# Patient Record
Sex: Female | Born: 1965 | Race: White | Hispanic: No | Marital: Married | Smoking: Former smoker
Health system: Southern US, Community
[De-identification: ages and names within clinical notes are randomized; demographics above are authoritative.]

## PROBLEM LIST (undated history)

## (undated) ENCOUNTER — Ambulatory Visit: Admission: EM | Payer: BC Managed Care – PPO | Source: Home / Self Care

## (undated) DIAGNOSIS — J309 Allergic rhinitis, unspecified: Secondary | ICD-10-CM

## (undated) DIAGNOSIS — G43909 Migraine, unspecified, not intractable, without status migrainosus: Secondary | ICD-10-CM

## (undated) DIAGNOSIS — C801 Malignant (primary) neoplasm, unspecified: Secondary | ICD-10-CM

## (undated) DIAGNOSIS — Z87442 Personal history of urinary calculi: Secondary | ICD-10-CM

## (undated) DIAGNOSIS — I10 Essential (primary) hypertension: Secondary | ICD-10-CM

## (undated) DIAGNOSIS — M109 Gout, unspecified: Secondary | ICD-10-CM

## (undated) DIAGNOSIS — K219 Gastro-esophageal reflux disease without esophagitis: Secondary | ICD-10-CM

## (undated) HISTORY — DX: Gout, unspecified: M10.9

## (undated) HISTORY — PX: PARTIAL HYSTERECTOMY: SHX80

## (undated) HISTORY — PX: TONSILECTOMY, ADENOIDECTOMY, BILATERAL MYRINGOTOMY AND TUBES: SHX2538

## (undated) HISTORY — PX: VESICOVAGINAL FISTULA CLOSURE W/ TAH: SUR271

## (undated) HISTORY — PX: EYE SURGERY: SHX253

## (undated) HISTORY — DX: Allergic rhinitis, unspecified: J30.9

## (undated) HISTORY — DX: Essential (primary) hypertension: I10

## (undated) HISTORY — PX: BREAST CYST EXCISION: SHX579

## (undated) HISTORY — DX: Gastro-esophageal reflux disease without esophagitis: K21.9

## (undated) HISTORY — DX: Migraine, unspecified, not intractable, without status migrainosus: G43.909

---

## 1996-07-09 HISTORY — PX: OTHER SURGICAL HISTORY: SHX169

## 2001-05-10 ENCOUNTER — Emergency Department (HOSPITAL_COMMUNITY): Admission: EM | Admit: 2001-05-10 | Discharge: 2001-05-10 | Payer: Self-pay | Admitting: *Deleted

## 2001-05-10 ENCOUNTER — Encounter: Payer: Self-pay | Admitting: *Deleted

## 2005-04-13 ENCOUNTER — Emergency Department (HOSPITAL_COMMUNITY): Admission: EM | Admit: 2005-04-13 | Discharge: 2005-04-13 | Payer: Self-pay | Admitting: Emergency Medicine

## 2005-11-08 ENCOUNTER — Ambulatory Visit (HOSPITAL_COMMUNITY): Admission: RE | Admit: 2005-11-08 | Discharge: 2005-11-08 | Payer: Self-pay | Admitting: Family Medicine

## 2006-01-25 ENCOUNTER — Ambulatory Visit (HOSPITAL_COMMUNITY): Admission: RE | Admit: 2006-01-25 | Discharge: 2006-01-25 | Payer: Self-pay | Admitting: General Surgery

## 2006-01-25 ENCOUNTER — Encounter (INDEPENDENT_AMBULATORY_CARE_PROVIDER_SITE_OTHER): Payer: Self-pay | Admitting: Specialist

## 2007-11-10 ENCOUNTER — Ambulatory Visit (HOSPITAL_COMMUNITY): Admission: RE | Admit: 2007-11-10 | Discharge: 2007-11-10 | Payer: Self-pay | Admitting: Nurse Practitioner

## 2007-11-17 ENCOUNTER — Ambulatory Visit (HOSPITAL_COMMUNITY): Admission: RE | Admit: 2007-11-17 | Discharge: 2007-11-17 | Payer: Self-pay | Admitting: Nurse Practitioner

## 2009-10-15 ENCOUNTER — Emergency Department (HOSPITAL_COMMUNITY): Admission: EM | Admit: 2009-10-15 | Discharge: 2009-10-15 | Payer: Self-pay | Admitting: Emergency Medicine

## 2010-03-08 ENCOUNTER — Ambulatory Visit (HOSPITAL_COMMUNITY): Admission: RE | Admit: 2010-03-08 | Discharge: 2010-03-08 | Payer: Self-pay | Admitting: Urology

## 2010-09-27 LAB — URINE CULTURE: Colony Count: NO GROWTH

## 2010-09-27 LAB — URINALYSIS, ROUTINE W REFLEX MICROSCOPIC
Glucose, UA: NEGATIVE mg/dL
Ketones, ur: NEGATIVE mg/dL
Nitrite: NEGATIVE
Protein, ur: NEGATIVE mg/dL

## 2010-09-27 LAB — WET PREP, GENITAL: Yeast Wet Prep HPF POC: NONE SEEN

## 2010-11-24 NOTE — Op Note (Signed)
NAMEJOCHEBED, BILLS               ACCOUNT NO.:  [id number]   MEDICAL RECORD NO.:  [id number]          PATIENT TYPE:  AMB   LOCATION:  DAY                           FACILITY:  [facility]   PHYSICIAN:  Dalia Heading, M.D.  DATE OF BIRTH:  [birth year]   DATE OF PROCEDURE:  01/25/2006  DATE OF DISCHARGE:                                 OPERATIVE REPORT   PREOPERATIVE DIAGNOSIS:  Thrombosed hemorrhoid.   POSTOPERATIVE DIAGNOSIS:  Thrombosed hemorrhoid.   PROCEDURE:  Internal and external hemorrhoidectomy.   SURGEON:  Dr. Franky Macho   ANESTHESIA:  Spinal.   INDICATIONS:  The patient is a 45 year old morbidly obese white female who  presents with a thrombosed internal and external hemorrhoid at the 7 o'clock  position.  The risks and benefits of procedure including bleeding,  infection, recurrence of hemorrhoidal disease were fully explained to the  patient, who gave informed consent.   PROCEDURE NOTE:  The patient was placed in the lithotomy position after  spinal anesthesia was administered.  The perineum was prepped and draped in  the usual sterile technique with Betadine.  Surgical site confirmation was  performed.   The patient was noted to have mild circumferential hemorrhoidal disease,  though a large thrombosed internal and external hemorrhoid at 7 o'clock  position was noted with skin excoriation.  The internal and external  hemorrhoid in this region was excised without difficulty.  Mucosal edges  were reapproximated using a 2-0 Vicryl running suture.  0.5 cm Sensorcaine  was instilled in the surrounding wound.  The excision site was inspected.  No abnormal bleeding was noted at the end of the procedure.  Surgicel and  viscous Xylocaine packing was then placed into the rectum.   All tape and needle counts correct at end of the procedure.  The patient was  transferred to PACU in stable condition.  Complications none.   SPECIMEN:  Hemorrhoids.   BLOOD LOSS:   Minimal.      Dalia Heading, M.D.  Electronically Signed     MAJ/MEDQ  D:  01/25/2006  T:  01/25/2006  Job:  [id number]   cc:   Ninfa Linden, M.D., Lewayne Bunting

## 2010-11-24 NOTE — H&P (Signed)
Joanna Reid, CROWELL               ACCOUNT NO.:  [id number]   MEDICAL RECORD NO.:  [id number]          PATIENT TYPE:  AMB   LOCATION:  DAY                           FACILITY:  [facility]   PHYSICIAN:  Dalia Heading, M.D.  DATE OF BIRTH:  05-23-1966   DATE OF ADMISSION:  DATE OF DISCHARGE:  LH                                HISTORY & PHYSICAL   CHIEF COMPLAINT:  Thrombosed hemorrhoids.   HISTORY OF PRESENT ILLNESS:  The patient is a 45 year old white female who  is referred for evaluation and treatment hemorrhoidal disease.  This has  been present for many months and medications have not been helpful.  She had  them during her pregnancy.  They are painful to touch.  No constipation or  diarrhea is noted.  Some blood is noted on the toilet paper when she wipes  herself.   PAST MEDICAL HISTORY:  Includes GERD, hypertension, non-insulin dependent  diabetes mellitus.   PAST SURGICAL HISTORY:  Hysterectomy, C-section.   CURRENT MEDICATIONS:  Omeprazole, Benicar, metformin, bumetanide, Byetta,  Simvastatin.   ALLERGIES:  NO KNOWN DRUG ALLERGIES.   REVIEW OF SYSTEMS:  Noncontributory.   PHYSICAL EXAMINATION:  GENERAL:  The patient is an obese white female in no acute distress.  LUNGS:  Clear to auscultation with equal breath sounds bilaterally.  HEART: Examination reveals regular rate and rhythm without S3, S4, or  murmurs.  RECTAL:  Examination reveals thrombosed hemorrhoids at the 7 o'clock  position.  Examination is limited secondary to pain.   IMPRESSION:  Thrombosed hemorrhoid.   PLAN:  The patient is scheduled for hemorrhoidectomy on January 25, 2006.  The  risks and benefits of the procedure including bleeding, infection,  recurrence of the hemorrhoids were fully explained to the patient who gave  informed consent.      Dalia Heading, M.D.  Electronically Signed     MAJ/MEDQ  D:  01/24/2006  T:  01/24/2006  Job:  [id number]   cc:   Ninfa Linden, M.D.

## 2010-12-04 ENCOUNTER — Emergency Department (HOSPITAL_COMMUNITY)
Admission: EM | Admit: 2010-12-04 | Discharge: 2010-12-04 | Disposition: A | Discharge status: Home / Self Care | Payer: BC Managed Care – PPO | Attending: Emergency Medicine | Admitting: Emergency Medicine

## 2010-12-04 ENCOUNTER — Emergency Department (HOSPITAL_COMMUNITY): Payer: BC Managed Care – PPO

## 2010-12-04 DIAGNOSIS — M542 Cervicalgia: Secondary | ICD-10-CM | POA: Insufficient documentation

## 2010-12-04 DIAGNOSIS — M1990 Unspecified osteoarthritis, unspecified site: Secondary | ICD-10-CM | POA: Insufficient documentation

## 2010-12-04 DIAGNOSIS — M436 Torticollis: Secondary | ICD-10-CM | POA: Insufficient documentation

## 2010-12-04 DIAGNOSIS — E119 Type 2 diabetes mellitus without complications: Secondary | ICD-10-CM | POA: Insufficient documentation

## 2010-12-04 DIAGNOSIS — M25519 Pain in unspecified shoulder: Secondary | ICD-10-CM | POA: Insufficient documentation

## 2010-12-04 DIAGNOSIS — I10 Essential (primary) hypertension: Secondary | ICD-10-CM | POA: Insufficient documentation

## 2011-09-19 ENCOUNTER — Encounter: Payer: Self-pay | Admitting: Orthopedic Surgery

## 2011-09-19 ENCOUNTER — Ambulatory Visit (INDEPENDENT_AMBULATORY_CARE_PROVIDER_SITE_OTHER): Payer: BC Managed Care – PPO | Admitting: Orthopedic Surgery

## 2011-09-19 VITALS — BP 120/80 | Ht 64.5 in | Wt 272.0 lb

## 2011-09-19 DIAGNOSIS — M722 Plantar fascial fibromatosis: Secondary | ICD-10-CM | POA: Insufficient documentation

## 2011-09-19 NOTE — Patient Instructions (Signed)
You have received a steroid shot. 15% of patients experience increased pain at the injection site with in the next 24 hours. This is best treated with ice and tylenol extra strength 2 tabs every 8 hours. If you are still having pain please call the office.    

## 2011-09-19 NOTE — Progress Notes (Signed)
X-ray report  3 views LEFT foot  Diagnosis plantar fasciitis, heel pain  Spurs are noted on the posterior calcaneus and the plantar portion of the calcaneus.  Degenerative changes in the midfoot.  Impression osteoarthritis midfoot and plantar spur and Achilles spur. Subjective:    Joanna Reid is a 46 y.o. female who presents with LEFT heel pain.  The patient reports "I cannot walk I might heal without experience significant pain  Pain started January of this year suddenly no trauma.  She's been taken ibuprofen, she has a night splint, she uses ice packs of brace and he'll stretching to no avail.  Pain is described as sharp stabbing and burning Pain score 8/10 Timing constant She has restart pain, pain after sitting There's some tingling and some swelling in the heel as well , primarily the pain is on the plantar aspect of the foot  The following portions of the patient's history were reviewed and updated as appropriate: allergies, current medications, past family history, past medical history, past social history, past surgical history and problem list.  Review of Systems A comprehensive review of systems was negative except for: cough    Objective:    BP 120/80  Ht 5' 4.5" (1.638 m)  Wt 272 lb (123.378 kg)  BMI 45.97 kg/m2  Vital signs are stable as recorded  General appearance is normal  The patient is alert and oriented x3  The patient's mood and affect are normal  Gait assessment: limp, left  The cardiovascular exam reveals normal pulses and temperature without edema swelling.  The lymphatic system is negative for palpable lymph nodes  The sensory exam is normal.  There are no pathologic reflexes.  Balance is normal.   Exam of the LEFT foot tenderness on the plantar aspect of the foot and slightly medially but no tenderness over the medial malleolus or posterior tibial tendon.  Range of motion is normal.  Plantar flexion dorsiflexion strength is normal.   Ankle joint is stable.  Achilles Tendon nontender.          Imaging: X-ray of the left foot: she has a plantar spur and an Achilles spur.  There is degenerative arthritis in the foot.    Assessment:    Plantar fasciitis    Plan:    Steroid injection to heel/ plantar fascia insertion performed. PT referral. continue night splint, continue plantar fascia strap.

## 2011-09-19 NOTE — Progress Notes (Signed)
Patient ID: Joanna Reid, female   DOB: Oct 31, 1965, 46 y.o.   MRN: [id number]   LEFT heel injection.  Diagnosis Plantar fasciitis LEFT foot  Verbal consent was obtained.  Timeout was taken  Lateral approach.  The retrocalcaneal bursa was injected.  Medications Depo-Medrol 40 mg per mL-one mL.  Lidocaine 1%-3 cc  Alcohol preparation, ethyl chloride, anesthesia.  The medication was injected without complication

## 2011-09-27 ENCOUNTER — Ambulatory Visit (HOSPITAL_COMMUNITY)
Admission: RE | Admit: 2011-09-27 | Discharge: 2011-09-27 | Disposition: A | Discharge status: Home / Self Care | Payer: BC Managed Care – PPO | Source: Ambulatory Visit | Attending: Orthopedic Surgery | Admitting: Orthopedic Surgery

## 2011-09-27 DIAGNOSIS — IMO0001 Reserved for inherently not codable concepts without codable children: Secondary | ICD-10-CM | POA: Insufficient documentation

## 2011-09-27 DIAGNOSIS — M25673 Stiffness of unspecified ankle, not elsewhere classified: Secondary | ICD-10-CM | POA: Insufficient documentation

## 2011-09-27 DIAGNOSIS — M25579 Pain in unspecified ankle and joints of unspecified foot: Secondary | ICD-10-CM | POA: Insufficient documentation

## 2011-09-27 DIAGNOSIS — M25676 Stiffness of unspecified foot, not elsewhere classified: Secondary | ICD-10-CM | POA: Insufficient documentation

## 2011-09-27 NOTE — Patient Instructions (Addendum)
HEP

## 2011-09-27 NOTE — Evaluation (Signed)
Physical Therapy Evaluation  Patient Details  Name: Joanna Reid MRN: [id number] Date of Birth: 07-07-1966  Today's Date: 09/27/2011 Time: 3244-0102 Time Calculation (min): 44 min  Visit#: 1  of 8   Re-eval: 10/27/11 Assessment Diagnosis: Plantar fascitis Prior Therapy: none  Past Medical History:  Past Medical History  Diagnosis Date  . HBP (high blood pressure)   . Diabetes mellitus    Past Surgical History:  Past Surgical History  Procedure Date  . Tonsilectomy, adenoidectomy, bilateral myringotomy and tubes   . Vesicovaginal fistula closure w/ tah   . Eye surgery     Subjective Symptoms/Limitations Symptoms: [name] states that she began having pain in her left heel since January.  The patient states that she recieved a shot and she recieved 20% relief of pain.  The pain is worse in the evening, it is difficult for her to come sit to stand and just gettng out of the bed is a task sometimes.  She is being referred for therapy  to try to relieve her pain.  She has a night sploing that she has been using since January.   How long can you sit comfortably?: no problem How long can you stand comfortably?: The patient is able to stand for as long as needs because stands on her right leg only.  She is only able to stand on her left leg for 15 minutes. How long can you walk comfortably?: The patient states that she is ambulating on the treadmill.  She walked for 1/4 mile and she was in significant pain.  She has pain almost immediately. Special Tests: She is able to sleep.   Pain Assessment Currently in Pain?: Yes Pain Score:   2 (Highest pain in the past week 25/10; least 2/10) Pain Location: Ankle Pain Orientation: Left Pain Type: Chronic pain Pain Onset: More than a month ago Pain Frequency: Constant Pain Relieving Factors: Pt uses ice and does stair stretch; rolls a frozen waterbottle on her heel.   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 Leisure: Hobbies-yes (Comment) Comments: working in gym now unable to do anything with her feet.  Cognition/Observation Cognition Overall Cognitive Status: Appears within functional limits for tasks assessed Observation/Other Assessments Observations:  (significant antalgic pain.)  Sensation/Coordination/Flexibility/Functional Tests Functional Tests Functional Tests: LEFS= 23/80 Functional Tests: Figure 8 R 57.5 ; L 58.3  Assessment LLE AROM (degrees) Left Ankle Dorsiflexion 0-20: 3  Left Ankle Plantar Flexion 0-45: 52  Left Ankle Inversion: 25  Left Ankle Eversion: 10  LLE Strength Left Ankle Dorsiflexion: 3-/5 Left Ankle Plantar Flexion: 4/5 Left Ankle Inversion: 5/5 Left Ankle Eversion: 5/5  Exercise/Treatments Mobility/Balance      Ankle Stretches Gastroc Stretch: 3 reps;30 seconds Aerobic Exercises   Machines for Strengthening   Ankle Plyometrics   Ankle Exercises - Standing   Ankle Exercises - Seated   Ankle Exercises - Supine   Ankle Exercises - Sidelying   Manual Therapy Manual Therapy: Massage Massage: prone to L gastrroc while on stretch  Physical Therapy Assessment and Plan PT Assessment and Plan Clinical Impression Statement: Pt with plantar fascitis who will benefit from skilled PT to return pt to prior functional level. Rehab Potential: Good Clinical Impairments Affecting Rehab Potential: stiffness, pain, decreased strength, difficulty walking PT Frequency: Min 2X/week PT Duration: 4 weeks PT Treatment/Interventions: Gait training;Therapeutic exercise;Other (comment) (  modalities as needed including iontophoresis w/ dexamethason) PT Plan: See for ex, massage and ionto if md okay's    Goals Home Exercise Program Pt will Perform Home Exercise Program: Independently PT Short Term Goals Time  to Complete Short Term Goals: 2 weeks PT Short Term Goal 1: Pt ROM to 5 degrees to allow normalized walking PT Short Term Goal 2: Pain level to be decreased by 4 levels. PT Long Term Goals Time to Complete Long Term Goals: 4 weeks PT Long Term Goal 1: ROM to be wnl to allow normal walking for 30 min without increase pain. PT Long Term Goal 2: Pain level to be no greater than a 2/10 Long Term Goal 3: I in advance HEP Long Term Goal 4: MM strength wnl to allow normal ADL without increase pain. PT Long Term Goal 5: increase LEFS by 15  Problem List Patient Active Problem List  Diagnoses  . Plantar fasciitis of left foot    PT - End of Session Activity Tolerance: Patient tolerated treatment well General Behavior During Session: [facility] for tasks performed Cognition: [facility] for tasks performed PT Plan of Care Consulted and Agree with Plan of Care: Patient    Dellis Voght,CINDY 09/27/2011, 1:35 PM  Physician Documentation Your signature is required to indicate approval of the treatment plan as stated above.  Please sign and either send electronically or make a copy of this report for your files and return this physician signed original.   Please mark one 1.__approve of plan  2. ___approve of plan with the following conditions.   ______________________________                                                          _____________________ Physician Signature                                                                                                             Date

## 2011-10-03 ENCOUNTER — Ambulatory Visit (HOSPITAL_COMMUNITY)
Admission: RE | Admit: 2011-10-03 | Discharge: 2011-10-03 | Disposition: A | Discharge status: Home / Self Care | Payer: BC Managed Care – PPO | Source: Ambulatory Visit | Attending: Orthopedic Surgery | Admitting: Orthopedic Surgery

## 2011-10-03 NOTE — Progress Notes (Signed)
Physical Therapy Treatment Patient Details  Name: ANISAH KUCK MRN: [id number] Date of Birth: [birth year]  Today's Date: 10/03/2011 Time: 2841-3244 Time Calculation (min): 38 min Visit#: 2  of 8   Re-eval: 10/26/11 Charges:  Therex 8' , ultrasound 8', massage 12'    Subjective: Symptoms/Limitations Symptoms: Pt. states it is feeling better already from the new exercises/stretches instructed last visit. Currently with 2/10 with weight bearing.  Signed order not received from MD yet, refaxed request for ionto. Pain Assessment Currently in Pain?: Yes Pain Score:   2 Pain Location: Foot Pain Orientation: Left   Exercise/Treatments Ankle Stretches Plantar Fascia Stretch: 3 reps;30 seconds Gastroc Stretch: 3 reps;30 seconds   Modalities Modalities: Ultrasound Manual Therapy Manual Therapy: Massage Massage: Prone to L plantar aspect/medial foot X 10' with gastroc stretch Ultrasound Ultrasound Location: L plantar aspect/medial foot Ultrasound Parameters: 1.2 w/cm2, pulsed X 8 minutes. Ultrasound Goals: Pain  Physical Therapy Assessment and Plan PT Assessment and Plan Clinical Impression Statement: Added ultrasound to promote circulation/decrease pain prior to massage.  Pt. reported pain reduction to 2.5/10 after session.  Pt. required verbal cues and demonstration to perform stretches correctly. PT Plan: Begin ionto next visit if order received; continue to reduce pain.     Problem List Patient Active Problem List  Diagnoses  . Plantar fasciitis of left foot    PT - End of Session Activity Tolerance: Patient tolerated treatment well General Behavior During Session: [facility] for tasks performed Cognition: [facility] for tasks performed    Ramzey Petrovic B. Bascom Levels, PTA 10/03/2011, 4:09 PM

## 2011-10-04 ENCOUNTER — Ambulatory Visit (HOSPITAL_COMMUNITY)
Admission: RE | Admit: 2011-10-04 | Discharge: 2011-10-04 | Disposition: A | Discharge status: Home / Self Care | Payer: BC Managed Care – PPO | Source: Ambulatory Visit | Attending: Nurse Practitioner | Admitting: Nurse Practitioner

## 2011-10-04 NOTE — Progress Notes (Addendum)
Physical Therapy Treatment Patient Details  Name: Joanna Reid MRN: [id number] Date of Birth: [birth year]  Today's Date: 10/04/2011 Time: 0810-0900 therex and massage  [id number] ionto treatment Time Calculation (min): 50 min am, 25 min pm Visit#: 3  of 8   Re-eval: 10/26/11 Charges:  therex 8', manual 10', ionto X 1 unit    Subjective: Symptoms/Limitations Symptoms: Pt. states the last treatment flared it up a little; Still waiting for ionto order.  Currently 2/10 with weight bearing activities. Pt. Came back in in PM to receive ionto treatment after order received.   Exercises/massage Instructed/performed by Trilby Leaver, SPTA under the direction of Linsay Vogt Bascom Levels, PTA/CI. Exercise/Treatments Ankle Stretches gastroc stretch 3X30" Plantar fascia stretch 3X30"     Modalities Modalities: Iontophoresis Manual Therapy Manual Therapy: Massage Massage: prone to L achilles tendon/heel X 10 minutes with foot held into gastroc stretch Iontophoresis Type of Iontophoresis: Dexamethasone Location: L plantar aspect/medial foot Dose: 2cc dexamethasone Time: 24'  Physical Therapy Assessment and Plan PT Assessment and Plan Clinical Impression Statement: Focused massage into achilles/gastroc area today with better results.  Pt. to return in pm if orders received for iontophoresis for treatment.  Pt. instructed with a "rolling" technique she can do at home to stretch gastroc muscle. PT Plan: Continue current POC.     Problem List Patient Active Problem List  Diagnoses  . Plantar fasciitis of left foot    PT - End of Session Activity Tolerance: Patient tolerated treatment well General Behavior During Session: [facility] for tasks performed Cognition: [facility] for tasks performed   Trilby Leaver, SPTA/ Neftali Abair B. Bascom Levels, PTA 10/04/2011, 5:12 PM

## 2011-10-08 ENCOUNTER — Ambulatory Visit (HOSPITAL_COMMUNITY)
Admission: RE | Admit: 2011-10-08 | Discharge: 2011-10-08 | Disposition: A | Discharge status: Home / Self Care | Payer: BC Managed Care – PPO | Source: Ambulatory Visit | Attending: Nurse Practitioner | Admitting: Nurse Practitioner

## 2011-10-08 DIAGNOSIS — IMO0001 Reserved for inherently not codable concepts without codable children: Secondary | ICD-10-CM | POA: Insufficient documentation

## 2011-10-08 DIAGNOSIS — M25579 Pain in unspecified ankle and joints of unspecified foot: Secondary | ICD-10-CM | POA: Insufficient documentation

## 2011-10-08 DIAGNOSIS — M25673 Stiffness of unspecified ankle, not elsewhere classified: Secondary | ICD-10-CM | POA: Insufficient documentation

## 2011-10-08 DIAGNOSIS — M25676 Stiffness of unspecified foot, not elsewhere classified: Secondary | ICD-10-CM | POA: Insufficient documentation

## 2011-10-08 NOTE — Progress Notes (Signed)
Physical Therapy Treatment Patient Details  Name: Joanna Reid MRN: [id number] Date of Birth: [birth year]  Today's Date: 10/08/2011 Time: 0805-0900 Time Calculation (min): 55 min Visit#: 4  of 8   Re-eval: 10/26/11 Charges: Therex x 8' Manual x 15' Ionto x 20'  Subjective: Symptoms/Limitations Symptoms: Pt states my foot feel a lot better. Pain Assessment Currently in Pain?: Yes Pain Score:   1 Pain Location: Foot Pain Orientation: Left   Exercise/Treatments Ankle Stretches Plantar Fascia Stretch: 3 reps;30 seconds Slant Board Stretch: 3 reps;30 seconds  Modalities Modalities: Iontophoresis Manual Therapy Manual Therapy: Massage Massage: Prone to L achilles tendon/heel X 15 minutes with foot held into gastroc stretch  Iontophoresis Type of Iontophoresis: Dexamethasone Location: L posterior heel Dose: 2cc dexamethasone Time: 38'  Physical Therapy Assessment and Plan PT Assessment and Plan Clinical Impression Statement: Pt 's pain appears to be decreasing. Multiple spasms and adhesions noted with manual. Began slant board stretch to increase gastroc flexibility. Ionto applied to posterior heel this session as this is where pt is having most of her pain. PT Plan: Continue current POC.     Problem List Patient Active Problem List  Diagnoses  . Plantar fasciitis of left foot    PT - End of Session Activity Tolerance: Patient tolerated treatment well General Behavior During Session: [facility] for tasks performed Cognition: [facility] for tasks performed  Seth Bake, PTA 10/08/2011, 10:44 AM

## 2011-10-09 ENCOUNTER — Ambulatory Visit (HOSPITAL_COMMUNITY)
Admission: RE | Admit: 2011-10-09 | Discharge: 2011-10-09 | Disposition: A | Discharge status: Home / Self Care | Payer: BC Managed Care – PPO | Source: Ambulatory Visit | Attending: Nurse Practitioner | Admitting: Nurse Practitioner

## 2011-10-09 NOTE — Progress Notes (Signed)
Physical Therapy Treatment Patient Details  Name: Joanna Reid MRN: [id number] Date of Birth: 1966-07-03  Today's Date: 10/09/2011 Time: 9147-8295 Time Calculation (min): 77 min Visit#: 5  of 8   Re-eval: 10/26/11 Charges: Therex x 8' Manual x 18' Ionto x 23'  Subjective: Symptoms/Limitations Symptoms: It felt much better after last session. Pain Assessment Currently in Pain?: Yes Pain Score:   1 Pain Location: Foot Pain Orientation: Left   Exercise/Treatments Ankle Stretches Plantar Fascia Stretch: 3 reps;30 seconds Slant Board Stretch: 3 reps;30 seconds    Modalities Modalities: Iontophoresis Manual Therapy Manual Therapy: Massage Massage: Prone to L achilles tendon/heel X 18 minutes with foot held into gastroc stretch Iontophoresis Type of Iontophoresis: Dexamethasone Location: L posterior heel Dose: 2cc dexamethasone Time: 20'  Physical Therapy Assessment and Plan PT Assessment and Plan Clinical Impression Statement: Pt completes stretches without difficulty. Adhesions in plantar fascia appear to have decreased compared to last session. Pt continues to have more sensitivity and pain on posterior heel. Pt reports pain decrease to 0/10 at end of session. PT Plan: Continue to progress per PT POC.     Problem List Patient Active Problem List  Diagnoses  . Plantar fasciitis of left foot    PT - End of Session Activity Tolerance: Patient tolerated treatment well General Behavior During Session: [facility] for tasks performed Cognition: [facility] for tasks performed   Seth Bake, PTA 10/09/2011, 4:57 PM

## 2011-10-16 ENCOUNTER — Ambulatory Visit (HOSPITAL_COMMUNITY): Payer: BC Managed Care – PPO | Admitting: *Deleted

## 2011-10-18 ENCOUNTER — Ambulatory Visit (HOSPITAL_COMMUNITY)
Admission: RE | Admit: 2011-10-18 | Discharge: 2011-10-18 | Disposition: A | Discharge status: Home / Self Care | Payer: BC Managed Care – PPO | Source: Ambulatory Visit | Attending: Nurse Practitioner | Admitting: Nurse Practitioner

## 2011-10-18 NOTE — Progress Notes (Signed)
Physical Therapy Treatment Patient Details  Name: Joanna Reid MRN: [id number] Date of Birth: 03-27-1966  Today's Date: 10/18/2011 Time: 4696-2952 Time Calculation (min): 67 min Visit#: 6  of 8   Re-eval: 10/26/11 Charges:  ionto 23', manual 15, therex 8'    Subjective: Symptoms/Limitations Symptoms: Pt. reports she is doing really well. Pain Assessment Currently in Pain?: Yes Pain Score:   1 Pain Location: Foot Pain Orientation: Left  Exercises Instructed by Trilby Leaver, SPTA under the direction of Maysel Mccolm Bascom Levels, PTA/CI. Exercise/Treatments Ankle Stretches Plantar Fascia Stretch: 3 reps;30 seconds Slant Board Stretch: 3 reps;30 seconds    Modalities Modalities: Iontophoresis Manual Therapy Manual Therapy: Massage Massage: Prone to L achilles tendon/heel X 18 minutes with foot held into gastroc stretch  Ultrasound Ultrasound Goals: Edema Iontophoresis Type of Iontophoresis: Dexamethasone Location: L Medial Arch Dose: 2cc dexamethasone Time: 66'  Physical Therapy Assessment and Plan PT Assessment and Plan Clinical Impression Statement: Pt with decreasing sensitivity and adhesions; responding well to treatment. PT Plan: Continue X 2 more visits.     Problem List Patient Active Problem List  Diagnoses  . Plantar fasciitis of left foot    PT - End of Session Activity Tolerance: Patient tolerated treatment well General Behavior During Session: [facility] for tasks performed Cognition: [facility] for tasks performed   Trilby Leaver, SPTA/ Shaughn Thomley B. Bascom Levels, PTA 10/18/2011, 5:58 PM

## 2011-10-23 ENCOUNTER — Ambulatory Visit (HOSPITAL_COMMUNITY): Payer: BC Managed Care – PPO | Admitting: *Deleted

## 2011-10-25 ENCOUNTER — Ambulatory Visit (HOSPITAL_COMMUNITY): Payer: BC Managed Care – PPO | Admitting: *Deleted

## 2011-10-29 ENCOUNTER — Ambulatory Visit (HOSPITAL_COMMUNITY)
Admission: RE | Admit: 2011-10-29 | Discharge: 2011-10-29 | Disposition: A | Discharge status: Home / Self Care | Payer: BC Managed Care – PPO | Source: Ambulatory Visit | Attending: Nurse Practitioner | Admitting: Nurse Practitioner

## 2011-10-29 NOTE — Progress Notes (Signed)
Physical Therapy Treatment Patient Details  Name: Joanna Reid MRN: [id number] Date of Birth: 03-13-1966  Today's Date: 10/29/2011 Time: 1108-1220 Time Calculation (min): 72 min Visit#: 7  of 8   Charges: MMT x 1 ROMM x 1 Manual x 10' Ionto x 23'  Subjective: Symptoms/Limitations Symptoms: Pt states that it was feeling better but she worked all weekend and now it is tender. Pain Assessment Currently in Pain?: Yes Pain Score:   5 Pain Location: Foot Pain Orientation: Left  Objective:  10/29/11 1122  Functional Tests  Functional Tests LEFS= 56/80  LLE AROM (degrees)  Left Ankle Dorsiflexion 0-20 10   Left Ankle Plantar Flexion 0-45 50   Left Ankle Inversion 20   Left Ankle Eversion 9   LLE Strength  Left Ankle Dorsiflexion 5/5  Left Ankle Plantar Flexion 4/5  Left Ankle Inversion 5/5  Left Ankle Eversion 5/5   Exercise/Treatments Ankle Stretches Slant Board Stretch: 3 reps;30 seconds  Physical Therapy Assessment and Plan PT Assessment and Plan Clinical Impression Statement: Pt tolerates tx well. Pt has made great improvements since initial eval. Pt would like to d/c from PT to continue HEP at home.  PT Plan: Recommend to PT to  D/C to HEP.    Goals Home Exercise Program Pt will Perform Home Exercise Program: Independently PT Short Term Goals Time to Complete Short Term Goals: 2 weeks PT Short Term Goal 1: Pt ROM to 5 degrees to allow normalized walking PT Short Term Goal 1 - Progress: Progressing toward goal PT Short Term Goal 2: Pain level to be decreased by 4 levels. PT Short Term Goal 2 - Progress: Met PT Long Term Goals Time to Complete Long Term Goals: 4 weeks PT Long Term Goal 1: ROM to be wnl to allow normal walking for 30 min without increase pain. PT Long Term Goal 2: Pain level to be no greater than a 2/10 PT Long Term Goal 2 - Progress: Progressing toward goal Long Term Goal 3: I in advance HEP Long Term Goal 3 Progress: Met Long Term Goal 4: MM  strength wnl to allow normal ADL without increase pain. Long Term Goal 4 Progress: Met Long Term Goal 5 Progress: Met  Problem List Patient Active Problem List  Diagnoses  . Plantar fasciitis of left foot    PT - End of Session Activity Tolerance: Patient tolerated treatment well General Behavior During Session: [facility] for tasks performed Cognition: [facility] for tasks performed   Seth Bake, PTA 10/29/2011, 12:21 PM

## 2011-10-31 ENCOUNTER — Ambulatory Visit (HOSPITAL_COMMUNITY): Payer: BC Managed Care – PPO | Admitting: Physical Therapy

## 2011-11-05 ENCOUNTER — Ambulatory Visit (HOSPITAL_COMMUNITY): Payer: BC Managed Care – PPO | Admitting: Physical Therapy

## 2012-05-06 ENCOUNTER — Other Ambulatory Visit: Payer: Self-pay | Admitting: Obstetrics & Gynecology

## 2012-05-06 NOTE — Patient Instructions (Addendum)
20 ZAKYRA KUKUK  05/06/2012   Your procedure is scheduled on:   05/14/2012   Report to [facility] at  730  AM.  Call this number if you have problems the morning of surgery: [phone]   Remember:   Do not eat food:After Midnight.  May have clear liquids:until Midnight .   Take these medicines the morning of surgery with A SIP OF WATER:  Allopurinol,norvasc,cozaar,prilosec   Do not wear jewelry, make-up or nail polish.  Do not wear lotions, powders, or perfumes. You may wear deodorant.  Do not shave 48 hours prior to surgery. Men may shave face and neck.  Do not bring valuables to the hospital.  Contacts, dentures or bridgework may not be worn into surgery.  Leave suitcase in the car. After surgery it may be brought to your room.  For patients admitted to the hospital, checkout time is 11:00 AM the day of discharge.   Patients discharged the day of surgery will not be allowed to drive home.  Name and phone number of your driver: family  Special Instructions: Shower using CHG 2 nights before surgery and the night before surgery.  If you shower the day of surgery use CHG.  Use special wash - you have one bottle of CHG for all showers.  You should use approximately 1/3 of the bottle for each shower.   Please read over the following fact sheets that you were given: Pain Booklet, Coughing and Deep Breathing, MRSA Information, Surgical Site Infection Prevention, Anesthesia Post-op Instructions and Care and Recovery After Surgery Unilateral Salpingo-Oophorectomy Unilateral salpingo-oophorectomy is the removal of one fallopian tube and ovary. The fallopian tubes transport the egg from the ovary to the womb (uterus). The fallopian tube is also where the sperm and egg meet and become fertilized and move down into the uterus.  Removing one tube and ovary will not:  Cause problems with your menstrual periods.  Cause problems with your sex drive (libido).  Put you into the  menopause.  Give symptoms of the menopause.  Make you not able to get pregnant (sterile). There are several reasons for doing a salpingo-oophorectomy:  Infection of the tube and ovary.  There may be scar tissue of the tube and ovary (adhesions).  It may be necessary to remove the tube when the ovary has a cyst or tumor.  It may have to be done when removing the uterus.  It may have to be done when there is cancer of the tube or ovary. LET YOUR CAREGIVER KNOW ABOUT:  Allergies to food or medications.  All the medications you are taking including prescription and over-the-counter herbs, eye drops and creams.  If you are using illegal drugs or excessive alcohol.  Your smoking habits.  Previous problems with anesthesia including numbing medication.  The possibility of being pregnant.  History of blood clots or bleeding problems.  Previous surgery.  Any other medical or health problems. RISKS AND COMPLICATIONS  All surgery is associated with risks. Some of these risks are:  Injury to surrounding organs.  Bleeding.  Infection.  Blood clots in the legs or lungs.  Problems with the anesthesia.  The surgery does not help the problem.  Death. BEFORE THE PROCEDURE  Do not take aspirin or blood thinners because it can make you bleed.  Do not eat or drink anything at least 8 hours before the surgery.  Let your caregiver know if you develop a cold or an infection.  If  you are being admitted the day of surgery, arrive at least one hour before the surgery.  Arrange for help when you go home from the hospital.  If you smoke, do not smoke for at least 2 weeks before the surgery. PROCEDURE After being admitted to the hospital, you will change into a hospital gown. Then, you will be given an IV (intravenous) and a medication to relax you. Then, you will be put to sleep with an anesthetic. Any hair on your lower belly (abdomen) will be removed, and a catheter will be  placed in your bladder. The fallopian tube and ovary will be removed either through 2 very small cuts (incisions) or through large incision in the lower abdomen. The blood vessels will be clamped and tied. AFTER THE PROCEDURE  You will be taken to the recovery room for 1 to 3 hours until your blood pressure, pulse and temperature are stable and you are waking up.  If you had a laparoscopy, you may be discharged in several hours.  If you had a large incision, you will be admitted to the hospital for a day or two.  If you had a laparoscopy, you may have shoulder pain. This is not unusual. It is from air that is left in the abdomen and affects the nerve that goes from the diaphragm to the shoulder. It goes away in a day or two.  You will be given pain medication as necessary.  The intravenous and catheter will be removed before you are discharged.  Have someone available to take you home. HOME CARE INSTRUCTIONS   It is normal to be sore for a week or two. Call your caregiver if the pain is getting worse or the pain medication is not helping.  Have help when you go home for a week or so to help with the household chores.  Follow your caregiver's advice regarding diet.  Get rest and sleep.  Only take over-the-counter or prescription medicines for pain or discomfort as directed by your caregiver.  Do not take aspirin. It can cause bleeding.  Do not drive, exercise or lift anything over 5 pounds.  Do not drink alcohol until your caregiver gives you permission.  Do not lift anything over 5 pounds.  Do not have sexual intercourse until your caregiver says it is OK.  Take your temperature twice a day and write it down.  Change the bandage (dressing) as directed.  Make and keep your follow-up appointments for postoperative care.  If you become constipated, ask your caregiver about taking a mild laxative. Drinking more liquids than usual and eating bran foods can help prevent  constipation. SEEK MEDICAL CARE IF:   You have swelling or redness around the cut (incision).  You develop a rash.  You have side effects from the medication.  You feel lightheaded.  You need more or stronger medication.  You have pain, swelling or redness where the IV (intravenous) was placed. SEEK IMMEDIATE MEDICAL CARE IF:   You develop an unexplained temperature above 100 F (37.8 C).  You develop increasing belly (abdominal) pain.  You have pus coming out of the incision.  You notice a bad smell coming from the wound or dressing.  The incision is separating.  There is excessive vaginal bleeding.  You start to feel sick to your stomach (nauseous) and vomit.  You have leg or chest pain.  You have pain when you urinate.  You develop shortness of breath.  You pass out. Document Released: 04/22/2009  Document Revised: 09/17/2011 Document Reviewed: 04/22/2009 [facility] Patient Information 2013 [place], [place]. PATIENT INSTRUCTIONS POST-ANESTHESIA  IMMEDIATELY FOLLOWING SURGERY:  Do not drive or operate machinery for the first twenty four hours after surgery.  Do not make any important decisions for twenty four hours after surgery or while taking narcotic pain medications or sedatives.  If you develop intractable nausea and vomiting or a severe headache please notify your doctor immediately.  FOLLOW-UP:  Please make an appointment with your surgeon as instructed. You do not need to follow up with anesthesia unless specifically instructed to do so.  WOUND CARE INSTRUCTIONS (if applicable):  Keep a dry clean dressing on the anesthesia/puncture wound site if there is drainage.  Once the wound has quit draining you may leave it open to air.  Generally you should leave the bandage intact for twenty four hours unless there is drainage.  If the epidural site drains for more than 36-48 hours please call the anesthesia department.  QUESTIONS?:  Please feel free to call your  physician or the hospital operator if you have any questions, and they will be happy to assist you.

## 2012-05-07 ENCOUNTER — Other Ambulatory Visit (HOSPITAL_COMMUNITY): Payer: Self-pay

## 2012-05-07 ENCOUNTER — Encounter (HOSPITAL_COMMUNITY): Payer: Self-pay | Admitting: Pharmacy Technician

## 2012-05-07 ENCOUNTER — Encounter (HOSPITAL_COMMUNITY)
Admission: RE | Admit: 2012-05-07 | Discharge: 2012-05-07 | Disposition: A | Discharge status: Home / Self Care | Payer: BC Managed Care – PPO | Source: Ambulatory Visit | Attending: Obstetrics & Gynecology | Admitting: Obstetrics & Gynecology

## 2012-05-07 ENCOUNTER — Encounter (HOSPITAL_COMMUNITY): Payer: Self-pay

## 2012-05-07 LAB — SURGICAL PCR SCREEN
MRSA, PCR: NEGATIVE
Staphylococcus aureus: NEGATIVE

## 2012-05-07 LAB — CBC
HCT: 36.1 % (ref 36.0–46.0)
Hemoglobin: 12.2 g/dL (ref 12.0–15.0)
RBC: 4.25 MIL/uL (ref 3.87–5.11)
WBC: 7.5 10*3/uL (ref 4.0–10.5)

## 2012-05-07 LAB — URINALYSIS, ROUTINE W REFLEX MICROSCOPIC
Bilirubin Urine: NEGATIVE
Ketones, ur: NEGATIVE mg/dL
Specific Gravity, Urine: 1.02 (ref 1.005–1.030)
Urobilinogen, UA: 0.2 mg/dL (ref 0.0–1.0)

## 2012-05-07 LAB — HCG, QUANTITATIVE, PREGNANCY: hCG, Beta Chain, Quant, S: <1 m[IU]/mL (ref <5)

## 2012-05-07 LAB — COMPREHENSIVE METABOLIC PANEL
ALT: 17 U/L (ref 0–35)
Alkaline Phosphatase: 70 U/L (ref 39–117)
BUN: 19 mg/dL (ref 6–23)
CO2: 24 mEq/L (ref 19–32)
Chloride: 105 mEq/L (ref 96–112)
GFR calc Af Amer: >90 mL/min (ref >90)
GFR calc non Af Amer: 86 mL/min — ABNORMAL LOW (ref >90)
Glucose, Bld: 134 mg/dL — ABNORMAL HIGH (ref 70–99)
Potassium: 3.5 mEq/L (ref 3.5–5.1)
Sodium: 140 mEq/L (ref 135–145)
Total Bilirubin: 0.4 mg/dL (ref 0.3–1.2)
Total Protein: 6.9 g/dL (ref 6.0–8.3)

## 2012-05-07 LAB — URINE MICROSCOPIC-ADD ON

## 2012-05-10 LAB — URINE CULTURE

## 2012-05-14 ENCOUNTER — Ambulatory Visit (HOSPITAL_COMMUNITY)
Admission: RE | Admit: 2012-05-14 | Discharge: 2012-05-14 | Disposition: A | Discharge status: Home / Self Care | Payer: BC Managed Care – PPO | Source: Ambulatory Visit | Attending: Obstetrics & Gynecology | Admitting: Obstetrics & Gynecology

## 2012-05-14 ENCOUNTER — Encounter (HOSPITAL_COMMUNITY): Payer: Self-pay | Admitting: *Deleted

## 2012-05-14 ENCOUNTER — Ambulatory Visit (HOSPITAL_COMMUNITY): Payer: BC Managed Care – PPO | Admitting: Anesthesiology

## 2012-05-14 ENCOUNTER — Encounter (HOSPITAL_COMMUNITY)
Admission: RE | Disposition: A | Discharge status: Home / Self Care | Payer: Self-pay | Source: Ambulatory Visit | Attending: Obstetrics & Gynecology

## 2012-05-14 ENCOUNTER — Encounter (HOSPITAL_COMMUNITY): Payer: Self-pay | Admitting: Anesthesiology

## 2012-05-14 ENCOUNTER — Ambulatory Visit: Admit: 2012-05-14 | Payer: Self-pay | Admitting: Obstetrics & Gynecology

## 2012-05-14 DIAGNOSIS — Z01812 Encounter for preprocedural laboratory examination: Secondary | ICD-10-CM | POA: Insufficient documentation

## 2012-05-14 DIAGNOSIS — Z90721 Acquired absence of ovaries, unilateral: Secondary | ICD-10-CM

## 2012-05-14 DIAGNOSIS — N839 Noninflammatory disorder of ovary, fallopian tube and broad ligament, unspecified: Secondary | ICD-10-CM | POA: Insufficient documentation

## 2012-05-14 DIAGNOSIS — D279 Benign neoplasm of unspecified ovary: Secondary | ICD-10-CM | POA: Insufficient documentation

## 2012-05-14 DIAGNOSIS — E119 Type 2 diabetes mellitus without complications: Secondary | ICD-10-CM | POA: Insufficient documentation

## 2012-05-14 DIAGNOSIS — I10 Essential (primary) hypertension: Secondary | ICD-10-CM | POA: Insufficient documentation

## 2012-05-14 HISTORY — PX: LAPAROSCOPIC UNILATERAL SALPINGO OOPHERECTOMY: SHX5935

## 2012-05-14 SURGERY — SALPINGO-OOPHORECTOMY, LAPAROSCOPIC
Anesthesia: General | Site: Vagina | Laterality: Right

## 2012-05-14 SURGERY — SALPINGO-OOPHORECTOMY, UNILATERAL, LAPAROSCOPIC
Anesthesia: General | Laterality: Right | Wound class: Clean

## 2012-05-14 MED ORDER — FENTANYL CITRATE 0.05 MG/ML IJ SOLN
INTRAMUSCULAR | Status: AC
  Filled 2012-05-14: qty 2

## 2012-05-14 MED ORDER — LIDOCAINE HCL (PF) 1 % IJ SOLN
INTRAMUSCULAR | Status: AC
  Filled 2012-05-14: qty 5

## 2012-05-14 MED ORDER — MIDAZOLAM HCL 5 MG/5ML IJ SOLN
INTRAMUSCULAR | Status: DC | PRN
  Administered 2012-05-14: 2 mg via INTRAVENOUS

## 2012-05-14 MED ORDER — MIDAZOLAM HCL 2 MG/2ML IJ SOLN
1.0000 mg | INTRAMUSCULAR | Status: DC | PRN
  Administered 2012-05-14: 2 mg via INTRAVENOUS

## 2012-05-14 MED ORDER — ROCURONIUM BROMIDE 50 MG/5ML IV SOLN
INTRAVENOUS | Status: AC
  Filled 2012-05-14: qty 1

## 2012-05-14 MED ORDER — ONDANSETRON HCL 8 MG PO TABS: 8.0000 mg | ORAL_TABLET | Freq: Three times a day (TID) | ORAL | Status: DC | PRN

## 2012-05-14 MED ORDER — HYDROCODONE-ACETAMINOPHEN 5-500 MG PO TABS: 1.0000 | ORAL_TABLET | Freq: Four times a day (QID) | ORAL | Status: DC | PRN

## 2012-05-14 MED ORDER — KETOROLAC TROMETHAMINE 30 MG/ML IJ SOLN
30.0000 mg | Freq: Once | INTRAMUSCULAR | Status: AC
  Administered 2012-05-14: 30 mg via INTRAVENOUS

## 2012-05-14 MED ORDER — FENTANYL CITRATE 0.05 MG/ML IJ SOLN
INTRAMUSCULAR | Status: DC | PRN
  Administered 2012-05-14: 100 ug via INTRAVENOUS

## 2012-05-14 MED ORDER — LIDOCAINE HCL (CARDIAC) 10 MG/ML IV SOLN
INTRAVENOUS | Status: DC | PRN
  Administered 2012-05-14: 20 mg via INTRAVENOUS

## 2012-05-14 MED ORDER — KETOROLAC TROMETHAMINE 30 MG/ML IJ SOLN
INTRAMUSCULAR | Status: AC
  Filled 2012-05-14: qty 1

## 2012-05-14 MED ORDER — ROCURONIUM BROMIDE 100 MG/10ML IV SOLN
INTRAVENOUS | Status: DC | PRN
  Administered 2012-05-14: 50 mg via INTRAVENOUS

## 2012-05-14 MED ORDER — NEOSTIGMINE METHYLSULFATE 1 MG/ML IJ SOLN
INTRAMUSCULAR | Status: DC | PRN
  Administered 2012-05-14: 2 mg via INTRAVENOUS

## 2012-05-14 MED ORDER — CEFAZOLIN SODIUM-DEXTROSE 2-3 GM-% IV SOLR
2.0000 g | INTRAVENOUS | Status: AC
  Administered 2012-05-14: 2 g via INTRAVENOUS

## 2012-05-14 MED ORDER — SODIUM CHLORIDE 0.9 % IR SOLN
Status: DC | PRN
  Administered 2012-05-14: 3000 mL

## 2012-05-14 MED ORDER — FENTANYL CITRATE 0.05 MG/ML IJ SOLN
25.0000 ug | INTRAMUSCULAR | Status: DC | PRN
  Administered 2012-05-14: 50 ug via INTRAVENOUS

## 2012-05-14 MED ORDER — ONDANSETRON HCL 4 MG/2ML IJ SOLN
INTRAMUSCULAR | Status: DC | PRN
  Administered 2012-05-14: 4 mg via INTRAVENOUS

## 2012-05-14 MED ORDER — 0.9 % SODIUM CHLORIDE (POUR BTL) OPTIME
TOPICAL | Status: DC | PRN
  Administered 2012-05-14: 1000 mL

## 2012-05-14 MED ORDER — GLYCOPYRROLATE 0.2 MG/ML IJ SOLN
INTRAMUSCULAR | Status: AC
  Filled 2012-05-14: qty 2

## 2012-05-14 MED ORDER — ONDANSETRON HCL 4 MG/2ML IJ SOLN: 4.0000 mg | Freq: Once | INTRAMUSCULAR | Status: DC | PRN

## 2012-05-14 MED ORDER — PROPOFOL 10 MG/ML IV BOLUS
INTRAVENOUS | Status: DC | PRN
  Administered 2012-05-14: 150 mg via INTRAVENOUS
  Administered 2012-05-14: 30 mg via INTRAVENOUS

## 2012-05-14 MED ORDER — CEFAZOLIN SODIUM-DEXTROSE 2-3 GM-% IV SOLR
INTRAVENOUS | Status: AC
  Filled 2012-05-14: qty 50

## 2012-05-14 MED ORDER — ONDANSETRON HCL 4 MG/2ML IJ SOLN
INTRAMUSCULAR | Status: AC
  Filled 2012-05-14: qty 2

## 2012-05-14 MED ORDER — KETOROLAC TROMETHAMINE 10 MG PO TABS: 10.0000 mg | ORAL_TABLET | Freq: Three times a day (TID) | ORAL | Status: DC | PRN

## 2012-05-14 MED ORDER — LACTATED RINGERS IV SOLN
INTRAVENOUS | Status: DC
  Administered 2012-05-14: 1000 mL via INTRAVENOUS

## 2012-05-14 MED ORDER — PROPOFOL 10 MG/ML IV EMUL
INTRAVENOUS | Status: AC
  Filled 2012-05-14: qty 20

## 2012-05-14 MED ORDER — GLYCOPYRROLATE 0.2 MG/ML IJ SOLN
INTRAMUSCULAR | Status: DC | PRN
  Administered 2012-05-14: 0.4 mg via INTRAVENOUS

## 2012-05-14 MED ORDER — MIDAZOLAM HCL 2 MG/2ML IJ SOLN
INTRAMUSCULAR | Status: AC
  Filled 2012-05-14: qty 2

## 2012-05-14 SURGICAL SUPPLY — 37 items
APPLIER CLIP UNV 5X34 EPIX (ENDOMECHANICALS) ×1 IMPLANT
APR XCLPCLP 20M/L UNV 34X5 (ENDOMECHANICALS) ×1
BAG HAMPER (MISCELLANEOUS) ×2 IMPLANT
BAG SPEC RTRVL LRG 6X4 10 (ENDOMECHANICALS) ×1
BLADE SURG SZ11 CARB STEEL (BLADE) ×2 IMPLANT
CLOTH BEACON ORANGE TIMEOUT ST (SAFETY) ×2 IMPLANT
COVER LIGHT HANDLE STERIS (MISCELLANEOUS) ×4 IMPLANT
ELECT REM PT RETURN 9FT ADLT (ELECTROSURGICAL) ×2
ELECTRODE REM PT RTRN 9FT ADLT (ELECTROSURGICAL) ×1 IMPLANT
FILTER SMOKE EVAC LAPAROSHD (FILTER) ×2 IMPLANT
FORMALIN 10 PREFIL 480ML (MISCELLANEOUS) ×2 IMPLANT
GLOVE BIOGEL PI IND STRL 8 (GLOVE) ×1 IMPLANT
GLOVE BIOGEL PI INDICATOR 8 (GLOVE) ×1
GLOVE ECLIPSE 6.5 STRL STRAW (GLOVE) ×1 IMPLANT
GLOVE ECLIPSE 8.0 STRL XLNG CF (GLOVE) ×2 IMPLANT
GLOVE EXAM NITRILE MD LF STRL (GLOVE) ×3 IMPLANT
GLOVE INDICATOR 7.0 STRL GRN (GLOVE) ×1 IMPLANT
GOWN STRL REIN XL XLG (GOWN DISPOSABLE) ×4 IMPLANT
INST SET LAPROSCOPIC GYN AP (KITS) ×2 IMPLANT
IV NS IRRIG 3000ML ARTHROMATIC (IV SOLUTION) ×1 IMPLANT
KIT ROOM TURNOVER APOR (KITS) ×2 IMPLANT
KIT TROCAR LAP GYN (TROCAR) ×2 IMPLANT
MANIFOLD NEPTUNE II (INSTRUMENTS) ×2 IMPLANT
PACK PERI GYN (CUSTOM PROCEDURE TRAY) ×2 IMPLANT
PAD ARMBOARD 7.5X6 YLW CONV (MISCELLANEOUS) ×2 IMPLANT
POUCH SPECIMEN RETRIEVAL 10MM (ENDOMECHANICALS) ×1 IMPLANT
SCALPEL HARMONIC ACE (MISCELLANEOUS) ×2 IMPLANT
SET BASIN LINEN APH (SET/KITS/TRAYS/PACK) ×2 IMPLANT
SET TUBE IRRIG SUCTION NO TIP (IRRIGATION / IRRIGATOR) ×1 IMPLANT
SOLUTION ANTI FOG 6CC (MISCELLANEOUS) ×2 IMPLANT
SPONGE GAUZE 2X2 8PLY STRL LF (GAUZE/BANDAGES/DRESSINGS) ×3 IMPLANT
STAPLER VISISTAT 35W (STAPLE) ×2 IMPLANT
SUT VICRYL 0 UR6 27IN ABS (SUTURE) ×3 IMPLANT
TAPE CLOTH SURG 4X10 WHT LF (GAUZE/BANDAGES/DRESSINGS) ×1 IMPLANT
TRAY FOLEY CATH 14FR (SET/KITS/TRAYS/PACK) ×2 IMPLANT
TUBING HI FLO HEAT INSUFFLATOR (IRRIGATION / IRRIGATOR) ×2 IMPLANT
WARMER LAPAROSCOPE (MISCELLANEOUS) ×2 IMPLANT

## 2012-05-14 NOTE — Op Note (Signed)
Preoperative diagnosis:  1.  Complex right ovarian mass, heavily calcified and multicystic                                         2.  normal preoperative CA 125                                         3.  RLQ pain  Postoperative diagnosis:  Same as above  Procedure:  Laparoscopic right salpingo-oophorectomy  Surgeon:  Rockne Coons MD  Anesthesia:  Gen. Endotracheal  Findings:   Patient presented compaining of RLQ for a couple of months, worse with certain movements including intercourse.  Sonogram revealed a 6 cm right multicystic complex mass.   Her preoperative CA 125 was normal.  The left ovary was normal on sonogram and at the time of surgery.   Thye mas did not suppress with megestrol.  As a result the patient has opted for definitive surgical management.  Intraoperatively the preoperative findings were confirmed.    Description of operation:  The patient was taken to the operating room and placed in the supine position where she underwent general endotracheal anesthesia.  She was placed in the low lithotomy position.  She was prepped and draped in the usual sterile fashion and a Foley catheter was placed.  An incision was made in the umbilicus and a Veres needle was placed into the peritoneal cavity with one pass without difficulty.  The peritoneal cavity was then insufflated.  An 11 mm non-bladed direct visualization trocar was then used and placed into the peritoneal cavity with direct laparoscopic visualization again without difficulty.  Incisions were then made in the right lower quadrant and suprapubic region in the midline.  A 5 mm trocar was placed in both incisions.  Both were done under direct visualization without difficulty.  The right ovary was grasped.  The ovary and tube were densely adherent to the right pelvic sidewall and also to the epiploici of the small bowel and rectosigmoid.  The harmonic scalpel was used and the infundibulopelvic ligament on the right was coagulated  and then transected.   Great care was taken to avoid ureteral injury as the mass was so densely adherent all the way down.  There was good peristalsis of the ureter after liberation of the right adnexal complex. He remaining peritoneal attachments of the right ovary were also taken down hemostatically.    An Endo Catch was placed and  the right ovary was put in the bag and removed through the umbilical  incision without difficulty.  I did have to extend the fascial and skin incision just a bit. The umbilical fascia was closed using 0 vicryl. The subcutaneous tissue was then reapproximated with 0 Vicryl. The skin was closed using skin staples.    The patient received Ancef  preoperatively.  She also received Toradol IV preoperatively.  She will be discharged from the recovery room time and seen next week for staple and suture removal.  Rodrigo Mcgranahan H 05/14/2012 10:18 AM

## 2012-05-14 NOTE — Anesthesia Postprocedure Evaluation (Signed)
Anesthesia Post Note  Patient: Joanna Reid  Procedure(s) Performed: Procedure(s) (LRB): LAPAROSCOPIC UNILATERAL SALPINGO OOPHORECTOMY (Right)  Anesthesia type: General  Patient location: PACU  Post pain: Pain level controlled  Post assessment: Post-op Vital signs reviewed, Patient's Cardiovascular Status Stable, Respiratory Function Stable, Patent Airway, No signs of Nausea or vomiting and Pain level controlled  Last Vitals:  Filed Vitals:   05/14/12 1024  BP: 133/100  Pulse: 74  Temp: 36.8 C  Resp: 12    Post vital signs: Reviewed and stable  Level of consciousness: awake and alert   Complications: No apparent anesthesia complications

## 2012-05-14 NOTE — H&P (Signed)
Joanna Reid is an 46 y.o. female. 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  Her CA 125 was normal and it did not suppress with megestrol.  Additionally her pain has continued throughout.  She desires definitive therapy because of the pain.    Past Medical History  Diagnosis Date  . HBP (high blood pressure)   . Diabetes mellitus     Past Surgical History  Procedure Date  . Tonsilectomy, adenoidectomy, bilateral myringotomy and tubes   . Vesicovaginal fistula closure w/ tah   . Eye surgery   . Kidney stones 1998    Family History  Problem Relation Age of Onset  . Arthritis    . Cancer    . Diabetes      Social History:  reports that she has never smoked. She does not have any smokeless tobacco history on file. She reports that she does not drink alcohol or use illicit drugs.  Allergies: No Known Allergies  Prescriptions prior to admission  Medication Sig Dispense Refill  . allopurinol (ZYLOPRIM) 300 MG tablet Take 300 mg by mouth daily.       Marland Kitchen amLODipine (NORVASC) 5 MG tablet Take 5 mg by mouth daily.      . bumetanide (BUMEX) 1 MG tablet Take 1 mg by mouth daily.       . fluticasone (FLONASE) 50 MCG/ACT nasal spray Place 2 sprays into the nose 2 (two) times daily.      Marland Kitchen losartan (COZAAR) 50 MG tablet Take 50 mg by mouth daily.       . metFORMIN (GLUCOPHAGE) 500 MG tablet Take 500 mg by mouth daily with breakfast.       . omeprazole (PRILOSEC) 20 MG capsule Take 20 mg by mouth daily.       Marland Kitchen topiramate (TOPAMAX) 50 MG tablet Take 50 mg by mouth 2 (two) times daily.        ROS  Review of Systems  Constitutional: Negative for fever, chills, weight loss, malaise/fatigue and diaphoresis.  HENT: Negative for hearing loss, ear pain, nosebleeds, congestion, sore throat, neck pain, tinnitus and ear discharge.   Eyes: Negative for blurred vision, double vision, photophobia, pain, discharge and redness.  Respiratory: Negative  for cough, hemoptysis, sputum production, shortness of breath, wheezing and stridor.   Cardiovascular: Negative for chest pain, palpitations, orthopnea, claudication, leg swelling and PND.  Gastrointestinal: Negative for abdominal pain. Negative for heartburn, nausea, vomiting, diarrhea, constipation, blood in stool and melena.  Genitourinary: Negative for dysuria, urgency, frequency, hematuria and flank pain.  Musculoskeletal: Negative for myalgias, back pain, joint pain and falls.  Skin: Negative for itching and rash.  Neurological: Negative for dizziness, tingling, tremors, sensory change, speech change, focal weakness, seizures, loss of consciousness, weakness and headaches.  Endo/Heme/Allergies: Negative for environmental allergies and polydipsia. Does not bruise/bleed easily.  Psychiatric/Behavioral: Negative for depression, suicidal ideas, hallucinations, memory loss and substance abuse. The patient is not nervous/anxious and does not have insomnia.     Blood pressure 128/85, pulse 69, temperature 98.2 F (36.8 C), resp. rate 13, SpO2 99.00%. Physical Exam Physical Exam  Vitals reviewed. Constitutional: She is oriented to person, place, and time. She appears well-developed and well-nourished.  HENT:  Head: Normocephalic and atraumatic.  Right Ear: External ear normal.  Left Ear: External ear normal.  Nose: Nose normal.  Mouth/Throat: Oropharynx is clear and moist.  Eyes: Conjunctivae and EOM are normal. Pupils are equal, round, and reactive to light.  Right eye exhibits no discharge. Left eye exhibits no discharge. No scleral icterus.  Neck: Normal range of motion. Neck supple. No tracheal deviation present. No thyromegaly present.  Cardiovascular: Normal rate, regular rhythm, normal heart sounds and intact distal pulses.  Exam reveals no gallop and no friction rub.   No murmur heard. Respiratory: Effort normal and breath sounds normal. No respiratory distress. She has no wheezes.  She has no rales. She exhibits no tenderness.  GI: Soft. Bowel sounds are normal. She exhibits no distension and no mass. There is tenderness. There is no rebound and no guarding.  Genitourinary:       Vulva is normal without lesions Vagina is pink moist without discharge Cervix normal in appearance and pap is normal Uterus is normal Adnexa is significant for a multicystic 6 cm mass on the right ovary, the left ovary is normal Musculoskeletal: Normal range of motion. She exhibits no edema and no tenderness.  Neurological: She is alert and oriented to person, place, and time. She has normal reflexes. She displays normal reflexes. No cranial nerve deficit. She exhibits normal muscle tone. Coordination normal.  Skin: Skin is warm and dry. No rash noted. No erythema. No pallor.  Psychiatric: She has a normal mood and affect. Her behavior is normal. Judgment and thought content normal.     Recent Results (from the past 336 hour(s))  SURGICAL PCR SCREEN   Collection Time   05/07/12  8:29 AM      Component Value Range   MRSA, PCR NEGATIVE  NEGATIVE   Staphylococcus aureus NEGATIVE  NEGATIVE  URINALYSIS, ROUTINE W REFLEX MICROSCOPIC   Collection Time   05/07/12  8:29 AM      Component Value Range   Color, Urine YELLOW  YELLOW   APPearance CLEAR  CLEAR   Specific Gravity, Urine 1.020  1.005 - 1.030   pH 6.0  5.0 - 8.0   Glucose, UA NEGATIVE  NEGATIVE mg/dL   Hgb urine dipstick TRACE (*) NEGATIVE   Bilirubin Urine NEGATIVE  NEGATIVE   Ketones, ur NEGATIVE  NEGATIVE mg/dL   Protein, ur NEGATIVE  NEGATIVE mg/dL   Urobilinogen, UA 0.2  0.0 - 1.0 mg/dL   Nitrite NEGATIVE  NEGATIVE   Leukocytes, UA TRACE (*) NEGATIVE  URINE MICROSCOPIC-ADD ON   Collection Time   05/07/12  8:29 AM      Component Value Range   Squamous Epithelial / LPF FEW (*) RARE   WBC, UA 3-6  <3 WBC/hpf   RBC / HPF 3-6  <3 RBC/hpf   Bacteria, UA MANY (*) RARE  URINE CULTURE   Collection Time   05/07/12  8:29 AM       Component Value Range   Specimen Description URINE, CLEAN CATCH     Special Requests NONE     Culture  Setup Time 05/07/2012 16:09     Colony Count >=100,000 COLONIES/ML     Culture ESCHERICHIA COLI     Report Status 05/10/2012 FINAL     Organism ID, Bacteria ESCHERICHIA COLI    CBC   Collection Time   05/07/12  8:30 AM      Component Value Range   WBC 7.5  4.0 - 10.5 K/uL   RBC 4.25  3.87 - 5.11 MIL/uL   Hemoglobin 12.2  12.0 - 15.0 g/dL   HCT 16.1  09.6 - 04.5 %   MCV 84.9  78.0 - 100.0 fL   MCH 28.7  26.0 - 34.0 pg  MCHC 33.8  30.0 - 36.0 g/dL   RDW 62.9  52.8 - 41.3 %   Platelets 361  150 - 400 K/uL  COMPREHENSIVE METABOLIC PANEL   Collection Time   05/07/12  8:30 AM      Component Value Range   Sodium 140  135 - 145 mEq/L   Potassium 3.5  3.5 - 5.1 mEq/L   Chloride 105  96 - 112 mEq/L   CO2 24  19 - 32 mEq/L   Glucose, Bld 134 (*) 70 - 99 mg/dL   BUN 19  6 - 23 mg/dL   Creatinine, Ser 2.44  0.50 - 1.10 mg/dL   Calcium 01.0  8.4 - 27.2 mg/dL   Total Protein 6.9  6.0 - 8.3 g/dL   Albumin 3.9  3.5 - 5.2 g/dL   AST 14  0 - 37 U/L   ALT 17  0 - 35 U/L   Alkaline Phosphatase 70  39 - 117 U/L   Total Bilirubin 0.4  0.3 - 1.2 mg/dL   GFR calc non Af Amer 86 (*) >90 mL/min   GFR calc Af Amer >90  >90 mL/min  HCG, QUANTITATIVE, PREGNANCY   Collection Time   05/07/12  8:30 AM      Component Value Range   hCG, Beta Chain, Quant, S <1  <5 mIU/mL       Assessment/Plan: 1.  Multicystic right ovarian mass 2.  Right lower quadrant pain  Patient considered conservative versus surgical options and has chosen surgery due to her pain and the multicystic nature of the mass.  Pt understands the risks of surgery including but not limited t  excessive bleeding requiring transfusion or reoperation, post-operative infection requiring prolonged hospitalization or re-hospitalization and antibiotic therapy, and damage to other organs including bladder, bowel, ureters and major  vessels.  The patient also understands the alternative treatment options which were discussed in full.  All questions were answered.   Jehan Ranganathan H 05/14/2012, 8:39 AM

## 2012-05-14 NOTE — Anesthesia Procedure Notes (Signed)
Procedure Name: Intubation Date/Time: 05/14/2012 9:04 AM Performed by: Franco Nones Pre-anesthesia Checklist: Patient identified, Patient being monitored, Timeout performed, Emergency Drugs available and Suction available Patient Re-evaluated:Patient Re-evaluated prior to inductionOxygen Delivery Method: Circle System Utilized Preoxygenation: Pre-oxygenation with 100% oxygen Intubation Type: IV induction, Rapid sequence and Cricoid Pressure applied Laryngoscope Size: Miller and 2 Grade View: Grade I Tube type: Oral Tube size: 7.0 mm Number of attempts: 1 Airway Equipment and Method: stylet Placement Confirmation: ETT inserted through vocal cords under direct vision,  positive ETCO2 and breath sounds checked- equal and bilateral Secured at: 21 cm Tube secured with: Tape Dental Injury: Teeth and Oropharynx as per pre-operative assessment

## 2012-05-14 NOTE — Anesthesia Preprocedure Evaluation (Signed)
Anesthesia Evaluation  Patient identified by MRN, date of birth, ID band Patient awake    Reviewed: Allergy & Precautions, H&P , NPO status , Patient's Chart, lab work & pertinent test results  History of Anesthesia Complications Negative for: history of anesthetic complications  Airway Mallampati: II TM Distance: >3 FB Neck ROM: Full    Dental  (+) Partial Upper and Teeth Intact   Pulmonary neg pulmonary ROS,  breath sounds clear to auscultation        Cardiovascular hypertension, Pt. on medications Rhythm:Regular Rate:Normal     Neuro/Psych    GI/Hepatic GERD-  Controlled and Medicated,  Endo/Other  diabetes, Well Controlled, Oral Hypoglycemic Agents  Renal/GU      Musculoskeletal   Abdominal   Peds  Hematology   Anesthesia Other Findings   Reproductive/Obstetrics                           Anesthesia Physical Anesthesia Plan  ASA: II  Anesthesia Plan: General   Post-op Pain Management:    Induction: Intravenous, Rapid sequence and Cricoid pressure planned  Airway Management Planned: Oral ETT  Additional Equipment:   Intra-op Plan:   Post-operative Plan: Extubation in OR  Informed Consent: I have reviewed the patients History and Physical, chart, labs and discussed the procedure including the risks, benefits and alternatives for the proposed anesthesia with the patient or authorized representative who has indicated his/her understanding and acceptance.     Plan Discussed with:   Anesthesia Plan Comments:         Anesthesia Quick Evaluation

## 2012-05-14 NOTE — Transfer of Care (Signed)
Immediate Anesthesia Transfer of Care Note  Patient: Joanna Reid  Procedure(s) Performed: Procedure(s) (LRB): LAPAROSCOPIC UNILATERAL SALPINGO OOPHORECTOMY (Right)  Patient Location: PACU  Anesthesia Type: General  Level of Consciousness: awake  Airway & Oxygen Therapy: Patient Spontanous Breathing and non-rebreather face mask  Post-op Assessment: Report given to PACU RN, Post -op Vital signs reviewed and stable and Patient moving all extremities  Post vital signs: Reviewed and stable  Complications: No apparent anesthesia complications

## 2012-05-15 ENCOUNTER — Encounter (HOSPITAL_COMMUNITY): Payer: Self-pay | Admitting: Obstetrics & Gynecology

## 2012-11-18 ENCOUNTER — Encounter: Payer: Self-pay | Admitting: *Deleted

## 2012-11-18 NOTE — H&P (Signed)
  NTS SOAP Note  Vital Signs:  Vitals as of: 11/18/2012: Systolic 153: Diastolic 108: Heart Rate 92: Temp 97.36F: Height 58ft 4in: Weight 255Lbs 0 Ounces: Pain Level 8: BMI 43.77  BMI : 43.77 kg/m2  Subjective: This 47 Years 67 Months old Female presents for of    HEMORRHOIDS: ,Has had a swollen hemorrhoid for over a week now.  Painful, though somewhat decreased in size.  Creams have not been helpful.  s/p hemorrhoidectomy for thrombosed hemorrhoid in 2008.  Review of Symptoms:  Constitutional:unremarkable   Head:unremarkable    Eyes:unremarkable   sinus problems Cardiovascular:  unremarkable   Respiratory:unremarkable   Gastrointestinal:  unremarkable   Genitourinary:unremarkable     Musculoskeletal:unremarkable   Skin:unremarkable Hematolgic/Lymphatic:unremarkable       hay fever   Past Medical History:    Reviewed   Past Medical History  Surgical History: hemorrhoidectomy 2008, c-section, hysterectomy, oophorectomy Medical Problems: HTN, NIDDM, gout Allergies: nkda Medications: omeprazole, losartan, metformin, bumex, allopurinol, flonase prn   Social History:Reviewed  Social History  Preferred Language: English Race:  White Ethnicity: Not Hispanic / Latino Age: 47 Years 3 Months Marital Status:  M Alcohol:  No Recreational drug(s):  No   Smoking Status: Never smoker reviewed on 11/18/2012 Functional Status reviewed on mm/dd/yyyy ------------------------------------------------ Bathing: Normal Cooking: Normal Dressing: Normal Driving: Normal Eating: Normal Managing Meds: Normal Oral Care: Normal Shopping: Normal Toileting: Normal Transferring: Normal Walking: Normal Cognitive Status reviewed on mm/dd/yyyy ------------------------------------------------ Attention: Normal Decision Making: Normal Language: Normal Memory: Normal Motor: Normal Perception: Normal Problem Solving: Normal Visual and Spatial:  Normal   Family History:  Reviewed   Family History              Father:  Diabetes Type II             Sibling:  Diabetes Type II    Objective Information: General:  Well appearing, well nourished in no distress. Heart:  RRR, no murmur Lungs:    CTA bilaterally, no wheezes, rhonchi, rales.  Breathing unlabored.   Thrombosed hemorrhoid noted along right lateral aspect of the anus. Lymphatics:  Assessment:thrombosed hemorrhoid  Diagnosis &amp; Procedure Smart Code   Plan:Scheduled for hemorrhoidectomy on 11/19/12.   Patient Education:Alternative treatments to surgery were discussed with patient (and family).  Risks and benefits  of procedure including recurrence of the hemorrhoids were fully explained to the patient (and family) who gave informed consent. Patient/family questions were addressed.  Follow-up:Pending Surgery

## 2012-11-19 ENCOUNTER — Encounter (HOSPITAL_COMMUNITY): Payer: Self-pay | Admitting: *Deleted

## 2012-11-19 ENCOUNTER — Encounter (HOSPITAL_COMMUNITY): Payer: Self-pay | Admitting: Anesthesiology

## 2012-11-19 ENCOUNTER — Encounter (HOSPITAL_COMMUNITY)
Admission: RE | Disposition: A | Discharge status: Home / Self Care | Payer: Self-pay | Source: Ambulatory Visit | Attending: General Surgery

## 2012-11-19 ENCOUNTER — Ambulatory Visit (HOSPITAL_COMMUNITY)
Admission: RE | Admit: 2012-11-19 | Discharge: 2012-11-19 | Disposition: A | Discharge status: Home / Self Care | Payer: BC Managed Care – PPO | Source: Ambulatory Visit | Attending: General Surgery | Admitting: General Surgery

## 2012-11-19 ENCOUNTER — Ambulatory Visit (HOSPITAL_COMMUNITY): Payer: BC Managed Care – PPO | Admitting: Anesthesiology

## 2012-11-19 DIAGNOSIS — K645 Perianal venous thrombosis: Secondary | ICD-10-CM | POA: Insufficient documentation

## 2012-11-19 DIAGNOSIS — M109 Gout, unspecified: Secondary | ICD-10-CM | POA: Insufficient documentation

## 2012-11-19 DIAGNOSIS — Z79899 Other long term (current) drug therapy: Secondary | ICD-10-CM | POA: Insufficient documentation

## 2012-11-19 DIAGNOSIS — E119 Type 2 diabetes mellitus without complications: Secondary | ICD-10-CM | POA: Insufficient documentation

## 2012-11-19 DIAGNOSIS — K648 Other hemorrhoids: Secondary | ICD-10-CM | POA: Insufficient documentation

## 2012-11-19 DIAGNOSIS — I10 Essential (primary) hypertension: Secondary | ICD-10-CM | POA: Insufficient documentation

## 2012-11-19 HISTORY — PX: HEMORRHOID SURGERY: SHX153

## 2012-11-19 LAB — SURGICAL PCR SCREEN
MRSA, PCR: NEGATIVE
Staphylococcus aureus: NEGATIVE

## 2012-11-19 LAB — CBC WITH DIFFERENTIAL/PLATELET
Basophils Absolute: 0 10*3/uL (ref 0.0–0.1)
Eosinophils Relative: 1 % (ref 0–5)
Lymphocytes Relative: 31 % (ref 12–46)
Neutro Abs: 4.6 10*3/uL (ref 1.7–7.7)
Neutrophils Relative %: 59 % (ref 43–77)
Platelets: 380 10*3/uL (ref 150–400)
RDW: 14.3 % (ref 11.5–15.5)
WBC: 7.7 10*3/uL (ref 4.0–10.5)

## 2012-11-19 LAB — BASIC METABOLIC PANEL
CO2: 26 mEq/L (ref 19–32)
Calcium: 10 mg/dL (ref 8.4–10.5)
Sodium: 139 mEq/L (ref 135–145)

## 2012-11-19 LAB — GLUCOSE, CAPILLARY

## 2012-11-19 SURGERY — HEMORRHOIDECTOMY
Anesthesia: General | Site: Rectum | Wound class: Dirty or Infected

## 2012-11-19 MED ORDER — LIDOCAINE VISCOUS 2 % MT SOLN
OROMUCOSAL | Status: AC
  Filled 2012-11-19: qty 15

## 2012-11-19 MED ORDER — MIDAZOLAM HCL 2 MG/2ML IJ SOLN
INTRAMUSCULAR | Status: AC
  Filled 2012-11-19: qty 2

## 2012-11-19 MED ORDER — FENTANYL CITRATE 0.05 MG/ML IJ SOLN
INTRAMUSCULAR | Status: AC
  Filled 2012-11-19: qty 2

## 2012-11-19 MED ORDER — SODIUM CHLORIDE 0.9 % IR SOLN
Status: DC | PRN
  Administered 2012-11-19: 1000 mL

## 2012-11-19 MED ORDER — ONDANSETRON HCL 4 MG/2ML IJ SOLN
4.0000 mg | Freq: Once | INTRAMUSCULAR | Status: AC
  Administered 2012-11-19: 4 mg via INTRAVENOUS

## 2012-11-19 MED ORDER — FENTANYL CITRATE 0.05 MG/ML IJ SOLN
25.0000 ug | INTRAMUSCULAR | Status: DC | PRN
  Administered 2012-11-19 (×4): 50 ug via INTRAVENOUS

## 2012-11-19 MED ORDER — ROCURONIUM BROMIDE 50 MG/5ML IV SOLN
INTRAVENOUS | Status: AC
  Filled 2012-11-19: qty 1

## 2012-11-19 MED ORDER — MUPIROCIN 2 % EX OINT
TOPICAL_OINTMENT | CUTANEOUS | Status: AC
  Filled 2012-11-19: qty 22

## 2012-11-19 MED ORDER — NEOSTIGMINE METHYLSULFATE 1 MG/ML IJ SOLN
INTRAMUSCULAR | Status: AC
  Filled 2012-11-19: qty 1

## 2012-11-19 MED ORDER — MUPIROCIN 2 % EX OINT
TOPICAL_OINTMENT | Freq: Two times a day (BID) | CUTANEOUS | Status: DC
  Administered 2012-11-19: 1 via NASAL

## 2012-11-19 MED ORDER — PROPOFOL 10 MG/ML IV EMUL
INTRAVENOUS | Status: AC
  Filled 2012-11-19: qty 20

## 2012-11-19 MED ORDER — BUPIVACAINE HCL (PF) 0.5 % IJ SOLN
INTRAMUSCULAR | Status: AC
  Filled 2012-11-19: qty 30

## 2012-11-19 MED ORDER — MIDAZOLAM HCL 2 MG/2ML IJ SOLN
1.0000 mg | INTRAMUSCULAR | Status: DC | PRN
  Administered 2012-11-19: 2 mg via INTRAVENOUS

## 2012-11-19 MED ORDER — ONDANSETRON HCL 4 MG/2ML IJ SOLN: 4.0000 mg | Freq: Once | INTRAMUSCULAR | Status: DC | PRN

## 2012-11-19 MED ORDER — LIDOCAINE VISCOUS 2 % MT SOLN
OROMUCOSAL | Status: DC | PRN
  Administered 2012-11-19: 1

## 2012-11-19 MED ORDER — METRONIDAZOLE IN NACL 5-0.79 MG/ML-% IV SOLN
500.0000 mg | INTRAVENOUS | Status: AC
  Administered 2012-11-19: .5 g via INTRAVENOUS

## 2012-11-19 MED ORDER — FENTANYL CITRATE 0.05 MG/ML IJ SOLN
INTRAMUSCULAR | Status: DC | PRN
  Administered 2012-11-19: 100 ug via INTRAVENOUS

## 2012-11-19 MED ORDER — LIDOCAINE HCL (CARDIAC) 10 MG/ML IV SOLN
INTRAVENOUS | Status: DC | PRN
  Administered 2012-11-19: 20 mg via INTRAVENOUS

## 2012-11-19 MED ORDER — ONDANSETRON HCL 4 MG/2ML IJ SOLN
INTRAMUSCULAR | Status: AC
  Filled 2012-11-19: qty 2

## 2012-11-19 MED ORDER — PROPOFOL 10 MG/ML IV BOLUS
INTRAVENOUS | Status: DC | PRN
  Administered 2012-11-19: 150 mg via INTRAVENOUS
  Administered 2012-11-19: 30 mg via INTRAVENOUS

## 2012-11-19 MED ORDER — KETOROLAC TROMETHAMINE 30 MG/ML IJ SOLN
30.0000 mg | Freq: Once | INTRAMUSCULAR | Status: AC
  Administered 2012-11-19: 30 mg via INTRAVENOUS

## 2012-11-19 MED ORDER — KETOROLAC TROMETHAMINE 30 MG/ML IJ SOLN
INTRAMUSCULAR | Status: AC
  Filled 2012-11-19: qty 1

## 2012-11-19 MED ORDER — OXYCODONE-ACETAMINOPHEN 7.5-325 MG PO TABS: 1.0000 | ORAL_TABLET | ORAL | Status: DC | PRN

## 2012-11-19 MED ORDER — GLYCOPYRROLATE 0.2 MG/ML IJ SOLN
INTRAMUSCULAR | Status: DC | PRN
  Administered 2012-11-19: 0.4 mg via INTRAVENOUS

## 2012-11-19 MED ORDER — NEOSTIGMINE METHYLSULFATE 1 MG/ML IJ SOLN
INTRAMUSCULAR | Status: DC | PRN
  Administered 2012-11-19: 2 mg via INTRAVENOUS

## 2012-11-19 MED ORDER — METRONIDAZOLE IN NACL 5-0.79 MG/ML-% IV SOLN
INTRAVENOUS | Status: AC
  Filled 2012-11-19: qty 100

## 2012-11-19 MED ORDER — HEMOSTATIC AGENTS (NO CHARGE) OPTIME
TOPICAL | Status: DC | PRN
  Administered 2012-11-19: 1 via TOPICAL

## 2012-11-19 MED ORDER — BUPIVACAINE HCL (PF) 0.5 % IJ SOLN
INTRAMUSCULAR | Status: DC | PRN
  Administered 2012-11-19: 7 mL

## 2012-11-19 MED ORDER — ROCURONIUM BROMIDE 100 MG/10ML IV SOLN
INTRAVENOUS | Status: DC | PRN
  Administered 2012-11-19: 20 mg via INTRAVENOUS
  Administered 2012-11-19: 5 mg via INTRAVENOUS

## 2012-11-19 MED ORDER — LACTATED RINGERS IV SOLN
INTRAVENOUS | Status: DC
  Administered 2012-11-19: 1000 mL via INTRAVENOUS

## 2012-11-19 MED ORDER — GLYCOPYRROLATE 0.2 MG/ML IJ SOLN
INTRAMUSCULAR | Status: AC
  Filled 2012-11-19: qty 2

## 2012-11-19 MED ORDER — LIDOCAINE HCL (PF) 1 % IJ SOLN
INTRAMUSCULAR | Status: AC
  Filled 2012-11-19: qty 5

## 2012-11-19 SURGICAL SUPPLY — 31 items
BAG HAMPER (MISCELLANEOUS) ×2 IMPLANT
CLOTH BEACON ORANGE TIMEOUT ST (SAFETY) ×2 IMPLANT
COVER LIGHT HANDLE STERIS (MISCELLANEOUS) ×4 IMPLANT
DECANTER SPIKE VIAL GLASS SM (MISCELLANEOUS) ×2 IMPLANT
DRAPE PROXIMA HALF (DRAPES) ×2 IMPLANT
ELECT REM PT RETURN 9FT ADLT (ELECTROSURGICAL) ×2
ELECTRODE REM PT RTRN 9FT ADLT (ELECTROSURGICAL) ×1 IMPLANT
FORMALIN 10 PREFIL 120ML (MISCELLANEOUS) ×2 IMPLANT
GLOVE BIO SURGEON STRL SZ7.5 (GLOVE) ×2 IMPLANT
GLOVE BIOGEL PI IND STRL 7.0 (GLOVE) IMPLANT
GLOVE BIOGEL PI IND STRL 8 (GLOVE) IMPLANT
GLOVE BIOGEL PI INDICATOR 7.0 (GLOVE) ×1
GLOVE BIOGEL PI INDICATOR 8 (GLOVE) ×1
GLOVE SS BIOGEL STRL SZ 6.5 (GLOVE) IMPLANT
GLOVE SUPERSENSE BIOGEL SZ 6.5 (GLOVE) ×1
GOWN STRL REIN XL XLG (GOWN DISPOSABLE) ×6 IMPLANT
HEMOSTAT SURGICEL 4X8 (HEMOSTASIS) ×2 IMPLANT
KIT ROOM TURNOVER AP CYSTO (KITS) ×2 IMPLANT
LIGASURE IMPACT 36 18CM CVD LR (INSTRUMENTS) ×1 IMPLANT
MANIFOLD NEPTUNE II (INSTRUMENTS) ×2 IMPLANT
NDL HYPO 25X1 1.5 SAFETY (NEEDLE) ×1 IMPLANT
NEEDLE HYPO 25X1 1.5 SAFETY (NEEDLE) ×2 IMPLANT
NS IRRIG 1000ML POUR BTL (IV SOLUTION) ×2 IMPLANT
PACK PERI GYN (CUSTOM PROCEDURE TRAY) ×2 IMPLANT
PAD ARMBOARD 7.5X6 YLW CONV (MISCELLANEOUS) ×2 IMPLANT
SET BASIN LINEN APH (SET/KITS/TRAYS/PACK) ×2 IMPLANT
SHEARS HARMONIC 9CM CVD (BLADE) IMPLANT
SPONGE GAUZE 4X4 12PLY (GAUZE/BANDAGES/DRESSINGS) ×2 IMPLANT
SUT SILK 0 FSL (SUTURE) ×2 IMPLANT
SUT VIC AB 2-0 CT2 27 (SUTURE) IMPLANT
SYR CONTROL 10ML LL (SYRINGE) ×2 IMPLANT

## 2012-11-19 NOTE — Transfer of Care (Signed)
Immediate Anesthesia Transfer of Care Note  Patient: Joanna Reid  Procedure(s) Performed: Procedure(s) (LRB): HEMORRHOIDECTOMY (N/A)  Patient Location: PACU  Anesthesia Type: General  Level of Consciousness: awake  Airway & Oxygen Therapy: Patient Spontanous Breathing and non-rebreather face mask  Post-op Assessment: Report given to PACU RN, Post -op Vital signs reviewed and stable and Patient moving all extremities  Post vital signs: Reviewed and stable  Complications: No apparent anesthesia complications

## 2012-11-19 NOTE — Interval H&P Note (Signed)
History and Physical Interval Note:  11/19/2012 11:05 AM  Joanna Reid  has presented today for surgery, with the diagnosis of thrombosed hemorrhoid  The various methods of treatment have been discussed with the patient and family. After consideration of risks, benefits and other options for treatment, the patient has consented to  Procedure(s): HEMORRHOIDECTOMY (N/A) as a surgical intervention .  The patient's history has been reviewed, patient examined, no change in status, stable for surgery.  I have reviewed the patient's chart and labs.  Questions were answered to the patient's satisfaction.     Franky Macho A

## 2012-11-19 NOTE — Anesthesia Postprocedure Evaluation (Signed)
Anesthesia Post Note  Patient: Joanna Reid  Procedure(s) Performed: Procedure(s) (LRB): HEMORRHOIDECTOMY (N/A)  Anesthesia type: General  Patient location: PACU  Post pain: Pain level controlled  Post assessment: Post-op Vital signs reviewed, Patient's Cardiovascular Status Stable, Respiratory Function Stable, Patent Airway, No signs of Nausea or vomiting and Pain level controlled  Last Vitals:  Filed Vitals:   11/19/12 1205  BP: 141/77  Pulse: 52  Temp: 36.5 C  Resp: 12    Post vital signs: Reviewed and stable  Level of consciousness: awake and alert   Complications: No apparent anesthesia complications

## 2012-11-19 NOTE — Anesthesia Procedure Notes (Signed)
Procedure Name: Intubation Date/Time: 11/19/2012 11:25 AM Performed by: Franco Nones Pre-anesthesia Checklist: Patient identified, Patient being monitored, Timeout performed, Emergency Drugs available and Suction available Patient Re-evaluated:Patient Re-evaluated prior to inductionOxygen Delivery Method: Circle System Utilized Preoxygenation: Pre-oxygenation with 100% oxygen Intubation Type: IV induction, Rapid sequence and Cricoid Pressure applied Ventilation: Mask ventilation without difficulty Laryngoscope Size: Miller and 2 Grade View: Grade I Tube type: Oral Tube size: 7.0 mm Number of attempts: 1 Airway Equipment and Method: stylet Placement Confirmation: ETT inserted through vocal cords under direct vision,  positive ETCO2 and breath sounds checked- equal and bilateral Secured at: 21 cm Tube secured with: Tape Dental Injury: Teeth and Oropharynx as per pre-operative assessment

## 2012-11-19 NOTE — Anesthesia Preprocedure Evaluation (Signed)
Anesthesia Evaluation  Patient identified by MRN, date of birth, ID band Patient awake    Reviewed: Allergy & Precautions, H&P , NPO status , Patient's Chart, lab work & pertinent test results  History of Anesthesia Complications Negative for: history of anesthetic complications  Airway Mallampati: II TM Distance: >3 FB Neck ROM: Full    Dental  (+) Partial Upper and Teeth Intact   Pulmonary neg pulmonary ROS,  breath sounds clear to auscultation        Cardiovascular hypertension, Pt. on medications Rhythm:Regular Rate:Normal     Neuro/Psych    GI/Hepatic GERD-  Controlled and Medicated,  Endo/Other  diabetes, Well Controlled, Type 2, Oral Hypoglycemic Agents  Renal/GU      Musculoskeletal   Abdominal   Peds  Hematology   Anesthesia Other Findings   Reproductive/Obstetrics                           Anesthesia Physical Anesthesia Plan  ASA: II  Anesthesia Plan: General   Post-op Pain Management:    Induction: Intravenous, Rapid sequence and Cricoid pressure planned  Airway Management Planned: Oral ETT  Additional Equipment:   Intra-op Plan:   Post-operative Plan: Extubation in OR  Informed Consent: I have reviewed the patients History and Physical, chart, labs and discussed the procedure including the risks, benefits and alternatives for the proposed anesthesia with the patient or authorized representative who has indicated his/her understanding and acceptance.     Plan Discussed with:   Anesthesia Plan Comments:         Anesthesia Quick Evaluation

## 2012-11-19 NOTE — Op Note (Signed)
Patient:  Joanna Reid  DOB:  1966/03/15  MRN:  [id number]   Preop Diagnosis:  Hemorrhoidal disease, thrombosed hemorrhoid  Postop Diagnosis:  Same  Procedure:  Extensive hemorrhoidectomy  Surgeon:  Franky Macho, M.D.  Anes:  General endotracheal  Indications:  Patient is a 47 year old white female with a history of hemorrhoidal disease who presents with a large thrombosed external hemorrhoid at the 9:00 position. She now presents for hemorrhoidectomy. The risks and benefits of the procedure completely, infection, and recurrence of the hemorrhoid were fully explained to the patient, who gave informed consent.  Procedure note:  The patient was placed in the lithotomy position after induction of general endotracheal anesthesia. The perineum was prepped and draped using usual sterile technique with Betadine. Surgical site confirmation was performed.  On rectal examination, a large thrombosed external hemorrhoid with internal hemorrhoidal disease was noted at the 9:00 position. In addition, 2 columns of internal and external hemorrhoidal swollen tissue is noted at the 4 to 5:00 position. Both internal and external hemorrhoids at those locations were excised using the LigaSure. More minor internal hemorrhoidal disease was noted, though none clinically significant. 0.5% Sensorcaine was instilled the surrounding peritoneum. Surgicel and Viscous Xylocaine rectal packing was then placed.  All tape and needle counts were correct the end of the procedure. Patient was extubated in the operating room and transferred to PACU in stable condition.   Complications:  None  EBL:  Minimal  Specimen:  Hemorrhoids

## 2012-11-19 NOTE — Addendum Note (Signed)
Addendum created 11/19/12 1308 by Franco Nones, CRNA   Modules edited: Anesthesia Medication Administration

## 2012-11-20 ENCOUNTER — Ambulatory Visit: Payer: Self-pay | Admitting: Nurse Practitioner

## 2012-11-21 ENCOUNTER — Encounter (HOSPITAL_COMMUNITY): Payer: Self-pay | Admitting: General Surgery

## 2013-01-20 ENCOUNTER — Telehealth: Payer: Self-pay | Admitting: Family Medicine

## 2013-01-20 ENCOUNTER — Encounter: Payer: Self-pay | Admitting: Family Medicine

## 2013-01-20 ENCOUNTER — Ambulatory Visit (INDEPENDENT_AMBULATORY_CARE_PROVIDER_SITE_OTHER): Payer: BC Managed Care – PPO | Admitting: Family Medicine

## 2013-01-20 VITALS — BP 132/84 | Temp 98.5°F | Wt 254.2 lb

## 2013-01-20 DIAGNOSIS — E782 Mixed hyperlipidemia: Secondary | ICD-10-CM

## 2013-01-20 DIAGNOSIS — E119 Type 2 diabetes mellitus without complications: Secondary | ICD-10-CM

## 2013-01-20 DIAGNOSIS — Z79899 Other long term (current) drug therapy: Secondary | ICD-10-CM

## 2013-01-20 DIAGNOSIS — R109 Unspecified abdominal pain: Secondary | ICD-10-CM

## 2013-01-20 DIAGNOSIS — M109 Gout, unspecified: Secondary | ICD-10-CM

## 2013-01-20 DIAGNOSIS — IMO0002 Reserved for concepts with insufficient information to code with codable children: Secondary | ICD-10-CM

## 2013-01-20 DIAGNOSIS — S39011A Strain of muscle, fascia and tendon of abdomen, initial encounter: Secondary | ICD-10-CM

## 2013-01-20 LAB — POCT URINALYSIS DIPSTICK: pH, UA: 7

## 2013-01-20 NOTE — Progress Notes (Signed)
  Subjective:    Patient ID: Joanna Reid, female    DOB: 01-Apr-1966, 47 y.o.   MRN: [id number]  Abdominal Pain This is a new problem. The current episode started in the past 7 days. The onset quality is gradual. The problem has been waxing and waning. The pain is located in the LLQ. The pain is at a severity of 4/10. The pain is moderate. The quality of the pain is aching. Associated symptoms include constipation. Pertinent negatives include no anorexia. Nothing aggravates the pain. She has tried nothing for the symptoms.   Pain is sharp. Definitely occurs when changing positions. No pain at rest. Fair appetite no vomiting no diarrhea no fever Notes urine slightly dark.  Review of Systems  Gastrointestinal: Positive for abdominal pain and constipation. Negative for anorexia.   ROS otherwise negative    Objective:   Physical Exam  Alert no acute distress. Lungs clear. Heart regular rate and rhythm. Abdomen excellent bowel sounds no rebound no discrete tenderness except along left lateral abdominal wall.  Urinalysis within normal limits.      Assessment & Plan:  Impression abdominal strain discussed. Plan symptomatic care discussed. Appropriate anti-inflammatory medicines. WSL

## 2013-01-20 NOTE — Patient Instructions (Signed)
Ibuprofen three otc tab three times per day with food next 5 or 7 d

## 2013-01-20 NOTE — Telephone Encounter (Signed)
Lip liv met7 a1c uric acid

## 2013-01-20 NOTE — Telephone Encounter (Signed)
Patient has an appointment today (January 20, 2013) for stomach pain therefore chart will be at the front desk prior to appointment at 11:00am  Med Check scheduled for February 03, 2013 @ 8:10am and would like to have Blood Work completed prior to this appointment.  Please call patient to advise if she needs to pick up this paperwork or if the lab will already have this paperwork.  Thanks

## 2013-01-20 NOTE — Telephone Encounter (Signed)
Blood work papers printed and left up front for patient pick up. Patient notified. 

## 2013-01-22 LAB — HEPATIC FUNCTION PANEL
ALT: 15 U/L (ref 0–35)
AST: 14 U/L (ref 0–37)
Bilirubin, Direct: 0.1 mg/dL (ref 0.0–0.3)
Indirect Bilirubin: 0.3 mg/dL (ref 0.0–0.9)
Total Bilirubin: 0.4 mg/dL (ref 0.3–1.2)

## 2013-01-22 LAB — BASIC METABOLIC PANEL
BUN: 16 mg/dL (ref 6–23)
CO2: 27 mEq/L (ref 19–32)
Glucose, Bld: 94 mg/dL (ref 70–99)
Potassium: 3.9 mEq/L (ref 3.5–5.3)
Sodium: 136 mEq/L (ref 135–145)

## 2013-01-22 LAB — LIPID PANEL
Cholesterol: 193 mg/dL (ref 0–200)
VLDL: 44 mg/dL — ABNORMAL HIGH (ref 0–40)

## 2013-01-22 LAB — HEMOGLOBIN A1C: Hgb A1c MFr Bld: 5.4 % (ref <5.7)

## 2013-02-03 ENCOUNTER — Ambulatory Visit: Payer: BC Managed Care – PPO | Admitting: Family Medicine

## 2013-02-10 ENCOUNTER — Ambulatory Visit (INDEPENDENT_AMBULATORY_CARE_PROVIDER_SITE_OTHER): Payer: BC Managed Care – PPO | Admitting: Nurse Practitioner

## 2013-02-10 ENCOUNTER — Encounter: Payer: Self-pay | Admitting: Nurse Practitioner

## 2013-02-10 VITALS — BP 138/90 | HR 80 | Ht 64.0 in | Wt 258.8 lb

## 2013-02-10 DIAGNOSIS — N390 Urinary tract infection, site not specified: Secondary | ICD-10-CM

## 2013-02-10 DIAGNOSIS — R300 Dysuria: Secondary | ICD-10-CM

## 2013-02-10 DIAGNOSIS — I10 Essential (primary) hypertension: Secondary | ICD-10-CM

## 2013-02-10 LAB — POCT UA - MICROSCOPIC ONLY
Bacteria, U Microscopic: POSITIVE
Mucus, UA: 0
WBC, Ur, HPF, POC: 5

## 2013-02-10 LAB — POCT URINALYSIS DIPSTICK: Spec Grav, UA: 1.02

## 2013-02-10 MED ORDER — CIPROFLOXACIN HCL 500 MG PO TABS: 500.0000 mg | ORAL_TABLET | Freq: Two times a day (BID) | ORAL | Status: DC

## 2013-02-10 MED ORDER — FLUCONAZOLE 150 MG PO TABS: ORAL_TABLET | ORAL | Status: DC

## 2013-02-10 NOTE — Patient Instructions (Addendum)
Sleeve gastrectomy Phentermine Qsymia Belviq AZO standard as directed for 48 hours then stop

## 2013-02-13 ENCOUNTER — Encounter: Payer: Self-pay | Admitting: Nurse Practitioner

## 2013-02-13 NOTE — Progress Notes (Signed)
Subjective:  Presents with complaints of dysuria that began yesterday. Urinary frequency and urgency, voiding small amounts. Taking fluids well. No history of recent UTI. No pelvic pain. No back pain. No vaginal discharge. Married, same sexual partner. No fever. Blood sugars have been running about 110. Her last hemoglobin A1c was around 5.  Objective:   BP 138/90  Pulse 80  Ht 5\' 4"  (1.626 m)  Wt 258 lb 12.8 oz (117.391 kg)  BMI 44.4 kg/m2 NAD. Alert, oriented. Lungs clear. Heart regular rate rhythm. No CVA area tenderness. Abdomen soft nondistended with mild pelvic area tenderness to palpation. Urine microscopic at least 5 WBCs per hpf with rare RBC, some bacteria and occasional epi cells.   Assessment:Dysuria - Plan: POCT urinalysis dipstick, POCT UA - Microscopic Only  UTI (urinary tract infection)  Essential hypertension, benign  Plan: Meds ordered this encounter  Medications  . ciprofloxacin (CIPRO) 500 MG tablet    Sig: Take 1 tablet (500 mg total) by mouth 2 (two) times daily.    Dispense:  14 tablet    Refill:  0    Order Specific Question:  Supervising Provider    Answer:  Merlyn Albert [[id number]]  . fluconazole (DIFLUCAN) 150 MG tablet    Sig: One po qd prn yeast infection; may repeat in 3-4 days if needed; may use after Cipro is complete    Dispense:  2 tablet    Refill:  0    Order Specific Question:  Supervising Provider    Answer:  Riccardo Dubin   May take AZO for 48 hours then discontinue. Call back in 72 hours if no improvement, sooner if worse. Do not take Diflucan while on Cipro. Recommend routine followup for diabetes and other health issues in the near future. Discussed importance of continued weight loss and reviewed some options.

## 2013-05-14 ENCOUNTER — Other Ambulatory Visit: Payer: Self-pay

## 2013-07-29 ENCOUNTER — Telehealth: Payer: Self-pay | Admitting: Family Medicine

## 2013-07-29 MED ORDER — BENZONATATE 100 MG PO CAPS: 100.0000 mg | ORAL_CAPSULE | Freq: Three times a day (TID) | ORAL | Status: DC | PRN

## 2013-07-29 MED ORDER — OSELTAMIVIR PHOSPHATE 75 MG PO CAPS: 75.0000 mg | ORAL_CAPSULE | Freq: Two times a day (BID) | ORAL | Status: DC

## 2013-07-29 NOTE — Telephone Encounter (Addendum)
tamiflu 75mg  bid 5 days and Tessalon Perles 100mg  one TID PRN #[id number] per Dr Nicki Reaper

## 2013-07-29 NOTE — Telephone Encounter (Signed)
Pts spouse seen yesterday with the flu, now has cough, chills, body aches, fever  Can she gt some tamiflu called into   [facility]

## 2013-07-29 NOTE — Telephone Encounter (Signed)
Rxs sent electronically to [facility]. Patient notified.

## 2013-07-29 NOTE — Telephone Encounter (Signed)
Pt also thinks she may need Hycodan as well

## 2013-08-07 ENCOUNTER — Ambulatory Visit (INDEPENDENT_AMBULATORY_CARE_PROVIDER_SITE_OTHER): Payer: BC Managed Care – PPO | Admitting: Family Medicine

## 2013-08-07 ENCOUNTER — Encounter: Payer: Self-pay | Admitting: Family Medicine

## 2013-08-07 VITALS — BP 128/82 | Ht 64.0 in | Wt 263.8 lb

## 2013-08-07 DIAGNOSIS — L0291 Cutaneous abscess, unspecified: Secondary | ICD-10-CM

## 2013-08-07 DIAGNOSIS — L0390 Cellulitis, unspecified: Principal | ICD-10-CM

## 2013-08-07 MED ORDER — DOXYCYCLINE HYCLATE 100 MG PO CAPS: 100.0000 mg | ORAL_CAPSULE | Freq: Two times a day (BID) | ORAL | Status: DC

## 2013-08-07 MED ORDER — FLUCONAZOLE 150 MG PO TABS: 150.0000 mg | ORAL_TABLET | Freq: Once | ORAL | Status: DC

## 2013-08-07 NOTE — Progress Notes (Signed)
   Subjective:    Patient ID: Joanna Reid, female    DOB: Apr 16, 1966, 48 y.o.   MRN: [id number]  HPI  Patient arrives with a knot in private area for 3 weeks. Patient states started off as a small red bump it became worse swollen somewhat fluctuant she was concerned about this area. She decided to come in to have it checked. Never had this before. PMH benign  Review of Systems No fevers does relate some pain and discomfort    Objective:   Physical Exam  Abdomen soft, small abscess noted on the suprapubic region. Not on the vulvar.      Assessment & Plan:  Abscess-verbal consent was obtained. Area was numbed with 1% lidocaine with epinephrine. Betadine applied. #11 blade use to make approximately 1/4 inch laceration cut into the abscess with adequate drainage small packing was placed with bandage patient to do warm compresses several times a day doxycycline for the next 5-7 days followup if ongoing trouble culture taken warning signs discussed.

## 2013-08-10 LAB — WOUND CULTURE
GRAM STAIN: NONE SEEN
Gram Stain: NONE SEEN

## 2014-01-06 ENCOUNTER — Telehealth: Payer: Self-pay

## 2014-01-06 NOTE — Telephone Encounter (Signed)
Please review lab results.

## 2014-01-15 NOTE — Telephone Encounter (Signed)
Rev eiewed plz scan

## 2014-01-18 ENCOUNTER — Ambulatory Visit (INDEPENDENT_AMBULATORY_CARE_PROVIDER_SITE_OTHER): Payer: BC Managed Care – PPO | Admitting: Nurse Practitioner

## 2014-01-18 ENCOUNTER — Encounter: Payer: Self-pay | Admitting: Nurse Practitioner

## 2014-01-18 VITALS — BP 128/88 | Ht 64.0 in | Wt 267.2 lb

## 2014-01-18 DIAGNOSIS — M1A9XX0 Chronic gout, unspecified, without tophus (tophi): Secondary | ICD-10-CM

## 2014-01-18 DIAGNOSIS — I10 Essential (primary) hypertension: Secondary | ICD-10-CM

## 2014-01-18 DIAGNOSIS — R7302 Impaired glucose tolerance (oral): Secondary | ICD-10-CM

## 2014-01-18 DIAGNOSIS — R7309 Other abnormal glucose: Secondary | ICD-10-CM

## 2014-01-18 DIAGNOSIS — M1A00X0 Idiopathic chronic gout, unspecified site, without tophus (tophi): Secondary | ICD-10-CM

## 2014-01-18 DIAGNOSIS — K219 Gastro-esophageal reflux disease without esophagitis: Secondary | ICD-10-CM

## 2014-01-18 MED ORDER — METFORMIN HCL 500 MG PO TABS: 500.0000 mg | ORAL_TABLET | Freq: Every day | ORAL | Status: DC

## 2014-01-18 MED ORDER — BUMETANIDE 1 MG PO TABS: 1.0000 mg | ORAL_TABLET | Freq: Every day | ORAL | Status: DC

## 2014-01-18 MED ORDER — OMEPRAZOLE 20 MG PO CPDR: 20.0000 mg | DELAYED_RELEASE_CAPSULE | Freq: Every day | ORAL | Status: DC

## 2014-01-18 MED ORDER — AMLODIPINE BESYLATE 5 MG PO TABS: 5.0000 mg | ORAL_TABLET | Freq: Every day | ORAL | Status: DC

## 2014-01-18 MED ORDER — LOSARTAN POTASSIUM 50 MG PO TABS: 50.0000 mg | ORAL_TABLET | Freq: Every day | ORAL | Status: DC

## 2014-01-18 MED ORDER — ALLOPURINOL 300 MG PO TABS: 300.0000 mg | ORAL_TABLET | Freq: Every day | ORAL | Status: DC

## 2014-01-20 ENCOUNTER — Encounter: Payer: Self-pay | Admitting: Nurse Practitioner

## 2014-01-20 DIAGNOSIS — I10 Essential (primary) hypertension: Secondary | ICD-10-CM | POA: Insufficient documentation

## 2014-01-20 DIAGNOSIS — M109 Gout, unspecified: Secondary | ICD-10-CM | POA: Insufficient documentation

## 2014-01-20 DIAGNOSIS — K219 Gastro-esophageal reflux disease without esophagitis: Secondary | ICD-10-CM | POA: Insufficient documentation

## 2014-01-20 NOTE — Progress Notes (Signed)
Subjective:  Presents for recheck of HTN. No CP, SOB or edema. Has done well with weight loss and activity. Regular exercise. Had labs done in June at work; copy sent to our office. Unsure about last HgbA1C results. Gets regular GYN physicals. Would like to stop Metformin; has been on this for years. Takes once per day. Gout under control. GERD stable. Also questions whether she has ADD.  Objective:   BP 128/88  Ht 5\' 4"  (1.626 m)  Wt 267 lb 4 oz (121.224 kg)  BMI 45.85 kg/m2 NAD. Alert, oriented. Lungs clear. Heart RRR. abd soft, nontender.   Assessment:  Problem List Items Addressed This Visit     Cardiovascular and Mediastinum   Essential hypertension, benign - Primary   Relevant Medications      amLODIpine (NORVASC) tablet      bumetanide (BUMEX) tablet      losartan (COZAAR) tablet     Digestive   GERD (gastroesophageal reflux disease)   Relevant Medications      omeprazole (PRILOSEC) capsule     Endocrine   Glucose intolerance (impaired glucose tolerance)     Other   Gout       Plan:  Meds ordered this encounter  Medications  . omeprazole (PRILOSEC) 20 MG capsule    Sig: Take 1 capsule (20 mg total) by mouth daily.    Dispense:  90 capsule    Refill:  1    Order Specific Question:  Supervising Provider    Answer:  Mikey Kirschner [[id number]]  . allopurinol (ZYLOPRIM) 300 MG tablet    Sig: Take 1 tablet (300 mg total) by mouth daily.    Dispense:  90 tablet    Refill:  1    Order Specific Question:  Supervising Provider    Answer:  Mikey Kirschner [[id number]]  . amLODipine (NORVASC) 5 MG tablet    Sig: Take 1 tablet (5 mg total) by mouth daily.    Dispense:  90 tablet    Refill:  1    Order Specific Question:  Supervising Provider    Answer:  Mikey Kirschner [[id number]]  . bumetanide (BUMEX) 1 MG tablet    Sig: Take 1 tablet (1 mg total) by mouth daily.    Dispense:  90 tablet    Refill:  1    Order Specific Question:  Supervising Provider    Answer:  Mikey Kirschner [[id number]]  . losartan (COZAAR) 50 MG tablet    Sig: Take 1 tablet (50 mg total) by mouth daily.    Dispense:  90 tablet    Refill:  1    Order Specific Question:  Supervising Provider    Answer:  Mikey Kirschner [[id number]]  . metFORMIN (GLUCOPHAGE) 500 MG tablet    Sig: Take 1 tablet (500 mg total) by mouth daily with breakfast.    Dispense:  90 tablet    Refill:  1    Order Specific Question:  Supervising Provider    Answer:  Mikey Kirschner [[id number]]   Patient to check: if hgbA1C above 5.7, stop Metformin; if less stop Metformin and we will check again at next visit. Given ADHD questionnaire to complete at home and schedule visit to review. Encouraged continued activity and weight loss. Return in about 6 months (around 07/21/2014).

## 2014-02-18 ENCOUNTER — Encounter: Payer: Self-pay | Admitting: Nurse Practitioner

## 2014-02-18 ENCOUNTER — Ambulatory Visit (INDEPENDENT_AMBULATORY_CARE_PROVIDER_SITE_OTHER): Payer: BC Managed Care – PPO | Admitting: Nurse Practitioner

## 2014-02-18 VITALS — BP 138/90 | Ht 64.0 in | Wt 273.0 lb

## 2014-02-18 DIAGNOSIS — F902 Attention-deficit hyperactivity disorder, combined type: Secondary | ICD-10-CM

## 2014-02-18 DIAGNOSIS — F909 Attention-deficit hyperactivity disorder, unspecified type: Secondary | ICD-10-CM

## 2014-02-18 MED ORDER — AMPHETAMINE-DEXTROAMPHET ER 10 MG PO CP24: 10.0000 mg | ORAL_CAPSULE | Freq: Every day | ORAL | Status: DC

## 2014-02-19 ENCOUNTER — Encounter: Payer: Self-pay | Admitting: Nurse Practitioner

## 2014-02-19 DIAGNOSIS — F902 Attention-deficit hyperactivity disorder, combined type: Secondary | ICD-10-CM | POA: Insufficient documentation

## 2014-02-19 NOTE — Progress Notes (Signed)
Subjective:  Presents to discuss possible symptoms of ADD. Has brought her questionnaire with her today. Had routine lab work note for her employer, see scanned notes. States her hemoglobin A1c was low 5.0 area. No history of heart problems. See ADHD questionnaire. Most of the scores are moderate to severe.  Objective:   BP 138/90  Ht 5\' 4"  (1.626 m)  Wt 273 lb (123.832 kg)  BMI 46.84 kg/m2 NAD. Alert, oriented. Lungs clear. Heart regular rate rhythm. ECG normal.  Assessment: Attention deficit hyperactivity disorder (ADHD), combined type - Plan: PR ELECTROCARDIOGRAM, COMPLETE  new diagnosis  Plan:  Meds ordered this encounter  Medications  . amphetamine-dextroamphetamine (ADDERALL XR) 10 MG 24 hr capsule    Sig: Take 1 capsule (10 mg total) by mouth daily.    Dispense:  30 capsule    Refill:  0    Order Specific Question:  Supervising Provider    Answer:  Mikey Kirschner [[id number]]   cautioned about potential adverse effects. DC med and call if any problems. May increase to 20 mg after 10-14 days if needed. Return in about 1 month (around 03/21/2014).

## 2014-03-11 ENCOUNTER — Encounter: Payer: Self-pay | Admitting: Nurse Practitioner

## 2014-03-18 ENCOUNTER — Ambulatory Visit: Payer: BC Managed Care – PPO | Admitting: Nurse Practitioner

## 2014-03-29 ENCOUNTER — Ambulatory Visit (INDEPENDENT_AMBULATORY_CARE_PROVIDER_SITE_OTHER): Payer: BC Managed Care – PPO | Admitting: Nurse Practitioner

## 2014-03-29 ENCOUNTER — Encounter: Payer: Self-pay | Admitting: Nurse Practitioner

## 2014-03-29 VITALS — BP 134/82 | Ht 64.0 in | Wt 270.0 lb

## 2014-03-29 DIAGNOSIS — F902 Attention-deficit hyperactivity disorder, combined type: Secondary | ICD-10-CM

## 2014-03-29 DIAGNOSIS — F909 Attention-deficit hyperactivity disorder, unspecified type: Secondary | ICD-10-CM

## 2014-03-29 MED ORDER — AMPHETAMINE-DEXTROAMPHET ER 20 MG PO CP24: 20.0000 mg | ORAL_CAPSULE | ORAL | Status: DC

## 2014-03-29 MED ORDER — AMPHETAMINE-DEXTROAMPHETAMINE 10 MG PO TABS: ORAL_TABLET | ORAL | Status: DC

## 2014-03-31 ENCOUNTER — Encounter: Payer: Self-pay | Admitting: Nurse Practitioner

## 2014-03-31 NOTE — Progress Notes (Signed)
Subjective:  Presents for recheck. Has seen slight improvement in ADHD symptoms on 10 mg Adderall. No side effects. Sleeping well. Works from 7-3 then has to help with grandchildren in the afternoons. Takes her med around 8:30. Can tell when it wears off.  Objective:   BP 134/82  Ht 5\' 4"  (1.626 m)  Wt 270 lb (122.471 kg)  BMI 46.32 kg/m2 NAD. Alert, oriented. Lungs clear. Heart RRR.   Assessment:  Problem List Items Addressed This Visit     Other   Attention deficit hyperactivity disorder (ADHD), combined type - Primary     Plan:  Meds ordered this encounter  Medications  . amphetamine-dextroamphetamine (ADDERALL XR) 20 MG 24 hr capsule    Sig: Take 1 capsule (20 mg total) by mouth every morning.    Dispense:  30 capsule    Refill:  0    Order Specific Question:  Supervising Provider    Answer:  Mikey Kirschner [[id number]]  . amphetamine-dextroamphetamine (ADDERALL) 10 MG tablet    Sig: One po in the afternoon    Dispense:  30 tablet    Refill:  0    Order Specific Question:  Supervising Provider    Answer:  Mikey Kirschner [[id number]]   Increase morning dose to XR 20 mg. Take 10 mg dose in the afternoons about 7-8 hours after first dose. Call back in a few weeks to advise [name] of how this dosing is doing and to get refills. Call sooner if any problems. Note she has flu vaccine available at work.

## 2014-04-23 ENCOUNTER — Other Ambulatory Visit: Payer: Self-pay

## 2014-05-04 ENCOUNTER — Telehealth: Payer: Self-pay | Admitting: Nurse Practitioner

## 2014-05-04 NOTE — Telephone Encounter (Signed)
Please review patients lab work in [facility].

## 2014-05-04 NOTE — Telephone Encounter (Signed)
Please send to [facility].

## 2014-05-21 ENCOUNTER — Other Ambulatory Visit: Payer: Self-pay | Admitting: Nurse Practitioner

## 2014-05-21 MED ORDER — AMPHETAMINE-DEXTROAMPHETAMINE 10 MG PO TABS: ORAL_TABLET | ORAL | Status: DC

## 2014-05-21 MED ORDER — AMPHETAMINE-DEXTROAMPHET ER 20 MG PO CP24: 20.0000 mg | ORAL_CAPSULE | ORAL | Status: DC

## 2014-06-25 ENCOUNTER — Other Ambulatory Visit: Payer: Self-pay | Admitting: Nurse Practitioner

## 2014-09-24 ENCOUNTER — Other Ambulatory Visit: Payer: Self-pay | Admitting: Nurse Practitioner

## 2014-09-24 ENCOUNTER — Encounter: Payer: Self-pay | Admitting: Nurse Practitioner

## 2014-09-24 MED ORDER — AMPHETAMINE-DEXTROAMPHETAMINE 10 MG PO TABS: ORAL_TABLET | ORAL | Status: DC

## 2014-09-24 MED ORDER — AMPHETAMINE-DEXTROAMPHET ER 20 MG PO CP24: 20.0000 mg | ORAL_CAPSULE | ORAL | Status: DC

## 2014-11-12 ENCOUNTER — Ambulatory Visit: Payer: Self-pay | Admitting: Nurse Practitioner

## 2014-11-23 ENCOUNTER — Encounter (HOSPITAL_COMMUNITY): Payer: Self-pay

## 2014-11-23 ENCOUNTER — Emergency Department (HOSPITAL_COMMUNITY)
Admission: EM | Admit: 2014-11-23 | Discharge: 2014-11-23 | Disposition: A | Discharge status: Home / Self Care | Payer: BLUE CROSS/BLUE SHIELD | Attending: Emergency Medicine | Admitting: Emergency Medicine

## 2014-11-23 ENCOUNTER — Emergency Department (HOSPITAL_COMMUNITY): Payer: BLUE CROSS/BLUE SHIELD

## 2014-11-23 DIAGNOSIS — G43909 Migraine, unspecified, not intractable, without status migrainosus: Secondary | ICD-10-CM | POA: Diagnosis not present

## 2014-11-23 DIAGNOSIS — R002 Palpitations: Secondary | ICD-10-CM | POA: Diagnosis not present

## 2014-11-23 DIAGNOSIS — I10 Essential (primary) hypertension: Secondary | ICD-10-CM | POA: Insufficient documentation

## 2014-11-23 DIAGNOSIS — M109 Gout, unspecified: Secondary | ICD-10-CM | POA: Insufficient documentation

## 2014-11-23 DIAGNOSIS — K219 Gastro-esophageal reflux disease without esophagitis: Secondary | ICD-10-CM | POA: Diagnosis not present

## 2014-11-23 DIAGNOSIS — R079 Chest pain, unspecified: Secondary | ICD-10-CM | POA: Diagnosis present

## 2014-11-23 DIAGNOSIS — E119 Type 2 diabetes mellitus without complications: Secondary | ICD-10-CM | POA: Diagnosis not present

## 2014-11-23 DIAGNOSIS — Z79899 Other long term (current) drug therapy: Secondary | ICD-10-CM | POA: Diagnosis not present

## 2014-11-23 DIAGNOSIS — R0789 Other chest pain: Secondary | ICD-10-CM | POA: Insufficient documentation

## 2014-11-23 LAB — COMPREHENSIVE METABOLIC PANEL
ALT: 17 U/L (ref 14–54)
ANION GAP: 8 (ref 5–15)
AST: 20 U/L (ref 15–41)
Albumin: 3.9 g/dL (ref 3.5–5.0)
Alkaline Phosphatase: 84 U/L (ref 38–126)
BILIRUBIN TOTAL: 0.6 mg/dL (ref 0.3–1.2)
BUN: 12 mg/dL (ref 6–20)
CALCIUM: 9.1 mg/dL (ref 8.9–10.3)
CO2: 25 mmol/L (ref 22–32)
CREATININE: 0.71 mg/dL (ref 0.44–1.00)
Chloride: 104 mmol/L (ref 101–111)
GFR calc Af Amer: >60 mL/min (ref >60)
Glucose, Bld: 109 mg/dL — ABNORMAL HIGH (ref 65–99)
Potassium: 3.3 mmol/L — ABNORMAL LOW (ref 3.5–5.1)
Sodium: 137 mmol/L (ref 135–145)
Total Protein: 7 g/dL (ref 6.5–8.1)

## 2014-11-23 LAB — CBC WITH DIFFERENTIAL/PLATELET
BASOS ABS: 0 10*3/uL (ref 0.0–0.1)
Basophils Relative: 1 % (ref 0–1)
Eosinophils Absolute: 0.1 10*3/uL (ref 0.0–0.7)
Eosinophils Relative: 1 % (ref 0–5)
HCT: 39.8 % (ref 36.0–46.0)
HEMOGLOBIN: 13.3 g/dL (ref 12.0–15.0)
LYMPHS ABS: 2.3 10*3/uL (ref 0.7–4.0)
LYMPHS PCT: 32 % (ref 12–46)
MCH: 28.9 pg (ref 26.0–34.0)
MCHC: 33.4 g/dL (ref 30.0–36.0)
MCV: 86.3 fL (ref 78.0–100.0)
MONO ABS: 0.7 10*3/uL (ref 0.1–1.0)
Monocytes Relative: 9 % (ref 3–12)
NEUTROS ABS: 4.2 10*3/uL (ref 1.7–7.7)
Neutrophils Relative %: 57 % (ref 43–77)
Platelets: 354 10*3/uL (ref 150–400)
RBC: 4.61 MIL/uL (ref 3.87–5.11)
RDW: 14 % (ref 11.5–15.5)
WBC: 7.2 10*3/uL (ref 4.0–10.5)

## 2014-11-23 LAB — TROPONIN I: Troponin I: <0.03 ng/mL (ref <0.031)

## 2014-11-23 MED ORDER — ASPIRIN 81 MG PO CHEW
324.0000 mg | CHEWABLE_TABLET | Freq: Once | ORAL | Status: AC
  Administered 2014-11-23: 324 mg via ORAL
  Filled 2014-11-23: qty 4

## 2014-11-23 MED ORDER — POTASSIUM CHLORIDE CRYS ER 20 MEQ PO TBCR
40.0000 meq | EXTENDED_RELEASE_TABLET | Freq: Once | ORAL | Status: AC
  Administered 2014-11-23: 40 meq via ORAL
  Filled 2014-11-23: qty 2

## 2014-11-23 MED ORDER — MORPHINE SULFATE 4 MG/ML IJ SOLN
4.0000 mg | Freq: Once | INTRAMUSCULAR | Status: AC
  Administered 2014-11-23: 4 mg via INTRAVENOUS
  Filled 2014-11-23: qty 1

## 2014-11-23 NOTE — ED Provider Notes (Signed)
CSN: [id number]     Arrival date & time 11/23/14  1324 History   First MD Initiated Contact with Patient 11/23/14 [phone]     Chief Complaint  Patient presents with  . Chest Pain     (Consider location/radiation/quality/duration/timing/severity/associated sxs/prior Treatment) HPI Comments: 49 year-old female with history of reflux, high blood pressure, glucose intolerance, nonsmoker presents with intermittent palpitations and chest discomfort since Saturday. Non-radiating. Nothing specifically improves or worsens the symptoms. No exertional symptoms. No blood clot risk factors.Patient denies blood clot history, active cancer, recent major trauma or surgery, unilateral leg swelling/ pain, recent long travel, hemoptysis or oral contraceptives.  Primarily palpitations felt left lower chest. No history of similar. No diaphoresis.  Patient is a 49 y.o. female presenting with chest pain. The history is provided by the patient.  Chest Pain Associated symptoms: headache (mild gradual onset) and palpitations   Associated symptoms: no abdominal pain, no back pain, no fever, no shortness of breath and not vomiting     Past Medical History  Diagnosis Date  . HBP (high blood pressure)   . Acid reflux   . Gout   . Migraines   . Allergic rhinitis   . Diabetes mellitus     type 2  . Gout    Past Surgical History  Procedure Laterality Date  . Tonsilectomy, adenoidectomy, bilateral myringotomy and tubes    . Vesicovaginal fistula closure w/ tah    . Eye surgery    . Kidney stones  1998  . Laparoscopic unilateral salpingo oopherectomy  05/14/2012    Procedure: LAPAROSCOPIC UNILATERAL SALPINGO OOPHORECTOMY;  Surgeon: [name], MD;  Location: [facility];  Service: Gynecology;  Laterality: Right;  laparoscopic right salpingo-oophorectomy  . Cesarean section    . Partial hysterectomy    . Hemorrhoid surgery N/A 11/19/2012    Procedure: HEMORRHOIDECTOMY;  Surgeon: Jamesetta So, MD;  Location: [facility];   Service: General;  Laterality: N/A;   Family History  Problem Relation Age of Onset  . Arthritis    . Cancer    . Diabetes    . Hypertension Mother   . Diabetes Father   . Diabetes Brother   . Hypertension Brother    History  Substance Use Topics  . Smoking status: Never Smoker   . Smokeless tobacco: Not on file  . Alcohol Use: No   OB History    No data available     Review of Systems  Constitutional: Negative for fever and chills.  HENT: Negative for congestion.   Eyes: Negative for visual disturbance.  Respiratory: Negative for shortness of breath.   Cardiovascular: Positive for chest pain and palpitations. Negative for leg swelling.  Gastrointestinal: Negative for vomiting and abdominal pain.  Genitourinary: Negative for dysuria and flank pain.  Musculoskeletal: Negative for back pain, neck pain and neck stiffness.  Skin: Negative for rash.  Neurological: Positive for headaches (mild gradual onset). Negative for light-headedness.      Allergies  Review of patient's allergies indicates no known allergies.  Home Medications   Prior to Admission medications   Medication Sig Start Date End Date Taking? Authorizing Provider  allopurinol (ZYLOPRIM) 300 MG tablet TAKE 1 TABLET DAILY 09/24/14  Yes Nilda Simmer, NP  amLODipine (NORVASC) 5 MG tablet Take 1 tablet (5 mg total) by mouth daily. 01/18/14  Yes Nilda Simmer, NP  amphetamine-dextroamphetamine (ADDERALL XR) 20 MG 24 hr capsule Take 1 capsule (20 mg total) by mouth every morning. 09/24/14  Yes  Nilda Simmer, NP  amphetamine-dextroamphetamine (ADDERALL) 10 MG tablet One po in the afternoon 09/24/14  Yes Nilda Simmer, NP  bumetanide (BUMEX) 1 MG tablet Take 1 tablet (1 mg total) by mouth daily. 01/18/14  Yes Nilda Simmer, NP  colchicine 0.6 MG tablet Take 0.6 mg by mouth as needed.   Yes Historical Provider, MD  docusate sodium (COLACE) 100 MG capsule Take 100 mg by mouth daily as needed for mild  constipation.   Yes Historical Provider, MD  fluticasone (FLONASE) 50 MCG/ACT nasal spray Place 2 sprays into the nose 2 (two) times daily.   Yes Historical Provider, MD  losartan (COZAAR) 100 MG tablet Take 1 tablet by mouth daily. 11/19/14  Yes Historical Provider, MD  metFORMIN (GLUCOPHAGE) 500 MG tablet Take 1 tablet (500 mg total) by mouth daily with breakfast. 01/18/14  Yes Nilda Simmer, NP  omeprazole (PRILOSEC) 20 MG capsule TAKE 1 CAPSULE DAILY 06/25/14  Yes Nilda Simmer, NP  losartan (COZAAR) 50 MG tablet Take 1 tablet (50 mg total) by mouth daily. Patient not taking: Reported on 11/23/2014 01/18/14   Nilda Simmer, NP   BP 157/114 mmHg  Pulse 78  Temp(Src) 98 F (36.7 C) (Oral)  Resp 16  Ht 5\' 3"  (1.6 m)  Wt 256 lb (116.121 kg)  BMI 45.36 kg/m2  SpO2 100% Physical Exam  Constitutional: She is oriented to person, place, and time. She appears well-developed and well-nourished.  HENT:  Head: Normocephalic and atraumatic.  Eyes: Conjunctivae are normal. Right eye exhibits no discharge. Left eye exhibits no discharge.  Neck: Normal range of motion. Neck supple. No tracheal deviation present.  Cardiovascular: Normal rate, regular rhythm and intact distal pulses.   No murmur heard. Pulmonary/Chest: Effort normal and breath sounds normal.  Abdominal: Soft. She exhibits no distension. There is no tenderness. There is no guarding.  Musculoskeletal: She exhibits no edema.  Neurological: She is alert and oriented to person, place, and time. No cranial nerve deficit.  Skin: Skin is warm. No rash noted.  Psychiatric: She has a normal mood and affect.  Nursing note and vitals reviewed.   ED Course  Procedures (including critical care time) Labs Review Labs Reviewed  COMPREHENSIVE METABOLIC PANEL - Abnormal; Notable for the following:    Potassium 3.3 (*)    Glucose, Bld 109 (*)    All other components within normal limits  CBC WITH DIFFERENTIAL/PLATELET  TROPONIN I   TROPONIN I    Imaging Review Dg Chest 2 View  11/23/2014   CLINICAL DATA:  Chest pain, left-sided pain for 3 days  EXAM: CHEST  2 VIEW  COMPARISON:  04/13/2005  FINDINGS: The heart size and mediastinal contours are within normal limits. Both lungs are clear. The visualized skeletal structures are unremarkable.  IMPRESSION: No active cardiopulmonary disease.   Electronically Signed   By: Kathreen Devoid   On: 11/23/2014 10:29     EKG Interpretation   Date/Time:  Tuesday Nov 23 2014 09:32:31 EDT Ventricular Rate:  80 PR Interval:  185 QRS Duration: 81 QT Interval:  399 QTC Calculation: 460 R Axis:   51 Text Interpretation:  Sinus rhythm Multiple ventricular premature  complexes LAE, consider biatrial enlargement V3 changes and poor baseline,  otherwise similar to 2013 Baseline wander in lead(s) II aVF Confirmed by  Jaymz Traywick  MD, Albina Gosney ([id number]) on 11/23/2014 10:00:13 AM      MDM   Final diagnoses:  Heart palpitations  Chest discomfort   Well-appearing  female with a few cardiac risk factors presents with atypical chest discomfort and palpitations since Saturday. Patient sent over from work for further evaluation. No blood clot risk factors.  Patient is not had a recent stress test. Initial troponin negative, EKG no acute ischemic findings. Plan for observation ER, delta troponin and discussion for observation in the hospital for stress test versus close outpatient follow-up. Aspirin ordered.  Patient has a heart score 3-4 range, had a shared decision-making with the patient comparing observation versus close outpatient follow-up for further workup which may include stress test. Patient has no chest pain but does have intermittent palpitations. Patient prefers to follow-up closely outpatient primary Dr. And outpatient cardiology.  Results and differential diagnosis were discussed with the patient/parent/guardian. Close follow up outpatient was discussed, comfortable with the plan.    Medications  morphine 4 MG/ML injection 4 mg (4 mg Intravenous Given 11/23/14 1037)  aspirin chewable tablet 324 mg (324 mg Oral Given 11/23/14 1035)  potassium chloride SA (K-DUR,KLOR-CON) CR tablet 40 mEq (40 mEq Oral Given 11/23/14 1222)    Filed Vitals:   11/23/14 1200 11/23/14 1230 11/23/14 1300 11/23/14 1330  BP: 130/99 149/100 157/114 117/70  Pulse: 76 73 78 73  Temp:      TempSrc:      Resp: 14 14 16 14   Height:      Weight:      SpO2: 93% 97% 100% 100%    Final diagnoses:  Heart palpitations  Chest discomfort       Elnora Morrison, MD 11/23/14 1353

## 2014-11-23 NOTE — ED Notes (Signed)
Complain of chest pain in center of chest that started yesterday

## 2014-11-23 NOTE — Discharge Instructions (Signed)
If you were given medicines take as directed.  If you are on coumadin or contraceptives realize their levels and effectiveness is altered by many different medicines.  If you have any reaction (rash, tongues swelling, other) to the medicines stop taking and see a physician.  Have blood pressure rechecked by primary doctor.  Please follow up as directed and return to the ER or see a physician for new or worsening symptoms.  Thank you. Filed Vitals:   11/23/14 1100 11/23/14 1200 11/23/14 1230 11/23/14 1300  BP: 135/93 130/99 149/100 157/114  Pulse: 70 76 73 78  Temp:      TempSrc:      Resp: 13 14 14 16   Height:      Weight:      SpO2: 95% 93% 97% 100%

## 2014-11-24 ENCOUNTER — Encounter: Payer: Self-pay | Admitting: Family Medicine

## 2014-11-24 ENCOUNTER — Ambulatory Visit (INDEPENDENT_AMBULATORY_CARE_PROVIDER_SITE_OTHER): Payer: BLUE CROSS/BLUE SHIELD | Admitting: Family Medicine

## 2014-11-24 VITALS — BP 140/104 | Ht 64.0 in | Wt 258.0 lb

## 2014-11-24 DIAGNOSIS — R079 Chest pain, unspecified: Secondary | ICD-10-CM | POA: Diagnosis not present

## 2014-11-24 DIAGNOSIS — I10 Essential (primary) hypertension: Secondary | ICD-10-CM

## 2014-11-24 MED ORDER — POTASSIUM CHLORIDE CRYS ER 20 MEQ PO TBCR: 20.0000 meq | EXTENDED_RELEASE_TABLET | Freq: Every day | ORAL | Status: DC

## 2014-11-24 NOTE — Progress Notes (Signed)
   Subjective:    Patient ID: Joanna Reid, female    DOB: 13-Oct-1965, 49 y.o.   MRN: [id number] Patient arrives office with multiple concerns.   HPIFollow up ED for heart palpitations. Also had a pressure-like sensation in the chest. Not no obvious nausea diaphoresis or shortness breath.  Had multiple cardiac enzymes within normal limits EKG and telemetry was within normal limits.  Under tremendous stress at work. She feels she is doing the job 2 or 3 people currently. Often notes a sensation of chest pressure and going into work just at the thought of all her tasks that she is facing throughout the day.  Sob with exertion. No true chest pain with exertion. Patient kicked boxes several times per week.  Emergency room no completely reviewed   [phone]   Review of Systems Mild headache some diminished energy some trouble sleeping no change in bowel habits no rash    Objective:   Physical Exam  Alert vitals stable blood pressure improved on repeat though not ideal 140/88 HEENT normal lungs clear. Heart regular in rhythm. Ankles without edema.  Hospital records reviewed along with blood work      Assessment & Plan:  #1 chest symptoms somewhat atypical however patient does have some risk factors discussed #2 hypertension suboptimal in control #3 hypokalemia plan add potassium supplement. Cardiology referral. Blood pressures generally good up to now we'll hold off on increasing medicine for now. Potassium supplement might help a little in this regard to [facility]

## 2014-11-24 NOTE — Patient Instructions (Signed)
Take one baby aspirin daily until your visit with the heart doctors

## 2014-12-03 ENCOUNTER — Ambulatory Visit (INDEPENDENT_AMBULATORY_CARE_PROVIDER_SITE_OTHER): Payer: BLUE CROSS/BLUE SHIELD | Admitting: Cardiology

## 2014-12-03 ENCOUNTER — Encounter: Payer: Self-pay | Admitting: Cardiology

## 2014-12-03 VITALS — BP 152/99 | HR 84 | Ht 64.5 in | Wt 262.4 lb

## 2014-12-03 DIAGNOSIS — R002 Palpitations: Secondary | ICD-10-CM

## 2014-12-03 DIAGNOSIS — R600 Localized edema: Secondary | ICD-10-CM

## 2014-12-03 DIAGNOSIS — I10 Essential (primary) hypertension: Secondary | ICD-10-CM

## 2014-12-03 DIAGNOSIS — R0602 Shortness of breath: Secondary | ICD-10-CM

## 2014-12-03 NOTE — Progress Notes (Signed)
Clinical Summary Joanna Reid is a 49 y.o.female seen today as a new patient for the following medical problems.  1. Palpitations - seen in ER 11/23/14 with chest pain.  - trop neg x2, EKG NSR, CXR no acute process K 3.3  - first episode 1 week ago.. Feeling of heart pounding. No SOB, no lightheadned, no dizziness. No chest pain. Symptoms lasted approx 1 hour.  - one mountain dew ofrcoffee in AM, no significant tea. No energy drinks. No EtoH - following morning around 7AM, feeling of heart pounding. Felt nauseous. On average 2 episodes per week - has been on adderall since  July   2. Leg swelling - reports long history of LE edema - started on diuretics by one of her providers a few years ago, currently on bumbex    Past Medical History  Diagnosis Date  . HBP (high blood pressure)   . Acid reflux   . Gout   . Migraines   . Allergic rhinitis   . Diabetes mellitus     type 2  . Gout      No Known Allergies   Current Outpatient Prescriptions  Medication Sig Dispense Refill  . allopurinol (ZYLOPRIM) 300 MG tablet TAKE 1 TABLET DAILY 90 tablet 0  . amLODipine (NORVASC) 5 MG tablet Take 1 tablet (5 mg total) by mouth daily. 90 tablet 1  . amphetamine-dextroamphetamine (ADDERALL XR) 20 MG 24 hr capsule Take 1 capsule (20 mg total) by mouth every morning. 30 capsule 0  . amphetamine-dextroamphetamine (ADDERALL) 10 MG tablet One po in the afternoon 30 tablet 0  . aspirin 81 MG tablet Take 81 mg by mouth daily.    . bumetanide (BUMEX) 1 MG tablet Take 1 tablet (1 mg total) by mouth daily. 90 tablet 1  . colchicine 0.6 MG tablet Take 0.6 mg by mouth as needed.    . docusate sodium (COLACE) 100 MG capsule Take 100 mg by mouth daily as needed for mild constipation.    . fluticasone (FLONASE) 50 MCG/ACT nasal spray Place 2 sprays into the nose 2 (two) times daily.    Marland Kitchen losartan (COZAAR) 100 MG tablet Take 1 tablet by mouth daily.    . metFORMIN (GLUCOPHAGE) 500 MG tablet Take  1 tablet (500 mg total) by mouth daily with breakfast. 90 tablet 1  . omeprazole (PRILOSEC) 20 MG capsule TAKE 1 CAPSULE DAILY 90 capsule 0  . potassium chloride SA (K-DUR,KLOR-CON) 20 MEQ tablet Take 1 tablet (20 mEq total) by mouth daily. 30 tablet 5   No current facility-administered medications for this visit.     Past Surgical History  Procedure Laterality Date  . Tonsilectomy, adenoidectomy, bilateral myringotomy and tubes    . Vesicovaginal fistula closure w/ tah    . Eye surgery    . Kidney stones  1998  . Laparoscopic unilateral salpingo oopherectomy  05/14/2012    Procedure: LAPAROSCOPIC UNILATERAL SALPINGO OOPHORECTOMY;  Surgeon: [name], MD;  Location: [facility];  Service: Gynecology;  Laterality: Right;  laparoscopic right salpingo-oophorectomy  . Cesarean section    . Partial hysterectomy    . Hemorrhoid surgery N/A 11/19/2012    Procedure: HEMORRHOIDECTOMY;  Surgeon: Jamesetta So, MD;  Location: [facility];  Service: General;  Laterality: N/A;     No Known Allergies    Family History  Problem Relation Age of Onset  . Arthritis    . Cancer    . Diabetes    . Hypertension Mother   .  Diabetes Father   . Diabetes Brother   . Hypertension Brother      Social History Ms. Shenk reports that she has never smoked. She has never used smokeless tobacco. Ms. Baumbach reports that she does not drink alcohol.   Review of Systems CONSTITUTIONAL: No weight loss, fever, chills, weakness or fatigue.  HEENT: Eyes: No visual loss, blurred vision, double vision or yellow sclerae.No hearing loss, sneezing, congestion, runny nose or sore throat.  SKIN: No rash or itching.  CARDIOVASCULAR: per  hpi RESPIRATORY: No shortness of breath, cough or sputum.  GASTROINTESTINAL: No anorexia, nausea, vomiting or diarrhea. No abdominal pain or blood.  GENITOURINARY: No burning on urination, no polyuria NEUROLOGICAL: No headache, dizziness, syncope, paralysis, ataxia, numbness or  tingling in the extremities. No change in bowel or bladder control.  MUSCULOSKELETAL: No muscle, back pain, joint pain or stiffness.  LYMPHATICS: No enlarged nodes. No history of splenectomy.  PSYCHIATRIC: No history of depression or anxiety.  ENDOCRINOLOGIC: No reports of sweating, cold or heat intolerance. No polyuria or polydipsia.  Marland Kitchen   Physical Examination Filed Vitals:   12/03/14 1323  BP: 152/99  Pulse: 84   Filed Vitals:   12/03/14 1323  Height: 5' 4.5" (1.638 m)  Weight: 262 lb 6.4 oz (119.024 kg)    Gen: resting comfortably, no acute distress HEENT: no scleral icterus, pupils equal round and reactive, no palptable cervical adenopathy,  CV: RRR, no m/r/g, no JVD Resp: Clear to auscultation bilaterally GI: abdomen is soft, non-tender, non-distended, normal bowel sounds, no hepatosplenomegaly MSK: extremities are warm, no edema.  Skin: warm, no rash Neuro:  no focal deficits Psych: appropriate affect    Assessment and Plan  1. Palpitations - check BMET, Mg, TSH - will obtain 7 day monitor  2. LE edema - reports long history of leg edema, unclear etiology, has never had echo - will obtain echo   F/u 1 month      Arnoldo Lenis, M.D.

## 2014-12-03 NOTE — Patient Instructions (Signed)
Your physician has recommended that you wear a 7 day event monitor. Event monitors are medical devices that record the heart's electrical activity. Doctors most often [name] these monitors to diagnose arrhythmias. Arrhythmias are problems with the speed or rhythm of the heartbeat. The monitor is a small, portable device. You can wear one while you do your normal daily activities. This is usually used to diagnose what is causing palpitations/syncope (passing out). Labs for BMET, TSH, Magnesium  Your physician has requested that you have an echocardiogram. Echocardiography is a painless test that uses sound waves to create images of your heart. It provides your doctor with information about the size and shape of your heart and how well your heart's chambers and valves are working. This procedure takes approximately one hour. There are no restrictions for this procedure. Office will contact with results via phone or letter.   Follow up in  1 month

## 2014-12-08 ENCOUNTER — Other Ambulatory Visit: Payer: Self-pay | Admitting: Cardiology

## 2014-12-08 ENCOUNTER — Ambulatory Visit (HOSPITAL_COMMUNITY)
Admission: RE | Admit: 2014-12-08 | Discharge: 2014-12-08 | Disposition: A | Discharge status: Home / Self Care | Payer: BLUE CROSS/BLUE SHIELD | Source: Ambulatory Visit | Attending: Cardiology | Admitting: Cardiology

## 2014-12-08 DIAGNOSIS — R0602 Shortness of breath: Secondary | ICD-10-CM | POA: Diagnosis not present

## 2014-12-08 LAB — BASIC METABOLIC PANEL
BUN: 13 mg/dL (ref 6–23)
CALCIUM: 9.1 mg/dL (ref 8.4–10.5)
CO2: 23 mEq/L (ref 19–32)
Chloride: 103 mEq/L (ref 96–112)
Creat: 0.69 mg/dL (ref 0.50–1.10)
GLUCOSE: 134 mg/dL — AB (ref 70–99)
Potassium: 3.8 mEq/L (ref 3.5–5.3)
Sodium: 140 mEq/L (ref 135–145)

## 2014-12-08 LAB — TSH: TSH: 2.628 u[IU]/mL (ref 0.350–4.500)

## 2014-12-08 LAB — MAGNESIUM: Magnesium: 1.6 mg/dL (ref 1.5–2.5)

## 2014-12-09 ENCOUNTER — Encounter (INDEPENDENT_AMBULATORY_CARE_PROVIDER_SITE_OTHER): Payer: BLUE CROSS/BLUE SHIELD

## 2014-12-09 ENCOUNTER — Telehealth: Payer: Self-pay | Admitting: *Deleted

## 2014-12-09 DIAGNOSIS — R002 Palpitations: Secondary | ICD-10-CM | POA: Diagnosis not present

## 2014-12-09 NOTE — Telephone Encounter (Signed)
Pt made aware, routed to pcp

## 2014-12-09 NOTE — Telephone Encounter (Signed)
-----   Message from Arnoldo Lenis, MD sent at 12/09/2014  8:52 AM EDT ----- Labs look good  Zandra Abts MD

## 2014-12-16 ENCOUNTER — Ambulatory Visit (INDEPENDENT_AMBULATORY_CARE_PROVIDER_SITE_OTHER): Payer: BLUE CROSS/BLUE SHIELD | Admitting: Nurse Practitioner

## 2014-12-16 ENCOUNTER — Ambulatory Visit (HOSPITAL_COMMUNITY)
Admission: RE | Admit: 2014-12-16 | Discharge: 2014-12-16 | Disposition: A | Discharge status: Home / Self Care | Payer: BLUE CROSS/BLUE SHIELD | Source: Ambulatory Visit | Attending: Nurse Practitioner | Admitting: Nurse Practitioner

## 2014-12-16 ENCOUNTER — Encounter: Payer: Self-pay | Admitting: Nurse Practitioner

## 2014-12-16 VITALS — BP 148/98 | Ht 64.0 in | Wt 264.2 lb

## 2014-12-16 DIAGNOSIS — M25571 Pain in right ankle and joints of right foot: Secondary | ICD-10-CM | POA: Insufficient documentation

## 2014-12-16 DIAGNOSIS — S93401A Sprain of unspecified ligament of right ankle, initial encounter: Secondary | ICD-10-CM | POA: Diagnosis not present

## 2014-12-16 DIAGNOSIS — M7989 Other specified soft tissue disorders: Secondary | ICD-10-CM | POA: Diagnosis not present

## 2014-12-16 DIAGNOSIS — W19XXXA Unspecified fall, initial encounter: Secondary | ICD-10-CM | POA: Insufficient documentation

## 2014-12-16 MED ORDER — HYDROCODONE-ACETAMINOPHEN 5-325 MG PO TABS
1.0000 | ORAL_TABLET | ORAL | Status: DC | PRN
Start: 2014-12-16 — End: 2015-07-21

## 2014-12-20 ENCOUNTER — Encounter: Payer: Self-pay | Admitting: Nurse Practitioner

## 2014-12-20 NOTE — Progress Notes (Signed)
Subjective:  Presents with complaints of swelling and pain in the right ankle after falling on 12/06/2014. Has tried ibuprofen Ace wrap and ice packs with some improvement. Pain now going up into the lateral calf area. Is able to do weightbearing but still very tender. Pain is worse at nighttime after she is been on her feet, difficulty sleeping.  Objective:   BP 148/98 mmHg  Ht 5\' 4"  (1.626 m)  Wt 264 lb 4 oz (119.863 kg)  BMI 45.34 kg/m2 NAD. Alert, oriented. Strong right DP pulse. Area of edema noted around the medial malleolus, no erythema. Tender to palpation going up into the lateral leg area about mid calf. Calf circumference very close bilateral. No masses erythema or warmth in the calf area. Tenderness with flexion and inversion of the ankle. No excessive joint laxity.  Assessment: Sprain of ankle, right, initial encounter - Plan: DG Ankle Complete Right  Plan:  Meds ordered this encounter  Medications  . HYDROcodone-acetaminophen (NORCO/VICODIN) 5-325 MG per tablet    Sig: Take 1 tablet by mouth every 4 (four) hours as needed.    Dispense:  20 tablet    Refill:  0    Order Specific Question:  Supervising Provider    Answer:  Mikey Kirschner [[id number]]   Recommend ankle support x-ray and continue other measures. Hydrocodone for nighttime use for severe pain drowsiness precautions. Return if symptoms worsen or fail to improve.

## 2014-12-22 ENCOUNTER — Telehealth: Payer: Self-pay | Admitting: *Deleted

## 2014-12-22 NOTE — Telephone Encounter (Signed)
-----   Message from Arnoldo Lenis, MD sent at 12/20/2014  2:39 PM EDT ----- Heart monitor looks good, no abnormal rhythms. Will discuss further at f/u  Zandra Abts MD

## 2014-12-22 NOTE — Telephone Encounter (Signed)
Pt aware, routed to pcp 

## 2014-12-23 ENCOUNTER — Ambulatory Visit: Payer: BLUE CROSS/BLUE SHIELD | Admitting: Nurse Practitioner

## 2014-12-31 ENCOUNTER — Other Ambulatory Visit: Payer: Self-pay | Admitting: Nurse Practitioner

## 2014-12-31 ENCOUNTER — Ambulatory Visit: Payer: BLUE CROSS/BLUE SHIELD | Admitting: Cardiology

## 2015-01-03 ENCOUNTER — Encounter: Payer: Self-pay | Admitting: Family Medicine

## 2015-01-03 ENCOUNTER — Other Ambulatory Visit: Payer: Self-pay

## 2015-01-13 ENCOUNTER — Ambulatory Visit (INDEPENDENT_AMBULATORY_CARE_PROVIDER_SITE_OTHER): Payer: BLUE CROSS/BLUE SHIELD | Admitting: Nurse Practitioner

## 2015-01-13 ENCOUNTER — Encounter: Payer: Self-pay | Admitting: Nurse Practitioner

## 2015-01-13 VITALS — BP 124/90 | Ht 64.0 in | Wt 262.2 lb

## 2015-01-13 DIAGNOSIS — F411 Generalized anxiety disorder: Secondary | ICD-10-CM | POA: Insufficient documentation

## 2015-01-13 DIAGNOSIS — F419 Anxiety disorder, unspecified: Secondary | ICD-10-CM | POA: Diagnosis not present

## 2015-01-13 DIAGNOSIS — F430 Acute stress reaction: Principal | ICD-10-CM

## 2015-01-13 MED ORDER — ESCITALOPRAM OXALATE 10 MG PO TABS: 10.0000 mg | ORAL_TABLET | Freq: Every day | ORAL | Status: DC

## 2015-01-13 NOTE — Patient Instructions (Addendum)
My fitness pal Melatonin 5 mg

## 2015-01-13 NOTE — Progress Notes (Signed)
Subjective:  Presents for recheck. Had complete cardiac workup for palpitations which was normal. Because of her symptoms, patient has not been doing her kickboxing for several weeks. Has a very stressful job working for a [employer]. Has had increased responsibilities lately. Emotional lability. Continued occasional palpitations. Was told by cardiology that it was probably related to anxiety. Palpitations occurred off of Adderall.  Objective:   BP 124/90 mmHg  Ht 5\' 4"  (1.626 m)  Wt 262 lb 4 oz (118.956 kg)  BMI 44.99 kg/m2 NAD. Alert, oriented. Mildly anxious affect. Thoughts logical coherent and relevant. Lungs clear. Heart regular rate rhythm.  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 escitalopram (LEXAPRO) 10 MG tablet    Sig: Take 1 tablet (10 mg total) by mouth daily.    Dispense:  30 tablet    Refill:  0    Order Specific Question:  Supervising Provider    Answer:  Mikey Kirschner [[id number]]   Hold on Adderall at this point. Trial of Lexapro start with one half tab daily for several days then if tolerated increase to one tab. Discussed importance of resuming her exercise regimen. DC med and call if any problems. Return in about 1 month (around 02/13/2015).

## 2015-02-15 ENCOUNTER — Ambulatory Visit (INDEPENDENT_AMBULATORY_CARE_PROVIDER_SITE_OTHER): Payer: BLUE CROSS/BLUE SHIELD | Admitting: Nurse Practitioner

## 2015-02-15 ENCOUNTER — Encounter: Payer: Self-pay | Admitting: Nurse Practitioner

## 2015-02-15 VITALS — BP 138/92 | Ht 64.0 in | Wt 266.0 lb

## 2015-02-15 DIAGNOSIS — F902 Attention-deficit hyperactivity disorder, combined type: Secondary | ICD-10-CM | POA: Diagnosis not present

## 2015-02-15 DIAGNOSIS — F419 Anxiety disorder, unspecified: Secondary | ICD-10-CM

## 2015-02-15 DIAGNOSIS — I10 Essential (primary) hypertension: Secondary | ICD-10-CM

## 2015-02-15 DIAGNOSIS — F411 Generalized anxiety disorder: Secondary | ICD-10-CM

## 2015-02-15 DIAGNOSIS — F430 Acute stress reaction: Secondary | ICD-10-CM

## 2015-02-15 MED ORDER — AMPHETAMINE-DEXTROAMPHET ER 20 MG PO CP24: 20.0000 mg | ORAL_CAPSULE | ORAL | Status: DC

## 2015-02-15 MED ORDER — ESCITALOPRAM OXALATE 10 MG PO TABS: 10.0000 mg | ORAL_TABLET | Freq: Every day | ORAL | Status: DC

## 2015-02-15 NOTE — Progress Notes (Signed)
Subjective:   Presents for follow-up on her anxiety and stress. Doing very well on Lexapro 10 mg. Has noticed great improvement in the way she still with stress, her family has also noted improvement. Doing well on 10 mg daily. Still having some trouble organizing her thoughts at work. Does not need an evening dose of Adderall would like to restart the dose for when she is working. The nurse practitioner at her job has written her a prescription for valsartan 320 mg, recommend that she switch from losartan to see if this will help with her blood pressure which has been running high outside of the office.  Recent range has been 148 -160/100-119.Try to increase amlodipine to 10 mg but this did not help. Having quite a bit of edema in the lower legs especially in the evenings. Has a desk job. Urinating frequently only at nighttime.  Objective:   BP 138/92 mmHg  Ht 5\' 4"  (1.626 m)  Wt 266 lb (120.657 kg)  BMI 45.64 kg/m2  NAD. Alert, oriented. Cheerful affect. Lungs clear. Heart regular rate rhythm. No murmur or gallop noted. BP on recheck right arm sitting 156/100.  Trace nonpitting edema lower extremities.  Assessment:  Problem List Items Addressed This Visit      Cardiovascular and Mediastinum   Essential hypertension, benign - Primary   Relevant Medications   valsartan (DIOVAN) 320 MG tablet     Other   Anxiety as acute reaction to exceptional stress   Relevant Medications   escitalopram (LEXAPRO) 10 MG tablet   Attention deficit hyperactivity disorder (ADHD), combined type     Plan:  Meds ordered this encounter  Medications  . valsartan (DIOVAN) 320 MG tablet    Sig: Take 320 mg by mouth daily.  Marland Kitchen escitalopram (LEXAPRO) 10 MG tablet    Sig: Take 1 tablet (10 mg total) by mouth daily.    Dispense:  30 tablet    Refill:  2    Order Specific Question:  Supervising Provider    Answer:  Mikey Kirschner [[id number]]  . DISCONTD: amphetamine-dextroamphetamine (ADDERALL XR) 20 MG 24 hr  capsule    Sig: Take 1 capsule (20 mg total) by mouth every morning.    Dispense:  30 capsule    Refill:  0    Order Specific Question:  Supervising Provider    Answer:  Mikey Kirschner [[id number]]  . DISCONTD: amphetamine-dextroamphetamine (ADDERALL XR) 20 MG 24 hr capsule    Sig: Take 1 capsule (20 mg total) by mouth every morning.    Dispense:  30 capsule    Refill:  0    May fill 30 days from 02/15/15    Order Specific Question:  Supervising Provider    Answer:  Mikey Kirschner [[id number]]  . amphetamine-dextroamphetamine (ADDERALL XR) 20 MG 24 hr capsule    Sig: Take 1 capsule (20 mg total) by mouth every morning.    Dispense:  30 capsule    Refill:  0    May fill 60 days from 02/15/15    Order Specific Question:  Supervising Provider    Answer:  Mikey Kirschner [[id number]]    stop losartan. Switch to valsartan 320 mg as previously prescribed. Continue other medications as directed. Restart Adderall  As directed on the days that she works. Discussed importance of  Weight loss, low-sodium diet and regular activity. Warning signs reviewed. Patient to call back after a few weeks of new blood pressure pill if no improvement in blood pressure  or in fluid. Return in about 3 months (around 05/18/2015) for recheck.

## 2015-05-27 ENCOUNTER — Other Ambulatory Visit: Payer: Self-pay | Admitting: Nurse Practitioner

## 2015-06-29 ENCOUNTER — Other Ambulatory Visit: Payer: Self-pay | Admitting: Nurse Practitioner

## 2015-07-21 ENCOUNTER — Encounter: Payer: Self-pay | Admitting: Nurse Practitioner

## 2015-07-21 ENCOUNTER — Encounter: Payer: Self-pay | Admitting: Family Medicine

## 2015-07-21 ENCOUNTER — Ambulatory Visit (INDEPENDENT_AMBULATORY_CARE_PROVIDER_SITE_OTHER): Payer: BLUE CROSS/BLUE SHIELD | Admitting: Nurse Practitioner

## 2015-07-21 VITALS — BP 152/96 | Temp 98.8°F | Ht 64.0 in | Wt 275.0 lb

## 2015-07-21 DIAGNOSIS — R062 Wheezing: Secondary | ICD-10-CM | POA: Diagnosis not present

## 2015-07-21 DIAGNOSIS — R7303 Prediabetes: Secondary | ICD-10-CM | POA: Diagnosis not present

## 2015-07-21 DIAGNOSIS — F419 Anxiety disorder, unspecified: Secondary | ICD-10-CM | POA: Diagnosis not present

## 2015-07-21 DIAGNOSIS — J209 Acute bronchitis, unspecified: Secondary | ICD-10-CM

## 2015-07-21 DIAGNOSIS — F411 Generalized anxiety disorder: Secondary | ICD-10-CM

## 2015-07-21 DIAGNOSIS — F430 Acute stress reaction: Secondary | ICD-10-CM

## 2015-07-21 DIAGNOSIS — I10 Essential (primary) hypertension: Secondary | ICD-10-CM

## 2015-07-21 DIAGNOSIS — J069 Acute upper respiratory infection, unspecified: Secondary | ICD-10-CM | POA: Diagnosis not present

## 2015-07-21 LAB — POCT GLYCOSYLATED HEMOGLOBIN (HGB A1C): Hemoglobin A1C: 6.5

## 2015-07-21 MED ORDER — NALTREXONE-BUPROPION HCL ER 8-90 MG PO TB12: ORAL_TABLET | ORAL | Status: DC

## 2015-07-21 MED ORDER — AMLODIPINE BESYLATE 5 MG PO TABS: 5.0000 mg | ORAL_TABLET | Freq: Every day | ORAL | Status: DC

## 2015-07-21 MED ORDER — HYDROCODONE-HOMATROPINE 5-1.5 MG/5ML PO SYRP: 5.0000 mL | ORAL_SOLUTION | ORAL | Status: DC | PRN

## 2015-07-21 MED ORDER — PREDNISONE 20 MG PO TABS: ORAL_TABLET | ORAL | Status: DC

## 2015-07-21 MED ORDER — ESCITALOPRAM OXALATE 10 MG PO TABS: 10.0000 mg | ORAL_TABLET | Freq: Every day | ORAL | Status: DC

## 2015-07-21 MED ORDER — BUMETANIDE 1 MG PO TABS: 1.0000 mg | ORAL_TABLET | Freq: Every day | ORAL | Status: DC

## 2015-07-21 MED ORDER — ALBUTEROL SULFATE (2.5 MG/3ML) 0.083% IN NEBU
2.5000 mg | INHALATION_SOLUTION | Freq: Once | RESPIRATORY_TRACT | Status: AC
  Administered 2015-07-21: 2.5 mg via RESPIRATORY_TRACT

## 2015-07-21 MED ORDER — VALSARTAN 320 MG PO TABS: 320.0000 mg | ORAL_TABLET | Freq: Every day | ORAL | Status: DC

## 2015-07-22 ENCOUNTER — Encounter: Payer: Self-pay | Admitting: Nurse Practitioner

## 2015-07-22 NOTE — Progress Notes (Signed)
Subjective:  Presents for routine follow up. Has been taking decongestants. BP outside of office recently 144/80. No CP/ischemic type pain. Anxiety stable. Good support system. Has not done well with weight loss or activity lately. Also began having pain in her ears 5 d ago. Began running fever of 101 yesterday. Started a Zpack 2 d ago given by NP at her work. Also, cough, sore throat, facial area headache and now having wheezing. Has started back using albuterol inhaler.   Objective:   BP 152/96 mmHg  Temp(Src) 98.8 F (37.1 C) (Oral)  Ht 5\' 4"  (1.626 m)  Wt 275 lb (124.739 kg)  BMI 47.18 kg/m2 NAD. Alert, oriented. TMs clear effusion. Pharynx clear. Neck supple with mild anterior adenopathy. Lungs initially inspiratory and expiratory wheezes throughout lung fields. No tachypnea. Occasional non productive cough. Given albuterol 2.5 mg neb treatment. Faint expiratory wheeze still heard with improved air flow and subjective improvement of symptoms. Heart RRR. Has gained weight particularly around abdominal area.  Results for orders placed or performed in visit on 07/21/15  POCT glycosylated hemoglobin (Hb A1C)  Result Value Ref Range   Hemoglobin A1C 6.5      Assessment:  Problem List Items Addressed This Visit      Cardiovascular and Mediastinum   Essential hypertension, benign   Relevant Medications   amLODipine (NORVASC) 5 MG tablet   bumetanide (BUMEX) 1 MG tablet   valsartan (DIOVAN) 320 MG tablet     Other   Anxiety as acute reaction to exceptional stress   Relevant Medications   escitalopram (LEXAPRO) 10 MG tablet   Prediabetes - Primary   Relevant Orders   POCT glycosylated hemoglobin (Hb A1C) (Completed)    Other Visit Diagnoses    Wheezing        Relevant Medications    albuterol (PROVENTIL) (2.5 MG/3ML) 0.083% nebulizer solution 2.5 mg (Completed)    Acute upper respiratory infection        Relevant Medications    VALACYCLOVIR HCL PO    Acute bronchitis,  unspecified organism             Plan:  Meds ordered this encounter  Medications  .       .       . amLODipine (NORVASC) 5 MG tablet    Sig: Take 1 tablet (5 mg total) by mouth daily.    Dispense:  90 tablet    Refill:  1    Order Specific Question:  Supervising Provider    Answer:  Mikey Kirschner [[id number]]  . bumetanide (BUMEX) 1 MG tablet    Sig: Take 1 tablet (1 mg total) by mouth daily.    Dispense:  90 tablet    Refill:  1    Order Specific Question:  Supervising Provider    Answer:  Mikey Kirschner [[id number]]  . escitalopram (LEXAPRO) 10 MG tablet    Sig: Take 1 tablet (10 mg total) by mouth daily.    Dispense:  90 tablet    Refill:  1    Order Specific Question:  Supervising Provider    Answer:  Mikey Kirschner [[id number]]  . valsartan (DIOVAN) 320 MG tablet    Sig: Take 1 tablet (320 mg total) by mouth daily.    Dispense:  90 tablet    Refill:  1    Order Specific Question:  Supervising Provider    Answer:  Mikey Kirschner [[id number]]  . albuterol (PROVENTIL) (2.5 MG/3ML) 0.083% nebulizer solution 2.5  mg    Sig:   . predniSONE (DELTASONE) 20 MG tablet    Sig: 3 po qd x 3 d then 2 po qd x 3 d then 1 po qd x 3 d    Dispense:  18 tablet    Refill:  0    Order Specific Question:  Supervising Provider    Answer:  Mikey Kirschner [[id number]]  . HYDROcodone-homatropine (HYCODAN) 5-1.5 MG/5ML syrup    Sig: Take 5 mLs by mouth every 4 (four) hours as needed.    Dispense:  120 mL    Refill:  0    Order Specific Question:  Supervising Provider    Answer:  Mikey Kirschner [[id number]]  . Naltrexone-Bupropion HCl ER (CONTRAVE) 8-90 MG TB12    Sig: 2 po BID    Dispense:  120 tablet    Refill:  0    Order Specific Question:  Supervising Provider    Answer:  Mikey Kirschner [[id number]]  . Naltrexone-Bupropion HCl ER 8-90 MG TB12    Sig: One po qam x 1 week then one po BID x 1 week then 2 po qam and one po qpm then 2 po BID    Dispense:  90 tablet    Refill:  0    Order Specific  Question:  Supervising Provider    Answer:  Mikey Kirschner [[id number]]   Do not start Contrave until Hycodan is complete. Wishes to try Contrave for weight loss. DC med and call if any problems. Encouraged healthy diet low in sugar and simple carbs and regular activity. Reminded patient about preventive health physical. Call back in 4-5 days if no improvement. Continue albuterol as directed. Otherwise, routine follow up in 4 months.

## 2015-08-01 ENCOUNTER — Encounter: Payer: Self-pay | Admitting: Nurse Practitioner

## 2015-08-02 ENCOUNTER — Ambulatory Visit (INDEPENDENT_AMBULATORY_CARE_PROVIDER_SITE_OTHER): Payer: BLUE CROSS/BLUE SHIELD | Admitting: Nurse Practitioner

## 2015-08-02 ENCOUNTER — Encounter: Payer: Self-pay | Admitting: Nurse Practitioner

## 2015-08-02 VITALS — BP 160/94 | Temp 98.0°F | Ht 64.0 in | Wt 277.2 lb

## 2015-08-02 DIAGNOSIS — R1011 Right upper quadrant pain: Secondary | ICD-10-CM

## 2015-08-02 DIAGNOSIS — R05 Cough: Secondary | ICD-10-CM | POA: Diagnosis not present

## 2015-08-02 DIAGNOSIS — R059 Cough, unspecified: Secondary | ICD-10-CM

## 2015-08-02 NOTE — Progress Notes (Signed)
Subjective:  Presents for c/o pain in the area of the RUQ of the abd. Began about 2 weeks ago. Now a constant dull ache. Occasional hard spasm. Worse with pressure from her breast. Will hold up her breast away from this area which helps. No pain with movement or cough. Unassociated with meals or particular foods. No problem at night, begins as "soon as her feet hit the floor". No fever. No N/V, diarrhea or constipation. No urinary symptoms. Sporadic cough, much improved. Minimal wheezing, has not used inhaler in a few days. No rash. No SOB. Mild mid back pain when her husband rubbed this area.  Objective:   BP 160/94 mmHg  Temp(Src) 98 F (36.7 C) (Oral)  Ht 5\' 4"  (1.626 m)  Wt 277 lb 4 oz (125.76 kg)  BMI 47.57 kg/m2 NAD. Alert, oriented. TMs clear effusion. Pharynx clear. Neck supple with mild anterior adenopathy. Lungs mildly diminished breath sounds but clear. No tachypnea. Occasional non productive cough. Heart RRR. Tenderness to deep palpation along the RUQ of the abdomen. No tenderness with palpation of the right lower ribs. Skin clear. No obvious masses but exam limited to abd girth. No tenderness in the right flank or mid back area.   Assessment: Abdominal pain, right upper quadrant - Plan: CBC with Differential/Platelet, Hepatic function panel, Basic metabolic panel, Lipase, DG Chest 2 View, US Abdomen Complete  Cough - Plan: DG Chest 2 View  Plan: warning signs reviewed. Further follow up based on test results. Call or go to ED sooner if worse.

## 2015-08-03 ENCOUNTER — Telehealth: Payer: Self-pay | Admitting: Nurse Practitioner

## 2015-08-03 ENCOUNTER — Other Ambulatory Visit: Payer: Self-pay | Admitting: *Deleted

## 2015-08-03 LAB — CBC WITH DIFFERENTIAL/PLATELET
BASOS ABS: 0.1 10*3/uL (ref 0.0–0.2)
Basos: 1 %
EOS (ABSOLUTE): 0.2 10*3/uL (ref 0.0–0.4)
Eos: 2 %
Hematocrit: 41.1 % (ref 34.0–46.6)
Hemoglobin: 14.2 g/dL (ref 11.1–15.9)
IMMATURE GRANULOCYTES: 1 %
Immature Grans (Abs): 0.1 10*3/uL (ref 0.0–0.1)
Lymphocytes Absolute: 2.9 10*3/uL (ref 0.7–3.1)
Lymphs: 29 %
MCH: 29.2 pg (ref 26.6–33.0)
MCHC: 34.5 g/dL (ref 31.5–35.7)
MCV: 84 fL (ref 79–97)
MONOS ABS: 0.9 10*3/uL (ref 0.1–0.9)
Monocytes: 9 %
NEUTROS PCT: 58 %
Neutrophils Absolute: 6 10*3/uL (ref 1.4–7.0)
PLATELETS: 371 10*3/uL (ref 150–379)
RBC: 4.87 x10E6/uL (ref 3.77–5.28)
RDW: 15.1 % (ref 12.3–15.4)
WBC: 10.1 10*3/uL (ref 3.4–10.8)

## 2015-08-03 LAB — HEPATIC FUNCTION PANEL
ALBUMIN: 4.3 g/dL (ref 3.5–5.5)
ALT: 25 IU/L (ref 0–32)
AST: 17 IU/L (ref 0–40)
Alkaline Phosphatase: 112 IU/L (ref 39–117)
BILIRUBIN TOTAL: 0.3 mg/dL (ref 0.0–1.2)
Bilirubin, Direct: 0.1 mg/dL (ref 0.00–0.40)
Total Protein: 7.2 g/dL (ref 6.0–8.5)

## 2015-08-03 LAB — BASIC METABOLIC PANEL
BUN/Creatinine Ratio: 15 (ref 9–23)
BUN: 12 mg/dL (ref 6–24)
CALCIUM: 9.8 mg/dL (ref 8.7–10.2)
CHLORIDE: 99 mmol/L (ref 96–106)
CO2: 24 mmol/L (ref 18–29)
Creatinine, Ser: 0.78 mg/dL (ref 0.57–1.00)
GFR, EST AFRICAN AMERICAN: 103 mL/min/{1.73_m2} (ref >59)
GFR, EST NON AFRICAN AMERICAN: 89 mL/min/{1.73_m2} (ref >59)
Glucose: 151 mg/dL — ABNORMAL HIGH (ref 65–99)
Potassium: 4.3 mmol/L (ref 3.5–5.2)
Sodium: 143 mmol/L (ref 134–144)

## 2015-08-03 LAB — LIPASE: Lipase: 63 U/L — ABNORMAL HIGH (ref 0–59)

## 2015-08-03 MED ORDER — CHLORZOXAZONE 500 MG PO TABS: 500.0000 mg | ORAL_TABLET | Freq: Three times a day (TID) | ORAL | Status: DC | PRN

## 2015-08-03 NOTE — Telephone Encounter (Signed)
[facility] to get more info 

## 2015-08-03 NOTE — Telephone Encounter (Signed)
Patient called and says she thought she was going to have a muscle relaxer called in after she saw Hoyle Sauer yesterday, but it doesn't look like anything was ever called in.  Please advise.   [facility]

## 2015-08-03 NOTE — Telephone Encounter (Signed)
Seen yesterday by carolyn for ruq pain. Was suppose to get something for muscle spasm in her abdomen. Hard for her to sit

## 2015-08-03 NOTE — Telephone Encounter (Signed)
Parafon forte 500 one tid prn #30 caution drowiness per dr Nicki Reaper. Med sent to pharm. Pt notified on vm.

## 2015-08-05 ENCOUNTER — Other Ambulatory Visit: Payer: Self-pay | Admitting: *Deleted

## 2015-08-05 ENCOUNTER — Ambulatory Visit (HOSPITAL_COMMUNITY)
Admission: RE | Admit: 2015-08-05 | Discharge: 2015-08-05 | Disposition: A | Discharge status: Home / Self Care | Payer: BLUE CROSS/BLUE SHIELD | Source: Ambulatory Visit | Attending: Nurse Practitioner | Admitting: Nurse Practitioner

## 2015-08-05 ENCOUNTER — Telehealth: Payer: Self-pay | Admitting: Family Medicine

## 2015-08-05 DIAGNOSIS — R079 Chest pain, unspecified: Secondary | ICD-10-CM | POA: Diagnosis not present

## 2015-08-05 DIAGNOSIS — R05 Cough: Secondary | ICD-10-CM | POA: Insufficient documentation

## 2015-08-05 DIAGNOSIS — R918 Other nonspecific abnormal finding of lung field: Secondary | ICD-10-CM | POA: Diagnosis not present

## 2015-08-05 DIAGNOSIS — K760 Fatty (change of) liver, not elsewhere classified: Secondary | ICD-10-CM | POA: Insufficient documentation

## 2015-08-05 DIAGNOSIS — I517 Cardiomegaly: Secondary | ICD-10-CM | POA: Diagnosis not present

## 2015-08-05 DIAGNOSIS — R1011 Right upper quadrant pain: Secondary | ICD-10-CM | POA: Diagnosis not present

## 2015-08-05 MED ORDER — AZITHROMYCIN 250 MG PO TABS: ORAL_TABLET | ORAL | Status: DC

## 2015-08-05 MED ORDER — FLUCONAZOLE 150 MG PO TABS: ORAL_TABLET | ORAL | Status: DC

## 2015-08-05 NOTE — Telephone Encounter (Signed)
u s gb finne, ch xray shows a little bronch irritation but no acute pneum etc. Does she smoke? Given anything for cough? Coughing up any stuff?  Take otc pain med with the chlorzoxazone?

## 2015-08-05 NOTE — Telephone Encounter (Signed)
Pt called to get the results of her xray and ultrasound that she had done today. Pt is going out of town tomorrow and wants to make sure that she is ok to go. Pt is aware that Hoyle Sauer isn't here today, but is wanting to know if Dr. Richardson Landry can look at them and at least let her know if she is ok to go out of town without anything erupting. Please advise.

## 2015-08-05 NOTE — Telephone Encounter (Signed)
[facility] 08/05/15

## 2015-08-05 NOTE — Telephone Encounter (Signed)
Discussed with pt. She does not smoke, coughing up yellow mucus. Taking hycodan for cough, not taking anything for pain. Takes chlorzoxazone but not helping. Pain is worse. Pain under right rib. Pain started shooting down into abdomen last night and today. No fever.

## 2015-08-05 NOTE — Telephone Encounter (Signed)
[facility]

## 2015-08-05 NOTE — Telephone Encounter (Signed)
Discussed with pt. zpack sent to pharm.

## 2015-08-05 NOTE — Telephone Encounter (Signed)
Add z pk, ibuprofen 600 tid otc with the chlorzoxazone, likely chest wall pain sec to all the coughing

## 2015-08-08 ENCOUNTER — Other Ambulatory Visit: Payer: Self-pay

## 2015-08-08 DIAGNOSIS — R748 Abnormal levels of other serum enzymes: Secondary | ICD-10-CM

## 2015-08-10 ENCOUNTER — Encounter: Payer: Self-pay | Admitting: Nurse Practitioner

## 2015-08-10 ENCOUNTER — Other Ambulatory Visit: Payer: Self-pay | Admitting: Nurse Practitioner

## 2015-08-10 LAB — LIPASE: Lipase: 41 U/L (ref 0–59)

## 2015-08-10 MED ORDER — GABAPENTIN 300 MG PO CAPS: 300.0000 mg | ORAL_CAPSULE | Freq: Two times a day (BID) | ORAL | Status: DC

## 2015-09-14 ENCOUNTER — Telehealth: Payer: Self-pay | Admitting: Family Medicine

## 2015-09-14 NOTE — Telephone Encounter (Signed)
bw results notes from [facility] in phone message as per your request  Sent folder back to your office

## 2015-09-20 NOTE — Telephone Encounter (Signed)
Dr steves 

## 2015-09-29 ENCOUNTER — Other Ambulatory Visit: Payer: Self-pay | Admitting: Family Medicine

## 2015-09-30 MED ORDER — FLUCONAZOLE 150 MG PO TABS: ORAL_TABLET | ORAL | Status: DC

## 2015-09-30 NOTE — Addendum Note (Signed)
Addended by: Dairl Ponder on: 09/30/2015 08:49 AM   Modules accepted: Orders

## 2015-10-18 ENCOUNTER — Telehealth: Payer: Self-pay | Admitting: *Deleted

## 2015-10-18 NOTE — Telephone Encounter (Signed)
Ok thanks 

## 2015-10-18 NOTE — Telephone Encounter (Signed)
Patient states blood work was drawn by her employer([employer]). Shaune Pollack FNP met with patient and discussed results. Patient stated she was started on Vit D. Patient states she was on prednisone for complications from flu at time of blood work and she was advised by FNP that they would watch her glucose it for now-watch diet and she has an appt to follow up with the nurse practitioner on April 27th to have glucose and HgbA1c retested and go from there.

## 2015-11-27 ENCOUNTER — Other Ambulatory Visit: Payer: Self-pay | Admitting: Nurse Practitioner

## 2015-12-20 DIAGNOSIS — Z1231 Encounter for screening mammogram for malignant neoplasm of breast: Secondary | ICD-10-CM | POA: Diagnosis not present

## 2015-12-23 DIAGNOSIS — Z79899 Other long term (current) drug therapy: Secondary | ICD-10-CM | POA: Diagnosis not present

## 2015-12-23 DIAGNOSIS — E559 Vitamin D deficiency, unspecified: Secondary | ICD-10-CM | POA: Diagnosis not present

## 2015-12-23 DIAGNOSIS — E119 Type 2 diabetes mellitus without complications: Secondary | ICD-10-CM | POA: Diagnosis not present

## 2015-12-23 DIAGNOSIS — Z139 Encounter for screening, unspecified: Secondary | ICD-10-CM | POA: Diagnosis not present

## 2015-12-23 DIAGNOSIS — E782 Mixed hyperlipidemia: Secondary | ICD-10-CM | POA: Diagnosis not present

## 2015-12-23 DIAGNOSIS — M109 Gout, unspecified: Secondary | ICD-10-CM | POA: Diagnosis not present

## 2015-12-27 DIAGNOSIS — E559 Vitamin D deficiency, unspecified: Secondary | ICD-10-CM | POA: Diagnosis not present

## 2015-12-27 DIAGNOSIS — E119 Type 2 diabetes mellitus without complications: Secondary | ICD-10-CM | POA: Diagnosis not present

## 2015-12-27 DIAGNOSIS — E782 Mixed hyperlipidemia: Secondary | ICD-10-CM | POA: Diagnosis not present

## 2015-12-27 DIAGNOSIS — M109 Gout, unspecified: Secondary | ICD-10-CM | POA: Diagnosis not present

## 2015-12-28 ENCOUNTER — Encounter: Payer: Self-pay | Admitting: Nurse Practitioner

## 2016-01-03 ENCOUNTER — Other Ambulatory Visit: Payer: Self-pay

## 2016-01-03 MED ORDER — VALSARTAN 320 MG PO TABS: ORAL_TABLET | ORAL | Status: DC

## 2016-01-03 MED ORDER — AMLODIPINE BESYLATE 5 MG PO TABS: ORAL_TABLET | ORAL | Status: DC

## 2016-01-03 MED ORDER — METFORMIN HCL 500 MG PO TABS: ORAL_TABLET | ORAL | Status: DC

## 2016-01-06 ENCOUNTER — Ambulatory Visit: Payer: BLUE CROSS/BLUE SHIELD | Admitting: Nurse Practitioner

## 2016-01-13 ENCOUNTER — Ambulatory Visit: Payer: BLUE CROSS/BLUE SHIELD | Admitting: Nurse Practitioner

## 2016-01-18 ENCOUNTER — Encounter: Payer: Self-pay | Admitting: Family Medicine

## 2016-01-26 ENCOUNTER — Ambulatory Visit (INDEPENDENT_AMBULATORY_CARE_PROVIDER_SITE_OTHER): Payer: BLUE CROSS/BLUE SHIELD | Admitting: Nurse Practitioner

## 2016-01-26 ENCOUNTER — Encounter: Payer: Self-pay | Admitting: Nurse Practitioner

## 2016-01-26 VITALS — BP 132/88 | Ht 64.0 in | Wt 280.4 lb

## 2016-01-26 DIAGNOSIS — E119 Type 2 diabetes mellitus without complications: Secondary | ICD-10-CM | POA: Diagnosis not present

## 2016-01-26 DIAGNOSIS — I10 Essential (primary) hypertension: Secondary | ICD-10-CM

## 2016-01-26 MED ORDER — PHENTERMINE HCL 37.5 MG PO TABS: 37.5000 mg | ORAL_TABLET | Freq: Every day | ORAL | Status: DC

## 2016-01-26 NOTE — Patient Instructions (Signed)
Bydureon Trulicity Tanzeum Which one is preferred with your insurance?

## 2016-01-27 ENCOUNTER — Encounter: Payer: Self-pay | Admitting: Nurse Practitioner

## 2016-01-27 DIAGNOSIS — E119 Type 2 diabetes mellitus without complications: Secondary | ICD-10-CM | POA: Insufficient documentation

## 2016-01-27 DIAGNOSIS — E1169 Type 2 diabetes mellitus with other specified complication: Secondary | ICD-10-CM | POA: Insufficient documentation

## 2016-01-27 NOTE — Progress Notes (Signed)
Subjective:  Presents for routine follow-up on her diabetes and hypertension. Limited activity lately due to a foot problem and then hemorrhoid surgery in May. Has been steadily gaining weight. Has not done well with her diet. Plans to start back kickboxing next week. No chest pain/ischemic type pain or shortness of breath. Occasional mild edema. Taking metformin daily. Has taken Byetta without difficulty in the past, this helped her sugars as well as her weight loss. Her last available lab work was done through work in February see scanned copy. States she has had some labs done since then but does not have a copy available today.  Objective:   BP 132/88 mmHg  Ht 5\' 4"  (1.626 m)  Wt 280 lb 6.4 oz (127.189 kg)  BMI 48.11 kg/m2 NAD. Alert, oriented. Lungs clear. Heart regular rate rhythm. 1+ pitting edema lower extremities. Diabetic Foot Exam - Simple   Simple Foot Form  Diabetic Foot exam was performed with the following findings:  Yes 01/26/2016  4:30 PM  Visual Inspection  No deformities, no ulcerations, no other skin breakdown bilaterally:  Yes  Sensation Testing  Intact to touch and monofilament testing bilaterally:  Yes  Pulse Check  See comments:  Yes  Comments  DP pulses present bilaterally. Toes warm with normal capillary refill.     A1c was 7.3 in February.  Assessment:  Problem List Items Addressed This Visit      Cardiovascular and Mediastinum   Essential hypertension, benign - Primary     Endocrine   Type 2 diabetes mellitus without complication, without long-term current use of insulin ([place])     Other   Morbid obesity due to excess calories ([facility])   Relevant Medications   phentermine (ADIPEX-P) 37.5 MG tablet     Plan:  Meds ordered this encounter  Medications  . phentermine (ADIPEX-P) 37.5 MG tablet    Sig: Take 1 tablet (37.5 mg total) by mouth daily before breakfast.    Dispense:  30 tablet    Refill:  0    Order Specific Question:  Supervising Provider   Answer:  Mikey Kirschner [[id number]]   Encouraged slowly increased activity and healthy low-carb/low sugar diet. Start phentermine as directed. Reviewed potential adverse effects. DC med and call if any problems. Also recommend starting medication similar to Byetta depending on what her insurance will cover. Patient to check into this and get back with [name]. Otherwise recheck in one month.

## 2016-02-27 ENCOUNTER — Ambulatory Visit (INDEPENDENT_AMBULATORY_CARE_PROVIDER_SITE_OTHER): Payer: BLUE CROSS/BLUE SHIELD | Admitting: Nurse Practitioner

## 2016-02-27 ENCOUNTER — Encounter: Payer: Self-pay | Admitting: Nurse Practitioner

## 2016-02-27 VITALS — BP 148/96 | Ht 64.0 in | Wt 269.0 lb

## 2016-02-27 DIAGNOSIS — I10 Essential (primary) hypertension: Secondary | ICD-10-CM | POA: Diagnosis not present

## 2016-02-27 MED ORDER — PHENTERMINE HCL 37.5 MG PO TABS
37.5000 mg | ORAL_TABLET | Freq: Every day | ORAL | 2 refills | Status: DC
Start: 2016-02-27 — End: 2016-09-14

## 2016-02-27 MED ORDER — VERAPAMIL HCL ER 120 MG PO TBCR: 120.0000 mg | EXTENDED_RELEASE_TABLET | Freq: Every day | ORAL | 0 refills | Status: DC

## 2016-02-29 ENCOUNTER — Encounter: Payer: Self-pay | Admitting: Nurse Practitioner

## 2016-02-29 NOTE — Progress Notes (Addendum)
Subjective:  Presents for recheck. Doing well with weight loss. No side effects on Phentermine. Compliant with BP meds. No CP/ischemic type pain or SOB. Bumex working well overall for edema. Has tried to increase Amlodipine in the past for BP but was unable to take new dose due to increased edema. Labs done through work 12/23/15. Copy reviewed with patient today. NP at her job working with her on labs.   Objective:   BP (!) 148/96   Ht 5\' 4"  (1.626 m)   Wt 269 lb (122 kg)   BMI 46.17 kg/m  NAD. Alert, oriented. Lungs clear. Heart RRR. BP on recheck 158/106 right arm sitting. The nurse got similar repeat reading. Lower extremities no edema. Carotids: no bruits or thrills.   Assessment:  Problem List Items Addressed This Visit      Cardiovascular and Mediastinum   Essential hypertension, benign - Primary   Relevant Medications   verapamil (CALAN-SR) 120 MG CR tablet     Other   Morbid obesity due to excess calories ([facility])   Relevant Medications   phentermine (ADIPEX-P) 37.5 MG tablet    Other Visit Diagnoses   None.    Plan:  Meds ordered this encounter  Medications  . phentermine (ADIPEX-P) 37.5 MG tablet    Sig: Take 1 tablet (37.5 mg total) by mouth daily before breakfast.    Dispense:  30 tablet    Refill:  2    Order Specific Question:   Supervising Provider    Answer:   Mikey Kirschner [[id number]]  . verapamil (CALAN-SR) 120 MG CR tablet    Sig: Take 1 tablet (120 mg total) by mouth daily.    Dispense:  30 tablet    Refill:  0    Order Specific Question:   Supervising Provider    Answer:   Maggie Font   Nurse visit in 1-2 weeks on new med. If BP is around 140/90, she will be given Rx for Phentermine at that time. If not, adjustments to Verapamil dose.  Discussed importance of stress reduction and continued weight loss.  Return in about 3 months (around 05/29/2016) for recheck.

## 2016-03-02 ENCOUNTER — Ambulatory Visit: Payer: BLUE CROSS/BLUE SHIELD

## 2016-03-02 VITALS — BP 152/98

## 2016-03-02 DIAGNOSIS — Z0130 Encounter for examination of blood pressure without abnormal findings: Secondary | ICD-10-CM

## 2016-03-02 NOTE — Progress Notes (Signed)
Patient in today for for blood pressure check. Patient blood pressure upon arrival is 166/100, after 10 minutes of rest patient blood pressure was 152/98. Reviewed with Linzie Collin and was told to have the patient send [name] blood pressure readings during the week. Patient verbalized understanding.

## 2016-03-21 ENCOUNTER — Other Ambulatory Visit: Payer: Self-pay | Admitting: Family Medicine

## 2016-03-27 DIAGNOSIS — E559 Vitamin D deficiency, unspecified: Secondary | ICD-10-CM | POA: Diagnosis not present

## 2016-03-27 DIAGNOSIS — E782 Mixed hyperlipidemia: Secondary | ICD-10-CM | POA: Diagnosis not present

## 2016-03-27 DIAGNOSIS — M109 Gout, unspecified: Secondary | ICD-10-CM | POA: Diagnosis not present

## 2016-03-27 DIAGNOSIS — E119 Type 2 diabetes mellitus without complications: Secondary | ICD-10-CM | POA: Diagnosis not present

## 2016-04-09 ENCOUNTER — Telehealth: Payer: Self-pay | Admitting: *Deleted

## 2016-04-09 NOTE — Telephone Encounter (Signed)
rx being held at nurse's station for pt since aug 21st for phentermine with a note to hold at nurse's station. Did you want [name] to give to pt.

## 2016-04-11 NOTE — Telephone Encounter (Signed)
Nurses, what is Tammatha's latest BP check up readings?

## 2016-04-11 NOTE — Telephone Encounter (Signed)
Left message to return call 

## 2016-04-11 NOTE — Telephone Encounter (Signed)
152/98 on 03/02/16

## 2016-04-11 NOTE — Telephone Encounter (Signed)
She will need another BP check or readings from work before we given Rx since it has consistently been elevated here.

## 2016-04-16 NOTE — Telephone Encounter (Signed)
Patient states that her blood pressures have been running too high at work to send them to [place] her workplace is very stressful and it will always be high there. Patient states she will have the nurse check it again on Tuesday and Thursday when she is there and will send the reading in on Friday.

## 2016-04-17 NOTE — Telephone Encounter (Signed)
noted 

## 2016-04-18 DIAGNOSIS — Z23 Encounter for immunization: Secondary | ICD-10-CM | POA: Diagnosis not present

## 2016-04-22 ENCOUNTER — Emergency Department (HOSPITAL_COMMUNITY): Payer: BLUE CROSS/BLUE SHIELD

## 2016-04-22 ENCOUNTER — Encounter (HOSPITAL_COMMUNITY): Payer: Self-pay | Admitting: *Deleted

## 2016-04-22 ENCOUNTER — Emergency Department (HOSPITAL_COMMUNITY)
Admission: EM | Admit: 2016-04-22 | Discharge: 2016-04-22 | Disposition: A | Discharge status: Home / Self Care | Payer: BLUE CROSS/BLUE SHIELD | Attending: Emergency Medicine | Admitting: Emergency Medicine

## 2016-04-22 DIAGNOSIS — Z87891 Personal history of nicotine dependence: Secondary | ICD-10-CM | POA: Insufficient documentation

## 2016-04-22 DIAGNOSIS — Z79899 Other long term (current) drug therapy: Secondary | ICD-10-CM | POA: Diagnosis not present

## 2016-04-22 DIAGNOSIS — K1120 Sialoadenitis, unspecified: Secondary | ICD-10-CM | POA: Diagnosis not present

## 2016-04-22 DIAGNOSIS — R600 Localized edema: Secondary | ICD-10-CM | POA: Diagnosis present

## 2016-04-22 DIAGNOSIS — N390 Urinary tract infection, site not specified: Secondary | ICD-10-CM | POA: Diagnosis not present

## 2016-04-22 DIAGNOSIS — Z7984 Long term (current) use of oral hypoglycemic drugs: Secondary | ICD-10-CM | POA: Diagnosis not present

## 2016-04-22 DIAGNOSIS — E119 Type 2 diabetes mellitus without complications: Secondary | ICD-10-CM | POA: Diagnosis not present

## 2016-04-22 DIAGNOSIS — R220 Localized swelling, mass and lump, head: Secondary | ICD-10-CM | POA: Diagnosis not present

## 2016-04-22 LAB — URINALYSIS, ROUTINE W REFLEX MICROSCOPIC
BILIRUBIN URINE: NEGATIVE
GLUCOSE, UA: NEGATIVE mg/dL
KETONES UR: NEGATIVE mg/dL
Nitrite: POSITIVE — AB
PH: 6 (ref 5.0–8.0)
Protein, ur: 30 mg/dL — AB
Specific Gravity, Urine: >1.03 — ABNORMAL HIGH (ref 1.005–1.030)

## 2016-04-22 LAB — URINE MICROSCOPIC-ADD ON

## 2016-04-22 MED ORDER — AMOXICILLIN-POT CLAVULANATE 875-125 MG PO TABS: 1.0000 | ORAL_TABLET | Freq: Two times a day (BID) | ORAL | 0 refills | Status: DC

## 2016-04-22 MED ORDER — FLUCONAZOLE 150 MG PO TABS: 150.0000 mg | ORAL_TABLET | Freq: Once | ORAL | 0 refills | Status: AC

## 2016-04-22 MED ORDER — OXYCODONE-ACETAMINOPHEN 5-325 MG PO TABS
1.0000 | ORAL_TABLET | Freq: Once | ORAL | Status: AC
  Administered 2016-04-22: 1 via ORAL
  Filled 2016-04-22: qty 1

## 2016-04-22 NOTE — ED Notes (Signed)
Gave pt ice pk for face

## 2016-04-22 NOTE — ED Provider Notes (Signed)
[facility] DEPT Provider Note   CSN: [id number] Arrival date & time: 04/22/16  1604     History   Chief Complaint Chief Complaint  Patient presents with  . Facial Swelling    HPI Joanna Reid is a 50 y.o. female.  HPI  Pt was seen at 1655. Per pt, c/o gradual onset and persistence of constant left sided face/jaw "pain" that began this morning. Pt states the pain is located in front of her left ear, worsens with opening her mouth and palpation of the area. Pt states she took ibuprofen with improvement of her symptoms. Denies injury, no intra-oral pain or swelling, no sore throat. Pt also c/o Per pt, c/o gradual onset and persistence of constant dysuria, urinary frequency and urgency for the past several days.  Denies flank pain, no fevers, no abd pain, no N/V/D, no vaginal bleeding/discharge, no rash.   Past Medical History:  Diagnosis Date  . Acid reflux   . Allergic rhinitis   . Diabetes mellitus    type 2  . Gout   . Gout   . HBP (high blood pressure)   . Migraines     Patient Active Problem List   Diagnosis Date Noted  . Type 2 diabetes mellitus without complication, without long-term current use of insulin ([place]) 01/27/2016  . Morbid obesity due to excess calories ([place]) 01/27/2016  . Anxiety as acute reaction to exceptional stress 01/13/2015  . Attention deficit hyperactivity disorder (ADHD), 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  . Gout 01/20/2014  . GERD (gastroesophageal reflux disease) 01/20/2014  . Essential hypertension, benign 01/20/2014  . Plantar fasciitis of left foot 09/19/2011    Past Surgical History:  Procedure Laterality Date  . CESAREAN SECTION    . EYE SURGERY    . HEMORRHOID SURGERY N/A 11/19/2012   Procedure: HEMORRHOIDECTOMY;  Surgeon: Jamesetta So, MD;  Location: [facility];  Service: General;  Laterality: N/A;  . kidney stones  1998  . LAPAROSCOPIC UNILATERAL SALPINGO OOPHERECTOMY  05/14/2012   Procedure: LAPAROSCOPIC UNILATERAL SALPINGO  OOPHORECTOMY;  Surgeon: [name], MD;  Location: [facility];  Service: Gynecology;  Laterality: Right;  laparoscopic right salpingo-oophorectomy  . PARTIAL HYSTERECTOMY    . TONSILECTOMY, ADENOIDECTOMY, BILATERAL MYRINGOTOMY AND TUBES    . VESICOVAGINAL FISTULA CLOSURE W/ TAH        Home Medications    Prior to Admission medications   Medication Sig Start Date End Date Taking? Authorizing Provider  albuterol (PROVENTIL HFA;VENTOLIN HFA) 108 (90 BASE) MCG/ACT inhaler Inhale 1-2 puffs into the lungs as needed for wheezing or shortness of breath.    Historical Provider, MD  allopurinol (ZYLOPRIM) 300 MG tablet TAKE 1 TABLET DAILY FOR GOUT 06/29/15   Nilda Simmer, NP  bumetanide (BUMEX) 1 MG tablet Take 1 tablet (1 mg total) by mouth daily. 07/21/15   Nilda Simmer, NP  docusate sodium (COLACE) 100 MG capsule Take 100 mg by mouth daily as needed for mild constipation.    Historical Provider, MD  fluticasone (FLONASE) 50 MCG/ACT nasal spray Place 2 sprays into the nose 2 (two) times daily.    Historical Provider, MD  metFORMIN (GLUCOPHAGE) 500 MG tablet Take 1 tablet by mouth once daily with breakfast 03/21/16   Mikey Kirschner, MD  omeprazole (PRILOSEC) 20 MG capsule TAKE 1 CAPSULE DAILY 06/25/14   Nilda Simmer, NP  phentermine (ADIPEX-P) 37.5 MG tablet Take 1 tablet (37.5 mg total) by mouth daily before breakfast. 02/27/16   Nilda Simmer,  NP  VALACYCLOVIR HCL PO Take by mouth.    Historical Provider, MD  valsartan (DIOVAN) 320 MG tablet Take 1 tablet by mouth  daily 01/03/16   Mikey Kirschner, MD  verapamil (CALAN-SR) 120 MG CR tablet Take 1 tablet (120 mg total) by mouth daily. 02/27/16   Nilda Simmer, NP    Family History Family History  Problem Relation Age of Onset  . Arthritis    . Cancer    . Diabetes    . Hypertension Mother   . Diabetes Father   . Diabetes Brother   . Hypertension Brother     Social History Social History  Substance Use Topics  .  Smoking status: Former Smoker    Quit date: 02/27/1999  . Smokeless tobacco: Never Used     Comment: socially   . Alcohol use No     Allergies   Review of patient's allergies indicates no known allergies.   Review of Systems Review of Systems ROS: Statement: All systems negative except as marked or noted in the HPI; Constitutional: Negative for fever and chills. ; ; Eyes: Negative for eye pain, redness and discharge. ; ; ENMT: +left face/jaw area pain. Negative for ear pain, hoarseness, nasal congestion, sinus pressure and sore throat. ; ; Cardiovascular: Negative for chest pain, palpitations, diaphoresis, dyspnea and peripheral edema. ; ; Respiratory: Negative for cough, wheezing and stridor. ; ; Gastrointestinal: Negative for nausea, vomiting, diarrhea, abdominal pain, blood in stool, hematemesis, jaundice and rectal bleeding. . ; ; Genitourinary: +dysuria. Negative for flank pain and hematuria. ; ; Musculoskeletal: Negative for back pain and neck pain. Negative for swelling and trauma.; ; Skin: Negative for pruritus, rash, abrasions, blisters, bruising and skin lesion.; ; Neuro: Negative for headache, lightheadedness and neck stiffness. Negative for weakness, altered level of consciousness, altered mental status, extremity weakness, paresthesias, involuntary movement, seizure and syncope.       Physical Exam Updated Vital Signs BP (!) 188/110 (BP Location: Right Arm)   Pulse 83   Temp 98.1 F (36.7 C) (Oral)   Resp 18   Ht 5\' 4"  (1.626 m)   Wt 269 lb (122 kg)   SpO2 97%   BMI 46.17 kg/m   Physical Exam 1700: Physical examination:  Nursing notes reviewed; Vital signs and O2 SAT reviewed;  Constitutional: Well developed, Well nourished, Well hydrated, In no acute distress; Head:  Normocephalic, atraumatic; Eyes: EOMI, PERRL, No scleral icterus; ENMT: TM's clear bilat. +edemetous nasal turbinates bilat with clear rhinorrhea. Mouth and pharynx without lesions. No tonsillar exudates.  No intra-oral edema. No submandibular or sublingual edema. No hoarse voice, no drooling, no stridor. No pain with manipulation of larynx. No trismus. Mouth and pharynx normal, Mucous membranes moist. Edentulous left lower mandible. No gingival edema. +TTP left TMJ and masseter muscle, no overlying rash or edema. ; Neck: Supple, Full range of motion, No lymphadenopathy; Cardiovascular: Regular rate and rhythm, No gallop; Respiratory: Breath sounds clear & equal bilaterally, No wheezes.  Speaking full sentences with ease, Normal respiratory effort/excursion; Chest: Nontender, Movement normal; Abdomen: Soft, Nontender, Nondistended, Normal bowel sounds; Genitourinary: No CVA tenderness; Extremities: Pulses normal, No tenderness, No edema, No calf edema or asymmetry.; Neuro: AA&Ox3, Major CN grossly intact.  Speech clear. No gross focal motor or sensory deficits in extremities. Climbs on and off stretcher easily by herself. Gait steady.; Skin: Color normal, Warm, Dry.    ED Treatments / Results  Labs (all labs ordered are listed, but only abnormal results are  displayed)   EKG  EKG Interpretation None       Radiology   Procedures Procedures (including critical care time)  Medications Ordered in ED Medications  oxyCODONE-acetaminophen (PERCOCET/ROXICET) 5-325 MG per tablet 1 tablet (1 tablet Oral Given 04/22/16 1716)     Initial Impression / Assessment and Plan / ED Course  I have reviewed the triage vital signs and the nursing notes.  Pertinent labs & imaging results that were available during my care of the patient were reviewed by me and considered in my medical decision making (see chart for details).  MDM Reviewed: previous chart, nursing note and vitals Interpretation: labs and CT scan   Results for orders placed or performed during the hospital encounter of 04/22/16  Urinalysis, Routine w reflex microscopic  Result Value Ref Range   Color, Urine YELLOW YELLOW   APPearance  CLEAR CLEAR   Specific Gravity, Urine >1.030 (H) 1.005 - 1.030   pH 6.0 5.0 - 8.0   Glucose, UA NEGATIVE NEGATIVE mg/dL   Hgb urine dipstick MODERATE (A) NEGATIVE   Bilirubin Urine NEGATIVE NEGATIVE   Ketones, ur NEGATIVE NEGATIVE mg/dL   Protein, ur 30 (A) NEGATIVE mg/dL   Nitrite POSITIVE (A) NEGATIVE   Leukocytes, UA TRACE (A) NEGATIVE  Urine microscopic-add on  Result Value Ref Range   Squamous Epithelial / LPF 0-5 (A) NONE SEEN   WBC, UA TOO NUMEROUS TO COUNT 0 - 5 WBC/hpf   RBC / HPF 6-30 0 - 5 RBC/hpf   Bacteria, UA MANY (A) NONE SEEN   Ct Maxillofacial Wo Cm Result Date: 04/22/2016 CLINICAL DATA:  Initial evaluation for acute left-sided facial swelling, left TMJ area. EXAM: CT MAXILLOFACIAL WITHOUT CONTRAST TECHNIQUE: Multidetector CT imaging of the maxillofacial structures was performed. Multiplanar CT image reconstructions were also generated. A small metallic BB was placed on the right temple in order to reliably differentiate right from left. COMPARISON:  None. FINDINGS: Osseous: No acute osseous abnormality within the face. Orbits: Globes and orbits within normal limits. Sinuses: Paranasal sinuses are clear.  No mastoid effusion. Soft tissues: Asymmetric soft tissue swelling within the left face, primarily involving the left parotid space. Subtle increased density within the left parotid gland as compared to the right with associated swelling. Left platysmas is thickened and irregular. Findings suggestive of acute parotitis. No obstructive stones identified. Limited intracranial: Negative. IMPRESSION: Findings consistent with acute left parotitis. No obstructive stones identified. Electronically Signed   By: Jeannine Boga M.D.   On: 04/22/2016 18:19    1845:  CT scan as above. No purulence able to be expressed from parotid duct intra-orally. Will tx with augmentin, f/u PMD and ENT. Dx and testing d/w pt and family.  Questions answered.  Verb understanding, agreeable to d/c  home with outpt f/u.     Final Clinical Impressions(s) / ED Diagnoses   Final diagnoses:  None    New Prescriptions New Prescriptions   No medications on file     Francine Graven, DO 04/24/16 2002

## 2016-04-22 NOTE — ED Triage Notes (Signed)
Pt comes in with left sided facial swelling. Pt woke up with this and it has progressively gotten worse throughout the day. Pt took ibuprofen with decreased her pain and swelling. Denies any airway swelling. Patients pain extends into her ear.

## 2016-04-22 NOTE — ED Triage Notes (Signed)
In addition to this, pt has had back pain and urinary frequency.

## 2016-04-22 NOTE — Discharge Instructions (Signed)
Take the prescriptions as directed. Promote saliva flow by sucking on hard candies, such as lemon drops.  Call your regular medical doctor and the ENT doctor tomorrow to schedule a follow up appointment within the next 2 days.  Return to the Emergency Department immediately sooner if worsening.

## 2016-04-24 ENCOUNTER — Telehealth: Payer: Self-pay | Admitting: Family Medicine

## 2016-04-24 ENCOUNTER — Encounter: Payer: Self-pay | Admitting: Family Medicine

## 2016-04-24 ENCOUNTER — Ambulatory Visit (INDEPENDENT_AMBULATORY_CARE_PROVIDER_SITE_OTHER): Payer: BLUE CROSS/BLUE SHIELD | Admitting: Family Medicine

## 2016-04-24 VITALS — BP 154/98 | Temp 98.4°F | Ht 64.5 in | Wt 270.0 lb

## 2016-04-24 DIAGNOSIS — K1121 Acute sialoadenitis: Secondary | ICD-10-CM

## 2016-04-24 DIAGNOSIS — I10 Essential (primary) hypertension: Secondary | ICD-10-CM | POA: Diagnosis not present

## 2016-04-24 MED ORDER — VERAPAMIL HCL ER 240 MG PO TBCR: 240.0000 mg | EXTENDED_RELEASE_TABLET | Freq: Every day | ORAL | 5 refills | Status: DC

## 2016-04-24 NOTE — Telephone Encounter (Signed)
Pt called to make sure that a referral for the ENT in [place] was put in. Pt states that Dr. Richardson Landry stated this morning that he was going to send her to a specialist. I looked in her chart and didn't notice one. Please advise.

## 2016-04-24 NOTE — Telephone Encounter (Signed)
Pt just seen today and dr Richardson Landry usually puts in when he is finished seeing pts.

## 2016-04-24 NOTE — Telephone Encounter (Signed)
I saw pt a few hrs ago, I worked thru lunch on interview and other issues, i'll put it in tonight

## 2016-04-24 NOTE — Progress Notes (Signed)
   Subjective:    Patient ID: Joanna Reid, female    DOB: 11-16-1965, 50 y.o.   MRN: [id number]  HPI Follow up from ED visit for parotitis. Still some swelling on left side of face. Eating soup and yogurt. Hard to chew. Taking Augmentin. Had CT scan done in hospital.   Full emergency room report and scan reviewed in presence of patient.  Patient has never experienced anything like this before.  Overall symptoms are better but still experiencing swelling and pain on this side. Worried about it.  Pt states verapamil was added at last visit and it is not helping bp. 160 syst over 95 Fa had high blood pressure, asos with alcohol use, Patient is taking all of her blood pressure medication in the morning.  Mo had elev b p   Review of Systems No headache, no major weight loss or weight gain, no chest pain no back pain abdominal pain no change in bowel habits complete ROS otherwise negative     Objective:   Physical Exam  Alert vitals stable, NAD. Blood pressure Not good on repeat., Still 146/92 on repeat both arms. HEENT left parotid region somewhat tender somewhat swollen. Otherwise normal. Lungs clear. Heart regular rate and rhythm.       Assessment & Plan:  Impression acute parotitis discussed at length. #2 hypertension suboptimal control discussed at length plan switch verapamil to daily at bedtime. 2240 daily at bedtime maintain other medications. Follow-up in several months. Continue antibiotics. ENT referral. 25 minutes spent most in discussion

## 2016-04-24 NOTE — Telephone Encounter (Signed)
Pt notiifed on voicemail

## 2016-04-25 LAB — URINE CULTURE

## 2016-04-26 ENCOUNTER — Telehealth (HOSPITAL_BASED_OUTPATIENT_CLINIC_OR_DEPARTMENT_OTHER): Payer: Self-pay | Admitting: Emergency Medicine

## 2016-04-26 NOTE — Telephone Encounter (Signed)
Post ED Visit - Positive Culture Follow-up  Culture report reviewed by antimicrobial stewardship pharmacist:  []  Elenor Quinones, Pharm.D. []  [name], Pharm.D., BCPS []  Parks Neptune, Pharm.D. []  Alycia Rossetti, Pharm.D., BCPS []  [place], Pharm.D., BCPS, AAHIVP []  Legrand Como, Pharm.D., BCPS, AAHIVP []  Milus Glazier, Pharm.D. []  Stephens November, Pharm.D. Gwenlyn Perking PharmD  Positive urine culture Treated with augmentin, fluconazole, organism sensitive to the same and no further patient follow-up is required at this time.  Hazle Nordmann 04/26/2016, 10:40 AM

## 2016-05-05 ENCOUNTER — Other Ambulatory Visit: Payer: Self-pay | Admitting: Family Medicine

## 2016-05-08 DIAGNOSIS — E119 Type 2 diabetes mellitus without complications: Secondary | ICD-10-CM | POA: Diagnosis not present

## 2016-05-08 DIAGNOSIS — E782 Mixed hyperlipidemia: Secondary | ICD-10-CM | POA: Diagnosis not present

## 2016-05-08 DIAGNOSIS — M109 Gout, unspecified: Secondary | ICD-10-CM | POA: Diagnosis not present

## 2016-05-08 DIAGNOSIS — E559 Vitamin D deficiency, unspecified: Secondary | ICD-10-CM | POA: Diagnosis not present

## 2016-05-10 DIAGNOSIS — K1121 Acute sialoadenitis: Secondary | ICD-10-CM | POA: Diagnosis not present

## 2016-05-29 DIAGNOSIS — Z6841 Body Mass Index (BMI) 40.0 and over, adult: Secondary | ICD-10-CM | POA: Diagnosis not present

## 2016-05-29 DIAGNOSIS — E669 Obesity, unspecified: Secondary | ICD-10-CM | POA: Diagnosis not present

## 2016-06-05 DIAGNOSIS — E669 Obesity, unspecified: Secondary | ICD-10-CM | POA: Diagnosis not present

## 2016-06-05 DIAGNOSIS — Z6841 Body Mass Index (BMI) 40.0 and over, adult: Secondary | ICD-10-CM | POA: Diagnosis not present

## 2016-06-27 ENCOUNTER — Other Ambulatory Visit: Payer: Self-pay | Admitting: Nurse Practitioner

## 2016-06-27 ENCOUNTER — Other Ambulatory Visit: Payer: Self-pay | Admitting: Family Medicine

## 2016-07-31 DIAGNOSIS — E119 Type 2 diabetes mellitus without complications: Secondary | ICD-10-CM | POA: Diagnosis not present

## 2016-07-31 DIAGNOSIS — E559 Vitamin D deficiency, unspecified: Secondary | ICD-10-CM | POA: Diagnosis not present

## 2016-07-31 DIAGNOSIS — E782 Mixed hyperlipidemia: Secondary | ICD-10-CM | POA: Diagnosis not present

## 2016-07-31 DIAGNOSIS — M109 Gout, unspecified: Secondary | ICD-10-CM | POA: Diagnosis not present

## 2016-09-14 ENCOUNTER — Encounter: Payer: Self-pay | Admitting: Obstetrics & Gynecology

## 2016-09-14 ENCOUNTER — Ambulatory Visit (INDEPENDENT_AMBULATORY_CARE_PROVIDER_SITE_OTHER): Payer: BLUE CROSS/BLUE SHIELD | Admitting: Obstetrics & Gynecology

## 2016-09-14 VITALS — BP 150/110 | HR 76 | Ht 64.0 in | Wt 276.0 lb

## 2016-09-14 DIAGNOSIS — Z01419 Encounter for gynecological examination (general) (routine) without abnormal findings: Secondary | ICD-10-CM | POA: Diagnosis not present

## 2016-09-14 DIAGNOSIS — R8290 Unspecified abnormal findings in urine: Secondary | ICD-10-CM

## 2016-09-14 DIAGNOSIS — Z1211 Encounter for screening for malignant neoplasm of colon: Secondary | ICD-10-CM

## 2016-09-14 DIAGNOSIS — Z1212 Encounter for screening for malignant neoplasm of rectum: Secondary | ICD-10-CM

## 2016-09-14 LAB — POCT URINALYSIS DIPSTICK
Glucose, UA: NEGATIVE
KETONES UA: NEGATIVE
LEUKOCYTES UA: NEGATIVE
NITRITE UA: NEGATIVE
PROTEIN UA: NEGATIVE

## 2016-09-14 LAB — HEMOCCULT GUIAC POC 1CARD (OFFICE): FECAL OCCULT BLD: NEGATIVE

## 2016-09-14 NOTE — Progress Notes (Signed)
Subjective:     Joanna Reid is a 51 y.o. female here for a routine exam.  Has not been seen in 4.5 years   No LMP recorded. Patient has had a hysterectomy. No obstetric history on file. Birth Control Method:  hysterectomy Menstrual Calendar(currently): amenorrheic  Current complaints: had concern about an area in her breast and also urine being concentrated(she drinks lots of mountain dews).   Current acute medical issues:  None, BP this am at home was good she has white coat syndrome   Recent Gynecologic History No LMP recorded. Patient has had a hysterectomy. Last Pap: ,   Last mammogram: 2017, May,  normal  Past Medical History:  Diagnosis Date  . Acid reflux   . Allergic rhinitis   . Diabetes mellitus    type 2  . Gout   . Gout   . HBP (high blood pressure)   . Migraines     Past Surgical History:  Procedure Laterality Date  . CESAREAN SECTION    . EYE SURGERY    . HEMORRHOID SURGERY N/A 11/19/2012   Procedure: HEMORRHOIDECTOMY;  Surgeon: Jamesetta So, MD;  Location: [facility];  Service: General;  Laterality: N/A;  . kidney stones  1998  . LAPAROSCOPIC UNILATERAL SALPINGO OOPHERECTOMY  05/14/2012   Procedure: LAPAROSCOPIC UNILATERAL SALPINGO OOPHORECTOMY;  Surgeon: [name], MD;  Location: [facility];  Service: Gynecology;  Laterality: Right;  laparoscopic right salpingo-oophorectomy  . PARTIAL HYSTERECTOMY    . TONSILECTOMY, ADENOIDECTOMY, BILATERAL MYRINGOTOMY AND TUBES    . VESICOVAGINAL FISTULA CLOSURE W/ TAH      OB History    No data available      Social History   Social History  . Marital status: Married    Spouse name: N/A  . Number of children: N/A  . Years of education: 24   Occupational History  .  [employer]   Social History Main Topics  . Smoking status: Former Smoker    Quit date: 02/27/1999  . Smokeless tobacco: Never Used     Comment: socially   . Alcohol use No  . Drug use: No  . Sexual activity: Yes   Other Topics Concern  .  None   Social History Narrative  . None    Family History  Problem Relation Age of Onset  . Arthritis    . Cancer    . Diabetes    . Hypertension Mother   . Diabetes Father   . Diabetes Brother   . Hypertension Brother      Current Outpatient Prescriptions:  .  allopurinol (ZYLOPRIM) 300 MG tablet, TAKE 1 TABLET DAILY FOR GOUT, Disp: 90 tablet, Rfl: 0 .  amLODipine (NORVASC) 5 MG tablet, TAKE 1 TABLET BY MOUTH  DAILY, Disp: 90 tablet, Rfl: 1 .  bumetanide (BUMEX) 1 MG tablet, Take 1 tablet (1 mg total) by mouth daily., Disp: 90 tablet, Rfl: 1 .  docusate sodium (COLACE) 100 MG capsule, Take 100 mg by mouth daily as needed for mild constipation., Disp: , Rfl:  .  metFORMIN (GLUCOPHAGE) 500 MG tablet, Take 1 tablet by mouth once daily with breakfast, Disp: 30 tablet, Rfl: 5 .  omeprazole (PRILOSEC) 20 MG capsule, TAKE 1 CAPSULE DAILY, Disp: 90 capsule, Rfl: 0 .  valsartan (DIOVAN) 320 MG tablet, TAKE 1 TABLET BY MOUTH  DAILY, Disp: 90 tablet, Rfl: 1 .  verapamil (CALAN-SR) 240 MG CR tablet, Take 1 tablet (240 mg total) by mouth at bedtime., Disp: 30  tablet, Rfl: 5 .  fluticasone (FLONASE) 50 MCG/ACT nasal spray, Place 2 sprays into the nose 2 (two) times daily., Disp: , Rfl:   Review of Systems  Review of Systems  Constitutional: Negative for fever, chills, weight loss, malaise/fatigue and diaphoresis.  HENT: Negative for hearing loss, ear pain, nosebleeds, congestion, sore throat, neck pain, tinnitus and ear discharge.   Eyes: Negative for blurred vision, double vision, photophobia, pain, discharge and redness.  Respiratory: Negative for cough, hemoptysis, sputum production, shortness of breath, wheezing and stridor.   Cardiovascular: Negative for chest pain, palpitations, orthopnea, claudication, leg swelling and PND.  Gastrointestinal: negative for abdominal pain. Negative for heartburn, nausea, vomiting, diarrhea, constipation, blood in stool and melena.  Genitourinary:  Negative for dysuria, urgency, frequency, hematuria and flank pain.  Musculoskeletal: Negative for myalgias, back pain, joint pain and falls.  Skin: Negative for itching and rash.  Neurological: Negative for dizziness, tingling, tremors, sensory change, speech change, focal weakness, seizures, loss of consciousness, weakness and headaches.  Endo/Heme/Allergies: Negative for environmental allergies and polydipsia. Does not bruise/bleed easily.  Psychiatric/Behavioral: Negative for depression, suicidal ideas, hallucinations, memory loss and substance abuse. The patient is not nervous/anxious and does not have insomnia.        Objective:  Blood pressure (!) 150/110, pulse 76, height 5\' 4"  (1.626 m), weight 276 lb (125.2 kg).   Physical Exam  Vitals reviewed. Constitutional: She is oriented to person, place, and time. She appears well-developed and well-nourished.  HENT:  Head: Normocephalic and atraumatic.        Right Ear: External ear normal.  Left Ear: External ear normal.  Nose: Nose normal.  Mouth/Throat: Oropharynx is clear and moist.  Eyes: Conjunctivae and EOM are normal. Pupils are equal, round, and reactive to light. Right eye exhibits no discharge. Left eye exhibits no discharge. No scleral icterus.  Neck: Normal range of motion. Neck supple. No tracheal deviation present. No thyromegaly present.  Cardiovascular: Normal rate, regular rhythm, normal heart sounds and intact distal pulses.  Exam reveals no gallop and no friction rub.   No murmur heard. Respiratory: Effort normal and breath sounds normal. No respiratory distress. She has no wheezes. She has no rales. She exhibits no tenderness.  GI: Soft. Bowel sounds are normal. She exhibits no distension and no mass. There is no tenderness. There is no rebound and no guarding.  Genitourinary:  Breasts no masses skin changes or nipple changes bilaterally, specifically no masses in area of concern      Vulva is normal without  lesions Vagina is pink moist without discharge Cervix absent Uterus is absent Adnexa is negative Rectal hemeoccult negative, normal tone, no masses } Musculoskeletal: Normal range of motion. She exhibits no edema and no tenderness.  Neurological: She is alert and oriented to person, place, and time. She has normal reflexes. She displays normal reflexes. No cranial nerve deficit. She exhibits normal muscle tone. Coordination normal.  Skin: Skin is warm and dry. No rash noted. No erythema. No pallor.  Psychiatric: She has a normal mood and affect. Her behavior is normal. Judgment and thought content normal.       Medications Ordered at today's visit: No orders of the defined types were placed in this encounter.   Other orders placed at today's visit: No orders of the defined types were placed in this encounter.     Assessment:    Healthy female exam.    Plan:    Mammogram ordered. Follow up in: prn years.  No Follow-up on file.

## 2016-09-14 NOTE — Addendum Note (Signed)
Addended by: Linton Rump on: 09/14/2016 10:05 AM   Modules accepted: Orders

## 2016-09-17 ENCOUNTER — Other Ambulatory Visit: Payer: Self-pay | Admitting: Obstetrics & Gynecology

## 2016-09-17 LAB — URINE CULTURE

## 2016-09-17 MED ORDER — SULFAMETHOXAZOLE-TRIMETHOPRIM 800-160 MG PO TABS: 1.0000 | ORAL_TABLET | Freq: Two times a day (BID) | ORAL | 0 refills | Status: DC

## 2016-09-17 NOTE — Telephone Encounter (Signed)
LMOVM that prescription was sent 

## 2016-09-21 ENCOUNTER — Telehealth: Payer: Self-pay | Admitting: Obstetrics and Gynecology

## 2016-09-21 NOTE — Telephone Encounter (Signed)
Patient was prescribed Bactrim on 3/12 and now has a yeast infection. She would like a prescription for Diflucan. Please advise.

## 2016-09-21 NOTE — Telephone Encounter (Signed)
Pt called stating that Dr. Glo Herring has called in an antibiotic for her and now she is having a yeast infection and would like for him to call her in [place]. Pt uses the [facility], Please contact pt

## 2016-09-24 ENCOUNTER — Encounter: Payer: Self-pay | Admitting: *Deleted

## 2016-09-24 ENCOUNTER — Other Ambulatory Visit: Payer: Self-pay | Admitting: Obstetrics & Gynecology

## 2016-09-24 MED ORDER — FLUCONAZOLE 150 MG PO TABS: 150.0000 mg | ORAL_TABLET | Freq: Once | ORAL | 0 refills | Status: AC

## 2016-09-24 NOTE — Telephone Encounter (Signed)
Refilled per request.

## 2016-09-25 DIAGNOSIS — J0110 Acute frontal sinusitis, unspecified: Secondary | ICD-10-CM | POA: Diagnosis not present

## 2016-09-30 ENCOUNTER — Other Ambulatory Visit: Payer: Self-pay | Admitting: Family Medicine

## 2016-10-16 ENCOUNTER — Ambulatory Visit (INDEPENDENT_AMBULATORY_CARE_PROVIDER_SITE_OTHER): Payer: BLUE CROSS/BLUE SHIELD | Admitting: Family Medicine

## 2016-10-16 ENCOUNTER — Encounter: Payer: Self-pay | Admitting: Family Medicine

## 2016-10-16 VITALS — BP 142/98 | Temp 97.6°F | Ht 64.5 in | Wt 270.0 lb

## 2016-10-16 DIAGNOSIS — N3000 Acute cystitis without hematuria: Secondary | ICD-10-CM | POA: Diagnosis not present

## 2016-10-16 DIAGNOSIS — I10 Essential (primary) hypertension: Secondary | ICD-10-CM | POA: Diagnosis not present

## 2016-10-16 DIAGNOSIS — R300 Dysuria: Secondary | ICD-10-CM | POA: Diagnosis not present

## 2016-10-16 DIAGNOSIS — E119 Type 2 diabetes mellitus without complications: Secondary | ICD-10-CM | POA: Diagnosis not present

## 2016-10-16 LAB — POCT URINALYSIS DIPSTICK
Spec Grav, UA: 1.005 (ref 1.030–1.035)
pH, UA: 6 (ref 5.0–8.0)

## 2016-10-16 LAB — POCT GLYCOSYLATED HEMOGLOBIN (HGB A1C): Hemoglobin A1C: 6.2

## 2016-10-16 MED ORDER — BUMETANIDE 1 MG PO TABS: 1.0000 mg | ORAL_TABLET | Freq: Every day | ORAL | 0 refills | Status: DC

## 2016-10-16 MED ORDER — FLUCONAZOLE 150 MG PO TABS: ORAL_TABLET | ORAL | 0 refills | Status: DC

## 2016-10-16 MED ORDER — VALSARTAN 320 MG PO TABS: 320.0000 mg | ORAL_TABLET | Freq: Every day | ORAL | 0 refills | Status: DC

## 2016-10-16 MED ORDER — VALACYCLOVIR HCL 1 G PO TABS: ORAL_TABLET | ORAL | 11 refills | Status: DC

## 2016-10-16 MED ORDER — METFORMIN HCL 500 MG PO TABS: ORAL_TABLET | ORAL | 0 refills | Status: DC

## 2016-10-16 MED ORDER — OMEPRAZOLE 20 MG PO CPDR: 20.0000 mg | DELAYED_RELEASE_CAPSULE | Freq: Every day | ORAL | 0 refills | Status: DC

## 2016-10-16 MED ORDER — CIPROFLOXACIN HCL 250 MG PO TABS: 250.0000 mg | ORAL_TABLET | Freq: Two times a day (BID) | ORAL | 0 refills | Status: DC

## 2016-10-16 MED ORDER — ALLOPURINOL 300 MG PO TABS: ORAL_TABLET | ORAL | 0 refills | Status: DC

## 2016-10-16 MED ORDER — VERAPAMIL HCL ER 240 MG PO TBCR: 240.0000 mg | EXTENDED_RELEASE_TABLET | Freq: Every day | ORAL | 0 refills | Status: DC

## 2016-10-16 NOTE — Progress Notes (Signed)
   Subjective:    Patient ID: Joanna Reid, female    DOB: 03-Oct-1965, 51 y.o.   MRN: [id number]  Urinary Tract Infection   Episode onset: october 2017. Associated symptoms comments: Abdominal pain, dysuria. She has tried antibiotics for the symptoms.   Two days prev to last pill   Results for orders placed or performed in visit on 10/16/16  Urine culture  Result Value Ref Range   Urine Culture, Routine Final report (A)    Urine Culture result 1 Escherichia coli (A)    ANTIMICROBIAL SUSCEPTIBILITY Comment   POCT urinalysis dipstick  Result Value Ref Range   Color, UA     Clarity, UA     Glucose, UA     Bilirubin, UA     Ketones, UA     Spec Grav, UA 1.005 1.030 - 1.035   Blood, UA     pH, UA 6.0 5.0 - 8.0   Protein, UA     Urobilinogen, UA  Negative - 2.0   Nitrite, UA     Leukocytes, UA  Negative  POCT glycosylated hemoglobin (Hb A1C)  Result Value Ref Range   Hemoglobin A1C 6.2      Patient claims compliance with diabetes medication. No obvious side effects. Reports no substantial low sugar spells. Most numbers are generally in good range when checked fasting. Generally does not miss a dose of medication. Watching diabetic diet closely  Blood pressure medicine and blood pressure levels reviewed today with patient. Compliant with blood pressure medicine. States does not miss a dose. No obvious side effects. Blood pressure generally good when checked elsewhere. Watching salt intake.  Oct had a uti and was given abx for it Not much walking. pts exercise partner passed away in 07-30-23, not using treadmill  Review of Systems No headache, no major weight loss or weight gain, no chest pain no back pain abdominal pain no change in bowel habits complete ROS otherwise negative     Objective:   Physical Exam  Alert and oriented, vitals reviewed and stable, NAD ENT-TM's and ext canals WNL bilat via otoscopic exam Soft palate, tonsils and post pharynx WNL via  oropharyngeal exam Neck-symmetric, no masses; thyroid nonpalpable and nontender Pulmonary-no tachypnea or accessory muscle use; Clear without wheezes via auscultation Card--no abnrml murmurs, rhythm reg and rate WNL Carotid pulses symmetric, without bruits  Feet sensation intact pulses good  Urinalysis 8-10 white blood cells per high-power field     Assessment & Plan:  Impression #1 probable urinary tract infection discussed likely confined bladder #2 type 2 diabetes good control discussed maintain same meds 3 hypertension discussed continue same meds importance of compliance discussed #4 gout clinically stable to maintain medication plan medications refilled. Diet exercise discussed. Antibiotics prescribed. Urine culture done.

## 2016-10-18 LAB — URINE CULTURE

## 2016-10-23 DIAGNOSIS — E559 Vitamin D deficiency, unspecified: Secondary | ICD-10-CM | POA: Diagnosis not present

## 2016-10-23 DIAGNOSIS — E782 Mixed hyperlipidemia: Secondary | ICD-10-CM | POA: Diagnosis not present

## 2016-10-23 DIAGNOSIS — E669 Obesity, unspecified: Secondary | ICD-10-CM | POA: Diagnosis not present

## 2016-10-23 DIAGNOSIS — Z6841 Body Mass Index (BMI) 40.0 and over, adult: Secondary | ICD-10-CM | POA: Diagnosis not present

## 2016-10-23 DIAGNOSIS — M109 Gout, unspecified: Secondary | ICD-10-CM | POA: Diagnosis not present

## 2016-10-23 DIAGNOSIS — Z719 Counseling, unspecified: Secondary | ICD-10-CM | POA: Diagnosis not present

## 2016-10-23 DIAGNOSIS — Z008 Encounter for other general examination: Secondary | ICD-10-CM | POA: Diagnosis not present

## 2016-10-23 DIAGNOSIS — E119 Type 2 diabetes mellitus without complications: Secondary | ICD-10-CM | POA: Diagnosis not present

## 2016-11-12 ENCOUNTER — Emergency Department (HOSPITAL_COMMUNITY): Payer: BLUE CROSS/BLUE SHIELD

## 2016-11-12 ENCOUNTER — Encounter (HOSPITAL_COMMUNITY): Payer: Self-pay | Admitting: Cardiology

## 2016-11-12 ENCOUNTER — Emergency Department (HOSPITAL_COMMUNITY)
Admission: EM | Admit: 2016-11-12 | Discharge: 2016-11-12 | Disposition: A | Discharge status: Home / Self Care | Payer: BLUE CROSS/BLUE SHIELD | Attending: Emergency Medicine | Admitting: Emergency Medicine

## 2016-11-12 DIAGNOSIS — I10 Essential (primary) hypertension: Secondary | ICD-10-CM | POA: Diagnosis not present

## 2016-11-12 DIAGNOSIS — Z7984 Long term (current) use of oral hypoglycemic drugs: Secondary | ICD-10-CM | POA: Diagnosis not present

## 2016-11-12 DIAGNOSIS — S8391XA Sprain of unspecified site of right knee, initial encounter: Secondary | ICD-10-CM

## 2016-11-12 DIAGNOSIS — M25561 Pain in right knee: Secondary | ICD-10-CM | POA: Diagnosis not present

## 2016-11-12 DIAGNOSIS — Z79899 Other long term (current) drug therapy: Secondary | ICD-10-CM | POA: Insufficient documentation

## 2016-11-12 DIAGNOSIS — Y9301 Activity, walking, marching and hiking: Secondary | ICD-10-CM | POA: Insufficient documentation

## 2016-11-12 DIAGNOSIS — Y929 Unspecified place or not applicable: Secondary | ICD-10-CM | POA: Insufficient documentation

## 2016-11-12 DIAGNOSIS — R200 Anesthesia of skin: Secondary | ICD-10-CM | POA: Insufficient documentation

## 2016-11-12 DIAGNOSIS — E119 Type 2 diabetes mellitus without complications: Secondary | ICD-10-CM | POA: Diagnosis not present

## 2016-11-12 DIAGNOSIS — Y999 Unspecified external cause status: Secondary | ICD-10-CM | POA: Insufficient documentation

## 2016-11-12 DIAGNOSIS — W1839XA Other fall on same level, initial encounter: Secondary | ICD-10-CM | POA: Diagnosis not present

## 2016-11-12 DIAGNOSIS — Z87891 Personal history of nicotine dependence: Secondary | ICD-10-CM | POA: Diagnosis not present

## 2016-11-12 DIAGNOSIS — S838X1A Sprain of other specified parts of right knee, initial encounter: Secondary | ICD-10-CM | POA: Diagnosis not present

## 2016-11-12 DIAGNOSIS — S8991XA Unspecified injury of right lower leg, initial encounter: Secondary | ICD-10-CM | POA: Diagnosis present

## 2016-11-12 MED ORDER — KETOROLAC TROMETHAMINE 60 MG/2ML IM SOLN
60.0000 mg | Freq: Once | INTRAMUSCULAR | Status: AC
  Administered 2016-11-12: 60 mg via INTRAMUSCULAR
  Filled 2016-11-12: qty 2

## 2016-11-12 MED ORDER — HYDROCODONE-ACETAMINOPHEN 5-325 MG PO TABS: 2.0000 | ORAL_TABLET | ORAL | 0 refills | Status: DC | PRN

## 2016-11-12 MED ORDER — DEXAMETHASONE SODIUM PHOSPHATE 10 MG/ML IJ SOLN
10.0000 mg | Freq: Once | INTRAMUSCULAR | Status: AC
  Administered 2016-11-12: 10 mg via INTRAMUSCULAR
  Filled 2016-11-12: qty 1

## 2016-11-12 MED ORDER — IBUPROFEN 800 MG PO TABS: 800.0000 mg | ORAL_TABLET | Freq: Three times a day (TID) | ORAL | 0 refills | Status: DC

## 2016-11-12 NOTE — ED Notes (Signed)
Pt refuses crutches because she says she already has some at home to use.

## 2016-11-12 NOTE — Discharge Instructions (Signed)
Call Dr. Ruthe Mannan office for follow up in next week - Motrin 3 times daily - keep leg elevated, ice and compression with the knee immobilizer - ER for severe or worsening pain

## 2016-11-12 NOTE — ED Provider Notes (Signed)
[place] DEPT Provider Note   CSN: [id number] Arrival date & time: 11/12/16  2536  By signing my name below, I, Joanna Reid, attest that this documentation has been prepared under the direction and in the presence of [name], MD. Electronically Signed: Ethelle Lyon Reid, Scribe. 11/12/2016. 10:24 AM.  History   Chief Complaint Chief Complaint  Patient presents with  . Leg Pain   The history is provided by the patient and medical records. No language interpreter was used.    HPI Comments:  Joanna Reid is a 51 y.o. female with a PMHx of Migraines, Gout, and DM, who presents to the Emergency Department complaining of gradually worsening, 10/10 right leg pain onset two weeks ago. Pt reports she was walking in her home this morning, favoring the right leg d/t pain, when she heard a "pop" in the right knee and fell forward. She states she fell "gracefully with her arms outstretched". She reports having this "sciatic" pain before qualified as "hot lava in my right hip flowing down the side of my leg." Pt has associated symptoms of back pain and numbness in the right leg. No prior surgical intervention on the right ankle or h/o back surgery. She tried Tylenol at home with no relief of her pain. Pt denies fever, vomiting, diarrhea, SOB, and any other complaints at this time. She has seen at orthopedist in the past for prior foot trouble.    Past Medical History:  Diagnosis Date  . Acid reflux   . Allergic rhinitis   . Diabetes mellitus    type 2  . Gout   . Gout   . HBP (high blood pressure)   . Migraines    Patient Active Problem List   Diagnosis Date Noted  . Type 2 diabetes mellitus without complication, without Reid-term current use of insulin ([place]) 01/27/2016  . Morbid obesity due to excess calories ([place]) 01/27/2016  . Anxiety as acute reaction to exceptional stress 01/13/2015  . Attention deficit hyperactivity disorder (ADHD), 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  . Gout  01/20/2014  . GERD (gastroesophageal reflux disease) 01/20/2014  . Essential hypertension, benign 01/20/2014  . Plantar fasciitis of left foot 09/19/2011   Past Surgical History:  Procedure Laterality Date  . CESAREAN SECTION    . EYE SURGERY    . HEMORRHOID SURGERY N/A 11/19/2012   Procedure: HEMORRHOIDECTOMY;  Surgeon: Jamesetta So, MD;  Location: [facility];  Service: General;  Laterality: N/A;  . kidney stones  1998  . LAPAROSCOPIC UNILATERAL SALPINGO OOPHERECTOMY  05/14/2012   Procedure: LAPAROSCOPIC UNILATERAL SALPINGO OOPHORECTOMY;  Surgeon: [name], MD;  Location: [facility];  Service: Gynecology;  Laterality: Right;  laparoscopic right salpingo-oophorectomy  . PARTIAL HYSTERECTOMY    . TONSILECTOMY, ADENOIDECTOMY, BILATERAL MYRINGOTOMY AND TUBES    . VESICOVAGINAL FISTULA CLOSURE W/ TAH     OB History    No data available     Home Medications    Prior to Admission medications   Medication Sig Start Date End Date Taking? Authorizing Provider  albuterol (PROVENTIL HFA;VENTOLIN HFA) 108 (90 Base) MCG/ACT inhaler Inhale 1-2 puffs into the lungs daily as needed.   Yes [provider]  allopurinol (ZYLOPRIM) 300 MG tablet TAKE 1 TABLET DAILY FOR GOUT 10/16/16  Yes Mikey Kirschner, MD  bumetanide (BUMEX) 1 MG tablet Take 1 tablet (1 mg total) by mouth daily. 10/16/16  Yes Mikey Kirschner, MD  docusate sodium (COLACE) 100 MG capsule Take 100 mg by mouth  daily as needed for mild constipation.   Yes [provider]  fluticasone (FLONASE) 50 MCG/ACT nasal spray Place 2 sprays into the nose 2 (two) times daily.   Yes [provider]  metFORMIN (GLUCOPHAGE) 500 MG tablet Take 1 tablet by mouth once daily with breakfast 10/16/16  Yes Mikey Kirschner, MD  omeprazole (PRILOSEC) 20 MG capsule Take 1 capsule (20 mg total) by mouth daily. 10/16/16  Yes Mikey Kirschner, MD  valACYclovir (VALTREX) 1000 MG tablet Take 2 tablets now and 2 tablets 12 hours  later Patient taking differently: Take 1,000 mg by mouth daily as needed (cold sore). Take 2 tablets now and 2 tablets 12 hours later 10/16/16  Yes Mikey Kirschner, MD  valsartan (DIOVAN) 320 MG tablet Take 1 tablet (320 mg total) by mouth daily. 10/16/16  Yes Mikey Kirschner, MD  verapamil (CALAN-SR) 240 MG CR tablet Take 1 tablet (240 mg total) by mouth at bedtime. 10/16/16  Yes Mikey Kirschner, MD  HYDROcodone-acetaminophen (NORCO/VICODIN) 5-325 MG tablet Take 2 tablets by mouth every 4 (four) hours as needed. 11/12/16   [name], MD  ibuprofen (ADVIL,MOTRIN) 800 MG tablet Take 1 tablet (800 mg total) by mouth 3 (three) times daily. 11/12/16   [name], MD   Family History Family History  Problem Relation Age of Onset  . Arthritis    . Cancer    . Diabetes    . Hypertension Mother   . Diabetes Father   . Diabetes Brother   . Hypertension Brother    Social History Social History  Substance Use Topics  . Smoking status: Former Smoker    Quit date: 02/27/1999  . Smokeless tobacco: Never Used     Comment: socially   . Alcohol use No   Allergies   Patient has no known allergies.   Review of Systems Review of Systems  Constitutional: Negative for fever.  Respiratory: Negative for shortness of breath.   Gastrointestinal: Negative for diarrhea and vomiting.  Musculoskeletal: Positive for arthralgias and back pain.  Neurological: Positive for numbness.  All other systems reviewed and are negative.    Physical Exam Updated Vital Signs BP (!) 178/119 (BP Location: Left Arm)   Pulse 75   Temp 97.9 F (36.6 C) (Oral)   Resp 18   Ht 5\' 4"  (1.626 m)   Wt 270 lb (122.5 kg)   SpO2 99%   BMI 46.35 kg/m   Physical Exam  Constitutional: She is oriented to person, place, and time. She appears well-developed and well-nourished.  HENT:  Head: Normocephalic.  Eyes: Conjunctivae are normal.  Cardiovascular: Normal rate.   Pulmonary/Chest: Effort normal.  Abdominal: She  exhibits no distension.  Musculoskeletal: Normal range of motion.  Normal extension of great toe against resistance. TTP lateral inferior knee and calf. Pain with straight leg raise on the right. Cannot bend past 20 degrees, no obvious asymmetry. No edema.  Neurological: She is alert and oriented to person, place, and time.  Decreased first web space toe on the right. L5 distrubution. All other leg sesnation normal and strnght normal.   Skin: Skin is warm and dry.  Psychiatric: She has a normal mood and affect.  Nursing note and vitals reviewed.   ED Treatments / Results  DIAGNOSTIC STUDIES:  Oxygen Saturation is 99% on RA, normal by my interpretation.    COORDINATION OF CARE:  9:33 AM Discussed treatment plan with pt at bedside including anti-inflammatories, XR of the right knee, steroids,  crutches, knee immobilizer, and pt agreed to plan.  Labs (all labs ordered are listed, but only abnormal results are displayed) Labs Reviewed - No data to display   Radiology Dg Knee Complete 4 Views Right  Result Date: 11/12/2016 CLINICAL DATA:  Right knee pain after injury today. EXAM: RIGHT KNEE - COMPLETE 4+ VIEW COMPARISON:  None. FINDINGS: No evidence of fracture, dislocation, or joint effusion. Moderate narrowing of medial joint space is noted. Mild spurring of patella is noted. Soft tissues are unremarkable. IMPRESSION: Moderate degenerative joint disease is noted medially. No acute abnormality in the right knee. Electronically Signed   By: Marijo Conception, M.D.   On: 11/12/2016 10:20    Procedures Procedures (including critical care time)  Medications Ordered in ED Medications  ketorolac (TORADOL) injection 60 mg (60 mg Intramuscular Given 11/12/16 0954)  dexamethasone (DECADRON) injection 10 mg (10 mg Intramuscular Given 11/12/16 0955)    Initial Impression / Assessment and Plan / ED Course  I have reviewed the triage vital signs and the nursing notes.  Pertinent labs & imaging  results that were available during my care of the patient were reviewed by me and considered in my medical decision making (see chart for details).     Likely has some element of L5 radiculopathy which is not acute  Has some Knee pain / without deformity or fracture NSaids and decadron given Pt informed of results, knee immobilizer and crutches given Stable for d/c - has seen Aline Brochure in past.  Final Clinical Impressions(s) / ED Diagnoses   Final diagnoses:  Acute pain of right knee  Sprain of right knee, unspecified ligament, initial encounter    New Prescriptions New Prescriptions   HYDROCODONE-ACETAMINOPHEN (NORCO/VICODIN) 5-325 MG TABLET    Take 2 tablets by mouth every 4 (four) hours as needed.   IBUPROFEN (ADVIL,MOTRIN) 800 MG TABLET    Take 1 tablet (800 mg total) by mouth 3 (three) times daily.    I personally performed the services described in this documentation, which was scribed in my presence. The recorded information has been reviewed and is accurate.       [name], MD 11/12/16 [phone]

## 2016-11-12 NOTE — ED Triage Notes (Signed)
Right leg pain  (sciatica) .  Worse this morning.  States her right leg gave way with her and she fell this morning.

## 2016-11-13 ENCOUNTER — Ambulatory Visit (INDEPENDENT_AMBULATORY_CARE_PROVIDER_SITE_OTHER): Payer: BLUE CROSS/BLUE SHIELD | Admitting: Orthopedic Surgery

## 2016-11-13 ENCOUNTER — Ambulatory Visit: Payer: BLUE CROSS/BLUE SHIELD | Admitting: Family Medicine

## 2016-11-13 DIAGNOSIS — M25561 Pain in right knee: Secondary | ICD-10-CM | POA: Diagnosis not present

## 2016-11-13 DIAGNOSIS — M5431 Sciatica, right side: Secondary | ICD-10-CM | POA: Diagnosis not present

## 2016-11-13 NOTE — Progress Notes (Signed)
NEW PATIENT OFFICE VISIT    Chief Complaint  Patient presents with  . Knee Pain    Painful right knee 11/12/2016    HPI 51 year old female with a history of sciatica for the last 2 weeks recurrent she's had symptoms for over 8 years. This is usually managed with physical therapy that she doesn't her own. She was walking her right leg gave way right knee popped she went to the emergency room complaining of new onset dull bilateral knee pain which was associated with swelling which disappeared after the pop. Review of Systems  Constitutional: Negative for chills, fever and weight loss.  Musculoskeletal: Positive for back pain, falls and joint pain.  Skin: Negative for rash.  Neurological: Positive for tingling. Negative for focal weakness.     Past Medical History:  Diagnosis Date  . Acid reflux   . Allergic rhinitis   . Diabetes mellitus    type 2  . Gout   . Gout   . HBP (high blood pressure)   . Migraines     Past Surgical History:  Procedure Laterality Date  . CESAREAN SECTION    . EYE SURGERY    . HEMORRHOID SURGERY N/A 11/19/2012   Procedure: HEMORRHOIDECTOMY;  Surgeon: Jamesetta So, MD;  Location: [facility];  Service: General;  Laterality: N/A;  . kidney stones  1998  . LAPAROSCOPIC UNILATERAL SALPINGO OOPHERECTOMY  05/14/2012   Procedure: LAPAROSCOPIC UNILATERAL SALPINGO OOPHORECTOMY;  Surgeon: [name], MD;  Location: [facility];  Service: Gynecology;  Laterality: Right;  laparoscopic right salpingo-oophorectomy  . PARTIAL HYSTERECTOMY    . TONSILECTOMY, ADENOIDECTOMY, BILATERAL MYRINGOTOMY AND TUBES    . VESICOVAGINAL FISTULA CLOSURE W/ TAH      Family History  Problem Relation Age of Onset  . Arthritis    . Cancer    . Diabetes    . Hypertension Mother   . Diabetes Father   . Diabetes Brother   . Hypertension Brother    Social History  Substance Use Topics  . Smoking status: Former Smoker    Quit date: 02/27/1999  . Smokeless tobacco: Never Used   Comment: socially   . Alcohol use No    There were no vitals taken for this visit.  Physical Exam  Constitutional: She is oriented to person, place, and time. She appears well-developed and well-nourished.  Neurological: She is alert and oriented to person, place, and time.  Psychiatric: She has a normal mood and affect.  Vitals reviewed.   Ortho Exam She is ambulatory with some brace and supportive device  She has mild tenderness over the right knee she has painful range of motion passive range of motion was 45 she was stable at 0 in all planes she had no effusion. She was tender over the medial joint line. I could not perform the McMurray's maneuver. Skin was intact and sensation was normal she had good pulse distally. She had no motor deficits.  On the left side we had a normal-looking knee with normal range of motion stability strength and alignment pulse sensation were normal distally and skin was intact   No orders of the defined types were placed in this encounter.   Encounter Diagnoses  Name Primary?  . Sciatica of right side Yes  . Acute pain of right knee      PLAN: Plain films show osteoarthritis  I sent her for physical therapy. I don't see any real knee pathology at think her right knee just gave way  from weakness from her chronic sciatica  She will be out of work for 2 weeks she will wear the brace for 2 weeks you supportive devices as needed and follow-up as needed.

## 2016-11-13 NOTE — Patient Instructions (Signed)
OOW X 2 weeks   Brace for 2 weeks

## 2016-11-14 ENCOUNTER — Telehealth: Payer: Self-pay | Admitting: Orthopedic Surgery

## 2016-11-14 NOTE — Telephone Encounter (Signed)
Patient, who was seen yesterday, 11/13/16, called to relay that she has been scheduled for initial visit at [facility] on 11/26/16.  She is to return only as needed, and work note return date is 11/28/16.  Patient does wish to return to work by then, or does Dr Aline Brochure advise to extend out of work for up to 1 more week, in order for patient to have some of the therapy before returning to work.  Patient ph# is [phone]

## 2016-11-15 NOTE — Telephone Encounter (Signed)
Routing to Dr. Harrison to advise 

## 2016-11-15 NOTE — Telephone Encounter (Signed)
What ? Why cant she get PT Monday ?

## 2016-11-15 NOTE — Telephone Encounter (Signed)
Do you want patient to have more than one therapy visit in before she returns back to work? If so we will need to extend work note.

## 2016-11-15 NOTE — Telephone Encounter (Signed)
Can you clarify this a little more.Marland KitchenMarland Kitchen

## 2016-11-16 NOTE — Telephone Encounter (Signed)
Spoke with therapy and they will call and offer earlier appt

## 2016-11-19 ENCOUNTER — Encounter: Payer: Self-pay | Admitting: Orthopedic Surgery

## 2016-11-19 NOTE — Telephone Encounter (Signed)
Patient called to relay that she has in fact been scheduled for an earlier appointment date of 11/21/16 at [facility].  States she feels she will be okay to return to work as of the original date discussed, 11/28/16. Note updated to include "full duty, no restrictions" as requested.

## 2016-11-21 ENCOUNTER — Ambulatory Visit (HOSPITAL_COMMUNITY): Payer: BLUE CROSS/BLUE SHIELD | Attending: Orthopedic Surgery | Admitting: Physical Therapy

## 2016-11-21 ENCOUNTER — Telehealth: Payer: Self-pay | Admitting: Orthopedic Surgery

## 2016-11-21 DIAGNOSIS — R262 Difficulty in walking, not elsewhere classified: Secondary | ICD-10-CM | POA: Insufficient documentation

## 2016-11-21 DIAGNOSIS — M25661 Stiffness of right knee, not elsewhere classified: Secondary | ICD-10-CM | POA: Diagnosis not present

## 2016-11-21 DIAGNOSIS — M25561 Pain in right knee: Secondary | ICD-10-CM | POA: Diagnosis not present

## 2016-11-21 DIAGNOSIS — R296 Repeated falls: Secondary | ICD-10-CM

## 2016-11-21 NOTE — Telephone Encounter (Signed)
Patient stopped by office following her initial physical therapy visit at [facility]; orders are for 6 weeks of therapy.  Her recent office note indicates that she may return to work on 11/28/16, and that she is to Return AS NEEDED.   Due to nature of patient's work (she has provided a copy of her job description, which states"very heavy level" in reference to "weights lifted and work postures assumed" - extensive walking and stair climbing is also included in her work) - patient is asking if she should be out of work for longer duration, based on this and/or if she should return here for follow up appointment after completing therapy.  Patient's [phone]

## 2016-11-21 NOTE — Patient Instructions (Addendum)
Knee Extension (Sitting)    Place ___0_ pound weight on right  ankle and straighten knee fully, lower slowly. Repeat _10___ times per set. Do _1___ sets per session. Do __2-3__ sessions per day.  http://orth.exer.us/732   Copyright  VHI. All rights reserved.  Strengthening: Quadriceps Set    Tighten muscles on top of thighs by pushing knees down into surface. Hold _5___ seconds. Repeat 10____ times per set. Do 1____ sets per session. Do _2-3___ sessions per day.  http://orth.exer.us/602   Copyright  VHI. All rights reserved.  Strengthening: Straight Leg Raise (Phase 1)    Tighten muscles on front of right thigh, then lift leg __18__ inches from surface, keeping knee locked.  Repeat 10____ times per set. Do _1___ sets per session. Do ___2-3_ sessions per day.  http://orth.exer.us/614   Copyright  VHI. All rights reserved.  Bridging    Slowly raise buttocks from floor, keeping stomach tight. Repeat __10__ times per set. Do __1__ sets per session. Do __2__ sessions per day. opyright  VHI. All rights reserved.  Self-Mobilization: Heel Slide (Supine)    Slide right heel toward buttocks until a gentle stretch is felt. Hold _3___ seconds. Relax. Repeat __10__ times per set. Do _1___ sets per session. Do __2__ sessions per day.    http://orth.exer.us/1096   Copyright  VHI. All rights reserved.  Strengthening: Hip Abduction (Side-Lying)    Tighten muscles on front of right thigh, then lift leg _15___ inches from surface, keeping knee locked.  Repeat _10___ times per set. Do __1__ sets per session. Do _2___ sessions per day.  http://orth.exer.us/622   C Copyright  VHI. All rights reserved.  Self-Mobilization: Knee Flexion (Prone)    Bring right  heel toward buttocks as close as possible. Hold __2__ seconds. Relax. Repeat __10__ times per set. Do __1__ sets per session. Do ___2_ sessions per day.  http://orth.exer.us/596   Copyright  VHI. All rights  reserved.  Strengthening: Hip Extension (Prone)    Tighten muscles on front of left thigh, then lift leg ___2_ inches from surface, keeping knee locked. Repeat _10___ times per set. Do 1____ sets per session. Do ___2_ sessions per day.  http://orth.exer.us/620   Copyright  VHI. All rights reserved.

## 2016-11-21 NOTE — Therapy (Signed)
[place], [place], [address] Phone: [phone]   Fax:  [phone]  Physical Therapy Evaluation  Patient Details  Name: Joanna Reid MRN: [id number] Date of Birth: [birth year] Referring Provider: Arther Abbott   Encounter Date: 11/21/2016      PT End of Session - 11/21/16 1458    Visit Number 1   Number of Visits 18   Date for PT Re-Evaluation 12/21/16   Authorization Type BCBS   Authorization - Visit Number 1   Authorization - Number of Visits 18   PT Start Time 5361   PT Stop Time 1515   PT Time Calculation (min) 42 min   Activity Tolerance Patient tolerated treatment well   Behavior During Therapy [facility] for tasks assessed/performed      Past Medical History:  Diagnosis Date  . Acid reflux   . Allergic rhinitis   . Diabetes mellitus    type 2  . Gout   . Gout   . HBP (high blood pressure)   . Migraines     Past Surgical History:  Procedure Laterality Date  . CESAREAN SECTION    . EYE SURGERY    . HEMORRHOID SURGERY N/A 11/19/2012   Procedure: HEMORRHOIDECTOMY;  Surgeon: Jamesetta So, MD;  Location: [facility];  Service: General;  Laterality: N/A;  . kidney stones  1998  . LAPAROSCOPIC UNILATERAL SALPINGO OOPHERECTOMY  05/14/2012   Procedure: LAPAROSCOPIC UNILATERAL SALPINGO OOPHORECTOMY;  Surgeon: [name], MD;  Location: [facility];  Service: Gynecology;  Laterality: Right;  laparoscopic right salpingo-oophorectomy  . PARTIAL HYSTERECTOMY    . TONSILECTOMY, ADENOIDECTOMY, BILATERAL MYRINGOTOMY AND TUBES    . VESICOVAGINAL FISTULA CLOSURE W/ TAH      There were no vitals filed for this visit.       Subjective Assessment - 11/21/16 1432    Subjective Joanna Reid states that she began having what she felt was sicatica  on 10/29/2016 stating that she was having a little pain in her right hip and pain down the side of her leg that went to her foot.  She was also having numbness in her  foot.  She noted that she was limping when she walked.  She states that she was in her kitchen and felt a "pop" in her right knee and fell.  She went to the ER, then went to the orthopod who sent her to therapy.  She states that she fell again this morning she was walking without her crutches and felt a "pop" and fell again.    Pertinent History HTN, DM    How long can you sit comfortably? Able to sit 15 minutes in comfort   How long can you stand comfortably? Able to stand for 15 minutes    How long can you walk comfortably? Walking with an immobilizer with crutches for 10 minutes    Patient Stated Goals walk normal again, decrease pain, work    Currently in Pain? Yes   Pain Score 6    Pain Location Knee   Pain Orientation Right   Pain Descriptors / Indicators Aching;Throbbing   Pain Type Acute pain   Pain Onset 1 to 4 weeks ago   Pain Frequency Constant   Aggravating Factors  activity   Pain Relieving Factors icing, elevating , knee immobilizer   Effect of Pain on Daily Activities increases    Multiple Pain Sites No  [facility] PT Assessment - 11/21/16 0001      Assessment   Medical Diagnosis Rt sciatica/Rt knee pain   Referring Provider Arther Abbott    Onset Date/Surgical Date 10/27/16   Next MD Visit unknown   Prior Therapy none     Precautions   Precautions None;Fall     Restrictions   Weight Bearing Restrictions Yes     Balance Screen   Has the patient fallen in the past 6 months Yes   How many times? 2   Has the patient had a decrease in activity level because of a fear of falling?  Yes   Is the patient reluctant to leave their home because of a fear of falling?  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[employer], walking      Cognition   Overall Cognitive Status Within  Functional Limits for tasks assessed     Observation/Other Assessments   Focus on Therapeutic Outcomes (FOTO)  30     Functional Tests   Functional tests Sit to Stand     Sit to Stand   Comments unable      ROM / Strength   AROM / PROM / Strength AROM;Strength     AROM   AROM Assessment Site Knee   Right/Left Knee Right   Right Knee Extension 0   Right Knee Flexion 65     Strength   Strength Assessment Site Hip;Knee;Ankle   Right/Left Hip Right;Left   Right Hip Flexion 3-/5   Right Hip Extension 3-/5   Right Hip ABduction 4-/5   Right/Left Knee Right;Left   Right Knee Flexion 4-/5   Right Knee Extension 3/5   Right/Left Ankle Right;Left   Right Ankle Dorsiflexion 5/5     Ambulation/Gait   Ambulation/Gait Yes   Ambulation/Gait Assistance 6: Modified independent (Device/Increase time)   Assistive device Crutches;Other (Comment)  knee immobilizer    Gait Pattern Step-to pattern   Gait Comments Pt instructed in ambulation with one crutch using a step through gait pattern.                    [facility] Adult PT Treatment/Exercise - 11/21/16 0001      Exercises   Exercises Knee/Hip     Knee/Hip Exercises: Seated   Long Arc Quad Right;10 reps     Knee/Hip Exercises: Supine   Quad Sets Strengthening;Right;10 reps   Heel Slides 10 reps   Bridges 10 reps   Straight Leg Raises Strengthening;Right;5 reps     Knee/Hip Exercises: Sidelying   Hip ABduction Strengthening;Right;10 reps     Knee/Hip Exercises: Prone   Hamstring Curl 10 reps   Hip Extension Right;10 reps                PT Education - 11/21/16 1458    Education provided Yes   Education Details HEP   Person(s) Educated Patient   Methods Explanation;Handout;Demonstration   Comprehension Verbalized understanding          PT Short Term Goals - 11/21/16 1556      PT SHORT TERM GOAL #1   Title PT to be walking without crutches for at least 20 minutes at a time.    Time 3   Period  Weeks   Status  New     PT SHORT TERM GOAL #2   Title [name] flexion to be to 95 to allow pt to be able to sit in comfort for up to 30 minutes to enjoy eating a meal.    Time 3   Period Weeks   Status New     PT SHORT TERM GOAL #3   Title Pt to be able to stand in comfort for 30 minutes to be able to make a meal.    Time 3   Period Weeks   Status New     PT SHORT TERM GOAL #4   Title Pt pain in her right knee to be no greater than a 4/10 to allow her to sleep throughout the night    Time 3   Period Weeks   Status New           PT Long Term Goals - 11/21/16 1559      PT LONG TERM GOAL #1   Title Pt ROM in her right knee to be to 115 to allow her to squat down in comfort.    Time 6   Period Weeks   Status New     PT LONG TERM GOAL #2   Title Pt strength in her right knee to be increased by one grade to allow pt to arise from a squatted position.    Time 6   Period Weeks   Status New     PT LONG TERM GOAL #3   Title Pt to be able to walk without crutches or knee immobilzer on for up to an hour in order to complete housework    Time 6   Period Weeks   Status New     PT LONG TERM GOAL #4   Title Pt right knee pain to be no greater than a 2/10 to allow her to return to work duties.    Time 6   Period Weeks   Status New               Plan - 11/21/16 1500    Clinical Impression Statement Joanna Reid is a 51 yo female who injured her Rt knee after a fall caused by possible sciatica.  She has been referred to skilled therapy to increase her functional ability.  At this time it is difficult to evaluate her back due to the acute knee injury therefore we will concentrate on her knee at first and in the future we will evaluate her back.  Current evaluation demonstrates decreased right  knee  ROM, decreased Rt LE strength, decreased activity tolerance, abnormal gait and increased pain.  Joanna Reid will benefit from skilled physical therapy to maximize her  functional ability.      Rehab Potential Good   PT Frequency 3x / week   PT Duration 6 weeks   PT Treatment/Interventions ADLs/Self Care Home Management;Cryotherapy;Therapeutic exercise;Therapeutic activities;Functional mobility training;Stair training;Gait training;Balance training;Patient/family education;Passive range of motion;Manual techniques   PT Next Visit Plan Begin standing heel raise, functional squat, standing knee flexion, rockerboard and standing terminal extension    PT Home Exercise Plan qset, LAQ, SLR, bridge, hip abduction, prone knee flexion and hip extension    Consulted and Agree with Plan of Care Patient      Patient will benefit from skilled therapeutic intervention in order to improve the following deficits and impairments:  Abnormal gait, Decreased activity tolerance, Decreased range of motion, Decreased strength, Difficulty walking, Pain, Obesity, Decreased mobility  Visit Diagnosis: Stiffness of right knee, not elsewhere classified - Plan: PT plan of care cert/re-cert  Acute pain of right knee - Plan: PT plan of care cert/re-cert  Difficulty in walking, not elsewhere classified - Plan: PT plan of care cert/re-cert  Repeated falls - Plan: PT plan of care cert/re-cert      G-Codes - 32/91/91 1516    Functional Assessment Tool Used (Outpatient Only) foto   Functional Limitation Mobility: Walking and moving around   Mobility: Walking and Moving Around Current Status [phone]) At least 60 percent but less than 80 percent impaired, limited or restricted   Mobility: Walking and Moving Around Goal Status [phone]) At least 60 percent but less than 80 percent impaired, limited or restricted   Mobility: Walking and Moving Around Discharge Status [phone]) At least 40 percent but less than 60 percent impaired, limited or restricted       Problem List Patient Active Problem List   Diagnosis Date Noted  . Type 2 diabetes mellitus without complication, without long-term  current use of insulin ([place]) 01/27/2016  . Morbid obesity due to excess calories ([place]) 01/27/2016  . Anxiety as acute reaction to exceptional stress 01/13/2015  . Attention deficit hyperactivity disorder (ADHD), 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  . Gout 01/20/2014  . GERD (gastroesophageal reflux disease) 01/20/2014  . Essential hypertension, benign 01/20/2014  . Plantar fasciitis of left foot 09/19/2011    Rayetta Humphrey, PT CLT [phone] 11/21/2016, 4:06 PM  [place], [place], [address] Phone: [phone]   Fax:  [phone]  Name: Joanna Reid MRN: [id number] Date of Birth: [birth year]

## 2016-11-22 ENCOUNTER — Encounter: Payer: Self-pay | Admitting: Orthopedic Surgery

## 2016-11-22 NOTE — Telephone Encounter (Signed)
I keep answering this   Return as needed   Return to work as previously noted; if she is having trouble extend it

## 2016-11-22 NOTE — Telephone Encounter (Signed)
Called back to patient; updated note issued (extended out of work through date 12/09/16, return full duty no restrictions 12/10/16.

## 2016-11-23 ENCOUNTER — Ambulatory Visit (HOSPITAL_COMMUNITY): Payer: BLUE CROSS/BLUE SHIELD

## 2016-11-23 DIAGNOSIS — R296 Repeated falls: Secondary | ICD-10-CM | POA: Diagnosis not present

## 2016-11-23 DIAGNOSIS — M25561 Pain in right knee: Secondary | ICD-10-CM

## 2016-11-23 DIAGNOSIS — M25661 Stiffness of right knee, not elsewhere classified: Secondary | ICD-10-CM

## 2016-11-23 DIAGNOSIS — R262 Difficulty in walking, not elsewhere classified: Secondary | ICD-10-CM | POA: Diagnosis not present

## 2016-11-23 NOTE — Therapy (Signed)
[place] [address] [place], [place], [address] Phone: [phone]   Fax:  [phone]  Physical Therapy Treatment  Patient Details  Name: Joanna Reid MRN: [id number] Date of Birth: [birth year] Referring Provider: Arther Abbott   Encounter Date: 11/23/2016      PT End of Session - 11/23/16 1003    Visit Number 2   Number of Visits 18   Date for PT Re-Evaluation 12/21/16   Authorization Type BCBS   Authorization - Visit Number 2   Authorization - Number of Visits 18   PT Start Time 0945   PT Stop Time 1025   PT Time Calculation (min) 40 min   Activity Tolerance Patient tolerated treatment well;No increased pain   Behavior During Therapy WFL for tasks assessed/performed      Past Medical History:  Diagnosis Date  . Acid reflux   . Allergic rhinitis   . Diabetes mellitus    type 2  . Gout   . Gout   . HBP (high blood pressure)   . Migraines     Past Surgical History:  Procedure Laterality Date  . CESAREAN SECTION    . EYE SURGERY    . HEMORRHOID SURGERY N/A 11/19/2012   Procedure: HEMORRHOIDECTOMY;  Surgeon: Jamesetta So, MD;  Location: [facility];  Service: General;  Laterality: N/A;  . kidney stones  1998  . LAPAROSCOPIC UNILATERAL SALPINGO OOPHERECTOMY  05/14/2012   Procedure: LAPAROSCOPIC UNILATERAL SALPINGO OOPHORECTOMY;  Surgeon: [name], MD;  Location: [facility];  Service: Gynecology;  Laterality: Right;  laparoscopic right salpingo-oophorectomy  . PARTIAL HYSTERECTOMY    . TONSILECTOMY, ADENOIDECTOMY, BILATERAL MYRINGOTOMY AND TUBES    . VESICOVAGINAL FISTULA CLOSURE W/ TAH      There were no vitals filed for this visit.      Subjective Assessment - 11/23/16 0948    Subjective Pt reports she is feeling a little and stiff in hips and back today, not real pain today.    Pertinent History HTN, DM    Currently in Pain? Yes   Pain Score 4    Pain Location --  medial knee compartment    Pain  Orientation Right   Pain Descriptors / Indicators Aching;Throbbing                         [facility] Adult PT Treatment/Exercise - 11/23/16 0001      Exercises   Exercises Knee/Hip     Knee/Hip Exercises: Standing   Heel Raises Both;1 set;15 reps   Hip Abduction Right;1 set;15 reps;Knee straight   Other Standing Knee Exercises Chair Squat: 1x10   Standing TKE with blue monster band: 1x10x3sH      Knee/Hip Exercises: Seated   Long Arc Quad Right;10 reps;Weights   Long Arc Quad Weight 3 lbs.     Knee/Hip Exercises: Supine   Quad Sets Strengthening;Right;15 reps  5sH   Heel Slides Right;15 reps;AROM  5sH   Straight Leg Raises 1 set;10 reps   Knee Extension Limitations 5 degrees   Knee Flexion Limitations 112 degrees     Knee/Hip Exercises: Sidelying   Hip ABduction --     Knee/Hip Exercises: Prone   Hamstring Curl 15 reps   Hip Extension Right;15 reps;AROM                  PT Short Term Goals - 11/23/16 0949      PT SHORT TERM GOAL #  1   Title PT to be walking without crutches for at least 20 minutes at a time.    Time 3   Period Weeks   Status On-going     PT SHORT TERM GOAL #2   Title [name] flexion to be to 120 to allow pt to be able to sit in comfort for up to 30 minutes to enjoy eating a meal.    Time 3   Period Weeks   Status Revised     PT SHORT TERM GOAL #3   Title Pt to be able to stand in comfort for 30 minutes to be able to make a meal.    Time 3   Period Weeks   Status On-going     PT SHORT TERM GOAL #4   Title Pt pain in her right knee to be no greater than a 4/10 to allow her to sleep throughout the night    Time 3   Period Weeks   Status On-going           PT Long Term Goals - 11/23/16 0951      PT LONG TERM GOAL #1   Title Pt ROM in her right knee to be to 125 to allow her to squat down in comfort.    Time 6   Period Weeks   Status Revised     PT LONG TERM GOAL #2   Title Pt strength in her right  knee to be increased by one grade to allow pt to arise from a squatted position.    Time 6   Period Weeks   Status New     PT LONG TERM GOAL #3   Title Pt to be able to walk without crutches or knee immobilzer on for up to an hour in order to complete housework    Time 6   Period Weeks   Status New     PT LONG TERM GOAL #4   Title Pt right knee pain to be no greater than a 2/10 to allow her to return to work duties.    Time 6   Period Weeks   Status New               Plan - 11/23/16 1003    Clinical Impression Statement Goals, evaluation, and POC reviewe dwith patient in full. HEP is being performed correctly and with good compliance thus far. Pt with noted increase in activity tolerance and ROM (>75%) since last visit. ST and LT goals related to ROM have been updated to flext this dramatic increase. Making excellent progress toward goals so far. Added in more CKC exercises this session (heel raises, chair squats, etc).    Rehab Potential Good   PT Frequency 3x / week   PT Duration 6 weeks   PT Treatment/Interventions ADLs/Self Care Home Management;Cryotherapy;Therapeutic exercise;Therapeutic activities;Functional mobility training;Stair training;Gait training;Balance training;Patient/family education;Passive range of motion;Manual techniques   PT Next Visit Plan Continue with gentle progression of ROM and stretching. Continue to progress CKC exercises and start moving away from exercises that pt is already independnet with at home.    PT Home Exercise Plan qset, LAQ, SLR, bridge, hip abduction, prone knee flexion and hip extension    Consulted and Agree with Plan of Care Patient      Patient will benefit from skilled therapeutic intervention in order to improve the following deficits and impairments:  Abnormal gait, Decreased activity tolerance, Decreased range of motion, Decreased strength, Difficulty walking,  Pain, Obesity, Decreased mobility  Visit Diagnosis: Stiffness of  right knee, not elsewhere classified  Acute pain of right knee  Difficulty in walking, not elsewhere classified  Repeated falls     Problem List Patient Active Problem List   Diagnosis Date Noted  . Type 2 diabetes mellitus without complication, without long-term current use of insulin ([place]) 01/27/2016  . Morbid obesity due to excess calories ([place]) 01/27/2016  . Anxiety as acute reaction to exceptional stress 01/13/2015  . Attention deficit hyperactivity disorder (ADHD), 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  . Gout 01/20/2014  . GERD (gastroesophageal reflux disease) 01/20/2014  . Essential hypertension, benign 01/20/2014  . Plantar fasciitis of left foot 09/19/2011   10:17 AM, 11/23/16 Etta Grandchild, PT, DPT Physical Therapist at [facility] [phone] (office)      Etta Grandchild 11/23/2016, 10:17 AM  [place], [place], [address] Phone: [phone]   Fax:  [phone]  Name: Joanna Reid MRN: [id number] Date of Birth: [birth year]

## 2016-11-26 ENCOUNTER — Ambulatory Visit (HOSPITAL_COMMUNITY): Payer: BLUE CROSS/BLUE SHIELD | Admitting: Physical Therapy

## 2016-11-27 ENCOUNTER — Ambulatory Visit (HOSPITAL_COMMUNITY): Payer: BLUE CROSS/BLUE SHIELD | Admitting: Physical Therapy

## 2016-11-27 DIAGNOSIS — M25561 Pain in right knee: Secondary | ICD-10-CM

## 2016-11-27 DIAGNOSIS — M25661 Stiffness of right knee, not elsewhere classified: Secondary | ICD-10-CM

## 2016-11-27 DIAGNOSIS — R296 Repeated falls: Secondary | ICD-10-CM | POA: Diagnosis not present

## 2016-11-27 DIAGNOSIS — R262 Difficulty in walking, not elsewhere classified: Secondary | ICD-10-CM

## 2016-11-27 NOTE — Therapy (Signed)
[place], [place], [address] Phone: [phone]   Fax:  [phone]  Physical Therapy Treatment  Patient Details  Name: Joanna Reid MRN: [id number] Date of Birth: 1965-12-06 Referring Provider: Arther Abbott   Encounter Date: 11/27/2016      PT End of Session - 11/27/16 1729    Visit Number 3   Number of Visits 18   Date for PT Re-Evaluation 12/21/16   Authorization Type BCBS   Authorization - Visit Number 3   Authorization - Number of Visits 18   PT Start Time 9924   PT Stop Time 2683   PT Time Calculation (min) 30 min   Activity Tolerance Patient tolerated treatment well;No increased pain   Behavior During Therapy WFL for tasks assessed/performed      Past Medical History:  Diagnosis Date  . Acid reflux   . Allergic rhinitis   . Diabetes mellitus    type 2  . Gout   . Gout   . HBP (high blood pressure)   . Migraines     Past Surgical History:  Procedure Laterality Date  . CESAREAN SECTION    . EYE SURGERY    . HEMORRHOID SURGERY N/A 11/19/2012   Procedure: HEMORRHOIDECTOMY;  Surgeon: Jamesetta So, MD;  Location: [facility];  Service: General;  Laterality: N/A;  . kidney stones  1998  . LAPAROSCOPIC UNILATERAL SALPINGO OOPHERECTOMY  05/14/2012   Procedure: LAPAROSCOPIC UNILATERAL SALPINGO OOPHORECTOMY;  Surgeon: [name], MD;  Location: [facility];  Service: Gynecology;  Laterality: Right;  laparoscopic right salpingo-oophorectomy  . PARTIAL HYSTERECTOMY    . TONSILECTOMY, ADENOIDECTOMY, BILATERAL MYRINGOTOMY AND TUBES    . VESICOVAGINAL FISTULA CLOSURE W/ TAH      There were no vitals filed for this visit.      Subjective Assessment - 11/27/16 1715    Subjective Pt states she is having pain down her posterior calf and around her anterior/lateral Rt ankle.  comes today using 1 axillary crutch.  Reports pain 4/10.  States at night it is worse and her Rt foot goes numb                          [facility] Adult PT Treatment/Exercise - 11/27/16 0001      Knee/Hip Exercises: Standing   Heel Raises Both;1 set;15 reps   Knee Flexion Both;15 reps   Forward Lunges Both;15 reps   Hip Abduction Both;15 reps   Hip Extension Both;15 reps   Functional Squat 10 reps   Rocker Board Other (comment)   Rocker Board Limitations 10 reps A/P and R/L                  PT Short Term Goals - 11/23/16 0949      PT SHORT TERM GOAL #1   Title PT to be walking without crutches for at least 20 minutes at a time.    Time 3   Period Weeks   Status On-going     PT SHORT TERM GOAL #2   Title [name] flexion to be to 120 to allow pt to be able to sit in comfort for up to 30 minutes to enjoy eating a meal.    Time 3   Period Weeks   Status Revised     PT SHORT TERM GOAL #3   Title Pt to be able to stand in comfort for 30 minutes to  be able to make a meal.    Time 3   Period Weeks   Status On-going     PT SHORT TERM GOAL #4   Title Pt pain in her right knee to be no greater than a 4/10 to allow her to sleep throughout the night    Time 3   Period Weeks   Status On-going           PT Long Term Goals - 11/23/16 0951      PT LONG TERM GOAL #1   Title Pt ROM in her right knee to be to 125 to allow her to squat down in comfort.    Time 6   Period Weeks   Status Revised     PT LONG TERM GOAL #2   Title Pt strength in her right knee to be increased by one grade to allow pt to arise from a squatted position.    Time 6   Period Weeks   Status New     PT LONG TERM GOAL #3   Title Pt to be able to walk without crutches or knee immobilzer on for up to an hour in order to complete housework    Time 6   Period Weeks   Status New     PT LONG TERM GOAL #4   Title Pt right knee pain to be no greater than a 2/10 to allow her to return to work duties.    Time 6   Period Weeks   Status New               Plan - 11/27/16 1729     Clinical Impression Statement Pt requested to leave early to get to a soccer game.  Continued POC with addition of therex per PT's POC.  Manual and verbal cues required with all therex to isolate correct musculature.  pt with poor core stability as well. PT wtihout c/o pain completing any therex.  Instructed to complete mat exercises at home today as we were unable to complete due to time constraints.    Rehab Potential Good   PT Frequency 3x / week   PT Duration 6 weeks   PT Treatment/Interventions ADLs/Self Care Home Management;Cryotherapy;Therapeutic exercise;Therapeutic activities;Functional mobility training;Stair training;Gait training;Balance training;Patient/family education;Passive range of motion;Manual techniques   PT Next Visit Plan Progress CKC exercises with focus on core stability and LE weakness.     PT Home Exercise Plan qset, LAQ, SLR, bridge, hip abduction, prone knee flexion and hip extension    Consulted and Agree with Plan of Care Patient      Patient will benefit from skilled therapeutic intervention in order to improve the following deficits and impairments:  Abnormal gait, Decreased activity tolerance, Decreased range of motion, Decreased strength, Difficulty walking, Pain, Obesity, Decreased mobility  Visit Diagnosis: Stiffness of right knee, not elsewhere classified  Acute pain of right knee  Difficulty in walking, not elsewhere classified  Repeated falls     Problem List Patient Active Problem List   Diagnosis Date Noted  . Type 2 diabetes mellitus without complication, without long-term current use of insulin ([place]) 01/27/2016  . Morbid obesity due to excess calories ([place]) 01/27/2016  . Anxiety as acute reaction to exceptional stress 01/13/2015  . Attention deficit hyperactivity disorder (ADHD), 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  . Gout 01/20/2014  . GERD (gastroesophageal reflux disease) 01/20/2014  . Essential hypertension, benign 01/20/2014  . Plantar  fasciitis of left foot 09/19/2011    Londyn Hotard B  Remo Lipps [phone]  11/27/2016, 5:36 PM  [place] [address] [place], [place], [address] Phone: [phone]   Fax:  [phone]  Name: Joanna Reid MRN: [id number] Date of Birth: [birth year]

## 2016-11-28 ENCOUNTER — Ambulatory Visit (HOSPITAL_COMMUNITY): Payer: BLUE CROSS/BLUE SHIELD | Admitting: Physical Therapy

## 2016-11-28 DIAGNOSIS — R262 Difficulty in walking, not elsewhere classified: Secondary | ICD-10-CM | POA: Diagnosis not present

## 2016-11-28 DIAGNOSIS — R296 Repeated falls: Secondary | ICD-10-CM

## 2016-11-28 DIAGNOSIS — M25661 Stiffness of right knee, not elsewhere classified: Secondary | ICD-10-CM

## 2016-11-28 DIAGNOSIS — M25561 Pain in right knee: Secondary | ICD-10-CM | POA: Diagnosis not present

## 2016-11-28 NOTE — Therapy (Signed)
[facility] [address] [place], [place], [address] Phone: [phone]   Fax:  [phone]  Physical Therapy Treatment  Patient Details  Name: Joanna Reid MRN: [id number] Date of Birth: [birth year] Referring Provider: Arther Abbott   Encounter Date: 11/28/2016      PT End of Session - 11/28/16 1745    Visit Number 4   Number of Visits 18   Date for PT Re-Evaluation 12/21/16   Authorization Type BCBS   Authorization - Visit Number 4   Authorization - Number of Visits 18   PT Start Time 1640   PT Stop Time 3825   PT Time Calculation (min) 45 min   Activity Tolerance Patient tolerated treatment well;No increased pain   Behavior During Therapy WFL for tasks assessed/performed      Past Medical History:  Diagnosis Date  . Acid reflux   . Allergic rhinitis   . Diabetes mellitus    type 2  . Gout   . Gout   . HBP (high blood pressure)   . Migraines     Past Surgical History:  Procedure Laterality Date  . CESAREAN SECTION    . EYE SURGERY    . HEMORRHOID SURGERY N/A 11/19/2012   Procedure: HEMORRHOIDECTOMY;  Surgeon: Jamesetta So, MD;  Location: [facility];  Service: General;  Laterality: N/A;  . kidney stones  1998  . LAPAROSCOPIC UNILATERAL SALPINGO OOPHERECTOMY  05/14/2012   Procedure: LAPAROSCOPIC UNILATERAL SALPINGO OOPHORECTOMY;  Surgeon: [name], MD;  Location: [facility];  Service: Gynecology;  Laterality: Right;  laparoscopic right salpingo-oophorectomy  . PARTIAL HYSTERECTOMY    . TONSILECTOMY, ADENOIDECTOMY, BILATERAL MYRINGOTOMY AND TUBES    . VESICOVAGINAL FISTULA CLOSURE W/ TAH      There were no vitals filed for this visit.      Subjective Assessment - 11/28/16 1640    Subjective PT states it's a little better today because she's stayed off it.  Currently 2/10   Currently in Pain? Yes   Pain Score 2    Pain Location Leg   Pain Orientation Right;Left;Anterior;Posterior   Pain Descriptors /  Indicators Aching;Throbbing   Pain Type Acute pain                         [facility] Adult PT Treatment/Exercise - 11/28/16 0001      Knee/Hip Exercises: Standing   Heel Raises Both;1 set;15 reps   Heel Raises Limitations heel and toes   Knee Flexion Both;15 reps   Forward Lunges Both;15 reps   Forward Lunges Limitations to 4" step   Hip Abduction Both;15 reps   Hip Extension Both;15 reps   Functional Squat 15 reps   Rocker Board Other (comment)   Rocker Board Limitations 10 reps A/P and R/L     Knee/Hip Exercises: Seated   Sit to Sand 10 reps     Knee/Hip Exercises: Supine   Heel Slides Right;15 reps;AROM   Bridges 15 reps   Straight Leg Raises 1 set;15 reps     Knee/Hip Exercises: Prone   Hamstring Curl 15 reps   Hip Extension Right;15 reps;AROM                  PT Short Term Goals - 11/23/16 0949      PT SHORT TERM GOAL #1   Title PT to be walking without crutches for at least 20 minutes at a time.    Time 3  Period Weeks   Status On-going     PT SHORT TERM GOAL #2   Title [name] flexion to be to 120 to allow pt to be able to sit in comfort for up to 30 minutes to enjoy eating a meal.    Time 3   Period Weeks   Status Revised     PT SHORT TERM GOAL #3   Title Pt to be able to stand in comfort for 30 minutes to be able to make a meal.    Time 3   Period Weeks   Status On-going     PT SHORT TERM GOAL #4   Title Pt pain in her right knee to be no greater than a 4/10 to allow her to sleep throughout the night    Time 3   Period Weeks   Status On-going           PT Long Term Goals - 11/23/16 0951      PT LONG TERM GOAL #1   Title Pt ROM in her right knee to be to 125 to allow her to squat down in comfort.    Time 6   Period Weeks   Status Revised     PT LONG TERM GOAL #2   Title Pt strength in her right knee to be increased by one grade to allow pt to arise from a squatted position.    Time 6   Period Weeks    Status New     PT LONG TERM GOAL #3   Title Pt to be able to walk without crutches or knee immobilzer on for up to an hour in order to complete housework    Time 6   Period Weeks   Status New     PT LONG TERM GOAL #4   Title Pt right knee pain to be no greater than a 2/10 to allow her to return to work duties.    Time 6   Period Weeks   Status New               Plan - 11/28/16 1745    Clinical Impression Statement O[place]all improved today with ability to complete all therex without complaints or increased pain.  Improved stability noted today with rockerboard with less substitution.  PT required less verbal cues today with functional strengthening.     Rehab Potential Good   PT Frequency 3x / week   PT Duration 6 weeks   PT Treatment/Interventions ADLs/Self Care Home Management;Cryotherapy;Therapeutic exercise;Therapeutic activities;Functional mobility training;Stair training;Gait training;Balance training;Patient/family education;Passive range of motion;Manual techniques   PT Next Visit Plan Progress CKC exercises with focus on core stability and LE weakness.  Next session begin SLS and vector activity.    PT Home Exercise Plan qset, LAQ, SLR, bridge, hip abduction, prone knee flexion and hip extension    Consulted and Agree with Plan of Care Patient      Patient will benefit from skilled therapeutic intervention in order to improve the following deficits and impairments:  Abnormal gait, Decreased activity tolerance, Decreased range of motion, Decreased strength, Difficulty walking, Pain, Obesity, Decreased mobility  Visit Diagnosis: Stiffness of right knee, not elsewhere classified  Acute pain of right knee  Difficulty in walking, not elsewhere classified  Repeated falls     Problem List Patient Active Problem List   Diagnosis Date Noted  . Type 2 diabetes mellitus without complication, without long-term current use of insulin ([place]) 01/27/2016  . Morbid obesity  due to excess calories ([place]) 01/27/2016  . Anxiety as acute reaction to exceptional stress 01/13/2015  . Attention deficit hyperactivity disorder (ADHD), 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  . Gout 01/20/2014  . GERD (gastroesophageal reflux disease) 01/20/2014  . Essential hypertension, benign 01/20/2014  . Plantar fasciitis of left foot 09/19/2011    Teena Irani, PTA/CLT [phone]  11/28/2016, 5:47 PM  [facility] [place], [place], [address] Phone: [phone]   Fax:  [phone]  Name: Joanna Reid MRN: [id number] Date of Birth: Sep 22, 1965

## 2016-11-30 ENCOUNTER — Encounter (HOSPITAL_COMMUNITY): Payer: BLUE CROSS/BLUE SHIELD | Admitting: Physical Therapy

## 2016-12-05 ENCOUNTER — Ambulatory Visit (HOSPITAL_COMMUNITY): Payer: BLUE CROSS/BLUE SHIELD | Admitting: Physical Therapy

## 2016-12-05 DIAGNOSIS — M25561 Pain in right knee: Secondary | ICD-10-CM | POA: Diagnosis not present

## 2016-12-05 DIAGNOSIS — R262 Difficulty in walking, not elsewhere classified: Secondary | ICD-10-CM

## 2016-12-05 DIAGNOSIS — R296 Repeated falls: Secondary | ICD-10-CM

## 2016-12-05 DIAGNOSIS — M25661 Stiffness of right knee, not elsewhere classified: Secondary | ICD-10-CM | POA: Diagnosis not present

## 2016-12-05 NOTE — Therapy (Signed)
Joanna Reid, [place], [address] Phone: [phone]   Fax:  [phone]  Physical Therapy Treatment  Patient Details  Name: Joanna Reid MRN: [id number] Date of Birth: [birth year] Referring Provider: Arther Abbott   Encounter Date: 12/05/2016      PT End of Session - 12/05/16 1558    Visit Number 5   Number of Visits 18   Date for PT Re-Evaluation 12/21/16   Authorization Type BCBS   Authorization - Visit Number 5   Authorization - Number of Visits 18   PT Start Time 1520   PT Stop Time 1600   PT Time Calculation (min) 40 min   Activity Tolerance Patient tolerated treatment well;No increased pain   Behavior During Therapy WFL for tasks assessed/performed      Past Medical History:  Diagnosis Date  . Acid reflux   . Allergic rhinitis   . Diabetes mellitus    type 2  . Gout   . Gout   . HBP (high blood pressure)   . Migraines     Past Surgical History:  Procedure Laterality Date  . CESAREAN SECTION    . EYE SURGERY    . HEMORRHOID SURGERY N/A 11/19/2012   Procedure: HEMORRHOIDECTOMY;  Surgeon: Jamesetta So, MD;  Location: [facility];  Service: General;  Laterality: N/A;  . kidney stones  1998  . LAPAROSCOPIC UNILATERAL SALPINGO OOPHERECTOMY  05/14/2012   Procedure: LAPAROSCOPIC UNILATERAL SALPINGO OOPHORECTOMY;  Surgeon: [name], MD;  Location: [facility];  Service: Gynecology;  Laterality: Right;  laparoscopic right salpingo-oophorectomy  . PARTIAL HYSTERECTOMY    . TONSILECTOMY, ADENOIDECTOMY, BILATERAL MYRINGOTOMY AND TUBES    . VESICOVAGINAL FISTULA CLOSURE W/ TAH      There were no vitals filed for this visit.      Subjective Assessment - 12/05/16 1524    Subjective PT states she has not slept good in 2 nights. Reports pain in Rt medial knee and numbness laterally.  5/10 pain with shooting pain at times.     Currently in Pain? Yes                         [facility] Adult  PT Treatment/Exercise - 12/05/16 0001      Knee/Hip Exercises: Standing   Heel Raises Both;1 set;15 reps   Heel Raises Limitations heel and toes   Knee Flexion Both;15 reps   Forward Lunges Both;15 reps   Forward Lunges Limitations to 4" step   Hip Abduction Both;15 reps   Hip Extension Both;15 reps   Functional Squat 15 reps   Rocker Board Other (comment)   Rocker Board Limitations 10 reps A/P and R/L     Knee/Hip Exercises: Seated   Sit to Sand 10 reps     Knee/Hip Exercises: Supine   Heel Slides Right;15 reps;AROM   Bridges 15 reps   Straight Leg Raises 1 set;15 reps     Knee/Hip Exercises: Prone   Hamstring Curl 15 reps   Hip Extension Right;15 reps;AROM                  PT Short Term Goals - 11/23/16 0949      PT SHORT TERM GOAL #1   Title PT to be walking without crutches for at least 20 minutes at a time.    Time 3   Period Weeks   Status On-going     PT SHORT TERM  GOAL #2   Title [name] flexion to be to 120 to allow pt to be able to sit in comfort for up to 30 minutes to enjoy eating a meal.    Time 3   Period Weeks   Status Revised     PT SHORT TERM GOAL #3   Title Pt to be able to stand in comfort for 30 minutes to be able to make a meal.    Time 3   Period Weeks   Status On-going     PT SHORT TERM GOAL #4   Title Pt pain in her right knee to be no greater than a 4/10 to allow her to sleep throughout the night    Time 3   Period Weeks   Status On-going           PT Long Term Goals - 11/23/16 0951      PT LONG TERM GOAL #1   Title Pt ROM in her right knee to be to 125 to allow her to squat down in comfort.    Time 6   Period Weeks   Status Revised     PT LONG TERM GOAL #2   Title Pt strength in her right knee to be increased by one grade to allow pt to arise from a squatted position.    Time 6   Period Weeks   Status New     PT LONG TERM GOAL #3   Title Pt to be able to walk without crutches or knee immobilzer on  for up to an hour in order to complete housework    Time 6   Period Weeks   Status New     PT LONG TERM GOAL #4   Title Pt right knee pain to be no greater than a 2/10 to allow her to return to work duties.    Time 6   Period Weeks   Status New               Plan - 12/05/16 1602    Clinical Impression Statement Pt wtih increased discomfort this session.  Suggested she return to using crutch, however pt refuses as " I can't use it at work so I have to get used to it"  Pt able to complete all required therex with increase in pain and with noted antalgia.  No new exericses or increased reps added this session.    Rehab Potential Good   PT Frequency 3x / week   PT Duration 6 weeks   PT Treatment/Interventions ADLs/Self Care Home Management;Cryotherapy;Therapeutic exercise;Therapeutic activities;Functional mobility training;Stair training;Gait training;Balance training;Patient/family education;Passive range of motion;Manual techniques   PT Next Visit Plan Progress CKC exercises with focus on core stability and LE weakness.  Next session begin SLS and vector activity.    PT Home Exercise Plan qset, LAQ, SLR, bridge, hip abduction, prone knee flexion and hip extension    Consulted and Agree with Plan of Care Patient      Patient will benefit from skilled therapeutic intervention in order to improve the following deficits and impairments:  Abnormal gait, Decreased activity tolerance, Decreased range of motion, Decreased strength, Difficulty walking, Pain, Obesity, Decreased mobility  Visit Diagnosis: Stiffness of right knee, not elsewhere classified  Acute pain of right knee  Difficulty in walking, not elsewhere classified  Repeated falls     Problem List Patient Active Problem List   Diagnosis Date Noted  . Type 2 diabetes mellitus without complication, without long-term  current use of insulin ([place]) 01/27/2016  . Morbid obesity due to excess calories ([place]) 01/27/2016  .  Anxiety as acute reaction to exceptional stress 01/13/2015  . Attention deficit hyperactivity disorder (ADHD), 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  . Gout 01/20/2014  . GERD (gastroesophageal reflux disease) 01/20/2014  . Essential hypertension, benign 01/20/2014  . Plantar fasciitis of left foot 09/19/2011    Teena Irani, PTA/CLT [phone]  12/05/2016, 4:29 PM  [facility] [place], [place], [address] Phone: [phone]   Fax:  [phone]  Name: Joanna Reid MRN: [id number] Date of Birth: 1966/06/14

## 2016-12-06 ENCOUNTER — Ambulatory Visit (HOSPITAL_COMMUNITY): Payer: BLUE CROSS/BLUE SHIELD | Admitting: Physical Therapy

## 2016-12-06 DIAGNOSIS — M25661 Stiffness of right knee, not elsewhere classified: Secondary | ICD-10-CM | POA: Diagnosis not present

## 2016-12-06 DIAGNOSIS — R262 Difficulty in walking, not elsewhere classified: Secondary | ICD-10-CM

## 2016-12-06 DIAGNOSIS — M25561 Pain in right knee: Secondary | ICD-10-CM

## 2016-12-06 DIAGNOSIS — R296 Repeated falls: Secondary | ICD-10-CM | POA: Diagnosis not present

## 2016-12-06 NOTE — Therapy (Signed)
[facility] [address] [place], [place], [address] Phone: [phone]   Fax:  [phone]  Physical Therapy Treatment  Patient Details  Name: Joanna Reid MRN: [id number] Date of Birth: [birth year] Referring Provider: Arther Abbott   Encounter Date: 12/06/2016      PT End of Session - 12/06/16 1742    Visit Number 6   Number of Visits 18   Date for PT Re-Evaluation 12/14/16   Authorization Type BCBS   Authorization - Visit Number 6   Authorization - Number of Visits 18   PT Start Time 2694   PT Stop Time 8546   PT Time Calculation (min) 45 min   Activity Tolerance Patient tolerated treatment well;No increased pain   Behavior During Therapy WFL for tasks assessed/performed      Past Medical History:  Diagnosis Date  . Acid reflux   . Allergic rhinitis   . Diabetes mellitus    type 2  . Gout   . Gout   . HBP (high blood pressure)   . Migraines     Past Surgical History:  Procedure Laterality Date  . CESAREAN SECTION    . EYE SURGERY    . HEMORRHOID SURGERY N/A 11/19/2012   Procedure: HEMORRHOIDECTOMY;  Surgeon: Jamesetta So, MD;  Location: [facility];  Service: General;  Laterality: N/A;  . kidney stones  1998  . LAPAROSCOPIC UNILATERAL SALPINGO OOPHERECTOMY  05/14/2012   Procedure: LAPAROSCOPIC UNILATERAL SALPINGO OOPHORECTOMY;  Surgeon: [name], MD;  Location: [facility];  Service: Gynecology;  Laterality: Right;  laparoscopic right salpingo-oophorectomy  . PARTIAL HYSTERECTOMY    . TONSILECTOMY, ADENOIDECTOMY, BILATERAL MYRINGOTOMY AND TUBES    . VESICOVAGINAL FISTULA CLOSURE W/ TAH      There were no vitals filed for this visit.      Subjective Assessment - 12/06/16 1646    Subjective Pt states she's better today at 2/10.  States her Rt knee "pops" when going sit to stand.  States she has been off it more.                           [facility] Adult PT Treatment/Exercise - 12/06/16 0001       Knee/Hip Exercises: Standing   Heel Raises Both;1 set;15 reps   Heel Raises Limitations heel and toes   Knee Flexion Both;15 reps   Forward Lunges Both;15 reps   Forward Lunges Limitations to 4" step   Hip Abduction 20 reps   Hip Extension 20 reps   Functional Squat 15 reps   Stairs 1 RT step to with HR   Rocker Board Other (comment)   Rocker Board Limitations 15 reps A/P and R/L   SLS 3 trials max 4" Rt, 10" Lt   SLS with Vectors 5X5" each with 1 UE     Knee/Hip Exercises: Seated   Sit to Sand 15 reps                  PT Short Term Goals - 11/23/16 0949      PT SHORT TERM GOAL #1   Title PT to be walking without crutches for at least 20 minutes at a time.    Time 3   Period Weeks   Status On-going     PT SHORT TERM GOAL #2   Title [name] flexion to be to 120 to allow pt to be able to sit in comfort  for up to 30 minutes to enjoy eating a meal.    Time 3   Period Weeks   Status Revised     PT SHORT TERM GOAL #3   Title Pt to be able to stand in comfort for 30 minutes to be able to make a meal.    Time 3   Period Weeks   Status On-going     PT SHORT TERM GOAL #4   Title Pt pain in her right knee to be no greater than a 4/10 to allow her to sleep throughout the night    Time 3   Period Weeks   Status On-going           PT Long Term Goals - 11/23/16 0951      PT LONG TERM GOAL #1   Title Pt ROM in her right knee to be to 125 to allow her to squat down in comfort.    Time 6   Period Weeks   Status Revised     PT LONG TERM GOAL #2   Title Pt strength in her right knee to be increased by one grade to allow pt to arise from a squatted position.    Time 6   Period Weeks   Status New     PT LONG TERM GOAL #3   Title Pt to be able to walk without crutches or knee immobilzer on for up to an hour in order to complete housework    Time 6   Period Weeks   Status New     PT LONG TERM GOAL #4   Title Pt right knee pain to be no greater  than a 2/10 to allow her to return to work duties.    Time 6   Period Weeks   Status New               Plan - 12/06/16 1737    Clinical Impression Statement Continued focus on functional strengthening and stability of Rt LE.  Pt is concerned with RTW on Monday due to continued pain, antalgia and inability to negotiate stairs reciprocally and without pain and circumduction of Rt LE.  Added SLS this session with max of 5 second hold with Rt LE.  Vector stances added to help increase this.  Pt also able to increase reps of some of her therex.    Rehab Potential Good   PT Frequency 3x / week   PT Duration 6 weeks   PT Treatment/Interventions ADLs/Self Care Home Management;Cryotherapy;Therapeutic exercise;Therapeutic activities;Functional mobility training;Stair training;Gait training;Balance training;Patient/family education;Passive range of motion;Manual techniques   PT Next Visit Plan Progress CKC exercises with focus on core stability and LE weakness.  Re-evaluate next session for preparation of RTW on Monday.    PT Home Exercise Plan qset, LAQ, SLR, bridge, hip abduction, prone knee flexion and hip extension    Consulted and Agree with Plan of Care Patient      Patient will benefit from skilled therapeutic intervention in order to improve the following deficits and impairments:  Abnormal gait, Decreased activity tolerance, Decreased range of motion, Decreased strength, Difficulty walking, Pain, Obesity, Decreased mobility  Visit Diagnosis: Stiffness of right knee, not elsewhere classified  Acute pain of right knee  Difficulty in walking, not elsewhere classified  Repeated falls     Problem List Patient Active Problem List   Diagnosis Date Noted  . Type 2 diabetes mellitus without complication, without long-term current use of insulin ([facility]) 01/27/2016  .  Morbid obesity due to excess calories ([place] [facility]) 01/27/2016  . Anxiety as acute reaction to exceptional stress 01/13/2015  .  Attention deficit hyperactivity disorder (ADHD), 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  . Gout 01/20/2014  . GERD (gastroesophageal reflux disease) 01/20/2014  . Essential hypertension, benign 01/20/2014  . Plantar fasciitis of left foot 09/19/2011    Teena Irani, PTA/CLT [phone]  12/06/2016, 5:45 PM  [facility] [place], [place], [address] Phone: [phone]   Fax:  [phone]  Name: Joanna Reid MRN: [id number] Date of Birth: 08/02/1965

## 2016-12-07 ENCOUNTER — Ambulatory Visit (HOSPITAL_COMMUNITY): Payer: BLUE CROSS/BLUE SHIELD | Attending: Orthopedic Surgery | Admitting: Physical Therapy

## 2016-12-07 DIAGNOSIS — R262 Difficulty in walking, not elsewhere classified: Secondary | ICD-10-CM | POA: Insufficient documentation

## 2016-12-07 DIAGNOSIS — M25561 Pain in right knee: Secondary | ICD-10-CM | POA: Insufficient documentation

## 2016-12-07 DIAGNOSIS — M25661 Stiffness of right knee, not elsewhere classified: Secondary | ICD-10-CM | POA: Insufficient documentation

## 2016-12-07 DIAGNOSIS — R296 Repeated falls: Secondary | ICD-10-CM | POA: Diagnosis not present

## 2016-12-07 NOTE — Therapy (Signed)
[place], [place], [address] Phone: [phone]   Fax:  [phone]  Physical Therapy Treatment  Patient Details  Name: Joanna Reid MRN: [id number] Date of Birth: 1965/08/08 Referring Provider: Arther Abbott   Encounter Date: 12/07/2016      PT End of Session - 12/07/16 1505    Visit Number 7   Number of Visits 18   Date for PT Re-Evaluation 12/14/16   Authorization Type BCBS   Authorization - Visit Number 7   Authorization - Number of Visits 18   PT Start Time 7619   PT Stop Time 1520   PT Time Calculation (min) 48 min   Activity Tolerance Patient tolerated treatment well;No increased pain   Behavior During Therapy WFL for tasks assessed/performed      Past Medical History:  Diagnosis Date  . Acid reflux   . Allergic rhinitis   . Diabetes mellitus    type 2  . Gout   . Gout   . HBP (high blood pressure)   . Migraines     Past Surgical History:  Procedure Laterality Date  . CESAREAN SECTION    . EYE SURGERY    . HEMORRHOID SURGERY N/A 11/19/2012   Procedure: HEMORRHOIDECTOMY;  Surgeon: Jamesetta So, MD;  Location: [facility];  Service: General;  Laterality: N/A;  . kidney stones  1998  . LAPAROSCOPIC UNILATERAL SALPINGO OOPHERECTOMY  05/14/2012   Procedure: LAPAROSCOPIC UNILATERAL SALPINGO OOPHORECTOMY;  Surgeon: [name], MD;  Location: [facility];  Service: Gynecology;  Laterality: Right;  laparoscopic right salpingo-oophorectomy  . PARTIAL HYSTERECTOMY    . TONSILECTOMY, ADENOIDECTOMY, BILATERAL MYRINGOTOMY AND TUBES    . VESICOVAGINAL FISTULA CLOSURE W/ TAH      There were no vitals filed for this visit.      Subjective Assessment - 12/07/16 1430    Subjective Pt states that she is no longer using her immobilizer or using her crutches.  She is also driving.    Pertinent History HTN, DM    How long can you sit comfortably? Able to sit for 15 minutes in comfort was 15 minutes.     How long can you stand comfortably? Able to stand for 30 minutes was 15 minutes    How long can you walk comfortably? Walking without an immobilizer or crutch for ten minutes.  Was walking with an immobilizer with crutches for 10 minutes    Patient Stated Goals walk normal again, decrease pain, work    Currently in Pain? Yes   Pain Score 2    Pain Location Knee   Pain Orientation Right   Pain Onset 1 to 4 weeks ago   Aggravating Factors  activity    Pain Relieving Factors ice    Effect of Pain on Daily Activities aggrevates             [facility] PT Assessment - 12/07/16 0001      Assessment   Medical Diagnosis Rt sciatica/Rt knee pain   Referring Provider Arther Abbott    Onset Date/Surgical Date 10/27/16   Next MD Visit unknown   Prior Therapy none     Precautions   Precautions None;Fall     Restrictions   Weight Bearing Restrictions Yes     Balance Screen   Has the patient fallen in the past 6 months Yes   How many times? 2   Has the patient had a decrease in activity level because of  a fear of falling?  Yes   Is the patient reluctant to leave their home because of a fear of falling?  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[employer], walking      Cognition   Overall Cognitive Status Within Functional Limits for tasks assessed     Observation/Other Assessments   Focus on Therapeutic Outcomes (FOTO)  30     Functional Tests   Functional tests Sit to Stand     Sit to Stand   Comments unable      AROM   Right Knee Extension 0   Right Knee Flexion 110  was 65     Strength   Right Hip Flexion 3+/5  was 3-   Right Hip Extension 4-/5  was 3-/5    Right Hip ABduction 5/5  was 4-/5    Right Knee Flexion 4/5  was 4-/5    Right Knee Extension 4-/5  was 3/5    Right Ankle  Dorsiflexion 5/5                     [facility] Adult PT Treatment/Exercise - 12/07/16 0001      Ambulation/Gait   Ambulation/Gait --   Ambulation/Gait Assistance --   Assistive device --  knee immobilizer    Gait Pattern --   Gait Comments --     Knee/Hip Exercises: Aerobic   Nustep --     Knee/Hip Exercises: Standing   Heel Raises Both;1 set;15 reps   Forward Lunges Both;15 reps   Forward Step Up Right;Hand Hold: 0;Step Height: 6"   Functional Squat 15 reps   Stairs 2 RT step to with HR  reciprocal    Rocker Board 2 minutes   SLS 3 trials   Other Standing Knee Exercises side lying side step with green t-band      Knee/Hip Exercises: Seated   Sit to Sand 15 reps     Knee/Hip Exercises: Prone   Hip Extension Right;15 reps;AROM                  PT Short Term Goals - 11/23/16 0949      PT SHORT TERM GOAL #1   Title PT to be walking without crutches for at least 20 minutes at a time.    Time 3   Period Weeks   Status On-going     PT SHORT TERM GOAL #2   Title [name] flexion to be to 120 to allow pt to be able to sit in comfort for up to 30 minutes to enjoy eating a meal.    Time 3   Period Weeks   Status Revised     PT SHORT TERM GOAL #3   Title Pt to be able to stand in comfort for 30 minutes to be able to make a meal.    Time 3   Period Weeks   Status On-going     PT SHORT TERM GOAL #4   Title Pt pain in her right knee to be no greater than a 4/10 to allow her to sleep throughout the night    Time 3   Period Weeks   Status On-going           PT Long Term  Goals - 11/23/16 0951      PT LONG TERM GOAL #1   Title Pt ROM in her right knee to be to 125 to allow her to squat down in comfort.    Time 6   Period Weeks   Status Revised     PT LONG TERM GOAL #2   Title Pt strength in her right knee to be increased by one grade to allow pt to arise from a squatted position.    Time 6   Period Weeks   Status New     PT LONG  TERM GOAL #3   Title Pt to be able to walk without crutches or knee immobilzer on for up to an hour in order to complete housework    Time 6   Period Weeks   Status New     PT LONG TERM GOAL #4   Title Pt right knee pain to be no greater than a 2/10 to allow her to return to work duties.    Time 6   Period Weeks   Status New               Plan - 12/07/16 1506    Clinical Impression Statement Pt reassessed with significant improvement in ROM and improvement but not normal strength.  Pain is decreasing as well.  Focused on doing standing activity without UE assist as pt will need to be carrying boxes at work.    Rehab Potential Good   PT Frequency 3x / week   PT Duration 6 weeks   PT Treatment/Interventions ADLs/Self Care Home Management;Cryotherapy;Therapeutic exercise;Therapeutic activities;Functional mobility training;Stair training;Gait training;Balance training;Patient/family education;Passive range of motion;Manual techniques   PT Next Visit Plan Continue to progress functional activity work on carrying items up and down steps    PT Home Exercise Plan qset, LAQ, SLR, bridge, hip abduction, prone knee flexion and hip extension    Consulted and Agree with Plan of Care Patient      Patient will benefit from skilled therapeutic intervention in order to improve the following deficits and impairments:  Abnormal gait, Decreased activity tolerance, Decreased range of motion, Decreased strength, Difficulty walking, Pain, Obesity, Decreased mobility  Visit Diagnosis: Stiffness of right knee, not elsewhere classified  Acute pain of right knee  Difficulty in walking, not elsewhere classified     Problem List Patient Active Problem List   Diagnosis Date Noted  . Type 2 diabetes mellitus without complication, without long-term current use of insulin ([facility]) 01/27/2016  . Morbid obesity due to excess calories ([place]) 01/27/2016  . Anxiety as acute reaction to exceptional stress  01/13/2015  . Attention deficit hyperactivity disorder (ADHD), 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  . Gout 01/20/2014  . GERD (gastroesophageal reflux disease) 01/20/2014  . Essential hypertension, benign 01/20/2014  . Plantar fasciitis of left foot 09/19/2011    Rayetta Humphrey, PT CLT [phone] 12/07/2016, 3:13 PM  [place], [place], [address] Phone: [phone]   Fax:  [phone]  Name: Joanna Reid MRN: [id number] Date of Birth: [birth year]

## 2016-12-09 ENCOUNTER — Other Ambulatory Visit: Payer: Self-pay | Admitting: Family Medicine

## 2016-12-10 ENCOUNTER — Other Ambulatory Visit: Payer: Self-pay | Admitting: Family Medicine

## 2016-12-10 ENCOUNTER — Ambulatory Visit (HOSPITAL_COMMUNITY): Payer: BLUE CROSS/BLUE SHIELD | Admitting: Physical Therapy

## 2016-12-12 ENCOUNTER — Ambulatory Visit (HOSPITAL_COMMUNITY): Payer: BLUE CROSS/BLUE SHIELD | Admitting: Physical Therapy

## 2016-12-12 DIAGNOSIS — R262 Difficulty in walking, not elsewhere classified: Secondary | ICD-10-CM | POA: Diagnosis not present

## 2016-12-12 DIAGNOSIS — M25661 Stiffness of right knee, not elsewhere classified: Secondary | ICD-10-CM | POA: Diagnosis not present

## 2016-12-12 DIAGNOSIS — M25561 Pain in right knee: Secondary | ICD-10-CM

## 2016-12-12 DIAGNOSIS — R296 Repeated falls: Secondary | ICD-10-CM

## 2016-12-12 NOTE — Therapy (Addendum)
[place], [place], [address] Phone: [phone]   Fax:  [phone]  Physical Therapy Treatment  Patient Details  Name: Joanna Reid MRN: [id number] Date of Birth: 05/18/1966 Referring Provider: Arther Abbott   Encounter Date: 12/12/2016      PT End of Session - 12/12/16 1527    Visit Number 8   Number of Visits 18   Date for PT Re-Evaluation 12/14/16   Authorization Type BCBS   Authorization - Visit Number 8   Authorization - Number of Visits 18   PT Start Time 1520   PT Stop Time 1600   PT Time Calculation (min) 40 min   Activity Tolerance Patient tolerated treatment well;No increased pain   Behavior During Therapy WFL for tasks assessed/performed      Past Medical History:  Diagnosis Date  . Acid reflux   . Allergic rhinitis   . Diabetes mellitus    type 2  . Gout   . Gout   . HBP (high blood pressure)   . Migraines     Past Surgical History:  Procedure Laterality Date  . CESAREAN SECTION    . EYE SURGERY    . HEMORRHOID SURGERY N/A 11/19/2012   Procedure: HEMORRHOIDECTOMY;  Surgeon: Jamesetta So, MD;  Location: [facility];  Service: General;  Laterality: N/A;  . kidney stones  1998  . LAPAROSCOPIC UNILATERAL SALPINGO OOPHERECTOMY  05/14/2012   Procedure: LAPAROSCOPIC UNILATERAL SALPINGO OOPHORECTOMY;  Surgeon: [name], MD;  Location: [facility];  Service: Gynecology;  Laterality: Right;  laparoscopic right salpingo-oophorectomy  . PARTIAL HYSTERECTOMY    . TONSILECTOMY, ADENOIDECTOMY, BILATERAL MYRINGOTOMY AND TUBES    . VESICOVAGINAL FISTULA CLOSURE W/ TAH      There were no vitals filed for this visit.      Subjective Assessment - 12/12/16 1522    Subjective Pt has been working since Monday.  She is not lifting or going up and down steps at this time she is just doing paper work but she needs to walk quite a bit.     Pertinent History HTN, DM    How long can you sit comfortably?  Able to sit for 15 minutes in comfort was 15 minutes.     How long can you stand comfortably? Able to stand for 30 minutes was 15 minutes    How long can you walk comfortably? Walking without an immobilizer or crutch for ten minutes.  Was walking with an immobilizer with crutches for 10 minutes    Patient Stated Goals walk normal again, decrease pain, work    Currently in Pain? Yes   Pain Score 5    Pain Location Knee   Pain Orientation Right;Posterior   Pain Descriptors / Indicators Aching   Pain Onset 1 to 4 weeks ago   Pain Frequency Intermittent   Aggravating Factors  activity    Pain Relieving Factors ice                          [facility] Adult PT Treatment/Exercise - 12/12/16 0001      Knee/Hip Exercises: Stretches   Active Hamstring Stretch Right;3 reps;30 seconds   Knee: Self-Stretch to increase Flexion Right;3 reps;20 seconds   Other Knee/Hip Stretches slant board 3 x 30"     Knee/Hip Exercises: Aerobic   Nustep hills 3; level 2 x 7:00  no charge      Knee/Hip Exercises:  Machines for Strengthening   Cybex Knee Extension 3PL x 10   Cybex Knee Flexion 4 Pl x 10    Cybex Leg Press 6 PL x 10      Knee/Hip Exercises: Standing   Heel Raises 15 reps   Functional Squat 15 reps   Stairs 3 RT step to with HR  reciprocal no UE assist    SLS with Vectors 3X10" each with 1 UE                  PT Short Term Goals - 11/23/16 0949      PT SHORT TERM GOAL #1   Title PT to be walking without crutches for at least 20 minutes at a time.    Time 3   Period Weeks   Status On-going     PT SHORT TERM GOAL #2   Title [name] flexion to be to 120 to allow pt to be able to sit in comfort for up to 30 minutes to enjoy eating a meal.    Time 3   Period Weeks   Status Revised     PT SHORT TERM GOAL #3   Title Pt to be able to stand in comfort for 30 minutes to be able to make a meal.    Time 3   Period Weeks   Status On-going     PT SHORT TERM  GOAL #4   Title Pt pain in her right knee to be no greater than a 4/10 to allow her to sleep throughout the night    Time 3   Period Weeks   Status On-going           PT Long Term Goals - 11/23/16 0951      PT LONG TERM GOAL #1   Title Pt ROM in her right knee to be to 125 to allow her to squat down in comfort.    Time 6   Period Weeks   Status Revised     PT LONG TERM GOAL #2   Title Pt strength in her right knee to be increased by one grade to allow pt to arise from a squatted position.    Time 6   Period Weeks   Status New     PT LONG TERM GOAL #3   Title Pt to be able to walk without crutches or knee immobilzer on for up to an hour in order to complete housework    Time 6   Period Weeks   Status New     PT LONG TERM GOAL #4   Title Pt right knee pain to be no greater than a 2/10 to allow her to return to work duties.    Time 6   Period Weeks   Status New               Plan - 12/12/16 1552    Clinical Impression Statement Pt back to work but is doing light duty.  Therapist had pt go up and down steps without UE assist in preparation to go up and down with boxes but this was unsafe; completed steps with one hand assist..  Added cybex strengthening to program.    Rehab Potential Good   PT Frequency 3x / week   PT Duration 6 weeks   PT Treatment/Interventions ADLs/Self Care Home Management;Cryotherapy;Therapeutic exercise;Therapeutic activities;Functional mobility training;Stair training;Gait training;Balance training;Patient/family education;Passive range of motion;Manual techniques   PT Next Visit Plan Continue to progress functional activity  work on carrying items up and down steps    PT Home Exercise Plan qset, LAQ, SLR, bridge, hip abduction, prone knee flexion and hip extension    Consulted and Agree with Plan of Care Patient      Patient will benefit from skilled therapeutic intervention in order to improve the following deficits and impairments:   Abnormal gait, Decreased activity tolerance, Decreased range of motion, Decreased strength, Difficulty walking, Pain, Obesity, Decreased mobility  Visit Diagnosis: Stiffness of right knee, not elsewhere classified  Acute pain of right knee  Difficulty in walking, not elsewhere classified  Repeated falls     Problem List Patient Active Problem List   Diagnosis Date Noted  . Type 2 diabetes mellitus without complication, without long-term current use of insulin ([place]) 01/27/2016  . Morbid obesity due to excess calories ([place]) 01/27/2016  . Anxiety as acute reaction to exceptional stress 01/13/2015  . Attention deficit hyperactivity disorder (ADHD), 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  . Gout 01/20/2014  . GERD (gastroesophageal reflux disease) 01/20/2014  . Essential hypertension, benign 01/20/2014  . Plantar fasciitis of left foot 09/19/2011    Rayetta Humphrey, PT CLT [phone] 12/12/2016, 4:10 PM  [place], [place], [address] Phone: [phone]   Fax:  [phone]  Name: SHERRINA ZAUGG MRN: [id number] Date of Birth: 02-17-1966

## 2016-12-14 ENCOUNTER — Ambulatory Visit (HOSPITAL_COMMUNITY): Payer: BLUE CROSS/BLUE SHIELD | Admitting: Physical Therapy

## 2016-12-14 DIAGNOSIS — R296 Repeated falls: Secondary | ICD-10-CM | POA: Diagnosis not present

## 2016-12-14 DIAGNOSIS — M25661 Stiffness of right knee, not elsewhere classified: Secondary | ICD-10-CM | POA: Diagnosis not present

## 2016-12-14 DIAGNOSIS — M25561 Pain in right knee: Secondary | ICD-10-CM | POA: Diagnosis not present

## 2016-12-14 DIAGNOSIS — R262 Difficulty in walking, not elsewhere classified: Secondary | ICD-10-CM | POA: Diagnosis not present

## 2016-12-14 NOTE — Therapy (Addendum)
[place] [address] [place], [place], [address] Phone: [phone]   Fax:  [phone]  Physical Therapy Treatment  Patient Details  Name: Joanna Reid MRN: [id number] Date of Birth: June 24, 1966 Referring Provider: Arther Abbott   Encounter Date: 12/14/2016      PT End of Session - 12/14/16 1552    Visit Number 9   Number of Visits 18   Date for PT Re-Evaluation 12/14/16   Authorization Type BCBS   Authorization - Visit Number 9   Authorization - Number of Visits 18   PT Start Time 7124   PT Stop Time 5809   PT Time Calculation (min) 48 min   Activity Tolerance Patient tolerated treatment well;No increased pain   Behavior During Therapy WFL for tasks assessed/performed      Past Medical History:  Diagnosis Date  . Acid reflux   . Allergic rhinitis   . Diabetes mellitus    type 2  . Gout   . Gout   . HBP (high blood pressure)   . Migraines     Past Surgical History:  Procedure Laterality Date  . CESAREAN SECTION    . EYE SURGERY    . HEMORRHOID SURGERY N/A 11/19/2012   Procedure: HEMORRHOIDECTOMY;  Surgeon: Jamesetta So, MD;  Location: [facility];  Service: General;  Laterality: N/A;  . kidney stones  1998  . LAPAROSCOPIC UNILATERAL SALPINGO OOPHERECTOMY  05/14/2012   Procedure: LAPAROSCOPIC UNILATERAL SALPINGO OOPHORECTOMY;  Surgeon: [name], MD;  Location: [facility];  Service: Gynecology;  Laterality: Right;  laparoscopic right salpingo-oophorectomy  . PARTIAL HYSTERECTOMY    . TONSILECTOMY, ADENOIDECTOMY, BILATERAL MYRINGOTOMY AND TUBES    . VESICOVAGINAL FISTULA CLOSURE W/ TAH      There were no vitals filed for this visit.      Subjective Assessment - 12/14/16 1540    Subjective Pt is still on light duty at work.  She is not lifting or carrying objects.    Pertinent History HTN, DM    How long can you sit comfortably? Able to sit for 15 minutes in comfort was 15 minutes.     How long can you stand  comfortably? Able to stand for 30 minutes was 15 minutes    How long can you walk comfortably? Walking without an immobilizer or crutch for ten minutes.  Was walking with an immobilizer with crutches for 10 minutes    Patient Stated Goals walk normal again, decrease pain, work    Currently in Pain? Yes   Pain Score 7    Pain Orientation Right   Pain Descriptors / Indicators Aching;Tender;Sore   Pain Onset 1 to 4 weeks ago   Aggravating Factors  walking    Pain Relieving Factors ice    Effect of Pain on Daily Activities increases                          [facility] Adult PT Treatment/Exercise - 12/14/16 0001      Knee/Hip Exercises: Stretches   Active Hamstring Stretch Right;3 reps;30 seconds   Knee: Self-Stretch to increase Flexion Right;3 reps;20 seconds   Other Knee/Hip Stretches slant board 3 x 30"     Knee/Hip Exercises: Aerobic   Nustep hills 3; level 3 x 10:00  no charge      Knee/Hip Exercises: Machines for Strengthening   Cybex Knee Extension 3PL x 15   Cybex Knee Flexion 4 Pl x  15   Cybex Leg Press 6 PL x 15     Knee/Hip Exercises: Standing   Heel Raises 15 reps   Heel Raises Limitations with squats taking ball up and to 12" step    Forward Lunges Both;15 reps   Functional Squat 15 reps   Stairs 3 RT step to with HR  reciprocal no UE assist    SLS with Vectors 3X10" each with 1 finger      Knee/Hip Exercises: Seated   Sit to Sand 15 reps                  PT Short Term Goals - 12/14/16 1559      PT SHORT TERM GOAL #1   Title PT to be walking without crutches for at least 20 minutes at a time.    Time 3   Period Weeks   Status MET      PT SHORT TERM GOAL #2   Title [name] flexion to be to 120 to allow pt to be able to sit in comfort for up to 30 minutes to enjoy eating a meal.    Time 3   Period Weeks   Status On-going      PT SHORT TERM GOAL #3   Title Pt to be able to stand in comfort for 30 minutes to be able to make  a meal.    Time 3   Period Weeks   Status Met     PT SHORT TERM GOAL #4   Title Pt pain in her right knee to be no greater than a 4/10 to allow her to sleep throughout the night    Time 3   Period Weeks   Status On-going           PT Long Term Goals - 12/14/16 1559      PT LONG TERM GOAL #1   Title Pt ROM in her right knee to be to 125 to allow her to squat down in comfort.    Time 6   Period Weeks   Status On-going      PT LONG TERM GOAL #2   Title Pt strength in her right knee to be increased by one grade to allow pt to arise from a squatted position.    Time 6   Period Weeks   Status Unknown      PT LONG TERM GOAL #3   Title Pt to be able to walk without crutches or knee immobilzer on for up to an hour in order to complete housework    Time 6   Period Weeks   Status Met      PT LONG TERM GOAL #4   Title Pt right knee pain to be no greater than a 2/10 to allow her to return to work duties.    Time 6   Period Weeks   Status On-going                Plan - 12/14/16 1556    Clinical Impression Statement Pt form improving with stairclimbing and functional squats although pain level remains high.  Unable to carry box up and down steps yet as pt needs one hand hold for safety.  Added reps for strengthening and activity tolerance.    Rehab Potential Good   PT Frequency 3x / week   PT Duration 6 weeks   PT Treatment/Interventions ADLs/Self Care Home Management;Cryotherapy;Therapeutic exercise;Therapeutic activities;Functional mobility training;Stair training;Gait training;Balance training;Patient/family education;Passive range  of motion;Manual techniques   PT Next Visit Plan Continue to progress functional activity work on carrying items up and down steps once pt is safe walking up and down steps with no hand hold.    PT Home Exercise Plan qset, LAQ, SLR, bridge, hip abduction, prone knee flexion and hip extension    Consulted and Agree with Plan of Care Patient       Patient will benefit from skilled therapeutic intervention in order to improve the following deficits and impairments:  Abnormal gait, Decreased activity tolerance, Decreased range of motion, Decreased strength, Difficulty walking, Pain, Obesity, Decreased mobility  Visit Diagnosis: Stiffness of right knee, not elsewhere classified  Acute pain of right knee  Difficulty in walking, not elsewhere classified  Repeated falls     Problem List Patient Active Problem List   Diagnosis Date Noted  . Type 2 diabetes mellitus without complication, without long-term current use of insulin ([employer]) 01/27/2016  . Morbid obesity due to excess calories ([place]) 01/27/2016  . Anxiety as acute reaction to exceptional stress 01/13/2015  . Attention deficit hyperactivity disorder (ADHD), 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  . Gout 01/20/2014  . GERD (gastroesophageal reflux disease) 01/20/2014  . Essential hypertension, benign 01/20/2014  . Plantar fasciitis of left foot 09/19/2011    Rayetta Humphrey, PT CLT [phone] 12/14/2016, 4:04 PM  [place], [place], [address] Phone: [phone]   Fax:  [phone]  Name: Joanna Reid MRN: [id number] Date of Birth: 08/10/1965    12/23/2016  PHYSICAL THERAPY DISCHARGE SUMMARY  Visits from Start of Care: 9 Current functional level related to goals / functional outcomes: See above    Remaining deficits: Pt called she fell again and is going for a second opinion    Education / Equipment: HEP Plan: Patient agrees to discharge.  Patient goals were partially met. Patient is being discharged due to the patient's request.  ?????       Rayetta Humphrey, [place] CLT [phone]

## 2016-12-17 ENCOUNTER — Ambulatory Visit (HOSPITAL_COMMUNITY): Payer: BLUE CROSS/BLUE SHIELD | Admitting: Physical Therapy

## 2016-12-17 DIAGNOSIS — N390 Urinary tract infection, site not specified: Secondary | ICD-10-CM | POA: Diagnosis not present

## 2016-12-17 DIAGNOSIS — N200 Calculus of kidney: Secondary | ICD-10-CM | POA: Diagnosis not present

## 2016-12-17 DIAGNOSIS — B9620 Unspecified Escherichia coli [E. coli] as the cause of diseases classified elsewhere: Secondary | ICD-10-CM | POA: Diagnosis not present

## 2016-12-17 DIAGNOSIS — N3021 Other chronic cystitis with hematuria: Secondary | ICD-10-CM | POA: Diagnosis not present

## 2016-12-17 DIAGNOSIS — Z029 Encounter for administrative examinations, unspecified: Secondary | ICD-10-CM

## 2016-12-18 DIAGNOSIS — B009 Herpesviral infection, unspecified: Secondary | ICD-10-CM | POA: Diagnosis not present

## 2016-12-18 DIAGNOSIS — Z6841 Body Mass Index (BMI) 40.0 and over, adult: Secondary | ICD-10-CM | POA: Diagnosis not present

## 2016-12-18 DIAGNOSIS — Z713 Dietary counseling and surveillance: Secondary | ICD-10-CM | POA: Diagnosis not present

## 2016-12-18 DIAGNOSIS — E669 Obesity, unspecified: Secondary | ICD-10-CM | POA: Diagnosis not present

## 2016-12-19 ENCOUNTER — Telehealth (HOSPITAL_COMMUNITY): Payer: Self-pay | Admitting: Family Medicine

## 2016-12-19 ENCOUNTER — Ambulatory Visit (HOSPITAL_COMMUNITY): Payer: BLUE CROSS/BLUE SHIELD | Admitting: Physical Therapy

## 2016-12-19 DIAGNOSIS — Z1231 Encounter for screening mammogram for malignant neoplasm of breast: Secondary | ICD-10-CM | POA: Diagnosis not present

## 2016-12-19 NOTE — Telephone Encounter (Signed)
12/19/16 pt left a message that her leg was really swollen and she was going to her dr. And to just cx this weeks' appts.

## 2016-12-21 ENCOUNTER — Ambulatory Visit (HOSPITAL_COMMUNITY): Payer: BLUE CROSS/BLUE SHIELD | Admitting: Physical Therapy

## 2016-12-24 ENCOUNTER — Telehealth (HOSPITAL_COMMUNITY): Payer: Self-pay | Admitting: Family Medicine

## 2016-12-24 ENCOUNTER — Ambulatory Visit (HOSPITAL_COMMUNITY): Payer: BLUE CROSS/BLUE SHIELD | Admitting: Physical Therapy

## 2016-12-24 NOTE — Telephone Encounter (Signed)
12/24/16 pt called and cancelled all appts.  She said that she has fallen again and is going to another dr.

## 2016-12-25 DIAGNOSIS — M1711 Unilateral primary osteoarthritis, right knee: Secondary | ICD-10-CM | POA: Diagnosis not present

## 2016-12-26 ENCOUNTER — Encounter (HOSPITAL_COMMUNITY): Payer: BLUE CROSS/BLUE SHIELD

## 2016-12-28 ENCOUNTER — Encounter (HOSPITAL_COMMUNITY): Payer: BLUE CROSS/BLUE SHIELD | Admitting: Physical Therapy

## 2016-12-31 ENCOUNTER — Encounter (HOSPITAL_COMMUNITY): Payer: BLUE CROSS/BLUE SHIELD | Admitting: Physical Therapy

## 2017-01-02 ENCOUNTER — Encounter (HOSPITAL_COMMUNITY): Payer: BLUE CROSS/BLUE SHIELD | Admitting: Physical Therapy

## 2017-01-02 DIAGNOSIS — N3020 Other chronic cystitis without hematuria: Secondary | ICD-10-CM | POA: Diagnosis not present

## 2017-01-02 DIAGNOSIS — N110 Nonobstructive reflux-associated chronic pyelonephritis: Secondary | ICD-10-CM | POA: Diagnosis not present

## 2017-01-02 DIAGNOSIS — E1321 Other specified diabetes mellitus with diabetic nephropathy: Secondary | ICD-10-CM | POA: Diagnosis not present

## 2017-01-02 DIAGNOSIS — N200 Calculus of kidney: Secondary | ICD-10-CM | POA: Diagnosis not present

## 2017-01-04 ENCOUNTER — Encounter (HOSPITAL_COMMUNITY): Payer: BLUE CROSS/BLUE SHIELD | Admitting: Physical Therapy

## 2017-01-27 DIAGNOSIS — N200 Calculus of kidney: Secondary | ICD-10-CM | POA: Diagnosis not present

## 2017-01-31 DIAGNOSIS — M62838 Other muscle spasm: Secondary | ICD-10-CM | POA: Diagnosis not present

## 2017-02-11 ENCOUNTER — Encounter: Payer: Self-pay | Admitting: Family Medicine

## 2017-02-11 ENCOUNTER — Encounter: Payer: Self-pay | Admitting: Nurse Practitioner

## 2017-02-11 ENCOUNTER — Ambulatory Visit (HOSPITAL_COMMUNITY)
Admission: RE | Admit: 2017-02-11 | Discharge: 2017-02-11 | Disposition: A | Discharge status: Home / Self Care | Payer: BLUE CROSS/BLUE SHIELD | Source: Ambulatory Visit | Attending: Nurse Practitioner | Admitting: Nurse Practitioner

## 2017-02-11 ENCOUNTER — Ambulatory Visit (INDEPENDENT_AMBULATORY_CARE_PROVIDER_SITE_OTHER): Payer: BLUE CROSS/BLUE SHIELD | Admitting: Nurse Practitioner

## 2017-02-11 VITALS — BP 150/102 | Ht 64.0 in | Wt 275.4 lb

## 2017-02-11 DIAGNOSIS — R918 Other nonspecific abnormal finding of lung field: Secondary | ICD-10-CM | POA: Insufficient documentation

## 2017-02-11 DIAGNOSIS — I517 Cardiomegaly: Secondary | ICD-10-CM | POA: Insufficient documentation

## 2017-02-11 DIAGNOSIS — I10 Essential (primary) hypertension: Secondary | ICD-10-CM

## 2017-02-11 DIAGNOSIS — M5134 Other intervertebral disc degeneration, thoracic region: Secondary | ICD-10-CM | POA: Insufficient documentation

## 2017-02-11 DIAGNOSIS — M546 Pain in thoracic spine: Secondary | ICD-10-CM

## 2017-02-11 DIAGNOSIS — R05 Cough: Secondary | ICD-10-CM | POA: Insufficient documentation

## 2017-02-11 DIAGNOSIS — E119 Type 2 diabetes mellitus without complications: Secondary | ICD-10-CM | POA: Diagnosis not present

## 2017-02-11 DIAGNOSIS — R059 Cough, unspecified: Secondary | ICD-10-CM

## 2017-02-11 DIAGNOSIS — M47814 Spondylosis without myelopathy or radiculopathy, thoracic region: Secondary | ICD-10-CM | POA: Diagnosis not present

## 2017-02-11 MED ORDER — MELOXICAM 15 MG PO TABS: 15.0000 mg | ORAL_TABLET | Freq: Every day | ORAL | 0 refills | Status: DC

## 2017-02-11 MED ORDER — LIRAGLUTIDE 18 MG/3ML ~~LOC~~ SOPN: PEN_INJECTOR | SUBCUTANEOUS | 2 refills | Status: DC

## 2017-02-11 MED ORDER — LOSARTAN POTASSIUM 100 MG PO TABS: 100.0000 mg | ORAL_TABLET | Freq: Every day | ORAL | 0 refills | Status: DC

## 2017-02-11 NOTE — Patient Instructions (Signed)
Phentermine Qsymia Belviq Contrave Saxenda (Victoza)  Victoza (tanzeum) byetta (Bydureon) trulicity

## 2017-02-12 DIAGNOSIS — M1711 Unilateral primary osteoarthritis, right knee: Secondary | ICD-10-CM | POA: Diagnosis not present

## 2017-02-13 ENCOUNTER — Telehealth: Payer: Self-pay | Admitting: Nurse Practitioner

## 2017-02-13 ENCOUNTER — Encounter: Payer: Self-pay | Admitting: Nurse Practitioner

## 2017-02-13 MED ORDER — LEVOFLOXACIN 500 MG PO TABS: 500.0000 mg | ORAL_TABLET | Freq: Every day | ORAL | 0 refills | Status: DC

## 2017-02-13 NOTE — Telephone Encounter (Signed)
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

## 2017-02-13 NOTE — Telephone Encounter (Signed)
Sorry saw where meloxicam added, cancel relafen script, cont meloxicam need to give it more time, if pt feels necessary can add prn hydrocod 5/325 numb 24 one po six hrs prn pain use sparinly

## 2017-02-13 NOTE — Progress Notes (Signed)
Subjective:  Presents for recheck on her blood pressure. Has been running higher than usual. Having significant issues with her right knee, orthopedic surgeon would like to do replacement but she needs to lose 50 pounds first. Has had to limit activity due to knee pain which has caused difficulty with her weight. Can get her lab work done through her employer at no cost. Has taken Victoza in the past with great results, lost over 60 pounds while taking this in the past with good control of her sugars. Has had persistent allergies and wheezing since June. Congested cough. Producing mainly clear mucus. Occasional use of albuterol inhaler. Now having some lower thoracic mid back pain mainly on the left side. No specific history of injury. Previous history of smoking. No unusual shortness of breath. No edema. No orthopnea. Has a history of multiple kidney stones. No current urinary symptoms. No flank pain. No abdominal pain. No personal or family history of thyroid or endocrine cancer. No personal history of pancreatitis.  Objective:   BP (!) 150/102   Ht 5\' 4"  (1.626 m)   Wt 275 lb 6 oz (124.9 kg)   SpO2 95%   BMI 47.27 kg/m  NAD. Alert, oriented. TMs mild clear effusion, no erythema. Pharynx nonerythematous with cloudy PND noted. Neck supple with mild soft anterior adenopathy. Lungs clear. Occasional congested cough noted. No wheezing or tachypnea. Heart regular rate rhythm. Abdomen soft nontender. Mild localized tenderness noted in the left mid thoracic spinal area. Skin is clear. Lower extremities no edema.  Assessment:   Problem List Items Addressed This Visit      Cardiovascular and Mediastinum   Essential hypertension, benign   Relevant Medications   losartan (COZAAR) 100 MG tablet     Endocrine   Type 2 diabetes mellitus without complication, without long-term current use of insulin (HCC) - Primary   Relevant Medications   losartan (COZAAR) 100 MG tablet   liraglutide (VICTOZA) 18 MG/3ML  SOPN     Other   Morbid obesity due to excess calories (HCC)   Relevant Medications   liraglutide (VICTOZA) 18 MG/3ML SOPN    Other Visit Diagnoses    Acute left-sided thoracic back pain       Relevant Medications   cyclobenzaprine (FLEXERIL) 10 MG tablet   meloxicam (MOBIC) 15 MG tablet   Other Relevant Orders   DG Thoracic Spine 1 View (Completed)   Cough       Relevant Orders   DG Chest 2 View (Completed)        Plan:   Meds ordered this encounter  Medications  . nitrofurantoin, macrocrystal-monohydrate, (MACROBID) 100 MG capsule  . cyclobenzaprine (FLEXERIL) 10 MG tablet  . meloxicam (MOBIC) 15 MG tablet    Sig: Take 1 tablet (15 mg total) by mouth daily.    Dispense:  30 tablet    Refill:  0    Order Specific Question:   Supervising Provider    Answer:   Mikey Kirschner [[id number]]  . losartan (COZAAR) 100 MG tablet    Sig: Take 1 tablet (100 mg total) by mouth daily.    Dispense:  90 tablet    Refill:  0    Order Specific Question:   Supervising Provider    Answer:   Mikey Kirschner [[id number]]  . liraglutide (VICTOZA) 18 MG/3ML SOPN    Sig: 0.6 mg [place] qd x 1 week then 1.2 mg [place] qd    Dispense:  3 mL    Refill:  2    Patient will be bringing in coupon/voucher    Order Specific Question:   Supervising Provider    Answer:   Mikey Kirschner [[id number]]  . levofloxacin (LEVAQUIN) 500 MG tablet    Sig: Take 1 tablet (500 mg total) by mouth daily.    Dispense:  10 tablet    Refill:  0    Order Specific Question:   Supervising Provider    Answer:   Mikey Kirschner [[id number]]   DC valsartan due to recent concerns about carcinogens. Switched to losartan 100 mg daily. Given prescription with requested lab work. Nurse practitioner at her employer to check BP and send results to the office. Restart Victoza as directed. Chest x-ray and thoracic spine x-ray pending. Use mobic for pain, take with food. DC if any reflux or abdominal pain. Over 50% of this visit was spent in  consultation and discussion. Return in about 1 month (around 03/14/2017) for recheck.

## 2017-02-13 NOTE — Telephone Encounter (Signed)
Left message to return call 02/13/17 *(per Dr.Steve disregard Zpak and continue [employer] per Hoyle Sauer. We may still order pain medication)

## 2017-02-13 NOTE — Telephone Encounter (Signed)
Requesting results to recent x-ray.  She said she really needs something for her back.

## 2017-02-15 ENCOUNTER — Other Ambulatory Visit: Payer: Self-pay | Admitting: *Deleted

## 2017-02-15 MED ORDER — HYDROCODONE-ACETAMINOPHEN 5-325 MG PO TABS: 1.0000 | ORAL_TABLET | Freq: Four times a day (QID) | ORAL | 0 refills | Status: DC | PRN

## 2017-02-15 NOTE — Telephone Encounter (Signed)
Discussed with pt. Pt verbalized understanding. She does want hydrocodone. Script ready for pickup.

## 2017-02-22 ENCOUNTER — Encounter: Payer: Self-pay | Admitting: Nurse Practitioner

## 2017-02-22 ENCOUNTER — Ambulatory Visit (INDEPENDENT_AMBULATORY_CARE_PROVIDER_SITE_OTHER): Payer: BLUE CROSS/BLUE SHIELD | Admitting: Nurse Practitioner

## 2017-02-22 VITALS — BP 132/86 | Ht 64.0 in | Wt 276.4 lb

## 2017-02-22 DIAGNOSIS — M546 Pain in thoracic spine: Secondary | ICD-10-CM

## 2017-02-22 DIAGNOSIS — M62838 Other muscle spasm: Secondary | ICD-10-CM

## 2017-02-22 MED ORDER — CYCLOBENZAPRINE HCL 10 MG PO TABS: 10.0000 mg | ORAL_TABLET | Freq: Three times a day (TID) | ORAL | 2 refills | Status: DC | PRN

## 2017-02-22 NOTE — Patient Instructions (Addendum)
TENS unit Conan Bowens Ice/heat applications Lidocaine patches biofreeze

## 2017-02-23 ENCOUNTER — Encounter: Payer: Self-pay | Admitting: Nurse Practitioner

## 2017-02-23 NOTE — Progress Notes (Signed)
Subjective:  Presents for recheck of mid upper back pain is been going on for the past 4 weeks. No specific history of injury. Pain is around the mid back where her bra snaps, will occasionally radiate up into the upper back/shoulder area. Will also radiate underneath both breast along the rib cage bilaterally. No fever. No cough. No shortness of breath or chest pain. No edema. No nausea vomiting or abdominal pain. Better with heat applications. Describes as a catching grabbing sensation worse with certain movements such as bending over prolonged sitting rolling over or getting out of the bed. Slightly worse on the right side. Remainder of the pain is like a dull ache. Some relief of Flexeril. Patient questions whether her knee pain has caused changes in how she walks leading to this pain.   Objective:   BP 132/86   Ht 5\' 4"  (1.626 m)   Wt 276 lb 6.4 oz (125.4 kg)   BMI 47.44 kg/m  NAD. Alert, oriented. Lungs clear. Heart regular rate rhythm. Abdomen soft nontender. Very tight tender muscles noted along the upper back and neck area more so on the right. Tenderness extends down into the rhomboids to about the mid back area bilateral. Minimal tenderness along the thoracic spine area. Mild tenderness to palpation of the ribs just beneath the breast. Skin is clear.   Assessment:  Acute bilateral thoracic back pain  Muscle spasms of neck    Plan:   Meds ordered this encounter  Medications  . cyclobenzaprine (FLEXERIL) 10 MG tablet    Sig: Take 1 tablet (10 mg total) by mouth 3 (three) times daily as needed for muscle spasms.    Dispense:  30 tablet    Refill:  2    Order Specific Question:   Supervising Provider    Answer:   Mikey Kirschner [[id number]]   Continue Flexeril as directed. Continue anti-inflammatories as directed with food, DC if any stomach upset.  Recommend TENS unit, given prescription. Continue heat applications. Also consider lidocaine patch or Biofreeze. Call back next week if no  improvement, sooner if worse or if new symptoms develop. 25 minutes was spent with the patient. Greater than half the time was spent in discussion and answering questions and counseling regarding the issues that the patient came in for today.

## 2017-02-25 ENCOUNTER — Other Ambulatory Visit: Payer: Self-pay | Admitting: Family Medicine

## 2017-02-25 DIAGNOSIS — M546 Pain in thoracic spine: Secondary | ICD-10-CM | POA: Diagnosis not present

## 2017-02-25 DIAGNOSIS — M9903 Segmental and somatic dysfunction of lumbar region: Secondary | ICD-10-CM | POA: Diagnosis not present

## 2017-02-25 DIAGNOSIS — M9902 Segmental and somatic dysfunction of thoracic region: Secondary | ICD-10-CM | POA: Diagnosis not present

## 2017-02-25 DIAGNOSIS — M9905 Segmental and somatic dysfunction of pelvic region: Secondary | ICD-10-CM | POA: Diagnosis not present

## 2017-02-26 ENCOUNTER — Other Ambulatory Visit: Payer: Self-pay | Admitting: Family Medicine

## 2017-02-27 DIAGNOSIS — M546 Pain in thoracic spine: Secondary | ICD-10-CM | POA: Diagnosis not present

## 2017-02-27 DIAGNOSIS — M9903 Segmental and somatic dysfunction of lumbar region: Secondary | ICD-10-CM | POA: Diagnosis not present

## 2017-02-27 DIAGNOSIS — M9902 Segmental and somatic dysfunction of thoracic region: Secondary | ICD-10-CM | POA: Diagnosis not present

## 2017-02-27 DIAGNOSIS — M9905 Segmental and somatic dysfunction of pelvic region: Secondary | ICD-10-CM | POA: Diagnosis not present

## 2017-03-01 DIAGNOSIS — M546 Pain in thoracic spine: Secondary | ICD-10-CM | POA: Diagnosis not present

## 2017-03-01 DIAGNOSIS — M9903 Segmental and somatic dysfunction of lumbar region: Secondary | ICD-10-CM | POA: Diagnosis not present

## 2017-03-01 DIAGNOSIS — M9905 Segmental and somatic dysfunction of pelvic region: Secondary | ICD-10-CM | POA: Diagnosis not present

## 2017-03-01 DIAGNOSIS — M9902 Segmental and somatic dysfunction of thoracic region: Secondary | ICD-10-CM | POA: Diagnosis not present

## 2017-03-04 DIAGNOSIS — M9903 Segmental and somatic dysfunction of lumbar region: Secondary | ICD-10-CM | POA: Diagnosis not present

## 2017-03-04 DIAGNOSIS — M9905 Segmental and somatic dysfunction of pelvic region: Secondary | ICD-10-CM | POA: Diagnosis not present

## 2017-03-04 DIAGNOSIS — M9902 Segmental and somatic dysfunction of thoracic region: Secondary | ICD-10-CM | POA: Diagnosis not present

## 2017-03-04 DIAGNOSIS — M546 Pain in thoracic spine: Secondary | ICD-10-CM | POA: Diagnosis not present

## 2017-03-12 ENCOUNTER — Telehealth: Payer: Self-pay | Admitting: Nurse Practitioner

## 2017-03-12 DIAGNOSIS — Z719 Counseling, unspecified: Secondary | ICD-10-CM | POA: Diagnosis not present

## 2017-03-12 DIAGNOSIS — E782 Mixed hyperlipidemia: Secondary | ICD-10-CM | POA: Diagnosis not present

## 2017-03-12 DIAGNOSIS — Z6841 Body Mass Index (BMI) 40.0 and over, adult: Secondary | ICD-10-CM | POA: Diagnosis not present

## 2017-03-12 DIAGNOSIS — Z008 Encounter for other general examination: Secondary | ICD-10-CM | POA: Diagnosis not present

## 2017-03-12 DIAGNOSIS — E559 Vitamin D deficiency, unspecified: Secondary | ICD-10-CM | POA: Diagnosis not present

## 2017-03-12 DIAGNOSIS — E119 Type 2 diabetes mellitus without complications: Secondary | ICD-10-CM | POA: Diagnosis not present

## 2017-03-12 NOTE — Telephone Encounter (Signed)
Seen by carolyn 8/17 for back pain

## 2017-03-12 NOTE — Telephone Encounter (Signed)
Chart reviewed. Comlicated. o v myself or carolyn this wk

## 2017-03-12 NOTE — Telephone Encounter (Signed)
Patient said that her back is no better.  She said it is hard for her to walk and she is out of pain pills that were prescribed by Dr. Richardson Landry.  She went to a chiropractor and she doesn't think it helped her.  The chiropractor was supposed to see about her going for an MRI, but they have not heard anything as of this morning.  Patient wants to know what Dr. Richardson Landry suggests?  She is hoping for a call back today.

## 2017-03-12 NOTE — Telephone Encounter (Signed)
Spoke with patient and informed her per Dr.Steve Luking- recommend office visit with himself or Hoyle Sauer this week. Patient verbalized understanding and was transferred to front desk to schedule office visit.

## 2017-03-13 ENCOUNTER — Encounter: Payer: Self-pay | Admitting: Family Medicine

## 2017-03-13 ENCOUNTER — Ambulatory Visit (INDEPENDENT_AMBULATORY_CARE_PROVIDER_SITE_OTHER): Payer: BLUE CROSS/BLUE SHIELD | Admitting: Family Medicine

## 2017-03-13 VITALS — BP 162/118 | Temp 98.2°F | Ht 64.5 in | Wt 270.0 lb

## 2017-03-13 DIAGNOSIS — M546 Pain in thoracic spine: Secondary | ICD-10-CM

## 2017-03-13 DIAGNOSIS — M545 Low back pain: Secondary | ICD-10-CM

## 2017-03-13 DIAGNOSIS — G8929 Other chronic pain: Secondary | ICD-10-CM | POA: Diagnosis not present

## 2017-03-13 LAB — POCT URINALYSIS DIPSTICK
Blood, UA: NEGATIVE
Nitrite, UA: NEGATIVE
SPEC GRAV UA: 1.02 (ref 1.010–1.025)
pH, UA: 5 (ref 5.0–8.0)

## 2017-03-13 MED ORDER — HYDROCODONE-ACETAMINOPHEN 5-325 MG PO TABS: 1.0000 | ORAL_TABLET | Freq: Four times a day (QID) | ORAL | 0 refills | Status: DC | PRN

## 2017-03-13 MED ORDER — BUMETANIDE 1 MG PO TABS: 2.0000 mg | ORAL_TABLET | Freq: Every day | ORAL | 0 refills | Status: DC

## 2017-03-13 NOTE — Progress Notes (Signed)
   Subjective:    Patient ID: Joanna Reid, female    DOB: 06/24/1966, 51 y.o.   MRN: [id number]  Back Pain  This is a recurrent problem. The current episode started more than 1 month ago. The quality of the pain is described as stabbing.  Pain starts mid back and radiates to the front of abd.Has tried heating pad,vicoden, tens unit,Chiropractor, none have helped  Persistent med back pain  radiats around towards the front symmetric  Very painful, worse with movement at times  Pt now on a suppressant dose for uti's  Facing right knee surgery  Pt theorizes painful knee leading to ack discomfort  Review of Systems  Musculoskeletal: Positive for back pain.   Results for orders placed or performed in visit on 03/13/17  POCT urinalysis dipstick  Result Value Ref Range   Color, UA     Clarity, UA     Glucose, UA     Bilirubin, UA     Ketones, UA     Spec Grav, UA 1.020 1.010 - 1.025   Blood, UA negative    pH, UA 5.0 5.0 - 8.0   Protein, UA     Urobilinogen, UA  0.2 or 1.0 E.U./dL   Nitrite, UA negative    Leukocytes, UA Trace (A) Negative       Objective:   Physical Exam Alert and oriented, vitals reviewed and stable, NAD ENT-TM's and ext canals WNL bilat via otoscopic exam Soft palate, tonsils and post pharynx WNL via oropharyngeal exam Neck-symmetric, no masses; thyroid nonpalpable and nontender Pulmonary-no tachypnea or accessory muscle use; Clear without wheezes via auscultation Card--no abnrml murmurs, rhythm reg and rate WNL Carotid pulses symmetric, without bruits Severe thoracic pain to percussion. No true chest wall tenderness. Upper mid abdomen not tender to palpation       Assessment & Plan:  Impression simple severe mid thorax pain with radiation to the left. Persisting now for 6 weeks despite medications and physical therapy via patient's chiropractor. Pain is severe and unrelenting. We'll press on and do MRI of involved area. X-ray did reveal some  degenerative changes so this could represent neuropathic pain. Discussed. Pain control discussed. Hydrocodone prescribed. Proper use discussed. Further recommendations based on thoracic spine MR  Greater than 50% of this 25 minute face to face visit was spent in counseling and discussion and coordination of care regarding the above diagnosis/diagnosies

## 2017-03-14 ENCOUNTER — Telehealth: Payer: Self-pay | Admitting: Family Medicine

## 2017-03-14 NOTE — Telephone Encounter (Signed)
Patient's MRI has been denied by [facility] stating that it does not meet medical necessity. Patient has to have had back pain for more than 12 weeks with treatments such as medications, physical therapy or stretching exercises that she has tried and failed under the direction of her Doctor.She must have office visit in between trying medications or stretching exercises that say that they did not work or abnormal imaging. Please advise?

## 2017-03-15 ENCOUNTER — Telehealth: Payer: Self-pay | Admitting: Family Medicine

## 2017-03-15 ENCOUNTER — Encounter: Payer: Self-pay | Admitting: Family Medicine

## 2017-03-15 NOTE — Telephone Encounter (Signed)
Please see phone message from 03/14/17

## 2017-03-15 NOTE — Telephone Encounter (Signed)
Patient's called stating that she needs a work note to be out of work until MRI is improved. Spoke with Dr.Scott Luking patient may have a work note through 03/20/17

## 2017-03-15 NOTE — Telephone Encounter (Signed)
Patient said she received a call from Joanna Reid letting her know that her insurance denied her MRI.  Please advise.

## 2017-03-16 NOTE — Telephone Encounter (Signed)
plz send this to whoever is or will work on [place]

## 2017-03-17 NOTE — Telephone Encounter (Signed)
Tell pt her insur co refusing to do until twelve full weeks of symptoms and failure on intensive therapy such as physical therap, rec o v next wk to f u on pain and status

## 2017-03-18 ENCOUNTER — Ambulatory Visit (HOSPITAL_COMMUNITY): Payer: BLUE CROSS/BLUE SHIELD

## 2017-03-18 NOTE — Telephone Encounter (Signed)
Patient states she already paid her portion on her Mri, and it was denied by insurance to be covered until Pt,and other tried and failed treatments.I advised that she call [facility] and have her money reimburst to her. She is aware that she will have to have an ov next week with [name] to set up the Pt. Transferred to Up front to schedule that appointment.

## 2017-03-19 ENCOUNTER — Encounter: Payer: Self-pay | Admitting: Family Medicine

## 2017-03-19 ENCOUNTER — Telehealth: Payer: Self-pay | Admitting: Family Medicine

## 2017-03-19 DIAGNOSIS — I10 Essential (primary) hypertension: Secondary | ICD-10-CM | POA: Diagnosis not present

## 2017-03-19 DIAGNOSIS — E119 Type 2 diabetes mellitus without complications: Secondary | ICD-10-CM | POA: Diagnosis not present

## 2017-03-19 DIAGNOSIS — E782 Mixed hyperlipidemia: Secondary | ICD-10-CM | POA: Diagnosis not present

## 2017-03-19 DIAGNOSIS — E559 Vitamin D deficiency, unspecified: Secondary | ICD-10-CM | POA: Diagnosis not present

## 2017-03-19 NOTE — Telephone Encounter (Signed)
Patient would also like to know if he work note can be extended to Friday. Patient still having extensive back pain.

## 2017-03-19 NOTE — Telephone Encounter (Signed)
Pt dropped off recent lab results. See results in blue folder in office.

## 2017-03-19 NOTE — Telephone Encounter (Signed)
error 

## 2017-03-19 NOTE — Telephone Encounter (Signed)
ok 

## 2017-03-20 ENCOUNTER — Ambulatory Visit: Payer: BLUE CROSS/BLUE SHIELD | Admitting: Nurse Practitioner

## 2017-03-20 NOTE — Telephone Encounter (Signed)
Noted  

## 2017-03-20 NOTE — Telephone Encounter (Signed)
Please review the denial letter for patients' MRI in your yellow folder in your office.

## 2017-03-20 NOTE — Telephone Encounter (Signed)
Reviewed but my rec from earlier still the same

## 2017-03-21 DIAGNOSIS — M47816 Spondylosis without myelopathy or radiculopathy, lumbar region: Secondary | ICD-10-CM | POA: Insufficient documentation

## 2017-03-21 DIAGNOSIS — M47814 Spondylosis without myelopathy or radiculopathy, thoracic region: Secondary | ICD-10-CM | POA: Insufficient documentation

## 2017-03-21 DIAGNOSIS — G894 Chronic pain syndrome: Secondary | ICD-10-CM | POA: Insufficient documentation

## 2017-03-26 ENCOUNTER — Ambulatory Visit: Payer: BLUE CROSS/BLUE SHIELD | Admitting: Family Medicine

## 2017-03-26 DIAGNOSIS — F4542 Pain disorder with related psychological factors: Secondary | ICD-10-CM | POA: Diagnosis not present

## 2017-03-26 DIAGNOSIS — M47814 Spondylosis without myelopathy or radiculopathy, thoracic region: Secondary | ICD-10-CM | POA: Diagnosis not present

## 2017-03-26 DIAGNOSIS — M5136 Other intervertebral disc degeneration, lumbar region: Secondary | ICD-10-CM | POA: Diagnosis not present

## 2017-03-26 DIAGNOSIS — M5134 Other intervertebral disc degeneration, thoracic region: Secondary | ICD-10-CM | POA: Diagnosis not present

## 2017-03-26 DIAGNOSIS — M47816 Spondylosis without myelopathy or radiculopathy, lumbar region: Secondary | ICD-10-CM | POA: Diagnosis not present

## 2017-03-26 DIAGNOSIS — G894 Chronic pain syndrome: Secondary | ICD-10-CM | POA: Diagnosis not present

## 2017-03-26 DIAGNOSIS — Z79899 Other long term (current) drug therapy: Secondary | ICD-10-CM | POA: Diagnosis not present

## 2017-03-26 DIAGNOSIS — Z79891 Long term (current) use of opiate analgesic: Secondary | ICD-10-CM | POA: Diagnosis not present

## 2017-03-26 DIAGNOSIS — Z7951 Long term (current) use of inhaled steroids: Secondary | ICD-10-CM | POA: Diagnosis not present

## 2017-03-26 DIAGNOSIS — M1711 Unilateral primary osteoarthritis, right knee: Secondary | ICD-10-CM | POA: Diagnosis not present

## 2017-04-01 ENCOUNTER — Encounter: Payer: Self-pay | Admitting: Family Medicine

## 2017-04-01 ENCOUNTER — Ambulatory Visit (INDEPENDENT_AMBULATORY_CARE_PROVIDER_SITE_OTHER): Payer: BLUE CROSS/BLUE SHIELD | Admitting: Family Medicine

## 2017-04-01 VITALS — BP 158/100 | Ht 64.5 in | Wt 274.0 lb

## 2017-04-01 DIAGNOSIS — M549 Dorsalgia, unspecified: Secondary | ICD-10-CM

## 2017-04-01 MED ORDER — HYDROCODONE-ACETAMINOPHEN 5-325 MG PO TABS: 1.0000 | ORAL_TABLET | Freq: Four times a day (QID) | ORAL | 0 refills | Status: DC | PRN

## 2017-04-01 NOTE — Progress Notes (Signed)
   Subjective:    Patient ID: Joanna Reid, female    DOB: Dec 27, 1965, 51 y.o.   MRN: [id number] Patient presents for a very protracted discussion Back Pain  This is a recurrent problem. Episode onset: july. Treatments tried: xray, gabapentin, baclofen, meloxicam, flexeril, ice heat, tens unit, hydrocodone.   Pt wnt and saw dr Francesco Runner  Epidural was given by dr Francesco Runner last Thursday  Pain and discomfort slightly improved  Pt I [place]  May be bulging discs   Holding of on further chiro prac care at this time      gbapentin and baclofen, no sig drowsiness with that combination   Very sens at night often requires narcotics  Due to see dr Francesco Runner on the fourth    Review of Systems  Musculoskeletal: Positive for back pain.  No headache, no major weight loss or weight gain, no chest pain no abdominal pain no change in bowel habits complete ROS otherwise negative      Objective:   Physical Exam Alert and oriented, vitals reviewed and stable, NAD ENT-TM's and ext canals WNL bilat via otoscopic exam Soft palate, tonsils and post pharynx WNL via oropharyngeal exam Neck-symmetric, no masses; thyroid nonpalpable and nontender Pulmonary-no tachypnea or accessory muscle use; Clear without wheezes via auscultation Card--no abnrml murmurs, rhythm reg and rate WNL Carotid pulses symmetric, without bruits Positive mid thoracic pain to percussion arm strength sensation intact. Some paraspinal tenderness also evident       Assessment & Plan:  Impression mid thoracic pain. Status post epidural injection. Very long discussion held. Patient's insurance company is refusing an MRI. Pain is been fairly severe. Patient not sure if the epidural injection from Dr. Francesco Runner has helped. Due to follow-up with him soon. The insurance company may occur then patient wants a "conservative" approach before authorizing an MRI ID a full physical therapy approach along with other modalities.  Between physical therapy and Dr. Isabelle Course interventions he could be that her pre-MRI workup may exceed the cost of MRI. Discussed with patient. Nighttime hydrocodone. Local measures discussed recheck in one month  Greater than 50% of this 25 minute face to face visit was spent in counseling and discussion and coordination of care regarding the above diagnosis/diagnosies

## 2017-04-02 ENCOUNTER — Encounter: Payer: Self-pay | Admitting: Family Medicine

## 2017-04-09 ENCOUNTER — Telehealth (HOSPITAL_COMMUNITY): Payer: Self-pay | Admitting: Family Medicine

## 2017-04-09 ENCOUNTER — Ambulatory Visit (HOSPITAL_COMMUNITY): Payer: BLUE CROSS/BLUE SHIELD

## 2017-04-09 NOTE — Telephone Encounter (Signed)
04/09/17  wanted to reschedule because she would have had kids with her and she didn't want to come here with them

## 2017-04-15 DIAGNOSIS — G894 Chronic pain syndrome: Secondary | ICD-10-CM | POA: Diagnosis not present

## 2017-04-15 DIAGNOSIS — M5134 Other intervertebral disc degeneration, thoracic region: Secondary | ICD-10-CM | POA: Diagnosis not present

## 2017-04-15 DIAGNOSIS — M47814 Spondylosis without myelopathy or radiculopathy, thoracic region: Secondary | ICD-10-CM | POA: Diagnosis not present

## 2017-04-17 ENCOUNTER — Encounter (HOSPITAL_COMMUNITY): Payer: Self-pay | Admitting: Physical Therapy

## 2017-04-17 ENCOUNTER — Ambulatory Visit: Payer: BLUE CROSS/BLUE SHIELD | Admitting: Family Medicine

## 2017-04-17 ENCOUNTER — Ambulatory Visit (HOSPITAL_COMMUNITY): Payer: BLUE CROSS/BLUE SHIELD | Attending: Family Medicine | Admitting: Physical Therapy

## 2017-04-17 DIAGNOSIS — R293 Abnormal posture: Secondary | ICD-10-CM

## 2017-04-17 DIAGNOSIS — M546 Pain in thoracic spine: Secondary | ICD-10-CM | POA: Diagnosis not present

## 2017-04-17 DIAGNOSIS — R29898 Other symptoms and signs involving the musculoskeletal system: Secondary | ICD-10-CM | POA: Diagnosis not present

## 2017-04-17 NOTE — Patient Instructions (Signed)
MOIST HEAT  Place heat on your mid back for 10 minutes, at least 5 times per day. Do NOT use in combination with icy-hot or other chemical gels.   It may help to use the heat before your exercises as well.   3D THORACIC EXCURSIONS  10 times each direction, 2 times per day.   Do not push through pain- just go to the point of discomfort.   This may be stiff and sore at first, but should improve with time.      Pec Stretch  Find corner or doorway, place hands 90 degrees and lean into corner.  Do not bring head forward during stretch.  Should feel stretch in chest.   Hold for 30 seconds. Repeat 2-3 times, twice a day.

## 2017-04-17 NOTE — Therapy (Signed)
[place], [place], [address] Phone: [phone]   Fax:  [phone]  Physical Therapy Evaluation  Patient Details  Name: Joanna Reid MRN: [id number] Date of Birth: 07-29-1965 Referring Provider: Baltazar Apo   Encounter Date: 04/17/2017      PT End of Session - 04/17/17 1612    Visit Number 1   Number of Visits 13   Date for PT Re-Evaluation 05/08/17   Authorization Type BC[employer] Other (90 visits per year, 13 used)   Authorization Time Period 04/17/17 to 05/29/17   Authorization - Visit Number 1   Authorization - Number of Visits 5   PT Start Time 1522   PT Stop Time 7939  patient a few minutes late    PT Time Calculation (min) 36 min   Activity Tolerance Patient tolerated treatment well;Patient limited by pain   Behavior During Therapy [facility] for tasks assessed/performed      Past Medical History:  Diagnosis Date  . Acid reflux   . Allergic rhinitis   . Diabetes mellitus    type 2  . Gout   . Gout   . HBP (high blood pressure)   . Migraines     Past Surgical History:  Procedure Laterality Date  . CESAREAN SECTION    . EYE SURGERY    . HEMORRHOID SURGERY N/A 11/19/2012   Procedure: HEMORRHOIDECTOMY;  Surgeon: Jamesetta So, MD;  Location: [facility];  Service: General;  Laterality: N/A;  . kidney stones  1998  . LAPAROSCOPIC UNILATERAL SALPINGO OOPHERECTOMY  05/14/2012   Procedure: LAPAROSCOPIC UNILATERAL SALPINGO OOPHORECTOMY;  Surgeon: [name], MD;  Location: [facility];  Service: Gynecology;  Laterality: Right;  laparoscopic right salpingo-oophorectomy  . PARTIAL HYSTERECTOMY    . TONSILECTOMY, ADENOIDECTOMY, BILATERAL MYRINGOTOMY AND TUBES    . VESICOVAGINAL FISTULA CLOSURE W/ TAH      There were no vitals filed for this visit.       Subjective Assessment - 04/17/17 1522    Subjective Patient arrives stating that her back started bothering her in June and got very bad in July; her MD  treated her for pneumonia initially but this did nothing and her pain has gotten worse and worse. She cannot wear a bra or lay on her back, it is so painful. Her chriopractor would not work with her as they were scared of making it worse. She got an epidural 2 weeks ago in her spine, which took care of the radiating pain but it is siill bad. She is on baclofen and gabapentin as well. She wonders if her pain is from where she was overcompensating from when her knee was hurting so much. Her knee is bone on bone and she will eventually need a knee replacement.    Pertinent History DM, gout, ADHD, obesity, hx of PT to L knee    How long can you sit comfortably? by the end of the day, "it feels like I have no neck, I am hunched over and tight"    How long can you stand comfortably? if she can lean, she is OK; she generally has to lean    How long can you walk comfortably? unlimited    Patient Stated Goals return to PLOF, reduce pain, be able to wear bra again    Currently in Pain? Yes   Pain Score 6    Pain Location Back   Pain Orientation Mid;Right;Left   Pain Descriptors / Indicators Sharp;Tightness  Pain Type Chronic pain   Pain Radiating Towards radiates around ribs, better after epidural    Pain Onset More than a month ago   Pain Frequency Constant   Aggravating Factors  pretty much everything, coughing, breathing    Pain Relieving Factors epidural, medicines    Effect of Pain on Daily Activities severe             [facility] PT Assessment - 04/17/17 0001      Assessment   Medical Diagnosis unspecified back pain    Referring Provider Baltazar Apo    Onset Date/Surgical Date --  June 2018   Next MD Visit Dr. Wolfgang Phoenix the 22nd    Prior Therapy PT for her knee earlier this year      Precautions   Precautions None     Restrictions   Weight Bearing Restrictions No     Balance Screen   Has the patient fallen in the past 6 months Yes   How many times? 2   Has the patient had a  decrease in activity level because of a fear of falling?  Yes   Is the patient reluctant to leave their home because of a fear of falling?  No     Prior Function   Level of Independence Independent with basic ADLs;Independent;Independent with gait;Independent with transfers   Vocation Full time employment   Leisure sit down job      AROM   Overall AROM Comments shoulder ROM WFL but increaes pain; scapualr motion with UE elevation appears [facility]    Cervical Flexion WFL    Cervical Extension WFL    Cervical - Right Side Bend WFL    Cervical - Left Side Bend WFL    Cervical - Right Rotation WFL    Cervical - Left Rotation WFL    Lumbar Flexion 40% limited; pain with return to uprigth    Lumbar Extension WFL but painful thoracic spine    Lumbar - Right Side Bend WFL    Lumbar - Left Side Bend [facility]    Thoracic Flexion WFL, kyphotic posture, painful    Thoracic Extension severe limitation, painful    Thoracic - Right Side Bend WFL, painful    Thoracic - Left Side Bend WFL, painful    Thoracic - Right Rotation moderate limitation    Thoracic - Left Rotation moderate limitation         Hypomobiliy noted throughout thoracic spine, bilateral clavicles/sternoclavicular joints, rib heads B T3-T7;  Significant knotting noted B thoracic paraspinals and rhomboids       Objective measurements completed on examination: See above findings.                  PT Education - 04/17/17 1611    Education provided Yes   Education Details prognosis, POC, education regarding general structure and mechancis of thoracic spine and ribs as well as possible role in her current pain, HEP, exam findings    Person(s) Educated Patient   Methods Explanation;Demonstration   Comprehension Verbalized understanding;Returned demonstration;Need further instruction          PT Short Term Goals - 04/17/17 1622      PT SHORT TERM GOAL #1   Title Patient to be able to maintain upright posture at least  70% of the time without external cues in order to combat habitual kyphotic posture and reduce pain    Time 3   Period Weeks   Status New   Target Date 05/08/17  PT SHORT TERM GOAL #2   Title Patient to experience thoracic spine pain as being no more than 4/10 at worst in order to improve QOL    Time 3   Period Weeks   Status New     PT SHORT TERM GOAL #3   Title Patient to be able to perform normal depth breaths and sneeze/cough without exacerbation in pain in order to show improvement in condition and QOL    Time 3   Period Weeks   Status New     PT SHORT TERM GOAL #4   Title Patient to be compliant with correct performance of appropriate HEP as tolerated, updated as able    Time 1   Period Weeks   Status New   Target Date 04/24/17           PT Long Term Goals - 04/17/17 1624      PT LONG TERM GOAL #1   Title patient to experience pain in thoracic spine as being no more than 2/10 in order to improve QOL and functional task tolerance    Time 6   Period Weeks   Status New   Target Date 05/29/17     PT LONG TERM GOAL #2   Title Patient to report she has been able to sleep in her position of choice without increase in pain thoracic spine in order to improve QOL and sleep quality    Time 6   Period Weeks   Status New     PT LONG TERM GOAL #3   Title Patient to be able to tolerate wearing her bra for a full day without thoracic pain exacerbation in order to improve QOL and to assist in further supporting spine/reducing spine pain    Time 6   Status New     PT LONG TERM GOAL #4   Title Patient to be able to reach overhead without thoracic spine pain in order to improve QOL and tolerance to functional task performance    Time 6   Period [place] - 04/17/17 1618    Clinical Impression Statement Patient arrives with severe thoracic spine pain which started in June of this year and really became bad in July; she currently  cannot sleep on her back or wear a bra due to pain and generally feels severely limited at this time as even breathing and coughing are painful. Note she had been seeing a chiropractor, who performed spinal manipulations to her back and neck, however this never helped and she was discharged out of fear of making her pain worse. Examination reveals poor posture, severe hypomobility of thoracic spine, significant muscle knotting in bilateral thoracic paraspinals and in rhomboid muscle groups, and hypomobility of clavicles/thoracic vertebrae/bilateral rib heads (tested approximately T3 to T7 bilaterally). Suspect rib involvement due to reported symptoms with coughing and breathing as well as gross joint hypomobility likely exacerbated by kyphotic posture. Lumbar and cervical spines appear clear of involvement. HEP limited due to high irritability of condition right now, plan to expand as functional status improves. Recommend trial of skilled PT services to reduce pain, address functional impairments, and improve QOL moving forward.    History and Personal Factors relevant to plan of care: poor kyphotic posture at baseline, obesity   Clinical Presentation Stable   Clinical Presentation due to: possible biomechancial factors    Clinical Decision Making Low  Rehab Potential Fair   Clinical Impairments Affecting Rehab Potential (+) moderate outcomes with PT in the past, motivated; (-) obesity, chronicity of pain, idiopathic cause, chronic kyphotic posture    PT Frequency 2x / week   PT Duration 6 weeks   PT Treatment/Interventions ADLs/Self Care Home Management;Biofeedback;Cryotherapy;Electrical Stimulation;Iontophoresis 42m/ml Dexamethasone;Moist Heat;Traction;Ultrasound;Functional mobility training;Therapeutic activities;Therapeutic exercise;Balance training;Neuromuscular re-education;Patient/family education;Manual techniques;Passive range of motion;Dry needling;Taping   PT Next Visit Plan review initial  eval/goals, HEP; check on [place] to HEP; check for rotated thorcic vertebra/rotational MET as able, thoracic ROM and extensions over towel roll, thoracic rotations 90/90 position on table, heavy manual focus    PT Home Exercise Plan Eval (limited in scope due to high irritability of condition): increased time with heat on back, thoracic 3D excursions to tolerance, corner stretch    Consulted and Agree with Plan of Care Patient      Patient will benefit from skilled therapeutic intervention in order to improve the following deficits and impairments:  Improper body mechanics, Pain, Increased muscle spasms, Postural dysfunction, Decreased range of motion, Hypomobility, Impaired UE functional use, Impaired flexibility  Visit Diagnosis: Pain in thoracic spine - Plan: PT plan of care cert/re-cert  Abnormal posture - Plan: PT plan of care cert/re-cert  Other symptoms and signs involving the musculoskeletal system - Plan: PT plan of care cert/re-cert     Problem List Patient Active Problem List   Diagnosis Date Noted  . Type 2 diabetes mellitus without complication, without long-term current use of insulin ([place] 01/27/2016  . Morbid obesity due to excess calories ([place] 01/27/2016  . Anxiety as acute reaction to exceptional stress 01/13/2015  . Attention deficit hyperactivity disorder (ADHD), 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  . Gout 01/20/2014  . GERD (gastroesophageal reflux disease) 01/20/2014  . Essential hypertension, benign 01/20/2014  . Plantar fasciitis of left foot 09/19/2011    KDeniece ReePT, DPT [address] [place] [address]Phone: [phone]  Fax:  [phone] Name: Joanna HINKMRN: [id number] of Birth: 203-24-1967

## 2017-04-18 ENCOUNTER — Ambulatory Visit (HOSPITAL_COMMUNITY): Payer: BLUE CROSS/BLUE SHIELD | Admitting: Physical Therapy

## 2017-04-18 DIAGNOSIS — M546 Pain in thoracic spine: Secondary | ICD-10-CM

## 2017-04-18 DIAGNOSIS — R293 Abnormal posture: Secondary | ICD-10-CM

## 2017-04-18 DIAGNOSIS — R29898 Other symptoms and signs involving the musculoskeletal system: Secondary | ICD-10-CM
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
## 2017-04-24 ENCOUNTER — Ambulatory Visit (HOSPITAL_COMMUNITY): Payer: BLUE CROSS/BLUE SHIELD

## 2017-04-24 ENCOUNTER — Telehealth (HOSPITAL_COMMUNITY): Payer: Self-pay

## 2017-04-24 NOTE — Telephone Encounter (Signed)
She is going to have her grandkids today and can't make it

## 2017-04-26 ENCOUNTER — Encounter (HOSPITAL_COMMUNITY): Payer: Self-pay

## 2017-04-26 ENCOUNTER — Telehealth (HOSPITAL_COMMUNITY): Payer: Self-pay

## 2017-04-26 ENCOUNTER — Ambulatory Visit (HOSPITAL_COMMUNITY): Payer: BLUE CROSS/BLUE SHIELD

## 2017-04-26 DIAGNOSIS — R293 Abnormal posture: Secondary | ICD-10-CM | POA: Diagnosis not present

## 2017-04-26 DIAGNOSIS — R29898 Other symptoms and signs involving the musculoskeletal system: Secondary | ICD-10-CM | POA: Diagnosis not present

## 2017-04-26 DIAGNOSIS — M546 Pain in thoracic spine: Secondary | ICD-10-CM

## 2017-04-26 NOTE — Telephone Encounter (Signed)
Do not charge co-payment this session Collected last session and unable to stay due to weather

## 2017-04-26 NOTE — Therapy (Signed)
[place], [place], [address] Phone: [phone]   Fax:  [phone]  Physical Therapy Treatment  Patient Details  Name: Joanna Reid MRN: [id number] Date of Birth: Jun 25, 1966 Referring Provider: Baltazar Apo   Encounter Date: 04/26/2017      PT End of Session - 04/26/17 1741    Visit Number 2   Number of Visits 13   Date for PT Re-Evaluation 05/08/17   Authorization Type BCBS Other (90 visits per year, 13 used)   Authorization Time Period 04/17/17 to 05/29/17   Authorization - Visit Number 2   Authorization - Number of Visits 97   PT Start Time 3716   PT Stop Time 1820   PT Time Calculation (min) 45 min   Activity Tolerance Patient tolerated treatment well;Patient limited by pain   Behavior During Therapy [facility] for tasks assessed/performed      Past Medical History:  Diagnosis Date  . Acid reflux   . Allergic rhinitis   . Diabetes mellitus    type 2  . Gout   . Gout   . HBP (high blood pressure)   . Migraines     Past Surgical History:  Procedure Laterality Date  . CESAREAN SECTION    . EYE SURGERY    . HEMORRHOID SURGERY N/A 11/19/2012   Procedure: HEMORRHOIDECTOMY;  Surgeon: Jamesetta So, MD;  Location: [facility];  Service: General;  Laterality: N/A;  . kidney stones  1998  . LAPAROSCOPIC UNILATERAL SALPINGO OOPHERECTOMY  05/14/2012   Procedure: LAPAROSCOPIC UNILATERAL SALPINGO OOPHORECTOMY;  Surgeon: [name], MD;  Location: [facility];  Service: Gynecology;  Laterality: Right;  laparoscopic right salpingo-oophorectomy  . PARTIAL HYSTERECTOMY    . TONSILECTOMY, ADENOIDECTOMY, BILATERAL MYRINGOTOMY AND TUBES    . VESICOVAGINAL FISTULA CLOSURE W/ TAH      There were no vitals filed for this visit.      Subjective Assessment - 04/26/17 1739    Subjective Pt stated with the change in colder weather the last couple days, pain scale 5/10 thoracic region, Rt knee and Lt ankle (plantar  fascia).   Pertinent History DM, gout, ADHD, obesity, hx of PT to L knee    Patient Stated Goals return to PLOF, reduce pain, be able to wear bra again    Currently in Pain? Yes   Pain Score 5    Pain Location Thoracic   Pain Orientation Mid;Right;Left   Pain Onset More than a month ago   Pain Frequency Constant   Aggravating Factors  pretty much everything, coughting, breathing   Pain Relieving Factors epidural, medicines   Effect of Pain on Daily Activities severe                         [facility] Adult PT Treatment/Exercise - 04/26/17 0001      Exercises   Exercises Neck     Neck Exercises: Seated   Other Seated Exercise row 10x repeats tenderness   Other Seated Exercise 3D thoracic excursion with UE movements 10x     Manual Therapy   Manual Therapy Soft tissue mobilization   Manual therapy comments Manual complete separate than rest of tx   Soft tissue mobilization Prone position STM to Bil thoracic region with spasms noted erector spinae, rhomboids and levator scapula     Neck Exercises: Stretches   Corner Stretch 3 reps;30 seconds  PT Education - 04/26/17 1835    Education provided Yes   Education Details Reviewed form with HEP, discussed benefits of bra fitting    Person(s) Educated Patient   Methods Explanation;Demonstration;Verbal cues   Comprehension Verbalized understanding;Returned demonstration          PT Short Term Goals - 04/17/17 1622      PT SHORT TERM GOAL #1   Title Patient to be able to maintain upright posture at least 70% of the time without external cues in order to combat habitual kyphotic posture and reduce pain    Time 3   Period Weeks   Status New   Target Date 05/08/17     PT SHORT TERM GOAL #2   Title Patient to experience thoracic spine pain as being no more than 4/10 at worst in order to improve QOL    Time 3   Period Weeks   Status New     PT SHORT TERM GOAL #3   Title Patient to be able to  perform normal depth breaths and sneeze/cough without exacerbation in pain in order to show improvement in condition and QOL    Time 3   Period Weeks   Status New     PT SHORT TERM GOAL #4   Title Patient to be compliant with correct performance of appropriate HEP as tolerated, updated as able    Time 1   Period Weeks   Status New   Target Date 04/24/17           PT Long Term Goals - 04/17/17 1624      PT LONG TERM GOAL #1   Title patient to experience pain in thoracic spine as being no more than 2/10 in order to improve QOL and functional task tolerance    Time 6   Period Weeks   Status New   Target Date 05/29/17     PT LONG TERM GOAL #2   Title Patient to report she has been able to sleep in her position of choice without increase in pain thoracic spine in order to improve QOL and sleep quality    Time 6   Period Weeks   Status New     PT LONG TERM GOAL #3   Title Patient to be able to tolerate wearing her bra for a full day without thoracic pain exacerbation in order to improve QOL and to assist in further supporting spine/reducing spine pain    Time 6   Status New     PT LONG TERM GOAL #4   Title Patient to be able to reach overhead without thoracic spine pain in order to improve QOL and tolerance to functional task performance    Time 6   Period Weeks   Status New               Plan - 04/26/17 1825    Clinical Impression Statement Reviewed form with HEP with cueing to improve form to address thoracic ROM rather than lumbar flexion with 3D thoracic excursion and form wtih corner stretch, pt verbalized understanding.  Pt educated on importance of posture and discussed assistance with lumbar roll.  Pt reports increased tenderness wiht rows.  EOS with manual soft tissue mobilization to address severe spasms Rt>Lt Rhomboids, erector spinae and levator scapula.  Discussion held and pt encouraged to get bra fitting to reduce pull on back.  Slight increased in pain  at EOS to 6/10.     Rehab Potential Fair  Clinical Impairments Affecting Rehab Potential (+) moderate outcomes with PT in the past, motivated; (-) obesity, chronicity of pain, idiopathic cause, chronic kyphotic posture    PT Frequency 2x / week   PT Duration 6 weeks   PT Treatment/Interventions ADLs/Self Care Home Management;Biofeedback;Cryotherapy;Electrical Stimulation;Iontophoresis 35m/ml Dexamethasone;Moist Heat;Traction;Ultrasound;Functional mobility training;Therapeutic activities;Therapeutic exercise;Balance training;Neuromuscular re-education;Patient/family education;Manual techniques;Passive range of motion;Dry needling;Taping   PT Next Visit Plan Review form with 3D excursion and corner stretch.  Continue heavy manual focus for pain and spasms relief.  Check for rotation thoracic vertebra/rotational MET as able, thoracic ROM and extension over towel roll.     PT Home Exercise Plan Eval (limited in scope due to high irritability of condition): increased time with heat on back, thoracic 3D excursions to tolerance, corner stretch       Patient will benefit from skilled therapeutic intervention in order to improve the following deficits and impairments:  Improper body mechanics, Pain, Increased muscle spasms, Postural dysfunction, Decreased range of motion, Hypomobility, Impaired UE functional use, Impaired flexibility  Visit Diagnosis: Pain in thoracic spine  Abnormal posture  Other symptoms and signs involving the musculoskeletal system     Problem List Patient Active Problem List   Diagnosis Date Noted  . Type 2 diabetes mellitus without complication, without long-term current use of insulin ([place] 01/27/2016  . Morbid obesity due to excess calories ([place] 01/27/2016  . Anxiety as acute reaction to exceptional stress 01/13/2015  . Attention deficit hyperactivity disorder (ADHD), 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  . Gout 01/20/2014  . GERD (gastroesophageal reflux disease) 01/20/2014  .  Essential hypertension, benign 01/20/2014  . Plantar fasciitis of left foot 09/19/2011   CIhor Austin LPTA; CBIS [phone] CAldona Lento10/19/2018, 6:37 PM  [address] [address]Phone: [phone]  Fax:  [phone] Name: Joanna KAMIYAMRN: [id number] of Birth: [birth year]

## 2017-04-29 ENCOUNTER — Encounter: Payer: Self-pay | Admitting: Family Medicine

## 2017-04-29 ENCOUNTER — Ambulatory Visit (INDEPENDENT_AMBULATORY_CARE_PROVIDER_SITE_OTHER): Payer: BLUE CROSS/BLUE SHIELD | Admitting: Family Medicine

## 2017-04-29 VITALS — BP 134/82 | Ht 64.5 in | Wt 274.0 lb

## 2017-04-29 DIAGNOSIS — M546 Pain in thoracic spine: Secondary | ICD-10-CM

## 2017-04-29 DIAGNOSIS — G8929 Other chronic pain: Secondary | ICD-10-CM | POA: Diagnosis not present

## 2017-04-29 DIAGNOSIS — M549 Dorsalgia, unspecified: Secondary | ICD-10-CM

## 2017-04-29 DIAGNOSIS — Z23 Encounter for immunization: Secondary | ICD-10-CM | POA: Diagnosis not present

## 2017-04-29 MED ORDER — MELOXICAM 15 MG PO TABS: 15.0000 mg | ORAL_TABLET | Freq: Every day | ORAL | 2 refills | Status: DC

## 2017-04-29 NOTE — Progress Notes (Signed)
   Subjective:    Patient ID: Joanna Reid, female    DOB: Jul 05, 1966, 51 y.o.   MRN: [id number] Patient arrives office apparently for follow-up with back pain but has other issues and concern she needs to discuss HPIFollow up back pain. Mid back pain. Doing physical therapy. Pt states she is getting some better.   Left foot pain for 9 days.   Overall bk pain is better, not taking meds during the dya  Still takes pain meds at night for redt  Foot pain,,  Pos hx of gout pos hx of plantar fasciitis medial ankle I  Pt works at [employer], notes upper mid back pain has overall improved. Physical therapy definitely seems to be helping.  Notes foot pain. History of plantar fasciitis. Pain primarily posterior near the ankle and rate radiating upwards. Positive history of gout also worried about this.  Also very concerned about her morbid obesity. Was unable to take most recent medicine offered for weight loss. Very frustrated by her weight. Feels she needs to get on and see a bariatric specialist in this regard. Discussed at length      Review of Systems No headache, no major weight loss or weight gain, no chest pain no back pain abdominal pain no change in bowel habits complete ROS otherwise negative     Objective:   Physical Exam  Alert and oriented, vitals reviewed and stable, NAD ENT-TM's and ext canals WNL bilat via otoscopic exam Soft palate, tonsils and post pharynx WNL via oropharyngeal exam Neck-symmetric, no masses; thyroid nonpalpable and nontender Pulmonary-no tachypnea or accessory muscle use; Clear without wheezes via auscultation Card--no abnrml murmurs, rhythm reg and rate WNL Carotid pulses symmetric, without bruits Morbid obesity present. BMI 46. Upper and mid bactenderness left greater than right. Left ankle tender medial joint line. Negative heel tenderness. Pulsations intact sensation intact     Assessment & Plan:  Impression 1 back pain overall improved.  Patient wishes to hold off an MRI at this time. Will maintain physical therapy  #2 ankle pain nonspecific. Can resume Modicon when necessary for that should help local measures discussed  #3 morbid obesity. Bariatric surgery potential discussed patient would like to open up in discussion with a specialist in this regard  We'll work on referral. Resume aerobic. Maintain physical therapy. Follow-up in several months for diabetes visit  Greater than 50% of this 25 minute face to face visit was spent in counseling and discussion and coordination of care regarding the above diagnosis/diagnosies

## 2017-04-30 ENCOUNTER — Ambulatory Visit (HOSPITAL_COMMUNITY): Payer: BLUE CROSS/BLUE SHIELD | Admitting: Physical Therapy

## 2017-04-30 ENCOUNTER — Telehealth (HOSPITAL_COMMUNITY): Payer: Self-pay | Admitting: Physical Therapy

## 2017-04-30 ENCOUNTER — Encounter: Payer: Self-pay | Admitting: Family Medicine

## 2017-04-30 NOTE — Telephone Encounter (Signed)
She got a flu shot yesterday and feels bad she is  not able to come in today.

## 2017-05-02 ENCOUNTER — Ambulatory Visit (HOSPITAL_COMMUNITY): Payer: BLUE CROSS/BLUE SHIELD | Admitting: Physical Therapy

## 2017-05-02 ENCOUNTER — Encounter (HOSPITAL_COMMUNITY): Payer: Self-pay | Admitting: Physical Therapy

## 2017-05-02 DIAGNOSIS — R29898 Other symptoms and signs involving the musculoskeletal system: Secondary | ICD-10-CM | POA: Diagnosis not present

## 2017-05-02 DIAGNOSIS — R293 Abnormal posture: Secondary | ICD-10-CM | POA: Diagnosis not present

## 2017-05-02 DIAGNOSIS — M546 Pain in thoracic spine: Secondary | ICD-10-CM | POA: Diagnosis not present

## 2017-05-02 NOTE — Patient Instructions (Addendum)
   Supine Pecs Stretch + thoracic extension  Lying with a tightly rolled up towel under your trunk (just above the elbows), bring arms directly overhead into a Y position, or with hands clasped behind the head.   You should feel a little extension stretch in the mid upper back, as well as a chest stretch almost near the armpits.   Gradually increase tolerance to prolonged time in position (2-5 minutes) as it can create some short term soreness when it is first started.     Modify this stretch by standing at your kitchen table or counter with your arms on the counter and weight equal between your legs and arms.  Reach through like the fellow in the picture so you feel your back stretching.   Hold 2-3 seconds, then return to neutral and then rotate the opposite direction.   Repeat 10 times each side, twice a day.

## 2017-05-02 NOTE — Therapy (Signed)
[place], [place], [address] Phone: [phone]   Fax:  [phone]  Physical Therapy Treatment  Patient Details  Name: Joanna Reid MRN: [id number] Date of Birth: 01/12/1966 Referring Provider: Baltazar Apo   Encounter Date: 05/02/2017      PT End of Session - 05/02/17 1644    Visit Number 3   Number of Visits 13   Date for PT Re-Evaluation 05/08/17   Authorization Type BC[employer] Other (90 visits per year, 13 used)   Authorization Time Period 04/17/17 to 05/29/17   Authorization - Visit Number 3   Authorization - Number of Visits 19   PT Start Time 5003   PT Stop Time 1635  moist heat    PT Time Calculation (min) 30 min   Activity Tolerance Patient tolerated treatment well   Behavior During Therapy [facility] for tasks assessed/performed      Past Medical History:  Diagnosis Date  . Acid reflux   . Allergic rhinitis   . Diabetes mellitus    type 2  . Gout   . Gout   . HBP (high blood pressure)   . Migraines     Past Surgical History:  Procedure Laterality Date  . CESAREAN SECTION    . EYE SURGERY    . HEMORRHOID SURGERY N/A 11/19/2012   Procedure: HEMORRHOIDECTOMY;  Surgeon: Jamesetta So, MD;  Location: [facility];  Service: General;  Laterality: N/A;  . kidney stones  1998  . LAPAROSCOPIC UNILATERAL SALPINGO OOPHERECTOMY  05/14/2012   Procedure: LAPAROSCOPIC UNILATERAL SALPINGO OOPHORECTOMY;  Surgeon: [name], MD;  Location: [facility];  Service: Gynecology;  Laterality: Right;  laparoscopic right salpingo-oophorectomy  . PARTIAL HYSTERECTOMY    . TONSILECTOMY, ADENOIDECTOMY, BILATERAL MYRINGOTOMY AND TUBES    . VESICOVAGINAL FISTULA CLOSURE W/ TAH      There were no vitals filed for this visit.      Subjective Assessment - 05/02/17 1606    Subjective Patient arrives stating that the massage last time hurt quite a bit but seemed to help as she can wear a bra today; she has kept up with her  routine and has been using heat regularly. She has been watching her activity closely. It is still a dull ache but she does not have to lay down to relieve it.    Pertinent History DM, gout, ADHD, obesity, hx of PT to L knee    Patient Stated Goals return to PLOF, reduce pain, be able to wear bra again    Currently in Pain? Yes   Pain Score 3    Pain Location Thoracic   Pain Orientation Right;Left   Pain Descriptors / Indicators Aching;Dull;Tightness   Pain Type Chronic pain   Pain Radiating Towards none    Pain Onset More than a month ago   Pain Frequency Constant   Aggravating Factors  being still for too long, being too active    Pain Relieving Factors resting, heat    Effect of Pain on Daily Activities moderate                          [facility] Adult PT Treatment/Exercise - 05/02/17 0001      Neck Exercises: Seated   Other Seated Exercise thoracic excursions x10 all directions      Neck Exercises: Supine   Other Supine Exercise thoracic extension stretch over towel roll  x2 minutes  Lumbar Exercises: Standing   Other Standing Lumbar Exercises modified quadruped thoracic rotations 1x15 B      Modalities   Modalities Moist Heat     Moist Heat Therapy   Number Minutes Moist Heat 8 Minutes   Moist Heat Location Other (comment)  thoracic spine      Manual Therapy   Manual Therapy Joint mobilization;Soft tissue mobilization   Manual therapy comments Manual complete separate than rest of tx   Joint Mobilization grade I-II PAs on thoracic spine    Soft tissue mobilization Prone position STM to L  thoracic region with spasms noted erector spinae, rhomboids and levator scapula                PT Education - 05/02/17 [place]    Education provided Yes   Education Details HEP update    Person(s) Educated Patient   Methods Explanation;Demonstration;Handout   Comprehension Verbalized understanding;Returned demonstration;Need further instruction           PT Short Term Goals - 04/17/17 1622      PT SHORT TERM GOAL #1   Title Patient to be able to maintain upright posture at least 70% of the time without external cues in order to combat habitual kyphotic posture and reduce pain    Time 3   Period Weeks   Status New   Target Date 05/08/17     PT SHORT TERM GOAL #2   Title Patient to experience thoracic spine pain as being no more than 4/10 at worst in order to improve QOL    Time 3   Period Weeks   Status New     PT SHORT TERM GOAL #3   Title Patient to be able to perform normal depth breaths and sneeze/cough without exacerbation in pain in order to show improvement in condition and QOL    Time 3   Period Weeks   Status New     PT SHORT TERM GOAL #4   Title Patient to be compliant with correct performance of appropriate HEP as tolerated, updated as able    Time 1   Period Weeks   Status New   Target Date 04/24/17           PT Long Term Goals - 04/17/17 1624      PT LONG TERM GOAL #1   Title patient to experience pain in thoracic spine as being no more than 2/10 in order to improve QOL and functional task tolerance    Time 6   Period Weeks   Status New   Target Date 05/29/17     PT LONG TERM GOAL #2   Title Patient to report she has been able to sleep in her position of choice without increase in pain thoracic spine in order to improve QOL and sleep quality    Time 6   Period Weeks   Status New     PT LONG TERM GOAL #3   Title Patient to be able to tolerate wearing her bra for a full day without thoracic pain exacerbation in order to improve QOL and to assist in further supporting spine/reducing spine pain    Time 6   Status New     PT LONG TERM GOAL #4   Title Patient to be able to reach overhead without thoracic spine pain in order to improve QOL and tolerance to functional task performance    Time 6   Period Weeks   Status New  Plan - 05/02/17 1645    Clinical Impression Statement  Patient arrives reporting that it seems to be slowly getting better, she is able to wear a bra now but has not been pushing herself in terms of activity. Began session with moist heat (not included in billing) as this is a major pain reliever for patient, otherwise focused on thoracic mobility and manual interventions this session.    Rehab Potential Fair   Clinical Impairments Affecting Rehab Potential (+) moderate outcomes with PT in the past, motivated; (-) obesity, chronicity of pain, idiopathic cause, chronic kyphotic posture    PT Frequency 2x / week   PT Duration 6 weeks   PT Treatment/Interventions ADLs/Self Care Home Management;Biofeedback;Cryotherapy;Electrical Stimulation;Iontophoresis 79m/ml Dexamethasone;Moist Heat;Traction;Ultrasound;Functional mobility training;Therapeutic activities;Therapeutic exercise;Balance training;Neuromuscular re-education;Patient/family education;Manual techniques;Passive range of motion;Dry needling;Taping   PT Next Visit Plan Review form with 3D excursion and corner stretch.  Continue heavy manual focus for pain and spasms relief.  Check for rotation thoracic vertebra/rotational MET as able, thoracic ROM and extension over towel roll.     PT Home Exercise Plan Eval (limited in scope due to high irritability of condition): increased time with heat on back, thoracic 3D excursions to tolerance, corner stretch  10/25- thoracic extension stretch, modified thoracic rotation quadruped stretch    Consulted and Agree with Plan of Care Patient      Patient will benefit from skilled therapeutic intervention in order to improve the following deficits and impairments:  Improper body mechanics, Pain, Increased muscle spasms, Postural dysfunction, Decreased range of motion, Hypomobility, Impaired UE functional use, Impaired flexibility  Visit Diagnosis: Pain in thoracic spine  Abnormal posture  Other symptoms and signs involving the musculoskeletal  system     Problem List Patient Active Problem List   Diagnosis Date Noted  . Type 2 diabetes mellitus without complication, without long-term current use of insulin ([facility] 01/27/2016  . Morbid obesity due to excess calories ([facility] 01/27/2016  . Anxiety as acute reaction to exceptional stress 01/13/2015  . Attention deficit hyperactivity disorder (ADHD), 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  . Gout 01/20/2014  . GERD (gastroesophageal reflux disease) 01/20/2014  . Essential hypertension, benign 01/20/2014  . Plantar fasciitis of left foot 09/19/2011    KDeniece ReePT, DPT [address] [address]Phone: [phone]  Fax:  [phone] Name: Joanna DUPUISMRN: [id number] of Birth: [birth year]

## 2017-05-06 ENCOUNTER — Encounter (HOSPITAL_COMMUNITY): Payer: Self-pay | Admitting: Physical Therapy

## 2017-05-06 ENCOUNTER — Ambulatory Visit (HOSPITAL_COMMUNITY): Payer: BLUE CROSS/BLUE SHIELD | Admitting: Physical Therapy

## 2017-05-06 DIAGNOSIS — R29898 Other symptoms and signs involving the musculoskeletal system: Secondary | ICD-10-CM | POA: Diagnosis not present

## 2017-05-06 DIAGNOSIS — M546 Pain in thoracic spine: Secondary | ICD-10-CM

## 2017-05-06 DIAGNOSIS — R293 Abnormal posture: Secondary | ICD-10-CM | POA: Diagnosis not present

## 2017-05-06 NOTE — Therapy (Signed)
Joanna Reid, [place], [address] Phone: [phone]   Fax:  [phone]  Physical Therapy Treatment (Re-Assessment)  Patient Details  Name: Joanna Reid MRN: [id number] Date of Birth: December 21, 1965 Referring Provider: Baltazar Apo   Encounter Date: 05/06/2017      PT End of Session - 05/06/17 1742    Visit Number 4   Number of Visits 13   Date for PT Re-Evaluation 05/27/17   Authorization Type [employer] Other (90 visits per year, 13 used)   Authorization Time Period 04/17/17 to 05/29/17   Authorization - Visit Number 4   Authorization - Number of Visits 2   PT Start Time 0623   PT Stop Time 1732   PT Time Calculation (min) 38 min   Activity Tolerance Patient tolerated treatment well   Behavior During Therapy [facility] for tasks assessed/performed      Past Medical History:  Diagnosis Date  . Acid reflux   . Allergic rhinitis   . Diabetes mellitus    type 2  . Gout   . Gout   . HBP (high blood pressure)   . Migraines     Past Surgical History:  Procedure Laterality Date  . CESAREAN SECTION    . EYE SURGERY    . HEMORRHOID SURGERY N/A 11/19/2012   Procedure: HEMORRHOIDECTOMY;  Surgeon: Jamesetta So, MD;  Location: [facility];  Service: General;  Laterality: N/A;  . kidney stones  1998  . LAPAROSCOPIC UNILATERAL SALPINGO OOPHERECTOMY  05/14/2012   Procedure: LAPAROSCOPIC UNILATERAL SALPINGO OOPHORECTOMY;  Surgeon: [name], MD;  Location: [facility];  Service: Gynecology;  Laterality: Right;  laparoscopic right salpingo-oophorectomy  . PARTIAL HYSTERECTOMY    . TONSILECTOMY, ADENOIDECTOMY, BILATERAL MYRINGOTOMY AND TUBES    . VESICOVAGINAL FISTULA CLOSURE W/ TAH      There were no vitals filed for this visit.      Subjective Assessment - 05/06/17 1647    Subjective Patient arrives stating she feels she is better, she can wear her bra again, she can breathe much easier, she sometimes has worse days but it  seems better overall. She rates herself at 75%; her biggest concern is just continuing to get better. The heat helped last time, the massage and stretching has been helping too.    Pertinent History DM, gout, ADHD, obesity, hx of PT to L knee    How long can you sit comfortably? 10/29- much better    How long can you stand comfortably? 10/29- she still has to take chairs to games due to pain    How long can you walk comfortably? 10/29 unlimited    Patient Stated Goals return to PLOF, reduce pain, be able to wear bra again    Currently in Pain? Yes   Pain Score 2    Pain Location Thoracic   Pain Orientation Right;Left   Pain Descriptors / Indicators Aching;Dull;Tightness   Pain Type Chronic pain            [facility] PT Assessment - 05/06/17 0001      AROM   Lumbar Flexion WFL painfree    Thoracic Flexion WNL    Thoracic Extension approx 40% limited    Thoracic - Right Side Bend WNL mild pain    Thoracic - Left Side Bend WNL    Thoracic - Right Rotation moderate limitation    Thoracic - Left Rotation WNL      Palpation   Palpation comment ongoing  severe thoracic paraspinal muscle spasm; mild improvement in thoracic PA mobility but still very tender                      [facility] Adult PT Treatment/Exercise - 05/06/17 0001      Lumbar Exercises: Standing   Other Standing Lumbar Exercises modified quadruped: mad cat/old horse, thoracic rotations,, lateral trunk reaches      Moist Heat Therapy   Number Minutes Moist Heat 8 Minutes   Moist Heat Location Other (comment)  thoracic spine, not included in billing      Manual Therapy   Manual Therapy Soft tissue mobilization   Manual therapy comments Manual complete separate than rest of tx   Soft tissue mobilization B thoracic paraspinals prone                 PT Education - 05/06/17 1740    Education provided Yes   Education Details progress wth skilled PT services, POC moving forward, unlikely that scoliosis  that has been present for life is the main cuase of current pain, importance of mobility and mechanics in Thoracic spine    Person(s) Educated Patient   Methods Explanation   Comprehension Verbalized understanding          PT Short Term Goals - 05/06/17 1658      PT SHORT TERM GOAL #1   Title Patient to be able to maintain upright posture at least 70% of the time without external cues in order to combat habitual kyphotic posture and reduce pain    Baseline 10/29- ongoing postural deficits    Time 3   Period Weeks   Status On-going     PT SHORT TERM GOAL #2   Title Patient to experience thoracic spine pain as being no more than 4/10 at worst in order to improve QOL    Baseline 10/29- 3-4/10 on average; can be very tender with PAs by PT    Time 3   Period Weeks   Status Achieved     PT SHORT TERM GOAL #3   Title Patient to be able to perform normal depth breaths and sneeze/cough without exacerbation in pain in order to show improvement in condition and QOL    Baseline 10/29- ongiong, can still flare up with these actions    Time 3   Period Weeks   Status On-going     PT SHORT TERM GOAL #4   Title Patient to be compliant with correct performance of appropriate HEP as tolerated, updated as able    Baseline 10/29- compliant    Time 1   Period Weeks   Status Achieved           PT Long Term Goals - 05/06/17 1700      PT LONG TERM GOAL #1   Title patient to experience pain in thoracic spine as being no more than 2/10 in order to improve QOL and functional task tolerance    Baseline 10/29- 3-4/10   Time 6   Period Weeks   Status On-going     PT LONG TERM GOAL #2   Title Patient to report she has been able to sleep in her position of choice without increase in pain thoracic spine in order to improve QOL and sleep quality    Baseline 10/29- still cannot sleep on R side, easier to sleep on stomach    Time 6   Period Weeks   Status Partially Met  PT LONG TERM GOAL #3    Title Patient to be able to tolerate wearing her bra for a full day without thoracic pain exacerbation in order to improve QOL and to assist in further supporting spine/reducing spine pain    Baseline 10/29- able to do so    Time 6   Period Weeks   Status Achieved     PT LONG TERM GOAL #4   Title Patient to be able to reach overhead without thoracic spine pain in order to improve QOL and tolerance to functional task performance    Baseline 10/29- can do with one hand, two hands hurts    Time 6   Period Weeks   Status Partially Met               Plan - 05/06/17 1743    Clinical Impression Statement Re-assessment performed today. Patient appears to be making moderate progress with skilled PT services, and shows improved tolerance to tasks such as reaching, wearing a bra throughout the day, sleeping, and functional task performance. She does continue to experience ongoing thoracic pain especially with certain positions and sleeping, as well as overhead reaching and general postural deviations. Recommend continuation of skilled PT services to reduce pain and address remaining functional deficits moving forward.    Rehab Potential Fair   Clinical Impairments Affecting Rehab Potential (+) moderate outcomes with PT in the past, motivated; (-) obesity, chronicity of pain, idiopathic cause, chronic kyphotic posture    PT Frequency 2x / week   PT Duration 3 weeks   PT Treatment/Interventions ADLs/Self Care Home Management;Biofeedback;Cryotherapy;Electrical Stimulation;Iontophoresis 91m/ml Dexamethasone;Moist Heat;Traction;Ultrasound;Functional mobility training;Therapeutic activities;Therapeutic exercise;Balance training;Neuromuscular re-education;Patient/family education;Manual techniques;Passive range of motion;Dry needling;Taping   PT Next Visit Plan continue moist heat and manaul thoracic spine; contniue mobility work, introduce postural strengthening   PT Home Exercise Plan Eval (limited  in scope due to high irritability of condition): increased time with heat on back, thoracic 3D excursions to tolerance, corner stretch  10/25- thoracic extension stretch, modified thoracic rotation quadruped stretch    Consulted and Agree with Plan of Care Patient      Patient will benefit from skilled therapeutic intervention in order to improve the following deficits and impairments:  Improper body mechanics, Pain, Increased muscle spasms, Postural dysfunction, Decreased range of motion, Hypomobility, Impaired UE functional use, Impaired flexibility  Visit Diagnosis: Pain in thoracic spine  Abnormal posture  Other symptoms and signs involving the musculoskeletal system     Problem List Patient Active Problem List   Diagnosis Date Noted  . Type 2 diabetes mellitus without complication, without long-term current use of insulin ([facility] 01/27/2016  . Morbid obesity due to excess calories ([facility] 01/27/2016  . Anxiety as acute reaction to exceptional stress 01/13/2015  . Attention deficit hyperactivity disorder (ADHD), 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  . Gout 01/20/2014  . GERD (gastroesophageal reflux disease) 01/20/2014  . Essential hypertension, benign 01/20/2014  . Plantar fasciitis of left foot 09/19/2011    KDeniece ReePT, DPT [address] [address]Phone: [phone]  Fax:  [phone] Name: Joanna MUTCHLERMRN: [id number] of Birth: [birth year]

## 2017-05-08 ENCOUNTER — Ambulatory Visit (HOSPITAL_COMMUNITY): Payer: BLUE CROSS/BLUE SHIELD | Admitting: Physical Therapy

## 2017-05-08 ENCOUNTER — Encounter (HOSPITAL_COMMUNITY): Payer: Self-pay | Admitting: Physical Therapy

## 2017-05-08 DIAGNOSIS — M546 Pain in thoracic spine: Secondary | ICD-10-CM

## 2017-05-08 DIAGNOSIS — R29898 Other symptoms and signs involving the musculoskeletal system: Secondary | ICD-10-CM | POA: Diagnosis not present

## 2017-05-08 DIAGNOSIS — R293 Abnormal posture: Secondary | ICD-10-CM

## 2017-05-08 NOTE — Therapy (Signed)
[place], [place], [address] Phone: [phone]   Fax:  [phone]  Physical Therapy Treatment  Patient Details  Name: Joanna Reid MRN: [id number] Date of Birth: 1965/07/28 Referring Provider: Baltazar Apo   Encounter Date: 05/08/2017      PT End of Session - 05/08/17 1753    Visit Number 5   Number of Visits 13   Date for PT Re-Evaluation 05/27/17   Authorization Type [employer] Other (90 visits per year, 13 used)   Authorization Time Period 04/17/17 to 05/29/17   Authorization - Visit Number 5   Authorization - Number of Visits 64   PT Start Time 3710   PT Stop Time 1641   PT Time Calculation (min) 38 min   Activity Tolerance Patient tolerated treatment well   Behavior During Therapy [facility] for tasks assessed/performed      Past Medical History:  Diagnosis Date  . Acid reflux   . Allergic rhinitis   . Diabetes mellitus    type 2  . Gout   . Gout   . HBP (high blood pressure)   . Migraines     Past Surgical History:  Procedure Laterality Date  . CESAREAN SECTION    . EYE SURGERY    . HEMORRHOID SURGERY N/A 11/19/2012   Procedure: HEMORRHOIDECTOMY;  Surgeon: Jamesetta So, MD;  Location: [facility];  Service: General;  Laterality: N/A;  . kidney stones  1998  . LAPAROSCOPIC UNILATERAL SALPINGO OOPHERECTOMY  05/14/2012   Procedure: LAPAROSCOPIC UNILATERAL SALPINGO OOPHORECTOMY;  Surgeon: [name], MD;  Location: [facility];  Service: Gynecology;  Laterality: Right;  laparoscopic right salpingo-oophorectomy  . PARTIAL HYSTERECTOMY    . TONSILECTOMY, ADENOIDECTOMY, BILATERAL MYRINGOTOMY AND TUBES    . VESICOVAGINAL FISTULA CLOSURE W/ TAH      There were no vitals filed for this visit.      Subjective Assessment - 05/08/17 1600    Subjective Patient arrives stating that the day after her last PT session she had hard time sleeping due to being quite sore, it was a pretty superficial pain and not  deep however.    Pertinent History DM, gout, ADHD, obesity, hx of PT to L knee    Patient Stated Goals return to PLOF, reduce pain, be able to wear bra again    Currently in Pain? Yes   Pain Score 2    Pain Location Thoracic   Pain Orientation Right   Pain Descriptors / Indicators Aching;Dull;Tightness                         [facility] Adult PT Treatment/Exercise - 05/08/17 0001      Neck Exercises: Supine   Other Supine Exercise scapular retractions with 3 second holds 1x15; shoulder rolls 1x15 (up, back, down)   Other Supine Exercise scapular depression 1x15, cues for form      Lumbar Exercises: Supine   Other Supine Lumbar Exercises thoracic extersion stretch over towel roll x3 minutes; thoracic extension with UE overhead movements 1x15 for increased thoracic extension      Lumbar Exercises: Quadruped   Madcat/Old Horse Limitations modified quadruped at chair: mad cat/old horse 1x15; thoracic rotation 1x15 B; lateral thoracic reaches 1x15 B      Moist Heat Therapy   Number Minutes Moist Heat 8 Minutes   Moist Heat Location Other (comment)  thoracic spine prior to session      Manual Therapy  Manual Therapy Soft tissue mobilization   Manual therapy comments Manual complete separate than rest of tx   Joint Mobilization grade I-II PAs on thoracic spine    Soft tissue mobilization B thoracic paraspinals prone      Neck Exercises: Stretches   Corner Stretch 3 reps;30 seconds                PT Education - 05/08/17 1752    Education provided Yes   Education Details likely soreness following increased amount of exercise and manual today    Person(s) Educated Patient   Methods Explanation   Comprehension Verbalized understanding          PT Short Term Goals - 05/06/17 1658      PT SHORT TERM GOAL #1   Title Patient to be able to maintain upright posture at least 70% of the time without external cues in order to combat habitual kyphotic posture and  reduce pain    Baseline 10/29- ongoing postural deficits    Time 3   Period Weeks   Status On-going     PT SHORT TERM GOAL #2   Title Patient to experience thoracic spine pain as being no more than 4/10 at worst in order to improve QOL    Baseline 10/29- 3-4/10 on average; can be very tender with PAs by PT    Time 3   Period Weeks   Status Achieved     PT SHORT TERM GOAL #3   Title Patient to be able to perform normal depth breaths and sneeze/cough without exacerbation in pain in order to show improvement in condition and QOL    Baseline 10/29- ongiong, can still flare up with these actions    Time 3   Period Weeks   Status On-going     PT SHORT TERM GOAL #4   Title Patient to be compliant with correct performance of appropriate HEP as tolerated, updated as able    Baseline 10/29- compliant    Time 1   Period Weeks   Status Achieved           PT Long Term Goals - 05/06/17 1700      PT LONG TERM GOAL #1   Title patient to experience pain in thoracic spine as being no more than 2/10 in order to improve QOL and functional task tolerance    Baseline 10/29- 3-4/10   Time 6   Period Weeks   Status On-going     PT LONG TERM GOAL #2   Title Patient to report she has been able to sleep in her position of choice without increase in pain thoracic spine in order to improve QOL and sleep quality    Baseline 10/29- still cannot sleep on R side, easier to sleep on stomach    Time 6   Period Weeks   Status Partially Met     PT LONG TERM GOAL #3   Title Patient to be able to tolerate wearing her bra for a full day without thoracic pain exacerbation in order to improve QOL and to assist in further supporting spine/reducing spine pain    Baseline 10/29- able to do so    Time 6   Period Weeks   Status Achieved     PT LONG TERM GOAL #4   Title Patient to be able to reach overhead without thoracic spine pain in order to improve QOL and tolerance to functional task performance     Baseline 10/29- can do  with one hand, two hands hurts    Time 6   Period Weeks   Status Partially Met               Plan - 05/08/17 1753    Clinical Impression Statement Placed patient on moist heat for approximately 8 minutes prior to formal start of session; otherwise focused on thoracic mobility, especially extension and rotation, as well as introduction of postural strengthening this session. Ended with STM and joint mobilizations as tolerated for mobility and pain control this session. Patient educated regarding possibility of increased soreness following introduction of new exercises and considering manual performed today.    Rehab Potential Fair   Clinical Impairments Affecting Rehab Potential (+) moderate outcomes with PT in the past, motivated; (-) obesity, chronicity of pain, idiopathic cause, chronic kyphotic posture    PT Frequency 2x / week   PT Duration 3 weeks   PT Treatment/Interventions ADLs/Self Care Home Management;Biofeedback;Cryotherapy;Electrical Stimulation;Iontophoresis 43m/ml Dexamethasone;Moist Heat;Traction;Ultrasound;Functional mobility training;Therapeutic activities;Therapeutic exercise;Balance training;Neuromuscular re-education;Patient/family education;Manual techniques;Passive range of motion;Dry needling;Taping   PT Next Visit Plan continue moist heat and manaul thoracic spine; contniue mobility work, continue postural strengthening   PT Home Exercise Plan Eval (limited in scope due to high irritability of condition): increased time with heat on back, thoracic 3D excursions to tolerance, corner stretch  10/25- thoracic extension stretch, modified thoracic rotation quadruped stretch    Consulted and Agree with Plan of Care Patient      Patient will benefit from skilled therapeutic intervention in order to improve the following deficits and impairments:  Improper body mechanics, Pain, Increased muscle spasms, Postural dysfunction, Decreased range of motion,  Hypomobility, Impaired UE functional use, Impaired flexibility  Visit Diagnosis: Pain in thoracic spine  Abnormal posture  Other symptoms and signs involving the musculoskeletal system     Problem List Patient Active Problem List   Diagnosis Date Noted  . Type 2 diabetes mellitus without complication, without long-term current use of insulin ([facility] 01/27/2016  . Morbid obesity due to excess calories ([facility] 01/27/2016  . Anxiety as acute reaction to exceptional stress 01/13/2015  . Attention deficit hyperactivity disorder (ADHD), 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  . Gout 01/20/2014  . GERD (gastroesophageal reflux disease) 01/20/2014  . Essential hypertension, benign 01/20/2014  . Plantar fasciitis of left foot 09/19/2011    KDeniece ReePT, DPT [address] [address]Phone: [phone]  Fax:  [phone] Name: RRIVKA BAUNEMRN: [id number] of Birth: 210-23-1967

## 2017-05-12 ENCOUNTER — Other Ambulatory Visit: Payer: Self-pay | Admitting: Nurse Practitioner

## 2017-05-13 ENCOUNTER — Ambulatory Visit (HOSPITAL_COMMUNITY): Payer: BLUE CROSS/BLUE SHIELD | Admitting: Physical Therapy

## 2017-05-13 ENCOUNTER — Other Ambulatory Visit: Payer: Self-pay | Admitting: Family Medicine

## 2017-05-13 ENCOUNTER — Telehealth (HOSPITAL_COMMUNITY): Payer: Self-pay | Admitting: Physical Therapy

## 2017-05-13 NOTE — Telephone Encounter (Signed)
She has to work today and can not come, request to be put on wait list between 3:15 & 5:30

## 2017-05-14 DIAGNOSIS — E119 Type 2 diabetes mellitus without complications: Secondary | ICD-10-CM | POA: Diagnosis not present

## 2017-05-14 DIAGNOSIS — Z008 Encounter for other general examination: Secondary | ICD-10-CM | POA: Diagnosis not present

## 2017-05-14 DIAGNOSIS — E559 Vitamin D deficiency, unspecified: Secondary | ICD-10-CM | POA: Diagnosis not present

## 2017-05-14 DIAGNOSIS — E782 Mixed hyperlipidemia: Secondary | ICD-10-CM | POA: Diagnosis not present

## 2017-05-14 DIAGNOSIS — E669 Obesity, unspecified: Secondary | ICD-10-CM | POA: Diagnosis not present

## 2017-05-16 ENCOUNTER — Ambulatory Visit (HOSPITAL_COMMUNITY): Payer: BLUE CROSS/BLUE SHIELD | Attending: Family Medicine | Admitting: Physical Therapy

## 2017-05-16 DIAGNOSIS — M546 Pain in thoracic spine: Secondary | ICD-10-CM | POA: Diagnosis not present

## 2017-05-16 DIAGNOSIS — R293 Abnormal posture: Secondary | ICD-10-CM | POA: Insufficient documentation

## 2017-05-16 DIAGNOSIS — R29898 Other symptoms and signs involving the musculoskeletal system: Secondary | ICD-10-CM | POA: Diagnosis not present

## 2017-05-16 NOTE — Therapy (Signed)
[place], [place], [address] Phone: [phone]   Fax:  [phone]  Physical Therapy Treatment  Patient Details  Name: ESABELLA STOCKINGER MRN: [id number] Date of Birth: September 17, 1965 Referring Provider: Baltazar Apo    Encounter Date: 05/16/2017  PT End of Session - 05/16/17 1755    Visit Number  6    Number of Visits  13    Date for PT Re-Evaluation  05/27/17    Authorization Type  [employer] Other (90 visits per year, 13 used)    Authorization Time Period  04/17/17 to 05/29/17    Authorization - Visit Number  6    Authorization - Number of Visits  33    PT Start Time  4010    PT Stop Time  2725    PT Time Calculation (min)  41 min    Activity Tolerance  Patient tolerated treatment well    Behavior During Therapy  [facility] for tasks assessed/performed       Past Medical History:  Diagnosis Date  . Acid reflux   . Allergic rhinitis   . Diabetes mellitus    type 2  . Gout   . Gout   . HBP (high blood pressure)   . Migraines     Past Surgical History:  Procedure Laterality Date  . CESAREAN SECTION    . EYE SURGERY    . kidney stones  1998  . PARTIAL HYSTERECTOMY    . TONSILECTOMY, ADENOIDECTOMY, BILATERAL MYRINGOTOMY AND TUBES    . VESICOVAGINAL FISTULA CLOSURE W/ TAH      There were no vitals filed for this visit.  Subjective Assessment - 05/16/17 1752    Subjective  Pt reports she is doing fair.  States the pain is still there.  Compliance reported with her HEP    Currently in Pain?  Yes    Pain Score  2     Pain Location  Thoracic    Pain Orientation  Right    Pain Descriptors / Indicators  Aching;Tightness    Pain Type  Chronic pain                      [facility] Adult PT Treatment/Exercise - 05/16/17 0001      Lumbar Exercises: Standing   Other Standing Lumbar Exercises  postural 3 with blue Tband 10 reps      Lumbar Exercises: Seated   Other Seated Lumbar Exercises  thoracic  excursions 5 reps each      Moist Heat Therapy   Number Minutes Moist Heat  8 Minutes    Moist Heat Location  Other (comment)      Manual Therapy   Manual Therapy  Soft tissue mobilization    Manual therapy comments  Manual complete separate than rest of tx    Soft tissue mobilization  B thoracic paraspinals prone                PT Short Term Goals - 05/06/17 1658      PT SHORT TERM GOAL #1   Title  Patient to be able to maintain upright posture at least 70% of the time without external cues in order to combat habitual kyphotic posture and reduce pain     Baseline  10/29- ongoing postural deficits     Time  3    Period  Weeks    Status  On-going      PT SHORT TERM GOAL #  2   Title  Patient to experience thoracic spine pain as being no more than 4/10 at worst in order to improve QOL     Baseline  10/29- 3-4/10 on average; can be very tender with PAs by PT     Time  3    Period  Weeks    Status  Achieved      PT SHORT TERM GOAL #3   Title  Patient to be able to perform normal depth breaths and sneeze/cough without exacerbation in pain in order to show improvement in condition and QOL     Baseline  10/29- ongiong, can still flare up with these actions     Time  3    Period  Weeks    Status  On-going      PT SHORT TERM GOAL #4   Title  Patient to be compliant with correct performance of appropriate HEP as tolerated, updated as able     Baseline  10/29- compliant     Time  1    Period  Weeks    Status  Achieved        PT Long Term Goals - 05/06/17 1700      PT LONG TERM GOAL #1   Title  patient to experience pain in thoracic spine as being no more than 2/10 in order to improve QOL and functional task tolerance     Baseline  10/29- 3-4/10    Time  6    Period  Weeks    Status  On-going      PT LONG TERM GOAL #2   Title  Patient to report she has been able to sleep in her position of choice without increase in pain thoracic spine in order to improve QOL and  sleep quality     Baseline  10/29- still cannot sleep on R side, easier to sleep on stomach     Time  6    Period  Weeks    Status  Partially Met      PT LONG TERM GOAL #3   Title  Patient to be able to tolerate wearing her bra for a full day without thoracic pain exacerbation in order to improve QOL and to assist in further supporting spine/reducing spine pain     Baseline  10/29- able to do so     Time  6    Period  Weeks    Status  Achieved      PT LONG TERM GOAL #4   Title  Patient to be able to reach overhead without thoracic spine pain in order to improve QOL and tolerance to functional task performance     Baseline  10/29- can do with one hand, two hands hurts     Time  6    Period  Weeks    Status  Partially Met            Plan - 05/16/17 1756    Clinical Impression Statement  Began session with 8 minutes in prone prior to manual and exercise instruction as reports this is benficial.  Pt continues to have tightness in thoracic region, however much reduced from initial visit.  Pt instructed with postural sterngtheing esercises using theraband and issued some for HEP    Rehab Potential  Fair    Clinical Impairments Affecting Rehab Potential  (+) moderate outcomes with PT in the past, motivated; (-) obesity, chronicity of pain, idiopathic cause, chronic kyphotic posture  PT Frequency  2x / week    PT Duration  3 weeks    PT Treatment/Interventions  ADLs/Self Care Home Management;Biofeedback;Cryotherapy;Electrical Stimulation;Iontophoresis 85m/ml Dexamethasone;Moist Heat;Traction;Ultrasound;Functional mobility training;Therapeutic activities;Therapeutic exercise;Balance training;Neuromuscular re-education;Patient/family education;Manual techniques;Passive range of motion;Dry needling;Taping    PT Next Visit Plan  continue moist heat and manaul thoracic spine; contniue mobility work, continue postural strengthening.  Follow up with theraband postural exercises to ensure  correct form.     PT Home Exercise Plan  Eval (limited in scope due to high irritability of condition): increased time with heat on back, thoracic 3D excursions to tolerance, corner stretch  10/25- thoracic extension stretch, modified thoracic rotation quadruped stretch     Consulted and Agree with Plan of Care  Patient       Patient will benefit from skilled therapeutic intervention in order to improve the following deficits and impairments:  Improper body mechanics, Pain, Increased muscle spasms, Postural dysfunction, Decreased range of motion, Hypomobility, Impaired UE functional use, Impaired flexibility  Visit Diagnosis: Pain in thoracic spine  Abnormal posture  Other symptoms and signs involving the musculoskeletal system     Problem List Patient Active Problem List   Diagnosis Date Noted  . Type 2 diabetes mellitus without complication, without long-term current use of insulin ([facility] 01/27/2016  . Morbid obesity due to excess calories ([facility] 01/27/2016  . Anxiety as acute reaction to exceptional stress 01/13/2015  . Attention deficit hyperactivity disorder (ADHD), 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  . Gout 01/20/2014  . GERD (gastroesophageal reflux disease) 01/20/2014  . Essential hypertension, benign 01/20/2014  . Plantar fasciitis of left foot 09/19/2011   ATeena Irani PTA/CLT [phone] FTeena Irani11/02/2017, 5:58 PM  [place] [address]Phone: [phone]  Fax:  [phone] Name: RREHAM SLABAUGHMRN: [id number] of Birth: [birth year]

## 2017-05-20 ENCOUNTER — Other Ambulatory Visit: Payer: Self-pay

## 2017-05-20 ENCOUNTER — Ambulatory Visit (HOSPITAL_COMMUNITY): Payer: BLUE CROSS/BLUE SHIELD | Admitting: Physical Therapy

## 2017-05-20 DIAGNOSIS — R29898 Other symptoms and signs involving the musculoskeletal system: Secondary | ICD-10-CM

## 2017-05-20 DIAGNOSIS — R293 Abnormal posture: Secondary | ICD-10-CM | POA: Diagnosis not present

## 2017-05-20 DIAGNOSIS — M546 Pain in thoracic spine: Secondary | ICD-10-CM

## 2017-05-20 MED ORDER — LOSARTAN POTASSIUM 100 MG PO TABS: 100.0000 mg | ORAL_TABLET | Freq: Every day | ORAL | 1 refills | Status: DC

## 2017-05-20 NOTE — Therapy (Signed)
[place] [facility] [address] [place], , [address] Phone: [phone]   Fax:  [phone]  Physical Therapy Treatment  Patient Details  Name: Joanna Reid MRN: [id number] Date of Birth: 02/27/1966 Referring Provider: William Luking    Encounter Date: 05/20/2017  PT End of Session - 05/20/17 1753    Visit Number  7    Number of Visits  13    Date for PT Re-Evaluation  05/27/17    Authorization Type  BCBS Other (90 visits per year, 13 used)    Authorization Time Period  04/17/17 to 05/29/17    Authorization - Visit Number  7    Authorization - Number of Visits  77    PT Start Time  1604    PT Stop Time  1645    PT Time Calculation (min)  41 min    Activity Tolerance  Patient tolerated treatment well    Behavior During Therapy  [facility] for tasks assessed/performed       Past Medical History:  Diagnosis Date  . Acid reflux   . Allergic rhinitis   . Diabetes mellitus    type 2  . Gout   . Gout   . HBP (high blood pressure)   . Migraines     Past Surgical History:  Procedure Laterality Date  . CESAREAN SECTION    . EYE SURGERY    . kidney stones  1998  . PARTIAL HYSTERECTOMY    . TONSILECTOMY, ADENOIDECTOMY, BILATERAL MYRINGOTOMY AND TUBES    . VESICOVAGINAL FISTULA CLOSURE W/ TAH      There were no vitals filed for this visit.  Subjective Assessment - 05/20/17 1808    Subjective  Pt states her pain is about the same at 3/10.      Currently in Pain?  Yes    Pain Score  3     Pain Location  Thoracic    Pain Orientation  Mid    Pain Descriptors / Indicators  Aching;Tightness                      [facility] Adult PT Treatment/Exercise - 05/20/17 0001      Lumbar Exercises: Standing   Other Standing Lumbar Exercises  postural 3 with blue Tband 10 reps      Lumbar Exercises: Seated   Other Seated Lumbar Exercises  thoracic excursions 5 reps each      Moist Heat Therapy   Number Minutes Moist Heat  8  Minutes    Moist Heat Location  Other (comment)      Manual Therapy   Manual Therapy  Soft tissue mobilization    Manual therapy comments  Manual complete separate than rest of tx    Soft tissue mobilization  B thoracic paraspinals prone                PT Short Term Goals - 05/06/17 1658      PT SHORT TERM GOAL #1   Title  Patient to be able to maintain upright posture at least 70% of the time without external cues in order to combat habitual kyphotic posture and reduce pain     Baseline  10/29- ongoing postural deficits     Time  3    Period  Weeks    Status  On-going      PT SHORT TERM GOAL #2   Title  Patient to experience thoracic spine pain as being no more   than 4/10 at worst in order to improve QOL     Baseline  10/29- 3-4/10 on average; can be very tender with PAs by PT     Time  3    Period  Weeks    Status  Achieved      PT SHORT TERM GOAL #3   Title  Patient to be able to perform normal depth breaths and sneeze/cough without exacerbation in pain in order to show improvement in condition and QOL     Baseline  10/29- ongiong, can still flare up with these actions     Time  3    Period  Weeks    Status  On-going      PT SHORT TERM GOAL #4   Title  Patient to be compliant with correct performance of appropriate HEP as tolerated, updated as able     Baseline  10/29- compliant     Time  1    Period  Weeks    Status  Achieved        PT Long Term Goals - 05/06/17 1700      PT LONG TERM GOAL #1   Title  patient to experience pain in thoracic spine as being no more than 2/10 in order to improve QOL and functional task tolerance     Baseline  10/29- 3-4/10    Time  6    Period  Weeks    Status  On-going      PT LONG TERM GOAL #2   Title  Patient to report she has been able to sleep in her position of choice without increase in pain thoracic spine in order to improve QOL and sleep quality     Baseline  10/29- still cannot sleep on R side, easier to sleep on  stomach     Time  6    Period  Weeks    Status  Partially Met      PT LONG TERM GOAL #3   Title  Patient to be able to tolerate wearing her bra for a full day without thoracic pain exacerbation in order to improve QOL and to assist in further supporting spine/reducing spine pain     Baseline  10/29- able to do so     Time  6    Period  Weeks    Status  Achieved      PT LONG TERM GOAL #4   Title  Patient to be able to reach overhead without thoracic spine pain in order to improve QOL and tolerance to functional task performance     Baseline  10/29- can do with one hand, two hands hurts     Time  6    Period  Weeks    Status  Partially Met            Plan - 05/20/17 1753    Clinical Impression Statement  PT able to display correct form and control with  theraband exercise sthis session.   Continued with moist heat prior to manual to help decrease muscle spasm and improve relaxation.  Pt reported no significant change at end of session with pain still at 3/10.    Rehab Potential  Fair    Clinical Impairments Affecting Rehab Potential  (+) moderate outcomes with PT in the past, motivated; (-) obesity, chronicity of pain, idiopathic cause, chronic kyphotic posture     PT Frequency  2x / week    PT Duration  3 weeks  PT Treatment/Interventions  ADLs/Self Care Home Management;Biofeedback;Cryotherapy;Electrical Stimulation;Iontophoresis 4mg/ml Dexamethasone;Moist Heat;Traction;Ultrasound;Functional mobility training;Therapeutic activities;Therapeutic exercise;Balance training;Neuromuscular re-education;Patient/family education;Manual techniques;Passive range of motion;Dry needling;Taping    PT Next Visit Plan  continue moist heat and manaul thoracic spine; contniue mobility work, continue postural strengthening.      PT Home Exercise Plan  Eval (limited in scope due to high irritability of condition): increased time with heat on back, thoracic 3D excursions to tolerance, corner stretch   10/25- thoracic extension stretch, modified thoracic rotation quadruped stretch     Consulted and Agree with Plan of Care  Patient       Patient will benefit from skilled therapeutic intervention in order to improve the following deficits and impairments:  Improper body mechanics, Pain, Increased muscle spasms, Postural dysfunction, Decreased range of motion, Hypomobility, Impaired UE functional use, Impaired flexibility  Visit Diagnosis: Pain in thoracic spine  Abnormal posture  Other symptoms and signs involving the musculoskeletal system     Problem List Patient Active Problem List   Diagnosis Date Noted  . Type 2 diabetes mellitus without complication, without long-term current use of insulin ([facility]) 01/27/2016  . Morbid obesity due to excess calories ([facility]) 01/27/2016  . Anxiety as acute reaction to exceptional stress 01/13/2015  . Attention deficit hyperactivity disorder (ADHD), 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  . Gout 01/20/2014  . GERD (gastroesophageal reflux disease) 01/20/2014  . Essential hypertension, benign 01/20/2014  . Plantar fasciitis of left foot 09/19/2011   Amy B Frazier, PTA/CLT [phone]  Frazier, Amy B 05/20/2017, 6:08 PM  [facility] [address] , [place], [address] Phone: [phone]   Fax:  [phone]  Name: Joanna Reid MRN: [id number] Date of Birth: 10/16/1965   

## 2017-05-22 ENCOUNTER — Ambulatory Visit (HOSPITAL_COMMUNITY): Payer: BLUE CROSS/BLUE SHIELD | Admitting: Physical Therapy

## 2017-05-22 ENCOUNTER — Telehealth (HOSPITAL_COMMUNITY): Payer: Self-pay | Admitting: Family Medicine

## 2017-05-22 NOTE — Telephone Encounter (Signed)
05/22/17  pt cx but no reason was given

## 2017-05-23 LAB — HM DIABETES EYE EXAM

## 2017-05-27 ENCOUNTER — Ambulatory Visit (HOSPITAL_COMMUNITY): Payer: BLUE CROSS/BLUE SHIELD | Admitting: Physical Therapy

## 2017-05-27 ENCOUNTER — Encounter (HOSPITAL_COMMUNITY): Payer: Self-pay | Admitting: Physical Therapy

## 2017-05-27 DIAGNOSIS — M546 Pain in thoracic spine: Secondary | ICD-10-CM | POA: Diagnosis not present

## 2017-05-27 DIAGNOSIS — R293 Abnormal posture: Secondary | ICD-10-CM

## 2017-05-27 DIAGNOSIS — R29898 Other symptoms and signs involving the musculoskeletal system: Secondary | ICD-10-CM

## 2017-05-27 NOTE — Therapy (Signed)
[place], [place], [address] Phone: [phone]   Fax:  [phone]  Physical Therapy Treatment  Patient Details  Name: Joanna Reid MRN: [id number] Date of Birth: 09/18/1965 Referring Provider: Baltazar Apo    Encounter Date: 05/27/2017  PT End of Session - 05/27/17 1121    Visit Number  8    Number of Visits  13    Date for PT Re-Evaluation  05/27/17    Authorization Type  BC[employer] Other (90 visits per year, 13 used)    Authorization Time Period  04/17/17 to 05/29/17    Authorization - Visit Number  8    Authorization - Number of Visits  47    PT Start Time  [phone]    PT Stop Time  1030    PT Time Calculation (min)  38 min    Activity Tolerance  Patient tolerated treatment well    Behavior During Therapy  [facility] for tasks assessed/performed       Past Medical History:  Diagnosis Date  . Acid reflux   . Allergic rhinitis   . Diabetes mellitus    type 2  . Gout   . Gout   . HBP (high blood pressure)   . Migraines     Past Surgical History:  Procedure Laterality Date  . CESAREAN SECTION    . EYE SURGERY    . HEMORRHOIDECTOMY N/A 11/19/2012   Performed by Jamesetta So, MD at [facility]  . kidney stones  1998  . LAPAROSCOPIC UNILATERAL SALPINGO OOPHORECTOMY Right 05/14/2012   Performed by [name], MD at [facility]  . PARTIAL HYSTERECTOMY    . TONSILECTOMY, ADENOIDECTOMY, BILATERAL MYRINGOTOMY AND TUBES    . VESICOVAGINAL FISTULA CLOSURE W/ TAH      There were no vitals filed for this visit.  Subjective Assessment - 05/27/17 0951    Subjective  patient states she feels she is getting better, her biggest concern is doing functional things such as laundry and lifting things, she feels burning her back after doing these things, not necessarily pain     Pertinent History  DM, gout, ADHD, obesity, hx of PT to L knee     Patient Stated Goals  return to PLOF, reduce pain, be able to wear bra again     Currently in Pain?  Yes    Pain Score  2     Pain Location  Back    Pain Orientation  Mid    Pain Descriptors / Indicators  Dull    Pain Type  Chronic pain    Pain Radiating Towards  none     Pain Onset  More than a month ago    Pain Frequency  Constant    Aggravating Factors   doing certain tasks during the day     Pain Relieving Factors  resting, heat     Effect of Pain on Daily Activities  mild-moderate                       [facility] Adult PT Treatment/Exercise - 05/27/17 0001      Neck Exercises: Theraband   Scapula Retraction  10 reps;Blue    Shoulder Extension  Blue;10 reps    Rows  10 reps;Blue      Neck Exercises: Seated   Shoulder Rolls  Backwards;15 reps    Other Seated Exercise  seated thoracic lateral reaches overhead 1x10  Other Seated Exercise  thoracic rotation stretch seated near wall for mobility 1x10 B       Neck Exercises: Supine   Other Supine Exercise  supine thoracic extensions over bolster x3 minutes;     Other Supine Exercise  active thoracic UE extensions 1x15       Lumbar Exercises: Standing   Other Standing Lumbar Exercises  modified quadruped rotation and lateral flexion 1x15 B; mad cat/old horse 1x15       Manual Therapy   Manual Therapy  Soft tissue mobilization;Joint mobilization    Manual therapy comments  Manual complete separate than rest of tx    Joint Mobilization  grade I-II PAs on thoracic spine     Soft tissue mobilization  R  thoracic paraspinals prone              PT Education - 05/27/17 1121    Education provided  Yes    Education Details  soreness following PAs, re-assess with possible frequency adjustment next session     Person(s) Educated  Patient    Methods  Explanation    Comprehension  Verbalized understanding       PT Short Term Goals - 05/06/17 1658      PT SHORT TERM GOAL #1   Title  Patient to be able to maintain upright posture at least 70% of the time without external cues in order to  combat habitual kyphotic posture and reduce pain     Baseline  10/29- ongoing postural deficits     Time  3    Period  Weeks    Status  On-going      PT SHORT TERM GOAL #2   Title  Patient to experience thoracic spine pain as being no more than 4/10 at worst in order to improve QOL     Baseline  10/29- 3-4/10 on average; can be very tender with PAs by PT     Time  3    Period  Weeks    Status  Achieved      PT SHORT TERM GOAL #3   Title  Patient to be able to perform normal depth breaths and sneeze/cough without exacerbation in pain in order to show improvement in condition and QOL     Baseline  10/29- ongiong, can still flare up with these actions     Time  3    Period  Weeks    Status  On-going      PT SHORT TERM GOAL #4   Title  Patient to be compliant with correct performance of appropriate HEP as tolerated, updated as able     Baseline  10/29- compliant     Time  1    Period  Weeks    Status  Achieved        PT Long Term Goals - 05/06/17 1700      PT LONG TERM GOAL #1   Title  patient to experience pain in thoracic spine as being no more than 2/10 in order to improve QOL and functional task tolerance     Baseline  10/29- 3-4/10    Time  6    Period  Weeks    Status  On-going      PT LONG TERM GOAL #2   Title  Patient to report she has been able to sleep in her position of choice without increase in pain thoracic spine in order to improve QOL and sleep quality     Baseline  10/29- still cannot sleep on R side, easier to sleep on stomach     Time  6    Period  Weeks    Status  Partially Met      PT LONG TERM GOAL #3   Title  Patient to be able to tolerate wearing her bra for a full day without thoracic pain exacerbation in order to improve QOL and to assist in further supporting spine/reducing spine pain     Baseline  10/29- able to do so     Time  6    Period  Weeks    Status  Achieved      PT LONG TERM GOAL #4   Title  Patient to be able to reach overhead  without thoracic spine pain in order to improve QOL and tolerance to functional task performance     Baseline  10/29- can do with one hand, two hands hurts     Time  6    Period  Weeks    Status  Partially Met            Plan - 05/27/17 1122    Clinical Impression Statement  Patient arrives reporting her major concern right now is doing functional tasks such as laundry, it is more of a burning sensation rather than pain however. Began session on moist heat, not included in billing. Then proceeded with functional thoracic mobility and regional strengthening this session, attempting to progress patient as tolerated. Recommend likely re-assess next session to determine progress and possible POC updates moving forward.     Clinical Impairments Affecting Rehab Potential  (+) moderate outcomes with PT in the past, motivated; (-) obesity, chronicity of pain, idiopathic cause, chronic kyphotic posture     PT Frequency  2x / week    PT Duration  3 weeks    PT Treatment/Interventions  ADLs/Self Care Home Management;Biofeedback;Cryotherapy;Electrical Stimulation;Iontophoresis 14m/ml Dexamethasone;Moist Heat;Traction;Ultrasound;Functional mobility training;Therapeutic activities;Therapeutic exercise;Balance training;Neuromuscular re-education;Patient/family education;Manual techniques;Passive range of motion;Dry needling;Taping    PT Next Visit Plan  re-assess     PT Home Exercise Plan  Eval (limited in scope due to high irritability of condition): increased time with heat on back, thoracic 3D excursions to tolerance, corner stretch  10/25- thoracic extension stretch, modified thoracic rotation quadruped stretch     Consulted and Agree with Plan of Care  Patient       Patient will benefit from skilled therapeutic intervention in order to improve the following deficits and impairments:  Improper body mechanics, Pain, Increased muscle spasms, Postural dysfunction, Decreased range of motion, Hypomobility,  Impaired UE functional use, Impaired flexibility  Visit Diagnosis: Pain in thoracic spine  Abnormal posture  Other symptoms and signs involving the musculoskeletal system     Problem List Patient Active Problem List   Diagnosis Date Noted  . Type 2 diabetes mellitus without complication, without long-term current use of insulin ([place] 01/27/2016  . Morbid obesity due to excess calories ([place] 01/27/2016  . Anxiety as acute reaction to exceptional stress 01/13/2015  . Attention deficit hyperactivity disorder (ADHD), 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  . Gout 01/20/2014  . GERD (gastroesophageal reflux disease) 01/20/2014  . Essential hypertension, benign 01/20/2014  . Plantar fasciitis of left foot 09/19/2011    KDeniece ReePT, DPT, CBIS  Supplemental Physical Therapist [place] [address]Phone: [phone]  Fax:  [phone] Name: Joanna BUTTRAMMRN: [id number] of  Birth: 11-25-1965

## 2017-05-28 ENCOUNTER — Encounter (HOSPITAL_COMMUNITY): Payer: Self-pay | Admitting: Physical Therapy

## 2017-05-28 ENCOUNTER — Telehealth (HOSPITAL_COMMUNITY): Payer: Self-pay

## 2017-05-28 ENCOUNTER — Other Ambulatory Visit: Payer: Self-pay

## 2017-05-28 ENCOUNTER — Ambulatory Visit (HOSPITAL_COMMUNITY): Payer: BLUE CROSS/BLUE SHIELD | Admitting: Physical Therapy

## 2017-05-28 DIAGNOSIS — N3020 Other chronic cystitis without hematuria: Secondary | ICD-10-CM | POA: Diagnosis not present

## 2017-05-28 DIAGNOSIS — R293 Abnormal posture: Secondary | ICD-10-CM | POA: Diagnosis not present

## 2017-05-28 DIAGNOSIS — M546 Pain in thoracic spine: Secondary | ICD-10-CM | POA: Diagnosis not present

## 2017-05-28 DIAGNOSIS — R29898 Other symptoms and signs involving the musculoskeletal system: Secondary | ICD-10-CM

## 2017-05-28 DIAGNOSIS — N200 Calculus of kidney: Secondary | ICD-10-CM | POA: Diagnosis not present

## 2017-05-28 NOTE — Therapy (Addendum)
[place], [place], [address] Phone: [phone]   Fax:  [phone]  Physical Therapy Re-Assessment/Discharge Summary   PHYSICAL THERAPY DISCHARGE SUMMARY  Visits from Start of Care: 9  Current functional level related to goals / functional outcomes: See below for details. Patient called and is requesting to cancel all future appointments as Dr. Wolfgang Phoenix has referred her to a specialist.   Remaining deficits: See below for details.   Education / Equipment: See below for last education provided.  Plan: Patient agrees to discharge.  Patient goals were partially met. Patient is being discharged due to the patient's request.  ?????    Kipp Brood, PT, DPT Physical Therapist with [facility]  07/22/2017 8:25 AM       Patient Details  Name: Joanna Reid MRN: [id number] Date of Birth: 10-29-1965 Referring Provider: Baltazar Apo   Encounter Date: 05/28/2017  PT End of Session - 05/28/17 1635    Visit Number  9    Number of Visits  13    Date for PT Re-Evaluation  05/27/17    Authorization Type  BCBS Other (90 visits per year, 13 used)    Authorization Time Period  04/17/17 to 05/29/17    Authorization - Visit Number  9    Authorization - Number of Visits  71    PT Start Time  4967    PT Stop Time  1622    PT Time Calculation (min)  45 min    Activity Tolerance  Patient tolerated treatment well    Behavior During Therapy  [facility] for tasks assessed/performed       Past Medical History:  Diagnosis Date  . Acid reflux   . Allergic rhinitis   . Diabetes mellitus    type 2  . Gout   . Gout   . HBP (high blood pressure)   . Migraines     Past Surgical History:  Procedure Laterality Date  . CESAREAN SECTION    . EYE SURGERY    . HEMORRHOID SURGERY N/A 11/19/2012   Procedure: HEMORRHOIDECTOMY;  Surgeon: Jamesetta So, MD;  Location: [facility];  Service: General;   Laterality: N/A;  . kidney stones  1998  . LAPAROSCOPIC UNILATERAL SALPINGO OOPHERECTOMY  05/14/2012   Procedure: LAPAROSCOPIC UNILATERAL SALPINGO OOPHORECTOMY;  Surgeon: [name], MD;  Location: [facility];  Service: Gynecology;  Laterality: Right;  laparoscopic right salpingo-oophorectomy  . PARTIAL HYSTERECTOMY    . TONSILECTOMY, ADENOIDECTOMY, BILATERAL MYRINGOTOMY AND TUBES    . VESICOVAGINAL FISTULA CLOSURE W/ TAH      There were no vitals filed for this visit.  Subjective Assessment - 05/28/17 1539    Subjective  Patient states she is a little sore from yesterday doing the exercises and the STW. She still doesn't do laundry and lifting things and would really like to get back to that. she'llput thing is teh washer but he takes everything out. She has started vacuuming again and it hasn't bothered her. Patient states she feels she has made good progress and feels she could continue to make progress with more therapy however is unable to make appointments in December.    Pertinent History  DM, gout, ADHD, obesity, hx of PT to L knee     How long can you sit comfortably?  10/29- much better; 05/28/17 - a couple hours  before need to do shoulder rolls    How long can you stand  comfortably?  10/29- she still has to take chairs to games due to pain; 05/28/17 - as long as I want    How long can you walk comfortably?  10/29 unlimited; 05/28/17 - limited by knee pain    Patient Stated Goals  return to PLOF, reduce pain, be able to wear bra again     Currently in Pain?  No/denies    Pain Score  -- no pain just a sore discomfort    Pain Onset  --    Pain Relieving Factors  reasting heat, shuolder rolls and HEP       [facility] PT Assessment - 05/28/17 0001      Assessment   Medical Diagnosis  unspecified back pain     Referring Provider  Baltazar Apo    Next MD Visit  January unless she needs go sooner    Prior Therapy  PT for her knee earlier this year       Precautions   Precautions  None       Restrictions   Weight Bearing Restrictions  No      Prior Function   Level of Independence  Independent;Independent with basic ADLs    Vocation  Full time employment    Leisure  sit down job       AROM   Overall AROM Comments  shudler ROM WFL, no increase in thoracic pain    Cervical Flexion  WFL     Cervical Extension  WFL     Cervical - Right Side Bend  WFL     Cervical - Left Side Bend  WFL     Cervical - Right Rotation  WFL     Cervical - Left Rotation  [facility]     Lumbar Flexion  WFL painfree     Lumbar Extension  WFL painfree    Lumbar - Right Side Bend  WFL painfree    Lumbar - Left Side Bend  WFL painfree    Thoracic Flexion  [facility]    Thoracic Extension  ~ 30% limited improved thoracic extension after grade II PA, T1-T10 ~ 20%    Thoracic - Right Side Bend  WNL mild pain     Thoracic - Left Side Bend  WNL     Thoracic - Right Rotation  WNL    Thoracic - Left Rotation  WNL      Palpation   Spinal mobility  hypomobile along T1-T10    Palpation comment  no tenderness to palpation along paraspinal       [facility] Adult PT Treatment/Exercise - 05/28/17 0001      Lumbar Exercises: Quadruped   Other Quadruped Lumbar Exercises  modified quadraped leaning into table for thoracic spine openers, open book, 10x each side      Shoulder Exercises: Standing   Extension  AROM;Strengthening;Both;10 reps;Theraband    Theraband Level (Shoulder Extension)  Level 4 (Blue)    Row  AROM;Strengthening;Both;Theraband;10 reps    Theraband Level (Shoulder Row)  Level 4 (Blue)    Row Limitations  --    Other Standing Exercises  1x 10 reps blue theraband with horizontal abduction to 90* and row    Other Standing Exercises  Wall slides with lift off to facilitate thoracic extension      Moist Heat Therapy   Number Minutes Moist Heat  10 Minutes    Moist Heat Location  Other (comment) thoracic spine      Manual Therapy   Manual Therapy  Joint mobilization  Manual therapy comments  Manual  complete separate than rest of tx    Joint Mobilization  grade I-II PAs on thoracic spine         PT Education - 05/28/17 1634    Education provided  Yes    Education Details  educated on delayed onset muscle soreness and on benefits of HEP, reviewed progress towards goals and educated patient on self managing at home with HEP until follwo up appointments in January. Provided additional HEP exercises.    Person(s) Educated  Patient    Methods  Explanation;Handout    Comprehension  Verbalized understanding;Returned demonstration       PT Short Term Goals - 05/28/17 1551      PT SHORT TERM GOAL #1   Title  Patient to be able to maintain upright posture at least 70% of the time without external cues in order to combat habitual kyphotic posture and reduce pain     Baseline  10/29- ongoing postural deficits; 05/28/17 - still using towel roll at work for posture but feels she only maintains postrue about 40% of time    Time  3    Period  Weeks    Status  On-going      PT SHORT TERM GOAL #2   Title  Patient to experience thoracic spine pain as being no more than 4/10 at worst in order to improve QOL     Baseline  10/29- 3-4/10 on average; can be very tender with PAs by PT; 05/28/17 - than a 2-3/10 thorghuot daily activities    Time  3    Period  Weeks    Status  Achieved      PT SHORT TERM GOAL #3   Title  Patient to be able to perform normal depth breaths and sneeze/cough without exacerbation in pain in order to show improvement in condition and QOL     Baseline  10/29- ongiong, can still flare up with these actions, 05/28/17 - patient no longer has pain with breathing, coughing, sneezing    Time  3    Period  Weeks    Status  Achieved      PT SHORT TERM GOAL #4   Title  Patient to be compliant with correct performance of appropriate HEP as tolerated, updated as able     Baseline  10/29- compliant     Time  1    Period  Weeks    Status  Achieved        PT Long Term Goals -  05/28/17 1546      PT LONG TERM GOAL #1   Title  patient to experience pain in thoracic spine as being no more than 2/10 in order to improve QOL and functional task tolerance     Baseline  10/29- 3-4/10; 05/28/17 - doesn't hurt or throb (it's like its just in the background) in the past week no higher than 2/10 but was up to a  6/10 prior weeks.    Time  6    Period  Weeks    Status  Partially Met      PT LONG TERM GOAL #2   Title  Patient to report she has been able to sleep in her position of choice without increase in pain thoracic spine in order to improve QOL and sleep quality     Baseline  10/29- still cannot sleep on R side, easier to sleep on stomach; 05/28/17 - can lay on stomach for a little  bit but still can't sleep on stomach or right side, (sleeps on left)    Time  6    Period  Weeks    Status  On-going      PT LONG TERM GOAL #3   Title  Patient to be able to tolerate wearing her bra for a full day without thoracic pain exacerbation in order to improve QOL and to assist in further supporting spine/reducing spine pain     Baseline  10/29- able to do so; 05/28/17 - patient is wearing regular bras with no increase in pain    Time  6    Period  Weeks    Status  Achieved      PT LONG TERM GOAL #4   Title  Patient to be able to reach overhead without thoracic spine pain in order to improve QOL and tolerance to functional task performance     Baseline  10/29- can do with one hand, two hands hurts; 05/28/17 - no increase in pain with washing hair or reaching up in cabinets    Time  6    Period  Weeks    Status  Achieved        Plan - 05/28/17 1636    Clinical Impression Statement  Patient arrives feeling sore from exercises yesterday but stating she has no back pain currently. She continues to be mainly limited with functional tasks at home like lifting laundry. Patient has progressed towards goals and has met or partially met 3/4 STG's and 3/4 LTG's. She contniues to be  limited with thoracic motion and had improved mobility following PA's along spine. Patient is unavailable in December to make appointments and has been instructed ion and provided HEP to maintain progress and current functional level. She will benefit from ongoing skilled PT to achieve LTG's and improve functional mobility and independence with daily tasks.     Rehab Potential  Good    Clinical Impairments Affecting Rehab Potential  (+) moderate outcomes with PT in the past, motivated; (-) obesity, chronicity of pain, idiopathic cause, chronic kyphotic posture     PT Frequency  2x / week    PT Duration  4 weeks    PT Treatment/Interventions  ADLs/Self Care Home Management;Biofeedback;Cryotherapy;Electrical Stimulation;Iontophoresis 58m/ml Dexamethasone;Moist Heat;Traction;Ultrasound;Functional mobility training;Therapeutic activities;Therapeutic exercise;Balance training;Neuromuscular re-education;Patient/family education;Manual techniques;Passive range of motion;Dry needling;Taping    PT Next Visit Plan  Re-assess and treat in January for follow-up on home management and HEP and to progress to LTG's    PT Home Exercise Plan  Eval (limited in scope due to high irritability of condition): increased time with heat on back, thoracic 3D excursions to tolerance, corner stretch  10/25- thoracic extension stretch, modified thoracic rotation quadruped stretch; 05/28/17 - thoracic openers "open book" in modified quadraped, wall walks with lift off    Consulted and Agree with Plan of Care  Patient       Patient will benefit from skilled therapeutic intervention in order to improve the following deficits and impairments:  Improper body mechanics, Pain, Increased muscle spasms, Postural dysfunction, Decreased range of motion, Hypomobility, Impaired UE functional use, Impaired flexibility  Visit Diagnosis: Pain in thoracic spine  Abnormal posture  Other symptoms and signs involving the musculoskeletal  system   Problem List Patient Active Problem List   Diagnosis Date Noted  . Type 2 diabetes mellitus without complication, without long-term current use of insulin ([place] 01/27/2016  . Morbid obesity due to excess calories ([place] 01/27/2016  . Anxiety as  acute reaction to exceptional stress 01/13/2015  . Attention deficit hyperactivity disorder (ADHD), 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  . Gout 01/20/2014  . GERD (gastroesophageal reflux disease) 01/20/2014  . Essential hypertension, benign 01/20/2014  . Plantar fasciitis of left foot 09/19/2011    Debara Pickett, PT, DPT Physical Therapist with [facility]  05/28/2017 6:05 PM    [place], [place], [address] Phone: [phone]   Fax:  [phone]  Name: Joanna Reid MRN: [id number] Date of Birth: [birth year]

## 2017-05-28 NOTE — Patient Instructions (Signed)
   Thoracic Opener: perform leaning into counter for support  1-2 sets 10 repetitions on each side.     Wall Lift Off: 1-2 sets x 10 repetitions  1) Begin standing about one foot away from the wall and your forearms against the wall with elbows bent.   2) Keep your core engaged and shoulders gently squeezed back in order to maintain good posture while sliding your forearms up the wall. Don't sway your back as you get to the top.

## 2017-05-28 NOTE — Telephone Encounter (Signed)
I called Joanna Reid at her mobile phone number on file. She answered and informed me that she was on her way and thought her appointment was at [facility] today. I re-assured her we had time to see her and to be safe driving. Will provide Treatment/re-assessment upon arrival.  Joanna Reid, PT, DPT Physical Therapist with [facility]  05/28/2017 3:32 PM

## 2017-07-06 ENCOUNTER — Other Ambulatory Visit: Payer: Self-pay | Admitting: Nurse Practitioner

## 2017-07-08 ENCOUNTER — Telehealth (HOSPITAL_COMMUNITY): Payer: Self-pay

## 2017-07-08 NOTE — Telephone Encounter (Signed)
Patient wants to cancel her appts for this week and after she sees her MD concerning some other issues she is having she will call us back and let us know if he want her to continue rehab

## 2017-07-10 ENCOUNTER — Ambulatory Visit (HOSPITAL_COMMUNITY): Payer: BLUE CROSS/BLUE SHIELD

## 2017-07-12 ENCOUNTER — Ambulatory Visit (HOSPITAL_COMMUNITY): Payer: BLUE CROSS/BLUE SHIELD

## 2017-07-15 ENCOUNTER — Telehealth (HOSPITAL_COMMUNITY): Payer: Self-pay | Admitting: Family Medicine

## 2017-07-15 ENCOUNTER — Ambulatory Visit (HOSPITAL_COMMUNITY): Payer: BLUE CROSS/BLUE SHIELD

## 2017-07-15 NOTE — Telephone Encounter (Signed)
07/15/17  pt cx - said that she hasn't seen Dr. Wolfgang Phoenix yet and doesn't know if she is to continue or start on her hip

## 2017-07-16 ENCOUNTER — Telehealth: Payer: Self-pay | Admitting: Family Medicine

## 2017-07-16 NOTE — Telephone Encounter (Signed)
Patient is needing a letter from her PCP for her bariatric surgery.  She has an appointment with the surgeon scheduled 07/31/17.  She wants to know if this is something that Dr. Richardson Landry can go ahead and do for her or if this has to wait until she comes in the office on 07/18/17.

## 2017-07-16 NOTE — Telephone Encounter (Signed)
I called left a message to r/c. 

## 2017-07-16 NOTE — Telephone Encounter (Signed)
I spoke with the pt and she states she is on step six of the eleven steps to bariatric surgery. They are needing a letter of Medical necessity from [name] stating she needs this surgery due to her comorbidites . Does she need an appt with you for this or can you write a letter of medical necessity with out seeing her stating it is important to have done? Please advise.

## 2017-07-16 NOTE — Telephone Encounter (Signed)
ov

## 2017-07-17 ENCOUNTER — Encounter (HOSPITAL_COMMUNITY): Payer: BLUE CROSS/BLUE SHIELD | Admitting: Physical Therapy

## 2017-07-18 ENCOUNTER — Ambulatory Visit: Payer: BLUE CROSS/BLUE SHIELD | Admitting: Family Medicine

## 2017-07-18 ENCOUNTER — Encounter: Payer: Self-pay | Admitting: Family Medicine

## 2017-07-18 VITALS — BP 134/82 | Ht 64.5 in | Wt 274.6 lb

## 2017-07-18 DIAGNOSIS — E119 Type 2 diabetes mellitus without complications: Secondary | ICD-10-CM | POA: Diagnosis not present

## 2017-07-18 DIAGNOSIS — I10 Essential (primary) hypertension: Secondary | ICD-10-CM | POA: Diagnosis not present

## 2017-07-18 LAB — POCT GLYCOSYLATED HEMOGLOBIN (HGB A1C): Hemoglobin A1C: 6.3

## 2017-07-18 MED ORDER — ALLOPURINOL 300 MG PO TABS: ORAL_TABLET | ORAL | 1 refills | Status: DC

## 2017-07-18 MED ORDER — OMEPRAZOLE 20 MG PO CPDR: 20.0000 mg | DELAYED_RELEASE_CAPSULE | Freq: Every day | ORAL | 1 refills | Status: DC

## 2017-07-18 MED ORDER — LOSARTAN POTASSIUM 100 MG PO TABS: 100.0000 mg | ORAL_TABLET | Freq: Every day | ORAL | 1 refills | Status: DC

## 2017-07-18 MED ORDER — VERAPAMIL HCL ER 240 MG PO TBCR: 240.0000 mg | EXTENDED_RELEASE_TABLET | Freq: Every day | ORAL | 1 refills | Status: DC

## 2017-07-18 MED ORDER — METFORMIN HCL 500 MG PO TABS: ORAL_TABLET | ORAL | 1 refills | Status: DC

## 2017-07-18 MED ORDER — BUMETANIDE 1 MG PO TABS: 1.0000 mg | ORAL_TABLET | Freq: Every day | ORAL | 1 refills | Status: DC

## 2017-07-18 MED ORDER — ALBUTEROL SULFATE HFA 108 (90 BASE) MCG/ACT IN AERS: 1.0000 | INHALATION_SPRAY | Freq: Every day | RESPIRATORY_TRACT | 5 refills | Status: DC | PRN

## 2017-07-18 NOTE — Telephone Encounter (Signed)
Discussed with pt. Pt states she already has appt scheduled for today.

## 2017-07-18 NOTE — Progress Notes (Signed)
   Subjective:    Patient ID: Joanna Reid, female    DOB: October 11, 1965, 52 y.o.   MRN: [id number]  HPI  Patient arrives for a follow up on back pain.   Patient also needs a letter for her surgeon for bairiatric surgery.  Wants to do bariatric intervention  Serious about it  Needing knee surg in order to have knee replacement due to bone on bone  Obesity is causing the patient to be unable to exercise at all, which is aggravating her chronic conditions including her diabetes.  Patient also is aware that her excessive adipose tissue leads to diabetes abnormalities and makes it harder to control.  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 Compliant with blood pressure medicine. States does not miss a dose. No obvious side effects. Blood pressure generally good when checked elsewhere. Watching salt intake.     Doc that phent ermine did not helpten pounds and then b p ran u p        Pt went tthru phys therapy since nov now getting pain in back and progressing hurts to ough or laugh or sneeze    Review of Systems No headache, no major weight loss or weight gain, no chest pain no back pain abdominal pain no change in bowel habits complete ROS otherwise negative     Objective:   Physical Exam  Alert and oriented, vitals reviewed and stable, NAD ENT-TM's and ext canals WNL bilat via otoscopic exam Soft palate, tonsils and post pharynx WNL via oropharyngeal exam Neck-symmetric, no masses; thyroid nonpalpable and nontender Pulmonary-no tachypnea or accessory muscle use; Clear without  wheezes via auscultation Card--no abnrml murmurs, rhythm reg and rate WNL Carotid pulses symmetric, without bruits      Assessment & Plan:  Present #1 type 2 diabetes.  A1c good.  Diet discussed.  Exercise discussed.  Compliance with medication confirmed and discussed  2.  Hypertension.  Good control discussed maintain same meds  3.  Morbid obesity.  Patient now facing need to press on with bariatric interventions.  Controlled diets and medication patient effort has been not helpful with her serious issue.  Patient looking towards bariatric intervention and I certainly agree with all of her current comorbidities.  We will dictate a letter in this regard

## 2017-07-19 ENCOUNTER — Telehealth (HOSPITAL_COMMUNITY): Payer: Self-pay | Admitting: Family Medicine

## 2017-07-19 NOTE — Telephone Encounter (Signed)
07/19/17  patient called and asked that we cancel all appts.  she said that Dr. Wolfgang Phoenix referred her to a specialist

## 2017-07-22 ENCOUNTER — Encounter (HOSPITAL_COMMUNITY): Payer: BLUE CROSS/BLUE SHIELD

## 2017-07-24 ENCOUNTER — Encounter (HOSPITAL_COMMUNITY): Payer: BLUE CROSS/BLUE SHIELD

## 2017-07-29 ENCOUNTER — Encounter (HOSPITAL_COMMUNITY): Payer: BLUE CROSS/BLUE SHIELD

## 2017-07-31 ENCOUNTER — Encounter (HOSPITAL_COMMUNITY): Payer: BLUE CROSS/BLUE SHIELD

## 2017-07-31 DIAGNOSIS — I10 Essential (primary) hypertension: Secondary | ICD-10-CM | POA: Diagnosis not present

## 2017-07-31 DIAGNOSIS — E1169 Type 2 diabetes mellitus with other specified complication: Secondary | ICD-10-CM | POA: Diagnosis not present

## 2017-07-31 DIAGNOSIS — E669 Obesity, unspecified: Secondary | ICD-10-CM | POA: Diagnosis not present

## 2017-08-02 ENCOUNTER — Other Ambulatory Visit (HOSPITAL_COMMUNITY): Payer: Self-pay | Admitting: Surgery

## 2017-08-02 DIAGNOSIS — I10 Essential (primary) hypertension: Secondary | ICD-10-CM

## 2017-08-05 ENCOUNTER — Encounter (HOSPITAL_COMMUNITY): Payer: BLUE CROSS/BLUE SHIELD

## 2017-08-07 ENCOUNTER — Encounter (HOSPITAL_COMMUNITY): Payer: BLUE CROSS/BLUE SHIELD

## 2017-08-08 DIAGNOSIS — I10 Essential (primary) hypertension: Secondary | ICD-10-CM | POA: Diagnosis not present

## 2017-08-12 ENCOUNTER — Encounter: Payer: BLUE CROSS/BLUE SHIELD | Attending: Family Medicine | Admitting: Registered"

## 2017-08-12 ENCOUNTER — Encounter: Payer: Self-pay | Admitting: Registered"

## 2017-08-12 DIAGNOSIS — I10 Essential (primary) hypertension: Secondary | ICD-10-CM | POA: Insufficient documentation

## 2017-08-12 DIAGNOSIS — E119 Type 2 diabetes mellitus without complications: Secondary | ICD-10-CM

## 2017-08-12 DIAGNOSIS — Z713 Dietary counseling and surveillance: Secondary | ICD-10-CM | POA: Insufficient documentation

## 2017-08-12 NOTE — Progress Notes (Signed)
Pre-Op Assessment Visit:  Pre-Operative Sleeve gastrectomy Surgery  Medical Nutrition Therapy:  Appt start time: 8:30  End time: 9:40  Patient was seen on 08/12/2017 for Pre-Operative Nutrition Assessment. Assessment and letter of approval faxed to [facility] Bariatric Surgery Program coordinator on 08/12/2017.   Pt expectation of surgery: reduce medications, increase mobility, prepare to have knee replacement surgery (needs to lose 50 lbs)  Pt expectation of Dietitian: prefers lists of things that she cannot do; states she is a "cold [place]" person  Start weight at [facility]: 276.8 BMI: 49.42   Pt states she needs to be 50 lbs lighter to have knee replacements surgery. Pt states she has has diabetes for 10 years, recent A1c was 6.3. Pt states she has membership at [facility] and swims 3 days/week. Pt reports 74mm kidney stone in right kidney; taking antibiotics. Pt states she loves carbohydrates, taters and pastas. Pt states she does not care for sweets. Pt states her crack is [facility]; has to have at least once a day to prevent headaches. Pt states her father and brothers called her "fat sis" during her childhood. Pt states food was her comfort years ago. Pt states she does not eat anything fried. Pt states husband is supportive and helps hold her accountable.   Per insurance, pt needs 3 SWL visits prior to surgery.    24 hr Dietary Recall: First Meal: yogurt with granola Snack: none Second Meal: banana sandwich with mayo, chips/oatmeal cookie/christmas cake or leftovers Snack: peanut butter crackers or sandwich Third Meal: grilled pork chops, beans or chicken, potatoes, corn Snack: none Beverages: coffee with cream and sugar, mountain dew, 2-16 oz bottles of water, sweet tea   Encouraged to engage in 150 minutes of moderate physical activity including cardiovascular and weight baring weekly  Handouts given during visit include:  . Pre-Op Goals  During the appointment today  the following Pre-Op Goals were reviewed with the patient: . Track your food and beverage: MyFitness Pal or Baritastic App . Make healthy food choices . Begin to limit portion sizes . Limited concentrated sugars and fried foods . Keep fat/sugar in the single digits per serving on          food labels . Practice CHEWING your food  (aim for 30 chews per bite or until applesauce consistency) . Practice not drinking 15 minutes before, during, and 30 minutes after each meal/snack . Avoid all carbonated beverages  . Avoid/limit caffeinated beverages  . Avoid all sugar-sweetened beverages . Avoid alcohol . Consume 3 meals per day; eat every 3-5 hours . Make a list of non-food related activities . Aim for 64-100 ounces of FLUID daily  . Aim for at least 60-80 grams of PROTEIN daily . Look for a liquid protein source that contain ?15 g protein and ?5 g carbohydrate  (ex: shakes, drinks, shots) . Physical activity is an important part of a healthy lifestyle so keep it moving!  Follow diet recommendations listed below Energy and Macronutrient Recommendations: Calories: 1600 Carbohydrate: 180 Protein: 120 Fat: 44  Demonstrated degree of understanding via:  Teach Back   Teaching Method Utilized:  Visual Auditory Hands on  Barriers to learning/adherence to lifestyle change: none identified  Patient to call the [facility] to enroll in Pre-Op and Post-Op Nutrition Education when surgery date is scheduled.

## 2017-08-20 ENCOUNTER — Other Ambulatory Visit: Payer: Self-pay

## 2017-08-20 ENCOUNTER — Ambulatory Visit (HOSPITAL_COMMUNITY)
Admission: RE | Admit: 2017-08-20 | Discharge: 2017-08-20 | Disposition: A | Discharge status: Home / Self Care | Payer: BLUE CROSS/BLUE SHIELD | Source: Ambulatory Visit | Attending: Surgery | Admitting: Surgery

## 2017-08-20 ENCOUNTER — Other Ambulatory Visit (HOSPITAL_COMMUNITY): Payer: Self-pay | Admitting: Surgery

## 2017-08-20 DIAGNOSIS — M1711 Unilateral primary osteoarthritis, right knee: Secondary | ICD-10-CM | POA: Insufficient documentation

## 2017-08-20 DIAGNOSIS — J45909 Unspecified asthma, uncomplicated: Secondary | ICD-10-CM | POA: Insufficient documentation

## 2017-08-20 DIAGNOSIS — Z87891 Personal history of nicotine dependence: Secondary | ICD-10-CM | POA: Insufficient documentation

## 2017-08-20 DIAGNOSIS — Z6841 Body Mass Index (BMI) 40.0 and over, adult: Secondary | ICD-10-CM | POA: Diagnosis not present

## 2017-08-20 DIAGNOSIS — K219 Gastro-esophageal reflux disease without esophagitis: Secondary | ICD-10-CM | POA: Insufficient documentation

## 2017-08-20 DIAGNOSIS — E7800 Pure hypercholesterolemia, unspecified: Secondary | ICD-10-CM | POA: Insufficient documentation

## 2017-08-20 DIAGNOSIS — Z9889 Other specified postprocedural states: Secondary | ICD-10-CM | POA: Diagnosis not present

## 2017-08-20 DIAGNOSIS — I10 Essential (primary) hypertension: Secondary | ICD-10-CM | POA: Insufficient documentation

## 2017-08-20 DIAGNOSIS — Z90710 Acquired absence of both cervix and uterus: Secondary | ICD-10-CM | POA: Diagnosis not present

## 2017-08-20 DIAGNOSIS — Z87442 Personal history of urinary calculi: Secondary | ICD-10-CM | POA: Diagnosis not present

## 2017-08-20 DIAGNOSIS — Z01818 Encounter for other preprocedural examination: Secondary | ICD-10-CM | POA: Diagnosis not present

## 2017-08-20 DIAGNOSIS — Z8249 Family history of ischemic heart disease and other diseases of the circulatory system: Secondary | ICD-10-CM | POA: Insufficient documentation

## 2017-08-20 DIAGNOSIS — Z811 Family history of alcohol abuse and dependence: Secondary | ICD-10-CM | POA: Insufficient documentation

## 2017-08-20 DIAGNOSIS — Z818 Family history of other mental and behavioral disorders: Secondary | ICD-10-CM | POA: Insufficient documentation

## 2017-08-20 DIAGNOSIS — Z7984 Long term (current) use of oral hypoglycemic drugs: Secondary | ICD-10-CM | POA: Diagnosis not present

## 2017-08-20 DIAGNOSIS — K7689 Other specified diseases of liver: Secondary | ICD-10-CM | POA: Diagnosis not present

## 2017-08-20 DIAGNOSIS — E1169 Type 2 diabetes mellitus with other specified complication: Secondary | ICD-10-CM | POA: Diagnosis not present

## 2017-08-20 DIAGNOSIS — K5710 Diverticulosis of small intestine without perforation or abscess without bleeding: Secondary | ICD-10-CM | POA: Diagnosis not present

## 2017-08-20 DIAGNOSIS — Z79899 Other long term (current) drug therapy: Secondary | ICD-10-CM | POA: Insufficient documentation

## 2017-08-20 DIAGNOSIS — K649 Unspecified hemorrhoids: Secondary | ICD-10-CM | POA: Insufficient documentation

## 2017-08-20 DIAGNOSIS — Z833 Family history of diabetes mellitus: Secondary | ICD-10-CM | POA: Diagnosis not present

## 2017-08-20 DIAGNOSIS — M549 Dorsalgia, unspecified: Secondary | ICD-10-CM | POA: Insufficient documentation

## 2017-08-22 DIAGNOSIS — M47814 Spondylosis without myelopathy or radiculopathy, thoracic region: Secondary | ICD-10-CM | POA: Diagnosis not present

## 2017-08-22 DIAGNOSIS — M5134 Other intervertebral disc degeneration, thoracic region: Secondary | ICD-10-CM | POA: Diagnosis not present

## 2017-08-23 ENCOUNTER — Other Ambulatory Visit: Payer: Self-pay | Admitting: Family Medicine

## 2017-09-03 ENCOUNTER — Ambulatory Visit: Payer: Self-pay | Admitting: Registered"

## 2017-09-10 ENCOUNTER — Encounter: Payer: Self-pay | Admitting: Family Medicine

## 2017-09-10 ENCOUNTER — Ambulatory Visit: Payer: BLUE CROSS/BLUE SHIELD | Admitting: Family Medicine

## 2017-09-10 VITALS — BP 149/102 | Temp 98.5°F | Ht 62.75 in | Wt 266.2 lb

## 2017-09-10 DIAGNOSIS — R300 Dysuria: Secondary | ICD-10-CM | POA: Diagnosis not present

## 2017-09-10 DIAGNOSIS — M545 Low back pain: Secondary | ICD-10-CM

## 2017-09-10 DIAGNOSIS — S39012A Strain of muscle, fascia and tendon of lower back, initial encounter: Secondary | ICD-10-CM | POA: Diagnosis not present

## 2017-09-10 LAB — POCT URINALYSIS DIPSTICK
SPEC GRAV UA: 1.015 (ref 1.010–1.025)
pH, UA: 6 (ref 5.0–8.0)

## 2017-09-10 MED ORDER — PREDNISONE 20 MG PO TABS: ORAL_TABLET | ORAL | 0 refills | Status: DC

## 2017-09-10 NOTE — Progress Notes (Signed)
   Subjective:    Patient ID: Joanna Reid, female    DOB: [birth year], 52 y.o.   MRN: [id number]  Back Pain  This is a new problem. The current episode started in the past 7 days. The quality of the pain is described as aching. The pain is severe. The pain is the same all the time. The symptoms are aggravated by lying down. She has tried heat and ice (Tylenol) for the symptoms.  Pt states No pain med has helped. Results for orders placed or performed in visit on 09/10/17  POCT Urinalysis Dipstick  Result Value Ref Range   Color, UA     Clarity, UA     Glucose, UA     Bilirubin, UA     Ketones, UA     Spec Grav, UA 1.015 1.010 - 1.025   Blood, UA     pH, UA 6.0 5.0 - 8.0   Protein, UA     Urobilinogen, UA  0.2 or 1.0 E.U./dL   Nitrite, UA     Leukocytes, UA  Negative   Appearance     Odor     Lower back pain severe and excrutiating  Stays in the low back  , sinilar on oth sides  Pt had an injection valentines day, did not really help, pt ha an mri   Due lower back lumbar injection  gabapenit and baclofen  Was doing some extra lifting an d experience sig discomfort      No vom no fever no dysuria   Review of Systems  Musculoskeletal: Positive for back pain.       Objective:   Physical Exam Alert active good hydration diffuse paralumbar tenderness.  Negative CVA tenderness negative straight leg raise.  Lungs clear.  Heart rate and rhythm       Assessment & Plan:  Impression acute lumbar strain discussed.  Urinalysis negative.  Add prednisone taper.  Hydrocodone as needed.  Maintain baclofen local measures discussed.  Patient continues to struggle with chronic back pain followed by specialist for this

## 2017-09-12 ENCOUNTER — Ambulatory Visit: Payer: BLUE CROSS/BLUE SHIELD | Admitting: Psychiatry

## 2017-09-20 DIAGNOSIS — M47816 Spondylosis without myelopathy or radiculopathy, lumbar region: Secondary | ICD-10-CM | POA: Diagnosis not present

## 2017-09-20 DIAGNOSIS — M47894 Other spondylosis, thoracic region: Secondary | ICD-10-CM | POA: Diagnosis not present

## 2017-09-20 DIAGNOSIS — M1711 Unilateral primary osteoarthritis, right knee: Secondary | ICD-10-CM | POA: Insufficient documentation

## 2017-09-20 DIAGNOSIS — M5136 Other intervertebral disc degeneration, lumbar region: Secondary | ICD-10-CM | POA: Diagnosis not present

## 2017-09-20 DIAGNOSIS — G894 Chronic pain syndrome: Secondary | ICD-10-CM | POA: Diagnosis not present

## 2017-09-20 DIAGNOSIS — M47896 Other spondylosis, lumbar region: Secondary | ICD-10-CM | POA: Diagnosis not present

## 2017-09-20 DIAGNOSIS — M4316 Spondylolisthesis, lumbar region: Secondary | ICD-10-CM | POA: Diagnosis not present

## 2017-09-20 DIAGNOSIS — M47814 Spondylosis without myelopathy or radiculopathy, thoracic region: Secondary | ICD-10-CM | POA: Diagnosis not present

## 2017-09-20 DIAGNOSIS — M545 Low back pain: Secondary | ICD-10-CM | POA: Diagnosis not present

## 2017-09-20 DIAGNOSIS — M5134 Other intervertebral disc degeneration, thoracic region: Secondary | ICD-10-CM | POA: Diagnosis not present

## 2017-09-20 DIAGNOSIS — M40204 Unspecified kyphosis, thoracic region: Secondary | ICD-10-CM | POA: Diagnosis not present

## 2017-09-20 DIAGNOSIS — Z79891 Long term (current) use of opiate analgesic: Secondary | ICD-10-CM | POA: Diagnosis not present

## 2017-09-23 ENCOUNTER — Encounter: Payer: BLUE CROSS/BLUE SHIELD | Attending: Family Medicine | Admitting: Skilled Nursing Facility1

## 2017-09-23 DIAGNOSIS — I10 Essential (primary) hypertension: Secondary | ICD-10-CM | POA: Diagnosis not present

## 2017-09-23 DIAGNOSIS — E119 Type 2 diabetes mellitus without complications: Secondary | ICD-10-CM

## 2017-09-23 DIAGNOSIS — Z713 Dietary counseling and surveillance: Secondary | ICD-10-CM | POA: Insufficient documentation

## 2017-09-25 ENCOUNTER — Encounter: Payer: Self-pay | Admitting: Skilled Nursing Facility1

## 2017-09-25 NOTE — Progress Notes (Signed)
Pre-Operative Nutrition Class:  Appt start time: 9371   End time:  1830.  Patient was seen on 09/23/2017 for Pre-Operative Bariatric Surgery Education at [facility].   Surgery date:  Surgery type: sleeve Start weight at [facility]: 276 Weight today: 274.9  Samples given per MNT protocol. Patient educated on appropriate usage: Bariatric Advantage Multivitamin Lot # [id number] Exp: 01/2018  Bariatric Advantage Calcium  Lot # [id number] Exp: 04/26/18  Renee Pain Protein  Shake Lot # [id number] Exp: 04/26/18  The following the learning objectives were met by the patient during this course:  Identify Pre-Op Dietary Goals and will begin 2 weeks pre-operatively  Identify appropriate sources of fluids and proteins   State protein recommendations and appropriate sources pre and post-operatively  Identify Post-Operative Dietary Goals and will follow for 2 weeks post-operatively  Identify appropriate multivitamin and calcium sources  Describe the need for physical activity post-operatively and will follow MD recommendations  State when to call healthcare provider regarding medication questions or post-operative complications  Handouts given during class include:  Pre-Op Bariatric Surgery Diet Handout  Protein Shake Handout  Post-Op Bariatric Surgery Nutrition Handout  BELT Program Information Flyer  Support Group Information Flyer  [facility] Bariatric Supplements Price List  Follow[facility]: [facility] diet advancement per MD.

## 2017-09-27 DIAGNOSIS — N200 Calculus of kidney: Secondary | ICD-10-CM | POA: Diagnosis not present

## 2017-09-27 DIAGNOSIS — N3020 Other chronic cystitis without hematuria: Secondary | ICD-10-CM | POA: Diagnosis not present

## 2017-10-01 ENCOUNTER — Other Ambulatory Visit: Payer: Self-pay | Admitting: Urology

## 2017-10-02 ENCOUNTER — Other Ambulatory Visit: Payer: Self-pay | Admitting: Nurse Practitioner

## 2017-10-04 DIAGNOSIS — E119 Type 2 diabetes mellitus without complications: Secondary | ICD-10-CM | POA: Diagnosis not present

## 2017-10-04 DIAGNOSIS — E782 Mixed hyperlipidemia: Secondary | ICD-10-CM | POA: Diagnosis not present

## 2017-10-04 DIAGNOSIS — Z719 Counseling, unspecified: Secondary | ICD-10-CM | POA: Diagnosis not present

## 2017-10-04 DIAGNOSIS — Z008 Encounter for other general examination: Secondary | ICD-10-CM | POA: Diagnosis not present

## 2017-10-07 ENCOUNTER — Encounter (HOSPITAL_COMMUNITY): Payer: Self-pay | Admitting: *Deleted

## 2017-10-08 ENCOUNTER — Other Ambulatory Visit: Payer: Self-pay

## 2017-10-08 ENCOUNTER — Encounter (HOSPITAL_COMMUNITY): Payer: Self-pay | Admitting: *Deleted

## 2017-10-09 DIAGNOSIS — M47816 Spondylosis without myelopathy or radiculopathy, lumbar region: Secondary | ICD-10-CM | POA: Diagnosis not present

## 2017-10-09 DIAGNOSIS — M47814 Spondylosis without myelopathy or radiculopathy, thoracic region: Secondary | ICD-10-CM | POA: Diagnosis not present

## 2017-10-09 DIAGNOSIS — M545 Low back pain: Secondary | ICD-10-CM | POA: Diagnosis not present

## 2017-10-09 DIAGNOSIS — M1711 Unilateral primary osteoarthritis, right knee: Secondary | ICD-10-CM | POA: Diagnosis not present

## 2017-10-09 NOTE — H&P (Signed)
I have kidney stones.  HPI: Joanna Reid is a 52 year-old female established patient who is here for renal calculi.  The problem is on both sides. This is not her first kidney stone. Her first stone was 01/02/1998. She has had 5 stones prior to getting this one. She is not currently having flank pain, back pain, groin pain, nausea, vomiting, fever or chills. She has not caught a stone in her urine strainer since her symptoms began.   She has had eswl for treatment of her stones in the past.   ESWL, 1999   CT June 2018 -- 9 mm left upper pole stone, which is 11 cm ssd, HUI 962, visible.   CT today shows 12 mm left upper pole stone. It may be slightly larger. She has occasional left flank pain.     CC: Urinary Tract Infections  HPI: The patient occasionally gets a number of urinary tract infections. When the patient has a UTI she reports having the following symptoms: dysuria, frequency, back pain, flank pain, and hematuria. When asked how many UTI's the patient gets per year her response was always infected. The patient's symptoms do respond favorably to antibiotics.   H/o obesity, type II diabetic, perimenopausal, with re-Escherichia coli urinary tract infections, and kidney stones. She is status post hysterectomy in 1992, with oophorectomy in 2013. Medications include Allopurinol, as well as valsartan, and metformin.   Cystoscopy was negative in2018 as well as CT apart from kidney stones.   She was on NF prophylaxis. She continues NF. She is planning gastric sleeve.     ALLERGIES: None   MEDICATIONS: Allopurinol 300 mg tablet  Macrobid 100 mg capsule 1 capsule PO Daily take with food  Metformin Hcl 500 mg tablet  Omeprazole 20 mg tablet, delayed release  Bumetanide 1 mg tablet  Colace 100 mg capsule  Flonase Allergy Relief  Gabapentin  Hydrocodone-Acetaminophen 5 mg-325 mg tablet  Meloxicam 15 mg tablet PRN  Tizanidine Hcl  Verapamil Er 240 mg tablet, extended release     GU  PSH: Cystoscopy - 01/02/2017 ESWL Hysterectomy Locm 300-399Mg /Ml Iodine,1Ml - 01/02/2017    NON-GU PSH: Cesarean Delivery Salpingo Oophorectomy    GU PMH: Chronic cystitis (w/o hematuria) - 01/02/2017 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/2017 Renal calculus - 01/02/2017, - 12/17/2016 Chronic cystitis (with hematuria) - 12/17/2016    NON-GU PMH: Other specified diabetes mellitus with diabetic nephropathy - 01/02/2017 GERD Gout Hypercholesterolemia Hypertension Personal history of other endocrine, nutritional and metabolic disease    FAMILY HISTORY: ALS-parkinsonism/Dementia Complex 1 - Father Deceased - Father, Mother Dementia - Mother diabetic neuropathy - Father, Brother Microhematuria - Father, Brother nephrolithiasis - Mother, Father stroke - Mother   SOCIAL HISTORY: Marital Status: Married Preferred Language: English; Ethnicity: Not Hispanic Or Latino; Race: White Current Smoking Status: Patient smokes. Has smoked since 12/08/2006. Smokes 1/2 pack per day.   Tobacco Use Assessment Completed: Used Tobacco in last 30 days? Has never drank.  Drinks 2 caffeinated drinks per day. Patient's occupation Electronics engineer.     Notes: 1 daughter   REVIEW OF SYSTEMS:    GU Review Female:   Patient reports frequent urination, get up at night to urinate, and leakage of urine. Patient denies hard to postpone urination, burning /pain with urination, stream starts and stops, trouble starting your stream, have to strain to urinate, and being pregnant.  Gastrointestinal (Upper):   Patient denies nausea, vomiting, and indigestion/ heartburn.  Gastrointestinal (Lower):   Patient denies diarrhea and  constipation.  Constitutional:   Patient reports night sweats and fatigue. Patient denies fever and weight loss.  Skin:   Patient denies skin rash/ lesion and itching.  Eyes:   Patient reports blurred vision. Patient denies double vision.  Ears/ Nose/ Throat:    Patient denies sore throat and sinus problems.  Hematologic/Lymphatic:   Patient reports easy bruising. Patient denies swollen glands.  Cardiovascular:   Patient denies leg swelling and chest pains.  Respiratory:   Patient denies cough and shortness of breath.  Endocrine:   Patient denies excessive thirst.  Musculoskeletal:   Patient reports back pain and joint pain.   Neurological:   Patient denies headaches and dizziness.  Psychologic:   Patient denies anxiety and depression.   VITAL SIGNS:      09/27/2017 12:11 PM  Weight 274 lb / 124.28 kg  Height 64.5 in / 163.83 cm  BP 178/113 mmHg  Pulse 88 /min  Temperature 98.2 F / 36.7 C  BMI 46.3 kg/m   MULTI-SYSTEM PHYSICAL EXAMINATION:    Constitutional: Well-nourished. No physical deformities. Normally developed. Good grooming.  Neck: Neck symmetrical, not swollen. Normal tracheal position.  Respiratory: No labored breathing, no use of accessory muscles. Normal breath sounds.  Cardiovascular: Regular rate and rhythm. No murmur, no gallop. Normal temperature, normal extremity pulses, no swelling, no varicosities.  Neurologic / Psychiatric: Oriented to time, oriented to place, oriented to person. No depression, no anxiety, no agitation.  Gastrointestinal: No mass, no tenderness, no rigidity, non obese abdomen.     PAST DATA REVIEWED:  Source Of History:  Patient  Records Review:   Previous Doctor Records  X-Ray Review: C.T. Abdomen/Pelvis: Reviewed Films. june 2018   Notes:                     Dr. Darene Lamer notes, [place] notes.    PROCEDURES:         KUB - [id number]  A single view of the abdomen is obtained.  Calculi:  12 mm LUP stone       The bones appeared normal. The bowel gas pattern appeared normal. The soft tissues were unremarkable.         Urinalysis Dipstick Dipstick Cont'd  Color: Yellow Bilirubin: Neg  Appearance: Clear Ketones: Neg  Specific Gravity: 1.020 Blood: Neg  pH: 6.0 Protein: Trace  Glucose: Neg Urobilinogen: 0.2     Nitrites: Neg    Leukocyte Esterase: Neg    ASSESSMENT:      ICD-10 Details  1 GU:   Renal calculus - N20.0   2   Chronic cystitis (w/o hematuria) - N30.20 Stable   PLAN:           Schedule Return Visit/Planned Activity: Next Available Appointment - Schedule Surgery          Document Letter(s):  Created for Patient: Clinical Summary         Notes:   left renal stone - possible larger and pt with occasional left flank pain. I discussed with the patient the nature risks and benefits of continued surveillance, shockwave lithotripsy or ureteroscopy. All questions answered. She'll proceed with left ESWL. We discussed she may need a staged procedure. We also discussed one of my colleagues may be performing the procedure.   cystitis - will try and wean off NF once stone free.    After a thorough review of the management options for the patient's condition the patient  elected to proceed with surgical therapy as noted above.  We have discussed the potential benefits and risks of the procedure, side effects of the proposed treatment, the likelihood of the patient achieving the goals of the procedure, and any potential problems that might occur during the procedure or recuperation. Informed consent has been obtained.

## 2017-10-10 ENCOUNTER — Encounter (HOSPITAL_COMMUNITY)
Admission: RE | Disposition: A | Discharge status: Home / Self Care | Payer: Self-pay | Source: Ambulatory Visit | Attending: Urology

## 2017-10-10 ENCOUNTER — Ambulatory Visit (HOSPITAL_COMMUNITY)
Admission: RE | Admit: 2017-10-10 | Discharge: 2017-10-10 | Disposition: A | Discharge status: Home / Self Care | Payer: BLUE CROSS/BLUE SHIELD | Source: Ambulatory Visit | Attending: Urology | Admitting: Urology

## 2017-10-10 ENCOUNTER — Encounter (HOSPITAL_COMMUNITY): Payer: Self-pay | Admitting: General Practice

## 2017-10-10 ENCOUNTER — Ambulatory Visit (HOSPITAL_COMMUNITY): Payer: BLUE CROSS/BLUE SHIELD

## 2017-10-10 DIAGNOSIS — I10 Essential (primary) hypertension: Secondary | ICD-10-CM | POA: Diagnosis not present

## 2017-10-10 DIAGNOSIS — F17210 Nicotine dependence, cigarettes, uncomplicated: Secondary | ICD-10-CM | POA: Insufficient documentation

## 2017-10-10 DIAGNOSIS — N3020 Other chronic cystitis without hematuria: Secondary | ICD-10-CM | POA: Diagnosis not present

## 2017-10-10 DIAGNOSIS — E1121 Type 2 diabetes mellitus with diabetic nephropathy: Secondary | ICD-10-CM | POA: Insufficient documentation

## 2017-10-10 DIAGNOSIS — Z7984 Long term (current) use of oral hypoglycemic drugs: Secondary | ICD-10-CM | POA: Diagnosis not present

## 2017-10-10 DIAGNOSIS — Z6841 Body Mass Index (BMI) 40.0 and over, adult: Secondary | ICD-10-CM | POA: Diagnosis not present

## 2017-10-10 DIAGNOSIS — E669 Obesity, unspecified: Secondary | ICD-10-CM | POA: Diagnosis not present

## 2017-10-10 DIAGNOSIS — Z791 Long term (current) use of non-steroidal anti-inflammatories (NSAID): Secondary | ICD-10-CM | POA: Insufficient documentation

## 2017-10-10 DIAGNOSIS — N200 Calculus of kidney: Secondary | ICD-10-CM | POA: Insufficient documentation

## 2017-10-10 DIAGNOSIS — Z79899 Other long term (current) drug therapy: Secondary | ICD-10-CM | POA: Diagnosis not present

## 2017-10-10 HISTORY — PX: EXTRACORPOREAL SHOCK WAVE LITHOTRIPSY: SHX1557

## 2017-10-10 HISTORY — DX: Personal history of urinary calculi: Z87.442

## 2017-10-10 LAB — GLUCOSE, CAPILLARY: GLUCOSE-CAPILLARY: 154 mg/dL — AB (ref 65–99)

## 2017-10-10 SURGERY — LITHOTRIPSY, ESWL
Anesthesia: LOCAL | Laterality: Left

## 2017-10-10 MED ORDER — DIAZEPAM 5 MG PO TABS
10.0000 mg | ORAL_TABLET | ORAL | Status: AC
  Administered 2017-10-10: 10 mg via ORAL
  Filled 2017-10-10: qty 2

## 2017-10-10 MED ORDER — DIPHENHYDRAMINE HCL 25 MG PO CAPS
25.0000 mg | ORAL_CAPSULE | ORAL | Status: AC
  Administered 2017-10-10: 25 mg via ORAL
  Filled 2017-10-10: qty 1

## 2017-10-10 MED ORDER — CIPROFLOXACIN HCL 500 MG PO TABS
500.0000 mg | ORAL_TABLET | ORAL | Status: AC
  Administered 2017-10-10: 500 mg via ORAL
  Filled 2017-10-10: qty 1

## 2017-10-10 MED ORDER — SODIUM CHLORIDE 0.9 % IV SOLN
INTRAVENOUS | Status: DC
  Administered 2017-10-10: 08:00:00 via INTRAVENOUS

## 2017-10-10 NOTE — Discharge Instructions (Signed)
I have reviewed discharge instructions in detail with the patient. They will follow-up with me or their physician as scheduled. My nurse will also be calling the patients as per protocol.   

## 2017-10-10 NOTE — Interval H&P Note (Signed)
History and Physical Interval Note:  10/10/2017 8:49 AM  Joanna Reid  has presented today for surgery, with the diagnosis of LEFT KIDNEY STONE  The various methods of treatment have been discussed with the patient and family. After consideration of risks, benefits and other options for treatment, the patient has consented to  Procedure(s): LEFT EXTRACORPOREAL SHOCK WAVE LITHOTRIPSY (ESWL) (Left) as a surgical intervention .  The patient's history has been reviewed, patient examined, no change in status, stable for surgery.  I have reviewed the patient's chart and labs.  Questions were answered to the patient's satisfaction.     [name]

## 2017-10-14 ENCOUNTER — Other Ambulatory Visit: Payer: Self-pay | Admitting: Family Medicine

## 2017-10-14 ENCOUNTER — Encounter (HOSPITAL_COMMUNITY): Payer: Self-pay | Admitting: Urology

## 2017-10-14 ENCOUNTER — Other Ambulatory Visit: Payer: Self-pay | Admitting: Nurse Practitioner

## 2017-10-17 ENCOUNTER — Other Ambulatory Visit: Payer: Self-pay | Admitting: Family Medicine

## 2017-10-21 ENCOUNTER — Other Ambulatory Visit: Payer: Self-pay | Admitting: *Deleted

## 2017-10-24 ENCOUNTER — Encounter: Payer: Self-pay | Admitting: Family Medicine

## 2017-10-24 ENCOUNTER — Ambulatory Visit: Payer: BLUE CROSS/BLUE SHIELD | Admitting: Family Medicine

## 2017-10-24 ENCOUNTER — Other Ambulatory Visit (HOSPITAL_COMMUNITY)
Admission: RE | Admit: 2017-10-24 | Discharge: 2017-10-24 | Disposition: A | Discharge status: Home / Self Care | Payer: BLUE CROSS/BLUE SHIELD | Source: Ambulatory Visit | Attending: Family Medicine | Admitting: Family Medicine

## 2017-10-24 ENCOUNTER — Other Ambulatory Visit: Payer: Self-pay | Admitting: *Deleted

## 2017-10-24 ENCOUNTER — Ambulatory Visit (HOSPITAL_COMMUNITY)
Admission: RE | Admit: 2017-10-24 | Discharge: 2017-10-24 | Disposition: A | Discharge status: Home / Self Care | Payer: BLUE CROSS/BLUE SHIELD | Source: Ambulatory Visit | Attending: Family Medicine | Admitting: Family Medicine

## 2017-10-24 VITALS — BP 172/92 | Temp 98.2°F | Ht 62.75 in | Wt 277.0 lb

## 2017-10-24 DIAGNOSIS — R1011 Right upper quadrant pain: Secondary | ICD-10-CM | POA: Insufficient documentation

## 2017-10-24 DIAGNOSIS — K760 Fatty (change of) liver, not elsewhere classified: Secondary | ICD-10-CM | POA: Insufficient documentation

## 2017-10-24 DIAGNOSIS — R109 Unspecified abdominal pain: Secondary | ICD-10-CM

## 2017-10-24 DIAGNOSIS — N200 Calculus of kidney: Secondary | ICD-10-CM | POA: Diagnosis not present

## 2017-10-24 LAB — BASIC METABOLIC PANEL
ANION GAP: 13 (ref 5–15)
BUN: 14 mg/dL (ref 6–20)
CALCIUM: 10.3 mg/dL (ref 8.9–10.3)
CO2: 24 mmol/L (ref 22–32)
Chloride: 102 mmol/L (ref 101–111)
Creatinine, Ser: 0.91 mg/dL (ref 0.44–1.00)
Glucose, Bld: 174 mg/dL — ABNORMAL HIGH (ref 65–99)
Potassium: 3.8 mmol/L (ref 3.5–5.1)
SODIUM: 139 mmol/L (ref 135–145)

## 2017-10-24 LAB — CBC WITH DIFFERENTIAL/PLATELET
BASOS ABS: 0 10*3/uL (ref 0.0–0.1)
BASOS PCT: 0 %
EOS ABS: 0.1 10*3/uL (ref 0.0–0.7)
EOS PCT: 2 %
HCT: 27.7 % — ABNORMAL LOW (ref 36.0–46.0)
Hemoglobin: 8.9 g/dL — ABNORMAL LOW (ref 12.0–15.0)
LYMPHS PCT: 38 %
Lymphs Abs: 2.1 10*3/uL (ref 0.7–4.0)
MCH: 30.5 pg (ref 26.0–34.0)
MCHC: 32.1 g/dL (ref 30.0–36.0)
MCV: 94.9 fL (ref 78.0–100.0)
Monocytes Absolute: 0.5 10*3/uL (ref 0.1–1.0)
Monocytes Relative: 8 %
Neutro Abs: 2.8 10*3/uL (ref 1.7–7.7)
Neutrophils Relative %: 52 %
PLATELETS: 252 10*3/uL (ref 150–400)
RBC: 2.92 MIL/uL — AB (ref 3.87–5.11)
RDW: 16 % — AB (ref 11.5–15.5)
WBC Morphology: INCREASED
WBC: 5.5 10*3/uL (ref 4.0–10.5)

## 2017-10-24 LAB — HEPATIC FUNCTION PANEL
ALBUMIN: 3.6 g/dL (ref 3.5–5.0)
ALT: 63 U/L — ABNORMAL HIGH (ref 14–54)
AST: 72 U/L — AB (ref 15–41)
Alkaline Phosphatase: 92 U/L (ref 38–126)
Bilirubin, Direct: <0.1 mg/dL — ABNORMAL LOW (ref 0.1–0.5)
Total Bilirubin: 0.7 mg/dL (ref 0.3–1.2)
Total Protein: 8.3 g/dL — ABNORMAL HIGH (ref 6.5–8.1)

## 2017-10-24 LAB — AMYLASE: Amylase: 34 U/L (ref 28–100)

## 2017-10-24 LAB — POCT URINALYSIS DIPSTICK
PH UA: 6 (ref 5.0–8.0)
RBC UA: NEGATIVE
SPEC GRAV UA: 1.015 (ref 1.010–1.025)

## 2017-10-24 LAB — LIPASE, BLOOD: LIPASE: 30 U/L (ref 11–51)

## 2017-10-24 NOTE — Progress Notes (Signed)
   Subjective:    Patient ID: Joanna Reid, female    DOB: Jul 08, 1966, 52 y.o.   MRN: [id number]  HPI  Patient states she has been having sharp right side pain and nausea on going since last Saturday.States she saw urologist this am Dr.Eskridge(follow up from Lithotripsy) and they scanned her and she still has kidney stones. They also took urine samples.She states she does not know what the results of the urine and what it showed and they did not tell her if the pain was from the stones. She is concerned this maybe her appendix.She has been just resting trying to get the pain to go away. She does take Hydrocodone and Zanfex for her back,but states they are not helping the side pain.  Results for orders placed or performed during the hospital encounter of 10/10/17  Glucose, capillary  Result Value Ref Range   Glucose-Capillary 154 (H) 65 - 99 mg/dL   Comment 1 Notify RN    Comment 2 Document in Chart    Right lat abd pain, severe, takes breath away, going to the knees with the pain  Severe in nature  Had lithtripsy earlier in the mo, had lithotripsy, pt had to stop due to elev blood pressue   Came on fairly quickly  Sat morn  No fevers or chills   Change of position hurts  Does not feel like kidney stone pain    right ovary gone      Had a lot f pain with that, and hadb p go up    Review of Systems Positive nausea no vomiting no headache no cough no congestion no fever    Objective:   Physical Exam Alert considerable distress.  Neuro exam grossly intact.  HEENT normal.  Lungs clear.  No true CVA tenderness heart regular rate and rhythm.  Abdomen impressive tenderness right upper quadrant extending on up into the costal margin extremely tender.  Bowel sounds present.  No obvious rebound no guarding no abdomen diffuse mild tenderness no distinct tenderness urinalysis no red blood cells per high-power field  Impression severe pain/acute onset/right upper quadrant.  Some  radiation to lateral abdomen/chest  positive nausea.  Day and night pain.  Days duration.  Complicated by her recent lithotripsy.  But that was on the left side.  Positive anorexia.  Positive nausea no acute vomiting.  Concerning presentation.  Stat blood work.  Plus stat right upper quadrant ultrasound.  Addendum see results.  White blood count normal glucose is substantially elevated.  Hemoglobin substantial anemia.  This will need to be addressed, however, acute abdomen work-up more urgent discussed no GI blood loss per patient.  Further history obtained after results return.  We will press on now and do a CT scan of the abdomen and pelvis rationale discussed.  If this is negative with patient's history of severe spinal nerve pain/inflammation this could be theoretically radiating pain from the spine however with distinct tenderness right upper quadrant CT warranted and important discussed  Greater than 50% of this 40 minute face to face visit was spent in counseling and discussion and coordination of care regarding the above diagnosis/diagnosies    Also will do Hemoccult cards/anemia work up to commence after acute abd w u complete Assessment & Plan:

## 2017-10-25 ENCOUNTER — Other Ambulatory Visit: Payer: Self-pay

## 2017-10-25 ENCOUNTER — Emergency Department (HOSPITAL_COMMUNITY): Payer: BLUE CROSS/BLUE SHIELD

## 2017-10-25 ENCOUNTER — Encounter (HOSPITAL_COMMUNITY): Payer: Self-pay | Admitting: Emergency Medicine

## 2017-10-25 ENCOUNTER — Emergency Department (HOSPITAL_COMMUNITY)
Admission: EM | Admit: 2017-10-25 | Discharge: 2017-10-25 | Disposition: A | Discharge status: Home / Self Care | Payer: BLUE CROSS/BLUE SHIELD | Attending: Emergency Medicine | Admitting: Emergency Medicine

## 2017-10-25 DIAGNOSIS — Z87891 Personal history of nicotine dependence: Secondary | ICD-10-CM | POA: Diagnosis not present

## 2017-10-25 DIAGNOSIS — Z79899 Other long term (current) drug therapy: Secondary | ICD-10-CM | POA: Insufficient documentation

## 2017-10-25 DIAGNOSIS — Z87442 Personal history of urinary calculi: Secondary | ICD-10-CM | POA: Diagnosis not present

## 2017-10-25 DIAGNOSIS — Z7984 Long term (current) use of oral hypoglycemic drugs: Secondary | ICD-10-CM | POA: Diagnosis not present

## 2017-10-25 DIAGNOSIS — I10 Essential (primary) hypertension: Secondary | ICD-10-CM | POA: Insufficient documentation

## 2017-10-25 DIAGNOSIS — D649 Anemia, unspecified: Secondary | ICD-10-CM

## 2017-10-25 DIAGNOSIS — R1030 Lower abdominal pain, unspecified: Secondary | ICD-10-CM

## 2017-10-25 DIAGNOSIS — R109 Unspecified abdominal pain: Secondary | ICD-10-CM | POA: Diagnosis not present

## 2017-10-25 DIAGNOSIS — N132 Hydronephrosis with renal and ureteral calculous obstruction: Secondary | ICD-10-CM | POA: Diagnosis not present

## 2017-10-25 DIAGNOSIS — E119 Type 2 diabetes mellitus without complications: Secondary | ICD-10-CM | POA: Insufficient documentation

## 2017-10-25 DIAGNOSIS — R1084 Generalized abdominal pain: Secondary | ICD-10-CM | POA: Diagnosis not present

## 2017-10-25 LAB — URINALYSIS, ROUTINE W REFLEX MICROSCOPIC
BILIRUBIN URINE: NEGATIVE
Glucose, UA: NEGATIVE mg/dL
Ketones, ur: NEGATIVE mg/dL
LEUKOCYTES UA: NEGATIVE
Nitrite: NEGATIVE
PH: 7 (ref 5.0–8.0)
Protein, ur: NEGATIVE mg/dL
Specific Gravity, Urine: 1.027 (ref 1.005–1.030)

## 2017-10-25 LAB — COMPREHENSIVE METABOLIC PANEL
ALBUMIN: 3.6 g/dL (ref 3.5–5.0)
ALT: 56 U/L — AB (ref 14–54)
AST: 59 U/L — AB (ref 15–41)
Alkaline Phosphatase: 88 U/L (ref 38–126)
Anion gap: 13 (ref 5–15)
BUN: 15 mg/dL (ref 6–20)
CHLORIDE: 102 mmol/L (ref 101–111)
CO2: 23 mmol/L (ref 22–32)
CREATININE: 0.89 mg/dL (ref 0.44–1.00)
Calcium: 10.6 mg/dL — ABNORMAL HIGH (ref 8.9–10.3)
GFR calc Af Amer: >60 mL/min (ref >60)
GFR calc non Af Amer: >60 mL/min (ref >60)
Glucose, Bld: 164 mg/dL — ABNORMAL HIGH (ref 65–99)
Potassium: 3.9 mmol/L (ref 3.5–5.1)
Sodium: 138 mmol/L (ref 135–145)
Total Bilirubin: 0.7 mg/dL (ref 0.3–1.2)
Total Protein: 8.4 g/dL — ABNORMAL HIGH (ref 6.5–8.1)

## 2017-10-25 LAB — CBC WITH DIFFERENTIAL/PLATELET
BASOS ABS: 0 10*3/uL (ref 0.0–0.1)
BASOS PCT: 0 %
EOS ABS: 0.1 10*3/uL (ref 0.0–0.7)
Eosinophils Relative: 1 %
HCT: 26.7 % — ABNORMAL LOW (ref 36.0–46.0)
Hemoglobin: 8.7 g/dL — ABNORMAL LOW (ref 12.0–15.0)
LYMPHS PCT: 35 %
Lymphs Abs: 1.7 10*3/uL (ref 0.7–4.0)
MCH: 30.7 pg (ref 26.0–34.0)
MCHC: 32.6 g/dL (ref 30.0–36.0)
MCV: 94.3 fL (ref 78.0–100.0)
Monocytes Absolute: 0.4 10*3/uL (ref 0.1–1.0)
Monocytes Relative: 9 %
NEUTROS PCT: 55 %
Neutro Abs: 2.7 10*3/uL (ref 1.7–7.7)
PLATELETS: 213 10*3/uL (ref 150–400)
RBC: 2.83 MIL/uL — AB (ref 3.87–5.11)
RDW: 15.8 % — ABNORMAL HIGH (ref 11.5–15.5)
WBC: 4.9 10*3/uL (ref 4.0–10.5)

## 2017-10-25 LAB — LIPASE, BLOOD: Lipase: 30 U/L (ref 11–51)

## 2017-10-25 MED ORDER — HYDROMORPHONE HCL 1 MG/ML IJ SOLN
1.0000 mg | Freq: Once | INTRAMUSCULAR | Status: AC
  Administered 2017-10-25: 1 mg via INTRAVENOUS
  Filled 2017-10-25: qty 1

## 2017-10-25 MED ORDER — IOPAMIDOL (ISOVUE-300) INJECTION 61%
100.0000 mL | Freq: Once | INTRAVENOUS | Status: AC | PRN
  Administered 2017-10-25: 100 mL via INTRAVENOUS

## 2017-10-25 MED ORDER — HYDROCODONE-ACETAMINOPHEN 5-325 MG PO TABS: 1.0000 | ORAL_TABLET | Freq: Four times a day (QID) | ORAL | 0 refills | Status: DC | PRN

## 2017-10-25 MED ORDER — SODIUM CHLORIDE 0.9 % IV BOLUS
1000.0000 mL | Freq: Once | INTRAVENOUS | Status: AC
  Administered 2017-10-25: 1000 mL via INTRAVENOUS

## 2017-10-25 MED ORDER — ONDANSETRON HCL 4 MG/2ML IJ SOLN
4.0000 mg | Freq: Once | INTRAMUSCULAR | Status: AC
  Administered 2017-10-25: 4 mg via INTRAVENOUS
  Filled 2017-10-25: qty 2

## 2017-10-25 NOTE — ED Triage Notes (Signed)
PT c/o continued RLQ pain x6 days with nausea. PT denies any urinary symptoms. PT states she had an outpatient ultrasound that was negative this week. PT states her insurance denied the CT scan and she was told to come to ED if pain worsening.

## 2017-10-25 NOTE — ED Provider Notes (Signed)
[facility] EMERGENCY DEPARTMENT Provider Note   CSN: [id number] Arrival date & time: 10/25/17  0945     History   Chief Complaint Chief Complaint  Patient presents with  . Abdominal Pain    HPI Joanna Reid is a 52 y.o. female.  Patient complains of abdominal pain.  She was sent over here by her primary care doctor for CAT scan.  She has a known renal stone on left side that she saw urologist yesterday for  The history is provided by the patient.  Abdominal Pain   This is a new problem. The current episode started more than 1 week ago. The problem occurs constantly. The problem has not changed since onset.The pain is located in the generalized abdominal region. The quality of the pain is aching. The pain is at a severity of 5/10. The pain is moderate. Pertinent negatives include diarrhea, flatus, frequency, hematuria and headaches. Nothing aggravates the symptoms. Nothing relieves the symptoms.    Past Medical History:  Diagnosis Date  . Acid reflux   . Allergic rhinitis   . Diabetes mellitus    type 2  . Gout   . Gout   . HBP (high blood pressure)   . History of kidney stones   . Migraines     Patient Active Problem List   Diagnosis Date Noted  . Type 2 diabetes mellitus without complication, without long-term current use of insulin ([place]) 01/27/2016  . Morbid obesity due to excess calories ([place]) 01/27/2016  . Anxiety as acute reaction to exceptional stress 01/13/2015  . Attention deficit hyperactivity disorder (ADHD), 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  . Gout 01/20/2014  . GERD (gastroesophageal reflux disease) 01/20/2014  . Essential hypertension, benign 01/20/2014  . Plantar fasciitis of left foot 09/19/2011    Past Surgical History:  Procedure Laterality Date  . CESAREAN SECTION    . EXTRACORPOREAL SHOCK WAVE LITHOTRIPSY Left 10/10/2017   Procedure: LEFT EXTRACORPOREAL SHOCK WAVE LITHOTRIPSY (ESWL);  Surgeon: Bjorn Loser, MD;  Location: [facility];  Service:  Urology;  Laterality: Left;  . EYE SURGERY    . HEMORRHOID SURGERY N/A 11/19/2012   Procedure: HEMORRHOIDECTOMY;  Surgeon: Jamesetta So, MD;  Location: [facility];  Service: General;  Laterality: N/A;  . kidney stones  1998  . LAPAROSCOPIC UNILATERAL SALPINGO OOPHERECTOMY  05/14/2012   Procedure: LAPAROSCOPIC UNILATERAL SALPINGO OOPHORECTOMY;  Surgeon: [name], MD;  Location: [facility];  Service: Gynecology;  Laterality: Right;  laparoscopic right salpingo-oophorectomy  . PARTIAL HYSTERECTOMY    . TONSILECTOMY, ADENOIDECTOMY, BILATERAL MYRINGOTOMY AND TUBES    . VESICOVAGINAL FISTULA CLOSURE W/ TAH       OB History   None      Home Medications    Prior to Admission medications   Medication Sig Start Date End Date Taking? Authorizing Provider  albuterol (PROVENTIL HFA;VENTOLIN HFA) 108 (90 Base) MCG/ACT inhaler Inhale 1-2 puffs into the lungs daily as needed. 07/18/17  Yes Mikey Kirschner, MD  allopurinol (ZYLOPRIM) 300 MG tablet TAKE 1 TABLET BY MOUTH  DAILY FOR GOUT 10/02/17  Yes Nilda Simmer, NP  bumetanide (BUMEX) 1 MG tablet Take 1 tablet (1 mg total) by mouth daily. 07/18/17  Yes Mikey Kirschner, MD  fluticasone (FLONASE) 50 MCG/ACT nasal spray Place 2 sprays into the nose 2 (two) times daily.   Yes [provider]  gabapentin (NEURONTIN) 300 MG capsule  03/21/17  Yes [provider]  HYDROcodone-acetaminophen (NORCO/VICODIN) 5-325 MG tablet Take 1 tablet by  mouth every 6 (six) hours as needed for moderate pain. 04/01/17  Yes Mikey Kirschner, MD  losartan (COZAAR) 100 MG tablet Take 1 tablet (100 mg total) by mouth daily. 07/18/17  Yes Mikey Kirschner, MD  meloxicam (MOBIC) 15 MG tablet TAKE 1 TABLET BY MOUTH ONCE DAILY 08/23/17  Yes Pearson Forster C, NP  metFORMIN (GLUCOPHAGE) 500 MG tablet TAKE 1 TABLET BY MOUTH ONCE DAILY WITH BREAKFAST 10/02/17  Yes Nilda Simmer, NP  nitrofurantoin, macrocrystal-monohydrate, (MACROBID) 100 MG capsule Take 100 mg  by mouth daily.  02/03/17  Yes [provider]  omeprazole (PRILOSEC) 20 MG capsule TAKE 1 CAPSULE BY MOUTH  DAILY 10/02/17  Yes Pearson Forster C, NP  polyethylene glycol powder (GLYCOLAX/MIRALAX) powder Take 1 Container by mouth daily.   Yes [provider]  tiZANidine (ZANAFLEX) 4 MG capsule Take 4 mg by mouth every 8 (eight) hours.   Yes [provider]  valACYclovir (VALTREX) 1000 MG tablet Take 2 tablets now and 2 tablets 12 hours later Patient taking differently: Take 1,000 mg by mouth daily as needed (cold sore). Take 2 tablets now and 2 tablets 12 hours later 10/16/16  Yes Mikey Kirschner, MD  verapamil (CALAN-SR) 240 MG CR tablet TAKE 1 TABLET BY MOUTH AT  BEDTIME 10/02/17  Yes Nilda Simmer, NP    Family History Family History  Problem Relation Age of Onset  . Arthritis Unknown   . Cancer Unknown   . Diabetes Unknown   . Hypertension Mother   . Diabetes Father   . Diabetes Brother   . Hypertension Brother     Social History Social History   Tobacco Use  . Smoking status: Former Smoker    Last attempt to quit: 02/27/1999    Years since quitting: 18.6  . Smokeless tobacco: Never Used  . Tobacco comment: socially   Substance Use Topics  . Alcohol use: No    Alcohol/week: 0.0 oz  . Drug use: No     Allergies   Patient has no known allergies.   Review of Systems Review of Systems  Constitutional: Negative for appetite change and fatigue.  HENT: Negative for congestion, ear discharge and sinus pressure.   Eyes: Negative for discharge.  Respiratory: Negative for cough.   Cardiovascular: Negative for chest pain.  Gastrointestinal: Positive for abdominal pain. Negative for diarrhea and flatus.  Genitourinary: Negative for frequency and hematuria.  Musculoskeletal: Negative for back pain.  Skin: Negative for rash.  Neurological: Negative for seizures and headaches.  Psychiatric/Behavioral: Negative for hallucinations.      Physical Exam Updated Vital Signs BP (!) 188/97   Pulse 79   Temp 97.9 F (36.6 C) (Oral)   Resp 18   Ht 5' 2"  (1.575 m)   Wt 124.7 kg (275 lb)   SpO2 99%   BMI 50.30 kg/m   Physical Exam  Constitutional: She is oriented to person, place, and time. She appears well-developed.  HENT:  Head: Normocephalic.  Eyes: Conjunctivae and EOM are normal. No scleral icterus.  Neck: Neck supple. No thyromegaly present.  Cardiovascular: Normal rate and regular rhythm. Exam reveals no gallop and no friction rub.  No murmur heard. Pulmonary/Chest: No stridor. She has no wheezes. She has no rales. She exhibits no tenderness.  Abdominal: She exhibits no distension. There is tenderness. There is no rebound.  Musculoskeletal: Normal range of motion. She exhibits no edema.  Lymphadenopathy:    She has no cervical adenopathy.  Neurological: She is  oriented to person, place, and time. She exhibits normal muscle tone. Coordination normal.  Skin: No rash noted. No erythema.  Psychiatric: She has a normal mood and affect. Her behavior is normal.     ED Treatments / Results  Labs (all labs ordered are listed, but only abnormal results are displayed) Labs Reviewed  CBC WITH DIFFERENTIAL/PLATELET - Abnormal; Notable for the following components:      Result Value   RBC 2.83 (*)    Hemoglobin 8.7 (*)    HCT 26.7 (*)    RDW 15.8 (*)    All other components within normal limits  COMPREHENSIVE METABOLIC PANEL - Abnormal; Notable for the following components:   Glucose, Bld 164 (*)    Calcium 10.6 (*)    Total Protein 8.4 (*)    AST 59 (*)    ALT 56 (*)    All other components within normal limits  URINALYSIS, ROUTINE W REFLEX MICROSCOPIC - Abnormal; Notable for the following components:   Hgb urine dipstick LARGE (*)    Bacteria, UA RARE (*)    Squamous Epithelial / LPF 0-5 (*)    All other components within normal limits  LIPASE, BLOOD  PROTEIN ELECTROPHORESIS, SERUM     EKG None  Radiology Ct Abdomen Pelvis W Contrast  Result Date: 10/25/2017 CLINICAL DATA:  Right lower quadrant abdominal pain for the past week. EXAM: CT ABDOMEN AND PELVIS WITH CONTRAST TECHNIQUE: Multidetector CT imaging of the abdomen and pelvis was performed using the standard protocol following bolus administration of intravenous contrast. CONTRAST:  162m ISOVUE-300 IOPAMIDOL (ISOVUE-300) INJECTION 61% COMPARISON:  Right upper quadrant ultrasound from yesterday. CT abdomen pelvis dated January 02, 2017. FINDINGS: Lower chest: No acute abnormality.  Bibasilar atelectasis/scarring. Hepatobiliary: Diffuse hepatic steatosis. No focal liver abnormality. The gallbladder is unremarkable. No biliary dilatation. Pancreas: Unremarkable. No pancreatic ductal dilatation or surrounding inflammatory changes. Spleen: Normal in size without focal abnormality. Adrenals/Urinary Tract: The adrenal glands are unremarkable. 13 mm calculus in the proximal left ureter with resultant mild left hydronephrosis. Additional nonobstructive left-sided calculi measuring up to 11 mm. Small nonobstructive right renal calculi measuring up to 6 mm. No right hydronephrosis. The bladder is unremarkable. Stomach/Bowel: Stomach is within normal limits. Appendix appears normal. No evidence of bowel wall thickening, distention, or inflammatory changes. Vascular/Lymphatic: Aortic atherosclerosis. No enlarged abdominal or pelvic lymph nodes. Reproductive: The uterus and right ovary are surgically absent. Normal appearance of the left ovary. Other: Unchanged small fat containing umbilical hernia. No free fluid or pneumoperitoneum. Musculoskeletal: New innumerable small lytic lesions throughout the visualized thoracolumbar spine, ribs, and pelvis. New mild superior endplate compression fracture of T12. IMPRESSION: 1. 13 mm calculus in the proximal left ureter with resultant mild left hydronephrosis. 2. New innumerable small lytic lesions  throughout the visualized axial skeleton, concerning for disseminated multiple myeloma. 3. New mild superior endplate compression fracture of T12. 4. Diffuse hepatic steatosis. 5.  Aortic atherosclerosis (ICD10-I70.0). Electronically Signed   By: WTitus DubinM.D.   On: 10/25/2017 11:35   UKoreaAbdomen Limited Ruq  Result Date: 10/24/2017 CLINICAL DATA:  Right upper quadrant pain and tenderness EXAM: ULTRASOUND ABDOMEN LIMITED RIGHT UPPER QUADRANT COMPARISON:  None. FINDINGS: Gallbladder: No gallstones or wall thickening visualized. No sonographic Murphy sign noted by sonographer. Common bile duct: Diameter: 5.3 mm Liver: No focal lesion identified. Small area of hypoechogenicity adjacent to the gallbladder fossa likely reflecting focal fatty sparing. Increased hepatic parenchymal echogenicity. Portal vein is patent on color Doppler imaging with  normal direction of blood flow towards the liver. IMPRESSION: 1. No cholelithiasis or sonographic evidence of acute cholecystitis. 2. Hepatic steatosis. Electronically Signed   By: Kathreen Devoid   On: 10/24/2017 16:48    Procedures Procedures (including critical care time)  Medications Ordered in ED Medications  HYDROmorphone (DILAUDID) injection 1 mg (1 mg Intravenous Given 10/25/17 1024)  ondansetron (ZOFRAN) injection 4 mg (4 mg Intravenous Given 10/25/17 1024)  sodium chloride 0.9 % bolus 1,000 mL (0 mLs Intravenous Stopped 10/25/17 1241)  iopamidol (ISOVUE-300) 61 % injection 100 mL (100 mLs Intravenous Contrast Given 10/25/17 1056)     Initial Impression / Assessment and Plan / ED Course  I have reviewed the triage vital signs and the nursing notes.  Pertinent labs & imaging results that were available during my care of the patient were reviewed by me and considered in my medical decision making (see chart for details).     Patient with abdominal pain and known kidney stone.  CT scan shows also lytic lesions in her bones.  This could be multiple  myeloma.  I spoke with oncology Dr.Katragadda and he recommended getting serum protein which pheresis and they will follow-up next week.  Patient will be discharged home with pain medicine  Final Clinical Impressions(s) / ED Diagnoses   Final diagnoses:  None    ED Discharge Orders    None       Milton Ferguson, MD 10/25/17 1308

## 2017-10-25 NOTE — Discharge Instructions (Addendum)
Follow up with Dr. Delton Coombes next week.  You should be contacted by his office next week but if you do not hear from them by Tuesday go ahead and call and get that appointment set up

## 2017-10-28 ENCOUNTER — Encounter: Payer: Self-pay | Admitting: Family Medicine

## 2017-10-28 ENCOUNTER — Telehealth: Payer: Self-pay

## 2017-10-28 ENCOUNTER — Telehealth: Payer: Self-pay | Admitting: *Deleted

## 2017-10-28 ENCOUNTER — Other Ambulatory Visit: Payer: Self-pay | Admitting: *Deleted

## 2017-10-28 LAB — PROTEIN ELECTROPHORESIS, SERUM
A/G RATIO SPE: 0.8 (ref 0.7–1.7)
ALPHA-2-GLOBULIN: 0.8 g/dL (ref 0.4–1.0)
Albumin ELP: 3.4 g/dL (ref 2.9–4.4)
Alpha-1-Globulin: 0.3 g/dL (ref 0.0–0.4)
BETA GLOBULIN: 1 g/dL (ref 0.7–1.3)
Gamma Globulin: 2.2 g/dL — ABNORMAL HIGH (ref 0.4–1.8)
Globulin, Total: 4.2 g/dL — ABNORMAL HIGH (ref 2.2–3.9)
M-Spike, %: 1.5 g/dL — ABNORMAL HIGH
Total Protein ELP: 7.6 g/dL (ref 6.0–8.5)

## 2017-10-28 MED ORDER — DIAZEPAM 5 MG PO TABS: ORAL_TABLET | ORAL | 0 refills | Status: DC

## 2017-10-28 MED ORDER — VALACYCLOVIR HCL 1 G PO TABS: ORAL_TABLET | ORAL | 11 refills | Status: DC

## 2017-10-28 NOTE — Telephone Encounter (Signed)
Dr. Richardson Landry spoke with pt on the phone. Per dr Richardson Landry diapzepam 5mg  #30 one qhs prn anxiety or insomnia. And work excuse for next 2 weeks. Called pt to let her know rx was ready. She wants it faxed to [facility] in [place]. She states she will pick up work excuse tomorrow. rx faxed to [facility].

## 2017-10-28 NOTE — Telephone Encounter (Signed)
Excuse complete.

## 2017-10-28 NOTE — Telephone Encounter (Signed)
I spoke with the pt as you had requested and she states she would like you to call her at lunch. She wants to be referred to [facility] oncology and also will need a work note. I advised that you would be calling around lunch today.

## 2017-10-29 ENCOUNTER — Encounter (HOSPITAL_COMMUNITY): Payer: Self-pay | Admitting: Hematology

## 2017-10-29 ENCOUNTER — Other Ambulatory Visit: Payer: Self-pay

## 2017-10-29 ENCOUNTER — Ambulatory Visit (HOSPITAL_COMMUNITY)
Admission: RE | Admit: 2017-10-29 | Discharge: 2017-10-29 | Disposition: A | Discharge status: Home / Self Care | Payer: BLUE CROSS/BLUE SHIELD | Source: Ambulatory Visit | Attending: Hematology | Admitting: Hematology

## 2017-10-29 ENCOUNTER — Inpatient Hospital Stay (HOSPITAL_COMMUNITY): Payer: BLUE CROSS/BLUE SHIELD

## 2017-10-29 ENCOUNTER — Inpatient Hospital Stay (HOSPITAL_COMMUNITY): Payer: BLUE CROSS/BLUE SHIELD | Attending: Hematology | Admitting: Hematology

## 2017-10-29 ENCOUNTER — Encounter (HOSPITAL_COMMUNITY): Payer: Self-pay

## 2017-10-29 VITALS — BP 174/92 | HR 101 | Temp 97.4°F | Resp 20

## 2017-10-29 DIAGNOSIS — I10 Essential (primary) hypertension: Secondary | ICD-10-CM | POA: Diagnosis not present

## 2017-10-29 DIAGNOSIS — M5031 Other cervical disc degeneration,  high cervical region: Secondary | ICD-10-CM | POA: Diagnosis not present

## 2017-10-29 DIAGNOSIS — K5900 Constipation, unspecified: Secondary | ICD-10-CM | POA: Insufficient documentation

## 2017-10-29 DIAGNOSIS — M549 Dorsalgia, unspecified: Secondary | ICD-10-CM

## 2017-10-29 DIAGNOSIS — C9000 Multiple myeloma not having achieved remission: Secondary | ICD-10-CM

## 2017-10-29 DIAGNOSIS — M899 Disorder of bone, unspecified: Secondary | ICD-10-CM | POA: Diagnosis not present

## 2017-10-29 DIAGNOSIS — M545 Low back pain: Secondary | ICD-10-CM | POA: Diagnosis not present

## 2017-10-29 DIAGNOSIS — E119 Type 2 diabetes mellitus without complications: Secondary | ICD-10-CM

## 2017-10-29 DIAGNOSIS — M4854XA Collapsed vertebra, not elsewhere classified, thoracic region, initial encounter for fracture: Secondary | ICD-10-CM | POA: Diagnosis not present

## 2017-10-29 HISTORY — DX: Multiple myeloma not having achieved remission: C90.00

## 2017-10-29 LAB — CBC
HEMATOCRIT: 29.8 % — AB (ref 36.0–46.0)
Hemoglobin: 9.7 g/dL — ABNORMAL LOW (ref 12.0–15.0)
MCH: 30.4 pg (ref 26.0–34.0)
MCHC: 32.6 g/dL (ref 30.0–36.0)
MCV: 93.4 fL (ref 78.0–100.0)
PLATELETS: 258 10*3/uL (ref 150–400)
RBC: 3.19 MIL/uL — ABNORMAL LOW (ref 3.87–5.11)
RDW: 16 % — AB (ref 11.5–15.5)
WBC: 5.8 10*3/uL (ref 4.0–10.5)

## 2017-10-29 LAB — COMPREHENSIVE METABOLIC PANEL
ALBUMIN: 3.8 g/dL (ref 3.5–5.0)
ALK PHOS: 93 U/L (ref 38–126)
ALT: 71 U/L — AB (ref 14–54)
AST: 92 U/L — AB (ref 15–41)
Anion gap: 16 — ABNORMAL HIGH (ref 5–15)
BILIRUBIN TOTAL: 0.7 mg/dL (ref 0.3–1.2)
BUN: 15 mg/dL (ref 6–20)
CO2: 20 mmol/L — ABNORMAL LOW (ref 22–32)
CREATININE: 0.91 mg/dL (ref 0.44–1.00)
Calcium: 10.8 mg/dL — ABNORMAL HIGH (ref 8.9–10.3)
Chloride: 102 mmol/L (ref 101–111)
GFR calc Af Amer: >60 mL/min (ref >60)
GLUCOSE: 146 mg/dL — AB (ref 65–99)
Potassium: 3.9 mmol/L (ref 3.5–5.1)
Sodium: 138 mmol/L (ref 135–145)
TOTAL PROTEIN: 8.8 g/dL — AB (ref 6.5–8.1)

## 2017-10-29 LAB — LACTATE DEHYDROGENASE: LDH: 251 U/L — ABNORMAL HIGH (ref 98–192)

## 2017-10-29 MED ORDER — SODIUM CHLORIDE 0.9 % IV SOLN
INTRAVENOUS | Status: DC
  Administered 2017-10-29: 15:00:00 via INTRAVENOUS

## 2017-10-29 MED ORDER — ZOLEDRONIC ACID 4 MG/100ML IV SOLN
4.0000 mg | Freq: Once | INTRAVENOUS | Status: AC
  Administered 2017-10-29: 4 mg via INTRAVENOUS
  Filled 2017-10-29: qty 100

## 2017-10-29 NOTE — Patient Instructions (Signed)
[facility] at [facility] Discharge Instructions  Today you saw Dr. K.   Thank you for choosing [facility] at [facility] to provide your oncology and hematology care.  To afford each patient quality time with our provider, please arrive at least 15 minutes before your scheduled appointment time.   If you have a lab appointment with the Cancer Center please come in thru the  Main Entrance and check in at the main information desk  You need to re-schedule your appointment should you arrive 10 or more minutes late.  We strive to give you quality time with our providers, and arriving late affects you and other patients whose appointments are after yours.  Also, if you no show three or more times for appointments you may be dismissed from the clinic at the providers discretion.     Again, thank you for choosing [facility].  Our hope is that these requests will decrease the amount of time that you wait before being seen by our physicians.       _____________________________________________________________  Should you have questions after your visit to [facility], please contact our office at [phone] between the hours of 8:30 a.m. and 4:30 p.m.  Voicemails left after 4:30 p.m. will not be returned until the following business day.  For prescription refill requests, have your pharmacy contact our office.       Resources For Cancer Patients and their Caregivers ? American Cancer Society: Can assist with transportation, wigs, general needs, runs Look Good Feel Better.        1-888-227-6333 ? Cancer Care: Provides financial assistance, online support groups, medication/co-pay assistance.  1-800-813-HOPE (4673) ? [facility] Assists [place] cancer patients and their families through emotional , educational and financial support.  336-427-4357 ? [place] DSS Where to apply for food  stamps, Medicaid and utility assistance. 336-342-1394 ? [facility]: Transportation to medical appointments. 336-347-2287 ? Social Security Administration: May apply for disability if have a Stage IV cancer. 336-342-7796 1-800-772-1213 ? [place] Aging, Disability and Transit Services: Assists with nutrition, care and transit needs. 336-349-2343  Cancer Center Support Programs:   > Cancer Support Group  2nd Tuesday of the month 1pm-2pm, Journey Room   > Creative Journey  3rd Tuesday of the month 1130am-1pm, Journey Room    

## 2017-10-29 NOTE — Assessment & Plan Note (Signed)
1.  Presumed multiple myeloma: - Presentation with lower back pain since March, presented to the emergency room on 10/25/2017 with right groin pain, CT scan showed multiple lytic lesions - SPEP shows 1.5 g/dL of monoclonal protein - Will obtain serum immunofixation and free light chain assay, 24-hour urine for total protein and immunofixation, metastatic skeletal survey, LDH and beta-2 microglobulin -We will repeat CMP today as her calcium was elevated at 10.6 in the ER a few days ago, will likely need IV fluids and zoledronic acid -We will set up bone marrow aspiration biopsy as soon as possible, for studies including flow cytometry, chromosome analysis and FISH panel -Discussed with patient and her husband in detail.  I have personally reviewed the findings on the CT scan with the patient. 

## 2017-10-29 NOTE — Progress Notes (Signed)
[facility] NOTE  Patient Care Team: Mikey Kirschner, MD as PCP - General (Family Medicine)  CHIEF COMPLAINTS/PURPOSE OF CONSULTATION:  Possible multiple myeloma.  HISTORY OF PRESENTING ILLNESS:  Joanna Reid 52 y.o. female is seen in consultation today for possible multiple myeloma.  I was contacted by the ER physician on Saturday for possible lytic lesions on CT scans.  She presented to the emergency room with right groin pain.  She has been having lower back pain since March.  She was seen by pain specialist, who apparently ordered MRI of her back.  She denies any weakness in her extremities.  She denies any loss of control of bowel or bladder function.  And SPEP was sent from the emergency room.  It came back as 1.5 g/dL of monoclonal protein.  She denies any family history of monoclonal gammopathy's or leukemias.  She had upper back pain since last July, was evaluated with x-rays and was seen by pain specialist.  She denies any fevers, night sweats or weight loss.  MEDICAL HISTORY:  Past Medical History:  Diagnosis Date  . Acid reflux   . Allergic rhinitis   . Diabetes mellitus    type 2  . Gout   . Gout   . HBP (high blood pressure)   . History of kidney stones   . Migraines     SURGICAL HISTORY: Past Surgical History:  Procedure Laterality Date  . CESAREAN SECTION    . EXTRACORPOREAL SHOCK WAVE LITHOTRIPSY Left 10/10/2017   Procedure: LEFT EXTRACORPOREAL SHOCK WAVE LITHOTRIPSY (ESWL);  Surgeon: Bjorn Loser, MD;  Location: [facility];  Service: Urology;  Laterality: Left;  . EYE SURGERY    . HEMORRHOID SURGERY N/A 11/19/2012   Procedure: HEMORRHOIDECTOMY;  Surgeon: Jamesetta So, MD;  Location: [facility];  Service: General;  Laterality: N/A;  . kidney stones  1998  . LAPAROSCOPIC UNILATERAL SALPINGO OOPHERECTOMY  05/14/2012   Procedure: LAPAROSCOPIC UNILATERAL SALPINGO OOPHORECTOMY;  Surgeon: [name], MD;  Location: [facility];  Service:  Gynecology;  Laterality: Right;  laparoscopic right salpingo-oophorectomy  . PARTIAL HYSTERECTOMY    . TONSILECTOMY, ADENOIDECTOMY, BILATERAL MYRINGOTOMY AND TUBES    . VESICOVAGINAL FISTULA CLOSURE W/ TAH      SOCIAL HISTORY: Social History   Socioeconomic History  . Marital status: Married    Spouse name: Not on file  . Number of children: Not on file  . Years of education: 51  . Highest education level: Not on file  Occupational History    Employer: [employer]  . Financial resource strain: Not on file  . Food insecurity:    Worry: Never true    Inability: Never true  . Transportation needs:    Medical: Not on file    Non-medical: Not on file  Tobacco Use  . Smoking status: Former Smoker    Last attempt to quit: 02/27/1999    Years since quitting: 18.6  . Smokeless tobacco: Never Used  . Tobacco comment: socially   Substance and Sexual Activity  . Alcohol use: No    Alcohol/week: 0.0 oz  . Drug use: No  . Sexual activity: Yes  Lifestyle  . Physical activity:    Days per week: Not on file    Minutes per session: Not on file  . Stress: Not on file  Relationships  . Social connections:    Talks on phone: Not on file    Gets together: Not on file  Attends religious service: Not on file    Active member of club or organization: Not on file    Attends meetings of clubs or organizations: Not on file    Relationship status: Not on file  . Intimate partner violence:    Fear of current or ex partner: Not on file    Emotionally abused: Not on file    Physically abused: Not on file    Forced sexual activity: Not on file  Other Topics Concern  . Not on file  Social History Narrative  . Not on file    FAMILY HISTORY: Family History  Problem Relation Age of Onset  . Arthritis Unknown   . Cancer Unknown   . Diabetes Unknown   . Hypertension Mother   . Dementia Mother   . Diabetes Father   . ALS Father   . Diabetes Brother   . Hypertension Brother    . Cancer Paternal Aunt   . COPD Maternal Grandmother   . Cancer Maternal Grandfather   . Anesthesia problems Paternal Grandfather     ALLERGIES:  has No Known Allergies.  MEDICATIONS:  Current Outpatient Medications  Medication Sig Dispense Refill  . albuterol (PROVENTIL HFA;VENTOLIN HFA) 108 (90 Base) MCG/ACT inhaler Inhale 1-2 puffs into the lungs daily as needed. 1 Inhaler 5  . allopurinol (ZYLOPRIM) 300 MG tablet TAKE 1 TABLET BY MOUTH  DAILY FOR GOUT 90 tablet 1  . bumetanide (BUMEX) 1 MG tablet Take 1 tablet (1 mg total) by mouth daily. 90 tablet 1  . diazepam (VALIUM) 5 MG tablet Take one qhs prn anxiety or insomnia. 30 tablet 0  . fluticasone (FLONASE) 50 MCG/ACT nasal spray Place 2 sprays into the nose 2 (two) times daily.    Marland Kitchen gabapentin (NEURONTIN) 300 MG capsule     . HYDROcodone-acetaminophen (NORCO/VICODIN) 5-325 MG tablet Take 1 tablet by mouth every 6 (six) hours as needed for moderate pain. 20 tablet 0  . losartan (COZAAR) 100 MG tablet Take 1 tablet (100 mg total) by mouth daily. 90 tablet 1  . meloxicam (MOBIC) 15 MG tablet TAKE 1 TABLET BY MOUTH ONCE DAILY 30 tablet 2  . metFORMIN (GLUCOPHAGE) 500 MG tablet TAKE 1 TABLET BY MOUTH ONCE DAILY WITH BREAKFAST 90 tablet 1  . nitrofurantoin, macrocrystal-monohydrate, (MACROBID) 100 MG capsule Take 100 mg by mouth daily.     Marland Kitchen omeprazole (PRILOSEC) 20 MG capsule TAKE 1 CAPSULE BY MOUTH  DAILY 90 capsule 1  . polyethylene glycol powder (GLYCOLAX/MIRALAX) powder Take 1 Container by mouth daily.    Marland Kitchen tiZANidine (ZANAFLEX) 4 MG capsule Take 4 mg by mouth every 8 (eight) hours.    . valACYclovir (VALTREX) 1000 MG tablet Take 2 tablets now and 2 tablets 12 hours later 4 tablet 11  . verapamil (CALAN-SR) 240 MG CR tablet TAKE 1 TABLET BY MOUTH AT  BEDTIME 90 tablet 1   No current facility-administered medications for this visit.     REVIEW OF SYSTEMS:   Constitutional: Denies fevers, chills or abnormal night sweats Eyes:  Denies blurriness of vision, double vision or watery eyes Ears, nose, mouth, throat, and face: Denies mucositis or sore throat Respiratory: Denies cough, dyspnea or wheezes Cardiovascular: Denies palpitation, chest discomfort or lower extremity swelling Gastrointestinal:  Denies nausea, heartburn or change in bowel habits Skin: Denies abnormal skin rashes Lymphatics: Denies new lymphadenopathy or easy bruising Neurological:Denies numbness, tingling or new weaknesses.  Pain in the lower back since March. Behavioral/Psych: Mood is stable, no new  changes  All other systems were reviewed with the patient and are negative.  PHYSICAL EXAMINATION: ECOG PERFORMANCE STATUS: 1 - Symptomatic but completely ambulatory  Vitals:   10/29/17 1236  BP: (!) 184/117  Pulse: (!) 107  Resp: 18  Temp: 98.2 F (36.8 C)  SpO2: 99%   Filed Weights   10/29/17 1236  Weight: 271 lb 9.6 oz (123.2 kg)    GENERAL:alert, no distress and comfortable SKIN: skin color, texture, turgor are normal, no rashes or significant lesions EYES: normal, conjunctiva are pink and non-injected, sclera clear OROPHARYNX:no exudate, no erythema and lips, buccal mucosa, and tongue normal  NECK: supple, thyroid normal size, non-tender, without nodularity LYMPH:  no palpable lymphadenopathy in the cervical, axillary or inguinal LUNGS: clear to auscultation and percussion with normal breathing effort HEART: regular rate & rhythm and no murmurs and no lower extremity edema ABDOMEN:abdomen soft, non-tender and normal bowel sounds Musculoskeletal:no cyanosis of digits and no clubbing  PSYCH: alert & oriented x 3 with fluent speech NEURO: no focal motor/sensory deficits  LABORATORY DATA:  I have reviewed the data as listed Lab Results  Component Value Date   WBC 4.9 10/25/2017   HGB 8.7 (L) 10/25/2017   HCT 26.7 (L) 10/25/2017   MCV 94.3 10/25/2017   PLT 213 10/25/2017     Chemistry      Component Value Date/Time   NA 138  10/25/2017 1004   NA 143 08/02/2015 1113   K 3.9 10/25/2017 1004   CL 102 10/25/2017 1004   CO2 23 10/25/2017 1004   BUN 15 10/25/2017 1004   BUN 12 08/02/2015 1113   CREATININE 0.89 10/25/2017 1004   CREATININE 0.69 12/08/2014 0934      Component Value Date/Time   CALCIUM 10.6 (H) 10/25/2017 1004   ALKPHOS 88 10/25/2017 1004   AST 59 (H) 10/25/2017 1004   ALT 56 (H) 10/25/2017 1004   BILITOT 0.7 10/25/2017 1004   BILITOT 0.3 08/02/2015 1113       RADIOGRAPHIC STUDIES: I have personally reviewed the radiological images as listed and agreed with the findings in the report. Dg Abd 1 View  Result Date: 10/10/2017 CLINICAL DATA:  Left renal calculus EXAM: ABDOMEN - 1 VIEW COMPARISON:  09/27/2017 FINDINGS: 6 x 12 mm calculus proximal left ureter unchanged in position. Small right renal calculi. One of the calculi may be in the renal pelvis. No bladder calculi Normal bowel gas pattern.  No acute skeletal abnormality. IMPRESSION: Bilateral renal calculi unchanged from the prior study. Electronically Signed   By: Franchot Gallo M.D.   On: 10/10/2017 09:17   Ct Abdomen Pelvis W Contrast  Result Date: 10/25/2017 CLINICAL DATA:  Right lower quadrant abdominal pain for the past week. EXAM: CT ABDOMEN AND PELVIS WITH CONTRAST TECHNIQUE: Multidetector CT imaging of the abdomen and pelvis was performed using the standard protocol following bolus administration of intravenous contrast. CONTRAST:  175m ISOVUE-300 IOPAMIDOL (ISOVUE-300) INJECTION 61% COMPARISON:  Right upper quadrant ultrasound from yesterday. CT abdomen pelvis dated January 02, 2017. FINDINGS: Lower chest: No acute abnormality.  Bibasilar atelectasis/scarring. Hepatobiliary: Diffuse hepatic steatosis. No focal liver abnormality. The gallbladder is unremarkable. No biliary dilatation. Pancreas: Unremarkable. No pancreatic ductal dilatation or surrounding inflammatory changes. Spleen: Normal in size without focal abnormality. Adrenals/Urinary  Tract: The adrenal glands are unremarkable. 13 mm calculus in the proximal left ureter with resultant mild left hydronephrosis. Additional nonobstructive left-sided calculi measuring up to 11 mm. Small nonobstructive right renal calculi measuring up to 6  mm. No right hydronephrosis. The bladder is unremarkable. Stomach/Bowel: Stomach is within normal limits. Appendix appears normal. No evidence of bowel wall thickening, distention, or inflammatory changes. Vascular/Lymphatic: Aortic atherosclerosis. No enlarged abdominal or pelvic lymph nodes. Reproductive: The uterus and right ovary are surgically absent. Normal appearance of the left ovary. Other: Unchanged small fat containing umbilical hernia. No free fluid or pneumoperitoneum. Musculoskeletal: New innumerable small lytic lesions throughout the visualized thoracolumbar spine, ribs, and pelvis. New mild superior endplate compression fracture of T12. IMPRESSION: 1. 13 mm calculus in the proximal left ureter with resultant mild left hydronephrosis. 2. New innumerable small lytic lesions throughout the visualized axial skeleton, concerning for disseminated multiple myeloma. 3. New mild superior endplate compression fracture of T12. 4. Diffuse hepatic steatosis. 5.  Aortic atherosclerosis (ICD10-I70.0). Electronically Signed   By: Titus Dubin M.D.   On: 10/25/2017 11:35   US Abdomen Limited Ruq  Result Date: 10/24/2017 CLINICAL DATA:  Right upper quadrant pain and tenderness EXAM: ULTRASOUND ABDOMEN LIMITED RIGHT UPPER QUADRANT COMPARISON:  None. FINDINGS: Gallbladder: No gallstones or wall thickening visualized. No sonographic Murphy sign noted by sonographer. Common bile duct: Diameter: 5.3 mm Liver: No focal lesion identified. Small area of hypoechogenicity adjacent to the gallbladder fossa likely reflecting focal fatty sparing. Increased hepatic parenchymal echogenicity. Portal vein is patent on color Doppler imaging with normal direction of blood flow  towards the liver. IMPRESSION: 1. No cholelithiasis or sonographic evidence of acute cholecystitis. 2. Hepatic steatosis. Electronically Signed   By: Kathreen Devoid   On: 10/24/2017 16:48    ASSESSMENT & PLAN:  Multiple myeloma ([place]) 1.  Presumed multiple myeloma: - Presentation with lower back pain since March, presented to the emergency room on 10/25/2017 with right groin pain, CT scan showed multiple lytic lesions - SPEP shows 1.5 g/dL of monoclonal protein - Will obtain serum immunofixation and free light chain assay, 24-hour urine for total protein and immunofixation, metastatic skeletal survey, LDH and beta-2 microglobulin -We will repeat CMP today as her calcium was elevated at 10.6 in the ER a few days ago, will likely need IV fluids and zoledronic acid -We will set up bone marrow aspiration biopsy as soon as possible, for studies including flow cytometry, chromosome analysis and FISH panel -Discussed with patient and her husband in detail.  I have personally reviewed the findings on the CT scan with the patient.  Orders Placed This Encounter  Procedures  . DG Bone Survey Met    Order Specific Question:   Reason for exam:    Answer:   myeloma    Order Specific Question:   Preferred imaging location?    Answer:   [facility]  . 24 Hr Urn UIFE/Light Chains/TP QN    Standing Status:   Future    Number of Occurrences:   1    Standing Expiration Date:   10/30/2018  . Kappa/lambda light chains    Standing Status:   Future    Number of Occurrences:   1    Standing Expiration Date:   10/29/2018  . Immunofixation electrophoresis    Standing Status:   Future    Number of Occurrences:   1    Standing Expiration Date:   10/29/2018  . Beta 2 microglobuline, serum    Standing Status:   Future    Number of Occurrences:   1    Standing Expiration Date:   10/29/2018  . Comprehensive metabolic panel    Standing Status:   Future  Number of Occurrences:   1    Standing Expiration Date:    10/29/2018  . CBC    Standing Status:   Future    Number of Occurrences:   1    Standing Expiration Date:   10/29/2018  . Lactate dehydrogenase    Standing Status:   Future    Number of Occurrences:   1    Standing Expiration Date:   10/29/2018    All questions were answered. The patient knows to call the clinic with any problems, questions or concerns. I spent 25 minutes counseling the patient face to face. The total time spent in the appointment was 45 minutes and more than 50% was on counseling.     Derek Jack, MD 10/29/2017 1:20 PM

## 2017-10-29 NOTE — Progress Notes (Signed)
1430 Labs reviewed with and pt seen by Dr. Raliegh Ip and pt will received Zometa infusion and hydration today for Calcium of 10.8. Reviewed purpose and side effects of Zometa with pt who verbalized understanding. Written information also given to pt and permit signed.  Joanna Reid tolerated Zometa infusion and hydration well without complaints or incident. VSS upon discharge. Pt discharged via wheelchair in satisfactory condition accompanied by his husband

## 2017-10-29 NOTE — Patient Instructions (Signed)
[place] at [facility] Discharge Instructions  Received Zometa infusion and hydration today. Follow-up as scheduled. Call clinic for any questions or concerns   Thank you for choosing [place] at [facility] to provide your oncology and hematology care.  To afford each patient quality time with our provider, please arrive at least 15 minutes before your scheduled appointment time.   If you have a lab appointment with the [facility] please come in thru the  Main Entrance and check in at the main information desk  You need to re-schedule your appointment should you arrive 10 or more minutes late.  We strive to give you quality time with our providers, and arriving late affects you and other patients whose appointments are after yours.  Also, if you no show three or more times for appointments you may be dismissed from the clinic at the providers discretion.     Again, thank you for choosing [facility].  Our hope is that these requests will decrease the amount of time that you wait before being seen by our physicians.       _____________________________________________________________  Should you have questions after your visit to [facility], please contact our office at [phone] between the hours of 8:30 a.m. and 4:30 p.m.  Voicemails left after 4:30 p.m. will not be returned until the following business day.  For prescription refill requests, have your pharmacy contact our office.       Resources For Cancer Patients and their Caregivers ? American Cancer Society: Can assist with transportation, wigs, general needs, runs Look Good Feel Better.        217 850 2673 ? Cancer Care: Provides financial assistance, online support groups, medication/co-pay assistance.  1-800-813-HOPE 606-879-4274) ? [facility] Assists [place] cancer patients and their families through emotional ,  educational and financial support.  787-885-7470 ? [place] DSS Where to apply for food stamps, Medicaid and utility assistance. 857-330-9653 ? [employer]: Transportation to medical appointments. 802-799-9731 ? Social Security Administration: May apply for disability if have a Stage IV cancer. (949)153-1961 902-161-0190 ? [employer], Disability and Transit Services: Assists with nutrition, care and transit needs. [place] Support Programs:   > Cancer Support Group  2nd Tuesday of the month 1pm-2pm, Journey Room   > Creative Journey  3rd Tuesday of the month 1130am-1pm, Journey Room

## 2017-10-30 ENCOUNTER — Inpatient Hospital Stay (HOSPITAL_COMMUNITY): Payer: BLUE CROSS/BLUE SHIELD

## 2017-10-30 ENCOUNTER — Encounter (HOSPITAL_COMMUNITY): Payer: Self-pay | Admitting: Hematology

## 2017-10-30 ENCOUNTER — Inpatient Hospital Stay (HOSPITAL_BASED_OUTPATIENT_CLINIC_OR_DEPARTMENT_OTHER): Payer: BLUE CROSS/BLUE SHIELD | Admitting: Hematology

## 2017-10-30 VITALS — BP 173/88 | HR 92 | Temp 98.0°F | Resp 20

## 2017-10-30 DIAGNOSIS — I10 Essential (primary) hypertension: Secondary | ICD-10-CM | POA: Diagnosis not present

## 2017-10-30 DIAGNOSIS — C9000 Multiple myeloma not having achieved remission: Secondary | ICD-10-CM | POA: Diagnosis not present

## 2017-10-30 DIAGNOSIS — K5900 Constipation, unspecified: Secondary | ICD-10-CM | POA: Diagnosis not present

## 2017-10-30 DIAGNOSIS — M899 Disorder of bone, unspecified: Secondary | ICD-10-CM | POA: Diagnosis not present

## 2017-10-30 DIAGNOSIS — D649 Anemia, unspecified: Secondary | ICD-10-CM | POA: Diagnosis not present

## 2017-10-30 DIAGNOSIS — D4989 Neoplasm of unspecified behavior of other specified sites: Secondary | ICD-10-CM | POA: Diagnosis not present

## 2017-10-30 DIAGNOSIS — E119 Type 2 diabetes mellitus without complications: Secondary | ICD-10-CM | POA: Diagnosis not present

## 2017-10-30 DIAGNOSIS — M545 Low back pain: Secondary | ICD-10-CM | POA: Diagnosis not present

## 2017-10-30 LAB — CBC WITH DIFFERENTIAL/PLATELET
BASOS ABS: 0 10*3/uL (ref 0.0–0.1)
Basophils Relative: 0 %
EOS PCT: 1 %
Eosinophils Absolute: 0.1 10*3/uL (ref 0.0–0.7)
HEMATOCRIT: 28.2 % — AB (ref 36.0–46.0)
Hemoglobin: 9 g/dL — ABNORMAL LOW (ref 12.0–15.0)
LYMPHS ABS: 1.1 10*3/uL (ref 0.7–4.0)
LYMPHS PCT: 22 %
MCH: 30.2 pg (ref 26.0–34.0)
MCHC: 31.9 g/dL (ref 30.0–36.0)
MCV: 94.6 fL (ref 78.0–100.0)
MONO ABS: 0.4 10*3/uL (ref 0.1–1.0)
Monocytes Relative: 8 %
NEUTROS ABS: 3.4 10*3/uL (ref 1.7–7.7)
Neutrophils Relative %: 69 %
Platelets: 255 10*3/uL (ref 150–400)
RBC: 2.98 MIL/uL — ABNORMAL LOW (ref 3.87–5.11)
RDW: 16 % — AB (ref 11.5–15.5)
WBC: 4.9 10*3/uL (ref 4.0–10.5)

## 2017-10-30 LAB — KAPPA/LAMBDA LIGHT CHAINS
KAPPA FREE LGHT CHN: 12.9 mg/L (ref 3.3–19.4)
KAPPA, LAMDA LIGHT CHAIN RATIO: 0 — AB (ref 0.26–1.65)
LAMDA FREE LIGHT CHAINS: 2869.4 mg/L — AB (ref 5.7–26.3)

## 2017-10-30 LAB — BETA 2 MICROGLOBULIN, SERUM: Beta-2 Microglobulin: 4.8 mg/L — ABNORMAL HIGH (ref 0.6–2.4)

## 2017-10-30 MED ORDER — LIDOCAINE HCL (PF) 1 % IJ SOLN
INTRAMUSCULAR | Status: AC
  Filled 2017-10-30: qty 5

## 2017-10-30 NOTE — Assessment & Plan Note (Signed)
1.  Presumed multiple myeloma: - Presentation with lower back pain since March, presented to the emergency room on 10/25/2017 with right groin pain, CT scan showed multiple lytic lesions - SPEP shows 1.5 g/dL of monoclonal protein -Serum immunofixation, 24-hour urine, studies, LDH, beta-2 microglobulin were ordered. -I have reviewed skeletal survey which showed multiple lytic lesions. -We have discussed the need for bone marrow aspiration and biopsy.  We talked about the rare side effects of bleeding or infection.  We will proceed with bone marrow aspiration. -She received zoledronic acid and fluids yesterday.  She felt better.  We will reevaluate her in 1 week.

## 2017-10-30 NOTE — Progress Notes (Signed)
Patient presented today for a bone marrow biopsy per MD. Vitals obtained pre and post procedure. Consent signed. Time out 0826. Started procedure at 0831, stopped at 0858. Dressing applied to the left lower back/upper buttocks area by MD. Bandage clean, dry, intact, no bleeding or swelling noted. Discharge instructions given regarding after care of biopsy. Instructed patient to call for any concerns or questions. Patient discharged 30 min after procedure done per Dr. Delton Coombes . Patient tolerated it well without problems. Vitals stable and discharged home from clinic via wheelchair.  Follow up as scheduled.

## 2017-10-30 NOTE — Patient Instructions (Addendum)
[place] at [facility] Discharge Instructions  You had a bone marrow biopsy today.  He will call you with results. Instructions given to patient for after care of bone marrow biopsy.   Thank you for choosing [place] at [facility] to provide your oncology and hematology care.  To afford each patient quality time with our provider, please arrive at least 15 minutes before your scheduled appointment time.   If you have a lab appointment with the [facility] please come in thru the  Main Entrance and check in at the main information desk  You need to re-schedule your appointment should you arrive 10 or more minutes late.  We strive to give you quality time with our providers, and arriving late affects you and other patients whose appointments are after yours.  Also, if you no show three or more times for appointments you may be dismissed from the clinic at the providers discretion.     Again, thank you for choosing [facility].  Our hope is that these requests will decrease the amount of time that you wait before being seen by our physicians.       _____________________________________________________________  Should you have questions after your visit to [facility], please contact our office at [phone] between the hours of 8:30 a.m. and 4:30 p.m.  Voicemails left after 4:30 p.m. will not be returned until the following business day.  For prescription refill requests, have your pharmacy contact our office.       Resources For Cancer Patients and their Caregivers ? American Cancer Society: Can assist with transportation, wigs, general needs, runs Look Good Feel Better.        (712)078-3038 ? Cancer Care: Provides financial assistance, online support groups, medication/co-pay assistance.  1-800-813-HOPE 346-428-5901) ? [facility] Assists [place] cancer patients and their families  through emotional , educational and financial support.  873 119 2611 ? [place] DSS Where to apply for food stamps, Medicaid and utility assistance. 319-216-5852 ? [employer]: Transportation to medical appointments. 782 588 3379 ? Social Security Administration: May apply for disability if have a Stage IV cancer. 262-121-7274 330 121 1632 ? [employer], Disability and Transit Services: Assists with nutrition, care and transit needs. [place] Support Programs:   > Cancer Support Group  2nd Tuesday of the month 1pm-2pm, Journey Room   > Creative Journey  3rd Tuesday of the month 1130am-1pm, Journey Room

## 2017-10-30 NOTE — Progress Notes (Signed)
INDICATION: Multiple myeloma.    Bone Marrow Biopsy and Aspiration Procedure Note   The patient was identified by name and date of birth, prior to start of the procedure and a timeout was performed.   An informed consent was obtained after discussing potential risks including bleeding, infection and pain.  The left posterior iliac crest was palpated, cleaned with ChloraPrep, and drapes applied.  1% lidocaine is infiltrated into the skin, subcutaneous tissue and periosteum.  Bone marrow was difficult to aspirate.  Only 2-3 cc was obtained.  Bone marrow was aspirated and smears made.  With the help of Jamshidi needle a core biopsy was obtained.  Pressure was applied to the biopsy site and bandage was placed over the biopsy site. Patient was made to lie on the back for 15 mins prior to discharge.  The procedure was tolerated well. COMPLICATIONS: None BLOOD LOSS: none Patient was discharged home in stable condition to return in 2 weeks to review results.  Patient was provided with post bone marrow biopsy instructions and instructed to call if there was any bleeding or worsening pain.  Specimens sent for flow cytometry, cytogenetics and additional studies.  Signed Derek Jack, MD

## 2017-10-30 NOTE — Progress Notes (Signed)
Joanna Reid, [place] [address]   CLINIC:  Medical Oncology/Hematology  PCP:  Mikey Kirschner, [place] [address] [phone]   REASON FOR VISIT:  Follow-up for possible multiple myeloma.  CURRENT THERAPY: Zoledronic acid and IV fluids.   INTERVAL HISTORY:  Joanna Reid 52 y.o. female returns for bone marrow aspiration and biopsy today.  She received fluids and zoledronic acid yesterday and felt much better.  She continues to have diffuse body pains, predominantly in the lower back.  She denies any fevers or night sweats.   REVIEW OF SYSTEMS:  Review of Systems  Constitutional: Positive for fatigue.  Musculoskeletal: Positive for back pain.  All other systems reviewed and are negative.    PAST MEDICAL/SURGICAL HISTORY:  Past Medical History:  Diagnosis Date  . Acid reflux   . Allergic rhinitis   . Diabetes mellitus    type 2  . Gout   . Gout   . HBP (high blood pressure)   . History of kidney stones   . Migraines    Past Surgical History:  Procedure Laterality Date  . CESAREAN SECTION    . EXTRACORPOREAL SHOCK WAVE LITHOTRIPSY Left 10/10/2017   Procedure: LEFT EXTRACORPOREAL SHOCK WAVE LITHOTRIPSY (ESWL);  Surgeon: Bjorn Loser, MD;  Location: [facility];  Service: Urology;  Laterality: Left;  . EYE SURGERY    . HEMORRHOID SURGERY N/A 11/19/2012   Procedure: HEMORRHOIDECTOMY;  Surgeon: Jamesetta So, MD;  Location: [facility];  Service: General;  Laterality: N/A;  . kidney stones  1998  . LAPAROSCOPIC UNILATERAL SALPINGO OOPHERECTOMY  05/14/2012   Procedure: LAPAROSCOPIC UNILATERAL SALPINGO OOPHORECTOMY;  Surgeon: [name], MD;  Location: [facility];  Service: Gynecology;  Laterality: Right;  laparoscopic right salpingo-oophorectomy  . PARTIAL HYSTERECTOMY    . TONSILECTOMY, ADENOIDECTOMY, BILATERAL MYRINGOTOMY AND TUBES    . VESICOVAGINAL FISTULA CLOSURE W/ TAH       SOCIAL HISTORY:  Social  History   Socioeconomic History  . Marital status: Married    Spouse name: Not on file  . Number of children: Not on file  . Years of education: 58  . Highest education level: Not on file  Occupational History    Employer: [employer]  . Financial resource strain: Not on file  . Food insecurity:    Worry: Never true    Inability: Never true  . Transportation needs:    Medical: Not on file    Non-medical: Not on file  Tobacco Use  . Smoking status: Former Smoker    Last attempt to quit: 02/27/1999    Years since quitting: 18.6  . Smokeless tobacco: Never Used  . Tobacco comment: socially   Substance and Sexual Activity  . Alcohol use: No    Alcohol/week: 0.0 oz  . Drug use: No  . Sexual activity: Yes  Lifestyle  . Physical activity:    Days per week: Not on file    Minutes per session: Not on file  . Stress: Not on file  Relationships  . Social connections:    Talks on phone: Not on file    Gets together: Not on file    Attends religious service: Not on file    Active member of club or organization: Not on file    Attends meetings of clubs or organizations: Not on file    Relationship status: Not on file  . Intimate partner violence:  Fear of current or ex partner: Not on file    Emotionally abused: Not on file    Physically abused: Not on file    Forced sexual activity: Not on file  Other Topics Concern  . Not on file  Social History Narrative  . Not on file    FAMILY HISTORY:  Family History  Problem Relation Age of Onset  . Arthritis Unknown   . Cancer Unknown   . Diabetes Unknown   . Hypertension Mother   . Dementia Mother   . Diabetes Father   . ALS Father   . Diabetes Brother   . Hypertension Brother   . Cancer Paternal Aunt   . COPD Maternal Grandmother   . Cancer Maternal Grandfather   . Anesthesia problems Paternal Grandfather     CURRENT MEDICATIONS:  Outpatient Encounter Medications as of 10/30/2017  Medication Sig  .  albuterol (PROVENTIL HFA;VENTOLIN HFA) 108 (90 Base) MCG/ACT inhaler Inhale 1-2 puffs into the lungs daily as needed.  Marland Kitchen allopurinol (ZYLOPRIM) 300 MG tablet TAKE 1 TABLET BY MOUTH  DAILY FOR GOUT  . bumetanide (BUMEX) 1 MG tablet Take 1 tablet (1 mg total) by mouth daily.  . diazepam (VALIUM) 5 MG tablet Take one qhs prn anxiety or insomnia.  . fluticasone (FLONASE) 50 MCG/ACT nasal spray Place 2 sprays into the nose 2 (two) times daily.  Marland Kitchen gabapentin (NEURONTIN) 300 MG capsule   . HYDROcodone-acetaminophen (NORCO/VICODIN) 5-325 MG tablet Take 1 tablet by mouth every 6 (six) hours as needed for moderate pain.  Marland Kitchen losartan (COZAAR) 100 MG tablet Take 1 tablet (100 mg total) by mouth daily.  . meloxicam (MOBIC) 15 MG tablet TAKE 1 TABLET BY MOUTH ONCE DAILY  . metFORMIN (GLUCOPHAGE) 500 MG tablet TAKE 1 TABLET BY MOUTH ONCE DAILY WITH BREAKFAST  . nitrofurantoin, macrocrystal-monohydrate, (MACROBID) 100 MG capsule Take 100 mg by mouth daily.   Marland Kitchen omeprazole (PRILOSEC) 20 MG capsule TAKE 1 CAPSULE BY MOUTH  DAILY  . polyethylene glycol powder (GLYCOLAX/MIRALAX) powder Take 1 Container by mouth daily.  Marland Kitchen tiZANidine (ZANAFLEX) 4 MG capsule Take 4 mg by mouth every 8 (eight) hours.  . valACYclovir (VALTREX) 1000 MG tablet Take 2 tablets now and 2 tablets 12 hours later  . verapamil (CALAN-SR) 240 MG CR tablet TAKE 1 TABLET BY MOUTH AT  BEDTIME   No facility-administered encounter medications on file as of 10/30/2017.     ALLERGIES:  No Known Allergies   PHYSICAL EXAM:  ECOG Performance status: 1  I have reviewed her vitals. Physical Exam   LABORATORY DATA:  I have reviewed the labs as listed.  CBC    Component Value Date/Time   WBC 5.8 10/29/2017 1318   RBC 3.19 (L) 10/29/2017 1318   HGB 9.7 (L) 10/29/2017 1318   HGB 14.2 08/02/2015 1113   HCT 29.8 (L) 10/29/2017 1318   HCT 41.1 08/02/2015 1113   PLT 258 10/29/2017 1318   PLT 371 08/02/2015 1113   MCV 93.4 10/29/2017 1318   MCV  84 08/02/2015 1113   MCH 30.4 10/29/2017 1318   MCHC 32.6 10/29/2017 1318   RDW 16.0 (H) 10/29/2017 1318   RDW 15.1 08/02/2015 1113   LYMPHSABS 1.7 10/25/2017 1004   LYMPHSABS 2.9 08/02/2015 1113   MONOABS 0.4 10/25/2017 1004   EOSABS 0.1 10/25/2017 1004   EOSABS 0.2 08/02/2015 1113   BASOSABS 0.0 10/25/2017 1004   BASOSABS 0.1 08/02/2015 1113   CMP Latest Ref Rng & Units 10/29/2017 10/25/2017  10/24/2017  Glucose 65 - 99 mg/dL 146(H) 164(H) 174(H)  BUN 6 - 20 mg/dL _0 Creatinine 0.44 - 1.00 mg/dL 0.91 0.89 0.91  Sodium 135 - 145 mmol/L 138 138 139  Potassium 3.5 - 5.1 mmol/L 3.9 3.9 3.8  Chloride 101 - 111 mmol/L 102 102 102  CO2 22 - 32 mmol/L 20(L) 23 24  Calcium 8.9 - 10.3 mg/dL 10.8(H) 10.6(H) 10.3  Total Protein 6.5 - 8.1 g/dL 8.8(H) 8.4(H) 8.3(H)  Total Bilirubin 0.3 - 1.2 mg/dL 0.7 0.7 0.7  Alkaline Phos 38 - 126 U/L 93 88 92  AST 15 - 41 U/L 92(H) 59(H) 72(H)  ALT 14 - 54 U/L 71(H) 56(H) 63(H)       DIAGNOSTIC IMAGING:  I have personally reviewed her metastatic skeletal survey and agree with report.    ASSESSMENT & PLAN:   Multiple myeloma ([place]) 1.  Presumed multiple myeloma: - Presentation with lower back pain since March, presented to the emergency room on 10/25/2017 with right groin pain, CT scan showed multiple lytic lesions - SPEP shows 1.5 g/dL of monoclonal protein -Serum immunofixation, 24-hour urine, studies, LDH, beta-2 microglobulin were ordered. -I have reviewed skeletal survey which showed multiple lytic lesions. -We have discussed the need for bone marrow aspiration and biopsy.  We talked about the rare side effects of bleeding or infection.  We will proceed with bone marrow aspiration. -She received zoledronic acid and fluids yesterday.  She felt better.  We will reevaluate her in 1 week.      Orders placed this encounter:  No orders of the defined types were placed in this encounter.     Derek Jack, MD [place] [phone]

## 2017-10-31 LAB — IMMUNOFIXATION ELECTROPHORESIS
IGM (IMMUNOGLOBULIN M), SRM: 51 mg/dL (ref 26–217)
IgA: 80 mg/dL — ABNORMAL LOW (ref 87–352)
IgG (Immunoglobin G), Serum: 2616 mg/dL — ABNORMAL HIGH (ref 700–1600)
TOTAL PROTEIN ELP: 8.2 g/dL (ref 6.0–8.5)

## 2017-11-01 ENCOUNTER — Other Ambulatory Visit (HOSPITAL_COMMUNITY): Payer: Self-pay | Admitting: Hematology

## 2017-11-01 ENCOUNTER — Encounter (HOSPITAL_COMMUNITY): Payer: Self-pay

## 2017-11-01 ENCOUNTER — Telehealth (HOSPITAL_COMMUNITY): Payer: Self-pay | Admitting: Hematology

## 2017-11-01 ENCOUNTER — Inpatient Hospital Stay (HOSPITAL_COMMUNITY): Payer: BLUE CROSS/BLUE SHIELD | Attending: Hematology

## 2017-11-01 ENCOUNTER — Encounter (HOSPITAL_COMMUNITY): Payer: Self-pay | Admitting: Hematology

## 2017-11-01 ENCOUNTER — Inpatient Hospital Stay (HOSPITAL_COMMUNITY): Payer: BLUE CROSS/BLUE SHIELD

## 2017-11-01 ENCOUNTER — Inpatient Hospital Stay (HOSPITAL_BASED_OUTPATIENT_CLINIC_OR_DEPARTMENT_OTHER): Payer: BLUE CROSS/BLUE SHIELD | Admitting: Hematology

## 2017-11-01 VITALS — BP 183/103 | HR 101 | Temp 98.0°F | Resp 20 | Wt 265.0 lb

## 2017-11-01 DIAGNOSIS — C9000 Multiple myeloma not having achieved remission: Secondary | ICD-10-CM

## 2017-11-01 DIAGNOSIS — Z5112 Encounter for antineoplastic immunotherapy: Secondary | ICD-10-CM | POA: Diagnosis not present

## 2017-11-01 DIAGNOSIS — Z7689 Persons encountering health services in other specified circumstances: Secondary | ICD-10-CM | POA: Insufficient documentation

## 2017-11-01 DIAGNOSIS — M545 Low back pain: Secondary | ICD-10-CM | POA: Diagnosis not present

## 2017-11-01 DIAGNOSIS — M899 Disorder of bone, unspecified: Secondary | ICD-10-CM | POA: Diagnosis not present

## 2017-11-01 DIAGNOSIS — Z7189 Other specified counseling: Secondary | ICD-10-CM

## 2017-11-01 DIAGNOSIS — K5900 Constipation, unspecified: Secondary | ICD-10-CM | POA: Diagnosis not present

## 2017-11-01 DIAGNOSIS — I10 Essential (primary) hypertension: Secondary | ICD-10-CM | POA: Diagnosis not present

## 2017-11-01 DIAGNOSIS — Z5111 Encounter for antineoplastic chemotherapy: Secondary | ICD-10-CM | POA: Diagnosis not present

## 2017-11-01 DIAGNOSIS — E119 Type 2 diabetes mellitus without complications: Secondary | ICD-10-CM | POA: Diagnosis not present

## 2017-11-01 LAB — CBC WITH DIFFERENTIAL/PLATELET
Basophils Absolute: 0 10*3/uL (ref 0.0–0.1)
Basophils Relative: 0 %
EOS PCT: 1 %
Eosinophils Absolute: 0.1 10*3/uL (ref 0.0–0.7)
HCT: 28.6 % — ABNORMAL LOW (ref 36.0–46.0)
Hemoglobin: 9.2 g/dL — ABNORMAL LOW (ref 12.0–15.0)
Lymphocytes Relative: 29 %
Lymphs Abs: 1.5 10*3/uL (ref 0.7–4.0)
MCH: 30.3 pg (ref 26.0–34.0)
MCHC: 32.2 g/dL (ref 30.0–36.0)
MCV: 94.1 fL (ref 78.0–100.0)
Monocytes Absolute: 0.6 10*3/uL (ref 0.1–1.0)
Monocytes Relative: 12 %
Neutro Abs: 3 10*3/uL (ref 1.7–7.7)
Neutrophils Relative %: 58 %
PLATELETS: 256 10*3/uL (ref 150–400)
RBC: 3.04 MIL/uL — AB (ref 3.87–5.11)
RDW: 16 % — AB (ref 11.5–15.5)
WBC: 5.1 10*3/uL (ref 4.0–10.5)

## 2017-11-01 LAB — COMPREHENSIVE METABOLIC PANEL
ALK PHOS: 88 U/L (ref 38–126)
ALT: 60 U/L — AB (ref 14–54)
AST: 67 U/L — AB (ref 15–41)
Albumin: 3.9 g/dL (ref 3.5–5.0)
Anion gap: 14 (ref 5–15)
BILIRUBIN TOTAL: 0.8 mg/dL (ref 0.3–1.2)
BUN: 21 mg/dL — AB (ref 6–20)
CO2: 22 mmol/L (ref 22–32)
Calcium: 8.6 mg/dL — ABNORMAL LOW (ref 8.9–10.3)
Chloride: 101 mmol/L (ref 101–111)
Creatinine, Ser: 1.03 mg/dL — ABNORMAL HIGH (ref 0.44–1.00)
GFR calc Af Amer: >60 mL/min (ref >60)
Glucose, Bld: 175 mg/dL — ABNORMAL HIGH (ref 65–99)
Potassium: 3.3 mmol/L — ABNORMAL LOW (ref 3.5–5.1)
Sodium: 137 mmol/L (ref 135–145)
TOTAL PROTEIN: 8.9 g/dL — AB (ref 6.5–8.1)

## 2017-11-01 LAB — IRON AND TIBC
IRON: 78 ug/dL (ref 28–170)
SATURATION RATIOS: 24 % (ref 10.4–31.8)
TIBC: 332 ug/dL (ref 250–450)
UIBC: 254 ug/dL

## 2017-11-01 LAB — FERRITIN: Ferritin: 651 ng/mL — ABNORMAL HIGH (ref 11–307)

## 2017-11-01 MED ORDER — LACTULOSE 20 GM/30ML PO SOLN: ORAL | 1 refills | Status: DC

## 2017-11-01 MED ORDER — ONDANSETRON HCL 4 MG PO TABS
8.0000 mg | ORAL_TABLET | Freq: Once | ORAL | Status: AC
  Administered 2017-11-01: 8 mg via ORAL

## 2017-11-01 MED ORDER — LENALIDOMIDE 25 MG PO CAPS: ORAL_CAPSULE | ORAL | 3 refills | Status: DC

## 2017-11-01 MED ORDER — ACYCLOVIR 400 MG PO TABS: 400.0000 mg | ORAL_TABLET | Freq: Two times a day (BID) | ORAL | 3 refills | Status: DC

## 2017-11-01 MED ORDER — PROCHLORPERAZINE MALEATE 10 MG PO TABS
10.0000 mg | ORAL_TABLET | Freq: Once | ORAL | Status: AC
  Administered 2017-11-01: 10 mg via ORAL
  Filled 2017-11-01: qty 1

## 2017-11-01 MED ORDER — PROCHLORPERAZINE MALEATE 10 MG PO TABS: 10.0000 mg | ORAL_TABLET | Freq: Four times a day (QID) | ORAL | 2 refills | Status: DC | PRN

## 2017-11-01 MED ORDER — DEXAMETHASONE 4 MG PO TABS: 40.0000 mg | ORAL_TABLET | ORAL | 0 refills | Status: DC

## 2017-11-01 MED ORDER — ONDANSETRON HCL 8 MG PO TABS: 8.0000 mg | ORAL_TABLET | Freq: Three times a day (TID) | ORAL | 3 refills | Status: DC | PRN

## 2017-11-01 MED ORDER — SODIUM CHLORIDE 0.9 % IV SOLN
INTRAVENOUS | Status: DC
  Administered 2017-11-01: 16:00:00 via INTRAVENOUS

## 2017-11-01 MED ORDER — SODIUM CHLORIDE 0.9 % IV SOLN
500.0000 mg/m2 | Freq: Once | INTRAVENOUS | Status: DC
  Filled 2017-11-01: qty 59

## 2017-11-01 MED ORDER — SODIUM CHLORIDE 0.9 % IV SOLN
20.0000 mg | Freq: Once | INTRAVENOUS | Status: AC
  Administered 2017-11-01: 20 mg via INTRAVENOUS
  Filled 2017-11-01: qty 2

## 2017-11-01 MED ORDER — ONDANSETRON HCL 4 MG PO TABS
ORAL_TABLET | ORAL | Status: AC
  Filled 2017-11-01: qty 2

## 2017-11-01 MED ORDER — SODIUM CHLORIDE 0.9 % IV SOLN
INTRAVENOUS | Status: AC
  Administered 2017-11-01: 500 mL via INTRAVENOUS

## 2017-11-01 MED ORDER — BORTEZOMIB CHEMO SQ INJECTION 3.5 MG (2.5MG/ML)
1.3000 mg/m2 | Freq: Once | INTRAMUSCULAR | Status: AC
  Administered 2017-11-01: 3 mg via SUBCUTANEOUS
  Filled 2017-11-01: qty 3

## 2017-11-01 MED ORDER — SODIUM CHLORIDE 0.9 % IV SOLN
500.0000 mg/m2 | Freq: Once | INTRAVENOUS | Status: AC
  Administered 2017-11-01: 1180 mg via INTRAVENOUS
  Filled 2017-11-01: qty 50

## 2017-11-01 NOTE — Progress Notes (Signed)
START ON PATHWAY REGIMEN - Multiple Myeloma and Other Plasma Cell Dyscrasias     A cycle is every 21 days:     Bortezomib      Lenalidomide      Dexamethasone   **Always confirm dose/schedule in your pharmacy ordering system**  Patient Characteristics: Newly Diagnosed, Transplant Eligible, Unknown or Awaiting Test Results R-ISS Staging: II Disease Classification: Newly Diagnosed Is Patient Eligible for Transplant<= Transplant Eligible Risk Status: Awaiting Test Results Intent of Therapy: Non-Curative / Palliative Intent, Discussed with Patient 

## 2017-11-01 NOTE — Patient Instructions (Addendum)
[facility] at [facility] Discharge Instructions  Today you saw Dr. K.   Thank you for choosing [facility] at [facility] to provide your oncology and hematology care.  To afford each patient quality time with our provider, please arrive at least 15 minutes before your scheduled appointment time.   If you have a lab appointment with the Cancer Center please come in thru the  Main Entrance and check in at the main information desk  You need to re-schedule your appointment should you arrive 10 or more minutes late.  We strive to give you quality time with our providers, and arriving late affects you and other patients whose appointments are after yours.  Also, if you no show three or more times for appointments you may be dismissed from the clinic at the providers discretion.     Again, thank you for choosing [facility].  Our hope is that these requests will decrease the amount of time that you wait before being seen by our physicians.       _____________________________________________________________  Should you have questions after your visit to [facility], please contact our office at [phone] between the hours of 8:30 a.m. and 4:30 p.m.  Voicemails left after 4:30 p.m. will not be returned until the following business day.  For prescription refill requests, have your pharmacy contact our office.       Resources For Cancer Patients and their Caregivers ? American Cancer Society: Can assist with transportation, wigs, general needs, runs Look Good Feel Better.        1-888-227-6333 ? Cancer Care: Provides financial assistance, online support groups, medication/co-pay assistance.  1-800-813-HOPE (4673) ? [facility] Assists [place] cancer patients and their families through emotional , educational and financial support.  336-427-4357 ? [place] DSS Where to apply for food  stamps, Medicaid and utility assistance. 336-342-1394 ? [facility]: Transportation to medical appointments. 336-347-2287 ? Social Security Administration: May apply for disability if have a Stage IV cancer. 336-342-7796 1-800-772-1213 ? [place] Aging, Disability and Transit Services: Assists with nutrition, care and transit needs. 336-349-2343  Cancer Center Support Programs:   > Cancer Support Group  2nd Tuesday of the month 1pm-2pm, Journey Room   > Creative Journey  3rd Tuesday of the month 1130am-1pm, Journey Room    

## 2017-11-01 NOTE — Progress Notes (Signed)
Joanna Reid, [place] [address]   CLINIC:  Medical Oncology/Hematology  PCP:  Mikey Kirschner, [place] [address] [phone]   REASON FOR VISIT:  Follow-up for multiple myeloma.  CURRENT THERAPY: Cyclophosphamide, bortezomib, dexamethasone.  BRIEF ONCOLOGIC HISTORY:    Multiple myeloma ([place])   10/29/2017 Initial Diagnosis    Multiple myeloma ([place])      11/01/2017 -  Chemotherapy    The patient had bortezomib SQ (VELCADE) chemo injection 3 mg, 1.3 mg/m2 = 3 mg, Subcutaneous,  Once, 1 of 4 cycles Administration: 3 mg (11/01/2017)  for chemotherapy treatment.         INTERVAL HISTORY:  Joanna Reid 52 y.o. female returns for follow-up of bone marrow biopsy.  She complains of pains in her lower back as well as right-sided inframammary ribs.  She is also feeling very weak.  She is taking hydrocodone every 4 hours.  Pain is moderately controlled.  She also reports having constipation.  Denies any fevers or infections.   REVIEW OF SYSTEMS:  Review of Systems  Gastrointestinal: Positive for constipation.  Musculoskeletal: Positive for back pain.  All other systems reviewed and are negative.    PAST MEDICAL/SURGICAL HISTORY:  Past Medical History:  Diagnosis Date  . Acid reflux   . Allergic rhinitis   . Diabetes mellitus    type 2  . Gout   . Gout   . HBP (high blood pressure)   . History of kidney stones   . Migraines    Past Surgical History:  Procedure Laterality Date  . CESAREAN SECTION    . EXTRACORPOREAL SHOCK WAVE LITHOTRIPSY Left 10/10/2017   Procedure: LEFT EXTRACORPOREAL SHOCK WAVE LITHOTRIPSY (ESWL);  Surgeon: Bjorn Loser, MD;  Location: [facility];  Service: Urology;  Laterality: Left;  . EYE SURGERY    . HEMORRHOID SURGERY N/A 11/19/2012   Procedure: HEMORRHOIDECTOMY;  Surgeon: Jamesetta So, MD;  Location: [facility];  Service: General;  Laterality: N/A;  . kidney stones  1998  .  LAPAROSCOPIC UNILATERAL SALPINGO OOPHERECTOMY  05/14/2012   Procedure: LAPAROSCOPIC UNILATERAL SALPINGO OOPHORECTOMY;  Surgeon: [name], MD;  Location: [facility];  Service: Gynecology;  Laterality: Right;  laparoscopic right salpingo-oophorectomy  . PARTIAL HYSTERECTOMY    . TONSILECTOMY, ADENOIDECTOMY, BILATERAL MYRINGOTOMY AND TUBES    . VESICOVAGINAL FISTULA CLOSURE W/ TAH       SOCIAL HISTORY:  Social History   Socioeconomic History  . Marital status: Married    Spouse name: Not on file  . Number of children: Not on file  . Years of education: 11  . Highest education level: Not on file  Occupational History    Employer: [employer]  . Financial resource strain: Not on file  . Food insecurity:    Worry: Never true    Inability: Never true  . Transportation needs:    Medical: Not on file    Non-medical: Not on file  Tobacco Use  . Smoking status: Former Smoker    Last attempt to quit: 02/27/1999    Years since quitting: 18.6  . Smokeless tobacco: Never Used  . Tobacco comment: socially   Substance and Sexual Activity  . Alcohol use: No    Alcohol/week: 0.0 oz  . Drug use: No  . Sexual activity: Yes  Lifestyle  . Physical activity:    Days per week: Not on file    Minutes per session: Not  on file  . Stress: Not on file  Relationships  . Social connections:    Talks on phone: Not on file    Gets together: Not on file    Attends religious service: Not on file    Active member of club or organization: Not on file    Attends meetings of clubs or organizations: Not on file    Relationship status: Not on file  . Intimate partner violence:    Fear of current or ex partner: Not on file    Emotionally abused: Not on file    Physically abused: Not on file    Forced sexual activity: Not on file  Other Topics Concern  . Not on file  Social History Narrative  . Not on file    FAMILY HISTORY:  Family History  Problem Relation Age of Onset  . Arthritis  Unknown   . Cancer Unknown   . Diabetes Unknown   . Hypertension Mother   . Dementia Mother   . Diabetes Father   . ALS Father   . Diabetes Brother   . Hypertension Brother   . Cancer Paternal Aunt   . COPD Maternal Grandmother   . Cancer Maternal Grandfather   . Anesthesia problems Paternal Grandfather     CURRENT MEDICATIONS:  Outpatient Encounter Medications as of 11/01/2017  Medication Sig  . acyclovir (ZOVIRAX) 400 MG tablet Take 1 tablet (400 mg total) by mouth 2 (two) times daily.  Marland Kitchen albuterol (PROVENTIL HFA;VENTOLIN HFA) 108 (90 Base) MCG/ACT inhaler Inhale 1-2 puffs into the lungs daily as needed.  Marland Kitchen allopurinol (ZYLOPRIM) 300 MG tablet TAKE 1 TABLET BY MOUTH  DAILY FOR GOUT  . aspirin EC 81 MG tablet Take 81 mg by mouth daily.  . bumetanide (BUMEX) 1 MG tablet Take 1 tablet (1 mg total) by mouth daily.  Marland Kitchen dexamethasone (DECADRON) 4 MG tablet Take 10 tablets (40 mg total) by mouth once a week.  . diazepam (VALIUM) 5 MG tablet Take one qhs prn anxiety or insomnia.  . fluticasone (FLONASE) 50 MCG/ACT nasal spray Place 2 sprays into the nose 2 (two) times daily.  Marland Kitchen gabapentin (NEURONTIN) 300 MG capsule   . HYDROcodone-acetaminophen (NORCO/VICODIN) 5-325 MG tablet Take 1 tablet by mouth every 6 (six) hours as needed for moderate pain.  . Lactulose 20 GM/30ML SOLN Take 91m by mouth every 3-4 hours until bowel movement. Then take 335mby mouth daily.  . Marland Kitchenenalidomide (REVLIMID) 25 MG capsule Take 1 capsule by mouth daily x 14 days in a row followed by a 7 day rest period. Then resume regimen.  . Marland Kitchenosartan (COZAAR) 100 MG tablet Take 1 tablet (100 mg total) by mouth daily.  . meloxicam (MOBIC) 15 MG tablet TAKE 1 TABLET BY MOUTH ONCE DAILY  . metFORMIN (GLUCOPHAGE) 500 MG tablet TAKE 1 TABLET BY MOUTH ONCE DAILY WITH BREAKFAST  . nitrofurantoin, macrocrystal-monohydrate, (MACROBID) 100 MG capsule Take 100 mg by mouth daily.   . Marland Kitchenmeprazole (PRILOSEC) 20 MG capsule TAKE 1 CAPSULE BY  MOUTH  DAILY  . ondansetron (ZOFRAN) 8 MG tablet Take 1 tablet (8 mg total) by mouth every 8 (eight) hours as needed.  . polyethylene glycol powder (GLYCOLAX/MIRALAX) powder Take 1 Container by mouth daily.  . prochlorperazine (COMPAZINE) 10 MG tablet Take 1 tablet (10 mg total) by mouth every 6 (six) hours as needed for nausea or vomiting.  . Marland KitcheniZANidine (ZANAFLEX) 4 MG capsule Take 4 mg by mouth every 8 (eight) hours.  . valACYclovir (  VALTREX) 1000 MG tablet Take 2 tablets now and 2 tablets 12 hours later  . verapamil (CALAN-SR) 240 MG CR tablet TAKE 1 TABLET BY MOUTH AT  BEDTIME   Facility-Administered Encounter Medications as of 11/01/2017  Medication  . [COMPLETED] cyclophosphamide (CYTOXAN) 1,180 mg in sodium chloride 0.9 % 250 mL chemo infusion  . [COMPLETED] dexamethasone (DECADRON) 20 mg in sodium chloride 0.9 % 50 mL IVPB  . [COMPLETED] ondansetron (ZOFRAN) tablet 8 mg    ALLERGIES:  No Known Allergies   PHYSICAL EXAM:  ECOG Performance status: 1  I have reviewed her vital signs.   LABORATORY DATA:  I have reviewed the labs as listed.  CBC    Component Value Date/Time   WBC 5.1 11/01/2017 1342   RBC 3.04 (L) 11/01/2017 1342   HGB 9.2 (L) 11/01/2017 1342   HGB 14.2 08/02/2015 1113   HCT 28.6 (L) 11/01/2017 1342   HCT 41.1 08/02/2015 1113   PLT 256 11/01/2017 1342   PLT 371 08/02/2015 1113   MCV 94.1 11/01/2017 1342   MCV 84 08/02/2015 1113   MCH 30.3 11/01/2017 1342   MCHC 32.2 11/01/2017 1342   RDW 16.0 (H) 11/01/2017 1342   RDW 15.1 08/02/2015 1113   LYMPHSABS 1.5 11/01/2017 1342   LYMPHSABS 2.9 08/02/2015 1113   MONOABS 0.6 11/01/2017 1342   EOSABS 0.1 11/01/2017 1342   EOSABS 0.2 08/02/2015 1113   BASOSABS 0.0 11/01/2017 1342   BASOSABS 0.1 08/02/2015 1113   CMP Latest Ref Rng & Units 11/01/2017 10/29/2017 10/25/2017  Glucose 65 - 99 mg/dL 175(H) 146(H) 164(H)  BUN 6 - 20 mg/dL 21(H) 15 15  Creatinine 0.44 - 1.00 mg/dL 1.03(H) 0.91 0.89  Sodium 135 -  145 mmol/L 137 138 138  Potassium 3.5 - 5.1 mmol/L 3.3(L) 3.9 3.9  Chloride 101 - 111 mmol/L 101 102 102  CO2 22 - 32 mmol/L 22 20(L) 23  Calcium 8.9 - 10.3 mg/dL 8.6(L) 10.8(H) 10.6(H)  Total Protein 6.5 - 8.1 g/dL 8.9(H) 8.8(H) 8.4(H)  Total Bilirubin 0.3 - 1.2 mg/dL 0.8 0.7 0.7  Alkaline Phos 38 - 126 U/L 88 93 88  AST 15 - 41 U/L 67(H) 92(H) 59(H)  ALT 14 - 54 U/L 60(H) 71(H) 56(H)       DIAGNOSTIC IMAGING:  I have personally reviewed the images of the skeletal survey.    ASSESSMENT & PLAN:   Multiple myeloma ([facility]) 1.  IgG lambda multiple myeloma, stage II: - Presentation with lower back pain since March, presented to the emergency room on 10/25/2017 with right groin pain, CT scan showed multiple lytic lesions - SPEP shows 1.5 g/dL of monoclonal protein, beta-2 microglobulin of 4.8, free lambda light chains of 2869, kappa by lambda ratio of less than 0, elevated LDH -24-hour urine studies pending -I have reviewed skeletal survey which showed multiple lytic lesions. -I have discussed the preliminary reports of the bone marrow biopsy which showed 90% plasma cells.  FISH panel and chromosomes are pending. - She has a lot of pain in the right inframammary region.  As she has rapidly worsening clinical status, I have recommended starting therapy for multiple myeloma immediately.  We will use 3 drug regimen with bortezomib, lenalidomide and dexamethasone.  As there is a delay in getting lenalidomide, I have recommended starting with bortezomib and intravenous cyclophosphamide and dexamethasone today.  We discussed about the side effects in detail.  We have initiated the process of getting her lenalidomide.  I have told her that  we will give her 4-6 cycles of RVD followed by stem cell collection and autologous stem cell transplant.  She is agreeable to this plan.  We have given a prescription for acyclovir twice daily.  She was told to take aspirin daily.  She will come back next week for  her Velcade.  We have also sent a prescription for dexamethasone.  2.  Low back pain: She is taking hydrocodone as needed.  Pain is moderately controlled.  3.  Hypercalcemia: Her calcium was elevated at 10.8 on 10/29/2017.  She received fluids and Zometa.  Today her calcium has come down to 8.6.  We plan to continue Zometa once every 4 weeks.      Orders placed this encounter:  No orders of the defined types were placed in this encounter.     Derek Jack, MD [place] [phone]

## 2017-11-01 NOTE — Patient Instructions (Signed)
[place]   CHEMOTHERAPY INSTRUCTIONS  Today you will be receiving Cytoxan, Velcade, and Dexamethasone for the treatment of your Multiple myeloma. Goal of treatment is cure.   Cytoxan - can cause hemorrhagic cystitis (bloody urine) - this chemo irritates your bladder! We need you drinking 64 oz of fluid (preferably water/decaff fluids) 2 days prior to chemo and for up to 4-5 days after chemo. Drink more if you can. Do not hold your urine. Urinate before you go to bed and if you wake up in the middle of the night. This can also cause nausea/vomiting and hair loss. (takes 30 minutes to infuse). You will take this once or twice. Once every 7 days.You will only get this while we are waiting on your Revlimid to get approved and mailed to you.  Velcade - You will take this on Day 1, 4, 8, 11 every 21 days here at the [facility]. This will be an injection in your abdominal tissue. Side Effects: peripheral neuropathy, (numbness, tingling, burning in hands/fingers/feet/toes), hypotension, nausea, vomiting, diarrhea, blurred vision, fatigue, bone marrow suppression (low white blood cells-fight infection, low red blood cells- this makes up your hemoglobin & circulating blood volume, low platelets-this is what helps your blood to clot)   Dexamethasone 54m. You will take this once a week. In large doses, as this one, this helps eat away at the tumor cells, decreases pain, and makes the chemotherapy work better.  Revlimid - chemo med - side effects: neutropenia (low white blood cells), thrombocytopenia (low platelets), deep vein thrombosis (blood clot in extremity), pulmonary embolism (blood clot in lung), itching, rash, dry skin, diarrhea, constipation, nausea, vomiting, fatigue, dizziness, headache, muscle cramps/aches, inflammation of the nose and throat, fever, upper respiratory tract infections. You will take a 278mcapsule once a day x 14 days in a row followed by a 7 day  rest period. Then resume treatment cycle.    Other meds to take @ home:  Aspirin 812maily.   Lactulose (for constipation) 68m82mery 3-4 hours until bowel movement then only take once a day.   Acyclovir 400mg69mce a day. This is an anti-viral. This will add an extra layer of protection to your immune system since your immune sysem.   Zofran/Ondansetron 8mg t52met. Take 1 tablet every 8 hours as needed for nausea. Side Effect - can cause constipation.   Compazine/Prochlorperazine 10mg t27mt. May take 1 tablet every 6 hours as needed for nausea. Side Effect - sleepiness  Dexamethasone 4 tablet. Take 10 tablets by mouth once a week with food. This is part of your chemo regimen even though you are not on chemo.    SYMPTOMS TO REPORT AS SOON AS POSSIBLE AFTER TREATMENT:  FEVER GREATER THAN 100.5 F  CHILLS WITH OR WITHOUT FEVER  NAUSEA AND VOMITING THAT IS NOT CONTROLLED WITH YOUR NAUSEA MEDICATION  UNUSUAL SHORTNESS OF BREATH  UNUSUAL BRUISING OR BLEEDING  TENDERNESS IN MOUTH AND THROAT WITH OR WITHOUT PRESENCE OF ULCERS  URINARY PROBLEMS  BOWEL PROBLEMS  UNUSUAL RASH    Wear comfortable clothing and clothing appropriate for easy access to any Portacath or PICC line. Let us knowKoreaf there is anything that we can do to make your therapy better!      I have been informed and understand all of the instructions given to me and have received a copy. I have been instructed to call the clinic [phone] physician as soon as possible  for continued medical care, if indicated. I do not have any more questions at this time but understand that I may call the [facility] or the Patient Navigator at [phone] during office hours should I have questions or need assistance in obtaining follow-up care.

## 2017-11-01 NOTE — Progress Notes (Signed)
Patient to treatment room for chemotherapy.  Consent signed and documented.  Chemotherapy teaching completed with all questions asked and answered.  Pain rated 7 to 8 on scale.  Uses pain medications for relief.  Family at side.   Reviewed with Dr. Delton Coombes premed 30 minute wait time and times for dexamethasone, compazine, and zofran.  Ok to start cytoxan verbal order Dr. Delton Coombes.   Patient tolerated chemotherapy and hydration with no complaints voiced.  Peripheral IV site clean and dry with no bruising or swelling noted at site.  Denied pain at site.  Band aid applied.  VSS with discharge and left via wheelchair with family.  No s/s of distress noted.  Reviewed all information given with all questions answered.

## 2017-11-01 NOTE — Telephone Encounter (Signed)
PER ANNESHA H/[employer] AUTH IS NOT REQ FOR [id number] BY THE HEALTH PLAN. PER AIM WEBSITE AUTH IS NOT JACALYN, BIGGS Details Member #: [id number]  Treatment Start Date: 11/01/2017 Date of Birth: [birth year]  Health Plan: [facility] Ordering Provider: Please print this page for your records. Return to Search Results Begin another Request Print Order Summary Health Plan: [employer]  Scheduled Start Date: 11/01/2017  Member Information: YAILENE, BADIA Member #: [id number] Bailey [place],  [id number] Date of Birth: 1966/04/06 Phone: [phone] An AIM Order number is not required for this member and/or this exam. However, a health plan generated authorization may be required. Please contact the member's health plan using the phone number located on the back of the member's ID card to determine if an Order Number is required.

## 2017-11-01 NOTE — Telephone Encounter (Signed)
FAXED REVLIMID SCRIPT TO [facility]

## 2017-11-01 NOTE — Assessment & Plan Note (Signed)
1.  IgG lambda multiple myeloma, stage II: - Presentation with lower back pain since March, presented to the emergency room on 10/25/2017 with right groin pain, CT scan showed multiple lytic lesions - SPEP shows 1.5 g/dL of monoclonal protein, beta-2 microglobulin of 4.8, free lambda light chains of 2869, kappa by lambda ratio of less than 0, elevated LDH -24-hour urine studies pending -I have reviewed skeletal survey which showed multiple lytic lesions. -I have discussed the preliminary reports of the bone marrow biopsy which showed 90% plasma cells.  FISH panel and chromosomes are pending. - She has a lot of pain in the right inframammary region.  As she has rapidly worsening clinical status, I have recommended starting therapy for multiple myeloma immediately.  We will use 3 drug regimen with bortezomib, lenalidomide and dexamethasone.  As there is a delay in getting lenalidomide, I have recommended starting with bortezomib and intravenous cyclophosphamide and dexamethasone today.  We discussed about the side effects in detail.  We have initiated the process of getting her lenalidomide.  I have told her that we will give her 4-6 cycles of RVD followed by stem cell collection and autologous stem cell transplant.  She is agreeable to this plan.  We have given a prescription for acyclovir twice daily.  She was told to take aspirin daily.  She will come back next week for her Velcade.  We have also sent a prescription for dexamethasone.  2.  Low back pain: She is taking hydrocodone as needed.  Pain is moderately controlled.  3.  Hypercalcemia: Her calcium was elevated at 10.8 on 10/29/2017.  She received fluids and Zometa.  Today her calcium has come down to 8.6.  We plan to continue Zometa once every 4 weeks.

## 2017-11-05 ENCOUNTER — Ambulatory Visit (HOSPITAL_COMMUNITY): Payer: Self-pay

## 2017-11-05 ENCOUNTER — Encounter: Payer: Self-pay | Admitting: General Practice

## 2017-11-05 ENCOUNTER — Inpatient Hospital Stay (HOSPITAL_COMMUNITY): Payer: BLUE CROSS/BLUE SHIELD

## 2017-11-05 ENCOUNTER — Encounter (HOSPITAL_COMMUNITY): Payer: Self-pay

## 2017-11-05 ENCOUNTER — Telehealth (HOSPITAL_COMMUNITY): Payer: Self-pay | Admitting: Hematology

## 2017-11-05 VITALS — BP 168/99 | HR 106 | Temp 97.6°F | Resp 18

## 2017-11-05 DIAGNOSIS — K5900 Constipation, unspecified: Secondary | ICD-10-CM | POA: Diagnosis not present

## 2017-11-05 DIAGNOSIS — E119 Type 2 diabetes mellitus without complications: Secondary | ICD-10-CM | POA: Diagnosis not present

## 2017-11-05 DIAGNOSIS — I10 Essential (primary) hypertension: Secondary | ICD-10-CM | POA: Diagnosis not present

## 2017-11-05 DIAGNOSIS — M899 Disorder of bone, unspecified: Secondary | ICD-10-CM | POA: Diagnosis not present

## 2017-11-05 DIAGNOSIS — C9000 Multiple myeloma not having achieved remission: Secondary | ICD-10-CM | POA: Diagnosis not present

## 2017-11-05 DIAGNOSIS — M545 Low back pain: Secondary | ICD-10-CM | POA: Diagnosis not present

## 2017-11-05 MED ORDER — PROCHLORPERAZINE MALEATE 10 MG PO TABS
ORAL_TABLET | ORAL | Status: AC
  Filled 2017-11-05: qty 1

## 2017-11-05 MED ORDER — BORTEZOMIB CHEMO SQ INJECTION 3.5 MG (2.5MG/ML)
1.3000 mg/m2 | Freq: Once | INTRAMUSCULAR | Status: AC
  Administered 2017-11-05: 3 mg via SUBCUTANEOUS
  Filled 2017-11-05: qty 3

## 2017-11-05 MED ORDER — PROCHLORPERAZINE MALEATE 10 MG PO TABS
10.0000 mg | ORAL_TABLET | Freq: Once | ORAL | Status: AC
Start: 2017-11-05 — End: 2017-11-05
  Administered 2017-11-05: 10 mg via ORAL

## 2017-11-05 NOTE — Telephone Encounter (Signed)
pc to [facility] rx to che on revlimid status since the auth they sent over was rejected by [employer] stating they could not find the pt. Per csr Vashti Hey will contact [employer] and call me back with a status.

## 2017-11-05 NOTE — Progress Notes (Signed)
[facility] Psychosocial Distress Screening Clinical Social Work  Clinical Social Work was referred by distress screening protocol.  The patient scored a 1 on the Psychosocial Distress Thermometer which indicates mild distress. Clinical Social Worker Edwyna Shell to assess for distress and other psychosocial needs. Unable to reach patient by phone.  VM identified by name, CSW left VM w contact information as well as date/time of next support group and encouraged participation if desired.    ONCBCN DISTRESS SCREENING 10/29/2017  Distress experienced in past week (1-10) 1  Emotional problem type Adjusting to illness;Nervousness/Anxiety  Information Concerns Type Lack of info about diagnosis  Physical Problem type Pain  Physician notified of physical symptoms No  Referral to clinical psychology No  Referral to clinical social work No  Referral to dietition No  Referral to financial advocate No  Referral to support programs No  Referral to palliative care No    Clinical Social Worker follow up needed: No.  If yes, follow up plan:  Edwyna Shell, LCSW Clinical Social Worker Phone:  [phone]

## 2017-11-05 NOTE — Patient Instructions (Addendum)
Lenalidomide Oral Capsules What is this medicine? LENALIDOMIDE (len a LID oh mide) is a chemotherapy drug that targets specific proteins within cancer cells and stops the cancer cell from growing. It is used to treat multiple myeloma, mantle cell lymphoma, and some myelodysplastic syndromes that cause severe anemia requiring blood transfusions. This medicine may be used for other purposes; ask your health care provider or pharmacist if you have questions. COMMON BRAND NAME(S): Revlimid What should I tell my health care provider before I take this medicine? They need to know if you have any of these conditions: -blood clots in the legs or the lungs -high blood pressure -high cholesterol -infection -irregular monthly periods or menstrual cycles -kidney disease -liver disease -smoke tobacco -thyroid disease -an unusual or allergic reaction to lenalidomide, thalidomide, other medicines, foods, dyes, or preservatives -pregnant or trying to get pregnant -breast-feeding How should I use this medicine? Take this medicine by mouth with a glass of water. Follow the directions on the prescription label. Do not cut, crush, or chew this medicine. Take your medicine at regular intervals. Do not take it more often than directed. Do not stop taking except on your doctor's advice. A MedGuide will be given with each prescription and refill. Read this guide carefully each time. The MedGuide may change frequently. Talk to your pediatrician regarding the use of this medicine in children. Special care may be needed. Overdosage: If you think you have taken too much of this medicine contact a poison control center or emergency room at once. NOTE: This medicine is only for you. Do not share this medicine with others. What if I miss a dose? If you miss a dose, take it as soon as you can. If your next dose is to be taken in less than 12 hours, then do not take the missed dose. Take the next dose at your regular time.  Do not take double or extra doses. What may interact with this medicine? This medicine may interact with the following medications: -digoxin -medicines that increase the risk of thrombosis like estrogens or erythropoietic agents (e.g., epoetin alfa and darbepoetin alfa) -warfarin This list may not describe all possible interactions. Give your health care provider a list of all the medicines, herbs, non-prescription drugs, or dietary supplements you use. Also tell them if you smoke, drink alcohol, or use illegal drugs. Some items may interact with your medicine. What should I watch for while using this medicine? You may need blood work done while you are taking this medicine. This medicine is available only through a special program. Doctors, pharmacies, and patients must meet all of the conditions of the program. Your health care provider will help you get signed up with the program if you need this medicine. Through the program you will only receive up to a 28 day supply of the medicine at one time. You will need a new prescription for each refill. This medicine can cause birth defects. Do not get pregnant while taking this drug. Females with child-bearing potential will need to have 2 negative pregnancy tests before starting this medicine. Pregnancy testing must be done every 2 to 4 weeks as directed while taking this medicine. Use 2 reliable forms of birth control together while you are taking this medicine and for 4 weeks after you stop taking this medicine. If you think that you might be pregnant talk to your doctor right away. Do not breast-feed an infant while taking this medicine. Men must use a latex condom during sexual  contact with a woman while taking this medicine and for 4 weeks after you stop taking this medicine. A latex condom is needed even if you have had a vasectomy. Contact your doctor right away if your partner becomes pregnant. Do not donate sperm while taking this medicine and for  4 weeks after you stop taking this medicine. Do not give blood while taking the medicine and for 4 weeks after completion of treatment to avoid exposing pregnant women to the medicine through the donated blood. Talk to your doctor about your risk of cancer. You may be more at risk for certain types of cancers if you take this medicine. What side effects may I notice from receiving this medicine? Side effects that you should report to your doctor or health care professional as soon as possible: -allergic reactions like skin rash, itching or hives, swelling of the face, lips, or tongue -breathing problems -chest pain or tightness -fast, irregular heartbeat -fever with rash, swollen lymph nodes, or swelling of the face -low blood counts - this medicine may decrease the number of white blood cells, red blood cells and platelets. You may be at increased risk for infections and bleeding. -seizures -signs and symptoms of bleeding such as bloody or black, tarry stools; red or dark-brown urine; spitting up blood or brown material that looks like coffee grounds; red spots on the skin; unusual bruising or bleeding from the eye, gums, or nose -signs and symptoms of a blood clot such as breathing problems; changes in vision; chest pain; severe, sudden headache; pain, swelling, warmth in the leg; trouble speaking; sudden numbness or weakness of the face, arm or leg -signs and symptoms of liver injury like dark yellow or brown urine; general ill feeling or flu-like symptoms; light-colored stools; loss of appetite; nausea; right upper belly pain; unusually weak or tired; yellowing of the eyes or skin -signs and symptoms of a stroke like changes in vision; confusion; trouble speaking or understanding; severe headaches; sudden numbness or weakness of the face, arm or leg; trouble walking; dizziness; loss of balance or coordination -sweating -vomiting Side effects that usually do not require medical attention (report  to your doctor or health care professional if they continue or are bothersome): -constipation -cough -diarrhea -tiredness This list may not describe all possible side effects. Call your doctor for medical advice about side effects. You may report side effects to FDA at 1-800-FDA-1088. Where should I keep my medicine? Keep out of the reach of children. Store at room temperature between 15 and 30 degrees C (59 and 86 degrees F). Throw away any unused medicine after the expiration date. NOTE: This sheet is a summary. It may not cover all possible information. If you have questions about this medicine, talk to your doctor, pharmacist, or health care provider.  2018 Elsevier/Gold Standard (2016-03-27 16:43:51) [place] [facility]  Discharge Instructions: You received a velcade shot today like last Friday.  _______________________________________________________________  Thank you for choosing [facility] to provide your oncology and hematology care.  To afford each patient quality time with our providers, please arrive at least 15 minutes before your scheduled appointment.  You need to re-schedule your appointment if you arrive 10 or more minutes late.  We strive to give you quality time with our providers, and arriving late affects you and other patients whose appointments are after yours.  Also, if you no show three or more times for appointments you may  be dismissed from the clinic.  Again, thank you for choosing [facility] hope is that these requests will allow you access to exceptional care and in a timely manner. _______________________________________________________________  If you have questions after your visit, please contact our office at [phone] between the hours of 8:30 a.m. and 5:00 p.m. Voicemails left after 4:30 p.m. will not be returned until the following business  day. _______________________________________________________________  For prescription refill requests, have your pharmacy contact our office. _______________________________________________________________  Recommendations made by the consultant and any test results will be sent to your referring physician. _______________________________________________________________

## 2017-11-05 NOTE — Progress Notes (Signed)
To treatment area for velcade.  Patient stated her lower back pain is better after treatment on Friday and managing her hydrocodone.  No constipation or diarrhea.  No rashes, itching, or SOB.  Waiting on Revlimid prescription from [facility].  Family at side.   [place] number given to the patient to set up delivery.  Instructed the patient to call us when she has received the medication.  Hand out information was given to the patient and reviewed for handling, protective sex, and other safety issues.  Patient verbalized understanding.    Patient tolerated injection with no complaints voiced.  Injection site clean and dry with no bruising or swelling noted at site.  Band aid applied.  VSS with discharge and left via wheelchair with family.  Velcade Injection site on right side of abdomen reddened but no knot or heat to area. No complaints of pain on the right side of abdomen.

## 2017-11-07 ENCOUNTER — Telehealth (HOSPITAL_COMMUNITY): Payer: Self-pay

## 2017-11-07 ENCOUNTER — Telehealth (HOSPITAL_COMMUNITY): Payer: Self-pay | Admitting: Emergency Medicine

## 2017-11-07 NOTE — Telephone Encounter (Signed)
Pt received the revlimid today.  She will start taking the medication this evening.

## 2017-11-07 NOTE — Telephone Encounter (Signed)
Patient called and stated she has received her Revlimid from [place] and wanted to know when to start her Day 1.  Reviewed with Dr. Delton Coombes with instructions to start today.  Reviewed with the patient and counting of day 1-14 with understanding verbalized.  Also, reviewed decadron instructions, nausea medications, and lab work.  Understanding verbalized.  Reminded the patient she can call anytime for any questions.

## 2017-11-08 ENCOUNTER — Encounter (HOSPITAL_COMMUNITY): Payer: Self-pay

## 2017-11-08 ENCOUNTER — Inpatient Hospital Stay (HOSPITAL_COMMUNITY): Payer: BLUE CROSS/BLUE SHIELD | Attending: Hematology

## 2017-11-08 ENCOUNTER — Inpatient Hospital Stay (HOSPITAL_COMMUNITY): Payer: BLUE CROSS/BLUE SHIELD

## 2017-11-08 VITALS — BP 159/95 | HR 99 | Temp 98.1°F | Resp 18 | Wt 262.3 lb

## 2017-11-08 DIAGNOSIS — C9000 Multiple myeloma not having achieved remission: Secondary | ICD-10-CM

## 2017-11-08 DIAGNOSIS — Z5112 Encounter for antineoplastic immunotherapy: Secondary | ICD-10-CM | POA: Insufficient documentation

## 2017-11-08 LAB — CBC WITH DIFFERENTIAL/PLATELET
BASOS ABS: 0 10*3/uL (ref 0.0–0.1)
Basophils Relative: 1 %
Eosinophils Absolute: 0 10*3/uL (ref 0.0–0.7)
Eosinophils Relative: 1 %
HEMATOCRIT: 26.9 % — AB (ref 36.0–46.0)
HEMOGLOBIN: 8.8 g/dL — AB (ref 12.0–15.0)
LYMPHS PCT: 20 %
Lymphs Abs: 0.7 10*3/uL (ref 0.7–4.0)
MCH: 30.9 pg (ref 26.0–34.0)
MCHC: 32.7 g/dL (ref 30.0–36.0)
MCV: 94.4 fL (ref 78.0–100.0)
MONOS PCT: 7 %
Monocytes Absolute: 0.2 10*3/uL (ref 0.1–1.0)
NEUTROS PCT: 71 %
Neutro Abs: 2.6 10*3/uL (ref 1.7–7.7)
Platelets: 228 10*3/uL (ref 150–400)
RBC: 2.85 MIL/uL — AB (ref 3.87–5.11)
RDW: 16.1 % — ABNORMAL HIGH (ref 11.5–15.5)
WBC: 3.5 10*3/uL — AB (ref 4.0–10.5)

## 2017-11-08 LAB — COMPREHENSIVE METABOLIC PANEL
ALBUMIN: 3.7 g/dL (ref 3.5–5.0)
ALK PHOS: 81 U/L (ref 38–126)
ALT: 85 U/L — ABNORMAL HIGH (ref 14–54)
AST: 95 U/L — AB (ref 15–41)
Anion gap: 13 (ref 5–15)
BILIRUBIN TOTAL: 0.9 mg/dL (ref 0.3–1.2)
BUN: 15 mg/dL (ref 6–20)
CALCIUM: 8.4 mg/dL — AB (ref 8.9–10.3)
CO2: 20 mmol/L — AB (ref 22–32)
Chloride: 105 mmol/L (ref 101–111)
Creatinine, Ser: 0.78 mg/dL (ref 0.44–1.00)
GFR calc Af Amer: >60 mL/min (ref >60)
GFR calc non Af Amer: >60 mL/min (ref >60)
GLUCOSE: 280 mg/dL — AB (ref 65–99)
POTASSIUM: 3.5 mmol/L (ref 3.5–5.1)
SODIUM: 138 mmol/L (ref 135–145)
TOTAL PROTEIN: 8.3 g/dL — AB (ref 6.5–8.1)

## 2017-11-08 LAB — UIFE/LIGHT CHAINS/TP QN, 24-HR UR
FR LAMBDA LT CH,24HR: UNDETERMINED mg/(24.h)
Free Kappa Lt Chains,Ur: 76.1 mg/L — ABNORMAL HIGH (ref 1.35–24.19)
Free Kappa/Lambda Ratio: <0.01 — ABNORMAL LOW (ref 2.04–10.37)
Total Protein, Urine: 411.7 mg/dL

## 2017-11-08 MED ORDER — PROCHLORPERAZINE MALEATE 10 MG PO TABS
10.0000 mg | ORAL_TABLET | Freq: Once | ORAL | Status: AC
  Administered 2017-11-08: 10 mg via ORAL

## 2017-11-08 MED ORDER — BORTEZOMIB CHEMO SQ INJECTION 3.5 MG (2.5MG/ML)
1.3000 mg/m2 | Freq: Once | INTRAMUSCULAR | Status: AC
  Administered 2017-11-08: 3 mg via SUBCUTANEOUS
  Filled 2017-11-08: qty 3

## 2017-11-08 MED ORDER — PROCHLORPERAZINE MALEATE 10 MG PO TABS
ORAL_TABLET | ORAL | Status: AC
  Filled 2017-11-08: qty 1

## 2017-11-08 NOTE — Patient Instructions (Signed)
[facility] Discharge Instructions for Patients Receiving Chemotherapy  Today you received the following chemotherapy agents velcade.    If you develop nausea and vomiting that is not controlled by your nausea medication, call the clinic.   BELOW ARE SYMPTOMS THAT SHOULD BE REPORTED IMMEDIATELY:  *FEVER GREATER THAN 100.5 F  *CHILLS WITH OR WITHOUT FEVER  NAUSEA AND VOMITING THAT IS NOT CONTROLLED WITH YOUR NAUSEA MEDICATION  *UNUSUAL SHORTNESS OF BREATH  *UNUSUAL BRUISING OR BLEEDING  TENDERNESS IN MOUTH AND THROAT WITH OR WITHOUT PRESENCE OF ULCERS  *URINARY PROBLEMS  *BOWEL PROBLEMS  UNUSUAL RASH Items with * indicate a potential emergency and should be followed up as soon as possible.  Feel free to call the clinic should you have any questions or concerns. The clinic phone number is [phone].  Please show the CHEMO ALERT CARD at check-in to the Emergency Department and triage nurse.   

## 2017-11-08 NOTE — Progress Notes (Signed)
Reviewed lab work with Dr. Delton Coombes and calcium 8.4 today.  Patient to start Calcium 600mg  with Vitamin D 1,000 units or 2,000 units by mouth twice a day.  Stated pain is improved and rates 4 on scale for back discomfort.  Good appetite and did well with Revlimid and dexamethasone for todays visit.   Patient tolerated velcade shot with no complaints of pain.  Previous injection sites red with no heat, knots, or drainage. Injection site clean and dry with no bruising or swelling noted at site.  Band aid applied.  VSS with discharge and left via wheelchair with husband with no s/s of distress noted.

## 2017-11-11 ENCOUNTER — Other Ambulatory Visit (HOSPITAL_COMMUNITY): Payer: Self-pay

## 2017-11-11 ENCOUNTER — Ambulatory Visit (HOSPITAL_COMMUNITY): Payer: Self-pay

## 2017-11-12 ENCOUNTER — Encounter (HOSPITAL_COMMUNITY): Payer: Self-pay | Admitting: Hematology

## 2017-11-12 ENCOUNTER — Inpatient Hospital Stay (HOSPITAL_COMMUNITY): Payer: BLUE CROSS/BLUE SHIELD

## 2017-11-12 ENCOUNTER — Ambulatory Visit (HOSPITAL_COMMUNITY): Payer: Self-pay | Admitting: Hematology

## 2017-11-12 VITALS — BP 143/89 | HR 96 | Temp 98.0°F | Resp 18

## 2017-11-12 DIAGNOSIS — Z5112 Encounter for antineoplastic immunotherapy: Secondary | ICD-10-CM | POA: Diagnosis not present

## 2017-11-12 DIAGNOSIS — C9000 Multiple myeloma not having achieved remission: Secondary | ICD-10-CM

## 2017-11-12 MED ORDER — PROCHLORPERAZINE MALEATE 10 MG PO TABS
ORAL_TABLET | ORAL | Status: AC
  Filled 2017-11-12: qty 1

## 2017-11-12 MED ORDER — HYDROCODONE-ACETAMINOPHEN 5-325 MG PO TABS: 1.0000 | ORAL_TABLET | Freq: Four times a day (QID) | ORAL | 0 refills | Status: DC | PRN

## 2017-11-12 MED ORDER — PROCHLORPERAZINE MALEATE 10 MG PO TABS
10.0000 mg | ORAL_TABLET | Freq: Once | ORAL | Status: AC
  Administered 2017-11-12: 10 mg via ORAL
  Filled 2017-11-12: qty 1

## 2017-11-12 MED ORDER — BORTEZOMIB CHEMO SQ INJECTION 3.5 MG (2.5MG/ML)
1.3000 mg/m2 | Freq: Once | INTRAMUSCULAR | Status: AC
  Administered 2017-11-12: 3 mg via SUBCUTANEOUS
  Filled 2017-11-12: qty 3

## 2017-11-12 NOTE — Progress Notes (Signed)
Labs done on 11-08-2017, here today for velcade. No new complaints noted. Proceed with treatment plan.  Treatment given per orders. Patient tolerated it well without problems. Vitals stable and discharged home from clinic ambulatory. Follow up as scheduled.   Pain medication refilled by MD.

## 2017-11-13 ENCOUNTER — Encounter (HOSPITAL_COMMUNITY): Payer: Self-pay

## 2017-11-13 ENCOUNTER — Ambulatory Visit (HOSPITAL_COMMUNITY): Payer: Self-pay | Admitting: Hematology

## 2017-11-13 NOTE — Patient Instructions (Signed)
[facility] Discharge Instructions for Patients Receiving Chemotherapy   Beginning January 23rd 2017 lab work for the Cancer Center will be done in the  Main lab at [facility] on 1st floor. If you have a lab appointment with the Cancer Center please come in thru the  Main Entrance and check in at the main information desk   Today you received the following chemotherapy agents   To help prevent nausea and vomiting after your treatment, we encourage you to take your nausea medication     If you develop nausea and vomiting, or diarrhea that is not controlled by your medication, call the clinic.  The clinic phone number is [phone]. Office hours are Monday-Friday 8:30am-5:00pm.  BELOW ARE SYMPTOMS THAT SHOULD BE REPORTED IMMEDIATELY:  *FEVER GREATER THAN 101.0 F  *CHILLS WITH OR WITHOUT FEVER  NAUSEA AND VOMITING THAT IS NOT CONTROLLED WITH YOUR NAUSEA MEDICATION  *UNUSUAL SHORTNESS OF BREATH  *UNUSUAL BRUISING OR BLEEDING  TENDERNESS IN MOUTH AND THROAT WITH OR WITHOUT PRESENCE OF ULCERS  *URINARY PROBLEMS  *BOWEL PROBLEMS  UNUSUAL RASH Items with * indicate a potential emergency and should be followed up as soon as possible. If you have an emergency after office hours please contact your primary care physician or go to the nearest emergency department.  Please call the clinic during office hours if you have any questions or concerns.   You may also contact the Patient Navigator at [phone] should you have any questions or need assistance in obtaining follow up care.      Resources For Cancer Patients and their Caregivers ? American Cancer Society: Can assist with transportation, wigs, general needs, runs Look Good Feel Better.        1-888-227-6333 ? Cancer Care: Provides financial assistance, online support groups, medication/co-pay assistance.  1-800-813-HOPE (4673) ? [facility] Assists [place] cancer  patients and their families through emotional , educational and financial support.  336-427-4357 ? [place] DSS Where to apply for food stamps, Medicaid and utility assistance. 336-342-1394 ? [facility]: Transportation to medical appointments. 336-347-2287 ? Social Security Administration: May apply for disability if have a Stage IV cancer. 336-342-7796 1-800-772-1213 ? [place] Aging, Disability and Transit Services: Assists with nutrition, care and transit needs. 336-349-2343         

## 2017-11-14 LAB — CHROMOSOME ANALYSIS, BONE MARROW

## 2017-11-14 LAB — TISSUE HYBRIDIZATION (BONE MARROW)-NCBH

## 2017-11-19 ENCOUNTER — Other Ambulatory Visit (HOSPITAL_COMMUNITY): Payer: Self-pay

## 2017-11-19 ENCOUNTER — Other Ambulatory Visit (HOSPITAL_COMMUNITY): Payer: Self-pay | Admitting: Emergency Medicine

## 2017-11-19 DIAGNOSIS — C9000 Multiple myeloma not having achieved remission: Secondary | ICD-10-CM

## 2017-11-20 ENCOUNTER — Other Ambulatory Visit (HOSPITAL_COMMUNITY): Payer: Self-pay | Admitting: Emergency Medicine

## 2017-11-20 DIAGNOSIS — C9000 Multiple myeloma not having achieved remission: Secondary | ICD-10-CM

## 2017-11-20 MED ORDER — LENALIDOMIDE 25 MG PO CAPS: ORAL_CAPSULE | ORAL | 3 refills | Status: DC

## 2017-11-20 NOTE — Progress Notes (Signed)
revlimid refilled

## 2017-11-22 ENCOUNTER — Encounter (HOSPITAL_COMMUNITY): Payer: Self-pay | Admitting: Hematology

## 2017-11-22 ENCOUNTER — Inpatient Hospital Stay (HOSPITAL_BASED_OUTPATIENT_CLINIC_OR_DEPARTMENT_OTHER): Payer: BLUE CROSS/BLUE SHIELD | Admitting: Hematology

## 2017-11-22 ENCOUNTER — Other Ambulatory Visit: Payer: Self-pay

## 2017-11-22 ENCOUNTER — Inpatient Hospital Stay (HOSPITAL_COMMUNITY): Payer: BLUE CROSS/BLUE SHIELD

## 2017-11-22 VITALS — BP 152/89 | HR 98 | Temp 98.1°F | Resp 18 | Wt 263.3 lb

## 2017-11-22 DIAGNOSIS — M898X8 Other specified disorders of bone, other site: Secondary | ICD-10-CM | POA: Diagnosis not present

## 2017-11-22 DIAGNOSIS — C9000 Multiple myeloma not having achieved remission: Secondary | ICD-10-CM | POA: Diagnosis not present

## 2017-11-22 DIAGNOSIS — M545 Low back pain: Secondary | ICD-10-CM | POA: Diagnosis not present

## 2017-11-22 DIAGNOSIS — E119 Type 2 diabetes mellitus without complications: Secondary | ICD-10-CM

## 2017-11-22 DIAGNOSIS — Z5111 Encounter for antineoplastic chemotherapy: Secondary | ICD-10-CM

## 2017-11-22 DIAGNOSIS — Z5112 Encounter for antineoplastic immunotherapy: Secondary | ICD-10-CM | POA: Diagnosis not present

## 2017-11-22 LAB — CBC WITH DIFFERENTIAL/PLATELET
Basophils Absolute: 0 10*3/uL (ref 0.0–0.1)
Basophils Relative: 0 %
EOS PCT: 3 %
Eosinophils Absolute: 0.1 10*3/uL (ref 0.0–0.7)
HCT: 29.5 % — ABNORMAL LOW (ref 36.0–46.0)
HEMOGLOBIN: 9.7 g/dL — AB (ref 12.0–15.0)
LYMPHS ABS: 0.8 10*3/uL (ref 0.7–4.0)
LYMPHS PCT: 29 %
MCH: 31.2 pg (ref 26.0–34.0)
MCHC: 32.9 g/dL (ref 30.0–36.0)
MCV: 94.9 fL (ref 78.0–100.0)
Monocytes Absolute: 0.4 10*3/uL (ref 0.1–1.0)
Monocytes Relative: 15 %
NEUTROS PCT: 53 %
Neutro Abs: 1.4 10*3/uL — ABNORMAL LOW (ref 1.7–7.7)
Platelets: 234 10*3/uL (ref 150–400)
RBC: 3.11 MIL/uL — AB (ref 3.87–5.11)
RDW: 16.3 % — ABNORMAL HIGH (ref 11.5–15.5)
WBC: 2.7 10*3/uL — AB (ref 4.0–10.5)

## 2017-11-22 LAB — COMPREHENSIVE METABOLIC PANEL
ALK PHOS: 120 U/L (ref 38–126)
ALT: 69 U/L — AB (ref 14–54)
AST: 62 U/L — ABNORMAL HIGH (ref 15–41)
Albumin: 3.8 g/dL (ref 3.5–5.0)
Anion gap: 13 (ref 5–15)
BUN: 18 mg/dL (ref 6–20)
CALCIUM: 9.1 mg/dL (ref 8.9–10.3)
CO2: 25 mmol/L (ref 22–32)
CREATININE: 0.79 mg/dL (ref 0.44–1.00)
Chloride: 99 mmol/L — ABNORMAL LOW (ref 101–111)
Glucose, Bld: 225 mg/dL — ABNORMAL HIGH (ref 65–99)
Potassium: 3.2 mmol/L — ABNORMAL LOW (ref 3.5–5.1)
Sodium: 137 mmol/L (ref 135–145)
Total Bilirubin: 0.9 mg/dL (ref 0.3–1.2)
Total Protein: 7.1 g/dL (ref 6.5–8.1)

## 2017-11-22 MED ORDER — BORTEZOMIB CHEMO SQ INJECTION 3.5 MG (2.5MG/ML)
1.3000 mg/m2 | Freq: Once | INTRAMUSCULAR | Status: AC
  Administered 2017-11-22: 3 mg via SUBCUTANEOUS
  Filled 2017-11-22: qty 3

## 2017-11-22 MED ORDER — PROCHLORPERAZINE MALEATE 10 MG PO TABS
ORAL_TABLET | ORAL | Status: AC
  Filled 2017-11-22: qty 1

## 2017-11-22 MED ORDER — PROCHLORPERAZINE MALEATE 10 MG PO TABS
10.0000 mg | ORAL_TABLET | Freq: Once | ORAL | Status: AC
  Administered 2017-11-22: 10 mg via ORAL

## 2017-11-22 NOTE — Patient Instructions (Signed)
[facility] at [facility] Discharge Instructions  Today you saw Dr. K.   Thank you for choosing [facility] at [facility] to provide your oncology and hematology care.  To afford each patient quality time with our provider, please arrive at least 15 minutes before your scheduled appointment time.   If you have a lab appointment with the Cancer Center please come in thru the  Main Entrance and check in at the main information desk  You need to re-schedule your appointment should you arrive 10 or more minutes late.  We strive to give you quality time with our providers, and arriving late affects you and other patients whose appointments are after yours.  Also, if you no show three or more times for appointments you may be dismissed from the clinic at the providers discretion.     Again, thank you for choosing [facility].  Our hope is that these requests will decrease the amount of time that you wait before being seen by our physicians.       _____________________________________________________________  Should you have questions after your visit to [facility], please contact our office at [phone] between the hours of 8:30 a.m. and 4:30 p.m.  Voicemails left after 4:30 p.m. will not be returned until the following business day.  For prescription refill requests, have your pharmacy contact our office.       Resources For Cancer Patients and their Caregivers ? American Cancer Society: Can assist with transportation, wigs, general needs, runs Look Good Feel Better.        1-888-227-6333 ? Cancer Care: Provides financial assistance, online support groups, medication/co-pay assistance.  1-800-813-HOPE (4673) ? [facility] Assists [place] cancer patients and their families through emotional , educational and financial support.  336-427-4357 ? [place] DSS Where to apply for food  stamps, Medicaid and utility assistance. 336-342-1394 ? [facility]: Transportation to medical appointments. 336-347-2287 ? Social Security Administration: May apply for disability if have a Stage IV cancer. 336-342-7796 1-800-772-1213 ? [place] Aging, Disability and Transit Services: Assists with nutrition, care and transit needs. 336-349-2343  Cancer Center Support Programs:   > Cancer Support Group  2nd Tuesday of the month 1pm-2pm, Journey Room   > Creative Journey  3rd Tuesday of the month 1130am-1pm, Journey Room    

## 2017-11-22 NOTE — Progress Notes (Signed)
Joanna Reid presents today for injection per the provider's orders.  Velcade administration without incident; see MAR for injection details.  Patient tolerated procedure well and without incident.  No questions or complaints noted at this time.  Discharged ambulatory in c/o spouse.  

## 2017-11-22 NOTE — Progress Notes (Signed)
Joanna Reid, Joanna Reid [id number]   CLINIC:  Medical Oncology/Hematology  PCP:  Mikey Kirschner, [place] [address] [phone]   REASON FOR VISIT:  Follow-up for multiple myeloma.  CURRENT THERAPY: Revlimid/Velcade/Decadron  BRIEF ONCOLOGIC HISTORY:    Multiple myeloma ([place])   10/29/2017 Initial Diagnosis    Multiple myeloma ([place])      11/01/2017 -  Chemotherapy    The patient had bortezomib SQ (VELCADE) chemo injection 3 mg, 1.3 mg/m2 = 3 mg, Subcutaneous,  Once, 2 of 4 cycles Administration: 3 mg (11/01/2017), 3 mg (11/08/2017), 3 mg (11/05/2017), 3 mg (11/22/2017), 3 mg (11/12/2017)  for chemotherapy treatment.         INTERVAL HISTORY:  Joanna Reid 52 y.o. female returns for follow-up for multiple myeloma.   Here today with family.  States that she "is feeling better every day."   States that she had her last Revlimid dose on 11/20/17.  Denies any diarrhea, constipation, peripheral neuropathy. She takes the Decadron once weekly.  States that she is taking aspirin, acyclovir, & calcium/vitamin D supplementation.  She feels like she is tolerating Velcade well.    Her pain is well-controlled; her pain is mostly in her bilateral rib areas.  Reports that she generally takes Norco about 3 times per day.   Lab results reviewed with patient in detail today. FISH on her bone marrow biopsy resulted and shows deletion 17p; the aggressive nature of her disease given this finding.    She has thought about stem cell transplantation; she would like to consider [facility] for her transplant team. We will help facilitate this referral.      REVIEW OF SYSTEMS:  Review of Systems  Musculoskeletal:       Bilateral lower rib pains present.  All other systems reviewed and are negative.    PAST MEDICAL/SURGICAL HISTORY:  Past Medical History:  Diagnosis Date  . Acid reflux   . Allergic rhinitis   . Diabetes mellitus    type 2  . Gout   . Gout   . HBP (high blood pressure)   . History of kidney stones   . Migraines    Past Surgical History:  Procedure Laterality Date  . CESAREAN SECTION    . EXTRACORPOREAL SHOCK WAVE LITHOTRIPSY Left 10/10/2017   Procedure: LEFT EXTRACORPOREAL SHOCK WAVE LITHOTRIPSY (ESWL);  Surgeon: Bjorn Loser, MD;  Location: [facility];  Service: Urology;  Laterality: Left;  . EYE SURGERY    . HEMORRHOID SURGERY N/A 11/19/2012   Procedure: HEMORRHOIDECTOMY;  Surgeon: Jamesetta So, MD;  Location: [facility];  Service: General;  Laterality: N/A;  . kidney stones  1998  . LAPAROSCOPIC UNILATERAL SALPINGO OOPHERECTOMY  05/14/2012   Procedure: LAPAROSCOPIC UNILATERAL SALPINGO OOPHORECTOMY;  Surgeon: [name], MD;  Location: [facility];  Service: Gynecology;  Laterality: Right;  laparoscopic right salpingo-oophorectomy  . PARTIAL HYSTERECTOMY    . TONSILECTOMY, ADENOIDECTOMY, BILATERAL MYRINGOTOMY AND TUBES    . VESICOVAGINAL FISTULA CLOSURE W/ TAH       SOCIAL HISTORY:  Social History   Socioeconomic History  . Marital status: Married    Spouse name: Not on file  . Number of children: Not on file  . Years of education: 30  . Highest education level: Not on file  Occupational History    Employer: [employer]  . Financial resource strain: Not on file  . Food insecurity:  Worry: Never true    Inability: Never true  . Transportation needs:    Medical: Not on file    Non-medical: Not on file  Tobacco Use  . Smoking status: Former Smoker    Last attempt to quit: 02/27/1999    Years since quitting: 18.7  . Smokeless tobacco: Never Used  . Tobacco comment: socially   Substance and Sexual Activity  . Alcohol use: No    Alcohol/week: 0.0 oz  . Drug use: No  . Sexual activity: Yes  Lifestyle  . Physical activity:    Days per week: Not on file    Minutes per session: Not on file  . Stress: Not on file  Relationships  . Social connections:    Talks on phone: Not  on file    Gets together: Not on file    Attends religious service: Not on file    Active member of club or organization: Not on file    Attends meetings of clubs or organizations: Not on file    Relationship status: Not on file  . Intimate partner violence:    Fear of current or ex partner: Not on file    Emotionally abused: Not on file    Physically abused: Not on file    Forced sexual activity: Not on file  Other Topics Concern  . Not on file  Social History Narrative  . Not on file    FAMILY HISTORY:  Family History  Problem Relation Age of Onset  . Arthritis Unknown   . Cancer Unknown   . Diabetes Unknown   . Hypertension Mother   . Dementia Mother   . Diabetes Father   . ALS Father   . Diabetes Brother   . Hypertension Brother   . Cancer Paternal Aunt   . COPD Maternal Grandmother   . Cancer Maternal Grandfather   . Anesthesia problems Paternal Grandfather     CURRENT MEDICATIONS:  Outpatient Encounter Medications as of 11/22/2017  Medication Sig Note  . acyclovir (ZOVIRAX) 400 MG tablet Take 1 tablet (400 mg total) by mouth 2 (two) times daily.   Marland Kitchen albuterol (PROVENTIL HFA;VENTOLIN HFA) 108 (90 Base) MCG/ACT inhaler Inhale 1-2 puffs into the lungs daily as needed.   Marland Kitchen allopurinol (ZYLOPRIM) 300 MG tablet TAKE 1 TABLET BY MOUTH  DAILY FOR GOUT   . aspirin EC 81 MG tablet Take 81 mg by mouth daily.   . bumetanide (BUMEX) 1 MG tablet Take 1 tablet (1 mg total) by mouth daily.   . Calcium Carb-Cholecalciferol (CALCIUM 600+D3 PO) Take by mouth. 11/08/2017: Calcium 648m with Vitamin D 1,000 units BID  . dexamethasone (DECADRON) 4 MG tablet Take 10 tablets (40 mg total) by mouth once a week.   . diazepam (VALIUM) 5 MG tablet Take one qhs prn anxiety or insomnia.   . fluticasone (FLONASE) 50 MCG/ACT nasal spray Place 2 sprays into the nose 2 (two) times daily.   .Marland Kitchengabapentin (NEURONTIN) 300 MG capsule    . HYDROcodone-acetaminophen (NORCO/VICODIN) 5-325 MG tablet Take 1  tablet by mouth every 6 (six) hours as needed for moderate pain.   . Lactulose 20 GM/30ML SOLN Take 327mby mouth every 3-4 hours until bowel movement. Then take 3078my mouth daily.   . lMarland Kitchennalidomide (REVLIMID) 25 MG capsule Take 1 capsule by mouth daily x 14 days in a row followed by a 7 day rest period. Then resume regimen.   . lMarland Kitchensartan (COZAAR) 100 MG tablet Take 1 tablet (  100 mg total) by mouth daily.   . meloxicam (MOBIC) 15 MG tablet TAKE 1 TABLET BY MOUTH ONCE DAILY   . metFORMIN (GLUCOPHAGE) 500 MG tablet TAKE 1 TABLET BY MOUTH ONCE DAILY WITH BREAKFAST   . nitrofurantoin, macrocrystal-monohydrate, (MACROBID) 100 MG capsule Take 100 mg by mouth daily.    Marland Kitchen omeprazole (PRILOSEC) 20 MG capsule TAKE 1 CAPSULE BY MOUTH  DAILY   . ondansetron (ZOFRAN) 8 MG tablet Take 1 tablet (8 mg total) by mouth every 8 (eight) hours as needed.   . polyethylene glycol powder (GLYCOLAX/MIRALAX) powder Take 1 Container by mouth daily.   . prochlorperazine (COMPAZINE) 10 MG tablet Take 1 tablet (10 mg total) by mouth every 6 (six) hours as needed for nausea or vomiting.   Marland Kitchen tiZANidine (ZANAFLEX) 4 MG capsule Take 4 mg by mouth every 8 (eight) hours.   . valACYclovir (VALTREX) 1000 MG tablet Take 2 tablets now and 2 tablets 12 hours later   . verapamil (CALAN-SR) 240 MG CR tablet TAKE 1 TABLET BY MOUTH AT  BEDTIME    No facility-administered encounter medications on file as of 11/22/2017.     ALLERGIES:  No Known Allergies   PHYSICAL EXAM:  ECOG Performance status: 1  I have reviewed her vital signs.   LABORATORY DATA:  I have reviewed the labs as listed.  CBC    Component Value Date/Time   WBC 2.7 (L) 11/22/2017 0831   RBC 3.11 (L) 11/22/2017 0831   HGB 9.7 (L) 11/22/2017 0831   HGB 14.2 08/02/2015 1113   HCT 29.5 (L) 11/22/2017 0831   HCT 41.1 08/02/2015 1113   PLT 234 11/22/2017 0831   PLT 371 08/02/2015 1113   MCV 94.9 11/22/2017 0831   MCV 84 08/02/2015 1113   MCH 31.2 11/22/2017 0831    MCHC 32.9 11/22/2017 0831   RDW 16.3 (H) 11/22/2017 0831   RDW 15.1 08/02/2015 1113   LYMPHSABS 0.8 11/22/2017 0831   LYMPHSABS 2.9 08/02/2015 1113   MONOABS 0.4 11/22/2017 0831   EOSABS 0.1 11/22/2017 0831   EOSABS 0.2 08/02/2015 1113   BASOSABS 0.0 11/22/2017 0831   BASOSABS 0.1 08/02/2015 1113   CMP Latest Ref Rng & Units 11/22/2017 11/08/2017 11/01/2017  Glucose 65 - 99 mg/dL 225(H) 280(H) 175(H)  BUN 6 - 20 mg/dL 18 15 21(H)  Creatinine 0.44 - 1.00 mg/dL 0.79 0.78 1.03(H)  Sodium 135 - 145 mmol/L 137 138 137  Potassium 3.5 - 5.1 mmol/L 3.2(L) 3.5 3.3(L)  Chloride 101 - 111 mmol/L 99(L) 105 101  CO2 22 - 32 mmol/L 25 20(L) 22  Calcium 8.9 - 10.3 mg/dL 9.1 8.4(L) 8.6(L)  Total Protein 6.5 - 8.1 g/dL 7.1 8.3(H) 8.9(H)  Total Bilirubin 0.3 - 1.2 mg/dL 0.9 0.9 0.8  Alkaline Phos 38 - 126 U/L 120 81 88  AST 15 - 41 U/L 62(H) 95(H) 67(H)  ALT 14 - 54 U/L 69(H) 85(H) 60(H)       DIAGNOSTIC IMAGING:  I have personally reviewed the images of the skeletal survey.    ASSESSMENT & PLAN:   Multiple myeloma ([place]) 1.  IgG lambda multiple myeloma, stage III, del 17p: - Presentation with lower back pain since March, presented to the emergency room on 10/25/2017 with right groin pain, CT scan showed multiple lytic lesions, multiple lytic lesions on skeletal survey - SPEP shows 1.5 g/dL of monoclonal protein, beta-2 microglobulin of 4.8, free lambda light chains of 2869, kappa by lambda ratio of less than 0,  elevated LDH -Chromosome analysis showed no metaphase chromosomes.  FISH panel shows -14, -12, 13 q.-/-13 and 17p-.  Bone marrow biopsy shows 90% plasma cells. -Cycle 1 of RVD started on 11/01/2017, received IV Cytoxan in the first week secondary to delay of getting Revlimid finished her 14 days of Revlimid on 11/20/2017.  She tolerated it very well.  She denied any diarrhea.  Special studies from today are pending.  We have discussed referring her to a transplant center given her high  risk fish panel.  She would like to go to [facility].  We will begin the referral process.  2.  Low back pain: She is taking hydrocodone as needed.  Pain has improved since the start of therapy for multiple myeloma.  She is requiring up to 3 tablets a day.  3.  Hypercalcemia: Her calcium was elevated at 10.8 on 10/29/2017.  She received fluids and Zometa.  Today her calcium has come down to 8.6.  We plan to continue Zometa once every 4 weeks.  4.  ID prophylaxis: She will continue acyclovir twice daily.  She will also continue aspirin 81 mg daily for DVT prophylaxis.      Orders placed this encounter:  No orders of the defined types were placed in this encounter.    This note includes documentation from Mike Craze, NP, who was present during this patient's office visit and evaluation.  I have reviewed this note for its completeness and accuracy.  I have edited this note accordingly based on my findings and medical opinion.      Derek Jack, MD [facility] [phone]

## 2017-11-22 NOTE — Assessment & Plan Note (Addendum)
1.  IgG lambda multiple myeloma, stage III, del 17p: - Presentation with lower back pain since March, presented to the emergency room on 10/25/2017 with right groin pain, CT scan showed multiple lytic lesions, multiple lytic lesions on skeletal survey - SPEP shows 1.5 g/dL of monoclonal protein, beta-2 microglobulin of 4.8, free lambda light chains of 2869, kappa by lambda ratio of less than 0, elevated LDH -Chromosome analysis showed no metaphase chromosomes.  FISH panel shows -14, -12, 13 q.-/-13 and 17p-.  Bone marrow biopsy shows 90% plasma cells. -Cycle 1 of RVD started on 11/01/2017, received IV Cytoxan in the first week secondary to delay of getting Revlimid finished her 14 days of Revlimid on 11/20/2017.  She tolerated it very well.  She denied any diarrhea.  Special studies from today are pending.  We have discussed referring her to a transplant center given her high risk fish panel.  She would like to go to [facility].  We will begin the referral process.  2.  Low back pain: She is taking hydrocodone as needed.  Pain has improved since the start of therapy for multiple myeloma.  She is requiring up to 3 tablets a day.  3.  Hypercalcemia: Her calcium was elevated at 10.8 on 10/29/2017.  She received fluids and Zometa.  Today her calcium has come down to 8.6.  We plan to continue Zometa once every 4 weeks.  4.  ID prophylaxis: She will continue acyclovir twice daily.  She will also continue aspirin 81 mg daily for DVT prophylaxis.

## 2017-11-22 NOTE — Progress Notes (Signed)
Faxed patient records to [facility] for referral to Dr. Mylinda Latina.  Records faxed to Attn: Lakisha [phone].  Dr. Laverta [place] will review the records and his office will contact patient for scheduling.

## 2017-11-26 ENCOUNTER — Inpatient Hospital Stay (HOSPITAL_COMMUNITY): Payer: BLUE CROSS/BLUE SHIELD

## 2017-11-26 ENCOUNTER — Other Ambulatory Visit: Payer: Self-pay

## 2017-11-26 ENCOUNTER — Encounter (HOSPITAL_COMMUNITY): Payer: Self-pay

## 2017-11-26 ENCOUNTER — Other Ambulatory Visit (HOSPITAL_COMMUNITY): Payer: Self-pay

## 2017-11-26 VITALS — BP 162/97 | HR 95 | Temp 98.0°F | Resp 18 | Wt 265.0 lb

## 2017-11-26 DIAGNOSIS — C9000 Multiple myeloma not having achieved remission: Secondary | ICD-10-CM | POA: Diagnosis not present

## 2017-11-26 DIAGNOSIS — Z5112 Encounter for antineoplastic immunotherapy: Secondary | ICD-10-CM | POA: Diagnosis not present

## 2017-11-26 MED ORDER — ZOLEDRONIC ACID 4 MG/100ML IV SOLN
4.0000 mg | Freq: Once | INTRAVENOUS | Status: AC
  Administered 2017-11-26: 4 mg via INTRAVENOUS
  Filled 2017-11-26: qty 100

## 2017-11-26 MED ORDER — PROCHLORPERAZINE MALEATE 10 MG PO TABS
10.0000 mg | ORAL_TABLET | Freq: Once | ORAL | Status: AC
  Administered 2017-11-26: 10 mg via ORAL
  Filled 2017-11-26: qty 1

## 2017-11-26 MED ORDER — BORTEZOMIB CHEMO SQ INJECTION 3.5 MG (2.5MG/ML)
1.3000 mg/m2 | Freq: Once | INTRAMUSCULAR | Status: AC
  Administered 2017-11-26: 3 mg via SUBCUTANEOUS
  Filled 2017-11-26: qty 1.2

## 2017-11-26 MED ORDER — SODIUM CHLORIDE 0.9 % IV SOLN
INTRAVENOUS | Status: DC
  Administered 2017-11-26: 15:00:00 via INTRAVENOUS

## 2017-11-26 NOTE — Progress Notes (Signed)
Joanna Reid presents today for Zometa infusion and Velcade injection per the provider's orders.  Both administrations without incident; see MAR for injection details.  Patient tolerated procedure well and without incident.  No questions or complaints noted at this time.  Discharged ambulatory in c/o spouse.

## 2017-11-29 ENCOUNTER — Encounter (HOSPITAL_COMMUNITY): Payer: Self-pay

## 2017-11-29 ENCOUNTER — Inpatient Hospital Stay (HOSPITAL_COMMUNITY): Payer: BLUE CROSS/BLUE SHIELD

## 2017-11-29 VITALS — BP 140/90 | HR 94 | Temp 98.0°F | Resp 18 | Wt 264.7 lb

## 2017-11-29 DIAGNOSIS — Z5112 Encounter for antineoplastic immunotherapy: Secondary | ICD-10-CM | POA: Diagnosis not present

## 2017-11-29 DIAGNOSIS — C9000 Multiple myeloma not having achieved remission: Secondary | ICD-10-CM

## 2017-11-29 LAB — CBC WITH DIFFERENTIAL/PLATELET
BASOS ABS: 0 10*3/uL (ref 0.0–0.1)
Basophils Relative: 0 %
Eosinophils Absolute: 0 10*3/uL (ref 0.0–0.7)
Eosinophils Relative: 1 %
HEMATOCRIT: 29.8 % — AB (ref 36.0–46.0)
HEMOGLOBIN: 9.8 g/dL — AB (ref 12.0–15.0)
Lymphocytes Relative: 19 %
Lymphs Abs: 0.7 10*3/uL (ref 0.7–4.0)
MCH: 31.7 pg (ref 26.0–34.0)
MCHC: 32.9 g/dL (ref 30.0–36.0)
MCV: 96.4 fL (ref 78.0–100.0)
MONO ABS: 0.3 10*3/uL (ref 0.1–1.0)
MONOS PCT: 8 %
NEUTROS ABS: 2.8 10*3/uL (ref 1.7–7.7)
Neutrophils Relative %: 72 %
Platelets: 186 10*3/uL (ref 150–400)
RBC: 3.09 MIL/uL — ABNORMAL LOW (ref 3.87–5.11)
RDW: 16 % — ABNORMAL HIGH (ref 11.5–15.5)
WBC: 3.9 10*3/uL — ABNORMAL LOW (ref 4.0–10.5)

## 2017-11-29 LAB — COMPREHENSIVE METABOLIC PANEL
ALBUMIN: 3.8 g/dL (ref 3.5–5.0)
ALK PHOS: 181 U/L — AB (ref 38–126)
ALT: 55 U/L — AB (ref 14–54)
ANION GAP: 11 (ref 5–15)
AST: 60 U/L — ABNORMAL HIGH (ref 15–41)
BILIRUBIN TOTAL: 1 mg/dL (ref 0.3–1.2)
BUN: 15 mg/dL (ref 6–20)
CALCIUM: 9.2 mg/dL (ref 8.9–10.3)
CO2: 23 mmol/L (ref 22–32)
Chloride: 103 mmol/L (ref 101–111)
Creatinine, Ser: 0.8 mg/dL (ref 0.44–1.00)
GFR calc Af Amer: >60 mL/min (ref >60)
GFR calc non Af Amer: >60 mL/min (ref >60)
GLUCOSE: 249 mg/dL — AB (ref 65–99)
Potassium: 3.7 mmol/L (ref 3.5–5.1)
Sodium: 137 mmol/L (ref 135–145)
TOTAL PROTEIN: 7.1 g/dL (ref 6.5–8.1)

## 2017-11-29 MED ORDER — BORTEZOMIB CHEMO SQ INJECTION 3.5 MG (2.5MG/ML)
1.3000 mg/m2 | Freq: Once | INTRAMUSCULAR | Status: AC
  Administered 2017-11-29: 3 mg via SUBCUTANEOUS
  Filled 2017-11-29: qty 3

## 2017-11-29 MED ORDER — PROCHLORPERAZINE MALEATE 10 MG PO TABS
10.0000 mg | ORAL_TABLET | Freq: Once | ORAL | Status: AC
  Administered 2017-11-29: 10 mg via ORAL
  Filled 2017-11-29: qty 1

## 2017-11-29 NOTE — Progress Notes (Signed)
To treatment room for velcade shot.  Patient taking calcium as directed and denied neuropathy.  Stated more energy level and no problems with constipation.  Family at side.

## 2017-11-29 NOTE — Progress Notes (Signed)
Joanna Reid presents today for injection per MD orders. Velcade 3mg  administered SQ in right Abdomen. Administration without incident. Patient tolerated well.  Treatment given per orders. Patient tolerated it well without problems. Vitals stable and discharged home from clinic ambulatory. Follow up as scheduled.

## 2017-11-29 NOTE — Addendum Note (Signed)
Addended by: Donnie Aho on: 11/29/2017 03:02 PM   Modules accepted: Orders

## 2017-11-29 NOTE — Patient Instructions (Signed)
[facility] Discharge Instructions for Patients Receiving Chemotherapy  Today you received the following chemotherapy agents velcade.    If you develop nausea and vomiting that is not controlled by your nausea medication, call the clinic.   BELOW ARE SYMPTOMS THAT SHOULD BE REPORTED IMMEDIATELY:  *FEVER GREATER THAN 100.5 F  *CHILLS WITH OR WITHOUT FEVER  NAUSEA AND VOMITING THAT IS NOT CONTROLLED WITH YOUR NAUSEA MEDICATION  *UNUSUAL SHORTNESS OF BREATH  *UNUSUAL BRUISING OR BLEEDING  TENDERNESS IN MOUTH AND THROAT WITH OR WITHOUT PRESENCE OF ULCERS  *URINARY PROBLEMS  *BOWEL PROBLEMS  UNUSUAL RASH Items with * indicate a potential emergency and should be followed up as soon as possible.  Feel free to call the clinic should you have any questions or concerns. The clinic phone number is [phone].  Please show the CHEMO ALERT CARD at check-in to the Emergency Department and triage nurse.   

## 2017-12-03 ENCOUNTER — Inpatient Hospital Stay (HOSPITAL_COMMUNITY): Payer: BLUE CROSS/BLUE SHIELD

## 2017-12-03 ENCOUNTER — Other Ambulatory Visit (HOSPITAL_COMMUNITY): Payer: Self-pay | Admitting: Hematology

## 2017-12-03 ENCOUNTER — Encounter (HOSPITAL_COMMUNITY): Payer: Self-pay

## 2017-12-03 ENCOUNTER — Other Ambulatory Visit: Payer: Self-pay

## 2017-12-03 ENCOUNTER — Inpatient Hospital Stay (HOSPITAL_COMMUNITY): Payer: BLUE CROSS/BLUE SHIELD | Attending: Hematology

## 2017-12-03 VITALS — BP 151/91 | HR 91 | Temp 98.1°F | Resp 18 | Wt 264.0 lb

## 2017-12-03 DIAGNOSIS — C9000 Multiple myeloma not having achieved remission: Secondary | ICD-10-CM

## 2017-12-03 DIAGNOSIS — Z5112 Encounter for antineoplastic immunotherapy: Secondary | ICD-10-CM | POA: Diagnosis not present

## 2017-12-03 LAB — LACTATE DEHYDROGENASE: LDH: 197 U/L — AB (ref 98–192)

## 2017-12-03 MED ORDER — BORTEZOMIB CHEMO SQ INJECTION 3.5 MG (2.5MG/ML)
1.3000 mg/m2 | Freq: Once | INTRAMUSCULAR | Status: AC
  Administered 2017-12-03: 3 mg via SUBCUTANEOUS
  Filled 2017-12-03: qty 3

## 2017-12-03 MED ORDER — PROCHLORPERAZINE MALEATE 10 MG PO TABS: 10.0000 mg | ORAL_TABLET | Freq: Once | ORAL | Status: DC

## 2017-12-03 MED ORDER — PROCHLORPERAZINE MALEATE 10 MG PO TABS
ORAL_TABLET | ORAL | Status: AC
  Filled 2017-12-03: qty 1

## 2017-12-03 NOTE — Progress Notes (Signed)
Joanna Reid presents today for injection per the provider's orders.  Velcade administration without incident; see MAR for injection details.  Patient tolerated procedure well and without incident.  No questions or complaints noted at this time.  Discharged ambulatory in c/o family.  

## 2017-12-04 LAB — IGG, IGA, IGM
IGA: 67 mg/dL — AB (ref 87–352)
IgG (Immunoglobin G), Serum: 638 mg/dL — ABNORMAL LOW (ref 700–1600)
IgM (Immunoglobulin M), Srm: 53 mg/dL (ref 26–217)

## 2017-12-04 LAB — KAPPA/LAMBDA LIGHT CHAINS
KAPPA FREE LGHT CHN: 14.6 mg/L (ref 3.3–19.4)
KAPPA, LAMDA LIGHT CHAIN RATIO: 0.15 — AB (ref 0.26–1.65)
Lambda free light chains: 100.1 mg/L — ABNORMAL HIGH (ref 5.7–26.3)

## 2017-12-04 LAB — BETA 2 MICROGLOBULIN, SERUM: Beta-2 Microglobulin: 2.2 mg/L (ref 0.6–2.4)

## 2017-12-05 ENCOUNTER — Other Ambulatory Visit (HOSPITAL_COMMUNITY): Payer: Self-pay

## 2017-12-05 DIAGNOSIS — C9000 Multiple myeloma not having achieved remission: Secondary | ICD-10-CM

## 2017-12-05 MED ORDER — LENALIDOMIDE 25 MG PO CAPS: ORAL_CAPSULE | ORAL | 3 refills | Status: DC

## 2017-12-05 NOTE — Telephone Encounter (Signed)
Received refill request from patients pharmacy for Revlimid. Reviewed with provider, chart checked and refilled.  

## 2017-12-06 LAB — IMMUNOFIXATION ELECTROPHORESIS
IGA: 65 mg/dL — AB (ref 87–352)
IGM (IMMUNOGLOBULIN M), SRM: 59 mg/dL (ref 26–217)
IgG (Immunoglobin G), Serum: 653 mg/dL — ABNORMAL LOW (ref 700–1600)
Total Protein ELP: 6 g/dL (ref 6.0–8.5)

## 2017-12-13 ENCOUNTER — Encounter (HOSPITAL_COMMUNITY): Payer: Self-pay | Admitting: Hematology

## 2017-12-13 ENCOUNTER — Inpatient Hospital Stay (HOSPITAL_BASED_OUTPATIENT_CLINIC_OR_DEPARTMENT_OTHER): Payer: BLUE CROSS/BLUE SHIELD | Admitting: Hematology

## 2017-12-13 ENCOUNTER — Inpatient Hospital Stay (HOSPITAL_COMMUNITY): Payer: BLUE CROSS/BLUE SHIELD | Attending: Hematology

## 2017-12-13 ENCOUNTER — Inpatient Hospital Stay (HOSPITAL_COMMUNITY): Payer: BLUE CROSS/BLUE SHIELD

## 2017-12-13 ENCOUNTER — Other Ambulatory Visit: Payer: Self-pay

## 2017-12-13 VITALS — BP 168/90 | HR 91 | Temp 98.2°F | Resp 16 | Wt 267.1 lb

## 2017-12-13 DIAGNOSIS — C9000 Multiple myeloma not having achieved remission: Secondary | ICD-10-CM

## 2017-12-13 DIAGNOSIS — M545 Low back pain: Secondary | ICD-10-CM

## 2017-12-13 DIAGNOSIS — Z5112 Encounter for antineoplastic immunotherapy: Secondary | ICD-10-CM | POA: Diagnosis not present

## 2017-12-13 LAB — CBC WITH DIFFERENTIAL/PLATELET
BASOS ABS: 0 10*3/uL (ref 0.0–0.1)
BASOS PCT: 0 %
EOS ABS: 0.2 10*3/uL (ref 0.0–0.7)
EOS PCT: 4 %
HEMATOCRIT: 30.2 % — AB (ref 36.0–46.0)
Hemoglobin: 9.9 g/dL — ABNORMAL LOW (ref 12.0–15.0)
Lymphocytes Relative: 25 %
Lymphs Abs: 1 10*3/uL (ref 0.7–4.0)
MCH: 31.4 pg (ref 26.0–34.0)
MCHC: 32.8 g/dL (ref 30.0–36.0)
MCV: 95.9 fL (ref 78.0–100.0)
MONO ABS: 0.5 10*3/uL (ref 0.1–1.0)
Monocytes Relative: 13 %
NEUTROS ABS: 2.2 10*3/uL (ref 1.7–7.7)
Neutrophils Relative %: 58 %
PLATELETS: 243 10*3/uL (ref 150–400)
RBC: 3.15 MIL/uL — ABNORMAL LOW (ref 3.87–5.11)
RDW: 15.8 % — AB (ref 11.5–15.5)
WBC: 3.8 10*3/uL — ABNORMAL LOW (ref 4.0–10.5)

## 2017-12-13 LAB — COMPREHENSIVE METABOLIC PANEL
ALT: 50 U/L (ref 14–54)
ANION GAP: 12 (ref 5–15)
AST: 51 U/L — AB (ref 15–41)
Albumin: 3.8 g/dL (ref 3.5–5.0)
Alkaline Phosphatase: 218 U/L — ABNORMAL HIGH (ref 38–126)
BUN: 13 mg/dL (ref 6–20)
CHLORIDE: 101 mmol/L (ref 101–111)
CO2: 27 mmol/L (ref 22–32)
Calcium: 8.5 mg/dL — ABNORMAL LOW (ref 8.9–10.3)
Creatinine, Ser: 0.82 mg/dL (ref 0.44–1.00)
GFR calc Af Amer: >60 mL/min (ref >60)
GFR calc non Af Amer: >60 mL/min (ref >60)
GLUCOSE: 263 mg/dL — AB (ref 65–99)
POTASSIUM: 3.4 mmol/L — AB (ref 3.5–5.1)
SODIUM: 140 mmol/L (ref 135–145)
Total Bilirubin: 1.1 mg/dL (ref 0.3–1.2)
Total Protein: 6.6 g/dL (ref 6.5–8.1)

## 2017-12-13 MED ORDER — BORTEZOMIB CHEMO SQ INJECTION 3.5 MG (2.5MG/ML)
1.3000 mg/m2 | Freq: Once | INTRAMUSCULAR | Status: AC
  Administered 2017-12-13: 3 mg via SUBCUTANEOUS
  Filled 2017-12-13: qty 1.2

## 2017-12-13 MED ORDER — PROCHLORPERAZINE MALEATE 10 MG PO TABS: 10.0000 mg | ORAL_TABLET | Freq: Once | ORAL | Status: DC

## 2017-12-13 NOTE — Patient Instructions (Signed)
[place] at [facility]  Discharge Instructions:  You received your velcade shot today.  _______________________________________________________________  Thank you for choosing [place] at [facility] to provide your oncology and hematology care.  To afford each patient quality time with our providers, please arrive at least 15 minutes before your scheduled appointment.  You need to re-schedule your appointment if you arrive 10 or more minutes late.  We strive to give you quality time with our providers, and arriving late affects you and other patients whose appointments are after yours.  Also, if you no show three or more times for appointments you may be dismissed from the clinic.  Again, thank you for choosing [place] at [place] hope is that these requests will allow you access to exceptional care and in a timely manner. _______________________________________________________________  If you have questions after your visit, please contact our office at [phone] between the hours of 8:30 a.m. and 5:00 p.m. Voicemails left after 4:30 p.m. will not be returned until the following business day. _______________________________________________________________  For prescription refill requests, have your pharmacy contact our office. _______________________________________________________________  Recommendations made by the consultant and any test results will be sent to your referring physician. _______________________________________________________________

## 2017-12-13 NOTE — Progress Notes (Signed)
Patient tolerated velcade shot with no complaints voiced.  Injection site clean and dry with no bruising or swelling noted at site.  Band aid applied.  VSS with discharge and left ambulatory with no s/s of distress noted.   

## 2017-12-13 NOTE — Assessment & Plan Note (Signed)
1.  IgG lambda multiple myeloma, stage III, del 17p: - Presentation with lower back pain since March, presented to the emergency room on 10/25/2017 with right groin pain, CT scan showed multiple lytic lesions, multiple lytic lesions on skeletal survey - SPEP shows 1.5 g/dL of monoclonal protein, beta-2 microglobulin of 4.8, free lambda light chains of 2869, kappa by lambda ratio of less than 0, elevated LDH -Chromosome analysis showed no metaphase chromosomes.  FISH panel shows -14, -12, 13 q.-/-13 and 17p-.  Bone marrow biopsy shows 90% plasma cells. -Cycle 1 of RVD started on 11/01/2017, received IV Cytoxan in the first week secondary to delay of getting Revlimid finished her 14 days of Revlimid on 11/20/2017.  She tolerated it very well.  Cycle 2 was started on 11/22/2017 with Velcade on days 1, 4, 8, 11 every 21 days.  She does not have any signs of neuropathy.  I have discussed the results of free light chain ratio which has improved to 0.15 (previously 0.00), lambda light chains improved to 100 (previously 2869).  SPEP is pending from today.  She has an appointment to see Dr. Laverta [place] at [facility] for transplant consultation.  She will proceed with cycle 3 Velcade today.  She will start her Revlimid on 12/19/2017.  2.  Low back pain: She is taking hydrocodone as needed.  Pain has improved since the start of therapy for multiple myeloma.  She has taken only 1 pill in the last 3 weeks.  3.  Hypercalcemia: Her calcium was elevated at 10.8 on 10/29/2017.  She received fluids and Zometa.  This is no longer a problem.  We plan to continue Zometa once every 4 weeks.  4.  ID prophylaxis: She will continue acyclovir twice daily.  She will also continue aspirin 81 mg daily for DVT prophylaxis.

## 2017-12-13 NOTE — Progress Notes (Signed)
[place], [place] [address]   CLINIC:  Medical Oncology/Hematology  PCP:  Mikey Kirschner, [place] [address] [phone]   REASON FOR VISIT:  Follow-up for multiple myeloma.  CURRENT THERAPY: RVD q. 21 days.  BRIEF ONCOLOGIC HISTORY:    Multiple myeloma ([facility])   10/29/2017 Initial Diagnosis    Multiple myeloma ([facility])      11/01/2017 -  Chemotherapy    The patient had bortezomib SQ (VELCADE) chemo injection 3 mg, 1.3 mg/m2 = 3 mg, Subcutaneous,  Once, 3 of 4 cycles Administration: 3 mg (11/01/2017), 3 mg (11/08/2017), 3 mg (11/05/2017), 3 mg (11/22/2017), 3 mg (11/12/2017), 3 mg (11/29/2017), 3 mg (11/26/2017), 3 mg (12/03/2017), 3 mg (12/13/2017)  for chemotherapy treatment.         CANCER STAGING: Cancer Staging Multiple myeloma ([facility]) Staging form: Plasma Cell Myeloma and Plasma Cell Disorders, AJCC 8th Edition - Clinical: No stage assigned - Unsigned    INTERVAL HISTORY:  Ms. Mckesson 52 y.o. female returns for follow-up of multiple myeloma.  She is in her off week of Revlimid.  She will start back Revlimid next Thursday.  She is taking dexamethasone 40 mg weekly.  She denies any diarrhea or constipation.  She denies any tingling or numbness next remedies.  No fevers or infections were reported.  Her back pain has improved tremendously.  She has not required any hydrocodone in the last 3 weeks.  Overall she is very happy with the results.  She has some very mild back pain on exertion for which she takes NSAIDs.  REVIEW OF SYSTEMS:  Review of Systems  Constitutional: Positive for fatigue.  Musculoskeletal: Positive for back pain.  All other systems reviewed and are negative.    PAST MEDICAL/SURGICAL HISTORY:  Past Medical History:  Diagnosis Date  . Acid reflux   . Allergic rhinitis   . Diabetes mellitus    type 2  . Gout   . Gout   . HBP (high blood pressure)   . History of kidney stones   .  Migraines    Past Surgical History:  Procedure Laterality Date  . CESAREAN SECTION    . EXTRACORPOREAL SHOCK WAVE LITHOTRIPSY Left 10/10/2017   Procedure: LEFT EXTRACORPOREAL SHOCK WAVE LITHOTRIPSY (ESWL);  Surgeon: Bjorn Loser, MD;  Location: [facility];  Service: Urology;  Laterality: Left;  . EYE SURGERY    . HEMORRHOID SURGERY N/A 11/19/2012   Procedure: HEMORRHOIDECTOMY;  Surgeon: Jamesetta So, MD;  Location: [facility];  Service: General;  Laterality: N/A;  . kidney stones  1998  . LAPAROSCOPIC UNILATERAL SALPINGO OOPHERECTOMY  05/14/2012   Procedure: LAPAROSCOPIC UNILATERAL SALPINGO OOPHORECTOMY;  Surgeon: [name], MD;  Location: [facility];  Service: Gynecology;  Laterality: Right;  laparoscopic right salpingo-oophorectomy  . PARTIAL HYSTERECTOMY    . TONSILECTOMY, ADENOIDECTOMY, BILATERAL MYRINGOTOMY AND TUBES    . VESICOVAGINAL FISTULA CLOSURE W/ TAH       SOCIAL HISTORY:  Social History   Socioeconomic History  . Marital status: Married    Spouse name: Not on file  . Number of children: Not on file  . Years of education: 32  . Highest education level: Not on file  Occupational History    Employer: [employer]  . Financial resource strain: Not on file  . Food insecurity:    Worry: Never true    Inability: Never true  . Transportation needs:  Medical: Not on file    Non-medical: Not on file  Tobacco Use  . Smoking status: Former Smoker    Last attempt to quit: 02/27/1999    Years since quitting: 18.8  . Smokeless tobacco: Never Used  . Tobacco comment: socially   Substance and Sexual Activity  . Alcohol use: No    Alcohol/week: 0.0 oz  . Drug use: No  . Sexual activity: Yes  Lifestyle  . Physical activity:    Days per week: Not on file    Minutes per session: Not on file  . Stress: Not on file  Relationships  . Social connections:    Talks on phone: Not on file    Gets together: Not on file    Attends religious service: Not on file    Active  member of club or organization: Not on file    Attends meetings of clubs or organizations: Not on file    Relationship status: Not on file  . Intimate partner violence:    Fear of current or ex partner: Not on file    Emotionally abused: Not on file    Physically abused: Not on file    Forced sexual activity: Not on file  Other Topics Concern  . Not on file  Social History Narrative  . Not on file    FAMILY HISTORY:  Family History  Problem Relation Age of Onset  . Arthritis Unknown   . Cancer Unknown   . Diabetes Unknown   . Hypertension Mother   . Dementia Mother   . Diabetes Father   . ALS Father   . Diabetes Brother   . Hypertension Brother   . Cancer Paternal Aunt   . COPD Maternal Grandmother   . Cancer Maternal Grandfather   . Anesthesia problems Paternal Grandfather     CURRENT MEDICATIONS:  Outpatient Encounter Medications as of 12/13/2017  Medication Sig Note  . acyclovir (ZOVIRAX) 400 MG tablet Take 1 tablet (400 mg total) by mouth 2 (two) times daily.   Marland Kitchen albuterol (PROVENTIL HFA;VENTOLIN HFA) 108 (90 Base) MCG/ACT inhaler Inhale 1-2 puffs into the lungs daily as needed.   Marland Kitchen allopurinol (ZYLOPRIM) 300 MG tablet TAKE 1 TABLET BY MOUTH  DAILY FOR GOUT   . aspirin EC 81 MG tablet Take 81 mg by mouth daily.   . bumetanide (BUMEX) 1 MG tablet Take 1 tablet (1 mg total) by mouth daily.   . Calcium Carb-Cholecalciferol (CALCIUM 600+D3 PO) Take by mouth. 11/08/2017: Calcium 651m with Vitamin D 1,000 units BID  . dexamethasone (DECADRON) 4 MG tablet Take 10 tablets (40 mg total) by mouth once a week.   . diazepam (VALIUM) 5 MG tablet Take one qhs prn anxiety or insomnia.   . fluticasone (FLONASE) 50 MCG/ACT nasal spray Place 2 sprays into the nose 2 (two) times daily.   .Marland Kitchengabapentin (NEURONTIN) 300 MG capsule    . HYDROcodone-acetaminophen (NORCO/VICODIN) 5-325 MG tablet Take 1 tablet by mouth every 6 (six) hours as needed for moderate pain.   . Lactulose 20 GM/30ML SOLN  Take 325mby mouth every 3-4 hours until bowel movement. Then take 3038my mouth daily.   . lMarland Kitchennalidomide (REVLIMID) 25 MG capsule Take 1 capsule by mouth daily x 14 days in a row followed by a 7 day rest period. Then resume regimen.   . lMarland Kitchensartan (COZAAR) 100 MG tablet Take 1 tablet (100 mg total) by mouth daily.   . meloxicam (MOBIC) 15 MG tablet TAKE 1  TABLET BY MOUTH ONCE DAILY   . metFORMIN (GLUCOPHAGE) 500 MG tablet TAKE 1 TABLET BY MOUTH ONCE DAILY WITH BREAKFAST   . nitrofurantoin, macrocrystal-monohydrate, (MACROBID) 100 MG capsule Take 100 mg by mouth daily.    Marland Kitchen omeprazole (PRILOSEC) 20 MG capsule TAKE 1 CAPSULE BY MOUTH  DAILY   . ondansetron (ZOFRAN) 8 MG tablet Take 1 tablet (8 mg total) by mouth every 8 (eight) hours as needed.   . polyethylene glycol powder (GLYCOLAX/MIRALAX) powder Take 1 Container by mouth daily.   . prochlorperazine (COMPAZINE) 10 MG tablet Take 1 tablet (10 mg total) by mouth every 6 (six) hours as needed for nausea or vomiting.   Marland Kitchen tiZANidine (ZANAFLEX) 4 MG capsule Take 4 mg by mouth every 8 (eight) hours.   . valACYclovir (VALTREX) 1000 MG tablet Take 2 tablets now and 2 tablets 12 hours later   . verapamil (CALAN-SR) 240 MG CR tablet TAKE 1 TABLET BY MOUTH AT  BEDTIME    No facility-administered encounter medications on file as of 12/13/2017.     ALLERGIES:  No Known Allergies   PHYSICAL EXAM:  ECOG Performance status: 1  Vitals:   12/13/17 1018  BP: (!) 168/90  Pulse: 91  Resp: 16  Temp: 98.2 F (36.8 C)  SpO2: 99%   Filed Weights   12/13/17 1018  Weight: 267 lb 1.6 oz (121.2 kg)    Physical Exam Deferred.  LABORATORY DATA:  I have reviewed the labs as listed.  CBC    Component Value Date/Time   WBC 3.8 (L) 12/13/2017 0857   RBC 3.15 (L) 12/13/2017 0857   HGB 9.9 (L) 12/13/2017 0857   HGB 14.2 08/02/2015 1113   HCT 30.2 (L) 12/13/2017 0857   HCT 41.1 08/02/2015 1113   PLT 243 12/13/2017 0857   PLT 371 08/02/2015 1113   MCV  95.9 12/13/2017 0857   MCV 84 08/02/2015 1113   MCH 31.4 12/13/2017 0857   MCHC 32.8 12/13/2017 0857   RDW 15.8 (H) 12/13/2017 0857   RDW 15.1 08/02/2015 1113   LYMPHSABS 1.0 12/13/2017 0857   LYMPHSABS 2.9 08/02/2015 1113   MONOABS 0.5 12/13/2017 0857   EOSABS 0.2 12/13/2017 0857   EOSABS 0.2 08/02/2015 1113   BASOSABS 0.0 12/13/2017 0857   BASOSABS 0.1 08/02/2015 1113   CMP Latest Ref Rng & Units 12/13/2017 11/29/2017 11/22/2017  Glucose 65 - 99 mg/dL 263(H) 249(H) 225(H)  BUN 6 - 20 mg/dL 13 15 18   Creatinine 0.44 - 1.00 mg/dL 0.82 0.80 0.79  Sodium 135 - 145 mmol/L 140 137 137  Potassium 3.5 - 5.1 mmol/L 3.4(L) 3.7 3.2(L)  Chloride 101 - 111 mmol/L 101 103 99(L)  CO2 22 - 32 mmol/L 27 23 25   Calcium 8.9 - 10.3 mg/dL 8.5(L) 9.2 9.1  Total Protein 6.5 - 8.1 g/dL 6.6 7.1 7.1  Total Bilirubin 0.3 - 1.2 mg/dL 1.1 1.0 0.9  Alkaline Phos 38 - 126 U/L 218(H) 181(H) 120  AST 15 - 41 U/L 51(H) 60(H) 62(H)  ALT 14 - 54 U/L 50 55(H) 69(H)       ASSESSMENT & PLAN:   Multiple myeloma ([facility]) 1.  IgG lambda multiple myeloma, stage III, del 17p: - Presentation with lower back pain since March, presented to the emergency room on 10/25/2017 with right groin pain, CT scan showed multiple lytic lesions, multiple lytic lesions on skeletal survey - SPEP shows 1.5 g/dL of monoclonal protein, beta-2 microglobulin of 4.8, free lambda light chains of 2869, kappa  by lambda ratio of less than 0, elevated LDH -Chromosome analysis showed no metaphase chromosomes.  FISH panel shows -14, -12, 13 q.-/-13 and 17p-.  Bone marrow biopsy shows 90% plasma cells. -Cycle 1 of RVD started on 11/01/2017, received IV Cytoxan in the first week secondary to delay of getting Revlimid finished her 14 days of Revlimid on 11/20/2017.  She tolerated it very well.  Cycle 2 was started on 11/22/2017 with Velcade on days 1, 4, 8, 11 every 21 days.  She does not have any signs of neuropathy.  I have discussed the results of free light  chain ratio which has improved to 0.15 (previously 0.00), lambda light chains improved to 100 (previously 2869).  SPEP is pending from today.  She has an appointment to see Dr. Laverta Baltimore at [facility] for transplant consultation.  She will proceed with cycle 3 Velcade today.  She will start her Revlimid on 12/19/2017.  2.  Low back pain: She is taking hydrocodone as needed.  Pain has improved since the start of therapy for multiple myeloma.  She has taken only 1 pill in the last 3 weeks.  3.  Hypercalcemia: Her calcium was elevated at 10.8 on 10/29/2017.  She received fluids and Zometa.  This is no longer a problem.  We plan to continue Zometa once every 4 weeks.  4.  ID prophylaxis: She will continue acyclovir twice daily.  She will also continue aspirin 81 mg daily for DVT prophylaxis.      Orders placed this encounter:  Orders Placed This Encounter  Procedures  . Protein electrophoresis, serum  . Protein electrophoresis, serum  . Kappa/lambda light chains  . Comprehensive metabolic panel  . CBC with Differential      Derek Jack, MD [place] [phone]

## 2017-12-16 DIAGNOSIS — C9000 Multiple myeloma not having achieved remission: Secondary | ICD-10-CM | POA: Diagnosis not present

## 2017-12-16 LAB — PROTEIN ELECTROPHORESIS, SERUM
A/G RATIO SPE: 1.4 (ref 0.7–1.7)
ALBUMIN ELP: 3.8 g/dL (ref 2.9–4.4)
Alpha-1-Globulin: 0.3 g/dL (ref 0.0–0.4)
Alpha-2-Globulin: 0.8 g/dL (ref 0.4–1.0)
Beta Globulin: 1.1 g/dL (ref 0.7–1.3)
Gamma Globulin: 0.6 g/dL (ref 0.4–1.8)
Globulin, Total: 2.8 g/dL (ref 2.2–3.9)
M-Spike, %: 0.2 g/dL — ABNORMAL HIGH
TOTAL PROTEIN ELP: 6.6 g/dL (ref 6.0–8.5)

## 2017-12-17 ENCOUNTER — Inpatient Hospital Stay (HOSPITAL_COMMUNITY): Payer: BLUE CROSS/BLUE SHIELD

## 2017-12-17 ENCOUNTER — Other Ambulatory Visit: Payer: Self-pay

## 2017-12-17 ENCOUNTER — Encounter (HOSPITAL_COMMUNITY): Payer: Self-pay

## 2017-12-17 VITALS — BP 176/104 | HR 88 | Temp 98.2°F | Resp 20 | Wt 267.4 lb

## 2017-12-17 DIAGNOSIS — C9000 Multiple myeloma not having achieved remission: Secondary | ICD-10-CM

## 2017-12-17 DIAGNOSIS — Z5112 Encounter for antineoplastic immunotherapy: Secondary | ICD-10-CM | POA: Diagnosis not present

## 2017-12-17 MED ORDER — PROCHLORPERAZINE MALEATE 10 MG PO TABS: 10.0000 mg | ORAL_TABLET | Freq: Once | ORAL | Status: DC

## 2017-12-17 MED ORDER — BORTEZOMIB CHEMO SQ INJECTION 3.5 MG (2.5MG/ML)
1.3000 mg/m2 | Freq: Once | INTRAMUSCULAR | Status: AC
  Administered 2017-12-17: 3 mg via SUBCUTANEOUS
  Filled 2017-12-17: qty 1.2

## 2017-12-17 NOTE — Progress Notes (Signed)
Treatment given per orders. Patient tolerated it well without problems. Vitals stable and discharged home from clinic ambulatory. Follow up as scheduled.  

## 2017-12-17 NOTE — Patient Instructions (Signed)
[facility] Discharge Instructions for Patients Receiving Chemotherapy   Beginning January 23rd 2017 lab work for the Cancer Center will be done in the  Main lab at [facility] on 1st floor. If you have a lab appointment with the Cancer Center please come in thru the  Main Entrance and check in at the main information desk   Today you received the following chemotherapy agents   To help prevent nausea and vomiting after your treatment, we encourage you to take your nausea medication     If you develop nausea and vomiting, or diarrhea that is not controlled by your medication, call the clinic.  The clinic phone number is [phone]. Office hours are Monday-Friday 8:30am-5:00pm.  BELOW ARE SYMPTOMS THAT SHOULD BE REPORTED IMMEDIATELY:  *FEVER GREATER THAN 101.0 F  *CHILLS WITH OR WITHOUT FEVER  NAUSEA AND VOMITING THAT IS NOT CONTROLLED WITH YOUR NAUSEA MEDICATION  *UNUSUAL SHORTNESS OF BREATH  *UNUSUAL BRUISING OR BLEEDING  TENDERNESS IN MOUTH AND THROAT WITH OR WITHOUT PRESENCE OF ULCERS  *URINARY PROBLEMS  *BOWEL PROBLEMS  UNUSUAL RASH Items with * indicate a potential emergency and should be followed up as soon as possible. If you have an emergency after office hours please contact your primary care physician or go to the nearest emergency department.  Please call the clinic during office hours if you have any questions or concerns.   You may also contact the Patient Navigator at [phone] should you have any questions or need assistance in obtaining follow up care.      Resources For Cancer Patients and their Caregivers ? American Cancer Society: Can assist with transportation, wigs, general needs, runs Look Good Feel Better.        1-888-227-6333 ? Cancer Care: Provides financial assistance, online support groups, medication/co-pay assistance.  1-800-813-HOPE (4673) ? [facility] Assists [place] cancer  patients and their families through emotional , educational and financial support.  336-427-4357 ? [place] DSS Where to apply for food stamps, Medicaid and utility assistance. 336-342-1394 ? [facility]: Transportation to medical appointments. 336-347-2287 ? Social Security Administration: May apply for disability if have a Stage IV cancer. 336-342-7796 1-800-772-1213 ? [place] Aging, Disability and Transit Services: Assists with nutrition, care and transit needs. 336-349-2343         

## 2017-12-20 ENCOUNTER — Other Ambulatory Visit: Payer: Self-pay

## 2017-12-20 ENCOUNTER — Inpatient Hospital Stay (HOSPITAL_COMMUNITY): Payer: BLUE CROSS/BLUE SHIELD

## 2017-12-20 ENCOUNTER — Encounter (HOSPITAL_COMMUNITY): Payer: Self-pay

## 2017-12-20 VITALS — BP 156/102 | HR 93 | Resp 20

## 2017-12-20 DIAGNOSIS — C9000 Multiple myeloma not having achieved remission: Secondary | ICD-10-CM

## 2017-12-20 DIAGNOSIS — Z5112 Encounter for antineoplastic immunotherapy: Secondary | ICD-10-CM | POA: Diagnosis not present

## 2017-12-20 LAB — CBC WITH DIFFERENTIAL/PLATELET
BASOS ABS: 0 10*3/uL (ref 0.0–0.1)
Basophils Relative: 0 %
Eosinophils Absolute: 0 10*3/uL (ref 0.0–0.7)
Eosinophils Relative: 0 %
HEMATOCRIT: 32.2 % — AB (ref 36.0–46.0)
Hemoglobin: 10.6 g/dL — ABNORMAL LOW (ref 12.0–15.0)
LYMPHS ABS: 0.7 10*3/uL (ref 0.7–4.0)
LYMPHS PCT: 19 %
MCH: 31.3 pg (ref 26.0–34.0)
MCHC: 32.9 g/dL (ref 30.0–36.0)
MCV: 95 fL (ref 78.0–100.0)
MONO ABS: 0.2 10*3/uL (ref 0.1–1.0)
Monocytes Relative: 5 %
NEUTROS ABS: 2.8 10*3/uL (ref 1.7–7.7)
Neutrophils Relative %: 76 %
Platelets: 189 10*3/uL (ref 150–400)
RBC: 3.39 MIL/uL — ABNORMAL LOW (ref 3.87–5.11)
RDW: 15.9 % — AB (ref 11.5–15.5)
WBC: 3.8 10*3/uL — ABNORMAL LOW (ref 4.0–10.5)

## 2017-12-20 LAB — COMPREHENSIVE METABOLIC PANEL
ALT: 49 U/L (ref 14–54)
AST: 63 U/L — AB (ref 15–41)
Albumin: 3.9 g/dL (ref 3.5–5.0)
Alkaline Phosphatase: 192 U/L — ABNORMAL HIGH (ref 38–126)
Anion gap: 11 (ref 5–15)
BILIRUBIN TOTAL: 0.8 mg/dL (ref 0.3–1.2)
BUN: 16 mg/dL (ref 6–20)
CO2: 23 mmol/L (ref 22–32)
CREATININE: 0.87 mg/dL (ref 0.44–1.00)
Calcium: 9.5 mg/dL (ref 8.9–10.3)
Chloride: 104 mmol/L (ref 101–111)
GFR calc Af Amer: >60 mL/min (ref >60)
Glucose, Bld: 282 mg/dL — ABNORMAL HIGH (ref 65–99)
POTASSIUM: 3.5 mmol/L (ref 3.5–5.1)
Sodium: 138 mmol/L (ref 135–145)
TOTAL PROTEIN: 7 g/dL (ref 6.5–8.1)

## 2017-12-20 MED ORDER — PROCHLORPERAZINE MALEATE 10 MG PO TABS: 10.0000 mg | ORAL_TABLET | Freq: Once | ORAL | Status: DC

## 2017-12-20 MED ORDER — BORTEZOMIB CHEMO SQ INJECTION 3.5 MG (2.5MG/ML)
1.3000 mg/m2 | Freq: Once | INTRAMUSCULAR | Status: AC
  Administered 2017-12-20: 3 mg via SUBCUTANEOUS
  Filled 2017-12-20: qty 1.2

## 2017-12-20 NOTE — Progress Notes (Signed)
Labs reviewed with Dr. Raliegh Ip, proceed with velcade today.  Treatment given per orders. Patient tolerated it well without problems. Vitals stable and discharged home from clinic ambulatory. Follow up as scheduled.

## 2017-12-20 NOTE — Patient Instructions (Signed)
[facility] Discharge Instructions for Patients Receiving Chemotherapy   Beginning January 23rd 2017 lab work for the Cancer Center will be done in the  Main lab at [facility] on 1st floor. If you have a lab appointment with the Cancer Center please come in thru the  Main Entrance and check in at the main information desk   Today you received the following chemotherapy agents   To help prevent nausea and vomiting after your treatment, we encourage you to take your nausea medication     If you develop nausea and vomiting, or diarrhea that is not controlled by your medication, call the clinic.  The clinic phone number is [phone]. Office hours are Monday-Friday 8:30am-5:00pm.  BELOW ARE SYMPTOMS THAT SHOULD BE REPORTED IMMEDIATELY:  *FEVER GREATER THAN 101.0 F  *CHILLS WITH OR WITHOUT FEVER  NAUSEA AND VOMITING THAT IS NOT CONTROLLED WITH YOUR NAUSEA MEDICATION  *UNUSUAL SHORTNESS OF BREATH  *UNUSUAL BRUISING OR BLEEDING  TENDERNESS IN MOUTH AND THROAT WITH OR WITHOUT PRESENCE OF ULCERS  *URINARY PROBLEMS  *BOWEL PROBLEMS  UNUSUAL RASH Items with * indicate a potential emergency and should be followed up as soon as possible. If you have an emergency after office hours please contact your primary care physician or go to the nearest emergency department.  Please call the clinic during office hours if you have any questions or concerns.   You may also contact the Patient Navigator at [phone] should you have any questions or need assistance in obtaining follow up care.      Resources For Cancer Patients and their Caregivers ? American Cancer Society: Can assist with transportation, wigs, general needs, runs Look Good Feel Better.        1-888-227-6333 ? Cancer Care: Provides financial assistance, online support groups, medication/co-pay assistance.  1-800-813-HOPE (4673) ? [facility] Assists [place] cancer  patients and their families through emotional , educational and financial support.  336-427-4357 ? [place] DSS Where to apply for food stamps, Medicaid and utility assistance. 336-342-1394 ? [facility]: Transportation to medical appointments. 336-347-2287 ? Social Security Administration: May apply for disability if have a Stage IV cancer. 336-342-7796 1-800-772-1213 ? [place] Aging, Disability and Transit Services: Assists with nutrition, care and transit needs. 336-349-2343         

## 2017-12-23 ENCOUNTER — Other Ambulatory Visit (HOSPITAL_COMMUNITY): Payer: Self-pay

## 2017-12-23 DIAGNOSIS — C9000 Multiple myeloma not having achieved remission: Secondary | ICD-10-CM

## 2017-12-23 MED ORDER — DEXAMETHASONE 4 MG PO TABS: 40.0000 mg | ORAL_TABLET | ORAL | 0 refills | Status: DC

## 2017-12-23 NOTE — Telephone Encounter (Signed)
Received refill request from patients pharmacy for dexamethasone. Reviewed with provider, chart checked and refilled.  

## 2017-12-24 ENCOUNTER — Other Ambulatory Visit: Payer: Self-pay

## 2017-12-24 ENCOUNTER — Inpatient Hospital Stay (HOSPITAL_COMMUNITY): Payer: BLUE CROSS/BLUE SHIELD

## 2017-12-24 ENCOUNTER — Encounter (HOSPITAL_COMMUNITY): Payer: Self-pay

## 2017-12-24 VITALS — BP 169/96 | HR 95 | Temp 97.8°F | Resp 18 | Wt 265.7 lb

## 2017-12-24 DIAGNOSIS — C9000 Multiple myeloma not having achieved remission: Secondary | ICD-10-CM | POA: Diagnosis not present

## 2017-12-24 DIAGNOSIS — Z5112 Encounter for antineoplastic immunotherapy: Secondary | ICD-10-CM | POA: Diagnosis not present

## 2017-12-24 MED ORDER — SODIUM CHLORIDE 0.9 % IV SOLN
INTRAVENOUS | Status: DC
  Administered 2017-12-24: 12:00:00 via INTRAVENOUS

## 2017-12-24 MED ORDER — ZOLEDRONIC ACID 4 MG/100ML IV SOLN
4.0000 mg | Freq: Once | INTRAVENOUS | Status: AC
  Administered 2017-12-24: 4 mg via INTRAVENOUS
  Filled 2017-12-24: qty 100

## 2017-12-24 MED ORDER — BORTEZOMIB CHEMO SQ INJECTION 3.5 MG (2.5MG/ML)
1.3000 mg/m2 | Freq: Once | INTRAMUSCULAR | Status: AC
  Administered 2017-12-24: 3 mg via SUBCUTANEOUS
  Filled 2017-12-24: qty 1.2

## 2017-12-24 MED ORDER — PROCHLORPERAZINE MALEATE 10 MG PO TABS
10.0000 mg | ORAL_TABLET | Freq: Once | ORAL | Status: DC
  Filled 2017-12-24: qty 1

## 2017-12-24 NOTE — Progress Notes (Signed)
Joanna Reid presents today for injection and infusion per the provider's orders.  Velcade administration without incident; Zometa infusion w/o incident; see MAR for injection details.  Patient tolerated procedure well and without incident.  No questions or complaints noted at this time. Discharged ambulatory in c/o family.

## 2017-12-30 ENCOUNTER — Encounter: Payer: Self-pay | Admitting: Family Medicine

## 2017-12-30 ENCOUNTER — Ambulatory Visit: Payer: BLUE CROSS/BLUE SHIELD | Admitting: Family Medicine

## 2017-12-30 ENCOUNTER — Inpatient Hospital Stay (HOSPITAL_COMMUNITY): Payer: BLUE CROSS/BLUE SHIELD

## 2017-12-30 VITALS — BP 116/82 | Ht 64.0 in | Wt 268.0 lb

## 2017-12-30 DIAGNOSIS — J029 Acute pharyngitis, unspecified: Secondary | ICD-10-CM | POA: Diagnosis not present

## 2017-12-30 DIAGNOSIS — C9000 Multiple myeloma not having achieved remission: Secondary | ICD-10-CM | POA: Diagnosis not present

## 2017-12-30 DIAGNOSIS — I10 Essential (primary) hypertension: Secondary | ICD-10-CM | POA: Diagnosis not present

## 2017-12-30 DIAGNOSIS — Z5112 Encounter for antineoplastic immunotherapy: Secondary | ICD-10-CM | POA: Diagnosis not present

## 2017-12-30 LAB — COMPREHENSIVE METABOLIC PANEL
ALT: 68 U/L — AB (ref 14–54)
AST: 67 U/L — AB (ref 15–41)
Albumin: 3.7 g/dL (ref 3.5–5.0)
Alkaline Phosphatase: 149 U/L — ABNORMAL HIGH (ref 38–126)
Anion gap: 12 (ref 5–15)
BUN: 17 mg/dL (ref 6–20)
CHLORIDE: 101 mmol/L (ref 101–111)
CO2: 26 mmol/L (ref 22–32)
CREATININE: 0.82 mg/dL (ref 0.44–1.00)
Calcium: 8.4 mg/dL — ABNORMAL LOW (ref 8.9–10.3)
GFR calc Af Amer: >60 mL/min (ref >60)
Glucose, Bld: 271 mg/dL — ABNORMAL HIGH (ref 65–99)
Potassium: 3.5 mmol/L (ref 3.5–5.1)
SODIUM: 139 mmol/L (ref 135–145)
Total Bilirubin: 1 mg/dL (ref 0.3–1.2)
Total Protein: 6.5 g/dL (ref 6.5–8.1)

## 2017-12-30 LAB — CBC WITH DIFFERENTIAL/PLATELET
Basophils Absolute: 0 10*3/uL (ref 0.0–0.1)
Basophils Relative: 0 %
EOS PCT: 5 %
Eosinophils Absolute: 0.2 10*3/uL (ref 0.0–0.7)
HCT: 32.3 % — ABNORMAL LOW (ref 36.0–46.0)
Hemoglobin: 10.4 g/dL — ABNORMAL LOW (ref 12.0–15.0)
LYMPHS PCT: 23 %
Lymphs Abs: 0.9 10*3/uL (ref 0.7–4.0)
MCH: 30.4 pg (ref 26.0–34.0)
MCHC: 32.2 g/dL (ref 30.0–36.0)
MCV: 94.4 fL (ref 78.0–100.0)
Monocytes Absolute: 0.4 10*3/uL (ref 0.1–1.0)
Monocytes Relative: 11 %
NEUTROS PCT: 61 %
Neutro Abs: 2.5 10*3/uL (ref 1.7–7.7)
PLATELETS: 212 10*3/uL (ref 150–400)
RBC: 3.42 MIL/uL — AB (ref 3.87–5.11)
RDW: 15.4 % (ref 11.5–15.5)
WBC: 4 10*3/uL (ref 4.0–10.5)

## 2017-12-30 LAB — POCT RAPID STREP A (OFFICE): Rapid Strep A Screen: NEGATIVE

## 2017-12-30 MED ORDER — ATENOLOL 50 MG PO TABS: ORAL_TABLET | ORAL | 5 refills | Status: DC

## 2017-12-30 NOTE — Progress Notes (Signed)
   Subjective:    Patient ID: Joanna Reid, female    DOB: 1965-08-21, 52 y.o.   MRN: [id number] Patient arrives with numerous concerns HPI Pt here due to BP being high at [facility]. Cancer Center informed patient to see provider. BP was 170/105 on 12/20/2016. Long-standing history of hypertension.  On multiple medicines.  Claims complete compliance.   Pt also she is congested and ears ache, throat is raw, 99 fever on Saturday evening, kicked in fri and sat, has had allergies for couple weeks .throat raw, allergies acting up. Left ear pain   Has been going through a lot of challenges understandably with her multiple myeloma.  Managed by local, oncologist she feels very confident his skills this is Dr. Raliegh Ip.  Also has gone to see the [facility] team who are proposing eventual stem cell transplant to ensure care.  Patient is inclined towards this at this time   . Has been taking Loratadine for about 2 weeks.   Review of Systems No headache, no major weight loss or weight gain, no chest pain no back pain abdominal pain no change in bowel habits complete ROS otherwise negative     Objective:   Physical Exam  Alert and oriented, vitals reviewed and stable, NAD ENT-TM's and ext canals WNL bilat via otoscopic exam Soft palate, tonsils and post pharynx WNL via oropharyngeal exam Neck-symmetric, no masses; thyroid nonpalpable and nontender Pulmonary-no tachypnea or accessory muscle use; Clear without wheezes via auscultation Card--no abnrml murmurs, rhythm reg and rate WNL Carotid pulses symmetric, without bruits  Results for orders placed or performed in visit on 12/30/17  POCT rapid strep A  Result Value Ref Range   Rapid Strep A Screen Negative Negative    Of note reviewed blood work shows excellent renal function maintained      Assessment & Plan:  1 impression hypertension.  Suboptimum control still elevated on repeat.  Will add yet a fourth agent atenolol rationale  discussed.  2.  Probable URI negative strep screen mild congestion low-grade fever.  Of note recent white blood count within normal limits.  Hold off on ice rationale discussed.  3.  Multiple myeloma.  Discussed at length today with patient.  See above notations  Atenolol prescribed.  Recheck in several months for diabetes and chronic visit at that time

## 2017-12-31 LAB — KAPPA/LAMBDA LIGHT CHAINS
Kappa free light chain: 19.1 mg/L (ref 3.3–19.4)
Kappa, lambda light chain ratio: 0.48 (ref 0.26–1.65)
Lambda free light chains: 40.2 mg/L — ABNORMAL HIGH (ref 5.7–26.3)

## 2017-12-31 LAB — PROTEIN ELECTROPHORESIS, SERUM
A/G Ratio: 1.5 (ref 0.7–1.7)
ALPHA-2-GLOBULIN: 0.7 g/dL (ref 0.4–1.0)
Albumin ELP: 3.5 g/dL (ref 2.9–4.4)
Alpha-1-Globulin: 0.3 g/dL (ref 0.0–0.4)
BETA GLOBULIN: 0.9 g/dL (ref 0.7–1.3)
GAMMA GLOBULIN: 0.5 g/dL (ref 0.4–1.8)
Globulin, Total: 2.4 g/dL (ref 2.2–3.9)
M-SPIKE, %: 0.2 g/dL — AB
Total Protein ELP: 5.9 g/dL — ABNORMAL LOW (ref 6.0–8.5)

## 2017-12-31 LAB — STREP A DNA PROBE: Strep Gp A Direct, DNA Probe: NEGATIVE

## 2017-12-31 LAB — SPECIMEN STATUS REPORT

## 2018-01-03 ENCOUNTER — Inpatient Hospital Stay (HOSPITAL_BASED_OUTPATIENT_CLINIC_OR_DEPARTMENT_OTHER): Payer: BLUE CROSS/BLUE SHIELD | Admitting: Hematology

## 2018-01-03 ENCOUNTER — Encounter (HOSPITAL_COMMUNITY): Payer: Self-pay | Admitting: Hematology

## 2018-01-03 ENCOUNTER — Inpatient Hospital Stay (HOSPITAL_COMMUNITY): Payer: BLUE CROSS/BLUE SHIELD

## 2018-01-03 VITALS — BP 177/89 | HR 93 | Temp 98.2°F | Resp 18 | Wt 268.0 lb

## 2018-01-03 DIAGNOSIS — C9000 Multiple myeloma not having achieved remission: Secondary | ICD-10-CM

## 2018-01-03 DIAGNOSIS — Z5112 Encounter for antineoplastic immunotherapy: Secondary | ICD-10-CM | POA: Diagnosis not present

## 2018-01-03 MED ORDER — PROCHLORPERAZINE MALEATE 10 MG PO TABS: 10.0000 mg | ORAL_TABLET | Freq: Once | ORAL | Status: DC

## 2018-01-03 MED ORDER — BORTEZOMIB CHEMO SQ INJECTION 3.5 MG (2.5MG/ML)
1.3000 mg/m2 | Freq: Once | INTRAMUSCULAR | Status: AC
  Administered 2018-01-03: 3 mg via SUBCUTANEOUS
  Filled 2018-01-03: qty 1.2

## 2018-01-03 MED ORDER — LENALIDOMIDE 25 MG PO CAPS: ORAL_CAPSULE | ORAL | 3 refills | Status: DC

## 2018-01-03 NOTE — Progress Notes (Signed)
Joanna Reid,  [id number]   CLINIC:  Medical Oncology/Hematology  PCP:  Joanna Reid, [place] [address] [phone]   REASON FOR VISIT:  Follow-up for multiple myeloma.  CURRENT THERAPY: Velcade, Revlimid and dexamethasone every 3 weekly.  BRIEF ONCOLOGIC HISTORY:    Multiple myeloma ([facility])   10/29/2017 Initial Diagnosis    Multiple myeloma ([facility])      11/01/2017 -  Chemotherapy    The patient had bortezomib SQ (VELCADE) chemo injection 3 mg, 1.3 mg/m2 = 3 mg, Subcutaneous,  Once, 4 of 4 cycles Administration: 3 mg (11/01/2017), 3 mg (11/08/2017), 3 mg (11/05/2017), 3 mg (11/22/2017), 3 mg (11/12/2017), 3 mg (11/29/2017), 3 mg (11/26/2017), 3 mg (12/03/2017), 3 mg (12/13/2017), 3 mg (12/20/2017), 3 mg (12/17/2017), 3 mg (12/24/2017)  for chemotherapy treatment.         CANCER STAGING: Cancer Staging Multiple myeloma ([facility]) Staging form: Plasma Cell Myeloma and Plasma Cell Disorders, AJCC 8th Edition - Clinical: No stage assigned - Unsigned    INTERVAL HISTORY:  Joanna Reid 52 y.o. female returns for follow-up of multiple myeloma.  She finished taking Revlimid Wednesday night.  She denies any GI symptoms including diarrhea or constipation.  No nausea was reported.  No tingling or numbness next remedies.  No fevers or infections were reported.  She reports having remote history of kidney stone and is currently on Macrobid daily for recurrent UTIs.  She is concerned about it prior to going to stem cell transplant.  She is not having any symptoms from the kidney stone at this time.  She was reportedly started on Macrobid by Joanna Reid.    REVIEW OF SYSTEMS:  Review of Systems  Constitutional: Positive for fatigue.  All other systems reviewed and are negative.    PAST MEDICAL/SURGICAL HISTORY:  Past Medical History:  Diagnosis Date  . Acid reflux   . Allergic rhinitis   . Diabetes mellitus    type 2  .  Gout   . Gout   . HBP (high blood pressure)   . History of kidney stones   . Migraines    Past Surgical History:  Procedure Laterality Date  . CESAREAN SECTION    . EXTRACORPOREAL SHOCK WAVE LITHOTRIPSY Left 10/10/2017   Procedure: LEFT EXTRACORPOREAL SHOCK WAVE LITHOTRIPSY (ESWL);  Surgeon: Bjorn Loser, MD;  Location: [facility];  Service: Urology;  Laterality: Left;  . EYE SURGERY    . HEMORRHOID SURGERY N/A 11/19/2012   Procedure: HEMORRHOIDECTOMY;  Surgeon: Jamesetta So, MD;  Location: [facility];  Service: General;  Laterality: N/A;  . kidney stones  1998  . LAPAROSCOPIC UNILATERAL SALPINGO OOPHERECTOMY  05/14/2012   Procedure: LAPAROSCOPIC UNILATERAL SALPINGO OOPHORECTOMY;  Surgeon: [name], MD;  Location: [facility];  Service: Gynecology;  Laterality: Right;  laparoscopic right salpingo-oophorectomy  . PARTIAL HYSTERECTOMY    . TONSILECTOMY, ADENOIDECTOMY, BILATERAL MYRINGOTOMY AND TUBES    . VESICOVAGINAL FISTULA CLOSURE W/ TAH       SOCIAL HISTORY:  Social History   Socioeconomic History  . Marital status: Married    Spouse name: Not on file  . Number of children: Not on file  . Years of education: 42  . Highest education level: Not on file  Occupational History    Employer: [employer]  . Financial resource strain: Not on file  . Food insecurity:    Worry: Never true    Inability:  Never true  . Transportation needs:    Medical: Not on file    Non-medical: Not on file  Tobacco Use  . Smoking status: Former Smoker    Last attempt to quit: 02/27/1999    Years since quitting: 18.8  . Smokeless tobacco: Never Used  . Tobacco comment: socially   Substance and Sexual Activity  . Alcohol use: No    Alcohol/week: 0.0 oz  . Drug use: No  . Sexual activity: Yes  Lifestyle  . Physical activity:    Days per week: Not on file    Minutes per session: Not on file  . Stress: Not on file  Relationships  . Social connections:    Talks on phone: Not on file     Gets together: Not on file    Attends religious service: Not on file    Active member of club or organization: Not on file    Attends meetings of clubs or organizations: Not on file    Relationship status: Not on file  . Intimate partner violence:    Fear of current or ex partner: Not on file    Emotionally abused: Not on file    Physically abused: Not on file    Forced sexual activity: Not on file  Other Topics Concern  . Not on file  Social History Narrative  . Not on file    FAMILY HISTORY:  Family History  Problem Relation Age of Onset  . Arthritis Unknown   . Cancer Unknown   . Diabetes Unknown   . Hypertension Mother   . Dementia Mother   . Diabetes Father   . ALS Father   . Diabetes Brother   . Hypertension Brother   . Cancer Paternal Aunt   . COPD Maternal Grandmother   . Cancer Maternal Grandfather   . Anesthesia problems Paternal Grandfather     CURRENT MEDICATIONS:  Outpatient Encounter Medications as of 01/03/2018  Medication Sig Note  . acyclovir (ZOVIRAX) 400 MG tablet Take 1 tablet (400 mg total) by mouth 2 (two) times daily.   Marland Kitchen albuterol (PROVENTIL HFA;VENTOLIN HFA) 108 (90 Base) MCG/ACT inhaler Inhale 1-2 puffs into the lungs daily as needed.   Marland Kitchen allopurinol (ZYLOPRIM) 300 MG tablet TAKE 1 TABLET BY MOUTH  DAILY FOR GOUT   . aspirin EC 81 MG tablet Take 81 mg by mouth daily.   Marland Kitchen atenolol (TENORMIN) 50 MG tablet Take one tablet at bedtime.   . bumetanide (BUMEX) 1 MG tablet Take 1 tablet (1 mg total) by mouth daily.   . Calcium Carb-Cholecalciferol (CALCIUM 600+D3 PO) Take by mouth. 11/08/2017: Calcium 690m with Vitamin D 1,000 units BID  . dexamethasone (DECADRON) 4 MG tablet Take 10 tablets (40 mg total) by mouth once a week.   . diazepam (VALIUM) 5 MG tablet Take one qhs prn anxiety or insomnia.   . fluticasone (FLONASE) 50 MCG/ACT nasal spray Place 2 sprays into the nose 2 (two) times daily.   .Marland KitchenHYDROcodone-acetaminophen (NORCO/VICODIN) 5-325 MG  tablet Take 1 tablet by mouth every 6 (six) hours as needed for moderate pain.   . Lactulose 20 GM/30ML SOLN Take 371mby mouth every 3-4 hours until bowel movement. Then take 3067my mouth daily.   . lMarland Kitchennalidomide (REVLIMID) 25 MG capsule Take 1 capsule by mouth daily x 14 days in a row followed by a 7 day rest period. Then resume regimen.   . lMarland Kitchensartan (COZAAR) 100 MG tablet Take 1 tablet (100 mg total)  by mouth daily.   . meloxicam (MOBIC) 15 MG tablet TAKE 1 TABLET BY MOUTH ONCE DAILY   . metFORMIN (GLUCOPHAGE) 500 MG tablet TAKE 1 TABLET BY MOUTH ONCE DAILY WITH BREAKFAST   . nitrofurantoin, macrocrystal-monohydrate, (MACROBID) 100 MG capsule Take 100 mg by mouth daily.    Marland Kitchen omeprazole (PRILOSEC) 20 MG capsule TAKE 1 CAPSULE BY MOUTH  DAILY   . ondansetron (ZOFRAN) 8 MG tablet Take 1 tablet (8 mg total) by mouth every 8 (eight) hours as needed.   . polyethylene glycol powder (GLYCOLAX/MIRALAX) powder Take 1 Container by mouth daily.   . prochlorperazine (COMPAZINE) 10 MG tablet Take 1 tablet (10 mg total) by mouth every 6 (six) hours as needed for nausea or vomiting.   . verapamil (CALAN-SR) 240 MG CR tablet TAKE 1 TABLET BY MOUTH AT  BEDTIME   . [DISCONTINUED] lenalidomide (REVLIMID) 25 MG capsule Take 1 capsule by mouth daily x 14 days in a row followed by a 7 day rest period. Then resume regimen.    No facility-administered encounter medications on file as of 01/03/2018.     ALLERGIES:  No Known Allergies   PHYSICAL EXAM:  ECOG Performance status: 1  Vitals:   01/03/18 1005  BP: (!) 177/89  Pulse: 93  Resp: 18  Temp: 98.2 F (36.8 C)  SpO2: 98%   Filed Weights   01/03/18 1005  Weight: 268 lb (121.6 kg)    Physical Exam   LABORATORY DATA:  I have reviewed the labs as listed.  CBC    Component Value Date/Time   WBC 4.0 12/30/2017 0902   RBC 3.42 (L) 12/30/2017 0902   HGB 10.4 (L) 12/30/2017 0902   HGB 14.2 08/02/2015 1113   HCT 32.3 (L) 12/30/2017 0902   HCT 41.1  08/02/2015 1113   PLT 212 12/30/2017 0902   PLT 371 08/02/2015 1113   MCV 94.4 12/30/2017 0902   MCV 84 08/02/2015 1113   MCH 30.4 12/30/2017 0902   MCHC 32.2 12/30/2017 0902   RDW 15.4 12/30/2017 0902   RDW 15.1 08/02/2015 1113   LYMPHSABS 0.9 12/30/2017 0902   LYMPHSABS 2.9 08/02/2015 1113   MONOABS 0.4 12/30/2017 0902   EOSABS 0.2 12/30/2017 0902   EOSABS 0.2 08/02/2015 1113   BASOSABS 0.0 12/30/2017 0902   BASOSABS 0.1 08/02/2015 1113   CMP Latest Ref Rng & Units 12/30/2017 12/20/2017 12/13/2017  Glucose 65 - 99 mg/dL 271(H) 282(H) 263(H)  BUN 6 - 20 mg/dL _0 Creatinine 0.44 - 1.00 mg/dL 0.82 0.87 0.82  Sodium 135 - 145 mmol/L 139 138 140  Potassium 3.5 - 5.1 mmol/L 3.5 3.5 3.4(L)  Chloride 101 - 111 mmol/L 101 104 101  CO2 22 - 32 mmol/L _1 Calcium 8.9 - 10.3 mg/dL 8.4(L) 9.5 8.5(L)  Total Protein 6.5 - 8.1 g/dL 6.5 7.0 6.6  Total Bilirubin 0.3 - 1.2 mg/dL 1.0 0.8 1.1  Alkaline Phos 38 - 126 U/L 149(H) 192(H) 218(H)  AST 15 - 41 U/L 67(H) 63(H) 51(H)  ALT 14 - 54 U/L 68(H) 49 50         ASSESSMENT & PLAN:   Multiple myeloma ([facility]) 1.  IgG lambda multiple myeloma, stage III, del 17p: - Presentation with lower back pain since March, presented to the emergency room on 10/25/2017 with right groin pain, CT scan showed multiple lytic lesions, multiple lytic lesions on skeletal survey - SPEP shows 1.5 g/dL of monoclonal protein, beta-2 microglobulin of 4.8,  free lambda light chains of 2869, kappa by lambda ratio of less than 0, elevated LDH. 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  FISH panel shows -14, -12, 13 q.-/-13 and 17p-.  Bone marrow biopsy shows 90% plasma cells. -Cycle 1 of RVD started on 11/01/2017, received IV Cytoxan in the first week secondary to delay of getting Revlimid, finished her 14 days of Revlimid on 11/20/2017.  She tolerated it very well.  Cycle 2 was started on 11/22/2017 with Velcade on days 1, 4, 8, 11 every 21 days.  She does  not have any signs of neuropathy.  Cycle 3 was given on 12/13/2017. -She had a consultation with Dr. Laverta Baltimore at [facility] on 12/19/2017.  Upfront bone marrow transplant was recommended after 4 cycles because of her 17 P deletion. -She will proceed with cycle 4 of Velcade today.  She will start Revlimid on 01/09/2018.  I have discussed the results of free light chain ratio which has come down to 0.48.  This is in the normal range at this time.  Lambda light chains have decreased to 40 from 100.  M spike has been stable at 0.2 g/dL.  2.  Low back pain: She is taking hydrocodone as needed.  Pain has improved since the start of therapy for multiple myeloma.  She has taken only 1 pill in the last 6 weeks.  3.  Hypercalcemia: Her calcium was elevated at 10.8 on 10/29/2017.  She received fluids and Zometa.  This is no longer a problem.  She is receiving Zometa every 4 weeks.  4.  ID prophylaxis: She will continue acyclovir twice daily.  She will also continue aspirin 81 mg daily for DVT prophylaxis.  I tried to reach out to her urologist at [facility] urology.  I could not find a urologist by name Dr. Norman Clay (name provided by patient).  When I looked at her prior CT scan of the abdomen from April 2019, there was a left ureteral calculus, left pelvic calculus.    Orders placed this encounter:  Orders Placed This Encounter  Procedures  . Immunofixation electrophoresis  . Kappa/lambda light chains  . Protein electrophoresis, serum  . Comprehensive metabolic panel  . CBC with Differential      Derek Jack, MD [place] [phone]

## 2018-01-03 NOTE — Assessment & Plan Note (Signed)
1.  IgG lambda multiple myeloma, stage III, del 17p: - Presentation with lower back pain since March, presented to the emergency room on 10/25/2017 with right groin pain, CT scan showed multiple lytic lesions, multiple lytic lesions on skeletal survey - SPEP shows 1.5 g/dL of monoclonal protein, beta-2 microglobulin of 4.8, free lambda light chains of 2869, kappa by lambda ratio of less than 0, elevated LDH. 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  FISH panel shows -14, -12, 13 q.-/-13 and 17p-.  Bone marrow biopsy shows 90% plasma cells. -Cycle 1 of RVD started on 11/01/2017, received IV Cytoxan in the first week secondary to delay of getting Revlimid, finished her 14 days of Revlimid on 11/20/2017.  She tolerated it very well.  Cycle 2 was started on 11/22/2017 with Velcade on days 1, 4, 8, 11 every 21 days.  She does not have any signs of neuropathy.  Cycle 3 was given on 12/13/2017. -She had a consultation with Dr. Long at [facility] on 12/19/2017.  Upfront bone marrow transplant was recommended after 4 cycles because of her 17 P deletion. -She will proceed with cycle 4 of Velcade today.  She will start Revlimid on 01/09/2018.  I have discussed the results of free light chain ratio which has come down to 0.48.  This is in the normal range at this time.  Lambda light chains have decreased to 40 from 100.  M spike has been stable at 0.2 g/dL.  2.  Low back pain: She is taking hydrocodone as needed.  Pain has improved since the start of therapy for multiple myeloma.  She has taken only 1 pill in the last 6 weeks.  3.  Hypercalcemia: Her calcium was elevated at 10.8 on 10/29/2017.  She received fluids and Zometa.  This is no longer a problem.  She is receiving Zometa every 4 weeks.  4.  ID prophylaxis: She will continue acyclovir twice daily.  She will also continue aspirin 81 mg daily for DVT prophylaxis. 

## 2018-01-03 NOTE — Patient Instructions (Signed)
[facility] Discharge Instructions  You saw Dr. Katragadda today.   Thank you for choosing [facility] at [facility] to provide your oncology and hematology care.  To afford each patient quality time with our provider, please arrive at least 15 minutes before your scheduled appointment time.   If you have a lab appointment with the Cancer Center please come in thru the  Main Entrance and check in at the main information desk  You need to re-schedule your appointment should you arrive 10 or more minutes late.  We strive to give you quality time with our providers, and arriving late affects you and other patients whose appointments are after yours.  Also, if you no show three or more times for appointments you may be dismissed from the clinic at the providers discretion.     Again, thank you for choosing [facility].  Our hope is that these requests will decrease the amount of time that you wait before being seen by our physicians.       _____________________________________________________________  Should you have questions after your visit to [facility], please contact our office at [phone] between the hours of 8:30 a.m. and 4:30 p.m.  Voicemails left after 4:30 p.m. will not be returned until the following business day.  For prescription refill requests, have your pharmacy contact our office.       Resources For Cancer Patients and their Caregivers ? American Cancer Society: Can assist with transportation, wigs, general needs, runs Look Good Feel Better.        1-888-227-6333 ? Cancer Care: Provides financial assistance, online support groups, medication/co-pay assistance.  1-800-813-HOPE (4673) ? [facility] Assists [place] cancer patients and their families through emotional , educational and financial support.  336-427-4357 ? [place] DSS Where to apply for  food stamps, Medicaid and utility assistance. 336-342-1394 ? RCATS: Transportation to medical appointments. 336-347-2287 ? Social Security Administration: May apply for disability if have a Stage IV cancer. 336-342-7796 1-800-772-1213 ? [place] Aging, Disability and Transit Services: Assists with nutrition, care and transit needs. 336-349-2343  Cancer Center Support Programs:   > Cancer Support Group  2nd Tuesday of the month 1pm-2pm, Journey Room   > Creative Journey  3rd Tuesday of the month 1130am-1pm, Journey Room     

## 2018-01-03 NOTE — Progress Notes (Signed)
Patient seen today by Dr. Delton Coombes, labs reviewed proceed with treatment.   Patient is taking revlimid and has not missed any doses and reports no side effects at this time.   velcade injection given per orders. Treatment given per orders. Patient tolerated it well without problems. Vitals stable and discharged home from clinic ambulatory. Follow up as scheduled.

## 2018-01-07 ENCOUNTER — Encounter (HOSPITAL_COMMUNITY): Payer: Self-pay

## 2018-01-07 ENCOUNTER — Other Ambulatory Visit: Payer: Self-pay

## 2018-01-07 ENCOUNTER — Inpatient Hospital Stay (HOSPITAL_COMMUNITY): Payer: BLUE CROSS/BLUE SHIELD

## 2018-01-07 ENCOUNTER — Inpatient Hospital Stay (HOSPITAL_COMMUNITY): Payer: BLUE CROSS/BLUE SHIELD | Attending: Hematology

## 2018-01-07 VITALS — BP 173/97 | HR 78 | Temp 98.3°F | Resp 18

## 2018-01-07 DIAGNOSIS — C9000 Multiple myeloma not having achieved remission: Secondary | ICD-10-CM | POA: Diagnosis not present

## 2018-01-07 DIAGNOSIS — Z5112 Encounter for antineoplastic immunotherapy: Secondary | ICD-10-CM | POA: Insufficient documentation

## 2018-01-07 LAB — CBC WITH DIFFERENTIAL/PLATELET
BASOS ABS: 0 10*3/uL (ref 0.0–0.1)
Basophils Relative: 1 %
EOS ABS: 0.1 10*3/uL (ref 0.0–0.7)
EOS PCT: 2 %
HCT: 32.1 % — ABNORMAL LOW (ref 36.0–46.0)
Hemoglobin: 10.4 g/dL — ABNORMAL LOW (ref 12.0–15.0)
LYMPHS ABS: 1.3 10*3/uL (ref 0.7–4.0)
Lymphocytes Relative: 33 %
MCH: 30.8 pg (ref 26.0–34.0)
MCHC: 32.4 g/dL (ref 30.0–36.0)
MCV: 95 fL (ref 78.0–100.0)
Monocytes Absolute: 0.5 10*3/uL (ref 0.1–1.0)
Monocytes Relative: 13 %
Neutro Abs: 2 10*3/uL (ref 1.7–7.7)
Neutrophils Relative %: 51 %
PLATELETS: 212 10*3/uL (ref 150–400)
RBC: 3.38 MIL/uL — AB (ref 3.87–5.11)
RDW: 15.3 % (ref 11.5–15.5)
WBC: 3.8 10*3/uL — AB (ref 4.0–10.5)

## 2018-01-07 LAB — COMPREHENSIVE METABOLIC PANEL
ALK PHOS: 139 U/L — AB (ref 38–126)
ALT: 72 U/L — AB (ref 0–44)
AST: 72 U/L — AB (ref 15–41)
Albumin: 3.7 g/dL (ref 3.5–5.0)
Anion gap: 11 (ref 5–15)
BILIRUBIN TOTAL: 0.7 mg/dL (ref 0.3–1.2)
BUN: 20 mg/dL (ref 6–20)
CO2: 24 mmol/L (ref 22–32)
CREATININE: 0.85 mg/dL (ref 0.44–1.00)
Calcium: 8.7 mg/dL — ABNORMAL LOW (ref 8.9–10.3)
Chloride: 104 mmol/L (ref 98–111)
GFR calc Af Amer: >60 mL/min (ref >60)
Glucose, Bld: 302 mg/dL — ABNORMAL HIGH (ref 70–99)
Potassium: 3.7 mmol/L (ref 3.5–5.1)
Sodium: 139 mmol/L (ref 135–145)
TOTAL PROTEIN: 6.5 g/dL (ref 6.5–8.1)

## 2018-01-07 MED ORDER — PROCHLORPERAZINE MALEATE 10 MG PO TABS: 10.0000 mg | ORAL_TABLET | Freq: Once | ORAL | Status: DC

## 2018-01-07 MED ORDER — BORTEZOMIB CHEMO SQ INJECTION 3.5 MG (2.5MG/ML)
1.3000 mg/m2 | Freq: Once | INTRAMUSCULAR | Status: AC
  Administered 2018-01-07: 3 mg via SUBCUTANEOUS
  Filled 2018-01-07: qty 1.2

## 2018-01-07 NOTE — Patient Instructions (Signed)
[facility] Discharge Instructions for Patients Receiving Chemotherapy   Beginning January 23rd 2017 lab work for the Cancer Center will be done in the  Main lab at [employer] [facility] on 1st floor. If you have a lab appointment with the Cancer Center please come in thru the  Main Entrance and check in at the main information desk   Today you received the following chemotherapy agents   To help prevent nausea and vomiting after your treatment, we encourage you to take your nausea medication     If you develop nausea and vomiting, or diarrhea that is not controlled by your medication, call the clinic.  The clinic phone number is [phone]. Office hours are Monday-Friday 8:30am-5:00pm.  BELOW ARE SYMPTOMS THAT SHOULD BE REPORTED IMMEDIATELY:  *FEVER GREATER THAN 101.0 F  *CHILLS WITH OR WITHOUT FEVER  NAUSEA AND VOMITING THAT IS NOT CONTROLLED WITH YOUR NAUSEA MEDICATION  *UNUSUAL SHORTNESS OF BREATH  *UNUSUAL BRUISING OR BLEEDING  TENDERNESS IN MOUTH AND THROAT WITH OR WITHOUT PRESENCE OF ULCERS  *URINARY PROBLEMS  *BOWEL PROBLEMS  UNUSUAL RASH Items with * indicate a potential emergency and should be followed up as soon as possible. If you have an emergency after office hours please contact your primary care physician or go to the nearest emergency department.  Please call the clinic during office hours if you have any questions or concerns.   You may also contact the Patient Navigator at [phone] should you have any questions or need assistance in obtaining follow up care.      Resources For Cancer Patients and their Caregivers ? American Cancer Society: Can assist with transportation, wigs, general needs, runs Look Good Feel Better.        1-888-227-6333 ? Cancer Care: Provides financial assistance, online support groups, medication/co-pay assistance.  1-800-813-HOPE (4673) ? [facility] Assists [place] cancer  patients and their families through emotional , educational and financial support.  336-427-4357 ? [place] DSS Where to apply for food stamps, Medicaid and utility assistance. 336-342-1394 ? RCATS: Transportation to medical appointments. 336-347-2287 ? Social Security Administration: May apply for disability if have a Stage IV cancer. 336-342-7796 1-800-772-1213 ? [place] Aging, Disability and Transit Services: Assists with nutrition, care and transit needs. 336-349-2343         

## 2018-01-07 NOTE — Progress Notes (Signed)
Labs reviewed with Dr. Delton Coombes, will proceed with treatment.  Treatment given per orders. Patient tolerated it well without problems. Vitals stable and discharged home from clinic ambulatory. Follow up as scheduled.

## 2018-01-10 ENCOUNTER — Encounter (HOSPITAL_COMMUNITY): Payer: Self-pay

## 2018-01-10 ENCOUNTER — Inpatient Hospital Stay (HOSPITAL_COMMUNITY): Payer: BLUE CROSS/BLUE SHIELD

## 2018-01-10 VITALS — BP 164/96 | HR 85 | Temp 98.0°F | Resp 18

## 2018-01-10 DIAGNOSIS — C9000 Multiple myeloma not having achieved remission: Secondary | ICD-10-CM

## 2018-01-10 DIAGNOSIS — Z5112 Encounter for antineoplastic immunotherapy: Secondary | ICD-10-CM | POA: Diagnosis not present

## 2018-01-10 MED ORDER — BORTEZOMIB CHEMO SQ INJECTION 3.5 MG (2.5MG/ML)
1.3000 mg/m2 | Freq: Once | INTRAMUSCULAR | Status: AC
  Administered 2018-01-10: 3 mg via SUBCUTANEOUS
  Filled 2018-01-10: qty 1.2

## 2018-01-10 MED ORDER — PROCHLORPERAZINE MALEATE 10 MG PO TABS: 10.0000 mg | ORAL_TABLET | Freq: Once | ORAL | Status: DC

## 2018-01-10 NOTE — Progress Notes (Signed)
Patient for velcade shot today.  Patient stated she had a low grade temp last week and last night.  The temp reached 99.3 last night but no fever today.  Stated she was seen by Dr. Wolfgang Phoenix last week and was checked out with no findings.  No problems with sinus drainage or cough.  Only Left sided sinus discomfort.  Patient has a history of kidney stones.  Reviewed history with Dr. Delton Coombes and ok to treat today.   Patient tolerated velcade shot with no complaints of pain at site. Site clean and dry with band aid applied.  VSS with discharge and left ambulatory with family.  No s/s of distress noted.

## 2018-01-10 NOTE — Patient Instructions (Signed)
[facility] Discharge Instructions for Patients Receiving Chemotherapy  Today you received the following chemotherapy agents velcade.    If you develop nausea and vomiting that is not controlled by your nausea medication, call the clinic.   BELOW ARE SYMPTOMS THAT SHOULD BE REPORTED IMMEDIATELY:  *FEVER GREATER THAN 100.5 F  *CHILLS WITH OR WITHOUT FEVER  NAUSEA AND VOMITING THAT IS NOT CONTROLLED WITH YOUR NAUSEA MEDICATION  *UNUSUAL SHORTNESS OF BREATH  *UNUSUAL BRUISING OR BLEEDING  TENDERNESS IN MOUTH AND THROAT WITH OR WITHOUT PRESENCE OF ULCERS  *URINARY PROBLEMS  *BOWEL PROBLEMS  UNUSUAL RASH Items with * indicate a potential emergency and should be followed up as soon as possible.  Feel free to call the clinic should you have any questions or concerns. The clinic phone number is [phone].  Please show the CHEMO ALERT CARD at check-in to the Emergency Department and triage nurse.   

## 2018-01-11 ENCOUNTER — Other Ambulatory Visit: Payer: Self-pay | Admitting: Family Medicine

## 2018-01-14 ENCOUNTER — Inpatient Hospital Stay (HOSPITAL_COMMUNITY): Payer: BLUE CROSS/BLUE SHIELD

## 2018-01-14 ENCOUNTER — Encounter (HOSPITAL_COMMUNITY): Payer: Self-pay

## 2018-01-14 VITALS — BP 147/89 | HR 90 | Temp 97.7°F | Resp 18 | Wt 266.0 lb

## 2018-01-14 DIAGNOSIS — C9000 Multiple myeloma not having achieved remission: Secondary | ICD-10-CM | POA: Diagnosis not present

## 2018-01-14 DIAGNOSIS — Z5112 Encounter for antineoplastic immunotherapy: Secondary | ICD-10-CM | POA: Diagnosis not present

## 2018-01-14 LAB — COMPREHENSIVE METABOLIC PANEL
ALBUMIN: 4 g/dL (ref 3.5–5.0)
ALT: 68 U/L — AB (ref 0–44)
AST: 64 U/L — AB (ref 15–41)
Alkaline Phosphatase: 134 U/L — ABNORMAL HIGH (ref 38–126)
Anion gap: 12 (ref 5–15)
BUN: 19 mg/dL (ref 6–20)
CHLORIDE: 100 mmol/L (ref 98–111)
CO2: 25 mmol/L (ref 22–32)
CREATININE: 1 mg/dL (ref 0.44–1.00)
Calcium: 9.6 mg/dL (ref 8.9–10.3)
GFR calc Af Amer: >60 mL/min (ref >60)
GFR calc non Af Amer: >60 mL/min (ref >60)
GLUCOSE: 280 mg/dL — AB (ref 70–99)
Potassium: 3.5 mmol/L (ref 3.5–5.1)
SODIUM: 137 mmol/L (ref 135–145)
Total Bilirubin: 1 mg/dL (ref 0.3–1.2)
Total Protein: 6.7 g/dL (ref 6.5–8.1)

## 2018-01-14 LAB — CBC WITH DIFFERENTIAL/PLATELET
BASOS ABS: 0 10*3/uL (ref 0.0–0.1)
Basophils Relative: 0 %
EOS PCT: 3 %
Eosinophils Absolute: 0.1 10*3/uL (ref 0.0–0.7)
HCT: 34.8 % — ABNORMAL LOW (ref 36.0–46.0)
Hemoglobin: 11.6 g/dL — ABNORMAL LOW (ref 12.0–15.0)
Lymphocytes Relative: 32 %
Lymphs Abs: 1.6 10*3/uL (ref 0.7–4.0)
MCH: 30.9 pg (ref 26.0–34.0)
MCHC: 33.3 g/dL (ref 30.0–36.0)
MCV: 92.8 fL (ref 78.0–100.0)
Monocytes Absolute: 0.6 10*3/uL (ref 0.1–1.0)
Monocytes Relative: 12 %
Neutro Abs: 2.6 10*3/uL (ref 1.7–7.7)
Neutrophils Relative %: 53 %
PLATELETS: 139 10*3/uL — AB (ref 150–400)
RBC: 3.75 MIL/uL — AB (ref 3.87–5.11)
RDW: 15.3 % (ref 11.5–15.5)
WBC: 4.9 10*3/uL (ref 4.0–10.5)

## 2018-01-14 MED ORDER — BORTEZOMIB CHEMO SQ INJECTION 3.5 MG (2.5MG/ML)
1.3000 mg/m2 | Freq: Once | INTRAMUSCULAR | Status: AC
  Administered 2018-01-14: 3 mg via SUBCUTANEOUS
  Filled 2018-01-14: qty 1.2

## 2018-01-14 MED ORDER — PROCHLORPERAZINE MALEATE 10 MG PO TABS: 10.0000 mg | ORAL_TABLET | Freq: Once | ORAL | Status: DC

## 2018-01-14 NOTE — Patient Instructions (Signed)
[facility] Discharge Instructions for Patients Receiving Chemotherapy   Beginning January 23rd 2017 lab work for the Cancer Center will be done in the  Main lab at [facility] on 1st floor. If you have a lab appointment with the Cancer Center please come in thru the  Main Entrance and check in at the main information desk   Today you received the following chemotherapy agents Velcade injection. Follow-up as scheduled. Call clinic for any questions or concerns  To help prevent nausea and vomiting after your treatment, we encourage you to take your nausea medication   If you develop nausea and vomiting, or diarrhea that is not controlled by your medication, call the clinic.  The clinic phone number is [phone]. Office hours are Monday-Friday 8:30am-5:00pm.  BELOW ARE SYMPTOMS THAT SHOULD BE REPORTED IMMEDIATELY:  *FEVER GREATER THAN 101.0 F  *CHILLS WITH OR WITHOUT FEVER  NAUSEA AND VOMITING THAT IS NOT CONTROLLED WITH YOUR NAUSEA MEDICATION  *UNUSUAL SHORTNESS OF BREATH  *UNUSUAL BRUISING OR BLEEDING  TENDERNESS IN MOUTH AND THROAT WITH OR WITHOUT PRESENCE OF ULCERS  *URINARY PROBLEMS  *BOWEL PROBLEMS  UNUSUAL RASH Items with * indicate a potential emergency and should be followed up as soon as possible. If you have an emergency after office hours please contact your primary care physician or go to the nearest emergency department.  Please call the clinic during office hours if you have any questions or concerns.   You may also contact the Patient Navigator at [phone] should you have any questions or need assistance in obtaining follow up care.      Resources For Cancer Patients and their Caregivers ? American Cancer Society: Can assist with transportation, wigs, general needs, runs Look Good Feel Better.        1-888-227-6333 ? Cancer Care: Provides financial assistance, online support groups, medication/co-pay assistance.   1-800-813-HOPE (4673) ? [facility] Assists [place] cancer patients and their families through emotional , educational and financial support.  336-427-4357 ? [place] DSS Where to apply for food stamps, Medicaid and utility assistance. 336-342-1394 ? RCATS: Transportation to medical appointments. 336-347-2287 ? Social Security Administration: May apply for disability if have a Stage IV cancer. 336-342-7796 1-800-772-1213 ? [place] Aging, Disability and Transit Services: Assists with nutrition, care and transit needs. 336-349-2343         

## 2018-01-14 NOTE — Progress Notes (Signed)
Joanna Reid tolerated Velcade injection well without complaints or incident.Labs reviewed prior to administering this medication. VSS Pt discharged self ambulatory in satisfactory condition accompanied by a family member

## 2018-01-15 ENCOUNTER — Encounter (HOSPITAL_COMMUNITY): Payer: Self-pay | Admitting: *Deleted

## 2018-01-15 ENCOUNTER — Telehealth (HOSPITAL_COMMUNITY): Payer: Self-pay | Admitting: *Deleted

## 2018-01-15 NOTE — Telephone Encounter (Signed)
Pt aware that letter has been written and will be faxed in the morning once Dr. Delton Coombes signs it.

## 2018-01-19 ENCOUNTER — Other Ambulatory Visit (HOSPITAL_COMMUNITY): Payer: Self-pay | Admitting: Hematology

## 2018-01-19 DIAGNOSIS — C9000 Multiple myeloma not having achieved remission: Secondary | ICD-10-CM

## 2018-01-20 ENCOUNTER — Inpatient Hospital Stay (HOSPITAL_COMMUNITY): Payer: BLUE CROSS/BLUE SHIELD

## 2018-01-20 DIAGNOSIS — C9000 Multiple myeloma not having achieved remission: Secondary | ICD-10-CM

## 2018-01-20 DIAGNOSIS — Z5112 Encounter for antineoplastic immunotherapy: Secondary | ICD-10-CM | POA: Diagnosis not present

## 2018-01-20 LAB — COMPREHENSIVE METABOLIC PANEL
ALBUMIN: 3.9 g/dL (ref 3.5–5.0)
ALK PHOS: 106 U/L (ref 38–126)
ALT: 62 U/L — AB (ref 0–44)
ANION GAP: 13 (ref 5–15)
AST: 57 U/L — ABNORMAL HIGH (ref 15–41)
BUN: 24 mg/dL — ABNORMAL HIGH (ref 6–20)
CALCIUM: 9.6 mg/dL (ref 8.9–10.3)
CHLORIDE: 102 mmol/L (ref 98–111)
CO2: 25 mmol/L (ref 22–32)
CREATININE: 0.97 mg/dL (ref 0.44–1.00)
GFR calc Af Amer: >60 mL/min (ref >60)
GFR calc non Af Amer: >60 mL/min (ref >60)
GLUCOSE: 288 mg/dL — AB (ref 70–99)
Potassium: 3.1 mmol/L — ABNORMAL LOW (ref 3.5–5.1)
Sodium: 140 mmol/L (ref 135–145)
Total Bilirubin: 0.9 mg/dL (ref 0.3–1.2)
Total Protein: 6.8 g/dL (ref 6.5–8.1)

## 2018-01-20 LAB — CBC WITH DIFFERENTIAL/PLATELET
Basophils Absolute: 0 10*3/uL (ref 0.0–0.1)
Basophils Relative: 0 %
Eosinophils Absolute: 0.1 10*3/uL (ref 0.0–0.7)
Eosinophils Relative: 2 %
HEMATOCRIT: 33 % — AB (ref 36.0–46.0)
Hemoglobin: 11 g/dL — ABNORMAL LOW (ref 12.0–15.0)
LYMPHS ABS: 1.2 10*3/uL (ref 0.7–4.0)
Lymphocytes Relative: 22 %
MCH: 31.1 pg (ref 26.0–34.0)
MCHC: 33.3 g/dL (ref 30.0–36.0)
MCV: 93.2 fL (ref 78.0–100.0)
MONO ABS: 0.6 10*3/uL (ref 0.1–1.0)
MONOS PCT: 12 %
NEUTROS ABS: 3.4 10*3/uL (ref 1.7–7.7)
Neutrophils Relative %: 64 %
Platelets: 205 10*3/uL (ref 150–400)
RBC: 3.54 MIL/uL — ABNORMAL LOW (ref 3.87–5.11)
RDW: 15.3 % (ref 11.5–15.5)
WBC: 5.3 10*3/uL (ref 4.0–10.5)

## 2018-01-21 ENCOUNTER — Encounter (HOSPITAL_COMMUNITY): Payer: Self-pay | Admitting: *Deleted

## 2018-01-21 ENCOUNTER — Other Ambulatory Visit (HOSPITAL_COMMUNITY): Payer: Self-pay | Admitting: *Deleted

## 2018-01-21 ENCOUNTER — Ambulatory Visit (HOSPITAL_COMMUNITY): Payer: Self-pay

## 2018-01-21 DIAGNOSIS — C9000 Multiple myeloma not having achieved remission: Secondary | ICD-10-CM

## 2018-01-21 LAB — PROTEIN ELECTROPHORESIS, SERUM
A/G RATIO SPE: 1.2 (ref 0.7–1.7)
ALBUMIN ELP: 3.4 g/dL (ref 2.9–4.4)
ALPHA-1-GLOBULIN: 0.3 g/dL (ref 0.0–0.4)
Alpha-2-Globulin: 0.9 g/dL (ref 0.4–1.0)
BETA GLOBULIN: 1.2 g/dL (ref 0.7–1.3)
GAMMA GLOBULIN: 0.4 g/dL (ref 0.4–1.8)
GLOBULIN, TOTAL: 2.8 g/dL (ref 2.2–3.9)
Total Protein ELP: 6.2 g/dL (ref 6.0–8.5)

## 2018-01-21 LAB — KAPPA/LAMBDA LIGHT CHAINS
KAPPA FREE LGHT CHN: 20.2 mg/L — AB (ref 3.3–19.4)
Kappa, lambda light chain ratio: 0.74 (ref 0.26–1.65)
Lambda free light chains: 27.2 mg/L — ABNORMAL HIGH (ref 5.7–26.3)

## 2018-01-21 LAB — IMMUNOFIXATION ELECTROPHORESIS
IGA: 81 mg/dL — AB (ref 87–352)
IGG (IMMUNOGLOBIN G), SERUM: 479 mg/dL — AB (ref 700–1600)
IGM (IMMUNOGLOBULIN M), SRM: 40 mg/dL (ref 26–217)
TOTAL PROTEIN ELP: 6.3 g/dL (ref 6.0–8.5)

## 2018-01-21 MED ORDER — LENALIDOMIDE 25 MG PO CAPS: ORAL_CAPSULE | ORAL | 0 refills | Status: DC

## 2018-01-21 NOTE — Progress Notes (Signed)
I spoke with Steffanie Dunn, transplant coordinator, at [facility] and she advised that it is okay for [name] to give Velcade and Zometa this week.  The patient is to stop revlimid, dexamethasone moving forward until they determine if she is a candidate for transplant.  Patient is to stop aspirin pending surgery for placement of Hickman catheter next week.  I called and spoke with patient and she is aware and verbalizes understanding.

## 2018-01-23 ENCOUNTER — Encounter (HOSPITAL_COMMUNITY): Payer: Self-pay | Admitting: Hematology

## 2018-01-23 ENCOUNTER — Inpatient Hospital Stay (HOSPITAL_BASED_OUTPATIENT_CLINIC_OR_DEPARTMENT_OTHER): Payer: BLUE CROSS/BLUE SHIELD | Admitting: Hematology

## 2018-01-23 VITALS — BP 157/85 | HR 90 | Temp 98.2°F | Resp 18 | Wt 265.0 lb

## 2018-01-23 DIAGNOSIS — Z5112 Encounter for antineoplastic immunotherapy: Secondary | ICD-10-CM | POA: Diagnosis not present

## 2018-01-23 DIAGNOSIS — C9000 Multiple myeloma not having achieved remission: Secondary | ICD-10-CM

## 2018-01-23 NOTE — Progress Notes (Signed)
Joanna Reid, [place] [address]   CLINIC:  Medical Oncology/Hematology  PCP:  Mikey Kirschner, MD [place] [address] [phone]   REASON FOR VISIT:  Follow-up for multiple myeloma  CURRENT THERAPY: revlimid, valcade and dexamethasone every 3 weeks  BRIEF ONCOLOGIC HISTORY:    Multiple myeloma ([place])   10/29/2017 Initial Diagnosis    Multiple myeloma ([place])      11/01/2017 -  Chemotherapy    The patient had bortezomib SQ (VELCADE) chemo injection 3 mg, 1.3 mg/m2 = 3 mg, Subcutaneous,  Once, 4 of 4 cycles Administration: 3 mg (11/01/2017), 3 mg (11/08/2017), 3 mg (11/05/2017), 3 mg (11/22/2017), 3 mg (11/12/2017), 3 mg (11/29/2017), 3 mg (11/26/2017), 3 mg (12/03/2017), 3 mg (12/13/2017), 3 mg (12/20/2017), 3 mg (12/17/2017), 3 mg (12/24/2017), 3 mg (01/03/2018), 3 mg (01/10/2018), 3 mg (01/07/2018), 3 mg (01/14/2018)  for chemotherapy treatment.         CANCER STAGING: Cancer Staging Multiple myeloma ([place]) Staging form: Plasma Cell Myeloma and Plasma Cell Disorders, AJCC 8th Edition - Clinical: No stage assigned - Unsigned    INTERVAL HISTORY:  Joanna Reid 52 y.o. female returns for routine follow-up for multiple myeloma. Patient here today with her friend. Patient doing well with treatment. Patient goes for transplant August 15th at [facility]. She follows up at [facility] this Monday. She states she feels good her energy levels are remaining good at 75% and her appetite is 100%. Patient denies any pains. Denies any numbness or tingling in her hands or feet.     REVIEW OF SYSTEMS:  Review of Systems  Constitutional: Positive for fatigue.  HENT:  Negative.   Eyes: Negative.   Respiratory: Negative.   Cardiovascular: Negative.   Gastrointestinal: Negative.   Endocrine: Negative.   Genitourinary: Negative.    Skin: Negative.   Neurological: Positive for extremity weakness.  Hematological: Negative.   Psychiatric/Behavioral:  Negative.      PAST MEDICAL/SURGICAL HISTORY:  Past Medical History:  Diagnosis Date  . Acid reflux   . Allergic rhinitis   . Diabetes mellitus    type 2  . Gout   . Gout   . HBP (high blood pressure)   . History of kidney stones   . Migraines    Past Surgical History:  Procedure Laterality Date  . CESAREAN SECTION    . EXTRACORPOREAL SHOCK WAVE LITHOTRIPSY Left 10/10/2017   Procedure: LEFT EXTRACORPOREAL SHOCK WAVE LITHOTRIPSY (ESWL);  Surgeon: Bjorn Loser, MD;  Location: [facility];  Service: Urology;  Laterality: Left;  . EYE SURGERY    . HEMORRHOID SURGERY N/A 11/19/2012   Procedure: HEMORRHOIDECTOMY;  Surgeon: Jamesetta So, MD;  Location: [facility];  Service: General;  Laterality: N/A;  . kidney stones  1998  . LAPAROSCOPIC UNILATERAL SALPINGO OOPHERECTOMY  05/14/2012   Procedure: LAPAROSCOPIC UNILATERAL SALPINGO OOPHORECTOMY;  Surgeon: [name], MD;  Location: [facility];  Service: Gynecology;  Laterality: Right;  laparoscopic right salpingo-oophorectomy  . PARTIAL HYSTERECTOMY    . TONSILECTOMY, ADENOIDECTOMY, BILATERAL MYRINGOTOMY AND TUBES    . VESICOVAGINAL FISTULA CLOSURE W/ TAH       SOCIAL HISTORY:  Social History   Socioeconomic History  . Marital status: Married    Spouse name: Not on file  . Number of children: Not on file  . Years of education: 108  . Highest education level: Not on file  Occupational History    Employer: [employer]  .  Financial resource strain: Not on file  . Food insecurity:    Worry: Never true    Inability: Never true  . Transportation needs:    Medical: Not on file    Non-medical: Not on file  Tobacco Use  . Smoking status: Former Smoker    Last attempt to quit: 02/27/1999    Years since quitting: 18.9  . Smokeless tobacco: Never Used  . Tobacco comment: socially   Substance and Sexual Activity  . Alcohol use: No    Alcohol/week: 0.0 oz  . Drug use: No  . Sexual activity: Yes  Lifestyle  . Physical activity:      Days per week: Not on file    Minutes per session: Not on file  . Stress: Not on file  Relationships  . Social connections:    Talks on phone: Not on file    Gets together: Not on file    Attends religious service: Not on file    Active member of club or organization: Not on file    Attends meetings of clubs or organizations: Not on file    Relationship status: Not on file  . Intimate partner violence:    Fear of current or ex partner: Not on file    Emotionally abused: Not on file    Physically abused: Not on file    Forced sexual activity: Not on file  Other Topics Concern  . Not on file  Social History Narrative  . Not on file    FAMILY HISTORY:  Family History  Problem Relation Age of Onset  . Arthritis Unknown   . Cancer Unknown   . Diabetes Unknown   . Hypertension Mother   . Dementia Mother   . Diabetes Father   . ALS Father   . Diabetes Brother   . Hypertension Brother   . Cancer Paternal Aunt   . COPD Maternal Grandmother   . Cancer Maternal Grandfather   . Anesthesia problems Paternal Grandfather     CURRENT MEDICATIONS:  Outpatient Encounter Medications as of 01/23/2018  Medication Sig Note  . acyclovir (ZOVIRAX) 400 MG tablet Take 1 tablet (400 mg total) by mouth 2 (two) times daily.   Marland Kitchen albuterol (PROVENTIL HFA;VENTOLIN HFA) 108 (90 Base) MCG/ACT inhaler Inhale 1-2 puffs into the lungs daily as needed.   Marland Kitchen albuterol (PROVENTIL HFA;VENTOLIN HFA) 108 (90 Base) MCG/ACT inhaler Inhale into the lungs.   Marland Kitchen allopurinol (ZYLOPRIM) 300 MG tablet TAKE 1 TABLET BY MOUTH  DAILY FOR GOUT   . aspirin EC 81 MG tablet Take 81 mg by mouth daily.   . bumetanide (BUMEX) 1 MG tablet Take 1 tablet (1 mg total) by mouth daily.   . Calcium Carb-Cholecalciferol (CALCIUM 600+D3 PO) Take by mouth. 11/08/2017: Calcium 624m with Vitamin D 1,000 units BID  . dexamethasone (DECADRON) 4 MG tablet Take 10 tablets (40 mg total) by mouth once a week.   . diazepam (VALIUM) 5 MG tablet  Take one qhs prn anxiety or insomnia.   . fluticasone (FLONASE) 50 MCG/ACT nasal spray Place 2 sprays into the nose 2 (two) times daily.   .Marland KitchenHYDROcodone-acetaminophen (NORCO/VICODIN) 5-325 MG tablet Take 1 tablet by mouth every 6 (six) hours as needed for moderate pain.   . Lactulose 20 GM/30ML SOLN Take 317mby mouth every 3-4 hours until bowel movement. Then take 3048my mouth daily.   . lMarland Kitchennalidomide (REVLIMID) 25 MG capsule Take 1 capsule by mouth daily x 14 days in a row  followed by a 7 day rest period. Then resume regimen.   Marland Kitchen losartan (COZAAR) 100 MG tablet Take 1 tablet (100 mg total) by mouth daily.   Marland Kitchen losartan (COZAAR) 100 MG tablet TAKE 1 TABLET BY MOUTH  DAILY   . meloxicam (MOBIC) 15 MG tablet TAKE 1 TABLET BY MOUTH ONCE DAILY   . metFORMIN (GLUCOPHAGE) 500 MG tablet TAKE 1 TABLET BY MOUTH ONCE DAILY WITH BREAKFAST   . nitrofurantoin, macrocrystal-monohydrate, (MACROBID) 100 MG capsule Take 100 mg by mouth daily.    Marland Kitchen omeprazole (PRILOSEC) 20 MG capsule TAKE 1 CAPSULE BY MOUTH  DAILY   . ondansetron (ZOFRAN) 8 MG tablet Take 1 tablet (8 mg total) by mouth every 8 (eight) hours as needed.   . polyethylene glycol powder (GLYCOLAX/MIRALAX) powder Take 1 Container by mouth daily.   . prochlorperazine (COMPAZINE) 10 MG tablet Take 1 tablet (10 mg total) by mouth every 6 (six) hours as needed for nausea or vomiting.   . verapamil (CALAN-SR) 240 MG CR tablet TAKE 1 TABLET BY MOUTH AT  BEDTIME   . [DISCONTINUED] atenolol (TENORMIN) 50 MG tablet Take one tablet at bedtime.   . [DISCONTINUED] valACYclovir (VALTREX) 1000 MG tablet     No facility-administered encounter medications on file as of 01/23/2018.     ALLERGIES:  No Known Allergies   PHYSICAL EXAM:  ECOG Performance status: 1  Vitals:   01/23/18 1126  BP: (!) 157/85  Pulse: 90  Resp: 18  Temp: 98.2 F (36.8 C)  SpO2: 97%   Filed Weights   01/23/18 1126  Weight: 265 lb (120.2 kg)    Physical Exam   LABORATORY  DATA:  I have reviewed the labs as listed.  CBC    Component Value Date/Time   WBC 5.3 01/20/2018 0923   RBC 3.54 (L) 01/20/2018 0923   HGB 11.0 (L) 01/20/2018 0923   HGB 14.2 08/02/2015 1113   HCT 33.0 (L) 01/20/2018 0923   HCT 41.1 08/02/2015 1113   PLT 205 01/20/2018 0923   PLT 371 08/02/2015 1113   MCV 93.2 01/20/2018 0923   MCV 84 08/02/2015 1113   MCH 31.1 01/20/2018 0923   MCHC 33.3 01/20/2018 0923   RDW 15.3 01/20/2018 0923   RDW 15.1 08/02/2015 1113   LYMPHSABS 1.2 01/20/2018 0923   LYMPHSABS 2.9 08/02/2015 1113   MONOABS 0.6 01/20/2018 0923   EOSABS 0.1 01/20/2018 0923   EOSABS 0.2 08/02/2015 1113   BASOSABS 0.0 01/20/2018 0923   BASOSABS 0.1 08/02/2015 1113   CMP Latest Ref Rng & Units 01/20/2018 01/14/2018 01/07/2018  Glucose 70 - 99 mg/dL 288(H) 280(H) 302(H)  BUN 6 - 20 mg/dL 24(H) 19 20  Creatinine 0.44 - 1.00 mg/dL 0.97 1.00 0.85  Sodium 135 - 145 mmol/L 140 137 139  Potassium 3.5 - 5.1 mmol/L 3.1(L) 3.5 3.7  Chloride 98 - 111 mmol/L 102 100 104  CO2 22 - 32 mmol/L _0 Calcium 8.9 - 10.3 mg/dL 9.6 9.6 8.7(L)  Total Protein 6.5 - 8.1 g/dL 6.8 6.7 6.5  Total Bilirubin 0.3 - 1.2 mg/dL 0.9 1.0 0.7  Alkaline Phos 38 - 126 U/L 106 134(H) 139(H)  AST 15 - 41 U/L 57(H) 64(H) 72(H)  ALT 0 - 44 U/L 62(H) 68(H) 72(H)       ASSESSMENT & PLAN:   Multiple myeloma (H[facility]) 1.  IgG lambda multiple myeloma, stage III, del 17p: - Presentation with lower back pain since March, presented to the emergency room  on 10/25/2017 with right groin pain, CT scan showed multiple lytic lesions, multiple lytic lesions on skeletal survey - SPEP shows 1.5 g/dL of monoclonal protein, beta-2 microglobulin of 4.8, free lambda light chains of 2869, kappa by lambda ratio of less than 0, elevated LDH. 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  FISH panel shows -14, -12, 13 q.-/-13 and 17p-.  Bone marrow biopsy shows 90% plasma cells. -Cycle 1 of RVD started on 11/01/2017,  received IV Cytoxan in the first week secondary to delay of getting Revlimid, finished her 14 days of Revlimid on 11/20/2017.  She tolerated it very well.  Cycle 2 was started on 11/22/2017 with Velcade on days 1, 4, 8, 11 every 21 days.  She does not have any signs of neuropathy.  Cycle 3 was given on 12/13/2017. -She had a consultation with Dr. Laverta Baltimore at [facility] on 12/19/2017.  Upfront bone marrow transplant was recommended after 4 cycles because of her 17 P deletion. - Cycle 4 Velcade was started on 01/03/2018.  Revlimid was started on 01/09/2018.  She has tolerated them very well.  No signs of peripheral neuropathy or other side effects from Revlimid noted. -We discussed the results of myeloma blood work from 01/20/2018.  M spike was undetectable.  Lambda light chains have come down to 27.2.  Free light chain ratio was 0.74.  Serum immunofixation was positive for IgG lambda. -She will come back tomorrow to receive her Velcade and Zometa.  She is going back on Monday to [facility] for pretransplant work-up.  Tentative date of transplant is February 20, 2018.  We will see her back 4 to 6 weeks after transplant.  2.  Low back pain: Her pain completely improved after start of myeloma therapy.  She rarely needs hydrocodone.  3.  Hypercalcemia: Her calcium was elevated at 10.8 on 10/29/2017.  She received fluids and Zometa.  This is no longer a problem.  She is receiving Zometa every 4 weeks.  4.  ID prophylaxis: She will continue acyclovir twice daily.  She will also continue aspirin 81 mg daily for DVT prophylaxis.      Orders placed this encounter:  Orders Placed This Encounter  Procedures  . CBC with Differential/Platelet  . Comprehensive metabolic panel  . Lactate dehydrogenase      Derek Jack, MD [place] [phone]

## 2018-01-23 NOTE — Patient Instructions (Signed)
[facility] at [facility] Discharge Instructions  Today you saw Dr. K.   Thank you for choosing [facility] at [facility] to provide your oncology and hematology care.  To afford each patient quality time with our provider, please arrive at least 15 minutes before your scheduled appointment time.   If you have a lab appointment with the Cancer Center please come in thru the  Main Entrance and check in at the main information desk  You need to re-schedule your appointment should you arrive 10 or more minutes late.  We strive to give you quality time with our providers, and arriving late affects you and other patients whose appointments are after yours.  Also, if you no show three or more times for appointments you may be dismissed from the clinic at the providers discretion.     Again, thank you for choosing [facility].  Our hope is that these requests will decrease the amount of time that you wait before being seen by our physicians.       _____________________________________________________________  Should you have questions after your visit to [facility], please contact our office at [phone] between the hours of 8:30 a.m. and 4:30 p.m.  Voicemails left after 4:30 p.m. will not be returned until the following business day.  For prescription refill requests, have your pharmacy contact our office.       Resources For Cancer Patients and their Caregivers ? American Cancer Society: Can assist with transportation, wigs, general needs, runs Look Good Feel Better.        1-888-227-6333 ? Cancer Care: Provides financial assistance, online support groups, medication/co-pay assistance.  1-800-813-HOPE (4673) ? [facility] Assists [place] cancer patients and their families through emotional , educational and financial support.  336-427-4357 ? [place] DSS Where to apply for food  stamps, Medicaid and utility assistance. 336-342-1394 ? RCATS: Transportation to medical appointments. 336-347-2287 ? Social Security Administration: May apply for disability if have a Stage IV cancer. 336-342-7796 1-800-772-1213 ? [place] Aging, Disability and Transit Services: Assists with nutrition, care and transit needs. 336-349-2343  Cancer Center Support Programs:   > Cancer Support Group  2nd Tuesday of the month 1pm-2pm, Journey Room   > Creative Journey  3rd Tuesday of the month 1130am-1pm, Journey Room    

## 2018-01-23 NOTE — Assessment & Plan Note (Signed)
1.  IgG lambda multiple myeloma, stage III, del 17p: - Presentation with lower back pain since March, presented to the emergency room on 10/25/2017 with right groin pain, CT scan showed multiple lytic lesions, multiple lytic lesions on skeletal survey - SPEP shows 1.5 g/dL of monoclonal protein, beta-2 microglobulin of 4.8, free lambda light chains of 2869, kappa by lambda ratio of less than 0, elevated LDH. 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  FISH panel shows -14, -12, 13 q.-/-13 and 17p-.  Bone marrow biopsy shows 90% plasma cells. -Cycle 1 of RVD started on 11/01/2017, received IV Cytoxan in the first week secondary to delay of getting Revlimid, finished her 14 days of Revlimid on 11/20/2017.  She tolerated it very well.  Cycle 2 was started on 11/22/2017 with Velcade on days 1, 4, 8, 11 every 21 days.  She does not have any signs of neuropathy.  Cycle 3 was given on 12/13/2017. -She had a consultation with Dr. Laverta Baltimore at [facility] on 12/19/2017.  Upfront bone marrow transplant was recommended after 4 cycles because of her 17 P deletion. - Cycle 4 Velcade was started on 01/03/2018.  Revlimid was started on 01/09/2018.  She has tolerated them very well.  No signs of peripheral neuropathy or other side effects from Revlimid noted. -We discussed the results of myeloma blood work from 01/20/2018.  M spike was undetectable.  Lambda light chains have come down to 27.2.  Free light chain ratio was 0.74.  Serum immunofixation was positive for IgG lambda. -She will come back tomorrow to receive her Velcade and Zometa.  She is going back on Monday to [facility] for pretransplant work-up.  Tentative date of transplant is February 20, 2018.  We will see her back 4 to 6 weeks after transplant.  2.  Low back pain: Her pain completely improved after start of myeloma therapy.  She rarely needs hydrocodone.  3.  Hypercalcemia: Her calcium was elevated at 10.8 on 10/29/2017.  She received  fluids and Zometa.  This is no longer a problem.  She is receiving Zometa every 4 weeks.  4.  ID prophylaxis: She will continue acyclovir twice daily.  She will also continue aspirin 81 mg daily for DVT prophylaxis.

## 2018-01-24 ENCOUNTER — Ambulatory Visit (HOSPITAL_COMMUNITY): Payer: Self-pay | Admitting: Internal Medicine

## 2018-01-24 ENCOUNTER — Inpatient Hospital Stay (HOSPITAL_COMMUNITY): Payer: BLUE CROSS/BLUE SHIELD

## 2018-01-24 ENCOUNTER — Encounter (HOSPITAL_COMMUNITY): Payer: Self-pay

## 2018-01-24 VITALS — BP 135/83 | HR 93 | Temp 98.1°F | Resp 18

## 2018-01-24 DIAGNOSIS — C9000 Multiple myeloma not having achieved remission: Secondary | ICD-10-CM

## 2018-01-24 DIAGNOSIS — Z5112 Encounter for antineoplastic immunotherapy: Secondary | ICD-10-CM | POA: Diagnosis not present

## 2018-01-24 MED ORDER — PROCHLORPERAZINE MALEATE 10 MG PO TABS: 10.0000 mg | ORAL_TABLET | Freq: Once | ORAL | Status: DC

## 2018-01-24 MED ORDER — BORTEZOMIB CHEMO SQ INJECTION 3.5 MG (2.5MG/ML)
1.3000 mg/m2 | Freq: Once | INTRAMUSCULAR | Status: AC
  Administered 2018-01-24: 3 mg via SUBCUTANEOUS
  Filled 2018-01-24: qty 1.2

## 2018-01-24 MED ORDER — SODIUM CHLORIDE 0.9 % IV SOLN
INTRAVENOUS | Status: DC
  Administered 2018-01-24: 12:00:00 via INTRAVENOUS

## 2018-01-24 MED ORDER — ZOLEDRONIC ACID 4 MG/100ML IV SOLN
4.0000 mg | Freq: Once | INTRAVENOUS | Status: AC
  Administered 2018-01-24: 4 mg via INTRAVENOUS
  Filled 2018-01-24: qty 100

## 2018-01-24 NOTE — Progress Notes (Signed)
Joanna Reid tolerated Velcade injection and Zometa infusion well without complaints or incident. Labs reviewed from 7/15 prior to administering these medications and pt denied any tooth or jaw pain and no recent or future dental visits.Pt continues to take her Calcium as prescribed without any issues VSS upon discharge. Pt discharged self ambulatory in satisfactory condition

## 2018-01-24 NOTE — Patient Instructions (Signed)
[facility] Discharge Instructions for Patients Receiving Chemotherapy   Beginning January 23rd 2017 lab work for the [facility] will be done in the  Main lab at [facility] on 1st floor. If you have a lab appointment with the [facility] please come in thru the  Main Entrance and check in at the main information desk   Today you received the following chemotherapy agents Velcade injection as well as Zometa infusion. Follow-up as scheduled. Call clinic for any questions or concerns  To help prevent nausea and vomiting after your treatment, we encourage you to take your nausea medication   If you develop nausea and vomiting, or diarrhea that is not controlled by your medication, call the clinic.  The clinic phone number is [phone]. Office hours are Monday-Friday 8:30am-5:00pm.  BELOW ARE SYMPTOMS THAT SHOULD BE REPORTED IMMEDIATELY:  *FEVER GREATER THAN 101.0 F  *CHILLS WITH OR WITHOUT FEVER  NAUSEA AND VOMITING THAT IS NOT CONTROLLED WITH YOUR NAUSEA MEDICATION  *UNUSUAL SHORTNESS OF BREATH  *UNUSUAL BRUISING OR BLEEDING  TENDERNESS IN MOUTH AND THROAT WITH OR WITHOUT PRESENCE OF ULCERS  *URINARY PROBLEMS  *BOWEL PROBLEMS  UNUSUAL RASH Items with * indicate a potential emergency and should be followed up as soon as possible. If you have an emergency after office hours please contact your primary care physician or go to the nearest emergency department.  Please call the clinic during office hours if you have any questions or concerns.   You may also contact the Patient Navigator at [phone] should you have any questions or need assistance in obtaining follow up care.      Resources For Cancer Patients and their Caregivers ? American Cancer Society: Can assist with transportation, wigs, general needs, runs Look Good Feel Better.        310-598-1600 ? Cancer Care: Provides financial assistance, online support groups, medication/co-pay  assistance.  1-800-813-HOPE (610)052-3991) ? [facility] Assists [place] cancer patients and their families through emotional , educational and financial support.  (343)190-8525 ? [place] DSS Where to apply for food stamps, Medicaid and utility assistance. 620-404-8116 ? RCATS: Transportation to medical appointments. 210-392-9738 ? Social Security Administration: May apply for disability if have a Stage IV cancer. (989)131-3445 212-580-2222 ? [employer], Disability and Transit Services: Assists with nutrition, care and transit needs. 346-624-1307

## 2018-01-25 DIAGNOSIS — C9000 Multiple myeloma not having achieved remission: Secondary | ICD-10-CM | POA: Diagnosis not present

## 2018-01-25 DIAGNOSIS — R739 Hyperglycemia, unspecified: Secondary | ICD-10-CM | POA: Diagnosis not present

## 2018-01-25 DIAGNOSIS — E1165 Type 2 diabetes mellitus with hyperglycemia: Secondary | ICD-10-CM | POA: Diagnosis not present

## 2018-01-25 DIAGNOSIS — E118 Type 2 diabetes mellitus with unspecified complications: Secondary | ICD-10-CM | POA: Diagnosis not present

## 2018-01-27 DIAGNOSIS — M4316 Spondylolisthesis, lumbar region: Secondary | ICD-10-CM | POA: Diagnosis not present

## 2018-01-27 DIAGNOSIS — D704 Cyclic neutropenia: Secondary | ICD-10-CM | POA: Diagnosis not present

## 2018-01-27 DIAGNOSIS — J40 Bronchitis, not specified as acute or chronic: Secondary | ICD-10-CM | POA: Diagnosis not present

## 2018-01-27 DIAGNOSIS — M47816 Spondylosis without myelopathy or radiculopathy, lumbar region: Secondary | ICD-10-CM | POA: Diagnosis not present

## 2018-01-27 DIAGNOSIS — M5134 Other intervertebral disc degeneration, thoracic region: Secondary | ICD-10-CM | POA: Diagnosis not present

## 2018-01-27 DIAGNOSIS — Z6710 Type A blood, Rh positive: Secondary | ICD-10-CM | POA: Diagnosis not present

## 2018-01-27 DIAGNOSIS — M50322 Other cervical disc degeneration at C5-C6 level: Secondary | ICD-10-CM | POA: Diagnosis not present

## 2018-01-27 DIAGNOSIS — C9000 Multiple myeloma not having achieved remission: Secondary | ICD-10-CM | POA: Diagnosis not present

## 2018-01-27 DIAGNOSIS — M4854XA Collapsed vertebra, not elsewhere classified, thoracic region, initial encounter for fracture: Secondary | ICD-10-CM | POA: Diagnosis not present

## 2018-02-04 DIAGNOSIS — I119 Hypertensive heart disease without heart failure: Secondary | ICD-10-CM | POA: Diagnosis not present

## 2018-02-04 DIAGNOSIS — I10 Essential (primary) hypertension: Secondary | ICD-10-CM | POA: Diagnosis not present

## 2018-02-04 DIAGNOSIS — Z87891 Personal history of nicotine dependence: Secondary | ICD-10-CM | POA: Diagnosis not present

## 2018-02-04 DIAGNOSIS — R9431 Abnormal electrocardiogram [ECG] [EKG]: Secondary | ICD-10-CM | POA: Diagnosis not present

## 2018-02-04 DIAGNOSIS — I517 Cardiomegaly: Secondary | ICD-10-CM | POA: Insufficient documentation

## 2018-02-04 DIAGNOSIS — R931 Abnormal findings on diagnostic imaging of heart and coronary circulation: Secondary | ICD-10-CM | POA: Insufficient documentation

## 2018-02-04 DIAGNOSIS — Z6841 Body Mass Index (BMI) 40.0 and over, adult: Secondary | ICD-10-CM | POA: Insufficient documentation

## 2018-02-04 DIAGNOSIS — E119 Type 2 diabetes mellitus without complications: Secondary | ICD-10-CM | POA: Diagnosis not present

## 2018-02-04 DIAGNOSIS — M109 Gout, unspecified: Secondary | ICD-10-CM | POA: Diagnosis not present

## 2018-02-04 DIAGNOSIS — E1165 Type 2 diabetes mellitus with hyperglycemia: Secondary | ICD-10-CM | POA: Diagnosis not present

## 2018-02-04 DIAGNOSIS — C9000 Multiple myeloma not having achieved remission: Secondary | ICD-10-CM | POA: Diagnosis not present

## 2018-02-07 DIAGNOSIS — I10 Essential (primary) hypertension: Secondary | ICD-10-CM | POA: Diagnosis not present

## 2018-02-07 DIAGNOSIS — I517 Cardiomegaly: Secondary | ICD-10-CM | POA: Diagnosis not present

## 2018-02-07 DIAGNOSIS — R931 Abnormal findings on diagnostic imaging of heart and coronary circulation: Secondary | ICD-10-CM | POA: Diagnosis not present

## 2018-02-07 DIAGNOSIS — E1165 Type 2 diabetes mellitus with hyperglycemia: Secondary | ICD-10-CM | POA: Diagnosis not present

## 2018-02-07 DIAGNOSIS — C9001 Multiple myeloma in remission: Secondary | ICD-10-CM | POA: Diagnosis not present

## 2018-02-10 DIAGNOSIS — C9000 Multiple myeloma not having achieved remission: Secondary | ICD-10-CM | POA: Diagnosis not present

## 2018-02-11 DIAGNOSIS — Z6710 Type A blood, Rh positive: Secondary | ICD-10-CM | POA: Diagnosis not present

## 2018-02-11 DIAGNOSIS — Z87440 Personal history of urinary (tract) infections: Secondary | ICD-10-CM | POA: Diagnosis not present

## 2018-02-11 DIAGNOSIS — C9000 Multiple myeloma not having achieved remission: Secondary | ICD-10-CM | POA: Diagnosis not present

## 2018-02-11 DIAGNOSIS — Z52011 Autologous donor, stem cells: Secondary | ICD-10-CM | POA: Diagnosis not present

## 2018-02-12 DIAGNOSIS — Z87440 Personal history of urinary (tract) infections: Secondary | ICD-10-CM | POA: Diagnosis not present

## 2018-02-12 DIAGNOSIS — C9000 Multiple myeloma not having achieved remission: Secondary | ICD-10-CM | POA: Diagnosis not present

## 2018-02-12 DIAGNOSIS — Z52011 Autologous donor, stem cells: Secondary | ICD-10-CM | POA: Diagnosis not present

## 2018-02-17 DIAGNOSIS — Z9484 Stem cells transplant status: Secondary | ICD-10-CM | POA: Diagnosis not present

## 2018-02-17 DIAGNOSIS — R918 Other nonspecific abnormal finding of lung field: Secondary | ICD-10-CM | POA: Diagnosis not present

## 2018-02-17 DIAGNOSIS — C9000 Multiple myeloma not having achieved remission: Secondary | ICD-10-CM | POA: Diagnosis not present

## 2018-02-18 ENCOUNTER — Other Ambulatory Visit: Payer: Self-pay

## 2018-02-18 DIAGNOSIS — C9000 Multiple myeloma not having achieved remission: Secondary | ICD-10-CM | POA: Diagnosis not present

## 2018-02-18 DIAGNOSIS — Z87891 Personal history of nicotine dependence: Secondary | ICD-10-CM | POA: Diagnosis not present

## 2018-02-18 DIAGNOSIS — E119 Type 2 diabetes mellitus without complications: Secondary | ICD-10-CM | POA: Diagnosis not present

## 2018-02-18 DIAGNOSIS — R918 Other nonspecific abnormal finding of lung field: Secondary | ICD-10-CM | POA: Diagnosis not present

## 2018-02-18 DIAGNOSIS — Z6841 Body Mass Index (BMI) 40.0 and over, adult: Secondary | ICD-10-CM | POA: Diagnosis not present

## 2018-02-18 DIAGNOSIS — E1165 Type 2 diabetes mellitus with hyperglycemia: Secondary | ICD-10-CM | POA: Diagnosis not present

## 2018-02-18 DIAGNOSIS — R531 Weakness: Secondary | ICD-10-CM | POA: Diagnosis not present

## 2018-02-18 DIAGNOSIS — N200 Calculus of kidney: Secondary | ICD-10-CM | POA: Diagnosis not present

## 2018-02-18 DIAGNOSIS — Z794 Long term (current) use of insulin: Secondary | ICD-10-CM | POA: Diagnosis not present

## 2018-02-18 DIAGNOSIS — M7989 Other specified soft tissue disorders: Secondary | ICD-10-CM | POA: Diagnosis not present

## 2018-02-18 DIAGNOSIS — Z87442 Personal history of urinary calculi: Secondary | ICD-10-CM | POA: Diagnosis not present

## 2018-02-18 DIAGNOSIS — B029 Zoster without complications: Secondary | ICD-10-CM | POA: Diagnosis not present

## 2018-02-18 DIAGNOSIS — D709 Neutropenia, unspecified: Secondary | ICD-10-CM | POA: Diagnosis not present

## 2018-02-18 DIAGNOSIS — I10 Essential (primary) hypertension: Secondary | ICD-10-CM | POA: Diagnosis not present

## 2018-02-18 DIAGNOSIS — Z9481 Bone marrow transplant status: Secondary | ICD-10-CM | POA: Diagnosis not present

## 2018-02-18 DIAGNOSIS — M545 Low back pain: Secondary | ICD-10-CM | POA: Diagnosis not present

## 2018-02-18 DIAGNOSIS — R5081 Fever presenting with conditions classified elsewhere: Secondary | ICD-10-CM | POA: Diagnosis not present

## 2018-02-18 DIAGNOSIS — R110 Nausea: Secondary | ICD-10-CM | POA: Diagnosis not present

## 2018-02-18 DIAGNOSIS — K219 Gastro-esophageal reflux disease without esophagitis: Secondary | ICD-10-CM | POA: Diagnosis not present

## 2018-02-18 DIAGNOSIS — K760 Fatty (change of) liver, not elsewhere classified: Secondary | ICD-10-CM | POA: Diagnosis not present

## 2018-02-18 DIAGNOSIS — M109 Gout, unspecified: Secondary | ICD-10-CM | POA: Diagnosis not present

## 2018-02-18 DIAGNOSIS — C9001 Multiple myeloma in remission: Secondary | ICD-10-CM | POA: Diagnosis not present

## 2018-02-18 DIAGNOSIS — Z452 Encounter for adjustment and management of vascular access device: Secondary | ICD-10-CM | POA: Diagnosis not present

## 2018-02-18 DIAGNOSIS — E118 Type 2 diabetes mellitus with unspecified complications: Secondary | ICD-10-CM | POA: Diagnosis not present

## 2018-02-18 DIAGNOSIS — B009 Herpesviral infection, unspecified: Secondary | ICD-10-CM | POA: Diagnosis not present

## 2018-02-18 DIAGNOSIS — R739 Hyperglycemia, unspecified: Secondary | ICD-10-CM | POA: Diagnosis not present

## 2018-02-18 DIAGNOSIS — Z9484 Stem cells transplant status: Secondary | ICD-10-CM | POA: Diagnosis not present

## 2018-02-18 MED ORDER — VERAPAMIL HCL ER 240 MG PO TBCR: 240.0000 mg | EXTENDED_RELEASE_TABLET | Freq: Every day | ORAL | 1 refills | Status: DC

## 2018-02-18 MED ORDER — METFORMIN HCL 500 MG PO TABS: ORAL_TABLET | ORAL | 1 refills | Status: DC

## 2018-02-18 MED ORDER — ALLOPURINOL 300 MG PO TABS: ORAL_TABLET | ORAL | 1 refills | Status: DC

## 2018-02-18 MED ORDER — OMEPRAZOLE 20 MG PO CPDR: 20.0000 mg | DELAYED_RELEASE_CAPSULE | Freq: Every day | ORAL | 1 refills | Status: DC

## 2018-02-20 DIAGNOSIS — E118 Type 2 diabetes mellitus with unspecified complications: Secondary | ICD-10-CM | POA: Diagnosis not present

## 2018-02-20 DIAGNOSIS — R739 Hyperglycemia, unspecified: Secondary | ICD-10-CM | POA: Diagnosis not present

## 2018-02-20 DIAGNOSIS — E1165 Type 2 diabetes mellitus with hyperglycemia: Secondary | ICD-10-CM | POA: Diagnosis not present

## 2018-02-26 DIAGNOSIS — E1165 Type 2 diabetes mellitus with hyperglycemia: Secondary | ICD-10-CM | POA: Diagnosis not present

## 2018-03-01 DIAGNOSIS — Z9481 Bone marrow transplant status: Secondary | ICD-10-CM | POA: Diagnosis not present

## 2018-03-01 DIAGNOSIS — R918 Other nonspecific abnormal finding of lung field: Secondary | ICD-10-CM | POA: Diagnosis not present

## 2018-03-01 DIAGNOSIS — D709 Neutropenia, unspecified: Secondary | ICD-10-CM | POA: Diagnosis not present

## 2018-03-01 DIAGNOSIS — Z9484 Stem cells transplant status: Secondary | ICD-10-CM | POA: Diagnosis not present

## 2018-03-01 DIAGNOSIS — Z6841 Body Mass Index (BMI) 40.0 and over, adult: Secondary | ICD-10-CM | POA: Diagnosis not present

## 2018-03-01 DIAGNOSIS — R531 Weakness: Secondary | ICD-10-CM | POA: Diagnosis not present

## 2018-03-01 DIAGNOSIS — K219 Gastro-esophageal reflux disease without esophagitis: Secondary | ICD-10-CM | POA: Diagnosis not present

## 2018-03-01 DIAGNOSIS — R5081 Fever presenting with conditions classified elsewhere: Secondary | ICD-10-CM | POA: Diagnosis not present

## 2018-03-01 DIAGNOSIS — I10 Essential (primary) hypertension: Secondary | ICD-10-CM | POA: Diagnosis not present

## 2018-03-01 DIAGNOSIS — K760 Fatty (change of) liver, not elsewhere classified: Secondary | ICD-10-CM | POA: Diagnosis not present

## 2018-03-01 DIAGNOSIS — M109 Gout, unspecified: Secondary | ICD-10-CM | POA: Diagnosis not present

## 2018-03-01 DIAGNOSIS — C9001 Multiple myeloma in remission: Secondary | ICD-10-CM | POA: Diagnosis not present

## 2018-03-01 DIAGNOSIS — C9000 Multiple myeloma not having achieved remission: Secondary | ICD-10-CM | POA: Diagnosis not present

## 2018-03-01 DIAGNOSIS — E119 Type 2 diabetes mellitus without complications: Secondary | ICD-10-CM | POA: Diagnosis not present

## 2018-03-01 DIAGNOSIS — B009 Herpesviral infection, unspecified: Secondary | ICD-10-CM | POA: Diagnosis not present

## 2018-03-01 DIAGNOSIS — B029 Zoster without complications: Secondary | ICD-10-CM | POA: Diagnosis not present

## 2018-03-01 DIAGNOSIS — Z794 Long term (current) use of insulin: Secondary | ICD-10-CM | POA: Diagnosis not present

## 2018-03-02 DIAGNOSIS — D709 Neutropenia, unspecified: Secondary | ICD-10-CM | POA: Diagnosis not present

## 2018-03-02 DIAGNOSIS — Z9481 Bone marrow transplant status: Secondary | ICD-10-CM | POA: Diagnosis not present

## 2018-03-02 DIAGNOSIS — R5081 Fever presenting with conditions classified elsewhere: Secondary | ICD-10-CM | POA: Diagnosis not present

## 2018-03-02 DIAGNOSIS — C9000 Multiple myeloma not having achieved remission: Secondary | ICD-10-CM | POA: Diagnosis not present

## 2018-03-03 DIAGNOSIS — C9000 Multiple myeloma not having achieved remission: Secondary | ICD-10-CM | POA: Diagnosis not present

## 2018-03-03 DIAGNOSIS — B009 Herpesviral infection, unspecified: Secondary | ICD-10-CM | POA: Diagnosis not present

## 2018-03-03 DIAGNOSIS — D709 Neutropenia, unspecified: Secondary | ICD-10-CM | POA: Diagnosis not present

## 2018-03-03 DIAGNOSIS — Z9481 Bone marrow transplant status: Secondary | ICD-10-CM | POA: Diagnosis not present

## 2018-03-04 DIAGNOSIS — Z9481 Bone marrow transplant status: Secondary | ICD-10-CM | POA: Diagnosis not present

## 2018-03-04 DIAGNOSIS — D709 Neutropenia, unspecified: Secondary | ICD-10-CM | POA: Diagnosis not present

## 2018-03-04 DIAGNOSIS — B009 Herpesviral infection, unspecified: Secondary | ICD-10-CM | POA: Diagnosis not present

## 2018-03-04 DIAGNOSIS — C9000 Multiple myeloma not having achieved remission: Secondary | ICD-10-CM | POA: Diagnosis not present

## 2018-03-04 MED ORDER — DEXTROSE 50 % IV SOLN
12.50 | INTRAVENOUS | Status: DC
Start: ? — End: 2018-03-04

## 2018-03-04 MED ORDER — CARVEDILOL 6.25 MG PO TABS
6.25 | ORAL_TABLET | ORAL | Status: DC
Start: 2018-03-04 — End: 2018-03-04

## 2018-03-04 MED ORDER — INSULIN GLARGINE 100 UNIT/ML ~~LOC~~ SOLN
15.00 | SUBCUTANEOUS | Status: DC
Start: 2018-03-04 — End: 2018-03-04

## 2018-03-04 MED ORDER — FEXOFENADINE HCL 60 MG PO TABS
30.00 | ORAL_TABLET | ORAL | Status: DC
Start: ? — End: 2018-03-04

## 2018-03-04 MED ORDER — GENERIC EXTERNAL MEDICATION
Status: DC
Start: ? — End: 2018-03-04

## 2018-03-04 MED ORDER — GENERIC EXTERNAL MEDICATION
10.00 | Status: DC
Start: ? — End: 2018-03-04

## 2018-03-04 MED ORDER — ALLOPURINOL 300 MG PO TABS
300.00 | ORAL_TABLET | ORAL | Status: DC
Start: 2018-03-05 — End: 2018-03-04

## 2018-03-04 MED ORDER — SODIUM CHLORIDE 0.9 % IV SOLN
INTRAVENOUS | Status: DC
Start: ? — End: 2018-03-04

## 2018-03-04 MED ORDER — FLUCONAZOLE 200 MG PO TABS
400.00 | ORAL_TABLET | ORAL | Status: DC
Start: 2018-03-05 — End: 2018-03-04

## 2018-03-04 MED ORDER — INSULIN LISPRO 100 UNIT/ML ~~LOC~~ SOLN
0.00 | SUBCUTANEOUS | Status: DC
Start: 2018-03-04 — End: 2018-03-04

## 2018-03-04 MED ORDER — GENERIC EXTERNAL MEDICATION
1.00 | Status: DC
Start: ? — End: 2018-03-04

## 2018-03-04 MED ORDER — DOCOSANOL 10 % EX CREA
1.00 | TOPICAL_CREAM | CUTANEOUS | Status: DC
Start: 2018-03-04 — End: 2018-03-04

## 2018-03-04 MED ORDER — GENERIC EXTERNAL MEDICATION
8.00 | Status: DC
Start: ? — End: 2018-03-04

## 2018-03-04 MED ORDER — FLUTICASONE PROPIONATE 50 MCG/ACT NA SUSP
2.00 | NASAL | Status: DC
Start: 2018-03-04 — End: 2018-03-04

## 2018-03-04 MED ORDER — LOSARTAN POTASSIUM 50 MG PO TABS
100.00 | ORAL_TABLET | ORAL | Status: DC
Start: 2018-03-05 — End: 2018-03-04

## 2018-03-04 MED ORDER — VERAPAMIL HCL ER 240 MG PO TBCR
240.00 | EXTENDED_RELEASE_TABLET | ORAL | Status: DC
Start: 2018-03-04 — End: 2018-03-04

## 2018-03-04 MED ORDER — GENERIC EXTERNAL MEDICATION
10.00 | Status: DC
Start: 2018-03-04 — End: 2018-03-04

## 2018-03-04 MED ORDER — GENERIC EXTERNAL MEDICATION
1.50 | Status: DC
Start: 2018-03-04 — End: 2018-03-04

## 2018-03-04 MED ORDER — GENERIC EXTERNAL MEDICATION
2.00 | Status: DC
Start: 2018-03-04 — End: 2018-03-04

## 2018-03-04 MED ORDER — TBO-FILGRASTIM 300 MCG/0.5ML ~~LOC~~ SOSY
600.00 | PREFILLED_SYRINGE | SUBCUTANEOUS | Status: DC
Start: 2018-03-05 — End: 2018-03-04

## 2018-03-04 MED ORDER — LORAZEPAM 1 MG PO TABS
1.00 | ORAL_TABLET | ORAL | Status: DC
Start: ? — End: 2018-03-04

## 2018-03-04 MED ORDER — LOPERAMIDE HCL 2 MG PO CAPS
4.00 | ORAL_CAPSULE | ORAL | Status: DC
Start: ? — End: 2018-03-04

## 2018-03-04 MED ORDER — PANTOPRAZOLE SODIUM 40 MG PO TBEC
40.00 | DELAYED_RELEASE_TABLET | ORAL | Status: DC
Start: 2018-03-05 — End: 2018-03-04

## 2018-03-04 MED ORDER — OXYCODONE HCL 5 MG PO TABS
5.00 | ORAL_TABLET | ORAL | Status: DC
Start: ? — End: 2018-03-04

## 2018-03-05 DIAGNOSIS — C9000 Multiple myeloma not having achieved remission: Secondary | ICD-10-CM | POA: Diagnosis not present

## 2018-03-05 DIAGNOSIS — D709 Neutropenia, unspecified: Secondary | ICD-10-CM | POA: Diagnosis not present

## 2018-03-05 DIAGNOSIS — Z87442 Personal history of urinary calculi: Secondary | ICD-10-CM | POA: Diagnosis not present

## 2018-03-05 DIAGNOSIS — Z9484 Stem cells transplant status: Secondary | ICD-10-CM | POA: Diagnosis not present

## 2018-03-07 DIAGNOSIS — Z452 Encounter for adjustment and management of vascular access device: Secondary | ICD-10-CM | POA: Diagnosis not present

## 2018-03-07 DIAGNOSIS — C9000 Multiple myeloma not having achieved remission: Secondary | ICD-10-CM | POA: Diagnosis not present

## 2018-03-07 DIAGNOSIS — Z9484 Stem cells transplant status: Secondary | ICD-10-CM | POA: Diagnosis not present

## 2018-03-14 ENCOUNTER — Inpatient Hospital Stay (HOSPITAL_BASED_OUTPATIENT_CLINIC_OR_DEPARTMENT_OTHER): Payer: BLUE CROSS/BLUE SHIELD | Admitting: Hematology

## 2018-03-14 ENCOUNTER — Encounter (HOSPITAL_COMMUNITY): Payer: Self-pay | Admitting: Hematology

## 2018-03-14 ENCOUNTER — Inpatient Hospital Stay (HOSPITAL_COMMUNITY): Payer: BLUE CROSS/BLUE SHIELD | Attending: Hematology

## 2018-03-14 VITALS — BP 159/93 | HR 84 | Temp 98.2°F | Resp 18 | Wt 263.0 lb

## 2018-03-14 DIAGNOSIS — M7989 Other specified soft tissue disorders: Secondary | ICD-10-CM | POA: Insufficient documentation

## 2018-03-14 DIAGNOSIS — M545 Low back pain: Secondary | ICD-10-CM | POA: Diagnosis not present

## 2018-03-14 DIAGNOSIS — E119 Type 2 diabetes mellitus without complications: Secondary | ICD-10-CM | POA: Insufficient documentation

## 2018-03-14 DIAGNOSIS — Z9484 Stem cells transplant status: Secondary | ICD-10-CM

## 2018-03-14 DIAGNOSIS — N200 Calculus of kidney: Secondary | ICD-10-CM | POA: Diagnosis not present

## 2018-03-14 DIAGNOSIS — R110 Nausea: Secondary | ICD-10-CM | POA: Insufficient documentation

## 2018-03-14 DIAGNOSIS — C9000 Multiple myeloma not having achieved remission: Secondary | ICD-10-CM | POA: Diagnosis not present

## 2018-03-14 DIAGNOSIS — Z87891 Personal history of nicotine dependence: Secondary | ICD-10-CM | POA: Diagnosis not present

## 2018-03-14 LAB — CBC WITH DIFFERENTIAL/PLATELET
BASOS PCT: 1 %
Basophils Absolute: 0 10*3/uL (ref 0.0–0.1)
EOS ABS: 0 10*3/uL (ref 0.0–0.7)
EOS PCT: 1 %
HCT: 32 % — ABNORMAL LOW (ref 36.0–46.0)
Hemoglobin: 10.7 g/dL — ABNORMAL LOW (ref 12.0–15.0)
LYMPHS PCT: 42 %
Lymphs Abs: 1.5 10*3/uL (ref 0.7–4.0)
MCH: 30.2 pg (ref 26.0–34.0)
MCHC: 33.4 g/dL (ref 30.0–36.0)
MCV: 90.4 fL (ref 78.0–100.0)
MONO ABS: 0.7 10*3/uL (ref 0.1–1.0)
MONOS PCT: 21 %
Neutro Abs: 1.3 10*3/uL — ABNORMAL LOW (ref 1.7–7.7)
Neutrophils Relative %: 35 %
Platelets: 188 10*3/uL (ref 150–400)
RBC: 3.54 MIL/uL — ABNORMAL LOW (ref 3.87–5.11)
RDW: 16.7 % — AB (ref 11.5–15.5)
WBC: 3.6 10*3/uL — AB (ref 4.0–10.5)

## 2018-03-14 LAB — COMPREHENSIVE METABOLIC PANEL
ALBUMIN: 3.7 g/dL (ref 3.5–5.0)
ALK PHOS: 57 U/L (ref 38–126)
ALT: 22 U/L (ref 0–44)
AST: 32 U/L (ref 15–41)
Anion gap: 9 (ref 5–15)
BUN: 10 mg/dL (ref 6–20)
CALCIUM: 9 mg/dL (ref 8.9–10.3)
CO2: 24 mmol/L (ref 22–32)
Chloride: 107 mmol/L (ref 98–111)
Creatinine, Ser: 0.69 mg/dL (ref 0.44–1.00)
GFR calc non Af Amer: >60 mL/min (ref >60)
GLUCOSE: 195 mg/dL — AB (ref 70–99)
POTASSIUM: 3.7 mmol/L (ref 3.5–5.1)
SODIUM: 140 mmol/L (ref 135–145)
TOTAL PROTEIN: 6.2 g/dL — AB (ref 6.5–8.1)
Total Bilirubin: 0.6 mg/dL (ref 0.3–1.2)

## 2018-03-14 LAB — LACTATE DEHYDROGENASE: LDH: 228 U/L — ABNORMAL HIGH (ref 98–192)

## 2018-03-14 NOTE — Assessment & Plan Note (Signed)
1.  IgG lambda multiple myeloma, stage III, del 17p: - Presentation with lower back pain since March, presented to the emergency room on 10/25/2017 with right groin pain, CT scan showed multiple lytic lesions, multiple lytic lesions on skeletal survey - SPEP shows 1.5 g/dL of monoclonal protein, beta-2 microglobulin of 4.8, free lambda light chains of 2869, kappa by lambda ratio of less than 0, elevated LDH. 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  FISH panel shows -14, -12, 13 q.-/-13 and 17p-.  Bone marrow biopsy shows 90% plasma cells. -Cycle 1 of RVD started on 11/01/2017. -She had a consultation with Dr. Laverta Baltimore at [facility] on 12/19/2017.  Upfront bone marrow transplant was recommended after 4 cycles because of her 17 P deletion. - Cycle 4 Velcade was started on 01/03/2018.  Revlimid was started on 01/09/2018.  She has tolerated them very well.  No signs of peripheral neuropathy or other side effects from Revlimid noted. -She completed cycle 4 on 01/14/2018.  She received 1 more additional dose of Velcade on 01/24/2018.  Labs done on 01/20/2018 showed M spike to be negative. - She was evaluated by Dr. Laverta Baltimore at [facility].  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  She was discharged home on 03/07/2018.  Her energy levels are improving. - Today we discussed the results of her CBC.  Her platelet count recovered to normal.  ANC was slightly low at 1300.  Hemoglobin is improved to 10.8.  She has an appointment at [facility] on 03/31/2018.  I will see her back in 6 weeks for follow-up and blood work.  She was told to call us sooner if she develops any problems.  2.  Low back pain: Her low back pain completely improved after starting myeloma therapy.  She is not taking any pain medicine.  3.   Hypercalcemia: Her calcium was elevated at 10.8 on 10/29/2017.  She received fluids and Zometa.  This is no longer a problem.  She is receiving Zometa every 4 weeks.  Zometa is currently on hold due to transplant.  4.  ID prophylaxis: She is currently receiving acyclovir 800 mg twice daily.  Her aspirin has been on hold since transplant.

## 2018-03-14 NOTE — Patient Instructions (Addendum)
[facility] at [facility] Discharge Instructions  You saw Dr. Delton Coombes today. Follow up in 6 weeks with labs.   Thank you for choosing [place] at [facility] to provide your oncology and hematology care.  To afford each patient quality time with our provider, please arrive at least 15 minutes before your scheduled appointment time.   If you have a lab appointment with the [facility] please come in thru the  Main Entrance and check in at the main information desk  You need to re-schedule your appointment should you arrive 10 or more minutes late.  We strive to give you quality time with our providers, and arriving late affects you and other patients whose appointments are after yours.  Also, if you no show three or more times for appointments you may be dismissed from the clinic at the providers discretion.     Again, thank you for choosing [facility].  Our hope is that these requests will decrease the amount of time that you wait before being seen by our physicians.       _____________________________________________________________  Should you have questions after your visit to [facility], please contact our office at [phone] between the hours of 8:00 a.m. and 4:30 p.m.  Voicemails left after 4:00 p.m. will not be returned until the following business day.  For prescription refill requests, have your pharmacy contact our office and allow 72 hours.    Cancer Center Support Programs:   > Cancer Support Group  2nd Tuesday of the month 1pm-2pm, Journey Room

## 2018-03-14 NOTE — Progress Notes (Signed)
[place], [place] [address]   CLINIC:  Medical Oncology/Hematology  PCP:  Mikey Kirschner, [place] [address] [phone]   REASON FOR VISIT: Follow-up for multiple myeloma  CURRENT THERAPY: S/P transplant will start maintance in 100 days from transplant  BRIEF ONCOLOGIC HISTORY:    Multiple myeloma ([facility])   10/29/2017 Initial Diagnosis    Multiple myeloma ([facility])    11/01/2017 -  Chemotherapy    The patient had bortezomib SQ (VELCADE) chemo injection 3 mg, 1.3 mg/m2 = 3 mg, Subcutaneous,  Once, 5 of 5 cycles Administration: 3 mg (11/01/2017), 3 mg (11/08/2017), 3 mg (11/05/2017), 3 mg (11/22/2017), 3 mg (11/12/2017), 3 mg (11/29/2017), 3 mg (11/26/2017), 3 mg (12/03/2017), 3 mg (12/13/2017), 3 mg (12/20/2017), 3 mg (12/17/2017), 3 mg (12/24/2017), 3 mg (01/03/2018), 3 mg (01/10/2018), 3 mg (01/07/2018), 3 mg (01/14/2018), 3 mg (01/24/2018)  for chemotherapy treatment.       CANCER STAGING: Cancer Staging Multiple myeloma ([place]) Staging form: Plasma Cell Myeloma and Plasma Cell Disorders, AJCC 8th Edition - Clinical: No stage assigned - Unsigned    INTERVAL HISTORY:  Ms. Friscia 52 y.o. female returns for routine follow-up for multiple myeloma. Patient is here today doing well after her recent transplant. She reports nausea most of the day. She is taking zofran and it helps some. She reports her weakness and fatigue are improving everyday. She still has bilateral lower leg swelling. They stopped her lasix at [facility] She reports her appetite at 50% and her energy level at 25%. Denies any night sweats, fevers, or chills. Denies any new pains or lumps. Denies any vomiting or diarrhea.    REVIEW OF SYSTEMS:  Review of Systems  Constitutional: Positive for fatigue.  Cardiovascular: Positive for leg swelling.  Gastrointestinal: Positive for nausea.  Neurological: Positive for extremity weakness.  All other systems reviewed and are  negative.    PAST MEDICAL/SURGICAL HISTORY:  Past Medical History:  Diagnosis Date  . Acid reflux   . Allergic rhinitis   . Diabetes mellitus    type 2  . Gout   . Gout   . HBP (high blood pressure)   . History of kidney stones   . Migraines    Past Surgical History:  Procedure Laterality Date  . CESAREAN SECTION    . EXTRACORPOREAL SHOCK WAVE LITHOTRIPSY Left 10/10/2017   Procedure: LEFT EXTRACORPOREAL SHOCK WAVE LITHOTRIPSY (ESWL);  Surgeon: Bjorn Loser, MD;  Location: [facility];  Service: Urology;  Laterality: Left;  . EYE SURGERY    . HEMORRHOID SURGERY N/A 11/19/2012   Procedure: HEMORRHOIDECTOMY;  Surgeon: Jamesetta So, MD;  Location: [facility];  Service: General;  Laterality: N/A;  . kidney stones  1998  . LAPAROSCOPIC UNILATERAL SALPINGO OOPHERECTOMY  05/14/2012   Procedure: LAPAROSCOPIC UNILATERAL SALPINGO OOPHORECTOMY;  Surgeon: [name], MD;  Location: [facility];  Service: Gynecology;  Laterality: Right;  laparoscopic right salpingo-oophorectomy  . PARTIAL HYSTERECTOMY    . TONSILECTOMY, ADENOIDECTOMY, BILATERAL MYRINGOTOMY AND TUBES    . VESICOVAGINAL FISTULA CLOSURE W/ TAH       SOCIAL HISTORY:  Social History   Socioeconomic History  . Marital status: Married    Spouse name: Not on file  . Number of children: Not on file  . Years of education: 77  . Highest education level: Not on file  Occupational History    Employer: [employer]  . Financial resource strain:  Not on file  . Food insecurity:    Worry: Never true    Inability: Never true  . Transportation needs:    Medical: Not on file    Non-medical: Not on file  Tobacco Use  . Smoking status: Former Smoker    Last attempt to quit: 02/27/1999    Years since quitting: 19.0  . Smokeless tobacco: Never Used  . Tobacco comment: socially   Substance and Sexual Activity  . Alcohol use: No    Alcohol/week: 0.0 standard drinks  . Drug use: No  . Sexual activity: Yes  Lifestyle  . Physical  activity:    Days per week: Not on file    Minutes per session: Not on file  . Stress: Not on file  Relationships  . Social connections:    Talks on phone: Not on file    Gets together: Not on file    Attends religious service: Not on file    Active member of club or organization: Not on file    Attends meetings of clubs or organizations: Not on file    Relationship status: Not on file  . Intimate partner violence:    Fear of current or ex partner: Not on file    Emotionally abused: Not on file    Physically abused: Not on file    Forced sexual activity: Not on file  Other Topics Concern  . Not on file  Social History Narrative  . Not on file    FAMILY HISTORY:  Family History  Problem Relation Age of Onset  . Arthritis Unknown   . Cancer Unknown   . Diabetes Unknown   . Hypertension Mother   . Dementia Mother   . Diabetes Father   . ALS Father   . Diabetes Brother   . Hypertension Brother   . Cancer Paternal Aunt   . COPD Maternal Grandmother   . Cancer Maternal Grandfather   . Anesthesia problems Paternal Grandfather     CURRENT MEDICATIONS:  Outpatient Encounter Medications as of 03/14/2018  Medication Sig Note  . acyclovir (ZOVIRAX) 400 MG tablet Take 1 tablet (400 mg total) by mouth 2 (two) times daily.   Marland Kitchen albuterol (PROVENTIL HFA;VENTOLIN HFA) 108 (90 Base) MCG/ACT inhaler Inhale 1-2 puffs into the lungs daily as needed.   Marland Kitchen albuterol (PROVENTIL HFA;VENTOLIN HFA) 108 (90 Base) MCG/ACT inhaler Inhale into the lungs.   Marland Kitchen allopurinol (ZYLOPRIM) 300 MG tablet TAKE 1 TABLET BY MOUTH  DAILY FOR GOUT   . aspirin EC 81 MG tablet Take 81 mg by mouth daily.   . bumetanide (BUMEX) 1 MG tablet Take 1 tablet (1 mg total) by mouth daily.   . Calcium Carb-Cholecalciferol (CALCIUM 600+D3 PO) Take by mouth. 11/08/2017: Calcium 671m with Vitamin D 1,000 units BID  . carvedilol (COREG) 6.25 MG tablet Take by mouth.   . ciprofloxacin (CIPRO) 500 MG tablet    . dexamethasone  (DECADRON) 4 MG tablet Take 10 tablets (40 mg total) by mouth once a week.   . diazepam (VALIUM) 5 MG tablet Take one qhs prn anxiety or insomnia.   . Docosanol 10 % CREA Apply topically.   . docusate sodium (COLACE) 100 MG capsule Take by mouth.   . fluconazole (DIFLUCAN) 200 MG tablet    . fluticasone (FLONASE) 50 MCG/ACT nasal spray Place 2 sprays into the nose 2 (two) times daily.   .Marland Kitchenglucose blood (PRECISION QID TEST) test strip Use 1 each (1 strip total) 4 (four)  times daily Use as instructed. OneTouch Verio   . HUMALOG KWIKPEN 100 UNIT/ML KiwkPen INJECT THREE TIMES DAILY BEFORE MEALS PER CORRECTION SCALE. MAX DOSE PER DAY IS 35 UNITS   . HYDROcodone-acetaminophen (NORCO/VICODIN) 5-325 MG tablet Take 1 tablet by mouth every 6 (six) hours as needed for moderate pain.   . Insulin Glargine (LANTUS SOLOSTAR) 100 UNIT/ML Solostar Pen Inject into the skin.   . Lactulose 20 GM/30ML SOLN Take 19m by mouth every 3-4 hours until bowel movement. Then take 388mby mouth daily.   . Lancets Misc. (UNISTIK 2 NORMAL) MISC Use 1 each 4 (four) times daily Use as instructed.   . Marland Kitchenenalidomide (REVLIMID) 25 MG capsule Take 1 capsule by mouth daily x 14 days in a row followed by a 7 day rest period. Then resume regimen.   . Marland Kitchenosartan (COZAAR) 100 MG tablet Take 1 tablet (100 mg total) by mouth daily.   . Marland Kitchenosartan (COZAAR) 100 MG tablet TAKE 1 TABLET BY MOUTH  DAILY   . meloxicam (MOBIC) 15 MG tablet TAKE 1 TABLET BY MOUTH ONCE DAILY   . metFORMIN (GLUCOPHAGE) 500 MG tablet TAKE 1 TABLET BY MOUTH ONCE DAILY WITH BREAKFAST   . nitrofurantoin, macrocrystal-monohydrate, (MACROBID) 100 MG capsule Take 100 mg by mouth daily.    . Marland Kitchenmeprazole (PRILOSEC) 20 MG capsule Take 1 capsule (20 mg total) by mouth daily.   . ondansetron (ZOFRAN) 8 MG tablet Take 1 tablet (8 mg total) by mouth every 8 (eight) hours as needed.   . Glory RosebushERIO test strip USE 1 STRIP TO CHECK GLUCOSE 4 TIMES DAILY AS DIRECTED   . polyethylene  glycol powder (GLYCOLAX/MIRALAX) powder Take 1 Container by mouth daily.   . prochlorperazine (COMPAZINE) 10 MG tablet Take 1 tablet (10 mg total) by mouth every 6 (six) hours as needed for nausea or vomiting.   . verapamil (CALAN-SR) 240 MG CR tablet Take 1 tablet (240 mg total) by mouth at bedtime.   . Vitamin D, Ergocalciferol, (DRISDOL) 50000 units CAPS capsule    . ZARXIO 300 MCG/0.5ML SOSY    . ZARXIO 480 MCG/0.8ML SOSY     No facility-administered encounter medications on file as of 03/14/2018.     ALLERGIES:  No Known Allergies   PHYSICAL EXAM:  ECOG Performance status: 1  Vitals:   03/14/18 0831  BP: (!) 159/93  Pulse: 84  Resp: 18  Temp: 98.2 F (36.8 C)  SpO2: 94%   Filed Weights   03/14/18 0831  Weight: 263 lb (119.3 kg)    Physical Exam  Constitutional: She is oriented to person, place, and time. She appears well-developed and well-nourished.  Cardiovascular: Normal rate, regular rhythm and normal heart sounds.  Pulmonary/Chest: Effort normal and breath sounds normal.  Abdominal: Soft.  Neurological: She is alert and oriented to person, place, and time.  Skin: Skin is warm and dry.  Psychiatric: She has a normal mood and affect. Her behavior is normal. Judgment and thought content normal.     LABORATORY DATA:  I have reviewed the labs as listed.  CBC    Component Value Date/Time   WBC 3.6 (L) 03/14/2018 0800   RBC 3.54 (L) 03/14/2018 0800   HGB 10.7 (L) 03/14/2018 0800   HGB 14.2 08/02/2015 1113   HCT 32.0 (L) 03/14/2018 0800   HCT 41.1 08/02/2015 1113   PLT 188 03/14/2018 0800   PLT 371 08/02/2015 1113   MCV 90.4 03/14/2018 0800   MCV 84 08/02/2015 1113  MCH 30.2 03/14/2018 0800   MCHC 33.4 03/14/2018 0800   RDW 16.7 (H) 03/14/2018 0800   RDW 15.1 08/02/2015 1113   LYMPHSABS 1.5 03/14/2018 0800   LYMPHSABS 2.9 08/02/2015 1113   MONOABS 0.7 03/14/2018 0800   EOSABS 0.0 03/14/2018 0800   EOSABS 0.2 08/02/2015 1113   BASOSABS 0.0 03/14/2018  0800   BASOSABS 0.1 08/02/2015 1113   CMP Latest Ref Rng & Units 03/14/2018 01/20/2018 01/14/2018  Glucose 70 - 99 mg/dL 195(H) 288(H) 280(H)  BUN 6 - 20 mg/dL 10 24(H) 19  Creatinine 0.44 - 1.00 mg/dL 0.69 0.97 1.00  Sodium 135 - 145 mmol/L 140 140 137  Potassium 3.5 - 5.1 mmol/L 3.7 3.1(L) 3.5  Chloride 98 - 111 mmol/L 107 102 100  CO2 22 - 32 mmol/L _0 Calcium 8.9 - 10.3 mg/dL 9.0 9.6 9.6  Total Protein 6.5 - 8.1 g/dL 6.2(L) 6.8 6.7  Total Bilirubin 0.3 - 1.2 mg/dL 0.6 0.9 1.0  Alkaline Phos 38 - 126 U/L 57 106 134(H)  AST 15 - 41 U/L 32 57(H) 64(H)  ALT 0 - 44 U/L 22 62(H) 68(H)        ASSESSMENT & PLAN:   Multiple myeloma ([facility]) 1.  IgG lambda multiple myeloma, stage III, del 17p: - Presentation with lower back pain since March, presented to the emergency room on 10/25/2017 with right groin pain, CT scan showed multiple lytic lesions, multiple lytic lesions on skeletal survey - SPEP shows 1.5 g/dL of monoclonal protein, beta-2 microglobulin of 4.8, free lambda light chains of 2869, kappa by lambda ratio of less than 0, elevated LDH. 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  FISH panel shows -14, -12, 13 q.-/-13 and 17p-.  Bone marrow biopsy shows 90% plasma cells. -Cycle 1 of RVD started on 11/01/2017. -She had a consultation with Dr. Laverta Baltimore at [facility] on 12/19/2017.  Upfront bone marrow transplant was recommended after 4 cycles because of her 17 P deletion. - Cycle 4 Velcade was started on 01/03/2018.  Revlimid was started on 01/09/2018.  She has tolerated them very well.  No signs of peripheral neuropathy or other side effects from Revlimid noted. -She completed cycle 4 on 01/14/2018.  She received 1 more additional dose of Velcade on 01/24/2018.  Labs done on 01/20/2018 showed M spike to be negative. - She was evaluated by Dr. Laverta Baltimore at [facility].  She received melphalan 200 mg/m on 02/19/2018 followed by auto stem cell rescue on  02/20/2018. 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  She was discharged home on 03/07/2018.  Her energy levels are improving. - Today we discussed the results of her CBC.  Her platelet count recovered to normal.  ANC was slightly low at 1300.  Hemoglobin is improved to 10.8.  She has an appointment at [facility] on 03/31/2018.  I will see her back in 6 weeks for follow-up and blood work.  She was told to call us sooner if she develops any problems.  2.  Low back pain: Her low back pain completely improved after starting myeloma therapy.  She is not taking any pain medicine.  3.  Hypercalcemia: Her calcium was elevated at 10.8 on 10/29/2017.  She received fluids and Zometa.  This is no longer a problem.  She is receiving Zometa every 4 weeks.  Zometa is currently on hold due to transplant.  4.  ID prophylaxis: She is currently receiving acyclovir 800 mg twice daily.  Her aspirin has been on hold since transplant.      Orders placed this encounter:  Orders Placed This Encounter  Procedures  . CBC with Differential/Platelet  . Comprehensive metabolic panel  . Lactate dehydrogenase      Derek Jack, MD [place] [phone]

## 2018-03-25 ENCOUNTER — Telehealth (HOSPITAL_COMMUNITY): Payer: Self-pay | Admitting: *Deleted

## 2018-03-25 NOTE — Telephone Encounter (Signed)
Pt called stating that her feet have started to tingle and that they hurt terribly. I spoke with our nurse navigator and due to Dr. Raliegh Ip being out of the office she recommended that the pt call her transplant team to find out she should do. I advised the pt of this and that if there was anything we could do after talking to them to let us know. Pt verbalized understanding.

## 2018-04-01 ENCOUNTER — Other Ambulatory Visit (HOSPITAL_COMMUNITY): Payer: Self-pay

## 2018-04-01 ENCOUNTER — Ambulatory Visit (HOSPITAL_COMMUNITY): Payer: Self-pay | Admitting: Hematology

## 2018-04-01 DIAGNOSIS — Z9481 Bone marrow transplant status: Secondary | ICD-10-CM | POA: Insufficient documentation

## 2018-04-02 ENCOUNTER — Other Ambulatory Visit: Payer: Self-pay | Admitting: Family Medicine

## 2018-04-02 NOTE — Telephone Encounter (Signed)
Ok plus 2 ref 

## 2018-04-03 ENCOUNTER — Other Ambulatory Visit (HOSPITAL_COMMUNITY): Payer: Self-pay

## 2018-04-03 ENCOUNTER — Ambulatory Visit (HOSPITAL_COMMUNITY): Payer: Self-pay | Admitting: Hematology

## 2018-04-03 DIAGNOSIS — N200 Calculus of kidney: Secondary | ICD-10-CM | POA: Diagnosis not present

## 2018-04-13 ENCOUNTER — Other Ambulatory Visit: Payer: Self-pay | Admitting: Family Medicine

## 2018-04-14 ENCOUNTER — Other Ambulatory Visit: Payer: Self-pay

## 2018-04-14 MED ORDER — BUMETANIDE 1 MG PO TABS: 1.0000 mg | ORAL_TABLET | Freq: Every day | ORAL | 1 refills | Status: DC

## 2018-04-16 ENCOUNTER — Other Ambulatory Visit (HOSPITAL_COMMUNITY): Payer: Self-pay | Admitting: *Deleted

## 2018-04-16 DIAGNOSIS — C9000 Multiple myeloma not having achieved remission: Secondary | ICD-10-CM

## 2018-04-16 MED ORDER — HYDROCODONE-ACETAMINOPHEN 5-325 MG PO TABS: 1.0000 | ORAL_TABLET | Freq: Four times a day (QID) | ORAL | 0 refills | Status: DC | PRN

## 2018-04-18 DIAGNOSIS — Z9484 Stem cells transplant status: Secondary | ICD-10-CM | POA: Diagnosis not present

## 2018-04-18 DIAGNOSIS — Z01818 Encounter for other preprocedural examination: Secondary | ICD-10-CM | POA: Diagnosis not present

## 2018-04-18 DIAGNOSIS — I10 Essential (primary) hypertension: Secondary | ICD-10-CM | POA: Diagnosis not present

## 2018-04-18 DIAGNOSIS — N200 Calculus of kidney: Secondary | ICD-10-CM | POA: Diagnosis not present

## 2018-04-18 DIAGNOSIS — E118 Type 2 diabetes mellitus with unspecified complications: Secondary | ICD-10-CM | POA: Diagnosis not present

## 2018-04-18 DIAGNOSIS — Z87440 Personal history of urinary (tract) infections: Secondary | ICD-10-CM | POA: Diagnosis not present

## 2018-04-18 DIAGNOSIS — C9001 Multiple myeloma in remission: Secondary | ICD-10-CM | POA: Diagnosis not present

## 2018-04-18 DIAGNOSIS — Z6841 Body Mass Index (BMI) 40.0 and over, adult: Secondary | ICD-10-CM | POA: Diagnosis not present

## 2018-04-18 DIAGNOSIS — N132 Hydronephrosis with renal and ureteral calculous obstruction: Secondary | ICD-10-CM | POA: Diagnosis not present

## 2018-04-18 DIAGNOSIS — C9000 Multiple myeloma not having achieved remission: Secondary | ICD-10-CM | POA: Diagnosis not present

## 2018-04-18 DIAGNOSIS — I517 Cardiomegaly: Secondary | ICD-10-CM | POA: Diagnosis not present

## 2018-04-18 DIAGNOSIS — E1165 Type 2 diabetes mellitus with hyperglycemia: Secondary | ICD-10-CM | POA: Diagnosis not present

## 2018-04-22 ENCOUNTER — Encounter (HOSPITAL_COMMUNITY): Payer: Self-pay

## 2018-04-22 ENCOUNTER — Inpatient Hospital Stay (HOSPITAL_COMMUNITY): Payer: BLUE CROSS/BLUE SHIELD

## 2018-04-22 ENCOUNTER — Inpatient Hospital Stay (HOSPITAL_COMMUNITY): Payer: BLUE CROSS/BLUE SHIELD | Attending: Hematology | Admitting: Hematology

## 2018-04-22 ENCOUNTER — Encounter (HOSPITAL_COMMUNITY): Payer: Self-pay | Admitting: Hematology

## 2018-04-22 ENCOUNTER — Inpatient Hospital Stay (HOSPITAL_COMMUNITY): Payer: BLUE CROSS/BLUE SHIELD | Attending: Hematology

## 2018-04-22 ENCOUNTER — Other Ambulatory Visit: Payer: Self-pay

## 2018-04-22 VITALS — BP 120/82 | HR 95 | Temp 98.3°F | Resp 18 | Wt 257.4 lb

## 2018-04-22 VITALS — BP 110/68 | HR 87 | Temp 98.3°F | Resp 18

## 2018-04-22 DIAGNOSIS — G629 Polyneuropathy, unspecified: Secondary | ICD-10-CM | POA: Insufficient documentation

## 2018-04-22 DIAGNOSIS — C9000 Multiple myeloma not having achieved remission: Secondary | ICD-10-CM

## 2018-04-22 DIAGNOSIS — M545 Low back pain: Secondary | ICD-10-CM

## 2018-04-22 DIAGNOSIS — E119 Type 2 diabetes mellitus without complications: Secondary | ICD-10-CM | POA: Diagnosis not present

## 2018-04-22 DIAGNOSIS — G620 Drug-induced polyneuropathy: Secondary | ICD-10-CM | POA: Diagnosis not present

## 2018-04-22 DIAGNOSIS — Z794 Long term (current) use of insulin: Secondary | ICD-10-CM | POA: Insufficient documentation

## 2018-04-22 DIAGNOSIS — Z87891 Personal history of nicotine dependence: Secondary | ICD-10-CM | POA: Insufficient documentation

## 2018-04-22 DIAGNOSIS — Z9484 Stem cells transplant status: Secondary | ICD-10-CM | POA: Insufficient documentation

## 2018-04-22 DIAGNOSIS — T451X5A Adverse effect of antineoplastic and immunosuppressive drugs, initial encounter: Secondary | ICD-10-CM

## 2018-04-22 LAB — CBC WITH DIFFERENTIAL/PLATELET
Abs Immature Granulocytes: 0.03 10*3/uL (ref 0.00–0.07)
BASOS PCT: 1 %
Basophils Absolute: 0 10*3/uL (ref 0.0–0.1)
EOS ABS: 0.1 10*3/uL (ref 0.0–0.5)
Eosinophils Relative: 1 %
HCT: 36.7 % (ref 36.0–46.0)
Hemoglobin: 12.1 g/dL (ref 12.0–15.0)
Immature Granulocytes: 1 %
Lymphocytes Relative: 38 %
Lymphs Abs: 1.7 10*3/uL (ref 0.7–4.0)
MCH: 30.3 pg (ref 26.0–34.0)
MCHC: 33 g/dL (ref 30.0–36.0)
MCV: 92 fL (ref 80.0–100.0)
MONO ABS: 0.8 10*3/uL (ref 0.1–1.0)
MONOS PCT: 18 %
Neutro Abs: 1.8 10*3/uL (ref 1.7–7.7)
Neutrophils Relative %: 41 %
Platelets: 218 10*3/uL (ref 150–400)
RBC: 3.99 MIL/uL (ref 3.87–5.11)
RDW: 15.6 % — ABNORMAL HIGH (ref 11.5–15.5)
WBC: 4.4 10*3/uL (ref 4.0–10.5)
nRBC: 0 % (ref 0.0–0.2)

## 2018-04-22 LAB — COMPREHENSIVE METABOLIC PANEL
ALBUMIN: 4.2 g/dL (ref 3.5–5.0)
ALK PHOS: 67 U/L (ref 38–126)
ALT: 31 U/L (ref 0–44)
AST: 54 U/L — AB (ref 15–41)
Anion gap: 9 (ref 5–15)
BUN: 19 mg/dL (ref 6–20)
CALCIUM: 8.4 mg/dL — AB (ref 8.9–10.3)
CO2: 27 mmol/L (ref 22–32)
CREATININE: 1.07 mg/dL — AB (ref 0.44–1.00)
Chloride: 105 mmol/L (ref 98–111)
GFR calc Af Amer: >60 mL/min (ref >60)
GFR calc non Af Amer: 58 mL/min — ABNORMAL LOW (ref >60)
GLUCOSE: 153 mg/dL — AB (ref 70–99)
Potassium: 3.4 mmol/L — ABNORMAL LOW (ref 3.5–5.1)
SODIUM: 141 mmol/L (ref 135–145)
Total Bilirubin: 0.9 mg/dL (ref 0.3–1.2)
Total Protein: 7.2 g/dL (ref 6.5–8.1)

## 2018-04-22 LAB — LACTATE DEHYDROGENASE: LDH: 205 U/L — ABNORMAL HIGH (ref 98–192)

## 2018-04-22 LAB — MAGNESIUM: Magnesium: 0.9 mg/dL — CL (ref 1.7–2.4)

## 2018-04-22 MED ORDER — ZOLEDRONIC ACID 4 MG/100ML IV SOLN: 4.0000 mg | Freq: Once | INTRAVENOUS | Status: DC

## 2018-04-22 MED ORDER — MAGNESIUM SULFATE 4 GM/100ML IV SOLN
4.0000 g | Freq: Once | INTRAVENOUS | Status: AC
  Administered 2018-04-22: 4 g via INTRAVENOUS
  Filled 2018-04-22: qty 100

## 2018-04-22 MED ORDER — ZOLEDRONIC ACID 4 MG/5ML IV CONC
4.0000 mg | Freq: Once | INTRAVENOUS | Status: AC
  Administered 2018-04-22: 4 mg via INTRAVENOUS
  Filled 2018-04-22: qty 5

## 2018-04-22 MED ORDER — SODIUM CHLORIDE 0.9 % IV SOLN
INTRAVENOUS | Status: DC
  Administered 2018-04-22: 11:00:00 via INTRAVENOUS

## 2018-04-22 MED ORDER — SODIUM CHLORIDE 0.9 % IV SOLN: 4.0000 g | Freq: Once | INTRAVENOUS | Status: DC

## 2018-04-22 NOTE — Patient Instructions (Signed)
[place] at [facility]  Discharge Instructions:  You received zometa and magnesium today. _______________________________________________________________  Thank you for choosing [place] at [facility] to provide your oncology and hematology care.  To afford each patient quality time with our providers, please arrive at least 15 minutes before your scheduled appointment.  You need to re-schedule your appointment if you arrive 10 or more minutes late.  We strive to give you quality time with our providers, and arriving late affects you and other patients whose appointments are after yours.  Also, if you no show three or more times for appointments you may be dismissed from the clinic.  Again, thank you for choosing [place] at [place] hope is that these requests will allow you access to exceptional care and in a timely manner. _______________________________________________________________  If you have questions after your visit, please contact our office at [phone] between the hours of 8:30 a.m. and 5:00 p.m. Voicemails left after 4:30 p.m. will not be returned until the following business day. _______________________________________________________________  For prescription refill requests, have your pharmacy contact our office. _______________________________________________________________  Recommendations made by the consultant and any test results will be sent to your referring physician. _______________________________________________________________

## 2018-04-22 NOTE — Patient Instructions (Addendum)
[place] at [facility] Discharge Instructions  Follow up in 4 weeks with labs. You can come to the clinic for a labs draw next weeks.    Thank you for choosing [place] at [facility] to provide your oncology and hematology care.  To afford each patient quality time with our provider, please arrive at least 15 minutes before your scheduled appointment time.   If you have a lab appointment with the [facility] please come in thru the  Main Entrance and check in at the main information desk  You need to re-schedule your appointment should you arrive 10 or more minutes late.  We strive to give you quality time with our providers, and arriving late affects you and other patients whose appointments are after yours.  Also, if you no show three or more times for appointments you may be dismissed from the clinic at the providers discretion.     Again, thank you for choosing [facility].  Our hope is that these requests will decrease the amount of time that you wait before being seen by our physicians.       _____________________________________________________________  Should you have questions after your visit to [facility], please contact our office at [phone] between the hours of 8:00 a.m. and 4:30 p.m.  Voicemails left after 4:00 p.m. will not be returned until the following business day.  For prescription refill requests, have your pharmacy contact our office and allow 72 hours.    Cancer Center Support Programs:   > Cancer Support Group  2nd Tuesday of the month 1pm-2pm, Journey Room

## 2018-04-22 NOTE — Progress Notes (Signed)
[place], [place] [address]   CLINIC:  Medical Oncology/Hematology  PCP:  Mikey Kirschner, MD [address] [place] [address] [phone]   REASON FOR VISIT: Follow-up for multiple myeloma  CURRENT THERAPY: S/P transplant restarting zometa   BRIEF ONCOLOGIC HISTORY:    Multiple myeloma ([facility])   10/29/2017 Initial Diagnosis    Multiple myeloma ([facility])    11/01/2017 -  Chemotherapy    The patient had bortezomib SQ (VELCADE) chemo injection 3 mg, 1.3 mg/m2 = 3 mg, Subcutaneous,  Once, 5 of 5 cycles Administration: 3 mg (11/01/2017), 3 mg (11/08/2017), 3 mg (11/05/2017), 3 mg (11/22/2017), 3 mg (11/12/2017), 3 mg (11/29/2017), 3 mg (11/26/2017), 3 mg (12/03/2017), 3 mg (12/13/2017), 3 mg (12/20/2017), 3 mg (12/17/2017), 3 mg (12/24/2017), 3 mg (01/03/2018), 3 mg (01/10/2018), 3 mg (01/07/2018), 3 mg (01/14/2018), 3 mg (01/24/2018)  for chemotherapy treatment.       CANCER STAGING: Cancer Staging Multiple myeloma ([facility]) Staging form: Plasma Cell Myeloma and Plasma Cell Disorders, AJCC 8th Edition - Clinical: No stage assigned - Unsigned    INTERVAL HISTORY:  Joanna Reid 52 y.o. female returns for routine follow-up for multiple myeloma. Patient is here today with her husband. She is having neuropathy in her feet. She reports it is constant and worse at night. She is having occasional nausea and vomiting. She has medications that are helping with this. Her fatigue is worse during the day. She reports her appetite at 75% and she is having no problem eating or maintaining her weight at this time. Her energy level is 50%. She denies any new pains. Denies any diarrhea. Denies any skin rashes or mouth sores.     REVIEW OF SYSTEMS:  Review of Systems  Constitutional: Positive for fatigue.  Gastrointestinal: Positive for nausea and vomiting.  Neurological: Positive for extremity weakness.  All other systems reviewed and are negative.    PAST  MEDICAL/SURGICAL HISTORY:  Past Medical History:  Diagnosis Date  . Acid reflux   . Allergic rhinitis   . Diabetes mellitus    type 2  . Gout   . Gout   . HBP (high blood pressure)   . History of kidney stones   . Migraines    Past Surgical History:  Procedure Laterality Date  . CESAREAN SECTION    . EXTRACORPOREAL SHOCK WAVE LITHOTRIPSY Left 10/10/2017   Procedure: LEFT EXTRACORPOREAL SHOCK WAVE LITHOTRIPSY (ESWL);  Surgeon: Bjorn Loser, MD;  Location: [facility];  Service: Urology;  Laterality: Left;  . EYE SURGERY    . HEMORRHOID SURGERY N/A 11/19/2012   Procedure: HEMORRHOIDECTOMY;  Surgeon: Jamesetta So, MD;  Location: [facility];  Service: General;  Laterality: N/A;  . kidney stones  1998  . LAPAROSCOPIC UNILATERAL SALPINGO OOPHERECTOMY  05/14/2012   Procedure: LAPAROSCOPIC UNILATERAL SALPINGO OOPHORECTOMY;  Surgeon: [name], MD;  Location: [facility];  Service: Gynecology;  Laterality: Right;  laparoscopic right salpingo-oophorectomy  . PARTIAL HYSTERECTOMY    . TONSILECTOMY, ADENOIDECTOMY, BILATERAL MYRINGOTOMY AND TUBES    . VESICOVAGINAL FISTULA CLOSURE W/ TAH       SOCIAL HISTORY:  Social History   Socioeconomic History  . Marital status: Married    Spouse name: Not on file  . Number of children: Not on file  . Years of education: 93  . Highest education level: Not on file  Occupational History    Employer: [employer]  . Financial resource strain:  Not on file  . Food insecurity:    Worry: Never true    Inability: Never true  . Transportation needs:    Medical: Not on file    Non-medical: Not on file  Tobacco Use  . Smoking status: Former Smoker    Last attempt to quit: 02/27/1999    Years since quitting: 19.1  . Smokeless tobacco: Never Used  . Tobacco comment: socially   Substance and Sexual Activity  . Alcohol use: No    Alcohol/week: 0.0 standard drinks  . Drug use: No  . Sexual activity: Yes  Lifestyle  . Physical activity:    Days  per week: Not on file    Minutes per session: Not on file  . Stress: Not on file  Relationships  . Social connections:    Talks on phone: Not on file    Gets together: Not on file    Attends religious service: Not on file    Active member of club or organization: Not on file    Attends meetings of clubs or organizations: Not on file    Relationship status: Not on file  . Intimate partner violence:    Fear of current or ex partner: Not on file    Emotionally abused: Not on file    Physically abused: Not on file    Forced sexual activity: Not on file  Other Topics Concern  . Not on file  Social History Narrative  . Not on file    FAMILY HISTORY:  Family History  Problem Relation Age of Onset  . Arthritis Unknown   . Cancer Unknown   . Diabetes Unknown   . Hypertension Mother   . Dementia Mother   . Diabetes Father   . ALS Father   . Diabetes Brother   . Hypertension Brother   . Cancer Paternal Aunt   . COPD Maternal Grandmother   . Cancer Maternal Grandfather   . Anesthesia problems Paternal Grandfather     CURRENT MEDICATIONS:  Outpatient Encounter Medications as of 04/22/2018  Medication Sig Note  . acyclovir (ZOVIRAX) 400 MG tablet Take 1 tablet (400 mg total) by mouth 2 (two) times daily.   Marland Kitchen albuterol (PROVENTIL HFA;VENTOLIN HFA) 108 (90 Base) MCG/ACT inhaler Inhale 1-2 puffs into the lungs daily as needed.   Marland Kitchen albuterol (PROVENTIL HFA;VENTOLIN HFA) 108 (90 Base) MCG/ACT inhaler Inhale into the lungs.   Marland Kitchen allopurinol (ZYLOPRIM) 300 MG tablet TAKE 1 TABLET BY MOUTH  DAILY FOR GOUT   . aspirin EC 81 MG tablet Take 81 mg by mouth daily.   . bumetanide (BUMEX) 1 MG tablet Take 1 tablet (1 mg total) by mouth daily.   . Calcium Carb-Cholecalciferol (CALCIUM 600+D3 PO) Take by mouth. 11/08/2017: Calcium 667m with Vitamin D 1,000 units BID  . carvedilol (COREG) 6.25 MG tablet Take by mouth.   . ciprofloxacin (CIPRO) 500 MG tablet    . dexamethasone (DECADRON) 4 MG tablet  Take 10 tablets (40 mg total) by mouth once a week.   . diazepam (VALIUM) 5 MG tablet TAKE 1 TABLET BY MOUTH AT BEDTIME AS NEEDED FOR ANXIETY OR INSOMNIA   . Docosanol 10 % CREA Apply topically.   . docusate sodium (COLACE) 100 MG capsule Take by mouth.   . fluconazole (DIFLUCAN) 200 MG tablet    . fluticasone (FLONASE) 50 MCG/ACT nasal spray Place 2 sprays into the nose 2 (two) times daily.   .Marland Kitchenglucose blood (PRECISION QID TEST) test strip Use 1  each (1 strip total) 4 (four) times daily Use as instructed. OneTouch Verio   . HUMALOG KWIKPEN 100 UNIT/ML KiwkPen INJECT THREE TIMES DAILY BEFORE MEALS PER CORRECTION SCALE. MAX DOSE PER DAY IS 35 UNITS   . HYDROcodone-acetaminophen (NORCO/VICODIN) 5-325 MG tablet Take 1 tablet by mouth every 6 (six) hours as needed for moderate pain.   . Insulin Glargine (LANTUS SOLOSTAR) 100 UNIT/ML Solostar Pen Inject into the skin.   . Lactulose 20 GM/30ML SOLN Take 72m by mouth every 3-4 hours until bowel movement. Then take 380mby mouth daily.   . Lancets Misc. (UNISTIK 2 NORMAL) MISC Use 1 each 4 (four) times daily Use as instructed.   . Marland Kitchenenalidomide (REVLIMID) 25 MG capsule Take 1 capsule by mouth daily x 14 days in a row followed by a 7 day rest period. Then resume regimen.   . Marland Kitchenosartan (COZAAR) 100 MG tablet Take 1 tablet (100 mg total) by mouth daily.   . Marland Kitchenosartan (COZAAR) 100 MG tablet TAKE 1 TABLET BY MOUTH  DAILY   . losartan (COZAAR) 100 MG tablet TAKE 1 TABLET BY MOUTH  DAILY   . meloxicam (MOBIC) 15 MG tablet TAKE 1 TABLET BY MOUTH ONCE DAILY   . metFORMIN (GLUCOPHAGE) 500 MG tablet TAKE 1 TABLET BY MOUTH ONCE DAILY WITH BREAKFAST   . nitrofurantoin, macrocrystal-monohydrate, (MACROBID) 100 MG capsule Take 100 mg by mouth daily.    . Marland Kitchenmeprazole (PRILOSEC) 20 MG capsule Take 1 capsule (20 mg total) by mouth daily.   . ondansetron (ZOFRAN) 8 MG tablet Take 1 tablet (8 mg total) by mouth every 8 (eight) hours as needed.   . Glory RosebushERIO test strip  USE 1 STRIP TO CHECK GLUCOSE 4 TIMES DAILY AS DIRECTED   . polyethylene glycol powder (GLYCOLAX/MIRALAX) powder Take 1 Container by mouth daily.   . prochlorperazine (COMPAZINE) 10 MG tablet Take 1 tablet (10 mg total) by mouth every 6 (six) hours as needed for nausea or vomiting.   . verapamil (CALAN-SR) 240 MG CR tablet Take 1 tablet (240 mg total) by mouth at bedtime.   . Vitamin D, Ergocalciferol, (DRISDOL) 50000 units CAPS capsule    . ZARXIO 300 MCG/0.5ML SOSY    . ZARXIO 480 MCG/0.8ML SOSY     No facility-administered encounter medications on file as of 04/22/2018.     ALLERGIES:  No Known Allergies   PHYSICAL EXAM:  ECOG Performance status: 1  Vitals:   04/22/18 0956  BP: 120/82  Pulse: 95  Resp: 18  Temp: 98.3 F (36.8 C)  SpO2: 99%   Filed Weights   04/22/18 0956  Weight: 257 lb 6.4 oz (116.8 kg)    Physical Exam  Constitutional: She is oriented to person, place, and time. She appears well-developed and well-nourished.  Cardiovascular: Normal rate, regular rhythm and normal heart sounds.  Pulmonary/Chest: Effort normal and breath sounds normal.  Musculoskeletal: Normal range of motion.  Neurological: She is alert and oriented to person, place, and time.  Skin: Skin is warm and dry.  Psychiatric: She has a normal mood and affect. Her behavior is normal. Judgment and thought content normal.     LABORATORY DATA:  I have reviewed the labs as listed.  CBC    Component Value Date/Time   WBC 4.4 04/22/2018 0910   RBC 3.99 04/22/2018 0910   HGB 12.1 04/22/2018 0910   HGB 14.2 08/02/2015 1113   HCT 36.7 04/22/2018 0910   HCT 41.1 08/02/2015 1113   PLT 218 04/22/2018  0910   PLT 371 08/02/2015 1113   MCV 92.0 04/22/2018 0910   MCV 84 08/02/2015 1113   MCH 30.3 04/22/2018 0910   MCHC 33.0 04/22/2018 0910   RDW 15.6 (H) 04/22/2018 0910   RDW 15.1 08/02/2015 1113   LYMPHSABS 1.7 04/22/2018 0910   LYMPHSABS 2.9 08/02/2015 1113   MONOABS 0.8 04/22/2018 0910     EOSABS 0.1 04/22/2018 0910   EOSABS 0.2 08/02/2015 1113   BASOSABS 0.0 04/22/2018 0910   BASOSABS 0.1 08/02/2015 1113   CMP Latest Ref Rng & Units 04/22/2018 03/14/2018 01/20/2018  Glucose 70 - 99 mg/dL 153(H) 195(H) 288(H)  BUN 6 - 20 mg/dL 19 10 24(H)  Creatinine 0.44 - 1.00 mg/dL 1.07(H) 0.69 0.97  Sodium 135 - 145 mmol/L 141 140 140  Potassium 3.5 - 5.1 mmol/L 3.4(L) 3.7 3.1(L)  Chloride 98 - 111 mmol/L 105 107 102  CO2 22 - 32 mmol/L _0 Calcium 8.9 - 10.3 mg/dL 8.4(L) 9.0 9.6  Total Protein 6.5 - 8.1 g/dL 7.2 6.2(L) 6.8  Total Bilirubin 0.3 - 1.2 mg/dL 0.9 0.6 0.9  Alkaline Phos 38 - 126 U/L 67 57 106  AST 15 - 41 U/L 54(H) 32 57(H)  ALT 0 - 44 U/L 31 22 62(H)         ASSESSMENT & PLAN:   Multiple myeloma ([facility]) 1.  IgG lambda multiple myeloma, stage III, del 17p: - Presentation with lower back pain since March, presented to the emergency room on 10/25/2017 with right groin pain, CT scan showed multiple lytic lesions, multiple lytic lesions on skeletal survey - SPEP shows 1.5 g/dL of monoclonal protein, beta-2 microglobulin of 4.8, free lambda light chains of 2869, kappa by lambda ratio of less than 0, elevated LDH. 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  FISH panel shows -14, -12, 13 q.-/-13 and 17p-.  Bone marrow biopsy shows 90% plasma cells. -Cycle 1 of RVD started on 11/01/2017. -She had a consultation with Dr. Laverta Baltimore at [facility] on 12/19/2017.  Upfront bone marrow transplant was recommended after 4 cycles because of her 17 P deletion. - Cycle 4 Velcade was started on 01/03/2018.  Revlimid was started on 01/09/2018.  She has tolerated them very well.  No signs of peripheral neuropathy or other side effects from Revlimid noted. -She completed cycle 4 on 01/14/2018.  She received 1 more additional dose of Velcade on 01/24/2018.  Labs done on 01/20/2018 showed M spike to be negative. - She was evaluated by Dr. Laverta Baltimore at [facility].  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  She was discharged home on 03/07/2018.  Her energy levels are improving. - I have reviewed blood work today which shows normalized CBC.  Her creatinine went up slightly as she was started back on Bumex. - She will restart her zoledronic acid today.  We have checked her magnesium which was severely low at 0.9.  We have repleted her magnesium today. -She will be reevaluated in 4 weeks.  She is having her myeloma panel drawn for [facility] next week.  2.  Low back pain: Her low back pain completely improved after starting myeloma therapy.  She is not taking any pain medicine.  3.  Peripheral neuropathy: -She developed neuropathy few weeks ago with numbness and tingling and  pins-and-needles sensation in both feet.  She was started on Lyrica 100 mg twice daily.  This is not helping completely. -We will increase Lyrica to 200 mg twice a day.  4.  ID prophylaxis: She is currently receiving acyclovir 800 mg twice daily.  Her aspirin has been on hold since transplant.      Orders placed this encounter:  Orders Placed This Encounter  Procedures  . Magnesium  . Lactate dehydrogenase  . Magnesium  . CBC with Differential/Platelet  . Comprehensive metabolic panel      Derek Jack, MD [place] [phone]

## 2018-04-22 NOTE — Assessment & Plan Note (Signed)
1.  IgG lambda multiple myeloma, stage III, del 17p: - Presentation with lower back pain since March, presented to the emergency room on 10/25/2017 with right groin pain, CT scan showed multiple lytic lesions, multiple lytic lesions on skeletal survey - SPEP shows 1.5 g/dL of monoclonal protein, beta-2 microglobulin of 4.8, free lambda light chains of 2869, kappa by lambda ratio of less than 0, elevated LDH. 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  FISH panel shows -14, -12, 13 q.-/-13 and 17p-.  Bone marrow biopsy shows 90% plasma cells. -Cycle 1 of RVD started on 11/01/2017. -She had a consultation with Dr. Laverta Baltimore at [facility] on 12/19/2017.  Upfront bone marrow transplant was recommended after 4 cycles because of her 17 P deletion. - Cycle 4 Velcade was started on 01/03/2018.  Revlimid was started on 01/09/2018.  She has tolerated them very well.  No signs of peripheral neuropathy or other side effects from Revlimid noted. -She completed cycle 4 on 01/14/2018.  She received 1 more additional dose of Velcade on 01/24/2018.  Labs done on 01/20/2018 showed M spike to be negative. - She was evaluated by Dr. Laverta Baltimore at [facility].  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  She was discharged home on 03/07/2018.  Her energy levels are improving. - I have reviewed blood work today which shows normalized CBC.  Her creatinine went up slightly as she was started back on Bumex. - She will restart her zoledronic acid today.  We have checked her magnesium which was severely low at 0.9.  We have repleted her magnesium today. -She will be reevaluated in 4 weeks.  She is having her myeloma panel drawn for [facility] next week.  2.  Low back pain: Her low back pain completely improved after starting  myeloma therapy.  She is not taking any pain medicine.  3.  Peripheral neuropathy: -She developed neuropathy few weeks ago with numbness and tingling and pins-and-needles sensation in both feet.  She was started on Lyrica 100 mg twice daily.  This is not helping completely. -We will increase Lyrica to 200 mg twice a day.  4.  ID prophylaxis: She is currently receiving acyclovir 800 mg twice daily.  Her aspirin has been on hold since transplant.

## 2018-04-22 NOTE — Progress Notes (Signed)
Patient tolerated zometa and magnesium infusion with no complaints voiced.  Peripheral IV site clean and dry with no bruising or swelling noted at site.  Good blood return noted with peripheral IV before and after fluids.  Band aid applied.  VSS with discharge and left ambulatory with no s/s of distress noted.  Family at side.

## 2018-04-25 ENCOUNTER — Other Ambulatory Visit: Payer: Self-pay

## 2018-04-25 ENCOUNTER — Encounter (HOSPITAL_COMMUNITY): Payer: Self-pay | Admitting: Emergency Medicine

## 2018-04-25 ENCOUNTER — Other Ambulatory Visit (HOSPITAL_COMMUNITY): Payer: Self-pay

## 2018-04-25 ENCOUNTER — Ambulatory Visit (HOSPITAL_COMMUNITY): Payer: Self-pay | Admitting: Hematology

## 2018-04-25 ENCOUNTER — Inpatient Hospital Stay (HOSPITAL_COMMUNITY)
Admission: EM | Admit: 2018-04-25 | Discharge: 2018-04-26 | DRG: 872 | Disposition: A | Discharge status: Home / Self Care | Payer: BLUE CROSS/BLUE SHIELD | Attending: Internal Medicine | Admitting: Internal Medicine

## 2018-04-25 ENCOUNTER — Emergency Department (HOSPITAL_COMMUNITY): Payer: BLUE CROSS/BLUE SHIELD

## 2018-04-25 DIAGNOSIS — Z825 Family history of asthma and other chronic lower respiratory diseases: Secondary | ICD-10-CM | POA: Diagnosis not present

## 2018-04-25 DIAGNOSIS — E119 Type 2 diabetes mellitus without complications: Secondary | ICD-10-CM

## 2018-04-25 DIAGNOSIS — Z79899 Other long term (current) drug therapy: Secondary | ICD-10-CM

## 2018-04-25 DIAGNOSIS — R509 Fever, unspecified: Secondary | ICD-10-CM | POA: Diagnosis not present

## 2018-04-25 DIAGNOSIS — J209 Acute bronchitis, unspecified: Secondary | ICD-10-CM

## 2018-04-25 DIAGNOSIS — J309 Allergic rhinitis, unspecified: Secondary | ICD-10-CM | POA: Diagnosis not present

## 2018-04-25 DIAGNOSIS — A419 Sepsis, unspecified organism: Secondary | ICD-10-CM | POA: Diagnosis not present

## 2018-04-25 DIAGNOSIS — Z90721 Acquired absence of ovaries, unilateral: Secondary | ICD-10-CM | POA: Diagnosis not present

## 2018-04-25 DIAGNOSIS — Z90710 Acquired absence of both cervix and uterus: Secondary | ICD-10-CM

## 2018-04-25 DIAGNOSIS — Z9079 Acquired absence of other genital organ(s): Secondary | ICD-10-CM | POA: Diagnosis not present

## 2018-04-25 DIAGNOSIS — Z87442 Personal history of urinary calculi: Secondary | ICD-10-CM

## 2018-04-25 DIAGNOSIS — Z9221 Personal history of antineoplastic chemotherapy: Secondary | ICD-10-CM

## 2018-04-25 DIAGNOSIS — Z7982 Long term (current) use of aspirin: Secondary | ICD-10-CM | POA: Diagnosis not present

## 2018-04-25 DIAGNOSIS — Z9484 Stem cells transplant status: Secondary | ICD-10-CM | POA: Diagnosis not present

## 2018-04-25 DIAGNOSIS — Z7951 Long term (current) use of inhaled steroids: Secondary | ICD-10-CM | POA: Diagnosis not present

## 2018-04-25 DIAGNOSIS — Z6841 Body Mass Index (BMI) 40.0 and over, adult: Secondary | ICD-10-CM | POA: Diagnosis not present

## 2018-04-25 DIAGNOSIS — F902 Attention-deficit hyperactivity disorder, combined type: Secondary | ICD-10-CM | POA: Diagnosis present

## 2018-04-25 DIAGNOSIS — R079 Chest pain, unspecified: Secondary | ICD-10-CM | POA: Diagnosis not present

## 2018-04-25 DIAGNOSIS — C9000 Multiple myeloma not having achieved remission: Secondary | ICD-10-CM | POA: Diagnosis not present

## 2018-04-25 DIAGNOSIS — I10 Essential (primary) hypertension: Secondary | ICD-10-CM | POA: Diagnosis not present

## 2018-04-25 DIAGNOSIS — Z8249 Family history of ischemic heart disease and other diseases of the circulatory system: Secondary | ICD-10-CM

## 2018-04-25 DIAGNOSIS — Z794 Long term (current) use of insulin: Secondary | ICD-10-CM | POA: Diagnosis not present

## 2018-04-25 DIAGNOSIS — M109 Gout, unspecified: Secondary | ICD-10-CM | POA: Diagnosis present

## 2018-04-25 DIAGNOSIS — Z87891 Personal history of nicotine dependence: Secondary | ICD-10-CM | POA: Diagnosis not present

## 2018-04-25 DIAGNOSIS — J069 Acute upper respiratory infection, unspecified: Secondary | ICD-10-CM | POA: Diagnosis not present

## 2018-04-25 DIAGNOSIS — Z833 Family history of diabetes mellitus: Secondary | ICD-10-CM | POA: Diagnosis not present

## 2018-04-25 DIAGNOSIS — K219 Gastro-esophageal reflux disease without esophagitis: Secondary | ICD-10-CM | POA: Diagnosis present

## 2018-04-25 DIAGNOSIS — R0602 Shortness of breath: Secondary | ICD-10-CM | POA: Diagnosis not present

## 2018-04-25 LAB — PROTIME-INR
INR: 0.95
PROTHROMBIN TIME: 12.6 s (ref 11.4–15.2)

## 2018-04-25 LAB — URINALYSIS, ROUTINE W REFLEX MICROSCOPIC
BILIRUBIN URINE: NEGATIVE
Glucose, UA: NEGATIVE mg/dL
HGB URINE DIPSTICK: NEGATIVE
Ketones, ur: NEGATIVE mg/dL
Leukocytes, UA: NEGATIVE
Nitrite: NEGATIVE
PROTEIN: NEGATIVE mg/dL
SPECIFIC GRAVITY, URINE: 1.018 (ref 1.005–1.030)
pH: 5 (ref 5.0–8.0)

## 2018-04-25 LAB — I-STAT CG4 LACTIC ACID, ED: LACTIC ACID, VENOUS: 3.15 mmol/L — AB (ref 0.5–1.9)

## 2018-04-25 LAB — COMPREHENSIVE METABOLIC PANEL
ALBUMIN: 4.1 g/dL (ref 3.5–5.0)
ALT: 30 U/L (ref 0–44)
ANION GAP: 10 (ref 5–15)
AST: 40 U/L (ref 15–41)
Alkaline Phosphatase: 63 U/L (ref 38–126)
BILIRUBIN TOTAL: 0.5 mg/dL (ref 0.3–1.2)
BUN: 20 mg/dL (ref 6–20)
CO2: 24 mmol/L (ref 22–32)
Calcium: 8.6 mg/dL — ABNORMAL LOW (ref 8.9–10.3)
Chloride: 102 mmol/L (ref 98–111)
Creatinine, Ser: 1.15 mg/dL — ABNORMAL HIGH (ref 0.44–1.00)
GFR, EST NON AFRICAN AMERICAN: 53 mL/min — AB (ref >60)
Glucose, Bld: 126 mg/dL — ABNORMAL HIGH (ref 70–99)
POTASSIUM: 3.7 mmol/L (ref 3.5–5.1)
Sodium: 136 mmol/L (ref 135–145)
TOTAL PROTEIN: 7 g/dL (ref 6.5–8.1)

## 2018-04-25 LAB — CBC WITH DIFFERENTIAL/PLATELET
ABS IMMATURE GRANULOCYTES: 0.02 10*3/uL (ref 0.00–0.07)
BASOS PCT: 0 %
Basophils Absolute: 0 10*3/uL (ref 0.0–0.1)
Eosinophils Absolute: 0 10*3/uL (ref 0.0–0.5)
Eosinophils Relative: 0 %
HCT: 32.3 % — ABNORMAL LOW (ref 36.0–46.0)
Hemoglobin: 10.7 g/dL — ABNORMAL LOW (ref 12.0–15.0)
Immature Granulocytes: 1 %
Lymphocytes Relative: 46 %
Lymphs Abs: 1.7 10*3/uL (ref 0.7–4.0)
MCH: 31.8 pg (ref 26.0–34.0)
MCHC: 33.1 g/dL (ref 30.0–36.0)
MCV: 95.8 fL (ref 80.0–100.0)
Monocytes Absolute: 0.5 10*3/uL (ref 0.1–1.0)
Monocytes Relative: 12 %
NEUTROS ABS: 1.5 10*3/uL — AB (ref 1.7–7.7)
NEUTROS PCT: 41 %
PLATELETS: 176 10*3/uL (ref 150–400)
RBC: 3.37 MIL/uL — ABNORMAL LOW (ref 3.87–5.11)
RDW: 15.7 % — AB (ref 11.5–15.5)
WBC: 3.8 10*3/uL — ABNORMAL LOW (ref 4.0–10.5)
nRBC: 0 % (ref 0.0–0.2)

## 2018-04-25 MED ORDER — SODIUM CHLORIDE 0.9 % IV BOLUS
1000.0000 mL | Freq: Once | INTRAVENOUS | Status: AC
  Administered 2018-04-25: 1000 mL via INTRAVENOUS

## 2018-04-25 MED ORDER — VANCOMYCIN HCL 10 G IV SOLR
2000.0000 mg | Freq: Once | INTRAVENOUS | Status: AC
  Administered 2018-04-25: 2000 mg via INTRAVENOUS
  Filled 2018-04-25 (×2): qty 2000

## 2018-04-25 MED ORDER — VANCOMYCIN HCL IN DEXTROSE 750-5 MG/150ML-% IV SOLN
750.0000 mg | Freq: Two times a day (BID) | INTRAVENOUS | Status: DC
  Administered 2018-04-26: 750 mg via INTRAVENOUS
  Filled 2018-04-25 (×5): qty 150

## 2018-04-25 MED ORDER — SODIUM CHLORIDE 0.9 % IV SOLN
2.0000 g | Freq: Three times a day (TID) | INTRAVENOUS | Status: DC
  Administered 2018-04-26: 2 g via INTRAVENOUS
  Filled 2018-04-25 (×8): qty 2

## 2018-04-25 MED ORDER — SODIUM CHLORIDE 0.9 % IV SOLN
INTRAVENOUS | Status: DC
  Administered 2018-04-25: 21:00:00 via INTRAVENOUS

## 2018-04-25 MED ORDER — ALBUTEROL SULFATE (2.5 MG/3ML) 0.083% IN NEBU
2.5000 mg | INHALATION_SOLUTION | Freq: Once | RESPIRATORY_TRACT | Status: AC
  Administered 2018-04-25: 2.5 mg via RESPIRATORY_TRACT
  Filled 2018-04-25: qty 3

## 2018-04-25 MED ORDER — ACETAMINOPHEN 325 MG PO TABS
650.0000 mg | ORAL_TABLET | Freq: Once | ORAL | Status: AC
  Administered 2018-04-25: 650 mg via ORAL
  Filled 2018-04-25: qty 2

## 2018-04-25 MED ORDER — VANCOMYCIN HCL IN DEXTROSE 1-5 GM/200ML-% IV SOLN: 1000.0000 mg | Freq: Once | INTRAVENOUS | Status: DC

## 2018-04-25 MED ORDER — SODIUM CHLORIDE 0.9 % IV SOLN
2.0000 g | Freq: Once | INTRAVENOUS | Status: AC
  Administered 2018-04-25: 2 g via INTRAVENOUS
  Filled 2018-04-25: qty 2

## 2018-04-25 MED ORDER — METRONIDAZOLE IN NACL 5-0.79 MG/ML-% IV SOLN
500.0000 mg | Freq: Three times a day (TID) | INTRAVENOUS | Status: DC
  Administered 2018-04-25 – 2018-04-26 (×2): 500 mg via INTRAVENOUS
  Filled 2018-04-25 (×2): qty 100

## 2018-04-25 NOTE — ED Triage Notes (Signed)
Fever x 4 days.  Cough x 2 days.  Chest pressure that radiates between her shoulder blades. Pt is stem cell transplant pt.  Pt had zometa on Tuesday which can cause fever.  Pt states she felt like this was the cause of her fever.

## 2018-04-25 NOTE — Progress Notes (Signed)
Pharmacy Antibiotic Note  Joanna Reid is a 52 y.o. female admitted on 04/25/2018 with sepsis.  Pharmacy has been consulted for Cefepime and Vancomycin dosing.  Plan: Vancomycin 2000mg  IV loading dose, then 750mg   IV every 12 hours.  Goal trough 15-20 mcg/mL.  Cefepime 2gm IV q8h  F/U cxs and clinical progress Monitor V/S, labs and levels as indicated  Weight: 257 lb 6.2 oz (116.7 kg)  Temp (24hrs), Avg:100.3 F (37.9 C), Min:99.9 F (37.7 C), Max:100.6 F (38.1 C)  Recent Labs  Lab 04/22/18 0910 04/25/18 1857 04/25/18 1928  WBC 4.4 3.8*  --   CREATININE 1.07* 1.15*  --   LATICACIDVEN  --   --  3.15*    Estimated Creatinine Clearance: 71 mL/min (A) (by C-G formula based on SCr of 1.15 mg/dL (H)).    Normalized Cr Cl = 68mls/min  No Known Allergies  Antimicrobials this admission: Cefepime 10/18 >>  Vancomycin 10/18>>   Dose adjustments this admission: N/A  Microbiology results: 10/18 BCx: pending 10/18 UCx: pending  MRSA PCR:  Thank you for allowing pharmacy to be a part of this patient's care.  Isac Sarna, BS [place], [place] Clinical Pharmacist Pager [phone] 04/25/2018 9:22 PM

## 2018-04-25 NOTE — H&P (Addendum)
TRH H&P    Patient Demographics:    Joanna Reid, is a 52 y.o. female  MRN: [id number]  DOB - 09/06/1965  Admit Date - 04/25/2018  Referring MD/NP/PA: Dr. Melina Copa  Outpatient Primary MD for the patient is Luking, Grace Bushy, MD  Patient coming from: Home  Chief complaint-fever   HPI:    Joanna Reid  is a 52 y.o. female, with history of multiple myeloma currently not on chemotherapy, recent stem cell transplant at [facility] in August, history of kidney stone, recently stopped taking Macrobid after urologist from [place] told her that CT scan does not show a kidney stone anymore.  Patient took Macrobid for past 2 years to prevent UTI from 12 mm kidney stone. Patient also has type II diabetes mellitus, takes Lantus, metformin at home.  History of LVH, hypertension, GERD, gout. Patient came to the ED today as she developed fever, T-max 101, for past 4 days.  Also has been coughing up yellow phlegm.  She denies chest pain but admits to having mild shortness of breath.  Denies dysuria urgency or frequency of urination.  Denies abdominal pain.  No flank pain.  Denies hematuria. Denies nausea vomiting or diarrhea. In the ED chest x-ray showed no infiltrate.  UA is clear. Lactic acid was mildly elevated 3.15 so patient started on empiric antibiotics including vancomycin, cefepime, Flagyl for possible sepsis.  Urine and blood cultures have been obtained. [facility] oncologist Dr. Walden Field was consulted by ED physician who recommended that patient can stay at [facility] hospital with IV antibiotics and follow blood culture results. No previous history of stroke or seizures. No history of CAD.   Review of systems:      All other systems reviewed and are negative.   With Past History of the following :    Past Medical History:  Diagnosis Date  . Acid reflux   . Allergic rhinitis   . Diabetes mellitus    type 2  . Gout   . Gout   .  HBP (high blood pressure)   . History of kidney stones   . Migraines       Past Surgical History:  Procedure Laterality Date  . CESAREAN SECTION    . EXTRACORPOREAL SHOCK WAVE LITHOTRIPSY Left 10/10/2017   Procedure: LEFT EXTRACORPOREAL SHOCK WAVE LITHOTRIPSY (ESWL);  Surgeon: Bjorn Loser, MD;  Location: [facility];  Service: Urology;  Laterality: Left;  . EYE SURGERY    . HEMORRHOID SURGERY N/A 11/19/2012   Procedure: HEMORRHOIDECTOMY;  Surgeon: Jamesetta So, MD;  Location: [facility];  Service: General;  Laterality: N/A;  . kidney stones  1998  . LAPAROSCOPIC UNILATERAL SALPINGO OOPHERECTOMY  05/14/2012   Procedure: LAPAROSCOPIC UNILATERAL SALPINGO OOPHORECTOMY;  Surgeon: [name], MD;  Location: [facility];  Service: Gynecology;  Laterality: Right;  laparoscopic right salpingo-oophorectomy  . PARTIAL HYSTERECTOMY    . TONSILECTOMY, ADENOIDECTOMY, BILATERAL MYRINGOTOMY AND TUBES    . VESICOVAGINAL FISTULA CLOSURE W/ TAH        Social History:      Social History  Tobacco Use  . Smoking status: Former Smoker    Last attempt to quit: 02/27/1999    Years since quitting: 19.1  . Smokeless tobacco: Never Used  . Tobacco comment: socially   Substance Use Topics  . Alcohol use: No    Alcohol/week: 0.0 standard drinks       Family History :     Family History  Problem Relation Age of Onset  . Arthritis Unknown   . Cancer Unknown   . Diabetes Unknown   . Hypertension Mother   . Dementia Mother   . Diabetes Father   . ALS Father   . Diabetes Brother   . Hypertension Brother   . Cancer Paternal Aunt   . COPD Maternal Grandmother   . Cancer Maternal Grandfather   . Anesthesia problems Paternal Grandfather       Home Medications:   Prior to Admission medications   Medication Sig Start Date End Date Taking? Authorizing Provider  acyclovir (ZOVIRAX) 400 MG tablet Take 1 tablet (400 mg total) by mouth 2 (two) times daily. 11/01/17   Derek Jack, MD  albuterol  (PROVENTIL HFA;VENTOLIN HFA) 108 (90 Base) MCG/ACT inhaler Inhale 1-2 puffs into the lungs daily as needed. 07/18/17   Mikey Kirschner, MD  albuterol (PROVENTIL HFA;VENTOLIN HFA) 108 (90 Base) MCG/ACT inhaler Inhale into the lungs.    [provider]  allopurinol (ZYLOPRIM) 300 MG tablet TAKE 1 TABLET BY MOUTH  DAILY FOR GOUT 02/18/18   Mikey Kirschner, MD  aspirin EC 81 MG tablet Take 81 mg by mouth daily.    [provider]  bumetanide (BUMEX) 1 MG tablet Take 1 tablet (1 mg total) by mouth daily. 04/14/18   Mikey Kirschner, MD  Calcium Carb-Cholecalciferol (CALCIUM 600+D3 PO) Take by mouth.    [provider]  carvedilol (COREG) 6.25 MG tablet Take by mouth. 02/28/18   [provider]  ciprofloxacin (CIPRO) 500 MG tablet  02/13/18   [provider]  dexamethasone (DECADRON) 4 MG tablet Take 10 tablets (40 mg total) by mouth once a week. 12/23/17   Derek Jack, MD  diazepam (VALIUM) 5 MG tablet TAKE 1 TABLET BY MOUTH AT BEDTIME AS NEEDED FOR ANXIETY OR INSOMNIA 04/02/18   Mikey Kirschner, MD  Docosanol 10 % CREA Apply topically. 03/04/18   [provider]  docusate sodium (COLACE) 100 MG capsule Take by mouth.    [provider]  fluconazole (DIFLUCAN) 200 MG tablet  02/13/18   [provider]  fluticasone (FLONASE) 50 MCG/ACT nasal spray Place 2 sprays into the nose 2 (two) times daily.    [provider]  glucose blood (PRECISION QID TEST) test strip Use 1 each (1 strip total) 4 (four) times daily Use as instructed. OneTouch Verio 02/20/18 02/20/19  [provider]  HUMALOG KWIKPEN 100 UNIT/ML KiwkPen INJECT THREE TIMES DAILY BEFORE MEALS PER CORRECTION SCALE. MAX DOSE PER DAY IS 35 UNITS 02/20/18   [provider]  HYDROcodone-acetaminophen (NORCO/VICODIN) 5-325 MG tablet Take 1 tablet by mouth every 6 (six) hours as needed for moderate pain. 04/16/18   Lockamy, Randi L, NP-C  Insulin Glargine  (LANTUS SOLOSTAR) 100 UNIT/ML Solostar Pen Inject into the skin. 02/22/18   [provider]  Lactulose 20 GM/30ML SOLN Take 67m by mouth every 3-4 hours until bowel movement. Then take 320mby mouth daily. 11/01/17   KaDerek JackMD  Lancets Misc. (UPatricia Pesa NORMAL) MISC Use 1 each 4 (four) times  daily Use as instructed. 02/20/18 02/20/19  [provider]  lenalidomide (REVLIMID) 25 MG capsule Take 1 capsule by mouth daily x 14 days in a row followed by a 7 day rest period. Then resume regimen. 01/21/18   Derek Jack, MD  losartan (COZAAR) 100 MG tablet Take 1 tablet (100 mg total) by mouth daily. 07/18/17   Mikey Kirschner, MD  losartan (COZAAR) 100 MG tablet TAKE 1 TABLET BY MOUTH  DAILY 01/13/18   Mikey Kirschner, MD  losartan (COZAAR) 100 MG tablet TAKE 1 TABLET BY MOUTH  DAILY 04/14/18   Mikey Kirschner, MD  meloxicam (MOBIC) 15 MG tablet TAKE 1 TABLET BY MOUTH ONCE DAILY 08/23/17   Nilda Simmer, NP  metFORMIN (GLUCOPHAGE) 500 MG tablet TAKE 1 TABLET BY MOUTH ONCE DAILY WITH BREAKFAST 02/18/18   Mikey Kirschner, MD  nitrofurantoin, macrocrystal-monohydrate, (MACROBID) 100 MG capsule Take 100 mg by mouth daily.  02/03/17   [provider]  omeprazole (PRILOSEC) 20 MG capsule Take 1 capsule (20 mg total) by mouth daily. 02/18/18   Mikey Kirschner, MD  ondansetron (ZOFRAN) 8 MG tablet Take 1 tablet (8 mg total) by mouth every 8 (eight) hours as needed. 11/01/17   Derek Jack, MD  ONETOUCH VERIO test strip USE 1 STRIP TO CHECK GLUCOSE 4 TIMES DAILY AS DIRECTED 02/20/18   [provider]  polyethylene glycol powder (GLYCOLAX/MIRALAX) powder Take 1 Container by mouth daily.    [provider]  prochlorperazine (COMPAZINE) 10 MG tablet Take 1 tablet (10 mg total) by mouth every 6 (six) hours as needed for nausea or vomiting. 11/01/17   Derek Jack, MD  verapamil (CALAN-SR) 240 MG CR tablet Take 1 tablet (240 mg total) by  mouth at bedtime. 02/18/18   Mikey Kirschner, MD  Vitamin D, Ergocalciferol, (DRISDOL) 50000 units CAPS capsule  01/31/18   [provider]  ZARXIO 300 MCG/0.5ML SOSY  02/05/18   [provider]  ZARXIO 480 MCG/0.8ML SOSY  02/05/18   [provider]     Allergies:    No Known Allergies   Physical Exam:   Vitals  Blood pressure 128/79, pulse 100, temperature 99.9 F (37.7 C), temperature source Oral, resp. rate 16, weight 116.7 kg, SpO2 97 %.  1.  General: Appears in no acute distress  2. Psychiatric:  Intact judgement and  insight, awake alert, oriented x 3.  3. Neurologic: No focal neurological deficits, all cranial nerves intact.Strength 5/5 all 4 extremities, sensation intact all 4 extremities, plantars down going.  4. Eyes :  anicteric sclerae, moist conjunctivae with no lid lag. PERRLA.  5. ENMT:  Oropharynx clear with moist mucous membranes and good dentition  6. Neck:  supple, no cervical lymphadenopathy appriciated, No thyromegaly  7. Respiratory : Normal respiratory effort, good air movement bilaterally,clear to  auscultation bilaterally  8. Cardiovascular : RRR, no gallops, rubs or murmurs, no leg edema  9. Gastrointestinal:  Positive bowel sounds, abdomen soft, non-tender to palpation,no hepatosplenomegaly, no rigidity or guarding       10. Skin:  No cyanosis, normal texture and turgor, no rash, lesions or ulcers  11.Musculoskeletal:  Good muscle tone,  joints appear normal , no effusions,  normal range of motion    Data Review:    CBC Recent Labs  Lab 04/22/18 0910 04/25/18 1857  WBC 4.4 3.8*  HGB 12.1 10.7*  HCT 36.7 32.3*  PLT 218 176  MCV 92.0 95.8  MCH 30.3 31.8  MCHC 33.0 33.1  RDW 15.6* 15.7*  LYMPHSABS 1.7 1.7  MONOABS 0.8 0.5  EOSABS 0.1 0.0  BASOSABS 0.0 0.0   ------------------------------------------------------------------------------------------------------------------  Results for orders placed  or performed during the hospital encounter of 04/25/18 (from the past 48 hour(s))  Comprehensive metabolic panel     Status: Abnormal   Collection Time: 04/25/18  6:57 PM  Result Value Ref Range   Sodium 136 135 - 145 mmol/L   Potassium 3.7 3.5 - 5.1 mmol/L   Chloride 102 98 - 111 mmol/L   CO2 24 22 - 32 mmol/L   Glucose, Bld 126 (H) 70 - 99 mg/dL   BUN 20 6 - 20 mg/dL   Creatinine, Ser 1.15 (H) 0.44 - 1.00 mg/dL   Calcium 8.6 (L) 8.9 - 10.3 mg/dL   Total Protein 7.0 6.5 - 8.1 g/dL   Albumin 4.1 3.5 - 5.0 g/dL   AST 40 15 - 41 U/L   ALT 30 0 - 44 U/L   Alkaline Phosphatase 63 38 - 126 U/L   Total Bilirubin 0.5 0.3 - 1.2 mg/dL   GFR calc non Af Amer 53 (L) >60 mL/min   GFR calc Af Amer >60 >60 mL/min    Comment: (NOTE) The eGFR has been calculated using the CKD EPI equation. This calculation has not been validated in all clinical situations. eGFR's persistently <60 mL/min signify possible Chronic Kidney Disease.    Anion gap 10 5 - 15    Comment: Performed at [facility], [address], [place], [place] [address]  CBC with Differential     Status: Abnormal   Collection Time: 04/25/18  6:57 PM  Result Value Ref Range   WBC 3.8 (L) 4.0 - 10.5 K/uL   RBC 3.37 (L) 3.87 - 5.11 MIL/uL   Hemoglobin 10.7 (L) 12.0 - 15.0 g/dL   HCT 32.3 (L) 36.0 - 46.0 %   MCV 95.8 80.0 - 100.0 fL   MCH 31.8 26.0 - 34.0 pg   MCHC 33.1 30.0 - 36.0 g/dL   RDW 15.7 (H) 11.5 - 15.5 %   Platelets 176 150 - 400 K/uL   nRBC 0.0 0.0 - 0.2 %   Neutrophils Relative % 41 %   Neutro Abs 1.5 (L) 1.7 - 7.7 K/uL   Lymphocytes Relative 46 %   Lymphs Abs 1.7 0.7 - 4.0 K/uL   Monocytes Relative 12 %   Monocytes Absolute 0.5 0.1 - 1.0 K/uL   Eosinophils Relative 0 %   Eosinophils Absolute 0.0 0.0 - 0.5 K/uL   Basophils Relative 0 %   Basophils Absolute 0.0 0.0 - 0.1 K/uL   Immature Granulocytes 1 %   Abs Immature Granulocytes 0.02 0.00 - 0.07 K/uL    Comment: Performed at [facility], [address],  [place], [place] [address]  Protime-INR     Status: None   Collection Time: 04/25/18  6:57 PM  Result Value Ref Range   Prothrombin Time 12.6 11.4 - 15.2 seconds   INR 0.95     Comment: Performed at [facility], [address] [place], [place] [address]  Culture, blood (Routine x 2)     Status: None (Preliminary result)   Collection Time: 04/25/18  6:57 PM  Result Value Ref Range   Specimen Description      BLOOD LEFT ANTECUBITAL BOTTLES DRAWN AEROBIC AND ANAEROBIC   Special Requests      Blood Culture adequate volume Performed at [facility], [address] [place], [place]  [address]    Culture PENDING    Report Status PENDING   Culture, blood (Routine x 2)     Status: None (Preliminary result)   Collection Time: 04/25/18  7:23 PM  Result Value Ref Range   Specimen Description      BLOOD LEFT ANTECUBITAL BOTTLES DRAWN AEROBIC AND ANAEROBIC   Special Requests      Blood Culture adequate volume Performed at [facility], [address], [place], [place] [address]    Culture PENDING    Report Status PENDING   I-Stat CG4 Lactic Acid, ED  (not at  [facility])     Status: Abnormal   Collection Time: 04/25/18  7:28 PM  Result Value Ref Range   Lactic Acid, Venous 3.15 (HH) 0.5 - 1.9 mmol/L   Comment NOTIFIED PHYSICIAN   Urinalysis, Routine w reflex microscopic     Status: Abnormal   Collection Time: 04/25/18  7:45 PM  Result Value Ref Range   Color, Urine YELLOW YELLOW   APPearance HAZY (A) CLEAR   Specific Gravity, Urine 1.018 1.005 - 1.030   pH 5.0 5.0 - 8.0   Glucose, UA NEGATIVE NEGATIVE mg/dL   Hgb urine dipstick NEGATIVE NEGATIVE   Bilirubin Urine NEGATIVE NEGATIVE   Ketones, ur NEGATIVE NEGATIVE mg/dL   Protein, ur NEGATIVE NEGATIVE mg/dL   Nitrite NEGATIVE NEGATIVE   Leukocytes, UA NEGATIVE NEGATIVE    Comment: Performed at [facility], [address] [place], [place] [address]    Chemistries  Recent Labs  Lab 04/22/18 0910 04/25/18 1857  NA 141 136  K 3.4* 3.7  CL  105 102  CO2 27 24  GLUCOSE 153* 126*  BUN 19 20  CREATININE 1.07* 1.15*  CALCIUM 8.4* 8.6*  MG 0.9*  --   AST 54* 40  ALT 31 30  ALKPHOS 67 63  BILITOT 0.9 0.5   ------------------------------------------------------------------------------------------------------------------  ------------------------------------------------------------------------------------------------------------------ GFR: Estimated Creatinine Clearance: 71 mL/min (A) (by C-G formula based on SCr of 1.15 mg/dL (H)). Liver Function Tests: Recent Labs  Lab 04/22/18 0910 04/25/18 1857  AST 54* 40  ALT 31 30  ALKPHOS 67 63  BILITOT 0.9 0.5  PROT 7.2 7.0  ALBUMIN 4.2 4.1   No results for input(s): LIPASE, AMYLASE in the last 168 hours. No results for input(s): AMMONIA in the last 168 hours. Coagulation Profile: Recent Labs  Lab 04/25/18 1857  INR 0.95    --------------------------------------------------------------------------------------------------------------- Urine analysis:    Component Value Date/Time   COLORURINE YELLOW 04/25/2018 1945   APPEARANCEUR HAZY (A) 04/25/2018 1945   LABSPEC 1.018 04/25/2018 1945   PHURINE 5.0 04/25/2018 1945   GLUCOSEU NEGATIVE 04/25/2018 1945   HGBUR NEGATIVE 04/25/2018 1945   BILIRUBINUR NEGATIVE 04/25/2018 1945   BILIRUBINUR ++ 02/10/2013 0843   KETONESUR NEGATIVE 04/25/2018 1945   PROTEINUR NEGATIVE 04/25/2018 1945   UROBILINOGEN 0.2 05/07/2012 0829   NITRITE NEGATIVE 04/25/2018 1945   LEUKOCYTESUR NEGATIVE 04/25/2018 1945      Imaging Results:    Dg Chest 2 View  Result Date: 04/25/2018 CLINICAL DATA:  Shortness of breath EXAM: CHEST - 2 VIEW COMPARISON:  Chest radiograph 02/11/2017 FINDINGS: The heart size and mediastinal contours are within normal limits. Both lungs are clear. The visualized skeletal structures are unremarkable. IMPRESSION: No active cardiopulmonary disease. Electronically Signed   By: Ulyses Jarred M.D.   On: 04/25/2018 20:11      My personal review of EKG: Rhythm NSR, no ST-T changes.   Assessment & Plan:  Active Problems:   Sepsis ([place])   1. ?  Sepsis-due to history of multiple myeloma, status post recent stem cell transplant, elevated lactic acid, patient has been empirically started on vancomycin, cefepime, Flagyl.  Follow blood culture results and antibiotics can be narrowed down in next 24 hours based on patient's clinical condition.  Patient blood pressure stable so would not give her fluid boluses.  Continue with 1 L fluid bolus in the ED and then 75 mL/h of normal saline.  2. Acute bronchitis-patient presenting with symptoms of cough with yellow phlegm.  She improved with albuterol.  We will continue with duo nebs every 6 hours.  3. Diabetes mellitus type 2-continue Lantus 15 units subcu daily, sliding scale insulin with NovoLog.  Hold metformin.  4. History of multiple myeloma status post stem cell transplant-patient is currently off chemotherapy she is supposed to follow-up with oncologist in November to restart chemotherapy for multiple myeloma.  5. Hypertension-continue Coreg, verapamil from tomorrow morning.    DVT Prophylaxis-   Lovenox   AM Labs Ordered, also please review Full Orders  Family Communication: Admission, patients condition and plan of care including tests being ordered have been discussed with the patient  who indicate understanding and agree with the plan and Code Status.  Code Status: Full code  Admission status: Inpatient: Based on patients clinical presentation and evaluation of above clinical data, I have made determination that patient meets Inpatient criteria at this time.  Patient admitted for possible sepsis, requiring IV antibiotics, recent stem cell transplant.  Lactic acid elevated  Time spent in minutes : 60 minutes   Oswald Hillock M.D on 04/25/2018 at 9:14 PM  Between 7am to 7pm - Pager - [phone]. After 7pm go to [email or web address] - password [facility] - Office  [phone]

## 2018-04-25 NOTE — ED Provider Notes (Addendum)
[facility] EMERGENCY DEPARTMENT Provider Note   CSN: [id number] Arrival date & time: 04/25/18  1824     History   Chief Complaint Chief Complaint  Patient presents with  . Fever    HPI Joanna Reid is a 52 y.o. female.  She is a history of multiple myeloma and had a stem cell transplant last month.  She follows with Dr. Delton Coombes here in [place] for her oncologist.  She saw him on Tuesday and received a dose of Zometa which is supposed to strengthen her bones.  Tuesday evening she began experiencing a low-grade fever which she attributed to the medication.  She is had a fever each of the last 4 days mostly in the evenings.  Its been responsive to Tylenol and ibuprofen.  For the last couple days she has had some more chest congestion and cough along with a hoarse voice.  Fever maximum is been 101.  She did not try to reach her oncologist or primary care doctor for this.  No nausea no vomiting no diarrhea no urinary symptoms.  No rashes or swollen joints.  The history is provided by the patient.  Fever   Episode onset: 4 days. The problem occurs daily. The problem has not changed since onset.The maximum temperature noted was 101 to 101.9 F. Associated symptoms include chest pain, congestion, sore throat and cough. Pertinent negatives include no diarrhea, no vomiting and no headaches. She has tried acetaminophen and ibuprofen for the symptoms. The treatment provided moderate relief.    Past Medical History:  Diagnosis Date  . Acid reflux   . Allergic rhinitis   . Diabetes mellitus    type 2  . Gout   . Gout   . HBP (high blood pressure)   . History of kidney stones   . Migraines     Patient Active Problem List   Diagnosis Date Noted  . Goals of care, counseling/discussion 11/01/2017  . Multiple myeloma ([place]) 10/29/2017  . Hypercalcemia 10/29/2017  . Type 2 diabetes mellitus without complication, without long-term current use of insulin ([place]) 01/27/2016  . Morbid  obesity due to excess calories ([place]) 01/27/2016  . Anxiety as acute reaction to exceptional stress 01/13/2015  . Attention deficit hyperactivity disorder (ADHD), 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  . Gout 01/20/2014  . GERD (gastroesophageal reflux disease) 01/20/2014  . Essential hypertension, benign 01/20/2014  . Plantar fasciitis of left foot 09/19/2011    Past Surgical History:  Procedure Laterality Date  . CESAREAN SECTION    . EXTRACORPOREAL SHOCK WAVE LITHOTRIPSY Left 10/10/2017   Procedure: LEFT EXTRACORPOREAL SHOCK WAVE LITHOTRIPSY (ESWL);  Surgeon: Bjorn Loser, MD;  Location: [facility];  Service: Urology;  Laterality: Left;  . EYE SURGERY    . HEMORRHOID SURGERY N/A 11/19/2012   Procedure: HEMORRHOIDECTOMY;  Surgeon: Jamesetta So, MD;  Location: [facility];  Service: General;  Laterality: N/A;  . kidney stones  1998  . LAPAROSCOPIC UNILATERAL SALPINGO OOPHERECTOMY  05/14/2012   Procedure: LAPAROSCOPIC UNILATERAL SALPINGO OOPHORECTOMY;  Surgeon: [name], MD;  Location: [facility];  Service: Gynecology;  Laterality: Right;  laparoscopic right salpingo-oophorectomy  . PARTIAL HYSTERECTOMY    . TONSILECTOMY, ADENOIDECTOMY, BILATERAL MYRINGOTOMY AND TUBES    . VESICOVAGINAL FISTULA CLOSURE W/ TAH       OB History   None      Home Medications    Prior to Admission medications   Medication Sig Start Date End Date Taking? Authorizing Provider  acyclovir (ZOVIRAX) 400 MG tablet  Take 1 tablet (400 mg total) by mouth 2 (two) times daily. 11/01/17   Derek Jack, MD  albuterol (PROVENTIL HFA;VENTOLIN HFA) 108 (90 Base) MCG/ACT inhaler Inhale 1-2 puffs into the lungs daily as needed. 07/18/17   Mikey Kirschner, MD  albuterol (PROVENTIL HFA;VENTOLIN HFA) 108 (90 Base) MCG/ACT inhaler Inhale into the lungs.    [provider]  allopurinol (ZYLOPRIM) 300 MG tablet TAKE 1 TABLET BY MOUTH  DAILY FOR GOUT 02/18/18   Mikey Kirschner, MD  aspirin EC 81 MG tablet Take 81 mg by mouth  daily.    [provider]  bumetanide (BUMEX) 1 MG tablet Take 1 tablet (1 mg total) by mouth daily. 04/14/18   Mikey Kirschner, MD  Calcium Carb-Cholecalciferol (CALCIUM 600+D3 PO) Take by mouth.    [provider]  carvedilol (COREG) 6.25 MG tablet Take by mouth. 02/28/18   [provider]  ciprofloxacin (CIPRO) 500 MG tablet  02/13/18   [provider]  dexamethasone (DECADRON) 4 MG tablet Take 10 tablets (40 mg total) by mouth once a week. 12/23/17   Derek Jack, MD  diazepam (VALIUM) 5 MG tablet TAKE 1 TABLET BY MOUTH AT BEDTIME AS NEEDED FOR ANXIETY OR INSOMNIA 04/02/18   Mikey Kirschner, MD  Docosanol 10 % CREA Apply topically. 03/04/18   [provider]  docusate sodium (COLACE) 100 MG capsule Take by mouth.    [provider]  fluconazole (DIFLUCAN) 200 MG tablet  02/13/18   [provider]  fluticasone (FLONASE) 50 MCG/ACT nasal spray Place 2 sprays into the nose 2 (two) times daily.    [provider]  glucose blood (PRECISION QID TEST) test strip Use 1 each (1 strip total) 4 (four) times daily Use as instructed. OneTouch Verio 02/20/18 02/20/19  [provider]  HUMALOG KWIKPEN 100 UNIT/ML KiwkPen INJECT THREE TIMES DAILY BEFORE MEALS PER CORRECTION SCALE. MAX DOSE PER DAY IS 35 UNITS 02/20/18   [provider]  HYDROcodone-acetaminophen (NORCO/VICODIN) 5-325 MG tablet Take 1 tablet by mouth every 6 (six) hours as needed for moderate pain. 04/16/18   Lockamy, Randi L, NP-C  Insulin Glargine (LANTUS SOLOSTAR) 100 UNIT/ML Solostar Pen Inject into the skin. 02/22/18   [provider]  Lactulose 20 GM/30ML SOLN Take 9m by mouth every 3-4 hours until bowel movement. Then take 330mby mouth daily. 11/01/17   KaDerek JackMD  Lancets Misc. (UPatricia Pesa NORMAL) MISC Use 1 each 4 (four) times daily Use as instructed. 02/20/18 02/20/19  [provider]  lenalidomide (REVLIMID) 25 MG  capsule Take 1 capsule by mouth daily x 14 days in a row followed by a 7 day rest period. Then resume regimen. 01/21/18   KaDerek JackMD  losartan (COZAAR) 100 MG tablet Take 1 tablet (100 mg total) by mouth daily. 07/18/17   LuMikey KirschnerMD  losartan (COZAAR) 100 MG tablet TAKE 1 TABLET BY MOUTH  DAILY 01/13/18   LuMikey KirschnerMD  losartan (COZAAR) 100 MG tablet TAKE 1 TABLET BY MOUTH  DAILY 04/14/18   LuMikey KirschnerMD  meloxicam (MOBIC) 15 MG tablet TAKE 1 TABLET BY MOUTH ONCE DAILY 08/23/17   HoNilda SimmerNP  metFORMIN (GLUCOPHAGE) 500 MG tablet TAKE 1 TABLET BY MOUTH ONCE DAILY WITH BREAKFAST 02/18/18   LuMikey KirschnerMD  nitrofurantoin, macrocrystal-monohydrate, (MACROBID) 100 MG capsule Take 100 mg by mouth daily.  02/03/17   [provider]  omeprazole (PR[place]20  MG capsule Take 1 capsule (20 mg total) by mouth daily. 02/18/18   Mikey Kirschner, MD  ondansetron (ZOFRAN) 8 MG tablet Take 1 tablet (8 mg total) by mouth every 8 (eight) hours as needed. 11/01/17   Derek Jack, MD  ONETOUCH VERIO test strip USE 1 STRIP TO CHECK GLUCOSE 4 TIMES DAILY AS DIRECTED 02/20/18   [provider]  polyethylene glycol powder (GLYCOLAX/MIRALAX) powder Take 1 Container by mouth daily.    [provider]  prochlorperazine (COMPAZINE) 10 MG tablet Take 1 tablet (10 mg total) by mouth every 6 (six) hours as needed for nausea or vomiting. 11/01/17   Derek Jack, MD  verapamil (CALAN-SR) 240 MG CR tablet Take 1 tablet (240 mg total) by mouth at bedtime. 02/18/18   Mikey Kirschner, MD  Vitamin D, Ergocalciferol, (DRISDOL) 50000 units CAPS capsule  01/31/18   [provider]  ZARXIO 300 MCG/0.5ML SOSY  02/05/18   [provider]  ZARXIO 480 MCG/0.8ML SOSY  02/05/18   [provider]    Family History Family History  Problem Relation Age of Onset  . Arthritis Unknown   . Cancer Unknown   . Diabetes Unknown   .  Hypertension Mother   . Dementia Mother   . Diabetes Father   . ALS Father   . Diabetes Brother   . Hypertension Brother   . Cancer Paternal Aunt   . COPD Maternal Grandmother   . Cancer Maternal Grandfather   . Anesthesia problems Paternal Grandfather     Social History Social History   Tobacco Use  . Smoking status: Former Smoker    Last attempt to quit: 02/27/1999    Years since quitting: 19.1  . Smokeless tobacco: Never Used  . Tobacco comment: socially   Substance Use Topics  . Alcohol use: No    Alcohol/week: 0.0 standard drinks  . Drug use: No     Allergies   Patient has no known allergies.   Review of Systems Review of Systems  Constitutional: Positive for diaphoresis and fever.  HENT: Positive for congestion and sore throat.   Eyes: Negative for visual disturbance.  Respiratory: Positive for cough and wheezing. Negative for shortness of breath.   Cardiovascular: Positive for chest pain. Negative for palpitations.  Gastrointestinal: Negative for abdominal pain, diarrhea and vomiting.  Genitourinary: Negative for dysuria.  Musculoskeletal: Positive for back pain.  Skin: Negative for rash.  Allergic/Immunologic: Positive for immunocompromised state.  Neurological: Negative for headaches.     Physical Exam Updated Vital Signs BP (!) 124/93 (BP Location: Right Arm)   Pulse (!) 102   Temp (!) 100.6 F (38.1 C) (Oral)   Resp 19   Wt 116.7 kg   SpO2 100%   BMI 44.18 kg/m   Physical Exam  Constitutional: She is oriented to person, place, and time. She appears well-developed and well-nourished. No distress.  HENT:  Head: Normocephalic and atraumatic.  Eyes: Conjunctivae are normal.  Neck: Neck supple.  Cardiovascular: Regular rhythm. Tachycardia present.  No murmur heard. Pulmonary/Chest: Effort normal. No respiratory distress. She has wheezes (few scattered).  Abdominal: Soft. There is no tenderness.  Musculoskeletal: Normal range of motion. She  exhibits no edema, tenderness or deformity.  Neurological: She is alert and oriented to person, place, and time. She has normal strength. GCS eye subscore is 4. GCS verbal subscore is 5. GCS motor subscore is 6.  Skin: Skin is warm and dry.  Psychiatric: She has a normal mood and  affect.  Nursing note and vitals reviewed.    ED Treatments / Results  Labs (all labs ordered are listed, but only abnormal results are displayed) Labs Reviewed  MRSA PCR SCREENING - Abnormal; Notable for the following components:      Result Value   MRSA by PCR POSITIVE (*)    All other components within normal limits  COMPREHENSIVE METABOLIC PANEL - Abnormal; Notable for the following components:   Glucose, Bld 126 (*)    Creatinine, Ser 1.15 (*)    Calcium 8.6 (*)    GFR calc non Af Amer 53 (*)    All other components within normal limits  CBC WITH DIFFERENTIAL/PLATELET - Abnormal; Notable for the following components:   WBC 3.8 (*)    RBC 3.37 (*)    Hemoglobin 10.7 (*)    HCT 32.3 (*)    RDW 15.7 (*)    Neutro Abs 1.5 (*)    All other components within normal limits  URINALYSIS, ROUTINE W REFLEX MICROSCOPIC - Abnormal; Notable for the following components:   APPearance HAZY (*)    All other components within normal limits  CBC - Abnormal; Notable for the following components:   WBC 3.1 (*)    RBC 3.03 (*)    Hemoglobin 9.6 (*)    HCT 29.5 (*)    RDW 15.8 (*)    All other components within normal limits  COMPREHENSIVE METABOLIC PANEL - Abnormal; Notable for the following components:   Potassium 3.4 (*)    Glucose, Bld 110 (*)    Calcium 7.8 (*)    Total Protein 5.8 (*)    Albumin 3.4 (*)    All other components within normal limits  GLUCOSE, CAPILLARY - Abnormal; Notable for the following components:   Glucose-Capillary 111 (*)    All other components within normal limits  I-STAT CG4 LACTIC ACID, ED - Abnormal; Notable for the following components:   Lactic Acid, Venous 3.15 (*)    All  other components within normal limits  CULTURE, BLOOD (ROUTINE X 2)  CULTURE, BLOOD (ROUTINE X 2)  URINE CULTURE  PROTIME-INR  LACTIC ACID, PLASMA  LACTIC ACID, PLASMA  HEMOGLOBIN A1C  HIV ANTIBODY (ROUTINE TESTING W REFLEX)  I-STAT CG4 LACTIC ACID, ED    EKG EKG Interpretation  Date/Time:  Friday April 25 2018 18:32:50 EDT Ventricular Rate:  102 PR Interval:  168 QRS Duration: 72 QT Interval:  350 QTC Calculation: 456 R Axis:     Text Interpretation:  Sinus tachycardia Cannot rule out Anterior infarct , age undetermined Abnormal ECG Since last tracing rate faster Confirmed by Pryor Curia [phone]) on 04/26/2018 10:05:36 AM   Radiology Dg Chest 2 View  Result Date: 04/25/2018 CLINICAL DATA:  Shortness of breath EXAM: CHEST - 2 VIEW COMPARISON:  Chest radiograph 02/11/2017 FINDINGS: The heart size and mediastinal contours are within normal limits. Both lungs are clear. The visualized skeletal structures are unremarkable. IMPRESSION: No active cardiopulmonary disease. Electronically Signed   By: Ulyses Jarred M.D.   On: 04/25/2018 20:11    Procedures .Critical Care Performed by: Hayden Rasmussen, MD Authorized by: Hayden Rasmussen, MD   Critical care provider statement:    Critical care time (minutes):  30   Critical care time was exclusive of:  Separately billable procedures and treating other patients   Critical care was necessary to treat or prevent imminent or life-threatening deterioration of the following conditions:  Sepsis   Critical care was time spent personally  by me on the following activities:  Discussions with consultants, evaluation of patient's response to treatment, examination of patient, ordering and performing treatments and interventions, ordering and review of laboratory studies, ordering and review of radiographic studies, pulse oximetry, re-evaluation of patient's condition, obtaining history from patient or surrogate, review of old charts and  development of treatment plan with patient or surrogate   I assumed direction of critical care for this patient from another provider in my specialty: no     (including critical care time)  Medications Ordered in ED Medications  sodium chloride 0.9 % bolus 1,000 mL (has no administration in time range)  albuterol (PROVENTIL) (2.5 MG/3ML) 0.083% nebulizer solution 2.5 mg (has no administration in time range)  acetaminophen (TYLENOL) tablet 650 mg (has no administration in time range)     Initial Impression / Assessment and Plan / ED Course  I have reviewed the triage vital signs and the nursing notes.  Pertinent labs & imaging results that were available during my care of the patient were reviewed by me and considered in my medical decision making (see chart for details).  Clinical Course as of Apr 26 1257  Fri Apr 25, 2018  2032 Patient's here with fever and immunocompromise with multiple myeloma and chemotherapy.  She does not have an obvious source by her work-up.  Her initial lactate is 3.15.  I have ordered her empiric antibiotics for unknown source.  I discussed with Dr. Darrick Meigs from the hospitalist service who is asking me to interface with oncology for recommendations on the way she would be better served by being transferred in to [facility]   [MB]  2035 Discussed with Dr. Walden Field from oncology who feels the patient could stay here and she would recommend observation cover with antibiotic possibly Levaquin and follow cultures.   [MB]    Clinical Course User Index [MB] Hayden Rasmussen, MD     Final Clinical Impressions(s) / ED Diagnoses   Final diagnoses:  Fever in adult  Multiple myeloma not having achieved remission [facility])    ED Discharge Orders    None       Hayden Rasmussen, MD 04/26/18 1300    Hayden Rasmussen, MD 05/02/18 [phone]

## 2018-04-26 ENCOUNTER — Other Ambulatory Visit: Payer: Self-pay

## 2018-04-26 ENCOUNTER — Encounter (HOSPITAL_COMMUNITY): Payer: Self-pay | Admitting: *Deleted

## 2018-04-26 DIAGNOSIS — A419 Sepsis, unspecified organism: Principal | ICD-10-CM

## 2018-04-26 LAB — COMPREHENSIVE METABOLIC PANEL
ALBUMIN: 3.4 g/dL — AB (ref 3.5–5.0)
ALT: 24 U/L (ref 0–44)
AST: 41 U/L (ref 15–41)
Alkaline Phosphatase: 52 U/L (ref 38–126)
Anion gap: 7 (ref 5–15)
BUN: 18 mg/dL (ref 6–20)
CALCIUM: 7.8 mg/dL — AB (ref 8.9–10.3)
CO2: 23 mmol/L (ref 22–32)
CREATININE: 0.92 mg/dL (ref 0.44–1.00)
Chloride: 109 mmol/L (ref 98–111)
GFR calc Af Amer: >60 mL/min (ref >60)
Glucose, Bld: 110 mg/dL — ABNORMAL HIGH (ref 70–99)
POTASSIUM: 3.4 mmol/L — AB (ref 3.5–5.1)
SODIUM: 139 mmol/L (ref 135–145)
TOTAL PROTEIN: 5.8 g/dL — AB (ref 6.5–8.1)
Total Bilirubin: 0.8 mg/dL (ref 0.3–1.2)

## 2018-04-26 LAB — HEMOGLOBIN A1C
HEMOGLOBIN A1C: 6.3 % — AB (ref 4.8–5.6)
Mean Plasma Glucose: 134.11 mg/dL

## 2018-04-26 LAB — CBC
HCT: 29.5 % — ABNORMAL LOW (ref 36.0–46.0)
Hemoglobin: 9.6 g/dL — ABNORMAL LOW (ref 12.0–15.0)
MCH: 31.7 pg (ref 26.0–34.0)
MCHC: 32.5 g/dL (ref 30.0–36.0)
MCV: 97.4 fL (ref 80.0–100.0)
NRBC: 0 % (ref 0.0–0.2)
PLATELETS: 152 10*3/uL (ref 150–400)
RBC: 3.03 MIL/uL — AB (ref 3.87–5.11)
RDW: 15.8 % — AB (ref 11.5–15.5)
WBC: 3.1 10*3/uL — AB (ref 4.0–10.5)

## 2018-04-26 LAB — GLUCOSE, CAPILLARY: GLUCOSE-CAPILLARY: 111 mg/dL — AB (ref 70–99)

## 2018-04-26 LAB — MRSA PCR SCREENING: MRSA by PCR: POSITIVE — AB

## 2018-04-26 LAB — LACTIC ACID, PLASMA
LACTIC ACID, VENOUS: 1.8 mmol/L (ref 0.5–1.9)
Lactic Acid, Venous: 1.6 mmol/L (ref 0.5–1.9)

## 2018-04-26 MED ORDER — LEVOFLOXACIN 750 MG PO TABS: 750.0000 mg | ORAL_TABLET | Freq: Every day | ORAL | 0 refills | Status: DC

## 2018-04-26 MED ORDER — LEVOFLOXACIN 750 MG PO TABS: 750.0000 mg | ORAL_TABLET | Freq: Every day | ORAL | 0 refills | Status: AC

## 2018-04-26 MED ORDER — ENOXAPARIN SODIUM 40 MG/0.4ML ~~LOC~~ SOLN
40.0000 mg | SUBCUTANEOUS | Status: DC
  Filled 2018-04-26: qty 0.4

## 2018-04-26 MED ORDER — ASPIRIN EC 81 MG PO TBEC
81.0000 mg | DELAYED_RELEASE_TABLET | Freq: Every day | ORAL | Status: DC
  Administered 2018-04-26: 81 mg via ORAL
  Filled 2018-04-26: qty 1

## 2018-04-26 MED ORDER — SODIUM CHLORIDE 0.9 % IV SOLN
INTRAVENOUS | Status: DC
  Administered 2018-04-26: via INTRAVENOUS

## 2018-04-26 MED ORDER — INSULIN GLARGINE 100 UNIT/ML ~~LOC~~ SOLN: 15.0000 [IU] | Freq: Every day | SUBCUTANEOUS | 11 refills | Status: DC

## 2018-04-26 MED ORDER — ONDANSETRON HCL 4 MG PO TABS: 4.0000 mg | ORAL_TABLET | Freq: Four times a day (QID) | ORAL | Status: DC | PRN

## 2018-04-26 MED ORDER — ACYCLOVIR 800 MG PO TABS
400.0000 mg | ORAL_TABLET | Freq: Two times a day (BID) | ORAL | Status: DC
  Administered 2018-04-26 (×2): 400 mg via ORAL
  Filled 2018-04-26 (×2): qty 1

## 2018-04-26 MED ORDER — IPRATROPIUM-ALBUTEROL 0.5-2.5 (3) MG/3ML IN SOLN
3.0000 mL | Freq: Four times a day (QID) | RESPIRATORY_TRACT | Status: DC
  Administered 2018-04-26 (×2): 3 mL via RESPIRATORY_TRACT
  Filled 2018-04-26 (×2): qty 3

## 2018-04-26 MED ORDER — VERAPAMIL HCL ER 240 MG PO TBCR: 240.0000 mg | EXTENDED_RELEASE_TABLET | Freq: Every day | ORAL | Status: DC

## 2018-04-26 MED ORDER — SODIUM CHLORIDE 0.9 % IV SOLN
INTRAVENOUS | Status: AC
  Filled 2018-04-26: qty 2

## 2018-04-26 MED ORDER — INSULIN ASPART 100 UNIT/ML ~~LOC~~ SOLN: 0.0000 [IU] | Freq: Three times a day (TID) | SUBCUTANEOUS | Status: DC

## 2018-04-26 MED ORDER — INSULIN GLARGINE 100 UNIT/ML ~~LOC~~ SOLN
15.0000 [IU] | Freq: Every day | SUBCUTANEOUS | Status: DC
  Filled 2018-04-26 (×3): qty 0.15

## 2018-04-26 MED ORDER — HYDROCODONE-ACETAMINOPHEN 5-325 MG PO TABS: 1.0000 | ORAL_TABLET | Freq: Four times a day (QID) | ORAL | Status: DC | PRN

## 2018-04-26 MED ORDER — ONDANSETRON HCL 4 MG/2ML IJ SOLN: 4.0000 mg | Freq: Four times a day (QID) | INTRAMUSCULAR | Status: DC | PRN

## 2018-04-26 MED ORDER — GUAIFENESIN ER 600 MG PO TB12
600.0000 mg | ORAL_TABLET | Freq: Two times a day (BID) | ORAL | Status: DC
  Administered 2018-04-26 (×2): 600 mg via ORAL
  Filled 2018-04-26 (×2): qty 1

## 2018-04-26 MED ORDER — CARVEDILOL 3.125 MG PO TABS
6.2500 mg | ORAL_TABLET | Freq: Two times a day (BID) | ORAL | Status: DC
  Administered 2018-04-26: 6.25 mg via ORAL
  Filled 2018-04-26: qty 2

## 2018-04-26 MED ORDER — PANTOPRAZOLE SODIUM 40 MG PO TBEC
40.0000 mg | DELAYED_RELEASE_TABLET | Freq: Every day | ORAL | Status: DC
  Administered 2018-04-26: 40 mg via ORAL
  Filled 2018-04-26: qty 1

## 2018-04-26 NOTE — Discharge Summary (Signed)
Physician Discharge Summary  Joanna Reid:[id number] DOB: Aug 04, 1965 DOA: 04/25/2018  PCP: Mikey Kirschner, MD  Admit date: 04/25/2018 Discharge date: 04/26/2018  Time spent: 45 minutes  Recommendations for Outpatient Follow-up:  -To be discharged home today. -Will need to complete a 5-day course of Levaquin. -Patient advised to follow-up with her oncologist as scheduled for next week. -She has been told that she will be called if blood cultures returned positive.  Discharge Diagnoses:  Active Problems:   Sepsis ([place]) Viral URI  Discharge Condition: Stable and improved  Filed Weights   04/25/18 1835 04/25/18 2352  Weight: 116.7 kg 122.3 kg    History of present illness:  As per Dr. Darrick Meigs on 10/18: Joanna Reid  is a 52 y.o. 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  Patient took Macrobid for past 2 years to prevent UTI from 12 mm kidney stone. Patient also has type II diabetes mellitus, takes Lantus, metformin at home.  History of LVH, hypertension, GERD, gout. Patient came to the ED today as she developed fever, T-max 101, for past 4 days.  Also has been coughing up yellow phlegm.  She denies chest pain but admits to having mild shortness of breath.  Denies dysuria urgency or frequency of urination.  Denies abdominal pain.  No flank pain.  Denies hematuria. Denies nausea vomiting or diarrhea. In the ED chest x-ray showed no infiltrate.  UA is clear. Lactic acid was mildly elevated 3.15 so patient started on empiric antibiotics including vancomycin, cefepime, Flagyl for possible sepsis.  Urine and blood cultures have been obtained. [facility] oncologist Dr. Walden Field was consulted by ED physician who recommended that patient can stay at [facility] hospital with IV  antibiotics and follow blood culture results. No previous history of stroke or seizures. No history of CAD.  Hospital Course:   Early sepsis -Etiology remains unclear but I do suspect given her symptoms of cough, runny nose that this represents a viral URI. -She has had no temperature while in the hospital, urine analysis is negative, chest x-ray is negative, blood cultures are negative at 24 hours. -She is not neutropenic, has a WBC count of 3.1. -Believe it is safe for discharge home today.  Will allow 5 days of Levaquin as she had a bone marrow transplant in August. -She will be notified if her blood cultures return positive, if so she will need return trip to the emergency department.  Rest of chronic medical conditions have been stable during this short hospitalization.  Procedures:  None  Consultations:  None  Discharge Instructions  Discharge Instructions    Increase activity slowly   Complete by:  As directed      Allergies as of 04/26/2018   No Known Allergies     Medication List    STOP taking these medications   ciprofloxacin 500 MG tablet Commonly known as:  CIPRO   fluconazole 200 MG tablet Commonly known as:  DIFLUCAN   LANTUS SOLOSTAR 100 UNIT/ML Solostar Pen Generic drug:  Insulin Glargine Replaced by:  insulin glargine 100 UNIT/ML injection   nitrofurantoin (macrocrystal-monohydrate) 100 MG capsule Commonly known as:  MACROBID   ZARXIO 300 MCG/0.5ML Sosy Generic drug:  Filgrastim-sndz   ZARXIO 480 MCG/0.8ML Sosy Generic drug:  Filgrastim-sndz     TAKE  these medications   acyclovir 400 MG tablet Commonly known as:  ZOVIRAX Take 1 tablet (400 mg total) by mouth 2 (two) times daily.   albuterol 108 (90 Base) MCG/ACT inhaler Commonly known as:  PROVENTIL HFA;VENTOLIN HFA Inhale 1-2 puffs into the lungs daily as needed. What changed:  Another medication with the same name was removed. Continue taking this medication, and follow the  directions you see here.   allopurinol 300 MG tablet Commonly known as:  ZYLOPRIM TAKE 1 TABLET BY MOUTH  DAILY FOR GOUT   aspirin EC 81 MG tablet Take 81 mg by mouth daily.   bumetanide 1 MG tablet Commonly known as:  BUMEX Take 1 tablet (1 mg total) by mouth daily.   CALCIUM 600+D3 PO Take by mouth.   carvedilol 6.25 MG tablet Commonly known as:  COREG Take by mouth.   dexamethasone 4 MG tablet Commonly known as:  DECADRON Take 10 tablets (40 mg total) by mouth once a week.   diazepam 5 MG tablet Commonly known as:  VALIUM TAKE 1 TABLET BY MOUTH AT BEDTIME AS NEEDED FOR ANXIETY OR INSOMNIA   Docosanol 10 % Crea Apply topically.   docusate sodium 100 MG capsule Commonly known as:  COLACE Take by mouth.   fluticasone 50 MCG/ACT nasal spray Commonly known as:  FLONASE Place 2 sprays into the nose 2 (two) times daily.   HUMALOG KWIKPEN 100 UNIT/ML KiwkPen Generic drug:  insulin lispro INJECT THREE TIMES DAILY BEFORE MEALS PER CORRECTION SCALE. MAX DOSE PER DAY IS 35 UNITS   HYDROcodone-acetaminophen 5-325 MG tablet Commonly known as:  NORCO/VICODIN Take 1 tablet by mouth every 6 (six) hours as needed for moderate pain.   insulin glargine 100 UNIT/ML injection Commonly known as:  LANTUS Inject 0.15 mLs (15 Units total) into the skin at bedtime. Replaces:  LANTUS SOLOSTAR 100 UNIT/ML Solostar Pen   Lactulose 20 GM/30ML Soln Take 24m by mouth every 3-4 hours until bowel movement. Then take 370mby mouth daily.   lenalidomide 25 MG capsule Commonly known as:  REVLIMID Take 1 capsule by mouth daily x 14 days in a row followed by a 7 day rest period. Then resume regimen.   levofloxacin 750 MG tablet Commonly known as:  LEVAQUIN Take 1 tablet (750 mg total) by mouth daily for 5 days.   losartan 100 MG tablet Commonly known as:  COZAAR Take 1 tablet (100 mg total) by mouth daily. What changed:  Another medication with the same name was removed. Continue taking  this medication, and follow the directions you see here.   meloxicam 15 MG tablet Commonly known as:  MOBIC TAKE 1 TABLET BY MOUTH ONCE DAILY   metFORMIN 500 MG tablet Commonly known as:  GLUCOPHAGE TAKE 1 TABLET BY MOUTH ONCE DAILY WITH BREAKFAST   omeprazole 20 MG capsule Commonly known as:  PRILOSEC Take 1 capsule (20 mg total) by mouth daily.   ondansetron 8 MG tablet Commonly known as:  ZOFRAN Take 1 tablet (8 mg total) by mouth every 8 (eight) hours as needed.   polyethylene glycol powder powder Commonly known as:  GLYCOLAX/MIRALAX Take 1 Container by mouth daily.   PRECISION QID TEST test strip Generic drug:  glucose blood Use 1 each (1 strip total) 4 (four) times daily Use as instructed. OneTouch Verio   ONETOUCH VERIO test strip Generic drug:  glucose blood USE 1 STRIP TO CHECK GLUCOSE 4 TIMES DAILY AS DIRECTED   prochlorperazine 10 MG tablet Commonly known  as:  COMPAZINE Take 1 tablet (10 mg total) by mouth every 6 (six) hours as needed for nausea or vomiting.   UNISTIK 2 NORMAL Misc Use 1 each 4 (four) times daily Use as instructed.   verapamil 240 MG CR tablet Commonly known as:  CALAN-SR Take 1 tablet (240 mg total) by mouth at bedtime.   Vitamin D (Ergocalciferol) 50000 units Caps capsule Commonly known as:  DRISDOL      No Known Allergies Follow-up Information    Mikey Kirschner, MD. Schedule an appointment as soon as possible for a visit in 2 week(s).   Specialty:  Family Medicine Contact information: [place] [address] [phone]        Derek Jack, MD. Schedule an appointment as soon as possible for a visit in 1 week(s).   Specialty:  Hematology Contact information: [address] [place] [address] [phone]            The results of significant diagnostics from this hospitalization (including imaging, microbiology, ancillary and laboratory) are listed below for reference.     Significant Diagnostic Studies: Dg Chest 2 View  Result Date: 04/25/2018 CLINICAL DATA:  Shortness of breath EXAM: CHEST - 2 VIEW COMPARISON:  Chest radiograph 02/11/2017 FINDINGS: The heart size and mediastinal contours are within normal limits. Both lungs are clear. The visualized skeletal structures are unremarkable. IMPRESSION: No active cardiopulmonary disease. Electronically Signed   By: Ulyses Jarred M.D.   On: 04/25/2018 20:11    Microbiology: Recent Results (from the past 240 hour(s))  Culture, blood (Routine x 2)     Status: None (Preliminary result)   Collection Time: 04/25/18  6:57 PM  Result Value Ref Range Status   Specimen Description   Final    BLOOD LEFT ANTECUBITAL BOTTLES DRAWN AEROBIC AND ANAEROBIC   Special Requests Blood Culture adequate volume  Final   Culture   Final    NO GROWTH < 12 HOURS Performed at [facility], [address], [place], [place] [address]    Report Status PENDING  Incomplete  Culture, blood (Routine x 2)     Status: None (Preliminary result)   Collection Time: 04/25/18  7:23 PM  Result Value Ref Range Status   Specimen Description   Final    BLOOD LEFT ANTECUBITAL BOTTLES DRAWN AEROBIC AND ANAEROBIC   Special Requests Blood Culture adequate volume  Final   Culture   Final    NO GROWTH < 12 HOURS Performed at [facility], [address], [place], [place] [address]    Report Status PENDING  Incomplete  MRSA PCR Screening     Status: Abnormal   Collection Time: 04/25/18 11:34 PM  Result Value Ref Range Status   MRSA by PCR POSITIVE (A) NEGATIVE Final    Comment:        The GeneXpert MRSA Assay (FDA approved for NASAL specimens only), is one component of a comprehensive MRSA colonization surveillance program. It is not intended to diagnose MRSA infection nor to guide or monitor treatment for MRSA infections. RESULT CALLED TO, READ BACK BY AND VERIFIED WITH: MARTIN @ 0902 ON [id number] BY HENDERSON L Performed at [facility], [address] [place], [place] [address]      Labs: Basic Metabolic Panel: Recent Labs  Lab 04/22/18 0910 04/25/18 1857 04/26/18 0402  NA 141 136 139  K 3.4* 3.7 3.4*  CL 105 102 109  CO2 _0 GLUCOSE 153* 126*  110*  BUN _0 CREATININE 1.07* 1.15* 0.92  CALCIUM 8.4* 8.6* 7.8*  MG 0.9*  --   --    Liver Function Tests: Recent Labs  Lab 04/22/18 0910 04/25/18 1857 04/26/18 0402  AST 54* 40 41  ALT _1 ALKPHOS 67 63 52  BILITOT 0.9 0.5 0.8  PROT 7.2 7.0 5.8*  ALBUMIN 4.2 4.1 3.4*   No results for input(s): LIPASE, AMYLASE in the last 168 hours. No results for input(s): AMMONIA in the last 168 hours. CBC: Recent Labs  Lab 04/22/18 0910 04/25/18 1857 04/26/18 0402  WBC 4.4 3.8* 3.1*  NEUTROABS 1.8 1.5*  --   HGB 12.1 10.7* 9.6*  HCT 36.7 32.3* 29.5*  MCV 92.0 95.8 97.4  PLT 218 176 152   Cardiac Enzymes: No results for input(s): CKTOTAL, CKMB, CKMBINDEX, TROPONINI in the last 168 hours. BNP: BNP (last 3 results) No results for input(s): BNP in the last 8760 hours.  ProBNP (last 3 results) No results for input(s): PROBNP in the last 8760 hours.  CBG: Recent Labs  Lab 04/26/18 0821  GLUCAP 111*       Signed:  Lelon Frohlich  [facility] Pager: [phone] 04/26/2018, 9:51 AM

## 2018-04-27 LAB — URINE CULTURE: Culture: <10000 — AB

## 2018-04-28 ENCOUNTER — Ambulatory Visit (HOSPITAL_COMMUNITY): Payer: Self-pay

## 2018-04-29 ENCOUNTER — Other Ambulatory Visit (HOSPITAL_COMMUNITY): Payer: Self-pay | Admitting: Nurse Practitioner

## 2018-04-29 DIAGNOSIS — C9000 Multiple myeloma not having achieved remission: Secondary | ICD-10-CM

## 2018-04-29 LAB — HIV ANTIBODY (ROUTINE TESTING W REFLEX): HIV Screen 4th Generation wRfx: NONREACTIVE

## 2018-04-30 LAB — CULTURE, BLOOD (ROUTINE X 2)
Culture: NO GROWTH
Culture: NO GROWTH
SPECIAL REQUESTS: ADEQUATE
SPECIAL REQUESTS: ADEQUATE

## 2018-05-01 ENCOUNTER — Encounter (HOSPITAL_COMMUNITY): Payer: Self-pay | Admitting: Hematology

## 2018-05-01 ENCOUNTER — Ambulatory Visit (HOSPITAL_COMMUNITY): Payer: Self-pay

## 2018-05-01 ENCOUNTER — Other Ambulatory Visit: Payer: Self-pay

## 2018-05-01 ENCOUNTER — Inpatient Hospital Stay (HOSPITAL_BASED_OUTPATIENT_CLINIC_OR_DEPARTMENT_OTHER): Payer: BLUE CROSS/BLUE SHIELD | Admitting: Hematology

## 2018-05-01 ENCOUNTER — Ambulatory Visit (HOSPITAL_COMMUNITY): Payer: Self-pay | Admitting: Hematology

## 2018-05-01 ENCOUNTER — Inpatient Hospital Stay (HOSPITAL_COMMUNITY): Payer: BLUE CROSS/BLUE SHIELD

## 2018-05-01 VITALS — BP 132/99 | HR 98 | Resp 18 | Wt 259.0 lb

## 2018-05-01 DIAGNOSIS — T451X5A Adverse effect of antineoplastic and immunosuppressive drugs, initial encounter: Secondary | ICD-10-CM

## 2018-05-01 DIAGNOSIS — Z87891 Personal history of nicotine dependence: Secondary | ICD-10-CM

## 2018-05-01 DIAGNOSIS — G620 Drug-induced polyneuropathy: Secondary | ICD-10-CM | POA: Diagnosis not present

## 2018-05-01 DIAGNOSIS — Z9484 Stem cells transplant status: Secondary | ICD-10-CM | POA: Diagnosis not present

## 2018-05-01 DIAGNOSIS — E119 Type 2 diabetes mellitus without complications: Secondary | ICD-10-CM | POA: Diagnosis not present

## 2018-05-01 DIAGNOSIS — C9000 Multiple myeloma not having achieved remission: Secondary | ICD-10-CM | POA: Diagnosis not present

## 2018-05-01 DIAGNOSIS — M545 Low back pain: Secondary | ICD-10-CM | POA: Diagnosis not present

## 2018-05-01 LAB — COMPREHENSIVE METABOLIC PANEL
ALBUMIN: 4.1 g/dL (ref 3.5–5.0)
ALT: 32 U/L (ref 0–44)
ANION GAP: 10 (ref 5–15)
AST: 54 U/L — ABNORMAL HIGH (ref 15–41)
Alkaline Phosphatase: 57 U/L (ref 38–126)
BUN: 19 mg/dL (ref 6–20)
CHLORIDE: 107 mmol/L (ref 98–111)
CO2: 23 mmol/L (ref 22–32)
Calcium: 9.6 mg/dL (ref 8.9–10.3)
Creatinine, Ser: 1.13 mg/dL — ABNORMAL HIGH (ref 0.44–1.00)
GFR calc Af Amer: >60 mL/min (ref >60)
GFR calc non Af Amer: 54 mL/min — ABNORMAL LOW (ref >60)
GLUCOSE: 123 mg/dL — AB (ref 70–99)
Potassium: 3.6 mmol/L (ref 3.5–5.1)
SODIUM: 140 mmol/L (ref 135–145)
Total Bilirubin: 0.8 mg/dL (ref 0.3–1.2)
Total Protein: 7.1 g/dL (ref 6.5–8.1)

## 2018-05-01 LAB — CBC WITH DIFFERENTIAL/PLATELET
ABS IMMATURE GRANULOCYTES: 0.05 10*3/uL (ref 0.00–0.07)
BASOS ABS: 0 10*3/uL (ref 0.0–0.1)
Basophils Relative: 0 %
EOS ABS: 0.1 10*3/uL (ref 0.0–0.5)
Eosinophils Relative: 1 %
HEMATOCRIT: 33.4 % — AB (ref 36.0–46.0)
HEMOGLOBIN: 11.1 g/dL — AB (ref 12.0–15.0)
IMMATURE GRANULOCYTES: 1 %
LYMPHS ABS: 1.9 10*3/uL (ref 0.7–4.0)
LYMPHS PCT: 31 %
MCH: 30.8 pg (ref 26.0–34.0)
MCHC: 33.2 g/dL (ref 30.0–36.0)
MCV: 92.8 fL (ref 80.0–100.0)
MONOS PCT: 8 %
Monocytes Absolute: 0.5 10*3/uL (ref 0.1–1.0)
NEUTROS ABS: 3.5 10*3/uL (ref 1.7–7.7)
NEUTROS PCT: 59 %
Platelets: 233 10*3/uL (ref 150–400)
RBC: 3.6 MIL/uL — ABNORMAL LOW (ref 3.87–5.11)
RDW: 15.7 % — ABNORMAL HIGH (ref 11.5–15.5)
WBC: 6 10*3/uL (ref 4.0–10.5)
nRBC: 0 % (ref 0.0–0.2)

## 2018-05-01 LAB — MAGNESIUM: Magnesium: 1.1 mg/dL — ABNORMAL LOW (ref 1.7–2.4)

## 2018-05-01 MED ORDER — PREGABALIN 200 MG PO CAPS: 200.0000 mg | ORAL_CAPSULE | Freq: Two times a day (BID) | ORAL | 3 refills | Status: DC

## 2018-05-01 NOTE — Assessment & Plan Note (Signed)
1.  IgG lambda multiple myeloma, stage III, del 17p: - Presentation with lower back pain since March, presented to the emergency room on 10/25/2017 with right groin pain, CT scan showed multiple lytic lesions, multiple lytic lesions on skeletal survey - SPEP shows 1.5 g/dL of monoclonal protein, beta-2 microglobulin of 4.8, free lambda light chains of 2869, kappa by lambda ratio of less than 0, elevated LDH. 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  FISH panel shows -14, -12, 13 q.-/-13 and 17p-.  Bone marrow biopsy shows 90% plasma cells. -Cycle 1 of RVD started on 11/01/2017. -She had a consultation with Dr. Laverta [place] at [facility] on 12/19/2017.  Upfront bone marrow transplant was recommended after 4 cycles because of her 17 P deletion. - Cycle 4 Velcade was started on 01/03/2018.  Revlimid was started on 01/09/2018.  She has tolerated them very well.  No signs of peripheral neuropathy or other side effects from Revlimid noted. -She completed cycle 4 on 01/14/2018.  She received 1 more additional dose of Velcade on 01/24/2018.  Labs done on 01/20/2018 showed M spike to be negative. - She was evaluated by Dr. Laverta [place] at [facility].  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  She was discharged home on 03/07/2018.  Her energy levels are improving. - I have reviewed blood work today which shows normalized CBC.  Her creatinine went up slightly as she was started back on Bumex. - She was restarted back on Zometa on 04/22/2018. -She was admitted to the hospital for 1 night on 04/25/2018 with fever and cough.  She was diagnosed with bronchitis.  There was no evidence of pneumonia on x-ray.  She did receive 5 days of Levaquin at home. -She is feeling much better today in terms of energy.  She will  come back in 2 weeks for follow-up.  2.  Low back pain: Her low back pain completely improved after starting myeloma therapy.  She is not taking any pain medicine.  3.  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  She is currently taking Lyrica 100 mg twice daily.  I will increase it to 200 mg twice daily.   4.  ID prophylaxis: She is currently receiving acyclovir 800 mg twice daily.  Her aspirin has been on hold since transplant.

## 2018-05-01 NOTE — Progress Notes (Signed)
Joanna Reid, [place] [id number]   CLINIC:  Medical Oncology/Hematology  PCP:  Mikey Kirschner, MD [address] [place] [address] [phone]   REASON FOR VISIT: Follow-up for multiple myeloma  CURRENT THERAPY: S/P transplant restarting zometa   BRIEF ONCOLOGIC HISTORY:    Multiple myeloma ([place])   10/29/2017 Initial Diagnosis    Multiple myeloma ([place])    11/01/2017 -  Chemotherapy    The patient had bortezomib SQ (VELCADE) chemo injection 3 mg, 1.3 mg/m2 = 3 mg, Subcutaneous,  Once, 5 of 5 cycles Administration: 3 mg (11/01/2017), 3 mg (11/08/2017), 3 mg (11/05/2017), 3 mg (11/22/2017), 3 mg (11/12/2017), 3 mg (11/29/2017), 3 mg (11/26/2017), 3 mg (12/03/2017), 3 mg (12/13/2017), 3 mg (12/20/2017), 3 mg (12/17/2017), 3 mg (12/24/2017), 3 mg (01/03/2018), 3 mg (01/10/2018), 3 mg (01/07/2018), 3 mg (01/14/2018), 3 mg (01/24/2018)  for chemotherapy treatment.       CANCER STAGING: Cancer Staging Multiple myeloma ([place]) Staging form: Plasma Cell Myeloma and Plasma Cell Disorders, AJCC 8th Edition - Clinical: No stage assigned - Unsigned    INTERVAL HISTORY:  Joanna Reid 52 y.o. female returns for routine follow-up for multiple myeloma.  She was recently treated for bronchitis for 1 night in the hospital with Levaquin.  She denies any fevers or cough since discharge.  She completed her last pill yesterday.  Her numbness has been stable.  She is taking Lyrica 100 mg twice a day.  Energy levels are 100%.  Appetite is 100%.   REVIEW OF SYSTEMS:  Review of Systems  Constitutional: Negative for fatigue.  Gastrointestinal: Negative for nausea and vomiting.  Neurological: Positive for numbness. Negative for extremity weakness.  All other systems reviewed and are negative.    PAST MEDICAL/SURGICAL HISTORY:  Past Medical History:  Diagnosis Date  . Acid reflux   . Allergic rhinitis   . Diabetes mellitus    type 2  . Gout   . Gout   . HBP (high  blood pressure)   . History of kidney stones   . Migraines    Past Surgical History:  Procedure Laterality Date  . CESAREAN SECTION    . EXTRACORPOREAL SHOCK WAVE LITHOTRIPSY Left 10/10/2017   Procedure: LEFT EXTRACORPOREAL SHOCK WAVE LITHOTRIPSY (ESWL);  Surgeon: Bjorn Loser, MD;  Location: [facility];  Service: Urology;  Laterality: Left;  . EYE SURGERY    . HEMORRHOID SURGERY N/A 11/19/2012   Procedure: HEMORRHOIDECTOMY;  Surgeon: Jamesetta So, MD;  Location: [facility];  Service: General;  Laterality: N/A;  . kidney stones  1998  . LAPAROSCOPIC UNILATERAL SALPINGO OOPHERECTOMY  05/14/2012   Procedure: LAPAROSCOPIC UNILATERAL SALPINGO OOPHORECTOMY;  Surgeon: [name], MD;  Location: [facility];  Service: Gynecology;  Laterality: Right;  laparoscopic right salpingo-oophorectomy  . PARTIAL HYSTERECTOMY    . TONSILECTOMY, ADENOIDECTOMY, BILATERAL MYRINGOTOMY AND TUBES    . VESICOVAGINAL FISTULA CLOSURE W/ TAH       SOCIAL HISTORY:  Social History   Socioeconomic History  . Marital status: Married    Spouse name: Not on file  . Number of children: Not on file  . Years of education: 65  . Highest education level: Not on file  Occupational History    Employer: [employer]  . Financial resource strain: Not on file  . Food insecurity:    Worry: Never true    Inability: Never true  . Transportation needs:    Medical: Not  on file    Non-medical: Not on file  Tobacco Use  . Smoking status: Former Smoker    Last attempt to quit: 02/27/1999    Years since quitting: 19.1  . Smokeless tobacco: Never Used  . Tobacco comment: socially   Substance and Sexual Activity  . Alcohol use: No    Alcohol/week: 0.0 standard drinks  . Drug use: No  . Sexual activity: Yes  Lifestyle  . Physical activity:    Days per week: Not on file    Minutes per session: Not on file  . Stress: Not on file  Relationships  . Social connections:    Talks on phone: Not on file    Gets together:  Not on file    Attends religious service: Not on file    Active member of club or organization: Not on file    Attends meetings of clubs or organizations: Not on file    Relationship status: Not on file  . Intimate partner violence:    Fear of current or ex partner: Not on file    Emotionally abused: Not on file    Physically abused: Not on file    Forced sexual activity: Not on file  Other Topics Concern  . Not on file  Social History Narrative  . Not on file    FAMILY HISTORY:  Family History  Problem Relation Age of Onset  . Arthritis Unknown   . Cancer Unknown   . Diabetes Unknown   . Hypertension Mother   . Dementia Mother   . Diabetes Father   . ALS Father   . Diabetes Brother   . Hypertension Brother   . Cancer Paternal Aunt   . COPD Maternal Grandmother   . Cancer Maternal Grandfather   . Anesthesia problems Paternal Grandfather     CURRENT MEDICATIONS:  Outpatient Encounter Medications as of 05/01/2018  Medication Sig Note  . acyclovir (ZOVIRAX) 800 MG tablet    . albuterol (PROVENTIL HFA;VENTOLIN HFA) 108 (90 Base) MCG/ACT inhaler Inhale 1-2 puffs into the lungs daily as needed.   Marland Kitchen allopurinol (ZYLOPRIM) 300 MG tablet TAKE 1 TABLET BY MOUTH  DAILY FOR GOUT   . aspirin EC 81 MG tablet Take 81 mg by mouth daily.   . bumetanide (BUMEX) 1 MG tablet Take 1 tablet (1 mg total) by mouth daily.   . Calcium Carb-Cholecalciferol (CALCIUM 600+D3 PO) Take by mouth. 11/08/2017: Calcium 6107m with Vitamin D 1,000 units BID  . carvedilol (COREG) 6.25 MG tablet Take by mouth.   . dexamethasone (DECADRON) 4 MG tablet Take 10 tablets (40 mg total) by mouth once a week.   . diazepam (VALIUM) 5 MG tablet TAKE 1 TABLET BY MOUTH AT BEDTIME AS NEEDED FOR ANXIETY OR INSOMNIA   . Docosanol 10 % CREA Apply topically.   . docusate sodium (COLACE) 100 MG capsule Take by mouth.   . fluticasone (FLONASE) 50 MCG/ACT nasal spray Place 2 sprays into the nose 2 (two) times daily.   .Marland Kitchenglucose  blood (PRECISION QID TEST) test strip Use 1 each (1 strip total) 4 (four) times daily Use as instructed. OneTouch Verio   . HUMALOG KWIKPEN 100 UNIT/ML KiwkPen INJECT THREE TIMES DAILY BEFORE MEALS PER CORRECTION SCALE. MAX DOSE PER DAY IS 35 UNITS   . HYDROcodone-acetaminophen (NORCO/VICODIN) 5-325 MG tablet Take 1 tablet by mouth every 6 (six) hours as needed for moderate pain.   .Marland Kitcheninsulin glargine (LANTUS) 100 UNIT/ML injection Inject 0.15 mLs (15 Units  total) into the skin at bedtime.   . Lactulose 20 GM/30ML SOLN Take 6m by mouth every 3-4 hours until bowel movement. Then take 341mby mouth daily.   . Lancets Misc. (UNISTIK 2 NORMAL) MISC Use 1 each 4 (four) times daily Use as instructed.   . Marland Kitchenenalidomide (REVLIMID) 25 MG capsule Take 1 capsule by mouth daily x 14 days in a row followed by a 7 day rest period. Then resume regimen.   . Marland Kitchenevofloxacin (LEVAQUIN) 750 MG tablet Take 1 tablet (750 mg total) by mouth daily for 5 days.   . Marland Kitchenosartan (COZAAR) 100 MG tablet Take 1 tablet (100 mg total) by mouth daily.   . meloxicam (MOBIC) 15 MG tablet TAKE 1 TABLET BY MOUTH ONCE DAILY   . metFORMIN (GLUCOPHAGE) 500 MG tablet TAKE 1 TABLET BY MOUTH ONCE DAILY WITH BREAKFAST   . omeprazole (PRILOSEC) 20 MG capsule Take 1 capsule (20 mg total) by mouth daily.   . ondansetron (ZOFRAN) 8 MG tablet Take 1 tablet (8 mg total) by mouth every 8 (eight) hours as needed.   . Glory RosebushERIO test strip USE 1 STRIP TO CHECK GLUCOSE 4 TIMES DAILY AS DIRECTED   . polyethylene glycol powder (GLYCOLAX/MIRALAX) powder Take 1 Container by mouth daily.   . potassium chloride SA (K-DUR,KLOR-CON) 20 MEQ tablet    . prochlorperazine (COMPAZINE) 10 MG tablet Take 1 tablet (10 mg total) by mouth every 6 (six) hours as needed for nausea or vomiting.   . verapamil (CALAN-SR) 240 MG CR tablet Take 1 tablet (240 mg total) by mouth at bedtime.   . Vitamin D, Ergocalciferol, (DRISDOL) 50000 units CAPS capsule    . pregabalin  (LYRICA) 200 MG capsule Take 1 capsule (200 mg total) by mouth 2 (two) times daily.   . [DISCONTINUED] acyclovir (ZOVIRAX) 400 MG tablet Take 1 tablet (400 mg total) by mouth 2 (two) times daily.    No facility-administered encounter medications on file as of 05/01/2018.     ALLERGIES:  No Known Allergies   PHYSICAL EXAM:  ECOG Performance status: 1  Vitals:   05/01/18 1416  BP: (!) 132/99  Pulse: 98  Resp: 18  SpO2: 98%   Filed Weights   05/01/18 1416  Weight: 259 lb (117.5 kg)    Physical Exam  Constitutional: She is oriented to person, place, and time. She appears well-developed and well-nourished.  Cardiovascular: Normal rate, regular rhythm and normal heart sounds.  Pulmonary/Chest: Effort normal and breath sounds normal.  Musculoskeletal: Normal range of motion.  Neurological: She is alert and oriented to person, place, and time.  Skin: Skin is warm and dry.  Psychiatric: She has a normal mood and affect. Her behavior is normal. Judgment and thought content normal.     LABORATORY DATA:  I have reviewed the labs as listed.  CBC    Component Value Date/Time   WBC 6.0 05/01/2018 1408   RBC 3.60 (L) 05/01/2018 1408   HGB 11.1 (L) 05/01/2018 1408   HGB 14.2 08/02/2015 1113   HCT 33.4 (L) 05/01/2018 1408   HCT 41.1 08/02/2015 1113   PLT 233 05/01/2018 1408   PLT 371 08/02/2015 1113   MCV 92.8 05/01/2018 1408   MCV 84 08/02/2015 1113   MCH 30.8 05/01/2018 1408   MCHC 33.2 05/01/2018 1408   RDW 15.7 (H) 05/01/2018 1408   RDW 15.1 08/02/2015 1113   LYMPHSABS 1.9 05/01/2018 1408   LYMPHSABS 2.9 08/02/2015 1113   MONOABS 0.5 05/01/2018 1408  EOSABS 0.1 05/01/2018 1408   EOSABS 0.2 08/02/2015 1113   BASOSABS 0.0 05/01/2018 1408   BASOSABS 0.1 08/02/2015 1113   CMP Latest Ref Rng & Units 05/01/2018 04/26/2018 04/25/2018  Glucose 70 - 99 mg/dL 123(H) 110(H) 126(H)  BUN 6 - 20 mg/dL _0 Creatinine 0.44 - 1.00 mg/dL 1.13(H) 0.92 1.15(H)  Sodium 135 -  145 mmol/L 140 139 136  Potassium 3.5 - 5.1 mmol/L 3.6 3.4(L) 3.7  Chloride 98 - 111 mmol/L 107 109 102  CO2 22 - 32 mmol/L _1 Calcium 8.9 - 10.3 mg/dL 9.6 7.8(L) 8.6(L)  Total Protein 6.5 - 8.1 g/dL 7.1 5.8(L) 7.0  Total Bilirubin 0.3 - 1.2 mg/dL 0.8 0.8 0.5  Alkaline Phos 38 - 126 U/L 57 52 63  AST 15 - 41 U/L 54(H) 41 40  ALT 0 - 44 U/L 32 24 30         ASSESSMENT & PLAN:   Multiple myeloma ([facility]) 1.  IgG lambda multiple myeloma, stage III, del 17p: - Presentation with lower back pain since March, presented to the emergency room on 10/25/2017 with right groin pain, CT scan showed multiple lytic lesions, multiple lytic lesions on skeletal survey - SPEP shows 1.5 g/dL of monoclonal protein, beta-2 microglobulin of 4.8, free lambda light chains of 2869, kappa by lambda ratio of less than 0, elevated LDH. 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  FISH panel shows -14, -12, 13 q.-/-13 and 17p-.  Bone marrow biopsy shows 90% plasma cells. -Cycle 1 of RVD started on 11/01/2017. -She had a consultation with Dr. Laverta Baltimore at [facility] on 12/19/2017.  Upfront bone marrow transplant was recommended after 4 cycles because of her 17 P deletion. - Cycle 4 Velcade was started on 01/03/2018.  Revlimid was started on 01/09/2018.  She has tolerated them very well.  No signs of peripheral neuropathy or other side effects from Revlimid noted. -She completed cycle 4 on 01/14/2018.  She received 1 more additional dose of Velcade on 01/24/2018.  Labs done on 01/20/2018 showed M spike to be negative. - She was evaluated by Dr. Laverta Baltimore at [facility].  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  She was discharged home on 03/07/2018.  Her energy levels are improving. - I have reviewed blood  work today which shows normalized CBC.  Her creatinine went up slightly as she was started back on Bumex. - She was restarted back on Zometa on 04/22/2018. -She was admitted to the hospital for 1 night on 04/25/2018 with fever and cough.  She was diagnosed with bronchitis.  There was no evidence of pneumonia on x-ray.  She did receive 5 days of Levaquin at home. -She is feeling much better today in terms of energy.  She will come back in 2 weeks for follow-up.  2.  Low back pain: Her low back pain completely improved after starting myeloma therapy.  She is not taking any pain medicine.  3.  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  She is currently taking Lyrica 100 mg twice daily.  I will increase it to 200 mg twice daily.   4.  ID prophylaxis: She is currently receiving acyclovir 800 mg twice daily.  Her aspirin  has been on hold since transplant.      Orders placed this encounter:  No orders of the defined types were placed in this encounter.     Derek Jack, MD [place] [phone]

## 2018-05-14 DIAGNOSIS — M1712 Unilateral primary osteoarthritis, left knee: Secondary | ICD-10-CM | POA: Diagnosis not present

## 2018-05-14 DIAGNOSIS — C9001 Multiple myeloma in remission: Secondary | ICD-10-CM | POA: Diagnosis not present

## 2018-05-14 DIAGNOSIS — M545 Low back pain: Secondary | ICD-10-CM | POA: Diagnosis not present

## 2018-05-14 DIAGNOSIS — M25561 Pain in right knee: Secondary | ICD-10-CM | POA: Diagnosis not present

## 2018-05-14 DIAGNOSIS — E1165 Type 2 diabetes mellitus with hyperglycemia: Secondary | ICD-10-CM | POA: Diagnosis not present

## 2018-05-14 DIAGNOSIS — C9000 Multiple myeloma not having achieved remission: Secondary | ICD-10-CM | POA: Diagnosis not present

## 2018-05-14 DIAGNOSIS — E785 Hyperlipidemia, unspecified: Secondary | ICD-10-CM | POA: Diagnosis not present

## 2018-05-14 DIAGNOSIS — E1142 Type 2 diabetes mellitus with diabetic polyneuropathy: Secondary | ICD-10-CM | POA: Diagnosis not present

## 2018-05-14 DIAGNOSIS — Z6841 Body Mass Index (BMI) 40.0 and over, adult: Secondary | ICD-10-CM | POA: Diagnosis not present

## 2018-05-14 DIAGNOSIS — Z9481 Bone marrow transplant status: Secondary | ICD-10-CM | POA: Diagnosis not present

## 2018-05-14 DIAGNOSIS — Z8739 Personal history of other diseases of the musculoskeletal system and connective tissue: Secondary | ICD-10-CM | POA: Diagnosis not present

## 2018-05-14 DIAGNOSIS — T380X5D Adverse effect of glucocorticoids and synthetic analogues, subsequent encounter: Secondary | ICD-10-CM | POA: Diagnosis not present

## 2018-05-14 DIAGNOSIS — Z9484 Stem cells transplant status: Secondary | ICD-10-CM | POA: Diagnosis not present

## 2018-05-14 DIAGNOSIS — E782 Mixed hyperlipidemia: Secondary | ICD-10-CM | POA: Diagnosis not present

## 2018-05-14 DIAGNOSIS — I10 Essential (primary) hypertension: Secondary | ICD-10-CM | POA: Diagnosis not present

## 2018-05-14 DIAGNOSIS — Z794 Long term (current) use of insulin: Secondary | ICD-10-CM | POA: Diagnosis not present

## 2018-05-14 DIAGNOSIS — M1711 Unilateral primary osteoarthritis, right knee: Secondary | ICD-10-CM | POA: Diagnosis not present

## 2018-05-18 DIAGNOSIS — N200 Calculus of kidney: Secondary | ICD-10-CM | POA: Diagnosis not present

## 2018-05-19 DIAGNOSIS — N200 Calculus of kidney: Secondary | ICD-10-CM | POA: Diagnosis not present

## 2018-05-20 ENCOUNTER — Other Ambulatory Visit: Payer: Self-pay

## 2018-05-20 ENCOUNTER — Inpatient Hospital Stay (HOSPITAL_COMMUNITY): Payer: BLUE CROSS/BLUE SHIELD | Attending: Hematology

## 2018-05-20 ENCOUNTER — Telehealth (HOSPITAL_COMMUNITY): Payer: Self-pay | Admitting: Pharmacist

## 2018-05-20 ENCOUNTER — Encounter (HOSPITAL_COMMUNITY): Payer: Self-pay | Admitting: Hematology

## 2018-05-20 ENCOUNTER — Inpatient Hospital Stay (HOSPITAL_COMMUNITY): Payer: BLUE CROSS/BLUE SHIELD | Admitting: Hematology

## 2018-05-20 ENCOUNTER — Inpatient Hospital Stay (HOSPITAL_COMMUNITY): Payer: BLUE CROSS/BLUE SHIELD

## 2018-05-20 ENCOUNTER — Telehealth (HOSPITAL_COMMUNITY): Payer: Self-pay | Admitting: Pharmacy Technician

## 2018-05-20 VITALS — BP 135/94 | HR 92 | Temp 98.0°F | Resp 18

## 2018-05-20 VITALS — BP 145/97 | HR 99 | Temp 98.2°F | Resp 18 | Wt 270.6 lb

## 2018-05-20 DIAGNOSIS — E119 Type 2 diabetes mellitus without complications: Secondary | ICD-10-CM

## 2018-05-20 DIAGNOSIS — M1711 Unilateral primary osteoarthritis, right knee: Secondary | ICD-10-CM | POA: Diagnosis not present

## 2018-05-20 DIAGNOSIS — Z87891 Personal history of nicotine dependence: Secondary | ICD-10-CM

## 2018-05-20 DIAGNOSIS — M545 Low back pain: Secondary | ICD-10-CM | POA: Diagnosis not present

## 2018-05-20 DIAGNOSIS — Z9484 Stem cells transplant status: Secondary | ICD-10-CM

## 2018-05-20 DIAGNOSIS — G629 Polyneuropathy, unspecified: Secondary | ICD-10-CM

## 2018-05-20 DIAGNOSIS — C9000 Multiple myeloma not having achieved remission: Secondary | ICD-10-CM

## 2018-05-20 DIAGNOSIS — Z794 Long term (current) use of insulin: Secondary | ICD-10-CM

## 2018-05-20 DIAGNOSIS — M1712 Unilateral primary osteoarthritis, left knee: Secondary | ICD-10-CM | POA: Diagnosis not present

## 2018-05-20 LAB — COMPREHENSIVE METABOLIC PANEL
ALBUMIN: 4 g/dL (ref 3.5–5.0)
ALT: 21 U/L (ref 0–44)
AST: 41 U/L (ref 15–41)
Alkaline Phosphatase: 75 U/L (ref 38–126)
Anion gap: 9 (ref 5–15)
BUN: 19 mg/dL (ref 6–20)
CHLORIDE: 106 mmol/L (ref 98–111)
CO2: 25 mmol/L (ref 22–32)
Calcium: 9.3 mg/dL (ref 8.9–10.3)
Creatinine, Ser: 1.07 mg/dL — ABNORMAL HIGH (ref 0.44–1.00)
GFR calc Af Amer: >60 mL/min (ref >60)
GFR calc non Af Amer: 58 mL/min — ABNORMAL LOW (ref >60)
GLUCOSE: 127 mg/dL — AB (ref 70–99)
POTASSIUM: 3.9 mmol/L (ref 3.5–5.1)
Sodium: 140 mmol/L (ref 135–145)
Total Bilirubin: 0.8 mg/dL (ref 0.3–1.2)
Total Protein: 7.1 g/dL (ref 6.5–8.1)

## 2018-05-20 LAB — LACTATE DEHYDROGENASE: LDH: 177 U/L (ref 98–192)

## 2018-05-20 LAB — CBC WITH DIFFERENTIAL/PLATELET
Abs Immature Granulocytes: 0.06 10*3/uL (ref 0.00–0.07)
BASOS ABS: 0 10*3/uL (ref 0.0–0.1)
Basophils Relative: 0 %
Eosinophils Absolute: 0.3 10*3/uL (ref 0.0–0.5)
Eosinophils Relative: 5 %
HEMATOCRIT: 33.3 % — AB (ref 36.0–46.0)
Hemoglobin: 10.7 g/dL — ABNORMAL LOW (ref 12.0–15.0)
IMMATURE GRANULOCYTES: 1 %
LYMPHS ABS: 1.3 10*3/uL (ref 0.7–4.0)
LYMPHS PCT: 21 %
MCH: 30.8 pg (ref 26.0–34.0)
MCHC: 32.1 g/dL (ref 30.0–36.0)
MCV: 96 fL (ref 80.0–100.0)
Monocytes Absolute: 0.6 10*3/uL (ref 0.1–1.0)
Monocytes Relative: 10 %
NEUTROS PCT: 63 %
NRBC: 0 % (ref 0.0–0.2)
Neutro Abs: 4 10*3/uL (ref 1.7–7.7)
Platelets: 233 10*3/uL (ref 150–400)
RBC: 3.47 MIL/uL — ABNORMAL LOW (ref 3.87–5.11)
RDW: 15 % (ref 11.5–15.5)
WBC: 6.3 10*3/uL (ref 4.0–10.5)

## 2018-05-20 LAB — MAGNESIUM: Magnesium: 1.3 mg/dL — ABNORMAL LOW (ref 1.7–2.4)

## 2018-05-20 MED ORDER — LENALIDOMIDE 10 MG PO CAPS: 10.0000 mg | ORAL_CAPSULE | Freq: Every day | ORAL | 0 refills | Status: DC

## 2018-05-20 MED ORDER — ZOLEDRONIC ACID 4 MG/100ML IV SOLN
4.0000 mg | Freq: Once | INTRAVENOUS | Status: AC
  Administered 2018-05-20: 4 mg via INTRAVENOUS
  Filled 2018-05-20: qty 100

## 2018-05-20 MED ORDER — SODIUM CHLORIDE 0.9 % IV SOLN
INTRAVENOUS | Status: DC
  Administered 2018-05-20: 11:00:00 via INTRAVENOUS

## 2018-05-20 NOTE — Telephone Encounter (Signed)
Oral Oncology Pharmacist Encounter  Received new prescription for Revlimid (lenalidomide) for multiple myeloma maintenance therapy following transplant in conjunction with velcade, planned duration until disease progression or unacceptable drug toxicity.  CBC/CMP from 05/20/18 assessed, no relevant lab abnormalities. Prescription dose and frequency assessed.   Current medication list in Epic reviewed, no DDIs with Revlimid identified.  Prescription has been e-scribed to [place] for benefits analysis and approval.  Oral Oncology Clinic will continue to follow for copayment issues, initial counseling and start date.  Darl Pikes, PharmD, BCPS, [facility] Hematology/Oncology Clinical Pharmacist [facility] [phone]  05/20/2018 2:51 PM

## 2018-05-20 NOTE — Progress Notes (Signed)
DISCONTINUE ON PATHWAY REGIMEN - Multiple Myeloma and Other Plasma Cell Dyscrasias  No Medical Intervention - Off Treatment.  REASON: Other Reason PRIOR TREATMENT: [id number]: Referral to Transplant Service  START OFF PATHWAY REGIMEN - Multiple Myeloma and Other Plasma Cell Dyscrasias   [id number]:Bortezomib Maintenance (SubQ) q2weeks:   A cycle is every 2 weeks:     Bortezomib   **Always confirm dose/schedule in your pharmacy ordering system**  Patient Characteristics: Newly Diagnosed, Transplant Eligible, High Risk R-ISS Staging: II Disease Classification: Newly Diagnosed Is Patient Eligible for Transplant<= Transplant Eligible Risk Status: High Risk Intent of Therapy: Non-Curative / Palliative Intent, Discussed with Patient

## 2018-05-20 NOTE — Assessment & Plan Note (Signed)
1.  IgG lambda multiple myeloma, stage III, del 17p: - Presentation with lower back pain since March, presented to the emergency room on 10/25/2017 with right groin pain, CT scan showed multiple lytic lesions, multiple lytic lesions on skeletal survey - SPEP shows 1.5 g/dL of monoclonal protein, beta-2 microglobulin of 4.8, free lambda light chains of 2869, kappa by lambda ratio of less than 0, elevated LDH. 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  FISH panel shows -14, -12, 13 q.-/-13 and 17p-.  Bone marrow biopsy shows 90% plasma cells. -4 cycles of RVD from 11/01/2017 through 01/14/2018.  Labs done on 01/20/2018 showed M spike to be negative. - She was evaluated by Dr. Laverta Baltimore at [facility].  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  She was discharged home on 03/07/2018.  Her energy levels are improving. - I have reviewed blood work today which shows normalized CBC.  Her creatinine went up slightly as she was started back on Bumex. - She was restarted back on Zometa on 04/22/2018. -Most recent blood work from [facility] shows negative SPEP, immunofixation and normal free light chain and ratio. - She was started back on Zometa on 04/22/2018. - Today is day 13 of transplant.  We discussed maintenance therapy with Revlimid 10 mg 3 weeks on 1 week off and Velcade 1.3 mg/m subcu every 2 weeks.  We discussed the side effects also in detail.  We plan to start it in a couple of weeks. -She will continue to have her vaccines at [facility].  2.  Low back pain: Her low back pain completely improved after starting myeloma therapy.  She is not taking any pain medicine.  3.  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 -She is taking  Lyrica 200 mg twice daily which is controlling her symptoms.  4.  ID prophylaxis:She will continue acyclovir twice daily.  5.  Hypomagnesemia: -Her magnesium today is 1.3.  She is currently taking magnesium 1 tablet twice daily.  I have told her to increase it to 2 tablets twice daily.  6.  Diabetes: - She is continuing metformin thousand milligrams twice daily. -She was recently started on Trulicity and Lantus 7 units at bedtime.

## 2018-05-20 NOTE — Patient Instructions (Signed)
[facility] _______________________________________________________________  Thank you for choosing [facility] to provide your oncology and hematology care.  To afford each patient quality time with our providers, please arrive at least 15 minutes before your scheduled appointment.  You need to re-schedule your appointment if you arrive 10 or more minutes late.  We strive to give you quality time with our providers, and arriving late affects you and other patients whose appointments are after yours.  Also, if you no show three or more times for appointments you may be dismissed from the clinic.  Again, thank you for choosing  [facility]. Our hope is that these requests will allow you access to exceptional care and in a timely manner. _______________________________________________________________  If you have questions after your visit, please contact our office at [phone] between the hours of 8:30 a.m. and 5:00 p.m. Voicemails left after 4:30 p.m. will not be returned until the following business day. _______________________________________________________________  For prescription refill requests, have your pharmacy contact our office. _______________________________________________________________  Recommendations made by the consultant and any test results will be sent to your referring physician. _______________________________________________________________ 

## 2018-05-20 NOTE — Progress Notes (Signed)
[place], [place] [address]   CLINIC:  Medical Oncology/Hematology  PCP:  Mikey Kirschner, MD [address] [place] [address] [phone]   REASON FOR VISIT: Follow-up for multiple myeloma  CURRENT THERAPY: S/P transplant restarting zometa   BRIEF ONCOLOGIC HISTORY:    Multiple myeloma ([place])   10/29/2017 Initial Diagnosis    Multiple myeloma ([place])    11/01/2017 - 01/24/2018 Chemotherapy    The patient had bortezomib SQ (VELCADE) chemo injection 3 mg, 1.3 mg/m2 = 3 mg, Subcutaneous,  Once, 5 of 5 cycles Administration: 3 mg (11/01/2017), 3 mg (11/08/2017), 3 mg (11/05/2017), 3 mg (11/22/2017), 3 mg (11/12/2017), 3 mg (11/29/2017), 3 mg (11/26/2017), 3 mg (12/03/2017), 3 mg (12/13/2017), 3 mg (12/20/2017), 3 mg (12/17/2017), 3 mg (12/24/2017), 3 mg (01/03/2018), 3 mg (01/10/2018), 3 mg (01/07/2018), 3 mg (01/14/2018), 3 mg (01/24/2018)  for chemotherapy treatment.     06/02/2018 -  Chemotherapy    The patient had bortezomib SQ (VELCADE) chemo injection 3 mg, 1.3 mg/m2 = 3 mg, Subcutaneous,  Once, 0 of 9 cycles  for chemotherapy treatment.       CANCER STAGING: Cancer Staging Multiple myeloma ([place]) Staging form: Plasma Cell Myeloma and Plasma Cell Disorders, AJCC 8th Edition - Clinical: No stage assigned - Unsigned    INTERVAL HISTORY:  Ms. Lynds 52 y.o. female returns for follow-up of myeloma.  She was seen by Dr. Laverta Baltimore at [facility] on 05/14/2018.  She is happy that her myeloma panel was negative.  She is taking magnesium 1 tablet twice daily.  She does not experience any diarrhea from it.  She reports that her neuropathy is better when she increased the dose to Lyrica 200 mg twice daily.  She is continuing metformin thousand twice daily.  She was recently started on Trulicity and Lantus.  Overall she is feeling better in terms of energy levels.  Denies any fevers or night sweats.  REVIEW OF SYSTEMS:  Review of Systems    Cardiovascular: Positive for leg swelling.  Neurological: Positive for numbness.  All other systems reviewed and are negative.    PAST MEDICAL/SURGICAL HISTORY:  Past Medical History:  Diagnosis Date  . Acid reflux   . Allergic rhinitis   . Diabetes mellitus    type 2  . Gout   . Gout   . HBP (high blood pressure)   . History of kidney stones   . Migraines    Past Surgical History:  Procedure Laterality Date  . CESAREAN SECTION    . EXTRACORPOREAL SHOCK WAVE LITHOTRIPSY Left 10/10/2017   Procedure: LEFT EXTRACORPOREAL SHOCK WAVE LITHOTRIPSY (ESWL);  Surgeon: Bjorn Loser, MD;  Location: [facility];  Service: Urology;  Laterality: Left;  . EYE SURGERY    . HEMORRHOID SURGERY N/A 11/19/2012   Procedure: HEMORRHOIDECTOMY;  Surgeon: Jamesetta So, MD;  Location: [facility];  Service: General;  Laterality: N/A;  . kidney stones  1998  . LAPAROSCOPIC UNILATERAL SALPINGO OOPHERECTOMY  05/14/2012   Procedure: LAPAROSCOPIC UNILATERAL SALPINGO OOPHORECTOMY;  Surgeon: [name], MD;  Location: [facility];  Service: Gynecology;  Laterality: Right;  laparoscopic right salpingo-oophorectomy  . PARTIAL HYSTERECTOMY    . TONSILECTOMY, ADENOIDECTOMY, BILATERAL MYRINGOTOMY AND TUBES    . VESICOVAGINAL FISTULA CLOSURE W/ TAH       SOCIAL HISTORY:  Social History   Socioeconomic History  . Marital status: Married    Spouse name: Not on file  . Number  of children: Not on file  . Years of education: 66  . Highest education level: Not on file  Occupational History    Employer: [employer]  . Financial resource strain: Not on file  . Food insecurity:    Worry: Never true    Inability: Never true  . Transportation needs:    Medical: Not on file    Non-medical: Not on file  Tobacco Use  . Smoking status: Former Smoker    Last attempt to quit: 02/27/1999    Years since quitting: 19.2  . Smokeless tobacco: Never Used  . Tobacco comment: socially   Substance and Sexual Activity  .  Alcohol use: No    Alcohol/week: 0.0 standard drinks  . Drug use: No  . Sexual activity: Yes  Lifestyle  . Physical activity:    Days per week: Not on file    Minutes per session: Not on file  . Stress: Not on file  Relationships  . Social connections:    Talks on phone: Not on file    Gets together: Not on file    Attends religious service: Not on file    Active member of club or organization: Not on file    Attends meetings of clubs or organizations: Not on file    Relationship status: Not on file  . Intimate partner violence:    Fear of current or ex partner: Not on file    Emotionally abused: Not on file    Physically abused: Not on file    Forced sexual activity: Not on file  Other Topics Concern  . Not on file  Social History Narrative  . Not on file    FAMILY HISTORY:  Family History  Problem Relation Age of Onset  . Arthritis Unknown   . Cancer Unknown   . Diabetes Unknown   . Hypertension Mother   . Dementia Mother   . Diabetes Father   . ALS Father   . Diabetes Brother   . Hypertension Brother   . Cancer Paternal Aunt   . COPD Maternal Grandmother   . Cancer Maternal Grandfather   . Anesthesia problems Paternal Grandfather     CURRENT MEDICATIONS:  Outpatient Encounter Medications as of 05/20/2018  Medication Sig Note  . acyclovir (ZOVIRAX) 800 MG tablet    . albuterol (PROVENTIL HFA;VENTOLIN HFA) 108 (90 Base) MCG/ACT inhaler Inhale 1-2 puffs into the lungs daily as needed.   Marland Kitchen allopurinol (ZYLOPRIM) 300 MG tablet TAKE 1 TABLET BY MOUTH  DAILY FOR GOUT   . bumetanide (BUMEX) 1 MG tablet Take 1 tablet (1 mg total) by mouth daily.   . Calcium Carb-Cholecalciferol (CALCIUM 600+D3 PO) Take by mouth. 11/08/2017: Calcium 66m with Vitamin D 1,000 units BID  . carvedilol (COREG) 6.25 MG tablet Take by mouth.   . diazepam (VALIUM) 5 MG tablet TAKE 1 TABLET BY MOUTH AT BEDTIME AS NEEDED FOR ANXIETY OR INSOMNIA   . docusate sodium (COLACE) 100 MG capsule Take  by mouth.   . fluticasone (FLONASE) 50 MCG/ACT nasal spray Place 2 sprays into the nose 2 (two) times daily.   .Marland Kitchenglucose blood (PRECISION QID TEST) test strip Use 1 each (1 strip total) 4 (four) times daily Use as instructed. OneTouch Verio   . HUMALOG KWIKPEN 100 UNIT/ML KiwkPen INJECT THREE TIMES DAILY BEFORE MEALS PER CORRECTION SCALE. MAX DOSE PER DAY IS 35 UNITS   . HYDROcodone-acetaminophen (NORCO/VICODIN) 5-325 MG tablet Take 1 tablet by mouth every 6 (  six) hours as needed for moderate pain.   . Lancets Misc. (UNISTIK 2 NORMAL) MISC Use 1 each 4 (four) times daily Use as instructed.   Marland Kitchen losartan (COZAAR) 100 MG tablet Take 1 tablet (100 mg total) by mouth daily.   . metFORMIN (GLUCOPHAGE) 500 MG tablet TAKE 1 TABLET BY MOUTH ONCE DAILY WITH BREAKFAST   . metFORMIN (GLUCOPHAGE-XR) 500 MG 24 hr tablet    . omeprazole (PRILOSEC) 20 MG capsule Take 1 capsule (20 mg total) by mouth daily.   . ondansetron (ZOFRAN) 8 MG tablet Take 1 tablet (8 mg total) by mouth every 8 (eight) hours as needed.   Glory Rosebush VERIO test strip USE 1 STRIP TO CHECK GLUCOSE 4 TIMES DAILY AS DIRECTED   . potassium chloride SA (K-DUR,KLOR-CON) 20 MEQ tablet    . pregabalin (LYRICA) 100 MG capsule    . prochlorperazine (COMPAZINE) 10 MG tablet Take 1 tablet (10 mg total) by mouth every 6 (six) hours as needed for nausea or vomiting.   . rosuvastatin (CRESTOR) 5 MG tablet    . TRULICITY 3.33 LK/5.6YB SOPN    . verapamil (CALAN-SR) 240 MG CR tablet Take 1 tablet (240 mg total) by mouth at bedtime.   . Vitamin D, Ergocalciferol, (DRISDOL) 50000 units CAPS capsule    . lenalidomide (REVLIMID) 10 MG capsule Take 1 capsule (10 mg total) by mouth daily. 3 weeks on/1 week off   . [DISCONTINUED] aspirin EC 81 MG tablet Take 81 mg by mouth daily.   . [DISCONTINUED] dexamethasone (DECADRON) 4 MG tablet Take 10 tablets (40 mg total) by mouth once a week.   . [DISCONTINUED] Docosanol 10 % CREA Apply topically.   . [DISCONTINUED]  insulin glargine (LANTUS) 100 UNIT/ML injection Inject 0.15 mLs (15 Units total) into the skin at bedtime.   . [DISCONTINUED] Lactulose 20 GM/30ML SOLN Take 68m by mouth every 3-4 hours until bowel movement. Then take 370mby mouth daily.   . [DISCONTINUED] lenalidomide (REVLIMID) 25 MG capsule Take 1 capsule by mouth daily x 14 days in a row followed by a 7 day rest period. Then resume regimen.   . [DISCONTINUED] meloxicam (MOBIC) 15 MG tablet TAKE 1 TABLET BY MOUTH ONCE DAILY   . [DISCONTINUED] polyethylene glycol powder (GLYCOLAX/MIRALAX) powder Take 1 Container by mouth daily.   . [DISCONTINUED] pregabalin (LYRICA) 200 MG capsule Take 1 capsule (200 mg total) by mouth 2 (two) times daily.    No facility-administered encounter medications on file as of 05/20/2018.     ALLERGIES:  No Known Allergies   PHYSICAL EXAM:  ECOG Performance status: 1  Vitals:   05/20/18 1021  BP: (!) 145/97  Pulse: 99  Resp: 18  Temp: 98.2 F (36.8 C)  SpO2: 97%   Filed Weights   05/20/18 1021  Weight: 270 lb 9.6 oz (122.7 kg)    Physical Exam  Constitutional: She is oriented to person, place, and time. She appears well-developed and well-nourished.  Cardiovascular: Normal rate, regular rhythm and normal heart sounds.  Pulmonary/Chest: Effort normal and breath sounds normal.  Musculoskeletal: Normal range of motion.  Neurological: She is alert and oriented to person, place, and time.  Skin: Skin is warm and dry.  Psychiatric: She has a normal mood and affect. Her behavior is normal. Judgment and thought content normal.     LABORATORY DATA:  I have reviewed the labs as listed.  CBC    Component Value Date/Time   WBC 6.3 05/20/2018 0907  RBC 3.47 (L) 05/20/2018 0907   HGB 10.7 (L) 05/20/2018 0907   HGB 14.2 08/02/2015 1113   HCT 33.3 (L) 05/20/2018 0907   HCT 41.1 08/02/2015 1113   PLT 233 05/20/2018 0907   PLT 371 08/02/2015 1113   MCV 96.0 05/20/2018 0907   MCV 84 08/02/2015 1113     MCH 30.8 05/20/2018 0907   MCHC 32.1 05/20/2018 0907   RDW 15.0 05/20/2018 0907   RDW 15.1 08/02/2015 1113   LYMPHSABS 1.3 05/20/2018 0907   LYMPHSABS 2.9 08/02/2015 1113   MONOABS 0.6 05/20/2018 0907   EOSABS 0.3 05/20/2018 0907   EOSABS 0.2 08/02/2015 1113   BASOSABS 0.0 05/20/2018 0907   BASOSABS 0.1 08/02/2015 1113   CMP Latest Ref Rng & Units 05/20/2018 05/01/2018 04/26/2018  Glucose 70 - 99 mg/dL 127(H) 123(H) 110(H)  BUN 6 - 20 mg/dL 19 19 18   Creatinine 0.44 - 1.00 mg/dL 1.07(H) 1.13(H) 0.92  Sodium 135 - 145 mmol/L 140 140 139  Potassium 3.5 - 5.1 mmol/L 3.9 3.6 3.4(L)  Chloride 98 - 111 mmol/L 106 107 109  CO2 22 - 32 mmol/L 25 23 23   Calcium 8.9 - 10.3 mg/dL 9.3 9.6 7.8(L)  Total Protein 6.5 - 8.1 g/dL 7.1 7.1 5.8(L)  Total Bilirubin 0.3 - 1.2 mg/dL 0.8 0.8 0.8  Alkaline Phos 38 - 126 U/L 75 57 52  AST 15 - 41 U/L 41 54(H) 41  ALT 0 - 44 U/L 21 32 24         ASSESSMENT & PLAN:   Multiple myeloma ([facility]) 1.  IgG lambda multiple myeloma, stage III, del 17p: - Presentation with lower back pain since March, presented to the emergency room on 10/25/2017 with right groin pain, CT scan showed multiple lytic lesions, multiple lytic lesions on skeletal survey - SPEP shows 1.5 g/dL of monoclonal protein, beta-2 microglobulin of 4.8, free lambda light chains of 2869, kappa by lambda ratio of less than 0, elevated LDH. 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  FISH panel shows -14, -12, 13 q.-/-13 and 17p-.  Bone marrow biopsy shows 90% plasma cells. -4 cycles of RVD from 11/01/2017 through 01/14/2018.  Labs done on 01/20/2018 showed M spike to be negative. - She was evaluated by Dr. Laverta Baltimore at [facility].  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  She was discharged home  on 03/07/2018.  Her energy levels are improving. - I have reviewed blood work today which shows normalized CBC.  Her creatinine went up slightly as she was started back on Bumex. - She was restarted back on Zometa on 04/22/2018. -Most recent blood work from [facility] shows negative SPEP, immunofixation and normal free light chain and ratio. - She was started back on Zometa on 04/22/2018. - Today is day 40 of transplant.  We discussed maintenance therapy with Revlimid 10 mg 3 weeks on 1 week off and Velcade 1.3 mg/m subcu every 2 weeks.  We discussed the side effects also in detail.  We plan to start it in a couple of weeks. -She will continue to have her vaccines at [facility].  2.  Low back pain: Her low back pain completely improved after starting myeloma therapy.  She is not taking any pain medicine.  3.  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 -She is taking Lyrica 200 mg twice daily which is controlling her symptoms.  4.  ID prophylaxis:She will continue acyclovir twice daily.  5.  Hypomagnesemia: -Her magnesium today is 1.3.  She is currently taking magnesium 1 tablet twice daily.  I have told her to increase it to 2 tablets twice daily.  6.  Diabetes: - She is continuing metformin thousand milligrams twice daily. -She was recently started on Trulicity and Lantus 7 units at bedtime.      Orders placed this encounter:  No orders of the defined types were placed in this encounter.     Derek Jack, MD [place] [phone]

## 2018-05-20 NOTE — Progress Notes (Signed)
Labs reviewed and pt seen by Dr. Delton Coombes who approved pt for Zometa today. Ca 9.3 and pt denies any recent dental work, tooth, or jaw pain. Zometa infused without incident. Per Dr. Delton Coombes, pt to start Revlimid and Velcade when authorized and pt has gotten the Revlimid. RX for Revlimid obtained from Dr. Delton Coombes and given to [facility] to begin authorization process. Staff message also sent to Angie to make her aware. Pt scheduled for 2 weeks out for possible start of Velcade. VSS upon completion of treatment. Pt discharged self ambulatory in satisfactory condition.

## 2018-05-20 NOTE — Progress Notes (Signed)
DISCONTINUE ON PATHWAY REGIMEN - Multiple Myeloma and Other Plasma Cell Dyscrasias  No Medical Intervention - Off Treatment.  REASON: Other Reason PRIOR TREATMENT: [id number]: Referral to Transplant Service  Multiple Myeloma and Other Plasma Cell Dyscrasias - No Medical Intervention - Off Treatment.  Patient Characteristics: Newly Diagnosed, Transplant Eligible, High Risk R-ISS Staging: II Disease Classification: Newly Diagnosed Is Patient Eligible for Transplant<= Transplant Eligible Risk Status: High Risk

## 2018-05-20 NOTE — Progress Notes (Signed)
DISCONTINUE ON PATHWAY REGIMEN - Multiple Myeloma and Other Plasma Cell Dyscrasias     A cycle is every 21 days:     Bortezomib      Lenalidomide      Dexamethasone   **Always confirm dose/schedule in your pharmacy ordering system**  REASON: Continuation Of Treatment PRIOR TREATMENT: [id number]: VRd (Bortezomib 1.3 mg/m2 Subcut D1, 4, 8, 11 + Lenalidomide 25 mg + Dexamethasone 20 mg) q21 Days x 4-6 Cycles Maximum Prior to Stem Cell [facility] TREATMENT RESPONSE: Complete Response (CR)  Multiple Myeloma and Other Plasma Cell Dyscrasias - No Medical Intervention - Off Treatment.  Patient Characteristics: Newly Diagnosed, Transplant Eligible, Unknown or Awaiting Test Results R-ISS Staging: II Disease Classification: Newly Diagnosed Is Patient Eligible for Transplant<= Transplant Eligible Risk Status: Awaiting Test Results

## 2018-05-20 NOTE — Telephone Encounter (Signed)
Oral Oncology Patient Advocate Encounter  Prior Authorization Approval for Revlimid information provided below.    PA# [id number] Effective dates: 11/04/17 through 11/05/18  [place] Patient [place] Phone [phone] Fax [phone] 05/20/2018 3:05 PM

## 2018-05-26 ENCOUNTER — Other Ambulatory Visit: Payer: Self-pay | Admitting: Family Medicine

## 2018-05-27 ENCOUNTER — Telehealth: Payer: Self-pay | Admitting: Family Medicine

## 2018-05-27 ENCOUNTER — Other Ambulatory Visit: Payer: Self-pay | Admitting: *Deleted

## 2018-05-27 DIAGNOSIS — I517 Cardiomegaly: Secondary | ICD-10-CM | POA: Diagnosis not present

## 2018-05-27 DIAGNOSIS — C9001 Multiple myeloma in remission: Secondary | ICD-10-CM | POA: Diagnosis not present

## 2018-05-27 DIAGNOSIS — I10 Essential (primary) hypertension: Secondary | ICD-10-CM | POA: Diagnosis not present

## 2018-05-27 MED ORDER — ALBUTEROL SULFATE HFA 108 (90 BASE) MCG/ACT IN AERS: 1.0000 | INHALATION_SPRAY | Freq: Every day | RESPIRATORY_TRACT | 3 refills | Status: DC | PRN

## 2018-05-27 NOTE — Telephone Encounter (Signed)
Pharmacy requesting refill on Proair inhaler. Inhale 1-2 puffs daily as needed. Please advise. Thank you.

## 2018-05-27 NOTE — Telephone Encounter (Signed)
Ok plus 3 ref 

## 2018-05-27 NOTE — Telephone Encounter (Signed)
Refills sent to pharm

## 2018-05-30 ENCOUNTER — Other Ambulatory Visit (HOSPITAL_COMMUNITY): Payer: Self-pay | Admitting: *Deleted

## 2018-05-30 ENCOUNTER — Other Ambulatory Visit (HOSPITAL_COMMUNITY): Payer: Self-pay | Admitting: Pharmacist

## 2018-05-30 ENCOUNTER — Other Ambulatory Visit (HOSPITAL_COMMUNITY): Payer: Self-pay | Admitting: Hematology

## 2018-05-30 DIAGNOSIS — C9000 Multiple myeloma not having achieved remission: Secondary | ICD-10-CM

## 2018-05-30 MED ORDER — LENALIDOMIDE 25 MG PO CAPS: 25.0000 mg | ORAL_CAPSULE | Freq: Every day | ORAL | 0 refills | Status: DC

## 2018-05-30 MED ORDER — DEXAMETHASONE 4 MG PO TABS: 40.0000 mg | ORAL_TABLET | ORAL | 0 refills | Status: DC

## 2018-06-02 ENCOUNTER — Other Ambulatory Visit (HOSPITAL_COMMUNITY): Payer: Self-pay | Admitting: *Deleted

## 2018-06-02 MED ORDER — PREGABALIN 200 MG PO CAPS: 200.0000 mg | ORAL_CAPSULE | Freq: Two times a day (BID) | ORAL | 0 refills | Status: DC

## 2018-06-02 NOTE — Telephone Encounter (Signed)
Pt called wanting 90 day supply of Lyrica. Prescription sent for refill.

## 2018-06-03 DIAGNOSIS — M1712 Unilateral primary osteoarthritis, left knee: Secondary | ICD-10-CM | POA: Diagnosis not present

## 2018-06-03 DIAGNOSIS — M1711 Unilateral primary osteoarthritis, right knee: Secondary | ICD-10-CM | POA: Diagnosis not present

## 2018-06-09 NOTE — Patient Instructions (Addendum)
[facility] Chemotherapy Teaching   You have been diagnosed with multiple myeloma.  We are going to be treating you with palliative intent. This means that your cancer is not curable but is treatable.  We are going to be giving you Velcade (bortezomib) injections, Revlimid (lenalitomide) and Decadron (dexamethasone).  You will start with getting the velcade injections weekly for 6 weeks then we will switch to every other week.  We will start the revlimid 25 mg for 14 days then 7 days off for 2 cycles then we will switch to 10 mg for 14 days on 7 days off.  The dexamethasone you will take weekly with Velcade injections.  You will see the doctor regularly throughout treatment.  We monitor your lab work prior to every treatment. The doctor monitors your response to treatment by the way you are feeling, your blood work, and scans periodically.  There will be wait times while you are here for treatment.  It will take about 30 minutes to 1 hour for your lab work to result.  Then there will be wait times while pharmacy mixes your medications.   Medications that you will take prior to receiving the Velcade injection: Compazine: nausea medication to prevent chemotherapy induced nausea   Bortezomib (Velcade)  About This Drug Bortezomib is used to treat cancer. It is given in the vein (IV) or by a shot under the skin (subcutaneously).  Possible Side Effects . Bone marrow suppression. Decrease in the number of white blood cells, red blood cells, and platelets. This may raise your risk of infection, make you tired and weak (fatigue), and raise your risk of bleeding. . Nausea and vomiting (throwing up) . Constipation (not able to move bowels) . Diarrhea (loose bowel movements) . Fever . Tiredness . Decreased appetite (decreased hunger) . Effects on the nerves are called peripheral neuropathy. You may feel numbness, tingling, or pain in your hands and feet. It may be hard for you to button  your clothes, open jars, or walk as usual. The effect on the nerves may get worse with more doses of the drug. These effects get better in some people after the drug is stopped but it does not get better in all people. . Rash  Note: Each of the side effects above was reported in 20% or greater of patients treated with bortezomib. Not all possible side effects are included above.  Warnings and Precautions . Severe peripheral neuropathy . Low blood pressure . Congestive heart failure - your heart has less ability to pump blood properly. . Trouble breathing because of fluid build-up and/or inflammation in your lungs . Nausea, vomiting, diarrhea and constipation which sometimes requires treatment to help lessen these side effects. There is also an increased risk of developing a partial or complete blockage of your small and/or large intestine. . Changes in your central nervous system can happen. The central nervous system is made up of your brain and spinal cord. You could feel extreme tiredness, agitation, confusion, have hallucinations (see or hear things that are not there), trouble understanding or speaking, loss of control of your bowels or bladder, eyesight changes, numbness or lack of strength to your arms, legs, face, or body, seizures or coma. If you start to have any of these symptoms let your doctor know right away. . Tumor lysis syndrome: This drug may act on the cancer cells very quickly. This may affect how your kidneys work. . Changes in your liver function Increased risk of a  syndrome that affects your red blood cells, platelets and blood vessels in your kidneys, which can cause kidney failure and be life-threatening.  Important Information . This drug may be present in the saliva, tears, sweat, urine, stool, vomit, semen, and vaginal secretions. Talk to your doctor and/or your nurse about the necessary precautions to take during this time. . This drug may impair your  ability to drive or use machinery. Use caution and tell your nurse or doctor if you feel dizzy, very sleepy, and/or experience low blood pressure.  Treating Side Effects . Manage tiredness by pacing your activities for the day. . Be sure to include periods of rest between energy-draining activities. . To decrease the risk of infection, wash your hands regularly. . Avoid close contact with people who have a cold, the flu, or other infections. . Take your temperature as your doctor or nurse tells you, and whenever you feel like you may have a fever. . To help decrease the risk of bleeding, use a soft toothbrush. Check with your nurse before using dental floss. . Be very careful when using knives or tools. . Use an electric shaver instead of a razor. . Ask your doctor or nurse about medicines that are available to help stop or lessen constipation. . If you are not able to move your bowels, check with your doctor or nurse before you use enemas, laxatives, or suppositories. . Drink plenty of fluids (a minimum of eight glasses per day is recommended). . If you throw up or have loose bowel movements, you should drink more fluids so that you do not become dehydrated (lack of water in the body from losing too much fluid). . If you have diarrhea, eat low-fiber foods that are high in protein and calories and avoid foods that can irritate your digestive tracts or lead to cramping. . Ask your nurse or doctor about medicine that can lessen or stop your diarrhea. . To help with nausea and vomiting, eat small, frequent meals instead of three large meals a day. Choose foods and drinks that are at room temperature. Ask your nurse or doctor about other helpful tips and medicine that is available to help stop or lessen these symptoms. . To help with decreased appetite, eat foods high in calories and protein, such as meat, poultry, fish, dry beans, tofu, eggs, nuts, milk, yogurt, cheese, ice cream, pudding,  and nutritional supplements. . Consider using sauces and spices to increase taste. Daily exercise, with your doctor's approval, may increase your appetite. . If you have numbness and tingling in your hands and feet, be careful when cooking, walking, and handling sharp objects and hot liquids. . If you get a rash do not put anything on it unless your doctor or nurse says you may. Keep the area around the rash clean and dry. Ask your doctor for medicine if your rash bothers you.  Food and Drug Interactions . This drug may interact with grapefruit and grapefruit juice. Talk to your doctor as this could make side effects worse. . Check with your doctor or pharmacist about all other prescription medicines and over-the-counter medicines and dietary supplements (vitamins, minerals, herbs and others) you are taking before starting this medicine as there are known drug interactions with bortezomib. Also, check with your doctor or pharmacist before starting any new prescription or over-the-counter medicines, or dietary supplements to make sure that there are no interactions. . Avoid the use of St. John's Wort with bortezomib as this may lower the levels  of the drug in your body, which can make it less effective.  When to Call the Doctor Call your doctor or nurse if you have any of these symptoms and/or any new or unusual symptoms: . Fever of 100.4 F (38 C) or higher . Chills . Tiredness that interferes with your daily activities . Feeling dizzy or lightheaded . Feeling that your heart is beating in a fast or not normal way (palpitations) . Cough . Wheezing or trouble breathing . Easy bleeding or bruising . Confusion and/or agitation . Hallucinations . Trouble understanding or speaking . Blurry vision or changes in your eyesight . Numbness or lack of strength to your arms, legs, face, or body . Symptoms of a seizure such as confusion, blacking out, passing out, loss of hearing or  vision, blurred vision, unusual smells or tastes (such as burning rubber), trouble talking, tremors or shaking in parts or all of the body, repeated body movements, tense muscles that do not relax, and loss of control of urine and bowels. If you or your family member suspects you are having a seizure, call 911 right away. . Nausea that stops you from eating or drinking and/or is not relieved by prescribed medicines . Throwing up more than 3 times a day . Lasting loss of appetite or rapid weight loss of five pounds in a week . No bowel movement in 3 days or when you feel uncomfortable. . Abdominal pain that does not go away . Diarrhea, 4 times in one day or diarrhea with lack of strength or a feeling of being dizzy . Numbness, tingling, or pain your hands and feet . Swelling of legs, ankles, and/or feet . Weight gain of 5 pounds in one week (fluid retention) . Decreased urine, or very dark urine . New rash and/or itching . Rash that is not relieved by prescribed medicines . Signs of tumor lysis: Confusion or agitation, decreased urine, nausea/vomiting, diarrhea, muscle cramping, numbness and/or tingling, seizures. . Signs of possible liver problems: dark urine, pale bowel movements, bad stomach pain, feeling very tired and weak, unusual itching, or yellowing of the eyes or skin . If you think you are pregnant or may have impregnated your partner  Reproduction Warnings . Pregnancy warning: This drug can have harmful effects on the unborn baby. Women of child bearing potential should use effective methods of birth control during your cancer treatment and for at least 7 months after treatment. Men with female partners of childbearing potential should use effective methods of birth control during your cancer treatment and for at least 4 months after your cancer treatment. Let your doctor know right away if you think you may be pregnant or may have impregnated your partner. . Breastfeeding  warning: Women should not breastfeed during treatment and for 2 months month after treatment because this drug could enter the breast milk and cause harm to a breastfeeding baby. . Fertility warning: In men and women both, this drug may affect your ability to have children in the future. Talk with your doctor or nurse if you plan to have children. Ask for information on sperm or egg banking.  Dexamethasone (Decadron)  About This Drug Dexamethasone is used to treat cancer, to decrease inflammation and sometimes used before and after chemotherapy to prevent or treat nausea and/or vomiting. It is given in the vein (IV) or orally (by mouth).  Possible Side Effects . Headache . High blood pressure . Abnormal heart beat . Tiredness and weakness . Changes in mood,  which may include depression or a feeling of extreme well-being . Trouble sleeping . Increased sweating . Increased appetite (increased hunger) . Weight gain . Increase risk of infections . Pain in your abdomen . Nausea . Skin changes such as rash, dryness, redness . Blood sugar levels may change . Electrolyte changes . Swelling of your legs, ankles and/or feet . Changes in your liver function . You may be at risk for cataracts, glaucoma or infections of the eye . Muscle loss and / or weakness (lack of muscle strength) . Increased risk of developing osteoporosis- your bones may become weak and brittle  Note: Not all possible side effects are included above.  Warnings and Precautions . This drug may cause you to feel irritable, nervous or restless. . Allergic reactions, including anaphylaxis are rare but may happen in some patients. Signs of allergic reaction to this drug may be swelling of the face, feeling like your tongue or throat are swelling, trouble breathing, rash, itching, fever, chills, feeling dizzy, and/or feeling that your heart is beating in a fast or not normal way. If this happens, do not take another dose  of this drug. You should get urgent medical treatment. . High blood pressure and changes in electrolytes, which can cause fluid build-up around your heart, lungs or elsewhere. . Increased risk of developing a hole in your stomach, small, and/or large intestine if you have ulcers in the lining of your stomach and/or intestine, or have diverticulitis, ulcerative colitis and/or other diseases that affect the gastrointestinal tract. . Effects on the endocrine glands including the pituitary, adrenals or thyroid during or after use of this medication. . Changes in the tissue of the heart, that can cause your heart to have less ability to pump blood. You may be short of breath or our arms, hands, legs and feet may swell. . Increased risk of heart attack. . Severe depression and other psychiatric disorders such as mood changes. . Burning, pain and itching around your anus may happen when this drug is given in the vein too rapidly (IV). It usually happens suddenly and resolves in less than 1 minute.  Important Information . Talk to your doctor or your nurse before stopping this medication, it should be stopped gradually. Depending on the dose and length of treatment, you could experience serious side effects if stopped abruptly (suddenly). . Talk to your doctor before receiving any vaccinations during your treatment. Some vaccinations are not recommended while receiving dexamethasone.  How to Take Your Medication . For Oral (by mouth): You can take the medicine with or without food. If you have nausea or upset stomach, take it with food. . Missed dose: If you miss a dose, do not take 2 doses at the same time or extra doses. . If you vomit a dose, take your next dose at the regular time. Do not take 2 doses at the same time . Handling: Wash your hands after handling your medicine, your caretakers should not handle your medicine with bare hands and should wear latex gloves. . Storage: Store this  medicine in the original container at room temperature. Protect from moisture and light. Discuss with your nurse or your doctor how to dispose of unused medicine.  Treating Side Effects . Drink plenty of fluids (a minimum of eight glasses per day is recommended). . To help with nausea and vomiting, eat small, frequent meals instead of three large meals a day. Choose foods and drinks that are at room temperature. Ask your  nurse or doctor about other helpful tips and medicine that is available to help stop or lessen these symptoms. . If you throw up, you should drink more fluids so that you do not become dehydrated (lack of water in the body from losing too much fluid). . Manage tiredness by pacing your activities for the day. . Be sure to include periods of rest between energy-draining activities. . To help with muscle weakness, get regular exercise. If you feel too tired to exercise vigorously, try taking a short walk. . If you are having trouble sleeping, talk to your nurse or doctor on tips to help you sleep better. . If you are feeling depressed, talk to your nurse or doctor about it. Marland Kitchen Keeping your pain under control is important to your well-being. Please tell your doctor or nurse if you are experiencing pain. . If you have diabetes, keep good control of your blood sugar level. Tell your nurse or your doctor if your glucose levels are higher or lower than normal. . To decrease the risk of infection, wash your hands regularly. . Avoid close contact with people who have a cold, the flu, or other infections. . Take your temperature as your doctor or nurse tells you, and whenever you feel like you may have a fever. . If you get a rash do not put anything on it unless your doctor or nurse says you may. Keep the area around the rash clean and dry. Ask your doctor for medicine if your rash bothers you. . Moisturize your skin several times day. . Avoid sun exposure and apply sunscreen  routinely when outdoors.  Food and Drug Interactions . There are no known interactions of dexamethasone with food. . Check with your doctor or pharmacist about all other prescription medicines and over-the-counter medicines and dietary supplements (vitamins, minerals, herbs and others) you are taking before starting this medicine as there are known drug interactions with dexamethasone. Also, check with your doctor or pharmacist before starting any new prescription or over-the-counter medicines, or dietary supplement to make sure that there are no interactions. . There are known interactions of dexamethasone with other medicines and products like acetaminophen, aspirin, and ibuprofen. Ask your doctor what over-the-counter (OTC) medicines you can take.  When to Call the Doctor Call your doctor or nurse if you have any of these symptoms and/or any new or unusual symptoms: . Fever of 100.4 F (38 C) or higher . Chills . A headache that does not go away . Trouble breathing . Blurry vision or other changes in eyesight . Feel irritable, nervous or restless . Trouble falling or staying asleep . Severe mood changes such as depression or unusual thoughts and/or behaviors . Thoughts of hurting yourself or others, and suicide . Tiredness that interferes with your daily activities . Feeling that your heart is beating in a fast, slow or not normal way . Feeling dizzy or lightheaded . Chest pain or symptoms of a heart attack. Most heart attacks involve pain in the center of the chest that lasts more than a few minutes. The pain may go away and come back, or it can be constant. It can feel like pressure, squeezing, fullness, or pain. Sometimes pain is felt in one or both arms, the back, neck, jaw, or stomach. If any of these symptoms last 2 minutes, call 911. Marland Kitchen Heartburn or indigestion . Nausea that stops you from eating or drinking and/or is not relieved by prescribed medicines . Throwing up more  than  3 times a day . Pain in your abdomen that does not go away . Abnormal blood sugar . Unusual thirst, passing urine often, headache, sweating, shakiness, irritability . Swelling of legs, ankles, or feet . Weight gain of 5 pounds in one week (fluid retention) . Signs of possible liver problems: dark urine, pale bowel movements, bad stomach pain, feeling very tired and weak, unusual itching, or yellowing of the eyes or skin . Severe muscle weakness . A new rash or a rash that is not relieved by prescribed medicines . Signs of allergic reaction: swelling of the face, feeling like your tongue or throat are swelling, trouble breathing, rash, itching, fever, chills, feeling dizzy, and/or feeling that your heart is beating in a fast or not normal way. If this happens, call 911 for emergency care. . If you think you may be pregnant  Reproduction Warnings . Pregnancy warning: It is not known if this drug may harm an unborn child. For this reason, be sure to talk with your doctor if you are pregnant or planning to become pregnant while receiving this drug. Let your doctor know right away if you think you may be pregnant or may have impregnated your partner. . Breastfeeding warning: It is not known if this drug passes into breast milk. For this reason, women should talk to their doctor about the risks and benefits of breastfeeding during treatment with this drug because this drug may enter the breast milk and cause harm to a breastfeeding baby. . Fertility warning: Human fertility studies have not been done with this drug. Talk with your doctor or nurse if you plan to have children. Ask for information on sperm banking.  Lenalidomide (Revlimid)  About This Drug Lenalidomide is used to treat cancer. It is given orally (by mouth).  Possible Side Effects . Bone marrow suppression. This is a decrease in the number of white blood cells, red blood cells, and platelets. This may raise your risk of  infection, make you tired and weak (fatigue), and raise your risk of bleeding . Nausea . Diarrhea (loose bowel movements) . Constipation (unable to move bowels) . Inflammation of your stomach and/or intestines . Pain in your abdomen . Fever . Tiredness and weakness . Swelling of your legs, ankles and/or feet . Decreased appetite (decreased hunger) . Muscle cramps/spasms . Back pain . Pain in your joints . Headache . Feeling dizzy . Tremor . Trouble sleeping . Nosebleed . Upper respiratory infection, bronchitis . Inflammation of the nasal passages and throat . Trouble breathing . Cough . Rash and itching  Note: Each of the side effects above was reported in 15% or greater of patients treated with lenalidomide. Not all possible side effects are included above.  Warnings and Precautions . Blood clots and events such as stroke and heart attack. A blood clot in your leg may cause your leg to swell, appear red and warm, and/or cause pain. A blood clot in your lungs may cause trouble breathing, pain when breathing, and/or chest pain. . Severe bone marrow suppression. . Changes in your liver function, which may cause liver failure and be life-threatening. . Tumor lysis syndrome: This drug may act on the cancer cells very quickly. This may affect how your kidneys work and can be life-threatening. . Changes in your thyroid function . Severe allergic skin reaction which may be life-threatening. You may develop blisters on your skin that are filled with fluid or a severe red rash all over your body that may be  painful. . This drug may raise your risk of getting a second cancer. . You may develop a syndrome called tumor flare reaction. You may have painful lymph nodes, enlarged spleen, fever and a rash. . This drug may make it more difficult to collect your stem cells if an autologous stem cell transplant is part of your treatment plan. . There is a rare increased risk of death in  patients with chronic lymphocytic leukemia and a risk of early death (dying sooner) in patient with mantle cell lymphoma.  Note: Some of the side effects above are very rare. If you have concerns and/or questions, please discuss them with your medical team.  Important Information . You will need to sign up for a special program called Revlimid REMS when you start taking this drug. Your nurse will help you get started. . Do not donate blood during your treatment and for 4 weeks after your treatment. . Men should not donate sperm during your treatment and for 4 weeks after your treatment because this drug is present in semen and may badly harm a baby.  How to Take Your Medication . Swallow the medicine whole with water, with or without food. Do not chew, break, or open it. . Take this medicine at the same time each day . Missed dose: If you miss a dose, take it as soon as you think about it ONLY if it has been less than 12 hours since your regular time. If it has been more than 12 hours, skip the missed dose and contact your physician. Take your next dose at the regular time. Do not take 2 doses at the same time and do not double up on the next dose. . If you vomit a dose, take your next dose at the regular time. . Handling: Wash your hands after handling your medicine, your caretakers should not handle your medicine with bare hands and should wear latex gloves. . If you get any of the content of a broken capsules on your skin, you should wash the area of the skin well with soap and water right away. Call your doctor if you get a skin reaction. . This drug may be present in the saliva, tears, sweat, urine, stool, vomit, semen, and vaginal secretions. Talk to your doctor and/or your nurse about the necessary precautions to take during this time. . Storage: Store this medicine in the original container at room temperature. . Disposal of unused medicine: Do not flush any expired and/or  unused medicine down the toilet or drain unless you are specifically instructed to do so on the medication label. Some facilities have take-back programs and/or other options. If you do not have a take-back program in your area, then please discuss with your nurse or your doctor how to dispose of unused medicine.  Treating Side Effects . Manage tiredness by pacing your activities for the day. . Be sure to include periods of rest between energy-draining activities. . If you are dizzy, get up slowly after sitting or lying. . To decrease the risk of infection, wash your hands regularly. . Avoid close contact with people who have a cold, the flu, or other infections. . Take your temperature as your doctor or nurse tells you, and whenever you feel like you may have a fever. . To help decrease the risk of bleeding, use a soft toothbrush. Check with your nurse before using dental floss. . Be very careful when using knives or tools. . Use an Chief [employer] Officer  instead of a razor. . Ask your doctor or nurse about medicines that are available to help stop or lessen constipation. . If you are not able to move your bowels, check with your doctor or nurse before you use enemas, laxatives, or suppositories. . Drink plenty of fluids (a minimum of eight glasses per day is recommended). . Drink fluids that contribute calories (whole milk, juice, soft drinks, sweetened beverages, milkshakes, and nutritional supplements) instead of water. . Include a source of protein at every meal and snack, such as meat, poultry, fish, dry beans, tofu, eggs, nuts, milk, yogurt, cheese, ice cream, pudding, and nutritional supplements. . If you throw up or have loose bowel movements, you should drink more fluids so that you do not become dehydrated (lack of water in the body from losing too much fluid). . If you have diarrhea, eat low-fiber foods that are high in protein and calories and avoid foods that can irritate your  digestive tracts or lead to cramping. . Ask your nurse or doctor about medicine that can lessen or stop your diarrhea. . To help with nausea and vomiting, eat small, frequent meals instead of three large meals a day. Choose foods and drinks that are at room temperature. Ask your nurse or doctor about other helpful tips and medicine that is available to help stop or lessen these symptoms. . To help with decreased appetite, eat small, frequent meals. . Eat high caloric food such as pudding, ice cream, yogurt and milkshakes. . If you get a rash, do not put anything on it unless your doctor or nurse says you may. Keep the area around the rash clean and dry. Ask your doctor for medicine if your rash bothers you. Marland Kitchen Keeping your pain under control is important to your well-being. Please tell your doctor or nurse if you are experiencing pain. . If you are having trouble sleeping, talk to your nurse or doctor on tips to help you sleep better. . If you have a nose bleed, sit with your head tipped slightly forward. Apply pressure by lightly pinching the bridge of your nose between your thumb and forefinger. Call your doctor if you feel dizzy or faint or if the bleeding doesn't stop after 10 to 15 minutes. . Moisturize your skin several times a day . Avoid sun exposure and apply sunscreen routinely when outdoors  Food and Drug Interactions . There are no known interactions of lenalidomide with food. . Check with your doctor or pharmacist about all other prescription medicines and over-the-counter medicines and dietary supplements (vitamins, minerals, herbs and others) you are taking before starting this medicine as there are known drug interactions with lenalidomide. Also, check with your doctor or pharmacist before starting any new prescription or over-the-counter medicines, or dietary supplements to make sure that there are no interactions. . There are known interactions of lenalidomide with  blood-thinning medicine such as warfarin. Ask your doctor what precautions you should take.  When to Call the Doctor Call your doctor or nurse if you have any of these symptoms and/or any new or unusual symptoms: . Fever of 100.4 F (38 C) or higher . Chills . Tiredness that interferes with your daily activities . Feeling dizzy or lightheaded . Easy bleeding or bruising . Your leg or arm is swollen, red, warm and/or painful . Headache that does not go away . Nose bleed that doesn't stop bleeding after 10-15 minutes . Painful lymph nodes . Wheezing and/or trouble breathing . Chest pain or symptoms  of a heart attack. Most heart attacks involve pain in the center of the chest that lasts more than a few minutes. The pain may go away and come back. It can feel like pressure, squeezing, fullness, or pain. Sometimes pain is felt in one or both arms, the back, neck, jaw, or stomach. If any of these symptoms last 2 minutes, call 911. Marland Kitchen Symptoms of a stroke such as sudden numbness or weakness of your face, arm, or leg, mostly on one side of your body; sudden confusion, trouble speaking or understanding; sudden trouble seeing in one or both eyes; sudden trouble walking, feeling dizzy, loss of balance or coordination; or sudden, bad headache with no known cause. If you have any of these symptoms for 2 minutes, call 911. Marland Kitchen Coughing up yellow, green, or bloody mucus . Feeling that your heart is beating in a fast or not normal way (palpitations) . Nausea that stops you from eating or drinking and/or is not relieved by prescribed medicines . Throwing up more than 3 times a day . Loose bowel movements (diarrhea) 4 times a day or loose bowel movements with lack of strength or a feeling of being dizzy . No bowel movement in 3 days or when you feel uncomfortable . Trouble falling or staying asleep . Pain in your abdomen that does not go away . Weight gain of 5 pounds in one week (fluid retention) .  Swelling of your legs, ankles and/or feet . Unexplained weight gain . Lasting loss of appetite or rapid weight loss of five pounds in a week . Pain that does not go away, or is not relieved by prescribed medicines . Flu-like symptoms: fever, headache, muscle and joint aches, and fatigue (low energy, feeling weak) . A new rash or itching that is not relieved by prescribed medicines . Signs of possible liver problems: dark urine, pale bowel movements, bad stomach pain, feeling very tired and weak, unusual itching, or yellowing of the eyes or skin . Signs of tumor lysis: Confusion or agitation, decreased urine, nausea/vomiting, diarrhea, muscle cramping, numbness and/or tingling, seizures. . If you think you may be pregnant or may have impregnated your partner  Reproduction Warnings . Pregnancy warning: This drug can have harmful effects on the unborn baby. Women of childbearing potential should use 2 effective methods of birth control, one of which, must be a highly effective method of birth control, beginning at least 4 weeks before treatment starts, during your cancer treatment, including dose interruptions, and for at least 4 weeks after treatment. A highly effective method of birth control includes tubal ligation, intra-uterine device (IUD), hormonal (birth control pills, injections, patch and/or implants) or a partner's vasectomy. Let your doctor know right away if you think you may be pregnant . Two negative pregnancy tests are required in women of child-bearing potential prior to starting treatment. . You will need to have routine pregnancy tests while you are taking this drug. . Men with female partners of child-bearing potential should use effective methods of birth control during your cancer treatment and for at least 4 weeks after your cancer treatment. You should always wear a condom even if you have undergone a successful vasectomy. Let your doctor know right away if you think  you may have impregnated your partner. . Breastfeeding warning: Women should not breastfeed during treatment because this drug could enter the breast milk and cause harm to a breastfeeding baby.   SELF CARE ACTIVITIES WHILE ON CHEMOTHERAPY:  Hydration Increase your fluid  intake 48 hours prior to treatment and drink at least 8 to 12 cups (64 ounces) of water/decaffeinated beverages per day after treatment. You can still have your cup of coffee or soda but these beverages do not count as part of your 8 to 12 cups that you need to drink daily. No alcohol intake.  Medications Continue taking your normal prescription medication as prescribed.  If you start any new herbal or new supplements please let us know first to make sure it is safe.  Mouth Care Have teeth cleaned professionally before starting treatment. Keep dentures and partial plates clean. Use soft toothbrush and do not use mouthwashes that contain alcohol. Biotene is a good mouthwash that is available at most pharmacies or may be ordered by calling [phone]. Use warm salt water gargles (1 teaspoon salt per 1 quart warm water) before and after meals and at bedtime. Or you may rinse with 2 tablespoons of three-percent hydrogen peroxide mixed in eight ounces of water. If you are still having problems with your mouth or sores in your mouth please call the clinic. If you need dental work, please let the doctor know before you go for your appointment so that we can coordinate the best possible time for you in regards to your chemo regimen. You need to also let your dentist know that you are actively taking chemo. We may need to do labs prior to your dental appointment.  Skin Care Always use sunscreen that has not expired and with SPF (Sun Protection Factor) of 50 or higher. Wear hats to protect your head from the sun. Remember to use sunscreen on your hands, ears, face, & feet.  Use good moisturizing lotions such as udder cream, eucerin, or  even Vaseline. Some chemotherapies can cause dry skin, color changes in your skin and nails.    . Avoid long, hot showers or baths. . Use gentle, fragrance-free soaps and laundry detergent. . Use moisturizers, preferably creams or ointments rather than lotions because the thicker consistency is better at preventing skin dehydration. Apply the cream or ointment within 15 minutes of showering. Reapply moisturizer at night, and moisturize your hands every time after you wash them.  Hair Loss (if your doctor says your hair will fall out)  . If your doctor says that your hair is likely to fall out, decide before you begin chemo whether you want to wear a wig. You may want to shop before treatment to match your hair color. . Hats, turbans, and scarves can also camouflage hair loss, although some people prefer to leave their heads uncovered. If you go bare-headed outdoors, be sure to use sunscreen on your scalp. . Cut your hair short. It eases the inconvenience of shedding lots of hair, but it also can reduce the emotional impact of watching your hair fall out. . Don't perm or color your hair during chemotherapy. Those chemical treatments are already damaging to hair and can enhance hair loss. Once your chemo treatments are done and your hair has grown back, it's OK to resume dyeing or perming hair. With chemotherapy, hair loss is almost always temporary. But when it grows back, it may be a different color or texture. In older adults who still had hair color before chemotherapy, the new growth may be completely gray.  Often, new hair is very fine and soft.  Infection Prevention Please wash your hands for at least 30 seconds using warm soapy water. Handwashing is the #1 way to prevent the spread of  germs. Stay away from sick people or people who are getting over a cold. If you develop respiratory systems such as green/yellow mucus production or productive cough or persistent cough let us know and we will see  if you need an antibiotic. It is a good idea to keep a pair of gloves on when going into grocery stores/[employer] to decrease your risk of coming into contact with germs on the carts, etc. Carry alcohol hand gel with you at all times and use it frequently if out in public. If your temperature reaches 100.5 or higher please call the clinic and let us know.  If it is after hours or on the weekend please go to the ER if your temperature is over 100.5.  Please have your own personal thermometer at home to use.    Sex and bodily fluids If you are going to have sex, a condom must be used to protect the person that isn't taking chemotherapy. Chemo can decrease your libido (sex drive). For a few days after chemotherapy, chemotherapy can be excreted through your bodily fluids.  When using the toilet please close the lid and flush the toilet twice.  Do this for a few day after you have had chemotherapy.   Effects of chemotherapy on your sex life Some changes are simple and won't last long. They won't affect your sex life permanently. Sometimes you may feel: . too tired . not strong enough to be very active . sick or sore  . not in the mood . anxious or low Your anxiety might not seem related to sex. For example, you may be worried about the cancer and how your treatment is going. Or you may be worried about money, or about how you family are coping with your illness. These things can cause stress, which can affect your interest in sex. It's important to talk to your partner about how you feel. Remember - the changes to your sex life don't usually last long. There's usually no medical reason to stop having sex during chemo. The drugs won't have any long term physical effects on your performance or enjoyment of sex. Cancer can't be passed on to your partner during sex  Contraception It's important to use reliable contraception during treatment. Avoid getting pregnant while you or your partner are having  chemotherapy. This is because the drugs may harm the baby. Sometimes chemotherapy drugs can leave a man or woman infertile.  This means you would not be able to have children in the future. You might want to talk to someone about permanent infertility. It can be very difficult to learn that you may no longer be able to have children. Some people find counselling helpful. There might be ways to preserve your fertility, although this is easier for men than for women. You may want to speak to a fertility expert. You can talk about sperm banking or harvesting your eggs. You can also ask about other fertility options, such as donor eggs. If you have or have had breast cancer, your doctor might advise you not to take the contraceptive pill. This is because the hormones in it might affect the cancer.  It is not known for sure whether or not chemotherapy drugs can be passed on through semen or secretions from the vagina. Because of this some doctors advise people to use a barrier method if you have sex during treatment. This applies to vaginal, anal or oral sex. Generally, doctors advise a barrier method only for the  time you are actually having the treatment and for about a week after your treatment. Advice like this can be worrying, but this does not mean that you have to avoid being intimate with your partner. You can still have close contact with your partner and continue to enjoy sex.  Animals If you have cats or birds we just ask that you not change the litter or change the cage.  Please have someone else do this for you while you are on chemotherapy.   Food Safety During and After Cancer Treatment Food safety is important for people both during and after cancer treatment. Cancer and cancer treatments, such as chemotherapy, radiation therapy, and stem cell/bone marrow transplantation, often weaken the immune system. This makes it harder for your body to protect itself from foodborne illness, also called  food poisoning. Foodborne illness is caused by eating food that contains harmful bacteria, parasites, or viruses.  Foods to avoid Some foods have a higher risk of becoming tainted with bacteria. These include: Marland Kitchen Unwashed fresh fruit and vegetables, especially leafy vegetables that can hide dirt and other contaminants . Raw sprouts, such as alfalfa sprouts . Raw or undercooked beef, especially ground beef, or other raw or undercooked meat and poultry . Fatty, fried, or spicy foods immediately before or after treatment.  These can sit heavy on your stomach and make you feel nauseous. . Raw or undercooked shellfish, such as oysters. . Sushi and sashimi, which often contain raw fish.  . Unpasteurized beverages, such as unpasteurized fruit juices, raw milk, raw yogurt, or cider . Undercooked eggs, such as soft boiled, over easy, and poached; raw, unpasteurized eggs; or foods made with raw egg, such as homemade raw cookie dough and homemade mayonnaise Simple steps for food safety Shop smart. . Do not buy food stored or displayed in an unclean area. . Do not buy bruised or damaged fruits or vegetables. . Do not buy cans that have cracks, dents, or bulges. . Pick up foods that can spoil at the end of your shopping trip and store them in a cooler on the way home. Prepare and clean up foods carefully. . Rinse all fresh fruits and vegetables under running water, and dry them with a clean towel or paper towel. . Clean the top of cans before opening them. . After preparing food, wash your hands for 20 seconds with hot water and soap. Pay special attention to areas between fingers and under nails. . Clean your utensils and dishes with hot water and soap. Marland Kitchen Disinfect your kitchen and cutting boards using 1 teaspoon of liquid, unscented bleach mixed into 1 quart of water.   Dispose of old food. . Eat canned and packaged food before its expiration date (the "use by" or "best before" date). . Consume  refrigerated leftovers within 3 to 4 days. After that time, throw out the food. Even if the food does not smell or look spoiled, it still may be unsafe. Some bacteria, such as Listeria, can grow even on foods stored in the refrigerator if they are kept for too long. Take precautions when eating out. . At restaurants, avoid buffets and salad bars where food sits out for a long time and comes in contact with many people. Food can become contaminated when someone with a virus, often a norovirus, or another "bug" handles it. . Put any leftover food in a "to-go" container yourself, rather than having the server do it. And, refrigerate leftovers as soon as you get home. Marland Kitchen  Choose restaurants that are clean and that are willing to prepare your food as you order it cooked.   MEDICATIONS:                                                                                                                                                                Compazine/Prochlorperazine 63m tablet. Take 1 tablet every 6 hours as needed for nausea/vomiting. (This can make you sleepy)   EMLA cream. Apply a quarter size amount to port site 1 hour prior to chemo. Do not rub in. Cover with plastic wrap.   Over-the-Counter Meds:  Colace - 100 mg capsules - take 2 capsules daily.  If this doesn't help then you can increase to 2 capsules twice daily.  Call [place] this does not help your bowels move.   Imodium 229mcapsule. Take 2 capsules after the 1st loose stool and then 1 capsule every 2 hours until you go a total of 12 hours without having a loose stool. Call the [facility] loose stools continue. If diarrhea occurs at bedtime, take 2 capsules at bedtime. Then take 2 capsules every 4 hours until morning. Call [facility]   Diarrhea Sheet   If you are having loose stools/diarrhea, please purchase Imodium and begin taking as outlined:  At the first sign of poorly formed or loose stools you should begin taking  Imodium (loperamide) 2 mg capsules.  Take two caplets (27m96mfollowed by one caplet (2mg69mvery 2 hours until you have had no diarrhea for 12 hours.  During the night take two caplets (27mg)40m bedtime and continue every 4 hours during the night until the morning.  Stop taking Imodium only after there is no sign of diarrhea for 12 hours.    Always call the [place] are having loose stools/diarrhea that you can't get under control.  Loose stools/diarrhea leads to dehydration (loss of water) in your body.  We have other options of trying to get the loose stools/diarrhea to stop but you must let us knKorea!   Constipation Sheet  Colace - 100 mg capsules - take 2 capsules daily.  If this doesn't help then you can increase to 2 capsules twice daily.  Please call if the above does not work for you.   Do not go more than 2 days without a bowel movement.  It is very important that you do not become constipated.  It will make you feel sick to your stomach (nausea) and can cause abdominal pain and vomiting.   Nausea Sheet   Compazine/Prochlorperazine 10mg 38met. Take 1 tablet every 6 hours as needed for nausea/vomiting. (This can make you sleepy)  If you are having persistent nausea (nausea that does not stop) please call the [facility] and let us know the amount of nausea that you are experiencing.  If you begin to vomit, you need to call the [place] and if it is the weekend and you have vomited more than one time and can't get it to stop-go to the Emergency Room.  Persistent nausea/vomiting can lead to dehydration (loss of fluid in your body) and will make you feel terrible.   Ice chips, sips of clear liquids, foods that are @ room temperature, crackers, and toast tend to be better tolerated.   SYMPTOMS TO REPORT AS SOON AS POSSIBLE AFTER TREATMENT:   FEVER GREATER THAN 100.5 F  CHILLS WITH OR WITHOUT FEVER  NAUSEA AND VOMITING THAT IS NOT CONTROLLED WITH YOUR NAUSEA  MEDICATION  UNUSUAL SHORTNESS OF BREATH  UNUSUAL BRUISING OR BLEEDING  TENDERNESS IN MOUTH AND THROAT WITH OR WITHOUT PRESENCE OF ULCERS  URINARY PROBLEMS  BOWEL PROBLEMS  UNUSUAL RASH      Wear comfortable clothing and clothing appropriate for easy access to any Portacath or PICC line. Let us know if there is anything that we can do to make your therapy better!    What to do if you need assistance after hours or on the weekends: CALL [phone].  HOLD on the line, do not hang up.  You will hear multiple messages but at the end you will be connected with a nurse triage line.  They will contact the doctor if necessary.  Most of the time they will be able to assist you.  Do not call the hospital operator.      I have been informed and understand all of the instructions given to me and have received a copy. I have been instructed to call the clinic [phone] or my family physician as soon as possible for continued medical care, if indicated. I do not have any more questions at this time but understand that I may call the [place] or the Patient Navigator at [phone] during office hours should I have questions or need assistance in obtaining follow-up care.

## 2018-06-09 NOTE — Progress Notes (Signed)
Chemotherapy teaching packet pulled together. 

## 2018-06-10 ENCOUNTER — Other Ambulatory Visit (HOSPITAL_COMMUNITY): Payer: Self-pay | Admitting: Hematology

## 2018-06-10 DIAGNOSIS — M1712 Unilateral primary osteoarthritis, left knee: Secondary | ICD-10-CM | POA: Diagnosis not present

## 2018-06-10 DIAGNOSIS — M1711 Unilateral primary osteoarthritis, right knee: Secondary | ICD-10-CM | POA: Diagnosis not present

## 2018-06-11 ENCOUNTER — Inpatient Hospital Stay (HOSPITAL_COMMUNITY): Payer: BLUE CROSS/BLUE SHIELD

## 2018-06-11 ENCOUNTER — Encounter (HOSPITAL_COMMUNITY): Payer: Self-pay | Admitting: Hematology

## 2018-06-11 ENCOUNTER — Other Ambulatory Visit: Payer: Self-pay

## 2018-06-11 ENCOUNTER — Inpatient Hospital Stay (HOSPITAL_BASED_OUTPATIENT_CLINIC_OR_DEPARTMENT_OTHER): Payer: BLUE CROSS/BLUE SHIELD | Admitting: Hematology

## 2018-06-11 VITALS — BP 127/90 | HR 79 | Temp 98.2°F | Resp 18 | Wt 266.5 lb

## 2018-06-11 DIAGNOSIS — C9000 Multiple myeloma not having achieved remission: Secondary | ICD-10-CM

## 2018-06-11 DIAGNOSIS — Z87891 Personal history of nicotine dependence: Secondary | ICD-10-CM

## 2018-06-11 DIAGNOSIS — Z5112 Encounter for antineoplastic immunotherapy: Secondary | ICD-10-CM

## 2018-06-11 DIAGNOSIS — G629 Polyneuropathy, unspecified: Secondary | ICD-10-CM

## 2018-06-11 DIAGNOSIS — E119 Type 2 diabetes mellitus without complications: Secondary | ICD-10-CM

## 2018-06-11 DIAGNOSIS — Z5111 Encounter for antineoplastic chemotherapy: Secondary | ICD-10-CM | POA: Diagnosis not present

## 2018-06-11 DIAGNOSIS — M545 Low back pain: Secondary | ICD-10-CM

## 2018-06-11 LAB — CBC WITH DIFFERENTIAL/PLATELET
Abs Immature Granulocytes: 0.03 10*3/uL (ref 0.00–0.07)
BASOS ABS: 0 10*3/uL (ref 0.0–0.1)
BASOS PCT: 1 %
EOS PCT: 6 %
Eosinophils Absolute: 0.4 10*3/uL (ref 0.0–0.5)
HCT: 34 % — ABNORMAL LOW (ref 36.0–46.0)
HEMOGLOBIN: 10.9 g/dL — AB (ref 12.0–15.0)
Immature Granulocytes: 1 %
LYMPHS PCT: 20 %
Lymphs Abs: 1.3 10*3/uL (ref 0.7–4.0)
MCH: 31.4 pg (ref 26.0–34.0)
MCHC: 32.1 g/dL (ref 30.0–36.0)
MCV: 98 fL (ref 80.0–100.0)
MONO ABS: 0.6 10*3/uL (ref 0.1–1.0)
Monocytes Relative: 9 %
NRBC: 0 % (ref 0.0–0.2)
Neutro Abs: 4.1 10*3/uL (ref 1.7–7.7)
Neutrophils Relative %: 63 %
PLATELETS: 243 10*3/uL (ref 150–400)
RBC: 3.47 MIL/uL — AB (ref 3.87–5.11)
RDW: 14.6 % (ref 11.5–15.5)
WBC: 6.4 10*3/uL (ref 4.0–10.5)

## 2018-06-11 LAB — COMPREHENSIVE METABOLIC PANEL
ALBUMIN: 4.2 g/dL (ref 3.5–5.0)
ALT: 22 U/L (ref 0–44)
ANION GAP: 10 (ref 5–15)
AST: 40 U/L (ref 15–41)
Alkaline Phosphatase: 75 U/L (ref 38–126)
BUN: 26 mg/dL — ABNORMAL HIGH (ref 6–20)
CALCIUM: 9.7 mg/dL (ref 8.9–10.3)
CO2: 23 mmol/L (ref 22–32)
Chloride: 105 mmol/L (ref 98–111)
Creatinine, Ser: 1.35 mg/dL — ABNORMAL HIGH (ref 0.44–1.00)
GFR calc non Af Amer: 45 mL/min — ABNORMAL LOW (ref >60)
GFR, EST AFRICAN AMERICAN: 52 mL/min — AB (ref >60)
GLUCOSE: 155 mg/dL — AB (ref 70–99)
Potassium: 3.9 mmol/L (ref 3.5–5.1)
SODIUM: 138 mmol/L (ref 135–145)
Total Bilirubin: 0.4 mg/dL (ref 0.3–1.2)
Total Protein: 7.4 g/dL (ref 6.5–8.1)

## 2018-06-11 LAB — MAGNESIUM: Magnesium: 2.1 mg/dL (ref 1.7–2.4)

## 2018-06-11 MED ORDER — PROCHLORPERAZINE MALEATE 10 MG PO TABS: 10.0000 mg | ORAL_TABLET | Freq: Once | ORAL | Status: DC

## 2018-06-11 MED ORDER — MAGNESIUM OXIDE 400 (241.3 MG) MG PO TABS: 400.0000 mg | ORAL_TABLET | Freq: Two times a day (BID) | ORAL | 1 refills | Status: DC

## 2018-06-11 MED ORDER — BORTEZOMIB CHEMO SQ INJECTION 3.5 MG (2.5MG/ML)
1.3000 mg/m2 | Freq: Once | INTRAMUSCULAR | Status: AC
  Administered 2018-06-11: 3 mg via SUBCUTANEOUS
  Filled 2018-06-11: qty 1.2

## 2018-06-11 NOTE — Progress Notes (Signed)
Joanna Reid presents today for injection per the provider's orders.  Velcade administration without incident; see MAR for injection details.  Patient tolerated procedure well and without incident.  No questions or complaints noted at this time.  Discharged ambulatory in c/o spouse.

## 2018-06-11 NOTE — Progress Notes (Signed)
Joanna Reid, [place] [address]   CLINIC:  Medical Oncology/Hematology  PCP:  Mikey Kirschner, [place] [address] [phone]   REASON FOR VISIT: Follow-up for multiple myeloma  CURRENT THERAPY: valcade, revlimid, dexamethasone  BRIEF ONCOLOGIC HISTORY:    Multiple myeloma ([place])   10/29/2017 Initial Diagnosis    Multiple myeloma ([place])    11/01/2017 - 01/24/2018 Chemotherapy    The patient had bortezomib SQ (VELCADE) chemo injection 3 mg, 1.3 mg/m2 = 3 mg, Subcutaneous,  Once, 5 of 5 cycles Administration: 3 mg (11/01/2017), 3 mg (11/08/2017), 3 mg (11/05/2017), 3 mg (11/22/2017), 3 mg (11/12/2017), 3 mg (11/29/2017), 3 mg (11/26/2017), 3 mg (12/03/2017), 3 mg (12/13/2017), 3 mg (12/20/2017), 3 mg (12/17/2017), 3 mg (12/24/2017), 3 mg (01/03/2018), 3 mg (01/10/2018), 3 mg (01/07/2018), 3 mg (01/14/2018), 3 mg (01/24/2018)  for chemotherapy treatment.     05/30/2018 -  Chemotherapy    The patient had bortezomib SQ (VELCADE) chemo injection 3 mg, 1.3 mg/m2 = 3 mg, Subcutaneous,  Once, 1 of 14 cycles  for chemotherapy treatment.       CANCER STAGING: Cancer Staging Multiple myeloma ([place]) Staging form: Plasma Cell Myeloma and Plasma Cell Disorders, AJCC 8th Edition - Clinical: No stage assigned - Unsigned    INTERVAL HISTORY:  Joanna Reid 52 y.o. female returns for routine follow-up for multiple myeloma. She is here today with her husband. She is doing great she has no complaints except for some fatigue. She denies any GI issues. Denies any numbness or tingling. Denies any bleeding or easy bruising. She reports her appetite and energy level at 75%. She is trying to remain active at home.       REVIEW OF SYSTEMS:  Review of Systems  All other systems reviewed and are negative.    PAST MEDICAL/SURGICAL HISTORY:  Past Medical History:  Diagnosis Date  . Acid reflux   . Allergic rhinitis   . Diabetes mellitus    type 2  .  Gout   . Gout   . HBP (high blood pressure)   . History of kidney stones   . Migraines    Past Surgical History:  Procedure Laterality Date  . CESAREAN SECTION    . EXTRACORPOREAL SHOCK WAVE LITHOTRIPSY Left 10/10/2017   Procedure: LEFT EXTRACORPOREAL SHOCK WAVE LITHOTRIPSY (ESWL);  Surgeon: Bjorn Loser, MD;  Location: [facility];  Service: Urology;  Laterality: Left;  . EYE SURGERY    . HEMORRHOID SURGERY N/A 11/19/2012   Procedure: HEMORRHOIDECTOMY;  Surgeon: Jamesetta So, MD;  Location: [facility];  Service: General;  Laterality: N/A;  . kidney stones  1998  . LAPAROSCOPIC UNILATERAL SALPINGO OOPHERECTOMY  05/14/2012   Procedure: LAPAROSCOPIC UNILATERAL SALPINGO OOPHORECTOMY;  Surgeon: [name], MD;  Location: [facility];  Service: Gynecology;  Laterality: Right;  laparoscopic right salpingo-oophorectomy  . PARTIAL HYSTERECTOMY    . TONSILECTOMY, ADENOIDECTOMY, BILATERAL MYRINGOTOMY AND TUBES    . VESICOVAGINAL FISTULA CLOSURE W/ TAH       SOCIAL HISTORY:  Social History   Socioeconomic History  . Marital status: Married    Spouse name: Not on file  . Number of children: Not on file  . Years of education: 12  . Highest education level: Not on file  Occupational History    Employer: [employer]  . Financial resource strain: Not on file  . Food insecurity:    Worry: Never true  Inability: Never true  . Transportation needs:    Medical: Not on file    Non-medical: Not on file  Tobacco Use  . Smoking status: Former Smoker    Last attempt to quit: 02/27/1999    Years since quitting: 19.2  . Smokeless tobacco: Never Used  . Tobacco comment: socially   Substance and Sexual Activity  . Alcohol use: No    Alcohol/week: 0.0 standard drinks  . Drug use: No  . Sexual activity: Yes  Lifestyle  . Physical activity:    Days per week: Not on file    Minutes per session: Not on file  . Stress: Not on file  Relationships  . Social connections:    Talks on phone: Not  on file    Gets together: Not on file    Attends religious service: Not on file    Active member of club or organization: Not on file    Attends meetings of clubs or organizations: Not on file    Relationship status: Not on file  . Intimate partner violence:    Fear of current or ex partner: Not on file    Emotionally abused: Not on file    Physically abused: Not on file    Forced sexual activity: Not on file  Other Topics Concern  . Not on file  Social History Narrative  . Not on file    FAMILY HISTORY:  Family History  Problem Relation Age of Onset  . Arthritis Unknown   . Cancer Unknown   . Diabetes Unknown   . Hypertension Mother   . Dementia Mother   . Diabetes Father   . ALS Father   . Diabetes Brother   . Hypertension Brother   . Cancer Paternal Aunt   . COPD Maternal Grandmother   . Cancer Maternal Grandfather   . Anesthesia problems Paternal Grandfather     CURRENT MEDICATIONS:  Outpatient Encounter Medications as of 06/11/2018  Medication Sig Note  . acyclovir (ZOVIRAX) 800 MG tablet    . albuterol (PROVENTIL HFA;VENTOLIN HFA) 108 (90 Base) MCG/ACT inhaler Inhale 1-2 puffs into the lungs daily as needed.   Marland Kitchen allopurinol (ZYLOPRIM) 300 MG tablet TAKE 1 TABLET BY MOUTH  DAILY FOR GOUT   . Bortezomib (VELCADE IJ) Inject as directed.   . bumetanide (BUMEX) 1 MG tablet Take 1 tablet (1 mg total) by mouth daily.   . Calcium Carb-Cholecalciferol (CALCIUM 600+D3 PO) Take by mouth. 11/08/2017: Calcium 672m with Vitamin D 1,000 units BID  . carvedilol (COREG) 6.25 MG tablet Take by mouth.   . dexamethasone (DECADRON) 4 MG tablet Take 10 tablets (40 mg total) by mouth once a week.   . diazepam (VALIUM) 5 MG tablet TAKE 1 TABLET BY MOUTH AT BEDTIME AS NEEDED FOR ANXIETY OR INSOMNIA   . docusate sodium (COLACE) 100 MG capsule Take by mouth.   . fluticasone (FLONASE) 50 MCG/ACT nasal spray Place 2 sprays into the nose 2 (two) times daily.   .Marland Kitchenglucose blood (PRECISION QID  TEST) test strip Use 1 each (1 strip total) 4 (four) times daily Use as instructed. OneTouch Verio   . HYDROcodone-acetaminophen (NORCO/VICODIN) 5-325 MG tablet Take 1 tablet by mouth every 6 (six) hours as needed for moderate pain.   .Marland Kitcheninsulin glargine (LANTUS) 100 UNIT/ML injection Inject 7 Units into the skin at bedtime.   . Lancets Misc. (UNISTIK 2 NORMAL) MISC Use 1 each 4 (four) times daily Use as instructed.   .Marland Kitchenlenalidomide (  REVLIMID) 25 MG capsule Take 1 capsule (25 mg total) by mouth daily. 2 weeks on, one-week off   . losartan (COZAAR) 100 MG tablet TAKE 1 TABLET BY MOUTH  DAILY   . magnesium oxide (MAG-OX) 400 MG tablet Take by mouth.   . metFORMIN (GLUCOPHAGE) 500 MG tablet TAKE 1 TABLET BY MOUTH ONCE DAILY WITH BREAKFAST (Patient taking differently: 1,000 mg. TAKE 2 TABLETS BY MOUTH IN THE MORNING AND AT NIGHT)   . omeprazole (PRILOSEC) 20 MG capsule Take 1 capsule (20 mg total) by mouth daily.   . ondansetron (ZOFRAN) 8 MG tablet Take 1 tablet (8 mg total) by mouth every 8 (eight) hours as needed.   Glory Rosebush VERIO test strip USE 1 STRIP TO CHECK GLUCOSE 4 TIMES DAILY AS DIRECTED   . potassium chloride SA (K-DUR,KLOR-CON) 20 MEQ tablet    . pregabalin (LYRICA) 200 MG capsule Take 1 capsule (200 mg total) by mouth 2 (two) times daily.   . prochlorperazine (COMPAZINE) 10 MG tablet Take 1 tablet (10 mg total) by mouth every 6 (six) hours as needed for nausea or vomiting.   . rosuvastatin (CRESTOR) 5 MG tablet    . TRULICITY 1.76 HY/0.7PX SOPN    . verapamil (CALAN-SR) 240 MG CR tablet Take 1 tablet (240 mg total) by mouth at bedtime.   . magnesium oxide (MAG-OX) 400 (241.3 Mg) MG tablet Take 1 tablet (400 mg total) by mouth 2 (two) times daily.   . [DISCONTINUED] HUMALOG KWIKPEN 100 UNIT/ML KiwkPen INJECT THREE TIMES DAILY BEFORE MEALS PER CORRECTION SCALE. MAX DOSE PER DAY IS 35 UNITS   . [DISCONTINUED] lenalidomide (REVLIMID) 10 MG capsule Take 1 capsule (10 mg total) by mouth  daily. Take for 21 days on, then hold for 7 days. Repeat every 28 days. (Patient not taking: Reported on 06/09/2018)   . [DISCONTINUED] metFORMIN (GLUCOPHAGE-XR) 500 MG 24 hr tablet    . [DISCONTINUED] Vitamin D, Ergocalciferol, (DRISDOL) 50000 units CAPS capsule     No facility-administered encounter medications on file as of 06/11/2018.     ALLERGIES:  No Known Allergies   PHYSICAL EXAM:  ECOG Performance status: 1  Vitals:   06/11/18 0937  BP: 127/90  Pulse: 79  Resp: 18  Temp: 98.2 F (36.8 C)  SpO2: 98%   Filed Weights   06/11/18 0937  Weight: 266 lb 8 oz (120.9 kg)    Physical Exam  Constitutional: She is oriented to person, place, and time. She appears well-developed and well-nourished.  Cardiovascular: Normal rate, regular rhythm and normal heart sounds.  Pulmonary/Chest: Effort normal and breath sounds normal.  Musculoskeletal: Normal range of motion.  Neurological: She is alert and oriented to person, place, and time.  Skin: Skin is warm and dry.  Psychiatric: She has a normal mood and affect. Her behavior is normal. Judgment and thought content normal.     LABORATORY DATA:  I have reviewed the labs as listed.  CBC    Component Value Date/Time   WBC 6.4 06/11/2018 0828   RBC 3.47 (L) 06/11/2018 0828   HGB 10.9 (L) 06/11/2018 0828   HGB 14.2 08/02/2015 1113   HCT 34.0 (L) 06/11/2018 0828   HCT 41.1 08/02/2015 1113   PLT 243 06/11/2018 0828   PLT 371 08/02/2015 1113   MCV 98.0 06/11/2018 0828   MCV 84 08/02/2015 1113   MCH 31.4 06/11/2018 0828   MCHC 32.1 06/11/2018 0828   RDW 14.6 06/11/2018 0828   RDW 15.1 08/02/2015 1113  LYMPHSABS 1.3 06/11/2018 0828   LYMPHSABS 2.9 08/02/2015 1113   MONOABS 0.6 06/11/2018 0828   EOSABS 0.4 06/11/2018 0828   EOSABS 0.2 08/02/2015 1113   BASOSABS 0.0 06/11/2018 0828   BASOSABS 0.1 08/02/2015 1113   CMP Latest Ref Rng & Units 06/11/2018 05/20/2018 05/01/2018  Glucose 70 - 99 mg/dL 155(H) 127(H) 123(H)  BUN 6  - 20 mg/dL 26(H) 19 19  Creatinine 0.44 - 1.00 mg/dL 1.35(H) 1.07(H) 1.13(H)  Sodium 135 - 145 mmol/L 138 140 140  Potassium 3.5 - 5.1 mmol/L 3.9 3.9 3.6  Chloride 98 - 111 mmol/L 105 106 107  CO2 22 - 32 mmol/L 23 25 23   Calcium 8.9 - 10.3 mg/dL 9.7 9.3 9.6  Total Protein 6.5 - 8.1 g/dL 7.4 7.1 7.1  Total Bilirubin 0.3 - 1.2 mg/dL 0.4 0.8 0.8  Alkaline Phos 38 - 126 U/L 75 75 57  AST 15 - 41 U/L 40 41 54(H)  ALT 0 - 44 U/L 22 21 32     I have reviewed Francene Finders, NP's note and agree with the documentation.  I personally performed a face-to-face visit, made revisions and my assessment and plan is as follows.     ASSESSMENT & PLAN:   Multiple myeloma ([place]) 1.  IgG lambda multiple myeloma, stage III, del 17p: - Presentation with lower back pain since March, presented to the emergency room on 10/25/2017 with right groin pain, CT scan showed multiple lytic lesions, multiple lytic lesions on skeletal survey - SPEP shows 1.5 g/dL of monoclonal protein, beta-2 microglobulin of 4.8, free lambda light chains of 2869, kappa by lambda ratio of less than 0, elevated LDH. 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  FISH panel shows -14, -12, 13 q.-/-13 and 17p-.  Bone marrow biopsy shows 90% plasma cells. -4 cycles of RVD from 11/01/2017 through 01/14/2018.  Labs done on 01/20/2018 showed M spike to be negative. - She was evaluated by Dr. Laverta Baltimore at [facility].  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  She was discharged home on 03/07/2018.  Her energy levels are improving. - She was restarted back on Zometa on 04/22/2018. -Most recent blood work from [facility] shows negative SPEP, immunofixation and normal free light chain and ratio. - She was started back on Zometa on  04/22/2018.  Last Zometa was on 05/12/2018. - Today's day 111 of transplant.  I have talked to Dr. Alvie Heidelberg at [facility].  She has recommended 2 cycles of consolidation chemotherapy followed by maintenance. - We will give her 2 cycles of full dose Revlimid 2 weeks on 1 week off, weekly Velcade every 21 days. - She will start her cycle 1 day 1 of Velcade today.  She will start Revlimid tonight.  She will also start her on dexamethasone 40 mg weekly. -I have reviewed her blood work today.  Creatinine is slightly elevated at 1.35.  She is taking Bumex 2 mg daily.  She will cut it down to 1 mg alternating with 2 mg. - We will see her back in 3 weeks for follow-up.  2.  Low back pain: Her low back pain completely improved after starting myeloma therapy.  She is not taking any pain medicine.  3.  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 -  Symptoms are well controlled on Lyrica 200 mg twice daily.  4.  ID prophylaxis:She will continue acyclovir twice daily.  5.  Hypomagnesemia: -She is taking 2 magnesium pills twice a day, over-the-counter preparation.  We will prescribe her magnesium oxide 400 mEq twice a day.  6.  Diabetes: - She is continuing metformin thousand milligrams twice daily. -She was recently started on Trulicity and Lantus 7 units at bedtime.      Orders placed this encounter:  Orders Placed This Encounter  Procedures  . Magnesium      Derek Jack, MD [place] [phone]

## 2018-06-11 NOTE — Patient Instructions (Signed)
[facility] at [facility] Discharge Instructions     Thank you for choosing [facility] at [facility] to provide your oncology and hematology care.  To afford each patient quality time with our provider, please arrive at least 15 minutes before your scheduled appointment time.   If you have a lab appointment with the Cancer Center please come in thru the  Main Entrance and check in at the main information desk  You need to re-schedule your appointment should you arrive 10 or more minutes late.  We strive to give you quality time with our providers, and arriving late affects you and other patients whose appointments are after yours.  Also, if you no show three or more times for appointments you may be dismissed from the clinic at the providers discretion.     Again, thank you for choosing [facility].  Our hope is that these requests will decrease the amount of time that you wait before being seen by our physicians.       _____________________________________________________________  Should you have questions after your visit to [facility], please contact our office at [phone] between the hours of 8:00 a.m. and 4:30 p.m.  Voicemails left after 4:00 p.m. will not be returned until the following business day.  For prescription refill requests, have your pharmacy contact our office and allow 72 hours.    Cancer Center Support Programs:   > Cancer Support Group  2nd Tuesday of the month 1pm-2pm, Journey Room    

## 2018-06-11 NOTE — Assessment & Plan Note (Signed)
1.  IgG lambda multiple myeloma, stage III, del 17p: - Presentation with lower back pain since March, presented to the emergency room on 10/25/2017 with right groin pain, CT scan showed multiple lytic lesions, multiple lytic lesions on skeletal survey - SPEP shows 1.5 g/dL of monoclonal protein, beta-2 microglobulin of 4.8, free lambda light chains of 2869, kappa by lambda ratio of less than 0, elevated LDH. 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  FISH panel shows -14, -12, 13 q.-/-13 and 17p-.  Bone marrow biopsy shows 90% plasma cells. -4 cycles of RVD from 11/01/2017 through 01/14/2018.  Labs done on 01/20/2018 showed M spike to be negative. - She was evaluated by Dr. Laverta Baltimore at [facility]  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  She was discharged home on 03/07/2018.  Her energy levels are improving. - She was restarted back on Zometa on 04/22/2018. -Most recent blood work from [facility] shows negative SPEP, immunofixation and normal free light chain and ratio. - She was started back on Zometa on 04/22/2018.  Last Zometa was on 05/12/2018. - Today's day 111 of transplant.  I have talked to Dr. Alvie Heidelberg at [facility].  She has recommended 2 cycles of consolidation chemotherapy followed by maintenance. - We will give her 2 cycles of full dose Revlimid 2 weeks on 1 week off, weekly Velcade every 21 days. - She will start her cycle 1 day 1 of Velcade today.  She will start Revlimid tonight.  She will also start her on dexamethasone 40 mg weekly. -I have reviewed her blood work today.  Creatinine is slightly elevated at 1.35.  She is taking Bumex 2 mg daily.  She will cut it down to 1 mg alternating with 2 mg. - We will see her back in 3 weeks for follow-up.  2.  Low  back pain: Her low back pain completely improved after starting myeloma therapy.  She is not taking any pain medicine.  3.  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 -Symptoms are well controlled on Lyrica 200 mg twice daily.  4.  ID prophylaxis:She will continue acyclovir twice daily.  5.  Hypomagnesemia: -She is taking 2 magnesium pills twice a day, over-the-counter preparation.  We will prescribe her magnesium oxide 400 mEq twice a day.  6.  Diabetes: - She is continuing metformin thousand milligrams twice daily. -She was recently started on Trulicity and Lantus 7 units at bedtime.

## 2018-06-16 ENCOUNTER — Other Ambulatory Visit (HOSPITAL_COMMUNITY): Payer: Self-pay | Admitting: *Deleted

## 2018-06-16 DIAGNOSIS — C9000 Multiple myeloma not having achieved remission: Secondary | ICD-10-CM

## 2018-06-16 MED ORDER — LENALIDOMIDE 25 MG PO CAPS: 25.0000 mg | ORAL_CAPSULE | Freq: Every day | ORAL | 0 refills | Status: DC

## 2018-06-17 ENCOUNTER — Other Ambulatory Visit (HOSPITAL_COMMUNITY): Payer: Self-pay

## 2018-06-17 ENCOUNTER — Ambulatory Visit (HOSPITAL_COMMUNITY): Payer: Self-pay

## 2018-06-17 DIAGNOSIS — M1711 Unilateral primary osteoarthritis, right knee: Secondary | ICD-10-CM | POA: Diagnosis not present

## 2018-06-17 DIAGNOSIS — M1712 Unilateral primary osteoarthritis, left knee: Secondary | ICD-10-CM | POA: Diagnosis not present

## 2018-06-17 DIAGNOSIS — C9000 Multiple myeloma not having achieved remission: Secondary | ICD-10-CM | POA: Diagnosis not present

## 2018-06-18 ENCOUNTER — Other Ambulatory Visit: Payer: Self-pay

## 2018-06-18 ENCOUNTER — Inpatient Hospital Stay (HOSPITAL_COMMUNITY): Payer: BLUE CROSS/BLUE SHIELD

## 2018-06-18 ENCOUNTER — Other Ambulatory Visit (HOSPITAL_COMMUNITY): Payer: Self-pay | Admitting: Hematology

## 2018-06-18 ENCOUNTER — Inpatient Hospital Stay (HOSPITAL_COMMUNITY): Payer: BLUE CROSS/BLUE SHIELD | Attending: Hematology

## 2018-06-18 VITALS — BP 122/77 | HR 81 | Temp 97.8°F | Resp 18 | Wt 264.2 lb

## 2018-06-18 DIAGNOSIS — Z5111 Encounter for antineoplastic chemotherapy: Secondary | ICD-10-CM | POA: Insufficient documentation

## 2018-06-18 DIAGNOSIS — C9000 Multiple myeloma not having achieved remission: Secondary | ICD-10-CM

## 2018-06-18 LAB — COMPREHENSIVE METABOLIC PANEL
ALBUMIN: 3.8 g/dL (ref 3.5–5.0)
ALT: 26 U/L (ref 0–44)
AST: 29 U/L (ref 15–41)
Alkaline Phosphatase: 61 U/L (ref 38–126)
Anion gap: 9 (ref 5–15)
BUN: 19 mg/dL (ref 6–20)
CO2: 23 mmol/L (ref 22–32)
Calcium: 9.2 mg/dL (ref 8.9–10.3)
Chloride: 106 mmol/L (ref 98–111)
Creatinine, Ser: 1.05 mg/dL — ABNORMAL HIGH (ref 0.44–1.00)
GFR calc Af Amer: >60 mL/min (ref >60)
GFR calc non Af Amer: >60 mL/min (ref >60)
Glucose, Bld: 107 mg/dL — ABNORMAL HIGH (ref 70–99)
Potassium: 3.5 mmol/L (ref 3.5–5.1)
Sodium: 138 mmol/L (ref 135–145)
Total Bilirubin: 0.5 mg/dL (ref 0.3–1.2)
Total Protein: 6.5 g/dL (ref 6.5–8.1)

## 2018-06-18 LAB — CBC WITH DIFFERENTIAL/PLATELET
Abs Immature Granulocytes: 0.17 10*3/uL — ABNORMAL HIGH (ref 0.00–0.07)
Basophils Absolute: 0 10*3/uL (ref 0.0–0.1)
Basophils Relative: 1 %
Eosinophils Absolute: 0.4 10*3/uL (ref 0.0–0.5)
Eosinophils Relative: 8 %
HCT: 31.4 % — ABNORMAL LOW (ref 36.0–46.0)
Hemoglobin: 10.3 g/dL — ABNORMAL LOW (ref 12.0–15.0)
Immature Granulocytes: 3 %
Lymphocytes Relative: 16 %
Lymphs Abs: 0.8 10*3/uL (ref 0.7–4.0)
MCH: 31.7 pg (ref 26.0–34.0)
MCHC: 32.8 g/dL (ref 30.0–36.0)
MCV: 96.6 fL (ref 80.0–100.0)
Monocytes Absolute: 0.3 10*3/uL (ref 0.1–1.0)
Monocytes Relative: 7 %
Neutro Abs: 3.4 10*3/uL (ref 1.7–7.7)
Neutrophils Relative %: 65 %
Platelets: 189 10*3/uL (ref 150–400)
RBC: 3.25 MIL/uL — ABNORMAL LOW (ref 3.87–5.11)
RDW: 14.6 % (ref 11.5–15.5)
WBC: 5.2 10*3/uL (ref 4.0–10.5)
nRBC: 0 % (ref 0.0–0.2)

## 2018-06-18 LAB — MAGNESIUM: Magnesium: 1.8 mg/dL (ref 1.7–2.4)

## 2018-06-18 LAB — LACTATE DEHYDROGENASE: LDH: 113 U/L (ref 98–192)

## 2018-06-18 MED ORDER — PROCHLORPERAZINE MALEATE 10 MG PO TABS: 10.0000 mg | ORAL_TABLET | Freq: Once | ORAL | Status: DC

## 2018-06-18 MED ORDER — BORTEZOMIB CHEMO SQ INJECTION 3.5 MG (2.5MG/ML)
1.3000 mg/m2 | Freq: Once | INTRAMUSCULAR | Status: AC
  Administered 2018-06-18: 3 mg via SUBCUTANEOUS
  Filled 2018-06-18: qty 1.2

## 2018-06-18 NOTE — Progress Notes (Signed)
Joanna Reid presents today for injection per the provider's orders.  Velcade administration without incident; see MAR for injection details.  Patient tolerated procedure well and without incident.  No questions or complaints noted at this time.  Discharged ambulatory in c/o family.

## 2018-06-23 ENCOUNTER — Other Ambulatory Visit (HOSPITAL_COMMUNITY): Payer: Self-pay | Admitting: Hematology

## 2018-06-25 ENCOUNTER — Inpatient Hospital Stay (HOSPITAL_COMMUNITY): Payer: BLUE CROSS/BLUE SHIELD

## 2018-06-25 ENCOUNTER — Encounter (HOSPITAL_COMMUNITY): Payer: Self-pay

## 2018-06-25 VITALS — BP 118/78 | HR 71 | Temp 97.8°F | Resp 18 | Wt 261.7 lb

## 2018-06-25 DIAGNOSIS — T451X5A Adverse effect of antineoplastic and immunosuppressive drugs, initial encounter: Secondary | ICD-10-CM

## 2018-06-25 DIAGNOSIS — G620 Drug-induced polyneuropathy: Secondary | ICD-10-CM

## 2018-06-25 DIAGNOSIS — C9000 Multiple myeloma not having achieved remission: Secondary | ICD-10-CM

## 2018-06-25 DIAGNOSIS — Z5111 Encounter for antineoplastic chemotherapy: Secondary | ICD-10-CM | POA: Diagnosis not present

## 2018-06-25 LAB — COMPREHENSIVE METABOLIC PANEL
ALK PHOS: 58 U/L (ref 38–126)
ALT: 26 U/L (ref 0–44)
AST: 23 U/L (ref 15–41)
Albumin: 4 g/dL (ref 3.5–5.0)
Anion gap: 9 (ref 5–15)
BUN: 17 mg/dL (ref 6–20)
CO2: 26 mmol/L (ref 22–32)
CREATININE: 1 mg/dL (ref 0.44–1.00)
Calcium: 9.1 mg/dL (ref 8.9–10.3)
Chloride: 103 mmol/L (ref 98–111)
GFR calc Af Amer: >60 mL/min (ref >60)
GFR calc non Af Amer: >60 mL/min (ref >60)
Glucose, Bld: 101 mg/dL — ABNORMAL HIGH (ref 70–99)
Potassium: 3.5 mmol/L (ref 3.5–5.1)
Sodium: 138 mmol/L (ref 135–145)
Total Bilirubin: 0.8 mg/dL (ref 0.3–1.2)
Total Protein: 6.6 g/dL (ref 6.5–8.1)

## 2018-06-25 LAB — CBC WITH DIFFERENTIAL/PLATELET
Abs Immature Granulocytes: 0.1 10*3/uL — ABNORMAL HIGH (ref 0.00–0.07)
Basophils Absolute: 0 10*3/uL (ref 0.0–0.1)
Basophils Relative: 1 %
Eosinophils Absolute: 0.4 10*3/uL (ref 0.0–0.5)
Eosinophils Relative: 5 %
HEMATOCRIT: 34.5 % — AB (ref 36.0–46.0)
Hemoglobin: 11.1 g/dL — ABNORMAL LOW (ref 12.0–15.0)
Immature Granulocytes: 1 %
LYMPHS ABS: 0.9 10*3/uL (ref 0.7–4.0)
LYMPHS PCT: 12 %
MCH: 31.1 pg (ref 26.0–34.0)
MCHC: 32.2 g/dL (ref 30.0–36.0)
MCV: 96.6 fL (ref 80.0–100.0)
Monocytes Absolute: 1.1 10*3/uL — ABNORMAL HIGH (ref 0.1–1.0)
Monocytes Relative: 14 %
NEUTROS ABS: 5 10*3/uL (ref 1.7–7.7)
Neutrophils Relative %: 67 %
Platelets: 178 10*3/uL (ref 150–400)
RBC: 3.57 MIL/uL — ABNORMAL LOW (ref 3.87–5.11)
RDW: 14.3 % (ref 11.5–15.5)
WBC: 7.5 10*3/uL (ref 4.0–10.5)
nRBC: 0 % (ref 0.0–0.2)

## 2018-06-25 MED ORDER — BORTEZOMIB CHEMO SQ INJECTION 3.5 MG (2.5MG/ML)
1.3000 mg/m2 | Freq: Once | INTRAMUSCULAR | Status: AC
  Administered 2018-06-25: 3 mg via SUBCUTANEOUS
  Filled 2018-06-25: qty 1.2

## 2018-06-25 MED ORDER — PROCHLORPERAZINE MALEATE 10 MG PO TABS: 10.0000 mg | ORAL_TABLET | Freq: Once | ORAL | Status: DC

## 2018-06-25 MED ORDER — LENALIDOMIDE 25 MG PO CAPS: 25.0000 mg | ORAL_CAPSULE | Freq: Every day | ORAL | 0 refills | Status: DC

## 2018-06-25 MED ORDER — PREGABALIN 200 MG PO CAPS: 200.0000 mg | ORAL_CAPSULE | Freq: Two times a day (BID) | ORAL | 0 refills | Status: DC

## 2018-06-25 NOTE — Patient Instructions (Signed)
[facility] Discharge Instructions for Patients Receiving Chemotherapy  Today you received the following chemotherapy agents velcade.    If you develop nausea and vomiting that is not controlled by your nausea medication, call the clinic.   BELOW ARE SYMPTOMS THAT SHOULD BE REPORTED IMMEDIATELY:  *FEVER GREATER THAN 100.5 F  *CHILLS WITH OR WITHOUT FEVER  NAUSEA AND VOMITING THAT IS NOT CONTROLLED WITH YOUR NAUSEA MEDICATION  *UNUSUAL SHORTNESS OF BREATH  *UNUSUAL BRUISING OR BLEEDING  TENDERNESS IN MOUTH AND THROAT WITH OR WITHOUT PRESENCE OF ULCERS  *URINARY PROBLEMS  *BOWEL PROBLEMS  UNUSUAL RASH Items with * indicate a potential emergency and should be followed up as soon as possible.  Feel free to call the clinic should you have any questions or concerns. The clinic phone number is [phone].  Please show the CHEMO ALERT CARD at check-in to the Emergency Department and triage nurse.   

## 2018-06-25 NOTE — Progress Notes (Signed)
Patient tolerated injection with no complaints voiced.  Site clean and dry with no bruising or swelling noted at site.  Band aid applied.  Refills for lyrica and revlimid signed by Dr. Delton Coombes.  VSS with discharge and left ambulatory with family with no s/s of distress noted.

## 2018-07-01 ENCOUNTER — Other Ambulatory Visit: Payer: Self-pay

## 2018-07-01 ENCOUNTER — Inpatient Hospital Stay (HOSPITAL_BASED_OUTPATIENT_CLINIC_OR_DEPARTMENT_OTHER): Payer: BLUE CROSS/BLUE SHIELD | Admitting: Hematology

## 2018-07-01 ENCOUNTER — Encounter (HOSPITAL_COMMUNITY): Payer: Self-pay | Admitting: Hematology

## 2018-07-01 ENCOUNTER — Inpatient Hospital Stay (HOSPITAL_COMMUNITY): Payer: BLUE CROSS/BLUE SHIELD

## 2018-07-01 ENCOUNTER — Encounter (HOSPITAL_COMMUNITY): Payer: Self-pay

## 2018-07-01 VITALS — BP 119/77 | HR 60 | Temp 98.3°F | Resp 18 | Wt 264.3 lb

## 2018-07-01 DIAGNOSIS — G629 Polyneuropathy, unspecified: Secondary | ICD-10-CM | POA: Diagnosis not present

## 2018-07-01 DIAGNOSIS — E119 Type 2 diabetes mellitus without complications: Secondary | ICD-10-CM

## 2018-07-01 DIAGNOSIS — C9000 Multiple myeloma not having achieved remission: Secondary | ICD-10-CM

## 2018-07-01 DIAGNOSIS — M545 Low back pain: Secondary | ICD-10-CM

## 2018-07-01 DIAGNOSIS — I10 Essential (primary) hypertension: Secondary | ICD-10-CM

## 2018-07-01 DIAGNOSIS — Z5111 Encounter for antineoplastic chemotherapy: Secondary | ICD-10-CM | POA: Diagnosis not present

## 2018-07-01 LAB — CBC WITH DIFFERENTIAL/PLATELET
Abs Immature Granulocytes: 0.02 10*3/uL (ref 0.00–0.07)
BASOS PCT: 0 %
Basophils Absolute: 0 10*3/uL (ref 0.0–0.1)
EOS ABS: 0.2 10*3/uL (ref 0.0–0.5)
Eosinophils Relative: 4 %
HCT: 33 % — ABNORMAL LOW (ref 36.0–46.0)
Hemoglobin: 10.6 g/dL — ABNORMAL LOW (ref 12.0–15.0)
Immature Granulocytes: 0 %
Lymphocytes Relative: 19 %
Lymphs Abs: 0.9 10*3/uL (ref 0.7–4.0)
MCH: 31 pg (ref 26.0–34.0)
MCHC: 32.1 g/dL (ref 30.0–36.0)
MCV: 96.5 fL (ref 80.0–100.0)
Monocytes Absolute: 1 10*3/uL (ref 0.1–1.0)
Monocytes Relative: 20 %
Neutro Abs: 2.7 10*3/uL (ref 1.7–7.7)
Neutrophils Relative %: 57 %
PLATELETS: 174 10*3/uL (ref 150–400)
RBC: 3.42 MIL/uL — ABNORMAL LOW (ref 3.87–5.11)
RDW: 14.6 % (ref 11.5–15.5)
WBC: 4.9 10*3/uL (ref 4.0–10.5)
nRBC: 0 % (ref 0.0–0.2)

## 2018-07-01 LAB — COMPREHENSIVE METABOLIC PANEL
ALT: 36 U/L (ref 0–44)
AST: 42 U/L — ABNORMAL HIGH (ref 15–41)
Albumin: 3.8 g/dL (ref 3.5–5.0)
Alkaline Phosphatase: 54 U/L (ref 38–126)
Anion gap: 9 (ref 5–15)
BUN: 30 mg/dL — ABNORMAL HIGH (ref 6–20)
CO2: 25 mmol/L (ref 22–32)
Calcium: 9 mg/dL (ref 8.9–10.3)
Chloride: 107 mmol/L (ref 98–111)
Creatinine, Ser: 1.11 mg/dL — ABNORMAL HIGH (ref 0.44–1.00)
GFR calc Af Amer: >60 mL/min (ref >60)
GFR, EST NON AFRICAN AMERICAN: 57 mL/min — AB (ref >60)
Glucose, Bld: 121 mg/dL — ABNORMAL HIGH (ref 70–99)
Potassium: 3.7 mmol/L (ref 3.5–5.1)
Sodium: 141 mmol/L (ref 135–145)
Total Bilirubin: 0.8 mg/dL (ref 0.3–1.2)
Total Protein: 6.4 g/dL — ABNORMAL LOW (ref 6.5–8.1)

## 2018-07-01 LAB — MAGNESIUM: MAGNESIUM: 1.9 mg/dL (ref 1.7–2.4)

## 2018-07-01 MED ORDER — BORTEZOMIB CHEMO SQ INJECTION 3.5 MG (2.5MG/ML)
1.3000 mg/m2 | Freq: Once | INTRAMUSCULAR | Status: AC
  Administered 2018-07-01: 3 mg via SUBCUTANEOUS
  Filled 2018-07-01: qty 1.2

## 2018-07-01 MED ORDER — DEXAMETHASONE 4 MG PO TABS: 40.0000 mg | ORAL_TABLET | ORAL | 0 refills | Status: DC

## 2018-07-01 NOTE — Progress Notes (Signed)
Pt presents today for Velcade injection. VSS. Pt has no complaints of any changes since the last visit. Pt requests Dexamethasone refill. Reported to Dr. Delton Coombes.   Labs reviewed by Dr. Delton Coombes. VO to proceed with treatment given.   Velcade injection given today per MD orders. Tolerated without adverse affects. Vital signs stable. No complaints at this time. Discharged from clinic ambulatory. F/U with [facility] as scheduled. Appt scheduled printed off and given to patient.

## 2018-07-01 NOTE — Patient Instructions (Signed)
[facility] Discharge Instructions for Patients Receiving Chemotherapy  Today you received the following chemotherapy agents   To help prevent nausea and vomiting after your treatment, we encourage you to take your nausea medication   If you develop nausea and vomiting that is not controlled by your nausea medication, call the clinic.   BELOW ARE SYMPTOMS THAT SHOULD BE REPORTED IMMEDIATELY:  *FEVER GREATER THAN 100.5 F  *CHILLS WITH OR WITHOUT FEVER  NAUSEA AND VOMITING THAT IS NOT CONTROLLED WITH YOUR NAUSEA MEDICATION  *UNUSUAL SHORTNESS OF BREATH  *UNUSUAL BRUISING OR BLEEDING  TENDERNESS IN MOUTH AND THROAT WITH OR WITHOUT PRESENCE OF ULCERS  *URINARY PROBLEMS  *BOWEL PROBLEMS  UNUSUAL RASH Items with * indicate a potential emergency and should be followed up as soon as possible.  Feel free to call the clinic should you have any questions or concerns. The clinic phone number is [phone].  Please show the CHEMO ALERT CARD at check-in to the Emergency Department and triage nurse.   

## 2018-07-01 NOTE — Assessment & Plan Note (Signed)
1.  IgG lambda multiple myeloma, stage III, del 17p: - Presentation with lower back pain since March, presented to the emergency room on 10/25/2017 with right groin pain, CT scan showed multiple lytic lesions, multiple lytic lesions on skeletal survey - SPEP shows 1.5 g/dL of monoclonal protein, beta-2 microglobulin of 4.8, free lambda light chains of 2869, kappa by lambda ratio of less than 0, elevated LDH. 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  FISH panel shows -14, -12, 13 q.-/-13 and 17p-.  Bone marrow biopsy shows 90% plasma cells. -4 cycles of RVD from 11/01/2017 through 01/14/2018.  Labs done on 01/20/2018 showed M spike to be negative. - She was evaluated by Dr. Laverta Baltimore at [facility].  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  She was discharged home on 03/07/2018.  Her energy levels are improving. - She was restarted back on Zometa on 04/22/2018. -Most recent blood work from [facility] shows negative SPEP, immunofixation and normal free light chain and ratio. - She was started back on Zometa on 04/22/2018.  Last Zometa was on 05/12/2018. - Today's day 111 of transplant.  I have talked to Dr. Alvie Heidelberg at [facility].  She has recommended 2 cycles of consolidation chemotherapy followed by maintenance. - We will give her 2 cycles of full dose Revlimid 2 weeks on 1 week off, weekly Velcade every 21 days. - First cycle of RVD on 06/11/2018.  She tolerated Revlimid very well.  She felt somewhat tired.  Denied any GI symptoms. - She may proceed with cycle 2 today.  She will start her Revlimid tomorrow. -I have asked her to start taking 81 mg of aspirin for thromboprophylaxis. -We will see her back in 6 weeks when she will start maintenance Velcade and Revlimid.  She has a  follow-up at [facility] on 08/12/2017 with repeat multiple myeloma panel.  2.  Low back pain: Her low back pain completely improved after starting myeloma therapy.  She is not taking any pain medicine.  3.  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 -Her numbness is well controlled with Lyrica 200 mg twice daily.  4.  ID prophylaxis: She will continue acyclovir twice daily.  5.  Hypomagnesemia: -She is taking magnesium 400 mg twice daily.  Her last magnesium was normal.  6.  Diabetes: - She is continuing metformin thousand milligrams twice daily. -She was recently started on Trulicity and Lantus 7 units at bedtime.

## 2018-07-01 NOTE — Patient Instructions (Signed)
[facility] at [facility] Discharge Instructions     Thank you for choosing [facility] at [facility] to provide your oncology and hematology care.  To afford each patient quality time with our provider, please arrive at least 15 minutes before your scheduled appointment time.   If you have a lab appointment with the Cancer Center please come in thru the  Main Entrance and check in at the main information desk  You need to re-schedule your appointment should you arrive 10 or more minutes late.  We strive to give you quality time with our providers, and arriving late affects you and other patients whose appointments are after yours.  Also, if you no show three or more times for appointments you may be dismissed from the clinic at the providers discretion.     Again, thank you for choosing [facility].  Our hope is that these requests will decrease the amount of time that you wait before being seen by our physicians.       _____________________________________________________________  Should you have questions after your visit to  Cancer Center, please contact our office at [phone] between the hours of 8:00 a.m. and 4:30 p.m.  Voicemails left after 4:00 p.m. will not be returned until the following business day.  For prescription refill requests, have your pharmacy contact our office and allow 72 hours.    Cancer Center Support Programs:   > Cancer Support Group  2nd Tuesday of the month 1pm-2pm, Journey Room    

## 2018-07-01 NOTE — Progress Notes (Signed)
[address] [place], [place] [address]   CLINIC:  Medical Oncology/Hematology  PCP:  Mikey Kirschner, [place] [address] [phone]   REASON FOR VISIT: Follow-up for multiple myeloma  CURRENT THERAPY: valcade, revlimid, dexamethasone  BRIEF ONCOLOGIC HISTORY:    Multiple myeloma ([place])   10/29/2017 Initial Diagnosis    Multiple myeloma ([place])    11/01/2017 - 01/24/2018 Chemotherapy    The patient had bortezomib SQ (VELCADE) chemo injection 3 mg, 1.3 mg/m2 = 3 mg, Subcutaneous,  Once, 5 of 5 cycles Administration: 3 mg (11/01/2017), 3 mg (11/08/2017), 3 mg (11/05/2017), 3 mg (11/22/2017), 3 mg (11/12/2017), 3 mg (11/29/2017), 3 mg (11/26/2017), 3 mg (12/03/2017), 3 mg (12/13/2017), 3 mg (12/20/2017), 3 mg (12/17/2017), 3 mg (12/24/2017), 3 mg (01/03/2018), 3 mg (01/10/2018), 3 mg (01/07/2018), 3 mg (01/14/2018), 3 mg (01/24/2018)  for chemotherapy treatment.     06/11/2018 -  Chemotherapy    The patient had bortezomib SQ (VELCADE) chemo injection 3 mg, 1.3 mg/m2 = 3 mg, Subcutaneous,  Once, 4 of 14 cycles Administration: 3 mg (06/11/2018), 3 mg (06/18/2018), 3 mg (06/25/2018), 3 mg (07/01/2018)  for chemotherapy treatment.       CANCER STAGING: Cancer Staging Multiple myeloma ([place]) Staging form: Plasma Cell Myeloma and Plasma Cell Disorders, AJCC 8th Edition - Clinical: No stage assigned - Unsigned    INTERVAL HISTORY:  Joanna Reid 52 y.o. female returns for routine follow-up for multiple myeloma. She is here today with her husband. She is tolerating treatment well. She is more fatigued with treatment. She has occasional diarrhea but it is manageable. Denies any nausea, vomiting, or constipation. Denies any new pains. Had not noticed any recent bleeding such as epistaxis, hematuria or hematochezia. Denies recent chest pain on exertion, shortness of breath on minimal exertion, pre-syncopal episodes, or palpitations. Denies any numbness or  tingling in hands or feet. Denies any recent fevers, infections, or recent hospitalizations. She reports her appetite at and her energy level at 75%. She is trying to remain active at home. She has a follow up appointment at [facility] on Feb 4th for a recheck.     REVIEW OF SYSTEMS:  Review of Systems  Constitutional: Positive for fatigue.     PAST MEDICAL/SURGICAL HISTORY:  Past Medical History:  Diagnosis Date  . Acid reflux   . Allergic rhinitis   . Diabetes mellitus    type 2  . Gout   . Gout   . HBP (high blood pressure)   . History of kidney stones   . Migraines    Past Surgical History:  Procedure Laterality Date  . CESAREAN SECTION    . EXTRACORPOREAL SHOCK WAVE LITHOTRIPSY Left 10/10/2017   Procedure: LEFT EXTRACORPOREAL SHOCK WAVE LITHOTRIPSY (ESWL);  Surgeon: Bjorn Loser, MD;  Location: [facility];  Service: Urology;  Laterality: Left;  . EYE SURGERY    . HEMORRHOID SURGERY N/A 11/19/2012   Procedure: HEMORRHOIDECTOMY;  Surgeon: Jamesetta So, MD;  Location: [facility];  Service: General;  Laterality: N/A;  . kidney stones  1998  . LAPAROSCOPIC UNILATERAL SALPINGO OOPHERECTOMY  05/14/2012   Procedure: LAPAROSCOPIC UNILATERAL SALPINGO OOPHORECTOMY;  Surgeon: [name], MD;  Location: [facility];  Service: Gynecology;  Laterality: Right;  laparoscopic right salpingo-oophorectomy  . PARTIAL HYSTERECTOMY    . TONSILECTOMY, ADENOIDECTOMY, BILATERAL MYRINGOTOMY AND TUBES    . VESICOVAGINAL FISTULA CLOSURE W/ TAH       SOCIAL HISTORY:  Social History   Socioeconomic History  . Marital status: Married    Spouse name: Not on file  . Number of children: Not on file  . Years of education: 72  . Highest education level: Not on file  Occupational History    Employer: [employer]  . Financial resource strain: Not on file  . Food insecurity:    Worry: Never true    Inability: Never true  . Transportation needs:    Medical: Not on file    Non-medical: Not on file    Tobacco Use  . Smoking status: Former Smoker    Last attempt to quit: 02/27/1999    Years since quitting: 19.3  . Smokeless tobacco: Never Used  . Tobacco comment: socially   Substance and Sexual Activity  . Alcohol use: No    Alcohol/week: 0.0 standard drinks  . Drug use: No  . Sexual activity: Yes  Lifestyle  . Physical activity:    Days per week: Not on file    Minutes per session: Not on file  . Stress: Not on file  Relationships  . Social connections:    Talks on phone: Not on file    Gets together: Not on file    Attends religious service: Not on file    Active member of club or organization: Not on file    Attends meetings of clubs or organizations: Not on file    Relationship status: Not on file  . Intimate partner violence:    Fear of current or ex partner: Not on file    Emotionally abused: Not on file    Physically abused: Not on file    Forced sexual activity: Not on file  Other Topics Concern  . Not on file  Social History Narrative  . Not on file    FAMILY HISTORY:  Family History  Problem Relation Age of Onset  . Arthritis Other   . Cancer Other   . Diabetes Other   . Hypertension Mother   . Dementia Mother   . Diabetes Father   . ALS Father   . Diabetes Brother   . Hypertension Brother   . Cancer Paternal Aunt   . COPD Maternal Grandmother   . Cancer Maternal Grandfather   . Anesthesia problems Paternal Grandfather     CURRENT MEDICATIONS:  Outpatient Encounter Medications as of 07/01/2018  Medication Sig Note  . acyclovir (ZOVIRAX) 800 MG tablet    . albuterol (PROVENTIL HFA;VENTOLIN HFA) 108 (90 Base) MCG/ACT inhaler Inhale 1-2 puffs into the lungs daily as needed.   Marland Kitchen allopurinol (ZYLOPRIM) 300 MG tablet TAKE 1 TABLET BY MOUTH  DAILY FOR GOUT   . Bortezomib (VELCADE IJ) Inject as directed.   . bumetanide (BUMEX) 1 MG tablet Take 1 tablet (1 mg total) by mouth daily.   . Calcium Carb-Cholecalciferol (CALCIUM 600+D3 PO) Take by mouth.  11/08/2017: Calcium 664m with Vitamin D 1,000 units BID  . carvedilol (COREG) 6.25 MG tablet Take by mouth.   . dexamethasone (DECADRON) 4 MG tablet Take 10 tablets (40 mg total) by mouth once a week.   . diazepam (VALIUM) 5 MG tablet TAKE 1 TABLET BY MOUTH AT BEDTIME AS NEEDED FOR ANXIETY OR INSOMNIA   . docusate sodium (COLACE) 100 MG capsule Take by mouth.   . fluticasone (FLONASE) 50 MCG/ACT nasal spray Place 2 sprays into the nose 2 (two) times daily.   .Marland Kitchenglucose blood (PRECISION QID TEST) test strip Use 1 each (  1 strip total) 4 (four) times daily Use as instructed. OneTouch Verio   . HYDROcodone-acetaminophen (NORCO/VICODIN) 5-325 MG tablet Take 1 tablet by mouth every 6 (six) hours as needed for moderate pain.   Marland Kitchen insulin glargine (LANTUS) 100 UNIT/ML injection Inject 7 Units into the skin at bedtime.   . Lancets Misc. (UNISTIK 2 NORMAL) MISC Use 1 each 4 (four) times daily Use as instructed.   Marland Kitchen lenalidomide (REVLIMID) 25 MG capsule Take 1 capsule (25 mg total) by mouth daily. 2 weeks on, one-week off   . losartan (COZAAR) 100 MG tablet TAKE 1 TABLET BY MOUTH  DAILY   . magnesium oxide (MAG-OX) 400 (241.3 Mg) MG tablet Take 1 tablet (400 mg total) by mouth 2 (two) times daily.   . magnesium oxide (MAG-OX) 400 MG tablet Take by mouth.   . metFORMIN (GLUCOPHAGE) 500 MG tablet TAKE 1 TABLET BY MOUTH ONCE DAILY WITH BREAKFAST (Patient taking differently: 1,000 mg. TAKE 2 TABLETS BY MOUTH IN THE MORNING AND AT NIGHT)   . omeprazole (PRILOSEC) 20 MG capsule Take 1 capsule (20 mg total) by mouth daily.   . ondansetron (ZOFRAN) 8 MG tablet Take 1 tablet (8 mg total) by mouth every 8 (eight) hours as needed.   Glory Rosebush VERIO test strip USE 1 STRIP TO CHECK GLUCOSE 4 TIMES DAILY AS DIRECTED   . potassium chloride SA (K-DUR,KLOR-CON) 20 MEQ tablet    . pregabalin (LYRICA) 200 MG capsule Take 1 capsule (200 mg total) by mouth 2 (two) times daily.   . prochlorperazine (COMPAZINE) 10 MG tablet Take 1  tablet (10 mg total) by mouth every 6 (six) hours as needed for nausea or vomiting.   . rosuvastatin (CRESTOR) 5 MG tablet    . TRULICITY 1.61 WR/6.0AV SOPN    . verapamil (CALAN-SR) 240 MG CR tablet Take 1 tablet (240 mg total) by mouth at bedtime.   . [DISCONTINUED] dexamethasone (DECADRON) 4 MG tablet Take 10 tablets (40 mg total) by mouth once a week.    No facility-administered encounter medications on file as of 07/01/2018.     ALLERGIES:  No Known Allergies   PHYSICAL EXAM:  ECOG Performance status: 1  VITAL SIGNS:BP: 119/77,P:60, R:18, T:98.3, O2:98% [id number]:914.7  Physical Exam Constitutional:      Appearance: Normal appearance. She is normal weight.  Cardiovascular:     Rate and Rhythm: Normal rate and regular rhythm.     Heart sounds: Normal heart sounds.  Pulmonary:     Effort: Pulmonary effort is normal.     Breath sounds: Normal breath sounds.  Musculoskeletal: Normal range of motion.  Skin:    General: Skin is warm and dry.  Neurological:     Mental Status: She is alert and oriented to person, place, and time. Mental status is at baseline.  Psychiatric:        Mood and Affect: Mood normal.        Behavior: Behavior normal.        Thought Content: Thought content normal.        Judgment: Judgment normal.      LABORATORY DATA:  I have reviewed the labs as listed.  CBC    Component Value Date/Time   WBC 4.9 07/01/2018 0846   RBC 3.42 (L) 07/01/2018 0846   HGB 10.6 (L) 07/01/2018 0846   HGB 14.2 08/02/2015 1113   HCT 33.0 (L) 07/01/2018 0846   HCT 41.1 08/02/2015 1113   PLT 174 07/01/2018 0846   PLT  371 08/02/2015 1113   MCV 96.5 07/01/2018 0846   MCV 84 08/02/2015 1113   MCH 31.0 07/01/2018 0846   MCHC 32.1 07/01/2018 0846   RDW 14.6 07/01/2018 0846   RDW 15.1 08/02/2015 1113   LYMPHSABS 0.9 07/01/2018 0846   LYMPHSABS 2.9 08/02/2015 1113   MONOABS 1.0 07/01/2018 0846   EOSABS 0.2 07/01/2018 0846   EOSABS 0.2 08/02/2015 1113   BASOSABS 0.0  07/01/2018 0846   BASOSABS 0.1 08/02/2015 1113   CMP Latest Ref Rng & Units 07/01/2018 06/25/2018 06/18/2018  Glucose 70 - 99 mg/dL 121(H) 101(H) 107(H)  BUN 6 - 20 mg/dL 30(H) 17 19  Creatinine 0.44 - 1.00 mg/dL 1.11(H) 1.00 1.05(H)  Sodium 135 - 145 mmol/L 141 138 138  Potassium 3.5 - 5.1 mmol/L 3.7 3.5 3.5  Chloride 98 - 111 mmol/L 107 103 106  CO2 22 - 32 mmol/L 25 26 23   Calcium 8.9 - 10.3 mg/dL 9.0 9.1 9.2  Total Protein 6.5 - 8.1 g/dL 6.4(L) 6.6 6.5  Total Bilirubin 0.3 - 1.2 mg/dL 0.8 0.8 0.5  Alkaline Phos 38 - 126 U/L 54 58 61  AST 15 - 41 U/L 42(H) 23 29  ALT 0 - 44 U/L 36 26 26       DIAGNOSTIC IMAGING:  I have independently reviewed the scans and discussed with the patient.   I have reviewed Francene Finders, NP's note and agree with the documentation.  I personally performed a face-to-face visit, made revisions and my assessment and plan is as follows.    ASSESSMENT & PLAN:   Multiple myeloma ([place]) 1.  IgG lambda multiple myeloma, stage III, del 17p: - Presentation with lower back pain since March, presented to the emergency room on 10/25/2017 with right groin pain, CT scan showed multiple lytic lesions, multiple lytic lesions on skeletal survey - SPEP shows 1.5 g/dL of monoclonal protein, beta-2 microglobulin of 4.8, free lambda light chains of 2869, kappa by lambda ratio of less than 0, elevated LDH. 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  FISH panel shows -14, -12, 13 q.-/-13 and 17p-.  Bone marrow biopsy shows 90% plasma cells. -4 cycles of RVD from 11/01/2017 through 01/14/2018.  Labs done on 01/20/2018 showed M spike to be negative. - She was evaluated by Dr. Laverta Baltimore at [facility].  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  She was discharged home on  03/07/2018.  Her energy levels are improving. - She was restarted back on Zometa on 04/22/2018. -Most recent blood work from [facility] shows negative SPEP, immunofixation and normal free light chain and ratio. - She was started back on Zometa on 04/22/2018.  Last Zometa was on 05/12/2018. - Today's day 111 of transplant.  I have talked to Dr. Alvie Heidelberg at [facility].  She has recommended 2 cycles of consolidation chemotherapy followed by maintenance. - We will give her 2 cycles of full dose Revlimid 2 weeks on 1 week off, weekly Velcade every 21 days. - First cycle of RVD on 06/11/2018.  She tolerated Revlimid very well.  She felt somewhat tired.  Denied any GI symptoms. - She may proceed with cycle 2 today.  She will start her Revlimid tomorrow. -I have asked her to start taking 81 mg of aspirin for thromboprophylaxis. -We will see her back  in 6 weeks when she will start maintenance Velcade and Revlimid.  She has a follow-up at [facility] on 08/12/2017 with repeat multiple myeloma panel.  2.  Low back pain: Her low back pain completely improved after starting myeloma therapy.  She is not taking any pain medicine.  3.  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 -Her numbness is well controlled with Lyrica 200 mg twice daily.  4.  ID prophylaxis: She will continue acyclovir twice daily.  5.  Hypomagnesemia: -She is taking magnesium 400 mg twice daily.  Her last magnesium was normal.  6.  Diabetes: - She is continuing metformin thousand milligrams twice daily. -She was recently started on Trulicity and Lantus 7 units at bedtime.      Orders placed this encounter:  Orders Placed This Encounter  Procedures  . Magnesium  . Magnesium  . CBC with Differential/Platelet  . Comprehensive metabolic panel      Derek Jack, MD [place] [facility] [phone]

## 2018-07-04 ENCOUNTER — Other Ambulatory Visit (HOSPITAL_COMMUNITY): Payer: Self-pay | Admitting: Hematology

## 2018-07-04 DIAGNOSIS — C9000 Multiple myeloma not having achieved remission: Secondary | ICD-10-CM

## 2018-07-07 DIAGNOSIS — Z029 Encounter for administrative examinations, unspecified: Secondary | ICD-10-CM

## 2018-07-08 ENCOUNTER — Inpatient Hospital Stay (HOSPITAL_COMMUNITY): Payer: BLUE CROSS/BLUE SHIELD

## 2018-07-08 ENCOUNTER — Encounter (HOSPITAL_COMMUNITY): Payer: Self-pay

## 2018-07-08 ENCOUNTER — Other Ambulatory Visit: Payer: Self-pay

## 2018-07-08 ENCOUNTER — Other Ambulatory Visit (HOSPITAL_COMMUNITY): Payer: Self-pay | Admitting: *Deleted

## 2018-07-08 VITALS — BP 130/75 | HR 54 | Temp 97.9°F | Wt 264.0 lb

## 2018-07-08 DIAGNOSIS — C9000 Multiple myeloma not having achieved remission: Secondary | ICD-10-CM

## 2018-07-08 DIAGNOSIS — Z5111 Encounter for antineoplastic chemotherapy: Secondary | ICD-10-CM | POA: Diagnosis not present

## 2018-07-08 LAB — CBC WITH DIFFERENTIAL/PLATELET
Abs Immature Granulocytes: 0.36 10*3/uL — ABNORMAL HIGH (ref 0.00–0.07)
Basophils Absolute: 0.1 10*3/uL (ref 0.0–0.1)
Basophils Relative: 2 %
Eosinophils Absolute: 0.4 10*3/uL (ref 0.0–0.5)
Eosinophils Relative: 8 %
HCT: 34.9 % — ABNORMAL LOW (ref 36.0–46.0)
Hemoglobin: 11.1 g/dL — ABNORMAL LOW (ref 12.0–15.0)
Immature Granulocytes: 8 %
Lymphocytes Relative: 13 %
Lymphs Abs: 0.6 10*3/uL — ABNORMAL LOW (ref 0.7–4.0)
MCH: 31.4 pg (ref 26.0–34.0)
MCHC: 31.8 g/dL (ref 30.0–36.0)
MCV: 98.6 fL (ref 80.0–100.0)
MONO ABS: 0.3 10*3/uL (ref 0.1–1.0)
Monocytes Relative: 7 %
Neutro Abs: 2.8 10*3/uL (ref 1.7–7.7)
Neutrophils Relative %: 62 %
Platelets: 192 10*3/uL (ref 150–400)
RBC: 3.54 MIL/uL — ABNORMAL LOW (ref 3.87–5.11)
RDW: 15 % (ref 11.5–15.5)
WBC: 4.6 10*3/uL (ref 4.0–10.5)
nRBC: 0.4 % — ABNORMAL HIGH (ref 0.0–0.2)

## 2018-07-08 LAB — COMPREHENSIVE METABOLIC PANEL
ALK PHOS: 54 U/L (ref 38–126)
ALT: 37 U/L (ref 0–44)
AST: 29 U/L (ref 15–41)
Albumin: 3.8 g/dL (ref 3.5–5.0)
Anion gap: 8 (ref 5–15)
BUN: 25 mg/dL — ABNORMAL HIGH (ref 6–20)
CO2: 26 mmol/L (ref 22–32)
Calcium: 9.3 mg/dL (ref 8.9–10.3)
Chloride: 106 mmol/L (ref 98–111)
Creatinine, Ser: 1.25 mg/dL — ABNORMAL HIGH (ref 0.44–1.00)
GFR calc non Af Amer: 49 mL/min — ABNORMAL LOW (ref >60)
GFR, EST AFRICAN AMERICAN: 57 mL/min — AB (ref >60)
Glucose, Bld: 135 mg/dL — ABNORMAL HIGH (ref 70–99)
Potassium: 3.6 mmol/L (ref 3.5–5.1)
Sodium: 140 mmol/L (ref 135–145)
Total Bilirubin: 0.5 mg/dL (ref 0.3–1.2)
Total Protein: 6.4 g/dL — ABNORMAL LOW (ref 6.5–8.1)

## 2018-07-08 MED ORDER — PROCHLORPERAZINE MALEATE 10 MG PO TABS: 10.0000 mg | ORAL_TABLET | Freq: Once | ORAL | Status: DC

## 2018-07-08 MED ORDER — BORTEZOMIB CHEMO SQ INJECTION 3.5 MG (2.5MG/ML)
1.3000 mg/m2 | Freq: Once | INTRAMUSCULAR | Status: AC
  Administered 2018-07-08: 3 mg via SUBCUTANEOUS
  Filled 2018-07-08: qty 1.2

## 2018-07-08 NOTE — Progress Notes (Signed)
Pt presents today for Velcade injection. VSS. No complaints of pain or any changes since last visit. Compazine 10mg  taken PO per patient prior to visit. Pt taking Revlimid , last dose 07/07/2018. Next week is pt's off week per pt. Pt has not missed any doses. Appointment schedule printed and given to patient.   Joanna Reid presents today for Velcade injection per MD orders.  administered SQ in left. .Abdomen. Administration without incident. Patient tolerated well.  Treatment given today per MD orders. Tolerated infusion without adverse affects. Vital signs stable. No complaints at this time. Discharged from clinic ambulatory. F/U with [facility] as scheduled. Appt schedule printed off and given to patient at this time.

## 2018-07-08 NOTE — Telephone Encounter (Signed)
Created in error

## 2018-07-08 NOTE — Patient Instructions (Signed)
[facility] Discharge Instructions for Patients Receiving Chemotherapy  Today you received the following chemotherapy agents   To help prevent nausea and vomiting after your treatment, we encourage you to take your nausea medication   If you develop nausea and vomiting that is not controlled by your nausea medication, call the clinic.   BELOW ARE SYMPTOMS THAT SHOULD BE REPORTED IMMEDIATELY:  *FEVER GREATER THAN 100.5 F  *CHILLS WITH OR WITHOUT FEVER  NAUSEA AND VOMITING THAT IS NOT CONTROLLED WITH YOUR NAUSEA MEDICATION  *UNUSUAL SHORTNESS OF BREATH  *UNUSUAL BRUISING OR BLEEDING  TENDERNESS IN MOUTH AND THROAT WITH OR WITHOUT PRESENCE OF ULCERS  *URINARY PROBLEMS  *BOWEL PROBLEMS  UNUSUAL RASH Items with * indicate a potential emergency and should be followed up as soon as possible.  Feel free to call the clinic should you have any questions or concerns. The clinic phone number is [phone].  Please show the CHEMO ALERT CARD at check-in to the Emergency Department and triage nurse.   

## 2018-07-11 ENCOUNTER — Other Ambulatory Visit (HOSPITAL_COMMUNITY): Payer: Self-pay | Admitting: Hematology

## 2018-07-15 ENCOUNTER — Inpatient Hospital Stay (HOSPITAL_COMMUNITY): Payer: BLUE CROSS/BLUE SHIELD | Attending: Hematology

## 2018-07-15 ENCOUNTER — Other Ambulatory Visit (HOSPITAL_COMMUNITY): Payer: Self-pay

## 2018-07-15 ENCOUNTER — Other Ambulatory Visit: Payer: Self-pay

## 2018-07-15 ENCOUNTER — Ambulatory Visit (HOSPITAL_COMMUNITY): Payer: Self-pay

## 2018-07-15 ENCOUNTER — Encounter (HOSPITAL_COMMUNITY): Payer: Self-pay

## 2018-07-15 ENCOUNTER — Ambulatory Visit (HOSPITAL_COMMUNITY): Payer: Self-pay | Admitting: Hematology

## 2018-07-15 ENCOUNTER — Inpatient Hospital Stay (HOSPITAL_COMMUNITY): Payer: BLUE CROSS/BLUE SHIELD

## 2018-07-15 ENCOUNTER — Other Ambulatory Visit (HOSPITAL_COMMUNITY): Payer: Self-pay | Admitting: *Deleted

## 2018-07-15 VITALS — BP 126/78 | HR 71 | Temp 97.6°F | Resp 18 | Wt 265.6 lb

## 2018-07-15 DIAGNOSIS — C9000 Multiple myeloma not having achieved remission: Secondary | ICD-10-CM | POA: Diagnosis not present

## 2018-07-15 DIAGNOSIS — Z5112 Encounter for antineoplastic immunotherapy: Secondary | ICD-10-CM | POA: Insufficient documentation

## 2018-07-15 LAB — CBC WITH DIFFERENTIAL/PLATELET
Abs Immature Granulocytes: 0.06 10*3/uL (ref 0.00–0.07)
Basophils Absolute: 0 10*3/uL (ref 0.0–0.1)
Basophils Relative: 1 %
Eosinophils Absolute: 0.2 10*3/uL (ref 0.0–0.5)
Eosinophils Relative: 4 %
HEMATOCRIT: 34.1 % — AB (ref 36.0–46.0)
Hemoglobin: 10.6 g/dL — ABNORMAL LOW (ref 12.0–15.0)
Immature Granulocytes: 1 %
LYMPHS PCT: 16 %
Lymphs Abs: 0.8 10*3/uL (ref 0.7–4.0)
MCH: 30.5 pg (ref 26.0–34.0)
MCHC: 31.1 g/dL (ref 30.0–36.0)
MCV: 98 fL (ref 80.0–100.0)
Monocytes Absolute: 0.7 10*3/uL (ref 0.1–1.0)
Monocytes Relative: 15 %
Neutro Abs: 2.9 10*3/uL (ref 1.7–7.7)
Neutrophils Relative %: 63 %
Platelets: 139 10*3/uL — ABNORMAL LOW (ref 150–400)
RBC: 3.48 MIL/uL — ABNORMAL LOW (ref 3.87–5.11)
RDW: 15 % (ref 11.5–15.5)
WBC: 4.6 10*3/uL (ref 4.0–10.5)
nRBC: 0 % (ref 0.0–0.2)

## 2018-07-15 LAB — COMPREHENSIVE METABOLIC PANEL
ALT: 38 U/L (ref 0–44)
AST: 28 U/L (ref 15–41)
Albumin: 3.8 g/dL (ref 3.5–5.0)
Alkaline Phosphatase: 58 U/L (ref 38–126)
Anion gap: 9 (ref 5–15)
BUN: 21 mg/dL — ABNORMAL HIGH (ref 6–20)
CALCIUM: 9.3 mg/dL (ref 8.9–10.3)
CO2: 26 mmol/L (ref 22–32)
Chloride: 104 mmol/L (ref 98–111)
Creatinine, Ser: 1.19 mg/dL — ABNORMAL HIGH (ref 0.44–1.00)
GFR calc Af Amer: >60 mL/min (ref >60)
GFR calc non Af Amer: 52 mL/min — ABNORMAL LOW (ref >60)
Glucose, Bld: 118 mg/dL — ABNORMAL HIGH (ref 70–99)
Potassium: 3.7 mmol/L (ref 3.5–5.1)
Sodium: 139 mmol/L (ref 135–145)
Total Bilirubin: 1 mg/dL (ref 0.3–1.2)
Total Protein: 6.4 g/dL — ABNORMAL LOW (ref 6.5–8.1)

## 2018-07-15 MED ORDER — LENALIDOMIDE 10 MG PO CAPS: ORAL_CAPSULE | ORAL | 0 refills | Status: DC

## 2018-07-15 MED ORDER — PROCHLORPERAZINE MALEATE 10 MG PO TABS: 10.0000 mg | ORAL_TABLET | Freq: Once | ORAL | Status: DC

## 2018-07-15 MED ORDER — BORTEZOMIB CHEMO SQ INJECTION 3.5 MG (2.5MG/ML)
1.3000 mg/m2 | Freq: Once | INTRAMUSCULAR | Status: AC
  Administered 2018-07-15: 3 mg via SUBCUTANEOUS
  Filled 2018-07-15: qty 1.2

## 2018-07-15 NOTE — Progress Notes (Signed)
Joanna Reid presents today for injection per the provider's orders.  Velcade administration without incident; see MAR for injection details.  Patient tolerated procedure well and without incident.  No questions or complaints noted at this time.  Discharged ambulatory.

## 2018-07-15 NOTE — Telephone Encounter (Signed)
Change in dose per Dr. Delton Coombes. Patient is now on maintenance dose.

## 2018-07-17 ENCOUNTER — Other Ambulatory Visit: Payer: Self-pay | Admitting: Family Medicine

## 2018-07-20 ENCOUNTER — Other Ambulatory Visit: Payer: Self-pay | Admitting: Family Medicine

## 2018-07-21 NOTE — Telephone Encounter (Signed)
It looks like pt now going to [facility] endocrinologist for her diabetes, ref times one but will need to get further refills from the endocrin managing her diabetes (write on oharm note)

## 2018-07-21 NOTE — Telephone Encounter (Signed)
Last seen 12/30/17 for sore throat

## 2018-07-22 ENCOUNTER — Ambulatory Visit (HOSPITAL_COMMUNITY): Payer: Self-pay

## 2018-07-22 ENCOUNTER — Other Ambulatory Visit (HOSPITAL_COMMUNITY): Payer: Self-pay

## 2018-07-29 ENCOUNTER — Other Ambulatory Visit: Payer: Self-pay

## 2018-07-29 ENCOUNTER — Inpatient Hospital Stay (HOSPITAL_COMMUNITY): Payer: BLUE CROSS/BLUE SHIELD

## 2018-07-29 ENCOUNTER — Inpatient Hospital Stay (HOSPITAL_BASED_OUTPATIENT_CLINIC_OR_DEPARTMENT_OTHER): Payer: BLUE CROSS/BLUE SHIELD | Admitting: Hematology

## 2018-07-29 ENCOUNTER — Encounter (HOSPITAL_COMMUNITY): Payer: Self-pay | Admitting: Hematology

## 2018-07-29 DIAGNOSIS — C9000 Multiple myeloma not having achieved remission: Secondary | ICD-10-CM

## 2018-07-29 DIAGNOSIS — C9001 Multiple myeloma in remission: Secondary | ICD-10-CM

## 2018-07-29 DIAGNOSIS — I10 Essential (primary) hypertension: Secondary | ICD-10-CM | POA: Diagnosis not present

## 2018-07-29 DIAGNOSIS — E119 Type 2 diabetes mellitus without complications: Secondary | ICD-10-CM

## 2018-07-29 DIAGNOSIS — G629 Polyneuropathy, unspecified: Secondary | ICD-10-CM

## 2018-07-29 DIAGNOSIS — Z9484 Stem cells transplant status: Secondary | ICD-10-CM

## 2018-07-29 DIAGNOSIS — Z5112 Encounter for antineoplastic immunotherapy: Secondary | ICD-10-CM | POA: Diagnosis not present

## 2018-07-29 LAB — COMPREHENSIVE METABOLIC PANEL
ALK PHOS: 66 U/L (ref 38–126)
ALT: 23 U/L (ref 0–44)
ANION GAP: 9 (ref 5–15)
AST: 22 U/L (ref 15–41)
Albumin: 3.6 g/dL (ref 3.5–5.0)
BUN: 22 mg/dL — ABNORMAL HIGH (ref 6–20)
CO2: 24 mmol/L (ref 22–32)
Calcium: 9.4 mg/dL (ref 8.9–10.3)
Chloride: 109 mmol/L (ref 98–111)
Creatinine, Ser: 0.98 mg/dL (ref 0.44–1.00)
GFR calc non Af Amer: >60 mL/min (ref >60)
GLUCOSE: 127 mg/dL — AB (ref 70–99)
Potassium: 3.9 mmol/L (ref 3.5–5.1)
Sodium: 142 mmol/L (ref 135–145)
Total Bilirubin: 0.7 mg/dL (ref 0.3–1.2)
Total Protein: 6.2 g/dL — ABNORMAL LOW (ref 6.5–8.1)

## 2018-07-29 LAB — CBC WITH DIFFERENTIAL/PLATELET
Abs Immature Granulocytes: 0.04 10*3/uL (ref 0.00–0.07)
Basophils Absolute: 0.1 10*3/uL (ref 0.0–0.1)
Basophils Relative: 2 %
EOS PCT: 4 %
Eosinophils Absolute: 0.1 10*3/uL (ref 0.0–0.5)
HCT: 30.3 % — ABNORMAL LOW (ref 36.0–46.0)
HEMOGLOBIN: 9.7 g/dL — AB (ref 12.0–15.0)
Immature Granulocytes: 1 %
Lymphocytes Relative: 23 %
Lymphs Abs: 0.7 10*3/uL (ref 0.7–4.0)
MCH: 31.5 pg (ref 26.0–34.0)
MCHC: 32 g/dL (ref 30.0–36.0)
MCV: 98.4 fL (ref 80.0–100.0)
MONO ABS: 0.7 10*3/uL (ref 0.1–1.0)
Monocytes Relative: 21 %
Neutro Abs: 1.5 10*3/uL — ABNORMAL LOW (ref 1.7–7.7)
Neutrophils Relative %: 49 %
Platelets: 185 10*3/uL (ref 150–400)
RBC: 3.08 MIL/uL — ABNORMAL LOW (ref 3.87–5.11)
RDW: 15.9 % — ABNORMAL HIGH (ref 11.5–15.5)
WBC: 3.1 10*3/uL — ABNORMAL LOW (ref 4.0–10.5)
nRBC: 0 % (ref 0.0–0.2)

## 2018-07-29 LAB — MAGNESIUM: Magnesium: 1.5 mg/dL — ABNORMAL LOW (ref 1.7–2.4)

## 2018-07-29 MED ORDER — PROCHLORPERAZINE MALEATE 10 MG PO TABS: 10.0000 mg | ORAL_TABLET | Freq: Once | ORAL | Status: DC

## 2018-07-29 MED ORDER — BORTEZOMIB CHEMO SQ INJECTION 3.5 MG (2.5MG/ML)
1.3000 mg/m2 | Freq: Once | INTRAMUSCULAR | Status: AC
  Administered 2018-07-29: 3 mg via SUBCUTANEOUS
  Filled 2018-07-29: qty 1.2

## 2018-07-29 MED ORDER — PROCHLORPERAZINE MALEATE 10 MG PO TABS
ORAL_TABLET | ORAL | Status: AC
  Filled 2018-07-29: qty 1

## 2018-07-29 NOTE — Progress Notes (Signed)
1025 Progress note from NP and labs reviewed and pt approved for Velcade injection today per MD. Pt continues to take her Revlimid as prescribed without any issues or missing any doses per pt.                                    Joanna Reid tolerated Velcade injection well without complaints or incident. Pt discharged self ambulatory in satisfactory condition

## 2018-07-29 NOTE — Progress Notes (Signed)
I met with patient during visit today. She is going to be starting her new dose of revlimid tonight.  Patient needs labs drawn for [facility] and provided me with lab kit that was issued by her physicians at [facility].  Patient will return and have the labs drawn on 1/30 so that it will be received and processed in time for her office visit with [facility] on 2/10.

## 2018-07-29 NOTE — Patient Instructions (Signed)
[place] at [facility] Discharge Instructions  Follow up in 4 weeks with the doctor.   Thank you for choosing [place] at [facility] to provide your oncology and hematology care.  To afford each patient quality time with our provider, please arrive at least 15 minutes before your scheduled appointment time.   If you have a lab appointment with the [facility] please come in thru the  Main Entrance and check in at the main information desk  You need to re-schedule your appointment should you arrive 10 or more minutes late.  We strive to give you quality time with our providers, and arriving late affects you and other patients whose appointments are after yours.  Also, if you no show three or more times for appointments you may be dismissed from the clinic at the providers discretion.     Again, thank you for choosing [facility].  Our hope is that these requests will decrease the amount of time that you wait before being seen by our physicians.       _____________________________________________________________  Should you have questions after your visit to [facility], please contact our office at [phone] between the hours of 8:00 a.m. and 4:30 p.m.  Voicemails left after 4:00 p.m. will not be returned until the following business day.  For prescription refill requests, have your pharmacy contact our office and allow 72 hours.    Cancer Center Support Programs:   > Cancer Support Group  2nd Tuesday of the month 1pm-2pm, Journey Room

## 2018-07-29 NOTE — Progress Notes (Signed)
[place], [place] [address]   CLINIC:  Medical Oncology/Hematology  PCP:  Mikey Kirschner, MD [address] [place] [address] [phone]   REASON FOR VISIT: Follow-up for multiple myeloma  CURRENT THERAPY:valcade, revlimid, dexamethasone  BRIEF ONCOLOGIC HISTORY:    Multiple myeloma ([place])   10/29/2017 Initial Diagnosis    Multiple myeloma ([place])    11/01/2017 - 01/24/2018 Chemotherapy    The patient had bortezomib SQ (VELCADE) chemo injection 3 mg, 1.3 mg/m2 = 3 mg, Subcutaneous,  Once, 5 of 5 cycles Administration: 3 mg (11/01/2017), 3 mg (11/08/2017), 3 mg (11/05/2017), 3 mg (11/22/2017), 3 mg (11/12/2017), 3 mg (11/29/2017), 3 mg (11/26/2017), 3 mg (12/03/2017), 3 mg (12/13/2017), 3 mg (12/20/2017), 3 mg (12/17/2017), 3 mg (12/24/2017), 3 mg (01/03/2018), 3 mg (01/10/2018), 3 mg (01/07/2018), 3 mg (01/14/2018), 3 mg (01/24/2018)  for chemotherapy treatment.     06/11/2018 -  Chemotherapy    The patient had bortezomib SQ (VELCADE) chemo injection 3 mg, 1.3 mg/m2 = 3 mg, Subcutaneous,  Once, 7 of 14 cycles Administration: 3 mg (06/11/2018), 3 mg (06/18/2018), 3 mg (06/25/2018), 3 mg (07/01/2018), 3 mg (07/08/2018), 3 mg (07/15/2018)  for chemotherapy treatment.       CANCER STAGING: Cancer Staging Multiple myeloma ([place]) Staging form: Plasma Cell Myeloma and Plasma Cell Disorders, AJCC 8th Edition - Clinical: No stage assigned - Unsigned    INTERVAL HISTORY:  [name] 53 y.o. female returns for routine follow-up for multiple myeloma. She is here today alone to start her maintenance  treatment. She reports she is feeling good. She reports the lyric is helping her numbness and tingling in her hands and feet. She has ran a low grade fever the last 2 times after her Velcade injection. Denies any nausea, vomiting, or diarrhea. Denies any new pains. Had not noticed any recent bleeding such as epistaxis, hematuria or hematochezia. Denies recent  chest pain on exertion, shortness of breath on minimal exertion, pre-syncopal episodes, or palpitations. Denies any numbness or tingling in hands or feet. Denies any recent fevers, infections, or recent hospitalizations. Patient reports appetite at 100% and energy level at 100%. She has her follow up appointment at [facility] on the 10th.  REVIEW OF SYSTEMS:  Review of Systems  All other systems reviewed and are negative.    PAST MEDICAL/SURGICAL HISTORY:  Past Medical History:  Diagnosis Date  . Acid reflux   . Allergic rhinitis   . Diabetes mellitus    type 2  . Gout   . Gout   . HBP (high blood pressure)   . History of kidney stones   . Migraines    Past Surgical History:  Procedure Laterality Date  . CESAREAN SECTION    . EXTRACORPOREAL SHOCK WAVE LITHOTRIPSY Left 10/10/2017   Procedure: LEFT EXTRACORPOREAL SHOCK WAVE LITHOTRIPSY (ESWL);  Surgeon: Bjorn Loser, MD;  Location: [facility];  Service: Urology;  Laterality: Left;  . EYE SURGERY    . HEMORRHOID SURGERY N/A 11/19/2012   Procedure: HEMORRHOIDECTOMY;  Surgeon: Jamesetta So, MD;  Location: [facility];  Service: General;  Laterality: N/A;  . kidney stones  1998  . LAPAROSCOPIC UNILATERAL SALPINGO OOPHERECTOMY  05/14/2012   Procedure: LAPAROSCOPIC UNILATERAL SALPINGO OOPHORECTOMY;  Surgeon: [name], MD;  Location: [facility];  Service: Gynecology;  Laterality: Right;  laparoscopic right salpingo-oophorectomy  . PARTIAL HYSTERECTOMY    . TONSILECTOMY, ADENOIDECTOMY, BILATERAL MYRINGOTOMY AND TUBES    . VESICOVAGINAL  FISTULA CLOSURE W/ TAH       SOCIAL HISTORY:  Social History   Socioeconomic History  . Marital status: Married    Spouse name: Not on file  . Number of children: Not on file  . Years of education: 64  . Highest education level: Not on file  Occupational History    Employer: [employer]  . Financial resource strain: Not on file  . Food insecurity:    Worry: Never true    Inability: Never true    . Transportation needs:    Medical: Not on file    Non-medical: Not on file  Tobacco Use  . Smoking status: Former Smoker    Last attempt to quit: 02/27/1999    Years since quitting: 19.4  . Smokeless tobacco: Never Used  . Tobacco comment: socially   Substance and Sexual Activity  . Alcohol use: No    Alcohol/week: 0.0 standard drinks  . Drug use: No  . Sexual activity: Yes  Lifestyle  . Physical activity:    Days per week: Not on file    Minutes per session: Not on file  . Stress: Not on file  Relationships  . Social connections:    Talks on phone: Not on file    Gets together: Not on file    Attends religious service: Not on file    Active member of club or organization: Not on file    Attends meetings of clubs or organizations: Not on file    Relationship status: Not on file  . Intimate partner violence:    Fear of current or ex partner: Not on file    Emotionally abused: Not on file    Physically abused: Not on file    Forced sexual activity: Not on file  Other Topics Concern  . Not on file  Social History Narrative  . Not on file    FAMILY HISTORY:  Family History  Problem Relation Age of Onset  . Arthritis Other   . Cancer Other   . Diabetes Other   . Hypertension Mother   . Dementia Mother   . Diabetes Father   . ALS Father   . Diabetes Brother   . Hypertension Brother   . Cancer Paternal Aunt   . COPD Maternal Grandmother   . Cancer Maternal Grandfather   . Anesthesia problems Paternal Grandfather     CURRENT MEDICATIONS:  Outpatient Encounter Medications as of 07/29/2018  Medication Sig Note  . acyclovir (ZOVIRAX) 800 MG tablet    . allopurinol (ZYLOPRIM) 300 MG tablet TAKE 1 TABLET BY MOUTH  DAILY FOR GOUT   . Bortezomib (VELCADE IJ) Inject as directed.   . bumetanide (BUMEX) 1 MG tablet Take 1 tablet (1 mg total) by mouth daily.   . carvedilol (COREG) 6.25 MG tablet Take by mouth.   . docusate sodium (COLACE) 100 MG capsule Take by mouth.    . fluticasone (FLONASE) 50 MCG/ACT nasal spray Place 2 sprays into the nose 2 (two) times daily.   Marland Kitchen glucose blood (PRECISION QID TEST) test strip Use 1 each (1 strip total) 4 (four) times daily Use as instructed. OneTouch Verio   . insulin glargine (LANTUS) 100 UNIT/ML injection Inject 7 Units into the skin at bedtime.   . Lancets Misc. (UNISTIK 2 NORMAL) MISC Use 1 each 4 (four) times daily Use as instructed.   Marland Kitchen lenalidomide (REVLIMID) 10 MG capsule Take for 3 weeks then take one-week off   .  losartan (COZAAR) 100 MG tablet TAKE 1 TABLET BY MOUTH  DAILY   . magnesium oxide (MAG-OX) 400 (241.3 Mg) MG tablet Take 1 tablet (400 mg total) by mouth 2 (two) times daily.   . metFORMIN (GLUCOPHAGE) 500 MG tablet TAKE 1 TABLET BY MOUTH ONCE DAILY WITH BREAKFAST   . omeprazole (PRILOSEC) 20 MG capsule TAKE 1 CAPSULE BY MOUTH  DAILY   . ONETOUCH VERIO test strip USE 1 STRIP TO CHECK GLUCOSE 4 TIMES DAILY AS DIRECTED   . potassium chloride SA (K-DUR,KLOR-CON) 20 MEQ tablet    . pregabalin (LYRICA) 200 MG capsule Take 1 capsule (200 mg total) by mouth 2 (two) times daily.   . rosuvastatin (CRESTOR) 5 MG tablet    . TRULICITY 3.84 YK/5.9DJ SOPN    . verapamil (CALAN-SR) 240 MG CR tablet TAKE 1 TABLET BY MOUTH AT  BEDTIME   . albuterol (PROVENTIL HFA;VENTOLIN HFA) 108 (90 Base) MCG/ACT inhaler Inhale 1-2 puffs into the lungs daily as needed. (Patient not taking: Reported on 07/29/2018)   . diazepam (VALIUM) 5 MG tablet TAKE 1 TABLET BY MOUTH AT BEDTIME AS NEEDED FOR ANXIETY OR INSOMNIA (Patient not taking: Reported on 07/29/2018)   . HYDROcodone-acetaminophen (NORCO/VICODIN) 5-325 MG tablet Take 1 tablet by mouth every 6 (six) hours as needed for moderate pain. (Patient not taking: Reported on 07/29/2018)   . ondansetron (ZOFRAN) 8 MG tablet Take 1 tablet (8 mg total) by mouth every 8 (eight) hours as needed. (Patient not taking: Reported on 07/29/2018)   . prochlorperazine (COMPAZINE) 10 MG tablet Take 1  tablet (10 mg total) by mouth every 6 (six) hours as needed for nausea or vomiting. (Patient not taking: Reported on 07/29/2018)   . [DISCONTINUED] Calcium Carb-Cholecalciferol (CALCIUM 600+D3 PO) Take by mouth. 11/08/2017: Calcium 682m with Vitamin D 1,000 units BID  . [DISCONTINUED] dexamethasone (DECADRON) 4 MG tablet Take 10 tablets (40 mg total) by mouth once a week.    No facility-administered encounter medications on file as of 07/29/2018.     ALLERGIES:  No Known Allergies   PHYSICAL EXAM:  ECOG Performance status: 1  Vitals:   07/29/18 1000  BP: 138/82  Pulse: 66  Resp: 16  Temp: 97.7 F (36.5 C)  SpO2: 98%   Filed Weights   07/29/18 1000  Weight: 269 lb 3 oz (122.1 kg)    Physical Exam Constitutional:      Appearance: Normal appearance. She is normal weight.  Cardiovascular:     Rate and Rhythm: Normal rate and regular rhythm.     Heart sounds: Normal heart sounds.  Pulmonary:     Effort: Pulmonary effort is normal.     Breath sounds: Normal breath sounds.  Musculoskeletal: Normal range of motion.  Skin:    General: Skin is warm and dry.  Neurological:     Mental Status: She is alert and oriented to person, place, and time. Mental status is at baseline.  Psychiatric:        Mood and Affect: Mood normal.        Behavior: Behavior normal.        Thought Content: Thought content normal.        Judgment: Judgment normal.      LABORATORY DATA:  I have reviewed the labs as listed.  CBC    Component Value Date/Time   WBC 3.1 (L) 07/29/2018 0904   RBC 3.08 (L) 07/29/2018 0904   HGB 9.7 (L) 07/29/2018 0904   HGB 14.2 08/02/2015 1113  HCT 30.3 (L) 07/29/2018 0904   HCT 41.1 08/02/2015 1113   PLT 185 07/29/2018 0904   PLT 371 08/02/2015 1113   MCV 98.4 07/29/2018 0904   MCV 84 08/02/2015 1113   MCH 31.5 07/29/2018 0904   MCHC 32.0 07/29/2018 0904   RDW 15.9 (H) 07/29/2018 0904   RDW 15.1 08/02/2015 1113   LYMPHSABS 0.7 07/29/2018 0904   LYMPHSABS  2.9 08/02/2015 1113   MONOABS 0.7 07/29/2018 0904   EOSABS 0.1 07/29/2018 0904   EOSABS 0.2 08/02/2015 1113   BASOSABS 0.1 07/29/2018 0904   BASOSABS 0.1 08/02/2015 1113   CMP Latest Ref Rng & Units 07/29/2018 07/15/2018 07/08/2018  Glucose 70 - 99 mg/dL 127(H) 118(H) 135(H)  BUN 6 - 20 mg/dL 22(H) 21(H) 25(H)  Creatinine 0.44 - 1.00 mg/dL 0.98 1.19(H) 1.25(H)  Sodium 135 - 145 mmol/L 142 139 140  Potassium 3.5 - 5.1 mmol/L 3.9 3.7 3.6  Chloride 98 - 111 mmol/L 109 104 106  CO2 22 - 32 mmol/L 24 26 26   Calcium 8.9 - 10.3 mg/dL 9.4 9.3 9.3  Total Protein 6.5 - 8.1 g/dL 6.2(L) 6.4(L) 6.4(L)  Total Bilirubin 0.3 - 1.2 mg/dL 0.7 1.0 0.5  Alkaline Phos 38 - 126 U/L 66 58 54  AST 15 - 41 U/L 22 28 29   ALT 0 - 44 U/L 23 38 37       DIAGNOSTIC IMAGING:  I have independently reviewed the scans and discussed with the patient.   I have reviewed Francene Finders, NP's note and agree with the documentation.  I personally performed a face-to-face visit, made revisions and my assessment and plan is as follows.    ASSESSMENT & PLAN:   Multiple myeloma ([place]) 1.  IgG lambda multiple myeloma, stage III, del 17p: - Presentation with lower back pain since March, presented to the emergency room on 10/25/2017 with right groin pain, CT scan showed multiple lytic lesions, multiple lytic lesions on skeletal survey - SPEP shows 1.5 g/dL of monoclonal protein, beta-2 microglobulin of 4.8, free lambda light chains of 2869, kappa by lambda ratio of less than 0, elevated LDH. 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  FISH panel shows -14, -12, 13 q.-/-13 and 17p-.  Bone marrow biopsy shows 90% plasma cells. -4 cycles of RVD from 11/01/2017 through 01/14/2018.  Labs done on 01/20/2018 showed M spike to be negative. - She was evaluated by Dr. Laverta Baltimore at [facility].  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  She was discharged home on 03/07/2018.  Her energy levels are improving. - She was restarted back on Zometa on 04/22/2018. -Most recent blood work from [facility] shows negative SPEP, immunofixation and normal free light chain and ratio. - She was started back on Zometa on 04/22/2018.  Last Zometa was on 05/12/2018.  We have switched Zometa to every 3 months. - Because of her high risk disease, consolidation 2 cycles chemotherapy followed by maintenance was recommended. - Cycle 1 of consolidation RVD on 06/11/2018.  - Cycle 2 of RVD on 07/01/2018.  She has tolerated very well.  Her neuropathy is stable. - Today we have reviewed her blood work.  She will have special myeloma labs drawn for [facility] end of this month.  She has an appointment to see Dr. Laverta Baltimore at [facility]  on 08/18/2018. -She may proceed with maintenance Velcade every other week starting today.  She will start her Revlimid 10 mg 3 weeks on 1 week off as maintenance starting today. -I will see her back in 4 weeks for follow-up.  2.  Bone strengthening agents: -She is currently on zoledronic acid every 3 months.  3.  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 -Her neuropathy is well controlled with Lyrica 200 mg twice daily.  4.  ID prophylaxis: -She will continue acyclovir twice daily.  5.  Hypomagnesemia: -She is taking magnesium 400 mg 2 tablets twice daily.  Her magnesium today is low at 1.4.  She will take extra 2 tablets today.    6.  Diabetes: - She is continuing metformin thousand milligrams twice daily. -She was recently started on Trulicity and Lantus 7 units at bedtime.      Orders placed this encounter:  No orders of the defined types were placed in this encounter.     Derek Jack, MD [place] [phone]

## 2018-07-29 NOTE — Assessment & Plan Note (Signed)
1.  IgG lambda multiple myeloma, stage III, del 17p: - Presentation with lower back pain since March, presented to the emergency room on 10/25/2017 with right groin pain, CT scan showed multiple lytic lesions, multiple lytic lesions on skeletal survey - SPEP shows 1.5 g/dL of monoclonal protein, beta-2 microglobulin of 4.8, free lambda light chains of 2869, kappa by lambda ratio of less than 0, elevated LDH. 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  FISH panel shows -14, -12, 13 q.-/-13 and 17p-.  Bone marrow biopsy shows 90% plasma cells. -4 cycles of RVD from 11/01/2017 through 01/14/2018.  Labs done on 01/20/2018 showed M spike to be negative. - She was evaluated by Dr. Laverta Baltimore at [facility].  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  She was discharged home on 03/07/2018.  Her energy levels are improving. - She was restarted back on Zometa on 04/22/2018. -Most recent blood work from [facility] shows negative SPEP, immunofixation and normal free light chain and ratio. - She was started back on Zometa on 04/22/2018.  Last Zometa was on 05/12/2018.  We have switched Zometa to every 3 months. - Because of her high risk disease, consolidation 2 cycles chemotherapy followed by maintenance was recommended. - Cycle 1 of consolidation RVD on 06/11/2018.  - Cycle 2 of RVD on 07/01/2018.  She has tolerated very well.  Her neuropathy is stable. - Today we have reviewed her blood work.  She will have special myeloma labs drawn for [facility] end of this month.  She has an appointment to see Dr. Laverta Baltimore at [facility] on 08/18/2018. -She may proceed with maintenance Velcade every other week starting today.  She will start her Revlimid 10 mg 3 weeks on 1 week off as maintenance starting today. -I will see her  back in 4 weeks for follow-up.  2.  Bone strengthening agents: -She is currently on zoledronic acid every 3 months.  3.  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 -Her neuropathy is well controlled with Lyrica 200 mg twice daily.  4.  ID prophylaxis: -She will continue acyclovir twice daily.  5.  Hypomagnesemia: -She is taking magnesium 400 mg 2 tablets twice daily.  Her magnesium today is low at 1.4.  She will take extra 2 tablets today.    6.  Diabetes: - She is continuing metformin thousand milligrams twice daily. -She was recently started on Trulicity and Lantus 7 units at bedtime.

## 2018-07-29 NOTE — Progress Notes (Signed)
Patient in room 413 is ok for treatment. We reviewed the labs. The mag is low and the patient is taking her oral mag today.

## 2018-07-29 NOTE — Patient Instructions (Signed)
[facility] Discharge Instructions for Patients Receiving Chemotherapy   Beginning January 23rd 2017 lab work for the Cancer Center will be done in the  Main lab at [facility] on 1st floor. If you have a lab appointment with the Cancer Center please come in thru the  Main Entrance and check in at the main information desk   Today you received the following chemotherapy agents Velcade injection. Follow-up as scheduled. Call clinic for any questions or concerns  To help prevent nausea and vomiting after your treatment, we encourage you to take your nausea medication   If you develop nausea and vomiting, or diarrhea that is not controlled by your medication, call the clinic.  The clinic phone number is [phone]. Office hours are Monday-Friday 8:30am-5:00pm.  BELOW ARE SYMPTOMS THAT SHOULD BE REPORTED IMMEDIATELY:  *FEVER GREATER THAN 101.0 F  *CHILLS WITH OR WITHOUT FEVER  NAUSEA AND VOMITING THAT IS NOT CONTROLLED WITH YOUR NAUSEA MEDICATION  *UNUSUAL SHORTNESS OF BREATH  *UNUSUAL BRUISING OR BLEEDING  TENDERNESS IN MOUTH AND THROAT WITH OR WITHOUT PRESENCE OF ULCERS  *URINARY PROBLEMS  *BOWEL PROBLEMS  UNUSUAL RASH Items with * indicate a potential emergency and should be followed up as soon as possible. If you have an emergency after office hours please contact your primary care physician or go to the nearest emergency department.  Please call the clinic during office hours if you have any questions or concerns.   You may also contact the Patient Navigator at [phone] should you have any questions or need assistance in obtaining follow up care.      Resources For Cancer Patients and their Caregivers ? American Cancer Society: Can assist with transportation, wigs, general needs, runs Look Good Feel Better.        1-888-227-6333 ? Cancer Care: Provides financial assistance, online support groups, medication/co-pay assistance.   1-800-813-HOPE (4673) ? [facility] Assists [place] cancer patients and their families through emotional , educational and financial support.  336-427-4357 ? [place] DSS Where to apply for food stamps, Medicaid and utility assistance. 336-342-1394 ? [facility]: Transportation to medical appointments. 336-347-2287 ? Social Security Administration: May apply for disability if have a Stage IV cancer. 336-342-7796 1-800-772-1213 ? [place] Aging, Disability and Transit Services: Assists with nutrition, care and transit needs. 336-349-2343         

## 2018-08-04 ENCOUNTER — Other Ambulatory Visit: Payer: Self-pay | Admitting: Family Medicine

## 2018-08-04 NOTE — Telephone Encounter (Signed)
Pt contacted and states that she is still taking diazepam 5 mg. Please advise

## 2018-08-04 NOTE — Telephone Encounter (Signed)
Ok times one 

## 2018-08-05 ENCOUNTER — Other Ambulatory Visit (HOSPITAL_COMMUNITY): Payer: Self-pay

## 2018-08-05 ENCOUNTER — Ambulatory Visit (HOSPITAL_COMMUNITY): Payer: Self-pay

## 2018-08-06 ENCOUNTER — Other Ambulatory Visit (HOSPITAL_COMMUNITY): Payer: Self-pay | Admitting: Hematology

## 2018-08-06 DIAGNOSIS — C9000 Multiple myeloma not having achieved remission: Secondary | ICD-10-CM

## 2018-08-08 ENCOUNTER — Other Ambulatory Visit (HOSPITAL_COMMUNITY): Payer: Self-pay | Admitting: *Deleted

## 2018-08-08 ENCOUNTER — Encounter (HOSPITAL_COMMUNITY): Payer: Self-pay | Admitting: *Deleted

## 2018-08-08 ENCOUNTER — Inpatient Hospital Stay (HOSPITAL_COMMUNITY): Payer: BLUE CROSS/BLUE SHIELD

## 2018-08-08 DIAGNOSIS — C9000 Multiple myeloma not having achieved remission: Secondary | ICD-10-CM

## 2018-08-08 MED ORDER — LENALIDOMIDE 10 MG PO CAPS: ORAL_CAPSULE | ORAL | 0 refills | Status: DC

## 2018-08-08 NOTE — Telephone Encounter (Signed)
Chart reviewed, revlimid refilled. 

## 2018-08-08 NOTE — Progress Notes (Signed)
Patient arrived at clinic today with blood draw kit provided by [facility]. They are requesting that we draw and ship the labs to them.  Directions provided and followed for lab draw.   Patient was accessed in left [facility] with 23g needle. Blood drawn without issues. Needle removed intact, bandaid applied. Patient discharged from clinic ambulatory to self.   Labs were fedex mailed to [facility] per their request.

## 2018-08-09 ENCOUNTER — Other Ambulatory Visit: Payer: Self-pay

## 2018-08-09 ENCOUNTER — Encounter (HOSPITAL_COMMUNITY): Payer: Self-pay | Admitting: Emergency Medicine

## 2018-08-09 ENCOUNTER — Emergency Department (HOSPITAL_COMMUNITY): Payer: BLUE CROSS/BLUE SHIELD

## 2018-08-09 ENCOUNTER — Observation Stay (HOSPITAL_COMMUNITY)
Admission: EM | Admit: 2018-08-09 | Discharge: 2018-08-11 | Disposition: A | Discharge status: Home / Self Care | Payer: BLUE CROSS/BLUE SHIELD | Attending: Family Medicine | Admitting: Family Medicine

## 2018-08-09 DIAGNOSIS — K219 Gastro-esophageal reflux disease without esophagitis: Secondary | ICD-10-CM | POA: Diagnosis not present

## 2018-08-09 DIAGNOSIS — M109 Gout, unspecified: Secondary | ICD-10-CM | POA: Diagnosis present

## 2018-08-09 DIAGNOSIS — D649 Anemia, unspecified: Secondary | ICD-10-CM | POA: Diagnosis present

## 2018-08-09 DIAGNOSIS — E876 Hypokalemia: Secondary | ICD-10-CM | POA: Diagnosis not present

## 2018-08-09 DIAGNOSIS — A419 Sepsis, unspecified organism: Principal | ICD-10-CM

## 2018-08-09 DIAGNOSIS — D72819 Decreased white blood cell count, unspecified: Secondary | ICD-10-CM | POA: Insufficient documentation

## 2018-08-09 DIAGNOSIS — I10 Essential (primary) hypertension: Secondary | ICD-10-CM | POA: Insufficient documentation

## 2018-08-09 DIAGNOSIS — R531 Weakness: Secondary | ICD-10-CM | POA: Diagnosis not present

## 2018-08-09 DIAGNOSIS — E119 Type 2 diabetes mellitus without complications: Secondary | ICD-10-CM | POA: Insufficient documentation

## 2018-08-09 DIAGNOSIS — E872 Acidosis, unspecified: Secondary | ICD-10-CM

## 2018-08-09 DIAGNOSIS — E860 Dehydration: Secondary | ICD-10-CM | POA: Insufficient documentation

## 2018-08-09 DIAGNOSIS — R0602 Shortness of breath: Secondary | ICD-10-CM | POA: Diagnosis not present

## 2018-08-09 DIAGNOSIS — R05 Cough: Secondary | ICD-10-CM | POA: Diagnosis not present

## 2018-08-09 DIAGNOSIS — Z87891 Personal history of nicotine dependence: Secondary | ICD-10-CM | POA: Insufficient documentation

## 2018-08-09 DIAGNOSIS — J210 Acute bronchiolitis due to respiratory syncytial virus: Secondary | ICD-10-CM | POA: Diagnosis present

## 2018-08-09 DIAGNOSIS — E785 Hyperlipidemia, unspecified: Secondary | ICD-10-CM | POA: Diagnosis present

## 2018-08-09 DIAGNOSIS — R6520 Severe sepsis without septic shock: Secondary | ICD-10-CM

## 2018-08-09 DIAGNOSIS — C9000 Multiple myeloma not having achieved remission: Secondary | ICD-10-CM | POA: Insufficient documentation

## 2018-08-09 DIAGNOSIS — R509 Fever, unspecified: Secondary | ICD-10-CM

## 2018-08-09 DIAGNOSIS — D630 Anemia in neoplastic disease: Secondary | ICD-10-CM | POA: Diagnosis not present

## 2018-08-09 HISTORY — DX: Malignant (primary) neoplasm, unspecified: C80.1

## 2018-08-09 LAB — INFLUENZA PANEL BY PCR (TYPE A & B)
Influenza A By PCR: NEGATIVE
Influenza B By PCR: NEGATIVE

## 2018-08-09 LAB — COMPREHENSIVE METABOLIC PANEL
ALK PHOS: 66 U/L (ref 38–126)
ALT: 26 U/L (ref 0–44)
AST: 33 U/L (ref 15–41)
Albumin: 4.1 g/dL (ref 3.5–5.0)
Anion gap: 11 (ref 5–15)
BUN: 16 mg/dL (ref 6–20)
CALCIUM: 8.6 mg/dL — AB (ref 8.9–10.3)
CO2: 25 mmol/L (ref 22–32)
Chloride: 102 mmol/L (ref 98–111)
Creatinine, Ser: 1.23 mg/dL — ABNORMAL HIGH (ref 0.44–1.00)
GFR calc Af Amer: 58 mL/min — ABNORMAL LOW (ref >60)
GFR, EST NON AFRICAN AMERICAN: 50 mL/min — AB (ref >60)
Glucose, Bld: 115 mg/dL — ABNORMAL HIGH (ref 70–99)
Potassium: 3.2 mmol/L — ABNORMAL LOW (ref 3.5–5.1)
Sodium: 138 mmol/L (ref 135–145)
Total Bilirubin: 0.9 mg/dL (ref 0.3–1.2)
Total Protein: 6.6 g/dL (ref 6.5–8.1)

## 2018-08-09 LAB — CBC WITH DIFFERENTIAL/PLATELET
Abs Immature Granulocytes: 0.03 10*3/uL (ref 0.00–0.07)
Basophils Absolute: 0 10*3/uL (ref 0.0–0.1)
Basophils Relative: 1 %
Eosinophils Absolute: 0.1 10*3/uL (ref 0.0–0.5)
Eosinophils Relative: 2 %
HCT: 31.5 % — ABNORMAL LOW (ref 36.0–46.0)
Hemoglobin: 10 g/dL — ABNORMAL LOW (ref 12.0–15.0)
Immature Granulocytes: 1 %
Lymphocytes Relative: 30 %
Lymphs Abs: 1.1 10*3/uL (ref 0.7–4.0)
MCH: 30.9 pg (ref 26.0–34.0)
MCHC: 31.7 g/dL (ref 30.0–36.0)
MCV: 97.2 fL (ref 80.0–100.0)
Monocytes Absolute: 0.5 10*3/uL (ref 0.1–1.0)
Monocytes Relative: 14 %
Neutro Abs: 1.9 10*3/uL (ref 1.7–7.7)
Neutrophils Relative %: 52 %
Platelets: 154 10*3/uL (ref 150–400)
RBC: 3.24 MIL/uL — AB (ref 3.87–5.11)
RDW: 15.2 % (ref 11.5–15.5)
WBC: 3.7 10*3/uL — ABNORMAL LOW (ref 4.0–10.5)
nRBC: 0 % (ref 0.0–0.2)

## 2018-08-09 LAB — URINALYSIS, ROUTINE W REFLEX MICROSCOPIC
Bilirubin Urine: NEGATIVE
Glucose, UA: NEGATIVE mg/dL
Hgb urine dipstick: NEGATIVE
Ketones, ur: NEGATIVE mg/dL
Leukocytes, UA: NEGATIVE
Nitrite: NEGATIVE
Protein, ur: NEGATIVE mg/dL
SPECIFIC GRAVITY, URINE: 1.016 (ref 1.005–1.030)
pH: 5 (ref 5.0–8.0)

## 2018-08-09 LAB — TROPONIN I: Troponin I: <0.03 ng/mL (ref <0.03)

## 2018-08-09 LAB — LACTIC ACID, PLASMA
Lactic Acid, Venous: 2.9 mmol/L (ref 0.5–1.9)
Lactic Acid, Venous: 2.6 mmol/L (ref 0.5–1.9)

## 2018-08-09 LAB — BRAIN NATRIURETIC PEPTIDE: B Natriuretic Peptide: 40 pg/mL (ref 0.0–100.0)

## 2018-08-09 MED ORDER — SODIUM CHLORIDE 0.9 % IV BOLUS
1000.0000 mL | Freq: Once | INTRAVENOUS | Status: AC
  Administered 2018-08-09: 1000 mL via INTRAVENOUS

## 2018-08-09 MED ORDER — VANCOMYCIN HCL IN DEXTROSE 1-5 GM/200ML-% IV SOLN
1000.0000 mg | INTRAVENOUS | Status: AC
  Administered 2018-08-09 – 2018-08-10 (×2): 1000 mg via INTRAVENOUS
  Filled 2018-08-09 (×2): qty 200

## 2018-08-09 MED ORDER — LACTATED RINGERS IV BOLUS
1000.0000 mL | Freq: Once | INTRAVENOUS | Status: AC
  Administered 2018-08-10: 1000 mL via INTRAVENOUS

## 2018-08-09 MED ORDER — IPRATROPIUM-ALBUTEROL 0.5-2.5 (3) MG/3ML IN SOLN
3.0000 mL | Freq: Once | RESPIRATORY_TRACT | Status: AC
  Administered 2018-08-09: 3 mL via RESPIRATORY_TRACT
  Filled 2018-08-09: qty 3

## 2018-08-09 MED ORDER — VANCOMYCIN HCL IN DEXTROSE 1-5 GM/200ML-% IV SOLN: 1000.0000 mg | Freq: Once | INTRAVENOUS | Status: DC

## 2018-08-09 MED ORDER — SODIUM CHLORIDE 0.9 % IV SOLN
2.0000 g | Freq: Once | INTRAVENOUS | Status: AC
  Administered 2018-08-09: 2 g via INTRAVENOUS
  Filled 2018-08-09: qty 2

## 2018-08-09 MED ORDER — METRONIDAZOLE IN NACL 5-0.79 MG/ML-% IV SOLN
500.0000 mg | Freq: Three times a day (TID) | INTRAVENOUS | Status: DC
  Administered 2018-08-09 – 2018-08-10 (×2): 500 mg via INTRAVENOUS
  Filled 2018-08-09 (×3): qty 100

## 2018-08-09 NOTE — ED Notes (Signed)
Date and time results received: 08/09/18 @21 :45 (use smartphrase ".now" to insert current time)  Test: lactic acid  Critical Value: 2.9 Name of Provider Notified: Dr Laverta Baltimore  Orders Received? Or Actions Taken?: none taken,

## 2018-08-09 NOTE — ED Notes (Signed)
Date and time results received: 08/09/18 2342  Test: Lactic Critical Value: 2.6  Name of Provider Notified: Olevia Bowens   Orders Received? Or Actions Taken?: n/a

## 2018-08-09 NOTE — ED Triage Notes (Signed)
Pt states she is currently receiving chemo treatments for multiple myeloma. Pt reports fever/cough/congestion since yesterday.

## 2018-08-09 NOTE — ED Notes (Signed)
resp in room  

## 2018-08-09 NOTE — ED Notes (Signed)
Blood cultures, Flu test, and urine test sent to lab

## 2018-08-09 NOTE — ED Provider Notes (Signed)
Emergency Department Provider Note   I have reviewed the triage vital signs and the nursing notes.   HISTORY  Chief Complaint Fever   HPI Joanna Reid is a 53 y.o. female with PMH of DM and MM s/p bone marrow transplant and currently on Velcade maintenance and Revlimid followed by Dr. Delton Coombes resents to the emergency department with fever, cough, body aches, weakness, and runny nose.  Symptoms began yesterday and have progressively worsened throughout the last 24 hours.  No flu contacts.  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  She notes prior history of sepsis and was concerned regarding this.  She has been compliant with her medications and follows regularly with oncology here at [facility].  She denies any vomiting or diarrhea.  No chest pain or abdominal discomfort.  Her cough is productive of clear sputum.  Denies any dysuria, hesitancy, urgency.  Past Medical History:  Diagnosis Date  . Acid reflux   . Allergic rhinitis   . Cancer ([place])    multiple myeloma  . Diabetes mellitus    type 2  . Gout   . Gout   . HBP (high blood pressure)   . History of kidney stones   . Migraines     Patient Active Problem List   Diagnosis Date Noted  . Sepsis due to undetermined organism ([place]) 08/09/2018  . Hyperlipidemia 08/09/2018  . Anemia 08/09/2018  . Leukopenia 08/09/2018  . Hypokalemia 08/09/2018  . Hypocalcemia 08/09/2018  . Lactic acidosis 08/09/2018  . Sepsis ([place]) 04/25/2018  . Goals of care, counseling/discussion 11/01/2017  . Multiple myeloma ([place]) 10/29/2017  . Hypercalcemia 10/29/2017  . Type 2 diabetes mellitus without complication, without Cadynce Garrette-term current use of insulin ([place]) 01/27/2016  . Morbid obesity due to excess calories ([place]) 01/27/2016  . Anxiety as acute reaction to exceptional stress 01/13/2015  . Attention deficit hyperactivity disorder (ADHD), 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  . Gout 01/20/2014  . GERD  (gastroesophageal reflux disease) 01/20/2014  . Essential hypertension, benign 01/20/2014  . Plantar fasciitis of left foot 09/19/2011    Past Surgical History:  Procedure Laterality Date  . CESAREAN SECTION    . EXTRACORPOREAL SHOCK WAVE LITHOTRIPSY Left 10/10/2017   Procedure: LEFT EXTRACORPOREAL SHOCK WAVE LITHOTRIPSY (ESWL);  Surgeon: Bjorn Loser, MD;  Location: [facility];  Service: Urology;  Laterality: Left;  . EYE SURGERY    . HEMORRHOID SURGERY N/A 11/19/2012   Procedure: HEMORRHOIDECTOMY;  Surgeon: Jamesetta So, MD;  Location: [facility];  Service: General;  Laterality: N/A;  . kidney stones  1998  . LAPAROSCOPIC UNILATERAL SALPINGO OOPHERECTOMY  05/14/2012   Procedure: LAPAROSCOPIC UNILATERAL SALPINGO OOPHORECTOMY;  Surgeon: [name], MD;  Location: [facility];  Service: Gynecology;  Laterality: Right;  laparoscopic right salpingo-oophorectomy  . PARTIAL HYSTERECTOMY    . TONSILECTOMY, ADENOIDECTOMY, BILATERAL MYRINGOTOMY AND TUBES    . VESICOVAGINAL FISTULA CLOSURE W/ TAH      Allergies Patient has no known allergies.  Family History  Problem Relation Age of Onset  . Arthritis Other   . Cancer Other   . Diabetes Other   . Hypertension Mother   . Dementia Mother   . Diabetes Father   . ALS Father   . Diabetes Brother   . Hypertension Brother   . Cancer Paternal Aunt   . COPD Maternal Grandmother   . Cancer Maternal Grandfather   . Anesthesia problems Paternal Grandfather     Social  History Social History   Tobacco Use  . Smoking status: Former Smoker    Last attempt to quit: 02/27/1999    Years since quitting: 19.4  . Smokeless tobacco: Never Used  . Tobacco comment: socially   Substance Use Topics  . Alcohol use: No    Alcohol/week: 0.0 standard drinks  . Drug use: No    Review of Systems  Constitutional: Positive fever and generalized weakness.  Eyes: No visual changes. ENT: No sore throat. Positive congestion.  Cardiovascular: Denies chest  pain. Respiratory: Denies shortness of breath. Positive cough.  Gastrointestinal: No abdominal pain.  No nausea, no vomiting.  No diarrhea.  No constipation. Genitourinary: Negative for dysuria. Musculoskeletal: Negative for back pain. Skin: Negative for rash. Neurological: Negative for focal weakness or numbness. Positive HA.   10-point ROS otherwise negative.  ____________________________________________   PHYSICAL EXAM:  VITAL SIGNS: ED Triage Vitals  Enc Vitals Group     BP 08/09/18 2021 (!) 136/96     Pulse Rate 08/09/18 2021 97     Resp 08/09/18 2021 18     Temp 08/09/18 2021 99.7 F (37.6 C)     Temp Source 08/09/18 2021 Oral     SpO2 08/09/18 2021 97 %     Weight 08/09/18 2024 259 lb (117.5 kg)     Height 08/09/18 2024 '5\' 4"'$  (1.626 m)     Pain Score 08/09/18 2021 7   Constitutional: Alert and oriented. Well appearing and in no acute distress. Eyes: Conjunctivae are normal.  Head: Atraumatic. Nose: No congestion/rhinnorhea. Mouth/Throat: Mucous membranes are dry.  Neck: No stridor.  Cardiovascular: Normal rate, regular rhythm. Good peripheral circulation. Grossly normal heart sounds.   Respiratory: Normal respiratory effort.  No retractions. Lungs CTAB. Gastrointestinal: Soft and nontender. No distention.  Musculoskeletal: No lower extremity tenderness nor edema. No gross deformities of extremities. Neurologic:  Normal speech and language. No gross focal neurologic deficits are appreciated.  Skin:  Skin is warm, dry and intact. No rash noted.  ____________________________________________   LABS (all labs ordered are listed, but only abnormal results are displayed)  Labs Reviewed  LACTIC ACID, PLASMA - Abnormal; Notable for the following components:      Result Value   Lactic Acid, Venous 2.9 (*)    All other components within normal limits  LACTIC ACID, PLASMA - Abnormal; Notable for the following components:   Lactic Acid, Venous 2.6 (*)    All other  components within normal limits  COMPREHENSIVE METABOLIC PANEL - Abnormal; Notable for the following components:   Potassium 3.2 (*)    Glucose, Bld 115 (*)    Creatinine, Ser 1.23 (*)    Calcium 8.6 (*)    GFR calc non Af Amer 50 (*)    GFR calc Af Amer 58 (*)    All other components within normal limits  CBC WITH DIFFERENTIAL/PLATELET - Abnormal; Notable for the following components:   WBC 3.7 (*)    RBC 3.24 (*)    Hemoglobin 10.0 (*)    HCT 31.5 (*)    All other components within normal limits  CULTURE, BLOOD (ROUTINE X 2)  CULTURE, BLOOD (ROUTINE X 2)  URINE CULTURE  RESPIRATORY PANEL BY PCR  URINALYSIS, ROUTINE W REFLEX MICROSCOPIC  TROPONIN I  BRAIN NATRIURETIC PEPTIDE  INFLUENZA PANEL BY PCR (TYPE A & B)  MAGNESIUM  PHOSPHORUS   ____________________________________________  EKG   EKG Interpretation  Date/Time:  Saturday August 09 2018 21:07:29 EST Ventricular Rate:  94  PR Interval:    QRS Duration: 75 QT Interval:  359 QTC Calculation: 449 R Axis:   56 Text Interpretation:  Sinus rhythm Borderline prolonged PR interval Low voltage, precordial leads Borderline T abnormalities, anterior leads Baseline wander in lead(s) V3 No STEMI.  Confirmed by Nanda Quinton [phone]) on 08/09/2018 10:08:24 PM       ____________________________________________  RADIOLOGY  Dg Chest 2 View  Result Date: 08/09/2018 CLINICAL DATA:  Fever, cough, and congestion since yesterday. Currently receiving chemotherapy for multiple myeloma. EXAM: CHEST - 2 VIEW COMPARISON:  04/25/2018 FINDINGS: Shallow inspiration. Cardiac enlargement. No vascular congestion, edema, or consolidation. No blunting of costophrenic angles. No pneumothorax. Old right rib fractures. Degenerative changes in the spine. Compression deformity of a midthoracic vertebra. No change. IMPRESSION: Cardiac enlargement. No evidence of active pulmonary disease. Electronically Signed   By: Lucienne Capers M.D.   On: 08/09/2018  21:22    ____________________________________________   PROCEDURES  Procedure(s) performed:   Procedures  CRITICAL CARE Performed by: Margette Fast Total critical care time: 35 minutes Critical care time was exclusive of separately billable procedures and treating other patients. Critical care was necessary to treat or prevent imminent or life-threatening deterioration. Critical care was time spent personally by me on the following activities: development of treatment plan with patient and/or surrogate as well as nursing, discussions with consultants, evaluation of patient's response to treatment, examination of patient, obtaining history from patient or surrogate, ordering and performing treatments and interventions, ordering and review of laboratory studies, ordering and review of radiographic studies, pulse oximetry and re-evaluation of patient's condition.  Nanda Quinton, MD Emergency Medicine  ____________________________________________   INITIAL IMPRESSION / ASSESSMENT AND PLAN / ED COURSE  Pertinent labs & imaging results that were available during my care of the patient were reviewed by me and considered in my medical decision making (see chart for details).  Patient presents to the emergency department for evaluation of fever, fatigue, URI symptoms.  Patient has history of a bone marrow transplant and is on immune suppression medication related to this.  She is also on chemotherapy for suppression.  He is afebrile here but has had T-max at home of 101 F.  Borderline tachycardia.  Chest x-ray shows no acute infiltrate.  Patient has elevated lactate to 2.9.  Mild leukopenia no neutropenia.  Patient appears somewhat dehydrated.  Plan for broad-spectrum antibiotics given the patient's immune compromised status along with IV fluids.  Plan for admit to follow-up cultures.   Discussed patient's case with Hosptialist, Dr. Olevia Bowens to request admission. Patient and family (if present)  updated with plan. Care transferred to Hospitalist service.  I reviewed all nursing notes, vitals, pertinent old records, EKGs, labs, imaging (as available).  ____________________________________________  FINAL CLINICAL IMPRESSION(S) / ED DIAGNOSES  Final diagnoses:  Fever, unspecified fever cause  Lactic acidosis     MEDICATIONS GIVEN DURING THIS VISIT:  Medications  metroNIDAZOLE (FLAGYL) IVPB 500 mg (500 mg Intravenous New Bag/Given 08/09/18 2243)  vancomycin (VANCOCIN) IVPB 1000 mg/200 mL premix (1,000 mg Intravenous New Bag/Given 08/09/18 2311)  lactated ringers bolus 1,000 mL (has no administration in time range)  ceFEPIme (MAXIPIME) 2 g in sodium chloride 0.9 % 100 mL IVPB (0 g Intravenous Stopped 08/09/18 2304)  sodium chloride 0.9 % bolus 1,000 mL (1,000 mLs Intravenous New Bag/Given 08/09/18 2219)  ipratropium-albuterol (DUONEB) 0.5-2.5 (3) MG/3ML nebulizer solution 3 mL (3 mLs Nebulization Given 08/09/18 2241)    Note:  This document was prepared using Dragon voice  recognition software and may include unintentional dictation errors.  Nanda Quinton, MD Emergency Medicine    Unique Searfoss, [facility], MD 08/10/18 0001

## 2018-08-09 NOTE — H&P (Signed)
History and Physical    Joanna Reid NGE:[id number] DOB: [birth year] DOA: 08/09/2018  PCP: Mikey Kirschner, MD   Patient coming from: Home.  I have personally briefly reviewed patient's old medical records in [place]  Chief Complaint: Fever.  HPI: Joanna Reid is a 53 y.o. female with medical history significant of GERD, allergic rhinitis, multiple myeloma, type 2 diabetes, gout, hypertension, urolithiasis, migraine headaches who is coming to the emergency department with complaints of 5 days of cold-like symptoms with rhinorrhea, mild sore throat, persistent cough and 2 days of recurrent fever.  She had a similar episode like this back in October.  She denies dyspnea, wheezing, hemoptysis, chest pain, palpitations, dizziness, diaphoresis, PND, orthopnea or pitting edema of the lower extremities.  Denies abdominal pain, nausea, emesis, diarrhea or constipation, melena or hematochezia.  No dysuria, frequency or hematuria.  Denies polyuria, polydipsia, polyphagia or blurred vision.  Denies skin rashes or pruritus.  ED Course: Initial vital signs temperature 99.7 F, pulse 97, respirations 18, blood pressure 136/96 mmHg and O2 sat 97% on room air.  The patient received IV fluid boluses with 3000 mL's of LR and 1000 mL of normal saline.  She received also a DuoNeb, cefepime, vancomycin and metronidazole IVPB.  Her urinalysis was normal.  Chest radiograph did not have any acute abnormality.  White count was 3.7 with 52% neutrophils, 30% lymphocytes and 14% monocytes.  Hemoglobin 10.0 g/dL and platelets 154.  Troponin and BNP were normal.  Lactic acid was 2.9 and then it was 2.6 mmol/L.  However, the patient had only received a partial amount of IV fluid bolus.  CMP showed a potassium was 3.2 mmol/L, calcium was 8.6, magnesium 1.2, creatinine 1.23 and glucose 115 mg/dL.  All other values, including hepatic tests, are within normal limits.  Influenza a and B by PCR was negative.  Review  of Systems: As per HPI otherwise 10 point review of systems negative.   Past Medical History:  Diagnosis Date  . Acid reflux   . Allergic rhinitis   . Cancer ([place])    multiple myeloma  . Diabetes mellitus    type 2  . Gout   . Gout   . HBP (high blood pressure)   . History of kidney stones   . Migraines     Past Surgical History:  Procedure Laterality Date  . CESAREAN SECTION    . EXTRACORPOREAL SHOCK WAVE LITHOTRIPSY Left 10/10/2017   Procedure: LEFT EXTRACORPOREAL SHOCK WAVE LITHOTRIPSY (ESWL);  Surgeon: Bjorn Loser, MD;  Location: [facility];  Service: Urology;  Laterality: Left;  . EYE SURGERY    . HEMORRHOID SURGERY N/A 11/19/2012   Procedure: HEMORRHOIDECTOMY;  Surgeon: Jamesetta So, MD;  Location: [facility];  Service: General;  Laterality: N/A;  . kidney stones  1998  . LAPAROSCOPIC UNILATERAL SALPINGO OOPHERECTOMY  05/14/2012   Procedure: LAPAROSCOPIC UNILATERAL SALPINGO OOPHORECTOMY;  Surgeon: [name], MD;  Location: [facility];  Service: Gynecology;  Laterality: Right;  laparoscopic right salpingo-oophorectomy  . PARTIAL HYSTERECTOMY    . TONSILECTOMY, ADENOIDECTOMY, BILATERAL MYRINGOTOMY AND TUBES    . VESICOVAGINAL FISTULA CLOSURE W/ TAH       reports that she quit smoking about 19 years ago. She has never used smokeless tobacco. She reports that she does not drink alcohol or use drugs.  No Known Allergies  Family History  Problem Relation Age of Onset  . Arthritis Other   . Cancer Other   . Diabetes Other   .  Hypertension Mother   . Dementia Mother   . Diabetes Father   . ALS Father   . Diabetes Brother   . Hypertension Brother   . Cancer Paternal Aunt   . COPD Maternal Grandmother   . Cancer Maternal Grandfather   . Anesthesia problems Paternal Grandfather    Prior to Admission medications   Medication Sig Start Date End Date Taking? Authorizing Provider  acyclovir (ZOVIRAX) 800 MG tablet  04/23/18   [provider]  albuterol (PROVENTIL  HFA;VENTOLIN HFA) 108 (90 Base) MCG/ACT inhaler Inhale 1-2 puffs into the lungs daily as needed. Patient not taking: Reported on 07/29/2018 05/27/18   Mikey Kirschner, MD  allopurinol (ZYLOPRIM) 300 MG tablet TAKE 1 TABLET BY MOUTH  DAILY FOR GOUT 07/17/18   Mikey Kirschner, MD  Bortezomib (VELCADE IJ) Inject as directed.    [provider]  bumetanide (BUMEX) 1 MG tablet Take 1 tablet (1 mg total) by mouth daily. 04/14/18   Mikey Kirschner, MD  carvedilol (COREG) 6.25 MG tablet Take by mouth. 02/28/18   [provider]  diazepam (VALIUM) 5 MG tablet TAKE 1 TABLET BY MOUTH AT BEDTIME AS NEEDED FOR ANXIETY OR INSOMNIA 08/04/18   Mikey Kirschner, MD  docusate sodium (COLACE) 100 MG capsule Take by mouth.    [provider]  fluticasone (FLONASE) 50 MCG/ACT nasal spray Place 2 sprays into the nose 2 (two) times daily.    [provider]  glucose blood (PRECISION QID TEST) test strip Use 1 each (1 strip total) 4 (four) times daily Use as instructed. OneTouch Verio 02/20/18 02/20/19  [provider]  HYDROcodone-acetaminophen (NORCO/VICODIN) 5-325 MG tablet Take 1 tablet by mouth every 6 (six) hours as needed for moderate pain. Patient not taking: Reported on 07/29/2018 04/16/18   Francene Finders L, NP-C  insulin glargine (LANTUS) 100 UNIT/ML injection Inject 7 Units into the skin at bedtime.    [provider]  Lancets Misc. (UNISTIK 2 NORMAL) MISC Use 1 each 4 (four) times daily Use as instructed. 02/20/18 02/20/19  [provider]  lenalidomide (REVLIMID) 10 MG capsule Take for 3 weeks then take one-week off 08/08/18   Derek Jack, MD  losartan (COZAAR) 100 MG tablet TAKE 1 TABLET BY MOUTH  DAILY 05/27/18   Mikey Kirschner, MD  magnesium oxide (MAG-OX) 400 (241.3 Mg) MG tablet Take 1 tablet (400 mg total) by mouth 2 (two) times daily. 06/11/18   Lockamy, Randi L, NP-C  metFORMIN (GLUCOPHAGE) 500 MG tablet TAKE 1 TABLET BY MOUTH ONCE  DAILY WITH BREAKFAST 07/21/18   Mikey Kirschner, MD  omeprazole (PRILOSEC) 20 MG capsule TAKE 1 CAPSULE BY MOUTH  DAILY 07/17/18   Mikey Kirschner, MD  ondansetron (ZOFRAN) 8 MG tablet Take 1 tablet (8 mg total) by mouth every 8 (eight) hours as needed. Patient not taking: Reported on 07/29/2018 11/01/17   Derek Jack, MD  [facility] VERIO test strip USE 1 STRIP TO CHECK GLUCOSE 4 TIMES DAILY AS DIRECTED 02/20/18   [provider]  potassium chloride SA (K-DUR,KLOR-CON) 20 MEQ tablet  04/23/18   [provider]  pregabalin (LYRICA) 200 MG capsule Take 1 capsule (200 mg total) by mouth 2 (two) times daily. 06/25/18   Derek Jack, MD  prochlorperazine (COMPAZINE) 10 MG tablet Take 1 tablet (10 mg total) by mouth every 6 (six) hours as needed for nausea or vomiting. Patient not taking: Reported on 07/29/2018 11/01/17   Derek Jack, MD  rosuvastatin (CRESTOR) 5 MG tablet  05/14/18   [provider]  TRULICITY 4.31 VQ/0.0QQ SOPN  05/14/18   [provider]  verapamil (CALAN-SR) 240 MG CR tablet TAKE 1 TABLET BY MOUTH AT  BEDTIME 07/17/18   Mikey Kirschner, MD    Physical Exam: Vitals:   08/09/18 2021 08/09/18 2024 08/09/18 2245 08/09/18 2247  BP: (!) 136/96     Pulse: 97  94   Resp: 18  15   Temp: 99.7 F (37.6 C)     TempSrc: Oral     SpO2: 97%  98% 95%  Weight:  117.5 kg    Height:  5' 4"  (1.626 m)      Constitutional: Mildly febrile, but in NAD, calm, comfortable Eyes: PERRL, lids and conjunctivae mildly injected. ENMT: Mucous membranes are mildly dry. Posterior pharynx clear of any exudate or lesions. Neck: normal, supple, no masses, no thyromegaly Respiratory: Mild wheezing bilaterally, no crackles. Normal respiratory effort. No accessory muscle use.  Cardiovascular: Regular rate and rhythm, no murmurs / rubs / gallops. No extremity edema. 2+ pedal pulses. No carotid bruits.  Abdomen: Obese, soft, no tenderness, no masses  palpated. No hepatosplenomegaly. Bowel sounds positive.  Musculoskeletal: no clubbing / cyanosis.  Good ROM, no contractures. Normal muscle tone.  Skin: no acute rashes, lesions, ulcers or induration on limited dermatological examination Neurologic: CN 2-12 grossly intact. Sensation intact, DTR normal. Strength 5/5 in all 4.  Psychiatric: Normal judgment and insight. Alert and oriented x 4. Normal mood.   Labs on Admission: I have personally reviewed following labs and imaging studies  CBC: Recent Labs  Lab 08/09/18 2057  WBC 3.7*  NEUTROABS 1.9  HGB 10.0*  HCT 31.5*  MCV 97.2  PLT 761   Basic Metabolic Panel: Recent Labs  Lab 08/09/18 2057  NA 138  K 3.2*  CL 102  CO2 25  GLUCOSE 115*  BUN 16  CREATININE 1.23*  CALCIUM 8.6*   GFR: Estimated Creatinine Clearance: 66.6 mL/min (A) (by C-G formula based on SCr of 1.23 mg/dL (H)). Liver Function Tests: Recent Labs  Lab 08/09/18 2057  AST 33  ALT 26  ALKPHOS 66  BILITOT 0.9  PROT 6.6  ALBUMIN 4.1   No results for input(s): LIPASE, AMYLASE in the last 168 hours. No results for input(s): AMMONIA in the last 168 hours. Coagulation Profile: No results for input(s): INR, PROTIME in the last 168 hours. Cardiac Enzymes: Recent Labs  Lab 08/09/18 2057  TROPONINI <0.03   BNP (last 3 results) No results for input(s): PROBNP in the last 8760 hours. HbA1C: No results for input(s): HGBA1C in the last 72 hours. CBG: No results for input(s): GLUCAP in the last 168 hours. Lipid Profile: No results for input(s): CHOL, HDL, LDLCALC, TRIG, CHOLHDL, LDLDIRECT in the last 72 hours. Thyroid Function Tests: No results for input(s): TSH, T4TOTAL, FREET4, T3FREE, THYROIDAB in the last 72 hours. Anemia Panel: No results for input(s): VITAMINB12, FOLATE, FERRITIN, TIBC, IRON, RETICCTPCT in the last 72 hours. Urine analysis:    Component Value Date/Time   COLORURINE YELLOW 08/09/2018 2100   APPEARANCEUR CLEAR 08/09/2018 2100    LABSPEC 1.016 08/09/2018 2100   PHURINE 5.0 08/09/2018 2100   GLUCOSEU NEGATIVE 08/09/2018 2100   HGBUR NEGATIVE 08/09/2018 2100   BILIRUBINUR NEGATIVE 08/09/2018 2100   BILIRUBINUR ++ 02/10/2013 [place] 08/09/2018 2100   PROTEINUR NEGATIVE 08/09/2018 2100   UROBILINOGEN 0.2 05/07/2012 0829   NITRITE NEGATIVE 08/09/2018 2100  LEUKOCYTESUR NEGATIVE 08/09/2018 2100    Radiological Exams on Admission: Dg Chest 2 View  Result Date: 08/09/2018 CLINICAL DATA:  Fever, cough, and congestion since yesterday. Currently receiving chemotherapy for multiple myeloma. EXAM: CHEST - 2 VIEW COMPARISON:  04/25/2018 FINDINGS: Shallow inspiration. Cardiac enlargement. No vascular congestion, edema, or consolidation. No blunting of costophrenic angles. No pneumothorax. Old right rib fractures. Degenerative changes in the spine. Compression deformity of a midthoracic vertebra. No change. IMPRESSION: Cardiac enlargement. No evidence of active pulmonary disease. Electronically Signed   By: Lucienne Capers M.D.   On: 08/09/2018 21:22    EKG: Independently reviewed.  Vent. rate 94 BPM PR interval * ms QRS duration 75 ms QT/QTc 359/449 ms P-R-T axes 65 56 74 Sinus rhythm Borderline prolonged PR interval Low voltage, precordial leads Borderline T abnormalities, anterior leads Baseline wander in lead(s) V3  Assessment/Plan Principal Problem:   Sepsis due to undetermined organism (HCC) Observation/telemetry. Continue supplemental oxygen. Continue IV fluids. Cefepime per pharmacy. Metronidazole 500 mg IVPB every 8 hours. Vancomycin per pharmacy. Follow-up blood culture sensitivity. Check respiratory virus panel.  Active Problems:   Lactic acidosis Continue IV fluids and IV antibiotics. Follow-up lactic acid level    Hypokalemia Replacing. Follow-up potassium level.    Hypocalcemia Corrected calcium is 8.6 mg/dL. Check vitamin D level. Check phosphorus. Consider checking  PTH.    Gout Continue allopurinol 300 mg p.o. daily    GERD (gastroesophageal reflux disease) Protonix 20 mg p.o. daily.    Essential hypertension, benign Continue carvedilol 6.25 mg p.o. twice daily. Continue losartan 100 mg p.o. daily. Monitor BP, HR, BUN, creatinine and electrolytes.    Multiple myeloma (HCC) On Revlimid. Continue treatment and follow-up per oncology team.    Hyperlipidemia Continue statin.    Anemia Anemia panel. Follow-up H&H.    Leukopenia Follow-up WBC.     DVT prophylaxis: Lovenox SQ. Code Status: Full code. Family Communication: Disposition Plan: Observation for IV fluids, IV antibiotics, culture follow-up. Consults called:  Admission status: Observation/telemetry.   Reubin Milan MD [facility]  08/09/2018, 11:10 PM

## 2018-08-10 DIAGNOSIS — A419 Sepsis, unspecified organism: Secondary | ICD-10-CM | POA: Diagnosis not present

## 2018-08-10 DIAGNOSIS — E785 Hyperlipidemia, unspecified: Secondary | ICD-10-CM

## 2018-08-10 DIAGNOSIS — J210 Acute bronchiolitis due to respiratory syncytial virus: Secondary | ICD-10-CM | POA: Diagnosis not present

## 2018-08-10 DIAGNOSIS — C9001 Multiple myeloma in remission: Secondary | ICD-10-CM

## 2018-08-10 DIAGNOSIS — I10 Essential (primary) hypertension: Secondary | ICD-10-CM | POA: Diagnosis not present

## 2018-08-10 DIAGNOSIS — E876 Hypokalemia: Secondary | ICD-10-CM

## 2018-08-10 DIAGNOSIS — E872 Acidosis: Secondary | ICD-10-CM

## 2018-08-10 DIAGNOSIS — D72819 Decreased white blood cell count, unspecified: Secondary | ICD-10-CM

## 2018-08-10 DIAGNOSIS — K219 Gastro-esophageal reflux disease without esophagitis: Secondary | ICD-10-CM

## 2018-08-10 LAB — CBC WITH DIFFERENTIAL/PLATELET
Abs Immature Granulocytes: 0.04 10*3/uL (ref 0.00–0.07)
Basophils Absolute: 0 10*3/uL (ref 0.0–0.1)
Basophils Relative: 0 %
EOS ABS: 0 10*3/uL (ref 0.0–0.5)
Eosinophils Relative: 1 %
HCT: 26.7 % — ABNORMAL LOW (ref 36.0–46.0)
Hemoglobin: 8.5 g/dL — ABNORMAL LOW (ref 12.0–15.0)
Immature Granulocytes: 1 %
Lymphocytes Relative: 27 %
Lymphs Abs: 0.8 10*3/uL (ref 0.7–4.0)
MCH: 31 pg (ref 26.0–34.0)
MCHC: 31.8 g/dL (ref 30.0–36.0)
MCV: 97.4 fL (ref 80.0–100.0)
Monocytes Absolute: 0.5 10*3/uL (ref 0.1–1.0)
Monocytes Relative: 17 %
Neutro Abs: 1.6 10*3/uL — ABNORMAL LOW (ref 1.7–7.7)
Neutrophils Relative %: 54 %
Platelets: 121 10*3/uL — ABNORMAL LOW (ref 150–400)
RBC: 2.74 MIL/uL — ABNORMAL LOW (ref 3.87–5.11)
RDW: 15.3 % (ref 11.5–15.5)
WBC: 2.9 10*3/uL — ABNORMAL LOW (ref 4.0–10.5)
nRBC: 0 % (ref 0.0–0.2)

## 2018-08-10 LAB — GLUCOSE, CAPILLARY
Glucose-Capillary: 113 mg/dL — ABNORMAL HIGH (ref 70–99)
Glucose-Capillary: 130 mg/dL — ABNORMAL HIGH (ref 70–99)
Glucose-Capillary: 134 mg/dL — ABNORMAL HIGH (ref 70–99)
Glucose-Capillary: 137 mg/dL — ABNORMAL HIGH (ref 70–99)
Glucose-Capillary: 144 mg/dL — ABNORMAL HIGH (ref 70–99)

## 2018-08-10 LAB — COMPREHENSIVE METABOLIC PANEL
ALT: 22 U/L (ref 0–44)
AST: 26 U/L (ref 15–41)
Albumin: 3.4 g/dL — ABNORMAL LOW (ref 3.5–5.0)
Alkaline Phosphatase: 54 U/L (ref 38–126)
Anion gap: 10 (ref 5–15)
BUN: 15 mg/dL (ref 6–20)
CO2: 24 mmol/L (ref 22–32)
Calcium: 8.1 mg/dL — ABNORMAL LOW (ref 8.9–10.3)
Chloride: 100 mmol/L (ref 98–111)
Creatinine, Ser: 1 mg/dL (ref 0.44–1.00)
GFR calc Af Amer: >60 mL/min (ref >60)
GFR calc non Af Amer: >60 mL/min (ref >60)
Glucose, Bld: 158 mg/dL — ABNORMAL HIGH (ref 70–99)
Potassium: 3.2 mmol/L — ABNORMAL LOW (ref 3.5–5.1)
Sodium: 134 mmol/L — ABNORMAL LOW (ref 135–145)
Total Bilirubin: 0.8 mg/dL (ref 0.3–1.2)
Total Protein: 5.6 g/dL — ABNORMAL LOW (ref 6.5–8.1)

## 2018-08-10 LAB — LACTIC ACID, PLASMA: Lactic Acid, Venous: 1.9 mmol/L (ref 0.5–1.9)

## 2018-08-10 LAB — RESPIRATORY PANEL BY PCR
Adenovirus: NOT DETECTED
Bordetella pertussis: NOT DETECTED
CORONAVIRUS OC43-RVPPCR: NOT DETECTED
Chlamydophila pneumoniae: NOT DETECTED
Coronavirus 229E: NOT DETECTED
Coronavirus HKU1: NOT DETECTED
Coronavirus NL63: NOT DETECTED
Influenza A: NOT DETECTED
Influenza B: NOT DETECTED
Metapneumovirus: NOT DETECTED
Mycoplasma pneumoniae: NOT DETECTED
Parainfluenza Virus 1: NOT DETECTED
Parainfluenza Virus 2: NOT DETECTED
Parainfluenza Virus 3: NOT DETECTED
Parainfluenza Virus 4: NOT DETECTED
Respiratory Syncytial Virus: DETECTED — AB
Rhinovirus / Enterovirus: NOT DETECTED

## 2018-08-10 LAB — PHOSPHORUS: Phosphorus: 3.1 mg/dL (ref 2.5–4.6)

## 2018-08-10 LAB — MAGNESIUM: Magnesium: 1.2 mg/dL — ABNORMAL LOW (ref 1.7–2.4)

## 2018-08-10 MED ORDER — SODIUM CHLORIDE 0.9 % IV SOLN
2.0000 g | Freq: Three times a day (TID) | INTRAVENOUS | Status: DC
  Filled 2018-08-10 (×4): qty 2

## 2018-08-10 MED ORDER — FLUTICASONE PROPIONATE 50 MCG/ACT NA SUSP
2.0000 | Freq: Two times a day (BID) | NASAL | Status: DC
  Administered 2018-08-10 – 2018-08-11 (×4): 2 via NASAL
  Filled 2018-08-10: qty 16

## 2018-08-10 MED ORDER — ENOXAPARIN SODIUM 60 MG/0.6ML ~~LOC~~ SOLN
0.5000 mg/kg | SUBCUTANEOUS | Status: DC
  Administered 2018-08-10 – 2018-08-11 (×2): 60 mg via SUBCUTANEOUS
  Filled 2018-08-10 (×2): qty 0.6

## 2018-08-10 MED ORDER — VANCOMYCIN HCL 10 G IV SOLR
1500.0000 mg | INTRAVENOUS | Status: DC
  Administered 2018-08-10: 1500 mg via INTRAVENOUS
  Filled 2018-08-10 (×2): qty 1500

## 2018-08-10 MED ORDER — ACETAMINOPHEN 325 MG PO TABS
650.0000 mg | ORAL_TABLET | Freq: Four times a day (QID) | ORAL | Status: DC | PRN
  Administered 2018-08-10: 650 mg via ORAL
  Filled 2018-08-10: qty 2

## 2018-08-10 MED ORDER — CARVEDILOL 3.125 MG PO TABS
6.2500 mg | ORAL_TABLET | Freq: Two times a day (BID) | ORAL | Status: DC
  Administered 2018-08-10 – 2018-08-11 (×3): 6.25 mg via ORAL
  Filled 2018-08-10 (×3): qty 2

## 2018-08-10 MED ORDER — ALLOPURINOL 300 MG PO TABS
300.0000 mg | ORAL_TABLET | Freq: Every morning | ORAL | Status: DC
  Administered 2018-08-10 – 2018-08-11 (×2): 300 mg via ORAL
  Filled 2018-08-10 (×2): qty 1

## 2018-08-10 MED ORDER — ONDANSETRON HCL 4 MG PO TABS: 4.0000 mg | ORAL_TABLET | Freq: Four times a day (QID) | ORAL | Status: DC | PRN

## 2018-08-10 MED ORDER — INSULIN ASPART 100 UNIT/ML ~~LOC~~ SOLN
0.0000 [IU] | Freq: Three times a day (TID) | SUBCUTANEOUS | Status: DC
  Administered 2018-08-10 (×3): 3 [IU] via SUBCUTANEOUS
  Administered 2018-08-11: 4 [IU] via SUBCUTANEOUS

## 2018-08-10 MED ORDER — DIAZEPAM 5 MG PO TABS
5.0000 mg | ORAL_TABLET | Freq: Every evening | ORAL | Status: DC | PRN
  Administered 2018-08-10 – 2018-08-11 (×2): 5 mg via ORAL
  Filled 2018-08-10 (×2): qty 1

## 2018-08-10 MED ORDER — PROCHLORPERAZINE MALEATE 5 MG PO TABS: 10.0000 mg | ORAL_TABLET | Freq: Four times a day (QID) | ORAL | Status: DC | PRN

## 2018-08-10 MED ORDER — PANTOPRAZOLE SODIUM 40 MG PO TBEC
40.0000 mg | DELAYED_RELEASE_TABLET | Freq: Every day | ORAL | Status: DC
  Administered 2018-08-10 – 2018-08-11 (×2): 40 mg via ORAL
  Filled 2018-08-10 (×2): qty 1

## 2018-08-10 MED ORDER — LOSARTAN POTASSIUM 50 MG PO TABS
100.0000 mg | ORAL_TABLET | Freq: Every morning | ORAL | Status: DC
  Administered 2018-08-10 – 2018-08-11 (×2): 100 mg via ORAL
  Filled 2018-08-10 (×2): qty 2

## 2018-08-10 MED ORDER — PREGABALIN 75 MG PO CAPS
200.0000 mg | ORAL_CAPSULE | Freq: Two times a day (BID) | ORAL | Status: DC
  Administered 2018-08-10 – 2018-08-11 (×4): 200 mg via ORAL
  Filled 2018-08-10 (×4): qty 1

## 2018-08-10 MED ORDER — POTASSIUM CHLORIDE IN NACL 40-0.9 MEQ/L-% IV SOLN
INTRAVENOUS | Status: DC
  Administered 2018-08-10 – 2018-08-11 (×2): 70 mL/h via INTRAVENOUS

## 2018-08-10 MED ORDER — MAGNESIUM OXIDE 400 (241.3 MG) MG PO TABS
400.0000 mg | ORAL_TABLET | Freq: Two times a day (BID) | ORAL | Status: DC
  Administered 2018-08-10 – 2018-08-11 (×3): 400 mg via ORAL
  Filled 2018-08-10 (×3): qty 1

## 2018-08-10 MED ORDER — DOCUSATE SODIUM 100 MG PO CAPS
100.0000 mg | ORAL_CAPSULE | Freq: Every day | ORAL | Status: DC
  Administered 2018-08-10 – 2018-08-11 (×2): 100 mg via ORAL
  Filled 2018-08-10 (×2): qty 1

## 2018-08-10 MED ORDER — LENALIDOMIDE 10 MG PO CAPS
10.0000 mg | ORAL_CAPSULE | Freq: Every day | ORAL | Status: DC
  Administered 2018-08-10 (×2): 10 mg via ORAL
  Filled 2018-08-10 (×3): qty 1

## 2018-08-10 MED ORDER — ACYCLOVIR 800 MG PO TABS
800.0000 mg | ORAL_TABLET | Freq: Two times a day (BID) | ORAL | Status: DC
  Administered 2018-08-10 – 2018-08-11 (×3): 800 mg via ORAL
  Filled 2018-08-10 (×3): qty 1

## 2018-08-10 MED ORDER — LACTATED RINGERS IV BOLUS (SEPSIS)
2000.0000 mL | Freq: Once | INTRAVENOUS | Status: AC
  Administered 2018-08-10: 1000 mL via INTRAVENOUS

## 2018-08-10 MED ORDER — SODIUM CHLORIDE 0.9 % IV SOLN
2.0000 g | INTRAVENOUS | Status: AC
  Administered 2018-08-10: 2 g via INTRAVENOUS
  Filled 2018-08-10: qty 2

## 2018-08-10 MED ORDER — ALBUTEROL SULFATE (2.5 MG/3ML) 0.083% IN NEBU
2.5000 mg | INHALATION_SOLUTION | RESPIRATORY_TRACT | Status: DC | PRN
  Administered 2018-08-10 (×2): 2.5 mg via RESPIRATORY_TRACT
  Filled 2018-08-10 (×2): qty 3

## 2018-08-10 MED ORDER — MAGNESIUM SULFATE 4 GM/100ML IV SOLN
4.0000 g | Freq: Once | INTRAVENOUS | Status: AC
  Administered 2018-08-10: 4 g via INTRAVENOUS
  Filled 2018-08-10: qty 100

## 2018-08-10 MED ORDER — ASPIRIN EC 81 MG PO TBEC
81.0000 mg | DELAYED_RELEASE_TABLET | Freq: Every day | ORAL | Status: DC
  Administered 2018-08-10 – 2018-08-11 (×2): 81 mg via ORAL
  Filled 2018-08-10 (×2): qty 1

## 2018-08-10 MED ORDER — ACETAMINOPHEN 650 MG RE SUPP: 650.0000 mg | Freq: Four times a day (QID) | RECTAL | Status: DC | PRN

## 2018-08-10 MED ORDER — DULAGLUTIDE 0.75 MG/0.5ML ~~LOC~~ SOAJ
0.7500 mg | SUBCUTANEOUS | Status: DC
  Filled 2018-08-10 (×3): qty 0.5

## 2018-08-10 MED ORDER — POTASSIUM CHLORIDE IN NACL 40-0.9 MEQ/L-% IV SOLN
INTRAVENOUS | Status: DC
  Administered 2018-08-10: 125 mL/h via INTRAVENOUS

## 2018-08-10 MED ORDER — POTASSIUM CHLORIDE CRYS ER 20 MEQ PO TBCR
20.0000 meq | EXTENDED_RELEASE_TABLET | Freq: Every day | ORAL | Status: DC
  Administered 2018-08-10: 20 meq via ORAL
  Filled 2018-08-10: qty 1

## 2018-08-10 MED ORDER — VERAPAMIL HCL ER 240 MG PO TBCR
240.0000 mg | EXTENDED_RELEASE_TABLET | Freq: Every day | ORAL | Status: DC
  Administered 2018-08-10 (×2): 240 mg via ORAL
  Filled 2018-08-10 (×2): qty 1

## 2018-08-10 MED ORDER — ALBUTEROL SULFATE (2.5 MG/3ML) 0.083% IN NEBU
2.5000 mg | INHALATION_SOLUTION | Freq: Three times a day (TID) | RESPIRATORY_TRACT | Status: DC
  Administered 2018-08-10 – 2018-08-11 (×3): 2.5 mg via RESPIRATORY_TRACT
  Filled 2018-08-10 (×3): qty 3

## 2018-08-10 MED ORDER — DOXYCYCLINE HYCLATE 100 MG PO TABS
100.0000 mg | ORAL_TABLET | Freq: Two times a day (BID) | ORAL | Status: DC
  Administered 2018-08-10 – 2018-08-11 (×2): 100 mg via ORAL
  Filled 2018-08-10 (×2): qty 1

## 2018-08-10 MED ORDER — ROSUVASTATIN CALCIUM 10 MG PO TABS
5.0000 mg | ORAL_TABLET | Freq: Every day | ORAL | Status: DC
  Administered 2018-08-10 – 2018-08-11 (×2): 5 mg via ORAL
  Filled 2018-08-10 (×2): qty 1

## 2018-08-10 MED ORDER — ONDANSETRON HCL 4 MG/2ML IJ SOLN: 4.0000 mg | Freq: Four times a day (QID) | INTRAMUSCULAR | Status: DC | PRN

## 2018-08-10 MED ORDER — POTASSIUM CHLORIDE CRYS ER 20 MEQ PO TBCR
40.0000 meq | EXTENDED_RELEASE_TABLET | Freq: Every day | ORAL | Status: DC
  Administered 2018-08-11: 40 meq via ORAL
  Filled 2018-08-10: qty 2

## 2018-08-10 MED ORDER — INSULIN GLARGINE 100 UNIT/ML ~~LOC~~ SOLN
7.0000 [IU] | Freq: Every day | SUBCUTANEOUS | Status: DC
  Administered 2018-08-10 (×2): 7 [IU] via SUBCUTANEOUS
  Filled 2018-08-10 (×3): qty 0.07

## 2018-08-10 NOTE — Progress Notes (Signed)
PROGRESS NOTE    Joanna Reid  WEX:[id number]  DOB: Jul 04, 1966  DOA: 08/09/2018 PCP: Mikey Kirschner, MD   Brief Admission Hx: 53 y.o. female with medical history significant of GERD, allergic rhinitis, multiple myeloma, type 2 diabetes, gout, hypertension, urolithiasis, migraine headaches who is coming to the emergency department with complaints of 5 days of cold-like symptoms with rhinorrhea, mild sore throat, persistent cough and 2 days of recurrent fever.  MDM/Assessment & Plan:   1. Sepsis of unknown origin - Pt is clinically improving with supportive care and will continue broad spectrum IV antibiotics with intention to de-escalated antibiotics 2/3.  Repeat CXR in AM 2/3.  Follow blood cultures.  2. Lactic acidosis - resolved with supportive therapy.  3. Hypokalemia - replacement ordered, follow BMP.  4. Hypocalcemia - Vit D and phos pending.  5. GERD - continue protonix therapy.  6. Essential hypertension - resumed home medications. Following.  7. Multiple myeloma - Outpatient follow up with oncology team.  8. HLE - resume statin therapy.  9. Anemia of neoplastic disease - Anemia panel pending, some hemodilution effect, follow CBC.  10. Leukopenia - following closely.   11. Type 2 diabetes mellitus - continue to follow CBGs.   CBG (last 3)  Recent Labs    08/10/18 0032 08/10/18 0733  GLUCAP 113* 134*     DVT prophylaxis: lovenox  Code Status: Full  Family Communication: patient  Disposition Plan: home when medically stabilized   Consultants:  n/a  Procedures:  n/a  Antimicrobials:  Cefepime 2/2 >  Metronidazole 2/2 >  Vancomycin 2/2 >   Subjective: Pt says that she is starting to feel better, she has not eaten any breakfast, she has a cough but non productive, no chest pain.   Objective: Vitals:   08/10/18 0310 08/10/18 0331 08/10/18 0542 08/10/18 0758  BP: 129/71  119/74   Pulse: 86 84 73   Resp: 18 18 18    Temp: (!) 100.5 F (38.1 C)   99.7 F (37.6 C)   TempSrc: Oral  Oral   SpO2: (!) 89% 96% 94% 94%  Weight:      Height:        Intake/Output Summary (Last 24 hours) at 08/10/2018 0959 Last data filed at 08/10/2018 0554 Gross per 24 hour  Intake 3285.75 ml  Output 550 ml  Net 2735.75 ml   Filed Weights   08/09/18 2024 08/10/18 0038  Weight: 117.5 kg 123.4 kg     REVIEW OF SYSTEMS  As per history otherwise all reviewed and reported negative  Exam:  General exam: awake, alert, NAD.  Respiratory system: BBS clear No increased work of breathing. Cardiovascular system: S1 & S2 heard. No JVD, murmurs, gallops, clicks or pedal edema. Gastrointestinal system: Abdomen is nondistended, soft and nontender. Normal bowel sounds heard. Central nervous system: Alert and oriented. No focal neurological deficits. Extremities: no CCE.  Data Reviewed: Basic Metabolic Panel: Recent Labs  Lab 08/09/18 2057 08/10/18 0514  NA 138 134*  K 3.2* 3.2*  CL 102 100  CO2 25 24  GLUCOSE 115* 158*  BUN 16 15  CREATININE 1.23* 1.00  CALCIUM 8.6* 8.1*  MG 1.2*  --   PHOS 3.1  --    Liver Function Tests: Recent Labs  Lab 08/09/18 2057 08/10/18 0514  AST 33 26  ALT 26 22  ALKPHOS 66 54  BILITOT 0.9 0.8  PROT 6.6 5.6*  ALBUMIN 4.1 3.4*   No results for input(s): LIPASE, AMYLASE in  the last 168 hours. No results for input(s): AMMONIA in the last 168 hours. CBC: Recent Labs  Lab 08/09/18 2057 08/10/18 0514  WBC 3.7* 2.9*  NEUTROABS 1.9 1.6*  HGB 10.0* 8.5*  HCT 31.5* 26.7*  MCV 97.2 97.4  PLT 154 121*   Cardiac Enzymes: Recent Labs  Lab 08/09/18 2057  TROPONINI <0.03   CBG (last 3)  Recent Labs    08/10/18 0032 08/10/18 0733  GLUCAP 113* 134*   Recent Results (from the past 240 hour(s))  Blood Culture (routine x 2)     Status: None (Preliminary result)   Collection Time: 08/09/18  8:57 PM  Result Value Ref Range Status   Specimen Description LEFT ANTECUBITAL  Final   Special Requests   Final     BOTTLES DRAWN AEROBIC AND ANAEROBIC Blood Culture adequate volume   Culture   Final    NO GROWTH < 12 HOURS Performed at [facility], [address] [place], [place] [address]    Report Status PENDING  Incomplete  Blood Culture (routine x 2)     Status: None (Preliminary result)   Collection Time: 08/09/18  9:10 PM  Result Value Ref Range Status   Specimen Description LEFT ANTECUBITAL  Final   Special Requests   Final    BOTTLES DRAWN AEROBIC AND ANAEROBIC Blood Culture adequate volume   Culture   Final    NO GROWTH < 12 HOURS Performed at [facility] [address], [place], [place] [address]    Report Status PENDING  Incomplete     Studies: Dg Chest 2 View  Result Date: 08/09/2018 CLINICAL DATA:  Fever, cough, and congestion since yesterday. Currently receiving chemotherapy for multiple myeloma. EXAM: CHEST - 2 VIEW COMPARISON:  04/25/2018 FINDINGS: Shallow inspiration. Cardiac enlargement. No vascular congestion, edema, or consolidation. No blunting of costophrenic angles. No pneumothorax. Old right rib fractures. Degenerative changes in the spine. Compression deformity of a midthoracic vertebra. No change. IMPRESSION: Cardiac enlargement. No evidence of active pulmonary disease. Electronically Signed   By: Lucienne Capers M.D.   On: 08/09/2018 21:22   Scheduled Meds: . acyclovir  800 mg Oral BID  . albuterol  2.5 mg Nebulization TID  . allopurinol  300 mg Oral q morning - 10a  . aspirin EC  81 mg Oral Daily  . carvedilol  6.25 mg Oral BID WC  . docusate sodium  100 mg Oral Daily  . [START ON 08/11/2018] Dulaglutide  0.75 mg Subcutaneous Q Mon  . enoxaparin (LOVENOX) injection  0.5 mg/kg Subcutaneous Q24H  . fluticasone  2 spray Each Nare BID  . insulin aspart  0-20 Units Subcutaneous TID WC  . insulin glargine  7 Units Subcutaneous QHS  . lenalidomide  10 mg Oral QHS  . losartan  100 mg Oral q morning - 10a  . magnesium oxide  400 mg Oral BID  . pantoprazole  40 mg Oral  Daily  . [START ON 08/11/2018] potassium chloride SA  40 mEq Oral Daily  . pregabalin  200 mg Oral BID  . rosuvastatin  5 mg Oral Daily  . verapamil  240 mg Oral QHS   Continuous Infusions: . 0.9 % NaCl with KCl 40 mEq / L 125 mL/hr (08/10/18 0916)  . ceFEPime (MAXIPIME) IV    . metronidazole 500 mg (08/10/18 0546)    Principal Problem:   Sepsis due to undetermined organism [facility]) Active Problems:   Gout   GERD (gastroesophageal reflux disease)   Essential hypertension,  benign   Multiple myeloma (HCC)   Hyperlipidemia   Anemia   Leukopenia   Hypokalemia   Hypocalcemia   Lactic acidosis   Hypomagnesemia  Time spent:   Irwin Brakeman, MD [facility] 08/10/2018, 9:59 AM    LOS: 0 days

## 2018-08-10 NOTE — Progress Notes (Signed)
Pharmacy Antibiotic Note  Joanna Reid is a 53 y.o. female admitted on 08/09/2018 with sepsis.  Pharmacy has been consulted for vancomycin and cefepime  Dosing.  Patient is being treated for Multiple Myeloma and her next treatment is scheduled for 08/12/18.  Plan: Start cefepime 2g IV q8h (febrile neutropenia dosing) Loading dose:  vancomycin 2g x1 dose in ED (1g + 1g)  Maintenance dose:  vancomycin 1.5g IV q24h  (predicted AUC~476.4  mcg*h/mL) Goal vancomycin  AUC range: 400-600   mcg*h/mL Pharmacy will continue to monitor renal function, vancomycin troughs/peaks per AUC protocol, cultures and patient progress.    Height: 5' 4"  (162.6 cm) Weight: 272 lb 0.8 oz (123.4 kg) IBW/kg (Calculated) : 54.7  Temp (24hrs), Avg:99.8 F (37.7 C), Min:99.5 F (37.5 C), Max:100.5 F (38.1 C)  Recent Labs  Lab 08/09/18 2057 08/09/18 2225 08/10/18 0514  WBC 3.7*  --  2.9*  CREATININE 1.23*  --  1.00  LATICACIDVEN 2.9* 2.6* 1.9    Estimated Creatinine Clearance: 84.4 mL/min (by C-G formula based on SCr of 1 mg/dL).    No Known Allergies  Antimicrobials this admission: 2/1 vancomycin >>  2/1 cefepime >>    Microbiology results: 2/1 BC x2:  NG <12h 2/1  UCx: sent  2/2 Resp PCR: sent    Thank you for allowing pharmacy to be a part of this patient's care.  Despina Pole 08/10/2018 11:33 AM

## 2018-08-10 NOTE — Progress Notes (Signed)
CRITICAL VALUE ALERT  Critical Value:  RSV+  Date & Time Notied:  08/10/18 1445  Provider Notified: Yes, Dr. Wynetta Emery  Orders Received/Actions taken: awaiting instructions.

## 2018-08-10 NOTE — Progress Notes (Signed)
ANTIBIOTIC CONSULT NOTE-Preliminary  Pharmacy Consult for cefepime and vancomycin Indication: sepsis  No Known Allergies  Patient Measurements: Height: _0  (162.6 cm) Weight: 272 lb 0.8 oz (123.4 kg) IBW/kg (Calculated) : 54.7 Adjusted Body Weight:   Vital Signs: Temp: 99.7 F (37.6 C) (02/02 0542) Temp Source: Oral (02/02 0542) BP: 119/74 (02/02 0542) Pulse Rate: 73 (02/02 0542)  Labs: Recent Labs    08/09/18 2057 08/10/18 0514  WBC 3.7* 2.9*  HGB 10.0* 8.5*  PLT 154 121*  CREATININE 1.23* 1.00    Estimated Creatinine Clearance: 84.4 mL/min (by C-G formula based on SCr of 1 mg/dL).  No results for input(s): VANCOTROUGH, VANCOPEAK, VANCORANDOM, GENTTROUGH, GENTPEAK, GENTRANDOM, TOBRATROUGH, TOBRAPEAK, TOBRARND, AMIKACINPEAK, AMIKACINTROU, AMIKACIN in the last 72 hours.   Microbiology: Recent Results (from the past 720 hour(s))  Blood Culture (routine x 2)     Status: None (Preliminary result)   Collection Time: 08/09/18  8:57 PM  Result Value Ref Range Status   Specimen Description LEFT ANTECUBITAL  Final   Special Requests   Final    BOTTLES DRAWN AEROBIC AND ANAEROBIC Blood Culture adequate volume   Culture   Final    NO GROWTH < 12 HOURS Performed at [facility], [address] [place], [place] [address]    Report Status PENDING  Incomplete  Blood Culture (routine x 2)     Status: None (Preliminary result)   Collection Time: 08/09/18  9:10 PM  Result Value Ref Range Status   Specimen Description LEFT ANTECUBITAL  Final   Special Requests   Final    BOTTLES DRAWN AEROBIC AND ANAEROBIC Blood Culture adequate volume   Culture   Final    NO GROWTH < 12 HOURS Performed at [facility], [address], [place], [place] [address]    Report Status PENDING  Incomplete    Medical History: Past Medical History:  Diagnosis Date  . Acid reflux   . Allergic rhinitis   . Cancer ([place])    multiple myeloma  . Diabetes mellitus    type 2  . Gout   . Gout   .  HBP (high blood pressure)   . History of kidney stones   . Migraines     Medications:  Scheduled:  . acyclovir  800 mg Oral BID  . albuterol  2.5 mg Nebulization TID  . allopurinol  300 mg Oral q morning - 10a  . aspirin EC  81 mg Oral Daily  . carvedilol  6.25 mg Oral BID WC  . docusate sodium  100 mg Oral Daily  . [START ON 08/11/2018] Dulaglutide  0.75 mg Subcutaneous Q Mon  . enoxaparin (LOVENOX) injection  0.5 mg/kg Subcutaneous Q24H  . fluticasone  2 spray Each Nare BID  . insulin aspart  0-20 Units Subcutaneous TID WC  . insulin glargine  7 Units Subcutaneous QHS  . lenalidomide  10 mg Oral QHS  . losartan  100 mg Oral q morning - 10a  . magnesium oxide  400 mg Oral BID  . pantoprazole  40 mg Oral Daily  . potassium chloride SA  20 mEq Oral Daily  . pregabalin  200 mg Oral BID  . rosuvastatin  5 mg Oral Daily  . verapamil  240 mg Oral QHS   Anti-infectives (From admission, onward)   Start     Dose/Rate Route Frequency Ordered Stop   08/10/18 1000  acyclovir (ZOVIRAX) tablet 800 mg     800 mg Oral 2 times daily 08/10/18  0108     08/10/18 0800  ceFEPIme (MAXIPIME) 2 g in sodium chloride 0.9 % 100 mL IVPB     2 g 200 mL/hr over 30 Minutes Intravenous NOW 08/10/18 0749 08/11/18 0800   08/09/18 2300  vancomycin (VANCOCIN) IVPB 1000 mg/200 mL premix     1,000 mg 200 mL/hr over 60 Minutes Intravenous Every 1 hr x 2 08/09/18 2222 08/10/18 0152   08/09/18 2200  ceFEPIme (MAXIPIME) 2 g in sodium chloride 0.9 % 100 mL IVPB     2 g 200 mL/hr over 30 Minutes Intravenous  Once 08/09/18 2159 08/09/18 2304   08/09/18 2200  metroNIDAZOLE (FLAGYL) IVPB 500 mg     500 mg 100 mL/hr over 60 Minutes Intravenous Every 8 hours 08/09/18 2159     08/09/18 2200  vancomycin (VANCOCIN) IVPB 1000 mg/200 mL premix  Status:  Discontinued     1,000 mg 200 mL/hr over 60 Minutes Intravenous  Once 08/09/18 2159 08/09/18 2222      Assessment: 53 yo female presented with cold like symptoms and  fever.  Asked to start cefepime and vancomycin for sepsis due to undetermined cause.    Goal of Therapy:  Vancomycin trough level 15-20 mcg/ml  Plan:  Preliminary review of pertinent patient information completed.  Protocol will be initiated with dose(s) of vancomycin 1 gram q1 hour x 2 doses for 2 gram loading dose and cefepime 2 grams x 1 8 hours after ED dose given.   Forestine Na clinical pharmacist will complete review during morning rounds to assess patient and finalize treatment regimen if needed.  Hanaa Payes Scarlett, RPH 08/10/2018,7:49 AM

## 2018-08-11 ENCOUNTER — Observation Stay (HOSPITAL_COMMUNITY): Payer: BLUE CROSS/BLUE SHIELD

## 2018-08-11 DIAGNOSIS — K219 Gastro-esophageal reflux disease without esophagitis: Secondary | ICD-10-CM | POA: Diagnosis not present

## 2018-08-11 DIAGNOSIS — A419 Sepsis, unspecified organism: Secondary | ICD-10-CM | POA: Diagnosis not present

## 2018-08-11 DIAGNOSIS — I10 Essential (primary) hypertension: Secondary | ICD-10-CM | POA: Diagnosis not present

## 2018-08-11 DIAGNOSIS — J9811 Atelectasis: Secondary | ICD-10-CM | POA: Diagnosis not present

## 2018-08-11 DIAGNOSIS — J210 Acute bronchiolitis due to respiratory syncytial virus: Secondary | ICD-10-CM | POA: Diagnosis not present

## 2018-08-11 DIAGNOSIS — R6520 Severe sepsis without septic shock: Secondary | ICD-10-CM | POA: Diagnosis not present

## 2018-08-11 LAB — COMPREHENSIVE METABOLIC PANEL
ALBUMIN: 3.8 g/dL (ref 3.5–5.0)
ALT: 22 U/L (ref 0–44)
AST: 26 U/L (ref 15–41)
Alkaline Phosphatase: 60 U/L (ref 38–126)
Anion gap: 7 (ref 5–15)
BUN: 15 mg/dL (ref 6–20)
CO2: 23 mmol/L (ref 22–32)
CREATININE: 0.91 mg/dL (ref 0.44–1.00)
Calcium: 8.2 mg/dL — ABNORMAL LOW (ref 8.9–10.3)
Chloride: 108 mmol/L (ref 98–111)
GFR calc Af Amer: >60 mL/min (ref >60)
GFR calc non Af Amer: >60 mL/min (ref >60)
Glucose, Bld: 161 mg/dL — ABNORMAL HIGH (ref 70–99)
Potassium: 3.9 mmol/L (ref 3.5–5.1)
Sodium: 138 mmol/L (ref 135–145)
Total Bilirubin: 0.9 mg/dL (ref 0.3–1.2)
Total Protein: 6.5 g/dL (ref 6.5–8.1)

## 2018-08-11 LAB — CBC WITH DIFFERENTIAL/PLATELET
Abs Immature Granulocytes: 0.03 10*3/uL (ref 0.00–0.07)
Basophils Absolute: 0 10*3/uL (ref 0.0–0.1)
Basophils Relative: 0 %
Eosinophils Absolute: 0.2 10*3/uL (ref 0.0–0.5)
Eosinophils Relative: 7 %
HCT: 30.8 % — ABNORMAL LOW (ref 36.0–46.0)
Hemoglobin: 9.9 g/dL — ABNORMAL LOW (ref 12.0–15.0)
Immature Granulocytes: 1 %
Lymphocytes Relative: 33 %
Lymphs Abs: 0.8 10*3/uL (ref 0.7–4.0)
MCH: 31.5 pg (ref 26.0–34.0)
MCHC: 32.1 g/dL (ref 30.0–36.0)
MCV: 98.1 fL (ref 80.0–100.0)
Monocytes Absolute: 0.4 10*3/uL (ref 0.1–1.0)
Monocytes Relative: 17 %
NEUTROS ABS: 1 10*3/uL — AB (ref 1.7–7.7)
Neutrophils Relative %: 42 %
Platelets: 132 10*3/uL — ABNORMAL LOW (ref 150–400)
RBC: 3.14 MIL/uL — ABNORMAL LOW (ref 3.87–5.11)
RDW: 15.3 % (ref 11.5–15.5)
WBC: 2.5 10*3/uL — ABNORMAL LOW (ref 4.0–10.5)
nRBC: 0 % (ref 0.0–0.2)

## 2018-08-11 LAB — URINE CULTURE: Culture: <10000 — AB

## 2018-08-11 LAB — GLUCOSE, CAPILLARY: Glucose-Capillary: 163 mg/dL — ABNORMAL HIGH (ref 70–99)

## 2018-08-11 LAB — MAGNESIUM: Magnesium: 2.1 mg/dL (ref 1.7–2.4)

## 2018-08-11 MED ORDER — AEROCHAMBER PLUS FLO-VU MISC: 0 refills | Status: DC

## 2018-08-11 MED ORDER — DOXYCYCLINE HYCLATE 100 MG PO TABS: 100.0000 mg | ORAL_TABLET | Freq: Two times a day (BID) | ORAL | 0 refills | Status: AC

## 2018-08-11 MED ORDER — DEXTROMETHORPHAN POLISTIREX ER 30 MG/5ML PO SUER: 60.0000 mg | Freq: Two times a day (BID) | ORAL | 0 refills | Status: DC | PRN

## 2018-08-11 MED ORDER — ALBUTEROL SULFATE HFA 108 (90 BASE) MCG/ACT IN AERS: 2.0000 | INHALATION_SPRAY | RESPIRATORY_TRACT | 3 refills | Status: DC | PRN

## 2018-08-11 MED ORDER — GUAIFENESIN-DM 100-10 MG/5ML PO SYRP
5.0000 mL | ORAL_SOLUTION | ORAL | Status: DC | PRN
  Administered 2018-08-11: 5 mL via ORAL
  Filled 2018-08-11: qty 5

## 2018-08-11 MED ORDER — FLUCONAZOLE 150 MG PO TABS: 150.0000 mg | ORAL_TABLET | Freq: Once | ORAL | 2 refills | Status: DC | PRN

## 2018-08-11 NOTE — Progress Notes (Signed)
Discharge instructions reviewed with patient. Given AVS and inhaler prescription. Patient aware of follow-up with PCP and Dr. Delton Coombes, states she has appointment 08/20/18 with PCP and will see Dr. Delton Coombes for treatment as scheduled and follow-up visit. She is aware prescriptions have been sent to her pharmacy. IV sites d/c'd, within normal limits. Verbalized understanding of instructions. home trulicity returned to her from pharmacy prior to discharge. Pt in stable condition awaiting her mother-in-law's arrival to take her home. Donavan Foil, RN

## 2018-08-11 NOTE — Discharge Summary (Signed)
Physician Discharge Summary  Joanna Reid GEX:[id number] DOB: Nov 09, 1965 DOA: 08/09/2018  PCP: Mikey Kirschner, MD Oncology: Dr. Delton Coombes  Admit date: 08/09/2018 Discharge date: 08/11/2018  Admitted From: Home  Disposition: Home   Recommendations for Outpatient Follow-up:  1. Follow up with PCP in 1 weeks 2. Follow up with oncology in 1 week  Discharge Condition: STABLE   CODE STATUS: FULL    Brief Hospitalization Summary: Please see all hospital notes, images, labs for full details of the hospitalization.  HPI: Joanna Reid is a 53 y.o. female with medical history significant of GERD, allergic rhinitis, multiple myeloma, type 2 diabetes, gout, hypertension, urolithiasis, migraine headaches who is coming to the emergency department with complaints of 5 days of cold-like symptoms with rhinorrhea, mild sore throat, persistent cough and 2 days of recurrent fever.  She had a similar episode like this back in October.  She denies dyspnea, wheezing, hemoptysis, chest pain, palpitations, dizziness, diaphoresis, PND, orthopnea or pitting edema of the lower extremities.  Denies abdominal pain, nausea, emesis, diarrhea or constipation, melena or hematochezia.  No dysuria, frequency or hematuria.  Denies polyuria, polydipsia, polyphagia or blurred vision.  Denies skin rashes or pruritus.  ED Course: Initial vital signs temperature 99.7 F, pulse 97, respirations 18, blood pressure 136/96 mmHg and O2 sat 97% on room air.  The patient received IV fluid boluses with 3000 mL's of LR and 1000 mL of normal saline.  She received also a DuoNeb, cefepime, vancomycin and metronidazole IVPB.  Her urinalysis was normal.  Chest radiograph did not have any acute abnormality.  White count was 3.7 with 52% neutrophils, 30% lymphocytes and 14% monocytes.  Hemoglobin 10.0 g/dL and platelets 154.  Troponin and BNP were normal.  Lactic acid was 2.9 and then it was 2.6 mmol/L.  However, the patient had only received  a partial amount of IV fluid bolus.  CMP showed a potassium was 3.2 mmol/L, calcium was 8.6, magnesium 1.2, creatinine 1.23 and glucose 115 mg/dL.  All other values, including hepatic tests, are within normal limits.  Influenza a and B by PCR was negative.  Brief Admission Hx: 53 y.o.femalewith medical history significant ofGERD, allergic rhinitis, multiple myeloma, type 2 diabetes, gout, hypertension, urolithiasis, migraine headaches who is coming to the emergency department with complaints of 5 days of cold-like symptoms with rhinorrhea, mild sore throat, persistent cough and 2 days of recurrent fever.  MDM/Assessment & Plan:   1. Sepsis - RESOLVED.  Pt is clinically improved with supportive care and will continue broad spectrum IV antibiotics with intention to de-escalated antibiotics 2/3.  Repeat CXR shows bronchitis.  Follow blood cultures.  RSV tested positive.  Finish 2 more days of doxycycline.  Fluconazone prn ordered per patient request.  2. Dehydration - treated with IV fluids.  Resolved.  3. RSV Bronchiolitis - supportive care, albuterol HFA with spacer ordered at discharge.   4. Lactic acidosis - resolved with supportive therapy.  5. Hypokalemia - Repleted with IV replacement.  6. Hypocalcemia - Repleted.  7. Hypomagnesemia - repleted.   8. GERD -  protonix therapy in hospital.  9. Essential hypertension - resumed home medications. Following.  10. Multiple myeloma - Outpatient follow up with oncology team.  11. HLE - resume statin therapy.  12. Anemia of neoplastic disease - Hg up to >9.   13. Leukopenia - secondary to viral infection RSV.   14. Type 2 diabetes mellitus - stable, follow up with PCP.   CBG (last 3)  RecentLabs(last2labs)  Recent Labs    08/10/18 0032 08/10/18 0733  GLUCAP 113* 134*      DVT prophylaxis: lovenox  Code Status: Full  Family Communication: patient  Disposition Plan:  home  Consultants:  n/a  Procedures:  n/a  Antimicrobials:  Cefepime 2/2 >2/2  Metronidazole 2/2 >2/2  Vancomycin 2/2 > 2/2  Discharge Diagnoses:  Active Problems:   Gout   GERD (gastroesophageal reflux disease)   Essential hypertension, benign   Multiple myeloma (HCC)   Hyperlipidemia   Anemia   Leukopenia   Hypokalemia   Hypocalcemia   Lactic acidosis   Hypomagnesemia   Acute bronchiolitis due to respiratory syncytial virus (RSV)   Discharge Instructions: Discharge Instructions    Call MD for:  difficulty breathing, headache or visual disturbances   Complete by:  As directed    Call MD for:  extreme fatigue   Complete by:  As directed    Call MD for:  persistant dizziness or light-headedness   Complete by:  As directed    Call MD for:  persistant nausea and vomiting   Complete by:  As directed    Call MD for:  severe uncontrolled pain   Complete by:  As directed    Increase activity slowly   Complete by:  As directed      Allergies as of 08/11/2018   No Known Allergies     Medication List    TAKE these medications   acyclovir 800 MG tablet Commonly known as:  ZOVIRAX Take 800 mg by mouth 2 (two) times daily.   aerochamber plus with mask inhaler Use as directed with albuterol inhaler   albuterol 108 (90 Base) MCG/ACT inhaler Commonly known as:  PROVENTIL HFA;VENTOLIN HFA Inhale 2 puffs into the lungs every 4 (four) hours as needed for wheezing or shortness of breath. What changed:    how much to take  when to take this  reasons to take this   allopurinol 300 MG tablet Commonly known as:  ZYLOPRIM TAKE 1 TABLET BY MOUTH  DAILY FOR GOUT What changed:  See the new instructions.   aspirin EC 81 MG tablet Take 81 mg by mouth daily.   bumetanide 1 MG tablet Commonly known as:  BUMEX Take 1 tablet (1 mg total) by mouth daily. What changed:  when to take this   carvedilol 6.25 MG tablet Commonly known as:  COREG Take 6.25 mg by  mouth 2 (two) times daily with a meal.   dextromethorphan 30 MG/5ML liquid Commonly known as:  DELSYM Take 10 mLs (60 mg total) by mouth 2 (two) times daily as needed for cough.   diazepam 5 MG tablet Commonly known as:  VALIUM TAKE 1 TABLET BY MOUTH AT BEDTIME AS NEEDED FOR ANXIETY OR INSOMNIA What changed:  See the new instructions.   docusate sodium 100 MG capsule Commonly known as:  COLACE Take 100 mg by mouth daily.   doxycycline 100 MG tablet Commonly known as:  VIBRA-TABS Take 1 tablet (100 mg total) by mouth every 12 (twelve) hours for 2 days.   fluconazole 150 MG tablet Commonly known as:  DIFLUCAN Take 1 tablet (150 mg total) by mouth once as needed for up to 1 dose (yeast infection). Repeat if needed   fluticasone 50 MCG/ACT nasal spray Commonly known as:  FLONASE Place 2 sprays into the nose 2 (two) times daily.   HUMALOG KWIKPEN 100 UNIT/ML KwikPen Generic drug:  insulin lispro Inject 2-6 Units into the skin  3 (three) times daily before meals. Per sliding scale for blood sugar levels over 200   insulin glargine 100 UNIT/ML injection Commonly known as:  LANTUS Inject 7 Units into the skin at bedtime.   lenalidomide 10 MG capsule Commonly known as:  REVLIMID Take for 3 weeks then take one-week off What changed:    how much to take  how to take this  when to take this   losartan 100 MG tablet Commonly known as:  COZAAR TAKE 1 TABLET BY MOUTH  DAILY What changed:  when to take this   magnesium oxide 400 (241.3 Mg) MG tablet Commonly known as:  MAG-OX Take 1 tablet (400 mg total) by mouth 2 (two) times daily.   metFORMIN 500 MG tablet Commonly known as:  GLUCOPHAGE TAKE 1 TABLET BY MOUTH ONCE DAILY WITH BREAKFAST What changed:  See the new instructions.   omeprazole 20 MG capsule Commonly known as:  PRILOSEC TAKE 1 CAPSULE BY MOUTH  DAILY What changed:  when to take this   ondansetron 8 MG tablet Commonly known as:  ZOFRAN Take 1 tablet (8 mg  total) by mouth every 8 (eight) hours as needed. What changed:  reasons to take this   potassium chloride SA 20 MEQ tablet Commonly known as:  K-DUR,KLOR-CON Take 20 mEq by mouth daily.   PRECISION QID TEST test strip Generic drug:  glucose blood Use 1 each (1 strip total) 4 (four) times daily Use as instructed. OneTouch Verio   pregabalin 200 MG capsule Commonly known as:  LYRICA Take 1 capsule (200 mg total) by mouth 2 (two) times daily.   prochlorperazine 10 MG tablet Commonly known as:  COMPAZINE Take 1 tablet (10 mg total) by mouth every 6 (six) hours as needed for nausea or vomiting.   rosuvastatin 5 MG tablet Commonly known as:  CRESTOR Take 5 mg by mouth daily.   TRULICITY 4.26 ST/4.1DQ Sopn Generic drug:  Dulaglutide Inject 0.75 mg into the skin every Monday.   VELCADE IJ Inject as directed every 14 (fourteen) days.   verapamil 240 MG CR tablet Commonly known as:  CALAN-SR TAKE 1 TABLET BY MOUTH AT  BEDTIME      Follow-up Information    Derek Jack, MD. Schedule an appointment as soon as possible for a visit in 1 week(s).   Specialty:  Hematology Why:  Hospital Follow Up  Contact information: [place] [address] [phone]          No Known Allergies Allergies as of 08/11/2018   No Known Allergies     Medication List    TAKE these medications   acyclovir 800 MG tablet Commonly known as:  ZOVIRAX Take 800 mg by mouth 2 (two) times daily.   aerochamber plus with mask inhaler Use as directed with albuterol inhaler   albuterol 108 (90 Base) MCG/ACT inhaler Commonly known as:  PROVENTIL HFA;VENTOLIN HFA Inhale 2 puffs into the lungs every 4 (four) hours as needed for wheezing or shortness of breath. What changed:    how much to take  when to take this  reasons to take this   allopurinol 300 MG tablet Commonly known as:  ZYLOPRIM TAKE 1 TABLET BY MOUTH  DAILY FOR GOUT What changed:  See the new instructions.    aspirin EC 81 MG tablet Take 81 mg by mouth daily.   bumetanide 1 MG tablet Commonly known as:  BUMEX Take 1 tablet (1 mg total) by mouth daily. What  changed:  when to take this   carvedilol 6.25 MG tablet Commonly known as:  COREG Take 6.25 mg by mouth 2 (two) times daily with a meal.   dextromethorphan 30 MG/5ML liquid Commonly known as:  DELSYM Take 10 mLs (60 mg total) by mouth 2 (two) times daily as needed for cough.   diazepam 5 MG tablet Commonly known as:  VALIUM TAKE 1 TABLET BY MOUTH AT BEDTIME AS NEEDED FOR ANXIETY OR INSOMNIA What changed:  See the new instructions.   docusate sodium 100 MG capsule Commonly known as:  COLACE Take 100 mg by mouth daily.   doxycycline 100 MG tablet Commonly known as:  VIBRA-TABS Take 1 tablet (100 mg total) by mouth every 12 (twelve) hours for 2 days.   fluconazole 150 MG tablet Commonly known as:  DIFLUCAN Take 1 tablet (150 mg total) by mouth once as needed for up to 1 dose (yeast infection). Repeat if needed   fluticasone 50 MCG/ACT nasal spray Commonly known as:  FLONASE Place 2 sprays into the nose 2 (two) times daily.   HUMALOG KWIKPEN 100 UNIT/ML KwikPen Generic drug:  insulin lispro Inject 2-6 Units into the skin 3 (three) times daily before meals. Per sliding scale for blood sugar levels over 200   insulin glargine 100 UNIT/ML injection Commonly known as:  LANTUS Inject 7 Units into the skin at bedtime.   lenalidomide 10 MG capsule Commonly known as:  REVLIMID Take for 3 weeks then take one-week off What changed:    how much to take  how to take this  when to take this   losartan 100 MG tablet Commonly known as:  COZAAR TAKE 1 TABLET BY MOUTH  DAILY What changed:  when to take this   magnesium oxide 400 (241.3 Mg) MG tablet Commonly known as:  MAG-OX Take 1 tablet (400 mg total) by mouth 2 (two) times daily.   metFORMIN 500 MG tablet Commonly known as:  GLUCOPHAGE TAKE 1 TABLET BY MOUTH ONCE  DAILY WITH BREAKFAST What changed:  See the new instructions.   omeprazole 20 MG capsule Commonly known as:  PRILOSEC TAKE 1 CAPSULE BY MOUTH  DAILY What changed:  when to take this   ondansetron 8 MG tablet Commonly known as:  ZOFRAN Take 1 tablet (8 mg total) by mouth every 8 (eight) hours as needed. What changed:  reasons to take this   potassium chloride SA 20 MEQ tablet Commonly known as:  K-DUR,KLOR-CON Take 20 mEq by mouth daily.   PRECISION QID TEST test strip Generic drug:  glucose blood Use 1 each (1 strip total) 4 (four) times daily Use as instructed. OneTouch Verio   pregabalin 200 MG capsule Commonly known as:  LYRICA Take 1 capsule (200 mg total) by mouth 2 (two) times daily.   prochlorperazine 10 MG tablet Commonly known as:  COMPAZINE Take 1 tablet (10 mg total) by mouth every 6 (six) hours as needed for nausea or vomiting.   rosuvastatin 5 MG tablet Commonly known as:  CRESTOR Take 5 mg by mouth daily.   TRULICITY 8.33 AS/5.0NL Sopn Generic drug:  Dulaglutide Inject 0.75 mg into the skin every Monday.   VELCADE IJ Inject as directed every 14 (fourteen) days.   verapamil 240 MG CR tablet Commonly known as:  CALAN-SR TAKE 1 TABLET BY MOUTH AT  BEDTIME       Procedures/Studies: Dg Chest 2 View  Result Date: 08/09/2018 CLINICAL DATA:  Fever, cough, and congestion since yesterday.  Currently receiving chemotherapy for multiple myeloma. EXAM: CHEST - 2 VIEW COMPARISON:  04/25/2018 FINDINGS: Shallow inspiration. Cardiac enlargement. No vascular congestion, edema, or consolidation. No blunting of costophrenic angles. No pneumothorax. Old right rib fractures. Degenerative changes in the spine. Compression deformity of a midthoracic vertebra. No change. IMPRESSION: Cardiac enlargement. No evidence of active pulmonary disease. Electronically Signed   By: Lucienne Capers M.D.   On: 08/09/2018 21:22   Dg Chest Port 1 View  Result Date: 08/11/2018 CLINICAL DATA:   Severe sepsis EXAM: PORTABLE CHEST 1 VIEW COMPARISON:  08/09/2018 FINDINGS: Cardiomegaly and aortic tortuosity. Bilateral airway thickening and mild left perihilar atelectasis. No consolidation, edema, or effusion. Prominent, rounded right hilum, present on multiple studies over years, likely vascular. IMPRESSION: 1. Bronchitic markings and mild atelectasis. 2. Cardiomegaly Electronically Signed   By: Monte Fantasia M.D.   On: 08/11/2018 05:44     Subjective: Pt says she is feeling much better, no SOB, no CP, no fever or chills, no n/v/d  Discharge Exam: Vitals:   08/10/18 2149 08/11/18 0601  BP: 110/65 122/79  Pulse: 75 62  Resp: 20   Temp: 98.3 F (36.8 C) 98.4 F (36.9 C)  SpO2: 95% 94%   Vitals:   08/10/18 1454 08/10/18 1926 08/10/18 2149 08/11/18 0601  BP: (!) 143/90  110/65 122/79  Pulse: 80  75 62  Resp: 20  20   Temp: 98.6 F (37 C)  98.3 F (36.8 C) 98.4 F (36.9 C)  TempSrc: Oral  Oral Oral  SpO2: 95% 93% 95% 94%  Weight:      Height:       General: Pt is alert, awake, not in acute distress Cardiovascular: RRR, S1/S2 +, no rubs, no gallops Respiratory: rare expiratory wheezing, no rhonchi Abdominal: Soft, NT, ND, bowel sounds + Extremities: no edema, no cyanosis   The results of significant diagnostics from this hospitalization (including imaging, microbiology, ancillary and laboratory) are listed below for reference.     Microbiology: Recent Results (from the past 240 hour(s))  Blood Culture (routine x 2)     Status: None (Preliminary result)   Collection Time: 08/09/18  8:57 PM  Result Value Ref Range Status   Specimen Description LEFT ANTECUBITAL  Final   Special Requests   Final    BOTTLES DRAWN AEROBIC AND ANAEROBIC Blood Culture adequate volume   Culture   Final    NO GROWTH 2 DAYS Performed at [facility], [address], [place], [place] [address]    Report Status PENDING  Incomplete  Blood Culture (routine x 2)     Status: None (Preliminary  result)   Collection Time: 08/09/18  9:10 PM  Result Value Ref Range Status   Specimen Description LEFT ANTECUBITAL  Final   Special Requests   Final    BOTTLES DRAWN AEROBIC AND ANAEROBIC Blood Culture adequate volume   Culture   Final    NO GROWTH 2 DAYS Performed at [facility], [address] [place], [place] [address]    Report Status PENDING  Incomplete  Respiratory Panel by PCR     Status: Abnormal   Collection Time: 08/10/18  2:00 AM  Result Value Ref Range Status   Adenovirus NOT DETECTED NOT DETECTED Final   Coronavirus 229E NOT DETECTED NOT DETECTED Final   Coronavirus HKU1 NOT DETECTED NOT DETECTED Final   Coronavirus NL63 NOT DETECTED NOT DETECTED Final   Coronavirus OC43 NOT DETECTED NOT DETECTED Final   Metapneumovirus NOT DETECTED NOT DETECTED  Final   Rhinovirus / Enterovirus NOT DETECTED NOT DETECTED Final   Influenza A NOT DETECTED NOT DETECTED Final   Influenza B NOT DETECTED NOT DETECTED Final   Parainfluenza Virus 1 NOT DETECTED NOT DETECTED Final   Parainfluenza Virus 2 NOT DETECTED NOT DETECTED Final   Parainfluenza Virus 3 NOT DETECTED NOT DETECTED Final   Parainfluenza Virus 4 NOT DETECTED NOT DETECTED Final   Respiratory Syncytial Virus DETECTED (A) NOT DETECTED Final    Comment: CRITICAL RESULT CALLED TO, READ BACK BY AND VERIFIED WITH: RN S BROWN [id number] AT 1443 BY CM    Bordetella pertussis NOT DETECTED NOT DETECTED Final   Chlamydophila pneumoniae NOT DETECTED NOT DETECTED Final   Mycoplasma pneumoniae NOT DETECTED NOT DETECTED Final     Labs: BNP (last 3 results) Recent Labs    08/09/18 2057  BNP 65.6   Basic Metabolic Panel: Recent Labs  Lab 08/09/18 2057 08/10/18 0514 08/11/18 0608  NA 138 134* 138  K 3.2* 3.2* 3.9  CL 102 100 108  CO2 25 24 23   GLUCOSE 115* 158* 161*  BUN 16 15 15   CREATININE 1.23* 1.00 0.91  CALCIUM 8.6* 8.1* 8.2*  MG 1.2*  --  2.1  PHOS 3.1  --   --    Liver Function Tests: Recent Labs  Lab  08/09/18 2057 08/10/18 0514 08/11/18 0608  AST 33 26 26  ALT 26 22 22   ALKPHOS 66 54 60  BILITOT 0.9 0.8 0.9  PROT 6.6 5.6* 6.5  ALBUMIN 4.1 3.4* 3.8   No results for input(s): LIPASE, AMYLASE in the last 168 hours. No results for input(s): AMMONIA in the last 168 hours. CBC: Recent Labs  Lab 08/09/18 2057 08/10/18 0514 08/11/18 0608  WBC 3.7* 2.9* 2.5*  NEUTROABS 1.9 1.6* 1.0*  HGB 10.0* 8.5* 9.9*  HCT 31.5* 26.7* 30.8*  MCV 97.2 97.4 98.1  PLT 154 121* 132*   Cardiac Enzymes: Recent Labs  Lab 08/09/18 2057  TROPONINI <0.03   BNP: Invalid input(s): POCBNP CBG: Recent Labs  Lab 08/10/18 0733 08/10/18 1104 08/10/18 1606 08/10/18 2146 08/11/18 0739  GLUCAP 134* 144* 130* 137* 163*   D-Dimer No results for input(s): DDIMER in the last 72 hours. Hgb A1c No results for input(s): HGBA1C in the last 72 hours. Lipid Profile No results for input(s): CHOL, HDL, LDLCALC, TRIG, CHOLHDL, LDLDIRECT in the last 72 hours. Thyroid function studies No results for input(s): TSH, T4TOTAL, T3FREE, THYROIDAB in the last 72 hours.  Invalid input(s): FREET3 Anemia work up No results for input(s): VITAMINB12, FOLATE, FERRITIN, TIBC, IRON, RETICCTPCT in the last 72 hours. Urinalysis    Component Value Date/Time   COLORURINE YELLOW 08/09/2018 2100   APPEARANCEUR CLEAR 08/09/2018 2100   LABSPEC 1.016 08/09/2018 2100   PHURINE 5.0 08/09/2018 2100   GLUCOSEU NEGATIVE 08/09/2018 2100   HGBUR NEGATIVE 08/09/2018 2100   BILIRUBINUR NEGATIVE 08/09/2018 2100   BILIRUBINUR ++ 02/10/2013 0843   KETONESUR NEGATIVE 08/09/2018 2100   PROTEINUR NEGATIVE 08/09/2018 2100   UROBILINOGEN 0.2 05/07/2012 0829   NITRITE NEGATIVE 08/09/2018 2100   LEUKOCYTESUR NEGATIVE 08/09/2018 2100   Sepsis Labs Invalid input(s): PROCALCITONIN,  WBC,  LACTICIDVEN Microbiology Recent Results (from the past 240 hour(s))  Blood Culture (routine x 2)     Status: None (Preliminary result)   Collection  Time: 08/09/18  8:57 PM  Result Value Ref Range Status   Specimen Description LEFT ANTECUBITAL  Final   Special Requests   Final  BOTTLES DRAWN AEROBIC AND ANAEROBIC Blood Culture adequate volume   Culture   Final    NO GROWTH 2 DAYS Performed at [facility], [address], [place], [place] [address]    Report Status PENDING  Incomplete  Blood Culture (routine x 2)     Status: None (Preliminary result)   Collection Time: 08/09/18  9:10 PM  Result Value Ref Range Status   Specimen Description LEFT ANTECUBITAL  Final   Special Requests   Final    BOTTLES DRAWN AEROBIC AND ANAEROBIC Blood Culture adequate volume   Culture   Final    NO GROWTH 2 DAYS Performed at [facility], [address] [place], [place] [address]    Report Status PENDING  Incomplete  Respiratory Panel by PCR     Status: Abnormal   Collection Time: 08/10/18  2:00 AM  Result Value Ref Range Status   Adenovirus NOT DETECTED NOT DETECTED Final   Coronavirus 229E NOT DETECTED NOT DETECTED Final   Coronavirus HKU1 NOT DETECTED NOT DETECTED Final   Coronavirus NL63 NOT DETECTED NOT DETECTED Final   Coronavirus OC43 NOT DETECTED NOT DETECTED Final   Metapneumovirus NOT DETECTED NOT DETECTED Final   Rhinovirus / Enterovirus NOT DETECTED NOT DETECTED Final   Influenza A NOT DETECTED NOT DETECTED Final   Influenza B NOT DETECTED NOT DETECTED Final   Parainfluenza Virus 1 NOT DETECTED NOT DETECTED Final   Parainfluenza Virus 2 NOT DETECTED NOT DETECTED Final   Parainfluenza Virus 3 NOT DETECTED NOT DETECTED Final   Parainfluenza Virus 4 NOT DETECTED NOT DETECTED Final   Respiratory Syncytial Virus DETECTED (A) NOT DETECTED Final    Comment: CRITICAL RESULT CALLED TO, READ BACK BY AND VERIFIED WITH: RN S BROWN [id number] AT 54 BY CM    Bordetella pertussis NOT DETECTED NOT DETECTED Final   Chlamydophila pneumoniae NOT DETECTED NOT DETECTED Final   Mycoplasma pneumoniae NOT DETECTED NOT DETECTED Final    Time  coordinating discharge:   SIGNED:  Irwin Brakeman, MD  [facility] 08/11/2018, 8:56 AM How to contact the [facility] Attending or Consulting provider 7A - 7P or covering provider during after hours 7P -7A, for this patient?  1. Check the care team in [facility] and look for a) attending/consulting [facility] provider listed and b) the [facility] team listed 2. Log into [email or web address] and use [facility]'s universal password to access. If you do not have the password, please contact the hospital operator. 3. Locate the [facility] provider you are looking for under [facility] and page to a number that you can be directly reached. 4. If you still have difficulty reaching the provider, please page the [facility] (Director on Call) for the Hospitalists listed on amion for assistance.

## 2018-08-11 NOTE — Discharge Instructions (Signed)
IMPORTANT INFORMATION: PAY CLOSE ATTENTION   PHYSICIAN DISCHARGE INSTRUCTIONS  Follow with Primary care provider  Mikey Kirschner, MD  and other consultants as instructed your Hospitalist Physician  SEEK MEDICAL CARE OR RETURN TO EMERGENCY ROOM IF SYMPTOMS COME BACK, WORSEN OR NEW PROBLEM DEVELOPS.   Please note: You were cared for by a hospitalist during your hospital stay. Every effort will be made to forward records to your primary care provider.  You can request that your primary care provider send for your hospital records if they have not received them.  Once you are discharged, your primary care physician will handle any further medical issues. Please note that NO REFILLS for any discharge medications will be authorized once you are discharged, as it is imperative that you return to your primary care physician (or establish a relationship with a primary care physician if you do not have one) for your post hospital discharge needs so that they can reassess your need for medications and monitor your lab values.  Please get a complete blood count and chemistry panel checked by your Primary MD at your next visit, and again as instructed by your Primary MD.  Get Medicines reviewed and adjusted: Please take all your medications with you for your next visit with your Primary MD  Laboratory/radiological data: Please request your Primary MD to go over all hospital tests and procedure/radiological results at the follow up, please ask your primary care provider to get all Hospital records sent to his/her office.  In some cases, they will be blood work, cultures and biopsy results pending at the time of your discharge. Please request that your primary care provider follow up on these results.  If you are diabetic, please bring your blood sugar readings with you to your follow up appointment with primary care.    Please call and make your follow up appointments as soon as possible.    Also Note  the following: If you experience worsening of your admission symptoms, develop shortness of breath, life threatening emergency, suicidal or homicidal thoughts you must seek medical attention immediately by calling 911 or calling your MD immediately  if symptoms less severe.  You must read complete instructions/literature along with all the possible adverse reactions/side effects for all the Medicines you take and that have been prescribed to you. Take any new Medicines after you have completely understood and accpet all the possible adverse reactions/side effects.   Do not drive when taking Pain medications or sleeping medications (Benzodiazepines)  Do not take more than prescribed Pain, Sleep and Anxiety Medications. It is not advisable to combine anxiety,sleep and pain medications without talking with your primary care practitioner  Special Instructions: If you have smoked or chewed Tobacco  in the last 2 yrs please stop smoking, stop any regular Alcohol  and or any Recreational drug use.  Wear Seat belts while driving.      Fever, Adult     A fever is an increase in your body's temperature. It often means a temperature of 100.87F (38C) or higher. Brief mild or moderate fevers often have no long-term effects. They often do not need treatment. Moderate or high fevers may make you feel uncomfortable. Sometimes, they can be a sign of a serious illness or disease. A fever that keeps coming back or that lasts a long time may cause you to lose water in your body (get dehydrated). You can take your temperature with a thermometer to see if you have a fever. Temperature  can change with:  Age.  Time of day.  Where the thermometer is put in the body. Readings may vary when the thermometer is put: ? In the mouth (oral). ? In the butt (rectal). ? In the ear (tympanic). ? Under the arm (axillary). ? On the forehead (temporal). Follow these instructions at home: Medicines  Take  over-the-counter and prescription medicines only as told by your doctor. Follow the dosing instructions carefully.  If you were prescribed an antibiotic medicine, take it as told by your doctor. Do not stop taking it even if you start to feel better. General instructions  Watch for any changes in your symptoms. Tell your doctor about them.  Rest as needed.  Drink enough fluid to keep your pee (urine) pale yellow.  Sponge yourself or bathe with room-temperature water as needed. This helps to lower your body temperature. Do not use ice water.  Do not use too many blankets or wear clothes that are too heavy.  If your fever was caused by an infection that spreads from person to person (is contagious), such as a cold or the flu: ? You should stay home from work and public places for at least 24 hours after your fever is gone. ? Your fever should be gone for at least 24 hours without the need to use medicines. Contact a doctor if:  You throw up (vomit).  You cannot eat or drink without throwing up.  You have watery poop (diarrhea).  It hurts when you pee.  Your symptoms do not get better with treatment.  You have new symptoms.  You feel very weak. Get help right away if:  You are short of breath or have trouble breathing.  You are dizzy or you pass out (faint).  You feel mixed up (confused).  You have signs of not having enough water in your body, such as: ? Dark pee, very little pee, or no pee. ? Cracked lips. ? Dry mouth. ? Sunken eyes. ? Sleepiness. ? Weakness.  You have very bad pain in your belly (abdomen).  You keep throwing up or having watery poop.  You have a rash on your skin.  Your symptoms get worse all of a sudden. Summary  A fever is an increase in your body's temperature. It often means a temperature of 100.27F (38C) or higher.  Watch for any changes in your symptoms. Tell your doctor about them.  Take all medicines only as told by your  doctor.  Do not go to work or other public places if your fever was caused by an illness that can spread to other people.  Get help right away if you have signs that you do not have enough water in your body. This information is not intended to replace advice given to you by your health care provider. Make sure you discuss any questions you have with your health care provider. Document Released: 04/03/2008 Document Revised: 12/09/2017 Document Reviewed: 12/09/2017 Elsevier Interactive Patient Education  2019 [employer].   Antibiotic Medicine, Adult  Antibiotic medicines are used to treat infections caused by bacteria, such as strep throat and urinary tract infection (UTI). Antibiotic medicines will not work for viral illnesses, such as colds or the flu (influenza). They work by killing the bacteria that is making you sick. Antibiotics can also have serious side effects. It is important that you take antibiotic medicines safely and only when needed. When do I need to take antibiotics? Antibiotics are medicines that treat bacterial infections. You  may need antibiotics for:  UTI.  Strep throat.  Meningitis. This infection affects the spinal cord and brain.  Bacterial sinusitis.  Serious lung infection. You may start antibiotics while your health care provider waits for test results to come back. Common tests may include throat, urine, blood, or mucus culture. Your health care provider may change or stop the antibiotic depending on your test results. When are antibiotics not needed? You do not need antibiotics for most common illnesses. These illnesses may be caused by a virus, not a bacteria. You do not need antibiotics for:  The common cold.  Influenza.  Sore throat.  Discolored mucus.  Bronchitis. Antibiotics are not always needed for all bacterial infections. Many of these infections clear up without antibiotic treatment. Do not ask for or take antibiotics when they are not  necessary. How long should I take the antibiotic? You must take the entire prescription. Continue to take your antibiotic for as long as told by your health care provider. Do not stop taking it even if you start to feel better. If you stop taking it too soon:  You may start to feel sick again.  Your infection may become harder to treat.  Complications may develop. Each course of antibiotics needs a different amount of time to work. Some antibiotic courses last only a few days. Some last about a week to 10 days. In some cases, you may need to take antibiotics for a few weeks to completely treat the infection. What if I miss a dose? Try not to miss any doses of medicine. If you miss a dose, call your health care provider or pharmacist for advice. Sometimes it is okay to take the missed dose as soon as possible. What are the risks of taking antibiotics? Most antibiotics can cause an infection called Clostridioides difficile (C. difficile or C. diff), which causes severe diarrhea. This infection happens when the antibiotics kill the healthy bacteria in your intestines. This allows C. diff to grow. The infection needs to be treated right away. Let your health care provider know if:  You have diarrhea while taking an antibiotic.  You have diarrhea after you stop taking an antibiotic. C. diff infection can start weeks after stopping the antibiotic. Taking an antibiotic also puts you at risk for getting a bacteria that does not respond to medicine (antibiotic-resistant infection) in the future. Antibiotics can cause bacteria to change so that if the antibiotic is taken again, the medicine is not able to kill the bacteria. These infections can be more serious and, in some cases, life-threatening. Do antibiotics affect birth control? Birth control pills may not work while you are on antibiotics. If you are taking birth control pills, continue taking them as usual and use a second form of birth control,  such as a condom, to avoid unwanted pregnancy. Continue using the second form of birth control until your health care provider says you can stop. What else should I know about taking antibiotics? It is important for you to take antibiotics exactly as told. Make sure that you:  Take the entire course of antibiotic that was prescribed. Do not stop taking your antibiotics even if your symptoms improve.  Take the correct amount of medicine each day.  Ask your health care provider: ? How long to wait in between doses. ? If the antibiotic should be taken with food. ? If there are any foods, drinks, or medicines that you should avoid while taking the antibiotics. ? If there are  any side effects you should be aware of.  Only use the antibiotics prescribed for you by your health care provider. Do not use antibiotics prescribed for someone else.  Drink a large glass of water along with the antibiotics.  Ask the pharmacist for a syringe, cup, or spoon that properly measures the antibiotics.  Throw away any leftover medicine. Contact a health care provider if:  Your symptoms get worse.  You have new joint pain or muscle aches that begin after starting the antibiotic. When should I seek immediate medical care?  You have signs of a serious allergic reaction to antibiotics. If you have signs of a severe allergic reaction, stop taking the antibiotic right away. Signs may include: ? Hives, which are raised, itchy, red bumps on the skin. ? Skin rash. ? Trouble breathing. ? A wheezing sound when you breathe. ? Swelling anywhere on your body. ? Feeling dizzy. ? Vomiting.  Your urine turns dark or becomes blood-colored.  Your skin turns yellow.  You bruise or bleed easily.  You have severe diarrhea and abdominal cramps.  You have a severe headache. Summary  Antibiotic medicines are used to treat infections caused by bacteria, such as strep throat and UTIs. It is important that you take  antibiotic medicines only when needed.  Your health care provider may change or stop the antibiotic depending on your test results.  Most antibiotics can cause an infection called Clostridioides difficile (C. difficile or C. diff), which causes severe diarrhea. Let your health care provider know if you develop diarrhea while taking an antibiotic.  Take the entire course of antibiotic that was prescribed. This information is not intended to replace advice given to you by your health care provider. Make sure you discuss any questions you have with your health care provider. Document Released: 03/07/2004 Document Revised: 12/24/2017 Document Reviewed: 06/26/2016 Elsevier Interactive Patient Education  2019 [employer].

## 2018-08-12 ENCOUNTER — Ambulatory Visit (HOSPITAL_COMMUNITY): Payer: Self-pay

## 2018-08-12 ENCOUNTER — Other Ambulatory Visit (HOSPITAL_COMMUNITY): Payer: Self-pay

## 2018-08-13 ENCOUNTER — Other Ambulatory Visit (HOSPITAL_COMMUNITY): Payer: Self-pay

## 2018-08-13 ENCOUNTER — Ambulatory Visit (HOSPITAL_COMMUNITY): Payer: Self-pay

## 2018-08-14 ENCOUNTER — Other Ambulatory Visit (HOSPITAL_COMMUNITY): Payer: Self-pay | Admitting: *Deleted

## 2018-08-14 LAB — CULTURE, BLOOD (ROUTINE X 2)
Culture: NO GROWTH
Culture: NO GROWTH
SPECIAL REQUESTS: ADEQUATE
Special Requests: ADEQUATE

## 2018-08-18 DIAGNOSIS — Z9484 Stem cells transplant status: Secondary | ICD-10-CM | POA: Diagnosis not present

## 2018-08-18 DIAGNOSIS — C9001 Multiple myeloma in remission: Secondary | ICD-10-CM | POA: Diagnosis not present

## 2018-08-18 DIAGNOSIS — I119 Hypertensive heart disease without heart failure: Secondary | ICD-10-CM | POA: Diagnosis not present

## 2018-08-18 DIAGNOSIS — E119 Type 2 diabetes mellitus without complications: Secondary | ICD-10-CM | POA: Diagnosis not present

## 2018-08-18 DIAGNOSIS — I071 Rheumatic tricuspid insufficiency: Secondary | ICD-10-CM | POA: Diagnosis not present

## 2018-08-18 DIAGNOSIS — Q2546 Tortuous aortic arch: Secondary | ICD-10-CM | POA: Diagnosis not present

## 2018-08-18 DIAGNOSIS — R05 Cough: Secondary | ICD-10-CM | POA: Diagnosis not present

## 2018-08-18 DIAGNOSIS — C9000 Multiple myeloma not having achieved remission: Secondary | ICD-10-CM | POA: Diagnosis not present

## 2018-08-18 DIAGNOSIS — M109 Gout, unspecified: Secondary | ICD-10-CM | POA: Diagnosis not present

## 2018-08-18 DIAGNOSIS — I517 Cardiomegaly: Secondary | ICD-10-CM | POA: Diagnosis not present

## 2018-08-18 DIAGNOSIS — I10 Essential (primary) hypertension: Secondary | ICD-10-CM | POA: Diagnosis not present

## 2018-08-18 DIAGNOSIS — E785 Hyperlipidemia, unspecified: Secondary | ICD-10-CM | POA: Diagnosis not present

## 2018-08-18 DIAGNOSIS — Z794 Long term (current) use of insulin: Secondary | ICD-10-CM | POA: Diagnosis not present

## 2018-08-18 DIAGNOSIS — E1165 Type 2 diabetes mellitus with hyperglycemia: Secondary | ICD-10-CM | POA: Diagnosis not present

## 2018-08-18 DIAGNOSIS — T380X5D Adverse effect of glucocorticoids and synthetic analogues, subsequent encounter: Secondary | ICD-10-CM | POA: Diagnosis not present

## 2018-08-18 DIAGNOSIS — Z6841 Body Mass Index (BMI) 40.0 and over, adult: Secondary | ICD-10-CM | POA: Diagnosis not present

## 2018-08-18 DIAGNOSIS — E782 Mixed hyperlipidemia: Secondary | ICD-10-CM | POA: Diagnosis not present

## 2018-08-20 ENCOUNTER — Ambulatory Visit: Payer: BLUE CROSS/BLUE SHIELD | Admitting: Family Medicine

## 2018-08-20 ENCOUNTER — Other Ambulatory Visit: Payer: Self-pay | Admitting: Family Medicine

## 2018-08-20 ENCOUNTER — Encounter: Payer: Self-pay | Admitting: Family Medicine

## 2018-08-20 VITALS — BP 130/84 | Temp 98.1°F | Ht 64.0 in | Wt 268.0 lb

## 2018-08-20 DIAGNOSIS — R300 Dysuria: Secondary | ICD-10-CM

## 2018-08-20 DIAGNOSIS — I10 Essential (primary) hypertension: Secondary | ICD-10-CM

## 2018-08-20 DIAGNOSIS — Z1211 Encounter for screening for malignant neoplasm of colon: Secondary | ICD-10-CM | POA: Diagnosis not present

## 2018-08-20 DIAGNOSIS — Z23 Encounter for immunization: Secondary | ICD-10-CM

## 2018-08-20 LAB — POCT URINALYSIS DIPSTICK
Blood, UA: 250
Nitrite, UA: POSITIVE
Protein, UA: POSITIVE — AB
Spec Grav, UA: 1.02 (ref 1.010–1.025)
pH, UA: 5 (ref 5.0–8.0)

## 2018-08-20 MED ORDER — NITROFURANTOIN MONOHYD MACRO 100 MG PO CAPS: 100.0000 mg | ORAL_CAPSULE | Freq: Two times a day (BID) | ORAL | 0 refills | Status: DC

## 2018-08-20 NOTE — Progress Notes (Signed)
   Subjective:    Patient ID: Joanna Reid, female    DOB: 10-Jul-1965, 53 y.o.   MRN: [id number]  HPIHospitalization follow up.   Wants to get screening colonoscopy.   Dysuria started 2 days ago.   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    Has ben on a lot of steroids and required the hualog, now on just lantus at night   incr uri freq, burning and noted odor   Pt had hx of kid stone, the chemo evaporated it   Now relsolved per urology    Three yrs behind on colonoxopy   Pos dysuria, and incr freq, some odor    Dr Laural Golden o see fo r colon   Needs  Flu shot , toay    Full hospital work-up and management notes reviewed today    Review of Systems No headache no chest pain no back pain    Objective:   Physical Exam   Alert and oriented, vitals reviewed and stable, NAD ENT-TM's and ext canals WNL bilat via otoscopic exam Soft palate, tonsils and post pharynx WNL via oropharyngeal exam Neck-symmetric, no masses; thyroid nonpalpable and nontender Pulmonary-no tachypnea or accessory muscle use; Clear without wheezes via auscultation Card--no abnrml murmurs, rhythm reg and rate WNL Carotid pulses symmetric, without bruits      Assessment & Plan:  Impression 1 status post hospitalization for respiratory syncytial virus and secondary symptoms.  Also treated for potential sepsis due to postchemotherapy status.  Cultures returned negative.  Feeling overall better  2.  UTI.  Urine numerous white blood cells Macrobid initiated will culture  3.  Type 2 diabetes now followed by a specialist  4.  Has not had a colonoscopy would like to get on with 1 discussed we will initiate/flu shot today per patient request  Greater than 50% of this 25 minute face to face visit was spent in counseling and discussion and  coordination of care regarding the above diagnosis/diagnosies

## 2018-08-21 ENCOUNTER — Inpatient Hospital Stay (HOSPITAL_COMMUNITY): Payer: BLUE CROSS/BLUE SHIELD | Attending: Hematology

## 2018-08-21 ENCOUNTER — Other Ambulatory Visit (HOSPITAL_COMMUNITY): Payer: Self-pay | Admitting: Nurse Practitioner

## 2018-08-21 ENCOUNTER — Inpatient Hospital Stay (HOSPITAL_BASED_OUTPATIENT_CLINIC_OR_DEPARTMENT_OTHER): Payer: BLUE CROSS/BLUE SHIELD | Admitting: Hematology

## 2018-08-21 ENCOUNTER — Encounter (HOSPITAL_COMMUNITY): Payer: Self-pay | Admitting: Hematology

## 2018-08-21 ENCOUNTER — Other Ambulatory Visit: Payer: Self-pay

## 2018-08-21 ENCOUNTER — Inpatient Hospital Stay (HOSPITAL_COMMUNITY): Payer: BLUE CROSS/BLUE SHIELD

## 2018-08-21 VITALS — BP 142/87 | HR 72 | Temp 98.1°F | Resp 16 | Wt 266.5 lb

## 2018-08-21 DIAGNOSIS — C9000 Multiple myeloma not having achieved remission: Secondary | ICD-10-CM

## 2018-08-21 DIAGNOSIS — Z5112 Encounter for antineoplastic immunotherapy: Secondary | ICD-10-CM | POA: Insufficient documentation

## 2018-08-21 DIAGNOSIS — Z9484 Stem cells transplant status: Secondary | ICD-10-CM

## 2018-08-21 DIAGNOSIS — G629 Polyneuropathy, unspecified: Secondary | ICD-10-CM | POA: Diagnosis not present

## 2018-08-21 DIAGNOSIS — Z794 Long term (current) use of insulin: Secondary | ICD-10-CM

## 2018-08-21 DIAGNOSIS — E119 Type 2 diabetes mellitus without complications: Secondary | ICD-10-CM | POA: Diagnosis not present

## 2018-08-21 LAB — COMPREHENSIVE METABOLIC PANEL
ALK PHOS: 60 U/L (ref 38–126)
ALT: 18 U/L (ref 0–44)
AST: 24 U/L (ref 15–41)
Albumin: 4 g/dL (ref 3.5–5.0)
Anion gap: 10 (ref 5–15)
BUN: 25 mg/dL — ABNORMAL HIGH (ref 6–20)
CALCIUM: 9.2 mg/dL (ref 8.9–10.3)
CO2: 23 mmol/L (ref 22–32)
Chloride: 106 mmol/L (ref 98–111)
Creatinine, Ser: 1.07 mg/dL — ABNORMAL HIGH (ref 0.44–1.00)
GFR calc Af Amer: >60 mL/min (ref >60)
GFR calc non Af Amer: 59 mL/min — ABNORMAL LOW (ref >60)
Glucose, Bld: 174 mg/dL — ABNORMAL HIGH (ref 70–99)
Potassium: 4.1 mmol/L (ref 3.5–5.1)
Sodium: 139 mmol/L (ref 135–145)
Total Bilirubin: 0.6 mg/dL (ref 0.3–1.2)
Total Protein: 6.8 g/dL (ref 6.5–8.1)

## 2018-08-21 LAB — CBC WITH DIFFERENTIAL/PLATELET
Abs Immature Granulocytes: 0 10*3/uL (ref 0.00–0.07)
BASOS ABS: 0 10*3/uL (ref 0.0–0.1)
Basophils Relative: 1 %
Eosinophils Absolute: 0.1 10*3/uL (ref 0.0–0.5)
Eosinophils Relative: 3 %
HCT: 29.8 % — ABNORMAL LOW (ref 36.0–46.0)
Hemoglobin: 9.4 g/dL — ABNORMAL LOW (ref 12.0–15.0)
Immature Granulocytes: 0 %
Lymphocytes Relative: 38 %
Lymphs Abs: 1.2 10*3/uL (ref 0.7–4.0)
MCH: 31.2 pg (ref 26.0–34.0)
MCHC: 31.5 g/dL (ref 30.0–36.0)
MCV: 99 fL (ref 80.0–100.0)
Monocytes Absolute: 0.6 10*3/uL (ref 0.1–1.0)
Monocytes Relative: 19 %
Neutro Abs: 1.2 10*3/uL — ABNORMAL LOW (ref 1.7–7.7)
Neutrophils Relative %: 39 %
Platelets: 236 10*3/uL (ref 150–400)
RBC: 3.01 MIL/uL — AB (ref 3.87–5.11)
RDW: 15.1 % (ref 11.5–15.5)
WBC: 3.1 10*3/uL — AB (ref 4.0–10.5)
nRBC: 0 % (ref 0.0–0.2)

## 2018-08-21 LAB — MAGNESIUM: Magnesium: 1.8 mg/dL (ref 1.7–2.4)

## 2018-08-21 MED ORDER — BORTEZOMIB CHEMO SQ INJECTION 3.5 MG (2.5MG/ML)
1.3000 mg/m2 | Freq: Once | INTRAMUSCULAR | Status: AC
  Administered 2018-08-21: 3 mg via SUBCUTANEOUS
  Filled 2018-08-21: qty 1.2

## 2018-08-21 MED ORDER — PROCHLORPERAZINE MALEATE 10 MG PO TABS: 10.0000 mg | ORAL_TABLET | Freq: Once | ORAL | Status: DC

## 2018-08-21 NOTE — Assessment & Plan Note (Signed)
1.  IgG lambda multiple myeloma, stage III, del 17p: - Presentation with lower back pain since March, presented to the emergency room on 10/25/2017 with right groin pain, CT scan showed multiple lytic lesions, multiple lytic lesions on skeletal survey - SPEP shows 1.5 g/dL of monoclonal protein, beta-2 microglobulin of 4.8, free lambda light chains of 2869, kappa by lambda ratio of less than 0, elevated LDH. 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  FISH panel shows -14, -12, 13 q.-/-13 and 17p-.  Bone marrow biopsy shows 90% plasma cells. -4 cycles of RVD from 11/01/2017 through 01/14/2018.  Labs done on 01/20/2018 showed M spike to be negative. - She was evaluated by Dr. Laverta Baltimore at [facility].  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 - Because of her high risk disease, consolidation 2 cycles chemotherapy followed by maintenance was recommended. - 2 cycles of consolidation RVD from 06/11/2018 through 07/01/2018. -Maintenance Velcade every 2 weeks with Revlimid 10 mg 3 weeks on 1 week off started on 07/29/2018. - She was hospitalized with mild fever and respiratory symptoms including wheezing from 08/09/2018 through 08/11/2018.  A nasal swab apparently was positive for RSV. - We have held her Revlimid since she was hospitalized.  Last Velcade was on 07/29/2018. - I have reviewed her labs.  She went to [facility] on 08/18/2018.  Myeloma panel was negative. -She will restart her Velcade maintenance today.  I will hold her Revlimid at this time.  ANC today is 1200.  Urinalysis was reportedly positive yesterday and she is taking Macrobid twice daily.  I plan to repeat her blood work next week and restart her on Revlimid at that point.  2.  Bone strengthening agents: -She is currently receiving zoledronic acid every 3 months.  3.  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 -Neuropathy is well controlled with Lyrica 200 mg twice daily.  4.  ID prophylaxis: -She will continue acyclovir twice daily.  5.  Hypomagnesemia: -She is taking magnesium 400 mg 2 tablets twice daily.  We will check her magnesium today.  6.  Diabetes: - She is continuing metformin thousand milligrams twice daily. -She was recently started on Trulicity and Lantus 7 units at bedtime.

## 2018-08-21 NOTE — Progress Notes (Signed)
[address], [place] [address]   CLINIC:  Medical Oncology/Hematology  PCP:  Mikey Kirschner, [place] [address] [phone]   REASON FOR VISIT: Follow-up for multiple myeloma  CURRENT THERAPY:Maintenance Velcade and Revlimid.  BRIEF ONCOLOGIC HISTORY:    Multiple myeloma ([place])   10/29/2017 Initial Diagnosis    Multiple myeloma ([place])    11/01/2017 - 01/24/2018 Chemotherapy    The patient had bortezomib SQ (VELCADE) chemo injection 3 mg, 1.3 mg/m2 = 3 mg, Subcutaneous,  Once, 5 of 5 cycles Administration: 3 mg (11/01/2017), 3 mg (11/08/2017), 3 mg (11/05/2017), 3 mg (11/22/2017), 3 mg (11/12/2017), 3 mg (11/29/2017), 3 mg (11/26/2017), 3 mg (12/03/2017), 3 mg (12/13/2017), 3 mg (12/20/2017), 3 mg (12/17/2017), 3 mg (12/24/2017), 3 mg (01/03/2018), 3 mg (01/10/2018), 3 mg (01/07/2018), 3 mg (01/14/2018), 3 mg (01/24/2018)  for chemotherapy treatment.     06/11/2018 -  Chemotherapy    The patient had bortezomib SQ (VELCADE) chemo injection 3 mg, 1.3 mg/m2 = 3 mg, Subcutaneous,  Once, 8 of 14 cycles Administration: 3 mg (06/11/2018), 3 mg (07/29/2018), 3 mg (08/21/2018), 3 mg (06/18/2018), 3 mg (06/25/2018), 3 mg (07/01/2018), 3 mg (07/08/2018), 3 mg (07/15/2018)  for chemotherapy treatment.       CANCER STAGING: Cancer Staging Multiple myeloma ([place]) Staging form: Plasma Cell Myeloma and Plasma Cell Disorders, AJCC 8th Edition - Clinical: No stage assigned - Unsigned    INTERVAL HISTORY:  Ms. Schaus 53 y.o. female returns for routine follow-up for multiple myeloma. She is here today with her daughter. She was in the hospital with RSV infection. She is better now she has a slight cough. She reports she was also seen at Dr. Wolfgang [place] office yesterday and is being treated for a UTI now. She is feeling much better and is ready for her injection today. Denies any nausea, vomiting, or diarrhea. Denies any new pains. Had not noticed any recent  bleeding such as epistaxis, hematuria or hematochezia. Denies recent chest pain on exertion, shortness of breath on minimal exertion, pre-syncopal episodes, or palpitations. Denies any numbness or tingling in hands or feet. Denies any recent fevers, infections, or recent hospitalizations. Patient reports appetite at 100% and energy level at 75%.   REVIEW OF SYSTEMS:  Review of Systems  All other systems reviewed and are negative.    PAST MEDICAL/SURGICAL HISTORY:  Past Medical History:  Diagnosis Date  . Acid reflux   . Allergic rhinitis   . Cancer ([place])    multiple myeloma  . Diabetes mellitus    type 2  . Gout   . Gout   . HBP (high blood pressure)   . History of kidney stones   . Migraines    Past Surgical History:  Procedure Laterality Date  . CESAREAN SECTION    . EXTRACORPOREAL SHOCK WAVE LITHOTRIPSY Left 10/10/2017   Procedure: LEFT EXTRACORPOREAL SHOCK WAVE LITHOTRIPSY (ESWL);  Surgeon: Bjorn Loser, MD;  Location: [facility];  Service: Urology;  Laterality: Left;  . EYE SURGERY    . HEMORRHOID SURGERY N/A 11/19/2012   Procedure: HEMORRHOIDECTOMY;  Surgeon: Jamesetta So, MD;  Location: [facility];  Service: General;  Laterality: N/A;  . kidney stones  1998  . LAPAROSCOPIC UNILATERAL SALPINGO OOPHERECTOMY  05/14/2012   Procedure: LAPAROSCOPIC UNILATERAL SALPINGO OOPHORECTOMY;  Surgeon: [name], MD;  Location: [facility];  Service: Gynecology;  Laterality: Right;  laparoscopic right salpingo-oophorectomy  . PARTIAL HYSTERECTOMY    .  TONSILECTOMY, ADENOIDECTOMY, BILATERAL MYRINGOTOMY AND TUBES    . VESICOVAGINAL FISTULA CLOSURE W/ TAH       SOCIAL HISTORY:  Social History   Socioeconomic History  . Marital status: Married    Spouse name: Not on file  . Number of children: Not on file  . Years of education: 73  . Highest education level: Not on file  Occupational History    Employer: [employer]  . Financial resource strain: Not on file  . Food  insecurity:    Worry: Never true    Inability: Never true  . Transportation needs:    Medical: Not on file    Non-medical: Not on file  Tobacco Use  . Smoking status: Former Smoker    Last attempt to quit: 02/27/1999    Years since quitting: 19.4  . Smokeless tobacco: Never Used  . Tobacco comment: socially   Substance and Sexual Activity  . Alcohol use: No    Alcohol/week: 0.0 standard drinks  . Drug use: No  . Sexual activity: Yes  Lifestyle  . Physical activity:    Days per week: Not on file    Minutes per session: Not on file  . Stress: Not on file  Relationships  . Social connections:    Talks on phone: Not on file    Gets together: Not on file    Attends religious service: Not on file    Active member of club or organization: Not on file    Attends meetings of clubs or organizations: Not on file    Relationship status: Not on file  . Intimate partner violence:    Fear of current or ex partner: Not on file    Emotionally abused: Not on file    Physically abused: Not on file    Forced sexual activity: Not on file  Other Topics Concern  . Not on file  Social History Narrative  . Not on file    FAMILY HISTORY:  Family History  Problem Relation Age of Onset  . Arthritis Other   . Cancer Other   . Diabetes Other   . Hypertension Mother   . Dementia Mother   . Diabetes Father   . ALS Father   . Diabetes Brother   . Hypertension Brother   . Cancer Paternal Aunt   . COPD Maternal Grandmother   . Cancer Maternal Grandfather   . Anesthesia problems Paternal Grandfather     CURRENT MEDICATIONS:  Outpatient Encounter Medications as of 08/21/2018  Medication Sig Note  . acyclovir (ZOVIRAX) 800 MG tablet Take 800 mg by mouth 2 (two) times daily.    Marland Kitchen albuterol (PROVENTIL HFA;VENTOLIN HFA) 108 (90 Base) MCG/ACT inhaler Inhale 2 puffs into the lungs every 4 (four) hours as needed for wheezing or shortness of breath.   . allopurinol (ZYLOPRIM) 300 MG tablet TAKE 1  TABLET BY MOUTH  DAILY FOR GOUT (Patient taking differently: Take 300 mg by mouth every morning. )   . aspirin EC 81 MG tablet Take 81 mg by mouth daily.   . Bortezomib (VELCADE IJ) Inject as directed every 14 (fourteen) days.  08/09/2018: Due for next injection on 08/13/2018  . bumetanide (BUMEX) 1 MG tablet Take 1 tablet (1 mg total) by mouth every morning.   . carvedilol (COREG) 6.25 MG tablet Take 6.25 mg by mouth 2 (two) times daily with a meal.    . diazepam (VALIUM) 5 MG tablet TAKE 1 TABLET BY MOUTH AT BEDTIME  AS NEEDED FOR ANXIETY OR INSOMNIA (Patient taking differently: Take 5 mg by mouth at bedtime. )   . docusate sodium (COLACE) 100 MG capsule Take 100 mg by mouth daily.    . fluticasone (FLONASE) 50 MCG/ACT nasal spray Place 2 sprays into the nose 2 (two) times daily.   Marland Kitchen glucose blood (PRECISION QID TEST) test strip Use 1 each (1 strip total) 4 (four) times daily Use as instructed. OneTouch Verio   . insulin glargine (LANTUS) 100 UNIT/ML injection Inject 7 Units into the skin at bedtime.   Marland Kitchen losartan (COZAAR) 100 MG tablet Take 1 tablet (100 mg total) by mouth every morning.   . magnesium oxide (MAG-OX) 400 (241.3 Mg) MG tablet Take 1 tablet (400 mg total) by mouth 2 (two) times daily.   . metFORMIN (GLUCOPHAGE) 500 MG tablet TAKE 1 TABLET BY MOUTH ONCE DAILY WITH BREAKFAST (Patient taking differently: Take 1,000 mg by mouth 2 (two) times daily with a meal. )   . nitrofurantoin, macrocrystal-monohydrate, (MACROBID) 100 MG capsule Take 1 capsule (100 mg total) by mouth 2 (two) times daily.   Marland Kitchen omeprazole (PRILOSEC) 20 MG capsule TAKE 1 CAPSULE BY MOUTH  DAILY (Patient taking differently: Take 20 mg by mouth every morning. )   . potassium chloride SA (K-DUR,KLOR-CON) 20 MEQ tablet Take 20 mEq by mouth daily.    . pregabalin (LYRICA) 200 MG capsule Take 1 capsule (200 mg total) by mouth 2 (two) times daily.   . prochlorperazine (COMPAZINE) 10 MG tablet Take 1 tablet (10 mg total) by mouth  every 6 (six) hours as needed for nausea or vomiting. 08/09/2018: Takes prior to Velcade injection  . rosuvastatin (CRESTOR) 5 MG tablet Take 5 mg by mouth daily.    . TRULICITY 6.83 MH/9.6QI SOPN Inject 0.75 mg into the skin every Monday.    . verapamil (CALAN-SR) 240 MG CR tablet TAKE 1 TABLET BY MOUTH AT  BEDTIME (Patient taking differently: Take 240 mg by mouth at bedtime. )   . dextromethorphan (DELSYM) 30 MG/5ML liquid Take 10 mLs (60 mg total) by mouth 2 (two) times daily as needed for cough. (Patient not taking: Reported on 08/21/2018)   . fluconazole (DIFLUCAN) 150 MG tablet Take 1 tablet (150 mg total) by mouth once as needed for up to 1 dose (yeast infection). Repeat if needed (Patient not taking: Reported on 08/21/2018)   . HUMALOG KWIKPEN 100 UNIT/ML KwikPen Inject 2-6 Units into the skin 3 (three) times daily before meals. Per sliding scale for blood sugar levels over 200   . lenalidomide (REVLIMID) 10 MG capsule Take for 3 weeks then take one-week off (Patient not taking: Reported on 08/21/2018)   . ondansetron (ZOFRAN) 8 MG tablet Take 1 tablet (8 mg total) by mouth every 8 (eight) hours as needed. (Patient not taking: Reported on 08/21/2018)   . [DISCONTINUED] Spacer/Aero-Holding Chambers (AEROCHAMBER PLUS WITH MASK) inhaler Use as directed with albuterol inhaler    No facility-administered encounter medications on file as of 08/21/2018.     ALLERGIES:  No Known Allergies   PHYSICAL EXAM:  ECOG Performance status: 1  Vitals:   08/21/18 0900  BP: (!) 142/87  Pulse: 72  Resp: 16  Temp: 98.1 F (36.7 C)  SpO2: 99%   Filed Weights   08/21/18 0900  Weight: 266 lb 8 oz (120.9 kg)    Physical Exam Constitutional:      Appearance: Normal appearance. She is normal weight.  Cardiovascular:     Rate  and Rhythm: Normal rate and regular rhythm.     Heart sounds: Normal heart sounds.  Pulmonary:     Effort: Pulmonary effort is normal.     Breath sounds: Normal breath sounds.    Musculoskeletal: Normal range of motion.  Skin:    General: Skin is warm and dry.  Neurological:     Mental Status: She is alert and oriented to person, place, and time. Mental status is at baseline.  Psychiatric:        Mood and Affect: Mood normal.        Behavior: Behavior normal.        Thought Content: Thought content normal.        Judgment: Judgment normal.      LABORATORY DATA:  I have reviewed the labs as listed.  CBC    Component Value Date/Time   WBC 3.1 (L) 08/21/2018 0902   RBC 3.01 (L) 08/21/2018 0902   HGB 9.4 (L) 08/21/2018 0902   HGB 14.2 08/02/2015 1113   HCT 29.8 (L) 08/21/2018 0902   HCT 41.1 08/02/2015 1113   PLT 236 08/21/2018 0902   PLT 371 08/02/2015 1113   MCV 99.0 08/21/2018 0902   MCV 84 08/02/2015 1113   MCH 31.2 08/21/2018 0902   MCHC 31.5 08/21/2018 0902   RDW 15.1 08/21/2018 0902   RDW 15.1 08/02/2015 1113   LYMPHSABS 1.2 08/21/2018 0902   LYMPHSABS 2.9 08/02/2015 1113   MONOABS 0.6 08/21/2018 0902   EOSABS 0.1 08/21/2018 0902   EOSABS 0.2 08/02/2015 1113   BASOSABS 0.0 08/21/2018 0902   BASOSABS 0.1 08/02/2015 1113   CMP Latest Ref Rng & Units 08/21/2018 08/11/2018 08/10/2018  Glucose 70 - 99 mg/dL 174(H) 161(H) 158(H)  BUN 6 - 20 mg/dL 25(H) 15 15  Creatinine 0.44 - 1.00 mg/dL 1.07(H) 0.91 1.00  Sodium 135 - 145 mmol/L 139 138 134(L)  Potassium 3.5 - 5.1 mmol/L 4.1 3.9 3.2(L)  Chloride 98 - 111 mmol/L 106 108 100  CO2 22 - 32 mmol/L _0 Calcium 8.9 - 10.3 mg/dL 9.2 8.2(L) 8.1(L)  Total Protein 6.5 - 8.1 g/dL 6.8 6.5 5.6(L)  Total Bilirubin 0.3 - 1.2 mg/dL 0.6 0.9 0.8  Alkaline Phos 38 - 126 U/L 60 60 54  AST 15 - 41 U/L _1 ALT 0 - 44 U/L _2 DIAGNOSTIC IMAGING:  I have independently reviewed the scans and discussed with the patient.   I have reviewed Francene Finders, NP's note and agree with the documentation.  I personally performed a face-to-face visit, made revisions and my assessment and plan is as  follows.    ASSESSMENT & PLAN:   Multiple myeloma ([place]) 1.  IgG lambda multiple myeloma, stage III, del 17p: - Presentation with lower back pain since March, presented to the emergency room on 10/25/2017 with right groin pain, CT scan showed multiple lytic lesions, multiple lytic lesions on skeletal survey - SPEP shows 1.5 g/dL of monoclonal protein, beta-2 microglobulin of 4.8, free lambda light chains of 2869, kappa by lambda ratio of less than 0, elevated LDH. 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  FISH panel shows -14, -12, 13 q.-/-13 and 17p-.  Bone marrow biopsy shows 90% plasma cells. -4 cycles of RVD from 11/01/2017 through 01/14/2018.  Labs done on 01/20/2018 showed M spike to be negative. - She was evaluated by Dr. Laverta Baltimore at [facility].  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 - Because of her high risk disease, consolidation 2 cycles chemotherapy followed by maintenance was recommended. - 2 cycles of consolidation RVD from 06/11/2018 through 07/01/2018. -Maintenance Velcade every 2 weeks with Revlimid 10 mg 3 weeks on 1 week off started on 07/29/2018. - She was hospitalized with mild fever and respiratory symptoms including wheezing from 08/09/2018 through 08/11/2018.  A nasal swab apparently was positive for RSV. - We have held her Revlimid since she was hospitalized.  Last Velcade was on 07/29/2018. - I have reviewed her labs.  She went to [facility] on 08/18/2018.  Myeloma panel was negative. -She will restart her Velcade maintenance today.  I will hold her Revlimid at this time.  ANC today is 1200.  Urinalysis was reportedly positive yesterday and she is taking Macrobid twice daily.  I plan to repeat her blood work next week and restart her on Revlimid at that point.  2.  Bone strengthening agents: -She is currently receiving zoledronic acid every 3 months.  3.  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 -Neuropathy is well controlled with Lyrica 200 mg twice daily.  4.  ID prophylaxis: -She will continue acyclovir twice daily.  5.  Hypomagnesemia: -She is taking magnesium 400 mg 2 tablets twice daily.  We will check her magnesium today.  6.  Diabetes: - She is continuing metformin thousand milligrams twice daily. -She was recently started on Trulicity and Lantus 7 units at bedtime.      Orders placed this encounter:  Orders Placed This Encounter  Procedures  . Magnesium  . Magnesium  . CBC with Differential/Platelet  . Comprehensive metabolic panel  . IgG, IgA, IgM      Derek Jack, MD [place] [phone]

## 2018-08-21 NOTE — Progress Notes (Signed)
Patient seen by Dr. Delton Coombes with lab review and ok to treat today.   Patient tolerated injection with no complaints voiced.  Site clean and dry with band aid applied.  VSS with discharge and left ambulatory with no s/s of distress noted.

## 2018-08-21 NOTE — Patient Instructions (Signed)
[facility] Discharge Instructions for Patients Receiving Chemotherapy  Today you received the following chemotherapy agents  If you develop nausea and vomiting that is not controlled by your nausea medication, call the clinic.   BELOW ARE SYMPTOMS THAT SHOULD BE REPORTED IMMEDIATELY:  *FEVER GREATER THAN 100.5 F  *CHILLS WITH OR WITHOUT FEVER  NAUSEA AND VOMITING THAT IS NOT CONTROLLED WITH YOUR NAUSEA MEDICATION  *UNUSUAL SHORTNESS OF BREATH  *UNUSUAL BRUISING OR BLEEDING  TENDERNESS IN MOUTH AND THROAT WITH OR WITHOUT PRESENCE OF ULCERS  *URINARY PROBLEMS  *BOWEL PROBLEMS  UNUSUAL RASH Items with * indicate a potential emergency and should be followed up as soon as possible.  Feel free to call the clinic should you have any questions or concerns. The clinic phone number is [phone].  Please show the CHEMO ALERT CARD at check-in to the Emergency Department and triage nurse.   

## 2018-08-21 NOTE — Patient Instructions (Signed)
[facility] at [facility] Discharge Instructions     Thank you for choosing [facility] at [facility] to provide your oncology and hematology care.  To afford each patient quality time with our provider, please arrive at least 15 minutes before your scheduled appointment time.   If you have a lab appointment with the Cancer Center please come in thru the  Main Entrance and check in at the main information desk  You need to re-schedule your appointment should you arrive 10 or more minutes late.  We strive to give you quality time with our providers, and arriving late affects you and other patients whose appointments are after yours.  Also, if you no show three or more times for appointments you may be dismissed from the clinic at the providers discretion.     Again, thank you for choosing [facility].  Our hope is that these requests will decrease the amount of time that you wait before being seen by our physicians.       _____________________________________________________________  Should you have questions after your visit to  Cancer Center, please contact our office at [phone] between the hours of 8:00 a.m. and 4:30 p.m.  Voicemails left after 4:00 p.m. will not be returned until the following business day.  For prescription refill requests, have your pharmacy contact our office and allow 72 hours.    Cancer Center Support Programs:   > Cancer Support Group  2nd Tuesday of the month 1pm-2pm, Journey Room    

## 2018-08-23 LAB — URINE CULTURE

## 2018-08-27 ENCOUNTER — Ambulatory Visit (HOSPITAL_COMMUNITY): Payer: Self-pay

## 2018-08-27 ENCOUNTER — Encounter (HOSPITAL_COMMUNITY): Payer: Self-pay

## 2018-08-27 ENCOUNTER — Inpatient Hospital Stay (HOSPITAL_COMMUNITY): Payer: BLUE CROSS/BLUE SHIELD

## 2018-08-27 ENCOUNTER — Ambulatory Visit (HOSPITAL_COMMUNITY): Payer: Self-pay | Admitting: Hematology

## 2018-08-27 ENCOUNTER — Other Ambulatory Visit (HOSPITAL_COMMUNITY): Payer: Self-pay

## 2018-08-27 ENCOUNTER — Encounter (INDEPENDENT_AMBULATORY_CARE_PROVIDER_SITE_OTHER): Payer: Self-pay | Admitting: *Deleted

## 2018-08-27 ENCOUNTER — Encounter: Payer: Self-pay | Admitting: Family Medicine

## 2018-08-27 VITALS — BP 136/85 | HR 75 | Temp 97.8°F | Resp 18

## 2018-08-27 DIAGNOSIS — Z5112 Encounter for antineoplastic immunotherapy: Secondary | ICD-10-CM | POA: Diagnosis not present

## 2018-08-27 DIAGNOSIS — C9000 Multiple myeloma not having achieved remission: Secondary | ICD-10-CM

## 2018-08-27 LAB — CBC WITH DIFFERENTIAL/PLATELET
Abs Immature Granulocytes: 0.01 10*3/uL (ref 0.00–0.07)
BASOS PCT: 1 %
Basophils Absolute: 0 10*3/uL (ref 0.0–0.1)
Eosinophils Absolute: 0 10*3/uL (ref 0.0–0.5)
Eosinophils Relative: 1 %
HCT: 31.9 % — ABNORMAL LOW (ref 36.0–46.0)
Hemoglobin: 10.2 g/dL — ABNORMAL LOW (ref 12.0–15.0)
Immature Granulocytes: 0 %
Lymphocytes Relative: 35 %
Lymphs Abs: 1.4 10*3/uL (ref 0.7–4.0)
MCH: 30.5 pg (ref 26.0–34.0)
MCHC: 32 g/dL (ref 30.0–36.0)
MCV: 95.5 fL (ref 80.0–100.0)
Monocytes Absolute: 0.4 10*3/uL (ref 0.1–1.0)
Monocytes Relative: 11 %
Neutro Abs: 2 10*3/uL (ref 1.7–7.7)
Neutrophils Relative %: 52 %
Platelets: 209 10*3/uL (ref 150–400)
RBC: 3.34 MIL/uL — ABNORMAL LOW (ref 3.87–5.11)
RDW: 14.8 % (ref 11.5–15.5)
WBC: 3.9 10*3/uL — ABNORMAL LOW (ref 4.0–10.5)
nRBC: 0 % (ref 0.0–0.2)

## 2018-08-27 LAB — COMPREHENSIVE METABOLIC PANEL
ALT: 20 U/L (ref 0–44)
AST: 33 U/L (ref 15–41)
Albumin: 4.3 g/dL (ref 3.5–5.0)
Alkaline Phosphatase: 60 U/L (ref 38–126)
Anion gap: 11 (ref 5–15)
BUN: 21 mg/dL — ABNORMAL HIGH (ref 6–20)
CO2: 25 mmol/L (ref 22–32)
CREATININE: 1.04 mg/dL — AB (ref 0.44–1.00)
Calcium: 9.5 mg/dL (ref 8.9–10.3)
Chloride: 104 mmol/L (ref 98–111)
GFR calc Af Amer: >60 mL/min (ref >60)
GFR calc non Af Amer: >60 mL/min (ref >60)
Glucose, Bld: 128 mg/dL — ABNORMAL HIGH (ref 70–99)
Potassium: 3.8 mmol/L (ref 3.5–5.1)
Sodium: 140 mmol/L (ref 135–145)
Total Bilirubin: 0.9 mg/dL (ref 0.3–1.2)
Total Protein: 7.2 g/dL (ref 6.5–8.1)

## 2018-08-27 LAB — MAGNESIUM: MAGNESIUM: 1.6 mg/dL — AB (ref 1.7–2.4)

## 2018-08-27 MED ORDER — SODIUM CHLORIDE 0.9 % IV SOLN
INTRAVENOUS | Status: DC
  Administered 2018-08-27: 11:00:00 via INTRAVENOUS

## 2018-08-27 MED ORDER — ZOLEDRONIC ACID 4 MG/100ML IV SOLN
4.0000 mg | Freq: Once | INTRAVENOUS | Status: AC
  Administered 2018-08-27: 4 mg via INTRAVENOUS
  Filled 2018-08-27: qty 100

## 2018-08-27 NOTE — Patient Instructions (Signed)
[facility] at [facility]  Discharge Instructions:   _______________________________________________________________  Thank you for choosing [facility] at [facility] to provide your oncology and hematology care.  To afford each patient quality time with our providers, please arrive at least 15 minutes before your scheduled appointment.  You need to re-schedule your appointment if you arrive 10 or more minutes late.  We strive to give you quality time with our providers, and arriving late affects you and other patients whose appointments are after yours.  Also, if you no show three or more times for appointments you may be dismissed from the clinic.  Again, thank you for choosing [facility] at  Hospital. Our hope is that these requests will allow you access to exceptional care and in a timely manner. _______________________________________________________________  If you have questions after your visit, please contact our office at [phone] between the hours of 8:30 a.m. and 5:00 p.m. Voicemails left after 4:30 p.m. will not be returned until the following business day. _______________________________________________________________  For prescription refill requests, have your pharmacy contact our office. _______________________________________________________________  Recommendations made by the consultant and any test results will be sent to your referring physician. _______________________________________________________________ 

## 2018-08-28 LAB — IGG, IGA, IGM
IGG (IMMUNOGLOBIN G), SERUM: 819 mg/dL (ref 700–1600)
IGM (IMMUNOGLOBULIN M), SRM: 22 mg/dL — AB (ref 26–217)
IgA: 91 mg/dL (ref 87–352)

## 2018-09-01 DIAGNOSIS — Z029 Encounter for administrative examinations, unspecified: Secondary | ICD-10-CM

## 2018-09-03 ENCOUNTER — Other Ambulatory Visit (HOSPITAL_COMMUNITY): Payer: Self-pay

## 2018-09-03 DIAGNOSIS — Z5111 Encounter for antineoplastic chemotherapy: Secondary | ICD-10-CM

## 2018-09-03 DIAGNOSIS — C9001 Multiple myeloma in remission: Secondary | ICD-10-CM

## 2018-09-03 DIAGNOSIS — C9000 Multiple myeloma not having achieved remission: Secondary | ICD-10-CM

## 2018-09-04 ENCOUNTER — Inpatient Hospital Stay (HOSPITAL_COMMUNITY): Payer: BLUE CROSS/BLUE SHIELD

## 2018-09-04 ENCOUNTER — Encounter (HOSPITAL_COMMUNITY): Payer: Self-pay

## 2018-09-04 VITALS — BP 113/75 | HR 82 | Temp 98.3°F | Resp 18 | Wt 266.2 lb

## 2018-09-04 DIAGNOSIS — C9000 Multiple myeloma not having achieved remission: Secondary | ICD-10-CM

## 2018-09-04 DIAGNOSIS — C9001 Multiple myeloma in remission: Secondary | ICD-10-CM

## 2018-09-04 DIAGNOSIS — Z5111 Encounter for antineoplastic chemotherapy: Secondary | ICD-10-CM

## 2018-09-04 DIAGNOSIS — Z5112 Encounter for antineoplastic immunotherapy: Secondary | ICD-10-CM | POA: Diagnosis not present

## 2018-09-04 LAB — COMPREHENSIVE METABOLIC PANEL
ALT: 24 U/L (ref 0–44)
AST: 32 U/L (ref 15–41)
Albumin: 4.3 g/dL (ref 3.5–5.0)
Alkaline Phosphatase: 71 U/L (ref 38–126)
Anion gap: 13 (ref 5–15)
BUN: 19 mg/dL (ref 6–20)
CHLORIDE: 104 mmol/L (ref 98–111)
CO2: 24 mmol/L (ref 22–32)
Calcium: 9.9 mg/dL (ref 8.9–10.3)
Creatinine, Ser: 1.32 mg/dL — ABNORMAL HIGH (ref 0.44–1.00)
GFR calc non Af Amer: 46 mL/min — ABNORMAL LOW (ref >60)
GFR, EST AFRICAN AMERICAN: 53 mL/min — AB (ref >60)
Glucose, Bld: 114 mg/dL — ABNORMAL HIGH (ref 70–99)
Potassium: 3.6 mmol/L (ref 3.5–5.1)
SODIUM: 141 mmol/L (ref 135–145)
Total Bilirubin: 0.5 mg/dL (ref 0.3–1.2)
Total Protein: 7.1 g/dL (ref 6.5–8.1)

## 2018-09-04 LAB — CBC WITH DIFFERENTIAL/PLATELET
Abs Immature Granulocytes: 0.02 10*3/uL (ref 0.00–0.07)
Basophils Absolute: 0 10*3/uL (ref 0.0–0.1)
Basophils Relative: 1 %
Eosinophils Absolute: 0.1 10*3/uL (ref 0.0–0.5)
Eosinophils Relative: 3 %
HCT: 34.6 % — ABNORMAL LOW (ref 36.0–46.0)
Hemoglobin: 10.9 g/dL — ABNORMAL LOW (ref 12.0–15.0)
Immature Granulocytes: 1 %
Lymphocytes Relative: 28 %
Lymphs Abs: 1.1 10*3/uL (ref 0.7–4.0)
MCH: 30.4 pg (ref 26.0–34.0)
MCHC: 31.5 g/dL (ref 30.0–36.0)
MCV: 96.4 fL (ref 80.0–100.0)
MONO ABS: 0.4 10*3/uL (ref 0.1–1.0)
MONOS PCT: 9 %
Neutro Abs: 2.3 10*3/uL (ref 1.7–7.7)
Neutrophils Relative %: 58 %
PLATELETS: 219 10*3/uL (ref 150–400)
RBC: 3.59 MIL/uL — AB (ref 3.87–5.11)
RDW: 14.7 % (ref 11.5–15.5)
WBC: 4 10*3/uL (ref 4.0–10.5)
nRBC: 0 % (ref 0.0–0.2)

## 2018-09-04 MED ORDER — BORTEZOMIB CHEMO SQ INJECTION 3.5 MG (2.5MG/ML)
1.3000 mg/m2 | Freq: Once | INTRAMUSCULAR | Status: AC
  Administered 2018-09-04: 3 mg via SUBCUTANEOUS
  Filled 2018-09-04: qty 1.2

## 2018-09-04 MED ORDER — PROCHLORPERAZINE MALEATE 10 MG PO TABS: 10.0000 mg | ORAL_TABLET | Freq: Once | ORAL | Status: DC

## 2018-09-04 NOTE — Progress Notes (Signed)
Patient tolerated injection with no complaints voiced.  Site clean and dry with no bruising or swelling noted at site.  Band aid applied.  VSS with discharge and left ambulatory with no s/s of distress noted.    

## 2018-09-04 NOTE — Patient Instructions (Signed)
[facility] at [facility]  Discharge Instructions:   _______________________________________________________________  Thank you for choosing [facility] at [place] to provide your oncology and hematology care.  To afford each patient quality time with our providers, please arrive at least 15 minutes before your scheduled appointment.  You need to re-schedule your appointment if you arrive 10 or more minutes late.  We strive to give you quality time with our providers, and arriving late affects you and other patients whose appointments are after yours.  Also, if you no show three or more times for appointments you may be dismissed from the clinic.  Again, thank you for choosing [facility] at [facility]. Our hope is that these requests will allow you access to exceptional care and in a timely manner. _______________________________________________________________  If you have questions after your visit, please contact our office at [phone] between the hours of 8:30 a.m. and 5:00 p.m. Voicemails left after 4:30 p.m. will not be returned until the following business day. _______________________________________________________________  For prescription refill requests, have your pharmacy contact our office. _______________________________________________________________  Recommendations made by the consultant and any test results will be sent to your referring physician. _______________________________________________________________ 

## 2018-09-05 ENCOUNTER — Telehealth: Payer: Self-pay | Admitting: Family Medicine

## 2018-09-05 MED ORDER — CIPROFLOXACIN HCL 500 MG PO TABS: 500.0000 mg | ORAL_TABLET | Freq: Two times a day (BID) | ORAL | 0 refills | Status: DC

## 2018-09-05 NOTE — Telephone Encounter (Signed)
Patient was seen 08/20/18 for dysuria

## 2018-09-05 NOTE — Telephone Encounter (Signed)
Patient was taking nitrofurantoin, macrocrystal-monohydrate, (MACROBID) 100 MG capsule for UTI and patient states there is still burning sensation and odor and patient would like another medication called in, advise.    Pharmacy:  [place], [place]

## 2018-09-05 NOTE — Telephone Encounter (Signed)
Based upon recent urine culture and sensitivities recommend Cipro 500 mg twice daily 7 days  Follow-up if progressive troubles or problems

## 2018-09-05 NOTE — Telephone Encounter (Signed)
Prescription sent electronically to pharmacy. Left message to return call to notifiy patient.

## 2018-09-05 NOTE — Telephone Encounter (Signed)
Patient notified

## 2018-09-17 ENCOUNTER — Other Ambulatory Visit: Payer: Self-pay | Admitting: Family Medicine

## 2018-09-18 ENCOUNTER — Encounter (HOSPITAL_COMMUNITY): Payer: Self-pay | Admitting: Hematology

## 2018-09-18 ENCOUNTER — Inpatient Hospital Stay (HOSPITAL_COMMUNITY): Payer: BLUE CROSS/BLUE SHIELD

## 2018-09-18 ENCOUNTER — Inpatient Hospital Stay (HOSPITAL_COMMUNITY): Payer: BLUE CROSS/BLUE SHIELD | Attending: Hematology | Admitting: Hematology

## 2018-09-18 ENCOUNTER — Other Ambulatory Visit: Payer: Self-pay | Admitting: Family Medicine

## 2018-09-18 ENCOUNTER — Other Ambulatory Visit: Payer: Self-pay

## 2018-09-18 VITALS — BP 126/84 | HR 65 | Temp 98.1°F | Resp 18 | Wt 267.5 lb

## 2018-09-18 DIAGNOSIS — Z809 Family history of malignant neoplasm, unspecified: Secondary | ICD-10-CM

## 2018-09-18 DIAGNOSIS — E119 Type 2 diabetes mellitus without complications: Secondary | ICD-10-CM

## 2018-09-18 DIAGNOSIS — C9000 Multiple myeloma not having achieved remission: Secondary | ICD-10-CM | POA: Diagnosis not present

## 2018-09-18 DIAGNOSIS — Z794 Long term (current) use of insulin: Secondary | ICD-10-CM

## 2018-09-18 DIAGNOSIS — Z87891 Personal history of nicotine dependence: Secondary | ICD-10-CM

## 2018-09-18 DIAGNOSIS — Z5111 Encounter for antineoplastic chemotherapy: Secondary | ICD-10-CM

## 2018-09-18 DIAGNOSIS — G629 Polyneuropathy, unspecified: Secondary | ICD-10-CM

## 2018-09-18 DIAGNOSIS — C9001 Multiple myeloma in remission: Secondary | ICD-10-CM

## 2018-09-18 DIAGNOSIS — Z5112 Encounter for antineoplastic immunotherapy: Secondary | ICD-10-CM | POA: Diagnosis not present

## 2018-09-18 LAB — CBC WITH DIFFERENTIAL/PLATELET
Abs Immature Granulocytes: 0.04 10*3/uL (ref 0.00–0.07)
Basophils Absolute: 0 10*3/uL (ref 0.0–0.1)
Basophils Relative: 1 %
Eosinophils Absolute: 0.3 10*3/uL (ref 0.0–0.5)
Eosinophils Relative: 7 %
HCT: 31.4 % — ABNORMAL LOW (ref 36.0–46.0)
Hemoglobin: 10.4 g/dL — ABNORMAL LOW (ref 12.0–15.0)
Immature Granulocytes: 1 %
Lymphocytes Relative: 27 %
Lymphs Abs: 1 10*3/uL (ref 0.7–4.0)
MCH: 31 pg (ref 26.0–34.0)
MCHC: 33.1 g/dL (ref 30.0–36.0)
MCV: 93.5 fL (ref 80.0–100.0)
MONO ABS: 0.5 10*3/uL (ref 0.1–1.0)
MONOS PCT: 13 %
Neutro Abs: 1.9 10*3/uL (ref 1.7–7.7)
Neutrophils Relative %: 51 %
Platelets: 203 10*3/uL (ref 150–400)
RBC: 3.36 MIL/uL — ABNORMAL LOW (ref 3.87–5.11)
RDW: 15.1 % (ref 11.5–15.5)
WBC: 3.7 10*3/uL — ABNORMAL LOW (ref 4.0–10.5)
nRBC: 0 % (ref 0.0–0.2)

## 2018-09-18 LAB — MAGNESIUM: Magnesium: 1.7 mg/dL (ref 1.7–2.4)

## 2018-09-18 LAB — COMPREHENSIVE METABOLIC PANEL
ALK PHOS: 66 U/L (ref 38–126)
ALT: 18 U/L (ref 0–44)
AST: 25 U/L (ref 15–41)
Albumin: 3.9 g/dL (ref 3.5–5.0)
Anion gap: 12 (ref 5–15)
BUN: 16 mg/dL (ref 6–20)
CALCIUM: 9.5 mg/dL (ref 8.9–10.3)
CO2: 26 mmol/L (ref 22–32)
Chloride: 102 mmol/L (ref 98–111)
Creatinine, Ser: 1.17 mg/dL — ABNORMAL HIGH (ref 0.44–1.00)
GFR calc Af Amer: >60 mL/min (ref >60)
GFR calc non Af Amer: 53 mL/min — ABNORMAL LOW (ref >60)
Glucose, Bld: 136 mg/dL — ABNORMAL HIGH (ref 70–99)
Potassium: 2.9 mmol/L — ABNORMAL LOW (ref 3.5–5.1)
Sodium: 140 mmol/L (ref 135–145)
Total Bilirubin: 0.7 mg/dL (ref 0.3–1.2)
Total Protein: 6.8 g/dL (ref 6.5–8.1)

## 2018-09-18 MED ORDER — BORTEZOMIB CHEMO SQ INJECTION 3.5 MG (2.5MG/ML)
1.3000 mg/m2 | Freq: Once | INTRAMUSCULAR | Status: AC
  Administered 2018-09-18: 3 mg via SUBCUTANEOUS
  Filled 2018-09-18: qty 1.2

## 2018-09-18 MED ORDER — PROCHLORPERAZINE MALEATE 10 MG PO TABS: 10.0000 mg | ORAL_TABLET | Freq: Once | ORAL | Status: DC

## 2018-09-18 MED ORDER — LENALIDOMIDE 10 MG PO CAPS: ORAL_CAPSULE | ORAL | 0 refills | Status: DC

## 2018-09-18 NOTE — Patient Instructions (Signed)
[place] at [facility] Discharge Instructions  You were seen today by Dr. Delton Coombes. He went over your recent lab results and would like you to take a potassium pill when you get home today. We will see you back in 4 weeks for labs and follow up.   Thank you for choosing [place] at [facility] to provide your oncology and hematology care.  To afford each patient quality time with our provider, please arrive at least 15 minutes before your scheduled appointment time.   If you have a lab appointment with the [facility] please come in thru the  Main Entrance and check in at the main information desk  You need to re-schedule your appointment should you arrive 10 or more minutes late.  We strive to give you quality time with our providers, and arriving late affects you and other patients whose appointments are after yours.  Also, if you no show three or more times for appointments you may be dismissed from the clinic at the providers discretion.     Again, thank you for choosing [facility].  Our hope is that these requests will decrease the amount of time that you wait before being seen by our physicians.       _____________________________________________________________  Should you have questions after your visit to [facility], please contact our office at [phone] between the hours of 8:00 a.m. and 4:30 p.m.  Voicemails left after 4:00 p.m. will not be returned until the following business day.  For prescription refill requests, have your pharmacy contact our office and allow 72 hours.    Cancer Center Support Programs:   > Cancer Support Group  2nd Tuesday of the month 1pm-2pm, Journey Room

## 2018-09-18 NOTE — Patient Instructions (Signed)
[facility] Discharge Instructions for Patients Receiving Chemotherapy  Today you received the following chemotherapy agents  If you develop nausea and vomiting that is not controlled by your nausea medication, call the clinic.   BELOW ARE SYMPTOMS THAT SHOULD BE REPORTED IMMEDIATELY:  *FEVER GREATER THAN 100.5 F  *CHILLS WITH OR WITHOUT FEVER  NAUSEA AND VOMITING THAT IS NOT CONTROLLED WITH YOUR NAUSEA MEDICATION  *UNUSUAL SHORTNESS OF BREATH  *UNUSUAL BRUISING OR BLEEDING  TENDERNESS IN MOUTH AND THROAT WITH OR WITHOUT PRESENCE OF ULCERS  *URINARY PROBLEMS  *BOWEL PROBLEMS  UNUSUAL RASH Items with * indicate a potential emergency and should be followed up as soon as possible.  Feel free to call the clinic should you have any questions or concerns. The clinic phone number is [phone].  Please show the CHEMO ALERT CARD at check-in to the Emergency Department and triage nurse.   

## 2018-09-18 NOTE — Assessment & Plan Note (Signed)
1.  IgG lambda multiple myeloma, stage III, del 17p: - Presentation with lower back pain since March, presented to the emergency room on 10/25/2017 with right groin pain, CT scan showed multiple lytic lesions, multiple lytic lesions on skeletal survey - SPEP shows 1.5 g/dL of monoclonal protein, beta-2 microglobulin of 4.8, free lambda light chains of 2869, kappa by lambda ratio of less than 0, elevated LDH. 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  FISH panel shows -14, -12, 13 q.-/-13 and 17p-.  Bone marrow biopsy shows 90% plasma cells. -4 cycles of RVD from 11/01/2017 through 01/14/2018.  Labs done on 01/20/2018 showed M spike to be negative. - She was evaluated by Dr. Laverta Baltimore at [facility].  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 - Because of her high risk disease, consolidation with 2 cycles of chemotherapy was done with RVD from 06/11/2018 through 07/01/2018.  -Maintenance Velcade every 2 weeks and Revlimid 10 mg 3 weeks on 1 week off started on 07/29/2018.   - She was hospitalized with mild fever and respiratory symptoms including wheezing from 08/09/2018 through 08/11/2018.  A nasal swab apparently was positive for RSV.  - Myeloma panel at [facility] on 08/18/2018 was negative. - She is tolerating Velcade very well.  Denies any worsening of her peripheral neuropathy.  She is also tolerating Revlimid very well.  I have sent a prescription refill for Revlimid. - I will see her back in 4 weeks for follow-up.  We will plan to repeat her myeloma panel.  2.  Bone strengthening agents: -She will continue zoledronic acid every 3 months. -Last dose was on 08/27/2018.  I have monitored her calcium which was within normal limits.  She will continue calcium supplements.  3.  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 -She will continue Lyrica 200 mg twice  daily.  4.  ID prophylaxis: -She will continue acyclovir twice daily for shingles prophylaxis.  5.  Hypomagnesemia: -She is taking magnesium 400 mg 1 tablets twice daily.  Magnesium level today is normal at 1.7.  6.  Diabetes: - She is continuing metformin thousand milligrams twice daily. -She was recently started on Trulicity and Lantus 7 units at bedtime.

## 2018-09-18 NOTE — Progress Notes (Signed)
Patient seen by Dr. Delton Coombes with lab review and ok to treat verbal order.  No complaints voiced and no s/s of distress noted.   Patient tolerated injection with no complaints voiced.  Site clean and dry with no bruising or swelling noted at site.  Band aid applied.  Vss with discharge and left ambulatory with no s/s of distress noted.

## 2018-09-18 NOTE — Progress Notes (Signed)
[place], [place] [id number]   CLINIC:  Medical Oncology/Hematology  PCP:  Mikey Kirschner, MD [address] [place] [address] [phone]   REASON FOR VISIT:  Follow-up for multiple myeloma  CURRENT THERAPY:Maintenance Velcade and Revlimid  BRIEF ONCOLOGIC HISTORY:    Multiple myeloma ([place])   10/29/2017 Initial Diagnosis    Multiple myeloma ([place])    11/01/2017 - 01/24/2018 Chemotherapy    The patient had bortezomib SQ (VELCADE) chemo injection 3 mg, 1.3 mg/m2 = 3 mg, Subcutaneous,  Once, 5 of 5 cycles Administration: 3 mg (11/01/2017), 3 mg (11/08/2017), 3 mg (11/05/2017), 3 mg (11/22/2017), 3 mg (11/12/2017), 3 mg (11/29/2017), 3 mg (11/26/2017), 3 mg (12/03/2017), 3 mg (12/13/2017), 3 mg (12/20/2017), 3 mg (12/17/2017), 3 mg (12/24/2017), 3 mg (01/03/2018), 3 mg (01/10/2018), 3 mg (01/07/2018), 3 mg (01/14/2018), 3 mg (01/24/2018)  for chemotherapy treatment.     06/11/2018 -  Chemotherapy    The patient had bortezomib SQ (VELCADE) chemo injection 3 mg, 1.3 mg/m2 = 3 mg, Subcutaneous,  Once, 10 of 14 cycles Administration: 3 mg (06/11/2018), 3 mg (07/29/2018), 3 mg (08/21/2018), 3 mg (09/04/2018), 3 mg (09/18/2018), 3 mg (06/18/2018), 3 mg (06/25/2018), 3 mg (07/01/2018), 3 mg (07/08/2018), 3 mg (07/15/2018)  for chemotherapy treatment.       CANCER STAGING: Cancer Staging Multiple myeloma ([place]) Staging form: Plasma Cell Myeloma and Plasma Cell Disorders, AJCC 8th Edition - Clinical: No stage assigned - Unsigned    INTERVAL HISTORY:  Joanna Reid 53 y.o. female returns for routine follow-up and consideration for next cycle of chemotherapy. She is here today by herself. She states that her neuropathy is stable. She states that she has not had any recent ER visits. Denies any nausea, vomiting, or diarrhea. Denies any new pains. Had not noticed any recent bleeding such as epistaxis, hematuria or hematochezia. Denies recent chest pain on exertion,  shortness of breath on minimal exertion, pre-syncopal episodes, or palpitations. Denies any recent fevers, infections, or recent hospitalizations. Patient reports appetite at 100% and energy level at 50-75%.   Overall, she feels ready for next cycle of chemo today.     REVIEW OF SYSTEMS:  Review of Systems  Neurological: Positive for numbness.  All other systems reviewed and are negative.    PAST MEDICAL/SURGICAL HISTORY:  Past Medical History:  Diagnosis Date  . Acid reflux   . Allergic rhinitis   . Cancer ([place])    multiple myeloma  . Diabetes mellitus    type 2  . Gout   . Gout   . HBP (high blood pressure)   . History of kidney stones   . Migraines    Past Surgical History:  Procedure Laterality Date  . CESAREAN SECTION    . EXTRACORPOREAL SHOCK WAVE LITHOTRIPSY Left 10/10/2017   Procedure: LEFT EXTRACORPOREAL SHOCK WAVE LITHOTRIPSY (ESWL);  Surgeon: Bjorn Loser, MD;  Location: [facility];  Service: Urology;  Laterality: Left;  . EYE SURGERY    . HEMORRHOID SURGERY N/A 11/19/2012   Procedure: HEMORRHOIDECTOMY;  Surgeon: Jamesetta So, MD;  Location: [facility];  Service: General;  Laterality: N/A;  . kidney stones  1998  . LAPAROSCOPIC UNILATERAL SALPINGO OOPHERECTOMY  05/14/2012   Procedure: LAPAROSCOPIC UNILATERAL SALPINGO OOPHORECTOMY;  Surgeon: [name], MD;  Location: [facility];  Service: Gynecology;  Laterality: Right;  laparoscopic right salpingo-oophorectomy  . PARTIAL HYSTERECTOMY    . TONSILECTOMY, ADENOIDECTOMY, BILATERAL MYRINGOTOMY AND TUBES    .  VESICOVAGINAL FISTULA CLOSURE W/ TAH       SOCIAL HISTORY:  Social History   Socioeconomic History  . Marital status: Married    Spouse name: Not on file  . Number of children: Not on file  . Years of education: 44  . Highest education level: Not on file  Occupational History    Employer: [employer]  . Financial resource strain: Not on file  . Food insecurity:    Worry: Never true     Inability: Never true  . Transportation needs:    Medical: Not on file    Non-medical: Not on file  Tobacco Use  . Smoking status: Former Smoker    Last attempt to quit: 02/27/1999    Years since quitting: 19.5  . Smokeless tobacco: Never Used  . Tobacco comment: socially   Substance and Sexual Activity  . Alcohol use: No    Alcohol/week: 0.0 standard drinks  . Drug use: No  . Sexual activity: Yes  Lifestyle  . Physical activity:    Days per week: Not on file    Minutes per session: Not on file  . Stress: Not on file  Relationships  . Social connections:    Talks on phone: Not on file    Gets together: Not on file    Attends religious service: Not on file    Active member of club or organization: Not on file    Attends meetings of clubs or organizations: Not on file    Relationship status: Not on file  . Intimate partner violence:    Fear of current or ex partner: Not on file    Emotionally abused: Not on file    Physically abused: Not on file    Forced sexual activity: Not on file  Other Topics Concern  . Not on file  Social History Narrative  . Not on file    FAMILY HISTORY:  Family History  Problem Relation Age of Onset  . Arthritis Other   . Cancer Other   . Diabetes Other   . Hypertension Mother   . Dementia Mother   . Diabetes Father   . ALS Father   . Diabetes Brother   . Hypertension Brother   . Cancer Paternal Aunt   . COPD Maternal Grandmother   . Cancer Maternal Grandfather   . Anesthesia problems Paternal Grandfather     CURRENT MEDICATIONS:  Outpatient Encounter Medications as of 09/18/2018  Medication Sig Note  . acyclovir (ZOVIRAX) 800 MG tablet Take 800 mg by mouth 2 (two) times daily.    Marland Kitchen albuterol (PROVENTIL HFA;VENTOLIN HFA) 108 (90 Base) MCG/ACT inhaler Inhale 2 puffs into the lungs every 4 (four) hours as needed for wheezing or shortness of breath.   Marland Kitchen aspirin EC 81 MG tablet Take 81 mg by mouth daily.   . Bortezomib (VELCADE IJ)  Inject as directed every 14 (fourteen) days.  08/09/2018: Due for next injection on 08/13/2018  . bumetanide (BUMEX) 1 MG tablet Take 1 tablet (1 mg total) by mouth every morning.   . carvedilol (COREG) 6.25 MG tablet Take 6.25 mg by mouth 2 (two) times daily with a meal.    . diazepam (VALIUM) 5 MG tablet TAKE 1 TABLET BY MOUTH AT BEDTIME AS NEEDED FOR ANXIETY OR INSOMNIA (Patient taking differently: Take 5 mg by mouth at bedtime. )   . docusate sodium (COLACE) 100 MG capsule Take 100 mg by mouth daily.    . fluticasone (FLONASE)  50 MCG/ACT nasal spray Place 2 sprays into the nose 2 (two) times daily.   Marland Kitchen glucose blood (PRECISION QID TEST) test strip Use 1 each (1 strip total) 4 (four) times daily Use as instructed. OneTouch Verio   . insulin glargine (LANTUS) 100 UNIT/ML injection Inject 7 Units into the skin at bedtime.   Marland Kitchen lenalidomide (REVLIMID) 10 MG capsule Take for 3 weeks then take one-week off   . losartan (COZAAR) 100 MG tablet Take 1 tablet (100 mg total) by mouth every morning.   . magnesium oxide (MAG-OX) 400 (241.3 Mg) MG tablet Take 1 tablet (400 mg total) by mouth 2 (two) times daily.   . metFORMIN (GLUCOPHAGE) 500 MG tablet TAKE 1 TABLET BY MOUTH ONCE DAILY WITH BREAKFAST (Patient taking differently: Take 1,000 mg by mouth 2 (two) times daily with a meal. )   . potassium chloride SA (K-DUR,KLOR-CON) 20 MEQ tablet Take 20 mEq by mouth daily.    . pregabalin (LYRICA) 200 MG capsule Take 1 capsule (200 mg total) by mouth 2 (two) times daily.   . prochlorperazine (COMPAZINE) 10 MG tablet Take 1 tablet (10 mg total) by mouth every 6 (six) hours as needed for nausea or vomiting. 08/09/2018: Takes prior to Velcade injection  . rosuvastatin (CRESTOR) 5 MG tablet Take 5 mg by mouth daily.    . TRULICITY 9.56 LO/7.5IE SOPN Inject 0.75 mg into the skin every Monday.    . [DISCONTINUED] allopurinol (ZYLOPRIM) 300 MG tablet TAKE 1 TABLET BY MOUTH  DAILY FOR GOUT (Patient taking differently: Take  300 mg by mouth every morning. )   . [DISCONTINUED] lenalidomide (REVLIMID) 10 MG capsule Take for 3 weeks then take one-week off   . [DISCONTINUED] omeprazole (PRILOSEC) 20 MG capsule TAKE 1 CAPSULE BY MOUTH  DAILY (Patient taking differently: Take 20 mg by mouth every morning. )   . [DISCONTINUED] verapamil (CALAN-SR) 240 MG CR tablet TAKE 1 TABLET BY MOUTH AT  BEDTIME (Patient taking differently: Take 240 mg by mouth at bedtime. )   . ondansetron (ZOFRAN) 8 MG tablet Take 1 tablet (8 mg total) by mouth every 8 (eight) hours as needed. (Patient not taking: Reported on 09/18/2018)   . [DISCONTINUED] ciprofloxacin (CIPRO) 500 MG tablet Take 1 tablet (500 mg total) by mouth 2 (two) times daily.   . [DISCONTINUED] dextromethorphan (DELSYM) 30 MG/5ML liquid Take 10 mLs (60 mg total) by mouth 2 (two) times daily as needed for cough.   . [DISCONTINUED] fluconazole (DIFLUCAN) 150 MG tablet Take 1 tablet (150 mg total) by mouth once as needed for up to 1 dose (yeast infection). Repeat if needed   . [DISCONTINUED] HUMALOG KWIKPEN 100 UNIT/ML KwikPen Inject 2-6 Units into the skin 3 (three) times daily before meals. Per sliding scale for blood sugar levels over 200   . [DISCONTINUED] nitrofurantoin, macrocrystal-monohydrate, (MACROBID) 100 MG capsule Take 1 capsule (100 mg total) by mouth 2 (two) times daily.    No facility-administered encounter medications on file as of 09/18/2018.     ALLERGIES:  No Known Allergies   PHYSICAL EXAM:  ECOG Performance status: 1  Vitals:   09/18/18 1004  BP: 126/84  Pulse: 65  Resp: 18  Temp: 98.1 F (36.7 C)  SpO2: 100%   Filed Weights   09/18/18 1004  Weight: 267 lb 8 oz (121.3 kg)    Physical Exam Constitutional:      Appearance: Normal appearance.  Cardiovascular:     Rate and Rhythm: Normal rate and  regular rhythm.  Pulmonary:     Effort: Pulmonary effort is normal.     Breath sounds: Normal breath sounds.  Abdominal:     General: Bowel sounds  are normal. There is no distension.     Palpations: Abdomen is soft.  Musculoskeletal:        General: No swelling.  Skin:    General: Skin is warm.  Neurological:     General: No focal deficit present.     Mental Status: She is alert and oriented to person, place, and time.  Psychiatric:        Mood and Affect: Mood normal.        Behavior: Behavior normal.      LABORATORY DATA:  I have reviewed the labs as listed.  CBC    Component Value Date/Time   WBC 3.7 (L) 09/18/2018 0937   RBC 3.36 (L) 09/18/2018 0937   HGB 10.4 (L) 09/18/2018 0937   HGB 14.2 08/02/2015 1113   HCT 31.4 (L) 09/18/2018 0937   HCT 41.1 08/02/2015 1113   PLT 203 09/18/2018 0937   PLT 371 08/02/2015 1113   MCV 93.5 09/18/2018 0937   MCV 84 08/02/2015 1113   MCH 31.0 09/18/2018 0937   MCHC 33.1 09/18/2018 0937   RDW 15.1 09/18/2018 0937   RDW 15.1 08/02/2015 1113   LYMPHSABS 1.0 09/18/2018 0937   LYMPHSABS 2.9 08/02/2015 1113   MONOABS 0.5 09/18/2018 0937   EOSABS 0.3 09/18/2018 0937   EOSABS 0.2 08/02/2015 1113   BASOSABS 0.0 09/18/2018 0937   BASOSABS 0.1 08/02/2015 1113   CMP Latest Ref Rng & Units 09/18/2018 09/04/2018 08/27/2018  Glucose 70 - 99 mg/dL 136(H) 114(H) 128(H)  BUN 6 - 20 mg/dL 16 19 21(H)  Creatinine 0.44 - 1.00 mg/dL 1.17(H) 1.32(H) 1.04(H)  Sodium 135 - 145 mmol/L 140 141 140  Potassium 3.5 - 5.1 mmol/L 2.9(L) 3.6 3.8  Chloride 98 - 111 mmol/L 102 104 104  CO2 22 - 32 mmol/L _0 Calcium 8.9 - 10.3 mg/dL 9.5 9.9 9.5  Total Protein 6.5 - 8.1 g/dL 6.8 7.1 7.2  Total Bilirubin 0.3 - 1.2 mg/dL 0.7 0.5 0.9  Alkaline Phos 38 - 126 U/L 66 71 60  AST 15 - 41 U/L 25 32 33  ALT 0 - 44 U/L _1 DIAGNOSTIC IMAGING:  I have independently reviewed the scans and discussed with the patient.   I have reviewed Venita Lick LPN's note and agree with the documentation.  I personally performed a face-to-face visit, made revisions and my assessment and plan is as follows.     ASSESSMENT & PLAN:   Multiple myeloma ([place]) 1.  IgG lambda multiple myeloma, stage III, del 17p: - Presentation with lower back pain since March, presented to the emergency room on 10/25/2017 with right groin pain, CT scan showed multiple lytic lesions, multiple lytic lesions on skeletal survey - SPEP shows 1.5 g/dL of monoclonal protein, beta-2 microglobulin of 4.8, free lambda light chains of 2869, kappa by lambda ratio of less than 0, elevated LDH. 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  FISH panel shows -14, -12, 13 q.-/-13 and 17p-.  Bone marrow biopsy shows 90% plasma cells. -4 cycles of RVD from 11/01/2017 through 01/14/2018.  Labs done on 01/20/2018 showed M spike to be negative. - She was evaluated by Dr. Laverta Baltimore at [facility].  She received melphalan 200 mg/m on 02/19/2018 followed by auto  stem cell transplant on 02/20/2018. - Because of her high risk disease, consolidation with 2 cycles of chemotherapy was done with RVD from 06/11/2018 through 07/01/2018.  -Maintenance Velcade every 2 weeks and Revlimid 10 mg 3 weeks on 1 week off started on 07/29/2018.   - She was hospitalized with mild fever and respiratory symptoms including wheezing from 08/09/2018 through 08/11/2018.  A nasal swab apparently was positive for RSV.  - Myeloma panel at [facility] on 08/18/2018 was negative. - She is tolerating Velcade very well.  Denies any worsening of her peripheral neuropathy.  She is also tolerating Revlimid very well.  I have sent a prescription refill for Revlimid. - I will see her back in 4 weeks for follow-up.  We will plan to repeat her myeloma panel.  2.  Bone strengthening agents: -She will continue zoledronic acid every 3 months. -Last dose was on 08/27/2018.  I have monitored her calcium which was within normal limits.  She will continue calcium supplements.  3.  Peripheral neuropathy: -She developed peripheral neuropathy in the legs after  transplant. -She will continue Lyrica 200 mg twice daily.  4.  ID prophylaxis: -She will continue acyclovir twice daily for shingles prophylaxis.  5.  Hypomagnesemia: -She is taking magnesium 400 mg 1 tablets twice daily.  Magnesium level today is normal at 1.7.  6.  Diabetes: - She is continuing metformin thousand milligrams twice daily. -She was recently started on Trulicity and Lantus 7 units at bedtime.      Orders placed this encounter:  Orders Placed This Encounter  Procedures  . CBC with Differential/Platelet  . Comprehensive metabolic panel  . Magnesium      Derek Jack, MD [employer] [phone]

## 2018-09-18 NOTE — Telephone Encounter (Signed)
Okay +5 monthly refills

## 2018-10-02 ENCOUNTER — Other Ambulatory Visit (HOSPITAL_COMMUNITY): Payer: Self-pay

## 2018-10-02 ENCOUNTER — Inpatient Hospital Stay (HOSPITAL_COMMUNITY): Payer: BLUE CROSS/BLUE SHIELD

## 2018-10-02 ENCOUNTER — Ambulatory Visit (HOSPITAL_COMMUNITY): Payer: Self-pay

## 2018-10-02 ENCOUNTER — Other Ambulatory Visit: Payer: Self-pay

## 2018-10-02 VITALS — BP 130/75 | HR 65 | Temp 97.8°F | Resp 18 | Wt 268.6 lb

## 2018-10-02 DIAGNOSIS — C9000 Multiple myeloma not having achieved remission: Secondary | ICD-10-CM | POA: Diagnosis not present

## 2018-10-02 DIAGNOSIS — Z5112 Encounter for antineoplastic immunotherapy: Secondary | ICD-10-CM | POA: Diagnosis not present

## 2018-10-02 LAB — CBC WITH DIFFERENTIAL/PLATELET
Abs Immature Granulocytes: 0.03 10*3/uL (ref 0.00–0.07)
Basophils Absolute: 0.1 10*3/uL (ref 0.0–0.1)
Basophils Relative: 2 %
EOS ABS: 0.2 10*3/uL (ref 0.0–0.5)
Eosinophils Relative: 5 %
HCT: 31 % — ABNORMAL LOW (ref 36.0–46.0)
Hemoglobin: 10.3 g/dL — ABNORMAL LOW (ref 12.0–15.0)
IMMATURE GRANULOCYTES: 1 %
Lymphocytes Relative: 25 %
Lymphs Abs: 0.8 10*3/uL (ref 0.7–4.0)
MCH: 31.1 pg (ref 26.0–34.0)
MCHC: 33.2 g/dL (ref 30.0–36.0)
MCV: 93.7 fL (ref 80.0–100.0)
Monocytes Absolute: 0.2 10*3/uL (ref 0.1–1.0)
Monocytes Relative: 7 %
NEUTROS PCT: 60 %
Neutro Abs: 2 10*3/uL (ref 1.7–7.7)
Platelets: 182 10*3/uL (ref 150–400)
RBC: 3.31 MIL/uL — ABNORMAL LOW (ref 3.87–5.11)
RDW: 15.2 % (ref 11.5–15.5)
WBC: 3.3 10*3/uL — ABNORMAL LOW (ref 4.0–10.5)
nRBC: 0 % (ref 0.0–0.2)

## 2018-10-02 LAB — COMPREHENSIVE METABOLIC PANEL
ALT: 23 U/L (ref 0–44)
AST: 32 U/L (ref 15–41)
Albumin: 4 g/dL (ref 3.5–5.0)
Alkaline Phosphatase: 68 U/L (ref 38–126)
Anion gap: 11 (ref 5–15)
BUN: 20 mg/dL (ref 6–20)
CO2: 22 mmol/L (ref 22–32)
Calcium: 9.2 mg/dL (ref 8.9–10.3)
Chloride: 108 mmol/L (ref 98–111)
Creatinine, Ser: 1.2 mg/dL — ABNORMAL HIGH (ref 0.44–1.00)
GFR calc Af Amer: 59 mL/min — ABNORMAL LOW (ref >60)
GFR calc non Af Amer: 51 mL/min — ABNORMAL LOW (ref >60)
Glucose, Bld: 142 mg/dL — ABNORMAL HIGH (ref 70–99)
Potassium: 3.3 mmol/L — ABNORMAL LOW (ref 3.5–5.1)
Sodium: 141 mmol/L (ref 135–145)
TOTAL PROTEIN: 7 g/dL (ref 6.5–8.1)
Total Bilirubin: 0.5 mg/dL (ref 0.3–1.2)

## 2018-10-02 LAB — MAGNESIUM: Magnesium: 1.4 mg/dL — ABNORMAL LOW (ref 1.7–2.4)

## 2018-10-02 MED ORDER — MAGNESIUM SULFATE 2 GM/50ML IV SOLN: 2.0000 g | Freq: Once | INTRAVENOUS | Status: DC

## 2018-10-02 MED ORDER — SODIUM CHLORIDE 0.9 % IV SOLN: 2.0000 g | Freq: Once | INTRAVENOUS | Status: DC

## 2018-10-02 MED ORDER — MAGNESIUM SULFATE 2 GM/50ML IV SOLN
2.0000 g | Freq: Once | INTRAVENOUS | Status: AC
  Administered 2018-10-02: 2 g via INTRAVENOUS
  Filled 2018-10-02 (×2): qty 50

## 2018-10-02 MED ORDER — SODIUM CHLORIDE 0.9 % IV SOLN
Freq: Once | INTRAVENOUS | Status: AC
  Administered 2018-10-02: 11:00:00 via INTRAVENOUS

## 2018-10-02 MED ORDER — BORTEZOMIB CHEMO SQ INJECTION 3.5 MG (2.5MG/ML)
1.3000 mg/m2 | Freq: Once | INTRAMUSCULAR | Status: AC
  Administered 2018-10-02: 3 mg via SUBCUTANEOUS
  Filled 2018-10-02: qty 1.2

## 2018-10-02 MED ORDER — PROCHLORPERAZINE MALEATE 10 MG PO TABS: 10.0000 mg | ORAL_TABLET | Freq: Once | ORAL | Status: DC

## 2018-10-02 MED ORDER — MAGNESIUM OXIDE 400 (241.3 MG) MG PO TABS: 400.0000 mg | ORAL_TABLET | Freq: Three times a day (TID) | ORAL | 3 refills | Status: AC

## 2018-10-02 NOTE — Progress Notes (Signed)
Treatment given per orders. Patient tolerated it well without problems. Vitals stable and discharged home from clinic ambulatory. Follow up as scheduled.  

## 2018-10-02 NOTE — Progress Notes (Signed)
Pt presents today for Velcade injection. Labs drawn. VSS. Pt has no complaints of any changes since the last visit. Pt taking Revlimid as prescribed. Labs within parameters for treatment. K+ and magnesium out of range. Message sent to Dr. Delton Coombes and Aviva Signs LPN.

## 2018-10-09 ENCOUNTER — Other Ambulatory Visit (HOSPITAL_COMMUNITY): Payer: Self-pay | Admitting: Hematology

## 2018-10-09 DIAGNOSIS — C9000 Multiple myeloma not having achieved remission: Secondary | ICD-10-CM

## 2018-10-15 ENCOUNTER — Other Ambulatory Visit: Payer: Self-pay

## 2018-10-16 ENCOUNTER — Inpatient Hospital Stay (HOSPITAL_COMMUNITY): Payer: BLUE CROSS/BLUE SHIELD | Admitting: Hematology

## 2018-10-16 ENCOUNTER — Encounter (HOSPITAL_COMMUNITY): Payer: Self-pay | Admitting: Hematology

## 2018-10-16 ENCOUNTER — Other Ambulatory Visit: Payer: Self-pay

## 2018-10-16 ENCOUNTER — Inpatient Hospital Stay (HOSPITAL_COMMUNITY): Payer: BLUE CROSS/BLUE SHIELD | Attending: Hematology

## 2018-10-16 ENCOUNTER — Inpatient Hospital Stay (HOSPITAL_COMMUNITY): Payer: BLUE CROSS/BLUE SHIELD

## 2018-10-16 ENCOUNTER — Other Ambulatory Visit (HOSPITAL_COMMUNITY): Payer: Self-pay

## 2018-10-16 ENCOUNTER — Ambulatory Visit (HOSPITAL_COMMUNITY): Payer: Self-pay | Admitting: Hematology

## 2018-10-16 ENCOUNTER — Ambulatory Visit (HOSPITAL_COMMUNITY): Payer: Self-pay

## 2018-10-16 VITALS — BP 131/80 | HR 73 | Temp 98.7°F | Resp 18 | Wt 272.5 lb

## 2018-10-16 DIAGNOSIS — T451X5A Adverse effect of antineoplastic and immunosuppressive drugs, initial encounter: Secondary | ICD-10-CM

## 2018-10-16 DIAGNOSIS — G620 Drug-induced polyneuropathy: Secondary | ICD-10-CM

## 2018-10-16 DIAGNOSIS — Z9484 Stem cells transplant status: Secondary | ICD-10-CM

## 2018-10-16 DIAGNOSIS — C9000 Multiple myeloma not having achieved remission: Secondary | ICD-10-CM

## 2018-10-16 DIAGNOSIS — Z5112 Encounter for antineoplastic immunotherapy: Secondary | ICD-10-CM | POA: Diagnosis not present

## 2018-10-16 DIAGNOSIS — Z79899 Other long term (current) drug therapy: Secondary | ICD-10-CM

## 2018-10-16 DIAGNOSIS — Z87891 Personal history of nicotine dependence: Secondary | ICD-10-CM

## 2018-10-16 DIAGNOSIS — R197 Diarrhea, unspecified: Secondary | ICD-10-CM

## 2018-10-16 DIAGNOSIS — E119 Type 2 diabetes mellitus without complications: Secondary | ICD-10-CM

## 2018-10-16 LAB — COMPREHENSIVE METABOLIC PANEL
ALT: 27 U/L (ref 0–44)
AST: 32 U/L (ref 15–41)
Albumin: 3.7 g/dL (ref 3.5–5.0)
Alkaline Phosphatase: 76 U/L (ref 38–126)
Anion gap: 12 (ref 5–15)
BUN: 18 mg/dL (ref 6–20)
CO2: 23 mmol/L (ref 22–32)
Calcium: 8.3 mg/dL — ABNORMAL LOW (ref 8.9–10.3)
Chloride: 103 mmol/L (ref 98–111)
Creatinine, Ser: 1.01 mg/dL — ABNORMAL HIGH (ref 0.44–1.00)
GFR calc Af Amer: >60 mL/min (ref >60)
GFR calc non Af Amer: >60 mL/min (ref >60)
Glucose, Bld: 184 mg/dL — ABNORMAL HIGH (ref 70–99)
Potassium: 3.4 mmol/L — ABNORMAL LOW (ref 3.5–5.1)
Sodium: 138 mmol/L (ref 135–145)
Total Bilirubin: 0.7 mg/dL (ref 0.3–1.2)
Total Protein: 6.6 g/dL (ref 6.5–8.1)

## 2018-10-16 LAB — CBC WITH DIFFERENTIAL/PLATELET
Abs Immature Granulocytes: 0.02 10*3/uL (ref 0.00–0.07)
Basophils Absolute: 0 10*3/uL (ref 0.0–0.1)
Basophils Relative: 1 %
Eosinophils Absolute: 0.3 10*3/uL (ref 0.0–0.5)
Eosinophils Relative: 11 %
HCT: 30 % — ABNORMAL LOW (ref 36.0–46.0)
Hemoglobin: 9.7 g/dL — ABNORMAL LOW (ref 12.0–15.0)
Immature Granulocytes: 1 %
Lymphocytes Relative: 23 %
Lymphs Abs: 0.7 10*3/uL (ref 0.7–4.0)
MCH: 30.3 pg (ref 26.0–34.0)
MCHC: 32.3 g/dL (ref 30.0–36.0)
MCV: 93.8 fL (ref 80.0–100.0)
Monocytes Absolute: 0.3 10*3/uL (ref 0.1–1.0)
Monocytes Relative: 11 %
Neutro Abs: 1.6 10*3/uL — ABNORMAL LOW (ref 1.7–7.7)
Neutrophils Relative %: 53 %
Platelets: 153 10*3/uL (ref 150–400)
RBC: 3.2 MIL/uL — ABNORMAL LOW (ref 3.87–5.11)
RDW: 15.3 % (ref 11.5–15.5)
WBC: 3 10*3/uL — ABNORMAL LOW (ref 4.0–10.5)
nRBC: 0 % (ref 0.0–0.2)

## 2018-10-16 LAB — MAGNESIUM: Magnesium: 1.2 mg/dL — ABNORMAL LOW (ref 1.7–2.4)

## 2018-10-16 MED ORDER — MAGNESIUM SULFATE 2 GM/50ML IV SOLN
2.0000 g | Freq: Once | INTRAVENOUS | Status: AC
  Administered 2018-10-16: 2 g via INTRAVENOUS
  Filled 2018-10-16: qty 50

## 2018-10-16 MED ORDER — HYDROCODONE-ACETAMINOPHEN 5-325 MG PO TABS: 1.0000 | ORAL_TABLET | Freq: Every evening | ORAL | 0 refills | Status: DC | PRN

## 2018-10-16 MED ORDER — LENALIDOMIDE 10 MG PO CAPS: ORAL_CAPSULE | ORAL | 0 refills | Status: DC

## 2018-10-16 MED ORDER — SODIUM CHLORIDE 0.9 % IV SOLN: 2.0000 g | Freq: Once | INTRAVENOUS | Status: DC

## 2018-10-16 MED ORDER — PROCHLORPERAZINE MALEATE 10 MG PO TABS: 10.0000 mg | ORAL_TABLET | Freq: Once | ORAL | Status: DC

## 2018-10-16 MED ORDER — BORTEZOMIB CHEMO SQ INJECTION 3.5 MG (2.5MG/ML)
1.3000 mg/m2 | Freq: Once | INTRAMUSCULAR | Status: AC
  Administered 2018-10-16: 3 mg via SUBCUTANEOUS
  Filled 2018-10-16: qty 1.2

## 2018-10-16 MED ORDER — PREGABALIN 200 MG PO CAPS: 200.0000 mg | ORAL_CAPSULE | Freq: Two times a day (BID) | ORAL | 0 refills | Status: DC

## 2018-10-16 MED ORDER — SODIUM CHLORIDE 0.9 % IV SOLN
INTRAVENOUS | Status: DC
  Administered 2018-10-16: 10:00:00 via INTRAVENOUS

## 2018-10-16 NOTE — Progress Notes (Signed)
Pt seen today by Dr. Delton Coombes and labs reviewed.  Okay to proceed with Velcade injection today per MD.  Pt will also receive Mg++ 2 g IV.   Tolerated Mg++ infusion w/o incident.  Joanna Reid presents today for injection per the provider's orders.  Velcade administration without incident; see MAR for injection details.  Patient tolerated procedure well and without incident.  No questions or complaints noted at this time.  Discharged ambulatory.

## 2018-10-16 NOTE — Progress Notes (Signed)
[place], [place] [address]   CLINIC:  Medical Oncology/Hematology  PCP:  Mikey Kirschner, MD [place] [address] [phone]   REASON FOR VISIT:  Follow-up for multiple myeloma   CURRENT THERAPY: Revlimid 10 mg 3 weeks on 1 week off, Velcade every 14 days.   BRIEF ONCOLOGIC HISTORY:    Multiple myeloma ([place])   10/29/2017 Initial Diagnosis    Multiple myeloma ([place])    11/01/2017 - 01/24/2018 Chemotherapy    The patient had bortezomib SQ (VELCADE) chemo injection 3 mg, 1.3 mg/m2 = 3 mg, Subcutaneous,  Once, 5 of 5 cycles Administration: 3 mg (11/01/2017), 3 mg (11/08/2017), 3 mg (11/05/2017), 3 mg (11/22/2017), 3 mg (11/12/2017), 3 mg (11/29/2017), 3 mg (11/26/2017), 3 mg (12/03/2017), 3 mg (12/13/2017), 3 mg (12/20/2017), 3 mg (12/17/2017), 3 mg (12/24/2017), 3 mg (01/03/2018), 3 mg (01/10/2018), 3 mg (01/07/2018), 3 mg (01/14/2018), 3 mg (01/24/2018)  for chemotherapy treatment.     06/11/2018 -  Chemotherapy    The patient had bortezomib SQ (VELCADE) chemo injection 3 mg, 1.3 mg/m2 = 3 mg, Subcutaneous,  Once, 12 of 14 cycles Administration: 3 mg (06/11/2018), 3 mg (07/29/2018), 3 mg (08/21/2018), 3 mg (09/04/2018), 3 mg (09/18/2018), 3 mg (10/02/2018), 3 mg (06/18/2018), 3 mg (06/25/2018), 3 mg (07/01/2018), 3 mg (07/08/2018), 3 mg (07/15/2018)  for chemotherapy treatment.       CANCER STAGING: Cancer Staging Multiple myeloma ([place]) Staging form: Plasma Cell Myeloma and Plasma Cell Disorders, AJCC 8th Edition - Clinical: No stage assigned - Unsigned    INTERVAL HISTORY:  Ms. Rody 53 y.o. female returns for routine follow-up. She is here alone today. She reports that she is doing very well.  She reports that she has been taking the magnesium 3 times daily.  She has had diarrhea every other day since taking the magnesium.  She reports some tiredness near the end of her cycle of Revlimid and she finds herself rest a lot at that time.  She denies any nausea, vomiting.  Denies any new pains. Had not noticed any recent bleeding such as epistaxis, hematuria or hematochezia. Denies recent chest pain on exertion, shortness of breath on minimal exertion, pre-syncopal episodes, or palpitations. Denies any numbness or tingling in hands or feet. Denies any recent fevers, infections, or recent hospitalizations. Patient reports appetite at 100% and energy level at 50%.    REVIEW OF SYSTEMS:  Review of Systems  Gastrointestinal: Positive for diarrhea.  Neurological: Positive for numbness.  All other systems reviewed and are negative.    PAST MEDICAL/SURGICAL HISTORY:  Past Medical History:  Diagnosis Date  . Acid reflux   . Allergic rhinitis   . Cancer ([place])    multiple myeloma  . Diabetes mellitus    type 2  . Gout   . Gout   . HBP (high blood pressure)   . History of kidney stones   . Migraines    Past Surgical History:  Procedure Laterality Date  . CESAREAN SECTION    . EXTRACORPOREAL SHOCK WAVE LITHOTRIPSY Left 10/10/2017   Procedure: LEFT EXTRACORPOREAL SHOCK WAVE LITHOTRIPSY (ESWL);  Surgeon: Bjorn Loser, MD;  Location: [facility];  Service: Urology;  Laterality: Left;  . EYE SURGERY    . HEMORRHOID SURGERY N/A 11/19/2012   Procedure: HEMORRHOIDECTOMY;  Surgeon: Jamesetta So, MD;  Location: [facility];  Service: General;  Laterality: N/A;  . kidney stones  1998  . LAPAROSCOPIC UNILATERAL  SALPINGO OOPHERECTOMY  05/14/2012   Procedure: LAPAROSCOPIC UNILATERAL SALPINGO OOPHORECTOMY;  Surgeon: [name], MD;  Location: [facility];  Service: Gynecology;  Laterality: Right;  laparoscopic right salpingo-oophorectomy  . PARTIAL HYSTERECTOMY    . TONSILECTOMY, ADENOIDECTOMY, BILATERAL MYRINGOTOMY AND TUBES    . VESICOVAGINAL FISTULA CLOSURE W/ TAH       SOCIAL HISTORY:  Social History   Socioeconomic History  . Marital status: Married    Spouse name: Not on file  . Number of children: Not on file  . Years of  education: 29  . Highest education level: Not on file  Occupational History    Employer: [employer]  . Financial resource strain: Not on file  . Food insecurity:    Worry: Never true    Inability: Never true  . Transportation needs:    Medical: Not on file    Non-medical: Not on file  Tobacco Use  . Smoking status: Former Smoker    Last attempt to quit: 02/27/1999    Years since quitting: 19.6  . Smokeless tobacco: Never Used  . Tobacco comment: socially   Substance and Sexual Activity  . Alcohol use: No    Alcohol/week: 0.0 standard drinks  . Drug use: No  . Sexual activity: Yes  Lifestyle  . Physical activity:    Days per week: Not on file    Minutes per session: Not on file  . Stress: Not on file  Relationships  . Social connections:    Talks on phone: Not on file    Gets together: Not on file    Attends religious service: Not on file    Active member of club or organization: Not on file    Attends meetings of clubs or organizations: Not on file    Relationship status: Not on file  . Intimate partner violence:    Fear of current or ex partner: Not on file    Emotionally abused: Not on file    Physically abused: Not on file    Forced sexual activity: Not on file  Other Topics Concern  . Not on file  Social History Narrative  . Not on file    FAMILY HISTORY:  Family History  Problem Relation Age of Onset  . Arthritis Other   . Cancer Other   . Diabetes Other   . Hypertension Mother   . Dementia Mother   . Diabetes Father   . ALS Father   . Diabetes Brother   . Hypertension Brother   . Cancer Paternal Aunt   . COPD Maternal Grandmother   . Cancer Maternal Grandfather   . Anesthesia problems Paternal Grandfather     CURRENT MEDICATIONS:  Outpatient Encounter Medications as of 10/16/2018  Medication Sig Note  . acyclovir (ZOVIRAX) 800 MG tablet Take 800 mg by mouth 2 (two) times daily.    Marland Kitchen albuterol (PROVENTIL HFA;VENTOLIN HFA) 108 (90 Base)  MCG/ACT inhaler Inhale 2 puffs into the lungs every 4 (four) hours as needed for wheezing or shortness of breath.   . allopurinol (ZYLOPRIM) 300 MG tablet TAKE 1 TABLET BY MOUTH  DAILY FOR GOUT   . aspirin EC 81 MG tablet Take 81 mg by mouth daily.   . Bortezomib (VELCADE IJ) Inject as directed every 14 (fourteen) days.  08/09/2018: Due for next injection on 08/13/2018  . bumetanide (BUMEX) 1 MG tablet Take 1 tablet (1 mg total) by mouth every morning.   . carvedilol (COREG) 6.25 MG tablet  Take 6.25 mg by mouth 2 (two) times daily with a meal.    . diazepam (VALIUM) 5 MG tablet Take 1 tablet (5 mg total) by mouth at bedtime.   . docusate sodium (COLACE) 100 MG capsule Take 100 mg by mouth daily.    . fluticasone (FLONASE) 50 MCG/ACT nasal spray Place 2 sprays into the nose 2 (two) times daily.   Marland Kitchen glucose blood (PRECISION QID TEST) test strip Use 1 each (1 strip total) 4 (four) times daily Use as instructed. OneTouch Verio   . insulin glargine (LANTUS) 100 UNIT/ML injection Inject 7 Units into the skin at bedtime.   Marland Kitchen LANTUS SOLOSTAR 100 UNIT/ML Solostar Pen    . lenalidomide (REVLIMID) 10 MG capsule TAKE 1 CAPSULE BY MOUTH  DAILY FOR 21 DAYS, THEN 7  DAYS OFF   . losartan (COZAAR) 100 MG tablet Take 1 tablet (100 mg total) by mouth every morning.   . magnesium oxide (MAG-OX) 400 (241.3 Mg) MG tablet Take 1 tablet (400 mg total) by mouth 3 (three) times daily.   . metFORMIN (GLUCOPHAGE) 500 MG tablet TAKE 1 TABLET BY MOUTH ONCE DAILY WITH BREAKFAST (Patient taking differently: Take 1,000 mg by mouth 2 (two) times daily with a meal. )   . omeprazole (PRILOSEC) 20 MG capsule TAKE 1 CAPSULE BY MOUTH  DAILY   . ondansetron (ZOFRAN) 8 MG tablet Take 1 tablet (8 mg total) by mouth every 8 (eight) hours as needed.   . potassium chloride SA (K-DUR,KLOR-CON) 20 MEQ tablet Take 20 mEq by mouth daily.    . pregabalin (LYRICA) 200 MG capsule Take 1 capsule (200 mg total) by mouth 2 (two) times daily.   .  prochlorperazine (COMPAZINE) 10 MG tablet Take 1 tablet (10 mg total) by mouth every 6 (six) hours as needed for nausea or vomiting. 08/09/2018: Takes prior to Velcade injection  . rosuvastatin (CRESTOR) 5 MG tablet Take 5 mg by mouth daily.    . TRULICITY 5.99 JT/7.0VX SOPN Inject 0.75 mg into the skin every Monday.    . verapamil (CALAN-SR) 240 MG CR tablet TAKE 1 TABLET BY MOUTH AT  BEDTIME   . [DISCONTINUED] lenalidomide (REVLIMID) 10 MG capsule TAKE 1 CAPSULE BY MOUTH  DAILY FOR 21 DAYS, THEN 7  DAYS OFF   . [DISCONTINUED] pregabalin (LYRICA) 200 MG capsule Take 1 capsule (200 mg total) by mouth 2 (two) times daily.   Marland Kitchen HYDROcodone-acetaminophen (NORCO/VICODIN) 5-325 MG tablet Take 1 tablet by mouth at bedtime as needed for moderate pain.   . [DISCONTINUED] metFORMIN (GLUCOPHAGE-XR) 500 MG 24 hr tablet     Facility-Administered Encounter Medications as of 10/16/2018  Medication Note  . [DISCONTINUED] magnesium sulfate 2 g in sodium chloride 0.9 % 500 mL 10/16/2018: change to 2 gm premix bag    ALLERGIES:  No Known Allergies   PHYSICAL EXAM:  ECOG Performance status: 1  Vitals:   10/16/18 0850  BP: 131/80  Pulse: 73  Resp: 18  Temp: 98.7 F (37.1 C)  SpO2: 100%   Filed Weights   10/16/18 0850  Weight: 272 lb 8 oz (123.6 kg)    Physical Exam Vitals signs reviewed.  Constitutional:      Appearance: Normal appearance.  Cardiovascular:     Rate and Rhythm: Normal rate and regular rhythm.     Heart sounds: Normal heart sounds.  Pulmonary:     Effort: Pulmonary effort is normal.     Breath sounds: Normal breath sounds.  Abdominal:  General: There is no distension.     Palpations: Abdomen is soft. There is no mass.  Musculoskeletal:        General: No swelling.  Skin:    General: Skin is warm.  Neurological:     General: No focal deficit present.     Mental Status: She is alert and oriented to person, place, and time.  Psychiatric:        Mood and Affect: Mood  normal.        Behavior: Behavior normal.      LABORATORY DATA:  I have reviewed the labs as listed.  CBC    Component Value Date/Time   WBC 3.0 (L) 10/16/2018 0838   RBC 3.20 (L) 10/16/2018 0838   HGB 9.7 (L) 10/16/2018 0838   HGB 14.2 08/02/2015 1113   HCT 30.0 (L) 10/16/2018 0838   HCT 41.1 08/02/2015 1113   PLT 153 10/16/2018 0838   PLT 371 08/02/2015 1113   MCV 93.8 10/16/2018 0838   MCV 84 08/02/2015 1113   MCH 30.3 10/16/2018 0838   MCHC 32.3 10/16/2018 0838   RDW 15.3 10/16/2018 0838   RDW 15.1 08/02/2015 1113   LYMPHSABS 0.7 10/16/2018 0838   LYMPHSABS 2.9 08/02/2015 1113   MONOABS 0.3 10/16/2018 0838   EOSABS 0.3 10/16/2018 0838   EOSABS 0.2 08/02/2015 1113   BASOSABS 0.0 10/16/2018 0838   BASOSABS 0.1 08/02/2015 1113   CMP Latest Ref Rng & Units 10/16/2018 10/02/2018 09/18/2018  Glucose 70 - 99 mg/dL 184(H) 142(H) 136(H)  BUN 6 - 20 mg/dL 18 20 16   Creatinine 0.44 - 1.00 mg/dL 1.01(H) 1.20(H) 1.17(H)  Sodium 135 - 145 mmol/L 138 141 140  Potassium 3.5 - 5.1 mmol/L 3.4(L) 3.3(L) 2.9(L)  Chloride 98 - 111 mmol/L 103 108 102  CO2 22 - 32 mmol/L 23 22 26   Calcium 8.9 - 10.3 mg/dL 8.3(L) 9.2 9.5  Total Protein 6.5 - 8.1 g/dL 6.6 7.0 6.8  Total Bilirubin 0.3 - 1.2 mg/dL 0.7 0.5 0.7  Alkaline Phos 38 - 126 U/L 76 68 66  AST 15 - 41 U/L 32 32 25  ALT 0 - 44 U/L 27 23 18        DIAGNOSTIC IMAGING:  I have independently reviewed the scans and discussed with the patient.   I have reviewed Jene Every, RN's note and agree with the documentation.  I personally performed a face-to-face visit, made revisions and my assessment and plan is as follows.    ASSESSMENT & PLAN:   Multiple myeloma ([place]) 1.  IgG lambda multiple myeloma, stage III, del 17p: - Presentation with lower back pain since March, presented to the emergency room on 10/25/2017 with right groin pain, CT scan showed multiple lytic lesions, multiple lytic lesions on skeletal survey - SPEP shows 1.5  g/dL of monoclonal protein, beta-2 microglobulin of 4.8, free lambda light chains of 2869, kappa by lambda ratio of less than 0, elevated LDH. 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  FISH panel shows -14, -12, 13 q.-/-13 and 17p-.  Bone marrow biopsy shows 90% plasma cells. -4 cycles of RVD from 11/01/2017 through 01/14/2018.  Labs done on 01/20/2018 showed M spike to be negative. - She was evaluated by Dr. Laverta Baltimore at [facility].  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 - Because of her high risk disease, consolidation with 2 cycles of chemotherapy was done with RVD from 06/11/2018 through 07/01/2018.  -Maintenance  Velcade every 2 weeks and Revlimid 10 mg 3 weeks on 1 week off started on 07/29/2018.   - She was hospitalized with mild fever and respiratory symptoms including wheezing from 08/09/2018 through 08/11/2018.  A nasal swab apparently was positive for RSV.  - Myeloma panel at [facility] on 08/18/2018 was negative. -She is continuing to tolerate Revlimid 10 mg 3 weeks on 1 week of along with Velcade every other week very well.  Denies any worsening of her neuropathy.  Denies any recent infections.  No worsening of diarrhea reported.  She does have diarrhea every other day.  She will take Imodium as needed. - I have reviewed her blood work.  I will plan to see her back in 4 weeks for follow-up.  I plan to repeat myeloma panel at that time.  2.  Bone strengthening agents: -She will continue zoledronic acid every 3 months. -She will continue calcium and vitamin D.  3.  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  She will continue Lyrica 200 mg twice daily.  4.  ID prophylaxis: -She will continue acyclovir twice daily.  5.  Hypomagnesemia: -Her magnesium is 1.2 today.  She is taking magnesium 3 times a day. -I have told her to increase it to 2 tablets in the  morning, 1 in the noon, 2 in the evening.  6.  Diabetes: - She is continuing metformin thousand milligrams twice daily. -She was recently started on Trulicity and Lantus 7 units at bedtime.      Orders placed this encounter:  Orders Placed This Encounter  Procedures  . CBC with Differential/Platelet  . Comprehensive metabolic panel  . Kappa/lambda light chains  . Protein electrophoresis, serum  . Immunofixation electrophoresis      Derek Jack, MD [place] [phone]

## 2018-10-16 NOTE — Patient Instructions (Signed)
Joanna Reid at [facility] Discharge Instructions  You were seen today by Dr. Delton Coombes. He discussed your lab work.  Your magnesium is still low. He wants you to start taking the magnesium 2 pills in the morning 1 pill mid day and then 2 pills in the evening.  We will give you some iv magnesium today.  We will see you back next month.    Thank you for choosing [facility] at [facility] to provide your oncology and hematology care.  To afford each patient quality time with our provider, please arrive at least 15 minutes before your scheduled appointment time.   If you have a lab appointment with the [facility] please come in thru the  Main Entrance and check in at the main information desk  You need to re-schedule your appointment should you arrive 10 or more minutes late.  We strive to give you quality time with our providers, and arriving late affects you and other patients whose appointments are after yours.  Also, if you no show three or more times for appointments you may be dismissed from the clinic at the providers discretion.     Again, thank you for choosing [facility]  Our hope is that these requests will decrease the amount of time that you wait before being seen by our physicians.       _____________________________________________________________  Should you have questions after your visit to [facility], please contact our office at [phone] between the hours of 8:00 a.m. and 4:30 p.m.  Voicemails left after 4:00 p.m. will not be returned until the following business day.  For prescription refill requests, have your pharmacy contact our office and allow 72 hours.    Cancer Center Support Programs:   > Cancer Support Group  2nd Tuesday of the month 1pm-2pm, Journey Room

## 2018-10-16 NOTE — Assessment & Plan Note (Signed)
1.  IgG lambda multiple myeloma, stage III, del 17p: - Presentation with lower back pain since March, presented to the emergency room on 10/25/2017 with right groin pain, CT scan showed multiple lytic lesions, multiple lytic lesions on skeletal survey - SPEP shows 1.5 g/dL of monoclonal protein, beta-2 microglobulin of 4.8, free lambda light chains of 2869, kappa by lambda ratio of less than 0, elevated LDH. 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  FISH panel shows -14, -12, 13 q.-/-13 and 17p-.  Bone marrow biopsy shows 90% plasma cells. -4 cycles of RVD from 11/01/2017 through 01/14/2018.  Labs done on 01/20/2018 showed M spike to be negative. - She was evaluated by Dr. Laverta Baltimore at [facility].  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 - Because of her high risk disease, consolidation with 2 cycles of chemotherapy was done with RVD from 06/11/2018 through 07/01/2018.  -Maintenance Velcade every 2 weeks and Revlimid 10 mg 3 weeks on 1 week off started on 07/29/2018.   - She was hospitalized with mild fever and respiratory symptoms including wheezing from 08/09/2018 through 08/11/2018.  A nasal swab apparently was positive for RSV.  - Myeloma panel at [facility] on 08/18/2018 was negative. -She is continuing to tolerate Revlimid 10 mg 3 weeks on 1 week of along with Velcade every other week very well.  Denies any worsening of her neuropathy.  Denies any recent infections.  No worsening of diarrhea reported.  She does have diarrhea every other day.  She will take Imodium as needed. - I have reviewed her blood work.  I will plan to see her back in 4 weeks for follow-up.  I plan to repeat myeloma panel at that time.  2.  Bone strengthening agents: -She will continue zoledronic acid every 3 months. -She will continue calcium and vitamin D.  3.  Peripheral neuropathy: -She has bilateral leg neuropathy after  transplant.  She will continue Lyrica 200 mg twice daily.  4.  ID prophylaxis: -She will continue acyclovir twice daily.  5.  Hypomagnesemia: -Her magnesium is 1.2 today.  She is taking magnesium 3 times a day. -I have told her to increase it to 2 tablets in the morning, 1 in the noon, 2 in the evening.  6.  Diabetes: - She is continuing metformin thousand milligrams twice daily. -She was recently started on Trulicity and Lantus 7 units at bedtime.

## 2018-10-28 ENCOUNTER — Other Ambulatory Visit (HOSPITAL_COMMUNITY): Payer: Self-pay | Admitting: Hematology

## 2018-10-28 ENCOUNTER — Other Ambulatory Visit: Payer: Self-pay | Admitting: Family Medicine

## 2018-10-28 DIAGNOSIS — G620 Drug-induced polyneuropathy: Secondary | ICD-10-CM

## 2018-10-28 DIAGNOSIS — C9000 Multiple myeloma not having achieved remission: Secondary | ICD-10-CM

## 2018-10-28 DIAGNOSIS — T451X5A Adverse effect of antineoplastic and immunosuppressive drugs, initial encounter: Secondary | ICD-10-CM

## 2018-10-29 ENCOUNTER — Other Ambulatory Visit: Payer: Self-pay

## 2018-10-29 ENCOUNTER — Inpatient Hospital Stay (HOSPITAL_COMMUNITY): Payer: BLUE CROSS/BLUE SHIELD

## 2018-10-29 ENCOUNTER — Encounter (HOSPITAL_COMMUNITY): Payer: Self-pay

## 2018-10-29 VITALS — BP 127/77 | HR 66 | Temp 97.9°F | Resp 18 | Wt 267.4 lb

## 2018-10-29 DIAGNOSIS — C9000 Multiple myeloma not having achieved remission: Secondary | ICD-10-CM

## 2018-10-29 DIAGNOSIS — Z5112 Encounter for antineoplastic immunotherapy: Secondary | ICD-10-CM | POA: Diagnosis not present

## 2018-10-29 LAB — COMPREHENSIVE METABOLIC PANEL
ALT: 25 U/L (ref 0–44)
AST: 38 U/L (ref 15–41)
Albumin: 4.1 g/dL (ref 3.5–5.0)
Alkaline Phosphatase: 72 U/L (ref 38–126)
Anion gap: 13 (ref 5–15)
BUN: 22 mg/dL — ABNORMAL HIGH (ref 6–20)
CO2: 23 mmol/L (ref 22–32)
Calcium: 9.4 mg/dL (ref 8.9–10.3)
Chloride: 105 mmol/L (ref 98–111)
Creatinine, Ser: 1.05 mg/dL — ABNORMAL HIGH (ref 0.44–1.00)
GFR calc Af Amer: >60 mL/min (ref >60)
GFR calc non Af Amer: >60 mL/min (ref >60)
Glucose, Bld: 148 mg/dL — ABNORMAL HIGH (ref 70–99)
Potassium: 3.8 mmol/L (ref 3.5–5.1)
Sodium: 141 mmol/L (ref 135–145)
Total Bilirubin: 0.6 mg/dL (ref 0.3–1.2)
Total Protein: 7.4 g/dL (ref 6.5–8.1)

## 2018-10-29 LAB — CBC WITH DIFFERENTIAL/PLATELET
Abs Immature Granulocytes: 0.03 10*3/uL (ref 0.00–0.07)
Basophils Absolute: 0.1 10*3/uL (ref 0.0–0.1)
Basophils Relative: 2 %
Eosinophils Absolute: 0.2 10*3/uL (ref 0.0–0.5)
Eosinophils Relative: 4 %
HCT: 33.5 % — ABNORMAL LOW (ref 36.0–46.0)
Hemoglobin: 11.1 g/dL — ABNORMAL LOW (ref 12.0–15.0)
Immature Granulocytes: 1 %
Lymphocytes Relative: 26 %
Lymphs Abs: 1 10*3/uL (ref 0.7–4.0)
MCH: 31.1 pg (ref 26.0–34.0)
MCHC: 33.1 g/dL (ref 30.0–36.0)
MCV: 93.8 fL (ref 80.0–100.0)
Monocytes Absolute: 0.2 10*3/uL (ref 0.1–1.0)
Monocytes Relative: 7 %
Neutro Abs: 2.2 10*3/uL (ref 1.7–7.7)
Neutrophils Relative %: 60 %
Platelets: 219 10*3/uL (ref 150–400)
RBC: 3.57 MIL/uL — ABNORMAL LOW (ref 3.87–5.11)
RDW: 15.6 % — ABNORMAL HIGH (ref 11.5–15.5)
WBC: 3.7 10*3/uL — ABNORMAL LOW (ref 4.0–10.5)
nRBC: 0 % (ref 0.0–0.2)

## 2018-10-29 LAB — URINALYSIS, ROUTINE W REFLEX MICROSCOPIC
Bilirubin Urine: NEGATIVE
Glucose, UA: NEGATIVE mg/dL
Hgb urine dipstick: NEGATIVE
Ketones, ur: NEGATIVE mg/dL
Leukocytes,Ua: NEGATIVE
Nitrite: NEGATIVE
Protein, ur: NEGATIVE mg/dL
Specific Gravity, Urine: 1.009 (ref 1.005–1.030)
pH: 5 (ref 5.0–8.0)

## 2018-10-29 LAB — MAGNESIUM: Magnesium: 1.6 mg/dL — ABNORMAL LOW (ref 1.7–2.4)

## 2018-10-29 MED ORDER — PROCHLORPERAZINE MALEATE 10 MG PO TABS: 10.0000 mg | ORAL_TABLET | Freq: Once | ORAL | Status: DC

## 2018-10-29 MED ORDER — BORTEZOMIB CHEMO SQ INJECTION 3.5 MG (2.5MG/ML)
1.3000 mg/m2 | Freq: Once | INTRAMUSCULAR | Status: AC
  Administered 2018-10-29: 3 mg via SUBCUTANEOUS
  Filled 2018-10-29: qty 1.2

## 2018-10-29 NOTE — Progress Notes (Signed)
1200 Labs reviewed with Dr. Delton Coombes as well as pt's complaint of back discomfort on both sides and strong odor noted with her urine. U/A with micro ordered and pt approved for Velcade injection per MD.    Joanna Reid tolerated Velcade injection well without complaints or incident. Pt to continue taking her Magnesium pills as prescribed and no Magnesium infusion needed IV today per MD. U/A results shown to Dr. Delton Coombes.No new orders obtained Pt discharged self ambulatory in satisfactory condition

## 2018-10-29 NOTE — Patient Instructions (Signed)
[facility] Discharge Instructions for Patients Receiving Chemotherapy   Beginning January 23rd 2017 lab work for the Cancer Center will be done in the  Main lab at [place] on 1st floor. If you have a lab appointment with the Cancer Center please come in thru the  Main Entrance and check in at the main information desk   Today you received the following chemotherapy agents Velcade injection. Follow-up as scheduled. Call clinic for any questions or concerns  To help prevent nausea and vomiting after your treatment, we encourage you to take your nausea medication   If you develop nausea and vomiting, or diarrhea that is not controlled by your medication, call the clinic.  The clinic phone number is [phone]. Office hours are Monday-Friday 8:30am-5:00pm.  BELOW ARE SYMPTOMS THAT SHOULD BE REPORTED IMMEDIATELY:  *FEVER GREATER THAN 101.0 F  *CHILLS WITH OR WITHOUT FEVER  NAUSEA AND VOMITING THAT IS NOT CONTROLLED WITH YOUR NAUSEA MEDICATION  *UNUSUAL SHORTNESS OF BREATH  *UNUSUAL BRUISING OR BLEEDING  TENDERNESS IN MOUTH AND THROAT WITH OR WITHOUT PRESENCE OF ULCERS  *URINARY PROBLEMS  *BOWEL PROBLEMS  UNUSUAL RASH Items with * indicate a potential emergency and should be followed up as soon as possible. If you have an emergency after office hours please contact your primary care physician or go to the nearest emergency department.  Please call the clinic during office hours if you have any questions or concerns.   You may also contact the Patient Navigator at [phone] should you have any questions or need assistance in obtaining follow up care.      Resources For Cancer Patients and their Caregivers ? American Cancer Society: Can assist with transportation, wigs, general needs, runs Look Good Feel Better.        1-888-227-6333 ? Cancer Care: Provides financial assistance, online support groups, medication/co-pay assistance.   1-800-813-HOPE (4673) ? [facility] Assists [place] cancer patients and their families through emotional , educational and financial support.  336-427-4357 ? [place] DSS Where to apply for food stamps, Medicaid and utility assistance. 336-342-1394 ? [facility]: Transportation to medical appointments. 336-347-2287 ? Social Security Administration: May apply for disability if have a Stage IV cancer. 336-342-7796 1-800-772-1213 ? [place] Aging, Disability and Transit Services: Assists with nutrition, care and transit needs. 336-349-2343         

## 2018-10-30 ENCOUNTER — Other Ambulatory Visit (HOSPITAL_COMMUNITY): Payer: Self-pay

## 2018-10-30 ENCOUNTER — Ambulatory Visit (HOSPITAL_COMMUNITY): Payer: Self-pay

## 2018-10-31 MED ORDER — OCTREOTIDE ACETATE 30 MG IM KIT
PACK | INTRAMUSCULAR | Status: AC
  Filled 2018-10-31: qty 1

## 2018-11-04 ENCOUNTER — Other Ambulatory Visit (HOSPITAL_COMMUNITY): Payer: Self-pay | Admitting: Hematology

## 2018-11-04 ENCOUNTER — Other Ambulatory Visit (HOSPITAL_COMMUNITY): Payer: Self-pay | Admitting: *Deleted

## 2018-11-04 DIAGNOSIS — C9000 Multiple myeloma not having achieved remission: Secondary | ICD-10-CM

## 2018-11-04 MED ORDER — LENALIDOMIDE 10 MG PO CAPS: ORAL_CAPSULE | ORAL | 0 refills | Status: DC

## 2018-11-06 ENCOUNTER — Telehealth (HOSPITAL_COMMUNITY): Payer: Self-pay | Admitting: Hematology

## 2018-11-06 NOTE — Telephone Encounter (Signed)
FILLED OUT ENROLLMENT APPS FOR BOTH ZOMETA AND VELCADE. CONTACTED PT AND ADVISED HER TO BRING IN 2019 TAX FORMS

## 2018-11-13 ENCOUNTER — Encounter (HOSPITAL_COMMUNITY): Payer: Self-pay | Admitting: Hematology

## 2018-11-13 ENCOUNTER — Telehealth (HOSPITAL_COMMUNITY): Payer: Self-pay | Admitting: Hematology

## 2018-11-13 ENCOUNTER — Inpatient Hospital Stay (HOSPITAL_BASED_OUTPATIENT_CLINIC_OR_DEPARTMENT_OTHER): Payer: BC Managed Care – PPO | Admitting: Hematology

## 2018-11-13 ENCOUNTER — Inpatient Hospital Stay (HOSPITAL_COMMUNITY): Payer: BC Managed Care – PPO

## 2018-11-13 ENCOUNTER — Other Ambulatory Visit: Payer: Self-pay

## 2018-11-13 ENCOUNTER — Inpatient Hospital Stay (HOSPITAL_COMMUNITY): Payer: BC Managed Care – PPO | Attending: Hematology

## 2018-11-13 DIAGNOSIS — G620 Drug-induced polyneuropathy: Secondary | ICD-10-CM

## 2018-11-13 DIAGNOSIS — Z5112 Encounter for antineoplastic immunotherapy: Secondary | ICD-10-CM | POA: Diagnosis not present

## 2018-11-13 DIAGNOSIS — C9001 Multiple myeloma in remission: Secondary | ICD-10-CM | POA: Diagnosis present

## 2018-11-13 DIAGNOSIS — M545 Low back pain: Secondary | ICD-10-CM

## 2018-11-13 DIAGNOSIS — Z87891 Personal history of nicotine dependence: Secondary | ICD-10-CM

## 2018-11-13 DIAGNOSIS — E119 Type 2 diabetes mellitus without complications: Secondary | ICD-10-CM

## 2018-11-13 DIAGNOSIS — Z794 Long term (current) use of insulin: Secondary | ICD-10-CM

## 2018-11-13 DIAGNOSIS — Z79899 Other long term (current) drug therapy: Secondary | ICD-10-CM

## 2018-11-13 DIAGNOSIS — C9000 Multiple myeloma not having achieved remission: Secondary | ICD-10-CM

## 2018-11-13 DIAGNOSIS — Z9484 Stem cells transplant status: Secondary | ICD-10-CM

## 2018-11-13 LAB — CBC WITH DIFFERENTIAL/PLATELET
Abs Immature Granulocytes: 0.02 10*3/uL (ref 0.00–0.07)
Basophils Absolute: 0 10*3/uL (ref 0.0–0.1)
Basophils Relative: 1 %
Eosinophils Absolute: 0.2 10*3/uL (ref 0.0–0.5)
Eosinophils Relative: 7 %
HCT: 32.2 % — ABNORMAL LOW (ref 36.0–46.0)
Hemoglobin: 10.4 g/dL — ABNORMAL LOW (ref 12.0–15.0)
Immature Granulocytes: 1 %
Lymphocytes Relative: 26 %
Lymphs Abs: 0.8 10*3/uL (ref 0.7–4.0)
MCH: 30.3 pg (ref 26.0–34.0)
MCHC: 32.3 g/dL (ref 30.0–36.0)
MCV: 93.9 fL (ref 80.0–100.0)
Monocytes Absolute: 0.5 10*3/uL (ref 0.1–1.0)
Monocytes Relative: 16 %
Neutro Abs: 1.4 10*3/uL — ABNORMAL LOW (ref 1.7–7.7)
Neutrophils Relative %: 49 %
Platelets: 178 10*3/uL (ref 150–400)
RBC: 3.43 MIL/uL — ABNORMAL LOW (ref 3.87–5.11)
RDW: 15.7 % — ABNORMAL HIGH (ref 11.5–15.5)
WBC: 2.9 10*3/uL — ABNORMAL LOW (ref 4.0–10.5)
nRBC: 0 % (ref 0.0–0.2)

## 2018-11-13 LAB — COMPREHENSIVE METABOLIC PANEL
ALT: 21 U/L (ref 0–44)
AST: 27 U/L (ref 15–41)
Albumin: 3.7 g/dL (ref 3.5–5.0)
Alkaline Phosphatase: 70 U/L (ref 38–126)
Anion gap: 12 (ref 5–15)
BUN: 17 mg/dL (ref 6–20)
CO2: 23 mmol/L (ref 22–32)
Calcium: 8.8 mg/dL — ABNORMAL LOW (ref 8.9–10.3)
Chloride: 105 mmol/L (ref 98–111)
Creatinine, Ser: 1.09 mg/dL — ABNORMAL HIGH (ref 0.44–1.00)
GFR calc Af Amer: >60 mL/min (ref >60)
GFR calc non Af Amer: 58 mL/min — ABNORMAL LOW (ref >60)
Glucose, Bld: 140 mg/dL — ABNORMAL HIGH (ref 70–99)
Potassium: 3.8 mmol/L (ref 3.5–5.1)
Sodium: 140 mmol/L (ref 135–145)
Total Bilirubin: 0.8 mg/dL (ref 0.3–1.2)
Total Protein: 6.8 g/dL (ref 6.5–8.1)

## 2018-11-13 LAB — MAGNESIUM: Magnesium: 1.3 mg/dL — ABNORMAL LOW (ref 1.7–2.4)

## 2018-11-13 MED ORDER — MAGNESIUM SULFATE 2 GM/50ML IV SOLN
2.0000 g | Freq: Once | INTRAVENOUS | Status: AC
  Administered 2018-11-13: 2 g via INTRAVENOUS
  Filled 2018-11-13: qty 50

## 2018-11-13 MED ORDER — BORTEZOMIB CHEMO SQ INJECTION 3.5 MG (2.5MG/ML)
1.3000 mg/m2 | Freq: Once | INTRAMUSCULAR | Status: AC
  Administered 2018-11-13: 3 mg via SUBCUTANEOUS
  Filled 2018-11-13: qty 1.2

## 2018-11-13 MED ORDER — SODIUM CHLORIDE 0.9 % IV SOLN
INTRAVENOUS | Status: DC
  Administered 2018-11-13: 11:00:00 via INTRAVENOUS

## 2018-11-13 MED ORDER — PROCHLORPERAZINE MALEATE 10 MG PO TABS
10.0000 mg | ORAL_TABLET | Freq: Once | ORAL | Status: DC
  Filled 2018-11-13: qty 1

## 2018-11-13 NOTE — Patient Instructions (Addendum)
[facility] at [facility] Discharge Instructions  You were seen today by Dr. Katragadda. He went over your recent lab results. He will see you back in 4 weeks for labs and follow up.   Thank you for choosing [facility] at [facility] to provide your oncology and hematology care.  To afford each patient quality time with our provider, please arrive at least 15 minutes before your scheduled appointment time.   If you have a lab appointment with the Cancer Center please come in thru the  Main Entrance and check in at the main information desk  You need to re-schedule your appointment should you arrive 10 or more minutes late.  We strive to give you quality time with our providers, and arriving late affects you and other patients whose appointments are after yours.  Also, if you no show three or more times for appointments you may be dismissed from the clinic at the providers discretion.     Again, thank you for choosing [facility].  Our hope is that these requests will decrease the amount of time that you wait before being seen by our physicians.       _____________________________________________________________  Should you have questions after your visit to [facility], please contact our office at [phone] between the hours of 8:00 a.m. and 4:30 p.m.  Voicemails left after 4:00 p.m. will not be returned until the following business day.  For prescription refill requests, have your pharmacy contact our office and allow 72 hours.    Cancer Center Support Programs:   > Cancer Support Group  2nd Tuesday of the month 1pm-2pm, Journey Room    

## 2018-11-13 NOTE — Assessment & Plan Note (Addendum)
1.  IgG lambda multiple myeloma, stage III, del 17p: - Presentation with lower back pain since March, presented to the emergency room on 10/25/2017 with right groin pain, CT scan showed multiple lytic lesions, multiple lytic lesions on skeletal survey - SPEP shows 1.5 g/dL of monoclonal protein, beta-2 microglobulin of 4.8, free lambda light chains of 2869, kappa by lambda ratio of less than 0, elevated LDH. 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  FISH panel shows -14, -12, 13 q.-/-13 and 17p-.  Bone marrow biopsy shows 90% plasma cells. -4 cycles of RVD from 11/01/2017 through 01/14/2018.  Labs done on 01/20/2018 showed M spike to be negative. - She was evaluated by Dr. Laverta Baltimore at [facility].  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 - Because of her high risk disease, consolidation with 2 cycles of chemotherapy was done with RVD from 06/11/2018 through 07/01/2018.  -Maintenance Velcade every 2 weeks and Revlimid 10 mg 3 weeks on 1 week off started on 07/29/2018.   - She was hospitalized with mild fever and respiratory symptoms including wheezing from 08/09/2018 through 08/11/2018.  A nasal swab apparently was positive for RSV.  - Myeloma panel at [facility] on 08/18/2018 was negative. -She will continue Revlimid 10 mg 3 weeks on 1 week off.  She finished last cycle a few days ago. -She is not experiencing any side effects from it.  We reviewed her blood work today.  White count is 2.9.  Likely Revlimid related. - Myeloma panel from today is pending.  We will follow her in 4 weeks.  She will continue Velcade injection today.  She is not experiencing any worsening of neuropathy. -She is reports occasional back pain which started in the last few weeks.  2.  Bone strengthening agents: -We will continue Zometa every 3 months.  She will also continue calcium and vitamin D.  3.  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 -We will continue Lyrica 200 mg twice daily.  4.  ID prophylaxis: -She will continue acyclovir twice daily.  5.  Hypomagnesemia: -Magnesium is 1.3 today.  She will receive 2 g of magnesium. -I will increase magnesium to 2 tablets 3 times a day.  6.  Diabetes: - She is continuing metformin thousand milligrams twice daily. -She was recently started on Trulicity and Lantus 7 units at bedtime.

## 2018-11-13 NOTE — Progress Notes (Signed)
11/13/18  Ok to treat with ANC 1.4  V.O. Dr Beckey Downing, LPN/Robbie Rideaux Ronnald Ramp, PharmD

## 2018-11-13 NOTE — Telephone Encounter (Signed)
Faxed RX REPLACEMENT enrollment forms for Velcade and Zometa.

## 2018-11-13 NOTE — Progress Notes (Signed)
Lab work reviewed and patient seen by Dr. Delton Coombes. Per MD, ok to proceed with Velcade injection today. Verbal order also received to infuse 2g Magnesium.   Joanna Reid tolerated Velcade and Magnesium without incident or complaint. Discharged self ambulatory in satisfactory condition.

## 2018-11-13 NOTE — Progress Notes (Signed)
Joanna Reid, [place] [address]   CLINIC:  Medical Oncology/Hematology  PCP:  Mikey Kirschner, MD [address] [place] [address] [phone]   REASON FOR VISIT:  Follow-up for  multiple myeloma  CURRENT THERAPY:Maintenance Velcade and Revlimid   BRIEF ONCOLOGIC HISTORY:    Multiple myeloma ([place])   10/29/2017 Initial Diagnosis    Multiple myeloma ([place])    11/01/2017 - 01/24/2018 Chemotherapy    The patient had bortezomib SQ (VELCADE) chemo injection 3 mg, 1.3 mg/m2 = 3 mg, Subcutaneous,  Once, 5 of 5 cycles Administration: 3 mg (11/01/2017), 3 mg (11/08/2017), 3 mg (11/05/2017), 3 mg (11/22/2017), 3 mg (11/12/2017), 3 mg (11/29/2017), 3 mg (11/26/2017), 3 mg (12/03/2017), 3 mg (12/13/2017), 3 mg (12/20/2017), 3 mg (12/17/2017), 3 mg (12/24/2017), 3 mg (01/03/2018), 3 mg (01/10/2018), 3 mg (01/07/2018), 3 mg (01/14/2018), 3 mg (01/24/2018)  for chemotherapy treatment.     06/11/2018 -  Chemotherapy    The patient had bortezomib SQ (VELCADE) chemo injection 3 mg, 1.3 mg/m2 = 3 mg, Subcutaneous,  Once, 14 of 15 cycles Administration: 3 mg (06/11/2018), 3 mg (07/29/2018), 3 mg (08/21/2018), 3 mg (09/04/2018), 3 mg (09/18/2018), 3 mg (10/02/2018), 3 mg (10/16/2018), 3 mg (10/29/2018), 3 mg (06/18/2018), 3 mg (06/25/2018), 3 mg (07/01/2018), 3 mg (07/08/2018), 3 mg (07/15/2018)  for chemotherapy treatment.       CANCER STAGING: Cancer Staging Multiple myeloma ([place]) Staging form: Plasma Cell Myeloma and Plasma Cell Disorders, AJCC 8th Edition - Clinical: No stage assigned - Unsigned    INTERVAL HISTORY:  Joanna Reid 53 y.o. female returns for routine follow-up. She is here today alone. She states that she has been doing well since her last visit. She states that she has noticed worsened lower back pain. Denies any nausea, vomiting, or diarrhea. Denies any new pains. Had not noticed any recent bleeding such as epistaxis, hematuria or hematochezia. Denies recent  chest pain on exertion, shortness of breath on minimal exertion, pre-syncopal episodes, or palpitations. Denies any numbness or tingling in hands or feet. Denies any recent fevers, infections, or recent hospitalizations. Patient reports appetite at 100% and energy level at 75%.    REVIEW OF SYSTEMS:  Review of Systems  Respiratory: Positive for shortness of breath.   All other systems reviewed and are negative.    PAST MEDICAL/SURGICAL HISTORY:  Past Medical History:  Diagnosis Date   Acid reflux    Allergic rhinitis    Cancer (HCC)    multiple myeloma   Diabetes mellitus    type 2   Gout    Gout    HBP (high blood pressure)    History of kidney stones    Migraines    Past Surgical History:  Procedure Laterality Date   CESAREAN SECTION     EXTRACORPOREAL SHOCK WAVE LITHOTRIPSY Left 10/10/2017   Procedure: LEFT EXTRACORPOREAL SHOCK WAVE LITHOTRIPSY (ESWL);  Surgeon: Bjorn Loser, MD;  Location: [facility];  Service: Urology;  Laterality: Left;   EYE SURGERY     HEMORRHOID SURGERY N/A 11/19/2012   Procedure: HEMORRHOIDECTOMY;  Surgeon: Jamesetta So, MD;  Location: [facility];  Service: General;  Laterality: N/A;   kidney stones  1998   LAPAROSCOPIC UNILATERAL SALPINGO OOPHERECTOMY  05/14/2012   Procedure: LAPAROSCOPIC UNILATERAL SALPINGO OOPHORECTOMY;  Surgeon: [name], MD;  Location: [facility];  Service: Gynecology;  Laterality: Right;  laparoscopic right salpingo-oophorectomy   PARTIAL HYSTERECTOMY     TONSILECTOMY,  ADENOIDECTOMY, BILATERAL MYRINGOTOMY AND TUBES     VESICOVAGINAL FISTULA CLOSURE W/ TAH       SOCIAL HISTORY:  Social History   Socioeconomic History   Marital status: Married    Spouse name: Not on file   Number of children: Not on file   Years of education: 12   Highest education level: Not on file  Occupational History    Employer: [employer]  Social Needs   Financial resource strain: Not on file   Food insecurity:    Worry:  Never true    Inability: Never true   Transportation needs:    Medical: Not on file    Non-medical: Not on file  Tobacco Use   Smoking status: Former Smoker    Last attempt to quit: 02/27/1999    Years since quitting: 19.7   Smokeless tobacco: Never Used   Tobacco comment: socially   Substance and Sexual Activity   Alcohol use: No    Alcohol/week: 0.0 standard drinks   Drug use: No   Sexual activity: Yes  Lifestyle   Physical activity:    Days per week: Not on file    Minutes per session: Not on file   Stress: Not on file  Relationships   Social connections:    Talks on phone: Not on file    Gets together: Not on file    Attends religious service: Not on file    Active member of club or organization: Not on file    Attends meetings of clubs or organizations: Not on file    Relationship status: Not on file   Intimate partner violence:    Fear of current or ex partner: Not on file    Emotionally abused: Not on file    Physically abused: Not on file    Forced sexual activity: Not on file  Other Topics Concern   Not on file  Social History Narrative   Not on file    FAMILY HISTORY:  Family History  Problem Relation Age of Onset   Arthritis Other    Cancer Other    Diabetes Other    Hypertension Mother    Dementia Mother    Diabetes Father    ALS Father    Diabetes Brother    Hypertension Brother    Cancer Paternal Aunt    COPD Maternal Grandmother    Cancer Maternal Grandfather    Anesthesia problems Paternal Grandfather     CURRENT MEDICATIONS:  Outpatient Encounter Medications as of 11/13/2018  Medication Sig Note   acyclovir (ZOVIRAX) 800 MG tablet Take 800 mg by mouth 2 (two) times daily.     albuterol (PROVENTIL HFA;VENTOLIN HFA) 108 (90 Base) MCG/ACT inhaler Inhale 2 puffs into the lungs every 4 (four) hours as needed for wheezing or shortness of breath.    allopurinol (ZYLOPRIM) 300 MG tablet TAKE 1 TABLET BY MOUTH  DAILY FOR  GOUT    aspirin EC 81 MG tablet Take 81 mg by mouth daily.    Bortezomib (VELCADE IJ) Inject as directed every 14 (fourteen) days.  08/09/2018: Due for next injection on 08/13/2018   bumetanide (BUMEX) 1 MG tablet Take 1 tablet (1 mg total) by mouth every morning.    carvedilol (COREG) 6.25 MG tablet Take 6.25 mg by mouth 2 (two) times daily with a meal.     diazepam (VALIUM) 5 MG tablet Take 1 tablet (5 mg total) by mouth at bedtime.    docusate sodium (COLACE) 100 MG capsule  Take 100 mg by mouth daily.     fluticasone (FLONASE) 50 MCG/ACT nasal spray Place 2 sprays into the nose 2 (two) times daily.    glucose blood (PRECISION QID TEST) test strip Use 1 each (1 strip total) 4 (four) times daily Use as instructed. OneTouch Verio    HYDROcodone-acetaminophen (NORCO/VICODIN) 5-325 MG tablet Take 1 tablet by mouth at bedtime as needed for moderate pain.    insulin glargine (LANTUS) 100 UNIT/ML injection Inject 7 Units into the skin at bedtime.    LANTUS SOLOSTAR 100 UNIT/ML Solostar Pen     lenalidomide (REVLIMID) 10 MG capsule TAKE 1 CAPSULE BY MOUTH  DAILY FOR 21 DAYS ON, THEN  7 DAYS OFF    losartan (COZAAR) 100 MG tablet TAKE 1 TABLET BY MOUTH  EVERY MORNING    magnesium oxide (MAG-OX) 400 (241.3 Mg) MG tablet Take 1 tablet (400 mg total) by mouth 3 (three) times daily.    metFORMIN (GLUCOPHAGE) 500 MG tablet TAKE 1 TABLET BY MOUTH ONCE DAILY WITH BREAKFAST (Patient taking differently: Take 1,000 mg by mouth 2 (two) times daily with a meal. )    omeprazole (PRILOSEC) 20 MG capsule TAKE 1 CAPSULE BY MOUTH  DAILY    ondansetron (ZOFRAN) 8 MG tablet Take 1 tablet (8 mg total) by mouth every 8 (eight) hours as needed.    potassium chloride SA (K-DUR,KLOR-CON) 20 MEQ tablet Take 20 mEq by mouth daily.     pregabalin (LYRICA) 200 MG capsule TAKE (1) CAPSULE BY MOUTH TWICE DAILY.    prochlorperazine (COMPAZINE) 10 MG tablet Take 1 tablet (10 mg total) by mouth every 6 (six) hours as  needed for nausea or vomiting. 08/09/2018: Takes prior to Velcade injection   rosuvastatin (CRESTOR) 5 MG tablet Take 5 mg by mouth daily.     TRULICITY 2.42 PN/3.6RW SOPN Inject 0.75 mg into the skin every Monday.     verapamil (CALAN-SR) 240 MG CR tablet TAKE 1 TABLET BY MOUTH AT  BEDTIME    No facility-administered encounter medications on file as of 11/13/2018.     ALLERGIES:  No Known Allergies   PHYSICAL EXAM:  ECOG Performance status: 1  Vitals:   11/13/18 0911  BP: 125/78  Pulse: 66  Resp: 16  Temp: 97.8 F (36.6 C)  SpO2: 98%   Filed Weights   11/13/18 0911  Weight: 267 lb 12.8 oz (121.5 kg)    Physical Exam Vitals signs reviewed.  Constitutional:      Appearance: Normal appearance.  Cardiovascular:     Rate and Rhythm: Normal rate and regular rhythm.     Heart sounds: Normal heart sounds.  Pulmonary:     Breath sounds: Normal breath sounds.  Abdominal:     General: There is no distension.     Palpations: Abdomen is soft. There is no mass.  Musculoskeletal:        General: No swelling.  Skin:    General: Skin is warm.  Neurological:     General: No focal deficit present.     Mental Status: She is alert and oriented to person, place, and time.  Psychiatric:        Mood and Affect: Mood normal.        Behavior: Behavior normal.      LABORATORY DATA:  I have reviewed the labs as listed.  CBC    Component Value Date/Time   WBC 2.9 (L) 11/13/2018 0840   RBC 3.43 (L) 11/13/2018 0840   HGB  10.4 (L) 11/13/2018 0840   HGB 14.2 08/02/2015 1113   HCT 32.2 (L) 11/13/2018 0840   HCT 41.1 08/02/2015 1113   PLT 178 11/13/2018 0840   PLT 371 08/02/2015 1113   MCV 93.9 11/13/2018 0840   MCV 84 08/02/2015 1113   MCH 30.3 11/13/2018 0840   MCHC 32.3 11/13/2018 0840   RDW 15.7 (H) 11/13/2018 0840   RDW 15.1 08/02/2015 1113   LYMPHSABS 0.8 11/13/2018 0840   LYMPHSABS 2.9 08/02/2015 1113   MONOABS 0.5 11/13/2018 0840   EOSABS 0.2 11/13/2018 0840    EOSABS 0.2 08/02/2015 1113   BASOSABS 0.0 11/13/2018 0840   BASOSABS 0.1 08/02/2015 1113   CMP Latest Ref Rng & Units 11/13/2018 10/29/2018 10/16/2018  Glucose 70 - 99 mg/dL 140(H) 148(H) 184(H)  BUN 6 - 20 mg/dL 17 22(H) 18  Creatinine 0.44 - 1.00 mg/dL 1.09(H) 1.05(H) 1.01(H)  Sodium 135 - 145 mmol/L 140 141 138  Potassium 3.5 - 5.1 mmol/L 3.8 3.8 3.4(L)  Chloride 98 - 111 mmol/L 105 105 103  CO2 22 - 32 mmol/L _0 Calcium 8.9 - 10.3 mg/dL 8.8(L) 9.4 8.3(L)  Total Protein 6.5 - 8.1 g/dL 6.8 7.4 6.6  Total Bilirubin 0.3 - 1.2 mg/dL 0.8 0.6 0.7  Alkaline Phos 38 - 126 U/L 70 72 76  AST 15 - 41 U/L 27 38 32  ALT 0 - 44 U/L _1 DIAGNOSTIC IMAGING:  I have independently reviewed the scans and discussed with the patient.   I have reviewed Venita Lick LPN's note and agree with the documentation.  I personally performed a face-to-face visit, made revisions and my assessment and plan is as follows.    ASSESSMENT & PLAN:   Multiple myeloma ([place]) 1.  IgG lambda multiple myeloma, stage III, del 17p: - Presentation with lower back pain since March, presented to the emergency room on 10/25/2017 with right groin pain, CT scan showed multiple lytic lesions, multiple lytic lesions on skeletal survey - SPEP shows 1.5 g/dL of monoclonal protein, beta-2 microglobulin of 4.8, free lambda light chains of 2869, kappa by lambda ratio of less than 0, elevated LDH. 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  FISH panel shows -14, -12, 13 q.-/-13 and 17p-.  Bone marrow biopsy shows 90% plasma cells. -4 cycles of RVD from 11/01/2017 through 01/14/2018.  Labs done on 01/20/2018 showed M spike to be negative. - She was evaluated by Dr. Laverta Baltimore at [facility].  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 - Because of her high risk disease, consolidation with 2 cycles of chemotherapy was done with RVD from 06/11/2018  through 07/01/2018.  -Maintenance Velcade every 2 weeks and Revlimid 10 mg 3 weeks on 1 week off started on 07/29/2018.   - She was hospitalized with mild fever and respiratory symptoms including wheezing from 08/09/2018 through 08/11/2018.  A nasal swab apparently was positive for RSV.  - Myeloma panel at [facility] on 08/18/2018 was negative. -She will continue Revlimid 10 mg 3 weeks on 1 week off.  She finished last cycle a few days ago. -She is not experiencing any side effects from it.  We reviewed her blood work today.  White count is 2.9.  Likely Revlimid related. - Myeloma panel from today is pending.  We will follow her in 4 weeks.  She will continue Velcade injection today.  She is  not experiencing any worsening of neuropathy. -She is reports occasional back pain which started in the last few weeks.  2.  Bone strengthening agents: -We will continue Zometa every 3 months.  She will also continue calcium and vitamin D.  3.  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 -We will continue Lyrica 200 mg twice daily.  4.  ID prophylaxis: -She will continue acyclovir twice daily.  5.  Hypomagnesemia: -Magnesium is 1.3 today.  She will receive 2 g of magnesium. -I will increase magnesium to 2 tablets 3 times a day.  6.  Diabetes: - She is continuing metformin thousand milligrams twice daily. -She was recently started on Trulicity and Lantus 7 units at bedtime.      Orders placed this encounter:  No orders of the defined types were placed in this encounter.     Derek Jack, MD [place] [phone]

## 2018-11-13 NOTE — Patient Instructions (Signed)
[facility] Discharge Instructions for Patients Receiving Chemotherapy   Beginning January 23rd 2017 lab work for the Cancer Center will be done in the  Main lab at [place] on 1st floor. If you have a lab appointment with the Cancer Center please come in thru the  Main Entrance and check in at the main information desk   Today you received the following chemotherapy agents Velcade  To help prevent nausea and vomiting after your treatment, we encourage you to take your nausea medication    If you develop nausea and vomiting, or diarrhea that is not controlled by your medication, call the clinic.  The clinic phone number is [phone]. Office hours are Monday-Friday 8:30am-5:00pm.  BELOW ARE SYMPTOMS THAT SHOULD BE REPORTED IMMEDIATELY:  *FEVER GREATER THAN 101.0 F  *CHILLS WITH OR WITHOUT FEVER  NAUSEA AND VOMITING THAT IS NOT CONTROLLED WITH YOUR NAUSEA MEDICATION  *UNUSUAL SHORTNESS OF BREATH  *UNUSUAL BRUISING OR BLEEDING  TENDERNESS IN MOUTH AND THROAT WITH OR WITHOUT PRESENCE OF ULCERS  *URINARY PROBLEMS  *BOWEL PROBLEMS  UNUSUAL RASH Items with * indicate a potential emergency and should be followed up as soon as possible. If you have an emergency after office hours please contact your primary care physician or go to the nearest emergency department.  Please call the clinic during office hours if you have any questions or concerns.   You may also contact the Patient Navigator at [phone] should you have any questions or need assistance in obtaining follow up care.      Resources For Cancer Patients and their Caregivers ? American Cancer Society: Can assist with transportation, wigs, general needs, runs Look Good Feel Better.        1-888-227-6333 ? Cancer Care: Provides financial assistance, online support groups, medication/co-pay assistance.  1-800-813-HOPE (4673) ? [facility] Assists [place] cancer  patients and their families through emotional , educational and financial support.  336-427-4357 ? [place] DSS Where to apply for food stamps, Medicaid and utility assistance. 336-342-1394 ? [facility]: Transportation to medical appointments. 336-347-2287 ? Social Security Administration: May apply for disability if have a Stage IV cancer. 336-342-7796 1-800-772-1213 ? [place] Aging, Disability and Transit Services: Assists with nutrition, care and transit needs. 336-349-2343          

## 2018-11-14 LAB — IMMUNOFIXATION ELECTROPHORESIS
IgA: 139 mg/dL (ref 87–352)
IgG (Immunoglobin G), Serum: 961 mg/dL (ref 586–1602)
IgM (Immunoglobulin M), Srm: 17 mg/dL — ABNORMAL LOW (ref 26–217)
Total Protein ELP: 6.5 g/dL (ref 6.0–8.5)

## 2018-11-14 LAB — PROTEIN ELECTROPHORESIS, SERUM
A/G Ratio: 1.2 (ref 0.7–1.7)
Albumin ELP: 3.3 g/dL (ref 2.9–4.4)
Alpha-1-Globulin: 0.2 g/dL (ref 0.0–0.4)
Alpha-2-Globulin: 0.8 g/dL (ref 0.4–1.0)
Beta Globulin: 0.9 g/dL (ref 0.7–1.3)
Gamma Globulin: 0.8 g/dL (ref 0.4–1.8)
Globulin, Total: 2.8 g/dL (ref 2.2–3.9)
Total Protein ELP: 6.1 g/dL (ref 6.0–8.5)

## 2018-11-14 LAB — KAPPA/LAMBDA LIGHT CHAINS
Kappa free light chain: 55.5 mg/L — ABNORMAL HIGH (ref 3.3–19.4)
Kappa, lambda light chain ratio: 1.98 — ABNORMAL HIGH (ref 0.26–1.65)
Lambda free light chains: 28.1 mg/L — ABNORMAL HIGH (ref 5.7–26.3)

## 2018-11-14 IMAGING — CT CT ABD-PELV W/ CM
2 of 5 series · 16 of 46 positions shown, 18 images · IV contrast (Isovue)
Comparison: Right upper quadrant ultrasound from yesterday. CT
abdomen pelvis dated January 02, 2017.

CLINICAL DATA: Right lower quadrant abdominal pain for the past
week.

EXAM:
CT ABDOMEN AND PELVIS WITH CONTRAST
TECHNIQUE: Multidetector CT imaging of the abdomen and pelvis was performed
using the standard protocol following bolus administration of
intravenous contrast.
CONTRAST:  100mL [id number] IOPAMIDOL ([id number]) INJECTION 61%

[Series 2: axial st · axial · 0.90mm/px · z∈[+914,+1339]mm · 13 of 95 slices shown, 15 images]
[im 5/95  soft-tissue]
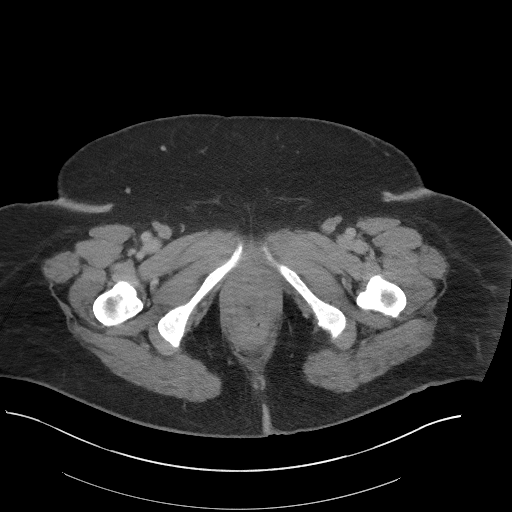
[im 5/95  bone]
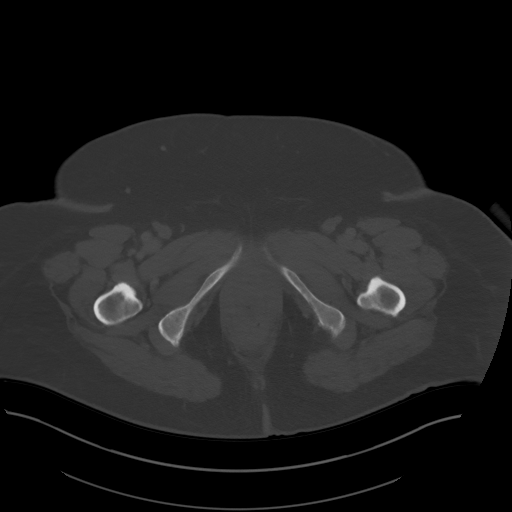
[im 15/95  soft-tissue]
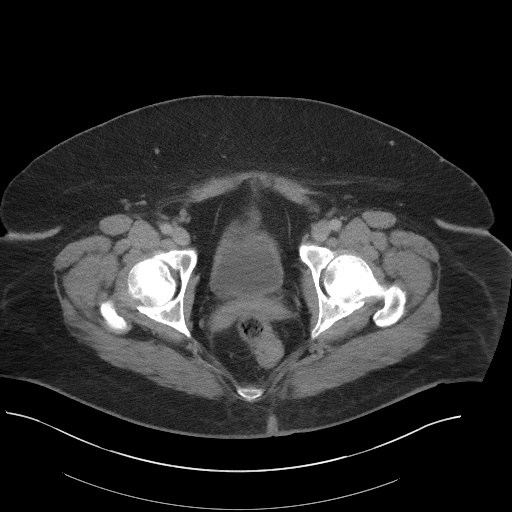
[im 19/95  soft-tissue]
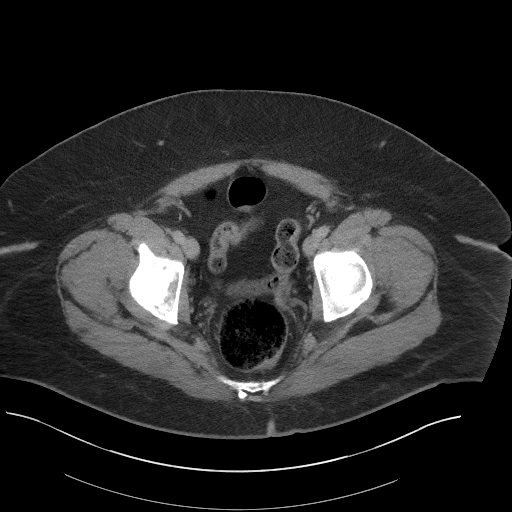
[im 29/95  soft-tissue]
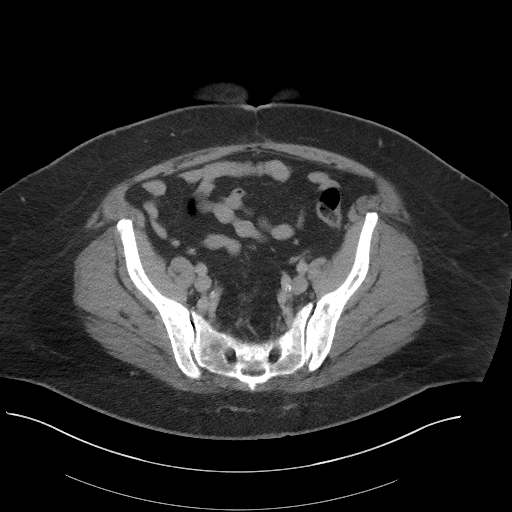
[im 33/95  soft-tissue]
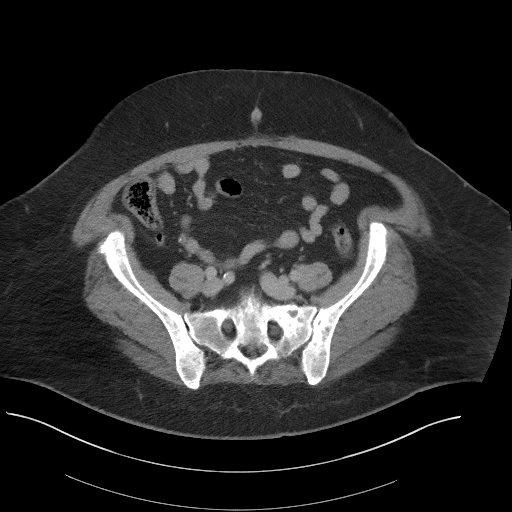
[im 43/95  soft-tissue]
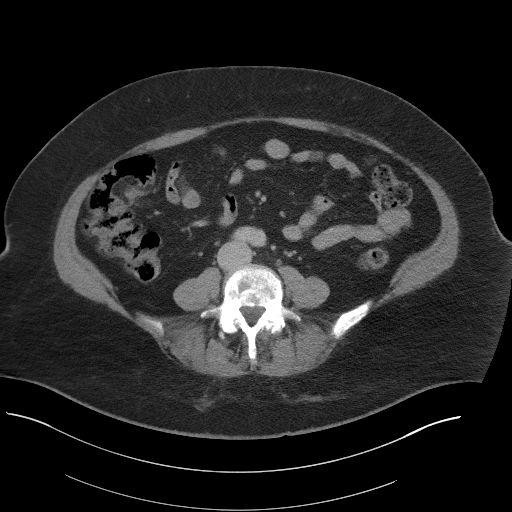
[im 48/95  soft-tissue]
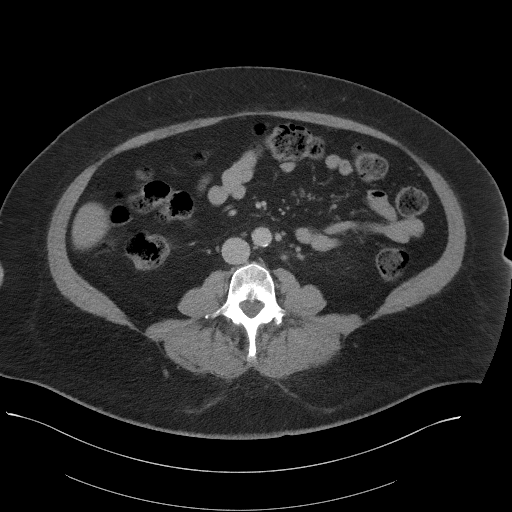
[im 52/95  soft-tissue]
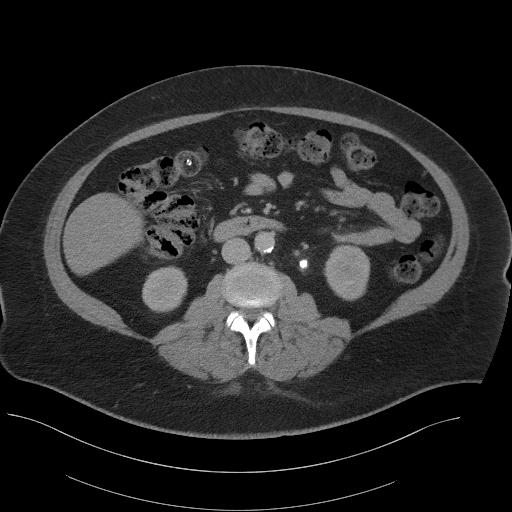
[im 62/95  soft-tissue]
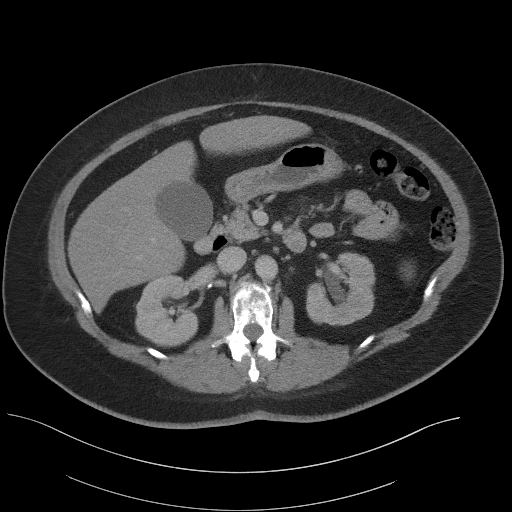
[im 62/95  bone]
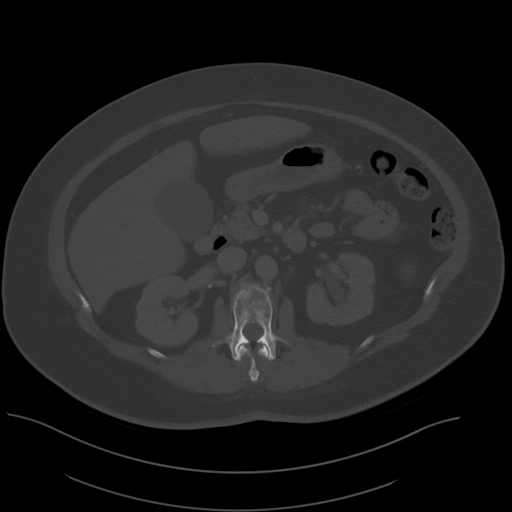
[im 66/95  soft-tissue]
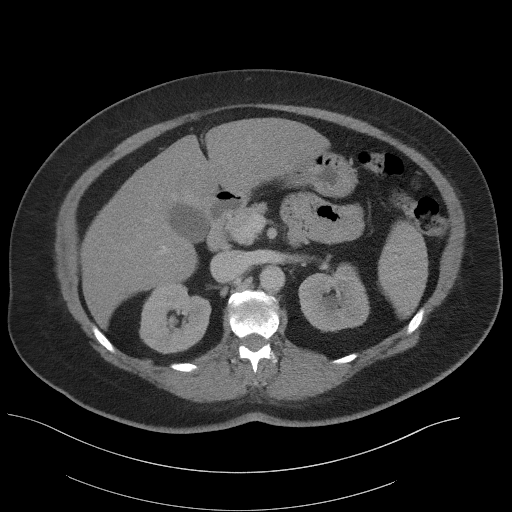
[im 76/95  soft-tissue]
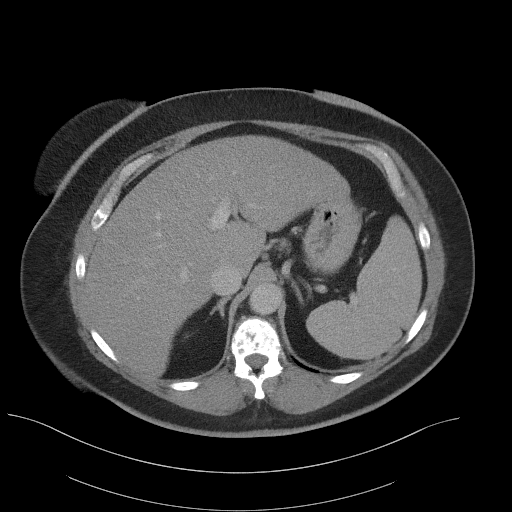
[im 80/95  soft-tissue]
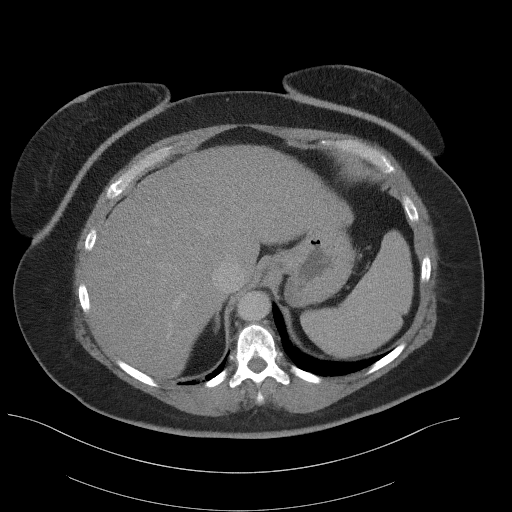
[im 90/95  soft-tissue]
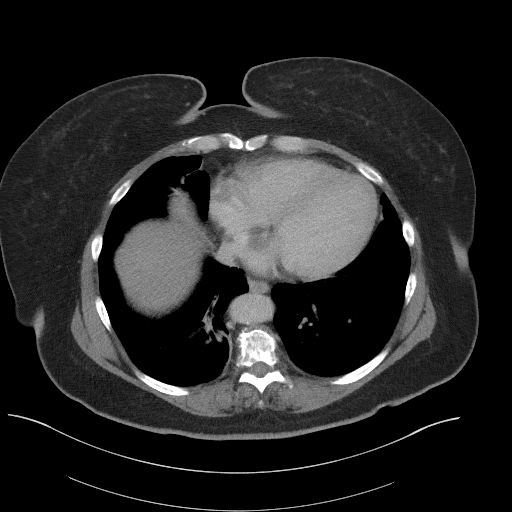

[Series 5: coronal st · coronal · 0.83mm/px · 3 of 116 slices shown]
[im 39/116  soft-tissue]
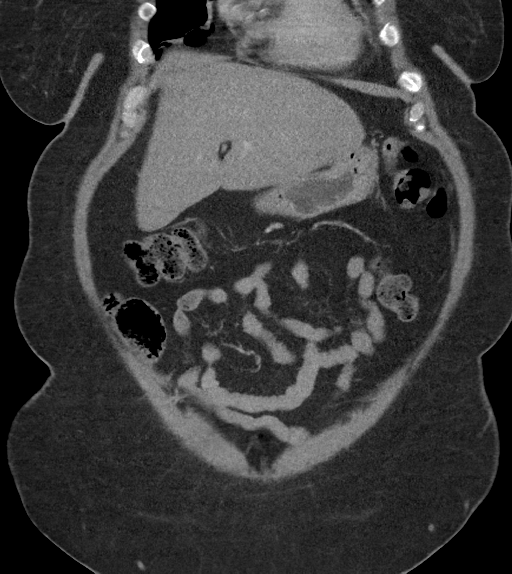
[im 52/116  soft-tissue]
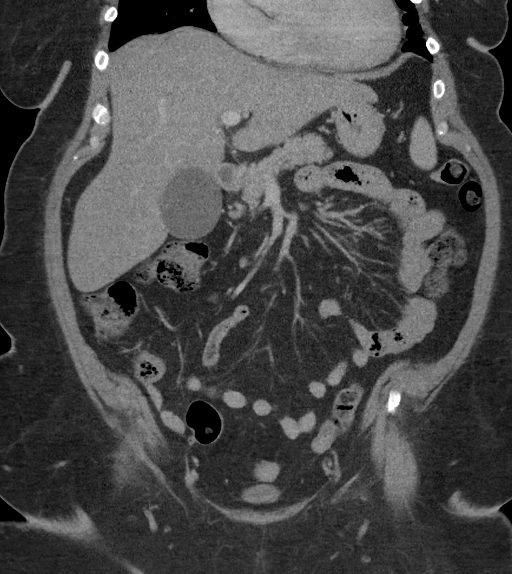
[im 64/116  soft-tissue]
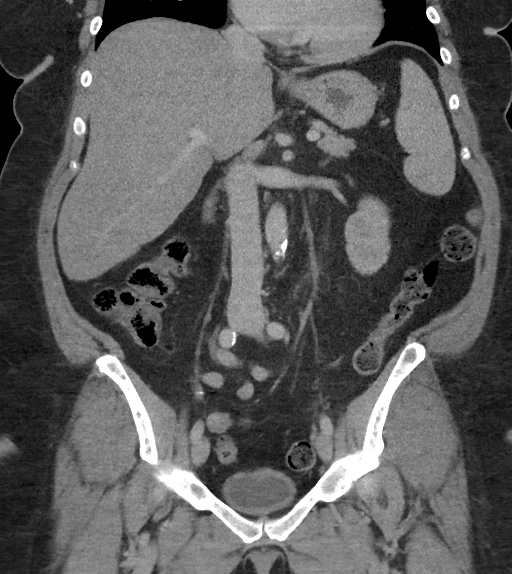

[16 of 46 positions shown; findings below may reference images not displayed]

FINDINGS: Lower chest: No acute abnormality.  Bibasilar atelectasis/scarring.

Hepatobiliary: Diffuse hepatic steatosis. No focal liver
abnormality. The gallbladder is unremarkable. No biliary dilatation.

Pancreas: Unremarkable. No pancreatic ductal dilatation or
surrounding inflammatory changes.

Spleen: Normal in size without focal abnormality.

Adrenals/Urinary Tract: The adrenal glands are unremarkable. 13 mm
calculus in the proximal left ureter with resultant mild left
hydronephrosis. Additional nonobstructive left-sided calculi
measuring up to 11 mm. Small nonobstructive right renal calculi
measuring up to 6 mm. No right hydronephrosis. The bladder is
unremarkable.

Stomach/Bowel: Stomach is within normal limits. Appendix appears
normal. No evidence of bowel wall thickening, distention, or
inflammatory changes.

Vascular/Lymphatic: Aortic atherosclerosis. No enlarged abdominal or
pelvic lymph nodes.

Reproductive: The uterus and right ovary are surgically absent.
Normal appearance of the left ovary.

Other: Unchanged small fat containing umbilical hernia. No free
fluid or pneumoperitoneum.

Musculoskeletal: New innumerable small lytic lesions throughout the
visualized thoracolumbar spine, ribs, and pelvis. New mild superior
endplate compression fracture of T12.
IMPRESSION: 1. 13 mm calculus in the proximal left ureter with resultant mild
left hydronephrosis.
2. New innumerable small lytic lesions throughout the visualized
axial skeleton, concerning for disseminated multiple myeloma.
3. New mild superior endplate compression fracture of T12.
4. Diffuse hepatic steatosis.
5.  Aortic atherosclerosis ([id number].C).

## 2018-11-19 ENCOUNTER — Other Ambulatory Visit (HOSPITAL_COMMUNITY): Payer: Self-pay | Admitting: Hematology

## 2018-11-19 DIAGNOSIS — C9000 Multiple myeloma not having achieved remission: Secondary | ICD-10-CM

## 2018-11-27 ENCOUNTER — Inpatient Hospital Stay (HOSPITAL_COMMUNITY): Payer: BC Managed Care – PPO

## 2018-11-27 ENCOUNTER — Other Ambulatory Visit: Payer: Self-pay

## 2018-11-27 VITALS — BP 128/82 | HR 76 | Temp 98.2°F | Resp 18 | Wt 269.6 lb

## 2018-11-27 DIAGNOSIS — C9000 Multiple myeloma not having achieved remission: Secondary | ICD-10-CM

## 2018-11-27 DIAGNOSIS — Z5112 Encounter for antineoplastic immunotherapy: Secondary | ICD-10-CM | POA: Diagnosis not present

## 2018-11-27 LAB — COMPREHENSIVE METABOLIC PANEL
ALT: 21 U/L (ref 0–44)
AST: 34 U/L (ref 15–41)
Albumin: 4 g/dL (ref 3.5–5.0)
Alkaline Phosphatase: 73 U/L (ref 38–126)
Anion gap: 14 (ref 5–15)
BUN: 18 mg/dL (ref 6–20)
CO2: 24 mmol/L (ref 22–32)
Calcium: 9.5 mg/dL (ref 8.9–10.3)
Chloride: 104 mmol/L (ref 98–111)
Creatinine, Ser: 1.16 mg/dL — ABNORMAL HIGH (ref 0.44–1.00)
GFR calc Af Amer: >60 mL/min (ref >60)
GFR calc non Af Amer: 53 mL/min — ABNORMAL LOW (ref >60)
Glucose, Bld: 137 mg/dL — ABNORMAL HIGH (ref 70–99)
Potassium: 3.7 mmol/L (ref 3.5–5.1)
Sodium: 142 mmol/L (ref 135–145)
Total Bilirubin: 0.7 mg/dL (ref 0.3–1.2)
Total Protein: 7.4 g/dL (ref 6.5–8.1)

## 2018-11-27 LAB — CBC WITH DIFFERENTIAL/PLATELET
Abs Immature Granulocytes: 0.04 10*3/uL (ref 0.00–0.07)
Basophils Absolute: 0 10*3/uL (ref 0.0–0.1)
Basophils Relative: 1 %
Eosinophils Absolute: 0.1 10*3/uL (ref 0.0–0.5)
Eosinophils Relative: 2 %
HCT: 33.2 % — ABNORMAL LOW (ref 36.0–46.0)
Hemoglobin: 10.7 g/dL — ABNORMAL LOW (ref 12.0–15.0)
Immature Granulocytes: 1 %
Lymphocytes Relative: 24 %
Lymphs Abs: 0.8 10*3/uL (ref 0.7–4.0)
MCH: 30.2 pg (ref 26.0–34.0)
MCHC: 32.2 g/dL (ref 30.0–36.0)
MCV: 93.8 fL (ref 80.0–100.0)
Monocytes Absolute: 0.2 10*3/uL (ref 0.1–1.0)
Monocytes Relative: 5 %
Neutro Abs: 2.3 10*3/uL (ref 1.7–7.7)
Neutrophils Relative %: 67 %
Platelets: 232 10*3/uL (ref 150–400)
RBC: 3.54 MIL/uL — ABNORMAL LOW (ref 3.87–5.11)
RDW: 16.2 % — ABNORMAL HIGH (ref 11.5–15.5)
WBC: 3.5 10*3/uL — ABNORMAL LOW (ref 4.0–10.5)
nRBC: 0 % (ref 0.0–0.2)

## 2018-11-27 LAB — MAGNESIUM: Magnesium: 1.4 mg/dL — ABNORMAL LOW (ref 1.7–2.4)

## 2018-11-27 MED ORDER — MAGNESIUM SULFATE 2 GM/50ML IV SOLN
2.0000 g | Freq: Once | INTRAVENOUS | Status: AC
  Administered 2018-11-27: 2 g via INTRAVENOUS
  Filled 2018-11-27: qty 50

## 2018-11-27 MED ORDER — SODIUM CHLORIDE 0.9 % IV SOLN
Freq: Once | INTRAVENOUS | Status: AC
  Administered 2018-11-27: 11:00:00 via INTRAVENOUS

## 2018-11-27 MED ORDER — BORTEZOMIB CHEMO SQ INJECTION 3.5 MG (2.5MG/ML)
1.3000 mg/m2 | Freq: Once | INTRAMUSCULAR | Status: AC
  Administered 2018-11-27: 3 mg via SUBCUTANEOUS
  Filled 2018-11-27: qty 1.2

## 2018-11-27 MED ORDER — ZOLEDRONIC ACID 4 MG/100ML IV SOLN
4.0000 mg | Freq: Once | INTRAVENOUS | Status: AC
  Administered 2018-11-27: 4 mg via INTRAVENOUS
  Filled 2018-11-27: qty 100

## 2018-11-27 NOTE — Patient Instructions (Signed)
[facility] Discharge Instructions for Patients Receiving Chemotherapy  Today you received the following chemotherapy agents   To help prevent nausea and vomiting after your treatment, we encourage you to take your nausea medication   If you develop nausea and vomiting that is not controlled by your nausea medication, call the clinic.   BELOW ARE SYMPTOMS THAT SHOULD BE REPORTED IMMEDIATELY:  *FEVER GREATER THAN 100.5 F  *CHILLS WITH OR WITHOUT FEVER  NAUSEA AND VOMITING THAT IS NOT CONTROLLED WITH YOUR NAUSEA MEDICATION  *UNUSUAL SHORTNESS OF BREATH  *UNUSUAL BRUISING OR BLEEDING  TENDERNESS IN MOUTH AND THROAT WITH OR WITHOUT PRESENCE OF ULCERS  *URINARY PROBLEMS  *BOWEL PROBLEMS  UNUSUAL RASH Items with * indicate a potential emergency and should be followed up as soon as possible.  Feel free to call the clinic should you have any questions or concerns. The clinic phone number is [phone].  Please show the CHEMO ALERT CARD at check-in to the Emergency Department and triage nurse.   

## 2018-11-27 NOTE — Progress Notes (Signed)
Pt presents today for Velcade and Zometa. VSS. Pt has no complaints of any changes since the last visit. MAR reviewed.   Message received to administer 2 grams of Magnesium IV. Pt instructed and understanding verbalized.

## 2018-12-10 ENCOUNTER — Other Ambulatory Visit: Payer: Self-pay

## 2018-12-11 ENCOUNTER — Inpatient Hospital Stay (HOSPITAL_BASED_OUTPATIENT_CLINIC_OR_DEPARTMENT_OTHER): Payer: BC Managed Care – PPO | Admitting: Hematology

## 2018-12-11 ENCOUNTER — Other Ambulatory Visit: Payer: Self-pay

## 2018-12-11 ENCOUNTER — Inpatient Hospital Stay (HOSPITAL_COMMUNITY): Payer: BC Managed Care – PPO

## 2018-12-11 ENCOUNTER — Encounter (HOSPITAL_COMMUNITY): Payer: Self-pay | Admitting: Hematology

## 2018-12-11 ENCOUNTER — Inpatient Hospital Stay (HOSPITAL_COMMUNITY): Payer: BC Managed Care – PPO | Attending: Hematology

## 2018-12-11 VITALS — BP 154/61 | HR 66 | Temp 97.7°F | Resp 16 | Wt 269.2 lb

## 2018-12-11 DIAGNOSIS — G620 Drug-induced polyneuropathy: Secondary | ICD-10-CM

## 2018-12-11 DIAGNOSIS — E876 Hypokalemia: Secondary | ICD-10-CM

## 2018-12-11 DIAGNOSIS — Z5112 Encounter for antineoplastic immunotherapy: Secondary | ICD-10-CM | POA: Diagnosis present

## 2018-12-11 DIAGNOSIS — E119 Type 2 diabetes mellitus without complications: Secondary | ICD-10-CM

## 2018-12-11 DIAGNOSIS — Z87891 Personal history of nicotine dependence: Secondary | ICD-10-CM

## 2018-12-11 DIAGNOSIS — Z809 Family history of malignant neoplasm, unspecified: Secondary | ICD-10-CM

## 2018-12-11 DIAGNOSIS — C9000 Multiple myeloma not having achieved remission: Secondary | ICD-10-CM

## 2018-12-11 DIAGNOSIS — Z7984 Long term (current) use of oral hypoglycemic drugs: Secondary | ICD-10-CM

## 2018-12-11 LAB — CBC WITH DIFFERENTIAL/PLATELET
Abs Immature Granulocytes: 0.02 10*3/uL (ref 0.00–0.07)
Basophils Absolute: 0 10*3/uL (ref 0.0–0.1)
Basophils Relative: 1 %
Eosinophils Absolute: 0.2 10*3/uL (ref 0.0–0.5)
Eosinophils Relative: 7 %
HCT: 29.5 % — ABNORMAL LOW (ref 36.0–46.0)
Hemoglobin: 9.8 g/dL — ABNORMAL LOW (ref 12.0–15.0)
Immature Granulocytes: 1 %
Lymphocytes Relative: 25 %
Lymphs Abs: 0.7 10*3/uL (ref 0.7–4.0)
MCH: 30.8 pg (ref 26.0–34.0)
MCHC: 33.2 g/dL (ref 30.0–36.0)
MCV: 92.8 fL (ref 80.0–100.0)
Monocytes Absolute: 0.3 10*3/uL (ref 0.1–1.0)
Monocytes Relative: 11 %
Neutro Abs: 1.6 10*3/uL — ABNORMAL LOW (ref 1.7–7.7)
Neutrophils Relative %: 55 %
Platelets: 186 10*3/uL (ref 150–400)
RBC: 3.18 MIL/uL — ABNORMAL LOW (ref 3.87–5.11)
RDW: 16.6 % — ABNORMAL HIGH (ref 11.5–15.5)
WBC: 2.9 10*3/uL — ABNORMAL LOW (ref 4.0–10.5)
nRBC: 0 % (ref 0.0–0.2)

## 2018-12-11 LAB — COMPREHENSIVE METABOLIC PANEL
ALT: 19 U/L (ref 0–44)
AST: 24 U/L (ref 15–41)
Albumin: 3.9 g/dL (ref 3.5–5.0)
Alkaline Phosphatase: 73 U/L (ref 38–126)
Anion gap: 13 (ref 5–15)
BUN: 18 mg/dL (ref 6–20)
CO2: 23 mmol/L (ref 22–32)
Calcium: 8.7 mg/dL — ABNORMAL LOW (ref 8.9–10.3)
Chloride: 104 mmol/L (ref 98–111)
Creatinine, Ser: 1.21 mg/dL — ABNORMAL HIGH (ref 0.44–1.00)
GFR calc Af Amer: 59 mL/min — ABNORMAL LOW (ref >60)
GFR calc non Af Amer: 51 mL/min — ABNORMAL LOW (ref >60)
Glucose, Bld: 142 mg/dL — ABNORMAL HIGH (ref 70–99)
Potassium: 2.9 mmol/L — ABNORMAL LOW (ref 3.5–5.1)
Sodium: 140 mmol/L (ref 135–145)
Total Bilirubin: 0.8 mg/dL (ref 0.3–1.2)
Total Protein: 6.9 g/dL (ref 6.5–8.1)

## 2018-12-11 MED ORDER — LENALIDOMIDE 10 MG PO CAPS: ORAL_CAPSULE | ORAL | 2 refills | Status: DC

## 2018-12-11 MED ORDER — POTASSIUM CHLORIDE CRYS ER 20 MEQ PO TBCR
40.0000 meq | EXTENDED_RELEASE_TABLET | Freq: Once | ORAL | Status: AC
  Administered 2018-12-11: 40 meq via ORAL
  Filled 2018-12-11: qty 2

## 2018-12-11 MED ORDER — VALACYCLOVIR HCL 1 G PO TABS: 1000.0000 mg | ORAL_TABLET | Freq: Every day | ORAL | 1 refills | Status: DC

## 2018-12-11 MED ORDER — DOCOSANOL 10 % EX CREA: 0.5000 [in_us] | TOPICAL_CREAM | Freq: Two times a day (BID) | CUTANEOUS | 2 refills | Status: DC

## 2018-12-11 MED ORDER — BORTEZOMIB CHEMO SQ INJECTION 3.5 MG (2.5MG/ML)
1.3000 mg/m2 | Freq: Once | INTRAMUSCULAR | Status: AC
  Administered 2018-12-11: 3 mg via SUBCUTANEOUS
  Filled 2018-12-11: qty 1.2

## 2018-12-11 MED ORDER — PROCHLORPERAZINE MALEATE 10 MG PO TABS: 10.0000 mg | ORAL_TABLET | Freq: Once | ORAL | Status: DC

## 2018-12-11 NOTE — Patient Instructions (Addendum)
Joanna Reid at [facility] Discharge Instructions  You were seen today by Dr. Delton Coombes. He went over your recent  results. He will see you back in 4 weeks for labs, injections and follow up.   Thank you for choosing [place] at [facility] to provide your oncology and hematology care.  To afford each patient quality time with our provider, please arrive at least 15 minutes before your scheduled appointment time.   If you have a lab appointment with the [facility] please come in thru the  Main Entrance and check in at the main information desk  You need to re-schedule your appointment should you arrive 10 or more minutes late.  We strive to give you quality time with our providers, and arriving late affects you and other patients whose appointments are after yours.  Also, if you no show three or more times for appointments you may be dismissed from the clinic at the providers discretion.     Again, thank you for choosing [facility]  Our hope is that these requests will decrease the amount of time that you wait before being seen by our physicians.       _____________________________________________________________  Should you have questions after your visit to [facility], please contact our office at [phone] between the hours of 8:00 a.m. and 4:30 p.m.  Voicemails left after 4:00 p.m. will not be returned until the following business day.  For prescription refill requests, have your pharmacy contact our office and allow 72 hours.    Cancer Center Support Programs:   > Cancer Support Group  2nd Tuesday of the month 1pm-2pm, Journey Room

## 2018-12-11 NOTE — Assessment & Plan Note (Addendum)
1.  IgG lambda multiple myeloma, stage III, del 17p: - Presentation with lower back pain since March, presented to the emergency room on 10/25/2017 with right groin pain, CT scan showed multiple lytic lesions, multiple lytic lesions on skeletal survey - SPEP shows 1.5 g/dL of monoclonal protein, beta-2 microglobulin of 4.8, free lambda light chains of 2869, kappa by lambda ratio of less than 0, elevated LDH. 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  FISH panel shows -14, -12, 13 q.-/-13 and 17p-.  Bone marrow biopsy shows 90% plasma cells. -4 cycles of RVD from 11/01/2017 through 01/14/2018.  Labs done on 01/20/2018 showed M spike to be negative. - She was evaluated by Dr. Laverta Baltimore at [facility].  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 - Because of her high risk disease, consolidation with 2 cycles of chemotherapy was done with RVD from 06/11/2018 through 07/01/2018.  -Maintenance Velcade every 2 weeks and Revlimid 10 mg 3 weeks on 1 week off started on 07/29/2018.   - She was hospitalized with mild fever and respiratory symptoms including wheezing from 08/09/2018 through 08/11/2018.  A nasal swab apparently was positive for RSV.  - Myeloma panel at [facility] on 08/18/2018 was negative. -She will continue Revlimid 10 mg 3 weeks on 1 week off.  She finished last cycle a few days ago. -I have reviewed myeloma panel from 11/13/2018.  Serum immunofixation and SPEP were normal.  Free light chain ratio is 1.98, up from 0.74.  Kappa light chains are 55.8.  Lambda light chains at 28.1. - ANC is 1400.  Finished Revlimid on 12/09/2018.  She may proceed with her Velcade today. -I will see her back in 4 weeks for follow-up.  Her potassium is low at 2.9.  She will receive 40 mEq of potassium.  2.  Bone strengthening agents: -We will continue Zometa every 3 months.  She will also continue calcium and vitamin D.  3.   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  She will continue Lyrica 200 mg twice daily.  4.  ID prophylaxis: -We will continue acyclovir twice daily.  She was given a prescription for Valtrex for fever blisters.  5.  Hypomagnesemia: -She will take magnesium 2 tablets 3 times a day.  6.  Diabetes: - She is continuing metformin thousand milligrams twice daily. -She was recently started on Trulicity and Lantus 7 units at bedtime.

## 2018-12-11 NOTE — Progress Notes (Signed)
[place] [place], [place] [address]   CLINIC:  Medical Oncology/Hematology  PCP:  Mikey Kirschner, MD [address] [place] [address] [phone]   REASON FOR VISIT:  Follow-up for multiple myeloma    BRIEF ONCOLOGIC HISTORY:    Multiple myeloma ([place])   10/29/2017 Initial Diagnosis    Multiple myeloma ([place])    11/01/2017 - 01/24/2018 Chemotherapy    The patient had bortezomib SQ (VELCADE) chemo injection 3 mg, 1.3 mg/m2 = 3 mg, Subcutaneous,  Once, 5 of 5 cycles Administration: 3 mg (11/01/2017), 3 mg (11/08/2017), 3 mg (11/05/2017), 3 mg (11/22/2017), 3 mg (11/12/2017), 3 mg (11/29/2017), 3 mg (11/26/2017), 3 mg (12/03/2017), 3 mg (12/13/2017), 3 mg (12/20/2017), 3 mg (12/17/2017), 3 mg (12/24/2017), 3 mg (01/03/2018), 3 mg (01/10/2018), 3 mg (01/07/2018), 3 mg (01/14/2018), 3 mg (01/24/2018)  for chemotherapy treatment.     06/11/2018 -  Chemotherapy    The patient had bortezomib SQ (VELCADE) chemo injection 3 mg, 1.3 mg/m2 = 3 mg, Subcutaneous,  Once, 16 of 20 cycles Administration: 3 mg (06/11/2018), 3 mg (07/29/2018), 3 mg (08/21/2018), 3 mg (09/04/2018), 3 mg (09/18/2018), 3 mg (10/02/2018), 3 mg (10/16/2018), 3 mg (10/29/2018), 3 mg (11/13/2018), 3 mg (06/18/2018), 3 mg (06/25/2018), 3 mg (07/01/2018), 3 mg (07/08/2018), 3 mg (07/15/2018), 3 mg (11/27/2018), 3 mg (12/11/2018)  for chemotherapy treatment.       CANCER STAGING: Cancer Staging Multiple myeloma ([place]) Staging form: Plasma Cell Myeloma and Plasma Cell Disorders, AJCC 8th Edition - Clinical: No stage assigned - Unsigned    INTERVAL HISTORY:  Ms. Sak 53 y.o. female returns for routine follow-up and consideration for next cycle of chemotherapy.  She has finished taking Revlimid on 12/09/2018.  She did not experience any GI side effects including nausea vomiting or diarrhea.  She takes Revlimid 3 weeks on 1 week off.  She is tolerating Velcade every 2 weeks very well.  She denies any worsening of her  neuropathy.  Back pain is also not bothering her.  She is continue magnesium 2 tablets 3 times a day.  She had developed fever blisters inside her left nostril.  She requests Valtrex prescription.  Appetite is 100% and energy levels are 75%.    REVIEW OF SYSTEMS:  Review of Systems  Gastrointestinal: Negative for diarrhea.  All other systems reviewed and are negative.    PAST MEDICAL/SURGICAL HISTORY:  Past Medical History:  Diagnosis Date  . Acid reflux   . Allergic rhinitis   . Cancer ([place])    multiple myeloma  . Diabetes mellitus    type 2  . Gout   . Gout   . HBP (high blood pressure)   . History of kidney stones   . Migraines    Past Surgical History:  Procedure Laterality Date  . CESAREAN SECTION    . EXTRACORPOREAL SHOCK WAVE LITHOTRIPSY Left 10/10/2017   Procedure: LEFT EXTRACORPOREAL SHOCK WAVE LITHOTRIPSY (ESWL);  Surgeon: Bjorn Loser, MD;  Location: [facility];  Service: Urology;  Laterality: Left;  . EYE SURGERY    . HEMORRHOID SURGERY N/A 11/19/2012   Procedure: HEMORRHOIDECTOMY;  Surgeon: Jamesetta So, MD;  Location: [facility];  Service: General;  Laterality: N/A;  . kidney stones  1998  . LAPAROSCOPIC UNILATERAL SALPINGO OOPHERECTOMY  05/14/2012   Procedure: LAPAROSCOPIC UNILATERAL SALPINGO OOPHORECTOMY;  Surgeon: [name], MD;  Location: [facility];  Service: Gynecology;  Laterality: Right;  laparoscopic right salpingo-oophorectomy  .  PARTIAL HYSTERECTOMY    . TONSILECTOMY, ADENOIDECTOMY, BILATERAL MYRINGOTOMY AND TUBES    . VESICOVAGINAL FISTULA CLOSURE W/ TAH       SOCIAL HISTORY:  Social History   Socioeconomic History  . Marital status: Married    Spouse name: Not on file  . Number of children: Not on file  . Years of education: 85  . Highest education level: Not on file  Occupational History    Employer: [employer]  . Financial resource strain: Not on file  . Food insecurity:    Worry: Never true    Inability: Never true  .  Transportation needs:    Medical: Not on file    Non-medical: Not on file  Tobacco Use  . Smoking status: Former Smoker    Last attempt to quit: 02/27/1999    Years since quitting: 19.8  . Smokeless tobacco: Never Used  . Tobacco comment: socially   Substance and Sexual Activity  . Alcohol use: No    Alcohol/week: 0.0 standard drinks  . Drug use: No  . Sexual activity: Yes  Lifestyle  . Physical activity:    Days per week: Not on file    Minutes per session: Not on file  . Stress: Not on file  Relationships  . Social connections:    Talks on phone: Not on file    Gets together: Not on file    Attends religious service: Not on file    Active member of club or organization: Not on file    Attends meetings of clubs or organizations: Not on file    Relationship status: Not on file  . Intimate partner violence:    Fear of current or ex partner: Not on file    Emotionally abused: Not on file    Physically abused: Not on file    Forced sexual activity: Not on file  Other Topics Concern  . Not on file  Social History Narrative  . Not on file    FAMILY HISTORY:  Family History  Problem Relation Age of Onset  . Arthritis Other   . Cancer Other   . Diabetes Other   . Hypertension Mother   . Dementia Mother   . Diabetes Father   . ALS Father   . Diabetes Brother   . Hypertension Brother   . Cancer Paternal Aunt   . COPD Maternal Grandmother   . Cancer Maternal Grandfather   . Anesthesia problems Paternal Grandfather     CURRENT MEDICATIONS:  Outpatient Encounter Medications as of 12/11/2018  Medication Sig Note  . acyclovir (ZOVIRAX) 800 MG tablet Take 800 mg by mouth 2 (two) times daily.    Marland Kitchen albuterol (PROVENTIL HFA;VENTOLIN HFA) 108 (90 Base) MCG/ACT inhaler Inhale 2 puffs into the lungs every 4 (four) hours as needed for wheezing or shortness of breath.   . allopurinol (ZYLOPRIM) 300 MG tablet TAKE 1 TABLET BY MOUTH  DAILY FOR GOUT   . aspirin EC 81 MG tablet Take  81 mg by mouth daily.   . Bortezomib (VELCADE IJ) Inject as directed every 14 (fourteen) days.  08/09/2018: Due for next injection on 08/13/2018  . bumetanide (BUMEX) 1 MG tablet Take 1 tablet (1 mg total) by mouth every morning.   . carvedilol (COREG) 6.25 MG tablet Take 6.25 mg by mouth 2 (two) times daily with a meal.    . diazepam (VALIUM) 5 MG tablet Take 1 tablet (5 mg total) by mouth at bedtime.   Marland Kitchen  docusate sodium (COLACE) 100 MG capsule Take 100 mg by mouth daily.    . fluticasone (FLONASE) 50 MCG/ACT nasal spray Place 2 sprays into the nose 2 (two) times daily.   Marland Kitchen glucose blood (PRECISION QID TEST) test strip Use 1 each (1 strip total) 4 (four) times daily Use as instructed. OneTouch Verio   . HYDROcodone-acetaminophen (NORCO/VICODIN) 5-325 MG tablet Take 1 tablet by mouth at bedtime as needed for moderate pain.   Marland Kitchen insulin glargine (LANTUS) 100 UNIT/ML injection Inject 7 Units into the skin at bedtime.   Marland Kitchen LANTUS SOLOSTAR 100 UNIT/ML Solostar Pen    . lenalidomide (REVLIMID) 10 MG capsule TAKE 1 CAPSULE BY MOUTH  DAILY FOR 21 DAYS ON, THEN  7 DAYS OFF   . losartan (COZAAR) 100 MG tablet TAKE 1 TABLET BY MOUTH  EVERY MORNING   . magnesium oxide (MAG-OX) 400 (241.3 Mg) MG tablet Take 1 tablet (400 mg total) by mouth 3 (three) times daily.   . metFORMIN (GLUCOPHAGE) 500 MG tablet TAKE 1 TABLET BY MOUTH ONCE DAILY WITH BREAKFAST (Patient taking differently: Take 1,000 mg by mouth 2 (two) times daily with a meal. )   . omeprazole (PRILOSEC) 20 MG capsule TAKE 1 CAPSULE BY MOUTH  DAILY   . ondansetron (ZOFRAN) 8 MG tablet Take 1 tablet (8 mg total) by mouth every 8 (eight) hours as needed.   . potassium chloride SA (K-DUR,KLOR-CON) 20 MEQ tablet Take 20 mEq by mouth daily.    . pregabalin (LYRICA) 200 MG capsule TAKE (1) CAPSULE BY MOUTH TWICE DAILY.   Marland Kitchen prochlorperazine (COMPAZINE) 10 MG tablet Take 1 tablet (10 mg total) by mouth every 6 (six) hours as needed for nausea or vomiting.  08/09/2018: Takes prior to Velcade injection  . rosuvastatin (CRESTOR) 5 MG tablet Take 5 mg by mouth daily.    . TRULICITY 1.61 WR/6.0AV SOPN Inject 0.75 mg into the skin every Monday.    . verapamil (CALAN-SR) 240 MG CR tablet TAKE 1 TABLET BY MOUTH AT  BEDTIME   . [DISCONTINUED] lenalidomide (REVLIMID) 10 MG capsule TAKE 1 CAPSULE BY MOUTH  DAILY FOR 21 DAYS ON, THEN  7 DAYS OFF   . Docosanol 10 % CREA Apply 0.5 inches topically 2 (two) times a day.   . valACYclovir (VALTREX) 1000 MG tablet Take 1 tablet (1,000 mg total) by mouth daily. Take 2 tablets now and 2 tablets 12 hours later    No facility-administered encounter medications on file as of 12/11/2018.     ALLERGIES:  No Known Allergies   PHYSICAL EXAM:  ECOG Performance status: 1  Vitals:   12/11/18 0900  BP: (!) 154/61  Pulse: 66  Resp: 16  Temp: 97.7 F (36.5 C)  SpO2: 99%   Filed Weights   12/11/18 0900  Weight: 269 lb 3 oz (122.1 kg)    Physical Exam Vitals signs reviewed.  Constitutional:      Appearance: Normal appearance.  Cardiovascular:     Rate and Rhythm: Normal rate and regular rhythm.     Heart sounds: Normal heart sounds.  Pulmonary:     Effort: Pulmonary effort is normal.     Breath sounds: Normal breath sounds.  Abdominal:     General: There is no distension.     Palpations: Abdomen is soft. There is no mass.  Musculoskeletal:        General: No swelling.  Skin:    General: Skin is warm.  Neurological:     General:  No focal deficit present.     Mental Status: She is alert and oriented to person, place, and time.  Psychiatric:        Mood and Affect: Mood normal.        Behavior: Behavior normal.      LABORATORY DATA:  I have reviewed the labs as listed.  CBC    Component Value Date/Time   WBC 2.9 (L) 12/11/2018 0931   RBC 3.18 (L) 12/11/2018 0931   HGB 9.8 (L) 12/11/2018 0931   HGB 14.2 08/02/2015 1113   HCT 29.5 (L) 12/11/2018 0931   HCT 41.1 08/02/2015 1113   PLT 186  12/11/2018 0931   PLT 371 08/02/2015 1113   MCV 92.8 12/11/2018 0931   MCV 84 08/02/2015 1113   MCH 30.8 12/11/2018 0931   MCHC 33.2 12/11/2018 0931   RDW 16.6 (H) 12/11/2018 0931   RDW 15.1 08/02/2015 1113   LYMPHSABS 0.7 12/11/2018 0931   LYMPHSABS 2.9 08/02/2015 1113   MONOABS 0.3 12/11/2018 0931   EOSABS 0.2 12/11/2018 0931   EOSABS 0.2 08/02/2015 1113   BASOSABS 0.0 12/11/2018 0931   BASOSABS 0.1 08/02/2015 1113   CMP Latest Ref Rng & Units 12/11/2018 11/27/2018 11/13/2018  Glucose 70 - 99 mg/dL 142(H) 137(H) 140(H)  BUN 6 - 20 mg/dL 18 18 17   Creatinine 0.44 - 1.00 mg/dL 1.21(H) 1.16(H) 1.09(H)  Sodium 135 - 145 mmol/L 140 142 140  Potassium 3.5 - 5.1 mmol/L 2.9(L) 3.7 3.8  Chloride 98 - 111 mmol/L 104 104 105  CO2 22 - 32 mmol/L 23 24 23   Calcium 8.9 - 10.3 mg/dL 8.7(L) 9.5 8.8(L)  Total Protein 6.5 - 8.1 g/dL 6.9 7.4 6.8  Total Bilirubin 0.3 - 1.2 mg/dL 0.8 0.7 0.8  Alkaline Phos 38 - 126 U/L 73 73 70  AST 15 - 41 U/L 24 34 27  ALT 0 - 44 U/L 19 21 21        DIAGNOSTIC IMAGING:  I have independently reviewed the scans and discussed with the patient.   I have reviewed Venita Lick LPN's note and agree with the documentation.  I personally performed a face-to-face visit, made revisions and my assessment and plan is as follows.    ASSESSMENT & PLAN:   Multiple myeloma ([place]) 1.  IgG lambda multiple myeloma, stage III, del 17p: - Presentation with lower back pain since March, presented to the emergency room on 10/25/2017 with right groin pain, CT scan showed multiple lytic lesions, multiple lytic lesions on skeletal survey - SPEP shows 1.5 g/dL of monoclonal protein, beta-2 microglobulin of 4.8, free lambda light chains of 2869, kappa by lambda ratio of less than 0, elevated LDH. 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  FISH panel shows -14, -12, 13 q.-/-13 and 17p-.  Bone marrow biopsy shows 90% plasma cells. -4 cycles of RVD from 11/01/2017 through  01/14/2018.  Labs done on 01/20/2018 showed M spike to be negative. - She was evaluated by Dr. Laverta Baltimore at [facility].  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 - Because of her high risk disease, consolidation with 2 cycles of chemotherapy was done with RVD from 06/11/2018 through 07/01/2018.  -Maintenance Velcade every 2 weeks and Revlimid 10 mg 3 weeks on 1 week off started on 07/29/2018.   - She was hospitalized with mild fever and respiratory symptoms including wheezing from 08/09/2018 through 08/11/2018.  A nasal swab apparently was positive for RSV.  -  Myeloma panel at [facility] on 08/18/2018 was negative. -She will continue Revlimid 10 mg 3 weeks on 1 week off.  She finished last cycle a few days ago. -I have reviewed myeloma panel from 11/13/2018.  Serum immunofixation and SPEP were normal.  Free light chain ratio is 1.98, up from 0.74.  Kappa light chains are 55.8.  Lambda light chains at 28.1. - ANC is 1400.  Finished Revlimid on 12/09/2018.  She may proceed with her Velcade today. -I will see her back in 4 weeks for follow-up.  Her potassium is low at 2.9.  She will receive 40 mEq of potassium.  2.  Bone strengthening agents: -We will continue Zometa every 3 months.  She will also continue calcium and vitamin D.  3.  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  She will continue Lyrica 200 mg twice daily.  4.  ID prophylaxis: -We will continue acyclovir twice daily.  She was given a prescription for Valtrex for fever blisters.  5.  Hypomagnesemia: -She will take magnesium 2 tablets 3 times a day.  6.  Diabetes: - She is continuing metformin thousand milligrams twice daily. -She was recently started on Trulicity and Lantus 7 units at bedtime.   Total time spent is 25 minutes with more than 50% of the time spent face-to-face discussing treatment plan and coordination of  care.  Orders placed this encounter:  Orders Placed This Encounter  Procedures  . CBC with Differential/Platelet  . Comprehensive metabolic panel  . Protein electrophoresis, serum  . Kappa/lambda light chains  . Lactate dehydrogenase  . Immunofixation electrophoresis  . Magnesium      Derek Jack, MD [place] [phone]

## 2018-12-11 NOTE — Progress Notes (Signed)
12/11/18  Received order to give Potassium Chloride ER 40 mEq X 1 dose today with K+ of 2.9.  Orders entered.  V.O. Dr Beckey Downing LPN/Zainab Crumrine Ronnald Ramp, PharmD

## 2018-12-11 NOTE — Progress Notes (Signed)
Labs reviewed at office visit today . Will proceed with treatment per MD. Also give potassium per orders.   Velcade given per orders. Patient tolerated it well without problems. Vitals stable and discharged home from clinic ambulatory. Follow up as scheduled.

## 2018-12-15 ENCOUNTER — Telehealth (HOSPITAL_COMMUNITY): Payer: Self-pay | Admitting: Hematology

## 2018-12-15 ENCOUNTER — Other Ambulatory Visit: Payer: Self-pay | Admitting: Family Medicine

## 2018-12-15 NOTE — Telephone Encounter (Signed)
OBTAINED BOTH INS AND [employer] AUTH.  CONTACTED BY TO MAKE HER AWARE

## 2018-12-17 ENCOUNTER — Telehealth (HOSPITAL_COMMUNITY): Payer: Self-pay | Admitting: Hematology

## 2018-12-17 NOTE — Telephone Encounter (Signed)
VM FROM [facility]. STATING OUR FACILITY  IS CONSIDERED OON FOR THE PT. HOWEVER, [facility] WOULD BE . CALLED BACK AND QUESTIONED WHY WE WOULD NOT BE SINCE WE SHARE THE SAME TAX ID AND FAC NPI? WAITING FOR A RETURN CALL.

## 2018-12-22 ENCOUNTER — Other Ambulatory Visit (HOSPITAL_COMMUNITY): Payer: Self-pay | Admitting: *Deleted

## 2018-12-22 DIAGNOSIS — C9000 Multiple myeloma not having achieved remission: Secondary | ICD-10-CM

## 2018-12-22 NOTE — Progress Notes (Signed)
Orders received from [facility] to draw Hgb A1C on her next visit.  Orders have been placed and linked to next appointment.

## 2018-12-25 ENCOUNTER — Other Ambulatory Visit: Payer: Self-pay

## 2018-12-25 ENCOUNTER — Other Ambulatory Visit (HOSPITAL_COMMUNITY): Payer: Self-pay | Admitting: *Deleted

## 2018-12-25 ENCOUNTER — Inpatient Hospital Stay (HOSPITAL_COMMUNITY): Payer: BC Managed Care – PPO

## 2018-12-25 VITALS — BP 122/80 | HR 78 | Temp 97.6°F | Resp 18 | Wt 271.2 lb

## 2018-12-25 DIAGNOSIS — C9000 Multiple myeloma not having achieved remission: Secondary | ICD-10-CM

## 2018-12-25 DIAGNOSIS — Z5112 Encounter for antineoplastic immunotherapy: Secondary | ICD-10-CM | POA: Diagnosis not present

## 2018-12-25 LAB — CBC WITH DIFFERENTIAL/PLATELET
Abs Immature Granulocytes: 0.02 10*3/uL (ref 0.00–0.07)
Basophils Absolute: 0 10*3/uL (ref 0.0–0.1)
Basophils Relative: 1 %
Eosinophils Absolute: 0.1 10*3/uL (ref 0.0–0.5)
Eosinophils Relative: 3 %
HCT: 31.5 % — ABNORMAL LOW (ref 36.0–46.0)
Hemoglobin: 10.3 g/dL — ABNORMAL LOW (ref 12.0–15.0)
Immature Granulocytes: 1 %
Lymphocytes Relative: 22 %
Lymphs Abs: 0.7 10*3/uL (ref 0.7–4.0)
MCH: 31.1 pg (ref 26.0–34.0)
MCHC: 32.7 g/dL (ref 30.0–36.0)
MCV: 95.2 fL (ref 80.0–100.0)
Monocytes Absolute: 0.2 10*3/uL (ref 0.1–1.0)
Monocytes Relative: 6 %
Neutro Abs: 2.2 10*3/uL (ref 1.7–7.7)
Neutrophils Relative %: 67 %
Platelets: 195 10*3/uL (ref 150–400)
RBC: 3.31 MIL/uL — ABNORMAL LOW (ref 3.87–5.11)
RDW: 16.7 % — ABNORMAL HIGH (ref 11.5–15.5)
WBC: 3.2 10*3/uL — ABNORMAL LOW (ref 4.0–10.5)
nRBC: 0 % (ref 0.0–0.2)

## 2018-12-25 LAB — COMPREHENSIVE METABOLIC PANEL
ALT: 24 U/L (ref 0–44)
AST: 36 U/L (ref 15–41)
Albumin: 4 g/dL (ref 3.5–5.0)
Alkaline Phosphatase: 75 U/L (ref 38–126)
Anion gap: 16 — ABNORMAL HIGH (ref 5–15)
BUN: 16 mg/dL (ref 6–20)
CO2: 24 mmol/L (ref 22–32)
Calcium: 9 mg/dL (ref 8.9–10.3)
Chloride: 101 mmol/L (ref 98–111)
Creatinine, Ser: 1.15 mg/dL — ABNORMAL HIGH (ref 0.44–1.00)
GFR calc Af Amer: >60 mL/min (ref >60)
GFR calc non Af Amer: 54 mL/min — ABNORMAL LOW (ref >60)
Glucose, Bld: 130 mg/dL — ABNORMAL HIGH (ref 70–99)
Potassium: 3.5 mmol/L (ref 3.5–5.1)
Sodium: 141 mmol/L (ref 135–145)
Total Bilirubin: 0.7 mg/dL (ref 0.3–1.2)
Total Protein: 7.2 g/dL (ref 6.5–8.1)

## 2018-12-25 LAB — URINALYSIS, COMPLETE (UACMP) WITH MICROSCOPIC
Bilirubin Urine: NEGATIVE
Glucose, UA: NEGATIVE mg/dL
Hgb urine dipstick: NEGATIVE
Ketones, ur: NEGATIVE mg/dL
Nitrite: NEGATIVE
Protein, ur: NEGATIVE mg/dL
Specific Gravity, Urine: 1.008 (ref 1.005–1.030)
pH: 5 (ref 5.0–8.0)

## 2018-12-25 LAB — MAGNESIUM: Magnesium: 1.5 mg/dL — ABNORMAL LOW (ref 1.7–2.4)

## 2018-12-25 LAB — HEMOGLOBIN A1C
Hgb A1c MFr Bld: 5.7 % — ABNORMAL HIGH (ref 4.8–5.6)
Mean Plasma Glucose: 116.89 mg/dL

## 2018-12-25 MED ORDER — BORTEZOMIB CHEMO SQ INJECTION 3.5 MG (2.5MG/ML)
1.3000 mg/m2 | Freq: Once | INTRAMUSCULAR | Status: AC
  Administered 2018-12-25: 13:00:00 3 mg via SUBCUTANEOUS
  Filled 2018-12-25: qty 1.2

## 2018-12-25 MED ORDER — PROCHLORPERAZINE MALEATE 10 MG PO TABS: 10.0000 mg | ORAL_TABLET | Freq: Once | ORAL | Status: DC

## 2018-12-25 NOTE — Progress Notes (Signed)
Lab work and UA reviewed with Dr. Delton Coombes and patient approved for Velcade injection today. Pt tolerated injection without incident or complaint. VSS. Discharged self ambulatory in satisfactory condition.

## 2018-12-25 NOTE — Patient Instructions (Signed)
[facility] Discharge Instructions for Patients Receiving Chemotherapy   Beginning January 23rd 2017 lab work for the Cancer Center will be done in the  Main lab at [place] on 1st floor. If you have a lab appointment with the Cancer Center please come in thru the  Main Entrance and check in at the main information desk   Today you received the following chemotherapy agents Velcade  To help prevent nausea and vomiting after your treatment, we encourage you to take your nausea medication    If you develop nausea and vomiting, or diarrhea that is not controlled by your medication, call the clinic.  The clinic phone number is [phone]. Office hours are Monday-Friday 8:30am-5:00pm.  BELOW ARE SYMPTOMS THAT SHOULD BE REPORTED IMMEDIATELY:  *FEVER GREATER THAN 101.0 F  *CHILLS WITH OR WITHOUT FEVER  NAUSEA AND VOMITING THAT IS NOT CONTROLLED WITH YOUR NAUSEA MEDICATION  *UNUSUAL SHORTNESS OF BREATH  *UNUSUAL BRUISING OR BLEEDING  TENDERNESS IN MOUTH AND THROAT WITH OR WITHOUT PRESENCE OF ULCERS  *URINARY PROBLEMS  *BOWEL PROBLEMS  UNUSUAL RASH Items with * indicate a potential emergency and should be followed up as soon as possible. If you have an emergency after office hours please contact your primary care physician or go to the nearest emergency department.  Please call the clinic during office hours if you have any questions or concerns.   You may also contact the Patient Navigator at [phone] should you have any questions or need assistance in obtaining follow up care.      Resources For Cancer Patients and their Caregivers ? American Cancer Society: Can assist with transportation, wigs, general needs, runs Look Good Feel Better.        1-888-227-6333 ? Cancer Care: Provides financial assistance, online support groups, medication/co-pay assistance.  1-800-813-HOPE (4673) ? [facility] Assists [place] cancer  patients and their families through emotional , educational and financial support.  336-427-4357 ? [place] DSS Where to apply for food stamps, Medicaid and utility assistance. 336-342-1394 ? [facility]: Transportation to medical appointments. 336-347-2287 ? Social Security Administration: May apply for disability if have a Stage IV cancer. 336-342-7796 1-800-772-1213 ? [place] Aging, Disability and Transit Services: Assists with nutrition, care and transit needs. 336-349-2343          

## 2018-12-29 ENCOUNTER — Encounter (HOSPITAL_COMMUNITY): Payer: Self-pay | Admitting: *Deleted

## 2018-12-29 ENCOUNTER — Other Ambulatory Visit: Payer: Self-pay | Admitting: Family Medicine

## 2018-12-29 ENCOUNTER — Other Ambulatory Visit (HOSPITAL_COMMUNITY): Payer: Self-pay | Admitting: Hematology

## 2018-12-29 DIAGNOSIS — C9000 Multiple myeloma not having achieved remission: Secondary | ICD-10-CM

## 2018-12-29 DIAGNOSIS — T451X5A Adverse effect of antineoplastic and immunosuppressive drugs, initial encounter: Secondary | ICD-10-CM

## 2018-12-29 DIAGNOSIS — G620 Drug-induced polyneuropathy: Secondary | ICD-10-CM

## 2018-12-29 NOTE — Progress Notes (Signed)
Orders received for Hgb A1C from Dessa Phi, NP.  A1C drawn and results were faxed back to [facility].

## 2018-12-30 NOTE — Telephone Encounter (Signed)
Please call and schedule and then let nurses know so we can send in refill. thanks

## 2018-12-30 NOTE — Telephone Encounter (Signed)
Schedule virtual visit within the next 30 days May have 30-day refill

## 2018-12-31 ENCOUNTER — Other Ambulatory Visit (HOSPITAL_COMMUNITY): Payer: Self-pay | Admitting: Hematology

## 2018-12-31 DIAGNOSIS — C9000 Multiple myeloma not having achieved remission: Secondary | ICD-10-CM

## 2018-12-31 NOTE — Telephone Encounter (Signed)
Pt is scheduled for 7/6 med check

## 2019-01-08 ENCOUNTER — Other Ambulatory Visit: Payer: Self-pay

## 2019-01-08 ENCOUNTER — Inpatient Hospital Stay (HOSPITAL_COMMUNITY): Payer: BC Managed Care – PPO | Attending: Hematology | Admitting: Hematology

## 2019-01-08 ENCOUNTER — Inpatient Hospital Stay (HOSPITAL_COMMUNITY): Payer: BC Managed Care – PPO | Attending: Hematology

## 2019-01-08 ENCOUNTER — Encounter (HOSPITAL_COMMUNITY): Payer: Self-pay | Admitting: Hematology

## 2019-01-08 ENCOUNTER — Inpatient Hospital Stay (HOSPITAL_COMMUNITY): Payer: BC Managed Care – PPO

## 2019-01-08 VITALS — BP 112/74 | HR 73 | Temp 97.4°F | Resp 18 | Wt 268.6 lb

## 2019-01-08 DIAGNOSIS — C9001 Multiple myeloma in remission: Secondary | ICD-10-CM

## 2019-01-08 DIAGNOSIS — C9000 Multiple myeloma not having achieved remission: Secondary | ICD-10-CM | POA: Diagnosis not present

## 2019-01-08 DIAGNOSIS — Z794 Long term (current) use of insulin: Secondary | ICD-10-CM | POA: Insufficient documentation

## 2019-01-08 DIAGNOSIS — Z5112 Encounter for antineoplastic immunotherapy: Secondary | ICD-10-CM | POA: Insufficient documentation

## 2019-01-08 DIAGNOSIS — G620 Drug-induced polyneuropathy: Secondary | ICD-10-CM | POA: Insufficient documentation

## 2019-01-08 DIAGNOSIS — Z87891 Personal history of nicotine dependence: Secondary | ICD-10-CM | POA: Diagnosis not present

## 2019-01-08 DIAGNOSIS — E119 Type 2 diabetes mellitus without complications: Secondary | ICD-10-CM | POA: Diagnosis not present

## 2019-01-08 DIAGNOSIS — R102 Pelvic and perineal pain: Secondary | ICD-10-CM

## 2019-01-08 DIAGNOSIS — R197 Diarrhea, unspecified: Secondary | ICD-10-CM | POA: Diagnosis not present

## 2019-01-08 LAB — CBC WITH DIFFERENTIAL/PLATELET
Abs Immature Granulocytes: 0.02 10*3/uL (ref 0.00–0.07)
Basophils Absolute: 0 10*3/uL (ref 0.0–0.1)
Basophils Relative: 1 %
Eosinophils Absolute: 0.2 10*3/uL (ref 0.0–0.5)
Eosinophils Relative: 7 %
HCT: 30.8 % — ABNORMAL LOW (ref 36.0–46.0)
Hemoglobin: 10.2 g/dL — ABNORMAL LOW (ref 12.0–15.0)
Immature Granulocytes: 1 %
Lymphocytes Relative: 28 %
Lymphs Abs: 0.8 10*3/uL (ref 0.7–4.0)
MCH: 31.3 pg (ref 26.0–34.0)
MCHC: 33.1 g/dL (ref 30.0–36.0)
MCV: 94.5 fL (ref 80.0–100.0)
Monocytes Absolute: 0.3 10*3/uL (ref 0.1–1.0)
Monocytes Relative: 11 %
Neutro Abs: 1.4 10*3/uL — ABNORMAL LOW (ref 1.7–7.7)
Neutrophils Relative %: 52 %
Platelets: 165 10*3/uL (ref 150–400)
RBC: 3.26 MIL/uL — ABNORMAL LOW (ref 3.87–5.11)
RDW: 16.4 % — ABNORMAL HIGH (ref 11.5–15.5)
WBC: 2.8 10*3/uL — ABNORMAL LOW (ref 4.0–10.5)
nRBC: 0 % (ref 0.0–0.2)

## 2019-01-08 LAB — COMPREHENSIVE METABOLIC PANEL
ALT: 29 U/L (ref 0–44)
AST: 36 U/L (ref 15–41)
Albumin: 4 g/dL (ref 3.5–5.0)
Alkaline Phosphatase: 77 U/L (ref 38–126)
Anion gap: 16 — ABNORMAL HIGH (ref 5–15)
BUN: 15 mg/dL (ref 6–20)
CO2: 23 mmol/L (ref 22–32)
Calcium: 9.1 mg/dL (ref 8.9–10.3)
Chloride: 102 mmol/L (ref 98–111)
Creatinine, Ser: 1.13 mg/dL — ABNORMAL HIGH (ref 0.44–1.00)
GFR calc Af Amer: >60 mL/min (ref >60)
GFR calc non Af Amer: 55 mL/min — ABNORMAL LOW (ref >60)
Glucose, Bld: 119 mg/dL — ABNORMAL HIGH (ref 70–99)
Potassium: 3.4 mmol/L — ABNORMAL LOW (ref 3.5–5.1)
Sodium: 141 mmol/L (ref 135–145)
Total Bilirubin: 1.1 mg/dL (ref 0.3–1.2)
Total Protein: 7.5 g/dL (ref 6.5–8.1)

## 2019-01-08 LAB — LACTATE DEHYDROGENASE: LDH: 128 U/L (ref 98–192)

## 2019-01-08 LAB — MAGNESIUM: Magnesium: 1.3 mg/dL — ABNORMAL LOW (ref 1.7–2.4)

## 2019-01-08 MED ORDER — SODIUM CHLORIDE 0.9 % IV SOLN
INTRAVENOUS | Status: DC
  Administered 2019-01-08: 13:00:00 via INTRAVENOUS

## 2019-01-08 MED ORDER — BORTEZOMIB CHEMO SQ INJECTION 3.5 MG (2.5MG/ML)
1.3000 mg/m2 | Freq: Once | INTRAMUSCULAR | Status: AC
  Administered 2019-01-08: 14:00:00 3 mg via SUBCUTANEOUS
  Filled 2019-01-08: qty 1.2

## 2019-01-08 MED ORDER — PROCHLORPERAZINE MALEATE 10 MG PO TABS: 10.0000 mg | ORAL_TABLET | Freq: Once | ORAL | Status: DC

## 2019-01-08 MED ORDER — MAGNESIUM SULFATE 2 GM/50ML IV SOLN
2.0000 g | Freq: Once | INTRAVENOUS | Status: AC
  Administered 2019-01-08: 2 g via INTRAVENOUS
  Filled 2019-01-08: qty 50

## 2019-01-08 NOTE — Progress Notes (Signed)
1220 Labs reviewed with and pt seen by Dr. Delton Coombes and pt approved for Velcade injection today along with Magnesium infusion per MD                                                     Joanna Reid tolerated Velcade injection and Magnesium infusion well without complaints or incident. VSS upon discharge. Pt discharged self ambulatory in satisfactory condition

## 2019-01-08 NOTE — Assessment & Plan Note (Addendum)
1.  IgG lambda multiple myeloma, stage III, del 17p: - Presentation with lower back pain since March, presented to the emergency room on 10/25/2017 with right groin pain, CT scan showed multiple lytic lesions, multiple lytic lesions on skeletal survey - SPEP shows 1.5 g/dL of monoclonal protein, beta-2 microglobulin of 4.8, free lambda light chains of 2869, kappa by lambda ratio of less than 0, elevated LDH. 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  FISH panel shows -14, -12, 13 q.-/-13 and 17p-.  Bone marrow biopsy shows 90% plasma cells. -4 cycles of RVD from 11/01/2017 through 01/14/2018.  Labs done on 01/20/2018 showed M spike to be negative. - Joanna Reid was evaluated by Dr. Laverta Baltimore at [facility].  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 - Because of her high risk disease, consolidation with 2 cycles of chemotherapy was done with RVD from 06/11/2018 through 07/01/2018.  -Maintenance Velcade every 2 weeks and Revlimid 10 mg 3 weeks on 1 week off started on 07/29/2018.   - Joanna Reid was hospitalized with mild fever and respiratory symptoms including wheezing from 08/09/2018 through 08/11/2018.  A nasal swab apparently was positive for RSV.  - Myeloma panel at [facility] on 08/18/2018 was negative. -Joanna Reid is continuing Revlimid 10 mg 3 weeks on 1 week off.  Joanna Reid is getting Velcade every other week. -We reviewed myeloma panel from 11/13/2018.  SPEP is negative.  Free light chain ratio has gone up to 1.98.  This was previously 0.74.  Kappa light chains at 55.5.  Lambda light chains are 28.1.  Creatinine is 1.1. -Joanna Reid is complaining of pain in her left lower quadrant.  Joanna Reid reportedly had similar pain on the right side when Joanna Reid had ovarian cyst problems.  We will order a transvaginal pelvic ultrasound.  2.  Bone strengthening agents: -We will continue Zometa every 3 months.  Joanna Reid will also continue calcium and vitamin D.  3.   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  Joanna Reid will continue Lyrica 200 mg twice daily.  4.  ID prophylaxis: -Joanna Reid will continue acyclovir twice daily.  5.  Hypomagnesemia: -Joanna Reid is taking 2 tablets 3 times a day of magnesium. -Magnesium is 1.3 today.  Joanna Reid will receive 2 g IV. -This is from a combination of Revlimid and metformin.  Joanna Reid gets diarrhea from metformin.  Joanna Reid does have about 3 watery bowel movements per day.  6.  Diabetes: -Joanna Reid is taking metformin thousand milligrams twice daily. -Joanna Reid is taking Lantus 7 units at bedtime.  Her last hemoglobin A1c was 5.7.

## 2019-01-08 NOTE — Progress Notes (Signed)
[facility] [place], Joanna Reid [id number]   CLINIC:  Medical Oncology/Hematology  PCP:  Joanna Kirschner, MD [address] [place] [address] [phone]   REASON FOR VISIT:  Follow-up for multiple myeloma    BRIEF ONCOLOGIC HISTORY:  Oncology History  Multiple myeloma ([place])  10/29/2017 Initial Diagnosis   Multiple myeloma ([place])   11/01/2017 - 01/24/2018 Chemotherapy   The patient had bortezomib SQ (VELCADE) chemo injection 3 mg, 1.3 mg/m2 = 3 mg, Subcutaneous,  Once, 5 of 5 cycles Administration: 3 mg (11/01/2017), 3 mg (11/08/2017), 3 mg (11/05/2017), 3 mg (11/22/2017), 3 mg (11/12/2017), 3 mg (11/29/2017), 3 mg (11/26/2017), 3 mg (12/03/2017), 3 mg (12/13/2017), 3 mg (12/20/2017), 3 mg (12/17/2017), 3 mg (12/24/2017), 3 mg (01/03/2018), 3 mg (01/10/2018), 3 mg (01/07/2018), 3 mg (01/14/2018), 3 mg (01/24/2018)  for chemotherapy treatment.    06/11/2018 -  Chemotherapy   The patient had bortezomib SQ (VELCADE) chemo injection 3 mg, 1.3 mg/m2 = 3 mg, Subcutaneous,  Once, 18 of 20 cycles Administration: 3 mg (06/11/2018), 3 mg (07/29/2018), 3 mg (08/21/2018), 3 mg (09/04/2018), 3 mg (09/18/2018), 3 mg (10/02/2018), 3 mg (10/16/2018), 3 mg (10/29/2018), 3 mg (11/13/2018), 3 mg (06/18/2018), 3 mg (06/25/2018), 3 mg (07/01/2018), 3 mg (07/08/2018), 3 mg (07/15/2018), 3 mg (11/27/2018), 3 mg (12/11/2018), 3 mg (12/25/2018), 3 mg (01/08/2019)  for chemotherapy treatment.       CANCER STAGING: Cancer Staging Multiple myeloma ([place]) Staging form: Plasma Cell Myeloma and Plasma Cell Disorders, AJCC 8th Edition - Clinical: No stage assigned - Unsigned    INTERVAL HISTORY:  [name] 53 y.o. female returns for follow-up of her multiple myeloma.  She took her last Revlimid last Tuesday night.  She is having diarrhea up to 3 times per day.  She is also taking metformin 2000 mg daily.  She is taking magnesium 2 tablets 3 times a day.  Denies any fevers or infections.  No chills reported.   Denies any nausea vomiting or constipation.  Denies any bleeding per rectum or melena.  Complains of pain in the left lower quadrant.  She thinks it is coming from the left ovary.  Appetite is 100%.  Energy levels are 75%.  Neuropathy is well controlled with Lyrica.    REVIEW OF SYSTEMS:  Review of Systems  Gastrointestinal: Negative for diarrhea.  All other systems reviewed and are negative.    PAST MEDICAL/SURGICAL HISTORY:  Past Medical History:  Diagnosis Date  . Acid reflux   . Allergic rhinitis   . Cancer ([place])    multiple myeloma  . Diabetes mellitus    type 2  . Gout   . Gout   . HBP (high blood pressure)   . History of kidney stones   . Migraines    Past Surgical History:  Procedure Laterality Date  . CESAREAN SECTION    . EXTRACORPOREAL SHOCK WAVE LITHOTRIPSY Left 10/10/2017   Procedure: LEFT EXTRACORPOREAL SHOCK WAVE LITHOTRIPSY (ESWL);  Surgeon: Bjorn Loser, MD;  Location: [facility];  Service: Urology;  Laterality: Left;  . EYE SURGERY    . HEMORRHOID SURGERY N/A 11/19/2012   Procedure: HEMORRHOIDECTOMY;  Surgeon: Jamesetta So, MD;  Location: [facility];  Service: General;  Laterality: N/A;  . kidney stones  1998  . LAPAROSCOPIC UNILATERAL SALPINGO OOPHERECTOMY  05/14/2012   Procedure: LAPAROSCOPIC UNILATERAL SALPINGO OOPHORECTOMY;  Surgeon: [name], MD;  Location: [facility];  Service: Gynecology;  Laterality: Right;  laparoscopic right salpingo-oophorectomy  . PARTIAL HYSTERECTOMY    . TONSILECTOMY, ADENOIDECTOMY, BILATERAL MYRINGOTOMY AND TUBES    . VESICOVAGINAL FISTULA CLOSURE W/ TAH       SOCIAL HISTORY:  Social History   Socioeconomic History  . Marital status: Married    Spouse name: Not on file  . Number of children: Not on file  . Years of education: 57  . Highest education level: Not on file  Occupational History    Employer: [employer]  . Financial resource strain: Not on file  . Food insecurity    Worry: Never true     Inability: Never true  . Transportation needs    Medical: Not on file    Non-medical: Not on file  Tobacco Use  . Smoking status: Former Smoker    Quit date: 02/27/1999    Years since quitting: 19.8  . Smokeless tobacco: Never Used  . Tobacco comment: socially   Substance and Sexual Activity  . Alcohol use: No    Alcohol/week: 0.0 standard drinks  . Drug use: No  . Sexual activity: Yes  Lifestyle  . Physical activity    Days per week: Not on file    Minutes per session: Not on file  . Stress: Not on file  Relationships  . Social Herbalist on phone: Not on file    Gets together: Not on file    Attends religious service: Not on file    Active member of club or organization: Not on file    Attends meetings of clubs or organizations: Not on file    Relationship status: Not on file  . Intimate partner violence    Fear of current or ex partner: Not on file    Emotionally abused: Not on file    Physically abused: Not on file    Forced sexual activity: Not on file  Other Topics Concern  . Not on file  Social History Narrative  . Not on file    FAMILY HISTORY:  Family History  Problem Relation Age of Onset  . Arthritis Other   . Cancer Other   . Diabetes Other   . Hypertension Mother   . Dementia Mother   . Diabetes Father   . ALS Father   . Diabetes Brother   . Hypertension Brother   . Cancer Paternal Aunt   . COPD Maternal Grandmother   . Cancer Maternal Grandfather   . Anesthesia problems Paternal Grandfather     CURRENT MEDICATIONS:  Outpatient Encounter Medications as of 01/08/2019  Medication Sig Note  . acyclovir (ZOVIRAX) 800 MG tablet Take 800 mg by mouth 2 (two) times daily.    Marland Kitchen albuterol (PROVENTIL HFA;VENTOLIN HFA) 108 (90 Base) MCG/ACT inhaler Inhale 2 puffs into the lungs every 4 (four) hours as needed for wheezing or shortness of breath.   . allopurinol (ZYLOPRIM) 300 MG tablet TAKE 1 TABLET BY MOUTH  DAILY FOR GOUT   . aspirin EC 81 MG  tablet Take 81 mg by mouth daily.   . Bortezomib (VELCADE IJ) Inject as directed every 14 (fourteen) days.  08/09/2018: Due for next injection on 08/13/2018  . bumetanide (BUMEX) 1 MG tablet TAKE 1 TABLET BY MOUTH  EVERY MORNING   . carvedilol (COREG) 6.25 MG tablet Take 6.25 mg by mouth 2 (two) times daily with a meal.    . diazepam (VALIUM) 5 MG tablet Take 1 tablet (5 mg total) by mouth at bedtime.   Marland Kitchen  Docosanol 10 % CREA Apply 0.5 inches topically 2 (two) times a day.   . docusate sodium (COLACE) 100 MG capsule Take 100 mg by mouth daily.    . fluticasone (FLONASE) 50 MCG/ACT nasal spray Place 2 sprays into the nose 2 (two) times daily.   Marland Kitchen glucose blood (PRECISION QID TEST) test strip Use 1 each (1 strip total) 4 (four) times daily Use as instructed. OneTouch Verio   . HYDROcodone-acetaminophen (NORCO/VICODIN) 5-325 MG tablet Take 1 tablet by mouth at bedtime as needed for moderate pain.   Marland Kitchen insulin glargine (LANTUS) 100 UNIT/ML injection Inject 7 Units into the skin at bedtime.   Marland Kitchen LANTUS SOLOSTAR 100 UNIT/ML Solostar Pen    . lenalidomide (REVLIMID) 10 MG capsule TAKE 1 CAPSULE BY MOUTH  DAILY FOR 21 DAYS ON, THEN  7 DAYS OFF   . losartan (COZAAR) 100 MG tablet TAKE (1) TABLET BY MOUTH ONCE DAILY.   . magnesium oxide (MAG-OX) 400 (241.3 Mg) MG tablet Take 1 tablet (400 mg total) by mouth 3 (three) times daily.   . metFORMIN (GLUCOPHAGE) 500 MG tablet TAKE 1 TABLET BY MOUTH ONCE DAILY WITH BREAKFAST (Patient taking differently: Take 1,000 mg by mouth 2 (two) times daily with a meal. )   . omeprazole (PRILOSEC) 20 MG capsule TAKE 1 CAPSULE BY MOUTH  DAILY   . ondansetron (ZOFRAN) 8 MG tablet Take 1 tablet (8 mg total) by mouth every 8 (eight) hours as needed.   . potassium chloride SA (K-DUR,KLOR-CON) 20 MEQ tablet Take 20 mEq by mouth daily.    . pregabalin (LYRICA) 200 MG capsule TAKE (1) CAPSULE BY MOUTH TWICE DAILY.   Marland Kitchen prochlorperazine (COMPAZINE) 10 MG tablet Take 1 tablet (10 mg total)  by mouth every 6 (six) hours as needed for nausea or vomiting. 08/09/2018: Takes prior to Velcade injection  . rosuvastatin (CRESTOR) 5 MG tablet Take 5 mg by mouth daily.    . TRULICITY 6.73 AL/9.3XT SOPN Inject 0.75 mg into the skin every Monday.    . valACYclovir (VALTREX) 1000 MG tablet Take 1 tablet (1,000 mg total) by mouth daily. Take 2 tablets now and 2 tablets 12 hours later   . verapamil (CALAN-SR) 240 MG CR tablet TAKE 1 TABLET BY MOUTH AT  BEDTIME    No facility-administered encounter medications on file as of 01/08/2019.     ALLERGIES:  No Known Allergies   PHYSICAL EXAM:  ECOG Performance status: 1  Vitals:   01/08/19 1142  BP: 112/74  Pulse: 73  Resp: 18  Temp: (!) 97.4 F (36.3 C)  SpO2: 97%   Filed Weights   01/08/19 1142  Weight: 268 lb 9.6 oz (121.8 kg)    Physical Exam Vitals signs reviewed.  Constitutional:      Appearance: Normal appearance.  Cardiovascular:     Rate and Rhythm: Normal rate and regular rhythm.     Heart sounds: Normal heart sounds.  Pulmonary:     Effort: Pulmonary effort is normal.     Breath sounds: Normal breath sounds.  Abdominal:     General: There is no distension.     Palpations: Abdomen is soft. There is no mass.  Musculoskeletal:        General: No swelling.  Skin:    General: Skin is warm.  Neurological:     General: No focal deficit present.     Mental Status: She is alert and oriented to person, place, and time.  Psychiatric:  Mood and Affect: Mood normal.        Behavior: Behavior normal.      LABORATORY DATA:  I have reviewed the labs as listed.  CBC    Component Value Date/Time   WBC 2.8 (L) 01/08/2019 1120   RBC 3.26 (L) 01/08/2019 1120   HGB 10.2 (L) 01/08/2019 1120   HGB 14.2 08/02/2015 1113   HCT 30.8 (L) 01/08/2019 1120   HCT 41.1 08/02/2015 1113   PLT 165 01/08/2019 1120   PLT 371 08/02/2015 1113   MCV 94.5 01/08/2019 1120   MCV 84 08/02/2015 1113   MCH 31.3 01/08/2019 1120   MCHC  33.1 01/08/2019 1120   RDW 16.4 (H) 01/08/2019 1120   RDW 15.1 08/02/2015 1113   LYMPHSABS 0.8 01/08/2019 1120   LYMPHSABS 2.9 08/02/2015 1113   MONOABS 0.3 01/08/2019 1120   EOSABS 0.2 01/08/2019 1120   EOSABS 0.2 08/02/2015 1113   BASOSABS 0.0 01/08/2019 1120   BASOSABS 0.1 08/02/2015 1113   CMP Latest Ref Rng & Units 01/08/2019 12/25/2018 12/11/2018  Glucose 70 - 99 mg/dL 119(H) 130(H) 142(H)  BUN 6 - 20 mg/dL 15 16 18   Creatinine 0.44 - 1.00 mg/dL 1.13(H) 1.15(H) 1.21(H)  Sodium 135 - 145 mmol/L 141 141 140  Potassium 3.5 - 5.1 mmol/L 3.4(L) 3.5 2.9(L)  Chloride 98 - 111 mmol/L 102 101 104  CO2 22 - 32 mmol/L 23 24 23   Calcium 8.9 - 10.3 mg/dL 9.1 9.0 8.7(L)  Total Protein 6.5 - 8.1 g/dL 7.5 7.2 6.9  Total Bilirubin 0.3 - 1.2 mg/dL 1.1 0.7 0.8  Alkaline Phos 38 - 126 U/L 77 75 73  AST 15 - 41 U/L 36 36 24  ALT 0 - 44 U/L 29 24 19        DIAGNOSTIC IMAGING:  I have independently reviewed the scans and discussed with the patient.   I have reviewed Venita Lick LPN's note and agree with the documentation.  I personally performed a face-to-face visit, made revisions and my assessment and plan is as follows.    ASSESSMENT & PLAN:   Multiple myeloma ([place]) 1.  IgG lambda multiple myeloma, stage III, del 17p: - Presentation with lower back pain since March, presented to the emergency room on 10/25/2017 with right groin pain, CT scan showed multiple lytic lesions, multiple lytic lesions on skeletal survey - SPEP shows 1.5 g/dL of monoclonal protein, beta-2 microglobulin of 4.8, free lambda light chains of 2869, kappa by lambda ratio of less than 0, elevated LDH. 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  FISH panel shows -14, -12, 13 q.-/-13 and 17p-.  Bone marrow biopsy shows 90% plasma cells. -4 cycles of RVD from 11/01/2017 through 01/14/2018.  Labs done on 01/20/2018 showed M spike to be negative. - She was evaluated by Dr. Laverta Baltimore at [facility].  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 - Because of her high risk disease, consolidation with 2 cycles of chemotherapy was done with RVD from 06/11/2018 through 07/01/2018.  -Maintenance Velcade every 2 weeks and Revlimid 10 mg 3 weeks on 1 week off started on 07/29/2018.   - She was hospitalized with mild fever and respiratory symptoms including wheezing from 08/09/2018 through 08/11/2018.  A nasal swab apparently was positive for RSV.  - Myeloma panel at [facility] on 08/18/2018 was negative. -She is continuing Revlimid 10 mg 3 weeks on 1 week off.  She is getting  Velcade every other week. -We reviewed myeloma panel from 11/13/2018.  SPEP is negative.  Free light chain ratio has gone up to 1.98.  This was previously 0.74.  Kappa light chains at 55.5.  Lambda light chains are 28.1.  Creatinine is 1.1. -She is complaining of pain in her left lower quadrant.  She reportedly had similar pain on the right side when she had ovarian cyst problems.  We will order a transvaginal pelvic ultrasound.  2.  Bone strengthening agents: -We will continue Zometa every 3 months.  She will also continue calcium and vitamin D.  3.  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  She will continue Lyrica 200 mg twice daily.  4.  ID prophylaxis: -She will continue acyclovir twice daily.  5.  Hypomagnesemia: -She is taking 2 tablets 3 times a day of magnesium. -Magnesium is 1.3 today.  She will receive 2 g IV. -This is from a combination of Revlimid and metformin.  She gets diarrhea from metformin.  She does have about 3 watery bowel movements per day.  6.  Diabetes: -She is taking metformin thousand milligrams twice daily. -She is taking Lantus 7 units at bedtime.  Her last hemoglobin A1c was 5.7.    Total time spent is 25 minutes with more than 50% of the time spent face-to-face discussing treatment plan and  coordination of care.  Orders placed this encounter:  Orders Placed This Encounter  Procedures  . US PELVIC COMPLETE WITH TRANSVAGINAL      Sreedhar Katragadda, [place] [phone]

## 2019-01-08 NOTE — Patient Instructions (Addendum)
[facility] at [facility] Discharge Instructions  You were seen today by Dr. Katragadda. He went over your recent lab results. He will see you back in 4 weeks for labs and follow up.   Thank you for choosing [facility] at [facility] to provide your oncology and hematology care.  To afford each patient quality time with our provider, please arrive at least 15 minutes before your scheduled appointment time.   If you have a lab appointment with the Cancer Center please come in thru the  Main Entrance and check in at the main information desk  You need to re-schedule your appointment should you arrive 10 or more minutes late.  We strive to give you quality time with our providers, and arriving late affects you and other patients whose appointments are after yours.  Also, if you no show three or more times for appointments you may be dismissed from the clinic at the providers discretion.     Again, thank you for choosing [facility].  Our hope is that these requests will decrease the amount of time that you wait before being seen by our physicians.       _____________________________________________________________  Should you have questions after your visit to [facility], please contact our office at [phone] between the hours of 8:00 a.m. and 4:30 p.m.  Voicemails left after 4:00 p.m. will not be returned until the following business day.  For prescription refill requests, have your pharmacy contact our office and allow 72 hours.    Cancer Center Support Programs:   > Cancer Support Group  2nd Tuesday of the month 1pm-2pm, Journey Room    

## 2019-01-08 NOTE — Progress Notes (Signed)
01/08/19  Received order to give Magnesium Sulfate 2 gm IVPB x 1 for Mag level 1.3  Order entered.  T.O. Dr Rhys Martini, PharmD

## 2019-01-08 NOTE — Patient Instructions (Addendum)
[facility] Discharge Instructions for Patients Receiving Chemotherapy   Beginning January 23rd 2017 lab work for the [facility] will be done in the  Main lab at [facility] on 1st floor. If you have a lab appointment with the [place] please come in thru the  Main Entrance and check in at the main information desk   Today you received the following chemotherapy agents Velcade injection as well as Magnesium infusion. Follow-up as scheduled. Call clinic for any questions or concerns  To help prevent nausea and vomiting after your treatment, we encourage you to take your nausea medication   If you develop nausea and vomiting, or diarrhea that is not controlled by your medication, call the clinic.  The clinic phone number is [phone]. Office hours are Monday-Friday 8:30am-5:00pm.  BELOW ARE SYMPTOMS THAT SHOULD BE REPORTED IMMEDIATELY:  *FEVER GREATER THAN 101.0 F  *CHILLS WITH OR WITHOUT FEVER  NAUSEA AND VOMITING THAT IS NOT CONTROLLED WITH YOUR NAUSEA MEDICATION  *UNUSUAL SHORTNESS OF BREATH  *UNUSUAL BRUISING OR BLEEDING  TENDERNESS IN MOUTH AND THROAT WITH OR WITHOUT PRESENCE OF ULCERS  *URINARY PROBLEMS  *BOWEL PROBLEMS  UNUSUAL RASH Items with * indicate a potential emergency and should be followed up as soon as possible. If you have an emergency after office hours please contact your primary care physician or go to the nearest emergency department.  Please call the clinic during office hours if you have any questions or concerns.   You may also contact the Patient Navigator at [phone] should you have any questions or need assistance in obtaining follow up care.      Resources For Cancer Patients and their Caregivers ? American Cancer Society: Can assist with transportation, wigs, general needs, runs Look Good Feel Better.        782-306-7458 ? Cancer Care: Provides financial assistance, online support groups,  medication/co-pay assistance.  1-800-813-HOPE 778-204-2740) ? [place] Assists [place] cancer patients and their families through emotional , educational and financial support.  902-410-2710 ? [place] DSS Where to apply for food stamps, Medicaid and utility assistance. (437) 497-3618 ? [facility]: Transportation to medical appointments. 6690087093 ? Social Security Administration: May apply for disability if have a Stage IV cancer. 929-848-9220 410 211 2570 ? [employer], Disability and Transit Services: Assists with nutrition, care and transit needs. 669-655-8544

## 2019-01-09 LAB — KAPPA/LAMBDA LIGHT CHAINS
Kappa free light chain: 70.1 mg/L — ABNORMAL HIGH (ref 3.3–19.4)
Kappa, lambda light chain ratio: 1.74 — ABNORMAL HIGH (ref 0.26–1.65)
Lambda free light chains: 40.3 mg/L — ABNORMAL HIGH (ref 5.7–26.3)

## 2019-01-12 ENCOUNTER — Encounter: Payer: Self-pay | Admitting: Family Medicine

## 2019-01-12 ENCOUNTER — Other Ambulatory Visit: Payer: Self-pay

## 2019-01-12 ENCOUNTER — Ambulatory Visit (INDEPENDENT_AMBULATORY_CARE_PROVIDER_SITE_OTHER): Payer: BC Managed Care – PPO | Admitting: Family Medicine

## 2019-01-12 DIAGNOSIS — I10 Essential (primary) hypertension: Secondary | ICD-10-CM

## 2019-01-12 DIAGNOSIS — F5101 Primary insomnia: Secondary | ICD-10-CM

## 2019-01-12 LAB — PROTEIN ELECTROPHORESIS, SERUM
A/G Ratio: 1.2 (ref 0.7–1.7)
Albumin ELP: 3.8 g/dL (ref 2.9–4.4)
Alpha-1-Globulin: 0.2 g/dL (ref 0.0–0.4)
Alpha-2-Globulin: 0.8 g/dL (ref 0.4–1.0)
Beta Globulin: 1 g/dL (ref 0.7–1.3)
Gamma Globulin: 1.1 g/dL (ref 0.4–1.8)
Globulin, Total: 3.1 g/dL (ref 2.2–3.9)
Total Protein ELP: 6.9 g/dL (ref 6.0–8.5)

## 2019-01-12 LAB — IMMUNOFIXATION ELECTROPHORESIS
IgA: 194 mg/dL (ref 87–352)
IgG (Immunoglobin G), Serum: 1190 mg/dL (ref 586–1602)
IgM (Immunoglobulin M), Srm: 18 mg/dL — ABNORMAL LOW (ref 26–217)
Total Protein ELP: 7.1 g/dL (ref 6.0–8.5)

## 2019-01-12 MED ORDER — ALLOPURINOL 300 MG PO TABS: ORAL_TABLET | ORAL | 1 refills | Status: DC

## 2019-01-12 MED ORDER — OMEPRAZOLE 20 MG PO CPDR: 20.0000 mg | DELAYED_RELEASE_CAPSULE | Freq: Every day | ORAL | 1 refills | Status: DC

## 2019-01-12 MED ORDER — VERAPAMIL HCL ER 240 MG PO TBCR: 240.0000 mg | EXTENDED_RELEASE_TABLET | Freq: Every day | ORAL | 1 refills | Status: DC

## 2019-01-12 MED ORDER — LOSARTAN POTASSIUM 100 MG PO TABS: ORAL_TABLET | ORAL | 1 refills | Status: DC

## 2019-01-12 MED ORDER — BUMETANIDE 1 MG PO TABS: 1.0000 mg | ORAL_TABLET | Freq: Every morning | ORAL | 1 refills | Status: DC

## 2019-01-12 NOTE — Progress Notes (Signed)
   Subjective:  Audio plus video.  Patient presents with numerous concerns  Patient ID: Joanna Reid, female    DOB: [birth year], 53 y.o.   MRN: [id number]  Hypertension This is a chronic problem.  pt states she gets her bp checked every 2 weeks at cancer center and always runs in the 120's/70's.   Sees diabetic specialist. Last visit was first week of June and goes back in August.   Pt states no concerns today.    Virtual Visit via Video Note  I connected with JESTINE BICKNELL on 01/12/19 at 11:00 AM EDT by a video enabled telemedicine application and verified that I am speaking with the correct person using two identifiers.  Location: Patient: home Provider: office   I discussed the limitations of evaluation and management by telemedicine and the availability of in person appointments. The patient expressed understanding and agreed to proceed.  History of Present Illness:    Observations/Objective:   Assessment and Plan:   Follow Up Instructions:    I discussed the assessment and treatment plan with the patient. The patient was provided an opportunity to ask questions and all were answered. The patient agreed with the plan and demonstrated an understanding of the instructions.   The patient was advised to call back or seek an in-person evaluation if the symptoms worsen or if the condition fails to improve as anticipated.  I provided 25 minutes of non-face-to-face time during this encounter.    Blood pressure medicine and blood pressure levels reviewed today with patient. Compliant with blood pressure medicine. States does not miss a dose. No obvious side effects. Blood pressure generally good when checked elsewhere. Watching salt intake.   Patient compliant with insomnia medication. Generally takes most nights. No obvious morning drowsiness. Definitely helps patient sleep. Without it patient states would not get a good nights rest.  History of gout.  Was quite  challenging prior to medication.  Now uses allopurinol faithfully.  Definitely helps.  Followed by the specialist for her diabetes hyperlipidemia and multiple myeloma    Review of Systems No headache, no major weight loss or weight gain, no chest pain no back pain abdominal pain no change in bowel habits complete ROS otherwise negative     Objective:   Physical Exam   Virtual     Assessment & Plan:  Impression hypertension.  Numbers reviewed when goes to oncologist.  Generally great control.  Diet discussed compliance discussed maintain same meds  2.  Insomnia.  With definite need for medications.  Rationale discussed will refill and maintain.  3.  Gout.  Clinically stable.  Tolerates meds well to maintain renal function good  Diet exercise discussed medications refilled follow-up in 6 months

## 2019-01-14 ENCOUNTER — Ambulatory Visit (HOSPITAL_COMMUNITY)
Admission: RE | Admit: 2019-01-14 | Discharge: 2019-01-14 | Disposition: A | Discharge status: Home / Self Care | Payer: BC Managed Care – PPO | Source: Ambulatory Visit | Attending: Hematology | Admitting: Hematology

## 2019-01-14 ENCOUNTER — Other Ambulatory Visit: Payer: Self-pay

## 2019-01-14 DIAGNOSIS — R102 Pelvic and perineal pain: Secondary | ICD-10-CM | POA: Diagnosis present

## 2019-01-22 ENCOUNTER — Inpatient Hospital Stay (HOSPITAL_COMMUNITY): Payer: BC Managed Care – PPO

## 2019-01-22 ENCOUNTER — Other Ambulatory Visit: Payer: Self-pay

## 2019-01-22 VITALS — BP 134/84 | HR 72 | Temp 97.5°F | Resp 18 | Wt 276.8 lb

## 2019-01-22 DIAGNOSIS — C9000 Multiple myeloma not having achieved remission: Secondary | ICD-10-CM

## 2019-01-22 DIAGNOSIS — Z5112 Encounter for antineoplastic immunotherapy: Secondary | ICD-10-CM | POA: Diagnosis not present

## 2019-01-22 DIAGNOSIS — Z5111 Encounter for antineoplastic chemotherapy: Secondary | ICD-10-CM

## 2019-01-22 DIAGNOSIS — C9001 Multiple myeloma in remission: Secondary | ICD-10-CM

## 2019-01-22 LAB — COMPREHENSIVE METABOLIC PANEL
ALT: 30 U/L (ref 0–44)
AST: 39 U/L (ref 15–41)
Albumin: 4.1 g/dL (ref 3.5–5.0)
Alkaline Phosphatase: 75 U/L (ref 38–126)
Anion gap: 11 (ref 5–15)
BUN: 16 mg/dL (ref 6–20)
CO2: 25 mmol/L (ref 22–32)
Calcium: 9.3 mg/dL (ref 8.9–10.3)
Chloride: 104 mmol/L (ref 98–111)
Creatinine, Ser: 0.98 mg/dL (ref 0.44–1.00)
GFR calc Af Amer: >60 mL/min (ref >60)
GFR calc non Af Amer: >60 mL/min (ref >60)
Glucose, Bld: 150 mg/dL — ABNORMAL HIGH (ref 70–99)
Potassium: 3.6 mmol/L (ref 3.5–5.1)
Sodium: 140 mmol/L (ref 135–145)
Total Bilirubin: 0.7 mg/dL (ref 0.3–1.2)
Total Protein: 7.5 g/dL (ref 6.5–8.1)

## 2019-01-22 LAB — CBC WITH DIFFERENTIAL/PLATELET
Abs Immature Granulocytes: 0.01 10*3/uL (ref 0.00–0.07)
Basophils Absolute: 0.1 10*3/uL (ref 0.0–0.1)
Basophils Relative: 2 %
Eosinophils Absolute: 0.1 10*3/uL (ref 0.0–0.5)
Eosinophils Relative: 4 %
HCT: 32.5 % — ABNORMAL LOW (ref 36.0–46.0)
Hemoglobin: 10.5 g/dL — ABNORMAL LOW (ref 12.0–15.0)
Immature Granulocytes: 0 %
Lymphocytes Relative: 25 %
Lymphs Abs: 0.6 10*3/uL — ABNORMAL LOW (ref 0.7–4.0)
MCH: 31 pg (ref 26.0–34.0)
MCHC: 32.3 g/dL (ref 30.0–36.0)
MCV: 95.9 fL (ref 80.0–100.0)
Monocytes Absolute: 0.2 10*3/uL (ref 0.1–1.0)
Monocytes Relative: 7 %
Neutro Abs: 1.6 10*3/uL — ABNORMAL LOW (ref 1.7–7.7)
Neutrophils Relative %: 62 %
Platelets: 204 10*3/uL (ref 150–400)
RBC: 3.39 MIL/uL — ABNORMAL LOW (ref 3.87–5.11)
RDW: 16.3 % — ABNORMAL HIGH (ref 11.5–15.5)
WBC: 2.6 10*3/uL — ABNORMAL LOW (ref 4.0–10.5)
nRBC: 0 % (ref 0.0–0.2)

## 2019-01-22 MED ORDER — BORTEZOMIB CHEMO SQ INJECTION 3.5 MG (2.5MG/ML)
1.3000 mg/m2 | Freq: Once | INTRAMUSCULAR | Status: AC
  Administered 2019-01-22: 3 mg via SUBCUTANEOUS
  Filled 2019-01-22: qty 1.2

## 2019-01-22 MED ORDER — PROCHLORPERAZINE MALEATE 10 MG PO TABS: 10.0000 mg | ORAL_TABLET | Freq: Once | ORAL | Status: DC

## 2019-01-22 NOTE — Patient Instructions (Signed)
[facility] Discharge Instructions for Patients Receiving Chemotherapy   Beginning January 23rd 2017 lab work for the Cancer Center will be done in the  Main lab at [place] on 1st floor. If you have a lab appointment with the Cancer Center please come in thru the  Main Entrance and check in at the main information desk   Today you received the following chemotherapy agents Velcade  To help prevent nausea and vomiting after your treatment, we encourage you to take your nausea medication    If you develop nausea and vomiting, or diarrhea that is not controlled by your medication, call the clinic.  The clinic phone number is [phone]. Office hours are Monday-Friday 8:30am-5:00pm.  BELOW ARE SYMPTOMS THAT SHOULD BE REPORTED IMMEDIATELY:  *FEVER GREATER THAN 101.0 F  *CHILLS WITH OR WITHOUT FEVER  NAUSEA AND VOMITING THAT IS NOT CONTROLLED WITH YOUR NAUSEA MEDICATION  *UNUSUAL SHORTNESS OF BREATH  *UNUSUAL BRUISING OR BLEEDING  TENDERNESS IN MOUTH AND THROAT WITH OR WITHOUT PRESENCE OF ULCERS  *URINARY PROBLEMS  *BOWEL PROBLEMS  UNUSUAL RASH Items with * indicate a potential emergency and should be followed up as soon as possible. If you have an emergency after office hours please contact your primary care physician or go to the nearest emergency department.  Please call the clinic during office hours if you have any questions or concerns.   You may also contact the Patient Navigator at [phone] should you have any questions or need assistance in obtaining follow up care.      Resources For Cancer Patients and their Caregivers ? American Cancer Society: Can assist with transportation, wigs, general needs, runs Look Good Feel Better.        1-888-227-6333 ? Cancer Care: Provides financial assistance, online support groups, medication/co-pay assistance.  1-800-813-HOPE (4673) ? [facility] Assists [place] cancer  patients and their families through emotional , educational and financial support.  336-427-4357 ? [place] DSS Where to apply for food stamps, Medicaid and utility assistance. 336-342-1394 ? [facility]: Transportation to medical appointments. 336-347-2287 ? Social Security Administration: May apply for disability if have a Stage IV cancer. 336-342-7796 1-800-772-1213 ? [place] Aging, Disability and Transit Services: Assists with nutrition, care and transit needs. 336-349-2343          

## 2019-01-22 NOTE — Progress Notes (Signed)
Joanna Reid presents today for Velcade injection. Lab work reviewed prior to administration. Pt tolerated injection into lower abdomen without incident or complaint. VSS. Discharged self ambulatory in satisfactory condition.

## 2019-01-28 ENCOUNTER — Telehealth: Payer: Self-pay | Admitting: Family Medicine

## 2019-01-28 NOTE — Telephone Encounter (Signed)
Your nswer is right we do not have immuniz for adults, I am surprised that [facility] with all their resources does not provide this

## 2019-01-28 NOTE — Telephone Encounter (Signed)
Raquel Sarna returned my call. She has faxed over the immunization schedule (schedule placed in box). Raquel Sarna also states that if we were unable to provide these she could have them done at [facility] [facility] was stating that it may save patient a long trip. Please advise. Thank you

## 2019-01-28 NOTE — Telephone Encounter (Signed)
thx answer still the same insur co do not authorized prim care tio administer these at this age

## 2019-01-28 NOTE — Telephone Encounter (Signed)
Pt contacted office. 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 Pt states that the bone marrow transplant wiped out all of her immunizations. Pt states that Raquel Sarna from [place] [facility] called her today and states that she is needing to have all immunizations (from childhood on)done again. Informed patient that she would need [facility] Raquel Sarna is the Transplant Coordinator for Dr.Long at [facility]. I have call Raquel Sarna to get more information but had to leave a voicemail. Please advise. Thank you   Raquel Sarna at [phone]

## 2019-01-30 ENCOUNTER — Other Ambulatory Visit (HOSPITAL_COMMUNITY): Payer: Self-pay | Admitting: Hematology

## 2019-01-30 DIAGNOSIS — C9000 Multiple myeloma not having achieved remission: Secondary | ICD-10-CM

## 2019-02-05 ENCOUNTER — Other Ambulatory Visit: Payer: Self-pay

## 2019-02-05 ENCOUNTER — Inpatient Hospital Stay (HOSPITAL_COMMUNITY): Payer: BC Managed Care – PPO

## 2019-02-05 ENCOUNTER — Inpatient Hospital Stay (HOSPITAL_COMMUNITY): Payer: BC Managed Care – PPO | Admitting: Hematology

## 2019-02-05 ENCOUNTER — Encounter (HOSPITAL_COMMUNITY): Payer: Self-pay | Admitting: Hematology

## 2019-02-05 DIAGNOSIS — G620 Drug-induced polyneuropathy: Secondary | ICD-10-CM | POA: Diagnosis not present

## 2019-02-05 DIAGNOSIS — C9000 Multiple myeloma not having achieved remission: Secondary | ICD-10-CM | POA: Diagnosis not present

## 2019-02-05 DIAGNOSIS — Z79899 Other long term (current) drug therapy: Secondary | ICD-10-CM

## 2019-02-05 DIAGNOSIS — Z5111 Encounter for antineoplastic chemotherapy: Secondary | ICD-10-CM

## 2019-02-05 DIAGNOSIS — Z9484 Stem cells transplant status: Secondary | ICD-10-CM

## 2019-02-05 DIAGNOSIS — C9001 Multiple myeloma in remission: Secondary | ICD-10-CM

## 2019-02-05 DIAGNOSIS — Z5112 Encounter for antineoplastic immunotherapy: Secondary | ICD-10-CM | POA: Diagnosis not present

## 2019-02-05 DIAGNOSIS — E119 Type 2 diabetes mellitus without complications: Secondary | ICD-10-CM

## 2019-02-05 LAB — CBC WITH DIFFERENTIAL/PLATELET
Abs Immature Granulocytes: 0.01 10*3/uL (ref 0.00–0.07)
Basophils Absolute: 0 10*3/uL (ref 0.0–0.1)
Basophils Relative: 1 %
Eosinophils Absolute: 0.1 10*3/uL (ref 0.0–0.5)
Eosinophils Relative: 6 %
HCT: 31 % — ABNORMAL LOW (ref 36.0–46.0)
Hemoglobin: 10.2 g/dL — ABNORMAL LOW (ref 12.0–15.0)
Immature Granulocytes: 0 %
Lymphocytes Relative: 32 %
Lymphs Abs: 0.8 10*3/uL (ref 0.7–4.0)
MCH: 31.3 pg (ref 26.0–34.0)
MCHC: 32.9 g/dL (ref 30.0–36.0)
MCV: 95.1 fL (ref 80.0–100.0)
Monocytes Absolute: 0.3 10*3/uL (ref 0.1–1.0)
Monocytes Relative: 12 %
Neutro Abs: 1.2 10*3/uL — ABNORMAL LOW (ref 1.7–7.7)
Neutrophils Relative %: 49 %
Platelets: 184 10*3/uL (ref 150–400)
RBC: 3.26 MIL/uL — ABNORMAL LOW (ref 3.87–5.11)
RDW: 15.8 % — ABNORMAL HIGH (ref 11.5–15.5)
WBC: 2.4 10*3/uL — ABNORMAL LOW (ref 4.0–10.5)
nRBC: 0 % (ref 0.0–0.2)

## 2019-02-05 LAB — COMPREHENSIVE METABOLIC PANEL
ALT: 27 U/L (ref 0–44)
AST: 37 U/L (ref 15–41)
Albumin: 4 g/dL (ref 3.5–5.0)
Alkaline Phosphatase: 75 U/L (ref 38–126)
Anion gap: 11 (ref 5–15)
BUN: 21 mg/dL — ABNORMAL HIGH (ref 6–20)
CO2: 25 mmol/L (ref 22–32)
Calcium: 8.9 mg/dL (ref 8.9–10.3)
Chloride: 104 mmol/L (ref 98–111)
Creatinine, Ser: 1.07 mg/dL — ABNORMAL HIGH (ref 0.44–1.00)
GFR calc Af Amer: >60 mL/min (ref >60)
GFR calc non Af Amer: 59 mL/min — ABNORMAL LOW (ref >60)
Glucose, Bld: 116 mg/dL — ABNORMAL HIGH (ref 70–99)
Potassium: 3 mmol/L — ABNORMAL LOW (ref 3.5–5.1)
Sodium: 140 mmol/L (ref 135–145)
Total Bilirubin: 0.8 mg/dL (ref 0.3–1.2)
Total Protein: 7.5 g/dL (ref 6.5–8.1)

## 2019-02-05 LAB — MAGNESIUM: Magnesium: 1.4 mg/dL — ABNORMAL LOW (ref 1.7–2.4)

## 2019-02-05 MED ORDER — BORTEZOMIB CHEMO SQ INJECTION 3.5 MG (2.5MG/ML)
1.3000 mg/m2 | Freq: Once | INTRAMUSCULAR | Status: AC
  Administered 2019-02-05: 3 mg via SUBCUTANEOUS
  Filled 2019-02-05: qty 1.2

## 2019-02-05 MED ORDER — PROCHLORPERAZINE MALEATE 10 MG PO TABS
10.0000 mg | ORAL_TABLET | Freq: Once | ORAL | Status: DC
  Filled 2019-02-05: qty 1

## 2019-02-05 NOTE — Progress Notes (Signed)
ANC 1.2 today.  Patient seen by Dr. Delton Coombes with lab review and ok to treat verbal order.   Patient tolerated injection with no complaints voiced.  Site clean and dry with no bruising or swelling noted at site.  Band aid applied.  Vss with discharge and left ambulatory with no s/s of distress noted.

## 2019-02-05 NOTE — Assessment & Plan Note (Addendum)
1.  IgG lambda multiple myeloma, stage III, del 17p: - Presentation with lower back pain since March, presented to the emergency room on 10/25/2017 with right groin pain, CT scan showed multiple lytic lesions, multiple lytic lesions on skeletal survey - SPEP shows 1.5 g/dL of monoclonal protein, beta-2 microglobulin of 4.8, free lambda light chains of 2869, kappa by lambda ratio of less than 0, elevated LDH. 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  FISH panel shows -14, -12, 13 q.-/-13 and 17p-.  Bone marrow biopsy shows 90% plasma cells. -4 cycles of RVD from 11/01/2017 through 01/14/2018.  Labs done on 01/20/2018 showed M spike to be negative. - She was evaluated by Dr. Laverta Baltimore at [facility].  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 - Because of her high risk disease, consolidation with 2 cycles of chemotherapy was done with RVD from 06/11/2018 through 07/01/2018.  -Maintenance Velcade every 2 weeks and Revlimid 10 mg 3 weeks on 1 week off started on 07/29/2018.   - Myeloma panel from 01/08/2019 shows no M spike.  Immunofixation was normal.  Free light chain ratio was 1.74, kappa free light chains elevated at 70.1 and lambda light chains elevated at 40.3.  LDH was normal. -She is continuing to tolerate Revlimid and Velcade very well.  She will start her next cycle on 02/11/2019. -She will have special labs drawn for [place] on 02/09/2019 and has a virtual visit on 02/18/2019. -We will see her back in 4 weeks for follow-up.  We will try to obtain immunization schedule and see if we can give it in our office.  2.  Bone strengthening agents: -We will continue Zometa every 3 months.  Calcium is normal.  Mildly elevated creatinine is stable at 1.13.  3.  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  She will continue Lyrica 200 mg twice daily.  4.  ID prophylaxis: -She will continue  acyclovir twice daily.  5.  Hypomagnesemia: -She is taking 2 tablets 3 times a day. -Magnesium is 1.4 today.  Last magnesium on 01/08/2019 was 1.3.  She is trying dietary changes and has seen a nutritionist and 18 for optimal magnesium management. -This is from a combination of Revlimid and metformin.  She gets diarrhea from metformin.  She does have about 3 watery bowel movements per day.  6.  Diabetes: -She will continue metformin 1000 mg twice daily. -Last HbA1c was 5.7.  She is taking Lantus 7 units at bedtime and Trulicity every week. -She has a follow-up appointment with endocrinologist at [facility] on 03/02/2019.

## 2019-02-05 NOTE — Patient Instructions (Signed)
[place] at [facility] Discharge Instructions  You were seen today by Dr. Delton Coombes. He talked with you about your lab work.  Your kappa / lambda light chains were slightly elevated.  You have elevated kappa light chains but your myeloma was lambda myeloma so this elevation is not of concern at this time.  Your kidney functions is slightly elevated which could be the cause of the kappa light chains elevation.  Your magnesium is slightly better today than before.  Your other labs are unremarkable. You will get treatment today.       Thank you for choosing [place] at [facility] to provide your oncology and hematology care.  To afford each patient quality time with our provider, please arrive at least 15 minutes before your scheduled appointment time.   If you have a lab appointment with the [place] please come in thru the  Main Entrance and check in at the main information desk  You need to re-schedule your appointment should you arrive 10 or more minutes late.  We strive to give you quality time with our providers, and arriving late affects you and other patients whose appointments are after yours.  Also, if you no show three or more times for appointments you may be dismissed from the clinic at the providers discretion.     Again, thank you for choosing [facility].  Our hope is that these requests will decrease the amount of time that you wait before being seen by our physicians.       _____________________________________________________________  Should you have questions after your visit to [facility], please contact our office at [phone] between the hours of 8:00 a.m. and 4:30 p.m.  Voicemails left after 4:00 p.m. will not be returned until the following business day.  For prescription refill requests, have your pharmacy contact our office and allow 72 hours.    Cancer Center Support Programs:   >  Cancer Support Group  2nd Tuesday of the month 1pm-2pm, Journey Room

## 2019-02-05 NOTE — Progress Notes (Signed)
[place], [place] [address]   CLINIC:  Medical Oncology/Hematology  PCP:  Mikey Kirschner, MD [address] [place] [address] [phone]   REASON FOR VISIT:  Follow-up for multiple myeloma    BRIEF ONCOLOGIC HISTORY:  Oncology History  Multiple myeloma ([place])  10/29/2017 Initial Diagnosis   Multiple myeloma ([place])   11/01/2017 - 01/24/2018 Chemotherapy   The patient had bortezomib SQ (VELCADE) chemo injection 3 mg, 1.3 mg/m2 = 3 mg, Subcutaneous,  Once, 5 of 5 cycles Administration: 3 mg (11/01/2017), 3 mg (11/08/2017), 3 mg (11/05/2017), 3 mg (11/22/2017), 3 mg (11/12/2017), 3 mg (11/29/2017), 3 mg (11/26/2017), 3 mg (12/03/2017), 3 mg (12/13/2017), 3 mg (12/20/2017), 3 mg (12/17/2017), 3 mg (12/24/2017), 3 mg (01/03/2018), 3 mg (01/10/2018), 3 mg (01/07/2018), 3 mg (01/14/2018), 3 mg (01/24/2018)  for chemotherapy treatment.    06/11/2018 -  Chemotherapy   The patient had bortezomib SQ (VELCADE) chemo injection 3 mg, 1.3 mg/m2 = 3 mg, Subcutaneous,  Once, 19 of 29 cycles Administration: 3 mg (06/11/2018), 3 mg (07/29/2018), 3 mg (08/21/2018), 3 mg (09/04/2018), 3 mg (09/18/2018), 3 mg (10/02/2018), 3 mg (10/16/2018), 3 mg (10/29/2018), 3 mg (11/13/2018), 3 mg (06/18/2018), 3 mg (06/25/2018), 3 mg (07/01/2018), 3 mg (07/08/2018), 3 mg (07/15/2018), 3 mg (11/27/2018), 3 mg (12/11/2018), 3 mg (12/25/2018), 3 mg (01/08/2019), 3 mg (01/22/2019)  for chemotherapy treatment.       CANCER STAGING: Cancer Staging Multiple myeloma ([place]) Staging form: Plasma Cell Myeloma and Plasma Cell Disorders, AJCC 8th Edition - Clinical: No stage assigned - Unsigned    INTERVAL HISTORY:  Ms. Ketron 53 y.o. female seen for follow-up of multiple myeloma.  She finished taking Revlimid on 02/03/2019.  Neuropathy in the feet has been stable.  She is taking Lyrica twice daily.  She is also tolerating Velcade every 2 weeks very well.  No worsening of neuropathy.  She has seen a nutritionist  in 8 to 9 days changing her diet to improve as magnesium intake.  She is currently taking magnesium 2 tablets 3 times a day and it causes diarrhea.  She is continuing acyclovir twice daily.  Denies any fevers, night sweats or weight loss.  She does report diarrhea which is stable.  Appetite is 100%.  Energy levels are 75%.    REVIEW OF SYSTEMS:  Review of Systems  Gastrointestinal: Positive for diarrhea.  All other systems reviewed and are negative.    PAST MEDICAL/SURGICAL HISTORY:  Past Medical History:  Diagnosis Date   Acid reflux    Allergic rhinitis    Cancer (HCC)    multiple myeloma   Diabetes mellitus    type 2   Gout    Gout    HBP (high blood pressure)    History of kidney stones    Migraines    Past Surgical History:  Procedure Laterality Date   CESAREAN SECTION     EXTRACORPOREAL SHOCK WAVE LITHOTRIPSY Left 10/10/2017   Procedure: LEFT EXTRACORPOREAL SHOCK WAVE LITHOTRIPSY (ESWL);  Surgeon: Bjorn Loser, MD;  Location: [facility] ORS;  Service: Urology;  Laterality: Left;   EYE SURGERY     HEMORRHOID SURGERY N/A 11/19/2012   Procedure: HEMORRHOIDECTOMY;  Surgeon: Jamesetta So, MD;  Location: [facility] ORS;  Service: General;  Laterality: N/A;   kidney stones  1998   LAPAROSCOPIC UNILATERAL SALPINGO OOPHERECTOMY  05/14/2012   Procedure: LAPAROSCOPIC UNILATERAL SALPINGO OOPHORECTOMY;  Surgeon: [name], MD;  Location: [facility]  ORS;  Service: Gynecology;  Laterality: Right;  laparoscopic right salpingo-oophorectomy   PARTIAL HYSTERECTOMY     TONSILECTOMY, ADENOIDECTOMY, BILATERAL MYRINGOTOMY AND TUBES     VESICOVAGINAL FISTULA CLOSURE W/ TAH       SOCIAL HISTORY:  Social History   Socioeconomic History   Marital status: Married    Spouse name: Not on file   Number of children: Not on file   Years of education: 12   Highest education level: Not on file  Occupational History    Employer: [employer]  Social Needs   Financial resource strain: Not on  file   Food insecurity    Worry: Never true    Inability: Never true   Transportation needs    Medical: Not on file    Non-medical: Not on file  Tobacco Use   Smoking status: Former Smoker    Quit date: 02/27/1999    Years since quitting: 19.9   Smokeless tobacco: Never Used   Tobacco comment: socially   Substance and Sexual Activity   Alcohol use: No    Alcohol/week: 0.0 standard drinks   Drug use: No   Sexual activity: Yes  Lifestyle   Physical activity    Days per week: Not on file    Minutes per session: Not on file   Stress: Not on file  Relationships   Social connections    Talks on phone: Not on file    Gets together: Not on file    Attends religious service: Not on file    Active member of club or organization: Not on file    Attends meetings of clubs or organizations: Not on file    Relationship status: Not on file   Intimate partner violence    Fear of current or ex partner: Not on file    Emotionally abused: Not on file    Physically abused: Not on file    Forced sexual activity: Not on file  Other Topics Concern   Not on file  Social History Narrative   Not on file    FAMILY HISTORY:  Family History  Problem Relation Age of Onset   Arthritis Other    Cancer Other    Diabetes Other    Hypertension Mother    Dementia Mother    Diabetes Father    ALS Father    Diabetes Brother    Hypertension Brother    Cancer Paternal Aunt    COPD Maternal Grandmother    Cancer Maternal Grandfather    Anesthesia problems Paternal Grandfather     CURRENT MEDICATIONS:  Outpatient Encounter Medications as of 02/05/2019  Medication Sig Note   acyclovir (ZOVIRAX) 800 MG tablet Take 800 mg by mouth 2 (two) times daily.     albuterol (PROVENTIL HFA;VENTOLIN HFA) 108 (90 Base) MCG/ACT inhaler Inhale 2 puffs into the lungs every 4 (four) hours as needed for wheezing or shortness of breath.    allopurinol (ZYLOPRIM) 300 MG tablet Take one  daily    aspirin EC 81 MG tablet Take 81 mg by mouth daily.    Bortezomib (VELCADE IJ) Inject as directed every 14 (fourteen) days.  08/09/2018: Due for next injection on 08/13/2018   bumetanide (BUMEX) 1 MG tablet Take 1 tablet (1 mg total) by mouth every morning.    carvedilol (COREG) 6.25 MG tablet Take 6.25 mg by mouth 2 (two) times daily with a meal.     diazepam (VALIUM) 5 MG tablet Take 1 tablet (5 mg total) by  mouth at bedtime.    Docosanol 10 % CREA Apply 0.5 inches topically 2 (two) times a day.    docusate sodium (COLACE) 100 MG capsule Take 100 mg by mouth daily.     fluticasone (FLONASE) 50 MCG/ACT nasal spray Place 2 sprays into the nose 2 (two) times daily.    glucose blood (PRECISION QID TEST) test strip Use 1 each (1 strip total) 4 (four) times daily Use as instructed. OneTouch Verio    HYDROcodone-acetaminophen (NORCO/VICODIN) 5-325 MG tablet Take 1 tablet by mouth at bedtime as needed for moderate pain.    insulin glargine (LANTUS) 100 UNIT/ML injection Inject 7 Units into the skin at bedtime.    LANTUS SOLOSTAR 100 UNIT/ML Solostar Pen     lenalidomide (REVLIMID) 10 MG capsule TAKE 1 CAPSULE BY MOUTH  DAILY FOR 21 DAYS ON, THEN  7 DAYS OFF    losartan (COZAAR) 100 MG tablet TAKE (1) TABLET BY MOUTH ONCE DAILY.    magnesium oxide (MAG-OX) 400 (241.3 Mg) MG tablet Take 1 tablet (400 mg total) by mouth 3 (three) times daily.    metFORMIN (GLUCOPHAGE) 500 MG tablet TAKE 1 TABLET BY MOUTH ONCE DAILY WITH BREAKFAST (Patient taking differently: Take 1,000 mg by mouth 2 (two) times daily with a meal. )    omeprazole (PRILOSEC) 20 MG capsule Take 1 capsule (20 mg total) by mouth daily.    ondansetron (ZOFRAN) 8 MG tablet Take 1 tablet (8 mg total) by mouth every 8 (eight) hours as needed.    potassium chloride SA (K-DUR,KLOR-CON) 20 MEQ tablet Take 20 mEq by mouth daily.     pregabalin (LYRICA) 200 MG capsule TAKE (1) CAPSULE BY MOUTH TWICE DAILY.     prochlorperazine (COMPAZINE) 10 MG tablet Take 1 tablet (10 mg total) by mouth every 6 (six) hours as needed for nausea or vomiting. 08/09/2018: Takes prior to Velcade injection   rosuvastatin (CRESTOR) 5 MG tablet Take 5 mg by mouth daily.     TRULICITY 0.08 QP/6.1PJ SOPN Inject 0.75 mg into the skin every Monday.     valACYclovir (VALTREX) 1000 MG tablet Take 1 tablet (1,000 mg total) by mouth daily. Take 2 tablets now and 2 tablets 12 hours later    verapamil (CALAN-SR) 240 MG CR tablet Take 1 tablet (240 mg total) by mouth at bedtime.    No facility-administered encounter medications on file as of 02/05/2019.     ALLERGIES:  No Known Allergies   PHYSICAL EXAM:  ECOG Performance status: 1  Vitals:   02/05/19 1515  BP: 129/86  Pulse: 77  Resp: 18  Temp: 97.9 F (36.6 C)  SpO2: 98%   Filed Weights   02/05/19 1515  Weight: 272 lb 1.6 oz (123.4 kg)    Physical Exam Vitals signs reviewed.  Constitutional:      Appearance: Normal appearance.  Cardiovascular:     Rate and Rhythm: Normal rate and regular rhythm.     Heart sounds: Normal heart sounds.  Pulmonary:     Effort: Pulmonary effort is normal.     Breath sounds: Normal breath sounds.  Abdominal:     General: There is no distension.     Palpations: Abdomen is soft. There is no mass.  Musculoskeletal:        General: No swelling.  Skin:    General: Skin is warm.  Neurological:     General: No focal deficit present.     Mental Status: She is alert and oriented to person,  place, and time.  Psychiatric:        Mood and Affect: Mood normal.        Behavior: Behavior normal.      LABORATORY DATA:  I have reviewed the labs as listed.  CBC    Component Value Date/Time   WBC 2.4 (L) 02/05/2019 1434   RBC 3.26 (L) 02/05/2019 1434   HGB 10.2 (L) 02/05/2019 1434   HGB 14.2 08/02/2015 1113   HCT 31.0 (L) 02/05/2019 1434   HCT 41.1 08/02/2015 1113   PLT 184 02/05/2019 1434   PLT 371 08/02/2015 1113   MCV  95.1 02/05/2019 1434   MCV 84 08/02/2015 1113   MCH 31.3 02/05/2019 1434   MCHC 32.9 02/05/2019 1434   RDW 15.8 (H) 02/05/2019 1434   RDW 15.1 08/02/2015 1113   LYMPHSABS 0.8 02/05/2019 1434   LYMPHSABS 2.9 08/02/2015 1113   MONOABS 0.3 02/05/2019 1434   EOSABS 0.1 02/05/2019 1434   EOSABS 0.2 08/02/2015 1113   BASOSABS 0.0 02/05/2019 1434   BASOSABS 0.1 08/02/2015 1113   CMP Latest Ref Rng & Units 01/22/2019 01/08/2019 12/25/2018  Glucose 70 - 99 mg/dL 150(H) 119(H) 130(H)  BUN 6 - 20 mg/dL 16 15 16   Creatinine 0.44 - 1.00 mg/dL 0.98 1.13(H) 1.15(H)  Sodium 135 - 145 mmol/L 140 141 141  Potassium 3.5 - 5.1 mmol/L 3.6 3.4(L) 3.5  Chloride 98 - 111 mmol/L 104 102 101  CO2 22 - 32 mmol/L 25 23 24   Calcium 8.9 - 10.3 mg/dL 9.3 9.1 9.0  Total Protein 6.5 - 8.1 g/dL 7.5 7.5 7.2  Total Bilirubin 0.3 - 1.2 mg/dL 0.7 1.1 0.7  Alkaline Phos 38 - 126 U/L 75 77 75  AST 15 - 41 U/L 39 36 36  ALT 0 - 44 U/L 30 29 24        DIAGNOSTIC IMAGING:  I have independently reviewed the scans and discussed with the patient.   I have reviewed Venita Lick LPN's note and agree with the documentation.  I personally performed a face-to-face visit, made revisions and my assessment and plan is as follows.    ASSESSMENT & PLAN:   Multiple myeloma ([place]) 1.  IgG lambda multiple myeloma, stage III, del 17p: - Presentation with lower back pain since March, presented to the emergency room on 10/25/2017 with right groin pain, CT scan showed multiple lytic lesions, multiple lytic lesions on skeletal survey - SPEP shows 1.5 g/dL of monoclonal protein, beta-2 microglobulin of 4.8, free lambda light chains of 2869, kappa by lambda ratio of less than 0, elevated LDH. 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  FISH panel shows -14, -12, 13 q.-/-13 and 17p-.  Bone marrow biopsy shows 90% plasma cells. -4 cycles of RVD from 11/01/2017 through 01/14/2018.  Labs done on 01/20/2018 showed M spike to be  negative. - She was evaluated by Dr. Laverta Baltimore at [facility].  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 - Because of her high risk disease, consolidation with 2 cycles of chemotherapy was done with RVD from 06/11/2018 through 07/01/2018.  -Maintenance Velcade every 2 weeks and Revlimid 10 mg 3 weeks on 1 week off started on 07/29/2018.   - Myeloma panel from 01/08/2019 shows no M spike.  Immunofixation was normal.  Free light chain ratio was 1.74, kappa free light chains elevated at 70.1 and lambda light chains elevated at 40.3.  LDH was normal.  2.  Bone  strengthening agents: -We will continue Zometa every 3 months.  Calcium is normal.  Mildly elevated creatinine is stable at 1.13.  3.  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  She will continue Lyrica 200 mg twice daily.  4.  ID prophylaxis: -She will continue acyclovir twice daily.  5.  Hypomagnesemia: -She is taking 2 tablets 3 times a day. -Magnesium today is pending.  Last magnesium on 01/08/2019 was 1.3. -This is from a combination of Revlimid and metformin.  She gets diarrhea from metformin.  She does have about 3 watery bowel movements per day.  6.  Diabetes: -She is taking metformin thousand milligrams twice daily. -She is taking Lantus 7 units at bedtime.  Her last hemoglobin A1c was 5.7.    Total time spent is 25 minutes with more than 50% of the time spent face-to-face discussing treatment plan and coordination of care.  Orders placed this encounter:  No orders of the defined types were placed in this encounter.     Derek Jack, MD [place] [phone]

## 2019-02-09 ENCOUNTER — Ambulatory Visit (HOSPITAL_COMMUNITY): Payer: BC Managed Care – PPO

## 2019-02-10 ENCOUNTER — Telehealth (HOSPITAL_COMMUNITY): Payer: Self-pay | Admitting: Hematology

## 2019-02-10 NOTE — Telephone Encounter (Signed)
PT APPROVED FOR VELCADE REIMBURSEMENT 11/14/18-11/13/19 ID# [id number]  Submitted for reimbursement for [facility] 5/7*5/21*6/4 and 6/18

## 2019-02-11 ENCOUNTER — Telehealth (HOSPITAL_COMMUNITY): Payer: Self-pay | Admitting: Hematology

## 2019-02-11 NOTE — Telephone Encounter (Signed)
[facility] 4 REPLACEMENT VIALS OF  VELCADE FOR DOS 5/7*5/21*6/4 AND 6/18.

## 2019-02-19 ENCOUNTER — Inpatient Hospital Stay (HOSPITAL_COMMUNITY): Payer: BC Managed Care – PPO

## 2019-02-19 ENCOUNTER — Other Ambulatory Visit: Payer: Self-pay

## 2019-02-19 ENCOUNTER — Inpatient Hospital Stay (HOSPITAL_COMMUNITY): Payer: BC Managed Care – PPO | Attending: Hematology

## 2019-02-19 VITALS — BP 121/75 | HR 74 | Temp 97.6°F | Resp 18 | Wt 271.0 lb

## 2019-02-19 DIAGNOSIS — G629 Polyneuropathy, unspecified: Secondary | ICD-10-CM | POA: Diagnosis not present

## 2019-02-19 DIAGNOSIS — C9000 Multiple myeloma not having achieved remission: Secondary | ICD-10-CM | POA: Diagnosis not present

## 2019-02-19 DIAGNOSIS — Z9484 Stem cells transplant status: Secondary | ICD-10-CM | POA: Diagnosis not present

## 2019-02-19 DIAGNOSIS — C9001 Multiple myeloma in remission: Secondary | ICD-10-CM

## 2019-02-19 DIAGNOSIS — E119 Type 2 diabetes mellitus without complications: Secondary | ICD-10-CM | POA: Insufficient documentation

## 2019-02-19 DIAGNOSIS — Z79899 Other long term (current) drug therapy: Secondary | ICD-10-CM | POA: Diagnosis not present

## 2019-02-19 DIAGNOSIS — Z7984 Long term (current) use of oral hypoglycemic drugs: Secondary | ICD-10-CM | POA: Diagnosis not present

## 2019-02-19 DIAGNOSIS — Z5112 Encounter for antineoplastic immunotherapy: Secondary | ICD-10-CM | POA: Diagnosis present

## 2019-02-19 DIAGNOSIS — Z23 Encounter for immunization: Secondary | ICD-10-CM | POA: Insufficient documentation

## 2019-02-19 DIAGNOSIS — Z5111 Encounter for antineoplastic chemotherapy: Secondary | ICD-10-CM

## 2019-02-19 LAB — COMPREHENSIVE METABOLIC PANEL
ALT: 28 U/L (ref 0–44)
AST: 35 U/L (ref 15–41)
Albumin: 4.2 g/dL (ref 3.5–5.0)
Alkaline Phosphatase: 72 U/L (ref 38–126)
Anion gap: 11 (ref 5–15)
BUN: 28 mg/dL — ABNORMAL HIGH (ref 6–20)
CO2: 23 mmol/L (ref 22–32)
Calcium: 9.1 mg/dL (ref 8.9–10.3)
Chloride: 105 mmol/L (ref 98–111)
Creatinine, Ser: 1.15 mg/dL — ABNORMAL HIGH (ref 0.44–1.00)
GFR calc Af Amer: >60 mL/min (ref >60)
GFR calc non Af Amer: 54 mL/min — ABNORMAL LOW (ref >60)
Glucose, Bld: 114 mg/dL — ABNORMAL HIGH (ref 70–99)
Potassium: 3.7 mmol/L (ref 3.5–5.1)
Sodium: 139 mmol/L (ref 135–145)
Total Bilirubin: 0.6 mg/dL (ref 0.3–1.2)
Total Protein: 7.8 g/dL (ref 6.5–8.1)

## 2019-02-19 LAB — CBC WITH DIFFERENTIAL/PLATELET
Abs Immature Granulocytes: 0.01 10*3/uL (ref 0.00–0.07)
Basophils Absolute: 0 10*3/uL (ref 0.0–0.1)
Basophils Relative: 1 %
Eosinophils Absolute: 0.1 10*3/uL (ref 0.0–0.5)
Eosinophils Relative: 2 %
HCT: 32.9 % — ABNORMAL LOW (ref 36.0–46.0)
Hemoglobin: 10.6 g/dL — ABNORMAL LOW (ref 12.0–15.0)
Immature Granulocytes: 0 %
Lymphocytes Relative: 27 %
Lymphs Abs: 1 10*3/uL (ref 0.7–4.0)
MCH: 30.8 pg (ref 26.0–34.0)
MCHC: 32.2 g/dL (ref 30.0–36.0)
MCV: 95.6 fL (ref 80.0–100.0)
Monocytes Absolute: 0.2 10*3/uL (ref 0.1–1.0)
Monocytes Relative: 6 %
Neutro Abs: 2.4 10*3/uL (ref 1.7–7.7)
Neutrophils Relative %: 64 %
Platelets: 232 10*3/uL (ref 150–400)
RBC: 3.44 MIL/uL — ABNORMAL LOW (ref 3.87–5.11)
RDW: 15.4 % (ref 11.5–15.5)
WBC: 3.8 10*3/uL — ABNORMAL LOW (ref 4.0–10.5)
nRBC: 0 % (ref 0.0–0.2)

## 2019-02-19 LAB — MAGNESIUM: Magnesium: 1.4 mg/dL — ABNORMAL LOW (ref 1.7–2.4)

## 2019-02-19 MED ORDER — BORTEZOMIB CHEMO SQ INJECTION 3.5 MG (2.5MG/ML)
1.3000 mg/m2 | Freq: Once | INTRAMUSCULAR | Status: AC
  Administered 2019-02-19: 3 mg via SUBCUTANEOUS
  Filled 2019-02-19: qty 1.2

## 2019-02-19 NOTE — Progress Notes (Signed)
Patient tolerated injection with no complaints voiced.  Site clean and dry with no bruising or swelling noted at site.  Band aid applied.  Vss with discharge and left ambulatory with no s/s of distress noted.  

## 2019-02-25 ENCOUNTER — Other Ambulatory Visit: Payer: Self-pay

## 2019-02-25 ENCOUNTER — Inpatient Hospital Stay (HOSPITAL_COMMUNITY): Payer: BC Managed Care – PPO

## 2019-02-25 ENCOUNTER — Other Ambulatory Visit (HOSPITAL_COMMUNITY): Payer: Self-pay | Admitting: *Deleted

## 2019-02-25 VITALS — BP 137/76 | HR 72 | Temp 97.2°F | Resp 18

## 2019-02-25 DIAGNOSIS — C9001 Multiple myeloma in remission: Secondary | ICD-10-CM

## 2019-02-25 DIAGNOSIS — C9000 Multiple myeloma not having achieved remission: Secondary | ICD-10-CM | POA: Diagnosis not present

## 2019-02-25 DIAGNOSIS — Z5111 Encounter for antineoplastic chemotherapy: Secondary | ICD-10-CM

## 2019-02-25 LAB — CBC WITH DIFFERENTIAL/PLATELET
Abs Immature Granulocytes: 0.01 10*3/uL (ref 0.00–0.07)
Basophils Absolute: 0 10*3/uL (ref 0.0–0.1)
Basophils Relative: 1 %
Eosinophils Absolute: 0 10*3/uL (ref 0.0–0.5)
Eosinophils Relative: 1 %
HCT: 32.3 % — ABNORMAL LOW (ref 36.0–46.0)
Hemoglobin: 10.8 g/dL — ABNORMAL LOW (ref 12.0–15.0)
Immature Granulocytes: 0 %
Lymphocytes Relative: 30 %
Lymphs Abs: 0.9 10*3/uL (ref 0.7–4.0)
MCH: 31.1 pg (ref 26.0–34.0)
MCHC: 33.4 g/dL (ref 30.0–36.0)
MCV: 93.1 fL (ref 80.0–100.0)
Monocytes Absolute: 0.4 10*3/uL (ref 0.1–1.0)
Monocytes Relative: 14 %
Neutro Abs: 1.6 10*3/uL — ABNORMAL LOW (ref 1.7–7.7)
Neutrophils Relative %: 54 %
Platelets: 140 10*3/uL — ABNORMAL LOW (ref 150–400)
RBC: 3.47 MIL/uL — ABNORMAL LOW (ref 3.87–5.11)
RDW: 15 % (ref 11.5–15.5)
WBC: 2.9 10*3/uL — ABNORMAL LOW (ref 4.0–10.5)
nRBC: 0 % (ref 0.0–0.2)

## 2019-02-25 LAB — COMPREHENSIVE METABOLIC PANEL
ALT: 31 U/L (ref 0–44)
AST: 37 U/L (ref 15–41)
Albumin: 4.3 g/dL (ref 3.5–5.0)
Alkaline Phosphatase: 77 U/L (ref 38–126)
Anion gap: 11 (ref 5–15)
BUN: 23 mg/dL — ABNORMAL HIGH (ref 6–20)
CO2: 25 mmol/L (ref 22–32)
Calcium: 9.6 mg/dL (ref 8.9–10.3)
Chloride: 103 mmol/L (ref 98–111)
Creatinine, Ser: 1.06 mg/dL — ABNORMAL HIGH (ref 0.44–1.00)
GFR calc Af Amer: >60 mL/min (ref >60)
GFR calc non Af Amer: 59 mL/min — ABNORMAL LOW (ref >60)
Glucose, Bld: 106 mg/dL — ABNORMAL HIGH (ref 70–99)
Potassium: 3.5 mmol/L (ref 3.5–5.1)
Sodium: 139 mmol/L (ref 135–145)
Total Bilirubin: 0.9 mg/dL (ref 0.3–1.2)
Total Protein: 7.5 g/dL (ref 6.5–8.1)

## 2019-02-25 LAB — MAGNESIUM: Magnesium: 1.4 mg/dL — ABNORMAL LOW (ref 1.7–2.4)

## 2019-02-25 MED ORDER — ZOLEDRONIC ACID 4 MG/100ML IV SOLN
4.0000 mg | Freq: Once | INTRAVENOUS | Status: AC
  Administered 2019-02-25: 4 mg via INTRAVENOUS
  Filled 2019-02-25: qty 100

## 2019-02-25 MED ORDER — SODIUM CHLORIDE 0.9% FLUSH
10.0000 mL | Freq: Once | INTRAVENOUS | Status: AC
  Administered 2019-02-25: 10 mL via INTRAVENOUS

## 2019-02-25 MED ORDER — SODIUM CHLORIDE 0.9 % IV SOLN
Freq: Once | INTRAVENOUS | Status: AC
  Administered 2019-02-25: 13:00:00 via INTRAVENOUS

## 2019-02-25 MED ORDER — MISC. DEVICES MISC: 0 refills | Status: DC

## 2019-02-25 NOTE — Progress Notes (Signed)
Magnesium 1.4 with Dr. Delton Coombes notified.  No orders received.    Patient tolerated zometa infusion with no complaints voiced.  Peripheral IV site clean and dry with no bruising or swelling noted at site.  Good blood return noted before and after infusion.  Band aid applied.  VSs with discharge and left ambulatory with no s/s of distress noted.

## 2019-02-25 NOTE — Progress Notes (Signed)
Orders placed for shingrix vaccine per [facility] orders.

## 2019-03-02 ENCOUNTER — Other Ambulatory Visit (HOSPITAL_COMMUNITY): Payer: Self-pay | Admitting: Hematology

## 2019-03-02 DIAGNOSIS — C9000 Multiple myeloma not having achieved remission: Secondary | ICD-10-CM

## 2019-03-03 ENCOUNTER — Other Ambulatory Visit (HOSPITAL_COMMUNITY): Payer: Self-pay | Admitting: *Deleted

## 2019-03-03 DIAGNOSIS — C9000 Multiple myeloma not having achieved remission: Secondary | ICD-10-CM

## 2019-03-03 MED ORDER — LENALIDOMIDE 10 MG PO CAPS: ORAL_CAPSULE | ORAL | 2 refills | Status: DC

## 2019-03-04 ENCOUNTER — Other Ambulatory Visit (HOSPITAL_COMMUNITY): Payer: Self-pay | Admitting: *Deleted

## 2019-03-04 DIAGNOSIS — C9000 Multiple myeloma not having achieved remission: Secondary | ICD-10-CM

## 2019-03-04 DIAGNOSIS — C9001 Multiple myeloma in remission: Secondary | ICD-10-CM

## 2019-03-05 ENCOUNTER — Inpatient Hospital Stay (HOSPITAL_COMMUNITY): Payer: BC Managed Care – PPO | Admitting: Hematology

## 2019-03-05 ENCOUNTER — Other Ambulatory Visit: Payer: Self-pay

## 2019-03-05 ENCOUNTER — Inpatient Hospital Stay (HOSPITAL_COMMUNITY): Payer: BC Managed Care – PPO

## 2019-03-05 ENCOUNTER — Encounter (HOSPITAL_COMMUNITY): Payer: Self-pay | Admitting: Hematology

## 2019-03-05 VITALS — BP 102/59 | HR 65 | Temp 96.8°F | Resp 18

## 2019-03-05 DIAGNOSIS — C9001 Multiple myeloma in remission: Secondary | ICD-10-CM

## 2019-03-05 DIAGNOSIS — C9000 Multiple myeloma not having achieved remission: Secondary | ICD-10-CM | POA: Diagnosis not present

## 2019-03-05 LAB — CBC WITH DIFFERENTIAL/PLATELET
Abs Immature Granulocytes: 0.02 10*3/uL (ref 0.00–0.07)
Basophils Absolute: 0 10*3/uL (ref 0.0–0.1)
Basophils Relative: 0 %
Eosinophils Absolute: 0.2 10*3/uL (ref 0.0–0.5)
Eosinophils Relative: 7 %
HCT: 31.9 % — ABNORMAL LOW (ref 36.0–46.0)
Hemoglobin: 10.5 g/dL — ABNORMAL LOW (ref 12.0–15.0)
Immature Granulocytes: 1 %
Lymphocytes Relative: 26 %
Lymphs Abs: 0.8 10*3/uL (ref 0.7–4.0)
MCH: 31.3 pg (ref 26.0–34.0)
MCHC: 32.9 g/dL (ref 30.0–36.0)
MCV: 94.9 fL (ref 80.0–100.0)
Monocytes Absolute: 0.4 10*3/uL (ref 0.1–1.0)
Monocytes Relative: 12 %
Neutro Abs: 1.6 10*3/uL — ABNORMAL LOW (ref 1.7–7.7)
Neutrophils Relative %: 54 %
Platelets: 201 10*3/uL (ref 150–400)
RBC: 3.36 MIL/uL — ABNORMAL LOW (ref 3.87–5.11)
RDW: 15.4 % (ref 11.5–15.5)
WBC: 3 10*3/uL — ABNORMAL LOW (ref 4.0–10.5)
nRBC: 0 % (ref 0.0–0.2)

## 2019-03-05 LAB — COMPREHENSIVE METABOLIC PANEL
ALT: 24 U/L (ref 0–44)
AST: 30 U/L (ref 15–41)
Albumin: 4 g/dL (ref 3.5–5.0)
Alkaline Phosphatase: 73 U/L (ref 38–126)
Anion gap: 10 (ref 5–15)
BUN: 27 mg/dL — ABNORMAL HIGH (ref 6–20)
CO2: 23 mmol/L (ref 22–32)
Calcium: 9.2 mg/dL (ref 8.9–10.3)
Chloride: 106 mmol/L (ref 98–111)
Creatinine, Ser: 1.49 mg/dL — ABNORMAL HIGH (ref 0.44–1.00)
GFR calc Af Amer: 46 mL/min — ABNORMAL LOW (ref >60)
GFR calc non Af Amer: 39 mL/min — ABNORMAL LOW (ref >60)
Glucose, Bld: 143 mg/dL — ABNORMAL HIGH (ref 70–99)
Potassium: 3.4 mmol/L — ABNORMAL LOW (ref 3.5–5.1)
Sodium: 139 mmol/L (ref 135–145)
Total Bilirubin: 0.6 mg/dL (ref 0.3–1.2)
Total Protein: 7.5 g/dL (ref 6.5–8.1)

## 2019-03-05 MED ORDER — BORTEZOMIB CHEMO SQ INJECTION 3.5 MG (2.5MG/ML)
1.3000 mg/m2 | Freq: Once | INTRAMUSCULAR | Status: AC
  Administered 2019-03-05: 3 mg via SUBCUTANEOUS
  Filled 2019-03-05: qty 1.2

## 2019-03-05 MED ORDER — PNEUMOCOCCAL 13-VAL CONJ VACC IM SUSP
0.5000 mL | Freq: Once | INTRAMUSCULAR | Status: AC
  Administered 2019-03-05: 0.5 mL via INTRAMUSCULAR

## 2019-03-05 MED ORDER — SODIUM CHLORIDE 0.9 % IV SOLN
INTRAVENOUS | Status: AC
  Administered 2019-03-05: 13:00:00 via INTRAVENOUS

## 2019-03-05 MED ORDER — HEPATITIS B VAC RECOMBINANT 20 MCG/ML IM INJ
40.0000 ug | INJECTION | Freq: Once | INTRAMUSCULAR | Status: AC
  Administered 2019-03-05: 40 ug via INTRAMUSCULAR
  Filled 2019-03-05: qty 2

## 2019-03-05 MED ORDER — PROCHLORPERAZINE MALEATE 10 MG PO TABS: 10.0000 mg | ORAL_TABLET | Freq: Once | ORAL | Status: DC

## 2019-03-05 MED ORDER — DTAP-IPV-HIB VACCINE IM SUSR
0.5000 mL | Freq: Once | INTRAMUSCULAR | Status: AC
  Administered 2019-03-05: 0.5 mL via INTRAMUSCULAR
  Filled 2019-03-05: qty 1

## 2019-03-05 NOTE — Patient Instructions (Addendum)
[place] at [facility] Discharge Instructions  You were seen today by Dr. Delton Coombes. He went over your recent lab results. He will see you back in 1 week for labs and follow up.  STOP taking the Losartin and the Bumex  Thank you for choosing [place] at [facility] to provide your oncology and hematology care.  To afford each patient quality time with our provider, please arrive at least 15 minutes before your scheduled appointment time.   If you have a lab appointment with the [place] please come in thru the  Main Entrance and check in at the main information desk  You need to re-schedule your appointment should you arrive 10 or more minutes late.  We strive to give you quality time with our providers, and arriving late affects you and other patients whose appointments are after yours.  Also, if you no show three or more times for appointments you may be dismissed from the clinic at the providers discretion.     Again, thank you for choosing [id number].  Our hope is that these requests will decrease the amount of time that you wait before being seen by our physicians.       _____________________________________________________________  Should you have questions after your visit to [facility], please contact our office at [phone] between the hours of 8:00 a.m. and 4:30 p.m.  Voicemails left after 4:00 p.m. will not be returned until the following business day.  For prescription refill requests, have your pharmacy contact our office and allow 72 hours.    Cancer Center Support Programs:   > Cancer Support Group  2nd Tuesday of the month 1pm-2pm, Journey Room

## 2019-03-05 NOTE — Progress Notes (Signed)
Joanna Reid presents today for Velcade and post transplant vaccinations. Pt tolerated injections without incident or complaint. Pt also to received 500cc NS over 1 hour per Dr. Delton Coombes for Cr 1.49. Pt tolerated hydration without incident or complaint. VSS. Discharged self ambulatory in satisfactory condition.

## 2019-03-05 NOTE — Progress Notes (Signed)
Joanna Reid, [place] [address]   CLINIC:  Medical Oncology/Hematology  PCP:  Mikey Kirschner, MD [address] [place] [address] [phone]   REASON FOR VISIT:  Follow-up for multiple myeloma    BRIEF ONCOLOGIC HISTORY:  Oncology History  Multiple myeloma ([place])  10/29/2017 Initial Diagnosis   Multiple myeloma ([place])   11/01/2017 - 01/24/2018 Chemotherapy   The patient had bortezomib SQ (VELCADE) chemo injection 3 mg, 1.3 mg/m2 = 3 mg, Subcutaneous,  Once, 5 of 5 cycles Administration: 3 mg (11/01/2017), 3 mg (11/08/2017), 3 mg (11/05/2017), 3 mg (11/22/2017), 3 mg (11/12/2017), 3 mg (11/29/2017), 3 mg (11/26/2017), 3 mg (12/03/2017), 3 mg (12/13/2017), 3 mg (12/20/2017), 3 mg (12/17/2017), 3 mg (12/24/2017), 3 mg (01/03/2018), 3 mg (01/10/2018), 3 mg (01/07/2018), 3 mg (01/14/2018), 3 mg (01/24/2018)  for chemotherapy treatment.    06/11/2018 -  Chemotherapy   The patient had bortezomib SQ (VELCADE) chemo injection 3 mg, 1.3 mg/m2 = 3 mg, Subcutaneous,  Once, 22 of 29 cycles Administration: 3 mg (06/11/2018), 3 mg (07/29/2018), 3 mg (08/21/2018), 3 mg (09/04/2018), 3 mg (09/18/2018), 3 mg (10/02/2018), 3 mg (10/16/2018), 3 mg (10/29/2018), 3 mg (11/13/2018), 3 mg (06/18/2018), 3 mg (06/25/2018), 3 mg (07/01/2018), 3 mg (07/08/2018), 3 mg (07/15/2018), 3 mg (11/27/2018), 3 mg (12/11/2018), 3 mg (12/25/2018), 3 mg (01/08/2019), 3 mg (01/22/2019), 3 mg (02/05/2019), 3 mg (02/19/2019), 3 mg (03/05/2019)  for chemotherapy treatment.       CANCER STAGING: Cancer Staging Multiple myeloma ([place]) Staging form: Plasma Cell Myeloma and Plasma Cell Disorders, AJCC 8th Edition - Clinical: No stage assigned - Unsigned    INTERVAL HISTORY:  [name] 53 y.o. female seen for follow-up of multiple myeloma.  Appetite and energy levels are 100%.  No pain is reported.  She was seen by [facility] transplant team and endocrinology by virtual visits.  Reportedly her myeloma testing was good on  02/09/2019.  Her insulin was discontinued by her endocrinologist.  Numbness This is well controlled.  She is taking Bumex daily.  Denies any fevers, night sweats or weight loss.   REVIEW OF SYSTEMS:  Review of Systems  All other systems reviewed and are negative.    PAST MEDICAL/SURGICAL HISTORY:  Past Medical History:  Diagnosis Date   Acid reflux    Allergic rhinitis    Cancer (HCC)    multiple myeloma   Diabetes mellitus    type 2   Gout    Gout    HBP (high blood pressure)    History of kidney stones    Migraines    Past Surgical History:  Procedure Laterality Date   CESAREAN SECTION     EXTRACORPOREAL SHOCK WAVE LITHOTRIPSY Left 10/10/2017   Procedure: LEFT EXTRACORPOREAL SHOCK WAVE LITHOTRIPSY (ESWL);  Surgeon: Bjorn Loser, MD;  Location: [facility];  Service: Urology;  Laterality: Left;   EYE SURGERY     HEMORRHOID SURGERY N/A 11/19/2012   Procedure: HEMORRHOIDECTOMY;  Surgeon: Jamesetta So, MD;  Location: [facility];  Service: General;  Laterality: N/A;   kidney stones  1998   LAPAROSCOPIC UNILATERAL SALPINGO OOPHERECTOMY  05/14/2012   Procedure: LAPAROSCOPIC UNILATERAL SALPINGO OOPHORECTOMY;  Surgeon: [name], MD;  Location: [facility];  Service: Gynecology;  Laterality: Right;  laparoscopic right salpingo-oophorectomy   PARTIAL HYSTERECTOMY     TONSILECTOMY, ADENOIDECTOMY, BILATERAL MYRINGOTOMY AND TUBES     VESICOVAGINAL FISTULA CLOSURE W/ TAH  SOCIAL HISTORY:  Social History   Socioeconomic History   Marital status: Married    Spouse name: Not on file   Number of children: Not on file   Years of education: 12   Highest education level: Not on file  Occupational History    Employer: [employer]  Social Needs   Financial resource strain: Not on file   Food insecurity    Worry: Never true    Inability: Never true   Transportation needs    Medical: Not on file    Non-medical: Not on file  Tobacco Use   Smoking status: Former  Smoker    Quit date: 02/27/1999    Years since quitting: 20.0   Smokeless tobacco: Never Used   Tobacco comment: socially   Substance and Sexual Activity   Alcohol use: No    Alcohol/week: 0.0 standard drinks   Drug use: No   Sexual activity: Yes  Lifestyle   Physical activity    Days per week: Not on file    Minutes per session: Not on file   Stress: Not on file  Relationships   Social connections    Talks on phone: Not on file    Gets together: Not on file    Attends religious service: Not on file    Active member of club or organization: Not on file    Attends meetings of clubs or organizations: Not on file    Relationship status: Not on file   Intimate partner violence    Fear of current or ex partner: Not on file    Emotionally abused: Not on file    Physically abused: Not on file    Forced sexual activity: Not on file  Other Topics Concern   Not on file  Social History Narrative   Not on file    FAMILY HISTORY:  Family History  Problem Relation Age of Onset   Arthritis Other    Cancer Other    Diabetes Other    Hypertension Mother    Dementia Mother    Diabetes Father    ALS Father    Diabetes Brother    Hypertension Brother    Cancer Paternal Aunt    COPD Maternal Grandmother    Cancer Maternal Grandfather    Anesthesia problems Paternal Grandfather     CURRENT MEDICATIONS:  Outpatient Encounter Medications as of 03/05/2019  Medication Sig Note   acyclovir (ZOVIRAX) 800 MG tablet Take 800 mg by mouth 2 (two) times daily.     albuterol (PROVENTIL HFA;VENTOLIN HFA) 108 (90 Base) MCG/ACT inhaler Inhale 2 puffs into the lungs every 4 (four) hours as needed for wheezing or shortness of breath.    allopurinol (ZYLOPRIM) 300 MG tablet Take one daily    aspirin EC 81 MG tablet Take 81 mg by mouth daily.    Bortezomib (VELCADE IJ) Inject as directed every 14 (fourteen) days.  08/09/2018: Due for next injection on 08/13/2018    bumetanide (BUMEX) 1 MG tablet Take 1 tablet (1 mg total) by mouth every morning.    carvedilol (COREG) 6.25 MG tablet Take 6.25 mg by mouth 2 (two) times daily with a meal.     diazepam (VALIUM) 5 MG tablet Take 1 tablet (5 mg total) by mouth at bedtime.    Docosanol 10 % CREA Apply 0.5 inches topically 2 (two) times a day.    docusate sodium (COLACE) 100 MG capsule Take 100 mg by mouth daily.     fluticasone (FLONASE)  50 MCG/ACT nasal spray Place 2 sprays into the nose 2 (two) times daily.    HYDROcodone-acetaminophen (NORCO/VICODIN) 5-325 MG tablet Take 1 tablet by mouth at bedtime as needed for moderate pain.    lenalidomide (REVLIMID) 10 MG capsule TAKE 1 CAPSULE BY MOUTH  DAILY FOR 21 DAYS ON, THEN  7 DAYS OFF    losartan (COZAAR) 100 MG tablet TAKE (1) TABLET BY MOUTH ONCE DAILY.    magnesium oxide (MAG-OX) 400 (241.3 Mg) MG tablet Take 1 tablet (400 mg total) by mouth 3 (three) times daily.    metFORMIN (GLUCOPHAGE) 500 MG tablet TAKE 1 TABLET BY MOUTH ONCE DAILY WITH BREAKFAST (Patient taking differently: Take 1,000 mg by mouth 2 (two) times daily with a meal. )    Misc. Devices MISC Please administer recombinant zoster vaccine (Shingrix)    omeprazole (PRILOSEC) 20 MG capsule Take 1 capsule (20 mg total) by mouth daily.    ondansetron (ZOFRAN) 8 MG tablet Take 1 tablet (8 mg total) by mouth every 8 (eight) hours as needed.    potassium chloride SA (K-DUR,KLOR-CON) 20 MEQ tablet Take 20 mEq by mouth daily.     pregabalin (LYRICA) 200 MG capsule TAKE (1) CAPSULE BY MOUTH TWICE DAILY.    prochlorperazine (COMPAZINE) 10 MG tablet Take 1 tablet (10 mg total) by mouth every 6 (six) hours as needed for nausea or vomiting. 08/09/2018: Takes prior to Velcade injection   rosuvastatin (CRESTOR) 5 MG tablet Take 5 mg by mouth daily.     TRULICITY 4.00 QQ/7.6PP SOPN Inject 0.75 mg into the skin every Monday.     valACYclovir (VALTREX) 1000 MG tablet Take 1 tablet (1,000 mg total) by  mouth daily. Take 2 tablets now and 2 tablets 12 hours later    verapamil (CALAN-SR) 240 MG CR tablet Take 1 tablet (240 mg total) by mouth at bedtime.    [DISCONTINUED] insulin glargine (LANTUS) 100 UNIT/ML injection Inject 7 Units into the skin at bedtime.    [DISCONTINUED] LANTUS SOLOSTAR 100 UNIT/ML Solostar Pen     No facility-administered encounter medications on file as of 03/05/2019.     ALLERGIES:  No Known Allergies   PHYSICAL EXAM:  ECOG Performance status: 1  Vitals:   03/05/19 1200  BP: 118/71  Pulse: 65  Resp: 16  Temp: (!) 96.6 F (35.9 C)  SpO2: 97%   Filed Weights   03/05/19 1200  Weight: 268 lb 7 oz (121.8 kg)    Physical Exam Vitals signs reviewed.  Constitutional:      Appearance: Normal appearance.  Cardiovascular:     Rate and Rhythm: Normal rate and regular rhythm.     Heart sounds: Normal heart sounds.  Pulmonary:     Effort: Pulmonary effort is normal.     Breath sounds: Normal breath sounds.  Abdominal:     General: There is no distension.     Palpations: Abdomen is soft. There is no mass.  Musculoskeletal:        General: No swelling.  Skin:    General: Skin is warm.  Neurological:     General: No focal deficit present.     Mental Status: She is alert and oriented to person, place, and time.  Psychiatric:        Mood and Affect: Mood normal.        Behavior: Behavior normal.      LABORATORY DATA:  I have reviewed the labs as listed.  CBC    Component Value Date/Time  WBC 3.0 (L) 03/05/2019 1113   RBC 3.36 (L) 03/05/2019 1113   HGB 10.5 (L) 03/05/2019 1113   HGB 14.2 08/02/2015 1113   HCT 31.9 (L) 03/05/2019 1113   HCT 41.1 08/02/2015 1113   PLT 201 03/05/2019 1113   PLT 371 08/02/2015 1113   MCV 94.9 03/05/2019 1113   MCV 84 08/02/2015 1113   MCH 31.3 03/05/2019 1113   MCHC 32.9 03/05/2019 1113   RDW 15.4 03/05/2019 1113   RDW 15.1 08/02/2015 1113   LYMPHSABS 0.8 03/05/2019 1113   LYMPHSABS 2.9 08/02/2015 1113     MONOABS 0.4 03/05/2019 1113   EOSABS 0.2 03/05/2019 1113   EOSABS 0.2 08/02/2015 1113   BASOSABS 0.0 03/05/2019 1113   BASOSABS 0.1 08/02/2015 1113   CMP Latest Ref Rng & Units 03/05/2019 02/25/2019 02/19/2019  Glucose 70 - 99 mg/dL 143(H) 106(H) 114(H)  BUN 6 - 20 mg/dL 27(H) 23(H) 28(H)  Creatinine 0.44 - 1.00 mg/dL 1.49(H) 1.06(H) 1.15(H)  Sodium 135 - 145 mmol/L 139 139 139  Potassium 3.5 - 5.1 mmol/L 3.4(L) 3.5 3.7  Chloride 98 - 111 mmol/L 106 103 105  CO2 22 - 32 mmol/L _0 Calcium 8.9 - 10.3 mg/dL 9.2 9.6 9.1  Total Protein 6.5 - 8.1 g/dL 7.5 7.5 7.8  Total Bilirubin 0.3 - 1.2 mg/dL 0.6 0.9 0.6  Alkaline Phos 38 - 126 U/L 73 77 72  AST 15 - 41 U/L 30 37 35  ALT 0 - 44 U/L _1 DIAGNOSTIC IMAGING:  I have independently reviewed the scans and discussed with the patient.   I have reviewed Venita Lick LPN's note and agree with the documentation.  I personally performed a face-to-face visit, made revisions and my assessment and plan is as follows.    ASSESSMENT & PLAN:   Multiple myeloma ([place]) 1.  IgG lambda multiple myeloma, stage III, del 17p: - Presentation with lower back pain since March, presented to the emergency room on 10/25/2017 with right groin pain, CT scan showed multiple lytic lesions, multiple lytic lesions on skeletal survey - SPEP shows 1.5 g/dL of monoclonal protein, beta-2 microglobulin of 4.8, free lambda light chains of 2869, kappa by lambda ratio of less than 0, elevated LDH. 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  FISH panel shows -14, -12, 13 q.-/-13 and 17p-.  Bone marrow biopsy shows 90% plasma cells. -4 cycles of RVD from 11/01/2017 through 01/14/2018.  Labs done on 01/20/2018 showed M spike to be negative. - She was evaluated by Dr. Laverta Baltimore at [facility].  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 - Because of her high risk disease,  consolidation with 2 cycles of chemotherapy was done with RVD from 06/11/2018 through 07/01/2018.  -Maintenance Velcade every 2 weeks and Revlimid 10 mg 3 weeks on 1 week off started on 07/29/2018.   -Multiple myeloma labs from [place] on 02/09/2019 were negative for SPEP, immunofixation.  Free light chain ratio was normal. - She completed Revlimid on 03/03/2019. - Her creatinine today increased to 1.49.  Previously it was 1.06.  She received Zometa on 02/25/2019. - I have told her to hold her Bumex and losartan.  I plan to repeat her chemistries next week.  She will receive 500 mL of normal saline today.  If the blood pressure is too high, she will take half tablet of losartan. -She will hold her Revlimid which  is due on 03/11/2019 until her creatinine comes down.  If the creatinine stays high, will have dose reduce Revlimid to 5 mg.   2.  Bone strengthening agents: -She is continuing Zometa every 3 months.  Last dose was 02/25/2019.  3.  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  She will continue Lyrica 200 mg twice daily.  4.  ID prophylaxis: -She will continue acyclovir twice daily.  Will consider dose reduction to 400 mg twice daily.  5.  Hypomagnesemia: -She is taking 2 tablets 3 times a day. -Magnesium at last visit was 1.4 on 02/25/2019. -This is from a combination of Revlimid and metformin.  She gets diarrhea from metformin.  She does have about 3 watery bowel movements per day.  6.  Diabetes: -She will continue metformin 1000 mg twice daily. -She saw endocrinology at [facility] and Lantus was discontinued.  She is taking Trulicity every week.    Total time spent is 25 minutes with more than 50% of the time spent face-to-face discussing treatment plan and coordination of care.  Orders placed this encounter:  No orders of the defined types were placed in this encounter.     Derek Jack, MD [place] [phone]

## 2019-03-05 NOTE — Assessment & Plan Note (Signed)
1.  IgG lambda multiple myeloma, stage III, del 17p: - Presentation with lower back pain since March, presented to the emergency room on 10/25/2017 with right groin pain, CT scan showed multiple lytic lesions, multiple lytic lesions on skeletal survey - SPEP shows 1.5 g/dL of monoclonal protein, beta-2 microglobulin of 4.8, free lambda light chains of 2869, kappa by lambda ratio of less than 0, elevated LDH. 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  FISH panel shows -14, -12, 13 q.-/-13 and 17p-.  Bone marrow biopsy shows 90% plasma cells. -4 cycles of RVD from 11/01/2017 through 01/14/2018.  Labs done on 01/20/2018 showed M spike to be negative. - She was evaluated by Dr. Laverta Baltimore at [facility].  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 - Because of her high risk disease, consolidation with 2 cycles of chemotherapy was done with RVD from 06/11/2018 through 07/01/2018.  -Maintenance Velcade every 2 weeks and Revlimid 10 mg 3 weeks on 1 week off started on 07/29/2018.   -Multiple myeloma labs from [place] on 02/09/2019 were negative for SPEP, immunofixation.  Free light chain ratio was normal. - She completed Revlimid on 03/03/2019. - Her creatinine today increased to 1.49.  Previously it was 1.06.  She received Zometa on 02/25/2019. - I have told her to hold her Bumex and losartan.  I plan to repeat her chemistries next week.  She will receive 500 mL of normal saline today.  If the blood pressure is too high, she will take half tablet of losartan. -She will hold her Revlimid which is due on 03/11/2019 until her creatinine comes down.  If the creatinine stays high, will have dose reduce Revlimid to 5 mg.   2.  Bone strengthening agents: -She is continuing Zometa every 3 months.  Last dose was 02/25/2019.  3.  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  She will continue Lyrica 200 mg  twice daily.  4.  ID prophylaxis: -She will continue acyclovir twice daily.  Will consider dose reduction to 400 mg twice daily.  5.  Hypomagnesemia: -She is taking 2 tablets 3 times a day. -Magnesium at last visit was 1.4 on 02/25/2019. -This is from a combination of Revlimid and metformin.  She gets diarrhea from metformin.  She does have about 3 watery bowel movements per day.  6.  Diabetes: -She will continue metformin 1000 mg twice daily. -She saw endocrinology at [facility] and Lantus was discontinued.  She is taking Trulicity every week.

## 2019-03-05 NOTE — Patient Instructions (Signed)
[facility] Discharge Instructions for Patients Receiving Chemotherapy   Beginning January 23rd 2017 lab work for the Cancer Center will be done in the  Main lab at [facility] on 1st floor. If you have a lab appointment with the Cancer Center please come in thru the  Main Entrance and check in at the main information desk   Today you received the following chemotherapy agents Velcade  To help prevent nausea and vomiting after your treatment, we encourage you to take your nausea medication    If you develop nausea and vomiting, or diarrhea that is not controlled by your medication, call the clinic.  The clinic phone number is [phone]. Office hours are Monday-Friday 8:30am-5:00pm.  BELOW ARE SYMPTOMS THAT SHOULD BE REPORTED IMMEDIATELY:  *FEVER GREATER THAN 101.0 F  *CHILLS WITH OR WITHOUT FEVER  NAUSEA AND VOMITING THAT IS NOT CONTROLLED WITH YOUR NAUSEA MEDICATION  *UNUSUAL SHORTNESS OF BREATH  *UNUSUAL BRUISING OR BLEEDING  TENDERNESS IN MOUTH AND THROAT WITH OR WITHOUT PRESENCE OF ULCERS  *URINARY PROBLEMS  *BOWEL PROBLEMS  UNUSUAL RASH Items with * indicate a potential emergency and should be followed up as soon as possible. If you have an emergency after office hours please contact your primary care physician or go to the nearest emergency department.  Please call the clinic during office hours if you have any questions or concerns.   You may also contact the Patient Navigator at [phone] should you have any questions or need assistance in obtaining follow up care.      Resources For Cancer Patients and their Caregivers ? American Cancer Society: Can assist with transportation, wigs, general needs, runs Look Good Feel Better.        1-888-227-6333 ? Cancer Care: Provides financial assistance, online support groups, medication/co-pay assistance.  1-800-813-HOPE (4673) ? [facility] Assists [place] cancer  patients and their families through emotional , educational and financial support.  336-427-4357 ? [place] DSS Where to apply for food stamps, Medicaid and utility assistance. 336-342-1394 ? [facility]: Transportation to medical appointments. 336-347-2287 ? Social Security Administration: May apply for disability if have a Stage IV cancer. 336-342-7796 1-800-772-1213 ? [place] Aging, Disability and Transit Services: Assists with nutrition, care and transit needs. 336-349-2343          

## 2019-03-10 MED FILL — Hepatitis B Vaccine (Recombinant) Susp 20 MCG/ML: INTRAMUSCULAR | Qty: 2 | Status: AC

## 2019-03-11 ENCOUNTER — Other Ambulatory Visit (HOSPITAL_COMMUNITY): Payer: Self-pay | Admitting: Nurse Practitioner

## 2019-03-11 DIAGNOSIS — C9000 Multiple myeloma not having achieved remission: Secondary | ICD-10-CM

## 2019-03-12 ENCOUNTER — Ambulatory Visit (HOSPITAL_COMMUNITY): Payer: BC Managed Care – PPO | Admitting: Hematology

## 2019-03-12 ENCOUNTER — Other Ambulatory Visit (HOSPITAL_COMMUNITY): Payer: BC Managed Care – PPO

## 2019-03-12 ENCOUNTER — Other Ambulatory Visit: Payer: Self-pay

## 2019-03-12 ENCOUNTER — Inpatient Hospital Stay (HOSPITAL_COMMUNITY): Payer: BC Managed Care – PPO | Attending: Hematology

## 2019-03-12 ENCOUNTER — Encounter (HOSPITAL_COMMUNITY): Payer: Self-pay

## 2019-03-12 ENCOUNTER — Encounter (HOSPITAL_COMMUNITY): Payer: Self-pay | Admitting: *Deleted

## 2019-03-12 DIAGNOSIS — Z79899 Other long term (current) drug therapy: Secondary | ICD-10-CM | POA: Insufficient documentation

## 2019-03-12 DIAGNOSIS — G620 Drug-induced polyneuropathy: Secondary | ICD-10-CM | POA: Insufficient documentation

## 2019-03-12 DIAGNOSIS — Z5112 Encounter for antineoplastic immunotherapy: Secondary | ICD-10-CM | POA: Diagnosis present

## 2019-03-12 DIAGNOSIS — E119 Type 2 diabetes mellitus without complications: Secondary | ICD-10-CM | POA: Insufficient documentation

## 2019-03-12 DIAGNOSIS — C9000 Multiple myeloma not having achieved remission: Secondary | ICD-10-CM | POA: Diagnosis present

## 2019-03-12 LAB — COMPREHENSIVE METABOLIC PANEL
ALT: 18 U/L (ref 0–44)
AST: 21 U/L (ref 15–41)
Albumin: 3.6 g/dL (ref 3.5–5.0)
Alkaline Phosphatase: 59 U/L (ref 38–126)
Anion gap: 8 (ref 5–15)
BUN: 19 mg/dL (ref 6–20)
CO2: 22 mmol/L (ref 22–32)
Calcium: 8.8 mg/dL — ABNORMAL LOW (ref 8.9–10.3)
Chloride: 107 mmol/L (ref 98–111)
Creatinine, Ser: 1.21 mg/dL — ABNORMAL HIGH (ref 0.44–1.00)
GFR calc Af Amer: 59 mL/min — ABNORMAL LOW (ref >60)
GFR calc non Af Amer: 51 mL/min — ABNORMAL LOW (ref >60)
Glucose, Bld: 140 mg/dL — ABNORMAL HIGH (ref 70–99)
Potassium: 3.7 mmol/L (ref 3.5–5.1)
Sodium: 137 mmol/L (ref 135–145)
Total Bilirubin: 0.8 mg/dL (ref 0.3–1.2)
Total Protein: 6.8 g/dL (ref 6.5–8.1)

## 2019-03-12 LAB — MAGNESIUM: Magnesium: 1.4 mg/dL — ABNORMAL LOW (ref 1.7–2.4)

## 2019-03-18 ENCOUNTER — Other Ambulatory Visit (HOSPITAL_COMMUNITY): Payer: Self-pay | Admitting: *Deleted

## 2019-03-18 DIAGNOSIS — C9000 Multiple myeloma not having achieved remission: Secondary | ICD-10-CM

## 2019-03-19 ENCOUNTER — Inpatient Hospital Stay (HOSPITAL_COMMUNITY): Payer: BC Managed Care – PPO

## 2019-03-19 ENCOUNTER — Other Ambulatory Visit (HOSPITAL_COMMUNITY): Payer: Self-pay | Admitting: Hematology

## 2019-03-19 ENCOUNTER — Encounter (HOSPITAL_COMMUNITY): Payer: Self-pay

## 2019-03-19 ENCOUNTER — Other Ambulatory Visit: Payer: Self-pay

## 2019-03-19 VITALS — BP 136/77 | HR 60 | Temp 97.8°F | Resp 18 | Wt 268.0 lb

## 2019-03-19 DIAGNOSIS — C9000 Multiple myeloma not having achieved remission: Secondary | ICD-10-CM

## 2019-03-19 DIAGNOSIS — C9001 Multiple myeloma in remission: Secondary | ICD-10-CM

## 2019-03-19 DIAGNOSIS — Z5112 Encounter for antineoplastic immunotherapy: Secondary | ICD-10-CM | POA: Diagnosis not present

## 2019-03-19 LAB — CBC WITH DIFFERENTIAL/PLATELET
Abs Immature Granulocytes: 0 10*3/uL (ref 0.00–0.07)
Basophils Absolute: 0.1 10*3/uL (ref 0.0–0.1)
Basophils Relative: 2 %
Eosinophils Absolute: 0 10*3/uL (ref 0.0–0.5)
Eosinophils Relative: 1 %
HCT: 31.4 % — ABNORMAL LOW (ref 36.0–46.0)
Hemoglobin: 10 g/dL — ABNORMAL LOW (ref 12.0–15.0)
Immature Granulocytes: 0 %
Lymphocytes Relative: 31 %
Lymphs Abs: 1 10*3/uL (ref 0.7–4.0)
MCH: 30.6 pg (ref 26.0–34.0)
MCHC: 31.8 g/dL (ref 30.0–36.0)
MCV: 96 fL (ref 80.0–100.0)
Monocytes Absolute: 0.4 10*3/uL (ref 0.1–1.0)
Monocytes Relative: 13 %
Neutro Abs: 1.7 10*3/uL (ref 1.7–7.7)
Neutrophils Relative %: 53 %
Platelets: 211 10*3/uL (ref 150–400)
RBC: 3.27 MIL/uL — ABNORMAL LOW (ref 3.87–5.11)
RDW: 15.6 % — ABNORMAL HIGH (ref 11.5–15.5)
WBC: 3.1 10*3/uL — ABNORMAL LOW (ref 4.0–10.5)
nRBC: 0 % (ref 0.0–0.2)

## 2019-03-19 LAB — COMPREHENSIVE METABOLIC PANEL
ALT: 18 U/L (ref 0–44)
AST: 29 U/L (ref 15–41)
Albumin: 4 g/dL (ref 3.5–5.0)
Alkaline Phosphatase: 66 U/L (ref 38–126)
Anion gap: 10 (ref 5–15)
BUN: 26 mg/dL — ABNORMAL HIGH (ref 6–20)
CO2: 24 mmol/L (ref 22–32)
Calcium: 9.3 mg/dL (ref 8.9–10.3)
Chloride: 104 mmol/L (ref 98–111)
Creatinine, Ser: 1.3 mg/dL — ABNORMAL HIGH (ref 0.44–1.00)
GFR calc Af Amer: 54 mL/min — ABNORMAL LOW (ref >60)
GFR calc non Af Amer: 46 mL/min — ABNORMAL LOW (ref >60)
Glucose, Bld: 121 mg/dL — ABNORMAL HIGH (ref 70–99)
Potassium: 3.8 mmol/L (ref 3.5–5.1)
Sodium: 138 mmol/L (ref 135–145)
Total Bilirubin: 0.6 mg/dL (ref 0.3–1.2)
Total Protein: 7.4 g/dL (ref 6.5–8.1)

## 2019-03-19 LAB — MAGNESIUM: Magnesium: 1.6 mg/dL — ABNORMAL LOW (ref 1.7–2.4)

## 2019-03-19 MED ORDER — PROCHLORPERAZINE MALEATE 10 MG PO TABS: 10.0000 mg | ORAL_TABLET | Freq: Once | ORAL | Status: DC

## 2019-03-19 MED ORDER — LENALIDOMIDE 5 MG PO CAPS: ORAL_CAPSULE | ORAL | 3 refills | Status: DC

## 2019-03-19 MED ORDER — BORTEZOMIB CHEMO SQ INJECTION 3.5 MG (2.5MG/ML)
1.3000 mg/m2 | Freq: Once | INTRAMUSCULAR | Status: AC
  Administered 2019-03-19: 3 mg via SUBCUTANEOUS
  Filled 2019-03-19: qty 1.2

## 2019-03-19 NOTE — Progress Notes (Signed)
Pt presents today for Velcade injection. VS and labs within parameters for tx. Labs reviewed with RNester NP. Proceed with tx VO received. W.O. received to continue to hold losartan. Decrease Revlimid to 5mg .   Velcade given today per MD orders. Tolerated infusion without adverse affects. Vital signs stable. No complaints at this time. Discharged from clinic ambulatory. F/U with [facility] as scheduled.

## 2019-03-19 NOTE — Progress Notes (Signed)
Patient tolerated injection with no complaints voiced.  Site clean and dry with no bruising or swelling noted at site.  Band aid applied.  Vss with discharge and left ambulatory with no s/s of distress noted.  

## 2019-03-19 NOTE — Patient Instructions (Signed)
[facility] Discharge Instructions for Patients Receiving Chemotherapy  Today you received the following chemotherapy agents   To help prevent nausea and vomiting after your treatment, we encourage you to take your nausea medication   If you develop nausea and vomiting that is not controlled by your nausea medication, call the clinic.   BELOW ARE SYMPTOMS THAT SHOULD BE REPORTED IMMEDIATELY:  *FEVER GREATER THAN 100.5 F  *CHILLS WITH OR WITHOUT FEVER  NAUSEA AND VOMITING THAT IS NOT CONTROLLED WITH YOUR NAUSEA MEDICATION  *UNUSUAL SHORTNESS OF BREATH  *UNUSUAL BRUISING OR BLEEDING  TENDERNESS IN MOUTH AND THROAT WITH OR WITHOUT PRESENCE OF ULCERS  *URINARY PROBLEMS  *BOWEL PROBLEMS  UNUSUAL RASH Items with * indicate a potential emergency and should be followed up as soon as possible.  Feel free to call the clinic should you have any questions or concerns. The clinic phone number is [phone].  Please show the CHEMO ALERT CARD at check-in to the Emergency Department and triage nurse.   

## 2019-03-26 ENCOUNTER — Other Ambulatory Visit (HOSPITAL_COMMUNITY): Payer: Self-pay | Admitting: Hematology

## 2019-03-26 DIAGNOSIS — C9000 Multiple myeloma not having achieved remission: Secondary | ICD-10-CM

## 2019-03-30 ENCOUNTER — Other Ambulatory Visit (HOSPITAL_COMMUNITY): Payer: Self-pay | Admitting: Hematology

## 2019-03-30 DIAGNOSIS — C9000 Multiple myeloma not having achieved remission: Secondary | ICD-10-CM

## 2019-04-01 ENCOUNTER — Other Ambulatory Visit: Payer: Self-pay | Admitting: Family Medicine

## 2019-04-01 NOTE — Telephone Encounter (Signed)
Six mo worth 

## 2019-04-02 ENCOUNTER — Encounter (HOSPITAL_COMMUNITY): Payer: Self-pay | Admitting: Hematology

## 2019-04-02 ENCOUNTER — Inpatient Hospital Stay (HOSPITAL_COMMUNITY): Payer: BC Managed Care – PPO

## 2019-04-02 ENCOUNTER — Other Ambulatory Visit: Payer: Self-pay

## 2019-04-02 ENCOUNTER — Inpatient Hospital Stay (HOSPITAL_BASED_OUTPATIENT_CLINIC_OR_DEPARTMENT_OTHER): Payer: BC Managed Care – PPO | Admitting: Hematology

## 2019-04-02 ENCOUNTER — Other Ambulatory Visit: Payer: Self-pay | Admitting: Family Medicine

## 2019-04-02 DIAGNOSIS — C9001 Multiple myeloma in remission: Secondary | ICD-10-CM

## 2019-04-02 DIAGNOSIS — C9000 Multiple myeloma not having achieved remission: Secondary | ICD-10-CM

## 2019-04-02 DIAGNOSIS — Z5112 Encounter for antineoplastic immunotherapy: Secondary | ICD-10-CM | POA: Diagnosis not present

## 2019-04-02 LAB — COMPREHENSIVE METABOLIC PANEL
ALT: 22 U/L (ref 0–44)
AST: 28 U/L (ref 15–41)
Albumin: 4 g/dL (ref 3.5–5.0)
Alkaline Phosphatase: 58 U/L (ref 38–126)
Anion gap: 9 (ref 5–15)
BUN: 21 mg/dL — ABNORMAL HIGH (ref 6–20)
CO2: 24 mmol/L (ref 22–32)
Calcium: 8.9 mg/dL (ref 8.9–10.3)
Chloride: 105 mmol/L (ref 98–111)
Creatinine, Ser: 1.12 mg/dL — ABNORMAL HIGH (ref 0.44–1.00)
GFR calc Af Amer: >60 mL/min (ref >60)
GFR calc non Af Amer: 56 mL/min — ABNORMAL LOW (ref >60)
Glucose, Bld: 138 mg/dL — ABNORMAL HIGH (ref 70–99)
Potassium: 3.6 mmol/L (ref 3.5–5.1)
Sodium: 138 mmol/L (ref 135–145)
Total Bilirubin: 0.6 mg/dL (ref 0.3–1.2)
Total Protein: 7.5 g/dL (ref 6.5–8.1)

## 2019-04-02 LAB — CBC WITH DIFFERENTIAL/PLATELET
Abs Immature Granulocytes: 0.01 10*3/uL (ref 0.00–0.07)
Basophils Absolute: 0 10*3/uL (ref 0.0–0.1)
Basophils Relative: 1 %
Eosinophils Absolute: 0.1 10*3/uL (ref 0.0–0.5)
Eosinophils Relative: 3 %
HCT: 34.2 % — ABNORMAL LOW (ref 36.0–46.0)
Hemoglobin: 10.7 g/dL — ABNORMAL LOW (ref 12.0–15.0)
Immature Granulocytes: 0 %
Lymphocytes Relative: 27 %
Lymphs Abs: 0.9 10*3/uL (ref 0.7–4.0)
MCH: 30.1 pg (ref 26.0–34.0)
MCHC: 31.3 g/dL (ref 30.0–36.0)
MCV: 96.3 fL (ref 80.0–100.0)
Monocytes Absolute: 0.4 10*3/uL (ref 0.1–1.0)
Monocytes Relative: 12 %
Neutro Abs: 1.9 10*3/uL (ref 1.7–7.7)
Neutrophils Relative %: 57 %
Platelets: 159 10*3/uL (ref 150–400)
RBC: 3.55 MIL/uL — ABNORMAL LOW (ref 3.87–5.11)
RDW: 15.4 % (ref 11.5–15.5)
WBC: 3.3 10*3/uL — ABNORMAL LOW (ref 4.0–10.5)
nRBC: 0 % (ref 0.0–0.2)

## 2019-04-02 LAB — MAGNESIUM: Magnesium: 1.7 mg/dL (ref 1.7–2.4)

## 2019-04-02 MED ORDER — PROCHLORPERAZINE MALEATE 10 MG PO TABS: 10.0000 mg | ORAL_TABLET | Freq: Once | ORAL | Status: DC

## 2019-04-02 MED ORDER — LENALIDOMIDE 5 MG PO CAPS: ORAL_CAPSULE | ORAL | 3 refills | Status: DC

## 2019-04-02 MED ORDER — BORTEZOMIB CHEMO SQ INJECTION 3.5 MG (2.5MG/ML)
1.3000 mg/m2 | Freq: Once | INTRAMUSCULAR | Status: AC
  Administered 2019-04-02: 3 mg via SUBCUTANEOUS
  Filled 2019-04-02: qty 1.2

## 2019-04-02 MED ORDER — ACYCLOVIR 400 MG PO TABS: 800.0000 mg | ORAL_TABLET | Freq: Two times a day (BID) | ORAL | 3 refills | Status: DC

## 2019-04-02 NOTE — Patient Instructions (Addendum)
[facility] at [facility] Discharge Instructions  You were seen today by Dr. Katragadda. He went over your recent lab results. He will see you back in 4 weeks for labs and follow up.   Thank you for choosing [facility] at [facility] to provide your oncology and hematology care.  To afford each patient quality time with our provider, please arrive at least 15 minutes before your scheduled appointment time.   If you have a lab appointment with the Cancer Center please come in thru the  Main Entrance and check in at the main information desk  You need to re-schedule your appointment should you arrive 10 or more minutes late.  We strive to give you quality time with our providers, and arriving late affects you and other patients whose appointments are after yours.  Also, if you no show three or more times for appointments you may be dismissed from the clinic at the providers discretion.     Again, thank you for choosing [facility].  Our hope is that these requests will decrease the amount of time that you wait before being seen by our physicians.       _____________________________________________________________  Should you have questions after your visit to [facility], please contact our office at [phone] between the hours of 8:00 a.m. and 4:30 p.m.  Voicemails left after 4:00 p.m. will not be returned until the following business day.  For prescription refill requests, have your pharmacy contact our office and allow 72 hours.    Cancer Center Support Programs:   > Cancer Support Group  2nd Tuesday of the month 1pm-2pm, Journey Room    

## 2019-04-02 NOTE — Progress Notes (Signed)
Pt presents today for f/u office visit and treatment. VS and labs within parameters for tx. Pt has no complaints of any changes since her last visit.   Velcade given today per MD orders. Tolerated infusion without adverse affects. Vital signs stable. No complaints at this time. Discharged from clinic ambulatory. F/U with [facility] as scheduled.

## 2019-04-02 NOTE — Progress Notes (Signed)
[place], [place] [address]   CLINIC:  Medical Oncology/Hematology  PCP:  Mikey Kirschner, MD [address] [place] [address] [phone]   REASON FOR VISIT:  Follow-up for multiple myeloma    BRIEF ONCOLOGIC HISTORY:  Oncology History  Multiple myeloma ([place])  10/29/2017 Initial Diagnosis   Multiple myeloma ([place])   11/01/2017 - 01/24/2018 Chemotherapy   The patient had bortezomib SQ (VELCADE) chemo injection 3 mg, 1.3 mg/m2 = 3 mg, Subcutaneous,  Once, 5 of 5 cycles Administration: 3 mg (11/01/2017), 3 mg (11/08/2017), 3 mg (11/05/2017), 3 mg (11/22/2017), 3 mg (11/12/2017), 3 mg (11/29/2017), 3 mg (11/26/2017), 3 mg (12/03/2017), 3 mg (12/13/2017), 3 mg (12/20/2017), 3 mg (12/17/2017), 3 mg (12/24/2017), 3 mg (01/03/2018), 3 mg (01/10/2018), 3 mg (01/07/2018), 3 mg (01/14/2018), 3 mg (01/24/2018)  for chemotherapy treatment.    06/11/2018 -  Chemotherapy   The patient had bortezomib SQ (VELCADE) chemo injection 3 mg, 1.3 mg/m2 = 3 mg, Subcutaneous,  Once, 24 of 29 cycles Administration: 3 mg (06/11/2018), 3 mg (07/29/2018), 3 mg (08/21/2018), 3 mg (09/04/2018), 3 mg (09/18/2018), 3 mg (10/02/2018), 3 mg (10/16/2018), 3 mg (10/29/2018), 3 mg (11/13/2018), 3 mg (06/18/2018), 3 mg (06/25/2018), 3 mg (07/01/2018), 3 mg (07/08/2018), 3 mg (07/15/2018), 3 mg (11/27/2018), 3 mg (12/11/2018), 3 mg (12/25/2018), 3 mg (01/08/2019), 3 mg (01/22/2019), 3 mg (02/05/2019), 3 mg (02/19/2019), 3 mg (03/05/2019), 3 mg (03/19/2019), 3 mg (04/02/2019)  for chemotherapy treatment.       CANCER STAGING: Cancer Staging Multiple myeloma ([place]) Staging form: Plasma Cell Myeloma and Plasma Cell Disorders, AJCC 8th Edition - Clinical: No stage assigned - Unsigned    INTERVAL HISTORY:  Ms. Kiger 53 y.o. female seen for follow-up of multiple myeloma.  She is tolerating Revlimid 5 mg 3 weeks on 1 week off very well.  Denies any worsening of neuropathy.  She is taking magnesium 2 tablets 3  times a day.  There was some changes to her cardiac medications by her cardiologist.  Bumex was dose reduced to half tablet daily.  Verapamil was discontinued and carvedilol dose was increased.  Feet swelling has been stable.  Appetite is 100%.  Energy levels are 75%.  She is continuing acyclovir twice daily.  REVIEW OF SYSTEMS:  Review of Systems  Cardiovascular: Positive for leg swelling.  All other systems reviewed and are negative.    PAST MEDICAL/SURGICAL HISTORY:  Past Medical History:  Diagnosis Date  . Acid reflux   . Allergic rhinitis   . Cancer ([place])    multiple myeloma  . Diabetes mellitus    type 2  . Gout   . Gout   . HBP (high blood pressure)   . History of kidney stones   . Migraines    Past Surgical History:  Procedure Laterality Date  . CESAREAN SECTION    . EXTRACORPOREAL SHOCK WAVE LITHOTRIPSY Left 10/10/2017   Procedure: LEFT EXTRACORPOREAL SHOCK WAVE LITHOTRIPSY (ESWL);  Surgeon: Bjorn Loser, MD;  Location: [facility];  Service: Urology;  Laterality: Left;  . EYE SURGERY    . HEMORRHOID SURGERY N/A 11/19/2012   Procedure: HEMORRHOIDECTOMY;  Surgeon: Jamesetta So, MD;  Location: [facility];  Service: General;  Laterality: N/A;  . kidney stones  1998  . LAPAROSCOPIC UNILATERAL SALPINGO OOPHERECTOMY  05/14/2012   Procedure: LAPAROSCOPIC UNILATERAL SALPINGO OOPHORECTOMY;  Surgeon: [name], MD;  Location: [facility];  Service: Gynecology;  Laterality: Right;  laparoscopic right salpingo-oophorectomy  . PARTIAL HYSTERECTOMY    . TONSILECTOMY, ADENOIDECTOMY, BILATERAL MYRINGOTOMY AND TUBES    . VESICOVAGINAL FISTULA CLOSURE W/ TAH       SOCIAL HISTORY:  Social History   Socioeconomic History  . Marital status: Married    Spouse name: Not on file  . Number of children: Not on file  . Years of education: 55  . Highest education level: Not on file  Occupational History    Employer: [employer]  . Financial resource strain: Not on file  . Food  insecurity    Worry: Never true    Inability: Never true  . Transportation needs    Medical: Not on file    Non-medical: Not on file  Tobacco Use  . Smoking status: Former Smoker    Quit date: 02/27/1999    Years since quitting: 20.1  . Smokeless tobacco: Never Used  . Tobacco comment: socially   Substance and Sexual Activity  . Alcohol use: No    Alcohol/week: 0.0 standard drinks  . Drug use: No  . Sexual activity: Yes  Lifestyle  . Physical activity    Days per week: Not on file    Minutes per session: Not on file  . Stress: Not on file  Relationships  . Social Herbalist on phone: Not on file    Gets together: Not on file    Attends religious service: Not on file    Active member of club or organization: Not on file    Attends meetings of clubs or organizations: Not on file    Relationship status: Not on file  . Intimate partner violence    Fear of current or ex partner: Not on file    Emotionally abused: Not on file    Physically abused: Not on file    Forced sexual activity: Not on file  Other Topics Concern  . Not on file  Social History Narrative  . Not on file    FAMILY HISTORY:  Family History  Problem Relation Age of Onset  . Arthritis Other   . Cancer Other   . Diabetes Other   . Hypertension Mother   . Dementia Mother   . Diabetes Father   . ALS Father   . Diabetes Brother   . Hypertension Brother   . Cancer Paternal Aunt   . COPD Maternal Grandmother   . Cancer Maternal Grandfather   . Anesthesia problems Paternal Grandfather     CURRENT MEDICATIONS:  Outpatient Encounter Medications as of 04/02/2019  Medication Sig Note  . acyclovir (ZOVIRAX) 400 MG tablet Take 2 tablets (800 mg total) by mouth 2 (two) times daily.   Marland Kitchen albuterol (PROVENTIL HFA;VENTOLIN HFA) 108 (90 Base) MCG/ACT inhaler Inhale 2 puffs into the lungs every 4 (four) hours as needed for wheezing or shortness of breath.   . allopurinol (ZYLOPRIM) 300 MG tablet Take one  daily   . aspirin EC 81 MG tablet Take 81 mg by mouth daily.   . Bortezomib (VELCADE IJ) Inject as directed every 14 (fourteen) days.  08/09/2018: Due for next injection on 08/13/2018  . bumetanide (BUMEX) 1 MG tablet Take 1 tablet (1 mg total) by mouth every morning.   . bumetanide (BUMEX) 1 MG tablet Take by mouth.   . carvedilol (COREG) 6.25 MG tablet Take 18.75 mg by mouth 2 (two) times daily with a meal. Pt is taking 3 tablets BID   . diazepam (VALIUM)  5 MG tablet TAKE 1 TABLET BY MOUTH AT BEDTIME   . Docosanol 10 % CREA Apply 0.5 inches topically 2 (two) times a day.   . docusate sodium (COLACE) 100 MG capsule Take 100 mg by mouth daily.    . fluticasone (FLONASE) 50 MCG/ACT nasal spray Place 2 sprays into the nose 2 (two) times daily.   Marland Kitchen HYDROcodone-acetaminophen (NORCO/VICODIN) 5-325 MG tablet Take 1 tablet by mouth at bedtime as needed for moderate pain.   Marland Kitchen lenalidomide (REVLIMID) 5 MG capsule Take 1 capsule (5 mg) for 21 days, then off for 7   . lenalidomide (REVLIMID) 5 MG capsule Take 1 capsule (5 mg) x 21 days on , then 7 days off   . magnesium oxide (MAG-OX) 400 (241.3 Mg) MG tablet Take 1 tablet (400 mg total) by mouth 3 (three) times daily.   . metFORMIN (GLUCOPHAGE) 500 MG tablet TAKE 1 TABLET BY MOUTH ONCE DAILY WITH BREAKFAST (Patient taking differently: Take 1,000 mg by mouth 2 (two) times daily with a meal. )   . Misc. Devices MISC Please administer recombinant zoster vaccine (Shingrix)   . omeprazole (PRILOSEC) 20 MG capsule Take 1 capsule (20 mg total) by mouth daily.   . ondansetron (ZOFRAN) 8 MG tablet Take 1 tablet (8 mg total) by mouth every 8 (eight) hours as needed.   . potassium chloride SA (K-DUR,KLOR-CON) 20 MEQ tablet Take 20 mEq by mouth daily.    . pregabalin (LYRICA) 200 MG capsule TAKE (1) CAPSULE BY MOUTH TWICE DAILY.   Marland Kitchen prochlorperazine (COMPAZINE) 10 MG tablet Take 1 tablet (10 mg total) by mouth every 6 (six) hours as needed for nausea or vomiting.  08/09/2018: Takes prior to Velcade injection  . rosuvastatin (CRESTOR) 5 MG tablet Take 5 mg by mouth daily.    . TRULICITY 4.65 KP/5.4SF SOPN Inject 0.75 mg into the skin every Monday.    . valACYclovir (VALTREX) 1000 MG tablet TAKE 2 TABLETS NOW, THEN 2 TABLETS 12 HOURS LATER.   . [DISCONTINUED] acyclovir (ZOVIRAX) 800 MG tablet Take 800 mg by mouth 2 (two) times daily.    . [DISCONTINUED] lenalidomide (REVLIMID) 10 MG capsule TAKE 1 CAPSULE BY MOUTH  DAILY FOR 21 DAYS ON, THEN  7 DAYS OFF (Patient not taking: Reported on 04/02/2019)   . [DISCONTINUED] lenalidomide (REVLIMID) 5 MG capsule Take 1 capsule (5 mg) x 21 days on , then 7 days off   . [DISCONTINUED] losartan (COZAAR) 100 MG tablet TAKE (1) TABLET BY MOUTH ONCE DAILY. (Patient not taking: Reported on 04/02/2019)   . [DISCONTINUED] verapamil (CALAN-SR) 240 MG CR tablet Take 1 tablet (240 mg total) by mouth at bedtime. (Patient not taking: Reported on 04/02/2019)    No facility-administered encounter medications on file as of 04/02/2019.     ALLERGIES:  No Known Allergies   PHYSICAL EXAM:  ECOG Performance status: 1  Vitals:   04/02/19 1052  BP: (!) 144/84  Pulse: 62  Resp: 18  Temp: (!) 97.4 F (36.3 C)  SpO2: 99%   Filed Weights   04/02/19 1052  Weight: 264 lb (119.7 kg)    Physical Exam Vitals signs reviewed.  Constitutional:      Appearance: Normal appearance.  Cardiovascular:     Rate and Rhythm: Normal rate and regular rhythm.     Heart sounds: Normal heart sounds.  Pulmonary:     Effort: Pulmonary effort is normal.     Breath sounds: Normal breath sounds.  Abdominal:  General: There is no distension.     Palpations: Abdomen is soft. There is no mass.  Musculoskeletal:        General: No swelling.  Skin:    General: Skin is warm.  Neurological:     General: No focal deficit present.     Mental Status: She is alert and oriented to person, place, and time.  Psychiatric:        Mood and Affect: Mood  normal.        Behavior: Behavior normal.      LABORATORY DATA:  I have reviewed the labs as listed.  CBC    Component Value Date/Time   WBC 3.3 (L) 04/02/2019 0944   RBC 3.55 (L) 04/02/2019 0944   HGB 10.7 (L) 04/02/2019 0944   HGB 14.2 08/02/2015 1113   HCT 34.2 (L) 04/02/2019 0944   HCT 41.1 08/02/2015 1113   PLT 159 04/02/2019 0944   PLT 371 08/02/2015 1113   MCV 96.3 04/02/2019 0944   MCV 84 08/02/2015 1113   MCH 30.1 04/02/2019 0944   MCHC 31.3 04/02/2019 0944   RDW 15.4 04/02/2019 0944   RDW 15.1 08/02/2015 1113   LYMPHSABS 0.9 04/02/2019 0944   LYMPHSABS 2.9 08/02/2015 1113   MONOABS 0.4 04/02/2019 0944   EOSABS 0.1 04/02/2019 0944   EOSABS 0.2 08/02/2015 1113   BASOSABS 0.0 04/02/2019 0944   BASOSABS 0.1 08/02/2015 1113   CMP Latest Ref Rng & Units 04/02/2019 03/19/2019 03/12/2019  Glucose 70 - 99 mg/dL 138(H) 121(H) 140(H)  BUN 6 - 20 mg/dL 21(H) 26(H) 19  Creatinine 0.44 - 1.00 mg/dL 1.12(H) 1.30(H) 1.21(H)  Sodium 135 - 145 mmol/L 138 138 137  Potassium 3.5 - 5.1 mmol/L 3.6 3.8 3.7  Chloride 98 - 111 mmol/L 105 104 107  CO2 22 - 32 mmol/L 24 24 22   Calcium 8.9 - 10.3 mg/dL 8.9 9.3 8.8(L)  Total Protein 6.5 - 8.1 g/dL 7.5 7.4 6.8  Total Bilirubin 0.3 - 1.2 mg/dL 0.6 0.6 0.8  Alkaline Phos 38 - 126 U/L 58 66 59  AST 15 - 41 U/L 28 29 21   ALT 0 - 44 U/L 22 18 18        DIAGNOSTIC IMAGING:  I have independently reviewed the scans and discussed with the patient.   I have reviewed Venita Lick LPN's note and agree with the documentation.  I personally performed a face-to-face visit, made revisions and my assessment and plan is as follows.    ASSESSMENT & PLAN:   Multiple myeloma ([place]) 1.  IgG lambda multiple myeloma, stage III, del 17p: - Presentation with lower back pain since March, presented to the emergency room on 10/25/2017 with right groin pain, CT scan showed multiple lytic lesions, multiple lytic lesions on skeletal survey - SPEP shows 1.5  g/dL of monoclonal protein, beta-2 microglobulin of 4.8, free lambda light chains of 2869, kappa by lambda ratio of less than 0, elevated LDH. 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  FISH panel shows -14, -12, 13 q.-/-13 and 17p-.  Bone marrow biopsy shows 90% plasma cells. -4 cycles of RVD from 11/01/2017 through 01/14/2018.  Labs done on 01/20/2018 showed M spike to be negative. - She was evaluated by Dr. Laverta Baltimore at [facility].  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 - Because of her high risk disease, consolidation with 2 cycles of chemotherapy was done with RVD from 06/11/2018 through 07/01/2018.  -Maintenance  Velcade every 2 weeks and Revlimid 10 mg 3 weeks on 1 week off started on 07/29/2018.   -Multiple myeloma labs from [facility] on 02/09/2019 were negative for SPEP, immunofixation.  Free light chain ratio was normal. - As her creatinine was high, we have dose reduced Revlimid to 5 mg 3 weeks on 1 week of last treatment. -She is tolerating it very well.  Creatinine improved to 1.14.  Bumex dose was decreased to half tablet daily.  Carvedilol was increased to 3 pills twice a day.  Verapamil was discontinued by her cardiologist. - She will continue Velcade every 2 weeks.  If her creatinine continues to be normal, we will consider increasing dose back to 10 mg of Revlimid. - We will see her back in 4 weeks for follow-up.   2.  Bone strengthening agents: -She is continuing Zometa every 3 months.  Last dose was 02/25/2019.  3.  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  She will continue Lyrica 200 mg twice daily.  4.  ID prophylaxis: -We will dose reduce acyclovir to 400 mg twice daily.  5.  Hypomagnesemia: -She will continue 2 tablets 3 times a day. -Magnesium improved to 1.7.    6.  Diabetes: -She will continue metformin thousand milligrams twice daily. -She is  taking Trulicity every week.     Total time spent is 25 minutes with more than 50% of the time spent face-to-face discussing treatment plan, counseling and coordination of care.  Orders placed this encounter:  No orders of the defined types were placed in this encounter.     Derek Jack, MD [place] [phone]

## 2019-04-02 NOTE — Patient Instructions (Signed)
[facility] Discharge Instructions for Patients Receiving Chemotherapy  Today you received the following chemotherapy agents   To help prevent nausea and vomiting after your treatment, we encourage you to take your nausea medication   If you develop nausea and vomiting that is not controlled by your nausea medication, call the clinic.   BELOW ARE SYMPTOMS THAT SHOULD BE REPORTED IMMEDIATELY:  *FEVER GREATER THAN 100.5 F  *CHILLS WITH OR WITHOUT FEVER  NAUSEA AND VOMITING THAT IS NOT CONTROLLED WITH YOUR NAUSEA MEDICATION  *UNUSUAL SHORTNESS OF BREATH  *UNUSUAL BRUISING OR BLEEDING  TENDERNESS IN MOUTH AND THROAT WITH OR WITHOUT PRESENCE OF ULCERS  *URINARY PROBLEMS  *BOWEL PROBLEMS  UNUSUAL RASH Items with * indicate a potential emergency and should be followed up as soon as possible.  Feel free to call the clinic should you have any questions or concerns. The clinic phone number is [phone].  Please show the CHEMO ALERT CARD at check-in to the Emergency Department and triage nurse.   

## 2019-04-05 ENCOUNTER — Encounter (HOSPITAL_COMMUNITY): Payer: Self-pay | Admitting: Hematology

## 2019-04-05 NOTE — Assessment & Plan Note (Addendum)
1.  IgG lambda multiple myeloma, stage III, del 17p: - Presentation with lower back pain since March, presented to the emergency room on 10/25/2017 with right groin pain, CT scan showed multiple lytic lesions, multiple lytic lesions on skeletal survey - SPEP shows 1.5 g/dL of monoclonal protein, beta-2 microglobulin of 4.8, free lambda light chains of 2869, kappa by lambda ratio of less than 0, elevated LDH. 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  FISH panel shows -14, -12, 13 q.-/-13 and 17p-.  Bone marrow biopsy shows 90% plasma cells. -4 cycles of RVD from 11/01/2017 through 01/14/2018.  Labs done on 01/20/2018 showed M spike to be negative. - She was evaluated by Dr. Laverta Baltimore at [facility].  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 - Because of her high risk disease, consolidation with 2 cycles of chemotherapy was done with RVD from 06/11/2018 through 07/01/2018.  -Maintenance Velcade every 2 weeks and Revlimid 10 mg 3 weeks on 1 week off started on 07/29/2018.   -Multiple myeloma labs from [facility] on 02/09/2019 were negative for SPEP, immunofixation.  Free light chain ratio was normal. - As her creatinine was high, we have dose reduced Revlimid to 5 mg 3 weeks on 1 week of last treatment. -She is tolerating it very well.  Creatinine improved to 1.14.  Bumex dose was decreased to half tablet daily.  Carvedilol was increased to 3 pills twice a day.  Verapamil was discontinued by her cardiologist. - She will continue Velcade every 2 weeks.  If her creatinine continues to be normal, we will consider increasing dose back to 10 mg of Revlimid. - We will see her back in 4 weeks for follow-up.   2.  Bone strengthening agents: -She is continuing Zometa every 3 months.  Last dose was 02/25/2019.  3.  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  She will continue Lyrica 200 mg twice  daily.  4.  ID prophylaxis: -We will dose reduce acyclovir to 400 mg twice daily.  5.  Hypomagnesemia: -She will continue 2 tablets 3 times a day. -Magnesium improved to 1.7.    6.  Diabetes: -She will continue metformin thousand milligrams twice daily. -She is taking Trulicity every week.

## 2019-04-06 ENCOUNTER — Other Ambulatory Visit: Payer: Self-pay | Admitting: Family Medicine

## 2019-04-08 ENCOUNTER — Other Ambulatory Visit (HOSPITAL_COMMUNITY): Payer: Self-pay | Admitting: Hematology

## 2019-04-08 ENCOUNTER — Telehealth: Payer: Self-pay | Admitting: Family Medicine

## 2019-04-08 NOTE — Telephone Encounter (Signed)
6 months worth

## 2019-04-08 NOTE — Telephone Encounter (Signed)
Refills called into pharm since they did not receive the fax last week and pt was notified.

## 2019-04-08 NOTE — Telephone Encounter (Signed)
Pt calling to check on refill for her diazepam (VALIUM) 5 MG tablet  [facility] states they've not received     [facility]    Please advise & call pt

## 2019-04-16 ENCOUNTER — Inpatient Hospital Stay (HOSPITAL_COMMUNITY): Payer: BC Managed Care – PPO | Attending: Hematology

## 2019-04-16 ENCOUNTER — Inpatient Hospital Stay (HOSPITAL_COMMUNITY): Payer: BC Managed Care – PPO

## 2019-04-16 ENCOUNTER — Other Ambulatory Visit: Payer: Self-pay

## 2019-04-16 ENCOUNTER — Encounter (HOSPITAL_COMMUNITY): Payer: Self-pay

## 2019-04-16 VITALS — BP 143/78 | HR 58 | Temp 97.9°F | Resp 18 | Wt 262.2 lb

## 2019-04-16 DIAGNOSIS — Z5112 Encounter for antineoplastic immunotherapy: Secondary | ICD-10-CM | POA: Diagnosis present

## 2019-04-16 DIAGNOSIS — Z23 Encounter for immunization: Secondary | ICD-10-CM | POA: Diagnosis not present

## 2019-04-16 DIAGNOSIS — C9000 Multiple myeloma not having achieved remission: Secondary | ICD-10-CM | POA: Diagnosis not present

## 2019-04-16 DIAGNOSIS — C9001 Multiple myeloma in remission: Secondary | ICD-10-CM

## 2019-04-16 LAB — CBC WITH DIFFERENTIAL/PLATELET
Abs Immature Granulocytes: 0 10*3/uL (ref 0.00–0.07)
Basophils Absolute: 0 10*3/uL (ref 0.0–0.1)
Basophils Relative: 1 %
Eosinophils Absolute: 0 10*3/uL (ref 0.0–0.5)
Eosinophils Relative: 2 %
HCT: 33.2 % — ABNORMAL LOW (ref 36.0–46.0)
Hemoglobin: 10.5 g/dL — ABNORMAL LOW (ref 12.0–15.0)
Immature Granulocytes: 0 %
Lymphocytes Relative: 34 %
Lymphs Abs: 0.9 10*3/uL (ref 0.7–4.0)
MCH: 30.1 pg (ref 26.0–34.0)
MCHC: 31.6 g/dL (ref 30.0–36.0)
MCV: 95.1 fL (ref 80.0–100.0)
Monocytes Absolute: 0.4 10*3/uL (ref 0.1–1.0)
Monocytes Relative: 14 %
Neutro Abs: 1.3 10*3/uL — ABNORMAL LOW (ref 1.7–7.7)
Neutrophils Relative %: 49 %
Platelets: 166 10*3/uL (ref 150–400)
RBC: 3.49 MIL/uL — ABNORMAL LOW (ref 3.87–5.11)
RDW: 15.4 % (ref 11.5–15.5)
WBC: 2.6 10*3/uL — ABNORMAL LOW (ref 4.0–10.5)
nRBC: 0 % (ref 0.0–0.2)

## 2019-04-16 LAB — COMPREHENSIVE METABOLIC PANEL
ALT: 19 U/L (ref 0–44)
AST: 21 U/L (ref 15–41)
Albumin: 3.9 g/dL (ref 3.5–5.0)
Alkaline Phosphatase: 60 U/L (ref 38–126)
Anion gap: 10 (ref 5–15)
BUN: 19 mg/dL (ref 6–20)
CO2: 25 mmol/L (ref 22–32)
Calcium: 9.2 mg/dL (ref 8.9–10.3)
Chloride: 105 mmol/L (ref 98–111)
Creatinine, Ser: 1.02 mg/dL — ABNORMAL HIGH (ref 0.44–1.00)
GFR calc Af Amer: >60 mL/min (ref >60)
GFR calc non Af Amer: >60 mL/min (ref >60)
Glucose, Bld: 139 mg/dL — ABNORMAL HIGH (ref 70–99)
Potassium: 4.1 mmol/L (ref 3.5–5.1)
Sodium: 140 mmol/L (ref 135–145)
Total Bilirubin: 0.4 mg/dL (ref 0.3–1.2)
Total Protein: 7 g/dL (ref 6.5–8.1)

## 2019-04-16 LAB — MAGNESIUM: Magnesium: 1.7 mg/dL (ref 1.7–2.4)

## 2019-04-16 MED ORDER — PROCHLORPERAZINE MALEATE 10 MG PO TABS: 10.0000 mg | ORAL_TABLET | Freq: Four times a day (QID) | ORAL | 2 refills | Status: DC | PRN

## 2019-04-16 MED ORDER — ONDANSETRON HCL 8 MG PO TABS: 8.0000 mg | ORAL_TABLET | Freq: Three times a day (TID) | ORAL | 3 refills | Status: DC | PRN

## 2019-04-16 MED ORDER — PROCHLORPERAZINE MALEATE 10 MG PO TABS: 10.0000 mg | ORAL_TABLET | Freq: Once | ORAL | Status: DC

## 2019-04-16 MED ORDER — INFLUENZA VAC SPLIT QUAD 0.5 ML IM SUSY
0.5000 mL | PREFILLED_SYRINGE | Freq: Once | INTRAMUSCULAR | Status: AC
  Administered 2019-04-16: 0.5 mL via INTRAMUSCULAR

## 2019-04-16 MED ORDER — BORTEZOMIB CHEMO SQ INJECTION 3.5 MG (2.5MG/ML)
1.3000 mg/m2 | Freq: Once | INTRAMUSCULAR | Status: AC
  Administered 2019-04-16: 3 mg via SUBCUTANEOUS
  Filled 2019-04-16: qty 1.2

## 2019-04-16 MED ORDER — INFLUENZA VAC SPLIT QUAD 0.5 ML IM SUSY
PREFILLED_SYRINGE | INTRAMUSCULAR | Status: AC
  Filled 2019-04-16: qty 0.5

## 2019-04-16 NOTE — Progress Notes (Signed)
32 Labs reviewed with Dr. Delton Coombes and pt approved for Velcade injection today per MD                                           Joanna Reid tolerated Velcade injection and Influenza vaccine well without complaints or incident. VSS Pt discharged self ambulatory in satisfactory condition

## 2019-04-16 NOTE — Patient Instructions (Addendum)
[facility] Discharge Instructions for Patients Receiving Chemotherapy   Beginning January 23rd 2017 lab work for the [facility] will be done in the  Main lab at [facility] on 1st floor. If you have a lab appointment with the [facility] please come in thru the  Main Entrance and check in at the main information desk   Today you received the following chemotherapy agents Velcade injection as well as Influenza vaccine. Follow-up as scheduled. Call clinic for any questions or concerns  To help prevent nausea and vomiting after your treatment, we encourage you to take your nausea medication   If you develop nausea and vomiting, or diarrhea that is not controlled by your medication, call the clinic.  The clinic phone number is [phone]. Office hours are Monday-Friday 8:30am-5:00pm.  BELOW ARE SYMPTOMS THAT SHOULD BE REPORTED IMMEDIATELY:  *FEVER GREATER THAN 101.0 F  *CHILLS WITH OR WITHOUT FEVER  NAUSEA AND VOMITING THAT IS NOT CONTROLLED WITH YOUR NAUSEA MEDICATION  *UNUSUAL SHORTNESS OF BREATH  *UNUSUAL BRUISING OR BLEEDING  TENDERNESS IN MOUTH AND THROAT WITH OR WITHOUT PRESENCE OF ULCERS  *URINARY PROBLEMS  *BOWEL PROBLEMS  UNUSUAL RASH Items with * indicate a potential emergency and should be followed up as soon as possible. If you have an emergency after office hours please contact your primary care physician or go to the nearest emergency department.  Please call the clinic during office hours if you have any questions or concerns.   You may also contact the Patient Navigator at [phone] should you have any questions or need assistance in obtaining follow up care.      Resources For Cancer Patients and their Caregivers ? American Cancer Society: Can assist with transportation, wigs, general needs, runs Look Good Feel Better.        ([phone] ? Cancer Care: Provides financial assistance, online support groups,  medication/co-pay assistance.  1-800-813-HOPE 9033838905) ? [facility] Assists [place] cancer patients and their families through emotional , educational and financial support.  585-343-8622 ? [place] DSS Where to apply for food stamps, Medicaid and utility assistance. (225) 364-2245 ? [facility]: Transportation to medical appointments. (906)549-8108 ? Social Security Administration: May apply for disability if have a Stage IV cancer. 4236255152 281-656-9937 ? [employer], Disability and Transit Services: Assists with nutrition, care and transit needs. [phone]

## 2019-04-21 ENCOUNTER — Telehealth: Payer: Self-pay | Admitting: Family Medicine

## 2019-04-21 MED ORDER — FLUTICASONE PROPIONATE 50 MCG/ACT NA SUSP: NASAL | 3 refills | Status: DC

## 2019-04-21 NOTE — Telephone Encounter (Signed)
Please advise. Thank you

## 2019-04-21 NOTE — Telephone Encounter (Signed)
Medication refills sent to [facility]. Pt contacted and informed. Pt verbalized understanding.

## 2019-04-21 NOTE — Telephone Encounter (Signed)
Pt needs refill on fluticasone (FLONASE) 50 MCG/ACT nasal spray  Need 90 day supply ordered through [facility]  Please advise & call pt when done

## 2019-04-21 NOTE — Telephone Encounter (Signed)
Ok plus 3 ref 

## 2019-04-29 ENCOUNTER — Other Ambulatory Visit: Payer: Self-pay

## 2019-04-30 ENCOUNTER — Inpatient Hospital Stay (HOSPITAL_COMMUNITY): Payer: BC Managed Care – PPO

## 2019-04-30 ENCOUNTER — Encounter (HOSPITAL_COMMUNITY): Payer: Self-pay | Admitting: Hematology

## 2019-04-30 ENCOUNTER — Inpatient Hospital Stay (HOSPITAL_COMMUNITY): Payer: BC Managed Care – PPO | Admitting: Hematology

## 2019-04-30 VITALS — BP 130/77 | HR 66 | Temp 97.9°F | Resp 16 | Wt 257.3 lb

## 2019-04-30 DIAGNOSIS — C9000 Multiple myeloma not having achieved remission: Secondary | ICD-10-CM

## 2019-04-30 LAB — CBC WITH DIFFERENTIAL/PLATELET
Abs Immature Granulocytes: 0 10*3/uL (ref 0.00–0.07)
Basophils Absolute: 0 10*3/uL (ref 0.0–0.1)
Basophils Relative: 1 %
Eosinophils Absolute: 0.1 10*3/uL (ref 0.0–0.5)
Eosinophils Relative: 2 %
HCT: 34.6 % — ABNORMAL LOW (ref 36.0–46.0)
Hemoglobin: 10.9 g/dL — ABNORMAL LOW (ref 12.0–15.0)
Immature Granulocytes: 0 %
Lymphocytes Relative: 26 %
Lymphs Abs: 0.9 10*3/uL (ref 0.7–4.0)
MCH: 29.6 pg (ref 26.0–34.0)
MCHC: 31.5 g/dL (ref 30.0–36.0)
MCV: 94 fL (ref 80.0–100.0)
Monocytes Absolute: 0.4 10*3/uL (ref 0.1–1.0)
Monocytes Relative: 12 %
Neutro Abs: 1.9 10*3/uL (ref 1.7–7.7)
Neutrophils Relative %: 59 %
Platelets: 174 10*3/uL (ref 150–400)
RBC: 3.68 MIL/uL — ABNORMAL LOW (ref 3.87–5.11)
RDW: 15 % (ref 11.5–15.5)
WBC: 3.3 10*3/uL — ABNORMAL LOW (ref 4.0–10.5)
nRBC: 0 % (ref 0.0–0.2)

## 2019-04-30 LAB — COMPREHENSIVE METABOLIC PANEL
ALT: 18 U/L (ref 0–44)
AST: 22 U/L (ref 15–41)
Albumin: 4.1 g/dL (ref 3.5–5.0)
Alkaline Phosphatase: 63 U/L (ref 38–126)
Anion gap: 13 (ref 5–15)
BUN: 19 mg/dL (ref 6–20)
CO2: 24 mmol/L (ref 22–32)
Calcium: 9.5 mg/dL (ref 8.9–10.3)
Chloride: 104 mmol/L (ref 98–111)
Creatinine, Ser: 1.03 mg/dL — ABNORMAL HIGH (ref 0.44–1.00)
GFR calc Af Amer: >60 mL/min (ref >60)
GFR calc non Af Amer: >60 mL/min (ref >60)
Glucose, Bld: 129 mg/dL — ABNORMAL HIGH (ref 70–99)
Potassium: 3.5 mmol/L (ref 3.5–5.1)
Sodium: 141 mmol/L (ref 135–145)
Total Bilirubin: 0.6 mg/dL (ref 0.3–1.2)
Total Protein: 7.2 g/dL (ref 6.5–8.1)

## 2019-04-30 MED ORDER — FLUCONAZOLE 200 MG PO TABS: 200.0000 mg | ORAL_TABLET | Freq: Every day | ORAL | 3 refills | Status: DC

## 2019-04-30 MED ORDER — BORTEZOMIB CHEMO SQ INJECTION 3.5 MG (2.5MG/ML)
1.3000 mg/m2 | Freq: Once | INTRAMUSCULAR | Status: AC
  Administered 2019-04-30: 3 mg via SUBCUTANEOUS
  Filled 2019-04-30: qty 1.2

## 2019-04-30 MED ORDER — PROCHLORPERAZINE MALEATE 10 MG PO TABS: 10.0000 mg | ORAL_TABLET | Freq: Once | ORAL | Status: DC

## 2019-04-30 NOTE — Progress Notes (Signed)
1103 

## 2019-04-30 NOTE — Progress Notes (Signed)
[id number] Labs reviewed with and pt seen by Dr. Delton Coombes and pt approved for Velcade injection today per MD                [name] tolerated Velcade injection well without complaints or incident.VSS Pt discharged self ambulatory in satisfactory condition

## 2019-04-30 NOTE — Patient Instructions (Signed)
[facility] Discharge Instructions for Patients Receiving Chemotherapy   Beginning January 23rd 2017 lab work for the Cancer Center will be done in the  Main lab at [facility] on 1st floor. If you have a lab appointment with the Cancer Center please come in thru the  Main Entrance and check in at the main information desk   Today you received the following chemotherapy agents Velcade injection. Follow-up as scheduled. Call clinic for any questions or concerns  To help prevent nausea and vomiting after your treatment, we encourage you to take your nausea medication   If you develop nausea and vomiting, or diarrhea that is not controlled by your medication, call the clinic.  The clinic phone number is [phone]. Office hours are Monday-Friday 8:30am-5:00pm.  BELOW ARE SYMPTOMS THAT SHOULD BE REPORTED IMMEDIATELY:  *FEVER GREATER THAN 101.0 F  *CHILLS WITH OR WITHOUT FEVER  NAUSEA AND VOMITING THAT IS NOT CONTROLLED WITH YOUR NAUSEA MEDICATION  *UNUSUAL SHORTNESS OF BREATH  *UNUSUAL BRUISING OR BLEEDING  TENDERNESS IN MOUTH AND THROAT WITH OR WITHOUT PRESENCE OF ULCERS  *URINARY PROBLEMS  *BOWEL PROBLEMS  UNUSUAL RASH Items with * indicate a potential emergency and should be followed up as soon as possible. If you have an emergency after office hours please contact your primary care physician or go to the nearest emergency department.  Please call the clinic during office hours if you have any questions or concerns.   You may also contact the Patient Navigator at [phone] should you have any questions or need assistance in obtaining follow up care.      Resources For Cancer Patients and their Caregivers ? American Cancer Society: Can assist with transportation, wigs, general needs, runs Look Good Feel Better.        1-888-227-6333 ? Cancer Care: Provides financial assistance, online support groups, medication/co-pay assistance.   1-800-813-HOPE (4673) ? [facility] Assists [place] cancer patients and their families through emotional , educational and financial support.  336-427-4357 ? [place] DSS Where to apply for food stamps, Medicaid and utility assistance. 336-342-1394 ? RCATS: Transportation to medical appointments. 336-347-2287 ? Social Security Administration: May apply for disability if have a Stage IV cancer. 336-342-7796 1-800-772-1213 ? [place] Aging, Disability and Transit Services: Assists with nutrition, care and transit needs. 336-349-2343         

## 2019-04-30 NOTE — Patient Instructions (Addendum)
[facility] at [facility] Discharge Instructions  You were seen today by Dr. Delton Coombes. He discussed how you have been doing.  He reviewed your labs and discussed increasing your revlimid to 10 mg.  He wants you to complete this cycle with 5 mg and then after your week off, then increase to 10 mg.  He wants to see you back in 4 weeks with labs and follow up.   Received Velcade injection today as well  Thank you for choosing [place] [facility] to provide your oncology and hematology care.  To afford each patient quality time with our provider, please arrive at least 15 minutes before your scheduled appointment time.   If you have a lab appointment with the [facility] please come in thru the Main Entrance and check in at the main information desk.  You need to re-schedule your appointment should you arrive 10 or more minutes late.  We strive to give you quality time with our providers, and arriving late affects you and other patients whose appointments are after yours.  Also, if you no show three or more times for appointments you may be dismissed from the clinic at the providers discretion.     Again, thank you for choosing [facility].  Our hope is that these requests will decrease the amount of time that you wait before being seen by our physicians.       _____________________________________________________________  Should you have questions after your visit to [facility], please contact our office at [phone] between the hours of 8:00 a.m. and 4:30 p.m.  Voicemails left after 4:00 p.m. will not be returned until the following business day.  For prescription refill requests, have your pharmacy contact our office and allow 72 hours.    Due to Covid, you will need to wear a mask upon entering the hospital. If you do not have a mask, a mask will be given to you at the Main Entrance upon arrival. For doctor  visits, patients may have 1 support person with them. For treatment visits, patients can not have anyone with them due to social distancing guidelines and our immunocompromised population.

## 2019-05-07 ENCOUNTER — Other Ambulatory Visit (HOSPITAL_COMMUNITY): Payer: Self-pay | Admitting: Hematology

## 2019-05-08 ENCOUNTER — Other Ambulatory Visit (HOSPITAL_COMMUNITY): Payer: Self-pay | Admitting: Hematology

## 2019-05-08 MED ORDER — LENALIDOMIDE 10 MG PO CAPS: ORAL_CAPSULE | ORAL | 1 refills | Status: DC

## 2019-05-08 NOTE — Assessment & Plan Note (Signed)
1.  IgG lambda multiple myeloma, stage III, del 17p: - Presentation with lower back pain since March, presented to the emergency room on 10/25/2017 with right groin pain, CT scan showed multiple lytic lesions, multiple lytic lesions on skeletal survey - SPEP shows 1.5 g/dL of monoclonal protein, beta-2 microglobulin of 4.8, free lambda light chains of 2869, kappa by lambda ratio of less than 0, elevated LDH. 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  FISH panel shows -14, -12, 13 q.-/-13 and 17p-.  Bone marrow biopsy shows 90% plasma cells. -4 cycles of RVD from 11/01/2017 through 01/14/2018.  Labs done on 01/20/2018 showed M spike to be negative. - She was evaluated by Dr. Laverta Baltimore at [facility].  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 - Because of her high risk disease, consolidation with 2 cycles of chemotherapy was done with RVD from 06/11/2018 through 07/01/2018.  -Maintenance Velcade every 2 weeks and Revlimid 10 mg 3 weeks on 1 week off started on 07/29/2018.   -Multiple myeloma labs from [place] on 02/09/2019 were negative for SPEP, immunofixation.  Free light chain ratio was normal. - As her creatinine was high, we have dose reduced Revlimid to 5 mg 3 weeks on 1 week of last treatment. -Creatinine today improved to 1.03.  Hence I will increase Revlimid to 10 mg 3 weeks on 1 week off. -She is tolerating Revlimid 5 mg 3 weeks on 1 week off very well.  I will reevaluate her in 1 month.   2.  Bone strengthening agents: -She is continuing Zometa every 3 months.  Last dose was 02/25/2019.  3.  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  She will continue Lyrica 200 mg twice daily.  4.  ID prophylaxis: -We will dose reduce acyclovir to 400 mg twice daily.  5.  Hypomagnesemia: -She will continue magnesium 2 tablets 3 times a day..    6.  Diabetes: -She will continue  metformin thousand milligrams twice daily. -She is taking Trulicity every week.

## 2019-05-08 NOTE — Progress Notes (Signed)
Joanna Reid, [place] [id number]   CLINIC:  Medical Oncology/Hematology  PCP:  Mikey Kirschner, MD [address] [place] [address] [phone]   REASON FOR VISIT:  Follow-up for multiple myeloma    BRIEF ONCOLOGIC HISTORY:  Oncology History  Multiple myeloma ([place])  10/29/2017 Initial Diagnosis   Multiple myeloma ([place])   11/01/2017 - 01/24/2018 Chemotherapy   The patient had bortezomib SQ (VELCADE) chemo injection 3 mg, 1.3 mg/m2 = 3 mg, Subcutaneous,  Once, 5 of 5 cycles Administration: 3 mg (11/01/2017), 3 mg (11/08/2017), 3 mg (11/05/2017), 3 mg (11/22/2017), 3 mg (11/12/2017), 3 mg (11/29/2017), 3 mg (11/26/2017), 3 mg (12/03/2017), 3 mg (12/13/2017), 3 mg (12/20/2017), 3 mg (12/17/2017), 3 mg (12/24/2017), 3 mg (01/03/2018), 3 mg (01/10/2018), 3 mg (01/07/2018), 3 mg (01/14/2018), 3 mg (01/24/2018)  for chemotherapy treatment.    06/11/2018 -  Chemotherapy   The patient had bortezomib SQ (VELCADE) chemo injection 3 mg, 1.3 mg/m2 = 3 mg, Subcutaneous,  Once, 26 of 29 cycles Administration: 3 mg (06/11/2018), 3 mg (07/29/2018), 3 mg (08/21/2018), 3 mg (09/04/2018), 3 mg (09/18/2018), 3 mg (10/02/2018), 3 mg (10/16/2018), 3 mg (10/29/2018), 3 mg (11/13/2018), 3 mg (06/18/2018), 3 mg (06/25/2018), 3 mg (07/01/2018), 3 mg (07/08/2018), 3 mg (07/15/2018), 3 mg (11/27/2018), 3 mg (12/11/2018), 3 mg (12/25/2018), 3 mg (01/08/2019), 3 mg (01/22/2019), 3 mg (02/05/2019), 3 mg (02/19/2019), 3 mg (03/05/2019), 3 mg (03/19/2019), 3 mg (04/02/2019), 3 mg (04/16/2019)  for chemotherapy treatment.       CANCER STAGING: Cancer Staging Multiple myeloma ([place]) Staging form: Plasma Cell Myeloma and Plasma Cell Disorders, AJCC 8th Edition - Clinical: No stage assigned - Unsigned    INTERVAL HISTORY:  Joanna Reid 53 y.o. female seen for follow-up of multiple myeloma.  She is taking Revlimid 5 mg 3 weeks on 1 week off.  Appetite and energy levels are 100%.  Denies any nausea, vomiting,  diarrhea or constipation.  Neuropathy is well controlled with Lyrica.  Denies any new onset pains.  Denies any fevers or infections.  No ER visits or hospitalizations.  She lost about 5 pounds since last visit and was trying to lose weight.  REVIEW OF SYSTEMS:  Review of Systems  Cardiovascular: Negative for leg swelling.  All other systems reviewed and are negative.    PAST MEDICAL/SURGICAL HISTORY:  Past Medical History:  Diagnosis Date  . Acid reflux   . Allergic rhinitis   . Cancer ([place])    multiple myeloma  . Diabetes mellitus    type 2  . Gout   . Gout   . HBP (high blood pressure)   . History of kidney stones   . Migraines    Past Surgical History:  Procedure Laterality Date  . CESAREAN SECTION    . EXTRACORPOREAL SHOCK WAVE LITHOTRIPSY Left 10/10/2017   Procedure: LEFT EXTRACORPOREAL SHOCK WAVE LITHOTRIPSY (ESWL);  Surgeon: Bjorn Loser, MD;  Location: [facility];  Service: Urology;  Laterality: Left;  . EYE SURGERY    . HEMORRHOID SURGERY N/A 11/19/2012   Procedure: HEMORRHOIDECTOMY;  Surgeon: Jamesetta So, MD;  Location: [facility] ORS;  Service: General;  Laterality: N/A;  . kidney stones  1998  . LAPAROSCOPIC UNILATERAL SALPINGO OOPHERECTOMY  05/14/2012   Procedure: LAPAROSCOPIC UNILATERAL SALPINGO OOPHORECTOMY;  Surgeon: [name], MD;  Location: [facility] ORS;  Service: Gynecology;  Laterality: Right;  laparoscopic right salpingo-oophorectomy  . PARTIAL HYSTERECTOMY    . TONSILECTOMY, ADENOIDECTOMY,  BILATERAL MYRINGOTOMY AND TUBES    . VESICOVAGINAL FISTULA CLOSURE W/ TAH       SOCIAL HISTORY:  Social History   Socioeconomic History  . Marital status: Married    Spouse name: Not on file  . Number of children: Not on file  . Years of education: 42  . Highest education level: Not on file  Occupational History    Employer: [employer]  . Financial resource strain: Not on file  . Food insecurity    Worry: Never true    Inability: Never true  .  Transportation needs    Medical: Not on file    Non-medical: Not on file  Tobacco Use  . Smoking status: Former Smoker    Quit date: 02/27/1999    Years since quitting: 20.2  . Smokeless tobacco: Never Used  . Tobacco comment: socially   Substance and Sexual Activity  . Alcohol use: No    Alcohol/week: 0.0 standard drinks  . Drug use: No  . Sexual activity: Yes  Lifestyle  . Physical activity    Days per week: Not on file    Minutes per session: Not on file  . Stress: Not on file  Relationships  . Social Herbalist on phone: Not on file    Gets together: Not on file    Attends religious service: Not on file    Active member of club or organization: Not on file    Attends meetings of clubs or organizations: Not on file    Relationship status: Not on file  . Intimate partner violence    Fear of current or ex partner: Not on file    Emotionally abused: Not on file    Physically abused: Not on file    Forced sexual activity: Not on file  Other Topics Concern  . Not on file  Social History Narrative  . Not on file    FAMILY HISTORY:  Family History  Problem Relation Age of Onset  . Arthritis Other   . Cancer Other   . Diabetes Other   . Hypertension Mother   . Dementia Mother   . Diabetes Father   . ALS Father   . Diabetes Brother   . Hypertension Brother   . Cancer Paternal Aunt   . COPD Maternal Grandmother   . Cancer Maternal Grandfather   . Anesthesia problems Paternal Grandfather     CURRENT MEDICATIONS:  Outpatient Encounter Medications as of 04/30/2019  Medication Sig Note  . acyclovir (ZOVIRAX) 400 MG tablet Take 2 tablets (800 mg total) by mouth 2 (two) times daily.   Marland Kitchen albuterol (PROVENTIL HFA;VENTOLIN HFA) 108 (90 Base) MCG/ACT inhaler Inhale 2 puffs into the lungs every 4 (four) hours as needed for wheezing or shortness of breath.   . allopurinol (ZYLOPRIM) 300 MG tablet Take one daily   . aspirin EC 81 MG tablet Take 81 mg by mouth daily.    . Bortezomib (VELCADE IJ) Inject as directed every 14 (fourteen) days.  08/09/2018: Due for next injection on 08/13/2018  . bumetanide (BUMEX) 1 MG tablet Take 1 tablet (1 mg total) by mouth every morning. (Patient taking differently: Take 0.5 mg by mouth every morning. )   . diazepam (VALIUM) 5 MG tablet TAKE 1 TABLET BY MOUTH AT BEDTIME   . Docosanol 10 % CREA Apply 0.5 inches topically 2 (two) times a day.   . docusate sodium (COLACE) 100 MG capsule Take 100 mg by  mouth daily.    . fluticasone (FLONASE) 50 MCG/ACT nasal spray Place 2 sprays into the nose 2 (two) times daily.   Marland Kitchen losartan (COZAAR) 25 MG tablet Take by mouth.   . magnesium oxide (MAG-OX) 400 (241.3 Mg) MG tablet Take 1 tablet (400 mg total) by mouth 3 (three) times daily.   . metFORMIN (GLUCOPHAGE) 500 MG tablet TAKE 1 TABLET BY MOUTH ONCE DAILY WITH BREAKFAST (Patient taking differently: Take 1,000 mg by mouth 2 (two) times daily with a meal. )   . omeprazole (PRILOSEC) 20 MG capsule Take 1 capsule (20 mg total) by mouth daily.   . potassium chloride SA (K-DUR) 20 MEQ tablet TAKE (1) TABLET BY MOUTH TWICE DAILY.   Marland Kitchen pregabalin (LYRICA) 200 MG capsule TAKE (1) CAPSULE BY MOUTH TWICE DAILY.   . rosuvastatin (CRESTOR) 5 MG tablet Take 5 mg by mouth daily.    . TRULICITY 5.00 XF/8.1WE SOPN Inject 0.75 mg into the skin every Monday.    . valACYclovir (VALTREX) 1000 MG tablet TAKE 2 TABLETS NOW, THEN 2 TABLETS 12 HOURS LATER.   . [DISCONTINUED] carvedilol (COREG) 25 MG tablet Take by mouth.   . [DISCONTINUED] lenalidomide (REVLIMID) 5 MG capsule Take 1 capsule (5 mg) for 21 days, then off for 7   . [DISCONTINUED] Misc. Devices MISC Please administer recombinant zoster vaccine (Shingrix)   . amoxicillin (AMOXIL) 500 MG capsule Take 500 mg by mouth 4 (four) times daily.   . fluconazole (DIFLUCAN) 200 MG tablet Take 1 tablet (200 mg total) by mouth daily.   Marland Kitchen HYDROcodone-acetaminophen (NORCO/VICODIN) 5-325 MG tablet Take 1 tablet by  mouth at bedtime as needed for moderate pain. (Patient not taking: Reported on 04/30/2019)   . ondansetron (ZOFRAN) 8 MG tablet Take 1 tablet (8 mg total) by mouth every 8 (eight) hours as needed. (Patient not taking: Reported on 04/30/2019)   . prochlorperazine (COMPAZINE) 10 MG tablet Take 1 tablet (10 mg total) by mouth every 6 (six) hours as needed for nausea or vomiting. (Patient not taking: Reported on 04/30/2019)   . [DISCONTINUED] bumetanide (BUMEX) 1 MG tablet Take by mouth.   . [DISCONTINUED] carvedilol (COREG) 6.25 MG tablet Take 18.75 mg by mouth 2 (two) times daily with a meal. Pt is taking 3 tablets BID   . [DISCONTINUED] lenalidomide (REVLIMID) 5 MG capsule Take 1 capsule (5 mg) x 21 days on , then 7 days off   . [DISCONTINUED] REVLIMID 5 MG capsule TAKE 1 CAPSULE BY MOUTH  DAILY FOR 21 DAYS ON, THEN  7 DAYS OFF    Facility-Administered Encounter Medications as of 04/30/2019  Medication  . [COMPLETED] bortezomib SQ (VELCADE) chemo injection 3 mg  . prochlorperazine (COMPAZINE) tablet 10 mg    ALLERGIES:  No Known Allergies   PHYSICAL EXAM:  ECOG Performance status: 1  Vitals:   04/30/19 1000  BP: 130/77  Pulse: 66  Resp: 16  Temp: 97.9 F (36.6 C)  SpO2: 99%   Filed Weights   04/30/19 1000  Weight: 257 lb 5 oz (116.7 kg)    Physical Exam Vitals signs reviewed.  Constitutional:      Appearance: Normal appearance.  Cardiovascular:     Rate and Rhythm: Normal rate and regular rhythm.     Heart sounds: Normal heart sounds.  Pulmonary:     Effort: Pulmonary effort is normal.     Breath sounds: Normal breath sounds.  Abdominal:     General: There is no distension.  Palpations: Abdomen is soft. There is no mass.  Musculoskeletal:        General: No swelling.  Skin:    General: Skin is warm.  Neurological:     General: No focal deficit present.     Mental Status: She is alert and oriented to person, place, and time.  Psychiatric:        Mood and  Affect: Mood normal.        Behavior: Behavior normal.      LABORATORY DATA:  I have reviewed the labs as listed.  CBC    Component Value Date/Time   WBC 3.3 (L) 04/30/2019 0926   RBC 3.68 (L) 04/30/2019 0926   HGB 10.9 (L) 04/30/2019 0926   HGB 14.2 08/02/2015 1113   HCT 34.6 (L) 04/30/2019 0926   HCT 41.1 08/02/2015 1113   PLT 174 04/30/2019 0926   PLT 371 08/02/2015 1113   MCV 94.0 04/30/2019 0926   MCV 84 08/02/2015 1113   MCH 29.6 04/30/2019 0926   MCHC 31.5 04/30/2019 0926   RDW 15.0 04/30/2019 0926   RDW 15.1 08/02/2015 1113   LYMPHSABS 0.9 04/30/2019 0926   LYMPHSABS 2.9 08/02/2015 1113   MONOABS 0.4 04/30/2019 0926   EOSABS 0.1 04/30/2019 0926   EOSABS 0.2 08/02/2015 1113   BASOSABS 0.0 04/30/2019 0926   BASOSABS 0.1 08/02/2015 1113   CMP Latest Ref Rng & Units 04/30/2019 04/16/2019 04/02/2019  Glucose 70 - 99 mg/dL 129(H) 139(H) 138(H)  BUN 6 - 20 mg/dL 19 19 21(H)  Creatinine 0.44 - 1.00 mg/dL 1.03(H) 1.02(H) 1.12(H)  Sodium 135 - 145 mmol/L 141 140 138  Potassium 3.5 - 5.1 mmol/L 3.5 4.1 3.6  Chloride 98 - 111 mmol/L 104 105 105  CO2 22 - 32 mmol/L 24 25 24   Calcium 8.9 - 10.3 mg/dL 9.5 9.2 8.9  Total Protein 6.5 - 8.1 g/dL 7.2 7.0 7.5  Total Bilirubin 0.3 - 1.2 mg/dL 0.6 0.4 0.6  Alkaline Phos 38 - 126 U/L 63 60 58  AST 15 - 41 U/L 22 21 28   ALT 0 - 44 U/L 18 19 22        DIAGNOSTIC IMAGING:  I have independently reviewed the scans and discussed with the patient.   I have reviewed Venita Lick LPN's note and agree with the documentation.  I personally performed a face-to-face visit, made revisions and my assessment and plan is as follows.    ASSESSMENT & PLAN:   Multiple myeloma ([place]) 1.  IgG lambda multiple myeloma, stage III, del 17p: - Presentation with lower back pain since March, presented to the emergency room on 10/25/2017 with right groin pain, CT scan showed multiple lytic lesions, multiple lytic lesions on skeletal survey - SPEP  shows 1.5 g/dL of monoclonal protein, beta-2 microglobulin of 4.8, free lambda light chains of 2869, kappa by lambda ratio of less than 0, elevated LDH. 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  FISH panel shows -14, -12, 13 q.-/-13 and 17p-.  Bone marrow biopsy shows 90% plasma cells. -4 cycles of RVD from 11/01/2017 through 01/14/2018.  Labs done on 01/20/2018 showed M spike to be negative. - She was evaluated by Dr. Laverta Baltimore at [facility].  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 - Because of her high risk disease, consolidation with 2 cycles of chemotherapy was done with RVD from 06/11/2018 through 07/01/2018.  -Maintenance Velcade every 2 weeks and Revlimid 10 mg 3  weeks on 1 week off started on 07/29/2018.   -Multiple myeloma labs from [place] on 02/09/2019 were negative for SPEP, immunofixation.  Free light chain ratio was normal. - As her creatinine was high, we have dose reduced Revlimid to 5 mg 3 weeks on 1 week of last treatment. -Creatinine today improved to 1.03.  Hence I will increase Revlimid to 10 mg 3 weeks on 1 week off. -She is tolerating Revlimid 5 mg 3 weeks on 1 week off very well.  I will reevaluate her in 1 month.   2.  Bone strengthening agents: -She is continuing Zometa every 3 months.  Last dose was 02/25/2019.  3.  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  She will continue Lyrica 200 mg twice daily.  4.  ID prophylaxis: -We will dose reduce acyclovir to 400 mg twice daily.  5.  Hypomagnesemia: -She will continue magnesium 2 tablets 3 times a day..    6.  Diabetes: -She will continue metformin thousand milligrams twice daily. -She is taking Trulicity every week.     Total time spent is 25 minutes with more than 50% of the time spent face-to-face discussing treatment plan, counseling and coordination of care.  Orders placed this  encounter:  Orders Placed This Encounter  Procedures  . CBC with Differential/Platelet  . Comprehensive metabolic panel  . Kappa/lambda light chains  . Immunofixation electrophoresis  . Protein electrophoresis, serum  . IgG, IgA, IgM  . Lactate dehydrogenase  . Magnesium  . SCHEDULING COMMUNICATION  . TREATMENT CONDITIONS      Derek Jack, [place] [phone]

## 2019-05-18 ENCOUNTER — Other Ambulatory Visit (HOSPITAL_COMMUNITY): Payer: BC Managed Care – PPO

## 2019-05-18 ENCOUNTER — Ambulatory Visit (HOSPITAL_COMMUNITY): Payer: BC Managed Care – PPO

## 2019-05-20 ENCOUNTER — Inpatient Hospital Stay (HOSPITAL_COMMUNITY): Payer: BC Managed Care – PPO

## 2019-05-20 ENCOUNTER — Inpatient Hospital Stay (HOSPITAL_COMMUNITY): Payer: BC Managed Care – PPO | Attending: Hematology

## 2019-05-20 ENCOUNTER — Other Ambulatory Visit: Payer: Self-pay

## 2019-05-20 VITALS — BP 140/79 | HR 72 | Temp 97.7°F | Resp 18 | Wt 257.2 lb

## 2019-05-20 DIAGNOSIS — Z23 Encounter for immunization: Secondary | ICD-10-CM | POA: Diagnosis not present

## 2019-05-20 DIAGNOSIS — C9000 Multiple myeloma not having achieved remission: Secondary | ICD-10-CM

## 2019-05-20 DIAGNOSIS — Z5112 Encounter for antineoplastic immunotherapy: Secondary | ICD-10-CM | POA: Insufficient documentation

## 2019-05-20 DIAGNOSIS — C9001 Multiple myeloma in remission: Secondary | ICD-10-CM

## 2019-05-20 LAB — CBC WITH DIFFERENTIAL/PLATELET
Abs Immature Granulocytes: 0.01 10*3/uL (ref 0.00–0.07)
Basophils Absolute: 0.1 10*3/uL (ref 0.0–0.1)
Basophils Relative: 2 %
Eosinophils Absolute: 0.1 10*3/uL (ref 0.0–0.5)
Eosinophils Relative: 3 %
HCT: 34.9 % — ABNORMAL LOW (ref 36.0–46.0)
Hemoglobin: 11 g/dL — ABNORMAL LOW (ref 12.0–15.0)
Immature Granulocytes: 0 %
Lymphocytes Relative: 34 %
Lymphs Abs: 1 10*3/uL (ref 0.7–4.0)
MCH: 29.6 pg (ref 26.0–34.0)
MCHC: 31.5 g/dL (ref 30.0–36.0)
MCV: 94.1 fL (ref 80.0–100.0)
Monocytes Absolute: 0.4 10*3/uL (ref 0.1–1.0)
Monocytes Relative: 15 %
Neutro Abs: 1.3 10*3/uL — ABNORMAL LOW (ref 1.7–7.7)
Neutrophils Relative %: 46 %
Platelets: 192 10*3/uL (ref 150–400)
RBC: 3.71 MIL/uL — ABNORMAL LOW (ref 3.87–5.11)
RDW: 15.3 % (ref 11.5–15.5)
WBC: 2.8 10*3/uL — ABNORMAL LOW (ref 4.0–10.5)
nRBC: 0 % (ref 0.0–0.2)

## 2019-05-20 LAB — COMPREHENSIVE METABOLIC PANEL
ALT: 15 U/L (ref 0–44)
AST: 21 U/L (ref 15–41)
Albumin: 4.2 g/dL (ref 3.5–5.0)
Alkaline Phosphatase: 61 U/L (ref 38–126)
Anion gap: 12 (ref 5–15)
BUN: 18 mg/dL (ref 6–20)
CO2: 24 mmol/L (ref 22–32)
Calcium: 9.2 mg/dL (ref 8.9–10.3)
Chloride: 103 mmol/L (ref 98–111)
Creatinine, Ser: 0.97 mg/dL (ref 0.44–1.00)
GFR calc Af Amer: >60 mL/min (ref >60)
GFR calc non Af Amer: >60 mL/min (ref >60)
Glucose, Bld: 103 mg/dL — ABNORMAL HIGH (ref 70–99)
Potassium: 3.8 mmol/L (ref 3.5–5.1)
Sodium: 139 mmol/L (ref 135–145)
Total Bilirubin: 1 mg/dL (ref 0.3–1.2)
Total Protein: 7.2 g/dL (ref 6.5–8.1)

## 2019-05-20 LAB — MAGNESIUM: Magnesium: 1.5 mg/dL — ABNORMAL LOW (ref 1.7–2.4)

## 2019-05-20 MED ORDER — PROCHLORPERAZINE MALEATE 10 MG PO TABS: 10.0000 mg | ORAL_TABLET | Freq: Once | ORAL | Status: DC

## 2019-05-20 MED ORDER — BORTEZOMIB CHEMO SQ INJECTION 3.5 MG (2.5MG/ML)
1.3000 mg/m2 | Freq: Once | INTRAMUSCULAR | Status: AC
  Administered 2019-05-20: 3 mg via SUBCUTANEOUS
  Filled 2019-05-20: qty 1.2

## 2019-05-20 NOTE — Progress Notes (Signed)
Joanna Reid presents today for Velcade injection. Lab work from today, including [place] 1.3 reviewed with Reynolds Bowl, NP. Per NP, ok to proceed with Velcade injection today.

## 2019-05-20 NOTE — Patient Instructions (Signed)
[facility] Discharge Instructions for Patients Receiving Chemotherapy   Beginning January 23rd 2017 lab work for the Cancer Center will be done in the  Main lab at [facility] on 1st floor. If you have a lab appointment with the Cancer Center please come in thru the  Main Entrance and check in at the main information desk   Today you received the following chemotherapy agents Velcade  To help prevent nausea and vomiting after your treatment, we encourage you to take your nausea medication    If you develop nausea and vomiting, or diarrhea that is not controlled by your medication, call the clinic.  The clinic phone number is [phone]. Office hours are Monday-Friday 8:30am-5:00pm.  BELOW ARE SYMPTOMS THAT SHOULD BE REPORTED IMMEDIATELY:  *FEVER GREATER THAN 101.0 F  *CHILLS WITH OR WITHOUT FEVER  NAUSEA AND VOMITING THAT IS NOT CONTROLLED WITH YOUR NAUSEA MEDICATION  *UNUSUAL SHORTNESS OF BREATH  *UNUSUAL BRUISING OR BLEEDING  TENDERNESS IN MOUTH AND THROAT WITH OR WITHOUT PRESENCE OF ULCERS  *URINARY PROBLEMS  *BOWEL PROBLEMS  UNUSUAL RASH Items with * indicate a potential emergency and should be followed up as soon as possible. If you have an emergency after office hours please contact your primary care physician or go to the nearest emergency department.  Please call the clinic during office hours if you have any questions or concerns.   You may also contact the Patient Navigator at [phone] should you have any questions or need assistance in obtaining follow up care.      Resources For Cancer Patients and their Caregivers ? American Cancer Society: Can assist with transportation, wigs, general needs, runs Look Good Feel Better.        1-888-227-6333 ? Cancer Care: Provides financial assistance, online support groups, medication/co-pay assistance.  1-800-813-HOPE (4673) ? [facility] Assists [place] cancer  patients and their families through emotional , educational and financial support.  336-427-4357 ? [place] DSS Where to apply for food stamps, Medicaid and utility assistance. 336-342-1394 ? RCATS: Transportation to medical appointments. 336-347-2287 ? Social Security Administration: May apply for disability if have a Stage IV cancer. 336-342-7796 1-800-772-1213 ? [place] Aging, Disability and Transit Services: Assists with nutrition, care and transit needs. 336-349-2343          

## 2019-05-20 NOTE — Progress Notes (Signed)
Treatment given per orders. Patient tolerated it well without problems. Vitals stable and discharged home from clinic ambulatory. Follow up as scheduled.  

## 2019-05-27 ENCOUNTER — Ambulatory Visit (HOSPITAL_COMMUNITY): Payer: BC Managed Care – PPO

## 2019-05-27 ENCOUNTER — Ambulatory Visit (HOSPITAL_COMMUNITY): Payer: BC Managed Care – PPO | Admitting: Hematology

## 2019-05-27 ENCOUNTER — Other Ambulatory Visit (HOSPITAL_COMMUNITY): Payer: BC Managed Care – PPO

## 2019-06-01 ENCOUNTER — Ambulatory Visit (HOSPITAL_COMMUNITY): Payer: BC Managed Care – PPO | Admitting: Hematology

## 2019-06-01 ENCOUNTER — Ambulatory Visit (HOSPITAL_COMMUNITY): Payer: BC Managed Care – PPO

## 2019-06-01 ENCOUNTER — Other Ambulatory Visit (HOSPITAL_COMMUNITY): Payer: BC Managed Care – PPO

## 2019-06-03 ENCOUNTER — Inpatient Hospital Stay (HOSPITAL_COMMUNITY): Payer: BC Managed Care – PPO | Admitting: Hematology

## 2019-06-03 ENCOUNTER — Inpatient Hospital Stay (HOSPITAL_COMMUNITY): Payer: BC Managed Care – PPO

## 2019-06-03 ENCOUNTER — Other Ambulatory Visit: Payer: Self-pay

## 2019-06-03 ENCOUNTER — Encounter (HOSPITAL_COMMUNITY): Payer: Self-pay | Admitting: Hematology

## 2019-06-03 DIAGNOSIS — C9000 Multiple myeloma not having achieved remission: Secondary | ICD-10-CM | POA: Diagnosis not present

## 2019-06-03 DIAGNOSIS — C9001 Multiple myeloma in remission: Secondary | ICD-10-CM

## 2019-06-03 LAB — CBC WITH DIFFERENTIAL/PLATELET
Abs Immature Granulocytes: 0.01 10*3/uL (ref 0.00–0.07)
Basophils Absolute: 0 10*3/uL (ref 0.0–0.1)
Basophils Relative: 1 %
Eosinophils Absolute: 0.1 10*3/uL (ref 0.0–0.5)
Eosinophils Relative: 3 %
HCT: 33.9 % — ABNORMAL LOW (ref 36.0–46.0)
Hemoglobin: 11 g/dL — ABNORMAL LOW (ref 12.0–15.0)
Immature Granulocytes: 0 %
Lymphocytes Relative: 28 %
Lymphs Abs: 0.8 10*3/uL (ref 0.7–4.0)
MCH: 30.1 pg (ref 26.0–34.0)
MCHC: 32.4 g/dL (ref 30.0–36.0)
MCV: 92.6 fL (ref 80.0–100.0)
Monocytes Absolute: 0.3 10*3/uL (ref 0.1–1.0)
Monocytes Relative: 9 %
Neutro Abs: 1.7 10*3/uL (ref 1.7–7.7)
Neutrophils Relative %: 59 %
Platelets: 174 10*3/uL (ref 150–400)
RBC: 3.66 MIL/uL — ABNORMAL LOW (ref 3.87–5.11)
RDW: 15.1 % (ref 11.5–15.5)
WBC: 2.9 10*3/uL — ABNORMAL LOW (ref 4.0–10.5)
nRBC: 0 % (ref 0.0–0.2)

## 2019-06-03 LAB — COMPREHENSIVE METABOLIC PANEL
ALT: 19 U/L (ref 0–44)
AST: 19 U/L (ref 15–41)
Albumin: 4.1 g/dL (ref 3.5–5.0)
Alkaline Phosphatase: 67 U/L (ref 38–126)
Anion gap: 12 (ref 5–15)
BUN: 22 mg/dL — ABNORMAL HIGH (ref 6–20)
CO2: 25 mmol/L (ref 22–32)
Calcium: 9.7 mg/dL (ref 8.9–10.3)
Chloride: 102 mmol/L (ref 98–111)
Creatinine, Ser: 0.98 mg/dL (ref 0.44–1.00)
GFR calc Af Amer: >60 mL/min (ref >60)
GFR calc non Af Amer: >60 mL/min (ref >60)
Glucose, Bld: 107 mg/dL — ABNORMAL HIGH (ref 70–99)
Potassium: 3.8 mmol/L (ref 3.5–5.1)
Sodium: 139 mmol/L (ref 135–145)
Total Bilirubin: 0.8 mg/dL (ref 0.3–1.2)
Total Protein: 7.4 g/dL (ref 6.5–8.1)

## 2019-06-03 LAB — MAGNESIUM: Magnesium: 1.5 mg/dL — ABNORMAL LOW (ref 1.7–2.4)

## 2019-06-03 LAB — LACTATE DEHYDROGENASE: LDH: 113 U/L (ref 98–192)

## 2019-06-03 MED ORDER — PNEUMOCOCCAL 13-VAL CONJ VACC IM SUSP
0.5000 mL | Freq: Once | INTRAMUSCULAR | Status: AC
  Administered 2019-06-03: 0.5 mL via INTRAMUSCULAR

## 2019-06-03 MED ORDER — DTAP-IPV-HIB VACCINE IM SUSR
0.5000 mL | Freq: Once | INTRAMUSCULAR | Status: AC
  Administered 2019-06-03: 0.5 mL via INTRAMUSCULAR
  Filled 2019-06-03: qty 1

## 2019-06-03 MED ORDER — HEPATITIS B VAC RECOMBINANT 20 MCG/ML IJ SUSP
2.0000 mL | Freq: Once | INTRAMUSCULAR | Status: AC
  Administered 2019-06-03: 40 ug via INTRAMUSCULAR
  Filled 2019-06-03: qty 2

## 2019-06-03 MED ORDER — ZOLEDRONIC ACID 4 MG/100ML IV SOLN
4.0000 mg | Freq: Once | INTRAVENOUS | Status: AC
  Administered 2019-06-03: 4 mg via INTRAVENOUS
  Filled 2019-06-03: qty 100

## 2019-06-03 MED ORDER — PROCHLORPERAZINE MALEATE 10 MG PO TABS
10.0000 mg | ORAL_TABLET | Freq: Once | ORAL | Status: AC
  Administered 2019-06-03: 10 mg via ORAL
  Filled 2019-06-03: qty 1

## 2019-06-03 MED ORDER — SODIUM CHLORIDE 0.9 % IV SOLN
INTRAVENOUS | Status: DC
  Administered 2019-06-03: 13:00:00 via INTRAVENOUS

## 2019-06-03 MED ORDER — BORTEZOMIB CHEMO SQ INJECTION 3.5 MG (2.5MG/ML)
1.3000 mg/m2 | Freq: Once | INTRAMUSCULAR | Status: AC
  Administered 2019-06-03: 3 mg via SUBCUTANEOUS
  Filled 2019-06-03: qty 1.2

## 2019-06-03 MED ORDER — HEPATITIS B VAC RECOMBINANT 20 MCG/ML IM INJ
40.0000 ug | INJECTION | Freq: Once | INTRAMUSCULAR | Status: DC
  Filled 2019-06-03: qty 2

## 2019-06-03 NOTE — Progress Notes (Signed)
Labs meet parameters for treatment today . Proceed as planned. Will give vaccinations as ordered.   Treatment given per orders. Patient tolerated it well without problems. Vitals stable and discharged home from clinic ambulatory. Follow up as scheduled.

## 2019-06-03 NOTE — Assessment & Plan Note (Addendum)
1.  IgG lambda multiple myeloma, stage III, del 17p: - Presentation with lower back pain since March, presented to the emergency room on 10/25/2017 with right groin pain, CT scan showed multiple lytic lesions, multiple lytic lesions on skeletal survey - SPEP shows 1.5 g/dL of monoclonal protein, beta-2 microglobulin of 4.8, free lambda light chains of 2869, kappa by lambda ratio of less than 0, elevated LDH. 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  FISH panel shows -14, -12, 13 q.-/-13 and 17p-.  Bone marrow biopsy shows 90% plasma cells. -4 cycles of RVD from 11/01/2017 through 01/14/2018.  Labs done on 01/20/2018 showed M spike to be negative. - She was evaluated by Dr. Laverta Baltimore at [facility].  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 - Because of her high risk disease, consolidation with 2 cycles of chemotherapy was done with RVD from 06/11/2018 through 07/01/2018.  -Maintenance Velcade every 2 weeks and Revlimid 10 mg 3 weeks on 1 week off started on 07/29/2018.   -Myeloma labs from 02/09/2019 was negative for SPEP and immunofixation.  Free light chain ratio was normal.  Labs were done at [facility]. -She has started Revlimid 10 mg 3 weeks on 1 week off and will completed on 06/05/2019.  She has tolerated it very well.  Denied any GI side effects. -Myeloma labs from today are pending.  She will continue Velcade every other week. -She will be seen back in 4 weeks for follow-up.   2.  Bone strengthening agents: -After her last dose of Zometa, her creatinine went up to 1.47.  Hence I will cut back on the dose of Zometa to 3 mg.  3.  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  She will continue Lyrica 200 mg twice daily.  4.  ID prophylaxis: -She will continue acyclovir 400 mg twice daily.  5.  Hypomagnesemia: -She is taking 2 tablets of magnesium to 50 mg daily.  Magnesium is  1.5 today.  If she takes more than that she will have diarrhea.  6.  Diabetes: -She will continue metformin thousand milligrams twice daily. -She is taking Trulicity every week.

## 2019-06-03 NOTE — Progress Notes (Signed)
[place], [place] [address]   CLINIC:  Medical Oncology/Hematology  PCP:  Mikey Kirschner, MD [address] [place] [address] [phone]   REASON FOR VISIT:  Follow-up for multiple myeloma    BRIEF ONCOLOGIC HISTORY:  Oncology History  Multiple myeloma ([place])  10/29/2017 Initial Diagnosis   Multiple myeloma ([place])   11/01/2017 - 01/24/2018 Chemotherapy   The patient had bortezomib SQ (VELCADE) chemo injection 3 mg, 1.3 mg/m2 = 3 mg, Subcutaneous,  Once, 5 of 5 cycles Administration: 3 mg (11/01/2017), 3 mg (11/08/2017), 3 mg (11/05/2017), 3 mg (11/22/2017), 3 mg (11/12/2017), 3 mg (11/29/2017), 3 mg (11/26/2017), 3 mg (12/03/2017), 3 mg (12/13/2017), 3 mg (12/20/2017), 3 mg (12/17/2017), 3 mg (12/24/2017), 3 mg (01/03/2018), 3 mg (01/10/2018), 3 mg (01/07/2018), 3 mg (01/14/2018), 3 mg (01/24/2018)  for chemotherapy treatment.    06/11/2018 -  Chemotherapy   The patient had bortezomib SQ (VELCADE) chemo injection 3 mg, 1.3 mg/m2 = 3 mg, Subcutaneous,  Once, 28 of 32 cycles Administration: 3 mg (06/11/2018), 3 mg (07/29/2018), 3 mg (08/21/2018), 3 mg (09/04/2018), 3 mg (09/18/2018), 3 mg (10/02/2018), 3 mg (10/16/2018), 3 mg (10/29/2018), 3 mg (11/13/2018), 3 mg (06/18/2018), 3 mg (06/25/2018), 3 mg (07/01/2018), 3 mg (07/08/2018), 3 mg (07/15/2018), 3 mg (11/27/2018), 3 mg (12/11/2018), 3 mg (12/25/2018), 3 mg (01/08/2019), 3 mg (01/22/2019), 3 mg (02/05/2019), 3 mg (02/19/2019), 3 mg (03/05/2019), 3 mg (03/19/2019), 3 mg (04/02/2019), 3 mg (04/16/2019), 3 mg (04/30/2019), 3 mg (05/20/2019)  for chemotherapy treatment.       CANCER STAGING: Cancer Staging Multiple myeloma ([place]) Staging form: Plasma Cell Myeloma and Plasma Cell Disorders, AJCC 8th Edition - Clinical: No stage assigned - Unsigned    INTERVAL HISTORY:  Ms. Loy 53 y.o. female seen for follow-up of multiple myeloma.  She is taking Revlimid 10 mg 3 weeks on 1 week off.  She will completed on 06/05/2019.   Denies any nausea, vomiting, diarrhea or constipation.  Denies any worsening of neuropathy.  No fevers or infections noted.  Appetite and energy levels are present.  No new onset pains reported.  She is taking magnesium to 10 mg 2 pills daily.  REVIEW OF SYSTEMS:  Review of Systems  Cardiovascular: Negative for leg swelling.  All other systems reviewed and are negative.    PAST MEDICAL/SURGICAL HISTORY:  Past Medical History:  Diagnosis Date   Acid reflux    Allergic rhinitis    Cancer (HCC)    multiple myeloma   Diabetes mellitus    type 2   Gout    Gout    HBP (high blood pressure)    History of kidney stones    Migraines    Past Surgical History:  Procedure Laterality Date   CESAREAN SECTION     EXTRACORPOREAL SHOCK WAVE LITHOTRIPSY Left 10/10/2017   Procedure: LEFT EXTRACORPOREAL SHOCK WAVE LITHOTRIPSY (ESWL);  Surgeon: Bjorn Loser, MD;  Location: [facility];  Service: Urology;  Laterality: Left;   EYE SURGERY     HEMORRHOID SURGERY N/A 11/19/2012   Procedure: HEMORRHOIDECTOMY;  Surgeon: Jamesetta So, MD;  Location: [facility];  Service: General;  Laterality: N/A;   kidney stones  1998   LAPAROSCOPIC UNILATERAL SALPINGO OOPHERECTOMY  05/14/2012   Procedure: LAPAROSCOPIC UNILATERAL SALPINGO OOPHORECTOMY;  Surgeon: [name], MD;  Location: [facility];  Service: Gynecology;  Laterality: Right;  laparoscopic right salpingo-oophorectomy   PARTIAL HYSTERECTOMY  TONSILECTOMY, ADENOIDECTOMY, BILATERAL MYRINGOTOMY AND TUBES     VESICOVAGINAL FISTULA CLOSURE W/ TAH       SOCIAL HISTORY:  Social History   Socioeconomic History   Marital status: Married    Spouse name: Not on file   Number of children: Not on file   Years of education: 12   Highest education level: Not on file  Occupational History    Employer: [employer]  Social Needs   Financial resource strain: Not on file   Food insecurity    Worry: Never true    Inability: Never true    Transportation needs    Medical: Not on file    Non-medical: Not on file  Tobacco Use   Smoking status: Former Smoker    Quit date: 02/27/1999    Years since quitting: 20.2   Smokeless tobacco: Never Used   Tobacco comment: socially   Substance and Sexual Activity   Alcohol use: No    Alcohol/week: 0.0 standard drinks   Drug use: No   Sexual activity: Yes  Lifestyle   Physical activity    Days per week: Not on file    Minutes per session: Not on file   Stress: Not on file  Relationships   Social connections    Talks on phone: Not on file    Gets together: Not on file    Attends religious service: Not on file    Active member of club or organization: Not on file    Attends meetings of clubs or organizations: Not on file    Relationship status: Not on file   Intimate partner violence    Fear of current or ex partner: Not on file    Emotionally abused: Not on file    Physically abused: Not on file    Forced sexual activity: Not on file  Other Topics Concern   Not on file  Social History Narrative   Not on file    FAMILY HISTORY:  Family History  Problem Relation Age of Onset   Arthritis Other    Cancer Other    Diabetes Other    Hypertension Mother    Dementia Mother    Diabetes Father    ALS Father    Diabetes Brother    Hypertension Brother    Cancer Paternal Aunt    COPD Maternal Grandmother    Cancer Maternal Grandfather    Anesthesia problems Paternal Grandfather     CURRENT MEDICATIONS:  Outpatient Encounter Medications as of 06/03/2019  Medication Sig Note   allopurinol (ZYLOPRIM) 300 MG tablet Take one daily    aspirin EC 81 MG tablet Take 81 mg by mouth daily.    Bortezomib (VELCADE IJ) Inject as directed every 14 (fourteen) days.  08/09/2018: Due for next injection on 08/13/2018   bumetanide (BUMEX) 1 MG tablet Take 1 tablet (1 mg total) by mouth every morning. (Patient taking differently: Take 0.5 mg by mouth every  morning. )    carvedilol (COREG) 25 MG tablet Take 25 mg by mouth 2 (two) times daily.    diazepam (VALIUM) 5 MG tablet TAKE 1 TABLET BY MOUTH AT BEDTIME    Docosanol 10 % CREA Apply 0.5 inches topically 2 (two) times a day.    docusate sodium (COLACE) 100 MG capsule Take 100 mg by mouth daily.     fluticasone (FLONASE) 50 MCG/ACT nasal spray Place 2 sprays into the nose 2 (two) times daily.    lenalidomide (REVLIMID) 10 MG capsule TAKE  1 CAPSULE BY MOUTH  DAILY FOR 21 DAYS ON, THEN  7 DAYS OFF    losartan (COZAAR) 25 MG tablet Take 25 mg by mouth daily.     magnesium oxide (MAG-OX) 400 (241.3 Mg) MG tablet Take 1 tablet (400 mg total) by mouth 3 (three) times daily.    metFORMIN (GLUCOPHAGE) 500 MG tablet TAKE 1 TABLET BY MOUTH ONCE DAILY WITH BREAKFAST (Patient taking differently: Take 1,000 mg by mouth 2 (two) times daily with a meal. )    omeprazole (PRILOSEC) 20 MG capsule Take 1 capsule (20 mg total) by mouth daily.    potassium chloride SA (K-DUR) 20 MEQ tablet TAKE (1) TABLET BY MOUTH TWICE DAILY.    pregabalin (LYRICA) 200 MG capsule TAKE (1) CAPSULE BY MOUTH TWICE DAILY.    rosuvastatin (CRESTOR) 5 MG tablet Take 5 mg by mouth daily.     TRULICITY 5.36 RW/4.3XV SOPN Inject 0.75 mg into the skin every Monday.     valACYclovir (VALTREX) 1000 MG tablet TAKE 2 TABLETS NOW, THEN 2 TABLETS 12 HOURS LATER.    [DISCONTINUED] acyclovir (ZOVIRAX) 400 MG tablet Take 2 tablets (800 mg total) by mouth 2 (two) times daily.    acyclovir (ZOVIRAX) 800 MG tablet Take 800 mg by mouth 2 (two) times daily.    albuterol (PROVENTIL HFA;VENTOLIN HFA) 108 (90 Base) MCG/ACT inhaler Inhale 2 puffs into the lungs every 4 (four) hours as needed for wheezing or shortness of breath. (Patient not taking: Reported on 06/03/2019)    amoxicillin (AMOXIL) 500 MG capsule Take 500 mg by mouth 4 (four) times daily.    fluconazole (DIFLUCAN) 200 MG tablet Take 1 tablet (200 mg total) by mouth daily.     HYDROcodone-acetaminophen (NORCO/VICODIN) 5-325 MG tablet Take 1 tablet by mouth at bedtime as needed for moderate pain. (Patient not taking: Reported on 04/30/2019)    ondansetron (ZOFRAN) 8 MG tablet Take 1 tablet (8 mg total) by mouth every 8 (eight) hours as needed. (Patient not taking: Reported on 04/30/2019)    prochlorperazine (COMPAZINE) 10 MG tablet Take 1 tablet (10 mg total) by mouth every 6 (six) hours as needed for nausea or vomiting. (Patient not taking: Reported on 04/30/2019)    No facility-administered encounter medications on file as of 06/03/2019.     ALLERGIES:  No Known Allergies   PHYSICAL EXAM:  ECOG Performance status: 1  Vitals:   06/03/19 1153  BP: 139/83  Pulse: (!) 59  Resp: 18  Temp: (!) 96.6 F (35.9 C)  SpO2: 98%   Filed Weights   06/03/19 1153  Weight: 255 lb 1.6 oz (115.7 kg)    Physical Exam Vitals signs reviewed.  Constitutional:      Appearance: Normal appearance.  Cardiovascular:     Rate and Rhythm: Normal rate and regular rhythm.     Heart sounds: Normal heart sounds.  Pulmonary:     Effort: Pulmonary effort is normal.     Breath sounds: Normal breath sounds.  Abdominal:     General: There is no distension.     Palpations: Abdomen is soft. There is no mass.  Musculoskeletal:        General: No swelling.  Skin:    General: Skin is warm.  Neurological:     General: No focal deficit present.     Mental Status: She is alert and oriented to person, place, and time.  Psychiatric:        Mood and Affect: Mood normal.  Behavior: Behavior normal.      LABORATORY DATA:  I have reviewed the labs as listed.  CBC    Component Value Date/Time   WBC 2.9 (L) 06/03/2019 1123   RBC 3.66 (L) 06/03/2019 1123   HGB 11.0 (L) 06/03/2019 1123   HGB 14.2 08/02/2015 1113   HCT 33.9 (L) 06/03/2019 1123   HCT 41.1 08/02/2015 1113   PLT 174 06/03/2019 1123   PLT 371 08/02/2015 1113   MCV 92.6 06/03/2019 1123   MCV 84 08/02/2015  1113   MCH 30.1 06/03/2019 1123   MCHC 32.4 06/03/2019 1123   RDW 15.1 06/03/2019 1123   RDW 15.1 08/02/2015 1113   LYMPHSABS 0.8 06/03/2019 1123   LYMPHSABS 2.9 08/02/2015 1113   MONOABS 0.3 06/03/2019 1123   EOSABS 0.1 06/03/2019 1123   EOSABS 0.2 08/02/2015 1113   BASOSABS 0.0 06/03/2019 1123   BASOSABS 0.1 08/02/2015 1113   CMP Latest Ref Rng & Units 06/03/2019 05/20/2019 04/30/2019  Glucose 70 - 99 mg/dL 107(H) 103(H) 129(H)  BUN 6 - 20 mg/dL 22(H) 18 19  Creatinine 0.44 - 1.00 mg/dL 0.98 0.97 1.03(H)  Sodium 135 - 145 mmol/L 139 139 141  Potassium 3.5 - 5.1 mmol/L 3.8 3.8 3.5  Chloride 98 - 111 mmol/L 102 103 104  CO2 22 - 32 mmol/L _0 Calcium 8.9 - 10.3 mg/dL 9.7 9.2 9.5  Total Protein 6.5 - 8.1 g/dL 7.4 7.2 7.2  Total Bilirubin 0.3 - 1.2 mg/dL 0.8 1.0 0.6  Alkaline Phos 38 - 126 U/L 67 61 63  AST 15 - 41 U/L _1 ALT 0 - 44 U/L _2 DIAGNOSTIC IMAGING:  I have independently reviewed the scans and discussed with the patient.   I have reviewed Venita Lick LPN's note and agree with the documentation.  I personally performed a face-to-face visit, made revisions and my assessment and plan is as follows.    ASSESSMENT & PLAN:   Multiple myeloma ([place]) 1.  IgG lambda multiple myeloma, stage III, del 17p: - Presentation with lower back pain since March, presented to the emergency room on 10/25/2017 with right groin pain, CT scan showed multiple lytic lesions, multiple lytic lesions on skeletal survey - SPEP shows 1.5 g/dL of monoclonal protein, beta-2 microglobulin of 4.8, free lambda light chains of 2869, kappa by lambda ratio of less than 0, elevated LDH. 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  FISH panel shows -14, -12, 13 q.-/-13 and 17p-.  Bone marrow biopsy shows 90% plasma cells. -4 cycles of RVD from 11/01/2017 through 01/14/2018.  Labs done on 01/20/2018 showed M spike to be negative. - She was evaluated by Dr. Laverta Baltimore at [facility].  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 - Because of her high risk disease, consolidation with 2 cycles of chemotherapy was done with RVD from 06/11/2018 through 07/01/2018.  -Maintenance Velcade every 2 weeks and Revlimid 10 mg 3 weeks on 1 week off started on 07/29/2018.   -Myeloma labs from 02/09/2019 was negative for SPEP and immunofixation.  Free light chain ratio was normal.  Labs were done at [facility]. -She has started Revlimid 10 mg 3 weeks on 1 week off and will completed on 06/05/2019.  She has tolerated it very well.  Denied any GI side effects. -Myeloma labs from today are pending.  She will continue Velcade every other week. -  She will be seen back in 4 weeks for follow-up.   2.  Bone strengthening agents: -After her last dose of Zometa, her creatinine went up to 1.47.  Hence I will cut back on the dose of Zometa to 3 mg.  3.  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  She will continue Lyrica 200 mg twice daily.  4.  ID prophylaxis: -She will continue acyclovir 400 mg twice daily.  5.  Hypomagnesemia: -She is taking 2 tablets of magnesium to 50 mg daily.  If she takes more than that she will have diarrhea.  6.  Diabetes: -She will continue metformin thousand milligrams twice daily. -She is taking Trulicity every week.     Total time spent is 25 minutes with more than 50% of the time spent face-to-face discussing treatment plan, counseling and coordination of care.  Orders placed this encounter:  No orders of the defined types were placed in this encounter.     Derek Jack, MD [place] [phone]

## 2019-06-03 NOTE — Progress Notes (Signed)
06/03/19  Patient to receive next round of vaccines per [facility]:  Pentacel Hepatitis B 40 mcg Prevnar 13  Orders entered to reflect above.  Henreitta Leber, PharmD

## 2019-06-03 NOTE — Patient Instructions (Addendum)
[facility] at [facility] Discharge Instructions  You were seen today by Dr. Katragadda. He went over your recent lab results. He will see you back in 1 month for labs and follow up.   Thank you for choosing [facility] at [facility] to provide your oncology and hematology care.  To afford each patient quality time with our provider, please arrive at least 15 minutes before your scheduled appointment time.   If you have a lab appointment with the Cancer Center please come in thru the  Main Entrance and check in at the main information desk  You need to re-schedule your appointment should you arrive 10 or more minutes late.  We strive to give you quality time with our providers, and arriving late affects you and other patients whose appointments are after yours.  Also, if you no show three or more times for appointments you may be dismissed from the clinic at the providers discretion.     Again, thank you for choosing [facility].  Our hope is that these requests will decrease the amount of time that you wait before being seen by our physicians.       _____________________________________________________________  Should you have questions after your visit to [facility], please contact our office at [phone] between the hours of 8:00 a.m. and 4:30 p.m.  Voicemails left after 4:00 p.m. will not be returned until the following business day.  For prescription refill requests, have your pharmacy contact our office and allow 72 hours.    Cancer Center Support Programs:   > Cancer Support Group  2nd Tuesday of the month 1pm-2pm, Journey Room    

## 2019-06-03 NOTE — Patient Instructions (Signed)
[facility] Discharge Instructions for Patients Receiving Chemotherapy  Today you received the following chemotherapy agents   To help prevent nausea and vomiting after your treatment, we encourage you to take your nausea medication   If you develop nausea and vomiting that is not controlled by your nausea medication, call the clinic.   BELOW ARE SYMPTOMS THAT SHOULD BE REPORTED IMMEDIATELY:  *FEVER GREATER THAN 100.5 F  *CHILLS WITH OR WITHOUT FEVER  NAUSEA AND VOMITING THAT IS NOT CONTROLLED WITH YOUR NAUSEA MEDICATION  *UNUSUAL SHORTNESS OF BREATH  *UNUSUAL BRUISING OR BLEEDING  TENDERNESS IN MOUTH AND THROAT WITH OR WITHOUT PRESENCE OF ULCERS  *URINARY PROBLEMS  *BOWEL PROBLEMS  UNUSUAL RASH Items with * indicate a potential emergency and should be followed up as soon as possible.  Feel free to call the clinic should you have any questions or concerns. The clinic phone number is [phone].  Please show the CHEMO ALERT CARD at check-in to the Emergency Department and triage nurse.   

## 2019-06-04 LAB — IGG, IGA, IGM
IgA: 164 mg/dL (ref 87–352)
IgG (Immunoglobin G), Serum: 1183 mg/dL (ref 586–1602)
IgM (Immunoglobulin M), Srm: 15 mg/dL — ABNORMAL LOW (ref 26–217)

## 2019-06-05 LAB — PROTEIN ELECTROPHORESIS, SERUM
A/G Ratio: 1.2 (ref 0.7–1.7)
Albumin ELP: 3.7 g/dL (ref 2.9–4.4)
Alpha-1-Globulin: 0.3 g/dL (ref 0.0–0.4)
Alpha-2-Globulin: 0.7 g/dL (ref 0.4–1.0)
Beta Globulin: 1 g/dL (ref 0.7–1.3)
Gamma Globulin: 1.2 g/dL (ref 0.4–1.8)
Globulin, Total: 3.2 g/dL (ref 2.2–3.9)
Total Protein ELP: 6.9 g/dL (ref 6.0–8.5)

## 2019-06-05 LAB — KAPPA/LAMBDA LIGHT CHAINS
Kappa free light chain: 56.8 mg/L — ABNORMAL HIGH (ref 3.3–19.4)
Kappa, lambda light chain ratio: 1.59 (ref 0.26–1.65)
Lambda free light chains: 35.7 mg/L — ABNORMAL HIGH (ref 5.7–26.3)

## 2019-06-08 LAB — IMMUNOFIXATION ELECTROPHORESIS
IgA: 164 mg/dL (ref 87–352)
IgG (Immunoglobin G), Serum: 1132 mg/dL (ref 586–1602)
IgM (Immunoglobulin M), Srm: 15 mg/dL — ABNORMAL LOW (ref 26–217)
Total Protein ELP: 6.8 g/dL (ref 6.0–8.5)

## 2019-06-15 ENCOUNTER — Other Ambulatory Visit (HOSPITAL_COMMUNITY): Payer: BC Managed Care – PPO

## 2019-06-15 ENCOUNTER — Ambulatory Visit (HOSPITAL_COMMUNITY): Payer: BC Managed Care – PPO

## 2019-06-15 ENCOUNTER — Other Ambulatory Visit: Payer: Self-pay | Admitting: Family Medicine

## 2019-06-16 ENCOUNTER — Other Ambulatory Visit: Payer: Self-pay | Admitting: Family Medicine

## 2019-06-17 ENCOUNTER — Other Ambulatory Visit (HOSPITAL_COMMUNITY): Payer: Self-pay | Admitting: *Deleted

## 2019-06-17 ENCOUNTER — Inpatient Hospital Stay (HOSPITAL_COMMUNITY): Payer: BC Managed Care – PPO | Attending: Hematology

## 2019-06-17 ENCOUNTER — Encounter (HOSPITAL_COMMUNITY): Payer: Self-pay

## 2019-06-17 ENCOUNTER — Inpatient Hospital Stay (HOSPITAL_COMMUNITY): Payer: BC Managed Care – PPO

## 2019-06-17 ENCOUNTER — Other Ambulatory Visit: Payer: Self-pay

## 2019-06-17 VITALS — BP 129/76 | HR 66 | Temp 96.0°F | Resp 18 | Wt 250.5 lb

## 2019-06-17 DIAGNOSIS — C9000 Multiple myeloma not having achieved remission: Secondary | ICD-10-CM

## 2019-06-17 DIAGNOSIS — Z5112 Encounter for antineoplastic immunotherapy: Secondary | ICD-10-CM | POA: Insufficient documentation

## 2019-06-17 DIAGNOSIS — C9001 Multiple myeloma in remission: Secondary | ICD-10-CM

## 2019-06-17 LAB — MAGNESIUM: Magnesium: 1.8 mg/dL (ref 1.7–2.4)

## 2019-06-17 LAB — COMPREHENSIVE METABOLIC PANEL
ALT: 16 U/L (ref 0–44)
AST: 19 U/L (ref 15–41)
Albumin: 4.1 g/dL (ref 3.5–5.0)
Alkaline Phosphatase: 75 U/L (ref 38–126)
Anion gap: 13 (ref 5–15)
BUN: 23 mg/dL — ABNORMAL HIGH (ref 6–20)
CO2: 24 mmol/L (ref 22–32)
Calcium: 9.3 mg/dL (ref 8.9–10.3)
Chloride: 103 mmol/L (ref 98–111)
Creatinine, Ser: 1.15 mg/dL — ABNORMAL HIGH (ref 0.44–1.00)
GFR calc Af Amer: >60 mL/min (ref >60)
GFR calc non Af Amer: 54 mL/min — ABNORMAL LOW (ref >60)
Glucose, Bld: 125 mg/dL — ABNORMAL HIGH (ref 70–99)
Potassium: 3.5 mmol/L (ref 3.5–5.1)
Sodium: 140 mmol/L (ref 135–145)
Total Bilirubin: 0.5 mg/dL (ref 0.3–1.2)
Total Protein: 7.7 g/dL (ref 6.5–8.1)

## 2019-06-17 LAB — CBC WITH DIFFERENTIAL/PLATELET
Abs Immature Granulocytes: 0.01 10*3/uL (ref 0.00–0.07)
Basophils Absolute: 0.1 10*3/uL (ref 0.0–0.1)
Basophils Relative: 3 %
Eosinophils Absolute: 0.1 10*3/uL (ref 0.0–0.5)
Eosinophils Relative: 2 %
HCT: 35.3 % — ABNORMAL LOW (ref 36.0–46.0)
Hemoglobin: 11.2 g/dL — ABNORMAL LOW (ref 12.0–15.0)
Immature Granulocytes: 0 %
Lymphocytes Relative: 32 %
Lymphs Abs: 1 10*3/uL (ref 0.7–4.0)
MCH: 29.9 pg (ref 26.0–34.0)
MCHC: 31.7 g/dL (ref 30.0–36.0)
MCV: 94.4 fL (ref 80.0–100.0)
Monocytes Absolute: 0.4 10*3/uL (ref 0.1–1.0)
Monocytes Relative: 14 %
Neutro Abs: 1.5 10*3/uL — ABNORMAL LOW (ref 1.7–7.7)
Neutrophils Relative %: 49 %
Platelets: 214 10*3/uL (ref 150–400)
RBC: 3.74 MIL/uL — ABNORMAL LOW (ref 3.87–5.11)
RDW: 15.9 % — ABNORMAL HIGH (ref 11.5–15.5)
WBC: 3.1 10*3/uL — ABNORMAL LOW (ref 4.0–10.5)
nRBC: 0 % (ref 0.0–0.2)

## 2019-06-17 MED ORDER — LENALIDOMIDE 10 MG PO CAPS: ORAL_CAPSULE | ORAL | 1 refills | Status: DC

## 2019-06-17 MED ORDER — BORTEZOMIB CHEMO SQ INJECTION 3.5 MG (2.5MG/ML)
1.3000 mg/m2 | Freq: Once | INTRAMUSCULAR | Status: AC
  Administered 2019-06-17: 3 mg via SUBCUTANEOUS
  Filled 2019-06-17: qty 1.2

## 2019-06-17 MED ORDER — PROCHLORPERAZINE MALEATE 10 MG PO TABS: 10.0000 mg | ORAL_TABLET | Freq: Once | ORAL | Status: DC

## 2019-06-17 NOTE — Patient Instructions (Signed)
[facility] Discharge Instructions for Patients Receiving Chemotherapy   Beginning January 23rd 2017 lab work for the Cancer Center will be done in the  Main lab at [facility] on 1st floor. If you have a lab appointment with the Cancer Center please come in thru the  Main Entrance and check in at the main information desk   Today you received the following chemotherapy agents Velcade. Follow-up as scheduled. Call clinic for any questions or concerns  To help prevent nausea and vomiting after your treatment, we encourage you to take your nausea medication   If you develop nausea and vomiting, or diarrhea that is not controlled by your medication, call the clinic.  The clinic phone number is [phone]. Office hours are Monday-Friday 8:30am-5:00pm.  BELOW ARE SYMPTOMS THAT SHOULD BE REPORTED IMMEDIATELY:  *FEVER GREATER THAN 101.0 F  *CHILLS WITH OR WITHOUT FEVER  NAUSEA AND VOMITING THAT IS NOT CONTROLLED WITH YOUR NAUSEA MEDICATION  *UNUSUAL SHORTNESS OF BREATH  *UNUSUAL BRUISING OR BLEEDING  TENDERNESS IN MOUTH AND THROAT WITH OR WITHOUT PRESENCE OF ULCERS  *URINARY PROBLEMS  *BOWEL PROBLEMS  UNUSUAL RASH Items with * indicate a potential emergency and should be followed up as soon as possible. If you have an emergency after office hours please contact your primary care physician or go to the nearest emergency department.  Please call the clinic during office hours if you have any questions or concerns.   You may also contact the Patient Navigator at [phone] should you have any questions or need assistance in obtaining follow up care.      Resources For Cancer Patients and their Caregivers ? American Cancer Society: Can assist with transportation, wigs, general needs, runs Look Good Feel Better.        1-888-227-6333 ? Cancer Care: Provides financial assistance, online support groups, medication/co-pay assistance.  1-800-813-HOPE  (4673) ? [facility] Assists [place] cancer patients and their families through emotional , educational and financial support.  336-427-4357 ? [place] DSS Where to apply for food stamps, Medicaid and utility assistance. 336-342-1394 ? [facility]: Transportation to medical appointments. 336-347-2287 ? Social Security Administration: May apply for disability if have a Stage IV cancer. 336-342-7796 1-800-772-1213 ? [place] Aging, Disability and Transit Services: Assists with nutrition, care and transit needs. 336-349-2343         

## 2019-06-17 NOTE — Progress Notes (Signed)
Joanna Reid reviewed with Dr. Delton Coombes and pt approved for Velcade injection today per MD                                                       Joanna Reid tolerated Velcade injection well without complaints or incident. VSS Pt discharged self ambulatory in satisfactory condition

## 2019-06-29 ENCOUNTER — Ambulatory Visit (HOSPITAL_COMMUNITY): Payer: BC Managed Care – PPO

## 2019-06-29 ENCOUNTER — Other Ambulatory Visit (HOSPITAL_COMMUNITY): Payer: BC Managed Care – PPO

## 2019-06-30 ENCOUNTER — Other Ambulatory Visit: Payer: Self-pay

## 2019-07-01 ENCOUNTER — Other Ambulatory Visit (HOSPITAL_COMMUNITY): Payer: BC Managed Care – PPO

## 2019-07-01 ENCOUNTER — Inpatient Hospital Stay (HOSPITAL_COMMUNITY): Payer: BC Managed Care – PPO

## 2019-07-01 ENCOUNTER — Inpatient Hospital Stay (HOSPITAL_COMMUNITY): Payer: BC Managed Care – PPO | Admitting: Hematology

## 2019-07-01 ENCOUNTER — Encounter (HOSPITAL_COMMUNITY): Payer: Self-pay | Admitting: Hematology

## 2019-07-01 VITALS — BP 134/84 | HR 60 | Temp 97.2°F | Resp 14 | Wt 254.8 lb

## 2019-07-01 DIAGNOSIS — C9000 Multiple myeloma not having achieved remission: Secondary | ICD-10-CM

## 2019-07-01 DIAGNOSIS — Z5112 Encounter for antineoplastic immunotherapy: Secondary | ICD-10-CM | POA: Diagnosis not present

## 2019-07-01 DIAGNOSIS — C9001 Multiple myeloma in remission: Secondary | ICD-10-CM

## 2019-07-01 LAB — COMPREHENSIVE METABOLIC PANEL
ALT: 19 U/L (ref 0–44)
AST: 22 U/L (ref 15–41)
Albumin: 3.7 g/dL (ref 3.5–5.0)
Alkaline Phosphatase: 71 U/L (ref 38–126)
Anion gap: 11 (ref 5–15)
BUN: 20 mg/dL (ref 6–20)
CO2: 25 mmol/L (ref 22–32)
Calcium: 9.2 mg/dL (ref 8.9–10.3)
Chloride: 103 mmol/L (ref 98–111)
Creatinine, Ser: 1.11 mg/dL — ABNORMAL HIGH (ref 0.44–1.00)
GFR calc Af Amer: >60 mL/min (ref >60)
GFR calc non Af Amer: 56 mL/min — ABNORMAL LOW (ref >60)
Glucose, Bld: 132 mg/dL — ABNORMAL HIGH (ref 70–99)
Potassium: 3.5 mmol/L (ref 3.5–5.1)
Sodium: 139 mmol/L (ref 135–145)
Total Bilirubin: 0.8 mg/dL (ref 0.3–1.2)
Total Protein: 7 g/dL (ref 6.5–8.1)

## 2019-07-01 LAB — CBC WITH DIFFERENTIAL/PLATELET
Abs Immature Granulocytes: 0 10*3/uL (ref 0.00–0.07)
Basophils Absolute: 0 10*3/uL (ref 0.0–0.1)
Basophils Relative: 1 %
Eosinophils Absolute: 0.1 10*3/uL (ref 0.0–0.5)
Eosinophils Relative: 4 %
HCT: 32.9 % — ABNORMAL LOW (ref 36.0–46.0)
Hemoglobin: 10.6 g/dL — ABNORMAL LOW (ref 12.0–15.0)
Immature Granulocytes: 0 %
Lymphocytes Relative: 33 %
Lymphs Abs: 0.8 10*3/uL (ref 0.7–4.0)
MCH: 30.1 pg (ref 26.0–34.0)
MCHC: 32.2 g/dL (ref 30.0–36.0)
MCV: 93.5 fL (ref 80.0–100.0)
Monocytes Absolute: 0.4 10*3/uL (ref 0.1–1.0)
Monocytes Relative: 15 %
Neutro Abs: 1.2 10*3/uL — ABNORMAL LOW (ref 1.7–7.7)
Neutrophils Relative %: 47 %
Platelets: 136 10*3/uL — ABNORMAL LOW (ref 150–400)
RBC: 3.52 MIL/uL — ABNORMAL LOW (ref 3.87–5.11)
RDW: 15.9 % — ABNORMAL HIGH (ref 11.5–15.5)
WBC: 2.5 10*3/uL — ABNORMAL LOW (ref 4.0–10.5)
nRBC: 0 % (ref 0.0–0.2)

## 2019-07-01 LAB — MAGNESIUM: Magnesium: 1.4 mg/dL — ABNORMAL LOW (ref 1.7–2.4)

## 2019-07-01 MED ORDER — BORTEZOMIB CHEMO SQ INJECTION 3.5 MG (2.5MG/ML)
1.3000 mg/m2 | Freq: Once | INTRAMUSCULAR | Status: AC
  Administered 2019-07-01: 3 mg via SUBCUTANEOUS
  Filled 2019-07-01: qty 1.2

## 2019-07-01 MED ORDER — PROCHLORPERAZINE MALEATE 10 MG PO TABS: 10.0000 mg | ORAL_TABLET | Freq: Once | ORAL | Status: DC

## 2019-07-01 NOTE — Patient Instructions (Signed)
[facility] Discharge Instructions for Patients Receiving Chemotherapy   Beginning January 23rd 2017 lab work for the Cancer Center will be done in the  Main lab at [facility] on 1st floor. If you have a lab appointment with the Cancer Center please come in thru the  Main Entrance and check in at the main information desk   Today you received the following chemotherapy agents Velcade injection. Follow-up as scheduled. Call clinic for any questions or concerns  To help prevent nausea and vomiting after your treatment, we encourage you to take your nausea medication   If you develop nausea and vomiting, or diarrhea that is not controlled by your medication, call the clinic.  The clinic phone number is [phone]. Office hours are Monday-Friday 8:30am-5:00pm.  BELOW ARE SYMPTOMS THAT SHOULD BE REPORTED IMMEDIATELY:  *FEVER GREATER THAN 101.0 F  *CHILLS WITH OR WITHOUT FEVER  NAUSEA AND VOMITING THAT IS NOT CONTROLLED WITH YOUR NAUSEA MEDICATION  *UNUSUAL SHORTNESS OF BREATH  *UNUSUAL BRUISING OR BLEEDING  TENDERNESS IN MOUTH AND THROAT WITH OR WITHOUT PRESENCE OF ULCERS  *URINARY PROBLEMS  *BOWEL PROBLEMS  UNUSUAL RASH Items with * indicate a potential emergency and should be followed up as soon as possible. If you have an emergency after office hours please contact your primary care physician or go to the nearest emergency department.  Please call the clinic during office hours if you have any questions or concerns.   You may also contact the Patient Navigator at [phone] should you have any questions or need assistance in obtaining follow up care.      Resources For Cancer Patients and their Caregivers ? American Cancer Society: Can assist with transportation, wigs, general needs, runs Look Good Feel Better.        1-888-227-6333 ? Cancer Care: Provides financial assistance, online support groups, medication/co-pay assistance.   1-800-813-HOPE (4673) ? [facility] Assists [place] cancer patients and their families through emotional , educational and financial support.  336-427-4357 ? [place] DSS Where to apply for food stamps, Medicaid and utility assistance. 336-342-1394 ? [facility]: Transportation to medical appointments. 336-347-2287 ? Social Security Administration: May apply for disability if have a Stage IV cancer. 336-342-7796 1-800-772-1213 ? [place] Aging, Disability and Transit Services: Assists with nutrition, care and transit needs. 336-349-2343         

## 2019-07-01 NOTE — Progress Notes (Signed)
1101 Labs reviewed with and pt seen by Dr Delton Coombes and pt approved for Velcade injection per MD                                            Joanna Reid tolerated Velcade injection well without complaints or incident. VSS Pt discharged self ambulatory in satisfactory condition accompanied by her husband

## 2019-07-01 NOTE — Patient Instructions (Addendum)
[place] at [facility] Discharge Instructions  You were seen today by Dr. Delton Coombes. He went over your recent lab results. Continue Zometa and Velcade. Please take 2 magnesium tablets this morning and this afternoon. He will see you back in 4 weeks for labs and follow up.   Thank you for choosing [place] at [facility] to provide your oncology and hematology care.  To afford each patient quality time with our provider, please arrive at least 15 minutes before your scheduled appointment time.   If you have a lab appointment with the [place] please come in thru the  Main Entrance and check in at the main information desk  You need to re-schedule your appointment should you arrive 10 or more minutes late.  We strive to give you quality time with our providers, and arriving late affects you and other patients whose appointments are after yours.  Also, if you no show three or more times for appointments you may be dismissed from the clinic at the providers discretion.     Again, thank you for choosing [facility].  Our hope is that these requests will decrease the amount of time that you wait before being seen by our physicians.       _____________________________________________________________  Should you have questions after your visit to [facility], please contact our office at [phone] between the hours of 8:00 a.m. and 4:30 p.m.  Voicemails left after 4:00 p.m. will not be returned until the following business day.  For prescription refill requests, have your pharmacy contact our office and allow 72 hours.    Cancer Center Support Programs:   > Cancer Support Group  2nd Tuesday of the month 1pm-2pm, Journey Room

## 2019-07-01 NOTE — Progress Notes (Signed)
Joanna Reid, Joanna Reid [id number]   CLINIC:  Medical Oncology/Hematology  PCP:  Joanna Kirschner, MD [name] [place] [address] [phone]   REASON FOR VISIT:  Follow-up for multiple myeloma    BRIEF ONCOLOGIC HISTORY:  Oncology History  Multiple myeloma ([place])  10/29/2017 Initial Diagnosis   Multiple myeloma ([place])   11/01/2017 - 01/24/2018 Chemotherapy   The patient had bortezomib SQ (VELCADE) chemo injection 3 mg, 1.3 mg/m2 = 3 mg, Subcutaneous,  Once, 5 of 5 cycles Administration: 3 mg (11/01/2017), 3 mg (11/08/2017), 3 mg (11/05/2017), 3 mg (11/22/2017), 3 mg (11/12/2017), 3 mg (11/29/2017), 3 mg (11/26/2017), 3 mg (12/03/2017), 3 mg (12/13/2017), 3 mg (12/20/2017), 3 mg (12/17/2017), 3 mg (12/24/2017), 3 mg (01/03/2018), 3 mg (01/10/2018), 3 mg (01/07/2018), 3 mg (01/14/2018), 3 mg (01/24/2018)  for chemotherapy treatment.    06/11/2018 -  Chemotherapy   The patient had bortezomib SQ (VELCADE) chemo injection 3 mg, 1.3 mg/m2 = 3 mg, Subcutaneous,  Once, 30 of 37 cycles Administration: 3 mg (06/11/2018), 3 mg (07/29/2018), 3 mg (08/21/2018), 3 mg (09/04/2018), 3 mg (09/18/2018), 3 mg (10/02/2018), 3 mg (10/16/2018), 3 mg (10/29/2018), 3 mg (11/13/2018), 3 mg (06/18/2018), 3 mg (06/25/2018), 3 mg (07/01/2018), 3 mg (07/08/2018), 3 mg (07/15/2018), 3 mg (11/27/2018), 3 mg (12/11/2018), 3 mg (12/25/2018), 3 mg (01/08/2019), 3 mg (01/22/2019), 3 mg (02/05/2019), 3 mg (02/19/2019), 3 mg (03/05/2019), 3 mg (03/19/2019), 3 mg (04/02/2019), 3 mg (04/16/2019), 3 mg (04/30/2019), 3 mg (05/20/2019), 3 mg (06/03/2019), 3 mg (06/17/2019)  for chemotherapy treatment.       CANCER STAGING: Cancer Staging Multiple myeloma ([place]) Staging form: Plasma Cell Myeloma and Plasma Cell Disorders, AJCC 8th Edition - Clinical: No stage assigned - Unsigned - Clinical: No stage assigned - Unsigned    INTERVAL HISTORY:  [name] 53 y.o. female seen for follow-up of multiple myeloma.  She is  continuing to tolerate Revlimid 10 mg 3 weeks on 1 week off very well.  She will complete her Revlimid on 07/03/2019.  Denies any nausea, vomiting, diarrhea or constipation.  She apparently changed her magnesium preparation couple of days ago.  Energy levels are 50%.  Appetite is 100%.  No pain reported.  Numbness in the feet has been stable and well controlled with Lyrica.  REVIEW OF SYSTEMS:  Review of Systems  Neurological: Positive for numbness.  All other systems reviewed and are negative.    PAST MEDICAL/SURGICAL HISTORY:  Past Medical History:  Diagnosis Date  . Acid reflux   . Allergic rhinitis   . Cancer ([place])    multiple myeloma  . Diabetes mellitus    type 2  . Gout   . Gout   . HBP (high blood pressure)   . History of kidney stones   . Migraines    Past Surgical History:  Procedure Laterality Date  . CESAREAN SECTION    . EXTRACORPOREAL SHOCK WAVE LITHOTRIPSY Left 10/10/2017   Procedure: LEFT EXTRACORPOREAL SHOCK WAVE LITHOTRIPSY (ESWL);  Surgeon: Bjorn Loser, MD;  Location: [facility];  Service: Urology;  Laterality: Left;  . EYE SURGERY    . HEMORRHOID SURGERY N/A 11/19/2012   Procedure: HEMORRHOIDECTOMY;  Surgeon: Jamesetta So, MD;  Location: [facility]S;  Service: General;  Laterality: N/A;  . kidney stones  1998  . LAPAROSCOPIC UNILATERAL SALPINGO OOPHERECTOMY  05/14/2012   Procedure: LAPAROSCOPIC UNILATERAL SALPINGO OOPHORECTOMY;  Surgeon: [name], MD;  Location: [facility];  Service: Gynecology;  Laterality: Right;  laparoscopic right salpingo-oophorectomy  . PARTIAL HYSTERECTOMY    . TONSILECTOMY, ADENOIDECTOMY, BILATERAL MYRINGOTOMY AND TUBES    . VESICOVAGINAL FISTULA CLOSURE W/ TAH       SOCIAL HISTORY:  Social History   Socioeconomic History  . Marital status: Married    Spouse name: Not on file  . Number of children: Not on file  . Years of education: 51  . Highest education level: Not on file  Occupational History    Employer: [employer]  Tobacco  Use  . Smoking status: Former Smoker    Quit date: 02/27/1999    Years since quitting: 20.3  . Smokeless tobacco: Never Used  . Tobacco comment: socially   Substance and Sexual Activity  . Alcohol use: No    Alcohol/week: 0.0 standard drinks  . Drug use: No  . Sexual activity: Yes  Other Topics Concern  . Not on file  Social History Narrative  . Not on file   Social Determinants of Health   Financial Resource Strain:   . Difficulty of Paying Living Expenses: Not on file  Food Insecurity:   . Worried About Charity fundraiser in the Last Year: Not on file  . Ran Out of Food in the Last Year: Not on file  Transportation Needs:   . Lack of Transportation (Medical): Not on file  . Lack of Transportation (Non-Medical): Not on file  Physical Activity:   . Days of Exercise per Week: Not on file  . Minutes of Exercise per Session: Not on file  Stress:   . Feeling of Stress : Not on file  Social Connections:   . Frequency of Communication with Friends and Family: Not on file  . Frequency of Social Gatherings with Friends and Family: Not on file  . Attends Religious Services: Not on file  . Active Member of Clubs or Organizations: Not on file  . Attends Archivist Meetings: Not on file  . Marital Status: Not on file  Intimate Partner Violence:   . Fear of Current or Ex-Partner: Not on file  . Emotionally Abused: Not on file  . Physically Abused: Not on file  . Sexually Abused: Not on file    FAMILY HISTORY:  Family History  Problem Relation Age of Onset  . Arthritis Other   . Cancer Other   . Diabetes Other   . Hypertension Mother   . Dementia Mother   . Diabetes Father   . ALS Father   . Diabetes Brother   . Hypertension Brother   . Cancer Paternal Aunt   . COPD Maternal Grandmother   . Cancer Maternal Grandfather   . Anesthesia problems Paternal Grandfather     CURRENT MEDICATIONS:  Outpatient Encounter Medications as of 07/01/2019  Medication Sig  Note  . acyclovir (ZOVIRAX) 800 MG tablet Take 800 mg by mouth 2 (two) times daily.   Marland Kitchen albuterol (PROVENTIL HFA;VENTOLIN HFA) 108 (90 Base) MCG/ACT inhaler Inhale 2 puffs into the lungs every 4 (four) hours as needed for wheezing or shortness of breath. (Patient not taking: Reported on 06/03/2019)   . allopurinol (ZYLOPRIM) 300 MG tablet Take one daily   . amoxicillin (AMOXIL) 500 MG capsule Take 500 mg by mouth 4 (four) times daily.   Marland Kitchen aspirin EC 81 MG tablet Take 81 mg by mouth daily.   . Bortezomib (VELCADE IJ) Inject as directed every 14 (fourteen) days.  08/09/2018: Due for next injection on 08/13/2018  .  bumetanide (BUMEX) 1 MG tablet Take 1 tablet (1 mg total) by mouth every morning. (Patient taking differently: Take 0.5 mg by mouth every morning. )   . carvedilol (COREG) 25 MG tablet Take 25 mg by mouth 2 (two) times daily.   . diazepam (VALIUM) 5 MG tablet TAKE 1 TABLET BY MOUTH AT BEDTIME   . Docosanol 10 % CREA Apply 0.5 inches topically 2 (two) times a day.   . docusate sodium (COLACE) 100 MG capsule Take 100 mg by mouth daily.    . fluconazole (DIFLUCAN) 200 MG tablet Take 1 tablet (200 mg total) by mouth daily.   . fluticasone (FLONASE) 50 MCG/ACT nasal spray Place 2 sprays into the nose 2 (two) times daily.   Marland Kitchen HYDROcodone-acetaminophen (NORCO/VICODIN) 5-325 MG tablet Take 1 tablet by mouth at bedtime as needed for moderate pain. (Patient not taking: Reported on 04/30/2019)   . lenalidomide (REVLIMID) 10 MG capsule TAKE 1 CAPSULE BY MOUTH  DAILY FOR 21 DAYS ON, THEN  7 DAYS OFF   . losartan (COZAAR) 25 MG tablet Take 25 mg by mouth daily.    . magnesium oxide (MAG-OX) 400 (241.3 Mg) MG tablet Take 1 tablet (400 mg total) by mouth 3 (three) times daily.   . metFORMIN (GLUCOPHAGE) 500 MG tablet TAKE 1 TABLET BY MOUTH ONCE DAILY WITH BREAKFAST (Patient taking differently: Take 1,000 mg by mouth 2 (two) times daily with a meal. )   . omeprazole (PRILOSEC) 20 MG capsule Take 1 capsule  (20 mg total) by mouth daily.   . ondansetron (ZOFRAN) 8 MG tablet Take 1 tablet (8 mg total) by mouth every 8 (eight) hours as needed. (Patient not taking: Reported on 04/30/2019)   . potassium chloride SA (KLOR-CON) 20 MEQ tablet TAKE (1) TABLET BY MOUTH TWICE DAILY.   Marland Kitchen pregabalin (LYRICA) 200 MG capsule TAKE (1) CAPSULE BY MOUTH TWICE DAILY.   Marland Kitchen prochlorperazine (COMPAZINE) 10 MG tablet Take 1 tablet (10 mg total) by mouth every 6 (six) hours as needed for nausea or vomiting. (Patient not taking: Reported on 04/30/2019)   . rosuvastatin (CRESTOR) 5 MG tablet Take 5 mg by mouth daily.    . TRULICITY 4.98 YM/4.1RA SOPN Inject 0.75 mg into the skin every Monday.    . valACYclovir (VALTREX) 1000 MG tablet TAKE 2 TABLETS NOW, THEN 2 TABLETS 12 HOURS LATER.    No facility-administered encounter medications on file as of 07/01/2019.    ALLERGIES:  No Known Allergies   PHYSICAL EXAM:  ECOG Performance status: 1  Vitals:   07/01/19 1014  BP: 134/84  Pulse: 60  Resp: 14  Temp: (!) 97.2 F (36.2 C)  SpO2: 98%   Filed Weights   07/01/19 1014  Weight: 254 lb 12.8 oz (115.6 kg)    Physical Exam Vitals reviewed.  Constitutional:      Appearance: Normal appearance.  Cardiovascular:     Rate and Rhythm: Normal rate and regular rhythm.     Heart sounds: Normal heart sounds.  Pulmonary:     Effort: Pulmonary effort is normal.     Breath sounds: Normal breath sounds.  Abdominal:     General: There is no distension.     Palpations: Abdomen is soft. There is no mass.  Musculoskeletal:        General: No swelling.  Skin:    General: Skin is warm.  Neurological:     General: No focal deficit present.     Mental Status: She is alert  and oriented to person, place, and time.  Psychiatric:        Mood and Affect: Mood normal.        Behavior: Behavior normal.      LABORATORY DATA:  I have reviewed the labs as listed.  CBC    Component Value Date/Time   WBC 2.5 (L) 07/01/2019  0949   RBC 3.52 (L) 07/01/2019 0949   HGB 10.6 (L) 07/01/2019 0949   HGB 14.2 08/02/2015 1113   HCT 32.9 (L) 07/01/2019 0949   HCT 41.1 08/02/2015 1113   PLT 136 (L) 07/01/2019 0949   PLT 371 08/02/2015 1113   MCV 93.5 07/01/2019 0949   MCV 84 08/02/2015 1113   MCH 30.1 07/01/2019 0949   MCHC 32.2 07/01/2019 0949   RDW 15.9 (H) 07/01/2019 0949   RDW 15.1 08/02/2015 1113   LYMPHSABS 0.8 07/01/2019 0949   LYMPHSABS 2.9 08/02/2015 1113   MONOABS 0.4 07/01/2019 0949   EOSABS 0.1 07/01/2019 0949   EOSABS 0.2 08/02/2015 1113   BASOSABS 0.0 07/01/2019 0949   BASOSABS 0.1 08/02/2015 1113   CMP Latest Ref Rng & Units 07/01/2019 06/17/2019 06/03/2019  Glucose 70 - 99 mg/dL 132(H) 125(H) 107(H)  BUN 6 - 20 mg/dL 20 23(H) 22(H)  Creatinine 0.44 - 1.00 mg/dL 1.11(H) 1.15(H) 0.98  Sodium 135 - 145 mmol/L 139 140 139  Potassium 3.5 - 5.1 mmol/L 3.5 3.5 3.8  Chloride 98 - 111 mmol/L 103 103 102  CO2 22 - 32 mmol/L 25 24 25   Calcium 8.9 - 10.3 mg/dL 9.2 9.3 9.7  Total Protein 6.5 - 8.1 g/dL 7.0 7.7 7.4  Total Bilirubin 0.3 - 1.2 mg/dL 0.8 0.5 0.8  Alkaline Phos 38 - 126 U/L 71 75 67  AST 15 - 41 U/L 22 19 19   ALT 0 - 44 U/L 19 16 19        DIAGNOSTIC IMAGING:  I have independently reviewed the scans and discussed with the patient.   I have reviewed Venita Lick LPN's note and agree with the documentation.  I personally performed a face-to-face visit, made revisions and my assessment and plan is as follows.    ASSESSMENT & PLAN:   Multiple myeloma ([place]) 1.  IgG lambda multiple myeloma, stage III, del 17p: - Presentation with lower back pain since March, presented to the emergency room on 10/25/2017 with right groin pain, CT scan showed multiple lytic lesions, multiple lytic lesions on skeletal survey - SPEP shows 1.5 g/dL of monoclonal protein, beta-2 microglobulin of 4.8, free lambda light chains of 2869, kappa by lambda ratio of less than 0, elevated LDH. -Chromosome analysis  showed no metaphase chromosomes.  FISH panel shows -14, -12, 13 q.-/-13 and 17p-.  Bone marrow biopsy shows 90% plasma cells. -4 cycles of RVD from 11/01/2017 through 01/14/2018.  Labs done on 01/20/2018 showed M spike to be negative. - She was evaluated by Dr. Laverta Baltimore at [facility].  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 - Because of her high risk disease, consolidation with 2 cycles of chemotherapy was done with RVD from 06/11/2018 through 07/01/2018.  -Maintenance Velcade every 2 weeks and Revlimid 10 mg 3 weeks on 1 week off started on 07/29/2018.   -Myeloma labs from [place] on 02/09/2019 was negative. -We reviewed myeloma labs from 06/03/2019 which showed SPEP was negative.  Free light chain was 1.59.  Kappa light chains improved to 56.8. -She is tolerating Revlimid very  well.  She will take her last tablet on the 25th of this month. -She is also tolerating Velcade very well.  No worsening of neuropathy in the feet.  Her ANC is 1200 today.  She will proceed with Velcade maintenance. -We will reevaluate her in 4 weeks.  2.  Bone strengthening agents: -Zometa was cut back to 3 mg due to increase in creatinine.  She received full dose on 06/03/2019.  Her creatinine today is 1.1.  3.  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  She is continuing Lyrica 200 mg twice daily which is helping.  4.  ID prophylaxis: -She will continue acyclovir twice daily.  5.  Hypomagnesemia: -She was taking 2 tablets of magnesium 250 mg daily.  She is taking a new preparation of 200 mg magnesium 2 tablets daily at this time. -Magnesium today is low at 1.4.  She was told to take extra 2 pills today.  She was told to go back to her original preparation of magnesium and take 2 in the morning and 1 in the evening.  6.  Diabetes: -She will continue Metformin 1000 mg twice daily.  She will continue Trulicity every  week.     Orders placed this encounter:  Orders Placed This Encounter  Procedures  . CBC with Differential/Platelet  . Comprehensive metabolic panel  . Protein electrophoresis, serum  . Kappa/lambda light chains  . Lactate dehydrogenase  . Magnesium  . Immunofixation electrophoresis      Derek Jack, MD [place] [phone]

## 2019-07-01 NOTE — Assessment & Plan Note (Addendum)
1.  IgG lambda multiple myeloma, stage III, del 17p: - Presentation with lower back pain since March, presented to the emergency room on 10/25/2017 with right groin pain, CT scan showed multiple lytic lesions, multiple lytic lesions on skeletal survey - SPEP shows 1.5 g/dL of monoclonal protein, beta-2 microglobulin of 4.8, free lambda light chains of 2869, kappa by lambda ratio of less than 0, elevated LDH. 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  FISH panel shows -14, -12, 13 q.-/-13 and 17p-.  Bone marrow biopsy shows 90% plasma cells. -4 cycles of RVD from 11/01/2017 through 01/14/2018.  Labs done on 01/20/2018 showed M spike to be negative. - She was evaluated by Dr. Laverta Baltimore at [facility].  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 - Because of her high risk disease, consolidation with 2 cycles of chemotherapy was done with RVD from 06/11/2018 through 07/01/2018.  -Maintenance Velcade every 2 weeks and Revlimid 10 mg 3 weeks on 1 week off started on 07/29/2018.   -Myeloma labs from [place] on 02/09/2019 was negative. -We reviewed myeloma labs from 06/03/2019 which showed SPEP was negative.  Free light chain was 1.59.  Kappa light chains improved to 56.8. -She is tolerating Revlimid very well.  She will take her last tablet on the 25th of this month. -She is also tolerating Velcade very well.  No worsening of neuropathy in the feet.  Her ANC is 1200 today.  She will proceed with Velcade maintenance. -We will reevaluate her in 4 weeks.  2.  Bone strengthening agents: -Zometa was cut back to 3 mg due to increase in creatinine.  She received full dose on 06/03/2019.  Her creatinine today is 1.1.  3.  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  She is continuing Lyrica 200 mg twice daily which is helping.  4.  ID prophylaxis: -She will continue acyclovir twice daily.  5.   Hypomagnesemia: -She was taking 2 tablets of magnesium 250 mg daily.  She is taking a new preparation of 200 mg magnesium 2 tablets daily at this time. -Magnesium today is low at 1.4.  She was told to take extra 2 pills today.  She was told to go back to her original preparation of magnesium and take 2 in the morning and 1 in the evening.  6.  Diabetes: -She will continue Metformin 1000 mg twice daily.  She will continue Trulicity every week.

## 2019-07-06 ENCOUNTER — Other Ambulatory Visit: Payer: Self-pay | Admitting: Family Medicine

## 2019-07-13 ENCOUNTER — Ambulatory Visit (HOSPITAL_COMMUNITY): Payer: BC Managed Care – PPO

## 2019-07-13 ENCOUNTER — Other Ambulatory Visit (HOSPITAL_COMMUNITY): Payer: BC Managed Care – PPO

## 2019-07-14 ENCOUNTER — Other Ambulatory Visit (HOSPITAL_COMMUNITY): Payer: Self-pay | Admitting: Hematology

## 2019-07-15 ENCOUNTER — Other Ambulatory Visit: Payer: Self-pay

## 2019-07-15 ENCOUNTER — Inpatient Hospital Stay (HOSPITAL_COMMUNITY): Payer: BC Managed Care – PPO | Attending: Hematology

## 2019-07-15 ENCOUNTER — Encounter (HOSPITAL_COMMUNITY): Payer: Self-pay

## 2019-07-15 ENCOUNTER — Inpatient Hospital Stay (HOSPITAL_COMMUNITY): Payer: BC Managed Care – PPO

## 2019-07-15 VITALS — BP 112/67 | HR 55 | Temp 97.4°F | Resp 18

## 2019-07-15 DIAGNOSIS — C9000 Multiple myeloma not having achieved remission: Secondary | ICD-10-CM | POA: Insufficient documentation

## 2019-07-15 DIAGNOSIS — Z5112 Encounter for antineoplastic immunotherapy: Secondary | ICD-10-CM | POA: Insufficient documentation

## 2019-07-15 DIAGNOSIS — C9001 Multiple myeloma in remission: Secondary | ICD-10-CM

## 2019-07-15 LAB — COMPREHENSIVE METABOLIC PANEL
ALT: 18 U/L (ref 0–44)
AST: 22 U/L (ref 15–41)
Albumin: 4 g/dL (ref 3.5–5.0)
Alkaline Phosphatase: 73 U/L (ref 38–126)
Anion gap: 10 (ref 5–15)
BUN: 25 mg/dL — ABNORMAL HIGH (ref 6–20)
CO2: 26 mmol/L (ref 22–32)
Calcium: 9.5 mg/dL (ref 8.9–10.3)
Chloride: 101 mmol/L (ref 98–111)
Creatinine, Ser: 1.08 mg/dL — ABNORMAL HIGH (ref 0.44–1.00)
GFR calc Af Amer: >60 mL/min (ref >60)
GFR calc non Af Amer: 58 mL/min — ABNORMAL LOW (ref >60)
Glucose, Bld: 157 mg/dL — ABNORMAL HIGH (ref 70–99)
Potassium: 3.7 mmol/L (ref 3.5–5.1)
Sodium: 137 mmol/L (ref 135–145)
Total Bilirubin: 0.8 mg/dL (ref 0.3–1.2)
Total Protein: 7.5 g/dL (ref 6.5–8.1)

## 2019-07-15 LAB — CBC WITH DIFFERENTIAL/PLATELET
Abs Immature Granulocytes: 0.01 10*3/uL (ref 0.00–0.07)
Basophils Absolute: 0.1 10*3/uL (ref 0.0–0.1)
Basophils Relative: 3 %
Eosinophils Absolute: 0.1 10*3/uL (ref 0.0–0.5)
Eosinophils Relative: 3 %
HCT: 34.6 % — ABNORMAL LOW (ref 36.0–46.0)
Hemoglobin: 11.1 g/dL — ABNORMAL LOW (ref 12.0–15.0)
Immature Granulocytes: 0 %
Lymphocytes Relative: 30 %
Lymphs Abs: 0.9 10*3/uL (ref 0.7–4.0)
MCH: 30.2 pg (ref 26.0–34.0)
MCHC: 32.1 g/dL (ref 30.0–36.0)
MCV: 94.3 fL (ref 80.0–100.0)
Monocytes Absolute: 0.4 10*3/uL (ref 0.1–1.0)
Monocytes Relative: 13 %
Neutro Abs: 1.5 10*3/uL — ABNORMAL LOW (ref 1.7–7.7)
Neutrophils Relative %: 51 %
Platelets: 196 10*3/uL (ref 150–400)
RBC: 3.67 MIL/uL — ABNORMAL LOW (ref 3.87–5.11)
RDW: 16.2 % — ABNORMAL HIGH (ref 11.5–15.5)
WBC: 2.9 10*3/uL — ABNORMAL LOW (ref 4.0–10.5)
nRBC: 0 % (ref 0.0–0.2)

## 2019-07-15 LAB — MAGNESIUM: Magnesium: 1.6 mg/dL — ABNORMAL LOW (ref 1.7–2.4)

## 2019-07-15 MED ORDER — PROCHLORPERAZINE MALEATE 10 MG PO TABS: 10.0000 mg | ORAL_TABLET | Freq: Once | ORAL | Status: DC

## 2019-07-15 MED ORDER — BORTEZOMIB CHEMO SQ INJECTION 3.5 MG (2.5MG/ML)
1.3000 mg/m2 | Freq: Once | INTRAMUSCULAR | Status: AC
  Administered 2019-07-15: 3 mg via SUBCUTANEOUS
  Filled 2019-07-15: qty 1.2

## 2019-07-15 NOTE — Progress Notes (Signed)
Labs reviewed with ANC 1.5 today.  Reviewed with Harriet Pho, NP, and ok to treat verbal order.  Magnesium 1.6 today and takes Magnesium at home.  No s/s of distress noted.    Patient tolerated injection with no complaints voiced.  Site clean and dry with no bruising or swelling noted at site.  Band aid applied.  Vss with discharge and left ambulatory with no s/s of distress noted.

## 2019-07-17 ENCOUNTER — Other Ambulatory Visit (HOSPITAL_COMMUNITY): Payer: Self-pay | Admitting: *Deleted

## 2019-07-17 MED ORDER — LENALIDOMIDE 10 MG PO CAPS: ORAL_CAPSULE | ORAL | 1 refills | Status: DC

## 2019-07-29 ENCOUNTER — Other Ambulatory Visit: Payer: Self-pay

## 2019-07-29 ENCOUNTER — Inpatient Hospital Stay (HOSPITAL_COMMUNITY): Payer: BC Managed Care – PPO | Admitting: Hematology

## 2019-07-29 ENCOUNTER — Inpatient Hospital Stay (HOSPITAL_COMMUNITY): Payer: BC Managed Care – PPO

## 2019-07-29 VITALS — BP 133/79 | HR 57 | Temp 97.7°F | Resp 20 | Wt 250.6 lb

## 2019-07-29 DIAGNOSIS — C9000 Multiple myeloma not having achieved remission: Secondary | ICD-10-CM

## 2019-07-29 DIAGNOSIS — Z5112 Encounter for antineoplastic immunotherapy: Secondary | ICD-10-CM | POA: Diagnosis not present

## 2019-07-29 LAB — COMPREHENSIVE METABOLIC PANEL
ALT: 19 U/L (ref 0–44)
AST: 19 U/L (ref 15–41)
Albumin: 3.9 g/dL (ref 3.5–5.0)
Alkaline Phosphatase: 67 U/L (ref 38–126)
Anion gap: 13 (ref 5–15)
BUN: 20 mg/dL (ref 6–20)
CO2: 23 mmol/L (ref 22–32)
Calcium: 9.1 mg/dL (ref 8.9–10.3)
Chloride: 103 mmol/L (ref 98–111)
Creatinine, Ser: 1.02 mg/dL — ABNORMAL HIGH (ref 0.44–1.00)
GFR calc Af Amer: >60 mL/min (ref >60)
GFR calc non Af Amer: >60 mL/min (ref >60)
Glucose, Bld: 128 mg/dL — ABNORMAL HIGH (ref 70–99)
Potassium: 3.6 mmol/L (ref 3.5–5.1)
Sodium: 139 mmol/L (ref 135–145)
Total Bilirubin: 0.7 mg/dL (ref 0.3–1.2)
Total Protein: 7.2 g/dL (ref 6.5–8.1)

## 2019-07-29 LAB — MAGNESIUM: Magnesium: 1.6 mg/dL — ABNORMAL LOW (ref 1.7–2.4)

## 2019-07-29 LAB — CBC WITH DIFFERENTIAL/PLATELET
Abs Immature Granulocytes: 0.01 10*3/uL (ref 0.00–0.07)
Basophils Absolute: 0 10*3/uL (ref 0.0–0.1)
Basophils Relative: 1 %
Eosinophils Absolute: 0.1 10*3/uL (ref 0.0–0.5)
Eosinophils Relative: 4 %
HCT: 34.4 % — ABNORMAL LOW (ref 36.0–46.0)
Hemoglobin: 11.2 g/dL — ABNORMAL LOW (ref 12.0–15.0)
Immature Granulocytes: 0 %
Lymphocytes Relative: 36 %
Lymphs Abs: 0.9 10*3/uL (ref 0.7–4.0)
MCH: 30.6 pg (ref 26.0–34.0)
MCHC: 32.6 g/dL (ref 30.0–36.0)
MCV: 94 fL (ref 80.0–100.0)
Monocytes Absolute: 0.3 10*3/uL (ref 0.1–1.0)
Monocytes Relative: 13 %
Neutro Abs: 1.2 10*3/uL — ABNORMAL LOW (ref 1.7–7.7)
Neutrophils Relative %: 46 %
Platelets: 139 10*3/uL — ABNORMAL LOW (ref 150–400)
RBC: 3.66 MIL/uL — ABNORMAL LOW (ref 3.87–5.11)
RDW: 15.8 % — ABNORMAL HIGH (ref 11.5–15.5)
WBC: 2.5 10*3/uL — ABNORMAL LOW (ref 4.0–10.5)
nRBC: 0 % (ref 0.0–0.2)

## 2019-07-29 LAB — LACTATE DEHYDROGENASE: LDH: 114 U/L (ref 98–192)

## 2019-07-29 MED ORDER — BORTEZOMIB CHEMO SQ INJECTION 3.5 MG (2.5MG/ML)
1.3000 mg/m2 | Freq: Once | INTRAMUSCULAR | Status: AC
  Administered 2019-07-29: 3 mg via SUBCUTANEOUS
  Filled 2019-07-29: qty 1.2

## 2019-07-29 MED ORDER — PROCHLORPERAZINE MALEATE 10 MG PO TABS: 10.0000 mg | ORAL_TABLET | Freq: Once | ORAL | Status: DC

## 2019-07-29 NOTE — Progress Notes (Signed)
Patient has been assessed, vital signs and labs have been reviewed by Dr. Katragadda. ANC, Creatinine, LFTs, and Platelets are within treatment parameters per Dr. Katragadda. The patient is good to proceed with treatment at this time.  

## 2019-07-29 NOTE — Patient Instructions (Signed)
[facility] at [facility] Discharge Instructions  You were seen today by Dr. Katragadda. He went over your recent lab results. He will see you back in 4 weeks for labs, treatment and follow up.   Thank you for choosing [facility] at [facility] to provide your oncology and hematology care.  To afford each patient quality time with our provider, please arrive at least 15 minutes before your scheduled appointment time.   If you have a lab appointment with the Cancer Center please come in thru the  Main Entrance and check in at the main information desk  You need to re-schedule your appointment should you arrive 10 or more minutes late.  We strive to give you quality time with our providers, and arriving late affects you and other patients whose appointments are after yours.  Also, if you no show three or more times for appointments you may be dismissed from the clinic at the providers discretion.     Again, thank you for choosing [facility].  Our hope is that these requests will decrease the amount of time that you wait before being seen by our physicians.       _____________________________________________________________  Should you have questions after your visit to [facility], please contact our office at [phone] between the hours of 8:00 a.m. and 4:30 p.m.  Voicemails left after 4:00 p.m. will not be returned until the following business day.  For prescription refill requests, have your pharmacy contact our office and allow 72 hours.    Cancer Center Support Programs:   > Cancer Support Group  2nd Tuesday of the month 1pm-2pm, Journey Room    

## 2019-07-29 NOTE — Progress Notes (Signed)
[place], [place] [address]   CLINIC:  Medical Oncology/Hematology  PCP:  Mikey Kirschner, MD [address] [place] [address] [phone]   REASON FOR VISIT:  Follow-up for multiple myeloma    BRIEF ONCOLOGIC HISTORY:  Oncology History  Multiple myeloma (Joanna Reid)  10/29/2017 Initial Diagnosis   Multiple myeloma (Joanna Reid)   11/01/2017 - 01/24/2018 Chemotherapy   The patient had bortezomib SQ (VELCADE) chemo injection 3 mg, 1.3 mg/m2 = 3 mg, Subcutaneous,  Once, 5 of 5 cycles Administration: 3 mg (11/01/2017), 3 mg (11/08/2017), 3 mg (11/05/2017), 3 mg (11/22/2017), 3 mg (11/12/2017), 3 mg (11/29/2017), 3 mg (11/26/2017), 3 mg (12/03/2017), 3 mg (12/13/2017), 3 mg (12/20/2017), 3 mg (12/17/2017), 3 mg (12/24/2017), 3 mg (01/03/2018), 3 mg (01/10/2018), 3 mg (01/07/2018), 3 mg (01/14/2018), 3 mg (01/24/2018)  for chemotherapy treatment.    06/11/2018 -  Chemotherapy   The patient had bortezomib SQ (VELCADE) chemo injection 3 mg, 1.3 mg/m2 = 3 mg, Subcutaneous,  Once, 32 of 37 cycles Administration: 3 mg (06/11/2018), 3 mg (07/29/2018), 3 mg (08/21/2018), 3 mg (09/04/2018), 3 mg (09/18/2018), 3 mg (10/02/2018), 3 mg (10/16/2018), 3 mg (10/29/2018), 3 mg (11/13/2018), 3 mg (06/18/2018), 3 mg (06/25/2018), 3 mg (07/01/2018), 3 mg (07/08/2018), 3 mg (07/15/2018), 3 mg (11/27/2018), 3 mg (12/11/2018), 3 mg (12/25/2018), 3 mg (01/08/2019), 3 mg (01/22/2019), 3 mg (02/05/2019), 3 mg (02/19/2019), 3 mg (03/05/2019), 3 mg (03/19/2019), 3 mg (04/02/2019), 3 mg (04/16/2019), 3 mg (04/30/2019), 3 mg (05/20/2019), 3 mg (06/03/2019), 3 mg (06/17/2019), 3 mg (07/01/2019), 3 mg (07/15/2019)  for chemotherapy treatment.       CANCER STAGING: Cancer Staging Multiple myeloma (Joanna Reid) Staging form: Plasma Cell Myeloma and Plasma Cell Disorders, AJCC 8th Edition - Clinical: No stage assigned - Unsigned - Clinical: No stage assigned - Unsigned    INTERVAL HISTORY:  [name] 54 y.o. female seen for  follow-up of multiple myeloma.  Numbness in the feet has been stable.  Appetite and energy levels are 100%.  Denies any nausea vomiting or diarrhea.  She will finish her Revlimid on 07/31/2019.  Denies any fevers or chills.  REVIEW OF SYSTEMS:  Review of Systems  Neurological: Positive for numbness.  All other systems reviewed and are negative.    PAST MEDICAL/SURGICAL HISTORY:  Past Medical History:  Diagnosis Date   Acid reflux    Allergic rhinitis    Cancer (HCC)    multiple myeloma   Diabetes mellitus    type 2   Gout    Gout    HBP (high blood pressure)    History of kidney stones    Migraines    Past Surgical History:  Procedure Laterality Date   CESAREAN SECTION     EXTRACORPOREAL SHOCK WAVE LITHOTRIPSY Left 10/10/2017   Procedure: LEFT EXTRACORPOREAL SHOCK WAVE LITHOTRIPSY (ESWL);  Surgeon: Bjorn Loser, MD;  Location: [facility];  Service: Urology;  Laterality: Left;   EYE SURGERY     HEMORRHOID SURGERY N/A 11/19/2012   Procedure: HEMORRHOIDECTOMY;  Surgeon: Jamesetta So, MD;  Location: [facility];  Service: General;  Laterality: N/A;   kidney stones  1998   LAPAROSCOPIC UNILATERAL SALPINGO OOPHERECTOMY  05/14/2012   Procedure: LAPAROSCOPIC UNILATERAL SALPINGO OOPHORECTOMY;  Surgeon: [name], MD;  Location: [facility];  Service: Gynecology;  Laterality: Right;  laparoscopic right salpingo-oophorectomy   PARTIAL HYSTERECTOMY     TONSILECTOMY, ADENOIDECTOMY, BILATERAL MYRINGOTOMY AND TUBES  VESICOVAGINAL FISTULA CLOSURE W/ TAH       SOCIAL HISTORY:  Social History   Socioeconomic History   Marital status: Married    Spouse name: Not on file   Number of children: Not on file   Years of education: 12   Highest education level: Not on file  Occupational History    Employer: [employer]  Tobacco Use   Smoking status: Former Smoker    Quit date: 02/27/1999    Years since quitting: 20.4   Smokeless tobacco: Never Used   Tobacco comment:  socially   Substance and Sexual Activity   Alcohol use: No    Alcohol/week: 0.0 standard drinks   Drug use: No   Sexual activity: Yes  Other Topics Concern   Not on file  Social History Narrative   Not on file   Social Determinants of Health   Financial Resource Strain:    Difficulty of Paying Living Expenses: Not on file  Food Insecurity:    Worried About Charity fundraiser in the Last Year: Not on file   [employer] of Food in the Last Year: Not on file  Transportation Needs:    Lack of Transportation (Medical): Not on file   Lack of Transportation (Non-Medical): Not on file  Physical Activity:    Days of Exercise per Week: Not on file   Minutes of Exercise per Session: Not on file  Stress:    Feeling of Stress : Not on file  Social Connections:    Frequency of Communication with Friends and Family: Not on file   Frequency of Social Gatherings with Friends and Family: Not on file   Attends Religious Services: Not on file   Active Member of Clubs or Organizations: Not on file   Attends Archivist Meetings: Not on file   Marital Status: Not on file  Intimate Partner Violence:    Fear of Current or Ex-Partner: Not on file   Emotionally Abused: Not on file   Physically Abused: Not on file   Sexually Abused: Not on file    FAMILY HISTORY:  Family History  Problem Relation Age of Onset   Arthritis Other    Cancer Other    Diabetes Other    Hypertension Mother    Dementia Mother    Diabetes Father    ALS Father    Diabetes Brother    Hypertension Brother    Cancer Paternal Aunt    COPD Maternal Grandmother    Cancer Maternal Grandfather    Anesthesia problems Paternal Grandfather     CURRENT MEDICATIONS:  Outpatient Encounter Medications as of 07/29/2019  Medication Sig Note   acyclovir (ZOVIRAX) 800 MG tablet Take 800 mg by mouth 2 (two) times daily.    allopurinol (ZYLOPRIM) 300 MG tablet TAKE 1 TABLET DAILY     aspirin EC 81 MG tablet Take 81 mg by mouth daily.    Bortezomib (VELCADE IJ) Inject as directed every 14 (fourteen) days.  08/09/2018: Due for next injection on 08/13/2018   bumetanide (BUMEX) 1 MG tablet Take 1 tablet (1 mg total) by mouth every morning. (Patient taking differently: Take 0.5 mg by mouth every morning. )    carvedilol (COREG) 25 MG tablet Take 25 mg by mouth 2 (two) times daily.    diazepam (VALIUM) 5 MG tablet TAKE 1 TABLET BY MOUTH AT BEDTIME    Docosanol 10 % CREA Apply 0.5 inches topically 2 (two) times a day.    docusate  sodium (COLACE) 100 MG capsule Take 100 mg by mouth daily.     fluticasone (FLONASE) 50 MCG/ACT nasal spray Place 2 sprays into the nose 2 (two) times daily.    lenalidomide (REVLIMID) 10 MG capsule TAKE 1 CAPSULE BY MOUTH  DAILY FOR 21 DAYS ON, THEN  7 DAYS OFF    losartan (COZAAR) 25 MG tablet Take 25 mg by mouth daily.     magnesium oxide (MAG-OX) 400 (241.3 Mg) MG tablet Take 1 tablet (400 mg total) by mouth 3 (three) times daily.    metFORMIN (GLUCOPHAGE) 500 MG tablet TAKE 1 TABLET BY MOUTH ONCE DAILY WITH BREAKFAST (Patient taking differently: Take 1,000 mg by mouth 2 (two) times daily with a meal. )    omeprazole (PRILOSEC) 20 MG capsule TAKE 1 CAPSULE DAILY    potassium chloride SA (KLOR-CON) 20 MEQ tablet TAKE (1) TABLET BY MOUTH TWICE DAILY.    pregabalin (LYRICA) 200 MG capsule TAKE (1) CAPSULE BY MOUTH TWICE DAILY.    rosuvastatin (CRESTOR) 5 MG tablet Take 5 mg by mouth daily.     TRULICITY 0.98 JX/9.1YN SOPN Inject 0.75 mg into the skin every Monday.     albuterol (PROVENTIL HFA;VENTOLIN HFA) 108 (90 Base) MCG/ACT inhaler Inhale 2 puffs into the lungs every 4 (four) hours as needed for wheezing or shortness of breath. (Patient not taking: Reported on 07/29/2019)    HYDROcodone-acetaminophen (NORCO/VICODIN) 5-325 MG tablet Take 1 tablet by mouth at bedtime as needed for moderate pain. (Patient not taking: Reported on 07/29/2019)      ondansetron (ZOFRAN) 8 MG tablet Take 1 tablet (8 mg total) by mouth every 8 (eight) hours as needed. (Patient not taking: Reported on 07/29/2019)    prochlorperazine (COMPAZINE) 10 MG tablet Take 1 tablet (10 mg total) by mouth every 6 (six) hours as needed for nausea or vomiting. (Patient not taking: Reported on 07/29/2019)    valACYclovir (VALTREX) 1000 MG tablet TAKE 2 TABLETS NOW, THEN 2 TABLETS 12 HOURS LATER. (Patient not taking: Reported on 07/29/2019)    [DISCONTINUED] amoxicillin (AMOXIL) 500 MG capsule Take 500 mg by mouth 4 (four) times daily.    [DISCONTINUED] fluconazole (DIFLUCAN) 200 MG tablet Take 1 tablet (200 mg total) by mouth daily.    No facility-administered encounter medications on file as of 07/29/2019.    ALLERGIES:  No Known Allergies   PHYSICAL EXAM:  ECOG Performance status: 1  Vitals:   07/29/19 0937  BP: 133/79  Pulse: (!) 57  Resp: 20  Temp: 97.7 F (36.5 C)  SpO2: 98%   Filed Weights   07/29/19 0937  Weight: 250 lb 9.6 oz (113.7 kg)    Physical Exam Vitals reviewed.  Constitutional:      Appearance: Normal appearance.  Cardiovascular:     Rate and Rhythm: Normal rate and regular rhythm.     Heart sounds: Normal heart sounds.  Pulmonary:     Effort: Pulmonary effort is normal.     Breath sounds: Normal breath sounds.  Abdominal:     General: There is no distension.     Palpations: Abdomen is soft. There is no mass.  Musculoskeletal:        General: No swelling.  Skin:    General: Skin is warm.  Neurological:     General: No focal deficit present.     Mental Status: She is alert and oriented to person, place, and time.  Psychiatric:        Mood and Affect: Mood normal.  Behavior: Behavior normal.      LABORATORY DATA:  I have reviewed the labs as listed.  CBC    Component Value Date/Time   WBC 2.5 (L) 07/29/2019 0911   RBC 3.66 (L) 07/29/2019 0911   HGB 11.2 (L) 07/29/2019 0911   HGB 14.2 08/02/2015 1113   HCT  34.4 (L) 07/29/2019 0911   HCT 41.1 08/02/2015 1113   PLT 139 (L) 07/29/2019 0911   PLT 371 08/02/2015 1113   MCV 94.0 07/29/2019 0911   MCV 84 08/02/2015 1113   MCH 30.6 07/29/2019 0911   MCHC 32.6 07/29/2019 0911   RDW 15.8 (H) 07/29/2019 0911   RDW 15.1 08/02/2015 1113   LYMPHSABS 0.9 07/29/2019 0911   LYMPHSABS 2.9 08/02/2015 1113   MONOABS 0.3 07/29/2019 0911   EOSABS 0.1 07/29/2019 0911   EOSABS 0.2 08/02/2015 1113   BASOSABS 0.0 07/29/2019 0911   BASOSABS 0.1 08/02/2015 1113   CMP Latest Ref Rng & Units 07/29/2019 07/15/2019 07/01/2019  Glucose 70 - 99 mg/dL 128(H) 157(H) 132(H)  BUN 6 - 20 mg/dL 20 25(H) 20  Creatinine 0.44 - 1.00 mg/dL 1.02(H) 1.08(H) 1.11(H)  Sodium 135 - 145 mmol/L 139 137 139  Potassium 3.5 - 5.1 mmol/L 3.6 3.7 3.5  Chloride 98 - 111 mmol/L 103 101 103  CO2 22 - 32 mmol/L _0 Calcium 8.9 - 10.3 mg/dL 9.1 9.5 9.2  Total Protein 6.5 - 8.1 g/dL 7.2 7.5 7.0  Total Bilirubin 0.3 - 1.2 mg/dL 0.7 0.8 0.8  Alkaline Phos 38 - 126 U/L 67 73 71  AST 15 - 41 U/L _1 ALT 0 - 44 U/L _2 DIAGNOSTIC IMAGING:  I have independently reviewed the scans and discussed with the patient.   I have reviewed Venita Lick LPN's note and agree with the documentation.  I personally performed a face-to-face visit, made revisions and my assessment and plan is as follows.    ASSESSMENT & PLAN:   Multiple myeloma ([place]) 1.  IgG lambda multiple myeloma, stage III, del 17p: - Presentation with lower back pain since March, presented to the emergency room on 10/25/2017 with right groin pain, CT scan showed multiple lytic lesions, multiple lytic lesions on skeletal survey - SPEP shows 1.5 g/dL of monoclonal protein, beta-2 microglobulin of 4.8, free lambda light chains of 2869, kappa by lambda ratio of less than 0, elevated LDH. 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  FISH panel shows -14, -12, 13 q.-/-13 and 17p-.  Bone marrow biopsy shows  90% plasma cells. -4 cycles of RVD from 11/01/2017 through 01/14/2018.  Labs done on 01/20/2018 showed M spike to be negative. - She was evaluated by Dr. Laverta [place] at [facility].  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 - Because of her high risk disease, consolidation with 2 cycles of chemotherapy was done with RVD from 06/11/2018 through 07/01/2018.  -Maintenance Velcade every 2 weeks and Revlimid 10 mg 3 weeks on 1 week off started on 07/29/2018.   -Myeloma labs from [place] on 02/09/2019 was negative. -We reviewed her labs.  Myeloma panel from today is pending.  She has mild leukopenia with white count of 2.5 and ANC of 1200.  She will complete her Revlimid on 07/31/2019. -She will proceed with Velcade today.  We will see her back in 4 weeks for follow-up. -She has follow-up appointment at [facility] on  08/25/2018.  2.  Bone strengthening agents: -Zometa was cut back to 3 mg due to increase in creatinine.  She received full dose on 06/03/2019.  Her creatinine today is 1.1.  3.  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  She is continuing Lyrica 200 mg twice daily which is helping.  4.  ID prophylaxis: -She will continue acyclovir twice daily.  5.  Hypomagnesemia: -Her magnesium today is 1.6.  She will continue magnesium 2 in the morning and 1 in the evening.  6.  Diabetes: -She will continue Metformin 1000 mg twice daily.  She will continue Trulicity every week.     Orders placed this encounter:  Orders Placed This Encounter  Procedures   CBC with Differential/Platelet   Comprehensive metabolic panel   Protein electrophoresis, serum   Kappa/lambda light chains   Lactate dehydrogenase   Magnesium      Derek Jack, MD [place] [phone]

## 2019-07-29 NOTE — Progress Notes (Signed)
1007 Labs,including ANC 1.2 and Mag 1.6, reviewed with and pt seen by Dr. Delton Coombes and pt approved for Velcade injection today per MD                                                                                    Joanna Reid tolerated Velcade injection well without complaints or incident. Pt discharged self ambulatory in satisfactory condition

## 2019-07-29 NOTE — Patient Instructions (Signed)
[facility] Discharge Instructions for Patients Receiving Chemotherapy   Beginning January 23rd 2017 lab work for the Cancer Center will be done in the  Main lab at [facility] on 1st floor. If you have a lab appointment with the Cancer Center please come in thru the  Main Entrance and check in at the main information desk   Today you received the following chemotherapy agents Velcade injection. Follow-up as scheduled. Call clinic for any questions or concerns  To help prevent nausea and vomiting after your treatment, we encourage you to take your nausea medication   If you develop nausea and vomiting, or diarrhea that is not controlled by your medication, call the clinic.  The clinic phone number is [phone]. Office hours are Monday-Friday 8:30am-5:00pm.  BELOW ARE SYMPTOMS THAT SHOULD BE REPORTED IMMEDIATELY:  *FEVER GREATER THAN 101.0 F  *CHILLS WITH OR WITHOUT FEVER  NAUSEA AND VOMITING THAT IS NOT CONTROLLED WITH YOUR NAUSEA MEDICATION  *UNUSUAL SHORTNESS OF BREATH  *UNUSUAL BRUISING OR BLEEDING  TENDERNESS IN MOUTH AND THROAT WITH OR WITHOUT PRESENCE OF ULCERS  *URINARY PROBLEMS  *BOWEL PROBLEMS  UNUSUAL RASH Items with * indicate a potential emergency and should be followed up as soon as possible. If you have an emergency after office hours please contact your primary care physician or go to the nearest emergency department.  Please call the clinic during office hours if you have any questions or concerns.   You may also contact the Patient Navigator at [phone] should you have any questions or need assistance in obtaining follow up care.      Resources For Cancer Patients and their Caregivers ? American Cancer Society: Can assist with transportation, wigs, general needs, runs Look Good Feel Better.        1-888-227-6333 ? Cancer Care: Provides financial assistance, online support groups, medication/co-pay assistance.   1-800-813-HOPE (4673) ? [facility] Assists [place] cancer patients and their families through emotional , educational and financial support.  336-427-4357 ? [place] DSS Where to apply for food stamps, Medicaid and utility assistance. 336-342-1394 ? [facility]: Transportation to medical appointments. 336-347-2287 ? Social Security Administration: May apply for disability if have a Stage IV cancer. 336-342-7796 1-800-772-1213 ? [place] Aging, Disability and Transit Services: Assists with nutrition, care and transit needs. 336-349-2343         

## 2019-07-30 LAB — PROTEIN ELECTROPHORESIS, SERUM
A/G Ratio: 1.1 (ref 0.7–1.7)
Albumin ELP: 3.5 g/dL (ref 2.9–4.4)
Alpha-1-Globulin: 0.2 g/dL (ref 0.0–0.4)
Alpha-2-Globulin: 0.7 g/dL (ref 0.4–1.0)
Beta Globulin: 1 g/dL (ref 0.7–1.3)
Gamma Globulin: 1.2 g/dL (ref 0.4–1.8)
Globulin, Total: 3.1 g/dL (ref 2.2–3.9)
Total Protein ELP: 6.6 g/dL (ref 6.0–8.5)

## 2019-07-30 LAB — IMMUNOFIXATION ELECTROPHORESIS
IgA: 160 mg/dL (ref 87–352)
IgG (Immunoglobin G), Serum: 1163 mg/dL (ref 586–1602)
IgM (Immunoglobulin M), Srm: 14 mg/dL — ABNORMAL LOW (ref 26–217)
Total Protein ELP: 7 g/dL (ref 6.0–8.5)

## 2019-07-30 LAB — KAPPA/LAMBDA LIGHT CHAINS
Kappa free light chain: 71.1 mg/L — ABNORMAL HIGH (ref 3.3–19.4)
Kappa, lambda light chain ratio: 1.81 — ABNORMAL HIGH (ref 0.26–1.65)
Lambda free light chains: 39.3 mg/L — ABNORMAL HIGH (ref 5.7–26.3)

## 2019-08-01 NOTE — Assessment & Plan Note (Signed)
1.  IgG lambda multiple myeloma, stage III, del 17p: - Presentation with lower back pain since March, presented to the emergency room on 10/25/2017 with right groin pain, CT scan showed multiple lytic lesions, multiple lytic lesions on skeletal survey - SPEP shows 1.5 g/dL of monoclonal protein, beta-2 microglobulin of 4.8, free lambda light chains of 2869, kappa by lambda ratio of less than 0, elevated LDH. 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  FISH panel shows -14, -12, 13 q.-/-13 and 17p-.  Bone marrow biopsy shows 90% plasma cells. -4 cycles of RVD from 11/01/2017 through 01/14/2018.  Labs done on 01/20/2018 showed M spike to be negative. - She was evaluated by Dr. Laverta Baltimore at [facility].  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 - Because of her high risk disease, consolidation with 2 cycles of chemotherapy was done with RVD from 06/11/2018 through 07/01/2018.  -Maintenance Velcade every 2 weeks and Revlimid 10 mg 3 weeks on 1 week off started on 07/29/2018.   -Myeloma labs from [place] on 02/09/2019 was negative. -We reviewed her labs.  Myeloma panel from today is pending.  She has mild leukopenia with white count of 2.5 and ANC of 1200.  She will complete her Revlimid on 07/31/2019. -She will proceed with Velcade today.  We will see her back in 4 weeks for follow-up. -She has follow-up appointment at [facility] on 08/25/2018.  2.  Bone strengthening agents: -Zometa was cut back to 3 mg due to increase in creatinine.  She received full dose on 06/03/2019.  Her creatinine today is 1.1.  3.  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  She is continuing Lyrica 200 mg twice daily which is helping.  4.  ID prophylaxis: -She will continue acyclovir twice daily.  5.  Hypomagnesemia: -Her magnesium today is 1.6.  She will continue magnesium 2 in the morning and 1 in the evening.  6.   Diabetes: -She will continue Metformin 1000 mg twice daily.  She will continue Trulicity every week.

## 2019-08-05 ENCOUNTER — Other Ambulatory Visit (HOSPITAL_COMMUNITY): Payer: Self-pay | Admitting: Hematology

## 2019-08-05 DIAGNOSIS — T451X5A Adverse effect of antineoplastic and immunosuppressive drugs, initial encounter: Secondary | ICD-10-CM

## 2019-08-05 DIAGNOSIS — C9000 Multiple myeloma not having achieved remission: Secondary | ICD-10-CM

## 2019-08-05 DIAGNOSIS — G620 Drug-induced polyneuropathy: Secondary | ICD-10-CM

## 2019-08-12 ENCOUNTER — Inpatient Hospital Stay (HOSPITAL_COMMUNITY): Payer: BC Managed Care – PPO

## 2019-08-12 ENCOUNTER — Encounter: Payer: Self-pay | Admitting: Family Medicine

## 2019-08-12 ENCOUNTER — Other Ambulatory Visit: Payer: Self-pay

## 2019-08-12 ENCOUNTER — Inpatient Hospital Stay (HOSPITAL_COMMUNITY): Payer: BC Managed Care – PPO | Attending: Hematology

## 2019-08-12 VITALS — BP 118/71 | HR 64 | Temp 97.1°F | Resp 18 | Wt 249.9 lb

## 2019-08-12 DIAGNOSIS — Z5112 Encounter for antineoplastic immunotherapy: Secondary | ICD-10-CM | POA: Insufficient documentation

## 2019-08-12 DIAGNOSIS — C9000 Multiple myeloma not having achieved remission: Secondary | ICD-10-CM

## 2019-08-12 LAB — CBC WITH DIFFERENTIAL/PLATELET
Abs Immature Granulocytes: 0.01 10*3/uL (ref 0.00–0.07)
Basophils Absolute: 0.1 10*3/uL (ref 0.0–0.1)
Basophils Relative: 2 %
Eosinophils Absolute: 0 10*3/uL (ref 0.0–0.5)
Eosinophils Relative: 2 %
HCT: 35.2 % — ABNORMAL LOW (ref 36.0–46.0)
Hemoglobin: 11.3 g/dL — ABNORMAL LOW (ref 12.0–15.0)
Immature Granulocytes: 1 %
Lymphocytes Relative: 31 %
Lymphs Abs: 0.7 10*3/uL (ref 0.7–4.0)
MCH: 30.2 pg (ref 26.0–34.0)
MCHC: 32.1 g/dL (ref 30.0–36.0)
MCV: 94.1 fL (ref 80.0–100.0)
Monocytes Absolute: 0.3 10*3/uL (ref 0.1–1.0)
Monocytes Relative: 15 %
Neutro Abs: 1.1 10*3/uL — ABNORMAL LOW (ref 1.7–7.7)
Neutrophils Relative %: 49 %
Platelets: 197 10*3/uL (ref 150–400)
RBC: 3.74 MIL/uL — ABNORMAL LOW (ref 3.87–5.11)
RDW: 16.4 % — ABNORMAL HIGH (ref 11.5–15.5)
WBC: 2.2 10*3/uL — ABNORMAL LOW (ref 4.0–10.5)
nRBC: 0 % (ref 0.0–0.2)

## 2019-08-12 LAB — COMPREHENSIVE METABOLIC PANEL
ALT: 15 U/L (ref 0–44)
AST: 18 U/L (ref 15–41)
Albumin: 4.3 g/dL (ref 3.5–5.0)
Alkaline Phosphatase: 70 U/L (ref 38–126)
Anion gap: 10 (ref 5–15)
BUN: 21 mg/dL — ABNORMAL HIGH (ref 6–20)
CO2: 25 mmol/L (ref 22–32)
Calcium: 9.7 mg/dL (ref 8.9–10.3)
Chloride: 102 mmol/L (ref 98–111)
Creatinine, Ser: 1 mg/dL (ref 0.44–1.00)
GFR calc Af Amer: >60 mL/min (ref >60)
GFR calc non Af Amer: >60 mL/min (ref >60)
Glucose, Bld: 152 mg/dL — ABNORMAL HIGH (ref 70–99)
Potassium: 3.7 mmol/L (ref 3.5–5.1)
Sodium: 137 mmol/L (ref 135–145)
Total Bilirubin: 0.8 mg/dL (ref 0.3–1.2)
Total Protein: 7.4 g/dL (ref 6.5–8.1)

## 2019-08-12 LAB — MAGNESIUM: Magnesium: 1.6 mg/dL — ABNORMAL LOW (ref 1.7–2.4)

## 2019-08-12 LAB — LACTATE DEHYDROGENASE: LDH: 109 U/L (ref 98–192)

## 2019-08-12 MED ORDER — PROCHLORPERAZINE MALEATE 10 MG PO TABS: 10.0000 mg | ORAL_TABLET | Freq: Once | ORAL | Status: DC

## 2019-08-12 MED ORDER — BORTEZOMIB CHEMO SQ INJECTION 3.5 MG (2.5MG/ML)
1.3000 mg/m2 | Freq: Once | INTRAMUSCULAR | Status: AC
  Administered 2019-08-12: 12:00:00 3 mg via SUBCUTANEOUS
  Filled 2019-08-12: qty 1.2

## 2019-08-12 NOTE — Progress Notes (Signed)
Labs drawn today for [facility] per their orders.  Labs prepared and mailed off per their instructions.  Patient tolerated lab draw without incidence.

## 2019-08-12 NOTE — Progress Notes (Signed)
Patient presents today for Velcade injection. Vital signs within parameters for treatment. ANC 1.1.   Labs reviewed by [facility] NP and message received to proceed with treatment.   Treatment given today per MD orders. Tolerated without adverse affects. Vital signs stable. No complaints at this time. Discharged from clinic ambulatory. F/U with [facility] as scheduled.

## 2019-08-13 LAB — PROTEIN ELECTROPHORESIS, SERUM
A/G Ratio: 1.2 (ref 0.7–1.7)
Albumin ELP: 3.7 g/dL (ref 2.9–4.4)
Alpha-1-Globulin: 0.2 g/dL (ref 0.0–0.4)
Alpha-2-Globulin: 0.7 g/dL (ref 0.4–1.0)
Beta Globulin: 1 g/dL (ref 0.7–1.3)
Gamma Globulin: 1.1 g/dL (ref 0.4–1.8)
Globulin, Total: 3.2 g/dL (ref 2.2–3.9)
Total Protein ELP: 6.9 g/dL (ref 6.0–8.5)

## 2019-08-13 LAB — KAPPA/LAMBDA LIGHT CHAINS
Kappa free light chain: 50.3 mg/L — ABNORMAL HIGH (ref 3.3–19.4)
Kappa, lambda light chain ratio: 1.58 (ref 0.26–1.65)
Lambda free light chains: 31.9 mg/L — ABNORMAL HIGH (ref 5.7–26.3)

## 2019-08-17 ENCOUNTER — Other Ambulatory Visit (HOSPITAL_COMMUNITY): Payer: Self-pay | Admitting: Hematology

## 2019-08-26 ENCOUNTER — Inpatient Hospital Stay (HOSPITAL_COMMUNITY): Payer: BC Managed Care – PPO | Admitting: Hematology

## 2019-08-26 ENCOUNTER — Encounter (HOSPITAL_COMMUNITY): Payer: Self-pay | Admitting: Hematology

## 2019-08-26 ENCOUNTER — Other Ambulatory Visit: Payer: Self-pay

## 2019-08-26 ENCOUNTER — Inpatient Hospital Stay (HOSPITAL_COMMUNITY): Payer: BC Managed Care – PPO

## 2019-08-26 VITALS — BP 119/81 | HR 68 | Temp 96.9°F | Resp 19 | Wt 245.0 lb

## 2019-08-26 DIAGNOSIS — C9000 Multiple myeloma not having achieved remission: Secondary | ICD-10-CM

## 2019-08-26 DIAGNOSIS — Z5112 Encounter for antineoplastic immunotherapy: Secondary | ICD-10-CM | POA: Diagnosis not present

## 2019-08-26 LAB — CBC WITH DIFFERENTIAL/PLATELET
Abs Immature Granulocytes: 0.01 10*3/uL (ref 0.00–0.07)
Basophils Absolute: 0 10*3/uL (ref 0.0–0.1)
Basophils Relative: 1 %
Eosinophils Absolute: 0 10*3/uL (ref 0.0–0.5)
Eosinophils Relative: 1 %
HCT: 34 % — ABNORMAL LOW (ref 36.0–46.0)
Hemoglobin: 11.1 g/dL — ABNORMAL LOW (ref 12.0–15.0)
Immature Granulocytes: 0 %
Lymphocytes Relative: 32 %
Lymphs Abs: 1.2 10*3/uL (ref 0.7–4.0)
MCH: 30.5 pg (ref 26.0–34.0)
MCHC: 32.6 g/dL (ref 30.0–36.0)
MCV: 93.4 fL (ref 80.0–100.0)
Monocytes Absolute: 0.5 10*3/uL (ref 0.1–1.0)
Monocytes Relative: 13 %
Neutro Abs: 2 10*3/uL (ref 1.7–7.7)
Neutrophils Relative %: 53 %
Platelets: 167 10*3/uL (ref 150–400)
RBC: 3.64 MIL/uL — ABNORMAL LOW (ref 3.87–5.11)
RDW: 15.9 % — ABNORMAL HIGH (ref 11.5–15.5)
WBC: 3.8 10*3/uL — ABNORMAL LOW (ref 4.0–10.5)
nRBC: 0 % (ref 0.0–0.2)

## 2019-08-26 LAB — COMPREHENSIVE METABOLIC PANEL
ALT: 17 U/L (ref 0–44)
AST: 16 U/L (ref 15–41)
Albumin: 4 g/dL (ref 3.5–5.0)
Alkaline Phosphatase: 66 U/L (ref 38–126)
Anion gap: 11 (ref 5–15)
BUN: 20 mg/dL (ref 6–20)
CO2: 26 mmol/L (ref 22–32)
Calcium: 9.4 mg/dL (ref 8.9–10.3)
Chloride: 103 mmol/L (ref 98–111)
Creatinine, Ser: 1.03 mg/dL — ABNORMAL HIGH (ref 0.44–1.00)
GFR calc Af Amer: >60 mL/min (ref >60)
GFR calc non Af Amer: >60 mL/min (ref >60)
Glucose, Bld: 111 mg/dL — ABNORMAL HIGH (ref 70–99)
Potassium: 3.8 mmol/L (ref 3.5–5.1)
Sodium: 140 mmol/L (ref 135–145)
Total Bilirubin: 1.1 mg/dL (ref 0.3–1.2)
Total Protein: 7.3 g/dL (ref 6.5–8.1)

## 2019-08-26 LAB — LACTATE DEHYDROGENASE: LDH: 103 U/L (ref 98–192)

## 2019-08-26 LAB — MAGNESIUM: Magnesium: 1.6 mg/dL — ABNORMAL LOW (ref 1.7–2.4)

## 2019-08-26 MED ORDER — BORTEZOMIB CHEMO SQ INJECTION 3.5 MG (2.5MG/ML)
1.3000 mg/m2 | Freq: Once | INTRAMUSCULAR | Status: AC
  Administered 2019-08-26: 3 mg via SUBCUTANEOUS
  Filled 2019-08-26: qty 1.2

## 2019-08-26 MED ORDER — PROCHLORPERAZINE MALEATE 10 MG PO TABS: 10.0000 mg | ORAL_TABLET | Freq: Once | ORAL | Status: DC

## 2019-08-26 NOTE — Patient Instructions (Signed)
[facility] Discharge Instructions for Patients Receiving Chemotherapy   Beginning January 23rd 2017 lab work for the Cancer Center will be done in the  Main lab at [facility] on 1st floor. If you have a lab appointment with the Cancer Center please come in thru the  Main Entrance and check in at the main information desk   Today you received the following chemotherapy agents Velcade injection. Follow-up as scheduled. Call clinic for any questions or concerns  To help prevent nausea and vomiting after your treatment, we encourage you to take your nausea medication   If you develop nausea and vomiting, or diarrhea that is not controlled by your medication, call the clinic.  The clinic phone number is [phone]. Office hours are Monday-Friday 8:30am-5:00pm.  BELOW ARE SYMPTOMS THAT SHOULD BE REPORTED IMMEDIATELY:  *FEVER GREATER THAN 101.0 F  *CHILLS WITH OR WITHOUT FEVER  NAUSEA AND VOMITING THAT IS NOT CONTROLLED WITH YOUR NAUSEA MEDICATION  *UNUSUAL SHORTNESS OF BREATH  *UNUSUAL BRUISING OR BLEEDING  TENDERNESS IN MOUTH AND THROAT WITH OR WITHOUT PRESENCE OF ULCERS  *URINARY PROBLEMS  *BOWEL PROBLEMS  UNUSUAL RASH Items with * indicate a potential emergency and should be followed up as soon as possible. If you have an emergency after office hours please contact your primary care physician or go to the nearest emergency department.  Please call the clinic during office hours if you have any questions or concerns.   You may also contact the Patient Navigator at [phone] should you have any questions or need assistance in obtaining follow up care.      Resources For Cancer Patients and their Caregivers ? American Cancer Society: Can assist with transportation, wigs, general needs, runs Look Good Feel Better.        1-888-227-6333 ? Cancer Care: Provides financial assistance, online support groups, medication/co-pay assistance.   1-800-813-HOPE (4673) ? [facility] Assists [place] cancer patients and their families through emotional , educational and financial support.  336-427-4357 ? [place] DSS Where to apply for food stamps, Medicaid and utility assistance. 336-342-1394 ? [facility]: Transportation to medical appointments. 336-347-2287 ? Social Security Administration: May apply for disability if have a Stage IV cancer. 336-342-7796 1-800-772-1213 ? [place] Aging, Disability and Transit Services: Assists with nutrition, care and transit needs. 336-349-2343         

## 2019-08-26 NOTE — Progress Notes (Signed)
[place], [place] [address]   CLINIC:  Medical Oncology/Hematology  PCP:  Mikey Kirschner, MD [address] [place] [address] [phone]   REASON FOR VISIT:  Follow-up for multiple myeloma    BRIEF ONCOLOGIC HISTORY:  Oncology History  Multiple myeloma ([place])  10/29/2017 Initial Diagnosis   Multiple myeloma ([place])   11/01/2017 - 01/24/2018 Chemotherapy   The patient had bortezomib SQ (VELCADE) chemo injection 3 mg, 1.3 mg/m2 = 3 mg, Subcutaneous,  Once, 5 of 5 cycles Administration: 3 mg (11/01/2017), 3 mg (11/08/2017), 3 mg (11/05/2017), 3 mg (11/22/2017), 3 mg (11/12/2017), 3 mg (11/29/2017), 3 mg (11/26/2017), 3 mg (12/03/2017), 3 mg (12/13/2017), 3 mg (12/20/2017), 3 mg (12/17/2017), 3 mg (12/24/2017), 3 mg (01/03/2018), 3 mg (01/10/2018), 3 mg (01/07/2018), 3 mg (01/14/2018), 3 mg (01/24/2018)  for chemotherapy treatment.    06/11/2018 -  Chemotherapy   The patient had bortezomib SQ (VELCADE) chemo injection 3 mg, 1.3 mg/m2 = 3 mg, Subcutaneous,  Once, 34 of 39 cycles Administration: 3 mg (06/11/2018), 3 mg (07/29/2018), 3 mg (08/21/2018), 3 mg (09/04/2018), 3 mg (09/18/2018), 3 mg (10/02/2018), 3 mg (10/16/2018), 3 mg (10/29/2018), 3 mg (11/13/2018), 3 mg (06/18/2018), 3 mg (06/25/2018), 3 mg (07/01/2018), 3 mg (07/08/2018), 3 mg (07/15/2018), 3 mg (11/27/2018), 3 mg (12/11/2018), 3 mg (12/25/2018), 3 mg (01/08/2019), 3 mg (01/22/2019), 3 mg (02/05/2019), 3 mg (02/19/2019), 3 mg (03/05/2019), 3 mg (03/19/2019), 3 mg (04/02/2019), 3 mg (04/16/2019), 3 mg (04/30/2019), 3 mg (05/20/2019), 3 mg (06/03/2019), 3 mg (06/17/2019), 3 mg (07/01/2019), 3 mg (07/15/2019), 3 mg (07/29/2019), 3 mg (08/12/2019), 3 mg (08/26/2019)  for chemotherapy treatment.       CANCER STAGING: Cancer Staging Multiple myeloma ([place]) Staging form: Plasma Cell Myeloma and Plasma Cell Disorders, AJCC 8th Edition - Clinical: No stage assigned - Unsigned - Clinical: No stage assigned - Unsigned    INTERVAL  HISTORY:  Ms. Rumsey 54 y.o. female seen for follow-up of multiple myeloma.  She is tolerating Lyrica very well and her neuropathy is staying stable.  She is also continuing to tolerate Revlimid and Velcade very well.  Appetite and energy levels are 75%.  She has recently seen Dr. Laverta Baltimore at [facility] for follow-up.  She had some tooth abscess and is currently receiving antibiotic amoxicillin.  Denies any fevers or chills.  Denies any new onset pains.  REVIEW OF SYSTEMS:  Review of Systems  Neurological: Positive for numbness.  All other systems reviewed and are negative.    PAST MEDICAL/SURGICAL HISTORY:  Past Medical History:  Diagnosis Date  . Acid reflux   . Allergic rhinitis   . Cancer ([place])    multiple myeloma  . Diabetes mellitus    type 2  . Gout   . Gout   . HBP (high blood pressure)   . History of kidney stones   . Migraines    Past Surgical History:  Procedure Laterality Date  . CESAREAN SECTION    . EXTRACORPOREAL SHOCK WAVE LITHOTRIPSY Left 10/10/2017   Procedure: LEFT EXTRACORPOREAL SHOCK WAVE LITHOTRIPSY (ESWL);  Surgeon: Bjorn Loser, MD;  Location: [facility];  Service: Urology;  Laterality: Left;  . EYE SURGERY    . HEMORRHOID SURGERY N/A 11/19/2012   Procedure: HEMORRHOIDECTOMY;  Surgeon: Jamesetta So, MD;  Location: [facility];  Service: General;  Laterality: N/A;  . kidney stones  1998  . LAPAROSCOPIC UNILATERAL SALPINGO OOPHERECTOMY  05/14/2012   Procedure: LAPAROSCOPIC  UNILATERAL SALPINGO OOPHORECTOMY;  Surgeon: [name], MD;  Location: [facility];  Service: Gynecology;  Laterality: Right;  laparoscopic right salpingo-oophorectomy  . PARTIAL HYSTERECTOMY    . TONSILECTOMY, ADENOIDECTOMY, BILATERAL MYRINGOTOMY AND TUBES    . VESICOVAGINAL FISTULA CLOSURE W/ TAH       SOCIAL HISTORY:  Social History   Socioeconomic History  . Marital status: Married    Spouse name: Not on file  . Number of children: Not on file  . Years of education: 28  .  Highest education level: Not on file  Occupational History    Employer: [employer]  Tobacco Use  . Smoking status: Former Smoker    Quit date: 02/27/1999    Years since quitting: 20.5  . Smokeless tobacco: Never Used  . Tobacco comment: socially   Substance and Sexual Activity  . Alcohol use: No    Alcohol/week: 0.0 standard drinks  . Drug use: No  . Sexual activity: Yes  Other Topics Concern  . Not on file  Social History Narrative  . Not on file   Social Determinants of Health   Financial Resource Strain:   . Difficulty of Paying Living Expenses: Not on file  Food Insecurity:   . Worried About Charity fundraiser in the Last Year: Not on file  . Ran Out of Food in the Last Year: Not on file  Transportation Needs:   . Lack of Transportation (Medical): Not on file  . Lack of Transportation (Non-Medical): Not on file  Physical Activity:   . Days of Exercise per Week: Not on file  . Minutes of Exercise per Session: Not on file  Stress:   . Feeling of Stress : Not on file  Social Connections:   . Frequency of Communication with Friends and Family: Not on file  . Frequency of Social Gatherings with Friends and Family: Not on file  . Attends Religious Services: Not on file  . Active Member of Clubs or Organizations: Not on file  . Attends Archivist Meetings: Not on file  . Marital Status: Not on file  Intimate Partner Violence:   . Fear of Current or Ex-Partner: Not on file  . Emotionally Abused: Not on file  . Physically Abused: Not on file  . Sexually Abused: Not on file    FAMILY HISTORY:  Family History  Problem Relation Age of Onset  . Arthritis Other   . Cancer Other   . Diabetes Other   . Hypertension Mother   . Dementia Mother   . Diabetes Father   . ALS Father   . Diabetes Brother   . Hypertension Brother   . Cancer Paternal Aunt   . COPD Maternal Grandmother   . Cancer Maternal Grandfather   . Anesthesia problems Paternal Grandfather      CURRENT MEDICATIONS:  Outpatient Encounter Medications as of 08/26/2019  Medication Sig Note  . acyclovir (ZOVIRAX) 800 MG tablet TAKE (1) TABLET BY MOUTH TWICE DAILY.   Marland Kitchen allopurinol (ZYLOPRIM) 300 MG tablet TAKE 1 TABLET DAILY   . aspirin EC 81 MG tablet Take 81 mg by mouth daily.   . Bortezomib (VELCADE IJ) Inject as directed every 14 (fourteen) days.  08/09/2018: Due for next injection on 08/13/2018  . bumetanide (BUMEX) 0.5 MG tablet Take 0.5 mg by mouth daily.   . carvedilol (COREG) 25 MG tablet Take 25 mg by mouth 2 (two) times daily.   Marland Kitchen docusate sodium (COLACE) 100 MG capsule Take 100  mg by mouth daily.    Marland Kitchen lenalidomide (REVLIMID) 10 MG capsule TAKE 1 CAPSULE BY MOUTH  DAILY FOR 21 DAYS ON, THEN  7 DAYS OFF   . losartan (COZAAR) 25 MG tablet Take 25 mg by mouth daily.    . magnesium oxide (MAG-OX) 400 (241.3 Mg) MG tablet Take 1 tablet (400 mg total) by mouth 3 (three) times daily.   . metFORMIN (GLUCOPHAGE) 500 MG tablet TAKE 1 TABLET BY MOUTH ONCE DAILY WITH BREAKFAST (Patient taking differently: Take 1,000 mg by mouth 2 (two) times daily with a meal. )   . omeprazole (PRILOSEC) 20 MG capsule TAKE 1 CAPSULE DAILY   . potassium chloride SA (KLOR-CON) 20 MEQ tablet TAKE (1) TABLET BY MOUTH TWICE DAILY.   Marland Kitchen pregabalin (LYRICA) 200 MG capsule TAKE (1) CAPSULE BY MOUTH TWICE DAILY.   . rosuvastatin (CRESTOR) 5 MG tablet Take 5 mg by mouth daily.    . TRULICITY 8.85 OY/7.7AJ SOPN Inject 0.75 mg into the skin every Monday.    . [DISCONTINUED] Dulaglutide (TRULICITY) 1.5 OI/7.8MV SOPN Inject into the skin.   Marland Kitchen albuterol (PROVENTIL HFA;VENTOLIN HFA) 108 (90 Base) MCG/ACT inhaler Inhale 2 puffs into the lungs every 4 (four) hours as needed for wheezing or shortness of breath. (Patient not taking: Reported on 07/29/2019)   . diazepam (VALIUM) 5 MG tablet TAKE 1 TABLET BY MOUTH AT BEDTIME (Patient not taking: Reported on 08/26/2019)   . Docosanol 10 % CREA Apply 0.5 inches topically 2 (two)  times a day. (Patient not taking: Reported on 08/26/2019)   . fluticasone (FLONASE) 50 MCG/ACT nasal spray Place 2 sprays into the nose 2 (two) times daily. (Patient not taking: Reported on 08/26/2019)   . HYDROcodone-acetaminophen (NORCO/VICODIN) 5-325 MG tablet Take 1 tablet by mouth at bedtime as needed for moderate pain. (Patient not taking: Reported on 07/29/2019)   . ondansetron (ZOFRAN) 8 MG tablet Take 1 tablet (8 mg total) by mouth every 8 (eight) hours as needed. (Patient not taking: Reported on 07/29/2019)   . prochlorperazine (COMPAZINE) 10 MG tablet Take 1 tablet (10 mg total) by mouth every 6 (six) hours as needed for nausea or vomiting. (Patient not taking: Reported on 07/29/2019)   . valACYclovir (VALTREX) 1000 MG tablet TAKE 2 TABLETS NOW, THEN 2 TABLETS 12 HOURS LATER. (Patient not taking: Reported on 07/29/2019)   . [DISCONTINUED] bumetanide (BUMEX) 1 MG tablet Take 1 tablet (1 mg total) by mouth every morning. (Patient taking differently: Take 0.5 mg by mouth every morning. )    No facility-administered encounter medications on file as of 08/26/2019.    ALLERGIES:  No Known Allergies   PHYSICAL EXAM:  ECOG Performance status: 1  Vitals:   08/26/19 1433  BP: 119/81  Pulse: 68  Resp: 19  Temp: (!) 96.9 F (36.1 C)  SpO2: 97%   Filed Weights   08/26/19 1433  Weight: 245 lb (111.1 kg)    Physical Exam Vitals reviewed.  Constitutional:      Appearance: Normal appearance.  Cardiovascular:     Rate and Rhythm: Normal rate and regular rhythm.     Heart sounds: Normal heart sounds.  Pulmonary:     Effort: Pulmonary effort is normal.     Breath sounds: Normal breath sounds.  Abdominal:     General: There is no distension.     Palpations: Abdomen is soft. There is no mass.  Musculoskeletal:        General: No swelling.  Skin:  General: Skin is warm.  Neurological:     General: No focal deficit present.     Mental Status: She is alert and oriented to person,  place, and time.  Psychiatric:        Mood and Affect: Mood normal.        Behavior: Behavior normal.      LABORATORY DATA:  I have reviewed the labs as listed.  CBC    Component Value Date/Time   WBC 3.8 (L) 08/26/2019 1346   RBC 3.64 (L) 08/26/2019 1346   HGB 11.1 (L) 08/26/2019 1346   HGB 14.2 08/02/2015 1113   HCT 34.0 (L) 08/26/2019 1346   HCT 41.1 08/02/2015 1113   PLT 167 08/26/2019 1346   PLT 371 08/02/2015 1113   MCV 93.4 08/26/2019 1346   MCV 84 08/02/2015 1113   MCH 30.5 08/26/2019 1346   MCHC 32.6 08/26/2019 1346   RDW 15.9 (H) 08/26/2019 1346   RDW 15.1 08/02/2015 1113   LYMPHSABS 1.2 08/26/2019 1346   LYMPHSABS 2.9 08/02/2015 1113   MONOABS 0.5 08/26/2019 1346   EOSABS 0.0 08/26/2019 1346   EOSABS 0.2 08/02/2015 1113   BASOSABS 0.0 08/26/2019 1346   BASOSABS 0.1 08/02/2015 1113   CMP Latest Ref Rng & Units 08/26/2019 08/12/2019 07/29/2019  Glucose 70 - 99 mg/dL 111(H) 152(H) 128(H)  BUN 6 - 20 mg/dL 20 21(H) 20  Creatinine 0.44 - 1.00 mg/dL 1.03(H) 1.00 1.02(H)  Sodium 135 - 145 mmol/L 140 137 139  Potassium 3.5 - 5.1 mmol/L 3.8 3.7 3.6  Chloride 98 - 111 mmol/L 103 102 103  CO2 22 - 32 mmol/L 26 25 23   Calcium 8.9 - 10.3 mg/dL 9.4 9.7 9.1  Total Protein 6.5 - 8.1 g/dL 7.3 7.4 7.2  Total Bilirubin 0.3 - 1.2 mg/dL 1.1 0.8 0.7  Alkaline Phos 38 - 126 U/L 66 70 67  AST 15 - 41 U/L 16 18 19   ALT 0 - 44 U/L 17 15 19        DIAGNOSTIC IMAGING:  I have independently reviewed the scans and discussed with the patient.   I have reviewed Venita Lick LPN's note and agree with the documentation.  I personally performed a face-to-face visit, made revisions and my assessment and plan is as follows.    ASSESSMENT & PLAN:   Multiple myeloma ([place]) 1.  IgG lambda multiple myeloma, stage III, del 17p: - Presentation with lower back pain since March, presented to the emergency room on 10/25/2017 with right groin pain, CT scan showed multiple lytic lesions,  multiple lytic lesions on skeletal survey - SPEP shows 1.5 g/dL of monoclonal protein, beta-2 microglobulin of 4.8, free lambda light chains of 2869, kappa by lambda ratio of less than 0, elevated LDH. 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  FISH panel shows -14, -12, 13 q.-/-13 and 17p-.  Bone marrow biopsy shows 90% plasma cells. -4 cycles of RVD from 11/01/2017 through 01/14/2018.  Labs done on 01/20/2018 showed M spike to be negative. - She was evaluated by Dr. Laverta [place] at [facility].  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 - Because of her high risk disease, consolidation with 2 cycles of chemotherapy was done with RVD from 06/11/2018 through 07/01/2018.  -Maintenance Velcade every 2 weeks and Revlimid 10 mg 3 weeks on 1 week off started on 07/29/2018.   -We reviewed myeloma panel from 08/12/2019.  SPEP is negative.  Free light chain  ratio is 1.58, improved from 1.81 previously..  Kappa light chains improved to 50.3 from 71.1.  Lambda light chains of 31.9.  I have reviewed her records from [facility]. -Her CBC shows white count is 2.2 and ANC of 1100.  This is from Revlimid and Velcade.  Platelet count is normal. -She will proceed with her Velcade today and every 2 weeks.  I plan to reevaluate her in 2 months for follow-up with repeat myeloma labs.  2.  Bone strengthening agents: -She will continue Zometa 3 mg every 3 months.  Last dose was on 06/03/2019. -She has right upper tooth abscess.  She is taking amoxicillin 500 mg 3 times a day.  We will hold her dose today.  3.  Peripheral neuropathy: -She will continue Lyrica 200 mg twice daily.  4.  ID prophylaxis: -She will continue acyclovir twice daily.  5.  Hypomagnesemia: -Magnesium today is 1.6.  She is taking magnesium 2 in the morning and 1 in the evening.  6.  Diabetes: -She is continue Metformin and Trulicity.     Orders  placed this encounter:  Orders Placed This Encounter  Procedures  . CBC with Differential/Platelet  . Comprehensive metabolic panel  . Protein electrophoresis, serum  . Kappa/lambda light chains  . Lactate dehydrogenase  . Immunofixation electrophoresis      Derek Jack, MD [place] [phone]

## 2019-08-26 NOTE — Patient Instructions (Addendum)
[place] at [facility] Discharge Instructions  You were seen today by Dr. Delton Coombes. He went over your recent lab results. Continue medications and injections as directed. He will see you back in 2 months for labs and follow up.   Thank you for choosing [place] at [facility] to provide your oncology and hematology care.  To afford each patient quality time with our provider, please arrive at least 15 minutes before your scheduled appointment time.   If you have a lab appointment with the [facility] please come in thru the  Main Entrance and check in at the main information desk  You need to re-schedule your appointment should you arrive 10 or more minutes late.  We strive to give you quality time with our providers, and arriving late affects you and other patients whose appointments are after yours.  Also, if you no show three or more times for appointments you may be dismissed from the clinic at the providers discretion.     Again, thank you for choosing [facility].  Our hope is that these requests will decrease the amount of time that you wait before being seen by our physicians.       _____________________________________________________________  Should you have questions after your visit to [facility], please contact our office at [phone] between the hours of 8:00 a.m. and 4:30 p.m.  Voicemails left after 4:00 p.m. will not be returned until the following business day.  For prescription refill requests, have your pharmacy contact our office and allow 72 hours.    Cancer Center Support Programs:   > Cancer Support Group  2nd Tuesday of the month 1pm-2pm, Journey Room

## 2019-08-26 NOTE — Progress Notes (Signed)
1510 Labs reviewed with and pt seen by Dr. Delton Coombes and pt approved for Velcade injection but Zometa infusion to be held today per MD                                                                                    Joanna Reid tolerated Velcade injection well without complaints or incident. Pt discharged self ambulatory in satisfactory condition

## 2019-08-27 LAB — KAPPA/LAMBDA LIGHT CHAINS
Kappa free light chain: 50.5 mg/L — ABNORMAL HIGH (ref 3.3–19.4)
Kappa, lambda light chain ratio: 1.43 (ref 0.26–1.65)
Lambda free light chains: 35.2 mg/L — ABNORMAL HIGH (ref 5.7–26.3)

## 2019-08-28 LAB — IMMUNOFIXATION ELECTROPHORESIS
IgA: 146 mg/dL (ref 87–352)
IgG (Immunoglobin G), Serum: 1081 mg/dL (ref 586–1602)
IgM (Immunoglobulin M), Srm: 13 mg/dL — ABNORMAL LOW (ref 26–217)
Total Protein ELP: 7 g/dL (ref 6.0–8.5)

## 2019-08-28 LAB — PROTEIN ELECTROPHORESIS, SERUM
A/G Ratio: 1.3 (ref 0.7–1.7)
Albumin ELP: 3.8 g/dL (ref 2.9–4.4)
Alpha-1-Globulin: 0.3 g/dL (ref 0.0–0.4)
Alpha-2-Globulin: 0.6 g/dL (ref 0.4–1.0)
Beta Globulin: 0.9 g/dL (ref 0.7–1.3)
Gamma Globulin: 1.1 g/dL (ref 0.4–1.8)
Globulin, Total: 2.9 g/dL (ref 2.2–3.9)
Total Protein ELP: 6.7 g/dL (ref 6.0–8.5)

## 2019-09-02 ENCOUNTER — Ambulatory Visit (HOSPITAL_COMMUNITY): Payer: BC Managed Care – PPO | Admitting: Hematology

## 2019-09-02 ENCOUNTER — Other Ambulatory Visit (HOSPITAL_COMMUNITY): Payer: BC Managed Care – PPO

## 2019-09-02 ENCOUNTER — Ambulatory Visit (HOSPITAL_COMMUNITY): Payer: BC Managed Care – PPO

## 2019-09-06 NOTE — Assessment & Plan Note (Addendum)
1.  IgG lambda multiple myeloma, stage III, del 17p: - Presentation with lower back pain since March, presented to the emergency room on 10/25/2017 with right groin pain, CT scan showed multiple lytic lesions, multiple lytic lesions on skeletal survey - SPEP shows 1.5 g/dL of monoclonal protein, beta-2 microglobulin of 4.8, free lambda light chains of 2869, kappa by lambda ratio of less than 0, elevated LDH. 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  FISH panel shows -14, -12, 13 q.-/-13 and 17p-.  Bone marrow biopsy shows 90% plasma cells. -4 cycles of RVD from 11/01/2017 through 01/14/2018.  Labs done on 01/20/2018 showed M spike to be negative. - She was evaluated by Dr. Laverta [place] at [facility].  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 - Because of her high risk disease, consolidation with 2 cycles of chemotherapy was done with RVD from 06/11/2018 through 07/01/2018.  -Maintenance Velcade every 2 weeks and Revlimid 10 mg 3 weeks on 1 week off started on 07/29/2018.   -We reviewed myeloma panel from 08/12/2019.  SPEP is negative.  Free light chain ratio is 1.58, improved from 1.81 previously..  Kappa light chains improved to 50.3 from 71.1.  Lambda light chains of 31.9.  I have reviewed her records from [facility]. -Her CBC shows white count is 2.2 and ANC of 1100.  This is from Revlimid and Velcade.  Platelet count is normal. -She will proceed with her Velcade today and every 2 weeks.  I plan to reevaluate her in 2 months for follow-up with repeat myeloma labs.  2.  Bone strengthening agents: -She will continue Zometa 3 mg every 3 months.  Last dose was on 06/03/2019. -She has right upper tooth abscess.  She is taking amoxicillin 500 mg 3 times a day.  We will hold her dose today.  3.  Peripheral neuropathy: -She will continue Lyrica 200 mg twice daily.  4.  ID prophylaxis: -She will continue  acyclovir twice daily.  5.  Hypomagnesemia: -Magnesium today is 1.6.  She is taking magnesium 2 in the morning and 1 in the evening.  6.  Diabetes: -She is continue Metformin and Trulicity.

## 2019-09-07 ENCOUNTER — Other Ambulatory Visit (HOSPITAL_COMMUNITY): Payer: Self-pay | Admitting: Hematology

## 2019-09-09 ENCOUNTER — Inpatient Hospital Stay (HOSPITAL_COMMUNITY): Payer: BC Managed Care – PPO

## 2019-09-09 ENCOUNTER — Ambulatory Visit
Admission: EM | Admit: 2019-09-09 | Discharge: 2019-09-09 | Disposition: A | Discharge status: Home / Self Care | Payer: BC Managed Care – PPO | Attending: Emergency Medicine | Admitting: Emergency Medicine

## 2019-09-09 ENCOUNTER — Other Ambulatory Visit: Payer: Self-pay

## 2019-09-09 ENCOUNTER — Inpatient Hospital Stay (HOSPITAL_COMMUNITY): Payer: BC Managed Care – PPO | Attending: Hematology

## 2019-09-09 VITALS — BP 127/82 | HR 75 | Temp 97.7°F | Resp 18

## 2019-09-09 DIAGNOSIS — Z20822 Contact with and (suspected) exposure to covid-19: Secondary | ICD-10-CM | POA: Diagnosis not present

## 2019-09-09 DIAGNOSIS — C9000 Multiple myeloma not having achieved remission: Secondary | ICD-10-CM | POA: Insufficient documentation

## 2019-09-09 DIAGNOSIS — Z5112 Encounter for antineoplastic immunotherapy: Secondary | ICD-10-CM | POA: Insufficient documentation

## 2019-09-09 DIAGNOSIS — Z23 Encounter for immunization: Secondary | ICD-10-CM | POA: Insufficient documentation

## 2019-09-09 LAB — CBC WITH DIFFERENTIAL/PLATELET
Abs Immature Granulocytes: 0 10*3/uL (ref 0.00–0.07)
Basophils Absolute: 0.1 10*3/uL (ref 0.0–0.1)
Basophils Relative: 3 %
Eosinophils Absolute: 0.1 10*3/uL (ref 0.0–0.5)
Eosinophils Relative: 2 %
HCT: 34 % — ABNORMAL LOW (ref 36.0–46.0)
Hemoglobin: 10.9 g/dL — ABNORMAL LOW (ref 12.0–15.0)
Immature Granulocytes: 0 %
Lymphocytes Relative: 29 %
Lymphs Abs: 0.8 10*3/uL (ref 0.7–4.0)
MCH: 30.4 pg (ref 26.0–34.0)
MCHC: 32.1 g/dL (ref 30.0–36.0)
MCV: 94.7 fL (ref 80.0–100.0)
Monocytes Absolute: 0.3 10*3/uL (ref 0.1–1.0)
Monocytes Relative: 11 %
Neutro Abs: 1.6 10*3/uL — ABNORMAL LOW (ref 1.7–7.7)
Neutrophils Relative %: 55 %
Platelets: 208 10*3/uL (ref 150–400)
RBC: 3.59 MIL/uL — ABNORMAL LOW (ref 3.87–5.11)
RDW: 15.9 % — ABNORMAL HIGH (ref 11.5–15.5)
WBC: 2.9 10*3/uL — ABNORMAL LOW (ref 4.0–10.5)
nRBC: 0 % (ref 0.0–0.2)

## 2019-09-09 LAB — POC SARS CORONAVIRUS 2 AG -  ED: SARS Coronavirus 2 Ag: NEGATIVE

## 2019-09-09 LAB — COMPREHENSIVE METABOLIC PANEL WITH GFR
ALT: 13 U/L (ref 0–44)
AST: 20 U/L (ref 15–41)
Albumin: 4.1 g/dL (ref 3.5–5.0)
Alkaline Phosphatase: 61 U/L (ref 38–126)
Anion gap: 10 (ref 5–15)
BUN: 21 mg/dL — ABNORMAL HIGH (ref 6–20)
CO2: 25 mmol/L (ref 22–32)
Calcium: 9.3 mg/dL (ref 8.9–10.3)
Chloride: 103 mmol/L (ref 98–111)
Creatinine, Ser: 1 mg/dL (ref 0.44–1.00)
GFR calc Af Amer: >60 mL/min
GFR calc non Af Amer: >60 mL/min
Glucose, Bld: 102 mg/dL — ABNORMAL HIGH (ref 70–99)
Potassium: 3.9 mmol/L (ref 3.5–5.1)
Sodium: 138 mmol/L (ref 135–145)
Total Bilirubin: 0.8 mg/dL (ref 0.3–1.2)
Total Protein: 7.2 g/dL (ref 6.5–8.1)

## 2019-09-09 LAB — MAGNESIUM: Magnesium: 1.6 mg/dL — ABNORMAL LOW (ref 1.7–2.4)

## 2019-09-09 LAB — LACTATE DEHYDROGENASE: LDH: 120 U/L (ref 98–192)

## 2019-09-09 MED ORDER — PNEUMOCOCCAL 13-VAL CONJ VACC IM SUSP
0.5000 mL | Freq: Once | INTRAMUSCULAR | Status: AC
  Administered 2019-09-09: 0.5 mL via INTRAMUSCULAR
  Filled 2019-09-09: qty 0.5

## 2019-09-09 MED ORDER — DTAP-IPV-HIB VACCINE IM SUSR
0.5000 mL | Freq: Once | INTRAMUSCULAR | Status: AC
  Administered 2019-09-09: 0.5 mL via INTRAMUSCULAR
  Filled 2019-09-09: qty 1

## 2019-09-09 MED ORDER — BENZONATATE 100 MG PO CAPS: 100.0000 mg | ORAL_CAPSULE | Freq: Three times a day (TID) | ORAL | 0 refills | Status: DC

## 2019-09-09 MED ORDER — BORTEZOMIB CHEMO SQ INJECTION 3.5 MG (2.5MG/ML)
1.3000 mg/m2 | Freq: Once | INTRAMUSCULAR | Status: AC
  Administered 2019-09-09: 3 mg via SUBCUTANEOUS
  Filled 2019-09-09: qty 1.2

## 2019-09-09 MED ORDER — HEPATITIS B VAC RECOMBINANT 20 MCG/ML IJ SUSP
2.0000 mL | Freq: Once | INTRAMUSCULAR | Status: AC
  Administered 2019-09-09: 40 ug via INTRAMUSCULAR
  Filled 2019-09-09: qty 2

## 2019-09-09 MED ORDER — PROCHLORPERAZINE MALEATE 10 MG PO TABS: 10.0000 mg | ORAL_TABLET | Freq: Once | ORAL | Status: DC

## 2019-09-09 NOTE — ED Provider Notes (Addendum)
[facility]    CSN: [id number] Arrival date & time: 09/09/19  1620      History   Chief Complaint Chief Complaint  Patient presents with  . covid test    HPI Joanna Reid is a 54 y.o. female.   Who presented to the urgent care for Covid testing.  Reported positive Covid exposure.  Denies sick exposure to  flu or strep.  Denies recent travel.  Denies aggravating or alleviating symptoms.  Denies previous COVID infection.   Denies fever, chills, fatigue, nasal congestion, cough, rhinorrhea, sore throat, SOB, wheezing, chest pain, nausea, vomiting, changes in bowel or bladder habits.    The history is provided by the patient. No language interpreter was used.    Past Medical History:  Diagnosis Date  . Acid reflux   . Allergic rhinitis   . Cancer ([place])    multiple myeloma  . Diabetes mellitus    type 2  . Gout   . Gout   . HBP (high blood pressure)   . History of kidney stones   . Migraines     Patient Active Problem List   Diagnosis Date Noted  . Acute bronchiolitis due to respiratory syncytial virus (RSV) 08/11/2018  . Hypomagnesemia 08/10/2018  . Hyperlipidemia 08/09/2018  . Anemia 08/09/2018  . Leukopenia 08/09/2018  . Hypokalemia 08/09/2018  . Hypocalcemia 08/09/2018  . Lactic acidosis 08/09/2018  . Sepsis ([place]) 04/25/2018  . Goals of care, counseling/discussion 11/01/2017  . Multiple myeloma ([place]) 10/29/2017  . Hypercalcemia 10/29/2017  . Type 2 diabetes mellitus without complication, without long-term current use of insulin ([place]) 01/27/2016  . Morbid obesity due to excess calories ([place]) 01/27/2016  . Anxiety as acute reaction to exceptional stress 01/13/2015  . Attention deficit hyperactivity disorder (ADHD), 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  . Gout 01/20/2014  . GERD (gastroesophageal reflux disease) 01/20/2014  . Essential hypertension, benign 01/20/2014  . Plantar fasciitis of left foot 09/19/2011    Past Surgical History:  Procedure  Laterality Date  . CESAREAN SECTION    . EXTRACORPOREAL SHOCK WAVE LITHOTRIPSY Left 10/10/2017   Procedure: LEFT EXTRACORPOREAL SHOCK WAVE LITHOTRIPSY (ESWL);  Surgeon: Bjorn Loser, MD;  Location: [facility];  Service: Urology;  Laterality: Left;  . EYE SURGERY    . HEMORRHOID SURGERY N/A 11/19/2012   Procedure: HEMORRHOIDECTOMY;  Surgeon: Jamesetta So, MD;  Location: [facility];  Service: General;  Laterality: N/A;  . kidney stones  1998  . LAPAROSCOPIC UNILATERAL SALPINGO OOPHERECTOMY  05/14/2012   Procedure: LAPAROSCOPIC UNILATERAL SALPINGO OOPHORECTOMY;  Surgeon: [name], MD;  Location: [facility];  Service: Gynecology;  Laterality: Right;  laparoscopic right salpingo-oophorectomy  . PARTIAL HYSTERECTOMY    . TONSILECTOMY, ADENOIDECTOMY, BILATERAL MYRINGOTOMY AND TUBES    . VESICOVAGINAL FISTULA CLOSURE W/ TAH      OB History   No obstetric history on file.      Home Medications    Prior to Admission medications   Medication Sig Start Date End Date Taking? Authorizing Provider  acyclovir (ZOVIRAX) 800 MG tablet TAKE (1) TABLET BY MOUTH TWICE DAILY. 09/07/19  Yes Derek Jack, MD  allopurinol (ZYLOPRIM) 300 MG tablet TAKE 1 TABLET DAILY 07/07/19  Yes Mikey Kirschner, MD  amoxicillin (AMOXIL) 500 MG capsule TAKE 2 CAPSULES NOW; THENO1 CAPSULE 3 TIMES DAILYNUNTIL GONE.S 09/01/19  Yes [provider]  aspirin EC 81 MG tablet Take 81 mg by mouth daily.   Yes [provider]  Bortezomib (VELCADE IJ) Inject as  directed every 14 (fourteen) days.    Yes [provider]  bumetanide (BUMEX) 0.5 MG tablet Take 0.5 mg by mouth daily. 06/30/19  Yes [provider]  carvedilol (COREG) 25 MG tablet Take 25 mg by mouth 2 (two) times daily. 05/05/19  Yes [provider]  diazepam (VALIUM) 5 MG tablet TAKE 1 TABLET BY MOUTH AT BEDTIME 04/01/19  Yes Mikey Kirschner, MD  docusate sodium (COLACE) 100 MG capsule Take 100 mg by mouth daily.    Yes  [provider]  HYDROcodone-acetaminophen (NORCO/VICODIN) 5-325 MG tablet Take 1 tablet by mouth at bedtime as needed for moderate pain. 10/16/18  Yes Derek Jack, MD  lenalidomide (REVLIMID) 10 MG capsule TAKE 1 CAPSULE BY MOUTH  DAILY FOR 21 DAYS ON, THEN  7 DAYS OFF 08/17/19  Yes Derek Jack, MD  losartan (COZAAR) 25 MG tablet Take 25 mg by mouth daily.  04/22/19  Yes [provider]  magnesium oxide (MAG-OX) 400 (241.3 Mg) MG tablet Take 1 tablet (400 mg total) by mouth 3 (three) times daily. 10/02/18  Yes Derek Jack, MD  metFORMIN (GLUCOPHAGE) 500 MG tablet TAKE 1 TABLET BY MOUTH ONCE DAILY WITH BREAKFAST Patient taking differently: Take 1,000 mg by mouth 2 (two) times daily with a meal.  07/21/18  Yes Mikey Kirschner, MD  omeprazole (PRILOSEC) 20 MG capsule TAKE 1 CAPSULE DAILY 07/07/19  Yes Mikey Kirschner, MD  potassium chloride SA (KLOR-CON) 20 MEQ tablet TAKE (1) TABLET BY MOUTH TWICE DAILY. 06/15/19  Yes Mikey Kirschner, MD  pregabalin (LYRICA) 200 MG capsule TAKE (1) CAPSULE BY MOUTH TWICE DAILY. 08/05/19  Yes Derek Jack, MD  prochlorperazine (COMPAZINE) 10 MG tablet Take 1 tablet (10 mg total) by mouth every 6 (six) hours as needed for nausea or vomiting. 04/16/19  Yes Derek Jack, MD  rosuvastatin (CRESTOR) 5 MG tablet Take 5 mg by mouth daily.  05/14/18  Yes [provider]  TRULICITY 4.78 GN/5.6OZ SOPN Inject 0.75 mg into the skin every Monday.  05/14/18  Yes [provider]  albuterol (PROVENTIL HFA;VENTOLIN HFA) 108 (90 Base) MCG/ACT inhaler Inhale 2 puffs into the lungs every 4 (four) hours as needed for wheezing or shortness of breath. 08/11/18   Johnson, Clanford L, MD  benzonatate (TESSALON) 100 MG capsule Take 1 capsule (100 mg total) by mouth every 8 (eight) hours. 09/09/19   Brenleigh Collet, Darrelyn Hillock, FNP  Docosanol 10 % CREA Apply 0.5 inches topically 2 (two) times a day. Patient not taking: Reported on  08/26/2019 12/11/18   Derek Jack, MD  fluconazole (DIFLUCAN) 200 MG tablet Take 200 mg by mouth daily. 08/29/19   [provider]  fluticasone (FLONASE) 50 MCG/ACT nasal spray Place 2 sprays into the nose 2 (two) times daily. 04/21/19   Mikey Kirschner, MD  ondansetron (ZOFRAN) 8 MG tablet Take 1 tablet (8 mg total) by mouth every 8 (eight) hours as needed. Patient not taking: Reported on 07/29/2019 04/16/19   Derek Jack, MD  valACYclovir (VALTREX) 1000 MG tablet TAKE 2 TABLETS NOW, THEN 2 TABLETS 12 HOURS LATER. Patient not taking: Reported on 07/29/2019 03/26/19   Derek Jack, MD    Family History Family History  Problem Relation Age of Onset  . Arthritis Other   . Cancer Other   . Diabetes Other   . Hypertension Mother   . Dementia Mother   . Diabetes Father   . ALS Father   . Diabetes Brother   . Hypertension  Brother   . Cancer Paternal Aunt   . COPD Maternal Grandmother   . Cancer Maternal Grandfather   . Anesthesia problems Paternal Grandfather     Social History Social History   Tobacco Use  . Smoking status: Former Smoker    Quit date: 02/27/1999    Years since quitting: 20.5  . Smokeless tobacco: Never Used  . Tobacco comment: socially   Substance Use Topics  . Alcohol use: No    Alcohol/week: 0.0 standard drinks  . Drug use: No     Allergies   Patient has no known allergies.   Review of Systems Review of Systems  Constitutional: Negative.   HENT: Negative.   Respiratory: Negative.  Negative for cough.   Cardiovascular: Negative.   Gastrointestinal: Negative.   Neurological: Negative.      Physical Exam Triage Vital Signs ED Triage Vitals [09/09/19 1633]  Enc Vitals Group     BP (!) 155/97     Pulse Rate 79     Resp 18     Temp 98.2 F (36.8 C)     Temp Source Oral     SpO2 97 %     Weight 246 lb (111.6 kg)     Height 5' 4"  (1.626 m)     Head Circumference      Peak Flow      Pain Score 0     Pain Loc        Pain Edu?      Excl. in [place]?    No data found.  Updated Vital Signs BP (!) 155/97 (BP Location: Right Arm)   Pulse 79   Temp 98.2 F (36.8 C) (Oral)   Resp 18   Ht 5' 4"  (1.626 m)   Wt 246 lb (111.6 kg)   SpO2 97%   BMI 42.23 kg/m   Visual Acuity Right Eye Distance:   Left Eye Distance:   Bilateral Distance:    Right Eye Near:   Left Eye Near:    Bilateral Near:     Physical Exam Vitals and nursing note reviewed.  Constitutional:      General: She is not in acute distress.    Appearance: Normal appearance. She is normal weight. She is not ill-appearing or toxic-appearing.  HENT:     Head: Normocephalic.     Right Ear: Tympanic membrane, ear canal and external ear normal. There is no impacted cerumen.     Left Ear: Tympanic membrane, ear canal and external ear normal. There is no impacted cerumen.     Nose: Nose normal. No congestion.     Mouth/Throat:     Mouth: Mucous membranes are moist.     Pharynx: Oropharynx is clear. No oropharyngeal exudate or posterior oropharyngeal erythema.  Cardiovascular:     Rate and Rhythm: Normal rate and regular rhythm.     Pulses: Normal pulses.     Heart sounds: Normal heart sounds. No murmur.  Pulmonary:     Effort: Pulmonary effort is normal. No respiratory distress.     Breath sounds: Normal breath sounds. No wheezing or rhonchi.  Chest:     Chest wall: No tenderness.  Neurological:     Mental Status: She is alert and oriented to person, place, and time.      UC Treatments / Results  Labs (all labs ordered are listed, but only abnormal results are displayed) Labs Reviewed  NOVEL CORONAVIRUS, NAA  POC SARS CORONAVIRUS 2 AG -  ED  EKG   Radiology No results found.  Procedures Procedures (including critical care time)  Medications Ordered in UC Medications - No data to display  Initial Impression / Assessment and Plan / UC Course  I have reviewed the triage vital signs and the nursing  notes.  Pertinent labs & imaging results that were available during my care of the patient were reviewed by me and considered in my medical decision making (see chart for details).     Patient is stable for discharge. POCT COVID-19 test was ordered and was negative [facility] COVID-19 test was ordered Advised patient to quarantine To go to ED for worsening symptoms   Final Clinical Impressions(s) / UC Diagnoses   Final diagnoses:  Exposure to COVID-19 virus     Discharge Instructions     POCT COVID-19 test was negative  COVID testing ordered.  It will take between 2-7 days for test results.  Someone will contact you regarding abnormal results.    In the meantime: You should remain isolated in your home for 10 days from symptom onset AND greater than 24 hours after symptoms resolution (absence of fever without the use of fever-reducing medication and improvement in respiratory symptoms), whichever is longer Get plenty of rest and push fluids Use medications daily for symptom relief Use OTC medications like ibuprofen or tylenol as needed fever or pain Call or go to the ED if you have any new or worsening symptoms such as fever, worsening cough, shortness of breath, chest tightness, chest pain, turning blue, changes in mental status, etc...      ED Prescriptions    Medication Sig Dispense Auth. Provider   benzonatate (TESSALON) 100 MG capsule Take 1 capsule (100 mg total) by mouth every 8 (eight) hours. 21 capsule Kia Stavros, Darrelyn Hillock, FNP     PDMP not reviewed this encounter.   Emerson Monte, FNP 09/09/19 1712    Emerson Monte, FNP 09/09/19 [phone]

## 2019-09-09 NOTE — ED Triage Notes (Signed)
Pt reports family members have been diagnosed with covid and she is requesting test.  Pt says had a slight cough this morning but no other symptoms.

## 2019-09-09 NOTE — Discharge Instructions (Addendum)
POCT COVID-19 test was negative  COVID testing ordered.  It will take between 2-7 days for test results.  Someone will contact you regarding abnormal results.    In the meantime: You should remain isolated in your home for 10 days from symptom onset AND greater than 24 hours after symptoms resolution (absence of fever without the use of fever-reducing medication and improvement in respiratory symptoms), whichever is longer Get plenty of rest and push fluids Use medications daily for symptom relief Use OTC medications like ibuprofen or tylenol as needed fever or pain Call or go to the ED if you have any new or worsening symptoms such as fever, worsening cough, shortness of breath, chest tightness, chest pain, turning blue, changes in mental status, etc..Marland Kitchen

## 2019-09-09 NOTE — Patient Instructions (Signed)
[facility] Discharge Instructions for Patients Receiving Chemotherapy  Today you received the following chemotherapy agents   To help prevent nausea and vomiting after your treatment, we encourage you to take your nausea medication   If you develop nausea and vomiting that is not controlled by your nausea medication, call the clinic.   BELOW ARE SYMPTOMS THAT SHOULD BE REPORTED IMMEDIATELY:  *FEVER GREATER THAN 100.5 F  *CHILLS WITH OR WITHOUT FEVER  NAUSEA AND VOMITING THAT IS NOT CONTROLLED WITH YOUR NAUSEA MEDICATION  *UNUSUAL SHORTNESS OF BREATH  *UNUSUAL BRUISING OR BLEEDING  TENDERNESS IN MOUTH AND THROAT WITH OR WITHOUT PRESENCE OF ULCERS  *URINARY PROBLEMS  *BOWEL PROBLEMS  UNUSUAL RASH Items with * indicate a potential emergency and should be followed up as soon as possible.  Feel free to call the clinic should you have any questions or concerns. The clinic phone number is [phone].  Please show the CHEMO ALERT CARD at check-in to the Emergency Department and triage nurse.   

## 2019-09-09 NOTE — Progress Notes (Signed)
Labs meet parameters for velcade today. Proceed as planned. Patient has a root canal on 09-01-2019. MD notified and will hold zometa for 4 weeks per MD.   Immunizations given today per protocol. See MAR for details.   Treatment given per orders. Patient tolerated it well without problems. Vitals stable and discharged home from clinic ambulatory. Follow up as scheduled.

## 2019-09-10 LAB — NOVEL CORONAVIRUS, NAA: SARS-CoV-2, NAA: NOT DETECTED

## 2019-09-14 ENCOUNTER — Other Ambulatory Visit (HOSPITAL_COMMUNITY): Payer: Self-pay | Admitting: *Deleted

## 2019-09-14 MED ORDER — LENALIDOMIDE 10 MG PO CAPS: ORAL_CAPSULE | ORAL | 1 refills | Status: DC

## 2019-09-17 ENCOUNTER — Ambulatory Visit
Admission: EM | Admit: 2019-09-17 | Discharge: 2019-09-17 | Disposition: A | Discharge status: Home / Self Care | Payer: BC Managed Care – PPO | Attending: Emergency Medicine | Admitting: Emergency Medicine

## 2019-09-17 ENCOUNTER — Other Ambulatory Visit: Payer: Self-pay

## 2019-09-17 DIAGNOSIS — Z20822 Contact with and (suspected) exposure to covid-19: Secondary | ICD-10-CM

## 2019-09-17 DIAGNOSIS — J069 Acute upper respiratory infection, unspecified: Secondary | ICD-10-CM

## 2019-09-17 MED ORDER — FLUTICASONE PROPIONATE 50 MCG/ACT NA SUSP: 2.0000 | Freq: Every day | NASAL | 0 refills | Status: DC

## 2019-09-17 MED ORDER — CETIRIZINE HCL 10 MG PO TABS: 10.0000 mg | ORAL_TABLET | Freq: Every day | ORAL | 0 refills | Status: DC

## 2019-09-17 NOTE — ED Triage Notes (Signed)
Pt reports had covid exposure last Monday, was tested last wed and was negative.  Reports started having fever, nasal congestion, and unable to smell 2 days ago.

## 2019-09-17 NOTE — ED Provider Notes (Signed)
[facility]   [id number] 09/17/19 Arrival Time: 1001   CC: COVID symptoms  SUBJECTIVE: History from: patient.  Joanna Reid is a 54 y.o. female who presents with fever, tmax of 102.9 at home, 99.5 in office, nasal congestion, and loss of smell x 2 days.  COVID exposure last Monday.  Had COVID test last week that was negative, but was asymptomatic at that time.  Has tried OTC medications without relief.  Symptoms are made worse in the morning.  Denies previous COVID infection in the past.   Denies chills, sore throat, SOB, wheezing, chest pain, nausea, vomiting, changes in bowel or bladder habits.    ROS: As per HPI.  All other pertinent ROS negative.     Past Medical History:  Diagnosis Date  . Acid reflux   . Allergic rhinitis   . Cancer ([place])    multiple myeloma  . Diabetes mellitus    type 2  . Gout   . Gout   . HBP (high blood pressure)   . History of kidney stones   . Migraines    Past Surgical History:  Procedure Laterality Date  . CESAREAN SECTION    . EXTRACORPOREAL SHOCK WAVE LITHOTRIPSY Left 10/10/2017   Procedure: LEFT EXTRACORPOREAL SHOCK WAVE LITHOTRIPSY (ESWL);  Surgeon: Bjorn Loser, MD;  Location: [facility];  Service: Urology;  Laterality: Left;  . EYE SURGERY    . HEMORRHOID SURGERY N/A 11/19/2012   Procedure: HEMORRHOIDECTOMY;  Surgeon: Jamesetta So, MD;  Location: [facility];  Service: General;  Laterality: N/A;  . kidney stones  1998  . LAPAROSCOPIC UNILATERAL SALPINGO OOPHERECTOMY  05/14/2012   Procedure: LAPAROSCOPIC UNILATERAL SALPINGO OOPHORECTOMY;  Surgeon: [name], MD;  Location: [facility];  Service: Gynecology;  Laterality: Right;  laparoscopic right salpingo-oophorectomy  . PARTIAL HYSTERECTOMY    . TONSILECTOMY, ADENOIDECTOMY, BILATERAL MYRINGOTOMY AND TUBES    . VESICOVAGINAL FISTULA CLOSURE W/ TAH     No Known Allergies No current facility-administered medications on file prior to encounter.   Current Outpatient Medications  on File Prior to Encounter  Medication Sig Dispense Refill  . acyclovir (ZOVIRAX) 800 MG tablet TAKE (1) TABLET BY MOUTH TWICE DAILY. 60 tablet 3  . allopurinol (ZYLOPRIM) 300 MG tablet TAKE 1 TABLET DAILY 90 tablet 3  . aspirin EC 81 MG tablet Take 81 mg by mouth daily.    . Bortezomib (VELCADE IJ) Inject as directed every 14 (fourteen) days.     . bumetanide (BUMEX) 0.5 MG tablet Take 0.5 mg by mouth daily.    . carvedilol (COREG) 25 MG tablet Take 25 mg by mouth 2 (two) times daily.    . diazepam (VALIUM) 5 MG tablet TAKE 1 TABLET BY MOUTH AT BEDTIME 30 tablet 5  . docusate sodium (COLACE) 100 MG capsule Take 100 mg by mouth daily.     . fluconazole (DIFLUCAN) 200 MG tablet Take 200 mg by mouth daily.    Marland Kitchen lenalidomide (REVLIMID) 10 MG capsule TAKE 1 CAPSULE BY MOUTH  DAILY FOR 21 DAYS ON, THEN  7 DAYS OFF 21 capsule 1  . losartan (COZAAR) 25 MG tablet Take 25 mg by mouth daily.     . magnesium oxide (MAG-OX) 400 (241.3 Mg) MG tablet Take 1 tablet (400 mg total) by mouth 3 (three) times daily. 90 tablet 3  . metFORMIN (GLUCOPHAGE) 500 MG tablet TAKE 1 TABLET BY MOUTH ONCE DAILY WITH BREAKFAST (Patient taking differently: Take 1,000 mg by mouth 2 (two) times  daily with a meal. ) 90 tablet 0  . omeprazole (PRILOSEC) 20 MG capsule TAKE 1 CAPSULE DAILY 90 capsule 3  . ondansetron (ZOFRAN) 8 MG tablet Take 1 tablet (8 mg total) by mouth every 8 (eight) hours as needed. 30 tablet 3  . potassium chloride SA (KLOR-CON) 20 MEQ tablet TAKE (1) TABLET BY MOUTH TWICE DAILY. 60 tablet 6  . pregabalin (LYRICA) 200 MG capsule TAKE (1) CAPSULE BY MOUTH TWICE DAILY. 60 capsule 4  . rosuvastatin (CRESTOR) 5 MG tablet Take 5 mg by mouth daily.     . TRULICITY 5.00 XF/8.1WE SOPN Inject 0.75 mg into the skin every Monday.     Marland Kitchen albuterol (PROVENTIL HFA;VENTOLIN HFA) 108 (90 Base) MCG/ACT inhaler Inhale 2 puffs into the lungs every 4 (four) hours as needed for wheezing or shortness of breath. 1 Inhaler 3  .  benzonatate (TESSALON) 100 MG capsule Take 1 capsule (100 mg total) by mouth every 8 (eight) hours. 21 capsule 0  . HYDROcodone-acetaminophen (NORCO/VICODIN) 5-325 MG tablet Take 1 tablet by mouth at bedtime as needed for moderate pain. 30 tablet 0  . prochlorperazine (COMPAZINE) 10 MG tablet Take 1 tablet (10 mg total) by mouth every 6 (six) hours as needed for nausea or vomiting. 30 tablet 2   Social History   Socioeconomic History  . Marital status: Married    Spouse name: Not on file  . Number of children: Not on file  . Years of education: 105  . Highest education level: Not on file  Occupational History    Employer: [employer]  Tobacco Use  . Smoking status: Former Smoker    Quit date: 02/27/1999    Years since quitting: 20.5  . Smokeless tobacco: Never Used  . Tobacco comment: socially   Substance and Sexual Activity  . Alcohol use: No    Alcohol/week: 0.0 standard drinks  . Drug use: No  . Sexual activity: Yes  Other Topics Concern  . Not on file  Social History Narrative  . Not on file   Social Determinants of Health   Financial Resource Strain:   . Difficulty of Paying Living Expenses:   Food Insecurity:   . Worried About Charity fundraiser in the Last Year:   . Arboriculturist in the Last Year:   Transportation Needs:   . Film/video editor (Medical):   Marland Kitchen Lack of Transportation (Non-Medical):   Physical Activity:   . Days of Exercise per Week:   . Minutes of Exercise per Session:   Stress:   . Feeling of Stress :   Social Connections:   . Frequency of Communication with Friends and Family:   . Frequency of Social Gatherings with Friends and Family:   . Attends Religious Services:   . Active Member of Clubs or Organizations:   . Attends Archivist Meetings:   Marland Kitchen Marital Status:   Intimate Partner Violence:   . Fear of Current or Ex-Partner:   . Emotionally Abused:   Marland Kitchen Physically Abused:   . Sexually Abused:    Family History  Problem  Relation Age of Onset  . Arthritis Other   . Cancer Other   . Diabetes Other   . Hypertension Mother   . Dementia Mother   . Diabetes Father   . ALS Father   . Diabetes Brother   . Hypertension Brother   . Cancer Paternal Aunt   . COPD Maternal Grandmother   . Cancer Maternal Grandfather   .  Anesthesia problems Paternal Grandfather     OBJECTIVE:  Vitals:   09/17/19 1012  BP: 121/81  Pulse: 82  Resp: 18  Temp: 99.5 F (37.5 C)  TempSrc: Oral  SpO2: 94%  Weight: 243 lb (110.2 kg)  Height: 5' 4"  (1.626 m)    General appearance: alert; appears fatigued, but nontoxic; speaking in full sentences and tolerating own secretions HEENT: NCAT; Ears: EACs clear, TMs pearly gray; Eyes: PERRL.  EOM grossly intact. Nose: nares patent with clear rhinorrhea, Throat: oropharynx clear, tonsils non erythematous or enlarged, uvula midline  Neck: supple without LAD Lungs: unlabored respirations, symmetrical air entry; cough: absent; no respiratory distress; CTAB Heart: regular rate and rhythm.   Skin: warm and dry Psychological: alert and cooperative; normal mood and affect  ASSESSMENT & PLAN:  1. Suspected COVID-19 virus infection   2. Exposure to COVID-19 virus   3. Viral URI     Meds ordered this encounter  Medications  . cetirizine (ZYRTEC) 10 MG tablet    Sig: Take 1 tablet (10 mg total) by mouth daily.    Dispense:  30 tablet    Refill:  0    Order Specific Question:   Supervising Provider    Answer:   Raylene Everts [[id number]]  . fluticasone (FLONASE) 50 MCG/ACT nasal spray    Sig: Place 2 sprays into both nostrils daily.    Dispense:  16 g    Refill:  0    Order Specific Question:   Supervising Provider    Answer:   Raylene Everts [[id number]]    COVID testing ordered.  It will take between 2-5 days for test results.  Someone will contact you regarding abnormal results.    In the meantime: You should remain isolated in your home for 10 days from symptom onset AND  greater than 72 hours after symptoms resolution (absence of fever without the use of fever-reducing medication and improvement in respiratory symptoms), whichever is longer Get plenty of rest and push fluids zyrtec for nasal congestion, runny nose, and/or sore throat flonase for nasal congestion and runny nose Use medications daily for symptom relief Use OTC medications like ibuprofen or tylenol as needed fever or pain Call or go to the ED if you have any new or worsening symptoms such as fever, cough, shortness of breath, chest tightness, chest pain, turning blue, changes in mental status, etc...   Reviewed expectations re: course of current medical issues. Questions answered. Outlined signs and symptoms indicating need for more acute intervention. Patient verbalized understanding. After Visit Summary given.         Lestine Box, PA-C 09/17/19 1033

## 2019-09-17 NOTE — Discharge Instructions (Signed)
COVID testing ordered.  It will take between 2-5 days for test results.  Someone will contact you regarding abnormal results.    In the meantime: You should remain isolated in your home for 10 days from symptom onset AND greater than 72 hours after symptoms resolution (absence of fever without the use of fever-reducing medication and improvement in respiratory symptoms), whichever is longer Get plenty of rest and push fluids zyrtec for nasal congestion, runny nose, and/or sore throat flonase for nasal congestion and runny nose Use medications daily for symptom relief Use OTC medications like ibuprofen or tylenol as needed fever or pain Call or go to the ED if you have any new or worsening symptoms such as fever, cough, shortness of breath, chest tightness, chest pain, turning blue, changes in mental status, etc...  

## 2019-09-18 LAB — NOVEL CORONAVIRUS, NAA: SARS-CoV-2, NAA: DETECTED — AB

## 2019-09-19 ENCOUNTER — Encounter (HOSPITAL_COMMUNITY): Payer: Self-pay | Admitting: Emergency Medicine

## 2019-09-19 ENCOUNTER — Emergency Department (HOSPITAL_COMMUNITY)
Admission: EM | Admit: 2019-09-19 | Discharge: 2019-09-19 | Disposition: A | Discharge status: Home / Self Care | Payer: BC Managed Care – PPO | Attending: Emergency Medicine | Admitting: Emergency Medicine

## 2019-09-19 ENCOUNTER — Other Ambulatory Visit: Payer: Self-pay

## 2019-09-19 ENCOUNTER — Other Ambulatory Visit: Payer: Self-pay | Admitting: Adult Health

## 2019-09-19 DIAGNOSIS — Z79899 Other long term (current) drug therapy: Secondary | ICD-10-CM | POA: Insufficient documentation

## 2019-09-19 DIAGNOSIS — Z87891 Personal history of nicotine dependence: Secondary | ICD-10-CM | POA: Insufficient documentation

## 2019-09-19 DIAGNOSIS — Z8579 Personal history of other malignant neoplasms of lymphoid, hematopoietic and related tissues: Secondary | ICD-10-CM | POA: Diagnosis not present

## 2019-09-19 DIAGNOSIS — U071 COVID-19: Secondary | ICD-10-CM | POA: Diagnosis not present

## 2019-09-19 DIAGNOSIS — R509 Fever, unspecified: Secondary | ICD-10-CM | POA: Diagnosis present

## 2019-09-19 DIAGNOSIS — I10 Essential (primary) hypertension: Secondary | ICD-10-CM | POA: Diagnosis not present

## 2019-09-19 DIAGNOSIS — Z7984 Long term (current) use of oral hypoglycemic drugs: Secondary | ICD-10-CM | POA: Diagnosis not present

## 2019-09-19 DIAGNOSIS — E119 Type 2 diabetes mellitus without complications: Secondary | ICD-10-CM | POA: Diagnosis not present

## 2019-09-19 DIAGNOSIS — R531 Weakness: Secondary | ICD-10-CM

## 2019-09-19 LAB — CBC WITH DIFFERENTIAL/PLATELET
Abs Immature Granulocytes: 0.01 10*3/uL (ref 0.00–0.07)
Basophils Absolute: 0 10*3/uL (ref 0.0–0.1)
Basophils Relative: 0 %
Eosinophils Absolute: 0 10*3/uL (ref 0.0–0.5)
Eosinophils Relative: 0 %
HCT: 30.8 % — ABNORMAL LOW (ref 36.0–46.0)
Hemoglobin: 9.9 g/dL — ABNORMAL LOW (ref 12.0–15.0)
Immature Granulocytes: 0 %
Lymphocytes Relative: 31 %
Lymphs Abs: 0.8 10*3/uL (ref 0.7–4.0)
MCH: 30.2 pg (ref 26.0–34.0)
MCHC: 32.1 g/dL (ref 30.0–36.0)
MCV: 93.9 fL (ref 80.0–100.0)
Monocytes Absolute: 0.4 10*3/uL (ref 0.1–1.0)
Monocytes Relative: 16 %
Neutro Abs: 1.4 10*3/uL — ABNORMAL LOW (ref 1.7–7.7)
Neutrophils Relative %: 53 %
Platelets: 90 10*3/uL — ABNORMAL LOW (ref 150–400)
RBC: 3.28 MIL/uL — ABNORMAL LOW (ref 3.87–5.11)
RDW: 15.2 % (ref 11.5–15.5)
WBC: 2.6 10*3/uL — ABNORMAL LOW (ref 4.0–10.5)
nRBC: 0 % (ref 0.0–0.2)

## 2019-09-19 LAB — BASIC METABOLIC PANEL
Anion gap: 10 (ref 5–15)
BUN: 19 mg/dL (ref 6–20)
CO2: 21 mmol/L — ABNORMAL LOW (ref 22–32)
Calcium: 8.7 mg/dL — ABNORMAL LOW (ref 8.9–10.3)
Chloride: 106 mmol/L (ref 98–111)
Creatinine, Ser: 1.27 mg/dL — ABNORMAL HIGH (ref 0.44–1.00)
GFR calc Af Amer: 55 mL/min — ABNORMAL LOW (ref >60)
GFR calc non Af Amer: 47 mL/min — ABNORMAL LOW (ref >60)
Glucose, Bld: 109 mg/dL — ABNORMAL HIGH (ref 70–99)
Potassium: 3.5 mmol/L (ref 3.5–5.1)
Sodium: 137 mmol/L (ref 135–145)

## 2019-09-19 MED ORDER — SODIUM CHLORIDE 0.9 % IV SOLN
700.0000 mg | Freq: Once | INTRAVENOUS | Status: AC
  Administered 2019-09-20: 700 mg via INTRAVENOUS
  Filled 2019-09-19: qty 700

## 2019-09-19 NOTE — ED Provider Notes (Signed)
[facility] Provider Note   CSN: [id number] Arrival date & time: 09/19/19  1057     History Chief Complaint  Patient presents with  . Weakness    TERESITA FANTON is a 54 y.o. female with a history of diabetes, high blood pressure and who is currently active bleed receiving immunosuppressive chemotherapy for multiple myeloma presenting for evaluation of fever in association with a positive COVID-19 test.  She was diagnosed 2 days ago at our local urgent care center.  She has symptoms including generalized weakness, reduced sense of smell and taste.  She denies shortness of breath, chest pain, has no nausea, vomiting or diarrhea.  She had a fever of 102 three days ago which was treated with Tylenol.  Since then she has had intermittent low-grade fevers to 100.  She contacted the cancer center and they recommended she come in to make sure she is not neutropenic.  Her last chemo infusion was on 3/3, gets infused every 14 days.  She has been contacted by the [facility] clinic and will be receiving her first monoclonal antibody infusion tomorrow given her Covid positive status.  The history is provided by the patient.       Past Medical History:  Diagnosis Date  . Acid reflux   . Allergic rhinitis   . Cancer ([place])    multiple myeloma  . Diabetes mellitus    type 2  . Gout   . Gout   . HBP (high blood pressure)   . History of kidney stones   . Migraines     Patient Active Problem List   Diagnosis Date Noted  . Acute bronchiolitis due to respiratory syncytial virus (RSV) 08/11/2018  . Hypomagnesemia 08/10/2018  . Hyperlipidemia 08/09/2018  . Anemia 08/09/2018  . Leukopenia 08/09/2018  . Hypokalemia 08/09/2018  . Hypocalcemia 08/09/2018  . Lactic acidosis 08/09/2018  . Sepsis ([place]) 04/25/2018  . Goals of care, counseling/discussion 11/01/2017  . Multiple myeloma ([place]) 10/29/2017  . Hypercalcemia 10/29/2017  . Type 2 diabetes mellitus without complication,  without long-term current use of insulin ([place]) 01/27/2016  . Morbid obesity due to excess calories ([place]) 01/27/2016  . Anxiety as acute reaction to exceptional stress 01/13/2015  . Attention deficit hyperactivity disorder (ADHD), 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  . Gout 01/20/2014  . GERD (gastroesophageal reflux disease) 01/20/2014  . Essential hypertension, benign 01/20/2014  . Plantar fasciitis of left foot 09/19/2011    Past Surgical History:  Procedure Laterality Date  . CESAREAN SECTION    . EXTRACORPOREAL SHOCK WAVE LITHOTRIPSY Left 10/10/2017   Procedure: LEFT EXTRACORPOREAL SHOCK WAVE LITHOTRIPSY (ESWL);  Surgeon: Bjorn Loser, MD;  Location: [facility];  Service: Urology;  Laterality: Left;  . EYE SURGERY    . HEMORRHOID SURGERY N/A 11/19/2012   Procedure: HEMORRHOIDECTOMY;  Surgeon: Jamesetta So, MD;  Location: [facility];  Service: General;  Laterality: N/A;  . kidney stones  1998  . LAPAROSCOPIC UNILATERAL SALPINGO OOPHERECTOMY  05/14/2012   Procedure: LAPAROSCOPIC UNILATERAL SALPINGO OOPHORECTOMY;  Surgeon: [name], MD;  Location: [facility];  Service: Gynecology;  Laterality: Right;  laparoscopic right salpingo-oophorectomy  . PARTIAL HYSTERECTOMY    . TONSILECTOMY, ADENOIDECTOMY, BILATERAL MYRINGOTOMY AND TUBES    . VESICOVAGINAL FISTULA CLOSURE W/ TAH       OB History   No obstetric history on file.     Family History  Problem Relation Age of Onset  . Arthritis Other   . Cancer Other   . Diabetes  Other   . Hypertension Mother   . Dementia Mother   . Diabetes Father   . ALS Father   . Diabetes Brother   . Hypertension Brother   . Cancer Paternal Aunt   . COPD Maternal Grandmother   . Cancer Maternal Grandfather   . Anesthesia problems Paternal Grandfather     Social History   Tobacco Use  . Smoking status: Former Smoker    Quit date: 02/27/1999    Years since quitting: 20.5  . Smokeless tobacco: Never Used  . Tobacco comment: socially   Substance Use Topics   . Alcohol use: No    Alcohol/week: 0.0 standard drinks  . Drug use: No    Home Medications Prior to Admission medications   Medication Sig Start Date End Date Taking? Authorizing Provider  acyclovir (ZOVIRAX) 800 MG tablet TAKE (1) TABLET BY MOUTH TWICE DAILY. 09/07/19   Derek Jack, MD  albuterol (PROVENTIL HFA;VENTOLIN HFA) 108 (90 Base) MCG/ACT inhaler Inhale 2 puffs into the lungs every 4 (four) hours as needed for wheezing or shortness of breath. 08/11/18   Murlean Iba, MD  allopurinol (ZYLOPRIM) 300 MG tablet TAKE 1 TABLET DAILY 07/07/19   Mikey Kirschner, MD  aspirin EC 81 MG tablet Take 81 mg by mouth daily.    [provider]  benzonatate (TESSALON) 100 MG capsule Take 1 capsule (100 mg total) by mouth every 8 (eight) hours. 09/09/19   Avegno, Darrelyn Hillock, FNP  Bortezomib (VELCADE IJ) Inject as directed every 14 (fourteen) days.     [provider]  bumetanide (BUMEX) 0.5 MG tablet Take 0.5 mg by mouth daily. 06/30/19   [provider]  carvedilol (COREG) 25 MG tablet Take 25 mg by mouth 2 (two) times daily. 05/05/19   [provider]  cetirizine (ZYRTEC) 10 MG tablet Take 1 tablet (10 mg total) by mouth daily. 09/17/19   Wurst, [place], PA-C  diazepam (VALIUM) 5 MG tablet TAKE 1 TABLET BY MOUTH AT BEDTIME 04/01/19   Mikey Kirschner, MD  docusate sodium (COLACE) 100 MG capsule Take 100 mg by mouth daily.     [provider]  fluconazole (DIFLUCAN) 200 MG tablet Take 200 mg by mouth daily. 08/29/19   [provider]  fluticasone (FLONASE) 50 MCG/ACT nasal spray Place 2 sprays into both nostrils daily. 09/17/19   Wurst, [place], PA-C  HYDROcodone-acetaminophen (NORCO/VICODIN) 5-325 MG tablet Take 1 tablet by mouth at bedtime as needed for moderate pain. 10/16/18   Derek Jack, MD  losartan (COZAAR) 25 MG tablet Take 25 mg by mouth daily.  04/22/19   [provider]  magnesium oxide (MAG-OX) 400 (241.3  Mg) MG tablet Take 1 tablet (400 mg total) by mouth 3 (three) times daily. 10/02/18   Derek Jack, MD  metFORMIN (GLUCOPHAGE) 500 MG tablet TAKE 1 TABLET BY MOUTH ONCE DAILY WITH BREAKFAST Patient taking differently: Take 1,000 mg by mouth 2 (two) times daily with a meal.  07/21/18   Mikey Kirschner, MD  omeprazole (PRILOSEC) 20 MG capsule TAKE 1 CAPSULE DAILY 07/07/19   Mikey Kirschner, MD  ondansetron (ZOFRAN) 8 MG tablet Take 1 tablet (8 mg total) by mouth every 8 (eight) hours as needed. 04/16/19   Derek Jack, MD  potassium chloride SA (KLOR-CON) 20 MEQ tablet TAKE (1) TABLET BY MOUTH TWICE DAILY. 06/15/19   Mikey Kirschner, MD  pregabalin (LYRICA) 200 MG capsule TAKE (1) CAPSULE BY MOUTH TWICE DAILY. 08/05/19   Delton Coombes,  Dirk Dress, MD  prochlorperazine (COMPAZINE) 10 MG tablet Take 1 tablet (10 mg total) by mouth every 6 (six) hours as needed for nausea or vomiting. 04/16/19   Derek Jack, MD  rosuvastatin (CRESTOR) 5 MG tablet Take 5 mg by mouth daily.  05/14/18   [provider]  TRULICITY 3.53 GD/9.2EQ SOPN Inject 0.75 mg into the skin every Monday.  05/14/18   [provider]    Allergies    Patient has no known allergies.  Review of Systems   Review of Systems  Constitutional: Positive for appetite change, fatigue and fever.       No appetite with loss of taste/smell but has been maintaining hydration and making herself eat.  No n/v.  HENT: Negative for congestion and sore throat.   Eyes: Negative.   Respiratory: Negative for chest tightness and shortness of breath.   Cardiovascular: Negative for chest pain and palpitations.  Gastrointestinal: Negative for abdominal pain, diarrhea, nausea and vomiting.  Genitourinary: Negative.   Musculoskeletal: Negative for arthralgias, joint swelling and neck pain.  Skin: Negative.  Negative for rash and wound.  Neurological: Negative for dizziness, weakness, light-headedness, numbness and  headaches.  Hematological: Negative for adenopathy. Does not bruise/bleed easily.  Psychiatric/Behavioral: Negative.     Physical Exam Updated Vital Signs BP 117/86   Pulse 65   Temp 97.7 F (36.5 C) (Oral)   Resp 15   Ht 5' 4"  (1.626 m)   Wt 107.5 kg   SpO2 98%   BMI 40.68 kg/m   Physical Exam Vitals and nursing note reviewed.  Constitutional:      Appearance: She is well-developed.  HENT:     Head: Normocephalic and atraumatic.  Eyes:     Conjunctiva/sclera: Conjunctivae normal.  Cardiovascular:     Rate and Rhythm: Normal rate and regular rhythm.     Heart sounds: Normal heart sounds.  Pulmonary:     Effort: Pulmonary effort is normal. No respiratory distress.     Breath sounds: Normal breath sounds. No wheezing or rhonchi.  Abdominal:     General: Bowel sounds are normal. There is no distension.     Palpations: Abdomen is soft.     Tenderness: There is no abdominal tenderness.  Musculoskeletal:        General: Normal range of motion.     Cervical back: Normal range of motion.  Skin:    General: Skin is warm and dry.  Neurological:     General: No focal deficit present.     Mental Status: She is alert and oriented to person, place, and time.  Psychiatric:        Mood and Affect: Mood normal.     ED Results / Procedures / Treatments   Labs (all labs ordered are listed, but only abnormal results are displayed) Labs Reviewed  CBC WITH DIFFERENTIAL/PLATELET - Abnormal; Notable for the following components:      Result Value   WBC 2.6 (*)    RBC 3.28 (*)    Hemoglobin 9.9 (*)    HCT 30.8 (*)    Platelets 90 (*)    Neutro Abs 1.4 (*)    All other components within normal limits  BASIC METABOLIC PANEL - Abnormal; Notable for the following components:   CO2 21 (*)    Glucose, Bld 109 (*)    Creatinine, Ser 1.27 (*)    Calcium 8.7 (*)    GFR calc non Af Amer 47 (*)    GFR calc Af  Amer 55 (*)    All other components within normal limits     EKG None  Radiology No results found.  Procedures Procedures (including critical care time)  Medications Ordered in ED Medications - No data to display  ED Course  I have reviewed the triage vital signs and the nursing notes.  Pertinent labs & imaging results that were available during my care of the patient were reviewed by me and considered in my medical decision making (see chart for details).    MDM Rules/Calculators/A&P                      Immunosuppressed pt with active chemotherapy for multiple myeloma.  Tested positive for Covid 2 days ago, sx starting the day before. Fevers and neutropenia but stable.  Labs reviewed and discussed with Dr. Delton Coombes who recommends holding her Revlimid until infection has cleared and to reschedule her next velcade injection for 2 weeks or when better from Covid.  He concurs with her planned monclonal ab infusion tomorrow.  Platelet count low today, suspected from her Covid dx, not from her chemo.  No bleeding/bruising per hx. Other home instructions discussed along with return precautions including sob, increased weakness or new concerns.   SRAVYA GRISSOM was evaluated in Emergency Department on 09/19/2019 for the symptoms described in the history of present illness. She was evaluated in the context of the global COVID-19 pandemic, which necessitated consideration that the patient might be at risk for infection with the SARS-CoV-2 virus that causes COVID-19. Institutional protocols and algorithms that pertain to the evaluation of patients at risk for COVID-19 are in a state of rapid change based on information released by regulatory bodies including the CDC and federal and state organizations. These policies and algorithms were followed during the patient's care in the ED.  Final Clinical Impression(s) / ED Diagnoses Final diagnoses:  Weakness  COVID-19    Rx / DC Orders ED Discharge Orders    None       Landis Martins 09/19/19 1426    Veryl Speak, MD 09/20/19 [phone]

## 2019-09-19 NOTE — Discharge Instructions (Addendum)
Dr. Delton Coombes has advised that you hold your Revlimid until you are completely over your Covid infection.  Also he wants you to skip this weeks infusion treatment, instead scheduling your next dose also when this infection has resolved.  He does agree with you obtaining the monoclonal antibody infusion tomorrow as has been scheduled for you.  Rest to make sure you are drinking plenty of fluids.  Continue to treat your fever with Tylenol as needed.  Return here for recheck if you develop any worsening symptoms including shortness of breath, chest pain or increasing weakness.  As you know, you will need to maintain home isolation and quarantine for 10 days from the date of your diagnosis, except for tomorrow as infusion treatment.

## 2019-09-19 NOTE — ED Notes (Signed)
Pt verbalized low grade fevers and tylenol helpful.

## 2019-09-19 NOTE — Progress Notes (Signed)
  I connected by phone with Joanna Reid on 09/19/2019 at 8:32 AM to discuss the potential use of an new treatment for mild to moderate COVID-19 viral infection in non-hospitalized patients.  This patient is a 54 y.o. female that meets the FDA criteria for Emergency Use Authorization of bamlanivimab or casirivimab\imdevimab.  Has a (+) direct SARS-CoV-2 viral test result  Has mild or moderate COVID-19   Is ? 55 years of age and weighs ? 40 kg  Is NOT hospitalized due to COVID-19  Is NOT requiring oxygen therapy or requiring an increase in baseline oxygen flow rate due to COVID-19  Is within 10 days of symptom onset  Has at least one of the high risk factor(s) for progression to severe COVID-19 and/or hospitalization as defined in EUA.  Specific high risk criteria : Diabetes   I have spoken and communicated the following to the patient or parent/caregiver:  1. FDA has authorized the emergency use of bamlanivimab and casirivimab\imdevimab for the treatment of mild to moderate COVID-19 in adults and pediatric patients with positive results of direct SARS-CoV-2 viral testing who are 53 years of age and older weighing at least 40 kg, and who are at high risk for progressing to severe COVID-19 and/or hospitalization.  2. The significant known and potential risks and benefits of bamlanivimab and casirivimab\imdevimab, and the extent to which such potential risks and benefits are unknown.  3. Information on available alternative treatments and the risks and benefits of those alternatives, including clinical trials.  4. Patients treated with bamlanivimab and casirivimab\imdevimab should continue to self-isolate and use infection control measures (e.g., wear mask, isolate, social distance, avoid sharing personal items, clean and disinfect "high touch" surfaces, and frequent handwashing) according to CDC guidelines.   5. The patient or parent/caregiver has the option to accept or refuse  bamlanivimab or casirivimab\imdevimab .  After reviewing this information with the patient, The patient agreed to proceed with receiving the bamlanimivab infusion and will be provided a copy of the Fact sheet prior to receiving the infusion..  Scheduled for 09/20/19 at [place] Joanna Reid 09/19/2019 8:32 AM

## 2019-09-19 NOTE — ED Triage Notes (Signed)
Pt reports she tested positive for covid on Thursday. Was advised by cancer triage nurse to come be checked for neutropenia. Next chemo next week and outpatient infusion tomorrow.

## 2019-09-20 ENCOUNTER — Ambulatory Visit (HOSPITAL_COMMUNITY)
Admission: RE | Admit: 2019-09-20 | Discharge: 2019-09-20 | Disposition: A | Discharge status: Home / Self Care | Payer: BC Managed Care – PPO | Source: Ambulatory Visit | Attending: Pulmonary Disease | Admitting: Pulmonary Disease

## 2019-09-20 DIAGNOSIS — E118 Type 2 diabetes mellitus with unspecified complications: Secondary | ICD-10-CM | POA: Insufficient documentation

## 2019-09-20 DIAGNOSIS — U071 COVID-19: Secondary | ICD-10-CM | POA: Insufficient documentation

## 2019-09-20 MED ORDER — ALBUTEROL SULFATE HFA 108 (90 BASE) MCG/ACT IN AERS: 2.0000 | INHALATION_SPRAY | Freq: Once | RESPIRATORY_TRACT | Status: DC | PRN

## 2019-09-20 MED ORDER — FAMOTIDINE IN NACL 20-0.9 MG/50ML-% IV SOLN: 20.0000 mg | Freq: Once | INTRAVENOUS | Status: DC | PRN

## 2019-09-20 MED ORDER — METHYLPREDNISOLONE SODIUM SUCC 125 MG IJ SOLR: 125.0000 mg | Freq: Once | INTRAMUSCULAR | Status: DC | PRN

## 2019-09-20 MED ORDER — DIPHENHYDRAMINE HCL 50 MG/ML IJ SOLN: 50.0000 mg | Freq: Once | INTRAMUSCULAR | Status: DC | PRN

## 2019-09-20 MED ORDER — SODIUM CHLORIDE 0.9 % IV SOLN
INTRAVENOUS | Status: DC | PRN
  Administered 2019-09-20: 250 mL via INTRAVENOUS

## 2019-09-20 MED ORDER — EPINEPHRINE 0.3 MG/0.3ML IJ SOAJ: 0.3000 mg | Freq: Once | INTRAMUSCULAR | Status: DC | PRN

## 2019-09-20 NOTE — Discharge Instructions (Signed)

## 2019-09-20 NOTE — Progress Notes (Signed)
  Diagnosis: COVID-19  Physician: Dr. Joya Gaskins  Procedure: Covid Infusion Clinic Med: bamlanivimab infusion - Provided patient with bamlanimivab fact sheet for patients, parents and caregivers prior to infusion.  Complications: No immediate complications noted.  Discharge: Discharged home   Acquanetta Chain 09/20/2019

## 2019-09-23 ENCOUNTER — Ambulatory Visit (HOSPITAL_COMMUNITY): Payer: BC Managed Care – PPO

## 2019-09-23 ENCOUNTER — Inpatient Hospital Stay (HOSPITAL_COMMUNITY): Payer: BC Managed Care – PPO

## 2019-09-24 NOTE — Progress Notes (Signed)

## 2019-09-28 ENCOUNTER — Other Ambulatory Visit: Payer: Self-pay

## 2019-09-28 ENCOUNTER — Encounter (HOSPITAL_COMMUNITY): Payer: Self-pay

## 2019-09-28 ENCOUNTER — Inpatient Hospital Stay (HOSPITAL_COMMUNITY): Payer: BC Managed Care – PPO

## 2019-09-28 DIAGNOSIS — Z5112 Encounter for antineoplastic immunotherapy: Secondary | ICD-10-CM | POA: Diagnosis not present

## 2019-09-28 DIAGNOSIS — C9000 Multiple myeloma not having achieved remission: Secondary | ICD-10-CM

## 2019-09-28 LAB — COMPREHENSIVE METABOLIC PANEL
ALT: 12 U/L (ref 0–44)
AST: 18 U/L (ref 15–41)
Albumin: 3.7 g/dL (ref 3.5–5.0)
Alkaline Phosphatase: 61 U/L (ref 38–126)
Anion gap: 10 (ref 5–15)
BUN: 28 mg/dL — ABNORMAL HIGH (ref 6–20)
CO2: 25 mmol/L (ref 22–32)
Calcium: 9.5 mg/dL (ref 8.9–10.3)
Chloride: 109 mmol/L (ref 98–111)
Creatinine, Ser: 1.01 mg/dL — ABNORMAL HIGH (ref 0.44–1.00)
GFR calc Af Amer: >60 mL/min (ref >60)
GFR calc non Af Amer: >60 mL/min (ref >60)
Glucose, Bld: 113 mg/dL — ABNORMAL HIGH (ref 70–99)
Potassium: 3.7 mmol/L (ref 3.5–5.1)
Sodium: 144 mmol/L (ref 135–145)
Total Bilirubin: 0.3 mg/dL (ref 0.3–1.2)
Total Protein: 7.5 g/dL (ref 6.5–8.1)

## 2019-09-28 LAB — MAGNESIUM: Magnesium: 1.3 mg/dL — ABNORMAL LOW (ref 1.7–2.4)

## 2019-09-28 LAB — CBC WITH DIFFERENTIAL/PLATELET
Abs Immature Granulocytes: 0 10*3/uL (ref 0.00–0.07)
Basophils Absolute: 0 10*3/uL (ref 0.0–0.1)
Basophils Relative: 1 %
Eosinophils Absolute: 0 10*3/uL (ref 0.0–0.5)
Eosinophils Relative: 1 %
HCT: 31.1 % — ABNORMAL LOW (ref 36.0–46.0)
Hemoglobin: 10 g/dL — ABNORMAL LOW (ref 12.0–15.0)
Immature Granulocytes: 0 %
Lymphocytes Relative: 41 %
Lymphs Abs: 0.9 10*3/uL (ref 0.7–4.0)
MCH: 30.1 pg (ref 26.0–34.0)
MCHC: 32.2 g/dL (ref 30.0–36.0)
MCV: 93.7 fL (ref 80.0–100.0)
Monocytes Absolute: 0.2 10*3/uL (ref 0.1–1.0)
Monocytes Relative: 10 %
Neutro Abs: 1 10*3/uL — ABNORMAL LOW (ref 1.7–7.7)
Neutrophils Relative %: 47 %
Platelets: 214 10*3/uL (ref 150–400)
RBC: 3.32 MIL/uL — ABNORMAL LOW (ref 3.87–5.11)
RDW: 15.7 % — ABNORMAL HIGH (ref 11.5–15.5)
WBC: 2.1 10*3/uL — ABNORMAL LOW (ref 4.0–10.5)
nRBC: 0 % (ref 0.0–0.2)

## 2019-09-28 LAB — LACTATE DEHYDROGENASE: LDH: 129 U/L (ref 98–192)

## 2019-09-28 NOTE — Progress Notes (Signed)
[place] reviewed with Dr. Delton Coombes and Velcade injection to be held today as well as her PO Revlimid due to testing positive for CO-vid on 09/17/19. Pt to return on 10/12/19 for her next appt.and can resume her Revlimid at that time as well. Pt's magnesium level is 1.3 and Dr. Delton Coombes ordered for her to increase her Mag-Ox to TID instead of BID. Pt verbalized understanding of this information and was discharged in satisfactory condition

## 2019-10-06 NOTE — Progress Notes (Signed)
.  Pharmacist Chemotherapy Monitoring - Follow Up Assessment    I verify that I have reviewed each item in the below checklist:  . Regimen for the patient is scheduled for the appropriate day and plan matches scheduled date. Marland Kitchen Appropriate non-routine labs are ordered dependent on drug ordered. . If applicable, additional medications reviewed and ordered per protocol based on lifetime cumulative doses and/or treatment regimen.   Plan for follow-up and/or issues identified: No . I-vent associated with next due treatment: No . MD and/or nursing notified: No  Joanna Reid 10/06/2019 3:26 PM

## 2019-10-07 ENCOUNTER — Ambulatory Visit (HOSPITAL_COMMUNITY): Payer: BC Managed Care – PPO

## 2019-10-07 ENCOUNTER — Other Ambulatory Visit (HOSPITAL_COMMUNITY): Payer: BC Managed Care – PPO

## 2019-10-08 ENCOUNTER — Other Ambulatory Visit: Payer: Self-pay | Admitting: Family Medicine

## 2019-10-08 NOTE — Telephone Encounter (Signed)
Ok pus five ref

## 2019-10-12 ENCOUNTER — Inpatient Hospital Stay (HOSPITAL_COMMUNITY): Payer: BC Managed Care – PPO

## 2019-10-12 ENCOUNTER — Inpatient Hospital Stay (HOSPITAL_COMMUNITY): Payer: BC Managed Care – PPO | Attending: Hematology

## 2019-10-12 ENCOUNTER — Other Ambulatory Visit: Payer: Self-pay

## 2019-10-12 VITALS — BP 151/86 | HR 70 | Temp 96.7°F | Resp 18 | Wt 244.0 lb

## 2019-10-12 DIAGNOSIS — Z5112 Encounter for antineoplastic immunotherapy: Secondary | ICD-10-CM | POA: Diagnosis present

## 2019-10-12 DIAGNOSIS — C9000 Multiple myeloma not having achieved remission: Secondary | ICD-10-CM

## 2019-10-12 DIAGNOSIS — C9001 Multiple myeloma in remission: Secondary | ICD-10-CM

## 2019-10-12 LAB — COMPREHENSIVE METABOLIC PANEL
ALT: 12 U/L (ref 0–44)
AST: 16 U/L (ref 15–41)
Albumin: 3.9 g/dL (ref 3.5–5.0)
Alkaline Phosphatase: 65 U/L (ref 38–126)
Anion gap: 11 (ref 5–15)
BUN: 24 mg/dL — ABNORMAL HIGH (ref 6–20)
CO2: 24 mmol/L (ref 22–32)
Calcium: 9.6 mg/dL (ref 8.9–10.3)
Chloride: 105 mmol/L (ref 98–111)
Creatinine, Ser: 0.93 mg/dL (ref 0.44–1.00)
GFR calc Af Amer: >60 mL/min (ref >60)
GFR calc non Af Amer: >60 mL/min (ref >60)
Glucose, Bld: 100 mg/dL — ABNORMAL HIGH (ref 70–99)
Potassium: 3.7 mmol/L (ref 3.5–5.1)
Sodium: 140 mmol/L (ref 135–145)
Total Bilirubin: 0.5 mg/dL (ref 0.3–1.2)
Total Protein: 7.3 g/dL (ref 6.5–8.1)

## 2019-10-12 LAB — CBC WITH DIFFERENTIAL/PLATELET
Abs Immature Granulocytes: 0.01 10*3/uL (ref 0.00–0.07)
Basophils Absolute: 0 10*3/uL (ref 0.0–0.1)
Basophils Relative: 1 %
Eosinophils Absolute: 0.1 10*3/uL (ref 0.0–0.5)
Eosinophils Relative: 2 %
HCT: 33.5 % — ABNORMAL LOW (ref 36.0–46.0)
Hemoglobin: 10.7 g/dL — ABNORMAL LOW (ref 12.0–15.0)
Immature Granulocytes: 0 %
Lymphocytes Relative: 33 %
Lymphs Abs: 0.9 10*3/uL (ref 0.7–4.0)
MCH: 30.6 pg (ref 26.0–34.0)
MCHC: 31.9 g/dL (ref 30.0–36.0)
MCV: 95.7 fL (ref 80.0–100.0)
Monocytes Absolute: 0.4 10*3/uL (ref 0.1–1.0)
Monocytes Relative: 13 %
Neutro Abs: 1.4 10*3/uL — ABNORMAL LOW (ref 1.7–7.7)
Neutrophils Relative %: 51 %
Platelets: 153 10*3/uL (ref 150–400)
RBC: 3.5 MIL/uL — ABNORMAL LOW (ref 3.87–5.11)
RDW: 17.2 % — ABNORMAL HIGH (ref 11.5–15.5)
WBC: 2.7 10*3/uL — ABNORMAL LOW (ref 4.0–10.5)
nRBC: 0 % (ref 0.0–0.2)

## 2019-10-12 LAB — LACTATE DEHYDROGENASE: LDH: 121 U/L (ref 98–192)

## 2019-10-12 LAB — MAGNESIUM: Magnesium: 1.3 mg/dL — ABNORMAL LOW (ref 1.7–2.4)

## 2019-10-12 MED ORDER — BORTEZOMIB CHEMO SQ INJECTION 3.5 MG (2.5MG/ML)
1.3000 mg/m2 | Freq: Once | INTRAMUSCULAR | Status: AC
  Administered 2019-10-12: 15:00:00 3 mg via SUBCUTANEOUS
  Filled 2019-10-12: qty 1.2

## 2019-10-12 MED ORDER — SODIUM CHLORIDE 0.9 % IV SOLN: INTRAVENOUS | Status: DC

## 2019-10-12 MED ORDER — PROCHLORPERAZINE MALEATE 10 MG PO TABS: 10.0000 mg | ORAL_TABLET | Freq: Once | ORAL | Status: DC

## 2019-10-12 MED ORDER — ZOLEDRONIC ACID 4 MG/5ML IV CONC
3.0000 mg | Freq: Once | INTRAVENOUS | Status: AC
  Administered 2019-10-12: 14:00:00 3 mg via INTRAVENOUS
  Filled 2019-10-12: qty 3.75

## 2019-10-12 NOTE — Patient Instructions (Signed)
[facility] Discharge Instructions for Patients Receiving Chemotherapy  Today you received the following chemotherapy agents   To help prevent nausea and vomiting after your treatment, we encourage you to take your nausea medication   If you develop nausea and vomiting that is not controlled by your nausea medication, call the clinic.   BELOW ARE SYMPTOMS THAT SHOULD BE REPORTED IMMEDIATELY:  *FEVER GREATER THAN 100.5 F  *CHILLS WITH OR WITHOUT FEVER  NAUSEA AND VOMITING THAT IS NOT CONTROLLED WITH YOUR NAUSEA MEDICATION  *UNUSUAL SHORTNESS OF BREATH  *UNUSUAL BRUISING OR BLEEDING  TENDERNESS IN MOUTH AND THROAT WITH OR WITHOUT PRESENCE OF ULCERS  *URINARY PROBLEMS  *BOWEL PROBLEMS  UNUSUAL RASH Items with * indicate a potential emergency and should be followed up as soon as possible.  Feel free to call the clinic should you have any questions or concerns. The clinic phone number is [phone].  Please show the CHEMO ALERT CARD at check-in to the Emergency Department and triage nurse.   

## 2019-10-12 NOTE — Progress Notes (Signed)
Labs reviewed by Dr. Delton Coombes. Message received to proceed with treatment. Per MD, May infuse Zometa today if it's been at least 4 weeks after her dental procedure. 09-01-19 dental procedures performed. Zometa released.   Velcade given today per MD orders. Zometa 3mg  IV administered.Tolerated infusion without adverse affects. Vital signs stable. No complaints at this time. Discharged from clinic ambulatory. F/U with [facility] as scheduled.

## 2019-10-13 ENCOUNTER — Ambulatory Visit (INDEPENDENT_AMBULATORY_CARE_PROVIDER_SITE_OTHER): Payer: BC Managed Care – PPO | Admitting: Family Medicine

## 2019-10-13 DIAGNOSIS — I10 Essential (primary) hypertension: Secondary | ICD-10-CM

## 2019-10-13 DIAGNOSIS — F5101 Primary insomnia: Secondary | ICD-10-CM

## 2019-10-13 DIAGNOSIS — E119 Type 2 diabetes mellitus without complications: Secondary | ICD-10-CM | POA: Diagnosis not present

## 2019-10-13 DIAGNOSIS — C9000 Multiple myeloma not having achieved remission: Secondary | ICD-10-CM

## 2019-10-13 DIAGNOSIS — Z1211 Encounter for screening for malignant neoplasm of colon: Secondary | ICD-10-CM

## 2019-10-13 MED ORDER — DIAZEPAM 5 MG PO TABS: 5.0000 mg | ORAL_TABLET | Freq: Every day | ORAL | 5 refills | Status: DC

## 2019-10-13 MED ORDER — ALLOPURINOL 300 MG PO TABS: ORAL_TABLET | ORAL | 1 refills | Status: DC

## 2019-10-13 MED ORDER — POTASSIUM CHLORIDE CRYS ER 20 MEQ PO TBCR: EXTENDED_RELEASE_TABLET | ORAL | 5 refills | Status: DC

## 2019-10-13 MED ORDER — ACYCLOVIR 800 MG PO TABS: ORAL_TABLET | ORAL | 5 refills | Status: DC

## 2019-10-13 MED ORDER — OMEPRAZOLE 20 MG PO CPDR: 20.0000 mg | DELAYED_RELEASE_CAPSULE | Freq: Every day | ORAL | 1 refills | Status: DC

## 2019-10-13 NOTE — Progress Notes (Signed)
   Subjective:  Audio plus video  Patient ID: Joanna Reid, female    DOB: [birth year], 54 y.o.   MRN: [id number]  HPI Patient calls in today for a check up on her medications,  Patient states everything is going well and she has no concerns.  She did not get her colonoscopy done before they stopped doing them at [facility] and feels she might need a new referral.  Virtual Visit via Video Note  I connected with Joanna Reid on 10/13/19 at  8:20 AM EDT by a video enabled telemedicine application and verified that I am speaking with the correct person using two identifiers.  Location: Patient: home Provider: office    I discussed the limitations of evaluation and management by telemedicine and the availability of in person appointments. The patient expressed understanding and agreed to proceed.  History of Present Illness:    Observations/Objective:   Assessment and Plan:   Follow Up Instructions:    I discussed the assessment and treatment plan with the patient. The patient was provided an opportunity to ask questions and all were answered. The patient agreed with the plan and demonstrated an understanding of the instructions.   The patient was advised to call back or seek an in-person evaluation if the symptoms worsen or if the condition fails to improve as anticipated.  I provided 30 minutes of non-face-to-face time during this encounter.  Patient states she now sees an endocrinologist yearly manages her diabetes and hyperlipidemia. States she is watching her diet. Sugars have been running good.  Compliant with medicine for blood pressure. Watching her salt intake. Does not miss a dose. Blood pressure good when checked elsewhere  Recently had COVID-19. Did develop an element of shortness of breath with it. Does have numerous risk factors. Fortunately went on and got a monoclonal antibody infusion which helped her considerably  Patient compliant with insomnia  medication. Generally takes most nights. No obvious morning drowsiness. Definitely helps patient sleep. Without it patient states would not get a good nights rest.    Review of Systems No headache, no major weight loss or weight gain, no chest pain no back pain abdominal pain no change in bowel habits complete ROS otherwise negative     Objective:   Physical Exam Virtual       Assessment & Plan:  Impression 1 hypertension. Excellent control were checked elsewhere patient to maintain diet discussed  2. Insomnia. Uses Valium significant improvement patient to maintain  3. Type 2 diabetes discussed. Now managed by specialist.  4. Hyperlipidemia. Now managed by specialist  5. Recent Covid clinical course discussed with patient now pretty much back to baseline thankfully  Medications refilled. Follow-up in 6 months. Diet exercise discussed  Greater than 50% of this 30 minute non-face to face visit was spent in counseling and discussion and coordination of care regarding the above diagnosis/diagnosies

## 2019-10-14 LAB — PROTEIN ELECTROPHORESIS, SERUM
A/G Ratio: 1.1 (ref 0.7–1.7)
Albumin ELP: 3.5 g/dL (ref 2.9–4.4)
Alpha-1-Globulin: 0.3 g/dL (ref 0.0–0.4)
Alpha-2-Globulin: 0.7 g/dL (ref 0.4–1.0)
Beta Globulin: 0.9 g/dL (ref 0.7–1.3)
Gamma Globulin: 1.3 g/dL (ref 0.4–1.8)
Globulin, Total: 3.2 g/dL (ref 2.2–3.9)
Total Protein ELP: 6.7 g/dL (ref 6.0–8.5)

## 2019-10-14 LAB — KAPPA/LAMBDA LIGHT CHAINS
Kappa free light chain: 43.6 mg/L — ABNORMAL HIGH (ref 3.3–19.4)
Kappa, lambda light chain ratio: 1.43 (ref 0.26–1.65)
Lambda free light chains: 30.4 mg/L — ABNORMAL HIGH (ref 5.7–26.3)

## 2019-10-15 LAB — IMMUNOFIXATION ELECTROPHORESIS
IgA: 147 mg/dL (ref 87–352)
IgG (Immunoglobin G), Serum: 1299 mg/dL (ref 586–1602)
IgM (Immunoglobulin M), Srm: 21 mg/dL — ABNORMAL LOW (ref 26–217)
Total Protein ELP: 6.8 g/dL (ref 6.0–8.5)

## 2019-10-16 LAB — HM DIABETES EYE EXAM

## 2019-10-19 ENCOUNTER — Encounter: Payer: Self-pay | Admitting: Family Medicine

## 2019-10-19 ENCOUNTER — Encounter (INDEPENDENT_AMBULATORY_CARE_PROVIDER_SITE_OTHER): Payer: Self-pay | Admitting: *Deleted

## 2019-10-21 ENCOUNTER — Ambulatory Visit (HOSPITAL_COMMUNITY): Payer: BC Managed Care – PPO

## 2019-10-21 ENCOUNTER — Other Ambulatory Visit (HOSPITAL_COMMUNITY): Payer: BC Managed Care – PPO

## 2019-10-21 ENCOUNTER — Ambulatory Visit (HOSPITAL_COMMUNITY): Payer: BC Managed Care – PPO | Admitting: Hematology

## 2019-10-21 NOTE — Progress Notes (Signed)

## 2019-10-22 ENCOUNTER — Other Ambulatory Visit (HOSPITAL_COMMUNITY): Payer: Self-pay | Admitting: Hematology

## 2019-10-22 NOTE — Telephone Encounter (Signed)
Okay per Dr. Katragadda to refill revlimid.   

## 2019-10-26 ENCOUNTER — Inpatient Hospital Stay (HOSPITAL_COMMUNITY): Payer: BC Managed Care – PPO | Admitting: Hematology

## 2019-10-26 ENCOUNTER — Inpatient Hospital Stay (HOSPITAL_COMMUNITY): Payer: BC Managed Care – PPO

## 2019-10-26 ENCOUNTER — Other Ambulatory Visit: Payer: Self-pay

## 2019-10-26 DIAGNOSIS — C9000 Multiple myeloma not having achieved remission: Secondary | ICD-10-CM

## 2019-10-26 DIAGNOSIS — Z5112 Encounter for antineoplastic immunotherapy: Secondary | ICD-10-CM | POA: Diagnosis not present

## 2019-10-26 DIAGNOSIS — C9001 Multiple myeloma in remission: Secondary | ICD-10-CM

## 2019-10-26 LAB — MAGNESIUM: Magnesium: 1.6 mg/dL — ABNORMAL LOW (ref 1.7–2.4)

## 2019-10-26 LAB — COMPREHENSIVE METABOLIC PANEL
ALT: 20 U/L (ref 0–44)
AST: 20 U/L (ref 15–41)
Albumin: 3.8 g/dL (ref 3.5–5.0)
Alkaline Phosphatase: 72 U/L (ref 38–126)
Anion gap: 9 (ref 5–15)
BUN: 22 mg/dL — ABNORMAL HIGH (ref 6–20)
CO2: 26 mmol/L (ref 22–32)
Calcium: 8.7 mg/dL — ABNORMAL LOW (ref 8.9–10.3)
Chloride: 105 mmol/L (ref 98–111)
Creatinine, Ser: 1.19 mg/dL — ABNORMAL HIGH (ref 0.44–1.00)
GFR calc Af Amer: 60 mL/min — ABNORMAL LOW (ref >60)
GFR calc non Af Amer: 51 mL/min — ABNORMAL LOW (ref >60)
Glucose, Bld: 120 mg/dL — ABNORMAL HIGH (ref 70–99)
Potassium: 4 mmol/L (ref 3.5–5.1)
Sodium: 140 mmol/L (ref 135–145)
Total Bilirubin: 0.7 mg/dL (ref 0.3–1.2)
Total Protein: 7.1 g/dL (ref 6.5–8.1)

## 2019-10-26 LAB — CBC WITH DIFFERENTIAL/PLATELET
Abs Immature Granulocytes: 0.02 10*3/uL (ref 0.00–0.07)
Basophils Absolute: 0 10*3/uL (ref 0.0–0.1)
Basophils Relative: 1 %
Eosinophils Absolute: 0.1 10*3/uL (ref 0.0–0.5)
Eosinophils Relative: 2 %
HCT: 33.4 % — ABNORMAL LOW (ref 36.0–46.0)
Hemoglobin: 10.3 g/dL — ABNORMAL LOW (ref 12.0–15.0)
Immature Granulocytes: 1 %
Lymphocytes Relative: 30 %
Lymphs Abs: 1 10*3/uL (ref 0.7–4.0)
MCH: 29.6 pg (ref 26.0–34.0)
MCHC: 30.8 g/dL (ref 30.0–36.0)
MCV: 96 fL (ref 80.0–100.0)
Monocytes Absolute: 0.4 10*3/uL (ref 0.1–1.0)
Monocytes Relative: 13 %
Neutro Abs: 1.8 10*3/uL (ref 1.7–7.7)
Neutrophils Relative %: 53 %
Platelets: 151 10*3/uL (ref 150–400)
RBC: 3.48 MIL/uL — ABNORMAL LOW (ref 3.87–5.11)
RDW: 17.1 % — ABNORMAL HIGH (ref 11.5–15.5)
WBC: 3.4 10*3/uL — ABNORMAL LOW (ref 4.0–10.5)
nRBC: 0 % (ref 0.0–0.2)

## 2019-10-26 MED ORDER — PROCHLORPERAZINE MALEATE 10 MG PO TABS: 10.0000 mg | ORAL_TABLET | Freq: Once | ORAL | Status: DC

## 2019-10-26 MED ORDER — BORTEZOMIB CHEMO SQ INJECTION 3.5 MG (2.5MG/ML)
1.3000 mg/m2 | Freq: Once | INTRAMUSCULAR | Status: AC
  Administered 2019-10-26: 12:00:00 3 mg via SUBCUTANEOUS
  Filled 2019-10-26: qty 1.2

## 2019-10-26 NOTE — Assessment & Plan Note (Addendum)
1.  IgG lambda multiple myeloma, stage III, del 17 p: -4 cycles of RVD from 11/01/2017 through 01/14/2018. -Stem cell transplant on 02/20/2018. -Maintenance Velcade every 2 weeks and Revlimid 10 mg 3 weeks on 1 week off started on 07/29/2018. -We reviewed myeloma labs from 10/12/2019.  M spike was negative.  Immunofixation was negative.  Free light chain ratio was 1.43.  Kappa light chains are 43.6.  Lambda light chains are 30.4. -White count is 3.4 with hemoglobin 10.3.  Creatinine was 1.19. -She will continue maintenance treatment.  She restarted Velcade and Revlimid around 10/12/2019 when she had Covid infection on 09/17/2019.  I will see her back in 2 months for follow-up with repeat labs.  She has follow-up at [facility] in August.  2.  Hypomagnesemia: -Magnesium today is 1.6.  She is taking 2 magnesium pills at lunchtime.  I have told her to increase it to 1 magnesium pill in the morning and 2 at lunchtime.  3.  Bone strengthening agents: -She will continue Zometa 3 mg every 3 months.  4.  Peripheral neuropathy: -She will continue Lyrica 200 mg twice daily.  This is stable.  5.  ID prophylaxis: -She will continue acyclovir twice daily.  6.  Diabetes: -She will continue Metformin Trulicity.

## 2019-10-26 NOTE — Patient Instructions (Signed)
[place] at [facility] Discharge Instructions  You were seen today by Dr. Delton Coombes. He went over your recent lab results. Continue Velcade. He will see you back in 2 months for labs and follow up.   Thank you for choosing [place] at [facility] to provide your oncology and hematology care.  To afford each patient quality time with our provider, please arrive at least 15 minutes before your scheduled appointment time.   If you have a lab appointment with the [facility] please come in thru the  Main Entrance and check in at the main information desk  You need to re-schedule your appointment should you arrive 10 or more minutes late.  We strive to give you quality time with our providers, and arriving late affects you and other patients whose appointments are after yours.  Also, if you no show three or more times for appointments you may be dismissed from the clinic at the providers discretion.     Again, thank you for choosing [facility]  Our hope is that these requests will decrease the amount of time that you wait before being seen by our physicians.       _____________________________________________________________  Should you have questions after your visit to [facility], please contact our office at [phone] between the hours of 8:00 a.m. and 4:30 p.m.  Voicemails left after 4:00 p.m. will not be returned until the following business day.  For prescription refill requests, have your pharmacy contact our office and allow 72 hours.    Cancer Center Support Programs:   > Cancer Support Group  2nd Tuesday of the month 1pm-2pm, Journey Room

## 2019-10-26 NOTE — Progress Notes (Signed)
Ok to treat today verbal order Dr. Delton Coombes.  Serum Creatinine 1.19 today for oncology follow up visit.   Patient tolerated Velcade injection with no complaints voiced.  Lab work reviewed.  See MAR for details.  Injection site clean and dry with no bruising or swelling noted.  Band aid applied.  VSS.  Patient left ambulatory with no s/s of distress noted.

## 2019-10-26 NOTE — Progress Notes (Signed)
[place], [place] [address]   CLINIC:  Medical Oncology/Hematology  PCP:  Mikey Kirschner, MD [address] [place] [address] [phone]   REASON FOR VISIT:  Follow-up for multiple myeloma    BRIEF ONCOLOGIC HISTORY:  Oncology History  Multiple myeloma ([place])  10/29/2017 Initial Diagnosis   Multiple myeloma ([place])   11/01/2017 - 01/24/2018 Chemotherapy   The patient had bortezomib SQ (VELCADE) chemo injection 3 mg, 1.3 mg/m2 = 3 mg, Subcutaneous,  Once, 5 of 5 cycles Administration: 3 mg (11/01/2017), 3 mg (11/08/2017), 3 mg (11/05/2017), 3 mg (11/22/2017), 3 mg (11/12/2017), 3 mg (11/29/2017), 3 mg (11/26/2017), 3 mg (12/03/2017), 3 mg (12/13/2017), 3 mg (12/20/2017), 3 mg (12/17/2017), 3 mg (12/24/2017), 3 mg (01/03/2018), 3 mg (01/10/2018), 3 mg (01/07/2018), 3 mg (01/14/2018), 3 mg (01/24/2018)  for chemotherapy treatment.    06/11/2018 -  Chemotherapy   The patient had bortezomib SQ (VELCADE) chemo injection 3 mg, 1.3 mg/m2 = 3 mg, Subcutaneous,  Once, 37 of 39 cycles Administration: 3 mg (06/11/2018), 3 mg (07/29/2018), 3 mg (08/21/2018), 3 mg (09/04/2018), 3 mg (09/18/2018), 3 mg (10/02/2018), 3 mg (10/16/2018), 3 mg (10/29/2018), 3 mg (11/13/2018), 3 mg (06/18/2018), 3 mg (06/25/2018), 3 mg (07/01/2018), 3 mg (07/08/2018), 3 mg (07/15/2018), 3 mg (11/27/2018), 3 mg (12/11/2018), 3 mg (12/25/2018), 3 mg (01/08/2019), 3 mg (01/22/2019), 3 mg (02/05/2019), 3 mg (02/19/2019), 3 mg (03/05/2019), 3 mg (03/19/2019), 3 mg (04/02/2019), 3 mg (04/16/2019), 3 mg (04/30/2019), 3 mg (05/20/2019), 3 mg (06/03/2019), 3 mg (06/17/2019), 3 mg (07/01/2019), 3 mg (07/15/2019), 3 mg (07/29/2019), 3 mg (08/12/2019), 3 mg (08/26/2019), 3 mg (09/09/2019), 3 mg (10/12/2019), 3 mg (10/26/2019)  for chemotherapy treatment.       CANCER STAGING: Cancer Staging Multiple myeloma ([place]) Staging form: Plasma Cell Myeloma and Plasma Cell Disorders, AJCC 8th Edition - Clinical: No stage assigned - Unsigned -  Clinical: No stage assigned - Unsigned    INTERVAL HISTORY:  Ms. Choung 54 y.o. female seen for follow-up of multiple myeloma.  She started back Velcade on 10/12/2019.  She had Covid infection on 09/17/2019.  She received outpatient antibiotic therapy.  She was not hospitalized.  She does not report any shortness of breath on exertion at this time.  She reports appetite 100%.  Energy levels are 75%.  No fevers or chills reported.  Numbness is well controlled with current regimen.  REVIEW OF SYSTEMS:  Review of Systems  Neurological: Positive for numbness.  All other systems reviewed and are negative.    PAST MEDICAL/SURGICAL HISTORY:  Past Medical History:  Diagnosis Date  . Acid reflux   . Allergic rhinitis   . Cancer ([place])    multiple myeloma  . Diabetes mellitus    type 2  . Gout   . Gout   . HBP (high blood pressure)   . History of kidney stones   . Migraines    Past Surgical History:  Procedure Laterality Date  . CESAREAN SECTION    . EXTRACORPOREAL SHOCK WAVE LITHOTRIPSY Left 10/10/2017   Procedure: LEFT EXTRACORPOREAL SHOCK WAVE LITHOTRIPSY (ESWL);  Surgeon: Bjorn Loser, MD;  Location: [facility];  Service: Urology;  Laterality: Left;  . EYE SURGERY    . HEMORRHOID SURGERY N/A 11/19/2012   Procedure: HEMORRHOIDECTOMY;  Surgeon: Jamesetta So, MD;  Location: [facility];  Service: General;  Laterality: N/A;  . kidney stones  1998  . LAPAROSCOPIC UNILATERAL SALPINGO OOPHERECTOMY  05/14/2012  Procedure: LAPAROSCOPIC UNILATERAL SALPINGO OOPHORECTOMY;  Surgeon: [name], MD;  Location: [facility];  Service: Gynecology;  Laterality: Right;  laparoscopic right salpingo-oophorectomy  . PARTIAL HYSTERECTOMY    . TONSILECTOMY, ADENOIDECTOMY, BILATERAL MYRINGOTOMY AND TUBES    . VESICOVAGINAL FISTULA CLOSURE W/ TAH       SOCIAL HISTORY:  Social History   Socioeconomic History  . Marital status: Married    Spouse name: Not on file  . Number of children: Not on file  . Years  of education: 89  . Highest education level: Not on file  Occupational History    Employer: [employer]  Tobacco Use  . Smoking status: Former Smoker    Quit date: 02/27/1999    Years since quitting: 20.6  . Smokeless tobacco: Never Used  . Tobacco comment: socially   Substance and Sexual Activity  . Alcohol use: No    Alcohol/week: 0.0 standard drinks  . Drug use: No  . Sexual activity: Yes  Other Topics Concern  . Not on file  Social History Narrative  . Not on file   Social Determinants of Health   Financial Resource Strain:   . Difficulty of Paying Living Expenses:   Food Insecurity:   . Worried About Charity fundraiser in the Last Year:   . Arboriculturist in the Last Year:   Transportation Needs:   . Film/video editor (Medical):   Marland Kitchen Lack of Transportation (Non-Medical):   Physical Activity:   . Days of Exercise per Week:   . Minutes of Exercise per Session:   Stress:   . Feeling of Stress :   Social Connections:   . Frequency of Communication with Friends and Family:   . Frequency of Social Gatherings with Friends and Family:   . Attends Religious Services:   . Active Member of Clubs or Organizations:   . Attends Archivist Meetings:   Marland Kitchen Marital Status:   Intimate Partner Violence:   . Fear of Current or Ex-Partner:   . Emotionally Abused:   Marland Kitchen Physically Abused:   . Sexually Abused:     FAMILY HISTORY:  Family History  Problem Relation Age of Onset  . Arthritis Other   . Cancer Other   . Diabetes Other   . Hypertension Mother   . Dementia Mother   . Diabetes Father   . ALS Father   . Diabetes Brother   . Hypertension Brother   . Cancer Paternal Aunt   . COPD Maternal Grandmother   . Cancer Maternal Grandfather   . Anesthesia problems Paternal Grandfather     CURRENT MEDICATIONS:  Outpatient Encounter Medications as of 10/26/2019  Medication Sig Note  . acyclovir (ZOVIRAX) 800 MG tablet TAKE (1) TABLET BY MOUTH TWICE DAILY.   Marland Kitchen  allopurinol (ZYLOPRIM) 300 MG tablet TAKE 1 TABLET DAILY   . aspirin EC 81 MG tablet Take 81 mg by mouth daily.   . Bortezomib (VELCADE IJ) Inject as directed every 14 (fourteen) days.  08/09/2018: Due for next injection on 08/13/2018  . bumetanide (BUMEX) 0.5 MG tablet Take 0.5 mg by mouth daily.   . carvedilol (COREG) 25 MG tablet Take 25 mg by mouth 2 (two) times daily.   . cetirizine (ZYRTEC) 10 MG tablet Take 1 tablet (10 mg total) by mouth daily.   . diazepam (VALIUM) 5 MG tablet Take 1 tablet (5 mg total) by mouth at bedtime.   . docusate sodium (COLACE) 100 MG capsule Take  100 mg by mouth daily.    . fluconazole (DIFLUCAN) 200 MG tablet Take 200 mg by mouth daily.   . fluticasone (FLONASE) 50 MCG/ACT nasal spray Place 2 sprays into both nostrils daily.   Marland Kitchen lenalidomide (REVLIMID) 10 MG capsule TAKE 1 CAPSULE BY MOUTH  DAILY FOR 21 DAYS ON, THEN  7 DAYS OFF   . losartan (COZAAR) 25 MG tablet Take 25 mg by mouth daily.    . magnesium oxide (MAG-OX) 400 (241.3 Mg) MG tablet Take 1 tablet (400 mg total) by mouth 3 (three) times daily.   . metFORMIN (GLUCOPHAGE) 500 MG tablet TAKE 1 TABLET BY MOUTH ONCE DAILY WITH BREAKFAST (Patient taking differently: Take 1,000 mg by mouth 2 (two) times daily with a meal. )   . omeprazole (PRILOSEC) 20 MG capsule Take 1 capsule (20 mg total) by mouth daily.   . potassium chloride SA (KLOR-CON) 20 MEQ tablet TAKE (1) TABLET BY MOUTH TWICE DAILY.   Marland Kitchen pregabalin (LYRICA) 200 MG capsule TAKE (1) CAPSULE BY MOUTH TWICE DAILY.   . rosuvastatin (CRESTOR) 5 MG tablet Take 5 mg by mouth daily.    . TRULICITY 3.74 MO/7.0BE SOPN Inject 0.75 mg into the skin every Monday.    Marland Kitchen albuterol (PROVENTIL HFA;VENTOLIN HFA) 108 (90 Base) MCG/ACT inhaler Inhale 2 puffs into the lungs every 4 (four) hours as needed for wheezing or shortness of breath. (Patient not taking: Reported on 10/26/2019)   . HYDROcodone-acetaminophen (NORCO/VICODIN) 5-325 MG tablet Take 1 tablet by mouth at  bedtime as needed for moderate pain. (Patient not taking: Reported on 10/26/2019)   . ondansetron (ZOFRAN) 8 MG tablet Take 1 tablet (8 mg total) by mouth every 8 (eight) hours as needed. (Patient not taking: Reported on 10/26/2019)   . prochlorperazine (COMPAZINE) 10 MG tablet Take 1 tablet (10 mg total) by mouth every 6 (six) hours as needed for nausea or vomiting. (Patient not taking: Reported on 10/26/2019)    No facility-administered encounter medications on file as of 10/26/2019.    ALLERGIES:  No Known Allergies   PHYSICAL EXAM:  ECOG Performance status: 1  Vitals:   10/26/19 1036  BP: 130/86  Pulse: 65  Resp: 18  Temp: (!) 96 F (35.6 C)  SpO2: 98%   Filed Weights   10/26/19 1036  Weight: 246 lb 14.4 oz (112 kg)    Physical Exam Vitals reviewed.  Constitutional:      Appearance: Normal appearance.  Cardiovascular:     Rate and Rhythm: Normal rate and regular rhythm.     Heart sounds: Normal heart sounds.  Pulmonary:     Effort: Pulmonary effort is normal.     Breath sounds: Normal breath sounds.  Abdominal:     General: There is no distension.     Palpations: Abdomen is soft. There is no mass.  Musculoskeletal:        General: No swelling.  Skin:    General: Skin is warm.  Neurological:     General: No focal deficit present.     Mental Status: She is alert and oriented to person, place, and time.  Psychiatric:        Mood and Affect: Mood normal.        Behavior: Behavior normal.      LABORATORY DATA:  I have reviewed the labs as listed.  CBC    Component Value Date/Time   WBC 3.4 (L) 10/26/2019 0958   RBC 3.48 (L) 10/26/2019 0958   HGB 10.3 (  L) 10/26/2019 0958   HGB 14.2 08/02/2015 1113   HCT 33.4 (L) 10/26/2019 0958   HCT 41.1 08/02/2015 1113   PLT 151 10/26/2019 0958   PLT 371 08/02/2015 1113   MCV 96.0 10/26/2019 0958   MCV 84 08/02/2015 1113   MCH 29.6 10/26/2019 0958   MCHC 30.8 10/26/2019 0958   RDW 17.1 (H) 10/26/2019 0958   RDW  15.1 08/02/2015 1113   LYMPHSABS 1.0 10/26/2019 0958   LYMPHSABS 2.9 08/02/2015 1113   MONOABS 0.4 10/26/2019 0958   EOSABS 0.1 10/26/2019 0958   EOSABS 0.2 08/02/2015 1113   BASOSABS 0.0 10/26/2019 0958   BASOSABS 0.1 08/02/2015 1113   CMP Latest Ref Rng & Units 10/26/2019 10/12/2019 09/28/2019  Glucose 70 - 99 mg/dL 120(H) 100(H) 113(H)  BUN 6 - 20 mg/dL 22(H) 24(H) 28(H)  Creatinine 0.44 - 1.00 mg/dL 1.19(H) 0.93 1.01(H)  Sodium 135 - 145 mmol/L 140 140 144  Potassium 3.5 - 5.1 mmol/L 4.0 3.7 3.7  Chloride 98 - 111 mmol/L 105 105 109  CO2 22 - 32 mmol/L 26 24 25   Calcium 8.9 - 10.3 mg/dL 8.7(L) 9.6 9.5  Total Protein 6.5 - 8.1 g/dL 7.1 7.3 7.5  Total Bilirubin 0.3 - 1.2 mg/dL 0.7 0.5 0.3  Alkaline Phos 38 - 126 U/L 72 65 61  AST 15 - 41 U/L 20 16 18   ALT 0 - 44 U/L 20 12 12        DIAGNOSTIC IMAGING:  I have reviewed scans.   I have reviewed Venita Lick LPN's note and agree with the documentation.  I personally performed a face-to-face visit, made revisions and my assessment and plan is as follows.    ASSESSMENT & PLAN:   Multiple myeloma ([place]) 1.  IgG lambda multiple myeloma, stage III, del 17 p: -4 cycles of RVD from 11/01/2017 through 01/14/2018. -Stem cell transplant on 02/20/2018. -Maintenance Velcade every 2 weeks and Revlimid 10 mg 3 weeks on 1 week off started on 07/29/2018. -We reviewed myeloma labs from 10/12/2019.  M spike was negative.  Immunofixation was negative.  Free light chain ratio was 1.43.  Kappa light chains are 43.6.  Lambda light chains are 30.4. -White count is 3.4 with hemoglobin 10.3.  Creatinine was 1.19. -She will continue maintenance treatment.  She restarted Velcade and Revlimid around 10/12/2019 when she had Covid infection on 09/17/2019.  I will see her back in 2 months for follow-up with repeat labs.  She has follow-up at [facility] in August.  2.  Hypomagnesemia: -Magnesium today is 1.6.  She is taking 2 magnesium pills at lunchtime.  I have told her  to increase it to 1 magnesium pill in the morning and 2 at lunchtime.  3.  Bone strengthening agents: -She will continue Zometa 3 mg every 3 months.  4.  Peripheral neuropathy: -She will continue Lyrica 200 mg twice daily.  This is stable.  5.  ID prophylaxis: -She will continue acyclovir twice daily.  6.  Diabetes: -She will continue Metformin Trulicity.   Orders placed this encounter:  No orders of the defined types were placed in this encounter.     Derek Jack, MD [place] [phone]

## 2019-11-09 ENCOUNTER — Encounter: Payer: Self-pay | Admitting: Obstetrics & Gynecology

## 2019-11-09 ENCOUNTER — Other Ambulatory Visit: Payer: Self-pay

## 2019-11-09 ENCOUNTER — Other Ambulatory Visit (HOSPITAL_COMMUNITY): Payer: Self-pay | Admitting: Obstetrics & Gynecology

## 2019-11-09 ENCOUNTER — Inpatient Hospital Stay (HOSPITAL_COMMUNITY): Payer: BC Managed Care – PPO

## 2019-11-09 ENCOUNTER — Inpatient Hospital Stay (HOSPITAL_COMMUNITY): Payer: BC Managed Care – PPO | Attending: Hematology

## 2019-11-09 ENCOUNTER — Encounter (HOSPITAL_COMMUNITY): Payer: Self-pay

## 2019-11-09 ENCOUNTER — Ambulatory Visit (INDEPENDENT_AMBULATORY_CARE_PROVIDER_SITE_OTHER): Payer: BC Managed Care – PPO | Admitting: Obstetrics & Gynecology

## 2019-11-09 VITALS — BP 116/84 | HR 73 | Ht 64.0 in | Wt 245.0 lb

## 2019-11-09 VITALS — BP 124/78 | HR 71 | Temp 97.6°F | Resp 18 | Wt 244.4 lb

## 2019-11-09 DIAGNOSIS — Z5112 Encounter for antineoplastic immunotherapy: Secondary | ICD-10-CM | POA: Diagnosis present

## 2019-11-09 DIAGNOSIS — Z1231 Encounter for screening mammogram for malignant neoplasm of breast: Secondary | ICD-10-CM

## 2019-11-09 DIAGNOSIS — C9000 Multiple myeloma not having achieved remission: Secondary | ICD-10-CM

## 2019-11-09 DIAGNOSIS — Z01419 Encounter for gynecological examination (general) (routine) without abnormal findings: Secondary | ICD-10-CM | POA: Diagnosis not present

## 2019-11-09 DIAGNOSIS — Z1211 Encounter for screening for malignant neoplasm of colon: Secondary | ICD-10-CM

## 2019-11-09 DIAGNOSIS — Z1212 Encounter for screening for malignant neoplasm of rectum: Secondary | ICD-10-CM | POA: Diagnosis not present

## 2019-11-09 LAB — COMPREHENSIVE METABOLIC PANEL
ALT: 16 U/L (ref 0–44)
AST: 16 U/L (ref 15–41)
Albumin: 4.1 g/dL (ref 3.5–5.0)
Alkaline Phosphatase: 69 U/L (ref 38–126)
Anion gap: 9 (ref 5–15)
BUN: 30 mg/dL — ABNORMAL HIGH (ref 6–20)
CO2: 24 mmol/L (ref 22–32)
Calcium: 9.9 mg/dL (ref 8.9–10.3)
Chloride: 103 mmol/L (ref 98–111)
Creatinine, Ser: 1.09 mg/dL — ABNORMAL HIGH (ref 0.44–1.00)
GFR calc Af Amer: >60 mL/min (ref >60)
GFR calc non Af Amer: 57 mL/min — ABNORMAL LOW (ref >60)
Glucose, Bld: 111 mg/dL — ABNORMAL HIGH (ref 70–99)
Potassium: 4.1 mmol/L (ref 3.5–5.1)
Sodium: 136 mmol/L (ref 135–145)
Total Bilirubin: 0.4 mg/dL (ref 0.3–1.2)
Total Protein: 7.3 g/dL (ref 6.5–8.1)

## 2019-11-09 LAB — CBC WITH DIFFERENTIAL/PLATELET
Abs Immature Granulocytes: 0.02 10*3/uL (ref 0.00–0.07)
Basophils Absolute: 0.1 10*3/uL (ref 0.0–0.1)
Basophils Relative: 2 %
Eosinophils Absolute: 0.1 10*3/uL (ref 0.0–0.5)
Eosinophils Relative: 2 %
HCT: 35.2 % — ABNORMAL LOW (ref 36.0–46.0)
Hemoglobin: 11.3 g/dL — ABNORMAL LOW (ref 12.0–15.0)
Immature Granulocytes: 1 %
Lymphocytes Relative: 28 %
Lymphs Abs: 1.1 10*3/uL (ref 0.7–4.0)
MCH: 30.8 pg (ref 26.0–34.0)
MCHC: 32.1 g/dL (ref 30.0–36.0)
MCV: 95.9 fL (ref 80.0–100.0)
Monocytes Absolute: 0.6 10*3/uL (ref 0.1–1.0)
Monocytes Relative: 14 %
Neutro Abs: 2.1 10*3/uL (ref 1.7–7.7)
Neutrophils Relative %: 53 %
Platelets: 212 10*3/uL (ref 150–400)
RBC: 3.67 MIL/uL — ABNORMAL LOW (ref 3.87–5.11)
RDW: 16.8 % — ABNORMAL HIGH (ref 11.5–15.5)
WBC: 3.9 10*3/uL — ABNORMAL LOW (ref 4.0–10.5)
nRBC: 0 % (ref 0.0–0.2)

## 2019-11-09 LAB — LACTATE DEHYDROGENASE: LDH: 108 U/L (ref 98–192)

## 2019-11-09 MED ORDER — PROCHLORPERAZINE MALEATE 10 MG PO TABS: 10.0000 mg | ORAL_TABLET | Freq: Once | ORAL | Status: DC

## 2019-11-09 MED ORDER — BORTEZOMIB CHEMO SQ INJECTION 3.5 MG (2.5MG/ML)
1.3000 mg/m2 | Freq: Once | INTRAMUSCULAR | Status: AC
  Administered 2019-11-09: 3 mg via SUBCUTANEOUS
  Filled 2019-11-09: qty 1.2

## 2019-11-09 MED ORDER — VALACYCLOVIR HCL 1 G PO TABS: ORAL_TABLET | ORAL | 0 refills | Status: DC

## 2019-11-09 NOTE — Progress Notes (Signed)
Patient tolerated Velcade injection with no complaints voiced.  Lab work reviewed.  See MAR for details.  Injection site clean and dry with no bruising or swelling noted.  Band aid applied.  VSS.  Patient left ambulatory with no s/s of distress noted.  

## 2019-11-09 NOTE — Progress Notes (Signed)
Subjective:     Joanna Reid is a 54 y.o. female here for a routine exam.  No LMP recorded. Patient has had a hysterectomy. No obstetric history on file. Birth Control Method:  hysterectomy Menstrual Calendar(currently): amenorrheic s/p hysterectomy  Current complaints: none.   Current acute medical issues:  Multiple myeloma   Recent Gynecologic History No LMP recorded. Patient has had a hysterectomy. Last Pap: n/a,  normal Last mammogram: 2018,  normal  Past Medical History:  Diagnosis Date  . Acid reflux   . Allergic rhinitis   . Cancer ([place])    multiple myeloma  . Diabetes mellitus    type 2  . Gout   . Gout   . HBP (high blood pressure)   . History of kidney stones   . Migraines     Past Surgical History:  Procedure Laterality Date  . CESAREAN SECTION    . EXTRACORPOREAL SHOCK WAVE LITHOTRIPSY Left 10/10/2017   Procedure: LEFT EXTRACORPOREAL SHOCK WAVE LITHOTRIPSY (ESWL);  Surgeon: Bjorn Loser, MD;  Location: [facility];  Service: Urology;  Laterality: Left;  . EYE SURGERY    . HEMORRHOID SURGERY N/A 11/19/2012   Procedure: HEMORRHOIDECTOMY;  Surgeon: Jamesetta So, MD;  Location: [facility];  Service: General;  Laterality: N/A;  . kidney stones  1998  . LAPAROSCOPIC UNILATERAL SALPINGO OOPHERECTOMY  05/14/2012   Procedure: LAPAROSCOPIC UNILATERAL SALPINGO OOPHORECTOMY;  Surgeon: [name], MD;  Location: [facility];  Service: Gynecology;  Laterality: Right;  laparoscopic right salpingo-oophorectomy  . PARTIAL HYSTERECTOMY    . TONSILECTOMY, ADENOIDECTOMY, BILATERAL MYRINGOTOMY AND TUBES    . VESICOVAGINAL FISTULA CLOSURE W/ TAH      OB History   No obstetric history on file.     Social History   Socioeconomic History  . Marital status: Married    Spouse name: Not on file  . Number of children: Not on file  . Years of education: 25  . Highest education level: Not on file  Occupational History    Employer: [employer]  Tobacco Use  . Smoking status: Former  Smoker    Quit date: 02/27/1999    Years since quitting: 20.7  . Smokeless tobacco: Never Used  . Tobacco comment: socially   Substance and Sexual Activity  . Alcohol use: No    Alcohol/week: 0.0 standard drinks  . Drug use: No  . Sexual activity: Yes  Other Topics Concern  . Not on file  Social History Narrative  . Not on file   Social Determinants of Health   Financial Resource Strain:   . Difficulty of Paying Living Expenses:   Food Insecurity:   . Worried About Charity fundraiser in the Last Year:   . Arboriculturist in the Last Year:   Transportation Needs:   . Film/video editor (Medical):   Marland Kitchen Lack of Transportation (Non-Medical):   Physical Activity:   . Days of Exercise per Week:   . Minutes of Exercise per Session:   Stress:   . Feeling of Stress :   Social Connections:   . Frequency of Communication with Friends and Family:   . Frequency of Social Gatherings with Friends and Family:   . Attends Religious Services:   . Active Member of Clubs or Organizations:   . Attends Archivist Meetings:   Marland Kitchen Marital Status:     Family History  Problem Relation Age of Onset  . Arthritis Other   . Cancer Other   .  Diabetes Other   . Hypertension Mother   . Dementia Mother   . Diabetes Father   . ALS Father   . Diabetes Brother   . Hypertension Brother   . Cancer Paternal Aunt   . COPD Maternal Grandmother   . Cancer Maternal Grandfather   . Anesthesia problems Paternal Grandfather      Current Outpatient Medications:  .  acyclovir (ZOVIRAX) 800 MG tablet, TAKE (1) TABLET BY MOUTH TWICE DAILY., Disp: 60 tablet, Rfl: 5 .  albuterol (PROVENTIL HFA;VENTOLIN HFA) 108 (90 Base) MCG/ACT inhaler, Inhale 2 puffs into the lungs every 4 (four) hours as needed for wheezing or shortness of breath., Disp: 1 Inhaler, Rfl: 3 .  allopurinol (ZYLOPRIM) 300 MG tablet, TAKE 1 TABLET DAILY, Disp: 90 tablet, Rfl: 1 .  aspirin EC 81 MG tablet, Take 81 mg by mouth daily.,  Disp: , Rfl:  .  Bortezomib (VELCADE IJ), Inject as directed every 14 (fourteen) days. , Disp: , Rfl:  .  bumetanide (BUMEX) 0.5 MG tablet, Take 0.5 mg by mouth every other day. , Disp: , Rfl:  .  carvedilol (COREG) 25 MG tablet, Take 25 mg by mouth 2 (two) times daily., Disp: , Rfl:  .  cetirizine (ZYRTEC) 10 MG tablet, Take 1 tablet (10 mg total) by mouth daily., Disp: 30 tablet, Rfl: 0 .  diazepam (VALIUM) 5 MG tablet, Take 1 tablet (5 mg total) by mouth at bedtime., Disp: 30 tablet, Rfl: 5 .  docusate sodium (COLACE) 100 MG capsule, Take 100 mg by mouth daily. , Disp: , Rfl:  .  fluticasone (FLONASE) 50 MCG/ACT nasal spray, Place 2 sprays into both nostrils daily., Disp: 16 g, Rfl: 0 .  lenalidomide (REVLIMID) 10 MG capsule, TAKE 1 CAPSULE BY MOUTH  DAILY FOR 21 DAYS ON, THEN  7 DAYS OFF, Disp: 21 capsule, Rfl: 1 .  losartan (COZAAR) 25 MG tablet, Take 25 mg by mouth daily. , Disp: , Rfl:  .  magnesium oxide (MAG-OX) 400 (241.3 Mg) MG tablet, Take 1 tablet (400 mg total) by mouth 3 (three) times daily., Disp: 90 tablet, Rfl: 3 .  metFORMIN (GLUCOPHAGE) 500 MG tablet, TAKE 1 TABLET BY MOUTH ONCE DAILY WITH BREAKFAST (Patient taking differently: Take 1,000 mg by mouth 2 (two) times daily with a meal. ), Disp: 90 tablet, Rfl: 0 .  omeprazole (PRILOSEC) 20 MG capsule, Take 1 capsule (20 mg total) by mouth daily., Disp: 90 capsule, Rfl: 1 .  potassium chloride SA (KLOR-CON) 20 MEQ tablet, TAKE (1) TABLET BY MOUTH TWICE DAILY., Disp: 60 tablet, Rfl: 5 .  pregabalin (LYRICA) 200 MG capsule, TAKE (1) CAPSULE BY MOUTH TWICE DAILY., Disp: 60 capsule, Rfl: 4 .  prochlorperazine (COMPAZINE) 10 MG tablet, Take 1 tablet (10 mg total) by mouth every 6 (six) hours as needed for nausea or vomiting., Disp: 30 tablet, Rfl: 2 .  rosuvastatin (CRESTOR) 5 MG tablet, Take 5 mg by mouth daily. , Disp: , Rfl:  .  TRULICITY 7.82 UM/3.5TI SOPN, Inject 0.75 mg into the skin every Monday. , Disp: , Rfl:  .   HYDROcodone-acetaminophen (NORCO/VICODIN) 5-325 MG tablet, Take 1 tablet by mouth at bedtime as needed for moderate pain. (Patient not taking: Reported on 10/26/2019), Disp: 30 tablet, Rfl: 0 .  ondansetron (ZOFRAN) 8 MG tablet, Take 1 tablet (8 mg total) by mouth every 8 (eight) hours as needed. (Patient not taking: Reported on 10/26/2019), Disp: 30 tablet, Rfl: 3  Review of Systems  Review of  Systems  Constitutional: Negative for fever, chills, weight loss, malaise/fatigue and diaphoresis.  HENT: Negative for hearing loss, ear pain, nosebleeds, congestion, sore throat, neck pain, tinnitus and ear discharge.   Eyes: Negative for blurred vision, double vision, photophobia, pain, discharge and redness.  Respiratory: Negative for cough, hemoptysis, sputum production, shortness of breath, wheezing and stridor.   Cardiovascular: Negative for chest pain, palpitations, orthopnea, claudication, leg swelling and PND.  Gastrointestinal: negative for abdominal pain. Negative for heartburn, nausea, vomiting, diarrhea, constipation, blood in stool and melena.  Genitourinary: Negative for dysuria, urgency, frequency, hematuria and flank pain.  Musculoskeletal: Negative for myalgias, back pain, joint pain and falls.  Skin: Negative for itching and rash.  Neurological: Negative for dizziness, tingling, tremors, sensory change, speech change, focal weakness, seizures, loss of consciousness, weakness and headaches.  Endo/Heme/Allergies: Negative for environmental allergies and polydipsia. Does not bruise/bleed easily.  Psychiatric/Behavioral: Negative for depression, suicidal ideas, hallucinations, memory loss and substance abuse. The patient is not nervous/anxious and does not have insomnia.        Objective:  Blood pressure 116/84, pulse 73, height 5' 4"  (1.626 m), weight 245 lb (111.1 kg).   Physical Exam  Vitals reviewed. Constitutional: She is oriented to person, place, and time. She appears  well-developed and well-nourished.  HENT:  Head: Normocephalic and atraumatic.        Right Ear: External ear normal.  Left Ear: External ear normal.  Nose: Nose normal.  Mouth/Throat: Oropharynx is clear and moist.  Eyes: Conjunctivae and EOM are normal. Pupils are equal, round, and reactive to light. Right eye exhibits no discharge. Left eye exhibits no discharge. No scleral icterus.  Neck: Normal range of motion. Neck supple. No tracheal deviation present. No thyromegaly present.  Cardiovascular: Normal rate, regular rhythm, normal heart sounds and intact distal pulses.  Exam reveals no gallop and no friction rub.   No murmur heard. Respiratory: Effort normal and breath sounds normal. No respiratory distress. She has no wheezes. She has no rales. She exhibits no tenderness.  GI: Soft. Bowel sounds are normal. She exhibits no distension and no mass. There is no tenderness. There is no rebound and no guarding.  Genitourinary:  Breasts no masses skin changes or nipple changes bilaterally      Vulva is normal without lesions Vagina is pink moist without discharge Cervix absent Uterus is absent Adnexa is negative  {Rectal    hemoccult negative, normal tone, no masses  Musculoskeletal: Normal range of motion. She exhibits no edema and no tenderness.  Neurological: She is alert and oriented to person, place, and time. She has normal reflexes. She displays normal reflexes. No cranial nerve deficit. She exhibits normal muscle tone. Coordination normal.  Skin: Skin is warm and dry. No rash noted. No erythema. No pallor.  Psychiatric: She has a normal mood and affect. Her behavior is normal. Judgment and thought content normal.       Medications Ordered at today's visit: No orders of the defined types were placed in this encounter.   Other orders placed at today's visit: No orders of the defined types were placed in this encounter.     Assessment:    Normal Gyn exam.   S/P  hysterectomy and RSO Plan:    Mammogram ordered. Follow up in: 3 years.     Return in about 3 years (around 11/09/2022) for yearly.

## 2019-11-09 NOTE — Progress Notes (Signed)
Labs reviewed with MD .ok to proceed with treatment today per MD. BUN 30 noted .

## 2019-11-10 LAB — PROTEIN ELECTROPHORESIS, SERUM
A/G Ratio: 1.3 (ref 0.7–1.7)
Albumin ELP: 3.8 g/dL (ref 2.9–4.4)
Alpha-1-Globulin: 0.2 g/dL (ref 0.0–0.4)
Alpha-2-Globulin: 0.7 g/dL (ref 0.4–1.0)
Beta Globulin: 1 g/dL (ref 0.7–1.3)
Gamma Globulin: 1 g/dL (ref 0.4–1.8)
Globulin, Total: 3 g/dL (ref 2.2–3.9)
Total Protein ELP: 6.8 g/dL (ref 6.0–8.5)

## 2019-11-10 LAB — KAPPA/LAMBDA LIGHT CHAINS
Kappa free light chain: 28.8 mg/L — ABNORMAL HIGH (ref 3.3–19.4)
Kappa, lambda light chain ratio: 1.57 (ref 0.26–1.65)
Lambda free light chains: 18.3 mg/L (ref 5.7–26.3)

## 2019-11-11 LAB — IMMUNOFIXATION ELECTROPHORESIS
IgA: 119 mg/dL (ref 87–352)
IgG (Immunoglobin G), Serum: 1119 mg/dL (ref 586–1602)
IgM (Immunoglobulin M), Srm: 19 mg/dL — ABNORMAL LOW (ref 26–217)
Total Protein ELP: 6.9 g/dL (ref 6.0–8.5)

## 2019-11-12 ENCOUNTER — Ambulatory Visit (HOSPITAL_COMMUNITY)
Admission: RE | Admit: 2019-11-12 | Discharge: 2019-11-12 | Disposition: A | Discharge status: Home / Self Care | Payer: BC Managed Care – PPO | Source: Ambulatory Visit | Attending: Obstetrics & Gynecology | Admitting: Obstetrics & Gynecology

## 2019-11-12 ENCOUNTER — Encounter (HOSPITAL_COMMUNITY): Payer: Self-pay

## 2019-11-12 ENCOUNTER — Other Ambulatory Visit: Payer: Self-pay

## 2019-11-12 DIAGNOSIS — Z1231 Encounter for screening mammogram for malignant neoplasm of breast: Secondary | ICD-10-CM | POA: Diagnosis present

## 2019-11-16 ENCOUNTER — Other Ambulatory Visit (HOSPITAL_COMMUNITY): Payer: Self-pay | Admitting: *Deleted

## 2019-11-18 ENCOUNTER — Other Ambulatory Visit (HOSPITAL_COMMUNITY): Payer: Self-pay | Admitting: Hematology

## 2019-11-23 ENCOUNTER — Inpatient Hospital Stay (HOSPITAL_COMMUNITY): Payer: BC Managed Care – PPO

## 2019-11-23 ENCOUNTER — Other Ambulatory Visit: Payer: Self-pay

## 2019-11-23 ENCOUNTER — Encounter (HOSPITAL_COMMUNITY): Payer: Self-pay

## 2019-11-23 VITALS — BP 140/74 | HR 73 | Temp 96.2°F | Resp 18 | Wt 245.8 lb

## 2019-11-23 DIAGNOSIS — Z5112 Encounter for antineoplastic immunotherapy: Secondary | ICD-10-CM | POA: Diagnosis not present

## 2019-11-23 DIAGNOSIS — C9000 Multiple myeloma not having achieved remission: Secondary | ICD-10-CM

## 2019-11-23 LAB — COMPREHENSIVE METABOLIC PANEL
ALT: 24 U/L (ref 0–44)
AST: 22 U/L (ref 15–41)
Albumin: 3.9 g/dL (ref 3.5–5.0)
Alkaline Phosphatase: 66 U/L (ref 38–126)
Anion gap: 9 (ref 5–15)
BUN: 25 mg/dL — ABNORMAL HIGH (ref 6–20)
CO2: 26 mmol/L (ref 22–32)
Calcium: 9.1 mg/dL (ref 8.9–10.3)
Chloride: 106 mmol/L (ref 98–111)
Creatinine, Ser: 1.06 mg/dL — ABNORMAL HIGH (ref 0.44–1.00)
GFR calc Af Amer: >60 mL/min (ref >60)
GFR calc non Af Amer: 59 mL/min — ABNORMAL LOW (ref >60)
Glucose, Bld: 114 mg/dL — ABNORMAL HIGH (ref 70–99)
Potassium: 4 mmol/L (ref 3.5–5.1)
Sodium: 141 mmol/L (ref 135–145)
Total Bilirubin: 0.8 mg/dL (ref 0.3–1.2)
Total Protein: 7 g/dL (ref 6.5–8.1)

## 2019-11-23 LAB — CBC WITH DIFFERENTIAL/PLATELET
Abs Immature Granulocytes: 0.01 10*3/uL (ref 0.00–0.07)
Basophils Absolute: 0.1 10*3/uL (ref 0.0–0.1)
Basophils Relative: 2 %
Eosinophils Absolute: 0.2 10*3/uL (ref 0.0–0.5)
Eosinophils Relative: 5 %
HCT: 34.2 % — ABNORMAL LOW (ref 36.0–46.0)
Hemoglobin: 10.9 g/dL — ABNORMAL LOW (ref 12.0–15.0)
Immature Granulocytes: 0 %
Lymphocytes Relative: 32 %
Lymphs Abs: 0.9 10*3/uL (ref 0.7–4.0)
MCH: 30.7 pg (ref 26.0–34.0)
MCHC: 31.9 g/dL (ref 30.0–36.0)
MCV: 96.3 fL (ref 80.0–100.0)
Monocytes Absolute: 0.4 10*3/uL (ref 0.1–1.0)
Monocytes Relative: 12 %
Neutro Abs: 1.4 10*3/uL — ABNORMAL LOW (ref 1.7–7.7)
Neutrophils Relative %: 49 %
Platelets: 157 10*3/uL (ref 150–400)
RBC: 3.55 MIL/uL — ABNORMAL LOW (ref 3.87–5.11)
RDW: 16.7 % — ABNORMAL HIGH (ref 11.5–15.5)
WBC: 2.9 10*3/uL — ABNORMAL LOW (ref 4.0–10.5)
nRBC: 0 % (ref 0.0–0.2)

## 2019-11-23 MED ORDER — PROCHLORPERAZINE MALEATE 10 MG PO TABS: 10.0000 mg | ORAL_TABLET | Freq: Once | ORAL | Status: DC

## 2019-11-23 MED ORDER — BORTEZOMIB CHEMO SQ INJECTION 3.5 MG (2.5MG/ML)
1.3000 mg/m2 | Freq: Once | INTRAMUSCULAR | Status: AC
  Administered 2019-11-23: 3 mg via SUBCUTANEOUS
  Filled 2019-11-23: qty 1.2

## 2019-11-23 NOTE — Patient Instructions (Signed)
[facility] Discharge Instructions for Patients Receiving Chemotherapy   Beginning January 23rd 2017 lab work for the Cancer Center will be done in the  Main lab at [facility] on 1st floor. If you have a lab appointment with the Cancer Center please come in thru the  Main Entrance and check in at the main information desk   Today you received the following chemotherapy agents Velcade injection. Follow-up as scheduled. Call clinic for any questions or concerns  To help prevent nausea and vomiting after your treatment, we encourage you to take your nausea medication   If you develop nausea and vomiting, or diarrhea that is not controlled by your medication, call the clinic.  The clinic phone number is [phone]. Office hours are Monday-Friday 8:30am-5:00pm.  BELOW ARE SYMPTOMS THAT SHOULD BE REPORTED IMMEDIATELY:  *FEVER GREATER THAN 101.0 F  *CHILLS WITH OR WITHOUT FEVER  NAUSEA AND VOMITING THAT IS NOT CONTROLLED WITH YOUR NAUSEA MEDICATION  *UNUSUAL SHORTNESS OF BREATH  *UNUSUAL BRUISING OR BLEEDING  TENDERNESS IN MOUTH AND THROAT WITH OR WITHOUT PRESENCE OF ULCERS  *URINARY PROBLEMS  *BOWEL PROBLEMS  UNUSUAL RASH Items with * indicate a potential emergency and should be followed up as soon as possible. If you have an emergency after office hours please contact your primary care physician or go to the nearest emergency department.  Please call the clinic during office hours if you have any questions or concerns.   You may also contact the Patient Navigator at [phone] should you have any questions or need assistance in obtaining follow up care.      Resources For Cancer Patients and their Caregivers ? American Cancer Society: Can assist with transportation, wigs, general needs, runs Look Good Feel Better.        1-888-227-6333 ? Cancer Care: Provides financial assistance, online support groups, medication/co-pay assistance.   1-800-813-HOPE (4673) ? [facility] Assists [place] cancer patients and their families through emotional , educational and financial support.  336-427-4357 ? [place] DSS Where to apply for food stamps, Medicaid and utility assistance. 336-342-1394 ? [facility]: Transportation to medical appointments. 336-347-2287 ? Social Security Administration: May apply for disability if have a Stage IV cancer. 336-342-7796 1-800-772-1213 ? [place] Aging, Disability and Transit Services: Assists with nutrition, care and transit needs. 336-349-2343         

## 2019-11-23 NOTE — Progress Notes (Signed)
[place] reviewed with Dr. Delton Coombes and pt approved for Velcade injection today per MD                                                       Joanna Reid tolerated Velcade injection well without complaints or incident. VSS Pt discharged self ambulatory in satisfactory condition

## 2019-11-30 ENCOUNTER — Encounter (INDEPENDENT_AMBULATORY_CARE_PROVIDER_SITE_OTHER): Payer: Self-pay | Admitting: *Deleted

## 2019-11-30 ENCOUNTER — Other Ambulatory Visit (INDEPENDENT_AMBULATORY_CARE_PROVIDER_SITE_OTHER): Payer: Self-pay | Admitting: *Deleted

## 2019-11-30 ENCOUNTER — Telehealth (INDEPENDENT_AMBULATORY_CARE_PROVIDER_SITE_OTHER): Payer: Self-pay | Admitting: *Deleted

## 2019-11-30 NOTE — Telephone Encounter (Signed)
Patient needs Plenvu (copay card) ° °

## 2019-12-01 ENCOUNTER — Telehealth (INDEPENDENT_AMBULATORY_CARE_PROVIDER_SITE_OTHER): Payer: Self-pay | Admitting: *Deleted

## 2019-12-01 ENCOUNTER — Other Ambulatory Visit (INDEPENDENT_AMBULATORY_CARE_PROVIDER_SITE_OTHER): Payer: Self-pay | Admitting: *Deleted

## 2019-12-01 DIAGNOSIS — Z1211 Encounter for screening for malignant neoplasm of colon: Secondary | ICD-10-CM

## 2019-12-01 MED ORDER — PLENVU 140 G PO SOLR: 1.0000 | Freq: Once | ORAL | 0 refills | Status: AC

## 2019-12-01 NOTE — Telephone Encounter (Signed)
Referring MD/PCP: steve luking   Procedure: tcs w propofol  Reason/Indication:  screening  Has patient had this procedure before?  no  If so, when, by whom and where?    Is there a family history of colon cancer?  yes  Who?  What age when diagnosed?    Is patient diabetic?   yes      Does patient have prosthetic heart valve or mechanical valve?  no  Do you have a pacemaker/defibrillator?  no  Has patient ever had endocarditis/atrial fibrillation? no  Does patient use oxygen? no  Has patient had joint replacement within last 12 months?  no  Is patient constipated or do they take laxatives? no  Does patient have a history of alcohol/drug use?  no  Is patient on blood thinner such as Coumadin, Plavix and/or Aspirin? yes  Medications:  Outpatient Encounter Medications as of 12/01/2019  Medication Sig Note  . acyclovir (ZOVIRAX) 800 MG tablet TAKE (1) TABLET BY MOUTH TWICE DAILY.   Marland Kitchen albuterol (PROVENTIL HFA;VENTOLIN HFA) 108 (90 Base) MCG/ACT inhaler Inhale 2 puffs into the lungs every 4 (four) hours as needed for wheezing or shortness of breath.   . allopurinol (ZYLOPRIM) 300 MG tablet TAKE 1 TABLET DAILY   . aspirin EC 81 MG tablet Take 81 mg by mouth daily.   . betamethasone acetate-betamethasone sodium phosphate (CELESTONE) 6 (3-3) MG/ML injection Inject 1 vial    10 cc equal parts betamethasone, marcaine, and 1% lidocaine into right knee joint.   . Bortezomib (VELCADE IJ) Inject as directed every 14 (fourteen) days.  08/09/2018: Due for next injection on 08/13/2018  . bumetanide (BUMEX) 0.5 MG tablet Take 0.5 mg by mouth every other day.    . carvedilol (COREG) 25 MG tablet Take 25 mg by mouth 2 (two) times daily.   . cetirizine (ZYRTEC) 10 MG tablet Take 1 tablet (10 mg total) by mouth daily.   . diazepam (VALIUM) 5 MG tablet Take 1 tablet (5 mg total) by mouth at bedtime.   . docusate sodium (COLACE) 100 MG capsule Take 100 mg by mouth daily.    . fluticasone (FLONASE) 50  MCG/ACT nasal spray Place 2 sprays into both nostrils daily.   Marland Kitchen HYDROcodone-acetaminophen (NORCO/VICODIN) 5-325 MG tablet Take 1 tablet by mouth at bedtime as needed for moderate pain.   Marland Kitchen lenalidomide (REVLIMID) 10 MG capsule TAKE 1 CAPSULE BY MOUTH  DAILY FOR 21 DAYS ON, THEN  7 DAYS OFF   . losartan (COZAAR) 25 MG tablet Take 25 mg by mouth daily.    . magnesium oxide (MAG-OX) 400 (241.3 Mg) MG tablet Take 1 tablet (400 mg total) by mouth 3 (three) times daily.   . metFORMIN (GLUCOPHAGE) 500 MG tablet TAKE 1 TABLET BY MOUTH ONCE DAILY WITH BREAKFAST (Patient taking differently: Take 1,000 mg by mouth 2 (two) times daily with a meal. )   . omeprazole (PRILOSEC) 20 MG capsule Take 1 capsule (20 mg total) by mouth daily.   . ondansetron (ZOFRAN) 8 MG tablet Take 1 tablet (8 mg total) by mouth every 8 (eight) hours as needed.   Marland Kitchen PEG-KCl-NaCl-NaSulf-Na Asc-C (PLENVU) 140 g SOLR Take 1 kit by mouth once for 1 dose.   . potassium chloride SA (KLOR-CON) 20 MEQ tablet TAKE (1) TABLET BY MOUTH TWICE DAILY.   Marland Kitchen pregabalin (LYRICA) 200 MG capsule TAKE (1) CAPSULE BY MOUTH TWICE DAILY.   Marland Kitchen prochlorperazine (COMPAZINE) 10 MG tablet Take 1 tablet (10 mg total) by mouth  every 6 (six) hours as needed for nausea or vomiting.   . rosuvastatin (CRESTOR) 5 MG tablet Take 5 mg by mouth daily.    . TRULICITY 1.83 UP/7.3HD SOPN Inject 0.75 mg into the skin every Monday.    . valACYclovir (VALTREX) 1000 MG tablet TAKE 2 TABLETS NOW, THEN 2 TABLETS 12 HOURS LATER.    No facility-administered encounter medications on file as of 12/01/2019.   Allergies: nkda  Medication Adjustment per Dr Laural Golden: asa 2 days, decrease humalog to 1 unit w/ meal day before, don't take metformin day before, no change to trulicity  Procedure date & time: 12/25/19

## 2019-12-08 ENCOUNTER — Other Ambulatory Visit: Payer: Self-pay

## 2019-12-08 ENCOUNTER — Encounter (HOSPITAL_COMMUNITY): Payer: Self-pay

## 2019-12-08 ENCOUNTER — Inpatient Hospital Stay (HOSPITAL_COMMUNITY): Payer: BC Managed Care – PPO

## 2019-12-08 ENCOUNTER — Inpatient Hospital Stay (HOSPITAL_COMMUNITY): Payer: BC Managed Care – PPO | Attending: Hematology

## 2019-12-08 VITALS — BP 145/89 | HR 59 | Temp 97.3°F | Resp 18 | Wt 254.8 lb

## 2019-12-08 DIAGNOSIS — Z5112 Encounter for antineoplastic immunotherapy: Secondary | ICD-10-CM | POA: Diagnosis not present

## 2019-12-08 DIAGNOSIS — C9000 Multiple myeloma not having achieved remission: Secondary | ICD-10-CM | POA: Insufficient documentation

## 2019-12-08 LAB — COMPREHENSIVE METABOLIC PANEL
ALT: 15 U/L (ref 0–44)
AST: 17 U/L (ref 15–41)
Albumin: 3.7 g/dL (ref 3.5–5.0)
Alkaline Phosphatase: 66 U/L (ref 38–126)
Anion gap: 10 (ref 5–15)
BUN: 25 mg/dL — ABNORMAL HIGH (ref 6–20)
CO2: 25 mmol/L (ref 22–32)
Calcium: 9.5 mg/dL (ref 8.9–10.3)
Chloride: 105 mmol/L (ref 98–111)
Creatinine, Ser: 1.02 mg/dL — ABNORMAL HIGH (ref 0.44–1.00)
GFR calc Af Amer: >60 mL/min (ref >60)
GFR calc non Af Amer: >60 mL/min (ref >60)
Glucose, Bld: 98 mg/dL (ref 70–99)
Potassium: 3.9 mmol/L (ref 3.5–5.1)
Sodium: 140 mmol/L (ref 135–145)
Total Bilirubin: 0.7 mg/dL (ref 0.3–1.2)
Total Protein: 6.7 g/dL (ref 6.5–8.1)

## 2019-12-08 LAB — CBC WITH DIFFERENTIAL/PLATELET
Abs Immature Granulocytes: 0.01 10*3/uL (ref 0.00–0.07)
Basophils Absolute: 0.1 10*3/uL (ref 0.0–0.1)
Basophils Relative: 4 %
Eosinophils Absolute: 0.1 10*3/uL (ref 0.0–0.5)
Eosinophils Relative: 3 %
HCT: 32.5 % — ABNORMAL LOW (ref 36.0–46.0)
Hemoglobin: 10.3 g/dL — ABNORMAL LOW (ref 12.0–15.0)
Immature Granulocytes: 0 %
Lymphocytes Relative: 31 %
Lymphs Abs: 0.8 10*3/uL (ref 0.7–4.0)
MCH: 30.7 pg (ref 26.0–34.0)
MCHC: 31.7 g/dL (ref 30.0–36.0)
MCV: 97 fL (ref 80.0–100.0)
Monocytes Absolute: 0.4 10*3/uL (ref 0.1–1.0)
Monocytes Relative: 17 %
Neutro Abs: 1.2 10*3/uL — ABNORMAL LOW (ref 1.7–7.7)
Neutrophils Relative %: 45 %
Platelets: 192 10*3/uL (ref 150–400)
RBC: 3.35 MIL/uL — ABNORMAL LOW (ref 3.87–5.11)
RDW: 16.4 % — ABNORMAL HIGH (ref 11.5–15.5)
WBC: 2.5 10*3/uL — ABNORMAL LOW (ref 4.0–10.5)
nRBC: 0 % (ref 0.0–0.2)

## 2019-12-08 LAB — LACTATE DEHYDROGENASE: LDH: 124 U/L (ref 98–192)

## 2019-12-08 MED ORDER — PROCHLORPERAZINE MALEATE 10 MG PO TABS: 10.0000 mg | ORAL_TABLET | Freq: Once | ORAL | Status: DC

## 2019-12-08 MED ORDER — BORTEZOMIB CHEMO SQ INJECTION 3.5 MG (2.5MG/ML)
1.3000 mg/m2 | Freq: Once | INTRAMUSCULAR | Status: AC
  Administered 2019-12-08: 3 mg via SUBCUTANEOUS
  Filled 2019-12-08: qty 1.2

## 2019-12-08 NOTE — Progress Notes (Signed)
Labs reviewed with Dr. Delton Coombes - he is aware of [place] 1200.  It is okay to proceed with Velcade injection today per MD.

## 2019-12-09 LAB — PROTEIN ELECTROPHORESIS, SERUM
A/G Ratio: 1.3 (ref 0.7–1.7)
Albumin ELP: 3.4 g/dL (ref 2.9–4.4)
Alpha-1-Globulin: 0.2 g/dL (ref 0.0–0.4)
Alpha-2-Globulin: 0.6 g/dL (ref 0.4–1.0)
Beta Globulin: 1 g/dL (ref 0.7–1.3)
Gamma Globulin: 0.8 g/dL (ref 0.4–1.8)
Globulin, Total: 2.6 g/dL (ref 2.2–3.9)
Total Protein ELP: 6 g/dL (ref 6.0–8.5)

## 2019-12-09 LAB — KAPPA/LAMBDA LIGHT CHAINS
Kappa free light chain: 32.1 mg/L — ABNORMAL HIGH (ref 3.3–19.4)
Kappa, lambda light chain ratio: 1.64 (ref 0.26–1.65)
Lambda free light chains: 19.6 mg/L (ref 5.7–26.3)

## 2019-12-13 LAB — IMMUNOFIXATION ELECTROPHORESIS
IgA: 111 mg/dL (ref 87–352)
IgG (Immunoglobin G), Serum: 866 mg/dL (ref 586–1602)
IgM (Immunoglobulin M), Srm: 13 mg/dL — ABNORMAL LOW (ref 26–217)
Total Protein ELP: 6.1 g/dL (ref 6.0–8.5)

## 2019-12-15 ENCOUNTER — Other Ambulatory Visit (HOSPITAL_COMMUNITY): Payer: Self-pay | Admitting: Hematology

## 2019-12-16 ENCOUNTER — Telehealth (HOSPITAL_COMMUNITY): Payer: Self-pay | Admitting: Pharmacy Technician

## 2019-12-16 NOTE — Telephone Encounter (Signed)
Oral Oncology Patient Advocate Encounter  Received notification from [facility] [place] that prior authorization for Revlimid is required.  PA submitted on CoverMyMeds Key [id number]  Status is pending  Oral Oncology Clinic will continue to follow.  [place] Patient [place] Phone [phone] Fax [phone] 12/16/2019 3:40 PM

## 2019-12-16 NOTE — Telephone Encounter (Signed)
Oral Oncology Patient Advocate Encounter  Prior Authorization for Revlimid has been approved.    PA# [id number] Effective dates: 12/16/19 through 12/15/20  PA approval has been shared with [place].  Oral Oncology Clinic will continue to follow.   [place] Patient [place] Phone [phone] Fax [phone] 12/16/2019 4:28 PM

## 2019-12-22 ENCOUNTER — Inpatient Hospital Stay (HOSPITAL_COMMUNITY): Payer: BC Managed Care – PPO

## 2019-12-22 ENCOUNTER — Inpatient Hospital Stay (HOSPITAL_COMMUNITY): Payer: BC Managed Care – PPO | Admitting: Hematology

## 2019-12-22 ENCOUNTER — Other Ambulatory Visit: Payer: Self-pay

## 2019-12-22 VITALS — BP 126/75 | HR 62 | Temp 96.1°F | Resp 17 | Wt 245.8 lb

## 2019-12-22 DIAGNOSIS — C9000 Multiple myeloma not having achieved remission: Secondary | ICD-10-CM

## 2019-12-22 DIAGNOSIS — Z5112 Encounter for antineoplastic immunotherapy: Secondary | ICD-10-CM | POA: Diagnosis not present

## 2019-12-22 DIAGNOSIS — C9001 Multiple myeloma in remission: Secondary | ICD-10-CM | POA: Diagnosis not present

## 2019-12-22 LAB — COMPREHENSIVE METABOLIC PANEL
ALT: 19 U/L (ref 0–44)
AST: 20 U/L (ref 15–41)
Albumin: 4.2 g/dL (ref 3.5–5.0)
Alkaline Phosphatase: 60 U/L (ref 38–126)
Anion gap: 11 (ref 5–15)
BUN: 30 mg/dL — ABNORMAL HIGH (ref 6–20)
CO2: 27 mmol/L (ref 22–32)
Calcium: 9.7 mg/dL (ref 8.9–10.3)
Chloride: 104 mmol/L (ref 98–111)
Creatinine, Ser: 1.22 mg/dL — ABNORMAL HIGH (ref 0.44–1.00)
GFR calc Af Amer: 58 mL/min — ABNORMAL LOW (ref >60)
GFR calc non Af Amer: 50 mL/min — ABNORMAL LOW (ref >60)
Glucose, Bld: 108 mg/dL — ABNORMAL HIGH (ref 70–99)
Potassium: 4 mmol/L (ref 3.5–5.1)
Sodium: 142 mmol/L (ref 135–145)
Total Bilirubin: 0.7 mg/dL (ref 0.3–1.2)
Total Protein: 7.2 g/dL (ref 6.5–8.1)

## 2019-12-22 LAB — CBC WITH DIFFERENTIAL/PLATELET
Abs Immature Granulocytes: 0.02 10*3/uL (ref 0.00–0.07)
Basophils Absolute: 0 10*3/uL (ref 0.0–0.1)
Basophils Relative: 1 %
Eosinophils Absolute: 0.1 10*3/uL (ref 0.0–0.5)
Eosinophils Relative: 3 %
HCT: 35.6 % — ABNORMAL LOW (ref 36.0–46.0)
Hemoglobin: 11.4 g/dL — ABNORMAL LOW (ref 12.0–15.0)
Immature Granulocytes: 1 %
Lymphocytes Relative: 32 %
Lymphs Abs: 1 10*3/uL (ref 0.7–4.0)
MCH: 31.1 pg (ref 26.0–34.0)
MCHC: 32 g/dL (ref 30.0–36.0)
MCV: 97 fL (ref 80.0–100.0)
Monocytes Absolute: 0.5 10*3/uL (ref 0.1–1.0)
Monocytes Relative: 16 %
Neutro Abs: 1.4 10*3/uL — ABNORMAL LOW (ref 1.7–7.7)
Neutrophils Relative %: 47 %
Platelets: 153 10*3/uL (ref 150–400)
RBC: 3.67 MIL/uL — ABNORMAL LOW (ref 3.87–5.11)
RDW: 15.6 % — ABNORMAL HIGH (ref 11.5–15.5)
WBC: 2.9 10*3/uL — ABNORMAL LOW (ref 4.0–10.5)
nRBC: 0 % (ref 0.0–0.2)

## 2019-12-22 LAB — MAGNESIUM: Magnesium: 1.9 mg/dL (ref 1.7–2.4)

## 2019-12-22 MED ORDER — BORTEZOMIB CHEMO SQ INJECTION 3.5 MG (2.5MG/ML)
1.3000 mg/m2 | Freq: Once | INTRAMUSCULAR | Status: AC
  Administered 2019-12-22: 3 mg via SUBCUTANEOUS
  Filled 2019-12-22: qty 1.2

## 2019-12-22 MED ORDER — PROCHLORPERAZINE MALEATE 10 MG PO TABS: 10.0000 mg | ORAL_TABLET | Freq: Once | ORAL | Status: DC

## 2019-12-22 NOTE — Progress Notes (Signed)
ANC 1.4 today and ok to treat verbal order Dr. Delton Coombes.  Lab notified of add on Magnesium level for today.   Patient tolerated injection with no complaints voiced.  Site clean and dry with no bruising or swelling noted at site.  Band aid applied.  Vss with discharge and left ambulatory with no s/s of distress noted.

## 2019-12-22 NOTE — Progress Notes (Signed)
[place], [place] [address]   CLINIC:  Medical Oncology/Hematology  PCP:  Joanna Reid, [place] [address] [phone]   REASON FOR VISIT:  Follow-up for multiple myeloma  PRIOR THERAPY: 1. RVD 4 cycles from 11/01/2017 through 01/14/2018 2. Stem cell transplant on 02/20/2018  CURRENT THERAPY: Velcade  BRIEF ONCOLOGIC HISTORY:  Oncology History  Multiple myeloma ([place])  10/29/2017 Initial Diagnosis   Multiple myeloma ([place])   11/01/2017 - 01/24/2018 Chemotherapy   The patient had bortezomib SQ (VELCADE) chemo injection 3 mg, 1.3 mg/m2 = 3 mg, Subcutaneous,  Once, 5 of 5 cycles Administration: 3 mg (11/01/2017), 3 mg (11/08/2017), 3 mg (11/05/2017), 3 mg (11/22/2017), 3 mg (11/12/2017), 3 mg (11/29/2017), 3 mg (11/26/2017), 3 mg (12/03/2017), 3 mg (12/13/2017), 3 mg (12/20/2017), 3 mg (12/17/2017), 3 mg (12/24/2017), 3 mg (01/03/2018), 3 mg (01/10/2018), 3 mg (01/07/2018), 3 mg (01/14/2018), 3 mg (01/24/2018)  for chemotherapy treatment.    06/11/2018 -  Chemotherapy   The patient had bortezomib SQ (VELCADE) chemo injection 3 mg, 1.3 mg/m2 = 3 mg, Subcutaneous,  Once, 40 of 45 cycles Administration: 3 mg (06/11/2018), 3 mg (07/29/2018), 3 mg (08/21/2018), 3 mg (09/04/2018), 3 mg (09/18/2018), 3 mg (10/02/2018), 3 mg (10/16/2018), 3 mg (10/29/2018), 3 mg (11/13/2018), 3 mg (06/18/2018), 3 mg (06/25/2018), 3 mg (07/01/2018), 3 mg (07/08/2018), 3 mg (07/15/2018), 3 mg (11/27/2018), 3 mg (12/11/2018), 3 mg (12/25/2018), 3 mg (01/08/2019), 3 mg (01/22/2019), 3 mg (02/05/2019), 3 mg (02/19/2019), 3 mg (03/05/2019), 3 mg (03/19/2019), 3 mg (04/02/2019), 3 mg (04/16/2019), 3 mg (04/30/2019), 3 mg (05/20/2019), 3 mg (06/03/2019), 3 mg (06/17/2019), 3 mg (07/01/2019), 3 mg (07/15/2019), 3 mg (07/29/2019), 3 mg (08/12/2019), 3 mg (08/26/2019), 3 mg (09/09/2019), 3 mg (10/12/2019), 3 mg (10/26/2019), 3 mg (11/09/2019), 3 mg (11/23/2019), 3 mg (12/08/2019)  for chemotherapy treatment.       CANCER STAGING: Cancer Staging Multiple myeloma ([place]) Staging form: Plasma Cell Myeloma and Plasma Cell Disorders, AJCC 8th Edition - Clinical: No stage assigned - Unsigned - Clinical: No stage assigned - Unsigned   INTERVAL HISTORY:  [name], a 54 y.o. female, returns for routine follow-up and consideration for next cycle of chemotherapy. Joanna Reid was last seen on 10/26/2019.  Due for cycle #41 of Velcade today.   Overall, she tells me she has been feeling pretty well. She reports numbness and burning in her feet for which she takes Lyrica, but no new pains. The neuropathy is stable from last visit. She denies N/V/D. She is taking Lasix every other day and still taking magnesium.   She is going to [facility] in August.  Overall, she feels ready for next cycle of chemo today.    REVIEW OF SYSTEMS:  Review of Systems  Constitutional: Negative for appetite change and fatigue.  Gastrointestinal: Negative for diarrhea, nausea and vomiting.  Neurological: Positive for numbness (feet).  All other systems reviewed and are negative.   PAST MEDICAL/SURGICAL HISTORY:  Past Medical History:  Diagnosis Date  . Acid reflux   . Allergic rhinitis   . Cancer ([place])    multiple myeloma  . Diabetes mellitus    type 2  . Gout   . Gout   . HBP (high blood pressure)   . History of kidney stones   . Migraines    Past Surgical History:  Procedure Laterality Date  . BREAST CYST EXCISION Left    2009  no visible scar on skin  . CESAREAN SECTION    . EXTRACORPOREAL SHOCK WAVE LITHOTRIPSY Left 10/10/2017   Procedure: LEFT EXTRACORPOREAL SHOCK WAVE LITHOTRIPSY (ESWL);  Surgeon: MacDiarmid, Scott, MD;  Location: [facility];  Service: Urology;  Laterality: Left;  . EYE SURGERY    . HEMORRHOID SURGERY N/A 11/19/2012   Procedure: HEMORRHOIDECTOMY;  Surgeon: Mark A Jenkins, MD;  Location: [facility];  Service: General;  Laterality: N/A;  . kidney stones  1998  . LAPAROSCOPIC UNILATERAL SALPINGO  OOPHERECTOMY  05/14/2012   Procedure: LAPAROSCOPIC UNILATERAL SALPINGO OOPHORECTOMY;  Surgeon: Luther H Eure, MD;  Location: [facility];  Service: Gynecology;  Laterality: Right;  laparoscopic right salpingo-oophorectomy  . PARTIAL HYSTERECTOMY    . TONSILECTOMY, ADENOIDECTOMY, BILATERAL MYRINGOTOMY AND TUBES    . VESICOVAGINAL FISTULA CLOSURE W/ TAH      SOCIAL HISTORY:  Social History   Socioeconomic History  . Marital status: Married    Spouse name: Not on file  . Number of children: Not on file  . Years of education: 12  . Highest education level: Not on file  Occupational History    Employer: [employer]  Tobacco Use  . Smoking status: Former Smoker    Quit date: 02/27/1999    Years since quitting: 20.8  . Smokeless tobacco: Never Used  . Tobacco comment: socially   Vaping Use  . Vaping Use: Never used  Substance and Sexual Activity  . Alcohol use: No    Alcohol/week: 0.0 standard drinks  . Drug use: No  . Sexual activity: Yes  Other Topics Concern  . Not on file  Social History Narrative  . Not on file   Social Determinants of Health   Financial Resource Strain: Low Risk   . Difficulty of Paying Living Expenses: Not hard at all  Food Insecurity: No Food Insecurity  . Worried About Running Out of Food in the Last Year: Never true  . Ran Out of Food in the Last Year: Never true  Transportation Needs: No Transportation Needs  . Lack of Transportation (Medical): No  . Lack of Transportation (Non-Medical): No  Physical Activity: Insufficiently Active  . Days of Exercise per Week: 7 days  . Minutes of Exercise per Session: 10 min  Stress:   . Feeling of Stress :   Social Connections: Socially Integrated  . Frequency of Communication with Friends and Family: More than three times a week  . Frequency of Social Gatherings with Friends and Family: More than three times a week  . Attends Religious Services: More than 4 times per year  . Active Member of Clubs or Organizations:  Yes  . Attends Club or Organization Meetings: More than 4 times per year  . Marital Status: Married  Intimate Partner Violence: Not At Risk  . Fear of Current or Ex-Partner: No  . Emotionally Abused: No  . Physically Abused: No  . Sexually Abused: No    FAMILY HISTORY:  Family History  Problem Relation Age of Onset  . Arthritis Other   . Cancer Other   . Diabetes Other   . Hypertension Mother   . Dementia Mother   . Diabetes Father   . ALS Father   . Diabetes Brother   . Hypertension Brother   . Cancer Paternal Aunt   . COPD Maternal Grandmother   . Cancer Maternal Grandfather   . Anesthesia problems Paternal Grandfather     CURRENT MEDICATIONS:  Current Outpatient Medications  Medication Sig Dispense Refill  .   acyclovir (ZOVIRAX) 400 MG tablet TAKE (1) TABLET BY MOUTH TWICE DAILY. 60 tablet 6  . allopurinol (ZYLOPRIM) 300 MG tablet TAKE 1 TABLET DAILY (Patient taking differently: Take 300 mg by mouth daily. ) 90 tablet 1  . aspirin EC 81 MG tablet Take 81 mg by mouth daily.    . Bortezomib (VELCADE IJ) Inject as directed every 14 (fourteen) days.     . bumetanide (BUMEX) 0.5 MG tablet Take 0.5 mg by mouth daily.     . carvedilol (COREG) 25 MG tablet Take 25 mg by mouth 2 (two) times daily.    . cetirizine (ZYRTEC) 10 MG tablet Take 1 tablet (10 mg total) by mouth daily. 30 tablet 0  . diazepam (VALIUM) 5 MG tablet Take 1 tablet (5 mg total) by mouth at bedtime. 30 tablet 5  . docusate sodium (COLACE) 100 MG capsule Take 100 mg by mouth daily.     . fluticasone (FLONASE) 50 MCG/ACT nasal spray Place 2 sprays into both nostrils daily. 16 g 0  . HYDROcodone-acetaminophen (NORCO/VICODIN) 5-325 MG tablet Take 1 tablet by mouth at bedtime as needed for moderate pain. 30 tablet 0  . lenalidomide (REVLIMID) 10 MG capsule TAKE 1 CAPSULE BY MOUTH  DAILY FOR 21 DAYS ON, THEN  7 DAYS OFF 21 capsule 1  . loratadine (CLARITIN) 10 MG tablet Take 10 mg by mouth daily.    Marland Kitchen losartan  (COZAAR) 25 MG tablet Take 25 mg by mouth in the morning and at bedtime.     . Magnesium 200 MG TABS Take 600 mg by mouth daily.    . magnesium oxide (MAG-OX) 400 (241.3 Mg) MG tablet Take 1 tablet (400 mg total) by mouth 3 (three) times daily. 90 tablet 3  . metFORMIN (GLUCOPHAGE) 500 MG tablet TAKE 1 TABLET BY MOUTH ONCE DAILY WITH BREAKFAST (Patient taking differently: Take 1,000 mg by mouth 2 (two) times daily with a meal. ) 90 tablet 0  . omeprazole (PRILOSEC) 20 MG capsule Take 1 capsule (20 mg total) by mouth daily. 90 capsule 1  . potassium chloride SA (KLOR-CON) 20 MEQ tablet TAKE (1) TABLET BY MOUTH TWICE DAILY. (Patient taking differently: Take 20 mEq by mouth daily. ) 60 tablet 5  . pregabalin (LYRICA) 200 MG capsule TAKE (1) CAPSULE BY MOUTH TWICE DAILY. (Patient taking differently: Take 200 mg by mouth daily. ) 60 capsule 4  . rosuvastatin (CRESTOR) 5 MG tablet Take 5 mg by mouth daily.     . TRULICITY 1.5 IR/6.7EL SOPN Inject 0.75 mg into the skin every Monday.     . valACYclovir (VALTREX) 1000 MG tablet TAKE 2 TABLETS NOW, THEN 2 TABLETS 12 HOURS LATER. 12 tablet 0  . albuterol (PROVENTIL HFA;VENTOLIN HFA) 108 (90 Base) MCG/ACT inhaler Inhale 2 puffs into the lungs every 4 (four) hours as needed for wheezing or shortness of breath. (Patient not taking: Reported on 12/22/2019) 1 Inhaler 3  . ondansetron (ZOFRAN) 8 MG tablet Take 1 tablet (8 mg total) by mouth every 8 (eight) hours as needed. (Patient not taking: Reported on 12/22/2019) 30 tablet 3  . prochlorperazine (COMPAZINE) 10 MG tablet Take 1 tablet (10 mg total) by mouth every 6 (six) hours as needed for nausea or vomiting. (Patient not taking: Reported on 12/22/2019) 30 tablet 2   No current facility-administered medications for this visit.    ALLERGIES:  No Known Allergies  PHYSICAL EXAM:  Performance status (ECOG): 1 - Symptomatic but completely ambulatory  Vitals:  12/22/19 1101  BP: 126/75  Pulse: 62  Resp: 17    Temp: (!) 96.1 F (35.6 C)  SpO2: 99%   Wt Readings from Last 3 Encounters:  12/22/19 245 lb 12.8 oz (111.5 kg)  12/08/19 254 lb 12.8 oz (115.6 kg)  11/23/19 245 lb 12.8 oz (111.5 kg)   Physical Exam Vitals reviewed.  Constitutional:      Appearance: Normal appearance. She is obese.  Cardiovascular:     Rate and Rhythm: Normal rate and regular rhythm.     Pulses: Normal pulses.     Heart sounds: Normal heart sounds.  Pulmonary:     Effort: Pulmonary effort is normal.     Breath sounds: Normal breath sounds.  Abdominal:     Palpations: Abdomen is soft. There is no mass.     Tenderness: There is no abdominal tenderness.     Hernia: No hernia is present.  Neurological:     General: No focal deficit present.     Mental Status: She is alert and oriented to person, place, and time.  Psychiatric:        Mood and Affect: Mood normal.        Behavior: Behavior normal.     LABORATORY DATA:  I have reviewed the labs as listed.  CBC Latest Ref Rng & Units 12/22/2019 12/08/2019 11/23/2019  WBC 4.0 - 10.5 K/uL 2.9(L) 2.5(L) 2.9(L)  Hemoglobin 12.0 - 15.0 g/dL 11.4(L) 10.3(L) 10.9(L)  Hematocrit 36 - 46 % 35.6(L) 32.5(L) 34.2(L)  Platelets 150 - 400 K/uL 153 192 157   CMP Latest Ref Rng & Units 12/22/2019 12/08/2019 11/23/2019  Glucose 70 - 99 mg/dL 108(H) 98 114(H)  BUN 6 - 20 mg/dL 30(H) 25(H) 25(H)  Creatinine 0.44 - 1.00 mg/dL 1.22(H) 1.02(H) 1.06(H)  Sodium 135 - 145 mmol/L 142 140 141  Potassium 3.5 - 5.1 mmol/L 4.0 3.9 4.0  Chloride 98 - 111 mmol/L 104 105 106  CO2 22 - 32 mmol/L 27 25 26  Calcium 8.9 - 10.3 mg/dL 9.7 9.5 9.1  Total Protein 6.5 - 8.1 g/dL 7.2 6.7 7.0  Total Bilirubin 0.3 - 1.2 mg/dL 0.7 0.7 0.8  Alkaline Phos 38 - 126 U/L 60 66 66  AST 15 - 41 U/L 20 17 22  ALT 0 - 44 U/L 19 15 24   Lab Results  Component Value Date   TOTALPROTELP 6.0 12/08/2019   TOTALPROTELP 6.1 12/08/2019   ALBUMINELP 3.4 12/08/2019   A1GS 0.2 12/08/2019   A2GS 0.6 12/08/2019    BETS 1.0 12/08/2019   GAMS 0.8 12/08/2019   MSPIKE Not Observed 12/08/2019   SPEI Comment 12/08/2019    Lab Results  Component Value Date   KPAFRELGTCHN 32.1 (H) 12/08/2019   LAMBDASER 19.6 12/08/2019   KAPLAMBRATIO 1.64 12/08/2019    DIAGNOSTIC IMAGING:  I have independently reviewed the scans and discussed with the patient.   ASSESSMENT:  1.  IgG lambda multiple myeloma, stage III, del 17 p: -4 cycles of RVD from 11/01/2017 through 01/14/2018. -Stem cell transplant on 02/20/2018 at [facility]. -Maintenance Velcade every 2 weeks and Revlimid 10 mg 3 weeks on/1 week off started on 07/29/2018. -Myeloma panel from 12/08/2018 shows M spike not observed.  Kappa light chains of 32.1 with ratio of 1.64.  Immunofixation was negative.   PLAN:  1.  IgG lambda multiple myeloma, stage III, del 17 p: -I have reviewed her myeloma labs from 12/08/2019 which continues to show remission. -We reviewed her routine labs.    White count is 2.9 with ANC of 1.4.  Platelet count is normal.  Creatinine is minimally elevated at 1.22. -She is trying to lose weight and get knee surgery done. -She will continue with Velcade and Revlimid. -We will send a prescription Velcade to her pharmacy, so that they will supply the medication to be given at our center. -She will come back in 2 months for follow-up.  She has an appointment with [facility] in August.  2.  Hypomagnesemia: -Continue magnesium supplements at home.  Magnesium was normal at 1.9.  3.  Bone strengthening agents: -Continue Zometa 3 mg every 3 months.  4.  Peripheral neuropathy: -Continue Lyrica 200 mg twice daily.  Neuropathy in the feet is stable.  5.  ID prophylaxis: -Continue acyclovir twice daily.  6.  Diabetes: -Continue Metformin and Trulicity.    Orders placed this encounter:  No orders of the defined types were placed in this encounter.     , MD [facility] [phone]   I, Daniel Khashchuk, am acting as a  scribe for Dr.  Katagadda.  I,   MD, have reviewed the above documentation for accuracy and completeness, and I agree with the above.     

## 2019-12-22 NOTE — Patient Instructions (Signed)
[place] at [facility] Discharge Instructions  You were seen today by Dr. Delton Coombes. He went over your recent results. Continue receiving your injections every 2 weeks. Please continue your routine care with your primary care provider. Dr. Delton Coombes will see you back in 2 months for labs and follow up.   Thank you for choosing [place] at [facility] to provide your oncology and hematology care.  To afford each patient quality time with our provider, please arrive at least 15 minutes before your scheduled appointment time.   If you have a lab appointment with the [place] please come in thru the Main Entrance and check in at the main information desk  You need to re-schedule your appointment should you arrive 10 or more minutes late.  We strive to give you quality time with our providers, and arriving late affects you and other patients whose appointments are after yours.  Also, if you no show three or more times for appointments you may be dismissed from the clinic at the providers discretion.     Again, thank you for choosing [facility].  Our hope is that these requests will decrease the amount of time that you wait before being seen by our physicians.       _____________________________________________________________  Should you have questions after your visit to [facility], please contact our office at [phone] between the hours of 8:00 a.m. and 4:30 p.m.  Voicemails left after 4:00 p.m. will not be returned until the following business day.  For prescription refill requests, have your pharmacy contact our office and allow 72 hours.    Cancer Center Support Programs:   > Cancer Support Group  2nd Tuesday of the month 1pm-2pm, Journey Room

## 2019-12-23 ENCOUNTER — Other Ambulatory Visit (HOSPITAL_COMMUNITY)
Admission: RE | Admit: 2019-12-23 | Discharge: 2019-12-23 | Disposition: A | Discharge status: Home / Self Care | Payer: BC Managed Care – PPO | Source: Ambulatory Visit | Attending: Internal Medicine | Admitting: Internal Medicine

## 2019-12-23 ENCOUNTER — Encounter (HOSPITAL_COMMUNITY)
Admission: RE | Admit: 2019-12-23 | Discharge: 2019-12-23 | Disposition: A | Discharge status: Home / Self Care | Payer: BC Managed Care – PPO | Source: Ambulatory Visit | Attending: Internal Medicine | Admitting: Internal Medicine

## 2019-12-23 DIAGNOSIS — Z20822 Contact with and (suspected) exposure to covid-19: Secondary | ICD-10-CM | POA: Insufficient documentation

## 2019-12-23 DIAGNOSIS — Z01812 Encounter for preprocedural laboratory examination: Secondary | ICD-10-CM | POA: Insufficient documentation

## 2019-12-23 MED ORDER — BORTEZOMIB CHEMO SQ INJECTION 3.5 MG (2.5MG/ML): 1.3000 mg/m2 | INTRAMUSCULAR | 12 refills | Status: AC

## 2019-12-24 LAB — SARS CORONAVIRUS 2 (TAT 6-24 HRS): SARS Coronavirus 2: NEGATIVE

## 2019-12-25 ENCOUNTER — Encounter (HOSPITAL_COMMUNITY)
Admission: RE | Disposition: A | Discharge status: Home / Self Care | Payer: Self-pay | Source: Home / Self Care | Attending: Internal Medicine

## 2019-12-25 ENCOUNTER — Other Ambulatory Visit: Payer: Self-pay

## 2019-12-25 ENCOUNTER — Ambulatory Visit (HOSPITAL_COMMUNITY): Payer: BC Managed Care – PPO | Admitting: Certified Registered Nurse Anesthetist

## 2019-12-25 ENCOUNTER — Ambulatory Visit (HOSPITAL_COMMUNITY)
Admission: RE | Admit: 2019-12-25 | Discharge: 2019-12-25 | Disposition: A | Discharge status: Home / Self Care | Payer: BC Managed Care – PPO | Source: Home / Self Care | Attending: Internal Medicine | Admitting: Internal Medicine

## 2019-12-25 ENCOUNTER — Encounter (HOSPITAL_COMMUNITY): Payer: Self-pay | Admitting: Internal Medicine

## 2019-12-25 DIAGNOSIS — Z1211 Encounter for screening for malignant neoplasm of colon: Secondary | ICD-10-CM

## 2019-12-25 DIAGNOSIS — F419 Anxiety disorder, unspecified: Secondary | ICD-10-CM | POA: Insufficient documentation

## 2019-12-25 DIAGNOSIS — Z87442 Personal history of urinary calculi: Secondary | ICD-10-CM | POA: Insufficient documentation

## 2019-12-25 DIAGNOSIS — R519 Headache, unspecified: Secondary | ICD-10-CM | POA: Insufficient documentation

## 2019-12-25 DIAGNOSIS — K644 Residual hemorrhoidal skin tags: Secondary | ICD-10-CM | POA: Insufficient documentation

## 2019-12-25 DIAGNOSIS — Z90710 Acquired absence of both cervix and uterus: Secondary | ICD-10-CM | POA: Insufficient documentation

## 2019-12-25 DIAGNOSIS — D123 Benign neoplasm of transverse colon: Secondary | ICD-10-CM | POA: Insufficient documentation

## 2019-12-25 DIAGNOSIS — Z825 Family history of asthma and other chronic lower respiratory diseases: Secondary | ICD-10-CM | POA: Insufficient documentation

## 2019-12-25 DIAGNOSIS — I10 Essential (primary) hypertension: Secondary | ICD-10-CM | POA: Insufficient documentation

## 2019-12-25 DIAGNOSIS — K219 Gastro-esophageal reflux disease without esophagitis: Secondary | ICD-10-CM | POA: Insufficient documentation

## 2019-12-25 DIAGNOSIS — D649 Anemia, unspecified: Secondary | ICD-10-CM | POA: Insufficient documentation

## 2019-12-25 DIAGNOSIS — Z859 Personal history of malignant neoplasm, unspecified: Secondary | ICD-10-CM | POA: Insufficient documentation

## 2019-12-25 DIAGNOSIS — Z833 Family history of diabetes mellitus: Secondary | ICD-10-CM | POA: Insufficient documentation

## 2019-12-25 DIAGNOSIS — Z79899 Other long term (current) drug therapy: Secondary | ICD-10-CM | POA: Insufficient documentation

## 2019-12-25 DIAGNOSIS — Z809 Family history of malignant neoplasm, unspecified: Secondary | ICD-10-CM | POA: Insufficient documentation

## 2019-12-25 DIAGNOSIS — Z820 Family history of epilepsy and other diseases of the nervous system: Secondary | ICD-10-CM | POA: Insufficient documentation

## 2019-12-25 DIAGNOSIS — Z8261 Family history of arthritis: Secondary | ICD-10-CM | POA: Insufficient documentation

## 2019-12-25 DIAGNOSIS — Z87891 Personal history of nicotine dependence: Secondary | ICD-10-CM | POA: Insufficient documentation

## 2019-12-25 HISTORY — PX: COLONOSCOPY WITH PROPOFOL: SHX5780

## 2019-12-25 HISTORY — PX: POLYPECTOMY: SHX5525

## 2019-12-25 LAB — GLUCOSE, CAPILLARY: Glucose-Capillary: 90 mg/dL (ref 70–99)

## 2019-12-25 SURGERY — COLONOSCOPY WITH PROPOFOL
Anesthesia: General

## 2019-12-25 MED ORDER — KETAMINE HCL 10 MG/ML IJ SOLN
INTRAMUSCULAR | Status: DC | PRN
Start: 2019-12-25 — End: 2019-12-25
  Administered 2019-12-25: 30 mg via INTRAVENOUS

## 2019-12-25 MED ORDER — MIDAZOLAM HCL 5 MG/5ML IJ SOLN
INTRAMUSCULAR | Status: AC
  Filled 2019-12-25: qty 10

## 2019-12-25 MED ORDER — GLYCOPYRROLATE 0.2 MG/ML IJ SOLN
INTRAMUSCULAR | Status: DC | PRN
  Administered 2019-12-25: .1 mg via INTRAVENOUS

## 2019-12-25 MED ORDER — PROPOFOL 10 MG/ML IV BOLUS
INTRAVENOUS | Status: AC
  Filled 2019-12-25: qty 80

## 2019-12-25 MED ORDER — PROPOFOL 10 MG/ML IV BOLUS
INTRAVENOUS | Status: DC | PRN
  Administered 2019-12-25: 20 mg via INTRAVENOUS

## 2019-12-25 MED ORDER — LACTATED RINGERS IV SOLN: INTRAVENOUS | Status: DC | PRN

## 2019-12-25 MED ORDER — PROPOFOL 500 MG/50ML IV EMUL
INTRAVENOUS | Status: DC | PRN
  Administered 2019-12-25: 125 ug/kg/min via INTRAVENOUS

## 2019-12-25 MED ORDER — MEPERIDINE HCL 50 MG/ML IJ SOLN
INTRAMUSCULAR | Status: AC
  Filled 2019-12-25: qty 1

## 2019-12-25 MED ORDER — CHLORHEXIDINE GLUCONATE CLOTH 2 % EX PADS: 6.0000 | MEDICATED_PAD | Freq: Once | CUTANEOUS | Status: DC

## 2019-12-25 MED ORDER — ONDANSETRON HCL 4 MG/2ML IJ SOLN
INTRAMUSCULAR | Status: AC
  Filled 2019-12-25: qty 2

## 2019-12-25 MED ORDER — LACTATED RINGERS IV SOLN: INTRAVENOUS | Status: DC

## 2019-12-25 MED ORDER — EPHEDRINE SULFATE-NACL 50-0.9 MG/10ML-% IV SOSY
PREFILLED_SYRINGE | INTRAVENOUS | Status: DC | PRN
  Administered 2019-12-25 (×2): 5 mg via INTRAVENOUS

## 2019-12-25 MED ORDER — LIDOCAINE HCL (CARDIAC) PF 100 MG/5ML IV SOSY
PREFILLED_SYRINGE | INTRAVENOUS | Status: DC | PRN
  Administered 2019-12-25: 50 mg via INTRATRACHEAL

## 2019-12-25 MED ORDER — KETAMINE HCL 50 MG/5ML IJ SOSY
PREFILLED_SYRINGE | INTRAMUSCULAR | Status: AC
  Filled 2019-12-25: qty 5

## 2019-12-25 NOTE — Anesthesia Postprocedure Evaluation (Signed)
Anesthesia Post Note  Patient: [name]  Procedure(s) Performed: COLONOSCOPY WITH PROPOFOL (N/A ) POLYPECTOMY  Patient location during evaluation: PACU Anesthesia Type: General Level of consciousness: awake and alert Pain management: pain level controlled Vital Signs Assessment: post-procedure vital signs reviewed and stable Respiratory status: spontaneous breathing Cardiovascular status: stable Postop Assessment: no apparent nausea or vomiting Anesthetic complications: no   No complications documented.   Last Vitals:  Vitals:   12/25/19 0646  BP: 123/75  Pulse: 60  Resp: 15  Temp: 36.6 C  SpO2: 97%    Last Pain:  Vitals:   12/25/19 0732  TempSrc:   PainSc: 0-No pain                 Carianna Lague Hristova

## 2019-12-25 NOTE — H&P (Addendum)
Joanna Reid is an 54 y.o. female.   Chief Complaint: Patient is here for colonoscopy. HPI: Patient is 54 year old Caucasian female who is here for screening colonoscopy.  This is patient's first exam.  She denies abdominal pain change in bowel habits or rectal bleeding. She is on low-dose aspirin.  Last dose was 2 days ago. Family history is negative for CRC. Patient has history of multiple myeloma.  She had stem cell transplant in 2019.  She is now on maintenance therapy with Velcade which she gets every 2 weeks.  She has not experienced any side effects with this medication.  Last dose was 3 days ago. CBC from 12/22/2019 revealed WBC of 2.9 ANC of 1400 H&H of 11.4 and 35.6 and platelet count was 150 3K.  Past Medical History:  Diagnosis Date  . Acid reflux   . Allergic rhinitis   . Cancer ([place])    multiple myeloma  . Diabetes mellitus    type 2  . Gout   . Gout   . HBP (high blood pressure)   . History of kidney stones   . Migraines     Past Surgical History:  Procedure Laterality Date  . BREAST CYST EXCISION Left    2009 no visible scar on skin  . CESAREAN SECTION    . EXTRACORPOREAL SHOCK WAVE LITHOTRIPSY Left 10/10/2017   Procedure: LEFT EXTRACORPOREAL SHOCK WAVE LITHOTRIPSY (ESWL);  Surgeon: Bjorn Loser, MD;  Location: [facility];  Service: Urology;  Laterality: Left;  . EYE SURGERY    . HEMORRHOID SURGERY N/A 11/19/2012   Procedure: HEMORRHOIDECTOMY;  Surgeon: Jamesetta So, MD;  Location: [facility];  Service: General;  Laterality: N/A;  . kidney stones  1998  . LAPAROSCOPIC UNILATERAL SALPINGO OOPHERECTOMY  05/14/2012   Procedure: LAPAROSCOPIC UNILATERAL SALPINGO OOPHORECTOMY;  Surgeon: [name], MD;  Location: [facility];  Service: Gynecology;  Laterality: Right;  laparoscopic right salpingo-oophorectomy  . PARTIAL HYSTERECTOMY    . TONSILECTOMY, ADENOIDECTOMY, BILATERAL MYRINGOTOMY AND TUBES    . VESICOVAGINAL FISTULA CLOSURE W/ TAH      Family History  Problem  Relation Age of Onset  . Arthritis Other   . Cancer Other   . Diabetes Other   . Hypertension Mother   . Dementia Mother   . Diabetes Father   . ALS Father   . Diabetes Brother   . Hypertension Brother   . Cancer Paternal Aunt   . COPD Maternal Grandmother   . Cancer Maternal Grandfather   . Anesthesia problems Paternal Grandfather    Social History:  reports that she quit smoking about 20 years ago. She has never used smokeless tobacco. She reports that she does not drink alcohol and does not use drugs.  Allergies: No Known Allergies  Medications Prior to Admission  Medication Sig Dispense Refill  . acyclovir (ZOVIRAX) 400 MG tablet TAKE (1) TABLET BY MOUTH TWICE DAILY. 60 tablet 6  . allopurinol (ZYLOPRIM) 300 MG tablet TAKE 1 TABLET DAILY (Patient taking differently: Take 300 mg by mouth daily. ) 90 tablet 1  . aspirin EC 81 MG tablet Take 81 mg by mouth daily.    . Bortezomib (VELCADE IJ) Inject as directed every 14 (fourteen) days.     . bumetanide (BUMEX) 0.5 MG tablet Take 0.5 mg by mouth daily.     . carvedilol (COREG) 25 MG tablet Take 25 mg by mouth 2 (two) times daily.    Marland Kitchen docusate sodium (COLACE) 100 MG capsule Take 100  mg by mouth daily.     Marland Kitchen lenalidomide (REVLIMID) 10 MG capsule TAKE 1 CAPSULE BY MOUTH  DAILY FOR 21 DAYS ON, THEN  7 DAYS OFF 21 capsule 1  . loratadine (CLARITIN) 10 MG tablet Take 10 mg by mouth daily.    Marland Kitchen losartan (COZAAR) 25 MG tablet Take 25 mg by mouth in the morning and at bedtime.     . Magnesium 200 MG TABS Take 600 mg by mouth daily.    . metFORMIN (GLUCOPHAGE) 500 MG tablet TAKE 1 TABLET BY MOUTH ONCE DAILY WITH BREAKFAST (Patient taking differently: Take 1,000 mg by mouth 2 (two) times daily with a meal. ) 90 tablet 0  . omeprazole (PRILOSEC) 20 MG capsule Take 1 capsule (20 mg total) by mouth daily. 90 capsule 1  . potassium chloride SA (KLOR-CON) 20 MEQ tablet TAKE (1) TABLET BY MOUTH TWICE DAILY. (Patient taking differently: Take 20 mEq  by mouth daily. ) 60 tablet 5  . pregabalin (LYRICA) 200 MG capsule TAKE (1) CAPSULE BY MOUTH TWICE DAILY. (Patient taking differently: Take 200 mg by mouth daily. ) 60 capsule 4  . rosuvastatin (CRESTOR) 5 MG tablet Take 5 mg by mouth daily.     . TRULICITY 1.5 SW/5.4OE SOPN Inject 0.75 mg into the skin every Monday.     Marland Kitchen albuterol (PROVENTIL HFA;VENTOLIN HFA) 108 (90 Base) MCG/ACT inhaler Inhale 2 puffs into the lungs every 4 (four) hours as needed for wheezing or shortness of breath. (Patient not taking: Reported on 12/22/2019) 1 Inhaler 3  . bortezomib SQ (VELCADE) 3.5 MG SOLR Inject 1.2 mLs (3 mg total) into the skin every 14 (fourteen) days for 6 doses. 7.2 mL 12  . cetirizine (ZYRTEC) 10 MG tablet Take 1 tablet (10 mg total) by mouth daily. 30 tablet 0  . diazepam (VALIUM) 5 MG tablet Take 1 tablet (5 mg total) by mouth at bedtime. 30 tablet 5  . fluticasone (FLONASE) 50 MCG/ACT nasal spray Place 2 sprays into both nostrils daily. 16 g 0  . HYDROcodone-acetaminophen (NORCO/VICODIN) 5-325 MG tablet Take 1 tablet by mouth at bedtime as needed for moderate pain. 30 tablet 0  . magnesium oxide (MAG-OX) 400 (241.3 Mg) MG tablet Take 1 tablet (400 mg total) by mouth 3 (three) times daily. 90 tablet 3  . ondansetron (ZOFRAN) 8 MG tablet Take 1 tablet (8 mg total) by mouth every 8 (eight) hours as needed. (Patient not taking: Reported on 12/22/2019) 30 tablet 3  . prochlorperazine (COMPAZINE) 10 MG tablet Take 1 tablet (10 mg total) by mouth every 6 (six) hours as needed for nausea or vomiting. (Patient not taking: Reported on 12/22/2019) 30 tablet 2  . valACYclovir (VALTREX) 1000 MG tablet TAKE 2 TABLETS NOW, THEN 2 TABLETS 12 HOURS LATER. 12 tablet 0    Results for orders placed or performed during the hospital encounter of 12/23/19 (from the past 48 hour(s))  SARS CORONAVIRUS 2 (TAT 6-24 HRS) Nasopharyngeal Nasopharyngeal Swab     Status: None   Collection Time: 12/23/19  1:06 PM   Specimen:  Nasopharyngeal Swab  Result Value Ref Range   SARS Coronavirus 2 NEGATIVE NEGATIVE    Comment: (NOTE) SARS-CoV-2 target nucleic acids are NOT DETECTED.  The SARS-CoV-2 RNA is generally detectable in upper and lower respiratory specimens during the acute phase of infection. Negative results do not preclude SARS-CoV-2 infection, do not rule out co-infections with other pathogens, and should not be used as the sole basis for treatment or  other patient management decisions. Negative results must be combined with clinical observations, patient history, and epidemiological information. The expected result is Negative.  Fact Sheet for Patients: SugarRoll.be  Fact Sheet for Healthcare Providers: https://www.woods-mathews.com/  This test is not yet approved or cleared by the [place] FDA and  has been authorized for detection and/or diagnosis of SARS-CoV-2 by FDA under an Emergency Use Authorization (EUA). This EUA will remain  in effect (meaning this test can be used) for the duration of the COVID-19 declaration under Se ction 564(b)(1) of the Act, 21 U.S.C. section 360bbb-3(b)(1), unless the authorization is terminated or revoked sooner.  Performed at [facility], [place] [address], [place], [place] [address]    No results found.  Review of Systems  Blood pressure 123/75, pulse 60, temperature 97.8 F (36.6 C), temperature source Oral, resp. rate 15, height 5' 4" (1.626 m), weight 111.5 kg, SpO2 97 %. Physical Exam  HENT:  Mouth/Throat: Mucous membranes are moist. Oropharynx is clear.  Eyes: Conjunctivae are normal. No scleral icterus.  Cardiovascular: Normal rate, regular rhythm and normal heart sounds.  No murmur heard. Respiratory: Effort normal and breath sounds normal.  GI:  Abdomen is obese.  She has erythema in right upper quadrant site of Velcade injection.  Abdomen is soft and nontender with organomegaly or masses.   Musculoskeletal:     Cervical back: Neck supple.     Comments: Nonpitting bilateral pretibial edema  Lymphadenopathy:    She has no cervical adenopathy.  Neurological: She is alert.  Psychiatric: Her behavior is normal. Mood normal.     Assessment/Plan  Average risk screening colonoscopy.  Hildred Laser, MD 12/25/2019, 7:19 AM

## 2019-12-25 NOTE — Op Note (Signed)
[facility] Patient Name: Hanley Rispoli Procedure Date: 12/25/2019 6:49 AM MRN: [id number] Date of Birth: Dec 02, 1965 Attending MD: Hildred Laser , MD CSN: [id number] Age: 54 Admit Type: Outpatient Procedure:                Colonoscopy Indications:              Screening for colorectal malignant neoplasm Providers:                Hildred Laser, MD, Otis Peak B. Sharon Seller, RN, Raphael Gibney, Technician Referring MD:             Grace Bushy. Luking, MD Medicines:                Propofol per Anesthesia Complications:            No immediate complications. Estimated Blood Loss:     Estimated blood loss was minimal. Procedure:                Pre-Anesthesia Assessment:                           - Prior to the procedure, a History and Physical                            was performed, and patient medications and                            allergies were reviewed. The patient's tolerance of                            previous anesthesia was also reviewed. The risks                            and benefits of the procedure and the sedation                            options and risks were discussed with the patient.                            All questions were answered, and informed consent                            was obtained. Prior Anticoagulants: The patient has                            taken no previous anticoagulant or antiplatelet                            agents except for aspirin. ASA Grade Assessment:                            III - A patient with severe systemic disease. After  reviewing the risks and benefits, the patient was                            deemed in satisfactory condition to undergo the                            procedure.                           After obtaining informed consent, the colonoscope                            was passed under direct vision. Throughout the                            procedure,  the patient's blood pressure, pulse, and                            oxygen saturations were monitored continuously. The                            [id number]-[id number] ([id number]) scope was introduced through                            the anus and advanced to the the cecum, identified                            by appendiceal orifice and ileocecal valve. The                            colonoscopy was somewhat difficult due to a                            redundant colon and a tortuous colon. The patient                            tolerated the procedure well. The quality of the                            bowel preparation was adequate. The ileocecal valve                            and the rectum were photographed. Scope In: 7:36:29 AM Scope Out: 8:00:34 AM Scope Withdrawal Time: 0 hours 8 minutes 24 seconds  Total Procedure Duration: 0 hours 24 minutes 5 seconds  Findings:      The perianal and digital rectal examinations were normal.      A 6 mm polyp was found in the hepatic flexure. The polyp was removed       with a cold snare. Resection and retrieval were complete.      The exam was otherwise normal throughout the examined colon.      External hemorrhoids were found during retroflexion. The hemorrhoids       were small. Impression:               -  One 6 mm polyp at the hepatic flexure, removed                            with a cold snare. Resected and retrieved.                           - External hemorrhoids. Moderate Sedation:      Per Anesthesia Care Recommendation:           - Patient has a contact number available for                            emergencies. The signs and symptoms of potential                            delayed complications were discussed with the                            patient. Return to normal activities tomorrow.                            Written discharge instructions were provided to the                            patient.                           - Resume  previous diet today.                           - Continue present medications.                           - No aspirin, ibuprofen, naproxen, or other                            non-steroidal anti-inflammatory drugs for 1 day.                           - Await pathology results.                           - Repeat colonoscopy is recommended. The                            colonoscopy date will be determined after pathology                            results from today's exam become available for                            review. Procedure Code(s):        --- Professional ---                           [phone], Colonoscopy, flexible; with removal of  tumor(s), polyp(s), or other lesion(s) by snare                            technique Diagnosis Code(s):        --- Professional ---                           Z12.11, Encounter for screening for malignant                            neoplasm of colon                           K63.5, Polyp of colon                           K64.4, Residual hemorrhoidal skin tags CPT copyright 2019 [employer]. All rights reserved. The codes documented in this report are preliminary and upon coder review may  be revised to meet current compliance requirements. Hildred Laser, MD Hildred Laser, MD 12/25/2019 8:08:34 AM This report has been signed electronically. Number of Addenda: 0

## 2019-12-25 NOTE — Discharge Instructions (Signed)
Monitored Anesthesia Care, Care After These instructions provide you with information about caring for yourself after your procedure. Your health care provider may also give you more specific instructions. Your treatment has been planned according to current medical practices, but problems sometimes occur. Call your health care provider if you have any problems or questions after your procedure. What can I expect after the procedure? After your procedure, you may:  Feel sleepy for several hours.  Feel clumsy and have poor balance for several hours.  Feel forgetful about what happened after the procedure.  Have poor judgment for several hours.  Feel nauseous or vomit.  Have a sore throat if you had a breathing tube during the procedure. Follow these instructions at home: For at least 24 hours after the procedure:      Have a responsible adult stay with you. It is important to have someone help care for you until you are awake and alert.  Rest as needed.  Do not: ? Participate in activities in which you could fall or become injured. ? Drive. ? Use heavy machinery. ? Drink alcohol. ? Take sleeping pills or medicines that cause drowsiness. ? Make important decisions or sign legal documents. ? Take care of children on your own. Eating and drinking  Follow the diet that is recommended by your health care provider.  If you vomit, drink water, juice, or soup when you can drink without vomiting.  Make sure you have little or no nausea before eating solid foods. General instructions  Take over-the-counter and prescription medicines only as told by your health care provider.  If you have sleep apnea, surgery and certain medicines can increase your risk for breathing problems. Follow instructions from your health care provider about wearing your sleep device: ? Anytime you are sleeping, including during daytime naps. ? While taking prescription pain medicines, sleeping medicines,  or medicines that make you drowsy.  If you smoke, do not smoke without supervision.  Keep all follow-up visits as told by your health care provider. This is important. Contact a health care provider if:  You keep feeling nauseous or you keep vomiting.  You feel light-headed.  You develop a rash.  You have a fever. Get help right away if:  You have trouble breathing. Summary  For several hours after your procedure, you may feel sleepy and have poor judgment.  Have a responsible adult stay with you for at least 24 hours or until you are awake and alert. This information is not intended to replace advice given to you by your health care provider. Make sure you discuss any questions you have with your health care provider. Document Revised: 09/23/2017 Document Reviewed: 10/16/2015 Elsevier Patient Education  2020 [employer].  Hemorrhoids Hemorrhoids are swollen veins that may develop:  In the butt (rectum). These are called internal hemorrhoids.  Around the opening of the butt (anus). These are called external hemorrhoids. Hemorrhoids can cause pain, itching, or bleeding. Most of the time, they do not cause serious problems. They usually get better with diet changes, lifestyle changes, and other home treatments. What are the causes? This condition may be caused by:  Having trouble pooping (constipation).  Pushing hard (straining) to poop.  Watery poop (diarrhea).  Pregnancy.  Being very overweight (obese).  Sitting for long periods of time.  Heavy lifting or other activity that causes you to strain.  Anal sex.  Riding a bike for a long period of time. What are the signs or symptoms? Symptoms  of this condition include:  Pain.  Itching or soreness in the butt.  Bleeding from the butt.  Leaking poop.  Swelling in the area.  One or more lumps around the opening of your butt. How is this diagnosed? A doctor can often diagnose this condition by looking at  the affected area. The doctor may also:  Do an exam that involves feeling the area with a gloved hand (digital rectal exam).  Examine the area inside your butt using a small tube (anoscope).  Order blood tests. This may be done if you have lost a lot of blood.  Have you get a test that involves looking inside the colon using a flexible tube with a camera on the end (sigmoidoscopy or colonoscopy). How is this treated? This condition can usually be treated at home. Your doctor may tell you to change what you eat, make lifestyle changes, or try home treatments. If these do not help, procedures can be done to remove the hemorrhoids or make them smaller. These may involve:  Placing rubber bands at the base of the hemorrhoids to cut off their blood supply.  Injecting medicine into the hemorrhoids to shrink them.  Shining a type of light energy onto the hemorrhoids to cause them to fall off.  Doing surgery to remove the hemorrhoids or cut off their blood supply. Follow these instructions at home: Eating and drinking   Eat foods that have a lot of fiber in them. These include whole grains, beans, nuts, fruits, and vegetables.  Ask your doctor about taking products that have added fiber (fibersupplements).  Reduce the amount of fat in your diet. You can do this by: ? Eating low-fat dairy products. ? Eating less red meat. ? Avoiding processed foods.  Drink enough fluid to keep your pee (urine) pale yellow. Managing pain and swelling   Take a warm-water bath (sitz bath) for 20 minutes to ease pain. Do this 3-4 times a day. You may do this in a bathtub or using a portable sitz bath that fits over the toilet.  If told, put ice on the painful area. It may be helpful to use ice between your warm baths. ? Put ice in a plastic bag. ? Place a towel between your skin and the bag. ? Leave the ice on for 20 minutes, 2-3 times a day. General instructions  Take over-the-counter and prescription  medicines only as told by your doctor. ? Medicated creams and medicines may be used as told.  Exercise often. Ask your doctor how much and what kind of exercise is best for you.  Go to the bathroom when you have the urge to poop. Do not wait.  Avoid pushing too hard when you poop.  Keep your butt dry and clean. Use wet toilet paper or moist towelettes after pooping.  Do not sit on the toilet for a long time.  Keep all follow-up visits as told by your doctor. This is important. Contact a doctor if you:  Have pain and swelling that do not get better with treatment or medicine.  Have trouble pooping.  Cannot poop.  Have pain or swelling outside the area of the hemorrhoids. Get help right away if you have:  Bleeding that will not stop. Summary  Hemorrhoids are swollen veins in the butt or around the opening of the butt.  They can cause pain, itching, or bleeding.  Eat foods that have a lot of fiber in them. These include whole grains, beans, nuts, fruits, and  vegetables.  Take a warm-water bath (sitz bath) for 20 minutes to ease pain. Do this 3-4 times a day. This information is not intended to replace advice given to you by your health care provider. Make sure you discuss any questions you have with your health care provider. Document Revised: 07/03/2018 Document Reviewed: 11/14/2017 Elsevier Patient Education  [employer].   Colon Polyps  Polyps are tissue growths inside the body. Polyps can grow in many places, including the large intestine (colon). A polyp may be a round bump or a mushroom-shaped growth. You could have one polyp or several. Most colon polyps are noncancerous (benign). However, some colon polyps can become cancerous over time. Finding and removing the polyps early can help prevent this. What are the causes? The exact cause of colon polyps is not known. What increases the risk? You are more likely to develop this condition if you:  Have a family  history of colon cancer or colon polyps.  Are older than 50 or older than 45 if you are African American.  Have inflammatory bowel disease, such as ulcerative colitis or Crohn's disease.  Have certain hereditary conditions, such as: ? Familial adenomatous polyposis. ? Lynch syndrome. ? Turcot syndrome. ? Peutz-Jeghers syndrome.  Are overweight.  Smoke cigarettes.  Do not get enough exercise.  Drink too much alcohol.  Eat a diet that is high in fat and red meat and low in fiber.  Had childhood cancer that was treated with abdominal radiation. What are the signs or symptoms? Most polyps do not cause symptoms. If you have symptoms, they may include:  Blood coming from your rectum when having a bowel movement.  Blood in your stool. The stool may look dark red or black.  Abdominal pain.  A change in bowel habits, such as constipation or diarrhea. How is this diagnosed? This condition is diagnosed with a colonoscopy. This is a procedure in which a lighted, flexible scope is inserted into the anus and then passed into the colon to examine the area. Polyps are sometimes found when a colonoscopy is done as part of routine cancer screening tests. How is this treated? Treatment for this condition involves removing any polyps that are found. Most polyps can be removed during a colonoscopy. Those polyps will then be tested for cancer. Additional treatment may be needed depending on the results of testing. Follow these instructions at home: Lifestyle  Maintain a healthy weight, or lose weight if recommended by your health care provider.  Exercise every day or as told by your health care provider.  Do not use any products that contain nicotine or tobacco, such as cigarettes and e-cigarettes. If you need help quitting, ask your health care provider.  If you drink alcohol, limit how much you have: ? 0-1 drink a day for women. ? 0-2 drinks a day for men.  Be aware of how much alcohol  is in your drink. In the [place] one drink equals one 12 oz bottle of beer (355 mL), one 5 oz glass of wine (148 mL), or one 1 oz shot of hard liquor (44 mL). Eating and drinking   Eat foods that are high in fiber, such as fruits, vegetables, and whole grains.  Eat foods that are high in calcium and vitamin D, such as milk, cheese, yogurt, eggs, liver, fish, and broccoli.  Limit foods that are high in fat, such as fried foods and desserts.  Limit the amount of red meat and processed meat you eat,  such as hot dogs, sausage, bacon, and lunch meats. General instructions  Keep all follow-up visits as told by your health care provider. This is important. ? This includes having regularly scheduled colonoscopies. ? Talk to your health care provider about when you need a colonoscopy. Contact a health care provider if:  You have new or worsening bleeding during a bowel movement.  You have new or increased blood in your stool.  You have a change in bowel habits.  You lose weight for no known reason. Summary  Polyps are tissue growths inside the body. Polyps can grow in many places, including the colon.  Most colon polyps are noncancerous (benign), but some can become cancerous over time.  This condition is diagnosed with a colonoscopy.  Treatment for this condition involves removing any polyps that are found. Most polyps can be removed during a colonoscopy. This information is not intended to replace advice given to you by your health care provider. Make sure you discuss any questions you have with your health care provider. Document Revised: 10/10/2017 Document Reviewed: 10/10/2017 Elsevier Patient Education  [place] aspirin on 12/26/2019 Resume other medications and diet as before. No driving for 24 hours. Physician will call with biopsy results next week.

## 2019-12-25 NOTE — Anesthesia Preprocedure Evaluation (Signed)
Anesthesia Evaluation  Patient identified by MRN, date of birth, ID band Patient awake    Reviewed: Allergy & Precautions, H&P , NPO status , Patient's Chart, lab work & pertinent test results, reviewed documented beta blocker date and time   Airway Mallampati: II  TM Distance: >3 FB Neck ROM: full    Dental no notable dental hx.    Pulmonary neg pulmonary ROS, former smoker,    Pulmonary exam normal breath sounds clear to auscultation       Cardiovascular Exercise Tolerance: Good hypertension, negative cardio ROS   Rhythm:regular Rate:Normal     Neuro/Psych  Headaches, PSYCHIATRIC DISORDERS Anxiety    GI/Hepatic Neg liver ROS, GERD  Medicated,  Endo/Other  negative endocrine ROSdiabetes  Renal/GU negative Renal ROS  negative genitourinary   Musculoskeletal negative musculoskeletal ROS (+)   Abdominal   Peds negative pediatric ROS (+)  Hematology  (+) Blood dyscrasia, anemia ,   Anesthesia Other Findings   Reproductive/Obstetrics negative OB ROS                             Anesthesia Physical Anesthesia Plan  ASA: III  Anesthesia Plan: General   Post-op Pain Management:    Induction:   PONV Risk Score and Plan: Propofol infusion  Airway Management Planned:   Additional Equipment:   Intra-op Plan:   Post-operative Plan:   Informed Consent: I have reviewed the patients History and Physical, chart, labs and discussed the procedure including the risks, benefits and alternatives for the proposed anesthesia with the patient or authorized representative who has indicated his/her understanding and acceptance.     Dental Advisory Given  Plan Discussed with: CRNA  Anesthesia Plan Comments:         Anesthesia Quick Evaluation

## 2019-12-25 NOTE — Transfer of Care (Signed)
Immediate Anesthesia Transfer of Care Note  Patient: Joanna Reid  Procedure(s) Performed: COLONOSCOPY WITH PROPOFOL (N/A ) POLYPECTOMY  Patient Location: PACU  Anesthesia Type:General  Level of Consciousness: awake, alert  and oriented  Airway & Oxygen Therapy: Patient Spontanous Breathing  Post-op Assessment: Report given to RN and Post -op Vital signs reviewed and stable  Post vital signs: Reviewed and stable  Last Vitals:  Vitals Value Taken Time  BP 104/68 12/25/19 0807  Temp    Pulse 71 12/25/19 0808  Resp 15 12/25/19 0808  SpO2 95 % 12/25/19 0808  Vitals shown include unvalidated device data.  Last Pain:  Vitals:   12/25/19 0732  TempSrc:   PainSc: 0-No pain         Complications: No complications documented.

## 2019-12-26 ENCOUNTER — Other Ambulatory Visit: Payer: Self-pay

## 2019-12-26 ENCOUNTER — Emergency Department (HOSPITAL_COMMUNITY): Payer: BC Managed Care – PPO

## 2019-12-26 ENCOUNTER — Encounter (HOSPITAL_COMMUNITY): Payer: Self-pay | Admitting: Emergency Medicine

## 2019-12-26 ENCOUNTER — Inpatient Hospital Stay (HOSPITAL_COMMUNITY): Payer: BC Managed Care – PPO

## 2019-12-26 ENCOUNTER — Inpatient Hospital Stay (HOSPITAL_COMMUNITY)
Admission: AD | Admit: 2019-12-26 | Discharge: 2019-12-28 | DRG: 065 | Disposition: A | Discharge status: Home / Self Care | Payer: BC Managed Care – PPO | Attending: Internal Medicine | Admitting: Internal Medicine

## 2019-12-26 DIAGNOSIS — U071 COVID-19: Secondary | ICD-10-CM | POA: Diagnosis present

## 2019-12-26 DIAGNOSIS — K635 Polyp of colon: Secondary | ICD-10-CM | POA: Diagnosis present

## 2019-12-26 DIAGNOSIS — Z9484 Stem cells transplant status: Secondary | ICD-10-CM

## 2019-12-26 DIAGNOSIS — D649 Anemia, unspecified: Secondary | ICD-10-CM | POA: Diagnosis present

## 2019-12-26 DIAGNOSIS — Z8249 Family history of ischemic heart disease and other diseases of the circulatory system: Secondary | ICD-10-CM | POA: Diagnosis not present

## 2019-12-26 DIAGNOSIS — K219 Gastro-esophageal reflux disease without esophagitis: Secondary | ICD-10-CM | POA: Diagnosis present

## 2019-12-26 DIAGNOSIS — Z825 Family history of asthma and other chronic lower respiratory diseases: Secondary | ICD-10-CM

## 2019-12-26 DIAGNOSIS — R29704 NIHSS score 4: Secondary | ICD-10-CM | POA: Diagnosis present

## 2019-12-26 DIAGNOSIS — R471 Dysarthria and anarthria: Secondary | ICD-10-CM | POA: Diagnosis present

## 2019-12-26 DIAGNOSIS — E785 Hyperlipidemia, unspecified: Secondary | ICD-10-CM | POA: Diagnosis present

## 2019-12-26 DIAGNOSIS — Z20822 Contact with and (suspected) exposure to covid-19: Secondary | ICD-10-CM | POA: Diagnosis present

## 2019-12-26 DIAGNOSIS — Z6841 Body Mass Index (BMI) 40.0 and over, adult: Secondary | ICD-10-CM | POA: Diagnosis not present

## 2019-12-26 DIAGNOSIS — E1169 Type 2 diabetes mellitus with other specified complication: Secondary | ICD-10-CM

## 2019-12-26 DIAGNOSIS — N390 Urinary tract infection, site not specified: Secondary | ICD-10-CM | POA: Diagnosis present

## 2019-12-26 DIAGNOSIS — E1151 Type 2 diabetes mellitus with diabetic peripheral angiopathy without gangrene: Secondary | ICD-10-CM | POA: Diagnosis present

## 2019-12-26 DIAGNOSIS — C9000 Multiple myeloma not having achieved remission: Secondary | ICD-10-CM | POA: Diagnosis present

## 2019-12-26 DIAGNOSIS — Z8616 Personal history of COVID-19: Secondary | ICD-10-CM

## 2019-12-26 DIAGNOSIS — F902 Attention-deficit hyperactivity disorder, combined type: Secondary | ICD-10-CM | POA: Diagnosis present

## 2019-12-26 DIAGNOSIS — Z809 Family history of malignant neoplasm, unspecified: Secondary | ICD-10-CM

## 2019-12-26 DIAGNOSIS — E876 Hypokalemia: Secondary | ICD-10-CM | POA: Diagnosis not present

## 2019-12-26 DIAGNOSIS — E119 Type 2 diabetes mellitus without complications: Secondary | ICD-10-CM

## 2019-12-26 DIAGNOSIS — I6389 Other cerebral infarction: Secondary | ICD-10-CM

## 2019-12-26 DIAGNOSIS — I639 Cerebral infarction, unspecified: Secondary | ICD-10-CM | POA: Diagnosis not present

## 2019-12-26 DIAGNOSIS — C9001 Multiple myeloma in remission: Secondary | ICD-10-CM | POA: Diagnosis present

## 2019-12-26 DIAGNOSIS — K644 Residual hemorrhoidal skin tags: Secondary | ICD-10-CM | POA: Diagnosis present

## 2019-12-26 DIAGNOSIS — Z79899 Other long term (current) drug therapy: Secondary | ICD-10-CM

## 2019-12-26 DIAGNOSIS — R4701 Aphasia: Secondary | ICD-10-CM | POA: Diagnosis present

## 2019-12-26 DIAGNOSIS — Z794 Long term (current) use of insulin: Secondary | ICD-10-CM

## 2019-12-26 DIAGNOSIS — G43909 Migraine, unspecified, not intractable, without status migrainosus: Secondary | ICD-10-CM | POA: Diagnosis present

## 2019-12-26 DIAGNOSIS — M109 Gout, unspecified: Secondary | ICD-10-CM | POA: Diagnosis present

## 2019-12-26 DIAGNOSIS — I10 Essential (primary) hypertension: Secondary | ICD-10-CM | POA: Diagnosis present

## 2019-12-26 DIAGNOSIS — D61818 Other pancytopenia: Secondary | ICD-10-CM | POA: Diagnosis present

## 2019-12-26 DIAGNOSIS — Z87891 Personal history of nicotine dependence: Secondary | ICD-10-CM

## 2019-12-26 DIAGNOSIS — I63412 Cerebral infarction due to embolism of left middle cerebral artery: Principal | ICD-10-CM | POA: Diagnosis present

## 2019-12-26 DIAGNOSIS — Z7982 Long term (current) use of aspirin: Secondary | ICD-10-CM

## 2019-12-26 DIAGNOSIS — Z833 Family history of diabetes mellitus: Secondary | ICD-10-CM

## 2019-12-26 HISTORY — DX: COVID-19: U07.1

## 2019-12-26 LAB — COMPREHENSIVE METABOLIC PANEL
ALT: 26 U/L (ref 0–44)
AST: 29 U/L (ref 15–41)
Albumin: 4 g/dL (ref 3.5–5.0)
Alkaline Phosphatase: 65 U/L (ref 38–126)
Anion gap: 8 (ref 5–15)
BUN: 16 mg/dL (ref 6–20)
CO2: 25 mmol/L (ref 22–32)
Calcium: 8.8 mg/dL — ABNORMAL LOW (ref 8.9–10.3)
Chloride: 106 mmol/L (ref 98–111)
Creatinine, Ser: 1.04 mg/dL — ABNORMAL HIGH (ref 0.44–1.00)
GFR calc Af Amer: >60 mL/min (ref >60)
GFR calc non Af Amer: >60 mL/min (ref >60)
Glucose, Bld: 103 mg/dL — ABNORMAL HIGH (ref 70–99)
Potassium: 3.8 mmol/L (ref 3.5–5.1)
Sodium: 139 mmol/L (ref 135–145)
Total Bilirubin: 0.6 mg/dL (ref 0.3–1.2)
Total Protein: 7 g/dL (ref 6.5–8.1)

## 2019-12-26 LAB — DIFFERENTIAL
Abs Immature Granulocytes: 0.01 10*3/uL (ref 0.00–0.07)
Basophils Absolute: 0 10*3/uL (ref 0.0–0.1)
Basophils Relative: 1 %
Eosinophils Absolute: 0.1 10*3/uL (ref 0.0–0.5)
Eosinophils Relative: 4 %
Immature Granulocytes: 0 %
Lymphocytes Relative: 38 %
Lymphs Abs: 0.9 10*3/uL (ref 0.7–4.0)
Monocytes Absolute: 0.4 10*3/uL (ref 0.1–1.0)
Monocytes Relative: 17 %
Neutro Abs: 1 10*3/uL — ABNORMAL LOW (ref 1.7–7.7)
Neutrophils Relative %: 40 %

## 2019-12-26 LAB — CBC
HCT: 34.5 % — ABNORMAL LOW (ref 36.0–46.0)
Hemoglobin: 11.1 g/dL — ABNORMAL LOW (ref 12.0–15.0)
MCH: 30.7 pg (ref 26.0–34.0)
MCHC: 32.2 g/dL (ref 30.0–36.0)
MCV: 95.6 fL (ref 80.0–100.0)
Platelets: 104 10*3/uL — ABNORMAL LOW (ref 150–400)
RBC: 3.61 MIL/uL — ABNORMAL LOW (ref 3.87–5.11)
RDW: 15.1 % (ref 11.5–15.5)
WBC: 2.4 10*3/uL — ABNORMAL LOW (ref 4.0–10.5)
nRBC: 0 % (ref 0.0–0.2)

## 2019-12-26 LAB — URINALYSIS, ROUTINE W REFLEX MICROSCOPIC
Bilirubin Urine: NEGATIVE
Glucose, UA: NEGATIVE mg/dL
Hgb urine dipstick: NEGATIVE
Ketones, ur: NEGATIVE mg/dL
Leukocytes,Ua: NEGATIVE
Nitrite: POSITIVE — AB
Protein, ur: NEGATIVE mg/dL
Specific Gravity, Urine: >1.046 — ABNORMAL HIGH (ref 1.005–1.030)
pH: 5 (ref 5.0–8.0)

## 2019-12-26 LAB — I-STAT CHEM 8, ED
BUN: 14 mg/dL (ref 6–20)
Calcium, Ion: 1.26 mmol/L (ref 1.15–1.40)
Chloride: 105 mmol/L (ref 98–111)
Creatinine, Ser: 1 mg/dL (ref 0.44–1.00)
Glucose, Bld: 103 mg/dL — ABNORMAL HIGH (ref 70–99)
HCT: 34 % — ABNORMAL LOW (ref 36.0–46.0)
Hemoglobin: 11.6 g/dL — ABNORMAL LOW (ref 12.0–15.0)
Potassium: 3.9 mmol/L (ref 3.5–5.1)
Sodium: 143 mmol/L (ref 135–145)
TCO2: 25 mmol/L (ref 22–32)

## 2019-12-26 LAB — PROTIME-INR
INR: 1 (ref 0.8–1.2)
Prothrombin Time: 12.9 seconds (ref 11.4–15.2)

## 2019-12-26 LAB — ECHOCARDIOGRAM COMPLETE
Height: 64 in
Weight: 4052.94 oz

## 2019-12-26 LAB — POC URINE PREG, ED: Preg Test, Ur: NEGATIVE

## 2019-12-26 LAB — RAPID URINE DRUG SCREEN, HOSP PERFORMED
Amphetamines: NOT DETECTED
Barbiturates: NOT DETECTED
Benzodiazepines: POSITIVE — AB
Cocaine: NOT DETECTED
Opiates: NOT DETECTED
Tetrahydrocannabinol: NOT DETECTED

## 2019-12-26 LAB — APTT: aPTT: 27 seconds (ref 24–36)

## 2019-12-26 LAB — CBG MONITORING, ED
Glucose-Capillary: 101 mg/dL — ABNORMAL HIGH (ref 70–99)
Glucose-Capillary: 78 mg/dL (ref 70–99)

## 2019-12-26 LAB — ETHANOL: Alcohol, Ethyl (B): <10 mg/dL (ref <10)

## 2019-12-26 LAB — SARS CORONAVIRUS 2 BY RT PCR (HOSPITAL ORDER, PERFORMED IN ~~LOC~~ HOSPITAL LAB): SARS Coronavirus 2: NEGATIVE

## 2019-12-26 MED ORDER — INSULIN ASPART 100 UNIT/ML ~~LOC~~ SOLN: 0.0000 [IU] | Freq: Three times a day (TID) | SUBCUTANEOUS | Status: DC

## 2019-12-26 MED ORDER — SODIUM CHLORIDE 0.9 % IV SOLN: INTRAVENOUS | Status: DC

## 2019-12-26 MED ORDER — ASPIRIN 81 MG PO CHEW
81.0000 mg | CHEWABLE_TABLET | Freq: Every day | ORAL | Status: DC
  Administered 2019-12-27 – 2019-12-28 (×2): 81 mg via ORAL
  Filled 2019-12-26 (×2): qty 1

## 2019-12-26 MED ORDER — ASPIRIN 325 MG PO TABS
325.0000 mg | ORAL_TABLET | Freq: Once | ORAL | Status: AC
  Administered 2019-12-26: 325 mg via ORAL
  Filled 2019-12-26: qty 1

## 2019-12-26 MED ORDER — METOCLOPRAMIDE HCL 5 MG/ML IJ SOLN
10.0000 mg | Freq: Once | INTRAMUSCULAR | Status: AC
  Administered 2019-12-26: 10 mg via INTRAVENOUS
  Filled 2019-12-26: qty 2

## 2019-12-26 MED ORDER — ATORVASTATIN CALCIUM 80 MG PO TABS
80.0000 mg | ORAL_TABLET | Freq: Every day | ORAL | Status: DC
  Administered 2019-12-26 – 2019-12-28 (×3): 80 mg via ORAL
  Filled 2019-12-26: qty 2
  Filled 2019-12-26 (×2): qty 1

## 2019-12-26 MED ORDER — CLOPIDOGREL BISULFATE 75 MG PO TABS
75.0000 mg | ORAL_TABLET | Freq: Once | ORAL | Status: AC
  Administered 2019-12-26: 75 mg via ORAL
  Filled 2019-12-26: qty 1

## 2019-12-26 MED ORDER — SODIUM CHLORIDE 0.9 % IV SOLN
1.0000 g | INTRAVENOUS | Status: DC
  Administered 2019-12-26 – 2019-12-27 (×2): 1 g via INTRAVENOUS
  Filled 2019-12-26 (×2): qty 10

## 2019-12-26 MED ORDER — IOHEXOL 350 MG/ML SOLN
100.0000 mL | Freq: Once | INTRAVENOUS | Status: AC | PRN
  Administered 2019-12-26: 100 mL via INTRAVENOUS

## 2019-12-26 MED ORDER — LABETALOL HCL 5 MG/ML IV SOLN: 10.0000 mg | INTRAVENOUS | Status: DC | PRN

## 2019-12-26 MED ORDER — DIPHENHYDRAMINE HCL 50 MG/ML IJ SOLN
12.5000 mg | Freq: Once | INTRAMUSCULAR | Status: AC
  Administered 2019-12-26: 12.5 mg via INTRAVENOUS
  Filled 2019-12-26: qty 1

## 2019-12-26 MED ORDER — ASPIRIN 325 MG PO TABS: 325.0000 mg | ORAL_TABLET | Freq: Every day | ORAL | Status: DC

## 2019-12-26 MED ORDER — ALTEPLASE 100 MG IV SOLR
INTRAVENOUS | Status: AC
  Filled 2019-12-26: qty 100

## 2019-12-26 MED ORDER — INSULIN ASPART 100 UNIT/ML ~~LOC~~ SOLN: 0.0000 [IU] | Freq: Every day | SUBCUTANEOUS | Status: DC

## 2019-12-26 NOTE — ED Notes (Signed)
Initial NIH taken at 1017

## 2019-12-26 NOTE — ED Triage Notes (Signed)
Pt [facility]KW 2230 yesterday. Recent surgery.  Woke up this am at 0730 with expressive aphasia and slurred speech

## 2019-12-26 NOTE — ED Notes (Signed)
Called Bed Placement for bed status as requested by Hospitalist.  "Still no Progressive beds, Have potential discharges soon".

## 2019-12-26 NOTE — ED Provider Notes (Signed)
[facility] EMERGENCY DEPARTMENT Provider Note   CSN: [id number] Arrival date & time: 12/26/19  1012  An emergency department physician performed an initial assessment on this suspected stroke patient at 1022.  History CC: Slurred speech  Joanna Reid is a 54 y.o. female with a history of diabetes, hypertension, multiple myeloma in remission, migraines, presented to the emergency department with slurred speech and headache.  Patient's daughter reports Spoke to the patient at 27 PM yesterday evening, which time her speech was fine.  The patient did undergo a colonoscopy yesterday with anesthesia but had recovered fully from that.  This morning the patient reports that she woke up with a headache and had slurred speech.  Daughter noted the patient is having difficulty getting words out.  She has never happened before.  She denies any history of stroke or TIA.  She denies any history of A. fib.  She denies any anticoagulation use.    HPI     Past Medical History:  Diagnosis Date  . Acid reflux   . Allergic rhinitis   . Cancer ([place])    multiple myeloma  . Diabetes mellitus    type 2  . Gout   . Gout   . HBP (high blood pressure)   . History of kidney stones   . Migraines     Patient Active Problem List   Diagnosis Date Noted  . 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 Acute bronchiolitis due to respiratory syncytial virus (RSV) 08/11/2018  . Hypomagnesemia 08/10/2018  . Hyperlipidemia 08/09/2018  . Anemia 08/09/2018  . Leukopenia 08/09/2018  . Hypokalemia 08/09/2018  . Hypocalcemia 08/09/2018  . Lactic acidosis 08/09/2018  . Sepsis ([place]) 04/25/2018  . Goals of care, counseling/discussion 11/01/2017  . Multiple myeloma ([place]) 10/29/2017  . Hypercalcemia 10/29/2017  . Type 2 diabetes mellitus without complication, without long-term current use of insulin ([place]) 01/27/2016  . Morbid obesity due to excess calories ([place]) 01/27/2016  . Anxiety as acute reaction to exceptional  stress 01/13/2015  . Attention deficit hyperactivity disorder (ADHD), 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  . Gout 01/20/2014  . GERD (gastroesophageal reflux disease) 01/20/2014  . Essential hypertension, benign 01/20/2014  . Plantar fasciitis of left foot 09/19/2011    Past Surgical History:  Procedure Laterality Date  . BREAST CYST EXCISION Left    2009 no visible scar on skin  . CESAREAN SECTION    . EXTRACORPOREAL SHOCK WAVE LITHOTRIPSY Left 10/10/2017   Procedure: LEFT EXTRACORPOREAL SHOCK WAVE LITHOTRIPSY (ESWL);  Surgeon: Bjorn Loser, MD;  Location: [facility];  Service: Urology;  Laterality: Left;  . EYE SURGERY    . HEMORRHOID SURGERY N/A 11/19/2012   Procedure: HEMORRHOIDECTOMY;  Surgeon: Jamesetta So, MD;  Location: [facility];  Service: General;  Laterality: N/A;  . kidney stones  1998  . LAPAROSCOPIC UNILATERAL SALPINGO OOPHERECTOMY  05/14/2012   Procedure: LAPAROSCOPIC UNILATERAL SALPINGO OOPHORECTOMY;  Surgeon: [name], MD;  Location: [facility];  Service: Gynecology;  Laterality: Right;  laparoscopic right salpingo-oophorectomy  . PARTIAL HYSTERECTOMY    . TONSILECTOMY, ADENOIDECTOMY, BILATERAL MYRINGOTOMY AND TUBES    . VESICOVAGINAL FISTULA CLOSURE W/ TAH       OB History   No obstetric history on file.     Family History  Problem Relation Age of Onset  . Arthritis Other   . Cancer Other   . Diabetes Other   . Hypertension Mother   . Dementia Mother   . Diabetes Father   . ALS Father   .  Diabetes Brother   . Hypertension Brother   . Cancer Paternal Aunt   . COPD Maternal Grandmother   . Cancer Maternal Grandfather   . Anesthesia problems Paternal Grandfather     Social History   Tobacco Use  . Smoking status: Former Smoker    Quit date: 02/27/1999    Years since quitting: 20.8  . Smokeless tobacco: Never Used  . Tobacco comment: socially   Vaping Use  . Vaping Use: Never used  Substance Use Topics  . Alcohol use: No    Alcohol/week: 0.0 standard drinks    . Drug use: No    Home Medications Prior to Admission medications   Medication Sig Start Date End Date Taking? Authorizing Provider  acyclovir (ZOVIRAX) 400 MG tablet TAKE (1) TABLET BY MOUTH TWICE DAILY. 12/15/19  Yes Derek Jack, MD  allopurinol (ZYLOPRIM) 300 MG tablet TAKE 1 TABLET DAILY Patient taking differently: Take 300 mg by mouth daily.  10/13/19  Yes Mikey Kirschner, MD  aspirin EC 81 MG tablet Take 1 tablet (81 mg total) by mouth daily. 12/26/19  Yes Rehman, Mechele Dawley, MD  bortezomib SQ (VELCADE) 3.5 MG SOLR Inject 1.2 mLs (3 mg total) into the skin every 14 (fourteen) days for 6 doses. 12/23/19 03/03/20 Yes Derek Jack, MD  carvedilol (COREG) 25 MG tablet Take 25 mg by mouth 2 (two) times daily. 05/05/19  Yes [provider]  diazepam (VALIUM) 5 MG tablet Take 1 tablet (5 mg total) by mouth at bedtime. 10/13/19  Yes Mikey Kirschner, MD  Docosanol (ABREVA) 10 % CREA Apply 1 application topically daily as needed (for nose).   Yes [provider]  docusate sodium (COLACE) 100 MG capsule Take 100 mg by mouth daily.    Yes [provider]  fluticasone (FLONASE) 50 MCG/ACT nasal spray Place 2 sprays into both nostrils daily. 09/17/19  Yes Wurst, [place], PA-C  lenalidomide (REVLIMID) 10 MG capsule TAKE 1 CAPSULE BY MOUTH  DAILY FOR 21 DAYS ON, THEN  7 DAYS OFF 12/16/19  Yes Derek Jack, MD  losartan (COZAAR) 25 MG tablet Take 25 mg by mouth in the morning and at bedtime.  04/22/19  Yes [provider]  Magnesium 200 MG TABS Take 600 mg by mouth daily.   Yes [provider]  metFORMIN (GLUCOPHAGE) 500 MG tablet TAKE 1 TABLET BY MOUTH ONCE DAILY WITH BREAKFAST Patient taking differently: Take 1,000 mg by mouth 2 (two) times daily with a meal.  07/21/18  Yes Mikey Kirschner, MD  omeprazole (PRILOSEC) 20 MG capsule Take 1 capsule (20 mg total) by mouth daily. 10/13/19  Yes Mikey Kirschner, MD  potassium chloride SA (KLOR-CON)  20 MEQ tablet TAKE (1) TABLET BY MOUTH TWICE DAILY. Patient taking differently: Take 20 mEq by mouth daily.  10/13/19  Yes Mikey Kirschner, MD  pregabalin (LYRICA) 200 MG capsule TAKE (1) CAPSULE BY MOUTH TWICE DAILY. Patient taking differently: Take 200 mg by mouth daily.  08/05/19  Yes Derek Jack, MD  Propylene Glycol (SYSTANE BALANCE) 0.6 % SOLN Apply 1 drop to eye daily as needed (dry eye).   Yes [provider]  rosuvastatin (CRESTOR) 5 MG tablet Take 5 mg by mouth daily.  05/14/18  Yes [provider]  valACYclovir (VALTREX) 1000 MG tablet TAKE 2 TABLETS NOW, THEN 2 TABLETS 12 HOURS LATER. 11/09/19  Yes Derek Jack, MD  albuterol (PROVENTIL HFA;VENTOLIN HFA) 108 (90 Base) MCG/ACT inhaler Inhale 2 puffs into the lungs every  4 (four) hours as needed for wheezing or shortness of breath. Patient not taking: Reported on 12/22/2019 08/11/18   Irwin Brakeman L, MD  bumetanide (BUMEX) 0.5 MG tablet Take 0.5 mg by mouth daily.  06/30/19   [provider]  cetirizine (ZYRTEC) 10 MG tablet Take 1 tablet (10 mg total) by mouth daily. Patient not taking: Reported on 12/26/2019 09/17/19   Wurst, [place], PA-C  HYDROcodone-acetaminophen (NORCO/VICODIN) 5-325 MG tablet Take 1 tablet by mouth at bedtime as needed for moderate pain. Patient not taking: Reported on 12/26/2019 10/16/18   Derek Jack, MD  magnesium oxide (MAG-OX) 400 (241.3 Mg) MG tablet Take 1 tablet (400 mg total) by mouth 3 (three) times daily. Patient not taking: Reported on 12/26/2019 10/02/18   Derek Jack, MD  TRULICITY 1.5 WO/0.3OZ SOPN Inject 0.75 mg into the skin every Monday.  05/14/18   [provider]    Allergies    Patient has no known allergies.  Review of Systems   Review of Systems  Constitutional: Negative for chills and fever.  Eyes: Negative for pain and visual disturbance.  Respiratory: Negative for cough and shortness of breath.   Cardiovascular:  Negative for chest pain and palpitations.  Gastrointestinal: Negative for abdominal pain and vomiting.  Genitourinary: Negative for dysuria and hematuria.  Musculoskeletal: Negative for arthralgias and back pain.  Skin: Negative for color change and rash.  Neurological: Positive for speech difficulty and headaches. Negative for seizures, syncope, facial asymmetry and weakness.  All other systems reviewed and are negative.   Physical Exam Updated Vital Signs BP (!) 156/80 (BP Location: Left Arm)   Pulse 70   Temp 98.7 F (37.1 C) (Oral)   Resp 14   Ht 5' 4"  (1.626 m)   Wt 114.9 kg   SpO2 100%   BMI 43.48 kg/m   Physical Exam Vitals and nursing note reviewed.  Constitutional:      General: She is not in acute distress.    Appearance: She is well-developed.  HENT:     Head: Normocephalic and atraumatic.  Eyes:     General: No visual field deficit.    Conjunctiva/sclera: Conjunctivae normal.  Cardiovascular:     Rate and Rhythm: Normal rate and regular rhythm.     Pulses: Normal pulses.  Pulmonary:     Effort: Pulmonary effort is normal. No respiratory distress.     Breath sounds: Normal breath sounds.  Abdominal:     Palpations: Abdomen is soft.     Tenderness: There is no abdominal tenderness.  Musculoskeletal:     Cervical back: Neck supple.  Skin:    General: Skin is warm and dry.  Neurological:     Mental Status: She is alert.     GCS: GCS eye subscore is 4. GCS verbal subscore is 5. GCS motor subscore is 6.     Cranial Nerves: No facial asymmetry.     Sensory: Sensation is intact.     Motor: Motor function is intact. No pronator drift.     Comments: Mild to moderate expressive aphasia  Comprehension intact  Psychiatric:        Mood and Affect: Mood normal.        Behavior: Behavior normal.     ED Results / Procedures / Treatments   Labs (all labs ordered are listed, but only abnormal results are displayed) Labs Reviewed  CBC - Abnormal; Notable for the  following components:      Result Value   WBC 2.4 (*)  RBC 3.61 (*)    Hemoglobin 11.1 (*)    HCT 34.5 (*)    Platelets 104 (*)    All other components within normal limits  DIFFERENTIAL - Abnormal; Notable for the following components:   Neutro Abs 1.0 (*)    All other components within normal limits  COMPREHENSIVE METABOLIC PANEL - Abnormal; Notable for the following components:   Glucose, Bld 103 (*)    Creatinine, Ser 1.04 (*)    Calcium 8.8 (*)    All other components within normal limits  RAPID URINE DRUG SCREEN, HOSP PERFORMED - Abnormal; Notable for the following components:   Benzodiazepines POSITIVE (*)    All other components within normal limits  URINALYSIS, ROUTINE W REFLEX MICROSCOPIC - Abnormal; Notable for the following components:   Specific Gravity, Urine >1.046 (*)    Nitrite POSITIVE (*)    Bacteria, UA RARE (*)    All other components within normal limits  CBG MONITORING, ED - Abnormal; Notable for the following components:   Glucose-Capillary 101 (*)    All other components within normal limits  I-STAT CHEM 8, ED - Abnormal; Notable for the following components:   Glucose, Bld 103 (*)    Hemoglobin 11.6 (*)    HCT 34.0 (*)    All other components within normal limits  SARS CORONAVIRUS 2 BY RT PCR (HOSPITAL ORDER, [facility])  ETHANOL  PROTIME-INR  APTT  POC URINE PREG, ED    EKG None  Radiology CT Code Stroke CTA Head W/WO contrast  Result Date: 12/26/2019 CLINICAL DATA:  Expressive aphasia. Last known normal at 11 p.m. last evening. EXAM: CT ANGIOGRAPHY HEAD AND NECK CT PERFUSION BRAIN TECHNIQUE: Multidetector CT imaging of the head and neck was performed using the standard protocol during bolus administration of intravenous contrast. Multiplanar CT image reconstructions and MIPs were obtained to evaluate the vascular anatomy. Carotid stenosis measurements (when applicable) are obtained utilizing NASCET criteria, using  the distal internal carotid diameter as the denominator. Multiphase CT imaging of the brain was performed following IV bolus contrast injection. Subsequent parametric perfusion maps were calculated using RAPID software. CONTRAST:  100 mL Omnipaque 350 COMPARISON:  CT head without contrast 12/26/2019 FINDINGS: CTA NECK FINDINGS Aortic arch: A 3 vessel arch configuration is present. Mild atherosclerotic changes are present without significant stenosis or aneurysm. Right carotid system: The right common carotid artery is within normal limits. Bifurcation is unremarkable. Moderate tortuosity is present in the cervical right ICA without significant stenosis. Left carotid system: The left common carotid artery is within normal limits. Atherosclerotic changes are noted at the bifurcation. Moderate tortuosity is present in the cervical left ICA without significant stenosis. Vertebral arteries: The vertebral arteries are codominant. Tortuosity is noted proximally without significant stenosis. Both vertebral arteries originate from the subclavian arteries. Atherosclerotic changes are noted on the right without significant stenosis. No significant stenosis is present in either vertebral artery in the neck. Skeleton: Multilevel degenerative changes are present cervical spine. Slight degenerative anterolisthesis is present at C2-3 and C3-4. Multilevel endplate changes are present at C4-5 most notably. No focal lytic or blastic lesions are present. Other neck: The soft tissues the neck otherwise within normal limits. No neck lesions are present. Salivary glands are within normal limits. Thyroid is normal. No significant adenopathy is present. Upper chest: The lung apices are clear. Thoracic inlet is within normal limits. Review of the MIP images confirms the above findings CTA HEAD FINDINGS Anterior circulation: Atherosclerotic calcifications  are present within the cavernous internal carotid arteries bilaterally without  significant stenosis. Terminal ICA is normal bilaterally. The A1 and M1 segments are normal. The anterior communicating artery is patent. MCA bifurcations are intact. Moderate attenuation of distal branch vessels is present without a significant proximal stenosis or occlusion. No aneurysm is present. Posterior circulation: The vertebral arteries are codominant. PICA origins are visualized and normal. The left V4 segment is hypoplastic. Vertebrobasilar junction is normal. Mild narrowing is present the mid basilar artery, to 50%. Both posterior cerebral arteries originate from basilar tip. The PCA branch vessels demonstrate distal attenuation without a significant proximal stenosis or occlusion. Venous sinuses: The dural sinuses are patent. The straight sinus deep cerebral veins are intact. Cortical veins are unremarkable. Anatomic variants: None Review of the MIP images confirms the above findings CT Brain Perfusion Findings: ASPECTS: 10/10 CBF (<30%) Volume: 75m Perfusion (Tmax>6.0s) volume: 041mMismatch Volume: 2413mnfarction Location:Left parietal lobe, watershed region. IMPRESSION: 1. 24 mL left parietal lobe watershed region area of ischemia without defined core infarct. 2. No emergent large vessel occlusion. 3. Mild narrowing of the mid basilar artery without other significant proximal stenosis, aneurysm, or branch vessel occlusion. 4. Moderate distal small vessel disease in both the anterior and posterior circulation. 5. Moderate tortuosity of the cervical internal carotid arteries bilaterally, likely secondary to hypertension. 6. Atherosclerotic changes at the left carotid bifurcation and cavernous internal carotid arteries bilaterally without significant stenosis. 7. Multilevel degenerative changes in the cervical spine. These results were called by telephone at the time of interpretation on 12/26/2019 at 11:31 am to provider Dr. TriLangston Maskerho verbally acknowledged these results. Electronically Signed   By:  ChrSan MorelleD.   On: 12/26/2019 11:31   CT Code Stroke CTA Neck W/WO contrast  Result Date: 12/26/2019 CLINICAL DATA:  Expressive aphasia. Last known normal at 11 p.m. last evening. EXAM: CT ANGIOGRAPHY HEAD AND NECK CT PERFUSION BRAIN TECHNIQUE: Multidetector CT imaging of the head and neck was performed using the standard protocol during bolus administration of intravenous contrast. Multiplanar CT image reconstructions and MIPs were obtained to evaluate the vascular anatomy. Carotid stenosis measurements (when applicable) are obtained utilizing NASCET criteria, using the distal internal carotid diameter as the denominator. Multiphase CT imaging of the brain was performed following IV bolus contrast injection. Subsequent parametric perfusion maps were calculated using RAPID software. CONTRAST:  100 mL Omnipaque 350 COMPARISON:  CT head without contrast 12/26/2019 FINDINGS: CTA NECK FINDINGS Aortic arch: A 3 vessel arch configuration is present. Mild atherosclerotic changes are present without significant stenosis or aneurysm. Right carotid system: The right common carotid artery is within normal limits. Bifurcation is unremarkable. Moderate tortuosity is present in the cervical right ICA without significant stenosis. Left carotid system: The left common carotid artery is within normal limits. Atherosclerotic changes are noted at the bifurcation. Moderate tortuosity is present in the cervical left ICA without significant stenosis. Vertebral arteries: The vertebral arteries are codominant. Tortuosity is noted proximally without significant stenosis. Both vertebral arteries originate from the subclavian arteries. Atherosclerotic changes are noted on the right without significant stenosis. No significant stenosis is present in either vertebral artery in the neck. Skeleton: Multilevel degenerative changes are present cervical spine. Slight degenerative anterolisthesis is present at C2-3 and C3-4.  Multilevel endplate changes are present at C4-5 most notably. No focal lytic or blastic lesions are present. Other neck: The soft tissues the neck otherwise within normal limits. No neck lesions are present. Salivary glands are within normal limits.  Thyroid is normal. No significant adenopathy is present. Upper chest: The lung apices are clear. Thoracic inlet is within normal limits. Review of the MIP images confirms the above findings CTA HEAD FINDINGS Anterior circulation: Atherosclerotic calcifications are present within the cavernous internal carotid arteries bilaterally without significant stenosis. Terminal ICA is normal bilaterally. The A1 and M1 segments are normal. The anterior communicating artery is patent. MCA bifurcations are intact. Moderate attenuation of distal branch vessels is present without a significant proximal stenosis or occlusion. No aneurysm is present. Posterior circulation: The vertebral arteries are codominant. PICA origins are visualized and normal. The left V4 segment is hypoplastic. Vertebrobasilar junction is normal. Mild narrowing is present the mid basilar artery, to 50%. Both posterior cerebral arteries originate from basilar tip. The PCA branch vessels demonstrate distal attenuation without a significant proximal stenosis or occlusion. Venous sinuses: The dural sinuses are patent. The straight sinus deep cerebral veins are intact. Cortical veins are unremarkable. Anatomic variants: None Review of the MIP images confirms the above findings CT Brain Perfusion Findings: ASPECTS: 10/10 CBF (<30%) Volume: 22m Perfusion (Tmax>6.0s) volume: 01mMismatch Volume: 2451mnfarction Location:Left parietal lobe, watershed region. IMPRESSION: 1. 24 mL left parietal lobe watershed region area of ischemia without defined core infarct. 2. No emergent large vessel occlusion. 3. Mild narrowing of the mid basilar artery without other significant proximal stenosis, aneurysm, or branch vessel  occlusion. 4. Moderate distal small vessel disease in both the anterior and posterior circulation. 5. Moderate tortuosity of the cervical internal carotid arteries bilaterally, likely secondary to hypertension. 6. Atherosclerotic changes at the left carotid bifurcation and cavernous internal carotid arteries bilaterally without significant stenosis. 7. Multilevel degenerative changes in the cervical spine. These results were called by telephone at the time of interpretation on 12/26/2019 at 11:31 am to provider Dr. TriLangston Maskerho verbally acknowledged these results. Electronically Signed   By: ChrSan MorelleD.   On: 12/26/2019 11:31   CT Code Stroke Cerebral Perfusion with contrast  Result Date: 12/26/2019 CLINICAL DATA:  Expressive aphasia. Last known normal at 11 p.m. last evening. EXAM: CT ANGIOGRAPHY HEAD AND NECK CT PERFUSION BRAIN TECHNIQUE: Multidetector CT imaging of the head and neck was performed using the standard protocol during bolus administration of intravenous contrast. Multiplanar CT image reconstructions and MIPs were obtained to evaluate the vascular anatomy. Carotid stenosis measurements (when applicable) are obtained utilizing NASCET criteria, using the distal internal carotid diameter as the denominator. Multiphase CT imaging of the brain was performed following IV bolus contrast injection. Subsequent parametric perfusion maps were calculated using RAPID software. CONTRAST:  100 mL Omnipaque 350 COMPARISON:  CT head without contrast 12/26/2019 FINDINGS: CTA NECK FINDINGS Aortic arch: A 3 vessel arch configuration is present. Mild atherosclerotic changes are present without significant stenosis or aneurysm. Right carotid system: The right common carotid artery is within normal limits. Bifurcation is unremarkable. Moderate tortuosity is present in the cervical right ICA without significant stenosis. Left carotid system: The left common carotid artery is within normal limits.  Atherosclerotic changes are noted at the bifurcation. Moderate tortuosity is present in the cervical left ICA without significant stenosis. Vertebral arteries: The vertebral arteries are codominant. Tortuosity is noted proximally without significant stenosis. Both vertebral arteries originate from the subclavian arteries. Atherosclerotic changes are noted on the right without significant stenosis. No significant stenosis is present in either vertebral artery in the neck. Skeleton: Multilevel degenerative changes are present cervical spine. Slight degenerative anterolisthesis is present at C2-3 and C3-4. Multilevel endplate changes  are present at C4-5 most notably. No focal lytic or blastic lesions are present. Other neck: The soft tissues the neck otherwise within normal limits. No neck lesions are present. Salivary glands are within normal limits. Thyroid is normal. No significant adenopathy is present. Upper chest: The lung apices are clear. Thoracic inlet is within normal limits. Review of the MIP images confirms the above findings CTA HEAD FINDINGS Anterior circulation: Atherosclerotic calcifications are present within the cavernous internal carotid arteries bilaterally without significant stenosis. Terminal ICA is normal bilaterally. The A1 and M1 segments are normal. The anterior communicating artery is patent. MCA bifurcations are intact. Moderate attenuation of distal branch vessels is present without a significant proximal stenosis or occlusion. No aneurysm is present. Posterior circulation: The vertebral arteries are codominant. PICA origins are visualized and normal. The left V4 segment is hypoplastic. Vertebrobasilar junction is normal. Mild narrowing is present the mid basilar artery, to 50%. Both posterior cerebral arteries originate from basilar tip. The PCA branch vessels demonstrate distal attenuation without a significant proximal stenosis or occlusion. Venous sinuses: The dural sinuses are patent.  The straight sinus deep cerebral veins are intact. Cortical veins are unremarkable. Anatomic variants: None Review of the MIP images confirms the above findings CT Brain Perfusion Findings: ASPECTS: 10/10 CBF (<30%) Volume: 32m Perfusion (Tmax>6.0s) volume: 066mMismatch Volume: 2441mnfarction Location:Left parietal lobe, watershed region. IMPRESSION: 1. 24 mL left parietal lobe watershed region area of ischemia without defined core infarct. 2. No emergent large vessel occlusion. 3. Mild narrowing of the mid basilar artery without other significant proximal stenosis, aneurysm, or branch vessel occlusion. 4. Moderate distal small vessel disease in both the anterior and posterior circulation. 5. Moderate tortuosity of the cervical internal carotid arteries bilaterally, likely secondary to hypertension. 6. Atherosclerotic changes at the left carotid bifurcation and cavernous internal carotid arteries bilaterally without significant stenosis. 7. Multilevel degenerative changes in the cervical spine. These results were called by telephone at the time of interpretation on 12/26/2019 at 11:31 am to provider Dr. TriLangston Maskerho verbally acknowledged these results. Electronically Signed   By: ChrSan MorelleD.   On: 12/26/2019 11:31   ECHOCARDIOGRAM COMPLETE  Result Date: 12/26/2019    ECHOCARDIOGRAM REPORT   Patient Name:   ROBGRACIELYNN BIRKELte of Exam: 12/26/2019 Medical Rec #:  [id number]     Height:       64.0 in Accession #:    [id number]    Weight:       253.3 lb Date of Birth:  2/1August 22, 1967     BSA:          2.163 m Patient Age:    [age]ars         BP:           149/74 mmHg Patient Gender: F                HR:           52 bpm. [facility]: 2D Echo Indications:    stroke 434.91  History:        Patient has prior history of Echocardiogram examinations, most                 recent 12/08/2014. Risk Factors:Diabetes and Former Smoker. High                 blood pressure. cancer.   Sonographer:    VijJannett Celestine  RDCS (AE) Referring Phys: [id number] [facility]  Sonographer Comments: Image acquisition challenging due to patient body habitus. limited mobility IMPRESSIONS  1. Left ventricular ejection fraction, by estimation, is 60 to 65%. The left ventricle has normal function. The left ventricle has no regional wall motion abnormalities. There is mild left ventricular hypertrophy of the basal-septal segment. Left ventricular diastolic function could not be evaluated.  2. Right ventricular systolic function is normal. The right ventricular size is normal. Tricuspid regurgitation signal is inadequate for assessing PA pressure.  3. The mitral valve is normal in structure. Trivial mitral valve regurgitation. No evidence of mitral stenosis.  4. The aortic valve is normal in structure. Aortic valve regurgitation is trivial. No aortic stenosis is present. FINDINGS  Left Ventricle: Left ventricular ejection fraction, by estimation, is 60 to 65%. The left ventricle has normal function. The left ventricle has no regional wall motion abnormalities. The left ventricular internal cavity size was normal in size. There is  mild left ventricular hypertrophy of the basal-septal segment. Left ventricular diastolic function could not be evaluated. Right Ventricle: The right ventricular size is normal. No increase in right ventricular wall thickness. Right ventricular systolic function is normal. Tricuspid regurgitation signal is inadequate for assessing PA pressure. Left Atrium: Left atrial size was normal in size. Right Atrium: Right atrial size was normal in size. Pericardium: There is no evidence of pericardial effusion. Mitral Valve: The mitral valve is normal in structure. Normal mobility of the mitral valve leaflets. Trivial mitral valve regurgitation. No evidence of mitral valve stenosis. Tricuspid Valve: The tricuspid valve is normal in structure. Tricuspid valve regurgitation is trivial. No evidence of  tricuspid stenosis. Aortic Valve: The aortic valve is normal in structure. Aortic valve regurgitation is trivial. No aortic stenosis is present. Pulmonic Valve: The pulmonic valve was normal in structure. Pulmonic valve regurgitation is not visualized. No evidence of pulmonic stenosis. Aorta: The aortic root is normal in size and structure. Venous: The inferior vena cava was not well visualized. IAS/Shunts: No atrial level shunt detected by color flow Doppler.  LEFT VENTRICLE PLAX 2D LVIDd:         4.91 cm LVIDs:         2.93 cm LV PW:         0.99 cm LV IVS:        1.18 cm LVOT diam:     2.10 cm LV SV:         58 LV SV Index:   27 LVOT Area:     3.46 cm  RIGHT VENTRICLE TAPSE (M-mode): 2.9 cm LEFT ATRIUM             Index LA diam:        3.80 cm 1.76 cm/m LA Vol (A2C):   85.0 ml 39.30 ml/m LA Vol (A4C):   56.1 ml 25.94 ml/m LA Biplane Vol: 72.8 ml 33.66 ml/m  AORTIC VALVE LVOT Vmax:   70.50 cm/s LVOT Vmean:  55.200 cm/s LVOT VTI:    0.167 m  AORTA Ao Root diam: 3.40 cm MITRAL VALVE MV Area (PHT): 1.86 cm    SHUNTS MV Decel Time: 407 msec    Systemic VTI:  0.17 m MV E velocity: 61.30 cm/s  Systemic Diam: 2.10 cm MV A velocity: 45.00 cm/s MV E/A ratio:  1.36 Fransico Him MD Electronically signed by Fransico Him MD Signature Date/Time: 12/26/2019/4:47:12 PM    Final    CT HEAD CODE STROKE WO CONTRAST  Result Date: 12/26/2019 CLINICAL  DATA:  Code stroke. Sudden onset of abnormal speech beginning this morning. Last known normal was 11 p.m. last night. EXAM: CT HEAD WITHOUT CONTRAST TECHNIQUE: Contiguous axial images were obtained from the base of the skull through the vertex without intravenous contrast. COMPARISON:  CT maxillofacial 04/22/2016 FINDINGS: Brain: No acute infarct, hemorrhage, or mass lesion is present. Basal ganglia are normal. Insular ribbon is normal bilaterally. No significant white matter lesions are present. No focal cortical lesions are present. The brainstem and cerebellum are within  normal limits. Vascular: No hyperdense vessel or unexpected calcification. Skull: Calvarium is intact. No focal lytic or blastic lesions are present. No significant extracranial soft tissue lesion is present. Sinuses/Orbits: The right maxillary sinus is shrunken suggesting a history of chronic disease. No active disease is present. The paranasal sinuses and mastoid air cells are otherwise clear. The globes and orbits are within normal limits. ASPECTS [facility] Stroke Program Early CT Score) - Ganglionic level infarction (caudate, lentiform nuclei, internal capsule, insula, M1-M3 cortex): 7/7 - Supraganglionic infarction (M4-M6 cortex): 3/3 Total score (0-10 with 10 being normal): 10/10 IMPRESSION: 1. Normal CT appearance of the brain. 2. ASPECTS is 10/10. These results were called by telephone at the time of interpretation on 12/26/2019 at 10:35 am to provider [facility] , who verbally acknowledged these results. Electronically Signed   By: San Morelle M.D.   On: 12/26/2019 10:35    Procedures .Critical Care Performed by: Wyvonnia Dusky, MD Authorized by: Wyvonnia Dusky, MD   Critical care provider statement:    Critical care time (minutes):  45   Critical care was necessary to treat or prevent imminent or life-threatening deterioration of the following conditions:  CNS failure or compromise   Critical care was time spent personally by me on the following activities:  Discussions with consultants, evaluation of patient's response to treatment, examination of patient, ordering and performing treatments and interventions, ordering and review of laboratory studies, ordering and review of radiographic studies, pulse oximetry, re-evaluation of patient's condition, obtaining history from patient or surrogate and review of old charts Comments:     Stroke management, repeat neuro checks   (including critical care time)  Medications Ordered in ED Medications  atorvastatin (LIPITOR) tablet  80 mg (80 mg Oral Given 12/26/19 1450)  iohexol (OMNIPAQUE) 350 MG/ML injection 100 mL (100 mLs Intravenous Contrast Given 12/26/19 1117)  metoCLOPramide (REGLAN) injection 10 mg (10 mg Intravenous Given 12/26/19 1212)  diphenhydrAMINE (BENADRYL) injection 12.5 mg (12.5 mg Intravenous Given 12/26/19 1214)  aspirin tablet 325 mg (325 mg Oral Given 12/26/19 1211)    ED Course  I have reviewed the triage vital signs and the nursing notes.  Pertinent labs & imaging results that were available during my care of the patient were reviewed by me and considered in my medical decision making (see chart for details).  54 yo female here with expressive aphasia, LKW was 11 pm last night, woke up with symptoms this morning.   Stroke alert and labs ordered Glucose WNL  DDx included complex migraine with her history   CT imaging concerning for stroke Teleneurologist consulted and discussed with interventionalist, who both did not feel the patient was a candidate for either tPA or IR intervention Advised admission and close neurology follow up Pt admitted to hospitalist, whom I advised to recommend an urgent neuro consult upon her arrival at [facility] after transfer.  During her stay in the [facility] ED until her signout to the hospitalist, she  had no change or advance of her symptoms.  Her headache was treated with IV medications benadryl and reglan Aspirin was given for stroke as per neurology  Clinical Course as of Dec 26 1730  Sat Dec 26, 2019  1043 IMPRESSION: 1. Normal CT appearance of the brain. 2. ASPECTS is 10/10.  These results were called by telephone at the time of interpretation on 12/26/2019 at 10:35 am to provider [facility] , who verbally acknowledged these results.   [MT]  1116 Per neurology (tele) evaluation, patient back for CTA at CT now, neurologist to discuss with me afterwards.  Patient is not a tPA candidate per teleneurologist.   [MT]  1146 Teleneurologist  discussing with interventional team, he will call me back if the patient is a candidate for intervention   [MT]  1204 Dr Annita Brod teleneurology reports no plans for neurointervention given clot's location, recommending aspirin 325 mg daily and transfer to stroke service for continued neurology evaluation   [MT]  1417 Calcium Ionized: 1.26 [MT]  1417 Admitted by hospitalist Dr Denton Brick   [MT]    Clinical Course User Index [MT] Wyvonnia Dusky, MD    Final Clinical Impression(s) / ED Diagnoses Final diagnoses:  None    Rx / DC Orders ED Discharge Orders    None       Wyvonnia Dusky, MD 12/26/19 1733

## 2019-12-26 NOTE — ED Notes (Signed)
Patient taken to the bathroom for urine sample.  Patient accidentally wasted urine to toilet.  Will attempt later.

## 2019-12-26 NOTE — Progress Notes (Signed)
  Echocardiogram 2D Echocardiogram has been performed.  Joanna Reid 12/26/2019, 3:53 PM

## 2019-12-26 NOTE — ED Notes (Signed)
Family at bedside. 

## 2019-12-26 NOTE — Progress Notes (Signed)
Code stroke time documentation: 5789 Call time 1026 Beeper time 1024 Exam Started 7847 Exam ended 8412 Images sent to [employer] Exam completed in [facility] called

## 2019-12-26 NOTE — Consult Note (Signed)
[facility] TeleNeurology Consult Services   Date of Service:   12/26/2019 10:28:37  Impression:     .  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 right handed lady with a pmh of HTN, HLD, DM2, multiple myeloma, GERD, anxiety, anemia, and other medical issues who presents to the ER with symptoms concerning for stroke. Exam at this time shows a moderate to severe expressive aphasia. She easily follows commands. At times she can get brief sentences out appropriately, and throughout the exam can answer in single word responses appropriately. There are paraphonemic paraphasias. CT head shows no acute intracranial process. She is not an Alteplase candidate as her LKW is >4.5 hours from presentation. CTA head and neck and CTP Brain were pursued. These scans show a 24cc area of penumbra in the L MCA territory without core infarct, and a distal L M3 occlusion on my review. I spoke to Neuro IR Dr. Beatriz Chancellor about the case, including the patient's exam and history. After reviewing the images, Dr. Daneen Schick agrees there is likely a distal L M3 occlusion and determined the risk of an intervention at this time would outweigh the potential benefits given the distal nature of the occlusion and the patient's symptoms. 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 There is also some scattered atheromatous disease without flow limiting stenosis. At this time I recommend transfer to [facility] per local protocols for further management and evaluation of L MCA ischemic stroke. Her headache is likely due to this ischemic stroke. I do not think this represents SAH or any other acute vascular pathology given it is only moderate in severity and focal. Labs in the ER show a mildly elevated creatinine, mild  thrombocytopenia, and mild anemia. I spoke to Dr. Vonna Kotyk of the ER about my assessment and recommendations who reported understanding.  Metrics: Last Known Well: 12/25/2019 22:30:00 [facility] Notification Time: 12/26/2019 10:28:37 Arrival Time: 12/26/2019 10:12:00 Stamp Time: 12/26/2019 10:28:37 Time First Login Attempt: 12/26/2019 10:32:23 Symptoms: Caucasian NIHSS Start Assessment Time: 12/26/2019 10:34:00 Patient is not a candidate for Alteplase/Activase. Alteplase Medical Decision: 12/26/2019 10:42:50 Patient was not deemed candidate for Alteplase/Activase thrombolytics because of following reasons: Last Well Known Above 4.5 Hours.  CT head showed no acute hemorrhage or acute core infarct.  Radiologist was not called back for review of advanced imaging because scans personally reviewed and I followed up the final reports ED Physician notified of diagnostic impression and management plan on 12/26/2019 11:05:00  Advanced Imaging: CTA Head and Neck Completed.  CTP Completed.  LVO:Yes   Discussed with NIR:Yes   Discussed with NIR Time:12/26/2019 11:34:00   Discussed with NIR Text:  Call to Neuro IR placed at 1126, I spoke to Dr. Beatriz Chancellor about the patient, please see A/P for further details.   Our recommendations are outlined below.  Recommendations:     .  Activate Stroke Protocol Admission/Order Set     .  Stroke/Telemetry Floor     .  Neuro Checks q4h      .  Bedside Swallow Eval     .  DVT Prophylaxis     .  IV Fluids, Normal Saline     .  Head of Bed 30 Degrees     .  Euglycemia and Avoid Hyperthermia (PRN Acetaminophen)     .  Recommend transfer  to [facility] hospital for admission to dedicated stroke unit per local hospital protocols     .  Recommend MRI Brain WO to characterize stroke, further stroke work-up per follow up local Neurology consultation at accepting hospital     .  Recommend Neurology be immediately consulted at [facility] after patient arrives     .  Recommend Aspirin 38m PO daily pending SLP eval (can do 3078msuppository daily pending SLP eval), no DAP given NIHSS >3 and mild thrombocytopenia     .  Recommend continuing home statin for now pending SLP eval, titrate to a goal LDL <70     .  Recommend permissive HTN up to 220/120 for now, if >2>41OINOhange in systolic BP or >1>67EHMChange in diastolic BP please ensure nursing notifies the primary team and then Neurology if change in exam or other concern by primary team     .  Recommend Tylenol 65028m6h PRN for moderate headache, pending SLP eval     .  Alert Neurology immediately with any neuro-worsening  Routine Consultation with [facility] for Follow up Care  Sign Out:     .  Discussed with Emergency Department Provider    ------------------------------------------------------------------------------  History of Present Illness: Patient is a 54 38ar old Female.  Patient was brought by private transportation with symptoms of Caucasian  Joanna Reid a 54 26o. right handed lady with a pmh of HTN, HLD, DM2, multiple myeloma, GERD, anxiety, anemia, and other medical issues who presents to the ER with symptoms concerning for stroke. She was speaking normally last night at 2230 before she went to bed. She then awoke at 0720 today and upon first speaking this morning noted that she was having trouble getting her words out. Her husband confirms the last time he heard her speaking normally was last night when she woke up him to go to work. He called her this morning at around 0810 and noted that she was having trouble speaking. Mrs. HaiTrickeynfirms the last time she was speaking normally was last night before she went to sleep. This morning, when she awoke at about 0720, she also noted that she was experiencing a 5/10 pain in her left occiput. This pain does not radiate anywhere and has not been more intense in its severity. There  is some light sensitivity. She does not feel any nausea or sound sensitivity. It is described as a throbbing pain. She does not commonly get headaches, but her husband notes that she also had a headache last night. Now in the ER she denies any vertigo, blindness, blurry vision, weakness, or numbness. She does feel like her speech is a bit slurred. Her husband thinks her speech sounds normal, but that she is clearly mixing up words. She previously underwent a stem cell transplant back in 2019 for her multiple myeloma. She is currently on maintenance therapy with Velcade.    Past Medical History:     . Hypertension     . Diabetes Mellitus     . Hyperlipidemia     . There is NO history of Atrial Fibrillation     . There is NO history of Coronary Artery Disease     . There is NO history of Stroke  Anticoagulant use:  No  Antiplatelet use: Aspirin  Labs: As discussed above  Radiology: CT Head 12/26/2019 IMPRESSION: 1. Normal CT appearance of the brain. 2. ASPECTS is 10/10.  CTA Head  and Neck and CTP Brain 12/26/2019 IMPRESSION: 1. 24 mL left parietal lobe watershed region area of ischemia without defined core infarct. 2. No emergent large vessel occlusion. 3. Mild narrowing of the mid basilar artery without other significant proximal stenosis, aneurysm, or branch vessel occlusion. 4. Moderate distal small vessel disease in both the anterior and posterior circulation. 5. Moderate tortuosity of the cervical internal carotid arteries bilaterally, likely secondary to hypertension. 6. Atherosclerotic changes at the left carotid bifurcation and cavernous internal carotid arteries bilaterally without significant stenosis. 7. Multilevel degenerative changes in the cervical spine.  Examination: BP(166/91), Pulse(56), Blood Glucose(101) 1A: Level of Consciousness - Alert; keenly responsive + 0 1B: Ask Month and Age - Aphasic + 2 1C: Blink Eyes & Squeeze Hands - Performs Both Tasks + 0 2:  Test Horizontal Extraocular Movements - Normal + 0 3: Test Visual Fields - No Visual Loss + 0 4: Test Facial Palsy (Use Grimace if Obtunded) - Normal symmetry + 0 5A: Test Left Arm Motor Drift - No Drift for 10 Seconds + 0 5B: Test Right Arm Motor Drift - No Drift for 10 Seconds + 0 6A: Test Left Leg Motor Drift - No Drift for 5 Seconds + 0 6B: Test Right Leg Motor Drift - No Drift for 5 Seconds + 0 7: Test Limb Ataxia (FNF/Heel-Shin) - No Ataxia + 0 8: Test Sensation - Normal; No sensory loss + 0 9: Test Language/Aphasia - Severe Aphasia: Fragmentary Expression, Inference Needed, Cannot Identify Materials + 2 10: Test Dysarthria - Normal + 0 11: Test Extinction/Inattention - No abnormality + 0  NIHSS Score: 4  Pre-Morbid Modified Ranking Scale: 0 Points = No symptoms at all  Mental status is intact.  Patient is A&Ox3, but has trouble stating her age and the month due to aphasia.   There is no receptive aphasia.  There is clear expressive apahsia with paraphonemic paraphasias and ocassional verbal paraphasias, can state yes/no appropriately.  Zola Button can get out a clear brief sentence.  Appears frustrated.  At times speech is non-sensical and speech is often hesitant.  Sometimes the examiner has to infer what she is trying to say, and sometimes options have to be used for patient to confirm aspects of the history.       EOMI without nystagmus.  PERL.  VF full OU.  There is no facial weakness.   No dysarthria.   Tongue midline. Sensation intact in V1-V3 to light touch b/l.     Easily anti-gravity in all 4 extremities.  There is no pronator drift. Strength is 5/5 in b/l grip, biceps, triceps, hip flexors, plantarflexors, and dorsiflexors.    Sensation intact to light touch in all 4 extremities.     No dysmetria on FNF b/l.   No dysmetria on heel to shin b/l.     Patient/Family was informed the Neurology Consult would occur via TeleHealth consult by way of interactive audio and  video telecommunications and consented to receiving care in this manner.   Patient is being evaluated for possible acute neurologic impairment and high probability of imminent or life-threatening deterioration. I spent total of 70 minutes providing care to this patient, including time for face to face visit via telemedicine, review of medical records, imaging studies and discussion of findings with providers, the patient and/or family.   Dr Kathee Delton   [facility] [phone]  Case [id number]

## 2019-12-26 NOTE — H&P (Signed)
Patient Demographics:    Joanna Reid, is a 54 y.o. 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 female reformed smoker with PMHx of HTN, Morbid obesity, DM2, As well as  history of multiple myeloma (s/p Stem cell Transplant 02/21/20 at [facility] and currently on Maintenance Velcade every 2 weeks and Revlimid 10 mg 3 weeks on/1 week off started on 07/29/2018), who is status post screening colonoscopy on 12/25/2019 and presented with confusion and speech deficits primarily expressive aphasia on 12/26/19----cranial imaging shows 24 mL left parietal lobe watershed region area of ischemia without defined core infarct. -Telemetry neurologist recommends transfer to [facility] hospital for in-house neurology and possible IR consult due to Moderate distal small vessel disease in both the anterior and posterior circulation and  Moderate tortuosity of the cervical internal carotid arteries bilaterally, likely secondary to hypertension -Patient was out of the window for TPA  A/p 1)Suspicion Acute CVA/Left parietal lobe, 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  - -place on telemetry monitored unit,  -PTA patient was on aspirin 81 mg, will give aspirin and Plavix combo along with Lipitor  80 mg  - MRI of the brain pending,  ---echocardiogram without intracardiac thrombus  And  EF is 60-65 without wall motion abnormalities,  -- CTA Head/Neck with Left parietal lobe 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  in house Neurology consult pending.  PT/OT eval pending, speech eval pending, We will allow some permissive hypertension in view of possible acute stroke, avoid precipitous drop in blood pressure. Use Labetalol 10 mg iv every 4 hrs as needed for systolic blood pressure over 210 mmhg , check TSH, A1c and fasting lipid profile,  -Discussed with neurologist Dr. Shelly Flatten maintain euthermia. Tylenol prn for temp >100.4   -Please call neurologist and consider Neuro IR consult when patient arrives at [place]  2)IgG lambda multiple myeloma, stage III, del 17 p: -4 cycles of RVD from 11/01/2017 through 01/14/2018. -Stem cell [facility] on 02/20/2018 at [facility]. -Maintenance Velcade every 2 weeks and Revlimid 10 mg 3 weeks on/1 week off started on 07/29/2018. -Continue prophylaxis with acyclovir while on chemotherapy --Follows with Dr. Tera Helper  3)Diabetes (DM2)--- A1C, 5.7,  Hold Metformin due to contrast exposure  --Hold Trulicity while in the hospital - Use Novolog/Humalog Sliding scale insulin with Accu-Cheks/Fingersticks as ordered   4)HTN-  Hold Losartan, and hold Bumex due to contrast study and risks of dehydratin and AKI -Allow some permissive Hypertension due to acute stroke, avoid very aggressive, rapid BP control at this time.   5)H/o COVID-19--- was Positive 09/17/2019, Negative 12/23/19 AND also Neg on  12/26/19--- no respiratory symptoms at this time --- no Covid type symptoms at this time  Disposition/Need for in-Hospital Stay- patient unable to be discharged at this time due to -acute stroke requiring further neurology work-up and possible neuro IR intervention  Status is: Inpatient  Remains inpatient appropriate because:Inpatient level of care appropriate  due to severity of illness and acute stroke requiring further neurology work-up and possible neuro IR intervention   Dispo: The patient is from: Home              Anticipated d/c is to: Home               Anticipated d/c date is: 2 days              Patient currently is not medically stable to d/c. Barriers: Not Clinically Stable- acute stroke requiring further neurology work-up and possible neuro IR intervention  With History of - Reviewed by me  Past Medical History:  Diagnosis Date  . Acid reflux   . Allergic rhinitis   . Cancer ([place])    multiple myeloma  . Diabetes mellitus    type 2  . Gout   . Gout   . HBP (high blood pressure)   . History of kidney stones   . Migraines       Past Surgical History:  Procedure Laterality Date  . BREAST CYST EXCISION Left    2009 no visible scar on skin  . CESAREAN SECTION    . EXTRACORPOREAL SHOCK WAVE LITHOTRIPSY Left 10/10/2017   Procedure: LEFT EXTRACORPOREAL SHOCK WAVE LITHOTRIPSY (ESWL);  Surgeon: Bjorn Loser, MD;  Location: [facility];  Service: Urology;  Laterality: Left;  . EYE SURGERY    . HEMORRHOID SURGERY N/A 11/19/2012   Procedure: HEMORRHOIDECTOMY;  Surgeon: Jamesetta So, MD;  Location: [facility] ORS;  Service: General;  Laterality: N/A;  . kidney stones  1998  . LAPAROSCOPIC UNILATERAL SALPINGO OOPHERECTOMY  05/14/2012   Procedure: LAPAROSCOPIC UNILATERAL SALPINGO OOPHORECTOMY;  Surgeon: [name], MD;  Location: [facility] ORS;  Service: Gynecology;  Laterality: Right;  laparoscopic right salpingo-oophorectomy  . PARTIAL HYSTERECTOMY    . TONSILECTOMY, ADENOIDECTOMY, BILATERAL MYRINGOTOMY AND TUBES    . VESICOVAGINAL FISTULA CLOSURE W/ TAH        Chief Complaint  Patient presents with  . Code Stroke      HPI:    Joanna Reid  is a 54 y.o. female reformed smoker with PMHx of HTN, Morbid obesity, DM2,  As well as  history of multiple myeloma (s/p Stem cell Transplant 02/21/20 at [facility] and currently on Maintenance Velcade  every 2 weeks and Revlimid 10 mg 3 weeks on/1 week off started on 07/29/2018), who is status post screening colonoscopy on 12/25/2019 and presented with confusion and speech deficits primarily expressive aphasia on 12/26/19----cranial imaging shows 24 mL left parietal lobe watershed region area of ischemia without defined core infarct. -Telemetry neurologist recommends transfer to [facility] hospital for in-house neurology and possible IR consult due to Moderate distal small vessel disease in both the anterior and posterior circulation and  Moderate tortuosity of the cervical internal carotid arteries bilaterally, likely secondary to hypertension -Patient was out of the window for TPA  -Additional  history obtained from patient's daughter Joanna Reid at bedside, patient received Lipitor and aspirin   Review of systems:    In addition to the HPI above,   A full Review of  Systems was done, all other systems reviewed are negative except as noted above in HPI , .    Social History:  Reviewed by me    Social History   Tobacco Use  . Smoking status: Former Smoker    Quit date: 02/27/1999    Years since quitting: 20.8  . Smokeless tobacco: Never Used  . Tobacco comment: socially   Substance Use Topics  . Alcohol use: No    Alcohol/week: 0.0 standard drinks       Family History :  Reviewed by me    Family History  Problem Relation Age of Onset  . Arthritis Other   . Cancer Other   . Diabetes Other   . Hypertension Mother   . Dementia Mother   . Diabetes Father   . ALS Father   . Diabetes Brother   . Hypertension Brother   . Cancer Paternal Aunt   . COPD Maternal Grandmother   . Cancer Maternal Grandfather   . Anesthesia problems Paternal Grandfather      Home Medications:   Prior to Admission medications   Medication Sig Start Date End Date Taking? Authorizing Provider  acyclovir (ZOVIRAX) 400 MG tablet TAKE (1) TABLET BY MOUTH TWICE DAILY. 12/15/19  Yes Derek Jack,  MD  allopurinol (ZYLOPRIM) 300 MG tablet TAKE 1 TABLET DAILY Patient taking differently: Take 300 mg by mouth daily.  10/13/19  Yes Mikey Kirschner, MD  aspirin EC 81 MG tablet Take 1 tablet (81 mg total) by mouth daily. 12/26/19  Yes Rehman, Mechele Dawley, MD  bortezomib SQ (VELCADE) 3.5 MG SOLR Inject 1.2 mLs (3 mg total) into the skin every 14 (fourteen) days for 6 doses. 12/23/19 03/03/20 Yes Derek Jack, MD  carvedilol (COREG) 25 MG tablet Take 25 mg by mouth 2 (two) times daily. 05/05/19  Yes [provider]  diazepam (VALIUM) 5 MG tablet Take 1 tablet (5 mg total) by mouth at bedtime. 10/13/19  Yes Mikey Kirschner, MD  Docosanol (ABREVA) 10 % CREA Apply 1 application topically daily as needed (for nose).   Yes [provider]  docusate sodium (COLACE) 100 MG capsule Take 100 mg by mouth daily.    Yes [provider]  fluticasone (FLONASE) 50 MCG/ACT nasal spray Place 2 sprays into both nostrils daily. 09/17/19  Yes Wurst, [place], PA-C  lenalidomide (REVLIMID) 10 MG capsule TAKE 1 CAPSULE BY MOUTH  DAILY FOR 21 DAYS ON, THEN  7 DAYS OFF 12/16/19  Yes Derek Jack, MD  losartan (COZAAR) 25 MG tablet Take 25 mg by mouth in the morning and at bedtime.  04/22/19  Yes [provider]  Magnesium 200 MG TABS Take 600 mg by mouth daily.   Yes [provider]  metFORMIN (GLUCOPHAGE) 500 MG tablet TAKE 1 TABLET BY MOUTH ONCE DAILY WITH BREAKFAST Patient taking differently: Take 1,000 mg by mouth 2 (two) times daily with a meal.  07/21/18  Yes Mikey Kirschner, MD  omeprazole (PRILOSEC) 20 MG capsule Take 1 capsule (20 mg total) by mouth daily. 10/13/19  Yes Mikey Kirschner, MD  potassium chloride SA (KLOR-CON) 20 MEQ tablet TAKE (1) TABLET BY MOUTH TWICE DAILY. Patient taking differently: Take 20 mEq by mouth daily.  10/13/19  Yes Mikey Kirschner, MD  pregabalin [facility])  200 MG capsule TAKE (1) CAPSULE BY MOUTH TWICE DAILY. Patient taking  differently: Take 200 mg by mouth daily.  08/05/19  Yes Derek Jack, MD  Propylene Glycol (SYSTANE BALANCE) 0.6 % SOLN Apply 1 drop to eye daily as needed (dry eye).   Yes [provider]  rosuvastatin (CRESTOR) 5 MG tablet Take 5 mg by mouth daily.  05/14/18  Yes [provider]  valACYclovir (VALTREX) 1000 MG tablet TAKE 2 TABLETS NOW, THEN 2 TABLETS 12 HOURS LATER. 11/09/19  Yes Derek Jack, MD  albuterol (PROVENTIL HFA;VENTOLIN HFA) 108 (90 Base) MCG/ACT inhaler Inhale 2 puffs into the lungs every 4 (four) hours as needed for wheezing or shortness of breath. Patient not taking: Reported on 12/22/2019 08/11/18   Irwin Brakeman L, MD  bumetanide (BUMEX) 0.5 MG tablet Take 0.5 mg by mouth daily.  06/30/19   [provider]  cetirizine (ZYRTEC) 10 MG tablet Take 1 tablet (10 mg total) by mouth daily. Patient not taking: Reported on 12/26/2019 09/17/19   Wurst, [place], PA-C  HYDROcodone-acetaminophen (NORCO/VICODIN) 5-325 MG tablet Take 1 tablet by mouth at bedtime as needed for moderate pain. Patient not taking: Reported on 12/26/2019 10/16/18   Derek Jack, MD  magnesium oxide (MAG-OX) 400 (241.3 Mg) MG tablet Take 1 tablet (400 mg total) by mouth 3 (three) times daily. Patient not taking: Reported on 12/26/2019 10/02/18   Derek Jack, MD  TRULICITY 1.5 AO/1.3YQ SOPN Inject 0.75 mg into the skin every Monday.  05/14/18   [provider]     Allergies:    No Known Allergies   Physical Exam:   Vitals  Blood pressure (!) 156/80, pulse 70, temperature 98.7 F (37.1 C), temperature source Oral, resp. rate 14, height 5' 4"  (1.626 m), weight 114.9 kg, SpO2 100 %.  Physical Examination: General appearance - obese appearing, and in no distress  Mental status - somewhat lethargic, occasional confusional episodes, Eyes - sclera anicteric Neck - supple, no JVD elevation , Chest - clear  to auscultation bilaterally, symmetrical air  movement,  Heart - S1 and S2 normal, regular  Abdomen - soft, nontender, nondistended, increased truncal adiposity  Extremities - no pedal edema noted, intact peripheral pulses  Skin - warm, dry Neuro- Neurologial Exam: Mental Status: Patient is awake, somewhat lethargic, occasional confusional episodes,, oriented to person, place, month, year, and situation. -Expressive aphasia noted-some difficulty with word finding Cranial Nerves: II: Visual Fields are full. Pupils are equal, round, and reactive to light.   III,IV, VI: EOMI without ptosis or diploplia.  V: Facial sensation is symmetrical and wnl VII: Facial movement is symmetric.  VIII: hearing is intact to voice X: Uvula elevates symmetrically XI: Shoulder shrug is symmetric. XII: tongue is midline without atrophy or fasciculations.  Motor: Tone is normal. Bulk is normal. 5/5 strength was present in all four extremities.  Sensory: Sensation is normal to pinprick and to touch Cerebellar: FNF inconsistent     Data Review:    CBC Recent Labs  Lab 12/22/19 1040 12/26/19 1022 12/26/19 1031  WBC 2.9* 2.4*  --   HGB 11.4* 11.1* 11.6*  HCT 35.6* 34.5* 34.0*  PLT 153 104*  --   MCV 97.0 95.6  --   MCH 31.1 30.7  --   MCHC 32.0 32.2  --   RDW 15.6* 15.1  --   LYMPHSABS 1.0 0.9  --   MONOABS 0.5 0.4  --   EOSABS 0.1 0.1  --   BASOSABS 0.0 0.0  --    ------------------------------------------------------------------------------------------------------------------  Chemistries  Recent Labs  Lab 12/22/19 1040 12/26/19 1022 12/26/19 1031  NA 142 139 143  K 4.0 3.8 3.9  CL 104 106 105  CO2 27 25  --   GLUCOSE 108* 103* 103*  BUN 30* 16 14  CREATININE 1.22* 1.04* 1.00  CALCIUM 9.7 8.8*  --   MG 1.9  --   --   AST 20 29  --   ALT 19 26  --   ALKPHOS 60 65  --   BILITOT 0.7 0.6  --    ------------------------------------------------------------------------------------------------------------------ estimated  creatinine clearance is 79.1 mL/min (by C-G formula based on SCr of 1 mg/dL). ------------------------------------------------------------------------------------------------------------------ No results for input(s): TSH, T4TOTAL, T3FREE, THYROIDAB in the last 72 hours.  Invalid input(s): FREET3   Coagulation profile Recent Labs  Lab 12/26/19 1022  INR 1.0   ------------------------------------------------------------------------------------------------------------------- No results for input(s): DDIMER in the last 72 hours. -------------------------------------------------------------------------------------------------------------------  Cardiac Enzymes No results for input(s): CKMB, TROPONINI, MYOGLOBIN in the last 168 hours.  Invalid input(s): CK ------------------------------------------------------------------------------------------------------------------    Component Value Date/Time   BNP 40.0 08/09/2018 2057     ---------------------------------------------------------------------------------------------------------------  Urinalysis    Component Value Date/Time   COLORURINE YELLOW 12/26/2019 1022   APPEARANCEUR CLEAR 12/26/2019 1022   LABSPEC >1.046 (H) 12/26/2019 1022   PHURINE 5.0 12/26/2019 1022   GLUCOSEU NEGATIVE 12/26/2019 1022   HGBUR NEGATIVE 12/26/2019 1022   BILIRUBINUR NEGATIVE 12/26/2019 1022   BILIRUBINUR +++ 08/20/2018 0912   KETONESUR NEGATIVE 12/26/2019 1022   PROTEINUR NEGATIVE 12/26/2019 1022   UROBILINOGEN 0.2 05/07/2012 0829   NITRITE POSITIVE (A) 12/26/2019 1022   LEUKOCYTESUR NEGATIVE 12/26/2019 1022    ----------------------------------------------------------------------------------------------------------------   Imaging Results:    CT Code Stroke CTA Head W/WO contrast  Result Date: 12/26/2019 CLINICAL DATA:  Expressive aphasia. Last known normal at 11 p.m. last evening. EXAM: CT ANGIOGRAPHY HEAD AND NECK CT PERFUSION BRAIN  TECHNIQUE: Multidetector CT imaging of the head and neck was performed using the standard protocol during bolus administration of intravenous contrast. Multiplanar CT image reconstructions and MIPs were obtained to evaluate the vascular anatomy. Carotid stenosis measurements (when applicable) are obtained utilizing NASCET criteria, using the distal internal carotid diameter as the denominator. Multiphase CT imaging of the brain was performed following IV bolus contrast injection. Subsequent parametric perfusion maps were calculated using RAPID software. CONTRAST:  100 mL Omnipaque 350 COMPARISON:  CT head without contrast 12/26/2019 FINDINGS: CTA NECK FINDINGS Aortic arch: A 3 vessel arch configuration is present. Mild atherosclerotic changes are present without significant stenosis or aneurysm. Right carotid system: The right common carotid artery is within normal limits. Bifurcation is unremarkable. Moderate tortuosity is present in the cervical right ICA without significant stenosis. Left carotid system: The left common carotid artery is within normal limits. Atherosclerotic changes are noted at the bifurcation. Moderate tortuosity is present in the cervical left ICA without significant stenosis. Vertebral arteries: The vertebral arteries are codominant. Tortuosity is noted proximally without significant stenosis. Both vertebral arteries originate from the subclavian arteries. Atherosclerotic changes are noted on the right without significant stenosis. No significant stenosis is present in either vertebral artery in the neck. Skeleton: Multilevel degenerative changes are present cervical spine. Slight degenerative anterolisthesis is present at C2-3 and C3-4. Multilevel endplate changes are present at C4-5 most notably. No focal lytic or blastic lesions are present. Other neck: The soft tissues the neck otherwise within normal limits. No neck lesions are present. Salivary glands are within normal limits. Thyroid  is normal.  No significant adenopathy is present. Upper chest: The lung apices are clear. Thoracic inlet is within normal limits. Review of the MIP images confirms the above findings CTA HEAD FINDINGS Anterior circulation: Atherosclerotic calcifications are present within the cavernous internal carotid arteries bilaterally without significant stenosis. Terminal ICA is normal bilaterally. The A1 and M1 segments are normal. The anterior communicating artery is patent. MCA bifurcations are intact. Moderate attenuation of distal branch vessels is present without a significant proximal stenosis or occlusion. No aneurysm is present. Posterior circulation: The vertebral arteries are codominant. PICA origins are visualized and normal. The left V4 segment is hypoplastic. Vertebrobasilar junction is normal. Mild narrowing is present the mid basilar artery, to 50%. Both posterior cerebral arteries originate from basilar tip. The PCA branch vessels demonstrate distal attenuation without a significant proximal stenosis or occlusion. Venous sinuses: The dural sinuses are patent. The straight sinus deep cerebral veins are intact. Cortical veins are unremarkable. Anatomic variants: None Review of the MIP images confirms the above findings CT Brain Perfusion Findings: ASPECTS: 10/10 CBF (<30%) Volume: 40m Perfusion (Tmax>6.0s) volume: 058mMismatch Volume: 2424mnfarction Location:Left parietal lobe, watershed region. IMPRESSION: 1. 24 mL left parietal lobe watershed region area of ischemia without defined core infarct. 2. No emergent large vessel occlusion. 3. Mild narrowing of the mid basilar artery without other significant proximal stenosis, aneurysm, or branch vessel occlusion. 4. Moderate distal small vessel disease in both the anterior and posterior circulation. 5. Moderate tortuosity of the cervical internal carotid arteries bilaterally, likely secondary to hypertension. 6. Atherosclerotic changes at the left carotid  bifurcation and cavernous internal carotid arteries bilaterally without significant stenosis. 7. Multilevel degenerative changes in the cervical spine. These results were called by telephone at the time of interpretation on 12/26/2019 at 11:31 am to provider Dr. TriLangston Maskerho verbally acknowledged these results. Electronically Signed   By: ChrSan MorelleD.   On: 12/26/2019 11:31   CT Code Stroke CTA Neck W/WO contrast  Result Date: 12/26/2019 CLINICAL DATA:  Expressive aphasia. Last known normal at 11 p.m. last evening. EXAM: CT ANGIOGRAPHY HEAD AND NECK CT PERFUSION BRAIN TECHNIQUE: Multidetector CT imaging of the head and neck was performed using the standard protocol during bolus administration of intravenous contrast. Multiplanar CT image reconstructions and MIPs were obtained to evaluate the vascular anatomy. Carotid stenosis measurements (when applicable) are obtained utilizing NASCET criteria, using the distal internal carotid diameter as the denominator. Multiphase CT imaging of the brain was performed following IV bolus contrast injection. Subsequent parametric perfusion maps were calculated using RAPID software. CONTRAST:  100 mL Omnipaque 350 COMPARISON:  CT head without contrast 12/26/2019 FINDINGS: CTA NECK FINDINGS Aortic arch: A 3 vessel arch configuration is present. Mild atherosclerotic changes are present without significant stenosis or aneurysm. Right carotid system: The right common carotid artery is within normal limits. Bifurcation is unremarkable. Moderate tortuosity is present in the cervical right ICA without significant stenosis. Left carotid system: The left common carotid artery is within normal limits. Atherosclerotic changes are noted at the bifurcation. Moderate tortuosity is present in the cervical left ICA without significant stenosis. Vertebral arteries: The vertebral arteries are codominant. Tortuosity is noted proximally without significant stenosis. Both vertebral  arteries originate from the subclavian arteries. Atherosclerotic changes are noted on the right without significant stenosis. No significant stenosis is present in either vertebral artery in the neck. Skeleton: Multilevel degenerative changes are present cervical spine. Slight degenerative anterolisthesis is present at C2-3 and C3-4. Multilevel endplate changes are present at  C4-5 most notably. No focal lytic or blastic lesions are present. Other neck: The soft tissues the neck otherwise within normal limits. No neck lesions are present. Salivary glands are within normal limits. Thyroid is normal. No significant adenopathy is present. Upper chest: The lung apices are clear. Thoracic inlet is within normal limits. Review of the MIP images confirms the above findings CTA HEAD FINDINGS Anterior circulation: Atherosclerotic calcifications are present within the cavernous internal carotid arteries bilaterally without significant stenosis. Terminal ICA is normal bilaterally. The A1 and M1 segments are normal. The anterior communicating artery is patent. MCA bifurcations are intact. Moderate attenuation of distal branch vessels is present without a significant proximal stenosis or occlusion. No aneurysm is present. Posterior circulation: The vertebral arteries are codominant. PICA origins are visualized and normal. The left V4 segment is hypoplastic. Vertebrobasilar junction is normal. Mild narrowing is present the mid basilar artery, to 50%. Both posterior cerebral arteries originate from basilar tip. The PCA branch vessels demonstrate distal attenuation without a significant proximal stenosis or occlusion. Venous sinuses: The dural sinuses are patent. The straight sinus deep cerebral veins are intact. Cortical veins are unremarkable. Anatomic variants: None Review of the MIP images confirms the above findings CT Brain Perfusion Findings: ASPECTS: 10/10 CBF (<30%) Volume: 61m Perfusion (Tmax>6.0s) volume: 017mMismatch  Volume: 2458mnfarction Location:Left parietal lobe, watershed region. IMPRESSION: 1. 24 mL left parietal lobe watershed region area of ischemia without defined core infarct. 2. No emergent large vessel occlusion. 3. Mild narrowing of the mid basilar artery without other significant proximal stenosis, aneurysm, or branch vessel occlusion. 4. Moderate distal small vessel disease in both the anterior and posterior circulation. 5. Moderate tortuosity of the cervical internal carotid arteries bilaterally, likely secondary to hypertension. 6. Atherosclerotic changes at the left carotid bifurcation and cavernous internal carotid arteries bilaterally without significant stenosis. 7. Multilevel degenerative changes in the cervical spine. These results were called by telephone at the time of interpretation on 12/26/2019 at 11:31 am to provider Dr. TriLangston Maskerho verbally acknowledged these results. Electronically Signed   By: ChrSan MorelleD.   On: 12/26/2019 11:31   CT Code Stroke Cerebral Perfusion with contrast  Result Date: 12/26/2019 CLINICAL DATA:  Expressive aphasia. Last known normal at 11 p.m. last evening. EXAM: CT ANGIOGRAPHY HEAD AND NECK CT PERFUSION BRAIN TECHNIQUE: Multidetector CT imaging of the head and neck was performed using the standard protocol during bolus administration of intravenous contrast. Multiplanar CT image reconstructions and MIPs were obtained to evaluate the vascular anatomy. Carotid stenosis measurements (when applicable) are obtained utilizing NASCET criteria, using the distal internal carotid diameter as the denominator. Multiphase CT imaging of the brain was performed following IV bolus contrast injection. Subsequent parametric perfusion maps were calculated using RAPID software. CONTRAST:  100 mL Omnipaque 350 COMPARISON:  CT head without contrast 12/26/2019 FINDINGS: CTA NECK FINDINGS Aortic arch: A 3 vessel arch configuration is present. Mild atherosclerotic changes are  present without significant stenosis or aneurysm. Right carotid system: The right common carotid artery is within normal limits. Bifurcation is unremarkable. Moderate tortuosity is present in the cervical right ICA without significant stenosis. Left carotid system: The left common carotid artery is within normal limits. Atherosclerotic changes are noted at the bifurcation. Moderate tortuosity is present in the cervical left ICA without significant stenosis. Vertebral arteries: The vertebral arteries are codominant. Tortuosity is noted proximally without significant stenosis. Both vertebral arteries originate from the subclavian arteries. Atherosclerotic changes are noted on the right without significant  stenosis. No significant stenosis is present in either vertebral artery in the neck. Skeleton: Multilevel degenerative changes are present cervical spine. Slight degenerative anterolisthesis is present at C2-3 and C3-4. Multilevel endplate changes are present at C4-5 most notably. No focal lytic or blastic lesions are present. Other neck: The soft tissues the neck otherwise within normal limits. No neck lesions are present. Salivary glands are within normal limits. Thyroid is normal. No significant adenopathy is present. Upper chest: The lung apices are clear. Thoracic inlet is within normal limits. Review of the MIP images confirms the above findings CTA HEAD FINDINGS Anterior circulation: Atherosclerotic calcifications are present within the cavernous internal carotid arteries bilaterally without significant stenosis. Terminal ICA is normal bilaterally. The A1 and M1 segments are normal. The anterior communicating artery is patent. MCA bifurcations are intact. Moderate attenuation of distal branch vessels is present without a significant proximal stenosis or occlusion. No aneurysm is present. Posterior circulation: The vertebral arteries are codominant. PICA origins are visualized and normal. The left V4 segment is  hypoplastic. Vertebrobasilar junction is normal. Mild narrowing is present the mid basilar artery, to 50%. Both posterior cerebral arteries originate from basilar tip. The PCA branch vessels demonstrate distal attenuation without a significant proximal stenosis or occlusion. Venous sinuses: The dural sinuses are patent. The straight sinus deep cerebral veins are intact. Cortical veins are unremarkable. Anatomic variants: None Review of the MIP images confirms the above findings CT Brain Perfusion Findings: ASPECTS: 10/10 CBF (<30%) Volume: 69m Perfusion (Tmax>6.0s) volume: 067mMismatch Volume: 2418mnfarction Location:Left parietal lobe, watershed region. IMPRESSION: 1. 24 mL left parietal lobe watershed region area of ischemia without defined core infarct. 2. No emergent large vessel occlusion. 3. Mild narrowing of the mid basilar artery without other significant proximal stenosis, aneurysm, or branch vessel occlusion. 4. Moderate distal small vessel disease in both the anterior and posterior circulation. 5. Moderate tortuosity of the cervical internal carotid arteries bilaterally, likely secondary to hypertension. 6. Atherosclerotic changes at the left carotid bifurcation and cavernous internal carotid arteries bilaterally without significant stenosis. 7. Multilevel degenerative changes in the cervical spine. These results were called by telephone at the time of interpretation on 12/26/2019 at 11:31 am to provider Dr. TriLangston Maskerho verbally acknowledged these results. Electronically Signed   By: ChrSan MorelleD.   On: 12/26/2019 11:31   ECHOCARDIOGRAM COMPLETE  Result Date: 12/26/2019    ECHOCARDIOGRAM REPORT   Patient Name:   Joanna ARONte of Exam: 12/26/2019 Medical Rec #:  [id number]     Height:       64.0 in Accession #:    [id number]    Weight:       253.3 lb Date of Birth:  2/1June 12, 1967     BSA:          2.163 m Patient [age]:    [age]         BP:           149/74 mmHg Patient  Gender: F                HR:           52 bpm. [facility]: 2D Echo Indications:    stroke 434.91  History:        Patient has prior history of Echocardiogram examinations, most                 recent  12/08/2014. Risk Factors:Diabetes and Former Smoker. High                 blood pressure. cancer.  Sonographer:    Jannett Celestine RDCS (AE) Referring Phys: [id number] [facility]  Sonographer Comments: Image acquisition challenging due to patient body habitus. limited mobility IMPRESSIONS  1. Left ventricular ejection fraction, by estimation, is 60 to 65%. The left ventricle has normal function. The left ventricle has no regional wall motion abnormalities. There is mild left ventricular hypertrophy of the basal-septal segment. Left ventricular diastolic function could not be evaluated.  2. Right ventricular systolic function is normal. The right ventricular size is normal. Tricuspid regurgitation signal is inadequate for assessing PA pressure.  3. The mitral valve is normal in structure. Trivial mitral valve regurgitation. No evidence of mitral stenosis.  4. The aortic valve is normal in structure. Aortic valve regurgitation is trivial. No aortic stenosis is present. FINDINGS  Left Ventricle: Left ventricular ejection fraction, by estimation, is 60 to 65%. The left ventricle has normal function. The left ventricle has no regional wall motion abnormalities. The left ventricular internal cavity size was normal in size. There is  mild left ventricular hypertrophy of the basal-septal segment. Left ventricular diastolic function could not be evaluated. Right Ventricle: The right ventricular size is normal. No increase in right ventricular wall thickness. Right ventricular systolic function is normal. Tricuspid regurgitation signal is inadequate for assessing PA pressure. Left Atrium: Left atrial size was normal in size. Right Atrium: Right atrial size was normal in size. Pericardium: There is no  evidence of pericardial effusion. Mitral Valve: The mitral valve is normal in structure. Normal mobility of the mitral valve leaflets. Trivial mitral valve regurgitation. No evidence of mitral valve stenosis. Tricuspid Valve: The tricuspid valve is normal in structure. Tricuspid valve regurgitation is trivial. No evidence of tricuspid stenosis. Aortic Valve: The aortic valve is normal in structure. Aortic valve regurgitation is trivial. No aortic stenosis is present. Pulmonic Valve: The pulmonic valve was normal in structure. Pulmonic valve regurgitation is not visualized. No evidence of pulmonic stenosis. Aorta: The aortic root is normal in size and structure. Venous: The inferior vena cava was not well visualized. IAS/Shunts: No atrial level shunt detected by color flow Doppler.  LEFT VENTRICLE PLAX 2D LVIDd:         4.91 cm LVIDs:         2.93 cm LV PW:         0.99 cm LV IVS:        1.18 cm LVOT diam:     2.10 cm LV SV:         58 LV SV Index:   27 LVOT Area:     3.46 cm  RIGHT VENTRICLE TAPSE (M-mode): 2.9 cm LEFT ATRIUM             Index LA diam:        3.80 cm 1.76 cm/m LA Vol (A2C):   85.0 ml 39.30 ml/m LA Vol (A4C):   56.1 ml 25.94 ml/m LA Biplane Vol: 72.8 ml 33.66 ml/m  AORTIC VALVE LVOT Vmax:   70.50 cm/s LVOT Vmean:  55.200 cm/s LVOT VTI:    0.167 m  AORTA Ao Root diam: 3.40 cm MITRAL VALVE MV Area (PHT): 1.86 cm    SHUNTS MV Decel Time: 407 msec    Systemic VTI:  0.17 m MV E velocity: 61.30 cm/s  Systemic Diam: 2.10 cm MV A velocity: 45.00 cm/s MV E/A ratio:  1.36 Fransico Him MD Electronically signed by Fransico Him MD Signature Date/Time: 12/26/2019/4:47:12 PM    Final    CT HEAD CODE STROKE WO CONTRAST  Result Date: 12/26/2019 CLINICAL DATA:  Code stroke. Sudden onset of abnormal speech beginning this morning. Last known normal was 11 p.m. last night. EXAM: CT HEAD WITHOUT CONTRAST TECHNIQUE: Contiguous axial images were obtained from the base of the skull through the vertex without  intravenous contrast. COMPARISON:  CT maxillofacial 04/22/2016 FINDINGS: Brain: No acute infarct, hemorrhage, or mass lesion is present. Basal ganglia are normal. Insular ribbon is normal bilaterally. No significant white matter lesions are present. No focal cortical lesions are present. The brainstem and cerebellum are within normal limits. Vascular: No hyperdense vessel or unexpected calcification. Skull: Calvarium is intact. No focal lytic or blastic lesions are present. No significant extracranial soft tissue lesion is present. Sinuses/Orbits: The right maxillary sinus is shrunken suggesting a history of chronic disease. No active disease is present. The paranasal sinuses and mastoid air cells are otherwise clear. The globes and orbits are within normal limits. ASPECTS [facility] Stroke Program Early CT Score) - Ganglionic level infarction (caudate, lentiform nuclei, internal capsule, insula, M1-M3 cortex): 7/7 - Supraganglionic infarction (M4-M6 cortex): 3/3 Total score (0-10 with 10 being normal): 10/10 IMPRESSION: 1. Normal CT appearance of the brain. 2. ASPECTS is 10/10. These results were called by telephone at the time of interpretation on 12/26/2019 at 10:35 am to provider [facility] , who verbally acknowledged these results. Electronically Signed   By: San Morelle M.D.   On: 12/26/2019 10:35    Radiological Exams on Admission: CT Code Stroke CTA Head W/WO contrast  Result Date: 12/26/2019 CLINICAL DATA:  Expressive aphasia. Last known normal at 11 p.m. last evening. EXAM: CT ANGIOGRAPHY HEAD AND NECK CT PERFUSION BRAIN TECHNIQUE: Multidetector CT imaging of the head and neck was performed using the standard protocol during bolus administration of intravenous contrast. Multiplanar CT image reconstructions and MIPs were obtained to evaluate the vascular anatomy. Carotid stenosis measurements (when applicable) are obtained utilizing NASCET criteria, using the distal internal carotid diameter  as the denominator. Multiphase CT imaging of the brain was performed following IV bolus contrast injection. Subsequent parametric perfusion maps were calculated using RAPID software. CONTRAST:  100 mL Omnipaque 350 COMPARISON:  CT head without contrast 12/26/2019 FINDINGS: CTA NECK FINDINGS Aortic arch: A 3 vessel arch configuration is present. Mild atherosclerotic changes are present without significant stenosis or aneurysm. Right carotid system: The right common carotid artery is within normal limits. Bifurcation is unremarkable. Moderate tortuosity is present in the cervical right ICA without significant stenosis. Left carotid system: The left common carotid artery is within normal limits. Atherosclerotic changes are noted at the bifurcation. Moderate tortuosity is present in the cervical left ICA without significant stenosis. Vertebral arteries: The vertebral arteries are codominant. Tortuosity is noted proximally without significant stenosis. Both vertebral arteries originate from the subclavian arteries. Atherosclerotic changes are noted on the right without significant stenosis. No significant stenosis is present in either vertebral artery in the neck. Skeleton: Multilevel degenerative changes are present cervical spine. Slight degenerative anterolisthesis is present at C2-3 and C3-4. Multilevel endplate changes are present at C4-5 most notably. No focal lytic or blastic lesions are present. Other neck: The soft tissues the neck otherwise within normal limits. No neck lesions are present. Salivary glands are within normal limits. Thyroid is normal. No significant adenopathy is present. Upper chest: The lung apices are clear. Thoracic inlet is within  normal limits. Review of the MIP images confirms the above findings CTA HEAD FINDINGS Anterior circulation: Atherosclerotic calcifications are present within the cavernous internal carotid arteries bilaterally without significant stenosis. Terminal ICA is normal  bilaterally. The A1 and M1 segments are normal. The anterior communicating artery is patent. MCA bifurcations are intact. Moderate attenuation of distal branch vessels is present without a significant proximal stenosis or occlusion. No aneurysm is present. Posterior circulation: The vertebral arteries are codominant. PICA origins are visualized and normal. The left V4 segment is hypoplastic. Vertebrobasilar junction is normal. Mild narrowing is present the mid basilar artery, to 50%. Both posterior cerebral arteries originate from basilar tip. The PCA branch vessels demonstrate distal attenuation without a significant proximal stenosis or occlusion. Venous sinuses: The dural sinuses are patent. The straight sinus deep cerebral veins are intact. Cortical veins are unremarkable. Anatomic variants: None Review of the MIP images confirms the above findings CT Brain Perfusion Findings: ASPECTS: 10/10 CBF (<30%) Volume: 69m Perfusion (Tmax>6.0s) volume: 042mMismatch Volume: 248mnfarction Location:Left parietal lobe, watershed region. IMPRESSION: 1. 24 mL left parietal lobe watershed region area of ischemia without defined core infarct. 2. No emergent large vessel occlusion. 3. Mild narrowing of the mid basilar artery without other significant proximal stenosis, aneurysm, or branch vessel occlusion. 4. Moderate distal small vessel disease in both the anterior and posterior circulation. 5. Moderate tortuosity of the cervical internal carotid arteries bilaterally, likely secondary to hypertension. 6. Atherosclerotic changes at the left carotid bifurcation and cavernous internal carotid arteries bilaterally without significant stenosis. 7. Multilevel degenerative changes in the cervical spine. These results were called by telephone at the time of interpretation on 12/26/2019 at 11:31 am to provider Dr. TriLangston Maskerho verbally acknowledged these results. Electronically Signed   By: ChrSan MorelleD.   On: 12/26/2019  11:31   CT Code Stroke CTA Neck W/WO contrast  Result Date: 12/26/2019 CLINICAL DATA:  Expressive aphasia. Last known normal at 11 p.m. last evening. EXAM: CT ANGIOGRAPHY HEAD AND NECK CT PERFUSION BRAIN TECHNIQUE: Multidetector CT imaging of the head and neck was performed using the standard protocol during bolus administration of intravenous contrast. Multiplanar CT image reconstructions and MIPs were obtained to evaluate the vascular anatomy. Carotid stenosis measurements (when applicable) are obtained utilizing NASCET criteria, using the distal internal carotid diameter as the denominator. Multiphase CT imaging of the brain was performed following IV bolus contrast injection. Subsequent parametric perfusion maps were calculated using RAPID software. CONTRAST:  100 mL Omnipaque 350 COMPARISON:  CT head without contrast 12/26/2019 FINDINGS: CTA NECK FINDINGS Aortic arch: A 3 vessel arch configuration is present. Mild atherosclerotic changes are present without significant stenosis or aneurysm. Right carotid system: The right common carotid artery is within normal limits. Bifurcation is unremarkable. Moderate tortuosity is present in the cervical right ICA without significant stenosis. Left carotid system: The left common carotid artery is within normal limits. Atherosclerotic changes are noted at the bifurcation. Moderate tortuosity is present in the cervical left ICA without significant stenosis. Vertebral arteries: The vertebral arteries are codominant. Tortuosity is noted proximally without significant stenosis. Both vertebral arteries originate from the subclavian arteries. Atherosclerotic changes are noted on the right without significant stenosis. No significant stenosis is present in either vertebral artery in the neck. Skeleton: Multilevel degenerative changes are present cervical spine. Slight degenerative anterolisthesis is present at C2-3 and C3-4. Multilevel endplate changes are present at C4-5  most notably. No focal lytic or blastic lesions are present. Other neck: The soft  tissues the neck otherwise within normal limits. No neck lesions are present. Salivary glands are within normal limits. Thyroid is normal. No significant adenopathy is present. Upper chest: The lung apices are clear. Thoracic inlet is within normal limits. Review of the MIP images confirms the above findings CTA HEAD FINDINGS Anterior circulation: Atherosclerotic calcifications are present within the cavernous internal carotid arteries bilaterally without significant stenosis. Terminal ICA is normal bilaterally. The A1 and M1 segments are normal. The anterior communicating artery is patent. MCA bifurcations are intact. Moderate attenuation of distal branch vessels is present without a significant proximal stenosis or occlusion. No aneurysm is present. Posterior circulation: The vertebral arteries are codominant. PICA origins are visualized and normal. The left V4 segment is hypoplastic. Vertebrobasilar junction is normal. Mild narrowing is present the mid basilar artery, to 50%. Both posterior cerebral arteries originate from basilar tip. The PCA branch vessels demonstrate distal attenuation without a significant proximal stenosis or occlusion. Venous sinuses: The dural sinuses are patent. The straight sinus deep cerebral veins are intact. Cortical veins are unremarkable. Anatomic variants: None Review of the MIP images confirms the above findings CT Brain Perfusion Findings: ASPECTS: 10/10 CBF (<30%) Volume: 63m Perfusion (Tmax>6.0s) volume: 057mMismatch Volume: 2434mnfarction Location:Left parietal lobe, watershed region. IMPRESSION: 1. 24 mL left parietal lobe watershed region area of ischemia without defined core infarct. 2. No emergent large vessel occlusion. 3. Mild narrowing of the mid basilar artery without other significant proximal stenosis, aneurysm, or branch vessel occlusion. 4. Moderate distal small vessel disease in  both the anterior and posterior circulation. 5. Moderate tortuosity of the cervical internal carotid arteries bilaterally, likely secondary to hypertension. 6. Atherosclerotic changes at the left carotid bifurcation and cavernous internal carotid arteries bilaterally without significant stenosis. 7. Multilevel degenerative changes in the cervical spine. These results were called by telephone at the time of interpretation on 12/26/2019 at 11:31 am to provider Dr. TriLangston Maskerho verbally acknowledged these results. Electronically Signed   By: ChrSan MorelleD.   On: 12/26/2019 11:31   CT Code Stroke Cerebral Perfusion with contrast  Result Date: 12/26/2019 CLINICAL DATA:  Expressive aphasia. Last known normal at 11 p.m. last evening. EXAM: CT ANGIOGRAPHY HEAD AND NECK CT PERFUSION BRAIN TECHNIQUE: Multidetector CT imaging of the head and neck was performed using the standard protocol during bolus administration of intravenous contrast. Multiplanar CT image reconstructions and MIPs were obtained to evaluate the vascular anatomy. Carotid stenosis measurements (when applicable) are obtained utilizing NASCET criteria, using the distal internal carotid diameter as the denominator. Multiphase CT imaging of the brain was performed following IV bolus contrast injection. Subsequent parametric perfusion maps were calculated using RAPID software. CONTRAST:  100 mL Omnipaque 350 COMPARISON:  CT head without contrast 12/26/2019 FINDINGS: CTA NECK FINDINGS Aortic arch: A 3 vessel arch configuration is present. Mild atherosclerotic changes are present without significant stenosis or aneurysm. Right carotid system: The right common carotid artery is within normal limits. Bifurcation is unremarkable. Moderate tortuosity is present in the cervical right ICA without significant stenosis. Left carotid system: The left common carotid artery is within normal limits. Atherosclerotic changes are noted at the bifurcation. Moderate  tortuosity is present in the cervical left ICA without significant stenosis. Vertebral arteries: The vertebral arteries are codominant. Tortuosity is noted proximally without significant stenosis. Both vertebral arteries originate from the subclavian arteries. Atherosclerotic changes are noted on the right without significant stenosis. No significant stenosis is present in either vertebral artery in the neck. Skeleton: Multilevel  degenerative changes are present cervical spine. Slight degenerative anterolisthesis is present at C2-3 and C3-4. Multilevel endplate changes are present at C4-5 most notably. No focal lytic or blastic lesions are present. Other neck: The soft tissues the neck otherwise within normal limits. No neck lesions are present. Salivary glands are within normal limits. Thyroid is normal. No significant adenopathy is present. Upper chest: The lung apices are clear. Thoracic inlet is within normal limits. Review of the MIP images confirms the above findings CTA HEAD FINDINGS Anterior circulation: Atherosclerotic calcifications are present within the cavernous internal carotid arteries bilaterally without significant stenosis. Terminal ICA is normal bilaterally. The A1 and M1 segments are normal. The anterior communicating artery is patent. MCA bifurcations are intact. Moderate attenuation of distal branch vessels is present without a significant proximal stenosis or occlusion. No aneurysm is present. Posterior circulation: The vertebral arteries are codominant. PICA origins are visualized and normal. The left V4 segment is hypoplastic. Vertebrobasilar junction is normal. Mild narrowing is present the mid basilar artery, to 50%. Both posterior cerebral arteries originate from basilar tip. The PCA branch vessels demonstrate distal attenuation without a significant proximal stenosis or occlusion. Venous sinuses: The dural sinuses are patent. The straight sinus deep cerebral veins are intact. Cortical  veins are unremarkable. Anatomic variants: None Review of the MIP images confirms the above findings CT Brain Perfusion Findings: ASPECTS: 10/10 CBF (<30%) Volume: 4m Perfusion (Tmax>6.0s) volume: 040mMismatch Volume: 2498mnfarction Location:Left parietal lobe, watershed region. IMPRESSION: 1. 24 mL left parietal lobe watershed region area of ischemia without defined core infarct. 2. No emergent large vessel occlusion. 3. Mild narrowing of the mid basilar artery without other significant proximal stenosis, aneurysm, or branch vessel occlusion. 4. Moderate distal small vessel disease in both the anterior and posterior circulation. 5. Moderate tortuosity of the cervical internal carotid arteries bilaterally, likely secondary to hypertension. 6. Atherosclerotic changes at the left carotid bifurcation and cavernous internal carotid arteries bilaterally without significant stenosis. 7. Multilevel degenerative changes in the cervical spine. These results were called by telephone at the time of interpretation on 12/26/2019 at 11:31 am to provider Dr. TriLangston Maskerho verbally acknowledged these results. Electronically Signed   By: ChrSan MorelleD.   On: 12/26/2019 11:31   ECHOCARDIOGRAM COMPLETE  Result Date: 12/26/2019    ECHOCARDIOGRAM REPORT   Patient Name:   Joanna TAMASte of Exam: 12/26/2019 Medical Rec #:  [id number]     Height:       64.0 in Accession #:    [id number]    Weight:       253.3 lb Date of Birth:  [birth year]     BSA:          2.163 m Patient Age:    [age]         BP:           149/74 mmHg Patient Gender: F                HR:           52 bpm. Exam Location:  [facility]: 2D Echo Indications:    stroke 434.91  History:        Patient has prior history of Echocardiogram examinations, most                 recent 12/08/2014. Risk Factors:Diabetes and Former Smoker. High  blood pressure. cancer.  Sonographer:    Jannett Celestine RDCS (AE) Referring Phys: [id number]  [facility]  Sonographer Comments: Image acquisition challenging due to patient body habitus. limited mobility IMPRESSIONS  1. Left ventricular ejection fraction, by estimation, is 60 to 65%. The left ventricle has normal function. The left ventricle has no regional wall motion abnormalities. There is mild left ventricular hypertrophy of the basal-septal segment. Left ventricular diastolic function could not be evaluated.  2. Right ventricular systolic function is normal. The right ventricular size is normal. Tricuspid regurgitation signal is inadequate for assessing PA pressure.  3. The mitral valve is normal in structure. Trivial mitral valve regurgitation. No evidence of mitral stenosis.  4. The aortic valve is normal in structure. Aortic valve regurgitation is trivial. No aortic stenosis is present. FINDINGS  Left Ventricle: Left ventricular ejection fraction, by estimation, is 60 to 65%. The left ventricle has normal function. The left ventricle has no regional wall motion abnormalities. The left ventricular internal cavity size was normal in size. There is  mild left ventricular hypertrophy of the basal-septal segment. Left ventricular diastolic function could not be evaluated. Right Ventricle: The right ventricular size is normal. No increase in right ventricular wall thickness. Right ventricular systolic function is normal. Tricuspid regurgitation signal is inadequate for assessing PA pressure. Left Atrium: Left atrial size was normal in size. Right Atrium: Right atrial size was normal in size. Pericardium: There is no evidence of pericardial effusion. Mitral Valve: The mitral valve is normal in structure. Normal mobility of the mitral valve leaflets. Trivial mitral valve regurgitation. No evidence of mitral valve stenosis. Tricuspid Valve: The tricuspid valve is normal in structure. Tricuspid valve regurgitation is trivial. No evidence of tricuspid stenosis. Aortic Valve: The aortic valve is normal in  structure. Aortic valve regurgitation is trivial. No aortic stenosis is present. Pulmonic Valve: The pulmonic valve was normal in structure. Pulmonic valve regurgitation is not visualized. No evidence of pulmonic stenosis. Aorta: The aortic root is normal in size and structure. Venous: The inferior vena cava was not well visualized. IAS/Shunts: No atrial level shunt detected by color flow Doppler.  LEFT VENTRICLE PLAX 2D LVIDd:         4.91 cm LVIDs:         2.93 cm LV PW:         0.99 cm LV IVS:        1.18 cm LVOT diam:     2.10 cm LV SV:         58 LV SV Index:   27 LVOT Area:     3.46 cm  RIGHT VENTRICLE TAPSE (M-mode): 2.9 cm LEFT ATRIUM             Index LA diam:        3.80 cm 1.76 cm/m LA Vol (A2C):   85.0 ml 39.30 ml/m LA Vol (A4C):   56.1 ml 25.94 ml/m LA Biplane Vol: 72.8 ml 33.66 ml/m  AORTIC VALVE LVOT Vmax:   70.50 cm/s LVOT Vmean:  55.200 cm/s LVOT VTI:    0.167 m  AORTA Ao Root diam: 3.40 cm MITRAL VALVE MV Area (PHT): 1.86 cm    SHUNTS MV Decel Time: 407 msec    Systemic VTI:  0.17 m MV E velocity: 61.30 cm/s  Systemic Diam: 2.10 cm MV A velocity: 45.00 cm/s MV E/A ratio:  1.36 Fransico Him MD Electronically signed by Fransico Him MD Signature Date/Time: 12/26/2019/4:47:12 PM    Final  CT HEAD CODE STROKE WO CONTRAST  Result Date: 12/26/2019 CLINICAL DATA:  Code stroke. Sudden onset of abnormal speech beginning this morning. Last known normal was 11 p.m. last night. EXAM: CT HEAD WITHOUT CONTRAST TECHNIQUE: Contiguous axial images were obtained from the base of the skull through the vertex without intravenous contrast. COMPARISON:  CT maxillofacial 04/22/2016 FINDINGS: Brain: No acute infarct, hemorrhage, or mass lesion is present. Basal ganglia are normal. Insular ribbon is normal bilaterally. No significant white matter lesions are present. No focal cortical lesions are present. The brainstem and cerebellum are within normal limits. Vascular: No hyperdense vessel or unexpected  calcification. Skull: Calvarium is intact. No focal lytic or blastic lesions are present. No significant extracranial soft tissue lesion is present. Sinuses/Orbits: The right maxillary sinus is shrunken suggesting a history of chronic disease. No active disease is present. The paranasal sinuses and mastoid air cells are otherwise clear. The globes and orbits are within normal limits. ASPECTS [facility] Stroke Program Early CT Score) - Ganglionic level infarction (caudate, lentiform nuclei, internal capsule, insula, M1-M3 cortex): 7/7 - Supraganglionic infarction (M4-M6 cortex): 3/3 Total score (0-10 with 10 being normal): 10/10 IMPRESSION: 1. Normal CT appearance of the brain. 2. ASPECTS is 10/10. These results were called by telephone at the time of interpretation on 12/26/2019 at 10:35 am to provider [facility] , who verbally acknowledged these results. Electronically Signed   By: San Morelle M.D.   On: 12/26/2019 10:35    DVT Prophylaxis -SCD  /heparin AM Labs Ordered, also please review Full Orders  Family Communication: Admission, patients condition and plan of care including tests being ordered have been discussed with the patient and daughter Joanna Reid who indicate understanding and agree with the plan   Code Status - Full Code  Likely DC to  Home after neuro work-up  Condition   stable  Roxan Hockey M.D on 12/26/2019 at 6:41 PM Go to [email or web address] -  for contact info  [facility] - Office  [phone]

## 2019-12-27 ENCOUNTER — Inpatient Hospital Stay (HOSPITAL_COMMUNITY): Payer: BC Managed Care – PPO

## 2019-12-27 DIAGNOSIS — I639 Cerebral infarction, unspecified: Secondary | ICD-10-CM

## 2019-12-27 LAB — CBC
HCT: 30.6 % — ABNORMAL LOW (ref 36.0–46.0)
Hemoglobin: 9.8 g/dL — ABNORMAL LOW (ref 12.0–15.0)
MCH: 30.2 pg (ref 26.0–34.0)
MCHC: 32 g/dL (ref 30.0–36.0)
MCV: 94.4 fL (ref 80.0–100.0)
Platelets: 79 10*3/uL — ABNORMAL LOW (ref 150–400)
RBC: 3.24 MIL/uL — ABNORMAL LOW (ref 3.87–5.11)
RDW: 14.9 % (ref 11.5–15.5)
WBC: 1.9 10*3/uL — ABNORMAL LOW (ref 4.0–10.5)
nRBC: 0 % (ref 0.0–0.2)

## 2019-12-27 LAB — BASIC METABOLIC PANEL
Anion gap: 10 (ref 5–15)
BUN: 11 mg/dL (ref 6–20)
CO2: 23 mmol/L (ref 22–32)
Calcium: 8.7 mg/dL — ABNORMAL LOW (ref 8.9–10.3)
Chloride: 107 mmol/L (ref 98–111)
Creatinine, Ser: 0.99 mg/dL (ref 0.44–1.00)
GFR calc Af Amer: >60 mL/min (ref >60)
GFR calc non Af Amer: >60 mL/min (ref >60)
Glucose, Bld: 103 mg/dL — ABNORMAL HIGH (ref 70–99)
Potassium: 3.4 mmol/L — ABNORMAL LOW (ref 3.5–5.1)
Sodium: 140 mmol/L (ref 135–145)

## 2019-12-27 LAB — HEMOGLOBIN A1C
Hgb A1c MFr Bld: 5.4 % (ref 4.8–5.6)
Mean Plasma Glucose: 108.28 mg/dL

## 2019-12-27 LAB — GLUCOSE, CAPILLARY
Glucose-Capillary: 121 mg/dL — ABNORMAL HIGH (ref 70–99)
Glucose-Capillary: 83 mg/dL (ref 70–99)
Glucose-Capillary: 85 mg/dL (ref 70–99)
Glucose-Capillary: 88 mg/dL (ref 70–99)
Glucose-Capillary: 91 mg/dL (ref 70–99)

## 2019-12-27 LAB — HIV ANTIBODY (ROUTINE TESTING W REFLEX): HIV Screen 4th Generation wRfx: NONREACTIVE

## 2019-12-27 LAB — MRSA PCR SCREENING: MRSA by PCR: POSITIVE — AB

## 2019-12-27 MED ORDER — MUPIROCIN 2 % EX OINT
1.0000 "application " | TOPICAL_OINTMENT | Freq: Two times a day (BID) | CUTANEOUS | Status: DC
  Administered 2019-12-27 – 2019-12-28 (×3): 1 via NASAL
  Filled 2019-12-27: qty 22

## 2019-12-27 MED ORDER — PREGABALIN 100 MG PO CAPS
200.0000 mg | ORAL_CAPSULE | Freq: Every day | ORAL | Status: DC
  Administered 2019-12-27 – 2019-12-28 (×2): 200 mg via ORAL
  Filled 2019-12-27 (×2): qty 2

## 2019-12-27 MED ORDER — HEPARIN SODIUM (PORCINE) 5000 UNIT/ML IJ SOLN
5000.0000 [IU] | Freq: Three times a day (TID) | INTRAMUSCULAR | Status: DC
  Administered 2019-12-27 – 2019-12-28 (×4): 5000 [IU] via SUBCUTANEOUS
  Filled 2019-12-27 (×4): qty 1

## 2019-12-27 MED ORDER — ONDANSETRON HCL 4 MG PO TABS: 4.0000 mg | ORAL_TABLET | Freq: Four times a day (QID) | ORAL | Status: DC | PRN

## 2019-12-27 MED ORDER — PROMETHAZINE HCL 25 MG/ML IJ SOLN
12.5000 mg | Freq: Once | INTRAMUSCULAR | Status: DC
  Filled 2019-12-27: qty 1

## 2019-12-27 MED ORDER — FLUTICASONE PROPIONATE 50 MCG/ACT NA SUSP
2.0000 | Freq: Every day | NASAL | Status: DC
  Administered 2019-12-27 – 2019-12-28 (×2): 2 via NASAL
  Filled 2019-12-27: qty 16

## 2019-12-27 MED ORDER — ALLOPURINOL 300 MG PO TABS
300.0000 mg | ORAL_TABLET | Freq: Every day | ORAL | Status: DC
  Administered 2019-12-27 – 2019-12-28 (×2): 300 mg via ORAL
  Filled 2019-12-27 (×2): qty 1

## 2019-12-27 MED ORDER — DIAZEPAM 5 MG PO TABS
5.0000 mg | ORAL_TABLET | Freq: Every day | ORAL | Status: DC
  Administered 2019-12-27: 5 mg via ORAL
  Filled 2019-12-27 (×2): qty 1

## 2019-12-27 MED ORDER — SODIUM CHLORIDE 0.9% FLUSH
3.0000 mL | Freq: Two times a day (BID) | INTRAVENOUS | Status: DC
  Administered 2019-12-27 – 2019-12-28 (×2): 3 mL via INTRAVENOUS

## 2019-12-27 MED ORDER — ACETAMINOPHEN 325 MG PO TABS
650.0000 mg | ORAL_TABLET | Freq: Four times a day (QID) | ORAL | Status: DC | PRN
  Administered 2019-12-27 – 2019-12-28 (×3): 650 mg via ORAL
  Filled 2019-12-27 (×3): qty 2

## 2019-12-27 MED ORDER — SODIUM CHLORIDE 0.9% FLUSH: 3.0000 mL | INTRAVENOUS | Status: DC | PRN

## 2019-12-27 MED ORDER — POTASSIUM CHLORIDE CRYS ER 20 MEQ PO TBCR
40.0000 meq | EXTENDED_RELEASE_TABLET | Freq: Once | ORAL | Status: AC
  Administered 2019-12-27: 40 meq via ORAL
  Filled 2019-12-27: qty 2

## 2019-12-27 MED ORDER — ACYCLOVIR 400 MG PO TABS
400.0000 mg | ORAL_TABLET | Freq: Two times a day (BID) | ORAL | Status: DC
  Administered 2019-12-27 – 2019-12-28 (×3): 400 mg via ORAL
  Filled 2019-12-27 (×4): qty 1

## 2019-12-27 MED ORDER — POLYVINYL ALCOHOL 1.4 % OP SOLN
1.0000 [drp] | Freq: Every day | OPHTHALMIC | Status: DC | PRN
  Filled 2019-12-27: qty 15

## 2019-12-27 MED ORDER — MORPHINE SULFATE (PF) 2 MG/ML IV SOLN: 1.0000 mg | Freq: Once | INTRAVENOUS | Status: DC | PRN

## 2019-12-27 MED ORDER — CLOPIDOGREL BISULFATE 75 MG PO TABS
75.0000 mg | ORAL_TABLET | Freq: Every day | ORAL | Status: DC
  Administered 2019-12-27 – 2019-12-28 (×2): 75 mg via ORAL
  Filled 2019-12-27 (×2): qty 1

## 2019-12-27 MED ORDER — POLYETHYLENE GLYCOL 3350 17 G PO PACK: 17.0000 g | PACK | Freq: Every day | ORAL | Status: DC | PRN

## 2019-12-27 MED ORDER — ONDANSETRON HCL 4 MG/2ML IJ SOLN: 4.0000 mg | Freq: Four times a day (QID) | INTRAMUSCULAR | Status: DC | PRN

## 2019-12-27 MED ORDER — BORTEZOMIB CHEMO SQ INJECTION 3.5 MG (2.5MG/ML): 1.3000 mg/m2 | INTRAMUSCULAR | Status: DC

## 2019-12-27 MED ORDER — PANTOPRAZOLE SODIUM 40 MG PO TBEC
40.0000 mg | DELAYED_RELEASE_TABLET | Freq: Every day | ORAL | Status: DC
  Administered 2019-12-27 – 2019-12-28 (×2): 40 mg via ORAL
  Filled 2019-12-27 (×2): qty 1

## 2019-12-27 MED ORDER — MAGNESIUM OXIDE 400 (241.3 MG) MG PO TABS
600.0000 mg | ORAL_TABLET | Freq: Every day | ORAL | Status: DC
  Administered 2019-12-27 – 2019-12-28 (×2): 600 mg via ORAL
  Filled 2019-12-27 (×2): qty 2

## 2019-12-27 MED ORDER — CARVEDILOL 6.25 MG PO TABS
6.2500 mg | ORAL_TABLET | Freq: Two times a day (BID) | ORAL | Status: DC
  Administered 2019-12-27: 6.25 mg via ORAL
  Filled 2019-12-27: qty 1

## 2019-12-27 MED ORDER — STROKE: EARLY STAGES OF RECOVERY BOOK
Freq: Once | Status: AC
  Administered 2019-12-27: 1
  Filled 2019-12-27: qty 1

## 2019-12-27 MED ORDER — CHLORHEXIDINE GLUCONATE CLOTH 2 % EX PADS
6.0000 | MEDICATED_PAD | Freq: Every day | CUTANEOUS | Status: DC
  Administered 2019-12-27 – 2019-12-28 (×2): 6 via TOPICAL

## 2019-12-27 MED ORDER — DOCOSANOL 10 % EX CREA: 1.0000 "application " | TOPICAL_CREAM | Freq: Every day | CUTANEOUS | Status: DC | PRN

## 2019-12-27 MED ORDER — TRAZODONE HCL 50 MG PO TABS: 50.0000 mg | ORAL_TABLET | Freq: Every evening | ORAL | Status: DC | PRN

## 2019-12-27 MED ORDER — POTASSIUM CHLORIDE CRYS ER 20 MEQ PO TBCR: 20.0000 meq | EXTENDED_RELEASE_TABLET | Freq: Every day | ORAL | Status: DC

## 2019-12-27 MED ORDER — ACETAMINOPHEN 650 MG RE SUPP: 650.0000 mg | Freq: Four times a day (QID) | RECTAL | Status: DC | PRN

## 2019-12-27 MED ORDER — POTASSIUM CHLORIDE CRYS ER 20 MEQ PO TBCR
20.0000 meq | EXTENDED_RELEASE_TABLET | Freq: Every day | ORAL | Status: DC
  Administered 2019-12-28: 20 meq via ORAL
  Filled 2019-12-27: qty 1

## 2019-12-27 MED ORDER — SODIUM CHLORIDE 0.9 % IV SOLN: 250.0000 mL | INTRAVENOUS | Status: DC | PRN

## 2019-12-27 MED ORDER — ALBUTEROL SULFATE (2.5 MG/3ML) 0.083% IN NEBU: 2.5000 mg | INHALATION_SOLUTION | RESPIRATORY_TRACT | Status: DC | PRN

## 2019-12-27 NOTE — Consult Note (Signed)
Referring Physician: Dr. Denton Brick    Chief Complaint: Confusion and expressive aphasia.  HPI: Joanna Reid is an 54 y.o. female with a PMHx of multiple myeloma (s/p stem cell transplant and currently on maintenance Velcade and Revlimid), morbid obesity, DM, HTN, migraines and smoking who presented the [facility] for screening colonoscopy. She developed confusion and expressive aphasia on Saturday, for which a CT head was ordered, revealing no acute abnormality. Teleneurology was consulted. CTP was then obtained with CTA of head and neck, revealing a 24 mL left parietal lobe watershed region of ischemia. Teleneurology felt that there also was a distal left M3 occlusion, but this was not described in the official Radiology report. Transfer to [facility] for stroke work up and possible IR was recommended. The patient was out of the time window for tPA. She was not an endovascular candidate due to location of clot on CTA being too distal for retrieval.   CT head: Normal CT appearance of the brain. ASPECTS is 10/10.  CTA of head and neck with CTP: 1. 24 mL left parietal lobe watershed region area of ischemia without defined core infarct. 2. No emergent large vessel occlusion. 3. Mild narrowing of the mid basilar artery without other significant proximal stenosis, aneurysm, or branch vessel occlusion. 4. Moderate distal small vessel disease in both the anterior and posterior circulation. 5. Moderate tortuosity of the cervical internal carotid arteries bilaterally, likely secondary to hypertension. 6. Atherosclerotic changes at the left carotid bifurcation and cavernous internal carotid arteries bilaterally without significant stenosis. 7. Multilevel degenerative changes in the cervical spine.  LSN: [id number] on Friday tPA Given: No: Out of time window  Past Medical History:  Diagnosis Date   Acid reflux    Allergic rhinitis    Cancer ([place])    multiple myeloma   Diabetes mellitus    type 2   Gout     Gout    HBP (high blood pressure)    History of kidney stones    Migraines     Past Surgical History:  Procedure Laterality Date   BREAST CYST EXCISION Left    2009 no visible scar on skin   CESAREAN SECTION     EXTRACORPOREAL SHOCK WAVE LITHOTRIPSY Left 10/10/2017   Procedure: LEFT EXTRACORPOREAL SHOCK WAVE LITHOTRIPSY (ESWL);  Surgeon: Bjorn Loser, MD;  Location: [facility];  Service: Urology;  Laterality: Left;   EYE SURGERY     HEMORRHOID SURGERY N/A 11/19/2012   Procedure: HEMORRHOIDECTOMY;  Surgeon: Jamesetta So, MD;  Location: [facility];  Service: General;  Laterality: N/A;   kidney stones  1998   LAPAROSCOPIC UNILATERAL SALPINGO OOPHERECTOMY  05/14/2012   Procedure: LAPAROSCOPIC UNILATERAL SALPINGO OOPHORECTOMY;  Surgeon: [name], MD;  Location: [facility];  Service: Gynecology;  Laterality: Right;  laparoscopic right salpingo-oophorectomy   PARTIAL HYSTERECTOMY     TONSILECTOMY, ADENOIDECTOMY, BILATERAL MYRINGOTOMY AND TUBES     VESICOVAGINAL FISTULA CLOSURE W/ TAH      Family History  Problem Relation Age of Onset   Arthritis Other    Cancer Other    Diabetes Other    Hypertension Mother    Dementia Mother    Diabetes Father    ALS Father    Diabetes Brother    Hypertension Brother    Cancer Paternal Aunt    COPD Maternal Grandmother    Cancer Maternal Grandfather    Anesthesia problems Paternal Grandfather    Social History:  reports that she quit smoking about 20 years ago.  She has never used smokeless tobacco. She reports that she does not drink alcohol and does not use drugs.  Allergies: No Known Allergies  Medications:  Prior to Admission:  Medications Prior to Admission  Medication Sig Dispense Refill Last Dose   acyclovir (ZOVIRAX) 400 MG tablet TAKE (1) TABLET BY MOUTH TWICE DAILY. 60 tablet 6 12/26/2019 at Unknown time   allopurinol (ZYLOPRIM) 300 MG tablet TAKE 1 TABLET DAILY (Patient taking differently: Take 300 mg by  mouth daily. ) 90 tablet 1 12/26/2019 at Unknown time   aspirin EC 81 MG tablet Take 1 tablet (81 mg total) by mouth daily. 30 tablet 11 12/25/2019 at 1200   bortezomib SQ (VELCADE) 3.5 MG SOLR Inject 1.2 mLs (3 mg total) into the skin every 14 (fourteen) days for 6 doses. 7.2 mL 12 12/22/2019   carvedilol (COREG) 25 MG tablet Take 25 mg by mouth 2 (two) times daily.   12/26/2019 at 0930   diazepam (VALIUM) 5 MG tablet Take 1 tablet (5 mg total) by mouth at bedtime. 30 tablet 5 12/25/2019 at Unknown time   Docosanol (ABREVA) 10 % CREA Apply 1 application topically daily as needed (for nose).   12/24/2019   docusate sodium (COLACE) 100 MG capsule Take 100 mg by mouth daily.    12/25/2019 at Unknown time   fluticasone (FLONASE) 50 MCG/ACT nasal spray Place 2 sprays into both nostrils daily. 16 g 0 12/25/2019 at Unknown time   lenalidomide (REVLIMID) 10 MG capsule TAKE 1 CAPSULE BY MOUTH  DAILY FOR 21 DAYS ON, THEN  7 DAYS OFF 21 capsule 1 12/25/2019 at Unknown time   losartan (COZAAR) 25 MG tablet Take 25 mg by mouth in the morning and at bedtime.    12/26/2019 at Unknown time   Magnesium 200 MG TABS Take 600 mg by mouth daily.   12/25/2019 at Unknown time   metFORMIN (GLUCOPHAGE) 500 MG tablet TAKE 1 TABLET BY MOUTH ONCE DAILY WITH BREAKFAST (Patient taking differently: Take 1,000 mg by mouth 2 (two) times daily with a meal. ) 90 tablet 0 12/26/2019 at Unknown time   omeprazole (PRILOSEC) 20 MG capsule Take 1 capsule (20 mg total) by mouth daily. 90 capsule 1 12/26/2019 at Unknown time   potassium chloride SA (KLOR-CON) 20 MEQ tablet TAKE (1) TABLET BY MOUTH TWICE DAILY. (Patient taking differently: Take 20 mEq by mouth daily. ) 60 tablet 5 12/25/2019 at Unknown time   pregabalin (LYRICA) 200 MG capsule TAKE (1) CAPSULE BY MOUTH TWICE DAILY. (Patient taking differently: Take 200 mg by mouth daily. ) 60 capsule 4 12/25/2019 at Unknown time   Propylene Glycol (SYSTANE BALANCE) 0.6 % SOLN Apply 1 drop to  eye daily as needed (dry eye).   Past Month at Unknown time   rosuvastatin (CRESTOR) 5 MG tablet Take 5 mg by mouth daily.    12/25/2019 at Unknown time   valACYclovir (VALTREX) 1000 MG tablet TAKE 2 TABLETS NOW, THEN 2 TABLETS 12 HOURS LATER. 12 tablet 0    albuterol (PROVENTIL HFA;VENTOLIN HFA) 108 (90 Base) MCG/ACT inhaler Inhale 2 puffs into the lungs every 4 (four) hours as needed for wheezing or shortness of breath. (Patient not taking: Reported on 12/22/2019) 1 Inhaler 3 Not Taking at Unknown time   bumetanide (BUMEX) 0.5 MG tablet Take 0.5 mg by mouth daily.    12/24/2019   cetirizine (ZYRTEC) 10 MG tablet Take 1 tablet (10 mg total) by mouth daily. (Patient not taking: Reported on 12/26/2019) 30 tablet  0 Not Taking at Unknown time   HYDROcodone-acetaminophen (NORCO/VICODIN) 5-325 MG tablet Take 1 tablet by mouth at bedtime as needed for moderate pain. (Patient not taking: Reported on 12/26/2019) 30 tablet 0 Not Taking at Unknown time   magnesium oxide (MAG-OX) 400 (241.3 Mg) MG tablet Take 1 tablet (400 mg total) by mouth 3 (three) times daily. (Patient not taking: Reported on 12/26/2019) 90 tablet 3 Not Taking at Unknown time   TRULICITY 1.5 UU/7.2ZD SOPN Inject 0.75 mg into the skin every Monday.    12/21/2019   Scheduled:  acyclovir  400 mg Oral BID   allopurinol  300 mg Oral Daily   aspirin  81 mg Oral Daily   atorvastatin  80 mg Oral Daily   carvedilol  6.25 mg Oral BID WC   clopidogrel  75 mg Oral Daily   diazepam  5 mg Oral QHS   fluticasone  2 spray Each Nare Daily   heparin  5,000 Units Subcutaneous Q8H   insulin aspart  0-5 Units Subcutaneous QHS   insulin aspart  0-6 Units Subcutaneous TID WC   magnesium oxide  600 mg Oral Daily   pantoprazole  40 mg Oral Daily   potassium chloride SA  20 mEq Oral Daily   pregabalin  200 mg Oral Daily   promethazine  12.5 mg Intravenous Once   sodium chloride flush  3 mL Intravenous Q12H   Continuous:  sodium  chloride     sodium chloride 40 mL/hr at 12/27/19 0143   cefTRIAXone (ROCEPHIN)  IV 1 g (12/26/19 1811)    ROS: As per HPI. Does not endorse additional symptoms.   Physical Examination: Blood pressure 139/82, pulse 61, temperature 98.5 F (36.9 C), temperature source Oral, resp. rate 12, height 5' 4"  (1.626 m), weight 113.7 kg, SpO2 99 %.  HEENT: [place]/AT Lungs: Respirations unlabored Ext: No edema  Neurologic Examination: Mental Status: Awake and alert. Some stuttering to speech that has an embellished quality. Intermittent unintelligible sounds as speech output when answering some questions - this has a quality most consistent with embellishment. Language becomes completely fluent, with normal prosody when the patient wishes to communicate information that is of apparent concern to her, then lapses back into nonsensical speech. Able to follow all commands without difficulty.  Cranial Nerves: II:  No visual field cut. PERRL.  III,IV, VI: EOMI. No ptosis or nystagmus.  V,VII: Smile symmetric, facial temp sensation equal bilaterally VIII: hearing intact to voice IX,X: Palate elevates normally XI: Symmetric shoulder shrug XII: Midline tongue extension  Motor: Right : Upper extremity   5/5    Left:     Upper extremity   5/5  Lower extremity   5/5     Lower extremity   5/5 No pronator drift.  Normal tone and bulk.  Sensory: Temp and light touch intact throughout, bilaterally Deep Tendon Reflexes:  2+ bilateral brachioradialis, biceps and patellae Plantars: Right: downgoing   Left: downgoing Cerebellar: No ataxia with FNF bilaterally  Gait: Deferred  Results for orders placed or performed during the hospital encounter of 12/26/19 (from the past 48 hour(s))  CBG monitoring, ED     Status: Abnormal   Collection Time: 12/26/19 10:19 AM  Result Value Ref Range   Glucose-Capillary 101 (H) 70 - 99 mg/dL    Comment: Glucose reference range applies only to samples taken after fasting for  at least 8 hours.  Ethanol     Status: None   Collection Time: 12/26/19 10:22 AM  Result Value Ref Range   Alcohol, Ethyl (B) <10 <10 mg/dL    Comment: (NOTE) Lowest detectable limit for serum alcohol is 10 mg/dL.  For medical purposes only. Performed at [facility], [address] [place], [place] [address]   Protime-INR     Status: None   Collection Time: 12/26/19 10:22 AM  Result Value Ref Range   Prothrombin Time 12.9 11.4 - 15.2 seconds   INR 1.0 0.8 - 1.2    Comment: (NOTE) INR goal varies based on device and disease states. Performed at [facility], [address] [place], [place] [address]   APTT     Status: None   Collection Time: 12/26/19 10:22 AM  Result Value Ref Range   aPTT 27 24 - 36 seconds    Comment: Performed at [facility], [address] [place], [place] [address]  CBC     Status: Abnormal   Collection Time: 12/26/19 10:22 AM  Result Value Ref Range   WBC 2.4 (L) 4.0 - 10.5 K/uL   RBC 3.61 (L) 3.87 - 5.11 MIL/uL   Hemoglobin 11.1 (L) 12.0 - 15.0 g/dL   HCT 34.5 (L) 36 - 46 %   MCV 95.6 80.0 - 100.0 fL   MCH 30.7 26.0 - 34.0 pg   MCHC 32.2 30.0 - 36.0 g/dL   RDW 15.1 11.5 - 15.5 %   Platelets 104 (L) 150 - 400 K/uL    Comment: SPECIMEN CHECKED FOR CLOTS PLATELET COUNT CONFIRMED BY SMEAR    nRBC 0.0 0.0 - 0.2 %    Comment: Performed at [facility], [address] [place], [place] [address]  Differential     Status: Abnormal   Collection Time: 12/26/19 10:22 AM  Result Value Ref Range   Neutrophils Relative % 40 %   Neutro Abs 1.0 (L) 1.7 - 7.7 K/uL   Lymphocytes Relative 38 %   Lymphs Abs 0.9 0.7 - 4.0 K/uL   Monocytes Relative 17 %   Monocytes Absolute 0.4 0 - 1 K/uL   Eosinophils Relative 4 %   Eosinophils Absolute 0.1 0 - 0 K/uL   Basophils Relative 1 %   Basophils Absolute 0.0 0 - 0 K/uL   Immature Granulocytes 0 %   Abs Immature Granulocytes 0.01 0.00 - 0.07 K/uL    Comment: Performed at [facility], [address],  [place], [place] [address]  Comprehensive metabolic panel     Status: Abnormal   Collection Time: 12/26/19 10:22 AM  Result Value Ref Range   Sodium 139 135 - 145 mmol/L   Potassium 3.8 3.5 - 5.1 mmol/L   Chloride 106 98 - 111 mmol/L   CO2 25 22 - 32 mmol/L   Glucose, Bld 103 (H) 70 - 99 mg/dL    Comment: Glucose reference range applies only to samples taken after fasting for at least 8 hours.   BUN 16 6 - 20 mg/dL   Creatinine, Ser 1.04 (H) 0.44 - 1.00 mg/dL   Calcium 8.8 (L) 8.9 - 10.3 mg/dL   Total Protein 7.0 6.5 - 8.1 g/dL   Albumin 4.0 3.5 - 5.0 g/dL   AST 29 15 - 41 U/L   ALT 26 0 - 44 U/L   Alkaline Phosphatase 65 38 - 126 U/L   Total Bilirubin 0.6 0.3 - 1.2 mg/dL   GFR calc non Af Amer >60 >60 mL/min   GFR calc Af Amer >60 >60 mL/min   Anion gap 8 5 - 15  Comment: Performed at [facility], [address] [place], [place] [address]  Urine rapid drug screen (hosp performed)     Status: Abnormal   Collection Time: 12/26/19 10:22 AM  Result Value Ref Range   Opiates NONE DETECTED NONE DETECTED   Cocaine NONE DETECTED NONE DETECTED   Benzodiazepines POSITIVE (A) NONE DETECTED   Amphetamines NONE DETECTED NONE DETECTED   Tetrahydrocannabinol NONE DETECTED NONE DETECTED   Barbiturates NONE DETECTED NONE DETECTED    Comment: (NOTE) DRUG SCREEN FOR MEDICAL PURPOSES ONLY.  IF CONFIRMATION IS NEEDED FOR ANY PURPOSE, NOTIFY LAB WITHIN 5 DAYS.  LOWEST DETECTABLE LIMITS FOR URINE DRUG SCREEN Drug Class                     Cutoff (ng/mL) Amphetamine and metabolites    1000 Barbiturate and metabolites    200 Benzodiazepine                 790 Tricyclics and metabolites     300 Opiates and metabolites        300 Cocaine and metabolites        300 THC                            50 Performed at [facility], [address] [place], [place] [address]   Urinalysis, Routine w reflex microscopic     Status: Abnormal   Collection Time: 12/26/19 10:22 AM  Result Value Ref Range    Color, Urine YELLOW YELLOW   APPearance CLEAR CLEAR   Specific Gravity, Urine >1.046 (H) 1.005 - 1.030   pH 5.0 5.0 - 8.0   Glucose, UA NEGATIVE NEGATIVE mg/dL   Hgb urine dipstick NEGATIVE NEGATIVE   Bilirubin Urine NEGATIVE NEGATIVE   Ketones, ur NEGATIVE NEGATIVE mg/dL   Protein, ur NEGATIVE NEGATIVE mg/dL   Nitrite POSITIVE (A) NEGATIVE   Leukocytes,Ua NEGATIVE NEGATIVE   RBC / HPF 0-5 0 - 5 RBC/hpf   WBC, UA 21-50 0 - 5 WBC/hpf   Bacteria, UA RARE (A) NONE SEEN   Squamous Epithelial / LPF 6-10 0 - 5    Comment: Performed at [facility], [address], [place], [place] [address]  I-stat chem 8, ED     Status: Abnormal   Collection Time: 12/26/19 10:31 AM  Result Value Ref Range   Sodium 143 135 - 145 mmol/L   Potassium 3.9 3.5 - 5.1 mmol/L   Chloride 105 98 - 111 mmol/L   BUN 14 6 - 20 mg/dL   Creatinine, Ser 1.00 0.44 - 1.00 mg/dL   Glucose, Bld 103 (H) 70 - 99 mg/dL    Comment: Glucose reference range applies only to samples taken after fasting for at least 8 hours.   Calcium, Ion 1.26 1.15 - 1.40 mmol/L   TCO2 25 22 - 32 mmol/L   Hemoglobin 11.6 (L) 12.0 - 15.0 g/dL   HCT 34.0 (L) 36 - 46 %  SARS Coronavirus 2 by RT PCR (hospital order, performed in [facility] hospital lab) Nasopharyngeal Nasopharyngeal Swab     Status: None   Collection Time: 12/26/19 11:45 AM   Specimen: Nasopharyngeal Swab  Result Value Ref Range   SARS Coronavirus 2 NEGATIVE NEGATIVE    Comment: (NOTE) SARS-CoV-2 target nucleic acids are NOT DETECTED.  The SARS-CoV-2 RNA is generally detectable in upper and lower respiratory specimens during the acute phase of infection. The lowest concentration of SARS-CoV-2  viral copies this assay can detect is 250 copies / mL. A negative result does not preclude SARS-CoV-2 infection and should not be used as the sole basis for treatment or other patient management decisions.  A negative result may occur with improper specimen collection / handling,  submission of specimen other than nasopharyngeal swab, presence of viral mutation(s) within the areas targeted by this assay, and inadequate number of viral copies (<250 copies / mL). A negative result must be combined with clinical observations, patient history, and epidemiological information.  Fact Sheet for Patients:   StrictlyIdeas.no  Fact Sheet for Healthcare Providers: BankingDealers.co.za  This test is not yet approved or  cleared by the [place] FDA and has been authorized for detection and/or diagnosis of SARS-CoV-2 by FDA under an Emergency Use Authorization (EUA).  This EUA will remain in effect (meaning this test can be used) for the duration of the COVID-19 declaration under Section 564(b)(1) of the Act, 21 U.S.C. section 360bbb-3(b)(1), unless the authorization is terminated or revoked sooner.  Performed at [facility], [address] [place], [place] [address]   POC urine preg, ED     Status: None   Collection Time: 12/26/19  3:48 PM  Result Value Ref Range   Preg Test, Ur NEGATIVE NEGATIVE    Comment:        THE SENSITIVITY OF THIS METHODOLOGY IS >24 mIU/mL   CBG monitoring, ED     Status: None   Collection Time: 12/26/19  6:05 PM  Result Value Ref Range   Glucose-Capillary 78 70 - 99 mg/dL    Comment: Glucose reference range applies only to samples taken after fasting for at least 8 hours.  Glucose, capillary     Status: None   Collection Time: 12/27/19 12:08 AM  Result Value Ref Range   Glucose-Capillary 85 70 - 99 mg/dL    Comment: Glucose reference range applies only to samples taken after fasting for at least 8 hours.   CT Code Stroke CTA Head W/WO contrast  Result Date: 12/26/2019 CLINICAL DATA:  Expressive aphasia. Last known normal at 11 p.m. last evening. EXAM: CT ANGIOGRAPHY HEAD AND NECK CT PERFUSION BRAIN TECHNIQUE: Multidetector CT imaging of the head and neck was performed using the standard  protocol during bolus administration of intravenous contrast. Multiplanar CT image reconstructions and MIPs were obtained to evaluate the vascular anatomy. Carotid stenosis measurements (when applicable) are obtained utilizing NASCET criteria, using the distal internal carotid diameter as the denominator. Multiphase CT imaging of the brain was performed following IV bolus contrast injection. Subsequent parametric perfusion maps were calculated using RAPID software. CONTRAST:  100 mL Omnipaque 350 COMPARISON:  CT head without contrast 12/26/2019 FINDINGS: CTA NECK FINDINGS Aortic arch: A 3 vessel arch configuration is present. Mild atherosclerotic changes are present without significant stenosis or aneurysm. Right carotid system: The right common carotid artery is within normal limits. Bifurcation is unremarkable. Moderate tortuosity is present in the cervical right ICA without significant stenosis. Left carotid system: The left common carotid artery is within normal limits. Atherosclerotic changes are noted at the bifurcation. Moderate tortuosity is present in the cervical left ICA without significant stenosis. Vertebral arteries: The vertebral arteries are codominant. Tortuosity is noted proximally without significant stenosis. Both vertebral arteries originate from the subclavian arteries. Atherosclerotic changes are noted on the right without significant stenosis. No significant stenosis is present in either vertebral artery in the neck. Skeleton: Multilevel degenerative changes are present cervical spine. Slight degenerative anterolisthesis is present  at C2-3 and C3-4. Multilevel endplate changes are present at C4-5 most notably. No focal lytic or blastic lesions are present. Other neck: The soft tissues the neck otherwise within normal limits. No neck lesions are present. Salivary glands are within normal limits. Thyroid is normal. No significant adenopathy is present. Upper chest: The lung apices are clear.  Thoracic inlet is within normal limits. Review of the MIP images confirms the above findings CTA HEAD FINDINGS Anterior circulation: Atherosclerotic calcifications are present within the cavernous internal carotid arteries bilaterally without significant stenosis. Terminal ICA is normal bilaterally. The A1 and M1 segments are normal. The anterior communicating artery is patent. MCA bifurcations are intact. Moderate attenuation of distal branch vessels is present without a significant proximal stenosis or occlusion. No aneurysm is present. Posterior circulation: The vertebral arteries are codominant. PICA origins are visualized and normal. The left V4 segment is hypoplastic. Vertebrobasilar junction is normal. Mild narrowing is present the mid basilar artery, to 50%. Both posterior cerebral arteries originate from basilar tip. The PCA branch vessels demonstrate distal attenuation without a significant proximal stenosis or occlusion. Venous sinuses: The dural sinuses are patent. The straight sinus deep cerebral veins are intact. Cortical veins are unremarkable. Anatomic variants: None Review of the MIP images confirms the above findings CT Brain Perfusion Findings: ASPECTS: 10/10 CBF (<30%) Volume: 32m Perfusion (Tmax>6.0s) volume: 055mMismatch Volume: 2493mnfarction Location:Left parietal lobe, watershed region. IMPRESSION: 1. 24 mL left parietal lobe watershed region area of ischemia without defined core infarct. 2. No emergent large vessel occlusion. 3. Mild narrowing of the mid basilar artery without other significant proximal stenosis, aneurysm, or branch vessel occlusion. 4. Moderate distal small vessel disease in both the anterior and posterior circulation. 5. Moderate tortuosity of the cervical internal carotid arteries bilaterally, likely secondary to hypertension. 6. Atherosclerotic changes at the left carotid bifurcation and cavernous internal carotid arteries bilaterally without significant stenosis. 7.  Multilevel degenerative changes in the cervical spine. These results were called by telephone at the time of interpretation on 12/26/2019 at 11:31 am to provider Dr. TriLangston Maskerho verbally acknowledged these results. Electronically Signed   By: ChrSan MorelleD.   On: 12/26/2019 11:31   CT Code Stroke CTA Neck W/WO contrast  Result Date: 12/26/2019 CLINICAL DATA:  Expressive aphasia. Last known normal at 11 p.m. last evening. EXAM: CT ANGIOGRAPHY HEAD AND NECK CT PERFUSION BRAIN TECHNIQUE: Multidetector CT imaging of the head and neck was performed using the standard protocol during bolus administration of intravenous contrast. Multiplanar CT image reconstructions and MIPs were obtained to evaluate the vascular anatomy. Carotid stenosis measurements (when applicable) are obtained utilizing NASCET criteria, using the distal internal carotid diameter as the denominator. Multiphase CT imaging of the brain was performed following IV bolus contrast injection. Subsequent parametric perfusion maps were calculated using RAPID software. CONTRAST:  100 mL Omnipaque 350 COMPARISON:  CT head without contrast 12/26/2019 FINDINGS: CTA NECK FINDINGS Aortic arch: A 3 vessel arch configuration is present. Mild atherosclerotic changes are present without significant stenosis or aneurysm. Right carotid system: The right common carotid artery is within normal limits. Bifurcation is unremarkable. Moderate tortuosity is present in the cervical right ICA without significant stenosis. Left carotid system: The left common carotid artery is within normal limits. Atherosclerotic changes are noted at the bifurcation. Moderate tortuosity is present in the cervical left ICA without significant stenosis. Vertebral arteries: The vertebral arteries are codominant. Tortuosity is noted proximally without significant stenosis. Both vertebral arteries originate from the subclavian  arteries. Atherosclerotic changes are noted on the right  without significant stenosis. No significant stenosis is present in either vertebral artery in the neck. Skeleton: Multilevel degenerative changes are present cervical spine. Slight degenerative anterolisthesis is present at C2-3 and C3-4. Multilevel endplate changes are present at C4-5 most notably. No focal lytic or blastic lesions are present. Other neck: The soft tissues the neck otherwise within normal limits. No neck lesions are present. Salivary glands are within normal limits. Thyroid is normal. No significant adenopathy is present. Upper chest: The lung apices are clear. Thoracic inlet is within normal limits. Review of the MIP images confirms the above findings CTA HEAD FINDINGS Anterior circulation: Atherosclerotic calcifications are present within the cavernous internal carotid arteries bilaterally without significant stenosis. Terminal ICA is normal bilaterally. The A1 and M1 segments are normal. The anterior communicating artery is patent. MCA bifurcations are intact. Moderate attenuation of distal branch vessels is present without a significant proximal stenosis or occlusion. No aneurysm is present. Posterior circulation: The vertebral arteries are codominant. PICA origins are visualized and normal. The left V4 segment is hypoplastic. Vertebrobasilar junction is normal. Mild narrowing is present the mid basilar artery, to 50%. Both posterior cerebral arteries originate from basilar tip. The PCA branch vessels demonstrate distal attenuation without a significant proximal stenosis or occlusion. Venous sinuses: The dural sinuses are patent. The straight sinus deep cerebral veins are intact. Cortical veins are unremarkable. Anatomic variants: None Review of the MIP images confirms the above findings CT Brain Perfusion Findings: ASPECTS: 10/10 CBF (<30%) Volume: 80m Perfusion (Tmax>6.0s) volume: 075mMismatch Volume: 2433mnfarction Location:Left parietal lobe, watershed region. IMPRESSION: 1. 24 mL left  parietal lobe watershed region area of ischemia without defined core infarct. 2. No emergent large vessel occlusion. 3. Mild narrowing of the mid basilar artery without other significant proximal stenosis, aneurysm, or branch vessel occlusion. 4. Moderate distal small vessel disease in both the anterior and posterior circulation. 5. Moderate tortuosity of the cervical internal carotid arteries bilaterally, likely secondary to hypertension. 6. Atherosclerotic changes at the left carotid bifurcation and cavernous internal carotid arteries bilaterally without significant stenosis. 7. Multilevel degenerative changes in the cervical spine. These results were called by telephone at the time of interpretation on 12/26/2019 at 11:31 am to provider Dr. TriLangston Maskerho verbally acknowledged these results. Electronically Signed   By: ChrSan MorelleD.   On: 12/26/2019 11:31   CT Code Stroke Cerebral Perfusion with contrast  Result Date: 12/26/2019 CLINICAL DATA:  Expressive aphasia. Last known normal at 11 p.m. last evening. EXAM: CT ANGIOGRAPHY HEAD AND NECK CT PERFUSION BRAIN TECHNIQUE: Multidetector CT imaging of the head and neck was performed using the standard protocol during bolus administration of intravenous contrast. Multiplanar CT image reconstructions and MIPs were obtained to evaluate the vascular anatomy. Carotid stenosis measurements (when applicable) are obtained utilizing NASCET criteria, using the distal internal carotid diameter as the denominator. Multiphase CT imaging of the brain was performed following IV bolus contrast injection. Subsequent parametric perfusion maps were calculated using RAPID software. CONTRAST:  100 mL Omnipaque 350 COMPARISON:  CT head without contrast 12/26/2019 FINDINGS: CTA NECK FINDINGS Aortic arch: A 3 vessel arch configuration is present. Mild atherosclerotic changes are present without significant stenosis or aneurysm. Right carotid system: The right common carotid  artery is within normal limits. Bifurcation is unremarkable. Moderate tortuosity is present in the cervical right ICA without significant stenosis. Left carotid system: The left common carotid artery is within normal limits. Atherosclerotic changes are  noted at the bifurcation. Moderate tortuosity is present in the cervical left ICA without significant stenosis. Vertebral arteries: The vertebral arteries are codominant. Tortuosity is noted proximally without significant stenosis. Both vertebral arteries originate from the subclavian arteries. Atherosclerotic changes are noted on the right without significant stenosis. No significant stenosis is present in either vertebral artery in the neck. Skeleton: Multilevel degenerative changes are present cervical spine. Slight degenerative anterolisthesis is present at C2-3 and C3-4. Multilevel endplate changes are present at C4-5 most notably. No focal lytic or blastic lesions are present. Other neck: The soft tissues the neck otherwise within normal limits. No neck lesions are present. Salivary glands are within normal limits. Thyroid is normal. No significant adenopathy is present. Upper chest: The lung apices are clear. Thoracic inlet is within normal limits. Review of the MIP images confirms the above findings CTA HEAD FINDINGS Anterior circulation: Atherosclerotic calcifications are present within the cavernous internal carotid arteries bilaterally without significant stenosis. Terminal ICA is normal bilaterally. The A1 and M1 segments are normal. The anterior communicating artery is patent. MCA bifurcations are intact. Moderate attenuation of distal branch vessels is present without a significant proximal stenosis or occlusion. No aneurysm is present. Posterior circulation: The vertebral arteries are codominant. PICA origins are visualized and normal. The left V4 segment is hypoplastic. Vertebrobasilar junction is normal. Mild narrowing is present the mid basilar  artery, to 50%. Both posterior cerebral arteries originate from basilar tip. The PCA branch vessels demonstrate distal attenuation without a significant proximal stenosis or occlusion. Venous sinuses: The dural sinuses are patent. The straight sinus deep cerebral veins are intact. Cortical veins are unremarkable. Anatomic variants: None Review of the MIP images confirms the above findings CT Brain Perfusion Findings: ASPECTS: 10/10 CBF (<30%) Volume: 86m Perfusion (Tmax>6.0s) volume: 030mMismatch Volume: 2464mnfarction Location:Left parietal lobe, watershed region. IMPRESSION: 1. 24 mL left parietal lobe watershed region area of ischemia without defined core infarct. 2. No emergent large vessel occlusion. 3. Mild narrowing of the mid basilar artery without other significant proximal stenosis, aneurysm, or branch vessel occlusion. 4. Moderate distal small vessel disease in both the anterior and posterior circulation. 5. Moderate tortuosity of the cervical internal carotid arteries bilaterally, likely secondary to hypertension. 6. Atherosclerotic changes at the left carotid bifurcation and cavernous internal carotid arteries bilaterally without significant stenosis. 7. Multilevel degenerative changes in the cervical spine. These results were called by telephone at the time of interpretation on 12/26/2019 at 11:31 am to provider Dr. TriLangston Maskerho verbally acknowledged these results. Electronically Signed   By: ChrSan MorelleD.   On: 12/26/2019 11:31   ECHOCARDIOGRAM COMPLETE  Result Date: 12/26/2019    ECHOCARDIOGRAM REPORT   Patient Name:   ROBWIKTORIA HEMRICKte of Exam: 12/26/2019 Medical Rec #:  [id number]     Height:       64.0 in Accession #:    [id number]    Weight:       253.3 lb Date of Birth:  2/1Jan 05, 1967     BSA:          2.163 m Patient Age:    [age]         BP:           149/74 mmHg Patient Gender: F                HR:           52 bpm. Exam Location:  [facility] Procedure: 2D Echo  Indications:    stroke 434.91  History:        Patient has prior history of Echocardiogram examinations, most                 recent 12/08/2014. Risk Factors:Diabetes and Former Smoker. High                 blood pressure. cancer.  Sonographer:    Jannett Celestine RDCS (AE) Referring Phys: [id number] [facility]  Sonographer Comments: Image acquisition challenging due to patient body habitus. limited mobility IMPRESSIONS  1. Left ventricular ejection fraction, by estimation, is 60 to 65%. The left ventricle has normal function. The left ventricle has no regional wall motion abnormalities. There is mild left ventricular hypertrophy of the basal-septal segment. Left ventricular diastolic function could not be evaluated.  2. Right ventricular systolic function is normal. The right ventricular size is normal. Tricuspid regurgitation signal is inadequate for assessing PA pressure.  3. The mitral valve is normal in structure. Trivial mitral valve regurgitation. No evidence of mitral stenosis.  4. The aortic valve is normal in structure. Aortic valve regurgitation is trivial. No aortic stenosis is present. FINDINGS  Left Ventricle: Left ventricular ejection fraction, by estimation, is 60 to 65%. The left ventricle has normal function. The left ventricle has no regional wall motion abnormalities. The left ventricular internal cavity size was normal in size. There is  mild left ventricular hypertrophy of the basal-septal segment. Left ventricular diastolic function could not be evaluated. Right Ventricle: The right ventricular size is normal. No increase in right ventricular wall thickness. Right ventricular systolic function is normal. Tricuspid regurgitation signal is inadequate for assessing PA pressure. Left Atrium: Left atrial size was normal in size. Right Atrium: Right atrial size was normal in size. Pericardium: There is no evidence of pericardial effusion. Mitral Valve: The mitral valve is normal in structure. Normal  mobility of the mitral valve leaflets. Trivial mitral valve regurgitation. No evidence of mitral valve stenosis. Tricuspid Valve: The tricuspid valve is normal in structure. Tricuspid valve regurgitation is trivial. No evidence of tricuspid stenosis. Aortic Valve: The aortic valve is normal in structure. Aortic valve regurgitation is trivial. No aortic stenosis is present. Pulmonic Valve: The pulmonic valve was normal in structure. Pulmonic valve regurgitation is not visualized. No evidence of pulmonic stenosis. Aorta: The aortic root is normal in size and structure. Venous: The inferior vena cava was not well visualized. IAS/Shunts: No atrial level shunt detected by color flow Doppler.  LEFT VENTRICLE PLAX 2D LVIDd:         4.91 cm LVIDs:         2.93 cm LV PW:         0.99 cm LV IVS:        1.18 cm LVOT diam:     2.10 cm LV SV:         58 LV SV Index:   27 LVOT Area:     3.46 cm  RIGHT VENTRICLE TAPSE (M-mode): 2.9 cm LEFT ATRIUM             Index LA diam:        3.80 cm 1.76 cm/m LA Vol (A2C):   85.0 ml 39.30 ml/m LA Vol (A4C):   56.1 ml 25.94 ml/m LA Biplane Vol: 72.8 ml 33.66 ml/m  AORTIC VALVE LVOT Vmax:   70.50 cm/s LVOT Vmean:  55.200 cm/s LVOT VTI:    0.167 m  AORTA Ao  Root diam: 3.40 cm MITRAL VALVE MV Area (PHT): 1.86 cm    SHUNTS MV Decel Time: 407 msec    Systemic VTI:  0.17 m MV E velocity: 61.30 cm/s  Systemic Diam: 2.10 cm MV A velocity: 45.00 cm/s MV E/A ratio:  1.36 Fransico Him MD Electronically signed by Fransico Him MD Signature Date/Time: 12/26/2019/4:47:12 PM    Final    CT HEAD CODE STROKE WO CONTRAST  Result Date: 12/26/2019 CLINICAL DATA:  Code stroke. Sudden onset of abnormal speech beginning this morning. Last known normal was 11 p.m. last night. EXAM: CT HEAD WITHOUT CONTRAST TECHNIQUE: Contiguous axial images were obtained from the base of the skull through the vertex without intravenous contrast. COMPARISON:  CT maxillofacial 04/22/2016 FINDINGS: Brain: No acute infarct,  hemorrhage, or mass lesion is present. Basal ganglia are normal. Insular ribbon is normal bilaterally. No significant white matter lesions are present. No focal cortical lesions are present. The brainstem and cerebellum are within normal limits. Vascular: No hyperdense vessel or unexpected calcification. Skull: Calvarium is intact. No focal lytic or blastic lesions are present. No significant extracranial soft tissue lesion is present. Sinuses/Orbits: The right maxillary sinus is shrunken suggesting a history of chronic disease. No active disease is present. The paranasal sinuses and mastoid air cells are otherwise clear. The globes and orbits are within normal limits. ASPECTS [facility] Stroke Program Early CT Score) - Ganglionic level infarction (caudate, lentiform nuclei, internal capsule, insula, M1-M3 cortex): 7/7 - Supraganglionic infarction (M4-M6 cortex): 3/3 Total score (0-10 with 10 being normal): 10/10 IMPRESSION: 1. Normal CT appearance of the brain. 2. ASPECTS is 10/10. These results were called by telephone at the time of interpretation on 12/26/2019 at 10:35 am to provider [facility] , who verbally acknowledged these results. Electronically Signed   By: San Morelle M.D.   On: 12/26/2019 10:35    Assessment: 54 y.o. female presenting with confusion and speech deficit 1. Exam reveals speech deficit that has a quality most consistent with non-organic etiology. However, CTP showed an objective perfusion deficit and CTA showed findings most consistent with atherosclerotic narrowing in the head and neck.  2. Echocardiogram reveals normal EF, with no mural thrombus per the report.  3. Stroke Risk Factors - multiple myeloma, morbid obesity, DM, HTN and smoking  Recommendations: 1. HgbA1c, fasting lipid panel 2. MRI of the brain without contrast 3. PT consult, OT consult, Speech consult 4. Frequent neuro checks 5. Telemetry monitoring 6. Prophylactic therapy- Agree with continuing ASA.  Agree with starting atorvastatin and Plavix.  7. Risk factor modification 8. Obtain baseline CK level.  9. Permissive HTN x 24 hours   @Electronically  signed: Dr. Kerney Elbe 12/27/2019, 2:44 AM

## 2019-12-27 NOTE — Progress Notes (Signed)
CRITICAL VALUE ALERT  Critical Value:  MRSA PCR positive  Date & Time Notied:  12/27/2019; 09:15  Provider Notified: Dr. Candiss Norse  Orders Received/Actions taken: see MAR

## 2019-12-27 NOTE — Progress Notes (Signed)
STROKE TEAM PROGRESS NOTE   HISTORY OF PRESENT ILLNESS (per record) Joanna Reid is an 54 y.o. female with a PMHx of multiple myeloma (s/p stem cell transplant and currently on maintenance Velcade and Revlimid), morbid obesity, DM, HTN, migraines and smoking who presented the [facility] for screening colonoscopy. She developed confusion and expressive aphasia on Saturday, for which a CT head was ordered, revealing no acute abnormality. Teleneurology was consulted. CTP was then obtained with CTA of head and neck, revealing a 24 mL left parietal lobe watershed region of ischemia. Teleneurology felt that there also was a distal left M3 occlusion, but this was not described in the official Radiology report. Transfer to [facility] for stroke work up and possible IR was recommended. The patient was out of the time window for tPA. She was not an endovascular candidate due to location of clot on CTA being too distal for retrieval.  CT head: Normal CT appearance of the brain. ASPECTS is 10/10. CTA of head and neck with CTP: 1. 24 mL left parietal lobe watershed region area of ischemia without defined core infarct. 2. No emergent large vessel occlusion. 3. Mild narrowing of the mid basilar artery without other significant proximal stenosis, aneurysm, or branch vessel occlusion. 4. Moderate distal small vessel disease in both the anterior and posterior circulation. 5. Moderate tortuosity of the cervical internal carotid arteries bilaterally, likely secondary to hypertension. 6. Atherosclerotic changes at the left carotid bifurcation and cavernous internal carotid arteries bilaterally without significant stenosis. 7. Multilevel degenerative changes in the cervical spine. LSN: 2230 on Friday tPA Given: No: Out of time window   INTERVAL HISTORY The patient's husband is at the bedside.  They think that she still have some difficulties with speech.    OBJECTIVE Vitals:   12/26/19 2100 12/26/19 2226 12/27/19 0009  12/27/19 0337  BP: (!) 149/70 (!) 152/88 139/82 128/73  Pulse: (!) 53 65 61 62  Resp: 13 18 12 12   Temp:  98.4 F (36.9 C) 98.5 F (36.9 C) 98.6 F (37 C)  TempSrc:  Oral Oral Oral  SpO2: 100% 99% 99% 98%  Weight:  113.7 kg    Height:        CBC:  Recent Labs  Lab 12/22/19 1040 12/22/19 1040 12/26/19 1022 12/26/19 1022 12/26/19 1031 12/27/19 0228  WBC 2.9*   < > 2.4*  --   --  1.9*  NEUTROABS 1.4*  --  1.0*  --   --   --   HGB 11.4*   < > 11.1*   < > 11.6* 9.8*  HCT 35.6*   < > 34.5*   < > 34.0* 30.6*  MCV 97.0   < > 95.6  --   --  94.4  PLT 153   < > 104*  --   --  PENDING   < > = values in this interval not displayed.    Basic Metabolic Panel:  Recent Labs  Lab 12/22/19 1040 12/22/19 1040 12/26/19 1022 12/26/19 1022 12/26/19 1031 12/27/19 0228  NA 142   < > 139   < > 143 140  K 4.0   < > 3.8   < > 3.9 3.4*  CL 104   < > 106   < > 105 107  CO2 27   < > 25  --   --  23  GLUCOSE 108*   < > 103*   < > 103* 103*  BUN 30*   < > 16   < >  14 11  CREATININE 1.22*   < > 1.04*   < > 1.00 0.99  CALCIUM 9.7   < > 8.8*  --   --  8.7*  MG 1.9  --   --   --   --   --    < > = values in this interval not displayed.    Lipid Panel:     Component Value Date/Time   CHOL 193 01/22/2013 0925   TRIG 220 (H) 01/22/2013 0925   HDL 42 01/22/2013 0925   CHOLHDL 4.6 01/22/2013 0925   VLDL 44 (H) 01/22/2013 0925   LDLCALC 107 (H) 01/22/2013 0925   HgbA1c:  Lab Results  Component Value Date   HGBA1C 5.7 (H) 12/25/2018   Urine Drug Screen:     Component Value Date/Time   LABOPIA NONE DETECTED 12/26/2019 1022   COCAINSCRNUR NONE DETECTED 12/26/2019 1022   LABBENZ POSITIVE (A) 12/26/2019 1022   AMPHETMU NONE DETECTED 12/26/2019 1022   THCU NONE DETECTED 12/26/2019 1022   LABBARB NONE DETECTED 12/26/2019 1022    Alcohol Level     Component Value Date/Time   ETH <10 12/26/2019 1022    IMAGING  CT Code Stroke CTA Head W/WO contrast CT Code Stroke CTA Neck W/WO  contrast CT Code Stroke Cerebral Perfusion with contrast 12/26/2019 IMPRESSION:  1. 24 mL left parietal lobe watershed region area of ischemia without defined core infarct.  2. No emergent large vessel occlusion.  3. Mild narrowing of the mid basilar artery without other significant proximal stenosis, aneurysm, or branch vessel occlusion.  4. Moderate distal small vessel disease in both the anterior and posterior circulation.  5. Moderate tortuosity of the cervical internal carotid arteries bilaterally, likely secondary to hypertension.  6. Atherosclerotic changes at the left carotid bifurcation and cavernous internal carotid arteries bilaterally without significant stenosis.  7. Multilevel degenerative changes in the cervical spine.   CT HEAD CODE STROKE WO CONTRAST 12/26/2019 IMPRESSION:  1. Normal CT appearance of the brain.  2. ASPECTS is 10/10.   ECHOCARDIOGRAM COMPLETE 12/26/2019 IMPRESSIONS   1. Left ventricular ejection fraction, by estimation, is 60 to 65%. The left ventricle has normal function. The left ventricle has no regional wall motion abnormalities. There is mild left ventricular hypertrophy of the basal-septal segment. Left ventricular diastolic function could not be evaluated.   2. Right ventricular systolic function is normal. The right ventricular size is normal. Tricuspid regurgitation signal is inadequate for assessing PA pressure.   3. The mitral valve is normal in structure. Trivial mitral valve regurgitation. No evidence of mitral stenosis.   4. The aortic valve is normal in structure. Aortic valve regurgitation is trivial. No aortic stenosis is present.   ECG - SB rate 53 BPM. (See cardiology reading for complete details)   PHYSICAL EXAM Blood pressure 128/73, pulse 62, temperature 98.6 F (37 C), temperature source Oral, resp. rate 12, height 5' 4"  (1.626 m), weight 113.7 kg, SpO2 98 %. GENERAL: This is a pleasant obese female who is in some discomfort but no  acute distress.  HEENT: Neck is supple no trauma noted.  ABDOMEN: soft  EXTREMITIES: No edema   BACK: Normal  SKIN: Normal by inspection.    MENTAL STATUS: Alert and oriented.  Speech is normal.  She has subtle word finding difficulties.  CRANIAL NERVES: Pupils are equal, round and reactive to light and accomodation; extra ocular movements are full, there is no significant nystagmus; visual fields are full; upper and lower facial  muscles are normal in strength and symmetric, there is no flattening of the nasolabial folds; tongue is midline; uvula is midline; shoulder elevation is normal.  MOTOR: Normal tone, bulk and strength; no pronator drift.  COORDINATION: Left finger to nose is normal, right finger to nose is normal, No rest tremor; no intention tremor; no postural tremor; no bradykinesia.  SENSATION: Normal to light touch, temperature, and pain.           ASSESSMENT/PLAN Ms. KALISA GIRTMAN is a 54 y.o. female with history of multiple myeloma (s/p stem cell transplant and currently on maintenance Velcade and Revlimid), Covid positive in March, morbid obesity, DM, HTN, migraines and smoking who presented the [facility] for screening colonoscopy. She developed confusion and expressive aphasia on Saturday.  She did not receive IV t-PA due to being outside of the therapeutic time window.  Stroke vs TIA: MRI pending  Resultant mild word finding difficulties/mild aphasia.  Code Stroke CT Head - Normal CT appearance of the brain. ASPECTS is 10/10.     CT head - not ordered  MRI head - pending  MRA head - not ordered  CTA H&N -  No emergent large vessel occlusion. Mild narrowing of the mid basilar artery without other significant proximal stenosis, aneurysm, or branch vessel occlusion. Moderate distal small vessel disease in both the anterior and posterior circulation. Moderate tortuosity of the cervical internal carotid arteries bilaterally, likely secondary to hypertension.  Atherosclerotic changes at the left carotid bifurcation and cavernous internal carotid arteries bilaterally without significant stenosis.  CT Perfusion - 24 mL left parietal lobe watershed region area of ischemia without defined core infarct.   Carotid Doppler - CTA neck performed - carotid dopplers not indicated.  2D Echo - EF 60 - 65%. No cardiac source of emboli identified.   Sars Corona Virus 2 - negative  LDL - 107  HgbA1c - 5.7  UDS - benzodiazepine  VTE prophylaxis - [place] Heparin Diet  Diet Order            Diet heart healthy/carb modified Room service appropriate? Yes; Fluid consistency: Thin  Diet effective now                  aspirin 81 mg daily prior to admission, now on aspirin 81 mg daily and clopidogrel 75 mg daily  Patient counseled to be compliant with her antithrombotic medications  Ongoing aggressive stroke risk factor management  Therapy recommendations:  pending  Disposition:  Pending  Hypertension  Home BP meds: Coreg ; Cozaar  Current BP meds: Coreg  Stable . Permissive hypertension (OK if < 220/120) but gradually normalize in 5-7 days  . Long-term BP goal normotensive  Hyperlipidemia  Home Lipid lowering medication: Crestor 5 mg daily  LDL 5.7, goal < 70  Current lipid lowering medication: Lipitor 80 mg daily  Continue statin at discharge  Diabetes  Home diabetic meds: metformin  Current diabetic meds: SSI  HgbA1c 5.7, goal < 7.0 Recent Labs    12/26/19 1019 12/26/19 1805 12/27/19 0008  GLUCAP 101* 78 85     Other Stroke Risk Factors  Former cigarette smoker - quit  Obesity, Body mass index is 43.03 kg/m., recommend weight loss, diet and exercise as appropriate   Migraines  Other Active Problems  Code status - Full code  Leukopenia - 2.9->2.4->1.9  Anemia - 11.6->9.8  Thrombocytopenia - 153->104->pending  Hypokalemia - 3.4 - supplemented  UTI -> Rocephin  Covid positive in March 2021   Hospital  day # 1  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  She is on dual antiplatelet agents but does have low platelets which is going to be tricky.  Given her age, we should have a low threshold to do TEE and loop recorder to evaluate for cardiac source of the bihemispheric events.  To contact Stroke Continuity provider, please refer to [email or web address] After hours, contact General Neurology

## 2019-12-27 NOTE — Progress Notes (Signed)
PROGRESS NOTE                                                                                                                                                                                                             Patient Demographics:    Joanna Reid, is a 54 y.o. female, DOB - [birth year], UXN:[id number]  Admit date - 12/26/2019   Admitting Physician Courage Denton Brick, MD  Outpatient Primary MD for the patient is Luking, Grace Bushy, MD  LOS - 1  Chief Complaint  Patient presents with   Code Stroke       Brief Narrative  54 y.o. female reformed smoker with PMHx of HTN, Morbid obesity, DM2, As well as  history of multiple myeloma (s/p Stem cell Transplant 02/21/20 at [facility] and currently on Maintenance Velcade every 2 weeks and Revlimid 10 mg 3 weeks on/1 week off started on 07/29/2018), who is status post screening colonoscopy on 12/25/2019 and presented with confusion and speech deficits primarily expressive aphasia on 12/26/19 to [facility], ER, she was diagnosed with UTI and stroke and transferred to [facility] for further treatment.   Subjective:    Jaelin Devincentis today has, No headache, No chest pain, No abdominal pain - No Nausea, No new weakness tingling or numbness, No Cough - SOB.  Still having some speech problems.   Assessment  & Plan :     1.  Acute ischemic left MCA territory infarct involving left parietal lobe with additional bifrontal infarcts.  Mostly presenting with expressive aphasia, currently on dual antiplatelet therapy along with full dose statin.  CTA head and neck nonacute, echocardiogram and A1c stable LDL pending.  Full stroke protocol underway.  Lab Results  Component Value Date   HGBA1C 5.7 (H) 12/25/2018   . Lab Results  Component Value Date   CHOL 193 01/22/2013   HDL 42 01/22/2013   LDLCALC 107 (H) 01/22/2013   TRIG 220 (H) 01/22/2013   CHOLHDL 4.6 01/22/2013    2.  UTI.  On Rocephin.  3.  IgG lambda multiple myeloma stage III.   S/p stem cell transplant in 2019 at [facility], currently on maintenance Velcade every 2 weeks and Revlimid 3 weeks on 1 week off.  Resume home treatment upon discharge.  4.  Leukopenia.  Due to #3 above.  5.  Hypertension.  Allow for permissive hypertension.  6.  COVID-19 in the past.  Last - 12/26/2019.  No acute issue.  9.  Morbid obesity.  BMI 43.  Follow with PCP.    10. DM type II, stable outpatient control with A1c of 5.7.  On sliding scale.  CBG (last 3)  Recent Labs    12/26/19 1019 12/26/19 1805 12/27/19 0008  GLUCAP 101* 78 85      Condition - Extremely Guarded  Family Communication  : Husband and daughter bedside.  Code Status : Full  Consults  : Neuro  Procedures  :    MRI - 1. 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 No associated hemorrhage or mass effect. 2. Additional few scattered punctate acute ischemic nonhemorrhagic infarcts involving the bilateral frontal and parietal lobes as well as the left cerebellum as above. 3. Underlying mild chronic microvascular ischemic disease with small remote right cerebellar infarcts.    TTE - 1. Left ventricular ejection fraction, by estimation, is 60 to 65%. The left ventricle has normal function. The left ventricle has no regional wall motion abnormalities. There is mild left ventricular hypertrophy of the basal-septal segment. Left  ventricular diastolic function could not be evaluated.  2. Right ventricular systolic function is normal. The right ventricular size is normal. Tricuspid regurgitation signal is inadequate for assessing PA pressure.  3. The mitral valve is normal in structure. Trivial mitral valve regurgitation. No evidence of mitral stenosis.  4. The aortic valve is normal in structure. Aortic valve regurgitation is trivial. No aortic stenosis is present.   CT & CTA - 1. 24 mL left parietal lobe  watershed region area of ischemia without defined core infarct. 2. No emergent large vessel occlusion. 3. Mild narrowing of the mid basilar artery without other significant proximal stenosis, aneurysm, or branch vessel occlusion. 4. Moderate distal small vessel disease in both the anterior and posterior circulation. 5. Moderate tortuosity of the cervical internal carotid arteries bilaterally, likely secondary to hypertension. 6. Atherosclerotic changes at the left carotid bifurcation and cavernous internal carotid arteries bilaterally without significant stenosis. 7. Multilevel degenerative changes in the cervical spine.  PUD Prophylaxis : PPI  Disposition Plan  :    Status is: Inpatient  Remains inpatient appropriate because:Unsafe d/c plan   Dispo: The patient is from: Home              Anticipated d/c is to: Home              Anticipated d/c date is: 2 days              Patient currently is medically stable to d/c.   DVT Prophylaxis  :  Heparin    Lab Results  Component Value Date   PLT 79 (L) 12/27/2019    Diet :  Diet Order            Diet heart healthy/carb modified Room service appropriate? Yes; Fluid consistency: Thin  Diet effective now                  Inpatient Medications Scheduled Meds:  acyclovir  400 mg Oral BID   allopurinol  300 mg Oral Daily   aspirin  81 mg Oral Daily   atorvastatin  80 mg Oral Daily   Chlorhexidine Gluconate Cloth  6 each Topical Q0600   clopidogrel  75 mg Oral Daily   diazepam  5 mg Oral QHS   fluticasone  2 spray Each Nare Daily   heparin  5,000 Units Subcutaneous Q8H   insulin aspart  0-5 Units Subcutaneous QHS  insulin aspart  0-6 Units Subcutaneous TID WC   magnesium oxide  600 mg Oral Daily   mupirocin ointment  1 application Nasal BID   pantoprazole  40 mg Oral Daily   [START ON 12/28/2019] potassium chloride SA  20 mEq Oral Daily   pregabalin  200 mg Oral Daily   sodium chloride flush  3 mL Intravenous  Q12H   Continuous Infusions:  sodium chloride     sodium chloride 40 mL/hr at 12/27/19 0143   cefTRIAXone (ROCEPHIN)  IV 1 g (12/26/19 1811)   PRN Meds:.sodium chloride, acetaminophen **OR** [DISCONTINUED] acetaminophen, albuterol, labetalol, [DISCONTINUED] ondansetron **OR** ondansetron (ZOFRAN) IV, polyethylene glycol, polyvinyl alcohol, sodium chloride flush, traZODone  Antibiotics  :   Anti-infectives (From admission, onward)   Start     Dose/Rate Route Frequency Ordered Stop   12/27/19 1000  acyclovir (ZOVIRAX) tablet 400 mg     Discontinue    Note to Pharmacy: TAKE (1) TABLET BY MOUTH TWICE DAILY.     400 mg Oral 2 times daily 12/27/19 0013     12/26/19 1800  cefTRIAXone (ROCEPHIN) 1 g in sodium chloride 0.9 % 100 mL IVPB     Discontinue     1 g 200 mL/hr over 30 Minutes Intravenous Every 24 hours 12/26/19 1747            Objective:   Vitals:   12/26/19 2226 12/27/19 0009 12/27/19 0337 12/27/19 0800  BP: (!) 152/88 139/82 128/73 (!) 145/81  Pulse: 65 61 62 (!) 57  Resp: 18 12 12 11   Temp: 98.4 F (36.9 C) 98.5 F (36.9 C) 98.6 F (37 C) 98 F (36.7 C)  TempSrc: Oral Oral Oral Oral  SpO2: 99% 99% 98% 97%  Weight: 113.7 kg     Height:        SpO2: 97 %  Wt Readings from Last 3 Encounters:  12/26/19 113.7 kg  12/25/19 111.5 kg  12/22/19 111.5 kg     Intake/Output Summary (Last 24 hours) at 12/27/2019 0948 Last data filed at 12/27/2019 8882 Gross per 24 hour  Intake 322.12 ml  Output --  Net 322.12 ml     Physical Exam  Awake Alert, No new F.N deficits, still have expressive aphasia and some stuttering in her speech  [place].AT,PERRAL Supple Neck,No JVD, No cervical lymphadenopathy appriciated.  Symmetrical Chest wall movement, Good air movement bilaterally, CTAB RRR,No Gallops,Rubs or new Murmurs, No Parasternal Heave +ve B.Sounds, Abd Soft, No tenderness, No organomegaly appriciated, No rebound - guarding or rigidity. No Cyanosis, Clubbing or edema,  No new Rash or bruise       Data Review:    Recent Labs  Lab 12/22/19 1040 12/26/19 1022 12/26/19 1031 12/27/19 0228  WBC 2.9* 2.4*  --  1.9*  HGB 11.4* 11.1* 11.6* 9.8*  HCT 35.6* 34.5* 34.0* 30.6*  PLT 153 104*  --  79*  MCV 97.0 95.6  --  94.4  MCH 31.1 30.7  --  30.2  MCHC 32.0 32.2  --  32.0  RDW 15.6* 15.1  --  14.9  LYMPHSABS 1.0 0.9  --   --   MONOABS 0.5 0.4  --   --   EOSABS 0.1 0.1  --   --   BASOSABS 0.0 0.0  --   --     Recent Labs  Lab 12/22/19 1040 12/26/19 1022 12/26/19 1031 12/27/19 0228  NA 142 139 143 140  K 4.0 3.8 3.9 3.4*  CL 104 106 105  107  CO2 27 25  --  23  GLUCOSE 108* 103* 103* 103*  BUN 30* 16 14 11   CREATININE 1.22* 1.04* 1.00 0.99  CALCIUM 9.7 8.8*  --  8.7*  AST 20 29  --   --   ALT 19 26  --   --   ALKPHOS 60 65  --   --   BILITOT 0.7 0.6  --   --   ALBUMIN 4.2 4.0  --   --   MG 1.9  --   --   --   INR  --  1.0  --   --     Recent Labs  Lab 12/23/19 1306 12/26/19 1145  SARSCOV2NAA NEGATIVE NEGATIVE    ------------------------------------------------------------------------------------------------------------------ No results for input(s): CHOL, HDL, LDLCALC, TRIG, CHOLHDL, LDLDIRECT in the last 72 hours.  Lab Results  Component Value Date   HGBA1C 5.7 (H) 12/25/2018   ------------------------------------------------------------------------------------------------------------------ No results for input(s): TSH, T4TOTAL, T3FREE, THYROIDAB in the last 72 hours.  Invalid input(s): FREET3 ------------------------------------------------------------------------------------------------------------------ No results for input(s): VITAMINB12, FOLATE, FERRITIN, TIBC, IRON, RETICCTPCT in the last 72 hours.  Coagulation profile Recent Labs  Lab 12/26/19 1022  INR 1.0    No results for input(s): DDIMER in the last 72 hours.  Cardiac Enzymes No results for input(s): CKMB, TROPONINI, MYOGLOBIN in the last 168  hours.  Invalid input(s): CK ------------------------------------------------------------------------------------------------------------------    Component Value Date/Time   BNP 40.0 08/09/2018 2057    Micro Results Recent Results (from the past 240 hour(s))  SARS CORONAVIRUS 2 (TAT 6-24 HRS) Nasopharyngeal Nasopharyngeal Swab     Status: None   Collection Time: 12/23/19  1:06 PM   Specimen: Nasopharyngeal Swab  Result Value Ref Range Status   SARS Coronavirus 2 NEGATIVE NEGATIVE Final    Comment: (NOTE) SARS-CoV-2 target nucleic acids are NOT DETECTED.  The SARS-CoV-2 RNA is generally detectable in upper and lower respiratory specimens during the acute phase of infection. Negative results do not preclude SARS-CoV-2 infection, do not rule out co-infections with other pathogens, and should not be used as the sole basis for treatment or other patient management decisions. Negative results must be combined with clinical observations, patient history, and epidemiological information. The expected result is Negative.  Fact Sheet for Patients: SugarRoll.be  Fact Sheet for Healthcare Providers: https://www.woods-mathews.com/  This test is not yet approved or cleared by the [place] FDA and  has been authorized for detection and/or diagnosis of SARS-CoV-2 by FDA under an Emergency Use Authorization (EUA). This EUA will remain  in effect (meaning this test can be used) for the duration of the COVID-19 declaration under Se ction 564(b)(1) of the Act, 21 U.S.C. section 360bbb-3(b)(1), unless the authorization is terminated or revoked sooner.  Performed at [facility], [place] [address], [place], [place] [address]   SARS Coronavirus 2 by RT PCR (hospital order, performed in [facility] hospital lab) Nasopharyngeal Nasopharyngeal Swab     Status: None   Collection Time: 12/26/19 11:45 AM   Specimen: Nasopharyngeal Swab  Result Value  Ref Range Status   SARS Coronavirus 2 NEGATIVE NEGATIVE Final    Comment: (NOTE) SARS-CoV-2 target nucleic acids are NOT DETECTED.  The SARS-CoV-2 RNA is generally detectable in upper and lower respiratory specimens during the acute phase of infection. The lowest concentration of SARS-CoV-2 viral copies this assay can detect is 250 copies / mL. A negative result does not preclude SARS-CoV-2 infection and should not be used as the sole basis  for treatment or other patient management decisions.  A negative result may occur with improper specimen collection / handling, submission of specimen other than nasopharyngeal swab, presence of viral mutation(s) within the areas targeted by this assay, and inadequate number of viral copies (<250 copies / mL). A negative result must be combined with clinical observations, patient history, and epidemiological information.  Fact Sheet for Patients:   StrictlyIdeas.no  Fact Sheet for Healthcare Providers: BankingDealers.co.za  This test is not yet approved or  cleared by the [place] FDA and has been authorized for detection and/or diagnosis of SARS-CoV-2 by FDA under an Emergency Use Authorization (EUA).  This EUA will remain in effect (meaning this test can be used) for the duration of the COVID-19 declaration under Section 564(b)(1) of the Act, 21 U.S.C. section 360bbb-3(b)(1), unless the authorization is terminated or revoked sooner.  Performed at [facility], [address], [place], [place] [address]   MRSA PCR Screening     Status: Abnormal   Collection Time: 12/27/19  7:10 AM   Specimen: Nasopharyngeal  Result Value Ref Range Status   MRSA by PCR POSITIVE (A) NEGATIVE Final    Comment:        The GeneXpert MRSA Assay (FDA approved for NASAL specimens only), is one component of a comprehensive MRSA colonization surveillance program. It is not intended to diagnose MRSA infection nor  to guide or monitor treatment for MRSA infections. RESULT CALLED TO, READ BACK BY AND VERIFIED WITH: RN Laney Potash [id number] 12/27/19 KB Performed at [facility] [address] [place], [place] [address]     Radiology Reports CT Code Stroke CTA Head W/WO contrast  Result Date: 12/26/2019 CLINICAL DATA:  Expressive aphasia. Last known normal at 11 p.m. last evening. EXAM: CT ANGIOGRAPHY HEAD AND NECK CT PERFUSION BRAIN TECHNIQUE: Multidetector CT imaging of the head and neck was performed using the standard protocol during bolus administration of intravenous contrast. Multiplanar CT image reconstructions and MIPs were obtained to evaluate the vascular anatomy. Carotid stenosis measurements (when applicable) are obtained utilizing NASCET criteria, using the distal internal carotid diameter as the denominator. Multiphase CT imaging of the brain was performed following IV bolus contrast injection. Subsequent parametric perfusion maps were calculated using RAPID software. CONTRAST:  100 mL Omnipaque 350 COMPARISON:  CT head without contrast 12/26/2019 FINDINGS: CTA NECK FINDINGS Aortic arch: A 3 vessel arch configuration is present. Mild atherosclerotic changes are present without significant stenosis or aneurysm. Right carotid system: The right common carotid artery is within normal limits. Bifurcation is unremarkable. Moderate tortuosity is present in the cervical right ICA without significant stenosis. Left carotid system: The left common carotid artery is within normal limits. Atherosclerotic changes are noted at the bifurcation. Moderate tortuosity is present in the cervical left ICA without significant stenosis. Vertebral arteries: The vertebral arteries are codominant. Tortuosity is noted proximally without significant stenosis. Both vertebral arteries originate from the subclavian arteries. Atherosclerotic changes are noted on the right without significant stenosis. No significant stenosis is  present in either vertebral artery in the neck. Skeleton: Multilevel degenerative changes are present cervical spine. Slight degenerative anterolisthesis is present at C2-3 and C3-4. Multilevel endplate changes are present at C4-5 most notably. No focal lytic or blastic lesions are present. Other neck: The soft tissues the neck otherwise within normal limits. No neck lesions are present. Salivary glands are within normal limits. Thyroid is normal. No significant adenopathy is present. Upper chest: The lung apices are clear. Thoracic inlet  is within normal limits. Review of the MIP images confirms the above findings CTA HEAD FINDINGS Anterior circulation: Atherosclerotic calcifications are present within the cavernous internal carotid arteries bilaterally without significant stenosis. Terminal ICA is normal bilaterally. The A1 and M1 segments are normal. The anterior communicating artery is patent. MCA bifurcations are intact. Moderate attenuation of distal branch vessels is present without a significant proximal stenosis or occlusion. No aneurysm is present. Posterior circulation: The vertebral arteries are codominant. PICA origins are visualized and normal. The left V4 segment is hypoplastic. Vertebrobasilar junction is normal. Mild narrowing is present the mid basilar artery, to 50%. Both posterior cerebral arteries originate from basilar tip. The PCA branch vessels demonstrate distal attenuation without a significant proximal stenosis or occlusion. Venous sinuses: The dural sinuses are patent. The straight sinus deep cerebral veins are intact. Cortical veins are unremarkable. Anatomic variants: None Review of the MIP images confirms the above findings CT Brain Perfusion Findings: ASPECTS: 10/10 CBF (<30%) Volume: 59m Perfusion (Tmax>6.0s) volume: 031mMismatch Volume: 2474mnfarction Location:Left parietal lobe, watershed region. IMPRESSION: 1. 24 mL left parietal lobe watershed region area of ischemia without  defined core infarct. 2. No emergent large vessel occlusion. 3. Mild narrowing of the mid basilar artery without other significant proximal stenosis, aneurysm, or branch vessel occlusion. 4. Moderate distal small vessel disease in both the anterior and posterior circulation. 5. Moderate tortuosity of the cervical internal carotid arteries bilaterally, likely secondary to hypertension. 6. Atherosclerotic changes at the left carotid bifurcation and cavernous internal carotid arteries bilaterally without significant stenosis. 7. Multilevel degenerative changes in the cervical spine. These results were called by telephone at the time of interpretation on 12/26/2019 at 11:31 am to provider Dr. TriLangston Maskerho verbally acknowledged these results. Electronically Signed   By: ChrSan MorelleD.   On: 12/26/2019 11:31   CT Code Stroke CTA Neck W/WO contrast  Result Date: 12/26/2019 CLINICAL DATA:  Expressive aphasia. Last known normal at 11 p.m. last evening. EXAM: CT ANGIOGRAPHY HEAD AND NECK CT PERFUSION BRAIN TECHNIQUE: Multidetector CT imaging of the head and neck was performed using the standard protocol during bolus administration of intravenous contrast. Multiplanar CT image reconstructions and MIPs were obtained to evaluate the vascular anatomy. Carotid stenosis measurements (when applicable) are obtained utilizing NASCET criteria, using the distal internal carotid diameter as the denominator. Multiphase CT imaging of the brain was performed following IV bolus contrast injection. Subsequent parametric perfusion maps were calculated using RAPID software. CONTRAST:  100 mL Omnipaque 350 COMPARISON:  CT head without contrast 12/26/2019 FINDINGS: CTA NECK FINDINGS Aortic arch: A 3 vessel arch configuration is present. Mild atherosclerotic changes are present without significant stenosis or aneurysm. Right carotid system: The right common carotid artery is within normal limits. Bifurcation is unremarkable. Moderate  tortuosity is present in the cervical right ICA without significant stenosis. Left carotid system: The left common carotid artery is within normal limits. Atherosclerotic changes are noted at the bifurcation. Moderate tortuosity is present in the cervical left ICA without significant stenosis. Vertebral arteries: The vertebral arteries are codominant. Tortuosity is noted proximally without significant stenosis. Both vertebral arteries originate from the subclavian arteries. Atherosclerotic changes are noted on the right without significant stenosis. No significant stenosis is present in either vertebral artery in the neck. Skeleton: Multilevel degenerative changes are present cervical spine. Slight degenerative anterolisthesis is present at C2-3 and C3-4. Multilevel endplate changes are present at C4-5 most notably. No focal lytic or blastic lesions are present. Other neck: The  soft tissues the neck otherwise within normal limits. No neck lesions are present. Salivary glands are within normal limits. Thyroid is normal. No significant adenopathy is present. Upper chest: The lung apices are clear. Thoracic inlet is within normal limits. Review of the MIP images confirms the above findings CTA HEAD FINDINGS Anterior circulation: Atherosclerotic calcifications are present within the cavernous internal carotid arteries bilaterally without significant stenosis. Terminal ICA is normal bilaterally. The A1 and M1 segments are normal. The anterior communicating artery is patent. MCA bifurcations are intact. Moderate attenuation of distal branch vessels is present without a significant proximal stenosis or occlusion. No aneurysm is present. Posterior circulation: The vertebral arteries are codominant. PICA origins are visualized and normal. The left V4 segment is hypoplastic. Vertebrobasilar junction is normal. Mild narrowing is present the mid basilar artery, to 50%. Both posterior cerebral arteries originate from basilar  tip. The PCA branch vessels demonstrate distal attenuation without a significant proximal stenosis or occlusion. Venous sinuses: The dural sinuses are patent. The straight sinus deep cerebral veins are intact. Cortical veins are unremarkable. Anatomic variants: None Review of the MIP images confirms the above findings CT Brain Perfusion Findings: ASPECTS: 10/10 CBF (<30%) Volume: 7m Perfusion (Tmax>6.0s) volume: 030mMismatch Volume: 2453mnfarction Location:Left parietal lobe, watershed region. IMPRESSION: 1. 24 mL left parietal lobe watershed region area of ischemia without defined core infarct. 2. No emergent large vessel occlusion. 3. Mild narrowing of the mid basilar artery without other significant proximal stenosis, aneurysm, or branch vessel occlusion. 4. Moderate distal small vessel disease in both the anterior and posterior circulation. 5. Moderate tortuosity of the cervical internal carotid arteries bilaterally, likely secondary to hypertension. 6. Atherosclerotic changes at the left carotid bifurcation and cavernous internal carotid arteries bilaterally without significant stenosis. 7. Multilevel degenerative changes in the cervical spine. These results were called by telephone at the time of interpretation on 12/26/2019 at 11:31 am to provider Dr. TriLangston Maskerho verbally acknowledged these results. Electronically Signed   By: ChrSan MorelleD.   On: 12/26/2019 11:31   MR BRAIN WO CONTRAST  Result Date: 12/27/2019 CLINICAL DATA:  Initial evaluation for acute neuro deficit, stroke suspected. EXAM: MRI HEAD WITHOUT CONTRAST TECHNIQUE: Multiplanar, multiecho pulse sequences of the brain and surrounding structures were obtained without intravenous contrast. COMPARISON:  Prior CTs from 12/26/2019. FINDINGS: Brain: Cerebral volume within normal limits for age. Mild chronic microvascular ischemic disease noted within the periventricular white matter and pons. Superimposed small remote right cerebellar  infarcts. Confluent area of restricted diffusion seen involving the left parietal lobe, consistent with an acute left MCA territory infarct. Finding corresponds with perfusion abnormality seen on prior exam. There are a few additional scattered foci of punctate cortical and subcortical ischemic infarcts involving the bilateral frontal and parietal lobes (series 5, images 94, 91, 89). Additional punctate acute ischemic infarct noted at the superior left cerebellum (series 5, image 67). No associated hemorrhage or mass effect about these infarcts. Gray-white matter differentiation otherwise maintained. No evidence for acute intracranial hemorrhage. Single chronic microhemorrhage noted at the left lentiform nucleus, of doubtful significance in isolation. No mass lesion, midline shift or mass effect. No hydrocephalus or extra-axial fluid collection. Pituitary gland suprasellar region normal. Midline structures intact. Vascular: Major intracranial vascular flow voids are maintained. Skull and upper cervical spine: Craniocervical junction within normal limits. Bone marrow signal intensity normal. No scalp soft tissue abnormality. Sinuses/Orbits: Globes and orbital soft tissues within normal limits. Mild scattered mucoperiosteal thickening noted within the ethmoidal air cells  and maxillary sinuses. Left-to-right nasal septal deviation noted. Trace left mastoid effusion, of doubtful significance. Inner ear structures grossly normal. Other: None. IMPRESSION: 1. 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 No associated hemorrhage or mass effect. 2. Additional few scattered punctate acute ischemic nonhemorrhagic infarcts involving the bilateral frontal and parietal lobes as well as the left cerebellum as above. 3. Underlying mild chronic microvascular ischemic disease with small remote right cerebellar infarcts.  Electronically Signed   By: Jeannine Boga M.D.   On: 12/27/2019 06:48   CT Code Stroke Cerebral Perfusion with contrast  Result Date: 12/26/2019 CLINICAL DATA:  Expressive aphasia. Last known normal at 11 p.m. last evening. EXAM: CT ANGIOGRAPHY HEAD AND NECK CT PERFUSION BRAIN TECHNIQUE: Multidetector CT imaging of the head and neck was performed using the standard protocol during bolus administration of intravenous contrast. Multiplanar CT image reconstructions and MIPs were obtained to evaluate the vascular anatomy. Carotid stenosis measurements (when applicable) are obtained utilizing NASCET criteria, using the distal internal carotid diameter as the denominator. Multiphase CT imaging of the brain was performed following IV bolus contrast injection. Subsequent parametric perfusion maps were calculated using RAPID software. CONTRAST:  100 mL Omnipaque 350 COMPARISON:  CT head without contrast 12/26/2019 FINDINGS: CTA NECK FINDINGS Aortic arch: A 3 vessel arch configuration is present. Mild atherosclerotic changes are present without significant stenosis or aneurysm. Right carotid system: The right common carotid artery is within normal limits. Bifurcation is unremarkable. Moderate tortuosity is present in the cervical right ICA without significant stenosis. Left carotid system: The left common carotid artery is within normal limits. Atherosclerotic changes are noted at the bifurcation. Moderate tortuosity is present in the cervical left ICA without significant stenosis. Vertebral arteries: The vertebral arteries are codominant. Tortuosity is noted proximally without significant stenosis. Both vertebral arteries originate from the subclavian arteries. Atherosclerotic changes are noted on the right without significant stenosis. No significant stenosis is present in either vertebral artery in the neck. Skeleton: Multilevel degenerative changes are present cervical spine. Slight degenerative anterolisthesis  is present at C2-3 and C3-4. Multilevel endplate changes are present at C4-5 most notably. No focal lytic or blastic lesions are present. Other neck: The soft tissues the neck otherwise within normal limits. No neck lesions are present. Salivary glands are within normal limits. Thyroid is normal. No significant adenopathy is present. Upper chest: The lung apices are clear. Thoracic inlet is within normal limits. Review of the MIP images confirms the above findings CTA HEAD FINDINGS Anterior circulation: Atherosclerotic calcifications are present within the cavernous internal carotid arteries bilaterally without significant stenosis. Terminal ICA is normal bilaterally. The A1 and M1 segments are normal. The anterior communicating artery is patent. MCA bifurcations are intact. Moderate attenuation of distal branch vessels is present without a significant proximal stenosis or occlusion. No aneurysm is present. Posterior circulation: The vertebral arteries are codominant. PICA origins are visualized and normal. The left V4 segment is hypoplastic. Vertebrobasilar junction is normal. Mild narrowing is present the mid basilar artery, to 50%. Both posterior cerebral arteries originate from basilar tip. The PCA branch vessels demonstrate distal attenuation without a significant proximal stenosis or occlusion. Venous sinuses: The dural sinuses are patent. The straight sinus deep cerebral veins are intact. Cortical veins are unremarkable. Anatomic variants: None Review of the MIP images confirms the above findings CT Brain Perfusion Findings: ASPECTS: 10/10 CBF (<30%) Volume: 68m Perfusion (Tmax>6.0s) volume: 061mMismatch Volume: 2461m  Infarction Location:Left parietal lobe, watershed region. IMPRESSION: 1. 24 mL left parietal lobe watershed region area of ischemia without defined core infarct. 2. No emergent large vessel occlusion. 3. Mild narrowing of the mid basilar artery without other significant proximal stenosis,  aneurysm, or branch vessel occlusion. 4. Moderate distal small vessel disease in both the anterior and posterior circulation. 5. Moderate tortuosity of the cervical internal carotid arteries bilaterally, likely secondary to hypertension. 6. Atherosclerotic changes at the left carotid bifurcation and cavernous internal carotid arteries bilaterally without significant stenosis. 7. Multilevel degenerative changes in the cervical spine. These results were called by telephone at the time of interpretation on 12/26/2019 at 11:31 am to provider Dr. Langston Masker, who verbally acknowledged these results. Electronically Signed   By: San Morelle M.D.   On: 12/26/2019 11:31   ECHOCARDIOGRAM COMPLETE  Result Date: 12/26/2019    ECHOCARDIOGRAM REPORT   Patient Name:   KACHINA NIEDERER Date of Exam: 12/26/2019 Medical Rec #:  [id number]        Height:       64.0 in Accession #:    [id number]       Weight:       253.3 lb Date of Birth:  10/26/1965        BSA:          2.163 m Patient Age:    10 years         BP:           149/74 mmHg Patient Gender: F                HR:           52 bpm. Exam Location:  [facility] Procedure: 2D Echo Indications:    stroke 434.91  History:        Patient has prior history of Echocardiogram examinations, most                 recent 12/08/2014. Risk Factors:Diabetes and Former Smoker. High                 blood pressure. cancer.  Sonographer:    Jannett Celestine RDCS (AE) Referring Phys: [id number] [facility]  Sonographer Comments: Image acquisition challenging due to patient body habitus. limited mobility IMPRESSIONS  1. Left ventricular ejection fraction, by estimation, is 60 to 65%. The left ventricle has normal function. The left ventricle has no regional wall motion abnormalities. There is mild left ventricular hypertrophy of the basal-septal segment. Left ventricular diastolic function could not be evaluated.  2. Right ventricular systolic function is normal. The right ventricular size is  normal. Tricuspid regurgitation signal is inadequate for assessing PA pressure.  3. The mitral valve is normal in structure. Trivial mitral valve regurgitation. No evidence of mitral stenosis.  4. The aortic valve is normal in structure. Aortic valve regurgitation is trivial. No aortic stenosis is present. FINDINGS  Left Ventricle: Left ventricular ejection fraction, by estimation, is 60 to 65%. The left ventricle has normal function. The left ventricle has no regional wall motion abnormalities. The left ventricular internal cavity size was normal in size. There is  mild left ventricular hypertrophy of the basal-septal segment. Left ventricular diastolic function could not be evaluated. Right Ventricle: The right ventricular size is normal. No increase in right ventricular wall thickness. Right ventricular systolic function is normal. Tricuspid regurgitation signal is inadequate for assessing PA pressure. Left Atrium: Left atrial size was normal in size. Right Atrium: Right atrial size was  normal in size. Pericardium: There is no evidence of pericardial effusion. Mitral Valve: The mitral valve is normal in structure. Normal mobility of the mitral valve leaflets. Trivial mitral valve regurgitation. No evidence of mitral valve stenosis. Tricuspid Valve: The tricuspid valve is normal in structure. Tricuspid valve regurgitation is trivial. No evidence of tricuspid stenosis. Aortic Valve: The aortic valve is normal in structure. Aortic valve regurgitation is trivial. No aortic stenosis is present. Pulmonic Valve: The pulmonic valve was normal in structure. Pulmonic valve regurgitation is not visualized. No evidence of pulmonic stenosis. Aorta: The aortic root is normal in size and structure. Venous: The inferior vena cava was not well visualized. IAS/Shunts: No atrial level shunt detected by color flow Doppler.  LEFT VENTRICLE PLAX 2D LVIDd:         4.91 cm LVIDs:         2.93 cm LV PW:         0.99 cm LV IVS:         1.18 cm LVOT diam:     2.10 cm LV SV:         58 LV SV Index:   27 LVOT Area:     3.46 cm  RIGHT VENTRICLE TAPSE (M-mode): 2.9 cm LEFT ATRIUM             Index LA diam:        3.80 cm 1.76 cm/m LA Vol (A2C):   85.0 ml 39.30 ml/m LA Vol (A4C):   56.1 ml 25.94 ml/m LA Biplane Vol: 72.8 ml 33.66 ml/m  AORTIC VALVE LVOT Vmax:   70.50 cm/s LVOT Vmean:  55.200 cm/s LVOT VTI:    0.167 m  AORTA Ao Root diam: 3.40 cm MITRAL VALVE MV Area (PHT): 1.86 cm    SHUNTS MV Decel Time: 407 msec    Systemic VTI:  0.17 m MV E velocity: 61.30 cm/s  Systemic Diam: 2.10 cm MV A velocity: 45.00 cm/s MV E/A ratio:  1.36 Fransico Him MD Electronically signed by Fransico Him MD Signature Date/Time: 12/26/2019/4:47:12 PM    Final    CT HEAD CODE STROKE WO CONTRAST  Result Date: 12/26/2019 CLINICAL DATA:  Code stroke. Sudden onset of abnormal speech beginning this morning. Last known normal was 11 p.m. last night. EXAM: CT HEAD WITHOUT CONTRAST TECHNIQUE: Contiguous axial images were obtained from the base of the skull through the vertex without intravenous contrast. COMPARISON:  CT maxillofacial 04/22/2016 FINDINGS: Brain: No acute infarct, hemorrhage, or mass lesion is present. Basal ganglia are normal. Insular ribbon is normal bilaterally. No significant white matter lesions are present. No focal cortical lesions are present. The brainstem and cerebellum are within normal limits. Vascular: No hyperdense vessel or unexpected calcification. Skull: Calvarium is intact. No focal lytic or blastic lesions are present. No significant extracranial soft tissue lesion is present. Sinuses/Orbits: The right maxillary sinus is shrunken suggesting a history of chronic disease. No active disease is present. The paranasal sinuses and mastoid air cells are otherwise clear. The globes and orbits are within normal limits. ASPECTS [facility] Stroke Program Early CT Score) - Ganglionic level infarction (caudate, lentiform nuclei, internal capsule, insula,  M1-M3 cortex): 7/7 - Supraganglionic infarction (M4-M6 cortex): 3/3 Total score (0-10 with 10 being normal): 10/10 IMPRESSION: 1. Normal CT appearance of the brain. 2. ASPECTS is 10/10. These results were called by telephone at the time of interpretation on 12/26/2019 at 10:35 am to provider [facility] , who verbally acknowledged these results. Electronically Signed  By: San Morelle M.D.   On: 12/26/2019 10:35    Time Spent in minutes  30   Lala Lund M.D on 12/27/2019 at 9:48 AM  To page go to [email or web address] - password [email or web address]

## 2019-12-27 NOTE — Progress Notes (Signed)
Off unit via bed to MRI.

## 2019-12-27 NOTE — Progress Notes (Signed)
Received patient to room 5W33 from [facility] via stretcher/[facility]. Assisted to bed and positioned for comfort. Oriented to room, bed and unit. Moderate aphasia noted. Anxious and tearful.

## 2019-12-27 NOTE — Evaluation (Signed)
Physical Therapy Evaluation Patient Details Name: Joanna Reid MRN: [id number] DOB: 26-Jun-1966 Today's Date: 12/27/2019   History of Present Illness  Pt admitted on 12/26/19 for stroke. TPA note given, outside of time window. MRI 12/27/19 acute ischemic nonhemorrhagic left MCA territory infarct involving the left parietal lobe, few scattered punctate acute ischemic nonhemorrhagic infarcts bilateral frontal and parietal lobes as wellas the left cerebellum, small remote right cerebellar infarcts. PMH Essential hypertension,  obesity, Multiple myeloma ([place]), DM, Anemia, COVID.  Clinical Impression  Pt presents with no major overall decrease in functional mobility and slight decrease in balance (pt reports this is baseline) secondary to above. PTA, pt lives with husband and children in mobile home. Today, pt able to complete all mobility supervision to independent without assistive devices. Pt demonstrated impaired communication, unsure if indicative of expressive aphasia based on exam. Educated pt that speech therapy would be in later to examine. Pt demonstrated no decreases in balance or unsafe mobility during session. Pt independent from mobility stand point and can d/c from PT acutely.    Supine 145/81 Standing 159/105 Sitting after mobility 149/94   Follow Up Recommendations No PT follow up    Equipment Recommendations  None recommended by PT    Recommendations for Other Services       Precautions / Restrictions Precautions Precautions: None Precaution Comments: watch BP Restrictions Weight Bearing Restrictions: No      Mobility  Bed Mobility Overal bed mobility: Modified Independent Bed Mobility: Supine to Sit;Sit to Supine     Supine to sit: Modified independent (Device/Increase time);HOB elevated Sit to supine: Modified independent (Device/Increase time);HOB elevated   General bed mobility comments: able to transfer EOB to sitting, no LOB able to sit EOB no  LOB  Transfers Overall transfer level: Independent Equipment used: None             General transfer comment: able to sit to stand without assistance and stand for 60mn statically without LOB  Ambulation/Gait Ambulation/Gait assistance: Independent Gait Distance (Feet): 140 Feet Assistive device: None Gait Pattern/deviations: Step-through pattern;Decreased stride length;Wide base of support Gait velocity: decreased   General Gait Details: Pt able to amb without LOB, pt appeared slightly unsteady which she attribuited to "bone on bone in knee" per husband  Stairs Stairs: Yes Stairs assistance: Supervision Stair Management: One rail Left   General stair comments: mimicked stair negotiation with 6 high knee steps in hallway  Wheelchair Mobility    Modified Rankin (Stroke Patients Only) Modified Rankin (Stroke Patients Only) Pre-Morbid Rankin Score: No symptoms Modified Rankin: Moderate disability     Balance Overall balance assessment: Mild deficits observed, not formally tested                                           Pertinent Vitals/Pain Pain Assessment: No/denies pain    Home Living Family/patient expects to be discharged to:: Private residence Living Arrangements: Spouse/significant other;Children Available Help at Discharge: Family Type of Home: Mobile home Home Access: Stairs to enter Entrance Stairs-Rails: Can reach both Entrance Stairs-Number of Steps: 2-3 Home Layout: One level Home Equipment: Cane - single point Additional Comments: Husband has cane at home    Prior Function Level of Independence: Independent               Hand Dominance        Extremity/Trunk Assessment   Upper Extremity  Assessment Upper Extremity Assessment: Defer to OT evaluation    Lower Extremity Assessment Lower Extremity Assessment: RLE deficits/detail;LLE deficits/detail RLE Deficits / Details: at least 3/5 mmt RLE Coordination:  decreased gross motor (unable to run ankle along shin) LLE Deficits / Details: at least 3/5 mmt LLE Coordination: decreased gross motor (unable to run ankle along shin)    Cervical / Trunk Assessment Cervical / Trunk Assessment: Kyphotic  Communication   Communication: Other (comment) (possible expressive difficulties)  Cognition Arousal/Alertness: Awake/alert Behavior During Therapy: WFL for tasks assessed/performed Overall Cognitive Status: Impaired/Different from baseline Area of Impairment: Orientation;Following commands;Safety/judgement                 Orientation Level: Disoriented to;Place;Situation     Following Commands: Follows multi-step commands consistently Safety/Judgement: Decreased awareness of safety;Decreased awareness of deficits     General Comments: Pt able to answer yes/no questions and write answers to questions. Pt demonstrated difficulty with wording finding throguhout session inconsistently, potential expressive aphasia(?) but unsure based on eval. Pt was able to describe where she was at, able to tell me who her family members were, able to write down that it was fathers day. Appeared surprised at her daughter telling her she had a stroke.      General Comments General comments (skin integrity, edema, etc.): Pt family present in room and engaged in session    Exercises     Assessment/Plan    PT Assessment Patent does not need any further PT services  PT Problem List         PT Treatment Interventions      PT Goals (Current goals can be found in the Care Plan section)  Acute Rehab PT Goals PT Goal Formulation: All assessment and education complete, DC therapy    Frequency     Barriers to discharge        Co-evaluation               AM-PAC PT "6 Clicks" Mobility  Outcome Measure Help needed turning from your back to your side while in a flat bed without using bedrails?: None Help needed moving from lying on your back to  sitting on the side of a flat bed without using bedrails?: None Help needed moving to and from a bed to a chair (including a wheelchair)?: None Help needed standing up from a chair using your arms (e.g., wheelchair or bedside chair)?: None Help needed to walk in hospital room?: None Help needed climbing 3-5 steps with a railing? : None 6 Click Score: 24    End of Session Equipment Utilized During Treatment: Gait belt Activity Tolerance: Patient tolerated treatment well Patient left: in bed;with call bell/phone within reach;with family/visitor present Nurse Communication: Mobility status      Time: 0812-0840 PT Time Calculation (min) (ACUTE ONLY): 28 min   Charges:   PT Evaluation $PT Eval Moderate Complexity: 1 Mod PT Treatments $Gait Training: 8-22 mins        [employer] SPT 12/27/2019   Rolland Porter 12/27/2019, 10:44 AM

## 2019-12-27 NOTE — Progress Notes (Signed)
Inpatient Rehab Admissions Coordinator:  Inpatient Rehab consult received. After reviewing pt's chart, noted that OT/ST are recommending pt continue therapy services in an outpatient setting and PT is not recommending further therapy.  Therefore, pt does not appear to be an appropriate candidate for CIR.  Will sign off on this pt.  Gayland Curry, [place], [place] Admissions Coordinator [phone]

## 2019-12-27 NOTE — Evaluation (Signed)
Speech Language Pathology Evaluation Patient Details Name: Joanna Reid MRN: [id number] DOB: May 31, 1966 Today's Date: 12/27/2019 Time: 1020-1055 SLP Time Calculation (min) (ACUTE ONLY): 35 min  Problem List:  Patient Active Problem List   Diagnosis Date Noted  . 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 H/o COVID-19--- was Positive 09/17/2019, Negative 12/23/19 AND also Neg on  12/26/19 12/26/2019  . Acute bronchiolitis due to respiratory syncytial virus (RSV) 08/11/2018  . Hypomagnesemia 08/10/2018  . Hyperlipidemia 08/09/2018  . Anemia 08/09/2018  . Leukopenia 08/09/2018  . Hypokalemia 08/09/2018  . Hypocalcemia 08/09/2018  . Lactic acidosis 08/09/2018  . Sepsis ([place]) 04/25/2018  . Goals of care, counseling/discussion 11/01/2017  . Multiple myeloma ([place]) 10/29/2017  . Hypercalcemia 10/29/2017  . Type 2 diabetes mellitus without complication, without long-term current use of insulin ([place]) 01/27/2016  . Morbid obesity due to excess calories ([place]) 01/27/2016  . Anxiety as acute reaction to exceptional stress 01/13/2015  . Attention deficit hyperactivity disorder (ADHD), 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  . Gout 01/20/2014  . GERD (gastroesophageal reflux disease) 01/20/2014  . Essential hypertension, benign 01/20/2014  . Plantar fasciitis of left foot 09/19/2011   Past Medical History:  Past Medical History:  Diagnosis Date  . Acid reflux   . Allergic rhinitis   . Cancer ([place])    multiple myeloma  . Diabetes mellitus    type 2  . Gout   . Gout   . HBP (high blood pressure)   . History of kidney stones   . Migraines    Past Surgical History:  Past Surgical History:  Procedure Laterality Date  . BREAST CYST EXCISION Left    2009 no visible scar on skin  . CESAREAN SECTION    . EXTRACORPOREAL SHOCK WAVE LITHOTRIPSY Left 10/10/2017   Procedure: LEFT EXTRACORPOREAL SHOCK WAVE LITHOTRIPSY (ESWL);  Surgeon: Bjorn Loser, MD;  Location: [facility];  Service: Urology;  Laterality: Left;  . EYE SURGERY    . HEMORRHOID SURGERY N/A 11/19/2012   Procedure: HEMORRHOIDECTOMY;  Surgeon: Jamesetta So, MD;  Location: [facility];  Service: General;  Laterality: N/A;  . kidney stones  1998  . LAPAROSCOPIC UNILATERAL SALPINGO OOPHERECTOMY  05/14/2012   Procedure: LAPAROSCOPIC UNILATERAL SALPINGO OOPHORECTOMY;  Surgeon: [name], MD;  Location: [facility];  Service: Gynecology;  Laterality: Right;  laparoscopic right salpingo-oophorectomy  . PARTIAL HYSTERECTOMY    . TONSILECTOMY, ADENOIDECTOMY, BILATERAL MYRINGOTOMY AND TUBES    . VESICOVAGINAL FISTULA CLOSURE W/ TAH     HPI:  Pt admitted on 12/26/19 for stroke. TPA note given, outside of time window. MRI 12/27/19 acute ischemic nonhemorrhagic left MCA territory infarct involving the left parietal lobe, few scattered punctate acute ischemic nonhemorrhagic infarcts bilateral frontal and parietal lobes as wellas the left cerebellum, small remote right cerebellar infarcts. PMH Essential hypertension,  obesity, Multiple myeloma ([place]), DM, Anemia, COVID.   Assessment / Plan / Recommendation Clinical Impression   Orders received for bedside swallow evaluation; however, pt passed [facility] swallow screen and has been tolerating a regular diet and thin liquids so I will defer swallow evaluation at this time.  Please feel free to re-consult should any additional concerns arise regarding pt's swallowing function.   Pt presents with a moderately severe global aphasia.  Pt has difficulty following multi-step and/or abstract commands and answering complex yes/no questions.  Her verbal expression is halting and characterized by word finding difficulty, phonemic and paraphasic errors, and perseveration.  She is aware of her verbal errors but requires mod-max assist to correct them.  Pt also presents with some possible motor planning deficits during non speech oral motor movements and when following  some commands.  SLP provided skilled education at the completion of today's evaluation regarding strategies that can help maximize pt's functional independence for functional communication such as supplementing verbal instructions or questions with gestures and facial expressions, allowing pt extra time to communicate, and providing clues to allow pt to find the word she wants to say rather than saying the word for the patient.  Pt would benefit from ST follow up while inpatient in order to maximize functional independence and reduce burden of care prior to discharge.  Anticipate that pt will need OP ST follow up at next level of care.      SLP Assessment  SLP Recommendation/Assessment: Patient needs continued Speech Lanaguage Pathology Services SLP Visit Diagnosis: Aphasia (R47.01)    Follow Up Recommendations  Outpatient SLP    Frequency and Duration min 2x/week         SLP Evaluation Cognition  Overall Cognitive Status: Impaired/Different from baseline (difficult to assess due to language deficits) Orientation Level: Oriented X4 Awareness: Impaired Awareness Impairment: Emergent impairment       Comprehension  Auditory Comprehension Overall Auditory Comprehension: Impaired Yes/No Questions: Impaired Complex Questions: 75-100% accurate Commands: Impaired Two Step Basic Commands: 50-74% accurate Conversation: Simple    Expression Expression Primary Mode of Expression: Verbal Verbal Expression Overall Verbal Expression: Impaired Initiation: No impairment Level of Generative/Spontaneous Verbalization: Phrase Repetition: Impaired Level of Impairment: Word level Naming: Impairment Responsive: 51-75% accurate Confrontation: Impaired Convergent: 50-74% accurate Divergent: 25-49% accurate Verbal Errors: Semantic paraphasias;Phonemic paraphasias;Perseveration;Aware of errors Pragmatics: No impairment   Oral / Motor  Oral Motor/Sensory Function Overall Oral Motor/Sensory  Function: Within functional limits Motor Speech Overall Motor Speech: Impaired Respiration: Within functional limits Phonation: Normal Resonance: Within functional limits Articulation: Within functional limitis Intelligibility: Intelligible Motor Planning: Impaired Level of Impairment:  (non speech oral motor movements) Motor Speech Errors: Unaware   GO                    Jermaine Neuharth, Selinda Orion 12/27/2019, 11:03 AM

## 2019-12-27 NOTE — Evaluation (Signed)
Occupational Therapy Evaluation Patient Details Name: Joanna Reid MRN: [id number] DOB: [birth year] Today's Date: 12/27/2019    History of Present Illness Pt admitted on 12/26/19 for stroke. TPA note given, outside of time window. MRI 12/27/19 acute ischemic nonhemorrhagic left MCA territory infarct involving the left parietal lobe, few scattered punctate acute ischemic nonhemorrhagic infarcts bilateral frontal and parietal lobes as wellas the left cerebellum, small remote right cerebellar infarcts. PMH Essential hypertension,  obesity, Multiple myeloma ([place]), DM, Anemia, COVID.   Clinical Impression   PTA pt living with family and fully independent for BADL/IADL. At time of evaluation, pt presents with ability to complete bed mobility and transfers at mod I level. Pts main area of deficits are RUE coordination, motor planning, and proprioception; as well as expressive communication and cognition. Pt demonstrates strength WFL, but performs poorly with finger to nose test and proprioceptive tests. When speaking, pt engages well with automatic conversation but struggles with less familiar words and numbers. Attempted to facilitate written communication for pt, which continued to be very difficult. Noted pt had decreased awareness of current deficits. Will need to continue to assess cognition due to language deficits. Educated family on helpful strategies to support pt best at home. Given current status, recommend pt f/u with OP OT for continued neurological rehab of RUE for improved independence and safety in BADL. Will continue to follow acutely per POC listed below.     Follow Up Recommendations  Outpatient OT;Other (comment) (neuro rehab, pt prefers [facility] office)    Equipment Recommendations  None recommended by OT    Recommendations for Other Services       Precautions / Restrictions Precautions Precautions: None Precaution Comments: watch BP Restrictions Weight Bearing Restrictions: No       Mobility Bed Mobility Overal bed mobility: Modified Independent Bed Mobility: Supine to Sit;Sit to Supine     Supine to sit: Modified independent (Device/Increase time);HOB elevated Sit to supine: Modified independent (Device/Increase time);HOB elevated   General bed mobility comments: able to transfer EOB to sitting, no LOB able to sit EOB no LOB  Transfers Overall transfer level: Independent Equipment used: None             General transfer comment: able to sit to stand without assistance and stand for 26mn statically without LOB    Balance Overall balance assessment: Mild deficits observed, not formally tested                                         ADL either performed or assessed with clinical judgement   ADL Overall ADL's : Needs assistance/impaired Eating/Feeding: Set up;Sitting   Grooming: Set up;Sitting   Upper Body Bathing: Set up;Sitting   Lower Body Bathing: Minimal assistance;Sit to/from stand;Sitting/lateral leans   Upper Body Dressing : Set up;Sitting   Lower Body Dressing: Minimal assistance;Sit to/from stand;Sitting/lateral leans   Toilet Transfer: Min guard;Ambulation   Toileting- Clothing Manipulation and Hygiene: Min guard;Sit to/from stand       Functional mobility during ADLs: Min guard;Cueing for safety;Cueing for sequencing       Vision Baseline Vision/History: Wears glasses Wears Glasses: At all times (pt reports she is not compliant in wearing her glasses) Patient Visual Report: No change from baseline Vision Assessment?: No apparent visual deficits     Perception     Praxis      Pertinent Vitals/Pain Pain Assessment: No/denies pain  Hand Dominance     Extremity/Trunk Assessment Upper Extremity Assessment Upper Extremity Assessment: RUE deficits/detail RUE Deficits / Details: strength in tact; decreased motor planning; decreased proprioception; decreased fine and gross motor RUE Sensation:  decreased proprioception RUE Coordination: decreased fine motor;decreased gross motor   Lower Extremity Assessment Lower Extremity Assessment: Defer to PT evaluation RLE Deficits / Details: at least 3/5 mmt RLE Coordination: decreased gross motor (unable to run ankle along shin) LLE Deficits / Details: at least 3/5 mmt LLE Coordination: decreased gross motor (unable to run ankle along shin)   Cervical / Trunk Assessment Cervical / Trunk Assessment: Kyphotic   Communication Communication Communication: Expressive difficulties   Cognition Arousal/Alertness: Awake/alert Behavior During Therapy: WFL for tasks assessed/performed Overall Cognitive Status: Impaired/Different from baseline Area of Impairment: Following commands;Safety/judgement                 Orientation Level: Disoriented to;Place;Situation     Following Commands: Follows multi-step commands with increased time Safety/Judgement: Decreased awareness of safety;Decreased awareness of deficits     General Comments: difficult to fully assess 2/2 expressive difficulties. Pt did need increased processing time and cues, but suspect this to be more motor plannign involvement. She does have a decreased awareness to her deficits, not realizing the severity of deficits in her RUE   General Comments  Pt family present in room and engaged in session    Exercises     Shoulder Instructions      Home Living Family/patient expects to be discharged to:: Private residence Living Arrangements: Spouse/significant other;Children Available Help at Discharge: Family Type of Home: Mobile home Home Access: Stairs to enter Technical brewer of Steps: 2-3 Entrance Stairs-Rails: Can reach both Home Layout: One level     Bathroom Shower/Tub: Teacher, early years/pre: Standard Bathroom Accessibility: Yes   Home Equipment: [place] - single point   Additional Comments: Husband has cane at home  Lives With:  Spouse    Prior Functioning/Environment Level of Independence: Independent                 OT Problem List: Decreased strength;Decreased knowledge of use of DME or AE;Decreased range of motion;Decreased coordination;Decreased knowledge of precautions;Decreased cognition;Impaired UE functional use      OT Treatment/Interventions: Self-care/ADL training;Therapeutic exercise;Patient/family education;Balance training;Neuromuscular education;Energy conservation;Therapeutic activities;DME and/or AE instruction;Cognitive remediation/compensation    OT Goals(Current goals can be found in the care plan section) Acute Rehab OT Goals Patient Stated Goal: return to independence OT Goal Formulation: With patient Time For Goal Achievement: 01/10/20 Potential to Achieve Goals: Good  OT Frequency: Min 2X/week   Barriers to D/C:            Co-evaluation              AM-PAC OT "6 Clicks" Daily Activity     Outcome Measure Help from another person eating meals?: None Help from another person taking care of personal grooming?: None Help from another person toileting, which includes using toliet, bedpan, or urinal?: A Little Help from another person bathing (including washing, rinsing, drying)?: A Little Help from another person to put on and taking off regular upper body clothing?: None Help from another person to put on and taking off regular lower body clothing?: A Little 6 Click Score: 21   End of Session Nurse Communication: Mobility status  Activity Tolerance: Patient tolerated treatment well Patient left: in bed;with call bell/phone within reach;with family/visitor present  OT Visit Diagnosis: Hemiplegia and hemiparesis;Other symptoms and signs  involving cognitive function;Cognitive communication deficit (R41.841) Symptoms and signs involving cognitive functions: Cerebral infarction Hemiplegia - Right/Left: Right Hemiplegia - dominant/non-dominant: Dominant Hemiplegia -  caused by: Cerebral infarction                Time: 3785-8850 OT Time Calculation (min): 34 min Charges:  OT General Charges $OT Visit: 1 Visit OT Evaluation $OT Eval Moderate Complexity: 1 Mod OT Treatments $Neuromuscular Re-education: 8-22 mins  Zenovia Jarred, MSOT, OTR/L [facility] Office Number: [phone] Pager: [phone]  Zenovia Jarred 12/27/2019, 11:24 AM

## 2019-12-28 ENCOUNTER — Telehealth (INDEPENDENT_AMBULATORY_CARE_PROVIDER_SITE_OTHER): Payer: Self-pay | Admitting: Internal Medicine

## 2019-12-28 ENCOUNTER — Telehealth (HOSPITAL_COMMUNITY): Payer: Self-pay | Admitting: Hematology

## 2019-12-28 ENCOUNTER — Encounter (HOSPITAL_COMMUNITY): Payer: Self-pay | Admitting: *Deleted

## 2019-12-28 DIAGNOSIS — U071 COVID-19: Secondary | ICD-10-CM

## 2019-12-28 LAB — CBC
HCT: 31.1 % — ABNORMAL LOW (ref 36.0–46.0)
Hemoglobin: 10.1 g/dL — ABNORMAL LOW (ref 12.0–15.0)
MCH: 30.8 pg (ref 26.0–34.0)
MCHC: 32.5 g/dL (ref 30.0–36.0)
MCV: 94.8 fL (ref 80.0–100.0)
Platelets: 93 10*3/uL — ABNORMAL LOW (ref 150–400)
RBC: 3.28 MIL/uL — ABNORMAL LOW (ref 3.87–5.11)
RDW: 14.9 % (ref 11.5–15.5)
WBC: 1.8 10*3/uL — ABNORMAL LOW (ref 4.0–10.5)
nRBC: 0 % (ref 0.0–0.2)

## 2019-12-28 LAB — BASIC METABOLIC PANEL
Anion gap: 7 (ref 5–15)
BUN: 12 mg/dL (ref 6–20)
CO2: 23 mmol/L (ref 22–32)
Calcium: 8.5 mg/dL — ABNORMAL LOW (ref 8.9–10.3)
Chloride: 111 mmol/L (ref 98–111)
Creatinine, Ser: 1.22 mg/dL — ABNORMAL HIGH (ref 0.44–1.00)
GFR calc Af Amer: 58 mL/min — ABNORMAL LOW (ref >60)
GFR calc non Af Amer: 50 mL/min — ABNORMAL LOW (ref >60)
Glucose, Bld: 96 mg/dL (ref 70–99)
Potassium: 3.8 mmol/L (ref 3.5–5.1)
Sodium: 141 mmol/L (ref 135–145)

## 2019-12-28 LAB — LIPID PANEL
Cholesterol: 99 mg/dL (ref 0–200)
HDL: 39 mg/dL — ABNORMAL LOW (ref >40)
LDL Cholesterol: 33 mg/dL (ref 0–99)
Total CHOL/HDL Ratio: 2.5 RATIO
Triglycerides: 137 mg/dL (ref <150)
VLDL: 27 mg/dL (ref 0–40)

## 2019-12-28 LAB — GLUCOSE, CAPILLARY
Glucose-Capillary: 83 mg/dL (ref 70–99)
Glucose-Capillary: 105 mg/dL — ABNORMAL HIGH (ref 70–99)
Glucose-Capillary: 100 mg/dL — ABNORMAL HIGH (ref 70–99)
Glucose-Capillary: 86 mg/dL (ref 70–99)

## 2019-12-28 LAB — SURGICAL PATHOLOGY

## 2019-12-28 MED ORDER — CARVEDILOL 25 MG PO TABS: 25.0000 mg | ORAL_TABLET | Freq: Two times a day (BID) | ORAL | Status: DC

## 2019-12-28 MED ORDER — LOSARTAN POTASSIUM 25 MG PO TABS: 25.0000 mg | ORAL_TABLET | Freq: Two times a day (BID) | ORAL | Status: DC

## 2019-12-28 MED ORDER — PANTOPRAZOLE SODIUM 40 MG PO TBEC: 40.0000 mg | DELAYED_RELEASE_TABLET | Freq: Every day | ORAL | 0 refills | Status: DC

## 2019-12-28 MED ORDER — SODIUM CHLORIDE 0.9 % IV SOLN
1.0000 g | INTRAVENOUS | Status: DC
  Administered 2019-12-28: 1 g via INTRAVENOUS
  Filled 2019-12-28: qty 10

## 2019-12-28 MED ORDER — CLOPIDOGREL BISULFATE 75 MG PO TABS: 75.0000 mg | ORAL_TABLET | Freq: Every day | ORAL | 0 refills | Status: DC

## 2019-12-28 MED ORDER — ROSUVASTATIN CALCIUM 10 MG PO TABS: 10.0000 mg | ORAL_TABLET | Freq: Every day | ORAL | 0 refills | Status: DC

## 2019-12-28 NOTE — Progress Notes (Signed)
Clinic received a call that patient had a stroke and is now in the hospital at [facility]. She is planned to be discharged to SNF for rehab prior to going home.  I advised daughter that we will hold her treatments and will follow up with her once she is discharged.  She verbalizes understanding.

## 2019-12-28 NOTE — Progress Notes (Signed)
Thank you for consult on Ms. Vint. Chart reviewed and note that patient is modified independent for mobility to supervision level on evaluation. Therapy recommending [facility] v/s outpatient therapy for follow up. Will defer CIR consult for now.

## 2019-12-28 NOTE — TOC Initial Note (Signed)
Transition of Care [facility]) - Initial/Assessment Note    Patient Details  Name: RHYLYNN PERDOMO MRN: [id number] Date of Birth: 07/12/1965  Transition of Care [facility]) CM/SW Contact:    Angelita Ingles, RN Phone Number: [phone]  12/28/2019, 11:26 AM  Clinical Narrative:                 Patient from home with husband. Spoke with husband who confirms patient has transportation to daily therapy. Outpatient therapy referral for ST/OT has been sent to [facility] outpatient therapy. Husband made aware that facility will call. Info added to AVS and husband instructed to call for any concerns regarding this therapy. CM will sign off.         Patient Goals and CMS Choice        Expected Discharge Plan and Services           Expected Discharge Date: 12/28/19                                    Prior Living Arrangements/Services                       Activities of Daily Living      Permission Sought/Granted                  Emotional Assessment              Admission diagnosis:  Stroke (cerebrum) ([place]) [I63.9] Cerebrovascular accident (CVA), unspecified mechanism ([place]) [I63.9] Patient Active Problem List   Diagnosis Date Noted  . 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 H/o COVID-19--- was Positive 09/17/2019, Negative 12/23/19 AND also Neg on  12/26/19 12/26/2019  . Acute bronchiolitis due to respiratory syncytial virus (RSV) 08/11/2018  . Hypomagnesemia 08/10/2018  . Hyperlipidemia 08/09/2018  . Anemia 08/09/2018  . Leukopenia 08/09/2018  . Hypokalemia 08/09/2018  . Hypocalcemia 08/09/2018  . Lactic acidosis 08/09/2018  . Sepsis ([place]) 04/25/2018  . Goals of care, counseling/discussion 11/01/2017  . Multiple myeloma ([place]) 10/29/2017  . Hypercalcemia 10/29/2017  . Type 2 diabetes mellitus without complication, without long-term current use of insulin ([place]) 01/27/2016  . Morbid obesity due  to excess calories ([place]) 01/27/2016  . Anxiety as acute reaction to exceptional stress 01/13/2015  . Attention deficit hyperactivity disorder (ADHD), 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  . Gout 01/20/2014  . GERD (gastroesophageal reflux disease) 01/20/2014  . Essential hypertension, benign 01/20/2014  . Plantar fasciitis of left foot 09/19/2011   PCP:  Mikey Kirschner, MD Pharmacy:   [facility], [place] [address] Phone: [phone] Fax: [phone] [place], [place] [address] [place] [address] Phone: [phone] Fax: [phone]  [facility] [place], [place] [address] [place] [address] Phone: [phone] Fax: [phone]  [facility] ([place], [place] [address] Phone: [phone] Fax: [place] [address] [place] [address] [place] [address] [place] [address] Phone: [phone] Fax: [phone]     Social Determinants of Health (SDOH) Interventions    Readmission Risk Interventions No flowsheet data found.

## 2019-12-28 NOTE — Progress Notes (Signed)
Inpatient Rehab Admissions:  Inpatient Rehab Consult received.  Please see note from [place], PA-C.  CIR will sign off at this time.   Signed: Shann Medal, PT, DPT Admissions Coordinator [phone] 12/28/19  11:01 AM

## 2019-12-28 NOTE — Progress Notes (Signed)
STROKE TEAM PROGRESS NOTE       INTERVAL HISTORY The patient's husband is at the bedside.  She continues to have expressive language difficulties.  MRI scan confirmed a small left parietal MCA branch infarct.  CT angiogram shows no significant large vessel stenosis or occlusion.  Echocardiogram is normal.  LDL cholesterol is 33 mg percent.  Hemoglobin A1c is 5.4. Platelet count is low at 79,000 yesterday and 93,000 today.  She is on chemotherapy  OBJECTIVE Vitals:   12/28/19 0333 12/28/19 0607 12/28/19 0720 12/28/19 1143  BP: (!) 144/79 (!) 178/91 (!) 169/78 (!) 158/85  Pulse: 66 60 (!) 58 (!) 48  Resp: _0 Temp: 97.8 F (36.6 C) 98 F (36.7 C) 98.1 F (36.7 C) 97.7 F (36.5 C)  TempSrc: Oral Oral Oral Oral  SpO2: 96% 97% 97% 97%  Weight:      Height:        CBC:  Recent Labs  Lab 12/22/19 1040 12/22/19 1040 12/26/19 1022 12/26/19 1031 12/27/19 0228 12/28/19 0821  WBC 2.9*   < > 2.4*   < > 1.9* 1.8*  NEUTROABS 1.4*  --  1.0*  --   --   --   HGB 11.4*   < > 11.1*   < > 9.8* 10.1*  HCT 35.6*   < > 34.5*   < > 30.6* 31.1*  MCV 97.0   < > 95.6   < > 94.4 94.8  PLT 153   < > 104*   < > 79* 93*   < > = values in this interval not displayed.    Basic Metabolic Panel:  Recent Labs  Lab 12/22/19 1040 12/26/19 1022 12/27/19 0228 12/28/19 0241  NA 142   < > 140 141  K 4.0   < > 3.4* 3.8  CL 104   < > 107 111  CO2 27   < > 23 23  GLUCOSE 108*   < > 103* 96  BUN 30*   < > 11 12  CREATININE 1.22*   < > 0.99 1.22*  CALCIUM 9.7   < > 8.7* 8.5*  MG 1.9  --   --   --    < > = values in this interval not displayed.    Lipid Panel:     Component Value Date/Time   CHOL 99 12/28/2019 0241   TRIG 137 12/28/2019 0241   HDL 39 (L) 12/28/2019 0241   CHOLHDL 2.5 12/28/2019 0241   VLDL 27 12/28/2019 0241   LDLCALC 33 12/28/2019 0241   HgbA1c:  Lab Results  Component Value Date   HGBA1C 5.4 12/26/2019   Urine Drug Screen:     Component Value Date/Time    LABOPIA NONE DETECTED 12/26/2019 1022   COCAINSCRNUR NONE DETECTED 12/26/2019 1022   LABBENZ POSITIVE (A) 12/26/2019 1022   AMPHETMU NONE DETECTED 12/26/2019 1022   THCU NONE DETECTED 12/26/2019 1022   LABBARB NONE DETECTED 12/26/2019 1022    Alcohol Level     Component Value Date/Time   ETH <10 12/26/2019 1022    IMAGING  CT Code Stroke CTA Head W/WO contrast CT Code Stroke CTA Neck W/WO contrast CT Code Stroke Cerebral Perfusion with contrast 12/26/2019 IMPRESSION:  1. 24 mL left parietal lobe watershed region area of ischemia without defined core infarct.  2. No emergent large vessel occlusion.  3. Mild narrowing of the mid basilar artery without other significant proximal stenosis, aneurysm, or branch vessel occlusion.  4.  Moderate distal small vessel disease in both the anterior and posterior circulation.  5. Moderate tortuosity of the cervical internal carotid arteries bilaterally, likely secondary to hypertension.  6. Atherosclerotic changes at the left carotid bifurcation and cavernous internal carotid arteries bilaterally without significant stenosis.  7. Multilevel degenerative changes in the cervical spine.   CT HEAD CODE STROKE WO CONTRAST 12/26/2019 IMPRESSION:  1. Normal CT appearance of the brain.  2. ASPECTS is 10/10.   ECHOCARDIOGRAM COMPLETE 12/26/2019 IMPRESSIONS   1. Left ventricular ejection fraction, by estimation, is 60 to 65%. The left ventricle has normal function. The left ventricle has no regional wall motion abnormalities. There is mild left ventricular hypertrophy of the basal-septal segment. Left ventricular diastolic function could not be evaluated.   2. Right ventricular systolic function is normal. The right ventricular size is normal. Tricuspid regurgitation signal is inadequate for assessing PA pressure.   3. The mitral valve is normal in structure. Trivial mitral valve regurgitation. No evidence of mitral stenosis.   4. The aortic valve is  normal in structure. Aortic valve regurgitation is trivial. No aortic stenosis is present.   ECG - SB rate 53 BPM. (See cardiology reading for complete details)   PHYSICAL EXAM Blood pressure (!) 158/85, pulse (!) 48, temperature 97.7 F (36.5 C), temperature source Oral, resp. rate 13, height _0  (1.626 m), weight 113.7 kg, SpO2 97 %. Pleasant middle-age Caucasian lady not in distress. . Afebrile. Head is nontraumatic. Neck is supple without bruit.    Cardiac exam no murmur or gallop. Lungs are clear to auscultation. Distal pulses are well felt.     Neurological Exam ;  Awake  Alert oriented x 3.  Nonfluent speech with word hesitancy and expressive language difficulties.  Good comprehension.  Difficulty with naming and repetition.[place] movements full without nystagmus.fundi were not visualized. Vision acuity and fields appear normal. Hearing is normal. Palatal movements are normal. Face symmetric. Tongue midline. Normal strength, tone, reflexes and coordination. Normal sensation. Gait deferred.   ASSESSMENT/PLAN [name] is a 54 y.o. female with history of multiple myeloma (s/p stem cell transplant and currently on maintenance Velcade and Revlimid), Covid positive in March, morbid obesity, DM, HTN, migraines and smoking who presented the [facility] for screening colonoscopy. She developed confusion and expressive aphasia on Saturday.  She did not receive IV t-PA due to being outside of the therapeutic time window.   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  Resultant mild word finding difficulties/mild aphasia.  Code Stroke CT Head - Normal CT appearance of the brain. ASPECTS is 10/10.     CT head - not ordered  MRI head -small left parietal MCA branch infarct.  Tiny scattered punctate bilateral frontal and parietal and left cerebellar infarcts   MRA head - not ordered  CTA H&N -  No emergent large vessel occlusion. Mild narrowing of the mid basilar artery without other  significant proximal stenosis, aneurysm, or branch vessel occlusion. Moderate distal small vessel disease in both the anterior and posterior circulation. Moderate tortuosity of the cervical internal carotid arteries bilaterally, likely secondary to hypertension. Atherosclerotic changes at the left carotid bifurcation and cavernous internal carotid arteries bilaterally without significant stenosis.  CT Perfusion - 24 mL left parietal lobe watershed region area of ischemia without defined core infarct.   Carotid Doppler - CTA neck performed - carotid dopplers not indicated.  2D Echo - EF 60 - 65%. No cardiac source of emboli identified.   [employer] Virus  2 - negative  LDL - 107  HgbA1c - 5.7  UDS - benzodiazepine  VTE prophylaxis - [place] Heparin Diet  Diet Order            Diet heart healthy/carb modified Room service appropriate? Yes; Fluid consistency: Thin  Diet effective now                 aspirin 81 mg daily prior to admission, now on aspirin 81 mg daily    Patient counseled to be compliant with her antithrombotic medications  Ongoing aggressive stroke risk factor management  Therapy recommendations:  pending  Disposition:  Pending  Hypertension  Home BP meds: Coreg ; Cozaar  Current BP meds: Coreg  Stable  Permissive hypertension (OK if < 220/120) but gradually normalize in 5-7 days   Long-term BP goal normotensive  Hyperlipidemia  Home Lipid lowering medication: Crestor 5 mg daily  LDL 5.7, goal < 70  Current lipid lowering medication: Lipitor 80 mg daily  Continue statin at discharge  Diabetes  Home diabetic meds: metformin  Current diabetic meds: SSI  HgbA1c 5.7, goal < 7.0 Recent Labs    12/27/19 2140 12/28/19 0717 12/28/19 1137  GLUCAP 88 100* 86    Other Stroke Risk Factors  Former cigarette smoker - quit  Obesity, Body mass index is 43.03 kg/m., recommend weight loss, diet and exercise as appropriate   Migraines  Other  Active Problems  Code status - Full code  Leukopenia - 2.9->2.4->1.9  Anemia - 11.6->9.8  Thrombocytopenia - 153->104->pending  Hypokalemia - 3.4 - supplemented  UTI -> Rocephin  Covid positive in March 2021   Hospital day # 2  I have personally obtained history,examined this patient, reviewed notes, independently viewed imaging studies, participated in medical decision making and plan of care.ROS completed by me personally and pertinent positives fully documented  I have made any additions or clarifications directly to the above note.  She presented with expressive aphasia with MRI showing by cerebral embolic infarcts.  Patient is on chemotherapy for breast cancer and has thrombocytopenia hence will hold off on dual antiplatelet therapy and to aspirin alone.  Chemotherapy may have to be on hold for a few months till she recovers from her stroke.  Recommend 30-day heart monitor upon discharge.  Continue ongoing therapies.  Long discussion with the patient and husband at the bedside and with Dr. Candiss Norse and answered questions.  Greater than 50% time during this 25-minute visit was spent on counseling and coordination of care about her embolic strokes and answering questions.  Stroke team will sign off.  Kindly call for questions Antony Contras, MD Medical Director [place] Pager: [phone] 12/28/2019 2:25 PM  To contact Stroke Continuity provider, please refer to [email or web address] After hours, contact General Neurology

## 2019-12-28 NOTE — Telephone Encounter (Signed)
Spoke with [facility] R[facility] today to provide additional information..the patient weight,height and BSA. RN stated she would send script over to billing to make sure there are no issues and then she will call me back to set up delivery.

## 2019-12-28 NOTE — Discharge Instructions (Signed)
Follow with Primary MD Mikey Kirschner, MD in 2-3 days   Get CBC, CMP  checked next visit within 1 week by Primary MD   Activity: As tolerated with Full fall precautions use walker/cane & assistance as needed  Disposition Home   Diet: Heart Healthy low carbohydrate  Special Instructions: If you have smoked or chewed Tobacco  in the last 2 yrs please stop smoking, stop any regular Alcohol  and or any Recreational drug use.  On your next visit with your primary care physician please Get Medicines reviewed and adjusted.  Please request your Prim.MD to go over all Hospital Tests and Procedure/Radiological results at the follow up, please get all Hospital records sent to your Prim MD by signing hospital release before you go home.  If you experience worsening of your admission symptoms, develop shortness of breath, life threatening emergency, suicidal or homicidal thoughts you must seek medical attention immediately by calling 911 or calling your MD immediately  if symptoms less severe.  You Must read complete instructions/literature along with all the possible adverse reactions/side effects for all the Medicines you take and that have been prescribed to you. Take any new Medicines after you have completely understood and accpet all the possible adverse reactions/side effects.   Do not drive, operate heavy machinery, perform activities at heights, swimming or participation in water activities or provide baby sitting services if your were admitted for syncope or siezures until you have seen by Primary MD or a Neurologist and advised to do so again.  Do not drive when taking Pain medications.  Do not take more than prescribed Pain, Sleep and Anxiety Medications

## 2019-12-28 NOTE — Telephone Encounter (Signed)
Joanna Reid from Short Stay wanted to let Dr Laural Golden know patient had a procedure done on Friday

## 2019-12-28 NOTE — Discharge Summary (Addendum)
Joanna Reid DHR:[id number] DOB: August 10, 1965 DOA: 12/26/2019  PCP: Mikey Kirschner, MD  Admit date: 12/26/2019  Discharge date: 12/28/2019  Admitted From: Home  Disposition:  Home   Recommendations for Outpatient Follow-up:   Follow up with PCP in 1-2 weeks  PCP Please obtain BMP/CBC, 2 view CXR in 1week,  (see Discharge instructions)   PCP Please follow up on the following pending results: CBC closely, needs outpatient neurology follow-up within 4 weeks.   Home Health: Outpt SLP   Equipment/Devices: None  Consultations: Neuro Discharge Condition: Stable    CODE STATUS: Full    Diet Recommendation: Heart Healthy Low Carb  Diet Order            Diet heart healthy/carb modified Room service appropriate? Yes; Fluid consistency: Thin  Diet effective now                  Chief Complaint  Patient presents with  . Code Stroke     Brief history of present illness from the day of admission and additional interim summary    54 y.o.femalereformed smoker with PMHx of HTN, Morbid obesity, DM2, As well ashistory of multiple myeloma (s/p Stem cell Transplant 02/21/20 at [facility] and currently onMaintenance Velcade every 2 weeks and Revlimid 10 mg 3 weeks on/1 week off started on 07/29/2018),who is status post screening colonoscopy on 12/25/2019 and presented with confusion and speech deficits primarily expressive aphasia on 12/26/19 to [facility] ER, she was diagnosed with UTI and stroke and transferred to [facility] for further treatment.                                                                 Hospital Course   1.  Acute ischemic left MCA territory infarct involving left parietal lobe with additional bifrontal infarcts.  Mostly presenting with expressive aphasia, currently on dual antiplatelet therapy  along with full dose statin.  CTA head and neck nonacute, echocardiogram and A1c stable LDL however it was drawn 2 days into her hospitalization on high-dose statin.  He continues to have some expressive aphasia for which she had to get outpatient speech therapy which has been ordered.  Will be discharged home at this time on dual antiplatelet therapy along with statin.  Discussed in detail with stroke physician Dr. Leonie Man, plan is to send her on aspirin and high intensity statin with close outpatient monitoring of her CBC and neurology follow-up.  Note due to her ongoing chemotherapy her platelet counts are slightly on the lower side hence we are not considering further dysrhythmia evaluation and anticoagulation at this time.  This might be reconsidered in the future.  She needs to follow-up with her PCP and neurologist closely post discharge.  Will follow with cardiology outpatient also for 30-day event monitor .  Lab Results  Component Value Date   HGBA1C 5.4 12/26/2019   Lipid Panel     Component Value Date/Time   CHOL 99 12/28/2019 0241   TRIG 137 12/28/2019 0241   HDL 39 (L) 12/28/2019 0241   CHOLHDL 2.5 12/28/2019 0241   VLDL 27 12/28/2019 0241   LDLCALC 33 12/28/2019 0241    2.  UTI.  On Rocephin.  3.  IgG lambda multiple myeloma stage III.  S/p stem cell transplant in 2019 at [facility], currently on maintenance Velcade every 2 weeks and Revlimid 3 weeks on 1 week off.  Resume home treatment upon discharge.  4.    Pancytopenia with moderate thrombocytopenia.  Due to #3 above.  PCP to monitor closely in the setting of dual antiplatelet therapy.  5.  Hypertension.  Allow for permissive hypertension.  6.  COVID-19 in the past.  Last - 12/26/2019.  No acute issue.  9.  Morbid obesity.  BMI 43.  Follow with PCP.    10. DM type II, stable outpatient control with A1c of 5.7.  Continue home regimen.   Discharge diagnosis     Principal Problem:   Stroke (cerebrum) -Left parietal  lobe, 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  Active Problems:   Essential hypertension, benign   Type 2 diabetes mellitus without complication, without long-term current use of insulin ([place])   Morbid obesity due to excess calories ([place])   Multiple myeloma ([place])   Anemia   H/o COVID-19--- was Positive 09/17/2019, Negative 12/23/19 AND also Neg on  12/26/19    Discharge instructions    Discharge Instructions    Ambulatory referral to Neurology   Complete by: As directed    Follow up in stroke clinic at [facility] with Frann Rider, NP in about 4 weeks. If not available, consider Dr. Antony Contras, Dr. Bess Harvest, or Dr. Sarina Ill.   Ambulatory referral to Occupational Therapy   Complete by: As directed    Ambulatory referral to Speech Therapy   Complete by: As directed    Discharge instructions   Complete by: As directed    Follow with Primary MD Mikey Kirschner, MD in 2-3 days   Get CBC, CMP  checked next visit within 1 week by Primary MD   Activity: As tolerated with Full fall precautions use walker/cane & assistance as needed  Disposition Home   Diet: Heart Healthy low carbohydrate  Special Instructions: If you have smoked or chewed Tobacco  in the last 2 yrs please stop smoking, stop any regular Alcohol  and or any Recreational drug use.  On your next visit with your primary care physician please Get Medicines reviewed and adjusted.  Please request your Prim.MD to go over all Hospital Tests and Procedure/Radiological results at the follow up, please get all Hospital records sent to your Prim MD by signing hospital release before you go home.  If you experience worsening of your admission symptoms, develop shortness of breath, life threatening emergency, suicidal or homicidal thoughts you must seek medical attention immediately by calling 911 or calling your MD immediately  if symptoms less severe.  You Must read complete  instructions/literature along with all the possible adverse reactions/side effects for all the Medicines you take and that have been prescribed to you. Take any new Medicines after you have completely understood and accpet all the possible adverse reactions/side effects.   Do not drive, operate heavy machinery, perform activities at heights, swimming or participation in water activities  or provide baby sitting services if your were admitted for syncope or siezures until you have seen by Primary MD or a Neurologist and advised to do so again.  Do not drive when taking Pain medications.  Do not take more than prescribed Pain, Sleep and Anxiety Medications   Increase activity slowly   Complete by: As directed       Discharge Medications   Allergies as of 12/28/2019   No Known Allergies     Medication List    STOP taking these medications   cetirizine 10 MG tablet Commonly known as: ZYRTEC   HYDROcodone-acetaminophen 5-325 MG tablet Commonly known as: NORCO/VICODIN   omeprazole 20 MG capsule Commonly known as: PRILOSEC Replaced by: pantoprazole 40 MG tablet     TAKE these medications   Abreva 10 % Crea Generic drug: Docosanol Apply 1 application topically daily as needed (for nose).   acyclovir 400 MG tablet Commonly known as: ZOVIRAX TAKE (1) TABLET BY MOUTH TWICE DAILY.   albuterol 108 (90 Base) MCG/ACT inhaler Commonly known as: VENTOLIN HFA Inhale 2 puffs into the lungs every 4 (four) hours as needed for wheezing or shortness of breath.   allopurinol 300 MG tablet Commonly known as: ZYLOPRIM TAKE 1 TABLET DAILY What changed:   how much to take  how to take this  when to take this  additional instructions   aspirin EC 81 MG tablet Take 1 tablet (81 mg total) by mouth daily.   bortezomib SQ 3.5 MG Solr Commonly known as: VELCADE Inject 1.2 mLs (3 mg total) into the skin every 14 (fourteen) days for 6 doses.   bumetanide 0.5 MG tablet Commonly known as:  BUMEX Take 0.5 mg by mouth daily.   carvedilol 25 MG tablet Commonly known as: COREG Take 1 tablet (25 mg total) by mouth 2 (two) times daily. Start taking on: December 29, 2019   diazepam 5 MG tablet Commonly known as: VALIUM Take 1 tablet (5 mg total) by mouth at bedtime.   docusate sodium 100 MG capsule Commonly known as: COLACE Take 100 mg by mouth daily.   fluticasone 50 MCG/ACT nasal spray Commonly known as: FLONASE Place 2 sprays into both nostrils daily.   lenalidomide 10 MG capsule Commonly known as: Revlimid TAKE 1 CAPSULE BY MOUTH  DAILY FOR 21 DAYS ON, THEN  7 DAYS OFF   losartan 25 MG tablet Commonly known as: COZAAR Take 1 tablet (25 mg total) by mouth in the morning and at bedtime. Start taking on: December 31, 2019 What changed: These instructions start on December 31, 2019. If you are unsure what to do until then, ask your doctor or other care provider.   Magnesium 200 MG Tabs Take 600 mg by mouth daily.   magnesium oxide 400 (241.3 Mg) MG tablet Commonly known as: MAG-OX Take 1 tablet (400 mg total) by mouth 3 (three) times daily.   metFORMIN 500 MG tablet Commonly known as: GLUCOPHAGE TAKE 1 TABLET BY MOUTH ONCE DAILY WITH BREAKFAST What changed: See the new instructions.   pantoprazole 40 MG tablet Commonly known as: PROTONIX Take 1 tablet (40 mg total) by mouth daily. Start taking on: December 29, 2019 Replaces: omeprazole 20 MG capsule   potassium chloride SA 20 MEQ tablet Commonly known as: KLOR-CON TAKE (1) TABLET BY MOUTH TWICE DAILY. What changed:   how much to take  how to take this  when to take this  additional instructions   pregabalin 200 MG capsule Commonly known  as: LYRICA TAKE (1) CAPSULE BY MOUTH TWICE DAILY. What changed: See the new instructions.   rosuvastatin 10 MG tablet Commonly known as: CRESTOR Take 1 tablet (10 mg total) by mouth daily. What changed:   medication strength  how much to take   Systane Balance 0.6 %  Soln Generic drug: Propylene Glycol Apply 1 drop to eye daily as needed (dry eye).   Trulicity 1.5 VW/9.7XY Sopn Generic drug: Dulaglutide Inject 0.75 mg into the skin every Monday.   valACYclovir 1000 MG tablet Commonly known as: VALTREX TAKE 2 TABLETS NOW, THEN 2 TABLETS 12 HOURS LATER.        Follow-up Information    Mikey Kirschner, MD. Schedule an appointment as soon as possible for a visit in 2 day(s).   Specialty: Family Medicine Why: Also follow-up with your oncologist in the next 2 to 3 days, please get your CBC monitored once a week as you are on blood thinners now and your blood counts have to be closely monitored in the light of ongoing chemotherapy. Contact information: [place] [address] [phone]        Garvin Fila, MD. Schedule an appointment as soon as possible for a visit in 4 week(s).   Specialties: Neurology, Radiology Why: CVA - office will call with appt date and time Contact information: [address] [place] [address] [phone]               Major procedures and Radiology Reports - PLEASE review detailed and final reports thoroughly  -       CT Code Stroke CTA Head W/WO contrast  Result Date: 12/26/2019 CLINICAL DATA:  Expressive aphasia. Last known normal at 11 p.m. last evening. EXAM: CT ANGIOGRAPHY HEAD AND NECK CT PERFUSION BRAIN TECHNIQUE: Multidetector CT imaging of the head and neck was performed using the standard protocol during bolus administration of intravenous contrast. Multiplanar CT image reconstructions and MIPs were obtained to evaluate the vascular anatomy. Carotid stenosis measurements (when applicable) are obtained utilizing NASCET criteria, using the distal internal carotid diameter as the denominator. Multiphase CT imaging of the brain was performed following IV bolus contrast injection. Subsequent parametric perfusion maps were calculated using RAPID software.  CONTRAST:  100 mL Omnipaque 350 COMPARISON:  CT head without contrast 12/26/2019 FINDINGS: CTA NECK FINDINGS Aortic arch: A 3 vessel arch configuration is present. Mild atherosclerotic changes are present without significant stenosis or aneurysm. Right carotid system: The right common carotid artery is within normal limits. Bifurcation is unremarkable. Moderate tortuosity is present in the cervical right ICA without significant stenosis. Left carotid system: The left common carotid artery is within normal limits. Atherosclerotic changes are noted at the bifurcation. Moderate tortuosity is present in the cervical left ICA without significant stenosis. Vertebral arteries: The vertebral arteries are codominant. Tortuosity is noted proximally without significant stenosis. Both vertebral arteries originate from the subclavian arteries. Atherosclerotic changes are noted on the right without significant stenosis. No significant stenosis is present in either vertebral artery in the neck. Skeleton: Multilevel degenerative changes are present cervical spine. Slight degenerative anterolisthesis is present at C2-3 and C3-4. Multilevel endplate changes are present at C4-5 most notably. No focal lytic or blastic lesions are present. Other neck: The soft tissues the neck otherwise within normal limits. No neck lesions are present. Salivary glands are within normal limits. Thyroid is normal. No significant adenopathy is present. Upper chest: The lung apices are clear. Thoracic inlet is within  normal limits. Review of the MIP images confirms the above findings CTA HEAD FINDINGS Anterior circulation: Atherosclerotic calcifications are present within the cavernous internal carotid arteries bilaterally without significant stenosis. Terminal ICA is normal bilaterally. The A1 and M1 segments are normal. The anterior communicating artery is patent. MCA bifurcations are intact. Moderate attenuation of distal branch vessels is present  without a significant proximal stenosis or occlusion. No aneurysm is present. Posterior circulation: The vertebral arteries are codominant. PICA origins are visualized and normal. The left V4 segment is hypoplastic. Vertebrobasilar junction is normal. Mild narrowing is present the mid basilar artery, to 50%. Both posterior cerebral arteries originate from basilar tip. The PCA branch vessels demonstrate distal attenuation without a significant proximal stenosis or occlusion. Venous sinuses: The dural sinuses are patent. The straight sinus deep cerebral veins are intact. Cortical veins are unremarkable. Anatomic variants: None Review of the MIP images confirms the above findings CT Brain Perfusion Findings: ASPECTS: 10/10 CBF (<30%) Volume: 69m Perfusion (Tmax>6.0s) volume: 06mMismatch Volume: 2481mnfarction Location:Left parietal lobe, watershed region. IMPRESSION: 1. 24 mL left parietal lobe watershed region area of ischemia without defined core infarct. 2. No emergent large vessel occlusion. 3. Mild narrowing of the mid basilar artery without other significant proximal stenosis, aneurysm, or branch vessel occlusion. 4. Moderate distal small vessel disease in both the anterior and posterior circulation. 5. Moderate tortuosity of the cervical internal carotid arteries bilaterally, likely secondary to hypertension. 6. Atherosclerotic changes at the left carotid bifurcation and cavernous internal carotid arteries bilaterally without significant stenosis. 7. Multilevel degenerative changes in the cervical spine. These results were called by telephone at the time of interpretation on 12/26/2019 at 11:31 am to provider Dr. TriLangston Maskerho verbally acknowledged these results. Electronically Signed   By: ChrSan MorelleD.   On: 12/26/2019 11:31   CT Code Stroke CTA Neck W/WO contrast  Result Date: 12/26/2019 CLINICAL DATA:  Expressive aphasia. Last known normal at 11 p.m. last evening. EXAM: CT ANGIOGRAPHY HEAD  AND NECK CT PERFUSION BRAIN TECHNIQUE: Multidetector CT imaging of the head and neck was performed using the standard protocol during bolus administration of intravenous contrast. Multiplanar CT image reconstructions and MIPs were obtained to evaluate the vascular anatomy. Carotid stenosis measurements (when applicable) are obtained utilizing NASCET criteria, using the distal internal carotid diameter as the denominator. Multiphase CT imaging of the brain was performed following IV bolus contrast injection. Subsequent parametric perfusion maps were calculated using RAPID software. CONTRAST:  100 mL Omnipaque 350 COMPARISON:  CT head without contrast 12/26/2019 FINDINGS: CTA NECK FINDINGS Aortic arch: A 3 vessel arch configuration is present. Mild atherosclerotic changes are present without significant stenosis or aneurysm. Right carotid system: The right common carotid artery is within normal limits. Bifurcation is unremarkable. Moderate tortuosity is present in the cervical right ICA without significant stenosis. Left carotid system: The left common carotid artery is within normal limits. Atherosclerotic changes are noted at the bifurcation. Moderate tortuosity is present in the cervical left ICA without significant stenosis. Vertebral arteries: The vertebral arteries are codominant. Tortuosity is noted proximally without significant stenosis. Both vertebral arteries originate from the subclavian arteries. Atherosclerotic changes are noted on the right without significant stenosis. No significant stenosis is present in either vertebral artery in the neck. Skeleton: Multilevel degenerative changes are present cervical spine. Slight degenerative anterolisthesis is present at C2-3 and C3-4. Multilevel endplate changes are present at C4-5 most notably. No focal lytic or blastic lesions are present. Other neck: The soft tissues  the neck otherwise within normal limits. No neck lesions are present. Salivary glands are  within normal limits. Thyroid is normal. No significant adenopathy is present. Upper chest: The lung apices are clear. Thoracic inlet is within normal limits. Review of the MIP images confirms the above findings CTA HEAD FINDINGS Anterior circulation: Atherosclerotic calcifications are present within the cavernous internal carotid arteries bilaterally without significant stenosis. Terminal ICA is normal bilaterally. The A1 and M1 segments are normal. The anterior communicating artery is patent. MCA bifurcations are intact. Moderate attenuation of distal branch vessels is present without a significant proximal stenosis or occlusion. No aneurysm is present. Posterior circulation: The vertebral arteries are codominant. PICA origins are visualized and normal. The left V4 segment is hypoplastic. Vertebrobasilar junction is normal. Mild narrowing is present the mid basilar artery, to 50%. Both posterior cerebral arteries originate from basilar tip. The PCA branch vessels demonstrate distal attenuation without a significant proximal stenosis or occlusion. Venous sinuses: The dural sinuses are patent. The straight sinus deep cerebral veins are intact. Cortical veins are unremarkable. Anatomic variants: None Review of the MIP images confirms the above findings CT Brain Perfusion Findings: ASPECTS: 10/10 CBF (<30%) Volume: 38m Perfusion (Tmax>6.0s) volume: 06mMismatch Volume: 2473mnfarction Location:Left parietal lobe, watershed region. IMPRESSION: 1. 24 mL left parietal lobe watershed region area of ischemia without defined core infarct. 2. No emergent large vessel occlusion. 3. Mild narrowing of the mid basilar artery without other significant proximal stenosis, aneurysm, or branch vessel occlusion. 4. Moderate distal small vessel disease in both the anterior and posterior circulation. 5. Moderate tortuosity of the cervical internal carotid arteries bilaterally, likely secondary to hypertension. 6. Atherosclerotic  changes at the left carotid bifurcation and cavernous internal carotid arteries bilaterally without significant stenosis. 7. Multilevel degenerative changes in the cervical spine. These results were called by telephone at the time of interpretation on 12/26/2019 at 11:31 am to provider Dr. TriLangston Maskerho verbally acknowledged these results. Electronically Signed   By: ChrSan MorelleD.   On: 12/26/2019 11:31   MR BRAIN WO CONTRAST  Result Date: 12/27/2019 CLINICAL DATA:  Initial evaluation for acute neuro deficit, stroke suspected. EXAM: MRI HEAD WITHOUT CONTRAST TECHNIQUE: Multiplanar, multiecho pulse sequences of the brain and surrounding structures were obtained without intravenous contrast. COMPARISON:  Prior CTs from 12/26/2019. FINDINGS: Brain: Cerebral volume within normal limits for age. Mild chronic microvascular ischemic disease noted within the periventricular white matter and pons. Superimposed small remote right cerebellar infarcts. Confluent area of restricted diffusion seen involving the left parietal lobe, consistent with an acute left MCA territory infarct. Finding corresponds with perfusion abnormality seen on prior exam. There are a few additional scattered foci of punctate cortical and subcortical ischemic infarcts involving the bilateral frontal and parietal lobes (series 5, images 94, 91, 89). Additional punctate acute ischemic infarct noted at the superior left cerebellum (series 5, image 67). No associated hemorrhage or mass effect about these infarcts. Gray-white matter differentiation otherwise maintained. No evidence for acute intracranial hemorrhage. Single chronic microhemorrhage noted at the left lentiform nucleus, of doubtful significance in isolation. No mass lesion, midline shift or mass effect. No hydrocephalus or extra-axial fluid collection. Pituitary gland suprasellar region normal. Midline structures intact. Vascular: Major intracranial vascular flow voids are  maintained. Skull and upper cervical spine: Craniocervical junction within normal limits. Bone marrow signal intensity normal. No scalp soft tissue abnormality. Sinuses/Orbits: Globes and orbital soft tissues within normal limits. Mild scattered mucoperiosteal thickening noted within the ethmoidal air cells and  maxillary sinuses. Left-to-right nasal septal deviation noted. Trace left mastoid effusion, of doubtful significance. Inner ear structures grossly normal. Other: None. IMPRESSION: 1. 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 No associated hemorrhage or mass effect. 2. Additional few scattered punctate acute ischemic nonhemorrhagic infarcts involving the bilateral frontal and parietal lobes as well as the left cerebellum as above. 3. Underlying mild chronic microvascular ischemic disease with small remote right cerebellar infarcts. Electronically Signed   By: Jeannine Boga M.D.   On: 12/27/2019 06:48   CT Code Stroke Cerebral Perfusion with contrast  Result Date: 12/26/2019 CLINICAL DATA:  Expressive aphasia. Last known normal at 11 p.m. last evening. EXAM: CT ANGIOGRAPHY HEAD AND NECK CT PERFUSION BRAIN TECHNIQUE: Multidetector CT imaging of the head and neck was performed using the standard protocol during bolus administration of intravenous contrast. Multiplanar CT image reconstructions and MIPs were obtained to evaluate the vascular anatomy. Carotid stenosis measurements (when applicable) are obtained utilizing NASCET criteria, using the distal internal carotid diameter as the denominator. Multiphase CT imaging of the brain was performed following IV bolus contrast injection. Subsequent parametric perfusion maps were calculated using RAPID software. CONTRAST:  100 mL Omnipaque 350 COMPARISON:  CT head without contrast 12/26/2019 FINDINGS: CTA NECK FINDINGS Aortic arch: A 3 vessel arch  configuration is present. Mild atherosclerotic changes are present without significant stenosis or aneurysm. Right carotid system: The right common carotid artery is within normal limits. Bifurcation is unremarkable. Moderate tortuosity is present in the cervical right ICA without significant stenosis. Left carotid system: The left common carotid artery is within normal limits. Atherosclerotic changes are noted at the bifurcation. Moderate tortuosity is present in the cervical left ICA without significant stenosis. Vertebral arteries: The vertebral arteries are codominant. Tortuosity is noted proximally without significant stenosis. Both vertebral arteries originate from the subclavian arteries. Atherosclerotic changes are noted on the right without significant stenosis. No significant stenosis is present in either vertebral artery in the neck. Skeleton: Multilevel degenerative changes are present cervical spine. Slight degenerative anterolisthesis is present at C2-3 and C3-4. Multilevel endplate changes are present at C4-5 most notably. No focal lytic or blastic lesions are present. Other neck: The soft tissues the neck otherwise within normal limits. No neck lesions are present. Salivary glands are within normal limits. Thyroid is normal. No significant adenopathy is present. Upper chest: The lung apices are clear. Thoracic inlet is within normal limits. Review of the MIP images confirms the above findings CTA HEAD FINDINGS Anterior circulation: Atherosclerotic calcifications are present within the cavernous internal carotid arteries bilaterally without significant stenosis. Terminal ICA is normal bilaterally. The A1 and M1 segments are normal. The anterior communicating artery is patent. MCA bifurcations are intact. Moderate attenuation of distal branch vessels is present without a significant proximal stenosis or occlusion. No aneurysm is present. Posterior circulation: The vertebral arteries are codominant.  PICA origins are visualized and normal. The left V4 segment is hypoplastic. Vertebrobasilar junction is normal. Mild narrowing is present the mid basilar artery, to 50%. Both posterior cerebral arteries originate from basilar tip. The PCA branch vessels demonstrate distal attenuation without a significant proximal stenosis or occlusion. Venous sinuses: The dural sinuses are patent. The straight sinus deep cerebral veins are intact. Cortical veins are unremarkable. Anatomic variants: None Review of the MIP images confirms the above findings CT Brain Perfusion Findings: ASPECTS: 10/10 CBF (<30%) Volume: 64m Perfusion (Tmax>6.0s) volume: 046mMismatch Volume: 2445mnfarction  Location:Left parietal lobe, watershed region. IMPRESSION: 1. 24 mL left parietal lobe watershed region area of ischemia without defined core infarct. 2. No emergent large vessel occlusion. 3. Mild narrowing of the mid basilar artery without other significant proximal stenosis, aneurysm, or branch vessel occlusion. 4. Moderate distal small vessel disease in both the anterior and posterior circulation. 5. Moderate tortuosity of the cervical internal carotid arteries bilaterally, likely secondary to hypertension. 6. Atherosclerotic changes at the left carotid bifurcation and cavernous internal carotid arteries bilaterally without significant stenosis. 7. Multilevel degenerative changes in the cervical spine. These results were called by telephone at the time of interpretation on 12/26/2019 at 11:31 am to provider Dr. Langston Masker, who verbally acknowledged these results. Electronically Signed   By: San Morelle M.D.   On: 12/26/2019 11:31   ECHOCARDIOGRAM COMPLETE  Result Date: 12/26/2019    ECHOCARDIOGRAM REPORT   Patient Name:   NICOLETTA HUSH Date of Exam: 12/26/2019 Medical Rec #:  [id number]        Height:       64.0 in Accession #:    [id number]       Weight:       253.3 lb Date of Birth:  October 30, 1965        BSA:          2.163 m Patient  Age:    76 years         BP:           149/74 mmHg Patient Gender: F                HR:           52 bpm. Exam Location:  [facility] Procedure: 2D Echo Indications:    stroke 434.91  History:        Patient has prior history of Echocardiogram examinations, most                 recent 12/08/2014. Risk Factors:Diabetes and Former Smoker. High                 blood pressure. cancer.  Sonographer:    Jannett Celestine RDCS (AE) Referring Phys: [id number] [facility]  Sonographer Comments: Image acquisition challenging due to patient body habitus. limited mobility IMPRESSIONS  1. Left ventricular ejection fraction, by estimation, is 60 to 65%. The left ventricle has normal function. The left ventricle has no regional wall motion abnormalities. There is mild left ventricular hypertrophy of the basal-septal segment. Left ventricular diastolic function could not be evaluated.  2. Right ventricular systolic function is normal. The right ventricular size is normal. Tricuspid regurgitation signal is inadequate for assessing PA pressure.  3. The mitral valve is normal in structure. Trivial mitral valve regurgitation. No evidence of mitral stenosis.  4. The aortic valve is normal in structure. Aortic valve regurgitation is trivial. No aortic stenosis is present. FINDINGS  Left Ventricle: Left ventricular ejection fraction, by estimation, is 60 to 65%. The left ventricle has normal function. The left ventricle has no regional wall motion abnormalities. The left ventricular internal cavity size was normal in size. There is  mild left ventricular hypertrophy of the basal-septal segment. Left ventricular diastolic function could not be evaluated. Right Ventricle: The right ventricular size is normal. No increase in right ventricular wall thickness. Right ventricular systolic function is normal. Tricuspid regurgitation signal is inadequate for assessing PA pressure. Left Atrium: Left atrial size was normal in size. Right Atrium: Right  atrial size was normal  in size. Pericardium: There is no evidence of pericardial effusion. Mitral Valve: The mitral valve is normal in structure. Normal mobility of the mitral valve leaflets. Trivial mitral valve regurgitation. No evidence of mitral valve stenosis. Tricuspid Valve: The tricuspid valve is normal in structure. Tricuspid valve regurgitation is trivial. No evidence of tricuspid stenosis. Aortic Valve: The aortic valve is normal in structure. Aortic valve regurgitation is trivial. No aortic stenosis is present. Pulmonic Valve: The pulmonic valve was normal in structure. Pulmonic valve regurgitation is not visualized. No evidence of pulmonic stenosis. Aorta: The aortic root is normal in size and structure. Venous: The inferior vena cava was not well visualized. IAS/Shunts: No atrial level shunt detected by color flow Doppler.  LEFT VENTRICLE PLAX 2D LVIDd:         4.91 cm LVIDs:         2.93 cm LV PW:         0.99 cm LV IVS:        1.18 cm LVOT diam:     2.10 cm LV SV:         58 LV SV Index:   27 LVOT Area:     3.46 cm  RIGHT VENTRICLE TAPSE (M-mode): 2.9 cm LEFT ATRIUM             Index LA diam:        3.80 cm 1.76 cm/m LA Vol (A2C):   85.0 ml 39.30 ml/m LA Vol (A4C):   56.1 ml 25.94 ml/m LA Biplane Vol: 72.8 ml 33.66 ml/m  AORTIC VALVE LVOT Vmax:   70.50 cm/s LVOT Vmean:  55.200 cm/s LVOT VTI:    0.167 m  AORTA Ao Root diam: 3.40 cm MITRAL VALVE MV Area (PHT): 1.86 cm    SHUNTS MV Decel Time: 407 msec    Systemic VTI:  0.17 m MV E velocity: 61.30 cm/s  Systemic Diam: 2.10 cm MV A velocity: 45.00 cm/s MV E/A ratio:  1.36 Fransico Him MD Electronically signed by Fransico Him MD Signature Date/Time: 12/26/2019/4:47:12 PM    Final    CT HEAD CODE STROKE WO CONTRAST  Result Date: 12/26/2019 CLINICAL DATA:  Code stroke. Sudden onset of abnormal speech beginning this morning. Last known normal was 11 p.m. last night. EXAM: CT HEAD WITHOUT CONTRAST TECHNIQUE: Contiguous axial images were obtained from  the base of the skull through the vertex without intravenous contrast. COMPARISON:  CT maxillofacial 04/22/2016 FINDINGS: Brain: No acute infarct, hemorrhage, or mass lesion is present. Basal ganglia are normal. Insular ribbon is normal bilaterally. No significant white matter lesions are present. No focal cortical lesions are present. The brainstem and cerebellum are within normal limits. Vascular: No hyperdense vessel or unexpected calcification. Skull: Calvarium is intact. No focal lytic or blastic lesions are present. No significant extracranial soft tissue lesion is present. Sinuses/Orbits: The right maxillary sinus is shrunken suggesting a history of chronic disease. No active disease is present. The paranasal sinuses and mastoid air cells are otherwise clear. The globes and orbits are within normal limits. ASPECTS [facility] Stroke Program Early CT Score) - Ganglionic level infarction (caudate, lentiform nuclei, internal capsule, insula, M1-M3 cortex): 7/7 - Supraganglionic infarction (M4-M6 cortex): 3/3 Total score (0-10 with 10 being normal): 10/10 IMPRESSION: 1. Normal CT appearance of the brain. 2. ASPECTS is 10/10. These results were called by telephone at the time of interpretation on 12/26/2019 at 10:35 am to provider [facility] , who verbally acknowledged these results. Electronically Signed   By: Harrell Gave  Mattern M.D.   On: 12/26/2019 10:35    Micro Results     Recent Results (from the past 240 hour(s))  SARS CORONAVIRUS 2 (TAT 6-24 HRS) Nasopharyngeal Nasopharyngeal Swab     Status: None   Collection Time: 12/23/19  1:06 PM   Specimen: Nasopharyngeal Swab  Result Value Ref Range Status   SARS Coronavirus 2 NEGATIVE NEGATIVE Final    Comment: (NOTE) SARS-CoV-2 target nucleic acids are NOT DETECTED.  The SARS-CoV-2 RNA is generally detectable in upper and lower respiratory specimens during the acute phase of infection. Negative results do not preclude SARS-CoV-2 infection, do not  rule out co-infections with other pathogens, and should not be used as the sole basis for treatment or other patient management decisions. Negative results must be combined with clinical observations, patient history, and epidemiological information. The expected result is Negative.  Fact Sheet for Patients: SugarRoll.be  Fact Sheet for Healthcare Providers: https://www.woods-mathews.com/  This test is not yet approved or cleared by the [place] FDA and  has been authorized for detection and/or diagnosis of SARS-CoV-2 by FDA under an Emergency Use Authorization (EUA). This EUA will remain  in effect (meaning this test can be used) for the duration of the COVID-19 declaration under Se ction 564(b)(1) of the Act, 21 U.S.C. section 360bbb-3(b)(1), unless the authorization is terminated or revoked sooner.  Performed at [facility], [place] [address], [place], [place] [address]   SARS Coronavirus 2 by RT PCR (hospital order, performed in [facility] hospital lab) Nasopharyngeal Nasopharyngeal Swab     Status: None   Collection Time: 12/26/19 11:45 AM   Specimen: Nasopharyngeal Swab  Result Value Ref Range Status   SARS Coronavirus 2 NEGATIVE NEGATIVE Final    Comment: (NOTE) SARS-CoV-2 target nucleic acids are NOT DETECTED.  The SARS-CoV-2 RNA is generally detectable in upper and lower respiratory specimens during the acute phase of infection. The lowest concentration of SARS-CoV-2 viral copies this assay can detect is 250 copies / mL. A negative result does not preclude SARS-CoV-2 infection and should not be used as the sole basis for treatment or other patient management decisions.  A negative result may occur with improper specimen collection / handling, submission of specimen other than nasopharyngeal swab, presence of viral mutation(s) within the areas targeted by this assay, and inadequate number of viral copies (<250 copies /  mL). A negative result must be combined with clinical observations, patient history, and epidemiological information.  Fact Sheet for Patients:   StrictlyIdeas.no  Fact Sheet for Healthcare Providers: BankingDealers.co.za  This test is not yet approved or  cleared by the [place] FDA and has been authorized for detection and/or diagnosis of SARS-CoV-2 by FDA under an Emergency Use Authorization (EUA).  This EUA will remain in effect (meaning this test can be used) for the duration of the COVID-19 declaration under Section 564(b)(1) of the Act, 21 U.S.C. section 360bbb-3(b)(1), unless the authorization is terminated or revoked sooner.  Performed at [facility], [name] [place], [place] [address]   MRSA PCR Screening     Status: Abnormal   Collection Time: 12/27/19  7:10 AM   Specimen: Nasopharyngeal  Result Value Ref Range Status   MRSA by PCR POSITIVE (A) NEGATIVE Final    Comment:        The GeneXpert MRSA Assay (FDA approved for NASAL specimens only), is one component of a comprehensive MRSA colonization surveillance program. It is not intended to diagnose MRSA infection nor to  guide or monitor treatment for MRSA infections. RESULT CALLED TO, READ BACK BY AND VERIFIED WITH: RN Laney Potash [id number] 12/27/19 KB Performed at [facility] [address] [place], [place] [address]     Today   Subjective    Silvana Holecek today has no headache,no chest abdominal pain,no new weakness tingling or numbness, feels much better wants to go home today    Objective   Blood pressure (!) 169/78, pulse (!) 58, temperature 98.1 F (36.7 C), temperature source Oral, resp. rate 16, height 5' 4"  (1.626 m), weight 113.7 kg, SpO2 97 %.   Intake/Output Summary (Last 24 hours) at 12/28/2019 1124 Last data filed at 12/28/2019 1027 Gross per 24 hour  Intake 400 ml  Output --  Net 400 ml    Exam  Awake Alert, No new  F.N deficits, still has expressive aphasia, normal affect  [place].AT,PERRAL Supple Neck,No JVD, No cervical lymphadenopathy appriciated.  Symmetrical Chest wall movement, Good air movement bilaterally, CTAB RRR,No Gallops,Rubs or new Murmurs, No Parasternal Heave +ve B.Sounds, Abd Soft, Non tender, No organomegaly appriciated, No rebound -guarding or rigidity. No Cyanosis, Clubbing or edema, No new Rash or bruise   Data Review   CBC w Diff:  Lab Results  Component Value Date   WBC 1.8 (L) 12/28/2019   HGB 10.1 (L) 12/28/2019   HGB 14.2 08/02/2015   HCT 31.1 (L) 12/28/2019   HCT 41.1 08/02/2015   PLT 93 (L) 12/28/2019   PLT 371 08/02/2015   LYMPHOPCT 38 12/26/2019   MONOPCT 17 12/26/2019   EOSPCT 4 12/26/2019   BASOPCT 1 12/26/2019    CMP:  Lab Results  Component Value Date   NA 141 12/28/2019   NA 143 08/02/2015   K 3.8 12/28/2019   CL 111 12/28/2019   CO2 23 12/28/2019   BUN 12 12/28/2019   BUN 12 08/02/2015   CREATININE 1.22 (H) 12/28/2019   CREATININE 0.69 12/08/2014   PROT 7.0 12/26/2019   PROT 7.2 08/02/2015   ALBUMIN 4.0 12/26/2019   ALBUMIN 4.3 08/02/2015   BILITOT 0.6 12/26/2019   BILITOT 0.3 08/02/2015   ALKPHOS 65 12/26/2019   AST 29 12/26/2019   ALT 26 12/26/2019  .   Total Time in preparing paper work, data evaluation and todays exam - 45 minutes  Lala Lund M.D on 12/28/2019 at 11:24 AM  [facility]   Office  [phone]

## 2019-12-30 ENCOUNTER — Encounter (HOSPITAL_COMMUNITY): Payer: Self-pay | Admitting: Speech Pathology

## 2019-12-30 ENCOUNTER — Other Ambulatory Visit: Payer: Self-pay

## 2019-12-30 ENCOUNTER — Other Ambulatory Visit: Payer: Self-pay | Admitting: Cardiology

## 2019-12-30 ENCOUNTER — Ambulatory Visit (HOSPITAL_COMMUNITY): Payer: BC Managed Care – PPO | Admitting: Speech Pathology

## 2019-12-30 DIAGNOSIS — R4701 Aphasia: Secondary | ICD-10-CM

## 2019-12-30 DIAGNOSIS — R41841 Cognitive communication deficit: Secondary | ICD-10-CM

## 2019-12-30 DIAGNOSIS — I639 Cerebral infarction, unspecified: Secondary | ICD-10-CM

## 2019-12-30 NOTE — Therapy (Signed)
[place], [place], [address] Phone: [phone]   Fax:  [phone]  Speech Language Pathology Evaluation  Patient Details  Name: Joanna Reid MRN: [id number] Date of Birth: July 06, 1966 Referring Provider (SLP): Lala Lund, MD   Encounter Date: 12/30/2019   End of Session - 12/30/19 1114    Visit Number 1    Number of Visits 16           Past Medical History:  Diagnosis Date  . Acid reflux   . Allergic rhinitis   . Cancer ([place])    multiple myeloma  . Diabetes mellitus    type 2  . Gout   . Gout   . HBP (high blood pressure)   . History of kidney stones   . Migraines     Past Surgical History:  Procedure Laterality Date  . BREAST CYST EXCISION Left    2009 no visible scar on skin  . CESAREAN SECTION    . EXTRACORPOREAL SHOCK WAVE LITHOTRIPSY Left 10/10/2017   Procedure: LEFT EXTRACORPOREAL SHOCK WAVE LITHOTRIPSY (ESWL);  Surgeon: Bjorn Loser, MD;  Location: [facility];  Service: Urology;  Laterality: Left;  . EYE SURGERY    . HEMORRHOID SURGERY N/A 11/19/2012   Procedure: HEMORRHOIDECTOMY;  Surgeon: Jamesetta So, MD;  Location: [facility];  Service: General;  Laterality: N/A;  . kidney stones  1998  . LAPAROSCOPIC UNILATERAL SALPINGO OOPHERECTOMY  05/14/2012   Procedure: LAPAROSCOPIC UNILATERAL SALPINGO OOPHORECTOMY;  Surgeon: [name], MD;  Location: [facility];  Service: Gynecology;  Laterality: Right;  laparoscopic right salpingo-oophorectomy  . PARTIAL HYSTERECTOMY    . TONSILECTOMY, ADENOIDECTOMY, BILATERAL MYRINGOTOMY AND TUBES    . VESICOVAGINAL FISTULA CLOSURE W/ TAH      There were no vitals filed for this visit.       SLP Evaluation [facility] - 12/30/19 1122      SLP Visit Information   SLP Received On 12/30/19    Referring Provider (SLP) Lala Lund, MD    Onset Date 12/26/19    Medical Diagnosis CVA      Subjective   Subjective "I wanna make sure that..."     Patient/Family Stated Goal Improve speech      General Information   HPI Pt admitted on 12/26/19 for stroke. TPA note given, outside of time window. MRI 12/27/19 acute ischemic nonhemorrhagic left MCA territory infarct involving the left parietal lobe, few scattered punctate acute ischemic nonhemorrhagic infarcts bilateral frontal and parietal lobes as wellas the left cerebellum, small remote right cerebellar infarcts. PMH Essential hypertension,  obesity, Multiple myeloma ([place]), DM, Anemia, COVID. Pt is referred for outpatient SLP evaluation and treatment for aphasia from acute CVA.    Behavioral/Cognition Alert and cooperative    Mobility Status ambulatory      Balance Screen   Has the patient fallen in the past 6 months No    Has the patient had a decrease in activity level because of a fear of falling?  No    Is the patient reluctant to leave their home because of a fear of falling?  No      Prior Functional Status   Cognitive/Linguistic Baseline Within functional limits    Type of Home Mobile home     Lives With Spouse    Available Support Family    Education some college    Vocation On disability      Pain Assessment   Pain Assessment No/denies pain  Cognition   Overall Cognitive Status Within Functional Limits for tasks assessed    Memory Appears intact    Awareness Appears intact    Problem Solving Appears intact      Auditory Comprehension   Overall Auditory Comprehension Impaired    Yes/No Questions Impaired    Complex Questions 75-100% accurate    Commands Impaired    Two Step Basic Commands 75-100% accurate    Multistep Basic Commands 25-49% accurate    Conversation Moderately complex    EffectiveTechniques Extra processing time;Stressing words;Repetition;Visual/Gestural cues      Visual Recognition/Discrimination   Discrimination Within Function Limits      Reading Comprehension   Reading Status Impaired    Word level 76-100% accurate    Sentence Level  51-75% accurate    Paragraph Level Not tested    Functional Environmental (signs, name badge) Within functional limits    Effective Techniques Eye glasses      Expression   Primary Mode of Expression Verbal      Verbal Expression   Overall Verbal Expression Impaired    Initiation No impairment    Automatic Speech Name;Social Response    Level of Generative/Spontaneous Verbalization Phrase    Repetition Impaired    Level of Impairment Sentence level    Naming Impairment    Responsive 51-75% accurate    Confrontation 75-100% accurate    Convergent 75-100% accurate    Divergent 50-74% accurate    Verbal Errors Phonemic paraphasias;Aware of errors;Not aware of errors;Inconsistent    Pragmatics No impairment    Effective Techniques Phonemic cues;Written cues;Sentence completion    Non-Verbal Means of Communication Not applicable      Written Expression   Dominant Hand Right    Written Expression Exceptions to [facility]    Copy Ability Word      Oral Motor/Sensory Function   Overall Oral Motor/Sensory Function Appears within functional limits for tasks assessed      Motor Speech   Overall Motor Speech Appears within functional limits for tasks assessed    Respiration Within functional limits    Phonation Normal    Resonance Within functional limits    Articulation Within functional limitis    Intelligibility Intelligible    Motor Planning Witnin functional limits    Phonation WFL      Standardized Assessments   Standardized Assessments  Other Assessment   Portions of WAB, BNT, informal measures            SLP Education - 12/30/19 1209    Education Details Provided folder with word finding strategies, template for communication support, and single word sentence completion task    Person(s) Educated Patient;Spouse    Methods Explanation;Handout    Comprehension Verbalized understanding            SLP Short Term Goals - 12/30/19 1211      SLP SHORT TERM GOAL #1    Title Pt will implement word-finding strategies with 80% accuracy when unable to verbalize desired word in conversation/functional tasks with mod assist.    Baseline ~60%    Time 4    Period Weeks    Status New    Target Date 01/29/20      SLP SHORT TERM GOAL #2   Title Pt will describe objects and pictures by providing at least three salient features as judged by clinician with 90% acc when provided min cues.    Baseline 66%    Time 4    Period Weeks  Status New    Target Date 01/29/20      SLP SHORT TERM GOAL #3   Title Pt will increase divergent naming to 7+ items per concrete category with min cues.    Baseline 5    Time 4    Period Weeks    Status New    Target Date 01/29/20      SLP SHORT TERM GOAL #4   Title Pt will complete basic sentence level reading comprehension tasks with 80% acc and min cues for error awareness.    Baseline 65% sentence level with multiple choice response    Time 4    Period Weeks    Status New    Target Date 01/29/20      SLP SHORT TERM GOAL #5   Title Pt will be able to write personal/bio information with 90% acc when provided mod cues    Baseline Able to copy name only    Time 4    Period Weeks    Status New    Target Date 01/29/20      Additional Short Term Goals   Additional Short Term Goals Yes      SLP SHORT TERM GOAL #6   Title Pt will respond to open-ended questions using a complete sentence with 80% acc and min cues.    Baseline telegraphic responses    Time 4    Period Weeks    Status New    Target Date 01/29/20            SLP Long Term Goals - 12/30/19 1128      SLP LONG TERM GOAL #1   Title Pt will communicate moderately complex thoughts and feelings to [facility] with use of strategies as needed.    Baseline mod assist    Time 8    Period Weeks    Status New    Target Date 02/29/20            Plan - 12/30/19 1122    Clinical Impression Statement Pt presents with moderate expressive aphasia characterized by  deficits in responsive and divergent naming, dysnomia and reduced thought organization during conversational speech with good attempts at circumlocution strategies, dyscalculia, reading comprehension (phrase level), and written expression. Pt appears to have made great improvement in auditory comprehension since acute stay. Pt will benefit from skilled SLP in order to address the above impairments, maximize independence, and decrease burden of care. She has excellent family support and is highly motivated to increase her expressive language skills.        Speech Therapy Frequency 2x / week    Duration Other (comment)   8 weeks   Treatment/Interventions SLP instruction and feedback;Internal/external aids;Compensatory strategies;Compensatory techniques;Patient/family education;Language facilitation;Cueing hierarchy;Multimodal communcation approach    Potential to Achieve Goals Good    SLP Home Exercise Plan Pt will complete HEP as assigned to facilitate carryover of treatment strategies and techniques in home environment with assist from spouse as needed.    Consulted and Agree with Plan of Care Patient;Family member/caregiver    Family Member Consulted Husband, [name]           Patient will benefit from skilled therapeutic intervention in order to improve the following deficits and impairments:   Aphasia  Cognitive communication deficit    Problem List Patient Active Problem List   Diagnosis Date Noted  . 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 H/o COVID-19--- was Positive 09/17/2019,  Negative 12/23/19 AND also Neg on  12/26/19 12/26/2019  . Acute bronchiolitis due to respiratory syncytial virus (RSV) 08/11/2018  . Hypomagnesemia 08/10/2018  . Hyperlipidemia 08/09/2018  . Anemia 08/09/2018  . Leukopenia 08/09/2018  . Hypokalemia 08/09/2018  . Hypocalcemia 08/09/2018  . Lactic acidosis 08/09/2018  . Sepsis ([place]) 04/25/2018  .  Goals of care, counseling/discussion 11/01/2017  . Multiple myeloma ([place]) 10/29/2017  . Hypercalcemia 10/29/2017  . Type 2 diabetes mellitus without complication, without long-term current use of insulin ([place]) 01/27/2016  . Morbid obesity due to excess calories ([place]) 01/27/2016  . Anxiety as acute reaction to exceptional stress 01/13/2015  . Attention deficit hyperactivity disorder (ADHD), 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  . Gout 01/20/2014  . GERD (gastroesophageal reflux disease) 01/20/2014  . Essential hypertension, benign 01/20/2014  . Plantar fasciitis of left foot 09/19/2011   Thank you,  Genene Churn, [place]  [facility] 12/30/2019, 12:21 PM  [place], [place], [address] Phone: [phone]   Fax:  [phone]  Name: Joanna Reid MRN: [id number] Date of Birth: [birth year]

## 2019-12-31 ENCOUNTER — Ambulatory Visit: Payer: BC Managed Care – PPO | Admitting: Family Medicine

## 2019-12-31 ENCOUNTER — Encounter: Payer: Self-pay | Admitting: Family Medicine

## 2019-12-31 VITALS — BP 122/78 | HR 68 | Temp 97.6°F | Ht 64.0 in | Wt 251.4 lb

## 2019-12-31 DIAGNOSIS — I639 Cerebral infarction, unspecified: Secondary | ICD-10-CM

## 2019-12-31 DIAGNOSIS — E119 Type 2 diabetes mellitus without complications: Secondary | ICD-10-CM

## 2019-12-31 DIAGNOSIS — D649 Anemia, unspecified: Secondary | ICD-10-CM

## 2019-12-31 DIAGNOSIS — C9001 Multiple myeloma in remission: Secondary | ICD-10-CM | POA: Diagnosis not present

## 2019-12-31 NOTE — Progress Notes (Signed)
Patient ID: Joanna Reid, female    DOB: Dec 01, 1965, 54 y.o.   MRN: [id number]   Chief Complaint  Patient presents with  . Hospitalization Follow-up    stroke- doin better- has follow up with neurology 7//20/21   Subjective:    HPI Pt seen with husband for hosp discharge for dx of CVA. Went to ER for a severe headache and was dx with CVA on left MCA affecting left parietal area. hospital admission- 6/19-6/21/21. Left parietal region- and distal left M3 stroke. Had stem cell transplant and seeing [facility]. Has h/o HTN and DM2.  Had stem cell. 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 No numbness, weakness in arms/legs, no facial droop.   Pt stating she was off 2 of her medications prior to the colonoscopy. Was off the 43m asa and metformin.  Pt was off her metformin for 2 days due to the colonoscopy.  Residual effects with expressive aphasia. Rt hand slight- slower reaction. No problems with movement.  Currently on- Statin, plavix and baby asa.   Medical History [place] a past medical history of Acid reflux, Allergic rhinitis, Cancer [place], Diabetes mellitus, Gout, Gout, HBP (high blood pressure), History of kidney stones, and Migraines.   Outpatient Encounter Medications as of 12/31/2019  Medication Sig  . acyclovir (ZOVIRAX) 400 MG tablet TAKE (1) TABLET BY MOUTH TWICE DAILY.  .Marland Kitchenalbuterol (PROVENTIL HFA;VENTOLIN HFA) 108 (90 Base) MCG/ACT inhaler Inhale 2 puffs into the lungs every 4 (four) hours as needed for wheezing or shortness of breath. (Patient not taking: Reported on 12/22/2019)  . allopurinol (ZYLOPRIM) 300 MG tablet TAKE 1 TABLET DAILY (Patient taking differently: Take 300 mg by mouth daily. )  . aspirin EC 81 MG tablet Take 1 tablet (81 mg total) by mouth daily.  . bortezomib SQ (VELCADE) 3.5 MG SOLR Inject 1.2 mLs (3 mg total) into the skin every 14 (fourteen) days for 6 doses.  . bumetanide (BUMEX) 0.5 MG tablet Take 0.5 mg by mouth daily.   .  carvedilol (COREG) 25 MG tablet Take 1 tablet (25 mg total) by mouth 2 (two) times daily.  . diazepam (VALIUM) 5 MG tablet Take 1 tablet (5 mg total) by mouth at bedtime.  . Docosanol (ABREVA) 10 % CREA Apply 1 application topically daily as needed (for nose).  .Marland Kitchendocusate sodium (COLACE) 100 MG capsule Take 100 mg by mouth daily.   . fluticasone (FLONASE) 50 MCG/ACT nasal spray Place 2 sprays into both nostrils daily.  .Marland Kitchenlenalidomide (REVLIMID) 10 MG capsule TAKE 1 CAPSULE BY MOUTH  DAILY FOR 21 DAYS ON, THEN  7 DAYS OFF  . losartan (COZAAR) 25 MG tablet Take 1 tablet (25 mg total) by mouth in the morning and at bedtime.  . Magnesium 200 MG TABS Take 600 mg by mouth daily.  . magnesium oxide (MAG-OX) 400 (241.3 Mg) MG tablet Take 1 tablet (400 mg total) by mouth 3 (three) times daily. (Patient not taking: Reported on 12/26/2019)  . metFORMIN (GLUCOPHAGE) 500 MG tablet TAKE 1 TABLET BY MOUTH ONCE DAILY WITH BREAKFAST (Patient taking differently: Take 1,000 mg by mouth 2 (two) times daily with a meal. )  . pantoprazole (PROTONIX) 40 MG tablet Take 1 tablet (40 mg total) by mouth daily.  . potassium chloride SA (KLOR-CON) 20 MEQ tablet TAKE (1) TABLET BY MOUTH TWICE DAILY. (Patient taking differently: Take 20 mEq by mouth daily. )  . pregabalin (LYRICA) 200 MG capsule TAKE (1) CAPSULE BY MOUTH TWICE  DAILY. (Patient taking differently: Take 200 mg by mouth daily. )  . Propylene Glycol (SYSTANE BALANCE) 0.6 % SOLN Apply 1 drop to eye daily as needed (dry eye).  . rosuvastatin (CRESTOR) 10 MG tablet Take 1 tablet (10 mg total) by mouth daily.  . TRULICITY 1.5 TW/6.5KC SOPN Inject 0.75 mg into the skin every Monday.   . valACYclovir (VALTREX) 1000 MG tablet TAKE 2 TABLETS NOW, THEN 2 TABLETS 12 HOURS LATER.   No facility-administered encounter medications on file as of 12/31/2019.     Review of Systems  Constitutional: Negative for chills and fever.  HENT: Negative for congestion, rhinorrhea and sore  throat.   Respiratory: Negative for cough, shortness of breath and wheezing.   Cardiovascular: Negative for chest pain and leg swelling.  Gastrointestinal: Negative for abdominal pain, diarrhea, nausea and vomiting.  Genitourinary: Negative for dysuria and frequency.  Musculoskeletal: Negative for arthralgias and back pain.  Skin: Negative for rash.  Neurological: Positive for speech difficulty. Negative for dizziness, weakness and headaches.     Vitals BP 122/78   Pulse 68   Temp 97.6 F (36.4 C) (Oral)   Ht 5' 4" (1.626 m)   Wt 251 lb 6.4 oz (114 kg)   SpO2 99%   BMI 43.15 kg/m   Objective:   Physical Exam Vitals and nursing note reviewed.  Constitutional:      General: She is not in acute distress.    Appearance: Normal appearance. She is not ill-appearing.  HENT:     Head: Normocephalic and atraumatic.     Nose: Nose normal.     Mouth/Throat:     Mouth: Mucous membranes are moist.     Pharynx: Oropharynx is clear.  Eyes:     Extraocular Movements: Extraocular movements intact.     Conjunctiva/sclera: Conjunctivae normal.     Pupils: Pupils are equal, round, and reactive to light.  Cardiovascular:     Rate and Rhythm: Normal rate and regular rhythm.     Pulses: Normal pulses.     Heart sounds: Normal heart sounds.  Pulmonary:     Effort: Pulmonary effort is normal.     Breath sounds: Normal breath sounds. No wheezing, rhonchi or rales.  Musculoskeletal:        General: Normal range of motion.     Right lower leg: No edema.     Left lower leg: No edema.  Skin:    General: Skin is warm and dry.     Findings: No lesion or rash.  Neurological:     General: No focal deficit present.     Mental Status: She is alert and oriented to person, place, and time.     Cranial Nerves: No cranial nerve deficit.     Sensory: No sensory deficit.     Motor: No weakness.     Gait: Gait normal.     Comments: +expressive aphasia  Psychiatric:        Mood and Affect: Mood  normal.        Behavior: Behavior normal.     Assessment and Plan   1. Cerebrovascular accident (CVA), unspecified mechanism ([place]) - CBC - Basic metabolic panel  2. Type 2 diabetes mellitus without complication, without long-term current use of insulin ([facility]) - Hemoglobin A1c; Future  3. Anemia, unspecified type - Hemoglobin and hematocrit, blood; Future  4. Multiple myeloma in remission [facility])    CVA- improving and pt seeing speech therapy for expressive aphasia.  H/o Multiple myeloma-  Seeing oncology, but discussed with them that they are holding her chemotherapy tx for 1 month.  Normally getting q2wk labs with them. Upon dc of hospital- hospitalist wanting cbc checked since pt anemic and on plavix.  Anemia- Hb 10 at dc from hospital. Over the past year Hb ranged btw 10-11.  Advising to wait on covid vaccine till seeing oncology, pt had covid in 3/21.    DM2- Cont metformin 1x per day and trulicity.  Will check labs and decide if pt needing to increase metformin.  Cr slightly elevated at 1.22 at dc from hospital.   HTN- 1101m losartan in 2020- but got reduced in fall of 2020 to 257mlosartan, due to starting coreg.  Call pt after seeing Cr to decide which dose to go back to for metformin.  Cont with f/u with neuro on 01/26/20.  Pt to check cbc and cmp today, and in 2 wks hb/hct check.  F/u in 21m11mo prn.

## 2020-01-01 ENCOUNTER — Telehealth (HOSPITAL_COMMUNITY): Payer: Self-pay | Admitting: Hematology

## 2020-01-01 LAB — CBC
Hematocrit: 32.3 % — ABNORMAL LOW (ref 34.0–46.6)
Hemoglobin: 10.9 g/dL — ABNORMAL LOW (ref 11.1–15.9)
MCH: 31.1 pg (ref 26.6–33.0)
MCHC: 33.7 g/dL (ref 31.5–35.7)
MCV: 92 fL (ref 79–97)
Platelets: 174 10*3/uL (ref 150–450)
RBC: 3.51 x10E6/uL — ABNORMAL LOW (ref 3.77–5.28)
RDW: 15.9 % — ABNORMAL HIGH (ref 11.7–15.4)
WBC: 2.6 10*3/uL — ABNORMAL LOW (ref 3.4–10.8)

## 2020-01-01 LAB — BASIC METABOLIC PANEL
BUN/Creatinine Ratio: 15 (ref 9–23)
BUN: 19 mg/dL (ref 6–24)
CO2: 23 mmol/L (ref 20–29)
Calcium: 9.8 mg/dL (ref 8.7–10.2)
Chloride: 104 mmol/L (ref 96–106)
Creatinine, Ser: 1.24 mg/dL — ABNORMAL HIGH (ref 0.57–1.00)
GFR calc Af Amer: 57 mL/min/{1.73_m2} — ABNORMAL LOW (ref >59)
GFR calc non Af Amer: 49 mL/min/{1.73_m2} — ABNORMAL LOW (ref >59)
Glucose: 102 mg/dL — ABNORMAL HIGH (ref 65–99)
Potassium: 4.7 mmol/L (ref 3.5–5.2)
Sodium: 142 mmol/L (ref 134–144)

## 2020-01-01 NOTE — Telephone Encounter (Signed)
velcade approved by [employer]. AUTH #[id number]

## 2020-01-04 ENCOUNTER — Other Ambulatory Visit: Payer: Self-pay

## 2020-01-04 ENCOUNTER — Telehealth: Payer: Self-pay | Admitting: *Deleted

## 2020-01-04 ENCOUNTER — Ambulatory Visit: Payer: BC Managed Care – PPO | Admitting: Family Medicine

## 2020-01-04 ENCOUNTER — Encounter (HOSPITAL_COMMUNITY): Payer: Self-pay | Admitting: Speech Pathology

## 2020-01-04 ENCOUNTER — Other Ambulatory Visit: Payer: Self-pay | Admitting: *Deleted

## 2020-01-04 ENCOUNTER — Ambulatory Visit (HOSPITAL_COMMUNITY): Payer: BC Managed Care – PPO | Attending: Internal Medicine | Admitting: Speech Pathology

## 2020-01-04 ENCOUNTER — Telehealth (HOSPITAL_COMMUNITY): Payer: Self-pay | Admitting: Hematology

## 2020-01-04 DIAGNOSIS — R4701 Aphasia: Secondary | ICD-10-CM

## 2020-01-04 DIAGNOSIS — R41841 Cognitive communication deficit: Secondary | ICD-10-CM | POA: Diagnosis present

## 2020-01-04 MED ORDER — CEPHALEXIN 500 MG PO CAPS
500.0000 mg | ORAL_CAPSULE | Freq: Four times a day (QID) | ORAL | 0 refills | Status: DC
Start: 2020-01-04 — End: 2020-01-19

## 2020-01-04 MED ORDER — CEPHALEXIN 500 MG PO CAPS
ORAL_CAPSULE | ORAL | 0 refills | Status: DC
Start: 2020-01-04 — End: 2020-01-19

## 2020-01-04 NOTE — Telephone Encounter (Signed)
I would recommend Keflex 500 mg, 1 pill taken 4 times daily, #28, if not seeing significant improvement over the next 48 hours or if fevers or if worse recommend to be seen

## 2020-01-04 NOTE — Therapy (Signed)
[place], [place], [address] Phone: [phone]   Fax:  [phone]  Speech Language Pathology Treatment  Patient Details  Name: Joanna Reid MRN: [id number] Date of Birth: [birth year] Referring Provider (SLP): Lala Lund, MD   Encounter Date: 01/04/2020   End of Session - 01/04/20 0945    Visit Number 2    Number of Visits 16    Authorization Type [employer] Comm PPO   $30 copay, 60 combined visit limit, OOP max $5350   SLP Start Time 0907    SLP Stop Time  0956    SLP Time Calculation (min) 49 min    Activity Tolerance Patient tolerated treatment well           Past Medical History:  Diagnosis Date  . Acid reflux   . Allergic rhinitis   . Cancer ([place])    multiple myeloma  . Diabetes mellitus    type 2  . Gout   . Gout   . HBP (high blood pressure)   . History of kidney stones   . Migraines     Past Surgical History:  Procedure Laterality Date  . BREAST CYST EXCISION Left    2009 no visible scar on skin  . CESAREAN SECTION    . COLONOSCOPY WITH PROPOFOL N/A 12/25/2019   Procedure: COLONOSCOPY WITH PROPOFOL;  Surgeon: Rogene Houston, MD;  [facility];  Service: Endoscopy;  Laterality: N/A;  730  . EXTRACORPOREAL SHOCK WAVE LITHOTRIPSY Left 10/10/2017   Procedure: LEFT EXTRACORPOREAL SHOCK WAVE LITHOTRIPSY (ESWL);  Surgeon: Bjorn Loser, MD;  Location: [facility];  Service: Urology;  Laterality: Left;  . EYE SURGERY    . HEMORRHOID SURGERY N/A 11/19/2012   Procedure: HEMORRHOIDECTOMY;  Surgeon: Jamesetta So, MD;  Location: [facility];  Service: General;  Laterality: N/A;  . kidney stones  1998  . LAPAROSCOPIC UNILATERAL SALPINGO OOPHERECTOMY  05/14/2012   Procedure: LAPAROSCOPIC UNILATERAL SALPINGO OOPHORECTOMY;  Surgeon: [name], MD;  Location: [facility];  Service: Gynecology;  Laterality: Right;  laparoscopic right salpingo-oophorectomy  . PARTIAL HYSTERECTOMY    . POLYPECTOMY   12/25/2019   Procedure: POLYPECTOMY;  Surgeon: Rogene Houston, MD;  [facility];  Service: Endoscopy;;  . TONSILECTOMY, ADENOIDECTOMY, BILATERAL MYRINGOTOMY AND TUBES    . VESICOVAGINAL FISTULA CLOSURE W/ TAH      There were no vitals filed for this visit.   Subjective Assessment - 01/04/20 0920    Subjective "Oh, there in the mall"    Patient is accompained by: Family member    Currently in Pain? No/denies            ADULT SLP TREATMENT - 01/04/20 0932      General Information   Behavior/Cognition Alert;Cooperative;Pleasant mood    Patient Positioning Upright in chair    Oral care provided N/A    HPI Pt admitted on 12/26/19 for stroke. TPA note given, outside of time window. MRI 12/27/19 acute ischemic nonhemorrhagic left MCA territory infarct involving the left parietal lobe, few scattered punctate acute ischemic nonhemorrhagic infarcts bilateral frontal and parietal lobes as wellas the left cerebellum, small remote right cerebellar infarcts. PMH Essential hypertension,  obesity, Multiple myeloma ([place]), DM, Anemia, COVID. Pt is referred for outpatient SLP evaluation and treatment for aphasia from acute CVA.      Treatment Provided   Treatment provided Cognitive-Linquistic      Pain Assessment   Pain Assessment No/denies pain  Cognitive-Linquistic Treatment   Treatment focused on Aphasia;Patient/family/caregiver education    Skilled Treatment divergent naming, responsive naming, implementing word finding strategies for dysnomia, building communication launchpad/support      Assessment / Recommendations / [place] with current plan of care      Progression Toward Goals   Progression toward goals Progressing toward goals            SLP Education - 01/04/20 0944    Education Details Add on to communication support system    Person(s) Educated Patient;Spouse    Methods Explanation    Comprehension Verbalized understanding            SLP  Short Term Goals - 01/04/20 0959      SLP SHORT TERM GOAL #1   Title Pt will implement word-finding strategies with 80% accuracy when unable to verbalize desired word in conversation/functional tasks with mod assist.    Baseline ~60%    Time 4    Period Weeks    Status On-going    Target Date 01/29/20      SLP SHORT TERM GOAL #2   Title Pt will describe objects and pictures by providing at least three salient features as judged by clinician with 90% acc when provided min cues.    Baseline 66%    Time 4    Period Weeks    Status On-going    Target Date 01/29/20      SLP SHORT TERM GOAL #3   Title Pt will increase divergent naming to 7+ items per concrete category with min cues.    Baseline 5    Time 4    Period Weeks    Status On-going    Target Date 01/29/20      SLP SHORT TERM GOAL #4   Title Pt will complete basic sentence level reading comprehension tasks with 80% acc and min cues for error awareness.    Baseline 65% sentence level with multiple choice response    Time 4    Period Weeks    Status On-going    Target Date 01/29/20      SLP SHORT TERM GOAL #5   Title Pt will be able to write personal/bio information with 90% acc when provided mod cues    Baseline Able to copy name only    Time 4    Period Weeks    Status On-going    Target Date 01/29/20      SLP SHORT TERM GOAL #6   Title Pt will respond to open-ended questions using a complete sentence with 80% acc and min cues.    Baseline telegraphic responses    Time 4    Period Weeks    Status On-going    Target Date 01/29/20            SLP Long Term Goals - 01/04/20 1000      SLP LONG TERM GOAL #1   Title Pt will communicate moderately complex thoughts and feelings to [facility] with use of strategies as needed.    Baseline mod assist    Time 8    Period Weeks    Status On-going            Plan - 01/04/20 0959    Clinical Impression Statement Pt accompanied to therapy by her husband. She completed  the family and friend portion of her communication profile sent home with her last week. She reports feeling frustrated that she "forgets" information. Today,  we built onto the communication profile and Pt able to complete divergent naming tasks with 6-8 items in specific categories (places, stores, restaurants) with min cues. She needed mi/mod cues to implement word finding strategies during divergent naming tasks and conversation. HEP assigned (cross out the word that doesn't belong and divergent naming with cues). Continue plan of care.    Speech Therapy Frequency 2x / week    Duration Other (comment)   8 weeks   Treatment/Interventions SLP instruction and feedback;Internal/external aids;Compensatory strategies;Compensatory techniques;Patient/family education;Language facilitation;Cueing hierarchy;Multimodal communcation approach    Potential to Achieve Goals Good    SLP Home Exercise Plan Pt will complete HEP as assigned to facilitate carryover of treatment strategies and techniques in home environment with assist from spouse as needed.    Consulted and Agree with Plan of Care Patient;Family member/caregiver    Family Member Consulted Husband, [name]           Patient will benefit from skilled therapeutic intervention in order to improve the following deficits and impairments:   Aphasia  Cognitive communication deficit    Problem List Patient Active Problem List   Diagnosis Date Noted  . 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 H/o COVID-19--- was Positive 09/17/2019, Negative 12/23/19 AND also Neg on  12/26/19 12/26/2019  . Acute bronchiolitis due to respiratory syncytial virus (RSV) 08/11/2018  . Hypomagnesemia 08/10/2018  . Hyperlipidemia 08/09/2018  . Anemia 08/09/2018  . Leukopenia 08/09/2018  . Hypokalemia 08/09/2018  . Hypocalcemia 08/09/2018  . Lactic acidosis 08/09/2018  . Sepsis ([place]) 04/25/2018  . Goals of care,  counseling/discussion 11/01/2017  . Multiple myeloma ([place]) 10/29/2017  . Hypercalcemia 10/29/2017  . Type 2 diabetes mellitus without complication, without long-term current use of insulin ([place]) 01/27/2016  . Morbid obesity due to excess calories ([place]) 01/27/2016  . Anxiety as acute reaction to exceptional stress 01/13/2015  . Attention deficit hyperactivity disorder (ADHD), 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  . Gout 01/20/2014  . GERD (gastroesophageal reflux disease) 01/20/2014  . Essential hypertension, benign 01/20/2014  . Plantar fasciitis of left foot 09/19/2011   Thank you,  Genene Churn, [place]  [facility] 01/04/2020, 10:03 AM  [place] [address] [place], [place], [address] Phone: [phone]   Fax:  [phone]   Name: Joanna Reid MRN: [id number] Date of Birth: 1966-01-20

## 2020-01-04 NOTE — Addendum Note (Signed)
Addended by: Vicente Males on: 01/04/2020 10:46 AM   Modules accepted: Orders

## 2020-01-04 NOTE — Telephone Encounter (Signed)
Medication sent to [facility]. Left message for daughter to return call

## 2020-01-04 NOTE — Telephone Encounter (Signed)
Patient daughter called. Patient was seen last week for Hospital f/u for stroke. Daughter states she was told while in the hospital she had a uti but unsure if given abx for it. Patient symptoms are starting to return now - foul odor and left sided back pain, no fever or burning. Family is hoping for abx or guidance since she was just seen.

## 2020-01-04 NOTE — Telephone Encounter (Signed)
Lm for daughter to return call, abx sent to pharmacy.

## 2020-01-04 NOTE — Telephone Encounter (Signed)
Pc from [facility]) stating the can not fill pts velcade script because it is billed under Medical. I will have to contact pts insurance to see if I can get the correct speciality pharmacy(this time) to submit to.

## 2020-01-05 ENCOUNTER — Ambulatory Visit (HOSPITAL_COMMUNITY): Payer: BC Managed Care – PPO

## 2020-01-05 ENCOUNTER — Encounter (HOSPITAL_COMMUNITY): Payer: Self-pay | Admitting: *Deleted

## 2020-01-05 ENCOUNTER — Other Ambulatory Visit (HOSPITAL_COMMUNITY): Payer: BC Managed Care – PPO

## 2020-01-05 NOTE — Telephone Encounter (Signed)
Patient daughter notified

## 2020-01-05 NOTE — Progress Notes (Addendum)
Patient's daughter called wanting to know when her mom would need to come in to restart her treatments.  She had her stroke on 6/19 and was discharged to home on 6/21.  She is having some speech difficulties but otherwise doing well.    I spoke with provider and he wants to see patient in 2 weeks for labs and visit and they will discuss treatment at that time.

## 2020-01-06 ENCOUNTER — Ambulatory Visit (HOSPITAL_COMMUNITY): Payer: BC Managed Care – PPO | Admitting: Speech Pathology

## 2020-01-06 ENCOUNTER — Encounter (HOSPITAL_COMMUNITY): Payer: Self-pay | Admitting: Speech Pathology

## 2020-01-06 ENCOUNTER — Other Ambulatory Visit: Payer: Self-pay

## 2020-01-06 ENCOUNTER — Encounter: Payer: Self-pay | Admitting: *Deleted

## 2020-01-06 DIAGNOSIS — R4701 Aphasia: Secondary | ICD-10-CM | POA: Diagnosis not present

## 2020-01-06 DIAGNOSIS — R41841 Cognitive communication deficit: Secondary | ICD-10-CM

## 2020-01-06 NOTE — Therapy (Signed)
[place], [place], [address] Phone: [phone]   Fax:  [phone]  Speech Language Pathology Treatment  Patient Details  Name: Joanna Reid MRN: [id number] Date of Birth: Sep 25, 1965 Referring Provider (SLP): Lala Lund, MD   Encounter Date: 01/06/2020   End of Session - 01/06/20 1058    Visit Number 3    Number of Visits 16    Authorization Type [employer] Comm PPO   $30 copay, 60 combined visit limit, OOP max $5350   SLP Start Time 2481    SLP Stop Time  1120    SLP Time Calculation (min) 45 min    Activity Tolerance Patient tolerated treatment well           Past Medical History:  Diagnosis Date  . Acid reflux   . Allergic rhinitis   . Cancer ([place])    multiple myeloma  . Diabetes mellitus    type 2  . Gout   . Gout   . HBP (high blood pressure)   . History of kidney stones   . Migraines     Past Surgical History:  Procedure Laterality Date  . BREAST CYST EXCISION Left    2009 no visible scar on skin  . CESAREAN SECTION    . COLONOSCOPY WITH PROPOFOL N/A 12/25/2019   Procedure: COLONOSCOPY WITH PROPOFOL;  Surgeon: Rogene Houston, MD;  [facility];  Service: Endoscopy;  Laterality: N/A;  730  . EXTRACORPOREAL SHOCK WAVE LITHOTRIPSY Left 10/10/2017   Procedure: LEFT EXTRACORPOREAL SHOCK WAVE LITHOTRIPSY (ESWL);  Surgeon: Bjorn Loser, MD;  [facility];  Service: Urology;  Laterality: Left;  . EYE SURGERY    . HEMORRHOID SURGERY N/A 11/19/2012   Procedure: HEMORRHOIDECTOMY;  Surgeon: Jamesetta So, MD;  Location: [facility];  Service: General;  Laterality: N/A;  . kidney stones  1998  . LAPAROSCOPIC UNILATERAL SALPINGO OOPHERECTOMY  05/14/2012   Procedure: LAPAROSCOPIC UNILATERAL SALPINGO OOPHORECTOMY;  Surgeon: [name], MD;  Location: [facility];  Service: Gynecology;  Laterality: Right;  laparoscopic right salpingo-oophorectomy  . PARTIAL HYSTERECTOMY    . POLYPECTOMY   12/25/2019   Procedure: POLYPECTOMY;  Surgeon: Rogene Houston, MD;  [facility];  Service: Endoscopy;;  . TONSILECTOMY, ADENOIDECTOMY, BILATERAL MYRINGOTOMY AND TUBES    . VESICOVAGINAL FISTULA CLOSURE W/ TAH      There were no vitals filed for this visit.   Subjective Assessment - 01/06/20 1050    Subjective "Valentine[employer] is my favorite..."    Patient is accompained by: Family member    Currently in Pain? No/denies               ADULT SLP TREATMENT - 01/06/20 0001      General Information   Behavior/Cognition Alert;Cooperative;Pleasant mood    Patient Positioning Upright in chair    Oral care provided N/A    HPI Pt admitted on 12/26/19 for stroke. TPA note given, outside of time window. MRI 12/27/19 acute ischemic nonhemorrhagic left MCA territory infarct involving the left parietal lobe, few scattered punctate acute ischemic nonhemorrhagic infarcts bilateral frontal and parietal lobes as wellas the left cerebellum, small remote right cerebellar infarcts. PMH Essential hypertension,  obesity, Multiple myeloma ([place]), DM, Anemia, COVID. Pt is referred for outpatient SLP evaluation and treatment for aphasia from acute CVA.      Treatment Provided   Treatment provided Cognitive-Linquistic      Pain Assessment   Pain Assessment  No/denies pain      Cognitive-Linquistic Treatment   Treatment focused on Aphasia;Patient/family/caregiver education    Skilled Treatment divergent and convergent naming, responsive naming, implementing word finding strategies for dysnomia, building communication launchpad/support      Assessment / Recommendations / Plan   Plan Continue with current plan of care      Progression Toward Goals   Progression toward goals Progressing toward goals              SLP Short Term Goals - 01/06/20 1059      SLP SHORT TERM GOAL #1   Title Pt will implement word-finding strategies with 80% accuracy when unable to verbalize desired word in  conversation/functional tasks with mod assist.    Baseline ~60%    Time 4    Period Weeks    Status On-going    Target Date 01/29/20      SLP SHORT TERM GOAL #2   Title Pt will describe objects and pictures by providing at least three salient features as judged by clinician with 90% acc when provided min cues.    Baseline 66%    Time 4    Period Weeks    Status On-going    Target Date 01/29/20      SLP SHORT TERM GOAL #3   Title Pt will increase divergent naming to 7+ items per concrete category with min cues.    Baseline 5    Time 4    Period Weeks    Status On-going    Target Date 01/29/20      SLP SHORT TERM GOAL #4   Title Pt will complete basic sentence level reading comprehension tasks with 80% acc and min cues for error awareness.    Baseline 65% sentence level with multiple choice response    Time 4    Period Weeks    Status On-going    Target Date 01/29/20      SLP SHORT TERM GOAL #5   Title Pt will be able to write personal/bio information with 90% acc when provided mod cues    Baseline Able to copy name only    Time 4    Period Weeks    Status On-going    Target Date 01/29/20      SLP SHORT TERM GOAL #6   Title Pt will respond to open-ended questions using a complete sentence with 80% acc and min cues.    Baseline telegraphic responses    Time 4    Period Weeks    Status On-going    Target Date 01/29/20            SLP Long Term Goals - 01/06/20 1113      SLP LONG TERM GOAL #1   Title Pt will communicate moderately complex thoughts and feelings to [facility] with use of strategies as needed.    Baseline mod assist    Time 8    Period Weeks    Status On-going            Plan - 01/06/20 1059    Clinical Impression Statement Pt accompanied to therapy by her husband and granddaughter. She completed HEP with 95% acc. Pt completed convergent naming activity by naming category with 90% acc and adding to the category with 100% acc with min assist. SLP  introduced describing objects by salient features and Pt required mod cues to implement. She was cued to try self cues via self generated orthographic cues (finger spelling on  the table), which we will continue to work on. Paraphasic errors continue to frustrate Pt. Additional HEP provided.  Continue plan of care.    Speech Therapy Frequency 2x / week    Duration Other (comment)   8 weeks   Treatment/Interventions SLP instruction and feedback;Internal/external aids;Compensatory strategies;Compensatory techniques;Patient/family education;Language facilitation;Cueing hierarchy;Multimodal communcation approach    Potential to Achieve Goals Good    SLP Home Exercise Plan Pt will complete HEP as assigned to facilitate carryover of treatment strategies and techniques in home environment with assist from spouse as needed.    Consulted and Agree with Plan of Care Patient;Family member/caregiver    Family Member Consulted Husband, [name]           Patient will benefit from skilled therapeutic intervention in order to improve the following deficits and impairments:   Aphasia  Cognitive communication deficit    Problem List Patient Active Problem List   Diagnosis Date Noted  . 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 H/o COVID-19--- was Positive 09/17/2019, Negative 12/23/19 AND also Neg on  12/26/19 12/26/2019  . Acute bronchiolitis due to respiratory syncytial virus (RSV) 08/11/2018  . Hypomagnesemia 08/10/2018  . Hyperlipidemia 08/09/2018  . Anemia 08/09/2018  . Leukopenia 08/09/2018  . Hypokalemia 08/09/2018  . Hypocalcemia 08/09/2018  . Lactic acidosis 08/09/2018  . Sepsis ([place]) 04/25/2018  . Goals of care, counseling/discussion 11/01/2017  . Multiple myeloma ([place]) 10/29/2017  . Hypercalcemia 10/29/2017  . Type 2 diabetes mellitus without complication, without long-term current use of insulin ([place]) 01/27/2016  . Morbid obesity due  to excess calories ([place]) 01/27/2016  . Anxiety as acute reaction to exceptional stress 01/13/2015  . Attention deficit hyperactivity disorder (ADHD), 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  . Gout 01/20/2014  . GERD (gastroesophageal reflux disease) 01/20/2014  . Essential hypertension, benign 01/20/2014  . Plantar fasciitis of left foot 09/19/2011   Thank you,  Genene Churn, [place]  [facility] 01/06/2020, 11:44 AM  [place] [address] [place], [place], [address] Phone: [phone]   Fax:  [phone]   Name: Joanna Reid MRN: [id number] Date of Birth: 05-Mar-1966

## 2020-01-06 NOTE — Telephone Encounter (Signed)
Sent mychart message

## 2020-01-08 ENCOUNTER — Other Ambulatory Visit: Payer: Self-pay

## 2020-01-08 ENCOUNTER — Ambulatory Visit (HOSPITAL_COMMUNITY): Payer: BC Managed Care – PPO | Attending: Internal Medicine | Admitting: Occupational Therapy

## 2020-01-08 ENCOUNTER — Encounter (HOSPITAL_COMMUNITY): Payer: Self-pay | Admitting: Occupational Therapy

## 2020-01-08 DIAGNOSIS — R29898 Other symptoms and signs involving the musculoskeletal system: Secondary | ICD-10-CM

## 2020-01-08 DIAGNOSIS — R41841 Cognitive communication deficit: Secondary | ICD-10-CM | POA: Diagnosis present

## 2020-01-08 DIAGNOSIS — R278 Other lack of coordination: Secondary | ICD-10-CM | POA: Diagnosis not present

## 2020-01-08 DIAGNOSIS — R4701 Aphasia: Secondary | ICD-10-CM | POA: Diagnosis present

## 2020-01-08 NOTE — Patient Instructions (Signed)
  Handwriting Practice:   1) Pick 5 words per day and practice writing each word 10 times   2) Find an adult coloring book and color 1 picture per day  3) Practice cutting  4) PAINT PAINT PAINT!

## 2020-01-08 NOTE — Therapy (Signed)
[place], [place], [address] Phone: [phone]   Fax:  [phone]  Occupational Therapy Evaluation  Patient Details  Name: Joanna Reid MRN: [id number] Date of Birth: May 07, 1966 Referring Provider (OT): Dr. Lala Lund   Encounter Date: 01/08/2020   OT End of Session - 01/08/20 1706    Visit Number 1    Number of Visits 3    Date for OT Re-Evaluation 02/05/20    Authorization Type [employer] PPO; $30 copay    Authorization Time Period 60 visit limit combined    Authorization - Visit Number 4    Authorization - Number of Visits 65    OT Start Time 4801    OT Stop Time 1555    OT Time Calculation (min) 37 min    Activity Tolerance Patient tolerated treatment well    Behavior During Therapy [facility] for tasks assessed/performed           Past Medical History:  Diagnosis Date  . Acid reflux   . Allergic rhinitis   . Cancer ([place])    multiple myeloma  . Diabetes mellitus    type 2  . Gout   . Gout   . HBP (high blood pressure)   . History of kidney stones   . Migraines     Past Surgical History:  Procedure Laterality Date  . BREAST CYST EXCISION Left    2009 no visible scar on skin  . CESAREAN SECTION    . COLONOSCOPY WITH PROPOFOL N/A 12/25/2019   Procedure: COLONOSCOPY WITH PROPOFOL;  Surgeon: Rogene Houston, MD;  [facility];  Service: Endoscopy;  Laterality: N/A;  730  . EXTRACORPOREAL SHOCK WAVE LITHOTRIPSY Left 10/10/2017   Procedure: LEFT EXTRACORPOREAL SHOCK WAVE LITHOTRIPSY (ESWL);  Surgeon: Bjorn Loser, MD;  Location: [facility];  Service: Urology;  Laterality: Left;  . EYE SURGERY    . HEMORRHOID SURGERY N/A 11/19/2012   Procedure: HEMORRHOIDECTOMY;  Surgeon: Jamesetta So, MD;  Location: [facility];  Service: General;  Laterality: N/A;  . kidney stones  1998  . LAPAROSCOPIC UNILATERAL SALPINGO OOPHERECTOMY  05/14/2012   Procedure: LAPAROSCOPIC UNILATERAL SALPINGO  OOPHORECTOMY;  Surgeon: [name], MD;  Location: [facility];  Service: Gynecology;  Laterality: Right;  laparoscopic right salpingo-oophorectomy  . PARTIAL HYSTERECTOMY    . POLYPECTOMY  12/25/2019   Procedure: POLYPECTOMY;  Surgeon: Rogene Houston, MD;  [facility];  Service: Endoscopy;;  . TONSILECTOMY, ADENOIDECTOMY, BILATERAL MYRINGOTOMY AND TUBES    . VESICOVAGINAL FISTULA CLOSURE W/ TAH      There were no vitals filed for this visit.   Subjective Assessment - 01/08/20 1702    Subjective  S: I can do a lot of things myself.    Pertinent History Pt is a 54 y/o female s/p left CVA on 12/26/2019 with primary deficits being speech and language and fine motor coordination. Pt presents with daughter for evaluation, was referred to occupational therapy for evaluation and treatment by Dr. Lala Lund.    Patient Stated Goals To be able to write and text.    Currently in Pain? No/denies             [facility] OT Assessment - 01/08/20 1518      Assessment   Medical Diagnosis s/p left CVA    Referring Provider (OT) Dr. Lala Lund    Onset Date/Surgical Date 12/26/19    Hand Dominance Right  Next MD Visit end of the month    Prior Therapy acute OT eval      Precautions   Precautions None      Restrictions   Weight Bearing Restrictions No      Balance Screen   Has the patient fallen in the past 6 months No    Has the patient had a decrease in activity level because of a fear of falling?  No    Is the patient reluctant to leave their home because of a fear of falling?  No      Prior Function   Level of Independence Independent    Vocation On disability    Leisure painting, crafting, decorating      ADL   ADL comments Pt is having difficulty with fine motor control tasks-handwriting, texting      Written Expression   Dominant Hand Right    Handwriting 90% legible;Increased time      Cognition   Overall Cognitive Status Within Functional Limits for tasks  assessed      Sensation   Light Touch Appears Intact      Coordination   Gross Motor Movements are Fluid and Coordinated Yes    Fine Motor Movements are Fluid and Coordinated Yes    9 Hole Peg Test Right;Left    Right 9 Hole Peg Test 26.48"    Left 9 Hole Peg Test 23.6"      ROM / Strength   AROM / PROM / Strength Strength      Strength   Strength Assessment Site Hand    Right/Left hand Right;Left    Right Hand Gross Grasp Functional    Right Hand Grip (lbs) 75    Right Hand Lateral Pinch 16 lbs    Right Hand 3 Point Pinch 16 lbs    Left Hand Gross Grasp Functional    Left Hand Grip (lbs) 75    Left Hand Lateral Pinch 16 lbs    Left Hand 3 Point Pinch 16 lbs                           OT Education - 01/08/20 1705    Education Details handwriting practice, pencil control, adult coloring book practice    Person(s) Educated Patient;Child(ren)    Methods Explanation;Handout    Comprehension Verbalized understanding;Returned demonstration            OT Short Term Goals - 01/08/20 1714      OT SHORT TERM GOAL #1   Title Pt will be provided with and educated on HEP to improve fine motor coordination required for texting and handwriting.    Time 4    Period Weeks    Status New    Target Date 02/07/20      OT SHORT TERM GOAL #2   Title Pt will demonstrate improved fine motor coordination in right hand by completing 9 hole peg test in under 23"    Time 4    Period Weeks    Status New      OT SHORT TERM GOAL #3   Title Pt will demonstrate improved handwriting by writing a 5 word sentence using smooth strokes and acknowledging lines 4/5 trials.    Time 4    Period Weeks    Status New                    Plan - 01/08/20 1707  Clinical Impression Statement A: Pt is a 54 y/o female s/p left CVA on 12/26/19 presenting for evaluation with reports of fine motor coordination deficits limiting sucess with handwriting and communication means such as  texting. Pt reports improvement since discharge from the hospital, however does endorse continued coordination deficits with isolated fine motor movements.    OT Occupational Profile and History Problem Focused Assessment - Including review of records relating to presenting problem    Occupational performance deficits (Please refer to evaluation for details): IADL's;Leisure    Body Structure / Function / Physical Skills ADL;UE functional use;FMC;Coordination;IADL    Rehab Potential Good    Clinical Decision Making Limited treatment options, no task modification necessary    Comorbidities Affecting Occupational Performance: None    Modification or Assistance to Complete Evaluation  No modification of tasks or assist necessary to complete eval    OT Frequency Biweekly    OT Duration 4 weeks    OT Treatment/Interventions Self-care/ADL training;Patient/family education;Therapeutic exercise;Therapeutic activities    Plan P: Pt will benefit from skilled OT services to improve fine motor coordination required for ADLs, I/ADLs, handwriting, and leisure tasks. Treatment plan: fine motor coordination tasks, handwriting practice, HEPs    OT Home Exercise Plan eval: pencil control packet, home handwriting activities    Consulted and Agree with Plan of Care Patient;Family member/caregiver    Family Member Consulted daughter           Patient will benefit from skilled therapeutic intervention in order to improve the following deficits and impairments:   Body Structure / Function / Physical Skills: ADL, UE functional use, [place], Coordination, IADL       Visit Diagnosis: Other lack of coordination  Other symptoms and signs involving the musculoskeletal system    Problem List Patient Active Problem List   Diagnosis Date Noted  . 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 H/o COVID-19--- was Positive 09/17/2019, Negative 12/23/19 AND also Neg on   12/26/19 12/26/2019  . Acute bronchiolitis due to respiratory syncytial virus (RSV) 08/11/2018  . Hypomagnesemia 08/10/2018  . Hyperlipidemia 08/09/2018  . Anemia 08/09/2018  . Leukopenia 08/09/2018  . Hypokalemia 08/09/2018  . Hypocalcemia 08/09/2018  . Lactic acidosis 08/09/2018  . Sepsis ([place]) 04/25/2018  . Goals of care, counseling/discussion 11/01/2017  . Multiple myeloma ([place]) 10/29/2017  . Hypercalcemia 10/29/2017  . Type 2 diabetes mellitus without complication, without long-term current use of insulin ([place]) 01/27/2016  . Morbid obesity due to excess calories ([place]) 01/27/2016  . Anxiety as acute reaction to exceptional stress 01/13/2015  . Attention deficit hyperactivity disorder (ADHD), 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  . Gout 01/20/2014  . GERD (gastroesophageal reflux disease) 01/20/2014  . Essential hypertension, benign 01/20/2014  . Plantar fasciitis of left foot 09/19/2011   Guadelupe Sabin, OTR/L  [phone] 01/08/2020, 5:17 PM  [place], [place], [address] Phone: [phone]   Fax:  [phone]  Name: Joanna Reid MRN: [id number] Date of Birth: 1965/07/29

## 2020-01-12 ENCOUNTER — Ambulatory Visit (HOSPITAL_COMMUNITY): Payer: BC Managed Care – PPO

## 2020-01-12 ENCOUNTER — Other Ambulatory Visit (HOSPITAL_COMMUNITY): Payer: Self-pay | Admitting: Hematology

## 2020-01-12 ENCOUNTER — Encounter (HOSPITAL_COMMUNITY): Payer: Self-pay | Admitting: Speech Pathology

## 2020-01-12 ENCOUNTER — Other Ambulatory Visit (HOSPITAL_COMMUNITY): Payer: BC Managed Care – PPO

## 2020-01-12 ENCOUNTER — Ambulatory Visit (HOSPITAL_COMMUNITY): Payer: BC Managed Care – PPO | Admitting: Speech Pathology

## 2020-01-12 ENCOUNTER — Other Ambulatory Visit: Payer: Self-pay

## 2020-01-12 DIAGNOSIS — R4701 Aphasia: Secondary | ICD-10-CM

## 2020-01-12 DIAGNOSIS — R278 Other lack of coordination: Secondary | ICD-10-CM | POA: Diagnosis not present

## 2020-01-12 DIAGNOSIS — R41841 Cognitive communication deficit: Secondary | ICD-10-CM

## 2020-01-12 NOTE — Therapy (Signed)
[place], [place], [address] Phone: [phone]   Fax:  [phone]  Speech Language Pathology Treatment  Patient Details  Name: Joanna Reid MRN: [id number] Date of Birth: 24-Mar-1966 Referring Provider (SLP): Lala Lund, MD   Encounter Date: 01/12/2020   End of Session - 01/12/20 1607    Visit Number 4    Number of Visits 16    Authorization Type [employer] Comm PPO   $30 copay, 60 combined visit limit, OOP max $[id number]   SLP Start Time 1602    SLP Stop Time  9390    SLP Time Calculation (min) 43 min    Activity Tolerance Patient tolerated treatment well           Past Medical History:  Diagnosis Date  . Acid reflux   . Allergic rhinitis   . Cancer ([place])    multiple myeloma  . Diabetes mellitus    type 2  . Gout   . Gout   . HBP (high blood pressure)   . History of kidney stones   . Migraines     Past Surgical History:  Procedure Laterality Date  . BREAST CYST EXCISION Left    2009 no visible scar on skin  . CESAREAN SECTION    . COLONOSCOPY WITH PROPOFOL N/A 12/25/2019   Procedure: COLONOSCOPY WITH PROPOFOL;  Surgeon: Rogene Houston, MD;  [facility];  Service: Endoscopy;  Laterality: N/A;  730  . EXTRACORPOREAL SHOCK WAVE LITHOTRIPSY Left 10/10/2017   Procedure: LEFT EXTRACORPOREAL SHOCK WAVE LITHOTRIPSY (ESWL);  Surgeon: Bjorn Loser, MD;  Location: [facility];  Service: Urology;  Laterality: Left;  . EYE SURGERY    . HEMORRHOID SURGERY N/A 11/19/2012   Procedure: HEMORRHOIDECTOMY;  Surgeon: Jamesetta So, MD;  Location: [facility];  Service: General;  Laterality: N/A;  . kidney stones  1998  . LAPAROSCOPIC UNILATERAL SALPINGO OOPHERECTOMY  05/14/2012   Procedure: LAPAROSCOPIC UNILATERAL SALPINGO OOPHORECTOMY;  Surgeon: [name], MD;  Location: [facility];  Service: Gynecology;  Laterality: Right;  laparoscopic right salpingo-oophorectomy  . PARTIAL HYSTERECTOMY    . POLYPECTOMY   12/25/2019   Procedure: POLYPECTOMY;  Surgeon: Rogene Houston, MD;  [facility];  Service: Endoscopy;;  . TONSILECTOMY, ADENOIDECTOMY, BILATERAL MYRINGOTOMY AND TUBES    . VESICOVAGINAL FISTULA CLOSURE W/ TAH      There were no vitals filed for this visit.   Subjective Assessment - 01/12/20 1606    Subjective "I have tried doing texting."    Currently in Pain? No/denies             ADULT SLP TREATMENT - 01/12/20 1606      General Information   Behavior/Cognition Alert;Cooperative;Pleasant mood    Patient Positioning Upright in chair    Oral care provided N/A    HPI Pt admitted on 12/26/19 for stroke. TPA note given, outside of time window. MRI 12/27/19 acute ischemic nonhemorrhagic left MCA territory infarct involving the left parietal lobe, few scattered punctate acute ischemic nonhemorrhagic infarcts bilateral frontal and parietal lobes as wellas the left cerebellum, small remote right cerebellar infarcts. PMH Essential hypertension,  obesity, Multiple myeloma ([place]), DM, Anemia, COVID. Pt is referred for outpatient SLP evaluation and treatment for aphasia from acute CVA.      Treatment Provided   Treatment provided Cognitive-Linquistic      Pain Assessment   Pain Assessment No/denies pain      Cognitive-Linquistic Treatment  Treatment focused on Aphasia;Patient/family/caregiver education    Skilled Treatment divergent naming, responsive naming, implementing word finding strategies for dysnomia, reading comprehension and oral reading, and building communication launchpad/support      Assessment / Recommendations / [place] with current plan of care      Progression Toward Goals   Progression toward goals Progressing toward goals              SLP Short Term Goals - 01/12/20 1608      SLP SHORT TERM GOAL #1   Title Pt will implement word-finding strategies with 80% accuracy when unable to verbalize desired word in conversation/functional tasks  with mod assist.    Baseline ~60%    Time 4    Period Weeks    Status On-going    Target Date 01/29/20      SLP SHORT TERM GOAL #2   Title Pt will describe objects and pictures by providing at least three salient features as judged by clinician with 90% acc when provided min cues.    Baseline 66%    Time 4    Period Weeks    Status On-going    Target Date 01/29/20      SLP SHORT TERM GOAL #3   Title Pt will increase divergent naming to 7+ items per concrete category with min cues.    Baseline 5    Time 4    Period Weeks    Status On-going    Target Date 01/29/20      SLP SHORT TERM GOAL #4   Title Pt will complete basic sentence level reading comprehension tasks with 80% acc and min cues for error awareness.    Baseline 65% sentence level with multiple choice response    Time 4    Period Weeks    Status On-going    Target Date 01/29/20      SLP SHORT TERM GOAL #5   Title Pt will be able to write personal/bio information with 90% acc when provided mod cues    Baseline Able to copy name only    Time 4    Period Weeks    Status On-going    Target Date 01/29/20      SLP SHORT TERM GOAL #6   Title Pt will respond to open-ended questions using a complete sentence with 80% acc and min cues.    Baseline telegraphic responses    Time 4    Period Weeks    Status On-going    Target Date 01/29/20            SLP Long Term Goals - 01/12/20 1608      SLP LONG TERM GOAL #1   Title Pt will communicate moderately complex thoughts and feelings to [facility] with use of strategies as needed.    Baseline mod assist    Time 8    Period Weeks    Status On-going            Plan - 01/12/20 1608    Clinical Impression Statement Pt accompanied to therapy by a friend today. She completed HEP with 95% acc with oral reading and writing assist from a family member (responsive naming). Pt was able to orally read questions with ~75% acc and write responses with 85% acc with cue for error  awareness. She completed divergent naming activity (naming colors, states, animals) with 100% acc for 10+ items per category. 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 She sent a text to a friend today in session with assist to use "voice to text".  Additional HEP provided.  Continue plan of care.    Speech Therapy Frequency 2x / week    Duration Other (comment)   8 weeks   Treatment/Interventions SLP instruction and feedback;Internal/external aids;Compensatory strategies;Compensatory techniques;Patient/family education;Language facilitation;Cueing hierarchy;Multimodal communcation approach    Potential to Achieve Goals Good    SLP Home Exercise Plan Pt will complete HEP as assigned to facilitate carryover of treatment strategies and techniques in home environment with assist from spouse as needed.    Consulted and Agree with Plan of Care Patient;Family member/caregiver    Family Member Consulted Husband, [name]           Patient will benefit from skilled therapeutic intervention in order to improve the following deficits and impairments:   Aphasia  Cognitive communication deficit    Problem List Patient Active Problem List   Diagnosis Date Noted  . 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 H/o COVID-19--- was Positive 09/17/2019, Negative 12/23/19 AND also Neg on  12/26/19 12/26/2019  . Acute bronchiolitis due to respiratory syncytial virus (RSV) 08/11/2018  . Hypomagnesemia 08/10/2018  . Hyperlipidemia 08/09/2018  . Anemia 08/09/2018  . Leukopenia 08/09/2018  . Hypokalemia 08/09/2018  . Hypocalcemia 08/09/2018  . Lactic acidosis 08/09/2018  . Sepsis ([place]) 04/25/2018  . Goals of care, counseling/discussion 11/01/2017  . Multiple myeloma ([place]) 10/29/2017  . Hypercalcemia 10/29/2017  . Type 2 diabetes mellitus without complication, without long-term current use of insulin ([place])  01/27/2016  . Morbid obesity due to excess calories ([place]) 01/27/2016  . Anxiety as acute reaction to exceptional stress 01/13/2015  . Attention deficit hyperactivity disorder (ADHD), 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  . Gout 01/20/2014  . GERD (gastroesophageal reflux disease) 01/20/2014  . Essential hypertension, benign 01/20/2014  . Plantar fasciitis of left foot 09/19/2011   Thank you,  Genene Churn, [place]  [facility] 01/12/2020, 4:13 PM  [place], [place], [address] Phone: [phone]   Fax:  [phone]   Name: Joanna Reid MRN: [id number] Date of Birth: 1966/05/27

## 2020-01-14 ENCOUNTER — Ambulatory Visit (HOSPITAL_COMMUNITY): Payer: BC Managed Care – PPO | Admitting: Speech Pathology

## 2020-01-14 ENCOUNTER — Encounter (HOSPITAL_COMMUNITY): Payer: Self-pay | Admitting: Speech Pathology

## 2020-01-14 ENCOUNTER — Other Ambulatory Visit: Payer: Self-pay

## 2020-01-14 DIAGNOSIS — R278 Other lack of coordination: Secondary | ICD-10-CM | POA: Diagnosis not present

## 2020-01-14 DIAGNOSIS — R41841 Cognitive communication deficit: Secondary | ICD-10-CM

## 2020-01-14 DIAGNOSIS — R4701 Aphasia: Secondary | ICD-10-CM

## 2020-01-14 NOTE — Therapy (Signed)
[place], [place], [address] Phone: [phone]   Fax:  [phone]  Speech Language Pathology Treatment  Patient Details  Name: Joanna Reid MRN: [id number] Date of Birth: Nov 20, 1965 Referring Provider (SLP): Lala Lund, MD   Encounter Date: 01/14/2020   End of Session - 01/14/20 1121    Visit Number 5    Number of Visits 16    Authorization Type [employer] PPO   $30 copay, 60 combined visit limit, OOP max $[id number]   SLP Start Time 1030    SLP Stop Time  1122    SLP Time Calculation (min) 52 min    Activity Tolerance Patient tolerated treatment well           Past Medical History:  Diagnosis Date  . Acid reflux   . Allergic rhinitis   . Cancer ([place])    multiple myeloma  . Diabetes mellitus    type 2  . Gout   . Gout   . HBP (high blood pressure)   . History of kidney stones   . Migraines     Past Surgical History:  Procedure Laterality Date  . BREAST CYST EXCISION Left    2009 no visible scar on skin  . CESAREAN SECTION    . COLONOSCOPY WITH PROPOFOL N/A 12/25/2019   Procedure: COLONOSCOPY WITH PROPOFOL;  Surgeon: Rogene Houston, MD;  [facility];  Service: Endoscopy;  Laterality: N/A;  730  . EXTRACORPOREAL SHOCK WAVE LITHOTRIPSY Left 10/10/2017   Procedure: LEFT EXTRACORPOREAL SHOCK WAVE LITHOTRIPSY (ESWL);  Surgeon: Bjorn Loser, MD;  Location: [facility];  Service: Urology;  Laterality: Left;  . EYE SURGERY    . HEMORRHOID SURGERY N/A 11/19/2012   Procedure: HEMORRHOIDECTOMY;  Surgeon: Jamesetta So, MD;  Location: [facility];  Service: General;  Laterality: N/A;  . kidney stones  1998  . LAPAROSCOPIC UNILATERAL SALPINGO OOPHERECTOMY  05/14/2012   Procedure: LAPAROSCOPIC UNILATERAL SALPINGO OOPHORECTOMY;  Surgeon: [name], MD;  Location: [facility];  Service: Gynecology;  Laterality: Right;  laparoscopic right salpingo-oophorectomy  . PARTIAL HYSTERECTOMY    . POLYPECTOMY   12/25/2019   Procedure: POLYPECTOMY;  Surgeon: Rogene Houston, MD;  [facility];  Service: Endoscopy;;  . TONSILECTOMY, ADENOIDECTOMY, BILATERAL MYRINGOTOMY AND TUBES    . VESICOVAGINAL FISTULA CLOSURE W/ TAH      There were no vitals filed for this visit.   Subjective Assessment - 01/14/20 1112    Subjective "I did my homework."    Patient is accompained by: Family member    Currently in Pain? No/denies                 ADULT SLP TREATMENT - 01/14/20 1114      General Information   Behavior/Cognition Alert;Cooperative;Pleasant mood    Patient Positioning Upright in chair    Oral care provided N/A    HPI Pt admitted on 12/26/19 for stroke. TPA note given, outside of time window. MRI 12/27/19 acute ischemic nonhemorrhagic left MCA territory infarct involving the left parietal lobe, few scattered punctate acute ischemic nonhemorrhagic infarcts bilateral frontal and parietal lobes as wellas the left cerebellum, small remote right cerebellar infarcts. PMH Essential hypertension,  obesity, Multiple myeloma ([place]), DM, Anemia, COVID. Pt is referred for outpatient SLP evaluation and treatment for aphasia from acute CVA.      Treatment Provided   Treatment provided Cognitive-Linquistic      Pain Assessment  Pain Assessment No/denies pain      Cognitive-Linquistic Treatment   Treatment focused on Aphasia;Patient/family/caregiver education    Skilled Treatment Answering Wh-questions, use of communication support page, implementing word finding and oral reading strategies, use of orthographic cues, patient centered approach, reading comprehension, self evaluation, and oral reading tasks     Assessment / Recommendations / [place] with current plan of care      Progression Toward Goals   Progression toward goals Progressing toward goals            SLP Education - 01/14/20 1118    Education Details Reviewed strategies- look away, write, finger spell     Person(s) Educated Patient;Spouse    Methods Explanation;Handout    Comprehension Verbalized understanding            SLP Short Term Goals - 01/14/20 1122      SLP SHORT TERM GOAL #1   Title Pt will implement word-finding strategies with 80% accuracy when unable to verbalize desired word in conversation/functional tasks with mod assist.    Baseline ~60%    Time 4    Period Weeks    Status On-going    Target Date 01/29/20      SLP SHORT TERM GOAL #2   Title Pt will describe objects and pictures by providing at least three salient features as judged by clinician with 90% acc when provided min cues.    Baseline 66%    Time 4    Period Weeks    Status On-going    Target Date 01/29/20      SLP SHORT TERM GOAL #3   Title Pt will increase divergent naming to 7+ items per concrete category with min cues.    Baseline 5    Time 4    Period Weeks    Status On-going    Target Date 01/29/20      SLP SHORT TERM GOAL #4   Title Pt will complete basic sentence level reading comprehension tasks with 80% acc and min cues for error awareness.    Baseline 65% sentence level with multiple choice response    Time 4    Period Weeks    Status On-going    Target Date 01/29/20      SLP SHORT TERM GOAL #5   Title Pt will be able to write personal/bio information with 90% acc when provided mod cues    Baseline Able to copy name only    Time 4    Period Weeks    Status On-going    Target Date 01/29/20      SLP SHORT TERM GOAL #6   Title Pt will respond to open-ended questions using a complete sentence with 80% acc and min cues.    Baseline telegraphic responses    Time 4    Period Weeks    Status On-going    Target Date 01/29/20            SLP Long Term Goals - 01/14/20 1123      SLP LONG TERM GOAL #1   Title Pt will communicate moderately complex thoughts and feelings to [facility] with use of strategies as needed.    Baseline mod assist    Time 8    Period Weeks    Status On-going             Plan - 01/14/20 1122    Clinical Impression Statement Pt accompanied to therapy by her husband today.  She completed HEP with 95% acc for single word sentence completion tasks (errors only for spelling). She benefited from cues to stop and look away and return to oral reading task when she gets stuck. She needed mi/mod cues to implement other strategies previously reviewed (finger spell, writing, and use of automatic speech to trigger accurate responses). She continues to make great improvement. Additional HEP provided.  Continue plan of care.    Speech Therapy Frequency 2x / week    Duration Other (comment)   8 weeks   Treatment/Interventions SLP instruction and feedback;Internal/external aids;Compensatory strategies;Compensatory techniques;Patient/family education;Language facilitation;Cueing hierarchy;Multimodal communcation approach    Potential to Achieve Goals Good    SLP Home Exercise Plan Pt will complete HEP as assigned to facilitate carryover of treatment strategies and techniques in home environment with assist from spouse as needed.    Consulted and Agree with Plan of Care Patient;Family member/caregiver    Family Member Consulted Husband, [name]           Patient will benefit from skilled therapeutic intervention in order to improve the following deficits and impairments:   Aphasia  Cognitive communication deficit    Problem List Patient Active Problem List   Diagnosis Date Noted  . 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 H/o COVID-19--- was Positive 09/17/2019, Negative 12/23/19 AND also Neg on  12/26/19 12/26/2019  . Acute bronchiolitis due to respiratory syncytial virus (RSV) 08/11/2018  . Hypomagnesemia 08/10/2018  . Hyperlipidemia 08/09/2018  . Anemia 08/09/2018  . Leukopenia 08/09/2018  . Hypokalemia 08/09/2018  . Hypocalcemia 08/09/2018  . Lactic acidosis 08/09/2018  . Sepsis ([place]) 04/25/2018    . Goals of care, counseling/discussion 11/01/2017  . Multiple myeloma ([place]) 10/29/2017  . Hypercalcemia 10/29/2017  . Type 2 diabetes mellitus without complication, without long-term current use of insulin ([place]) 01/27/2016  . Morbid obesity due to excess calories ([place]) 01/27/2016  . Anxiety as acute reaction to exceptional stress 01/13/2015  . Attention deficit hyperactivity disorder (ADHD), 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  . Gout 01/20/2014  . GERD (gastroesophageal reflux disease) 01/20/2014  . Essential hypertension, benign 01/20/2014  . Plantar fasciitis of left foot 09/19/2011   Thank you,  Genene Churn, [place]  [facility] 01/14/2020, 11:23 AM  [place] [address] [place], [place], [address] Phone: [phone]   Fax:  [phone]   Name: Joanna Reid MRN: [id number] Date of Birth: September 28, 1965

## 2020-01-18 ENCOUNTER — Ambulatory Visit
Admission: EM | Admit: 2020-01-18 | Discharge: 2020-01-18 | Disposition: A | Discharge status: Home / Self Care | Payer: BC Managed Care – PPO | Attending: Emergency Medicine | Admitting: Emergency Medicine

## 2020-01-18 ENCOUNTER — Encounter: Payer: Self-pay | Admitting: Emergency Medicine

## 2020-01-18 ENCOUNTER — Other Ambulatory Visit: Payer: Self-pay

## 2020-01-18 DIAGNOSIS — R3915 Urgency of urination: Secondary | ICD-10-CM | POA: Insufficient documentation

## 2020-01-18 DIAGNOSIS — N390 Urinary tract infection, site not specified: Secondary | ICD-10-CM | POA: Insufficient documentation

## 2020-01-18 LAB — POCT URINALYSIS DIP (MANUAL ENTRY)
Bilirubin, UA: NEGATIVE
Blood, UA: NEGATIVE
Glucose, UA: NEGATIVE mg/dL
Ketones, POC UA: NEGATIVE mg/dL
Leukocytes, UA: NEGATIVE
Nitrite, UA: POSITIVE — AB
Protein Ur, POC: NEGATIVE mg/dL
Spec Grav, UA: 1.025 (ref 1.010–1.025)
Urobilinogen, UA: 0.2 E.U./dL
pH, UA: 5.5 (ref 5.0–8.0)

## 2020-01-18 MED ORDER — NITROFURANTOIN MONOHYD MACRO 100 MG PO CAPS
100.0000 mg | ORAL_CAPSULE | Freq: Two times a day (BID) | ORAL | 0 refills | Status: DC
Start: 2020-01-18 — End: 2020-01-26

## 2020-01-18 MED ORDER — FLUCONAZOLE 150 MG PO TABS: 150.0000 mg | ORAL_TABLET | Freq: Once | ORAL | 0 refills | Status: AC

## 2020-01-18 MED ORDER — PHENAZOPYRIDINE HCL 100 MG PO TABS
100.0000 mg | ORAL_TABLET | Freq: Three times a day (TID) | ORAL | 0 refills | Status: DC | PRN
Start: 2020-01-18 — End: 2020-01-26

## 2020-01-18 NOTE — ED Triage Notes (Signed)
Pt has lower back pain and urinary urgency.  Hx of uti's

## 2020-01-18 NOTE — ED Provider Notes (Addendum)
[place]   Chief Complaint  Patient presents with   Recurrent UTI     SUBJECTIVE:  Joanna Reid is a 54 y.o. female with history of recurrent UTI  presents to the urgent care for complaint of lower back pain and urinary urgency for the past few days.  Patient denies a precipitating event, recent sexual encounter, excessive caffeine intake.  Localizes the pain to the lower back.  Pain is intermittent and achy.  Has tried OTC medications without relief.  Symptoms are made worse with urination.  Admits to similar symptoms in the past.  Denies fever, chills, nausea, vomiting, abdominal pain, flank pain, abnormal vaginal discharge or bleeding, hematuria.    LMP: No LMP recorded. Patient has had a hysterectomy.  ROS: As in HPI.  All other pertinent ROS negative.     Past Medical History:  Diagnosis Date   Acid reflux    Allergic rhinitis    Cancer ([facility])    multiple myeloma   Diabetes mellitus    type 2   Gout    Gout    HBP (high blood pressure)    History of kidney stones    Migraines    Past Surgical History:  Procedure Laterality Date   BREAST CYST EXCISION Left    2009 no visible scar on skin   CESAREAN SECTION     COLONOSCOPY WITH PROPOFOL N/A 12/25/2019   Procedure: COLONOSCOPY WITH PROPOFOL;  Surgeon: Rogene Houston, MD;  [facility];  Service: Endoscopy;  Laterality: N/A;  730   EXTRACORPOREAL SHOCK WAVE LITHOTRIPSY Left 10/10/2017   Procedure: LEFT EXTRACORPOREAL SHOCK WAVE LITHOTRIPSY (ESWL);  Surgeon: Bjorn Loser, MD;  [facility];  Service: Urology;  Laterality: Left;   EYE SURGERY     HEMORRHOID SURGERY N/A 11/19/2012   Procedure: HEMORRHOIDECTOMY;  Surgeon: Jamesetta So, MD;  Location: [facility];  Service: General;  Laterality: N/A;   kidney stones  1998   LAPAROSCOPIC UNILATERAL SALPINGO OOPHERECTOMY  05/14/2012   Procedure: LAPAROSCOPIC UNILATERAL SALPINGO OOPHORECTOMY;  Surgeon: [name], MD;   Location: [facility];  Service: Gynecology;  Laterality: Right;  laparoscopic right salpingo-oophorectomy   PARTIAL HYSTERECTOMY     POLYPECTOMY  12/25/2019   Procedure: POLYPECTOMY;  Surgeon: Rogene Houston, MD;  [facility];  Service: Endoscopy;;   TONSILECTOMY, ADENOIDECTOMY, BILATERAL MYRINGOTOMY AND TUBES     VESICOVAGINAL FISTULA CLOSURE W/ TAH     No Known Allergies No current facility-administered medications on file prior to encounter.   Current Outpatient Medications on File Prior to Encounter  Medication Sig Dispense Refill   acyclovir (ZOVIRAX) 400 MG tablet TAKE (1) TABLET BY MOUTH TWICE DAILY. 60 tablet 6   albuterol (PROVENTIL HFA;VENTOLIN HFA) 108 (90 Base) MCG/ACT inhaler Inhale 2 puffs into the lungs every 4 (four) hours as needed for wheezing or shortness of breath. (Patient not taking: Reported on 12/22/2019) 1 Inhaler 3   allopurinol (ZYLOPRIM) 300 MG tablet TAKE 1 TABLET DAILY (Patient taking differently: Take 300 mg by mouth daily. ) 90 tablet 1   aspirin EC 81 MG tablet Take 1 tablet (81 mg total) by mouth daily. 30 tablet 11   bortezomib SQ (VELCADE) 3.5 MG SOLR Inject 1.2 mLs (3 mg total) into the skin every 14 (fourteen) days for 6 doses. 7.2 mL 12   bumetanide (BUMEX) 0.5 MG tablet Take 0.5 mg by mouth daily.      carvedilol (COREG) 25 MG tablet Take 1  tablet (25 mg total) by mouth 2 (two) times daily.     cephALEXin (KEFLEX) 500 MG capsule Take 1 capsule (500 mg total) by mouth 4 (four) times daily. 28 capsule 0   cephALEXin (KEFLEX) 500 MG capsule Take one capsule po 4 times daily 28 capsule 0   diazepam (VALIUM) 5 MG tablet Take 1 tablet (5 mg total) by mouth at bedtime. 30 tablet 5   Docosanol (ABREVA) 10 % CREA Apply 1 application topically daily as needed (for nose).     docusate sodium (COLACE) 100 MG capsule Take 100 mg by mouth daily.      fluticasone (FLONASE) 50 MCG/ACT nasal spray Place 2 sprays into both nostrils daily. 16 g 0     lenalidomide (REVLIMID) 10 MG capsule TAKE 1 CAPSULE BY MOUTH  DAILY FOR 21 DAYS ON, THEN  7 DAYS OFF 21 capsule 1   losartan (COZAAR) 25 MG tablet Take 1 tablet (25 mg total) by mouth in the morning and at bedtime.     Magnesium 200 MG TABS Take 600 mg by mouth daily.     magnesium oxide (MAG-OX) 400 (241.3 Mg) MG tablet Take 1 tablet (400 mg total) by mouth 3 (three) times daily. (Patient not taking: Reported on 12/26/2019) 90 tablet 3   metFORMIN (GLUCOPHAGE) 500 MG tablet TAKE 1 TABLET BY MOUTH ONCE DAILY WITH BREAKFAST (Patient taking differently: Take 1,000 mg by mouth 2 (two) times daily with a meal. ) 90 tablet 0   pantoprazole (PROTONIX) 40 MG tablet Take 1 tablet (40 mg total) by mouth daily. 30 tablet 0   potassium chloride SA (KLOR-CON) 20 MEQ tablet TAKE (1) TABLET BY MOUTH TWICE DAILY. (Patient taking differently: Take 20 mEq by mouth daily. ) 60 tablet 5   pregabalin (LYRICA) 200 MG capsule TAKE (1) CAPSULE BY MOUTH TWICE DAILY. (Patient taking differently: Take 200 mg by mouth daily. ) 60 capsule 4   Propylene Glycol (SYSTANE BALANCE) 0.6 % SOLN Apply 1 drop to eye daily as needed (dry eye).     rosuvastatin (CRESTOR) 10 MG tablet Take 1 tablet (10 mg total) by mouth daily. 30 tablet 0   TRULICITY 1.5 KD/3.2IZ SOPN Inject 0.75 mg into the skin every Monday.      valACYclovir (VALTREX) 1000 MG tablet TAKE 2 TABLETS NOW, THEN 2 TABLETS 12 HOURS LATER. 12 tablet 0   Social History   Socioeconomic History   Marital status: Married    Spouse name: Not on file   Number of children: Not on file   Years of education: 12   Highest education level: Not on file  Occupational History    Employer: [employer]  Tobacco Use   Smoking status: Former Smoker    Quit date: 02/27/1999    Years since quitting: 20.9   Smokeless tobacco: Never Used   Tobacco comment: socially   Scientific laboratory technician Use: Never used  Substance and Sexual Activity   Alcohol use: No     Alcohol/week: 0.0 standard drinks   Drug use: No   Sexual activity: Yes  Other Topics Concern   Not on file  Social History Narrative   Not on file   Social Determinants of Health   Financial Resource Strain: Low Risk    Difficulty of Paying Living Expenses: Not hard at all  Food Insecurity: No Food Insecurity   Worried About Charity fundraiser in the Last Year: Never true   [place] in the  Last Year: Never true  Transportation Needs: No Transportation Needs   Lack of Transportation (Medical): No   Lack of Transportation (Non-Medical): No  Physical Activity: Insufficiently Active   Days of Exercise per Week: 7 days   Minutes of Exercise per Session: 10 min  Stress:    Feeling of Stress :   Social Connections: Engineer, building services of Communication with Friends and Family: More than three times a week   Frequency of Social Gatherings with Friends and Family: More than three times a week   Attends Religious Services: More than 4 times per year   Active Member of [employer] or Organizations: Yes   Attends Music therapist: More than 4 times per year   Marital Status: Married  Human resources officer Violence: Not At Risk   Fear of Current or Ex-Partner: No   Emotionally Abused: No   Physically Abused: No   Sexually Abused: No   Family History  Problem Relation Age of Onset   Arthritis Other    Cancer Other    Diabetes Other    Hypertension Mother    Dementia Mother    Diabetes Father    ALS Father    Diabetes Brother    Hypertension Brother    Cancer Paternal Aunt    COPD Maternal Grandmother    Cancer Maternal Grandfather    Anesthesia problems Paternal Grandfather     OBJECTIVE:  Vitals:   01/18/20 1238  BP: (!) 156/104  Pulse: 63  Resp: 16  Temp: (!) 97.5 F (36.4 C)  TempSrc: Oral  SpO2: 98%   General appearance: AOx3 in no acute distress HEENT: NCAT.  Oropharynx clear.  Lungs: clear to  auscultation bilaterally without adventitious breath sounds Heart: regular rate and rhythm.  Radial pulses 2+ symmetrical bilaterally Abdomen: soft; non-distended; no tenderness; bowel sounds present; no guarding or rebound tenderness Back: no CVA tenderness Extremities: no edema; symmetrical with no gross deformities Skin: warm and dry Neurologic: Ambulates from chair to exam table without difficulty Psychological: alert and cooperative; normal mood and affect  Labs Reviewed  POCT URINALYSIS DIP (MANUAL ENTRY) - Abnormal; Notable for the following components:      Result Value   Nitrite, UA Positive (*)    All other components within normal limits  URINE CULTURE    ASSESSMENT & PLAN:  1. Acute lower UTI   2. Urinary urgency     Meds ordered this encounter  Medications   nitrofurantoin, macrocrystal-monohydrate, (MACROBID) 100 MG capsule    Sig: Take 1 capsule (100 mg total) by mouth 2 (two) times daily.    Dispense:  10 capsule    Refill:  0   phenazopyridine (PYRIDIUM) 100 MG tablet    Sig: Take 1 tablet (100 mg total) by mouth 3 (three) times daily as needed for pain.    Dispense:  10 tablet    Refill:  0   Patient stable at discharge.  POCT urine analysis was positive with a nitrite. This  suggest a gram-negative bacteria.  Macrobid will be prescribed.  Diflucan was prescribed as patient states she developed yeast infection after completing antibiotic   Discharge instructions Urine culture sent.  We will call you with the results.   Push fluids and get plenty of rest.   Take antibiotic as directed and to completion Take pyridium as prescribed and as needed for symptomatic relief Follow up with PCP if symptoms persists Return here or go to ER if you have any  new or worsening symptoms such as fever, worsening abdominal pain, nausea/vomiting, flank pain, etc...  Outlined signs and symptoms indicating need for more acute intervention. Patient verbalized  understanding. After Visit Summary given.     Note: This document was prepared using Dragon voice recognition software and may include unintentional dictation errors.    Emerson Monte, FNP 01/18/20 1258    Emerson Monte, FNP 01/18/20 1305

## 2020-01-18 NOTE — Discharge Instructions (Addendum)
Urine culture sent.  We will call you with the results.   Push fluids and get plenty of rest.   Take antibiotic as directed and to completion Take pyridium as prescribed and as needed for symptomatic relief Follow up with PCP if symptoms persists Return here or go to ER if you have any new or worsening symptoms such as fever, worsening abdominal pain, nausea/vomiting, flank pain, etc... 

## 2020-01-19 ENCOUNTER — Inpatient Hospital Stay (HOSPITAL_COMMUNITY): Payer: BC Managed Care – PPO | Attending: Hematology

## 2020-01-19 ENCOUNTER — Inpatient Hospital Stay (HOSPITAL_COMMUNITY): Payer: BC Managed Care – PPO | Admitting: Hematology

## 2020-01-19 ENCOUNTER — Other Ambulatory Visit: Payer: Self-pay

## 2020-01-19 ENCOUNTER — Ambulatory Visit (HOSPITAL_COMMUNITY): Payer: BC Managed Care – PPO

## 2020-01-19 ENCOUNTER — Other Ambulatory Visit (HOSPITAL_COMMUNITY): Payer: BC Managed Care – PPO

## 2020-01-19 ENCOUNTER — Ambulatory Visit: Payer: BC Managed Care – PPO | Admitting: Family Medicine

## 2020-01-19 VITALS — BP 161/92 | HR 63 | Temp 97.1°F | Resp 18 | Wt 250.1 lb

## 2020-01-19 DIAGNOSIS — Z7984 Long term (current) use of oral hypoglycemic drugs: Secondary | ICD-10-CM | POA: Insufficient documentation

## 2020-01-19 DIAGNOSIS — C9000 Multiple myeloma not having achieved remission: Secondary | ICD-10-CM | POA: Diagnosis present

## 2020-01-19 DIAGNOSIS — Z79899 Other long term (current) drug therapy: Secondary | ICD-10-CM | POA: Insufficient documentation

## 2020-01-19 DIAGNOSIS — G629 Polyneuropathy, unspecified: Secondary | ICD-10-CM | POA: Diagnosis not present

## 2020-01-19 DIAGNOSIS — Z87440 Personal history of urinary (tract) infections: Secondary | ICD-10-CM | POA: Diagnosis not present

## 2020-01-19 DIAGNOSIS — Z8673 Personal history of transient ischemic attack (TIA), and cerebral infarction without residual deficits: Secondary | ICD-10-CM | POA: Diagnosis not present

## 2020-01-19 DIAGNOSIS — Z9484 Stem cells transplant status: Secondary | ICD-10-CM | POA: Diagnosis not present

## 2020-01-19 DIAGNOSIS — E119 Type 2 diabetes mellitus without complications: Secondary | ICD-10-CM | POA: Diagnosis not present

## 2020-01-19 DIAGNOSIS — Z809 Family history of malignant neoplasm, unspecified: Secondary | ICD-10-CM | POA: Insufficient documentation

## 2020-01-19 DIAGNOSIS — Z7902 Long term (current) use of antithrombotics/antiplatelets: Secondary | ICD-10-CM | POA: Diagnosis not present

## 2020-01-19 DIAGNOSIS — C9001 Multiple myeloma in remission: Secondary | ICD-10-CM

## 2020-01-19 DIAGNOSIS — Z87891 Personal history of nicotine dependence: Secondary | ICD-10-CM | POA: Diagnosis not present

## 2020-01-19 LAB — CBC WITH DIFFERENTIAL/PLATELET
Abs Immature Granulocytes: 0 K/uL (ref 0.00–0.07)
Basophils Absolute: 0 K/uL (ref 0.0–0.1)
Basophils Relative: 1 %
Eosinophils Absolute: 0.1 K/uL (ref 0.0–0.5)
Eosinophils Relative: 2 %
HCT: 33.8 % — ABNORMAL LOW (ref 36.0–46.0)
Hemoglobin: 11.1 g/dL — ABNORMAL LOW (ref 12.0–15.0)
Immature Granulocytes: 0 %
Lymphocytes Relative: 35 %
Lymphs Abs: 1.2 K/uL (ref 0.7–4.0)
MCH: 30.8 pg (ref 26.0–34.0)
MCHC: 32.8 g/dL (ref 30.0–36.0)
MCV: 93.9 fL (ref 80.0–100.0)
Monocytes Absolute: 0.4 K/uL (ref 0.1–1.0)
Monocytes Relative: 12 %
Neutro Abs: 1.7 K/uL (ref 1.7–7.7)
Neutrophils Relative %: 50 %
Platelets: 161 K/uL (ref 150–400)
RBC: 3.6 MIL/uL — ABNORMAL LOW (ref 3.87–5.11)
RDW: 14.8 % (ref 11.5–15.5)
WBC: 3.4 K/uL — ABNORMAL LOW (ref 4.0–10.5)
nRBC: 0 % (ref 0.0–0.2)

## 2020-01-19 LAB — COMPREHENSIVE METABOLIC PANEL WITH GFR
ALT: 13 U/L (ref 0–44)
AST: 15 U/L (ref 15–41)
Albumin: 4 g/dL (ref 3.5–5.0)
Alkaline Phosphatase: 63 U/L (ref 38–126)
Anion gap: 8 (ref 5–15)
BUN: 24 mg/dL — ABNORMAL HIGH (ref 6–20)
CO2: 24 mmol/L (ref 22–32)
Calcium: 9.5 mg/dL (ref 8.9–10.3)
Chloride: 108 mmol/L (ref 98–111)
Creatinine, Ser: 1.09 mg/dL — ABNORMAL HIGH (ref 0.44–1.00)
GFR calc Af Amer: >60 mL/min
GFR calc non Af Amer: 57 mL/min — ABNORMAL LOW
Glucose, Bld: 124 mg/dL — ABNORMAL HIGH (ref 70–99)
Potassium: 4.2 mmol/L (ref 3.5–5.1)
Sodium: 140 mmol/L (ref 135–145)
Total Bilirubin: 0.5 mg/dL (ref 0.3–1.2)
Total Protein: 6.7 g/dL (ref 6.5–8.1)

## 2020-01-19 MED ORDER — ACYCLOVIR 400 MG PO TABS: ORAL_TABLET | ORAL | 6 refills | Status: DC

## 2020-01-19 NOTE — Patient Instructions (Signed)
[place] at [facility] Discharge Instructions  You were seen today by Dr. Delton Coombes. He went over your recent results. Your treatment will be put on hold until Dr. Delton Coombes consults with your neurologist and your symptoms improve. Dr. Delton Coombes will see you back in 3 weeks for labs and follow up.   Thank you for choosing [place] at [facility] to provide your oncology and hematology care.  To afford each patient quality time with our provider, please arrive at least 15 minutes before your scheduled appointment time.   If you have a lab appointment with the [facility] please come in thru the Main Entrance and check in at the main information desk  You need to re-schedule your appointment should you arrive 10 or more minutes late.  We strive to give you quality time with our providers, and arriving late affects you and other patients whose appointments are after yours.  Also, if you no show three or more times for appointments you may be dismissed from the clinic at the providers discretion.     Again, thank you for choosing [facility].  Our hope is that these requests will decrease the amount of time that you wait before being seen by our physicians.       _____________________________________________________________  Should you have questions after your visit to [facility], please contact our office at [phone] between the hours of 8:00 a.m. and 4:30 p.m.  Voicemails left after 4:00 p.m. will not be returned until the following business day.  For prescription refill requests, have your pharmacy contact our office and allow 72 hours.    Cancer Center Support Programs:   > Cancer Support Group  2nd Tuesday of the month 1pm-2pm, Journey Room

## 2020-01-19 NOTE — Progress Notes (Signed)
[place], [place] [address]   CLINIC:  Medical Oncology/Hematology  PCP:  [name], DO [address] /  [place] [address]  [phone]  REASON FOR VISIT:  Follow-up for multiple myeloma  PRIOR THERAPY:  1. RVD x 4 cycles from 11/01/2017 through 01/14/2018. 2. Stem cell transplant on 02/20/2018  CURRENT THERAPY: Revlimid  INTERVAL HISTORY:  Ms. BRITTIN BELNAP, a 54 y.o. female, returns for routine follow-up for her multiple myeloma. Rosanne was last seen on 12/22/2019.  Today she is accompanied by her husband. She went to [place] on 12/26/2019 for a CVA and was taken to [facility] for neurological treatment. She is going to rehab twice a week and occupational rehab once a week. She continues having difficulty finding the right words to say, as well as relearning how to read and write. Her strength is equal on both sides. She is currently on Plavix and ASA, and she is currently taking metformin twice daily.  She was noted to have a UTI in [facility] and was put on Keflex, then she went to [facility] on 01/18/2020 and was prescribed Macrobid.  Her next neurology appointment is on 01/25/2020.   REVIEW OF SYSTEMS:  Review of Systems  Constitutional: Negative for appetite change.  Genitourinary: Positive for difficulty urinating.   Musculoskeletal: Positive for back pain (lower back pain d/t UTI).  All other systems reviewed and are negative.   PAST MEDICAL/SURGICAL HISTORY:  Past Medical History:  Diagnosis Date  . Acid reflux   . Allergic rhinitis   . Cancer ([place])    multiple myeloma  . Diabetes mellitus    type 2  . Gout   . Gout   . HBP (high blood pressure)   . History of kidney stones   . Migraines    Past Surgical History:  Procedure Laterality Date  . BREAST CYST EXCISION Left    2009 no visible scar on skin  . CESAREAN SECTION    . COLONOSCOPY WITH PROPOFOL N/A 12/25/2019   Procedure: COLONOSCOPY WITH PROPOFOL;  Surgeon:  Rogene Houston, MD;  Location: [facility];  Service: Endoscopy;  Laterality: N/A;  730  . EXTRACORPOREAL SHOCK WAVE LITHOTRIPSY Left 10/10/2017   Procedure: LEFT EXTRACORPOREAL SHOCK WAVE LITHOTRIPSY (ESWL);  Surgeon: Bjorn Loser, MD;  Location: [facility];  Service: Urology;  Laterality: Left;  . EYE SURGERY    . HEMORRHOID SURGERY N/A 11/19/2012   Procedure: HEMORRHOIDECTOMY;  Surgeon: Jamesetta So, MD;  Location: [facility];  Service: General;  Laterality: N/A;  . kidney stones  1998  . LAPAROSCOPIC UNILATERAL SALPINGO OOPHERECTOMY  05/14/2012   Procedure: LAPAROSCOPIC UNILATERAL SALPINGO OOPHORECTOMY;  Surgeon: [name], MD;  Location: [facility];  Service: Gynecology;  Laterality: Right;  laparoscopic right salpingo-oophorectomy  . PARTIAL HYSTERECTOMY    . POLYPECTOMY  12/25/2019   Procedure: POLYPECTOMY;  Surgeon: Rogene Houston, MD;  Location: [facility];  Service: Endoscopy;;  . TONSILECTOMY, ADENOIDECTOMY, BILATERAL MYRINGOTOMY AND TUBES    . VESICOVAGINAL FISTULA CLOSURE W/ TAH      SOCIAL HISTORY:  Social History   Socioeconomic History  . Marital status: Married    Spouse name: Not on file  . Number of children: Not on file  . Years of education: 60  . Highest education level: Not on file  Occupational History    Employer: [employer]  Tobacco Use  . Smoking status: Former Smoker    Quit date: 02/27/1999  Years since quitting: 20.9  . Smokeless tobacco: Never Used  . Tobacco comment: socially   Vaping Use  . Vaping Use: Never used  Substance and Sexual Activity  . Alcohol use: No    Alcohol/week: 0.0 standard drinks  . Drug use: No  . Sexual activity: Yes  Other Topics Concern  . Not on file  Social History Narrative  . Not on file   Social Determinants of Health   Financial Resource Strain: Low Risk   . Difficulty of Paying Living Expenses: Not hard at all  Food Insecurity: No Food Insecurity  . Worried About Charity fundraiser in the Last Year: Never  true  . Ran Out of Food in the Last Year: Never true  Transportation Needs: No Transportation Needs  . Lack of Transportation (Medical): No  . Lack of Transportation (Non-Medical): No  Physical Activity: Insufficiently Active  . Days of Exercise per Week: 7 days  . Minutes of Exercise per Session: 10 min  Stress:   . Feeling of Stress :   Social Connections: Socially Integrated  . Frequency of Communication with Friends and Family: More than three times a week  . Frequency of Social Gatherings with Friends and Family: More than three times a week  . Attends Religious Services: More than 4 times per year  . Active Member of Clubs or Organizations: Yes  . Attends Archivist Meetings: More than 4 times per year  . Marital Status: Married  Human resources officer Violence: Not At Risk  . Fear of Current or Ex-Partner: No  . Emotionally Abused: No  . Physically Abused: No  . Sexually Abused: No    FAMILY HISTORY:  Family History  Problem Relation Age of Onset  . Arthritis Other   . Cancer Other   . Diabetes Other   . Hypertension Mother   . Dementia Mother   . Diabetes Father   . ALS Father   . Diabetes Brother   . Hypertension Brother   . Cancer Paternal Aunt   . COPD Maternal Grandmother   . Cancer Maternal Grandfather   . Anesthesia problems Paternal Grandfather     CURRENT MEDICATIONS:  Current Outpatient Medications  Medication Sig Dispense Refill  . acyclovir (ZOVIRAX) 400 MG tablet TAKE (1) TABLET BY MOUTH TWICE DAILY. 60 tablet 6  . allopurinol (ZYLOPRIM) 300 MG tablet TAKE 1 TABLET DAILY (Patient taking differently: Take 300 mg by mouth daily. ) 90 tablet 1  . aspirin EC 81 MG tablet Take 1 tablet (81 mg total) by mouth daily. 30 tablet 11  . bortezomib SQ (VELCADE) 3.5 MG SOLR Inject 1.2 mLs (3 mg total) into the skin every 14 (fourteen) days for 6 doses. 7.2 mL 12  . bumetanide (BUMEX) 0.5 MG tablet Take 0.5 mg by mouth daily.     . carvedilol (COREG) 25 MG  tablet Take 1 tablet (25 mg total) by mouth 2 (two) times daily.    . clopidogrel (PLAVIX) 75 MG tablet Take 75 mg by mouth daily.     . diazepam (VALIUM) 5 MG tablet Take 1 tablet (5 mg total) by mouth at bedtime. 30 tablet 5  . docusate sodium (COLACE) 100 MG capsule Take 100 mg by mouth daily.     . fluconazole (DIFLUCAN) 150 MG tablet Take 150 mg by mouth every 3 (three) days.    . fluticasone (FLONASE) 50 MCG/ACT nasal spray Place 2 sprays into both nostrils daily. 16 g 0  . lenalidomide (  REVLIMID) 10 MG capsule TAKE 1 CAPSULE BY MOUTH  DAILY FOR 21 DAYS ON, THEN  7 DAYS OFF 21 capsule 1  . losartan (COZAAR) 25 MG tablet Take 1 tablet (25 mg total) by mouth in the morning and at bedtime.    . Magnesium 200 MG TABS Take 600 mg by mouth daily.    . magnesium oxide (MAG-OX) 400 (241.3 Mg) MG tablet Take 1 tablet (400 mg total) by mouth 3 (three) times daily. 90 tablet 3  . metFORMIN (GLUCOPHAGE) 500 MG tablet TAKE 1 TABLET BY MOUTH ONCE DAILY WITH BREAKFAST (Patient taking differently: Take 1,000 mg by mouth 2 (two) times daily with a meal. ) 90 tablet 0  . nitrofurantoin, macrocrystal-monohydrate, (MACROBID) 100 MG capsule Take 1 capsule (100 mg total) by mouth 2 (two) times daily. 10 capsule 0  . pantoprazole (PROTONIX) 40 MG tablet Take 1 tablet (40 mg total) by mouth daily. 30 tablet 0  . potassium chloride SA (KLOR-CON) 20 MEQ tablet TAKE (1) TABLET BY MOUTH TWICE DAILY. (Patient taking differently: Take 20 mEq by mouth daily. ) 60 tablet 5  . pregabalin (LYRICA) 200 MG capsule TAKE (1) CAPSULE BY MOUTH TWICE DAILY. (Patient taking differently: Take 200 mg by mouth daily. ) 60 capsule 4  . rosuvastatin (CRESTOR) 10 MG tablet Take 1 tablet (10 mg total) by mouth daily. 30 tablet 0  . TRULICITY 1.5 VV/7.4MO SOPN Inject 0.75 mg into the skin every Monday.     . valACYclovir (VALTREX) 1000 MG tablet TAKE 2 TABLETS NOW, THEN 2 TABLETS 12 HOURS LATER. 12 tablet 0  . albuterol (PROVENTIL  HFA;VENTOLIN HFA) 108 (90 Base) MCG/ACT inhaler Inhale 2 puffs into the lungs every 4 (four) hours as needed for wheezing or shortness of breath. (Patient not taking: Reported on 01/19/2020) 1 Inhaler 3  . Docosanol (ABREVA) 10 % CREA Apply 1 application topically daily as needed (for nose). (Patient not taking: Reported on 01/19/2020)    . phenazopyridine (PYRIDIUM) 100 MG tablet Take 1 tablet (100 mg total) by mouth 3 (three) times daily as needed for pain. (Patient not taking: Reported on 01/19/2020) 10 tablet 0  . prochlorperazine (COMPAZINE) 10 MG tablet every 14 (fourteen) days (Patient not taking: Reported on 01/19/2020)    . Propylene Glycol (SYSTANE BALANCE) 0.6 % SOLN Apply 1 drop to eye daily as needed (dry eye). (Patient not taking: Reported on 01/19/2020)     No current facility-administered medications for this visit.    ALLERGIES:  No Known Allergies  PHYSICAL EXAM:  Performance status (ECOG): 1 - Symptomatic but completely ambulatory  Vitals:   01/19/20 1015  BP: (!) 161/92  Pulse: 63  Resp: 18  Temp: (!) 97.1 F (36.2 C)  SpO2: 98%   Wt Readings from Last 3 Encounters:  01/19/20 250 lb 1.6 oz (113.4 kg)  12/31/19 251 lb 6.4 oz (114 kg)  12/26/19 250 lb 10.6 oz (113.7 kg)   Physical Exam Vitals reviewed.  Constitutional:      Appearance: Normal appearance. She is obese.  Cardiovascular:     Rate and Rhythm: Normal rate and regular rhythm.     Pulses: Normal pulses.     Heart sounds: Normal heart sounds.  Pulmonary:     Effort: Pulmonary effort is normal.     Breath sounds: Normal breath sounds.  Neurological:     General: No focal deficit present.     Mental Status: She is alert and oriented to person, place, and time.  Psychiatric:        Mood and Affect: Mood normal.        Behavior: Behavior normal.     LABORATORY DATA:  I have reviewed the labs as listed.  CBC Latest Ref Rng & Units 01/19/2020 12/31/2019 12/28/2019  WBC 4.0 - 10.5 K/uL 3.4(L) 2.6(L)  1.8(L)  Hemoglobin 12.0 - 15.0 g/dL 11.1(L) 10.9(L) 10.1(L)  Hematocrit 36 - 46 % 33.8(L) 32.3(L) 31.1(L)  Platelets 150 - 400 K/uL 161 174 93(L)   CMP Latest Ref Rng & Units 01/19/2020 12/31/2019 12/28/2019  Glucose 70 - 99 mg/dL 124(H) 102(H) 96  BUN 6 - 20 mg/dL 24(H) 19 12  Creatinine 0.44 - 1.00 mg/dL 1.09(H) 1.24(H) 1.22(H)  Sodium 135 - 145 mmol/L 140 142 141  Potassium 3.5 - 5.1 mmol/L 4.2 4.7 3.8  Chloride 98 - 111 mmol/L 108 104 111  CO2 22 - 32 mmol/L 24 23 23   Calcium 8.9 - 10.3 mg/dL 9.5 9.8 8.5(L)  Total Protein 6.5 - 8.1 g/dL 6.7 - -  Total Bilirubin 0.3 - 1.2 mg/dL 0.5 - -  Alkaline Phos 38 - 126 U/L 63 - -  AST 15 - 41 U/L 15 - -  ALT 0 - 44 U/L 13 - -      Component Value Date/Time   RBC 3.60 (L) 01/19/2020 0940   MCV 93.9 01/19/2020 0940   MCV 92 12/31/2019 1126   MCH 30.8 01/19/2020 0940   MCHC 32.8 01/19/2020 0940   RDW 14.8 01/19/2020 0940   RDW 15.9 (H) 12/31/2019 1126   LYMPHSABS 1.2 01/19/2020 0940   LYMPHSABS 2.9 08/02/2015 1113   MONOABS 0.4 01/19/2020 0940   EOSABS 0.1 01/19/2020 0940   EOSABS 0.2 08/02/2015 1113   BASOSABS 0.0 01/19/2020 0940   BASOSABS 0.1 08/02/2015 1113   Lab Results  Component Value Date   TOTALPROTELP 6.0 12/08/2019   TOTALPROTELP 6.1 12/08/2019   ALBUMINELP 3.4 12/08/2019   A1GS 0.2 12/08/2019   A2GS 0.6 12/08/2019   BETS 1.0 12/08/2019   GAMS 0.8 12/08/2019   MSPIKE Not Observed 12/08/2019   SPEI Comment 12/08/2019    Lab Results  Component Value Date   KPAFRELGTCHN 32.1 (H) 12/08/2019   LAMBDASER 19.6 12/08/2019   KAPLAMBRATIO 1.64 12/08/2019   Lab Results  Component Value Date   LDH 124 12/08/2019   LDH 108 11/09/2019   LDH 121 10/12/2019    DIAGNOSTIC IMAGING:  I have independently reviewed the scans and discussed with the patient. CT Code Stroke CTA Head W/WO contrast  Result Date: 12/26/2019 CLINICAL DATA:  Expressive aphasia. Last known normal at 11 p.m. last evening. EXAM: CT ANGIOGRAPHY HEAD  AND NECK CT PERFUSION BRAIN TECHNIQUE: Multidetector CT imaging of the head and neck was performed using the standard protocol during bolus administration of intravenous contrast. Multiplanar CT image reconstructions and MIPs were obtained to evaluate the vascular anatomy. Carotid stenosis measurements (when applicable) are obtained utilizing NASCET criteria, using the distal internal carotid diameter as the denominator. Multiphase CT imaging of the brain was performed following IV bolus contrast injection. Subsequent parametric perfusion maps were calculated using RAPID software. CONTRAST:  100 mL Omnipaque 350 COMPARISON:  CT head without contrast 12/26/2019 FINDINGS: CTA NECK FINDINGS Aortic arch: A 3 vessel arch configuration is present. Mild atherosclerotic changes are present without significant stenosis or aneurysm. Right carotid system: The right common carotid artery is within normal limits. Bifurcation is unremarkable. Moderate tortuosity is present in the cervical right ICA without significant stenosis.  Left carotid system: The left common carotid artery is within normal limits. Atherosclerotic changes are noted at the bifurcation. Moderate tortuosity is present in the cervical left ICA without significant stenosis. Vertebral arteries: The vertebral arteries are codominant. Tortuosity is noted proximally without significant stenosis. Both vertebral arteries originate from the subclavian arteries. Atherosclerotic changes are noted on the right without significant stenosis. No significant stenosis is present in either vertebral artery in the neck. Skeleton: Multilevel degenerative changes are present cervical spine. Slight degenerative anterolisthesis is present at C2-3 and C3-4. Multilevel endplate changes are present at C4-5 most notably. No focal lytic or blastic lesions are present. Other neck: The soft tissues the neck otherwise within normal limits. No neck lesions are present. Salivary glands are  within normal limits. Thyroid is normal. No significant adenopathy is present. Upper chest: The lung apices are clear. Thoracic inlet is within normal limits. Review of the MIP images confirms the above findings CTA HEAD FINDINGS Anterior circulation: Atherosclerotic calcifications are present within the cavernous internal carotid arteries bilaterally without significant stenosis. Terminal ICA is normal bilaterally. The A1 and M1 segments are normal. The anterior communicating artery is patent. MCA bifurcations are intact. Moderate attenuation of distal branch vessels is present without a significant proximal stenosis or occlusion. No aneurysm is present. Posterior circulation: The vertebral arteries are codominant. PICA origins are visualized and normal. The left V4 segment is hypoplastic. Vertebrobasilar junction is normal. Mild narrowing is present the mid basilar artery, to 50%. Both posterior cerebral arteries originate from basilar tip. The PCA branch vessels demonstrate distal attenuation without a significant proximal stenosis or occlusion. Venous sinuses: The dural sinuses are patent. The straight sinus deep cerebral veins are intact. Cortical veins are unremarkable. Anatomic variants: None Review of the MIP images confirms the above findings CT Brain Perfusion Findings: ASPECTS: 10/10 CBF (<30%) Volume: 24m Perfusion (Tmax>6.0s) volume: 048mMismatch Volume: 2448mnfarction Location:Left parietal lobe, watershed region. IMPRESSION: 1. 24 mL left parietal lobe watershed region area of ischemia without defined core infarct. 2. No emergent large vessel occlusion. 3. Mild narrowing of the mid basilar artery without other significant proximal stenosis, aneurysm, or branch vessel occlusion. 4. Moderate distal small vessel disease in both the anterior and posterior circulation. 5. Moderate tortuosity of the cervical internal carotid arteries bilaterally, likely secondary to hypertension. 6. Atherosclerotic  changes at the left carotid bifurcation and cavernous internal carotid arteries bilaterally without significant stenosis. 7. Multilevel degenerative changes in the cervical spine. These results were called by telephone at the time of interpretation on 12/26/2019 at 11:31 am to provider Dr. TriLangston Maskerho verbally acknowledged these results. Electronically Signed   By: ChrSan MorelleD.   On: 12/26/2019 11:31   CT Code Stroke CTA Neck W/WO contrast  Result Date: 12/26/2019 CLINICAL DATA:  Expressive aphasia. Last known normal at 11 p.m. last evening. EXAM: CT ANGIOGRAPHY HEAD AND NECK CT PERFUSION BRAIN TECHNIQUE: Multidetector CT imaging of the head and neck was performed using the standard protocol during bolus administration of intravenous contrast. Multiplanar CT image reconstructions and MIPs were obtained to evaluate the vascular anatomy. Carotid stenosis measurements (when applicable) are obtained utilizing NASCET criteria, using the distal internal carotid diameter as the denominator. Multiphase CT imaging of the brain was performed following IV bolus contrast injection. Subsequent parametric perfusion maps were calculated using RAPID software. CONTRAST:  100 mL Omnipaque 350 COMPARISON:  CT head without contrast 12/26/2019 FINDINGS: CTA NECK FINDINGS Aortic arch: A 3 vessel arch configuration is present. Mild  atherosclerotic changes are present without significant stenosis or aneurysm. Right carotid system: The right common carotid artery is within normal limits. Bifurcation is unremarkable. Moderate tortuosity is present in the cervical right ICA without significant stenosis. Left carotid system: The left common carotid artery is within normal limits. Atherosclerotic changes are noted at the bifurcation. Moderate tortuosity is present in the cervical left ICA without significant stenosis. Vertebral arteries: The vertebral arteries are codominant. Tortuosity is noted proximally without significant  stenosis. Both vertebral arteries originate from the subclavian arteries. Atherosclerotic changes are noted on the right without significant stenosis. No significant stenosis is present in either vertebral artery in the neck. Skeleton: Multilevel degenerative changes are present cervical spine. Slight degenerative anterolisthesis is present at C2-3 and C3-4. Multilevel endplate changes are present at C4-5 most notably. No focal lytic or blastic lesions are present. Other neck: The soft tissues the neck otherwise within normal limits. No neck lesions are present. Salivary glands are within normal limits. Thyroid is normal. No significant adenopathy is present. Upper chest: The lung apices are clear. Thoracic inlet is within normal limits. Review of the MIP images confirms the above findings CTA HEAD FINDINGS Anterior circulation: Atherosclerotic calcifications are present within the cavernous internal carotid arteries bilaterally without significant stenosis. Terminal ICA is normal bilaterally. The A1 and M1 segments are normal. The anterior communicating artery is patent. MCA bifurcations are intact. Moderate attenuation of distal branch vessels is present without a significant proximal stenosis or occlusion. No aneurysm is present. Posterior circulation: The vertebral arteries are codominant. PICA origins are visualized and normal. The left V4 segment is hypoplastic. Vertebrobasilar junction is normal. Mild narrowing is present the mid basilar artery, to 50%. Both posterior cerebral arteries originate from basilar tip. The PCA branch vessels demonstrate distal attenuation without a significant proximal stenosis or occlusion. Venous sinuses: The dural sinuses are patent. The straight sinus deep cerebral veins are intact. Cortical veins are unremarkable. Anatomic variants: None Review of the MIP images confirms the above findings CT Brain Perfusion Findings: ASPECTS: 10/10 CBF (<30%) Volume: 34m Perfusion  (Tmax>6.0s) volume: 038mMismatch Volume: 2440mnfarction Location:Left parietal lobe, watershed region. IMPRESSION: 1. 24 mL left parietal lobe watershed region area of ischemia without defined core infarct. 2. No emergent large vessel occlusion. 3. Mild narrowing of the mid basilar artery without other significant proximal stenosis, aneurysm, or branch vessel occlusion. 4. Moderate distal small vessel disease in both the anterior and posterior circulation. 5. Moderate tortuosity of the cervical internal carotid arteries bilaterally, likely secondary to hypertension. 6. Atherosclerotic changes at the left carotid bifurcation and cavernous internal carotid arteries bilaterally without significant stenosis. 7. Multilevel degenerative changes in the cervical spine. These results were called by telephone at the time of interpretation on 12/26/2019 at 11:31 am to provider Dr. TriLangston Maskerho verbally acknowledged these results. Electronically Signed   By: ChrSan MorelleD.   On: 12/26/2019 11:31   MR BRAIN WO CONTRAST  Result Date: 12/27/2019 CLINICAL DATA:  Initial evaluation for acute neuro deficit, stroke suspected. EXAM: MRI HEAD WITHOUT CONTRAST TECHNIQUE: Multiplanar, multiecho pulse sequences of the brain and surrounding structures were obtained without intravenous contrast. COMPARISON:  Prior CTs from 12/26/2019. FINDINGS: Brain: Cerebral volume within normal limits for age. Mild chronic microvascular ischemic disease noted within the periventricular white matter and pons. Superimposed small remote right cerebellar infarcts. Confluent area of restricted diffusion seen involving the left parietal lobe, consistent with an acute left MCA territory infarct. Finding corresponds with perfusion abnormality seen  on prior exam. There are a few additional scattered foci of punctate cortical and subcortical ischemic infarcts involving the bilateral frontal and parietal lobes (series 5, images 94, 91, 89). Additional  punctate acute ischemic infarct noted at the superior left cerebellum (series 5, image 67). No associated hemorrhage or mass effect about these infarcts. Gray-white matter differentiation otherwise maintained. No evidence for acute intracranial hemorrhage. Single chronic microhemorrhage noted at the left lentiform nucleus, of doubtful significance in isolation. No mass lesion, midline shift or mass effect. No hydrocephalus or extra-axial fluid collection. Pituitary gland suprasellar region normal. Midline structures intact. Vascular: Major intracranial vascular flow voids are maintained. Skull and upper cervical spine: Craniocervical junction within normal limits. Bone marrow signal intensity normal. No scalp soft tissue abnormality. Sinuses/Orbits: Globes and orbital soft tissues within normal limits. Mild scattered mucoperiosteal thickening noted within the ethmoidal air cells and maxillary sinuses. Left-to-right nasal septal deviation noted. Trace left mastoid effusion, of doubtful significance. Inner ear structures grossly normal. Other: None. IMPRESSION: 1. 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 No associated hemorrhage or mass effect. 2. Additional few scattered punctate acute ischemic nonhemorrhagic infarcts involving the bilateral frontal and parietal lobes as well as the left cerebellum as above. 3. Underlying mild chronic microvascular ischemic disease with small remote right cerebellar infarcts. Electronically Signed   By: Jeannine Boga M.D.   On: 12/27/2019 06:48   CT Code Stroke Cerebral Perfusion with contrast  Result Date: 12/26/2019 CLINICAL DATA:  Expressive aphasia. Last known normal at 11 p.m. last evening. EXAM: CT ANGIOGRAPHY HEAD AND NECK CT PERFUSION BRAIN TECHNIQUE: Multidetector CT imaging of the head and neck was performed using the standard protocol during bolus  administration of intravenous contrast. Multiplanar CT image reconstructions and MIPs were obtained to evaluate the vascular anatomy. Carotid stenosis measurements (when applicable) are obtained utilizing NASCET criteria, using the distal internal carotid diameter as the denominator. Multiphase CT imaging of the brain was performed following IV bolus contrast injection. Subsequent parametric perfusion maps were calculated using RAPID software. CONTRAST:  100 mL Omnipaque 350 COMPARISON:  CT head without contrast 12/26/2019 FINDINGS: CTA NECK FINDINGS Aortic arch: A 3 vessel arch configuration is present. Mild atherosclerotic changes are present without significant stenosis or aneurysm. Right carotid system: The right common carotid artery is within normal limits. Bifurcation is unremarkable. Moderate tortuosity is present in the cervical right ICA without significant stenosis. Left carotid system: The left common carotid artery is within normal limits. Atherosclerotic changes are noted at the bifurcation. Moderate tortuosity is present in the cervical left ICA without significant stenosis. Vertebral arteries: The vertebral arteries are codominant. Tortuosity is noted proximally without significant stenosis. Both vertebral arteries originate from the subclavian arteries. Atherosclerotic changes are noted on the right without significant stenosis. No significant stenosis is present in either vertebral artery in the neck. Skeleton: Multilevel degenerative changes are present cervical spine. Slight degenerative anterolisthesis is present at C2-3 and C3-4. Multilevel endplate changes are present at C4-5 most notably. No focal lytic or blastic lesions are present. Other neck: The soft tissues the neck otherwise within normal limits. No neck lesions are present. Salivary glands are within normal limits. Thyroid is normal. No significant adenopathy is present. Upper chest: The lung apices are clear. Thoracic inlet is within  normal limits. Review of the MIP images confirms the above findings CTA HEAD FINDINGS Anterior circulation: Atherosclerotic calcifications are present within the cavernous internal carotid  arteries bilaterally without significant stenosis. Terminal ICA is normal bilaterally. The A1 and M1 segments are normal. The anterior communicating artery is patent. MCA bifurcations are intact. Moderate attenuation of distal branch vessels is present without a significant proximal stenosis or occlusion. No aneurysm is present. Posterior circulation: The vertebral arteries are codominant. PICA origins are visualized and normal. The left V4 segment is hypoplastic. Vertebrobasilar junction is normal. Mild narrowing is present the mid basilar artery, to 50%. Both posterior cerebral arteries originate from basilar tip. The PCA branch vessels demonstrate distal attenuation without a significant proximal stenosis or occlusion. Venous sinuses: The dural sinuses are patent. The straight sinus deep cerebral veins are intact. Cortical veins are unremarkable. Anatomic variants: None Review of the MIP images confirms the above findings CT Brain Perfusion Findings: ASPECTS: 10/10 CBF (<30%) Volume: 51m Perfusion (Tmax>6.0s) volume: 033mMismatch Volume: 2452mnfarction Location:Left parietal lobe, watershed region. IMPRESSION: 1. 24 mL left parietal lobe watershed region area of ischemia without defined core infarct. 2. No emergent large vessel occlusion. 3. Mild narrowing of the mid basilar artery without other significant proximal stenosis, aneurysm, or branch vessel occlusion. 4. Moderate distal small vessel disease in both the anterior and posterior circulation. 5. Moderate tortuosity of the cervical internal carotid arteries bilaterally, likely secondary to hypertension. 6. Atherosclerotic changes at the left carotid bifurcation and cavernous internal carotid arteries bilaterally without significant stenosis. 7. Multilevel degenerative  changes in the cervical spine. These results were called by telephone at the time of interpretation on 12/26/2019 at 11:31 am to provider Dr. TriLangston Maskerho verbally acknowledged these results. Electronically Signed   By: ChrSan MorelleD.   On: 12/26/2019 11:31   ECHOCARDIOGRAM COMPLETE  Result Date: 12/26/2019    ECHOCARDIOGRAM REPORT   Patient Name:   ROBPERSEPHONE SCHRIEVERte of Exam: 12/26/2019 Medical Rec #:  [id number]     Height:       64.0 in Accession #:    [id number]    Weight:       253.3 lb Date of Birth:  [birth year]     BSA:          2.163 m Patient Age:    [age]         BP:           149/74 mmHg Patient Gender: F                HR:           52 bpm. Exam [facility]  [facility]: 2D Echo Indications:    stroke 434.91  History:        Patient has prior history of Echocardiogram examinations, most                 recent 12/08/2014. Risk Factors:Diabetes and Former Smoker. High                 blood pressure. cancer.  Sonographer:    VijJannett CelestineCS (AE) Referring Phys: [id number] COUDulaney Eye Instituteonographer Comments: Image acquisition challenging due to patient body habitus. limited mobility IMPRESSIONS  1. Left ventricular ejection fraction, by estimation, is 60 to 65%. The left ventricle has normal function. The left ventricle has no regional wall motion abnormalities. There is mild left ventricular hypertrophy of the basal-septal segment. Left ventricular diastolic function could not be evaluated.  2. Right ventricular systolic function is normal. The right ventricular size is normal. Tricuspid  regurgitation signal is inadequate for assessing PA pressure.  3. The mitral valve is normal in structure. Trivial mitral valve regurgitation. No evidence of mitral stenosis.  4. The aortic valve is normal in structure. Aortic valve regurgitation is trivial. No aortic stenosis is present. FINDINGS  Left Ventricle: Left ventricular ejection fraction, by estimation, is 60 to 65%. The left  ventricle has normal function. The left ventricle has no regional wall motion abnormalities. The left ventricular internal cavity size was normal in size. There is  mild left ventricular hypertrophy of the basal-septal segment. Left ventricular diastolic function could not be evaluated. Right Ventricle: The right ventricular size is normal. No increase in right ventricular wall thickness. Right ventricular systolic function is normal. Tricuspid regurgitation signal is inadequate for assessing PA pressure. Left Atrium: Left atrial size was normal in size. Right Atrium: Right atrial size was normal in size. Pericardium: There is no evidence of pericardial effusion. Mitral Valve: The mitral valve is normal in structure. Normal mobility of the mitral valve leaflets. Trivial mitral valve regurgitation. No evidence of mitral valve stenosis. Tricuspid Valve: The tricuspid valve is normal in structure. Tricuspid valve regurgitation is trivial. No evidence of tricuspid stenosis. Aortic Valve: The aortic valve is normal in structure. Aortic valve regurgitation is trivial. No aortic stenosis is present. Pulmonic Valve: The pulmonic valve was normal in structure. Pulmonic valve regurgitation is not visualized. No evidence of pulmonic stenosis. Aorta: The aortic root is normal in size and structure. Venous: The inferior vena cava was not well visualized. IAS/Shunts: No atrial level shunt detected by color flow Doppler.  LEFT VENTRICLE PLAX 2D LVIDd:         4.91 cm LVIDs:         2.93 cm LV PW:         0.99 cm LV IVS:        1.18 cm LVOT diam:     2.10 cm LV SV:         58 LV SV Index:   27 LVOT Area:     3.46 cm  RIGHT VENTRICLE TAPSE (M-mode): 2.9 cm LEFT ATRIUM             Index LA diam:        3.80 cm 1.76 cm/m LA Vol (A2C):   85.0 ml 39.30 ml/m LA Vol (A4C):   56.1 ml 25.94 ml/m LA Biplane Vol: 72.8 ml 33.66 ml/m  AORTIC VALVE LVOT Vmax:   70.50 cm/s LVOT Vmean:  55.200 cm/s LVOT VTI:    0.167 m  AORTA Ao Root diam:  3.40 cm MITRAL VALVE MV Area (PHT): 1.86 cm    SHUNTS MV Decel Time: 407 msec    Systemic VTI:  0.17 m MV E velocity: 61.30 cm/s  Systemic Diam: 2.10 cm MV A velocity: 45.00 cm/s MV E/A ratio:  1.36 Fransico Him MD Electronically signed by Fransico Him MD Signature Date/Time: 12/26/2019/4:47:12 PM    Final    CT HEAD CODE STROKE WO CONTRAST  Result Date: 12/26/2019 CLINICAL DATA:  Code stroke. Sudden onset of abnormal speech beginning this morning. Last known normal was 11 p.m. last night. EXAM: CT HEAD WITHOUT CONTRAST TECHNIQUE: Contiguous axial images were obtained from the base of the skull through the vertex without intravenous contrast. COMPARISON:  CT maxillofacial 04/22/2016 FINDINGS: Brain: No acute infarct, hemorrhage, or mass lesion is present. Basal ganglia are normal. Insular ribbon is normal bilaterally. No significant white matter lesions are present. No focal cortical lesions are  present. The brainstem and cerebellum are within normal limits. Vascular: No hyperdense vessel or unexpected calcification. Skull: Calvarium is intact. No focal lytic or blastic lesions are present. No significant extracranial soft tissue lesion is present. Sinuses/Orbits: The right maxillary sinus is shrunken suggesting a history of chronic disease. No active disease is present. The paranasal sinuses and mastoid air cells are otherwise clear. The globes and orbits are within normal limits. ASPECTS [facility] Stroke Program Early CT Score) - Ganglionic level infarction (caudate, lentiform nuclei, internal capsule, insula, M1-M3 cortex): 7/7 - Supraganglionic infarction (M4-M6 cortex): 3/3 Total score (0-10 with 10 being normal): 10/10 IMPRESSION: 1. Normal CT appearance of the brain. 2. ASPECTS is 10/10. These results were called by telephone at the time of interpretation on 12/26/2019 at 10:35 am to provider [facility] , who verbally acknowledged these results. Electronically Signed   By: San Morelle M.D.    On: 12/26/2019 10:35     ASSESSMENT:  1. IgG lambda multiple myeloma, stage III, del 17 p: -4 cycles of RVD from 11/01/2017 through 01/14/2018. -Stem cell [facility] on 02/20/2018 at [facility]. -Maintenance Velcade every 2 weeks and Revlimid 10 mg 3 weeks on/1 week off started on 07/29/2018. -Myeloma panel from 12/08/2018 shows M spike not observed.  Kappa light chains of 32.1 with ratio of 1.64.  Immunofixation was negative. 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  MRI of the brain on 12/27/2019 shows acute ischemic nonhemorrhagic left MCA territory infarct involving left parietal lobe, corresponding with perfusion deficit on CT scan angiogram.  No associated hemorrhage or mass-effect.  Additional few scattered punctate acute ischemic nonhemorrhagic infarcts involving bilateral frontal and parietal lobes as well as left cerebellum. -Last Velcade treatment was on 12/22/2019.  We have held Revlimid since then.   PLAN:  1. IgG lambda multiple myeloma, stage III, del 17 p: -She had CVA with aphasia on 12/26/2019.  Revlimid and Velcade have been held. -Aphasia is improved.  She is undergoing occupational and speech therapy. -I have reviewed her labs today.  CBC and CMP are grossly within normal range.  Creatinine is 1.09 and calcium 9.5. -I will continue to hold her maintenance therapy at this time.  She will have myeloma labs in 2 weeks and come back in 3 weeks. -We will have to think of alternative agents for myeloma maintenance.  2. Hypomagnesemia: -Continue magnesium supplements at home.  3. Bone strengthening agents: -Continue Zometa 3 mg every 3 months.  4. Peripheral neuropathy: -Continue Lyrica 200 mg twice daily.  5. ID prophylaxis: -Continue acyclovir twice daily.  6. Diabetes: -Continue Metformin and Trulicity.  Orders placed this encounter:  No orders of the defined types were placed in this encounter.    Derek Jack, MD [place] [phone]    I, Milinda Antis, am acting as a scribe for Dr. Sanda Linger.  I, Derek Jack MD, have reviewed the above documentation for accuracy and completeness, and I agree with the above.

## 2020-01-20 ENCOUNTER — Ambulatory Visit (HOSPITAL_COMMUNITY): Payer: BC Managed Care – PPO | Admitting: Speech Pathology

## 2020-01-20 ENCOUNTER — Encounter (HOSPITAL_COMMUNITY): Payer: Self-pay | Admitting: Speech Pathology

## 2020-01-20 ENCOUNTER — Other Ambulatory Visit: Payer: Self-pay

## 2020-01-20 DIAGNOSIS — R41841 Cognitive communication deficit: Secondary | ICD-10-CM

## 2020-01-20 DIAGNOSIS — R4701 Aphasia: Secondary | ICD-10-CM

## 2020-01-20 DIAGNOSIS — R278 Other lack of coordination: Secondary | ICD-10-CM | POA: Diagnosis not present

## 2020-01-20 NOTE — Therapy (Signed)
[place], [place], [address] Phone: [phone]   Fax:  [phone]  Speech Language Pathology Treatment  Patient Details  Name: Joanna Reid MRN: [id number] Date of Birth: Mar 29, 1966 Referring Provider (SLP): Lala Lund, MD   Encounter Date: 01/20/2020   End of Session - 01/20/20 1107    Visit Number 6    Number of Visits 16    Authorization Type [employer] Comm PPO   $30 copay, 60 combined visit limit, OOP max $[id number]   SLP Start Time 1771    SLP Stop Time  1120    SLP Time Calculation (min) 45 min    Activity Tolerance Patient tolerated treatment well           Past Medical History:  Diagnosis Date  . Acid reflux   . Allergic rhinitis   . Cancer ([place])    multiple myeloma  . Diabetes mellitus    type 2  . Gout   . Gout   . HBP (high blood pressure)   . History of kidney stones   . Migraines     Past Surgical History:  Procedure Laterality Date  . BREAST CYST EXCISION Left    2009 no visible scar on skin  . CESAREAN SECTION    . COLONOSCOPY WITH PROPOFOL N/A 12/25/2019   Procedure: COLONOSCOPY WITH PROPOFOL;  Surgeon: Rogene Houston, MD;  Location: [facility];  Service: Endoscopy;  Laterality: N/A;  730  . EXTRACORPOREAL SHOCK WAVE LITHOTRIPSY Left 10/10/2017   Procedure: LEFT EXTRACORPOREAL SHOCK WAVE LITHOTRIPSY (ESWL);  Surgeon: Bjorn Loser, MD;  Location: [facility];  Service: Urology;  Laterality: Left;  . EYE SURGERY    . HEMORRHOID SURGERY N/A 11/19/2012   Procedure: HEMORRHOIDECTOMY;  Surgeon: Jamesetta So, MD;  Location: [facility] ORS;  Service: General;  Laterality: N/A;  . kidney stones  1998  . LAPAROSCOPIC UNILATERAL SALPINGO OOPHERECTOMY  05/14/2012   Procedure: LAPAROSCOPIC UNILATERAL SALPINGO OOPHORECTOMY;  Surgeon: [name], MD;  Location: [facility] OR[facility];  Service: Gynecology;  Laterality: Right;  laparoscopic right salpingo-oophorectomy  . PARTIAL HYSTERECTOMY    . POLYPECTOMY   12/25/2019   Procedure: POLYPECTOMY;  Surgeon: Rogene Houston, MD;  Location: [facility];  Service: Endoscopy;;  . TONSILECTOMY, ADENOIDECTOMY, BILATERAL MYRINGOTOMY AND TUBES    . VESICOVAGINAL FISTULA CLOSURE W/ TAH      There were no vitals filed for this visit.   Subjective Assessment - 01/20/20 1046    Subjective "I had a UTI."    Patient is accompained by: Family member    Currently in Pain? No/denies            ADULT SLP TREATMENT - 01/20/20 0001      General Information   Behavior/Cognition Alert;Cooperative;Pleasant mood    Patient Positioning Upright in chair    Oral care provided N/A    HPI Pt admitted on 12/26/19 for stroke. TPA note given, outside of time window. MRI 12/27/19 acute ischemic nonhemorrhagic left MCA territory infarct involving the left parietal lobe, few scattered punctate acute ischemic nonhemorrhagic infarcts bilateral frontal and parietal lobes as wellas the left cerebellum, small remote right cerebellar infarcts. PMH Essential hypertension,  obesity, Multiple myeloma ([place]), DM, Anemia, COVID. Pt is referred for outpatient SLP evaluation and treatment for aphasia from acute CVA.      Treatment Provided   Treatment provided Cognitive-Linquistic      Pain Assessment   Pain Assessment No/denies pain  Cognitive-Linquistic Treatment   Treatment focused on Aphasia;Patient/family/caregiver education    Skilled Treatment Answering Wh-questions, use of communication support page, implementing word finding and oral reading strategies, use of orthographic cues, patient centered approach, reading comprehension, self evaluation, and oral reading tasks      Assessment / Recommendations / [place] with current plan of care      Progression Toward Goals   Progression toward goals Progressing toward goals              SLP Short Term Goals - 01/20/20 1109      SLP SHORT TERM GOAL #1   Title Pt will implement word-finding strategies with  80% accuracy when unable to verbalize desired word in conversation/functional tasks with mod assist.    Baseline ~60%    Time 4    Period Weeks    Status On-going    Target Date 01/29/20      SLP SHORT TERM GOAL #2   Title Pt will describe objects and pictures by providing at least three salient features as judged by clinician with 90% acc when provided min cues.    Baseline 66%    Time 4    Period Weeks    Status On-going    Target Date 01/29/20      SLP SHORT TERM GOAL #3   Title Pt will increase divergent naming to 7+ items per concrete category with min cues.    Baseline 5    Time 4    Period Weeks    Status On-going    Target Date 01/29/20      SLP SHORT TERM GOAL #4   Title Pt will complete basic sentence level reading comprehension tasks with 80% acc and min cues for error awareness.    Baseline 65% sentence level with multiple choice response    Time 4    Period Weeks    Status On-going    Target Date 01/29/20      SLP SHORT TERM GOAL #5   Title Pt will be able to write personal/bio information with 90% acc when provided mod cues    Baseline Able to copy name only    Time 4    Period Weeks    Status On-going    Target Date 01/29/20      SLP SHORT TERM GOAL #6   Title Pt will respond to open-ended questions using a complete sentence with 80% acc and min cues.    Baseline telegraphic responses    Time 4    Period Weeks    Status On-going    Target Date 01/29/20            SLP Long Term Goals - 01/20/20 1123      SLP LONG TERM GOAL #1   Title Pt will communicate moderately complex thoughts and feelings to [facility] with use of strategies as needed.    Baseline mod assist    Time 8    Period Weeks    Status On-going            Plan - 01/20/20 1109    Clinical Impression Statement Pt accompanied to therapy by her husband today. She completed HEP with 95% acc. Oral reading skills have improved. During Pt led conversation regarding current and past medical  history, Pt required mi/mod cues to utilize compensatory strategies (specifically use of communication support template and written cues). She completed oral reading of "st" and "str" words with 100% acc with  allowance for self corrections. She was asked to explain the meaning of specific words (moderately complex words) by giving salient features and did so with 100% acc when provided min cues (words included: scar, ambitious, bored, and tattoo). Additional HEP provided.  Continue plan of care.    Speech Therapy Frequency 2x / week    Duration Other (comment)   8 weeks   Treatment/Interventions SLP instruction and feedback;Internal/external aids;Compensatory strategies;Compensatory techniques;Patient/family education;Language facilitation;Cueing hierarchy;Multimodal communcation approach    Potential to Achieve Goals Good    SLP Home Exercise Plan Pt will complete HEP as assigned to facilitate carryover of treatment strategies and techniques in home environment with assist from spouse as needed.    Consulted and Agree with Plan of Care Patient;Family member/caregiver    Family Member Consulted Husband, [name]           Patient will benefit from skilled therapeutic intervention in order to improve the following deficits and impairments:   Cognitive communication deficit  Aphasia    Problem List Patient Active Problem List   Diagnosis Date Noted  . 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 H/o COVID-19--- was Positive 09/17/2019, Negative 12/23/19 AND also Neg on  12/26/19 12/26/2019  . Acute bronchiolitis due to respiratory syncytial virus (RSV) 08/11/2018  . Hypomagnesemia 08/10/2018  . Hyperlipidemia 08/09/2018  . Anemia 08/09/2018  . Leukopenia 08/09/2018  . Hypokalemia 08/09/2018  . Hypocalcemia 08/09/2018  . Lactic acidosis 08/09/2018  . Sepsis ([place]) 04/25/2018  . Goals of care, counseling/discussion 11/01/2017  . Multiple  myeloma ([place]) 10/29/2017  . Hypercalcemia 10/29/2017  . Type 2 diabetes mellitus without complication, without long-term current use of insulin ([place]) 01/27/2016  . Morbid obesity due to excess calories ([place]) 01/27/2016  . Anxiety as acute reaction to exceptional stress 01/13/2015  . Attention deficit hyperactivity disorder (ADHD), 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  . Gout 01/20/2014  . GERD (gastroesophageal reflux disease) 01/20/2014  . Essential hypertension, benign 01/20/2014  . Plantar fasciitis of left foot 09/19/2011   Thank you,  Genene Churn, Ava  [facility] 01/20/2020, 11:24 AM  [place] [address] [place], [place], [address] Phone: [phone]   Fax:  [phone]   Name: LENITA PEREGRINA MRN: [id number] Date of Birth: 14-Feb-1966

## 2020-01-21 ENCOUNTER — Ambulatory Visit (HOSPITAL_COMMUNITY): Payer: BC Managed Care – PPO | Admitting: Speech Pathology

## 2020-01-21 ENCOUNTER — Encounter (HOSPITAL_COMMUNITY): Payer: Self-pay | Admitting: Speech Pathology

## 2020-01-21 DIAGNOSIS — R41841 Cognitive communication deficit: Secondary | ICD-10-CM

## 2020-01-21 DIAGNOSIS — R278 Other lack of coordination: Secondary | ICD-10-CM | POA: Diagnosis not present

## 2020-01-21 DIAGNOSIS — R4701 Aphasia: Secondary | ICD-10-CM

## 2020-01-21 LAB — URINE CULTURE: Culture: >=100000 — AB

## 2020-01-21 NOTE — Therapy (Signed)
[place], [place], [address] Phone: [phone]   Fax:  [phone]  Speech Language Pathology Treatment  Patient Details  Name: Joanna Reid MRN: [id number] Date of Birth: [birth year] Referring Provider (SLP): Lala Lund, MD   Encounter Date: 01/21/2020   End of Session - 01/21/20 1100    Visit Number 7    Number of Visits 16    Authorization Type [employer] Comm PPO   $30 copay, 60 combined visit limit, OOP max $[id number]   SLP Start Time 8841    SLP Stop Time  1125    SLP Time Calculation (min) 45 min    Activity Tolerance Patient tolerated treatment well           Past Medical History:  Diagnosis Date  . Acid reflux   . Allergic rhinitis   . Cancer ([place])    multiple myeloma  . Diabetes mellitus    type 2  . Gout   . Gout   . HBP (high blood pressure)   . History of kidney stones   . Migraines     Past Surgical History:  Procedure Laterality Date  . BREAST CYST EXCISION Left    2009 no visible scar on skin  . CESAREAN SECTION    . COLONOSCOPY WITH PROPOFOL N/A 12/25/2019   Procedure: COLONOSCOPY WITH PROPOFOL;  Surgeon: Rogene Houston, MD;  [facility];  Service: Endoscopy;  Laterality: N/A;  730  . EXTRACORPOREAL SHOCK WAVE LITHOTRIPSY Left 10/10/2017   Procedure: LEFT EXTRACORPOREAL SHOCK WAVE LITHOTRIPSY (ESWL);  Surgeon: Bjorn Loser, MD;  Location: [facility];  Service: Urology;  Laterality: Left;  . EYE SURGERY    . HEMORRHOID SURGERY N/A 11/19/2012   Procedure: HEMORRHOIDECTOMY;  Surgeon: Jamesetta So, MD;  Location: [facility];  Service: General;  Laterality: N/A;  . kidney stones  1998  . LAPAROSCOPIC UNILATERAL SALPINGO OOPHERECTOMY  05/14/2012   Procedure: LAPAROSCOPIC UNILATERAL SALPINGO OOPHORECTOMY;  Surgeon: [name], MD;  Location: [facility];  Service: Gynecology;  Laterality: Right;  laparoscopic right salpingo-oophorectomy  . PARTIAL HYSTERECTOMY    . POLYPECTOMY   12/25/2019   Procedure: POLYPECTOMY;  Surgeon: Rogene Houston, MD;  [facility];  Service: Endoscopy;;  . TONSILECTOMY, ADENOIDECTOMY, BILATERAL MYRINGOTOMY AND TUBES    . VESICOVAGINAL FISTULA CLOSURE W/ TAH      There were no vitals filed for this visit.   Subjective Assessment - 01/21/20 1047    Subjective "This is my grandson."    Patient is accompained by: Family member    Currently in Pain? No/denies             ADULT SLP TREATMENT - 01/21/20 1059      General Information   Behavior/Cognition Alert;Cooperative;Pleasant mood    Patient Positioning Upright in chair    Oral care provided N/A    HPI Pt admitted on 12/26/19 for stroke. TPA note given, outside of time window. MRI 12/27/19 acute ischemic nonhemorrhagic left MCA territory infarct involving the left parietal lobe, few scattered punctate acute ischemic nonhemorrhagic infarcts bilateral frontal and parietal lobes as wellas the left cerebellum, small remote right cerebellar infarcts. PMH Essential hypertension,  obesity, Multiple myeloma ([place]), DM, Anemia, COVID. Pt is referred for outpatient SLP evaluation and treatment for aphasia from acute CVA.      Treatment Provided   Treatment provided Cognitive-Linquistic      Pain Assessment   Pain Assessment No/denies pain  Cognitive-Linquistic Treatment   Treatment focused on Aphasia;Patient/family/caregiver education    Skilled Treatment Answering Wh-questions, use of communication support page, implementing word finding and oral reading strategies, use of orthographic cues, patient centered approach, reading comprehension, self evaluation, and oral reading tasks      Assessment / Recommendations / [place] with current plan of care      Progression Toward Goals   Progression toward goals Progressing toward goals              SLP Short Term Goals - 01/21/20 1102      SLP SHORT TERM GOAL #1   Title Pt will implement word-finding  strategies with 80% accuracy when unable to verbalize desired word in conversation/functional tasks with mod assist.    Baseline ~60%    Time 4    Period Weeks    Status On-going    Target Date 01/29/20      SLP SHORT TERM GOAL #2   Title Pt will describe objects and pictures by providing at least three salient features as judged by clinician with 90% acc when provided min cues.    Baseline 66%    Time 4    Period Weeks    Status On-going    Target Date 01/29/20      SLP SHORT TERM GOAL #3   Title Pt will increase divergent naming to 7+ items per concrete category with min cues.    Baseline 5    Time 4    Period Weeks    Status On-going    Target Date 01/29/20      SLP SHORT TERM GOAL #4   Title Pt will complete basic sentence level reading comprehension tasks with 80% acc and min cues for error awareness.    Baseline 65% sentence level with multiple choice response    Time 4    Period Weeks    Status On-going    Target Date 01/29/20      SLP SHORT TERM GOAL #5   Title Pt will be able to write personal/bio information with 90% acc when provided mod cues    Baseline Able to copy name only    Time 4    Period Weeks    Status On-going    Target Date 01/29/20      SLP SHORT TERM GOAL #6   Title Pt will respond to open-ended questions using a complete sentence with 80% acc and min cues.    Baseline telegraphic responses    Time 4    Period Weeks    Status On-going    Target Date 01/29/20            SLP Long Term Goals - 01/20/20 1123      SLP LONG TERM GOAL #1   Title Pt will communicate moderately complex thoughts and feelings to [facility] with use of strategies as needed.    Baseline mod assist    Time 8    Period Weeks    Status On-going            Plan - 01/21/20 1101    Clinical Impression Statement Pt accompanied to therapy by her daughter and grandson today. She completed convergent naming tasks with 100% and divergent naming tasks with 100% acc. She  completed HEP with 95% acc. Additional HEP provided.  Continue plan of care.    Speech Therapy Frequency 2x / week    Duration Other (comment)   8 weeks  Treatment/Interventions SLP instruction and feedback;Internal/external aids;Compensatory strategies;Compensatory techniques;Patient/family education;Language facilitation;Cueing hierarchy;Multimodal communcation approach    Potential to Achieve Goals Good    SLP Home Exercise Plan Pt will complete HEP as assigned to facilitate carryover of treatment strategies and techniques in home environment with assist from spouse as needed.    Consulted and Agree with Plan of Care Patient;Family member/caregiver    Family Member Consulted Husband, [name]           Patient will benefit from skilled therapeutic intervention in order to improve the following deficits and impairments:   Cognitive communication deficit  Aphasia    Problem List Patient Active Problem List   Diagnosis Date Noted  . 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 H/o COVID-19--- was Positive 09/17/2019, Negative 12/23/19 AND also Neg on  12/26/19 12/26/2019  . Acute bronchiolitis due to respiratory syncytial virus (RSV) 08/11/2018  . Hypomagnesemia 08/10/2018  . Hyperlipidemia 08/09/2018  . Anemia 08/09/2018  . Leukopenia 08/09/2018  . Hypokalemia 08/09/2018  . Hypocalcemia 08/09/2018  . Lactic acidosis 08/09/2018  . Sepsis ([place]) 04/25/2018  . Goals of care, counseling/discussion 11/01/2017  . Multiple myeloma ([place]) 10/29/2017  . Hypercalcemia 10/29/2017  . Type 2 diabetes mellitus without complication, without long-term current use of insulin ([place]) 01/27/2016  . Morbid obesity due to excess calories ([place]) 01/27/2016  . Anxiety as acute reaction to exceptional stress 01/13/2015  . Attention deficit hyperactivity disorder (ADHD), 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  . Gout 01/20/2014  . GERD (gastroesophageal reflux  disease) 01/20/2014  . Essential hypertension, benign 01/20/2014  . Plantar fasciitis of left foot 09/19/2011   Thank you,  Genene Churn, [place]  [facility] 01/21/2020, 11:09 AM  [place] [address] [place], [place], [address] Phone: [phone]   Fax:  [phone]   Name: Joanna Reid MRN: [id number] Date of Birth: February 18, 1966

## 2020-01-22 ENCOUNTER — Other Ambulatory Visit: Payer: Self-pay

## 2020-01-22 ENCOUNTER — Ambulatory Visit (HOSPITAL_COMMUNITY): Payer: BC Managed Care – PPO | Admitting: Specialist

## 2020-01-22 ENCOUNTER — Encounter (HOSPITAL_COMMUNITY): Payer: Self-pay | Admitting: Specialist

## 2020-01-22 DIAGNOSIS — R29898 Other symptoms and signs involving the musculoskeletal system: Secondary | ICD-10-CM

## 2020-01-22 DIAGNOSIS — R278 Other lack of coordination: Secondary | ICD-10-CM | POA: Diagnosis not present

## 2020-01-22 NOTE — Therapy (Signed)
[place], [place], [address] Phone: [phone]   Fax:  [phone]  Occupational Therapy Treatment  Patient Details  Name: Joanna Reid MRN: [id number] Date of Birth: 1965-09-15 Referring Provider (OT): Dr. Lala Lund   Encounter Date: 01/22/2020   OT End of Session - 01/22/20 1219    Visit Number 2    Number of Visits 3    Date for OT Re-Evaluation 02/05/20    Authorization Type [employer] Comm PPO; $30 copay    Authorization Time Period 60 visit limit combined    Authorization - Visit Number 7    Authorization - Number of Visits 45    OT Start Time 0945    OT Stop Time 1030    OT Time Calculation (min) 45 min    Activity Tolerance Patient tolerated treatment well    Behavior During Therapy [facility] for tasks assessed/performed           Past Medical History:  Diagnosis Date  . Acid reflux   . Allergic rhinitis   . Cancer ([place])    multiple myeloma  . Diabetes mellitus    type 2  . Gout   . Gout   . HBP (high blood pressure)   . History of kidney stones   . Migraines     Past Surgical History:  Procedure Laterality Date  . BREAST CYST EXCISION Left    2009 no visible scar on skin  . CESAREAN SECTION    . COLONOSCOPY WITH PROPOFOL N/A 12/25/2019   Procedure: COLONOSCOPY WITH PROPOFOL;  Surgeon: Rogene Houston, MD;  [facility];  Service: Endoscopy;  Laterality: N/A;  730  . EXTRACORPOREAL SHOCK WAVE LITHOTRIPSY Left 10/10/2017   Procedure: LEFT EXTRACORPOREAL SHOCK WAVE LITHOTRIPSY (ESWL);  Surgeon: Bjorn Loser, MD;  [facility];  Service: Urology;  Laterality: Left;  . EYE SURGERY    . HEMORRHOID SURGERY N/A 11/19/2012   Procedure: HEMORRHOIDECTOMY;  Surgeon: Jamesetta So, MD;  Location: [facility];  Service: General;  Laterality: N/A;  . kidney stones  1998  . LAPAROSCOPIC UNILATERAL SALPINGO OOPHERECTOMY  05/14/2012   Procedure: LAPAROSCOPIC UNILATERAL SALPINGO  OOPHORECTOMY;  Surgeon: [name], MD;  Location: [facility];  Service: Gynecology;  Laterality: Right;  laparoscopic right salpingo-oophorectomy  . PARTIAL HYSTERECTOMY    . POLYPECTOMY  12/25/2019   Procedure: POLYPECTOMY;  Surgeon: Rogene Houston, MD;  [facility];  Service: Endoscopy;;  . TONSILECTOMY, ADENOIDECTOMY, BILATERAL MYRINGOTOMY AND TUBES    . VESICOVAGINAL FISTULA CLOSURE W/ TAH      There were no vitals filed for this visit.   Subjective Assessment - 01/22/20 1006    Subjective  S:  I have been practicing writing and cutting.  its getting better              [facility] OT Assessment - 01/22/20 0001      Assessment   Medical Diagnosis s/p left CVA    Referring Provider (OT) Dr. Lala Lund      Precautions   Precautions None      Restrictions   Weight Bearing Restrictions No                    OT Treatments/Exercises (OP) - 01/22/20 0001      Exercises   Exercises Hand      Hand Exercises   Sponges 24 x 2 without difficulty  Fine Motor Coordination (Hand/Wrist)   Fine Motor Coordination Purdue Pegboard;Grooved pegs;Handwriting;Manipulation of small objects;Tracing    Manipulation of small objects tricky fingers design copy game - able to hold game cartridge and maneuver marbles to copy design independently using bilateral hands.  good finger isolation demonstrated during activity    Handwriting reviewed handwriting - has been practicing handwriting - has steadily improved in accuracy of letters and speed of letter production.  attempted use of pencil grip, however patient did not feel this was beneficial    Tracing OT drew a curvy/loopy line 11 inches in length and had patient trace.  She is able to trace with excellent accuracy in small segments.  When prompted to trace entire line without picking up pencil, she had increased difficulty and less accuracy.  Encouraged her to practice this technique at home.     Grooved pegs able to  place pegs 1 by 1 without compensatory movements, then removed holding 5 in her hand at a time and then translating pegs 1 x 1 to pincer grasp to deposit back in container with min difficulty    Purdue Pegboard used tweezers to place peg, collar, and flat disc in peg board, initially using compensatory arm movements and alternative grip on tweezers, able to self correct, use forearm as a stabalizer on table and appropriate grip on tweezers.  collar piece most difficult for patient to manipulate.                     OT Short Term Goals - 01/22/20 1239      OT SHORT TERM GOAL #1   Title Pt will be provided with and educated on HEP to improve fine motor coordination required for texting and handwriting.    Time 4    Period Weeks    Status Achieved    Target Date 02/07/20      OT SHORT TERM GOAL #2   Title Pt will demonstrate improved fine motor coordination in right hand by completing 9 hole peg test in under 23"    Time 4    Period Weeks    Status On-going      OT SHORT TERM GOAL #3   Title Pt will demonstrate improved handwriting by writing a 5 word sentence using smooth strokes and acknowledging lines 4/5 trials.    Time 4    Period Weeks    Status On-going                    Plan - 01/22/20 1234    Clinical Impression Statement A:  patient is making steady improvements in fine motor coordination and handwriting.  most challenging activity today was purdue peg board with tweezers, patient using compensatory arm movements and hand placement initially, which she was able to self correct.  Able to trace in small increments WNL, encouraged patient to practice tracing long lines without picking up pencil in prep for handwriting and cursive writing.    Body Structure / Function / Physical Skills ADL;UE functional use;FMC;Coordination;IADL    Plan P:  Reassess for possible dc, follow up on tracing without picking up pencil and have patient copy a paragraph for handwriting  sustained activity tolerance.    Consulted and Agree with Plan of Care Patient;Family member/caregiver           Patient will benefit from skilled therapeutic intervention in order to improve the following deficits and impairments:   Body Structure / Function / Physical Skills:  ADL, UE functional use, [place], Coordination, IADL       Visit Diagnosis: Other symptoms and signs involving the musculoskeletal system  Other lack of coordination    Problem List Patient Active Problem List   Diagnosis Date Noted  . 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 H/o COVID-19--- was Positive 09/17/2019, Negative 12/23/19 AND also Neg on  12/26/19 12/26/2019  . Acute bronchiolitis due to respiratory syncytial virus (RSV) 08/11/2018  . Hypomagnesemia 08/10/2018  . Hyperlipidemia 08/09/2018  . Anemia 08/09/2018  . Leukopenia 08/09/2018  . Hypokalemia 08/09/2018  . Hypocalcemia 08/09/2018  . Lactic acidosis 08/09/2018  . Sepsis ([place]) 04/25/2018  . Goals of care, counseling/discussion 11/01/2017  . Multiple myeloma ([place]) 10/29/2017  . Hypercalcemia 10/29/2017  . Type 2 diabetes mellitus without complication, without long-term current use of insulin ([place]) 01/27/2016  . Morbid obesity due to excess calories ([place]) 01/27/2016  . Anxiety as acute reaction to exceptional stress 01/13/2015  . Attention deficit hyperactivity disorder (ADHD), 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  . Gout 01/20/2014  . GERD (gastroesophageal reflux disease) 01/20/2014  . Essential hypertension, benign 01/20/2014  . Plantar fasciitis of left foot 09/19/2011    Vangie Bicker, , OTR/L [phone]  01/22/2020, 12:40 PM  [place], [place], [address] Phone: [phone]   Fax:  [phone]  Name: ASLEY [place] MRN: [id number] Date of Birth: [birth year]

## 2020-01-25 ENCOUNTER — Encounter (HOSPITAL_COMMUNITY): Payer: Self-pay | Admitting: Speech Pathology

## 2020-01-25 ENCOUNTER — Other Ambulatory Visit: Payer: Self-pay

## 2020-01-25 ENCOUNTER — Ambulatory Visit (HOSPITAL_COMMUNITY): Payer: BC Managed Care – PPO | Admitting: Speech Pathology

## 2020-01-25 DIAGNOSIS — R4701 Aphasia: Secondary | ICD-10-CM

## 2020-01-25 DIAGNOSIS — R278 Other lack of coordination: Secondary | ICD-10-CM | POA: Diagnosis not present

## 2020-01-25 DIAGNOSIS — R41841 Cognitive communication deficit: Secondary | ICD-10-CM

## 2020-01-25 NOTE — Therapy (Signed)
[place], [place], [address] Phone: [phone]   Fax:  [phone]  Speech Language Pathology Treatment  Patient Details  Name: Joanna Reid MRN: [id number] Date of Birth: May 13, 1966 Referring Provider (SLP): Lala Lund, MD   Encounter Date: 01/25/2020   End of Session - 01/25/20 1105    Visit Number 8    Number of Visits 16    Date for SLP Re-Evaluation 03/01/20    Authorization Type [employer] PPO   $30 copay, 60 combined visit limit, OOP max $[id number]   SLP Start Time 1030    SLP Stop Time  1115    SLP Time Calculation (min) 45 min    Activity Tolerance Patient tolerated treatment well           Past Medical History:  Diagnosis Date  . Acid reflux   . Allergic rhinitis   . Cancer ([place])    multiple myeloma  . Diabetes mellitus    type 2  . Gout   . Gout   . HBP (high blood pressure)   . History of kidney stones   . Migraines     Past Surgical History:  Procedure Laterality Date  . BREAST CYST EXCISION Left    2009 no visible scar on skin  . CESAREAN SECTION    . COLONOSCOPY WITH PROPOFOL N/A 12/25/2019   Procedure: COLONOSCOPY WITH PROPOFOL;  Surgeon: Rogene Houston, MD;  Location: [facility];  Service: Endoscopy;  Laterality: N/A;  730  . EXTRACORPOREAL SHOCK WAVE LITHOTRIPSY Left 10/10/2017   Procedure: LEFT EXTRACORPOREAL SHOCK WAVE LITHOTRIPSY (ESWL);  Surgeon: Bjorn Loser, MD;  Location: [facility];  Service: Urology;  Laterality: Left;  . EYE SURGERY    . HEMORRHOID SURGERY N/A 11/19/2012   Procedure: HEMORRHOIDECTOMY;  Surgeon: Jamesetta So, MD;  Location: [facility];  Service: General;  Laterality: N/A;  . kidney stones  1998  . LAPAROSCOPIC UNILATERAL SALPINGO OOPHERECTOMY  05/14/2012   Procedure: LAPAROSCOPIC UNILATERAL SALPINGO OOPHORECTOMY;  Surgeon: [name], MD;  Location: [facility];  Service: Gynecology;  Laterality: Right;  laparoscopic right salpingo-oophorectomy  .  PARTIAL HYSTERECTOMY    . POLYPECTOMY  12/25/2019   Procedure: POLYPECTOMY;  Surgeon: Rogene Houston, MD;  Location: [facility];  Service: Endoscopy;;  . TONSILECTOMY, ADENOIDECTOMY, BILATERAL MYRINGOTOMY AND TUBES    . VESICOVAGINAL FISTULA CLOSURE W/ TAH      There were no vitals filed for this visit.   Subjective Assessment - 01/25/20 1055    Subjective "I see the neurologist tomorrow."    Patient is accompained by: Family member    Currently in Pain? No/denies             ADULT SLP TREATMENT - 01/25/20 1101      General Information   Behavior/Cognition Alert;Cooperative;Pleasant mood    Patient Positioning Upright in chair    Oral care provided N/A    HPI Pt admitted on 12/26/19 for stroke. TPA note given, outside of time window. MRI 12/27/19 acute ischemic nonhemorrhagic left MCA territory infarct involving the left parietal lobe, few scattered punctate acute ischemic nonhemorrhagic infarcts bilateral frontal and parietal lobes as wellas the left cerebellum, small remote right cerebellar infarcts. PMH Essential hypertension,  obesity, Multiple myeloma ([place]), DM, Anemia, COVID. Pt is referred for outpatient SLP evaluation and treatment for aphasia from acute CVA.      Treatment Provided   Treatment provided Cognitive-Linquistic  Pain Assessment   Pain Assessment No/denies pain      Cognitive-Linquistic Treatment   Treatment focused on Aphasia;Patient/family/caregiver education    Skilled Treatment Answering Wh-questions, use of communication support page, implementing word finding and oral reading strategies, use of orthographic cues, patient centered approach, reading comprehension, self evaluation, and oral reading tasks      Assessment / Recommendations / [place] with current plan of care;Goals updated      Progression Toward Goals   Progression toward goals Goals met and updated              SLP Short Term Goals - 01/25/20 1108      SLP  SHORT TERM GOAL #1   Title Pt will implement word-finding strategies with 80% accuracy when unable to verbalize desired word in conversation/functional tasks with mod assist.    Baseline ~60%    Time 4    Period Weeks    Status Achieved    Target Date 01/29/20      SLP SHORT TERM GOAL #2   Title Pt will describe objects and pictures by providing at least three salient features as judged by clinician with 90% acc when provided min cues.    Baseline 66%    Time 4    Period Weeks    Status Achieved    Target Date 01/29/20      SLP SHORT TERM GOAL #3   Title Pt will increase divergent naming to 7+ items per concrete category with min cues.    Baseline 5    Time 4    Period Weeks    Status Achieved    Target Date 01/29/20      SLP SHORT TERM GOAL #4   Title Pt will complete basic sentence level reading comprehension tasks with 80% acc and min cues for error awareness.    Baseline 65% sentence level with multiple choice response    Time 4    Period Weeks    Status Achieved    Target Date 01/29/20      SLP SHORT TERM GOAL #5   Title Pt will be able to write personal/bio information with 90% acc when provided mod cues    Baseline Able to copy name only    Time 4    Period Weeks    Status Achieved    Target Date 01/29/20      SLP SHORT TERM GOAL #6   Title Pt will respond to open-ended questions using a complete sentence with 80% acc and min cues.    Baseline telegraphic responses    Time 4    Period Weeks    Status Partially Met    Target Date 02/29/20      SLP SHORT TERM GOAL #7   Title Pt will implement word-finding strategies with 90% accuracy when unable to verbalize desired word in conversation/functional tasks with min assist.    Baseline 80% mi/mod assist    Time 4    Period Weeks    Status New    Target Date 03/01/20      SLP SHORT TERM GOAL #8   Title Pt will describe objects and pictures without naming by providing at least 4 salient features as judged by  clinician with 90% acc when provided indirect cues.    Baseline 80%    Time 4    Period Weeks    Status New    Target Date 03/01/20      SLP  SHORT TERM GOAL  #9   TITLE Pt will complete moderately complex sentence and paragraph reading comprehension tasks with 90% acc with min assist.    Baseline sentence 80%    Time 4    Period Weeks    Status New    Target Date 03/01/20            SLP Long Term Goals - 01/25/20 1139      SLP LONG TERM GOAL #1   Title Pt will communicate moderately complex thoughts and feelings to [facility] with use of strategies as needed.    Baseline mod assist    Time 8    Period Weeks    Status On-going            Plan - 01/25/20 1107    Clinical Impression Statement Pt accompanied to therapy by her husband today. She completed HEP with 95% acc. Pt met 4 out of 5 goals in the past 4 weeks so goals have been updated above and 4 additional weeks of therapy recommended. Pt completed object description task by providing salient features with 100% acc with mi/mod cues which is much improved from two weeks ago. She continues to struggle with verbal fluency during open ended responses to questions and benefited from written and rehearsal cues.  Additional HEP provided.  Continue plan of care and goals have been updated. Plan for 4 more weeks of SLP therapy. Pt and spouse in agreement with plan of care.    Speech Therapy Frequency 2x / week    Duration 4 weeks   8 weeks   Treatment/Interventions SLP instruction and feedback;Internal/external aids;Compensatory strategies;Compensatory techniques;Patient/family education;Language facilitation;Cueing hierarchy;Multimodal communcation approach    Potential to Achieve Goals Good    SLP Home Exercise Plan Pt will complete HEP as assigned to facilitate carryover of treatment strategies and techniques in home environment with assist from spouse as needed.    Consulted and Agree with Plan of Care Patient;Family member/caregiver      Family Member Consulted Husband, [name]           Patient will benefit from skilled therapeutic intervention in order to improve the following deficits and impairments:   Cognitive communication deficit  Aphasia    Problem List Patient Active Problem List   Diagnosis Date Noted  . 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 H/o COVID-19--- was Positive 09/17/2019, Negative 12/23/19 AND also Neg on  12/26/19 12/26/2019  . Acute bronchiolitis due to respiratory syncytial virus (RSV) 08/11/2018  . Hypomagnesemia 08/10/2018  . Hyperlipidemia 08/09/2018  . Anemia 08/09/2018  . Leukopenia 08/09/2018  . Hypokalemia 08/09/2018  . Hypocalcemia 08/09/2018  . Lactic acidosis 08/09/2018  . Sepsis ([place]) 04/25/2018  . Goals of care, counseling/discussion 11/01/2017  . Multiple myeloma ([place]) 10/29/2017  . Hypercalcemia 10/29/2017  . Type 2 diabetes mellitus without complication, without long-term current use of insulin ([place]) 01/27/2016  . Morbid obesity due to excess calories ([place]) 01/27/2016  . Anxiety as acute reaction to exceptional stress 01/13/2015  . Attention deficit hyperactivity disorder (ADHD), 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  . Gout 01/20/2014  . GERD (gastroesophageal reflux disease) 01/20/2014  . Essential hypertension, benign 01/20/2014  . Plantar fasciitis of left foot 09/19/2011   Thank you,  Genene Churn, [place]  [facility] 01/25/2020, 11:40 AM  [place], [place], [address] Phone: [phone]   Fax:  [phone]   Name: Suad  GENECIS VELEY MRN: [id number] Date of Birth: [birth year]

## 2020-01-26 ENCOUNTER — Ambulatory Visit: Payer: BC Managed Care – PPO | Admitting: Adult Health

## 2020-01-26 ENCOUNTER — Encounter: Payer: Self-pay | Admitting: Adult Health

## 2020-01-26 VITALS — BP 118/72 | HR 71 | Ht 64.0 in | Wt 250.0 lb

## 2020-01-26 DIAGNOSIS — E119 Type 2 diabetes mellitus without complications: Secondary | ICD-10-CM | POA: Diagnosis not present

## 2020-01-26 DIAGNOSIS — I63412 Cerebral infarction due to embolism of left middle cerebral artery: Secondary | ICD-10-CM | POA: Diagnosis not present

## 2020-01-26 DIAGNOSIS — I10 Essential (primary) hypertension: Secondary | ICD-10-CM | POA: Diagnosis not present

## 2020-01-26 DIAGNOSIS — E785 Hyperlipidemia, unspecified: Secondary | ICD-10-CM | POA: Diagnosis not present

## 2020-01-26 DIAGNOSIS — Z5111 Encounter for antineoplastic chemotherapy: Secondary | ICD-10-CM

## 2020-01-26 MED ORDER — PANTOPRAZOLE SODIUM 40 MG PO TBEC: 40.0000 mg | DELAYED_RELEASE_TABLET | Freq: Every day | ORAL | 0 refills | Status: DC

## 2020-01-26 NOTE — Patient Instructions (Signed)
Continue working with outpatient speech therapy for ongoing improvement of your speech  Continue aspirin 81 mg daily and clopidogrel 75 mg daily  and Crestor 10mg   for secondary stroke prevention  Continue to follow up with PCP/endocrinology/cardiology regarding cholesterol, diabetes and blood pressure management   Complete 30 day cardiac event monitor for possible atrial fibrillation   Continue to monitor blood pressure at home  Maintain strict control of hypertension with blood pressure goal below 130/90, diabetes with hemoglobin A1c goal below 6.5% and cholesterol with LDL cholesterol (bad cholesterol) goal below 70 mg/dL. I also advised the patient to eat a healthy diet with plenty of whole grains, cereals, fruits and vegetables, exercise regularly and maintain ideal body weight.  Followup in the future with me in 3 months or call earlier if needed       Thank you for coming to see [name] at [facility]. I hope we have been able to provide you high quality care today.  You may receive a patient satisfaction survey over the next few weeks. We would appreciate your feedback and comments so that we may continue to improve ourselves and the health of our patients.

## 2020-01-26 NOTE — Progress Notes (Signed)
[facility] [address] [place]. [place] [address] [phone]       HOSPITAL FOLLOW UP NOTE  [name] Date of Birth:  1965/08/07 Medical Record Number:  [id number]   Reason for Referral:  hospital stroke follow up    SUBJECTIVE:   CHIEF COMPLAINT:  Chief Complaint  Patient presents with  . Hospitalization Follow-up    Recently discharged from hospital due to having a stroke. Is still having issues with speech.   . room 43    with husband    HPI:   Joanna Reid is a 54 y.o. female with history of multiple myeloma (s/p stem cell transplant and currently on maintenance Velcade and Revlimid), Covid positive in March, morbid obesity, DM, HTN, migraines and smoking s/p screening colonoscopy on 12/25/2019. She developed confusion and expressive aphasia on Saturday (12/26/2019).  Eventually transferred to [facility] for further work-up.  Stroke work-up revealed embolic left MCA parietal branch stroke as well scattered punctate bilateral frontal and parietal and left cerebellar infarcts.  CTA no significant large vessel stenosis or occlusion.  Currently on chemotherapy with thrombocytopenia therefore dual platelet not recommended and recommended continuation of aspirin 81 mg.  HTN stable with long-term BP goal normotensive range.  LDL 33 and increased Crestor to 14m daily.  History of DM with A1c 5.7.  Other stroke risk factors include former tobacco use, obesity and migraines.  Other active problems include leukopenia, anemia, thrombocytopenia, hypokalemia, UTI and Covid positive 09/2019.  Advised to ensure follow-up with oncology with chemotherapy possibly placed on hold for a few months during recovery.  No further evaluation including assessment for dysrhythmias and need for anticoagulation due to thrombocytopenia recommended considering in the future with possible 30-day cardiac event monitor.  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   Resultant mild  word finding difficulties/mild aphasia.  Code Stroke CT Head - Normal CT appearance of the brain. ASPECTS is 10/10.     CT head - not ordered  MRI head -small left parietal MCA branch infarct.  Tiny scattered punctate bilateral frontal and parietal and left cerebellar infarcts   MRA head - not ordered  CTA H&N -  No emergent large vessel occlusion. Mild narrowing of the mid basilar artery without other significant proximal stenosis, aneurysm, or branch vessel occlusion. Moderate distal small vessel disease in both the anterior and posterior circulation. Moderate tortuosity of the cervical internal carotid arteries bilaterally, likely secondary to hypertension. Atherosclerotic changes at the left carotid bifurcation and cavernous internal carotid arteries bilaterally without significant stenosis.  CT Perfusion - 24 mL left parietal lobe watershed region area of ischemia without defined core infarct.   Carotid Doppler - CTA neck performed - carotid dopplers not indicated.  2D Echo - EF 60 - 65%. No cardiac source of emboli identified.   Sars Corona Virus 2 - negative  LDL - 107  HgbA1c - 5.7  UDS - benzodiazepine  VTE prophylaxis -  Heparin  aspirin 81 mg daily prior to admission, now on aspirin 81 mg daily    Patient counseled to be compliant with her antithrombotic medications  Ongoing aggressive stroke risk factor management  Therapy recommendations: OP SLP  Disposition: home    Today, 01/26/2020, Joanna Reid being seen for hospital follow-up accompanied by her husband.  Residual deficits of expressive aphasia and reading difficulty with ongoing improvement working with outpatient SLP.  [facility] and will be completed at the end  of the week.  Apparently was discharged on aspirin 81 mg daily and clopidogrel 75 mg daily and denies bleeding or bruising.  Continues on Crestor 10 mg daily without myalgias.  Blood pressure  today 118/72.  Recent change to antihypertensive regimen by cardiology due to continued elevated blood pressures.  Routinely monitors at home.  Continues to follow with endocrinology for diabetic management.  Chemotherapy remains on hold and questioning duration of restarting.  Plans on repeat lab work next week.  No further concerns at this time.     ROS:   14 system review of systems performed and negative with exception of speech difficulty  PMH:  Past Medical History:  Diagnosis Date  . Acid reflux   . Allergic rhinitis   . Cancer ([place])    multiple myeloma  . Diabetes mellitus    type 2  . Gout   . Gout   . HBP (high blood pressure)   . History of kidney stones   . Migraines     PSH:  Past Surgical History:  Procedure Laterality Date  . BREAST CYST EXCISION Left    2009 no visible scar on skin  . CESAREAN SECTION    . COLONOSCOPY WITH PROPOFOL N/A 12/25/2019   Procedure: COLONOSCOPY WITH PROPOFOL;  Surgeon: Rogene Houston, MD;  Location: [facility];  Service: Endoscopy;  Laterality: N/A;  730  . EXTRACORPOREAL SHOCK WAVE LITHOTRIPSY Left 10/10/2017   Procedure: LEFT EXTRACORPOREAL SHOCK WAVE LITHOTRIPSY (ESWL);  Surgeon: Bjorn Loser, MD;  Location: [facility];  Service: Urology;  Laterality: Left;  . EYE SURGERY    . HEMORRHOID SURGERY N/A 11/19/2012   Procedure: HEMORRHOIDECTOMY;  Surgeon: Jamesetta So, MD;  Location: [facility] OR[facility];  Service: General;  Laterality: N/A;  . kidney stones  1998  . LAPAROSCOPIC UNILATERAL SALPINGO OOPHERECTOMY  05/14/2012   Procedure: LAPAROSCOPIC UNILATERAL SALPINGO OOPHORECTOMY;  Surgeon: [name], MD;  Location: [facility] OR[facility];  Service: Gynecology;  Laterality: Right;  laparoscopic right salpingo-oophorectomy  . PARTIAL HYSTERECTOMY    . POLYPECTOMY  12/25/2019   Procedure: POLYPECTOMY;  Surgeon: Rogene Houston, MD;  Location: [facility];  Service: Endoscopy;;  . TONSILECTOMY, ADENOIDECTOMY, BILATERAL MYRINGOTOMY AND TUBES    .  VESICOVAGINAL FISTULA CLOSURE W/ TAH      Social History:  Social History   Socioeconomic History  . Marital status: Married    Spouse name: Not on file  . Number of children: Not on file  . Years of education: 30  . Highest education level: Not on file  Occupational History    Employer: [employer]  Tobacco Use  . Smoking status: Former Smoker    Quit date: 02/27/1999    Years since quitting: 20.9  . Smokeless tobacco: Never Used  . Tobacco comment: socially   Vaping Use  . Vaping Use: Never used  Substance and Sexual Activity  . Alcohol use: No    Alcohol/week: 0.0 standard drinks  . Drug use: No  . Sexual activity: Yes  Other Topics Concern  . Not on file  Social History Narrative  . Not on file   Social Determinants of Health   Financial Resource Strain: Low Risk   . Difficulty of Paying Living Expenses: Not hard at all  Food Insecurity: No Food Insecurity  . Worried About Charity fundraiser in the Last Year: Never true  . Ran Out of Food in the Last Year: Never true  Transportation Needs: No Transportation Needs  .  Lack of Transportation (Medical): No  . Lack of Transportation (Non-Medical): No  Physical Activity: Insufficiently Active  . Days of Exercise per Week: 7 days  . Minutes of Exercise per Session: 10 min  Stress:   . Feeling of Stress :   Social Connections: Socially Integrated  . Frequency of Communication with Friends and Family: More than three times a week  . Frequency of Social Gatherings with Friends and Family: More than three times a week  . Attends Religious Services: More than 4 times per year  . Active Member of Clubs or Organizations: Yes  . Attends Archivist Meetings: More than 4 times per year  . Marital Status: Married  Human resources officer Violence: Not At Risk  . Fear of Current or Ex-Partner: No  . Emotionally Abused: No  . Physically Abused: No  . Sexually Abused: No    Family History:  Family History  Problem Relation  Age of Onset  . Arthritis Other   . Cancer Other   . Diabetes Other   . Hypertension Mother   . Dementia Mother   . Diabetes Father   . ALS Father   . Diabetes Brother   . Hypertension Brother   . Cancer Paternal Aunt   . COPD Maternal Grandmother   . Cancer Maternal Grandfather   . Anesthesia problems Paternal Grandfather     Medications:   Current Outpatient Medications on File Prior to Visit  Medication Sig Dispense Refill  . acyclovir (ZOVIRAX) 400 MG tablet TAKE (1) TABLET BY MOUTH TWICE DAILY. 60 tablet 6  . albuterol (PROVENTIL HFA;VENTOLIN HFA) 108 (90 Base) MCG/ACT inhaler Inhale 2 puffs into the lungs every 4 (four) hours as needed for wheezing or shortness of breath. 1 Inhaler 3  . allopurinol (ZYLOPRIM) 300 MG tablet TAKE 1 TABLET DAILY (Patient taking differently: Take 300 mg by mouth daily. ) 90 tablet 1  . aspirin EC 81 MG tablet Take 1 tablet (81 mg total) by mouth daily. 30 tablet 11  . bortezomib SQ (VELCADE) 3.5 MG SOLR Inject 1.2 mLs (3 mg total) into the skin every 14 (fourteen) days for 6 doses. 7.2 mL 12  . bumetanide (BUMEX) 0.5 MG tablet Take 0.5 mg by mouth daily.     . carvedilol (COREG) 25 MG tablet Take 1 tablet (25 mg total) by mouth 2 (two) times daily.    . clopidogrel (PLAVIX) 75 MG tablet Take 75 mg by mouth daily.     . diazepam (VALIUM) 5 MG tablet Take 1 tablet (5 mg total) by mouth at bedtime. 30 tablet 5  . Docosanol (ABREVA) 10 % CREA Apply 1 application topically daily as needed (for nose).     Marland Kitchen docusate sodium (COLACE) 100 MG capsule Take 100 mg by mouth daily.     . fluconazole (DIFLUCAN) 150 MG tablet Take 150 mg by mouth every 3 (three) days.    . fluticasone (FLONASE) 50 MCG/ACT nasal spray Place 2 sprays into both nostrils daily. 16 g 0  . lenalidomide (REVLIMID) 10 MG capsule TAKE 1 CAPSULE BY MOUTH  DAILY FOR 21 DAYS ON, THEN  7 DAYS OFF 21 capsule 1  . losartan (COZAAR) 25 MG tablet Take 1 tablet (25 mg total) by mouth in the morning  and at bedtime.    . Magnesium 200 MG TABS Take 600 mg by mouth daily.    . magnesium oxide (MAG-OX) 400 (241.3 Mg) MG tablet Take 1 tablet (400 mg total) by mouth 3 (  three) times daily. 90 tablet 3  . metFORMIN (GLUCOPHAGE) 500 MG tablet TAKE 1 TABLET BY MOUTH ONCE DAILY WITH BREAKFAST (Patient taking differently: Take 1,000 mg by mouth 2 (two) times daily with a meal. ) 90 tablet 0  . pantoprazole (PROTONIX) 40 MG tablet Take 1 tablet (40 mg total) by mouth daily. 30 tablet 0  . pregabalin (LYRICA) 200 MG capsule TAKE (1) CAPSULE BY MOUTH TWICE DAILY. (Patient taking differently: Take 200 mg by mouth daily. ) 60 capsule 4  . prochlorperazine (COMPAZINE) 10 MG tablet every 14 (fourteen) days    . Propylene Glycol (SYSTANE BALANCE) 0.6 % SOLN Apply 1 drop to eye daily as needed (dry eye).     . rosuvastatin (CRESTOR) 10 MG tablet Take 1 tablet (10 mg total) by mouth daily. 30 tablet 0  . TRULICITY 1.5 UJ/8.1XB SOPN Inject 0.75 mg into the skin every Monday.     . valACYclovir (VALTREX) 1000 MG tablet TAKE 2 TABLETS NOW, THEN 2 TABLETS 12 HOURS LATER. 12 tablet 0   No current facility-administered medications on file prior to visit.    Allergies:  No Known Allergies    OBJECTIVE:  Physical Exam  Vitals:   01/26/20 1359  BP: 118/72  Pulse: 71  Weight: 250 lb (113.4 kg)  Height: 5' 4"  (1.626 m)   Body mass index is 42.91 kg/m. No exam data present   General: well developed, well nourished,  very pleasant middle-age Caucasian female, seated, in no evident distress Head: head normocephalic and atraumatic.   Neck: supple with no carotid or supraclavicular bruits Cardiovascular: regular rate and rhythm, no murmurs Musculoskeletal: no deformity Skin:  no rash/petichiae Vascular:  Normal pulses all extremities   Neurologic Exam Mental Status: Awake and fully alert.   Mild to moderate expressive aphasia with speech hesitancy.  Oriented to place and time. Recent and remote memory  intact. Attention span, concentration and fund of knowledge appropriate. Mood and affect appropriate.  Cranial Nerves: Fundoscopic exam reveals sharp disc margins. Pupils equal, briskly reactive to light. Extraocular movements full without nystagmus. Visual fields full to confrontation. Hearing intact. Facial sensation intact. Face, tongue, palate moves normally and symmetrically.  Motor: Normal bulk and tone. Normal strength in all tested extremity muscles. Sensory.: intact to touch , pinprick , position and vibratory sensation.  Coordination: Rapid alternating movements normal in all extremities. Finger-to-nose and heel-to-shin performed accurately bilaterally. Gait and Station: Arises from chair without difficulty. Stance is normal. Gait demonstrates normal stride length and balance Reflexes: 1+ and symmetric. Toes downgoing.     NIHSS  1 Modified Rankin  2      ASSESSMENT: Joanna Reid is a 54 y.o. year old female presented with confusion and expressive aphasia on 12/28/2019 after screening colonoscopy and holding of aspirin with stroke work-up revealing embolic left MCA parietal branch infarct secondary to unknown source.  Currently receiving chemotherapy for multiple myeloma and thrombocytopenia during admission.  Vascular risk factors include multiple myeloma on chemotherapy, thrombocytopenia, HTN, HLD, DM and migraines.      PLAN:  1. Left MCA stroke:  -Residual deficits: Expressive aphasia.  Reports ongoing improvement and encouraged continued participation with outpatient speech therapy for likely ongoing improvement -Continue aspirin 81 mg daily and clopidogrel 75 mg daily  and Crestor 10 mg daily for secondary stroke prevention.  Advised to continue DAPT and will speak further with Dr. Leonie Man in regards to recommended duration of DAPT from a stroke standpoint.   -Complete 30-day cardiac event  monitor to assess for atrial fibrillation -Ensure close PCP follow-up for  aggressive stroke risk factor management including HTN, HLD and DM 2. HTN: BP goal<130/90.  Stable today.  Continue to follow with cardiology for monitoring management 3. HLD: LDL<70.  Recent LDL 107.  Continue to increase Crestor dose and ensure close PCP follow-up for prescribing, monitoring management 4. DMII: A1c<7.  Recent A1c 5.7.  Continue to follow with endocrinology for monitoring management 5. Multiple myeloma: maintenance Velcade and Revlimid currently on hold as recommended at discharge.  Advise further discussion with Dr. Leonie Man in regards to recommendations on restarting and timeframe.  Continue to follow with oncology with repeat lab work for monitoring of thrombocytopenia   Follow up in 3 months or call earlier if needed   I spent 50 minutes of face-to-face and non-face-to-face time with patient and husband.  This included previsit chart review, lab review, study review, order entry, electronic health record documentation, patient education regarding recent stroke, residual deficits, importance of managing stroke risk factors and answered all questions to patient satisfaction   Frann Rider, [facility]  [facility] [address] [place], [place] [address]  Phone [phone] Fax [phone] Note: This document was prepared with digital dictation and possible smart phrase technology. Any transcriptional errors that result from this process are unintentional.

## 2020-01-27 ENCOUNTER — Other Ambulatory Visit: Payer: Self-pay

## 2020-01-27 ENCOUNTER — Encounter (HOSPITAL_COMMUNITY): Payer: Self-pay | Admitting: Speech Pathology

## 2020-01-27 ENCOUNTER — Ambulatory Visit (HOSPITAL_COMMUNITY): Payer: BC Managed Care – PPO | Admitting: Speech Pathology

## 2020-01-27 DIAGNOSIS — R41841 Cognitive communication deficit: Secondary | ICD-10-CM

## 2020-01-27 DIAGNOSIS — R278 Other lack of coordination: Secondary | ICD-10-CM | POA: Diagnosis not present

## 2020-01-27 DIAGNOSIS — R4701 Aphasia: Secondary | ICD-10-CM

## 2020-01-27 NOTE — Therapy (Signed)
[place], [place], [address] Phone: [phone]   Fax:  [phone]  Speech Language Pathology Treatment  Patient Details  Name: Joanna Reid MRN: [id number] Date of Birth: 1966/03/26 Referring Provider (SLP): Lala Lund, MD   Encounter Date: 01/27/2020   End of Session - 01/27/20 1047    Visit Number 9    Number of Visits 16    Date for SLP Re-Evaluation 03/01/20    Authorization Type [employer] Comm PPO   $30 copay, 60 combined visit limit, OOP max $5350   SLP Start Time 1033    SLP Stop Time  1118    SLP Time Calculation (min) 45 min    Activity Tolerance Patient tolerated treatment well           Past Medical History:  Diagnosis Date  . Acid reflux   . Allergic rhinitis   . Cancer ([place])    multiple myeloma  . Diabetes mellitus    type 2  . Gout   . Gout   . HBP (high blood pressure)   . History of kidney stones   . Migraines     Past Surgical History:  Procedure Laterality Date  . BREAST CYST EXCISION Left    2009 no visible scar on skin  . CESAREAN SECTION    . COLONOSCOPY WITH PROPOFOL N/A 12/25/2019   Procedure: COLONOSCOPY WITH PROPOFOL;  Surgeon: Rogene Houston, MD;  [facility];  Service: Endoscopy;  Laterality: N/A;  730  . EXTRACORPOREAL SHOCK WAVE LITHOTRIPSY Left 10/10/2017   Procedure: LEFT EXTRACORPOREAL SHOCK WAVE LITHOTRIPSY (ESWL);  Surgeon: Bjorn Loser, MD;  Location: [facility];  Service: Urology;  Laterality: Left;  . EYE SURGERY    . HEMORRHOID SURGERY N/A 11/19/2012   Procedure: HEMORRHOIDECTOMY;  Surgeon: Jamesetta So, MD;  Location: [facility];  Service: General;  Laterality: N/A;  . kidney stones  1998  . LAPAROSCOPIC UNILATERAL SALPINGO OOPHERECTOMY  05/14/2012   Procedure: LAPAROSCOPIC UNILATERAL SALPINGO OOPHORECTOMY;  Surgeon: [name], MD;  Location: [facility];  Service: Gynecology;  Laterality: Right;  laparoscopic right salpingo-oophorectomy  .  PARTIAL HYSTERECTOMY    . POLYPECTOMY  12/25/2019   Procedure: POLYPECTOMY;  Surgeon: Rogene Houston, MD;  [facility];  Service: Endoscopy;;  . TONSILECTOMY, ADENOIDECTOMY, BILATERAL MYRINGOTOMY AND TUBES    . VESICOVAGINAL FISTULA CLOSURE W/ TAH      There were no vitals filed for this visit.   Subjective Assessment - 01/27/20 1040    Subjective "I saw the neurologist yesterday."    Patient is accompained by: Family member    Currently in Pain? No/denies            01/27/20 1054  General Information  Behavior/Cognition Alert;Cooperative;Pleasant mood  Patient Positioning Upright in chair  Oral care provided N/A  HPI Pt admitted on 12/26/19 for stroke. TPA note given, outside of time window. MRI 12/27/19 acute ischemic nonhemorrhagic left MCA territory infarct involving the left parietal lobe, few scattered punctate acute ischemic nonhemorrhagic infarcts bilateral frontal and parietal lobes as wellas the left cerebellum, small remote right cerebellar infarcts. PMH Essential hypertension,  obesity, Multiple myeloma ([place]), DM, Anemia, COVID. Pt is referred for outpatient SLP evaluation and treatment for aphasia from acute CVA.  Treatment Provided  Treatment provided Cognitive-Linquistic  Pain Assessment  Pain Assessment No/denies pain  Cognitive-Linquistic Treatment  Treatment focused on Aphasia;Patient/family/caregiver education  Skilled Treatment Answering Wh-questions, use of communication  support page, implementing word finding and oral reading strategies, use of orthographic cues, patient centered approach, reading comprehension, self evaluation, and oral reading tasks  Assessment / Recommendations / [place] with current plan of care;Goals updated  Progression Toward Goals  Progression toward goals Progressing toward goals      SLP Short Term Goals - 01/27/20 1050      SLP SHORT TERM GOAL #1   Title Pt will implement word-finding strategies with 80%  accuracy when unable to verbalize desired word in conversation/functional tasks with mod assist.    Baseline ~60%    Time 4    Period Weeks    Status Achieved    Target Date 01/29/20      SLP SHORT TERM GOAL #2   Title Pt will describe objects and pictures by providing at least three salient features as judged by clinician with 90% acc when provided min cues.    Baseline 66%    Time 4    Period Weeks    Status Achieved    Target Date 01/29/20      SLP SHORT TERM GOAL #3   Title Pt will increase divergent naming to 7+ items per concrete category with min cues.    Baseline 5    Time 4    Period Weeks    Status Achieved    Target Date 01/29/20      SLP SHORT TERM GOAL #4   Title Pt will complete basic sentence level reading comprehension tasks with 80% acc and min cues for error awareness.    Baseline 65% sentence level with multiple choice response    Time 4    Period Weeks    Status Achieved    Target Date 01/29/20      SLP SHORT TERM GOAL #5   Title Pt will be able to write personal/bio information with 90% acc when provided mod cues    Baseline Able to copy name only    Time 4    Period Weeks    Status Achieved    Target Date 01/29/20      SLP SHORT TERM GOAL #6   Title Pt will respond to open-ended questions using a complete sentence with 80% acc and min cues.    Baseline telegraphic responses    Time 4    Period Weeks    Status On-going    Target Date 02/29/20      SLP SHORT TERM GOAL #7   Title Pt will implement word-finding strategies with 90% accuracy when unable to verbalize desired word in conversation/functional tasks with min assist.    Baseline 80% mi/mod assist    Time 4    Period Weeks    Status On-going    Target Date 03/01/20      SLP SHORT TERM GOAL #8   Title Pt will describe objects and pictures without naming by providing at least 4 salient features as judged by clinician with 90% acc when provided indirect cues.    Baseline 80%    Time 4      Period Weeks    Status On-going    Target Date 03/01/20      SLP SHORT TERM GOAL  #9   TITLE Pt will complete moderately complex sentence and paragraph reading comprehension tasks with 90% acc with min assist.    Baseline sentence 80%    Time 4    Period Weeks    Status On-going    Target Date  03/01/20            SLP Long Term Goals - 01/27/20 1052      SLP LONG TERM GOAL #1   Title Pt will communicate moderately complex thoughts and feelings to [facility] with use of strategies as needed.    Baseline mod assist    Time 8    Period Weeks    Status On-going            Plan - 01/27/20 1049    Clinical Impression Statement Pt accompanied to therapy by her husband today. She completed oral reading of single words with 100% acc independently, oral reading of sentences with 90% acc and allowance for self corrections, naming to description task with 100% acc, and providing verbal description of selected words with 100% acc. Verbal fluency at the sentence and conversation level continues to be reduced and with hesitations and difficulty pronouncing multisyllabic words. She acknowledges that "taking time" helps. She was given additional HEP. Continue with plan of care.     Speech Therapy Frequency 2x / week    Duration 4 weeks   8 weeks   Treatment/Interventions SLP instruction and feedback;Internal/external aids;Compensatory strategies;Compensatory techniques;Patient/family education;Language facilitation;Cueing hierarchy;Multimodal communcation approach    Potential to Achieve Goals Good    SLP Home Exercise Plan Pt will complete HEP as assigned to facilitate carryover of treatment strategies and techniques in home environment with assist from spouse as needed.    Consulted and Agree with Plan of Care Patient;Family member/caregiver    Family Member Consulted Husband, [name]           Patient will benefit from skilled therapeutic intervention in order to improve the following deficits  and impairments:   Aphasia  Cognitive communication deficit    Problem List Patient Active Problem List   Diagnosis Date Noted  . 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 H/o COVID-19--- was Positive 09/17/2019, Negative 12/23/19 AND also Neg on  12/26/19 12/26/2019  . Acute bronchiolitis due to respiratory syncytial virus (RSV) 08/11/2018  . Hypomagnesemia 08/10/2018  . Hyperlipidemia 08/09/2018  . Anemia 08/09/2018  . Leukopenia 08/09/2018  . Hypokalemia 08/09/2018  . Hypocalcemia 08/09/2018  . Lactic acidosis 08/09/2018  . Sepsis ([place]) 04/25/2018  . Goals of care, counseling/discussion 11/01/2017  . Multiple myeloma ([place]) 10/29/2017  . Hypercalcemia 10/29/2017  . Type 2 diabetes mellitus without complication, without long-term current use of insulin ([place]) 01/27/2016  . Morbid obesity due to excess calories ([place]) 01/27/2016  . Anxiety as acute reaction to exceptional stress 01/13/2015  . Attention deficit hyperactivity disorder (ADHD), 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  . Gout 01/20/2014  . GERD (gastroesophageal reflux disease) 01/20/2014  . Essential hypertension, benign 01/20/2014  . Plantar fasciitis of left foot 09/19/2011   Thank you,  Genene Churn, [place]   [facility] 01/27/2020, 11:04 AM  [place] [address] [place], [place], [address] Phone: [phone]   Fax:  [phone]   Name: Joanna Reid MRN: [id number] Date of Birth: 30-Sep-1965

## 2020-01-28 ENCOUNTER — Encounter: Payer: Self-pay | Admitting: Adult Health

## 2020-02-01 ENCOUNTER — Ambulatory Visit (HOSPITAL_COMMUNITY): Payer: BC Managed Care – PPO | Admitting: Speech Pathology

## 2020-02-01 ENCOUNTER — Encounter (HOSPITAL_COMMUNITY): Payer: Self-pay | Admitting: Speech Pathology

## 2020-02-01 ENCOUNTER — Other Ambulatory Visit: Payer: Self-pay

## 2020-02-01 DIAGNOSIS — R278 Other lack of coordination: Secondary | ICD-10-CM | POA: Diagnosis not present

## 2020-02-01 DIAGNOSIS — R41841 Cognitive communication deficit: Secondary | ICD-10-CM

## 2020-02-01 DIAGNOSIS — R4701 Aphasia: Secondary | ICD-10-CM

## 2020-02-01 NOTE — Therapy (Signed)
[place], [place], [address] Phone: [phone]   Fax:  [phone]  Speech Language Pathology Treatment  Patient Details  Name: Joanna Reid MRN: [id number] Date of Birth: 11/19/1965 Referring Provider (SLP): Lala Lund, MD   Encounter Date: 02/01/2020   End of Session - 02/01/20 0958    Visit Number 10    Number of Visits 16    Date for SLP Re-Evaluation 03/01/20    Authorization Type [employer] Comm PPO   $30 copay, 60 combined visit limit, OOP max $5350   SLP Start Time [phone]    SLP Stop Time  1038    SLP Time Calculation (min) 46 min    Activity Tolerance Patient tolerated treatment well           Past Medical History:  Diagnosis Date  . Acid reflux   . Allergic rhinitis   . Cancer ([place])    multiple myeloma  . Diabetes mellitus    type 2  . Gout   . Gout   . HBP (high blood pressure)   . History of kidney stones   . Migraines     Past Surgical History:  Procedure Laterality Date  . BREAST CYST EXCISION Left    2009 no visible scar on skin  . CESAREAN SECTION    . COLONOSCOPY WITH PROPOFOL N/A 12/25/2019   Procedure: COLONOSCOPY WITH PROPOFOL;  Surgeon: Rogene Houston, MD;  [facility];  Service: Endoscopy;  Laterality: N/A;  730  . EXTRACORPOREAL SHOCK WAVE LITHOTRIPSY Left 10/10/2017   Procedure: LEFT EXTRACORPOREAL SHOCK WAVE LITHOTRIPSY (ESWL);  Surgeon: Bjorn Loser, MD;  [facility];  Service: Urology;  Laterality: Left;  . EYE SURGERY    . HEMORRHOID SURGERY N/A 11/19/2012   Procedure: HEMORRHOIDECTOMY;  Surgeon: Jamesetta So, MD;  Location: [facility];  Service: General;  Laterality: N/A;  . kidney stones  1998  . LAPAROSCOPIC UNILATERAL SALPINGO OOPHERECTOMY  05/14/2012   Procedure: LAPAROSCOPIC UNILATERAL SALPINGO OOPHORECTOMY;  Surgeon: [name], MD;  Location: [facility];  Service: Gynecology;  Laterality: Right;  laparoscopic right salpingo-oophorectomy  .  PARTIAL HYSTERECTOMY    . POLYPECTOMY  12/25/2019   Procedure: POLYPECTOMY;  Surgeon: Rogene Houston, MD;  [facility];  Service: Endoscopy;;  . TONSILECTOMY, ADENOIDECTOMY, BILATERAL MYRINGOTOMY AND TUBES    . VESICOVAGINAL FISTULA CLOSURE W/ TAH      There were no vitals filed for this visit.   Subjective Assessment - 02/01/20 0956    Subjective "I drove to the flea market."    Patient is accompained by: Family member    Currently in Pain? No/denies             ADULT SLP TREATMENT - 02/01/20 0957      General Information   Behavior/Cognition Alert;Cooperative;Pleasant mood    Patient Positioning Upright in chair    Oral care provided N/A    HPI Pt admitted on 12/26/19 for stroke. TPA note given, outside of time window. MRI 12/27/19 acute ischemic nonhemorrhagic left MCA territory infarct involving the left parietal lobe, few scattered punctate acute ischemic nonhemorrhagic infarcts bilateral frontal and parietal lobes as wellas the left cerebellum, small remote right cerebellar infarcts. PMH Essential hypertension,  obesity, Multiple myeloma ([place]), DM, Anemia, COVID. Pt is referred for outpatient SLP evaluation and treatment for aphasia from acute CVA.      Treatment Provided   Treatment provided Cognitive-Linquistic  Pain Assessment   Pain Assessment No/denies pain      Cognitive-Linquistic Treatment   Treatment focused on Aphasia;Patient/family/caregiver education    Skilled Treatment Answering Wh-questions, use of communication support page, implementing word finding and oral reading strategies, use of orthographic cues, patient centered approach, reading comprehension, self evaluation, and oral reading tasks      Assessment / Recommendations / [place] with current plan of care;Goals updated      Progression Toward Goals   Progression toward goals Progressing toward goals              SLP Short Term Goals - 02/01/20 0959      SLP  SHORT TERM GOAL #1   Title Pt will implement word-finding strategies with 80% accuracy when unable to verbalize desired word in conversation/functional tasks with mod assist.    Baseline ~60%    Time 4    Period Weeks    Status Achieved    Target Date 01/29/20      SLP SHORT TERM GOAL #2   Title Pt will describe objects and pictures by providing at least three salient features as judged by clinician with 90% acc when provided min cues.    Baseline 66%    Time 4    Period Weeks    Status Achieved    Target Date 01/29/20      SLP SHORT TERM GOAL #3   Title Pt will increase divergent naming to 7+ items per concrete category with min cues.    Baseline 5    Time 4    Period Weeks    Status Achieved    Target Date 01/29/20      SLP SHORT TERM GOAL #4   Title Pt will complete basic sentence level reading comprehension tasks with 80% acc and min cues for error awareness.    Baseline 65% sentence level with multiple choice response    Time 4    Period Weeks    Status Achieved    Target Date 01/29/20      SLP SHORT TERM GOAL #5   Title Pt will be able to write personal/bio information with 90% acc when provided mod cues    Baseline Able to copy name only    Time 4    Period Weeks    Status Achieved    Target Date 01/29/20      SLP SHORT TERM GOAL #6   Title Pt will respond to open-ended questions using a complete sentence with 80% acc and min cues.    Baseline telegraphic responses    Time 4    Period Weeks    Status On-going    Target Date 02/29/20      SLP SHORT TERM GOAL #7   Title Pt will implement word-finding strategies with 90% accuracy when unable to verbalize desired word in conversation/functional tasks with min assist.    Baseline 80% mi/mod assist    Time 4    Period Weeks    Status On-going    Target Date 03/01/20      SLP SHORT TERM GOAL #8   Title Pt will describe objects and pictures without naming by providing at least 4 salient features as judged by  clinician with 90% acc when provided indirect cues.    Baseline 80%    Time 4    Period Weeks    Status On-going    Target Date 03/01/20      SLP SHORT TERM  GOAL  #9   TITLE Pt will complete moderately complex sentence and paragraph reading comprehension tasks with 90% acc with min assist.    Baseline sentence 80%    Time 4    Period Weeks    Status On-going    Target Date 03/01/20            SLP Long Term Goals - 02/01/20 1000      SLP LONG TERM GOAL #1   Title Pt will communicate moderately complex thoughts and feelings to [facility] with use of strategies as needed.    Baseline mod assist    Time 8    Period Weeks    Status On-going            Plan - 02/01/20 0959    Clinical Impression Statement Pt accompanied to therapy by her friend today. Pt participated in Pt centered (high interest) conversation and was evaluated in terms of need for cues, use of strategies independently, SLP requests for clarification, and Pt's ability to self correct. She required min cues for incorporating/reminders to use strategies. She made attempts at self corrections 95% of the time and used word finding strategies two times in 30 minute conversation. SLP requested clarification 2x. She completed HEP and was given additional reading comprehension HEP. Continue with plan of care.    Speech Therapy Frequency 2x / week    Duration 4 weeks   8 weeks   Treatment/Interventions SLP instruction and feedback;Internal/external aids;Compensatory strategies;Compensatory techniques;Patient/family education;Language facilitation;Cueing hierarchy;Multimodal communcation approach    Potential to Achieve Goals Good    SLP Home Exercise Plan Pt will complete HEP as assigned to facilitate carryover of treatment strategies and techniques in home environment with assist from spouse as needed.    Consulted and Agree with Plan of Care Patient;Family member/caregiver    Family Member Consulted Husband, [name]            Patient will benefit from skilled therapeutic intervention in order to improve the following deficits and impairments:   Aphasia  Cognitive communication deficit    Problem List Patient Active Problem List   Diagnosis Date Noted  . 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 H/o COVID-19--- was Positive 09/17/2019, Negative 12/23/19 AND also Neg on  12/26/19 12/26/2019  . Acute bronchiolitis due to respiratory syncytial virus (RSV) 08/11/2018  . Hypomagnesemia 08/10/2018  . Hyperlipidemia 08/09/2018  . Anemia 08/09/2018  . Leukopenia 08/09/2018  . Hypokalemia 08/09/2018  . Hypocalcemia 08/09/2018  . Lactic acidosis 08/09/2018  . Sepsis ([place]) 04/25/2018  . Goals of care, counseling/discussion 11/01/2017  . Multiple myeloma ([place]) 10/29/2017  . Hypercalcemia 10/29/2017  . Type 2 diabetes mellitus without complication, without long-term current use of insulin ([place]) 01/27/2016  . Morbid obesity due to excess calories ([place]) 01/27/2016  . Anxiety as acute reaction to exceptional stress 01/13/2015  . Attention deficit hyperactivity disorder (ADHD), 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  . Gout 01/20/2014  . GERD (gastroesophageal reflux disease) 01/20/2014  . Essential hypertension, benign 01/20/2014  . Plantar fasciitis of left foot 09/19/2011   Thank you,  Genene Churn, [place]  [facility] 02/01/2020, 10:01 AM  [place] [address] [place], [place], [address] Phone: [phone]   Fax:  [phone]   Name: Joanna Reid MRN: [id number] Date of Birth: [birth year]

## 2020-02-02 ENCOUNTER — Ambulatory Visit (HOSPITAL_COMMUNITY): Payer: BC Managed Care – PPO

## 2020-02-02 ENCOUNTER — Other Ambulatory Visit: Payer: Self-pay | Admitting: *Deleted

## 2020-02-02 ENCOUNTER — Inpatient Hospital Stay (HOSPITAL_COMMUNITY): Payer: BC Managed Care – PPO

## 2020-02-02 ENCOUNTER — Other Ambulatory Visit (HOSPITAL_COMMUNITY): Payer: BC Managed Care – PPO

## 2020-02-02 ENCOUNTER — Other Ambulatory Visit: Payer: Self-pay

## 2020-02-02 DIAGNOSIS — C9000 Multiple myeloma not having achieved remission: Secondary | ICD-10-CM

## 2020-02-02 LAB — CBC WITH DIFFERENTIAL/PLATELET
Abs Immature Granulocytes: 0.01 10*3/uL (ref 0.00–0.07)
Basophils Absolute: 0 10*3/uL (ref 0.0–0.1)
Basophils Relative: 0 %
Eosinophils Absolute: 0.1 10*3/uL (ref 0.0–0.5)
Eosinophils Relative: 2 %
HCT: 34.9 % — ABNORMAL LOW (ref 36.0–46.0)
Hemoglobin: 11.3 g/dL — ABNORMAL LOW (ref 12.0–15.0)
Immature Granulocytes: 0 %
Lymphocytes Relative: 31 %
Lymphs Abs: 1 10*3/uL (ref 0.7–4.0)
MCH: 30.7 pg (ref 26.0–34.0)
MCHC: 32.4 g/dL (ref 30.0–36.0)
MCV: 94.8 fL (ref 80.0–100.0)
Monocytes Absolute: 0.4 10*3/uL (ref 0.1–1.0)
Monocytes Relative: 13 %
Neutro Abs: 1.8 10*3/uL (ref 1.7–7.7)
Neutrophils Relative %: 54 %
Platelets: 146 10*3/uL — ABNORMAL LOW (ref 150–400)
RBC: 3.68 MIL/uL — ABNORMAL LOW (ref 3.87–5.11)
RDW: 14.8 % (ref 11.5–15.5)
WBC: 3.3 10*3/uL — ABNORMAL LOW (ref 4.0–10.5)
nRBC: 0 % (ref 0.0–0.2)

## 2020-02-02 LAB — COMPREHENSIVE METABOLIC PANEL
ALT: 18 U/L (ref 0–44)
AST: 19 U/L (ref 15–41)
Albumin: 4 g/dL (ref 3.5–5.0)
Alkaline Phosphatase: 61 U/L (ref 38–126)
Anion gap: 9 (ref 5–15)
BUN: 24 mg/dL — ABNORMAL HIGH (ref 6–20)
CO2: 25 mmol/L (ref 22–32)
Calcium: 9.1 mg/dL (ref 8.9–10.3)
Chloride: 107 mmol/L (ref 98–111)
Creatinine, Ser: 1.03 mg/dL — ABNORMAL HIGH (ref 0.44–1.00)
GFR calc Af Amer: >60 mL/min (ref >60)
GFR calc non Af Amer: >60 mL/min (ref >60)
Glucose, Bld: 104 mg/dL — ABNORMAL HIGH (ref 70–99)
Potassium: 3.7 mmol/L (ref 3.5–5.1)
Sodium: 141 mmol/L (ref 135–145)
Total Bilirubin: 0.6 mg/dL (ref 0.3–1.2)
Total Protein: 6.8 g/dL (ref 6.5–8.1)

## 2020-02-02 LAB — LACTATE DEHYDROGENASE: LDH: 122 U/L (ref 98–192)

## 2020-02-02 NOTE — Patient Outreach (Signed)
[facility]) Care Management  02/02/2020  Joanna Reid 05-Oct-1965 [id number]   Subjective: Telephone call to patient's home  / mobile number, no answer, left HIPAA compliant voicemail message, and requested call back.    Objective: Per KPN (Knowledge Performance Now, point of care tool) and chart review, patient hospitalized 12/26/2019 - 12/28/2019 for Stroke (cerebrum), confusion, speech deficits primarily expressive aphasia , .   Patient also has a history of hypertension, diabetes, Multiple myeloma ( status post Stem cell Transplant 02/21/20 at [facility] and currently onMaintenance Velcade every 2 weeks and Revlimid 10 mg 3 weeks on/1 week off started on 07/29/2018) , Anemia, COVID 19 (was Positive 09/17/2019, Negative 12/23/19, Negative on  12/26/19), UTI, gout, kidney stones, Pancytopenia with moderate thrombocytopenia,  and Migraines.      Assessment: Received Pharmacologist EMMI Stroke Google Alert on 01/12/2020.   Red Flag Alert Trigger, Day #13, patient answered no to the following question: Went to follow-up appointment?   [facility] EMMI follow up pending patient contact.      Plan: RNCM will send unsuccessful outreach letter, [facility] pamphlet, handout: Know Before You Go, will call patient for 2nd telephone outreach attempt within 4 business days, [facility] EMMI follow up, and proceed with case closure, after 4th unsuccessful outreach call.        Jahniyah Revere H. Annia Friendly, BSN, [place] [facility] Telephonic CM Phone: [phone] Fax: [phone]

## 2020-02-03 ENCOUNTER — Other Ambulatory Visit: Payer: Self-pay

## 2020-02-03 ENCOUNTER — Ambulatory Visit (HOSPITAL_COMMUNITY): Payer: BC Managed Care – PPO | Admitting: Speech Pathology

## 2020-02-03 ENCOUNTER — Encounter (HOSPITAL_COMMUNITY): Payer: Self-pay | Admitting: Speech Pathology

## 2020-02-03 DIAGNOSIS — R41841 Cognitive communication deficit: Secondary | ICD-10-CM

## 2020-02-03 DIAGNOSIS — R278 Other lack of coordination: Secondary | ICD-10-CM | POA: Diagnosis not present

## 2020-02-03 DIAGNOSIS — R4701 Aphasia: Secondary | ICD-10-CM

## 2020-02-03 LAB — PROTEIN ELECTROPHORESIS, SERUM
A/G Ratio: 1.3 (ref 0.7–1.7)
Albumin ELP: 3.6 g/dL (ref 2.9–4.4)
Alpha-1-Globulin: 0.2 g/dL (ref 0.0–0.4)
Alpha-2-Globulin: 0.7 g/dL (ref 0.4–1.0)
Beta Globulin: 1 g/dL (ref 0.7–1.3)
Gamma Globulin: 0.9 g/dL (ref 0.4–1.8)
Globulin, Total: 2.8 g/dL (ref 2.2–3.9)
Total Protein ELP: 6.4 g/dL (ref 6.0–8.5)

## 2020-02-03 LAB — KAPPA/LAMBDA LIGHT CHAINS
Kappa free light chain: 28.7 mg/L — ABNORMAL HIGH (ref 3.3–19.4)
Kappa, lambda light chain ratio: 1.59 (ref 0.26–1.65)
Lambda free light chains: 18 mg/L (ref 5.7–26.3)

## 2020-02-03 IMAGING — US US PELVIS COMPLETE WITH TRANSVAGINAL
1 series · 13 of 25 positions shown · non-contrast
Comparison: CT abdomen and pelvis 10/25/2017

CLINICAL DATA: LEFT pelvic pain in a female when moving for 2-3
weeks, prior hysterectomy and RIGHT oophorectomy 0447

EXAM:
TRANSABDOMINAL AND TRANSVAGINAL ULTRASOUND OF PELVIS
TECHNIQUE: Both transabdominal and transvaginal ultrasound examinations of the
pelvis were performed. Transabdominal technique was performed for
global imaging of the pelvis including uterus, ovaries, adnexal
regions, and pelvic cul-de-sac. It was necessary to proceed with
endovaginal exam following the transabdominal exam to visualize the
LEFT ovary.

[Series 1: us pelvis complete with transvaginal · 0.24mm/px · 13 of 53 slices shown]
[im 1/53]
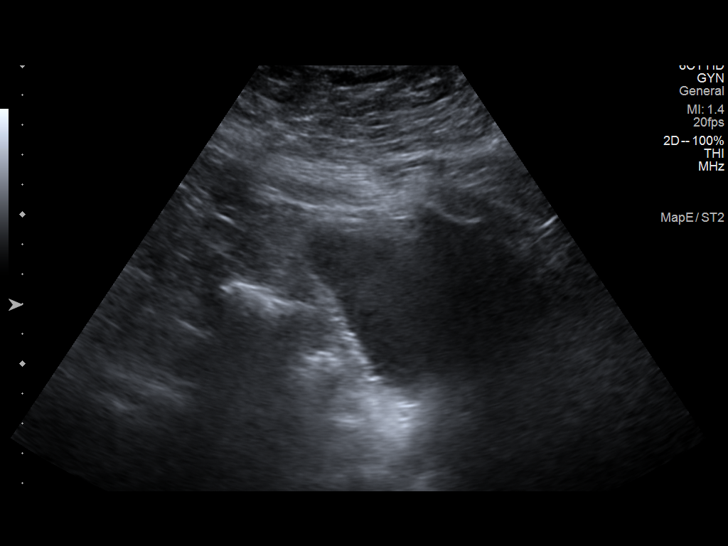
[im 5/53]
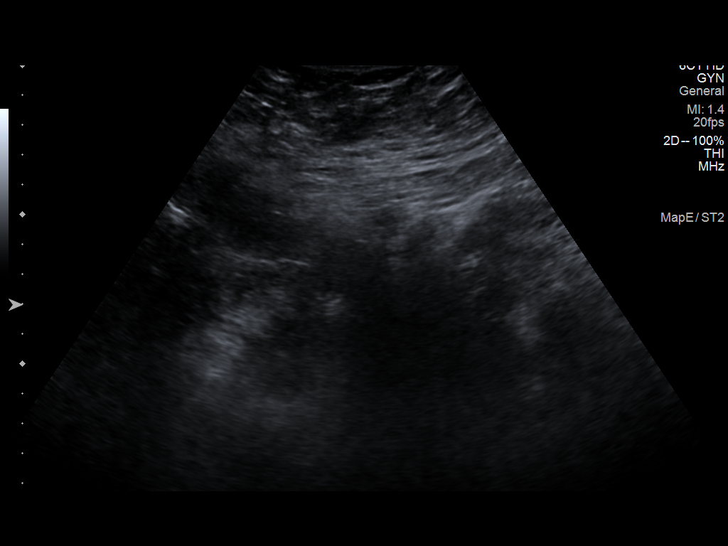
[im 9/53]
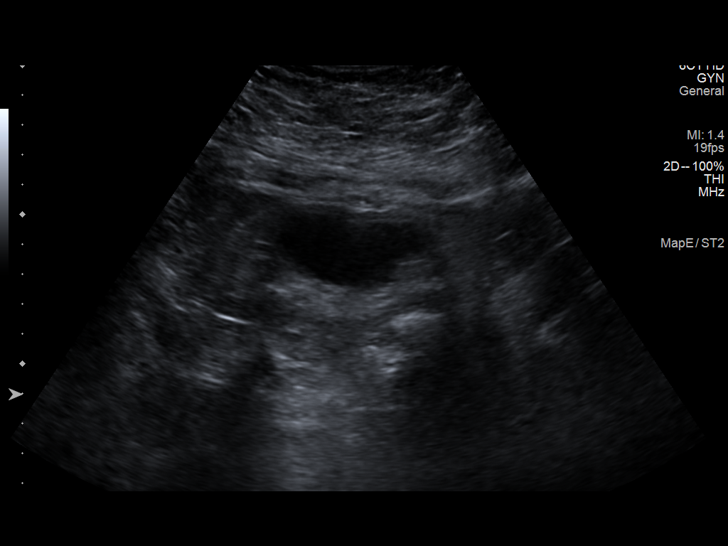
[im 14/53]
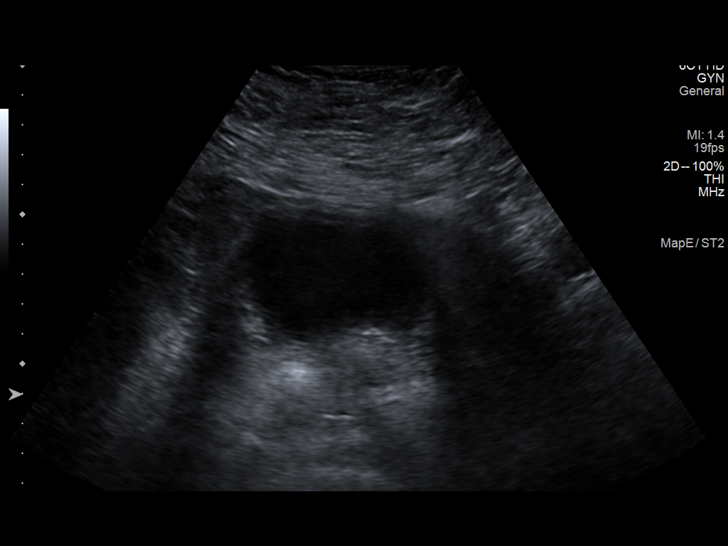
[im 18/53]
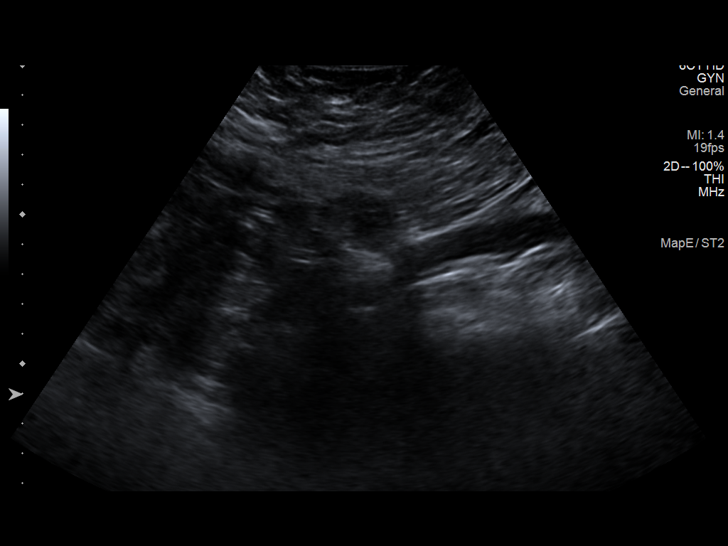
[im 22/53]
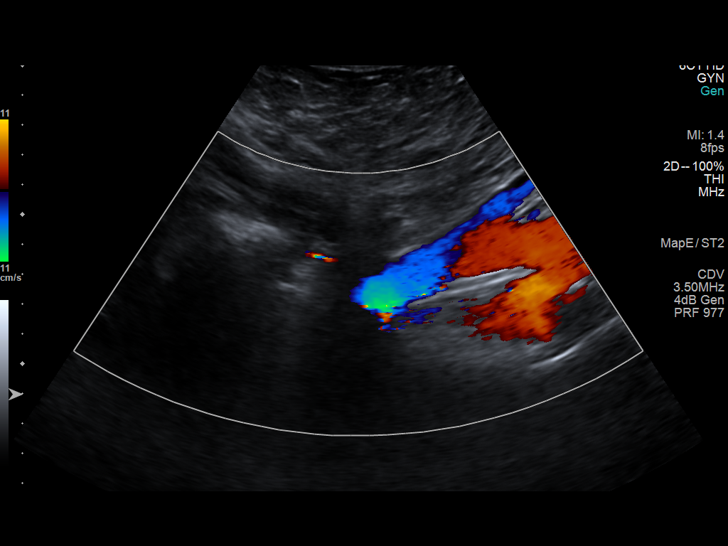
[im 27/53]
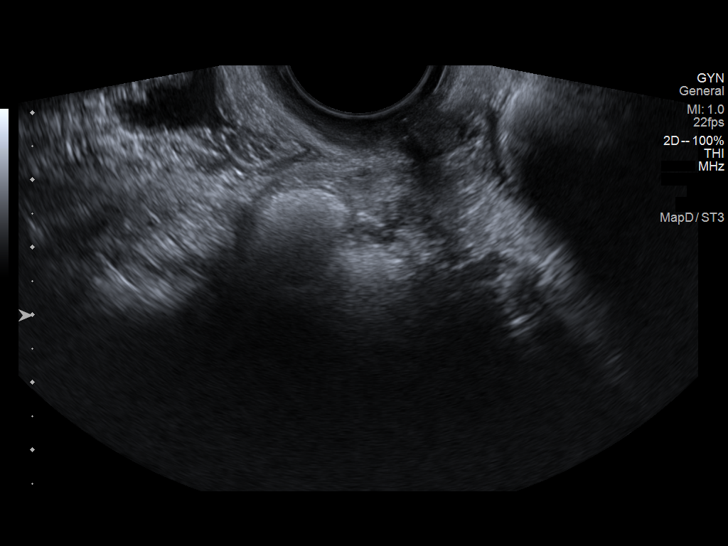
[im 31/53]
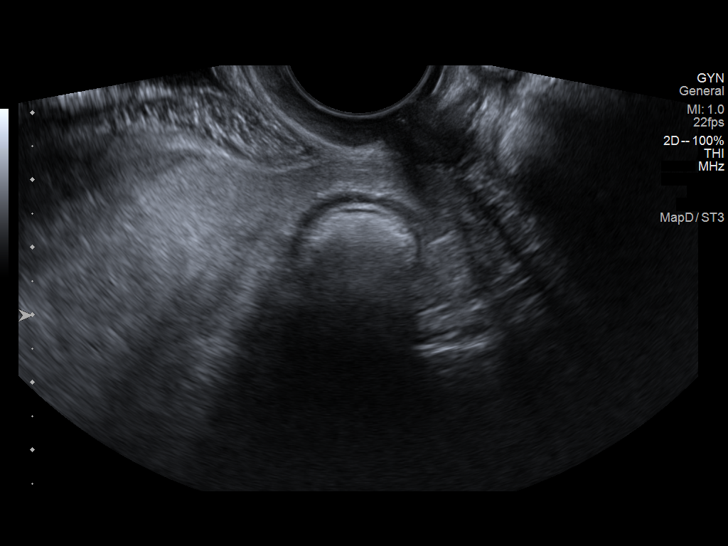
[im 35/53]
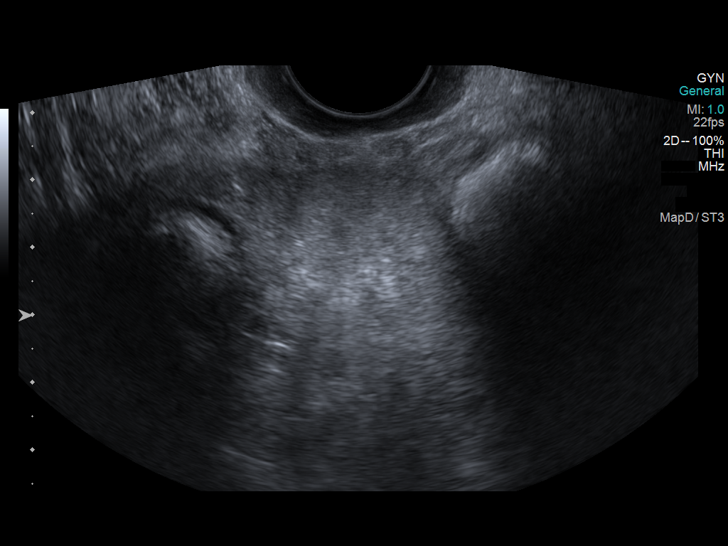
[im 40/53]
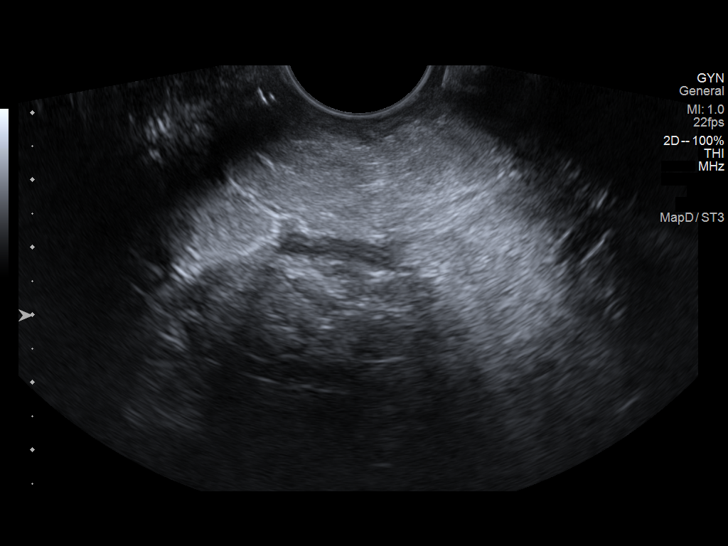
[im 44/53]
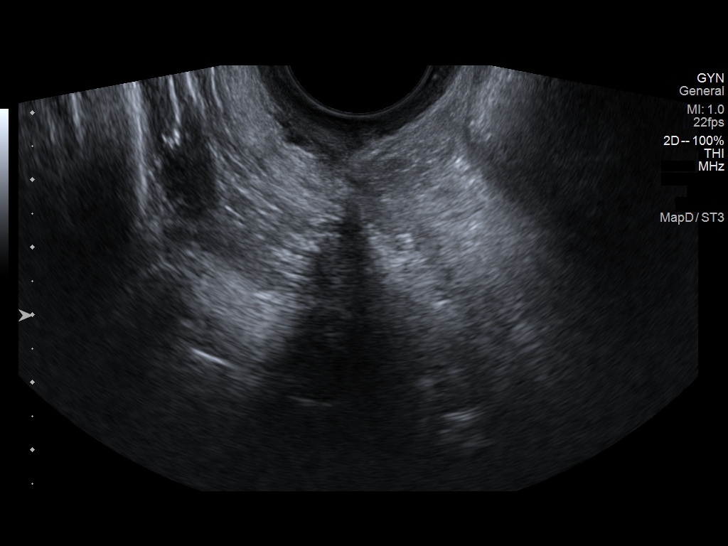
[im 48/53]
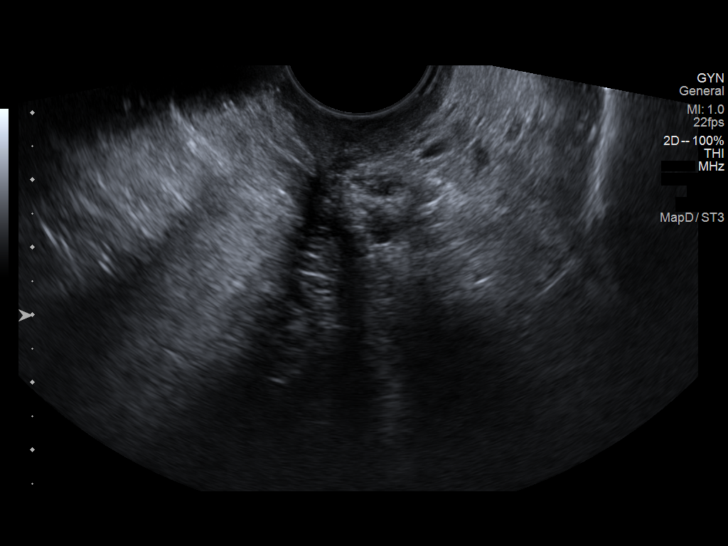
[im 53/53]
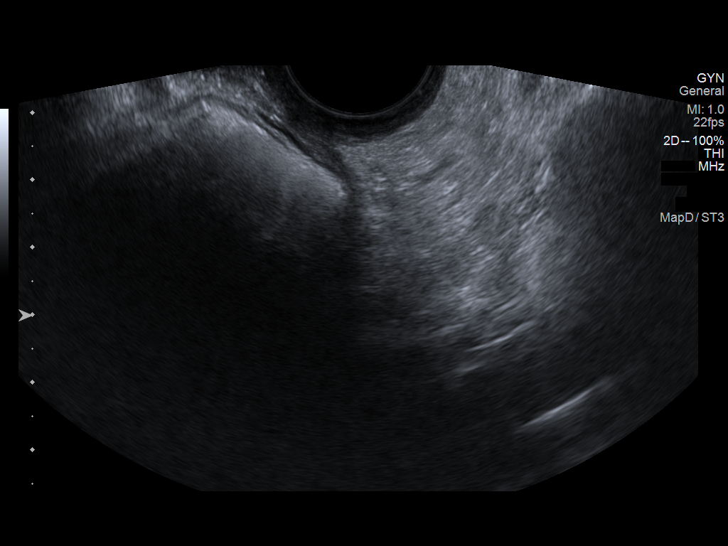

[13 of 25 positions shown; findings below may reference images not displayed]

FINDINGS: Uterus

Surgically absent

Endometrium

N/A

Right ovary

Surgically absent

Left ovary

Not visualized on either transabdominal or endovaginal imaging,
likely obscured by bowel; LEFT ovary was visualized and normal in
size on the prior CT exam from 8637.

Other findings

No free pelvic fluid. No adnexal masses. Visualized portion of
urinary bladder unremarkable. Numerous bowel loops noted in pelvis.
IMPRESSION: Post hysterectomy and RIGHT oophorectomy.

Nonvisualization of LEFT ovary, likely due to numerous bowel loops
in pelvis.

No pelvic sonographic abnormalities identified.

## 2020-02-03 NOTE — Therapy (Signed)
[place], [place], [address] Phone: [phone]   Fax:  [phone]  Speech Language Pathology Treatment  Patient Details  Name: Joanna Reid MRN: [id number] Date of Birth: 06-Sep-1965 Referring Provider (SLP): Lala Lund, MD   Encounter Date: 02/03/2020   End of Session - 02/03/20 1102    Visit Number 11    Number of Visits 16    Date for SLP Re-Evaluation 03/01/20    Authorization Type [employer] Comm PPO   $30 copay, 60 combined visit limit, OOP max $5350   SLP Start Time 2585    SLP Stop Time  1130    SLP Time Calculation (min) 46 min    Activity Tolerance Patient tolerated treatment well           Past Medical History:  Diagnosis Date  . Acid reflux   . Allergic rhinitis   . Cancer ([place])    multiple myeloma  . Diabetes mellitus    type 2  . Gout   . Gout   . HBP (high blood pressure)   . History of kidney stones   . Migraines     Past Surgical History:  Procedure Laterality Date  . BREAST CYST EXCISION Left    2009 no visible scar on skin  . CESAREAN SECTION    . COLONOSCOPY WITH PROPOFOL N/A 12/25/2019   Procedure: COLONOSCOPY WITH PROPOFOL;  Surgeon: Rogene Houston, MD;  [facility];  Service: Endoscopy;  Laterality: N/A;  730  . EXTRACORPOREAL SHOCK WAVE LITHOTRIPSY Left 10/10/2017   Procedure: LEFT EXTRACORPOREAL SHOCK WAVE LITHOTRIPSY (ESWL);  Surgeon: Bjorn Loser, MD;  Location: [facility];  Service: Urology;  Laterality: Left;  . EYE SURGERY    . HEMORRHOID SURGERY N/A 11/19/2012   Procedure: HEMORRHOIDECTOMY;  Surgeon: Jamesetta So, MD;  Location: [facility];  Service: General;  Laterality: N/A;  . kidney stones  1998  . LAPAROSCOPIC UNILATERAL SALPINGO OOPHERECTOMY  05/14/2012   Procedure: LAPAROSCOPIC UNILATERAL SALPINGO OOPHORECTOMY;  Surgeon: [name], MD;  Location: [facility];  Service: Gynecology;  Laterality: Right;  laparoscopic right salpingo-oophorectomy  .  PARTIAL HYSTERECTOMY    . POLYPECTOMY  12/25/2019   Procedure: POLYPECTOMY;  Surgeon: Rogene Houston, MD;  [facility];  Service: Endoscopy;;  . TONSILECTOMY, ADENOIDECTOMY, BILATERAL MYRINGOTOMY AND TUBES    . VESICOVAGINAL FISTULA CLOSURE W/ TAH      There were no vitals filed for this visit.   Subjective Assessment - 02/03/20 1058    Subjective "I had an appointment with the endocrinologist."    Currently in Pain? No/denies              ADULT SLP TREATMENT - 02/03/20 1100      General Information   Behavior/Cognition Alert;Cooperative;Pleasant mood    Patient Positioning Upright in chair    Oral care provided N/A    HPI Pt admitted on 12/26/19 for stroke. TPA note given, outside of time window. MRI 12/27/19 acute ischemic nonhemorrhagic left MCA territory infarct involving the left parietal lobe, few scattered punctate acute ischemic nonhemorrhagic infarcts bilateral frontal and parietal lobes as wellas the left cerebellum, small remote right cerebellar infarcts. PMH Essential hypertension,  obesity, Multiple myeloma ([place]), DM, Anemia, COVID. Pt is referred for outpatient SLP evaluation and treatment for aphasia from acute CVA.      Treatment Provided   Treatment provided Cognitive-Linquistic      Pain Assessment   Pain  Assessment No/denies pain      Cognitive-Linquistic Treatment   Treatment focused on Aphasia;Patient/family/caregiver education    Skilled Treatment Answering Wh-questions, use of communication support page, implementing word finding and oral reading strategies, use of orthographic cues, patient centered approach, reading comprehension, self evaluation, and oral reading tasks      Assessment / Recommendations / Parks with current plan of care      Progression Toward Goals   Progression toward goals Progressing toward goals              SLP Short Term Goals - 02/03/20 1103      SLP SHORT TERM GOAL #1   Title Pt will  implement word-finding strategies with 80% accuracy when unable to verbalize desired word in conversation/functional tasks with mod assist.    Baseline ~60%    Time 4    Period Weeks    Status Achieved    Target Date 01/29/20      SLP SHORT TERM GOAL #2   Title Pt will describe objects and pictures by providing at least three salient features as judged by clinician with 90% acc when provided min cues.    Baseline 66%    Time 4    Period Weeks    Status Achieved    Target Date 01/29/20      SLP SHORT TERM GOAL #3   Title Pt will increase divergent naming to 7+ items per concrete category with min cues.    Baseline 5    Time 4    Period Weeks    Status Achieved    Target Date 01/29/20      SLP SHORT TERM GOAL #4   Title Pt will complete basic sentence level reading comprehension tasks with 80% acc and min cues for error awareness.    Baseline 65% sentence level with multiple choice response    Time 4    Period Weeks    Status Achieved    Target Date 01/29/20      SLP SHORT TERM GOAL #5   Title Pt will be able to write personal/bio information with 90% acc when provided mod cues    Baseline Able to copy name only    Time 4    Period Weeks    Status Achieved    Target Date 01/29/20      SLP SHORT TERM GOAL #6   Title Pt will respond to open-ended questions using a complete sentence with 80% acc and min cues.    Baseline telegraphic responses    Time 4    Period Weeks    Status On-going    Target Date 02/29/20      SLP SHORT TERM GOAL #7   Title Pt will implement word-finding strategies with 90% accuracy when unable to verbalize desired word in conversation/functional tasks with min assist.    Baseline 80% mi/mod assist    Time 4    Period Weeks    Status On-going    Target Date 03/01/20      SLP SHORT TERM GOAL #8   Title Pt will describe objects and pictures without naming by providing at least 4 salient features as judged by clinician with 90% acc when provided  indirect cues.    Baseline 80%    Time 4    Period Weeks    Status On-going    Target Date 03/01/20      SLP SHORT TERM GOAL  #9   TITLE  Pt will complete moderately complex sentence and paragraph reading comprehension tasks with 90% acc with min assist.    Baseline sentence 80%    Time 4    Period Weeks    Status On-going    Target Date 03/01/20            SLP Long Term Goals - 02/03/20 1103      SLP LONG TERM GOAL #1   Title Pt will communicate moderately complex thoughts and feelings to [facility] with use of strategies as needed.    Baseline mod assist    Time 8    Period Weeks    Status On-going            Plan - 02/03/20 1103    Clinical Impression Statement Pt was accompanied to therapy today by her spouse. We added dividers to her notebook to help organize her SLP work. She made attempts to self correct paraphasic errors in conversational speech 100% of the time and was able to do so with min cues and allowance for extra time. We created a list of "challenging words" for her to add to and practice at home. She orally read paragraph length material with 95% acc with allowance for extra time and self corrections. Reading comprehension was 100%. Pt continues to make excellent progress toward goals. Continue with plan of care.    Speech Therapy Frequency 2x / week    Duration 4 weeks   8 weeks   Treatment/Interventions SLP instruction and feedback;Internal/external aids;Compensatory strategies;Compensatory techniques;Patient/family education;Language facilitation;Cueing hierarchy;Multimodal communcation approach    Potential to Achieve Goals Good    SLP Home Exercise Plan Pt will complete HEP as assigned to facilitate carryover of treatment strategies and techniques in home environment with assist from spouse as needed.    Consulted and Agree with Plan of Care Patient;Family member/caregiver    Family Member Consulted Husband, [name]           Patient will benefit from  skilled therapeutic intervention in order to improve the following deficits and impairments:   Aphasia  Cognitive communication deficit    Problem List Patient Active Problem List   Diagnosis Date Noted  . 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 H/o COVID-19--- was Positive 09/17/2019, Negative 12/23/19 AND also Neg on  12/26/19 12/26/2019  . Acute bronchiolitis due to respiratory syncytial virus (RSV) 08/11/2018  . Hypomagnesemia 08/10/2018  . Hyperlipidemia 08/09/2018  . Anemia 08/09/2018  . Leukopenia 08/09/2018  . Hypokalemia 08/09/2018  . Hypocalcemia 08/09/2018  . Lactic acidosis 08/09/2018  . Sepsis ([place]) 04/25/2018  . Goals of care, counseling/discussion 11/01/2017  . Multiple myeloma ([place]) 10/29/2017  . Hypercalcemia 10/29/2017  . Type 2 diabetes mellitus without complication, without long-term current use of insulin ([place]) 01/27/2016  . Morbid obesity due to excess calories ([place]) 01/27/2016  . Anxiety as acute reaction to exceptional stress 01/13/2015  . Attention deficit hyperactivity disorder (ADHD), 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  . Gout 01/20/2014  . GERD (gastroesophageal reflux disease) 01/20/2014  . Essential hypertension, benign 01/20/2014  . Plantar fasciitis of left foot 09/19/2011   Thank you,  Genene Churn, [place]  [facility] 02/03/2020, 11:04 AM  [place] [address] [place], [place], [address] Phone: [phone]   Fax:  [phone]   Name: AMEISHA MCCLELLAN MRN: [id number] Date of Birth: [birth year]

## 2020-02-05 ENCOUNTER — Encounter (HOSPITAL_COMMUNITY): Payer: Self-pay | Admitting: Occupational Therapy

## 2020-02-05 ENCOUNTER — Ambulatory Visit (HOSPITAL_COMMUNITY): Payer: BC Managed Care – PPO | Admitting: Occupational Therapy

## 2020-02-05 ENCOUNTER — Ambulatory Visit: Payer: Self-pay | Admitting: *Deleted

## 2020-02-05 ENCOUNTER — Other Ambulatory Visit: Payer: Self-pay

## 2020-02-05 DIAGNOSIS — R29898 Other symptoms and signs involving the musculoskeletal system: Secondary | ICD-10-CM

## 2020-02-05 DIAGNOSIS — R278 Other lack of coordination: Secondary | ICD-10-CM

## 2020-02-05 LAB — IMMUNOFIXATION ELECTROPHORESIS
IgA: 122 mg/dL (ref 87–352)
IgG (Immunoglobin G), Serum: 850 mg/dL (ref 586–1602)
IgM (Immunoglobulin M), Srm: 14 mg/dL — ABNORMAL LOW (ref 26–217)
Total Protein ELP: 6.3 g/dL (ref 6.0–8.5)

## 2020-02-05 NOTE — Therapy (Signed)
[place], [place], [address] Phone: [phone]   Fax:  [phone]  Occupational Therapy Treatment and Discharge  Patient Details  Name: Joanna Reid MRN: [id number] Date of Birth: 1965-09-06 Referring Provider (OT): Dr. Lala Lund  OCCUPATIONAL THERAPY DISCHARGE SUMMARY  Visits from Start of Care: 3  Current functional level related to goals / functional outcomes: Near baseline. Performing ADLs. Focus on FM and handwriting.   Remaining deficits: Requiring increased time for handwriting tasks and demonstrating decreased control. However, pt demonstrating good progress and awareness of HEP.   Education / Equipment: Reviewed handwriting exercises and HEP Plan: Patient agrees to discharge.  Patient goals were partially met. Patient is being discharged due to being pleased with the current functional level.  ?????         Encounter Date: 02/05/2020   OT End of Session - 02/05/20 1023    Visit Number 3    Number of Visits 3    Date for OT Re-Evaluation 02/05/20    Authorization Type [employer] Comm PPO; $30 copay    Authorization Time Period 60 visit limit combined    Authorization - Visit Number 8    Authorization - Number of Visits 74    OT Start Time 0950    OT Stop Time 1030    OT Time Calculation (min) 40 min    Activity Tolerance Patient tolerated treatment well    Behavior During Therapy WFL for tasks assessed/performed           Past Medical History:  Diagnosis Date  . Acid reflux   . Allergic rhinitis   . Cancer ([place])    multiple myeloma  . Diabetes mellitus    type 2  . Gout   . Gout   . HBP (high blood pressure)   . History of kidney stones   . Migraines     Past Surgical History:  Procedure Laterality Date  . BREAST CYST EXCISION Left    2009 no visible scar on skin  . CESAREAN SECTION    . COLONOSCOPY WITH PROPOFOL N/A 12/25/2019   Procedure: COLONOSCOPY WITH PROPOFOL;   Surgeon: Rogene Houston, MD;  [facility];  Service: Endoscopy;  Laterality: N/A;  730  . EXTRACORPOREAL SHOCK WAVE LITHOTRIPSY Left 10/10/2017   Procedure: LEFT EXTRACORPOREAL SHOCK WAVE LITHOTRIPSY (ESWL);  Surgeon: Bjorn Loser, MD;  [facility];  Service: Urology;  Laterality: Left;  . EYE SURGERY    . HEMORRHOID SURGERY N/A 11/19/2012   Procedure: HEMORRHOIDECTOMY;  Surgeon: Jamesetta So, MD;  Location: [facility];  Service: General;  Laterality: N/A;  . kidney stones  1998  . LAPAROSCOPIC UNILATERAL SALPINGO OOPHERECTOMY  05/14/2012   Procedure: LAPAROSCOPIC UNILATERAL SALPINGO OOPHORECTOMY;  Surgeon: [name], MD;  Location: [facility];  Service: Gynecology;  Laterality: Right;  laparoscopic right salpingo-oophorectomy  . PARTIAL HYSTERECTOMY    . POLYPECTOMY  12/25/2019   Procedure: POLYPECTOMY;  Surgeon: Rogene Houston, MD;  [facility];  Service: Endoscopy;;  . TONSILECTOMY, ADENOIDECTOMY, BILATERAL MYRINGOTOMY AND TUBES    . VESICOVAGINAL FISTULA CLOSURE W/ TAH      There were no vitals filed for this visit.   Subjective Assessment - 02/05/20 0952    Subjective  S: "I plan on going to the flea market this weekend." and "I feel very good about the progress."    Pertinent History Pt is a 54 y/o female s/p left CVA  on 12/26/2019 with primary deficits being speech and language and fine motor coordination. Pt presents with daughter for evaluation, was referred to occupational therapy for evaluation and treatment by Dr. Lala Lund.    Currently in Pain? No/denies              [facility] OT Assessment - 02/05/20 0953      Assessment   Medical Diagnosis s/p left CVA    Referring Provider (OT) Dr. Lala Lund      Coordination   [address] Peg Test Right;Left    Right 9 Hole Peg Test 24.32   26.48     ROM / Strength   AROM / PROM / Strength Strength      Strength   Strength Assessment Site Hand    Right/Left hand Right    Right Hand Grip  (lbs) 79   75   Right Hand Lateral Pinch 17 lbs   16   Right Hand 3 Point Pinch 17 lbs   16                   OT Treatments/Exercises (OP) - 02/05/20 1020      Exercises   Exercises Hand      Fine Motor Coordination (Hand/Wrist)   Fine Motor Coordination Handwriting;Tracing    Handwriting Reviewing handwriting. Pt has been practing hand writing and coloring in lines at home. Warm up activity with tracing highlighter curcy lines to simulate cursive writing. Pt then copying a four sentence paragraph.    Tracing Warm up activity with tracing highlighter curcy lines to simulate cursive writing. x5                  OT Education - 02/05/20 1245    Education Details Reviewed HEP, hand writing activities, pencile control, and answered questions    Person(s) Educated Patient    Methods Explanation;Demonstration    Comprehension Verbalized understanding;Returned demonstration            OT Short Term Goals - 02/05/20 1030      OT SHORT TERM GOAL #1   Title Pt will be provided with and educated on HEP to improve fine motor coordination required for texting and handwriting.    Time 4    Period Weeks    Status Achieved    Target Date 02/07/20      OT SHORT TERM GOAL #2   Title Pt will demonstrate improved fine motor coordination in right hand by completing 9 hole peg test in under 23"    Time 4    Period Weeks    Status Partially Met      OT SHORT TERM GOAL #3   Title Pt will demonstrate improved handwriting by writing a 5 word sentence using smooth strokes and acknowledging lines 4/5 trials.    Time 4    Period Weeks    Status Achieved                    Plan - 02/05/20 1024    Clinical Impression Statement A:  Pt continues to demonstrate progress towards goals. Pt is making steady improvements in fine motor coordination and handwriting.  Focused session on tracing and handwriting. Having pt copy four sentence paragraph providing first in print and  then cursive examples. Reviewed goals and pt feels she has met them and demonstrating increased speed with nine-hole peg test. Providing education for further HEP.    Body Structure / Function / Physical Skills  ADL;UE functional use;FMC;Coordination;IADL    Plan P: DC from therapy.    Consulted and Agree with Plan of Care Patient;Family member/caregiver           Patient will benefit from skilled therapeutic intervention in order to improve the following deficits and impairments:   Body Structure / Function / Physical Skills: ADL, UE functional use, FMC, Coordination, IADL       Visit Diagnosis: Other lack of coordination  Other symptoms and signs involving the musculoskeletal system    Problem List Patient Active Problem List   Diagnosis Date Noted  . 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 H/o COVID-19--- was Positive 09/17/2019, Negative 12/23/19 AND also Neg on  12/26/19 12/26/2019  . Acute bronchiolitis due to respiratory syncytial virus (RSV) 08/11/2018  . Hypomagnesemia 08/10/2018  . Hyperlipidemia 08/09/2018  . Anemia 08/09/2018  . Leukopenia 08/09/2018  . Hypokalemia 08/09/2018  . Hypocalcemia 08/09/2018  . Lactic acidosis 08/09/2018  . Sepsis ([place]) 04/25/2018  . Goals of care, counseling/discussion 11/01/2017  . Multiple myeloma ([place]) 10/29/2017  . Hypercalcemia 10/29/2017  . Type 2 diabetes mellitus without complication, without long-term current use of insulin ([place]) 01/27/2016  . Morbid obesity due to excess calories ([place]) 01/27/2016  . Anxiety as acute reaction to exceptional stress 01/13/2015  . Attention deficit hyperactivity disorder (ADHD), 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  . Gout 01/20/2014  . GERD (gastroesophageal reflux disease) 01/20/2014  . Essential hypertension, benign 01/20/2014  . Plantar fasciitis of left foot 09/19/2011    Neal Dy, MSOT, OTR/L 02/05/2020, 12:47 PM  [place], [place], [address] Phone: [phone]   Fax:  [phone]  Name: Joanna Reid MRN: [id number] Date of Birth: 08/10/1965

## 2020-02-08 ENCOUNTER — Other Ambulatory Visit: Payer: Self-pay | Admitting: *Deleted

## 2020-02-08 ENCOUNTER — Encounter (HOSPITAL_COMMUNITY): Payer: Self-pay | Admitting: Speech Pathology

## 2020-02-08 ENCOUNTER — Ambulatory Visit (HOSPITAL_COMMUNITY): Payer: BC Managed Care – PPO | Attending: Internal Medicine | Admitting: Speech Pathology

## 2020-02-08 ENCOUNTER — Other Ambulatory Visit: Payer: Self-pay

## 2020-02-08 DIAGNOSIS — R4701 Aphasia: Secondary | ICD-10-CM

## 2020-02-08 DIAGNOSIS — R41841 Cognitive communication deficit: Secondary | ICD-10-CM | POA: Diagnosis present

## 2020-02-08 NOTE — Patient Outreach (Addendum)
[facility]) Care Management  02/08/2020  JANIQUE HOEFER [birth year] [id number]   Subjective: Telephone call to patient's home  / mobile number, no answer, left HIPAA compliant voicemail message, and requested call back.    Objective: Per KPN (Knowledge Performance Now, point of care tool) and chart review, patient hospitalized 12/26/2019 - 12/28/2019 for Stroke (cerebrum), confusion, speech deficits primarily expressive aphasia , .   Patient also has a history of hypertension, diabetes, Multiple myeloma ( status post Stem cell Transplant 02/21/20 at [facility] and currently onMaintenance Velcade every 2 weeks and Revlimid 10 mg 3 weeks on/1 week off started on 07/29/2018) , Anemia, COVID 19 (was Positive 09/17/2019, Negative 12/23/19, Negative on 12/26/19), UTI, gout, kidney stones, Pancytopenia with moderatethrombocytopenia,  and Migraines.      Assessment: Received Pharmacologist EMMI Stroke Google Alert on 01/12/2020.   Red Flag Alert Trigger, Day #13, patient answered no to the following question: Went to follow-up appointment?   [facility] EMMI follow up pending patient contact.      Plan: RNCM has sent unsuccessful outreach letter, [facility] pamphlet, handout: Know Before You Go, will call patient for 3rd telephone outreach attempt within 14 business days, [facility] EMMI follow up, and proceed with case closure, after 4th unsuccessful outreach call.       Cairo Lingenfelter H. Annia Friendly, BSN, [place] [facility] Telephonic CM Phone: [phone] Fax: [phone]

## 2020-02-08 NOTE — Therapy (Signed)
[place], [place], [address] Phone: [phone]   Fax:  [phone]  Speech Language Pathology Treatment  Patient Details  Name: Joanna Reid MRN: [id number] Date of Birth: May 26, 1966 Referring Provider (SLP): Lala Lund, MD   Encounter Date: 02/08/2020   End of Session - 02/08/20 1121    Visit Number 12    Number of Visits 16    Date for SLP Re-Evaluation 03/01/20    Authorization Type [employer] PPO   $30 copay, 60 combined visit limit, OOP max $[id number]   SLP Start Time 1038    SLP Stop Time  1127    SLP Time Calculation (min) 49 min    Activity Tolerance Patient tolerated treatment well           Past Medical History:  Diagnosis Date  . Acid reflux   . Allergic rhinitis   . Cancer ([place])    multiple myeloma  . Diabetes mellitus    type 2  . Gout   . Gout   . HBP (high blood pressure)   . History of kidney stones   . Migraines     Past Surgical History:  Procedure Laterality Date  . BREAST CYST EXCISION Left    2009 no visible scar on skin  . CESAREAN SECTION    . COLONOSCOPY WITH PROPOFOL N/A 12/25/2019   Procedure: COLONOSCOPY WITH PROPOFOL;  Surgeon: Rogene Houston, MD;  [facility]: [facility];  Service: Endoscopy;  Laterality: N/A;  730  . EXTRACORPOREAL SHOCK WAVE LITHOTRIPSY Left 10/10/2017   Procedure: LEFT EXTRACORPOREAL SHOCK WAVE LITHOTRIPSY (ESWL);  Surgeon: Bjorn Loser, MD;  Location: [facility];  Service: Urology;  Laterality: Left;  . EYE SURGERY    . HEMORRHOID SURGERY N/A 11/19/2012   Procedure: HEMORRHOIDECTOMY;  Surgeon: Jamesetta So, MD;  Location: [facility];  Service: General;  Laterality: N/A;  . kidney stones  1998  . LAPAROSCOPIC UNILATERAL SALPINGO OOPHERECTOMY  05/14/2012   Procedure: LAPAROSCOPIC UNILATERAL SALPINGO OOPHORECTOMY;  Surgeon: [name], MD;  Location: [facility];  Service: Gynecology;  Laterality: Right;  laparoscopic right salpingo-oophorectomy  .  PARTIAL HYSTERECTOMY    . POLYPECTOMY  12/25/2019   Procedure: POLYPECTOMY;  Surgeon: Rogene Houston, MD;  Location: [facility];  Service: Endoscopy;;  . TONSILECTOMY, ADENOIDECTOMY, BILATERAL MYRINGOTOMY AND TUBES    . VESICOVAGINAL FISTULA CLOSURE W/ TAH      There were no vitals filed for this visit.   Subjective Assessment - 02/08/20 1045    Subjective "I felt a little sad on Friday."    Patient is accompained by: Family member    Currently in Pain? No/denies             ADULT SLP TREATMENT - 02/08/20 1119      General Information   Behavior/Cognition Alert;Cooperative;Pleasant mood    Patient Positioning Upright in chair    Oral care provided N/A    HPI Pt admitted on 12/26/19 for stroke. TPA note given, outside of time window. MRI 12/27/19 acute ischemic nonhemorrhagic left MCA territory infarct involving the left parietal lobe, few scattered punctate acute ischemic nonhemorrhagic infarcts bilateral frontal and parietal lobes as wellas the left cerebellum, small remote right cerebellar infarcts. PMH Essential hypertension,  obesity, Multiple myeloma ([place]), DM, Anemia, COVID. Pt is referred for outpatient SLP evaluation and treatment for aphasia from acute CVA.      Treatment Provided   Treatment provided Cognitive-Linquistic  Pain Assessment   Pain Assessment No/denies pain      Cognitive-Linquistic Treatment   Treatment focused on Aphasia;Patient/family/caregiver education    Skilled Treatment Answering Wh-questions, use of communication support page, implementing word finding and oral reading strategies, use of orthographic cues, patient centered approach, reading comprehension, self evaluation, and oral reading tasks      Assessment / Recommendations / [place] with current plan of care      Progression Toward Goals   Progression toward goals Progressing toward goals            SLP Education - 02/08/20 1120    Education Details Provided HEP  for complex word finding and naming    Person(s) Educated Patient;Spouse    Methods Explanation;Handout    Comprehension Verbalized understanding            SLP Short Term Goals - 02/08/20 1122      SLP SHORT TERM GOAL #1   Title Pt will implement word-finding strategies with 80% accuracy when unable to verbalize desired word in conversation/functional tasks with mod assist.    Baseline ~60%    Time 4    Period Weeks    Status Achieved    Target Date 01/29/20      SLP SHORT TERM GOAL #2   Title Pt will describe objects and pictures by providing at least three salient features as judged by clinician with 90% acc when provided min cues.    Baseline 66%    Time 4    Period Weeks    Status Achieved    Target Date 01/29/20      SLP SHORT TERM GOAL #3   Title Pt will increase divergent naming to 7+ items per concrete category with min cues.    Baseline 5    Time 4    Period Weeks    Status Achieved    Target Date 01/29/20      SLP SHORT TERM GOAL #4   Title Pt will complete basic sentence level reading comprehension tasks with 80% acc and min cues for error awareness.    Baseline 65% sentence level with multiple choice response    Time 4    Period Weeks    Status Achieved    Target Date 01/29/20      SLP SHORT TERM GOAL #5   Title Pt will be able to write personal/bio information with 90% acc when provided mod cues    Baseline Able to copy name only    Time 4    Period Weeks    Status Achieved    Target Date 01/29/20      SLP SHORT TERM GOAL #6   Title Pt will respond to open-ended questions using a complete sentence with 80% acc and min cues.    Baseline telegraphic responses    Time 4    Period Weeks    Status On-going    Target Date 02/29/20      SLP SHORT TERM GOAL #7   Title Pt will implement word-finding strategies with 90% accuracy when unable to verbalize desired word in conversation/functional tasks with min assist.    Baseline 80% mi/mod assist     Time 4    Period Weeks    Status On-going    Target Date 03/01/20      SLP SHORT TERM GOAL #8   Title Pt will describe objects and pictures without naming by providing at least 4 salient features as judged  by clinician with 90% acc when provided indirect cues.    Baseline 80%    Time 4    Period Weeks    Status On-going    Target Date 03/01/20      SLP SHORT TERM GOAL  #9   TITLE Pt will complete moderately complex sentence and paragraph reading comprehension tasks with 90% acc with min assist.    Baseline sentence 80%    Time 4    Period Weeks    Status On-going    Target Date 03/01/20            SLP Long Term Goals - 02/08/20 1123      SLP LONG TERM GOAL #1   Title Pt will communicate moderately complex thoughts and feelings to [facility] with use of strategies as needed.    Baseline mod assist    Time 8    Period Weeks    Status On-going            Plan - 02/08/20 1122    Clinical Impression Statement Pt was accompanied to therapy today by her spouse. She reported feeling a little emotionally drained because she may have misinterpreted OT's feelings about her progress toward fine motor skills. She was discharged from OT on Friday after three sessions and she said she thought the OT said she still had a long way to go. Pt feels pleased with her progress, but does not feel completely back to baseline in terms of her writing skills. Pt seems to work very hard in therapy and can be hard on herself. We discussed that it was possible that she was completing writing tasks slower due to her tendency to focus on accuracy over speed (which is a positive). This has been a focus in her expressive language skills so that she can identify errors and make corrections as needed. In session, she participated in 35 minute discussion regarding the topic above. She completed moderately complex verbal expressive task to verbalize synonyms, antonyms, sentences with each, and coming up with definitions  for words with multiple meanings. Pt completed each task with 100% acc with min cues. Pt continues to make excellent progress toward goals. Continue with plan of care.    Speech Therapy Frequency 2x / week    Duration 4 weeks   8 weeks   Treatment/Interventions SLP instruction and feedback;Internal/external aids;Compensatory strategies;Compensatory techniques;Patient/family education;Language facilitation;Cueing hierarchy;Multimodal communcation approach    Potential to Achieve Goals Good    SLP Home Exercise Plan Pt will complete HEP as assigned to facilitate carryover of treatment strategies and techniques in home environment with assist from spouse as needed.    Consulted and Agree with Plan of Care Patient;Family member/caregiver    Family Member Consulted Husband, [name]           Patient will benefit from skilled therapeutic intervention in order to improve the following deficits and impairments:   Aphasia  Cognitive communication deficit    Problem List Patient Active Problem List   Diagnosis Date Noted  . 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 H/o COVID-19--- was Positive 09/17/2019, Negative 12/23/19 AND also Neg on  12/26/19 12/26/2019  . Acute bronchiolitis due to respiratory syncytial virus (RSV) 08/11/2018  . Hypomagnesemia 08/10/2018  . Hyperlipidemia 08/09/2018  . Anemia 08/09/2018  . Leukopenia 08/09/2018  . Hypokalemia 08/09/2018  . Hypocalcemia 08/09/2018  . Lactic acidosis 08/09/2018  . Sepsis ([place]) 04/25/2018  . Goals of  care, counseling/discussion 11/01/2017  . Multiple myeloma ([place]) 10/29/2017  . Hypercalcemia 10/29/2017  . Type 2 diabetes mellitus without complication, without long-term current use of insulin ([place]) 01/27/2016  . Morbid obesity due to excess calories ([place]) 01/27/2016  . Anxiety as acute reaction to exceptional stress 01/13/2015  . Attention deficit hyperactivity disorder (ADHD),  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  . Gout 01/20/2014  . GERD (gastroesophageal reflux disease) 01/20/2014  . Essential hypertension, benign 01/20/2014  . Plantar fasciitis of left foot 09/19/2011   Thank you,  Genene Churn, [place]  [facility] 02/08/2020, 11:24 AM  [place] [address] [place], [place], [address] Phone: [phone]   Fax:  [phone]   Name: Joanna Reid MRN: [id number] Date of Birth: [birth year]

## 2020-02-09 ENCOUNTER — Inpatient Hospital Stay (HOSPITAL_COMMUNITY): Payer: BC Managed Care – PPO

## 2020-02-09 ENCOUNTER — Inpatient Hospital Stay (HOSPITAL_COMMUNITY): Payer: BC Managed Care – PPO | Attending: Hematology | Admitting: Hematology

## 2020-02-09 VITALS — BP 128/86 | HR 61 | Temp 97.2°F | Resp 18 | Wt 249.8 lb

## 2020-02-09 DIAGNOSIS — Z8673 Personal history of transient ischemic attack (TIA), and cerebral infarction without residual deficits: Secondary | ICD-10-CM | POA: Insufficient documentation

## 2020-02-09 DIAGNOSIS — Z9484 Stem cells transplant status: Secondary | ICD-10-CM | POA: Diagnosis not present

## 2020-02-09 DIAGNOSIS — Z87891 Personal history of nicotine dependence: Secondary | ICD-10-CM | POA: Diagnosis not present

## 2020-02-09 DIAGNOSIS — Z809 Family history of malignant neoplasm, unspecified: Secondary | ICD-10-CM | POA: Insufficient documentation

## 2020-02-09 DIAGNOSIS — Z79899 Other long term (current) drug therapy: Secondary | ICD-10-CM | POA: Diagnosis not present

## 2020-02-09 DIAGNOSIS — G629 Polyneuropathy, unspecified: Secondary | ICD-10-CM | POA: Insufficient documentation

## 2020-02-09 DIAGNOSIS — C9000 Multiple myeloma not having achieved remission: Secondary | ICD-10-CM | POA: Insufficient documentation

## 2020-02-09 DIAGNOSIS — E119 Type 2 diabetes mellitus without complications: Secondary | ICD-10-CM | POA: Insufficient documentation

## 2020-02-09 DIAGNOSIS — Z8616 Personal history of COVID-19: Secondary | ICD-10-CM | POA: Insufficient documentation

## 2020-02-09 DIAGNOSIS — Z7984 Long term (current) use of oral hypoglycemic drugs: Secondary | ICD-10-CM | POA: Insufficient documentation

## 2020-02-09 DIAGNOSIS — Z90710 Acquired absence of both cervix and uterus: Secondary | ICD-10-CM | POA: Diagnosis not present

## 2020-02-09 NOTE — Progress Notes (Signed)
Treatment held today. No Zometa.

## 2020-02-09 NOTE — Patient Instructions (Signed)
Joanna Reid at [facility] Discharge Instructions  You were seen today by Dr. Delton Coombes. He went over your recent results. You did not receive your injection today. Dr. Delton Coombes will see you back in 4 weeks for labs and follow up.   Thank you for choosing [place] at [facility] to provide your oncology and hematology care.  To afford each patient quality time with our provider, please arrive at least 15 minutes before your scheduled appointment time.   If you have a lab appointment with the [facility] please come in thru the Main Entrance and check in at the main information desk  You need to re-schedule your appointment should you arrive 10 or more minutes late.  We strive to give you quality time with our providers, and arriving late affects you and other patients whose appointments are after yours.  Also, if you no show three or more times for appointments you may be dismissed from the clinic at the providers discretion.     Again, thank you for choosing [facility].  Our hope is that these requests will decrease the amount of time that you wait before being seen by our physicians.       _____________________________________________________________  Should you have questions after your visit to [facility], please contact our office at [phone] between the hours of 8:00 a.m. and 4:30 p.m.  Voicemails left after 4:00 p.m. will not be returned until the following business day.  For prescription refill requests, have your pharmacy contact our office and allow 72 hours.    Cancer Center Support Programs:   > Cancer Support Group  2nd Tuesday of the month 1pm-2pm, Journey Room

## 2020-02-09 NOTE — Progress Notes (Signed)
Joanna Reid, [place] [address]   CLINIC:  Medical Oncology/Hematology  PCP:  [name], DO [address] / [place] [address]  [phone]  REASON FOR VISIT:  Follow-up for multiple myeloma  PRIOR THERAPY:  1. RVD x 4 cycles from 11/01/2017 through 01/14/2018. 2. Stem cell transplant on 02/20/2018.  CURRENT THERAPY: Revlimid  INTERVAL HISTORY:  [name], a 54 y.o. female, returns for routine follow-up for her multiple myeloma. Gisel was last seen on 12/22/2019.  Today she is accompanied by her husband. She reports that she is improving steadily. She is still undergoing speech therapy and has 3 more weeks remaining; she finished her OT on Friday, 7/30. Her texting on her phone and her handwriting is improving. Her numbness and tingling in stable; she is still taking Lyrica. She denies having any new pains.   She had COVID infection in 09/2019 and is inquiring whether to get the COVID vaccine. She is scheduled to go to [facility] on [facility]/29/2020.   REVIEW OF SYSTEMS:  Review of Systems  Constitutional: Negative for appetite change and fatigue.  Musculoskeletal: Negative for arthralgias.  Neurological: Positive for numbness (stable).  All other systems reviewed and are negative.   PAST MEDICAL/SURGICAL HISTORY:  Past Medical History:  Diagnosis Date  . Acid reflux   . Allergic rhinitis   . Cancer ([place])    multiple myeloma  . Diabetes mellitus    type 2  . Gout   . Gout   . HBP (high blood pressure)   . History of kidney stones   . Migraines    Past Surgical History:  Procedure Laterality Date  . BREAST CYST EXCISION Left    2009 no visible scar on skin  . CESAREAN SECTION    . COLONOSCOPY WITH PROPOFOL N/A 12/25/2019   Procedure: COLONOSCOPY WITH PROPOFOL;  Surgeon: Rogene Houston, MD;  [facility];  Service: Endoscopy;  Laterality: N/A;  730  . EXTRACORPOREAL SHOCK WAVE LITHOTRIPSY Left 10/10/2017    Procedure: LEFT EXTRACORPOREAL SHOCK WAVE LITHOTRIPSY (ESWL);  Surgeon: Bjorn Loser, MD;  Location: [facility];  Service: Urology;  Laterality: Left;  . EYE SURGERY    . HEMORRHOID SURGERY N/A 11/19/2012   Procedure: HEMORRHOIDECTOMY;  Surgeon: Jamesetta So, MD;  Location: [facility];  Service: General;  Laterality: N/A;  . kidney stones  1998  . LAPAROSCOPIC UNILATERAL SALPINGO OOPHERECTOMY  05/14/2012   Procedure: LAPAROSCOPIC UNILATERAL SALPINGO OOPHORECTOMY;  Surgeon: [name], MD;  Location: [facility];  Service: Gynecology;  Laterality: Right;  laparoscopic right salpingo-oophorectomy  . PARTIAL HYSTERECTOMY    . POLYPECTOMY  12/25/2019   Procedure: POLYPECTOMY;  Surgeon: Rogene Houston, MD;  [facility];  Service: Endoscopy;;  . TONSILECTOMY, ADENOIDECTOMY, BILATERAL MYRINGOTOMY AND TUBES    . VESICOVAGINAL FISTULA CLOSURE W/ TAH      SOCIAL HISTORY:  Social History   Socioeconomic History  . Marital status: Married    Spouse name: Not on file  . Number of children: Not on file  . Years of education: 27  . Highest education level: Not on file  Occupational History    Employer: [employer]  Tobacco Use  . Smoking status: Former Smoker    Quit date: 02/27/1999    Years since quitting: 20.9  . Smokeless tobacco: Never Used  . Tobacco comment: socially   Vaping Use  . Vaping Use: Never used  Substance and Sexual Activity  .  Alcohol use: No    Alcohol/week: 0.0 standard drinks  . Drug use: No  . Sexual activity: Yes  Other Topics Concern  . Not on file  Social History Narrative  . Not on file   Social Determinants of Health   Financial Resource Strain: Low Risk   . Difficulty of Paying Living Expenses: Not hard at all  Food Insecurity: No Food Insecurity  . Worried About Charity fundraiser in the Last Year: Never true  . Ran Out of Food in the Last Year: Never true  Transportation Needs: No Transportation Needs  . Lack of Transportation (Medical): No  .  Lack of Transportation (Non-Medical): No  Physical Activity: Insufficiently Active  . Days of Exercise per Week: 7 days  . Minutes of Exercise per Session: 10 min  Stress:   . Feeling of Stress :   Social Connections: Socially Integrated  . Frequency of Communication with Friends and Family: More than three times a week  . Frequency of Social Gatherings with Friends and Family: More than three times a week  . Attends Religious Services: More than 4 times per year  . Active Member of Clubs or Organizations: Yes  . Attends Archivist Meetings: More than 4 times per year  . Marital Status: Married  Human resources officer Violence: Not At Risk  . Fear of Current or Ex-Partner: No  . Emotionally Abused: No  . Physically Abused: No  . Sexually Abused: No    FAMILY HISTORY:  Family History  Problem Relation Age of Onset  . Arthritis Other   . Cancer Other   . Diabetes Other   . Hypertension Mother   . Dementia Mother   . Diabetes Father   . ALS Father   . Diabetes Brother   . Hypertension Brother   . Cancer Paternal Aunt   . COPD Maternal Grandmother   . Cancer Maternal Grandfather   . Anesthesia problems Paternal Grandfather     CURRENT MEDICATIONS:  Current Outpatient Medications  Medication Sig Dispense Refill  . acyclovir (ZOVIRAX) 400 MG tablet TAKE (1) TABLET BY MOUTH TWICE DAILY. 60 tablet 6  . allopurinol (ZYLOPRIM) 300 MG tablet TAKE 1 TABLET DAILY (Patient taking differently: Take 300 mg by mouth daily. ) 90 tablet 1  . amLODIPine-Valsartan-HCTZ 10-160-25 MG TABS Take 25 mg by mouth daily.    Marland Kitchen aspirin EC 81 MG tablet Take 1 tablet (81 mg total) by mouth daily. 30 tablet 11  . bortezomib SQ (VELCADE) 3.5 MG SOLR Inject 1.2 mLs (3 mg total) into the skin every 14 (fourteen) days for 6 doses. 7.2 mL 12  . bumetanide (BUMEX) 0.5 MG tablet Take 0.5 mg by mouth daily.     . carvedilol (COREG) 25 MG tablet Take 1 tablet (25 mg total) by mouth 2 (two) times daily.    .  diazepam (VALIUM) 5 MG tablet Take 1 tablet (5 mg total) by mouth at bedtime. 30 tablet 5  . Docosanol (ABREVA) 10 % CREA Apply 1 application topically daily as needed (for nose).     Marland Kitchen docusate sodium (COLACE) 100 MG capsule Take 100 mg by mouth daily.     . fluconazole (DIFLUCAN) 150 MG tablet Take 150 mg by mouth every 3 (three) days.    . fluticasone (FLONASE) 50 MCG/ACT nasal spray Place 2 sprays into both nostrils daily. 16 g 0  . lenalidomide (REVLIMID) 10 MG capsule TAKE 1 CAPSULE BY MOUTH  DAILY FOR 21 DAYS ON,  THEN  7 DAYS OFF 21 capsule 1  . losartan (COZAAR) 100 MG tablet Take 100 mg by mouth daily.    . Magnesium 200 MG TABS Take 600 mg by mouth daily.    . magnesium oxide (MAG-OX) 400 (241.3 Mg) MG tablet Take 1 tablet (400 mg total) by mouth 3 (three) times daily. 90 tablet 3  . metFORMIN (GLUCOPHAGE) 500 MG tablet TAKE 1 TABLET BY MOUTH ONCE DAILY WITH BREAKFAST (Patient taking differently: Take 1,000 mg by mouth 2 (two) times daily with a meal. ) 90 tablet 0  . pantoprazole (PROTONIX) 40 MG tablet Take 1 tablet (40 mg total) by mouth daily. 90 tablet 0  . pregabalin (LYRICA) 200 MG capsule TAKE (1) CAPSULE BY MOUTH TWICE DAILY. (Patient taking differently: Take 200 mg by mouth daily. ) 60 capsule 4  . prochlorperazine (COMPAZINE) 10 MG tablet every 14 (fourteen) days    . Propylene Glycol (SYSTANE BALANCE) 0.6 % SOLN Apply 1 drop to eye daily as needed (dry eye).     . rosuvastatin (CRESTOR) 10 MG tablet Take 1 tablet (10 mg total) by mouth daily. 30 tablet 0  . TRULICITY 1.5 DG/3.8VF SOPN Inject 0.75 mg into the skin every Monday.     . valACYclovir (VALTREX) 1000 MG tablet TAKE 2 TABLETS NOW, THEN 2 TABLETS 12 HOURS LATER. 12 tablet 0  . albuterol (PROVENTIL HFA;VENTOLIN HFA) 108 (90 Base) MCG/ACT inhaler Inhale 2 puffs into the lungs every 4 (four) hours as needed for wheezing or shortness of breath. (Patient not taking: Reported on 02/09/2020) 1 Inhaler 3   No current  facility-administered medications for this visit.    ALLERGIES:  No Known Allergies  PHYSICAL EXAM:  Performance status (ECOG): 1 - Symptomatic but completely ambulatory  Vitals:   02/09/20 1044  BP: 128/86  Pulse: 61  Resp: 18  Temp: (!) 97.2 F (36.2 C)  SpO2: 96%   Wt Readings from Last 3 Encounters:  02/09/20 249 lb 12.8 oz (113.3 kg)  01/26/20 250 lb (113.4 kg)  01/19/20 250 lb 1.6 oz (113.4 kg)   Physical Exam Vitals reviewed.  Constitutional:      Appearance: Normal appearance. She is obese.  Cardiovascular:     Rate and Rhythm: Normal rate and regular rhythm.     Pulses: Normal pulses.     Heart sounds: Normal heart sounds.  Pulmonary:     Effort: Pulmonary effort is normal.     Breath sounds: Normal breath sounds.  Musculoskeletal:     Right lower leg: No edema.     Left lower leg: No edema.  Neurological:     Mental Status: She is alert and oriented to person, place, and time.     Cranial Nerves: Dysarthria (improving) present.  Psychiatric:        Mood and Affect: Mood normal.        Behavior: Behavior normal.     LABORATORY DATA:  I have reviewed the labs as listed.  CBC Latest Ref Rng & Units 02/02/2020 01/19/2020 12/31/2019  WBC 4.0 - 10.5 K/uL 3.3(L) 3.4(L) 2.6(L)  Hemoglobin 12.0 - 15.0 g/dL 11.3(L) 11.1(L) 10.9(L)  Hematocrit 36 - 46 % 34.9(L) 33.8(L) 32.3(L)  Platelets 150 - 400 K/uL 146(L) 161 174   CMP Latest Ref Rng & Units 02/02/2020 01/19/2020 12/31/2019  Glucose 70 - 99 mg/dL 104(H) 124(H) 102(H)  BUN 6 - 20 mg/dL 24(H) 24(H) 19  Creatinine 0.44 - 1.00 mg/dL 1.03(H) 1.09(H) 1.24(H)  Sodium 135 -  145 mmol/L 141 140 142  Potassium 3.5 - 5.1 mmol/L 3.7 4.2 4.7  Chloride 98 - 111 mmol/L 107 108 104  CO2 22 - 32 mmol/L _0 Calcium 8.9 - 10.3 mg/dL 9.1 9.5 9.8  Total Protein 6.5 - 8.1 g/dL 6.8 6.7 -  Total Bilirubin 0.3 - 1.2 mg/dL 0.6 0.5 -  Alkaline Phos 38 - 126 U/L 61 63 -  AST 15 - 41 U/L 19 15 -  ALT 0 - 44 U/L 18 13 -        Component Value Date/Time   RBC 3.68 (L) 02/02/2020 0908   MCV 94.8 02/02/2020 0908   MCV 92 12/31/2019 1126   MCH 30.7 02/02/2020 0908   MCHC 32.4 02/02/2020 0908   RDW 14.8 02/02/2020 0908   RDW 15.9 (H) 12/31/2019 1126   LYMPHSABS 1.0 02/02/2020 0908   LYMPHSABS 2.9 08/02/2015 1113   MONOABS 0.4 02/02/2020 0908   EOSABS 0.1 02/02/2020 0908   EOSABS 0.2 08/02/2015 1113   BASOSABS 0.0 02/02/2020 0908   BASOSABS 0.1 08/02/2015 1113   Lab Results  Component Value Date   LDH 122 02/02/2020   LDH 124 12/08/2019   LDH 108 11/09/2019   Lab Results  Component Value Date   TOTALPROTELP 6.4 02/02/2020   TOTALPROTELP 6.3 02/02/2020   ALBUMINELP 3.6 02/02/2020   A1GS 0.2 02/02/2020   A2GS 0.7 02/02/2020   BETS 1.0 02/02/2020   GAMS 0.9 02/02/2020   MSPIKE Not Observed 02/02/2020   SPEI Comment 02/02/2020    Lab Results  Component Value Date   KPAFRELGTCHN 28.7 (H) 02/02/2020   LAMBDASER 18.0 02/02/2020   KAPLAMBRATIO 1.59 02/02/2020    DIAGNOSTIC IMAGING:  I have independently reviewed the scans and discussed with the patient. No results found.   ASSESSMENT:  1. IgG lambda multiple myeloma, stage III, del 17 p: -4 cycles of RVD from 11/01/2017 through 01/14/2018. -Stem cell transplant on 02/20/2018 at [facility]. -Maintenance Velcade every 2 weeks and Revlimid 10 mg 3 weeks on/1 week off started on 07/29/2018. -Myeloma panel from 12/08/2018 shows M spike not observed. Kappa light chains of 32.1 with ratio of 1.64. Immunofixation was negative. 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  MRI of the brain on 12/27/2019 shows acute ischemic nonhemorrhagic left MCA territory infarct involving left parietal lobe, corresponding with perfusion deficit on CT scan angiogram.  No associated hemorrhage or mass-effect.  Additional few scattered punctate acute ischemic nonhemorrhagic infarcts involving bilateral frontal and parietal lobes as well as left cerebellum. -Last Velcade treatment was on  12/22/2019.  We have held Revlimid since then.  2.  CVA with aphasia: -We held her myeloma treatments since CVA with aphasia on 12/26/2019. -Her aphasia is improving.   PLAN:  1. IgG lambda multiple myeloma, stage III, del 17 p: -Myeloma labs on 02/02/2020 shows M spike was negative.  Kappa light chains are 28.7 with ratio of 1.59.  Immunofixation was normal.  Hemoglobin was 11.3. -Creatinine was 1.03 and calcium was normal. -I will reevaluate her in 4 weeks.  I plan to start Velcade maintenance at that time.  I will also plan to reach out to Dr. Laverta Baltimore.  2. Hypomagnesemia: -Continue magnesium supplements at home.  3. Bone strengthening agents: -Continue Zometa every 3 months.  4. Peripheral neuropathy: -Continue Lyrica 200 mg twice daily.  5. ID prophylaxis: -Continue acyclovir twice daily.  6. Diabetes: -Continue Metformin and Trulicity.  Orders placed this encounter:  No orders of the defined types  were placed in this encounter.    Derek Jack, MD [place] [phone]   I, Milinda Antis, am acting as a scribe for Dr. Sanda Linger.  I, Derek Jack MD, have reviewed the above documentation for accuracy and completeness, and I agree with the above.

## 2020-02-09 NOTE — Progress Notes (Signed)
Patient has been assessed, vital signs and labs have been reviewed by Dr. Delton Coombes. HOLD treatment today, will see her back in 3 weeks for recheck.

## 2020-02-10 ENCOUNTER — Other Ambulatory Visit: Payer: Self-pay

## 2020-02-10 ENCOUNTER — Ambulatory Visit (HOSPITAL_COMMUNITY): Payer: BC Managed Care – PPO | Admitting: Speech Pathology

## 2020-02-10 ENCOUNTER — Encounter (HOSPITAL_COMMUNITY): Payer: Self-pay | Admitting: Speech Pathology

## 2020-02-10 DIAGNOSIS — R4701 Aphasia: Secondary | ICD-10-CM

## 2020-02-10 DIAGNOSIS — R41841 Cognitive communication deficit: Secondary | ICD-10-CM

## 2020-02-10 NOTE — Therapy (Signed)
[place], [place], [address] Phone: [phone]   Fax:  [phone]  Speech Language Pathology Treatment  Patient Details  Name: Joanna Reid MRN: [id number] Date of Birth: 1966-01-24 Referring Provider (SLP): Lala Lund, MD   Encounter Date: 02/10/2020   End of Session - 02/10/20 1054    Visit Number 13    Number of Visits 16    Date for SLP Re-Evaluation 03/01/20    Authorization Type [employer] PPO   $30 copay, 60 combined visit limit, OOP max $5350   SLP Start Time 1601    SLP Stop Time  1120    SLP Time Calculation (min) 45 min    Activity Tolerance Patient tolerated treatment well           Past Medical History:  Diagnosis Date  . Acid reflux   . Allergic rhinitis   . Cancer ([place])    multiple myeloma  . Diabetes mellitus    type 2  . Gout   . Gout   . HBP (high blood pressure)   . History of kidney stones   . Migraines     Past Surgical History:  Procedure Laterality Date  . BREAST CYST EXCISION Left    2009 no visible scar on skin  . CESAREAN SECTION    . COLONOSCOPY WITH PROPOFOL N/A 12/25/2019   Procedure: COLONOSCOPY WITH PROPOFOL;  Surgeon: Rogene Houston, MD;  [facility];  Service: Endoscopy;  Laterality: N/A;  730  . EXTRACORPOREAL SHOCK WAVE LITHOTRIPSY Left 10/10/2017   Procedure: LEFT EXTRACORPOREAL SHOCK WAVE LITHOTRIPSY (ESWL);  Surgeon: Bjorn Loser, MD;  [facility];  Service: Urology;  Laterality: Left;  . EYE SURGERY    . HEMORRHOID SURGERY N/A 11/19/2012   Procedure: HEMORRHOIDECTOMY;  Surgeon: Jamesetta So, MD;  Location: [facility];  Service: General;  Laterality: N/A;  . kidney stones  1998  . LAPAROSCOPIC UNILATERAL SALPINGO OOPHERECTOMY  05/14/2012   Procedure: LAPAROSCOPIC UNILATERAL SALPINGO OOPHORECTOMY;  Surgeon: [name], MD;  Location: [facility];  Service: Gynecology;  Laterality: Right;  laparoscopic right salpingo-oophorectomy  .  PARTIAL HYSTERECTOMY    . POLYPECTOMY  12/25/2019   Procedure: POLYPECTOMY;  Surgeon: Rogene Houston, MD;  [facility];  Service: Endoscopy;;  . TONSILECTOMY, ADENOIDECTOMY, BILATERAL MYRINGOTOMY AND TUBES    . VESICOVAGINAL FISTULA CLOSURE W/ TAH      There were no vitals filed for this visit.   Subjective Assessment - 02/10/20 1051    Subjective "I'm getting my COVID vaccine today."    Currently in Pain? No/denies             ADULT SLP TREATMENT - 02/10/20 1052      General Information   Behavior/Cognition Alert;Cooperative;Pleasant mood    Patient Positioning Upright in chair    Oral care provided N/A    HPI Pt admitted on 12/26/19 for stroke. TPA note given, outside of time window. MRI 12/27/19 acute ischemic nonhemorrhagic left MCA territory infarct involving the left parietal lobe, few scattered punctate acute ischemic nonhemorrhagic infarcts bilateral frontal and parietal lobes as wellas the left cerebellum, small remote right cerebellar infarcts. PMH Essential hypertension,  obesity, Multiple myeloma ([place]), DM, Anemia, COVID. Pt is referred for outpatient SLP evaluation and treatment for aphasia from acute CVA.      Treatment Provided   Treatment provided Cognitive-Linquistic      Pain Assessment   Pain Assessment No/denies  pain      Cognitive-Linquistic Treatment   Treatment focused on Aphasia;Patient/family/caregiver education    Skilled Treatment Answering Wh-questions, use of communication support page, implementing word finding and oral reading strategies, use of orthographic cues, patient centered approach, reading comprehension, self evaluation, and oral reading tasks      Assessment / Recommendations / [place] with current plan of care      Progression Toward Goals   Progression toward goals Progressing toward goals              SLP Short Term Goals - 02/10/20 1055      SLP SHORT TERM GOAL #1   Title Pt will implement  word-finding strategies with 80% accuracy when unable to verbalize desired word in conversation/functional tasks with mod assist.    Baseline ~60%    Time 4    Period Weeks    Status Achieved    Target Date 01/29/20      SLP SHORT TERM GOAL #2   Title Pt will describe objects and pictures by providing at least three salient features as judged by clinician with 90% acc when provided min cues.    Baseline 66%    Time 4    Period Weeks    Status Achieved    Target Date 01/29/20      SLP SHORT TERM GOAL #3   Title Pt will increase divergent naming to 7+ items per concrete category with min cues.    Baseline 5    Time 4    Period Weeks    Status Achieved    Target Date 01/29/20      SLP SHORT TERM GOAL #4   Title Pt will complete basic sentence level reading comprehension tasks with 80% acc and min cues for error awareness.    Baseline 65% sentence level with multiple choice response    Time 4    Period Weeks    Status Achieved    Target Date 01/29/20      SLP SHORT TERM GOAL #5   Title Pt will be able to write personal/bio information with 90% acc when provided mod cues    Baseline Able to copy name only    Time 4    Period Weeks    Status Achieved    Target Date 01/29/20      SLP SHORT TERM GOAL #6   Title Pt will respond to open-ended questions using a complete sentence with 80% acc and min cues.    Baseline telegraphic responses    Time 4    Period Weeks    Status On-going    Target Date 02/29/20      SLP SHORT TERM GOAL #7   Title Pt will implement word-finding strategies with 90% accuracy when unable to verbalize desired word in conversation/functional tasks with min assist.    Baseline 80% mi/mod assist    Time 4    Period Weeks    Status On-going    Target Date 03/01/20      SLP SHORT TERM GOAL #8   Title Pt will describe objects and pictures without naming by providing at least 4 salient features as judged by clinician with 90% acc when provided indirect  cues.    Baseline 80%    Time 4    Period Weeks    Status On-going    Target Date 03/01/20      SLP SHORT TERM GOAL  #9   TITLE Pt will  complete moderately complex sentence and paragraph reading comprehension tasks with 90% acc with min assist.    Baseline sentence 80%    Time 4    Period Weeks    Status On-going    Target Date 03/01/20            SLP Long Term Goals - 02/10/20 1055      SLP LONG TERM GOAL #1   Title Pt will communicate moderately complex thoughts and feelings to [facility] with use of strategies as needed.    Baseline mod assist    Time 8    Period Weeks    Status On-going            Plan - 02/10/20 1054    Clinical Impression Statement Pt was accompanied to therapy today by her spouse. She completed all homework with 100% acc. Pt orally read short sentences aloud and provided additional sentence to complete the story with 100% acc for word choice and 90% for fluency. SLP cued Pt to rehearse the sentences either silently/whisper or aloud, which improved fluency and naturalness of speech. She was audio recorded which initially made her feel nervous, however provided good feedback for her to see that rehearsal helped fluency. She completed moderately complex word description task with 95% acc with min cues. Pt continues to make excellent progress toward goals. Continue with plan of care.    Speech Therapy Frequency 2x / week    Duration 4 weeks   8 weeks   Treatment/Interventions SLP instruction and feedback;Internal/external aids;Compensatory strategies;Compensatory techniques;Patient/family education;Language facilitation;Cueing hierarchy;Multimodal communcation approach    Potential to Achieve Goals Good    SLP Home Exercise Plan Pt will complete HEP as assigned to facilitate carryover of treatment strategies and techniques in home environment with assist from spouse as needed.    Consulted and Agree with Plan of Care Patient;Family member/caregiver    Family  Member Consulted Husband, [name]           Patient will benefit from skilled therapeutic intervention in order to improve the following deficits and impairments:   Aphasia  Cognitive communication deficit    Problem List Patient Active Problem List   Diagnosis Date Noted  . 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 H/o COVID-19--- was Positive 09/17/2019, Negative 12/23/19 AND also Neg on  12/26/19 12/26/2019  . Acute bronchiolitis due to respiratory syncytial virus (RSV) 08/11/2018  . Hypomagnesemia 08/10/2018  . Hyperlipidemia 08/09/2018  . Anemia 08/09/2018  . Leukopenia 08/09/2018  . Hypokalemia 08/09/2018  . Hypocalcemia 08/09/2018  . Lactic acidosis 08/09/2018  . Sepsis ([place]) 04/25/2018  . Goals of care, counseling/discussion 11/01/2017  . Multiple myeloma ([place]) 10/29/2017  . Hypercalcemia 10/29/2017  . Type 2 diabetes mellitus without complication, without long-term current use of insulin ([place]) 01/27/2016  . Morbid obesity due to excess calories ([place]) 01/27/2016  . Anxiety as acute reaction to exceptional stress 01/13/2015  . Attention deficit hyperactivity disorder (ADHD), 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  . Gout 01/20/2014  . GERD (gastroesophageal reflux disease) 01/20/2014  . Essential hypertension, benign 01/20/2014  . Plantar fasciitis of left foot 09/19/2011   Thank you,  Genene Churn, [place]  [facility] 02/10/2020, 10:58 AM  [place] [address] [place], [place], [address] Phone: [phone]   Fax:  [phone]   Name: CHARISA TWITTY MRN: [id number] Date of Birth: [birth year]

## 2020-02-11 ENCOUNTER — Other Ambulatory Visit (HOSPITAL_COMMUNITY): Payer: Self-pay | Admitting: *Deleted

## 2020-02-11 DIAGNOSIS — C9000 Multiple myeloma not having achieved remission: Secondary | ICD-10-CM

## 2020-02-15 ENCOUNTER — Encounter (HOSPITAL_COMMUNITY): Payer: BC Managed Care – PPO | Admitting: Speech Pathology

## 2020-02-16 ENCOUNTER — Ambulatory Visit (HOSPITAL_COMMUNITY): Payer: BC Managed Care – PPO

## 2020-02-16 ENCOUNTER — Other Ambulatory Visit: Payer: Self-pay | Admitting: *Deleted

## 2020-02-16 ENCOUNTER — Other Ambulatory Visit (HOSPITAL_COMMUNITY): Payer: BC Managed Care – PPO

## 2020-02-16 ENCOUNTER — Encounter: Payer: Self-pay | Admitting: *Deleted

## 2020-02-16 NOTE — Patient Outreach (Signed)
[facility]) Care Management  [place]  02/16/2020   Joanna Reid 11/07/1965 [id number]  Subjective: Received voicemail message from patient's husband Joanna Reid), states he is returning call for his wife Joanna Reid), requested call back to he, or his wife. Telephone call to patient's home / mobile number, spoke with patient, and HIPAA verified.  Discussed [facility] Care Management BCBS Commercial EMMI Stroke Red Flag Alert follow up, patient voiced understanding, and is in agreement to follow up.  States she remembers receiving EMMI automated calls,  has attended all follow up appointments, and all appointments have went well.  Verified patient had the following appointments: on 02/16/2020 with nephrologist, on 02/09/2020 with oncologist, on 01/26/2020 with neurologist, on 12/31/2019 with cardiologist, on 12/31/2019 with new primary MD (Dr. Elvia Collum) on 12/31/2019, on 12/30/2019 with outpatient rehab.   Patient states she is doing good, everything is going great, blood pressure is good, blood sugars are good, was 89 today, and last A1C was 5.2.   States she is now able to drive and providers are discontinuing medications as needed.   Patient states she is able to manage self care and has assistance as needed.  States her husband works third shift, family has been taking turns staying with her, and has great family support.  States her speech is improving daily, cancer is in remission, chemo is on hold, and it may resume 1st week in September 2021.   States her next oncologist appointment is on 03/09/2020.   States she is eating healthy, working on weight loss, and previously  planned bariatric surgery is indefinitely on hold due to cancer history.    States per her nephrologist no longer has kidney stones and no kidney issues.    Patient voices understanding of medical diagnosis and treatment plan.  States she received the 1st Covid vaccine [employer]) on 02/10/2020.    Discussed  Advanced Directives, advised of [place] [facility]  Advanced Directives packet resource, patient voices understanding, and is in agreement to be sent packet at this time.  States she has general questions regarding her current documents and requested additional resource information.  RNCM advised of [employer] document scanning  system for Advanced Directives documents via local providers office or hospitals, patient  voices understanding, and she will discuss accessing through patient's primary provider if needed in the future.  Discussed Environmental consultant (DPR)  for [employer], patient voiced understanding, gave verbal consent for this RNCM to speak with her husband Joanna Reid) regarding healthcare needs as needed, and states she will follow up at next provider visit to update DPR.  States she is accessing her BCBS benefits as needed via member services number on back of card.  Patient states she does not have any  EMMI follow up, care coordination, care management, disease monitoring, transportation, or pharmacy needs at this time.  States she is very appreciative of the follow up, is in agreement  to receive 1 additional follow up call to assess for further CM needs.  States she has this RNCM's and [place] Management contact information, will call if assistance needed prior to next patent outreach.       Objective: Per KPN (Knowledge Performance Now, point of care tool) and chart review,patient hospitalized 12/26/2019 - 12/28/2019 forStroke (cerebrum),confusion, speech deficits primarily expressive aphasia, . Patient also has a history of hypertension, diabetes, Multiple myeloma([facility] and currently onMaintenance Velcade every 2 weeks and Revlimid 10 mg  3 weeks on/1 week off started on 07/29/2018) , Anemia, COVID 19 (was Positive 09/17/2019, Negative 12/23/19,Negativeon 12/26/19), UTI, gout,kidney stones,Pancytopenia with  moderatethrombocytopenia, and Migraines.    Encounter Medications:  Outpatient Encounter Medications as of 02/16/2020  Medication Sig Note   acyclovir (ZOVIRAX) 400 MG tablet TAKE (1) TABLET BY MOUTH TWICE DAILY.    allopurinol (ZYLOPRIM) 300 MG tablet TAKE 1 TABLET DAILY (Patient taking differently: Take 300 mg by mouth daily. )    amLODIPine-Valsartan-HCTZ 10-160-25 MG TABS Take 25 mg by mouth daily.    aspirin EC 81 MG tablet Take 1 tablet (81 mg total) by mouth daily.    bumetanide (BUMEX) 0.5 MG tablet Take 0.5 mg by mouth daily.  02/16/2020: States take every other day per MD order.    carvedilol (COREG) 25 MG tablet Take 1 tablet (25 mg total) by mouth 2 (two) times daily.    diazepam (VALIUM) 5 MG tablet Take 1 tablet (5 mg total) by mouth at bedtime. 02/16/2020: States she only takes as needed.    Docosanol (ABREVA) 10 % CREA Apply 1 application topically daily as needed (for nose).     docusate sodium (COLACE) 100 MG capsule Take 100 mg by mouth daily.     fluticasone (FLONASE) 50 MCG/ACT nasal spray Place 2 sprays into both nostrils daily.    losartan (COZAAR) 100 MG tablet Take 100 mg by mouth daily.    magnesium oxide (MAG-OX) 400 (241.3 Mg) MG tablet Take 1 tablet (400 mg total) by mouth 3 (three) times daily.    metFORMIN (GLUCOPHAGE) 500 MG tablet TAKE 1 TABLET BY MOUTH ONCE DAILY WITH BREAKFAST (Patient taking differently: Take 1,000 mg by mouth 2 (two) times daily with a meal. ) 02/16/2020: States takes 572m in am and in pm with meals.    pantoprazole (PROTONIX) 40 MG tablet Take 1 tablet (40 mg total) by mouth daily.    pregabalin (LYRICA) 200 MG capsule TAKE (1) CAPSULE BY MOUTH TWICE DAILY. (Patient taking differently: Take 200 mg by mouth daily. ) 02/16/2020: States takes 1 capsule twice a day   Propylene Glycol (SYSTANE BALANCE) 0.6 % SOLN Apply 1 drop to eye daily as needed (dry eye).     rosuvastatin (CRESTOR) 10 MG tablet Take 1 tablet (10 mg total) by  mouth daily.    TRULICITY 1.5 MYB/6.3SLSOPN Inject 0.75 mg into the skin every Monday.     albuterol (PROVENTIL HFA;VENTOLIN HFA) 108 (90 Base) MCG/ACT inhaler Inhale 2 puffs into the lungs every 4 (four) hours as needed for wheezing or shortness of breath. (Patient not taking: Reported on 02/09/2020) 02/16/2020: States has not needed.    bortezomib SQ (VELCADE) 3.5 MG SOLR Inject 1.2 mLs (3 mg total) into the skin every 14 (fourteen) days for 6 doses. (Patient not taking: Reported on 02/16/2020) 02/16/2020: States is on hold based on blood work every 2 weeks.    fluconazole (DIFLUCAN) 150 MG tablet Take 150 mg by mouth every 3 (three) days. (Patient not taking: Reported on 02/16/2020) 02/16/2020: States has completed treatment.    lenalidomide (REVLIMID) 10 MG capsule TAKE 1 CAPSULE BY MOUTH  DAILY FOR 21 DAYS ON, THEN  7 DAYS OFF (Patient not taking: Reported on 02/16/2020) 02/16/2020: States on hold until resumed by MD order.    Magnesium 200 MG TABS Take 600 mg by mouth daily. (Patient not taking: Reported on 02/16/2020) 02/16/2020: States per MD order not taking.   prochlorperazine (COMPAZINE) 10 MG tablet every 14 (fourteen) days (  Patient not taking: Reported on 02/16/2020) 02/16/2020: States on hold with chemo.    valACYclovir (VALTREX) 1000 MG tablet TAKE 2 TABLETS NOW, THEN 2 TABLETS 12 HOURS LATER. (Patient not taking: Reported on 02/16/2020) 02/16/2020: States has not needed.    No facility-administered encounter medications on file as of 02/16/2020.    Functional Status:  No flowsheet data found.  Fall/Depression Screening: Fall Risk  11/09/2019 08/12/2017  Falls in the past year? 0 Yes  Number falls in past yr: 0 2 or more  Injury with Fall? 0 No  Risk for fall due to : - History of fall(s)  Follow up Falls evaluation completed -   PHQ 2/9 Scores 02/16/2020 11/09/2019 10/13/2019 08/12/2017 09/14/2016  PHQ - 2 Score 0 0 0 0 0  PHQ- 9 Score - 0 1 - -    Assessment: Received BCBS Commercial EMMI  Stroke Google Alert on 01/12/2020. Red Flag Alert Trigger, Day #13, patient answered no to the following question: Went to follow-up appointment? EMMI follow up completed and will follow up to assess further care management needs.     Plan: RNCM will send patient successful outreach letter, [facility] pamphlet, magnet, and Advanced Directives packet, per patient's request. RNCM will call patient for telephone outreach attempt, within 30  business days, to assess for further CM needs, and proceed with case closure, after 4th unsuccessful outreach.        Zeppelin Commisso H. Annia Friendly, BSN, [place] Management [facility], [place] Telephonic CM Phone: [phone] Fax: [phone]

## 2020-02-17 ENCOUNTER — Ambulatory Visit (HOSPITAL_COMMUNITY): Payer: BC Managed Care – PPO | Admitting: Speech Pathology

## 2020-02-17 ENCOUNTER — Ambulatory Visit: Payer: Self-pay | Admitting: *Deleted

## 2020-02-18 ENCOUNTER — Other Ambulatory Visit: Payer: Self-pay

## 2020-02-18 ENCOUNTER — Encounter (HOSPITAL_COMMUNITY): Payer: Self-pay | Admitting: Speech Pathology

## 2020-02-18 ENCOUNTER — Ambulatory Visit (HOSPITAL_COMMUNITY): Payer: BC Managed Care – PPO | Admitting: Speech Pathology

## 2020-02-18 DIAGNOSIS — R41841 Cognitive communication deficit: Secondary | ICD-10-CM

## 2020-02-18 DIAGNOSIS — R4701 Aphasia: Secondary | ICD-10-CM | POA: Diagnosis not present

## 2020-02-18 NOTE — Therapy (Signed)
[place], [place], [address] Phone: [phone]   Fax:  [phone]  Speech Language Pathology Treatment  Patient Details  Name: Joanna Reid MRN: [id number] Date of Birth: [birth year] Referring Provider (SLP): Lala Lund, MD   Encounter Date: 02/18/2020   End of Session - 02/18/20 1035    Visit Number 14    Number of Visits 16    Date for SLP Re-Evaluation 03/01/20    Authorization Type BCBS Comm PPO   $30 copay, 60 combined visit limit, OOP max $[id number]   SLP Start Time 1030    SLP Stop Time  1115    SLP Time Calculation (min) 45 min    Activity Tolerance Patient tolerated treatment well           Past Medical History:  Diagnosis Date  . Acid reflux   . Allergic rhinitis   . Cancer ([place])    multiple myeloma  . Diabetes mellitus    type 2  . Gout   . Gout   . HBP (high blood pressure)   . History of kidney stones   . Migraines     Past Surgical History:  Procedure Laterality Date  . BREAST CYST EXCISION Left    2009 no visible scar on skin  . CESAREAN SECTION    . COLONOSCOPY WITH PROPOFOL N/A 12/25/2019   Procedure: COLONOSCOPY WITH PROPOFOL;  Surgeon: Rogene Houston, MD;  Location: [facility];  Service: Endoscopy;  Laterality: N/A;  730  . EXTRACORPOREAL SHOCK WAVE LITHOTRIPSY Left 10/10/2017   Procedure: LEFT EXTRACORPOREAL SHOCK WAVE LITHOTRIPSY (ESWL);  Surgeon: Bjorn Loser, MD;  Location: [facility];  Service: Urology;  Laterality: Left;  . EYE SURGERY    . HEMORRHOID SURGERY N/A 11/19/2012   Procedure: HEMORRHOIDECTOMY;  Surgeon: Jamesetta So, MD;  Location: [facility] OR[facility];  Service: General;  Laterality: N/A;  . kidney stones  1998  . LAPAROSCOPIC UNILATERAL SALPINGO OOPHERECTOMY  05/14/2012   Procedure: LAPAROSCOPIC UNILATERAL SALPINGO OOPHORECTOMY;  Surgeon: [name], MD;  Location: [facility];  Service: Gynecology;  Laterality: Right;  laparoscopic right salpingo-oophorectomy  .  PARTIAL HYSTERECTOMY    . POLYPECTOMY  12/25/2019   Procedure: POLYPECTOMY;  Surgeon: Rogene Houston, MD;  Location: [facility];  Service: Endoscopy;;  . TONSILECTOMY, ADENOIDECTOMY, BILATERAL MYRINGOTOMY AND TUBES    . VESICOVAGINAL FISTULA CLOSURE W/ TAH      There were no vitals filed for this visit.   Subjective Assessment - 02/18/20 1034    Subjective "I drove myself."    Currently in Pain? No/denies             ADULT SLP TREATMENT - 02/18/20 1034      General Information   Behavior/Cognition Alert;Cooperative;Pleasant mood    Patient Positioning Upright in chair    Oral care provided N/A    HPI Pt admitted on 12/26/19 for stroke. TPA note given, outside of time window. MRI 12/27/19 acute ischemic nonhemorrhagic left MCA territory infarct involving the left parietal lobe, few scattered punctate acute ischemic nonhemorrhagic infarcts bilateral frontal and parietal lobes as wellas the left cerebellum, small remote right cerebellar infarcts. PMH Essential hypertension,  obesity, Multiple myeloma ([place]), DM, Anemia, COVID. Pt is referred for outpatient SLP evaluation and treatment for aphasia from acute CVA.      Treatment Provided   Treatment provided Cognitive-Linquistic      Pain Assessment   Pain Assessment No/denies pain  Cognitive-Linquistic Treatment   Treatment focused on Aphasia;Patient/family/caregiver education    Skilled Treatment Answering Wh-questions, use of communication support page, implementing word finding and oral reading strategies,patient centered approach, reading comprehension, self evaluation, and oral reading tasks      Assessment / Recommendations / [place] with current plan of care      Progression Toward Goals   Progression toward goals Progressing toward goals              SLP Short Term Goals - 02/18/20 1037      SLP SHORT TERM GOAL #1   Title Pt will implement word-finding strategies with 80% accuracy when unable  to verbalize desired word in conversation/functional tasks with mod assist.    Baseline ~60%    Time 4    Period Weeks    Status Achieved    Target Date 01/29/20      SLP SHORT TERM GOAL #2   Title Pt will describe objects and pictures by providing at least three salient features as judged by clinician with 90% acc when provided min cues.    Baseline 66%    Time 4    Period Weeks    Status Achieved    Target Date 01/29/20      SLP SHORT TERM GOAL #3   Title Pt will increase divergent naming to 7+ items per concrete category with min cues.    Baseline 5    Time 4    Period Weeks    Status Achieved    Target Date 01/29/20      SLP SHORT TERM GOAL #4   Title Pt will complete basic sentence level reading comprehension tasks with 80% acc and min cues for error awareness.    Baseline 65% sentence level with multiple choice response    Time 4    Period Weeks    Status Achieved    Target Date 01/29/20      SLP SHORT TERM GOAL #5   Title Pt will be able to write personal/bio information with 90% acc when provided mod cues    Baseline Able to copy name only    Time 4    Period Weeks    Status Achieved    Target Date 01/29/20      SLP SHORT TERM GOAL #6   Title Pt will respond to open-ended questions using a complete sentence with 80% acc and min cues.    Baseline telegraphic responses    Time 4    Period Weeks    Status On-going    Target Date 02/29/20      SLP SHORT TERM GOAL #7   Title Pt will implement word-finding strategies with 90% accuracy when unable to verbalize desired word in conversation/functional tasks with min assist.    Baseline 80% mi/mod assist    Time 4    Period Weeks    Status On-going    Target Date 03/01/20      SLP SHORT TERM GOAL #8   Title Pt will describe objects and pictures without naming by providing at least 4 salient features as judged by clinician with 90% acc when provided indirect cues.    Baseline 80%    Time 4    Period Weeks     Status On-going    Target Date 03/01/20      SLP SHORT TERM GOAL  #9   TITLE Pt will complete moderately complex sentence and paragraph reading comprehension tasks with 90%  acc with min assist.    Baseline sentence 80%    Time 4    Period Weeks    Status On-going    Target Date 03/01/20            SLP Long Term Goals - 02/18/20 1037      SLP LONG TERM GOAL #1   Title Pt will communicate moderately complex thoughts and feelings to [facility] with use of strategies as needed.    Baseline mod assist    Time 8    Period Weeks    Status On-going            Plan - 02/18/20 1036    Clinical Impression Statement Pt drove herself to therapy today. She completed all homework with 100% acc (paragraph reading comprehension).  She completed moderately complex word description task with 100% acc with min cues during un-timed task. Pt had a little more trouble completing a similar task when timed. Pt participated in Pt selected conversation for 20 minutes with independent use of word finding strategies and self corrections. Pt continues to make excellent progress toward goals. Continue with plan of care.    Speech Therapy Frequency 2x / week    Duration 4 weeks   8 weeks   Treatment/Interventions SLP instruction and feedback;Internal/external aids;Compensatory strategies;Compensatory techniques;Patient/family education;Language facilitation;Cueing hierarchy;Multimodal communcation approach    Potential to Achieve Goals Good    SLP Home Exercise Plan Pt will complete HEP as assigned to facilitate carryover of treatment strategies and techniques in home environment with assist from spouse as needed.    Consulted and Agree with Plan of Care Patient;Family member/caregiver    Family Member Consulted Husband, [name]           Patient will benefit from skilled therapeutic intervention in order to improve the following deficits and impairments:   Aphasia  Cognitive communication  deficit    Problem List Patient Active Problem List   Diagnosis Date Noted  . 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 H/o COVID-19--- was Positive 09/17/2019, Negative 12/23/19 AND also Neg on  12/26/19 12/26/2019  . Acute bronchiolitis due to respiratory syncytial virus (RSV) 08/11/2018  . Hypomagnesemia 08/10/2018  . Hyperlipidemia 08/09/2018  . Anemia 08/09/2018  . Leukopenia 08/09/2018  . Hypokalemia 08/09/2018  . Hypocalcemia 08/09/2018  . Lactic acidosis 08/09/2018  . Sepsis ([place]) 04/25/2018  . Goals of care, counseling/discussion 11/01/2017  . Multiple myeloma ([place]) 10/29/2017  . Hypercalcemia 10/29/2017  . Type 2 diabetes mellitus without complication, without long-term current use of insulin ([place]) 01/27/2016  . Morbid obesity due to excess calories ([place]) 01/27/2016  . Anxiety as acute reaction to exceptional stress 01/13/2015  . Attention deficit hyperactivity disorder (ADHD), 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  . Gout 01/20/2014  . GERD (gastroesophageal reflux disease) 01/20/2014  . Essential hypertension, benign 01/20/2014  . Plantar fasciitis of left foot 09/19/2011   Thank you,  Genene Churn, [place]  [facility] 02/18/2020, 10:38 AM  [place] [address] [place], [place], [address] Phone: [phone]   Fax:  [phone]   Name: Joanna Reid MRN: [id number] Date of Birth: [birth year]

## 2020-02-22 ENCOUNTER — Ambulatory Visit (HOSPITAL_COMMUNITY): Payer: BC Managed Care – PPO | Admitting: Speech Pathology

## 2020-02-22 ENCOUNTER — Other Ambulatory Visit: Payer: Self-pay

## 2020-02-22 ENCOUNTER — Encounter (HOSPITAL_COMMUNITY): Payer: Self-pay | Admitting: Speech Pathology

## 2020-02-22 DIAGNOSIS — R4701 Aphasia: Secondary | ICD-10-CM

## 2020-02-22 DIAGNOSIS — R41841 Cognitive communication deficit: Secondary | ICD-10-CM

## 2020-02-22 NOTE — Therapy (Signed)
[place], [place], [address] Phone: [phone]   Fax:  [phone]  Speech Language Pathology Treatment  Patient Details  Name: Joanna Reid MRN: [id number] Date of Birth: Sep 14, 1965 Referring Provider (SLP): Lala Lund, MD   Encounter Date: 02/22/2020   End of Session - 02/22/20 1035    Visit Number 15    Number of Visits 17    Date for SLP Re-Evaluation 03/01/20    Authorization Type [employer] PPO   $30 copay, 60 combined visit limit, OOP max $5350   SLP Start Time 1030    SLP Stop Time  1115    SLP Time Calculation (min) 45 min    Activity Tolerance Patient tolerated treatment well           Past Medical History:  Diagnosis Date  . Acid reflux   . Allergic rhinitis   . Cancer ([place])    multiple myeloma  . Diabetes mellitus    type 2  . Gout   . Gout   . HBP (high blood pressure)   . History of kidney stones   . Migraines     Past Surgical History:  Procedure Laterality Date  . BREAST CYST EXCISION Left    2009 no visible scar on skin  . CESAREAN SECTION    . COLONOSCOPY WITH PROPOFOL N/A 12/25/2019   Procedure: COLONOSCOPY WITH PROPOFOL;  Surgeon: Rogene Houston, MD;  [facility];  Service: Endoscopy;  Laterality: N/A;  730  . EXTRACORPOREAL SHOCK WAVE LITHOTRIPSY Left 10/10/2017   Procedure: LEFT EXTRACORPOREAL SHOCK WAVE LITHOTRIPSY (ESWL);  Surgeon: Bjorn Loser, MD;  Location: [facility];  Service: Urology;  Laterality: Left;  . EYE SURGERY    . HEMORRHOID SURGERY N/A 11/19/2012   Procedure: HEMORRHOIDECTOMY;  Surgeon: Jamesetta So, MD;  Location: [facility];  Service: General;  Laterality: N/A;  . kidney stones  1998  . LAPAROSCOPIC UNILATERAL SALPINGO OOPHERECTOMY  05/14/2012   Procedure: LAPAROSCOPIC UNILATERAL SALPINGO OOPHORECTOMY;  Surgeon: [name], MD;  Location: [facility];  Service: Gynecology;  Laterality: Right;  laparoscopic right salpingo-oophorectomy  .  PARTIAL HYSTERECTOMY    . POLYPECTOMY  12/25/2019   Procedure: POLYPECTOMY;  Surgeon: Rogene Houston, MD;  [facility];  Service: Endoscopy;;  . TONSILECTOMY, ADENOIDECTOMY, BILATERAL MYRINGOTOMY AND TUBES    . VESICOVAGINAL FISTULA CLOSURE W/ TAH      There were no vitals filed for this visit.   Subjective Assessment - 02/22/20 1033    Subjective "I played Heads Up with my grandkids."    Patient is accompained by: Family member    Currently in Pain? No/denies            ADULT SLP TREATMENT - 02/22/20 1034      General Information   Behavior/Cognition Alert;Cooperative;Pleasant mood    Patient Positioning Upright in chair    Oral care provided N/A    HPI Pt admitted on 12/26/19 for stroke. TPA note given, outside of time window. MRI 12/27/19 acute ischemic nonhemorrhagic left MCA territory infarct involving the left parietal lobe, few scattered punctate acute ischemic nonhemorrhagic infarcts bilateral frontal and parietal lobes as wellas the left cerebellum, small remote right cerebellar infarcts. PMH Essential hypertension,  obesity, Multiple myeloma ([place]), DM, Anemia, COVID. Pt is referred for outpatient SLP evaluation and treatment for aphasia from acute CVA.      Treatment Provided   Treatment provided Cognitive-Linquistic  Pain Assessment   Pain Assessment No/denies pain      Cognitive-Linquistic Treatment   Treatment focused on Aphasia;Patient/family/caregiver education    Skilled Treatment Answering Wh-questions, use of communication support page, implementing word finding and oral reading strategies, use of orthographic cues, patient centered approach, reading comprehension, self evaluation, and oral reading tasks      Assessment / Recommendations / [place] with current plan of care      Progression Toward Goals   Progression toward goals Progressing toward goals              SLP Short Term Goals - 02/22/20 1038      SLP SHORT TERM  GOAL #1   Title Pt will implement word-finding strategies with 80% accuracy when unable to verbalize desired word in conversation/functional tasks with mod assist.    Baseline ~60%    Time 4    Period Weeks    Status Achieved    Target Date 01/29/20      SLP SHORT TERM GOAL #2   Title Pt will describe objects and pictures by providing at least three salient features as judged by clinician with 90% acc when provided min cues.    Baseline 66%    Time 4    Period Weeks    Status Achieved    Target Date 01/29/20      SLP SHORT TERM GOAL #3   Title Pt will increase divergent naming to 7+ items per concrete category with min cues.    Baseline 5    Time 4    Period Weeks    Status Achieved    Target Date 01/29/20      SLP SHORT TERM GOAL #4   Title Pt will complete basic sentence level reading comprehension tasks with 80% acc and min cues for error awareness.    Baseline 65% sentence level with multiple choice response    Time 4    Period Weeks    Status Achieved    Target Date 01/29/20      SLP SHORT TERM GOAL #5   Title Pt will be able to write personal/bio information with 90% acc when provided mod cues    Baseline Able to copy name only    Time 4    Period Weeks    Status Achieved    Target Date 01/29/20      SLP SHORT TERM GOAL #6   Title Pt will respond to open-ended questions using a complete sentence with 80% acc and min cues.    Baseline telegraphic responses    Time 4    Period Weeks    Status On-going    Target Date 02/29/20      SLP SHORT TERM GOAL #7   Title Pt will implement word-finding strategies with 90% accuracy when unable to verbalize desired word in conversation/functional tasks with min assist.    Baseline 80% mi/mod assist    Time 4    Period Weeks    Status On-going    Target Date 03/01/20      SLP SHORT TERM GOAL #8   Title Pt will describe objects and pictures without naming by providing at least 4 salient features as judged by clinician  with 90% acc when provided indirect cues.    Baseline 80%    Time 4    Period Weeks    Status On-going    Target Date 03/01/20      SLP SHORT TERM GOAL  #  9   TITLE Pt will complete moderately complex sentence and paragraph reading comprehension tasks with 90% acc with min assist.    Baseline sentence 80%    Time 4    Period Weeks    Status On-going    Target Date 03/01/20            SLP Long Term Goals - 02/22/20 1039      SLP LONG TERM GOAL #1   Title Pt will communicate moderately complex thoughts and feelings to [facility] with use of strategies as needed.    Baseline mod assist    Time 8    Period Weeks    Status On-going            Plan - 02/22/20 1038    Clinical Impression Statement Pt accompanied to SLP session by her husband. She forgot her notebook, but completed homework with grandchildren (played Heads Up). In session, she participated in Pt selected conversation regarding family life. Pt demonstrates increased fluency, but still exhibits some hesitations and reduced naturalness of speech. She uses word finding strategies independently. During oral reading of multisyllabic words, Pt required mi/mod cues for 86% acc. When SLP read aloud the same list and Pt asked to repeat, she did so with 100% acc wih rare min cueing. Pt continues to make excellent progress toward goals. Continue with plan of care.    Speech Therapy Frequency 2x / week    Duration 4 weeks   8 weeks   Treatment/Interventions SLP instruction and feedback;Internal/external aids;Compensatory strategies;Compensatory techniques;Patient/family education;Language facilitation;Cueing hierarchy;Multimodal communcation approach    Potential to Achieve Goals Good    SLP Home Exercise Plan Pt will complete HEP as assigned to facilitate carryover of treatment strategies and techniques in home environment with assist from spouse as needed.    Consulted and Agree with Plan of Care Patient;Family member/caregiver     Family Member Consulted Husband, [name]           Patient will benefit from skilled therapeutic intervention in order to improve the following deficits and impairments:   Aphasia  Cognitive communication deficit    Problem List Patient Active Problem List   Diagnosis Date Noted  . 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 H/o COVID-19--- was Positive 09/17/2019, Negative 12/23/19 AND also Neg on  12/26/19 12/26/2019  . Acute bronchiolitis due to respiratory syncytial virus (RSV) 08/11/2018  . Hypomagnesemia 08/10/2018  . Hyperlipidemia 08/09/2018  . Anemia 08/09/2018  . Leukopenia 08/09/2018  . Hypokalemia 08/09/2018  . Hypocalcemia 08/09/2018  . Lactic acidosis 08/09/2018  . Sepsis ([place]) 04/25/2018  . Goals of care, counseling/discussion 11/01/2017  . Multiple myeloma ([place]) 10/29/2017  . Hypercalcemia 10/29/2017  . Type 2 diabetes mellitus without complication, without long-term current use of insulin ([place]) 01/27/2016  . Morbid obesity due to excess calories ([place]) 01/27/2016  . Anxiety as acute reaction to exceptional stress 01/13/2015  . Attention deficit hyperactivity disorder (ADHD), 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  . Gout 01/20/2014  . GERD (gastroesophageal reflux disease) 01/20/2014  . Essential hypertension, benign 01/20/2014  . Plantar fasciitis of left foot 09/19/2011   Thank you,  Genene Churn, [place]  [facility] 02/22/2020, 10:40 AM  [place] [address] [place], [place], [address] Phone: [phone]   Fax:  [phone]   Name: ILYNN STAUFFER MRN: [id number] Date of Birth: 1965/08/29

## 2020-02-24 ENCOUNTER — Other Ambulatory Visit: Payer: Self-pay

## 2020-02-24 ENCOUNTER — Ambulatory Visit (HOSPITAL_COMMUNITY): Payer: BC Managed Care – PPO | Admitting: Speech Pathology

## 2020-02-24 ENCOUNTER — Encounter (HOSPITAL_COMMUNITY): Payer: Self-pay | Admitting: Speech Pathology

## 2020-02-24 DIAGNOSIS — R41841 Cognitive communication deficit: Secondary | ICD-10-CM

## 2020-02-24 DIAGNOSIS — R4701 Aphasia: Secondary | ICD-10-CM

## 2020-02-24 NOTE — Therapy (Signed)
[place], [place], [address] Phone: [phone]   Fax:  [phone]  Speech Language Pathology Treatment  Patient Details  Name: Joanna Reid MRN: [id number] Date of Birth: 1966/04/05 Referring Provider (SLP): Lala Lund, MD   Encounter Date: 02/24/2020   End of Session - 02/24/20 1034    Visit Number 16    Number of Visits 17    Date for SLP Re-Evaluation 03/01/20    Authorization Type [employer] Comm PPO   $30 copay, 60 combined visit limit, OOP max $5350   SLP Start Time 1030    SLP Stop Time  1115    SLP Time Calculation (min) 45 min    Activity Tolerance Patient tolerated treatment well           Past Medical History:  Diagnosis Date  . Acid reflux   . Allergic rhinitis   . Cancer ([place])    multiple myeloma  . Diabetes mellitus    type 2  . Gout   . Gout   . HBP (high blood pressure)   . History of kidney stones   . Migraines     Past Surgical History:  Procedure Laterality Date  . BREAST CYST EXCISION Left    2009 no visible scar on skin  . CESAREAN SECTION    . COLONOSCOPY WITH PROPOFOL N/A 12/25/2019   Procedure: COLONOSCOPY WITH PROPOFOL;  Surgeon: Rogene Houston, MD;  [facility];  Service: Endoscopy;  Laterality: N/A;  730  . EXTRACORPOREAL SHOCK WAVE LITHOTRIPSY Left 10/10/2017   Procedure: LEFT EXTRACORPOREAL SHOCK WAVE LITHOTRIPSY (ESWL);  Surgeon: Bjorn Loser, MD;  [facility];  Service: Urology;  Laterality: Left;  . EYE SURGERY    . HEMORRHOID SURGERY N/A 11/19/2012   Procedure: HEMORRHOIDECTOMY;  Surgeon: Jamesetta So, MD;  Location: [facility];  Service: General;  Laterality: N/A;  . kidney stones  1998  . LAPAROSCOPIC UNILATERAL SALPINGO OOPHERECTOMY  05/14/2012   Procedure: LAPAROSCOPIC UNILATERAL SALPINGO OOPHORECTOMY;  Surgeon: [name], MD;  Location: [facility];  Service: Gynecology;  Laterality: Right;  laparoscopic right salpingo-oophorectomy  .  PARTIAL HYSTERECTOMY    . POLYPECTOMY  12/25/2019   Procedure: POLYPECTOMY;  Surgeon: Rogene Houston, MD;  [facility];  Service: Endoscopy;;  . TONSILECTOMY, ADENOIDECTOMY, BILATERAL MYRINGOTOMY AND TUBES    . VESICOVAGINAL FISTULA CLOSURE W/ TAH      There were no vitals filed for this visit.   Subjective Assessment - 02/24/20 1033    Subjective "I have been practicing my words."    Currently in Pain? No/denies            ADULT SLP TREATMENT - 02/24/20 1033      General Information   Behavior/Cognition Alert;Cooperative;Pleasant mood    Patient Positioning Upright in chair    Oral care provided N/A    HPI Pt admitted on 12/26/19 for stroke. TPA note given, outside of time window. MRI 12/27/19 acute ischemic nonhemorrhagic left MCA territory infarct involving the left parietal lobe, few scattered punctate acute ischemic nonhemorrhagic infarcts bilateral frontal and parietal lobes as wellas the left cerebellum, small remote right cerebellar infarcts. PMH Essential hypertension,  obesity, Multiple myeloma ([place]), DM, Anemia, COVID. Pt is referred for outpatient SLP evaluation and treatment for aphasia from acute CVA.      Treatment Provided   Treatment provided Cognitive-Linquistic      Pain Assessment   Pain Assessment No/denies pain  Cognitive-Linquistic Treatment   Treatment focused on Aphasia;Patient/family/caregiver education    Skilled Treatment Answering Wh-questions, use of communication support page, implementing word finding and oral reading strategies, use of orthographic cues, patient centered approach, reading comprehension, self evaluation, and oral reading tasks      Assessment / Recommendations / [place] with current plan of care      Progression Toward Goals   Progression toward goals Progressing toward goals              SLP Short Term Goals - 02/24/20 1037      SLP SHORT TERM GOAL #1   Title Pt will implement word-finding  strategies with 80% accuracy when unable to verbalize desired word in conversation/functional tasks with mod assist.    Baseline ~60%    Time 4    Period Weeks    Status Achieved    Target Date 01/29/20      SLP SHORT TERM GOAL #2   Title Pt will describe objects and pictures by providing at least three salient features as judged by clinician with 90% acc when provided min cues.    Baseline 66%    Time 4    Period Weeks    Status Achieved    Target Date 01/29/20      SLP SHORT TERM GOAL #3   Title Pt will increase divergent naming to 7+ items per concrete category with min cues.    Baseline 5    Time 4    Period Weeks    Status Achieved    Target Date 01/29/20      SLP SHORT TERM GOAL #4   Title Pt will complete basic sentence level reading comprehension tasks with 80% acc and min cues for error awareness.    Baseline 65% sentence level with multiple choice response    Time 4    Period Weeks    Status Achieved    Target Date 01/29/20      SLP SHORT TERM GOAL #5   Title Pt will be able to write personal/bio information with 90% acc when provided mod cues    Baseline Able to copy name only    Time 4    Period Weeks    Status Achieved    Target Date 01/29/20      SLP SHORT TERM GOAL #6   Title Pt will respond to open-ended questions using a complete sentence with 80% acc and min cues.    Baseline telegraphic responses    Time 4    Period Weeks    Status On-going    Target Date 02/29/20      SLP SHORT TERM GOAL #7   Title Pt will implement word-finding strategies with 90% accuracy when unable to verbalize desired word in conversation/functional tasks with min assist.    Baseline 80% mi/mod assist    Time 4    Period Weeks    Status On-going    Target Date 03/01/20      SLP SHORT TERM GOAL #8   Title Pt will describe objects and pictures without naming by providing at least 4 salient features as judged by clinician with 90% acc when provided indirect cues.     Baseline 80%    Time 4    Period Weeks    Status On-going    Target Date 03/01/20      SLP SHORT TERM GOAL  #9   TITLE Pt will complete moderately complex sentence and paragraph  reading comprehension tasks with 90% acc with min assist.    Baseline sentence 80%    Time 4    Period Weeks    Status On-going    Target Date 03/01/20            SLP Long Term Goals - 02/24/20 1038      SLP LONG TERM GOAL #1   Title Pt will communicate moderately complex thoughts and feelings to [facility] with use of strategies as needed.    Baseline mod assist    Time 8    Period Weeks    Status On-going            Plan - 02/24/20 1037    Clinical Impression Statement In session, she completed moderately complex word retrieval tasks (naming a word that meets three specific criteria) with min assist and 100% acc. She repeated sentences using multisyllabic words with 100% acc with allowance for self corrections as needed.  Pt continues to make excellent progress toward goals. Plan for one more treatment session prior to d/c to home program. Pt/family in agreement with plan of care.    Duration --   8 weeks   Treatment/Interventions SLP instruction and feedback;Internal/external aids;Compensatory strategies;Compensatory techniques;Patient/family education;Language facilitation;Cueing hierarchy;Multimodal communcation approach    Potential to Achieve Goals Good    SLP Home Exercise Plan Pt will complete HEP as assigned to facilitate carryover of treatment strategies and techniques in home environment with assist from spouse as needed.    Consulted and Agree with Plan of Care Patient;Family member/caregiver    Family Member Consulted Husband, [name]           Patient will benefit from skilled therapeutic intervention in order to improve the following deficits and impairments:   Cognitive communication deficit  Aphasia    Problem List Patient Active Problem List   Diagnosis Date Noted  . Stroke  (cerebrum) -Left parietal lobe, 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  12/26/2019  . H/o COVID-19--- was Positive 09/17/2019, Negative 12/23/19 AND also Neg on  12/26/19 12/26/2019  . Acute bronchiolitis due to respiratory syncytial virus (RSV) 08/11/2018  . Hypomagnesemia 08/10/2018  . Hyperlipidemia 08/09/2018  . Anemia 08/09/2018  . Leukopenia 08/09/2018  . Hypokalemia 08/09/2018  . Hypocalcemia 08/09/2018  . Lactic acidosis 08/09/2018  . Sepsis ([place]) 04/25/2018  . Goals of care, counseling/discussion 11/01/2017  . Multiple myeloma ([place]) 10/29/2017  . Hypercalcemia 10/29/2017  . Type 2 diabetes mellitus without complication, without long-term current use of insulin ([place]) 01/27/2016  . Morbid obesity due to excess calories ([place]) 01/27/2016  . Anxiety as acute reaction to exceptional stress 01/13/2015  . Attention deficit hyperactivity disorder (ADHD), 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  . Gout 01/20/2014  . GERD (gastroesophageal reflux disease) 01/20/2014  . Essential hypertension, benign 01/20/2014  . Plantar fasciitis of left foot 09/19/2011   Thank you,  Genene Churn, [place]  [facility] 02/24/2020, 10:48 AM  [place] [address] [place], [place], [address] Phone: [phone]   Fax:  [phone]   Name: Joanna Reid MRN: [id number] Date of Birth: 20-Sep-1965

## 2020-02-29 ENCOUNTER — Ambulatory Visit (HOSPITAL_COMMUNITY): Payer: BC Managed Care – PPO | Admitting: Speech Pathology

## 2020-02-29 ENCOUNTER — Encounter (HOSPITAL_COMMUNITY): Payer: Self-pay | Admitting: Speech Pathology

## 2020-02-29 ENCOUNTER — Other Ambulatory Visit: Payer: Self-pay

## 2020-02-29 DIAGNOSIS — R4701 Aphasia: Secondary | ICD-10-CM | POA: Diagnosis not present

## 2020-02-29 DIAGNOSIS — R41841 Cognitive communication deficit: Secondary | ICD-10-CM

## 2020-02-29 NOTE — Therapy (Signed)
[place], [place], [address] Phone: [phone]   Fax:  [phone]  Speech Language Pathology Treatment  Patient Details  Name: Joanna Reid MRN: [id number] Date of Birth: Aug 07, 1965 Referring Provider (SLP): Lala Lund, MD   Encounter Date: 02/29/2020   End of Session - 02/29/20 1040    Visit Number 17    Number of Visits 17    Date for SLP Re-Evaluation 03/01/20    Authorization Type [employer] PPO   $30 copay, 60 combined visit limit, OOP max $[id number]   SLP Start Time 1035    SLP Stop Time  1120    SLP Time Calculation (min) 45 min    Activity Tolerance Patient tolerated treatment well           Past Medical History:  Diagnosis Date  . Acid reflux   . Allergic rhinitis   . Cancer ([place])    multiple myeloma  . Diabetes mellitus    type 2  . Gout   . Gout   . HBP (high blood pressure)   . History of kidney stones   . Migraines     Past Surgical History:  Procedure Laterality Date  . BREAST CYST EXCISION Left    2009 no visible scar on skin  . CESAREAN SECTION    . COLONOSCOPY WITH PROPOFOL N/A 12/25/2019   Procedure: COLONOSCOPY WITH PROPOFOL;  Surgeon: Rogene Houston, MD;  [facility];  Service: Endoscopy;  Laterality: N/A;  730  . EXTRACORPOREAL SHOCK WAVE LITHOTRIPSY Left 10/10/2017   Procedure: LEFT EXTRACORPOREAL SHOCK WAVE LITHOTRIPSY (ESWL);  Surgeon: Bjorn Loser, MD;  Location: [facility];  Service: Urology;  Laterality: Left;  . EYE SURGERY    . HEMORRHOID SURGERY N/A 11/19/2012   Procedure: HEMORRHOIDECTOMY;  Surgeon: Jamesetta So, MD;  Location: [facility];  Service: General;  Laterality: N/A;  . kidney stones  1998  . LAPAROSCOPIC UNILATERAL SALPINGO OOPHERECTOMY  05/14/2012   Procedure: LAPAROSCOPIC UNILATERAL SALPINGO OOPHORECTOMY;  Surgeon: [name], MD;  Location: [facility];  Service: Gynecology;  Laterality: Right;  laparoscopic right salpingo-oophorectomy  .  PARTIAL HYSTERECTOMY    . POLYPECTOMY  12/25/2019   Procedure: POLYPECTOMY;  Surgeon: Rogene Houston, MD;  [facility]: [facility];  Service: Endoscopy;;  . TONSILECTOMY, ADENOIDECTOMY, BILATERAL MYRINGOTOMY AND TUBES    . VESICOVAGINAL FISTULA CLOSURE W/ TAH      There were no vitals filed for this visit.   Subjective Assessment - 02/29/20 1039    Subjective "Oh yes!"    Currently in Pain? No/denies           02/29/20 1341  General Information  Behavior/Cognition Alert;Cooperative;Pleasant mood  Patient Positioning Upright in chair  Oral care provided N/A  HPI Pt admitted on 12/26/19 for stroke. TPA note given, outside of time window. MRI 12/27/19 acute ischemic nonhemorrhagic left MCA territory infarct involving the left parietal lobe, few scattered punctate acute ischemic nonhemorrhagic infarcts bilateral frontal and parietal lobes as wellas the left cerebellum, small remote right cerebellar infarcts. PMH Essential hypertension,  obesity, Multiple myeloma ([place]), DM, Anemia, COVID. Pt is referred for outpatient SLP evaluation and treatment for aphasia from acute CVA.  Treatment Provided  Treatment provided Cognitive-Linquistic  Pain Assessment  Pain Assessment No/denies pain  Cognitive-Linquistic Treatment  Treatment focused on Aphasia;Patient/family/caregiver education  Skilled Treatment Answering Wh-questions, use of communication support page, implementing word finding and oral reading strategies, use of orthographic cues,  patient centered approach, reading comprehension, self evaluation, and oral reading tasks  Assessment / Recommendations / Plan  Plan Discharge SLP treatment due to (comment);All goals met  Progression Toward Goals  Progression toward goals Goals met, education completed, patient discharged from [facility] - 02/29/20 [place] #1   Title Pt will implement word-finding strategies with 80% accuracy when unable to  verbalize desired word in conversation/functional tasks with mod assist.    Baseline ~60%    Time 4    Period Weeks    Status Achieved    Target Date 01/29/20      SLP SHORT TERM GOAL #2   Title Pt will describe objects and pictures by providing at least three salient features as judged by clinician with 90% acc when provided min cues.    Baseline 66%    Time 4    Period Weeks    Status Achieved    Target Date 01/29/20      SLP SHORT TERM GOAL #3   Title Pt will increase divergent naming to 7+ items per concrete category with min cues.    Baseline 5    Time 4    Period Weeks    Status Achieved    Target Date 01/29/20      SLP SHORT TERM GOAL #4   Title Pt will complete basic sentence level reading comprehension tasks with 80% acc and min cues for error awareness.    Baseline 65% sentence level with multiple choice response    Time 4    Period Weeks    Status Achieved    Target Date 01/29/20      SLP SHORT TERM GOAL #5   Title Pt will be able to write personal/bio information with 90% acc when provided mod cues    Baseline Able to copy name only    Time 4    Period Weeks    Status Achieved    Target Date 01/29/20      SLP SHORT TERM GOAL #6   Title Pt will respond to open-ended questions using a complete sentence with 80% acc and min cues.    Baseline telegraphic responses    Time 4    Period Weeks    Status Achieved    Target Date 02/29/20      SLP SHORT TERM GOAL #7   Title Pt will implement word-finding strategies with 90% accuracy when unable to verbalize desired word in conversation/functional tasks with min assist.    Baseline 80% mi/mod assist    Time 4    Period Weeks    Status Achieved    Target Date 03/01/20      SLP SHORT TERM GOAL #8   Title Pt will describe objects and pictures without naming by providing at least 4 salient features as judged by clinician with 90% acc when provided indirect cues.    Baseline 80%    Time 4    Period Weeks     Status Achieved    Target Date 03/01/20      SLP SHORT TERM GOAL  #9   TITLE Pt will complete moderately complex sentence and paragraph reading comprehension tasks with 90% acc with min assist.    Baseline sentence 80%    Time 4    Period Weeks    Status Achieved    Target Date 03/01/20  SLP Long Term Goals - 02/29/20 1042      SLP LONG TERM GOAL #1   Title Pt will communicate moderately complex thoughts and feelings to [facility] with use of strategies as needed.    Baseline mod assist    Time 8    Period Weeks    Status Achieved            Plan - 02/29/20 1041    Clinical Impression Statement Pt accompanied to SLP session by her husband. Pt met all of her SLP goals above and is pleased with her current level of functioning, although not quite yet back to baseline. Pt is able to verbally express complex thoughts and feelings with allowance for extra time as needed. She is encouraged to continue reading aloud and silently at home, engage in conversation with a multitude of communication partners, and to play word games with her family (Taboo, Quicktionary, Heads Up, etc) going forward. No further skilled SLP services indicated at this time.    Duration    Treatment/Interventions SLP instruction and feedback;Internal/external aids;Compensatory strategies;Compensatory techniques;Patient/family education;Language facilitation;Cueing hierarchy;Multimodal communcation approach    Potential to Achieve Goals Good    SLP Home Exercise Plan Pt will complete HEP as assigned to facilitate carryover of treatment strategies and techniques in home environment with assist from spouse as needed.    Consulted and Agree with Plan of Care Patient;Family member/caregiver    Family Member Consulted Husband, [name]           Patient will benefit from skilled therapeutic intervention in order to improve the following deficits and impairments:   Cognitive communication  deficit  Aphasia    Problem List Patient Active Problem List   Diagnosis Date Noted  . 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 H/o COVID-19--- was Positive 09/17/2019, Negative 12/23/19 AND also Neg on  12/26/19 12/26/2019  . Acute bronchiolitis due to respiratory syncytial virus (RSV) 08/11/2018  . Hypomagnesemia 08/10/2018  . Hyperlipidemia 08/09/2018  . Anemia 08/09/2018  . Leukopenia 08/09/2018  . Hypokalemia 08/09/2018  . Hypocalcemia 08/09/2018  . Lactic acidosis 08/09/2018  . Sepsis ([place]) 04/25/2018  . Goals of care, counseling/discussion 11/01/2017  . Multiple myeloma ([place]) 10/29/2017  . Hypercalcemia 10/29/2017  . Type 2 diabetes mellitus without complication, without long-term current use of insulin ([place]) 01/27/2016  . Morbid obesity due to excess calories ([place]) 01/27/2016  . Anxiety as acute reaction to exceptional stress 01/13/2015  . Attention deficit hyperactivity disorder (ADHD), 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  . Gout 01/20/2014  . GERD (gastroesophageal reflux disease) 01/20/2014  . Essential hypertension, benign 01/20/2014  . Plantar fasciitis of left foot 09/19/2011   SPEECH THERAPY DISCHARGE SUMMARY  Visits from Start of Care: 17  Current functional level related to goals / functional outcomes: Goals met   Remaining deficits: See above, mild paraphasic errors in conversation and with production of multisyllabic words   Education / Equipment: Completed  Plan: Patient agrees to discharge.  Patient goals were met. Patient is being discharged due to meeting the stated rehab goals.  ?????         Thank you,  Genene Churn, [place]  [facility] 02/29/2020, 1:43 PM  [facility] [address] [place], [place], [address] Phone: [phone]   Fax:  [phone]   Name: Joanna Reid MRN: [id number] Date of Birth: 03/13/1966

## 2020-03-01 ENCOUNTER — Ambulatory Visit (HOSPITAL_COMMUNITY): Payer: BC Managed Care – PPO

## 2020-03-01 ENCOUNTER — Ambulatory Visit (HOSPITAL_COMMUNITY): Payer: BC Managed Care – PPO | Admitting: Hematology

## 2020-03-01 ENCOUNTER — Other Ambulatory Visit (HOSPITAL_COMMUNITY): Payer: BC Managed Care – PPO

## 2020-03-02 ENCOUNTER — Other Ambulatory Visit: Payer: Self-pay | Admitting: *Deleted

## 2020-03-02 ENCOUNTER — Ambulatory Visit: Payer: Self-pay | Admitting: *Deleted

## 2020-03-02 ENCOUNTER — Inpatient Hospital Stay (HOSPITAL_COMMUNITY): Payer: BC Managed Care – PPO

## 2020-03-02 DIAGNOSIS — C9000 Multiple myeloma not having achieved remission: Secondary | ICD-10-CM | POA: Diagnosis not present

## 2020-03-02 DIAGNOSIS — C9001 Multiple myeloma in remission: Secondary | ICD-10-CM

## 2020-03-02 LAB — CBC WITH DIFFERENTIAL/PLATELET
Abs Immature Granulocytes: 0.01 10*3/uL (ref 0.00–0.07)
Basophils Absolute: 0 10*3/uL (ref 0.0–0.1)
Basophils Relative: 1 %
Eosinophils Absolute: 0 10*3/uL (ref 0.0–0.5)
Eosinophils Relative: 1 %
HCT: 35.4 % — ABNORMAL LOW (ref 36.0–46.0)
Hemoglobin: 11.7 g/dL — ABNORMAL LOW (ref 12.0–15.0)
Immature Granulocytes: 0 %
Lymphocytes Relative: 32 %
Lymphs Abs: 1.2 10*3/uL (ref 0.7–4.0)
MCH: 30.2 pg (ref 26.0–34.0)
MCHC: 33.1 g/dL (ref 30.0–36.0)
MCV: 91.5 fL (ref 80.0–100.0)
Monocytes Absolute: 0.5 10*3/uL (ref 0.1–1.0)
Monocytes Relative: 12 %
Neutro Abs: 2.1 10*3/uL (ref 1.7–7.7)
Neutrophils Relative %: 54 %
Platelets: 190 10*3/uL (ref 150–400)
RBC: 3.87 MIL/uL (ref 3.87–5.11)
RDW: 14.6 % (ref 11.5–15.5)
WBC: 3.8 10*3/uL — ABNORMAL LOW (ref 4.0–10.5)
nRBC: 0 % (ref 0.0–0.2)

## 2020-03-02 LAB — COMPREHENSIVE METABOLIC PANEL
ALT: 15 U/L (ref 0–44)
AST: 21 U/L (ref 15–41)
Albumin: 4.1 g/dL (ref 3.5–5.0)
Alkaline Phosphatase: 68 U/L (ref 38–126)
Anion gap: 12 (ref 5–15)
BUN: 24 mg/dL — ABNORMAL HIGH (ref 6–20)
CO2: 26 mmol/L (ref 22–32)
Calcium: 9.5 mg/dL (ref 8.9–10.3)
Chloride: 101 mmol/L (ref 98–111)
Creatinine, Ser: 1.03 mg/dL — ABNORMAL HIGH (ref 0.44–1.00)
GFR calc Af Amer: >60 mL/min (ref >60)
GFR calc non Af Amer: >60 mL/min (ref >60)
Glucose, Bld: 103 mg/dL — ABNORMAL HIGH (ref 70–99)
Potassium: 4.3 mmol/L (ref 3.5–5.1)
Sodium: 139 mmol/L (ref 135–145)
Total Bilirubin: 0.9 mg/dL (ref 0.3–1.2)
Total Protein: 7.3 g/dL (ref 6.5–8.1)

## 2020-03-02 LAB — LACTATE DEHYDROGENASE: LDH: 145 U/L (ref 98–192)

## 2020-03-02 NOTE — Patient Outreach (Signed)
[facility]) Care Management  03/02/2020  Joanna Reid December 25, 1965 [id number]   Subjective: Telephone call to patient's home / mobile number, spoke with patient, and HIPAA verified.  Patient states she is doing" gucci", doing great, completed speech therapy on 02/29/2020, had 2nd covid vaccine today, is drinking plenty of water, and planning a beach trip for last September.   States has an appointment with oncologist on 03/09/2020 and will be resuming chemo therapy soon.   RNCM advised will send MD involvement letter to patient's primary MD and patient voices understanding.  Patient states she does not have any education material, nurse call line, care coordination, disease management, disease monitoring, transportation, community resource, or pharmacy needs at this time.  Patient states she does not have any education material, EMMI follow up, care coordination, care management, disease monitoring, transportation, community resource, or pharmacy needs at this time. States she is very appreciative of the follow up, is in agreement  to receive 1 additional follow up call to assess for further CM needs, and is in agreement to receive [place] Management EMMI follow up calls as needed. RNCM provided contact name and number: [phone] and advised patient to contact RNCM if needed prior to next patient outreach.     Objective: Per KPN (Knowledge Performance Now, point of care tool) and chart review,patient hospitalized 12/26/2019 - 12/28/2019 forStroke (cerebrum),confusion, speech deficits primarily expressive aphasia, . Patient also has a history of hypertension, diabetes, Multiple myeloma([facility] and currently onMaintenance Velcade every 2 weeks and Revlimid 10 mg 3 weeks on/1 week off started on 07/29/2018) , Anemia, COVID 19 (was Positive 09/17/2019, Negative 12/23/19,Negativeon 12/26/19), UTI, gout,kidney stones,Pancytopenia with  moderatethrombocytopenia, and Migraines.    Assessment: Received Pharmacologist EMMI Stroke Google Alert on 01/12/2020. Red Flag Alert Trigger, Day #13, patient answered no to the following question: Went to follow-up appointment? EMMI follow up completed and will follow up to assess further care management needs.      Plan: RNCM has sent patient successful outreach letter, [facility] pamphlet, magnet, and Advanced Directives packet, per patient's request. RNCM will send patient MD Involvement letter per [facility] Management Assistant Clinical Director's request. RNCM will call patient for telephone outreach attempt, within 30  business days, to assess for further CM needs, and proceed with case closure, after 4th unsuccessful outreach.      Symantha Steeber H. Annia Friendly, BSN, [facility] Telephonic CM Phone: [phone] Fax: [phone]

## 2020-03-03 ENCOUNTER — Encounter (HOSPITAL_COMMUNITY): Payer: BC Managed Care – PPO | Admitting: Speech Pathology

## 2020-03-03 LAB — PROTEIN ELECTROPHORESIS, SERUM
A/G Ratio: 1.3 (ref 0.7–1.7)
Albumin ELP: 3.7 g/dL (ref 2.9–4.4)
Alpha-1-Globulin: 0.2 g/dL (ref 0.0–0.4)
Alpha-2-Globulin: 0.7 g/dL (ref 0.4–1.0)
Beta Globulin: 1 g/dL (ref 0.7–1.3)
Gamma Globulin: 0.9 g/dL (ref 0.4–1.8)
Globulin, Total: 2.9 g/dL (ref 2.2–3.9)
Total Protein ELP: 6.6 g/dL (ref 6.0–8.5)

## 2020-03-03 LAB — KAPPA/LAMBDA LIGHT CHAINS
Kappa free light chain: 33.5 mg/L — ABNORMAL HIGH (ref 3.3–19.4)
Kappa, lambda light chain ratio: 1.9 — ABNORMAL HIGH (ref 0.26–1.65)
Lambda free light chains: 17.6 mg/L (ref 5.7–26.3)

## 2020-03-05 LAB — IMMUNOFIXATION ELECTROPHORESIS
IgA: 129 mg/dL (ref 87–352)
IgG (Immunoglobin G), Serum: 951 mg/dL (ref 586–1602)
IgM (Immunoglobulin M), Srm: 21 mg/dL — ABNORMAL LOW (ref 26–217)
Total Protein ELP: 7.1 g/dL (ref 6.0–8.5)

## 2020-03-09 ENCOUNTER — Ambulatory Visit (HOSPITAL_COMMUNITY): Payer: BC Managed Care – PPO | Admitting: Hematology

## 2020-03-09 ENCOUNTER — Inpatient Hospital Stay (HOSPITAL_COMMUNITY): Payer: BC Managed Care – PPO

## 2020-03-09 ENCOUNTER — Inpatient Hospital Stay (HOSPITAL_COMMUNITY): Payer: BC Managed Care – PPO | Attending: Hematology | Admitting: Hematology

## 2020-03-09 ENCOUNTER — Other Ambulatory Visit: Payer: Self-pay

## 2020-03-09 VITALS — BP 125/82 | HR 70 | Temp 96.9°F | Resp 18 | Wt 250.1 lb

## 2020-03-09 DIAGNOSIS — C9000 Multiple myeloma not having achieved remission: Secondary | ICD-10-CM | POA: Insufficient documentation

## 2020-03-09 DIAGNOSIS — Z23 Encounter for immunization: Secondary | ICD-10-CM | POA: Insufficient documentation

## 2020-03-09 DIAGNOSIS — Z5112 Encounter for antineoplastic immunotherapy: Secondary | ICD-10-CM | POA: Insufficient documentation

## 2020-03-09 MED ORDER — PNEUMOCOCCAL VAC POLYVALENT 25 MCG/0.5ML IJ INJ
0.5000 mL | INJECTION | Freq: Once | INTRAMUSCULAR | Status: AC
  Administered 2020-03-09: 0.5 mL via INTRAMUSCULAR
  Filled 2020-03-09: qty 0.5

## 2020-03-09 MED ORDER — PROCHLORPERAZINE MALEATE 10 MG PO TABS: 10.0000 mg | ORAL_TABLET | Freq: Once | ORAL | Status: DC

## 2020-03-09 MED ORDER — BORTEZOMIB CHEMO SQ INJECTION 3.5 MG (2.5MG/ML)
1.3000 mg/m2 | Freq: Once | INTRAMUSCULAR | Status: AC
  Administered 2020-03-09: 3 mg via SUBCUTANEOUS
  Filled 2020-03-09: qty 1.2

## 2020-03-09 MED ORDER — MEASLES, MUMPS & RUBELLA VAC IJ SOLR
0.5000 mL | Freq: Once | INTRAMUSCULAR | Status: AC
  Administered 2020-03-09: 0.5 mL via SUBCUTANEOUS
  Filled 2020-03-09: qty 0.5

## 2020-03-09 NOTE — Progress Notes (Signed)
Labs reviewed today at office visit, will restart patient on her vecade and dexamethasone, no revlimid per MD. Consent obtained.   Vaccinations given and velcade given per orders. Patient tolerated it well without problems. Vitals stable and discharged home from clinic ambulatory. Follow up as scheduled.

## 2020-03-09 NOTE — Progress Notes (Signed)
Joanna Reid, [place] [address]   CLINIC:  Medical Oncology/Hematology  PCP:  [name], DO [address] / [place] [address]  [phone]  REASON FOR VISIT:  Follow-up for multiple myeloma  PRIOR THERAPY:  1. RVD x 4 cycles from 11/01/2017 through 01/14/2018. 2. Stem cell transplant on 02/20/2018. 3. Velcade from 06/11/2018 to 12/22/2019.  CURRENT THERAPY: Revlimid  INTERVAL HISTORY:  [name], a 54 y.o. female, returns for routine follow-up for her multiple myeloma. Karis was last seen on 02/09/2020.  Today she reports that she is 92% of her usual self. She has finished her speech therapy on Monday, 8/23. She complains of having burning pain on the lateral side of the lower half of her left leg, which is changing in its nature but not worsening. She stopped taking Lyrica around 8/3. She denies dyspnea or SOB. Her neuropathy affected her feet, but it is stable and does not bother her even when she sleeps.  She has received both of her [place] vaccines, the last one on 8/25.   REVIEW OF SYSTEMS:  Review of Systems  Constitutional: Negative for appetite change and fatigue.  Respiratory: Negative for shortness of breath.   Genitourinary: Positive for dysuria (bacteriuria, but no UTI).   Musculoskeletal: Positive for arthralgias (left leg pain).  Neurological: Positive for numbness (legs).  All other systems reviewed and are negative.   PAST MEDICAL/SURGICAL HISTORY:  Past Medical History:  Diagnosis Date  . Acid reflux   . Allergic rhinitis   . Cancer ([place])    multiple myeloma  . Diabetes mellitus    type 2  . Gout   . Gout   . HBP (high blood pressure)   . History of kidney stones   . Migraines    Past Surgical History:  Procedure Laterality Date  . BREAST CYST EXCISION Left    2009 no visible scar on skin  . CESAREAN SECTION    . COLONOSCOPY WITH PROPOFOL N/A 12/25/2019   Procedure: COLONOSCOPY  WITH PROPOFOL;  Surgeon: Rogene Houston, MD;  Location: [facility];  Service: Endoscopy;  Laterality: N/A;  730  . EXTRACORPOREAL SHOCK WAVE LITHOTRIPSY Left 10/10/2017   Procedure: LEFT EXTRACORPOREAL SHOCK WAVE LITHOTRIPSY (ESWL);  Surgeon: Bjorn Loser, MD;  Location: [facility];  Service: Urology;  Laterality: Left;  . EYE SURGERY    . HEMORRHOID SURGERY N/A 11/19/2012   Procedure: HEMORRHOIDECTOMY;  Surgeon: Jamesetta So, MD;  Location: [facility];  Service: General;  Laterality: N/A;  . kidney stones  1998  . LAPAROSCOPIC UNILATERAL SALPINGO OOPHERECTOMY  05/14/2012   Procedure: LAPAROSCOPIC UNILATERAL SALPINGO OOPHORECTOMY;  Surgeon: [name], MD;  Location: [facility];  Service: Gynecology;  Laterality: Right;  laparoscopic right salpingo-oophorectomy  . PARTIAL HYSTERECTOMY    . POLYPECTOMY  12/25/2019   Procedure: POLYPECTOMY;  Surgeon: Rogene Houston, MD;  Location: [facility];  Service: Endoscopy;;  . TONSILECTOMY, ADENOIDECTOMY, BILATERAL MYRINGOTOMY AND TUBES    . VESICOVAGINAL FISTULA CLOSURE W/ TAH      SOCIAL HISTORY:  Social History   Socioeconomic History  . Marital status: Married    Spouse name: Not on file  . Number of children: Not on file  . Years of education: 9  . Highest education level: Not on file  Occupational History    Employer: [employer]  Tobacco Use  . Smoking status: Former Smoker    Quit date: 02/27/1999  Years since quitting: 21.0  . Smokeless tobacco: Never Used  . Tobacco comment: socially   Vaping Use  . Vaping Use: Never used  Substance and Sexual Activity  . Alcohol use: No    Alcohol/week: 0.0 standard drinks  . Drug use: No  . Sexual activity: Yes  Other Topics Concern  . Not on file  Social History Narrative  . Not on file   Social Determinants of Health   Financial Resource Strain: Low Risk   . Difficulty of Paying Living Expenses: Not hard at all  Food Insecurity: No Food Insecurity  . Worried About Sales executive in the Last Year: Never true  . Ran Out of Food in the Last Year: Never true  Transportation Needs: No Transportation Needs  . Lack of Transportation (Medical): No  . Lack of Transportation (Non-Medical): No  Physical Activity: Insufficiently Active  . Days of Exercise per Week: 7 days  . Minutes of Exercise per Session: 10 min  Stress:   . Feeling of Stress : Not on file  Social Connections: Socially Integrated  . Frequency of Communication with Friends and Family: More than three times a week  . Frequency of Social Gatherings with Friends and Family: More than three times a week  . Attends Religious Services: More than 4 times per year  . Active Member of Clubs or Organizations: Yes  . Attends Archivist Meetings: More than 4 times per year  . Marital Status: Married  Human resources officer Violence: Not At Risk  . Fear of Current or Ex-Partner: No  . Emotionally Abused: No  . Physically Abused: No  . Sexually Abused: No    FAMILY HISTORY:  Family History  Problem Relation Age of Onset  . Arthritis Other   . Cancer Other   . Diabetes Other   . Hypertension Mother   . Dementia Mother   . Diabetes Father   . ALS Father   . Diabetes Brother   . Hypertension Brother   . Cancer Paternal Aunt   . COPD Maternal Grandmother   . Cancer Maternal Grandfather   . Anesthesia problems Paternal Grandfather     CURRENT MEDICATIONS:  Current Outpatient Medications  Medication Sig Dispense Refill  . acyclovir (ZOVIRAX) 400 MG tablet TAKE (1) TABLET BY MOUTH TWICE DAILY. 60 tablet 6  . albuterol (PROVENTIL HFA;VENTOLIN HFA) 108 (90 Base) MCG/ACT inhaler Inhale 2 puffs into the lungs every 4 (four) hours as needed for wheezing or shortness of breath. 1 Inhaler 3  . allopurinol (ZYLOPRIM) 300 MG tablet TAKE 1 TABLET DAILY (Patient taking differently: Take 300 mg by mouth daily. ) 90 tablet 1  . amLODipine (NORVASC) 5 MG tablet Take 5 mg by mouth daily.    Marland Kitchen aspirin EC 81 MG  tablet Take 1 tablet (81 mg total) by mouth daily. 30 tablet 11  . bumetanide (BUMEX) 0.5 MG tablet Take 0.5 mg by mouth daily.     . carvedilol (COREG) 25 MG tablet Take 1 tablet (25 mg total) by mouth 2 (two) times daily.    . Docosanol (ABREVA) 10 % CREA Apply 1 application topically daily as needed (for nose).     Marland Kitchen docusate sodium (COLACE) 100 MG capsule Take 100 mg by mouth daily.     . fluticasone (FLONASE) 50 MCG/ACT nasal spray Place 2 sprays into both nostrils daily. 16 g 0  . lenalidomide (REVLIMID) 10 MG capsule TAKE 1 CAPSULE BY MOUTH  DAILY FOR 21  DAYS ON, THEN  7 DAYS OFF 21 capsule 1  . losartan (COZAAR) 100 MG tablet Take 100 mg by mouth daily.    . magnesium oxide (MAG-OX) 400 (241.3 Mg) MG tablet Take 1 tablet (400 mg total) by mouth 3 (three) times daily. 90 tablet 3  . metFORMIN (GLUCOPHAGE) 500 MG tablet TAKE 1 TABLET BY MOUTH ONCE DAILY WITH BREAKFAST (Patient taking differently: Take 1,000 mg by mouth 2 (two) times daily with a meal. ) 90 tablet 0  . pregabalin (LYRICA) 200 MG capsule TAKE (1) CAPSULE BY MOUTH TWICE DAILY. (Patient taking differently: Take 200 mg by mouth daily. ) 60 capsule 4  . Propylene Glycol (SYSTANE BALANCE) 0.6 % SOLN Apply 1 drop to eye daily as needed (dry eye).     . rosuvastatin (CRESTOR) 10 MG tablet Take 1 tablet (10 mg total) by mouth daily. 30 tablet 0  . TRULICITY 1.5 HU/3.1SH SOPN Inject 0.75 mg into the skin every Monday.     . diazepam (VALIUM) 5 MG tablet Take 1 tablet (5 mg total) by mouth at bedtime. (Patient not taking: Reported on 03/09/2020) 30 tablet 5  . Magnesium 200 MG TABS Take 600 mg by mouth daily. (Patient not taking: Reported on 02/16/2020)    . prochlorperazine (COMPAZINE) 10 MG tablet every 14 (fourteen) days (Patient not taking: Reported on 02/16/2020)    . valACYclovir (VALTREX) 1000 MG tablet TAKE 2 TABLETS NOW, THEN 2 TABLETS 12 HOURS LATER. (Patient not taking: Reported on 02/16/2020) 12 tablet 0   No current  facility-administered medications for this visit.    ALLERGIES:  No Known Allergies  PHYSICAL EXAM:  Performance status (ECOG): 1 - Symptomatic but completely ambulatory  Vitals:   03/09/20 1307  BP: 125/82  Pulse: 70  Resp: 18  Temp: (!) 96.9 F (36.1 C)  SpO2: 99%   Wt Readings from Last 3 Encounters:  03/09/20 250 lb 1.6 oz (113.4 kg)  02/09/20 249 lb 12.8 oz (113.3 kg)  01/26/20 250 lb (113.4 kg)   Physical Exam Vitals reviewed.  Constitutional:      Appearance: Normal appearance. She is obese.  Cardiovascular:     Rate and Rhythm: Normal rate and regular rhythm.     Pulses: Normal pulses.     Heart sounds: Normal heart sounds.  Pulmonary:     Effort: Pulmonary effort is normal.     Breath sounds: Normal breath sounds.  Neurological:     General: No focal deficit present.     Mental Status: She is alert and oriented to person, place, and time.  Psychiatric:        Mood and Affect: Mood normal.        Behavior: Behavior normal.     LABORATORY DATA:  I have reviewed the labs as listed.  CBC Latest Ref Rng & Units 03/02/2020 02/02/2020 01/19/2020  WBC 4.0 - 10.5 K/uL 3.8(L) 3.3(L) 3.4(L)  Hemoglobin 12.0 - 15.0 g/dL 11.7(L) 11.3(L) 11.1(L)  Hematocrit 36 - 46 % 35.4(L) 34.9(L) 33.8(L)  Platelets 150 - 400 K/uL 190 146(L) 161   CMP Latest Ref Rng & Units 03/02/2020 02/02/2020 01/19/2020  Glucose 70 - 99 mg/dL 103(H) 104(H) 124(H)  BUN 6 - 20 mg/dL 24(H) 24(H) 24(H)  Creatinine 0.44 - 1.00 mg/dL 1.03(H) 1.03(H) 1.09(H)  Sodium 135 - 145 mmol/L 139 141 140  Potassium 3.5 - 5.1 mmol/L 4.3 3.7 4.2  Chloride 98 - 111 mmol/L 101 107 108  CO2 22 - 32 mmol/L 26 25  24  Calcium 8.9 - 10.3 mg/dL 9.5 9.1 9.5  Total Protein 6.5 - 8.1 g/dL 7.3 6.8 6.7  Total Bilirubin 0.3 - 1.2 mg/dL 0.9 0.6 0.5  Alkaline Phos 38 - 126 U/L 68 61 63  AST 15 - 41 U/L $Remo'21 19 15  'SNCWk$ ALT 0 - 44 U/L $Remo'15 18 13      'lZMNr$ Component Value Date/Time   RBC 3.87 03/02/2020 1109   MCV 91.5 03/02/2020 1109    MCV 92 12/31/2019 1126   MCH 30.2 03/02/2020 1109   MCHC 33.1 03/02/2020 1109   RDW 14.6 03/02/2020 1109   RDW 15.9 (H) 12/31/2019 1126   LYMPHSABS 1.2 03/02/2020 1109   LYMPHSABS 2.9 08/02/2015 1113   MONOABS 0.5 03/02/2020 1109   EOSABS 0.0 03/02/2020 1109   EOSABS 0.2 08/02/2015 1113   BASOSABS 0.0 03/02/2020 1109   BASOSABS 0.1 08/02/2015 1113   Lab Results  Component Value Date   LDH 145 03/02/2020   LDH 122 02/02/2020   LDH 124 12/08/2019   Lab Results  Component Value Date   TOTALPROTELP 6.6 03/02/2020   TOTALPROTELP 7.1 03/02/2020   ALBUMINELP 3.7 03/02/2020   A1GS 0.2 03/02/2020   A2GS 0.7 03/02/2020   BETS 1.0 03/02/2020   GAMS 0.9 03/02/2020   MSPIKE Not Observed 03/02/2020   SPEI Comment 03/02/2020    Lab Results  Component Value Date   KPAFRELGTCHN 33.5 (H) 03/02/2020   LAMBDASER 17.6 03/02/2020   KAPLAMBRATIO 1.90 (H) 03/02/2020    DIAGNOSTIC IMAGING:  I have independently reviewed the scans and discussed with the patient. No results found.   ASSESSMENT:  1. IgG lambda multiple myeloma, stage III, del 17 p: -4 cycles of RVD from 11/01/2017 through 01/14/2018. -Stem cell transplant on 02/20/2018 at [facility]. -Maintenance Velcade every 2 weeks and Revlimid 10 mg 3 weeks on/1 week off started on 07/29/2018. -Myeloma panel from 12/08/2018 shows M spike not observed. Kappa light chains of 32.1 with ratio of 1.64. Immunofixation was negative. -She hadCVAon 12/26/2019 with aphasia. MRI of the brain on 12/27/2019 shows acute ischemic nonhemorrhagic left MCA territory infarct involving left parietal lobe, corresponding with perfusion deficit on CT scan angiogram. No associated hemorrhage or mass-effect. Additional few scattered punctate acute ischemic nonhemorrhagic infarcts involving bilateral frontal and parietal lobes as well as left cerebellum. -Last Velcade treatment was on 12/22/2019. We have held Revlimid since then.  2.  CVA with aphasia: -We held her  myeloma treatments since CVA with aphasia on 12/26/2019. -Her aphasia is improving.   PLAN:  1. IgG lambda multiple myeloma, stage III, del 17 p: -Myeloma labs from 03/02/2020 showed no M spike.  Immunofixation was normal.Free kappa light chains are slightly elevated at 33.5 and ratio of 1.9. -We will start her on Velcade maintenance every 2 weeks at this time.  We will continue to hold lenalidomide because of her stroke. -Plan to see her back in 8 weeks for follow-up with repeat myeloma labs. -She will receive her immunizations today.  2. Hypomagnesemia: -Continue magnesium supplements at home.  3. Bone strengthening agents: -Continue Zometa every 3 months.  4. Peripheral neuropathy: -She stopped taking Lyrica 200 mg twice daily.  Noticed some burning type of sensation in the left leg lateral side.  I have told her to start back on Lyrica.  5. ID prophylaxis: -Continue acyclovir twice daily.  6. Diabetes: -Continue Metformin and Trulicity.  Orders placed this encounter:  No orders of the defined types were placed in this encounter.  Tuan Tippin, MD [facility] [phone]   I, Daniel Khashchuk, am acting as a scribe for Dr. Aeneas Longsworth Katagadda.  I, Sundeep Cary MD, have reviewed the above documentation for accuracy and completeness, and I agree with the above.     

## 2020-03-09 NOTE — Patient Instructions (Signed)
[place] at [facility] Discharge Instructions  You were seen today by Dr. Delton Coombes. He went over your recent results. You received your Pneumovax and MMR vaccines today. Start taking the Lyrica twice daily. You can receive your COVID booster vaccine 4 weeks after your last COVID vaccine. Dr. Delton Coombes will see you back in 8 weeks for labs and follow up.   Thank you for choosing [place] at [facility] to provide your oncology and hematology care.  To afford each patient quality time with our provider, please arrive at least 15 minutes before your scheduled appointment time.   If you have a lab appointment with the [facility] please come in thru the Main Entrance and check in at the main information desk  You need to re-schedule your appointment should you arrive 10 or more minutes late.  We strive to give you quality time with our providers, and arriving late affects you and other patients whose appointments are after yours.  Also, if you no show three or more times for appointments you may be dismissed from the clinic at the providers discretion.     Again, thank you for choosing [facility].  Our hope is that these requests will decrease the amount of time that you wait before being seen by our physicians.       _____________________________________________________________  Should you have questions after your visit to [facility], please contact our office at [phone] between the hours of 8:00 a.m. and 4:30 p.m.  Voicemails left after 4:00 p.m. will not be returned until the following business day.  For prescription refill requests, have your pharmacy contact our office and allow 72 hours.    Cancer Center Support Programs:   > Cancer Support Group  2nd Tuesday of the month 1pm-2pm, Journey Room

## 2020-03-09 NOTE — Progress Notes (Signed)
Notice from Dr Delton Coombes  Restarting Velcade at 1.3 mg/m2 every 14 days.  Additional cycles added and PA approved.  OK to add with cosign for Dr Delton Coombes.  Patient wants to wait until next appt to get zometa.  T.O. Dr Jim Desanctis, RN/Erminio Nygard Ronnald Ramp, PharmD

## 2020-03-09 NOTE — Patient Instructions (Signed)
[facility] Discharge Instructions for Patients Receiving Chemotherapy  Today you received the following chemotherapy agents   To help prevent nausea and vomiting after your treatment, we encourage you to take your nausea medication   If you develop nausea and vomiting that is not controlled by your nausea medication, call the clinic.   BELOW ARE SYMPTOMS THAT SHOULD BE REPORTED IMMEDIATELY:  *FEVER GREATER THAN 100.5 F  *CHILLS WITH OR WITHOUT FEVER  NAUSEA AND VOMITING THAT IS NOT CONTROLLED WITH YOUR NAUSEA MEDICATION  *UNUSUAL SHORTNESS OF BREATH  *UNUSUAL BRUISING OR BLEEDING  TENDERNESS IN MOUTH AND THROAT WITH OR WITHOUT PRESENCE OF ULCERS  *URINARY PROBLEMS  *BOWEL PROBLEMS  UNUSUAL RASH Items with * indicate a potential emergency and should be followed up as soon as possible.  Feel free to call the clinic should you have any questions or concerns. The clinic phone number is [phone].  Please show the CHEMO ALERT CARD at check-in to the Emergency Department and triage nurse.   

## 2020-03-09 NOTE — Progress Notes (Signed)
Patient was assessed by Dr. Delton Coombes and labs have been reviewed.  Patient is okay to proceed with treatment today. Patient will also get her schedule post transplant immunizations. Primary RN and pharmacy aware.

## 2020-03-15 ENCOUNTER — Other Ambulatory Visit: Payer: Self-pay | Admitting: *Deleted

## 2020-03-15 ENCOUNTER — Ambulatory Visit: Payer: Self-pay | Admitting: *Deleted

## 2020-03-15 NOTE — Patient Outreach (Signed)
[facility]) Care Management  03/15/2020  TAMMY WICKLIFFE 1966-03-15 [id number]   Subjective: Telephone call to patient's home  / mobile number, no answer, left HIPAA compliant voicemail message, and requested call back.     Objective:Per KPN (Knowledge Performance Now, point of care tool) and chart review,patient hospitalized 12/26/2019 - 12/28/2019 forStroke (cerebrum),confusion, speech deficits primarily expressive aphasia, . Patient also has a history of hypertension, diabetes, Multiple myeloma([facility] and currently onMaintenance Velcade every 2 weeks and Revlimid 10 mg 3 weeks on/1 week off started on 07/29/2018) , Anemia, COVID 19 (was Positive 09/17/2019, Negative 12/23/19,Negativeon 12/26/19), UTI, gout,kidney stones,Pancytopenia with moderatethrombocytopenia, and Migraines.    Assessment:Received [employer] EMMI Stroke Google Alert on 01/12/2020. Red Flag Alert Trigger, Day #13, patient answered no to the following question: Went to follow-up appointment?EMMI follow up completed and will follow up to assess further care management needs.      Plan:RNCM will call patient for 2nd telephone outreach attempt, within4business days, to assess for further CM needs, and proceed with case closure, after 4th unsuccessful outreach.     Inga Noller H. Annia Friendly, BSN, [place] Telephonic CM Phone: [phone] Fax: [phone]

## 2020-03-16 ENCOUNTER — Other Ambulatory Visit: Payer: Self-pay | Admitting: *Deleted

## 2020-03-16 NOTE — Patient Outreach (Signed)
[facility]) Care Management  03/16/2020  Joanna Reid 09/02/1965 [id number]   Subjective: Telephone call to patient's home / mobile number, spoke with patient, and HIPAA verified.  Patient states she is doing "gucci", very blessed, had a follow up appointment with oncologist on 03/09/2020, and appointment went well.   States she has resumed her chemo shots every 2 weeks and other chemo is on hold indefinitely due to increase blood clot risk.   States she has received Advanced Directives information, reviewing it with family, and will let RNCM know if any questions.   Patient states she is in the process of following up on financial assistance for accumulating medical bills through the cancer center, is filing her critical illness supplemental insurance benefits, and will follow up with [facility] regarding payment plan.  RNCM verbally gave patient  contact number for [facility] Patient [facility]ing [phone] or [phone], and she is also planning to request itemized bill.    Patient states she does not have any education material, EMMI follow up, care coordination, care management, disease monitoring, transportation, community resource, or pharmacy needs at this time.   Patient is aware 30 day EMMI automated calls are completed.   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    Objective:Per KPN (Knowledge Performance Now, point of care tool) and chart review,patient hospitalized 12/26/2019 - 12/28/2019 forStroke (cerebrum),confusion, speech deficits primarily expressive aphasia, . Patient also has a history of hypertension, diabetes, Multiple myeloma(statuspostSt[facility] and currently onMaintenance Velcade every 2 weeks and Revlimid 10 mg 3 weeks on/1 week off started on 07/29/2018) , Anemia, COVID 19 (was Positive 09/17/2019, Negative 12/23/19,Negativeon 12/26/19), UTI,  gout,kidney stones,Pancytopenia with moderatethrombocytopenia, and Migraines.    Assessment:Received [employer] EMMI Stroke Google Alert on 01/12/2020. Red Flag Alert Trigger, Day #13, patient answered no to the following question: Went to follow-up appointment?EMMI follow up completed and no further care management needs.       Plan:RNCM will complete case closure due to follow up completed / no care management needs.       Joanna Reid H. Annia Friendly, BSN, [place] [facility] Telephonic CM Phone: [phone] Fax: [phone]

## 2020-03-23 ENCOUNTER — Inpatient Hospital Stay (HOSPITAL_COMMUNITY): Payer: BC Managed Care – PPO

## 2020-03-23 ENCOUNTER — Encounter (HOSPITAL_COMMUNITY): Payer: Self-pay

## 2020-03-23 ENCOUNTER — Other Ambulatory Visit: Payer: Self-pay

## 2020-03-23 VITALS — BP 118/78 | HR 70 | Temp 96.9°F | Resp 18 | Wt 253.6 lb

## 2020-03-23 DIAGNOSIS — Z5112 Encounter for antineoplastic immunotherapy: Secondary | ICD-10-CM | POA: Diagnosis not present

## 2020-03-23 DIAGNOSIS — C9000 Multiple myeloma not having achieved remission: Secondary | ICD-10-CM

## 2020-03-23 LAB — CBC WITH DIFFERENTIAL/PLATELET
Abs Immature Granulocytes: 0.01 10*3/uL (ref 0.00–0.07)
Basophils Absolute: 0 10*3/uL (ref 0.0–0.1)
Basophils Relative: 1 %
Eosinophils Absolute: 0 10*3/uL (ref 0.0–0.5)
Eosinophils Relative: 1 %
HCT: 35.3 % — ABNORMAL LOW (ref 36.0–46.0)
Hemoglobin: 11.3 g/dL — ABNORMAL LOW (ref 12.0–15.0)
Immature Granulocytes: 0 %
Lymphocytes Relative: 29 %
Lymphs Abs: 1.1 10*3/uL (ref 0.7–4.0)
MCH: 30.7 pg (ref 26.0–34.0)
MCHC: 32 g/dL (ref 30.0–36.0)
MCV: 95.9 fL (ref 80.0–100.0)
Monocytes Absolute: 0.5 10*3/uL (ref 0.1–1.0)
Monocytes Relative: 14 %
Neutro Abs: 2.1 10*3/uL (ref 1.7–7.7)
Neutrophils Relative %: 55 %
Platelets: 172 10*3/uL (ref 150–400)
RBC: 3.68 MIL/uL — ABNORMAL LOW (ref 3.87–5.11)
RDW: 14.6 % (ref 11.5–15.5)
WBC: 3.8 10*3/uL — ABNORMAL LOW (ref 4.0–10.5)
nRBC: 0 % (ref 0.0–0.2)

## 2020-03-23 LAB — COMPREHENSIVE METABOLIC PANEL
ALT: 14 U/L (ref 0–44)
AST: 20 U/L (ref 15–41)
Albumin: 4 g/dL (ref 3.5–5.0)
Alkaline Phosphatase: 61 U/L (ref 38–126)
Anion gap: 10 (ref 5–15)
BUN: 27 mg/dL — ABNORMAL HIGH (ref 6–20)
CO2: 26 mmol/L (ref 22–32)
Calcium: 9.6 mg/dL (ref 8.9–10.3)
Chloride: 104 mmol/L (ref 98–111)
Creatinine, Ser: 1.21 mg/dL — ABNORMAL HIGH (ref 0.44–1.00)
GFR calc Af Amer: 58 mL/min — ABNORMAL LOW (ref >60)
GFR calc non Af Amer: 50 mL/min — ABNORMAL LOW (ref >60)
Glucose, Bld: 115 mg/dL — ABNORMAL HIGH (ref 70–99)
Potassium: 3.9 mmol/L (ref 3.5–5.1)
Sodium: 140 mmol/L (ref 135–145)
Total Bilirubin: 0.8 mg/dL (ref 0.3–1.2)
Total Protein: 7.2 g/dL (ref 6.5–8.1)

## 2020-03-23 LAB — MAGNESIUM: Magnesium: 1.7 mg/dL (ref 1.7–2.4)

## 2020-03-23 LAB — LACTATE DEHYDROGENASE: LDH: 131 U/L (ref 98–192)

## 2020-03-23 MED ORDER — ZOLEDRONIC ACID 4 MG/5ML IV CONC
3.0000 mg | Freq: Once | INTRAVENOUS | Status: AC
  Administered 2020-03-23: 3 mg via INTRAVENOUS
  Filled 2020-03-23: qty 3.75

## 2020-03-23 MED ORDER — PROCHLORPERAZINE MALEATE 10 MG PO TABS: 10.0000 mg | ORAL_TABLET | Freq: Once | ORAL | Status: DC

## 2020-03-23 MED ORDER — SODIUM CHLORIDE 0.9 % IV SOLN: Freq: Once | INTRAVENOUS | Status: AC

## 2020-03-23 MED ORDER — BORTEZOMIB CHEMO SQ INJECTION 3.5 MG (2.5MG/ML)
1.3000 mg/m2 | Freq: Once | INTRAMUSCULAR | Status: AC
  Administered 2020-03-23: 3 mg via SUBCUTANEOUS
  Filled 2020-03-23: qty 1.2

## 2020-03-23 NOTE — Progress Notes (Signed)
Patient presents today for Velcade injection and Zometa. Vital signs within parameters for treatment. Labs within parameters for treatment. Labs reviewed by Dr. Delton Coombes. Message received to proceed with treatment per MD.   Treatment given today per MD orders. Tolerated without adverse affects. Vital signs stable. No complaints at this time. Discharged from clinic ambulatory. F/U with [facility] as scheduled.

## 2020-03-23 NOTE — Patient Instructions (Signed)
[facility] Discharge Instructions for Patients Receiving Chemotherapy  Today you received the following chemotherapy agents   To help prevent nausea and vomiting after your treatment, we encourage you to take your nausea medication   If you develop nausea and vomiting that is not controlled by your nausea medication, call the clinic.   BELOW ARE SYMPTOMS THAT SHOULD BE REPORTED IMMEDIATELY:  *FEVER GREATER THAN 100.5 F  *CHILLS WITH OR WITHOUT FEVER  NAUSEA AND VOMITING THAT IS NOT CONTROLLED WITH YOUR NAUSEA MEDICATION  *UNUSUAL SHORTNESS OF BREATH  *UNUSUAL BRUISING OR BLEEDING  TENDERNESS IN MOUTH AND THROAT WITH OR WITHOUT PRESENCE OF ULCERS  *URINARY PROBLEMS  *BOWEL PROBLEMS  UNUSUAL RASH Items with * indicate a potential emergency and should be followed up as soon as possible.  Feel free to call the clinic should you have any questions or concerns. The clinic phone number is [phone].  Please show the CHEMO ALERT CARD at check-in to the Emergency Department and triage nurse.   

## 2020-04-06 ENCOUNTER — Inpatient Hospital Stay (HOSPITAL_COMMUNITY): Payer: BC Managed Care – PPO

## 2020-04-06 ENCOUNTER — Encounter (HOSPITAL_COMMUNITY): Payer: Self-pay

## 2020-04-06 ENCOUNTER — Other Ambulatory Visit: Payer: Self-pay

## 2020-04-06 VITALS — BP 121/71 | HR 69 | Temp 96.9°F | Resp 18 | Wt 257.8 lb

## 2020-04-06 DIAGNOSIS — Z5112 Encounter for antineoplastic immunotherapy: Secondary | ICD-10-CM | POA: Diagnosis not present

## 2020-04-06 DIAGNOSIS — C9000 Multiple myeloma not having achieved remission: Secondary | ICD-10-CM

## 2020-04-06 DIAGNOSIS — T451X5A Adverse effect of antineoplastic and immunosuppressive drugs, initial encounter: Secondary | ICD-10-CM

## 2020-04-06 DIAGNOSIS — G620 Drug-induced polyneuropathy: Secondary | ICD-10-CM

## 2020-04-06 LAB — CBC WITH DIFFERENTIAL/PLATELET
Abs Immature Granulocytes: 0.01 10*3/uL (ref 0.00–0.07)
Basophils Absolute: 0 10*3/uL (ref 0.0–0.1)
Basophils Relative: 1 %
Eosinophils Absolute: 0 10*3/uL (ref 0.0–0.5)
Eosinophils Relative: 1 %
HCT: 34 % — ABNORMAL LOW (ref 36.0–46.0)
Hemoglobin: 11.1 g/dL — ABNORMAL LOW (ref 12.0–15.0)
Immature Granulocytes: 0 %
Lymphocytes Relative: 28 %
Lymphs Abs: 1.3 10*3/uL (ref 0.7–4.0)
MCH: 30.6 pg (ref 26.0–34.0)
MCHC: 32.6 g/dL (ref 30.0–36.0)
MCV: 93.7 fL (ref 80.0–100.0)
Monocytes Absolute: 0.5 10*3/uL (ref 0.1–1.0)
Monocytes Relative: 12 %
Neutro Abs: 2.8 10*3/uL (ref 1.7–7.7)
Neutrophils Relative %: 58 %
Platelets: 170 10*3/uL (ref 150–400)
RBC: 3.63 MIL/uL — ABNORMAL LOW (ref 3.87–5.11)
RDW: 14.6 % (ref 11.5–15.5)
WBC: 4.7 10*3/uL (ref 4.0–10.5)
nRBC: 0 % (ref 0.0–0.2)

## 2020-04-06 LAB — COMPREHENSIVE METABOLIC PANEL
ALT: 14 U/L (ref 0–44)
AST: 19 U/L (ref 15–41)
Albumin: 3.8 g/dL (ref 3.5–5.0)
Alkaline Phosphatase: 62 U/L (ref 38–126)
Anion gap: 8 (ref 5–15)
BUN: 27 mg/dL — ABNORMAL HIGH (ref 6–20)
CO2: 25 mmol/L (ref 22–32)
Calcium: 9.1 mg/dL (ref 8.9–10.3)
Chloride: 108 mmol/L (ref 98–111)
Creatinine, Ser: 1.35 mg/dL — ABNORMAL HIGH (ref 0.44–1.00)
GFR calc Af Amer: 51 mL/min — ABNORMAL LOW (ref >60)
GFR calc non Af Amer: 44 mL/min — ABNORMAL LOW (ref >60)
Glucose, Bld: 121 mg/dL — ABNORMAL HIGH (ref 70–99)
Potassium: 3.8 mmol/L (ref 3.5–5.1)
Sodium: 141 mmol/L (ref 135–145)
Total Bilirubin: 0.7 mg/dL (ref 0.3–1.2)
Total Protein: 6.7 g/dL (ref 6.5–8.1)

## 2020-04-06 LAB — MAGNESIUM: Magnesium: 1.8 mg/dL (ref 1.7–2.4)

## 2020-04-06 LAB — LACTATE DEHYDROGENASE: LDH: 133 U/L (ref 98–192)

## 2020-04-06 MED ORDER — PREGABALIN 200 MG PO CAPS: ORAL_CAPSULE | ORAL | 6 refills | Status: DC

## 2020-04-06 MED ORDER — BORTEZOMIB CHEMO SQ INJECTION 3.5 MG (2.5MG/ML)
1.3000 mg/m2 | Freq: Once | INTRAMUSCULAR | Status: AC
  Administered 2020-04-06: 3 mg via SUBCUTANEOUS
  Filled 2020-04-06: qty 1.2

## 2020-04-06 MED ORDER — PROCHLORPERAZINE MALEATE 10 MG PO TABS: 10.0000 mg | ORAL_TABLET | Freq: Once | ORAL | Status: DC

## 2020-04-06 NOTE — Progress Notes (Signed)
Joanna Reid tolerated Velcade injection well without complaints or incident. Labs reviewed prior to administering this medication. VSS Pt discharged self ambulatory in satisfactory condition

## 2020-04-06 NOTE — Patient Instructions (Signed)
[facility] Discharge Instructions for Patients Receiving Chemotherapy   Beginning January 23rd 2017 lab work for the Cancer Center will be done in the  Main lab at [facility] on 1st floor. If you have a lab appointment with the Cancer Center please come in thru the  Main Entrance and check in at the main information desk   Today you received the following chemotherapy agents Velcade. Follow-up as scheduled  To help prevent nausea and vomiting after your treatment, we encourage you to take your nausea medication   If you develop nausea and vomiting, or diarrhea that is not controlled by your medication, call the clinic.  The clinic phone number is [phone]. Office hours are Monday-Friday 8:30am-5:00pm.  BELOW ARE SYMPTOMS THAT SHOULD BE REPORTED IMMEDIATELY:  *FEVER GREATER THAN 101.0 F  *CHILLS WITH OR WITHOUT FEVER  NAUSEA AND VOMITING THAT IS NOT CONTROLLED WITH YOUR NAUSEA MEDICATION  *UNUSUAL SHORTNESS OF BREATH  *UNUSUAL BRUISING OR BLEEDING  TENDERNESS IN MOUTH AND THROAT WITH OR WITHOUT PRESENCE OF ULCERS  *URINARY PROBLEMS  *BOWEL PROBLEMS  UNUSUAL RASH Items with * indicate a potential emergency and should be followed up as soon as possible. If you have an emergency after office hours please contact your primary care physician or go to the nearest emergency department.  Please call the clinic during office hours if you have any questions or concerns.   You may also contact the Patient Navigator at [phone] should you have any questions or need assistance in obtaining follow up care.      Resources For Cancer Patients and their Caregivers ? American Cancer Society: Can assist with transportation, wigs, general needs, runs Look Good Feel Better.        1-888-227-6333 ? Cancer Care: Provides financial assistance, online support groups, medication/co-pay assistance.  1-800-813-HOPE (4673) ? [facility] Assists [place] cancer patients and their families through emotional , educational and financial support.  336-427-4357 ? [place] DSS Where to apply for food stamps, Medicaid and utility assistance. 336-342-1394 ? [facility]: Transportation to medical appointments. 336-347-2287 ? Social Security Administration: May apply for disability if have a Stage IV cancer. 336-342-7796 1-800-772-1213 ? [place] Aging, Disability and Transit Services: Assists with nutrition, care and transit needs. 336-349-2343         

## 2020-04-07 ENCOUNTER — Encounter: Payer: Self-pay | Admitting: Adult Health

## 2020-04-09 ENCOUNTER — Other Ambulatory Visit (HOSPITAL_COMMUNITY): Payer: Self-pay | Admitting: Hematology

## 2020-04-09 DIAGNOSIS — C9000 Multiple myeloma not having achieved remission: Secondary | ICD-10-CM

## 2020-04-09 DIAGNOSIS — T451X5A Adverse effect of antineoplastic and immunosuppressive drugs, initial encounter: Secondary | ICD-10-CM

## 2020-04-14 ENCOUNTER — Telehealth: Payer: Self-pay | Admitting: Family Medicine

## 2020-04-14 MED ORDER — DIAZEPAM 5 MG PO TABS
5.0000 mg | ORAL_TABLET | Freq: Every day | ORAL | 0 refills | Status: DC
Start: 2020-04-14 — End: 2020-04-25

## 2020-04-14 NOTE — Telephone Encounter (Signed)
Script faxed to pharmacy

## 2020-04-14 NOTE — Addendum Note (Signed)
Addended by: [name] on: 04/14/2020 01:06 PM   Modules accepted: Orders

## 2020-04-14 NOTE — Telephone Encounter (Signed)
[facility] requesting refill on Valium 5 mg tablet. Take one tablet by mouth at bedtime. Pt last seen 12/31/19. Please advise. Thank you

## 2020-04-20 ENCOUNTER — Inpatient Hospital Stay (HOSPITAL_COMMUNITY): Payer: BC Managed Care – PPO | Attending: Hematology

## 2020-04-20 ENCOUNTER — Other Ambulatory Visit: Payer: Self-pay

## 2020-04-20 ENCOUNTER — Encounter (HOSPITAL_COMMUNITY): Payer: Self-pay

## 2020-04-20 ENCOUNTER — Inpatient Hospital Stay (HOSPITAL_COMMUNITY): Payer: BC Managed Care – PPO

## 2020-04-20 VITALS — BP 140/85 | HR 61 | Temp 97.1°F | Resp 18 | Wt 251.2 lb

## 2020-04-20 DIAGNOSIS — Z5112 Encounter for antineoplastic immunotherapy: Secondary | ICD-10-CM | POA: Insufficient documentation

## 2020-04-20 DIAGNOSIS — C9000 Multiple myeloma not having achieved remission: Secondary | ICD-10-CM | POA: Diagnosis present

## 2020-04-20 DIAGNOSIS — Z23 Encounter for immunization: Secondary | ICD-10-CM | POA: Insufficient documentation

## 2020-04-20 LAB — COMPREHENSIVE METABOLIC PANEL
ALT: 13 U/L (ref 0–44)
AST: 18 U/L (ref 15–41)
Albumin: 4.2 g/dL (ref 3.5–5.0)
Alkaline Phosphatase: 60 U/L (ref 38–126)
Anion gap: 11 (ref 5–15)
BUN: 30 mg/dL — ABNORMAL HIGH (ref 6–20)
CO2: 26 mmol/L (ref 22–32)
Calcium: 9.7 mg/dL (ref 8.9–10.3)
Chloride: 102 mmol/L (ref 98–111)
Creatinine, Ser: 1.18 mg/dL — ABNORMAL HIGH (ref 0.44–1.00)
GFR, Estimated: 52 mL/min — ABNORMAL LOW (ref >60)
Glucose, Bld: 112 mg/dL — ABNORMAL HIGH (ref 70–99)
Potassium: 4 mmol/L (ref 3.5–5.1)
Sodium: 139 mmol/L (ref 135–145)
Total Bilirubin: 0.9 mg/dL (ref 0.3–1.2)
Total Protein: 7.2 g/dL (ref 6.5–8.1)

## 2020-04-20 LAB — CBC WITH DIFFERENTIAL/PLATELET
Abs Immature Granulocytes: 0.01 10*3/uL (ref 0.00–0.07)
Basophils Absolute: 0 10*3/uL (ref 0.0–0.1)
Basophils Relative: 1 %
Eosinophils Absolute: 0 10*3/uL (ref 0.0–0.5)
Eosinophils Relative: 1 %
HCT: 36 % (ref 36.0–46.0)
Hemoglobin: 11.8 g/dL — ABNORMAL LOW (ref 12.0–15.0)
Immature Granulocytes: 0 %
Lymphocytes Relative: 28 %
Lymphs Abs: 1.1 10*3/uL (ref 0.7–4.0)
MCH: 30.6 pg (ref 26.0–34.0)
MCHC: 32.8 g/dL (ref 30.0–36.0)
MCV: 93.5 fL (ref 80.0–100.0)
Monocytes Absolute: 0.5 10*3/uL (ref 0.1–1.0)
Monocytes Relative: 12 %
Neutro Abs: 2.2 10*3/uL (ref 1.7–7.7)
Neutrophils Relative %: 58 %
Platelets: 182 10*3/uL (ref 150–400)
RBC: 3.85 MIL/uL — ABNORMAL LOW (ref 3.87–5.11)
RDW: 14.4 % (ref 11.5–15.5)
WBC: 3.8 10*3/uL — ABNORMAL LOW (ref 4.0–10.5)
nRBC: 0 % (ref 0.0–0.2)

## 2020-04-20 LAB — MAGNESIUM: Magnesium: 1.9 mg/dL (ref 1.7–2.4)

## 2020-04-20 LAB — LACTATE DEHYDROGENASE: LDH: 134 U/L (ref 98–192)

## 2020-04-20 MED ORDER — BORTEZOMIB CHEMO SQ INJECTION 3.5 MG (2.5MG/ML)
1.3000 mg/m2 | Freq: Once | INTRAMUSCULAR | Status: AC
  Administered 2020-04-20: 3 mg via SUBCUTANEOUS
  Filled 2020-04-20: qty 1.2

## 2020-04-20 MED ORDER — PROCHLORPERAZINE MALEATE 10 MG PO TABS: 10.0000 mg | ORAL_TABLET | Freq: Once | ORAL | Status: DC

## 2020-04-20 NOTE — Progress Notes (Signed)
Joanna Reid tolerated Velcade injection well without complaints or incident. Labs reviewed prior to administering this medication. VSS Pt discharged self ambulatory in satisfactory condition

## 2020-04-20 NOTE — Patient Instructions (Signed)
[facility] Discharge Instructions for Patients Receiving Chemotherapy   Beginning January 23rd 2017 lab work for the Cancer Center will be done in the  Main lab at [place] on 1st floor. If you have a lab appointment with the Cancer Center please come in thru the  Main Entrance and check in at the main information desk   Today you received the following chemotherapy agents Velcade injection. Follow-up as scheduled  To help prevent nausea and vomiting after your treatment, we encourage you to take your nausea medication   If you develop nausea and vomiting, or diarrhea that is not controlled by your medication, call the clinic.  The clinic phone number is [phone]. Office hours are Monday-Friday 8:30am-5:00pm.  BELOW ARE SYMPTOMS THAT SHOULD BE REPORTED IMMEDIATELY:  *FEVER GREATER THAN 101.0 F  *CHILLS WITH OR WITHOUT FEVER  NAUSEA AND VOMITING THAT IS NOT CONTROLLED WITH YOUR NAUSEA MEDICATION  *UNUSUAL SHORTNESS OF BREATH  *UNUSUAL BRUISING OR BLEEDING  TENDERNESS IN MOUTH AND THROAT WITH OR WITHOUT PRESENCE OF ULCERS  *URINARY PROBLEMS  *BOWEL PROBLEMS  UNUSUAL RASH Items with * indicate a potential emergency and should be followed up as soon as possible. If you have an emergency after office hours please contact your primary care physician or go to the nearest emergency department.  Please call the clinic during office hours if you have any questions or concerns.   You may also contact the Patient Navigator at [phone] should you have any questions or need assistance in obtaining follow up care.      Resources For Cancer Patients and their Caregivers ? American Cancer Society: Can assist with transportation, wigs, general needs, runs Look Good Feel Better.        1-888-227-6333 ? Cancer Care: Provides financial assistance, online support groups, medication/co-pay assistance.  1-800-813-HOPE (4673) ? [facility] Assists [place] cancer patients and their families through emotional , educational and financial support.  336-427-4357 ? [place] DSS Where to apply for food stamps, Medicaid and utility assistance. 336-342-1394 ? [facility]: Transportation to medical appointments. 336-347-2287 ? Social Security Administration: May apply for disability if have a Stage IV cancer. 336-342-7796 1-800-772-1213 ? [place] Aging, Disability and Transit Services: Assists with nutrition, care and transit needs. 336-349-2343         

## 2020-04-21 ENCOUNTER — Other Ambulatory Visit (HOSPITAL_COMMUNITY): Payer: Self-pay | Admitting: Psychiatry

## 2020-04-21 LAB — KAPPA/LAMBDA LIGHT CHAINS
Kappa free light chain: 31.5 mg/L — ABNORMAL HIGH (ref 3.3–19.4)
Kappa, lambda light chain ratio: 1.98 — ABNORMAL HIGH (ref 0.26–1.65)
Lambda free light chains: 15.9 mg/L (ref 5.7–26.3)

## 2020-04-22 LAB — PROTEIN ELECTROPHORESIS, SERUM
A/G Ratio: 1.4 (ref 0.7–1.7)
Albumin ELP: 3.9 g/dL (ref 2.9–4.4)
Alpha-1-Globulin: 0.2 g/dL (ref 0.0–0.4)
Alpha-2-Globulin: 0.7 g/dL (ref 0.4–1.0)
Beta Globulin: 0.9 g/dL (ref 0.7–1.3)
Gamma Globulin: 0.9 g/dL (ref 0.4–1.8)
Globulin, Total: 2.8 g/dL (ref 2.2–3.9)
Total Protein ELP: 6.7 g/dL (ref 6.0–8.5)

## 2020-04-25 ENCOUNTER — Encounter: Payer: Self-pay | Admitting: Family Medicine

## 2020-04-25 ENCOUNTER — Ambulatory Visit (INDEPENDENT_AMBULATORY_CARE_PROVIDER_SITE_OTHER): Payer: BC Managed Care – PPO | Admitting: Family Medicine

## 2020-04-25 VITALS — BP 124/78 | HR 57 | Temp 95.2°F | Ht 64.0 in | Wt 253.4 lb

## 2020-04-25 DIAGNOSIS — C9000 Multiple myeloma not having achieved remission: Secondary | ICD-10-CM

## 2020-04-25 DIAGNOSIS — F411 Generalized anxiety disorder: Secondary | ICD-10-CM | POA: Diagnosis not present

## 2020-04-25 DIAGNOSIS — F430 Acute stress reaction: Secondary | ICD-10-CM

## 2020-04-25 DIAGNOSIS — K219 Gastro-esophageal reflux disease without esophagitis: Secondary | ICD-10-CM

## 2020-04-25 DIAGNOSIS — R300 Dysuria: Secondary | ICD-10-CM | POA: Diagnosis not present

## 2020-04-25 DIAGNOSIS — E119 Type 2 diabetes mellitus without complications: Secondary | ICD-10-CM | POA: Diagnosis not present

## 2020-04-25 LAB — IMMUNOFIXATION ELECTROPHORESIS
IgA: 121 mg/dL (ref 87–352)
IgG (Immunoglobin G), Serum: 1005 mg/dL (ref 586–1602)
IgM (Immunoglobulin M), Srm: 18 mg/dL — ABNORMAL LOW (ref 26–217)
Total Protein ELP: 6.9 g/dL (ref 6.0–8.5)

## 2020-04-25 LAB — POCT URINALYSIS DIPSTICK (MANUAL)
Leukocytes, UA: NEGATIVE
Nitrite, UA: NEGATIVE
Poct Bilirubin: NEGATIVE
Poct Blood: NEGATIVE
Poct Glucose: NORMAL mg/dL
Poct Ketones: NEGATIVE
Poct Protein: NEGATIVE mg/dL
Poct Urobilinogen: NORMAL mg/dL
Spec Grav, UA: 1.01 (ref 1.010–1.025)
pH, UA: 5 (ref 5.0–8.0)

## 2020-04-25 MED ORDER — PANTOPRAZOLE SODIUM 40 MG PO TBEC: 40.0000 mg | DELAYED_RELEASE_TABLET | Freq: Every day | ORAL | 1 refills | Status: DC

## 2020-04-25 MED ORDER — DIAZEPAM 5 MG PO TABS: 5.0000 mg | ORAL_TABLET | Freq: Every day | ORAL | 2 refills | Status: DC

## 2020-04-25 NOTE — Progress Notes (Signed)
Patient ID: Joanna Reid, female    DOB: 1965-10-13, 54 y.o.   MRN: [id number]   Chief Complaint  Patient presents with  . Hypertension   Subjective:    HPI Pt here for follow up.   gerd- Pt has script for Pantoprazole 40 mg from when she had her stroke and is needing PCP to take over.   Recurrent UTIs- Pt is seeing doctors down at [facility] and they state that "he doesn't know what to do with her". Pt has had 4 different UTIs since June. [facility] dr recommended IV med for UTIs. Seen urologist at [facility], due to kidney stone during stem cell transplant. Was given low dose macrobid by old urologist due to her UTIs. Last time took cipro 569m from the [employer]  [facility]  Pt started on Cystex tablet and liquid Cystex maintenance liquid. Sometimes help sometime pt can tell when its not going to help. Strong odor to urine.   [facility]   [facility] And trulicity 1x per week.  Doing well after neuro stroke. Seeing 05/09/20.  And seeing q3m32month Has h/o multiple myeloma, seeing Dr. KatJamey Reaseme/onc. Doing infusion treatment. q 2 wks. - velcade Since stroke, was on a med for 21 days. - revlimid Then 1 week off.  Had stem cell transplant on 8/19.  They are concerned about stroke and blood clotting, so they are modifying the treatment.  Pt had discontinued the baby asa prior to the colonoscopy and woke up next day with stroke symptoms.   Medical History RobAkelas a past medical history of Acid reflux, Allergic rhinitis, Cancer (HCCPepinDiabetes mellitus, Gout, Gout, HBP (high blood pressure), History of kidney stones, and Migraines.   Outpatient Encounter Medications as of 04/25/2020  Medication Sig  . acyclovir (ZOVIRAX) 400 MG tablet TAKE (1) TABLET BY MOUTH TWICE DAILY.  . aMarland Kitchenbuterol (PROVENTIL HFA;VENTOLIN HFA) 108 (90 Base) MCG/ACT inhaler Inhale 2 puffs  into the lungs every 4 (four) hours as needed for wheezing or shortness of breath.  . allopurinol (ZYLOPRIM) 300 MG tablet TAKE 1 TABLET DAILY (Patient taking differently: Take 300 mg by mouth daily. )  . amLODipine (NORVASC) 5 MG tablet Take 5 mg by mouth daily.  . aMarland Kitchenpirin EC 81 MG tablet Take 1 tablet (81 mg total) by mouth daily.  . bumetanide (BUMEX) 0.5 MG tablet Take 0.5 mg by mouth daily.   . carvedilol (COREG) 25 MG tablet Take 1 tablet (25 mg total) by mouth 2 (two) times daily.  . diazepam (VALIUM) 5 MG tablet Take 1 tablet (5 mg total) by mouth at bedtime.  . Docosanol (ABREVA) 10 % CREA Apply 1 application topically daily as needed (for nose).   . dMarland Kitchencusate sodium (COLACE) 100 MG capsule Take 100 mg by mouth daily.   . fluconazole (DIFLUCAN) 150 MG tablet   . fluticasone (FLONASE) 50 MCG/ACT nasal spray Place 2 sprays into both nostrils daily.  . lMarland Kitchensartan (COZAAR) 100 MG tablet Take 100 mg by mouth daily.  . Magnesium 200 MG TABS Take 600 mg by mouth daily.   . magnesium oxide (MAG-OX) 400 (241.3 Mg) MG tablet Take 1 tablet (400 mg total) by mouth 3 (three) times daily.  . metFORMIN (GLUCOPHAGE) 500 MG tablet TAKE 1 TABLET BY MOUTH ONCE DAILY WITH BREAKFAST (Patient taking differently: Take 1,000 mg by mouth 2 (two) times  daily with a meal. )  . pregabalin (LYRICA) 200 MG capsule TAKE (1) CAPSULE BY MOUTH TWICE DAILY.  Marland Kitchen prochlorperazine (COMPAZINE) 10 MG tablet every 14 (fourteen) days (Patient not taking: Reported on 05/04/2020)  . Propylene Glycol (SYSTANE BALANCE) 0.6 % SOLN Apply 1 drop to eye daily as needed (dry eye).   . rosuvastatin (CRESTOR) 10 MG tablet Take 1 tablet (10 mg total) by mouth daily.  . TRULICITY 1.5 RJ/1.8AC SOPN Inject 0.75 mg into the skin every Monday.   . valACYclovir (VALTREX) 1000 MG tablet TAKE 2 TABLETS NOW, THEN 2 TABLETS 12 HOURS LATER.  . [DISCONTINUED] ciprofloxacin (CIPRO) 500 MG tablet Take 500 mg by mouth 2 (two) times daily.  . [DISCONTINUED]  diazepam (VALIUM) 5 MG tablet Take 1 tablet (5 mg total) by mouth at bedtime.  . pantoprazole (PROTONIX) 40 MG tablet Take 1 tablet (40 mg total) by mouth daily.   No facility-administered encounter medications on file as of 04/25/2020.     Review of Systems  Constitutional: Negative for chills and fever.  HENT: Negative for congestion, rhinorrhea and sore throat.   Respiratory: Negative for cough, shortness of breath and wheezing.   Cardiovascular: Negative for chest pain and leg swelling.  Gastrointestinal: Negative for abdominal pain, diarrhea, nausea and vomiting.  Genitourinary: Negative for dysuria and frequency.  Musculoskeletal: Negative for arthralgias and back pain.  Skin: Negative for rash.  Neurological: Negative for dizziness, weakness and headaches.     Vitals BP 124/78   Pulse (!) 57   Temp (!) 95.2 F (35.1 C)   Ht _0  (1.626 m)   Wt 253 lb 6.4 oz (114.9 kg)   SpO2 100%   BMI 43.50 kg/m   Objective:   Physical Exam Vitals and nursing note reviewed.  Constitutional:      Appearance: Normal appearance.  HENT:     Head: Normocephalic and atraumatic.     Nose: Nose normal.     Mouth/Throat:     Mouth: Mucous membranes are moist.     Pharynx: Oropharynx is clear.  Eyes:     Extraocular Movements: Extraocular movements intact.     Conjunctiva/sclera: Conjunctivae normal.     Pupils: Pupils are equal, round, and reactive to light.  Cardiovascular:     Rate and Rhythm: Regular rhythm. Bradycardia present.     Pulses: Normal pulses.     Heart sounds: Normal heart sounds.  Pulmonary:     Effort: Pulmonary effort is normal.     Breath sounds: Normal breath sounds. No wheezing, rhonchi or rales.  Musculoskeletal:        General: Normal range of motion.     Right lower leg: No edema.     Left lower leg: No edema.  Skin:    General: Skin is warm and dry.     Findings: No lesion or rash.  Neurological:     General: No focal deficit present.     Mental  Status: She is alert and oriented to person, place, and time.  Psychiatric:        Mood and Affect: Mood normal.        Behavior: Behavior normal.      Assessment and Plan   1. Dysuria - POCT Urinalysis Dip Manual - Urine Culture  2. Multiple myeloma, remission status unspecified ([place])  3. Type 2 diabetes mellitus without complication, without long-term current use of insulin ([place])  4. Anxiety as acute reaction to exceptional stress - diazepam (VALIUM) 5  MG tablet; Take 1 tablet (5 mg total) by mouth at bedtime.  Dispense: 30 tablet; Refill: 2  5. Gastroesophageal reflux disease, unspecified whether esophagitis present - pantoprazole (PROTONIX) 40 MG tablet; Take 1 tablet (40 mg total) by mouth daily.  Dispense: 90 tablet; Refill: 1    gerd- stable. Pt needing refill for protonix. [place]. H/o cva- cont f/u with neuro at [facility]. H/o recurrent UTIs- And has uti's and taking cystex. Check UA today.  - Cont with f/u with Dr. Sherol Dade or with local Urologist if having recurrent UTIs. Dysuria- UA -negative.  Will send for culture.  Will call in abx if needed. DM2-stable. Cont meds.  Anxiety-stable. pt using valium sparingly.  Will refill.   Fu 3 mo or prn.

## 2020-04-27 ENCOUNTER — Telehealth: Payer: Self-pay | Admitting: Family Medicine

## 2020-04-27 ENCOUNTER — Other Ambulatory Visit: Payer: Self-pay | Admitting: *Deleted

## 2020-04-27 DIAGNOSIS — N390 Urinary tract infection, site not specified: Secondary | ICD-10-CM

## 2020-04-27 MED ORDER — CIPROFLOXACIN HCL 250 MG PO TABS: 250.0000 mg | ORAL_TABLET | Freq: Two times a day (BID) | ORAL | 0 refills | Status: DC

## 2020-04-27 NOTE — Telephone Encounter (Signed)
Pt.notified

## 2020-04-28 LAB — URINE CULTURE

## 2020-04-29 ENCOUNTER — Other Ambulatory Visit: Payer: Self-pay | Admitting: Family Medicine

## 2020-04-29 MED ORDER — NITROFURANTOIN MONOHYD MACRO 100 MG PO CAPS: ORAL_CAPSULE | ORAL | 0 refills | Status: DC

## 2020-04-29 MED ORDER — FLUCONAZOLE 150 MG PO TABS: ORAL_TABLET | ORAL | 0 refills | Status: DC

## 2020-05-04 ENCOUNTER — Inpatient Hospital Stay (HOSPITAL_COMMUNITY): Payer: BC Managed Care – PPO

## 2020-05-04 ENCOUNTER — Inpatient Hospital Stay (HOSPITAL_BASED_OUTPATIENT_CLINIC_OR_DEPARTMENT_OTHER): Payer: BC Managed Care – PPO | Admitting: Hematology

## 2020-05-04 ENCOUNTER — Encounter (HOSPITAL_COMMUNITY): Payer: Self-pay

## 2020-05-04 ENCOUNTER — Other Ambulatory Visit: Payer: Self-pay

## 2020-05-04 VITALS — BP 157/93 | HR 70 | Temp 97.0°F | Resp 18 | Wt 254.0 lb

## 2020-05-04 DIAGNOSIS — Z23 Encounter for immunization: Secondary | ICD-10-CM | POA: Diagnosis not present

## 2020-05-04 DIAGNOSIS — C9000 Multiple myeloma not having achieved remission: Secondary | ICD-10-CM

## 2020-05-04 DIAGNOSIS — Z5112 Encounter for antineoplastic immunotherapy: Secondary | ICD-10-CM | POA: Diagnosis not present

## 2020-05-04 LAB — CBC WITH DIFFERENTIAL/PLATELET
Abs Immature Granulocytes: 0.01 10*3/uL (ref 0.00–0.07)
Basophils Absolute: 0 10*3/uL (ref 0.0–0.1)
Basophils Relative: 1 %
Eosinophils Absolute: 0 10*3/uL (ref 0.0–0.5)
Eosinophils Relative: 1 %
HCT: 36.1 % (ref 36.0–46.0)
Hemoglobin: 11.6 g/dL — ABNORMAL LOW (ref 12.0–15.0)
Immature Granulocytes: 0 %
Lymphocytes Relative: 31 %
Lymphs Abs: 1.3 10*3/uL (ref 0.7–4.0)
MCH: 30.7 pg (ref 26.0–34.0)
MCHC: 32.1 g/dL (ref 30.0–36.0)
MCV: 95.5 fL (ref 80.0–100.0)
Monocytes Absolute: 0.4 10*3/uL (ref 0.1–1.0)
Monocytes Relative: 10 %
Neutro Abs: 2.5 10*3/uL (ref 1.7–7.7)
Neutrophils Relative %: 57 %
Platelets: 169 10*3/uL (ref 150–400)
RBC: 3.78 MIL/uL — ABNORMAL LOW (ref 3.87–5.11)
RDW: 14 % (ref 11.5–15.5)
WBC: 4.3 10*3/uL (ref 4.0–10.5)
nRBC: 0 % (ref 0.0–0.2)

## 2020-05-04 LAB — COMPREHENSIVE METABOLIC PANEL
ALT: 14 U/L (ref 0–44)
AST: 18 U/L (ref 15–41)
Albumin: 4.1 g/dL (ref 3.5–5.0)
Alkaline Phosphatase: 55 U/L (ref 38–126)
Anion gap: 9 (ref 5–15)
BUN: 27 mg/dL — ABNORMAL HIGH (ref 6–20)
CO2: 26 mmol/L (ref 22–32)
Calcium: 9.3 mg/dL (ref 8.9–10.3)
Chloride: 104 mmol/L (ref 98–111)
Creatinine, Ser: 1.06 mg/dL — ABNORMAL HIGH (ref 0.44–1.00)
GFR, Estimated: >60 mL/min (ref >60)
Glucose, Bld: 100 mg/dL — ABNORMAL HIGH (ref 70–99)
Potassium: 4.1 mmol/L (ref 3.5–5.1)
Sodium: 139 mmol/L (ref 135–145)
Total Bilirubin: 0.9 mg/dL (ref 0.3–1.2)
Total Protein: 7.4 g/dL (ref 6.5–8.1)

## 2020-05-04 LAB — MAGNESIUM: Magnesium: 1.8 mg/dL (ref 1.7–2.4)

## 2020-05-04 LAB — LACTATE DEHYDROGENASE: LDH: 140 U/L (ref 98–192)

## 2020-05-04 MED ORDER — INFLUENZA VAC SPLIT QUAD 0.5 ML IM SUSY
0.5000 mL | PREFILLED_SYRINGE | Freq: Once | INTRAMUSCULAR | Status: AC
  Administered 2020-05-04: 0.5 mL via INTRAMUSCULAR
  Filled 2020-05-04: qty 0.5

## 2020-05-04 MED ORDER — BORTEZOMIB CHEMO SQ INJECTION 3.5 MG (2.5MG/ML)
1.3000 mg/m2 | Freq: Once | INTRAMUSCULAR | Status: AC
  Administered 2020-05-04: 3 mg via SUBCUTANEOUS
  Filled 2020-05-04: qty 1.2

## 2020-05-04 MED ORDER — PROCHLORPERAZINE MALEATE 10 MG PO TABS
10.0000 mg | ORAL_TABLET | Freq: Once | ORAL | Status: AC
  Administered 2020-05-04: 10 mg via ORAL
  Filled 2020-05-04: qty 1

## 2020-05-04 NOTE — Progress Notes (Signed)
Labs reviewed with Dr Raliegh Ip, okay for treatment today.  Tolerated velcade injection well today without incidence.  Discharged ambulatory in stable condition.

## 2020-05-04 NOTE — Patient Instructions (Signed)
[place] at [facility] Discharge Instructions  You were seen today by Dr. Delton Coombes. He went over your recent results. You received your treatment and flu shot today; continue receiving it every 2 weeks. Continue getting your Zometa injection every 3 months. Dr. Delton Coombes will see you back in 10 weeks for labs and follow up.   Thank you for choosing [place] at [facility] to provide your oncology and hematology care.  To afford each patient quality time with our provider, please arrive at least 15 minutes before your scheduled appointment time.   If you have a lab appointment with the [facility] please come in thru the Main Entrance and check in at the main information desk  You need to re-schedule your appointment should you arrive 10 or more minutes late.  We strive to give you quality time with our providers, and arriving late affects you and other patients whose appointments are after yours.  Also, if you no show three or more times for appointments you may be dismissed from the clinic at the providers discretion.     Again, thank you for choosing [facility].  Our hope is that these requests will decrease the amount of time that you wait before being seen by our physicians.       _____________________________________________________________  Should you have questions after your visit to [facility], please contact our office at [phone] between the hours of 8:00 a.m. and 4:30 p.m.  Voicemails left after 4:00 p.m. will not be returned until the following business day.  For prescription refill requests, have your pharmacy contact our office and allow 72 hours.    Cancer Center Support Programs:   > Cancer Support Group  2nd Tuesday of the month 1pm-2pm, Journey Room

## 2020-05-04 NOTE — Progress Notes (Signed)
[name], [name] [address]   CLINIC:  Medical Oncology/Hematology  PCP:  [name], DO [address] / [place] [address] [phone]   REASON FOR VISIT:  Follow-up for multiple myeloma  PRIOR THERAPY:  1. RVD x 4 cycles from 11/01/2017 through 01/14/2018. 2. Stem cell transplant on 02/20/2018. 3. Velcade from 06/11/2018 to 12/22/2019.  NGS Results: Not done  CURRENT THERAPY: Bortezomib every 2 weeks  BRIEF ONCOLOGIC HISTORY:  Oncology History  Multiple myeloma ([place])  10/29/2017 Initial Diagnosis   Multiple myeloma ([place])   11/01/2017 - 01/24/2018 Chemotherapy   The patient had dexamethasone (DECADRON) 4 MG tablet, 1 of 1 cycle, Start date: --, End date: -- lenalidomide (REVLIMID) 25 MG capsule, 1 of 1 cycle, Start date: --, End date: -- bortezomib SQ (VELCADE) chemo injection 3 mg, 1.3 mg/m2 = 3 mg, Subcutaneous,  Once, 5 of 5 cycles Administration: 3 mg (11/01/2017), 3 mg (11/08/2017), 3 mg (11/05/2017), 3 mg (11/22/2017), 3 mg (11/12/2017), 3 mg (11/29/2017), 3 mg (11/26/2017), 3 mg (12/03/2017), 3 mg (12/13/2017), 3 mg (12/20/2017), 3 mg (12/17/2017), 3 mg (12/24/2017), 3 mg (01/03/2018), 3 mg (01/10/2018), 3 mg (01/07/2018), 3 mg (01/14/2018), 3 mg (01/24/2018)  for chemotherapy treatment.    06/11/2018 -  Chemotherapy   The patient had bortezomib SQ (VELCADE) chemo injection 3 mg, 1.3 mg/m2 = 3 mg, Subcutaneous,  Once, 45 of 46 cycles Administration: 3 mg (06/11/2018), 3 mg (07/29/2018), 3 mg (08/21/2018), 3 mg (09/04/2018), 3 mg (09/18/2018), 3 mg (10/02/2018), 3 mg (10/16/2018), 3 mg (10/29/2018), 3 mg (11/13/2018), 3 mg (06/18/2018), 3 mg (06/25/2018), 3 mg (07/01/2018), 3 mg (07/08/2018), 3 mg (07/15/2018), 3 mg (11/27/2018), 3 mg (12/11/2018), 3 mg (12/25/2018), 3 mg (01/08/2019), 3 mg (01/22/2019), 3 mg (02/05/2019), 3 mg (02/19/2019), 3 mg (03/05/2019), 3 mg (03/19/2019), 3 mg (04/02/2019), 3 mg (04/16/2019), 3 mg (04/30/2019), 3 mg (05/20/2019), 3 mg (06/03/2019), 3  mg (06/17/2019), 3 mg (07/01/2019), 3 mg (07/15/2019), 3 mg (07/29/2019), 3 mg (08/12/2019), 3 mg (08/26/2019), 3 mg (09/09/2019), 3 mg (10/12/2019), 3 mg (10/26/2019), 3 mg (11/09/2019), 3 mg (11/23/2019), 3 mg (12/08/2019), 3 mg (12/22/2019), 3 mg (03/09/2020), 3 mg (03/23/2020), 3 mg (04/06/2020), 3 mg (04/20/2020)  for chemotherapy treatment.      CANCER STAGING: Cancer Staging Multiple myeloma ([place]) Staging form: Plasma Cell Myeloma and Plasma Cell Disorders, AJCC 8th Edition - Clinical: No stage assigned - Unsigned - Clinical: No stage assigned - Unsigned   INTERVAL HISTORY:  [name], a 54 y.o. female, returns for routine follow-up and consideration for next cycle of chemotherapy. Joanna Reid was last seen on 03/09/2020.  Due for cycle #46 of bortezomib today.   Overall, she tells me she has been feeling pretty well. She is doing her speech therapy and is improving, though she notices that if she gets too excited, she gets tongue-tied. The numbness in her feet is stable on gabapentin. She continues having recurrent UTI's and is currently taking Macrobid since 10/20. She is tolerating Zometa well and denies having any jaw pain. She denies having any cough or leg swelling.  She is inquiring if she can get her flu shot today.  Overall, she feels ready for next cycle of chemo today.    REVIEW OF SYSTEMS:  Review of Systems  Constitutional: Positive for fatigue (75%). Negative for appetite change.  Respiratory: Negative for cough.   Cardiovascular: Negative for leg swelling.  Genitourinary: Positive for difficulty urinating (recurrent UTI).  Neurological: Positive for numbness (feet).  All other systems reviewed and are negative.   PAST MEDICAL/SURGICAL HISTORY:  Past Medical History:  Diagnosis Date  . Acid reflux   . Allergic rhinitis   . Cancer ([facility])    multiple myeloma  . Diabetes mellitus    type 2  . Gout   . Gout   . HBP (high blood pressure)   . History of kidney stones   .  Migraines    Past Surgical History:  Procedure Laterality Date  . BREAST CYST EXCISION Left    2009 no visible scar on skin  . CESAREAN SECTION    . COLONOSCOPY WITH PROPOFOL N/A 12/25/2019   Procedure: COLONOSCOPY WITH PROPOFOL;  Surgeon: Rogene Houston, MD;  Location: [facility];  Service: Endoscopy;  Laterality: N/A;  730  . EXTRACORPOREAL SHOCK WAVE LITHOTRIPSY Left 10/10/2017   Procedure: LEFT EXTRACORPOREAL SHOCK WAVE LITHOTRIPSY (ESWL);  Surgeon: Bjorn Loser, MD;  Location: [facility];  Service: Urology;  Laterality: Left;  . EYE SURGERY    . HEMORRHOID SURGERY N/A 11/19/2012   Procedure: HEMORRHOIDECTOMY;  Surgeon: Jamesetta So, MD;  Location: [facility];  Service: General;  Laterality: N/A;  . kidney stones  1998  . LAPAROSCOPIC UNILATERAL SALPINGO OOPHERECTOMY  05/14/2012   Procedure: LAPAROSCOPIC UNILATERAL SALPINGO OOPHORECTOMY;  Surgeon: [name], MD;  Location: [facility];  Service: Gynecology;  Laterality: Right;  laparoscopic right salpingo-oophorectomy  . PARTIAL HYSTERECTOMY    . POLYPECTOMY  12/25/2019   Procedure: POLYPECTOMY;  Surgeon: Rogene Houston, MD;  Location: [facility];  Service: Endoscopy;;  . TONSILECTOMY, ADENOIDECTOMY, BILATERAL MYRINGOTOMY AND TUBES    . VESICOVAGINAL FISTULA CLOSURE W/ TAH      SOCIAL HISTORY:  Social History   Socioeconomic History  . Marital status: Married    Spouse name: Not on file  . Number of children: Not on file  . Years of education: 65  . Highest education level: Not on file  Occupational History    Employer: [employer]  Tobacco Use  . Smoking status: Former Smoker    Quit date: 02/27/1999    Years since quitting: 21.1  . Smokeless tobacco: Never Used  . Tobacco comment: socially   Vaping Use  . Vaping Use: Never used  Substance and Sexual Activity  . Alcohol use: No    Alcohol/week: 0.0 standard drinks  . Drug use: No  . Sexual activity: Yes  Other Topics Concern  . Not on file  Social History Narrative    . Not on file   Social Determinants of Health   Financial Resource Strain: Low Risk   . Difficulty of Paying Living Expenses: Not hard at all  Food Insecurity: No Food Insecurity  . Worried About Charity fundraiser in the Last Year: Never true  . Ran Out of Food in the Last Year: Never true  Transportation Needs: No Transportation Needs  . Lack of Transportation (Medical): No  . Lack of Transportation (Non-Medical): No  Physical Activity: Insufficiently Active  . Days of Exercise per Week: 7 days  . Minutes of Exercise per Session: 10 min  Stress:   . Feeling of Stress : Not on file  Social Connections: Socially Integrated  . Frequency of Communication with Friends and Family: More than three times a week  . Frequency of Social Gatherings with Friends and Family: More than three times a week  . Attends Religious Services: More than 4 times per year  .  Active Member of Clubs or Organizations: Yes  . Attends Archivist Meetings: More than 4 times per year  . Marital Status: Married  Human resources officer Violence: Not At Risk  . Fear of Current or Ex-Partner: No  . Emotionally Abused: No  . Physically Abused: No  . Sexually Abused: No    FAMILY HISTORY:  Family History  Problem Relation Age of Onset  . Arthritis Other   . Cancer Other   . Diabetes Other   . Hypertension Mother   . Dementia Mother   . Diabetes Father   . ALS Father   . Diabetes Brother   . Hypertension Brother   . Cancer Paternal Aunt   . COPD Maternal Grandmother   . Cancer Maternal Grandfather   . Anesthesia problems Paternal Grandfather     CURRENT MEDICATIONS:  Current Outpatient Medications  Medication Sig Dispense Refill  . acyclovir (ZOVIRAX) 400 MG tablet TAKE (1) TABLET BY MOUTH TWICE DAILY. 60 tablet 6  . albuterol (PROVENTIL HFA;VENTOLIN HFA) 108 (90 Base) MCG/ACT inhaler Inhale 2 puffs into the lungs every 4 (four) hours as needed for wheezing or shortness of breath. 1 Inhaler 3   . allopurinol (ZYLOPRIM) 300 MG tablet TAKE 1 TABLET DAILY (Patient taking differently: Take 300 mg by mouth daily. ) 90 tablet 1  . amLODipine (NORVASC) 5 MG tablet Take 5 mg by mouth daily.    Marland Kitchen aspirin EC 81 MG tablet Take 1 tablet (81 mg total) by mouth daily. 30 tablet 11  . bumetanide (BUMEX) 0.5 MG tablet Take 0.5 mg by mouth daily.     . carvedilol (COREG) 25 MG tablet Take 1 tablet (25 mg total) by mouth 2 (two) times daily.    . ciprofloxacin (CIPRO) 250 MG tablet Take 1 tablet (250 mg total) by mouth 2 (two) times daily. 6 tablet 0  . diazepam (VALIUM) 5 MG tablet Take 1 tablet (5 mg total) by mouth at bedtime. 30 tablet 2  . Docosanol (ABREVA) 10 % CREA Apply 1 application topically daily as needed (for nose).     Marland Kitchen docusate sodium (COLACE) 100 MG capsule Take 100 mg by mouth daily.     . fluconazole (DIFLUCAN) 150 MG tablet     . fluconazole (DIFLUCAN) 150 MG tablet Take one tablet po now and then one 3 days later. 2 tablet 0  . fluticasone (FLONASE) 50 MCG/ACT nasal spray Place 2 sprays into both nostrils daily. 16 g 0  . losartan (COZAAR) 100 MG tablet Take 100 mg by mouth daily.    . Magnesium 200 MG TABS Take 600 mg by mouth daily.     . magnesium oxide (MAG-OX) 400 (241.3 Mg) MG tablet Take 1 tablet (400 mg total) by mouth 3 (three) times daily. 90 tablet 3  . metFORMIN (GLUCOPHAGE) 500 MG tablet TAKE 1 TABLET BY MOUTH ONCE DAILY WITH BREAKFAST (Patient taking differently: Take 1,000 mg by mouth 2 (two) times daily with a meal. ) 90 tablet 0  . nitrofurantoin, macrocrystal-monohydrate, (MACROBID) 100 MG capsule Take one capsule po BID for 7 days 14 capsule 0  . pantoprazole (PROTONIX) 40 MG tablet Take 1 tablet (40 mg total) by mouth daily. 90 tablet 1  . pregabalin (LYRICA) 200 MG capsule TAKE (1) CAPSULE BY MOUTH TWICE DAILY. 60 capsule 6  . Propylene Glycol (SYSTANE BALANCE) 0.6 % SOLN Apply 1 drop to eye daily as needed (dry eye).     . rosuvastatin (CRESTOR) 10 MG tablet  Take 1 tablet (10 mg total) by mouth daily. 30 tablet 0  . TRULICITY 1.5 HY/8.5OY SOPN Inject 0.75 mg into the skin every Monday.     . valACYclovir (VALTREX) 1000 MG tablet TAKE 2 TABLETS NOW, THEN 2 TABLETS 12 HOURS LATER. 12 tablet 0  . prochlorperazine (COMPAZINE) 10 MG tablet every 14 (fourteen) days (Patient not taking: Reported on 05/04/2020)     Current Facility-Administered Medications  Medication Dose Route Frequency Provider Last Rate Last Admin  . influenza vac split quadrivalent PF (FLUARIX) injection 0.5 mL  0.5 mL Intramuscular Once Derek Jack, MD        ALLERGIES:  No Known Allergies  PHYSICAL EXAM:  Performance status (ECOG): 1 - Symptomatic but completely ambulatory  Vitals:   05/04/20 1329  BP: (!) 157/93  Pulse: 70  Resp: 18  Temp: (!) 97 F (36.1 C)  SpO2: 99%   Wt Readings from Last 3 Encounters:  05/04/20 254 lb (115.2 kg)  04/25/20 253 lb 6.4 oz (114.9 kg)  04/20/20 251 lb 3.2 oz (113.9 kg)   Physical Exam Vitals reviewed.  Constitutional:      Appearance: Normal appearance. She is obese.  Cardiovascular:     Rate and Rhythm: Normal rate and regular rhythm.     Pulses: Normal pulses.     Heart sounds: Normal heart sounds.  Pulmonary:     Effort: Pulmonary effort is normal.     Breath sounds: Normal breath sounds.  Musculoskeletal:     Right lower leg: No edema.     Left lower leg: No edema.  Neurological:     General: No focal deficit present.     Mental Status: She is alert and oriented to person, place, and time.  Psychiatric:        Mood and Affect: Mood normal.        Behavior: Behavior normal.     LABORATORY DATA:  I have reviewed the labs as listed.  CBC Latest Ref Rng & Units 05/04/2020 04/20/2020 04/06/2020  WBC 4.0 - 10.5 K/uL 4.3 3.8(L) 4.7  Hemoglobin 12.0 - 15.0 g/dL 11.6(L) 11.8(L) 11.1(L)  Hematocrit 36 - 46 % 36.1 36.0 34.0(L)  Platelets 150 - 400 K/uL 169 182 170   CMP Latest Ref Rng & Units 04/20/2020  04/06/2020 03/23/2020  Glucose 70 - 99 mg/dL 112(H) 121(H) 115(H)  BUN 6 - 20 mg/dL 30(H) 27(H) 27(H)  Creatinine 0.44 - 1.00 mg/dL 1.18(H) 1.35(H) 1.21(H)  Sodium 135 - 145 mmol/L 139 141 140  Potassium 3.5 - 5.1 mmol/L 4.0 3.8 3.9  Chloride 98 - 111 mmol/L 102 108 104  CO2 22 - 32 mmol/L 26 25 26   Calcium 8.9 - 10.3 mg/dL 9.7 9.1 9.6  Total Protein 6.5 - 8.1 g/dL 7.2 6.7 7.2  Total Bilirubin 0.3 - 1.2 mg/dL 0.9 0.7 0.8  Alkaline Phos 38 - 126 U/L 60 62 61  AST 15 - 41 U/L 18 19 20   ALT 0 - 44 U/L 13 14 14    Lab Results  Component Value Date   LDH 134 04/20/2020   LDH 133 04/06/2020   LDH 131 03/23/2020    DIAGNOSTIC IMAGING:  I have independently reviewed the scans and discussed with the patient. No results found.   ASSESSMENT:  1. IgG lambda multiple myeloma, stage III, del 17 p: -4 cycles of RVD from 11/01/2017 through 01/14/2018. -St[facility]/20/2018 [facility]. -Maintenance Velcade every 2 weeks and Revlimid 10 mg 3 weeks on/1 week  off started on 07/29/2018. -Myeloma panel from 12/08/2018 shows M spike not observed. Kappa light chains of 32.1 with ratio of 1.64. Immunofixation was negative. -She hadCVAon 12/26/2019 with aphasia. MRI of the brain on 12/27/2019 shows acute ischemic nonhemorrhagic left MCA territory infarct involving left parietal lobe, corresponding with perfusion deficit on CT scan angiogram. No associated hemorrhage or mass-effect. Additional few scattered punctate acute ischemic nonhemorrhagic infarcts involving bilateral frontal and parietal lobes as well as left cerebellum. -Last Velcade treatment was on 12/22/2019. We have held Revlimid since then.  2. CVA with aphasia: -We held her myeloma treatments since CVA with aphasia on 12/26/2019. -Her aphasia is improving.   PLAN:  1. IgG lambda multiple myeloma, stage III, del 17 p: -Reviewed myeloma labs from 04/20/2020 which showed normal SPEP and immunofixation.  Free light chains ratio is  1.98.  Kappa light chains are elevated at 31.5 and lambda light chains are normal. -Reviewed CBC and LFTs from today which were grossly within normal limits.  Creatinine is 1.06. -We will continue with Velcade maintenance at this time.  We will continue to hold Revlimid because of her recent stroke. -RTC 10 weeks for follow-up with repeat myeloma labs.  2. Hypomagnesemia: -Continue magnesium supplements at home.  Magnesium is 1.8.  3. Bone strengthening agents: -Continue Zometa every 3 months.  4. Peripheral neuropathy: -Continue Lyrica 200 mg twice daily.  5. ID prophylaxis: -Continue acyclovir twice daily.  6. Diabetes: -Continue Metformin and Trulicity.   Orders placed this encounter:  No orders of the defined types were placed in this encounter.    Derek Jack, MD [place] [phone]   I, Milinda Antis, am acting as a scribe for Dr. Sanda Linger.  I, Derek Jack MD, have reviewed the above documentation for accuracy and completeness, and I agree with the above.

## 2020-05-04 NOTE — Patient Instructions (Signed)
[facility] Discharge Instructions for Patients Receiving Chemotherapy  Today you received the following chemotherapy agents   To help prevent nausea and vomiting after your treatment, we encourage you to take your nausea medication   If you develop nausea and vomiting that is not controlled by your nausea medication, call the clinic.   BELOW ARE SYMPTOMS THAT SHOULD BE REPORTED IMMEDIATELY:  *FEVER GREATER THAN 100.5 F  *CHILLS WITH OR WITHOUT FEVER  NAUSEA AND VOMITING THAT IS NOT CONTROLLED WITH YOUR NAUSEA MEDICATION  *UNUSUAL SHORTNESS OF BREATH  *UNUSUAL BRUISING OR BLEEDING  TENDERNESS IN MOUTH AND THROAT WITH OR WITHOUT PRESENCE OF ULCERS  *URINARY PROBLEMS  *BOWEL PROBLEMS  UNUSUAL RASH Items with * indicate a potential emergency and should be followed up as soon as possible.  Feel free to call the clinic should you have any questions or concerns. The clinic phone number is [phone].  Please show the CHEMO ALERT CARD at check-in to the Emergency Department and triage nurse.   

## 2020-05-08 ENCOUNTER — Encounter: Payer: Self-pay | Admitting: Family Medicine

## 2020-05-09 ENCOUNTER — Ambulatory Visit: Payer: BC Managed Care – PPO | Admitting: Adult Health

## 2020-05-12 ENCOUNTER — Other Ambulatory Visit: Payer: Self-pay

## 2020-05-12 ENCOUNTER — Ambulatory Visit (INDEPENDENT_AMBULATORY_CARE_PROVIDER_SITE_OTHER): Payer: Medicare Other | Admitting: Adult Health

## 2020-05-12 ENCOUNTER — Encounter: Payer: Self-pay | Admitting: Adult Health

## 2020-05-12 VITALS — BP 140/84 | HR 68 | Ht 64.0 in | Wt 258.0 lb

## 2020-05-12 DIAGNOSIS — E785 Hyperlipidemia, unspecified: Secondary | ICD-10-CM

## 2020-05-12 DIAGNOSIS — E119 Type 2 diabetes mellitus without complications: Secondary | ICD-10-CM

## 2020-05-12 DIAGNOSIS — I63412 Cerebral infarction due to embolism of left middle cerebral artery: Secondary | ICD-10-CM

## 2020-05-12 DIAGNOSIS — I10 Essential (primary) hypertension: Secondary | ICD-10-CM

## 2020-05-12 DIAGNOSIS — I69320 Aphasia following cerebral infarction: Secondary | ICD-10-CM

## 2020-05-12 NOTE — Progress Notes (Signed)
[facility] [address] [place]. [place] [address] [phone]       STROKE FOLLOW UP NOTE  [name] Date of Birth:  May 23, 1966 Medical Record Number:  [id number]   Reason for Referral: stroke follow up    SUBJECTIVE:   CHIEF COMPLAINT:  Chief Complaint  Patient presents with  . Follow-up    rm 9  . Cerebrovascular Accident    Pt is having no new sx    HPI:   Today, 05/12/2020, Joanna Reid returns for stroke follow-up.  She has been doing well since prior visit with ongoing improvement of her speech.  She does notice worsening more so when she is excited or fatigued.  Continues to do exercises routinely at home.  Denies new or worsening stroke/TIA symptoms.  She remains on aspirin 81 mg daily without bleeding or bruising.  Remains on Crestor 10 mg daily without myalgias.  Blood pressure today 140/84.  Continues to follow with endocrinology for DM management.  She continues to follow closely with oncology for multiple myeloma continuing Velcade maintenance with Revlimid on hold since her stroke. Per patient, current myeloma counts satisfactory therefore not planning on restarting Revlimid unless indicated in the future.  She continues to follow with urology for recurrent UTIs.  No further concerns at this time.   History provided for reference purposes only Initial visit 01/26/2020 JM: Joanna Reid is being seen for hospital follow-up accompanied by her husband.  Residual deficits of expressive aphasia and reading difficulty with ongoing improvement working with outpatient SLP.  Currently wearing cardiac monitor ordered by [facility] cardiology and will be completed at the end of the week.  Apparently was discharged on aspirin 81 mg daily and clopidogrel 75 mg daily and denies bleeding or bruising.  Continues on Crestor 10 mg daily without myalgias.  Blood pressure today 118/72.  Recent change to antihypertensive regimen by cardiology due to continued  elevated blood pressures.  Routinely monitors at home.  Continues to follow with endocrinology for diabetic management.  Chemotherapy remains on hold and questioning duration of restarting.  Plans on repeat lab work next week.  No further concerns at this time.  Stroke admission 12/25/2019 Joanna Reid is a 54 y.o. female with history of multiple myeloma (s/p stem cell transplant and currently on maintenance Velcade and Revlimid), Covid positive in March, morbid obesity, DM, HTN, migraines and smoking s/p screening colonoscopy on 12/25/2019. She developed confusion and expressive aphasia on Saturday (12/26/2019).  Eventually transferred to [facility] for further work-up.  Stroke work-up revealed embolic left MCA parietal branch stroke as well scattered punctate bilateral frontal and parietal and left cerebellar infarcts.  CTA no significant large vessel stenosis or occlusion.  Currently on chemotherapy with thrombocytopenia therefore dual platelet not recommended and recommended continuation of aspirin 81 mg.  HTN stable with long-term BP goal normotensive range.  LDL 33 and increased Crestor to 52m daily.  History of DM with A1c 5.7.  Other stroke risk factors include former tobacco use, obesity and migraines.  Other active problems include leukopenia, anemia, thrombocytopenia, hypokalemia, UTI and Covid positive 09/2019.  Advised to ensure follow-up with oncology with chemotherapy possibly placed on hold for a few months during recovery.  No further evaluation including assessment for dysrhythmias and need for anticoagulation due to thrombocytopenia recommended considering in the future with possible 30-day cardiac event monitor.  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   Resultant mild word finding difficulties/mild aphasia.  Code Stroke CT  Head - Normal CT appearance of the brain. ASPECTS is 10/10.     CT head - not ordered  MRI head -small left parietal MCA branch infarct.  Tiny scattered  punctate bilateral frontal and parietal and left cerebellar infarcts   MRA head - not ordered  CTA H&N -  No emergent large vessel occlusion. Mild narrowing of the mid basilar artery without other significant proximal stenosis, aneurysm, or branch vessel occlusion. Moderate distal small vessel disease in both the anterior and posterior circulation. Moderate tortuosity of the cervical internal carotid arteries bilaterally, likely secondary to hypertension. Atherosclerotic changes at the left carotid bifurcation and cavernous internal carotid arteries bilaterally without significant stenosis.  CT Perfusion - 24 mL left parietal lobe watershed region area of ischemia without defined core infarct.   Carotid Doppler - CTA neck performed - carotid dopplers not indicated.  2D Echo - EF 60 - 65%. No cardiac source of emboli identified.   Joanna Reid - negative  LDL - 107  HgbA1c - 5.7  UDS - benzodiazepine  VTE prophylaxis - [place] Heparin  aspirin 81 mg daily prior to admission, now on aspirin 81 mg daily    Patient counseled to be compliant with her antithrombotic medications  Ongoing aggressive stroke risk factor management  Therapy recommendations: OP SLP  Disposition: home       ROS:   14 system review of systems performed and negative with exception of speech difficulty, recurrent UTIs  PMH:  Past Medical History:  Diagnosis Date  . Acid reflux   . Allergic rhinitis   . Cancer ([place])    multiple myeloma  . Diabetes mellitus    type Reid  . Gout   . Gout   . HBP (high blood pressure)   . History of kidney stones   . Migraines     PSH:  Past Surgical History:  Procedure Laterality Date  . BREAST CYST EXCISION Left    2009 no visible scar on skin  . CESAREAN SECTION    . COLONOSCOPY WITH PROPOFOL N/A 12/25/2019   Procedure: COLONOSCOPY WITH PROPOFOL;  Surgeon: Rogene Houston, MD;  Location: [facility];  Service: Endoscopy;  Laterality: N/A;  730  .  EXTRACORPOREAL SHOCK WAVE LITHOTRIPSY Left 10/10/2017   Procedure: LEFT EXTRACORPOREAL SHOCK WAVE LITHOTRIPSY (ESWL);  Surgeon: Bjorn Loser, MD;  Location: [facility] ORS;  Service: Urology;  Laterality: Left;  . EYE SURGERY    . HEMORRHOID SURGERY N/A 11/19/2012   Procedure: HEMORRHOIDECTOMY;  Surgeon: Jamesetta So, MD;  Location: [facility] ORS;  Service: General;  Laterality: N/A;  . kidney stones  1998  . LAPAROSCOPIC UNILATERAL SALPINGO OOPHERECTOMY  05/14/2012   Procedure: LAPAROSCOPIC UNILATERAL SALPINGO OOPHORECTOMY;  Surgeon: [name], MD;  Location: [facility] ORS;  Service: Gynecology;  Laterality: Right;  laparoscopic right salpingo-oophorectomy  . PARTIAL HYSTERECTOMY    . POLYPECTOMY  12/25/2019   Procedure: POLYPECTOMY;  Surgeon: Rogene Houston, MD;  Location: [facility];  Service: Endoscopy;;  . TONSILECTOMY, ADENOIDECTOMY, BILATERAL MYRINGOTOMY AND TUBES    . VESICOVAGINAL FISTULA CLOSURE W/ TAH      Social History:  Social History   Socioeconomic History  . Marital status: Married    Spouse name: Not on file  . Number of children: Not on file  . Years of education: 61  . Highest education level: Not on file  Occupational History    Employer: [employer]  Tobacco Use  . Smoking status: Former Smoker  Quit date: 02/27/1999    Years since quitting: 21.Reid  . Smokeless tobacco: Never Used  . Tobacco comment: socially   Vaping Use  . Vaping Use: Never used  Substance and Sexual Activity  . Alcohol use: No    Alcohol/week: 0.0 standard drinks  . Drug use: No  . Sexual activity: Yes  Other Topics Concern  . Not on file  Social History Narrative  . Not on file   Social Determinants of Health   Financial Resource Strain: Low Risk   . Difficulty of Paying Living Expenses: Not hard at all  Food Insecurity: No Food Insecurity  . Worried About Charity fundraiser in the Last Year: Never true  . Ran Out of Food in the Last Year: Never true  Transportation Needs: No Transportation  Needs  . Lack of Transportation (Medical): No  . Lack of Transportation (Non-Medical): No  Physical Activity: Insufficiently Active  . Days of Exercise per Week: 7 days  . Minutes of Exercise per Session: 10 min  Stress:   . Feeling of Stress : Not on file  Social Connections: Socially Integrated  . Frequency of Communication with Friends and Family: More than three times a week  . Frequency of Social Gatherings with Friends and Family: More than three times a week  . Attends Religious Services: More than 4 times per year  . Active Member of Clubs or Organizations: Yes  . Attends Archivist Meetings: More than 4 times per year  . Marital Status: Married  Human resources officer Violence: Not At Risk  . Fear of Current or Ex-Partner: No  . Emotionally Abused: No  . Physically Abused: No  . Sexually Abused: No    Family History:  Family History  Problem Relation Age of Onset  . Arthritis Other   . Cancer Other   . Diabetes Other   . Hypertension Mother   . Dementia Mother   . Diabetes Father   . ALS Father   . Diabetes Brother   . Hypertension Brother   . Cancer Paternal Aunt   . COPD Maternal Grandmother   . Cancer Maternal Grandfather   . Anesthesia problems Paternal Grandfather     Medications:   Current Outpatient Medications on File Prior to Visit  Medication Sig Dispense Refill  . acyclovir (ZOVIRAX) 400 MG tablet TAKE (1) TABLET BY MOUTH TWICE DAILY. 60 tablet 6  . albuterol (PROVENTIL HFA;VENTOLIN HFA) 108 (90 Base) MCG/ACT inhaler Inhale Reid puffs into the lungs every 4 (four) hours as needed for wheezing or shortness of breath. 1 Inhaler 3  . allopurinol (ZYLOPRIM) 300 MG tablet TAKE 1 TABLET DAILY (Patient taking differently: Take 300 mg by mouth daily. ) 90 tablet 1  . amLODipine (NORVASC) 5 MG tablet Take 5 mg by mouth daily.    Marland Kitchen aspirin EC 81 MG tablet Take 1 tablet (81 mg total) by mouth daily. 30 tablet 11  . bumetanide (BUMEX) 0.5 MG tablet Take 0.5 mg  by mouth daily.     . carvedilol (COREG) 25 MG tablet Take 1 tablet (25 mg total) by mouth Reid (two) times daily.    . diazepam (VALIUM) 5 MG tablet Take 1 tablet (5 mg total) by mouth at bedtime. 30 tablet Reid  . Docosanol (ABREVA) 10 % CREA Apply 1 application topically daily as needed (for nose).     Marland Kitchen docusate sodium (COLACE) 100 MG capsule Take 100 mg by mouth daily.     . fluticasone (  FLONASE) 50 MCG/ACT nasal spray Place Reid sprays into both nostrils daily. 16 g 0  . losartan (COZAAR) 100 MG tablet Take 100 mg by mouth daily.    . magnesium oxide (MAG-OX) 400 (241.3 Mg) MG tablet Take 1 tablet (400 mg total) by mouth 3 (three) times daily. 90 tablet 3  . metFORMIN (GLUCOPHAGE) 500 MG tablet TAKE 1 TABLET BY MOUTH ONCE DAILY WITH BREAKFAST (Patient taking differently: Take 1,000 mg by mouth Reid (two) times daily with a meal. ) 90 tablet 0  . nitrofurantoin, macrocrystal-monohydrate, (MACROBID) 100 MG capsule Take one capsule po BID for 7 days 14 capsule 0  . pantoprazole (PROTONIX) 40 MG tablet Take 1 tablet (40 mg total) by mouth daily. 90 tablet 1  . pregabalin (LYRICA) 200 MG capsule TAKE (1) CAPSULE BY MOUTH TWICE DAILY. 60 capsule 6  . prochlorperazine (COMPAZINE) 10 MG tablet every 14 (fourteen) days (Patient not taking: Reported on 05/04/2020)    . Propylene Glycol (SYSTANE BALANCE) 0.6 % SOLN Apply 1 drop to eye daily as needed (dry eye).     . rosuvastatin (CRESTOR) 10 MG tablet Take 1 tablet (10 mg total) by mouth daily. 30 tablet 0  . TRULICITY 1.5 XI/5.0TU SOPN Inject 0.75 mg into the skin every Monday.     . valACYclovir (VALTREX) 1000 MG tablet TAKE Reid TABLETS NOW, THEN Reid TABLETS 12 HOURS LATER. 12 tablet 0   No current facility-administered medications on file prior to visit.    Allergies:  No Known Allergies    OBJECTIVE:  Physical Exam  Vitals:   05/12/20 0734  BP: 140/84  Pulse: 68  Weight: 258 lb (117 kg)  Height: _0  (1.626 m)   Body mass index is 44.29  kg/m. No exam data present   General: well developed, well nourished, very pleasant middle-age Caucasian female, seated, in no evident distress Head: head normocephalic and atraumatic.   Neck: supple with no carotid or supraclavicular bruits Cardiovascular: regular rate and rhythm, no murmurs Musculoskeletal: no deformity Skin:  no rash/petichiae Vascular:  Normal pulses all extremities   Neurologic Exam Mental Status: Awake and fully alert.  Mild expressive aphasia with speech hesitancy.  Oriented to place and time. Recent and remote memory intact. Attention span, concentration and fund of knowledge appropriate. Mood and affect appropriate.  Cranial Nerves: Pupils equal, briskly reactive to light. Extraocular movements full without nystagmus. Visual fields full to confrontation. Hearing intact. Facial sensation intact. Face, tongue, palate moves normally and symmetrically.  Motor: Normal bulk and tone. Normal strength in all tested extremity muscles. Sensory.: intact to touch , pinprick , position and vibratory sensation.  Coordination: Rapid alternating movements normal in all extremities. Finger-to-nose and heel-to-shin performed accurately bilaterally. Gait and Station: Arises from chair without difficulty. Stance is normal. Gait demonstrates normal stride length and balance Reflexes: 1+ and symmetric. Toes downgoing.       ASSESSMENT/PLAN: ARICKA GOLDBERGER is a 54 y.o. year old female presented with confusion and expressive aphasia on 12/28/2019 after screening colonoscopy and holding of aspirin with stroke work-up revealing embolic left MCA parietal branch infarct secondary to unknown source.  Currently receiving chemotherapy for multiple myeloma and had thrombocytopenia during admission.  Vascular risk factors include multiple myeloma on chemotherapy, thrombocytopenia, HTN, HLD, DM and migraines.      1. Left MCA stroke:  a. Residual deficits: Mild expressive aphasia with  improvement since prior visit.  Encouraged continued exercises at home for hopeful ongoing improvement b. Continue aspirin 81  mg daily  and Crestor 10 mg daily for secondary stroke prevention.  c. 30-day cardiac event monitor negative for atrial fibrillation d. Discussed secondary stroke prevention measures and importance of close PCP follow-up for aggressive stroke risk factor management Reid. HTN: BP goal<130/90.  Controlled on losartan carvedilol and amlodipine per PCP 3. HLD: LDL<70.  On Crestor 10 mg daily per PCP.  Advised obtaining repeat lipid panel at next follow-up with PCP or endocrinology in the near future 4. DMII: A1c<7.  Recent A1c 5.7. On Trulicity and Metformin per endocrinology 5. Multiple myeloma: On maintenance Velcade per oncology.  Revlimid remains on hold since her stroke 12/2019 and continues to be on hold as myeloma labs have been stable per pt. advised to continue routine follow-up with oncology   Follow up in 6 months or call earlier if needed   CC:  [facility] provider: Dr. Harle Stanford, Malena M, DO    I spent 30 minutes of face-to-face and non-face-to-face time with patient.  This included previsit chart review, lab review, study review, order entry, electronic health record documentation, patient education regarding prior stroke, residual deficits, importance of managing stroke risk factors and answered all questions to patient satisfaction   Joanna Reid, [facility]  [facility] [address] [place], [place] [address]  Phone [phone] Fax [phone] Note: This document was prepared with digital dictation and possible smart phrase technology. Any transcriptional errors that result from this process are unintentional.

## 2020-05-12 NOTE — Progress Notes (Signed)
I agree with the above plan 

## 2020-05-12 NOTE — Patient Instructions (Signed)
Continue aspirin 81 mg daily  and Crestor for secondary stroke prevention  Continue to follow with oncology as scheduled  Continue to follow up with PCP regarding cholesterol and blood pressure management  Continue to follow with endocrinology for diabetic management Maintain strict control of hypertension with blood pressure goal below 130/90, diabetes with hemoglobin A1c goal below 7.0 % and cholesterol with LDL cholesterol (bad cholesterol) goal below 70 mg/dL.     Followup in the future with me in 6 months or call earlier if needed     Thank you for coming to see [name] at [facility]. I hope we have been able to provide you high quality care today.  You may receive a patient satisfaction survey over the next few weeks. We would appreciate your feedback and comments so that we may continue to improve ourselves and the health of our patients.

## 2020-05-18 ENCOUNTER — Inpatient Hospital Stay (HOSPITAL_COMMUNITY): Payer: BC Managed Care – PPO | Attending: Hematology

## 2020-05-18 ENCOUNTER — Encounter (HOSPITAL_COMMUNITY): Payer: Self-pay

## 2020-05-18 ENCOUNTER — Other Ambulatory Visit: Payer: Self-pay

## 2020-05-18 ENCOUNTER — Inpatient Hospital Stay (HOSPITAL_COMMUNITY): Payer: BC Managed Care – PPO

## 2020-05-18 VITALS — BP 144/86 | HR 79 | Temp 97.0°F | Resp 18 | Wt 251.8 lb

## 2020-05-18 DIAGNOSIS — C9000 Multiple myeloma not having achieved remission: Secondary | ICD-10-CM | POA: Insufficient documentation

## 2020-05-18 DIAGNOSIS — Z5112 Encounter for antineoplastic immunotherapy: Secondary | ICD-10-CM | POA: Insufficient documentation

## 2020-05-18 DIAGNOSIS — C9001 Multiple myeloma in remission: Secondary | ICD-10-CM

## 2020-05-18 LAB — CBC WITH DIFFERENTIAL/PLATELET
Abs Immature Granulocytes: 0.02 10*3/uL (ref 0.00–0.07)
Basophils Absolute: 0 10*3/uL (ref 0.0–0.1)
Basophils Relative: 0 %
Eosinophils Absolute: 0 10*3/uL (ref 0.0–0.5)
Eosinophils Relative: 0 %
HCT: 36.2 % (ref 36.0–46.0)
Hemoglobin: 11.5 g/dL — ABNORMAL LOW (ref 12.0–15.0)
Immature Granulocytes: 0 %
Lymphocytes Relative: 30 %
Lymphs Abs: 1.5 10*3/uL (ref 0.7–4.0)
MCH: 30.5 pg (ref 26.0–34.0)
MCHC: 31.8 g/dL (ref 30.0–36.0)
MCV: 96 fL (ref 80.0–100.0)
Monocytes Absolute: 0.6 10*3/uL (ref 0.1–1.0)
Monocytes Relative: 13 %
Neutro Abs: 2.7 10*3/uL (ref 1.7–7.7)
Neutrophils Relative %: 57 %
Platelets: 166 10*3/uL (ref 150–400)
RBC: 3.77 MIL/uL — ABNORMAL LOW (ref 3.87–5.11)
RDW: 14.5 % (ref 11.5–15.5)
WBC: 4.8 10*3/uL (ref 4.0–10.5)
nRBC: 0 % (ref 0.0–0.2)

## 2020-05-18 LAB — COMPREHENSIVE METABOLIC PANEL
ALT: 18 U/L (ref 0–44)
AST: 17 U/L (ref 15–41)
Albumin: 4 g/dL (ref 3.5–5.0)
Alkaline Phosphatase: 48 U/L (ref 38–126)
Anion gap: 8 (ref 5–15)
BUN: 30 mg/dL — ABNORMAL HIGH (ref 6–20)
CO2: 25 mmol/L (ref 22–32)
Calcium: 9.4 mg/dL (ref 8.9–10.3)
Chloride: 106 mmol/L (ref 98–111)
Creatinine, Ser: 1.02 mg/dL — ABNORMAL HIGH (ref 0.44–1.00)
GFR, Estimated: >60 mL/min (ref >60)
Glucose, Bld: 96 mg/dL (ref 70–99)
Potassium: 3.8 mmol/L (ref 3.5–5.1)
Sodium: 139 mmol/L (ref 135–145)
Total Bilirubin: 0.6 mg/dL (ref 0.3–1.2)
Total Protein: 6.6 g/dL (ref 6.5–8.1)

## 2020-05-18 MED ORDER — BORTEZOMIB CHEMO SQ INJECTION 3.5 MG (2.5MG/ML)
1.3000 mg/m2 | Freq: Once | INTRAMUSCULAR | Status: AC
  Administered 2020-05-18: 3 mg via SUBCUTANEOUS
  Filled 2020-05-18: qty 1.2

## 2020-05-18 MED ORDER — PROCHLORPERAZINE MALEATE 10 MG PO TABS: 10.0000 mg | ORAL_TABLET | Freq: Once | ORAL | Status: DC

## 2020-05-18 NOTE — Patient Instructions (Signed)
[facility] Discharge Instructions for Patients Receiving Chemotherapy  Today you received the following chemotherapy agents   To help prevent nausea and vomiting after your treatment, we encourage you to take your nausea medication   If you develop nausea and vomiting that is not controlled by your nausea medication, call the clinic.   BELOW ARE SYMPTOMS THAT SHOULD BE REPORTED IMMEDIATELY:  *FEVER GREATER THAN 100.5 F  *CHILLS WITH OR WITHOUT FEVER  NAUSEA AND VOMITING THAT IS NOT CONTROLLED WITH YOUR NAUSEA MEDICATION  *UNUSUAL SHORTNESS OF BREATH  *UNUSUAL BRUISING OR BLEEDING  TENDERNESS IN MOUTH AND THROAT WITH OR WITHOUT PRESENCE OF ULCERS  *URINARY PROBLEMS  *BOWEL PROBLEMS  UNUSUAL RASH Items with * indicate a potential emergency and should be followed up as soon as possible.  Feel free to call the clinic should you have any questions or concerns. The clinic phone number is [phone].  Please show the CHEMO ALERT CARD at check-in to the Emergency Department and triage nurse.   

## 2020-05-18 NOTE — Progress Notes (Signed)
Labs WNL for treatment, okay for treatment today. Vital signs WNL.  Tolerated velcade today.  Discharged ambulatory in stable condition.

## 2020-06-01 ENCOUNTER — Inpatient Hospital Stay (HOSPITAL_COMMUNITY): Payer: BC Managed Care – PPO

## 2020-06-01 ENCOUNTER — Other Ambulatory Visit: Payer: Self-pay

## 2020-06-01 ENCOUNTER — Encounter (HOSPITAL_COMMUNITY): Payer: Self-pay

## 2020-06-01 VITALS — BP 151/90 | HR 59 | Temp 96.7°F | Resp 18 | Wt 256.9 lb

## 2020-06-01 DIAGNOSIS — C9000 Multiple myeloma not having achieved remission: Secondary | ICD-10-CM

## 2020-06-01 DIAGNOSIS — Z5112 Encounter for antineoplastic immunotherapy: Secondary | ICD-10-CM | POA: Diagnosis not present

## 2020-06-01 LAB — COMPREHENSIVE METABOLIC PANEL
ALT: 16 U/L (ref 0–44)
AST: 18 U/L (ref 15–41)
Albumin: 4 g/dL (ref 3.5–5.0)
Alkaline Phosphatase: 53 U/L (ref 38–126)
Anion gap: 10 (ref 5–15)
BUN: 21 mg/dL — ABNORMAL HIGH (ref 6–20)
CO2: 26 mmol/L (ref 22–32)
Calcium: 9.4 mg/dL (ref 8.9–10.3)
Chloride: 104 mmol/L (ref 98–111)
Creatinine, Ser: 1.01 mg/dL — ABNORMAL HIGH (ref 0.44–1.00)
GFR, Estimated: >60 mL/min (ref >60)
Glucose, Bld: 114 mg/dL — ABNORMAL HIGH (ref 70–99)
Potassium: 3.9 mmol/L (ref 3.5–5.1)
Sodium: 140 mmol/L (ref 135–145)
Total Bilirubin: 0.7 mg/dL (ref 0.3–1.2)
Total Protein: 6.8 g/dL (ref 6.5–8.1)

## 2020-06-01 LAB — CBC WITH DIFFERENTIAL/PLATELET
Abs Immature Granulocytes: 0 10*3/uL (ref 0.00–0.07)
Basophils Absolute: 0 10*3/uL (ref 0.0–0.1)
Basophils Relative: 0 %
Eosinophils Absolute: 0 10*3/uL (ref 0.0–0.5)
Eosinophils Relative: 1 %
HCT: 35.5 % — ABNORMAL LOW (ref 36.0–46.0)
Hemoglobin: 11.5 g/dL — ABNORMAL LOW (ref 12.0–15.0)
Immature Granulocytes: 0 %
Lymphocytes Relative: 36 %
Lymphs Abs: 1.1 10*3/uL (ref 0.7–4.0)
MCH: 30.9 pg (ref 26.0–34.0)
MCHC: 32.4 g/dL (ref 30.0–36.0)
MCV: 95.4 fL (ref 80.0–100.0)
Monocytes Absolute: 0.4 10*3/uL (ref 0.1–1.0)
Monocytes Relative: 12 %
Neutro Abs: 1.5 10*3/uL — ABNORMAL LOW (ref 1.7–7.7)
Neutrophils Relative %: 51 %
Platelets: 160 10*3/uL (ref 150–400)
RBC: 3.72 MIL/uL — ABNORMAL LOW (ref 3.87–5.11)
RDW: 14.8 % (ref 11.5–15.5)
WBC: 3 10*3/uL — ABNORMAL LOW (ref 4.0–10.5)
nRBC: 0 % (ref 0.0–0.2)

## 2020-06-01 MED ORDER — PROCHLORPERAZINE MALEATE 10 MG PO TABS: 10.0000 mg | ORAL_TABLET | Freq: Once | ORAL | Status: DC

## 2020-06-01 MED ORDER — BORTEZOMIB CHEMO SQ INJECTION 3.5 MG (2.5MG/ML)
1.3000 mg/m2 | Freq: Once | INTRAMUSCULAR | Status: AC
  Administered 2020-06-01: 3 mg via SUBCUTANEOUS
  Filled 2020-06-01: qty 1.2

## 2020-06-01 NOTE — Progress Notes (Signed)
Joanna Reid presents today for injection per the provider's orders.  Velcade administration without incident; injection site WNL; see MAR for injection details.  Patient tolerated procedure well and without incident.  No questions or complaints noted at this time.  Discharged ambulatory in stable condition.

## 2020-06-15 ENCOUNTER — Inpatient Hospital Stay (HOSPITAL_COMMUNITY): Payer: BC Managed Care – PPO | Attending: Hematology and Oncology

## 2020-06-15 ENCOUNTER — Other Ambulatory Visit: Payer: Self-pay

## 2020-06-15 ENCOUNTER — Encounter (HOSPITAL_COMMUNITY): Payer: Self-pay

## 2020-06-15 ENCOUNTER — Inpatient Hospital Stay (HOSPITAL_COMMUNITY): Payer: BC Managed Care – PPO

## 2020-06-15 VITALS — BP 143/90 | HR 71 | Temp 96.8°F | Resp 18 | Wt 254.6 lb

## 2020-06-15 DIAGNOSIS — C9000 Multiple myeloma not having achieved remission: Secondary | ICD-10-CM | POA: Insufficient documentation

## 2020-06-15 DIAGNOSIS — Z5112 Encounter for antineoplastic immunotherapy: Secondary | ICD-10-CM | POA: Insufficient documentation

## 2020-06-15 LAB — COMPREHENSIVE METABOLIC PANEL
ALT: 15 U/L (ref 0–44)
AST: 20 U/L (ref 15–41)
Albumin: 3.8 g/dL (ref 3.5–5.0)
Alkaline Phosphatase: 55 U/L (ref 38–126)
Anion gap: 9 (ref 5–15)
BUN: 26 mg/dL — ABNORMAL HIGH (ref 6–20)
CO2: 21 mmol/L — ABNORMAL LOW (ref 22–32)
Calcium: 8.5 mg/dL — ABNORMAL LOW (ref 8.9–10.3)
Chloride: 108 mmol/L (ref 98–111)
Creatinine, Ser: 0.92 mg/dL (ref 0.44–1.00)
GFR, Estimated: >60 mL/min (ref >60)
Glucose, Bld: 150 mg/dL — ABNORMAL HIGH (ref 70–99)
Potassium: 3.7 mmol/L (ref 3.5–5.1)
Sodium: 138 mmol/L (ref 135–145)
Total Bilirubin: 0.6 mg/dL (ref 0.3–1.2)
Total Protein: 6.6 g/dL (ref 6.5–8.1)

## 2020-06-15 LAB — CBC WITH DIFFERENTIAL/PLATELET
Abs Immature Granulocytes: 0.03 10*3/uL (ref 0.00–0.07)
Basophils Absolute: 0 10*3/uL (ref 0.0–0.1)
Basophils Relative: 1 %
Eosinophils Absolute: 0 10*3/uL (ref 0.0–0.5)
Eosinophils Relative: 1 %
HCT: 36.5 % (ref 36.0–46.0)
Hemoglobin: 11.7 g/dL — ABNORMAL LOW (ref 12.0–15.0)
Immature Granulocytes: 1 %
Lymphocytes Relative: 25 %
Lymphs Abs: 1 10*3/uL (ref 0.7–4.0)
MCH: 31 pg (ref 26.0–34.0)
MCHC: 32.1 g/dL (ref 30.0–36.0)
MCV: 96.6 fL (ref 80.0–100.0)
Monocytes Absolute: 0.6 10*3/uL (ref 0.1–1.0)
Monocytes Relative: 16 %
Neutro Abs: 2.3 10*3/uL (ref 1.7–7.7)
Neutrophils Relative %: 56 %
Platelets: 172 10*3/uL (ref 150–400)
RBC: 3.78 MIL/uL — ABNORMAL LOW (ref 3.87–5.11)
RDW: 14.6 % (ref 11.5–15.5)
WBC: 4 10*3/uL (ref 4.0–10.5)
nRBC: 0 % (ref 0.0–0.2)

## 2020-06-15 MED ORDER — PROCHLORPERAZINE MALEATE 10 MG PO TABS: 10.0000 mg | ORAL_TABLET | Freq: Once | ORAL | Status: DC

## 2020-06-15 MED ORDER — BORTEZOMIB CHEMO SQ INJECTION 3.5 MG (2.5MG/ML)
1.3000 mg/m2 | Freq: Once | INTRAMUSCULAR | Status: AC
  Administered 2020-06-15: 3 mg via SUBCUTANEOUS
  Filled 2020-06-15: qty 1.2

## 2020-06-15 NOTE — Progress Notes (Signed)
Joanna Reid presents today for injection per the provider's orders.  Velcade administration without incident; injection site WNL; see MAR for injection details.  Patient tolerated procedure well and without incident.  No questions or complaints noted at this time.  Discharged ambulatory in stable condition.

## 2020-06-22 ENCOUNTER — Other Ambulatory Visit (HOSPITAL_COMMUNITY): Payer: BC Managed Care – PPO

## 2020-06-22 ENCOUNTER — Ambulatory Visit (HOSPITAL_COMMUNITY): Payer: BC Managed Care – PPO

## 2020-06-29 ENCOUNTER — Other Ambulatory Visit: Payer: Self-pay

## 2020-06-29 ENCOUNTER — Inpatient Hospital Stay (HOSPITAL_COMMUNITY): Payer: BC Managed Care – PPO

## 2020-06-29 VITALS — BP 140/85 | HR 63 | Temp 96.8°F | Resp 18 | Wt 257.6 lb

## 2020-06-29 DIAGNOSIS — C9000 Multiple myeloma not having achieved remission: Secondary | ICD-10-CM

## 2020-06-29 DIAGNOSIS — C9001 Multiple myeloma in remission: Secondary | ICD-10-CM

## 2020-06-29 DIAGNOSIS — Z5112 Encounter for antineoplastic immunotherapy: Secondary | ICD-10-CM | POA: Diagnosis not present

## 2020-06-29 LAB — COMPREHENSIVE METABOLIC PANEL
ALT: 17 U/L (ref 0–44)
AST: 22 U/L (ref 15–41)
Albumin: 4 g/dL (ref 3.5–5.0)
Alkaline Phosphatase: 64 U/L (ref 38–126)
Anion gap: 9 (ref 5–15)
BUN: 26 mg/dL — ABNORMAL HIGH (ref 6–20)
CO2: 27 mmol/L (ref 22–32)
Calcium: 9.4 mg/dL (ref 8.9–10.3)
Chloride: 103 mmol/L (ref 98–111)
Creatinine, Ser: 1.04 mg/dL — ABNORMAL HIGH (ref 0.44–1.00)
GFR, Estimated: >60 mL/min (ref >60)
Glucose, Bld: 85 mg/dL (ref 70–99)
Potassium: 3.8 mmol/L (ref 3.5–5.1)
Sodium: 139 mmol/L (ref 135–145)
Total Bilirubin: 0.8 mg/dL (ref 0.3–1.2)
Total Protein: 6.9 g/dL (ref 6.5–8.1)

## 2020-06-29 LAB — CBC WITH DIFFERENTIAL/PLATELET
Abs Immature Granulocytes: 0 10*3/uL (ref 0.00–0.07)
Basophils Absolute: 0 10*3/uL (ref 0.0–0.1)
Basophils Relative: 1 %
Eosinophils Absolute: 0 10*3/uL (ref 0.0–0.5)
Eosinophils Relative: 1 %
HCT: 36.5 % (ref 36.0–46.0)
Hemoglobin: 11.7 g/dL — ABNORMAL LOW (ref 12.0–15.0)
Immature Granulocytes: 0 %
Lymphocytes Relative: 33 %
Lymphs Abs: 1.2 10*3/uL (ref 0.7–4.0)
MCH: 30.6 pg (ref 26.0–34.0)
MCHC: 32.1 g/dL (ref 30.0–36.0)
MCV: 95.5 fL (ref 80.0–100.0)
Monocytes Absolute: 0.5 10*3/uL (ref 0.1–1.0)
Monocytes Relative: 12 %
Neutro Abs: 1.9 10*3/uL (ref 1.7–7.7)
Neutrophils Relative %: 53 %
Platelets: 162 10*3/uL (ref 150–400)
RBC: 3.82 MIL/uL — ABNORMAL LOW (ref 3.87–5.11)
RDW: 14.6 % (ref 11.5–15.5)
WBC: 3.7 10*3/uL — ABNORMAL LOW (ref 4.0–10.5)
nRBC: 0 % (ref 0.0–0.2)

## 2020-06-29 LAB — MAGNESIUM: Magnesium: 1.8 mg/dL (ref 1.7–2.4)

## 2020-06-29 LAB — LACTATE DEHYDROGENASE: LDH: 170 U/L (ref 98–192)

## 2020-06-29 MED ORDER — BORTEZOMIB CHEMO SQ INJECTION 3.5 MG (2.5MG/ML)
1.3000 mg/m2 | Freq: Once | INTRAMUSCULAR | Status: AC
  Administered 2020-06-29: 3 mg via SUBCUTANEOUS
  Filled 2020-06-29: qty 1.2

## 2020-06-29 MED ORDER — PROCHLORPERAZINE MALEATE 10 MG PO TABS: 10.0000 mg | ORAL_TABLET | Freq: Once | ORAL | Status: DC

## 2020-06-29 NOTE — Progress Notes (Signed)
Patient presents today for Velcade and Zometa.  Patient had a root canal on December 6th.  Message notification sent to [employer]. Tanner PA.  Message received to hold Zometa but okay to proceed with Velcade.  Patients labs reviewed by PA and are with in parameters for treatment.  Vital signs with in parameters for treatment.  No new complaints since last visit.  Velcade injection given today per MD orders.  Tolerated injection without adverse affects.  Injection site WNL.  No complaints at this time.  Discharge from clinic ambulatory in stable condition.  Alert and oriented X 3.  Follow up with [facility] as scheduled.

## 2020-06-29 NOTE — Patient Instructions (Signed)
[facility] Discharge Instructions for Patients Receiving Chemotherapy  Today you received the following chemotherapy agents   To help prevent nausea and vomiting after your treatment, we encourage you to take your nausea medication   If you develop nausea and vomiting that is not controlled by your nausea medication, call the clinic.   BELOW ARE SYMPTOMS THAT SHOULD BE REPORTED IMMEDIATELY:  *FEVER GREATER THAN 100.5 F  *CHILLS WITH OR WITHOUT FEVER  NAUSEA AND VOMITING THAT IS NOT CONTROLLED WITH YOUR NAUSEA MEDICATION  *UNUSUAL SHORTNESS OF BREATH  *UNUSUAL BRUISING OR BLEEDING  TENDERNESS IN MOUTH AND THROAT WITH OR WITHOUT PRESENCE OF ULCERS  *URINARY PROBLEMS  *BOWEL PROBLEMS  UNUSUAL RASH Items with * indicate a potential emergency and should be followed up as soon as possible.  Feel free to call the clinic should you have any questions or concerns. The clinic phone number is [phone].  Please show the CHEMO ALERT CARD at check-in to the Emergency Department and triage nurse.   

## 2020-06-30 LAB — KAPPA/LAMBDA LIGHT CHAINS
Kappa free light chain: 21.7 mg/L — ABNORMAL HIGH (ref 3.3–19.4)
Kappa, lambda light chain ratio: 1.32 (ref 0.26–1.65)
Lambda free light chains: 16.4 mg/L (ref 5.7–26.3)

## 2020-07-02 LAB — PROTEIN ELECTROPHORESIS, SERUM
A/G Ratio: 1.4 (ref 0.7–1.7)
Albumin ELP: 3.8 g/dL (ref 2.9–4.4)
Alpha-1-Globulin: 0.3 g/dL (ref 0.0–0.4)
Alpha-2-Globulin: 0.8 g/dL (ref 0.4–1.0)
Beta Globulin: 1 g/dL (ref 0.7–1.3)
Gamma Globulin: 0.7 g/dL (ref 0.4–1.8)
Globulin, Total: 2.8 g/dL (ref 2.2–3.9)
Total Protein ELP: 6.6 g/dL (ref 6.0–8.5)

## 2020-07-03 LAB — IMMUNOFIXATION ELECTROPHORESIS
IgA: 82 mg/dL — ABNORMAL LOW (ref 87–352)
IgG (Immunoglobin G), Serum: 695 mg/dL (ref 586–1602)
IgM (Immunoglobulin M), Srm: 19 mg/dL — ABNORMAL LOW (ref 26–217)
Total Protein ELP: 6.4 g/dL (ref 6.0–8.5)

## 2020-07-11 ENCOUNTER — Other Ambulatory Visit (HOSPITAL_COMMUNITY): Payer: Self-pay | Admitting: Hematology

## 2020-07-11 DIAGNOSIS — C9000 Multiple myeloma not having achieved remission: Secondary | ICD-10-CM

## 2020-07-13 ENCOUNTER — Inpatient Hospital Stay (HOSPITAL_COMMUNITY): Payer: BC Managed Care – PPO | Attending: Hematology

## 2020-07-13 ENCOUNTER — Inpatient Hospital Stay (HOSPITAL_BASED_OUTPATIENT_CLINIC_OR_DEPARTMENT_OTHER): Payer: BC Managed Care – PPO | Admitting: Hematology

## 2020-07-13 ENCOUNTER — Other Ambulatory Visit: Payer: Self-pay

## 2020-07-13 ENCOUNTER — Inpatient Hospital Stay (HOSPITAL_COMMUNITY): Payer: BC Managed Care – PPO

## 2020-07-13 VITALS — BP 123/94 | HR 72 | Temp 96.6°F | Resp 18 | Wt 255.3 lb

## 2020-07-13 DIAGNOSIS — C9000 Multiple myeloma not having achieved remission: Secondary | ICD-10-CM | POA: Insufficient documentation

## 2020-07-13 DIAGNOSIS — Z79899 Other long term (current) drug therapy: Secondary | ICD-10-CM | POA: Diagnosis not present

## 2020-07-13 DIAGNOSIS — C9001 Multiple myeloma in remission: Secondary | ICD-10-CM

## 2020-07-13 DIAGNOSIS — Z5112 Encounter for antineoplastic immunotherapy: Secondary | ICD-10-CM | POA: Insufficient documentation

## 2020-07-13 LAB — MAGNESIUM: Magnesium: 1.6 mg/dL — ABNORMAL LOW (ref 1.7–2.4)

## 2020-07-13 LAB — COMPREHENSIVE METABOLIC PANEL
ALT: 15 U/L (ref 0–44)
AST: 21 U/L (ref 15–41)
Albumin: 4.2 g/dL (ref 3.5–5.0)
Alkaline Phosphatase: 70 U/L (ref 38–126)
Anion gap: 10 (ref 5–15)
BUN: 31 mg/dL — ABNORMAL HIGH (ref 6–20)
CO2: 24 mmol/L (ref 22–32)
Calcium: 9.6 mg/dL (ref 8.9–10.3)
Chloride: 104 mmol/L (ref 98–111)
Creatinine, Ser: 1.15 mg/dL — ABNORMAL HIGH (ref 0.44–1.00)
GFR, Estimated: 56 mL/min — ABNORMAL LOW (ref >60)
Glucose, Bld: 133 mg/dL — ABNORMAL HIGH (ref 70–99)
Potassium: 4.1 mmol/L (ref 3.5–5.1)
Sodium: 138 mmol/L (ref 135–145)
Total Bilirubin: 0.9 mg/dL (ref 0.3–1.2)
Total Protein: 7.4 g/dL (ref 6.5–8.1)

## 2020-07-13 LAB — CBC WITH DIFFERENTIAL/PLATELET
Abs Immature Granulocytes: 0.01 10*3/uL (ref 0.00–0.07)
Basophils Absolute: 0 10*3/uL (ref 0.0–0.1)
Basophils Relative: 1 %
Eosinophils Absolute: 0 10*3/uL (ref 0.0–0.5)
Eosinophils Relative: 1 %
HCT: 36.6 % (ref 36.0–46.0)
Hemoglobin: 12.2 g/dL (ref 12.0–15.0)
Immature Granulocytes: 0 %
Lymphocytes Relative: 29 %
Lymphs Abs: 1.1 10*3/uL (ref 0.7–4.0)
MCH: 31.3 pg (ref 26.0–34.0)
MCHC: 33.3 g/dL (ref 30.0–36.0)
MCV: 93.8 fL (ref 80.0–100.0)
Monocytes Absolute: 0.4 10*3/uL (ref 0.1–1.0)
Monocytes Relative: 12 %
Neutro Abs: 2.1 10*3/uL (ref 1.7–7.7)
Neutrophils Relative %: 57 %
Platelets: 178 10*3/uL (ref 150–400)
RBC: 3.9 MIL/uL (ref 3.87–5.11)
RDW: 14.8 % (ref 11.5–15.5)
WBC: 3.7 10*3/uL — ABNORMAL LOW (ref 4.0–10.5)
nRBC: 0 % (ref 0.0–0.2)

## 2020-07-13 MED ORDER — BORTEZOMIB CHEMO SQ INJECTION 3.5 MG (2.5MG/ML)
1.3000 mg/m2 | Freq: Once | INTRAMUSCULAR | Status: AC
  Administered 2020-07-13: 3 mg via SUBCUTANEOUS
  Filled 2020-07-13: qty 1.2

## 2020-07-13 NOTE — Progress Notes (Signed)
Pt here for velcade and zometa.  Ca level is 9.6.  Pt taking calcium with vitamin D as prescribed.  Magnesium 1.6.  Okay for treatment with velcade today.  Zometa being held dur to root canal.  Pt took compazine at home.  Joanna Reid presents today for injection per the provider's orders.  valcade administration without incident; injection site WNL; see MAR for injection details.  Patient tolerated procedure well and without incident.  No questions or complaints noted at this time.  Pt discharged ambulatory in stable condition.

## 2020-07-13 NOTE — Progress Notes (Signed)
Joanna Reid, [place] [address]   CLINIC:  Medical Oncology/Hematology  PCP:  [name], DO [address] / [place] [address] [phone]   REASON FOR VISIT:  Follow-up for multiple myeloma  PRIOR THERAPY:  1. RVD x 4 cycles from 11/01/2017 through 01/14/2018. 2. Stem cell transplant on 02/20/2018. 3. Velcade from 06/11/2018 to 12/22/2019.  NGS Results: Not done  CURRENT THERAPY: Bortezomib every 2 weeks  BRIEF ONCOLOGIC HISTORY:  Oncology History  Multiple myeloma ([place])  10/29/2017 Initial Diagnosis   Multiple myeloma ([place])   11/01/2017 - 01/24/2018 Chemotherapy   The patient had dexamethasone (DECADRON) 4 MG tablet, 1 of 1 cycle, Start date: --, End date: -- lenalidomide (REVLIMID) 25 MG capsule, 1 of 1 cycle, Start date: --, End date: -- bortezomib SQ (VELCADE) chemo injection 3 mg, 1.3 mg/m2 = 3 mg, Subcutaneous,  Once, 5 of 5 cycles Administration: 3 mg (11/01/2017), 3 mg (11/08/2017), 3 mg (11/05/2017), 3 mg (11/22/2017), 3 mg (11/12/2017), 3 mg (11/29/2017), 3 mg (11/26/2017), 3 mg (12/03/2017), 3 mg (12/13/2017), 3 mg (12/20/2017), 3 mg (12/17/2017), 3 mg (12/24/2017), 3 mg (01/03/2018), 3 mg (01/10/2018), 3 mg (01/07/2018), 3 mg (01/14/2018), 3 mg (01/24/2018)  for chemotherapy treatment.    06/11/2018 -  Chemotherapy   The patient had bortezomib SQ (VELCADE) chemo injection 3 mg, 1.3 mg/m2 = 3 mg, Subcutaneous,  Once, 50 of 51 cycles Administration: 3 mg (06/11/2018), 3 mg (07/29/2018), 3 mg (08/21/2018), 3 mg (09/04/2018), 3 mg (09/18/2018), 3 mg (10/02/2018), 3 mg (10/16/2018), 3 mg (10/29/2018), 3 mg (11/13/2018), 3 mg (06/18/2018), 3 mg (06/25/2018), 3 mg (07/01/2018), 3 mg (07/08/2018), 3 mg (07/15/2018), 3 mg (11/27/2018), 3 mg (12/11/2018), 3 mg (12/25/2018), 3 mg (01/08/2019), 3 mg (01/22/2019), 3 mg (02/05/2019), 3 mg (02/19/2019), 3 mg (03/05/2019), 3 mg (03/19/2019), 3 mg (04/02/2019), 3 mg (04/16/2019), 3 mg (04/30/2019), 3 mg (05/20/2019), 3 mg (06/03/2019), 3  mg (06/17/2019), 3 mg (07/01/2019), 3 mg (07/15/2019), 3 mg (07/29/2019), 3 mg (08/12/2019), 3 mg (08/26/2019), 3 mg (09/09/2019), 3 mg (10/12/2019), 3 mg (10/26/2019), 3 mg (11/09/2019), 3 mg (11/23/2019), 3 mg (12/08/2019), 3 mg (12/22/2019), 3 mg (03/09/2020), 3 mg (03/23/2020), 3 mg (04/06/2020), 3 mg (04/20/2020), 3 mg (05/04/2020), 3 mg (05/18/2020), 3 mg (06/01/2020), 3 mg (06/15/2020), 3 mg (06/29/2020)  for chemotherapy treatment.      CANCER STAGING: Cancer Staging Multiple myeloma ([place]) Staging form: Plasma Cell Myeloma and Plasma Cell Disorders, AJCC 8th Edition - Clinical: No stage assigned - Unsigned - Clinical: No stage assigned - Unsigned   INTERVAL HISTORY:  [name], a 55 y.o. female, returns for routine follow-up and consideration for next cycle of chemotherapy. Reyes was last seen on 05/04/2020.  Due for cycle #51 of bortezomib today.   Overall, she tells me she has been feeling pretty well. She is tolerating the Velcade well and her numbness is stable. Her speech has improved, though she still stutters when she gets excited. She is taking magnesium QD. She complains of having 5 UTI's since June 2021 and was prescribed a course of Flagyl followed by Macrobid by Dr. Lonie Peak at [facility]. She denies having rashes or itching.  She stopped Zometa because she had a root canal in beginning of December and will have the second part of the procedure in late January/early February.  Overall, she feels ready for next cycle of chemo today.    REVIEW OF SYSTEMS:  Review of Systems  Constitutional: Positive  for fatigue (75%). Negative for appetite change.  Genitourinary: Positive for difficulty urinating (UTI's x 5 since 12/2019).   Musculoskeletal: Positive for arthralgias (4/10 knee pain).  Skin: Negative for itching and rash.  Neurological: Positive for numbness (stable).  All other systems reviewed and are negative.   PAST MEDICAL/SURGICAL HISTORY:  Past Medical History:  Diagnosis Date   . Acid reflux   . Allergic rhinitis   . Cancer ([place])    multiple myeloma  . Diabetes mellitus    type 2  . Gout   . Gout   . HBP (high blood pressure)   . History of kidney stones   . Migraines    Past Surgical History:  Procedure Laterality Date  . BREAST CYST EXCISION Left    2009 no visible scar on skin  . CESAREAN SECTION    . COLONOSCOPY WITH PROPOFOL N/A 12/25/2019   Procedure: COLONOSCOPY WITH PROPOFOL;  Surgeon: Rogene Houston, MD;  Location: [facility];  Service: Endoscopy;  Laterality: N/A;  730  . EXTRACORPOREAL SHOCK WAVE LITHOTRIPSY Left 10/10/2017   Procedure: LEFT EXTRACORPOREAL SHOCK WAVE LITHOTRIPSY (ESWL);  Surgeon: Bjorn Loser, MD;  Location: [facility] ORS;  Service: Urology;  Laterality: Left;  . EYE SURGERY    . HEMORRHOID SURGERY N/A 11/19/2012   Procedure: HEMORRHOIDECTOMY;  Surgeon: Jamesetta So, MD;  Location: [facility] ORS;  Service: General;  Laterality: N/A;  . kidney stones  1998  . LAPAROSCOPIC UNILATERAL SALPINGO OOPHERECTOMY  05/14/2012   Procedure: LAPAROSCOPIC UNILATERAL SALPINGO OOPHORECTOMY;  Surgeon: [name], MD;  Location: [facility] ORS;  Service: Gynecology;  Laterality: Right;  laparoscopic right salpingo-oophorectomy  . PARTIAL HYSTERECTOMY    . POLYPECTOMY  12/25/2019   Procedure: POLYPECTOMY;  Surgeon: Rogene Houston, MD;  Location: [facility];  Service: Endoscopy;;  . TONSILECTOMY, ADENOIDECTOMY, BILATERAL MYRINGOTOMY AND TUBES    . VESICOVAGINAL FISTULA CLOSURE W/ TAH      SOCIAL HISTORY:  Social History   Socioeconomic History  . Marital status: Married    Spouse name: Not on file  . Number of children: Not on file  . Years of education: 48  . Highest education level: Not on file  Occupational History    Employer: [employer]  Tobacco Use  . Smoking status: Former Smoker    Quit date: 02/27/1999    Years since quitting: 21.3  . Smokeless tobacco: Never Used  . Tobacco comment: socially   Vaping Use  . Vaping Use: Never used   Substance and Sexual Activity  . Alcohol use: No    Alcohol/week: 0.0 standard drinks  . Drug use: No  . Sexual activity: Yes  Other Topics Concern  . Not on file  Social History Narrative  . Not on file   Social Determinants of Health   Financial Resource Strain: Low Risk   . Difficulty of Paying Living Expenses: Not hard at all  Food Insecurity: No Food Insecurity  . Worried About Charity fundraiser in the Last Year: Never true  . Ran Out of Food in the Last Year: Never true  Transportation Needs: No Transportation Needs  . Lack of Transportation (Medical): No  . Lack of Transportation (Non-Medical): No  Physical Activity: Insufficiently Active  . Days of Exercise per Week: 7 days  . Minutes of Exercise per Session: 10 min  Stress: Not on file  Social Connections: Socially Integrated  . Frequency of Communication with Friends and Family: More than three times a week  .  Frequency of Social Gatherings with Friends and Family: More than three times a week  . Attends Religious Services: More than 4 times per year  . Active Member of Clubs or Organizations: Yes  . Attends Archivist Meetings: More than 4 times per year  . Marital Status: Married  Human resources officer Violence: Not At Risk  . Fear of Current or Ex-Partner: No  . Emotionally Abused: No  . Physically Abused: No  . Sexually Abused: No    FAMILY HISTORY:  Family History  Problem Relation Age of Onset  . Arthritis Other   . Cancer Other   . Diabetes Other   . Hypertension Mother   . Dementia Mother   . Diabetes Father   . ALS Father   . Diabetes Brother   . Hypertension Brother   . Cancer Paternal Aunt   . COPD Maternal Grandmother   . Cancer Maternal Grandfather   . Anesthesia problems Paternal Grandfather     CURRENT MEDICATIONS:  Current Outpatient Medications  Medication Sig Dispense Refill  . acyclovir (ZOVIRAX) 400 MG tablet TAKE (1) TABLET BY MOUTH TWICE DAILY. 60 tablet 3  .  albuterol (PROVENTIL HFA;VENTOLIN HFA) 108 (90 Base) MCG/ACT inhaler Inhale 2 puffs into the lungs every 4 (four) hours as needed for wheezing or shortness of breath. 1 Inhaler 3  . allopurinol (ZYLOPRIM) 300 MG tablet TAKE 1 TABLET DAILY (Patient taking differently: Take 300 mg by mouth daily.) 90 tablet 1  . amLODipine (NORVASC) 5 MG tablet Take 5 mg by mouth daily.    Marland Kitchen aspirin EC 81 MG tablet Take 1 tablet (81 mg total) by mouth daily. 30 tablet 11  . bumetanide (BUMEX) 0.5 MG tablet Take 0.5 mg by mouth daily.     . carvedilol (COREG) 25 MG tablet Take 1 tablet (25 mg total) by mouth 2 (two) times daily.    . diazepam (VALIUM) 5 MG tablet Take 1 tablet (5 mg total) by mouth at bedtime. 30 tablet 2  . Docosanol 10 % CREA Apply 1 application topically daily as needed (for nose).     Marland Kitchen docusate sodium (COLACE) 100 MG capsule Take 100 mg by mouth daily.     . fluconazole (DIFLUCAN) 100 MG tablet Take by mouth.    . fluticasone (FLONASE) 50 MCG/ACT nasal spray Place 2 sprays into both nostrils daily. 16 g 0  . losartan (COZAAR) 100 MG tablet Take 100 mg by mouth daily.    . magnesium oxide (MAG-OX) 400 (241.3 Mg) MG tablet Take 1 tablet (400 mg total) by mouth 3 (three) times daily. 90 tablet 3  . metFORMIN (GLUCOPHAGE) 500 MG tablet TAKE 1 TABLET BY MOUTH ONCE DAILY WITH BREAKFAST (Patient taking differently: Take 1,000 mg by mouth 2 (two) times daily with a meal.) 90 tablet 0  . nitrofurantoin, macrocrystal-monohydrate, (MACROBID) 100 MG capsule Take one capsule po BID for 7 days 14 capsule 0  . pantoprazole (PROTONIX) 40 MG tablet Take 1 tablet (40 mg total) by mouth daily. 90 tablet 1  . pregabalin (LYRICA) 200 MG capsule TAKE (1) CAPSULE BY MOUTH TWICE DAILY. 60 capsule 6  . prochlorperazine (COMPAZINE) 10 MG tablet every 14 (fourteen) days    . Propylene Glycol (SYSTANE BALANCE) 0.6 % SOLN Apply 1 drop to eye daily as needed (dry eye).     . rosuvastatin (CRESTOR) 10 MG tablet Take 1 tablet  (10 mg total) by mouth daily. 30 tablet 0  . TRULICITY 1.5 XH/3.7JI SOPN Inject  0.75 mg into the skin every Monday.     . valACYclovir (VALTREX) 1000 MG tablet TAKE 2 TABLETS NOW, THEN 2 TABLETS 12 HOURS LATER. 12 tablet 0   No current facility-administered medications for this visit.    ALLERGIES:  No Known Allergies  PHYSICAL EXAM:  Performance status (ECOG): 1 - Symptomatic but completely ambulatory  Vitals:   07/13/20 1056  BP: (!) 123/94  Pulse: 72  Resp: 18  Temp: (!) 96.6 F (35.9 C)  SpO2: 98%   Wt Readings from Last 3 Encounters:  07/13/20 255 lb 4.7 oz (115.8 kg)  06/29/20 257 lb 9.6 oz (116.8 kg)  06/15/20 254 lb 9.6 oz (115.5 kg)   Physical Exam Vitals reviewed.  Constitutional:      Appearance: Normal appearance. She is obese.  Neurological:     General: No focal deficit present.     Mental Status: She is alert and oriented to person, place, and time.  Psychiatric:        Mood and Affect: Mood normal.        Behavior: Behavior normal.     LABORATORY DATA:  I have reviewed the labs as listed.  CBC Latest Ref Rng & Units 07/13/2020 06/29/2020 06/15/2020  WBC 4.0 - 10.5 K/uL 3.7(L) 3.7(L) 4.0  Hemoglobin 12.0 - 15.0 g/dL 12.2 11.7(L) 11.7(L)  Hematocrit 36.0 - 46.0 % 36.6 36.5 36.5  Platelets 150 - 400 K/uL 178 162 172   CMP Latest Ref Rng & Units 07/13/2020 06/29/2020 06/15/2020  Glucose 70 - 99 mg/dL 133(H) 85 150(H)  BUN 6 - 20 mg/dL 31(H) 26(H) 26(H)  Creatinine 0.44 - 1.00 mg/dL 1.15(H) 1.04(H) 0.92  Sodium 135 - 145 mmol/L 138 139 138  Potassium 3.5 - 5.1 mmol/L 4.1 3.8 3.7  Chloride 98 - 111 mmol/L 104 103 108  CO2 22 - 32 mmol/L 24 27 21(L)  Calcium 8.9 - 10.3 mg/dL 9.6 9.4 8.5(L)  Total Protein 6.5 - 8.1 g/dL 7.4 6.9 6.6  Total Bilirubin 0.3 - 1.2 mg/dL 0.9 0.8 0.6  Alkaline Phos 38 - 126 U/L 70 64 55  AST 15 - 41 U/L _0 ALT 0 - 44 U/L _1 Lab Results  Component Value Date   TOTALPROTELP 6.4 06/29/2020   ALBUMINELP 3.8  06/29/2020   A1GS 0.3 06/29/2020   A2GS 0.8 06/29/2020   BETS 1.0 06/29/2020   GAMS 0.7 06/29/2020   MSPIKE Not Observed 06/29/2020   SPEI Comment 06/29/2020    Lab Results  Component Value Date   KPAFRELGTCHN 21.7 (H) 06/29/2020   LAMBDASER 16.4 06/29/2020   KAPLAMBRATIO 1.32 06/29/2020    DIAGNOSTIC IMAGING:  I have independently reviewed the scans and discussed with the patient. No results found.   ASSESSMENT:  1. IgG lambda multiple myeloma, stage III, del 17 p: -4 cycles of RVD from 11/01/2017 through 01/14/2018. -Stem cell transplant on 02/20/2018 at [facility]. -Maintenance Velcade every 2 weeks and Revlimid 10 mg 3 weeks on/1 week off started on 07/29/2018. -Myeloma panel from 12/08/2018 shows M spike not observed. Kappa light chains of 32.1 with ratio of 1.64. Immunofixation was negative. -She hadCVAon 12/26/2019 with aphasia. MRI of the brain on 12/27/2019 shows acute ischemic nonhemorrhagic left MCA territory infarct involving left parietal lobe, corresponding with perfusion deficit on CT scan angiogram. No associated hemorrhage or mass-effect. Additional few scattered punctate acute ischemic nonhemorrhagic infarcts involving bilateral frontal and parietal lobes as well as left cerebellum. -Last Velcade treatment was  on 12/22/2019. We have held Revlimid since then.  2. CVA with aphasia: -We held her myeloma treatments since CVA with aphasia on 12/26/2019. -Her aphasia is improving.   PLAN:  1. IgG lambda multiple myeloma, stage III, del 17 p: -Reviewed myeloma labs from 06/29/2020.  M spike is not detected.  Kappa light chains are 21.7, ratio of 1.32.  Immunofixation was negative. -She reportedly had recurrent UTIs.  She is on Macrobid daily.  She is following with urology at [facility] on 08/16/2020. -Immunoglobulin IgG was 695. -We will continue Velcade every other week at this time.  We will continue to avoid Revlimid given her stroke. -RTC 12 weeks with repeat myeloma  labs.  2. Hypomagnesemia: -Magnesium today is 1.6.  Increase magnesium 400 mg twice daily.  3. Bone strengthening agents: -Continue Zometa every 3 months.  4. Peripheral neuropathy: -Continue Lyrica 200 mg twice daily.  5. ID prophylaxis: -Continue acyclovir twice daily.  6. Diabetes: -Continue Metformin and Trulicity.   Orders placed this encounter:  No orders of the defined types were placed in this encounter.    Derek Jack, MD [place] [phone]   I, Milinda Antis, am acting as a scribe for Dr. Sanda Linger.  I, Derek Jack MD, have reviewed the above documentation for accuracy and completeness, and I agree with the above.

## 2020-07-13 NOTE — Patient Instructions (Signed)
[facility] Discharge Instructions for Patients Receiving Chemotherapy  Today you received the following chemotherapy agents   To help prevent nausea and vomiting after your treatment, we encourage you to take your nausea medication   If you develop nausea and vomiting that is not controlled by your nausea medication, call the clinic.   BELOW ARE SYMPTOMS THAT SHOULD BE REPORTED IMMEDIATELY:  *FEVER GREATER THAN 100.5 F  *CHILLS WITH OR WITHOUT FEVER  NAUSEA AND VOMITING THAT IS NOT CONTROLLED WITH YOUR NAUSEA MEDICATION  *UNUSUAL SHORTNESS OF BREATH  *UNUSUAL BRUISING OR BLEEDING  TENDERNESS IN MOUTH AND THROAT WITH OR WITHOUT PRESENCE OF ULCERS  *URINARY PROBLEMS  *BOWEL PROBLEMS  UNUSUAL RASH Items with * indicate a potential emergency and should be followed up as soon as possible.  Feel free to call the clinic should you have any questions or concerns. The clinic phone number is [phone].  Please show the CHEMO ALERT CARD at check-in to the Emergency Department and triage nurse.   

## 2020-07-13 NOTE — Progress Notes (Signed)
Patient assessed and labs reviewed by Dr. Ellin Saba. Okay for treatment today. Primary RN and pharmacy aware.

## 2020-07-13 NOTE — Patient Instructions (Signed)
[facility] Discharge Instructions  You were seen today by Dr. Ellin Saba. He went over your recent results. You received your Velcade injection today; continue getting Velcade injections every 2 weeks. Start taking Mag-Ox twice daily, in the morning and evening. Dr. Ellin Saba will see you back in 3 months for labs and follow up.   Thank you for choosing [facility] at [facility] to provide your oncology and hematology care.  To afford each patient quality time with our provider, please arrive at least 15 minutes before your scheduled appointment time.   If you have a lab appointment with the Cancer Center please come in thru the Main Entrance and check in at the main information desk  You need to re-schedule your appointment should you arrive 10 or more minutes late.  We strive to give you quality time with our providers, and arriving late affects you and other patients whose appointments are after yours.  Also, if you no show three or more times for appointments you may be dismissed from the clinic at the providers discretion.     Again, thank you for choosing [facility].  Our hope is that these requests will decrease the amount of time that you wait before being seen by our physicians.       _____________________________________________________________  Should you have questions after your visit to [facility], please contact our office at [phone] between the hours of 8:00 a.m. and 4:30 p.m.  Voicemails left after 4:00 p.m. will not be returned until the following business day.  For prescription refill requests, have your pharmacy contact our office and allow 72 hours.    Cancer Center Support Programs:   > Cancer Support Group  2nd Tuesday of the month 1pm-2pm, Journey Room

## 2020-07-25 ENCOUNTER — Other Ambulatory Visit (HOSPITAL_COMMUNITY): Payer: Self-pay | Admitting: Hematology

## 2020-07-25 ENCOUNTER — Other Ambulatory Visit: Payer: Self-pay | Admitting: Family Medicine

## 2020-07-25 DIAGNOSIS — C9000 Multiple myeloma not having achieved remission: Secondary | ICD-10-CM

## 2020-07-25 DIAGNOSIS — K219 Gastro-esophageal reflux disease without esophagitis: Secondary | ICD-10-CM

## 2020-07-27 ENCOUNTER — Encounter (HOSPITAL_COMMUNITY): Payer: Self-pay

## 2020-07-27 ENCOUNTER — Inpatient Hospital Stay (HOSPITAL_COMMUNITY): Payer: BC Managed Care – PPO

## 2020-07-27 ENCOUNTER — Telehealth: Payer: Self-pay | Admitting: Family Medicine

## 2020-07-27 ENCOUNTER — Other Ambulatory Visit: Payer: Self-pay

## 2020-07-27 VITALS — BP 128/83 | HR 65 | Temp 96.7°F | Resp 20 | Wt 258.4 lb

## 2020-07-27 DIAGNOSIS — C9000 Multiple myeloma not having achieved remission: Secondary | ICD-10-CM

## 2020-07-27 DIAGNOSIS — Z5112 Encounter for antineoplastic immunotherapy: Secondary | ICD-10-CM | POA: Diagnosis not present

## 2020-07-27 LAB — COMPREHENSIVE METABOLIC PANEL WITH GFR
ALT: 16 U/L (ref 0–44)
AST: 14 U/L — ABNORMAL LOW (ref 15–41)
Albumin: 4.1 g/dL (ref 3.5–5.0)
Alkaline Phosphatase: 60 U/L (ref 38–126)
Anion gap: 9 (ref 5–15)
BUN: 32 mg/dL — ABNORMAL HIGH (ref 6–20)
CO2: 26 mmol/L (ref 22–32)
Calcium: 9.5 mg/dL (ref 8.9–10.3)
Chloride: 105 mmol/L (ref 98–111)
Creatinine, Ser: 1.12 mg/dL — ABNORMAL HIGH (ref 0.44–1.00)
GFR, Estimated: 58 mL/min — ABNORMAL LOW
Glucose, Bld: 98 mg/dL (ref 70–99)
Potassium: 3.8 mmol/L (ref 3.5–5.1)
Sodium: 140 mmol/L (ref 135–145)
Total Bilirubin: 0.5 mg/dL (ref 0.3–1.2)
Total Protein: 6.9 g/dL (ref 6.5–8.1)

## 2020-07-27 LAB — MAGNESIUM: Magnesium: 1.9 mg/dL (ref 1.7–2.4)

## 2020-07-27 LAB — CBC WITH DIFFERENTIAL/PLATELET
Abs Immature Granulocytes: 0.03 10*3/uL (ref 0.00–0.07)
Basophils Absolute: 0 10*3/uL (ref 0.0–0.1)
Basophils Relative: 1 %
Eosinophils Absolute: 0 10*3/uL (ref 0.0–0.5)
Eosinophils Relative: 0 %
HCT: 36.8 % (ref 36.0–46.0)
Hemoglobin: 11.9 g/dL — ABNORMAL LOW (ref 12.0–15.0)
Immature Granulocytes: 1 %
Lymphocytes Relative: 30 %
Lymphs Abs: 1.5 10*3/uL (ref 0.7–4.0)
MCH: 30.6 pg (ref 26.0–34.0)
MCHC: 32.3 g/dL (ref 30.0–36.0)
MCV: 94.6 fL (ref 80.0–100.0)
Monocytes Absolute: 1 10*3/uL (ref 0.1–1.0)
Monocytes Relative: 19 %
Neutro Abs: 2.4 10*3/uL (ref 1.7–7.7)
Neutrophils Relative %: 49 %
Platelets: 172 10*3/uL (ref 150–400)
RBC: 3.89 MIL/uL (ref 3.87–5.11)
RDW: 15.2 % (ref 11.5–15.5)
WBC: 5 10*3/uL (ref 4.0–10.5)
nRBC: 0 % (ref 0.0–0.2)

## 2020-07-27 MED ORDER — BORTEZOMIB CHEMO SQ INJECTION 3.5 MG (2.5MG/ML)
1.3000 mg/m2 | Freq: Once | INTRAMUSCULAR | Status: AC
  Administered 2020-07-27: 3 mg via SUBCUTANEOUS
  Filled 2020-07-27: qty 1.2

## 2020-07-27 MED ORDER — PROCHLORPERAZINE MALEATE 10 MG PO TABS: 10.0000 mg | ORAL_TABLET | Freq: Once | ORAL | Status: DC

## 2020-07-27 NOTE — Telephone Encounter (Signed)
Patient is needing refill on alloprinol 300 mg and flonase 50 mg called into [facility]  90 day supply. She states has new insurance and had to change mail order provider . She is unable to get medication on time and is almost out. Please advise

## 2020-07-27 NOTE — Progress Notes (Signed)
Joanna Reid tolerated Velcade injection well without complaints or incident. Labs reviewed prior to administering this medication. VSS Pt discharged self ambulatory in satisfactory condition 

## 2020-07-27 NOTE — Patient Instructions (Signed)
[facility] Discharge Instructions for Patients Receiving Chemotherapy   Beginning January 23rd 2017 lab work for the Cancer Center will be done in the  Main lab at [place] on 1st floor. If you have a lab appointment with the Cancer Center please come in thru the  Main Entrance and check in at the main information desk   Today you received the following chemotherapy agents Velcade. Follow-up as scheduled  To help prevent nausea and vomiting after your treatment, we encourage you to take your nausea medication   If you develop nausea and vomiting, or diarrhea that is not controlled by your medication, call the clinic.  The clinic phone number is [phone]. Office hours are Monday-Friday 8:30am-5:00pm.  BELOW ARE SYMPTOMS THAT SHOULD BE REPORTED IMMEDIATELY:  *FEVER GREATER THAN 101.0 F  *CHILLS WITH OR WITHOUT FEVER  NAUSEA AND VOMITING THAT IS NOT CONTROLLED WITH YOUR NAUSEA MEDICATION  *UNUSUAL SHORTNESS OF BREATH  *UNUSUAL BRUISING OR BLEEDING  TENDERNESS IN MOUTH AND THROAT WITH OR WITHOUT PRESENCE OF ULCERS  *URINARY PROBLEMS  *BOWEL PROBLEMS  UNUSUAL RASH Items with * indicate a potential emergency and should be followed up as soon as possible. If you have an emergency after office hours please contact your primary care physician or go to the nearest emergency department.  Please call the clinic during office hours if you have any questions or concerns.   You may also contact the Patient Navigator at [phone] should you have any questions or need assistance in obtaining follow up care.      Resources For Cancer Patients and their Caregivers ? American Cancer Society: Can assist with transportation, wigs, general needs, runs Look Good Feel Better.        1-888-227-6333 ? Cancer Care: Provides financial assistance, online support groups, medication/co-pay assistance.  1-800-813-HOPE (4673) ? [facility] Assists [place] cancer patients and their families through emotional , educational and financial support.  336-427-4357 ? [place] DSS Where to apply for food stamps, Medicaid and utility assistance. 336-342-1394 ? [facility]: Transportation to medical appointments. 336-347-2287 ? Social Security Administration: May apply for disability if have a Stage IV cancer. 336-342-7796 1-800-772-1213 ? [place] Aging, Disability and Transit Services: Assists with nutrition, care and transit needs. 336-349-2343         

## 2020-07-29 MED ORDER — FLUTICASONE PROPIONATE 50 MCG/ACT NA SUSP: 2.0000 | Freq: Every day | NASAL | 0 refills | Status: DC

## 2020-07-29 MED ORDER — ALLOPURINOL 300 MG PO TABS: ORAL_TABLET | ORAL | 0 refills | Status: DC

## 2020-07-29 NOTE — Telephone Encounter (Signed)
Prescriptions sent electronically to pharmacy. Patient notified. °

## 2020-07-29 NOTE — Telephone Encounter (Signed)
Ok pls give 90 day supply and 1 refill. Th. Dr. Lovena Le

## 2020-08-02 ENCOUNTER — Other Ambulatory Visit (HOSPITAL_COMMUNITY): Payer: Self-pay | Admitting: Hematology

## 2020-08-10 ENCOUNTER — Inpatient Hospital Stay (HOSPITAL_COMMUNITY): Payer: BC Managed Care – PPO | Attending: Hematology

## 2020-08-10 ENCOUNTER — Encounter (HOSPITAL_COMMUNITY): Payer: Self-pay

## 2020-08-10 ENCOUNTER — Inpatient Hospital Stay (HOSPITAL_COMMUNITY): Payer: BC Managed Care – PPO

## 2020-08-10 ENCOUNTER — Other Ambulatory Visit: Payer: Self-pay

## 2020-08-10 VITALS — BP 142/93 | HR 75 | Resp 18

## 2020-08-10 DIAGNOSIS — C9000 Multiple myeloma not having achieved remission: Secondary | ICD-10-CM | POA: Diagnosis present

## 2020-08-10 DIAGNOSIS — Z79899 Other long term (current) drug therapy: Secondary | ICD-10-CM | POA: Diagnosis not present

## 2020-08-10 DIAGNOSIS — Z5112 Encounter for antineoplastic immunotherapy: Secondary | ICD-10-CM | POA: Insufficient documentation

## 2020-08-10 LAB — CBC WITH DIFFERENTIAL/PLATELET
Abs Immature Granulocytes: 0.01 10*3/uL (ref 0.00–0.07)
Basophils Absolute: 0 10*3/uL (ref 0.0–0.1)
Basophils Relative: 1 %
Eosinophils Absolute: 0 10*3/uL (ref 0.0–0.5)
Eosinophils Relative: 1 %
HCT: 36 % (ref 36.0–46.0)
Hemoglobin: 11.6 g/dL — ABNORMAL LOW (ref 12.0–15.0)
Immature Granulocytes: 0 %
Lymphocytes Relative: 31 %
Lymphs Abs: 1.1 10*3/uL (ref 0.7–4.0)
MCH: 30.9 pg (ref 26.0–34.0)
MCHC: 32.2 g/dL (ref 30.0–36.0)
MCV: 95.7 fL (ref 80.0–100.0)
Monocytes Absolute: 0.4 10*3/uL (ref 0.1–1.0)
Monocytes Relative: 11 %
Neutro Abs: 1.9 10*3/uL (ref 1.7–7.7)
Neutrophils Relative %: 56 %
Platelets: 148 10*3/uL — ABNORMAL LOW (ref 150–400)
RBC: 3.76 MIL/uL — ABNORMAL LOW (ref 3.87–5.11)
RDW: 14.9 % (ref 11.5–15.5)
WBC: 3.4 10*3/uL — ABNORMAL LOW (ref 4.0–10.5)
nRBC: 0 % (ref 0.0–0.2)

## 2020-08-10 LAB — MAGNESIUM: Magnesium: 1.7 mg/dL (ref 1.7–2.4)

## 2020-08-10 LAB — COMPREHENSIVE METABOLIC PANEL
ALT: 16 U/L (ref 0–44)
AST: 19 U/L (ref 15–41)
Albumin: 3.8 g/dL (ref 3.5–5.0)
Alkaline Phosphatase: 62 U/L (ref 38–126)
Anion gap: 7 (ref 5–15)
BUN: 26 mg/dL — ABNORMAL HIGH (ref 6–20)
CO2: 26 mmol/L (ref 22–32)
Calcium: 9.3 mg/dL (ref 8.9–10.3)
Chloride: 104 mmol/L (ref 98–111)
Creatinine, Ser: 1.01 mg/dL — ABNORMAL HIGH (ref 0.44–1.00)
GFR, Estimated: >60 mL/min (ref >60)
Glucose, Bld: 128 mg/dL — ABNORMAL HIGH (ref 70–99)
Potassium: 4 mmol/L (ref 3.5–5.1)
Sodium: 137 mmol/L (ref 135–145)
Total Bilirubin: 1 mg/dL (ref 0.3–1.2)
Total Protein: 7.1 g/dL (ref 6.5–8.1)

## 2020-08-10 MED ORDER — BORTEZOMIB CHEMO SQ INJECTION 3.5 MG (2.5MG/ML)
1.3000 mg/m2 | Freq: Once | INTRAMUSCULAR | Status: AC
  Administered 2020-08-10: 3 mg via SUBCUTANEOUS
  Filled 2020-08-10: qty 1.2

## 2020-08-10 MED ORDER — PROCHLORPERAZINE MALEATE 10 MG PO TABS: 10.0000 mg | ORAL_TABLET | Freq: Once | ORAL | Status: DC

## 2020-08-10 NOTE — Progress Notes (Signed)
Joanna Reid tolerated Velcade injection well without complaints or incident. Labs reviewed prior to administering this medication. VSS Pt discharged self ambulatory in satisfactory condition 

## 2020-08-10 NOTE — Patient Instructions (Signed)
[facility] Discharge Instructions for Patients Receiving Chemotherapy   Beginning January 23rd 2017 lab work for the Cancer Center will be done in the  Main lab at [place] on 1st floor. If you have a lab appointment with the Cancer Center please come in thru the  Main Entrance and check in at the main information desk   Today you received the following chemotherapy agents Velcade. Follow-up as scheduled  To help prevent nausea and vomiting after your treatment, we encourage you to take your nausea medication   If you develop nausea and vomiting, or diarrhea that is not controlled by your medication, call the clinic.  The clinic phone number is [phone]. Office hours are Monday-Friday 8:30am-5:00pm.  BELOW ARE SYMPTOMS THAT SHOULD BE REPORTED IMMEDIATELY:  *FEVER GREATER THAN 101.0 F  *CHILLS WITH OR WITHOUT FEVER  NAUSEA AND VOMITING THAT IS NOT CONTROLLED WITH YOUR NAUSEA MEDICATION  *UNUSUAL SHORTNESS OF BREATH  *UNUSUAL BRUISING OR BLEEDING  TENDERNESS IN MOUTH AND THROAT WITH OR WITHOUT PRESENCE OF ULCERS  *URINARY PROBLEMS  *BOWEL PROBLEMS  UNUSUAL RASH Items with * indicate a potential emergency and should be followed up as soon as possible. If you have an emergency after office hours please contact your primary care physician or go to the nearest emergency department.  Please call the clinic during office hours if you have any questions or concerns.   You may also contact the Patient Navigator at [phone] should you have any questions or need assistance in obtaining follow up care.      Resources For Cancer Patients and their Caregivers ? American Cancer Society: Can assist with transportation, wigs, general needs, runs Look Good Feel Better.        1-888-227-6333 ? Cancer Care: Provides financial assistance, online support groups, medication/co-pay assistance.  1-800-813-HOPE (4673) ? [facility] Assists [place] cancer patients and their families through emotional , educational and financial support.  336-427-4357 ? [place] DSS Where to apply for food stamps, Medicaid and utility assistance. 336-342-1394 ? [facility]: Transportation to medical appointments. 336-347-2287 ? Social Security Administration: May apply for disability if have a Stage IV cancer. 336-342-7796 1-800-772-1213 ? [place] Aging, Disability and Transit Services: Assists with nutrition, care and transit needs. 336-349-2343         

## 2020-08-24 ENCOUNTER — Encounter (HOSPITAL_COMMUNITY): Payer: Self-pay

## 2020-08-24 ENCOUNTER — Inpatient Hospital Stay (HOSPITAL_COMMUNITY): Payer: BC Managed Care – PPO

## 2020-08-24 ENCOUNTER — Other Ambulatory Visit: Payer: Self-pay

## 2020-08-24 VITALS — BP 144/84 | HR 77 | Temp 97.1°F | Resp 18 | Wt 263.8 lb

## 2020-08-24 DIAGNOSIS — C9000 Multiple myeloma not having achieved remission: Secondary | ICD-10-CM

## 2020-08-24 DIAGNOSIS — Z5112 Encounter for antineoplastic immunotherapy: Secondary | ICD-10-CM | POA: Diagnosis not present

## 2020-08-24 LAB — COMPREHENSIVE METABOLIC PANEL
ALT: 15 U/L (ref 0–44)
AST: 20 U/L (ref 15–41)
Albumin: 4 g/dL (ref 3.5–5.0)
Alkaline Phosphatase: 64 U/L (ref 38–126)
Anion gap: 8 (ref 5–15)
BUN: 24 mg/dL — ABNORMAL HIGH (ref 6–20)
CO2: 26 mmol/L (ref 22–32)
Calcium: 9.4 mg/dL (ref 8.9–10.3)
Chloride: 105 mmol/L (ref 98–111)
Creatinine, Ser: 1.04 mg/dL — ABNORMAL HIGH (ref 0.44–1.00)
GFR, Estimated: >60 mL/min (ref >60)
Glucose, Bld: 133 mg/dL — ABNORMAL HIGH (ref 70–99)
Potassium: 3.9 mmol/L (ref 3.5–5.1)
Sodium: 139 mmol/L (ref 135–145)
Total Bilirubin: 0.7 mg/dL (ref 0.3–1.2)
Total Protein: 6.6 g/dL (ref 6.5–8.1)

## 2020-08-24 LAB — CBC WITH DIFFERENTIAL/PLATELET
Abs Immature Granulocytes: 0.01 10*3/uL (ref 0.00–0.07)
Basophils Absolute: 0 10*3/uL (ref 0.0–0.1)
Basophils Relative: 1 %
Eosinophils Absolute: 0 10*3/uL (ref 0.0–0.5)
Eosinophils Relative: 1 %
HCT: 35.3 % — ABNORMAL LOW (ref 36.0–46.0)
Hemoglobin: 11.5 g/dL — ABNORMAL LOW (ref 12.0–15.0)
Immature Granulocytes: 0 %
Lymphocytes Relative: 35 %
Lymphs Abs: 1.2 10*3/uL (ref 0.7–4.0)
MCH: 30.8 pg (ref 26.0–34.0)
MCHC: 32.6 g/dL (ref 30.0–36.0)
MCV: 94.6 fL (ref 80.0–100.0)
Monocytes Absolute: 0.5 10*3/uL (ref 0.1–1.0)
Monocytes Relative: 13 %
Neutro Abs: 1.8 10*3/uL (ref 1.7–7.7)
Neutrophils Relative %: 50 %
Platelets: 194 10*3/uL (ref 150–400)
RBC: 3.73 MIL/uL — ABNORMAL LOW (ref 3.87–5.11)
RDW: 14.6 % (ref 11.5–15.5)
WBC: 3.5 10*3/uL — ABNORMAL LOW (ref 4.0–10.5)
nRBC: 0 % (ref 0.0–0.2)

## 2020-08-24 LAB — MAGNESIUM: Magnesium: 1.8 mg/dL (ref 1.7–2.4)

## 2020-08-24 MED ORDER — PROCHLORPERAZINE MALEATE 10 MG PO TABS: 10.0000 mg | ORAL_TABLET | Freq: Once | ORAL | Status: DC

## 2020-08-24 MED ORDER — BORTEZOMIB CHEMO SQ INJECTION 3.5 MG (2.5MG/ML)
1.3000 mg/m2 | Freq: Once | INTRAMUSCULAR | Status: AC
  Administered 2020-08-24: 3 mg via SUBCUTANEOUS
  Filled 2020-08-24: qty 1.2

## 2020-08-24 MED ORDER — ZOLEDRONIC ACID 4 MG/5ML IV CONC
3.0000 mg | Freq: Once | INTRAVENOUS | Status: AC
  Administered 2020-08-24: 3 mg via INTRAVENOUS
  Filled 2020-08-24: qty 3.75

## 2020-08-24 MED ORDER — SODIUM CHLORIDE 0.9 % IV SOLN: Freq: Once | INTRAVENOUS | Status: AC

## 2020-08-24 NOTE — Progress Notes (Signed)
Tolerated Zometa infusion w/o incident.  Joanna Reid presents today for injection per the provider's orders.  Velcade administration without incident; injection site WNL; see MAR for injection details.  Patient tolerated procedure well and without incident.  No questions or complaints noted at this time. Discharged ambulatory in stable condition.

## 2020-08-30 ENCOUNTER — Ambulatory Visit (INDEPENDENT_AMBULATORY_CARE_PROVIDER_SITE_OTHER): Payer: BC Managed Care – PPO | Admitting: Family Medicine

## 2020-08-30 ENCOUNTER — Encounter: Payer: Self-pay | Admitting: Family Medicine

## 2020-08-30 ENCOUNTER — Other Ambulatory Visit: Payer: Self-pay

## 2020-08-30 VITALS — BP 124/88 | HR 83 | Temp 97.0°F | Ht 64.0 in | Wt 265.0 lb

## 2020-08-30 DIAGNOSIS — H00025 Hordeolum internum left lower eyelid: Secondary | ICD-10-CM | POA: Diagnosis not present

## 2020-08-30 DIAGNOSIS — H00015 Hordeolum externum left lower eyelid: Secondary | ICD-10-CM | POA: Insufficient documentation

## 2020-08-30 MED ORDER — ERYTHROMYCIN 5 MG/GM OP OINT: 1.0000 "application " | TOPICAL_OINTMENT | Freq: Every day | OPHTHALMIC | 0 refills | Status: DC

## 2020-08-30 NOTE — Progress Notes (Signed)
Patient ID: Joanna Reid, female    DOB: 1965/08/21, 55 y.o.   MRN: [id number]   No chief complaint on file.  Subjective:  CC: left eye puffiness  This is a new problem.  Presents today with a complaint of left eye puffiness and pain.  Symptoms started on Sunday.  Reports that the sensitivity and swelling has decreased, notes that there is a pimple-like area inside the left eyelid.  Has tried hot compresses which have helped.  Denies fever, chills, chest pain, shortness of breath.  Reports the left eye had some crustiness and erythema.  Denies vision changes.   pain under left eye. Started 2 days ago.    Medical History [place] has a past medical history of Acid reflux, Allergic rhinitis, Cancer ([place]), Diabetes mellitus, Gout, Gout, HBP (high blood pressure), History of kidney stones, and Migraines.   Outpatient Encounter Medications as of 08/30/2020  Medication Sig  . acyclovir (ZOVIRAX) 400 MG tablet TAKE (1) TABLET BY MOUTH TWICE DAILY.  Marland Kitchen albuterol (PROVENTIL HFA;VENTOLIN HFA) 108 (90 Base) MCG/ACT inhaler Inhale 2 puffs into the lungs every 4 (four) hours as needed for wheezing or shortness of breath.  . allopurinol (ZYLOPRIM) 300 MG tablet TAKE 1 TABLET DAILY  . amLODipine (NORVASC) 5 MG tablet Take 5 mg by mouth daily.  Marland Kitchen aspirin EC 81 MG tablet Take 1 tablet (81 mg total) by mouth daily.  . bumetanide (BUMEX) 0.5 MG tablet Take 0.5 mg by mouth daily.   . carvedilol (COREG) 25 MG tablet Take 1 tablet (25 mg total) by mouth 2 (two) times daily.  . diazepam (VALIUM) 5 MG tablet Take 1 tablet (5 mg total) by mouth at bedtime.  . Docosanol 10 % CREA Apply 1 application topically daily as needed (for nose).   Marland Kitchen docusate sodium (COLACE) 100 MG capsule Take 100 mg by mouth daily.   Marland Kitchen erythromycin ophthalmic ointment Place 1 application into the left eye at bedtime.  . fluticasone (FLONASE) 50 MCG/ACT nasal spray Place 2 sprays into both nostrils daily.  Marland Kitchen losartan (COZAAR) 100 MG  tablet Take 100 mg by mouth daily.  . magnesium oxide (MAG-OX) 400 (241.3 Mg) MG tablet Take 1 tablet (400 mg total) by mouth 3 (three) times daily.  . metFORMIN (GLUCOPHAGE) 500 MG tablet TAKE 1 TABLET BY MOUTH ONCE DAILY WITH BREAKFAST (Patient taking differently: Take 1,000 mg by mouth 2 (two) times daily with a meal.)  . ondansetron (ZOFRAN) 8 MG tablet TAKE 1 TABLET BY MOUTH EVERY 8 HOURS AS NEEDED.  Marland Kitchen pantoprazole (PROTONIX) 40 MG tablet TAKE (1) TABLET BY MOUTH ONCE DAILY.  Marland Kitchen pregabalin (LYRICA) 200 MG capsule TAKE (1) CAPSULE BY MOUTH TWICE DAILY.  Marland Kitchen prochlorperazine (COMPAZINE) 10 MG tablet TAKE 1 TABLET BY MOUTH EVERY 6 HOURS AS NEEDED FOR NAUSEA OR VOMITING.  Marland Kitchen Propylene Glycol (SYSTANE BALANCE) 0.6 % SOLN Apply 1 drop to eye daily as needed (dry eye).   . rosuvastatin (CRESTOR) 10 MG tablet Take 1 tablet (10 mg total) by mouth daily.  . TRULICITY 1.5 XF/8.1WE SOPN Inject 0.75 mg into the skin every Monday.   . valACYclovir (VALTREX) 1000 MG tablet TAKE 2 TABLETS BY MOUTH NOW; THEN 2 12 HOURS LATER.  . [DISCONTINUED] amoxicillin (AMOXIL) 500 MG capsule TAKE 2 CAPSULES NOW; THENN1 CAPSULE 3 TIMES DAILY UNTIL GONE.  . [DISCONTINUED] cephALEXin (KEFLEX) 500 MG capsule Take 1,000 mg by mouth 2 (two) times daily.  . [DISCONTINUED] fluconazole (DIFLUCAN) 100 MG tablet Take by  mouth.  . [DISCONTINUED] methylPREDNISolone (MEDROL DOSEPAK) 4 MG TBPK tablet Take by mouth as directed.  . [DISCONTINUED] nitrofurantoin, macrocrystal-monohydrate, (MACROBID) 100 MG capsule Take one capsule po BID for 7 days   No facility-administered encounter medications on file as of 08/30/2020.     Review of Systems  Constitutional: Negative for chills and fever.  Eyes: Positive for discharge and redness. Negative for photophobia, pain, itching and visual disturbance.       Symptoms conjunctiva with redness and small bump inside lower lid. Woke up one day with crusty discharge on Sunday. Some under eye puffiness  (left eye).   Respiratory: Negative for shortness of breath.   Cardiovascular: Negative for chest pain.  Gastrointestinal: Negative for abdominal pain.  Skin: Negative for rash.  Neurological: Negative for headaches.     Vitals BP 124/88   Pulse 83   Temp (!) 97 F (36.1 C)   Ht 5\' 4"  (1.626 m)   Wt 265 lb (120.2 kg)   SpO2 94%   BMI 45.49 kg/m   Objective:   Physical Exam Vitals reviewed.  Constitutional:      Appearance: Normal appearance.  Eyes:     General: Vision grossly intact.        Left eye: Hordeolum present.No foreign body or discharge.     Comments: Internal hordeolum noted inside left lower lid. Left conjunctivae erythematous.    Cardiovascular:     Rate and Rhythm: Normal rate and regular rhythm.     Heart sounds: Normal heart sounds.  Pulmonary:     Effort: Pulmonary effort is normal.     Breath sounds: Normal breath sounds.  Skin:    General: Skin is warm and dry.  Neurological:     General: No focal deficit present.     Mental Status: She is alert.  Psychiatric:        Behavior: Behavior normal.      Assessment and Plan   1. Hordeolum internum of left lower eyelid - erythromycin ophthalmic ointment; Place 1 application into the left eye at bedtime.  Dispense: 3.5 g; Refill: 0   Hordeolum: Will treat with antibiotic ointment daily.  She is instructed to continue with warm compresses as often as she can.  Agrees with plan of care discussed today. Understands warning signs to seek further care: chest pain, shortness of breath, any significant change in health, eye drainage, vision changes.  Understands to follow-up if symptoms do not improve, or worsen.  Encouraged her to notify her oncology team at her next chemo treatment if symptoms are still present.  Pecolia Ades, NP 08/30/2020

## 2020-08-30 NOTE — Patient Instructions (Signed)

## 2020-09-07 ENCOUNTER — Inpatient Hospital Stay (HOSPITAL_COMMUNITY): Payer: BC Managed Care – PPO

## 2020-09-07 ENCOUNTER — Inpatient Hospital Stay (HOSPITAL_COMMUNITY): Payer: BC Managed Care – PPO | Attending: Hematology

## 2020-09-07 ENCOUNTER — Other Ambulatory Visit: Payer: Self-pay

## 2020-09-07 ENCOUNTER — Encounter (HOSPITAL_COMMUNITY): Payer: Self-pay

## 2020-09-07 VITALS — BP 118/84 | HR 71 | Temp 96.8°F | Resp 18 | Wt 260.6 lb

## 2020-09-07 DIAGNOSIS — C9000 Multiple myeloma not having achieved remission: Secondary | ICD-10-CM | POA: Diagnosis present

## 2020-09-07 DIAGNOSIS — Z5189 Encounter for other specified aftercare: Secondary | ICD-10-CM | POA: Diagnosis not present

## 2020-09-07 DIAGNOSIS — Z298 Encounter for other specified prophylactic measures: Secondary | ICD-10-CM | POA: Insufficient documentation

## 2020-09-07 DIAGNOSIS — Z5112 Encounter for antineoplastic immunotherapy: Secondary | ICD-10-CM | POA: Diagnosis present

## 2020-09-07 DIAGNOSIS — C9001 Multiple myeloma in remission: Secondary | ICD-10-CM

## 2020-09-07 LAB — CBC WITH DIFFERENTIAL/PLATELET
Abs Immature Granulocytes: 0.01 10*3/uL (ref 0.00–0.07)
Basophils Absolute: 0 10*3/uL (ref 0.0–0.1)
Basophils Relative: 1 %
Eosinophils Absolute: 0 10*3/uL (ref 0.0–0.5)
Eosinophils Relative: 0 %
HCT: 36 % (ref 36.0–46.0)
Hemoglobin: 11.5 g/dL — ABNORMAL LOW (ref 12.0–15.0)
Immature Granulocytes: 0 %
Lymphocytes Relative: 29 %
Lymphs Abs: 1.1 10*3/uL (ref 0.7–4.0)
MCH: 30.8 pg (ref 26.0–34.0)
MCHC: 31.9 g/dL (ref 30.0–36.0)
MCV: 96.5 fL (ref 80.0–100.0)
Monocytes Absolute: 0.5 10*3/uL (ref 0.1–1.0)
Monocytes Relative: 12 %
Neutro Abs: 2.3 10*3/uL (ref 1.7–7.7)
Neutrophils Relative %: 58 %
Platelets: 170 10*3/uL (ref 150–400)
RBC: 3.73 MIL/uL — ABNORMAL LOW (ref 3.87–5.11)
RDW: 14.5 % (ref 11.5–15.5)
WBC: 3.9 10*3/uL — ABNORMAL LOW (ref 4.0–10.5)
nRBC: 0 % (ref 0.0–0.2)

## 2020-09-07 LAB — MAGNESIUM: Magnesium: 2 mg/dL (ref 1.7–2.4)

## 2020-09-07 LAB — COMPREHENSIVE METABOLIC PANEL
ALT: 15 U/L (ref 0–44)
AST: 19 U/L (ref 15–41)
Albumin: 4 g/dL (ref 3.5–5.0)
Alkaline Phosphatase: 57 U/L (ref 38–126)
Anion gap: 11 (ref 5–15)
BUN: 27 mg/dL — ABNORMAL HIGH (ref 6–20)
CO2: 24 mmol/L (ref 22–32)
Calcium: 8.9 mg/dL (ref 8.9–10.3)
Chloride: 105 mmol/L (ref 98–111)
Creatinine, Ser: 1.08 mg/dL — ABNORMAL HIGH (ref 0.44–1.00)
GFR, Estimated: >60 mL/min (ref >60)
Glucose, Bld: 123 mg/dL — ABNORMAL HIGH (ref 70–99)
Potassium: 3.7 mmol/L (ref 3.5–5.1)
Sodium: 140 mmol/L (ref 135–145)
Total Bilirubin: 0.8 mg/dL (ref 0.3–1.2)
Total Protein: 6.9 g/dL (ref 6.5–8.1)

## 2020-09-07 MED ORDER — PROCHLORPERAZINE MALEATE 10 MG PO TABS: 10.0000 mg | ORAL_TABLET | Freq: Once | ORAL | Status: DC

## 2020-09-07 MED ORDER — BORTEZOMIB CHEMO SQ INJECTION 3.5 MG (2.5MG/ML)
1.3000 mg/m2 | Freq: Once | INTRAMUSCULAR | Status: AC
  Administered 2020-09-07: 3 mg via SUBCUTANEOUS
  Filled 2020-09-07: qty 1.2

## 2020-09-07 NOTE — Progress Notes (Signed)
Patient tolerated Velcade injection with no complaints voiced.  Lab work reviewed.  See MAR for details.  Injection site clean and dry with no bruising or swelling noted.  Band aid applied.  VSS.  Patient left in satisfactory condition with no s/s of distress noted.

## 2020-09-08 LAB — KAPPA/LAMBDA LIGHT CHAINS
Kappa free light chain: 19.9 mg/L — ABNORMAL HIGH (ref 3.3–19.4)
Kappa, lambda light chain ratio: 1.42 (ref 0.26–1.65)
Lambda free light chains: 14 mg/L (ref 5.7–26.3)

## 2020-09-09 ENCOUNTER — Other Ambulatory Visit (HOSPITAL_COMMUNITY): Payer: Self-pay | Admitting: Hematology

## 2020-09-09 DIAGNOSIS — C9000 Multiple myeloma not having achieved remission: Secondary | ICD-10-CM

## 2020-09-09 LAB — PROTEIN ELECTROPHORESIS, SERUM
A/G Ratio: 1.5 (ref 0.7–1.7)
Albumin ELP: 3.7 g/dL (ref 2.9–4.4)
Alpha-1-Globulin: 0.2 g/dL (ref 0.0–0.4)
Alpha-2-Globulin: 0.7 g/dL (ref 0.4–1.0)
Beta Globulin: 0.9 g/dL (ref 0.7–1.3)
Gamma Globulin: 0.6 g/dL (ref 0.4–1.8)
Globulin, Total: 2.5 g/dL (ref 2.2–3.9)
Total Protein ELP: 6.2 g/dL (ref 6.0–8.5)

## 2020-09-10 LAB — IMMUNOFIXATION ELECTROPHORESIS
IgA: 83 mg/dL — ABNORMAL LOW (ref 87–352)
IgG (Immunoglobin G), Serum: 723 mg/dL (ref 586–1602)
IgM (Immunoglobulin M), Srm: 19 mg/dL — ABNORMAL LOW (ref 26–217)
Total Protein ELP: 6.5 g/dL (ref 6.0–8.5)

## 2020-09-14 ENCOUNTER — Other Ambulatory Visit: Payer: Self-pay

## 2020-09-14 ENCOUNTER — Telehealth: Payer: Self-pay | Admitting: Emergency Medicine

## 2020-09-14 ENCOUNTER — Ambulatory Visit
Admission: EM | Admit: 2020-09-14 | Discharge: 2020-09-14 | Disposition: A | Discharge status: Home / Self Care | Payer: BC Managed Care – PPO | Attending: Emergency Medicine | Admitting: Emergency Medicine

## 2020-09-14 ENCOUNTER — Encounter: Payer: Self-pay | Admitting: Emergency Medicine

## 2020-09-14 DIAGNOSIS — R300 Dysuria: Secondary | ICD-10-CM | POA: Diagnosis present

## 2020-09-14 DIAGNOSIS — S30861A Insect bite (nonvenomous) of abdominal wall, initial encounter: Secondary | ICD-10-CM

## 2020-09-14 DIAGNOSIS — Z87891 Personal history of nicotine dependence: Secondary | ICD-10-CM | POA: Diagnosis not present

## 2020-09-14 DIAGNOSIS — W57XXXA Bitten or stung by nonvenomous insect and other nonvenomous arthropods, initial encounter: Secondary | ICD-10-CM | POA: Insufficient documentation

## 2020-09-14 DIAGNOSIS — N898 Other specified noninflammatory disorders of vagina: Secondary | ICD-10-CM

## 2020-09-14 LAB — POCT URINALYSIS DIP (MANUAL ENTRY)
Bilirubin, UA: NEGATIVE
Blood, UA: NEGATIVE
Glucose, UA: NEGATIVE mg/dL
Ketones, POC UA: NEGATIVE mg/dL
Leukocytes, UA: NEGATIVE
Nitrite, UA: NEGATIVE
Protein Ur, POC: NEGATIVE mg/dL
Spec Grav, UA: 1.015 (ref 1.010–1.025)
Urobilinogen, UA: 0.2 E.U./dL
pH, UA: 6 (ref 5.0–8.0)

## 2020-09-14 MED ORDER — FLUCONAZOLE 150 MG PO TABS: 150.0000 mg | ORAL_TABLET | Freq: Once | ORAL | 0 refills | Status: AC

## 2020-09-14 MED ORDER — DOXYCYCLINE HYCLATE 100 MG PO CAPS: 100.0000 mg | ORAL_CAPSULE | Freq: Two times a day (BID) | ORAL | 0 refills | Status: DC

## 2020-09-14 MED ORDER — METRONIDAZOLE 500 MG PO TABS: 500.0000 mg | ORAL_TABLET | Freq: Two times a day (BID) | ORAL | 0 refills | Status: DC

## 2020-09-14 NOTE — ED Triage Notes (Addendum)
Tick bite under RT axilla last night, had difficult time removing tick.  Also c/o poss uti, burning with urination,  Odor and increased vaginal discharge.

## 2020-09-14 NOTE — ED Provider Notes (Signed)
[facility]   CC: Burning with urination; tick bite  SUBJECTIVE:  Joanna Reid is a 55 y.o. female who complains of dysuria, thin clear vaginal discharge and odor x 2 weeks.  Patient denies a precipitating event.  Admits to reoccuring UTIs. Is followed by urology.  Seen last in January.  Denies pain to abdomen or back.  Denies alleviating or aggravating factors.  Admits to similar symptoms in the past.  Denies fever, chills, nausea, vomiting, abdominal pain, flank pain, abnormal vaginal discharge or bleeding, hematuria.    Tick bite to RT axilla that she removed last night.  Speculates it may have been there for over 48 hours.  Was doing yard work this past weekend. Reports previous hx of tick bite.  Denies fever, chills, nausea, vomiting, headache, dizziness, weakness, fatigue, rash, or abdominal pain.    LMP: No LMP recorded. Patient has had a hysterectomy.  ROS: As in HPI.  All other pertinent ROS negative.     Past Medical History:  Diagnosis Date  . Acid reflux   . Allergic rhinitis   . Cancer ([place])    multiple myeloma  . Diabetes mellitus    type 2  . Gout   . Gout   . HBP (high blood pressure)   . History of kidney stones   . Migraines    Past Surgical History:  Procedure Laterality Date  . BREAST CYST EXCISION Left    2009 no visible scar on skin  . CESAREAN SECTION    . COLONOSCOPY WITH PROPOFOL N/A 12/25/2019   Procedure: COLONOSCOPY WITH PROPOFOL;  Surgeon: Rogene Houston, MD;  Location: [facility];  Service: Endoscopy;  Laterality: N/A;  730  . EXTRACORPOREAL SHOCK WAVE LITHOTRIPSY Left 10/10/2017   Procedure: LEFT EXTRACORPOREAL SHOCK WAVE LITHOTRIPSY (ESWL);  Surgeon: Bjorn Loser, MD;  Location: [facility];  Service: Urology;  Laterality: Left;  . EYE SURGERY    . HEMORRHOID SURGERY N/A 11/19/2012   Procedure: HEMORRHOIDECTOMY;  Surgeon: Jamesetta So, MD;  Location: [facility];  Service: General;  Laterality: N/A;  . kidney stones  1998  .  LAPAROSCOPIC UNILATERAL SALPINGO OOPHERECTOMY  05/14/2012   Procedure: LAPAROSCOPIC UNILATERAL SALPINGO OOPHORECTOMY;  Surgeon: [name], MD;  Location: [facility];  Service: Gynecology;  Laterality: Right;  laparoscopic right salpingo-oophorectomy  . PARTIAL HYSTERECTOMY    . POLYPECTOMY  12/25/2019   Procedure: POLYPECTOMY;  Surgeon: Rogene Houston, MD;  Location: [facility];  Service: Endoscopy;;  . TONSILECTOMY, ADENOIDECTOMY, BILATERAL MYRINGOTOMY AND TUBES    . VESICOVAGINAL FISTULA CLOSURE W/ TAH     No Known Allergies No current facility-administered medications on file prior to encounter.   Current Outpatient Medications on File Prior to Encounter  Medication Sig Dispense Refill  . acyclovir (ZOVIRAX) 400 MG tablet TAKE (1) TABLET BY MOUTH TWICE DAILY. 60 tablet 3  . albuterol (PROVENTIL HFA;VENTOLIN HFA) 108 (90 Base) MCG/ACT inhaler Inhale 2 puffs into the lungs every 4 (four) hours as needed for wheezing or shortness of breath. 1 Inhaler 3  . allopurinol (ZYLOPRIM) 300 MG tablet TAKE 1 TABLET DAILY 90 tablet 0  . amLODipine (NORVASC) 5 MG tablet Take 5 mg by mouth daily.    Marland Kitchen aspirin EC 81 MG tablet Take 1 tablet (81 mg total) by mouth daily. 30 tablet 11  . bumetanide (BUMEX) 0.5 MG tablet Take 0.5 mg by mouth daily.     . carvedilol (COREG) 25 MG tablet Take 1 tablet (25  mg total) by mouth 2 (two) times daily.    . diazepam (VALIUM) 5 MG tablet Take 1 tablet (5 mg total) by mouth at bedtime. 30 tablet 2  . Docosanol 10 % CREA Apply 1 application topically daily as needed (for nose).     Marland Kitchen docusate sodium (COLACE) 100 MG capsule Take 100 mg by mouth daily.     Marland Kitchen erythromycin ophthalmic ointment Place 1 application into the left eye at bedtime. 3.5 g 0  . fluticasone (FLONASE) 50 MCG/ACT nasal spray Place 2 sprays into both nostrils daily. 16 g 0  . losartan (COZAAR) 100 MG tablet Take 100 mg by mouth daily.    . magnesium oxide (MAG-OX) 400 (241.3 Mg) MG tablet Take 1  tablet (400 mg total) by mouth 3 (three) times daily. 90 tablet 3  . metFORMIN (GLUCOPHAGE) 500 MG tablet TAKE 1 TABLET BY MOUTH ONCE DAILY WITH BREAKFAST (Patient taking differently: Take 1,000 mg by mouth 2 (two) times daily with a meal.) 90 tablet 0  . ondansetron (ZOFRAN) 8 MG tablet TAKE 1 TABLET BY MOUTH EVERY 8 HOURS AS NEEDED. 30 tablet 6  . pantoprazole (PROTONIX) 40 MG tablet TAKE (1) TABLET BY MOUTH ONCE DAILY. 90 tablet 0  . pregabalin (LYRICA) 200 MG capsule TAKE (1) CAPSULE BY MOUTH TWICE DAILY. 60 capsule 6  . prochlorperazine (COMPAZINE) 10 MG tablet TAKE 1 TABLET BY MOUTH EVERY 6 HOURS AS NEEDED FOR NAUSEA OR VOMITING. 30 tablet 6  . Propylene Glycol (SYSTANE BALANCE) 0.6 % SOLN Apply 1 drop to eye daily as needed (dry eye).     . rosuvastatin (CRESTOR) 10 MG tablet Take 1 tablet (10 mg total) by mouth daily. 30 tablet 0  . TRULICITY 1.5 VO/1.6WV SOPN Inject 0.75 mg into the skin every Monday.     . valACYclovir (VALTREX) 1000 MG tablet TAKE 2 TABLETS BY MOUTH NOW; THEN 2 12 HOURS LATER. 12 tablet 6   Social History   Socioeconomic History  . Marital status: Married    Spouse name: Not on file  . Number of children: Not on file  . Years of education: 41  . Highest education level: Not on file  Occupational History    Employer: [employer]  Tobacco Use  . Smoking status: Former Smoker    Quit date: 02/27/1999    Years since quitting: 21.5  . Smokeless tobacco: Never Used  . Tobacco comment: socially   Vaping Use  . Vaping Use: Never used  Substance and Sexual Activity  . Alcohol use: No    Alcohol/week: 0.0 standard drinks  . Drug use: No  . Sexual activity: Yes  Other Topics Concern  . Not on file  Social History Narrative  . Not on file   Social Determinants of Health   Financial Resource Strain: Low Risk   . Difficulty of Paying Living Expenses: Not hard at all  Food Insecurity: No Food Insecurity  . Worried About Charity fundraiser in the Last Year: Never  true  . Ran Out of Food in the Last Year: Never true  Transportation Needs: No Transportation Needs  . Lack of Transportation (Medical): No  . Lack of Transportation (Non-Medical): No  Physical Activity: Insufficiently Active  . Days of Exercise per Week: 7 days  . Minutes of Exercise per Session: 10 min  Stress: Not on file  Social Connections: Socially Integrated  . Frequency of Communication with Friends and Family: More than three times a week  . Frequency  of Social Gatherings with Friends and Family: More than three times a week  . Attends Religious Services: More than 4 times per year  . Active Member of Clubs or Organizations: Yes  . Attends Archivist Meetings: More than 4 times per year  . Marital Status: Married  Human resources officer Violence: Not At Risk  . Fear of Current or Ex-Partner: No  . Emotionally Abused: No  . Physically Abused: No  . Sexually Abused: No   Family History  Problem Relation Age of Onset  . Arthritis Other   . Cancer Other   . Diabetes Other   . Hypertension Mother   . Dementia Mother   . Diabetes Father   . ALS Father   . Diabetes Brother   . Hypertension Brother   . Cancer Paternal Aunt   . COPD Maternal Grandmother   . Cancer Maternal Grandfather   . Anesthesia problems Paternal Grandfather     OBJECTIVE:  Vitals:   09/14/20 1108  BP: 131/82  Pulse: 70  Resp: 19  Temp: 98 F (36.7 C)  TempSrc: Oral  SpO2: 96%   General appearance: Alert in no acute distress HEENT: NCAT.  Oropharynx clear.  Lungs: clear to auscultation bilaterally without adventitious breath sounds Heart: regular rate and rhythm.   Abdomen: soft; non-distended; no tenderness; bowel sounds present; no guarding Back: no CVA tenderness Extremities: no edema; symmetrical with no gross deformities Skin: warm and dry; punctate lesion to LT axilla with surrounding erythema, mildly TTP no obvious drainage or bleeding Neurologic: Ambulates from chair to exam  table without difficulty Psychological: alert and cooperative; normal mood and affect  Labs Reviewed  POCT URINALYSIS DIP (MANUAL ENTRY)  CERVICOVAGINAL ANCILLARY ONLY    ASSESSMENT & PLAN:  1. Tick bite of abdominal wall, initial encounter   2. Dysuria   3. Vaginal discharge     Meds ordered this encounter  Medications  . doxycycline (VIBRAMYCIN) 100 MG capsule    Sig: Take 1 capsule (100 mg total) by mouth 2 (two) times daily.    Dispense:  20 capsule    Refill:  0    Order Specific Question:   Supervising Provider    Answer:   Raylene Everts [[id number]]  . metroNIDAZOLE (FLAGYL) 500 MG tablet    Sig: Take 1 tablet (500 mg total) by mouth 2 (two) times daily.    Dispense:  14 tablet    Refill:  0    Order Specific Question:   Supervising Provider    Answer:   Raylene Everts [[id number]]   UTI symptoms/ vaginal discharge:  Push fluids and get plenty of rest.   Urine without signs of infection Vaginal swab obtained.  We will call you with abnormal results Metronidazole/ flagyl prescribed.   Follow up with PCP if symptoms persists Return here or go to ER if you have any new or worsening symptoms such as fever, worsening abdominal pain, nausea/vomiting, flank pain, etc...  Tick bite:  Prescribed doxycycline To prevent tick bites, wear long sleeves, long pants, and light colors. Use insect repellent. Follow the instructions on the bottle. If the tick is biting, do not try to remove it with heat, alcohol, petroleum jelly, or fingernail polish. Use tweezers, curved forceps, or a tick-removal tool to grasp the tick. Gently pull up until the tick lets go. Do not twist or jerk the tick. Do not squeeze or crush the tick. Return here or go to ER if you have any new  or worsening symptoms (rash, nausea, vomiting, fever, chills, headache, fatigue)   Outlined signs and symptoms indicating need for more acute intervention. Patient verbalized understanding. After Visit Summary  given.     Lestine Box, PA-C 09/14/20 1147

## 2020-09-14 NOTE — Discharge Instructions (Signed)
UTI symptoms/ vaginal discharge:  Push fluids and get plenty of rest.   Urine without signs of infection Vaginal swab obtained.  We will call you with abnormal results Metronidazole/ flagyl prescribed.   Follow up with PCP if symptoms persists Return here or go to ER if you have any new or worsening symptoms such as fever, worsening abdominal pain, nausea/vomiting, flank pain, etc...  Tick bite:  Prescribed doxycycline To prevent tick bites, wear long sleeves, long pants, and light colors. Use insect repellent. Follow the instructions on the bottle. If the tick is biting, do not try to remove it with heat, alcohol, petroleum jelly, or fingernail polish. Use tweezers, curved forceps, or a tick-removal tool to grasp the tick. Gently pull up until the tick lets go. Do not twist or jerk the tick. Do not squeeze or crush the tick. Return here or go to ER if you have any new or worsening symptoms (rash, nausea, vomiting, fever, chills, headache, fatigue)

## 2020-09-15 ENCOUNTER — Telehealth: Payer: Self-pay | Admitting: Adult Health

## 2020-09-15 LAB — CERVICOVAGINAL ANCILLARY ONLY
Bacterial Vaginitis (gardnerella): POSITIVE — AB
Candida Glabrata: NEGATIVE
Candida Vaginitis: NEGATIVE
Chlamydia: NEGATIVE
Comment: NEGATIVE
Comment: NEGATIVE
Comment: NEGATIVE
Comment: NEGATIVE
Comment: NEGATIVE
Comment: NORMAL
Neisseria Gonorrhea: NEGATIVE
Trichomonas: NEGATIVE

## 2020-09-15 NOTE — Telephone Encounter (Signed)
Called and LMOM to discuss Evusheld and administration during her 10/05/2020 appointment.    Wilber Bihari, NP

## 2020-09-17 ENCOUNTER — Other Ambulatory Visit: Payer: Self-pay | Admitting: Physician Assistant

## 2020-09-17 DIAGNOSIS — C9000 Multiple myeloma not having achieved remission: Secondary | ICD-10-CM

## 2020-09-17 NOTE — Progress Notes (Signed)
I connected by phone with Joanna Reid on 09/17/2020, 10:14 AM to discuss the potential use of a new treatment, tixagevimab/cilgavimab, for pre-exposure prophylaxis for prevention of coronavirus disease 2019 (COVID-19) caused by the SARS-CoV-2 virus.  This patient is a 55 y.o. female that meets the FDA criteria for Emergency Use Authorization of tixagevimab/cilgavimab for pre-exposure prophylaxis of COVID-19 disease. Pt meets following criteria:  Age >12 yr and weight > 40kg  Not currently infected with SARS-CoV-2 and has no known recent exposure to an individual infected with SARS-CoV-2 AND o Who has moderate to severe immune compromise due to a medical condition or receipt of immunosuppressive medications or treatments and may not mount an adequate immune response to COVID-19 vaccination or  o Vaccination with any available COVID-19 vaccine, according to the approved or authorized schedule, is not recommended due to a history of severe adverse reaction (e.g., severe allergic reaction) to a COVID-19 vaccine(s) and/or COVID-19 vaccine component(s).  o Patient meets the following definition of mod-severe immune compromised status: 7. Solid tumor malignancies on immunomodulatory chemotherapy or advanced AIDS   I have spoken and communicated the following to the patient or parent/caregiver regarding COVID monoclonal antibody treatment:  1. FDA has authorized the emergency use of tixagevimab/cilgavimab for the pre-exposure prophylaxis of COVID-19 in patients with moderate-severe immunocompromised status, who meet above EUA criteria.  2. The significant known and potential risks and benefits of COVID monoclonal antibody, and the extent to which such potential risks and benefits are unknown.  3. Information on available alternative treatments and the risks and benefits of those alternatives, including clinical trials.  4. The patient or parent/caregiver has the option to accept or refuse COVID  monoclonal antibody treatment.  After reviewing this information with the patient, agree to receive tixagevimab/cilgavimab   Pt is set up for Evusheld on 3/30 @1 :30am when she gets her normal infusion of Zometa and Velcade  [name], PA-C, 09/17/2020, 10:14 AM

## 2020-09-21 ENCOUNTER — Other Ambulatory Visit: Payer: Self-pay

## 2020-09-21 ENCOUNTER — Inpatient Hospital Stay (HOSPITAL_COMMUNITY): Payer: BC Managed Care – PPO

## 2020-09-21 ENCOUNTER — Encounter (HOSPITAL_COMMUNITY): Payer: Self-pay

## 2020-09-21 VITALS — BP 118/87 | HR 72 | Temp 97.0°F | Resp 18 | Wt 263.0 lb

## 2020-09-21 DIAGNOSIS — C9000 Multiple myeloma not having achieved remission: Secondary | ICD-10-CM

## 2020-09-21 DIAGNOSIS — Z5112 Encounter for antineoplastic immunotherapy: Secondary | ICD-10-CM | POA: Diagnosis not present

## 2020-09-21 LAB — CBC WITH DIFFERENTIAL/PLATELET
Abs Immature Granulocytes: 0.01 10*3/uL (ref 0.00–0.07)
Basophils Absolute: 0 10*3/uL (ref 0.0–0.1)
Basophils Relative: 1 %
Eosinophils Absolute: 0 10*3/uL (ref 0.0–0.5)
Eosinophils Relative: 1 %
HCT: 36.2 % (ref 36.0–46.0)
Hemoglobin: 11.6 g/dL — ABNORMAL LOW (ref 12.0–15.0)
Immature Granulocytes: 0 %
Lymphocytes Relative: 36 %
Lymphs Abs: 1.4 10*3/uL (ref 0.7–4.0)
MCH: 30.4 pg (ref 26.0–34.0)
MCHC: 32 g/dL (ref 30.0–36.0)
MCV: 95 fL (ref 80.0–100.0)
Monocytes Absolute: 0.5 10*3/uL (ref 0.1–1.0)
Monocytes Relative: 13 %
Neutro Abs: 1.9 10*3/uL (ref 1.7–7.7)
Neutrophils Relative %: 49 %
Platelets: 187 10*3/uL (ref 150–400)
RBC: 3.81 MIL/uL — ABNORMAL LOW (ref 3.87–5.11)
RDW: 14.6 % (ref 11.5–15.5)
WBC: 3.8 10*3/uL — ABNORMAL LOW (ref 4.0–10.5)
nRBC: 0 % (ref 0.0–0.2)

## 2020-09-21 LAB — COMPREHENSIVE METABOLIC PANEL
ALT: 21 U/L (ref 0–44)
AST: 25 U/L (ref 15–41)
Albumin: 4 g/dL (ref 3.5–5.0)
Alkaline Phosphatase: 57 U/L (ref 38–126)
Anion gap: 10 (ref 5–15)
BUN: 21 mg/dL — ABNORMAL HIGH (ref 6–20)
CO2: 24 mmol/L (ref 22–32)
Calcium: 9.2 mg/dL (ref 8.9–10.3)
Chloride: 106 mmol/L (ref 98–111)
Creatinine, Ser: 0.93 mg/dL (ref 0.44–1.00)
GFR, Estimated: >60 mL/min (ref >60)
Glucose, Bld: 107 mg/dL — ABNORMAL HIGH (ref 70–99)
Potassium: 4 mmol/L (ref 3.5–5.1)
Sodium: 140 mmol/L (ref 135–145)
Total Bilirubin: 0.6 mg/dL (ref 0.3–1.2)
Total Protein: 6.8 g/dL (ref 6.5–8.1)

## 2020-09-21 LAB — MAGNESIUM: Magnesium: 2 mg/dL (ref 1.7–2.4)

## 2020-09-21 MED ORDER — BORTEZOMIB CHEMO SQ INJECTION 3.5 MG (2.5MG/ML)
1.3000 mg/m2 | Freq: Once | INTRAMUSCULAR | Status: AC
  Administered 2020-09-21: 3 mg via SUBCUTANEOUS
  Filled 2020-09-21: qty 1.2

## 2020-09-21 MED ORDER — PROCHLORPERAZINE MALEATE 10 MG PO TABS: 10.0000 mg | ORAL_TABLET | Freq: Once | ORAL | Status: DC

## 2020-09-21 NOTE — Progress Notes (Signed)
Patient tolerated Velcade injection with no complaints voiced. Lab work reviewed. See MAR for details. Injection site clean and dry with no bruising or swelling noted. Patient stable during and after injection. Band aid applied. VSS. Patient left in satisfactory condition with no s/s of distress noted. 

## 2020-09-21 NOTE — Patient Instructions (Signed)
[facility]  Discharge Instructions:   _______________________________________________________________  Thank you for choosing [facility] to provide your oncology and hematology care.  To afford each patient quality time with our providers, please arrive at least 15 minutes before your scheduled appointment.  You need to re-schedule your appointment if you arrive 10 or more minutes late.  We strive to give you quality time with our providers, and arriving late affects you and other patients whose appointments are after yours.  Also, if you no show three or more times for appointments you may be dismissed from the clinic.  Again, thank you for choosing [facility]. Our hope is that these requests will allow you access to exceptional care and in a timely manner. _______________________________________________________________  If you have questions after your visit, please contact our office at [phone] between the hours of 8:30 a.m. and 5:00 p.m. Voicemails left after 4:30 p.m. will not be returned until the following business day. _______________________________________________________________  For prescription refill requests, have your pharmacy contact our office. _______________________________________________________________  Recommendations made by the consultant and any test results will be sent to your referring physician. _______________________________________________________________ 

## 2020-10-05 ENCOUNTER — Other Ambulatory Visit: Payer: Self-pay

## 2020-10-05 ENCOUNTER — Inpatient Hospital Stay (HOSPITAL_BASED_OUTPATIENT_CLINIC_OR_DEPARTMENT_OTHER): Payer: BC Managed Care – PPO | Admitting: Hematology

## 2020-10-05 ENCOUNTER — Inpatient Hospital Stay (HOSPITAL_COMMUNITY): Payer: BC Managed Care – PPO

## 2020-10-05 VITALS — BP 135/91 | HR 71 | Temp 97.0°F | Resp 18

## 2020-10-05 VITALS — BP 124/83 | HR 74 | Temp 96.9°F | Resp 20 | Wt 258.6 lb

## 2020-10-05 DIAGNOSIS — C9000 Multiple myeloma not having achieved remission: Secondary | ICD-10-CM

## 2020-10-05 DIAGNOSIS — Z5112 Encounter for antineoplastic immunotherapy: Secondary | ICD-10-CM | POA: Diagnosis not present

## 2020-10-05 LAB — COMPREHENSIVE METABOLIC PANEL
ALT: 14 U/L (ref 0–44)
AST: 20 U/L (ref 15–41)
Albumin: 4 g/dL (ref 3.5–5.0)
Alkaline Phosphatase: 54 U/L (ref 38–126)
Anion gap: 9 (ref 5–15)
BUN: 27 mg/dL — ABNORMAL HIGH (ref 6–20)
CO2: 26 mmol/L (ref 22–32)
Calcium: 9.4 mg/dL (ref 8.9–10.3)
Chloride: 108 mmol/L (ref 98–111)
Creatinine, Ser: 1.09 mg/dL — ABNORMAL HIGH (ref 0.44–1.00)
GFR, Estimated: 60 mL/min — ABNORMAL LOW (ref >60)
Glucose, Bld: 117 mg/dL — ABNORMAL HIGH (ref 70–99)
Potassium: 3.7 mmol/L (ref 3.5–5.1)
Sodium: 143 mmol/L (ref 135–145)
Total Bilirubin: 0.5 mg/dL (ref 0.3–1.2)
Total Protein: 6.8 g/dL (ref 6.5–8.1)

## 2020-10-05 LAB — CBC WITH DIFFERENTIAL/PLATELET
Abs Immature Granulocytes: 0.01 10*3/uL (ref 0.00–0.07)
Basophils Absolute: 0 10*3/uL (ref 0.0–0.1)
Basophils Relative: 1 %
Eosinophils Absolute: 0 10*3/uL (ref 0.0–0.5)
Eosinophils Relative: 1 %
HCT: 37.6 % (ref 36.0–46.0)
Hemoglobin: 12.1 g/dL (ref 12.0–15.0)
Immature Granulocytes: 0 %
Lymphocytes Relative: 35 %
Lymphs Abs: 1.5 10*3/uL (ref 0.7–4.0)
MCH: 31.3 pg (ref 26.0–34.0)
MCHC: 32.2 g/dL (ref 30.0–36.0)
MCV: 97.2 fL (ref 80.0–100.0)
Monocytes Absolute: 0.5 10*3/uL (ref 0.1–1.0)
Monocytes Relative: 12 %
Neutro Abs: 2.1 10*3/uL (ref 1.7–7.7)
Neutrophils Relative %: 51 %
Platelets: 175 10*3/uL (ref 150–400)
RBC: 3.87 MIL/uL (ref 3.87–5.11)
RDW: 14.8 % (ref 11.5–15.5)
WBC: 4.1 10*3/uL (ref 4.0–10.5)
nRBC: 0 % (ref 0.0–0.2)

## 2020-10-05 LAB — MAGNESIUM: Magnesium: 1.8 mg/dL (ref 1.7–2.4)

## 2020-10-05 MED ORDER — ALBUTEROL SULFATE HFA 108 (90 BASE) MCG/ACT IN AERS: 2.0000 | INHALATION_SPRAY | Freq: Once | RESPIRATORY_TRACT | Status: DC | PRN

## 2020-10-05 MED ORDER — EPINEPHRINE 0.3 MG/0.3ML IJ SOAJ
0.3000 mg | Freq: Once | INTRAMUSCULAR | Status: DC | PRN
  Filled 2020-10-05: qty 0.6

## 2020-10-05 MED ORDER — TIXAGEVIMAB (PART OF EVUSHELD) INJECTION
300.0000 mg | Freq: Once | INTRAMUSCULAR | Status: AC
  Administered 2020-10-05: 300 mg via INTRAMUSCULAR
  Filled 2020-10-05: qty 3

## 2020-10-05 MED ORDER — DIPHENHYDRAMINE HCL 50 MG/ML IJ SOLN: 50.0000 mg | Freq: Once | INTRAMUSCULAR | Status: DC | PRN

## 2020-10-05 MED ORDER — METHYLPREDNISOLONE SODIUM SUCC 125 MG IJ SOLR: 125.0000 mg | Freq: Once | INTRAMUSCULAR | Status: DC | PRN

## 2020-10-05 MED ORDER — PROCHLORPERAZINE MALEATE 10 MG PO TABS
10.0000 mg | ORAL_TABLET | Freq: Once | ORAL | Status: AC
  Administered 2020-10-05: 10 mg via ORAL
  Filled 2020-10-05: qty 1

## 2020-10-05 MED ORDER — BORTEZOMIB CHEMO SQ INJECTION 3.5 MG (2.5MG/ML)
1.3000 mg/m2 | Freq: Once | INTRAMUSCULAR | Status: AC
  Administered 2020-10-05: 3 mg via SUBCUTANEOUS
  Filled 2020-10-05: qty 1.2

## 2020-10-05 MED ORDER — CILGAVIMAB (PART OF EVUSHELD) INJECTION
300.0000 mg | Freq: Once | INTRAMUSCULAR | Status: AC
  Administered 2020-10-05: 300 mg via INTRAMUSCULAR
  Filled 2020-10-05: qty 3

## 2020-10-05 NOTE — Patient Instructions (Signed)
[facility] Discharge Instructions for Patients Receiving Chemotherapy  Today you received the following chemotherapy agents Velcade and evusheld given today.  Follow up as scheduled.  Please call the clinic if you have any questions or concerns.   To help prevent nausea and vomiting after your treatment, we encourage you to take your nausea medication    If you develop nausea and vomiting that is not controlled by your nausea medication, call the clinic.   BELOW ARE SYMPTOMS THAT SHOULD BE REPORTED IMMEDIATELY:  *FEVER GREATER THAN 100.5 F  *CHILLS WITH OR WITHOUT FEVER  NAUSEA AND VOMITING THAT IS NOT CONTROLLED WITH YOUR NAUSEA MEDICATION  *UNUSUAL SHORTNESS OF BREATH  *UNUSUAL BRUISING OR BLEEDING  TENDERNESS IN MOUTH AND THROAT WITH OR WITHOUT PRESENCE OF ULCERS  *URINARY PROBLEMS  *BOWEL PROBLEMS  UNUSUAL RASH Items with * indicate a potential emergency and should be followed up as soon as possible.  Feel free to call the clinic should you have any questions or concerns. The clinic phone number is [phone].  Please show the [place] at check-in to the Emergency Department and triage nurse.

## 2020-10-05 NOTE — Patient Instructions (Signed)
[place] at [facility] Discharge Instructions  You were seen today by Dr. Delton Coombes. He went over your recent results. You received your treatment and Evusheld injection today; continue getting your treatment every 2 weeks. Dr. Delton Coombes will see you back in 3 months for labs and follow up.   Thank you for choosing [place] at [facility] to provide your oncology and hematology care.  To afford each patient quality time with our provider, please arrive at least 15 minutes before your scheduled appointment time.   If you have a lab appointment with the [facility] please come in thru the Main Entrance and check in at the main information desk  You need to re-schedule your appointment should you arrive 10 or more minutes late.  We strive to give you quality time with our providers, and arriving late affects you and other patients whose appointments are after yours.  Also, if you no show three or more times for appointments you may be dismissed from the clinic at the providers discretion.     Again, thank you for choosing [facility].  Our hope is that these requests will decrease the amount of time that you wait before being seen by our physicians.       _____________________________________________________________  Should you have questions after your visit to [facility], please contact our office at [phone] between the hours of 8:00 a.m. and 4:30 p.m.  Voicemails left after 4:00 p.m. will not be returned until the following business day.  For prescription refill requests, have your pharmacy contact our office and allow 72 hours.    Cancer Center Support Programs:   > Cancer Support Group  2nd Tuesday of the month 1pm-2pm, Journey Room

## 2020-10-05 NOTE — Progress Notes (Signed)
[address] [place], [place] [address]   CLINIC:  Medical Oncology/Hematology  PCP:  [name], DO [address] / [place] [address] [phone]   REASON FOR VISIT:  Follow-up for multiple myeloma  PRIOR THERAPY:  1. RVD x 4 cycles from 11/01/2017 through 01/14/2018. 2. Stem cell transplant on 02/20/2018. 3. Velcade from 06/11/2018 to 12/22/2019.  NGS Results: Not done  CURRENT THERAPY: Velcade every 2 weeks  BRIEF ONCOLOGIC HISTORY:  Oncology History  Multiple myeloma ([place])  10/29/2017 Initial Diagnosis   Multiple myeloma ([place])   11/01/2017 - 01/24/2018 Chemotherapy   The patient had dexamethasone (DECADRON) 4 MG tablet, 1 of 1 cycle, Start date: --, End date: -- lenalidomide (REVLIMID) 25 MG capsule, 1 of 1 cycle, Start date: --, End date: -- bortezomib SQ (VELCADE) chemo injection 3 mg, 1.3 mg/m2 = 3 mg, Subcutaneous,  Once, 5 of 5 cycles Administration: 3 mg (11/01/2017), 3 mg (11/08/2017), 3 mg (11/05/2017), 3 mg (11/22/2017), 3 mg (11/12/2017), 3 mg (11/29/2017), 3 mg (11/26/2017), 3 mg (12/03/2017), 3 mg (12/13/2017), 3 mg (12/20/2017), 3 mg (12/17/2017), 3 mg (12/24/2017), 3 mg (01/03/2018), 3 mg (01/10/2018), 3 mg (01/07/2018), 3 mg (01/14/2018), 3 mg (01/24/2018)  for chemotherapy treatment.    06/11/2018 -  Chemotherapy    Patient is on Treatment Plan: MYELOMA MAINTENANCE BORTEZOMIB SQ Q 7D X 6 WEEKS, TWO WEEKS OFF THEN Q 14D        CANCER STAGING: Cancer Staging Multiple myeloma ([place]) Staging form: Plasma Cell Myeloma and Plasma Cell Disorders, AJCC 8th Edition - Clinical: No stage assigned - Unsigned - Clinical: No stage assigned - Unsigned   INTERVAL HISTORY:  Joanna Reid, a 55 y.o. female, returns for routine follow-up and consideration for next cycle of chemotherapy. Joanna Reid was last seen on 07/13/2020.  Due for cycle #57 of Velcade today.   Overall, Joanna Reid tells me Joanna Reid has been feeling pretty well. Joanna Reid is wearing an air boot on  her left foot since fracturing the 5th metatarsal. Joanna Reid is tolerating the Velcade well. Joanna Reid continues having constant tingling in her feet, but no pain, and takes Lyrica BID. Joanna Reid continues taking Bumex, acyclovir and ASA 81 daily. Her speech is improving, though Joanna Reid notes occasionally not finding the words especially when Joanna Reid gets flustered, but Joanna Reid continues reading often.  Joanna Reid got her COVID booster vaccine 6 weeks ago. Joanna Reid has a follow-up with foot care on 04/12.  Overall, Joanna Reid feels ready for next cycle of chemo today.    REVIEW OF SYSTEMS:  Review of Systems  Constitutional: Positive for fatigue (75%). Negative for appetite change.  Neurological: Positive for numbness (constant tingling in feet) and speech difficulty (improving s/p CVA).  All other systems reviewed and are negative.   PAST MEDICAL/SURGICAL HISTORY:  Past Medical History:  Diagnosis Date  . Acid reflux   . Allergic rhinitis   . Cancer ([place])    multiple myeloma  . Diabetes mellitus    type 2  . Gout   . Gout   . HBP (high blood pressure)   . History of kidney stones   . Migraines    Past Surgical History:  Procedure Laterality Date  . BREAST CYST EXCISION Left    2009 no visible scar on skin  . CESAREAN SECTION    . COLONOSCOPY WITH PROPOFOL N/A 12/25/2019   Procedure: COLONOSCOPY WITH PROPOFOL;  Surgeon: Rogene Houston, MD;  Location: [facility];  Service: Endoscopy;  Laterality: N/A;  730  . EXTRACORPOREAL SHOCK WAVE LITHOTRIPSY Left 10/10/2017   Procedure: LEFT EXTRACORPOREAL SHOCK WAVE LITHOTRIPSY (ESWL);  Surgeon: Bjorn Loser, MD;  Location: [facility] ORS;  Service: Urology;  Laterality: Left;  . EYE SURGERY    . HEMORRHOID SURGERY N/A 11/19/2012   Procedure: HEMORRHOIDECTOMY;  Surgeon: Jamesetta So, MD;  Location: [facility] ORS;  Service: General;  Laterality: N/A;  . kidney stones  1998  . LAPAROSCOPIC UNILATERAL SALPINGO OOPHERECTOMY  05/14/2012   Procedure: LAPAROSCOPIC UNILATERAL SALPINGO OOPHORECTOMY;   Surgeon: [name], MD;  Location: [facility] ORS;  Service: Gynecology;  Laterality: Right;  laparoscopic right salpingo-oophorectomy  . PARTIAL HYSTERECTOMY    . POLYPECTOMY  12/25/2019   Procedure: POLYPECTOMY;  Surgeon: Rogene Houston, MD;  Location: [facility];  Service: Endoscopy;;  . TONSILECTOMY, ADENOIDECTOMY, BILATERAL MYRINGOTOMY AND TUBES    . VESICOVAGINAL FISTULA CLOSURE W/ TAH      SOCIAL HISTORY:  Social History   Socioeconomic History  . Marital status: Married    Spouse name: Not on file  . Number of children: Not on file  . Years of education: 51  . Highest education level: Not on file  Occupational History    Employer: [employer]  Tobacco Use  . Smoking status: Former Smoker    Quit date: 02/27/1999    Years since quitting: 21.6  . Smokeless tobacco: Never Used  . Tobacco comment: socially   Vaping Use  . Vaping Use: Never used  Substance and Sexual Activity  . Alcohol use: No    Alcohol/week: 0.0 standard drinks  . Drug use: No  . Sexual activity: Yes  Other Topics Concern  . Not on file  Social History Narrative  . Not on file   Social Determinants of Health   Financial Resource Strain: Low Risk   . Difficulty of Paying Living Expenses: Not hard at all  Food Insecurity: No Food Insecurity  . Worried About Charity fundraiser in the Last Year: Never true  . Ran Out of Food in the Last Year: Never true  Transportation Needs: No Transportation Needs  . Lack of Transportation (Medical): No  . Lack of Transportation (Non-Medical): No  Physical Activity: Insufficiently Active  . Days of Exercise per Week: 7 days  . Minutes of Exercise per Session: 10 min  Stress: Not on file  Social Connections: Socially Integrated  . Frequency of Communication with Friends and Family: More than three times a week  . Frequency of Social Gatherings with Friends and Family: More than three times a week  . Attends Religious Services: More than 4 times per year  . Active  Member of Clubs or Organizations: Yes  . Attends Archivist Meetings: More than 4 times per year  . Marital Status: Married  Human resources officer Violence: Not At Risk  . Fear of Current or Ex-Partner: No  . Emotionally Abused: No  . Physically Abused: No  . Sexually Abused: No    FAMILY HISTORY:  Family History  Problem Relation Age of Onset  . Arthritis Other   . Cancer Other   . Diabetes Other   . Hypertension Mother   . Dementia Mother   . Diabetes Father   . ALS Father   . Diabetes Brother   . Hypertension Brother   . Cancer Paternal Aunt   . COPD Maternal Grandmother   . Cancer Maternal Grandfather   . Anesthesia problems Paternal Grandfather     CURRENT MEDICATIONS:  Current Outpatient Medications  Medication Sig Dispense Refill  . acyclovir (ZOVIRAX) 400 MG tablet TAKE (1) TABLET BY MOUTH TWICE DAILY. 60 tablet 3  . albuterol (PROVENTIL HFA;VENTOLIN HFA) 108 (90 Base) MCG/ACT inhaler Inhale 2 puffs into the lungs every 4 (four) hours as needed for wheezing or shortness of breath. 1 Inhaler 3  . allopurinol (ZYLOPRIM) 300 MG tablet TAKE 1 TABLET DAILY 90 tablet 0  . amLODipine (NORVASC) 5 MG tablet Take 5 mg by mouth daily.    Marland Kitchen aspirin EC 81 MG tablet Take 1 tablet (81 mg total) by mouth daily. 30 tablet 11  . bumetanide (BUMEX) 0.5 MG tablet Take 0.5 mg by mouth daily.     . carvedilol (COREG) 25 MG tablet Take 1 tablet (25 mg total) by mouth 2 (two) times daily.    . diazepam (VALIUM) 5 MG tablet Take 1 tablet (5 mg total) by mouth at bedtime. 30 tablet 2  . Docosanol 10 % CREA Apply 1 application topically daily as needed (for nose).     Marland Kitchen docusate sodium (COLACE) 100 MG capsule Take 100 mg by mouth daily.     Marland Kitchen erythromycin ophthalmic ointment Place 1 application into the left eye at bedtime. 3.5 g 0  . fluticasone (FLONASE) 50 MCG/ACT nasal spray Place 2 sprays into both nostrils daily. 16 g 0  . losartan (COZAAR) 100 MG tablet Take 100 mg by mouth daily.     . magnesium oxide (MAG-OX) 400 (241.3 Mg) MG tablet Take 1 tablet (400 mg total) by mouth 3 (three) times daily. 90 tablet 3  . metFORMIN (GLUCOPHAGE) 500 MG tablet TAKE 1 TABLET BY MOUTH ONCE DAILY WITH BREAKFAST (Patient taking differently: Take 1,000 mg by mouth 2 (two) times daily with a meal.) 90 tablet 0  . metroNIDAZOLE (FLAGYL) 500 MG tablet Take 1 tablet (500 mg total) by mouth 2 (two) times daily. 14 tablet 0  . ondansetron (ZOFRAN) 8 MG tablet TAKE 1 TABLET BY MOUTH EVERY 8 HOURS AS NEEDED. 30 tablet 6  . pantoprazole (PROTONIX) 40 MG tablet TAKE (1) TABLET BY MOUTH ONCE DAILY. 90 tablet 0  . pregabalin (LYRICA) 200 MG capsule TAKE (1) CAPSULE BY MOUTH TWICE DAILY. 60 capsule 6  . Propylene Glycol (SYSTANE BALANCE) 0.6 % SOLN Apply 1 drop to eye daily as needed (dry eye).     . rosuvastatin (CRESTOR) 10 MG tablet Take 1 tablet (10 mg total) by mouth daily. 30 tablet 0  . TRULICITY 1.5 YS/0.6TK SOPN Inject 0.75 mg into the skin every Monday.     . valACYclovir (VALTREX) 1000 MG tablet TAKE 2 TABLETS BY MOUTH NOW; THEN 2 12 HOURS LATER. 12 tablet 6  . prochlorperazine (COMPAZINE) 10 MG tablet TAKE 1 TABLET BY MOUTH EVERY 6 HOURS AS NEEDED FOR NAUSEA OR VOMITING. (Patient not taking: Reported on 10/05/2020) 30 tablet 6   No current facility-administered medications for this visit.   Facility-Administered Medications Ordered in Other Visits  Medication Dose Route Frequency Provider Last Rate Last Admin  . albuterol (VENTOLIN HFA) 108 (90 Base) MCG/ACT inhaler 2 puff  2 puff Inhalation Once PRN Eileen Stanford, PA-C      . bortezomib SQ (VELCADE) chemo injection (2.64m/mL concentration) 3 mg  1.3 mg/m2 (Treatment Plan Recorded) Subcutaneous Once KDerek Jack MD      . diphenhydrAMINE (BENADRYL) injection 50 mg  50 mg Intramuscular Once PRN TEileen Stanford PA-C      . EPINEPHrine (EPI-PEN) injection 0.3 mg  0.3 mg Intramuscular Once PRN Eileen Stanford, PA-C      .  methylPREDNISolone sodium succinate (SOLU-MEDROL) 125 mg/2 mL injection 125 mg  125 mg Intramuscular Once PRN Eileen Stanford, PA-C      . prochlorperazine (COMPAZINE) tablet 10 mg  10 mg Oral Once Derek Jack, MD        ALLERGIES:  No Known Allergies  PHYSICAL EXAM:  Performance status (ECOG): 1 - Symptomatic but completely ambulatory  Vitals:   10/05/20 1247  BP: 124/83  Pulse: 74  Resp: 20  Temp: (!) 96.9 F (36.1 C)  SpO2: 98%   Wt Readings from Last 3 Encounters:  10/05/20 258 lb 9.6 oz (117.3 kg)  09/21/20 263 lb (119.3 kg)  09/07/20 260 lb 9.6 oz (118.2 kg)   Physical Exam Vitals reviewed.  Constitutional:      Appearance: Normal appearance. Joanna Reid is obese.  Cardiovascular:     Rate and Rhythm: Normal rate and regular rhythm.     Pulses: Normal pulses.     Heart sounds: Normal heart sounds.  Pulmonary:     Effort: Pulmonary effort is normal.     Breath sounds: Normal breath sounds.  Musculoskeletal:     Right lower leg: No edema.     Comments: Air boot on L foot  Neurological:     General: No focal deficit present.     Mental Status: Joanna Reid is alert and oriented to person, place, and time.  Psychiatric:        Mood and Affect: Mood normal.        Behavior: Behavior normal.     LABORATORY DATA:  I have reviewed the labs as listed.  CBC Latest Ref Rng & Units 10/05/2020 09/21/2020 09/07/2020  WBC 4.0 - 10.5 K/uL 4.1 3.8(L) 3.9(L)  Hemoglobin 12.0 - 15.0 g/dL 12.1 11.6(L) 11.5(L)  Hematocrit 36.0 - 46.0 % 37.6 36.2 36.0  Platelets 150 - 400 K/uL 175 187 170   CMP Latest Ref Rng & Units 10/05/2020 09/21/2020 09/07/2020  Glucose 70 - 99 mg/dL 117(H) 107(H) 123(H)  BUN 6 - 20 mg/dL 27(H) 21(H) 27(H)  Creatinine 0.44 - 1.00 mg/dL 1.09(H) 0.93 1.08(H)  Sodium 135 - 145 mmol/L 143 140 140  Potassium 3.5 - 5.1 mmol/L 3.7 4.0 3.7  Chloride 98 - 111 mmol/L 108 106 105  CO2 22 - 32 mmol/L _0 Calcium 8.9 - 10.3 mg/dL 9.4 9.2 8.9  Total Protein 6.5 - 8.1  g/dL 6.8 6.8 6.9  Total Bilirubin 0.3 - 1.2 mg/dL 0.5 0.6 0.8  Alkaline Phos 38 - 126 U/L 54 57 57  AST 15 - 41 U/L _1 ALT 0 - 44 U/L _2 DIAGNOSTIC IMAGING:  I have independently reviewed the scans and discussed with the patient. No results found.   ASSESSMENT:  1. IgG lambda multiple myeloma, stage III, del 17 p: -4 cycles of RVD from 11/01/2017 through 01/14/2018. -Stem cell transplant on 02/20/2018 at [facility]. -Maintenance Velcade every 2 weeks and Revlimid 10 mg 3 weeks on/1 week off started on 07/29/2018. -Myeloma panel from 12/08/2018 shows M spike not observed. Kappa light chains of 32.1 with ratio of 1.64. Immunofixation was negative. -Joanna Reid hadCVAon 12/26/2019 with aphasia. MRI of the brain on 12/27/2019 shows acute ischemic nonhemorrhagic left MCA territory infarct involving left parietal lobe, corresponding with perfusion deficit on CT scan angiogram. No associated hemorrhage or mass-effect. Additional few scattered punctate acute ischemic nonhemorrhagic infarcts involving  bilateral frontal and parietal lobes as well as left cerebellum. -Last Velcade treatment was on 12/22/2019. We have held Revlimid since then.  2. CVA with aphasia: -We held her myeloma treatments since CVA with aphasia on 12/26/2019. -Her aphasia is improving.   PLAN:  1. IgG lambda multiple myeloma, stage III, del 17 p: -Reviewed myeloma labs from 09/08/2019.  M spike is not detected.  Kappa light chains are 19 with normal ratio.  Immunofixation was negative. -Reviewed labs from today which showed normal LFTs.  Creatinine is 1.09 and calcium normal. -CBC was normal. -Continue Velcade every 2 weeks as maintenance at this time as her disease is stable in remission. -RTC 3 months for follow-up with repeat labs. -Joanna Reid will receive Evusheldl today.  2. Hypomagnesemia: -Continue magnesium 400 mg twice daily.  Magnesium today is normal.  3. Bone strengthening agents: -Continue Zometa every 3  months.  4. Peripheral neuropathy: -Continue Lyrica 200 mg twice daily.  5. ID prophylaxis: -Continue acyclovir twice daily.  6. Diabetes: -Continue Metformin and Trulicity.   Orders placed this encounter:  No orders of the defined types were placed in this encounter.    Derek Jack, MD [place] [phone]   I, Milinda Antis, am acting as a scribe for Dr. Sanda Linger.  I, Derek Jack MD, have reviewed the above documentation for accuracy and completeness, and I agree with the above.

## 2020-10-05 NOTE — Progress Notes (Signed)
Pt here for 2 part Evusheld injection as well as velcade.  Pt does not have any complaints today. Vital signs and labs WNL for treatment today.  Good for treatment per Dr Raliegh Ip.  Velcade tolerated well today without incidence.  Tolerated evusheld well today without incidence and stable at discharge.  Observation for 1 hr post injection completed.  Vital signs stable prior to discharge.  Discharged in stable condition ambulatory.

## 2020-10-18 ENCOUNTER — Other Ambulatory Visit (HOSPITAL_COMMUNITY): Payer: Self-pay

## 2020-10-18 DIAGNOSIS — C9000 Multiple myeloma not having achieved remission: Secondary | ICD-10-CM

## 2020-10-19 ENCOUNTER — Inpatient Hospital Stay (HOSPITAL_COMMUNITY): Payer: BC Managed Care – PPO | Attending: Hematology

## 2020-10-19 ENCOUNTER — Other Ambulatory Visit: Payer: Self-pay

## 2020-10-19 ENCOUNTER — Inpatient Hospital Stay (HOSPITAL_COMMUNITY): Payer: BC Managed Care – PPO

## 2020-10-19 VITALS — BP 143/84 | HR 70 | Temp 96.9°F | Resp 18 | Wt 251.0 lb

## 2020-10-19 DIAGNOSIS — Z79899 Other long term (current) drug therapy: Secondary | ICD-10-CM | POA: Insufficient documentation

## 2020-10-19 DIAGNOSIS — C9000 Multiple myeloma not having achieved remission: Secondary | ICD-10-CM | POA: Diagnosis present

## 2020-10-19 DIAGNOSIS — Z5112 Encounter for antineoplastic immunotherapy: Secondary | ICD-10-CM | POA: Diagnosis present

## 2020-10-19 LAB — COMPREHENSIVE METABOLIC PANEL
ALT: 15 U/L (ref 0–44)
AST: 19 U/L (ref 15–41)
Albumin: 4 g/dL (ref 3.5–5.0)
Alkaline Phosphatase: 55 U/L (ref 38–126)
Anion gap: 11 (ref 5–15)
BUN: 22 mg/dL — ABNORMAL HIGH (ref 6–20)
CO2: 25 mmol/L (ref 22–32)
Calcium: 9.1 mg/dL (ref 8.9–10.3)
Chloride: 103 mmol/L (ref 98–111)
Creatinine, Ser: 1.03 mg/dL — ABNORMAL HIGH (ref 0.44–1.00)
GFR, Estimated: >60 mL/min (ref >60)
Glucose, Bld: 106 mg/dL — ABNORMAL HIGH (ref 70–99)
Potassium: 3.8 mmol/L (ref 3.5–5.1)
Sodium: 139 mmol/L (ref 135–145)
Total Bilirubin: 0.8 mg/dL (ref 0.3–1.2)
Total Protein: 6.6 g/dL (ref 6.5–8.1)

## 2020-10-19 LAB — CBC WITH DIFFERENTIAL/PLATELET
Abs Immature Granulocytes: 0.01 10*3/uL (ref 0.00–0.07)
Basophils Absolute: 0 10*3/uL (ref 0.0–0.1)
Basophils Relative: 1 %
Eosinophils Absolute: 0 10*3/uL (ref 0.0–0.5)
Eosinophils Relative: 1 %
HCT: 35.9 % — ABNORMAL LOW (ref 36.0–46.0)
Hemoglobin: 11.5 g/dL — ABNORMAL LOW (ref 12.0–15.0)
Immature Granulocytes: 0 %
Lymphocytes Relative: 28 %
Lymphs Abs: 1 10*3/uL (ref 0.7–4.0)
MCH: 31 pg (ref 26.0–34.0)
MCHC: 32 g/dL (ref 30.0–36.0)
MCV: 96.8 fL (ref 80.0–100.0)
Monocytes Absolute: 0.4 10*3/uL (ref 0.1–1.0)
Monocytes Relative: 11 %
Neutro Abs: 2.2 10*3/uL (ref 1.7–7.7)
Neutrophils Relative %: 59 %
Platelets: 156 10*3/uL (ref 150–400)
RBC: 3.71 MIL/uL — ABNORMAL LOW (ref 3.87–5.11)
RDW: 14.5 % (ref 11.5–15.5)
WBC: 3.7 10*3/uL — ABNORMAL LOW (ref 4.0–10.5)
nRBC: 0 % (ref 0.0–0.2)

## 2020-10-19 LAB — MAGNESIUM: Magnesium: 1.7 mg/dL (ref 1.7–2.4)

## 2020-10-19 MED ORDER — PROCHLORPERAZINE MALEATE 10 MG PO TABS
10.0000 mg | ORAL_TABLET | Freq: Once | ORAL | Status: AC
  Administered 2020-10-19: 10 mg via ORAL

## 2020-10-19 MED ORDER — PROCHLORPERAZINE MALEATE 10 MG PO TABS
ORAL_TABLET | ORAL | Status: AC
  Filled 2020-10-19: qty 1

## 2020-10-19 MED ORDER — BORTEZOMIB CHEMO SQ INJECTION 3.5 MG (2.5MG/ML)
1.3000 mg/m2 | Freq: Once | INTRAMUSCULAR | Status: AC
  Administered 2020-10-19: 3 mg via SUBCUTANEOUS
  Filled 2020-10-19: qty 1.2

## 2020-10-19 NOTE — Progress Notes (Signed)
Patient presents today for Velcade injection.  Vital signs within parameters for treatment.  Labs pending.  Patient has no new complaints since last visit.  Velcade injection given today per MD orders.  Stable during injection without adverse affects.  Injection site WNL.  Vital signs stable.  No complaints at this time.  Discharge from clinic ambulatory in stable condition.  Alert and oriented X 3.  Follow up with [facility] as scheduled.

## 2020-10-19 NOTE — Patient Instructions (Signed)
Bortezomib injection What is this medicine? BORTEZOMIB (bor TEZ oh mib) targets proteins in cancer cells and stops the cancer cells from growing. It treats multiple myeloma and mantle cell lymphoma. This medicine may be used for other purposes; ask your health care provider or pharmacist if you have questions. COMMON BRAND NAME(S): Velcade What should I tell my health care provider before I take this medicine? They need to know if you have any of these conditions:  dehydration  diabetes (high blood sugar)  heart disease  liver disease  tingling of the fingers or toes or other nerve disorder  an unusual or allergic reaction to bortezomib, mannitol, boron, other medicines, foods, dyes, or preservatives  pregnant or trying to get pregnant  breast-feeding How should I use this medicine? This medicine is injected into a vein or under the skin. It is given by a health care provider in a hospital or clinic setting. Talk to your health care provider about the use of this medicine in children. Special care may be needed. Overdosage: If you think you have taken too much of this medicine contact a poison control center or emergency room at once. NOTE: This medicine is only for you. Do not share this medicine with others. What if I miss a dose? Keep appointments for follow-up doses. It is important not to miss your dose. Call your health care provider if you are unable to keep an appointment. What may interact with this medicine? This medicine may interact with the following medications:  ketoconazole  rifampin This list may not describe all possible interactions. Give your health care provider a list of all the medicines, herbs, non-prescription drugs, or dietary supplements you use. Also tell them if you smoke, drink alcohol, or use illegal drugs. Some items may interact with your medicine. What should I watch for while using this medicine? Your condition will be monitored carefully while  you are receiving this medicine. You may need blood work done while you are taking this medicine. You may get drowsy or dizzy. Do not drive, use machinery, or do anything that needs mental alertness until you know how this medicine affects you. Do not stand up or sit up quickly, especially if you are an older patient. This reduces the risk of dizzy or fainting spells This medicine may increase your risk of getting an infection. Call your health care provider for advice if you get a fever, chills, sore throat, or other symptoms of a cold or flu. Do not treat yourself. Try to avoid being around people who are sick. Check with your health care provider if you have severe diarrhea, nausea, and vomiting, or if you sweat a lot. The loss of too much body fluid may make it dangerous for you to take this medicine. Do not become pregnant while taking this medicine or for 7 months after stopping it. Women should inform their health care provider if they wish to become pregnant or think they might be pregnant. Men should not father a child while taking this medicine and for 4 months after stopping it. There is a potential for serious harm to an unborn child. Talk to your health care provider for more information. Do not breast-feed an infant while taking this medicine or for 2 months after stopping it. This medicine may make it more difficult to get pregnant or father a child. Talk to your health care provider if you are concerned about your fertility. What side effects may I notice from receiving this medicine?   Side effects that you should report to your doctor or health care professional as soon as possible:  allergic reactions (skin rash; itching or hives; swelling of the face, lips, or tongue)  bleeding (bloody or black, tarry stools; red or dark brown urine; spitting up blood or brown material that looks like coffee grounds; red spots on the skin; unusual bruising or bleeding from the eye, gums, or  nose)  blurred vision or changes in vision  confusion  constipation  headache  heart failure (trouble breathing; fast, irregular heartbeat; sudden weight gain; swelling of the ankles, feet, hands)  infection (fever, chills, cough, sore throat, pain or trouble passing urine)  lack or loss of appetite  liver injury (dark yellow or brown urine; general ill feeling or flu-like symptoms; loss of appetite, right upper belly pain; yellowing of the eyes or skin)  low blood pressure (dizziness; feeling faint or lightheaded, falls; unusually weak or tired)  muscle cramps  pain, redness, or irritation at site where injected  pain, tingling, numbness in the hands or feet  seizures  trouble breathing  unusual bruising or bleeding Side effects that usually do not require medical attention (report to your doctor or health care professional if they continue or are bothersome):  diarrhea  nausea  stomach pain  trouble sleeping  vomiting This list may not describe all possible side effects. Call your doctor for medical advice about side effects. You may report side effects to FDA at 1-800-FDA-1088. Where should I keep my medicine? This medicine is given in a hospital or clinic. It will not be stored at home. NOTE: This sheet is a summary. It may not cover all possible information. If you have questions about this medicine, talk to your doctor, pharmacist, or health care provider.  2021 Elsevier/Gold Standard (2020-06-16 13:22:53)

## 2020-10-26 ENCOUNTER — Other Ambulatory Visit: Payer: Self-pay | Admitting: Family Medicine

## 2020-10-26 ENCOUNTER — Other Ambulatory Visit (HOSPITAL_COMMUNITY): Payer: Self-pay | Admitting: Hematology

## 2020-10-26 DIAGNOSIS — T451X5A Adverse effect of antineoplastic and immunosuppressive drugs, initial encounter: Secondary | ICD-10-CM

## 2020-10-26 DIAGNOSIS — F411 Generalized anxiety disorder: Secondary | ICD-10-CM

## 2020-10-26 DIAGNOSIS — C9000 Multiple myeloma not having achieved remission: Secondary | ICD-10-CM

## 2020-10-26 DIAGNOSIS — F430 Acute stress reaction: Secondary | ICD-10-CM

## 2020-10-26 DIAGNOSIS — G620 Drug-induced polyneuropathy: Secondary | ICD-10-CM

## 2020-10-28 ENCOUNTER — Other Ambulatory Visit: Payer: Self-pay | Admitting: Family Medicine

## 2020-11-01 ENCOUNTER — Ambulatory Visit (INDEPENDENT_AMBULATORY_CARE_PROVIDER_SITE_OTHER): Payer: BC Managed Care – PPO | Admitting: Family Medicine

## 2020-11-01 ENCOUNTER — Other Ambulatory Visit: Payer: Self-pay

## 2020-11-01 VITALS — BP 122/85 | HR 72 | Temp 97.2°F | Ht 64.0 in | Wt 253.0 lb

## 2020-11-01 DIAGNOSIS — Z8673 Personal history of transient ischemic attack (TIA), and cerebral infarction without residual deficits: Secondary | ICD-10-CM | POA: Diagnosis not present

## 2020-11-01 DIAGNOSIS — C9000 Multiple myeloma not having achieved remission: Secondary | ICD-10-CM | POA: Diagnosis not present

## 2020-11-01 DIAGNOSIS — F411 Generalized anxiety disorder: Secondary | ICD-10-CM

## 2020-11-01 DIAGNOSIS — F5101 Primary insomnia: Secondary | ICD-10-CM

## 2020-11-01 DIAGNOSIS — F430 Acute stress reaction: Secondary | ICD-10-CM

## 2020-11-01 MED ORDER — ESCITALOPRAM OXALATE 10 MG PO TABS: ORAL_TABLET | ORAL | 0 refills | Status: DC

## 2020-11-01 MED ORDER — DIAZEPAM 5 MG PO TABS: 5.0000 mg | ORAL_TABLET | Freq: Every day | ORAL | 2 refills | Status: DC

## 2020-11-01 NOTE — Progress Notes (Signed)
Patient ID: Joanna Reid, female    DOB: [birth year], 55 y.o.   MRN: [id number]   Chief Complaint  Patient presents with  . Medication Refill    diazepam   Subjective:    HPI  F/u anxiety-  H/o recurrent uti's- Pt was going to [employer] and having recurrent uti's.  Going back to [facility] soon and wondered if pt needing ID specialist. Took abx for 90 days. Pt went to urgent bacterial vaginosis, and treated with flagyl.   Had h/o cva in past year.  Seeing neuro, Dr. Marian Sorrow next week for yearly visit. Had a few months of therapy.  Now pt on her own with working on her speech.  Multiple myeloma, still seeing Dr. Delton Coombes, oncology.  After dx with cancer, was given valium 10m from previous pcp for insomnia. Pt is on lyrica since having stem cell transplant.  Pt hard to sleep.  Seeing endo for diabetes follow up. Pt stating taking 553mcrestor per endo.  2 days of sore throat with swallowing, been outside in pollen. Call if worse. Cont with claritin and flonase.  Medical History Ro[place]as a past medical history of Acid reflux, Allergic rhinitis, Cancer ([place] Diabetes mellitus, Gout, Gout, HBP (high blood pressure), History of kidney stones, and Migraines.   Outpatient Encounter Medications as of 11/01/2020  Medication Sig  . escitalopram (LEXAPRO) 10 MG tablet Take 1/2 tab p.o. for 7 days then increase to 1 tab daily.  . Marland Kitchencyclovir (ZOVIRAX) 400 MG tablet TAKE (1) TABLET BY MOUTH TWICE DAILY.  . Marland Kitchenlbuterol (PROVENTIL HFA;VENTOLIN HFA) 108 (90 Base) MCG/ACT inhaler Inhale 2 puffs into the lungs every 4 (four) hours as needed for wheezing or shortness of breath.  . allopurinol (ZYLOPRIM) 300 MG tablet TAKE (1) TABLET BY MOUTH ONCE DAILY.  . Marland KitchenmLODipine (NORVASC) 5 MG tablet Take 5 mg by mouth daily.  . Marland Kitchenspirin EC 81 MG tablet Take 1 tablet (81 mg total) by mouth daily.  . bumetanide (BUMEX) 0.5 MG tablet Take 0.5 mg by mouth daily.   . carvedilol (COREG) 25 MG tablet Take 1  tablet (25 mg total) by mouth 2 (two) times daily.  . diazepam (VALIUM) 5 MG tablet Take 1 tablet (5 mg total) by mouth at bedtime.  . Docosanol 10 % CREA Apply 1 application topically daily as needed (for nose).   . Marland Kitchenocusate sodium (COLACE) 100 MG capsule Take 100 mg by mouth daily.   . Marland Kitchenrythromycin ophthalmic ointment Place 1 application into the left eye at bedtime.  . fluticasone (FLONASE) 50 MCG/ACT nasal spray INSTILL 2 SPRAYS INTO BOTH NOSTRILS DAILY  . losartan (COZAAR) 100 MG tablet Take 100 mg by mouth daily.  . magnesium oxide (MAG-OX) 400 (241.3 Mg) MG tablet Take 1 tablet (400 mg total) by mouth 3 (three) times daily.  . metFORMIN (GLUCOPHAGE) 500 MG tablet TAKE 1 TABLET BY MOUTH ONCE DAILY WITH BREAKFAST (Patient taking differently: Take 1,000 mg by mouth 2 (two) times daily with a meal.)  . ondansetron (ZOFRAN) 8 MG tablet TAKE 1 TABLET BY MOUTH EVERY 8 HOURS AS NEEDED.  . Marland Kitchenantoprazole (PROTONIX) 40 MG tablet TAKE (1) TABLET BY MOUTH ONCE DAILY.  . Marland Kitchenregabalin (LYRICA) 200 MG capsule TAKE (1) CAPSULE BY MOUTH TWICE DAILY.  . Marland Kitchenrochlorperazine (COMPAZINE) 10 MG tablet TAKE 1 TABLET BY MOUTH EVERY 6 HOURS AS NEEDED FOR NAUSEA OR VOMITING.  . Marland Kitchenropylene Glycol (SYSTANE BALANCE) 0.6 % SOLN Apply 1 drop to eye daily as needed (dry eye).   .Marland Kitchen  rosuvastatin (CRESTOR) 5 MG tablet Take 5 mg by mouth daily.  . TRULICITY 1.5 YQ/6.5HQ SOPN Inject 0.75 mg into the skin every Monday.   . valACYclovir (VALTREX) 1000 MG tablet TAKE 2 TABLETS BY MOUTH NOW; THEN 2 12 HOURS LATER.  . [DISCONTINUED] diazepam (VALIUM) 5 MG tablet Take 1 tablet (5 mg total) by mouth at bedtime.  . [DISCONTINUED] metroNIDAZOLE (FLAGYL) 500 MG tablet Take 1 tablet (500 mg total) by mouth 2 (two) times daily.  . [DISCONTINUED] rosuvastatin (CRESTOR) 10 MG tablet Take 1 tablet (10 mg total) by mouth daily.   No facility-administered encounter medications on file as of 11/01/2020.     Review of Systems  Constitutional:  Negative for chills and fever.  HENT: Negative for congestion, rhinorrhea and sore throat.   Respiratory: Negative for cough, shortness of breath and wheezing.   Cardiovascular: Negative for chest pain and leg swelling.  Gastrointestinal: Negative for abdominal pain, diarrhea, nausea and vomiting.  Genitourinary: Negative for dysuria and frequency.  Musculoskeletal: Negative for arthralgias and back pain.  Skin: Negative for rash.  Neurological: Negative for dizziness, weakness and headaches.  Psychiatric/Behavioral: Positive for sleep disturbance. Negative for dysphoric mood, self-injury and suicidal ideas. The patient is not nervous/anxious.      Vitals BP 122/85   Pulse 72   Temp (!) 97.2 F (36.2 C)   Ht $R'5\' 4"'fk$  (1.626 m)   Wt 253 lb (114.8 kg)   SpO2 99%   BMI 43.43 kg/m   Objective:   Physical Exam Vitals and nursing note reviewed.  Constitutional:      Appearance: Normal appearance.  HENT:     Head: Normocephalic and atraumatic.     Nose: Nose normal.     Mouth/Throat:     Mouth: Mucous membranes are moist.     Pharynx: Oropharynx is clear.  Eyes:     Extraocular Movements: Extraocular movements intact.     Conjunctiva/sclera: Conjunctivae normal.     Pupils: Pupils are equal, round, and reactive to light.  Cardiovascular:     Rate and Rhythm: Normal rate and regular rhythm.     Pulses: Normal pulses.     Heart sounds: Normal heart sounds.  Pulmonary:     Effort: Pulmonary effort is normal.     Breath sounds: Normal breath sounds. No wheezing, rhonchi or rales.  Musculoskeletal:        General: Normal range of motion.     Right lower leg: No edema.     Left lower leg: No edema.  Skin:    General: Skin is warm and dry.     Findings: No lesion or rash.  Neurological:     General: No focal deficit present.     Mental Status: She is alert and oriented to person, place, and time.     Cranial Nerves: No cranial nerve deficit.     Comments: +slight speech  deficit  Psychiatric:        Mood and Affect: Mood normal.        Behavior: Behavior normal.        Thought Content: Thought content normal.        Judgment: Judgment normal.      Assessment and Plan   1. Primary insomnia  2. Anxiety as acute reaction to exceptional stress - diazepam (VALIUM) 5 MG tablet; Take 1 tablet (5 mg total) by mouth at bedtime.  Dispense: 30 tablet; Refill: 2  3. Multiple myeloma, remission status unspecified ([place])  4.  History of CVA (cerebrovascular accident)   Insomnia- stable. Using valium prn.  Anxiety- cont lexapro and valium prn.  Multiple myeloma-not achieved remission.  Getting infusion Velcade and RVD. seeing oncology, s/p stem cell transplant.  cont f/u with oncology.  H/o CVA- stable, improving with speech.  No other residual deficits.   Return in about 6 months (around 05/03/2021) for f/u anxiety, insomnia.

## 2020-11-02 ENCOUNTER — Inpatient Hospital Stay (HOSPITAL_COMMUNITY): Payer: BC Managed Care – PPO

## 2020-11-02 ENCOUNTER — Other Ambulatory Visit: Payer: Self-pay

## 2020-11-02 ENCOUNTER — Encounter (HOSPITAL_COMMUNITY): Payer: Self-pay

## 2020-11-02 VITALS — BP 126/78 | HR 64 | Temp 96.9°F | Resp 18 | Ht 64.0 in | Wt 250.4 lb

## 2020-11-02 DIAGNOSIS — C9000 Multiple myeloma not having achieved remission: Secondary | ICD-10-CM

## 2020-11-02 DIAGNOSIS — Z5112 Encounter for antineoplastic immunotherapy: Secondary | ICD-10-CM | POA: Diagnosis not present

## 2020-11-02 LAB — COMPREHENSIVE METABOLIC PANEL
ALT: 13 U/L (ref 0–44)
AST: 21 U/L (ref 15–41)
Albumin: 4.3 g/dL (ref 3.5–5.0)
Alkaline Phosphatase: 63 U/L (ref 38–126)
Anion gap: 13 (ref 5–15)
BUN: 21 mg/dL — ABNORMAL HIGH (ref 6–20)
CO2: 23 mmol/L (ref 22–32)
Calcium: 9.7 mg/dL (ref 8.9–10.3)
Chloride: 101 mmol/L (ref 98–111)
Creatinine, Ser: 1.06 mg/dL — ABNORMAL HIGH (ref 0.44–1.00)
GFR, Estimated: >60 mL/min (ref >60)
Glucose, Bld: 113 mg/dL — ABNORMAL HIGH (ref 70–99)
Potassium: 4.1 mmol/L (ref 3.5–5.1)
Sodium: 137 mmol/L (ref 135–145)
Total Bilirubin: 0.9 mg/dL (ref 0.3–1.2)
Total Protein: 7.4 g/dL (ref 6.5–8.1)

## 2020-11-02 LAB — CBC WITH DIFFERENTIAL/PLATELET
Abs Immature Granulocytes: 0.01 10*3/uL (ref 0.00–0.07)
Basophils Absolute: 0 10*3/uL (ref 0.0–0.1)
Basophils Relative: 1 %
Eosinophils Absolute: 0 10*3/uL (ref 0.0–0.5)
Eosinophils Relative: 0 %
HCT: 37.9 % (ref 36.0–46.0)
Hemoglobin: 12.3 g/dL (ref 12.0–15.0)
Immature Granulocytes: 0 %
Lymphocytes Relative: 34 %
Lymphs Abs: 1.4 10*3/uL (ref 0.7–4.0)
MCH: 31.1 pg (ref 26.0–34.0)
MCHC: 32.5 g/dL (ref 30.0–36.0)
MCV: 95.7 fL (ref 80.0–100.0)
Monocytes Absolute: 0.5 10*3/uL (ref 0.1–1.0)
Monocytes Relative: 12 %
Neutro Abs: 2.2 10*3/uL (ref 1.7–7.7)
Neutrophils Relative %: 53 %
Platelets: 198 10*3/uL (ref 150–400)
RBC: 3.96 MIL/uL (ref 3.87–5.11)
RDW: 14.4 % (ref 11.5–15.5)
WBC: 4.2 10*3/uL (ref 4.0–10.5)
nRBC: 0 % (ref 0.0–0.2)

## 2020-11-02 LAB — LACTATE DEHYDROGENASE: LDH: 152 U/L (ref 98–192)

## 2020-11-02 LAB — MAGNESIUM: Magnesium: 1.8 mg/dL (ref 1.7–2.4)

## 2020-11-02 MED ORDER — PROCHLORPERAZINE MALEATE 10 MG PO TABS: 10.0000 mg | ORAL_TABLET | Freq: Once | ORAL | Status: DC

## 2020-11-02 MED ORDER — BORTEZOMIB CHEMO SQ INJECTION 3.5 MG (2.5MG/ML)
1.3000 mg/m2 | Freq: Once | INTRAMUSCULAR | Status: AC
  Administered 2020-11-02: 3 mg via SUBCUTANEOUS
  Filled 2020-11-02: qty 1.2

## 2020-11-02 NOTE — Patient Instructions (Signed)
[facility]  Discharge Instructions: Thank you for choosing [facility] to provide your oncology and hematology care.  If you have a lab appointment with the [facility], please come in thru the Main Entrance and check in at the main information desk.  Wear comfortable clothing and clothing appropriate for easy access to any Portacath or PICC line.   We strive to give you quality time with your provider. You may need to reschedule your appointment if you arrive late (15 or more minutes).  Arriving late affects you and other patients whose appointments are after yours.  Also, if you miss three or more appointments without notifying the office, you may be dismissed from the clinic at the provider's discretion.      For prescription refill requests, have your pharmacy contact our office and allow 72 hours for refills to be completed.    Today you received the following chemotherapy-velcade injection    To help prevent nausea and vomiting after your treatment, we encourage you to take your nausea medication as directed.  BELOW ARE SYMPTOMS THAT SHOULD BE REPORTED IMMEDIATELY: . *FEVER GREATER THAN 100.4 F (38 C) OR HIGHER . *CHILLS OR SWEATING . *NAUSEA AND VOMITING THAT IS NOT CONTROLLED WITH YOUR NAUSEA MEDICATION . *UNUSUAL SHORTNESS OF BREATH . *UNUSUAL BRUISING OR BLEEDING . *URINARY PROBLEMS (pain or burning when urinating, or frequent urination) . *BOWEL PROBLEMS (unusual diarrhea, constipation, pain near the anus) . TENDERNESS IN MOUTH AND THROAT WITH OR WITHOUT PRESENCE OF ULCERS (sore throat, sores in mouth, or a toothache) . UNUSUAL RASH, SWELLING OR PAIN  . UNUSUAL VAGINAL DISCHARGE OR ITCHING   Items with * indicate a potential emergency and should be followed up as soon as possible or go to the Emergency Department if any problems should occur.  Please show the CHEMOTHERAPY ALERT CARD or IMMUNOTHERAPY ALERT CARD at check-in to the Emergency  Department and triage nurse.  Should you have questions after your visit or need to cancel or reschedule your appointment, please contact [facility] [phone]  and follow the prompts.  Office hours are 8:00 a.m. to 4:30 p.m. Monday - Friday. Please note that voicemails left after 4:00 p.m. may not be returned until the following business day.  We are closed weekends and major holidays. You have access to a nurse at all times for urgent questions. Please call the main number to the clinic [phone] and follow the prompts.  For any non-urgent questions, you may also contact your provider using MyChart. We now offer e-Visits for anyone 57 and older to request care online for non-urgent symptoms. For details visit [email or web address].   Also download the MyChart app! Go to the app store, search "MyChart", open the app, select [facility], and log in with your MyChart username and password.  Due to Covid, a mask is required upon entering the hospital/clinic. If you do not have a mask, one will be given to you upon arrival. For doctor visits, patients may have 1 support person aged 53 or older with them. For treatment visits, patients cannot have anyone with them due to current Covid guidelines and our immunocompromised population.

## 2020-11-02 NOTE — Progress Notes (Signed)
Labs meet parameters for treatment today.   Treatment given per orders. Patient tolerated it well without problems. Vitals stable and discharged home from clinic ambulatory. Follow up as scheduled.  

## 2020-11-03 LAB — KAPPA/LAMBDA LIGHT CHAINS
Kappa free light chain: 20.2 mg/L — ABNORMAL HIGH (ref 3.3–19.4)
Kappa, lambda light chain ratio: 1.36 (ref 0.26–1.65)
Lambda free light chains: 14.9 mg/L (ref 5.7–26.3)

## 2020-11-04 LAB — PROTEIN ELECTROPHORESIS, SERUM
A/G Ratio: 1.2 (ref 0.7–1.7)
Albumin ELP: 3.7 g/dL (ref 2.9–4.4)
Alpha-1-Globulin: 0.3 g/dL (ref 0.0–0.4)
Alpha-2-Globulin: 0.9 g/dL (ref 0.4–1.0)
Beta Globulin: 1.1 g/dL (ref 0.7–1.3)
Gamma Globulin: 0.8 g/dL (ref 0.4–1.8)
Globulin, Total: 3 g/dL (ref 2.2–3.9)
Total Protein ELP: 6.7 g/dL (ref 6.0–8.5)

## 2020-11-04 LAB — IMMUNOFIXATION ELECTROPHORESIS
IgA: 98 mg/dL (ref 87–352)
IgG (Immunoglobin G), Serum: 806 mg/dL (ref 586–1602)
IgM (Immunoglobulin M), Srm: 20 mg/dL — ABNORMAL LOW (ref 26–217)
Total Protein ELP: 6.9 g/dL (ref 6.0–8.5)

## 2020-11-10 ENCOUNTER — Encounter: Payer: Self-pay | Admitting: Adult Health

## 2020-11-10 ENCOUNTER — Ambulatory Visit (INDEPENDENT_AMBULATORY_CARE_PROVIDER_SITE_OTHER): Payer: BC Managed Care – PPO | Admitting: Adult Health

## 2020-11-10 ENCOUNTER — Telehealth: Payer: Self-pay | Admitting: Adult Health

## 2020-11-10 VITALS — BP 97/73 | HR 60 | Ht 64.0 in | Wt 251.0 lb

## 2020-11-10 DIAGNOSIS — I69320 Aphasia following cerebral infarction: Secondary | ICD-10-CM | POA: Diagnosis not present

## 2020-11-10 DIAGNOSIS — I63412 Cerebral infarction due to embolism of left middle cerebral artery: Secondary | ICD-10-CM

## 2020-11-10 DIAGNOSIS — E119 Type 2 diabetes mellitus without complications: Secondary | ICD-10-CM | POA: Diagnosis not present

## 2020-11-10 DIAGNOSIS — E785 Hyperlipidemia, unspecified: Secondary | ICD-10-CM

## 2020-11-10 NOTE — Progress Notes (Signed)
[facility] [address] [place]. [place] [address] [phone]       STROKE FOLLOW UP NOTE  [name] Date of Birth:  09/27/1965 Medical Record Number:  [id number]   Reason for Referral: stroke follow up    SUBJECTIVE:   CHIEF COMPLAINT:  Chief Complaint  Patient presents with  . Follow-up    Rm 14 with spouse (pierre) Pt is well, speech is getting better but overall stable     HPI:   Today, 11/10/2020, [name] returns for stroke follow-up after prior visit 6 months ago accompanied by her husband  Stable from stroke standpoint without new stroke/TIA symptoms and occasional word finding difficulty typically worse with heightened emotions but otherwise doing well. Also mixes up numbers. No issues with letters   Compliant on aspirin and Crestor without associated side effects Blood pressure today 97/73 - no routinely monitored at home as it has been stable and routinely monitored at cancer center Continues to follow closely with endocrinology for DM management  Continues to follow with oncology for multiple myeloma continuing to receive Velcade every 2 weeks as maintenance as her disease is noted to currently be in remission.  She also remains on pregabalin 200 mg twice daily for peripheral neuropathy per oncology.  No further concerns at this time     History provided for reference purposes only Update 05/12/2020 JM: [name] returns for stroke follow-up.  She has been doing well since prior visit with ongoing improvement of her speech.  She does notice worsening more so when she is excited or fatigued.  Continues to do exercises routinely at home.  Denies new or worsening stroke/TIA symptoms.  She remains on aspirin 81 mg daily without bleeding or bruising.  Remains on Crestor 10 mg daily without myalgias.  Blood pressure today 140/84.  Continues to follow with endocrinology for DM management.  She continues to follow closely with  oncology for multiple myeloma continuing Velcade maintenance with Revlimid on hold since her stroke. Per patient, current myeloma counts satisfactory therefore not planning on restarting Revlimid unless indicated in the future.  She continues to follow with urology for recurrent UTIs.  No further concerns at this time.  Initial visit 01/26/2020 JM: [name] is being seen for hospital follow-up accompanied by her husband.  Residual deficits of expressive aphasia and reading difficulty with ongoing improvement working with outpatient SLP.  Currently wearing cardiac monitor ordered by [facility] cardiology and will be completed at the end of the week.  Apparently was discharged on aspirin 81 mg daily and clopidogrel 75 mg daily and denies bleeding or bruising.  Continues on Crestor 10 mg daily without myalgias.  Blood pressure today 118/72.  Recent change to antihypertensive regimen by cardiology due to continued elevated blood pressures.  Routinely monitors at home.  Continues to follow with endocrinology for diabetic management.  Chemotherapy remains on hold and questioning duration of restarting.  Plans on repeat lab work next week.  No further concerns at this time.  Stroke admission 12/25/2019 [name] is a 55 y.o. female with history of multiple myeloma (s/p stem cell transplant and currently on maintenance Velcade and Revlimid), Covid positive in March, morbid obesity, DM, HTN, migraines and smoking s/p screening colonoscopy on 12/25/2019. She developed confusion and expressive aphasia on Saturday (12/26/2019).  Eventually transferred to [facility] for further work-up.  Stroke work-up revealed embolic left MCA parietal branch stroke as well scattered punctate bilateral frontal and parietal and left  cerebellar infarcts.  CTA no significant large vessel stenosis or occlusion.  Currently on chemotherapy with thrombocytopenia therefore dual platelet not recommended and recommended continuation of aspirin 81 mg.   HTN stable with long-term BP goal normotensive range.  LDL 33 and increased Crestor to 10m daily.  History of DM with A1c 5.7.  Other stroke risk factors include former tobacco use, obesity and migraines.  Other active problems include leukopenia, anemia, thrombocytopenia, hypokalemia, UTI and Covid positive 09/2019.  Advised to ensure follow-up with oncology with chemotherapy possibly placed on hold for a few months during recovery.  No further evaluation including assessment for dysrhythmias and need for anticoagulation due to thrombocytopenia recommended considering in the future with possible 30-day cardiac event monitor.  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   Resultant mild word finding difficulties/mild aphasia.  Code Stroke CT Head - Normal CT appearance of the brain. ASPECTS is 10/10.     CT head - not ordered  MRI head -small left parietal MCA branch infarct.  Tiny scattered punctate bilateral frontal and parietal and left cerebellar infarcts   MRA head - not ordered  CTA H&N -  No emergent large vessel occlusion. Mild narrowing of the mid basilar artery without other significant proximal stenosis, aneurysm, or branch vessel occlusion. Moderate distal small vessel disease in both the anterior and posterior circulation. Moderate tortuosity of the cervical internal carotid arteries bilaterally, likely secondary to hypertension. Atherosclerotic changes at the left carotid bifurcation and cavernous internal carotid arteries bilaterally without significant stenosis.  CT Perfusion - 24 mL left parietal lobe watershed region area of ischemia without defined core infarct.   Carotid Doppler - CTA neck performed - carotid dopplers not indicated.  2D Echo - EF 60 - 65%. No cardiac source of emboli identified.   [employer] 2 - negative  LDL - 107  HgbA1c - 5.7  UDS - benzodiazepine  VTE prophylaxis - [place] Heparin  aspirin 81 mg daily prior to admission, now on aspirin 81 mg  daily    Patient counseled to be compliant with her antithrombotic medications  Ongoing aggressive stroke risk factor management  Therapy recommendations: OP SLP  Disposition: home       ROS:   14 system review of systems performed and negative with exception of speech difficulty  PMH:  Past Medical History:  Diagnosis Date  . Acid reflux   . Allergic rhinitis   . [place]    multiple [place]  . Diabetes mellitus    type 2  . Gout   . Gout   . HBP (high blood pressure)   . History of kidney stones   . Migraines     PSH:  Past Surgical History:  Procedure Laterality Date  . BREAST CYST EXCISION Left    2009 no visible scar on skin  . CESAREAN SECTION    . COLONOSCOPY WITH PROPOFOL N/A 12/25/2019   Procedure: COLONOSCOPY WITH PROPOFOL;  Surgeon: RRogene Houston MD;  Location: [facility];  Service: Endoscopy;  Laterality: N/A;  730  . EXTRACORPOREAL SHOCK WAVE LITHOTRIPSY Left 10/10/2017   Procedure: LEFT EXTRACORPOREAL SHOCK WAVE LITHOTRIPSY (ESWL);  Surgeon: MBjorn Loser MD;  Location: [facility];  Service: Urology;  Laterality: Left;  . EYE SURGERY    . HEMORRHOID SURGERY N/A 11/19/2012   Procedure: HEMORRHOIDECTOMY;  Surgeon: MJamesetta So MD;  Location: [facility];  Service: General;  Laterality: N/A;  . kidney stones  1998  . LAPAROSCOPIC UNILATERAL SALPINGO  OOPHERECTOMY  05/14/2012   Procedure: LAPAROSCOPIC UNILATERAL SALPINGO OOPHORECTOMY;  Surgeon: [name], MD;  Location: [facility];  Service: Gynecology;  Laterality: Right;  laparoscopic right salpingo-oophorectomy  . PARTIAL HYSTERECTOMY    . POLYPECTOMY  12/25/2019   Procedure: POLYPECTOMY;  Surgeon: Rogene Houston, MD;  Location: [facility];  Service: Endoscopy;;  . TONSILECTOMY, ADENOIDECTOMY, BILATERAL MYRINGOTOMY AND TUBES    . VESICOVAGINAL FISTULA CLOSURE W/ TAH      Social History:  Social History   Socioeconomic History  . Marital status: Married    Spouse name: Not on file  .  Number of children: Not on file  . Years of education: 40  . Highest education level: Not on file  Occupational History    Employer: [employer]  Tobacco Use  . Smoking status: Former Smoker    Quit date: 02/27/1999    Years since quitting: 21.7  . Smokeless tobacco: Never Used  . Tobacco comment: socially   Vaping Use  . Vaping Use: Never used  Substance and Sexual Activity  . Alcohol use: No    Alcohol/week: 0.0 standard drinks  . Drug use: No  . Sexual activity: Yes  Other Topics Concern  . Not on file  Social History Narrative  . Not on file   Social Determinants of Health   Financial Resource Strain: Not on file  Food Insecurity: Not on file  Transportation Needs: No Transportation Needs  . Lack of Transportation (Medical): No  . Lack of Transportation (Non-Medical): No  Physical Activity: Not on file  Stress: Not on file  Social Connections: Not on file  Intimate Partner Violence: Not on file    Family History:  Family History  Problem Relation Age of Onset  . Arthritis Other   . Cancer Other   . Diabetes Other   . Hypertension Mother   . Dementia Mother   . Diabetes Father   . ALS Father   . Diabetes Brother   . Hypertension Brother   . Cancer Paternal Aunt   . COPD Maternal Grandmother   . Cancer Maternal Grandfather   . Anesthesia problems Paternal Grandfather     Medications:   Current Outpatient Medications on File Prior to Visit  Medication Sig Dispense Refill  . acyclovir (ZOVIRAX) 400 MG tablet TAKE (1) TABLET BY MOUTH TWICE DAILY. 60 tablet 3  . albuterol (PROVENTIL HFA;VENTOLIN HFA) 108 (90 Base) MCG/ACT inhaler Inhale 2 puffs into the lungs every 4 (four) hours as needed for wheezing or shortness of breath. 1 Inhaler 3  . allopurinol (ZYLOPRIM) 300 MG tablet TAKE (1) TABLET BY MOUTH ONCE DAILY. 90 tablet 0  . amLODipine (NORVASC) 5 MG tablet Take 5 mg by mouth daily.    Marland Kitchen aspirin EC 81 MG tablet Take 1 tablet (81 mg total) by mouth daily. 30  tablet 11  . bumetanide (BUMEX) 0.5 MG tablet Take 0.5 mg by mouth daily.     . carvedilol (COREG) 25 MG tablet Take 1 tablet (25 mg total) by mouth 2 (two) times daily.    . diazepam (VALIUM) 5 MG tablet Take 1 tablet (5 mg total) by mouth at bedtime. 30 tablet 2  . Docosanol 10 % CREA Apply 1 application topically daily as needed (for nose).     Marland Kitchen docusate sodium (COLACE) 100 MG capsule Take 100 mg by mouth daily.     Marland Kitchen erythromycin ophthalmic ointment Place 1 application into the left eye at bedtime. 3.5 g 0  .  escitalopram (LEXAPRO) 10 MG tablet Take 1/2 tab p.o. for 7 days then increase to 1 tab daily. 90 tablet 0  . fluticasone (FLONASE) 50 MCG/ACT nasal spray INSTILL 2 SPRAYS INTO BOTH NOSTRILS DAILY 16 g 0  . losartan (COZAAR) 100 MG tablet Take 100 mg by mouth daily.    . magnesium oxide (MAG-OX) 400 (241.3 Mg) MG tablet Take 1 tablet (400 mg total) by mouth 3 (three) times daily. 90 tablet 3  . metFORMIN (GLUCOPHAGE) 500 MG tablet TAKE 1 TABLET BY MOUTH ONCE DAILY WITH BREAKFAST (Patient taking differently: Take 1,000 mg by mouth 2 (two) times daily with a meal.) 90 tablet 0  . ondansetron (ZOFRAN) 8 MG tablet TAKE 1 TABLET BY MOUTH EVERY 8 HOURS AS NEEDED. 30 tablet 6  . pantoprazole (PROTONIX) 40 MG tablet TAKE (1) TABLET BY MOUTH ONCE DAILY. 90 tablet 0  . pregabalin (LYRICA) 200 MG capsule TAKE (1) CAPSULE BY MOUTH TWICE DAILY. 60 capsule 3  . prochlorperazine (COMPAZINE) 10 MG tablet TAKE 1 TABLET BY MOUTH EVERY 6 HOURS AS NEEDED FOR NAUSEA OR VOMITING. 30 tablet 6  . Propylene Glycol (SYSTANE BALANCE) 0.6 % SOLN Apply 1 drop to eye daily as needed (dry eye).     . rosuvastatin (CRESTOR) 5 MG tablet Take 5 mg by mouth daily.    . TRULICITY 1.5 RR/1.1AF SOPN Inject 0.75 mg into the skin every Monday.     . valACYclovir (VALTREX) 1000 MG tablet TAKE 2 TABLETS BY MOUTH NOW; THEN 2 12 HOURS LATER. 12 tablet 6   No current facility-administered medications on file prior to visit.     Allergies:  No Known Allergies    OBJECTIVE:  Physical Exam  Vitals:   11/10/20 0936  BP: 97/73  Pulse: 60  Weight: 251 lb (113.9 kg)  Height: 5' 4"  (1.626 m)   Body mass index is 43.08 kg/m. No exam data present   General: well developed, well nourished, very pleasant middle-age Caucasian female, seated, in no evident distress Head: head normocephalic and atraumatic.   Neck: supple with no carotid or supraclavicular bruits Cardiovascular: regular rate and rhythm, no murmurs Musculoskeletal: no deformity Skin:  no rash/petichiae Vascular:  Normal pulses all extremities   Neurologic Exam Mental Status: Awake and fully alert.  Mild expressive aphasia with occasional speech hesitancy.  Oriented to place and time. Recent and remote memory intact. Attention span, concentration and fund of knowledge appropriate. Mood and affect appropriate.  Cranial Nerves: Pupils equal, briskly reactive to light. Extraocular movements full without nystagmus. Visual fields full to confrontation. Hearing intact. Facial sensation intact. Face, tongue, palate moves normally and symmetrically.  Motor: Normal bulk and tone. Normal strength in all tested extremity muscles. Sensory.: intact to touch , pinprick , position and vibratory sensation.  Coordination: Rapid alternating movements normal in all extremities. Finger-to-nose and heel-to-shin performed accurately bilaterally. Gait and Station: Arises from chair without difficulty. Stance is normal. Gait demonstrates normal stride length and balance without use of assistive device Reflexes: 1+ and symmetric. Toes downgoing.       ASSESSMENT/PLAN: Joanna Reid is a 55 y.o. year old female presented with confusion and expressive aphasia on 12/28/2019 after screening colonoscopy and holding of aspirin with stroke work-up revealing embolic left MCA parietal branch infarct secondary to unknown source.  Currently receiving chemotherapy for multiple  myeloma and had thrombocytopenia during admission.  Vascular risk factors include multiple myeloma on chemotherapy, thrombocytopenia, HTN, HLD, DM and migraines.  1. Left MCA stroke:  a. Residual deficits: Mild expressive aphasia and possible alexia -referral placed to SLP b. Continue aspirin 81 mg daily  and Crestor 10 mg daily for secondary stroke prevention.  c. 30-day cardiac event monitor negative for atrial fibrillation d. Discussed secondary stroke prevention measures and importance of close PCP follow-up for aggressive stroke risk factor management 2. HTN: BP goal<130/90.  Controlled although on low side today on losartan carvedilol and amlodipine per PCP -advised to routinely monitor at home as well as ensuring adequate fluid intake 3. HLD: LDL<70.  On Crestor 10 mg daily per PCP.  Repeat lipid panel today 4. DMII: A1c<7.  On Trulicity and Metformin per endocrinology -repeat A1c today 5. Multiple myeloma: Routinely followed by oncology receiving Velcade infusions every 2 weeks   Follow up in 6 months or call earlier if needed   CC:  [place] provider: Dr. Harle Stanford, [place], DO     Frann Rider, [facility] [address] [place], [place] [address]  Phone [phone] Fax [phone] Note: This document was prepared with digital dictation and possible smart phrase technology. Any transcriptional errors that result from this process are unintentional.

## 2020-11-10 NOTE — Telephone Encounter (Signed)
Sent speech therapy referral to [facility] in [place] via [place].

## 2020-11-10 NOTE — Patient Instructions (Signed)
You will be called to restart speech therapy  Continue aspirin 81 mg daily  and Crestor  for secondary stroke prevention  We will recheck your cholesterol and A1c levels today  Continue to follow up with PCP regarding cholesterol and blood pressure management and endocrinology for diabetes  Maintain strict control of hypertension with blood pressure goal below 130/90, diabetes with hemoglobin A1c goal below 7% and cholesterol with LDL cholesterol (bad cholesterol) goal below 70 mg/dL.       Followup in the future with me in 6 months or call earlier if needed       Thank you for coming to see [name] at [facility]. I hope we have been able to provide you high quality care today.  You may receive a patient satisfaction survey over the next few weeks. We would appreciate your feedback and comments so that we may continue to improve ourselves and the health of our patients.

## 2020-11-11 LAB — LIPID PANEL
Chol/HDL Ratio: 3.1 ratio (ref 0.0–4.4)
Cholesterol, Total: 131 mg/dL (ref 100–199)
HDL: 42 mg/dL (ref >39)
LDL Chol Calc (NIH): 56 mg/dL (ref 0–99)
Triglycerides: 199 mg/dL — ABNORMAL HIGH (ref 0–149)
VLDL Cholesterol Cal: 33 mg/dL (ref 5–40)

## 2020-11-11 LAB — HEMOGLOBIN A1C
Est. average glucose Bld gHb Est-mCnc: 114 mg/dL
Hgb A1c MFr Bld: 5.6 % (ref 4.8–5.6)

## 2020-11-14 NOTE — Progress Notes (Signed)
I agree with the above plan 

## 2020-11-15 ENCOUNTER — Encounter: Payer: Self-pay | Admitting: Obstetrics & Gynecology

## 2020-11-15 ENCOUNTER — Other Ambulatory Visit: Payer: Self-pay

## 2020-11-15 ENCOUNTER — Ambulatory Visit: Payer: BC Managed Care – PPO | Admitting: Obstetrics & Gynecology

## 2020-11-15 ENCOUNTER — Other Ambulatory Visit (HOSPITAL_COMMUNITY): Payer: Self-pay | Admitting: Surgery

## 2020-11-15 VITALS — BP 122/81 | HR 70 | Ht 64.0 in | Wt 253.5 lb

## 2020-11-15 DIAGNOSIS — C9000 Multiple myeloma not having achieved remission: Secondary | ICD-10-CM

## 2020-11-15 DIAGNOSIS — N390 Urinary tract infection, site not specified: Secondary | ICD-10-CM

## 2020-11-15 DIAGNOSIS — R1032 Left lower quadrant pain: Secondary | ICD-10-CM

## 2020-11-15 DIAGNOSIS — B9689 Other specified bacterial agents as the cause of diseases classified elsewhere: Secondary | ICD-10-CM

## 2020-11-15 DIAGNOSIS — N760 Acute vaginitis: Secondary | ICD-10-CM

## 2020-11-15 LAB — POCT URINALYSIS DIPSTICK
Blood, UA: NEGATIVE
Glucose, UA: NEGATIVE
Ketones, UA: NEGATIVE
Nitrite, UA: POSITIVE
Protein, UA: NEGATIVE

## 2020-11-15 MED ORDER — METRONIDAZOLE 0.75 % VA GEL: VAGINAL | 0 refills | Status: DC

## 2020-11-15 MED ORDER — CIPROFLOXACIN HCL 500 MG PO TABS: 500.0000 mg | ORAL_TABLET | Freq: Two times a day (BID) | ORAL | 0 refills | Status: DC

## 2020-11-15 MED ORDER — FLUCONAZOLE 150 MG PO TABS
ORAL_TABLET | ORAL | 1 refills | Status: DC
Start: 2020-11-15 — End: 2020-11-30

## 2020-11-15 NOTE — Progress Notes (Signed)
Chief Complaint  Patient presents with   pain in left ovary area      55 y.o. [id number] No LMP recorded. Patient has had a hysterectomy. The current method of family planning is status post hysterectomy.  Outpatient Encounter Medications as of 11/15/2020  Medication Sig Note   acyclovir (ZOVIRAX) 400 MG tablet TAKE (1) TABLET BY MOUTH TWICE DAILY.    albuterol (PROVENTIL HFA;VENTOLIN HFA) 108 (90 Base) MCG/ACT inhaler Inhale 2 puffs into the lungs every 4 (four) hours as needed for wheezing or shortness of breath. 02/16/2020: States has not needed.    allopurinol (ZYLOPRIM) 300 MG tablet TAKE (1) TABLET BY MOUTH ONCE DAILY.    amLODipine (NORVASC) 5 MG tablet Take 5 mg by mouth daily.    aspirin EC 81 MG tablet Take 1 tablet (81 mg total) by mouth daily.    bumetanide (BUMEX) 0.5 MG tablet Take 0.5 mg by mouth daily.  02/16/2020: States take every other day per MD order.    carvedilol (COREG) 25 MG tablet Take 1 tablet (25 mg total) by mouth 2 (two) times daily.    ciprofloxacin (CIPRO) 500 MG tablet Take 1 tablet (500 mg total) by mouth 2 (two) times daily.    diazepam (VALIUM) 5 MG tablet Take 1 tablet (5 mg total) by mouth at bedtime.    Docosanol 10 % CREA Apply 1 application topically daily as needed (for nose).     docusate sodium (COLACE) 100 MG capsule Take 100 mg by mouth daily.     erythromycin ophthalmic ointment Place 1 application into the left eye at bedtime.    escitalopram (LEXAPRO) 10 MG tablet Take 1/2 tab p.o. for 7 days then increase to 1 tab daily.    fluconazole (DIFLUCAN) 150 MG tablet Take 1 tablet today and repeat in 3 days    fluticasone (FLONASE) 50 MCG/ACT nasal spray INSTILL 2 SPRAYS INTO BOTH NOSTRILS DAILY    losartan (COZAAR) 100 MG tablet Take 100 mg by mouth daily.    magnesium oxide (MAG-OX) 400 (241.3 Mg) MG tablet Take 1 tablet (400 mg total) by mouth 3 (three) times daily.    metFORMIN (GLUCOPHAGE) 500 MG tablet TAKE 1 TABLET BY MOUTH ONCE DAILY  WITH BREAKFAST (Patient taking differently: Take 1,000 mg by mouth 2 (two) times daily with a meal.) 02/16/2020: States takes 500mg  in am and in pm with meals.    metroNIDAZOLE (METROGEL VAGINAL) 0.75 % vaginal gel Nightly x 5 nights    ondansetron (ZOFRAN) 8 MG tablet TAKE 1 TABLET BY MOUTH EVERY 8 HOURS AS NEEDED.    pantoprazole (PROTONIX) 40 MG tablet TAKE (1) TABLET BY MOUTH ONCE DAILY.    pregabalin (LYRICA) 200 MG capsule TAKE (1) CAPSULE BY MOUTH TWICE DAILY.    prochlorperazine (COMPAZINE) 10 MG tablet TAKE 1 TABLET BY MOUTH EVERY 6 HOURS AS NEEDED FOR NAUSEA OR VOMITING.    Propylene Glycol (SYSTANE BALANCE) 0.6 % SOLN Apply 1 drop to eye daily as needed (dry eye).     rosuvastatin (CRESTOR) 5 MG tablet Take 5 mg by mouth daily.    TRULICITY 1.5 MG/0.5ML SOPN Inject 0.75 mg into the skin every Monday.     valACYclovir (VALTREX) 1000 MG tablet TAKE 2 TABLETS BY MOUTH NOW; THEN 2 12 HOURS LATER.    No facility-administered encounter medications on file as of 11/15/2020.    Subjective Pain LLQ No radiation Past Medical History:  Diagnosis Date   Acid reflux  Allergic rhinitis    Cancer (HCC)    multiple myeloma   Diabetes mellitus    type 2   Gout    Gout    HBP (high blood pressure)    History of kidney stones    Migraines     Past Surgical History:  Procedure Laterality Date   BREAST CYST EXCISION Left    2009 no visible scar on skin   CESAREAN SECTION     COLONOSCOPY WITH PROPOFOL N/A 12/25/2019   Procedure: COLONOSCOPY WITH PROPOFOL;  Surgeon: Malissa Hippo, MD;  Location: [facility];  Service: Endoscopy;  Laterality: N/A;  730   EXTRACORPOREAL SHOCK WAVE LITHOTRIPSY Left 10/10/2017   Procedure: LEFT EXTRACORPOREAL SHOCK WAVE LITHOTRIPSY (ESWL);  Surgeon: Alfredo Martinez, MD;  Location: [facility];  Service: Urology;  Laterality: Left;   EYE SURGERY     HEMORRHOID SURGERY N/A 11/19/2012   Procedure: HEMORRHOIDECTOMY;  Surgeon: Dalia Heading, MD;  Location: [facility];  Service: General;  Laterality: N/A;   kidney stones  1998   LAPAROSCOPIC UNILATERAL SALPINGO OOPHERECTOMY  05/14/2012   Procedure: LAPAROSCOPIC UNILATERAL SALPINGO OOPHORECTOMY;  Surgeon: Lazaro Arms, MD;  Location: [facility];  Service: Gynecology;  Laterality: Right;  laparoscopic right salpingo-oophorectomy   PARTIAL HYSTERECTOMY     POLYPECTOMY  12/25/2019   Procedure: POLYPECTOMY;  Surgeon: Malissa Hippo, MD;  Location: [facility];  Service: Endoscopy;;   TONSILECTOMY, ADENOIDECTOMY, BILATERAL MYRINGOTOMY AND TUBES     VESICOVAGINAL FISTULA CLOSURE W/ TAH      OB History     Gravida  2   Para  1   Term      Preterm  1   AB  1   Living  1      SAB  1   IAB      Ectopic      Multiple      Live Births  1           No Known Allergies  Social History   Socioeconomic History   Marital status: Married    Spouse name: Not on file   Number of children: Not on file   Years of education: 12   Highest education level: Not on file  Occupational History    Employer: [employer]  Tobacco Use   Smoking status: Former Smoker    Quit date: 02/27/1999    Years since quitting: 21.7   Smokeless tobacco: Never Used   Tobacco comment: socially   Building services engineer Use: Never used  Substance and Sexual Activity   Alcohol use: No    Alcohol/week: 0.0 standard drinks   Drug use: No   Sexual activity: Yes    Birth control/protection: Surgical    Comment: hyst  Other Topics Concern   Not on file  Social History Narrative   Not on file   Social Determinants of Health   Financial Resource Strain: Not on file  Food Insecurity: Not on file  Transportation Needs: No Transportation Needs   Lack of Transportation (Medical): No   Lack of Transportation (Non-Medical): No  Physical Activity: Not on file  Stress: Not on file  Social Connections: Not on file    Family History  Problem Relation Age of Onset   Arthritis Other    Cancer Other    Diabetes Other     Hypertension Mother    Dementia Mother    Diabetes Father    ALS Father  Diabetes Brother    Hypertension Brother    Cancer Paternal Aunt    COPD Maternal Grandmother    Cancer Maternal Grandfather    Anesthesia problems Paternal Grandfather     Medications:       Current Outpatient Medications:    acyclovir (ZOVIRAX) 400 MG tablet, TAKE (1) TABLET BY MOUTH TWICE DAILY., Disp: 60 tablet, Rfl: 3   albuterol (PROVENTIL HFA;VENTOLIN HFA) 108 (90 Base) MCG/ACT inhaler, Inhale 2 puffs into the lungs every 4 (four) hours as needed for wheezing or shortness of breath., Disp: 1 Inhaler, Rfl: 3   allopurinol (ZYLOPRIM) 300 MG tablet, TAKE (1) TABLET BY MOUTH ONCE DAILY., Disp: 90 tablet, Rfl: 0   amLODipine (NORVASC) 5 MG tablet, Take 5 mg by mouth daily., Disp: , Rfl:    aspirin EC 81 MG tablet, Take 1 tablet (81 mg total) by mouth daily., Disp: 30 tablet, Rfl: 11   bumetanide (BUMEX) 0.5 MG tablet, Take 0.5 mg by mouth daily. , Disp: , Rfl:    carvedilol (COREG) 25 MG tablet, Take 1 tablet (25 mg total) by mouth 2 (two) times daily., Disp: , Rfl:    ciprofloxacin (CIPRO) 500 MG tablet, Take 1 tablet (500 mg total) by mouth 2 (two) times daily., Disp: 6 tablet, Rfl: 0   diazepam (VALIUM) 5 MG tablet, Take 1 tablet (5 mg total) by mouth at bedtime., Disp: 30 tablet, Rfl: 2   Docosanol 10 % CREA, Apply 1 application topically daily as needed (for nose). , Disp: , Rfl:    docusate sodium (COLACE) 100 MG capsule, Take 100 mg by mouth daily. , Disp: , Rfl:    erythromycin ophthalmic ointment, Place 1 application into the left eye at bedtime., Disp: 3.5 g, Rfl: 0   escitalopram (LEXAPRO) 10 MG tablet, Take 1/2 tab p.o. for 7 days then increase to 1 tab daily., Disp: 90 tablet, Rfl: 0   fluconazole (DIFLUCAN) 150 MG tablet, Take 1 tablet today and repeat in 3 days, Disp: 2 tablet, Rfl: 1   fluticasone (FLONASE) 50 MCG/ACT nasal spray, INSTILL 2 SPRAYS INTO BOTH NOSTRILS DAILY, Disp: 16 g, Rfl: 0    losartan (COZAAR) 100 MG tablet, Take 100 mg by mouth daily., Disp: , Rfl:    magnesium oxide (MAG-OX) 400 (241.3 Mg) MG tablet, Take 1 tablet (400 mg total) by mouth 3 (three) times daily., Disp: 90 tablet, Rfl: 3   metFORMIN (GLUCOPHAGE) 500 MG tablet, TAKE 1 TABLET BY MOUTH ONCE DAILY WITH BREAKFAST (Patient taking differently: Take 1,000 mg by mouth 2 (two) times daily with a meal.), Disp: 90 tablet, Rfl: 0   metroNIDAZOLE (METROGEL VAGINAL) 0.75 % vaginal gel, Nightly x 5 nights, Disp: 70 g, Rfl: 0   ondansetron (ZOFRAN) 8 MG tablet, TAKE 1 TABLET BY MOUTH EVERY 8 HOURS AS NEEDED., Disp: 30 tablet, Rfl: 6   pantoprazole (PROTONIX) 40 MG tablet, TAKE (1) TABLET BY MOUTH ONCE DAILY., Disp: 90 tablet, Rfl: 0   pregabalin (LYRICA) 200 MG capsule, TAKE (1) CAPSULE BY MOUTH TWICE DAILY., Disp: 60 capsule, Rfl: 3   prochlorperazine (COMPAZINE) 10 MG tablet, TAKE 1 TABLET BY MOUTH EVERY 6 HOURS AS NEEDED FOR NAUSEA OR VOMITING., Disp: 30 tablet, Rfl: 6   Propylene Glycol (SYSTANE BALANCE) 0.6 % SOLN, Apply 1 drop to eye daily as needed (dry eye). , Disp: , Rfl:    rosuvastatin (CRESTOR) 5 MG tablet, Take 5 mg by mouth daily., Disp: , Rfl:    TRULICITY 1.5 MG/0.5ML SOPN,  Inject 0.75 mg into the skin every Monday. , Disp: , Rfl:    valACYclovir (VALTREX) 1000 MG tablet, TAKE 2 TABLETS BY MOUTH NOW; THEN 2 12 HOURS LATER., Disp: 12 tablet, Rfl: 6  Objective Blood pressure 122/81, pulse 70, height 5\' 4"  (1.626 m), weight 253 lb 8 oz (115 kg).    Pertinent ROS   Labs or studies     Impression Diagnoses this Encounter::   ICD-10-CM   1. Urinary tract infection without hematuria, site unspecified  N39.0 POCT Urinalysis Dipstick    Urine Culture  2. Bacterial vaginosis  N76.0    B96.89   3. LLQ pain  R10.32 US PELVIS (TRANSABDOMINAL ONLY)    US PELVIS TRANSVAGINAL NON-OB (TV ONLY)    Established relevant diagnosis(es):   Plan/Recommendations: Meds ordered this encounter  Medications    ciprofloxacin (CIPRO) 500 MG tablet    Sig: Take 1 tablet (500 mg total) by mouth 2 (two) times daily.    Dispense:  6 tablet    Refill:  0   metroNIDAZOLE (METROGEL VAGINAL) 0.75 % vaginal gel    Sig: Nightly x 5 nights    Dispense:  70 g    Refill:  0   fluconazole (DIFLUCAN) 150 MG tablet    Sig: Take 1 tablet today and repeat in 3 days    Dispense:  2 tablet    Refill:  1    Labs or Scans Ordered: Orders Placed This Encounter  Procedures   Urine Culture   US PELVIS (TRANSABDOMINAL ONLY)   US PELVIS TRANSVAGINAL NON-OB (TV ONLY)   POCT Urinalysis Dipstick    Management::   Follow up No follow-ups on file.      All questions were answered.

## 2020-11-16 ENCOUNTER — Encounter (HOSPITAL_COMMUNITY): Payer: Self-pay

## 2020-11-16 ENCOUNTER — Inpatient Hospital Stay (HOSPITAL_COMMUNITY): Payer: BC Managed Care – PPO

## 2020-11-16 ENCOUNTER — Inpatient Hospital Stay (HOSPITAL_COMMUNITY): Payer: BC Managed Care – PPO | Attending: Hematology

## 2020-11-16 ENCOUNTER — Other Ambulatory Visit: Payer: Self-pay

## 2020-11-16 VITALS — BP 114/79 | HR 66 | Temp 96.9°F | Resp 18 | Wt 252.0 lb

## 2020-11-16 DIAGNOSIS — Z79899 Other long term (current) drug therapy: Secondary | ICD-10-CM | POA: Insufficient documentation

## 2020-11-16 DIAGNOSIS — Z5112 Encounter for antineoplastic immunotherapy: Secondary | ICD-10-CM | POA: Insufficient documentation

## 2020-11-16 DIAGNOSIS — C9000 Multiple myeloma not having achieved remission: Secondary | ICD-10-CM

## 2020-11-16 LAB — MAGNESIUM: Magnesium: 1.6 mg/dL — ABNORMAL LOW (ref 1.7–2.4)

## 2020-11-16 LAB — COMPREHENSIVE METABOLIC PANEL
ALT: 13 U/L (ref 0–44)
AST: 19 U/L (ref 15–41)
Albumin: 4 g/dL (ref 3.5–5.0)
Alkaline Phosphatase: 57 U/L (ref 38–126)
Anion gap: 9 (ref 5–15)
BUN: 24 mg/dL — ABNORMAL HIGH (ref 6–20)
CO2: 25 mmol/L (ref 22–32)
Calcium: 9.2 mg/dL (ref 8.9–10.3)
Chloride: 105 mmol/L (ref 98–111)
Creatinine, Ser: 1 mg/dL (ref 0.44–1.00)
GFR, Estimated: >60 mL/min (ref >60)
Glucose, Bld: 99 mg/dL (ref 70–99)
Potassium: 4 mmol/L (ref 3.5–5.1)
Sodium: 139 mmol/L (ref 135–145)
Total Bilirubin: 0.8 mg/dL (ref 0.3–1.2)
Total Protein: 6.7 g/dL (ref 6.5–8.1)

## 2020-11-16 LAB — CBC WITH DIFFERENTIAL/PLATELET
Abs Immature Granulocytes: 0.01 10*3/uL (ref 0.00–0.07)
Basophils Absolute: 0 10*3/uL (ref 0.0–0.1)
Basophils Relative: 1 %
Eosinophils Absolute: 0 10*3/uL (ref 0.0–0.5)
Eosinophils Relative: 1 %
HCT: 36.9 % (ref 36.0–46.0)
Hemoglobin: 11.8 g/dL — ABNORMAL LOW (ref 12.0–15.0)
Immature Granulocytes: 0 %
Lymphocytes Relative: 29 %
Lymphs Abs: 1.4 10*3/uL (ref 0.7–4.0)
MCH: 31.1 pg (ref 26.0–34.0)
MCHC: 32 g/dL (ref 30.0–36.0)
MCV: 97.1 fL (ref 80.0–100.0)
Monocytes Absolute: 0.4 10*3/uL (ref 0.1–1.0)
Monocytes Relative: 9 %
Neutro Abs: 2.9 10*3/uL (ref 1.7–7.7)
Neutrophils Relative %: 60 %
Platelets: 167 10*3/uL (ref 150–400)
RBC: 3.8 MIL/uL — ABNORMAL LOW (ref 3.87–5.11)
RDW: 14.7 % (ref 11.5–15.5)
WBC: 4.8 10*3/uL (ref 4.0–10.5)
nRBC: 0 % (ref 0.0–0.2)

## 2020-11-16 MED ORDER — PROCHLORPERAZINE MALEATE 10 MG PO TABS: 10.0000 mg | ORAL_TABLET | Freq: Once | ORAL | Status: DC

## 2020-11-16 MED ORDER — BORTEZOMIB CHEMO SQ INJECTION 3.5 MG (2.5MG/ML)
1.3000 mg/m2 | Freq: Once | INTRAMUSCULAR | Status: AC
  Administered 2020-11-16: 3 mg via SUBCUTANEOUS
  Filled 2020-11-16: qty 1.2

## 2020-11-16 NOTE — Progress Notes (Signed)
Pt here for velcade today.  Magnesium 1.6.  Pt takes magnesium at home but was not taking it as prescribed.  RN explained to take magnesium 3 times a day as prescribed.  Pt verbalized understanding.  Pt does have UTI and has to pick up her antibiotics today.  Vital signs stable for treatment.  Joanna Reid presents today for injection per the provider's orders.  velcade in abdomen administration without incident; injection site WNL; see MAR for injection details.  Patient tolerated procedure well and without incident.  No questions or complaints noted at this time. Stable during and after injection.  AVS reviewed.  Discharged in stable condition.

## 2020-11-16 NOTE — Patient Instructions (Signed)
[facility]  Discharge Instructions: Thank you for choosing [facility] to provide your oncology and hematology care.  If you have a lab appointment with the [facility], please come in thru the Main Entrance and check in at the main information desk.  Wear comfortable clothing and clothing appropriate for easy access to any Portacath or PICC line.   We strive to give you quality time with your provider. You may need to reschedule your appointment if you arrive late (15 or more minutes).  Arriving late affects you and other patients whose appointments are after yours.  Also, if you miss three or more appointments without notifying the office, you may be dismissed from the clinic at the provider's discretion.      For prescription refill requests, have your pharmacy contact our office and allow 72 hours for refills to be completed.    Today you received the following chemotherapy and/or immunotherapy agents velcade.  Magnesium 1.6.  RN explained to take magnesium 3 times a day as prescribed.    To help prevent nausea and vomiting after your treatment, we encourage you to take your nausea medication as directed.  BELOW ARE SYMPTOMS THAT SHOULD BE REPORTED IMMEDIATELY: . *FEVER GREATER THAN 100.4 F (38 C) OR HIGHER . *CHILLS OR SWEATING . *NAUSEA AND VOMITING THAT IS NOT CONTROLLED WITH YOUR NAUSEA MEDICATION . *UNUSUAL SHORTNESS OF BREATH . *UNUSUAL BRUISING OR BLEEDING . *URINARY PROBLEMS (pain or burning when urinating, or frequent urination) . *BOWEL PROBLEMS (unusual diarrhea, constipation, pain near the anus) . TENDERNESS IN MOUTH AND THROAT WITH OR WITHOUT PRESENCE OF ULCERS (sore throat, sores in mouth, or a toothache) . UNUSUAL RASH, SWELLING OR PAIN  . UNUSUAL VAGINAL DISCHARGE OR ITCHING   Items with * indicate a potential emergency and should be followed up as soon as possible or go to the Emergency Department if any problems should occur.  Please  show the CHEMOTHERAPY ALERT CARD or IMMUNOTHERAPY ALERT CARD at check-in to the Emergency Department and triage nurse.  Should you have questions after your visit or need to cancel or reschedule your appointment, please contact [facility] [phone]  and follow the prompts.  Office hours are 8:00 a.m. to 4:30 p.m. Monday - Friday. Please note that voicemails left after 4:00 p.m. may not be returned until the following business day.  We are closed weekends and major holidays. You have access to a nurse at all times for urgent questions. Please call the main number to the clinic [phone] and follow the prompts.  For any non-urgent questions, you may also contact your provider using MyChart. We now offer e-Visits for anyone 62 and older to request care online for non-urgent symptoms. For details visit [email or web address].   Also download the MyChart app! Go to the app store, search "MyChart", open the app, select [facility], and log in with your MyChart username and password.  Due to Covid, a mask is required upon entering the hospital/clinic. If you do not have a mask, one will be given to you upon arrival. For doctor visits, patients may have 1 support person aged 38 or older with them. For treatment visits, patients cannot have anyone with them due to current Covid guidelines and our immunocompromised population.

## 2020-11-21 LAB — URINE CULTURE

## 2020-11-23 ENCOUNTER — Ambulatory Visit (HOSPITAL_COMMUNITY): Payer: Medicare Other

## 2020-11-23 ENCOUNTER — Other Ambulatory Visit (HOSPITAL_COMMUNITY): Payer: Medicare Other

## 2020-11-29 ENCOUNTER — Telehealth: Payer: Self-pay

## 2020-11-29 NOTE — Telephone Encounter (Signed)
Pt needs refill on albuterol (PROVENTIL HFA;VENTOLIN HFA) 108 (90 Base) MCG/ACT inhaler [[id number]] [place], [place] [address]   Pt call back [phone]

## 2020-11-29 NOTE — Telephone Encounter (Signed)
Med check up 11/01/20

## 2020-11-29 NOTE — Telephone Encounter (Signed)
Pt med needs to go to [facility]

## 2020-11-29 NOTE — Progress Notes (Signed)
[place], [place] [address]   CLINIC:  Medical Oncology/Hematology  PCP:  [name], DO [address] / [place] [address] [phone]   REASON FOR VISIT:  Follow-up for multiple myeloma  PRIOR THERAPY:  1. RVD x 4 cycles from 11/01/2017 through 01/14/2018. 2. Stem cell transplant on 02/20/2018. 3. Velcade from 06/11/2018 to 12/22/2019.  NGS Results: not done  CURRENT THERAPY: Velcade every 2 weeks  BRIEF ONCOLOGIC HISTORY:  Oncology History  Multiple myeloma ([facility])  10/29/2017 Initial Diagnosis   Multiple myeloma ([facility])   11/01/2017 - 01/24/2018 Chemotherapy   The patient had dexamethasone (DECADRON) 4 MG tablet, 1 of 1 cycle, Start date: --, End date: -- lenalidomide (REVLIMID) 25 MG capsule, 1 of 1 cycle, Start date: --, End date: -- bortezomib SQ (VELCADE) chemo injection 3 mg, 1.3 mg/m2 = 3 mg, Subcutaneous,  Once, 5 of 5 cycles Administration: 3 mg (11/01/2017), 3 mg (11/08/2017), 3 mg (11/05/2017), 3 mg (11/22/2017), 3 mg (11/12/2017), 3 mg (11/29/2017), 3 mg (11/26/2017), 3 mg (12/03/2017), 3 mg (12/13/2017), 3 mg (12/20/2017), 3 mg (12/17/2017), 3 mg (12/24/2017), 3 mg (01/03/2018), 3 mg (01/10/2018), 3 mg (01/07/2018), 3 mg (01/14/2018), 3 mg (01/24/2018)  for chemotherapy treatment.    06/11/2018 -  Chemotherapy    Patient is on Treatment Plan: MYELOMA MAINTENANCE BORTEZOMIB SQ Q 7D X 6 WEEKS, TWO WEEKS OFF THEN Q 14D        CANCER STAGING: Cancer Staging Multiple myeloma ([place]) Staging form: Plasma Cell Myeloma and Plasma Cell Disorders, AJCC 8th Edition - Clinical: No stage assigned - Unsigned - Clinical: No stage assigned - Unsigned   INTERVAL HISTORY:  Ms. DNASIA GAUNA, a 55 y.o. female, returns for routine follow-up and consideration for next cycle of chemotherapy. Selah was last seen on 10/05/2020.  Due for cycle #61 of Velcade today.   Overall, she tells me she has been feeling pretty well. She has had back pain for 2  weeks. She has kidney stones in her right kidney. She has had 6 UTIs since the beginning of the year. She is taking magnesium TID. She denies any diarrhea. Her neuropathy is aggravated by long periods of standing; Lyrica helps with this. There is numbness and tingling in the feet, but there is no pain. She reports a previous break in her left foot causes occasional pain, but this is unrelated to the neuropathy. She reports being unable to dial phone numbers due to dyslexia, but reports she is able to read text on paper and computers without difficulty. This only occurs with numbers and began following her stroke.   Overall, she feels ready for next cycle of chemo today.   REVIEW OF SYSTEMS:  Review of Systems  Constitutional: Positive for fatigue (75%). Negative for appetite change.  Gastrointestinal: Negative for diarrhea.  Musculoskeletal: Positive for back pain (5/10).  Neurological: Positive for numbness (feet).  All other systems reviewed and are negative.   PAST MEDICAL/SURGICAL HISTORY:  Past Medical History:  Diagnosis Date  . Acid reflux   . Allergic rhinitis   . Cancer ([facility])    multiple myeloma  . Diabetes mellitus    type 2  . Gout   . Gout   . HBP (high blood pressure)   . History of kidney stones   . Migraines    Past Surgical History:  Procedure Laterality Date  . BREAST CYST EXCISION Left    2009 no visible scar on skin  .  CESAREAN SECTION    . COLONOSCOPY WITH PROPOFOL N/A 12/25/2019   Procedure: COLONOSCOPY WITH PROPOFOL;  Surgeon: Rogene Houston, MD;  [facility];  Service: Endoscopy;  Laterality: N/A;  730  . EXTRACORPOREAL SHOCK WAVE LITHOTRIPSY Left 10/10/2017   Procedure: LEFT EXTRACORPOREAL SHOCK WAVE LITHOTRIPSY (ESWL);  Surgeon: Bjorn Loser, MD;  Location: [facility]S;  Service: Urology;  Laterality: Left;  . EYE SURGERY    . HEMORRHOID SURGERY N/A 11/19/2012   Procedure: HEMORRHOIDECTOMY;  Surgeon: Jamesetta So, MD;  Location: [facility] ORS;   Service: General;  Laterality: N/A;  . kidney stones  1998  . LAPAROSCOPIC UNILATERAL SALPINGO OOPHERECTOMY  05/14/2012   Procedure: LAPAROSCOPIC UNILATERAL SALPINGO OOPHORECTOMY;  Surgeon: [name], MD;  Location: [facility];  Service: Gynecology;  Laterality: Right;  laparoscopic right salpingo-oophorectomy  . PARTIAL HYSTERECTOMY    . POLYPECTOMY  12/25/2019   Procedure: POLYPECTOMY;  Surgeon: Rogene Houston, MD;  Location: [facility];  Service: Endoscopy;;  . TONSILECTOMY, ADENOIDECTOMY, BILATERAL MYRINGOTOMY AND TUBES    . VESICOVAGINAL FISTULA CLOSURE W/ TAH      SOCIAL HISTORY:  Social History   Socioeconomic History  . Marital status: Married    Spouse name: Not on file  . Number of children: Not on file  . Years of education: 76  . Highest education level: Not on file  Occupational History    Employer: [employer]  Tobacco Use  . Smoking status: Former Smoker    Quit date: 02/27/1999    Years since quitting: 21.7  . Smokeless tobacco: Never Used  . Tobacco comment: socially   Vaping Use  . Vaping Use: Never used  Substance and Sexual Activity  . Alcohol use: No    Alcohol/week: 0.0 standard drinks  . Drug use: No  . Sexual activity: Yes    Birth control/protection: Surgical    Comment: hyst  Other Topics Concern  . Not on file  Social History Narrative  . Not on file   Social Determinants of Health   Financial Resource Strain: Not on file  Food Insecurity: Not on file  Transportation Needs: No Transportation Needs  . Lack of Transportation (Medical): No  . Lack of Transportation (Non-Medical): No  Physical Activity: Not on file  Stress: Not on file  Social Connections: Not on file  Intimate Partner Violence: Not on file    FAMILY HISTORY:  Family History  Problem Relation Age of Onset  . Arthritis Other   . Cancer Other   . Diabetes Other   . Hypertension Mother   . Dementia Mother   . Diabetes Father   . ALS Father   . Diabetes Brother   .  Hypertension Brother   . Cancer Paternal Aunt   . COPD Maternal Grandmother   . Cancer Maternal Grandfather   . Anesthesia problems Paternal Grandfather     CURRENT MEDICATIONS:  Current Outpatient Medications  Medication Sig Dispense Refill  . acyclovir (ZOVIRAX) 400 MG tablet TAKE (1) TABLET BY MOUTH TWICE DAILY. 60 tablet 3  . albuterol (PROVENTIL HFA;VENTOLIN HFA) 108 (90 Base) MCG/ACT inhaler Inhale 2 puffs into the lungs every 4 (four) hours as needed for wheezing or shortness of breath. 1 Inhaler 3  . allopurinol (ZYLOPRIM) 300 MG tablet TAKE (1) TABLET BY MOUTH ONCE DAILY. 90 tablet 0  . amLODipine (NORVASC) 5 MG tablet Take 5 mg by mouth daily.    Marland Kitchen aspirin EC 81 MG tablet Take 1 tablet (81 mg  total) by mouth daily. 30 tablet 11  . bumetanide (BUMEX) 0.5 MG tablet Take 0.5 mg by mouth daily.     . carvedilol (COREG) 25 MG tablet Take 1 tablet (25 mg total) by mouth 2 (two) times daily.    . ciprofloxacin (CIPRO) 500 MG tablet Take 1 tablet (500 mg total) by mouth 2 (two) times daily. 6 tablet 0  . diazepam (VALIUM) 5 MG tablet Take 1 tablet (5 mg total) by mouth at bedtime. 30 tablet 2  . Docosanol 10 % CREA Apply 1 application topically daily as needed (for nose).     Marland Kitchen docusate sodium (COLACE) 100 MG capsule Take 100 mg by mouth daily.     Marland Kitchen erythromycin ophthalmic ointment Place 1 application into the left eye at bedtime. 3.5 g 0  . escitalopram (LEXAPRO) 10 MG tablet Take 1/2 tab p.o. for 7 days then increase to 1 tab daily. 90 tablet 0  . fluconazole (DIFLUCAN) 150 MG tablet Take 1 tablet today and repeat in 3 days 2 tablet 1  . fluticasone (FLONASE) 50 MCG/ACT nasal spray INSTILL 2 SPRAYS INTO BOTH NOSTRILS DAILY 16 g 0  . losartan (COZAAR) 100 MG tablet Take 100 mg by mouth daily.    . magnesium oxide (MAG-OX) 400 (241.3 Mg) MG tablet Take 1 tablet (400 mg total) by mouth 3 (three) times daily. 90 tablet 3  . metFORMIN (GLUCOPHAGE) 500 MG tablet TAKE 1 TABLET BY MOUTH ONCE  DAILY WITH BREAKFAST (Patient taking differently: Take 1,000 mg by mouth 2 (two) times daily with a meal.) 90 tablet 0  . metroNIDAZOLE (METROGEL VAGINAL) 0.75 % vaginal gel Nightly x 5 nights 70 g 0  . ondansetron (ZOFRAN) 8 MG tablet TAKE 1 TABLET BY MOUTH EVERY 8 HOURS AS NEEDED. 30 tablet 6  . pantoprazole (PROTONIX) 40 MG tablet TAKE (1) TABLET BY MOUTH ONCE DAILY. 90 tablet 0  . pregabalin (LYRICA) 200 MG capsule TAKE (1) CAPSULE BY MOUTH TWICE DAILY. 60 capsule 3  . prochlorperazine (COMPAZINE) 10 MG tablet TAKE 1 TABLET BY MOUTH EVERY 6 HOURS AS NEEDED FOR NAUSEA OR VOMITING. 30 tablet 6  . Propylene Glycol (SYSTANE BALANCE) 0.6 % SOLN Apply 1 drop to eye daily as needed (dry eye).     . rosuvastatin (CRESTOR) 5 MG tablet Take 5 mg by mouth daily.    . TRULICITY 1.5 TJ/0.3ES SOPN Inject 0.75 mg into the skin every Monday.     . valACYclovir (VALTREX) 1000 MG tablet TAKE 2 TABLETS BY MOUTH NOW; THEN 2 12 HOURS LATER. 12 tablet 6   No current facility-administered medications for this visit.    ALLERGIES:  No Known Allergies  PHYSICAL EXAM:  Performance status (ECOG): 1 - Symptomatic but completely ambulatory  There were no vitals filed for this visit. Wt Readings from Last 3 Encounters:  11/16/20 252 lb (114.3 kg)  11/15/20 253 lb 8 oz (115 kg)  11/10/20 251 lb (113.9 kg)   Physical Exam Vitals reviewed.  Constitutional:      Appearance: Normal appearance.  Cardiovascular:     Rate and Rhythm: Normal rate and regular rhythm.     Pulses: Normal pulses.     Heart sounds: Normal heart sounds.  Pulmonary:     Effort: Pulmonary effort is normal.     Breath sounds: Normal breath sounds.  Musculoskeletal:     Right lower leg: No edema.     Left lower leg: No edema.  Neurological:     General:  No focal deficit present.     Mental Status: She is alert and oriented to person, place, and time.  Psychiatric:        Mood and Affect: Mood normal.        Behavior: Behavior  normal.     LABORATORY DATA:  I have reviewed the labs as listed.  CBC Latest Ref Rng & Units 11/16/2020 11/02/2020 10/19/2020  WBC 4.0 - 10.5 K/uL 4.8 4.2 3.7(L)  Hemoglobin 12.0 - 15.0 g/dL 11.8(L) 12.3 11.5(L)  Hematocrit 36.0 - 46.0 % 36.9 37.9 35.9(L)  Platelets 150 - 400 K/uL 167 198 156   CMP Latest Ref Rng & Units 11/16/2020 11/02/2020 10/19/2020  Glucose 70 - 99 mg/dL 99 113(H) 106(H)  BUN 6 - 20 mg/dL 24(H) 21(H) 22(H)  Creatinine 0.44 - 1.00 mg/dL 1.00 1.06(H) 1.03(H)  Sodium 135 - 145 mmol/L 139 137 139  Potassium 3.5 - 5.1 mmol/L 4.0 4.1 3.8  Chloride 98 - 111 mmol/L 105 101 103  CO2 22 - 32 mmol/L $RemoveB'25 23 25  'ecHmVnkX$ Calcium 8.9 - 10.3 mg/dL 9.2 9.7 9.1  Total Protein 6.5 - 8.1 g/dL 6.7 7.4 6.6  Total Bilirubin 0.3 - 1.2 mg/dL 0.8 0.9 0.8  Alkaline Phos 38 - 126 U/L 57 63 55  AST 15 - 41 U/L $Remo'19 21 19  'MHJDU$ ALT 0 - 44 U/L $Remo'13 13 15    'eAYJu$ DIAGNOSTIC IMAGING:  I have independently reviewed the scans and discussed with the patient. No results found.   ASSESSMENT:  1. IgG lambda multiple myeloma, stage III, del 17 p: -4 cycles of RVD from 11/01/2017 through 01/14/2018. -Stem cell transplant on 02/20/2018 at [facility]. -Maintenance Velcade every 2 weeks and Revlimid 10 mg 3 weeks on/1 week off started on 07/29/2018. -Myeloma panel from 12/08/2018 shows M spike not observed. Kappa light chains of 32.1 with ratio of 1.64. Immunofixation was negative. -She hadCVAon 12/26/2019 with aphasia. MRI of the brain on 12/27/2019 shows acute ischemic nonhemorrhagic left MCA territory infarct involving left parietal lobe, corresponding with perfusion deficit on CT scan angiogram. No associated hemorrhage or mass-effect. Additional few scattered punctate acute ischemic nonhemorrhagic infarcts involving bilateral frontal and parietal lobes as well as left cerebellum. -Last Velcade treatment was on 12/22/2019. We have held Revlimid since then.  2. CVA with aphasia: -We held her myeloma treatments since CVA with  aphasia on 12/26/2019. -Her aphasia is improving.   PLAN:  1. IgG lambda multiple myeloma, stage III, del 17 p: -Reviewed myeloma panel from 11/02/2020.  M spike is negative. - Free light chain ratio is 1.36.  Immunofixation was normal. - She is tolerating Velcade every 2 weeks reasonably well. - We reviewed her labs today.  White count is 2.8 with ANC of 1.4.  LFTs are normal. - We will follow-up on myeloma panel from today. - RTC 3 months with labs.  2. Hypomagnesemia: -Continue magnesium 400 mg 3 times daily.  3. Bone strengthening agents: -Continue Zometa every 12 weeks.  4. Peripheral neuropathy: -Continue Lyrica 200 mg twice daily.  5. ID prophylaxis: -Continue acyclovir twice daily.  6. Diabetes: -Continue metformin and Trulicity.  7.  Recurrent UTIs: - She had close to 6 UTIs in the last 6 months. - Recent cultures reviewed by me showed E. coli. - We will start her on Macrobid 100 mg daily for prophylaxis.   Orders placed this encounter:  No orders of the defined types were placed in this encounter.    Derek Jack, MD [facility] [phone]   I, Thana Ates, am acting as a Education administrator for Dr. Derek Jack.  I, Derek Jack MD, have reviewed the above documentation for accuracy and completeness, and I agree with the above.

## 2020-11-30 ENCOUNTER — Other Ambulatory Visit (HOSPITAL_COMMUNITY): Payer: Medicare Other

## 2020-11-30 ENCOUNTER — Other Ambulatory Visit: Payer: Self-pay

## 2020-11-30 ENCOUNTER — Inpatient Hospital Stay (HOSPITAL_COMMUNITY): Payer: BC Managed Care – PPO

## 2020-11-30 ENCOUNTER — Ambulatory Visit (HOSPITAL_COMMUNITY): Payer: Medicare Other

## 2020-11-30 ENCOUNTER — Inpatient Hospital Stay (HOSPITAL_BASED_OUTPATIENT_CLINIC_OR_DEPARTMENT_OTHER): Payer: BC Managed Care – PPO | Admitting: Hematology

## 2020-11-30 VITALS — BP 145/97 | HR 64 | Temp 96.9°F | Resp 18 | Wt 251.5 lb

## 2020-11-30 DIAGNOSIS — Z5112 Encounter for antineoplastic immunotherapy: Secondary | ICD-10-CM | POA: Diagnosis not present

## 2020-11-30 DIAGNOSIS — C9000 Multiple myeloma not having achieved remission: Secondary | ICD-10-CM

## 2020-11-30 LAB — CBC WITH DIFFERENTIAL/PLATELET
Abs Immature Granulocytes: 0.01 10*3/uL (ref 0.00–0.07)
Basophils Absolute: 0 10*3/uL (ref 0.0–0.1)
Basophils Relative: 0 %
Eosinophils Absolute: 0 10*3/uL (ref 0.0–0.5)
Eosinophils Relative: 1 %
HCT: 35.3 % — ABNORMAL LOW (ref 36.0–46.0)
Hemoglobin: 11.5 g/dL — ABNORMAL LOW (ref 12.0–15.0)
Immature Granulocytes: 0 %
Lymphocytes Relative: 32 %
Lymphs Abs: 0.9 10*3/uL (ref 0.7–4.0)
MCH: 31.7 pg (ref 26.0–34.0)
MCHC: 32.6 g/dL (ref 30.0–36.0)
MCV: 97.2 fL (ref 80.0–100.0)
Monocytes Absolute: 0.5 10*3/uL (ref 0.1–1.0)
Monocytes Relative: 17 %
Neutro Abs: 1.4 10*3/uL — ABNORMAL LOW (ref 1.7–7.7)
Neutrophils Relative %: 50 %
Platelets: 148 10*3/uL — ABNORMAL LOW (ref 150–400)
RBC: 3.63 MIL/uL — ABNORMAL LOW (ref 3.87–5.11)
RDW: 14.8 % (ref 11.5–15.5)
WBC: 2.8 10*3/uL — ABNORMAL LOW (ref 4.0–10.5)
nRBC: 0 % (ref 0.0–0.2)

## 2020-11-30 LAB — COMPREHENSIVE METABOLIC PANEL
ALT: 14 U/L (ref 0–44)
AST: 22 U/L (ref 15–41)
Albumin: 4 g/dL (ref 3.5–5.0)
Alkaline Phosphatase: 64 U/L (ref 38–126)
Anion gap: 9 (ref 5–15)
BUN: 21 mg/dL — ABNORMAL HIGH (ref 6–20)
CO2: 25 mmol/L (ref 22–32)
Calcium: 9 mg/dL (ref 8.9–10.3)
Chloride: 105 mmol/L (ref 98–111)
Creatinine, Ser: 1.15 mg/dL — ABNORMAL HIGH (ref 0.44–1.00)
GFR, Estimated: 56 mL/min — ABNORMAL LOW (ref >60)
Glucose, Bld: 93 mg/dL (ref 70–99)
Potassium: 3.8 mmol/L (ref 3.5–5.1)
Sodium: 139 mmol/L (ref 135–145)
Total Bilirubin: 0.6 mg/dL (ref 0.3–1.2)
Total Protein: 6.7 g/dL (ref 6.5–8.1)

## 2020-11-30 MED ORDER — NITROFURANTOIN MONOHYD MACRO 100 MG PO CAPS: 100.0000 mg | ORAL_CAPSULE | Freq: Every day | ORAL | 3 refills | Status: DC

## 2020-11-30 MED ORDER — SODIUM CHLORIDE 0.9 % IV SOLN: Freq: Once | INTRAVENOUS | Status: AC

## 2020-11-30 MED ORDER — BORTEZOMIB CHEMO SQ INJECTION 3.5 MG (2.5MG/ML)
1.3000 mg/m2 | Freq: Once | INTRAMUSCULAR | Status: AC
  Administered 2020-11-30: 3 mg via SUBCUTANEOUS
  Filled 2020-11-30: qty 1.2

## 2020-11-30 MED ORDER — ZOLEDRONIC ACID 4 MG/5ML IV CONC
3.0000 mg | Freq: Once | INTRAVENOUS | Status: AC
  Administered 2020-11-30: 3 mg via INTRAVENOUS
  Filled 2020-11-30: qty 3.75

## 2020-11-30 MED ORDER — ALBUTEROL SULFATE HFA 108 (90 BASE) MCG/ACT IN AERS: 2.0000 | INHALATION_SPRAY | RESPIRATORY_TRACT | 3 refills | Status: DC | PRN

## 2020-11-30 MED ORDER — PROCHLORPERAZINE MALEATE 10 MG PO TABS: 10.0000 mg | ORAL_TABLET | Freq: Once | ORAL | Status: DC

## 2020-11-30 NOTE — Telephone Encounter (Signed)
Pt.notified

## 2020-11-30 NOTE — Patient Instructions (Signed)
[place] at [facility] Discharge Instructions  You were seen today by Dr. Delton Coombes. He went over your recent results, and you received your treatment. Dr. Delton Coombes will see you back in 3 months for labs and follow up.   Thank you for choosing [facility] at [facility] to provide your oncology and hematology care.  To afford each patient quality time with our provider, please arrive at least 15 minutes before your scheduled appointment time.   If you have a lab appointment with the [facility] please come in thru the Main Entrance and check in at the main information desk  You need to re-schedule your appointment should you arrive 10 or more minutes late.  We strive to give you quality time with our providers, and arriving late affects you and other patients whose appointments are after yours.  Also, if you no show three or more times for appointments you may be dismissed from the clinic at the providers discretion.     Again, thank you for choosing [facility].  Our hope is that these requests will decrease the amount of time that you wait before being seen by our physicians.       _____________________________________________________________  Should you have questions after your visit to [facility], please contact our office at [phone] between the hours of 8:00 a.m. and 4:30 p.m.  Voicemails left after 4:00 p.m. will not be returned until the following business day.  For prescription refill requests, have your pharmacy contact our office and allow 72 hours.    Cancer Center Support Programs:   > Cancer Support Group  2nd Tuesday of the month 1pm-2pm, Journey Room

## 2020-11-30 NOTE — Progress Notes (Signed)
Patient has been assessed, vital signs and labs have been reviewed by Dr. Delton Coombes.  The patient is good to proceed with treatment at this time.  Primary RN and pharmacy aware.

## 2020-11-30 NOTE — Progress Notes (Signed)
Patient presents today for treatment and follow up visit with Dr. Delton Coombes. Labs within parameters for treatment and reviewed by Dr. Delton Coombes. Zometa 3 mg IV today per MD every 12 weeks.   Velcade injection and Zometa 3 mg IV given today per MD orders. Tolerated infusion without adverse affects. Vital signs stable. No complaints at this time. Discharged from clinic ambulatory in stable condition. Alert and oriented x 3. F/U with [facility] as scheduled.

## 2020-11-30 NOTE — Telephone Encounter (Signed)
Left message to return call 

## 2020-11-30 NOTE — Telephone Encounter (Signed)
done

## 2020-11-30 NOTE — Patient Instructions (Signed)
[facility]  Discharge Instructions: Thank you for choosing [facility] to provide your oncology and hematology care.  If you have a lab appointment with the [facility], please come in thru the Main Entrance and check in at the main information desk.  We strive to give you quality time with your provider. You may need to reschedule your appointment if you arrive late (15 or more minutes).  Arriving late affects you and other patients whose appointments are after yours.  Also, if you miss three or more appointments without notifying the office, you may be dismissed from the clinic at the provider's discretion.      For prescription refill requests, have your pharmacy contact our office and allow 72 hours for refills to be completed.    Today you received the following chemotherapy and/or immunotherapy agents Velcade injection and Zometa 3 mg .     To help prevent nausea and vomiting after your treatment, we encourage you to take your nausea medication as directed.  BELOW ARE SYMPTOMS THAT SHOULD BE REPORTED IMMEDIATELY: . *FEVER GREATER THAN 100.4 F (38 C) OR HIGHER . *CHILLS OR SWEATING . *NAUSEA AND VOMITING THAT IS NOT CONTROLLED WITH YOUR NAUSEA MEDICATION . *UNUSUAL SHORTNESS OF BREATH . *UNUSUAL BRUISING OR BLEEDING . *URINARY PROBLEMS (pain or burning when urinating, or frequent urination) . *BOWEL PROBLEMS (unusual diarrhea, constipation, pain near the anus) . TENDERNESS IN MOUTH AND THROAT WITH OR WITHOUT PRESENCE OF ULCERS (sore throat, sores in mouth, or a toothache) . UNUSUAL RASH, SWELLING OR PAIN  . UNUSUAL VAGINAL DISCHARGE OR ITCHING   Items with * indicate a potential emergency and should be followed up as soon as possible or go to the Emergency Department if any problems should occur.  Please show the CHEMOTHERAPY ALERT CARD or IMMUNOTHERAPY ALERT CARD at check-in to the Emergency Department and triage nurse.  Should you have questions after  your visit or need to cancel or reschedule your appointment, please contact [facility] [phone]  and follow the prompts.  Office hours are 8:00 a.m. to 4:30 p.m. Monday - Friday. Please note that voicemails left after 4:00 p.m. may not be returned until the following business day.  We are closed weekends and major holidays. You have access to a nurse at all times for urgent questions. Please call the main number to the clinic [phone] and follow the prompts.  For any non-urgent questions, you may also contact your provider using MyChart. We now offer e-Visits for anyone 70 and older to request care online for non-urgent symptoms. For details visit [email or web address].   Also download the MyChart app! Go to the app store, search "MyChart", open the app, select [facility], and log in with your MyChart username and password.  Due to Covid, a mask is required upon entering the hospital/clinic. If you do not have a mask, one will be given to you upon arrival. For doctor visits, patients may have 1 support person aged 59 or older with them. For treatment visits, patients cannot have anyone with them due to current Covid guidelines and our immunocompromised population.

## 2020-12-01 LAB — KAPPA/LAMBDA LIGHT CHAINS
Kappa free light chain: 23.1 mg/L — ABNORMAL HIGH (ref 3.3–19.4)
Kappa, lambda light chain ratio: 1.37 (ref 0.26–1.65)
Lambda free light chains: 16.9 mg/L (ref 5.7–26.3)

## 2020-12-02 LAB — PROTEIN ELECTROPHORESIS, SERUM
A/G Ratio: 1.5 (ref 0.7–1.7)
Albumin ELP: 3.8 g/dL (ref 2.9–4.4)
Alpha-1-Globulin: 0.2 g/dL (ref 0.0–0.4)
Alpha-2-Globulin: 0.7 g/dL (ref 0.4–1.0)
Beta Globulin: 0.9 g/dL (ref 0.7–1.3)
Gamma Globulin: 0.7 g/dL (ref 0.4–1.8)
Globulin, Total: 2.5 g/dL (ref 2.2–3.9)
Total Protein ELP: 6.3 g/dL (ref 6.0–8.5)

## 2020-12-06 LAB — IMMUNOFIXATION ELECTROPHORESIS
IgA: 75 mg/dL — ABNORMAL LOW (ref 87–352)
IgG (Immunoglobin G), Serum: 734 mg/dL (ref 586–1602)
IgM (Immunoglobulin M), Srm: 16 mg/dL — ABNORMAL LOW (ref 26–217)
Total Protein ELP: 6.3 g/dL (ref 6.0–8.5)

## 2020-12-14 ENCOUNTER — Other Ambulatory Visit (HOSPITAL_COMMUNITY): Payer: Self-pay | Admitting: Hematology and Oncology

## 2020-12-14 ENCOUNTER — Inpatient Hospital Stay (HOSPITAL_COMMUNITY): Payer: BC Managed Care – PPO

## 2020-12-14 ENCOUNTER — Inpatient Hospital Stay (HOSPITAL_COMMUNITY): Payer: BC Managed Care – PPO | Attending: Hematology

## 2020-12-14 ENCOUNTER — Other Ambulatory Visit: Payer: Self-pay

## 2020-12-14 VITALS — BP 117/80 | HR 66 | Temp 96.9°F | Resp 18 | Wt 254.6 lb

## 2020-12-14 DIAGNOSIS — C9000 Multiple myeloma not having achieved remission: Secondary | ICD-10-CM

## 2020-12-14 DIAGNOSIS — Z79899 Other long term (current) drug therapy: Secondary | ICD-10-CM | POA: Diagnosis not present

## 2020-12-14 DIAGNOSIS — Z5112 Encounter for antineoplastic immunotherapy: Secondary | ICD-10-CM | POA: Insufficient documentation

## 2020-12-14 LAB — CBC WITH DIFFERENTIAL/PLATELET
Abs Immature Granulocytes: 0 10*3/uL (ref 0.00–0.07)
Basophils Absolute: 0 10*3/uL (ref 0.0–0.1)
Basophils Relative: 1 %
Eosinophils Absolute: 0 10*3/uL (ref 0.0–0.5)
Eosinophils Relative: 1 %
HCT: 34.7 % — ABNORMAL LOW (ref 36.0–46.0)
Hemoglobin: 11.5 g/dL — ABNORMAL LOW (ref 12.0–15.0)
Immature Granulocytes: 0 %
Lymphocytes Relative: 31 %
Lymphs Abs: 1.1 10*3/uL (ref 0.7–4.0)
MCH: 31.9 pg (ref 26.0–34.0)
MCHC: 33.1 g/dL (ref 30.0–36.0)
MCV: 96.4 fL (ref 80.0–100.0)
Monocytes Absolute: 0.4 10*3/uL (ref 0.1–1.0)
Monocytes Relative: 10 %
Neutro Abs: 2 10*3/uL (ref 1.7–7.7)
Neutrophils Relative %: 57 %
Platelets: 164 10*3/uL (ref 150–400)
RBC: 3.6 MIL/uL — ABNORMAL LOW (ref 3.87–5.11)
RDW: 14.7 % (ref 11.5–15.5)
WBC: 3.5 10*3/uL — ABNORMAL LOW (ref 4.0–10.5)
nRBC: 0 % (ref 0.0–0.2)

## 2020-12-14 LAB — COMPREHENSIVE METABOLIC PANEL
ALT: 14 U/L (ref 0–44)
AST: 20 U/L (ref 15–41)
Albumin: 3.8 g/dL (ref 3.5–5.0)
Alkaline Phosphatase: 69 U/L (ref 38–126)
Anion gap: 8 (ref 5–15)
BUN: 23 mg/dL — ABNORMAL HIGH (ref 6–20)
CO2: 24 mmol/L (ref 22–32)
Calcium: 9.1 mg/dL (ref 8.9–10.3)
Chloride: 105 mmol/L (ref 98–111)
Creatinine, Ser: 1 mg/dL (ref 0.44–1.00)
GFR, Estimated: >60 mL/min (ref >60)
Glucose, Bld: 118 mg/dL — ABNORMAL HIGH (ref 70–99)
Potassium: 3.6 mmol/L (ref 3.5–5.1)
Sodium: 137 mmol/L (ref 135–145)
Total Bilirubin: 0.6 mg/dL (ref 0.3–1.2)
Total Protein: 6.5 g/dL (ref 6.5–8.1)

## 2020-12-14 MED ORDER — PROCHLORPERAZINE MALEATE 10 MG PO TABS: 10.0000 mg | ORAL_TABLET | Freq: Once | ORAL | Status: DC

## 2020-12-14 MED ORDER — BORTEZOMIB CHEMO SQ INJECTION 3.5 MG (2.5MG/ML)
1.3000 mg/m2 | Freq: Once | INTRAMUSCULAR | Status: AC
  Administered 2020-12-14: 3 mg via SUBCUTANEOUS
  Filled 2020-12-14: qty 1.2

## 2020-12-14 NOTE — Patient Instructions (Signed)
[facility]  Discharge Instructions: Thank you for choosing [facility] to provide your oncology and hematology care.  If you have a lab appointment with the [facility], please come in thru the Main Entrance and check in at the main information desk.  Wear comfortable clothing and clothing appropriate for easy access to any Portacath or PICC line.   We strive to give you quality time with your provider. You may need to reschedule your appointment if you arrive late (15 or more minutes).  Arriving late affects you and other patients whose appointments are after yours.  Also, if you miss three or more appointments without notifying the office, you may be dismissed from the clinic at the provider's discretion.      For prescription refill requests, have your pharmacy contact our office and allow 72 hours for refills to be completed.    Today you received the following chemotherapy and/or immunotherapy agents velcade injection     To help prevent nausea and vomiting after your treatment, we encourage you to take your nausea medication as directed.  BELOW ARE SYMPTOMS THAT SHOULD BE REPORTED IMMEDIATELY: . *FEVER GREATER THAN 100.4 F (38 C) OR HIGHER . *CHILLS OR SWEATING . *NAUSEA AND VOMITING THAT IS NOT CONTROLLED WITH YOUR NAUSEA MEDICATION . *UNUSUAL SHORTNESS OF BREATH . *UNUSUAL BRUISING OR BLEEDING . *URINARY PROBLEMS (pain or burning when urinating, or frequent urination) . *BOWEL PROBLEMS (unusual diarrhea, constipation, pain near the anus) . TENDERNESS IN MOUTH AND THROAT WITH OR WITHOUT PRESENCE OF ULCERS (sore throat, sores in mouth, or a toothache) . UNUSUAL RASH, SWELLING OR PAIN  . UNUSUAL VAGINAL DISCHARGE OR ITCHING   Items with * indicate a potential emergency and should be followed up as soon as possible or go to the Emergency Department if any problems should occur.  Please show the CHEMOTHERAPY ALERT CARD or IMMUNOTHERAPY ALERT CARD at  check-in to the Emergency Department and triage nurse.  Should you have questions after your visit or need to cancel or reschedule your appointment, please contact [facility] [phone]  and follow the prompts.  Office hours are 8:00 a.m. to 4:30 p.m. Monday - Friday. Please note that voicemails left after 4:00 p.m. may not be returned until the following business day.  We are closed weekends and major holidays. You have access to a nurse at all times for urgent questions. Please call the main number to the clinic [phone] and follow the prompts.  For any non-urgent questions, you may also contact your provider using MyChart. We now offer e-Visits for anyone 82 and older to request care online for non-urgent symptoms. For details visit [email or web address].   Also download the MyChart app! Go to the app store, search "MyChart", open the app, select [facility], and log in with your MyChart username and password.  Due to Covid, a mask is required upon entering the hospital/clinic. If you do not have a mask, one will be given to you upon arrival. For doctor visits, patients may have 1 support person aged 3 or older with them. For treatment visits, patients cannot have anyone with them due to current Covid guidelines and our immunocompromised population.

## 2020-12-14 NOTE — Progress Notes (Signed)
Patient presents today for Velcade injection per MD orders.  Vital signs within parameters for treatment.  Labs pending.  Patient has no new complaints st this time.  Labs within parameters for treatment.  Patient takes premedication at home.    Stable during Velcade administration without incident; injection site WNL; see MAR for injection details.  Patient tolerated procedure well and without incident.  No questions or complaints noted at this time.  Discharge from clinic ambulatory in stable condition.  Alert and oriented X 3.  Follow up with [facility] as scheduled.

## 2020-12-15 LAB — PROTEIN ELECTROPHORESIS, SERUM
A/G Ratio: 1.5 (ref 0.7–1.7)
Albumin ELP: 3.8 g/dL (ref 2.9–4.4)
Alpha-1-Globulin: 0.2 g/dL (ref 0.0–0.4)
Alpha-2-Globulin: 0.7 g/dL (ref 0.4–1.0)
Beta Globulin: 0.9 g/dL (ref 0.7–1.3)
Gamma Globulin: 0.7 g/dL (ref 0.4–1.8)
Globulin, Total: 2.5 g/dL (ref 2.2–3.9)
Total Protein ELP: 6.3 g/dL (ref 6.0–8.5)

## 2020-12-15 LAB — KAPPA/LAMBDA LIGHT CHAINS
Kappa free light chain: 23 mg/L — ABNORMAL HIGH (ref 3.3–19.4)
Kappa, lambda light chain ratio: 1.34 (ref 0.26–1.65)
Lambda free light chains: 17.2 mg/L (ref 5.7–26.3)

## 2020-12-16 ENCOUNTER — Ambulatory Visit: Payer: Medicare Other | Admitting: Obstetrics & Gynecology

## 2020-12-16 ENCOUNTER — Other Ambulatory Visit: Payer: Medicare Other

## 2020-12-19 ENCOUNTER — Encounter (HOSPITAL_COMMUNITY): Payer: Self-pay | Admitting: Speech Pathology

## 2020-12-19 ENCOUNTER — Encounter (HOSPITAL_COMMUNITY): Payer: Self-pay | Admitting: Hematology

## 2020-12-19 ENCOUNTER — Ambulatory Visit (HOSPITAL_COMMUNITY): Payer: BC Managed Care – PPO | Attending: Adult Health | Admitting: Speech Pathology

## 2020-12-19 ENCOUNTER — Other Ambulatory Visit: Payer: Self-pay

## 2020-12-19 DIAGNOSIS — R4701 Aphasia: Secondary | ICD-10-CM | POA: Diagnosis not present

## 2020-12-19 LAB — IMMUNOFIXATION ELECTROPHORESIS
IgA: 87 mg/dL (ref 87–352)
IgG (Immunoglobin G), Serum: 774 mg/dL (ref 586–1602)
IgM (Immunoglobulin M), Srm: 23 mg/dL — ABNORMAL LOW (ref 26–217)
Total Protein ELP: 6.2 g/dL (ref 6.0–8.5)

## 2020-12-19 NOTE — Therapy (Signed)
[place], [place], [address] Phone: [phone]   Fax:  [phone]  Speech Language Pathology Evaluation  Patient Details  Name: Joanna Reid MRN: [id number] Date of Birth: Sep 14, 1965 Referring Provider (SLP): Frann Rider, NP   Encounter Date: 12/19/2020   End of Session - 12/19/20 1359     Visit Number 1    Number of Visits 1    Authorization Type [employer], [employer] secondary    SLP Start Time 1030    SLP Stop Time  1115    SLP Time Calculation (min) 45 min    Activity Tolerance Patient tolerated treatment well             Past Medical History:  Diagnosis Date   Acid reflux    Allergic rhinitis    Cancer ([place])    multiple myeloma   Diabetes mellitus    type 2   Gout    Gout    HBP (high blood pressure)    History of kidney stones    Migraines     Past Surgical History:  Procedure Laterality Date   BREAST CYST EXCISION Left    2009 no visible scar on skin   CESAREAN SECTION     COLONOSCOPY WITH PROPOFOL N/A 12/25/2019   Procedure: COLONOSCOPY WITH PROPOFOL;  Surgeon: Rogene Houston, MD;  [facility];  Service: Endoscopy;  Laterality: N/A;  730   EXTRACORPOREAL SHOCK WAVE LITHOTRIPSY Left 10/10/2017   Procedure: LEFT EXTRACORPOREAL SHOCK WAVE LITHOTRIPSY (ESWL);  Surgeon: Bjorn Loser, MD;  Location: [facility];  Service: Urology;  Laterality: Left;   EYE SURGERY     HEMORRHOID SURGERY N/A 11/19/2012   Procedure: HEMORRHOIDECTOMY;  Surgeon: Jamesetta So, MD;  Location: [facility];  Service: General;  Laterality: N/A;   kidney stones  1998   LAPAROSCOPIC UNILATERAL SALPINGO OOPHERECTOMY  05/14/2012   Procedure: LAPAROSCOPIC UNILATERAL SALPINGO OOPHORECTOMY;  Surgeon: [name], MD;  Location: [facility];  Service: Gynecology;  Laterality: Right;  laparoscopic right salpingo-oophorectomy   PARTIAL HYSTERECTOMY     POLYPECTOMY  12/25/2019   Procedure: POLYPECTOMY;  Surgeon: Rogene Houston, MD;  [facility];  Service: Endoscopy;;   TONSILECTOMY, ADENOIDECTOMY, BILATERAL MYRINGOTOMY AND TUBES     VESICOVAGINAL FISTULA CLOSURE W/ TAH      There were no vitals filed for this visit.   Subjective Assessment - 12/19/20 1039     Subjective "I don't think I really need to be here."    Currently in Pain? No/denies                SLP Evaluation [facility] - 12/19/20 1039       SLP Visit Information   SLP Received On 12/19/20    Referring Provider (SLP) Frann Rider, NP    Onset Date 12/26/19    Medical Diagnosis s/p CVA      Subjective   Subjective "I don't really think I need to be here."    Patient/Family Stated Goal None stated      General Information   HPI MRI 12/27/19 acute ischemic nonhemorrhagic left MCA territory infarct involving the left parietal lobe, few scattered punctate acute ischemic nonhemorrhagic infarcts bilateral frontal and parietal lobes as wellas the left cerebellum, small remote right cerebellar infarcts. PMH Essential hypertension,  obesity, Multiple myeloma ([place]), DM, Anemia, COVID. Pt known to this SLP from previous SLP therapy with d/c in August 2021. Pt was seen  by Frann Rider in May and referred back to OP SLP.    Behavioral/Cognition alert and cooperative    Mobility Status ambulatory      Balance Screen   Has the patient fallen in the past 6 months No    Has the patient had a decrease in activity level because of a fear of falling?  No    Is the patient reluctant to leave their home because of a fear of falling?  No      Prior Functional Status   Cognitive/Linguistic Baseline Baseline deficits    Baseline deficit details residual aphasia from stroke last June 2021    Type of Home Mobile home     Lives With Spouse;Family    Available Support Family    Education some college    Vocation On disability      Cognition   Overall Cognitive Status Within Functional Limits for tasks assessed    Memory Appears intact     Awareness Appears intact    Problem Solving Appears intact      Auditory Comprehension   Overall Auditory Comprehension Appears within functional limits for tasks assessed    Yes/No Questions Within Functional Limits    Commands Within Functional Limits    Conversation Complex      Visual Recognition/Discrimination   Discrimination Within Function Limits      Reading Comprehension   Reading Status Within funtional limits   for short paragraphs, requires extra time per Pt     Expression   Primary Mode of Expression Verbal      Verbal Expression   Overall Verbal Expression Appears within functional limits for tasks assessed    Initiation No impairment    Automatic Speech Name;Social Response;Day of week    Level of Generative/Spontaneous Verbalization Conversation    Repetition No impairment    Naming No impairment    Pragmatics No impairment    Non-Verbal Means of Communication Not applicable      Written Expression   Dominant Hand Right    Written Expression Within Functional Limits    Overall Writen Expression WFL per personal/bio, occasional spelling errors and transpositions of numbers      Oral Motor/Sensory Function   Overall Oral Motor/Sensory Function Appears within functional limits for tasks assessed      Motor Speech   Overall Motor Speech Appears within functional limits for tasks assessed    Respiration Within functional limits    Phonation Normal    Resonance Within functional limits    Articulation Within functional limitis    Intelligibility Intelligible    Motor Planning Witnin functional limits    Motor Speech Errors Not applicable    Phonation WFL      Standardized Assessments   Standardized Assessments  --   BNT short               SLP Education - 12/19/20 1342     Education Details No treatment recommended, continue to practice writing at home    Person(s) Educated Patient    Methods Explanation    Comprehension Verbalized  understanding                  Plan - 12/19/20 1403     Clinical Impression Statement Pt well known to this SLP from previous outpatient SLP therapy with discharge August 2021. Pt reassessed this date following another outpatient SLP referral from Frann Rider, NP. Pt actually demonstrates continued improvement in her aphasia since her discharge.  [place] Naming Test short form administered and Pt scored 14/15, named 20 animals in one minute, wrote a 7 sentence paragraph summarizing a short story, followed auditory directions WNL, verbally expressed herself fluently in conversation, wrote to dictation after desribing pictures, and wrote phone numbers with one error (transposed two numbers). Pt acknowledges that her speech tends to deteriorate when she feels anxious (doctors' office, at [facility]) and when she is fatigued. This is to be expected and she compensates well by having family assist as needed. SLP encouraged Pt to copy addresses from an address book and check over her writing several times (writing addresses and phone numbers). She does not feel that she needs SLP services at this time and SLP is in agreement. Pt is hoping to return to some sort of work or volunteering. No further SLP services indicated at this time.    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  12/26/2019   H/o COVID-19--- was Positive 09/17/2019, Negative 12/23/19 AND also Neg on  12/26/19 12/26/2019   Acute bronchiolitis due to respiratory syncytial virus (RSV) 08/11/2018   Hypomagnesemia 08/10/2018    Hyperlipidemia 08/09/2018   Anemia 08/09/2018   Leukopenia 08/09/2018   Hypokalemia 08/09/2018   Hypocalcemia 08/09/2018   Lactic acidosis 08/09/2018   Sepsis ([place]) 04/25/2018   Goals of care, counseling/discussion 11/01/2017   Multiple myeloma ([place]) 10/29/2017   Hypercalcemia 10/29/2017   Type 2 diabetes mellitus without complication, without long-term current use of insulin ([place]) 01/27/2016   Morbid obesity due to excess calories ([place]) 01/27/2016   Anxiety as acute reaction to exceptional stress 01/13/2015   Attention deficit hyperactivity disorder (ADHD), 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   Gout 01/20/2014   GERD (gastroesophageal reflux disease) 01/20/2014   Essential hypertension, benign 01/20/2014   Plantar fasciitis of left foot 09/19/2011   Thank you,  Genene Churn, [place]  Rupinder Livingston 12/19/2020, 2:11 PM  [place], [place], [address] Phone: [phone]   Fax:  [phone]  Name: CLARISA DANSER MRN: [id number] Date of Birth: [birth year]

## 2020-12-20 ENCOUNTER — Other Ambulatory Visit: Payer: Medicare Other

## 2020-12-20 ENCOUNTER — Ambulatory Visit (INDEPENDENT_AMBULATORY_CARE_PROVIDER_SITE_OTHER): Payer: BC Managed Care – PPO | Admitting: Obstetrics & Gynecology

## 2020-12-20 ENCOUNTER — Encounter: Payer: Self-pay | Admitting: Obstetrics & Gynecology

## 2020-12-20 ENCOUNTER — Ambulatory Visit (INDEPENDENT_AMBULATORY_CARE_PROVIDER_SITE_OTHER): Payer: BC Managed Care – PPO

## 2020-12-20 VITALS — BP 142/99 | HR 84 | Ht 64.0 in | Wt 260.0 lb

## 2020-12-20 DIAGNOSIS — R1032 Left lower quadrant pain: Secondary | ICD-10-CM | POA: Diagnosis not present

## 2020-12-20 DIAGNOSIS — I63412 Cerebral infarction due to embolism of left middle cerebral artery: Secondary | ICD-10-CM

## 2020-12-20 NOTE — Progress Notes (Signed)
PELVIC US TA/TV: normal vaginal cuff,right oophorectomy,right adnexa WNL,left ovary appears to be mobile,no pain during ultrasound,left ovarian echogenic mass (? dermoid) 6 x 6 x 6 mm,no free fluid  Chaperone Peggy

## 2020-12-20 NOTE — Progress Notes (Signed)
Follow up appointment for results  Chief Complaint  Patient presents with   Follow-up    [name] today    Blood pressure (!) 142/99, pulse 84, height _0  (1.626 m), weight 260 lb (117.9 kg).  US PELVIS TRANSVAGINAL NON-OB (TV ONLY)  Result Date: 12/20/2020 Images from the original result were not included.  Center for [facility] @ [facility] [address] [place] [address]                                                                                                                                   GYNECOLOGIC SONOGRAM Joanna Reid is a 55 y.o. [phone] No LMP recorded. Patient has had a hysterectomy. She is for a pelvic sonogram for LLQ PAIN. Uterus                      Normal vagina cuff Endometrium          N/A Right ovary             Surgically removed Left ovary                3.5 x 1.2 x 2 cm,  echogenic mass (? dermoid) 6 x 6 x 6 mm No free fluid Technician Comments: PELVIC US TA/TV: normal vaginal cuff,right oophorectomy,right adnexa WNL,left ovary appears to be mobile,no pain during ultrasound,left ovarian echogenic mass (? dermoid) 6 x 6 x 6 mm,no free fluid Chaperone [address] [name] 12/20/2020 10:30 AM Clinical Impression and recommendations: I have reviewed the sonogram results above, combined with the patient's current clinical course, below are my impressions and any appropriate recommendations for management based on the sonographic findings. Uterus, cervix and right ovary are surgically absent The left ovary is small consistent with age and menopausal state and does move well, which precludes adhesions of the ovary as the source of pain, small clinically irrelevant calcification/hyperdensity ?benign cystic teratoma, does not following [name] 12/20/2020 10:46 AM .   US PELVIS (TRANSABDOMINAL ONLY)  Result Date: 12/20/2020 Images from the original result were not included.  Center for [facility] @ [facility] [address] [place] [address]  GYNECOLOGIC SONOGRAM Joanna Reid is a 55 y.o. [id number] No LMP recorded. Patient has had a hysterectomy. She is for a pelvic sonogram for LLQ PAIN. Uterus                      Normal vagina cuff Endometrium          N/A Right ovary             Surgically removed Left ovary                3.5 x 1.2 x 2 cm,  echogenic mass (? dermoid) 6 x 6 x 6 mm No free fluid Technician Comments: PELVIC US TA/TV: normal vaginal cuff,right oophorectomy,right adnexa WNL,left ovary appears to be mobile,no pain during ultrasound,left ovarian echogenic mass (? dermoid) 6 x 6 x 6 mm,no free fluid Chaperone [address] [name] 12/20/2020 10:30 AM Clinical Impression and recommendations: I have reviewed the sonogram results above, combined with the patient's current clinical course, below are my impressions and any appropriate recommendations for management based on the sonographic findings. Uterus, cervix and right ovary are surgically absent The left ovary is small consistent with age and menopausal state and does move well, which precludes adhesions of the ovary as the source of pain, small clinically irrelevant calcification/hyperdensity ?benign cystic teratoma, does not following [name] 12/20/2020 10:46 AM .      MEDS ordered this encounter: No orders of the defined types were placed in this encounter.   Orders for this encounter: No orders of the defined types were placed in this encounter.   Impression:   ICD-10-CM   1. LLQ pain  R10.32    pelvic sonogram is normal today, no gyn source of pain, no follow up needed for sonogram       Plan: No further follow up in needed  Follow Up: No follow-ups on file.      All questions were answered.  Past Medical History:  Diagnosis Date   Acid reflux    Allergic rhinitis    Cancer ([facility])    multiple myeloma   Diabetes  mellitus    type 2   Gout    Gout    HBP (high blood pressure)    History of kidney stones    Migraines     Past Surgical History:  Procedure Laterality Date   BREAST CYST EXCISION Left    2009 no visible scar on skin   CESAREAN SECTION     COLONOSCOPY WITH PROPOFOL N/A 12/25/2019   Procedure: COLONOSCOPY WITH PROPOFOL;  Surgeon: Rogene Houston, MD;  [facility];  Service: Endoscopy;  Laterality: N/A;  730   EXTRACORPOREAL SHOCK WAVE LITHOTRIPSY Left 10/10/2017   Procedure: LEFT EXTRACORPOREAL SHOCK WAVE LITHOTRIPSY (ESWL);  Surgeon: Bjorn Loser, MD;  Location[facility];  Service: Urology;  Laterality: Left;   EYE SURGERY     HEMORRHOID SURGERY N/A 11/19/2012   Procedure: HEMORRHOIDECTOMY;  Surgeon: Jamesetta So, MD;  Location: [facility];  Service: General;  Laterality: N/A;   kidney stones  1998   LAPAROSCOPIC UNILATERAL SALPINGO OOPHERECTOMY  05/14/2012   Procedure: LAPAROSCOPIC UNILATERAL SALPINGO OOPHORECTOMY;  Surgeon: [name], MD;  Location: [facility];  Service: Gynecology;  Laterality: Right;  laparoscopic right salpingo-oophorectomy   PARTIAL HYSTERECTOMY     POLYPECTOMY  12/25/2019   Procedure: POLYPECTOMY;  Surgeon: Rogene Houston, MD;  [facility];  Service: Endoscopy;;   TONSILECTOMY,  ADENOIDECTOMY, BILATERAL MYRINGOTOMY AND TUBES     VESICOVAGINAL FISTULA CLOSURE W/ TAH      OB History     Gravida  2   Para  1   Term      Preterm  1   AB  1   Living  1      SAB  1   IAB      Ectopic      Multiple      Live Births  1           No Known Allergies  Social History   Socioeconomic History   Marital status: Married    Spouse name: Not on file   Number of children: Not on file   Years of education: 12   Highest education level: Not on file  Occupational History    Employer: [employer]  Tobacco Use   Smoking status: Former    Pack years: 0.00    Types: Cigarettes    Quit date: 02/27/1999    Years since quitting:  21.8   Smokeless tobacco: Never   Tobacco comments:    socially   Vaping Use   Vaping Use: Never used  Substance and Sexual Activity   Alcohol use: No    Alcohol/week: 0.0 standard drinks   Drug use: No   Sexual activity: Yes    Birth control/protection: Surgical    Comment: hyst  Other Topics Concern   Not on file  Social History Narrative   Not on file   Social Determinants of Health   Financial Resource Strain: Not on file  Food Insecurity: Not on file  Transportation Needs: No Transportation Needs   Lack of Transportation (Medical): No   Lack of Transportation (Non-Medical): No  Physical Activity: Not on file  Stress: Not on file  Social Connections: Not on file    Family History  Problem Relation Age of Onset   Arthritis Other    Cancer Other    Diabetes Other    Hypertension Mother    Dementia Mother    Diabetes Father    ALS Father    Diabetes Brother    Hypertension Brother    Cancer Paternal Aunt    COPD Maternal Grandmother    Cancer Maternal Grandfather    Anesthesia problems Paternal Grandfather

## 2020-12-27 ENCOUNTER — Other Ambulatory Visit (HOSPITAL_COMMUNITY): Payer: Self-pay | Admitting: Hematology

## 2020-12-27 DIAGNOSIS — C9000 Multiple myeloma not having achieved remission: Secondary | ICD-10-CM

## 2020-12-28 ENCOUNTER — Encounter (HOSPITAL_COMMUNITY): Payer: Self-pay

## 2020-12-28 ENCOUNTER — Encounter (HOSPITAL_COMMUNITY): Payer: Self-pay | Admitting: Hematology

## 2020-12-28 ENCOUNTER — Telehealth (HOSPITAL_COMMUNITY): Payer: Self-pay | Admitting: Hematology and Oncology

## 2020-12-28 ENCOUNTER — Other Ambulatory Visit: Payer: Self-pay

## 2020-12-28 ENCOUNTER — Other Ambulatory Visit (HOSPITAL_COMMUNITY): Payer: Self-pay | Admitting: Hematology and Oncology

## 2020-12-28 ENCOUNTER — Inpatient Hospital Stay (HOSPITAL_COMMUNITY): Payer: BC Managed Care – PPO

## 2020-12-28 VITALS — BP 126/82 | HR 66 | Temp 97.0°F | Resp 18 | Wt 260.0 lb

## 2020-12-28 DIAGNOSIS — C9000 Multiple myeloma not having achieved remission: Secondary | ICD-10-CM

## 2020-12-28 DIAGNOSIS — Z5112 Encounter for antineoplastic immunotherapy: Secondary | ICD-10-CM | POA: Diagnosis not present

## 2020-12-28 LAB — COMPREHENSIVE METABOLIC PANEL
ALT: 17 U/L (ref 0–44)
AST: 21 U/L (ref 15–41)
Albumin: 3.9 g/dL (ref 3.5–5.0)
Alkaline Phosphatase: 64 U/L (ref 38–126)
Anion gap: 10 (ref 5–15)
BUN: 22 mg/dL — ABNORMAL HIGH (ref 6–20)
CO2: 23 mmol/L (ref 22–32)
Calcium: 9 mg/dL (ref 8.9–10.3)
Chloride: 105 mmol/L (ref 98–111)
Creatinine, Ser: 0.95 mg/dL (ref 0.44–1.00)
GFR, Estimated: >60 mL/min (ref >60)
Glucose, Bld: 106 mg/dL — ABNORMAL HIGH (ref 70–99)
Potassium: 3.9 mmol/L (ref 3.5–5.1)
Sodium: 138 mmol/L (ref 135–145)
Total Bilirubin: 0.7 mg/dL (ref 0.3–1.2)
Total Protein: 6.6 g/dL (ref 6.5–8.1)

## 2020-12-28 LAB — MAGNESIUM: Magnesium: 1.8 mg/dL (ref 1.7–2.4)

## 2020-12-28 LAB — CBC WITH DIFFERENTIAL/PLATELET
Abs Immature Granulocytes: 0.01 10*3/uL (ref 0.00–0.07)
Basophils Absolute: 0 10*3/uL (ref 0.0–0.1)
Basophils Relative: 1 %
Eosinophils Absolute: 0 10*3/uL (ref 0.0–0.5)
Eosinophils Relative: 1 %
HCT: 35.4 % — ABNORMAL LOW (ref 36.0–46.0)
Hemoglobin: 11.3 g/dL — ABNORMAL LOW (ref 12.0–15.0)
Immature Granulocytes: 0 %
Lymphocytes Relative: 23 %
Lymphs Abs: 1 10*3/uL (ref 0.7–4.0)
MCH: 31.4 pg (ref 26.0–34.0)
MCHC: 31.9 g/dL (ref 30.0–36.0)
MCV: 98.3 fL (ref 80.0–100.0)
Monocytes Absolute: 0.5 10*3/uL (ref 0.1–1.0)
Monocytes Relative: 12 %
Neutro Abs: 2.6 10*3/uL (ref 1.7–7.7)
Neutrophils Relative %: 63 %
Platelets: 174 10*3/uL (ref 150–400)
RBC: 3.6 MIL/uL — ABNORMAL LOW (ref 3.87–5.11)
RDW: 15.3 % (ref 11.5–15.5)
WBC: 4.1 10*3/uL (ref 4.0–10.5)
nRBC: 0 % (ref 0.0–0.2)

## 2020-12-28 MED ORDER — ACYCLOVIR 400 MG PO TABS: 400.0000 mg | ORAL_TABLET | Freq: Two times a day (BID) | ORAL | 3 refills | Status: DC

## 2020-12-28 MED ORDER — BORTEZOMIB CHEMO SQ INJECTION 3.5 MG (2.5MG/ML)
1.0400 mg/m2 | Freq: Once | INTRAMUSCULAR | Status: AC
  Administered 2020-12-28: 2.5 mg via SUBCUTANEOUS
  Filled 2020-12-28: qty 1

## 2020-12-28 NOTE — Patient Instructions (Signed)
You received your velcade injection today.  We decreased the dose based on the side effects of numbness in your toes/feet.  Please know that this should not effect your multiple myeloma.  We will continue to monitor your labs and how you are feeling.  You will see [name] again in 2 weeks.    Your acyclovir is already at the pharmacy.

## 2020-12-28 NOTE — Telephone Encounter (Signed)
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

## 2020-12-28 NOTE — Progress Notes (Signed)
Patient is here today for her maintenance velcade.  She gets her injections every 14 days and her last injection was on 12/14/20.  She reports overall doing very well.  She is in good spirits today.  She states that she has no nausea/vomiting/diarrhea.  She took her compazine this morning at 9am.  She reports that she has numbness in her toes.  No tingling or prickling feelings, just numb.  This is worse than before.  She states that she is having some difficulty walking without shoes on, in fear she will trip and fall.   I spoke with Dr. Alvy Bimler, in clinic today, and she will dose reduce patient to prevent these side effects.  Patient's labs reviewed, she is okay to proceed with treatment today.    She tolerated injection without incidence and remained stable.  She was discharged ambulatory and in stable condition.  She is aware of her next appointments.

## 2020-12-28 NOTE — Telephone Encounter (Signed)
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

## 2020-12-30 ENCOUNTER — Encounter (HOSPITAL_COMMUNITY): Payer: Self-pay | Admitting: Hematology

## 2021-01-02 ENCOUNTER — Other Ambulatory Visit (HOSPITAL_COMMUNITY): Payer: Self-pay | Admitting: Family Medicine

## 2021-01-02 DIAGNOSIS — Z1231 Encounter for screening mammogram for malignant neoplasm of breast: Secondary | ICD-10-CM

## 2021-01-10 ENCOUNTER — Other Ambulatory Visit (HOSPITAL_COMMUNITY): Payer: Self-pay | Admitting: Hematology

## 2021-01-11 ENCOUNTER — Other Ambulatory Visit: Payer: Self-pay

## 2021-01-11 ENCOUNTER — Inpatient Hospital Stay (HOSPITAL_COMMUNITY): Payer: BC Managed Care – PPO

## 2021-01-11 ENCOUNTER — Inpatient Hospital Stay (HOSPITAL_COMMUNITY): Payer: BC Managed Care – PPO | Attending: Hematology

## 2021-01-11 VITALS — BP 122/76 | HR 67 | Temp 98.1°F | Resp 18 | Wt 261.0 lb

## 2021-01-11 DIAGNOSIS — Z5112 Encounter for antineoplastic immunotherapy: Secondary | ICD-10-CM | POA: Diagnosis present

## 2021-01-11 DIAGNOSIS — Z79899 Other long term (current) drug therapy: Secondary | ICD-10-CM | POA: Insufficient documentation

## 2021-01-11 DIAGNOSIS — C9000 Multiple myeloma not having achieved remission: Secondary | ICD-10-CM | POA: Diagnosis present

## 2021-01-11 LAB — COMPREHENSIVE METABOLIC PANEL
ALT: 14 U/L (ref 0–44)
AST: 23 U/L (ref 15–41)
Albumin: 3.8 g/dL (ref 3.5–5.0)
Alkaline Phosphatase: 67 U/L (ref 38–126)
Anion gap: 10 (ref 5–15)
BUN: 24 mg/dL — ABNORMAL HIGH (ref 6–20)
CO2: 25 mmol/L (ref 22–32)
Calcium: 9.2 mg/dL (ref 8.9–10.3)
Chloride: 106 mmol/L (ref 98–111)
Creatinine, Ser: 0.97 mg/dL (ref 0.44–1.00)
GFR, Estimated: >60 mL/min (ref >60)
Glucose, Bld: 193 mg/dL — ABNORMAL HIGH (ref 70–99)
Potassium: 3.8 mmol/L (ref 3.5–5.1)
Sodium: 141 mmol/L (ref 135–145)
Total Bilirubin: 0.6 mg/dL (ref 0.3–1.2)
Total Protein: 6.7 g/dL (ref 6.5–8.1)

## 2021-01-11 LAB — CBC WITH DIFFERENTIAL/PLATELET
Abs Immature Granulocytes: 0.01 10*3/uL (ref 0.00–0.07)
Basophils Absolute: 0 10*3/uL (ref 0.0–0.1)
Basophils Relative: 1 %
Eosinophils Absolute: 0 10*3/uL (ref 0.0–0.5)
Eosinophils Relative: 1 %
HCT: 36.3 % (ref 36.0–46.0)
Hemoglobin: 11.8 g/dL — ABNORMAL LOW (ref 12.0–15.0)
Immature Granulocytes: 0 %
Lymphocytes Relative: 27 %
Lymphs Abs: 1.3 10*3/uL (ref 0.7–4.0)
MCH: 31.9 pg (ref 26.0–34.0)
MCHC: 32.5 g/dL (ref 30.0–36.0)
MCV: 98.1 fL (ref 80.0–100.0)
Monocytes Absolute: 0.4 10*3/uL (ref 0.1–1.0)
Monocytes Relative: 9 %
Neutro Abs: 3 10*3/uL (ref 1.7–7.7)
Neutrophils Relative %: 62 %
Platelets: 169 10*3/uL (ref 150–400)
RBC: 3.7 MIL/uL — ABNORMAL LOW (ref 3.87–5.11)
RDW: 15.3 % (ref 11.5–15.5)
WBC: 4.8 10*3/uL (ref 4.0–10.5)
nRBC: 0 % (ref 0.0–0.2)

## 2021-01-11 LAB — MAGNESIUM: Magnesium: 1.7 mg/dL (ref 1.7–2.4)

## 2021-01-11 MED ORDER — PROCHLORPERAZINE MALEATE 10 MG PO TABS
10.0000 mg | ORAL_TABLET | Freq: Once | ORAL | Status: AC
  Administered 2021-01-11: 10 mg via ORAL
  Filled 2021-01-11: qty 1

## 2021-01-11 MED ORDER — BORTEZOMIB CHEMO SQ INJECTION 3.5 MG (2.5MG/ML)
1.0400 mg/m2 | Freq: Once | INTRAMUSCULAR | Status: AC
  Administered 2021-01-11: 2.5 mg via SUBCUTANEOUS
  Filled 2021-01-11: qty 1

## 2021-01-11 NOTE — Patient Instructions (Signed)
[facility]  Discharge Instructions: Thank you for choosing [facility] to provide your oncology and hematology care.  If you have a lab appointment with the [facility], please come in thru the Main Entrance and check in at the main information desk.  Wear comfortable clothing and clothing appropriate for easy access to any Portacath or PICC line.   We strive to give you quality time with your provider. You may need to reschedule your appointment if you arrive late (15 or more minutes).  Arriving late affects you and other patients whose appointments are after yours.  Also, if you miss three or more appointments without notifying the office, you may be dismissed from the clinic at the provider's discretion.      For prescription refill requests, have your pharmacy contact our office and allow 72 hours for refills to be completed.    Today you received the following chemotherapy and/or immunotherapy agents: Velcade   To help prevent nausea and vomiting after your treatment, we encourage you to take your nausea medication as directed.  BELOW ARE SYMPTOMS THAT SHOULD BE REPORTED IMMEDIATELY: *FEVER GREATER THAN 100.4 F (38 C) OR HIGHER *CHILLS OR SWEATING *NAUSEA AND VOMITING THAT IS NOT CONTROLLED WITH YOUR NAUSEA MEDICATION *UNUSUAL SHORTNESS OF BREATH *UNUSUAL BRUISING OR BLEEDING *URINARY PROBLEMS (pain or burning when urinating, or frequent urination) *BOWEL PROBLEMS (unusual diarrhea, constipation, pain near the anus) TENDERNESS IN MOUTH AND THROAT WITH OR WITHOUT PRESENCE OF ULCERS (sore throat, sores in mouth, or a toothache) UNUSUAL RASH, SWELLING OR PAIN  UNUSUAL VAGINAL DISCHARGE OR ITCHING   Items with * indicate a potential emergency and should be followed up as soon as possible or go to the Emergency Department if any problems should occur.  Please show the CHEMOTHERAPY ALERT CARD or IMMUNOTHERAPY ALERT CARD at check-in to the Emergency Department  and triage nurse.  Should you have questions after your visit or need to cancel or reschedule your appointment, please contact [facility] [phone]  and follow the prompts.  Office hours are 8:00 a.m. to 4:30 p.m. Monday - Friday. Please note that voicemails left after 4:00 p.m. may not be returned until the following business day.  We are closed weekends and major holidays. You have access to a nurse at all times for urgent questions. Please call the main number to the clinic [phone] and follow the prompts.  For any non-urgent questions, you may also contact your provider using MyChart. We now offer e-Visits for anyone 41 and older to request care online for non-urgent symptoms. For details visit [email or web address].   Also download the MyChart app! Go to the app store, search "MyChart", open the app, select [facility], and log in with your MyChart username and password.  Due to Covid, a mask is required upon entering the hospital/clinic. If you do not have a mask, one will be given to you upon arrival. For doctor visits, patients may have 1 support person aged 45 or older with them. For treatment visits, patients cannot have anyone with them due to current Covid guidelines and our immunocompromised population.

## 2021-01-11 NOTE — Progress Notes (Signed)
Patient tolerated Velcade injection with no complaints voiced. Lab work reviewed. See MAR for details. Injection site clean and dry with no bruising or swelling noted. Patient stable during and after injection. Band aid applied. VSS. Patient left in satisfactory condition with no s/s of distress noted. 

## 2021-01-12 ENCOUNTER — Ambulatory Visit (HOSPITAL_COMMUNITY)
Admission: RE | Admit: 2021-01-12 | Discharge: 2021-01-12 | Disposition: A | Discharge status: Home / Self Care | Payer: BC Managed Care – PPO | Source: Ambulatory Visit | Attending: Family Medicine | Admitting: Family Medicine

## 2021-01-12 DIAGNOSIS — Z1231 Encounter for screening mammogram for malignant neoplasm of breast: Secondary | ICD-10-CM

## 2021-01-17 ENCOUNTER — Encounter (HOSPITAL_COMMUNITY): Payer: Self-pay | Admitting: Hematology

## 2021-01-17 NOTE — Telephone Encounter (Signed)
Velcade refilled per last office visit with Dr. Delton Coombes

## 2021-01-19 ENCOUNTER — Encounter (HOSPITAL_COMMUNITY): Payer: Self-pay | Admitting: Hematology

## 2021-01-24 ENCOUNTER — Other Ambulatory Visit (HOSPITAL_COMMUNITY): Payer: Self-pay | Admitting: Hematology

## 2021-01-24 ENCOUNTER — Other Ambulatory Visit: Payer: Self-pay | Admitting: Family Medicine

## 2021-01-24 DIAGNOSIS — K219 Gastro-esophageal reflux disease without esophagitis: Secondary | ICD-10-CM

## 2021-01-24 DIAGNOSIS — G620 Drug-induced polyneuropathy: Secondary | ICD-10-CM

## 2021-01-24 DIAGNOSIS — T451X5A Adverse effect of antineoplastic and immunosuppressive drugs, initial encounter: Secondary | ICD-10-CM

## 2021-01-24 DIAGNOSIS — C9000 Multiple myeloma not having achieved remission: Secondary | ICD-10-CM

## 2021-01-25 ENCOUNTER — Inpatient Hospital Stay (HOSPITAL_COMMUNITY): Payer: BC Managed Care – PPO

## 2021-01-25 ENCOUNTER — Other Ambulatory Visit (HOSPITAL_COMMUNITY): Payer: Self-pay | Admitting: Hematology and Oncology

## 2021-01-25 ENCOUNTER — Other Ambulatory Visit: Payer: Self-pay

## 2021-01-25 VITALS — BP 128/74 | HR 65 | Temp 96.9°F | Resp 20 | Wt 260.2 lb

## 2021-01-25 DIAGNOSIS — Z5112 Encounter for antineoplastic immunotherapy: Secondary | ICD-10-CM | POA: Diagnosis not present

## 2021-01-25 DIAGNOSIS — C9000 Multiple myeloma not having achieved remission: Secondary | ICD-10-CM

## 2021-01-25 LAB — CBC WITH DIFFERENTIAL/PLATELET
Abs Immature Granulocytes: 0.01 10*3/uL (ref 0.00–0.07)
Basophils Absolute: 0 10*3/uL (ref 0.0–0.1)
Basophils Relative: 1 %
Eosinophils Absolute: 0.1 10*3/uL (ref 0.0–0.5)
Eosinophils Relative: 1 %
HCT: 36.5 % (ref 36.0–46.0)
Hemoglobin: 11.7 g/dL — ABNORMAL LOW (ref 12.0–15.0)
Immature Granulocytes: 0 %
Lymphocytes Relative: 27 %
Lymphs Abs: 1.1 10*3/uL (ref 0.7–4.0)
MCH: 31.8 pg (ref 26.0–34.0)
MCHC: 32.1 g/dL (ref 30.0–36.0)
MCV: 99.2 fL (ref 80.0–100.0)
Monocytes Absolute: 0.5 10*3/uL (ref 0.1–1.0)
Monocytes Relative: 11 %
Neutro Abs: 2.6 10*3/uL (ref 1.7–7.7)
Neutrophils Relative %: 60 %
Platelets: 168 10*3/uL (ref 150–400)
RBC: 3.68 MIL/uL — ABNORMAL LOW (ref 3.87–5.11)
RDW: 15.2 % (ref 11.5–15.5)
WBC: 4.3 10*3/uL (ref 4.0–10.5)
nRBC: 0 % (ref 0.0–0.2)

## 2021-01-25 LAB — COMPREHENSIVE METABOLIC PANEL
ALT: 14 U/L (ref 0–44)
AST: 19 U/L (ref 15–41)
Albumin: 3.9 g/dL (ref 3.5–5.0)
Alkaline Phosphatase: 67 U/L (ref 38–126)
Anion gap: 10 (ref 5–15)
BUN: 22 mg/dL — ABNORMAL HIGH (ref 6–20)
CO2: 23 mmol/L (ref 22–32)
Calcium: 8.8 mg/dL — ABNORMAL LOW (ref 8.9–10.3)
Chloride: 104 mmol/L (ref 98–111)
Creatinine, Ser: 0.96 mg/dL (ref 0.44–1.00)
GFR, Estimated: >60 mL/min (ref >60)
Glucose, Bld: 149 mg/dL — ABNORMAL HIGH (ref 70–99)
Potassium: 3.7 mmol/L (ref 3.5–5.1)
Sodium: 137 mmol/L (ref 135–145)
Total Bilirubin: 0.6 mg/dL (ref 0.3–1.2)
Total Protein: 6.7 g/dL (ref 6.5–8.1)

## 2021-01-25 LAB — MAGNESIUM: Magnesium: 1.8 mg/dL (ref 1.7–2.4)

## 2021-01-25 MED ORDER — BORTEZOMIB CHEMO SQ INJECTION 3.5 MG (2.5MG/ML)
1.0400 mg/m2 | Freq: Once | INTRAMUSCULAR | Status: AC
  Administered 2021-01-25: 2.5 mg via SUBCUTANEOUS
  Filled 2021-01-25: qty 1

## 2021-01-25 MED ORDER — PROCHLORPERAZINE MALEATE 10 MG PO TABS: 10.0000 mg | ORAL_TABLET | Freq: Once | ORAL | Status: DC

## 2021-01-25 NOTE — Patient Instructions (Signed)
[facility]  Discharge Instructions: Thank you for choosing [facility] to provide your oncology and hematology care.  If you have a lab appointment with the [facility], please come in thru the Main Entrance and check in at the main information desk.  Wear comfortable clothing and clothing appropriate for easy access to any Portacath or PICC line.   We strive to give you quality time with your provider. You may need to reschedule your appointment if you arrive late (15 or more minutes).  Arriving late affects you and other patients whose appointments are after yours.  Also, if you miss three or more appointments without notifying the office, you may be dismissed from the clinic at the provider's discretion.      For prescription refill requests, have your pharmacy contact our office and allow 72 hours for refills to be completed.    Today you received the following chemotherapy and/or immunotherapy agents Velcade      To help prevent nausea and vomiting after your treatment, we encourage you to take your nausea medication as directed.  BELOW ARE SYMPTOMS THAT SHOULD BE REPORTED IMMEDIATELY: *FEVER GREATER THAN 100.4 F (38 C) OR HIGHER *CHILLS OR SWEATING *NAUSEA AND VOMITING THAT IS NOT CONTROLLED WITH YOUR NAUSEA MEDICATION *UNUSUAL SHORTNESS OF BREATH *UNUSUAL BRUISING OR BLEEDING *URINARY PROBLEMS (pain or burning when urinating, or frequent urination) *BOWEL PROBLEMS (unusual diarrhea, constipation, pain near the anus) TENDERNESS IN MOUTH AND THROAT WITH OR WITHOUT PRESENCE OF ULCERS (sore throat, sores in mouth, or a toothache) UNUSUAL RASH, SWELLING OR PAIN  UNUSUAL VAGINAL DISCHARGE OR ITCHING   Items with * indicate a potential emergency and should be followed up as soon as possible or go to the Emergency Department if any problems should occur.  Please show the CHEMOTHERAPY ALERT CARD or IMMUNOTHERAPY ALERT CARD at check-in to the Emergency  Department and triage nurse.  Should you have questions after your visit or need to cancel or reschedule your appointment, please contact [facility] [phone]  and follow the prompts.  Office hours are 8:00 a.m. to 4:30 p.m. Monday - Friday. Please note that voicemails left after 4:00 p.m. may not be returned until the following business day.  We are closed weekends and major holidays. You have access to a nurse at all times for urgent questions. Please call the main number to the clinic [phone] and follow the prompts.  For any non-urgent questions, you may also contact your provider using MyChart. We now offer e-Visits for anyone 56 and older to request care online for non-urgent symptoms. For details visit [email or web address].   Also download the MyChart app! Go to the app store, search "MyChart", open the app, select [facility], and log in with your MyChart username and password.  Due to Covid, a mask is required upon entering the hospital/clinic. If you do not have a mask, one will be given to you upon arrival. For doctor visits, patients may have 1 support person aged 72 or older with them. For treatment visits, patients cannot have anyone with them due to current Covid guidelines and our immunocompromised population.

## 2021-01-25 NOTE — Progress Notes (Signed)
Joanna Reid presents today for Velcade injection per the provider's orders.  Vital signs and Labs within parameters for treatment.  Stable during administration without incident; injection site WNL; see MAR for injection details.  Patient tolerated procedure well and without incident.  No questions or complaints noted at this time.  Discharge from clinic ambulatory in stable condition.  Alert and oriented X 3.  Follow up with [facility] as scheduled.

## 2021-01-27 ENCOUNTER — Other Ambulatory Visit: Payer: Self-pay | Admitting: Family Medicine

## 2021-01-27 DIAGNOSIS — F411 Generalized anxiety disorder: Secondary | ICD-10-CM

## 2021-01-27 DIAGNOSIS — F430 Acute stress reaction: Secondary | ICD-10-CM

## 2021-02-06 ENCOUNTER — Other Ambulatory Visit (HOSPITAL_COMMUNITY): Payer: Self-pay | Admitting: Surgery

## 2021-02-06 DIAGNOSIS — C9000 Multiple myeloma not having achieved remission: Secondary | ICD-10-CM

## 2021-02-08 ENCOUNTER — Inpatient Hospital Stay (HOSPITAL_COMMUNITY): Payer: BC Managed Care – PPO

## 2021-02-08 ENCOUNTER — Inpatient Hospital Stay (HOSPITAL_COMMUNITY): Payer: BC Managed Care – PPO | Attending: Hematology

## 2021-02-08 ENCOUNTER — Other Ambulatory Visit: Payer: Self-pay

## 2021-02-08 ENCOUNTER — Encounter (HOSPITAL_COMMUNITY): Payer: Self-pay

## 2021-02-08 VITALS — BP 117/87 | HR 67 | Temp 96.2°F | Resp 18 | Wt 268.0 lb

## 2021-02-08 DIAGNOSIS — C9000 Multiple myeloma not having achieved remission: Secondary | ICD-10-CM

## 2021-02-08 DIAGNOSIS — Z79899 Other long term (current) drug therapy: Secondary | ICD-10-CM | POA: Diagnosis not present

## 2021-02-08 DIAGNOSIS — Z5112 Encounter for antineoplastic immunotherapy: Secondary | ICD-10-CM | POA: Diagnosis present

## 2021-02-08 LAB — CBC WITH DIFFERENTIAL/PLATELET
Abs Immature Granulocytes: 0.01 10*3/uL (ref 0.00–0.07)
Basophils Absolute: 0 10*3/uL (ref 0.0–0.1)
Basophils Relative: 1 %
Eosinophils Absolute: 0.1 10*3/uL (ref 0.0–0.5)
Eosinophils Relative: 1 %
HCT: 36.8 % (ref 36.0–46.0)
Hemoglobin: 11.7 g/dL — ABNORMAL LOW (ref 12.0–15.0)
Immature Granulocytes: 0 %
Lymphocytes Relative: 27 %
Lymphs Abs: 1.3 10*3/uL (ref 0.7–4.0)
MCH: 31.5 pg (ref 26.0–34.0)
MCHC: 31.8 g/dL (ref 30.0–36.0)
MCV: 99.2 fL (ref 80.0–100.0)
Monocytes Absolute: 0.5 10*3/uL (ref 0.1–1.0)
Monocytes Relative: 10 %
Neutro Abs: 2.9 10*3/uL (ref 1.7–7.7)
Neutrophils Relative %: 61 %
Platelets: 179 10*3/uL (ref 150–400)
RBC: 3.71 MIL/uL — ABNORMAL LOW (ref 3.87–5.11)
RDW: 15.1 % (ref 11.5–15.5)
WBC: 4.7 10*3/uL (ref 4.0–10.5)
nRBC: 0 % (ref 0.0–0.2)

## 2021-02-08 LAB — COMPREHENSIVE METABOLIC PANEL
ALT: 14 U/L (ref 0–44)
AST: 21 U/L (ref 15–41)
Albumin: 4 g/dL (ref 3.5–5.0)
Alkaline Phosphatase: 68 U/L (ref 38–126)
Anion gap: 9 (ref 5–15)
BUN: 17 mg/dL (ref 6–20)
CO2: 28 mmol/L (ref 22–32)
Calcium: 9 mg/dL (ref 8.9–10.3)
Chloride: 103 mmol/L (ref 98–111)
Creatinine, Ser: 0.98 mg/dL (ref 0.44–1.00)
GFR, Estimated: >60 mL/min (ref >60)
Glucose, Bld: 165 mg/dL — ABNORMAL HIGH (ref 70–99)
Potassium: 3.9 mmol/L (ref 3.5–5.1)
Sodium: 140 mmol/L (ref 135–145)
Total Bilirubin: 0.7 mg/dL (ref 0.3–1.2)
Total Protein: 7 g/dL (ref 6.5–8.1)

## 2021-02-08 LAB — MAGNESIUM: Magnesium: 1.9 mg/dL (ref 1.7–2.4)

## 2021-02-08 MED ORDER — BORTEZOMIB CHEMO SQ INJECTION 3.5 MG (2.5MG/ML)
1.0400 mg/m2 | Freq: Once | INTRAMUSCULAR | Status: AC
  Administered 2021-02-08: 2.5 mg via SUBCUTANEOUS
  Filled 2021-02-08: qty 1

## 2021-02-08 NOTE — Progress Notes (Signed)
Patient presents today for Velcade injection. Vital signs within parameters for treatment. Labs reviewed and within parameters. Patient has no complaints today of any changes since her last tx. Patient states the neuropathy in her feet is the same and now is in her hands bilateral per patient's words. Patient took own Compazine prior to arrival.    Treatment given today per MD orders. Tolerated without adverse affects. Vital signs stable. No complaints at this time. Discharged from clinic ambulatory in stable condition. Alert and oriented x 3. F/U with [facility] as scheduled.

## 2021-02-08 NOTE — Patient Instructions (Signed)
[facility]  Discharge Instructions: Thank you for choosing [facility] to provide your oncology and hematology care.  If you have a lab appointment with the [facility], please come in thru the Main Entrance and check in at the main information desk.  Wear comfortable clothing and clothing appropriate for easy access to any Portacath or PICC line.   We strive to give you quality time with your provider. You may need to reschedule your appointment if you arrive late (15 or more minutes).  Arriving late affects you and other patients whose appointments are after yours.  Also, if you miss three or more appointments without notifying the office, you may be dismissed from the clinic at the provider's discretion.      For prescription refill requests, have your pharmacy contact our office and allow 72 hours for refills to be completed.    Today you received the following chemotherapy and/or immunotherapy agents Velcade injection.      To help prevent nausea and vomiting after your treatment, we encourage you to take your nausea medication as directed.  BELOW ARE SYMPTOMS THAT SHOULD BE REPORTED IMMEDIATELY: *FEVER GREATER THAN 100.4 F (38 C) OR HIGHER *CHILLS OR SWEATING *NAUSEA AND VOMITING THAT IS NOT CONTROLLED WITH YOUR NAUSEA MEDICATION *UNUSUAL SHORTNESS OF BREATH *UNUSUAL BRUISING OR BLEEDING *URINARY PROBLEMS (pain or burning when urinating, or frequent urination) *BOWEL PROBLEMS (unusual diarrhea, constipation, pain near the anus) TENDERNESS IN MOUTH AND THROAT WITH OR WITHOUT PRESENCE OF ULCERS (sore throat, sores in mouth, or a toothache) UNUSUAL RASH, SWELLING OR PAIN  UNUSUAL VAGINAL DISCHARGE OR ITCHING   Items with * indicate a potential emergency and should be followed up as soon as possible or go to the Emergency Department if any problems should occur.  Please show the CHEMOTHERAPY ALERT CARD or IMMUNOTHERAPY ALERT CARD at check-in to the  Emergency Department and triage nurse.  Should you have questions after your visit or need to cancel or reschedule your appointment, please contact [facility] [phone]  and follow the prompts.  Office hours are 8:00 a.m. to 4:30 p.m. Monday - Friday. Please note that voicemails left after 4:00 p.m. may not be returned until the following business day.  We are closed weekends and major holidays. You have access to a nurse at all times for urgent questions. Please call the main number to the clinic [phone] and follow the prompts.  For any non-urgent questions, you may also contact your provider using MyChart. We now offer e-Visits for anyone 6 and older to request care online for non-urgent symptoms. For details visit [email or web address].   Also download the MyChart app! Go to the app store, search "MyChart", open the app, select [facility], and log in with your MyChart username and password.  Due to Covid, a mask is required upon entering the hospital/clinic. If you do not have a mask, one will be given to you upon arrival. For doctor visits, patients may have 1 support person aged 70 or older with them. For treatment visits, patients cannot have anyone with them due to current Covid guidelines and our immunocompromised population.

## 2021-02-22 ENCOUNTER — Encounter (HOSPITAL_COMMUNITY): Payer: Self-pay

## 2021-02-22 ENCOUNTER — Other Ambulatory Visit: Payer: Self-pay

## 2021-02-22 ENCOUNTER — Inpatient Hospital Stay (HOSPITAL_COMMUNITY): Payer: BC Managed Care – PPO

## 2021-02-22 VITALS — BP 118/75 | HR 62 | Temp 96.7°F | Resp 18 | Wt 261.8 lb

## 2021-02-22 DIAGNOSIS — C9000 Multiple myeloma not having achieved remission: Secondary | ICD-10-CM

## 2021-02-22 DIAGNOSIS — Z5112 Encounter for antineoplastic immunotherapy: Secondary | ICD-10-CM | POA: Diagnosis not present

## 2021-02-22 LAB — CBC WITH DIFFERENTIAL/PLATELET
Abs Immature Granulocytes: 0.02 10*3/uL (ref 0.00–0.07)
Basophils Absolute: 0 10*3/uL (ref 0.0–0.1)
Basophils Relative: 1 %
Eosinophils Absolute: 0 10*3/uL (ref 0.0–0.5)
Eosinophils Relative: 1 %
HCT: 36.8 % (ref 36.0–46.0)
Hemoglobin: 12 g/dL (ref 12.0–15.0)
Immature Granulocytes: 0 %
Lymphocytes Relative: 29 %
Lymphs Abs: 1.3 10*3/uL (ref 0.7–4.0)
MCH: 31.9 pg (ref 26.0–34.0)
MCHC: 32.6 g/dL (ref 30.0–36.0)
MCV: 97.9 fL (ref 80.0–100.0)
Monocytes Absolute: 0.5 10*3/uL (ref 0.1–1.0)
Monocytes Relative: 10 %
Neutro Abs: 2.7 10*3/uL (ref 1.7–7.7)
Neutrophils Relative %: 59 %
Platelets: 163 10*3/uL (ref 150–400)
RBC: 3.76 MIL/uL — ABNORMAL LOW (ref 3.87–5.11)
RDW: 14.9 % (ref 11.5–15.5)
WBC: 4.5 10*3/uL (ref 4.0–10.5)
nRBC: 0 % (ref 0.0–0.2)

## 2021-02-22 LAB — COMPREHENSIVE METABOLIC PANEL
ALT: 14 U/L (ref 0–44)
AST: 20 U/L (ref 15–41)
Albumin: 4.1 g/dL (ref 3.5–5.0)
Alkaline Phosphatase: 63 U/L (ref 38–126)
Anion gap: 8 (ref 5–15)
BUN: 20 mg/dL (ref 6–20)
CO2: 24 mmol/L (ref 22–32)
Calcium: 8.6 mg/dL — ABNORMAL LOW (ref 8.9–10.3)
Chloride: 106 mmol/L (ref 98–111)
Creatinine, Ser: 0.9 mg/dL (ref 0.44–1.00)
GFR, Estimated: >60 mL/min (ref >60)
Glucose, Bld: 109 mg/dL — ABNORMAL HIGH (ref 70–99)
Potassium: 3.9 mmol/L (ref 3.5–5.1)
Sodium: 138 mmol/L (ref 135–145)
Total Bilirubin: 0.7 mg/dL (ref 0.3–1.2)
Total Protein: 6.9 g/dL (ref 6.5–8.1)

## 2021-02-22 LAB — LACTATE DEHYDROGENASE: LDH: 140 U/L (ref 98–192)

## 2021-02-22 LAB — MAGNESIUM: Magnesium: 1.9 mg/dL (ref 1.7–2.4)

## 2021-02-22 MED ORDER — BORTEZOMIB CHEMO SQ INJECTION 3.5 MG (2.5MG/ML)
1.0400 mg/m2 | Freq: Once | INTRAMUSCULAR | Status: AC
  Administered 2021-02-22: 2.5 mg via SUBCUTANEOUS
  Filled 2021-02-22: qty 1

## 2021-02-22 NOTE — Patient Instructions (Signed)
[facility]  Discharge Instructions: Thank you for choosing [facility] to provide your oncology and hematology care.  If you have a lab appointment with the [facility], please come in thru the Main Entrance and check in at the main information desk.  Wear comfortable clothing and clothing appropriate for easy access to any Portacath or PICC line.   We strive to give you quality time with your provider. You may need to reschedule your appointment if you arrive late (15 or more minutes).  Arriving late affects you and other patients whose appointments are after yours.  Also, if you miss three or more appointments without notifying the office, you may be dismissed from the clinic at the provider's discretion.      For prescription refill requests, have your pharmacy contact our office and allow 72 hours for refills to be completed.    Today you received the following chemotherapy and/or immunotherapy agents velcade      To help prevent nausea and vomiting after your treatment, we encourage you to take your nausea medication as directed.  BELOW ARE SYMPTOMS THAT SHOULD BE REPORTED IMMEDIATELY: *FEVER GREATER THAN 100.4 F (38 C) OR HIGHER *CHILLS OR SWEATING *NAUSEA AND VOMITING THAT IS NOT CONTROLLED WITH YOUR NAUSEA MEDICATION *UNUSUAL SHORTNESS OF BREATH *UNUSUAL BRUISING OR BLEEDING *URINARY PROBLEMS (pain or burning when urinating, or frequent urination) *BOWEL PROBLEMS (unusual diarrhea, constipation, pain near the anus) TENDERNESS IN MOUTH AND THROAT WITH OR WITHOUT PRESENCE OF ULCERS (sore throat, sores in mouth, or a toothache) UNUSUAL RASH, SWELLING OR PAIN  UNUSUAL VAGINAL DISCHARGE OR ITCHING   Items with * indicate a potential emergency and should be followed up as soon as possible or go to the Emergency Department if any problems should occur.  Please show the CHEMOTHERAPY ALERT CARD or IMMUNOTHERAPY ALERT CARD at check-in to the Emergency  Department and triage nurse.  Should you have questions after your visit or need to cancel or reschedule your appointment, please contact [facility] [phone]  and follow the prompts.  Office hours are 8:00 a.m. to 4:30 p.m. Monday - Friday. Please note that voicemails left after 4:00 p.m. may not be returned until the following business day.  We are closed weekends and major holidays. You have access to a nurse at all times for urgent questions. Please call the main number to the clinic [phone] and follow the prompts.  For any non-urgent questions, you may also contact your provider using MyChart. We now offer e-Visits for anyone 4 and older to request care online for non-urgent symptoms. For details visit [email or web address].   Also download the MyChart app! Go to the app store, search "MyChart", open the app, select [facility], and log in with your MyChart username and password.  Due to Covid, a mask is required upon entering the hospital/clinic. If you do not have a mask, one will be given to you upon arrival. For doctor visits, patients may have 1 support person aged 63 or older with them. For treatment visits, patients cannot have anyone with them due to current Covid guidelines and our immunocompromised population.

## 2021-02-22 NOTE — Progress Notes (Signed)
Patient takes compazine at home.  No complaints for today's visit.   Patient tolerated Velcade injection with no complaints voiced.  Lab work reviewed.  See MAR for details.  Injection site clean and dry with no bruising or swelling noted.  Patient stable during and after injection.  Band aid applied.  VSS.  Patient left in satisfactory condition with no s/s of distress noted.

## 2021-02-23 LAB — KAPPA/LAMBDA LIGHT CHAINS
Kappa free light chain: 20.1 mg/L — ABNORMAL HIGH (ref 3.3–19.4)
Kappa, lambda light chain ratio: 1.12 (ref 0.26–1.65)
Lambda free light chains: 18 mg/L (ref 5.7–26.3)

## 2021-02-24 LAB — PROTEIN ELECTROPHORESIS, SERUM
A/G Ratio: 1.3 (ref 0.7–1.7)
Albumin ELP: 3.5 g/dL (ref 2.9–4.4)
Alpha-1-Globulin: 0.2 g/dL (ref 0.0–0.4)
Alpha-2-Globulin: 0.7 g/dL (ref 0.4–1.0)
Beta Globulin: 1 g/dL (ref 0.7–1.3)
Gamma Globulin: 0.8 g/dL (ref 0.4–1.8)
Globulin, Total: 2.7 g/dL (ref 2.2–3.9)
Total Protein ELP: 6.2 g/dL (ref 6.0–8.5)

## 2021-02-26 LAB — IMMUNOFIXATION ELECTROPHORESIS
IgA: 91 mg/dL (ref 87–352)
IgG (Immunoglobin G), Serum: 812 mg/dL (ref 586–1602)
IgM (Immunoglobulin M), Srm: 20 mg/dL — ABNORMAL LOW (ref 26–217)
Total Protein ELP: 6.3 g/dL (ref 6.0–8.5)

## 2021-03-07 NOTE — Progress Notes (Signed)
[place], [place] [address]   CLINIC:  Medical Oncology/Hematology  PCP:  [name], DO [address] / [place] [address] [phone]   REASON FOR VISIT:  Follow-up for  multiple myeloma  PRIOR THERAPY:  1. RVD x 4 cycles from 11/01/2017 through 01/14/2018. 2. Stem cell transplant on 02/20/2018. 3. Velcade from 06/11/2018 to 12/22/2019.  NGS Results: not done  CURRENT THERAPY: Velcade every 2 weeks  BRIEF ONCOLOGIC HISTORY:  Oncology History  Multiple myeloma ([place])  10/29/2017 Initial Diagnosis   Multiple myeloma ([place])   11/01/2017 - 01/24/2018 Chemotherapy   The patient had dexamethasone (DECADRON) 4 MG tablet, 1 of 1 cycle, Start date: --, End date: -- lenalidomide (REVLIMID) 25 MG capsule, 1 of 1 cycle, Start date: --, End date: -- bortezomib SQ (VELCADE) chemo injection 3 mg, 1.3 mg/m2 = 3 mg, Subcutaneous,  Once, 5 of 5 cycles Administration: 3 mg (11/01/2017), 3 mg (11/08/2017), 3 mg (11/05/2017), 3 mg (11/22/2017), 3 mg (11/12/2017), 3 mg (11/29/2017), 3 mg (11/26/2017), 3 mg (12/03/2017), 3 mg (12/13/2017), 3 mg (12/20/2017), 3 mg (12/17/2017), 3 mg (12/24/2017), 3 mg (01/03/2018), 3 mg (01/10/2018), 3 mg (01/07/2018), 3 mg (01/14/2018), 3 mg (01/24/2018)   for chemotherapy treatment.     06/11/2018 -  Chemotherapy    Patient is on Treatment Plan: MYELOMA MAINTENANCE BORTEZOMIB SQ Q 7D X 6 WEEKS, TWO WEEKS OFF THEN Q 14D         CANCER STAGING: Cancer Staging Multiple myeloma ([place]) Staging form: Plasma Cell Myeloma and Plasma Cell Disorders, AJCC 8th Edition - Clinical: No stage assigned - Unsigned - Clinical: No stage assigned - Unsigned   INTERVAL HISTORY:  Joanna Reid, a 55 y.o. female, returns for routine follow-up and consideration for next cycle of chemotherapy. Joanna Reid was last seen on 11/30/2020.  Due for cycle #68 of Velcade today.   Overall, she tells me she has been feeling pretty well. She reports that her  voice has improved. She reports occasional numbness in her feet and fingers bilaterally. She denies ankle swellings, n/v/d, and skin rash. She reports severe lower back pain that has worsened over the past 2 weeks.   Overall, she feels ready for next cycle of chemo today.   REVIEW OF SYSTEMS:  Review of Systems  Constitutional:  Negative for appetite change.  Cardiovascular:  Negative for leg swelling.  Gastrointestinal:  Negative for diarrhea, nausea and vomiting.  Musculoskeletal:  Positive for back pain (6/10 lower).  Skin:  Negative for rash.  Neurological:  Positive for numbness (feet and fingers).  All other systems reviewed and are negative.  PAST MEDICAL/SURGICAL HISTORY:  Past Medical History:  Diagnosis Date   Acid reflux    Allergic rhinitis    Cancer ([place])    multiple myeloma   Diabetes mellitus    type 2   Gout    Gout    HBP (high blood pressure)    History of kidney stones    Migraines    Past Surgical History:  Procedure Laterality Date   BREAST CYST EXCISION Left    2009 no visible scar on skin   CESAREAN SECTION     COLONOSCOPY WITH PROPOFOL N/A 12/25/2019   Procedure: COLONOSCOPY WITH PROPOFOL;  Surgeon: Rogene Houston, MD;  [facility];  Service: Endoscopy;  Laterality: N/A;  730   EXTRACORPOREAL SHOCK WAVE LITHOTRIPSY Left 10/10/2017   Procedure: LEFT EXTRACORPOREAL SHOCK WAVE LITHOTRIPSY (ESWL);  Surgeon: Bjorn Loser, MD;  Location: [facility];  Service: Urology;  Laterality: Left;   EYE SURGERY     HEMORRHOID SURGERY N/A 11/19/2012   Procedure: HEMORRHOIDECTOMY;  Surgeon: Jamesetta So, MD;  Location: [facility];  Service: General;  Laterality: N/A;   kidney stones  1998   LAPAROSCOPIC UNILATERAL SALPINGO OOPHERECTOMY  05/14/2012   Procedure: LAPAROSCOPIC UNILATERAL SALPINGO OOPHORECTOMY;  Surgeon: [name], MD;  Location: [facility];  Service: Gynecology;  Laterality: Right;  laparoscopic right salpingo-oophorectomy   PARTIAL HYSTERECTOMY      POLYPECTOMY  12/25/2019   Procedure: POLYPECTOMY;  Surgeon: Rogene Houston, MD;  Location: [facility];  Service: Endoscopy;;   TONSILECTOMY, ADENOIDECTOMY, BILATERAL MYRINGOTOMY AND TUBES     VESICOVAGINAL FISTULA CLOSURE W/ TAH      SOCIAL HISTORY:  Social History   Socioeconomic History   Marital status: Married    Spouse name: Not on file   Number of children: Not on file   Years of education: 12   Highest education level: Not on file  Occupational History    Employer: [employer]  Tobacco Use   Smoking status: Former    Types: Cigarettes    Quit date: 02/27/1999    Years since quitting: 22.0   Smokeless tobacco: Never   Tobacco comments:    socially   Vaping Use   Vaping Use: Never used  Substance and Sexual Activity   Alcohol use: No    Alcohol/week: 0.0 standard drinks   Drug use: No   Sexual activity: Yes    Birth control/protection: Surgical    Comment: hyst  Other Topics Concern   Not on file  Social History Narrative   Not on file   Social Determinants of Health   Financial Resource Strain: Not on file  Food Insecurity: Not on file  Transportation Needs: Not on file  Physical Activity: Not on file  Stress: Not on file  Social Connections: Not on file  Intimate Partner Violence: Not on file    FAMILY HISTORY:  Family History  Problem Relation Age of Onset   Arthritis Other    Cancer Other    Diabetes Other    Hypertension Mother    Dementia Mother    Diabetes Father    ALS Father    Diabetes Brother    Hypertension Brother    Cancer Paternal Aunt    COPD Maternal Grandmother    Cancer Maternal Grandfather    Anesthesia problems Paternal Grandfather     CURRENT MEDICATIONS:  Current Outpatient Medications  Medication Sig Dispense Refill   acyclovir (ZOVIRAX) 400 MG tablet TAKE (1) TABLET BY MOUTH TWICE DAILY. 60 tablet 0   acyclovir (ZOVIRAX) 400 MG tablet Take 1 tablet (400 mg total) by mouth 2 (two) times daily. 60 tablet 3    albuterol (VENTOLIN HFA) 108 (90 Base) MCG/ACT inhaler Inhale 2 puffs into the lungs every 4 (four) hours as needed for wheezing or shortness of breath. 1 each 3   allopurinol (ZYLOPRIM) 300 MG tablet TAKE (1) TABLET BY MOUTH ONCE DAILY. 90 tablet 1   amLODipine (NORVASC) 2.5 MG tablet Take 2.5 mg by mouth daily.     amLODipine (NORVASC) 2.5 MG tablet Take by mouth.     amLODipine (NORVASC) 5 MG tablet Take 5 mg by mouth daily.     aspirin EC 81 MG tablet Take 1 tablet (81 mg total) by mouth daily. 30 tablet 11   bumetanide (BUMEX) 0.5 MG tablet  Take 0.5 mg by mouth daily.      carvedilol (COREG) 25 MG tablet Take 1 tablet (25 mg total) by mouth 2 (two) times daily.     carvedilol (COREG) 25 MG tablet Take by mouth.     diazepam (VALIUM) 5 MG tablet Take 1 tablet (5 mg total) by mouth at bedtime. 30 tablet 2   Docosanol 10 % CREA Apply 1 application topically daily as needed (for nose).      docusate sodium (COLACE) 100 MG capsule Take 100 mg by mouth daily.      erythromycin ophthalmic ointment Place 1 application into the left eye at bedtime. 3.5 g 0   escitalopram (LEXAPRO) 10 MG tablet TAKE (1) TABLET BY MOUTH ONCE DAILY. MAY START WITH 1/2 TABLET FOR 7 DAYS. 90 tablet 0   EUFLEXXA 20 MG/2ML SOSY SMARTSIG:1 Syringe(s) I-ARTIC Once a Week     fluticasone (FLONASE) 50 MCG/ACT nasal spray INSTILL 2 SPRAYS INTO BOTH NOSTRILS DAILY 16 g 1   losartan (COZAAR) 100 MG tablet Take 100 mg by mouth daily.     magnesium oxide (MAG-OX) 400 (241.3 Mg) MG tablet Take 1 tablet (400 mg total) by mouth 3 (three) times daily. 90 tablet 3   metFORMIN (GLUCOPHAGE) 500 MG tablet TAKE 1 TABLET BY MOUTH ONCE DAILY WITH BREAKFAST (Patient taking differently: Take 1,000 mg by mouth 2 (two) times daily with a meal.) 90 tablet 0   nitrofurantoin, macrocrystal-monohydrate, (MACROBID) 100 MG capsule Take 1 capsule (100 mg total) by mouth at bedtime. 90 capsule 3   ondansetron (ZOFRAN) 8 MG tablet TAKE 1 TABLET BY MOUTH  EVERY 8 HOURS AS NEEDED. 30 tablet 6   pantoprazole (PROTONIX) 40 MG tablet TAKE (1) TABLET BY MOUTH ONCE DAILY. 90 tablet 1   pantoprazole (PROTONIX) 40 MG tablet Take by mouth.     pregabalin (LYRICA) 200 MG capsule TAKE (1) CAPSULE BY MOUTH TWICE DAILY. 60 capsule 6   prochlorperazine (COMPAZINE) 10 MG tablet TAKE 1 TABLET BY MOUTH EVERY 6 HOURS AS NEEDED FOR NAUSEA OR VOMITING. 30 tablet 6   Propylene Glycol (SYSTANE BALANCE) 0.6 % SOLN Apply 1 drop to eye daily as needed (dry eye).      rosuvastatin (CRESTOR) 5 MG tablet Take 5 mg by mouth daily.     TRULICITY 1.5 YH/0.6CB SOPN Inject 0.75 mg into the skin every Monday.      valACYclovir (VALTREX) 1000 MG tablet TAKE 2 TABLETS BY MOUTH NOW; THEN 2 12 HOURS LATER. 12 tablet 6   VELCADE 3.5 MG injection Inject 3.5 mg into the vein every 14 (fourteen) days. 2 each 0   No current facility-administered medications for this visit.    ALLERGIES:  No Known Allergies  PHYSICAL EXAM:  Performance status (ECOG): 1 - Symptomatic but completely ambulatory  There were no vitals filed for this visit. Wt Readings from Last 3 Encounters:  02/22/21 261 lb 12.8 oz (118.8 kg)  02/08/21 268 lb 0.2 oz (121.6 kg)  01/25/21 260 lb 3.2 oz (118 kg)   Physical Exam Vitals reviewed.  Constitutional:      Appearance: Normal appearance. She is obese.  Cardiovascular:     Rate and Rhythm: Normal rate and regular rhythm.     Pulses: Normal pulses.     Heart sounds: Normal heart sounds.  Pulmonary:     Effort: Pulmonary effort is normal.     Breath sounds: Normal breath sounds.  Neurological:     General: No focal deficit present.  Mental Status: She is alert and oriented to person, place, and time.  Psychiatric:        Mood and Affect: Mood normal.        Behavior: Behavior normal.    LABORATORY DATA:  I have reviewed the labs as listed.  CBC Latest Ref Rng & Units 02/22/2021 02/08/2021 01/25/2021  WBC 4.0 - 10.5 K/uL 4.5 4.7 4.3  Hemoglobin  12.0 - 15.0 g/dL 12.0 11.7(L) 11.7(L)  Hematocrit 36.0 - 46.0 % 36.8 36.8 36.5  Platelets 150 - 400 K/uL 163 179 168   CMP Latest Ref Rng & Units 02/22/2021 02/08/2021 01/25/2021  Glucose 70 - 99 mg/dL 109(H) 165(H) 149(H)  BUN 6 - 20 mg/dL 20 17 22(H)  Creatinine 0.44 - 1.00 mg/dL 0.90 0.98 0.96  Sodium 135 - 145 mmol/L 138 140 137  Potassium 3.5 - 5.1 mmol/L 3.9 3.9 3.7  Chloride 98 - 111 mmol/L 106 103 104  CO2 22 - 32 mmol/L 24 28 23   Calcium 8.9 - 10.3 mg/dL 8.6(L) 9.0 8.8(L)  Total Protein 6.5 - 8.1 g/dL 6.9 7.0 6.7  Total Bilirubin 0.3 - 1.2 mg/dL 0.7 0.7 0.6  Alkaline Phos 38 - 126 U/L 63 68 67  AST 15 - 41 U/L 20 21 19   ALT 0 - 44 U/L 14 14 14     DIAGNOSTIC IMAGING:  I have independently reviewed the scans and discussed with the patient. No results found.   ASSESSMENT:  1.  IgG lambda multiple myeloma, stage III, del 17 p: -4 cycles of RVD from 11/01/2017 through 01/14/2018. -Stem cell transplant on 02/20/2018 at [facility]. -Maintenance Velcade every 2 weeks and Revlimid 10 mg 3 weeks on/1 week off started on 07/29/2018. -Myeloma panel from 12/08/2018 shows M spike not observed.  Kappa light chains of 32.1 with ratio of 1.64.  Immunofixation was negative. 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  MRI of the brain on 12/27/2019 shows acute ischemic nonhemorrhagic left MCA territory infarct involving left parietal lobe, corresponding with perfusion deficit on CT scan angiogram.  No associated hemorrhage or mass-effect.  Additional few scattered punctate acute ischemic nonhemorrhagic infarcts involving bilateral frontal and parietal lobes as well as left cerebellum. - Revlimid was on hold since June 2021 due to potential for contributing to CVA. - Velcade dose was reduced to 1 mg per metered square on 12/28/2020 due to worsening neuropathy in the feet.   2.  CVA with aphasia: -We held her myeloma treatments since CVA with aphasia on 12/26/2019. -Her aphasia is improving.     PLAN:  1.  IgG  lambda multiple myeloma, stage III, del 17 p: - Velcade was dose reduced to 1 mg per metered square on 12/28/2020 due to worsening neuropathy in the feet. - She reports that neuropathy in both feet has been stable.  She does not have any balance issues. - Reviewed myeloma panel from 02/22/2021.  SPEP was negative.  Free light chain ratio was normal.  Immunofixation was normal. - Reviewed labs from today which showed normal CBC.  LFTs were normal.  Creatinine was normal. - Recommend continuing Velcade every 2 weeks at the same dose. - She reported worsening of the low back pain in the last 2 weeks.  She reports that it was similar to when she was initially diagnosed. - Recommend MRI of the lumbar spine with and without contrast. - RTC 3 months for follow-up.   2.  Hypomagnesemia: - Continue magnesium 400 mg 3 times daily.   3.  Bone strengthening agents: - Continue Zometa every 12 weeks.  Calcium is normal.  She will proceed with Zometa today.   4.  Peripheral neuropathy: - Continue Lyrica 200 mg twice daily.   5.  ID prophylaxis: - Continue acyclovir twice daily.   6.  Diabetes: - Continue metformin.  Hemoglobin A1c was on target.  Trulicity dose increased to 3 mg weekly for potential weight loss.   7.  Recurrent UTIs: - Continue Macrobid 100 mg daily for prophylaxis.   Orders placed this encounter:  No orders of the defined types were placed in this encounter.    Derek Jack, MD [facility] [phone]   I, Thana Ates, am acting as a scribe for Dr. Derek Jack.  I, Derek Jack MD, have reviewed the above documentation for accuracy and completeness, and I agree with the above.

## 2021-03-08 ENCOUNTER — Inpatient Hospital Stay (HOSPITAL_BASED_OUTPATIENT_CLINIC_OR_DEPARTMENT_OTHER): Payer: BC Managed Care – PPO | Admitting: Hematology

## 2021-03-08 ENCOUNTER — Inpatient Hospital Stay (HOSPITAL_COMMUNITY): Payer: BC Managed Care – PPO

## 2021-03-08 ENCOUNTER — Other Ambulatory Visit: Payer: Self-pay

## 2021-03-08 VITALS — BP 112/83 | HR 63 | Temp 97.0°F | Resp 18 | Wt 260.7 lb

## 2021-03-08 DIAGNOSIS — Z5112 Encounter for antineoplastic immunotherapy: Secondary | ICD-10-CM | POA: Diagnosis not present

## 2021-03-08 DIAGNOSIS — M545 Low back pain, unspecified: Secondary | ICD-10-CM

## 2021-03-08 DIAGNOSIS — C9000 Multiple myeloma not having achieved remission: Secondary | ICD-10-CM

## 2021-03-08 LAB — CBC WITH DIFFERENTIAL/PLATELET
Abs Immature Granulocytes: 0.01 10*3/uL (ref 0.00–0.07)
Basophils Absolute: 0 10*3/uL (ref 0.0–0.1)
Basophils Relative: 1 %
Eosinophils Absolute: 0 10*3/uL (ref 0.0–0.5)
Eosinophils Relative: 1 %
HCT: 36.5 % (ref 36.0–46.0)
Hemoglobin: 12 g/dL (ref 12.0–15.0)
Immature Granulocytes: 0 %
Lymphocytes Relative: 32 %
Lymphs Abs: 1.5 10*3/uL (ref 0.7–4.0)
MCH: 32.3 pg (ref 26.0–34.0)
MCHC: 32.9 g/dL (ref 30.0–36.0)
MCV: 98.1 fL (ref 80.0–100.0)
Monocytes Absolute: 0.6 10*3/uL (ref 0.1–1.0)
Monocytes Relative: 13 %
Neutro Abs: 2.6 10*3/uL (ref 1.7–7.7)
Neutrophils Relative %: 53 %
Platelets: 166 10*3/uL (ref 150–400)
RBC: 3.72 MIL/uL — ABNORMAL LOW (ref 3.87–5.11)
RDW: 14.9 % (ref 11.5–15.5)
WBC: 4.8 10*3/uL (ref 4.0–10.5)
nRBC: 0 % (ref 0.0–0.2)

## 2021-03-08 LAB — COMPREHENSIVE METABOLIC PANEL
ALT: 14 U/L (ref 0–44)
AST: 19 U/L (ref 15–41)
Albumin: 4 g/dL (ref 3.5–5.0)
Alkaline Phosphatase: 73 U/L (ref 38–126)
Anion gap: 8 (ref 5–15)
BUN: 19 mg/dL (ref 6–20)
CO2: 24 mmol/L (ref 22–32)
Calcium: 9.1 mg/dL (ref 8.9–10.3)
Chloride: 108 mmol/L (ref 98–111)
Creatinine, Ser: 0.91 mg/dL (ref 0.44–1.00)
GFR, Estimated: >60 mL/min (ref >60)
Glucose, Bld: 134 mg/dL — ABNORMAL HIGH (ref 70–99)
Potassium: 4 mmol/L (ref 3.5–5.1)
Sodium: 140 mmol/L (ref 135–145)
Total Bilirubin: 0.4 mg/dL (ref 0.3–1.2)
Total Protein: 6.8 g/dL (ref 6.5–8.1)

## 2021-03-08 LAB — LACTATE DEHYDROGENASE: LDH: 131 U/L (ref 98–192)

## 2021-03-08 MED ORDER — SODIUM CHLORIDE 0.9 % IV SOLN: Freq: Once | INTRAVENOUS | Status: AC

## 2021-03-08 MED ORDER — ZOLEDRONIC ACID 4 MG/5ML IV CONC
3.0000 mg | Freq: Once | INTRAVENOUS | Status: AC
  Administered 2021-03-08: 3 mg via INTRAVENOUS
  Filled 2021-03-08: qty 3.75

## 2021-03-08 MED ORDER — BORTEZOMIB CHEMO SQ INJECTION 3.5 MG (2.5MG/ML)
1.0400 mg/m2 | Freq: Once | INTRAMUSCULAR | Status: AC
  Administered 2021-03-08: 2.5 mg via SUBCUTANEOUS
  Filled 2021-03-08: qty 1

## 2021-03-08 NOTE — Patient Instructions (Signed)
[facility]  Discharge Instructions: Thank you for choosing [facility] to provide your oncology and hematology care.  If you have a lab appointment with the [facility], please come in thru the Main Entrance and check in at the main information desk.  Wear comfortable clothing and clothing appropriate for easy access to any Portacath or PICC line.   We strive to give you quality time with your provider. You may need to reschedule your appointment if you arrive late (15 or more minutes).  Arriving late affects you and other patients whose appointments are after yours.  Also, if you miss three or more appointments without notifying the office, you may be dismissed from the clinic at the provider's discretion.      For prescription refill requests, have your pharmacy contact our office and allow 72 hours for refills to be completed.    Today you received the following chemotherapy: Velcade and Zometa 3 mgs IV.      To help prevent nausea and vomiting after your treatment, we encourage you to take your nausea medication as directed.  BELOW ARE SYMPTOMS THAT SHOULD BE REPORTED IMMEDIATELY: *FEVER GREATER THAN 100.4 F (38 C) OR HIGHER *CHILLS OR SWEATING *NAUSEA AND VOMITING THAT IS NOT CONTROLLED WITH YOUR NAUSEA MEDICATION *UNUSUAL SHORTNESS OF BREATH *UNUSUAL BRUISING OR BLEEDING *URINARY PROBLEMS (pain or burning when urinating, or frequent urination) *BOWEL PROBLEMS (unusual diarrhea, constipation, pain near the anus) TENDERNESS IN MOUTH AND THROAT WITH OR WITHOUT PRESENCE OF ULCERS (sore throat, sores in mouth, or a toothache) UNUSUAL RASH, SWELLING OR PAIN  UNUSUAL VAGINAL DISCHARGE OR ITCHING   Items with * indicate a potential emergency and should be followed up as soon as possible or go to the Emergency Department if any problems should occur.  Please show the CHEMOTHERAPY ALERT CARD or IMMUNOTHERAPY ALERT CARD at check-in to the Emergency Department and  triage nurse.  Should you have questions after your visit or need to cancel or reschedule your appointment, please contact [facility] [phone]  and follow the prompts.  Office hours are 8:00 a.m. to 4:30 p.m. Monday - Friday. Please note that voicemails left after 4:00 p.m. may not be returned until the following business day.  We are closed weekends and major holidays. You have access to a nurse at all times for urgent questions. Please call the main number to the clinic [phone] and follow the prompts.  For any non-urgent questions, you may also contact your provider using MyChart. We now offer e-Visits for anyone 41 and older to request care online for non-urgent symptoms. For details visit [email or web address].   Also download the MyChart app! Go to the app store, search "MyChart", open the app, select [facility], and log in with your MyChart username and password.  Due to Covid, a mask is required upon entering the hospital/clinic. If you do not have a mask, one will be given to you upon arrival. For doctor visits, patients may have 1 support person aged 67 or older with them. For treatment visits, patients cannot have anyone with them due to current Covid guidelines and our immunocompromised population.

## 2021-03-08 NOTE — Progress Notes (Signed)
Verbal order received from [facility] / Dr. Delton Coombes to proceed with today' treatment: Zometa '3mg'$ s IV and Velcade. Patient does not use Compazine prior to Velcade.   Velcade injectiona and Zometa given today per MD orders. Tolerated infusion without adverse affects. Vital signs stable. No complaints at this time. Discharged from clinic ambulatory in stable condition. Alert and oriented x 3. F/U with [facility] as scheduled.

## 2021-03-08 NOTE — Patient Instructions (Addendum)
[place] at [facility] Discharge Instructions  You were seen today by Dr. Delton Coombes. He went over your recent results, and you received your treatment. You will be scheduled for an MRI of your lower back prior to your next appointment. Dr. Delton Coombes will see you back in 3 months for labs and follow up.   Thank you for choosing [facility] at [facility] to provide your oncology and hematology care.  To afford each patient quality time with our provider, please arrive at least 15 minutes before your scheduled appointment time.   If you have a lab appointment with the [facility] please come in thru the Main Entrance and check in at the main information desk  You need to re-schedule your appointment should you arrive 10 or more minutes late.  We strive to give you quality time with our providers, and arriving late affects you and other patients whose appointments are after yours.  Also, if you no show three or more times for appointments you may be dismissed from the clinic at the providers discretion.     Again, thank you for choosing [facility].  Our hope is that these requests will decrease the amount of time that you wait before being seen by our physicians.       _____________________________________________________________  Should you have questions after your visit to [facility], please contact our office at [phone] between the hours of 8:00 a.m. and 4:30 p.m.  Voicemails left after 4:00 p.m. will not be returned until the following business day.  For prescription refill requests, have your pharmacy contact our office and allow 72 hours.    Cancer Center Support Programs:   > Cancer Support Group  2nd Tuesday of the month 1pm-2pm, Journey Room

## 2021-03-08 NOTE — Progress Notes (Signed)
Patient has been examined, vital signs and labs have been reviewed by Dr. Delton Coombes. ANC, Creatinine, LFTs, hemoglobin, and platelets are within treatment parameters per Dr. Delton Coombes. Patient is okay to proceed with treatment and Zometa per M.D.

## 2021-03-09 LAB — KAPPA/LAMBDA LIGHT CHAINS
Kappa free light chain: 22.5 mg/L — ABNORMAL HIGH (ref 3.3–19.4)
Kappa, lambda light chain ratio: 1.23 (ref 0.26–1.65)
Lambda free light chains: 18.3 mg/L (ref 5.7–26.3)

## 2021-03-10 LAB — IMMUNOFIXATION ELECTROPHORESIS
IgA: 90 mg/dL (ref 87–352)
IgG (Immunoglobin G), Serum: 773 mg/dL (ref 586–1602)
IgM (Immunoglobulin M), Srm: 19 mg/dL — ABNORMAL LOW (ref 26–217)
Total Protein ELP: 6.4 g/dL (ref 6.0–8.5)

## 2021-03-14 LAB — PROTEIN ELECTROPHORESIS, SERUM
A/G Ratio: 1.4 (ref 0.7–1.7)
A/G Ratio: 1.4 (ref 0.7–1.7)
Albumin ELP: 3.6 g/dL (ref 2.9–4.4)
Albumin ELP: 3.7 g/dL (ref 2.9–4.4)
Alpha-1-Globulin: 0.2 g/dL (ref 0.0–0.4)
Alpha-1-Globulin: 0.2 g/dL (ref 0.0–0.4)
Alpha-2-Globulin: 0.7 g/dL (ref 0.4–1.0)
Alpha-2-Globulin: 0.7 g/dL (ref 0.4–1.0)
Beta Globulin: 1 g/dL (ref 0.7–1.3)
Beta Globulin: 1 g/dL (ref 0.7–1.3)
Gamma Globulin: 0.7 g/dL (ref 0.4–1.8)
Gamma Globulin: 0.7 g/dL (ref 0.4–1.8)
Globulin, Total: 2.6 g/dL (ref 2.2–3.9)
Globulin, Total: 2.6 g/dL (ref 2.2–3.9)
Total Protein ELP: 6.2 g/dL (ref 6.0–8.5)
Total Protein ELP: 6.3 g/dL (ref 6.0–8.5)

## 2021-03-17 ENCOUNTER — Other Ambulatory Visit: Payer: Self-pay

## 2021-03-17 ENCOUNTER — Ambulatory Visit (HOSPITAL_COMMUNITY)
Admission: RE | Admit: 2021-03-17 | Discharge: 2021-03-17 | Disposition: A | Discharge status: Home / Self Care | Payer: BC Managed Care – PPO | Source: Ambulatory Visit | Attending: Hematology | Admitting: Hematology

## 2021-03-17 DIAGNOSIS — C9000 Multiple myeloma not having achieved remission: Secondary | ICD-10-CM

## 2021-03-17 DIAGNOSIS — M545 Low back pain, unspecified: Secondary | ICD-10-CM | POA: Diagnosis present

## 2021-03-17 MED ORDER — GADOBUTROL 1 MMOL/ML IV SOLN
10.0000 mL | Freq: Once | INTRAVENOUS | Status: AC | PRN
  Administered 2021-03-17: 10 mL via INTRAVENOUS

## 2021-03-21 ENCOUNTER — Other Ambulatory Visit (HOSPITAL_COMMUNITY): Payer: Self-pay | Admitting: Surgery

## 2021-03-21 DIAGNOSIS — C9000 Multiple myeloma not having achieved remission: Secondary | ICD-10-CM

## 2021-03-22 ENCOUNTER — Inpatient Hospital Stay (HOSPITAL_COMMUNITY): Payer: BC Managed Care – PPO

## 2021-03-22 ENCOUNTER — Inpatient Hospital Stay (HOSPITAL_COMMUNITY): Payer: BC Managed Care – PPO | Attending: Hematology

## 2021-03-22 ENCOUNTER — Other Ambulatory Visit: Payer: Self-pay

## 2021-03-22 VITALS — BP 126/80 | HR 69 | Temp 97.5°F | Resp 20 | Wt 261.7 lb

## 2021-03-22 DIAGNOSIS — Z79899 Other long term (current) drug therapy: Secondary | ICD-10-CM | POA: Insufficient documentation

## 2021-03-22 DIAGNOSIS — C9000 Multiple myeloma not having achieved remission: Secondary | ICD-10-CM

## 2021-03-22 DIAGNOSIS — Z5112 Encounter for antineoplastic immunotherapy: Secondary | ICD-10-CM | POA: Insufficient documentation

## 2021-03-22 LAB — CBC WITH DIFFERENTIAL/PLATELET
Abs Immature Granulocytes: 0.02 10*3/uL (ref 0.00–0.07)
Basophils Absolute: 0 10*3/uL (ref 0.0–0.1)
Basophils Relative: 1 %
Eosinophils Absolute: 0 10*3/uL (ref 0.0–0.5)
Eosinophils Relative: 1 %
HCT: 37.5 % (ref 36.0–46.0)
Hemoglobin: 12 g/dL (ref 12.0–15.0)
Immature Granulocytes: 0 %
Lymphocytes Relative: 25 %
Lymphs Abs: 1.7 10*3/uL (ref 0.7–4.0)
MCH: 31.3 pg (ref 26.0–34.0)
MCHC: 32 g/dL (ref 30.0–36.0)
MCV: 97.9 fL (ref 80.0–100.0)
Monocytes Absolute: 0.5 10*3/uL (ref 0.1–1.0)
Monocytes Relative: 8 %
Neutro Abs: 4.4 10*3/uL (ref 1.7–7.7)
Neutrophils Relative %: 65 %
Platelets: 160 10*3/uL (ref 150–400)
RBC: 3.83 MIL/uL — ABNORMAL LOW (ref 3.87–5.11)
RDW: 15 % (ref 11.5–15.5)
WBC: 6.6 10*3/uL (ref 4.0–10.5)
nRBC: 0 % (ref 0.0–0.2)

## 2021-03-22 LAB — COMPREHENSIVE METABOLIC PANEL
ALT: 15 U/L (ref 0–44)
AST: 17 U/L (ref 15–41)
Albumin: 4 g/dL (ref 3.5–5.0)
Alkaline Phosphatase: 72 U/L (ref 38–126)
Anion gap: 11 (ref 5–15)
BUN: 23 mg/dL — ABNORMAL HIGH (ref 6–20)
CO2: 26 mmol/L (ref 22–32)
Calcium: 9.2 mg/dL (ref 8.9–10.3)
Chloride: 105 mmol/L (ref 98–111)
Creatinine, Ser: 1.15 mg/dL — ABNORMAL HIGH (ref 0.44–1.00)
GFR, Estimated: 56 mL/min — ABNORMAL LOW (ref >60)
Glucose, Bld: 127 mg/dL — ABNORMAL HIGH (ref 70–99)
Potassium: 3.9 mmol/L (ref 3.5–5.1)
Sodium: 142 mmol/L (ref 135–145)
Total Bilirubin: 0.5 mg/dL (ref 0.3–1.2)
Total Protein: 6.9 g/dL (ref 6.5–8.1)

## 2021-03-22 LAB — MAGNESIUM: Magnesium: 2 mg/dL (ref 1.7–2.4)

## 2021-03-22 MED ORDER — BORTEZOMIB CHEMO SQ INJECTION 3.5 MG (2.5MG/ML)
1.0400 mg/m2 | Freq: Once | INTRAMUSCULAR | Status: AC
  Administered 2021-03-22: 2.5 mg via SUBCUTANEOUS
  Filled 2021-03-22: qty 1

## 2021-03-22 NOTE — Progress Notes (Signed)
Labs reviewed, they meet parameters for treatment per protocol. Will proceed with velcade as planned.    Treatment given per orders. Patient tolerated it well without problems. Vitals stable and discharged home from clinic ambulatory. Follow up as scheduled.

## 2021-03-22 NOTE — Patient Instructions (Signed)
[facility]  Discharge Instructions: Thank you for choosing [facility] to provide your oncology and hematology care.  If you have a lab appointment with the Cancer Center, please come in thru the Main Entrance and check in at the main information desk.  Wear comfortable clothing and clothing appropriate for easy access to any Portacath or PICC line.   We strive to give you quality time with your provider. You may need to reschedule your appointment if you arrive late (15 or more minutes).  Arriving late affects you and other patients whose appointments are after yours.  Also, if you miss three or more appointments without notifying the office, you may be dismissed from the clinic at the provider's discretion.      For prescription refill requests, have your pharmacy contact our office and allow 72 hours for refills to be completed.        To help prevent nausea and vomiting after your treatment, we encourage you to take your nausea medication as directed.  BELOW ARE SYMPTOMS THAT SHOULD BE REPORTED IMMEDIATELY: *FEVER GREATER THAN 100.4 F (38 C) OR HIGHER *CHILLS OR SWEATING *NAUSEA AND VOMITING THAT IS NOT CONTROLLED WITH YOUR NAUSEA MEDICATION *UNUSUAL SHORTNESS OF BREATH *UNUSUAL BRUISING OR BLEEDING *URINARY PROBLEMS (pain or burning when urinating, or frequent urination) *BOWEL PROBLEMS (unusual diarrhea, constipation, pain near the anus) TENDERNESS IN MOUTH AND THROAT WITH OR WITHOUT PRESENCE OF ULCERS (sore throat, sores in mouth, or a toothache) UNUSUAL RASH, SWELLING OR PAIN  UNUSUAL VAGINAL DISCHARGE OR ITCHING   Items with * indicate a potential emergency and should be followed up as soon as possible or go to the Emergency Department if any problems should occur.  Please show the CHEMOTHERAPY ALERT CARD or IMMUNOTHERAPY ALERT CARD at check-in to the Emergency Department and triage nurse.  Should you have questions after your visit or need to cancel  or reschedule your appointment, please contact [facility] [phone]  and follow the prompts.  Office hours are 8:00 a.m. to 4:30 p.m. Monday - Friday. Please note that voicemails left after 4:00 p.m. may not be returned until the following business day.  We are closed weekends and major holidays. You have access to a nurse at all times for urgent questions. Please call the main number to the clinic [phone] and follow the prompts.  For any non-urgent questions, you may also contact your provider using MyChart. We now offer e-Visits for anyone 18 and older to request care online for non-urgent symptoms. For details visit [email or web address].   Also download the MyChart app! Go to the app store, search "MyChart", open the app, select [facility], and log in with your MyChart username and password.  Due to Covid, a mask is required upon entering the hospital/clinic. If you do not have a mask, one will be given to you upon arrival. For doctor visits, patients may have 1 support person aged 18 or older with them. For treatment visits, patients cannot have anyone with them due to current Covid guidelines and our immunocompromised population.  

## 2021-04-05 ENCOUNTER — Ambulatory Visit (INDEPENDENT_AMBULATORY_CARE_PROVIDER_SITE_OTHER): Payer: BC Managed Care – PPO | Admitting: Internal Medicine

## 2021-04-05 ENCOUNTER — Inpatient Hospital Stay (HOSPITAL_COMMUNITY): Payer: BC Managed Care – PPO

## 2021-04-05 ENCOUNTER — Other Ambulatory Visit: Payer: Self-pay

## 2021-04-05 ENCOUNTER — Encounter: Payer: Self-pay | Admitting: Internal Medicine

## 2021-04-05 ENCOUNTER — Encounter (HOSPITAL_COMMUNITY): Payer: Self-pay

## 2021-04-05 VITALS — BP 129/83 | HR 72 | Temp 97.7°F | Resp 18 | Ht 64.0 in | Wt 266.1 lb

## 2021-04-05 VITALS — BP 146/80 | HR 67 | Temp 96.4°F | Resp 18 | Wt 265.5 lb

## 2021-04-05 DIAGNOSIS — F430 Acute stress reaction: Secondary | ICD-10-CM

## 2021-04-05 DIAGNOSIS — S32010A Wedge compression fracture of first lumbar vertebra, initial encounter for closed fracture: Secondary | ICD-10-CM | POA: Insufficient documentation

## 2021-04-05 DIAGNOSIS — C9000 Multiple myeloma not having achieved remission: Secondary | ICD-10-CM

## 2021-04-05 DIAGNOSIS — I6930 Unspecified sequelae of cerebral infarction: Secondary | ICD-10-CM

## 2021-04-05 DIAGNOSIS — Z7689 Persons encountering health services in other specified circumstances: Secondary | ICD-10-CM | POA: Diagnosis not present

## 2021-04-05 DIAGNOSIS — Z9481 Bone marrow transplant status: Secondary | ICD-10-CM

## 2021-04-05 DIAGNOSIS — I10 Essential (primary) hypertension: Secondary | ICD-10-CM

## 2021-04-05 DIAGNOSIS — Z5112 Encounter for antineoplastic immunotherapy: Secondary | ICD-10-CM | POA: Diagnosis not present

## 2021-04-05 DIAGNOSIS — S32010D Wedge compression fracture of first lumbar vertebra, subsequent encounter for fracture with routine healing: Secondary | ICD-10-CM | POA: Diagnosis not present

## 2021-04-05 DIAGNOSIS — G620 Drug-induced polyneuropathy: Secondary | ICD-10-CM

## 2021-04-05 DIAGNOSIS — Z6841 Body Mass Index (BMI) 40.0 and over, adult: Secondary | ICD-10-CM

## 2021-04-05 DIAGNOSIS — E1169 Type 2 diabetes mellitus with other specified complication: Secondary | ICD-10-CM

## 2021-04-05 DIAGNOSIS — K219 Gastro-esophageal reflux disease without esophagitis: Secondary | ICD-10-CM | POA: Diagnosis not present

## 2021-04-05 DIAGNOSIS — F411 Generalized anxiety disorder: Secondary | ICD-10-CM

## 2021-04-05 LAB — CBC WITH DIFFERENTIAL/PLATELET
Abs Immature Granulocytes: 0.01 10*3/uL (ref 0.00–0.07)
Basophils Absolute: 0 10*3/uL (ref 0.0–0.1)
Basophils Relative: 1 %
Eosinophils Absolute: 0 10*3/uL (ref 0.0–0.5)
Eosinophils Relative: 1 %
HCT: 35.7 % — ABNORMAL LOW (ref 36.0–46.0)
Hemoglobin: 11.5 g/dL — ABNORMAL LOW (ref 12.0–15.0)
Immature Granulocytes: 0 %
Lymphocytes Relative: 31 %
Lymphs Abs: 1.6 10*3/uL (ref 0.7–4.0)
MCH: 31.9 pg (ref 26.0–34.0)
MCHC: 32.2 g/dL (ref 30.0–36.0)
MCV: 98.9 fL (ref 80.0–100.0)
Monocytes Absolute: 0.5 10*3/uL (ref 0.1–1.0)
Monocytes Relative: 10 %
Neutro Abs: 2.9 10*3/uL (ref 1.7–7.7)
Neutrophils Relative %: 57 %
Platelets: 169 10*3/uL (ref 150–400)
RBC: 3.61 MIL/uL — ABNORMAL LOW (ref 3.87–5.11)
RDW: 14.7 % (ref 11.5–15.5)
WBC: 5 10*3/uL (ref 4.0–10.5)
nRBC: 0 % (ref 0.0–0.2)

## 2021-04-05 LAB — COMPREHENSIVE METABOLIC PANEL
ALT: 15 U/L (ref 0–44)
AST: 18 U/L (ref 15–41)
Albumin: 3.9 g/dL (ref 3.5–5.0)
Alkaline Phosphatase: 70 U/L (ref 38–126)
Anion gap: 8 (ref 5–15)
BUN: 20 mg/dL (ref 6–20)
CO2: 25 mmol/L (ref 22–32)
Calcium: 8.9 mg/dL (ref 8.9–10.3)
Chloride: 107 mmol/L (ref 98–111)
Creatinine, Ser: 0.91 mg/dL (ref 0.44–1.00)
GFR, Estimated: >60 mL/min (ref >60)
Glucose, Bld: 85 mg/dL (ref 70–99)
Potassium: 3.9 mmol/L (ref 3.5–5.1)
Sodium: 140 mmol/L (ref 135–145)
Total Bilirubin: 0.4 mg/dL (ref 0.3–1.2)
Total Protein: 6.6 g/dL (ref 6.5–8.1)

## 2021-04-05 MED ORDER — BORTEZOMIB CHEMO SQ INJECTION 3.5 MG (2.5MG/ML)
1.0400 mg/m2 | Freq: Once | INTRAMUSCULAR | Status: AC
  Administered 2021-04-05: 2.5 mg via SUBCUTANEOUS
  Filled 2021-04-05: qty 1

## 2021-04-05 NOTE — Progress Notes (Signed)
Patient presents today for Velcade, patient took compazine at home. Patient tolerated Velcade injection with no complaints voiced. Lab work reviewed. See MAR for details. Injection site clean and dry with no bruising or swelling noted. Patient stable during and after injection. Band aid applied. VSS. Patient left in satisfactory condition with no s/s of distress noted.

## 2021-04-05 NOTE — Patient Instructions (Signed)
[place]  Discharge Instructions: Thank you for choosing [facility] to provide your oncology and hematology care.  If you have a lab appointment with the [facility], please come in thru the Main Entrance and check in at the main information desk.  Wear comfortable clothing and clothing appropriate for easy access to any Portacath or PICC line.   We strive to give you quality time with your provider. You may need to reschedule your appointment if you arrive late (15 or more minutes).  Arriving late affects you and other patients whose appointments are after yours.  Also, if you miss three or more appointments without notifying the office, you may be dismissed from the clinic at the provider's discretion.      For prescription refill requests, have your pharmacy contact our office and allow 72 hours for refills to be completed.    Today you received the following chemotherapy and/or immunotherapy agents Velcade. Return as scheduled.   To help prevent nausea and vomiting after your treatment, we encourage you to take your nausea medication as directed.  BELOW ARE SYMPTOMS THAT SHOULD BE REPORTED IMMEDIATELY: *FEVER GREATER THAN 100.4 F (38 C) OR HIGHER *CHILLS OR SWEATING *NAUSEA AND VOMITING THAT IS NOT CONTROLLED WITH YOUR NAUSEA MEDICATION *UNUSUAL SHORTNESS OF BREATH *UNUSUAL BRUISING OR BLEEDING *URINARY PROBLEMS (pain or burning when urinating, or frequent urination) *BOWEL PROBLEMS (unusual diarrhea, constipation, pain near the anus) TENDERNESS IN MOUTH AND THROAT WITH OR WITHOUT PRESENCE OF ULCERS (sore throat, sores in mouth, or a toothache) UNUSUAL RASH, SWELLING OR PAIN  UNUSUAL VAGINAL DISCHARGE OR ITCHING   Items with * indicate a potential emergency and should be followed up as soon as possible or go to the Emergency Department if any problems should occur.  Please show the CHEMOTHERAPY ALERT CARD or IMMUNOTHERAPY ALERT CARD at check-in to the  Emergency Department and triage nurse.  Should you have questions after your visit or need to cancel or reschedule your appointment, please contact [facility] [phone]  and follow the prompts.  Office hours are 8:00 a.m. to 4:30 p.m. Monday - Friday. Please note that voicemails left after 4:00 p.m. may not be returned until the following business day.  We are closed weekends and major holidays. You have access to a nurse at all times for urgent questions. Please call the main number to the clinic [phone] and follow the prompts.  For any non-urgent questions, you may also contact your provider using MyChart. We now offer e-Visits for anyone 2 and older to request care online for non-urgent symptoms. For details visit [email or web address].   Also download the MyChart app! Go to the app store, search "MyChart", open the app, select [facility], and log in with your MyChart username and password.  Due to Covid, a mask is required upon entering the hospital/clinic. If you do not have a mask, one will be given to you upon arrival. For doctor visits, patients may have 1 support person aged 25 or older with them. For treatment visits, patients cannot have anyone with them due to current Covid guidelines and our immunocompromised population.

## 2021-04-05 NOTE — Assessment & Plan Note (Addendum)
Undergoing treatment for it, followed by Oncology On PPX antiviral and Macrobid

## 2021-04-05 NOTE — Assessment & Plan Note (Signed)
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

## 2021-04-05 NOTE — Assessment & Plan Note (Signed)
For Multiple myeloma Followed by oncology

## 2021-04-05 NOTE — Patient Instructions (Signed)
Please continue to take medications as prescribed.  Continue to follow low salt diet and ambulate as tolerated.

## 2021-04-05 NOTE — Assessment & Plan Note (Addendum)
BP Readings from Last 1 Encounters:  04/05/21 129/83   Well-controlled with Amlodipine, Losartan and Coreg Followed by Cardiology at [facility] for compliance with the medications Advised DASH diet and moderate exercise/walking as tolerated

## 2021-04-05 NOTE — Assessment & Plan Note (Signed)
On pantoprazole

## 2021-04-05 NOTE — Progress Notes (Signed)
New Patient Office Visit  Subjective:  Patient ID: Joanna Reid, female    DOB: 02-16-1966  Age: 55 y.o. 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  HTN: BP is well-controlled. Takes medications regularly. Patient denies headache, dizziness, chest pain, dyspnea or palpitations.  She is undergoing chemotherapy for MM, and is followed by oncology.  She has been having chronic low back pain, for which she had an MRI of lumbar spine recently -showed T12-L1 fracture, nontraumatic.  Of note, she is on zoledronic acid for osteoporosis.  She is going to see orthopedic surgeon for it.  She denies any weakness of the LE currently.  Denies any saddle anesthesia or urinary or stool incontinence.  She has had CVA in the past with residual speech difficulty.  She takes aspirin and Crestor.  Denies any other focal neurologic deficits currently.  She has history of drug-induced neuropathy, for which she takes Lyrica.  DM: She developed steroid-induced hyperglycemia during treatment for MM.  Her HbA1c was 5.4 in 02/2021.  She is on metformin and Trulicity.  She sees endocrinology at [facility].  She denies any polyuria or polydipsia.  She has a history of anxiety, for which she takes Lexapro and as needed Valium.  She denies any anhedonia, SI or HI currently.  She has had 3 doses of COVID-vaccine.  I  Past Medical History:  Diagnosis Date   Acid reflux    Allergic rhinitis    Cancer ([place])    multiple myeloma   Diabetes mellitus    type 2   Gout    Gout    H/o COVID-19--- was Positive 09/17/2019, Negative 12/23/19 AND also Neg on  12/26/19 12/26/2019    HBP (high blood pressure)    History of kidney stones    Migraines     Past Surgical History:  Procedure Laterality Date   BREAST CYST EXCISION Left    2009 no visible scar on skin   CESAREAN SECTION     COLONOSCOPY WITH PROPOFOL N/A 12/25/2019   Procedure: COLONOSCOPY WITH PROPOFOL;  Surgeon: Rogene Houston, MD;  Location: [facility];  Service: Endoscopy;  Laterality: N/A;  730   EXTRACORPOREAL SHOCK WAVE LITHOTRIPSY Left 10/10/2017   Procedure: LEFT EXTRACORPOREAL SHOCK WAVE LITHOTRIPSY (ESWL);  Surgeon: Bjorn Loser, MD;  Location: [facility] ORS;  Service: Urology;  Laterality: Left;   EYE SURGERY     HEMORRHOID SURGERY N/A 11/19/2012   Procedure: HEMORRHOIDECTOMY;  Surgeon: Jamesetta So, MD;  Location: [facility] ORS;  Service: General;  Laterality: N/A;   kidney stones  1998   LAPAROSCOPIC UNILATERAL SALPINGO OOPHERECTOMY  05/14/2012   Procedure: LAPAROSCOPIC UNILATERAL SALPINGO OOPHORECTOMY;  Surgeon: [name], MD;  Location: [facility] ORS;  Service: Gynecology;  Laterality: Right;  laparoscopic right salpingo-oophorectomy   PARTIAL HYSTERECTOMY     POLYPECTOMY  12/25/2019   Procedure: POLYPECTOMY;  Surgeon: Rogene Houston, MD;  Location: [facility];  Service: Endoscopy;;   TONSILECTOMY, ADENOIDECTOMY, BILATERAL MYRINGOTOMY AND TUBES     VESICOVAGINAL FISTULA CLOSURE W/ TAH      Family History  Problem Relation Age of Onset  Arthritis Other    Cancer Other    Diabetes Other    Hypertension Mother    Dementia Mother    Diabetes Father    ALS Father    Diabetes Brother    Hypertension Brother    Cancer Paternal Aunt    COPD Maternal Grandmother    Cancer Maternal Grandfather    Anesthesia problems Paternal Grandfather     Social History   Socioeconomic History   Marital status: Married    Spouse name: Not on file   Number of children: Not on file   Years of education: 12   Highest education level: Not on file  Occupational History    Employer: [employer]  Tobacco Use    Smoking status: Former    Types: Cigarettes    Quit date: 02/27/1999    Years since quitting: 22.1   Smokeless tobacco: Never   Tobacco comments:    socially   Vaping Use   Vaping Use: Never used  Substance and Sexual Activity   Alcohol use: No    Alcohol/week: 0.0 standard drinks   Drug use: No   Sexual activity: Yes    Birth control/protection: Surgical    Comment: hyst  Other Topics Concern   Not on file  Social History Narrative   Not on file   Social Determinants of Health   Financial Resource Strain: Not on file  Food Insecurity: Not on file  Transportation Needs: Not on file  Physical Activity: Not on file  Stress: Not on file  Social Connections: Not on file  Intimate Partner Violence: Not on file    ROS Review of Systems  Constitutional:  Negative for chills and fever.  HENT:  Negative for congestion, sinus pressure, sinus pain and sore throat.   Eyes:  Negative for pain and discharge.  Respiratory:  Negative for cough and shortness of breath.   Cardiovascular:  Negative for chest pain and palpitations.  Gastrointestinal:  Negative for abdominal pain, diarrhea, nausea and vomiting.  Endocrine: Negative for polydipsia and polyuria.  Genitourinary:  Negative for dysuria and hematuria.  Musculoskeletal:  Positive for arthralgias and back pain. Negative for neck pain and neck stiffness.  Skin:  Negative for rash.  Neurological:  Positive for numbness. Negative for dizziness and weakness.  Psychiatric/Behavioral:  Negative for agitation and behavioral problems.    Objective:   Today's Vitals: BP 129/83 (BP Location: Left Arm, Patient Position: Sitting, Cuff Size: Normal)   Pulse 72   Temp 97.7 F (36.5 C) (Oral)   Resp 18   Ht 5' 4"  (1.626 m)   Wt 266 lb 1.9 oz (120.7 kg)   SpO2 92%   BMI 45.68 kg/m   Physical Exam Vitals reviewed.  Constitutional:      General: She is not in acute distress.    Appearance: She is obese. She is not diaphoretic.   HENT:     Head: Normocephalic and atraumatic.     Nose: Nose normal.     Mouth/Throat:     Mouth: Mucous membranes are moist.  Eyes:     General: No scleral icterus.    Extraocular Movements: Extraocular movements intact.  Cardiovascular:     Rate and Rhythm: Normal rate and regular rhythm.     Pulses: Normal pulses.     Heart sounds: Normal heart sounds. No murmur heard. Pulmonary:     Breath sounds: Normal breath sounds. No wheezing or rales.  Abdominal:     Palpations: Abdomen is soft.  Tenderness: There is no abdominal tenderness.  Musculoskeletal:        General: Tenderness (Lumbar spine area) present.     Cervical back: Neck supple. No tenderness.     Right lower leg: No edema.     Left lower leg: No edema.  Skin:    General: Skin is warm.     Findings: No rash.  Neurological:     General: No focal deficit present.     Mental Status: She is alert and oriented to person, place, and time. Mental status is at baseline.     Motor: No weakness.     Gait: Gait normal.  Psychiatric:        Mood and Affect: Mood normal.        Behavior: Behavior normal.    Assessment & Plan:   Problem List Items Addressed This Visit       Morbid obesity with BMI of 45.0-49.9, adult (HCC)   Diet modification and moderate exercise as tolerated On Trulicity for DM       Cardiovascular and Mediastinum   Essential hypertension, benign    BP Readings from Last 1 Encounters:  04/05/21 129/83  Well-controlled with Amlodipine, Losartan and Coreg Followed by Cardiology at [facility] for compliance with the medications Advised DASH diet and moderate exercise/walking as tolerated        Digestive   GERD (gastroesophageal reflux disease)    On pantoprazole        Endocrine   Type 2 diabetes mellitus with other specified complication ([place])    Likely steroid-induced HbA1C: 5.4 (02/2021) Followed by endocrinology at [facility] On metformin and Trulicity On  statin and ARB Advised to follow diabetic diet Diabetic eye exam: Advised to follow up with Ophthalmology for diabetic eye exam         Nervous and Auditory   Drug-induced polyneuropathy ([place])    Likely due to chemotherapy On Lyrica        Musculoskeletal and Integument   Compression fracture of L1 lumbar vertebra ([place])    Recently noticed on MRI She is going to see orthopedic surgeon for it On zoledronic acid for osteoporosis        Other   Anxiety as acute reaction to exceptional stress    Well-controlled with Lexapro now Does not require Valium frequently      Multiple myeloma ([place])    Undergoing treatment for it, followed by Oncology On PPX antiviral and Macrobid      Encounter to establish care - Primary    Care established Previous chart reviewed History and medications reviewed with the patient            S/P autologous bone marrow transplantation ([place])    For Multiple myeloma Followed by oncology      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   albuterol (VENTOLIN HFA) 108 (90 Base) MCG/ACT inhaler Inhale 2 puffs into the lungs every 4 (four) hours as needed for wheezing or shortness of breath.   allopurinol (ZYLOPRIM) 300 MG tablet TAKE (1) TABLET BY MOUTH ONCE DAILY.   amLODipine (NORVASC) 2.5 MG tablet Take 2.5 mg by mouth daily.   aspirin  EC 81 MG tablet Take 1 tablet (81 mg total) by mouth daily.   bumetanide (BUMEX) 0.5 MG tablet Take 0.5 mg by mouth daily.    carvedilol (COREG) 25 MG tablet Take 1 tablet (25 mg total) by mouth 2 (two) times daily.   diazepam (VALIUM) 5 MG tablet Take 1 tablet (5 mg total) by mouth at bedtime.   Docosanol 10 % CREA Apply 1 application topically daily as needed  (for nose).    docusate sodium (COLACE) 100 MG capsule Take 100 mg by mouth daily.    erythromycin ophthalmic ointment Place 1 application into the left eye at bedtime.   escitalopram (LEXAPRO) 10 MG tablet TAKE (1) TABLET BY MOUTH ONCE DAILY. MAY START WITH 1/2 TABLET FOR 7 DAYS.   EUFLEXXA 20 MG/2ML SOSY SMARTSIG:1 Syringe(s) I-ARTIC Once a Week   fluticasone (FLONASE) 50 MCG/ACT nasal spray INSTILL 2 SPRAYS INTO BOTH NOSTRILS DAILY   losartan (COZAAR) 100 MG tablet Take 100 mg by mouth daily.   magnesium oxide (MAG-OX) 400 (241.3 Mg) MG tablet Take 1 tablet (400 mg total) by mouth 3 (three) times daily.   metFORMIN (GLUCOPHAGE) 500 MG tablet TAKE 1 TABLET BY MOUTH ONCE DAILY WITH BREAKFAST (Patient taking differently: Take 1,000 mg by mouth 2 (two) times daily with a meal.)   nitrofurantoin, macrocrystal-monohydrate, (MACROBID) 100 MG capsule Take 1 capsule (100 mg total) by mouth at bedtime.   ondansetron (ZOFRAN) 8 MG tablet TAKE 1 TABLET BY MOUTH EVERY 8 HOURS AS NEEDED.   pantoprazole (PROTONIX) 40 MG tablet TAKE (1) TABLET BY MOUTH ONCE DAILY.   pregabalin (LYRICA) 200 MG capsule TAKE (1) CAPSULE BY MOUTH TWICE DAILY.   prochlorperazine (COMPAZINE) 10 MG tablet TAKE 1 TABLET BY MOUTH EVERY 6 HOURS AS NEEDED FOR NAUSEA OR VOMITING.   Propylene Glycol (SYSTANE BALANCE) 0.6 % SOLN Apply 1 drop to eye daily as needed (dry eye).    rosuvastatin (CRESTOR) 5 MG tablet Take 5 mg by mouth daily.   TRULICITY 1.5 SL/3.7DS SOPN Inject 0.75 mg into the skin every Monday.    valACYclovir (VALTREX) 1000 MG tablet TAKE 2 TABLETS BY MOUTH NOW; THEN 2 12 HOURS LATER.   VELCADE 3.5 MG injection Inject 3.5 mg into the vein every 14 (fourteen) days.   [DISCONTINUED] amLODipine (NORVASC) 5 MG tablet Take 5 mg by mouth daily.   No facility-administered encounter medications on file as of 04/05/2021.    Follow-up: Return in about 4 months (around 08/05/2021) for HTN, DM and anxiety.   Lindell Spar, MD

## 2021-04-05 NOTE — Assessment & Plan Note (Signed)
Likely steroid-induced HbA1C: 5.4 (02/2021) Followed by endocrinology at [facility] On metformin and Trulicity On statin and ARB Advised to follow diabetic diet Diabetic eye exam: Advised to follow up with Ophthalmology for diabetic eye exam

## 2021-04-05 NOTE — Assessment & Plan Note (Signed)
Diet modification and moderate exercise as tolerated On Trulicity for DM

## 2021-04-05 NOTE — Assessment & Plan Note (Signed)
Well-controlled with Lexapro now Does not require Valium frequently

## 2021-04-05 NOTE — Assessment & Plan Note (Signed)
Care established Previous chart reviewed History and medications reviewed with the patient 

## 2021-04-05 NOTE — Assessment & Plan Note (Signed)
Likely due to chemotherapy On Lyrica

## 2021-04-05 NOTE — Assessment & Plan Note (Signed)
Recently noticed on MRI She is going to see orthopedic surgeon for it On zoledronic acid for osteoporosis

## 2021-04-18 ENCOUNTER — Other Ambulatory Visit (HOSPITAL_COMMUNITY): Payer: Self-pay | Admitting: *Deleted

## 2021-04-18 DIAGNOSIS — C9000 Multiple myeloma not having achieved remission: Secondary | ICD-10-CM

## 2021-04-19 ENCOUNTER — Other Ambulatory Visit: Payer: Self-pay

## 2021-04-19 ENCOUNTER — Inpatient Hospital Stay (HOSPITAL_COMMUNITY): Payer: BC Managed Care – PPO | Attending: Hematology

## 2021-04-19 ENCOUNTER — Encounter (HOSPITAL_COMMUNITY): Payer: Self-pay

## 2021-04-19 ENCOUNTER — Inpatient Hospital Stay (HOSPITAL_COMMUNITY): Payer: BC Managed Care – PPO

## 2021-04-19 VITALS — BP 124/81 | HR 73 | Temp 96.9°F | Resp 20 | Wt 264.2 lb

## 2021-04-19 DIAGNOSIS — Z5112 Encounter for antineoplastic immunotherapy: Secondary | ICD-10-CM | POA: Diagnosis present

## 2021-04-19 DIAGNOSIS — Z23 Encounter for immunization: Secondary | ICD-10-CM | POA: Insufficient documentation

## 2021-04-19 DIAGNOSIS — Z79899 Other long term (current) drug therapy: Secondary | ICD-10-CM | POA: Insufficient documentation

## 2021-04-19 DIAGNOSIS — C9000 Multiple myeloma not having achieved remission: Secondary | ICD-10-CM | POA: Diagnosis present

## 2021-04-19 LAB — CBC WITH DIFFERENTIAL/PLATELET
Abs Immature Granulocytes: 0.02 10*3/uL (ref 0.00–0.07)
Basophils Absolute: 0 10*3/uL (ref 0.0–0.1)
Basophils Relative: 1 %
Eosinophils Absolute: 0 10*3/uL (ref 0.0–0.5)
Eosinophils Relative: 1 %
HCT: 38.8 % (ref 36.0–46.0)
Hemoglobin: 12.4 g/dL (ref 12.0–15.0)
Immature Granulocytes: 0 %
Lymphocytes Relative: 32 %
Lymphs Abs: 1.8 10*3/uL (ref 0.7–4.0)
MCH: 30.8 pg (ref 26.0–34.0)
MCHC: 32 g/dL (ref 30.0–36.0)
MCV: 96.5 fL (ref 80.0–100.0)
Monocytes Absolute: 0.6 10*3/uL (ref 0.1–1.0)
Monocytes Relative: 11 %
Neutro Abs: 3.1 10*3/uL (ref 1.7–7.7)
Neutrophils Relative %: 55 %
Platelets: 176 10*3/uL (ref 150–400)
RBC: 4.02 MIL/uL (ref 3.87–5.11)
RDW: 14.7 % (ref 11.5–15.5)
WBC: 5.7 10*3/uL (ref 4.0–10.5)
nRBC: 0 % (ref 0.0–0.2)

## 2021-04-19 LAB — COMPREHENSIVE METABOLIC PANEL
ALT: 13 U/L (ref 0–44)
AST: 18 U/L (ref 15–41)
Albumin: 4.1 g/dL (ref 3.5–5.0)
Alkaline Phosphatase: 76 U/L (ref 38–126)
Anion gap: 9 (ref 5–15)
BUN: 23 mg/dL — ABNORMAL HIGH (ref 6–20)
CO2: 25 mmol/L (ref 22–32)
Calcium: 9.1 mg/dL (ref 8.9–10.3)
Chloride: 106 mmol/L (ref 98–111)
Creatinine, Ser: 1.01 mg/dL — ABNORMAL HIGH (ref 0.44–1.00)
GFR, Estimated: >60 mL/min (ref >60)
Glucose, Bld: 140 mg/dL — ABNORMAL HIGH (ref 70–99)
Potassium: 3.8 mmol/L (ref 3.5–5.1)
Sodium: 140 mmol/L (ref 135–145)
Total Bilirubin: 0.8 mg/dL (ref 0.3–1.2)
Total Protein: 7 g/dL (ref 6.5–8.1)

## 2021-04-19 LAB — MAGNESIUM: Magnesium: 1.8 mg/dL (ref 1.7–2.4)

## 2021-04-19 MED ORDER — BORTEZOMIB CHEMO SQ INJECTION 3.5 MG (2.5MG/ML)
1.0400 mg/m2 | Freq: Once | INTRAMUSCULAR | Status: AC
  Administered 2021-04-19: 2.5 mg via SUBCUTANEOUS
  Filled 2021-04-19: qty 1

## 2021-04-19 NOTE — Patient Instructions (Signed)
Joanna Reid  Discharge Instructions: Thank you for choosing [facility] to provide your oncology and hematology care.  If you have a lab appointment with the [facility], please come in thru the Main Entrance and check in at the main information desk.  Wear comfortable clothing and clothing appropriate for easy access to any Portacath or PICC line.   We strive to give you quality time with your provider. You may need to reschedule your appointment if you arrive late (15 or more minutes).  Arriving late affects you and other patients whose appointments are after yours.  Also, if you miss three or more appointments without notifying the office, you may be dismissed from the clinic at the provider's discretion.      For prescription refill requests, have your pharmacy contact our office and allow 72 hours for refills to be completed.    Today you received the following chemotherapy and/or immunotherapy agents velcade.     To help prevent nausea and vomiting after your treatment, we encourage you to take your nausea medication as directed.  BELOW ARE SYMPTOMS THAT SHOULD BE REPORTED IMMEDIATELY: *FEVER GREATER THAN 100.4 F (38 C) OR HIGHER *CHILLS OR SWEATING *NAUSEA AND VOMITING THAT IS NOT CONTROLLED WITH YOUR NAUSEA MEDICATION *UNUSUAL SHORTNESS OF BREATH *UNUSUAL BRUISING OR BLEEDING *URINARY PROBLEMS (pain or burning when urinating, or frequent urination) *BOWEL PROBLEMS (unusual diarrhea, constipation, pain near the anus) TENDERNESS IN MOUTH AND THROAT WITH OR WITHOUT PRESENCE OF ULCERS (sore throat, sores in mouth, or a toothache) UNUSUAL RASH, SWELLING OR PAIN  UNUSUAL VAGINAL DISCHARGE OR ITCHING   Items with * indicate a potential emergency and should be followed up as soon as possible or go to the Emergency Department if any problems should occur.  Please show the CHEMOTHERAPY ALERT CARD or IMMUNOTHERAPY ALERT CARD at check-in to the Emergency  Department and triage nurse.  Should you have questions after your visit or need to cancel or reschedule your appointment, please contact [facility] [phone]  and follow the prompts.  Office hours are 8:00 a.m. to 4:30 p.m. Monday - Friday. Please note that voicemails left after 4:00 p.m. may not be returned until the following business day.  We are closed weekends and major holidays. You have access to a nurse at all times for urgent questions. Please call the main number to the clinic [phone] and follow the prompts.  For any non-urgent questions, you may also contact your provider using MyChart. We now offer e-Visits for anyone 33 and older to request care online for non-urgent symptoms. For details visit [email or web address].   Also download the MyChart app! Go to the app store, search "MyChart", open the app, select [place], and log in with your MyChart username and password.  Due to Covid, a mask is required upon entering the hospital/clinic. If you do not have a mask, one will be given to you upon arrival. For doctor visits, patients may have 1 support person aged 34 or older with them. For treatment visits, patients cannot have anyone with them due to current Covid guidelines and our immunocompromised population.

## 2021-04-19 NOTE — Progress Notes (Signed)
Patient tolerated Velcade injection with no complaints voiced. Lab work reviewed. See MAR for details. Injection site clean and dry with no bruising or swelling noted. Patient stable during and after injection. Band aid applied. VSS. Patient left in satisfactory condition with no s/s of distress noted. 

## 2021-04-28 ENCOUNTER — Other Ambulatory Visit: Payer: Self-pay | Admitting: Internal Medicine

## 2021-04-28 DIAGNOSIS — F411 Generalized anxiety disorder: Secondary | ICD-10-CM

## 2021-05-01 ENCOUNTER — Emergency Department (HOSPITAL_COMMUNITY)
Admission: EM | Admit: 2021-05-01 | Discharge: 2021-05-01 | Disposition: A | Discharge status: Home / Self Care | Payer: BC Managed Care – PPO | Attending: Emergency Medicine | Admitting: Emergency Medicine

## 2021-05-01 ENCOUNTER — Emergency Department (HOSPITAL_COMMUNITY): Payer: BC Managed Care – PPO

## 2021-05-01 ENCOUNTER — Other Ambulatory Visit: Payer: Self-pay

## 2021-05-01 DIAGNOSIS — I10 Essential (primary) hypertension: Secondary | ICD-10-CM | POA: Insufficient documentation

## 2021-05-01 DIAGNOSIS — R1012 Left upper quadrant pain: Secondary | ICD-10-CM | POA: Insufficient documentation

## 2021-05-01 DIAGNOSIS — Z79899 Other long term (current) drug therapy: Secondary | ICD-10-CM | POA: Insufficient documentation

## 2021-05-01 DIAGNOSIS — K219 Gastro-esophageal reflux disease without esophagitis: Secondary | ICD-10-CM | POA: Diagnosis not present

## 2021-05-01 DIAGNOSIS — Z87891 Personal history of nicotine dependence: Secondary | ICD-10-CM | POA: Diagnosis not present

## 2021-05-01 DIAGNOSIS — Z7984 Long term (current) use of oral hypoglycemic drugs: Secondary | ICD-10-CM | POA: Diagnosis not present

## 2021-05-01 DIAGNOSIS — E1142 Type 2 diabetes mellitus with diabetic polyneuropathy: Secondary | ICD-10-CM | POA: Diagnosis not present

## 2021-05-01 DIAGNOSIS — R109 Unspecified abdominal pain: Secondary | ICD-10-CM

## 2021-05-01 DIAGNOSIS — Z7982 Long term (current) use of aspirin: Secondary | ICD-10-CM | POA: Diagnosis not present

## 2021-05-01 DIAGNOSIS — Z8616 Personal history of COVID-19: Secondary | ICD-10-CM | POA: Insufficient documentation

## 2021-05-01 DIAGNOSIS — Z8579 Personal history of other malignant neoplasms of lymphoid, hematopoietic and related tissues: Secondary | ICD-10-CM | POA: Insufficient documentation

## 2021-05-01 LAB — COMPREHENSIVE METABOLIC PANEL
ALT: 13 U/L (ref 0–44)
AST: 16 U/L (ref 15–41)
Albumin: 3.5 g/dL (ref 3.5–5.0)
Alkaline Phosphatase: 72 U/L (ref 38–126)
Anion gap: 7 (ref 5–15)
BUN: 12 mg/dL (ref 6–20)
CO2: 26 mmol/L (ref 22–32)
Calcium: 9.4 mg/dL (ref 8.9–10.3)
Chloride: 107 mmol/L (ref 98–111)
Creatinine, Ser: 0.99 mg/dL (ref 0.44–1.00)
GFR, Estimated: >60 mL/min (ref >60)
Glucose, Bld: 98 mg/dL (ref 70–99)
Potassium: 3.9 mmol/L (ref 3.5–5.1)
Sodium: 140 mmol/L (ref 135–145)
Total Bilirubin: 0.6 mg/dL (ref 0.3–1.2)
Total Protein: 6 g/dL — ABNORMAL LOW (ref 6.5–8.1)

## 2021-05-01 LAB — CBC
HCT: 36.7 % (ref 36.0–46.0)
Hemoglobin: 12 g/dL (ref 12.0–15.0)
MCH: 30.8 pg (ref 26.0–34.0)
MCHC: 32.7 g/dL (ref 30.0–36.0)
MCV: 94.1 fL (ref 80.0–100.0)
Platelets: 174 10*3/uL (ref 150–400)
RBC: 3.9 MIL/uL (ref 3.87–5.11)
RDW: 14.6 % (ref 11.5–15.5)
WBC: 5.6 10*3/uL (ref 4.0–10.5)
nRBC: 0 % (ref 0.0–0.2)

## 2021-05-01 LAB — URINALYSIS, ROUTINE W REFLEX MICROSCOPIC
Bilirubin Urine: NEGATIVE
Glucose, UA: NEGATIVE mg/dL
Hgb urine dipstick: NEGATIVE
Ketones, ur: NEGATIVE mg/dL
Leukocytes,Ua: NEGATIVE
Nitrite: NEGATIVE
Protein, ur: NEGATIVE mg/dL
Specific Gravity, Urine: 1.01 (ref 1.005–1.030)
pH: 6 (ref 5.0–8.0)

## 2021-05-01 LAB — LIPASE, BLOOD: Lipase: 47 U/L (ref 11–51)

## 2021-05-01 MED ORDER — MORPHINE SULFATE (PF) 4 MG/ML IV SOLN
4.0000 mg | Freq: Once | INTRAVENOUS | Status: AC
  Administered 2021-05-01: 4 mg via INTRAVENOUS
  Filled 2021-05-01: qty 1

## 2021-05-01 MED ORDER — DICLOFENAC SODIUM 1 % EX GEL: 2.0000 g | Freq: Four times a day (QID) | CUTANEOUS | 0 refills | Status: AC

## 2021-05-01 MED ORDER — DICYCLOMINE HCL 20 MG PO TABS: 20.0000 mg | ORAL_TABLET | Freq: Two times a day (BID) | ORAL | 0 refills | Status: DC | PRN

## 2021-05-01 MED ORDER — ONDANSETRON HCL 4 MG/2ML IJ SOLN
4.0000 mg | Freq: Once | INTRAMUSCULAR | Status: AC
  Administered 2021-05-01: 4 mg via INTRAVENOUS
  Filled 2021-05-01: qty 2

## 2021-05-01 NOTE — ED Triage Notes (Signed)
Pt with LLQ abdominal pain with swelling to area x 2 days. Endorses nausea without vomiting. Pt on chemo s/p stem cell transplant for multiple myeloma.

## 2021-05-01 NOTE — ED Provider Notes (Signed)
[facility] EMERGENCY DEPARTMENT Provider Note   CSN: [id number] Arrival date & time: 05/01/21  1211     History Chief Complaint  Patient presents with   Abdominal Pain    Joanna Reid is a 55 y.o. female presenting for evaluation of abdominal pain.  Patient states that the past 4 days she has had pain in the left side of her flank and abdomen.  She also reports associated left upper quadrant swelling.  She has nausea, no vomiting.  No fevers or chills.  History of kidney stones and recurrent UTIs (on macrobid), but states this feels different.  She denies fever, cough, chest pain, shortness of breath, urinary symptoms, normal bowel movements.  She is able to tolerate p.o. without difficulty.  She has taken Tylenol and an old Norco without improvement of symptoms, has not tried anything else.  Nothing makes it better or worse.  Pain does not radiate. Patient with a history of multiple myeloma, currently receiving chemo.  Last treatment was 10 days ago.  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   History of CVA with residual deficit 04/05/2021   Compression fracture of L1 lumbar vertebra ([place]) 04/05/2021   Stroke (cerebrum) -Left parietal lobe, 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  12/26/2019   Hyperlipidemia 08/09/2018   Anemia 08/09/2018   Leukopenia 08/09/2018   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   S/P autologous bone marrow transplantation ([place]) 04/01/2018   Abnormal echocardiogram 02/04/2018   LVH (left  ventricular hypertrophy) 02/04/2018   Morbid obesity with BMI of 45.0-49.9, adult ([place]) 02/04/2018   Encounter to establish care 11/01/2017   Multiple myeloma ([place]) 10/29/2017   Hypercalcemia 10/29/2017   Primary osteoarthritis of right knee 09/20/2017   Chronic pain disorder 03/21/2017   Spondylosis of lumbar spine 03/21/2017   Thoracic spondylosis without myelopathy 03/21/2017   Type 2 diabetes mellitus with other specified complication ([place]) 32/99/2426   Anxiety as acute reaction to exceptional stress 01/13/2015   Attention deficit hyperactivity disorder (ADHD), 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   Gout 01/20/2014   GERD (gastroesophageal reflux disease) 01/20/2014   Essential hypertension, benign 01/20/2014   Plantar fasciitis of left foot 09/19/2011    Past Surgical History:  Procedure Laterality Date   BREAST CYST EXCISION Left    2009 no visible scar on skin   CESAREAN SECTION     COLONOSCOPY WITH PROPOFOL N/A 12/25/2019   Procedure: COLONOSCOPY WITH PROPOFOL;  Surgeon: Rogene Houston, MD;  Location: [facility];  Service: Endoscopy;  Laterality: N/A;  730   EXTRACORPOREAL SHOCK WAVE LITHOTRIPSY Left 10/10/2017   Procedure: LEFT EXTRACORPOREAL SHOCK WAVE LITHOTRIPSY (ESWL);  Surgeon: Bjorn Loser, MD;  Location: [facility];  Service: Urology;  Laterality: Left;   EYE SURGERY     HEMORRHOID SURGERY N/A 11/19/2012   Procedure: HEMORRHOIDECTOMY;  Surgeon: Jamesetta So, MD;  Location: [facility] ORS;  Service: General;  Laterality: N/A;  kidney stones  1998   LAPAROSCOPIC UNILATERAL SALPINGO OOPHERECTOMY  05/14/2012   Procedure: LAPAROSCOPIC UNILATERAL SALPINGO OOPHORECTOMY;  Surgeon: [name], MD;  Location: [facility] OR[facility];  Service: Gynecology;  Laterality: Right;  laparoscopic right salpingo-oophorectomy   PARTIAL HYSTERECTOMY     POLYPECTOMY  12/25/2019   Procedure: POLYPECTOMY;  Surgeon: Rogene Houston, MD;  Location: [facility];  Service: Endoscopy;;   TONSILECTOMY, ADENOIDECTOMY,  BILATERAL MYRINGOTOMY AND TUBES     VESICOVAGINAL FISTULA CLOSURE W/ TAH       OB History     Gravida  2   Para  1   Term      Preterm  1   AB  1   Living  1      SAB  1   IAB      Ectopic      Multiple      Live Births  1           Family History  Problem Relation Age of Onset   Arthritis Other    Cancer Other    Diabetes Other    Hypertension Mother    Dementia Mother    Diabetes Father    ALS Father    Diabetes Brother    Hypertension Brother    Cancer Paternal Aunt    COPD Maternal Grandmother    Cancer Maternal Grandfather    Anesthesia problems Paternal Grandfather     Social History   Tobacco Use   Smoking status: Former    Types: Cigarettes    Quit date: 02/27/1999    Years since quitting: 22.1   Smokeless tobacco: Never   Tobacco comments:    socially   Vaping Use   Vaping Use: Never used  Substance Use Topics   Alcohol use: No    Alcohol/week: 0.0 standard drinks   Drug use: No    Home Medications Prior to Admission medications   Medication Sig Start Date End Date Taking? Authorizing Provider  diclofenac Sodium (VOLTAREN) 1 % GEL Apply 2 g topically 4 (four) times daily. 05/01/21  Yes Jumanah Hynson, PA-C  dicyclomine (BENTYL) 20 MG tablet Take 1 tablet (20 mg total) by mouth 2 (two) times daily as needed for spasms. 05/01/21  Yes Addilyn Satterwhite, PA-C  acyclovir (ZOVIRAX) 400 MG tablet Take 1 tablet (400 mg total) by mouth 2 (two) times daily. 12/28/20   Derek Jack, MD  albuterol (VENTOLIN HFA) 108 (90 Base) MCG/ACT inhaler Inhale 2 puffs into the lungs every 4 (four) hours as needed for wheezing or shortness of breath. 11/30/20   Lovena Le, Malena M, DO  allopurinol (ZYLOPRIM) 300 MG tablet TAKE (1) TABLET BY MOUTH ONCE DAILY. 01/27/21   Lovena Le, Malena M, DO  amLODipine (NORVASC) 2.5 MG tablet Take 2.5 mg by mouth daily. 01/27/21   [provider]  aspirin EC 81 MG tablet Take 1 tablet (81 mg total) by mouth  daily. 12/26/19   Rehman, Mechele Dawley, MD  bumetanide (BUMEX) 0.5 MG tablet Take 0.5 mg by mouth daily.  06/30/19   [provider]  carvedilol (COREG) 25 MG tablet Take 1 tablet (25 mg total) by mouth 2 (two) times daily. 12/29/19   Thurnell Lose, MD  diazepam (VALIUM) 5 MG tablet Take 1 tablet (5 mg total) by mouth at bedtime. 11/01/20   Lovena Le, Malena M, DO  Docosanol 10 % CREA Apply 1 application topically daily as needed (for nose).     [provider]  docusate sodium (COLACE) 100 MG capsule Take 100 mg by mouth daily.     [provider]  erythromycin ophthalmic ointment Place 1 application into the left eye at bedtime. 08/30/20   Chalmers Guest, NP  escitalopram (LEXAPRO) 10 MG tablet TAKE (1) TABLET BY MOUTH ONCE DAILY. MAY START WITH 1/2 TABLET FOR 7 DAYS. 04/28/21   Lindell Spar, MD  EUFLEXXA 20 MG/2ML SOSY SMARTSIG:1 Syringe(s) I-ARTIC Once a Week 12/06/20   [provider]  fluticasone (FLONASE) 50 MCG/ACT nasal spray INSTILL 2 SPRAYS INTO BOTH NOSTRILS DAILY 01/27/21   Lovena Le, Malena M, DO  losartan (COZAAR) 100 MG tablet Take 100 mg by mouth daily.    [provider]  magnesium oxide (MAG-OX) 400 (241.3 Mg) MG tablet Take 1 tablet (400 mg total) by mouth 3 (three) times daily. 10/02/18   Derek Jack, MD  metFORMIN (GLUCOPHAGE) 500 MG tablet TAKE 1 TABLET BY MOUTH ONCE DAILY WITH BREAKFAST Patient taking differently: Take 1,000 mg by mouth 2 (two) times daily with a meal. 07/21/18   Mikey Kirschner, MD  nitrofurantoin, macrocrystal-monohydrate, (MACROBID) 100 MG capsule Take 1 capsule (100 mg total) by mouth at bedtime. 11/30/20   Derek Jack, MD  ondansetron (ZOFRAN) 8 MG tablet TAKE 1 TABLET BY MOUTH EVERY 8 HOURS AS NEEDED. 08/03/20   Derek Jack, MD  pantoprazole (PROTONIX) 40 MG tablet TAKE (1) TABLET BY MOUTH ONCE DAILY. 01/27/21   Lovena Le, Malena M, DO  pregabalin (LYRICA) 200 MG capsule TAKE (1) CAPSULE BY MOUTH  TWICE DAILY. 01/24/21   Derek Jack, MD  prochlorperazine (COMPAZINE) 10 MG tablet TAKE 1 TABLET BY MOUTH EVERY 6 HOURS AS NEEDED FOR NAUSEA OR VOMITING. 08/03/20   Derek Jack, MD  Propylene Glycol (SYSTANE BALANCE) 0.6 % SOLN Apply 1 drop to eye daily as needed (dry eye).     [provider]  rosuvastatin (CRESTOR) 5 MG tablet Take 5 mg by mouth daily. 10/17/20   [provider]  TRULICITY 1.5 AQ/7.6AU SOPN Inject 0.75 mg into the skin every Monday.  05/14/18   [provider]  valACYclovir (VALTREX) 1000 MG tablet TAKE 2 TABLETS BY MOUTH NOW; THEN 2 12 HOURS LATER. 07/26/20   Derek Jack, MD  VELCADE 3.5 MG injection Inject 3.5 mg into the vein every 14 (fourteen) days. 01/17/21   Derek Jack, MD    Allergies    Patient has no known allergies.  Review of Systems   Review of Systems  Gastrointestinal:  Positive for abdominal pain and nausea.  Allergic/Immunologic: Positive for immunocompromised state.  All other systems reviewed and are negative.  Physical Exam Updated Vital Signs BP (!) 139/92 (BP Location: Right Arm)   Pulse 71   Temp 97.9 F (36.6 C) (Oral)   Resp 18   SpO2 99%   Physical Exam Vitals and nursing note reviewed.  Constitutional:      General: She is not in acute distress.    Appearance: Normal appearance. She is obese.  HENT:     Head: Normocephalic and atraumatic.  Eyes:     Conjunctiva/sclera: Conjunctivae normal.     Pupils: Pupils are equal, round, and reactive to light.  Cardiovascular:     Rate and Rhythm: Normal rate and regular rhythm.     Pulses: Normal pulses.  Pulmonary:     Effort: Pulmonary effort is normal. No respiratory distress.     Breath sounds: Normal breath sounds. No wheezing.     Comments: Speaking in full  sentences.  Clear lung sounds in all fields. Abdominal:     General: There is no distension.     Palpations: Abdomen is soft. There is no mass.     Tenderness: There is  abdominal tenderness. There is no guarding or rebound.     Comments: Ild ttp of LUQ abd and L lateral side.  No rigidity, guarding, distention.  Negative rebound.  No peritonitis.  No CVA tenderness  Musculoskeletal:        General: Normal range of motion.     Cervical back: Normal range of motion and neck supple.  Skin:    General: Skin is warm and dry.     Capillary Refill: Capillary refill takes less than 2 seconds.  Neurological:     Mental Status: She is alert and oriented to person, place, and time.  Psychiatric:        Mood and Affect: Mood and affect normal.        Speech: Speech normal.        Behavior: Behavior normal.    ED Results / Procedures / Treatments   Labs (all labs ordered are listed, but only abnormal results are displayed) Labs Reviewed  COMPREHENSIVE METABOLIC PANEL - Abnormal; Notable for the following components:      Result Value   Total Protein 6.0 (*)    All other components within normal limits  URINALYSIS, ROUTINE W REFLEX MICROSCOPIC - Abnormal; Notable for the following components:   Color, Urine STRAW (*)    All other components within normal limits  LIPASE, BLOOD  CBC    EKG None  Radiology CT Renal Stone Study  Result Date: 05/01/2021 CLINICAL DATA:  Left lower quadrant abdominal pain. Status post chemo stem cell transplant for multiple myeloma. EXAM: CT ABDOMEN AND PELVIS WITHOUT CONTRAST TECHNIQUE: Multidetector CT imaging of the abdomen and pelvis was performed following the standard protocol without IV contrast. COMPARISON:  CT abdomen and pelvis 10/25/2017. FINDINGS: Lower chest: There is linear scarring or atelectasis in both lung bases. Hepatobiliary: No focal liver abnormality is seen. No gallstones, gallbladder wall thickening, or biliary dilatation. Pancreas: Unremarkable. No pancreatic ductal dilatation or surrounding inflammatory changes. Spleen: Normal in size without focal abnormality. Adrenals/Urinary Tract: There are punctate  nonobstructing right renal calculi measuring up to 5 mm. No left renal calculi are seen. There is no hydronephrosis. No ureteral or bladder calculi are visualized. The adrenal glands are within normal limits bilaterally. The bladder is within normal limits. Stomach/Bowel: Stomach is within normal limits. Appendix appears normal. No evidence of bowel wall thickening, distention, or inflammatory changes. Vascular/Lymphatic: Aortic atherosclerosis. No enlarged abdominal or pelvic lymph nodes. Reproductive: Status post hysterectomy. No adnexal masses. Other: No abdominal wall hernia or abnormality. No abdominopelvic ascites. Musculoskeletal: Lucent lesions with some mixed areas of sclerosis seen throughout the osseous structures compatible with patient's history of multiple myeloma. No acute fractures are seen. There is stable mild compression deformity of T12. IMPRESSION: 1. Nonobstructing right renal calculi. 2. Diffuse osseous lesions compatible with patient's history of multiple myeloma. Electronically Signed   By: Ronney Asters M.D.   On: 05/01/2021 17:38    Procedures Procedures   Medications Ordered in ED Medications  morphine 4 MG/ML injection 4 mg (4 mg Intravenous Given 05/01/21 1711)  ondansetron (ZOFRAN) injection 4 mg (4 mg Intravenous Given 05/01/21 1710)    ED Course  I have reviewed the triage vital signs and the nursing notes.  Pertinent labs & imaging results that were available  during my care of the patient were reviewed by me and considered in my medical decision making (see chart for details).   MDM Rules/Calculators/A&P                           Patient presenting for evaluation of left-sided abdominal pain.  On exam, patient appears nontoxic.  She does have some mild tenderness palpation left side of the abdomen.  She has a significant medical history including being immunocompromise due to being on chemo for multiple myeloma.  Also a history of kidney stones and frequent  UTIs.  Labs obtained from triage interpreted by me, overall reassuring.  No acidosis.  Kidney function normal.  Urine is still pending.  We will add on CT and treat symptomatically and reassess.  CT negative for acute findings.  Urine without signs of infection.  On reevaluation, patient reports significant improvement of symptoms with pain control.  I discussed that at this time, there does not appear to be an acute or life-threatening event requiring hospitalization or emergent surgery.  Discussed continued pain management/symptomatic management at home and follow-up with PCP.  At this time, patient appears safe for discharge.  Return precautions given.  Patient and husband state they understand and agree to plan  Final Clinical Impression(s) / ED Diagnoses Final diagnoses:  Left sided abdominal pain    Rx / DC Orders ED Discharge Orders          Ordered    diclofenac Sodium (VOLTAREN) 1 % GEL  4 times daily        05/01/21 1801    dicyclomine (BENTYL) 20 MG tablet  2 times daily PRN        05/01/21 1801             Franchot Heidelberg, PA-C 05/01/21 1903    Tegeler, Gwenyth Allegra, MD 05/02/21 0021

## 2021-05-01 NOTE — Discharge Instructions (Signed)
Continue taking home medications as prescribed. Use Tylenol as needed for pain. You may try Voltaren gel to help with pain. You may try Bentyl to help with abdominal or intestinal spasm. Follow-up with your primary care doctor for recheck of your symptoms. Return to the emergency room if develop high fevers, persistent vomiting, severe worsening pain, inability urinate, or any new, worsening, or concerning symptoms

## 2021-05-03 ENCOUNTER — Other Ambulatory Visit: Payer: Self-pay

## 2021-05-03 ENCOUNTER — Inpatient Hospital Stay (HOSPITAL_COMMUNITY): Payer: BC Managed Care – PPO

## 2021-05-03 VITALS — BP 144/95 | HR 72 | Temp 97.5°F | Resp 20

## 2021-05-03 DIAGNOSIS — C9000 Multiple myeloma not having achieved remission: Secondary | ICD-10-CM

## 2021-05-03 DIAGNOSIS — Z5112 Encounter for antineoplastic immunotherapy: Secondary | ICD-10-CM | POA: Diagnosis not present

## 2021-05-03 LAB — MAGNESIUM: Magnesium: 1.9 mg/dL (ref 1.7–2.4)

## 2021-05-03 LAB — COMPREHENSIVE METABOLIC PANEL
ALT: 14 U/L (ref 0–44)
AST: 19 U/L (ref 15–41)
Albumin: 3.9 g/dL (ref 3.5–5.0)
Alkaline Phosphatase: 64 U/L (ref 38–126)
Anion gap: 9 (ref 5–15)
BUN: 17 mg/dL (ref 6–20)
CO2: 23 mmol/L (ref 22–32)
Calcium: 9.3 mg/dL (ref 8.9–10.3)
Chloride: 105 mmol/L (ref 98–111)
Creatinine, Ser: 1.07 mg/dL — ABNORMAL HIGH (ref 0.44–1.00)
GFR, Estimated: >60 mL/min (ref >60)
Glucose, Bld: 127 mg/dL — ABNORMAL HIGH (ref 70–99)
Potassium: 3.7 mmol/L (ref 3.5–5.1)
Sodium: 137 mmol/L (ref 135–145)
Total Bilirubin: 0.5 mg/dL (ref 0.3–1.2)
Total Protein: 6.8 g/dL (ref 6.5–8.1)

## 2021-05-03 LAB — CBC WITH DIFFERENTIAL/PLATELET
Abs Immature Granulocytes: 0.02 10*3/uL (ref 0.00–0.07)
Basophils Absolute: 0 10*3/uL (ref 0.0–0.1)
Basophils Relative: 1 %
Eosinophils Absolute: 0 10*3/uL (ref 0.0–0.5)
Eosinophils Relative: 1 %
HCT: 37.7 % (ref 36.0–46.0)
Hemoglobin: 12.5 g/dL (ref 12.0–15.0)
Immature Granulocytes: 0 %
Lymphocytes Relative: 25 %
Lymphs Abs: 1.4 10*3/uL (ref 0.7–4.0)
MCH: 31.6 pg (ref 26.0–34.0)
MCHC: 33.2 g/dL (ref 30.0–36.0)
MCV: 95.4 fL (ref 80.0–100.0)
Monocytes Absolute: 0.5 10*3/uL (ref 0.1–1.0)
Monocytes Relative: 9 %
Neutro Abs: 3.6 10*3/uL (ref 1.7–7.7)
Neutrophils Relative %: 64 %
Platelets: 175 10*3/uL (ref 150–400)
RBC: 3.95 MIL/uL (ref 3.87–5.11)
RDW: 14.9 % (ref 11.5–15.5)
WBC: 5.6 10*3/uL (ref 4.0–10.5)
nRBC: 0 % (ref 0.0–0.2)

## 2021-05-03 MED ORDER — BORTEZOMIB CHEMO SQ INJECTION 3.5 MG (2.5MG/ML)
1.0400 mg/m2 | Freq: Once | INTRAMUSCULAR | Status: AC
  Administered 2021-05-03: 2.5 mg via SUBCUTANEOUS
  Filled 2021-05-03: qty 1

## 2021-05-03 MED ORDER — INFLUENZA VAC SPLIT QUAD 0.5 ML IM SUSY
0.5000 mL | PREFILLED_SYRINGE | Freq: Once | INTRAMUSCULAR | Status: AC
  Administered 2021-05-03: 0.5 mL via INTRAMUSCULAR
  Filled 2021-05-03: qty 0.5

## 2021-05-03 NOTE — Patient Instructions (Signed)
[facility]  Discharge Instructions: Thank you for choosing [facility] to provide your oncology and hematology care.  If you have a lab appointment with the [facility], please come in thru the Main Entrance and check in at the main information desk.  Wear comfortable clothing and clothing appropriate for easy access to any Portacath or PICC line.   We strive to give you quality time with your provider. You may need to reschedule your appointment if you arrive late (15 or more minutes).  Arriving late affects you and other patients whose appointments are after yours.  Also, if you miss three or more appointments without notifying the office, you may be dismissed from the clinic at the provider's discretion.      For prescription refill requests, have your pharmacy contact our office and allow 72 hours for refills to be completed.    Today you received the following chemotherapy and/or immunotherapy agents Velcade and Influenza Vaccination      To help prevent nausea and vomiting after your treatment, we encourage you to take your nausea medication as directed.  BELOW ARE SYMPTOMS THAT SHOULD BE REPORTED IMMEDIATELY: *FEVER GREATER THAN 100.4 F (38 C) OR HIGHER *CHILLS OR SWEATING *NAUSEA AND VOMITING THAT IS NOT CONTROLLED WITH YOUR NAUSEA MEDICATION *UNUSUAL SHORTNESS OF BREATH *UNUSUAL BRUISING OR BLEEDING *URINARY PROBLEMS (pain or burning when urinating, or frequent urination) *BOWEL PROBLEMS (unusual diarrhea, constipation, pain near the anus) TENDERNESS IN MOUTH AND THROAT WITH OR WITHOUT PRESENCE OF ULCERS (sore throat, sores in mouth, or a toothache) UNUSUAL RASH, SWELLING OR PAIN  UNUSUAL VAGINAL DISCHARGE OR ITCHING   Items with * indicate a potential emergency and should be followed up as soon as possible or go to the Emergency Department if any problems should occur.  Please show the CHEMOTHERAPY ALERT CARD or IMMUNOTHERAPY ALERT CARD at check-in  to the Emergency Department and triage nurse.  Should you have questions after your visit or need to cancel or reschedule your appointment, please contact [facility] [phone]  and follow the prompts.  Office hours are 8:00 a.m. to 4:30 p.m. Monday - Friday. Please note that voicemails left after 4:00 p.m. may not be returned until the following business day.  We are closed weekends and major holidays. You have access to a nurse at all times for urgent questions. Please call the main number to the clinic [phone] and follow the prompts.  For any non-urgent questions, you may also contact your provider using MyChart. We now offer e-Visits for anyone 7 and older to request care online for non-urgent symptoms. For details visit [email or web address].   Also download the MyChart app! Go to the app store, search "MyChart", open the app, select [facility], and log in with your MyChart username and password.  Due to Covid, a mask is required upon entering the hospital/clinic. If you do not have a mask, one will be given to you upon arrival. For doctor visits, patients may have 1 support person aged 98 or older with them. For treatment visits, patients cannot have anyone with them due to current Covid guidelines and our immunocompromised population.

## 2021-05-03 NOTE — Progress Notes (Signed)
Patient presents today for Velcade per providers order.  Labs and vital signs within parameters for treatment.  Patient has no new complaints at this time.  Patient receiving influenza vaccination today as well.  Stable during administration without incident; injection site WNL; see MAR for injection details.  Patient tolerated procedure well and without incident.  No questions or complaints noted at this time.  Discharge from clinic ambulatory in stable condition.  Alert and oriented X 3.  Follow up with [facility] as scheduled.

## 2021-05-08 ENCOUNTER — Encounter (HOSPITAL_COMMUNITY): Payer: Self-pay | Admitting: *Deleted

## 2021-05-08 NOTE — Progress Notes (Signed)
Contacted [facility] following a message left to schedule delivery of patient's Velcade.  Delivery is set for Monday November 7th for 2 doses in the care of Henreitta Leber, who was made aware of order.

## 2021-05-17 ENCOUNTER — Other Ambulatory Visit: Payer: Self-pay

## 2021-05-17 ENCOUNTER — Inpatient Hospital Stay (HOSPITAL_COMMUNITY): Payer: BC Managed Care – PPO | Attending: Hematology

## 2021-05-17 ENCOUNTER — Inpatient Hospital Stay (HOSPITAL_COMMUNITY): Payer: BC Managed Care – PPO

## 2021-05-17 VITALS — BP 125/86 | HR 74 | Temp 96.7°F | Resp 20 | Wt 268.2 lb

## 2021-05-17 DIAGNOSIS — C9000 Multiple myeloma not having achieved remission: Secondary | ICD-10-CM | POA: Insufficient documentation

## 2021-05-17 DIAGNOSIS — Z5112 Encounter for antineoplastic immunotherapy: Secondary | ICD-10-CM | POA: Diagnosis present

## 2021-05-17 DIAGNOSIS — Z79899 Other long term (current) drug therapy: Secondary | ICD-10-CM | POA: Insufficient documentation

## 2021-05-17 LAB — COMPREHENSIVE METABOLIC PANEL
ALT: 13 U/L (ref 0–44)
AST: 17 U/L (ref 15–41)
Albumin: 3.9 g/dL (ref 3.5–5.0)
Alkaline Phosphatase: 71 U/L (ref 38–126)
Anion gap: 10 (ref 5–15)
BUN: 25 mg/dL — ABNORMAL HIGH (ref 6–20)
CO2: 24 mmol/L (ref 22–32)
Calcium: 8.6 mg/dL — ABNORMAL LOW (ref 8.9–10.3)
Chloride: 105 mmol/L (ref 98–111)
Creatinine, Ser: 1 mg/dL (ref 0.44–1.00)
GFR, Estimated: >60 mL/min (ref >60)
Glucose, Bld: 154 mg/dL — ABNORMAL HIGH (ref 70–99)
Potassium: 3.9 mmol/L (ref 3.5–5.1)
Sodium: 139 mmol/L (ref 135–145)
Total Bilirubin: 0.5 mg/dL (ref 0.3–1.2)
Total Protein: 6.7 g/dL (ref 6.5–8.1)

## 2021-05-17 LAB — CBC WITH DIFFERENTIAL/PLATELET
Abs Immature Granulocytes: 0.01 10*3/uL (ref 0.00–0.07)
Basophils Absolute: 0 10*3/uL (ref 0.0–0.1)
Basophils Relative: 1 %
Eosinophils Absolute: 0.1 10*3/uL (ref 0.0–0.5)
Eosinophils Relative: 1 %
HCT: 36 % (ref 36.0–46.0)
Hemoglobin: 12.1 g/dL (ref 12.0–15.0)
Immature Granulocytes: 0 %
Lymphocytes Relative: 29 %
Lymphs Abs: 1.6 10*3/uL (ref 0.7–4.0)
MCH: 31.6 pg (ref 26.0–34.0)
MCHC: 33.6 g/dL (ref 30.0–36.0)
MCV: 94 fL (ref 80.0–100.0)
Monocytes Absolute: 0.6 10*3/uL (ref 0.1–1.0)
Monocytes Relative: 10 %
Neutro Abs: 3.3 10*3/uL (ref 1.7–7.7)
Neutrophils Relative %: 59 %
Platelets: 184 10*3/uL (ref 150–400)
RBC: 3.83 MIL/uL — ABNORMAL LOW (ref 3.87–5.11)
RDW: 15.2 % (ref 11.5–15.5)
WBC: 5.7 10*3/uL (ref 4.0–10.5)
nRBC: 0 % (ref 0.0–0.2)

## 2021-05-17 LAB — LACTATE DEHYDROGENASE: LDH: 130 U/L (ref 98–192)

## 2021-05-17 LAB — MAGNESIUM: Magnesium: 2.1 mg/dL (ref 1.7–2.4)

## 2021-05-17 MED ORDER — BORTEZOMIB CHEMO SQ INJECTION 3.5 MG (2.5MG/ML)
1.0400 mg/m2 | Freq: Once | INTRAMUSCULAR | Status: AC
  Administered 2021-05-17: 2.5 mg via SUBCUTANEOUS
  Filled 2021-05-17: qty 1

## 2021-05-17 NOTE — Progress Notes (Signed)
Patient presents today for Velcade injection per providers order.  Labs and vital signs within parameters for treatment.  Patient takes her premedication at home prior to injection appointment.  No new complaints at this time.  Stable during administration without incident; injection site WNL; see MAR for injection details.  Patient tolerated procedure well and without incident.  No questions or complaints noted at this time.  Discharge from clinic ambulatory in stable condition.  Alert and oriented X 3.  Follow up with [facility] as scheduled.

## 2021-05-17 NOTE — Patient Instructions (Signed)
[facility]  Discharge Instructions: Thank you for choosing [facility] to provide your oncology and hematology care.  If you have a lab appointment with the [facility], please come in thru the Main Entrance and check in at the main information desk.  Wear comfortable clothing and clothing appropriate for easy access to any Portacath or PICC line.   We strive to give you quality time with your provider. You may need to reschedule your appointment if you arrive late (15 or more minutes).  Arriving late affects you and other patients whose appointments are after yours.  Also, if you miss three or more appointments without notifying the office, you may be dismissed from the clinic at the provider's discretion.      For prescription refill requests, have your pharmacy contact our office and allow 72 hours for refills to be completed.    Today you received the following chemotherapy and/or immunotherapy agents Velcade      To help prevent nausea and vomiting after your treatment, we encourage you to take your nausea medication as directed.  BELOW ARE SYMPTOMS THAT SHOULD BE REPORTED IMMEDIATELY: *FEVER GREATER THAN 100.4 F (38 C) OR HIGHER *CHILLS OR SWEATING *NAUSEA AND VOMITING THAT IS NOT CONTROLLED WITH YOUR NAUSEA MEDICATION *UNUSUAL SHORTNESS OF BREATH *UNUSUAL BRUISING OR BLEEDING *URINARY PROBLEMS (pain or burning when urinating, or frequent urination) *BOWEL PROBLEMS (unusual diarrhea, constipation, pain near the anus) TENDERNESS IN MOUTH AND THROAT WITH OR WITHOUT PRESENCE OF ULCERS (sore throat, sores in mouth, or a toothache) UNUSUAL RASH, SWELLING OR PAIN  UNUSUAL VAGINAL DISCHARGE OR ITCHING   Items with * indicate a potential emergency and should be followed up as soon as possible or go to the Emergency Department if any problems should occur.  Please show the CHEMOTHERAPY ALERT CARD or IMMUNOTHERAPY ALERT CARD at check-in to the Emergency  Department and triage nurse.  Should you have questions after your visit or need to cancel or reschedule your appointment, please contact [facility] [phone]  and follow the prompts.  Office hours are 8:00 a.m. to 4:30 p.m. Monday - Friday. Please note that voicemails left after 4:00 p.m. may not be returned until the following business day.  We are closed weekends and major holidays. You have access to a nurse at all times for urgent questions. Please call the main number to the clinic [phone] and follow the prompts.  For any non-urgent questions, you may also contact your provider using MyChart. We now offer e-Visits for anyone 57 and older to request care online for non-urgent symptoms. For details visit [email or web address].   Also download the MyChart app! Go to the app store, search "MyChart", open the app, select [facility], and log in with your MyChart username and password.  Due to Covid, a mask is required upon entering the hospital/clinic. If you do not have a mask, one will be given to you upon arrival. For doctor visits, patients may have 1 support person aged 72 or older with them. For treatment visits, patients cannot have anyone with them due to current Covid guidelines and our immunocompromised population.

## 2021-05-18 ENCOUNTER — Encounter: Payer: Self-pay | Admitting: Adult Health

## 2021-05-18 ENCOUNTER — Ambulatory Visit (INDEPENDENT_AMBULATORY_CARE_PROVIDER_SITE_OTHER): Payer: BC Managed Care – PPO | Admitting: Adult Health

## 2021-05-18 VITALS — BP 126/86 | HR 69 | Ht 64.0 in | Wt 267.0 lb

## 2021-05-18 DIAGNOSIS — I69320 Aphasia following cerebral infarction: Secondary | ICD-10-CM

## 2021-05-18 DIAGNOSIS — I63412 Cerebral infarction due to embolism of left middle cerebral artery: Secondary | ICD-10-CM

## 2021-05-18 DIAGNOSIS — I10 Essential (primary) hypertension: Secondary | ICD-10-CM

## 2021-05-18 DIAGNOSIS — E119 Type 2 diabetes mellitus without complications: Secondary | ICD-10-CM | POA: Diagnosis not present

## 2021-05-18 DIAGNOSIS — E785 Hyperlipidemia, unspecified: Secondary | ICD-10-CM | POA: Diagnosis not present

## 2021-05-18 LAB — KAPPA/LAMBDA LIGHT CHAINS
Kappa free light chain: 30.1 mg/L — ABNORMAL HIGH (ref 3.3–19.4)
Kappa, lambda light chain ratio: 1.29 (ref 0.26–1.65)
Lambda free light chains: 23.4 mg/L (ref 5.7–26.3)

## 2021-05-18 NOTE — Progress Notes (Signed)
[facility] [address] [place]. [place] [address] [phone]       STROKE FOLLOW UP NOTE  Joanna Reid Date of Birth:  07/13/1965 Medical Record Number:  [id number]   Reason for Referral: stroke follow up    SUBJECTIVE:   CHIEF COMPLAINT:  Chief Complaint  Patient presents with   Follow-up    Rm 3 alone here for 6 month f/u. Reports she has been doing well since last visit.      HPI:   Update 05/18/2021 JM: Returns for 40-monthstroke follow-up unaccompanied  Overall stable from stroke standpoint -denies new stroke/TIA symptoms Residual mild speech difficulties - can worsen if "placed on the spot", fatigued or anxious. Has been reading more. Still has issues with numbers but learning to compensate   Compliant on aspirin and Crestor -denies side effects Blood pressure today 126/86 Recent A1c 5.4 - DM managed by endocrinology  Continue routine follow-up with oncology for multiple myeloma - Velcade every 2 weeks Experiencing worsening low back pain -oncology completed MR lumbar 03/2021 which showed T12 and L1 fractures likely in setting of multiple myeloma. Has been seen by GCassie Freer(unable to view notes via epic) - recently completed imaging of hips - ortho concerned she may have AVN - has appt next Thursday to review results.   No further concerns at this time      History provided for reference purposes only Update 11/10/2020 JM: Joanna Reid for stroke follow-up after prior visit 6 months ago accompanied by her husband  Stable from stroke standpoint without new stroke/TIA symptoms and occasional word finding difficulty typically worse with heightened emotions but otherwise doing well. Also mixes up numbers. No issues with letters   Compliant on aspirin and Crestor without associated side effects Blood pressure today 97/73 - no routinely monitored at home as it has been stable and routinely monitored at cancer  center Continues to follow closely with endocrinology for DM management  Continues to follow with oncology for multiple myeloma continuing to receive Velcade every 2 weeks as maintenance as her disease is noted to currently be in remission.  She also remains on pregabalin 200 mg twice daily for peripheral neuropathy per oncology.  No further concerns at this time   Update 05/12/2020 JM: Joanna Reid for stroke follow-up.  She has been doing well since prior visit with ongoing improvement of her speech.  She does notice worsening more so when she is excited or fatigued.  Continues to do exercises routinely at home.  Denies new or worsening stroke/TIA symptoms.  She remains on aspirin 81 mg daily without bleeding or bruising.  Remains on Crestor 10 mg daily without myalgias.  Blood pressure today 140/84.  Continues to follow with endocrinology for DM management.  She continues to follow closely with oncology for multiple myeloma continuing Velcade maintenance with Revlimid on hold since her stroke. Per patient, current myeloma counts satisfactory therefore not planning on restarting Revlimid unless indicated in the future.  She continues to follow with urology for recurrent UTIs.  No further concerns at this time.  Initial visit 01/26/2020 JM: Joanna Reid being seen for hospital follow-up accompanied by her husband.  Residual deficits of expressive aphasia and reading difficulty with ongoing improvement working with outpatient SLP.  [facility] and will be completed at the end of the week.  Apparently was discharged on aspirin 81 mg daily and clopidogrel 75 mg daily and  denies bleeding or bruising.  Continues on Crestor 10 mg daily without myalgias.  Blood pressure today 118/72.  Recent change to antihypertensive regimen by cardiology due to continued elevated blood pressures.  Routinely monitors at home.  Continues to follow with endocrinology for  diabetic management.  Chemotherapy remains on hold and questioning duration of restarting.  Plans on repeat lab work next week.  No further concerns at this time.  Stroke admission 12/25/2019 Joanna Reid is a 55 y.o. female with history of multiple myeloma (s/p stem cell transplant and currently on maintenance Velcade and Revlimid), Covid positive in March, morbid obesity, DM, HTN, migraines and smoking s/p screening colonoscopy on 12/25/2019. She developed confusion and expressive aphasia on Saturday (12/26/2019).  Eventually transferred to [facility] for further work-up.  Stroke work-up revealed embolic left MCA parietal branch stroke as well scattered punctate bilateral frontal and parietal and left cerebellar infarcts.  CTA no significant large vessel stenosis or occlusion.  Currently on chemotherapy with thrombocytopenia therefore dual platelet not recommended and recommended continuation of aspirin 81 mg.  HTN stable with long-term BP goal normotensive range.  LDL 33 and increased Crestor to 55m daily.  History of DM with A1c 5.7.  Other stroke risk factors include former tobacco use, obesity and migraines.  Other active problems include leukopenia, anemia, thrombocytopenia, hypokalemia, UTI and Covid positive 09/2019.  Advised to ensure follow-up with oncology with chemotherapy possibly placed on hold for a few months during recovery.  No further evaluation including assessment for dysrhythmias and need for anticoagulation due to thrombocytopenia recommended considering in the future with possible 30-day cardiac event monitor.  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   Resultant mild word finding difficulties/mild aphasia. Code Stroke CT Head - Normal CT appearance of the brain. ASPECTS is 10/10.    CT head - not ordered MRI head -small left parietal MCA branch infarct.  Tiny scattered punctate bilateral frontal and parietal and left cerebellar infarcts  MRA head - not ordered CTA H&N -  No  emergent large vessel occlusion. Mild narrowing of the mid basilar artery without other significant proximal stenosis, aneurysm, or branch vessel occlusion. Moderate distal small vessel disease in both the anterior and posterior circulation. Moderate tortuosity of the cervical internal carotid arteries bilaterally, likely secondary to hypertension. Atherosclerotic changes at the left carotid bifurcation and cavernous internal carotid arteries bilaterally without significant stenosis. CT Perfusion - 24 mL left parietal lobe watershed region area of ischemia without defined core infarct.  Carotid Doppler - CTA neck performed - carotid dopplers not indicated. 2D Echo - EF 60 - 65%. No cardiac source of emboli identified.  SH[employer] Hotels[employer] 2 - negative LDL - 107 HgbA1c - 5.7 UDS - benzodiazepine VTE prophylaxis - [place] Heparin aspirin 81 mg daily prior to admission, now on aspirin 81 mg daily   Patient counseled to be compliant with her antithrombotic medications Ongoing aggressive stroke risk factor management Therapy recommendations: OP SLP Disposition: home       ROS:   14 system review of systems performed and negative with exception of speech difficulty  PMH:  Past Medical History:  Diagnosis Date   Acid reflux    Allergic rhinitis    [place]    multiple myeloma   Diabetes mellitus    type 2   Gout    Gout    H/o COVID-19--- was Positive 09/17/2019, Negative 12/23/19 AND also Neg on  12/26/19 12/26/2019   HBP (high blood pressure)  History of kidney stones    Migraines     PSH:  Past Surgical History:  Procedure Laterality Date   BREAST CYST EXCISION Left    2009 no visible scar on skin   CESAREAN SECTION     COLONOSCOPY WITH PROPOFOL N/A 12/25/2019   Procedure: COLONOSCOPY WITH PROPOFOL;  Surgeon: Rogene Houston, MD;  [facility];  Service: Endoscopy;  Laterality: N/A;  730   EXTRACORPOREAL SHOCK WAVE LITHOTRIPSY Left 10/10/2017   Procedure: LEFT  EXTRACORPOREAL SHOCK WAVE LITHOTRIPSY (ESWL);  Surgeon: Bjorn Loser, MD;  Location: [facility];  Service: Urology;  Laterality: Left;   EYE SURGERY     HEMORRHOID SURGERY N/A 11/19/2012   Procedure: HEMORRHOIDECTOMY;  Surgeon: Jamesetta So, MD;  Location: [facility];  Service: General;  Laterality: N/A;   kidney stones  1998   LAPAROSCOPIC UNILATERAL SALPINGO OOPHERECTOMY  05/14/2012   Procedure: LAPAROSCOPIC UNILATERAL SALPINGO OOPHORECTOMY;  Surgeon: [name], MD;  Location: [facility];  Service: Gynecology;  Laterality: Right;  laparoscopic right salpingo-oophorectomy   PARTIAL HYSTERECTOMY     POLYPECTOMY  12/25/2019   Procedure: POLYPECTOMY;  Surgeon: Rogene Houston, MD;  Location: [facility];  Service: Endoscopy;;   TONSILECTOMY, ADENOIDECTOMY, BILATERAL MYRINGOTOMY AND TUBES     VESICOVAGINAL FISTULA CLOSURE W/ TAH      Social History:  Social History   Socioeconomic History   Marital status: Married    Spouse name: Not on file   Number of children: Not on file   Years of education: 12   Highest education level: Not on file  Occupational History    Employer: [employer]  Tobacco Use   Smoking status: Former    Types: Cigarettes    Quit date: 02/27/1999    Years since quitting: 22.2   Smokeless tobacco: Never   Tobacco comments:    socially   Vaping Use   Vaping Use: Never used  Substance and Sexual Activity   Alcohol use: No    Alcohol/week: 0.0 standard drinks   Drug use: No   Sexual activity: Yes    Birth control/protection: Surgical    Comment: hyst  Other Topics Concern   Not on file  Social History Narrative   Not on file   Social Determinants of Health   Financial Resource Strain: Not on file  Food Insecurity: Not on file  Transportation Needs: Not on file  Physical Activity: Not on file  Stress: Not on file  Social Connections: Not on file  Intimate Partner Violence: Not on file    Family History:  Family History  Problem Relation Age of Onset    Arthritis Other    Cancer Other    Diabetes Other    Hypertension Mother    Dementia Mother    Diabetes Father    ALS Father    Diabetes Brother    Hypertension Brother    Cancer Paternal Aunt    COPD Maternal Grandmother    Cancer Maternal Grandfather    Anesthesia problems Paternal Grandfather     Medications:   Current Outpatient Medications on File Prior to Visit  Medication Sig Dispense Refill   acyclovir (ZOVIRAX) 400 MG tablet Take 1 tablet (400 mg total) by mouth 2 (two) times daily. 60 tablet 3   albuterol (VENTOLIN HFA) 108 (90 Base) MCG/ACT inhaler Inhale 2 puffs into the lungs every 4 (four) hours as needed for wheezing or shortness of breath. 1 each 3   allopurinol (ZYLOPRIM) 300  MG tablet TAKE (1) TABLET BY MOUTH ONCE DAILY. 90 tablet 1   amLODipine (NORVASC) 2.5 MG tablet Take 2.5 mg by mouth daily.     aspirin EC 81 MG tablet Take 1 tablet (81 mg total) by mouth daily. 30 tablet 11   bumetanide (BUMEX) 0.5 MG tablet Take 0.5 mg by mouth daily.      carvedilol (COREG) 25 MG tablet Take 1 tablet (25 mg total) by mouth 2 (two) times daily.     diazepam (VALIUM) 5 MG tablet Take 1 tablet (5 mg total) by mouth at bedtime. 30 tablet 2   diclofenac Sodium (VOLTAREN) 1 % GEL Apply 2 g topically 4 (four) times daily. 100 g 0   dicyclomine (BENTYL) 20 MG tablet Take 1 tablet (20 mg total) by mouth 2 (two) times daily as needed for spasms. 20 tablet 0   Docosanol 10 % CREA Apply 1 application topically daily as needed (for nose).      docusate sodium (COLACE) 100 MG capsule Take 100 mg by mouth daily.      erythromycin ophthalmic ointment Place 1 application into the left eye at bedtime. 3.5 g 0   escitalopram (LEXAPRO) 10 MG tablet TAKE (1) TABLET BY MOUTH ONCE DAILY. MAY START WITH 1/2 TABLET FOR 7 DAYS. 90 tablet 0   EUFLEXXA 20 MG/2ML SOSY SMARTSIG:1 Syringe(s) I-ARTIC Once a Week     fluticasone (FLONASE) 50 MCG/ACT nasal spray INSTILL 2 SPRAYS INTO BOTH NOSTRILS DAILY 16 g  1   losartan (COZAAR) 100 MG tablet Take 100 mg by mouth daily.     magnesium oxide (MAG-OX) 400 (241.3 Mg) MG tablet Take 1 tablet (400 mg total) by mouth 3 (three) times daily. 90 tablet 3   metFORMIN (GLUCOPHAGE) 500 MG tablet TAKE 1 TABLET BY MOUTH ONCE DAILY WITH BREAKFAST (Patient taking differently: Take 1,000 mg by mouth 2 (two) times daily with a meal.) 90 tablet 0   nitrofurantoin, macrocrystal-monohydrate, (MACROBID) 100 MG capsule Take 1 capsule (100 mg total) by mouth at bedtime. 90 capsule 3   ondansetron (ZOFRAN) 8 MG tablet TAKE 1 TABLET BY MOUTH EVERY 8 HOURS AS NEEDED. 30 tablet 6   pantoprazole (PROTONIX) 40 MG tablet TAKE (1) TABLET BY MOUTH ONCE DAILY. 90 tablet 1   pregabalin (LYRICA) 200 MG capsule TAKE (1) CAPSULE BY MOUTH TWICE DAILY. 60 capsule 6   prochlorperazine (COMPAZINE) 10 MG tablet TAKE 1 TABLET BY MOUTH EVERY 6 HOURS AS NEEDED FOR NAUSEA OR VOMITING. 30 tablet 6   Propylene Glycol (SYSTANE BALANCE) 0.6 % SOLN Apply 1 drop to eye daily as needed (dry eye).      rosuvastatin (CRESTOR) 5 MG tablet Take 5 mg by mouth daily.     TRULICITY 1.5 ZT/2.4PY SOPN Inject 0.75 mg into the skin every Monday.      valACYclovir (VALTREX) 1000 MG tablet TAKE 2 TABLETS BY MOUTH NOW; THEN 2 12 HOURS LATER. 12 tablet 6   VELCADE 3.5 MG injection Inject 3.5 mg into the vein every 14 (fourteen) days. 2 each 0   No current facility-administered medications on file prior to visit.    Allergies:  No Known Allergies    OBJECTIVE:  Physical Exam  Vitals:   05/18/21 1051  BP: 126/86  Pulse: 69  SpO2: 97%  Weight: 267 lb (121.1 kg)  Height: 5' 4"  (1.626 m)   Body mass index is 45.83 kg/m. No results found.  General: well developed, well nourished, very pleasant middle-age Caucasian female,  seated, in no evident distress Head: head normocephalic and atraumatic.   Neck: supple with no carotid or supraclavicular bruits Cardiovascular: regular rate and rhythm, no  murmurs Musculoskeletal: no deformity Skin:  no rash/petichiae Vascular:  Normal pulses all extremities   Neurologic Exam Mental Status: Awake and fully alert.  Mild expressive aphasia with occasional speech hesitancy.  Oriented to place and time. Recent and remote memory intact. Attention span, concentration and fund of knowledge appropriate. Mood and affect appropriate.  Cranial Nerves: Pupils equal, briskly reactive to light. Extraocular movements full without nystagmus. Visual fields full to confrontation. Hearing intact. Facial sensation intact. Face, tongue, palate moves normally and symmetrically.  Motor: Normal bulk and tone. Normal strength in all tested extremity muscles. Sensory.: intact to touch , pinprick , position and vibratory sensation.  Coordination: Rapid alternating movements normal in all extremities. Finger-to-nose and heel-to-shin performed accurately bilaterally. Gait and Station: Arises from chair without difficulty. Stance is normal. Gait demonstrates normal stride length and balance without use of assistive device Reflexes: 1+ and symmetric. Toes downgoing.       ASSESSMENT/PLAN: Joanna Reid is a 55 y.o. year old female presented with confusion and expressive aphasia on 12/28/2019 after screening colonoscopy and holding of aspirin with stroke work-up revealing embolic left MCA parietal branch infarct secondary to unknown source.  Currently receiving chemotherapy for multiple myeloma and had thrombocytopenia during admission - Revlimid on hold since that time.  Vascular risk factors include multiple myeloma on chemotherapy, thrombocytopenia, HTN, HLD, DM and migraines.      Left MCA stroke:  Residual deficits: Mild expressive aphasia and possible alexia -referral placed to SLP Continue aspirin 81 mg daily  and Crestor 10 mg daily for secondary stroke prevention.  30-day cardiac event monitor negative for atrial fibrillation Discussed secondary stroke  prevention measures and importance of close PCP follow-up for aggressive stroke risk factor management HTN: BP goal<130/90.  Controlled although on low side today on losartan carvedilol and amlodipine per PCP -advised to routinely monitor at home as well as ensuring adequate fluid intake HLD: LDL<70.  Prior LDL 57 (02/2021) on Crestor 10 mg daily per PCP.   DMII: A1c<7.  Prior A1c 5.4 (10/1636) on Trulicity and Metformin per endocrinology Multiple myeloma: Routinely followed by oncology receiving Velcade infusions every 2 weeks. Recent dx of T12-L1 fracture - following with ortho with concerns of AVN (per pt report) - awaiting results of recent hip imaging.     Overall stable from stroke standpoint -recommend consolidating care with PCP for continued aggressive stroke risk factor management and follow-up on an as-needed basis     CC:  Lindell Spar, MD    I spent 32 minutes of face-to-face and non-face-to-face time with patient.  This included previsit chart review, lab review, study review, electronic health record documentation, patient education and discussion regarding prior stroke and residual deficits, secondary stroke prevention measures and aggressive stroke risk factor management, multiple myeloma and recent issues as above and answered all other questions to patient satisfaction   Frann Rider, AGNP-BC  [facility] [address] [place], [place] [address]  Phone [phone] Fax [phone] Note: This document was prepared with digital dictation and possible smart phrase technology. Any transcriptional errors that result from this process are unintentional.

## 2021-05-18 NOTE — Patient Instructions (Signed)
Continue aspirin 81 mg daily  and Crestor 5mg  daily  for secondary stroke prevention  Continue to follow up with PCP regarding cholesterol, blood pressure and diabetes management  Maintain strict control of hypertension with blood pressure goal below 130/90, diabetes with hemoglobin A1c goal below 7.0% and cholesterol with LDL cholesterol (bad cholesterol) goal below 70 mg/dL.        Thank you for coming to see [name] at [facility]. I hope we have been able to provide you high quality care today.  You may receive a patient satisfaction survey over the next few weeks. We would appreciate your feedback and comments so that we may continue to improve ourselves and the health of our patients.

## 2021-05-19 LAB — IMMUNOFIXATION ELECTROPHORESIS
IgA: 88 mg/dL (ref 87–352)
IgG (Immunoglobin G), Serum: 691 mg/dL (ref 586–1602)
IgM (Immunoglobulin M), Srm: 30 mg/dL (ref 26–217)
Total Protein ELP: 6.2 g/dL (ref 6.0–8.5)

## 2021-05-21 LAB — PROTEIN ELECTROPHORESIS, SERUM
A/G Ratio: 1.5 (ref 0.7–1.7)
Albumin ELP: 3.7 g/dL (ref 2.9–4.4)
Alpha-1-Globulin: 0.2 g/dL (ref 0.0–0.4)
Alpha-2-Globulin: 0.8 g/dL (ref 0.4–1.0)
Beta Globulin: 1 g/dL (ref 0.7–1.3)
Gamma Globulin: 0.6 g/dL (ref 0.4–1.8)
Globulin, Total: 2.5 g/dL (ref 2.2–3.9)
Total Protein ELP: 6.2 g/dL (ref 6.0–8.5)

## 2021-05-24 ENCOUNTER — Encounter (HOSPITAL_COMMUNITY): Payer: Self-pay | Admitting: *Deleted

## 2021-05-24 ENCOUNTER — Telehealth (HOSPITAL_COMMUNITY): Payer: Self-pay | Admitting: *Deleted

## 2021-05-24 NOTE — Telephone Encounter (Signed)
Patient is concerned about last Calcium level of 8.6 on 05/17/21.  She is not currently taking a supplement and is asking if she should take calcium and what dose is suggested.

## 2021-05-24 NOTE — Telephone Encounter (Signed)
Mild hypocalcemia - nothing to worry about.  No need for treatment at this time, but nothing wrong with a multivitamin or low-dose calcium supplement.

## 2021-05-24 NOTE — Telephone Encounter (Signed)
Thank you!  Patient has been made aware.

## 2021-05-25 ENCOUNTER — Encounter (HOSPITAL_COMMUNITY): Payer: Self-pay | Admitting: Hematology

## 2021-05-26 ENCOUNTER — Other Ambulatory Visit: Payer: Self-pay | Admitting: Family Medicine

## 2021-05-26 DIAGNOSIS — F411 Generalized anxiety disorder: Secondary | ICD-10-CM

## 2021-05-30 ENCOUNTER — Other Ambulatory Visit (HOSPITAL_COMMUNITY): Payer: Self-pay | Admitting: Physician Assistant

## 2021-05-30 DIAGNOSIS — C9000 Multiple myeloma not having achieved remission: Secondary | ICD-10-CM

## 2021-06-06 NOTE — Progress Notes (Signed)
[place], [place] [address]   CLINIC:  Medical Oncology/Hematology  PCP:  Lindell Spar, MD [address] / [place] [address] [phone]   REASON FOR VISIT:  Follow-up for multiple myeloma  PRIOR THERAPY:  1. RVD x 4 cycles from 11/01/2017 through 01/14/2018. 2. Stem cell transplant on 02/20/2018. 3. Velcade from 06/11/2018 to 12/22/2019.  NGS Results: not done  CURRENT THERAPY: Velcade every 2 weeks  BRIEF ONCOLOGIC HISTORY:  Oncology History  Multiple myeloma ([place])  10/29/2017 Initial Diagnosis   Multiple myeloma ([place])   11/01/2017 - 01/24/2018 Chemotherapy   The patient had dexamethasone (DECADRON) 4 MG tablet, 1 of 1 cycle, Start date: --, End date: -- lenalidomide (REVLIMID) 25 MG capsule, 1 of 1 cycle, Start date: --, End date: -- bortezomib SQ (VELCADE) chemo injection 3 mg, 1.3 mg/m2 = 3 mg, Subcutaneous,  Once, 5 of 5 cycles Administration: 3 mg (11/01/2017), 3 mg (11/08/2017), 3 mg (11/05/2017), 3 mg (11/22/2017), 3 mg (11/12/2017), 3 mg (11/29/2017), 3 mg (11/26/2017), 3 mg (12/03/2017), 3 mg (12/13/2017), 3 mg (12/20/2017), 3 mg (12/17/2017), 3 mg (12/24/2017), 3 mg (01/03/2018), 3 mg (01/10/2018), 3 mg (01/07/2018), 3 mg (01/14/2018), 3 mg (01/24/2018)   for chemotherapy treatment.     06/11/2018 -  Chemotherapy   Patient is on Treatment Plan : MYELOMA MAINTENANCE Bortezomib SQ q 7d x 6 weeks, two weeks off then q 14d       CANCER STAGING: Cancer Staging  Multiple myeloma ([place]) Staging form: Plasma Cell Myeloma and Plasma Cell Disorders, AJCC 8th Edition - Clinical: No stage assigned - Unsigned - Clinical: No stage assigned - Unsigned   INTERVAL HISTORY:  Joanna Reid, a 55 y.o. female, returns for routine follow-up of her multiple myeloma. Joanna Reid was last seen on 03/08/2021.   Today she reports feeling good. She continues to have back pain. She reports neuropathy in her fingers and feet; she is anxious she will fall  while walking outdoors due to the numbness in her feet. She also reports diarrhea. She reports improved concentration and speech.   REVIEW OF SYSTEMS:  Review of Systems  Constitutional:  Negative for appetite change and fatigue.  Gastrointestinal:  Positive for diarrhea.  Musculoskeletal:  Positive for arthralgias (7/10), back pain and gait problem.  Neurological:  Positive for gait problem and numbness (fingers and feet).  Psychiatric/Behavioral:  Negative for confusion and decreased concentration.   All other systems reviewed and are negative.  PAST MEDICAL/SURGICAL HISTORY:  Past Medical History:  Diagnosis Date   Acid reflux    Allergic rhinitis    Cancer ([place])    multiple myeloma   Diabetes mellitus    type 2   Gout    Gout    H/o COVID-19--- was Positive 09/17/2019, Negative 12/23/19 AND also Neg on  12/26/19 12/26/2019   HBP (high blood pressure)    History of kidney stones    Migraines    Past Surgical History:  Procedure Laterality Date   BREAST CYST EXCISION Left    2009 no visible scar on skin   CESAREAN SECTION     COLONOSCOPY WITH PROPOFOL N/A 12/25/2019   Procedure: COLONOSCOPY WITH PROPOFOL;  Surgeon: Rogene Houston, MD;  [facility];  Service: Endoscopy;  Laterality: N/A;  730   EXTRACORPOREAL SHOCK WAVE LITHOTRIPSY Left 10/10/2017   Procedure: LEFT EXTRACORPOREAL SHOCK WAVE LITHOTRIPSY (ESWL);  Surgeon: Bjorn Loser, MD;  Location: [facility];  Service: Urology;  Laterality: Left;   EYE SURGERY     HEMORRHOID SURGERY N/A 11/19/2012   Procedure: HEMORRHOIDECTOMY;  Surgeon: Jamesetta So, MD;  Location: [facility];  Service: General;  Laterality: N/A;   kidney stones  1998   LAPAROSCOPIC UNILATERAL SALPINGO OOPHERECTOMY  05/14/2012   Procedure: LAPAROSCOPIC UNILATERAL SALPINGO OOPHORECTOMY;  Surgeon: [name], MD;  Location: [facility];  Service: Gynecology;  Laterality: Right;  laparoscopic right salpingo-oophorectomy   PARTIAL HYSTERECTOMY      POLYPECTOMY  12/25/2019   Procedure: POLYPECTOMY;  Surgeon: Rogene Houston, MD;  [facility];  Service: Endoscopy;;   TONSILECTOMY, ADENOIDECTOMY, BILATERAL MYRINGOTOMY AND TUBES     VESICOVAGINAL FISTULA CLOSURE W/ TAH      SOCIAL HISTORY:  Social History   Socioeconomic History   Marital status: Married    Spouse name: Not on file   Number of children: Not on file   Years of education: 12   Highest education level: Not on file  Occupational History    Employer: [employer]  Tobacco Use   Smoking status: Former    Types: Cigarettes    Quit date: 02/27/1999    Years since quitting: 22.2   Smokeless tobacco: Never   Tobacco comments:    socially   Vaping Use   Vaping Use: Never used  Substance and Sexual Activity   Alcohol use: No    Alcohol/week: 0.0 standard drinks   Drug use: No   Sexual activity: Yes    Birth control/protection: Surgical    Comment: hyst  Other Topics Concern   Not on file  Social History Narrative   Not on file   Social Determinants of Health   Financial Resource Strain: Not on file  Food Insecurity: Not on file  Transportation Needs: Not on file  Physical Activity: Not on file  Stress: Not on file  Social Connections: Not on file  Intimate Partner Violence: Not on file    FAMILY HISTORY:  Family History  Problem Relation Age of Onset   Arthritis Other    Cancer Other    Diabetes Other    Hypertension Mother    Dementia Mother    Diabetes Father    ALS Father    Diabetes Brother    Hypertension Brother    Cancer Paternal Aunt    COPD Maternal Grandmother    Cancer Maternal Grandfather    Anesthesia problems Paternal Grandfather     CURRENT MEDICATIONS:  Current Outpatient Medications  Medication Sig Dispense Refill   acyclovir (ZOVIRAX) 400 MG tablet TAKE (1) TABLET BY MOUTH TWICE DAILY. 60 tablet 0   albuterol (VENTOLIN HFA) 108 (90 Base) MCG/ACT inhaler Inhale 2 puffs into the lungs every 4 (four) hours as needed for  wheezing or shortness of breath. 1 each 3   allopurinol (ZYLOPRIM) 300 MG tablet TAKE (1) TABLET BY MOUTH ONCE DAILY. 90 tablet 1   amLODipine (NORVASC) 2.5 MG tablet Take 2.5 mg by mouth daily.     aspirin EC 81 MG tablet Take 1 tablet (81 mg total) by mouth daily. 30 tablet 11   bumetanide (BUMEX) 0.5 MG tablet Take 0.5 mg by mouth daily.      carvedilol (COREG) 25 MG tablet Take 1 tablet (25 mg total) by mouth 2 (two) times daily.     diazepam (VALIUM) 5 MG tablet Take 1 tablet (5 mg total) by mouth at bedtime. 30 tablet 2   diclofenac Sodium (VOLTAREN) 1 % GEL  Apply 2 g topically 4 (four) times daily. 100 g 0   dicyclomine (BENTYL) 20 MG tablet Take 1 tablet (20 mg total) by mouth 2 (two) times daily as needed for spasms. 20 tablet 0   Docosanol 10 % CREA Apply 1 application topically daily as needed (for nose).      docusate sodium (COLACE) 100 MG capsule Take 100 mg by mouth daily.      erythromycin ophthalmic ointment Place 1 application into the left eye at bedtime. 3.5 g 0   escitalopram (LEXAPRO) 10 MG tablet TAKE (1) TABLET BY MOUTH ONCE DAILY. MAY START WITH 1/2 TABLET FOR 7 DAYS. 90 tablet 0   EUFLEXXA 20 MG/2ML SOSY SMARTSIG:1 Syringe(s) I-ARTIC Once a Week     fluticasone (FLONASE) 50 MCG/ACT nasal spray INSTILL 2 SPRAYS INTO BOTH NOSTRILS DAILY 16 g 1   losartan (COZAAR) 100 MG tablet Take 100 mg by mouth daily.     magnesium oxide (MAG-OX) 400 (241.3 Mg) MG tablet Take 1 tablet (400 mg total) by mouth 3 (three) times daily. 90 tablet 3   metFORMIN (GLUCOPHAGE) 500 MG tablet TAKE 1 TABLET BY MOUTH ONCE DAILY WITH BREAKFAST (Patient taking differently: Take 1,000 mg by mouth 2 (two) times daily with a meal.) 90 tablet 0   nitrofurantoin, macrocrystal-monohydrate, (MACROBID) 100 MG capsule Take 1 capsule (100 mg total) by mouth at bedtime. 90 capsule 3   ondansetron (ZOFRAN) 8 MG tablet TAKE 1 TABLET BY MOUTH EVERY 8 HOURS AS NEEDED. 30 tablet 6   pantoprazole (PROTONIX) 40 MG  tablet TAKE (1) TABLET BY MOUTH ONCE DAILY. 90 tablet 1   pregabalin (LYRICA) 200 MG capsule TAKE (1) CAPSULE BY MOUTH TWICE DAILY. 60 capsule 6   prochlorperazine (COMPAZINE) 10 MG tablet TAKE 1 TABLET BY MOUTH EVERY 6 HOURS AS NEEDED FOR NAUSEA OR VOMITING. 30 tablet 6   Propylene Glycol (SYSTANE BALANCE) 0.6 % SOLN Apply 1 drop to eye daily as needed (dry eye).      rosuvastatin (CRESTOR) 5 MG tablet Take 5 mg by mouth daily.     TRULICITY 1.5 QQ/7.6PP SOPN Inject 0.75 mg into the skin every Monday.      valACYclovir (VALTREX) 1000 MG tablet TAKE 2 TABLETS BY MOUTH NOW; THEN 2 12 HOURS LATER. 12 tablet 6   VELCADE 3.5 MG injection Inject 3.5 mg into the vein every 14 (fourteen) days. 2 each 0   No current facility-administered medications for this visit.    ALLERGIES:  No Known Allergies  PHYSICAL EXAM:  Performance status (ECOG): 1 - Symptomatic but completely ambulatory  There were no vitals filed for this visit. Wt Readings from Last 3 Encounters:  05/18/21 267 lb (121.1 kg)  05/17/21 268 lb 3.2 oz (121.7 kg)  04/19/21 264 lb 3.2 oz (119.8 kg)   Physical Exam Vitals reviewed.  Constitutional:      Appearance: Normal appearance.  Cardiovascular:     Rate and Rhythm: Normal rate and regular rhythm.     Pulses: Normal pulses.     Heart sounds: Normal heart sounds.  Pulmonary:     Effort: Pulmonary effort is normal.     Breath sounds: Normal breath sounds.  Musculoskeletal:     Right lower leg: No edema.     Left lower leg: No edema.  Neurological:     General: No focal deficit present.     Mental Status: She is alert and oriented to person, place, and time.  Psychiatric:  Mood and Affect: Mood normal.        Behavior: Behavior normal.     LABORATORY DATA:  I have reviewed the labs as listed.  CBC Latest Ref Rng & Units 05/17/2021 05/03/2021 05/01/2021  WBC 4.0 - 10.5 K/uL 5.7 5.6 5.6  Hemoglobin 12.0 - 15.0 g/dL 12.1 12.5 12.0  Hematocrit 36.0 - 46.0 % 36.0  37.7 36.7  Platelets 150 - 400 K/uL 184 175 174   CMP Latest Ref Rng & Units 05/17/2021 05/03/2021 05/01/2021  Glucose 70 - 99 mg/dL 154(H) 127(H) 98  BUN 6 - 20 mg/dL 25(H) 17 12  Creatinine 0.44 - 1.00 mg/dL 1.00 1.07(H) 0.99  Sodium 135 - 145 mmol/L 139 137 140  Potassium 3.5 - 5.1 mmol/L 3.9 3.7 3.9  Chloride 98 - 111 mmol/L 105 105 107  CO2 22 - 32 mmol/L 24 23 26   Calcium 8.9 - 10.3 mg/dL 8.6(L) 9.3 9.4  Total Protein 6.5 - 8.1 g/dL 6.7 6.8 6.0(L)  Total Bilirubin 0.3 - 1.2 mg/dL 0.5 0.5 0.6  Alkaline Phos 38 - 126 U/L 71 64 72  AST 15 - 41 U/L 17 19 16   ALT 0 - 44 U/L 13 14 13     DIAGNOSTIC IMAGING:  I have independently reviewed the scans and discussed with the patient. No results found.   ASSESSMENT:  1.  IgG lambda multiple myeloma, stage III, del 17 p: -4 cycles of RVD from 11/01/2017 through 01/14/2018. -Stem cell transplant on 02/20/2018 at [facility]. -Maintenance Velcade every 2 weeks and Revlimid 10 mg 3 weeks on/1 week off started on 07/29/2018. -Myeloma panel from 12/08/2018 shows M spike not observed.  Kappa light chains of 32.1 with ratio of 1.64.  Immunofixation was negative. 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  MRI of the brain on 12/27/2019 shows acute ischemic nonhemorrhagic left MCA territory infarct involving left parietal lobe, corresponding with perfusion deficit on CT scan angiogram.  No associated hemorrhage or mass-effect.  Additional few scattered punctate acute ischemic nonhemorrhagic infarcts involving bilateral frontal and parietal lobes as well as left cerebellum. - Revlimid was on hold since June 2021 due to potential for contributing to CVA. - Velcade dose was reduced to 1 mg per metered square on 12/28/2020 due to worsening neuropathy in the feet.   2.  CVA with aphasia: -We held her myeloma treatments since CVA with aphasia on 12/26/2019. -Her aphasia is improving.   PLAN:  1.  IgG lambda multiple myeloma, stage III, del 17 p: - Velcade was dose  reduced to 1 mg/m2 on 12/14/2020 secondary to worsening neuropathy. - We have reviewed myeloma panel from 05/17/2021.  M spike is negative.  Free light chain ratio is normal at 1.29.  Kappa light chains at 30.  Immunofixation was normal. - Reviewed labs from today which showed normal renal function and liver function.  CBC was normal. - We will continue Velcade every 2 weeks as maintenance as she continues to be in remission. - Reevaluate her in 12 weeks for follow-up with repeat myeloma labs 2 weeks prior.   2.  Hypomagnesemia: - Continue magnesium 400 mg 3 times daily.  Magnesium is 1.9.   3.  Bone strengthening agents: - Continue Zometa every 12 weeks.  Calcium is normal.  She will receive Zometa today.   4.  Peripheral neuropathy: - Her hands feel asleep all the time. - Continue Lyrica 200 mg twice daily.   5.  ID prophylaxis: - Continue acyclovir twice daily.   6.  Diabetes: - Continue metformin daily and Trulicity weekly.   7.  Recurrent UTIs: - Continue Macrobid 100 mg daily for prophylaxis.   Orders placed this encounter:  No orders of the defined types were placed in this encounter.    Derek Jack, MD [place] [phone]   I, Thana Ates, am acting as a scribe for Dr. Derek Jack.  I, Derek Jack MD, have reviewed the above documentation for accuracy and completeness, and I agree with the above.

## 2021-06-07 ENCOUNTER — Other Ambulatory Visit: Payer: Self-pay

## 2021-06-07 ENCOUNTER — Inpatient Hospital Stay (HOSPITAL_BASED_OUTPATIENT_CLINIC_OR_DEPARTMENT_OTHER): Payer: BC Managed Care – PPO | Admitting: Hematology

## 2021-06-07 ENCOUNTER — Inpatient Hospital Stay (HOSPITAL_COMMUNITY): Payer: BC Managed Care – PPO

## 2021-06-07 VITALS — BP 121/98 | HR 65 | Temp 96.7°F | Resp 18 | Wt 265.6 lb

## 2021-06-07 DIAGNOSIS — C9000 Multiple myeloma not having achieved remission: Secondary | ICD-10-CM

## 2021-06-07 DIAGNOSIS — Z5112 Encounter for antineoplastic immunotherapy: Secondary | ICD-10-CM | POA: Diagnosis not present

## 2021-06-07 LAB — COMPREHENSIVE METABOLIC PANEL
ALT: 12 U/L (ref 0–44)
AST: 17 U/L (ref 15–41)
Albumin: 4 g/dL (ref 3.5–5.0)
Alkaline Phosphatase: 75 U/L (ref 38–126)
Anion gap: 8 (ref 5–15)
BUN: 19 mg/dL (ref 6–20)
CO2: 24 mmol/L (ref 22–32)
Calcium: 9.1 mg/dL (ref 8.9–10.3)
Chloride: 107 mmol/L (ref 98–111)
Creatinine, Ser: 0.89 mg/dL (ref 0.44–1.00)
GFR, Estimated: >60 mL/min (ref >60)
Glucose, Bld: 110 mg/dL — ABNORMAL HIGH (ref 70–99)
Potassium: 3.8 mmol/L (ref 3.5–5.1)
Sodium: 139 mmol/L (ref 135–145)
Total Bilirubin: 0.5 mg/dL (ref 0.3–1.2)
Total Protein: 6.9 g/dL (ref 6.5–8.1)

## 2021-06-07 LAB — CBC WITH DIFFERENTIAL/PLATELET
Abs Immature Granulocytes: 0.02 10*3/uL (ref 0.00–0.07)
Basophils Absolute: 0.1 10*3/uL (ref 0.0–0.1)
Basophils Relative: 1 %
Eosinophils Absolute: 0.1 10*3/uL (ref 0.0–0.5)
Eosinophils Relative: 1 %
HCT: 36.7 % (ref 36.0–46.0)
Hemoglobin: 12.3 g/dL (ref 12.0–15.0)
Immature Granulocytes: 0 %
Lymphocytes Relative: 28 %
Lymphs Abs: 1.6 10*3/uL (ref 0.7–4.0)
MCH: 31.7 pg (ref 26.0–34.0)
MCHC: 33.5 g/dL (ref 30.0–36.0)
MCV: 94.6 fL (ref 80.0–100.0)
Monocytes Absolute: 0.6 10*3/uL (ref 0.1–1.0)
Monocytes Relative: 11 %
Neutro Abs: 3.3 10*3/uL (ref 1.7–7.7)
Neutrophils Relative %: 59 %
Platelets: 180 10*3/uL (ref 150–400)
RBC: 3.88 MIL/uL (ref 3.87–5.11)
RDW: 15.5 % (ref 11.5–15.5)
WBC: 5.6 10*3/uL (ref 4.0–10.5)
nRBC: 0 % (ref 0.0–0.2)

## 2021-06-07 LAB — MAGNESIUM: Magnesium: 1.9 mg/dL (ref 1.7–2.4)

## 2021-06-07 MED ORDER — ZOLEDRONIC ACID 4 MG/5ML IV CONC
3.0000 mg | Freq: Once | INTRAVENOUS | Status: AC
  Administered 2021-06-07: 3 mg via INTRAVENOUS
  Filled 2021-06-07: qty 3.75

## 2021-06-07 MED ORDER — SODIUM CHLORIDE 0.9 % IV SOLN: Freq: Once | INTRAVENOUS | Status: AC

## 2021-06-07 MED ORDER — BORTEZOMIB CHEMO SQ INJECTION 3.5 MG (2.5MG/ML)
1.0400 mg/m2 | Freq: Once | INTRAMUSCULAR | Status: AC
  Administered 2021-06-07: 2.5 mg via SUBCUTANEOUS
  Filled 2021-06-07: qty 1

## 2021-06-07 MED ORDER — PROCHLORPERAZINE MALEATE 10 MG PO TABS
10.0000 mg | ORAL_TABLET | Freq: Once | ORAL | Status: DC
  Filled 2021-06-07: qty 1

## 2021-06-07 NOTE — Progress Notes (Signed)
Patient presents today for Velcade injection and Zometa infusion.  Patient is in satisfactory condition with no complaints voiced.  Vital signs are stable.  Labs reviewed by Dr. Delton Coombes during her office visit.  All labs are within treatment parameters.  We will proceed with treatment per MD orders.

## 2021-06-07 NOTE — Patient Instructions (Signed)
[facility]  Discharge Instructions: Thank you for choosing [facility] to provide your oncology and hematology care.  If you have a lab appointment with the Cancer Center, please come in thru the Main Entrance and check in at the main information desk.  Wear comfortable clothing and clothing appropriate for easy access to any Portacath or PICC line.   We strive to give you quality time with your provider. You may need to reschedule your appointment if you arrive late (15 or more minutes).  Arriving late affects you and other patients whose appointments are after yours.  Also, if you miss three or more appointments without notifying the office, you may be dismissed from the clinic at the provider's discretion.      For prescription refill requests, have your pharmacy contact our office and allow 72 hours for refills to be completed.        To help prevent nausea and vomiting after your treatment, we encourage you to take your nausea medication as directed.  BELOW ARE SYMPTOMS THAT SHOULD BE REPORTED IMMEDIATELY: *FEVER GREATER THAN 100.4 F (38 C) OR HIGHER *CHILLS OR SWEATING *NAUSEA AND VOMITING THAT IS NOT CONTROLLED WITH YOUR NAUSEA MEDICATION *UNUSUAL SHORTNESS OF BREATH *UNUSUAL BRUISING OR BLEEDING *URINARY PROBLEMS (pain or burning when urinating, or frequent urination) *BOWEL PROBLEMS (unusual diarrhea, constipation, pain near the anus) TENDERNESS IN MOUTH AND THROAT WITH OR WITHOUT PRESENCE OF ULCERS (sore throat, sores in mouth, or a toothache) UNUSUAL RASH, SWELLING OR PAIN  UNUSUAL VAGINAL DISCHARGE OR ITCHING   Items with * indicate a potential emergency and should be followed up as soon as possible or go to the Emergency Department if any problems should occur.  Please show the CHEMOTHERAPY ALERT CARD or IMMUNOTHERAPY ALERT CARD at check-in to the Emergency Department and triage nurse.  Should you have questions after your visit or need to cancel  or reschedule your appointment, please contact [facility] [phone]  and follow the prompts.  Office hours are 8:00 a.m. to 4:30 p.m. Monday - Friday. Please note that voicemails left after 4:00 p.m. may not be returned until the following business day.  We are closed weekends and major holidays. You have access to a nurse at all times for urgent questions. Please call the main number to the clinic [phone] and follow the prompts.  For any non-urgent questions, you may also contact your provider using MyChart. We now offer e-Visits for anyone 18 and older to request care online for non-urgent symptoms. For details visit [email or web address].   Also download the MyChart app! Go to the app store, search "MyChart", open the app, select [facility], and log in with your MyChart username and password.  Due to Covid, a mask is required upon entering the hospital/clinic. If you do not have a mask, one will be given to you upon arrival. For doctor visits, patients may have 1 support person aged 18 or older with them. For treatment visits, patients cannot have anyone with them due to current Covid guidelines and our immunocompromised population.  

## 2021-06-07 NOTE — Patient Instructions (Signed)
[facility] [facility] Discharge Instructions   You were seen and examined today by Dr. Delton Coombes. We will proceed with your Velcade injection today and every 2 weeks. Return as scheduled in 3 months.    Thank you for choosing [place] [place] [facility] to provide your oncology and hematology care.  To afford each patient quality time with our provider, please arrive at least 15 minutes before your scheduled appointment time.   If you have a lab appointment with the [facility] please come in thru the Main Entrance and check in at the main information desk.  You need to re-schedule your appointment should you arrive 10 or more minutes late.  We strive to give you quality time with our providers, and arriving late affects you and other patients whose appointments are after yours.  Also, if you no show three or more times for appointments you may be dismissed from the clinic at the providers discretion.     Again, thank you for choosing [facility].  Our hope is that these requests will decrease the amount of time that you wait before being seen by our physicians.       _____________________________________________________________  Should you have questions after your visit to [facility], please contact our office at [phone] and follow the prompts.  Our office hours are 8:00 a.m. and 4:30 p.m. Monday - Friday.  Please note that voicemails left after 4:00 p.m. may not be returned until the following business day.  We are closed weekends and major holidays.  You do have access to a nurse 24-7, just call the main number to the clinic [phone] and do not press any options, hold on the line and a nurse will answer the phone.    For prescription refill requests, have your pharmacy contact our office and allow 72 hours.    Due to Covid, you will need to wear a mask upon entering the hospital. If you do not have a  mask, a mask will be given to you at the Main Entrance upon arrival. For doctor visits, patients may have 1 support person age 45 or older with them. For treatment visits, patients can not have anyone with them due to social distancing guidelines and our immunocompromised population.

## 2021-06-09 ENCOUNTER — Other Ambulatory Visit (HOSPITAL_COMMUNITY): Payer: Self-pay | Admitting: *Deleted

## 2021-06-09 ENCOUNTER — Telehealth (HOSPITAL_COMMUNITY): Payer: Self-pay | Admitting: *Deleted

## 2021-06-09 DIAGNOSIS — C9000 Multiple myeloma not having achieved remission: Secondary | ICD-10-CM

## 2021-06-09 NOTE — Telephone Encounter (Signed)
Velcade 2 doses ordered for delivery from [facility] for 12/14 & 12/28 treatments to be delivered to Henre[facility]ber on December 9 th.

## 2021-06-21 ENCOUNTER — Other Ambulatory Visit (HOSPITAL_COMMUNITY): Payer: Self-pay | Admitting: Hematology

## 2021-06-21 ENCOUNTER — Inpatient Hospital Stay (HOSPITAL_COMMUNITY): Payer: BC Managed Care – PPO

## 2021-06-21 ENCOUNTER — Inpatient Hospital Stay (HOSPITAL_COMMUNITY): Payer: BC Managed Care – PPO | Attending: Hematology

## 2021-06-21 ENCOUNTER — Encounter (HOSPITAL_COMMUNITY): Payer: Self-pay

## 2021-06-21 ENCOUNTER — Other Ambulatory Visit: Payer: Self-pay

## 2021-06-21 VITALS — BP 147/88 | HR 81 | Temp 96.1°F | Resp 18 | Ht 62.0 in | Wt 267.0 lb

## 2021-06-21 DIAGNOSIS — Z5112 Encounter for antineoplastic immunotherapy: Secondary | ICD-10-CM | POA: Insufficient documentation

## 2021-06-21 DIAGNOSIS — C9000 Multiple myeloma not having achieved remission: Secondary | ICD-10-CM

## 2021-06-21 DIAGNOSIS — Z79899 Other long term (current) drug therapy: Secondary | ICD-10-CM | POA: Insufficient documentation

## 2021-06-21 LAB — CBC WITH DIFFERENTIAL/PLATELET
Abs Immature Granulocytes: 0.04 10*3/uL (ref 0.00–0.07)
Basophils Absolute: 0 10*3/uL (ref 0.0–0.1)
Basophils Relative: 0 %
Eosinophils Absolute: 0 10*3/uL (ref 0.0–0.5)
Eosinophils Relative: 1 %
HCT: 37.6 % (ref 36.0–46.0)
Hemoglobin: 12.5 g/dL (ref 12.0–15.0)
Immature Granulocytes: 1 %
Lymphocytes Relative: 23 %
Lymphs Abs: 1.9 10*3/uL (ref 0.7–4.0)
MCH: 31.7 pg (ref 26.0–34.0)
MCHC: 33.2 g/dL (ref 30.0–36.0)
MCV: 95.4 fL (ref 80.0–100.0)
Monocytes Absolute: 0.8 10*3/uL (ref 0.1–1.0)
Monocytes Relative: 10 %
Neutro Abs: 5.6 10*3/uL (ref 1.7–7.7)
Neutrophils Relative %: 65 %
Platelets: 211 10*3/uL (ref 150–400)
RBC: 3.94 MIL/uL (ref 3.87–5.11)
RDW: 15 % (ref 11.5–15.5)
WBC: 8.4 10*3/uL (ref 4.0–10.5)
nRBC: 0 % (ref 0.0–0.2)

## 2021-06-21 LAB — COMPREHENSIVE METABOLIC PANEL
ALT: 12 U/L (ref 0–44)
AST: 19 U/L (ref 15–41)
Albumin: 3.9 g/dL (ref 3.5–5.0)
Alkaline Phosphatase: 72 U/L (ref 38–126)
Anion gap: 11 (ref 5–15)
BUN: 17 mg/dL (ref 6–20)
CO2: 21 mmol/L — ABNORMAL LOW (ref 22–32)
Calcium: 8.8 mg/dL — ABNORMAL LOW (ref 8.9–10.3)
Chloride: 105 mmol/L (ref 98–111)
Creatinine, Ser: 0.94 mg/dL (ref 0.44–1.00)
GFR, Estimated: >60 mL/min (ref >60)
Glucose, Bld: 139 mg/dL — ABNORMAL HIGH (ref 70–99)
Potassium: 3.5 mmol/L (ref 3.5–5.1)
Sodium: 137 mmol/L (ref 135–145)
Total Bilirubin: 0.4 mg/dL (ref 0.3–1.2)
Total Protein: 7.1 g/dL (ref 6.5–8.1)

## 2021-06-21 LAB — MAGNESIUM: Magnesium: 1.8 mg/dL (ref 1.7–2.4)

## 2021-06-21 MED ORDER — BORTEZOMIB CHEMO SQ INJECTION 3.5 MG (2.5MG/ML)
1.0400 mg/m2 | Freq: Once | INTRAMUSCULAR | Status: AC
  Administered 2021-06-21: 12:00:00 2.5 mg via SUBCUTANEOUS
  Filled 2021-06-21: qty 1

## 2021-06-21 NOTE — Progress Notes (Signed)
Patient presents today for Velcade injection. Labs reviewed and within parameters for tx today. Vital signs within parameters for treatment. MAR reviewed. Patient has no complaints of any significant changes since her last visit.   Velcade injection given today per MD orders. Tolerated without adverse affects. Vital signs stable. No complaints at this time. Discharged from clinic ambulatory in stable condition. Alert and oriented x 3. F/U with [facility] as scheduled.

## 2021-06-21 NOTE — Patient Instructions (Signed)
[place]  Discharge Instructions: Thank you for choosing [facility] to provide your oncology and hematology care.  If you have a lab appointment with the [facility], please come in thru the Main Entrance and check in at the main information desk.  Wear comfortable clothing and clothing appropriate for easy access to any Portacath or PICC line.   We strive to give you quality time with your provider. You may need to reschedule your appointment if you arrive late (15 or more minutes).  Arriving late affects you and other patients whose appointments are after yours.  Also, if you miss three or more appointments without notifying the office, you may be dismissed from the clinic at the providers discretion.      For prescription refill requests, have your pharmacy contact our office and allow 72 hours for refills to be completed.    Today you received the following chemotherapy and/or immunotherapy agents Velcade injection.       To help prevent nausea and vomiting after your treatment, we encourage you to take your nausea medication as directed.  BELOW ARE SYMPTOMS THAT SHOULD BE REPORTED IMMEDIATELY: *FEVER GREATER THAN 100.4 F (38 C) OR HIGHER *CHILLS OR SWEATING *NAUSEA AND VOMITING THAT IS NOT CONTROLLED WITH YOUR NAUSEA MEDICATION *UNUSUAL SHORTNESS OF BREATH *UNUSUAL BRUISING OR BLEEDING *URINARY PROBLEMS (pain or burning when urinating, or frequent urination) *BOWEL PROBLEMS (unusual diarrhea, constipation, pain near the anus) TENDERNESS IN MOUTH AND THROAT WITH OR WITHOUT PRESENCE OF ULCERS (sore throat, sores in mouth, or a toothache) UNUSUAL RASH, SWELLING OR PAIN  UNUSUAL VAGINAL DISCHARGE OR ITCHING   Items with * indicate a potential emergency and should be followed up as soon as possible or go to the Emergency Department if any problems should occur.  Please show the CHEMOTHERAPY ALERT CARD or IMMUNOTHERAPY ALERT CARD at check-in to the  Emergency Department and triage nurse.  Should you have questions after your visit or need to cancel or reschedule your appointment, please contact [facility] [phone]  and follow the prompts.  Office hours are 8:00 a.m. to 4:30 p.m. Monday - Friday. Please note that voicemails left after 4:00 p.m. may not be returned until the following business day.  We are closed weekends and major holidays. You have access to a nurse at all times for urgent questions. Please call the main number to the clinic [phone] and follow the prompts.  For any non-urgent questions, you may also contact your provider using MyChart. We now offer e-Visits for anyone 28 and older to request care online for non-urgent symptoms. For details visit [email or web address].   Also download the MyChart app! Go to the app store, search "MyChart", open the app, select [place], and log in with your MyChart username and password.  Due to Covid, a mask is required upon entering the hospital/clinic. If you do not have a mask, one will be given to you upon arrival. For doctor visits, patients may have 1 support person aged 61 or older with them. For treatment visits, patients cannot have anyone with them due to current Covid guidelines and our immunocompromised population.

## 2021-06-26 ENCOUNTER — Other Ambulatory Visit (HOSPITAL_COMMUNITY): Payer: Self-pay | Admitting: Physician Assistant

## 2021-06-26 DIAGNOSIS — C9000 Multiple myeloma not having achieved remission: Secondary | ICD-10-CM

## 2021-06-28 ENCOUNTER — Other Ambulatory Visit: Payer: Self-pay | Admitting: Internal Medicine

## 2021-06-28 DIAGNOSIS — F411 Generalized anxiety disorder: Secondary | ICD-10-CM

## 2021-06-29 ENCOUNTER — Encounter (HOSPITAL_COMMUNITY): Payer: Self-pay | Admitting: Hematology

## 2021-07-05 ENCOUNTER — Other Ambulatory Visit: Payer: Self-pay

## 2021-07-05 ENCOUNTER — Inpatient Hospital Stay (HOSPITAL_COMMUNITY): Payer: BC Managed Care – PPO

## 2021-07-05 VITALS — BP 133/80 | HR 69 | Temp 97.5°F | Resp 18

## 2021-07-05 DIAGNOSIS — C9000 Multiple myeloma not having achieved remission: Secondary | ICD-10-CM

## 2021-07-05 DIAGNOSIS — Z5112 Encounter for antineoplastic immunotherapy: Secondary | ICD-10-CM | POA: Diagnosis not present

## 2021-07-05 LAB — CBC WITH DIFFERENTIAL/PLATELET
Abs Immature Granulocytes: 0.04 10*3/uL (ref 0.00–0.07)
Basophils Absolute: 0 10*3/uL (ref 0.0–0.1)
Basophils Relative: 1 %
Eosinophils Absolute: 0 10*3/uL (ref 0.0–0.5)
Eosinophils Relative: 1 %
HCT: 36.6 % (ref 36.0–46.0)
Hemoglobin: 11.6 g/dL — ABNORMAL LOW (ref 12.0–15.0)
Immature Granulocytes: 1 %
Lymphocytes Relative: 23 %
Lymphs Abs: 1.3 10*3/uL (ref 0.7–4.0)
MCH: 31.3 pg (ref 26.0–34.0)
MCHC: 31.7 g/dL (ref 30.0–36.0)
MCV: 98.7 fL (ref 80.0–100.0)
Monocytes Absolute: 0.6 10*3/uL (ref 0.1–1.0)
Monocytes Relative: 10 %
Neutro Abs: 3.9 10*3/uL (ref 1.7–7.7)
Neutrophils Relative %: 64 %
Platelets: 156 10*3/uL (ref 150–400)
RBC: 3.71 MIL/uL — ABNORMAL LOW (ref 3.87–5.11)
RDW: 16.2 % — ABNORMAL HIGH (ref 11.5–15.5)
WBC: 5.9 10*3/uL (ref 4.0–10.5)
nRBC: 0 % (ref 0.0–0.2)

## 2021-07-05 LAB — MAGNESIUM: Magnesium: 2 mg/dL (ref 1.7–2.4)

## 2021-07-05 LAB — COMPREHENSIVE METABOLIC PANEL
ALT: 17 U/L (ref 0–44)
AST: 24 U/L (ref 15–41)
Albumin: 3.8 g/dL (ref 3.5–5.0)
Alkaline Phosphatase: 69 U/L (ref 38–126)
Anion gap: 8 (ref 5–15)
BUN: 23 mg/dL — ABNORMAL HIGH (ref 6–20)
CO2: 24 mmol/L (ref 22–32)
Calcium: 8.9 mg/dL (ref 8.9–10.3)
Chloride: 107 mmol/L (ref 98–111)
Creatinine, Ser: 0.84 mg/dL (ref 0.44–1.00)
GFR, Estimated: >60 mL/min (ref >60)
Glucose, Bld: 164 mg/dL — ABNORMAL HIGH (ref 70–99)
Potassium: 3.8 mmol/L (ref 3.5–5.1)
Sodium: 139 mmol/L (ref 135–145)
Total Bilirubin: 0.2 mg/dL — ABNORMAL LOW (ref 0.3–1.2)
Total Protein: 6.5 g/dL (ref 6.5–8.1)

## 2021-07-05 MED ORDER — BORTEZOMIB CHEMO SQ INJECTION 3.5 MG (2.5MG/ML)
1.0400 mg/m2 | Freq: Once | INTRAMUSCULAR | Status: AC
  Administered 2021-07-05: 12:00:00 2.5 mg via SUBCUTANEOUS
  Filled 2021-07-05: qty 1

## 2021-07-05 MED ORDER — PROCHLORPERAZINE MALEATE 10 MG PO TABS: 10.0000 mg | ORAL_TABLET | Freq: Once | ORAL | Status: DC

## 2021-07-05 NOTE — Progress Notes (Signed)
Labs meet parameters for treatment today.   Treatment given per orders. Patient tolerated it well without problems. Vitals stable and discharged home from clinic ambulatory. Follow up as scheduled.  

## 2021-07-05 NOTE — Patient Instructions (Signed)
[facility]  Discharge Instructions: Thank you for choosing [facility] to provide your oncology and hematology care.  If you have a lab appointment with the Cancer Center, please come in thru the Main Entrance and check in at the main information desk.  Wear comfortable clothing and clothing appropriate for easy access to any Portacath or PICC line.   We strive to give you quality time with your provider. You may need to reschedule your appointment if you arrive late (15 or more minutes).  Arriving late affects you and other patients whose appointments are after yours.  Also, if you miss three or more appointments without notifying the office, you may be dismissed from the clinic at the provider's discretion.      For prescription refill requests, have your pharmacy contact our office and allow 72 hours for refills to be completed.    Today you received the following chemotherapy and/or immunotherapy agents    To help prevent nausea and vomiting after your treatment, we encourage you to take your nausea medication as directed.  BELOW ARE SYMPTOMS THAT SHOULD BE REPORTED IMMEDIATELY: . *FEVER GREATER THAN 100.4 F (38 C) OR HIGHER . *CHILLS OR SWEATING . *NAUSEA AND VOMITING THAT IS NOT CONTROLLED WITH YOUR NAUSEA MEDICATION . *UNUSUAL SHORTNESS OF BREATH . *UNUSUAL BRUISING OR BLEEDING . *URINARY PROBLEMS (pain or burning when urinating, or frequent urination) . *BOWEL PROBLEMS (unusual diarrhea, constipation, pain near the anus) . TENDERNESS IN MOUTH AND THROAT WITH OR WITHOUT PRESENCE OF ULCERS (sore throat, sores in mouth, or a toothache) . UNUSUAL RASH, SWELLING OR PAIN  . UNUSUAL VAGINAL DISCHARGE OR ITCHING   Items with * indicate a potential emergency and should be followed up as soon as possible or go to the Emergency Department if any problems should occur.  Please show the CHEMOTHERAPY ALERT CARD or IMMUNOTHERAPY ALERT CARD at check-in to the  Emergency Department and triage nurse.  Should you have questions after your visit or need to cancel or reschedule your appointment, please contact [facility] [phone]  and follow the prompts.  Office hours are 8:00 a.m. to 4:30 p.m. Monday - Friday. Please note that voicemails left after 4:00 p.m. may not be returned until the following business day.  We are closed weekends and major holidays. You have access to a nurse at all times for urgent questions. Please call the main number to the clinic [phone] and follow the prompts.  For any non-urgent questions, you may also contact your provider using MyChart. We now offer e-Visits for anyone 18 and older to request care online for non-urgent symptoms. For details visit [email or web address].   Also download the MyChart app! Go to the app store, search "MyChart", open the app, select [facility], and log in with your MyChart username and password.  Due to Covid, a mask is required upon entering the hospital/clinic. If you do not have a mask, one will be given to you upon arrival. For doctor visits, patients may have 1 support person aged 18 or older with them. For treatment visits, patients cannot have anyone with them due to current Covid guidelines and our immunocompromised population.  

## 2021-07-12 ENCOUNTER — Encounter (HOSPITAL_COMMUNITY): Payer: Self-pay | Admitting: Hematology

## 2021-07-19 ENCOUNTER — Telehealth (HOSPITAL_COMMUNITY): Payer: Self-pay | Admitting: Hematology

## 2021-07-19 ENCOUNTER — Other Ambulatory Visit: Payer: Self-pay

## 2021-07-19 ENCOUNTER — Inpatient Hospital Stay (HOSPITAL_COMMUNITY): Payer: BC Managed Care – PPO | Attending: Hematology

## 2021-07-19 ENCOUNTER — Inpatient Hospital Stay (HOSPITAL_COMMUNITY): Payer: BC Managed Care – PPO

## 2021-07-19 ENCOUNTER — Encounter (HOSPITAL_COMMUNITY): Payer: Self-pay | Admitting: Hematology

## 2021-07-19 VITALS — BP 133/91 | HR 67 | Temp 96.7°F | Resp 20 | Wt 270.2 lb

## 2021-07-19 DIAGNOSIS — Z5112 Encounter for antineoplastic immunotherapy: Secondary | ICD-10-CM | POA: Insufficient documentation

## 2021-07-19 DIAGNOSIS — C9000 Multiple myeloma not having achieved remission: Secondary | ICD-10-CM

## 2021-07-19 LAB — CBC WITH DIFFERENTIAL/PLATELET
Abs Immature Granulocytes: 0.02 10*3/uL (ref 0.00–0.07)
Basophils Absolute: 0 10*3/uL (ref 0.0–0.1)
Basophils Relative: 1 %
Eosinophils Absolute: 0 10*3/uL (ref 0.0–0.5)
Eosinophils Relative: 1 %
HCT: 36.5 % (ref 36.0–46.0)
Hemoglobin: 11.8 g/dL — ABNORMAL LOW (ref 12.0–15.0)
Immature Granulocytes: 0 %
Lymphocytes Relative: 26 %
Lymphs Abs: 1.5 10*3/uL (ref 0.7–4.0)
MCH: 30.7 pg (ref 26.0–34.0)
MCHC: 32.3 g/dL (ref 30.0–36.0)
MCV: 95.1 fL (ref 80.0–100.0)
Monocytes Absolute: 0.5 10*3/uL (ref 0.1–1.0)
Monocytes Relative: 9 %
Neutro Abs: 3.7 10*3/uL (ref 1.7–7.7)
Neutrophils Relative %: 63 %
Platelets: 189 10*3/uL (ref 150–400)
RBC: 3.84 MIL/uL — ABNORMAL LOW (ref 3.87–5.11)
RDW: 15.7 % — ABNORMAL HIGH (ref 11.5–15.5)
WBC: 5.8 10*3/uL (ref 4.0–10.5)
nRBC: 0 % (ref 0.0–0.2)

## 2021-07-19 LAB — COMPREHENSIVE METABOLIC PANEL
ALT: 15 U/L (ref 0–44)
AST: 20 U/L (ref 15–41)
Albumin: 3.9 g/dL (ref 3.5–5.0)
Alkaline Phosphatase: 84 U/L (ref 38–126)
Anion gap: 8 (ref 5–15)
BUN: 21 mg/dL — ABNORMAL HIGH (ref 6–20)
CO2: 24 mmol/L (ref 22–32)
Calcium: 9.2 mg/dL (ref 8.9–10.3)
Chloride: 108 mmol/L (ref 98–111)
Creatinine, Ser: 0.95 mg/dL (ref 0.44–1.00)
GFR, Estimated: >60 mL/min (ref >60)
Glucose, Bld: 165 mg/dL — ABNORMAL HIGH (ref 70–99)
Potassium: 3.7 mmol/L (ref 3.5–5.1)
Sodium: 140 mmol/L (ref 135–145)
Total Bilirubin: 0.4 mg/dL (ref 0.3–1.2)
Total Protein: 6.9 g/dL (ref 6.5–8.1)

## 2021-07-19 LAB — MAGNESIUM: Magnesium: 1.9 mg/dL (ref 1.7–2.4)

## 2021-07-19 MED ORDER — BORTEZOMIB CHEMO SQ INJECTION 3.5 MG (2.5MG/ML)
1.0400 mg/m2 | Freq: Once | INTRAMUSCULAR | Status: AC
  Administered 2021-07-19: 2.5 mg via SUBCUTANEOUS
  Filled 2021-07-19: qty 1

## 2021-07-19 MED ORDER — PROCHLORPERAZINE MALEATE 10 MG PO TABS
10.0000 mg | ORAL_TABLET | Freq: Once | ORAL | Status: DC
  Filled 2021-07-19: qty 1

## 2021-07-19 NOTE — Patient Instructions (Signed)
[facility]  Discharge Instructions: Thank you for choosing [facility] to provide your oncology and hematology care.  If you have a lab appointment with the Cancer Center, please come in thru the Main Entrance and check in at the main information desk.  Wear comfortable clothing and clothing appropriate for easy access to any Portacath or PICC line.   We strive to give you quality time with your provider. You may need to reschedule your appointment if you arrive late (15 or more minutes).  Arriving late affects you and other patients whose appointments are after yours.  Also, if you miss three or more appointments without notifying the office, you may be dismissed from the clinic at the provider's discretion.      For prescription refill requests, have your pharmacy contact our office and allow 72 hours for refills to be completed.        To help prevent nausea and vomiting after your treatment, we encourage you to take your nausea medication as directed.  BELOW ARE SYMPTOMS THAT SHOULD BE REPORTED IMMEDIATELY: *FEVER GREATER THAN 100.4 F (38 C) OR HIGHER *CHILLS OR SWEATING *NAUSEA AND VOMITING THAT IS NOT CONTROLLED WITH YOUR NAUSEA MEDICATION *UNUSUAL SHORTNESS OF BREATH *UNUSUAL BRUISING OR BLEEDING *URINARY PROBLEMS (pain or burning when urinating, or frequent urination) *BOWEL PROBLEMS (unusual diarrhea, constipation, pain near the anus) TENDERNESS IN MOUTH AND THROAT WITH OR WITHOUT PRESENCE OF ULCERS (sore throat, sores in mouth, or a toothache) UNUSUAL RASH, SWELLING OR PAIN  UNUSUAL VAGINAL DISCHARGE OR ITCHING   Items with * indicate a potential emergency and should be followed up as soon as possible or go to the Emergency Department if any problems should occur.  Please show the CHEMOTHERAPY ALERT CARD or IMMUNOTHERAPY ALERT CARD at check-in to the Emergency Department and triage nurse.  Should you have questions after your visit or need to cancel  or reschedule your appointment, please contact [facility] [phone]  and follow the prompts.  Office hours are 8:00 a.m. to 4:30 p.m. Monday - Friday. Please note that voicemails left after 4:00 p.m. may not be returned until the following business day.  We are closed weekends and major holidays. You have access to a nurse at all times for urgent questions. Please call the main number to the clinic [phone] and follow the prompts.  For any non-urgent questions, you may also contact your provider using MyChart. We now offer e-Visits for anyone 18 and older to request care online for non-urgent symptoms. For details visit [email or web address].   Also download the MyChart app! Go to the app store, search "MyChart", open the app, select [facility], and log in with your MyChart username and password.  Due to Covid, a mask is required upon entering the hospital/clinic. If you do not have a mask, one will be given to you upon arrival. For doctor visits, patients may have 1 support person aged 18 or older with them. For treatment visits, patients cannot have anyone with them due to current Covid guidelines and our immunocompromised population.  

## 2021-07-19 NOTE — Telephone Encounter (Signed)
Pc to [facility] spoke with Brittney to request a delivery of Velcade to our pharmacy. Delivery was scheduled for 07/26/2021.   FYI! MUST USE [facility] [address] TO CONFIRM PT  [place] [address]

## 2021-07-19 NOTE — Progress Notes (Signed)
Patient presents today for Velcade injection.  Patient is in satisfactory condition with no complaints voiced. Vital signs are stable.  Labs reviewed and all are within treatment parameters.  We will proceed with treatment per MD orders.   Patient tolerated injection with no complaints voiced.  Site clean and dry with no bruising or swelling noted.  No complaints of pain.  Discharged with vital signs stable and no signs or symptoms of distress noted.

## 2021-07-21 ENCOUNTER — Other Ambulatory Visit: Payer: Self-pay | Admitting: Family Medicine

## 2021-07-21 DIAGNOSIS — K219 Gastro-esophageal reflux disease without esophagitis: Secondary | ICD-10-CM

## 2021-07-22 ENCOUNTER — Other Ambulatory Visit: Payer: Self-pay | Admitting: Internal Medicine

## 2021-07-22 DIAGNOSIS — K219 Gastro-esophageal reflux disease without esophagitis: Secondary | ICD-10-CM

## 2021-07-27 ENCOUNTER — Other Ambulatory Visit: Payer: Self-pay | Admitting: Internal Medicine

## 2021-07-27 ENCOUNTER — Other Ambulatory Visit (HOSPITAL_COMMUNITY): Payer: Self-pay | Admitting: Hematology

## 2021-07-27 DIAGNOSIS — C9000 Multiple myeloma not having achieved remission: Secondary | ICD-10-CM

## 2021-07-27 DIAGNOSIS — G620 Drug-induced polyneuropathy: Secondary | ICD-10-CM

## 2021-07-27 DIAGNOSIS — F411 Generalized anxiety disorder: Secondary | ICD-10-CM

## 2021-08-01 LAB — HM DIABETES EYE EXAM

## 2021-08-02 ENCOUNTER — Inpatient Hospital Stay (HOSPITAL_COMMUNITY): Payer: BC Managed Care – PPO

## 2021-08-02 ENCOUNTER — Other Ambulatory Visit: Payer: Self-pay

## 2021-08-02 VITALS — BP 133/98 | HR 74 | Temp 96.6°F | Resp 20 | Ht 62.6 in | Wt 272.2 lb

## 2021-08-02 DIAGNOSIS — C9000 Multiple myeloma not having achieved remission: Secondary | ICD-10-CM

## 2021-08-02 LAB — COMPREHENSIVE METABOLIC PANEL
ALT: 13 U/L (ref 0–44)
AST: 22 U/L (ref 15–41)
Albumin: 4 g/dL (ref 3.5–5.0)
Alkaline Phosphatase: 77 U/L (ref 38–126)
Anion gap: 10 (ref 5–15)
BUN: 13 mg/dL (ref 6–20)
CO2: 24 mmol/L (ref 22–32)
Calcium: 9 mg/dL (ref 8.9–10.3)
Chloride: 102 mmol/L (ref 98–111)
Creatinine, Ser: 0.94 mg/dL (ref 0.44–1.00)
GFR, Estimated: >60 mL/min (ref >60)
Glucose, Bld: 129 mg/dL — ABNORMAL HIGH (ref 70–99)
Potassium: 3.9 mmol/L (ref 3.5–5.1)
Sodium: 136 mmol/L (ref 135–145)
Total Bilirubin: 0.6 mg/dL (ref 0.3–1.2)
Total Protein: 7 g/dL (ref 6.5–8.1)

## 2021-08-02 LAB — CBC WITH DIFFERENTIAL/PLATELET
Abs Immature Granulocytes: 0.01 10*3/uL (ref 0.00–0.07)
Basophils Absolute: 0 10*3/uL (ref 0.0–0.1)
Basophils Relative: 1 %
Eosinophils Absolute: 0 10*3/uL (ref 0.0–0.5)
Eosinophils Relative: 1 %
HCT: 37.6 % (ref 36.0–46.0)
Hemoglobin: 12.2 g/dL (ref 12.0–15.0)
Immature Granulocytes: 0 %
Lymphocytes Relative: 30 %
Lymphs Abs: 1.5 10*3/uL (ref 0.7–4.0)
MCH: 31.4 pg (ref 26.0–34.0)
MCHC: 32.4 g/dL (ref 30.0–36.0)
MCV: 96.7 fL (ref 80.0–100.0)
Monocytes Absolute: 0.5 10*3/uL (ref 0.1–1.0)
Monocytes Relative: 9 %
Neutro Abs: 3 10*3/uL (ref 1.7–7.7)
Neutrophils Relative %: 59 %
Platelets: 185 10*3/uL (ref 150–400)
RBC: 3.89 MIL/uL (ref 3.87–5.11)
RDW: 15.1 % (ref 11.5–15.5)
WBC: 5.1 10*3/uL (ref 4.0–10.5)
nRBC: 0 % (ref 0.0–0.2)

## 2021-08-02 LAB — MAGNESIUM: Magnesium: 1.9 mg/dL (ref 1.7–2.4)

## 2021-08-02 MED ORDER — BORTEZOMIB CHEMO SQ INJECTION 3.5 MG (2.5MG/ML)
1.0400 mg/m2 | Freq: Once | INTRAMUSCULAR | Status: AC
  Administered 2021-08-02: 2.5 mg via SUBCUTANEOUS
  Filled 2021-08-02: qty 1

## 2021-08-02 NOTE — Progress Notes (Signed)
Patient presents today for Velcade injection per providers order.  Vital signs WNL.  Patient took compazine at home prior to coming to the clinic.  No new complaints at this time.    Velcade administration without incident; injection site WNL; see MAR for injection details.  Patient tolerated procedure well and without incident.  No questions or complaints noted at this time. Discharge from clinic ambulatory in stable condition.  Alert and oriented X 3.  Follow up with [facility] as scheduled.

## 2021-08-02 NOTE — Patient Instructions (Signed)
[facility]  Discharge Instructions: Thank you for choosing [facility] to provide your oncology and hematology care.  If you have a lab appointment with the [facility], please come in thru the Main Entrance and check in at the main information desk.  Wear comfortable clothing and clothing appropriate for easy access to any Portacath or PICC line.   We strive to give you quality time with your provider. You may need to reschedule your appointment if you arrive late (15 or more minutes).  Arriving late affects you and other patients whose appointments are after yours.  Also, if you miss three or more appointments without notifying the office, you may be dismissed from the clinic at the providers discretion.      For prescription refill requests, have your pharmacy contact our office and allow 72 hours for refills to be completed.    Today you received the following chemotherapy and/or immunotherapy agents Taxotere, [place]      To help prevent nausea and vomiting after your treatment, we encourage you to take your nausea medication as directed.  BELOW ARE SYMPTOMS THAT SHOULD BE REPORTED IMMEDIATELY: *FEVER GREATER THAN 100.4 F (38 C) OR HIGHER *CHILLS OR SWEATING *NAUSEA AND VOMITING THAT IS NOT CONTROLLED WITH YOUR NAUSEA MEDICATION *UNUSUAL SHORTNESS OF BREATH *UNUSUAL BRUISING OR BLEEDING *URINARY PROBLEMS (pain or burning when urinating, or frequent urination) *BOWEL PROBLEMS (unusual diarrhea, constipation, pain near the anus) TENDERNESS IN MOUTH AND THROAT WITH OR WITHOUT PRESENCE OF ULCERS (sore throat, sores in mouth, or a toothache) UNUSUAL RASH, SWELLING OR PAIN  UNUSUAL VAGINAL DISCHARGE OR ITCHING   Items with * indicate a potential emergency and should be followed up as soon as possible or go to the Emergency Department if any problems should occur.  Please show the CHEMOTHERAPY ALERT CARD or IMMUNOTHERAPY ALERT CARD at check-in to the  Emergency Department and triage nurse.  Should you have questions after your visit or need to cancel or reschedule your appointment, please contact [facility] [phone]  and follow the prompts.  Office hours are 8:00 a.m. to 4:30 p.m. Monday - Friday. Please note that voicemails left after 4:00 p.m. may not be returned until the following business day.  We are closed weekends and major holidays. You have access to a nurse at all times for urgent questions. Please call the main number to the clinic [phone] and follow the prompts.  For any non-urgent questions, you may also contact your provider using MyChart. We now offer e-Visits for anyone 67 and older to request care online for non-urgent symptoms. For details visit [email or web address].   Also download the MyChart app! Go to the app store, search "MyChart", open the app, select [facility], and log in with your MyChart username and password.  Due to Covid, a mask is required upon entering the hospital/clinic. If you do not have a mask, one will be given to you upon arrival. For doctor visits, patients may have 1 support person aged 109 or older with them. For treatment visits, patients cannot have anyone with them due to current Covid guidelines and our immunocompromised population.

## 2021-08-07 ENCOUNTER — Encounter: Payer: Self-pay | Admitting: *Deleted

## 2021-08-07 ENCOUNTER — Encounter (HOSPITAL_COMMUNITY): Payer: Self-pay | Admitting: Hematology

## 2021-08-07 ENCOUNTER — Other Ambulatory Visit: Payer: Self-pay

## 2021-08-07 ENCOUNTER — Encounter: Payer: Self-pay | Admitting: Internal Medicine

## 2021-08-07 ENCOUNTER — Ambulatory Visit (INDEPENDENT_AMBULATORY_CARE_PROVIDER_SITE_OTHER): Payer: BC Managed Care – PPO | Admitting: Internal Medicine

## 2021-08-07 VITALS — BP 132/88 | HR 67 | Resp 18 | Ht 64.0 in | Wt 268.1 lb

## 2021-08-07 DIAGNOSIS — C9000 Multiple myeloma not having achieved remission: Secondary | ICD-10-CM

## 2021-08-07 DIAGNOSIS — I10 Essential (primary) hypertension: Secondary | ICD-10-CM | POA: Diagnosis not present

## 2021-08-07 DIAGNOSIS — Z0001 Encounter for general adult medical examination with abnormal findings: Secondary | ICD-10-CM | POA: Diagnosis not present

## 2021-08-07 DIAGNOSIS — H00025 Hordeolum internum left lower eyelid: Secondary | ICD-10-CM

## 2021-08-07 DIAGNOSIS — Z1159 Encounter for screening for other viral diseases: Secondary | ICD-10-CM

## 2021-08-07 DIAGNOSIS — E1169 Type 2 diabetes mellitus with other specified complication: Secondary | ICD-10-CM

## 2021-08-07 DIAGNOSIS — Z6841 Body Mass Index (BMI) 40.0 and over, adult: Secondary | ICD-10-CM

## 2021-08-07 DIAGNOSIS — N200 Calculus of kidney: Secondary | ICD-10-CM | POA: Diagnosis not present

## 2021-08-07 DIAGNOSIS — I6930 Unspecified sequelae of cerebral infarction: Secondary | ICD-10-CM

## 2021-08-07 DIAGNOSIS — Z0000 Encounter for general adult medical examination without abnormal findings: Secondary | ICD-10-CM

## 2021-08-07 DIAGNOSIS — Z9481 Bone marrow transplant status: Secondary | ICD-10-CM

## 2021-08-07 DIAGNOSIS — M1A9XX0 Chronic gout, unspecified, without tophus (tophi): Secondary | ICD-10-CM

## 2021-08-07 LAB — POCT GLYCOSYLATED HEMOGLOBIN (HGB A1C)
HbA1c, POC (controlled diabetic range): 6.1 % (ref 0.0–7.0)
HbA1c, POC (prediabetic range): 6.1 % (ref 5.7–6.4)

## 2021-08-07 MED ORDER — ERYTHROMYCIN 5 MG/GM OP OINT: 1.0000 "application " | TOPICAL_OINTMENT | Freq: Every day | OPHTHALMIC | 0 refills | Status: DC

## 2021-08-07 NOTE — Patient Instructions (Signed)
Please continue taking medications as prescribed.  Please continue to follow low carb and low salt diet and ambulate as tolerated.  Please get fasting blood tests done before the next visit.

## 2021-08-08 ENCOUNTER — Encounter: Payer: Self-pay | Admitting: *Deleted

## 2021-08-08 ENCOUNTER — Ambulatory Visit (INDEPENDENT_AMBULATORY_CARE_PROVIDER_SITE_OTHER): Payer: BC Managed Care – PPO

## 2021-08-08 DIAGNOSIS — Z0000 Encounter for general adult medical examination without abnormal findings: Secondary | ICD-10-CM | POA: Diagnosis not present

## 2021-08-08 NOTE — Patient Instructions (Signed)

## 2021-08-08 NOTE — Progress Notes (Signed)
Subjective:   Joanna Reid is a 56 y.o. female who presents for an Initial Medicare Annual Wellness Visit. I connected with  Joanna Reid on 08/08/21 by a audio enabled telemedicine application and verified that I am speaking with the correct person using two identifiers.  Patient Location: Home  Provider Location: Office/Clinic  I discussed the limitations of evaluation and management by telemedicine. The patient expressed understanding and agreed to proceed.  Review of Systems    Defer to PCP Cardiac Risk Factors include: diabetes mellitus;hypertension;obesity (BMI >30kg/m2)     Objective:    Today's Vitals   08/08/21 1158  PainSc: 0-No pain   There is no height or weight on file to calculate BMI.  Advanced Directives 08/08/2021 08/02/2021 07/19/2021 06/21/2021 06/07/2021 05/17/2021 05/03/2021  Does Patient Have a Medical Advance Directive? Yes Yes Yes Yes Yes Yes Yes  Type of Paramedic of [place];Living will [place];Living will [place];Living will [place];Living will [place];Living will [place];Living will [place];Living will  Does patient want to make changes to medical advance directive? - - No - Patient declined No - Patient declined No - Patient declined - -  Copy of [place] in Chart? No - copy requested No - copy requested No - copy requested No - copy requested No - copy requested No - copy requested No - copy requested  Would patient like information on creating a medical advance directive? No - Patient declined No - Patient declined No - Patient declined No - Patient declined No - Patient declined No - Patient declined No - Patient declined  Pre-existing out of facility DNR order (yellow form or pink MOST form) - - - - - - -    Current Medications (verified) Outpatient Encounter Medications as of  08/08/2021  Medication Sig   acyclovir (ZOVIRAX) 400 MG tablet TAKE (1) TABLET BY MOUTH TWICE DAILY.   albuterol (VENTOLIN HFA) 108 (90 Base) MCG/ACT inhaler Inhale 2 puffs into the lungs every 4 (four) hours as needed for wheezing or shortness of breath.   allopurinol (ZYLOPRIM) 300 MG tablet TAKE (1) TABLET BY MOUTH ONCE DAILY.   amLODipine (NORVASC) 2.5 MG tablet Take 2.5 mg by mouth daily.   aspirin EC 81 MG tablet Take 1 tablet (81 mg total) by mouth daily.   bumetanide (BUMEX) 0.5 MG tablet Take 0.5 mg by mouth daily.    carvedilol (COREG) 25 MG tablet Take 1 tablet (25 mg total) by mouth 2 (two) times daily.   diazepam (VALIUM) 5 MG tablet TAKE (1) TABLET BY MOUTH AT BEDTIME.   diclofenac Sodium (VOLTAREN) 1 % GEL Apply 2 g topically 4 (four) times daily.   dicyclomine (BENTYL) 20 MG tablet Take 1 tablet (20 mg total) by mouth 2 (two) times daily as needed for spasms.   Docosanol 10 % CREA Apply 1 application topically daily as needed (for nose).    docusate sodium (COLACE) 100 MG capsule Take 100 mg by mouth daily.    erythromycin ophthalmic ointment Place 1 application into the left eye at bedtime.   escitalopram (LEXAPRO) 10 MG tablet TAKE (1) TABLET BY MOUTH ONCE DAILY. MAY START WITH 1/2 TABLET FOR 7 DAYS.   EUFLEXXA 20 MG/2ML SOSY SMARTSIG:1 Syringe(s) I-ARTIC Once a Week   fluticasone (FLONASE) 50 MCG/ACT nasal spray INSTILL 2 SPRAYS INTO BOTH NOSTRILS DAILY   losartan (COZAAR) 100 MG  tablet Take 100 mg by mouth daily.   magnesium oxide (MAG-OX) 400 (241.3 Mg) MG tablet Take 1 tablet (400 mg total) by mouth 3 (three) times daily.   metFORMIN (GLUCOPHAGE) 500 MG tablet TAKE 1 TABLET BY MOUTH ONCE DAILY WITH BREAKFAST (Patient taking differently: Take 1,000 mg by mouth 2 (two) times daily with a meal.)   nitrofurantoin, macrocrystal-monohydrate, (MACROBID) 100 MG capsule Take 1 capsule (100 mg total) by mouth at bedtime.   ondansetron (ZOFRAN) 8 MG tablet TAKE 1 TABLET BY MOUTH EVERY  8 HOURS AS NEEDED.   pantoprazole (PROTONIX) 40 MG tablet TAKE (1) TABLET BY MOUTH ONCE DAILY.   pregabalin (LYRICA) 200 MG capsule TAKE (1) CAPSULE BY MOUTH TWICE DAILY.   prochlorperazine (COMPAZINE) 10 MG tablet TAKE 1 TABLET BY MOUTH EVERY 6 HOURS AS NEEDED FOR NAUSEA OR VOMITING.   Propylene Glycol (SYSTANE BALANCE) 0.6 % SOLN Apply 1 drop to eye daily as needed (dry eye).    rosuvastatin (CRESTOR) 5 MG tablet Take 5 mg by mouth daily.   TRULICITY 1.5 PP/2.9JJ SOPN Inject 0.75 mg into the skin every Monday.    valACYclovir (VALTREX) 1000 MG tablet TAKE 2 TABLETS BY MOUTH NOW; THEN 2 12 HOURS LATER.   VELCADE 3.5 MG injection RECONSTITUTE EACH VIAL AS DIRECTED. INJECT 2.5 MG UNDER THE SKIN EVERY 14 DAYS.   No facility-administered encounter medications on file as of 08/08/2021.    Allergies (verified) Patient has no known allergies.   History: Past Medical History:  Diagnosis Date   Acid reflux    Allergic rhinitis    Cancer ([place])    multiple myeloma   Diabetes mellitus    type 2   Gout    Gout    H/o COVID-19--- was Positive 09/17/2019, Negative 12/23/19 AND also Neg on  12/26/19 12/26/2019   HBP (high blood pressure)    History of kidney stones    Migraines    Past Surgical History:  Procedure Laterality Date   BREAST CYST EXCISION Left    2009 no visible scar on skin   CESAREAN SECTION     COLONOSCOPY WITH PROPOFOL N/A 12/25/2019   Procedure: COLONOSCOPY WITH PROPOFOL;  Surgeon: Rogene Houston, MD;  [facility];  Service: Endoscopy;  Laterality: N/A;  730   EXTRACORPOREAL SHOCK WAVE LITHOTRIPSY Left 10/10/2017   Procedure: LEFT EXTRACORPOREAL SHOCK WAVE LITHOTRIPSY (ESWL);  Surgeon: Bjorn Loser, MD;  Location: [facility];  Service: Urology;  Laterality: Left;   EYE SURGERY     HEMORRHOID SURGERY N/A 11/19/2012   Procedure: HEMORRHOIDECTOMY;  Surgeon: Jamesetta So, MD;  Location: [facility];  Service: General;  Laterality: N/A;   kidney stones  1998    LAPAROSCOPIC UNILATERAL SALPINGO OOPHERECTOMY  05/14/2012   Procedure: LAPAROSCOPIC UNILATERAL SALPINGO OOPHORECTOMY;  Surgeon: [name], MD;  Location: [facility];  Service: Gynecology;  Laterality: Right;  laparoscopic right salpingo-oophorectomy   PARTIAL HYSTERECTOMY     POLYPECTOMY  12/25/2019   Procedure: POLYPECTOMY;  Surgeon: Rogene Houston, MD;  [facility];  Service: Endoscopy;;   TONSILECTOMY, ADENOIDECTOMY, BILATERAL MYRINGOTOMY AND TUBES     VESICOVAGINAL FISTULA CLOSURE W/ TAH     Family History  Problem Relation Age of Onset   Arthritis Other    Cancer Other    Diabetes Other    Hypertension Mother    Dementia Mother    Diabetes Father    ALS Father    Diabetes Brother    Hypertension  Brother    Cancer Paternal Aunt    COPD Maternal Grandmother    Cancer Maternal Grandfather    Anesthesia problems Paternal Grandfather    Social History   Socioeconomic History   Marital status: Married    Spouse name: [name]   Number of children: 1   Years of education: 12   Highest education level: Some college, no degree  Occupational History    Employer: [employer]  Tobacco Use   Smoking status: Former    Types: Cigarettes    Quit date: 02/27/1999    Years since quitting: 22.4   Smokeless tobacco: Never   Tobacco comments:    socially   Vaping Use   Vaping Use: Never used  Substance and Sexual Activity   Alcohol use: No    Alcohol/week: 0.0 standard drinks   Drug use: No   Sexual activity: Yes    Birth control/protection: Surgical    Comment: hyst  Other Topics Concern   Not on file  Social History Narrative   Not on file   Social Determinants of Health   Financial Resource Strain: Low Risk    Difficulty of Paying Living Expenses: Not hard at all  Food Insecurity: Not on file  Transportation Needs: No Transportation Needs   Lack of Transportation (Medical): No   Lack of Transportation (Non-Medical): No  Physical Activity: Unknown   Days of  Exercise per Week: Not on file   Minutes of Exercise per Session: 10 min  Stress: No Stress Concern Present   Feeling of Stress : Not at all  Social Connections: Socially Integrated   Frequency of Communication with Friends and Family: More than three times a week   Frequency of Social Gatherings with Friends and Family: Twice a week   Attends Religious Services: More than 4 times per year   Active Member of [employer] or Organizations: Yes   Attends Music therapist: More than 4 times per year   Marital Status: Married    Tobacco Counseling Counseling given: Not Answered Tobacco comments: socially    Clinical Intake:  Pre-visit preparation completed: No  Pain : No/denies pain Pain Score: 0-No pain     Nutritional Risks: None Diabetes: Yes CBG done?: No Did pt. bring in CBG monitor from home?: No  How often do you need to have someone help you when you read instructions, pamphlets, or other written materials from your doctor or pharmacy?: 1 - Never What is the last grade level you completed in school?: 12  Diabetic?Nutrition Risk Assessment:  Has the patient had any N/V/D within the last 2 months?  Yes  Does the patient have any non-healing wounds?  No  Has the patient had any unintentional weight loss or weight gain?  No   Diabetes:  Is the patient diabetic?  Yes  If diabetic, was a CBG obtained today?  No  Did the patient bring in their glucometer from home?  No  How often do you monitor your CBG's? 1-2 times weekly.   Financial Strains and Diabetes Management:  Are you having any financial strains with the device, your supplies or your medication? No .  Does the patient want to be seen by Chronic Care Management for management of their diabetes?  No  Would the patient like to be referred to a Nutritionist or for Diabetic Management?  No   Diabetic Exams:  Diabetic Eye Exam: Completed 08/01/2021 Diabetic Foot Exam: Completed 08/07/2021     Interpreter Needed?: No  Information entered by :: Judeen Hammans   Activities of Daily Living In your present state of health, do you have any difficulty performing the following activities: 08/08/2021  Hearing? N  Vision? N  Difficulty concentrating or making decisions? N  Walking or climbing stairs? Y  Comment climbing stairs due to knees  Dressing or bathing? N  Doing errands, shopping? N  Preparing Food and eating ? N  Using the Toilet? N  In the past six months, have you accidently leaked urine? N  Do you have problems with loss of bowel control? N  Managing your Medications? N  Managing your Finances? N  Housekeeping or managing your Housekeeping? N  Some recent data might be hidden    Patient Care Team: Lindell Spar, MD as PCP - General (Internal Medicine) Derek Jack, MD as Consulting Physician (Hematology)  Indicate any recent Medical Services you may have received from other than [facility] providers in the past year (date may be approximate).     Assessment:   This is a routine wellness examination for Joanna Reid.  Hearing/Vision screen No results found.  Dietary issues and exercise activities discussed: Current Exercise Habits: Home exercise routine, Exercise limited by: Other - see comments (cancer pt)   Goals Addressed   None   Depression Screen PHQ 2/9 Scores 08/08/2021 08/07/2021 04/05/2021 11/01/2020 02/16/2020 11/09/2019 10/13/2019  PHQ - 2 Score 0 0 0 0 0 0 0  PHQ- 9 Score - - - - - 0 1    Fall Risk Fall Risk  08/08/2021 08/07/2021 04/05/2021 11/01/2020 11/09/2019  Falls in the past year? 0 0 0 0 0  Number falls in past yr: 0 0 0 0 0  Injury with Fall? 0 0 0 0 0  Risk for fall due to : No Fall Risks No Fall Risks No Fall Risks - -  Follow up Falls evaluation completed Falls evaluation completed Falls evaluation completed Falls evaluation completed Falls evaluation completed    FALL RISK PREVENTION PERTAINING TO THE HOME:  Any stairs in or around the home?  Yes  If so, are there any without handrails? Yes  Home free of loose throw rugs in walkways, pet beds, electrical cords, etc? Yes  Adequate lighting in your home to reduce risk of falls? Yes   ASSISTIVE DEVICES UTILIZED TO PREVENT FALLS:  Life alert? No  Use of a cane, walker or w/c? No  Grab bars in the bathroom? No  Shower chair or bench in shower? No  Elevated toilet seat or a handicapped toilet? No    Cognitive Function:     6CIT Screen 08/08/2021  What Year? 0 points  What month? 0 points  What time? 0 points  Count back from 20 0 points    Immunizations Immunization History  Administered Date(s) Administered   DTaP / HiB / IPV 03/05/2019, 06/03/2019, 09/09/2019   Hepatitis B, adult 06/03/2019, 09/09/2019   Influenza,inj,Quad PF,6+ Mos 04/29/2017, 08/20/2018, 04/16/2019, 05/04/2020, 05/03/2021   Influenza-Unspecified 04/20/2015, 04/18/2016, 04/29/2017, 08/20/2018, 04/16/2019, 05/04/2020   MMR 03/09/2020   PFIZER Comirnaty(Gray Top)Covid-19 Tri-Sucrose Vaccine 02/10/2020   PFIZER(Purple Top)SARS-COV-2 Vaccination 02/10/2020, 03/02/2020, 09/15/2020   Pneumococcal Conjugate-13 03/05/2019, 06/03/2019, 09/09/2019   Pneumococcal Polysaccharide-23 03/09/2020   Zoster Recombinat (Shingrix) 03/11/2019, 05/22/2019    TDAP status: Up to date  Flu Vaccine status: Up to date  Pneumococcal vaccine status: Due, Education has been provided regarding the importance of this vaccine. Advised may receive this vaccine at local pharmacy or Health Dept. Aware to provide a  copy of the vaccination record if obtained from local pharmacy or Health Dept. Verbalized acceptance and understanding.  Covid-19 vaccine status: Information provided on how to obtain vaccines.   Qualifies for Shingles Vaccine? Yes   Zostavax completed No   Shingrix Completed?: Yes  Screening Tests Health Maintenance  Topic Date Due   Hepatitis C Screening  Never done   COVID-19 Vaccine (5 - Booster) 11/10/2020    HEMOGLOBIN A1C  02/04/2022   OPHTHALMOLOGY EXAM  08/01/2022   FOOT EXAM  08/07/2022   MAMMOGRAM  01/13/2023   TETANUS/TDAP  01/19/2024   COLONOSCOPY (Pts 45-86yr Insurance coverage will need to be confirmed)  12/25/2026   INFLUENZA VACCINE  Completed   HIV Screening  Completed   Zoster Vaccines- Shingrix  Completed   HPV VACCINES  Aged Out    Health Maintenance  Health Maintenance Due  Topic Date Due   Hepatitis C Screening  Never done   COVID-19 Vaccine (5 - Booster) 11/10/2020    Colorectal cancer screening: Type of screening: Colonoscopy. Completed 12/25/2019. Repeat every 7 years  Mammogram status: Completed 01/12/2021. Repeat every year  Lung Cancer Screening: (Low Dose CT Chest recommended if Age 57-80years, 30 pack-year currently smoking OR have quit w/in 15years.) does not qualify.   Lung Cancer Screening Referral: n/a  Additional Screening:  Hepatitis C Screening: does qualify; Completed at next office visit  Vision Screening: Recommended annual ophthalmology exams for early detection of glaucoma and other disorders of the eye. Is the patient up to date with their annual eye exam?  Yes  Who is the provider or what is the name of the office in which the patient attends annual eye exams? Dr CJorja LoaIf pt is not established with a provider, would they like to be referred to a provider to establish care? No .   Dental Screening: Recommended annual dental exams for proper oral hygiene  Community Resource Referral / Chronic Care Management: CRR required this visit?  No   CCM required this visit?  No      Plan:     I have personally reviewed and noted the following in the patients chart:   Medical and social history Use of alcohol, tobacco or illicit drugs  Current medications and supplements including opioid prescriptions. Patient is not currently taking opioid prescriptions. Functional ability and status Nutritional status Physical activity Advanced  directives List of other physicians Hospitalizations, surgeries, and ER visits in previous 12 months Vitals Screenings to include cognitive, depression, and falls Referrals and appointments  In addition, I have reviewed and discussed with patient certain preventive protocols, quality metrics, and best practice recommendations. A written personalized care plan for preventive services as well as general preventive health recommendations were provided to patient.     SEarline Mayotte Reid  08/08/2021   Nurse Notes:  Ms. HLasure, Thank you for taking time to come for your Medicare Wellness Visit. I appreciate your ongoing commitment to your health goals. Please review the following plan we discussed and let me know if I can assist you in the future.   These are the goals we discussed:  Goals   None     This is a list of the screening recommended for you and due dates:  Health Maintenance  Topic Date Due   Hepatitis C Screening: USPSTF Recommendation to screen - Ages 129-79yo.  Never done   COVID-19 Vaccine (5 - Booster) 11/10/2020   Hemoglobin A1C  02/04/2022   Eye  exam for diabetics  08/01/2022   Complete foot exam   08/07/2022   Mammogram  01/13/2023   Tetanus Vaccine  01/19/2024   Colon Cancer Screening  12/25/2026   Flu Shot  Completed   HIV Screening  Completed   Zoster (Shingles) Vaccine  Completed   HPV Vaccine  Aged Out

## 2021-08-09 ENCOUNTER — Other Ambulatory Visit: Payer: Self-pay | Admitting: Internal Medicine

## 2021-08-09 DIAGNOSIS — F411 Generalized anxiety disorder: Secondary | ICD-10-CM

## 2021-08-11 NOTE — Assessment & Plan Note (Signed)
BP Readings from Last 1 Encounters:  08/07/21 132/88   Well-controlled with Amlodipine, Losartan and Coreg Followed by Cardiology at [facility] for compliance with the medications Advised DASH diet and moderate exercise/walking as tolerated

## 2021-08-11 NOTE — Assessment & Plan Note (Signed)
For Multiple myeloma Followed by oncology 

## 2021-08-11 NOTE — Assessment & Plan Note (Signed)
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

## 2021-08-11 NOTE — Assessment & Plan Note (Signed)
Use erythromycin ointment as needed for left lower eyelid hordeolum Warm compresses as needed

## 2021-08-11 NOTE — Assessment & Plan Note (Signed)
On allopurinol 

## 2021-08-11 NOTE — Assessment & Plan Note (Signed)
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

## 2021-08-11 NOTE — Assessment & Plan Note (Signed)
Undergoing treatment for it, followed by Oncology On PPX antiviral and Macrobid

## 2021-08-11 NOTE — Assessment & Plan Note (Signed)
Followed by urology at [place] to maintain adequate fluid intake

## 2021-08-11 NOTE — Progress Notes (Signed)
Established Patient Office Visit  Subjective:  Patient ID: Joanna Reid, female    DOB: 1966/02/10  Age: 56 y.o. MRN: [id number]  CC:  Chief Complaint  Patient presents with   Follow-up    3 month follow up HTN and DM and Anxiety     HPI [name] is a 56 y.o. 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  HTN: BP is well-controlled. Takes medications regularly. Patient denies headache, dizziness, chest pain, dyspnea or palpitations.   She is undergoing chemotherapy for MM, and is followed by oncology.  She has seen spine specialist for nontraumatic T12-L1 fracture, and has started Celebrex, which has helped her pain.  She denies any weakness of the LE currently.  She has had left lower eyelid infection -stye recurrently since being on chemotherapy.  She requests a refill of erythromycin ointment.  DM: She developed steroid-induced hyperglycemia during treatment for MM.  Her HbA1c was 5.4 in 02/2021.  She is on metformin and Trulicity.  She sees endocrinology at [facility].  She denies any polyuria or polydipsia.  She has a history of anxiety, for which she takes Lexapro and as needed Valium.  She denies any anhedonia, SI or HI currently.    Past Medical History:  Diagnosis Date   Acid reflux    Allergic rhinitis    Cancer ([place])    multiple myeloma   Diabetes mellitus    type 2   Gout    Gout    H/o COVID-19--- was Positive 09/17/2019, Negative 12/23/19 AND also Neg on  12/26/19 12/26/2019   HBP (high blood pressure)    History of kidney stones    Migraines     Past Surgical History:  Procedure Laterality Date   BREAST CYST EXCISION Left    2009 no visible scar on skin   CESAREAN SECTION     COLONOSCOPY WITH PROPOFOL N/A 12/25/2019   Procedure: COLONOSCOPY WITH PROPOFOL;   Surgeon: Rogene Houston, MD;  [facility];  Service: Endoscopy;  Laterality: N/A;  730   EXTRACORPOREAL SHOCK WAVE LITHOTRIPSY Left 10/10/2017   Procedure: LEFT EXTRACORPOREAL SHOCK WAVE LITHOTRIPSY (ESWL);  Surgeon: Bjorn Loser, MD;  Location: [facility];  Service: Urology;  Laterality: Left;   EYE SURGERY     HEMORRHOID SURGERY N/A 11/19/2012   Procedure: HEMORRHOIDECTOMY;  Surgeon: Jamesetta So, MD;  Location: [facility];  Service: General;  Laterality: N/A;   kidney stones  1998   LAPAROSCOPIC UNILATERAL SALPINGO OOPHERECTOMY  05/14/2012   Procedure: LAPAROSCOPIC UNILATERAL SALPINGO OOPHORECTOMY;  Surgeon: [name], MD;  Location: [facility];  Service: Gynecology;  Laterality: Right;  laparoscopic right salpingo-oophorectomy   PARTIAL HYSTERECTOMY     POLYPECTOMY  12/25/2019   Procedure: POLYPECTOMY;  Surgeon: Rogene Houston, MD;  [facility];  Service: Endoscopy;;   TONSILECTOMY, ADENOIDECTOMY, BILATERAL MYRINGOTOMY AND TUBES     VESICOVAGINAL FISTULA CLOSURE W/ TAH      Family History  Problem Relation Age of Onset   Arthritis Other    Cancer Other    Diabetes Other    Hypertension Mother    Dementia Mother    Diabetes Father    ALS Father    Diabetes Brother    Hypertension Brother    Cancer Paternal Aunt    COPD  Maternal Grandmother    Cancer Maternal Grandfather    Anesthesia problems Paternal Grandfather     Social History   Socioeconomic History   Marital status: Married    Spouse name: [name]   Number of children: 1   Years of education: 12   Highest education level: Some college, no degree  Occupational History    Employer: [employer]  Tobacco Use   Smoking status: Former    Types: Cigarettes    Quit date: 02/27/1999    Years since quitting: 22.4   Smokeless tobacco: Never   Tobacco comments:    socially   Vaping Use   Vaping Use: Never used  Substance and Sexual Activity   Alcohol use: No    Alcohol/week: 0.0 standard drinks    Drug use: No   Sexual activity: Yes    Birth control/protection: Surgical    Comment: hyst  Other Topics Concern   Not on file  Social History Narrative   Not on file   Social Determinants of Health   Financial Resource Strain: Low Risk    Difficulty of Paying Living Expenses: Not hard at all  Food Insecurity: Not on file  Transportation Needs: No Transportation Needs   Lack of Transportation (Medical): No   Lack of Transportation (Non-Medical): No  Physical Activity: Unknown   Days of Exercise per Week: Not on file   Minutes of Exercise per Session: 10 min  Stress: No Stress Concern Present   Feeling of Stress : Not at all  Social Connections: Socially Integrated   Frequency of Communication with Friends and Family: More than three times a week   Frequency of Social Gatherings with Friends and Family: Twice a week   Attends Religious Services: More than 4 times per year   Active Member of [employer] or Organizations: Yes   Attends Music therapist: More than 4 times per year   Marital Status: Married  Human resources officer Violence: Not At Risk   Fear of Current or Ex-Partner: No   Emotionally Abused: No   Physically Abused: No   Sexually Abused: No    Outpatient Medications Prior to Visit  Medication Sig Dispense Refill   acyclovir (ZOVIRAX) 400 MG tablet TAKE (1) TABLET BY MOUTH TWICE DAILY. 60 tablet 3   albuterol (VENTOLIN HFA) 108 (90 Base) MCG/ACT inhaler Inhale 2 puffs into the lungs every 4 (four) hours as needed for wheezing or shortness of breath. 1 each 3   allopurinol (ZYLOPRIM) 300 MG tablet TAKE (1) TABLET BY MOUTH ONCE DAILY. 90 tablet 0   amLODipine (NORVASC) 2.5 MG tablet Take 2.5 mg by mouth daily.     aspirin EC 81 MG tablet Take 1 tablet (81 mg total) by mouth daily. 30 tablet 11   bumetanide (BUMEX) 0.5 MG tablet Take 0.5 mg by mouth daily.      carvedilol (COREG) 25 MG tablet Take 1 tablet (25 mg total) by mouth 2 (two) times daily.      diclofenac Sodium (VOLTAREN) 1 % GEL Apply 2 g topically 4 (four) times daily. 100 g 0   dicyclomine (BENTYL) 20 MG tablet Take 1 tablet (20 mg total) by mouth 2 (two) times daily as needed for spasms. 20 tablet 0   Docosanol 10 % CREA Apply 1 application topically daily as needed (for nose).      docusate sodium (COLACE) 100 MG capsule Take 100 mg by mouth daily.      escitalopram (LEXAPRO) 10 MG tablet TAKE (1)  TABLET BY MOUTH ONCE DAILY. MAY START WITH 1/2 TABLET FOR 7 DAYS. 90 tablet 0   EUFLEXXA 20 MG/2ML SOSY SMARTSIG:1 Syringe(s) I-ARTIC Once a Week     fluticasone (FLONASE) 50 MCG/ACT nasal spray INSTILL 2 SPRAYS INTO BOTH NOSTRILS DAILY 16 g 4   losartan (COZAAR) 100 MG tablet Take 100 mg by mouth daily.     magnesium oxide (MAG-OX) 400 (241.3 Mg) MG tablet Take 1 tablet (400 mg total) by mouth 3 (three) times daily. 90 tablet 3   metFORMIN (GLUCOPHAGE) 500 MG tablet TAKE 1 TABLET BY MOUTH ONCE DAILY WITH BREAKFAST (Patient taking differently: Take 1,000 mg by mouth 2 (two) times daily with a meal.) 90 tablet 0   nitrofurantoin, macrocrystal-monohydrate, (MACROBID) 100 MG capsule Take 1 capsule (100 mg total) by mouth at bedtime. 90 capsule 3   ondansetron (ZOFRAN) 8 MG tablet TAKE 1 TABLET BY MOUTH EVERY 8 HOURS AS NEEDED. 30 tablet 6   pantoprazole (PROTONIX) 40 MG tablet TAKE (1) TABLET BY MOUTH ONCE DAILY. 90 tablet 0   pregabalin (LYRICA) 200 MG capsule TAKE (1) CAPSULE BY MOUTH TWICE DAILY. 60 capsule 5   prochlorperazine (COMPAZINE) 10 MG tablet TAKE 1 TABLET BY MOUTH EVERY 6 HOURS AS NEEDED FOR NAUSEA OR VOMITING. 30 tablet 6   Propylene Glycol (SYSTANE BALANCE) 0.6 % SOLN Apply 1 drop to eye daily as needed (dry eye).      rosuvastatin (CRESTOR) 5 MG tablet Take 5 mg by mouth daily.     TRULICITY 1.5 JH/4.1DE SOPN Inject 0.75 mg into the skin every Monday.      valACYclovir (VALTREX) 1000 MG tablet TAKE 2 TABLETS BY MOUTH NOW; THEN 2 12 HOURS LATER. 12 tablet 6   VELCADE 3.5 MG  injection RECONSTITUTE EACH VIAL AS DIRECTED. INJECT 2.5 MG UNDER THE SKIN EVERY 14 DAYS. 2 each 2   diazepam (VALIUM) 5 MG tablet TAKE (1) TABLET BY MOUTH AT BEDTIME. 30 tablet 0   erythromycin ophthalmic ointment Place 1 application into the left eye at bedtime. 3.5 g 0   No facility-administered medications prior to visit.    No Known Allergies  ROS Review of Systems  Constitutional:  Negative for chills and fever.  HENT:  Negative for congestion, sinus pressure, sinus pain and sore throat.   Eyes:  Negative for pain and discharge.  Respiratory:  Negative for cough and shortness of breath.   Cardiovascular:  Negative for chest pain and palpitations.  Gastrointestinal:  Negative for abdominal pain, diarrhea, nausea and vomiting.  Endocrine: Negative for polydipsia and polyuria.  Genitourinary:  Negative for dysuria and hematuria.  Musculoskeletal:  Positive for arthralgias and back pain. Negative for neck pain and neck stiffness.  Skin:  Negative for rash.  Neurological:  Positive for numbness. Negative for dizziness and weakness.  Psychiatric/Behavioral:  Negative for agitation and behavioral problems. The patient is nervous/anxious.      Objective:    Physical Exam Vitals reviewed.  Constitutional:      General: She is not in acute distress.    Appearance: She is obese. She is not diaphoretic.  HENT:     Head: Normocephalic and atraumatic.     Nose: Nose normal.     Mouth/Throat:     Mouth: Mucous membranes are moist.  Eyes:     General: No scleral icterus.    Extraocular Movements: Extraocular movements intact.  Cardiovascular:     Rate and Rhythm: Normal rate and regular rhythm.  Pulses: Normal pulses.     Heart sounds: Normal heart sounds. No murmur heard. Pulmonary:     Breath sounds: Normal breath sounds. No wheezing or rales.  Abdominal:     Palpations: Abdomen is soft.     Tenderness: There is no abdominal tenderness.  Musculoskeletal:        General:  Tenderness (Lumbar spine area) present.     Cervical back: Neck supple. No tenderness.     Right lower leg: No edema.     Left lower leg: No edema.  Skin:    General: Skin is warm.     Findings: No rash.  Neurological:     General: No focal deficit present.     Mental Status: She is alert and oriented to person, place, and time. Mental status is at baseline.     Motor: No weakness.     Gait: Gait normal.  Psychiatric:        Mood and Affect: Mood normal.        Behavior: Behavior normal.    BP 132/88 (BP Location: Right Arm, Patient Position: Sitting, Cuff Size: Normal)    Pulse 67    Resp 18    Ht 5' 4"  (1.626 m)    Wt 268 lb 1.3 oz (121.6 kg)    SpO2 98%    BMI 46.02 kg/m  Wt Readings from Last 3 Encounters:  08/07/21 268 lb 1.3 oz (121.6 kg)  08/02/21 272 lb 3.2 oz (123.5 kg)  07/19/21 270 lb 3.2 oz (122.6 kg)    Lab Results  Component Value Date   TSH 2.628 12/08/2014   Lab Results  Component Value Date   WBC 5.1 08/02/2021   HGB 12.2 08/02/2021   HCT 37.6 08/02/2021   MCV 96.7 08/02/2021   PLT 185 08/02/2021   Lab Results  Component Value Date   NA 136 08/02/2021   K 3.9 08/02/2021   CO2 24 08/02/2021   GLUCOSE 129 (H) 08/02/2021   BUN 13 08/02/2021   CREATININE 0.94 08/02/2021   BILITOT 0.6 08/02/2021   ALKPHOS 77 08/02/2021   AST 22 08/02/2021   ALT 13 08/02/2021   PROT 7.0 08/02/2021   ALBUMIN 4.0 08/02/2021   CALCIUM 9.0 08/02/2021   ANIONGAP 10 08/02/2021   Lab Results  Component Value Date   CHOL 131 11/10/2020   Lab Results  Component Value Date   HDL 42 11/10/2020   Lab Results  Component Value Date   LDLCALC 56 11/10/2020   Lab Results  Component Value Date   TRIG 199 (H) 11/10/2020   Lab Results  Component Value Date   CHOLHDL 3.1 11/10/2020   Lab Results  Component Value Date   HGBA1C 6.1 08/07/2021   HGBA1C 6.1 08/07/2021      Assessment & Plan:   Problem List Items Addressed This Visit       Cardiovascular and  Mediastinum   Essential hypertension, benign - Primary    BP Readings from Last 1 Encounters:  08/07/21 132/88  Well-controlled with Amlodipine, Losartan and Coreg Followed by Cardiology at [facility] for compliance with the medications Advised DASH diet and moderate exercise/walking as tolerated        Endocrine   Type 2 diabetes mellitus with other specified complication ([place])    Likely steroid-induced Lab Results  Component Value Date   HGBA1C 6.1 08/07/2021   HGBA1C 6.1 08/07/2021   Followed by endocrinology at [facility] On metformin and Trulicity On statin  and ARB Advised to follow diabetic diet Diabetic eye exam: Advised to follow up with Ophthalmology for diabetic eye exam      Relevant Orders   Hemoglobin A1c   CMP14+EGFR   POCT glycosylated hemoglobin (Hb A1C) (Completed)     Musculoskeletal and Integument   Hordeolum internum of left lower eyelid    Use erythromycin ointment as needed for left lower eyelid hordeolum Warm compresses as needed      Relevant Medications   erythromycin ophthalmic ointment     Genitourinary   Recurrent nephrolithiasis    Followed by urology at [facility] to maintain adequate fluid intake        Other   Gout    On allopurinol      Multiple myeloma ([place])    Undergoing treatment for it, followed by Oncology On PPX antiviral and Macrobid      Relevant Orders   VITAMIN D 25 Hydroxy (Vit-D Deficiency, Fractures)   Morbid obesity with BMI of 45.0-49.9, adult (HCC)    Diet modification and moderate exercise as tolerated On Trulicity for DM      S/P autologous bone marrow transplantation ([place])    For Multiple myeloma Followed by oncology      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    Dispense:  3.5 g    Refill:  0    Follow-up: Return in about 6 months (around 02/04/2022) for Annual physical.    Lindell Spar, MD

## 2021-08-11 NOTE — Assessment & Plan Note (Signed)
Diet modification and moderate exercise as tolerated On Trulicity for DM

## 2021-08-14 ENCOUNTER — Encounter (HOSPITAL_COMMUNITY): Payer: Self-pay | Admitting: Hematology

## 2021-08-16 ENCOUNTER — Inpatient Hospital Stay (HOSPITAL_COMMUNITY): Payer: Medicare Other | Attending: Hematology

## 2021-08-16 ENCOUNTER — Other Ambulatory Visit: Payer: Self-pay

## 2021-08-16 ENCOUNTER — Inpatient Hospital Stay (HOSPITAL_COMMUNITY): Payer: Medicare Other

## 2021-08-16 VITALS — BP 135/95 | HR 74 | Temp 97.4°F | Resp 20 | Ht 62.5 in | Wt 267.6 lb

## 2021-08-16 DIAGNOSIS — C9000 Multiple myeloma not having achieved remission: Secondary | ICD-10-CM | POA: Diagnosis present

## 2021-08-16 DIAGNOSIS — Z79899 Other long term (current) drug therapy: Secondary | ICD-10-CM | POA: Insufficient documentation

## 2021-08-16 DIAGNOSIS — Z5112 Encounter for antineoplastic immunotherapy: Secondary | ICD-10-CM | POA: Diagnosis present

## 2021-08-16 LAB — CBC WITH DIFFERENTIAL/PLATELET
Abs Immature Granulocytes: 0.01 10*3/uL (ref 0.00–0.07)
Basophils Absolute: 0 10*3/uL (ref 0.0–0.1)
Basophils Relative: 1 %
Eosinophils Absolute: 0 10*3/uL (ref 0.0–0.5)
Eosinophils Relative: 0 %
HCT: 37.7 % (ref 36.0–46.0)
Hemoglobin: 12.6 g/dL (ref 12.0–15.0)
Immature Granulocytes: 0 %
Lymphocytes Relative: 17 %
Lymphs Abs: 1.1 10*3/uL (ref 0.7–4.0)
MCH: 32 pg (ref 26.0–34.0)
MCHC: 33.4 g/dL (ref 30.0–36.0)
MCV: 95.7 fL (ref 80.0–100.0)
Monocytes Absolute: 0.3 10*3/uL (ref 0.1–1.0)
Monocytes Relative: 4 %
Neutro Abs: 5 10*3/uL (ref 1.7–7.7)
Neutrophils Relative %: 78 %
Platelets: 167 10*3/uL (ref 150–400)
RBC: 3.94 MIL/uL (ref 3.87–5.11)
RDW: 15 % (ref 11.5–15.5)
WBC: 6.4 10*3/uL (ref 4.0–10.5)
nRBC: 0 % (ref 0.0–0.2)

## 2021-08-16 LAB — COMPREHENSIVE METABOLIC PANEL
ALT: 13 U/L (ref 0–44)
AST: 20 U/L (ref 15–41)
Albumin: 4 g/dL (ref 3.5–5.0)
Alkaline Phosphatase: 80 U/L (ref 38–126)
Anion gap: 8 (ref 5–15)
BUN: 15 mg/dL (ref 6–20)
CO2: 25 mmol/L (ref 22–32)
Calcium: 9.2 mg/dL (ref 8.9–10.3)
Chloride: 104 mmol/L (ref 98–111)
Creatinine, Ser: 0.83 mg/dL (ref 0.44–1.00)
GFR, Estimated: >60 mL/min (ref >60)
Glucose, Bld: 142 mg/dL — ABNORMAL HIGH (ref 70–99)
Potassium: 3.8 mmol/L (ref 3.5–5.1)
Sodium: 137 mmol/L (ref 135–145)
Total Bilirubin: 0.6 mg/dL (ref 0.3–1.2)
Total Protein: 7.2 g/dL (ref 6.5–8.1)

## 2021-08-16 LAB — MAGNESIUM: Magnesium: 1.9 mg/dL (ref 1.7–2.4)

## 2021-08-16 MED ORDER — BORTEZOMIB CHEMO SQ INJECTION 3.5 MG (2.5MG/ML)
1.0400 mg/m2 | Freq: Once | INTRAMUSCULAR | Status: AC
  Administered 2021-08-16: 2.5 mg via SUBCUTANEOUS
  Filled 2021-08-16: qty 1

## 2021-08-16 NOTE — Patient Instructions (Signed)
Joanna Reid  Discharge Instructions: Thank you for choosing [facility] to provide your oncology and hematology care.  If you have a lab appointment with the [facility], please come in thru the Main Entrance and check in at the main information desk.  Wear comfortable clothing and clothing appropriate for easy access to any Portacath or PICC line.   We strive to give you quality time with your provider. You may need to reschedule your appointment if you arrive late (15 or more minutes).  Arriving late affects you and other patients whose appointments are after yours.  Also, if you miss three or more appointments without notifying the office, you may be dismissed from the clinic at the providers discretion.      For prescription refill requests, have your pharmacy contact our office and allow 72 hours for refills to be completed.    Today you received the following chemotherapy Velcade injection.     BELOW ARE SYMPTOMS THAT SHOULD BE REPORTED IMMEDIATELY: *FEVER GREATER THAN 100.4 F (38 C) OR HIGHER *CHILLS OR SWEATING *NAUSEA AND VOMITING THAT IS NOT CONTROLLED WITH YOUR NAUSEA MEDICATION *UNUSUAL SHORTNESS OF BREATH *UNUSUAL BRUISING OR BLEEDING *URINARY PROBLEMS (pain or burning when urinating, or frequent urination) *BOWEL PROBLEMS (unusual diarrhea, constipation, pain near the anus) TENDERNESS IN MOUTH AND THROAT WITH OR WITHOUT PRESENCE OF ULCERS (sore throat, sores in mouth, or a toothache) UNUSUAL RASH, SWELLING OR PAIN  UNUSUAL VAGINAL DISCHARGE OR ITCHING   Items with * indicate a potential emergency and should be followed up as soon as possible or go to the Emergency Department if any problems should occur.  Please show the CHEMOTHERAPY ALERT CARD or IMMUNOTHERAPY ALERT CARD at check-in to the Emergency Department and triage nurse.  Should you have questions after your visit or need to cancel or reschedule your appointment, please contact [facility] [phone]  and follow the prompts.  Office hours are 8:00 a.m. to 4:30 p.m. Monday - Friday. Please note that voicemails left after 4:00 p.m. may not be returned until the following business day.  We are closed weekends and major holidays. You have access to a nurse at all times for urgent questions. Please call the main number to the clinic [phone] and follow the prompts.  For any non-urgent questions, you may also contact your provider using MyChart. We now offer e-Visits for anyone 3 and older to request care online for non-urgent symptoms. For details visit [email or web address].   Also download the MyChart app! Go to the app store, search "MyChart", open the app, select [facility], and log in with your MyChart username and password.  Due to Covid, a mask is required upon entering the hospital/clinic. If you do not have a mask, one will be given to you upon arrival. For doctor visits, patients may have 1 support person aged 60 or older with them. For treatment visits, patients cannot have anyone with them due to current Covid guidelines and our immunocompromised population.

## 2021-08-16 NOTE — Progress Notes (Signed)
Pt presents today for Velcade injection per provider's order. Vital signs and labs WNL for treatment today and pt voiced no new complaints at this time. Pt took compazine at home prior to arrival.  Velcade injection given today per MD orders. Tolerated infusion without adverse affects. Vital signs stable. No complaints at this time. Discharged from clinic ambulatory in stable condition. Alert and oriented x 3. F/U with [facility] as scheduled.

## 2021-08-23 ENCOUNTER — Encounter (INDEPENDENT_AMBULATORY_CARE_PROVIDER_SITE_OTHER): Payer: Self-pay

## 2021-08-23 ENCOUNTER — Inpatient Hospital Stay (HOSPITAL_COMMUNITY): Payer: Medicare Other

## 2021-08-23 DIAGNOSIS — Z5112 Encounter for antineoplastic immunotherapy: Secondary | ICD-10-CM | POA: Diagnosis not present

## 2021-08-23 DIAGNOSIS — C9000 Multiple myeloma not having achieved remission: Secondary | ICD-10-CM

## 2021-08-23 LAB — COMPREHENSIVE METABOLIC PANEL
ALT: 17 U/L (ref 0–44)
AST: 19 U/L (ref 15–41)
Albumin: 3.8 g/dL (ref 3.5–5.0)
Alkaline Phosphatase: 71 U/L (ref 38–126)
Anion gap: 12 (ref 5–15)
BUN: 22 mg/dL — ABNORMAL HIGH (ref 6–20)
CO2: 23 mmol/L (ref 22–32)
Calcium: 8.9 mg/dL (ref 8.9–10.3)
Chloride: 103 mmol/L (ref 98–111)
Creatinine, Ser: 0.91 mg/dL (ref 0.44–1.00)
GFR, Estimated: >60 mL/min (ref >60)
Glucose, Bld: 92 mg/dL (ref 70–99)
Potassium: 3.7 mmol/L (ref 3.5–5.1)
Sodium: 138 mmol/L (ref 135–145)
Total Bilirubin: 0.5 mg/dL (ref 0.3–1.2)
Total Protein: 6.8 g/dL (ref 6.5–8.1)

## 2021-08-23 LAB — CBC WITH DIFFERENTIAL/PLATELET
Abs Immature Granulocytes: 0.01 10*3/uL (ref 0.00–0.07)
Basophils Absolute: 0 10*3/uL (ref 0.0–0.1)
Basophils Relative: 1 %
Eosinophils Absolute: 0.1 10*3/uL (ref 0.0–0.5)
Eosinophils Relative: 1 %
HCT: 37.6 % (ref 36.0–46.0)
Hemoglobin: 11.9 g/dL — ABNORMAL LOW (ref 12.0–15.0)
Immature Granulocytes: 0 %
Lymphocytes Relative: 42 %
Lymphs Abs: 2 10*3/uL (ref 0.7–4.0)
MCH: 29.9 pg (ref 26.0–34.0)
MCHC: 31.6 g/dL (ref 30.0–36.0)
MCV: 94.5 fL (ref 80.0–100.0)
Monocytes Absolute: 0.6 10*3/uL (ref 0.1–1.0)
Monocytes Relative: 12 %
Neutro Abs: 2.1 10*3/uL (ref 1.7–7.7)
Neutrophils Relative %: 44 %
Platelets: 165 10*3/uL (ref 150–400)
RBC: 3.98 MIL/uL (ref 3.87–5.11)
RDW: 15.2 % (ref 11.5–15.5)
WBC: 4.8 10*3/uL (ref 4.0–10.5)
nRBC: 0 % (ref 0.0–0.2)

## 2021-08-23 LAB — MAGNESIUM: Magnesium: 2.1 mg/dL (ref 1.7–2.4)

## 2021-08-23 LAB — LACTATE DEHYDROGENASE: LDH: 158 U/L (ref 98–192)

## 2021-08-24 LAB — PROTEIN ELECTROPHORESIS, SERUM
A/G Ratio: 1.1 (ref 0.7–1.7)
Albumin ELP: 3.4 g/dL (ref 2.9–4.4)
Alpha-1-Globulin: 0.3 g/dL (ref 0.0–0.4)
Alpha-2-Globulin: 0.9 g/dL (ref 0.4–1.0)
Beta Globulin: 1.1 g/dL (ref 0.7–1.3)
Gamma Globulin: 0.8 g/dL (ref 0.4–1.8)
Globulin, Total: 3 g/dL (ref 2.2–3.9)
Total Protein ELP: 6.4 g/dL (ref 6.0–8.5)

## 2021-08-24 LAB — KAPPA/LAMBDA LIGHT CHAINS
Kappa free light chain: 26.9 mg/L — ABNORMAL HIGH (ref 3.3–19.4)
Kappa, lambda light chain ratio: 0.34 (ref 0.26–1.65)
Lambda free light chains: 78.3 mg/L — ABNORMAL HIGH (ref 5.7–26.3)

## 2021-08-28 LAB — IMMUNOFIXATION ELECTROPHORESIS
IgA: 85 mg/dL — ABNORMAL LOW (ref 87–352)
IgG (Immunoglobin G), Serum: 689 mg/dL (ref 586–1602)
IgM (Immunoglobulin M), Srm: 21 mg/dL — ABNORMAL LOW (ref 26–217)
Total Protein ELP: 6.5 g/dL (ref 6.0–8.5)

## 2021-08-30 ENCOUNTER — Inpatient Hospital Stay (HOSPITAL_BASED_OUTPATIENT_CLINIC_OR_DEPARTMENT_OTHER): Payer: Medicare Other | Admitting: Hematology

## 2021-08-30 ENCOUNTER — Inpatient Hospital Stay (HOSPITAL_COMMUNITY): Payer: Medicare Other

## 2021-08-30 ENCOUNTER — Other Ambulatory Visit (HOSPITAL_COMMUNITY): Payer: Self-pay | Admitting: Hematology

## 2021-08-30 ENCOUNTER — Other Ambulatory Visit: Payer: Self-pay

## 2021-08-30 VITALS — BP 146/98 | HR 76 | Temp 97.5°F | Resp 20 | Ht 62.21 in | Wt 267.9 lb

## 2021-08-30 DIAGNOSIS — C9000 Multiple myeloma not having achieved remission: Secondary | ICD-10-CM

## 2021-08-30 DIAGNOSIS — Z5112 Encounter for antineoplastic immunotherapy: Secondary | ICD-10-CM | POA: Diagnosis not present

## 2021-08-30 MED ORDER — BORTEZOMIB CHEMO SQ INJECTION 3.5 MG (2.5MG/ML)
1.0400 mg/m2 | Freq: Once | INTRAMUSCULAR | Status: AC
  Administered 2021-08-30: 2.5 mg via SUBCUTANEOUS
  Filled 2021-08-30: qty 1

## 2021-08-30 MED ORDER — SODIUM CHLORIDE 0.9 % IV SOLN: Freq: Once | INTRAVENOUS | Status: AC

## 2021-08-30 MED ORDER — ZOLEDRONIC ACID 4 MG/5ML IV CONC
3.0000 mg | Freq: Once | INTRAVENOUS | Status: AC
  Administered 2021-08-30: 3 mg via INTRAVENOUS
  Filled 2021-08-30: qty 3.75

## 2021-08-30 NOTE — Progress Notes (Signed)
Pt presents today for Velcade and Zometa injection per provider's order. Vital signs and labs WNL for treatment today.Pt took Compazine at home prior to arrival. Okay to proceed with treatment today per Dr.K.  Peripheral IV started with good blood return pre and post infusion.  Velcade injection and Zometa IV given today per MD orders. Tolerated infusion without adverse affects. Vital signs stable. No complaints at this time. Discharged from clinic ambulatory in stable condition. Alert and oriented x 3. F/U with [facility] as scheduled.

## 2021-08-30 NOTE — Patient Instructions (Addendum)
[place] at [facility] Discharge Instructions   You were seen and examined today by Dr. Delton Coombes.  He reviewed the results of your lab work.  The immunofixation was positive and your light chains are elevated.  Dr. Raliegh Ip discussed starting a pill called Pomalyst in addition to the Velcade injections to get this back under control.   We will proceed with Zometa and Velcade today.  We will let you know the plan of care once we consult with neurology.   Return as scheduled.     Thank you for choosing [place] at [facility] to provide your oncology and hematology care.  To afford each patient quality time with our provider, please arrive at least 15 minutes before your scheduled appointment time.   If you have a lab appointment with the [facility] please come in thru the Main Entrance and check in at the main information desk.  You need to re-schedule your appointment should you arrive 10 or more minutes late.  We strive to give you quality time with our providers, and arriving late affects you and other patients whose appointments are after yours.  Also, if you no show three or more times for appointments you may be dismissed from the clinic at the providers discretion.     Again, thank you for choosing [facility].  Our hope is that these requests will decrease the amount of time that you wait before being seen by our physicians.       _____________________________________________________________  Should you have questions after your visit to [facility], please contact our office at [phone] and follow the prompts.  Our office hours are 8:00 a.m. and 4:30 p.m. Monday - Friday.  Please note that voicemails left after 4:00 p.m. may not be returned until the following business day.  We are closed weekends and major holidays.  You do have access to a nurse 24-7, just call the main number to the clinic [phone]  and do not press any options, hold on the line and a nurse will answer the phone.    For prescription refill requests, have your pharmacy contact our office and allow 72 hours.    Due to Covid, you will need to wear a mask upon entering the hospital. If you do not have a mask, a mask will be given to you at the Main Entrance upon arrival. For doctor visits, patients may have 1 support person age 78 or older with them. For treatment visits, patients can not have anyone with them due to social distancing guidelines and our immunocompromised population.

## 2021-08-30 NOTE — Progress Notes (Signed)
Patient has been examined by Dr. Katragadda, and vital signs and labs have been reviewed. ANC, Creatinine, LFTs, hemoglobin, and platelets are within treatment parameters per M.D. - pt may proceed with treatment.    °

## 2021-08-30 NOTE — Patient Instructions (Signed)
[facility]  Discharge Instructions: Thank you for choosing [facility] to provide your oncology and hematology care.  If you have a lab appointment with the [facility], please come in thru the Main Entrance and check in at the main information desk.  Wear comfortable clothing and clothing appropriate for easy access to any Portacath or PICC line.   We strive to give you quality time with your provider. You may need to reschedule your appointment if you arrive late (15 or more minutes).  Arriving late affects you and other patients whose appointments are after yours.  Also, if you miss three or more appointments without notifying the office, you may be dismissed from the clinic at the providers discretion.      For prescription refill requests, have your pharmacy contact our office and allow 72 hours for refills to be completed.    Today you received the following chemotherapy and/or immunotherapy agents Velcade and Zometa injections.   To help prevent nausea and vomiting after your treatment, we encourage you to take your nausea medication as directed.  BELOW ARE SYMPTOMS THAT SHOULD BE REPORTED IMMEDIATELY: *FEVER GREATER THAN 100.4 F (38 C) OR HIGHER *CHILLS OR SWEATING *NAUSEA AND VOMITING THAT IS NOT CONTROLLED WITH YOUR NAUSEA MEDICATION *UNUSUAL SHORTNESS OF BREATH *UNUSUAL BRUISING OR BLEEDING *URINARY PROBLEMS (pain or burning when urinating, or frequent urination) *BOWEL PROBLEMS (unusual diarrhea, constipation, pain near the anus) TENDERNESS IN MOUTH AND THROAT WITH OR WITHOUT PRESENCE OF ULCERS (sore throat, sores in mouth, or a toothache) UNUSUAL RASH, SWELLING OR PAIN  UNUSUAL VAGINAL DISCHARGE OR ITCHING   Items with * indicate a potential emergency and should be followed up as soon as possible or go to the Emergency Department if any problems should occur.  Please show the CHEMOTHERAPY ALERT CARD or IMMUNOTHERAPY ALERT CARD at check-in to  the Emergency Department and triage nurse.  Should you have questions after your visit or need to cancel or reschedule your appointment, please contact [facility] [phone]  and follow the prompts.  Office hours are 8:00 a.m. to 4:30 p.m. Monday - Friday. Please note that voicemails left after 4:00 p.m. may not be returned until the following business day.  We are closed weekends and major holidays. You have access to a nurse at all times for urgent questions. Please call the main number to the clinic [phone] and follow the prompts.  For any non-urgent questions, you may also contact your provider using MyChart. We now offer e-Visits for anyone 51 and older to request care online for non-urgent symptoms. For details visit [email or web address].   Also download the MyChart app! Go to the app store, search "MyChart", open the app, select [facility], and log in with your MyChart username and password.  Due to Covid, a mask is required upon entering the hospital/clinic. If you do not have a mask, one will be given to you upon arrival. For doctor visits, patients may have 1 support person aged 7 or older with them. For treatment visits, patients cannot have anyone with them due to current Covid guidelines and our immunocompromised population.

## 2021-08-30 NOTE — Progress Notes (Signed)
[facility] [place], [place] [address]   CLINIC:  Medical Oncology/Hematology  PCP:  Joanna Spar, MD [address] / [place] [address] [phone]   REASON FOR VISIT:  Follow-up for multiple myeloma  PRIOR THERAPY:  1. RVD x 4 cycles from 11/01/2017 through 01/14/2018. 2. Stem cell transplant on 02/20/2018. 3. Velcade from 06/11/2018 to 12/22/2019.  NGS Results: not done  CURRENT THERAPY: Velcade every 2 weeks  BRIEF ONCOLOGIC HISTORY:  Oncology History  Multiple myeloma ([place])  10/29/2017 Initial Diagnosis   Multiple myeloma ([place])   11/01/2017 - 01/24/2018 Chemotherapy   The patient had dexamethasone (DECADRON) 4 MG tablet, 1 of 1 cycle, Start date: --, End date: -- lenalidomide (REVLIMID) 25 MG capsule, 1 of 1 cycle, Start date: --, End date: -- bortezomib SQ (VELCADE) chemo injection 3 mg, 1.3 mg/m2 = 3 mg, Subcutaneous,  Once, 5 of 5 cycles Administration: 3 mg (11/01/2017), 3 mg (11/08/2017), 3 mg (11/05/2017), 3 mg (11/22/2017), 3 mg (11/12/2017), 3 mg (11/29/2017), 3 mg (11/26/2017), 3 mg (12/03/2017), 3 mg (12/13/2017), 3 mg (12/20/2017), 3 mg (12/17/2017), 3 mg (12/24/2017), 3 mg (01/03/2018), 3 mg (01/10/2018), 3 mg (01/07/2018), 3 mg (01/14/2018), 3 mg (01/24/2018)   for chemotherapy treatment.     06/11/2018 -  Chemotherapy   Patient is on Treatment Plan : MYELOMA MAINTENANCE Bortezomib SQ q 7d x 6 weeks, two weeks off then q 14d       CANCER STAGING:  Cancer Staging  Multiple myeloma ([place]) Staging form: Plasma Cell Myeloma and Plasma Cell Disorders, AJCC 8th Edition - Clinical: No stage assigned - Unsigned - Clinical: No stage assigned - Unsigned   INTERVAL HISTORY:  Joanna Reid, a 56 y.o. female, returns for routine follow-up and consideration for next cycle of chemotherapy. Joanna Reid was last seen on 06/07/2021.  Due for cycle #80 of Velcade today.   Overall, she tells me she has been feeling pretty well. She reports fatigue and  continued difficulty reading numbers. She reports back pain. 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   Overall, she will receive her next cycle of chemo today.   REVIEW OF SYSTEMS:  Review of Systems  Constitutional:  Negative for appetite change and fatigue.  Respiratory:  Positive for cough.   Musculoskeletal:  Positive for arthralgias (7/10 R knee) and back pain.  Neurological:  Positive for numbness.  Psychiatric/Behavioral:  Positive for confusion.   All other systems reviewed and are negative.  PAST MEDICAL/SURGICAL HISTORY:  Past Medical History:  Diagnosis Date   Acid reflux    Allergic rhinitis    Cancer ([place])    multiple myeloma   Diabetes mellitus    type 2   Gout    Gout    H/o COVID-19--- was Positive 09/17/2019, Negative 12/23/19 AND also Neg on  12/26/19 12/26/2019   HBP (high blood pressure)    History of kidney stones    Migraines    Past Surgical History:  Procedure Laterality Date   BREAST CYST EXCISION Left    2009 no visible scar on skin   CESAREAN SECTION     COLONOSCOPY WITH PROPOFOL N/A 12/25/2019   Procedure: COLONOSCOPY WITH PROPOFOL;  Surgeon: Joanna Houston, MD;  [facility]: [facility];  Service: Endoscopy;  Laterality: N/A;  730   EXTRACORPOREAL SHOCK WAVE LITHOTRIPSY Left 10/10/2017   Procedure: LEFT EXTRACORPOREAL SHOCK WAVE LITHOTRIPSY (ESWL);  Surgeon: Joanna Loser, MD;  Location: [facility];  Service: Urology;  Laterality: Left;   EYE SURGERY     HEMORRHOID SURGERY N/A 11/19/2012   Procedure: HEMORRHOIDECTOMY;  Surgeon: Joanna So, MD;  Location: [facility];  Service: General;  Laterality: N/A;   kidney stones  1998   LAPAROSCOPIC UNILATERAL SALPINGO OOPHERECTOMY  05/14/2012   Procedure: LAPAROSCOPIC UNILATERAL SALPINGO OOPHORECTOMY;  Surgeon: Joanna Buff, MD;  Location: [facility];  Service: Gynecology;  Laterality: Right;  laparoscopic right salpingo-oophorectomy   PARTIAL HYSTERECTOMY      POLYPECTOMY  12/25/2019   Procedure: POLYPECTOMY;  Surgeon: Joanna Houston, MD;  Location: [facility];  Service: Endoscopy;;   TONSILECTOMY, ADENOIDECTOMY, BILATERAL MYRINGOTOMY AND TUBES     VESICOVAGINAL FISTULA CLOSURE W/ TAH      SOCIAL HISTORY:  Social History   Socioeconomic History   Marital status: Married    Spouse name: Joanna Reid   Number of children: 1   Years of education: 12   Highest education level: Some college, no degree  Occupational History    Employer: [employer]  Tobacco Use   Smoking status: Former    Types: Cigarettes    Quit date: 02/27/1999    Years since quitting: 22.5   Smokeless tobacco: Never   Tobacco comments:    socially   Vaping Use   Vaping Use: Never used  Substance and Sexual Activity   Alcohol use: No    Alcohol/week: 0.0 standard drinks   Drug use: No   Sexual activity: Yes    Birth control/protection: Surgical    Comment: hyst  Other Topics Concern   Not on file  Social History Narrative   Not on file   Social Determinants of Health   Financial Resource Strain: Low Risk    Difficulty of Paying Living Expenses: Not hard at all  Food Insecurity: Not on file  Transportation Needs: No Transportation Needs   Lack of Transportation (Medical): No   Lack of Transportation (Non-Medical): No  Physical Activity: Unknown   Days of Exercise per Week: Not on file   Minutes of Exercise per Session: 10 min  Stress: No Stress Concern Present   Feeling of Stress : Not at all  Social Connections: Socially Integrated   Frequency of Communication with Friends and Family: More than three times a week   Frequency of Social Gatherings with Friends and Family: Twice a week   Attends Religious Services: More than 4 times per year   Active Member of [employer] or Organizations: Yes   Attends Music therapist: More than 4 times per year   Marital Status: Married  Human resources officer Violence: Not At Risk   Fear of Current or Ex-Partner: No    Emotionally Abused: No   Physically Abused: No   Sexually Abused: No    FAMILY HISTORY:  Family History  Problem Relation Age of Onset   Arthritis Other    Cancer Other    Diabetes Other    Hypertension Mother    Dementia Mother    Diabetes Father    ALS Father    Diabetes Brother    Hypertension Brother    Cancer Paternal Aunt    COPD Maternal Grandmother    Cancer Maternal Grandfather    Anesthesia problems Paternal Grandfather     CURRENT MEDICATIONS:  Current Outpatient Medications  Medication Sig Dispense Refill   acyclovir (ZOVIRAX) 400 MG tablet TAKE (1) TABLET BY MOUTH TWICE DAILY. 60 tablet 3   albuterol (VENTOLIN HFA) 108 (  90 Base) MCG/ACT inhaler Inhale 2 puffs into the lungs every 4 (four) hours as needed for wheezing or shortness of breath. 1 each 3   allopurinol (ZYLOPRIM) 300 MG tablet TAKE (1) TABLET BY MOUTH ONCE DAILY. 90 tablet 0   amLODipine (NORVASC) 2.5 MG tablet Take 2.5 mg by mouth daily.     aspirin EC 81 MG tablet Take 1 tablet (81 mg total) by mouth daily. 30 tablet 11   bumetanide (BUMEX) 0.5 MG tablet Take 0.5 mg by mouth daily.      carvedilol (COREG) 25 MG tablet Take 1 tablet (25 mg total) by mouth 2 (two) times daily.     diazepam (VALIUM) 5 MG tablet TAKE (1) TABLET BY MOUTH AT BEDTIME. 30 tablet 0   diclofenac Sodium (VOLTAREN) 1 % GEL Apply 2 g topically 4 (four) times daily. 100 g 0   dicyclomine (BENTYL) 20 MG tablet Take 1 tablet (20 mg total) by mouth 2 (two) times daily as needed for spasms. 20 tablet 0   Docosanol 10 % CREA Apply 1 application topically daily as needed (for nose).      docusate sodium (COLACE) 100 MG capsule Take 100 mg by mouth daily.      erythromycin ophthalmic ointment Place 1 application into the left eye at bedtime. 3.5 g 0   escitalopram (LEXAPRO) 10 MG tablet TAKE (1) TABLET BY MOUTH ONCE DAILY. MAY START WITH 1/2 TABLET FOR 7 DAYS. 90 tablet 0   EUFLEXXA 20 MG/2ML SOSY SMARTSIG:1 Syringe(s) I-ARTIC Once a Week      fluticasone (FLONASE) 50 MCG/ACT nasal spray INSTILL 2 SPRAYS INTO BOTH NOSTRILS DAILY 16 g 4   losartan (COZAAR) 100 MG tablet Take 100 mg by mouth daily.     magnesium oxide (MAG-OX) 400 (241.3 Mg) MG tablet Take 1 tablet (400 mg total) by mouth 3 (three) times daily. 90 tablet 3   metFORMIN (GLUCOPHAGE) 500 MG tablet TAKE 1 TABLET BY MOUTH ONCE DAILY WITH BREAKFAST (Patient taking differently: Take 1,000 mg by mouth 2 (two) times daily with a meal.) 90 tablet 0   pantoprazole (PROTONIX) 40 MG tablet TAKE (1) TABLET BY MOUTH ONCE DAILY. 90 tablet 0   pregabalin (LYRICA) 200 MG capsule TAKE (1) CAPSULE BY MOUTH TWICE DAILY. 60 capsule 5   Propylene Glycol (SYSTANE BALANCE) 0.6 % SOLN Apply 1 drop to eye daily as needed (dry eye).      rosuvastatin (CRESTOR) 5 MG tablet Take 5 mg by mouth daily.     TRULICITY 1.5 HE/1.7EY SOPN Inject 0.75 mg into the skin every Monday.      valACYclovir (VALTREX) 1000 MG tablet TAKE 2 TABLETS BY MOUTH NOW; THEN 2 12 HOURS LATER. 12 tablet 6   VELCADE 3.5 MG injection RECONSTITUTE EACH VIAL AS DIRECTED. INJECT 2.5 MG UNDER THE SKIN EVERY 14 DAYS. 2 each 2   nitrofurantoin, macrocrystal-monohydrate, (MACROBID) 100 MG capsule TAKE (1) CAPSULE BY MOUTH AT BEDTIME. (Patient not taking: Reported on 08/30/2021) 90 capsule 0   ondansetron (ZOFRAN) 8 MG tablet TAKE 1 TABLET BY MOUTH EVERY 8 HOURS AS NEEDED. (Patient not taking: Reported on 08/30/2021) 30 tablet 6   prochlorperazine (COMPAZINE) 10 MG tablet TAKE 1 TABLET BY MOUTH EVERY 6 HOURS AS NEEDED FOR NAUSEA OR VOMITING. (Patient not taking: Reported on 08/30/2021) 30 tablet 6   No current facility-administered medications for this visit.    ALLERGIES:  No Known Allergies  PHYSICAL EXAM:  Performance status (ECOG): 1 - Symptomatic but completely  ambulatory  There were no vitals filed for this visit. Wt Readings from Last 3 Encounters:  08/30/21 267 lb 13.7 oz (121.5 kg)  08/16/21 267 lb 10.2 oz (121.4 kg)   08/07/21 268 lb 1.3 oz (121.6 kg)   Physical Exam Vitals reviewed.  Constitutional:      Appearance: Normal appearance.  Cardiovascular:     Rate and Rhythm: Normal rate and regular rhythm.     Pulses: Normal pulses.     Heart sounds: Normal heart sounds.  Pulmonary:     Effort: Pulmonary effort is normal.     Breath sounds: Normal breath sounds.  Neurological:     General: No focal deficit present.     Mental Status: She is alert and oriented to person, place, and time.  Psychiatric:        Mood and Affect: Mood normal.        Behavior: Behavior normal.    LABORATORY DATA:  I have reviewed the labs as listed.  CBC Latest Ref Rng & Units 08/23/2021 08/16/2021 08/02/2021  WBC 4.0 - 10.5 K/uL 4.8 6.4 5.1  Hemoglobin 12.0 - 15.0 g/dL 11.9(L) 12.6 12.2  Hematocrit 36.0 - 46.0 % 37.6 37.7 37.6  Platelets 150 - 400 K/uL 165 167 185   CMP Latest Ref Rng & Units 08/23/2021 08/16/2021 08/02/2021  Glucose 70 - 99 mg/dL 92 142(H) 129(H)  BUN 6 - 20 mg/dL 22(H) 15 13  Creatinine 0.44 - 1.00 mg/dL 0.91 0.83 0.94  Sodium 135 - 145 mmol/L 138 137 136  Potassium 3.5 - 5.1 mmol/L 3.7 3.8 3.9  Chloride 98 - 111 mmol/L 103 104 102  CO2 22 - 32 mmol/L 23 25 24   Calcium 8.9 - 10.3 mg/dL 8.9 9.2 9.0  Total Protein 6.5 - 8.1 g/dL 6.8 7.2 7.0  Total Bilirubin 0.3 - 1.2 mg/dL 0.5 0.6 0.6  Alkaline Phos 38 - 126 U/L 71 80 77  AST 15 - 41 U/L 19 20 22   ALT 0 - 44 U/L 17 13 13     DIAGNOSTIC IMAGING:  I have independently reviewed the scans and discussed with the patient. No results found.   ASSESSMENT:  1.  IgG lambda multiple myeloma, stage III, del 17 p: -4 cycles of RVD from 11/01/2017 through 01/14/2018. -Stem cell transplant on 02/20/2018 at [facility] -Maintenance Velcade every 2 weeks and Revlimid 10 mg 3 weeks on/1 week off started on 07/29/2018. -Myeloma panel from 12/08/2018 shows M spike not observed.  Kappa light chains of 32.1 with ratio of 1.64.  Immunofixation was negative. 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  MRI of the brain on 12/27/2019 shows acute ischemic nonhemorrhagic left MCA territory infarct involving left parietal lobe, corresponding with perfusion deficit on CT scan angiogram.  No associated hemorrhage or mass-effect.  Additional few scattered punctate acute ischemic nonhemorrhagic infarcts involving bilateral frontal and parietal lobes as well as left cerebellum. - Revlimid was on hold since June 2021 due to potential for contributing to CVA. - Velcade dose was reduced to 1 mg per metered square on 12/28/2020 due to worsening neuropathy in the feet.   2.  CVA with aphasia: -We held her myeloma treatments since CVA with aphasia on 12/26/2019. -Her aphasia is improving.   PLAN:  1.  IgG lambda multiple myeloma, stage III, del 17 p: - Velcade dose reduced to 1 mg per metered squared on 12/14/2020 secondary to worsening neuropathy. - Reviewed myeloma labs from 08/23/2021.  Lambda light chains increased to 78.  Ratio is 0.3.  Immunofixation became positive for IgG lambda for the first time.  M spike is still undetectable.  Creatinine and calcium are normal. - I have discussed with her and her husband in detail about her myeloma starting to relapse. - We discussed about adding pomalidomide to Velcade and continue as a maintenance regimen versus changing her to a triple drug regimen containing Velcade, daratumumab and dexamethasone. - As it is a minimal increase, I think adding pomalidomide is okay.  However she had ischemic stroke in June 2021 and her Revlimid was on hold since then.  We are not sure if Revlimid really contributed to it but it is reported as one of the side effects. - We will reach out to our neurology colleagues and seek their opinion regarding adding any additional antiplatelet drug will prevent recurrence of stroke. - Today she will proceed with her Velcade.  If we start pomalidomide, I will start at low-dose of 3 mg 3 weeks on/1 week off.  We reviewed side  effects.   2.  Hypomagnesemia: - Continue magnesium 4 mg 3 times daily.  Magnesium today is 2.1.   3.  Bone strengthening agents: - Continue Zometa every 12 weeks.  Calcium today is 8.9.   4.  Peripheral neuropathy: - Continue Lyrica 200 mg twice daily.   5.  ID prophylaxis: - Continue acyclovir twice daily.   6.  Diabetes: - Continue metformin daily and Trulicity weekly.   7.  Recurrent UTIs: - Continue Macrobid 100 mg daily for prophylaxis.   Orders placed this encounter:  No orders of the defined types were placed in this encounter.    Derek Jack, MD [facility] [phone]   I, Thana Ates, am acting as a scribe for Dr. Derek Jack.  I, Derek Jack MD, have reviewed the above documentation for accuracy and completeness, and I agree with the above.

## 2021-09-08 ENCOUNTER — Telehealth (HOSPITAL_COMMUNITY): Payer: Self-pay | Admitting: Pharmacy Technician

## 2021-09-08 ENCOUNTER — Other Ambulatory Visit (HOSPITAL_COMMUNITY): Payer: Self-pay

## 2021-09-08 ENCOUNTER — Encounter (HOSPITAL_COMMUNITY): Payer: Self-pay | Admitting: Hematology

## 2021-09-08 ENCOUNTER — Telehealth (HOSPITAL_COMMUNITY): Payer: Self-pay | Admitting: Pharmacist

## 2021-09-08 MED ORDER — POMALIDOMIDE 2 MG PO CAPS: 2.0000 mg | ORAL_CAPSULE | Freq: Every day | ORAL | 0 refills | Status: DC

## 2021-09-08 NOTE — Telephone Encounter (Signed)
Oral Oncology Patient Advocate Encounter ?  ?Received notification from [facility] that prior authorization for Pomalyst is required. ?  ?PA submitted on CoverMyMeds ?Key [id number] ?Status is pending ?  ?Oral Oncology Clinic will continue to follow. ? ?Dennison Nancy CPHT ?Specialty Pharmacy Patient Advocate ?[place] ?Phone [phone] ?Fax [phone] ?09/08/2021 4:23 PM ? ? ?

## 2021-09-08 NOTE — Telephone Encounter (Signed)
Oral Oncology Pharmacist Encounter  Received new prescription for Pomalyst (pomalidomide) for the treatment of IgG lambda multiple myeloma in conjunction with bortezomib, planned duration until disease control or unacceptable drug toxicity.  CMP/CBC from 08/23/21 assessed, no relevant lab abnormalities. Prescription dose and frequency assessed.   Current medication list in Epic reviewed, no relevant DDIs with pomalidomide identified,   Evaluated chart and no patient barriers to medication adherence identified.   Prescription has been e-scribed to [facility] for benefits analysis and approval.  Oral Oncology Clinic will continue to follow for insurance authorization, copayment issues, initial counseling and start date.   Darl Pikes, PharmD, BCPS, BCOP, CPP Hematology/Oncology Clinical Pharmacist Practitioner [place]/[facility] [phone]  09/08/2021 12:58 PM

## 2021-09-08 NOTE — Telephone Encounter (Signed)
Prescription for Pomalyst 2mg  to be taken for 21 days on, 7 days off sent to [facility] per Dr. Claudina Lick ?

## 2021-09-08 NOTE — Telephone Encounter (Signed)
Oral Oncology Patient Advocate Encounter ? ?Prior Authorization for Pomalyst has been approved.   ? ?Effective dates: 09/08/21 through 09/08/22 ? ?Patient must use [facility] per insurance. ? ?Oral Oncology Clinic will continue to follow.  ? ?Dennison Nancy CPHT ?Specialty Pharmacy Patient Advocate ?[place] ?Phone [phone] ?Fax [phone] ?09/11/2021 8:44 AM ? ?

## 2021-09-11 ENCOUNTER — Other Ambulatory Visit (HOSPITAL_COMMUNITY): Payer: Self-pay

## 2021-09-13 ENCOUNTER — Inpatient Hospital Stay (HOSPITAL_COMMUNITY): Payer: BC Managed Care – PPO | Attending: Hematology

## 2021-09-13 ENCOUNTER — Encounter (HOSPITAL_COMMUNITY): Payer: Self-pay

## 2021-09-13 ENCOUNTER — Other Ambulatory Visit: Payer: Self-pay

## 2021-09-13 ENCOUNTER — Other Ambulatory Visit (HOSPITAL_COMMUNITY): Payer: Self-pay

## 2021-09-13 ENCOUNTER — Inpatient Hospital Stay (HOSPITAL_COMMUNITY): Payer: BC Managed Care – PPO

## 2021-09-13 VITALS — BP 132/94 | HR 83 | Temp 96.8°F | Resp 19 | Ht 62.0 in | Wt 271.5 lb

## 2021-09-13 DIAGNOSIS — C9000 Multiple myeloma not having achieved remission: Secondary | ICD-10-CM | POA: Diagnosis present

## 2021-09-13 DIAGNOSIS — Z5112 Encounter for antineoplastic immunotherapy: Secondary | ICD-10-CM | POA: Insufficient documentation

## 2021-09-13 DIAGNOSIS — Z79899 Other long term (current) drug therapy: Secondary | ICD-10-CM | POA: Diagnosis not present

## 2021-09-13 LAB — CBC WITH DIFFERENTIAL/PLATELET
Abs Immature Granulocytes: 0.04 10*3/uL (ref 0.00–0.07)
Basophils Absolute: 0 10*3/uL (ref 0.0–0.1)
Basophils Relative: 0 %
Eosinophils Absolute: 0 10*3/uL (ref 0.0–0.5)
Eosinophils Relative: 1 %
HCT: 35.5 % — ABNORMAL LOW (ref 36.0–46.0)
Hemoglobin: 11.5 g/dL — ABNORMAL LOW (ref 12.0–15.0)
Immature Granulocytes: 1 %
Lymphocytes Relative: 34 %
Lymphs Abs: 1.7 10*3/uL (ref 0.7–4.0)
MCH: 30.3 pg (ref 26.0–34.0)
MCHC: 32.4 g/dL (ref 30.0–36.0)
MCV: 93.4 fL (ref 80.0–100.0)
Monocytes Absolute: 0.6 10*3/uL (ref 0.1–1.0)
Monocytes Relative: 12 %
Neutro Abs: 2.6 10*3/uL (ref 1.7–7.7)
Neutrophils Relative %: 52 %
Platelets: 178 10*3/uL (ref 150–400)
RBC: 3.8 MIL/uL — ABNORMAL LOW (ref 3.87–5.11)
RDW: 15.1 % (ref 11.5–15.5)
WBC: 5 10*3/uL (ref 4.0–10.5)
nRBC: 0 % (ref 0.0–0.2)

## 2021-09-13 LAB — COMPREHENSIVE METABOLIC PANEL
ALT: 14 U/L (ref 0–44)
AST: 20 U/L (ref 15–41)
Albumin: 3.7 g/dL (ref 3.5–5.0)
Alkaline Phosphatase: 71 U/L (ref 38–126)
Anion gap: 9 (ref 5–15)
BUN: 21 mg/dL — ABNORMAL HIGH (ref 6–20)
CO2: 24 mmol/L (ref 22–32)
Calcium: 9.4 mg/dL (ref 8.9–10.3)
Chloride: 106 mmol/L (ref 98–111)
Creatinine, Ser: 1.03 mg/dL — ABNORMAL HIGH (ref 0.44–1.00)
GFR, Estimated: >60 mL/min (ref >60)
Glucose, Bld: 121 mg/dL — ABNORMAL HIGH (ref 70–99)
Potassium: 3.5 mmol/L (ref 3.5–5.1)
Sodium: 139 mmol/L (ref 135–145)
Total Bilirubin: 0.5 mg/dL (ref 0.3–1.2)
Total Protein: 6.7 g/dL (ref 6.5–8.1)

## 2021-09-13 LAB — MAGNESIUM: Magnesium: 1.8 mg/dL (ref 1.7–2.4)

## 2021-09-13 MED ORDER — BORTEZOMIB CHEMO SQ INJECTION 3.5 MG (2.5MG/ML)
1.0400 mg/m2 | Freq: Once | INTRAMUSCULAR | Status: AC
  Administered 2021-09-13: 2.5 mg via SUBCUTANEOUS
  Filled 2021-09-13: qty 1

## 2021-09-13 NOTE — Patient Instructions (Signed)
Joanna Reid  Discharge Instructions: ?Thank you for choosing [facility] to provide your oncology and hematology care.  ?If you have a lab appointment with the [facility], please come in thru the Main Entrance and check in at the main information desk. ? ?Wear comfortable clothing and clothing appropriate for easy access to any Portacath or PICC line.  ? ?We strive to give you quality time with your provider. You may need to reschedule your appointment if you arrive late (15 or more minutes).  Arriving late affects you and other patients whose appointments are after yours.  Also, if you miss three or more appointments without notifying the office, you may be dismissed from the clinic at the provider?s discretion.    ?  ?For prescription refill requests, have your pharmacy contact our office and allow 72 hours for refills to be completed.   ? ?Today you received the following chemotherapy and/or immunotherapy agents Velcade injection.     ?  ?To help prevent nausea and vomiting after your treatment, we encourage you to take your nausea medication as directed. ? ?BELOW ARE SYMPTOMS THAT SHOULD BE REPORTED IMMEDIATELY: ?*FEVER GREATER THAN 100.4 F (38 ?C) OR HIGHER ?*CHILLS OR SWEATING ?*NAUSEA AND VOMITING THAT IS NOT CONTROLLED WITH YOUR NAUSEA MEDICATION ?*UNUSUAL SHORTNESS OF BREATH ?*UNUSUAL BRUISING OR BLEEDING ?*URINARY PROBLEMS (pain or burning when urinating, or frequent urination) ?*BOWEL PROBLEMS (unusual diarrhea, constipation, pain near the anus) ?TENDERNESS IN MOUTH AND THROAT WITH OR WITHOUT PRESENCE OF ULCERS (sore throat, sores in mouth, or a toothache) ?UNUSUAL RASH, SWELLING OR PAIN  ?UNUSUAL VAGINAL DISCHARGE OR ITCHING  ? ?Items with * indicate a potential emergency and should be followed up as soon as possible or go to the Emergency Department if any problems should occur. ? ?Please show the CHEMOTHERAPY ALERT CARD or IMMUNOTHERAPY ALERT CARD at check-in to the  Emergency Department and triage nurse. ? ?Should you have questions after your visit or need to cancel or reschedule your appointment, please contact [facility] [phone]  and follow the prompts.  Office hours are 8:00 a.m. to 4:30 p.m. Monday - Friday. Please note that voicemails left after 4:00 p.m. may not be returned until the following business day.  We are closed weekends and major holidays. You have access to a nurse at all times for urgent questions. Please call the main number to the clinic [phone] and follow the prompts. ? ?For any non-urgent questions, you may also contact your provider using MyChart. We now offer e-Visits for anyone 61 and older to request care online for non-urgent symptoms. For details visit [email or web address]. ?  ?Also download the MyChart app! Go to the app store, search "MyChart", open the app, select [facility], and log in with your MyChart username and password. ? ?Due to Covid, a mask is required upon entering the hospital/clinic. If you do not have a mask, one will be given to you upon arrival. For doctor visits, patients may have 1 support person aged 39 or older with them. For treatment visits, patients cannot have anyone with them due to current Covid guidelines and our immunocompromised population.  ?

## 2021-09-13 NOTE — Progress Notes (Signed)
Patient presents today for Velcade injection. Patient has no complaints today. Blood pressure diastolic 94. Patient has a fracture in her right foot that is new since her last visit. Patient DID NOT fall but states she hit her foot in her home while walking in the dark on a table. Patient rates her pain a 3/10 . Labs within parameters for treatment. MAR reviewed and updated.  ? ?Patient has not started Pomalyst yet due to pending authorization .  ? ?Velcade given today per MD orders. Tolerated without adverse affects. Vital signs stable. No complaints at this time. Discharged from clinic ambulatory in stable condition. Alert and oriented x 3. F/U with [facility] as scheduled.   ?

## 2021-09-20 ENCOUNTER — Telehealth (HOSPITAL_COMMUNITY): Payer: Self-pay | Admitting: *Deleted

## 2021-09-20 NOTE — Telephone Encounter (Signed)
[facility] contacted to arrange delivery of Velcade for upcoming appointment on 09/27/21.  Confirmed that 2 vials will be delivered Monday 09/25/21 c/o Madelaine Bhat.   ?

## 2021-09-27 ENCOUNTER — Other Ambulatory Visit (HOSPITAL_COMMUNITY): Payer: Self-pay | Admitting: *Deleted

## 2021-09-27 ENCOUNTER — Inpatient Hospital Stay (HOSPITAL_COMMUNITY): Payer: BC Managed Care – PPO

## 2021-09-27 ENCOUNTER — Other Ambulatory Visit: Payer: Self-pay

## 2021-09-27 VITALS — BP 141/87 | HR 72 | Temp 97.7°F | Resp 20 | Ht 62.0 in | Wt 274.0 lb

## 2021-09-27 DIAGNOSIS — C9000 Multiple myeloma not having achieved remission: Secondary | ICD-10-CM

## 2021-09-27 DIAGNOSIS — Z5112 Encounter for antineoplastic immunotherapy: Secondary | ICD-10-CM | POA: Diagnosis not present

## 2021-09-27 LAB — CBC WITH DIFFERENTIAL/PLATELET
Abs Immature Granulocytes: 0.05 10*3/uL (ref 0.00–0.07)
Basophils Absolute: 0 10*3/uL (ref 0.0–0.1)
Basophils Relative: 0 %
Eosinophils Absolute: 0.1 10*3/uL (ref 0.0–0.5)
Eosinophils Relative: 1 %
HCT: 33.7 % — ABNORMAL LOW (ref 36.0–46.0)
Hemoglobin: 10.9 g/dL — ABNORMAL LOW (ref 12.0–15.0)
Immature Granulocytes: 1 %
Lymphocytes Relative: 21 %
Lymphs Abs: 1.2 10*3/uL (ref 0.7–4.0)
MCH: 30.7 pg (ref 26.0–34.0)
MCHC: 32.3 g/dL (ref 30.0–36.0)
MCV: 94.9 fL (ref 80.0–100.0)
Monocytes Absolute: 0.8 10*3/uL (ref 0.1–1.0)
Monocytes Relative: 14 %
Neutro Abs: 3.4 10*3/uL (ref 1.7–7.7)
Neutrophils Relative %: 63 %
Platelets: 166 10*3/uL (ref 150–400)
RBC: 3.55 MIL/uL — ABNORMAL LOW (ref 3.87–5.11)
RDW: 15 % (ref 11.5–15.5)
WBC: 5.5 10*3/uL (ref 4.0–10.5)
nRBC: 0 % (ref 0.0–0.2)

## 2021-09-27 LAB — COMPREHENSIVE METABOLIC PANEL
ALT: 12 U/L (ref 0–44)
AST: 18 U/L (ref 15–41)
Albumin: 3.5 g/dL (ref 3.5–5.0)
Alkaline Phosphatase: 74 U/L (ref 38–126)
Anion gap: 10 (ref 5–15)
BUN: 15 mg/dL (ref 6–20)
CO2: 23 mmol/L (ref 22–32)
Calcium: 9 mg/dL (ref 8.9–10.3)
Chloride: 105 mmol/L (ref 98–111)
Creatinine, Ser: 0.95 mg/dL (ref 0.44–1.00)
GFR, Estimated: >60 mL/min (ref >60)
Glucose, Bld: 173 mg/dL — ABNORMAL HIGH (ref 70–99)
Potassium: 3.7 mmol/L (ref 3.5–5.1)
Sodium: 138 mmol/L (ref 135–145)
Total Bilirubin: 0.6 mg/dL (ref 0.3–1.2)
Total Protein: 6.6 g/dL (ref 6.5–8.1)

## 2021-09-27 LAB — MAGNESIUM: Magnesium: 1.8 mg/dL (ref 1.7–2.4)

## 2021-09-27 MED ORDER — LACTULOSE 20 GM/30ML PO SOLN: 30.0000 mL | Freq: Every day | ORAL | 1 refills | Status: DC

## 2021-09-27 MED ORDER — BORTEZOMIB CHEMO SQ INJECTION 3.5 MG (2.5MG/ML)
1.0400 mg/m2 | Freq: Once | INTRAMUSCULAR | Status: AC
  Administered 2021-09-27: 2.5 mg via SUBCUTANEOUS
  Filled 2021-09-27: qty 1

## 2021-09-27 NOTE — Patient Instructions (Signed)
[facility]  Discharge Instructions: ?Thank you for choosing [facility] to provide your oncology and hematology care.  ?If you have a lab appointment with the [facility], please come in thru the Main Entrance and check in at the main information desk. ? ?Wear comfortable clothing and clothing appropriate for easy access to any Portacath or PICC line.  ? ?We strive to give you quality time with your provider. You may need to reschedule your appointment if you arrive late (15 or more minutes).  Arriving late affects you and other patients whose appointments are after yours.  Also, if you miss three or more appointments without notifying the office, you may be dismissed from the clinic at the provider?s discretion.    ?  ?For prescription refill requests, have your pharmacy contact our office and allow 72 hours for refills to be completed.   ? ?Today you received the following chemotherapy and/or immunotherapy agents Velcade    ?  ?To help prevent nausea and vomiting after your treatment, we encourage you to take your nausea medication as directed. ? ?BELOW ARE SYMPTOMS THAT SHOULD BE REPORTED IMMEDIATELY: ?*FEVER GREATER THAN 100.4 F (38 ?C) OR HIGHER ?*CHILLS OR SWEATING ?*NAUSEA AND VOMITING THAT IS NOT CONTROLLED WITH YOUR NAUSEA MEDICATION ?*UNUSUAL SHORTNESS OF BREATH ?*UNUSUAL BRUISING OR BLEEDING ?*URINARY PROBLEMS (pain or burning when urinating, or frequent urination) ?*BOWEL PROBLEMS (unusual diarrhea, constipation, pain near the anus) ?TENDERNESS IN MOUTH AND THROAT WITH OR WITHOUT PRESENCE OF ULCERS (sore throat, sores in mouth, or a toothache) ?UNUSUAL RASH, SWELLING OR PAIN  ?UNUSUAL VAGINAL DISCHARGE OR ITCHING  ? ?Items with * indicate a potential emergency and should be followed up as soon as possible or go to the Emergency Department if any problems should occur. ? ?Please show the CHEMOTHERAPY ALERT CARD or IMMUNOTHERAPY ALERT CARD at check-in to the Emergency  Department and triage nurse. ? ?Should you have questions after your visit or need to cancel or reschedule your appointment, please contact [facility] [phone]  and follow the prompts.  Office hours are 8:00 a.m. to 4:30 p.m. Monday - Friday. Please note that voicemails left after 4:00 p.m. may not be returned until the following business day.  We are closed weekends and major holidays. You have access to a nurse at all times for urgent questions. Please call the main number to the clinic [phone] and follow the prompts. ? ?For any non-urgent questions, you may also contact your provider using MyChart. We now offer e-Visits for anyone 8 and older to request care online for non-urgent symptoms. For details visit [email or web address]. ?  ?Also download the MyChart app! Go to the app store, search "MyChart", open the app, select [facility], and log in with your MyChart username and password. ? ?Due to Covid, a mask is required upon entering the hospital/clinic. If you do not have a mask, one will be given to you upon arrival. For doctor visits, patients may have 1 support person aged 34 or older with them. For treatment visits, patients cannot have anyone with them due to current Covid guidelines and our immunocompromised population.  ?

## 2021-09-27 NOTE — Progress Notes (Signed)
Patient presents today for Velcade injection per providers order.  Vital signs within parameters for treatment.  Labs pending.  Patient states that she has been nauseated and constipated since she started taking Pomalyst, she has been taking nausea medication and stool softeners with no relief.  Dr. Delton Coombes notified. ? ?Labs within parameters for treatment. ? ?Lactulose and Compazine called into patients pharmacy.  Patient aware. ? ?Stable during administration without incident; injection site WNL; see MAR for injection details.  Patient tolerated procedure well and without incident.  No questions or complaints noted at this time. Discharge from clinic ambulatory in stable condition.  Alert and oriented X 3.  Follow up with [facility] as scheduled.  ? ? ?

## 2021-10-06 ENCOUNTER — Other Ambulatory Visit (HOSPITAL_COMMUNITY): Payer: Self-pay

## 2021-10-06 MED ORDER — POMALIDOMIDE 2 MG PO CAPS: 2.0000 mg | ORAL_CAPSULE | Freq: Every day | ORAL | 0 refills | Status: DC

## 2021-10-06 NOTE — Telephone Encounter (Signed)
Chart reviewed. Pomalyst refilled per last office note with Dr. Katragadda.  

## 2021-10-11 ENCOUNTER — Inpatient Hospital Stay (HOSPITAL_COMMUNITY): Payer: BC Managed Care – PPO

## 2021-10-11 ENCOUNTER — Telehealth (HOSPITAL_COMMUNITY): Payer: Self-pay | Admitting: *Deleted

## 2021-10-11 ENCOUNTER — Inpatient Hospital Stay (HOSPITAL_COMMUNITY): Payer: BC Managed Care – PPO | Attending: Hematology

## 2021-10-11 ENCOUNTER — Encounter (HOSPITAL_COMMUNITY): Payer: Self-pay

## 2021-10-11 VITALS — BP 117/81 | HR 70 | Temp 97.9°F | Resp 20 | Wt 268.5 lb

## 2021-10-11 DIAGNOSIS — Z79899 Other long term (current) drug therapy: Secondary | ICD-10-CM | POA: Diagnosis not present

## 2021-10-11 DIAGNOSIS — C9000 Multiple myeloma not having achieved remission: Secondary | ICD-10-CM | POA: Insufficient documentation

## 2021-10-11 DIAGNOSIS — Z5112 Encounter for antineoplastic immunotherapy: Secondary | ICD-10-CM | POA: Diagnosis present

## 2021-10-11 LAB — CBC WITH DIFFERENTIAL/PLATELET
Abs Immature Granulocytes: 0.01 10*3/uL (ref 0.00–0.07)
Basophils Absolute: 0 10*3/uL (ref 0.0–0.1)
Basophils Relative: 1 %
Eosinophils Absolute: 0 10*3/uL (ref 0.0–0.5)
Eosinophils Relative: 1 %
HCT: 32 % — ABNORMAL LOW (ref 36.0–46.0)
Hemoglobin: 10.4 g/dL — ABNORMAL LOW (ref 12.0–15.0)
Immature Granulocytes: 0 %
Lymphocytes Relative: 41 %
Lymphs Abs: 1.5 10*3/uL (ref 0.7–4.0)
MCH: 30.6 pg (ref 26.0–34.0)
MCHC: 32.5 g/dL (ref 30.0–36.0)
MCV: 94.1 fL (ref 80.0–100.0)
Monocytes Absolute: 0.6 10*3/uL (ref 0.1–1.0)
Monocytes Relative: 17 %
Neutro Abs: 1.5 10*3/uL — ABNORMAL LOW (ref 1.7–7.7)
Neutrophils Relative %: 40 %
Platelets: 249 10*3/uL (ref 150–400)
RBC: 3.4 MIL/uL — ABNORMAL LOW (ref 3.87–5.11)
RDW: 14.5 % (ref 11.5–15.5)
WBC: 3.6 10*3/uL — ABNORMAL LOW (ref 4.0–10.5)
nRBC: 0 % (ref 0.0–0.2)

## 2021-10-11 LAB — COMPREHENSIVE METABOLIC PANEL
ALT: 10 U/L (ref 0–44)
AST: 17 U/L (ref 15–41)
Albumin: 3.3 g/dL — ABNORMAL LOW (ref 3.5–5.0)
Alkaline Phosphatase: 72 U/L (ref 38–126)
Anion gap: 8 (ref 5–15)
BUN: 13 mg/dL (ref 6–20)
CO2: 24 mmol/L (ref 22–32)
Calcium: 8.6 mg/dL — ABNORMAL LOW (ref 8.9–10.3)
Chloride: 106 mmol/L (ref 98–111)
Creatinine, Ser: 0.94 mg/dL (ref 0.44–1.00)
GFR, Estimated: >60 mL/min (ref >60)
Glucose, Bld: 172 mg/dL — ABNORMAL HIGH (ref 70–99)
Potassium: 3.6 mmol/L (ref 3.5–5.1)
Sodium: 138 mmol/L (ref 135–145)
Total Bilirubin: 0.3 mg/dL (ref 0.3–1.2)
Total Protein: 6.8 g/dL (ref 6.5–8.1)

## 2021-10-11 MED ORDER — BORTEZOMIB CHEMO SQ INJECTION 3.5 MG (2.5MG/ML)
1.0400 mg/m2 | Freq: Once | INTRAMUSCULAR | Status: AC
  Administered 2021-10-11: 2.5 mg via SUBCUTANEOUS
  Filled 2021-10-11: qty 1

## 2021-10-11 NOTE — Patient Instructions (Signed)
[facility]  Discharge Instructions: Thank you for choosing [facility] to provide your oncology and hematology care.  If you have a lab appointment with the Cancer Center, please come in thru the Main Entrance and check in at the main information desk.  Wear comfortable clothing and clothing appropriate for easy access to any Portacath or PICC line.   We strive to give you quality time with your provider. You may need to reschedule your appointment if you arrive late (15 or more minutes).  Arriving late affects you and other patients whose appointments are after yours.  Also, if you miss three or more appointments without notifying the office, you may be dismissed from the clinic at the provider's discretion.      For prescription refill requests, have your pharmacy contact our office and allow 72 hours for refills to be completed.    Today you received the following chemotherapy and/or immunotherapy agents    To help prevent nausea and vomiting after your treatment, we encourage you to take your nausea medication as directed.  BELOW ARE SYMPTOMS THAT SHOULD BE REPORTED IMMEDIATELY: . *FEVER GREATER THAN 100.4 F (38 C) OR HIGHER . *CHILLS OR SWEATING . *NAUSEA AND VOMITING THAT IS NOT CONTROLLED WITH YOUR NAUSEA MEDICATION . *UNUSUAL SHORTNESS OF BREATH . *UNUSUAL BRUISING OR BLEEDING . *URINARY PROBLEMS (pain or burning when urinating, or frequent urination) . *BOWEL PROBLEMS (unusual diarrhea, constipation, pain near the anus) . TENDERNESS IN MOUTH AND THROAT WITH OR WITHOUT PRESENCE OF ULCERS (sore throat, sores in mouth, or a toothache) . UNUSUAL RASH, SWELLING OR PAIN  . UNUSUAL VAGINAL DISCHARGE OR ITCHING   Items with * indicate a potential emergency and should be followed up as soon as possible or go to the Emergency Department if any problems should occur.  Please show the CHEMOTHERAPY ALERT CARD or IMMUNOTHERAPY ALERT CARD at check-in to the  Emergency Department and triage nurse.  Should you have questions after your visit or need to cancel or reschedule your appointment, please contact [facility] [phone]  and follow the prompts.  Office hours are 8:00 a.m. to 4:30 p.m. Monday - Friday. Please note that voicemails left after 4:00 p.m. may not be returned until the following business day.  We are closed weekends and major holidays. You have access to a nurse at all times for urgent questions. Please call the main number to the clinic [phone] and follow the prompts.  For any non-urgent questions, you may also contact your provider using MyChart. We now offer e-Visits for anyone 18 and older to request care online for non-urgent symptoms. For details visit [email or web address].   Also download the MyChart app! Go to the app store, search "MyChart", open the app, select [facility], and log in with your MyChart username and password.  Due to Covid, a mask is required upon entering the hospital/clinic. If you do not have a mask, one will be given to you upon arrival. For doctor visits, patients may have 1 support person aged 18 or older with them. For treatment visits, patients cannot have anyone with them due to current Covid guidelines and our immunocompromised population.  

## 2021-10-11 NOTE — Progress Notes (Signed)
Patient tolerated Velcade injection with no complaints voiced. Lab work reviewed. See MAR for details. Injection site clean and dry with no bruising or swelling noted. Patient stable during and after injection. Band aid applied. VSS. Patient left in satisfactory condition with no s/s of distress noted. 

## 2021-10-11 NOTE — Telephone Encounter (Signed)
Received verification from [place] C, care specialist regarding delivery of Velcade scheduled for 10/17/21.  Henreitta Leber, [facility] notified. ?

## 2021-10-19 ENCOUNTER — Other Ambulatory Visit: Payer: Self-pay | Admitting: Internal Medicine

## 2021-10-19 DIAGNOSIS — K219 Gastro-esophageal reflux disease without esophagitis: Secondary | ICD-10-CM

## 2021-10-23 ENCOUNTER — Other Ambulatory Visit (HOSPITAL_COMMUNITY): Payer: Self-pay | Admitting: Hematology

## 2021-10-25 ENCOUNTER — Encounter (HOSPITAL_COMMUNITY): Payer: Self-pay

## 2021-10-25 ENCOUNTER — Inpatient Hospital Stay (HOSPITAL_COMMUNITY): Payer: BC Managed Care – PPO

## 2021-10-25 VITALS — BP 132/79 | HR 70 | Temp 97.0°F | Resp 20 | Wt 271.0 lb

## 2021-10-25 DIAGNOSIS — C9000 Multiple myeloma not having achieved remission: Secondary | ICD-10-CM

## 2021-10-25 DIAGNOSIS — Z5112 Encounter for antineoplastic immunotherapy: Secondary | ICD-10-CM | POA: Diagnosis not present

## 2021-10-25 LAB — COMPREHENSIVE METABOLIC PANEL
ALT: 9 U/L (ref 0–44)
AST: 16 U/L (ref 15–41)
Albumin: 3.4 g/dL — ABNORMAL LOW (ref 3.5–5.0)
Alkaline Phosphatase: 72 U/L (ref 38–126)
Anion gap: 7 (ref 5–15)
BUN: 15 mg/dL (ref 6–20)
CO2: 25 mmol/L (ref 22–32)
Calcium: 8.9 mg/dL (ref 8.9–10.3)
Chloride: 107 mmol/L (ref 98–111)
Creatinine, Ser: 0.81 mg/dL (ref 0.44–1.00)
GFR, Estimated: >60 mL/min (ref >60)
Glucose, Bld: 130 mg/dL — ABNORMAL HIGH (ref 70–99)
Potassium: 3.7 mmol/L (ref 3.5–5.1)
Sodium: 139 mmol/L (ref 135–145)
Total Bilirubin: 0.4 mg/dL (ref 0.3–1.2)
Total Protein: 6.7 g/dL (ref 6.5–8.1)

## 2021-10-25 LAB — CBC WITH DIFFERENTIAL/PLATELET
Abs Immature Granulocytes: 0.02 10*3/uL (ref 0.00–0.07)
Basophils Absolute: 0.1 10*3/uL (ref 0.0–0.1)
Basophils Relative: 1 %
Eosinophils Absolute: 0.1 10*3/uL (ref 0.0–0.5)
Eosinophils Relative: 2 %
HCT: 33.1 % — ABNORMAL LOW (ref 36.0–46.0)
Hemoglobin: 10.2 g/dL — ABNORMAL LOW (ref 12.0–15.0)
Immature Granulocytes: 0 %
Lymphocytes Relative: 26 %
Lymphs Abs: 1.4 10*3/uL (ref 0.7–4.0)
MCH: 29 pg (ref 26.0–34.0)
MCHC: 30.8 g/dL (ref 30.0–36.0)
MCV: 94 fL (ref 80.0–100.0)
Monocytes Absolute: 0.7 10*3/uL (ref 0.1–1.0)
Monocytes Relative: 13 %
Neutro Abs: 3.1 10*3/uL (ref 1.7–7.7)
Neutrophils Relative %: 58 %
Platelets: 203 10*3/uL (ref 150–400)
RBC: 3.52 MIL/uL — ABNORMAL LOW (ref 3.87–5.11)
RDW: 15.6 % — ABNORMAL HIGH (ref 11.5–15.5)
WBC: 5.4 10*3/uL (ref 4.0–10.5)
nRBC: 0 % (ref 0.0–0.2)

## 2021-10-25 LAB — MAGNESIUM: Magnesium: 1.9 mg/dL (ref 1.7–2.4)

## 2021-10-25 MED ORDER — BORTEZOMIB CHEMO SQ INJECTION 3.5 MG (2.5MG/ML)
1.0400 mg/m2 | Freq: Once | INTRAMUSCULAR | Status: AC
  Administered 2021-10-25: 2.5 mg via SUBCUTANEOUS
  Filled 2021-10-25: qty 1

## 2021-10-25 NOTE — Progress Notes (Signed)
Patient took compazine at home before visit for Velcade.  ? ?Patient tolerated Velcade injection with no complaints voiced.  Lab work reviewed.  See MAR for details.  Injection site clean and dry with no bruising or swelling noted.  Patient stable during and after injection.  Band aid applied.  VSS.  Patient left in satisfactory condition with no s/s of distress noted.   ?

## 2021-10-25 NOTE — Patient Instructions (Signed)
[facility]  Discharge Instructions: ?Thank you for choosing [facility] to provide your oncology and hematology care.  ?If you have a lab appointment with the [facility], please come in thru the Main Entrance and check in at the main information desk. ? ?Wear comfortable clothing and clothing appropriate for easy access to any Portacath or PICC line.  ? ?We strive to give you quality time with your provider. You may need to reschedule your appointment if you arrive late (15 or more minutes).  Arriving late affects you and other patients whose appointments are after yours.  Also, if you miss three or more appointments without notifying the office, you may be dismissed from the clinic at the provider?s discretion.    ?  ?For prescription refill requests, have your pharmacy contact our office and allow 72 hours for refills to be completed.   ? ?Today you received the following chemotherapy and/or immunotherapy agents velcade.     ?  ?To help prevent nausea and vomiting after your treatment, we encourage you to take your nausea medication as directed. ? ?BELOW ARE SYMPTOMS THAT SHOULD BE REPORTED IMMEDIATELY: ?*FEVER GREATER THAN 100.4 F (38 ?C) OR HIGHER ?*CHILLS OR SWEATING ?*NAUSEA AND VOMITING THAT IS NOT CONTROLLED WITH YOUR NAUSEA MEDICATION ?*UNUSUAL SHORTNESS OF BREATH ?*UNUSUAL BRUISING OR BLEEDING ?*URINARY PROBLEMS (pain or burning when urinating, or frequent urination) ?*BOWEL PROBLEMS (unusual diarrhea, constipation, pain near the anus) ?TENDERNESS IN MOUTH AND THROAT WITH OR WITHOUT PRESENCE OF ULCERS (sore throat, sores in mouth, or a toothache) ?UNUSUAL RASH, SWELLING OR PAIN  ?UNUSUAL VAGINAL DISCHARGE OR ITCHING  ? ?Items with * indicate a potential emergency and should be followed up as soon as possible or go to the Emergency Department if any problems should occur. ? ?Please show the CHEMOTHERAPY ALERT CARD or IMMUNOTHERAPY ALERT CARD at check-in to the Emergency  Department and triage nurse. ? ?Should you have questions after your visit or need to cancel or reschedule your appointment, please contact [facility] [phone]  and follow the prompts.  Office hours are 8:00 a.m. to 4:30 p.m. Monday - Friday. Please note that voicemails left after 4:00 p.m. may not be returned until the following business day.  We are closed weekends and major holidays. You have access to a nurse at all times for urgent questions. Please call the main number to the clinic [phone] and follow the prompts. ? ?For any non-urgent questions, you may also contact your provider using MyChart. We now offer e-Visits for anyone 4 and older to request care online for non-urgent symptoms. For details visit [email or web address]. ?  ?Also download the MyChart app! Go to the app store, search "MyChart", open the app, select [facility], and log in with your MyChart username and password. ? ?Due to Covid, a mask is required upon entering the hospital/clinic. If you do not have a mask, one will be given to you upon arrival. For doctor visits, patients may have 1 support person aged 62 or older with them. For treatment visits, patients cannot have anyone with them due to current Covid guidelines and our immunocompromised population.  ?

## 2021-10-27 ENCOUNTER — Other Ambulatory Visit (HOSPITAL_COMMUNITY): Payer: Self-pay | Admitting: Physician Assistant

## 2021-10-27 ENCOUNTER — Other Ambulatory Visit: Payer: Self-pay | Admitting: Internal Medicine

## 2021-10-27 DIAGNOSIS — F411 Generalized anxiety disorder: Secondary | ICD-10-CM

## 2021-10-27 DIAGNOSIS — C9000 Multiple myeloma not having achieved remission: Secondary | ICD-10-CM

## 2021-10-31 ENCOUNTER — Other Ambulatory Visit (HOSPITAL_COMMUNITY): Payer: Self-pay

## 2021-10-31 ENCOUNTER — Other Ambulatory Visit (HOSPITAL_COMMUNITY): Payer: Self-pay | Admitting: Hematology

## 2021-10-31 MED ORDER — POMALIDOMIDE 2 MG PO CAPS: ORAL_CAPSULE | ORAL | 0 refills | Status: DC

## 2021-10-31 NOTE — Telephone Encounter (Signed)
Chart reviewed. Pomalyst refilled per last office note with Dr. Katragadda.  

## 2021-11-08 ENCOUNTER — Inpatient Hospital Stay (HOSPITAL_COMMUNITY): Payer: BC Managed Care – PPO | Attending: Hematology

## 2021-11-08 ENCOUNTER — Inpatient Hospital Stay (HOSPITAL_COMMUNITY): Payer: BC Managed Care – PPO

## 2021-11-08 VITALS — BP 101/69 | HR 57 | Temp 97.8°F | Resp 18 | Ht 62.0 in | Wt 268.6 lb

## 2021-11-08 DIAGNOSIS — Z79899 Other long term (current) drug therapy: Secondary | ICD-10-CM | POA: Diagnosis not present

## 2021-11-08 DIAGNOSIS — C9000 Multiple myeloma not having achieved remission: Secondary | ICD-10-CM

## 2021-11-08 DIAGNOSIS — Z5112 Encounter for antineoplastic immunotherapy: Secondary | ICD-10-CM | POA: Diagnosis not present

## 2021-11-08 LAB — COMPREHENSIVE METABOLIC PANEL
ALT: 11 U/L (ref 0–44)
AST: 14 U/L — ABNORMAL LOW (ref 15–41)
Albumin: 3.4 g/dL — ABNORMAL LOW (ref 3.5–5.0)
Alkaline Phosphatase: 77 U/L (ref 38–126)
Anion gap: 9 (ref 5–15)
BUN: 18 mg/dL (ref 6–20)
CO2: 25 mmol/L (ref 22–32)
Calcium: 9 mg/dL (ref 8.9–10.3)
Chloride: 106 mmol/L (ref 98–111)
Creatinine, Ser: 1 mg/dL (ref 0.44–1.00)
GFR, Estimated: >60 mL/min (ref >60)
Glucose, Bld: 166 mg/dL — ABNORMAL HIGH (ref 70–99)
Potassium: 3.9 mmol/L (ref 3.5–5.1)
Sodium: 140 mmol/L (ref 135–145)
Total Bilirubin: 0.5 mg/dL (ref 0.3–1.2)
Total Protein: 7.1 g/dL (ref 6.5–8.1)

## 2021-11-08 LAB — CBC WITH DIFFERENTIAL/PLATELET
Abs Immature Granulocytes: 0.01 10*3/uL (ref 0.00–0.07)
Basophils Absolute: 0.1 10*3/uL (ref 0.0–0.1)
Basophils Relative: 2 %
Eosinophils Absolute: 0.1 10*3/uL (ref 0.0–0.5)
Eosinophils Relative: 2 %
HCT: 31.7 % — ABNORMAL LOW (ref 36.0–46.0)
Hemoglobin: 10.1 g/dL — ABNORMAL LOW (ref 12.0–15.0)
Immature Granulocytes: 0 %
Lymphocytes Relative: 43 %
Lymphs Abs: 1.4 10*3/uL (ref 0.7–4.0)
MCH: 30 pg (ref 26.0–34.0)
MCHC: 31.9 g/dL (ref 30.0–36.0)
MCV: 94.1 fL (ref 80.0–100.0)
Monocytes Absolute: 0.6 10*3/uL (ref 0.1–1.0)
Monocytes Relative: 17 %
Neutro Abs: 1.2 10*3/uL — ABNORMAL LOW (ref 1.7–7.7)
Neutrophils Relative %: 36 %
Platelets: 225 10*3/uL (ref 150–400)
RBC: 3.37 MIL/uL — ABNORMAL LOW (ref 3.87–5.11)
RDW: 15.6 % — ABNORMAL HIGH (ref 11.5–15.5)
WBC: 3.3 10*3/uL — ABNORMAL LOW (ref 4.0–10.5)
nRBC: 0 % (ref 0.0–0.2)

## 2021-11-08 LAB — MAGNESIUM: Magnesium: 1.8 mg/dL (ref 1.7–2.4)

## 2021-11-08 LAB — LACTATE DEHYDROGENASE: LDH: 113 U/L (ref 98–192)

## 2021-11-08 MED ORDER — PROCHLORPERAZINE MALEATE 10 MG PO TABS: 10.0000 mg | ORAL_TABLET | Freq: Once | ORAL | Status: DC

## 2021-11-08 MED ORDER — BORTEZOMIB CHEMO SQ INJECTION 3.5 MG (2.5MG/ML)
1.0400 mg/m2 | Freq: Once | INTRAMUSCULAR | Status: AC
  Administered 2021-11-08: 2.5 mg via SUBCUTANEOUS
  Filled 2021-11-08: qty 1

## 2021-11-08 NOTE — Progress Notes (Signed)
Patient presents today for Velcade injection.  Patient is in satisfactory condition with only complaints of new onset hand tremors.  No other complaints voiced.  ANC today is 1.2.  MD notified.  All other labs are within treatment parameters.  Vital signs are stable.  We will proceed with treatment per MD orders.  ? ?Patient tolerated injection with no complaints voiced.  Site clean and dry with no bruising or swelling noted.  No complaints of pain.  Discharged with vital signs stable and no signs or symptoms of distress noted.   ?

## 2021-11-08 NOTE — Patient Instructions (Signed)
[facility]  Discharge Instructions: Thank you for choosing [facility] to provide your oncology and hematology care.  If you have a lab appointment with the Cancer Center, please come in thru the Main Entrance and check in at the main information desk.  Wear comfortable clothing and clothing appropriate for easy access to any Portacath or PICC line.   We strive to give you quality time with your provider. You may need to reschedule your appointment if you arrive late (15 or more minutes).  Arriving late affects you and other patients whose appointments are after yours.  Also, if you miss three or more appointments without notifying the office, you may be dismissed from the clinic at the provider's discretion.      For prescription refill requests, have your pharmacy contact our office and allow 72 hours for refills to be completed.        To help prevent nausea and vomiting after your treatment, we encourage you to take your nausea medication as directed.  BELOW ARE SYMPTOMS THAT SHOULD BE REPORTED IMMEDIATELY: *FEVER GREATER THAN 100.4 F (38 C) OR HIGHER *CHILLS OR SWEATING *NAUSEA AND VOMITING THAT IS NOT CONTROLLED WITH YOUR NAUSEA MEDICATION *UNUSUAL SHORTNESS OF BREATH *UNUSUAL BRUISING OR BLEEDING *URINARY PROBLEMS (pain or burning when urinating, or frequent urination) *BOWEL PROBLEMS (unusual diarrhea, constipation, pain near the anus) TENDERNESS IN MOUTH AND THROAT WITH OR WITHOUT PRESENCE OF ULCERS (sore throat, sores in mouth, or a toothache) UNUSUAL RASH, SWELLING OR PAIN  UNUSUAL VAGINAL DISCHARGE OR ITCHING   Items with * indicate a potential emergency and should be followed up as soon as possible or go to the Emergency Department if any problems should occur.  Please show the CHEMOTHERAPY ALERT CARD or IMMUNOTHERAPY ALERT CARD at check-in to the Emergency Department and triage nurse.  Should you have questions after your visit or need to cancel  or reschedule your appointment, please contact [facility] [phone]  and follow the prompts.  Office hours are 8:00 a.m. to 4:30 p.m. Monday - Friday. Please note that voicemails left after 4:00 p.m. may not be returned until the following business day.  We are closed weekends and major holidays. You have access to a nurse at all times for urgent questions. Please call the main number to the clinic [phone] and follow the prompts.  For any non-urgent questions, you may also contact your provider using MyChart. We now offer e-Visits for anyone 18 and older to request care online for non-urgent symptoms. For details visit [email or web address].   Also download the MyChart app! Go to the app store, search "MyChart", open the app, select , and log in with your MyChart username and password.  Due to Covid, a mask is required upon entering the hospital/clinic. If you do not have a mask, one will be given to you upon arrival. For doctor visits, patients may have 1 support person aged 18 or older with them. For treatment visits, patients cannot have anyone with them due to current Covid guidelines and our immunocompromised population.  

## 2021-11-09 LAB — KAPPA/LAMBDA LIGHT CHAINS
Kappa free light chain: 90.5 mg/L — ABNORMAL HIGH (ref 3.3–19.4)
Kappa, lambda light chain ratio: 0.67 (ref 0.26–1.65)
Lambda free light chains: 134.3 mg/L — ABNORMAL HIGH (ref 5.7–26.3)

## 2021-11-10 LAB — PROTEIN ELECTROPHORESIS, SERUM
A/G Ratio: 1 (ref 0.7–1.7)
Albumin ELP: 3.3 g/dL (ref 2.9–4.4)
Alpha-1-Globulin: 0.3 g/dL (ref 0.0–0.4)
Alpha-2-Globulin: 0.9 g/dL (ref 0.4–1.0)
Beta Globulin: 0.9 g/dL (ref 0.7–1.3)
Gamma Globulin: 1.2 g/dL (ref 0.4–1.8)
Globulin, Total: 3.3 g/dL (ref 2.2–3.9)
Total Protein ELP: 6.6 g/dL (ref 6.0–8.5)

## 2021-11-13 LAB — IMMUNOFIXATION ELECTROPHORESIS
IgA: 168 mg/dL (ref 87–352)
IgG (Immunoglobin G), Serum: 1018 mg/dL (ref 586–1602)
IgM (Immunoglobulin M), Srm: 62 mg/dL (ref 26–217)
Total Protein ELP: 6.2 g/dL (ref 6.0–8.5)

## 2021-11-21 ENCOUNTER — Other Ambulatory Visit (HOSPITAL_COMMUNITY): Payer: Self-pay | Admitting: Physician Assistant

## 2021-11-21 ENCOUNTER — Other Ambulatory Visit: Payer: Self-pay | Admitting: Internal Medicine

## 2021-11-21 DIAGNOSIS — C9000 Multiple myeloma not having achieved remission: Secondary | ICD-10-CM

## 2021-11-21 DIAGNOSIS — F411 Generalized anxiety disorder: Secondary | ICD-10-CM

## 2021-11-22 ENCOUNTER — Other Ambulatory Visit (HOSPITAL_COMMUNITY): Payer: BC Managed Care – PPO

## 2021-11-22 ENCOUNTER — Inpatient Hospital Stay (HOSPITAL_BASED_OUTPATIENT_CLINIC_OR_DEPARTMENT_OTHER): Payer: BC Managed Care – PPO | Admitting: Hematology

## 2021-11-22 ENCOUNTER — Inpatient Hospital Stay (HOSPITAL_COMMUNITY): Payer: BC Managed Care – PPO

## 2021-11-22 ENCOUNTER — Encounter (HOSPITAL_COMMUNITY): Payer: Self-pay | Admitting: Hematology

## 2021-11-22 VITALS — BP 104/69 | HR 63 | Temp 97.6°F | Resp 18 | Ht 62.0 in | Wt 269.4 lb

## 2021-11-22 DIAGNOSIS — C9000 Multiple myeloma not having achieved remission: Secondary | ICD-10-CM

## 2021-11-22 DIAGNOSIS — Z5112 Encounter for antineoplastic immunotherapy: Secondary | ICD-10-CM | POA: Diagnosis not present

## 2021-11-22 LAB — CBC WITH DIFFERENTIAL/PLATELET
Abs Immature Granulocytes: 0.03 10*3/uL (ref 0.00–0.07)
Basophils Absolute: 0.1 10*3/uL (ref 0.0–0.1)
Basophils Relative: 2 %
Eosinophils Absolute: 0.1 10*3/uL (ref 0.0–0.5)
Eosinophils Relative: 1 %
HCT: 30.7 % — ABNORMAL LOW (ref 36.0–46.0)
Hemoglobin: 9.6 g/dL — ABNORMAL LOW (ref 12.0–15.0)
Immature Granulocytes: 1 %
Lymphocytes Relative: 23 %
Lymphs Abs: 1.4 10*3/uL (ref 0.7–4.0)
MCH: 29.4 pg (ref 26.0–34.0)
MCHC: 31.3 g/dL (ref 30.0–36.0)
MCV: 93.9 fL (ref 80.0–100.0)
Monocytes Absolute: 0.9 10*3/uL (ref 0.1–1.0)
Monocytes Relative: 15 %
Neutro Abs: 3.5 10*3/uL (ref 1.7–7.7)
Neutrophils Relative %: 58 %
Platelets: 219 10*3/uL (ref 150–400)
RBC: 3.27 MIL/uL — ABNORMAL LOW (ref 3.87–5.11)
RDW: 15.8 % — ABNORMAL HIGH (ref 11.5–15.5)
WBC: 5.9 10*3/uL (ref 4.0–10.5)
nRBC: 0 % (ref 0.0–0.2)

## 2021-11-22 LAB — COMPREHENSIVE METABOLIC PANEL
ALT: 19 U/L (ref 0–44)
AST: 22 U/L (ref 15–41)
Albumin: 3.4 g/dL — ABNORMAL LOW (ref 3.5–5.0)
Alkaline Phosphatase: 85 U/L (ref 38–126)
Anion gap: 10 (ref 5–15)
BUN: 18 mg/dL (ref 6–20)
CO2: 24 mmol/L (ref 22–32)
Calcium: 8.8 mg/dL — ABNORMAL LOW (ref 8.9–10.3)
Chloride: 104 mmol/L (ref 98–111)
Creatinine, Ser: 0.94 mg/dL (ref 0.44–1.00)
GFR, Estimated: >60 mL/min (ref >60)
Glucose, Bld: 161 mg/dL — ABNORMAL HIGH (ref 70–99)
Potassium: 3.7 mmol/L (ref 3.5–5.1)
Sodium: 138 mmol/L (ref 135–145)
Total Bilirubin: 0.5 mg/dL (ref 0.3–1.2)
Total Protein: 7.1 g/dL (ref 6.5–8.1)

## 2021-11-22 LAB — MAGNESIUM: Magnesium: 1.7 mg/dL (ref 1.7–2.4)

## 2021-11-22 MED ORDER — ZOLEDRONIC ACID 4 MG/5ML IV CONC
3.0000 mg | Freq: Once | INTRAVENOUS | Status: AC
  Administered 2021-11-22: 3 mg via INTRAVENOUS
  Filled 2021-11-22: qty 3.75

## 2021-11-22 MED ORDER — SODIUM CHLORIDE 0.9 % IV SOLN: Freq: Once | INTRAVENOUS | Status: AC

## 2021-11-22 MED ORDER — BORTEZOMIB CHEMO SQ INJECTION 3.5 MG (2.5MG/ML)
1.0400 mg/m2 | Freq: Once | INTRAMUSCULAR | Status: AC
  Administered 2021-11-22: 2.5 mg via SUBCUTANEOUS
  Filled 2021-11-22: qty 1

## 2021-11-22 NOTE — Progress Notes (Signed)
Pt presents today for Zometa and Velcade injection per provider's order. Vital signs and labs WNL for treatment today. Pt took Compazine at home prior to arrival. Okay to proceed with treatment per Dr.K. ? ?Zometa IV and Velcade injection given today per MD orders. Tolerated infusion without adverse affects. Vital signs stable. No complaints at this time. Discharged from clinic ambulatory in stable condition. Alert and oriented x 3. F/U with [facility] as scheduled.   ?

## 2021-11-22 NOTE — Patient Instructions (Addendum)
[facility] at [facility] ?Discharge Instructions ? ? ?You were seen and examined today by Dr. Delton Coombes. ? ?He reviewed the results of your lab work which is normal/stable.  ? ?We will proceed with Velcade and Zometa infusion today.  ? ?Continue Pomalyst as prescribed.  ? ?Return as scheduled.  ? ? ?Thank you for choosing [place] [facility] at [facility] to provide your oncology and hematology care.  To afford each patient quality time with our provider, please arrive at least 15 minutes before your scheduled appointment time.  ? ?If you have a lab appointment with the [facility] please come in thru the Main Entrance and check in at the main information desk. ? ?You need to re-schedule your appointment should you arrive 10 or more minutes late.  We strive to give you quality time with our providers, and arriving late affects you and other patients whose appointments are after yours.  Also, if you no show three or more times for appointments you may be dismissed from the clinic at the providers discretion.     ?Again, thank you for choosing [facility].  Our hope is that these requests will decrease the amount of time that you wait before being seen by our physicians.       ?_____________________________________________________________ ? ?Should you have questions after your visit to [facility], please contact our office at [phone] and follow the prompts.  Our office hours are 8:00 a.m. and 4:30 p.m. Monday - Friday.  Please note that voicemails left after 4:00 p.m. may not be returned until the following business day.  We are closed weekends and major holidays.  You do have access to a nurse 24-7, just call the main number to the clinic [phone] and do not press any options, hold on the line and a nurse will answer the phone.   ? ?For prescription refill requests, have your pharmacy contact our office and allow 72 hours.   ? ?Due to  Covid, you will need to wear a mask upon entering the hospital. If you do not have a mask, a mask will be given to you at the Main Entrance upon arrival. For doctor visits, patients may have 1 support person age 67 or older with them. For treatment visits, patients can not have anyone with them due to social distancing guidelines and our immunocompromised population.  ? ?   ?

## 2021-11-22 NOTE — Progress Notes (Signed)
Patient is taking Pomalyst as prescribed.  She has not missed any doses and reports no side effects at this time.  ? ?Patient has been examined by Dr. Delton Coombes, and vital signs and labs have been reviewed. ANC, Creatinine, LFTs, hemoglobin, and platelets are within treatment parameters per M.D. - pt may proceed with treatment.    ?

## 2021-11-22 NOTE — Progress Notes (Signed)
Patient is taking Pomalyst as prescribed. ?

## 2021-11-22 NOTE — Progress Notes (Signed)
? ?[facility] ?[address] ?[place], [place] [address] ? ? ?CLINIC:  ?Medical Oncology/Hematology ? ?PCP:  ?Lindell Spar, MD ?[address] / [place] [address] ?[phone] ? ? ?REASON FOR VISIT:  ?Follow-up for multiple myeloma ? ?PRIOR THERAPY:  ?1. RVD x 4 cycles from 11/01/2017 through 01/14/2018. ?2. Stem cell transplant on 02/20/2018. ?3. Velcade from 06/11/2018 to 12/22/2019. ? ?NGS Results: not done ? ?CURRENT THERAPY: Velcade every 2 weeks ? ?BRIEF ONCOLOGIC HISTORY:  ?Oncology History  ?Multiple myeloma ([place])  ?10/29/2017 Initial Diagnosis  ? Multiple myeloma ([place]) ? ?  ?11/01/2017 - 01/24/2018 Chemotherapy  ? The patient had dexamethasone (DECADRON) 4 MG tablet, 1 of 1 cycle, Start date: --, End date: -- ?lenalidomide (REVLIMID) 25 MG capsule, 1 of 1 cycle, Start date: --, End date: -- ?bortezomib SQ (VELCADE) chemo injection 3 mg, 1.3 mg/m2 = 3 mg, Subcutaneous,  Once, 5 of 5 cycles ?Administration: 3 mg (11/01/2017), 3 mg (11/08/2017), 3 mg (11/05/2017), 3 mg (11/22/2017), 3 mg (11/12/2017), 3 mg (11/29/2017), 3 mg (11/26/2017), 3 mg (12/03/2017), 3 mg (12/13/2017), 3 mg (12/20/2017), 3 mg (12/17/2017), 3 mg (12/24/2017), 3 mg (01/03/2018), 3 mg (01/10/2018), 3 mg (01/07/2018), 3 mg (01/14/2018), 3 mg (01/24/2018) ? ? for chemotherapy treatment.  ? ?  ?06/11/2018 -  Chemotherapy  ? Patient is on Treatment Plan : MYELOMA MAINTENANCE Bortezomib SQ q 7d x 6 weeks, two weeks off then q 14d  ? ?   ? ? ?CANCER STAGING: ? Cancer Staging  ?Multiple myeloma ([place]) ?Staging form: Plasma Cell Myeloma and Plasma Cell Disorders, AJCC 8th Edition ?- Clinical: No stage assigned - Unsigned ?- Clinical: No stage assigned - Unsigned ? ? ?INTERVAL HISTORY:  ?Ms. NIOBE DICK, a 56 y.o. female, returns for routine follow-up and consideration for next cycle of chemotherapy. Ferne was last seen on 08/30/2021. ? ?Due for cycle #86 of Velcade today.  ? ?Overall, she tells me she has been feeling pretty well. The numbness and  burning in her feet had worsened and now moved up to her right calf; she reports this burning pain is still better than the neuropathy pains she was having 1 month ago. She denies nausea. She continues to take 200 mg Lyrica BID. She denies cramping. She has a crown placed in April.  ? ?Overall, she feels ready for next cycle of chemo today.  ? ? ?REVIEW OF SYSTEMS:  ?Review of Systems  ?Constitutional:  Negative for appetite change and fatigue.  ?Gastrointestinal:  Negative for nausea.  ?Neurological:  Positive for numbness.  ?All other systems reviewed and are negative. ? ?PAST MEDICAL/SURGICAL HISTORY:  ?Past Medical History:  ?Diagnosis Date  ? Acid reflux   ? Allergic rhinitis   ? Cancer [facility])   ? multiple myeloma  ? Diabetes mellitus   ? type 2  ? Gout   ? Gout   ? H/o COVID-19--- was Positive 09/17/2019, Negative 12/23/19 AND also Neg on  12/26/19 12/26/2019  ? HBP (high blood pressure)   ? History of kidney stones   ? Migraines   ? ?Past Surgical History:  ?Procedure Laterality Date  ? BREAST CYST EXCISION Left   ? 2009 no visible scar on skin  ? CESAREAN SECTION    ? COLONOSCOPY WITH PROPOFOL N/A 12/25/2019  ? Procedure: COLONOSCOPY WITH PROPOFOL;  Surgeon: Rogene Houston, MD;  Location: [facility];  Service: Endoscopy;  Laterality: N/A;  730  ? EXTRACORPOREAL SHOCK WAVE LITHOTRIPSY Left  10/10/2017  ? Procedure: LEFT EXTRACORPOREAL SHOCK WAVE LITHOTRIPSY (ESWL);  Surgeon: Bjorn Loser, MD;  Location: [facility] ORS;  Service: Urology;  Laterality: Left;  ? EYE SURGERY    ? HEMORRHOID SURGERY N/A 11/19/2012  ? Procedure: HEMORRHOIDECTOMY;  Surgeon: Jamesetta So, MD;  Location: [facility] ORS;  Service: General;  Laterality: N/A;  ? kidney stones  1998  ? LAPAROSCOPIC UNILATERAL SALPINGO OOPHERECTOMY  05/14/2012  ? Procedure: LAPAROSCOPIC UNILATERAL SALPINGO OOPHORECTOMY;  Surgeon: [name], MD;  Location: [facility] ORS;  Service: Gynecology;  Laterality: Right;  laparoscopic right salpingo-oophorectomy  ? PARTIAL  HYSTERECTOMY    ? POLYPECTOMY  12/25/2019  ? Procedure: POLYPECTOMY;  Surgeon: Rogene Houston, MD;  Location: [facility];  Service: Endoscopy;;  ? TONSILECTOMY, ADENOIDECTOMY, BILATERAL MYRINGOTOMY AND TUBES    ? VESICOVAGINAL FISTULA CLOSURE W/ TAH    ? ? ?SOCIAL HISTORY:  ?Social History  ? ?Socioeconomic History  ? Marital status: Married  ?  Spouse name: [name]  ? Number of children: 1  ? Years of education: 73  ? Highest education level: Some college, no degree  ?Occupational History  ?  Employer: [employer]  ?Tobacco Use  ? Smoking status: Former  ?  Types: Cigarettes  ?  Quit date: 02/27/1999  ?  Years since quitting: 22.7  ? Smokeless tobacco: Never  ? Tobacco comments:  ?  socially   ?Vaping Use  ? Vaping Use: Never used  ?Substance and Sexual Activity  ? Alcohol use: No  ?  Alcohol/week: 0.0 standard drinks  ? Drug use: No  ? Sexual activity: Yes  ?  Birth control/protection: Surgical  ?  Comment: hyst  ?Other Topics Concern  ? Not on file  ?Social History Narrative  ? Not on file  ? ?Social Determinants of Health  ? ?Financial Resource Strain: Low Risk   ? Difficulty of Paying Living Expenses: Not hard at all  ?Food Insecurity: Not on file  ?Transportation Needs: No Transportation Needs  ? Lack of Transportation (Medical): No  ? Lack of Transportation (Non-Medical): No  ?Physical Activity: Unknown  ? Days of Exercise per Week: Not on file  ? Minutes of Exercise per Session: 10 min  ?Stress: No Stress Concern Present  ? Feeling of Stress : Not at all  ?Social Connections: Socially Integrated  ? Frequency of Communication with Friends and Family: More than three times a week  ? Frequency of Social Gatherings with Friends and Family: Twice a week  ? Attends Religious Services: More than 4 times per year  ? Active Member of Clubs or Organizations: Yes  ? Attends Archivist Meetings: More than 4 times per year  ? Marital Status: Married  ?Intimate Partner Violence: Not At Risk  ? Fear of Current or  Ex-Partner: No  ? Emotionally Abused: No  ? Physically Abused: No  ? Sexually Abused: No  ? ? ?FAMILY HISTORY:  ?Family History  ?Problem Relation Age of Onset  ? Arthritis Other   ? Cancer Other   ? Diabetes Other   ? Hypertension Mother   ? Dementia Mother   ? Diabetes Father   ? ALS Father   ? Diabetes Brother   ? Hypertension Brother   ? Cancer Paternal Aunt   ? COPD Maternal Grandmother   ? Cancer Maternal Grandfather   ? Anesthesia problems Paternal Grandfather   ? ? ?CURRENT MEDICATIONS:  ?Current Outpatient Medications  ?Medication Sig Dispense Refill  ? acyclovir (ZOVIRAX) 400 MG tablet  TAKE (1) TABLET BY MOUTH TWICE DAILY. 60 tablet 0  ? albuterol (VENTOLIN HFA) 108 (90 Base) MCG/ACT inhaler Inhale 2 puffs into the lungs every 4 (four) hours as needed for wheezing or shortness of breath. 1 each 3  ? allopurinol (ZYLOPRIM) 300 MG tablet TAKE (1) TABLET BY MOUTH ONCE DAILY. 90 tablet 0  ? amLODipine (NORVASC) 2.5 MG tablet Take 2.5 mg by mouth daily.    ? aspirin EC 81 MG tablet Take 1 tablet (81 mg total) by mouth daily. 30 tablet 11  ? bumetanide (BUMEX) 0.5 MG tablet Take 0.5 mg by mouth daily.     ? carvedilol (COREG) 25 MG tablet Take 1 tablet (25 mg total) by mouth 2 (two) times daily.    ? diazepam (VALIUM) 5 MG tablet TAKE (1) TABLET BY MOUTH AT BEDTIME. 30 tablet 0  ? diclofenac Sodium (VOLTAREN) 1 % GEL Apply 2 g topically 4 (four) times daily. 100 g 0  ? dicyclomine (BENTYL) 20 MG tablet Take 1 tablet (20 mg total) by mouth 2 (two) times daily as needed for spasms. 20 tablet 0  ? Docosanol 10 % CREA Apply 1 application topically daily as needed (for nose).     ? docusate sodium (COLACE) 100 MG capsule Take 100 mg by mouth daily.     ? Dulaglutide 0.75 MG/0.5ML SOPN Inject into the skin.    ? erythromycin ophthalmic ointment Place 1 application into the left eye at bedtime. 3.5 g 0  ? escitalopram (LEXAPRO) 10 MG tablet TAKE (1) TABLET BY MOUTH ONCE DAILY. MAY START WITH 1/2 TABLET FOR 7 DAYS. 90  tablet 0  ? fluticasone (FLONASE) 50 MCG/ACT nasal spray INSTILL 2 SPRAYS INTO BOTH NOSTRILS DAILY 16 g 4  ? Lactulose 20 GM/30ML SOLN Take 30 mLs (20 g total) by mouth daily. 450 mL 1  ? losartan (COZAAR) 100 MG ta

## 2021-11-22 NOTE — Patient Instructions (Signed)
[facility]  Discharge Instructions: ?Thank you for choosing [facility] to provide your oncology and hematology care.  ?If you have a lab appointment with the [facility], please come in thru the Main Entrance and check in at the main information desk. ? ?Wear comfortable clothing and clothing appropriate for easy access to any Portacath or PICC line.  ? ?We strive to give you quality time with your provider. You may need to reschedule your appointment if you arrive late (15 or more minutes).  Arriving late affects you and other patients whose appointments are after yours.  Also, if you miss three or more appointments without notifying the office, you may be dismissed from the clinic at the provider?s discretion.    ?  ?For prescription refill requests, have your pharmacy contact our office and allow 72 hours for refills to be completed.   ? ?Today you received the following chemotherapy and/or immunotherapy agents Velcade and Zometa ?  ?To help prevent nausea and vomiting after your treatment, we encourage you to take your nausea medication as directed. ? ?BELOW ARE SYMPTOMS THAT SHOULD BE REPORTED IMMEDIATELY: ?*FEVER GREATER THAN 100.4 F (38 ?C) OR HIGHER ?*CHILLS OR SWEATING ?*NAUSEA AND VOMITING THAT IS NOT CONTROLLED WITH YOUR NAUSEA MEDICATION ?*UNUSUAL SHORTNESS OF BREATH ?*UNUSUAL BRUISING OR BLEEDING ?*URINARY PROBLEMS (pain or burning when urinating, or frequent urination) ?*BOWEL PROBLEMS (unusual diarrhea, constipation, pain near the anus) ?TENDERNESS IN MOUTH AND THROAT WITH OR WITHOUT PRESENCE OF ULCERS (sore throat, sores in mouth, or a toothache) ?UNUSUAL RASH, SWELLING OR PAIN  ?UNUSUAL VAGINAL DISCHARGE OR ITCHING  ? ?Items with * indicate a potential emergency and should be followed up as soon as possible or go to the Emergency Department if any problems should occur. ? ?Please show the CHEMOTHERAPY ALERT CARD or IMMUNOTHERAPY ALERT CARD at check-in to the Emergency  Department and triage nurse. ? ?Should you have questions after your visit or need to cancel or reschedule your appointment, please contact [facility] [phone]  and follow the prompts.  Office hours are 8:00 a.m. to 4:30 p.m. Monday - Friday. Please note that voicemails left after 4:00 p.m. may not be returned until the following business day.  We are closed weekends and major holidays. You have access to a nurse at all times for urgent questions. Please call the main number to the clinic [phone] and follow the prompts. ? ?For any non-urgent questions, you may also contact your provider using MyChart. We now offer e-Visits for anyone 57 and older to request care online for non-urgent symptoms. For details visit [email or web address]. ?  ?Also download the MyChart app! Go to the app store, search "MyChart", open the app, select [facility], and log in with your MyChart username and password. ? ?Due to Covid, a mask is required upon entering the hospital/clinic. If you do not have a mask, one will be given to you upon arrival. For doctor visits, patients may have 1 support person aged 73 or older with them. For treatment visits, patients cannot have anyone with them due to current Covid guidelines and our immunocompromised population.  ? ? ? ? ?

## 2021-11-27 ENCOUNTER — Encounter: Payer: Self-pay | Admitting: Internal Medicine

## 2021-11-27 ENCOUNTER — Ambulatory Visit (INDEPENDENT_AMBULATORY_CARE_PROVIDER_SITE_OTHER): Payer: BC Managed Care – PPO | Admitting: Internal Medicine

## 2021-11-27 VITALS — BP 138/82 | HR 67 | Resp 18 | Ht 64.0 in | Wt 270.2 lb

## 2021-11-27 DIAGNOSIS — N3281 Overactive bladder: Secondary | ICD-10-CM

## 2021-11-27 DIAGNOSIS — R350 Frequency of micturition: Secondary | ICD-10-CM

## 2021-11-27 LAB — POCT URINALYSIS DIP (CLINITEK)
Bilirubin, UA: NEGATIVE
Glucose, UA: NEGATIVE mg/dL
Ketones, POC UA: NEGATIVE mg/dL
Leukocytes, UA: NEGATIVE
Nitrite, UA: NEGATIVE
POC PROTEIN,UA: 30 — AB
Spec Grav, UA: 1.015 (ref 1.010–1.025)
Urobilinogen, UA: 0.2 E.U./dL
pH, UA: 6.5 (ref 5.0–8.0)

## 2021-11-27 NOTE — Progress Notes (Unsigned)
Acute Office Visit  Subjective:    Patient ID: Joanna Reid, female    DOB: 02/11/1966, 56 y.o.   MRN: [id number]  Chief Complaint  Patient presents with   Urinary Tract Infection    Pt started having pain in vagina they were sharp has frequent urination and chills with fevers this started 11-24-21    HPI Patient is in today for complaint of urinary frequency for the last 3 days.  She denies any fever, chills, dysuria or hematuria.  She has noticed urinary frequency concern since since she has started her new chemotherapy for MM.  She also reports mild discomfort in her vaginal area, but denies any abnormal bleeding or discharge currently.  Denies any pelvic or flank pain currently.  Past Medical History:  Diagnosis Date   Acid reflux    Allergic rhinitis    Cancer ([place])    multiple myeloma   Diabetes mellitus    type 2   Gout    Gout    H/o COVID-19--- was Positive 09/17/2019, Negative 12/23/19 AND also Neg on  12/26/19 12/26/2019   HBP (high blood pressure)    History of kidney stones    Migraines     Past Surgical History:  Procedure Laterality Date   BREAST CYST EXCISION Left    2009 no visible scar on skin   CESAREAN SECTION     COLONOSCOPY WITH PROPOFOL N/A 12/25/2019   Procedure: COLONOSCOPY WITH PROPOFOL;  Surgeon: Rogene Houston, MD;  Location: [facility];  Service: Endoscopy;  Laterality: N/A;  730   EXTRACORPOREAL SHOCK WAVE LITHOTRIPSY Left 10/10/2017   Procedure: LEFT EXTRACORPOREAL SHOCK WAVE LITHOTRIPSY (ESWL);  Surgeon: Bjorn Loser, MD;  Location: [facility];  Service: Urology;  Laterality: Left;   EYE SURGERY     HEMORRHOID SURGERY N/A 11/19/2012   Procedure: HEMORRHOIDECTOMY;  Surgeon: Jamesetta So, MD;  Location: [facility];  Service: General;  Laterality: N/A;   kidney stones  1998   LAPAROSCOPIC UNILATERAL SALPINGO OOPHERECTOMY  05/14/2012   Procedure: LAPAROSCOPIC UNILATERAL SALPINGO OOPHORECTOMY;  Surgeon: [name], MD;  Location: [facility];   Service: Gynecology;  Laterality: Right;  laparoscopic right salpingo-oophorectomy   PARTIAL HYSTERECTOMY     POLYPECTOMY  12/25/2019   Procedure: POLYPECTOMY;  Surgeon: Rogene Houston, MD;  Location: [facility];  Service: Endoscopy;;   TONSILECTOMY, ADENOIDECTOMY, BILATERAL MYRINGOTOMY AND TUBES     VESICOVAGINAL FISTULA CLOSURE W/ TAH      Family History  Problem Relation Age of Onset   Arthritis Other    Cancer Other    Diabetes Other    Hypertension Mother    Dementia Mother    Diabetes Father    ALS Father    Diabetes Brother    Hypertension Brother    Cancer Paternal Aunt    COPD Maternal Grandmother    Cancer Maternal Grandfather    Anesthesia problems Paternal Grandfather     Social History   Socioeconomic History   Marital status: Married    Spouse name: [name]   Number of children: 1   Years of education: 12   Highest education level: Some college, no degree  Occupational History    Employer: [employer]  Tobacco Use   Smoking status: Former    Types: Cigarettes    Quit date: 02/27/1999    Years since quitting: 22.7   Smokeless tobacco: Never   Tobacco comments:    socially   Vaping Use   Vaping  Use: Never used  Substance and Sexual Activity   Alcohol use: No    Alcohol/week: 0.0 standard drinks   Drug use: No   Sexual activity: Yes    Birth control/protection: Surgical    Comment: hyst  Other Topics Concern   Not on file  Social History Narrative   Not on file   Social Determinants of Health   Financial Resource Strain: Low Risk    Difficulty of Paying Living Expenses: Not hard at all  Food Insecurity: Not on file  Transportation Needs: No Transportation Needs   Lack of Transportation (Medical): No   Lack of Transportation (Non-Medical): No  Physical Activity: Unknown   Days of Exercise per Week: Not on file   Minutes of Exercise per Session: 10 min  Stress: No Stress Concern Present   Feeling of Stress : Not at all  Social Connections:  Socially Integrated   Frequency of Communication with Friends and Family: More than three times a week   Frequency of Social Gatherings with Friends and Family: Twice a week   Attends Religious Services: More than 4 times per year   Active Member of [employer] or Organizations: Yes   Attends Music therapist: More than 4 times per year   Marital Status: Married  Human resources officer Violence: Not At Risk   Fear of Current or Ex-Partner: No   Emotionally Abused: No   Physically Abused: No   Sexually Abused: No    Outpatient Medications Prior to Visit  Medication Sig Dispense Refill   acyclovir (ZOVIRAX) 400 MG tablet TAKE (1) TABLET BY MOUTH TWICE DAILY. 60 tablet 0   albuterol (VENTOLIN HFA) 108 (90 Base) MCG/ACT inhaler Inhale 2 puffs into the lungs every 4 (four) hours as needed for wheezing or shortness of breath. 1 each 3   allopurinol (ZYLOPRIM) 300 MG tablet TAKE (1) TABLET BY MOUTH ONCE DAILY. 90 tablet 0   amLODipine (NORVASC) 2.5 MG tablet Take 2.5 mg by mouth daily.     aspirin EC 81 MG tablet Take 1 tablet (81 mg total) by mouth daily. 30 tablet 11   bumetanide (BUMEX) 0.5 MG tablet Take 0.5 mg by mouth daily.      carvedilol (COREG) 25 MG tablet Take 1 tablet (25 mg total) by mouth 2 (two) times daily.     diazepam (VALIUM) 5 MG tablet TAKE (1) TABLET BY MOUTH AT BEDTIME. 30 tablet 0   diclofenac Sodium (VOLTAREN) 1 % GEL Apply 2 g topically 4 (four) times daily. 100 g 0   dicyclomine (BENTYL) 20 MG tablet Take 1 tablet (20 mg total) by mouth 2 (two) times daily as needed for spasms. 20 tablet 0   Docosanol 10 % CREA Apply 1 application topically daily as needed (for nose).      docusate sodium (COLACE) 100 MG capsule Take 100 mg by mouth daily.      Dulaglutide 0.75 MG/0.5ML SOPN Inject into the skin.     erythromycin ophthalmic ointment Place 1 application into the left eye at bedtime. 3.5 g 0   escitalopram (LEXAPRO) 10 MG tablet TAKE (1) TABLET BY MOUTH ONCE DAILY.  MAY START WITH 1/2 TABLET FOR 7 DAYS. 90 tablet 0   fluticasone (FLONASE) 50 MCG/ACT nasal spray INSTILL 2 SPRAYS INTO BOTH NOSTRILS DAILY 16 g 4   Lactulose 20 GM/30ML SOLN Take 30 mLs (20 g total) by mouth daily. 450 mL 1   losartan (COZAAR) 100 MG tablet Take 100 mg by mouth daily.  magnesium oxide (MAG-OX) 400 (241.3 Mg) MG tablet Take 1 tablet (400 mg total) by mouth 3 (three) times daily. 90 tablet 3   metFORMIN (GLUCOPHAGE) 500 MG tablet Take 500 mg by mouth 2 (two) times daily.     nitrofurantoin, macrocrystal-monohydrate, (MACROBID) 100 MG capsule TAKE (1) CAPSULE BY MOUTH AT BEDTIME. 90 capsule 0   ondansetron (ZOFRAN) 8 MG tablet TAKE 1 TABLET BY MOUTH EVERY 8 HOURS AS NEEDED. 30 tablet 6   pantoprazole (PROTONIX) 40 MG tablet TAKE (1) TABLET BY MOUTH ONCE DAILY. 90 tablet 1   pregabalin (LYRICA) 200 MG capsule TAKE (1) CAPSULE BY MOUTH TWICE DAILY. 60 capsule 5   prochlorperazine (COMPAZINE) 10 MG tablet TAKE 1 TABLET BY MOUTH EVERY 6 HOURS AS NEEDED FOR NAUSEA OR VOMITING. 30 tablet 6   Propylene Glycol (SYSTANE BALANCE) 0.6 % SOLN Apply 1 drop to eye daily as needed (dry eye).      rosuvastatin (CRESTOR) 5 MG tablet Take 5 mg by mouth daily.     valACYclovir (VALTREX) 1000 MG tablet TAKE 2 TABLETS BY MOUTH NOW; THEN 2 12 HOURS LATER. 12 tablet 6   VELCADE 3.5 MG injection RECONSTITUTE EACH VIAL AS DIRECTED. INJECT 2.5 MG UNDER THE SKIN EVERY 14 DAYS. 2 each 2   pomalidomide (POMALYST) 2 MG capsule TAKE 1 CAPSULE BY MOUTH ONCE DAILY FOR 21 DAYS ON AND 7 DAYS OFF 21 capsule 0   No facility-administered medications prior to visit.    No Known Allergies  Review of Systems  Constitutional:  Negative for chills and fever.  HENT:  Negative for congestion, sinus pressure, sinus pain and sore throat.   Eyes:  Negative for pain and discharge.  Respiratory:  Negative for cough and shortness of breath.   Cardiovascular:  Negative for chest pain and palpitations.  Gastrointestinal:   Negative for abdominal pain, diarrhea, nausea and vomiting.  Endocrine: Negative for polydipsia and polyuria.  Genitourinary:  Positive for frequency. Negative for dysuria and hematuria.  Musculoskeletal:  Positive for arthralgias and back pain. Negative for neck pain and neck stiffness.  Skin:  Negative for rash.  Neurological:  Positive for numbness. Negative for dizziness and weakness.  Psychiatric/Behavioral:  Negative for agitation and behavioral problems. The patient is nervous/anxious.       Objective:    Physical Exam Vitals reviewed.  Constitutional:      General: She is not in acute distress.    Appearance: She is obese. She is not diaphoretic.  HENT:     Head: Normocephalic and atraumatic.     Nose: Nose normal.     Mouth/Throat:     Mouth: Mucous membranes are moist.  Eyes:     General: No scleral icterus.    Extraocular Movements: Extraocular movements intact.  Cardiovascular:     Rate and Rhythm: Normal rate and regular rhythm.     Pulses: Normal pulses.     Heart sounds: Normal heart sounds. No murmur heard. Pulmonary:     Breath sounds: Normal breath sounds. No wheezing or rales.  Abdominal:     Palpations: Abdomen is soft.     Tenderness: There is no abdominal tenderness.  Musculoskeletal:        General: Tenderness (Lumbar spine area) present.     Cervical back: Neck supple. No tenderness.     Right lower leg: No edema.     Left lower leg: No edema.  Skin:    General: Skin is warm.     Findings: No  rash.  Neurological:     General: No focal deficit present.     Mental Status: She is alert and oriented to person, place, and time. Mental status is at baseline.     Motor: No weakness.     Gait: Gait normal.  Psychiatric:        Mood and Affect: Mood normal.        Behavior: Behavior normal.    BP 138/82 (BP Location: Right Arm, Patient Position: Sitting, Cuff Size: Normal)   Pulse 67   Resp 18   Ht 5' 4"  (1.626 m)   Wt 270 lb 3.2 oz (122.6 kg)    SpO2 97%   BMI 46.38 kg/m  Wt Readings from Last 3 Encounters:  11/27/21 270 lb 3.2 oz (122.6 kg)  11/22/21 269 lb 6.4 oz (122.2 kg)  11/08/21 268 lb 9.6 oz (121.8 kg)        Assessment & Plan:   Problem List Items Addressed This Visit       Genitourinary   Overactive bladder - Primary    UA and urine culture reviewed Her symptoms are likely due to overactive bladder/urge incontinence Started oxybutynin for now If she persistent vagina discomfort, can give trial of estradiol cream       Relevant Orders   Urine Culture (Completed)   POCT URINALYSIS DIP (CLINITEK) (Completed)     No orders of the defined types were placed in this encounter.    Lindell Spar, MD

## 2021-11-27 NOTE — Patient Instructions (Signed)
Please continue taking Macrobid for now.  Please take at least 64 ounces of fluid in a day.

## 2021-11-29 ENCOUNTER — Other Ambulatory Visit (HOSPITAL_COMMUNITY): Payer: Self-pay | Admitting: Hematology

## 2021-11-29 ENCOUNTER — Other Ambulatory Visit: Payer: Self-pay | Admitting: Internal Medicine

## 2021-11-29 DIAGNOSIS — N3941 Urge incontinence: Secondary | ICD-10-CM

## 2021-11-29 LAB — URINE CULTURE

## 2021-11-29 MED ORDER — OXYBUTYNIN CHLORIDE ER 10 MG PO TB24: 10.0000 mg | ORAL_TABLET | Freq: Every day | ORAL | 5 refills | Status: DC

## 2021-11-30 ENCOUNTER — Other Ambulatory Visit (HOSPITAL_COMMUNITY): Payer: Self-pay

## 2021-11-30 DIAGNOSIS — N3281 Overactive bladder: Secondary | ICD-10-CM | POA: Insufficient documentation

## 2021-11-30 MED ORDER — POMALIDOMIDE 2 MG PO CAPS: ORAL_CAPSULE | ORAL | 0 refills | Status: DC

## 2021-11-30 NOTE — Telephone Encounter (Signed)
Chart reviewed. Pomalyst refilled per last office note with Dr. Katragadda.  

## 2021-11-30 NOTE — Assessment & Plan Note (Addendum)
UA and urine culture reviewed Her symptoms are likely due to overactive bladder/urge incontinence Started oxybutynin for now If she persistent vagina discomfort, can give trial of estradiol cream

## 2021-12-06 ENCOUNTER — Inpatient Hospital Stay (HOSPITAL_COMMUNITY): Payer: BC Managed Care – PPO

## 2021-12-06 ENCOUNTER — Encounter (HOSPITAL_COMMUNITY): Payer: Self-pay

## 2021-12-06 VITALS — BP 119/69 | HR 73 | Temp 97.5°F | Resp 18 | Wt 267.0 lb

## 2021-12-06 DIAGNOSIS — C9000 Multiple myeloma not having achieved remission: Secondary | ICD-10-CM

## 2021-12-06 DIAGNOSIS — Z5112 Encounter for antineoplastic immunotherapy: Secondary | ICD-10-CM | POA: Diagnosis not present

## 2021-12-06 LAB — COMPREHENSIVE METABOLIC PANEL
ALT: 12 U/L (ref 0–44)
AST: 15 U/L (ref 15–41)
Albumin: 3.2 g/dL — ABNORMAL LOW (ref 3.5–5.0)
Alkaline Phosphatase: 85 U/L (ref 38–126)
Anion gap: 7 (ref 5–15)
BUN: 17 mg/dL (ref 6–20)
CO2: 26 mmol/L (ref 22–32)
Calcium: 8.8 mg/dL — ABNORMAL LOW (ref 8.9–10.3)
Chloride: 106 mmol/L (ref 98–111)
Creatinine, Ser: 1.14 mg/dL — ABNORMAL HIGH (ref 0.44–1.00)
GFR, Estimated: 56 mL/min — ABNORMAL LOW (ref >60)
Glucose, Bld: 178 mg/dL — ABNORMAL HIGH (ref 70–99)
Potassium: 4 mmol/L (ref 3.5–5.1)
Sodium: 139 mmol/L (ref 135–145)
Total Bilirubin: 0.5 mg/dL (ref 0.3–1.2)
Total Protein: 7.5 g/dL (ref 6.5–8.1)

## 2021-12-06 LAB — CBC WITH DIFFERENTIAL/PLATELET
Abs Immature Granulocytes: 0.04 10*3/uL (ref 0.00–0.07)
Basophils Absolute: 0 10*3/uL (ref 0.0–0.1)
Basophils Relative: 1 %
Eosinophils Absolute: 0.1 10*3/uL (ref 0.0–0.5)
Eosinophils Relative: 2 %
HCT: 31.5 % — ABNORMAL LOW (ref 36.0–46.0)
Hemoglobin: 9.9 g/dL — ABNORMAL LOW (ref 12.0–15.0)
Immature Granulocytes: 1 %
Lymphocytes Relative: 34 %
Lymphs Abs: 1.4 10*3/uL (ref 0.7–4.0)
MCH: 28.9 pg (ref 26.0–34.0)
MCHC: 31.4 g/dL (ref 30.0–36.0)
MCV: 92.1 fL (ref 80.0–100.0)
Monocytes Absolute: 0.9 10*3/uL (ref 0.1–1.0)
Monocytes Relative: 22 %
Neutro Abs: 1.6 10*3/uL — ABNORMAL LOW (ref 1.7–7.7)
Neutrophils Relative %: 40 %
Platelets: 288 10*3/uL (ref 150–400)
RBC: 3.42 MIL/uL — ABNORMAL LOW (ref 3.87–5.11)
RDW: 15.5 % (ref 11.5–15.5)
WBC: 4.1 10*3/uL (ref 4.0–10.5)
nRBC: 0 % (ref 0.0–0.2)

## 2021-12-06 LAB — MAGNESIUM: Magnesium: 2 mg/dL (ref 1.7–2.4)

## 2021-12-06 MED ORDER — BORTEZOMIB CHEMO SQ INJECTION 3.5 MG (2.5MG/ML)
1.0400 mg/m2 | Freq: Once | INTRAMUSCULAR | Status: AC
  Administered 2021-12-06: 2.5 mg via SUBCUTANEOUS
  Filled 2021-12-06: qty 1

## 2021-12-06 NOTE — Progress Notes (Signed)
Patient takes own compazine from home.    Patient tolerated Velcade injection with no complaints voiced.  Lab work reviewed.  See MAR for details.  Injection site clean and dry with no bruising or swelling noted.  Patient stable during and after injection.  Band aid applied.  VSS.  Patient left in satisfactory condition with no s/s of distress noted.

## 2021-12-06 NOTE — Patient Instructions (Signed)
Joanna Reid  Discharge Instructions: Thank you for choosing [facility] to provide your oncology and hematology care.  If you have a lab appointment with the [facility], please come in thru the Main Entrance and check in at the main information desk.  Wear comfortable clothing and clothing appropriate for easy access to any Portacath or PICC line.   We strive to give you quality time with your provider. You may need to reschedule your appointment if you arrive late (15 or more minutes).  Arriving late affects you and other patients whose appointments are after yours.  Also, if you miss three or more appointments without notifying the office, you may be dismissed from the clinic at the provider's discretion.      For prescription refill requests, have your pharmacy contact our office and allow 72 hours for refills to be completed.    Today you received the following chemotherapy and/or immunotherapy agents velcade.  Bortezomib injection What is this medication? BORTEZOMIB (bor TEZ oh mib) targets proteins in cancer cells and stops the cancer cells from growing. It treats multiple myeloma and mantle cell lymphoma. This medicine may be used for other purposes; ask your health care provider or pharmacist if you have questions. COMMON BRAND NAME(S): Velcade What should I tell my care team before I take this medication? They need to know if you have any of these conditions: dehydration diabetes (high blood sugar) heart disease liver disease tingling of the fingers or toes or other nerve disorder an unusual or allergic reaction to bortezomib, mannitol, boron, other medicines, foods, dyes, or preservatives pregnant or trying to get pregnant breast-feeding How should I use this medication? This medicine is injected into a vein or under the skin. It is given by a health care provider in a hospital or clinic setting. Talk to your health care provider about the use of  this medicine in children. Special care may be needed. Overdosage: If you think you have taken too much of this medicine contact a poison control center or emergency room at once. NOTE: This medicine is only for you. Do not share this medicine with others. What if I miss a dose? Keep appointments for follow-up doses. It is important not to miss your dose. Call your health care provider if you are unable to keep an appointment. What may interact with this medication? This medicine may interact with the following medications: ketoconazole rifampin This list may not describe all possible interactions. Give your health care provider a list of all the medicines, herbs, non-prescription drugs, or dietary supplements you use. Also tell them if you smoke, drink alcohol, or use illegal drugs. Some items may interact with your medicine. What should I watch for while using this medication? Your condition will be monitored carefully while you are receiving this medicine. You may need blood work done while you are taking this medicine. You may get drowsy or dizzy. Do not drive, use machinery, or do anything that needs mental alertness until you know how this medicine affects you. Do not stand up or sit up quickly, especially if you are an older patient. This reduces the risk of dizzy or fainting spells This medicine may increase your risk of getting an infection. Call your health care provider for advice if you get a fever, chills, sore throat, or other symptoms of a cold or flu. Do not treat yourself. Try to avoid being around people who are sick. Check with your health care provider  if you have severe diarrhea, nausea, and vomiting, or if you sweat a lot. The loss of too much body fluid may make it dangerous for you to take this medicine. Do not become pregnant while taking this medicine or for 7 months after stopping it. Women should inform their health care provider if they wish to become pregnant or think  they might be pregnant. Men should not father a child while taking this medicine and for 4 months after stopping it. There is a potential for serious harm to an unborn child. Talk to your health care provider for more information. Do not breast-feed an infant while taking this medicine or for 2 months after stopping it. This medicine may make it more difficult to get pregnant or father a child. Talk to your health care provider if you are concerned about your fertility. What side effects may I notice from receiving this medication? Side effects that you should report to your doctor or health care professional as soon as possible: allergic reactions (skin rash; itching or hives; swelling of the face, lips, or tongue) bleeding (bloody or black, tarry stools; red or dark brown urine; spitting up blood or brown material that looks like coffee grounds; red spots on the skin; unusual bruising or bleeding from the eye, gums, or nose) blurred vision or changes in vision confusion constipation headache heart failure (trouble breathing; fast, irregular heartbeat; sudden weight gain; swelling of the ankles, feet, hands) infection (fever, chills, cough, sore throat, pain or trouble passing urine) lack or loss of appetite liver injury (dark yellow or brown urine; general ill feeling or flu-like symptoms; loss of appetite, right upper belly pain; yellowing of the eyes or skin) low blood pressure (dizziness; feeling faint or lightheaded, falls; unusually weak or tired) muscle cramps pain, redness, or irritation at site where injected pain, tingling, numbness in the hands or feet seizures trouble breathing unusual bruising or bleeding Side effects that usually do not require medical attention (report to your doctor or health care professional if they continue or are bothersome): diarrhea nausea stomach pain trouble sleeping vomiting This list may not describe all possible side effects. Call your doctor  for medical advice about side effects. You may report side effects to FDA at 1-800-FDA-1088. Where should I keep my medication? This medicine is given in a hospital or clinic. It will not be stored at home. NOTE: This sheet is a summary. It may not cover all possible information. If you have questions about this medicine, talk to your doctor, pharmacist, or health care provider.  2023 Elsevier/Gold Standard (2020-06-16 00:00:00)       To help prevent nausea and vomiting after your treatment, we encourage you to take your nausea medication as directed.  BELOW ARE SYMPTOMS THAT SHOULD BE REPORTED IMMEDIATELY: *FEVER GREATER THAN 100.4 F (38 C) OR HIGHER *CHILLS OR SWEATING *NAUSEA AND VOMITING THAT IS NOT CONTROLLED WITH YOUR NAUSEA MEDICATION *UNUSUAL SHORTNESS OF BREATH *UNUSUAL BRUISING OR BLEEDING *URINARY PROBLEMS (pain or burning when urinating, or frequent urination) *BOWEL PROBLEMS (unusual diarrhea, constipation, pain near the anus) TENDERNESS IN MOUTH AND THROAT WITH OR WITHOUT PRESENCE OF ULCERS (sore throat, sores in mouth, or a toothache) UNUSUAL RASH, SWELLING OR PAIN  UNUSUAL VAGINAL DISCHARGE OR ITCHING   Items with * indicate a potential emergency and should be followed up as soon as possible or go to the Emergency Department if any problems should occur.  Please show the CHEMOTHERAPY ALERT CARD or IMMUNOTHERAPY ALERT CARD at check-in  to the Emergency Department and triage nurse.  Should you have questions after your visit or need to cancel or reschedule your appointment, please contact [facility] [phone]  and follow the prompts.  Office hours are 8:00 a.m. to 4:30 p.m. Monday - Friday. Please note that voicemails left after 4:00 p.m. may not be returned until the following business day.  We are closed weekends and major holidays. You have access to a nurse at all times for urgent questions. Please call the main number to the clinic [phone] and follow  the prompts.  For any non-urgent questions, you may also contact your provider using MyChart. We now offer e-Visits for anyone 23 and older to request care online for non-urgent symptoms. For details visit [email or web address].   Also download the MyChart app! Go to the app store, search "MyChart", open the app, select [facility], and log in with your MyChart username and password.  Due to Covid, a mask is required upon entering the hospital/clinic. If you do not have a mask, one will be given to you upon arrival. For doctor visits, patients may have 1 support person aged 53 or older with them. For treatment visits, patients cannot have anyone with them due to current Covid guidelines and our immunocompromised population.

## 2021-12-19 ENCOUNTER — Other Ambulatory Visit (HOSPITAL_COMMUNITY): Payer: Self-pay | Admitting: Hematology

## 2021-12-19 DIAGNOSIS — C9000 Multiple myeloma not having achieved remission: Secondary | ICD-10-CM

## 2021-12-20 ENCOUNTER — Encounter (HOSPITAL_COMMUNITY): Payer: Self-pay

## 2021-12-20 ENCOUNTER — Inpatient Hospital Stay (HOSPITAL_COMMUNITY): Payer: BC Managed Care – PPO | Attending: Hematology

## 2021-12-20 ENCOUNTER — Inpatient Hospital Stay (HOSPITAL_COMMUNITY): Payer: BC Managed Care – PPO

## 2021-12-20 ENCOUNTER — Encounter (HOSPITAL_COMMUNITY): Payer: Self-pay | Admitting: Hematology

## 2021-12-20 VITALS — BP 97/68 | HR 64 | Temp 97.5°F | Resp 18 | Wt 268.0 lb

## 2021-12-20 DIAGNOSIS — Z79899 Other long term (current) drug therapy: Secondary | ICD-10-CM | POA: Insufficient documentation

## 2021-12-20 DIAGNOSIS — C9000 Multiple myeloma not having achieved remission: Secondary | ICD-10-CM | POA: Diagnosis present

## 2021-12-20 DIAGNOSIS — Z5112 Encounter for antineoplastic immunotherapy: Secondary | ICD-10-CM | POA: Insufficient documentation

## 2021-12-20 LAB — CBC WITH DIFFERENTIAL/PLATELET
Abs Immature Granulocytes: 0.06 10*3/uL (ref 0.00–0.07)
Basophils Absolute: 0.1 10*3/uL (ref 0.0–0.1)
Basophils Relative: 1 %
Eosinophils Absolute: 0.1 10*3/uL (ref 0.0–0.5)
Eosinophils Relative: 1 %
HCT: 30.9 % — ABNORMAL LOW (ref 36.0–46.0)
Hemoglobin: 9.6 g/dL — ABNORMAL LOW (ref 12.0–15.0)
Immature Granulocytes: 1 %
Lymphocytes Relative: 18 %
Lymphs Abs: 1.7 10*3/uL (ref 0.7–4.0)
MCH: 29 pg (ref 26.0–34.0)
MCHC: 31.1 g/dL (ref 30.0–36.0)
MCV: 93.4 fL (ref 80.0–100.0)
Monocytes Absolute: 1.1 10*3/uL — ABNORMAL HIGH (ref 0.1–1.0)
Monocytes Relative: 12 %
Neutro Abs: 6.1 10*3/uL (ref 1.7–7.7)
Neutrophils Relative %: 67 %
Platelets: 235 10*3/uL (ref 150–400)
RBC: 3.31 MIL/uL — ABNORMAL LOW (ref 3.87–5.11)
RDW: 16.5 % — ABNORMAL HIGH (ref 11.5–15.5)
WBC: 9 10*3/uL (ref 4.0–10.5)
nRBC: 0 % (ref 0.0–0.2)

## 2021-12-20 LAB — COMPREHENSIVE METABOLIC PANEL
ALT: 20 U/L (ref 0–44)
AST: 19 U/L (ref 15–41)
Albumin: 3.2 g/dL — ABNORMAL LOW (ref 3.5–5.0)
Alkaline Phosphatase: 96 U/L (ref 38–126)
Anion gap: 10 (ref 5–15)
BUN: 27 mg/dL — ABNORMAL HIGH (ref 6–20)
CO2: 21 mmol/L — ABNORMAL LOW (ref 22–32)
Calcium: 8.9 mg/dL (ref 8.9–10.3)
Chloride: 103 mmol/L (ref 98–111)
Creatinine, Ser: 1.41 mg/dL — ABNORMAL HIGH (ref 0.44–1.00)
GFR, Estimated: 44 mL/min — ABNORMAL LOW (ref >60)
Glucose, Bld: 208 mg/dL — ABNORMAL HIGH (ref 70–99)
Potassium: 4.3 mmol/L (ref 3.5–5.1)
Sodium: 134 mmol/L — ABNORMAL LOW (ref 135–145)
Total Bilirubin: 0.7 mg/dL (ref 0.3–1.2)
Total Protein: 7.4 g/dL (ref 6.5–8.1)

## 2021-12-20 LAB — MAGNESIUM: Magnesium: 2.1 mg/dL (ref 1.7–2.4)

## 2021-12-20 MED ORDER — SODIUM CHLORIDE 0.9 % IV SOLN: INTRAVENOUS | Status: DC

## 2021-12-20 MED ORDER — BORTEZOMIB CHEMO SQ INJECTION 3.5 MG (2.5MG/ML)
1.0400 mg/m2 | Freq: Once | INTRAMUSCULAR | Status: AC
  Administered 2021-12-20: 2.5 mg via SUBCUTANEOUS
  Filled 2021-12-20: qty 1

## 2021-12-20 NOTE — Progress Notes (Signed)
Patient presents today for Velcade patient's Creatinine 1.41 amd BUN 27 Dr. Delton Coombes made aware, received orders for 500 mL of normal saline, patient is okay to receive Velcade today, pharmacy aware. Patient reports taking Compazine at home. Patient tolerated Velcade injection with no complaints voiced. Lab work reviewed. See MAR for details. Injection site clean and dry with no bruising or swelling noted. Patient stable during and after injection. Band aid applied.  Patient tolerated hydration infusion with no complaints voiced. Peripheral IV site clean and dry with good blood return noted before and after infusion. Band aid applied. VSS with discharge and left in satisfactory condition with no s/s of distress noted.

## 2021-12-20 NOTE — Patient Instructions (Signed)
Joanna Reid  Discharge Instructions: Thank you for choosing [facility] to provide your oncology and hematology care.  If you have a lab appointment with the [facility], please come in thru the Main Entrance and check in at the main information desk.  Wear comfortable clothing and clothing appropriate for easy access to any Portacath or PICC line.   We strive to give you quality time with your provider. You may need to reschedule your appointment if you arrive late (15 or more minutes).  Arriving late affects you and other patients whose appointments are after yours.  Also, if you miss three or more appointments without notifying the office, you may be dismissed from the clinic at the provider's discretion.      For prescription refill requests, have your pharmacy contact our office and allow 72 hours for refills to be completed.    Today you received the following chemotherapy and/or immunotherapy agents Velcade, return as scheduled.   To help prevent nausea and vomiting after your treatment, we encourage you to take your nausea medication as directed.  BELOW ARE SYMPTOMS THAT SHOULD BE REPORTED IMMEDIATELY: *FEVER GREATER THAN 100.4 F (38 C) OR HIGHER *CHILLS OR SWEATING *NAUSEA AND VOMITING THAT IS NOT CONTROLLED WITH YOUR NAUSEA MEDICATION *UNUSUAL SHORTNESS OF BREATH *UNUSUAL BRUISING OR BLEEDING *URINARY PROBLEMS (pain or burning when urinating, or frequent urination) *BOWEL PROBLEMS (unusual diarrhea, constipation, pain near the anus) TENDERNESS IN MOUTH AND THROAT WITH OR WITHOUT PRESENCE OF ULCERS (sore throat, sores in mouth, or a toothache) UNUSUAL RASH, SWELLING OR PAIN  UNUSUAL VAGINAL DISCHARGE OR ITCHING   Items with * indicate a potential emergency and should be followed up as soon as possible or go to the Emergency Department if any problems should occur.  Please show the CHEMOTHERAPY ALERT CARD or IMMUNOTHERAPY ALERT CARD at check-in to the  Emergency Department and triage nurse.  Should you have questions after your visit or need to cancel or reschedule your appointment, please contact [facility], [facility], [facility] [phone]  and follow the prompts.  Office hours are 8:00 a.m. to 4:30 p.m. Monday - Friday. Please note that voicemails left after 4:00 p.m. may not be returned until the following business day.  We are closed weekends and major holidays. You have access to a nurse at all times for urgent questions. Please call the main number to the clinic [phone] and follow the prompts.  For any non-urgent questions, you may also contact your provider using MyChart. We now offer e-Visits for anyone 37 and older to request care online for non-urgent symptoms. For details visit [email or web address].   Also download the MyChart app! Go to the app store, search "MyChart", open the app, select [facility], and log in with your MyChart username and password.  Masks are optional in the cancer centers. If you would like for your care team to wear a mask while they are taking care of you, please let them know. For doctor visits, patients may have with them one support person who is at least 57 years old. At this time, visitors are not allowed in the infusion area.

## 2021-12-26 ENCOUNTER — Other Ambulatory Visit (HOSPITAL_COMMUNITY): Payer: Self-pay | Admitting: Physician Assistant

## 2021-12-26 ENCOUNTER — Other Ambulatory Visit (HOSPITAL_COMMUNITY): Payer: Self-pay | Admitting: Hematology

## 2021-12-26 DIAGNOSIS — C9000 Multiple myeloma not having achieved remission: Secondary | ICD-10-CM

## 2021-12-27 ENCOUNTER — Other Ambulatory Visit (HOSPITAL_COMMUNITY): Payer: Self-pay

## 2021-12-27 MED ORDER — POMALIDOMIDE 2 MG PO CAPS: ORAL_CAPSULE | ORAL | 0 refills | Status: DC

## 2021-12-27 NOTE — Telephone Encounter (Signed)
Chart reviewed. Pomalyst refilled per last office note with Dr. Katragadda.  

## 2022-01-03 ENCOUNTER — Inpatient Hospital Stay (HOSPITAL_COMMUNITY): Payer: BC Managed Care – PPO

## 2022-01-03 ENCOUNTER — Other Ambulatory Visit (HOSPITAL_COMMUNITY): Payer: Self-pay | Admitting: *Deleted

## 2022-01-03 ENCOUNTER — Encounter (HOSPITAL_COMMUNITY): Payer: Self-pay

## 2022-01-03 DIAGNOSIS — C9000 Multiple myeloma not having achieved remission: Secondary | ICD-10-CM

## 2022-01-03 DIAGNOSIS — Z5112 Encounter for antineoplastic immunotherapy: Secondary | ICD-10-CM | POA: Diagnosis not present

## 2022-01-03 LAB — LACTATE DEHYDROGENASE: LDH: 114 U/L (ref 98–192)

## 2022-01-03 LAB — CBC WITH DIFFERENTIAL/PLATELET
Abs Immature Granulocytes: 0.02 10*3/uL (ref 0.00–0.07)
Basophils Absolute: 0 10*3/uL (ref 0.0–0.1)
Basophils Relative: 1 %
Eosinophils Absolute: 0.1 10*3/uL (ref 0.0–0.5)
Eosinophils Relative: 2 %
HCT: 26.8 % — ABNORMAL LOW (ref 36.0–46.0)
Hemoglobin: 8.4 g/dL — ABNORMAL LOW (ref 12.0–15.0)
Immature Granulocytes: 1 %
Lymphocytes Relative: 41 %
Lymphs Abs: 1.3 10*3/uL (ref 0.7–4.0)
MCH: 28.1 pg (ref 26.0–34.0)
MCHC: 31.3 g/dL (ref 30.0–36.0)
MCV: 89.6 fL (ref 80.0–100.0)
Monocytes Absolute: 0.6 10*3/uL (ref 0.1–1.0)
Monocytes Relative: 21 %
Neutro Abs: 1.1 10*3/uL — ABNORMAL LOW (ref 1.7–7.7)
Neutrophils Relative %: 34 %
Platelets: 266 10*3/uL (ref 150–400)
RBC: 2.99 MIL/uL — ABNORMAL LOW (ref 3.87–5.11)
RDW: 15.9 % — ABNORMAL HIGH (ref 11.5–15.5)
WBC: 3.1 10*3/uL — ABNORMAL LOW (ref 4.0–10.5)
nRBC: 0 % (ref 0.0–0.2)

## 2022-01-03 LAB — COMPREHENSIVE METABOLIC PANEL
ALT: 11 U/L (ref 0–44)
AST: 14 U/L — ABNORMAL LOW (ref 15–41)
Albumin: 2.9 g/dL — ABNORMAL LOW (ref 3.5–5.0)
Alkaline Phosphatase: 78 U/L (ref 38–126)
Anion gap: 9 (ref 5–15)
BUN: 16 mg/dL (ref 6–20)
CO2: 23 mmol/L (ref 22–32)
Calcium: 8.6 mg/dL — ABNORMAL LOW (ref 8.9–10.3)
Chloride: 104 mmol/L (ref 98–111)
Creatinine, Ser: 1.06 mg/dL — ABNORMAL HIGH (ref 0.44–1.00)
GFR, Estimated: >60 mL/min (ref >60)
Glucose, Bld: 162 mg/dL — ABNORMAL HIGH (ref 70–99)
Potassium: 3.6 mmol/L (ref 3.5–5.1)
Sodium: 136 mmol/L (ref 135–145)
Total Bilirubin: 0.5 mg/dL (ref 0.3–1.2)
Total Protein: 7 g/dL (ref 6.5–8.1)

## 2022-01-03 LAB — MAGNESIUM: Magnesium: 1.8 mg/dL (ref 1.7–2.4)

## 2022-01-03 NOTE — Progress Notes (Signed)
Patient presents today for Velcade. ANC 1.1 and Hemoglobin is 8.4 Patient reports she feels very fatigue like she is walking with cinder blocks on her feet. Some diarrhea and 1-2 episodes of nausea. 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 Dr. Delton Coombes made aware of patient's symptoms. Per Dr. Delton Coombes hold Velcade today and hold pomalyst until next visit with him, echocardiogram ordered. Patient made aware of instructions and additional orders. And discharged in satisfactory condition.

## 2022-01-03 NOTE — Patient Instructions (Signed)
Joanna Reid  Discharge Instructions: Thank you for choosing [facility] to provide your oncology and hematology care.  If you have a lab appointment with the [facility], please come in thru the Main Entrance and check in at the main information desk.  Wear comfortable clothing and clothing appropriate for easy access to any Portacath or PICC line.   We strive to give you quality time with your provider. You may need to reschedule your appointment if you arrive late (15 or more minutes).  Arriving late affects you and other patients whose appointments are after yours.  Also, if you miss three or more appointments without notifying the office, you may be dismissed from the clinic at the provider's discretion.      For prescription refill requests, have your pharmacy contact our office and allow 72 hours for refills to be completed.    Today your Velcade was held. Stop taking Pomalyst until next visit with Dr. Delton Coombes. Have echocardiogram as scheduled. Return as scheduled.   To help prevent nausea and vomiting after your treatment, we encourage you to take your nausea medication as directed.  BELOW ARE SYMPTOMS THAT SHOULD BE REPORTED IMMEDIATELY: *FEVER GREATER THAN 100.4 F (38 C) OR HIGHER *CHILLS OR SWEATING *NAUSEA AND VOMITING THAT IS NOT CONTROLLED WITH YOUR NAUSEA MEDICATION *UNUSUAL SHORTNESS OF BREATH *UNUSUAL BRUISING OR BLEEDING *URINARY PROBLEMS (pain or burning when urinating, or frequent urination) *BOWEL PROBLEMS (unusual diarrhea, constipation, pain near the anus) TENDERNESS IN MOUTH AND THROAT WITH OR WITHOUT PRESENCE OF ULCERS (sore throat, sores in mouth, or a toothache) UNUSUAL RASH, SWELLING OR PAIN  UNUSUAL VAGINAL DISCHARGE OR ITCHING   Items with * indicate a potential emergency and should be followed up as soon as possible or go to the Emergency Department if any problems should occur.  Please show the CHEMOTHERAPY ALERT CARD or  IMMUNOTHERAPY ALERT CARD at check-in to the Emergency Department and triage nurse.  Should you have questions after your visit or need to cancel or reschedule your appointment, please contact [facility] [phone]  and follow the prompts.  Office hours are 8:00 a.m. to 4:30 p.m. Monday - Friday. Please note that voicemails left after 4:00 p.m. may not be returned until the following business day.  We are closed weekends and major holidays. You have access to a nurse at all times for urgent questions. Please call the main number to the clinic [phone] and follow the prompts.  For any non-urgent questions, you may also contact your provider using MyChart. We now offer e-Visits for anyone 25 and older to request care online for non-urgent symptoms. For details visit [email or web address].   Also download the MyChart app! Go to the app store, search "MyChart", open the app, select [facility], and log in with your MyChart username and password.  Masks are optional in the cancer centers. If you would like for your care team to wear a mask while they are taking care of you, please let them know. For doctor visits, patients may have with them one support person who is at least 57 years old. At this time, visitors are not allowed in the infusion area.

## 2022-01-04 ENCOUNTER — Other Ambulatory Visit (HOSPITAL_COMMUNITY): Payer: Self-pay

## 2022-01-05 LAB — KAPPA/LAMBDA LIGHT CHAINS
Kappa free light chain: 102.1 mg/L — ABNORMAL HIGH (ref 3.3–19.4)
Kappa, lambda light chain ratio: 0.25 — ABNORMAL LOW (ref 0.26–1.65)
Lambda free light chains: 412.9 mg/L — ABNORMAL HIGH (ref 5.7–26.3)

## 2022-01-08 LAB — IMMUNOFIXATION ELECTROPHORESIS
IgA: 169 mg/dL (ref 87–352)
IgG (Immunoglobin G), Serum: 1244 mg/dL (ref 586–1602)
IgM (Immunoglobulin M), Srm: 101 mg/dL (ref 26–217)
Total Protein ELP: 6.4 g/dL (ref 6.0–8.5)

## 2022-01-09 LAB — PROTEIN ELECTROPHORESIS, SERUM
A/G Ratio: 0.8 (ref 0.7–1.7)
Albumin ELP: 2.7 g/dL — ABNORMAL LOW (ref 2.9–4.4)
Alpha-1-Globulin: 0.4 g/dL (ref 0.0–0.4)
Alpha-2-Globulin: 0.9 g/dL (ref 0.4–1.0)
Beta Globulin: 0.9 g/dL (ref 0.7–1.3)
Gamma Globulin: 1.3 g/dL (ref 0.4–1.8)
Globulin, Total: 3.6 g/dL (ref 2.2–3.9)
M-Spike, %: 0.2 g/dL — ABNORMAL HIGH
Total Protein ELP: 6.3 g/dL (ref 6.0–8.5)

## 2022-01-16 ENCOUNTER — Ambulatory Visit (HOSPITAL_COMMUNITY)
Admission: RE | Admit: 2022-01-16 | Discharge: 2022-01-16 | Disposition: A | Discharge status: Home / Self Care | Payer: BC Managed Care – PPO | Source: Ambulatory Visit | Attending: Hematology | Admitting: Hematology

## 2022-01-16 DIAGNOSIS — C9000 Multiple myeloma not having achieved remission: Secondary | ICD-10-CM

## 2022-01-16 DIAGNOSIS — I351 Nonrheumatic aortic (valve) insufficiency: Secondary | ICD-10-CM | POA: Diagnosis not present

## 2022-01-16 DIAGNOSIS — I340 Nonrheumatic mitral (valve) insufficiency: Secondary | ICD-10-CM | POA: Diagnosis not present

## 2022-01-16 LAB — ECHOCARDIOGRAM COMPLETE
AR max vel: 2.31 cm2
AV Area VTI: 2.43 cm2
AV Area mean vel: 2.15 cm2
AV Mean grad: 7 mmHg
AV Peak grad: 13.4 mmHg
Ao pk vel: 1.83 m/s
Area-P 1/2: 2.42 cm2
P 1/2 time: 413 msec
S' Lateral: 2.9 cm

## 2022-01-16 NOTE — Progress Notes (Signed)
*  PRELIMINARY RESULTS* Echocardiogram 2D Echocardiogram has been performed.  Joanna Reid 01/16/2022, 12:28 PM

## 2022-01-17 ENCOUNTER — Inpatient Hospital Stay (HOSPITAL_BASED_OUTPATIENT_CLINIC_OR_DEPARTMENT_OTHER): Payer: BC Managed Care – PPO | Admitting: Hematology

## 2022-01-17 ENCOUNTER — Inpatient Hospital Stay (HOSPITAL_COMMUNITY): Payer: BC Managed Care – PPO | Attending: Hematology

## 2022-01-17 ENCOUNTER — Other Ambulatory Visit: Payer: Self-pay | Admitting: Internal Medicine

## 2022-01-17 ENCOUNTER — Inpatient Hospital Stay (HOSPITAL_COMMUNITY): Payer: BC Managed Care – PPO

## 2022-01-17 VITALS — HR 68 | Temp 98.1°F | Resp 18 | Ht 64.0 in | Wt 264.8 lb

## 2022-01-17 DIAGNOSIS — K219 Gastro-esophageal reflux disease without esophagitis: Secondary | ICD-10-CM

## 2022-01-17 DIAGNOSIS — C9000 Multiple myeloma not having achieved remission: Secondary | ICD-10-CM

## 2022-01-17 DIAGNOSIS — Z79899 Other long term (current) drug therapy: Secondary | ICD-10-CM | POA: Insufficient documentation

## 2022-01-17 DIAGNOSIS — Z5112 Encounter for antineoplastic immunotherapy: Secondary | ICD-10-CM | POA: Insufficient documentation

## 2022-01-17 LAB — CBC WITH DIFFERENTIAL/PLATELET
Abs Immature Granulocytes: 0.05 10*3/uL (ref 0.00–0.07)
Basophils Absolute: 0.1 10*3/uL (ref 0.0–0.1)
Basophils Relative: 1 %
Eosinophils Absolute: 0 10*3/uL (ref 0.0–0.5)
Eosinophils Relative: 1 %
HCT: 29 % — ABNORMAL LOW (ref 36.0–46.0)
Hemoglobin: 9 g/dL — ABNORMAL LOW (ref 12.0–15.0)
Immature Granulocytes: 1 %
Lymphocytes Relative: 27 %
Lymphs Abs: 1.4 10*3/uL (ref 0.7–4.0)
MCH: 28.2 pg (ref 26.0–34.0)
MCHC: 31 g/dL (ref 30.0–36.0)
MCV: 90.9 fL (ref 80.0–100.0)
Monocytes Absolute: 0.6 10*3/uL (ref 0.1–1.0)
Monocytes Relative: 12 %
Neutro Abs: 3 10*3/uL (ref 1.7–7.7)
Neutrophils Relative %: 58 %
Platelets: 241 10*3/uL (ref 150–400)
RBC: 3.19 MIL/uL — ABNORMAL LOW (ref 3.87–5.11)
RDW: 16.9 % — ABNORMAL HIGH (ref 11.5–15.5)
WBC: 5.2 10*3/uL (ref 4.0–10.5)
nRBC: 0 % (ref 0.0–0.2)

## 2022-01-17 LAB — COMPREHENSIVE METABOLIC PANEL
ALT: 11 U/L (ref 0–44)
AST: 17 U/L (ref 15–41)
Albumin: 3 g/dL — ABNORMAL LOW (ref 3.5–5.0)
Alkaline Phosphatase: 97 U/L (ref 38–126)
Anion gap: 6 (ref 5–15)
BUN: 19 mg/dL (ref 6–20)
CO2: 23 mmol/L (ref 22–32)
Calcium: 9 mg/dL (ref 8.9–10.3)
Chloride: 106 mmol/L (ref 98–111)
Creatinine, Ser: 0.96 mg/dL (ref 0.44–1.00)
GFR, Estimated: >60 mL/min (ref >60)
Glucose, Bld: 194 mg/dL — ABNORMAL HIGH (ref 70–99)
Potassium: 3.9 mmol/L (ref 3.5–5.1)
Sodium: 135 mmol/L (ref 135–145)
Total Bilirubin: 0.7 mg/dL (ref 0.3–1.2)
Total Protein: 7.1 g/dL (ref 6.5–8.1)

## 2022-01-17 LAB — MAGNESIUM: Magnesium: 1.7 mg/dL (ref 1.7–2.4)

## 2022-01-17 NOTE — Patient Instructions (Signed)
[facility] at [facility] Discharge Instructions   You were seen and examined today by Dr. Delton Coombes.  He reviewed your lab work with you. Your myeloma numbers are continuing to rise. Dr. Raliegh Ip feels like we need to change treatment. We will start treatment with Darzalex injection, dexamethasone, and Kyprolis. The Darzalex injection will be weekly for the first 8 weeks. The Kyprolis you will receive 3 weeks in a row with one week off. We will give you the steroid in the clinic weekly.   We will arrange for you to have a port a cath placed in order to give you the Kyprolis infusions.   Return as scheduled.    Thank you for choosing [facility] at [facility] to provide your oncology and hematology care.  To afford each patient quality time with our provider, please arrive at least 15 minutes before your scheduled appointment time.   If you have a lab appointment with the [facility] please come in thru the Main Entrance and check in at the main information desk.  You need to re-schedule your appointment should you arrive 10 or more minutes late.  We strive to give you quality time with our providers, and arriving late affects you and other patients whose appointments are after yours.  Also, if you no show three or more times for appointments you may be dismissed from the clinic at the providers discretion.     Again, thank you for choosing [facility].  Our hope is that these requests will decrease the amount of time that you wait before being seen by our physicians.       _____________________________________________________________  Should you have questions after your visit to [facility], please contact our office at [phone] and follow the prompts.  Our office hours are 8:00 a.m. and 4:30 p.m. Monday - Friday.  Please note that voicemails left after 4:00 p.m. may not be returned until the following business day.  We  are closed weekends and major holidays.  You do have access to a nurse 24-7, just call the main number to the clinic [phone] and do not press any options, hold on the line and a nurse will answer the phone.    For prescription refill requests, have your pharmacy contact our office and allow 72 hours.    Due to Covid, you will need to wear a mask upon entering the hospital. If you do not have a mask, a mask will be given to you at the Main Entrance upon arrival. For doctor visits, patients may have 1 support person age 51 or older with them. For treatment visits, patients can not have anyone with them due to social distancing guidelines and our immunocompromised population.

## 2022-01-17 NOTE — Progress Notes (Signed)
[place], [place] [address]   CLINIC:  Medical Oncology/Hematology  PCP:  Lindell Spar, MD [address] / [place] [address] [phone]   REASON FOR VISIT:  Follow-up for multiple myeloma  PRIOR THERAPY:  1. RVD x 4 cycles from 11/01/2017 through 01/14/2018. 2. Stem cell transplant on 02/20/2018. 3. Velcade from 06/11/2018 to 12/22/2019.  NGS Results: not done  CURRENT THERAPY: Daratumumab, carfilzomib and dexamethasone  BRIEF ONCOLOGIC HISTORY:  Oncology History  Multiple myeloma ([place])  10/29/2017 Initial Diagnosis   Multiple myeloma ([place])   11/01/2017 - 01/24/2018 Chemotherapy   The patient had dexamethasone (DECADRON) 4 MG tablet, 1 of 1 cycle, Start date: --, End date: -- lenalidomide (REVLIMID) 25 MG capsule, 1 of 1 cycle, Start date: --, End date: -- bortezomib SQ (VELCADE) chemo injection 3 mg, 1.3 mg/m2 = 3 mg, Subcutaneous,  Once, 5 of 5 cycles Administration: 3 mg (11/01/2017), 3 mg (11/08/2017), 3 mg (11/05/2017), 3 mg (11/22/2017), 3 mg (11/12/2017), 3 mg (11/29/2017), 3 mg (11/26/2017), 3 mg (12/03/2017), 3 mg (12/13/2017), 3 mg (12/20/2017), 3 mg (12/17/2017), 3 mg (12/24/2017), 3 mg (01/03/2018), 3 mg (01/10/2018), 3 mg (01/07/2018), 3 mg (01/14/2018), 3 mg (01/24/2018)  for chemotherapy treatment.    06/11/2018 -  Chemotherapy   Patient is on Treatment Plan : MYELOMA MAINTENANCE Bortezomib SQ q 7d x 6 weeks, two weeks off then q 14d       CANCER STAGING:  Cancer Staging  Multiple myeloma ([place]) Staging form: Plasma Cell Myeloma and Plasma Cell Disorders, AJCC 8th Edition - Clinical: No stage assigned - Unsigned - Clinical: No stage assigned - Unsigned   INTERVAL HISTORY:  [name], a 56 y.o. female, returns for routine follow-up and consideration for next cycle of chemotherapy. Tykesha was last seen on 11/22/2021.  Due for cycle #89 of Velcade today.   Overall, she tells me she has been feeling pretty well.    Overall, she will not receive chemo today.    REVIEW OF SYSTEMS:  Review of Systems  Constitutional:  Negative for appetite change and fatigue.  Respiratory:  Positive for shortness of breath (with exertion).   Gastrointestinal:  Positive for constipation, diarrhea, nausea and vomiting.  Musculoskeletal:  Positive for back pain (6/10 lower).  All other systems reviewed and are negative.   PAST MEDICAL/SURGICAL HISTORY:  Past Medical History:  Diagnosis Date   Acid reflux    Allergic rhinitis    Cancer ([place])    multiple myeloma   Diabetes mellitus    type 2   Gout    Gout    H/o COVID-19--- was Positive 09/17/2019, Negative 12/23/19 AND also Neg on  12/26/19 12/26/2019   HBP (high blood pressure)    History of kidney stones    Migraines    Past Surgical History:  Procedure Laterality Date   BREAST CYST EXCISION Left    2009 no visible scar on skin   CESAREAN SECTION     COLONOSCOPY WITH PROPOFOL N/A 12/25/2019   Procedure: COLONOSCOPY WITH PROPOFOL;  Surgeon: Rogene Houston, MD;  [facility];  Service: Endoscopy;  Laterality: N/A;  730   EXTRACORPOREAL SHOCK WAVE LITHOTRIPSY Left 10/10/2017   Procedure: LEFT EXTRACORPOREAL SHOCK WAVE LITHOTRIPSY (ESWL);  Surgeon: Bjorn Loser, MD;  Location: [facility];  Service: Urology;  Laterality: Left;   EYE SURGERY     HEMORRHOID SURGERY N/A 11/19/2012   Procedure: HEMORRHOIDECTOMY;  Surgeon: Elta Guadeloupe  Lowella Petties, MD;  Location: [facility];  Service: General;  Laterality: N/A;   kidney stones  1998   LAPAROSCOPIC UNILATERAL SALPINGO OOPHERECTOMY  05/14/2012   Procedure: LAPAROSCOPIC UNILATERAL SALPINGO OOPHORECTOMY;  Surgeon: [name], MD;  Location: [facility];  Service: Gynecology;  Laterality: Right;  laparoscopic right salpingo-oophorectomy   PARTIAL HYSTERECTOMY     POLYPECTOMY  12/25/2019   Procedure: POLYPECTOMY;  Surgeon: Rogene Houston, MD;  Location: [facility];  Service: Endoscopy;;   TONSILECTOMY, ADENOIDECTOMY,  BILATERAL MYRINGOTOMY AND TUBES     VESICOVAGINAL FISTULA CLOSURE W/ TAH      SOCIAL HISTORY:  Social History   Socioeconomic History   Marital status: Married    Spouse name: [name]   Number of children: 1   Years of education: 12   Highest education level: Some college, no degree  Occupational History    Employer: [employer]  Tobacco Use   Smoking status: Former    Types: Cigarettes    Quit date: 02/27/1999    Years since quitting: 22.9   Smokeless tobacco: Never   Tobacco comments:    socially   Vaping Use   Vaping Use: Never used  Substance and Sexual Activity   Alcohol use: No    Alcohol/week: 0.0 standard drinks of alcohol   Drug use: No   Sexual activity: Yes    Birth control/protection: Surgical    Comment: hyst  Other Topics Concern   Not on file  Social History Narrative   Not on file   Social Determinants of Health   Financial Resource Strain: Low Risk  (08/08/2021)   Overall Financial Resource Strain (CARDIA)    Difficulty of Paying Living Expenses: Not hard at all  Food Insecurity: No Food Insecurity (11/09/2019)   Hunger Vital Sign    Worried About Running Out of Food in the Last Year: Never true    [place] in the Last Year: Never true  Transportation Needs: No Transportation Needs (08/08/2021)   PRAPARE - Hydrologist (Medical): No    Lack of Transportation (Non-Medical): No  Physical Activity: Unknown (08/08/2021)   Exercise Vital Sign    Days of Exercise per Week: Not on file    Minutes of Exercise per Session: 10 min  Stress: No Stress Concern Present (08/08/2021)   [place]    Feeling of Stress : Not at all  Social Connections: [place] (08/08/2021)   Social Connection and Isolation Panel [NHANES]    Frequency of Communication with Friends and Family: More than three times a week    Frequency of Social Gatherings with Friends and Family:  Twice a week    Attends Religious Services: More than 4 times per year    Active Member of [employer] or Organizations: Yes    Attends Music therapist: More than 4 times per year    Marital Status: Married  Human resources officer Violence: Not At Risk (08/08/2021)   Humiliation, Afraid, Rape, and Kick questionnaire    Fear of Current or Ex-Partner: No    Emotionally Abused: No    Physically Abused: No    Sexually Abused: No    FAMILY HISTORY:  Family History  Problem Relation Age of Onset   Arthritis Other    Cancer Other    Diabetes Other    Hypertension Mother    Dementia Mother    Diabetes Father    ALS  Father    Diabetes Brother    Hypertension Brother    Cancer Paternal Aunt    COPD Maternal Grandmother    Cancer Maternal Grandfather    Anesthesia problems Paternal Grandfather     CURRENT MEDICATIONS:  Current Outpatient Medications  Medication Sig Dispense Refill   acyclovir (ZOVIRAX) 400 MG tablet TAKE (1) TABLET BY MOUTH TWICE DAILY. 60 tablet 0   albuterol (VENTOLIN HFA) 108 (90 Base) MCG/ACT inhaler Inhale 2 puffs into the lungs every 4 (four) hours as needed for wheezing or shortness of breath. 1 each 3   allopurinol (ZYLOPRIM) 300 MG tablet TAKE (1) TABLET BY MOUTH ONCE DAILY. 90 tablet 0   amLODipine (NORVASC) 2.5 MG tablet Take 2.5 mg by mouth daily.     aspirin EC 81 MG tablet Take 1 tablet (81 mg total) by mouth daily. 30 tablet 11   bumetanide (BUMEX) 0.5 MG tablet Take 0.5 mg by mouth daily.      carvedilol (COREG) 25 MG tablet Take 1 tablet (25 mg total) by mouth 2 (two) times daily.     diazepam (VALIUM) 5 MG tablet TAKE (1) TABLET BY MOUTH AT BEDTIME. 30 tablet 0   diclofenac Sodium (VOLTAREN) 1 % GEL Apply 2 g topically 4 (four) times daily. 100 g 0   dicyclomine (BENTYL) 20 MG tablet Take 1 tablet (20 mg total) by mouth 2 (two) times daily as needed for spasms. 20 tablet 0   Docosanol 10 % CREA Apply 1 application topically daily as needed (for  nose).      docusate sodium (COLACE) 100 MG capsule Take 100 mg by mouth daily.      erythromycin ophthalmic ointment Place 1 application into the left eye at bedtime. 3.5 g 0   escitalopram (LEXAPRO) 10 MG tablet TAKE (1) TABLET BY MOUTH ONCE DAILY. MAY START WITH 1/2 TABLET FOR 7 DAYS. 90 tablet 0   fluticasone (FLONASE) 50 MCG/ACT nasal spray INSTILL 2 SPRAYS INTO BOTH NOSTRILS DAILY 16 g 4   Lactulose 20 GM/30ML SOLN Take 30 mLs (20 g total) by mouth daily. 450 mL 1   losartan (COZAAR) 100 MG tablet Take 100 mg by mouth daily.     magnesium oxide (MAG-OX) 400 (241.3 Mg) MG tablet Take 1 tablet (400 mg total) by mouth 3 (three) times daily. 90 tablet 3   nitrofurantoin, macrocrystal-monohydrate, (MACROBID) 100 MG capsule TAKE (1) CAPSULE BY MOUTH AT BEDTIME. 90 capsule 0   ondansetron (ZOFRAN) 8 MG tablet TAKE 1 TABLET BY MOUTH EVERY 8 HOURS AS NEEDED. 30 tablet 6   oxybutynin (DITROPAN XL) 10 MG 24 hr tablet Take 1 tablet (10 mg total) by mouth at bedtime. 30 tablet 5   pantoprazole (PROTONIX) 40 MG tablet TAKE (1) TABLET BY MOUTH ONCE DAILY. 90 tablet 0   pomalidomide (POMALYST) 2 MG capsule TAKE 1 CAPSULE BY MOUTH ONCE DAILY FOR 21 DAYS ON AND 7 DAYS OFF 21 capsule 0   pregabalin (LYRICA) 200 MG capsule TAKE (1) CAPSULE BY MOUTH TWICE DAILY. 60 capsule 5   prochlorperazine (COMPAZINE) 10 MG tablet TAKE 1 TABLET BY MOUTH EVERY 6 HOURS AS NEEDED FOR NAUSEA OR VOMITING. 30 tablet 6   Propylene Glycol (SYSTANE BALANCE) 0.6 % SOLN Apply 1 drop to eye daily as needed (dry eye).      rosuvastatin (CRESTOR) 5 MG tablet Take 5 mg by mouth daily.     TRULICITY 8.93 YB/0.1BP SOPN SMARTSIG:0.5 Milliliter(s) SUB-Q Once a Week  valACYclovir (VALTREX) 1000 MG tablet TAKE 2 TABLETS BY MOUTH NOW; THEN 2 12 HOURS LATER. 12 tablet 0   VELCADE 3.5 MG injection RECONSTITUTE EACH VIAL AS DIRECTED. INJECT 2.5 MG UNDER THE SKIN EVERY 14 DAYS. 2 each 2   metFORMIN (GLUCOPHAGE) 500 MG tablet Take 500 mg by mouth  2 (two) times daily.     No current facility-administered medications for this visit.    ALLERGIES:  No Known Allergies  PHYSICAL EXAM:  Performance status (ECOG): 1 - Symptomatic but completely ambulatory  Vitals:   01/17/22 1308  Pulse: 68  Resp: 18  Temp: 98.1 F (36.7 C)  SpO2: 95%   Wt Readings from Last 3 Encounters:  01/17/22 264 lb 12.8 oz (120.1 kg)  01/03/22 263 lb 14.3 oz (119.7 kg)  12/20/21 268 lb (121.6 kg)   Physical Exam Vitals reviewed.  Constitutional:      Appearance: Normal appearance. She is obese.  Cardiovascular:     Rate and Rhythm: Normal rate and regular rhythm.     Pulses: Normal pulses.     Heart sounds: Normal heart sounds.  Pulmonary:     Effort: Pulmonary effort is normal.     Breath sounds: Normal breath sounds.  Neurological:     General: No focal deficit present.     Mental Status: She is alert and oriented to person, place, and time.  Psychiatric:        Mood and Affect: Mood normal.        Behavior: Behavior normal.     LABORATORY DATA:  I have reviewed the labs as listed.     Latest Ref Rng & Units 01/17/2022   12:00 PM 01/03/2022    9:31 AM 12/20/2021    9:41 AM  CBC  WBC 4.0 - 10.5 K/uL 5.2  3.1  9.0   Hemoglobin 12.0 - 15.0 g/dL 9.0  8.4  9.6   Hematocrit 36.0 - 46.0 % 29.0  26.8  30.9   Platelets 150 - 400 K/uL 241  266  235       Latest Ref Rng & Units 01/17/2022   12:00 PM 01/03/2022    9:31 AM 12/20/2021    9:41 AM  CMP  Glucose 70 - 99 mg/dL 194  162  208   BUN 6 - 20 mg/dL _0 Creatinine 0.44 - 1.00 mg/dL 0.96  1.06  1.41   Sodium 135 - 145 mmol/L 135  136  134   Potassium 3.5 - 5.1 mmol/L 3.9  3.6  4.3   Chloride 98 - 111 mmol/L 106  104  103   CO2 22 - 32 mmol/L _1 Calcium 8.9 - 10.3 mg/dL 9.0  8.6  8.9   Total Protein 6.5 - 8.1 g/dL 7.1  7.0  7.4   Total Bilirubin 0.3 - 1.2 mg/dL 0.7  0.5  0.7   Alkaline Phos 38 - 126 U/L 97  78  96   AST 15 - 41 U/L _2 ALT 0 - 44 U/L _3 DIAGNOSTIC IMAGING:  I have independently reviewed the scans and discussed with the patient. ECHOCARDIOGRAM COMPLETE  Result Date: 01/16/2022    ECHOCARDIOGRAM REPORT   Patient Name:   Joanna Reid Date of Exam: 01/16/2022 Medical Rec #:  [id number]        Height:  64.0 in Accession #:    [id number]       Weight:       263.9 lb Date of Birth:  1966/05/21        BSA:          2.201 m Patient Age:    56 years         BP:           142/85 mmHg Patient Gender: F                HR:           82 bpm. Exam Location:  [facility] Procedure: 2D Echo, Cardiac Doppler and Color Doppler Indications:    C90.00 (ICD-10-CM) - Multiple myeloma not having achieved                 remission [facility])  History:        Patient has prior history of Echocardiogram examinations, most                 recent 12/26/2019. Risk Factors:Former Smoker, Diabetes,                 Hypertension and Dyslipidemia. History of CVA with residual                 deficit, LVH (left ventricular hypertrophy), Hx of COVID-19,                 Cancer ([place]) (From Hx).  Sonographer:    Alvino Chapel RCS Referring Phys: [phone] [place]  1. Left ventricular ejection fraction, by estimation, is 65 to 70%. The left ventricle has normal function. There is mild concentric left ventricular hypertrophy. Left ventricular diastolic parameters are indeterminate.  2. Right ventricular systolic function is normal. The right ventricular size is normal. There is normal pulmonary artery systolic pressure.  3. The mitral valve is normal in structure. Mild mitral valve regurgitation.  4. The aortic valve is tricuspid. Aortic valve regurgitation is mild.  5. The inferior vena cava is normal in size with greater than 50% respiratory variability, suggesting right atrial pressure of 3 mmHg. Comparison(s): The left ventricular function is unchanged. FINDINGS  Left Ventricle: Left ventricular ejection fraction, by estimation, is 65 to 70%. The  left ventricle has normal function. The left ventricular internal cavity size was normal in size. There is mild concentric left ventricular hypertrophy. Left ventricular diastolic parameters are indeterminate. Right Ventricle: The right ventricular size is normal. Right vetricular wall thickness was not assessed. Right ventricular systolic function is normal. There is normal pulmonary artery systolic pressure. The tricuspid regurgitant velocity is 1.82 m/s, and with an assumed right atrial pressure of 3 mmHg, the estimated right ventricular systolic pressure is 35.3 mmHg. Left Atrium: Left atrial size was normal in size. Right Atrium: Right atrial size was normal in size. Pericardium: There is no evidence of pericardial effusion. Mitral Valve: The mitral valve is normal in structure. Mild mitral valve regurgitation. Tricuspid Valve: The tricuspid valve is normal in structure. Tricuspid valve regurgitation is trivial. Aortic Valve: The aortic valve is tricuspid. Aortic valve regurgitation is mild. Aortic regurgitation PHT measures 413 msec. Aortic valve mean gradient measures 7.0 mmHg. Aortic valve peak gradient measures 13.4 mmHg. Aortic valve area, by VTI measures 2.43 cm. Pulmonic Valve: The pulmonic valve was normal in structure. Pulmonic valve regurgitation is not visualized. Aorta: The aortic root is normal in size and structure. Venous: The inferior vena cava is normal  in size with greater than 50% respiratory variability, suggesting right atrial pressure of 3 mmHg. IAS/Shunts: No atrial level shunt detected by color flow Doppler.  LEFT VENTRICLE PLAX 2D LVIDd:         5.10 cm   Diastology LVIDs:         2.90 cm   LV e' medial:    5.22 cm/s LV PW:         1.40 cm   LV E/e' medial:  16.4 LV IVS:        1.40 cm   LV e' lateral:   7.18 cm/s LVOT diam:     2.20 cm   LV E/e' lateral: 11.9 LV SV:         90 LV SV Index:   41 LVOT Area:     3.80 cm  RIGHT VENTRICLE RV S prime:     20.10 cm/s TAPSE (M-mode): 2.1 cm  LEFT ATRIUM             Index        RIGHT ATRIUM           Index LA diam:        4.20 cm 1.91 cm/m   RA Area:     16.10 cm LA Vol (A2C):   85.0 ml 38.62 ml/m  RA Volume:   42.80 ml  19.45 ml/m LA Vol (A4C):   76.6 ml 34.80 ml/m LA Biplane Vol: 84.0 ml 38.16 ml/m  AORTIC VALVE AV Area (Vmax):    2.31 cm AV Area (Vmean):   2.15 cm AV Area (VTI):     2.43 cm AV Vmax:           183.00 cm/s AV Vmean:          115.000 cm/s AV VTI:            0.371 m AV Peak Grad:      13.4 mmHg AV Mean Grad:      7.0 mmHg LVOT Vmax:         111.00 cm/s LVOT Vmean:        65.100 cm/s LVOT VTI:          0.237 m LVOT/AV VTI ratio: 0.64 AI PHT:            413 msec  AORTA Ao Root diam: 3.70 cm MITRAL VALVE                TRICUSPID VALVE MV Area (PHT): 2.42 cm     TR Peak grad:   13.2 mmHg MV Decel Time: 313 msec     TR Vmax:        182.00 cm/s MV E velocity: 85.70 cm/s MV A velocity: 103.00 cm/s  SHUNTS MV E/A ratio:  0.83         Systemic VTI:  0.24 m                             Systemic Diam: 2.20 cm Dorris Carnes MD Electronically signed by Dorris Carnes MD Signature Date/Time: 01/16/2022/5:49:43 PM    Final      ASSESSMENT:  1.  IgG lambda multiple myeloma, stage III, del 17 p: -4 cycles of RVD from 11/01/2017 through 01/14/2018. -St[facility] on [facility]/20/[facility]. -Maintenance Velcade every 2 weeks and Revlimid 10 mg 3 weeks on/1 week off started on 07/29/2018. -Myeloma panel from 12/08/2018 shows M spike not observed.  Kappa light  chains of 32.1 with ratio of 1.64.  Immunofixation was negative. 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  MRI of the brain on 12/27/2019 shows acute ischemic nonhemorrhagic left MCA territory infarct involving left parietal lobe, corresponding with perfusion deficit on CT scan angiogram.  No associated hemorrhage or mass-effect.  Additional few scattered punctate acute ischemic nonhemorrhagic infarcts involving bilateral frontal and parietal lobes as well as left cerebellum. - Revlimid was on hold  since June 2021 due to potential for contributing to CVA. - Velcade dose was reduced to 1 mg per metered square on 12/28/2020 due to worsening neuropathy in the feet.,  Pomalidomide 2 mg 3 weeks on/1 week off started around 09/20/2021, discontinued on 01/17/2022 due to progression. - Daratumumab, carfilzomib and dexamethasone (DKd) started on   2.  CVA with aphasia: -We held her myeloma treatments since CVA with aphasia on 12/26/2019. -Her aphasia is improving.   PLAN:  1.  IgG lambda multiple myeloma, stage III, del 17 p: - We reviewed labs from 01/03/2022.  M spike has resurfaced with 0.2 g.  Lambda light chains increased to 412 1 ratio decreased to 0.25.  Immunofixation shows IgG lambda. - Reviewed labs today which showed normal creatinine and calcium.  CBC was grossly normal with hemoglobin 9.0. - She has relapsed at this time.  I have recommended change in treatment therapy. - We will switch her to daratumumab, carfilzomib and dexamethasone regimen.  We will give carfilzomib on once weekly dose (20/70 mg/m2). - Reviewed echocardiogram (01/16/2022): EF 65-70%. - Discussed the regimen and side effects in detail. - We will start dexamethasone 20 mg weekly as she is concerned about weight gain.  We will increase if inadequate response after first cycle. - She will likely start her first cycle next week.  I will see her with cycle 2.   2.  Hypomagnesemia: - Continue magnesium 3 times daily.  Magnesium is 1.7.   3.  Bone strengthening agents: - She is getting Zometa every 12 weeks.  We will switch her to every 4 weeks at next visit.   4.  Peripheral neuropathy: - Continue Lyrica 200 mg twice daily.   5.  ID prophylaxis: - Continue acyclovir twice daily.   6.  Diabetes: - Continue metformin daily and Trulicity weekly.   7.  Recurrent UTIs: - Continue Macrobid 100 mg daily for prophylaxis.   Orders placed this encounter:  No orders of the defined types were placed in this  encounter.    Derek Jack, MD [place] [phone]   I, Thana Ates, am acting as a scribe for Dr. Derek Jack.  I, Derek Jack MD, have reviewed the above documentation for accuracy and completeness, and I agree with the above.

## 2022-01-17 NOTE — Progress Notes (Signed)
DISCONTINUE OFF PATHWAY REGIMEN - Multiple Myeloma and Other Plasma Cell Dyscrasias   [id number]:Bortezomib Maintenance (SubQ) q2weeks:   A cycle is every 2 weeks:     Bortezomib   **Always confirm dose/schedule in your pharmacy ordering system**  REASON: Disease Progression PRIOR TREATMENT: Off Pathway: Bortezomib Maintenance (SubQ) q2weeks TREATMENT RESPONSE: Progressive Disease (PD)  START ON PATHWAY REGIMEN - Multiple Myeloma and Other Plasma Cell Dyscrasias     Cycle 1: A cycle is 28 days:     Dexamethasone      Carfilzomib      Carfilzomib      Daratumumab and hyaluronidase-fihj    Cycle 2: A cycle is 28 days:     Dexamethasone      Carfilzomib      Daratumumab and hyaluronidase-fihj    Cycles 3 through 6: A cycle is every 28 days:     Dexamethasone      Dexamethasone      Carfilzomib      Daratumumab and hyaluronidase-fihj    Cycles 7 and beyond: A cycle is every 28 days:     Dexamethasone      Dexamethasone      Carfilzomib      Daratumumab and hyaluronidase-fihj   **Always confirm dose/schedule in your pharmacy ordering system**  Patient Characteristics: Multiple Myeloma, Relapsed / Refractory, Second through Fourth Lines of Therapy, Fit or Candidate for Triplet Therapy, Lenalidomide-Refractory or Lenalidomide-based Regimen Not Preferred, Candidate for Anti-CD38 Antibody Disease Classification: Multiple Myeloma R-ISS Staging: III Therapeutic Status: Relapsed Line of Therapy: Third Line Anti-CD38 Antibody Candidacy: Candidate for Anti-CD38 Antibody Lenalidomide-based Regimen Preference/Candidacy: Lenalidomide-Refractory Intent of Therapy: Non-Curative / Palliative Intent, Discussed with Patient

## 2022-01-17 NOTE — Progress Notes (Signed)
No Velcade today per Adonis Huguenin RN/Dr.Katragadda.  Treatment being changed, to begin new treatment next week.

## 2022-01-18 ENCOUNTER — Other Ambulatory Visit: Payer: Self-pay

## 2022-01-18 ENCOUNTER — Encounter (HOSPITAL_COMMUNITY): Payer: Self-pay

## 2022-01-18 ENCOUNTER — Other Ambulatory Visit: Payer: Self-pay | Admitting: Student

## 2022-01-18 ENCOUNTER — Ambulatory Visit (HOSPITAL_COMMUNITY)
Admission: RE | Admit: 2022-01-18 | Discharge: 2022-01-18 | Disposition: A | Discharge status: Home / Self Care | Payer: BC Managed Care – PPO | Source: Ambulatory Visit | Attending: Hematology | Admitting: Hematology

## 2022-01-18 DIAGNOSIS — Z7985 Long-term (current) use of injectable non-insulin antidiabetic drugs: Secondary | ICD-10-CM | POA: Diagnosis not present

## 2022-01-18 DIAGNOSIS — Z8673 Personal history of transient ischemic attack (TIA), and cerebral infarction without residual deficits: Secondary | ICD-10-CM | POA: Diagnosis not present

## 2022-01-18 DIAGNOSIS — E1169 Type 2 diabetes mellitus with other specified complication: Secondary | ICD-10-CM

## 2022-01-18 DIAGNOSIS — Z87891 Personal history of nicotine dependence: Secondary | ICD-10-CM | POA: Insufficient documentation

## 2022-01-18 DIAGNOSIS — M109 Gout, unspecified: Secondary | ICD-10-CM | POA: Diagnosis not present

## 2022-01-18 DIAGNOSIS — Z7984 Long term (current) use of oral hypoglycemic drugs: Secondary | ICD-10-CM | POA: Insufficient documentation

## 2022-01-18 DIAGNOSIS — E119 Type 2 diabetes mellitus without complications: Secondary | ICD-10-CM | POA: Insufficient documentation

## 2022-01-18 DIAGNOSIS — I10 Essential (primary) hypertension: Secondary | ICD-10-CM | POA: Insufficient documentation

## 2022-01-18 DIAGNOSIS — C9000 Multiple myeloma not having achieved remission: Secondary | ICD-10-CM | POA: Insufficient documentation

## 2022-01-18 HISTORY — PX: IR IMAGING GUIDED PORT INSERTION: IMG5740

## 2022-01-18 LAB — GLUCOSE, CAPILLARY: Glucose-Capillary: 129 mg/dL — ABNORMAL HIGH (ref 70–99)

## 2022-01-18 MED ORDER — MIDAZOLAM HCL 2 MG/2ML IJ SOLN
INTRAMUSCULAR | Status: AC | PRN
  Administered 2022-01-18 (×2): 1 mg via INTRAVENOUS

## 2022-01-18 MED ORDER — FENTANYL CITRATE (PF) 100 MCG/2ML IJ SOLN
INTRAMUSCULAR | Status: AC
  Filled 2022-01-18: qty 2

## 2022-01-18 MED ORDER — LIDOCAINE HCL (PF) 1 % IJ SOLN
INTRAMUSCULAR | Status: AC | PRN
  Administered 2022-01-18: 15 mL

## 2022-01-18 MED ORDER — HEPARIN SOD (PORK) LOCK FLUSH 100 UNIT/ML IV SOLN
INTRAVENOUS | Status: AC
  Administered 2022-01-18: 500 [IU]
  Filled 2022-01-18: qty 5

## 2022-01-18 MED ORDER — LIDOCAINE-EPINEPHRINE 1 %-1:100000 IJ SOLN
INTRAMUSCULAR | Status: AC
  Filled 2022-01-18: qty 1

## 2022-01-18 MED ORDER — MIDAZOLAM HCL 2 MG/2ML IJ SOLN
INTRAMUSCULAR | Status: AC
  Filled 2022-01-18: qty 2

## 2022-01-18 MED ORDER — SODIUM CHLORIDE 0.9 % IV SOLN: INTRAVENOUS | Status: DC

## 2022-01-18 MED ORDER — LIDOCAINE HCL 1 % IJ SOLN
INTRAMUSCULAR | Status: AC
  Filled 2022-01-18: qty 20

## 2022-01-18 MED ORDER — FENTANYL CITRATE (PF) 100 MCG/2ML IJ SOLN
INTRAMUSCULAR | Status: AC | PRN
  Administered 2022-01-18: 25 ug via INTRAVENOUS
  Administered 2022-01-18: 50 ug via INTRAVENOUS

## 2022-01-18 NOTE — Progress Notes (Signed)
Patient and husband was given discharge instructions. Both verbalized understanding. 

## 2022-01-18 NOTE — Procedures (Signed)
Interventional Radiology Procedure Note  Procedure: Single Lumen Power Port Placement    Access:  Right IJ vein.  Findings: Catheter tip positioned at SVC/RA junction. Port is ready for immediate use.   Complications: None  EBL: < 10 mL  Recommendations:  - Ok to shower in 24 hours - Do not submerge for 7 days - Routine line care   Irelynn Schermerhorn T. Rosena Bartle, M.D Pager:  [phone]   

## 2022-01-18 NOTE — H&P (Signed)
Chief Complaint: Patient was seen in consultation today for multiple myeloma  at the request of Katragadda,Sreedhar  Referring Physician(s): Katragadda,Sreedhar  Supervising Physician: Aletta Edouard  Patient Status: [facility] - Out-pt  History of Present Illness: Joanna Reid is a 56 y.o. female with PMH of  CVA, DM II, gout, HTN, migraines and multiple myeloma. Pt was diagnosed 10/29/2017 with multiple myeloma. Pt started PO chemotherapy in 2019 for MM but therapy was stopped after CVA in June 2021. Treatment resumed June 2022. Pt was referred to IR for tunneled catheter with port placement to start IV chemotherapy.  Past Medical History:  Diagnosis Date   Acid reflux    Allergic rhinitis    Cancer ([place])    multiple myeloma   Diabetes mellitus    type 2   Gout    Gout    H/o COVID-19--- was Positive 09/17/2019, Negative 12/23/19 AND also Neg on  12/26/19 12/26/2019   HBP (high blood pressure)    History of kidney stones    Migraines     Past Surgical History:  Procedure Laterality Date   BREAST CYST EXCISION Left    2009 no visible scar on skin   CESAREAN SECTION     COLONOSCOPY WITH PROPOFOL N/A 12/25/2019   Procedure: COLONOSCOPY WITH PROPOFOL;  Surgeon: Rogene Houston, MD;  Location: [facility];  Service: Endoscopy;  Laterality: N/A;  730   EXTRACORPOREAL SHOCK WAVE LITHOTRIPSY Left 10/10/2017   Procedure: LEFT EXTRACORPOREAL SHOCK WAVE LITHOTRIPSY (ESWL);  Surgeon: Bjorn Loser, MD;  Location: [facility];  Service: Urology;  Laterality: Left;   EYE SURGERY     HEMORRHOID SURGERY N/A 11/19/2012   Procedure: HEMORRHOIDECTOMY;  Surgeon: Jamesetta So, MD;  Location: [facility];  Service: General;  Laterality: N/A;   kidney stones  1998   LAPAROSCOPIC UNILATERAL SALPINGO OOPHERECTOMY  05/14/2012   Procedure: LAPAROSCOPIC UNILATERAL SALPINGO OOPHORECTOMY;  Surgeon: [name], MD;  Location: [facility];  Service: Gynecology;  Laterality: Right;  laparoscopic right  salpingo-oophorectomy   PARTIAL HYSTERECTOMY     POLYPECTOMY  12/25/2019   Procedure: POLYPECTOMY;  Surgeon: Rogene Houston, MD;  Location: [facility];  Service: Endoscopy;;   TONSILECTOMY, ADENOIDECTOMY, BILATERAL MYRINGOTOMY AND TUBES     VESICOVAGINAL FISTULA CLOSURE W/ TAH      Allergies: Patient has no known allergies.  Medications: Prior to Admission medications   Medication Sig Start Date End Date Taking? Authorizing Provider  acyclovir (ZOVIRAX) 400 MG tablet TAKE (1) TABLET BY MOUTH TWICE DAILY. 12/26/21   Harriett Rush, PA-C  albuterol (VENTOLIN HFA) 108 (90 Base) MCG/ACT inhaler Inhale 2 puffs into the lungs every 4 (four) hours as needed for wheezing or shortness of breath. 11/30/20   Lovena Le, Malena M, DO  allopurinol (ZYLOPRIM) 300 MG tablet TAKE (1) TABLET BY MOUTH ONCE DAILY. 10/27/21   Lindell Spar, MD  amLODipine (NORVASC) 2.5 MG tablet Take 2.5 mg by mouth daily. 01/27/21   [provider]  aspirin EC 81 MG tablet Take 1 tablet (81 mg total) by mouth daily. 12/26/19   Rehman, Mechele Dawley, MD  bumetanide (BUMEX) 0.5 MG tablet Take 0.5 mg by mouth daily.  06/30/19   [provider]  carvedilol (COREG) 25 MG tablet Take 1 tablet (25 mg total) by mouth 2 (two) times daily. 12/29/19   Thurnell Lose, MD  diazepam (VALIUM) 5 MG tablet TAKE (1) TABLET BY MOUTH AT BEDTIME. 11/21/21   Lindell Spar, MD  diclofenac Sodium (VOLTAREN) 1 % GEL Apply 2 g topically 4 (four) times daily. 05/01/21   Caccavale, Sophia, PA-C  dicyclomine (BENTYL) 20 MG tablet Take 1 tablet (20 mg total) by mouth 2 (two) times daily as needed for spasms. 05/01/21   Caccavale, Sophia, PA-C  Docosanol 10 % CREA Apply 1 application topically daily as needed (for nose).     [provider]  docusate sodium (COLACE) 100 MG capsule Take 100 mg by mouth daily.     [provider]  erythromycin ophthalmic ointment Place 1 application into the left eye at bedtime. 08/07/21    Lindell Spar, MD  escitalopram (LEXAPRO) 10 MG tablet TAKE (1) TABLET BY MOUTH ONCE DAILY. MAY START WITH 1/2 TABLET FOR 7 DAYS. 10/27/21   Lindell Spar, MD  fluticasone [facility]) 50 MCG/ACT nasal spray INSTILL 2 SPRAYS INTO BOTH NOSTRILS DAILY 06/28/21   Lindell Spar, MD  Lactulose 20 GM/30ML SOLN Take 30 mLs (20 g total) by mouth daily. 09/27/21   Derek Jack, MD  losartan (COZAAR) 100 MG tablet Take 100 mg by mouth daily.    [provider]  magnesium oxide (MAG-OX) 400 (241.3 Mg) MG tablet Take 1 tablet (400 mg total) by mouth 3 (three) times daily. 10/02/18   Derek Jack, MD  metFORMIN (GLUCOPHAGE) 500 MG tablet Take 500 mg by mouth 2 (two) times daily. 11/16/21 12/16/21  [provider]  nitrofurantoin, macrocrystal-monohydrate, (MACROBID) 100 MG capsule TAKE (1) CAPSULE BY MOUTH AT BEDTIME. 08/30/21   Derek Jack, MD  ondansetron (ZOFRAN) 8 MG tablet TAKE 1 TABLET BY MOUTH EVERY 8 HOURS AS NEEDED. 08/03/20   Derek Jack, MD  oxybutynin (DITROPAN XL) 10 MG 24 hr tablet Take 1 tablet (10 mg total) by mouth at bedtime. 11/29/21   Lindell Spar, MD  pantoprazole (PROTONIX) 40 MG tablet TAKE (1) TABLET BY MOUTH ONCE DAILY. 01/17/22   Lindell Spar, MD  pomalidomide (POMALYST) 2 MG capsule TAKE 1 CAPSULE BY MOUTH ONCE DAILY FOR 21 DAYS ON AND 7 DAYS OFF 12/27/21   Derek Jack, MD  pregabalin (LYRICA) 200 MG capsule TAKE (1) CAPSULE BY MOUTH TWICE DAILY. 07/27/21   Derek Jack, MD  prochlorperazine (COMPAZINE) 10 MG tablet TAKE 1 TABLET BY MOUTH EVERY 6 HOURS AS NEEDED FOR NAUSEA OR VOMITING. 08/03/20   Derek Jack, MD  Propylene Glycol (SYSTANE BALANCE) 0.6 % SOLN Apply 1 drop to eye daily as needed (dry eye).     [provider]  rosuvastatin (CRESTOR) 5 MG tablet Take 5 mg by mouth daily. 10/17/20   [provider]  TRULICITY 3.01 SW/1.0XN SOPN SMARTSIG:0.5 Milliliter(s) SUB-Q Once a Week 12/19/21    [provider]  valACYclovir (VALTREX) 1000 MG tablet TAKE 2 TABLETS BY MOUTH NOW; THEN 2 12 HOURS LATER. 12/19/21   Derek Jack, MD  VELCADE 3.5 MG injection RECONSTITUTE EACH VIAL AS DIRECTED. INJECT 2.5 MG UNDER THE SKIN EVERY 14 DAYS. 10/23/21   Derek Jack, MD     Family History  Problem Relation Age of Onset   Arthritis Other    Cancer Other    Diabetes Other    Hypertension Mother    Dementia Mother    Diabetes Father    ALS Father    Diabetes Brother    Hypertension Brother    Cancer Paternal Aunt    COPD Maternal Grandmother    Cancer Maternal Grandfather    Anesthesia problems Paternal Grandfather     Social  History   Socioeconomic History   Marital status: Married    Spouse name: [name]   Number of children: 1   Years of education: 12   Highest education level: Some college, no degree  Occupational History    Employer: [employer]  Tobacco Use   Smoking status: Former    Types: Cigarettes    Quit date: 02/27/1999    Years since quitting: 22.9   Smokeless tobacco: Never   Tobacco comments:    socially   Vaping Use   Vaping Use: Never used  Substance and Sexual Activity   Alcohol use: No    Alcohol/week: 0.0 standard drinks of alcohol   Drug use: No   Sexual activity: Yes    Birth control/protection: Surgical    Comment: hyst  Other Topics Concern   Not on file  Social History Narrative   Not on file   Social Determinants of Health   Financial Resource Strain: Low Risk  (08/08/2021)   Overall Financial Resource Strain (CARDIA)    Difficulty of Paying Living Expenses: Not hard at all  Food Insecurity: No Food Insecurity (11/09/2019)   Hunger Vital Sign    Worried About Running Out of Food in the Last Year: Never true    Ran Out of Food in the Last Year: Never true  Transportation Needs: No Transportation Needs (08/08/2021)   PRAPARE - Hydrologist (Medical): No    Lack of Transportation (Non-Medical):  No  Physical Activity: Unknown (08/08/2021)   Exercise Vital Sign    Days of Exercise per Week: Not on file    Minutes of Exercise per Session: 10 min  Stress: No Stress Concern Present (08/08/2021)   [place]    Feeling of Stress : Not at all  Social Connections: [place] (08/08/2021)   Social Connection and Isolation Panel [NHANES]    Frequency of Communication with Friends and Family: More than three times a week    Frequency of Social Gatherings with Friends and Family: Twice a week    Attends Religious Services: More than 4 times per year    Active Member of [employer] or Organizations: Yes    Attends Music therapist: More than 4 times per year    Marital Status: Married    Review of Systems: A 12 point ROS discussed and pertinent positives are indicated in the HPI above.  All other systems are negative.  Review of Systems  Constitutional:  Positive for fatigue. Negative for appetite change, chills and fever.  Respiratory:  Positive for shortness of breath.   Cardiovascular:  Negative for chest pain.  Gastrointestinal:  Negative for abdominal pain, nausea and vomiting.  Genitourinary:  Negative for hematuria.  Neurological:  Negative for dizziness, weakness and headaches.    Vital Signs: BP 127/83   Pulse 66   Temp 99.5 F (37.5 C) (Oral)   Resp 18   Ht _0  (1.626 m)   Wt 264 lb (119.7 kg)   SpO2 96%   BMI 45.32 kg/m     Physical Exam Vitals reviewed.  Constitutional:      General: She is not in acute distress.    Appearance: Normal appearance. She is not ill-appearing.  HENT:     Head: Normocephalic and atraumatic.     Mouth/Throat:     Mouth: Mucous membranes are dry.     Pharynx: Oropharynx is clear.  Eyes:  Extraocular Movements: Extraocular movements intact.     Pupils: Pupils are equal, round, and reactive to light.  Cardiovascular:     Rate and Rhythm: Normal  rate and regular rhythm.     Pulses: Normal pulses.     Heart sounds: Normal heart sounds. No murmur heard. Pulmonary:     Effort: Pulmonary effort is normal. No respiratory distress.     Breath sounds: Normal breath sounds.  Abdominal:     General: Bowel sounds are normal. There is no distension.     Palpations: Abdomen is soft.     Tenderness: There is no abdominal tenderness. There is no guarding.  Musculoskeletal:     Right lower leg: Edema present.     Left lower leg: Edema present.  Skin:    General: Skin is warm and dry.  Neurological:     Mental Status: She is alert and oriented to person, place, and time.  Psychiatric:        Mood and Affect: Mood normal.        Behavior: Behavior normal.        Thought Content: Thought content normal.        Judgment: Judgment normal.     Imaging: ECHOCARDIOGRAM COMPLETE  Result Date: 01/16/2022    ECHOCARDIOGRAM REPORT   Patient Name:   Joanna Reid Date of Exam: 01/16/2022 Medical Rec #:  [id number]        Height:       64.0 in Accession #:    [id number]       Weight:       263.9 lb Date of Birth:  October 27, 1965        BSA:          2.201 m Patient Age:    6 years         BP:           142/85 mmHg Patient Gender: F                HR:           82 bpm. Exam Location:  [facility] Procedure: 2D Echo, Cardiac Doppler and Color Doppler Indications:    C90.00 (ICD-10-CM) - Multiple myeloma not having achieved                 remission ([place])  History:        Patient has prior history of Echocardiogram examinations, most                 recent 12/26/2019. Risk Factors:Former Smoker, Diabetes,                 Hypertension and Dyslipidemia. History of CVA with residual                 deficit, LVH (left ventricular hypertrophy), Hx of COVID-19,                 Cancer ([place]) (From Hx).  Sonographer:    Alvino Chapel RCS Referring Phys: [phone] [employer]  1. Left ventricular ejection fraction, by estimation, is 65 to 70%. The left  ventricle has normal function. There is mild concentric left ventricular hypertrophy. Left ventricular diastolic parameters are indeterminate.  2. Right ventricular systolic function is normal. The right ventricular size is normal. There is normal pulmonary artery systolic pressure.  3. The mitral valve is normal in structure. Mild mitral valve regurgitation.  4. The aortic valve is tricuspid. Aortic valve regurgitation  is mild.  5. The inferior vena cava is normal in size with greater than 50% respiratory variability, suggesting right atrial pressure of 3 mmHg. Comparison(s): The left ventricular function is unchanged. FINDINGS  Left Ventricle: Left ventricular ejection fraction, by estimation, is 65 to 70%. The left ventricle has normal function. The left ventricular internal cavity size was normal in size. There is mild concentric left ventricular hypertrophy. Left ventricular diastolic parameters are indeterminate. Right Ventricle: The right ventricular size is normal. Right vetricular wall thickness was not assessed. Right ventricular systolic function is normal. There is normal pulmonary artery systolic pressure. The tricuspid regurgitant velocity is 1.82 m/s, and with an assumed right atrial pressure of 3 mmHg, the estimated right ventricular systolic pressure is 84.6 mmHg. Left Atrium: Left atrial size was normal in size. Right Atrium: Right atrial size was normal in size. Pericardium: There is no evidence of pericardial effusion. Mitral Valve: The mitral valve is normal in structure. Mild mitral valve regurgitation. Tricuspid Valve: The tricuspid valve is normal in structure. Tricuspid valve regurgitation is trivial. Aortic Valve: The aortic valve is tricuspid. Aortic valve regurgitation is mild. Aortic regurgitation PHT measures 413 msec. Aortic valve mean gradient measures 7.0 mmHg. Aortic valve peak gradient measures 13.4 mmHg. Aortic valve area, by VTI measures 2.43 cm. Pulmonic Valve: The pulmonic  valve was normal in structure. Pulmonic valve regurgitation is not visualized. Aorta: The aortic root is normal in size and structure. Venous: The inferior vena cava is normal in size with greater than 50% respiratory variability, suggesting right atrial pressure of 3 mmHg. IAS/Shunts: No atrial level shunt detected by color flow Doppler.  LEFT VENTRICLE PLAX 2D LVIDd:         5.10 cm   Diastology LVIDs:         2.90 cm   LV e' medial:    5.22 cm/s LV PW:         1.40 cm   LV E/e' medial:  16.4 LV IVS:        1.40 cm   LV e' lateral:   7.18 cm/s LVOT diam:     2.20 cm   LV E/e' lateral: 11.9 LV SV:         90 LV SV Index:   41 LVOT Area:     3.80 cm  RIGHT VENTRICLE RV S prime:     20.10 cm/s TAPSE (M-mode): 2.1 cm LEFT ATRIUM             Index        RIGHT ATRIUM           Index LA diam:        4.20 cm 1.91 cm/m   RA Area:     16.10 cm LA Vol (A2C):   85.0 ml 38.62 ml/m  RA Volume:   42.80 ml  19.45 ml/m LA Vol (A4C):   76.6 ml 34.80 ml/m LA Biplane Vol: 84.0 ml 38.16 ml/m  AORTIC VALVE AV Area (Vmax):    2.31 cm AV Area (Vmean):   2.15 cm AV Area (VTI):     2.43 cm AV Vmax:           183.00 cm/s AV Vmean:          115.000 cm/s AV VTI:            0.371 m AV Peak Grad:      13.4 mmHg AV Mean Grad:      7.0 mmHg LVOT Vmax:  111.00 cm/s LVOT Vmean:        65.100 cm/s LVOT VTI:          0.237 m LVOT/AV VTI ratio: 0.64 AI PHT:            413 msec  AORTA Ao Root diam: 3.70 cm MITRAL VALVE                TRICUSPID VALVE MV Area (PHT): 2.42 cm     TR Peak grad:   13.2 mmHg MV Decel Time: 313 msec     TR Vmax:        182.00 cm/s MV E velocity: 85.70 cm/s MV A velocity: 103.00 cm/s  SHUNTS MV E/A ratio:  0.83         Systemic VTI:  0.24 m                             Systemic Diam: 2.20 cm Dorris Carnes MD Electronically signed by Dorris Carnes MD Signature Date/Time: 01/16/2022/5:49:43 PM    Final     Labs:  CBC: Recent Labs    12/06/21 1101 12/20/21 0941 01/03/22 0931 01/17/22 1200  WBC 4.1 9.0 3.1*  5.2  HGB 9.9* 9.6* 8.4* 9.0*  HCT 31.5* 30.9* 26.8* 29.0*  PLT 288 235 266 241    COAGS: No results for input(s): "INR", "APTT" in the last 8760 hours.  BMP: Recent Labs    12/06/21 1101 12/20/21 0941 01/03/22 0931 01/17/22 1200  NA 139 134* 136 135  K 4.0 4.3 3.6 3.9  CL 106 103 104 106  CO2 26 21* 23 23  GLUCOSE 178* 208* 162* 194*  BUN 17 27* 16 19  CALCIUM 8.8* 8.9 8.6* 9.0  CREATININE 1.14* 1.41* 1.06* 0.96  GFRNONAA 56* 44* >60 >60    LIVER FUNCTION TESTS: Recent Labs    12/06/21 1101 12/20/21 0941 01/03/22 0931 01/17/22 1200  BILITOT 0.5 0.7 0.5 0.7  AST 15 19 14* 17  ALT _0 ALKPHOS 85 96 78 97  PROT 7.5 7.4 7.0 7.1  ALBUMIN 3.2* 3.2* 2.9* 3.0*    TUMOR MARKERS: No results for input(s): "AFPTM", "CEA", "CA199", "CHROMGRNA" in the last 8760 hours.  Assessment and Plan: History of CVA, DM II, gout, HTN, migraines and multiple myeloma. Pt was diagnosed 10/29/2017 with multiple myeloma. Pt started PO chemotherapy in 2019 for MM but therapy was stopped after CVA in June 2021. Treatment resumed June 2022. Pt was referred to IR for tunneled catheter with port placement to start IV chemotherapy.  Pt resting on stretcher.  She is A&O, calm and pleasant.  She is in no distress. Pt is NPO per order.   Risks and benefits of image guided tunneled catheter with port placement was discussed with the patient including, but not limited to bleeding, infection, pneumothorax, or fibrin sheath development and need for additional procedures.  All of the patient's questions were answered, patient is agreeable to proceed. Consent signed and in chart.   Thank you for this interesting consult.  I greatly enjoyed meeting LANDRI DORSAINVIL and look forward to participating in their care.  A copy of this report was sent to the requesting provider on this date.  Electronically Signed: Tyson Alias, NP 01/18/2022, 1:41 PM   I spent a total of 20 minutes in face to  face in clinical consultation, greater than 50% of which was counseling/coordinating care for multiple myeloma.

## 2022-01-19 ENCOUNTER — Telehealth (HOSPITAL_COMMUNITY): Payer: Self-pay | Admitting: Radiology

## 2022-01-19 ENCOUNTER — Other Ambulatory Visit (HOSPITAL_COMMUNITY): Payer: Self-pay | Admitting: Hematology

## 2022-01-19 NOTE — Telephone Encounter (Signed)
Patient with history of multiple myeloma s/p porta cath placement on 7.13.23 in IR. Patient called asking for reiteration of post procedure care instructions. Education provided to both the Patient and her husband who verbalized understanding.   

## 2022-01-22 ENCOUNTER — Emergency Department (HOSPITAL_COMMUNITY): Payer: BC Managed Care – PPO

## 2022-01-22 ENCOUNTER — Emergency Department (HOSPITAL_COMMUNITY)
Admission: EM | Admit: 2022-01-22 | Discharge: 2022-01-22 | Disposition: A | Discharge status: Home / Self Care | Payer: BC Managed Care – PPO | Attending: Emergency Medicine | Admitting: Emergency Medicine

## 2022-01-22 ENCOUNTER — Encounter (HOSPITAL_COMMUNITY): Payer: Self-pay | Admitting: Hematology

## 2022-01-22 ENCOUNTER — Encounter (HOSPITAL_COMMUNITY): Payer: Self-pay

## 2022-01-22 DIAGNOSIS — D649 Anemia, unspecified: Secondary | ICD-10-CM | POA: Insufficient documentation

## 2022-01-22 DIAGNOSIS — M79601 Pain in right arm: Secondary | ICD-10-CM | POA: Diagnosis present

## 2022-01-22 DIAGNOSIS — I10 Essential (primary) hypertension: Secondary | ICD-10-CM | POA: Insufficient documentation

## 2022-01-22 DIAGNOSIS — E119 Type 2 diabetes mellitus without complications: Secondary | ICD-10-CM | POA: Diagnosis not present

## 2022-01-22 DIAGNOSIS — Z7982 Long term (current) use of aspirin: Secondary | ICD-10-CM | POA: Insufficient documentation

## 2022-01-22 DIAGNOSIS — Z7984 Long term (current) use of oral hypoglycemic drugs: Secondary | ICD-10-CM | POA: Insufficient documentation

## 2022-01-22 DIAGNOSIS — Z79899 Other long term (current) drug therapy: Secondary | ICD-10-CM | POA: Diagnosis not present

## 2022-01-22 LAB — BASIC METABOLIC PANEL
Anion gap: 7 (ref 5–15)
BUN: 16 mg/dL (ref 6–20)
CO2: 25 mmol/L (ref 22–32)
Calcium: 8.7 mg/dL — ABNORMAL LOW (ref 8.9–10.3)
Chloride: 106 mmol/L (ref 98–111)
Creatinine, Ser: 0.84 mg/dL (ref 0.44–1.00)
GFR, Estimated: >60 mL/min (ref >60)
Glucose, Bld: 138 mg/dL — ABNORMAL HIGH (ref 70–99)
Potassium: 4.4 mmol/L (ref 3.5–5.1)
Sodium: 138 mmol/L (ref 135–145)

## 2022-01-22 LAB — CBC WITH DIFFERENTIAL/PLATELET
Abs Immature Granulocytes: 0.07 10*3/uL (ref 0.00–0.07)
Basophils Absolute: 0 10*3/uL (ref 0.0–0.1)
Basophils Relative: 1 %
Eosinophils Absolute: 0 10*3/uL (ref 0.0–0.5)
Eosinophils Relative: 1 %
HCT: 27.6 % — ABNORMAL LOW (ref 36.0–46.0)
Hemoglobin: 8.6 g/dL — ABNORMAL LOW (ref 12.0–15.0)
Immature Granulocytes: 1 %
Lymphocytes Relative: 26 %
Lymphs Abs: 1.3 10*3/uL (ref 0.7–4.0)
MCH: 28 pg (ref 26.0–34.0)
MCHC: 31.2 g/dL (ref 30.0–36.0)
MCV: 89.9 fL (ref 80.0–100.0)
Monocytes Absolute: 0.7 10*3/uL (ref 0.1–1.0)
Monocytes Relative: 14 %
Neutro Abs: 2.9 10*3/uL (ref 1.7–7.7)
Neutrophils Relative %: 57 %
Platelets: 229 10*3/uL (ref 150–400)
RBC: 3.07 MIL/uL — ABNORMAL LOW (ref 3.87–5.11)
RDW: 16.9 % — ABNORMAL HIGH (ref 11.5–15.5)
WBC: 5 10*3/uL (ref 4.0–10.5)
nRBC: 0.4 % — ABNORMAL HIGH (ref 0.0–0.2)

## 2022-01-22 MED ORDER — ONDANSETRON HCL 4 MG/2ML IJ SOLN
4.0000 mg | Freq: Once | INTRAMUSCULAR | Status: AC
  Administered 2022-01-22: 4 mg via INTRAVENOUS
  Filled 2022-01-22: qty 2

## 2022-01-22 MED ORDER — HYDROMORPHONE HCL 1 MG/ML IJ SOLN
0.5000 mg | Freq: Once | INTRAMUSCULAR | Status: AC
  Administered 2022-01-22: 0.5 mg via INTRAVENOUS
  Filled 2022-01-22: qty 0.5

## 2022-01-22 MED ORDER — HYDROMORPHONE HCL 1 MG/ML IJ SOLN
1.0000 mg | Freq: Once | INTRAMUSCULAR | Status: AC
  Administered 2022-01-22: 1 mg via INTRAVENOUS
  Filled 2022-01-22: qty 1

## 2022-01-22 MED ORDER — OXYCODONE-ACETAMINOPHEN 5-325 MG PO TABS: 1.0000 | ORAL_TABLET | Freq: Four times a day (QID) | ORAL | 0 refills | Status: AC | PRN

## 2022-01-22 NOTE — Discharge Instructions (Signed)
I suspect this is more secondary due to muscular pain from probable placement of your catheter, started on some pain medication please take as prescribed.  May also supplement with ibuprofen.  I have given you a short course of narcotics please take as prescribed.  This medication can make you drowsy do not consume alcohol or operate heavy machinery when taking this medication.  This medication is Tylenol in it do not take Tylenol and take this medication.   Please follow-up with oncology and or orthopedics for further assessment.  Come back to the emergency department if you develop chest pain, shortness of breath, severe abdominal pain, uncontrolled nausea, vomiting, diarrhea.

## 2022-01-22 NOTE — Telephone Encounter (Signed)
Rx approved

## 2022-01-22 NOTE — ED Triage Notes (Signed)
Pt states she had port-a-cath placement on Thursday to receive new chemo drugs related to multiple myeloma. Pt describes increasing R arm pain since then. Denies known injury/trauma. No fevers since surgery per pt.

## 2022-01-22 NOTE — ED Provider Notes (Signed)
[facility] EMERGENCY DEPARTMENT Provider Note   CSN: [id number] Arrival date & time: 01/22/22  1015     History  Chief Complaint  Patient presents with   Arm Pain   Post-op Problem    Joanna Reid is a 56 y.o. female.  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    Home Medications Prior to Admission medications   Medication Sig Start Date End Date Taking? Authorizing Provider  oxyCODONE-acetaminophen (PERCOCET/ROXICET) 5-325 MG tablet Take 1 tablet by mouth every 6 (six) hours as needed for up to 4 days for severe pain. 01/22/22 01/26/22 Yes Marcello Fennel, PA-C  acyclovir (ZOVIRAX) 400 MG tablet TAKE (1) TABLET BY MOUTH TWICE DAILY. 12/26/21   Harriett Rush, PA-C  albuterol (VENTOLIN HFA) 108 (90 Base) MCG/ACT inhaler Inhale 2 puffs into the lungs every 4 (four) hours as needed for wheezing or shortness of breath. 11/30/20   Lovena Le, Malena M, DO  allopurinol (ZYLOPRIM) 300 MG tablet TAKE (1) TABLET BY MOUTH ONCE DAILY. 10/27/21   Lindell Spar, MD  amLODipine (NORVASC) 2.5 MG tablet Take 2.5 mg by mouth daily. 01/27/21   [provider]  aspirin EC 81 MG tablet Take 1 tablet (81 mg total) by mouth daily. 12/26/19   Rehman, Mechele Dawley, MD  bumetanide (BUMEX) 0.5 MG tablet Take 0.5 mg by mouth daily.  06/30/19   [provider]  carvedilol (COREG) 25 MG tablet Take 1  tablet (25 mg total) by mouth 2 (two) times daily. 12/29/19   Thurnell Lose, MD  diazepam (VALIUM) 5 MG tablet TAKE (1) TABLET BY MOUTH AT BEDTIME. 11/21/21   Lindell Spar, MD  diclofenac Sodium (VOLTAREN) 1 % GEL Apply 2 g topically 4 (four) times daily. 05/01/21   Caccavale, Sophia, PA-C  dicyclomine (BENTYL) 20 MG tablet Take 1 tablet (20 mg total) by mouth 2 (two) times daily as needed for spasms. 05/01/21   Caccavale, Sophia, PA-C  Docosanol 10 % CREA Apply 1 application topically daily as needed (for nose).     [provider]  docusate sodium (COLACE) 100 MG capsule Take 100 mg by mouth daily.     [provider]  erythromycin ophthalmic ointment Place 1 application into the left eye at bedtime. 08/07/21   Lindell Spar, MD  escitalopram (LEXAPRO) 10 MG tablet TAKE (1) TABLET BY MOUTH ONCE DAILY. MAY START WITH 1/2 TABLET FOR 7 DAYS. 10/27/21   Lindell Spar, MD  fluticasone [facility]) 50 MCG/ACT nasal spray INSTILL 2 SPRAYS INTO BOTH NOSTRILS DAILY 06/28/21   Lindell Spar, MD  Lactulose 20 GM/30ML SOLN Take 30 mLs (20 g total) by mouth daily. 09/27/21   Derek Jack, MD  losartan (COZAAR) 100 MG tablet Take 100 mg by mouth daily.    [provider]  magnesium oxide (MAG-OX) 400 (241.3 Mg) MG tablet Take 1 tablet (400 mg total) by mouth 3 (three) times daily.  10/02/18   Derek Jack, MD  metFORMIN (GLUCOPHAGE) 500 MG tablet Take 500 mg by mouth 2 (two) times daily. 11/16/21 01/18/22  [provider]  nitrofurantoin, macrocrystal-monohydrate, (MACROBID) 100 MG capsule TAKE (1) CAPSULE BY MOUTH AT BEDTIME. 08/30/21   Derek Jack, MD  ondansetron (ZOFRAN) 8 MG tablet TAKE 1 TABLET BY MOUTH EVERY 8 HOURS AS NEEDED. 08/03/20   Derek Jack, MD  oxybutynin (DITROPAN XL) 10 MG 24 hr tablet Take 1 tablet (10 mg total) by mouth at bedtime. 11/29/21   Lindell Spar, MD  pantoprazole (PROTONIX) 40 MG tablet TAKE (1) TABLET BY MOUTH  ONCE DAILY. 01/17/22   Patel, Colin Broach, MD  POMALYST 2 MG capsule TAKE 1 CAPSULE BY MOUTH ONCE DAILY FOR 21 DAYS ON AND 7 DAYS OFF 01/22/22   Derek Jack, MD  pregabalin (LYRICA) 200 MG capsule TAKE (1) CAPSULE BY MOUTH TWICE DAILY. 07/27/21   Derek Jack, MD  prochlorperazine (COMPAZINE) 10 MG tablet TAKE 1 TABLET BY MOUTH EVERY 6 HOURS AS NEEDED FOR NAUSEA OR VOMITING. 08/03/20   Derek Jack, MD  Propylene Glycol (SYSTANE BALANCE) 0.6 % SOLN Apply 1 drop to eye daily as needed (dry eye).     [provider]  rosuvastatin (CRESTOR) 5 MG tablet Take 5 mg by mouth daily. 10/17/20   [provider]  TRULICITY 4.94 WH/6.7RF SOPN SMARTSIG:0.5 Milliliter(s) SUB-Q Once a Week 12/19/21   [provider]  valACYclovir (VALTREX) 1000 MG tablet TAKE 2 TABLETS BY MOUTH NOW; THEN 2 12 HOURS LATER. 12/19/21   Derek Jack, MD  VELCADE 3.5 MG injection RECONSTITUTE EACH VIAL AS DIRECTED. INJECT 2.5 MG UNDER THE SKIN EVERY 14 DAYS. 01/22/22   Derek Jack, MD      Allergies    Patient has no known allergies.    Review of Systems   Review of Systems  Constitutional:  Negative for chills and fever.  Respiratory:  Negative for shortness of breath.   Cardiovascular:  Negative for chest pain.  Gastrointestinal:  Negative for abdominal pain.  Musculoskeletal:        Right arm pain  Neurological:  Negative for headaches.    Physical Exam Updated Vital Signs BP 129/87   Pulse 70   Temp 98.1 F (36.7 C) (Oral)   Resp 14   SpO2 95%  Physical Exam Vitals and nursing note reviewed.  Constitutional:      General: She is not in acute distress.    Appearance: She is not ill-appearing.  HENT:     Head: Normocephalic and atraumatic.     Nose: No congestion.  Eyes:     Conjunctiva/sclera: Conjunctivae normal.  Cardiovascular:     Rate and Rhythm: Normal rate and regular rhythm.     Pulses: Normal pulses.     Heart sounds: No murmur heard.     No friction rub. No gallop.  Pulmonary:     Effort: No respiratory distress.     Breath sounds: No wheezing, rhonchi or rales.     Comments: Port-A-Cath noted in the right upper chest she was her scars without evidence of infection there is soft nontender. Musculoskeletal:     Comments: Patient to palpable nodules on the middle of her right forearm, tender to palpation, no noted erythema or edema of the right arm, full range of motion in her fingers wrist and elbow, 2-second capillary refill 2+ radial pulses.  Patient spine was palpated nontender to palpation no step-off deformities noted.  Skin:  General: Skin is warm and dry.  Neurological:     Mental Status: She is alert.     GCS: GCS eye subscore is 4. GCS verbal subscore is 5. GCS motor subscore is 6.     Cranial Nerves: No facial asymmetry.     Motor: Weakness present.     Comments: No facial asymmetry no slurring of her words, following two-step commands, no decreased grip strength in the right hand versus left, secondary due to pain  Psychiatric:        Mood and Affect: Mood normal.     ED Results / Procedures / Treatments   Labs (all labs ordered are listed, but only abnormal results are displayed) Labs Reviewed  BASIC METABOLIC PANEL - Abnormal; Notable for the following components:      Result Value   Glucose, Bld 138 (*)    Calcium 8.7 (*)    All other components within normal limits  CBC WITH DIFFERENTIAL/PLATELET - Abnormal; Notable for the following components:   RBC 3.07 (*)    Hemoglobin 8.6 (*)    HCT 27.6 (*)    RDW 16.9 (*)    nRBC 0.4 (*)    All other components within normal limits    EKG None  Radiology DG Forearm Right  Result Date: 01/22/2022 CLINICAL DATA:  Right forearm pain status post catheter placement. EXAM: RIGHT FOREARM - 2 VIEW COMPARISON:  None Available. FINDINGS: There are no signs of acute fracture or dislocation. There is a small cluster of radiopacities along the dorsal soft  tissues of the proximal forearm which may be external to the patient versus dystrophic calcifications perhaps secondary to prior trauma. Soft tissues are otherwise unremarkable. IMPRESSION: 1. No acute osseous abnormality. 2. Small cluster of radiopacities along the dorsal soft tissues of the proximal forearm may be external to the patient versus dystrophic calcifications perhaps secondary to prior trauma. Electronically Signed   By: Kerby Moors M.D.   On: 01/22/2022 14:13   DG Humerus Right  Result Date: 01/22/2022 CLINICAL DATA:  Right arm pain status post porta catheter placement. EXAM: RIGHT HUMERUS - 2+ VIEW COMPARISON:  None Available. FINDINGS: There is no evidence of fracture or other focal bone lesions. Soft tissues are unremarkable. IMPRESSION: Negative. Electronically Signed   By: Kerby Moors M.D.   On: 01/22/2022 14:11   DG Chest Portable 1 View  Result Date: 01/22/2022 CLINICAL DATA:  Port-A-Cath placement. EXAM: PORTABLE CHEST 1 VIEW COMPARISON:  August 11, 2018. FINDINGS: Stable cardiomediastinal silhouette. Interval placement of right internal jugular Port-A-Cath with distal tip in expected position of the SVC. No pneumothorax or pleural effusion is noted. Lungs are clear. Bony thorax is unremarkable. IMPRESSION: Interval placement of right internal jugular Port-A-Cath with distal tip in expected position of the SVC. Electronically Signed   By: Marijo Conception M.D.   On: 01/22/2022 12:16   US Venous Img Upper Uni Right(DVT)  Result Date: 01/22/2022 CLINICAL DATA:  Right upper extremity swelling. EXAM: RIGHT UPPER EXTREMITY VENOUS DOPPLER ULTRASOUND TECHNIQUE: Gray-scale sonography with graded compression, as well as color Doppler and duplex ultrasound were performed to evaluate the upper extremity deep venous system from the level of the subclavian vein and including the jugular, axillary, basilic, radial, ulnar and upper cephalic vein. Spectral Doppler was utilized to evaluate  flow at rest and with distal augmentation maneuvers. COMPARISON:  None Available. FINDINGS: Contralateral Subclavian Vein: Respiratory phasicity is normal and symmetric with the symptomatic side. No evidence of thrombus.  Normal compressibility. Internal Jugular Vein: No evidence of thrombus. Normal compressibility, respiratory phasicity and response to augmentation. Subclavian Vein: No evidence of thrombus. Normal compressibility, respiratory phasicity and response to augmentation. Axillary Vein: No evidence of thrombus. Normal compressibility, respiratory phasicity and response to augmentation. Cephalic Vein: No evidence of thrombus. Normal compressibility, respiratory phasicity and response to augmentation. Basilic Vein: No evidence of thrombus. Normal compressibility, respiratory phasicity and response to augmentation. Brachial Veins: No evidence of thrombus. Normal compressibility, respiratory phasicity and response to augmentation. Radial Veins: No evidence of thrombus. Normal compressibility, respiratory phasicity and response to augmentation. Ulnar Veins: No evidence of thrombus. Normal compressibility, respiratory phasicity and response to augmentation. None Other: The region of painful palpable abnormality there multiple nonspecific small (subcentimeter hyperechoic regions, possibly areas of fat. IMPRESSION: No evidence of DVT within the right upper extremity. Electronically Signed   By: Margaretha Sheffield M.D.   On: 01/22/2022 12:07    Procedures Procedures    Medications Ordered in ED Medications  ondansetron (ZOFRAN) injection 4 mg (4 mg Intravenous Given 01/22/22 1131)  HYDROmorphone (DILAUDID) injection 1 mg (1 mg Intravenous Given 01/22/22 1132)  HYDROmorphone (DILAUDID) injection 0.5 mg (0.5 mg Intravenous Given 01/22/22 1344)    ED Course/ Medical Decision Making/ A&P                           Medical Decision Making Amount and/or Complexity of Data Reviewed Labs: ordered. Radiology:  ordered.  Risk Prescription drug management.   This patient presents to the ED for concern of right arm pain, this involves an extensive number of treatment options, and is a complaint that carries with it a high risk of complications and morbidity.  The differential diagnosis includes cellulitis, DVT, limb ischemia, CVA    Additional history obtained:  Additional history obtained from husband at bedside External records from outside source obtained and reviewed including oncology notes   Co morbidities that complicate the patient evaluation  Immunosuppressed, on current chemo  Social Determinants of Health:  N/A    Lab Tests:  I Ordered, and personally interpreted labs.  The pertinent results include: CBC shows normocytic anemia he will 8.6 at baseline, BMP shows glucose of 138 calcium 8.7   Imaging Studies ordered:  I ordered imaging studies including DG of chest, ultrasound I independently visualized and interpreted imaging which showed chest x-ray and ultrasound were both negative acute findings, x-ray of the right humerus and forearm also negative for acute findings I agree with the radiologist interpretation   Cardiac Monitoring:  The patient was maintained on a cardiac monitor.  I personally viewed and interpreted the cardiac monitored which showed an underlying rhythm of: N/A   Medicines ordered and prescription drug management:  I ordered medication including Zofran, Dilaudid I have reviewed the patients home medicines and have made adjustments as needed  Critical Interventions:  N/A   Reevaluation:  Presents with pain of the right arm, she had a benign physical exam, unclear etiology, will obtain imaging lab work provide pain medications reassess  Reassessed after pain medications, states her pain is much improved, reassess her grip strength they are now equal bilaterally.  Due to her cancer history will consult oncology for determination of spinal  scan seem to be performed.  Reassessed resting comfortably having no complaints she is agreement with plan and discharge at this time.  Consultations Obtained:  I requested consultation with the oncology Dr. Delton Coombes,  and discussed lab and  imaging findings as well as pertinent plan - they recommend: Does not feel pain is related to myeloma as pain started after procedure, recommends imaging of the arm as well as possible consultation with IR    Test Considered:  MRI cervical spine/thoracic spine-deferred as my suspicion for spinal cord abnormality is low at this time as she has no focal deficits, her spine was also nontender to palpation making fractures less likely.     Rule out I have low suspicion for limb ischemia no evidence of ischemia on my exam 2+ radial pulses with 2-second capillary refill.  I have low suspicion for DVT as DVT study is negative.  I have low suspicion for fracture dislocation is imaging is also negative.  Of note imaging does endorse cluster of metallic findings on her forearm, there is nothing noted on the outskirts of her skin, I suspect this is likely an old trauma.  I have low suspicion for infection no evidence infection my exam, no leukocytosis, no tachycardia afebrile, I am also evaluate the patient's port site which was also unremarkable.  I have low suspicion her pain is secondary due to port placement as port placement is superficial and be highly unlikely for it to cause any of right arm damage.    Dispostion and problem list  After consideration of the diagnostic results and the patients response to treatment, I feel that the patent would benefit from discharge.  Right arm pain-suspect this is more muscular in nature and or possible flareup of her neuropathy, will provide her with dose of pain medications, follow-up with oncology and/or orthopedic for further evaluation and strict return precautions.            Final Clinical Impression(s)  / ED Diagnoses Final diagnoses:  Right arm pain    Rx / DC Orders ED Discharge Orders          Ordered    oxyCODONE-acetaminophen (PERCOCET/ROXICET) 5-325 MG tablet  Every 6 hours PRN        01/22/22 1455              Aron Baba 01/22/22 1457    Milton Ferguson, MD 01/25/22 1023

## 2022-01-23 ENCOUNTER — Other Ambulatory Visit (HOSPITAL_COMMUNITY): Payer: Self-pay | Admitting: Hematology

## 2022-01-23 ENCOUNTER — Other Ambulatory Visit: Payer: Self-pay | Admitting: Internal Medicine

## 2022-01-23 ENCOUNTER — Other Ambulatory Visit (HOSPITAL_COMMUNITY): Payer: Self-pay | Admitting: Physician Assistant

## 2022-01-23 DIAGNOSIS — C9000 Multiple myeloma not having achieved remission: Secondary | ICD-10-CM

## 2022-01-23 DIAGNOSIS — G620 Drug-induced polyneuropathy: Secondary | ICD-10-CM

## 2022-01-23 DIAGNOSIS — F411 Generalized anxiety disorder: Secondary | ICD-10-CM

## 2022-01-24 ENCOUNTER — Encounter: Payer: Self-pay | Admitting: Orthopaedic Surgery

## 2022-01-24 ENCOUNTER — Ambulatory Visit (INDEPENDENT_AMBULATORY_CARE_PROVIDER_SITE_OTHER): Payer: BC Managed Care – PPO | Admitting: Orthopaedic Surgery

## 2022-01-24 ENCOUNTER — Other Ambulatory Visit (HOSPITAL_COMMUNITY): Payer: Self-pay | Admitting: *Deleted

## 2022-01-24 VITALS — BP 142/85 | HR 80 | Ht 64.0 in | Wt 266.0 lb

## 2022-01-24 DIAGNOSIS — C9002 Multiple myeloma in relapse: Secondary | ICD-10-CM

## 2022-01-24 DIAGNOSIS — M79601 Pain in right arm: Secondary | ICD-10-CM | POA: Diagnosis not present

## 2022-01-24 MED ORDER — LIDOCAINE-PRILOCAINE 2.5-2.5 % EX CREA: 1.0000 | TOPICAL_CREAM | CUTANEOUS | 6 refills | Status: AC | PRN

## 2022-01-24 NOTE — Progress Notes (Addendum)
Subjective:    Patient ID: Joanna Reid, female    DOB: [birth year], 56 y.o.   MRN: [id number]  HPI She has history of multiple myeloma from 2019.  She had relapse recently.  A port-a-cath was inserted last week.  She developed pain of the right upper extremity, of the proximal upper arm and the forearm on the right that got worse.  She was seen in the ER 01-22-22 for this.  She had multiple X-rays which were negative.  She continues to have pain.  She has no problems with the surgical site.  She has no redness, no numbness, no swelling.  She has no neck pain.  Nothing seems to help.  She cannot use the right arm well without pain.  I have independently reviewed and interpreted x-rays of this patient done at another site by another physician or qualified health professional. I have reviewed the ER notes.  Review of Systems  Constitutional:  Positive for activity change.  Musculoskeletal:  Positive for arthralgias and myalgias.  Hematological:        Multiple myeloma  All other systems reviewed and are negative. For Review of Systems, all other systems reviewed and are negative.  The following is a summary of the past history medically, past history surgically, known current medicines, social history and family history.  This information is gathered electronically by the computer from prior information and documentation.  I review this each visit and have found including this information at this point in the chart is beneficial and informative.   Past Medical History:  Diagnosis Date   Acid reflux    Allergic rhinitis    Cancer ([place])    multiple myeloma   Diabetes mellitus    type 2   Gout    Gout    H/o COVID-19--- was Positive 09/17/2019, Negative 12/23/19 AND also Neg on  12/26/19 12/26/2019   HBP (high blood pressure)    History of kidney stones    Migraines     Past Surgical History:  Procedure Laterality Date   BREAST CYST EXCISION Left    2009 no visible scar on skin    CESAREAN SECTION     COLONOSCOPY WITH PROPOFOL N/A 12/25/2019   Procedure: COLONOSCOPY WITH PROPOFOL;  Surgeon: Rogene Houston, MD;  [facility];  Service: Endoscopy;  Laterality: N/A;  730   EXTRACORPOREAL SHOCK WAVE LITHOTRIPSY Left 10/10/2017   Procedure: LEFT EXTRACORPOREAL SHOCK WAVE LITHOTRIPSY (ESWL);  Surgeon: Bjorn Loser, MD;  Location: [facility];  Service: Urology;  Laterality: Left;   EYE SURGERY     HEMORRHOID SURGERY N/A 11/19/2012   Procedure: HEMORRHOIDECTOMY;  Surgeon: Jamesetta So, MD;  Location: [facility];  Service: General;  Laterality: N/A;   IR IMAGING GUIDED PORT INSERTION  01/18/2022   kidney stones  1998   LAPAROSCOPIC UNILATERAL SALPINGO OOPHERECTOMY  05/14/2012   Procedure: LAPAROSCOPIC UNILATERAL SALPINGO OOPHORECTOMY;  Surgeon: [name], MD;  Location: [facility];  Service: Gynecology;  Laterality: Right;  laparoscopic right salpingo-oophorectomy   PARTIAL HYSTERECTOMY     POLYPECTOMY  12/25/2019   Procedure: POLYPECTOMY;  Surgeon: Rogene Houston, MD;  [facility];  Service: Endoscopy;;   TONSILECTOMY, ADENOIDECTOMY, BILATERAL MYRINGOTOMY AND TUBES     VESICOVAGINAL FISTULA CLOSURE W/ TAH      Current Outpatient Medications on File Prior to Visit  Medication Sig Dispense Refill   acyclovir (ZOVIRAX) 400 MG tablet TAKE (1) TABLET BY MOUTH TWICE DAILY. [place]  tablet 0   albuterol (VENTOLIN HFA) 108 (90 Base) MCG/ACT inhaler Inhale 2 puffs into the lungs every 4 (four) hours as needed for wheezing or shortness of breath. 1 each 3   allopurinol (ZYLOPRIM) 300 MG tablet TAKE (1) TABLET BY MOUTH ONCE DAILY. 90 tablet 0   amLODipine (NORVASC) 2.5 MG tablet Take 2.5 mg by mouth daily.     aspirin EC 81 MG tablet Take 1 tablet (81 mg total) by mouth daily. 30 tablet 11   bumetanide (BUMEX) 0.5 MG tablet Take 0.5 mg by mouth daily.      carvedilol (COREG) 25 MG tablet Take 1 tablet (25 mg total) by mouth 2 (two) times daily.     diazepam (VALIUM) 5 MG  tablet TAKE (1) TABLET BY MOUTH AT BEDTIME. 30 tablet 0   diclofenac Sodium (VOLTAREN) 1 % GEL Apply 2 g topically 4 (four) times daily. 100 g 0   dicyclomine (BENTYL) 20 MG tablet Take 1 tablet (20 mg total) by mouth 2 (two) times daily as needed for spasms. 20 tablet 0   Docosanol 10 % CREA Apply 1 application topically daily as needed (for nose).      docusate sodium (COLACE) 100 MG capsule Take 100 mg by mouth daily.      erythromycin ophthalmic ointment Place 1 application into the left eye at bedtime. 3.5 g 0   escitalopram (LEXAPRO) 10 MG tablet TAKE (1) TABLET BY MOUTH ONCE DAILY. MAY START WITH 1/2 TABLET FOR 7 DAYS. 90 tablet 0   fluticasone (FLONASE) 50 MCG/ACT nasal spray INSTILL 2 SPRAYS INTO BOTH NOSTRILS DAILY 16 g 4   Lactulose 20 GM/30ML SOLN Take 30 mLs (20 g total) by mouth daily. 450 mL 1   losartan (COZAAR) 100 MG tablet Take 100 mg by mouth daily.     magnesium oxide (MAG-OX) 400 (241.3 Mg) MG tablet Take 1 tablet (400 mg total) by mouth 3 (three) times daily. 90 tablet 3   metFORMIN (GLUCOPHAGE) 500 MG tablet Take 500 mg by mouth 2 (two) times daily.     nitrofurantoin, macrocrystal-monohydrate, (MACROBID) 100 MG capsule TAKE (1) CAPSULE BY MOUTH AT BEDTIME. 90 capsule 0   ondansetron (ZOFRAN) 8 MG tablet TAKE 1 TABLET BY MOUTH EVERY 8 HOURS AS NEEDED. 30 tablet 6   oxybutynin (DITROPAN XL) 10 MG 24 hr tablet Take 1 tablet (10 mg total) by mouth at bedtime. 30 tablet 5   oxyCODONE-acetaminophen (PERCOCET/ROXICET) 5-325 MG tablet Take 1 tablet by mouth every 6 (six) hours as needed for up to 4 days for severe pain. 15 tablet 0   pantoprazole (PROTONIX) 40 MG tablet TAKE (1) TABLET BY MOUTH ONCE DAILY. 90 tablet 0   POMALYST 2 MG capsule TAKE 1 CAPSULE BY MOUTH ONCE DAILY FOR 21 DAYS ON AND 7 DAYS OFF 21 capsule 0   pregabalin (LYRICA) 200 MG capsule TAKE (1) CAPSULE BY MOUTH TWICE DAILY. 60 capsule 5   prochlorperazine (COMPAZINE) 10 MG tablet TAKE 1 TABLET BY MOUTH EVERY 6  HOURS AS NEEDED FOR NAUSEA OR VOMITING. 30 tablet 6   Propylene Glycol (SYSTANE BALANCE) 0.6 % SOLN Apply 1 drop to eye daily as needed (dry eye).      rosuvastatin (CRESTOR) 5 MG tablet Take 5 mg by mouth daily.     TRULICITY 4.62 VO/3.5KK SOPN SMARTSIG:0.5 Milliliter(s) SUB-Q Once a Week     valACYclovir (VALTREX) 1000 MG tablet TAKE 2 TABLETS BY MOUTH NOW; THEN 2 12 HOURS LATER. 12 tablet 0  VELCADE 3.5 MG injection RECONSTITUTE EACH VIAL AS DIRECTED. INJECT 2.5 MG UNDER THE SKIN EVERY 14 DAYS. 2 each 2   No current facility-administered medications on file prior to visit.    Social History   Socioeconomic History   Marital status: Married    Spouse name: [name]   Number of children: 1   Years of education: 12   Highest education level: Some college, no degree  Occupational History    Employer: [employer]  Tobacco Use   Smoking status: Former    Types: Cigarettes    Quit date: 02/27/1999    Years since quitting: 22.9   Smokeless tobacco: Never   Tobacco comments:    socially   Vaping Use   Vaping Use: Never used  Substance and Sexual Activity   Alcohol use: No    Alcohol/week: 0.0 standard drinks of alcohol   Drug use: No   Sexual activity: Yes    Birth control/protection: Surgical    Comment: hyst  Other Topics Concern   Not on file  Social History Narrative   Not on file   Social Determinants of Health   Financial Resource Strain: Low Risk  (08/08/2021)   Overall Financial Resource Strain (CARDIA)    Difficulty of Paying Living Expenses: Not hard at all  Food Insecurity: No Food Insecurity (11/09/2019)   Hunger Vital Sign    Worried About Running Out of Food in the Last Year: Never true    Ran Out of Food in the Last Year: Never true  Transportation Needs: No Transportation Needs (08/08/2021)   PRAPARE - Hydrologist (Medical): No    Lack of Transportation (Non-Medical): No  Physical Activity: Unknown (08/08/2021)   Exercise Vital Sign     Days of Exercise per Week: Not on file    Minutes of Exercise per Session: 10 min  Stress: No Stress Concern Present (08/08/2021)   [place]    Feeling of Stress : Not at all  Social Connections: [place] (08/08/2021)   Social Connection and Isolation Panel [NHANES]    Frequency of Communication with Friends and Family: More than three times a week    Frequency of Social Gatherings with Friends and Family: Twice a week    Attends Religious Services: More than 4 times per year    Active Member of [employer] or Organizations: Yes    Attends Music therapist: More than 4 times per year    Marital Status: Married  Human resources officer Violence: Not At Risk (08/08/2021)   Humiliation, Afraid, Rape, and Kick questionnaire    Fear of Current or Ex-Partner: No    Emotionally Abused: No    Physically Abused: No    Sexually Abused: No    Family History  Problem Relation Age of Onset   Arthritis Other    Cancer Other    Diabetes Other    Hypertension Mother    Dementia Mother    Diabetes Father    ALS Father    Diabetes Brother    Hypertension Brother    Cancer Paternal Aunt    COPD Maternal Grandmother    Cancer Maternal Grandfather    Anesthesia problems Paternal Grandfather     BP (!) 142/85   Pulse 80   Ht _0  (1.626 m)   Wt 266 lb (120.7 kg)   BMI 45.66 kg/m   Body mass index is 45.66 kg/m.  Objective:   Physical Exam Vitals and nursing note reviewed. Exam conducted with a chaperone present.  Constitutional:      Appearance: She is well-developed.  HENT:     Head: Normocephalic and atraumatic.  Eyes:     Conjunctiva/sclera: Conjunctivae normal.     Pupils: Pupils are equal, round, and reactive to light.  Cardiovascular:     Rate and Rhythm: Normal rate and regular rhythm.  Pulmonary:     Effort: Pulmonary effort is normal.  Abdominal:     Palpations: Abdomen is soft.   Musculoskeletal:       Arms:     Cervical back: Normal range of motion and neck supple.  Skin:    General: Skin is warm and dry.  Neurological:     Mental Status: She is alert and oriented to person, place, and time.     Cranial Nerves: No cranial nerve deficit.     Motor: No abnormal muscle tone.     Coordination: Coordination normal.     Deep Tendon Reflexes: Reflexes are normal and symmetric. Reflexes normal.  Psychiatric:        Behavior: Behavior normal.        Thought Content: Thought content normal.        Judgment: Judgment normal.           Assessment & Plan:   Encounter Diagnoses  Name Primary?   Right arm pain Yes   Multiple myeloma in relapse ([place])    I feel this may be related to positional changes during the procedure.  I feel it should be limited.  I have recommended heat and Voltaren Gel which she has.  I have given sling for her use.  I have discussed exercise to do.  Return in one week.  Call if worse.  Call if any problem.  Precautions discussed.  Electronically Signed Sanjuana Kava, MD 7/19/202311:12 AM

## 2022-01-25 ENCOUNTER — Inpatient Hospital Stay (HOSPITAL_COMMUNITY): Payer: BC Managed Care – PPO

## 2022-01-25 ENCOUNTER — Encounter (HOSPITAL_COMMUNITY): Payer: Self-pay

## 2022-01-25 ENCOUNTER — Encounter (HOSPITAL_COMMUNITY): Payer: Self-pay | Admitting: Hematology

## 2022-01-25 VITALS — BP 136/78 | HR 93 | Temp 98.2°F | Resp 18 | Ht 64.0 in | Wt 267.4 lb

## 2022-01-25 DIAGNOSIS — C9000 Multiple myeloma not having achieved remission: Secondary | ICD-10-CM

## 2022-01-25 DIAGNOSIS — Z5112 Encounter for antineoplastic immunotherapy: Secondary | ICD-10-CM | POA: Diagnosis not present

## 2022-01-25 LAB — CBC WITH DIFFERENTIAL/PLATELET
Abs Immature Granulocytes: 0.07 10*3/uL (ref 0.00–0.07)
Basophils Absolute: 0 10*3/uL (ref 0.0–0.1)
Basophils Relative: 1 %
Eosinophils Absolute: 0.1 10*3/uL (ref 0.0–0.5)
Eosinophils Relative: 1 %
HCT: 28.8 % — ABNORMAL LOW (ref 36.0–46.0)
Hemoglobin: 8.8 g/dL — ABNORMAL LOW (ref 12.0–15.0)
Immature Granulocytes: 1 %
Lymphocytes Relative: 26 %
Lymphs Abs: 1.4 10*3/uL (ref 0.7–4.0)
MCH: 27.8 pg (ref 26.0–34.0)
MCHC: 30.6 g/dL (ref 30.0–36.0)
MCV: 90.9 fL (ref 80.0–100.0)
Monocytes Absolute: 0.7 10*3/uL (ref 0.1–1.0)
Monocytes Relative: 13 %
Neutro Abs: 3.1 10*3/uL (ref 1.7–7.7)
Neutrophils Relative %: 58 %
Platelets: 257 10*3/uL (ref 150–400)
RBC: 3.17 MIL/uL — ABNORMAL LOW (ref 3.87–5.11)
RDW: 16.7 % — ABNORMAL HIGH (ref 11.5–15.5)
WBC: 5.3 10*3/uL (ref 4.0–10.5)
nRBC: 0.6 % — ABNORMAL HIGH (ref 0.0–0.2)

## 2022-01-25 LAB — COMPREHENSIVE METABOLIC PANEL
ALT: 14 U/L (ref 0–44)
AST: 21 U/L (ref 15–41)
Albumin: 2.9 g/dL — ABNORMAL LOW (ref 3.5–5.0)
Alkaline Phosphatase: 109 U/L (ref 38–126)
Anion gap: 8 (ref 5–15)
BUN: 21 mg/dL — ABNORMAL HIGH (ref 6–20)
CO2: 25 mmol/L (ref 22–32)
Calcium: 8.8 mg/dL — ABNORMAL LOW (ref 8.9–10.3)
Chloride: 106 mmol/L (ref 98–111)
Creatinine, Ser: 0.92 mg/dL (ref 0.44–1.00)
GFR, Estimated: >60 mL/min (ref >60)
Glucose, Bld: 225 mg/dL — ABNORMAL HIGH (ref 70–99)
Potassium: 4.2 mmol/L (ref 3.5–5.1)
Sodium: 139 mmol/L (ref 135–145)
Total Bilirubin: 0.6 mg/dL (ref 0.3–1.2)
Total Protein: 6.5 g/dL (ref 6.5–8.1)

## 2022-01-25 LAB — TYPE AND SCREEN
ABO/RH(D): A POS
Antibody Screen: NEGATIVE

## 2022-01-25 LAB — MAGNESIUM: Magnesium: 1.7 mg/dL (ref 1.7–2.4)

## 2022-01-25 LAB — ABO/RH: ABO/RH(D): A POS

## 2022-01-25 MED ORDER — DIPHENHYDRAMINE HCL 25 MG PO CAPS
50.0000 mg | ORAL_CAPSULE | Freq: Once | ORAL | Status: AC
  Administered 2022-01-25: 50 mg via ORAL
  Filled 2022-01-25: qty 2

## 2022-01-25 MED ORDER — DEXTROSE 5 % IV SOLN
18.0000 mg/m2 | Freq: Once | INTRAVENOUS | Status: AC
  Administered 2022-01-25: 40 mg via INTRAVENOUS
  Filled 2022-01-25: qty 5

## 2022-01-25 MED ORDER — DARATUMUMAB-HYALURONIDASE-FIHJ 1800-30000 MG-UT/15ML ~~LOC~~ SOLN
1800.0000 mg | Freq: Once | SUBCUTANEOUS | Status: AC
  Administered 2022-01-25: 1800 mg via SUBCUTANEOUS
  Filled 2022-01-25: qty 15

## 2022-01-25 MED ORDER — ACETAMINOPHEN 325 MG PO TABS
650.0000 mg | ORAL_TABLET | Freq: Once | ORAL | Status: AC
  Administered 2022-01-25: 325 mg via ORAL
  Filled 2022-01-25: qty 2

## 2022-01-25 MED ORDER — HEPARIN SOD (PORK) LOCK FLUSH 100 UNIT/ML IV SOLN
500.0000 [IU] | Freq: Once | INTRAVENOUS | Status: AC | PRN
  Administered 2022-01-25: 500 [IU]

## 2022-01-25 MED ORDER — MONTELUKAST SODIUM 10 MG PO TABS
10.0000 mg | ORAL_TABLET | Freq: Once | ORAL | Status: AC
  Administered 2022-01-25: 10 mg via ORAL
  Filled 2022-01-25: qty 1

## 2022-01-25 MED ORDER — SODIUM CHLORIDE 0.9 % IV SOLN: Freq: Once | INTRAVENOUS | Status: AC

## 2022-01-25 MED ORDER — SODIUM CHLORIDE 0.9 % IV SOLN
20.0000 mg | Freq: Once | INTRAVENOUS | Status: AC
  Administered 2022-01-25: 20 mg via INTRAVENOUS
  Filled 2022-01-25: qty 20

## 2022-01-25 MED ORDER — SODIUM CHLORIDE 0.9% FLUSH
10.0000 mL | INTRAVENOUS | Status: DC | PRN
  Administered 2022-01-25: 10 mL

## 2022-01-25 NOTE — Progress Notes (Signed)
Patients port flushed without difficulty.  Good blood return noted with no bruising or swelling noted at site.  Stable during access and blood draw.  Patient to remain accessed for treatment. 

## 2022-01-25 NOTE — Patient Instructions (Signed)
[facility]  Discharge Instructions: Thank you for choosing [facility] to provide your oncology and hematology care.  If you have a lab appointment with the [facility], please come in thru the Main Entrance and check in at the main information desk.  Wear comfortable clothing and clothing appropriate for easy access to any Portacath or PICC line.   We strive to give you quality time with your provider. You may need to reschedule your appointment if you arrive late (15 or more minutes).  Arriving late affects you and other patients whose appointments are after yours.  Also, if you miss three or more appointments without notifying the office, you may be dismissed from the clinic at the provider's discretion.      For prescription refill requests, have your pharmacy contact our office and allow 72 hours for refills to be completed.    Today you received the following chemotherapy and/or immunotherapy agents KyprolisCarfilzomib injection What is this medication? CARFILZOMIB (kar FILZ oh mib) targets a specific protein within cancer cells and stops the cancer cells from growing. It is used to treat multiple myeloma. This medicine may be used for other purposes; ask your health care provider or pharmacist if you have questions. COMMON BRAND NAME(S): KYPROLIS What should I tell my care team before I take this medication? They need to know if you have any of these conditions: heart disease history of blood clots irregular heartbeat kidney disease liver disease lung or breathing disease an unusual or allergic reaction to carfilzomib, or other medicines, foods, dyes, or preservatives pregnant or trying to get pregnant breast-feeding How should I use this medication? This medicine is for injection or infusion into a vein. It is given by a health care professional in a hospital or clinic setting. Talk to your pediatrician regarding the use of this medicine in children.  Special care may be needed. Overdosage: If you think you have taken too much of this medicine contact a poison control center or emergency room at once. NOTE: This medicine is only for you. Do not share this medicine with others. What if I miss a dose? It is important not to miss your dose. Call your doctor or health care professional if you are unable to keep an appointment. What may interact with this medication? Interactions are not expected. This list may not describe all possible interactions. Give your health care provider a list of all the medicines, herbs, non-prescription drugs, or dietary supplements you use. Also tell them if you smoke, drink alcohol, or use illegal drugs. Some items may interact with your medicine. What should I watch for while using this medication? Your condition will be monitored while you are receiving this medicine. You may need blood work done while you are taking this medicine. Do not become pregnant while taking this medicine or for 6 months after stopping it. Women should inform their health care provider if they wish to become pregnant or think they might be pregnant. Men should not father a child while taking this medicine and for 3 months after stopping it. There is a potential for serious side effects to an unborn child. Talk to your health care provider for more information. Do not breast-feed an infant while taking this medicine or for 2 weeks after stopping it. Check with your health care provider if you have severe diarrhea, nausea, and vomiting, or if you sweat a lot. The loss of too much body fluid may make it dangerous for  you to take this medicine. You may get drowsy or dizzy. Do not drive, use machinery, or do anything that needs mental alertness until you know how this medicine affects you. Do not stand up or sit up quickly, especially if you are an older patient. This reduces the risk of dizzy or fainting spells. What side effects may I notice from  receiving this medication? Side effects that you should report to your doctor or health care professional as soon as possible: allergic reactions like skin rash, itching or hives, swelling of the face, lips, or tongue confusion dizziness feeling faint or lightheaded fever or chills palpitations seizures signs and symptoms of bleeding such as bloody or black, tarry stools; red or dark-brown urine; spitting up blood or brown material that looks like coffee grounds; red spots on the skin; unusual bruising or bleeding including from the eye, gums, or nose signs and symptoms of a blood clot such as breathing problems; changes in vision; chest pain; severe, sudden headache; pain, swelling, warmth in the leg; trouble speaking; sudden numbness or weakness of the face, arm or leg signs and symptoms of kidney injury like trouble passing urine or change in the amount of urine signs and symptoms of liver injury like dark yellow or brown urine; general ill feeling or flu-like symptoms; light-colored stools; loss of appetite; nausea; right upper belly pain; unusually weak or tired; yellowing of the eyes or skin Side effects that usually do not require medical attention (report to your doctor or health care professional if they continue or are bothersome): back pain cough diarrhea headache muscle cramps trouble sleeping vomiting This list may not describe all possible side effects. Call your doctor for medical advice about side effects. You may report side effects to FDA at 1-800-FDA-1088. Where should I keep my medication? This drug is given in a hospital or clinic and will not be stored at home. NOTE: This sheet is a summary. It may not cover all possible information. If you have questions about this medicine, talk to your doctor, pharmacist, or health care provider.  2023 Elsevier/Gold Standard (2020-08-04 00:00:00) , [place].     Daratumumab; Hyaluronidase Injection What is this  medication? DARATUMUMAB; HYALURONIDASE (dar a toom ue mab / hye al ur ON i dase) is a monoclonal antibody. Hyaluronidase is used to improve the effects of daratumumab. It treats certain types of cancer. Some of the cancers treated are multiple myeloma and light-chain amyloidosis. This medicine may be used for other purposes; ask your health care provider or pharmacist if you have questions. COMMON BRAND NAME(S): DARZALEX FASPRO What should I tell my care team before I take this medication? They need to know if you have any of these conditions: heart disease infection especially a viral infection such as chickenpox, cold sores, herpes, or hepatitis B lung or breathing disease an unusual or allergic reaction to daratumumab, hyaluronidase, other medicines, foods, dyes, or preservatives pregnant or trying to get pregnant breast-feeding How should I use this medication? This medicine is for injection under the skin. It is given by a health care professional in a hospital or clinic setting. Talk to your pediatrician regarding the use of this medicine in children. Special care may be needed. Overdosage: If you think you have taken too much of this medicine contact a poison control center or emergency room at once. NOTE: This medicine is only for you. Do not share this medicine with others. What if I miss a dose? Keep appointments for follow-up  doses as directed. It is important not to miss your dose. Call your doctor or health care professional if you are unable to keep an appointment. What may interact with this medication? Interactions have not been studied. This list may not describe all possible interactions. Give your health care provider a list of all the medicines, herbs, non-prescription drugs, or dietary supplements you use. Also tell them if you smoke, drink alcohol, or use illegal drugs. Some items may interact with your medicine. What should I watch for while using this medication? Your  condition will be monitored carefully while you are receiving this medicine. This medicine can cause serious allergic reactions. To reduce your risk, your health care provider may give you other medicine to take before receiving this one. Be sure to follow the directions from your health care provider. This medicine can affect the results of blood tests to match your blood type. These changes can last for up to 6 months after the final dose. Your healthcare provider will do blood tests to match your blood type before you start treatment. Tell all of your healthcare providers that you are being treated with this medicine before receiving a blood transfusion. This medicine can affect the results of some tests used to determine treatment response; extra tests may be needed to evaluate response. Do not become pregnant while taking this medicine or for 3 months after stopping it. Women should inform their health care provider if they wish to become pregnant or think they might be pregnant. There is a potential for serious side effects to an unborn child. Talk to your health care provider for more information. Do not breast-feed an infant while taking this medicine. What side effects may I notice from receiving this medication? Side effects that you should report to your care team as soon as possible: Allergic reactions--skin rash, itching or hives, swelling of the face, lips, or tongue Blood clot--chest pain, shortness of breath, pain, swelling or warmth in the leg Blurred vision Fast, irregular heartbeat Infection--fever, chills, cough, sore throat, pain or trouble passing urine Injection reactions--dizziness, fast heartbeat, feeling faint or lightheaded, falls, headache, increase in blood pressure, nausea, vomiting, or wheezing or trouble breathing with loud or whistling sounds Low red blood cell counts--trouble breathing, feeling faint, lightheaded or falls, unusually weak or tired Unusual bleeding or  bruising Side effects that usually do not require medical attention (report these to your care team if they continue or are bothersome): Back pain Constipation Diarrhea Pain, tingling, numbness in the hands or feet Pain, redness, or irritation at site where injected Muscle cramp or pain Swelling of the ankles, feet, hands Tiredness Trouble sleeping This list may not describe all possible side effects. Call your doctor for medical advice about side effects. You may report side effects to FDA at 1-800-FDA-1088. Where should I keep my medication? This drug is given in a hospital or clinic and will not be stored at home. NOTE: This sheet is a summary. It may not cover all possible information. If you have questions about this medicine, talk to your doctor, pharmacist, or health care provider.  2023 Elsevier/Gold Standard (2021-05-26 00:00:00)    To help prevent nausea and vomiting after your treatment, we encourage you to take your nausea medication as directed.  BELOW ARE SYMPTOMS THAT SHOULD BE REPORTED IMMEDIATELY: *FEVER GREATER THAN 100.4 F (38 C) OR HIGHER *CHILLS OR SWEATING *NAUSEA AND VOMITING THAT IS NOT CONTROLLED WITH YOUR NAUSEA MEDICATION *UNUSUAL SHORTNESS OF BREATH *UNUSUAL BRUISING OR  BLEEDING *URINARY PROBLEMS (pain or burning when urinating, or frequent urination) *BOWEL PROBLEMS (unusual diarrhea, constipation, pain near the anus) TENDERNESS IN MOUTH AND THROAT WITH OR WITHOUT PRESENCE OF ULCERS (sore throat, sores in mouth, or a toothache) UNUSUAL RASH, SWELLING OR PAIN  UNUSUAL VAGINAL DISCHARGE OR ITCHING   Items with * indicate a potential emergency and should be followed up as soon as possible or go to the Emergency Department if any problems should occur.  Please show the CHEMOTHERAPY ALERT CARD or IMMUNOTHERAPY ALERT CARD at check-in to the Emergency Department and triage nurse.  Should you have questions after your visit or need to cancel or reschedule  your appointment, please contact [facility] [phone]  and follow the prompts.  Office hours are 8:00 a.m. to 4:30 p.m. Monday - Friday. Please note that voicemails left after 4:00 p.m. may not be returned until the following business day.  We are closed weekends and major holidays. You have access to a nurse at all times for urgent questions. Please call the main number to the clinic [phone] and follow the prompts.  For any non-urgent questions, you may also contact your provider using MyChart. We now offer e-Visits for anyone 28 and older to request care online for non-urgent symptoms. For details visit [email or web address].   Also download the MyChart app! Go to the app store, search "MyChart", open the app, select [facility], and log in with your MyChart username and password.  Masks are optional in the cancer centers. If you would like for your care team to wear a mask while they are taking care of you, please let them know. For doctor visits, patients may have with them one support person who is at least 57 years old. At this time, visitors are not allowed in the infusion area.

## 2022-01-25 NOTE — Progress Notes (Signed)
Pharmacist Chemotherapy Monitoring - Initial Assessment    Anticipated start date: 01/25/22   The following has been reviewed per standard work regarding the patient's treatment regimen: The patient's diagnosis, treatment plan and drug doses, and organ/hematologic function Lab orders and baseline tests specific to treatment regimen  The treatment plan start date, drug sequencing, and pre-medications Prior authorization status  Patient's documented medication list, including drug-drug interaction screen and prescriptions for anti-emetics and supportive care specific to the treatment regimen The drug concentrations, fluid compatibility, administration routes, and timing of the medications to be used The patient's access for treatment and lifetime cumulative dose history, if applicable  The patient's medication allergies and previous infusion related reactions, if applicable   Changes made to treatment plan:  N/A  Follow up needed:  N/A   Wynona Neat, [facility], 01/25/2022  9:38 AM

## 2022-01-25 NOTE — Progress Notes (Signed)
Patient presents today for D1C1 Kyprolis/Darzalex Faspro. Patient currently taking Percocet 5/325 mg every 6 hours for pain. Per Ranell Patrick RPH. Give one Tylenol today '325mg'$  for pre-medication.   Treatment given today per MD orders. Tolerated infusion without adverse affects. Vital signs stable. No complaints at this time. Discharged from clinic ambulatory in stable condition. Alert and oriented x 3. F/U with [facility] as scheduled.

## 2022-01-26 ENCOUNTER — Telehealth (HOSPITAL_COMMUNITY): Payer: Self-pay | Admitting: Hematology

## 2022-01-26 NOTE — Progress Notes (Signed)
24 hour call back. Patient denies any side effects from the Darzalex Faspro injection. Patient denies any redness, peeling or pain at injection site. Patient has no complaints.

## 2022-01-26 NOTE — Telephone Encounter (Signed)
[facility]  [phone] F [phone] P  [facility] [phone] P

## 2022-01-29 ENCOUNTER — Other Ambulatory Visit (HOSPITAL_COMMUNITY): Payer: Self-pay

## 2022-01-29 ENCOUNTER — Other Ambulatory Visit: Payer: Self-pay

## 2022-01-29 DIAGNOSIS — C9000 Multiple myeloma not having achieved remission: Secondary | ICD-10-CM

## 2022-01-30 ENCOUNTER — Encounter: Payer: Self-pay | Admitting: Orthopaedic Surgery

## 2022-01-30 ENCOUNTER — Ambulatory Visit (INDEPENDENT_AMBULATORY_CARE_PROVIDER_SITE_OTHER): Payer: BC Managed Care – PPO | Admitting: Orthopaedic Surgery

## 2022-01-30 DIAGNOSIS — M79601 Pain in right arm: Secondary | ICD-10-CM

## 2022-01-30 DIAGNOSIS — C9002 Multiple myeloma in relapse: Secondary | ICD-10-CM

## 2022-01-30 NOTE — Progress Notes (Signed)
I am better  Her right arm is not hurting that much today. She has been using the sling and has been out of the sling as she got better.  She is being treated for Multiple Myeloma and received steroids prior to the treatment last Thursday.  She said that helped.  She has no numbness and is using the right arm much better.  NV intact.  She has no redness, no swelling.  Encounter Diagnoses  Name Primary?   Right arm pain Yes   Multiple myeloma in relapse ([place])    I will see as needed.  Call if any problem.  Precautions discussed.  Avoid heavy lifting next few weeks.  A gallon of water is eight pounds.  Electronically Signed Sanjuana Kava, MD 7/25/202310:03 AM

## 2022-01-31 ENCOUNTER — Inpatient Hospital Stay (HOSPITAL_COMMUNITY): Payer: BC Managed Care – PPO

## 2022-01-31 ENCOUNTER — Encounter (HOSPITAL_COMMUNITY): Payer: Self-pay | Admitting: Hematology

## 2022-02-01 ENCOUNTER — Inpatient Hospital Stay (HOSPITAL_COMMUNITY): Payer: BC Managed Care – PPO

## 2022-02-01 ENCOUNTER — Encounter (HOSPITAL_COMMUNITY): Payer: Self-pay | Admitting: Hematology

## 2022-02-01 ENCOUNTER — Inpatient Hospital Stay (HOSPITAL_BASED_OUTPATIENT_CLINIC_OR_DEPARTMENT_OTHER): Payer: BC Managed Care – PPO | Admitting: Hematology

## 2022-02-01 VITALS — BP 138/89 | HR 73 | Temp 99.3°F | Resp 16 | Ht 64.0 in | Wt 268.5 lb

## 2022-02-01 VITALS — BP 116/68 | HR 73 | Temp 98.5°F | Resp 17

## 2022-02-01 DIAGNOSIS — C9000 Multiple myeloma not having achieved remission: Secondary | ICD-10-CM | POA: Diagnosis not present

## 2022-02-01 DIAGNOSIS — Z95828 Presence of other vascular implants and grafts: Secondary | ICD-10-CM

## 2022-02-01 DIAGNOSIS — Z5112 Encounter for antineoplastic immunotherapy: Secondary | ICD-10-CM | POA: Diagnosis not present

## 2022-02-01 LAB — CBC WITH DIFFERENTIAL/PLATELET
Abs Immature Granulocytes: 0.03 10*3/uL (ref 0.00–0.07)
Basophils Absolute: 0 10*3/uL (ref 0.0–0.1)
Basophils Relative: 0 %
Eosinophils Absolute: 0.1 10*3/uL (ref 0.0–0.5)
Eosinophils Relative: 2 %
HCT: 26.9 % — ABNORMAL LOW (ref 36.0–46.0)
Hemoglobin: 8.2 g/dL — ABNORMAL LOW (ref 12.0–15.0)
Immature Granulocytes: 1 %
Lymphocytes Relative: 21 %
Lymphs Abs: 1.1 10*3/uL (ref 0.7–4.0)
MCH: 27.3 pg (ref 26.0–34.0)
MCHC: 30.5 g/dL (ref 30.0–36.0)
MCV: 89.7 fL (ref 80.0–100.0)
Monocytes Absolute: 0.7 10*3/uL (ref 0.1–1.0)
Monocytes Relative: 14 %
Neutro Abs: 3.3 10*3/uL (ref 1.7–7.7)
Neutrophils Relative %: 62 %
Platelets: 175 10*3/uL (ref 150–400)
RBC: 3 MIL/uL — ABNORMAL LOW (ref 3.87–5.11)
RDW: 17.2 % — ABNORMAL HIGH (ref 11.5–15.5)
WBC: 5.2 10*3/uL (ref 4.0–10.5)
nRBC: 0.6 % — ABNORMAL HIGH (ref 0.0–0.2)

## 2022-02-01 LAB — COMPREHENSIVE METABOLIC PANEL
ALT: 12 U/L (ref 0–44)
AST: 16 U/L (ref 15–41)
Albumin: 3 g/dL — ABNORMAL LOW (ref 3.5–5.0)
Alkaline Phosphatase: 89 U/L (ref 38–126)
Anion gap: 7 (ref 5–15)
BUN: 24 mg/dL — ABNORMAL HIGH (ref 6–20)
CO2: 24 mmol/L (ref 22–32)
Calcium: 8.4 mg/dL — ABNORMAL LOW (ref 8.9–10.3)
Chloride: 107 mmol/L (ref 98–111)
Creatinine, Ser: 1.11 mg/dL — ABNORMAL HIGH (ref 0.44–1.00)
GFR, Estimated: 58 mL/min — ABNORMAL LOW (ref >60)
Glucose, Bld: 160 mg/dL — ABNORMAL HIGH (ref 70–99)
Potassium: 4 mmol/L (ref 3.5–5.1)
Sodium: 138 mmol/L (ref 135–145)
Total Bilirubin: 0.5 mg/dL (ref 0.3–1.2)
Total Protein: 6.4 g/dL — ABNORMAL LOW (ref 6.5–8.1)

## 2022-02-01 LAB — MAGNESIUM: Magnesium: 1.8 mg/dL (ref 1.7–2.4)

## 2022-02-01 MED ORDER — SODIUM CHLORIDE 0.9 % IV SOLN: Freq: Once | INTRAVENOUS | Status: AC

## 2022-02-01 MED ORDER — ACETAMINOPHEN 325 MG PO TABS
650.0000 mg | ORAL_TABLET | Freq: Once | ORAL | Status: AC
  Administered 2022-02-01: 650 mg via ORAL
  Filled 2022-02-01: qty 2

## 2022-02-01 MED ORDER — PROCHLORPERAZINE MALEATE 10 MG PO TABS
10.0000 mg | ORAL_TABLET | Freq: Once | ORAL | Status: AC
  Administered 2022-02-01: 10 mg via ORAL
  Filled 2022-02-01: qty 1

## 2022-02-01 MED ORDER — PROCHLORPERAZINE MALEATE 10 MG PO TABS: 10.0000 mg | ORAL_TABLET | Freq: Once | ORAL | Status: DC

## 2022-02-01 MED ORDER — SODIUM CHLORIDE 0.9% FLUSH
10.0000 mL | INTRAVENOUS | Status: DC | PRN
  Administered 2022-02-01 (×2): 10 mL

## 2022-02-01 MED ORDER — MONTELUKAST SODIUM 10 MG PO TABS
10.0000 mg | ORAL_TABLET | Freq: Once | ORAL | Status: AC
  Administered 2022-02-01: 10 mg via ORAL
  Filled 2022-02-01: qty 1

## 2022-02-01 MED ORDER — HEPARIN SOD (PORK) LOCK FLUSH 100 UNIT/ML IV SOLN
500.0000 [IU] | Freq: Once | INTRAVENOUS | Status: AC | PRN
  Administered 2022-02-01: 500 [IU]

## 2022-02-01 MED ORDER — DARATUMUMAB-HYALURONIDASE-FIHJ 1800-30000 MG-UT/15ML ~~LOC~~ SOLN
1800.0000 mg | Freq: Once | SUBCUTANEOUS | Status: AC
  Administered 2022-02-01: 1800 mg via SUBCUTANEOUS
  Filled 2022-02-01: qty 15

## 2022-02-01 MED ORDER — DEXTROSE 5 % IV SOLN
70.0000 mg/m2 | Freq: Once | INTRAVENOUS | Status: AC
  Administered 2022-02-01: 150 mg via INTRAVENOUS
  Filled 2022-02-01: qty 60

## 2022-02-01 MED ORDER — DIPHENHYDRAMINE HCL 25 MG PO CAPS
50.0000 mg | ORAL_CAPSULE | Freq: Once | ORAL | Status: AC
  Administered 2022-02-01: 50 mg via ORAL
  Filled 2022-02-01: qty 2

## 2022-02-01 MED ORDER — SODIUM CHLORIDE 0.9 % IV SOLN
20.0000 mg | Freq: Once | INTRAVENOUS | Status: AC
  Administered 2022-02-01: 20 mg via INTRAVENOUS
  Filled 2022-02-01: qty 2

## 2022-02-01 NOTE — Patient Instructions (Signed)
[facility] at [facility] Discharge Instructions   You were seen and examined today by Dr. Katragadda.  He reviewed the results of your lab work which are normal/stable.   We will proceed with your treatment today.  Return as scheduled.    Thank you for choosing [facility] at [facility] to provide your oncology and hematology care.  To afford each patient quality time with our provider, please arrive at least 15 minutes before your scheduled appointment time.   If you have a lab appointment with the Cancer Center please come in thru the Main Entrance and check in at the main information desk.  You need to re-schedule your appointment should you arrive 10 or more minutes late.  We strive to give you quality time with our providers, and arriving late affects you and other patients whose appointments are after yours.  Also, if you no show three or more times for appointments you may be dismissed from the clinic at the providers discretion.     Again, thank you for choosing [facility].  Our hope is that these requests will decrease the amount of time that you wait before being seen by our physicians.       _____________________________________________________________  Should you have questions after your visit to [facility], please contact our office at [phone] and follow the prompts.  Our office hours are 8:00 a.m. and 4:30 p.m. Monday - Friday.  Please note that voicemails left after 4:00 p.m. may not be returned until the following business day.  We are closed weekends and major holidays.  You do have access to a nurse 24-7, just call the main number to the clinic [phone] and do not press any options, hold on the line and a nurse will answer the phone.    For prescription refill requests, have your pharmacy contact our office and allow 72 hours.    Due to Covid, you will need to wear a mask upon entering  the hospital. If you do not have a mask, a mask will be given to you at the Main Entrance upon arrival. For doctor visits, patients may have 1 support person age 18 or older with them. For treatment visits, patients can not have anyone with them due to social distancing guidelines and our immunocompromised population.      

## 2022-02-01 NOTE — Patient Instructions (Signed)
[facility]  Discharge Instructions: Thank you for choosing [facility] to provide your oncology and hematology care.  If you have a lab appointment with the [facility], please come in thru the Main Entrance and check in at the main information desk.  Wear comfortable clothing and clothing appropriate for easy access to any Portacath or PICC line.   We strive to give you quality time with your provider. You may need to reschedule your appointment if you arrive late (15 or more minutes).  Arriving late affects you and other patients whose appointments are after yours.  Also, if you miss three or more appointments without notifying the office, you may be dismissed from the clinic at the provider's discretion.      For prescription refill requests, have your pharmacy contact our office and allow 72 hours for refills to be completed.    Today you received the following chemotherapy and/or immunotherapy agents Daratumumab and Kyprolis, return as scheduled. Carfilzomib injection What is this medication? CARFILZOMIB (kar FILZ oh mib) targets a specific protein within cancer cells and stops the cancer cells from growing. It is used to treat multiple myeloma. This medicine may be used for other purposes; ask your health care provider or pharmacist if you have questions. COMMON BRAND NAME(S): KYPROLIS What should I tell my care team before I take this medication? They need to know if you have any of these conditions: heart disease history of blood clots irregular heartbeat kidney disease liver disease lung or breathing disease an unusual or allergic reaction to carfilzomib, or other medicines, foods, dyes, or preservatives pregnant or trying to get pregnant breast-feeding How should I use this medication? This medicine is for injection or infusion into a vein. It is given by a health care professional in a hospital or clinic setting. Talk to your pediatrician  regarding the use of this medicine in children. Special care may be needed. Overdosage: If you think you have taken too much of this medicine contact a poison control center or emergency room at once. NOTE: This medicine is only for you. Do not share this medicine with others. What if I miss a dose? It is important not to miss your dose. Call your doctor or health care professional if you are unable to keep an appointment. What may interact with this medication? Interactions are not expected. This list may not describe all possible interactions. Give your health care provider a list of all the medicines, herbs, non-prescription drugs, or dietary supplements you use. Also tell them if you smoke, drink alcohol, or use illegal drugs. Some items may interact with your medicine. What should I watch for while using this medication? Your condition will be monitored while you are receiving this medicine. You may need blood work done while you are taking this medicine. Do not become pregnant while taking this medicine or for 6 months after stopping it. Women should inform their health care provider if they wish to become pregnant or think they might be pregnant. Men should not father a child while taking this medicine and for 3 months after stopping it. There is a potential for serious side effects to an unborn child. Talk to your health care provider for more information. Do not breast-feed an infant while taking this medicine or for 2 weeks after stopping it. Check with your health care provider if you have severe diarrhea, nausea, and vomiting, or if you sweat a lot. The loss of too much body  fluid may make it dangerous for you to take this medicine. You may get drowsy or dizzy. Do not drive, use machinery, or do anything that needs mental alertness until you know how this medicine affects you. Do not stand up or sit up quickly, especially if you are an older patient. This reduces the risk of dizzy or  fainting spells. What side effects may I notice from receiving this medication? Side effects that you should report to your doctor or health care professional as soon as possible: allergic reactions like skin rash, itching or hives, swelling of the face, lips, or tongue confusion dizziness feeling faint or lightheaded fever or chills palpitations seizures signs and symptoms of bleeding such as bloody or black, tarry stools; red or dark-brown urine; spitting up blood or brown material that looks like coffee grounds; red spots on the skin; unusual bruising or bleeding including from the eye, gums, or nose signs and symptoms of a blood clot such as breathing problems; changes in vision; chest pain; severe, sudden headache; pain, swelling, warmth in the leg; trouble speaking; sudden numbness or weakness of the face, arm or leg signs and symptoms of kidney injury like trouble passing urine or change in the amount of urine signs and symptoms of liver injury like dark yellow or brown urine; general ill feeling or flu-like symptoms; light-colored stools; loss of appetite; nausea; right upper belly pain; unusually weak or tired; yellowing of the eyes or skin Side effects that usually do not require medical attention (report to your doctor or health care professional if they continue or are bothersome): back pain cough diarrhea headache muscle cramps trouble sleeping vomiting This list may not describe all possible side effects. Call your doctor for medical advice about side effects. You may report side effects to FDA at 1-800-FDA-1088. Where should I keep my medication? This drug is given in a hospital or clinic and will not be stored at home. NOTE: This sheet is a summary. It may not cover all possible information. If you have questions about this medicine, talk to your doctor, pharmacist, or health care provider.  2023 Elsevier/Gold Standard (2020-08-04 00:00:00)  Daratumumab; Hyaluronidase  Injection What is this medication? DARATUMUMAB; HYALURONIDASE (dar a toom ue mab / hye al ur ON i dase) is a monoclonal antibody. Hyaluronidase is used to improve the effects of daratumumab. It treats certain types of cancer. Some of the cancers treated are multiple myeloma and light-chain amyloidosis. This medicine may be used for other purposes; ask your health care provider or pharmacist if you have questions. COMMON BRAND NAME(S): DARZALEX FASPRO What should I tell my care team before I take this medication? They need to know if you have any of these conditions: heart disease infection especially a viral infection such as chickenpox, cold sores, herpes, or hepatitis B lung or breathing disease an unusual or allergic reaction to daratumumab, hyaluronidase, other medicines, foods, dyes, or preservatives pregnant or trying to get pregnant breast-feeding How should I use this medication? This medicine is for injection under the skin. It is given by a health care professional in a hospital or clinic setting. Talk to your pediatrician regarding the use of this medicine in children. Special care may be needed. Overdosage: If you think you have taken too much of this medicine contact a poison control center or emergency room at once. NOTE: This medicine is only for you. Do not share this medicine with others. What if I miss a dose? Keep appointments for follow-up doses  as directed. It is important not to miss your dose. Call your doctor or health care professional if you are unable to keep an appointment. What may interact with this medication? Interactions have not been studied. This list may not describe all possible interactions. Give your health care provider a list of all the medicines, herbs, non-prescription drugs, or dietary supplements you use. Also tell them if you smoke, drink alcohol, or use illegal drugs. Some items may interact with your medicine. What should I watch for while using  this medication? Your condition will be monitored carefully while you are receiving this medicine. This medicine can cause serious allergic reactions. To reduce your risk, your health care provider may give you other medicine to take before receiving this one. Be sure to follow the directions from your health care provider. This medicine can affect the results of blood tests to match your blood type. These changes can last for up to 6 months after the final dose. Your healthcare provider will do blood tests to match your blood type before you start treatment. Tell all of your healthcare providers that you are being treated with this medicine before receiving a blood transfusion. This medicine can affect the results of some tests used to determine treatment response; extra tests may be needed to evaluate response. Do not become pregnant while taking this medicine or for 3 months after stopping it. Women should inform their health care provider if they wish to become pregnant or think they might be pregnant. There is a potential for serious side effects to an unborn child. Talk to your health care provider for more information. Do not breast-feed an infant while taking this medicine. What side effects may I notice from receiving this medication? Side effects that you should report to your care team as soon as possible: Allergic reactions--skin rash, itching or hives, swelling of the face, lips, or tongue Blood clot--chest pain, shortness of breath, pain, swelling or warmth in the leg Blurred vision Fast, irregular heartbeat Infection--fever, chills, cough, sore throat, pain or trouble passing urine Injection reactions--dizziness, fast heartbeat, feeling faint or lightheaded, falls, headache, increase in blood pressure, nausea, vomiting, or wheezing or trouble breathing with loud or whistling sounds Low red blood cell counts--trouble breathing, feeling faint, lightheaded or falls, unusually weak or  tired Unusual bleeding or bruising Side effects that usually do not require medical attention (report these to your care team if they continue or are bothersome): Back pain Constipation Diarrhea Pain, tingling, numbness in the hands or feet Pain, redness, or irritation at site where injected Muscle cramp or pain Swelling of the ankles, feet, hands Tiredness Trouble sleeping This list may not describe all possible side effects. Call your doctor for medical advice about side effects. You may report side effects to FDA at 1-800-FDA-1088. Where should I keep my medication? This drug is given in a hospital or clinic and will not be stored at home. NOTE: This sheet is a summary. It may not cover all possible information. If you have questions about this medicine, talk to your doctor, pharmacist, or health care provider.  2023 Elsevier/Gold Standard (2021-05-26 00:00:00)    To help prevent nausea and vomiting after your treatment, we encourage you to take your nausea medication as directed.  BELOW ARE SYMPTOMS THAT SHOULD BE REPORTED IMMEDIATELY: *FEVER GREATER THAN 100.4 F (38 C) OR HIGHER *CHILLS OR SWEATING *NAUSEA AND VOMITING THAT IS NOT CONTROLLED WITH YOUR NAUSEA MEDICATION *UNUSUAL SHORTNESS OF BREATH *UNUSUAL BRUISING OR BLEEDING *  URINARY PROBLEMS (pain or burning when urinating, or frequent urination) *BOWEL PROBLEMS (unusual diarrhea, constipation, pain near the anus) TENDERNESS IN MOUTH AND THROAT WITH OR WITHOUT PRESENCE OF ULCERS (sore throat, sores in mouth, or a toothache) UNUSUAL RASH, SWELLING OR PAIN  UNUSUAL VAGINAL DISCHARGE OR ITCHING   Items with * indicate a potential emergency and should be followed up as soon as possible or go to the Emergency Department if any problems should occur.  Please show the CHEMOTHERAPY ALERT CARD or IMMUNOTHERAPY ALERT CARD at check-in to the Emergency Department and triage nurse.  Should you have questions after your visit or need  to cancel or reschedule your appointment, please contact [facility] [phone]  and follow the prompts.  Office hours are 8:00 a.m. to 4:30 p.m. Monday - Friday. Please note that voicemails left after 4:00 p.m. may not be returned until the following business day.  We are closed weekends and major holidays. You have access to a nurse at all times for urgent questions. Please call the main number to the clinic [phone] and follow the prompts.  For any non-urgent questions, you may also contact your provider using MyChart. We now offer e-Visits for anyone 35 and older to request care online for non-urgent symptoms. For details visit [email or web address].   Also download the MyChart app! Go to the app store, search "MyChart", open the app, select [facility], and log in with your MyChart username and password.  Masks are optional in the cancer centers. If you would like for your care team to wear a mask while they are taking care of you, please let them know. For doctor visits, patients may have with them one support person who is at least 57 years old. At this time, visitors are not allowed in the infusion area.

## 2022-02-01 NOTE — Progress Notes (Signed)
Patient tolerated Daratumumab injection with no complaints voiced. See MAR for details. Lab reviewed. Injection site clean and dry with no bruising or swelling noted at site. Band aid applied.  Patient tolerated Kyprolis with no complaints voiced. Side effects with management reviewed understanding verbalized. Port site clean and dry with no bruising or swelling noted at site. Good blood return noted before and after administration of chemotherapy. Band aid applied. Patient left in satisfactory condition with VSS and no s/s of distress noted.

## 2022-02-01 NOTE — Progress Notes (Signed)
[place], [place] [address]   CLINIC:  Medical Oncology/Hematology  PCP:  Lindell Spar, MD [address] / [place] [address] [phone]   REASON FOR VISIT:  Follow-up for multiple myeloma  PRIOR THERAPY:  1. RVD x 4 cycles from 11/01/2017 through 01/14/2018. 2. Stem cell transplant on 02/20/2018. 3. Velcade from 06/11/2018 to 12/22/2019.  NGS Results: not done  CURRENT THERAPY: Daratumumab, carfilzomib and dexamethasone  BRIEF ONCOLOGIC HISTORY:  Oncology History  Multiple myeloma ([place])  10/29/2017 Initial Diagnosis   Multiple myeloma ([place])   11/01/2017 - 01/24/2018 Chemotherapy   The patient had dexamethasone (DECADRON) 4 MG tablet, 1 of 1 cycle, Start date: --, End date: -- lenalidomide (REVLIMID) 25 MG capsule, 1 of 1 cycle, Start date: --, End date: -- bortezomib SQ (VELCADE) chemo injection 3 mg, 1.3 mg/m2 = 3 mg, Subcutaneous,  Once, 5 of 5 cycles Administration: 3 mg (11/01/2017), 3 mg (11/08/2017), 3 mg (11/05/2017), 3 mg (11/22/2017), 3 mg (11/12/2017), 3 mg (11/29/2017), 3 mg (11/26/2017), 3 mg (12/03/2017), 3 mg (12/13/2017), 3 mg (12/20/2017), 3 mg (12/17/2017), 3 mg (12/24/2017), 3 mg (01/03/2018), 3 mg (01/10/2018), 3 mg (01/07/2018), 3 mg (01/14/2018), 3 mg (01/24/2018)  for chemotherapy treatment.    06/11/2018 - 12/20/2021 Chemotherapy   Patient is on Treatment Plan : MYELOMA MAINTENANCE Bortezomib SQ q 7d x 6 weeks, two weeks off then q 14d     01/25/2022 -  Chemotherapy   Patient is on Treatment Plan : MYELOMA RELAPSED/REFRACTORY Carfilzomib (20/70) + Daratumumab SQ + Dexamethasone (20/40) DaraKd q28d       CANCER STAGING:  Cancer Staging  Multiple myeloma ([place]) Staging form: Plasma Cell Myeloma and Plasma Cell Disorders, AJCC 8th Edition - Clinical: No stage assigned - Unsigned - Clinical: No stage assigned - Unsigned   INTERVAL HISTORY:  [name], a 56 y.o. female, returns for routine follow-up and  consideration for next cycle of chemotherapy. Kassidi was last seen on 01/17/2022.  Due for day #8 cycle #1 of Daratumumab, carfilzomib and dexamethasone today.   Overall, she tells me she has been feeling pretty well. She reports intermittent mild nausea this past week. She denies pain at the injection site. Her neuropathy is stable. The tremors that started when she started Pomalyst have improved but are still present when picking up and holding objects.   Overall, she feels ready for next cycle of chemo today.    REVIEW OF SYSTEMS:  Review of Systems  Constitutional:  Negative for appetite change and fatigue.  Respiratory:  Positive for shortness of breath.   Gastrointestinal:  Positive for diarrhea and nausea (mild).  Neurological:  Positive for headaches and numbness.  All other systems reviewed and are negative.   PAST MEDICAL/SURGICAL HISTORY:  Past Medical History:  Diagnosis Date   Acid reflux    Allergic rhinitis    Cancer ([place])    multiple myeloma   Diabetes mellitus    type 2   Gout    Gout    H/o COVID-19--- was Positive 09/17/2019, Negative 12/23/19 AND also Neg on  12/26/19 12/26/2019   HBP (high blood pressure)    History of kidney stones    Migraines    Past Surgical History:  Procedure Laterality Date   BREAST CYST EXCISION Left    2009 no visible scar on skin   CESAREAN SECTION     COLONOSCOPY WITH PROPOFOL N/A 12/25/2019   Procedure: COLONOSCOPY WITH  PROPOFOL;  Surgeon: Rogene Houston, MD;  Location: [facility];  Service: Endoscopy;  Laterality: N/A;  730   EXTRACORPOREAL SHOCK WAVE LITHOTRIPSY Left 10/10/2017   Procedure: LEFT EXTRACORPOREAL SHOCK WAVE LITHOTRIPSY (ESWL);  Surgeon: Bjorn Loser, MD;  Location: [facility];  Service: Urology;  Laterality: Left;   EYE SURGERY     HEMORRHOID SURGERY N/A 11/19/2012   Procedure: HEMORRHOIDECTOMY;  Surgeon: Jamesetta So, MD;  Location: [facility];  Service: General;  Laterality: N/A;   IR IMAGING GUIDED PORT  INSERTION  01/18/2022   kidney stones  1998   LAPAROSCOPIC UNILATERAL SALPINGO OOPHERECTOMY  05/14/2012   Procedure: LAPAROSCOPIC UNILATERAL SALPINGO OOPHORECTOMY;  Surgeon: [name], MD;  Location: [facility];  Service: Gynecology;  Laterality: Right;  laparoscopic right salpingo-oophorectomy   PARTIAL HYSTERECTOMY     POLYPECTOMY  12/25/2019   Procedure: POLYPECTOMY;  Surgeon: Rogene Houston, MD;  Location: [facility];  Service: Endoscopy;;   TONSILECTOMY, ADENOIDECTOMY, BILATERAL MYRINGOTOMY AND TUBES     VESICOVAGINAL FISTULA CLOSURE W/ TAH      SOCIAL HISTORY:  Social History   Socioeconomic History   Marital status: Married    Spouse name: [name]   Number of children: 1   Years of education: 12   Highest education level: Some college, no degree  Occupational History    Employer: [employer]  Tobacco Use   Smoking status: Former    Types: Cigarettes    Quit date: 02/27/1999    Years since quitting: 22.9   Smokeless tobacco: Never   Tobacco comments:    socially   Vaping Use   Vaping Use: Never used  Substance and Sexual Activity   Alcohol use: No    Alcohol/week: 0.0 standard drinks of alcohol   Drug use: No   Sexual activity: Yes    Birth control/protection: Surgical    Comment: hyst  Other Topics Concern   Not on file  Social History Narrative   Not on file   Social Determinants of Health   Financial Resource Strain: Low Risk  (08/08/2021)   Overall Financial Resource Strain (CARDIA)    Difficulty of Paying Living Expenses: Not hard at all  Food Insecurity: No Food Insecurity (11/09/2019)   Hunger Vital Sign    Worried About Running Out of Food in the Last Year: Never true    [place] in the Last Year: Never true  Transportation Needs: No Transportation Needs (08/08/2021)   PRAPARE - Hydrologist (Medical): No    Lack of Transportation (Non-Medical): No  Physical Activity: Unknown (08/08/2021)   Exercise Vital Sign    Days of  Exercise per Week: Not on file    Minutes of Exercise per Session: 10 min  Stress: No Stress Concern Present (08/08/2021)   [place]    Feeling of Stress : Not at all  Social Connections: Stem (08/08/2021)   Social Connection and Isolation Panel [NHANES]    Frequency of Communication with Friends and Family: More than three times a week    Frequency of Social Gatherings with Friends and Family: Twice a week    Attends Religious Services: More than 4 times per year    Active Member of [employer] or Organizations: Yes    Attends Archivist Meetings: More than 4 times per year    Marital Status: Married  Human resources officer Violence: Not At Risk (08/08/2021)  Humiliation, Afraid, Rape, and Kick questionnaire    Fear of Current or Ex-Partner: No    Emotionally Abused: No    Physically Abused: No    Sexually Abused: No    FAMILY HISTORY:  Family History  Problem Relation Age of Onset   Arthritis Other    Cancer Other    Diabetes Other    Hypertension Mother    Dementia Mother    Diabetes Father    ALS Father    Diabetes Brother    Hypertension Brother    Cancer Paternal Aunt    COPD Maternal Grandmother    Cancer Maternal Grandfather    Anesthesia problems Paternal Grandfather     CURRENT MEDICATIONS:  Current Outpatient Medications  Medication Sig Dispense Refill   acyclovir (ZOVIRAX) 400 MG tablet TAKE (1) TABLET BY MOUTH TWICE DAILY. 60 tablet 0   albuterol (VENTOLIN HFA) 108 (90 Base) MCG/ACT inhaler Inhale 2 puffs into the lungs every 4 (four) hours as needed for wheezing or shortness of breath. 1 each 3   allopurinol (ZYLOPRIM) 300 MG tablet TAKE (1) TABLET BY MOUTH ONCE DAILY. 90 tablet 0   amLODipine (NORVASC) 2.5 MG tablet Take 2.5 mg by mouth daily.     aspirin EC 81 MG tablet Take 1 tablet (81 mg total) by mouth daily. 30 tablet 11   bumetanide (BUMEX) 0.5 MG tablet Take 0.5 mg by  mouth daily.      carvedilol (COREG) 25 MG tablet Take 1 tablet (25 mg total) by mouth 2 (two) times daily.     diazepam (VALIUM) 5 MG tablet TAKE (1) TABLET BY MOUTH AT BEDTIME. 30 tablet 0   diclofenac Sodium (VOLTAREN) 1 % GEL Apply 2 g topically 4 (four) times daily. 100 g 0   dicyclomine (BENTYL) 20 MG tablet Take 1 tablet (20 mg total) by mouth 2 (two) times daily as needed for spasms. 20 tablet 0   Docosanol 10 % CREA Apply 1 application topically daily as needed (for nose).      docusate sodium (COLACE) 100 MG capsule Take 100 mg by mouth daily.      erythromycin ophthalmic ointment Place 1 application into the left eye at bedtime. 3.5 g 0   escitalopram (LEXAPRO) 10 MG tablet TAKE (1) TABLET BY MOUTH ONCE DAILY. MAY START WITH 1/2 TABLET FOR 7 DAYS. 90 tablet 0   fluticasone (FLONASE) 50 MCG/ACT nasal spray INSTILL 2 SPRAYS INTO BOTH NOSTRILS DAILY 16 g 4   Lactulose 20 GM/30ML SOLN Take 30 mLs (20 g total) by mouth daily. 450 mL 1   lidocaine-prilocaine (EMLA) cream Apply 1 Application topically as needed (Apply to port prior to treatment and flushes). 30 g 6   losartan (COZAAR) 100 MG tablet Take 100 mg by mouth daily.     magnesium oxide (MAG-OX) 400 (241.3 Mg) MG tablet Take 1 tablet (400 mg total) by mouth 3 (three) times daily. 90 tablet 3   metFORMIN (GLUCOPHAGE) 500 MG tablet Take 500 mg by mouth 2 (two) times daily.     nitrofurantoin, macrocrystal-monohydrate, (MACROBID) 100 MG capsule TAKE (1) CAPSULE BY MOUTH AT BEDTIME. 90 capsule 0   ondansetron (ZOFRAN) 8 MG tablet TAKE 1 TABLET BY MOUTH EVERY 8 HOURS AS NEEDED. 30 tablet 6   oxybutynin (DITROPAN XL) 10 MG 24 hr tablet Take 1 tablet (10 mg total) by mouth at bedtime. 30 tablet 5   pantoprazole (PROTONIX) 40 MG tablet TAKE (1) TABLET BY MOUTH ONCE DAILY. 90 tablet  0   POMALYST 2 MG capsule TAKE 1 CAPSULE BY MOUTH ONCE DAILY FOR 21 DAYS ON AND 7 DAYS OFF 21 capsule 0   pregabalin (LYRICA) 200 MG capsule TAKE (1) CAPSULE BY  MOUTH TWICE DAILY. 60 capsule 11   prochlorperazine (COMPAZINE) 10 MG tablet TAKE 1 TABLET BY MOUTH EVERY 6 HOURS AS NEEDED FOR NAUSEA OR VOMITING. 30 tablet 6   Propylene Glycol (SYSTANE BALANCE) 0.6 % SOLN Apply 1 drop to eye daily as needed (dry eye).      rosuvastatin (CRESTOR) 5 MG tablet Take 5 mg by mouth daily.     TRULICITY 0.88 PJ/0.3PR SOPN SMARTSIG:0.5 Milliliter(s) SUB-Q Once a Week     valACYclovir (VALTREX) 1000 MG tablet TAKE 2 TABLETS BY MOUTH NOW; THEN 2 12 HOURS LATER. 12 tablet 0   VELCADE 3.5 MG injection RECONSTITUTE EACH VIAL AS DIRECTED. INJECT 2.5 MG UNDER THE SKIN EVERY 14 DAYS. 2 each 2   No current facility-administered medications for this visit.    ALLERGIES:  No Known Allergies  PHYSICAL EXAM:  Performance status (ECOG): 1 - Symptomatic but completely ambulatory  There were no vitals filed for this visit. Wt Readings from Last 3 Encounters:  01/25/22 267 lb 6.7 oz (121.3 kg)  01/24/22 266 lb (120.7 kg)  01/18/22 264 lb (119.7 kg)   Physical Exam Vitals reviewed.  Constitutional:      Appearance: Normal appearance. She is obese.  Cardiovascular:     Rate and Rhythm: Normal rate and regular rhythm.     Pulses: Normal pulses.     Heart sounds: Normal heart sounds.  Pulmonary:     Effort: Pulmonary effort is normal.     Breath sounds: Normal breath sounds.  Neurological:     General: No focal deficit present.     Mental Status: She is alert and oriented to person, place, and time.  Psychiatric:        Mood and Affect: Mood normal.        Behavior: Behavior normal.     LABORATORY DATA:  I have reviewed the labs as listed.     Latest Ref Rng & Units 01/30/2022    9:00 AM 01/25/2022    8:28 AM 01/22/2022   11:29 AM  CBC  WBC 3.4 - 10.8 x10E3/uL 5.7  5.3  5.0   Hemoglobin 11.1 - 15.9 g/dL 8.9  8.8  8.6   Hematocrit 34.0 - 46.6 % 28.4  28.8  27.6   Platelets 150 - 450 x10E3/uL 189  257  229       Latest Ref Rng & Units 01/30/2022    9:00 AM  01/25/2022    8:25 AM 01/22/2022   11:29 AM  CMP  Glucose 70 - 99 mg/dL 118  225  138   BUN 6 - 24 mg/dL _0 Creatinine 0.57 - 1.00 mg/dL 0.97  0.92  0.84   Sodium 134 - 144 mmol/L 137  139  138   Potassium 3.5 - 5.2 mmol/L 4.3  4.2  4.4   Chloride 96 - 106 mmol/L 102  106  106   CO2 20 - 29 mmol/L _1 Calcium 8.7 - 10.2 mg/dL 9.2  8.8  8.7   Total Protein 6.0 - 8.5 g/dL 6.0  6.5    Total Bilirubin 0.0 - 1.2 mg/dL 0.3  0.6    Alkaline Phos 44 - 121 IU/L 120  109  AST 0 - 40 IU/L 14  21    ALT 0 - 32 IU/L 13  14      DIAGNOSTIC IMAGING:  I have independently reviewed the scans and discussed with the patient. DG Forearm Right  Result Date: 01/22/2022 CLINICAL DATA:  Right forearm pain status post catheter placement. EXAM: RIGHT FOREARM - 2 VIEW COMPARISON:  None Available. FINDINGS: There are no signs of acute fracture or dislocation. There is a small cluster of radiopacities along the dorsal soft tissues of the proximal forearm which may be external to the patient versus dystrophic calcifications perhaps secondary to prior trauma. Soft tissues are otherwise unremarkable. IMPRESSION: 1. No acute osseous abnormality. 2. Small cluster of radiopacities along the dorsal soft tissues of the proximal forearm may be external to the patient versus dystrophic calcifications perhaps secondary to prior trauma. Electronically Signed   By: Kerby Moors M.D.   On: 01/22/2022 14:13   DG Humerus Right  Result Date: 01/22/2022 CLINICAL DATA:  Right arm pain status post porta catheter placement. EXAM: RIGHT HUMERUS - 2+ VIEW COMPARISON:  None Available. FINDINGS: There is no evidence of fracture or other focal bone lesions. Soft tissues are unremarkable. IMPRESSION: Negative. Electronically Signed   By: Kerby Moors M.D.   On: 01/22/2022 14:11   DG Chest Portable 1 View  Result Date: 01/22/2022 CLINICAL DATA:  Port-A-Cath placement. EXAM: PORTABLE CHEST 1 VIEW COMPARISON:  August 11, 2018. FINDINGS: Stable cardiomediastinal silhouette. Interval placement of right internal jugular Port-A-Cath with distal tip in expected position of the SVC. No pneumothorax or pleural effusion is noted. Lungs are clear. Bony thorax is unremarkable. IMPRESSION: Interval placement of right internal jugular Port-A-Cath with distal tip in expected position of the SVC. Electronically Signed   By: Marijo Conception M.D.   On: 01/22/2022 12:16   US Venous Img Upper Uni Right(DVT)  Result Date: 01/22/2022 CLINICAL DATA:  Right upper extremity swelling. EXAM: RIGHT UPPER EXTREMITY VENOUS DOPPLER ULTRASOUND TECHNIQUE: Gray-scale sonography with graded compression, as well as color Doppler and duplex ultrasound were performed to evaluate the upper extremity deep venous system from the level of the subclavian vein and including the jugular, axillary, basilic, radial, ulnar and upper cephalic vein. Spectral Doppler was utilized to evaluate flow at rest and with distal augmentation maneuvers. COMPARISON:  None Available. FINDINGS: Contralateral Subclavian Vein: Respiratory phasicity is normal and symmetric with the symptomatic side. No evidence of thrombus. Normal compressibility. Internal Jugular Vein: No evidence of thrombus. Normal compressibility, respiratory phasicity and response to augmentation. Subclavian Vein: No evidence of thrombus. Normal compressibility, respiratory phasicity and response to augmentation. Axillary Vein: No evidence of thrombus. Normal compressibility, respiratory phasicity and response to augmentation. Cephalic Vein: No evidence of thrombus. Normal compressibility, respiratory phasicity and response to augmentation. Basilic Vein: No evidence of thrombus. Normal compressibility, respiratory phasicity and response to augmentation. Brachial Veins: No evidence of thrombus. Normal compressibility, respiratory phasicity and response to augmentation. Radial Veins: No evidence of thrombus. Normal  compressibility, respiratory phasicity and response to augmentation. Ulnar Veins: No evidence of thrombus. Normal compressibility, respiratory phasicity and response to augmentation. None Other: The region of painful palpable abnormality there multiple nonspecific small (subcentimeter hyperechoic regions, possibly areas of fat. IMPRESSION: No evidence of DVT within the right upper extremity. Electronically Signed   By: Margaretha Sheffield M.D.   On: 01/22/2022 12:07   IR IMAGING GUIDED PORT INSERTION  Result Date: 01/18/2022 CLINICAL DATA:  Multiple myeloma and need for porta cath for  chemotherapy. EXAM: IMPLANTED PORT A CATH PLACEMENT WITH ULTRASOUND AND FLUOROSCOPIC GUIDANCE ANESTHESIA/SEDATION: Moderate (conscious) sedation was employed during this procedure. A total of Versed 2.0 mg and Fentanyl 75 mcg was administered intravenously. Moderate Sedation Time: 37 minutes. The patient's level of consciousness and vital signs were monitored continuously by radiology nursing throughout the procedure under my direct supervision. FLUOROSCOPY: 1.0 minutes.  11.0 mGy. PROCEDURE: The procedure, risks, benefits, and alternatives were explained to the patient. Questions regarding the procedure were encouraged and answered. The patient understands and consents to the procedure. A time-out was performed prior to initiating the procedure. Ultrasound was utilized to confirm patency of the right internal jugular vein. A permanent ultrasound image was recorded. The right neck and chest were prepped with chlorhexidine in a sterile fashion, and a sterile drape was applied covering the operative field. Maximum barrier sterile technique with sterile gowns and gloves were used for the procedure. Local anesthesia was provided with 1% lidocaine. After creating a small venotomy incision, a 21 gauge needle was advanced into the right internal jugular vein under direct, real-time ultrasound guidance. Ultrasound image documentation was  performed. After securing guidewire access, an 8 Fr dilator was placed. A J-wire was kinked to measure appropriate catheter length. A subcutaneous port pocket was then created along the upper chest wall utilizing sharp and blunt dissection. Portable cautery was utilized. The pocket was irrigated with sterile saline. A single lumen power injectable port was chosen for placement. The 8 Fr catheter was tunneled from the port pocket site to the venotomy incision. The port was placed in the pocket. External catheter was trimmed to appropriate length based on guidewire measurement. At the venotomy, an 8 Fr peel-away sheath was placed over a guidewire. The catheter was then placed through the sheath and the sheath removed. Final catheter positioning was confirmed and documented with a fluoroscopic spot image. The port was accessed with a needle and aspirated and flushed with heparinized saline. The access needle was removed. The venotomy and port pocket incisions were closed with subcutaneous 3-0 Monocryl and subcuticular 4-0 Vicryl. Dermabond was applied to both incisions. COMPLICATIONS: COMPLICATIONS None FINDINGS: After catheter placement, the tip lies at the cavo-atrial junction. The catheter aspirates normally and is ready for immediate use. IMPRESSION: Placement of single lumen port a cath via right internal jugular vein. The catheter tip lies at the cavo-atrial junction. A power injectable port a cath was placed and is ready for immediate use. Electronically Signed   By: Aletta Edouard M.D.   On: 01/18/2022 15:14   ECHOCARDIOGRAM COMPLETE  Result Date: 01/16/2022    ECHOCARDIOGRAM REPORT   Patient Name:   Joanna Reid Date of Exam: 01/16/2022 Medical Rec #:  [id number]        Height:       64.0 in Accession #:    [id number]       Weight:       263.9 lb Date of Birth:  25-Jul-1965        BSA:          2.201 m Patient Age:    56 years         BP:           142/85 mmHg Patient Gender: F                HR:            82 bpm. Exam Location:  [facility] Procedure: 2D Echo, Cardiac Doppler and Color Doppler  Indications:    C90.00 (ICD-10-CM) - Multiple myeloma not having achieved                 remission [facility])  History:        Patient has prior history of Echocardiogram examinations, most                 recent 12/26/2019. Risk Factors:Former Smoker, Diabetes,                 Hypertension and Dyslipidemia. History of CVA with residual                 deficit, LVH (left ventricular hypertrophy), Hx of COVID-19,                 Cancer ([place]) (From Hx).  Sonographer:    Alvino Chapel RCS Referring Phys: [phone] [place]  1. Left ventricular ejection fraction, by estimation, is 65 to 70%. The left ventricle has normal function. There is mild concentric left ventricular hypertrophy. Left ventricular diastolic parameters are indeterminate.  2. Right ventricular systolic function is normal. The right ventricular size is normal. There is normal pulmonary artery systolic pressure.  3. The mitral valve is normal in structure. Mild mitral valve regurgitation.  4. The aortic valve is tricuspid. Aortic valve regurgitation is mild.  5. The inferior vena cava is normal in size with greater than 50% respiratory variability, suggesting right atrial pressure of 3 mmHg. Comparison(s): The left ventricular function is unchanged. FINDINGS  Left Ventricle: Left ventricular ejection fraction, by estimation, is 65 to 70%. The left ventricle has normal function. The left ventricular internal cavity size was normal in size. There is mild concentric left ventricular hypertrophy. Left ventricular diastolic parameters are indeterminate. Right Ventricle: The right ventricular size is normal. Right vetricular wall thickness was not assessed. Right ventricular systolic function is normal. There is normal pulmonary artery systolic pressure. The tricuspid regurgitant velocity is 1.82 m/s, and with an assumed right atrial pressure of 3 mmHg,  the estimated right ventricular systolic pressure is 86.7 mmHg. Left Atrium: Left atrial size was normal in size. Right Atrium: Right atrial size was normal in size. Pericardium: There is no evidence of pericardial effusion. Mitral Valve: The mitral valve is normal in structure. Mild mitral valve regurgitation. Tricuspid Valve: The tricuspid valve is normal in structure. Tricuspid valve regurgitation is trivial. Aortic Valve: The aortic valve is tricuspid. Aortic valve regurgitation is mild. Aortic regurgitation PHT measures 413 msec. Aortic valve mean gradient measures 7.0 mmHg. Aortic valve peak gradient measures 13.4 mmHg. Aortic valve area, by VTI measures 2.43 cm. Pulmonic Valve: The pulmonic valve was normal in structure. Pulmonic valve regurgitation is not visualized. Aorta: The aortic root is normal in size and structure. Venous: The inferior vena cava is normal in size with greater than 50% respiratory variability, suggesting right atrial pressure of 3 mmHg. IAS/Shunts: No atrial level shunt detected by color flow Doppler.  LEFT VENTRICLE PLAX 2D LVIDd:         5.10 cm   Diastology LVIDs:         2.90 cm   LV e' medial:    5.22 cm/s LV PW:         1.40 cm   LV E/e' medial:  16.4 LV IVS:        1.40 cm   LV e' lateral:   7.18 cm/s LVOT diam:     2.20 cm   LV E/e' lateral: 11.9 LV  SV:         90 LV SV Index:   41 LVOT Area:     3.80 cm  RIGHT VENTRICLE RV S prime:     20.10 cm/s TAPSE (M-mode): 2.1 cm LEFT ATRIUM             Index        RIGHT ATRIUM           Index LA diam:        4.20 cm 1.91 cm/m   RA Area:     16.10 cm LA Vol (A2C):   85.0 ml 38.62 ml/m  RA Volume:   42.80 ml  19.45 ml/m LA Vol (A4C):   76.6 ml 34.80 ml/m LA Biplane Vol: 84.0 ml 38.16 ml/m  AORTIC VALVE AV Area (Vmax):    2.31 cm AV Area (Vmean):   2.15 cm AV Area (VTI):     2.43 cm AV Vmax:           183.00 cm/s AV Vmean:          115.000 cm/s AV VTI:            0.371 m AV Peak Grad:      13.4 mmHg AV Mean Grad:      7.0 mmHg  LVOT Vmax:         111.00 cm/s LVOT Vmean:        65.100 cm/s LVOT VTI:          0.237 m LVOT/AV VTI ratio: 0.64 AI PHT:            413 msec  AORTA Ao Root diam: 3.70 cm MITRAL VALVE                TRICUSPID VALVE MV Area (PHT): 2.42 cm     TR Peak grad:   13.2 mmHg MV Decel Time: 313 msec     TR Vmax:        182.00 cm/s MV E velocity: 85.70 cm/s MV A velocity: 103.00 cm/s  SHUNTS MV E/A ratio:  0.83         Systemic VTI:  0.24 m                             Systemic Diam: 2.20 cm Dorris Carnes MD Electronically signed by Dorris Carnes MD Signature Date/Time: 01/16/2022/5:49:43 PM    Final      ASSESSMENT:  1.  IgG lambda multiple myeloma, stage III, del 17 p: -4 cycles of RVD from 11/01/2017 through 01/14/2018. -Stem cell transplant on 02/20/2018 at [facility]. -Maintenance Velcade every 2 weeks and Revlimid 10 mg 3 weeks on/1 week off started on 07/29/2018. -Myeloma panel from 12/08/2018 shows M spike not observed.  Kappa light chains of 32.1 with ratio of 1.64.  Immunofixation was negative. 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  MRI of the brain on 12/27/2019 shows acute ischemic nonhemorrhagic left MCA territory infarct involving left parietal lobe, corresponding with perfusion deficit on CT scan angiogram.  No associated hemorrhage or mass-effect.  Additional few scattered punctate acute ischemic nonhemorrhagic infarcts involving bilateral frontal and parietal lobes as well as left cerebellum. - Revlimid was on hold since June 2021 due to potential for contributing to CVA. - Velcade dose was reduced to 1 mg per metered square on 12/28/2020 due to worsening neuropathy in the feet.,  Pomalidomide 2 mg 3 weeks on/1 week off started around 09/20/2021, discontinued  on 01/17/2022 due to progression. - Daratumumab, carfilzomib and dexamethasone (DKd) started on   2.  CVA with aphasia: -We held her myeloma treatments since CVA with aphasia on 12/26/2019. -Her aphasia is improving.   PLAN:  1.  IgG lambda multiple myeloma,  stage III, del 17 p: - Cycle 1 day 1 of daratumumab, carfilzomib and dexamethasone started on 01/25/2022. - Reviewed labs today which showed creatinine 1.11.  LFTs are normal.  Albumin is low at 3.  CBC shows hemoglobin 8.2. - We will check ferritin and iron panel next week. - She has some nausea and will take Compazine.  She may proceed with cycle 1 day 8 treatment today. - RTC 3 weeks for follow-up prior to cycle 2 with repeat myeloma labs.   2.  Hypomagnesemia: - Continue magnesium 3 times daily.   3.  Bone strengthening agents: - Continue Zometa every 4 weeks.   4.  Peripheral neuropathy: - Continue Lyrica 200 mg twice daily.   5.  ID prophylaxis: - Continue acyclovir twice daily.   6.  Diabetes: - Continue metformin daily and Trulicity weekly.   7.  Recurrent UTIs: - Continue Macrobid 100 mg daily for prophylaxis.   Orders placed this encounter:  No orders of the defined types were placed in this encounter.    Derek Jack, MD [place] [phone]   I, Thana Ates, am acting as a scribe for Dr. Derek Jack.  I, Derek Jack MD, have reviewed the above documentation for accuracy and completeness, and I agree with the above.

## 2022-02-02 ENCOUNTER — Other Ambulatory Visit (HOSPITAL_COMMUNITY): Payer: Self-pay

## 2022-02-02 DIAGNOSIS — C9000 Multiple myeloma not having achieved remission: Secondary | ICD-10-CM

## 2022-02-02 NOTE — Progress Notes (Signed)
Message received from Dr. Delton Coombes- Please add ferritin, iron panel, Z61 and folic acid for labs next week on this patient.  Thanks

## 2022-02-04 ENCOUNTER — Encounter (HOSPITAL_COMMUNITY): Payer: Self-pay | Admitting: Hematology

## 2022-02-04 LAB — LIPID PANEL
Chol/HDL Ratio: 3 ratio (ref 0.0–4.4)
Cholesterol, Total: 105 mg/dL (ref 100–199)
HDL: 35 mg/dL — ABNORMAL LOW (ref >39)
LDL Chol Calc (NIH): 43 mg/dL (ref 0–99)
Triglycerides: 160 mg/dL — ABNORMAL HIGH (ref 0–149)
VLDL Cholesterol Cal: 27 mg/dL (ref 5–40)

## 2022-02-04 LAB — CMP14+EGFR
ALT: 13 IU/L (ref 0–32)
AST: 14 IU/L (ref 0–40)
Albumin/Globulin Ratio: 1.3 (ref 1.2–2.2)
Albumin: 3.4 g/dL — ABNORMAL LOW (ref 3.8–4.9)
Alkaline Phosphatase: 120 IU/L (ref 44–121)
BUN/Creatinine Ratio: 19 (ref 9–23)
BUN: 18 mg/dL (ref 6–24)
Bilirubin Total: 0.3 mg/dL (ref 0.0–1.2)
CO2: 21 mmol/L (ref 20–29)
Calcium: 9.2 mg/dL (ref 8.7–10.2)
Chloride: 102 mmol/L (ref 96–106)
Creatinine, Ser: 0.97 mg/dL (ref 0.57–1.00)
Globulin, Total: 2.6 g/dL (ref 1.5–4.5)
Glucose: 118 mg/dL — ABNORMAL HIGH (ref 70–99)
Potassium: 4.3 mmol/L (ref 3.5–5.2)
Sodium: 137 mmol/L (ref 134–144)
Total Protein: 6 g/dL (ref 6.0–8.5)
eGFR: 68 mL/min/{1.73_m2} (ref >59)

## 2022-02-04 LAB — CBC WITH DIFFERENTIAL/PLATELET
Basophils Absolute: 0 10*3/uL (ref 0.0–0.2)
Basos: 0 %
EOS (ABSOLUTE): 0.1 10*3/uL (ref 0.0–0.4)
Eos: 1 %
Hematocrit: 28.4 % — ABNORMAL LOW (ref 34.0–46.6)
Hemoglobin: 8.9 g/dL — ABNORMAL LOW (ref 11.1–15.9)
Immature Grans (Abs): 0 10*3/uL (ref 0.0–0.1)
Immature Granulocytes: 1 %
Lymphocytes Absolute: 1.5 10*3/uL (ref 0.7–3.1)
Lymphs: 26 %
MCH: 27 pg (ref 26.6–33.0)
MCHC: 31.3 g/dL — ABNORMAL LOW (ref 31.5–35.7)
MCV: 86 fL (ref 79–97)
Monocytes Absolute: 0.9 10*3/uL (ref 0.1–0.9)
Monocytes: 16 %
NRBC: 1 % — ABNORMAL HIGH (ref 0–0)
Neutrophils Absolute: 3.2 10*3/uL (ref 1.4–7.0)
Neutrophils: 56 %
Platelets: 189 10*3/uL (ref 150–450)
RBC: 3.3 x10E6/uL — ABNORMAL LOW (ref 3.77–5.28)
RDW: 16.9 % — ABNORMAL HIGH (ref 11.7–15.4)
WBC: 5.7 10*3/uL (ref 3.4–10.8)

## 2022-02-04 LAB — TSH: TSH: 5.38 u[IU]/mL — ABNORMAL HIGH (ref 0.450–4.500)

## 2022-02-04 LAB — HEMOGLOBIN A1C
Est. average glucose Bld gHb Est-mCnc: 143 mg/dL
Hgb A1c MFr Bld: 6.6 % — ABNORMAL HIGH (ref 4.8–5.6)

## 2022-02-04 LAB — VITAMIN D 25 HYDROXY (VIT D DEFICIENCY, FRACTURES): Vit D, 25-Hydroxy: 9.4 ng/mL — ABNORMAL LOW (ref 30.0–100.0)

## 2022-02-04 LAB — HEPATITIS C ANTIBODY: Hep C Virus Ab: NONREACTIVE

## 2022-02-05 ENCOUNTER — Encounter: Payer: Self-pay | Admitting: Internal Medicine

## 2022-02-05 ENCOUNTER — Ambulatory Visit (INDEPENDENT_AMBULATORY_CARE_PROVIDER_SITE_OTHER): Payer: BC Managed Care – PPO | Admitting: Internal Medicine

## 2022-02-05 ENCOUNTER — Other Ambulatory Visit: Payer: Self-pay | Admitting: Hematology

## 2022-02-05 VITALS — BP 122/82 | HR 77 | Resp 18 | Ht 64.0 in | Wt 266.6 lb

## 2022-02-05 DIAGNOSIS — E1169 Type 2 diabetes mellitus with other specified complication: Secondary | ICD-10-CM | POA: Diagnosis not present

## 2022-02-05 DIAGNOSIS — Z0001 Encounter for general adult medical examination with abnormal findings: Secondary | ICD-10-CM | POA: Diagnosis not present

## 2022-02-05 DIAGNOSIS — K219 Gastro-esophageal reflux disease without esophagitis: Secondary | ICD-10-CM

## 2022-02-05 DIAGNOSIS — I10 Essential (primary) hypertension: Secondary | ICD-10-CM | POA: Diagnosis not present

## 2022-02-05 DIAGNOSIS — E559 Vitamin D deficiency, unspecified: Secondary | ICD-10-CM

## 2022-02-05 DIAGNOSIS — D508 Other iron deficiency anemias: Secondary | ICD-10-CM

## 2022-02-05 DIAGNOSIS — E038 Other specified hypothyroidism: Secondary | ICD-10-CM

## 2022-02-05 NOTE — Assessment & Plan Note (Signed)
Likely steroid-induced Lab Results  Component Value Date   HGBA1C 6.6 (H) 01/30/2022   Followed by endocrinology at [facility] On metformin and Trulicity On ISS now On statin and ARB Advised to follow diabetic diet Diabetic eye exam: Advised to follow up with Ophthalmology for diabetic eye exam

## 2022-02-05 NOTE — Assessment & Plan Note (Signed)
Followed by heme-onc Planned to get iron transfusions No signs of active bleeding currently

## 2022-02-05 NOTE — Patient Instructions (Signed)
Please continue to take medications as prescribed.  Please follow low carb diet and take insulin as per sliding scale.  Please start taking Vitamin D 5000 IU once daily.

## 2022-02-05 NOTE — Assessment & Plan Note (Signed)
Physical exam as documented. Fasting blood tests reviewed. 

## 2022-02-05 NOTE — Assessment & Plan Note (Signed)
BP Readings from Last 1 Encounters:  02/05/22 122/82   Well-controlled with Amlodipine, Losartan and Coreg Followed by Cardiology at [facility] for compliance with the medications Advised DASH diet and moderate exercise/walking as tolerated

## 2022-02-05 NOTE — Assessment & Plan Note (Signed)
Likely due to recent stress - undergoing chemotherapy Recheck TSH and free T4 after 3 months

## 2022-02-05 NOTE — Assessment & Plan Note (Signed)
Last vitamin D Lab Results  Component Value Date   VD25OH 9.4 (L) 01/30/2022   Advised to take vitamin D 5000 IU daily.

## 2022-02-05 NOTE — Progress Notes (Signed)
Established Patient Office Visit  Subjective:  Patient ID: Joanna Reid, female    DOB: 03-13-1966  Age: 56 y.o. MRN: [id number]  CC:  Chief Complaint  Patient presents with   Annual Exam    Annual exam pt is severely anemic and sob due to anemia she is nauseated and started chemo 3 weeks ago and she is having back pain from her cancer     HPI Joanna Reid is a 56 y.o. 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    HTN: BP is well-controlled. Takes medications regularly. Patient denies headache, dizziness, chest pain, dyspnea or palpitations.   She is undergoing chemotherapy for MM, and is followed by oncology.  She feels extremely tired and dyspnea on exertion.  Of note, her Hb has dropped recently to 8.2.  She denies any active signs of bleeding currently. She has seen spine specialist for nontraumatic T12-L1 fracture, and has started Celebrex, which has helped her pain.  She denies any weakness of the LE currently.  DM: She developed steroid-induced hyperglycemia during treatment for MM.  Her HbA1c is 6.6 now.  She is on metformin and Trulicity.  Her dose of Trulicity was recently increased and she was also given ISS as she is getting dexamethasone with her chemotherapy. She sees endocrinology at [facility].  She denies any polyuria or polydipsia.  She has a history of anxiety, for which she takes Lexapro and as needed Valium.  She denies any anhedonia, SI or HI currently.     Past Medical History:  Diagnosis Date   Acid reflux    Allergic rhinitis    Cancer ([place])    multiple myeloma   Diabetes mellitus    type 2   Gout    Gout    H/o COVID-19--- was Positive 09/17/2019, Negative 12/23/19 AND also Neg on  12/26/19 12/26/2019   HBP (high blood pressure)    History of kidney stones    Migraines     Past  Surgical History:  Procedure Laterality Date   BREAST CYST EXCISION Left    2009 no visible scar on skin   CESAREAN SECTION     COLONOSCOPY WITH PROPOFOL N/A 12/25/2019   Procedure: COLONOSCOPY WITH PROPOFOL;  Surgeon: Rogene Houston, MD;  Location: [facility];  Service: Endoscopy;  Laterality: N/A;  730   EXTRACORPOREAL SHOCK WAVE LITHOTRIPSY Left 10/10/2017   Procedure: LEFT EXTRACORPOREAL SHOCK WAVE LITHOTRIPSY (ESWL);  Surgeon: Bjorn Loser, MD;  Location: [facility] ORS;  Service: Urology;  Laterality: Left;   EYE SURGERY     HEMORRHOID SURGERY N/A 11/19/2012   Procedure: HEMORRHOIDECTOMY;  Surgeon: Jamesetta So, MD;  Location: [facility] ORS;  Service: General;  Laterality: N/A;   IR IMAGING GUIDED PORT INSERTION  01/18/2022   kidney stones  1998   LAPAROSCOPIC UNILATERAL SALPINGO OOPHERECTOMY  05/14/2012   Procedure: LAPAROSCOPIC UNILATERAL SALPINGO OOPHORECTOMY;  Surgeon: [name], MD;  Location: [facility] OR[facility];  Service: Gynecology;  Laterality: Right;  laparoscopic right salpingo-oophorectomy   PARTIAL HYSTERECTOMY     POLYPECTOMY  12/25/2019   Procedure: POLYPECTOMY;  Surgeon: Rogene Houston, MD;  Location: [facility];  Service: Endoscopy;;   TONSILECTOMY, ADENOIDECTOMY, BILATERAL MYRINGOTOMY AND TUBES     VESICOVAGINAL FISTULA CLOSURE W/ TAH      Family History  Problem Relation Age of Onset  Arthritis Other    Cancer Other    Diabetes Other    Hypertension Mother    Dementia Mother    Diabetes Father    ALS Father    Diabetes Brother    Hypertension Brother    Cancer Paternal Aunt    COPD Maternal Grandmother    Cancer Maternal Grandfather    Anesthesia problems Paternal Grandfather     Social History   Socioeconomic History   Marital status: Married    Spouse name: [name]   Number of children: 1   Years of education: 12   Highest education level: Some college, no degree  Occupational History    Employer: [employer]  Tobacco Use   Smoking status: Former    Types:  Cigarettes    Quit date: 02/27/1999    Years since quitting: 22.9   Smokeless tobacco: Never   Tobacco comments:    socially   Vaping Use   Vaping Use: Never used  Substance and Sexual Activity   Alcohol use: No    Alcohol/week: 0.0 standard drinks of alcohol   Drug use: No   Sexual activity: Yes    Birth control/protection: Surgical    Comment: hyst  Other Topics Concern   Not on file  Social History Narrative   Not on file   Social Determinants of Health   Financial Resource Strain: Low Risk  (08/08/2021)   Overall Financial Resource Strain (CARDIA)    Difficulty of Paying Living Expenses: Not hard at all  Food Insecurity: No Food Insecurity (11/09/2019)   Hunger Vital Sign    Worried About Running Out of Food in the Last Year: Never true    [place] in the Last Year: Never true  Transportation Needs: No Transportation Needs (08/08/2021)   PRAPARE - Hydrologist (Medical): No    Lack of Transportation (Non-Medical): No  Physical Activity: Unknown (08/08/2021)   Exercise Vital Sign    Days of Exercise per Week: Not on file    Minutes of Exercise per Session: 10 min  Stress: No Stress Concern Present (08/08/2021)   [place]    Feeling of Stress : Not at all  Social Connections: [place] (08/08/2021)   Social Connection and Isolation Panel [NHANES]    Frequency of Communication with Friends and Family: More than three times a week    Frequency of Social Gatherings with Friends and Family: Twice a week    Attends Religious Services: More than 4 times per year    Active Member of [employer] or Organizations: Yes    Attends Music therapist: More than 4 times per year    Marital Status: Married  Human resources officer Violence: Not At Risk (08/08/2021)   Humiliation, Afraid, Rape, and Kick questionnaire    Fear of Current or Ex-Partner: No    Emotionally Abused: No     Physically Abused: No    Sexually Abused: No    Outpatient Medications Prior to Visit  Medication Sig Dispense Refill   acyclovir (ZOVIRAX) 400 MG tablet TAKE (1) TABLET BY MOUTH TWICE DAILY. 60 tablet 0   albuterol (VENTOLIN HFA) 108 (90 Base) MCG/ACT inhaler Inhale 2 puffs into the lungs every 4 (four) hours as needed for wheezing or shortness of breath. 1 each 3   allopurinol (ZYLOPRIM) 300 MG tablet TAKE (1) TABLET BY MOUTH ONCE DAILY. 90 tablet 0   amLODipine (NORVASC) 2.5 MG  tablet Take 2.5 mg by mouth daily.     aspirin EC 81 MG tablet Take 1 tablet (81 mg total) by mouth daily. 30 tablet 11   BD PEN NEEDLE NANO U/F 32G X 4 MM MISC Inject into the skin.     bumetanide (BUMEX) 0.5 MG tablet Take 0.5 mg by mouth daily.      carvedilol (COREG) 25 MG tablet Take 1 tablet (25 mg total) by mouth 2 (two) times daily.     DARZALEX FASPRO 1800-30000 MG-UT/15ML SOLN Inject into the skin.     diazepam (VALIUM) 5 MG tablet TAKE (1) TABLET BY MOUTH AT BEDTIME. 30 tablet 0   diclofenac Sodium (VOLTAREN) 1 % GEL Apply 2 g topically 4 (four) times daily. 100 g 0   dicyclomine (BENTYL) 20 MG tablet Take 1 tablet (20 mg total) by mouth 2 (two) times daily as needed for spasms. 20 tablet 0   Docosanol 10 % CREA Apply 1 application topically daily as needed (for nose).      docusate sodium (COLACE) 100 MG capsule Take 100 mg by mouth daily.      Dulaglutide 1.5 MG/0.5ML SOPN Inject 1.5 mg into the skin every 7 (seven) days.     erythromycin ophthalmic ointment Place 1 application into the left eye at bedtime. 3.5 g 0   escitalopram (LEXAPRO) 10 MG tablet TAKE (1) TABLET BY MOUTH ONCE DAILY. MAY START WITH 1/2 TABLET FOR 7 DAYS. 90 tablet 0   fluticasone (FLONASE) 50 MCG/ACT nasal spray INSTILL 2 SPRAYS INTO BOTH NOSTRILS DAILY 16 g 4   Insulin Lispro w/ Trans Port 100 UNIT/ML SOPN Inject before meals per sliding scale. Max TDD 20 units     KYPROLIS 30 MG SOLR Inject into the vein.     KYPROLIS 60 MG  SOLR Inject into the vein.     Lactulose 20 GM/30ML SOLN Take 30 mLs (20 g total) by mouth daily. 450 mL 1   lidocaine-prilocaine (EMLA) cream Apply 1 Application topically as needed (Apply to port prior to treatment and flushes). 30 g 6   losartan (COZAAR) 100 MG tablet Take 100 mg by mouth daily.     magnesium oxide (MAG-OX) 400 (241.3 Mg) MG tablet Take 1 tablet (400 mg total) by mouth 3 (three) times daily. 90 tablet 3   nitrofurantoin, macrocrystal-monohydrate, (MACROBID) 100 MG capsule TAKE (1) CAPSULE BY MOUTH AT BEDTIME. 90 capsule 0   NOVOLOG FLEXPEN 100 UNIT/ML FlexPen Inject into the skin.     ondansetron (ZOFRAN) 8 MG tablet TAKE 1 TABLET BY MOUTH EVERY 8 HOURS AS NEEDED. 30 tablet 6   pantoprazole (PROTONIX) 40 MG tablet TAKE (1) TABLET BY MOUTH ONCE DAILY. 90 tablet 0   POMALYST 2 MG capsule TAKE 1 CAPSULE BY MOUTH ONCE DAILY FOR 21 DAYS ON AND 7 DAYS OFF 21 capsule 0   pregabalin (LYRICA) 200 MG capsule TAKE (1) CAPSULE BY MOUTH TWICE DAILY. 60 capsule 11   prochlorperazine (COMPAZINE) 10 MG tablet TAKE 1 TABLET BY MOUTH EVERY 6 HOURS AS NEEDED FOR NAUSEA OR VOMITING. 30 tablet 6   Propylene Glycol (SYSTANE BALANCE) 0.6 % SOLN Apply 1 drop to eye daily as needed (dry eye).      rosuvastatin (CRESTOR) 5 MG tablet Take 5 mg by mouth daily.     valACYclovir (VALTREX) 1000 MG tablet TAKE 2 TABLETS BY MOUTH NOW; THEN 2 12 HOURS LATER. 12 tablet 0   VELCADE 3.5 MG injection RECONSTITUTE EACH VIAL AS DIRECTED.  INJECT 2.5 MG UNDER THE SKIN EVERY 14 DAYS. 2 each 2   oxybutynin (DITROPAN XL) 10 MG 24 hr tablet Take 1 tablet (10 mg total) by mouth at bedtime. 30 tablet 5   TRULICITY 1.00 FH/2.1FX SOPN SMARTSIG:0.5 Milliliter(s) SUB-Q Once a Week     metFORMIN (GLUCOPHAGE) 500 MG tablet Take 500 mg by mouth 2 (two) times daily.     No facility-administered medications prior to visit.    No Known Allergies  ROS Review of Systems  Constitutional:  Positive for fatigue. Negative for chills  and fever.  HENT:  Negative for congestion, sinus pressure, sinus pain and sore throat.   Eyes:  Negative for pain and discharge.  Respiratory:  Positive for shortness of breath. Negative for cough.   Cardiovascular:  Negative for chest pain and palpitations.  Gastrointestinal:  Negative for abdominal pain, diarrhea, nausea and vomiting.  Endocrine: Negative for polydipsia and polyuria.  Genitourinary:  Positive for frequency. Negative for dysuria and hematuria.  Musculoskeletal:  Positive for arthralgias and back pain. Negative for neck pain and neck stiffness.  Skin:  Negative for rash.  Neurological:  Positive for numbness. Negative for dizziness and weakness.  Psychiatric/Behavioral:  Negative for agitation and behavioral problems. The patient is nervous/anxious.       Objective:    Physical Exam Vitals reviewed.  Constitutional:      General: She is not in acute distress.    Appearance: She is obese. She is not diaphoretic.  HENT:     Head: Normocephalic and atraumatic.     Nose: Nose normal.     Mouth/Throat:     Mouth: Mucous membranes are moist.  Eyes:     General: No scleral icterus.    Extraocular Movements: Extraocular movements intact.  Cardiovascular:     Rate and Rhythm: Normal rate and regular rhythm.     Pulses: Normal pulses.     Heart sounds: Normal heart sounds. No murmur heard. Pulmonary:     Breath sounds: Normal breath sounds. No wheezing or rales.  Abdominal:     Palpations: Abdomen is soft.     Tenderness: There is no abdominal tenderness.  Musculoskeletal:        General: Tenderness (Lumbar spine area) present.     Cervical back: Neck supple. No tenderness.     Right lower leg: No edema.     Left lower leg: No edema.  Skin:    General: Skin is warm.     Findings: No rash.  Neurological:     General: No focal deficit present.     Mental Status: She is alert and oriented to person, place, and time. Mental status is at baseline.     Motor: No  weakness.     Gait: Gait normal.  Psychiatric:        Mood and Affect: Mood normal.        Behavior: Behavior normal.     BP 122/82 (BP Location: Left Arm, Patient Position: Sitting, Cuff Size: Normal)   Pulse 77   Resp 18   Ht 5' 4" (1.626 m)   Wt 266 lb 9.6 oz (120.9 kg)   SpO2 94%   BMI 45.76 kg/m  Wt Readings from Last 3 Encounters:  02/05/22 266 lb 9.6 oz (120.9 kg)  02/01/22 268 lb 8 oz (121.8 kg)  01/25/22 267 lb 6.7 oz (121.3 kg)    Lab Results  Component Value Date   TSH 5.380 (H) 01/30/2022   Lab Results  Component Value Date   WBC 5.2 02/01/2022   HGB 8.2 (L) 02/01/2022   HCT 26.9 (L) 02/01/2022   MCV 89.7 02/01/2022   PLT 175 02/01/2022   Lab Results  Component Value Date   NA 138 02/01/2022   K 4.0 02/01/2022   CO2 24 02/01/2022   GLUCOSE 160 (H) 02/01/2022   BUN 24 (H) 02/01/2022   CREATININE 1.11 (H) 02/01/2022   BILITOT 0.5 02/01/2022   ALKPHOS 89 02/01/2022   AST 16 02/01/2022   ALT 12 02/01/2022   PROT 6.4 (L) 02/01/2022   ALBUMIN 3.0 (L) 02/01/2022   CALCIUM 8.4 (L) 02/01/2022   ANIONGAP 7 02/01/2022   EGFR 68 01/30/2022   Lab Results  Component Value Date   CHOL 105 01/30/2022   Lab Results  Component Value Date   HDL 35 (L) 01/30/2022   Lab Results  Component Value Date   LDLCALC 43 01/30/2022   Lab Results  Component Value Date   TRIG 160 (H) 01/30/2022   Lab Results  Component Value Date   CHOLHDL 3.0 01/30/2022   Lab Results  Component Value Date   HGBA1C 6.6 (H) 01/30/2022      Assessment & Plan:   Problem List Items Addressed This Visit       Cardiovascular and Mediastinum   Essential hypertension, benign    BP Readings from Last 1 Encounters:  02/05/22 122/82  Well-controlled with Amlodipine, Losartan and Coreg Followed by Cardiology at [facility] for compliance with the medications Advised DASH diet and moderate exercise/walking as tolerated        Digestive   GERD  (gastroesophageal reflux disease)    Well-controlled On pantoprazole        Endocrine   Type 2 diabetes mellitus with other specified complication ([place])    Likely steroid-induced Lab Results  Component Value Date   HGBA1C 6.6 (H) 01/30/2022   Followed by endocrinology at [facility] On metformin and Trulicity On ISS now On statin and ARB Advised to follow diabetic diet Diabetic eye exam: Advised to follow up with Ophthalmology for diabetic eye exam      Relevant Medications   Dulaglutide 1.5 MG/0.5ML SOPN   Subclinical hypothyroidism    Likely due to recent stress - undergoing chemotherapy Recheck TSH and free T4 after 3 months      Relevant Orders   TSH + free T4     Other   Anemia    Followed by heme-onc Planned to get iron transfusions No signs of active bleeding currently      Encounter for general adult medical examination with abnormal findings - Primary    Physical exam as documented. Fasting blood tests reviewed.      Vitamin D deficiency    Last vitamin D Lab Results  Component Value Date   VD25OH 9.4 (L) 01/30/2022  Advised to take vitamin D 5000 IU daily.       No orders of the defined types were placed in this encounter.   Follow-up: Return in about 6 months (around 08/08/2022).    Lindell Spar, MD

## 2022-02-05 NOTE — Assessment & Plan Note (Signed)
Well-controlled On pantoprazole

## 2022-02-06 ENCOUNTER — Encounter: Payer: Self-pay | Admitting: Hematology

## 2022-02-06 ENCOUNTER — Other Ambulatory Visit: Payer: Self-pay

## 2022-02-06 ENCOUNTER — Encounter (HOSPITAL_COMMUNITY): Payer: Self-pay | Admitting: Hematology

## 2022-02-08 ENCOUNTER — Inpatient Hospital Stay: Payer: BC Managed Care – PPO | Attending: Hematology

## 2022-02-08 ENCOUNTER — Encounter: Payer: Self-pay | Admitting: Hematology

## 2022-02-08 ENCOUNTER — Inpatient Hospital Stay: Payer: BC Managed Care – PPO

## 2022-02-08 ENCOUNTER — Other Ambulatory Visit: Payer: Self-pay | Admitting: *Deleted

## 2022-02-08 ENCOUNTER — Encounter (HOSPITAL_COMMUNITY): Payer: Self-pay | Admitting: Hematology

## 2022-02-08 VITALS — BP 115/72 | HR 69 | Resp 20

## 2022-02-08 DIAGNOSIS — Z79899 Other long term (current) drug therapy: Secondary | ICD-10-CM | POA: Diagnosis not present

## 2022-02-08 DIAGNOSIS — Z5112 Encounter for antineoplastic immunotherapy: Secondary | ICD-10-CM | POA: Insufficient documentation

## 2022-02-08 DIAGNOSIS — C9000 Multiple myeloma not having achieved remission: Secondary | ICD-10-CM

## 2022-02-08 DIAGNOSIS — D649 Anemia, unspecified: Secondary | ICD-10-CM | POA: Diagnosis not present

## 2022-02-08 LAB — COMPREHENSIVE METABOLIC PANEL
ALT: 17 U/L (ref 0–44)
AST: 21 U/L (ref 15–41)
Albumin: 3 g/dL — ABNORMAL LOW (ref 3.5–5.0)
Alkaline Phosphatase: 99 U/L (ref 38–126)
Anion gap: 8 (ref 5–15)
BUN: 17 mg/dL (ref 6–20)
CO2: 24 mmol/L (ref 22–32)
Calcium: 8.6 mg/dL — ABNORMAL LOW (ref 8.9–10.3)
Chloride: 106 mmol/L (ref 98–111)
Creatinine, Ser: 0.97 mg/dL (ref 0.44–1.00)
GFR, Estimated: >60 mL/min (ref >60)
Glucose, Bld: 180 mg/dL — ABNORMAL HIGH (ref 70–99)
Potassium: 3.6 mmol/L (ref 3.5–5.1)
Sodium: 138 mmol/L (ref 135–145)
Total Bilirubin: 0.7 mg/dL (ref 0.3–1.2)
Total Protein: 6.6 g/dL (ref 6.5–8.1)

## 2022-02-08 LAB — CBC WITH DIFFERENTIAL/PLATELET
Abs Immature Granulocytes: 0.01 10*3/uL (ref 0.00–0.07)
Basophils Absolute: 0 10*3/uL (ref 0.0–0.1)
Basophils Relative: 0 %
Eosinophils Absolute: 0.1 10*3/uL (ref 0.0–0.5)
Eosinophils Relative: 2 %
HCT: 27.6 % — ABNORMAL LOW (ref 36.0–46.0)
Hemoglobin: 8.4 g/dL — ABNORMAL LOW (ref 12.0–15.0)
Immature Granulocytes: 0 %
Lymphocytes Relative: 24 %
Lymphs Abs: 0.9 10*3/uL (ref 0.7–4.0)
MCH: 27 pg (ref 26.0–34.0)
MCHC: 30.4 g/dL (ref 30.0–36.0)
MCV: 88.7 fL (ref 80.0–100.0)
Monocytes Absolute: 0.7 10*3/uL (ref 0.1–1.0)
Monocytes Relative: 19 %
Neutro Abs: 2.1 10*3/uL (ref 1.7–7.7)
Neutrophils Relative %: 55 %
Platelets: 111 10*3/uL — ABNORMAL LOW (ref 150–400)
RBC: 3.11 MIL/uL — ABNORMAL LOW (ref 3.87–5.11)
RDW: 17.3 % — ABNORMAL HIGH (ref 11.5–15.5)
WBC: 3.8 10*3/uL — ABNORMAL LOW (ref 4.0–10.5)
nRBC: 0 % (ref 0.0–0.2)

## 2022-02-08 LAB — MAGNESIUM: Magnesium: 1.7 mg/dL (ref 1.7–2.4)

## 2022-02-08 LAB — IRON AND TIBC
Iron: 36 ug/dL (ref 28–170)
Saturation Ratios: 11 % (ref 10.4–31.8)
TIBC: 321 ug/dL (ref 250–450)
UIBC: 285 ug/dL

## 2022-02-08 LAB — FOLATE: Folate: 7.7 ng/mL (ref >5.9)

## 2022-02-08 LAB — FERRITIN: Ferritin: 195 ng/mL (ref 11–307)

## 2022-02-08 LAB — VITAMIN B12: Vitamin B-12: 139 pg/mL — ABNORMAL LOW (ref 180–914)

## 2022-02-08 MED ORDER — DARATUMUMAB-HYALURONIDASE-FIHJ 1800-30000 MG-UT/15ML ~~LOC~~ SOLN
1800.0000 mg | Freq: Once | SUBCUTANEOUS | Status: AC
  Administered 2022-02-08: 1800 mg via SUBCUTANEOUS
  Filled 2022-02-08: qty 15

## 2022-02-08 MED ORDER — PROMETHAZINE HCL 25 MG PO TABS: 25.0000 mg | ORAL_TABLET | Freq: Four times a day (QID) | ORAL | 3 refills | Status: DC | PRN

## 2022-02-08 MED ORDER — SODIUM CHLORIDE 0.9 % IV SOLN: Freq: Once | INTRAVENOUS | Status: AC

## 2022-02-08 MED ORDER — SODIUM CHLORIDE 0.9 % IV SOLN
20.0000 mg | Freq: Once | INTRAVENOUS | Status: AC
  Administered 2022-02-08: 20 mg via INTRAVENOUS
  Filled 2022-02-08: qty 2

## 2022-02-08 MED ORDER — HEPARIN SOD (PORK) LOCK FLUSH 100 UNIT/ML IV SOLN
500.0000 [IU] | Freq: Once | INTRAVENOUS | Status: AC | PRN
  Administered 2022-02-08: 500 [IU]

## 2022-02-08 MED ORDER — MONTELUKAST SODIUM 10 MG PO TABS
10.0000 mg | ORAL_TABLET | Freq: Once | ORAL | Status: AC
  Administered 2022-02-08: 10 mg via ORAL
  Filled 2022-02-08: qty 1

## 2022-02-08 MED ORDER — DEXTROSE 5 % IV SOLN
70.0000 mg/m2 | Freq: Once | INTRAVENOUS | Status: AC
  Administered 2022-02-08: 150 mg via INTRAVENOUS
  Filled 2022-02-08: qty 60

## 2022-02-08 MED ORDER — ACETAMINOPHEN 325 MG PO TABS
650.0000 mg | ORAL_TABLET | Freq: Once | ORAL | Status: AC
  Administered 2022-02-08: 650 mg via ORAL
  Filled 2022-02-08: qty 2

## 2022-02-08 MED ORDER — SODIUM CHLORIDE 0.9% FLUSH
10.0000 mL | INTRAVENOUS | Status: DC | PRN
  Administered 2022-02-08: 10 mL

## 2022-02-08 MED ORDER — DIPHENHYDRAMINE HCL 25 MG PO CAPS
50.0000 mg | ORAL_CAPSULE | Freq: Once | ORAL | Status: AC
  Administered 2022-02-08: 50 mg via ORAL
  Filled 2022-02-08: qty 2

## 2022-02-08 NOTE — Progress Notes (Signed)
Patient presents today for Kyprolis and Daratumumab per providers order.  Vital signs within parameters for treatment.  Labs pending.  Patient states that she had nausea and vomiting for a couple of days after her last treatment.

## 2022-02-08 NOTE — Progress Notes (Signed)
Patients port flushed without difficulty.  Good blood return noted with no bruising or swelling noted at site.  Stable during access and blood draw.  Patient to remain accessed for treatment. 

## 2022-02-08 NOTE — Progress Notes (Signed)
Treatment given per orders. Patient tolerated it well without problems. Vitals stable and discharged home from clinic via wheelchair Follow up as scheduled.  

## 2022-02-08 NOTE — Patient Instructions (Signed)
[place]  Discharge Instructions: Thank you for choosing [facility] to provide your oncology and hematology care.  If you have a lab appointment with the [facility], please come in thru the Main Entrance and check in at the main information desk.  Wear comfortable clothing and clothing appropriate for easy access to any Portacath or PICC line.   We strive to give you quality time with your provider. You may need to reschedule your appointment if you arrive late (15 or more minutes).  Arriving late affects you and other patients whose appointments are after yours.  Also, if you miss three or more appointments without notifying the office, you may be dismissed from the clinic at the provider's discretion.      For prescription refill requests, have your pharmacy contact our office and allow 72 hours for refills to be completed.    Today you received the following chemotherapy and/or immunotherapy agents daratumumab, kyprolis      To help prevent nausea and vomiting after your treatment, we encourage you to take your nausea medication as directed.  BELOW ARE SYMPTOMS THAT SHOULD BE REPORTED IMMEDIATELY: *FEVER GREATER THAN 100.4 F (38 C) OR HIGHER *CHILLS OR SWEATING *NAUSEA AND VOMITING THAT IS NOT CONTROLLED WITH YOUR NAUSEA MEDICATION *UNUSUAL SHORTNESS OF BREATH *UNUSUAL BRUISING OR BLEEDING *URINARY PROBLEMS (pain or burning when urinating, or frequent urination) *BOWEL PROBLEMS (unusual diarrhea, constipation, pain near the anus) TENDERNESS IN MOUTH AND THROAT WITH OR WITHOUT PRESENCE OF ULCERS (sore throat, sores in mouth, or a toothache) UNUSUAL RASH, SWELLING OR PAIN  UNUSUAL VAGINAL DISCHARGE OR ITCHING   Items with * indicate a potential emergency and should be followed up as soon as possible or go to the Emergency Department if any problems should occur.  Please show the CHEMOTHERAPY ALERT CARD or IMMUNOTHERAPY ALERT CARD at check-in to  the Emergency Department and triage nurse.  Should you have questions after your visit or need to cancel or reschedule your appointment, please contact [facility] [phone]  and follow the prompts.  Office hours are 8:00 a.m. to 4:30 p.m. Monday - Friday. Please note that voicemails left after 4:00 p.m. may not be returned until the following business day.  We are closed weekends and major holidays. You have access to a nurse at all times for urgent questions. Please call the main number to the clinic [phone] and follow the prompts.  For any non-urgent questions, you may also contact your provider using MyChart. We now offer e-Visits for anyone 11 and older to request care online for non-urgent symptoms. For details visit [email or web address].   Also download the MyChart app! Go to the app store, search "MyChart", open the app, select [facility], and log in with your MyChart username and password.  Masks are optional in the cancer centers. If you would like for your care team to wear a mask while they are taking care of you, please let them know. For doctor visits, patients may have with them one support person who is at least 57 years old. At this time, visitors are not allowed in the infusion area.

## 2022-02-14 ENCOUNTER — Inpatient Hospital Stay (HOSPITAL_COMMUNITY): Payer: BC Managed Care – PPO

## 2022-02-14 NOTE — Progress Notes (Signed)
Zometa plan updated to 3 mg every 28 days per MD notes.  Next dose will be February 15, 2022.  Henreitta Leber, PharmD

## 2022-02-15 ENCOUNTER — Inpatient Hospital Stay: Payer: BC Managed Care – PPO

## 2022-02-15 VITALS — BP 139/90 | HR 69 | Temp 97.9°F | Resp 20

## 2022-02-15 DIAGNOSIS — C9000 Multiple myeloma not having achieved remission: Secondary | ICD-10-CM

## 2022-02-15 DIAGNOSIS — Z5112 Encounter for antineoplastic immunotherapy: Secondary | ICD-10-CM | POA: Diagnosis not present

## 2022-02-15 LAB — LACTATE DEHYDROGENASE: LDH: 169 U/L (ref 98–192)

## 2022-02-15 LAB — COMPREHENSIVE METABOLIC PANEL
ALT: 14 U/L (ref 0–44)
AST: 17 U/L (ref 15–41)
Albumin: 3 g/dL — ABNORMAL LOW (ref 3.5–5.0)
Alkaline Phosphatase: 103 U/L (ref 38–126)
Anion gap: 9 (ref 5–15)
BUN: 15 mg/dL (ref 6–20)
CO2: 23 mmol/L (ref 22–32)
Calcium: 8.8 mg/dL — ABNORMAL LOW (ref 8.9–10.3)
Chloride: 105 mmol/L (ref 98–111)
Creatinine, Ser: 0.88 mg/dL (ref 0.44–1.00)
GFR, Estimated: >60 mL/min (ref >60)
Glucose, Bld: 133 mg/dL — ABNORMAL HIGH (ref 70–99)
Potassium: 3.8 mmol/L (ref 3.5–5.1)
Sodium: 137 mmol/L (ref 135–145)
Total Bilirubin: 0.5 mg/dL (ref 0.3–1.2)
Total Protein: 6.4 g/dL — ABNORMAL LOW (ref 6.5–8.1)

## 2022-02-15 LAB — CBC WITH DIFFERENTIAL/PLATELET
Abs Immature Granulocytes: 0.01 10*3/uL (ref 0.00–0.07)
Basophils Absolute: 0 10*3/uL (ref 0.0–0.1)
Basophils Relative: 0 %
Eosinophils Absolute: 0 10*3/uL (ref 0.0–0.5)
Eosinophils Relative: 1 %
HCT: 26 % — ABNORMAL LOW (ref 36.0–46.0)
Hemoglobin: 8.1 g/dL — ABNORMAL LOW (ref 12.0–15.0)
Immature Granulocytes: 0 %
Lymphocytes Relative: 26 %
Lymphs Abs: 0.9 10*3/uL (ref 0.7–4.0)
MCH: 27.5 pg (ref 26.0–34.0)
MCHC: 31.2 g/dL (ref 30.0–36.0)
MCV: 88.1 fL (ref 80.0–100.0)
Monocytes Absolute: 0.6 10*3/uL (ref 0.1–1.0)
Monocytes Relative: 19 %
Neutro Abs: 1.8 10*3/uL (ref 1.7–7.7)
Neutrophils Relative %: 54 %
Platelets: 82 10*3/uL — ABNORMAL LOW (ref 150–400)
RBC: 2.95 MIL/uL — ABNORMAL LOW (ref 3.87–5.11)
RDW: 17.6 % — ABNORMAL HIGH (ref 11.5–15.5)
WBC: 3.4 10*3/uL — ABNORMAL LOW (ref 4.0–10.5)
nRBC: 0.6 % — ABNORMAL HIGH (ref 0.0–0.2)

## 2022-02-15 LAB — MAGNESIUM: Magnesium: 1.9 mg/dL (ref 1.7–2.4)

## 2022-02-15 MED ORDER — SODIUM CHLORIDE 0.9% FLUSH
10.0000 mL | INTRAVENOUS | Status: DC | PRN
  Administered 2022-02-15: 10 mL

## 2022-02-15 MED ORDER — HEPARIN SOD (PORK) LOCK FLUSH 100 UNIT/ML IV SOLN
500.0000 [IU] | Freq: Once | INTRAVENOUS | Status: AC
  Administered 2022-02-15: 500 [IU] via INTRAVENOUS

## 2022-02-15 MED ORDER — SODIUM CHLORIDE 0.9 % IV SOLN: Freq: Once | INTRAVENOUS | Status: AC

## 2022-02-15 MED ORDER — ZOLEDRONIC ACID 4 MG/5ML IV CONC
3.0000 mg | Freq: Once | INTRAVENOUS | Status: AC
  Administered 2022-02-15: 3 mg via INTRAVENOUS
  Filled 2022-02-15: qty 3.75

## 2022-02-15 NOTE — Progress Notes (Signed)
Pt presents today for Dara SQ and Zometa IV per provider's order. Pt's platelets are 82 today. Dr.K made aware.  Pt c/o daily fevers, nausea, no appetite, and fatigued since starting IV chemotherapy. [place] sent to Dr.K and stated to hold treatment today. Dr.K also told pt to go to the ER if temperature reaches 100.5. Pt verbalized understanding.  Zometa IV given today per MD orders. Tolerated infusion without adverse affects. Vital signs stable. No complaints at this time. Discharged from clinic via wheelchair  in stable condition. Alert and oriented x 3. F/U with [facility] as scheduled.

## 2022-02-15 NOTE — Patient Instructions (Signed)
[facility]  Discharge Instructions: Thank you for choosing [facility] to provide your oncology and hematology care.  If you have a lab appointment with the [facility], please come in thru the Main Entrance and check in at the main information desk.  Wear comfortable clothing and clothing appropriate for easy access to any Portacath or PICC line.   We strive to give you quality time with your provider. You may need to reschedule your appointment if you arrive late (15 or more minutes).  Arriving late affects you and other patients whose appointments are after yours.  Also, if you miss three or more appointments without notifying the office, you may be dismissed from the clinic at the provider's discretion.      For prescription refill requests, have your pharmacy contact our office and allow 72 hours for refills to be completed.    Today you received Zometa      BELOW ARE SYMPTOMS THAT SHOULD BE REPORTED IMMEDIATELY: *FEVER GREATER THAN 100.4 F (38 C) OR HIGHER *CHILLS OR SWEATING *NAUSEA AND VOMITING THAT IS NOT CONTROLLED WITH YOUR NAUSEA MEDICATION *UNUSUAL SHORTNESS OF BREATH *UNUSUAL BRUISING OR BLEEDING *URINARY PROBLEMS (pain or burning when urinating, or frequent urination) *BOWEL PROBLEMS (unusual diarrhea, constipation, pain near the anus) TENDERNESS IN MOUTH AND THROAT WITH OR WITHOUT PRESENCE OF ULCERS (sore throat, sores in mouth, or a toothache) UNUSUAL RASH, SWELLING OR PAIN  UNUSUAL VAGINAL DISCHARGE OR ITCHING   Items with * indicate a potential emergency and should be followed up as soon as possible or go to the Emergency Department if any problems should occur.  Please show the CHEMOTHERAPY ALERT CARD or IMMUNOTHERAPY ALERT CARD at check-in to the Emergency Department and triage nurse.  Should you have questions after your visit or need to cancel or reschedule your appointment, please contact [facility]  [phone]  and follow the prompts.  Office hours are 8:00 a.m. to 4:30 p.m. Monday - Friday. Please note that voicemails left after 4:00 p.m. may not be returned until the following business day.  We are closed weekends and major holidays. You have access to a nurse at all times for urgent questions. Please call the main number to the clinic [phone] and follow the prompts.  For any non-urgent questions, you may also contact your provider using MyChart. We now offer e-Visits for anyone 51 and older to request care online for non-urgent symptoms. For details visit [email or web address].   Also download the MyChart app! Go to the app store, search "MyChart", open the app, select [facility], and log in with your MyChart username and password.  Masks are optional in the cancer centers. If you would like for your care team to wear a mask while they are taking care of you, please let them know. For doctor visits, patients may have with them one support person who is at least 57 years old. At this time, visitors are not allowed in the infusion area.

## 2022-02-16 LAB — KAPPA/LAMBDA LIGHT CHAINS
Kappa free light chain: 6.4 mg/L (ref 3.3–19.4)
Kappa, lambda light chain ratio: 0.01 — ABNORMAL LOW (ref 0.26–1.65)
Lambda free light chains: 732.9 mg/L — ABNORMAL HIGH (ref 5.7–26.3)

## 2022-02-19 ENCOUNTER — Telehealth: Payer: Self-pay | Admitting: *Deleted

## 2022-02-19 ENCOUNTER — Encounter (HOSPITAL_COMMUNITY): Payer: Self-pay | Admitting: Emergency Medicine

## 2022-02-19 ENCOUNTER — Emergency Department (HOSPITAL_COMMUNITY)
Admission: EM | Admit: 2022-02-19 | Discharge: 2022-02-19 | Disposition: A | Discharge status: Home / Self Care | Payer: BC Managed Care – PPO | Attending: Emergency Medicine | Admitting: Emergency Medicine

## 2022-02-19 ENCOUNTER — Other Ambulatory Visit: Payer: Self-pay

## 2022-02-19 ENCOUNTER — Emergency Department (HOSPITAL_COMMUNITY): Payer: BC Managed Care – PPO

## 2022-02-19 DIAGNOSIS — U071 COVID-19: Secondary | ICD-10-CM

## 2022-02-19 DIAGNOSIS — Z7982 Long term (current) use of aspirin: Secondary | ICD-10-CM | POA: Diagnosis not present

## 2022-02-19 DIAGNOSIS — R059 Cough, unspecified: Secondary | ICD-10-CM | POA: Diagnosis present

## 2022-02-19 DIAGNOSIS — Z794 Long term (current) use of insulin: Secondary | ICD-10-CM | POA: Diagnosis not present

## 2022-02-19 DIAGNOSIS — Z79899 Other long term (current) drug therapy: Secondary | ICD-10-CM | POA: Diagnosis not present

## 2022-02-19 LAB — CBC WITH DIFFERENTIAL/PLATELET
Abs Immature Granulocytes: 0.02 10*3/uL (ref 0.00–0.07)
Basophils Absolute: 0 10*3/uL (ref 0.0–0.1)
Basophils Relative: 0 %
Eosinophils Absolute: 0 10*3/uL (ref 0.0–0.5)
Eosinophils Relative: 1 %
HCT: 27.5 % — ABNORMAL LOW (ref 36.0–46.0)
Hemoglobin: 8.3 g/dL — ABNORMAL LOW (ref 12.0–15.0)
Immature Granulocytes: 1 %
Lymphocytes Relative: 23 %
Lymphs Abs: 0.8 10*3/uL (ref 0.7–4.0)
MCH: 27.1 pg (ref 26.0–34.0)
MCHC: 30.2 g/dL (ref 30.0–36.0)
MCV: 89.9 fL (ref 80.0–100.0)
Monocytes Absolute: 0.6 10*3/uL (ref 0.1–1.0)
Monocytes Relative: 17 %
Neutro Abs: 2 10*3/uL (ref 1.7–7.7)
Neutrophils Relative %: 58 %
Platelets: 132 10*3/uL — ABNORMAL LOW (ref 150–400)
RBC: 3.06 MIL/uL — ABNORMAL LOW (ref 3.87–5.11)
RDW: 18.4 % — ABNORMAL HIGH (ref 11.5–15.5)
WBC: 3.5 10*3/uL — ABNORMAL LOW (ref 4.0–10.5)
nRBC: 0.9 % — ABNORMAL HIGH (ref 0.0–0.2)

## 2022-02-19 LAB — IMMUNOFIXATION ELECTROPHORESIS
IgA: 23 mg/dL — ABNORMAL LOW (ref 87–352)
IgG (Immunoglobin G), Serum: 807 mg/dL (ref 586–1602)
IgM (Immunoglobulin M), Srm: 17 mg/dL — ABNORMAL LOW (ref 26–217)
Total Protein ELP: 5.7 g/dL — ABNORMAL LOW (ref 6.0–8.5)

## 2022-02-19 LAB — BASIC METABOLIC PANEL
Anion gap: 8 (ref 5–15)
BUN: 16 mg/dL (ref 6–20)
CO2: 23 mmol/L (ref 22–32)
Calcium: 8.4 mg/dL — ABNORMAL LOW (ref 8.9–10.3)
Chloride: 106 mmol/L (ref 98–111)
Creatinine, Ser: 0.99 mg/dL (ref 0.44–1.00)
GFR, Estimated: >60 mL/min (ref >60)
Glucose, Bld: 187 mg/dL — ABNORMAL HIGH (ref 70–99)
Potassium: 3.5 mmol/L (ref 3.5–5.1)
Sodium: 137 mmol/L (ref 135–145)

## 2022-02-19 LAB — PROTEIN ELECTROPHORESIS, SERUM
A/G Ratio: 1 (ref 0.7–1.7)
Albumin ELP: 2.8 g/dL — ABNORMAL LOW (ref 2.9–4.4)
Alpha-1-Globulin: 0.3 g/dL (ref 0.0–0.4)
Alpha-2-Globulin: 0.9 g/dL (ref 0.4–1.0)
Beta Globulin: 0.7 g/dL (ref 0.7–1.3)
Gamma Globulin: 0.8 g/dL (ref 0.4–1.8)
Globulin, Total: 2.8 g/dL (ref 2.2–3.9)
M-Spike, %: 0.2 g/dL — ABNORMAL HIGH
Total Protein ELP: 5.6 g/dL — ABNORMAL LOW (ref 6.0–8.5)

## 2022-02-19 LAB — SARS CORONAVIRUS 2 BY RT PCR: SARS Coronavirus 2 by RT PCR: POSITIVE — AB

## 2022-02-19 MED ORDER — PAXLOVID (300/100) 20 X 150 MG & 10 X 100MG PO TBPK: 1.0000 | ORAL_TABLET | Freq: Two times a day (BID) | ORAL | 0 refills | Status: AC

## 2022-02-19 NOTE — Discharge Instructions (Addendum)
You were seen in the emergency department for lab work after a positive COVID-19 test.  We reconfirmed the positive COVID diagnosis.  Please take the prescription as provided by your oncologist for antiviral therapy.  I recommend following up with them for further evaluation and management.  Return to the emergency department if you develop any life-threatening conditions

## 2022-02-19 NOTE — ED Triage Notes (Signed)
Pt presents Covid + and currently receiving chemo told to come in for labs and chest x-ray by PCP.

## 2022-02-19 NOTE — Telephone Encounter (Signed)
Daughter Larene Beach called to advise that patient tested for COVID this morning.  Symptoms started 8/13, as husband was positive as well.  Currently not feeling well in general and has a productive cough, along with fever of 101.5.    Advised to go to the ER to rule out pneumonia or other infections due to chemo treatments.  Will cancel chemo for this week and resume on 8/24.  Advised daughter of the previous information and that she and her spouse, if he comes with her will need to wear a mask for 21 days from today.  We will be calling in Paxlovid 300 mg bid x 5 days and patient knows to advise ER provider and Larene Beach will pick up so she can start after she is evaluated in the ER.

## 2022-02-19 NOTE — ED Provider Notes (Signed)
[facility] EMERGENCY DEPARTMENT Provider Note   CSN: [id number] Arrival date & time: 02/19/22  1039     History  Chief Complaint  Patient presents with   Covid Positive    Joanna Reid is a 56 y.o. female.  The patient presents to the emergency department at the recommendation of oncology due to a positive COVID test.  Patient is being treated for multiple myeloma.  Her husband had COVID and she now has tested positive.  She states she has been feeling miserable with body aches, fever, chills, and cough since yesterday.  Oncology wanted her seen in the emergency department for labs and chest x-ray.  Patient denies shortness of breath, chest pain, abdominal pain.  Past medical history significant for multiple myeloma, diabetes mellitus  HPI     Home Medications Prior to Admission medications   Medication Sig Start Date End Date Taking? Authorizing Provider  acyclovir (ZOVIRAX) 400 MG tablet TAKE (1) TABLET BY MOUTH TWICE DAILY. 01/23/22   Harriett Rush, PA-C  albuterol (VENTOLIN HFA) 108 (90 Base) MCG/ACT inhaler Inhale 2 puffs into the lungs every 4 (four) hours as needed for wheezing or shortness of breath. 11/30/20   Lovena Le, Malena M, DO  allopurinol (ZYLOPRIM) 300 MG tablet TAKE (1) TABLET BY MOUTH ONCE DAILY. 01/23/22   Lindell Spar, MD  amLODipine (NORVASC) 2.5 MG tablet Take 2.5 mg by mouth daily. 01/27/21   [provider]  aspirin EC 81 MG tablet Take 1 tablet (81 mg total) by mouth daily. 12/26/19   Rehman, Mechele Dawley, MD  BD PEN NEEDLE NANO U/F 32G X 4 MM MISC Inject into the skin. 01/31/22   [provider]  bumetanide (BUMEX) 0.5 MG tablet Take 0.5 mg by mouth daily.  06/30/19   [provider]  carvedilol (COREG) 25 MG tablet Take 1 tablet (25 mg total) by mouth 2 (two) times daily. 12/29/19   Thurnell Lose, MD  DARZALEX FASPRO 1800-30000 MG-UT/15ML SOLN Inject into the skin. 01/29/22   [provider]  diazepam (VALIUM) 5 MG  tablet TAKE (1) TABLET BY MOUTH AT BEDTIME. 11/21/21   Lindell Spar, MD  diclofenac Sodium (VOLTAREN) 1 % GEL Apply 2 g topically 4 (four) times daily. 05/01/21   Caccavale, Sophia, PA-C  dicyclomine (BENTYL) 20 MG tablet Take 1 tablet (20 mg total) by mouth 2 (two) times daily as needed for spasms. 05/01/21   Caccavale, Sophia, PA-C  Docosanol 10 % CREA Apply 1 application topically daily as needed (for nose).     [provider]  docusate sodium (COLACE) 100 MG capsule Take 100 mg by mouth daily.     [provider]  Dulaglutide 1.5 MG/0.5ML SOPN Inject 1.5 mg into the skin every 7 (seven) days.    [provider]  erythromycin ophthalmic ointment Place 1 application into the left eye at bedtime. 08/07/21   Lindell Spar, MD  escitalopram (LEXAPRO) 10 MG tablet TAKE (1) TABLET BY MOUTH ONCE DAILY. MAY START WITH 1/2 TABLET FOR 7 DAYS. 01/23/22   Lindell Spar, MD  fluticasone Asencion Islam) 50 MCG/ACT nasal spray INSTILL 2 SPRAYS INTO BOTH NOSTRILS DAILY 06/28/21   Lindell Spar, MD  Insulin Lispro w/ Trans Port 100 UNIT/ML SOPN Inject before meals per sliding scale. Max TDD 20 units 01/31/22   [provider]  KYPROLIS 30 MG SOLR Inject into the vein. 01/29/22   [provider]  KYPROLIS 60 MG SOLR INFUSE 40MG INTRAVENOUSLY  FOR 1 DOSE 02/06/22   Derek Jack, MD  Lactulose 20 GM/30ML SOLN Take 30 mLs (20 g total) by mouth daily. 09/27/21   Derek Jack, MD  lidocaine-prilocaine (EMLA) cream Apply 1 Application topically as needed (Apply to port prior to treatment and flushes). 01/24/22   Derek Jack, MD  losartan (COZAAR) 100 MG tablet Take 100 mg by mouth daily.    [provider]  magnesium oxide (MAG-OX) 400 (241.3 Mg) MG tablet Take 1 tablet (400 mg total) by mouth 3 (three) times daily. 10/02/18   Derek Jack, MD  metFORMIN (GLUCOPHAGE) 500 MG tablet Take 500 mg by mouth 2 (two) times daily. 11/16/21 01/30/22   [provider]  nirmatrelvir & ritonavir (PAXLOVID, 300/100,) 20 x 150 MG & 10 x 100MG TBPK Take 1 tablet by mouth in the morning and at bedtime for 5 days. 02/19/22 02/24/22  Derek Jack, MD  nitrofurantoin, macrocrystal-monohydrate, (MACROBID) 100 MG capsule TAKE (1) CAPSULE BY MOUTH AT BEDTIME. 08/30/21   Derek Jack, MD  NOVOLOG FLEXPEN 100 UNIT/ML FlexPen Inject into the skin. 01/31/22   [provider]  ondansetron (ZOFRAN) 8 MG tablet TAKE 1 TABLET BY MOUTH EVERY 8 HOURS AS NEEDED. 08/03/20   Derek Jack, MD  pantoprazole (PROTONIX) 40 MG tablet TAKE (1) TABLET BY MOUTH ONCE DAILY. 01/17/22   Patel, Colin Broach, MD  POMALYST 2 MG capsule TAKE 1 CAPSULE BY MOUTH ONCE DAILY FOR 21 DAYS ON AND 7 DAYS OFF 01/22/22   Derek Jack, MD  pregabalin (LYRICA) 200 MG capsule TAKE (1) CAPSULE BY MOUTH TWICE DAILY. 01/25/22   Derek Jack, MD  promethazine (PHENERGAN) 25 MG tablet Take 1 tablet (25 mg total) by mouth every 6 (six) hours as needed for nausea or vomiting. 02/08/22   Derek Jack, MD  Propylene Glycol (SYSTANE BALANCE) 0.6 % SOLN Apply 1 drop to eye daily as needed (dry eye).     [provider]  rosuvastatin (CRESTOR) 5 MG tablet Take 5 mg by mouth daily. 10/17/20   [provider]  valACYclovir (VALTREX) 1000 MG tablet TAKE 2 TABLETS BY MOUTH NOW; THEN 2 12 HOURS LATER. 12/19/21   Derek Jack, MD  VELCADE 3.5 MG injection RECONSTITUTE EACH VIAL AS DIRECTED. INJECT 2.5 MG UNDER THE SKIN EVERY 14 DAYS. 01/22/22   Derek Jack, MD      Allergies    Patient has no known allergies.    Review of Systems   Review of Systems  Constitutional:  Positive for fever.  Respiratory:  Positive for cough. Negative for shortness of breath.   Cardiovascular:  Negative for chest pain.  Gastrointestinal:  Positive for nausea. Negative for abdominal pain.  Musculoskeletal:  Positive for myalgias.  Neurological:   Positive for headaches.    Physical Exam Updated Vital Signs BP 121/85 (BP Location: Left Arm)   Pulse 88   Temp 99.1 F (37.3 C) (Oral)   Resp 16   Ht _0  (1.626 m)   Wt 117.5 kg   SpO2 98%   BMI 44.46 kg/m  Physical Exam Vitals and nursing note reviewed.  Constitutional:      General: She is not in acute distress. HENT:     Head: Normocephalic.  Eyes:     Conjunctiva/sclera: Conjunctivae normal.  Cardiovascular:     Rate and Rhythm: Normal rate.  Pulmonary:     Effort: Pulmonary effort is normal.  Musculoskeletal:        General: Normal range of motion.  Cervical back: Normal range of motion.  Skin:    General: Skin is warm and dry.     Capillary Refill: Capillary refill takes less than 2 seconds.  Neurological:     Mental Status: She is alert and oriented to person, place, and time.     ED Results / Procedures / Treatments   Labs (all labs ordered are listed, but only abnormal results are displayed) Labs Reviewed  SARS CORONAVIRUS 2 BY RT PCR - Abnormal; Notable for the following components:      Result Value   SARS Coronavirus 2 by RT PCR POSITIVE (*)    All other components within normal limits  CBC WITH DIFFERENTIAL/PLATELET - Abnormal; Notable for the following components:   WBC 3.5 (*)    RBC 3.06 (*)    Hemoglobin 8.3 (*)    HCT 27.5 (*)    RDW 18.4 (*)    Platelets 132 (*)    nRBC 0.9 (*)    All other components within normal limits  BASIC METABOLIC PANEL - Abnormal; Notable for the following components:   Glucose, Bld 187 (*)    Calcium 8.4 (*)    All other components within normal limits    EKG None  Radiology DG Chest Portable 1 View  Result Date: 02/19/2022 CLINICAL DATA:  Chest pain EXAM: PORTABLE CHEST 1 VIEW COMPARISON:  Radiograph 01/22/2022 FINDINGS: Chest port catheter tip overlies the distal superior vena cava. Unchanged cardiomediastinal silhouette. There is no focal airspace consolidation. There is no pleural effusion. No  pneumothorax. There is no acute osseous abnormality. IMPRESSION: No evidence of acute cardiopulmonary disease. Electronically Signed   By: Maurine Simmering M.D.   On: 02/19/2022 11:36    Procedures Procedures    Medications Ordered in ED Medications - No data to display  ED Course/ Medical Decision Making/ A&P                           Medical Decision Making Amount and/or Complexity of Data Reviewed Labs: ordered. Radiology: ordered.   Patient's labs were evaluated at the request of oncology.  Patient has positive coronavirus test, CBC appears consistent with patient's previous values, BMP also consistent with previous testing.  I reviewed the patient's past medical history and see visits for multiple myeloma.  I also reviewed previous lab values for comparison to today's values.  I ordered and interpreted imaging including a chest x-ray.  No evidence of acute cardiopulmonary disease.  I agree with the radiologist findings.  Oncology called the triage desk when the patient was in the department and states they were planning to send in a prescription for Paxlovid.  The patient states she had no other concerns at this time.  I see no indication for admission.  The patient has normal vitals.  She may discharge home and take her antivirals as recommended by oncology.  Recommend following up with them for further management       Final Clinical Impression(s) / ED Diagnoses Final diagnoses:  COVID-19    Rx / DC Orders ED Discharge Orders     None         Ronny Bacon 02/19/22 1324    Milton Ferguson, MD 02/21/22 1225

## 2022-02-22 ENCOUNTER — Ambulatory Visit (HOSPITAL_COMMUNITY): Payer: BC Managed Care – PPO

## 2022-02-22 ENCOUNTER — Ambulatory Visit: Payer: BC Managed Care – PPO | Admitting: Hematology

## 2022-02-22 ENCOUNTER — Inpatient Hospital Stay: Payer: BC Managed Care – PPO

## 2022-02-22 ENCOUNTER — Other Ambulatory Visit (HOSPITAL_COMMUNITY): Payer: Self-pay | Admitting: Physician Assistant

## 2022-02-22 ENCOUNTER — Other Ambulatory Visit (HOSPITAL_COMMUNITY): Payer: BC Managed Care – PPO

## 2022-02-22 DIAGNOSIS — C9000 Multiple myeloma not having achieved remission: Secondary | ICD-10-CM

## 2022-03-01 ENCOUNTER — Ambulatory Visit (HOSPITAL_COMMUNITY): Payer: BC Managed Care – PPO | Admitting: Hematology

## 2022-03-01 ENCOUNTER — Encounter: Payer: Self-pay | Admitting: Hematology

## 2022-03-01 ENCOUNTER — Inpatient Hospital Stay: Payer: BC Managed Care – PPO

## 2022-03-01 ENCOUNTER — Ambulatory Visit: Payer: BC Managed Care – PPO

## 2022-03-01 ENCOUNTER — Inpatient Hospital Stay (HOSPITAL_BASED_OUTPATIENT_CLINIC_OR_DEPARTMENT_OTHER): Payer: BC Managed Care – PPO | Admitting: Hematology

## 2022-03-01 ENCOUNTER — Other Ambulatory Visit: Payer: BC Managed Care – PPO

## 2022-03-01 DIAGNOSIS — C9000 Multiple myeloma not having achieved remission: Secondary | ICD-10-CM

## 2022-03-01 DIAGNOSIS — Z5112 Encounter for antineoplastic immunotherapy: Secondary | ICD-10-CM | POA: Diagnosis not present

## 2022-03-01 LAB — COMPREHENSIVE METABOLIC PANEL
ALT: 15 U/L (ref 0–44)
AST: 23 U/L (ref 15–41)
Albumin: 2.6 g/dL — ABNORMAL LOW (ref 3.5–5.0)
Alkaline Phosphatase: 135 U/L — ABNORMAL HIGH (ref 38–126)
Anion gap: 10 (ref 5–15)
BUN: 22 mg/dL — ABNORMAL HIGH (ref 6–20)
CO2: 23 mmol/L (ref 22–32)
Calcium: 8.5 mg/dL — ABNORMAL LOW (ref 8.9–10.3)
Chloride: 102 mmol/L (ref 98–111)
Creatinine, Ser: 1.24 mg/dL — ABNORMAL HIGH (ref 0.44–1.00)
GFR, Estimated: 51 mL/min — ABNORMAL LOW (ref >60)
Glucose, Bld: 180 mg/dL — ABNORMAL HIGH (ref 70–99)
Potassium: 4.6 mmol/L (ref 3.5–5.1)
Sodium: 135 mmol/L (ref 135–145)
Total Bilirubin: 0.6 mg/dL (ref 0.3–1.2)
Total Protein: 6.5 g/dL (ref 6.5–8.1)

## 2022-03-01 LAB — CBC WITH DIFFERENTIAL/PLATELET
Abs Immature Granulocytes: 0.05 10*3/uL (ref 0.00–0.07)
Basophils Absolute: 0 10*3/uL (ref 0.0–0.1)
Basophils Relative: 0 %
Eosinophils Absolute: 0 10*3/uL (ref 0.0–0.5)
Eosinophils Relative: 1 %
HCT: 24.8 % — ABNORMAL LOW (ref 36.0–46.0)
Hemoglobin: 7.5 g/dL — ABNORMAL LOW (ref 12.0–15.0)
Immature Granulocytes: 1 %
Lymphocytes Relative: 21 %
Lymphs Abs: 1.3 10*3/uL (ref 0.7–4.0)
MCH: 26.7 pg (ref 26.0–34.0)
MCHC: 30.2 g/dL (ref 30.0–36.0)
MCV: 88.3 fL (ref 80.0–100.0)
Monocytes Absolute: 1 10*3/uL (ref 0.1–1.0)
Monocytes Relative: 17 %
Neutro Abs: 3.7 10*3/uL (ref 1.7–7.7)
Neutrophils Relative %: 60 %
Platelets: 248 10*3/uL (ref 150–400)
RBC: 2.81 MIL/uL — ABNORMAL LOW (ref 3.87–5.11)
RDW: 18.3 % — ABNORMAL HIGH (ref 11.5–15.5)
WBC: 6.1 10*3/uL (ref 4.0–10.5)
nRBC: 0.3 % — ABNORMAL HIGH (ref 0.0–0.2)

## 2022-03-01 LAB — MAGNESIUM: Magnesium: 1.8 mg/dL (ref 1.7–2.4)

## 2022-03-01 LAB — PREPARE RBC (CROSSMATCH)

## 2022-03-01 MED ORDER — ACETAMINOPHEN 325 MG PO TABS
650.0000 mg | ORAL_TABLET | Freq: Once | ORAL | Status: AC
  Administered 2022-03-01: 650 mg via ORAL
  Filled 2022-03-01: qty 2

## 2022-03-01 MED ORDER — SODIUM CHLORIDE 0.9 % IV SOLN: INTRAVENOUS | Status: DC

## 2022-03-01 MED ORDER — SODIUM CHLORIDE 0.9 % IV SOLN
20.0000 mg | Freq: Once | INTRAVENOUS | Status: AC
  Administered 2022-03-01: 20 mg via INTRAVENOUS
  Filled 2022-03-01: qty 20

## 2022-03-01 MED ORDER — ACYCLOVIR 400 MG PO TABS: ORAL_TABLET | ORAL | 6 refills | Status: AC

## 2022-03-01 MED ORDER — DIPHENHYDRAMINE HCL 25 MG PO CAPS
25.0000 mg | ORAL_CAPSULE | Freq: Once | ORAL | Status: AC
  Administered 2022-03-01: 25 mg via ORAL
  Filled 2022-03-01: qty 1

## 2022-03-01 MED ORDER — HEPARIN SOD (PORK) LOCK FLUSH 100 UNIT/ML IV SOLN
500.0000 [IU] | Freq: Every day | INTRAVENOUS | Status: AC | PRN
  Administered 2022-03-01: 500 [IU]

## 2022-03-01 MED ORDER — SODIUM CHLORIDE 0.9% IV SOLUTION: 250.0000 mL | Freq: Once | INTRAVENOUS | Status: DC

## 2022-03-01 MED ORDER — SODIUM CHLORIDE 0.9% FLUSH
10.0000 mL | INTRAVENOUS | Status: AC | PRN
  Administered 2022-03-01: 10 mL

## 2022-03-01 NOTE — Progress Notes (Signed)
We will hold tx today d/t pt continues to run fevers 2.5 weeks post testing Covid positive. Plan to give 1 unit PRBC and NS 1L over 2 hours per MD.

## 2022-03-01 NOTE — Patient Instructions (Signed)
[facility]  Discharge Instructions: Thank you for choosing [facility] to provide your oncology and hematology care.  If you have a lab appointment with the [facility], please come in thru the Main Entrance and check in at the main information desk.  Wear comfortable clothing and clothing appropriate for easy access to any Portacath or PICC line.   We strive to give you quality time with your provider. You may need to reschedule your appointment if you arrive late (15 or more minutes).  Arriving late affects you and other patients whose appointments are after yours.  Also, if you miss three or more appointments without notifying the office, you may be dismissed from the clinic at the provider's discretion.      For prescription refill requests, have your pharmacy contact our office and allow 72 hours for refills to be completed.    Today you received the following 1 liter of Normal Saline and 20 mg of Dexamethasone, return as scheduled.   To help prevent nausea and vomiting after your treatment, we encourage you to take your nausea medication as directed.  BELOW ARE SYMPTOMS THAT SHOULD BE REPORTED IMMEDIATELY: *FEVER GREATER THAN 100.4 F (38 C) OR HIGHER *CHILLS OR SWEATING *NAUSEA AND VOMITING THAT IS NOT CONTROLLED WITH YOUR NAUSEA MEDICATION *UNUSUAL SHORTNESS OF BREATH *UNUSUAL BRUISING OR BLEEDING *URINARY PROBLEMS (pain or burning when urinating, or frequent urination) *BOWEL PROBLEMS (unusual diarrhea, constipation, pain near the anus) TENDERNESS IN MOUTH AND THROAT WITH OR WITHOUT PRESENCE OF ULCERS (sore throat, sores in mouth, or a toothache) UNUSUAL RASH, SWELLING OR PAIN  UNUSUAL VAGINAL DISCHARGE OR ITCHING   Items with * indicate a potential emergency and should be followed up as soon as possible or go to the Emergency Department if any problems should occur.  Please show the CHEMOTHERAPY ALERT CARD or IMMUNOTHERAPY ALERT CARD at  check-in to the Emergency Department and triage nurse.  Should you have questions after your visit or need to cancel or reschedule your appointment, please contact [facility] [phone]  and follow the prompts.  Office hours are 8:00 a.m. to 4:30 p.m. Monday - Friday. Please note that voicemails left after 4:00 p.m. may not be returned until the following business day.  We are closed weekends and major holidays. You have access to a nurse at all times for urgent questions. Please call the main number to the clinic [phone] and follow the prompts.  For any non-urgent questions, you may also contact your provider using MyChart. We now offer e-Visits for anyone 51 and older to request care online for non-urgent symptoms. For details visit [email or web address].   Also download the MyChart app! Go to the app store, search "MyChart", open the app, select [facility], and log in with your MyChart username and password.  Masks are optional in the cancer centers. If you would like for your care team to wear a mask while they are taking care of you, please let them know. You may have one support person who is at least 57 years old accompany you for your appointments.

## 2022-03-01 NOTE — Progress Notes (Signed)
Patient presents today for possible treatment. No treatment today per Dr. Delton Coombes, Hemoglobin 7.5 patient will receive one unit of blood and 1 L of Normal Saline and '20mg'$  of IV Dexamethasone per MD orders. Due to patient having antibiotics patient will return tomorrow for blood transfusion., Patient instructed to not remove her blood bracelet off and return tomorrow for blood. Patient tolerated hydration therapy with no complaints voiced. Side effects with management reviewed with understanding verbalized. Port site clean and dry with no bruising or swelling noted at site. Good blood return noted before and after administration of therapy. Band aid applied. Patient left in satisfactory condition with VSS and no s/s of distress noted.

## 2022-03-01 NOTE — Patient Instructions (Addendum)
[place] at [facility] Discharge Instructions   You were seen and examined today by Dr. Delton Coombes.  He reviewed the results of your lab work - your hemoglobin is low at 7.5. We will plan to give you a unit of blood. This may help with your energy. You kidney function (creatinine) is also elevated. We will give you some IV fluids in the clinic today.   We will hold your treatment today.   Return as scheduled.    Thank you for choosing [place] at [facility] to provide your oncology and hematology care.  To afford each patient quality time with our provider, please arrive at least 15 minutes before your scheduled appointment time.   If you have a lab appointment with the [facility] please come in thru the Main Entrance and check in at the main information desk.  You need to re-schedule your appointment should you arrive 10 or more minutes late.  We strive to give you quality time with our providers, and arriving late affects you and other patients whose appointments are after yours.  Also, if you no show three or more times for appointments you may be dismissed from the clinic at the providers discretion.     Again, thank you for choosing [facility].  Our hope is that these requests will decrease the amount of time that you wait before being seen by our physicians.       _____________________________________________________________  Should you have questions after your visit to [facility], please contact our office at [phone] and follow the prompts.  Our office hours are 8:00 a.m. and 4:30 p.m. Monday - Friday.  Please note that voicemails left after 4:00 p.m. may not be returned until the following business day.  We are closed weekends and major holidays.  You do have access to a nurse 24-7, just call the main number to the clinic [phone] and do not press any options, hold on the line and a nurse will  answer the phone.    For prescription refill requests, have your pharmacy contact our office and allow 72 hours.    Due to Covid, you will need to wear a mask upon entering the hospital. If you do not have a mask, a mask will be given to you at the Main Entrance upon arrival. For doctor visits, patients may have 1 support person age 66 or older with them. For treatment visits, patients can not have anyone with them due to social distancing guidelines and our immunocompromised population.

## 2022-03-01 NOTE — Progress Notes (Signed)
[name], [name] [address]   CLINIC:  Medical Oncology/Hematology  PCP:  Lindell Spar, MD [address] / [place] [address] [phone]   REASON FOR VISIT:  Follow-up for multiple myeloma  PRIOR THERAPY:  1. RVD x 4 cycles from 11/01/2017 through 01/14/2018. 2. Stem cell transplant on 02/20/2018. 3. Velcade from 06/11/2018 to 12/22/2019.  NGS Results: not done  CURRENT THERAPY: Daratumumab, carfilzomib and dexamethasone  BRIEF ONCOLOGIC HISTORY:  Oncology History  Multiple myeloma ([place])  10/29/2017 Initial Diagnosis   Multiple myeloma ([place])   11/01/2017 - 01/24/2018 Chemotherapy   The patient had dexamethasone (DECADRON) 4 MG tablet, 1 of 1 cycle, Start date: --, End date: -- lenalidomide (REVLIMID) 25 MG capsule, 1 of 1 cycle, Start date: --, End date: -- bortezomib SQ (VELCADE) chemo injection 3 mg, 1.3 mg/m2 = 3 mg, Subcutaneous,  Once, 5 of 5 cycles Administration: 3 mg (11/01/2017), 3 mg (11/08/2017), 3 mg (11/05/2017), 3 mg (11/22/2017), 3 mg (11/12/2017), 3 mg (11/29/2017), 3 mg (11/26/2017), 3 mg (12/03/2017), 3 mg (12/13/2017), 3 mg (12/20/2017), 3 mg (12/17/2017), 3 mg (12/24/2017), 3 mg (01/03/2018), 3 mg (01/10/2018), 3 mg (01/07/2018), 3 mg (01/14/2018), 3 mg (01/24/2018)  for chemotherapy treatment.    06/11/2018 - 12/20/2021 Chemotherapy   Patient is on Treatment Plan : MYELOMA MAINTENANCE Bortezomib SQ q 7d x 6 weeks, two weeks off then q 14d     01/25/2022 - 02/08/2022 Chemotherapy   Patient is on Treatment Plan : MYELOMA RELAPSED/REFRACTORY Carfilzomib (20/70) + Daratumumab SQ + Dexamethasone (20/40) DaraKd q28d     01/25/2022 -  Chemotherapy   Patient is on Treatment Plan : MYELOMA RELAPSED/REFRACTORY Carfilzomib (20/70) + Daratumumab SQ + Dexamethasone (20/40) DaraKd q28d       CANCER STAGING:  Cancer Staging  Multiple myeloma ([place]) Staging form: Plasma Cell Myeloma and Plasma Cell Disorders, AJCC 8th Edition - Clinical: No  stage assigned - Unsigned - Clinical: No stage assigned - Unsigned   INTERVAL HISTORY:  [name], a 56 y.o. female, seen for follow-up of multiple myeloma.  She has received 3 weekly doses of treatment for cycle 1.  She was diagnosed with COVID-19 on 02/19/2022.  We held her treatment since then.  She reports feeling very tired.  She is still continuing to have fevers of 101 in the afternoons.  She reportedly had this fevers even prior to diagnosis of COVID.   REVIEW OF SYSTEMS:  Review of Systems  Constitutional:  Negative for appetite change and fatigue.  Respiratory:  Positive for shortness of breath.   Gastrointestinal:  Positive for diarrhea and nausea (mild).  Neurological:  Positive for headaches and numbness.  All other systems reviewed and are negative.   PAST MEDICAL/SURGICAL HISTORY:  Past Medical History:  Diagnosis Date   Acid reflux    Allergic rhinitis    Cancer ([place])    multiple myeloma   Diabetes mellitus    type 2   Gout    Gout    H/o COVID-19--- was Positive 09/17/2019, Negative 12/23/19 AND also Neg on  12/26/19 12/26/2019   HBP (high blood pressure)    History of kidney stones    Migraines    Past Surgical History:  Procedure Laterality Date   BREAST CYST EXCISION Left    2009 no visible scar on skin   CESAREAN SECTION     COLONOSCOPY WITH PROPOFOL N/A 12/25/2019   Procedure: COLONOSCOPY WITH PROPOFOL;  Surgeon: Rogene Houston, MD;  [facility];  Service: Endoscopy;  Laterality: N/A;  730   EXTRACORPOREAL SHOCK WAVE LITHOTRIPSY Left 10/10/2017   Procedure: LEFT EXTRACORPOREAL SHOCK WAVE LITHOTRIPSY (ESWL);  Surgeon: Bjorn Loser, MD;  Location: [facility];  Service: Urology;  Laterality: Left;   EYE SURGERY     HEMORRHOID SURGERY N/A 11/19/2012   Procedure: HEMORRHOIDECTOMY;  Surgeon: Jamesetta So, MD;  Location: [facility];  Service: General;  Laterality: N/A;   IR IMAGING GUIDED PORT INSERTION  01/18/2022   kidney stones  1998    LAPAROSCOPIC UNILATERAL SALPINGO OOPHERECTOMY  05/14/2012   Procedure: LAPAROSCOPIC UNILATERAL SALPINGO OOPHORECTOMY;  Surgeon: [name], MD;  Location: [facility];  Service: Gynecology;  Laterality: Right;  laparoscopic right salpingo-oophorectomy   PARTIAL HYSTERECTOMY     POLYPECTOMY  12/25/2019   Procedure: POLYPECTOMY;  Surgeon: Rogene Houston, MD;  Location: [facility];  Service: Endoscopy;;   TONSILECTOMY, ADENOIDECTOMY, BILATERAL MYRINGOTOMY AND TUBES     VESICOVAGINAL FISTULA CLOSURE W/ TAH      SOCIAL HISTORY:  Social History   Socioeconomic History   Marital status: Married    Spouse name: [name]   Number of children: 1   Years of education: 12   Highest education level: Some college, no degree  Occupational History    Employer: [employer]  Tobacco Use   Smoking status: Former    Types: Cigarettes    Quit date: 02/27/1999    Years since quitting: 23.0   Smokeless tobacco: Never   Tobacco comments:    socially   Vaping Use   Vaping Use: Never used  Substance and Sexual Activity   Alcohol use: No    Alcohol/week: 0.0 standard drinks of alcohol   Drug use: No   Sexual activity: Yes    Birth control/protection: Surgical    Comment: hyst  Other Topics Concern   Not on file  Social History Narrative   Not on file   Social Determinants of Health   Financial Resource Strain: Low Risk  (08/08/2021)   Overall Financial Resource Strain (CARDIA)    Difficulty of Paying Living Expenses: Not hard at all  Food Insecurity: No Food Insecurity (11/09/2019)   Hunger Vital Sign    Worried About Running Out of Food in the Last Year: Never true    [place] in the Last Year: Never true  Transportation Needs: No Transportation Needs (08/08/2021)   PRAPARE - Hydrologist (Medical): No    Lack of Transportation (Non-Medical): No  Physical Activity: Unknown (08/08/2021)   Exercise Vital Sign    Days of Exercise per Week: Not on file    Minutes of  Exercise per Session: 10 min  Stress: No Stress Concern Present (08/08/2021)   [place]    Feeling of Stress : Not at all  Social Connections: [place] (08/08/2021)   Social Connection and Isolation Panel [NHANES]    Frequency of Communication with Friends and Family: More than three times a week    Frequency of Social Gatherings with Friends and Family: Twice a week    Attends Religious Services: More than 4 times per year    Active Member of [employer] or Organizations: Yes    Attends Archivist Meetings: More than 4 times per year    Marital Status: Married  Human resources officer Violence: Not At Risk (08/08/2021)   Humiliation, Afraid,  Rape, and Kick questionnaire    Fear of Current or Ex-Partner: No    Emotionally Abused: No    Physically Abused: No    Sexually Abused: No    FAMILY HISTORY:  Family History  Problem Relation Age of Onset   Arthritis Other    Cancer Other    Diabetes Other    Hypertension Mother    Dementia Mother    Diabetes Father    ALS Father    Diabetes Brother    Hypertension Brother    Cancer Paternal Aunt    COPD Maternal Grandmother    Cancer Maternal Grandfather    Anesthesia problems Paternal Grandfather     CURRENT MEDICATIONS:  Current Outpatient Medications  Medication Sig Dispense Refill   acyclovir (ZOVIRAX) 400 MG tablet TAKE (1) TABLET BY MOUTH TWICE DAILY. 60 tablet 0   albuterol (VENTOLIN HFA) 108 (90 Base) MCG/ACT inhaler Inhale 2 puffs into the lungs every 4 (four) hours as needed for wheezing or shortness of breath. 1 each 3   allopurinol (ZYLOPRIM) 300 MG tablet TAKE (1) TABLET BY MOUTH ONCE DAILY. 90 tablet 0   amLODipine (NORVASC) 2.5 MG tablet Take 2.5 mg by mouth daily.     aspirin EC 81 MG tablet Take 1 tablet (81 mg total) by mouth daily. 30 tablet 11   BD PEN NEEDLE NANO U/F 32G X 4 MM MISC Inject into the skin.     bumetanide (BUMEX) 0.5 MG  tablet Take 0.5 mg by mouth daily.      carvedilol (COREG) 25 MG tablet Take 1 tablet (25 mg total) by mouth 2 (two) times daily.     DARZALEX FASPRO 1800-30000 MG-UT/15ML SOLN Inject into the skin.     diazepam (VALIUM) 5 MG tablet TAKE (1) TABLET BY MOUTH AT BEDTIME. 30 tablet 0   diclofenac Sodium (VOLTAREN) 1 % GEL Apply 2 g topically 4 (four) times daily. 100 g 0   dicyclomine (BENTYL) 20 MG tablet Take 1 tablet (20 mg total) by mouth 2 (two) times daily as needed for spasms. 20 tablet 0   Docosanol 10 % CREA Apply 1 application topically daily as needed (for nose).      docusate sodium (COLACE) 100 MG capsule Take 100 mg by mouth daily.      Dulaglutide 1.5 MG/0.5ML SOPN Inject 1.5 mg into the skin every 7 (seven) days.     erythromycin ophthalmic ointment Place 1 application into the left eye at bedtime. 3.5 g 0   escitalopram (LEXAPRO) 10 MG tablet TAKE (1) TABLET BY MOUTH ONCE DAILY. MAY START WITH 1/2 TABLET FOR 7 DAYS. 90 tablet 0   fluticasone (FLONASE) 50 MCG/ACT nasal spray INSTILL 2 SPRAYS INTO BOTH NOSTRILS DAILY 16 g 4   Insulin Lispro w/ Trans Port 100 UNIT/ML SOPN Inject before meals per sliding scale. Max TDD 20 units     KYPROLIS 30 MG SOLR Inject into the vein.     KYPROLIS 60 MG SOLR INFUSE 40MG INTRAVENOUSLY FOR 1 DOSE 1 each 0   Lactulose 20 GM/30ML SOLN Take 30 mLs (20 g total) by mouth daily. 450 mL 1   lidocaine-prilocaine (EMLA) cream Apply 1 Application topically as needed (Apply to port prior to treatment and flushes). 30 g 6   losartan (COZAAR) 100 MG tablet Take 100 mg by mouth daily.     magnesium oxide (MAG-OX) 400 (241.3 Mg) MG tablet Take 1 tablet (400 mg total) by mouth 3 (three) times daily. 90 tablet  3   metFORMIN (GLUCOPHAGE) 500 MG tablet Take 500 mg by mouth 2 (two) times daily.     nitrofurantoin, macrocrystal-monohydrate, (MACROBID) 100 MG capsule TAKE (1) CAPSULE BY MOUTH AT BEDTIME. 90 capsule 0   NOVOLOG FLEXPEN 100 UNIT/ML FlexPen Inject into the  skin.     ondansetron (ZOFRAN) 8 MG tablet TAKE 1 TABLET BY MOUTH EVERY 8 HOURS AS NEEDED. 30 tablet 6   pantoprazole (PROTONIX) 40 MG tablet TAKE (1) TABLET BY MOUTH ONCE DAILY. 90 tablet 0   POMALYST 2 MG capsule TAKE 1 CAPSULE BY MOUTH ONCE DAILY FOR 21 DAYS ON AND 7 DAYS OFF 21 capsule 0   pregabalin (LYRICA) 200 MG capsule TAKE (1) CAPSULE BY MOUTH TWICE DAILY. 60 capsule 11   promethazine (PHENERGAN) 25 MG tablet Take 1 tablet (25 mg total) by mouth every 6 (six) hours as needed for nausea or vomiting. 60 tablet 3   Propylene Glycol (SYSTANE BALANCE) 0.6 % SOLN Apply 1 drop to eye daily as needed (dry eye).      rosuvastatin (CRESTOR) 5 MG tablet Take 5 mg by mouth daily.     valACYclovir (VALTREX) 1000 MG tablet TAKE 2 TABLETS BY MOUTH NOW; THEN 2 12 HOURS LATER. 12 tablet 0   VELCADE 3.5 MG injection RECONSTITUTE EACH VIAL AS DIRECTED. INJECT 2.5 MG UNDER THE SKIN EVERY 14 DAYS. 2 each 2   No current facility-administered medications for this visit.    ALLERGIES:  No Known Allergies  PHYSICAL EXAM:  Performance status (ECOG): 1 - Symptomatic but completely ambulatory  There were no vitals filed for this visit. Wt Readings from Last 3 Encounters:  03/01/22 257 lb (116.6 kg)  02/19/22 259 lb (117.5 kg)  02/15/22 262 lb (118.8 kg)   Physical Exam Vitals reviewed.  Constitutional:      Appearance: Normal appearance. She is obese.  Cardiovascular:     Rate and Rhythm: Normal rate and regular rhythm.     Pulses: Normal pulses.     Heart sounds: Normal heart sounds.  Pulmonary:     Effort: Pulmonary effort is normal.     Breath sounds: Normal breath sounds.  Neurological:     General: No focal deficit present.     Mental Status: She is alert and oriented to person, place, and time.  Psychiatric:        Mood and Affect: Mood normal.        Behavior: Behavior normal.    LABORATORY DATA:  I have reviewed the labs as listed.     Latest Ref Rng & Units 03/01/2022    8:06  AM 02/19/2022   11:39 AM 02/15/2022   12:46 PM  CBC  WBC 4.0 - 10.5 K/uL 6.1  3.5  3.4   Hemoglobin 12.0 - 15.0 g/dL 7.5  8.3  8.1   Hematocrit 36.0 - 46.0 % 24.8  27.5  26.0   Platelets 150 - 400 K/uL 248  132  82       Latest Ref Rng & Units 03/01/2022    8:06 AM 02/19/2022   11:39 AM 02/15/2022   12:46 PM  CMP  Glucose 70 - 99 mg/dL 180  187  133   BUN 6 - 20 mg/dL 22  16  15    Creatinine 0.44 - 1.00 mg/dL 1.24  0.99  0.88   Sodium 135 - 145 mmol/L 135  137  137   Potassium 3.5 - 5.1 mmol/L 4.6  3.5  3.8   Chloride  98 - 111 mmol/L 102  106  105   CO2 22 - 32 mmol/L 23  23  23    Calcium 8.9 - 10.3 mg/dL 8.5  8.4  8.8   Total Protein 6.5 - 8.1 g/dL 6.5   6.4   Total Bilirubin 0.3 - 1.2 mg/dL 0.6   0.5   Alkaline Phos 38 - 126 U/L 135   103   AST 15 - 41 U/L 23   17   ALT 0 - 44 U/L 15   14     DIAGNOSTIC IMAGING:  I have independently reviewed the scans and discussed with the patient. DG Chest Portable 1 View  Result Date: 02/19/2022 CLINICAL DATA:  Chest pain EXAM: PORTABLE CHEST 1 VIEW COMPARISON:  Radiograph 01/22/2022 FINDINGS: Chest port catheter tip overlies the distal superior vena cava. Unchanged cardiomediastinal silhouette. There is no focal airspace consolidation. There is no pleural effusion. No pneumothorax. There is no acute osseous abnormality. IMPRESSION: No evidence of acute cardiopulmonary disease. Electronically Signed   By: Maurine Simmering M.D.   On: 02/19/2022 11:36     ASSESSMENT:  1.  IgG lambda multiple myeloma, stage III, del 17 p: -4 cycles of RVD from 11/01/2017 through 01/14/2018. -Stem cell transplant on 02/20/2018 at [facility]. -Maintenance Velcade every 2 weeks and Revlimid 10 mg 3 weeks on/1 week off started on 07/29/2018. -Myeloma panel from 12/08/2018 shows M spike not observed.  Kappa light chains of 32.1 with ratio of 1.64.  Immunofixation was negative. 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  MRI of the brain on 12/27/2019 shows acute ischemic nonhemorrhagic  left MCA territory infarct involving left parietal lobe, corresponding with perfusion deficit on CT scan angiogram.  No associated hemorrhage or mass-effect.  Additional few scattered punctate acute ischemic nonhemorrhagic infarcts involving bilateral frontal and parietal lobes as well as left cerebellum. - Revlimid was on hold since June 2021 due to potential for contributing to CVA. - Velcade dose was reduced to 1 mg per metered square on 12/28/2020 due to worsening neuropathy in the feet.,  Pomalidomide 2 mg 3 weeks on/1 week off started around 09/20/2021, discontinued on 01/17/2022 due to progression. - Daratumumab, carfilzomib and dexamethasone (DKd) started on   2.  CVA with aphasia: -We held her myeloma treatments since CVA with aphasia on 12/26/2019. -Her aphasia is improving.   PLAN:  1.  IgG lambda multiple myeloma, stage III, del 17 p: -Cycle 1 of daratumumab, carfilzomib and dexamethasone started on 01/25/2022, last treatment day 15 on 02/08/2022. - COVID diagnosed on 02/19/2022.  She was treated with 5 days of Paxlovid. - She is continuing to have afternoon fevers of 101.  She reports tiredness. - Reviewed labs today which showed creatinine 1.24.  I will hold her treatment today.  We will give her 1 L of normal saline.  Hemoglobin is 7.5.  We will give her 1 unit PRBC. - We will reevaluate her next week for possible treatment.  RTC 5 weeks for follow-up with repeat myeloma labs.   2.  Hypomagnesemia: - Continue magnesium 3 times daily.   3.  Bone strengthening agents: - New Zometa every 4 weeks.   4.  Peripheral neuropathy: - Continue Lyrica 200 mg twice daily.   5.  ID prophylaxis: - Continue acyclovir twice daily.   6.  Diabetes: - Continue metformin daily and Trulicity weekly.   7.  Recurrent UTIs: - Continue Macrobid 100 mg daily for prophylaxis.   Orders placed this encounter:  Orders  Placed This Encounter  Procedures   CBC with Differential   Comprehensive metabolic  panel   CBC with Differential   Comprehensive metabolic panel   CBC with Differential   Comprehensive metabolic panel   CBC with Differential   CBC with Differential   Comprehensive metabolic panel   CBC with Differential   Comprehensive metabolic panel   CBC with Differential   Comprehensive metabolic panel   CBC with Differential   Comprehensive metabolic panel   CBC with Differential   Comprehensive metabolic panel   CBC with Differential   Comprehensive metabolic panel   CBC with Differential   Comprehensive metabolic panel   CBC with Differential   Comprehensive metabolic panel   CBC with Differential   Comprehensive metabolic panel   CBC with Differential   Comprehensive metabolic panel   CBC with Differential   Comprehensive metabolic panel   CBC with Differential   Comprehensive metabolic panel   CBC with Differential   Comprehensive metabolic panel   Kappa/lambda light chains   Protein electrophoresis, serum   Kappa/lambda light chains   Protein electrophoresis, serum   Kappa/lambda light chains   Protein electrophoresis, serum      Derek Jack, MD [place] [phone]   I, Thana Ates, am acting as a scribe for Dr. Derek Jack.  I, Derek Jack MD, have reviewed the above documentation for accuracy and completeness, and I agree with the above.

## 2022-03-02 ENCOUNTER — Inpatient Hospital Stay: Payer: BC Managed Care – PPO

## 2022-03-02 VITALS — BP 120/67 | HR 81 | Temp 96.9°F | Resp 18

## 2022-03-02 DIAGNOSIS — C9000 Multiple myeloma not having achieved remission: Secondary | ICD-10-CM

## 2022-03-02 DIAGNOSIS — Z5112 Encounter for antineoplastic immunotherapy: Secondary | ICD-10-CM | POA: Diagnosis not present

## 2022-03-02 LAB — BPAM RBC
Blood Product Expiration Date: 202309092359
Unit Type and Rh: 6200

## 2022-03-02 MED ORDER — SODIUM CHLORIDE 0.9% FLUSH
10.0000 mL | INTRAVENOUS | Status: AC | PRN
  Administered 2022-03-02: 10 mL

## 2022-03-02 MED ORDER — HEPARIN SOD (PORK) LOCK FLUSH 100 UNIT/ML IV SOLN
500.0000 [IU] | Freq: Every day | INTRAVENOUS | Status: AC | PRN
  Administered 2022-03-02: 500 [IU]

## 2022-03-02 MED ORDER — SODIUM CHLORIDE 0.9% IV SOLUTION
250.0000 mL | Freq: Once | INTRAVENOUS | Status: AC
  Administered 2022-03-02: 250 mL via INTRAVENOUS

## 2022-03-02 NOTE — Patient Instructions (Signed)
[facility]  Discharge Instructions: Thank you for choosing [facility] to provide your oncology and hematology care.  If you have a lab appointment with the Cancer Center, please come in thru the Main Entrance and check in at the main information desk.  Wear comfortable clothing and clothing appropriate for easy access to any Portacath or PICC line.   We strive to give you quality time with your provider. You may need to reschedule your appointment if you arrive late (15 or more minutes).  Arriving late affects you and other patients whose appointments are after yours.  Also, if you miss three or more appointments without notifying the office, you may be dismissed from the clinic at the provider's discretion.      For prescription refill requests, have your pharmacy contact our office and allow 72 hours for refills to be completed.    Today you received the following chemotherapy and/or immunotherapy agents 1 unit of blood.      To help prevent nausea and vomiting after your treatment, we encourage you to take your nausea medication as directed.  BELOW ARE SYMPTOMS THAT SHOULD BE REPORTED IMMEDIATELY: *FEVER GREATER THAN 100.4 F (38 C) OR HIGHER *CHILLS OR SWEATING *NAUSEA AND VOMITING THAT IS NOT CONTROLLED WITH YOUR NAUSEA MEDICATION *UNUSUAL SHORTNESS OF BREATH *UNUSUAL BRUISING OR BLEEDING *URINARY PROBLEMS (pain or burning when urinating, or frequent urination) *BOWEL PROBLEMS (unusual diarrhea, constipation, pain near the anus) TENDERNESS IN MOUTH AND THROAT WITH OR WITHOUT PRESENCE OF ULCERS (sore throat, sores in mouth, or a toothache) UNUSUAL RASH, SWELLING OR PAIN  UNUSUAL VAGINAL DISCHARGE OR ITCHING   Items with * indicate a potential emergency and should be followed up as soon as possible or go to the Emergency Department if any problems should occur.  Please show the CHEMOTHERAPY ALERT CARD or IMMUNOTHERAPY ALERT CARD at check-in to the  Emergency Department and triage nurse.  Should you have questions after your visit or need to cancel or reschedule your appointment, please contact [facility] [phone]  and follow the prompts.  Office hours are 8:00 a.m. to 4:30 p.m. Monday - Friday. Please note that voicemails left after 4:00 p.m. may not be returned until the following business day.  We are closed weekends and major holidays. You have access to a nurse at all times for urgent questions. Please call the main number to the clinic [phone] and follow the prompts.  For any non-urgent questions, you may also contact your provider using MyChart. We now offer e-Visits for anyone 18 and older to request care online for non-urgent symptoms. For details visit [email or web address].   Also download the MyChart app! Go to the app store, search "MyChart", open the app, select [facility], and log in with your MyChart username and password.  Masks are optional in the cancer centers. If you would like for your care team to wear a mask while they are taking care of you, please let them know. You may have one support person who is at least 57 years old accompany you for your appointments.  

## 2022-03-02 NOTE — Progress Notes (Signed)
Patient presents today for 1 unit of PRBC's. Vitals signs stable. Patient has no complaints of any changes since his last visit. MAR reviewed and updated.    1 unit of blood given today per MD orders. Tolerated infusion without adverse affects. Vital signs stable. No complaints at this time. Discharged from clinic ambulatory in stable condition. Alert and oriented x 3. F/U with [facility] as scheduled.

## 2022-03-03 LAB — TYPE AND SCREEN
ABO/RH(D): A POS
Antibody Screen: POSITIVE
Donor AG Type: NEGATIVE
Unit division: 0

## 2022-03-08 ENCOUNTER — Inpatient Hospital Stay: Payer: BC Managed Care – PPO

## 2022-03-08 VITALS — BP 100/63 | HR 72 | Temp 97.6°F | Resp 18

## 2022-03-08 DIAGNOSIS — C9000 Multiple myeloma not having achieved remission: Secondary | ICD-10-CM

## 2022-03-08 DIAGNOSIS — Z5112 Encounter for antineoplastic immunotherapy: Secondary | ICD-10-CM | POA: Diagnosis not present

## 2022-03-08 LAB — CBC WITH DIFFERENTIAL/PLATELET
Abs Immature Granulocytes: 0.13 10*3/uL — ABNORMAL HIGH (ref 0.00–0.07)
Basophils Absolute: 0 10*3/uL (ref 0.0–0.1)
Basophils Relative: 0 %
Eosinophils Absolute: 0 10*3/uL (ref 0.0–0.5)
Eosinophils Relative: 1 %
HCT: 27.9 % — ABNORMAL LOW (ref 36.0–46.0)
Hemoglobin: 8.5 g/dL — ABNORMAL LOW (ref 12.0–15.0)
Immature Granulocytes: 2 %
Lymphocytes Relative: 21 %
Lymphs Abs: 1.4 10*3/uL (ref 0.7–4.0)
MCH: 27.5 pg (ref 26.0–34.0)
MCHC: 30.5 g/dL (ref 30.0–36.0)
MCV: 90.3 fL (ref 80.0–100.0)
Monocytes Absolute: 1 10*3/uL (ref 0.1–1.0)
Monocytes Relative: 15 %
Neutro Abs: 4.2 10*3/uL (ref 1.7–7.7)
Neutrophils Relative %: 61 %
Platelets: 206 10*3/uL (ref 150–400)
RBC: 3.09 MIL/uL — ABNORMAL LOW (ref 3.87–5.11)
RDW: 18.2 % — ABNORMAL HIGH (ref 11.5–15.5)
WBC: 6.9 10*3/uL (ref 4.0–10.5)
nRBC: 0 % (ref 0.0–0.2)

## 2022-03-08 LAB — COMPREHENSIVE METABOLIC PANEL
ALT: 15 U/L (ref 0–44)
AST: 16 U/L (ref 15–41)
Albumin: 2.6 g/dL — ABNORMAL LOW (ref 3.5–5.0)
Alkaline Phosphatase: 122 U/L (ref 38–126)
Anion gap: 8 (ref 5–15)
BUN: 16 mg/dL (ref 6–20)
CO2: 23 mmol/L (ref 22–32)
Calcium: 8.2 mg/dL — ABNORMAL LOW (ref 8.9–10.3)
Chloride: 105 mmol/L (ref 98–111)
Creatinine, Ser: 0.91 mg/dL (ref 0.44–1.00)
GFR, Estimated: >60 mL/min (ref >60)
Glucose, Bld: 141 mg/dL — ABNORMAL HIGH (ref 70–99)
Potassium: 3.9 mmol/L (ref 3.5–5.1)
Sodium: 136 mmol/L (ref 135–145)
Total Bilirubin: 0.7 mg/dL (ref 0.3–1.2)
Total Protein: 6.1 g/dL — ABNORMAL LOW (ref 6.5–8.1)

## 2022-03-08 LAB — MAGNESIUM: Magnesium: 1.6 mg/dL — ABNORMAL LOW (ref 1.7–2.4)

## 2022-03-08 LAB — SAMPLE TO BLOOD BANK

## 2022-03-08 MED ORDER — SODIUM CHLORIDE 0.9% FLUSH
10.0000 mL | INTRAVENOUS | Status: DC | PRN
  Administered 2022-03-08: 10 mL

## 2022-03-08 MED ORDER — DIPHENHYDRAMINE HCL 25 MG PO CAPS
50.0000 mg | ORAL_CAPSULE | Freq: Once | ORAL | Status: AC
  Administered 2022-03-08: 50 mg via ORAL
  Filled 2022-03-08: qty 2

## 2022-03-08 MED ORDER — SODIUM CHLORIDE 0.9 % IV SOLN: Freq: Once | INTRAVENOUS | Status: AC

## 2022-03-08 MED ORDER — ACETAMINOPHEN 325 MG PO TABS
650.0000 mg | ORAL_TABLET | Freq: Once | ORAL | Status: AC
  Administered 2022-03-08: 650 mg via ORAL
  Filled 2022-03-08: qty 2

## 2022-03-08 MED ORDER — HEPARIN SOD (PORK) LOCK FLUSH 100 UNIT/ML IV SOLN
500.0000 [IU] | Freq: Once | INTRAVENOUS | Status: AC | PRN
  Administered 2022-03-08: 500 [IU]

## 2022-03-08 MED ORDER — DARATUMUMAB-HYALURONIDASE-FIHJ 1800-30000 MG-UT/15ML ~~LOC~~ SOLN
1800.0000 mg | Freq: Once | SUBCUTANEOUS | Status: AC
  Administered 2022-03-08: 1800 mg via SUBCUTANEOUS
  Filled 2022-03-08: qty 15

## 2022-03-08 MED ORDER — DEXTROSE 5 % IV SOLN
70.0000 mg/m2 | Freq: Once | INTRAVENOUS | Status: AC
  Administered 2022-03-08: 150 mg via INTRAVENOUS
  Filled 2022-03-08: qty 75

## 2022-03-08 MED ORDER — SODIUM CHLORIDE 0.9 % IV SOLN
20.0000 mg | Freq: Once | INTRAVENOUS | Status: AC
  Administered 2022-03-08: 20 mg via INTRAVENOUS
  Filled 2022-03-08: qty 20

## 2022-03-08 NOTE — Patient Instructions (Signed)
[facility]  Discharge Instructions: Thank you for choosing [facility] to provide your oncology and hematology care.  If you have a lab appointment with the [facility], please come in thru the Main Entrance and check in at the main information desk.  Wear comfortable clothing and clothing appropriate for easy access to any Portacath or PICC line.   We strive to give you quality time with your provider. You may need to reschedule your appointment if you arrive late (15 or more minutes).  Arriving late affects you and other patients whose appointments are after yours.  Also, if you miss three or more appointments without notifying the office, you may be dismissed from the clinic at the provider's discretion.      For prescription refill requests, have your pharmacy contact our office and allow 72 hours for refills to be completed.    Today you received the following chemotherapy and/or immunotherapy agents Kyrolis/Daratumumab.       To help prevent nausea and vomiting after your treatment, we encourage you to take your nausea medication as directed.  BELOW ARE SYMPTOMS THAT SHOULD BE REPORTED IMMEDIATELY: *FEVER GREATER THAN 100.4 F (38 C) OR HIGHER *CHILLS OR SWEATING *NAUSEA AND VOMITING THAT IS NOT CONTROLLED WITH YOUR NAUSEA MEDICATION *UNUSUAL SHORTNESS OF BREATH *UNUSUAL BRUISING OR BLEEDING *URINARY PROBLEMS (pain or burning when urinating, or frequent urination) *BOWEL PROBLEMS (unusual diarrhea, constipation, pain near the anus) TENDERNESS IN MOUTH AND THROAT WITH OR WITHOUT PRESENCE OF ULCERS (sore throat, sores in mouth, or a toothache) UNUSUAL RASH, SWELLING OR PAIN  UNUSUAL VAGINAL DISCHARGE OR ITCHING   Items with * indicate a potential emergency and should be followed up as soon as possible or go to the Emergency Department if any problems should occur.  Please show the CHEMOTHERAPY ALERT CARD or IMMUNOTHERAPY ALERT CARD at check-in to  the Emergency Department and triage nurse.  Should you have questions after your visit or need to cancel or reschedule your appointment, please contact [facility] [phone]  and follow the prompts.  Office hours are 8:00 a.m. to 4:30 p.m. Monday - Friday. Please note that voicemails left after 4:00 p.m. may not be returned until the following business day.  We are closed weekends and major holidays. You have access to a nurse at all times for urgent questions. Please call the main number to the clinic [phone] and follow the prompts.  For any non-urgent questions, you may also contact your provider using MyChart. We now offer e-Visits for anyone 41 and older to request care online for non-urgent symptoms. For details visit [email or web address].   Also download the MyChart app! Go to the app store, search "MyChart", open the app, select [facility], and log in with your MyChart username and password.  Masks are optional in the cancer centers. If you would like for your care team to wear a mask while they are taking care of you, please let them know. You may have one support person who is at least 57 years old accompany you for your appointments.

## 2022-03-08 NOTE — Progress Notes (Signed)
Patient presents today for chemotherapy injection/infusion.  Patient is in satisfactory condition with no new complaints voiced.  Vital signs are stable.  Labs reviewed.  Magnesium is 1.6 today.  Patient is refusing the standing order protocols today.  She will increase her magnesium at home and recheck next week.  All other labs are within treatment parameters.  We will proceed with treatment per MD orders.   Patient tolerated treatment well with no complaints voiced.  Patient left ambulatory in stable condition.  Vital signs stable at discharge.  Follow up as scheduled.

## 2022-03-13 ENCOUNTER — Other Ambulatory Visit: Payer: Self-pay | Admitting: Hematology

## 2022-03-14 ENCOUNTER — Encounter: Payer: Self-pay | Admitting: *Deleted

## 2022-03-14 ENCOUNTER — Other Ambulatory Visit: Payer: Self-pay | Admitting: *Deleted

## 2022-03-14 ENCOUNTER — Other Ambulatory Visit: Payer: Self-pay | Admitting: Hematology

## 2022-03-14 DIAGNOSIS — T451X5A Adverse effect of antineoplastic and immunosuppressive drugs, initial encounter: Secondary | ICD-10-CM

## 2022-03-14 MED ORDER — SCOPOLAMINE 1 MG/3DAYS TD PT72: 1.0000 | MEDICATED_PATCH | TRANSDERMAL | 12 refills | Status: DC

## 2022-03-14 NOTE — Progress Notes (Signed)
Patient called stating that phenergan is not helping with her nausea, which is very frequent.  Per Dr. Delton Coombes, will call in scope patches for her try.

## 2022-03-15 ENCOUNTER — Inpatient Hospital Stay: Payer: BC Managed Care – PPO

## 2022-03-15 ENCOUNTER — Telehealth: Payer: Self-pay | Admitting: *Deleted

## 2022-03-15 ENCOUNTER — Ambulatory Visit: Payer: BC Managed Care – PPO

## 2022-03-15 ENCOUNTER — Inpatient Hospital Stay: Payer: BC Managed Care – PPO | Attending: Hematology

## 2022-03-15 ENCOUNTER — Other Ambulatory Visit: Payer: BC Managed Care – PPO

## 2022-03-15 VITALS — BP 142/81 | HR 81 | Temp 97.2°F | Resp 20

## 2022-03-15 DIAGNOSIS — Z5112 Encounter for antineoplastic immunotherapy: Secondary | ICD-10-CM | POA: Insufficient documentation

## 2022-03-15 DIAGNOSIS — Z79899 Other long term (current) drug therapy: Secondary | ICD-10-CM | POA: Insufficient documentation

## 2022-03-15 DIAGNOSIS — C9000 Multiple myeloma not having achieved remission: Secondary | ICD-10-CM

## 2022-03-15 DIAGNOSIS — D649 Anemia, unspecified: Secondary | ICD-10-CM | POA: Diagnosis not present

## 2022-03-15 LAB — COMPREHENSIVE METABOLIC PANEL
ALT: 15 U/L (ref 0–44)
AST: 16 U/L (ref 15–41)
Albumin: 2.8 g/dL — ABNORMAL LOW (ref 3.5–5.0)
Alkaline Phosphatase: 115 U/L (ref 38–126)
Anion gap: 10 (ref 5–15)
BUN: 16 mg/dL (ref 6–20)
CO2: 23 mmol/L (ref 22–32)
Calcium: 8.8 mg/dL — ABNORMAL LOW (ref 8.9–10.3)
Chloride: 103 mmol/L (ref 98–111)
Creatinine, Ser: 0.91 mg/dL (ref 0.44–1.00)
GFR, Estimated: >60 mL/min (ref >60)
Glucose, Bld: 128 mg/dL — ABNORMAL HIGH (ref 70–99)
Potassium: 4.2 mmol/L (ref 3.5–5.1)
Sodium: 136 mmol/L (ref 135–145)
Total Bilirubin: 0.7 mg/dL (ref 0.3–1.2)
Total Protein: 6.3 g/dL — ABNORMAL LOW (ref 6.5–8.1)

## 2022-03-15 LAB — SAMPLE TO BLOOD BANK

## 2022-03-15 LAB — CBC WITH DIFFERENTIAL/PLATELET
Abs Immature Granulocytes: 0.05 10*3/uL (ref 0.00–0.07)
Basophils Absolute: 0 10*3/uL (ref 0.0–0.1)
Basophils Relative: 0 %
Eosinophils Absolute: 0 10*3/uL (ref 0.0–0.5)
Eosinophils Relative: 1 %
HCT: 27.4 % — ABNORMAL LOW (ref 36.0–46.0)
Hemoglobin: 8.4 g/dL — ABNORMAL LOW (ref 12.0–15.0)
Immature Granulocytes: 1 %
Lymphocytes Relative: 20 %
Lymphs Abs: 1.3 10*3/uL (ref 0.7–4.0)
MCH: 27.5 pg (ref 26.0–34.0)
MCHC: 30.7 g/dL (ref 30.0–36.0)
MCV: 89.8 fL (ref 80.0–100.0)
Monocytes Absolute: 1.1 10*3/uL — ABNORMAL HIGH (ref 0.1–1.0)
Monocytes Relative: 16 %
Neutro Abs: 4.1 10*3/uL (ref 1.7–7.7)
Neutrophils Relative %: 62 %
Platelets: 93 10*3/uL — ABNORMAL LOW (ref 150–400)
RBC: 3.05 MIL/uL — ABNORMAL LOW (ref 3.87–5.11)
RDW: 18.6 % — ABNORMAL HIGH (ref 11.5–15.5)
WBC: 6.6 10*3/uL (ref 4.0–10.5)
nRBC: 0.8 % — ABNORMAL HIGH (ref 0.0–0.2)

## 2022-03-15 LAB — MAGNESIUM: Magnesium: 1.6 mg/dL — ABNORMAL LOW (ref 1.7–2.4)

## 2022-03-15 MED ORDER — DIPHENHYDRAMINE HCL 25 MG PO CAPS
50.0000 mg | ORAL_CAPSULE | Freq: Once | ORAL | Status: AC
  Administered 2022-03-15: 50 mg via ORAL
  Filled 2022-03-15: qty 2

## 2022-03-15 MED ORDER — HEPARIN SOD (PORK) LOCK FLUSH 100 UNIT/ML IV SOLN
500.0000 [IU] | Freq: Once | INTRAVENOUS | Status: AC | PRN
  Administered 2022-03-15: 500 [IU]

## 2022-03-15 MED ORDER — SODIUM CHLORIDE 0.9 % IV SOLN: Freq: Once | INTRAVENOUS | Status: AC

## 2022-03-15 MED ORDER — DARATUMUMAB-HYALURONIDASE-FIHJ 1800-30000 MG-UT/15ML ~~LOC~~ SOLN
1800.0000 mg | Freq: Once | SUBCUTANEOUS | Status: AC
  Administered 2022-03-15: 1800 mg via SUBCUTANEOUS
  Filled 2022-03-15: qty 15

## 2022-03-15 MED ORDER — HEPARIN SOD (PORK) LOCK FLUSH 100 UNIT/ML IV SOLN: 250.0000 [IU] | Freq: Once | INTRAVENOUS | Status: DC | PRN

## 2022-03-15 MED ORDER — SODIUM CHLORIDE 0.9% FLUSH
10.0000 mL | INTRAVENOUS | Status: DC | PRN
  Administered 2022-03-15: 10 mL via INTRAVENOUS

## 2022-03-15 MED ORDER — ACETAMINOPHEN 325 MG PO TABS
650.0000 mg | ORAL_TABLET | Freq: Once | ORAL | Status: AC
  Administered 2022-03-15: 650 mg via ORAL
  Filled 2022-03-15: qty 2

## 2022-03-15 MED ORDER — SODIUM CHLORIDE 0.9 % IV SOLN
20.0000 mg | Freq: Once | INTRAVENOUS | Status: AC
  Administered 2022-03-15: 20 mg via INTRAVENOUS
  Filled 2022-03-15: qty 20

## 2022-03-15 MED ORDER — DEXTROSE 5 % IV SOLN
70.0000 mg/m2 | Freq: Once | INTRAVENOUS | Status: AC
  Administered 2022-03-15: 150 mg via INTRAVENOUS
  Filled 2022-03-15: qty 75

## 2022-03-15 NOTE — Progress Notes (Signed)
Patient presents today for chemotherapy treatment.  Patient is in satisfactory condition with no new complaints voiced.  Vital signs are stable.  Labs reviewed.  Platelets today are 93.  MD made aware.  Magnesium today is 1.6  Patient is refusing standing orders per Dr. Delton Coombes.  States that she will increase magnesium tablets at home.  All other labs are within treatment parameters.  We will proceed with treatment per MD orders.   Patient tolerated treatment well with no complaints voiced.  Patient left via wheelchair in stable condition.  Vital signs stable at discharge.  Follow up as scheduled.

## 2022-03-15 NOTE — Patient Instructions (Signed)
[facility]  Discharge Instructions: Thank you for choosing [facility] to provide your oncology and hematology care.  If you have a lab appointment with the Cancer Center, please come in thru the Main Entrance and check in at the main information desk.  Wear comfortable clothing and clothing appropriate for easy access to any Portacath or PICC line.   We strive to give you quality time with your provider. You may need to reschedule your appointment if you arrive late (15 or more minutes).  Arriving late affects you and other patients whose appointments are after yours.  Also, if you miss three or more appointments without notifying the office, you may be dismissed from the clinic at the provider's discretion.      For prescription refill requests, have your pharmacy contact our office and allow 72 hours for refills to be completed.     To help prevent nausea and vomiting after your treatment, we encourage you to take your nausea medication as directed.  BELOW ARE SYMPTOMS THAT SHOULD BE REPORTED IMMEDIATELY: *FEVER GREATER THAN 100.4 F (38 C) OR HIGHER *CHILLS OR SWEATING *NAUSEA AND VOMITING THAT IS NOT CONTROLLED WITH YOUR NAUSEA MEDICATION *UNUSUAL SHORTNESS OF BREATH *UNUSUAL BRUISING OR BLEEDING *URINARY PROBLEMS (pain or burning when urinating, or frequent urination) *BOWEL PROBLEMS (unusual diarrhea, constipation, pain near the anus) TENDERNESS IN MOUTH AND THROAT WITH OR WITHOUT PRESENCE OF ULCERS (sore throat, sores in mouth, or a toothache) UNUSUAL RASH, SWELLING OR PAIN  UNUSUAL VAGINAL DISCHARGE OR ITCHING   Items with * indicate a potential emergency and should be followed up as soon as possible or go to the Emergency Department if any problems should occur.  Please show the CHEMOTHERAPY ALERT CARD or IMMUNOTHERAPY ALERT CARD at check-in to the Emergency Department and triage nurse.  Should you have questions after your visit or need to  cancel or reschedule your appointment, please contact [facility] [phone]  and follow the prompts.  Office hours are 8:00 a.m. to 4:30 p.m. Monday - Friday. Please note that voicemails left after 4:00 p.m. may not be returned until the following business day.  We are closed weekends and major holidays. You have access to a nurse at all times for urgent questions. Please call the main number to the clinic [phone] and follow the prompts.  For any non-urgent questions, you may also contact your provider using MyChart. We now offer e-Visits for anyone 18 and older to request care online for non-urgent symptoms. For details visit [email or web address].   Also download the MyChart app! Go to the app store, search "MyChart", open the app, select [facility], and log in with your MyChart username and password.  Masks are optional in the cancer centers. If you would like for your care team to wear a mask while they are taking care of you, please let them know. You may have one support person who is at least 57 years old accompany you for your appointments.  

## 2022-03-15 NOTE — Progress Notes (Signed)
Ok to treat per MD today. Platelets 93, MD is aware.

## 2022-03-15 NOTE — Telephone Encounter (Signed)
Spoke to patient regarding changing antiemetic to scopolamine patches due to intolerance to phenergan.  Verbalized understanding.

## 2022-03-19 ENCOUNTER — Other Ambulatory Visit: Payer: Self-pay | Admitting: *Deleted

## 2022-03-19 MED ORDER — DARZALEX FASPRO 1800-30000 MG-UT/15ML ~~LOC~~ SOLN: 1800.0000 mg | SUBCUTANEOUS | 0 refills | Status: DC

## 2022-03-22 ENCOUNTER — Inpatient Hospital Stay: Payer: BC Managed Care – PPO

## 2022-03-22 ENCOUNTER — Other Ambulatory Visit (HOSPITAL_COMMUNITY): Payer: Self-pay | Admitting: Physician Assistant

## 2022-03-22 VITALS — BP 113/77 | HR 82 | Temp 99.3°F | Resp 20 | Wt 253.2 lb

## 2022-03-22 VITALS — BP 123/80 | HR 78 | Temp 97.7°F | Resp 19

## 2022-03-22 DIAGNOSIS — Z5112 Encounter for antineoplastic immunotherapy: Secondary | ICD-10-CM | POA: Diagnosis not present

## 2022-03-22 DIAGNOSIS — C9000 Multiple myeloma not having achieved remission: Secondary | ICD-10-CM

## 2022-03-22 DIAGNOSIS — Z95828 Presence of other vascular implants and grafts: Secondary | ICD-10-CM

## 2022-03-22 LAB — CBC WITH DIFFERENTIAL/PLATELET
Abs Immature Granulocytes: 0.04 10*3/uL (ref 0.00–0.07)
Basophils Absolute: 0 10*3/uL (ref 0.0–0.1)
Basophils Relative: 0 %
Eosinophils Absolute: 0.1 10*3/uL (ref 0.0–0.5)
Eosinophils Relative: 1 %
HCT: 25.2 % — ABNORMAL LOW (ref 36.0–46.0)
Hemoglobin: 7.7 g/dL — ABNORMAL LOW (ref 12.0–15.0)
Immature Granulocytes: 1 %
Lymphocytes Relative: 20 %
Lymphs Abs: 1.1 10*3/uL (ref 0.7–4.0)
MCH: 27.7 pg (ref 26.0–34.0)
MCHC: 30.6 g/dL (ref 30.0–36.0)
MCV: 90.6 fL (ref 80.0–100.0)
Monocytes Absolute: 1 10*3/uL (ref 0.1–1.0)
Monocytes Relative: 18 %
Neutro Abs: 3.3 10*3/uL (ref 1.7–7.7)
Neutrophils Relative %: 60 %
Platelets: 91 10*3/uL — ABNORMAL LOW (ref 150–400)
RBC: 2.78 MIL/uL — ABNORMAL LOW (ref 3.87–5.11)
RDW: 19.2 % — ABNORMAL HIGH (ref 11.5–15.5)
WBC: 5.4 10*3/uL (ref 4.0–10.5)
nRBC: 0.9 % — ABNORMAL HIGH (ref 0.0–0.2)

## 2022-03-22 LAB — COMPREHENSIVE METABOLIC PANEL
ALT: 16 U/L (ref 0–44)
AST: 22 U/L (ref 15–41)
Albumin: 2.7 g/dL — ABNORMAL LOW (ref 3.5–5.0)
Alkaline Phosphatase: 119 U/L (ref 38–126)
Anion gap: 9 (ref 5–15)
BUN: 20 mg/dL (ref 6–20)
CO2: 23 mmol/L (ref 22–32)
Calcium: 8.1 mg/dL — ABNORMAL LOW (ref 8.9–10.3)
Chloride: 104 mmol/L (ref 98–111)
Creatinine, Ser: 1.04 mg/dL — ABNORMAL HIGH (ref 0.44–1.00)
GFR, Estimated: >60 mL/min (ref >60)
Glucose, Bld: 160 mg/dL — ABNORMAL HIGH (ref 70–99)
Potassium: 3.7 mmol/L (ref 3.5–5.1)
Sodium: 136 mmol/L (ref 135–145)
Total Bilirubin: 0.8 mg/dL (ref 0.3–1.2)
Total Protein: 6.1 g/dL — ABNORMAL LOW (ref 6.5–8.1)

## 2022-03-22 LAB — SAMPLE TO BLOOD BANK

## 2022-03-22 LAB — MAGNESIUM: Magnesium: 1.6 mg/dL — ABNORMAL LOW (ref 1.7–2.4)

## 2022-03-22 LAB — PREPARE RBC (CROSSMATCH)

## 2022-03-22 MED ORDER — ACETAMINOPHEN 325 MG PO TABS
650.0000 mg | ORAL_TABLET | Freq: Once | ORAL | Status: AC
  Administered 2022-03-22: 650 mg via ORAL
  Filled 2022-03-22: qty 2

## 2022-03-22 MED ORDER — MAGNESIUM OXIDE 400 MG PO TABS
400.0000 mg | ORAL_TABLET | Freq: Once | ORAL | Status: AC
  Administered 2022-03-22: 400 mg via ORAL
  Filled 2022-03-22: qty 1

## 2022-03-22 MED ORDER — SODIUM CHLORIDE 0.9 % IV SOLN: Freq: Once | INTRAVENOUS | Status: AC

## 2022-03-22 MED ORDER — DEXTROSE 5 % IV SOLN
70.0000 mg/m2 | Freq: Once | INTRAVENOUS | Status: AC
  Administered 2022-03-22: 150 mg via INTRAVENOUS
  Filled 2022-03-22: qty 75

## 2022-03-22 MED ORDER — SODIUM CHLORIDE 0.9% FLUSH
10.0000 mL | INTRAVENOUS | Status: DC | PRN
  Administered 2022-03-22: 10 mL

## 2022-03-22 MED ORDER — ZOLEDRONIC ACID 4 MG/5ML IV CONC
3.0000 mg | Freq: Once | INTRAVENOUS | Status: AC
  Administered 2022-03-22: 3 mg via INTRAVENOUS
  Filled 2022-03-22: qty 3.75

## 2022-03-22 MED ORDER — DARATUMUMAB-HYALURONIDASE-FIHJ 1800-30000 MG-UT/15ML ~~LOC~~ SOLN
1800.0000 mg | Freq: Once | SUBCUTANEOUS | Status: AC
  Administered 2022-03-22: 1800 mg via SUBCUTANEOUS
  Filled 2022-03-22: qty 15

## 2022-03-22 MED ORDER — LORAZEPAM 0.5 MG PO TABS: 0.5000 mg | ORAL_TABLET | Freq: Three times a day (TID) | ORAL | 0 refills | Status: DC

## 2022-03-22 MED ORDER — DIPHENHYDRAMINE HCL 25 MG PO CAPS
50.0000 mg | ORAL_CAPSULE | Freq: Once | ORAL | Status: AC
  Administered 2022-03-22: 50 mg via ORAL
  Filled 2022-03-22: qty 2

## 2022-03-22 MED ORDER — HEPARIN SOD (PORK) LOCK FLUSH 100 UNIT/ML IV SOLN
500.0000 [IU] | Freq: Once | INTRAVENOUS | Status: AC | PRN
  Administered 2022-03-22: 500 [IU]

## 2022-03-22 MED ORDER — SODIUM CHLORIDE 0.9 % IV SOLN
20.0000 mg | Freq: Once | INTRAVENOUS | Status: AC
  Administered 2022-03-22: 20 mg via INTRAVENOUS
  Filled 2022-03-22: qty 2

## 2022-03-22 NOTE — Patient Instructions (Signed)
[facility]  Discharge Instructions: Thank you for choosing [facility] to provide your oncology and hematology care.  If you have a lab appointment with the [facility], please come in thru the Main Entrance and check in at the main information desk.  Wear comfortable clothing and clothing appropriate for easy access to any Portacath or PICC line.   We strive to give you quality time with your provider. You may need to reschedule your appointment if you arrive late (15 or more minutes).  Arriving late affects you and other patients whose appointments are after yours.  Also, if you miss three or more appointments without notifying the office, you may be dismissed from the clinic at the provider's discretion.      For prescription refill requests, have your pharmacy contact our office and allow 72 hours for refills to be completed.    Today you received the following chemotherapy and/or immunotherapy agents Kyprolsis, Zometa, and Daratumumab, return as scheduled.   To help prevent nausea and vomiting after your treatment, we encourage you to take your nausea medication as directed.  BELOW ARE SYMPTOMS THAT SHOULD BE REPORTED IMMEDIATELY: *FEVER GREATER THAN 100.4 F (38 C) OR HIGHER *CHILLS OR SWEATING *NAUSEA AND VOMITING THAT IS NOT CONTROLLED WITH YOUR NAUSEA MEDICATION *UNUSUAL SHORTNESS OF BREATH *UNUSUAL BRUISING OR BLEEDING *URINARY PROBLEMS (pain or burning when urinating, or frequent urination) *BOWEL PROBLEMS (unusual diarrhea, constipation, pain near the anus) TENDERNESS IN MOUTH AND THROAT WITH OR WITHOUT PRESENCE OF ULCERS (sore throat, sores in mouth, or a toothache) UNUSUAL RASH, SWELLING OR PAIN  UNUSUAL VAGINAL DISCHARGE OR ITCHING   Items with * indicate a potential emergency and should be followed up as soon as possible or go to the Emergency Department if any problems should occur.  Please show the CHEMOTHERAPY ALERT CARD or  IMMUNOTHERAPY ALERT CARD at check-in to the Emergency Department and triage nurse.  Should you have questions after your visit or need to cancel or reschedule your appointment, please contact [facility] [phone]  and follow the prompts.  Office hours are 8:00 a.m. to 4:30 p.m. Monday - Friday. Please note that voicemails left after 4:00 p.m. may not be returned until the following business day.  We are closed weekends and major holidays. You have access to a nurse at all times for urgent questions. Please call the main number to the clinic [phone] and follow the prompts.  For any non-urgent questions, you may also contact your provider using MyChart. We now offer e-Visits for anyone 32 and older to request care online for non-urgent symptoms. For details visit [email or web address].   Also download the MyChart app! Go to the app store, search "MyChart", open the app, select [facility], and log in with your MyChart username and password.  Masks are optional in the cancer centers. If you would like for your care team to wear a mask while they are taking care of you, please let them know. You may have one support person who is at least 57 years old accompany you for your appointments.

## 2022-03-22 NOTE — Progress Notes (Signed)
Patient reports scopolamine patch is not improving nausea. Per cardiology, unable to take compazine and valium due to risk of QTC prolongation but okay with Ativan. Sent prescription for Ativan 0.5 mg q 8 hours PRN for nausea.

## 2022-03-22 NOTE — Progress Notes (Signed)
Patients port flushed without difficulty.  Good blood return noted with no bruising or swelling noted at site.  Patient c/o pain at port site.  Dede Query, PA-C notified and evaluated the area.  She suggested proceeding with labs/treatment and obtaining a CXR today before patient left.  Order was placed.  Patient remains accessed for possible treatment.

## 2022-03-22 NOTE — Progress Notes (Addendum)
Patient presents today for Dara, Zometa, and Kyprolsis, Hemoglobin 7.7 per standing orders patient will receive 1 unit of blood tomorrow. Magnesium 1.6, '400mg'$  of magnesium oxide ordered. Okay for treatment per Dede Query, PA.Patient tolerated Daratumumab injection with no complaints voiced. See MAR for details. Lab reviewed. Injection site clean and dry with no bruising or swelling noted at site. Band aid applied.  Patient tolerated Zometa and chemotherapy with no complaints voiced. Side effects with management reviewed understanding verbalized. Port site clean and dry with no bruising or swelling noted at site. Good blood return noted before and after administration of chemotherapy. Band aid applied. Patient left in satisfactory condition with VSS and no s/s of distress noted.  Ativan prescription sent into patient's pharmacy by Dede Query, PA, for nausea, patient made aware

## 2022-03-23 ENCOUNTER — Telehealth: Payer: Self-pay | Admitting: Hematology

## 2022-03-23 ENCOUNTER — Inpatient Hospital Stay: Payer: BC Managed Care – PPO

## 2022-03-23 VITALS — BP 126/81 | HR 83 | Temp 97.6°F | Resp 18

## 2022-03-23 DIAGNOSIS — C9000 Multiple myeloma not having achieved remission: Secondary | ICD-10-CM

## 2022-03-23 DIAGNOSIS — Z5112 Encounter for antineoplastic immunotherapy: Secondary | ICD-10-CM | POA: Diagnosis not present

## 2022-03-23 MED ORDER — SODIUM CHLORIDE 0.9% FLUSH
10.0000 mL | INTRAVENOUS | Status: AC | PRN
  Administered 2022-03-23: 10 mL

## 2022-03-23 MED ORDER — HEPARIN SOD (PORK) LOCK FLUSH 100 UNIT/ML IV SOLN
500.0000 [IU] | Freq: Every day | INTRAVENOUS | Status: AC | PRN
  Administered 2022-03-23: 500 [IU]

## 2022-03-23 MED ORDER — SODIUM CHLORIDE 0.9% IV SOLUTION
250.0000 mL | Freq: Once | INTRAVENOUS | Status: AC
  Administered 2022-03-23: 250 mL via INTRAVENOUS

## 2022-03-23 NOTE — Telephone Encounter (Signed)
Pc to [facility] spoke with David(pharmacist). They need a new script for Darzalex for weeks 9-24. Needs to be for every 2 weeks and request 8 does. Sent request to RN's.

## 2022-03-23 NOTE — Patient Instructions (Signed)
[facility]  Discharge Instructions: Thank you for choosing [facility] to provide your oncology and hematology care.  If you have a lab appointment with the Cancer Center, please come in thru the Main Entrance and check in at the main information desk.  Wear comfortable clothing and clothing appropriate for easy access to any Portacath or PICC line.   We strive to give you quality time with your provider. You may need to reschedule your appointment if you arrive late (15 or more minutes).  Arriving late affects you and other patients whose appointments are after yours.  Also, if you miss three or more appointments without notifying the office, you may be dismissed from the clinic at the provider's discretion.      For prescription refill requests, have your pharmacy contact our office and allow 72 hours for refills to be completed.    Today you received the following 1 unit of PRBCs, return as scheduled.   To help prevent nausea and vomiting after your treatment, we encourage you to take your nausea medication as directed.  BELOW ARE SYMPTOMS THAT SHOULD BE REPORTED IMMEDIATELY: *FEVER GREATER THAN 100.4 F (38 C) OR HIGHER *CHILLS OR SWEATING *NAUSEA AND VOMITING THAT IS NOT CONTROLLED WITH YOUR NAUSEA MEDICATION *UNUSUAL SHORTNESS OF BREATH *UNUSUAL BRUISING OR BLEEDING *URINARY PROBLEMS (pain or burning when urinating, or frequent urination) *BOWEL PROBLEMS (unusual diarrhea, constipation, pain near the anus) TENDERNESS IN MOUTH AND THROAT WITH OR WITHOUT PRESENCE OF ULCERS (sore throat, sores in mouth, or a toothache) UNUSUAL RASH, SWELLING OR PAIN  UNUSUAL VAGINAL DISCHARGE OR ITCHING   Items with * indicate a potential emergency and should be followed up as soon as possible or go to the Emergency Department if any problems should occur.  Please show the CHEMOTHERAPY ALERT CARD or IMMUNOTHERAPY ALERT CARD at check-in to the Emergency Department  and triage nurse.  Should you have questions after your visit or need to cancel or reschedule your appointment, please contact [facility] [phone]  and follow the prompts.  Office hours are 8:00 a.m. to 4:30 p.m. Monday - Friday. Please note that voicemails left after 4:00 p.m. may not be returned until the following business day.  We are closed weekends and major holidays. You have access to a nurse at all times for urgent questions. Please call the main number to the clinic [phone] and follow the prompts.  For any non-urgent questions, you may also contact your provider using MyChart. We now offer e-Visits for anyone 18 and older to request care online for non-urgent symptoms. For details visit [email or web address].   Also download the MyChart app! Go to the app store, search "MyChart", open the app, select [facility], and log in with your MyChart username and password.  Masks are optional in the cancer centers. If you would like for your care team to wear a mask while they are taking care of you, please let them know. You may have one support person who is at least 57 years old accompany you for your appointments.  

## 2022-03-23 NOTE — Progress Notes (Signed)
Patient presents today for 1 unit of PRBCs. Patient tolerated blood transfusion with no complaints voiced. Side effects with management reviewed with understanding verbalized. Port site clean and dry with no bruising or swelling noted at site. Good blood return noted before and after administration of therapy. Band aid applied. Patient left in satisfactory condition with VSS and no s/s of distress noted.

## 2022-03-26 ENCOUNTER — Other Ambulatory Visit: Payer: Self-pay

## 2022-03-26 ENCOUNTER — Ambulatory Visit
Admission: RE | Admit: 2022-03-26 | Discharge: 2022-03-26 | Disposition: A | Discharge status: Home / Self Care | Payer: BC Managed Care – PPO | Source: Ambulatory Visit | Attending: Family Medicine | Admitting: Family Medicine

## 2022-03-26 VITALS — BP 167/110 | HR 79 | Temp 99.3°F | Resp 20

## 2022-03-26 DIAGNOSIS — M79602 Pain in left arm: Secondary | ICD-10-CM

## 2022-03-26 DIAGNOSIS — M25812 Other specified joint disorders, left shoulder: Secondary | ICD-10-CM

## 2022-03-26 DIAGNOSIS — M25819 Other specified joint disorders, unspecified shoulder: Secondary | ICD-10-CM | POA: Diagnosis present

## 2022-03-26 LAB — BPAM RBC
Blood Product Expiration Date: 202310022359
Blood Product Expiration Date: 202310022359
ISSUE DATE / TIME: 202309151002
ISSUE DATE / TIME: 202309151002
Unit Type and Rh: 6200
Unit Type and Rh: 6200

## 2022-03-26 LAB — TYPE AND SCREEN
ABO/RH(D): A POS
Antibody Screen: POSITIVE
Donor AG Type: NEGATIVE
Unit division: 0
Unit division: 0

## 2022-03-26 MED ORDER — CYCLOBENZAPRINE HCL 5 MG PO TABS: 5.0000 mg | ORAL_TABLET | Freq: Three times a day (TID) | ORAL | 0 refills | Status: DC | PRN

## 2022-03-26 MED ORDER — OXYCODONE-ACETAMINOPHEN 5-325 MG PO TABS: 1.0000 | ORAL_TABLET | Freq: Two times a day (BID) | ORAL | 0 refills | Status: DC | PRN

## 2022-03-26 MED ORDER — METHYLPREDNISOLONE SODIUM SUCC 125 MG IJ SOLR
60.0000 mg | Freq: Once | INTRAMUSCULAR | Status: AC
  Administered 2022-03-26: 60 mg via INTRAMUSCULAR

## 2022-03-26 NOTE — ED Provider Notes (Signed)
[facility]    CSN: [id number] Arrival date & time: 03/26/22  1324      History   Chief Complaint Chief Complaint  Patient presents with   Arm Pain    HPI Joanna Reid is a 56 y.o. female.   Patient presenting today with 4-day history of severe left arm pain radiating down toward her elbow from her shoulder causing some numbness and tingling into her hand.  Pain significantly worse with movement of the arm, states she cannot even put a jug of tea in the fridge. She denies any injury prior to onset of symptoms.  Had a very similar issue several months ago to the right side that was improved with steroids and oxycodone.  She states she had some oxycodone left from this episode took the last 1 or 2 of those over the last few days with only mild temporary relief of symptoms.  She got a steroid injection at her oncologist on Friday which also lessen the symptoms temporarily but states once it wore off the symptoms returned.  Denies chest pain, shortness of breath, palpitations, dizziness, arm discoloration, swelling, weakness.  She notes that she has an orthopedic appointment tomorrow for this issue but could not wait.    Past Medical History:  Diagnosis Date   Acid reflux    Allergic rhinitis    Cancer ([place])    multiple myeloma   Diabetes mellitus    type 2   Gout    Gout    H/o COVID-19--- was Positive 09/17/2019, Negative 12/23/19 AND also Neg on  12/26/19 12/26/2019   HBP (high blood pressure)    History of kidney stones    Migraines     Patient Active Problem List   Diagnosis Date Noted   Subclinical hypothyroidism 02/05/2022   Encounter for general adult medical examination with abnormal findings 02/05/2022   Vitamin D deficiency 02/05/2022   Overactive bladder 11/30/2021   Hordeolum internum of left lower eyelid 08/07/2021   Drug-induced polyneuropathy ([place]) 04/05/2021   History of CVA with residual deficit 04/05/2021   Compression fracture of L1 lumbar  vertebra (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  12/26/2019   Hyperlipidemia 08/09/2018   Anemia 08/09/2018   Leukopenia 08/09/2018   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   S/P autologous bone marrow transplantation ([place]) 04/01/2018   Abnormal echocardiogram 02/04/2018   LVH (left ventricular hypertrophy) 02/04/2018   Morbid obesity with BMI of 45.0-49.9, adult ([place]) 02/04/2018   Multiple myeloma ([place]) 10/29/2017   Hypercalcemia 10/29/2017   Primary osteoarthritis of right knee 09/20/2017   Chronic pain disorder 03/21/2017   Spondylosis of lumbar spine 03/21/2017   Thoracic spondylosis without myelopathy 03/21/2017   Type 2 diabetes mellitus with other specified complication ([place]) 00/93/8182   Anxiety as acute reaction to exceptional stress 01/13/2015   Attention deficit hyperactivity disorder (ADHD), 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   Gout 01/20/2014   GERD (gastroesophageal reflux disease) 01/20/2014   Essential hypertension, benign 01/20/2014   Plantar fasciitis of left foot 09/19/2011    Past Surgical History:  Procedure Laterality Date   BREAST CYST EXCISION Left    2009 no visible scar on skin   CESAREAN SECTION     COLONOSCOPY WITH PROPOFOL N/A 12/25/2019   Procedure: COLONOSCOPY WITH PROPOFOL;  Surgeon: Rogene Houston, MD;  [facility];  Service: Endoscopy;  Laterality: N/A;  [place] LITHOTRIPSY Left 10/10/2017  Procedure: LEFT EXTRACORPOREAL SHOCK WAVE LITHOTRIPSY (ESWL);  Surgeon: Bjorn Loser, MD;  Location: [facility];  Service: Urology;  Laterality: Left;   EYE SURGERY     HEMORRHOID SURGERY N/A 11/19/2012   Procedure: HEMORRHOIDECTOMY;  Surgeon: Jamesetta So, MD;  Location: [facility];  Service: General;  Laterality: N/A;   IR IMAGING GUIDED PORT INSERTION  01/18/2022   kidney stones  1998   LAPAROSCOPIC UNILATERAL SALPINGO OOPHERECTOMY  05/14/2012   Procedure: LAPAROSCOPIC  UNILATERAL SALPINGO OOPHORECTOMY;  Surgeon: [name], MD;  Location: [facility];  Service: Gynecology;  Laterality: Right;  laparoscopic right salpingo-oophorectomy   PARTIAL HYSTERECTOMY     POLYPECTOMY  12/25/2019   Procedure: POLYPECTOMY;  Surgeon: Rogene Houston, MD;  [facility]: [facility];  Service: Endoscopy;;   TONSILECTOMY, ADENOIDECTOMY, BILATERAL MYRINGOTOMY AND TUBES     VESICOVAGINAL FISTULA CLOSURE W/ TAH      OB History     Gravida  2   Para  1   Term      Preterm  1   AB  1   Living  1      SAB  1   IAB      Ectopic      Multiple      Live Births  1            Home Medications    Prior to Admission medications   Medication Sig Start Date End Date Taking? Authorizing Provider  acyclovir (ZOVIRAX) 400 MG tablet TAKE (1) TABLET BY MOUTH TWICE DAILY. 03/01/22  Yes Derek Jack, MD  albuterol (VENTOLIN HFA) 108 (90 Base) MCG/ACT inhaler Inhale 2 puffs into the lungs every 4 (four) hours as needed for wheezing or shortness of breath. 11/30/20  Yes Lovena Le, Malena M, DO  allopurinol (ZYLOPRIM) 300 MG tablet TAKE (1) TABLET BY MOUTH ONCE DAILY. 01/23/22  Yes Lindell Spar, MD  amLODipine (NORVASC) 2.5 MG tablet Take 2.5 mg by mouth daily. 01/27/21  Yes [provider]  aspirin EC 81 MG tablet Take 1 tablet (81 mg total) by mouth daily. 12/26/19  Yes Rehman, Mechele Dawley, MD  BD PEN NEEDLE NANO U/F 32G X 4 MM MISC Inject into the skin. 01/31/22  Yes [provider]  bumetanide (BUMEX) 0.5 MG tablet Take 0.5 mg by mouth daily.  06/30/19  Yes [provider]  carvedilol (COREG) 25 MG tablet Take 1 tablet (25 mg total) by mouth 2 (two) times daily. 12/29/19  Yes Thurnell Lose, MD  cyclobenzaprine (FLEXERIL) 5 MG tablet Take 1 tablet (5 mg total) by mouth 3 (three) times daily as needed for muscle spasms. Do not drink alcohol or drive while taking this medication.  May cause drowsiness. 03/26/22  Yes Volney American, PA-C   DARZALEX FASPRO 1800-30000 MG-UT/15ML SOLN Inject 15 mLs (1,800 mg total) into the skin once a week for 4 doses. 03/19/22 04/10/22 Yes Derek Jack, MD  diclofenac Sodium (VOLTAREN) 1 % GEL Apply 2 g topically 4 (four) times daily. 05/01/21  Yes Caccavale, Sophia, PA-C  dicyclomine (BENTYL) 20 MG tablet Take 1 tablet (20 mg total) by mouth 2 (two) times daily as needed for spasms. 05/01/21  Yes Caccavale, Sophia, PA-C  Docosanol 10 % CREA Apply 1 application topically daily as needed (for nose).    Yes [provider]  docusate sodium (COLACE) 100 MG capsule Take 100 mg by mouth daily.    Yes [provider]  Dulaglutide 1.5 MG/0.5ML SOPN Inject  1.5 mg into the skin every 7 (seven) days.   Yes [provider]  erythromycin ophthalmic ointment Place 1 application into the left eye at bedtime. 08/07/21  Yes Lindell Spar, MD  escitalopram (LEXAPRO) 10 MG tablet TAKE (1) TABLET BY MOUTH ONCE DAILY. MAY START WITH 1/2 TABLET FOR 7 DAYS. 01/23/22  Yes Lindell Spar, MD  fluticasone [facility]) 50 MCG/ACT nasal spray INSTILL 2 SPRAYS INTO BOTH NOSTRILS DAILY 06/28/21  Yes Lindell Spar, MD  Insulin Lispro w/ Trans Port 100 UNIT/ML SOPN Inject before meals per sliding scale. Max TDD 20 units 01/31/22  Yes [provider]  KYPROLIS 30 MG SOLR Inject into the vein. 01/29/22  Yes [provider]  KYPROLIS 60 MG SOLR INFUSE 40MG INTRAVENOUSLY FOR 1 DOSE 02/06/22  Yes Derek Jack, MD  Lactulose 20 GM/30ML SOLN Take 30 mLs (20 g total) by mouth daily. 09/27/21  Yes Derek Jack, MD  lidocaine-prilocaine (EMLA) cream Apply 1 Application topically as needed (Apply to port prior to treatment and flushes). 01/24/22  Yes Derek Jack, MD  LORazepam (ATIVAN) 0.5 MG tablet Take 1 tablet (0.5 mg total) by mouth every 8 (eight) hours. 03/22/22  Yes Dede Query T, PA-C  losartan (COZAAR) 100 MG tablet Take 100 mg by mouth daily.   Yes [provider]  magnesium oxide (MAG-OX) 400 (241.3 Mg) MG tablet Take 1 tablet (400 mg total) by mouth 3 (three) times daily. 10/02/18  Yes Derek Jack, MD  nitrofurantoin, macrocrystal-monohydrate, (MACROBID) 100 MG capsule TAKE (1) CAPSULE BY MOUTH AT BEDTIME. 03/14/22  Yes Derek Jack, MD  NOVOLOG FLEXPEN 100 UNIT/ML FlexPen Inject into the skin. 01/31/22  Yes [provider]  oxyCODONE-acetaminophen (PERCOCET/ROXICET) 5-325 MG tablet Take 1 tablet by mouth 2 (two) times daily as needed for severe pain. 03/26/22  Yes Volney American, PA-C  pantoprazole (PROTONIX) 40 MG tablet TAKE (1) TABLET BY MOUTH ONCE DAILY. 01/17/22  Yes Lindell Spar, MD  pregabalin (LYRICA) 200 MG capsule TAKE (1) CAPSULE BY MOUTH TWICE DAILY. 01/25/22  Yes Derek Jack, MD  Propylene Glycol (SYSTANE BALANCE) 0.6 % SOLN Apply 1 drop to eye daily as needed (dry eye).    Yes [provider]  rosuvastatin (CRESTOR) 5 MG tablet Take 5 mg by mouth daily. 10/17/20  Yes [provider]  scopolamine (TRANSDERM-SCOP) 1 MG/3DAYS Place 1 patch (1.5 mg total) onto the skin every 3 (three) days. 03/14/22  Yes Derek Jack, MD  valACYclovir (VALTREX) 1000 MG tablet TAKE 2 TABLETS BY MOUTH NOW; THEN 2 12 HOURS LATER. 12/19/21  Yes Derek Jack, MD  VELCADE 3.5 MG injection RECONSTITUTE EACH VIAL AS DIRECTED. INJECT 2.5 MG UNDER THE SKIN EVERY 14 DAYS. 01/22/22  Yes Derek Jack, MD  metFORMIN (GLUCOPHAGE) 500 MG tablet Take 500 mg by mouth 2 (two) times daily. 11/16/21 03/01/22  [provider]    Family History Family History  Problem Relation Age of Onset   Arthritis Other    Cancer Other    Diabetes Other    Hypertension Mother    Dementia Mother    Diabetes Father    ALS Father    Diabetes Brother    Hypertension Brother    Cancer Paternal Aunt    COPD Maternal Grandmother    Cancer Maternal Grandfather    Anesthesia problems Paternal  Grandfather     Social History Social History   Tobacco Use   Smoking status: Former    Types: Cigarettes  Quit date: 02/27/1999    Years since quitting: 23.0   Smokeless tobacco: Never   Tobacco comments:    socially   Vaping Use   Vaping Use: Never used  Substance Use Topics   Alcohol use: No    Alcohol/week: 0.0 standard drinks of alcohol   Drug use: No     Allergies   Patient has no known allergies.   Review of Systems Review of Systems Per HPI  Physical Exam Triage Vital Signs ED Triage Vitals  Enc Vitals Group     BP 03/26/22 1359 (!) 167/110     Pulse Rate 03/26/22 1359 79     Resp 03/26/22 1359 20     Temp 03/26/22 1359 99.3 F (37.4 C)     Temp Source 03/26/22 1359 Oral     SpO2 03/26/22 1359 97 %     Weight --      Height --      Head Circumference --      Peak Flow --      Pain Score 03/26/22 1358 10     Pain Loc --      Pain Edu? --      Excl. in [place]? --    No data found.  Updated Vital Signs BP (!) 167/110 (BP Location: Right Arm)   Pulse 79   Temp 99.3 F (37.4 C) (Oral)   Resp 20   SpO2 97%   Visual Acuity Right Eye Distance:   Left Eye Distance:   Bilateral Distance:    Right Eye Near:   Left Eye Near:    Bilateral Near:     Physical Exam Vitals and nursing note reviewed.  Constitutional:      Appearance: Normal appearance. She is not ill-appearing.  HENT:     Head: Atraumatic.  Eyes:     Extraocular Movements: Extraocular movements intact.     Conjunctiva/sclera: Conjunctivae normal.  Cardiovascular:     Rate and Rhythm: Normal rate and regular rhythm.     Heart sounds: Normal heart sounds.  Pulmonary:     Effort: Pulmonary effort is normal.     Breath sounds: Normal breath sounds.  Musculoskeletal:        General: Tenderness present. No swelling or signs of injury. Normal range of motion.     Cervical back: Normal range of motion and neck supple.     Comments: Range of motion of the left upper extremity intact  but significantly painful.  Mild diffuse tenderness to palpation throughout upper arm and shoulder region and to left lateral upper back musculature.  Grip strength intact bilateral hands  Skin:    General: Skin is warm and dry.     Findings: No bruising or erythema.  Neurological:     Mental Status: She is alert and oriented to person, place, and time.     Comments: Left upper extremity neurovascularly intact  Psychiatric:        Mood and Affect: Mood normal.        Thought Content: Thought content normal.        Judgment: Judgment normal.      UC Treatments / Results  Labs (all labs ordered are listed, but only abnormal results are displayed) Labs Reviewed - No data to display  EKG   Radiology No results found.  Procedures Procedures (including critical care time)  Medications Ordered in UC Medications  methylPREDNISolone sodium succinate (SOLU-MEDROL) 125 mg/2 mL injection 60 mg (60 mg Intramuscular Given 03/26/22  1427)    Initial Impression / Assessment and Plan / UC Course  I have reviewed the triage vital signs and the nursing notes.  Pertinent labs & imaging results that were available during my care of the patient were reviewed by me and considered in my medical decision making (see chart for details).     Suspect some shoulder impingement/radicular pain down the left arm.  EKG today revealing normal sinus rhythm at 76 bpm with right bundle blanch block but no ST or T wave changes.  This was consistent with previous EKG reviewed in chart.  We will treat with dose of IM Solu-Medrol, Flexeril, very small supply of oxycodone for as needed use for severe pain.  She has an orthopedic appointment tomorrow for follow-up and knows to go the emergency department if her symptoms significantly worsen at any time.  Final Clinical Impressions(s) / UC Diagnoses   Final diagnoses:  Shoulder impingement  Left arm pain   Discharge Instructions   None    ED Prescriptions      Medication Sig Dispense Auth. Provider   cyclobenzaprine (FLEXERIL) 5 MG tablet Take 1 tablet (5 mg total) by mouth 3 (three) times daily as needed for muscle spasms. Do not drink alcohol or drive while taking this medication.  May cause drowsiness. 15 tablet Volney American, [place]   oxyCODONE-acetaminophen (PERCOCET/ROXICET) 5-325 MG tablet Take 1 tablet by mouth 2 (two) times daily as needed for severe pain. 10 tablet Volney American, [place]      I have reviewed the PDMP during this encounter.   Volney American, [place] 03/26/22 1434

## 2022-03-26 NOTE — ED Triage Notes (Signed)
Pt states that her left arm is hurting from shoulder to hand. Her hand is feeling numb. She is having nausea from the pain. She did get a steroid injection at the cancer center Friday and it helped but now all the pain back . She had some old oxy pain meds but she is out so she is taking Tylenol as needed.   She has an ortho appt tomorrow.

## 2022-03-27 ENCOUNTER — Ambulatory Visit (INDEPENDENT_AMBULATORY_CARE_PROVIDER_SITE_OTHER): Payer: BC Managed Care – PPO | Admitting: Orthopaedic Surgery

## 2022-03-27 ENCOUNTER — Ambulatory Visit (INDEPENDENT_AMBULATORY_CARE_PROVIDER_SITE_OTHER): Payer: BC Managed Care – PPO

## 2022-03-27 ENCOUNTER — Other Ambulatory Visit: Payer: Self-pay | Admitting: *Deleted

## 2022-03-27 ENCOUNTER — Encounter: Payer: Self-pay | Admitting: Orthopaedic Surgery

## 2022-03-27 VITALS — BP 179/104 | HR 95 | Ht 64.0 in | Wt 255.0 lb

## 2022-03-27 DIAGNOSIS — M79602 Pain in left arm: Secondary | ICD-10-CM | POA: Diagnosis not present

## 2022-03-27 DIAGNOSIS — C9000 Multiple myeloma not having achieved remission: Secondary | ICD-10-CM

## 2022-03-27 MED ORDER — DARZALEX FASPRO 1800-30000 MG-UT/15ML ~~LOC~~ SOLN: SUBCUTANEOUS | 0 refills | Status: DC

## 2022-03-27 MED ORDER — OXYCODONE-ACETAMINOPHEN 5-325 MG PO TABS: ORAL_TABLET | ORAL | 0 refills | Status: DC

## 2022-03-27 NOTE — Telephone Encounter (Signed)
Prescription for Darzalex faspro 1800 - 30000 mg -UT/75m :  Inject 17mover 3-5 minutes subcutaneously once every 2 weeks for 12027mo refills sent to [facility] per current orders.

## 2022-03-27 NOTE — Progress Notes (Signed)
I have pain down my left arm to my fingers.  She started having marked pain down the left arm from the shoulder to her forearm to her fingers.  Pain was intense at times.  It made her nauseous.  She has history of multiple myeloma.  She has recently had steroid injection and is on Percocet.  That helps.   She is not improving.  She cannot rest well.  Exam shows weak left upper arm and triceps of 3 of 5.  Decreased reflexes left.  Grips weak left.  ROM good.  No redness, no swelling.  X-rays were done of cervical spine, reported separately.  Encounter Diagnoses  Name Primary?   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  I will get MRI with and without contrast of cervical spine.  Return in two weeks.  Use sling left.  I have reviewed the [place] web site prior to prescribing narcotic medicine for this patient.  Call if any problem.  Precautions discussed.  Electronically Signed Sanjuana Kava, MD 9/19/202311:40 AM

## 2022-03-27 NOTE — Patient Instructions (Signed)
While we are working on your approval please go ahead and call to schedule your appointment with [facility] in at least 2 weeks.    Central Scheduling [id number]  AFTER you have made your imaging appointment, please call our office back at [phone] to schedule an appointment to review your results. Your follow up appointment will need to be 3-4 days after your imaging is performed to allow [name] time to get the report. If you are having your imaging performed in a facility not associated with [facility], you will need to ask for a CD with your images on them.

## 2022-03-29 ENCOUNTER — Inpatient Hospital Stay: Payer: BC Managed Care – PPO

## 2022-03-29 ENCOUNTER — Encounter (HOSPITAL_COMMUNITY): Payer: Self-pay | Admitting: Physician Assistant

## 2022-03-29 ENCOUNTER — Other Ambulatory Visit: Payer: Self-pay

## 2022-03-29 ENCOUNTER — Emergency Department (HOSPITAL_COMMUNITY): Payer: BC Managed Care – PPO

## 2022-03-29 ENCOUNTER — Other Ambulatory Visit (HOSPITAL_COMMUNITY): Payer: Self-pay | Admitting: Physician Assistant

## 2022-03-29 ENCOUNTER — Emergency Department (HOSPITAL_COMMUNITY)
Admission: EM | Admit: 2022-03-29 | Discharge: 2022-03-29 | Disposition: A | Discharge status: Home / Self Care | Payer: BC Managed Care – PPO | Attending: Emergency Medicine | Admitting: Emergency Medicine

## 2022-03-29 ENCOUNTER — Ambulatory Visit: Payer: BC Managed Care – PPO | Admitting: Physician Assistant

## 2022-03-29 ENCOUNTER — Other Ambulatory Visit: Payer: BC Managed Care – PPO

## 2022-03-29 ENCOUNTER — Encounter (HOSPITAL_COMMUNITY): Payer: Self-pay | Admitting: *Deleted

## 2022-03-29 DIAGNOSIS — C9000 Multiple myeloma not having achieved remission: Secondary | ICD-10-CM

## 2022-03-29 DIAGNOSIS — M5010 Cervical disc disorder with radiculopathy, unspecified cervical region: Secondary | ICD-10-CM | POA: Diagnosis not present

## 2022-03-29 DIAGNOSIS — Z79899 Other long term (current) drug therapy: Secondary | ICD-10-CM | POA: Insufficient documentation

## 2022-03-29 DIAGNOSIS — R0789 Other chest pain: Secondary | ICD-10-CM | POA: Insufficient documentation

## 2022-03-29 DIAGNOSIS — M79602 Pain in left arm: Secondary | ICD-10-CM | POA: Diagnosis present

## 2022-03-29 DIAGNOSIS — I10 Essential (primary) hypertension: Secondary | ICD-10-CM | POA: Diagnosis not present

## 2022-03-29 DIAGNOSIS — Z794 Long term (current) use of insulin: Secondary | ICD-10-CM | POA: Diagnosis not present

## 2022-03-29 DIAGNOSIS — Z7982 Long term (current) use of aspirin: Secondary | ICD-10-CM | POA: Insufficient documentation

## 2022-03-29 DIAGNOSIS — Z7984 Long term (current) use of oral hypoglycemic drugs: Secondary | ICD-10-CM | POA: Diagnosis not present

## 2022-03-29 DIAGNOSIS — E119 Type 2 diabetes mellitus without complications: Secondary | ICD-10-CM | POA: Insufficient documentation

## 2022-03-29 LAB — COMPREHENSIVE METABOLIC PANEL
ALT: 17 U/L (ref 0–44)
AST: 25 U/L (ref 15–41)
Albumin: 2.8 g/dL — ABNORMAL LOW (ref 3.5–5.0)
Alkaline Phosphatase: 137 U/L — ABNORMAL HIGH (ref 38–126)
Anion gap: 9 (ref 5–15)
BUN: 19 mg/dL (ref 6–20)
CO2: 22 mmol/L (ref 22–32)
Calcium: 8.5 mg/dL — ABNORMAL LOW (ref 8.9–10.3)
Chloride: 103 mmol/L (ref 98–111)
Creatinine, Ser: 0.89 mg/dL (ref 0.44–1.00)
GFR, Estimated: >60 mL/min (ref >60)
Glucose, Bld: 175 mg/dL — ABNORMAL HIGH (ref 70–99)
Potassium: 4 mmol/L (ref 3.5–5.1)
Sodium: 134 mmol/L — ABNORMAL LOW (ref 135–145)
Total Bilirubin: 0.8 mg/dL (ref 0.3–1.2)
Total Protein: 6.1 g/dL — ABNORMAL LOW (ref 6.5–8.1)

## 2022-03-29 LAB — CBC WITH DIFFERENTIAL/PLATELET
Abs Immature Granulocytes: 0.08 10*3/uL — ABNORMAL HIGH (ref 0.00–0.07)
Basophils Absolute: 0 10*3/uL (ref 0.0–0.1)
Basophils Relative: 0 %
Eosinophils Absolute: 0 10*3/uL (ref 0.0–0.5)
Eosinophils Relative: 0 %
HCT: 26.6 % — ABNORMAL LOW (ref 36.0–46.0)
Hemoglobin: 8.4 g/dL — ABNORMAL LOW (ref 12.0–15.0)
Immature Granulocytes: 1 %
Lymphocytes Relative: 15 %
Lymphs Abs: 0.9 10*3/uL (ref 0.7–4.0)
MCH: 28.5 pg (ref 26.0–34.0)
MCHC: 31.6 g/dL (ref 30.0–36.0)
MCV: 90.2 fL (ref 80.0–100.0)
Monocytes Absolute: 1.4 10*3/uL — ABNORMAL HIGH (ref 0.1–1.0)
Monocytes Relative: 23 %
Neutro Abs: 3.7 10*3/uL (ref 1.7–7.7)
Neutrophils Relative %: 61 %
Platelets: 63 10*3/uL — ABNORMAL LOW (ref 150–400)
RBC: 2.95 MIL/uL — ABNORMAL LOW (ref 3.87–5.11)
RDW: 19.3 % — ABNORMAL HIGH (ref 11.5–15.5)
WBC: 6.1 10*3/uL (ref 4.0–10.5)
nRBC: 1 % — ABNORMAL HIGH (ref 0.0–0.2)

## 2022-03-29 LAB — MAGNESIUM: Magnesium: 1.6 mg/dL — ABNORMAL LOW (ref 1.7–2.4)

## 2022-03-29 LAB — TROPONIN I (HIGH SENSITIVITY): Troponin I (High Sensitivity): 9 ng/L (ref <18)

## 2022-03-29 LAB — SAMPLE TO BLOOD BANK

## 2022-03-29 MED ORDER — HEPARIN SOD (PORK) LOCK FLUSH 100 UNIT/ML IV SOLN
INTRAVENOUS | Status: AC
  Administered 2022-03-29: 500 [IU]
  Filled 2022-03-29: qty 5

## 2022-03-29 MED ORDER — IOHEXOL 350 MG/ML SOLN
75.0000 mL | Freq: Once | INTRAVENOUS | Status: AC | PRN
  Administered 2022-03-29: 75 mL via INTRAVENOUS

## 2022-03-29 MED ORDER — LORAZEPAM 2 MG/ML IJ SOLN
0.5000 mg | Freq: Once | INTRAMUSCULAR | Status: AC
  Administered 2022-03-29: 0.5 mg via INTRAVENOUS
  Filled 2022-03-29: qty 1

## 2022-03-29 MED ORDER — HEPARIN SOD (PORK) LOCK FLUSH 100 UNIT/ML IV SOLN: 500.0000 [IU] | Freq: Once | INTRAVENOUS | Status: AC

## 2022-03-29 NOTE — Progress Notes (Signed)
I examined Mr. Joanna Reid today due to new onset left LUE pain that is radiating to her chest. She reports the pain has been present for the last 2-3 days. She has some tingling in her fingertips in the left hand. She had EKG yesterday that showed normal sinus rhythm. Vital stable. Recommend ED evaluation to rule out PE and LUE DVT. Patient expressed understanding of the plan.

## 2022-03-29 NOTE — Progress Notes (Signed)
13:10 pm. Called report to Fabio Neighbors RN / Camera operator and gave report. Patient going to room 4 per charge RN.

## 2022-03-29 NOTE — Progress Notes (Signed)
Patient reports today for Kyprolsis and Dara. She has pain in her left arm that radiates to her chest, she went to urgent care on Monday and EKG was the same as baseline, she had an appointment with Dr. Luna Glasgow on Tuesday and he did an x-ray that he thought may be a pitched nerve but wasn't sure if it was due to the cancer. She is set up for an MRI tomorrow. She states the pain in her chest is better than yesterday, but she feels weak/tired. The pain is causing nausea with vomiting. She is taking oxycodone and flexeril for pain, Dede Query PA-C made aware of patient's symptoms and assessed patient in room.  Hemoglobin 8.4, no blood needed tomorrow. Per Dede Query PA-C patient will be sent to ED to rule out PE and LUE DVT. Patient sent to ED via wheelchair by NT with husband.

## 2022-03-29 NOTE — ED Triage Notes (Signed)
Pt c/o pain to left arm and up shoulder and down to hand with some numbness;  pt went to urgent care and had an ekg done that showed nothing significant;  pt c/o chest pain today with some nausea; pt was seen at her ortho doctor and had xrays that were negative

## 2022-03-29 NOTE — Discharge Instructions (Signed)
As discussed, you were CT imaging and the ultrasound of your arm are negative for blood clots/pulmonary embolism.  Continue to take the pain medication that was prescribed by Dr. Luna Glasgow yesterday as needed.

## 2022-03-29 NOTE — ED Provider Notes (Signed)
[facility] EMERGENCY DEPARTMENT Provider Note   CSN: [id number] Arrival date & time: 03/29/22  1325     History  Chief Complaint  Patient presents with   Arm Pain    Joanna Reid is a 56 y.o. female for hypertension, type 2 diabetes, multiple myeloma actively being treated with chemotherapy, was scheduled for a treatment today but was sent here secondary to left arm pain and left chest pain, pleuritic in character but without shortness of breath which has been present for the past 3 to 4 days.  She was seen at a urgent care center 3 days ago and then saw Dr. Luna Glasgow 2 days ago who is suspicious for cervical radiculopathy, pending further imaging of her C-spine.  She presented to her oncology clinic today, labs were drawn but she did not receive today's treatment, instead was sent here to rule out pulmonary embolism as source of her symptoms.  Her pain is worsened with movement, she endorses intermittent numbness and tingling radiating into her left hand.  She denies right sided involvement.  She has had no increased swelling of the left arm.  She has been prescribed oxycodone which does significantly relieve her symptoms.  She has also had a steroid injection which additionally improved her symptoms.  The history is provided by the patient.       Home Medications Prior to Admission medications   Medication Sig Start Date End Date Taking? Authorizing Provider  acyclovir (ZOVIRAX) 400 MG tablet TAKE (1) TABLET BY MOUTH TWICE DAILY. 03/01/22   Derek Jack, MD  albuterol (VENTOLIN HFA) 108 (90 Base) MCG/ACT inhaler Inhale 2 puffs into the lungs every 4 (four) hours as needed for wheezing or shortness of breath. 11/30/20   Lovena Le, Malena M, DO  allopurinol (ZYLOPRIM) 300 MG tablet TAKE (1) TABLET BY MOUTH ONCE DAILY. 01/23/22   Lindell Spar, MD  amLODipine (NORVASC) 2.5 MG tablet Take 2.5 mg by mouth daily. 01/27/21   [provider]  aspirin EC 81 MG tablet Take 1  tablet (81 mg total) by mouth daily. 12/26/19   Rehman, Mechele Dawley, MD  BD PEN NEEDLE NANO U/F 32G X 4 MM MISC Inject into the skin. 01/31/22   [provider]  bumetanide (BUMEX) 0.5 MG tablet Take 0.5 mg by mouth daily.  06/30/19   [provider]  carvedilol (COREG) 25 MG tablet Take 1 tablet (25 mg total) by mouth 2 (two) times daily. 12/29/19   Thurnell Lose, MD  cyclobenzaprine (FLEXERIL) 5 MG tablet Take 1 tablet (5 mg total) by mouth 3 (three) times daily as needed for muscle spasms. Do not drink alcohol or drive while taking this medication.  May cause drowsiness. 03/26/22   Volney American, PA-C  DARZALEX FASPRO 1800-30000 MG-UT/15ML SOLN Inject 15 ml over 3-5 minutes subcutaneously once every 2 weeks. 03/27/22   Derek Jack, MD  diclofenac Sodium (VOLTAREN) 1 % GEL Apply 2 g topically 4 (four) times daily. 05/01/21   Caccavale, Sophia, PA-C  dicyclomine (BENTYL) 20 MG tablet Take 1 tablet (20 mg total) by mouth 2 (two) times daily as needed for spasms. 05/01/21   Caccavale, Sophia, PA-C  Docosanol 10 % CREA Apply 1 application topically daily as needed (for nose).     [provider]  docusate sodium (COLACE) 100 MG capsule Take 100 mg by mouth daily.     [provider]  Dulaglutide 1.5 MG/0.5ML SOPN Inject 1.5 mg into the skin every 7 (seven) days.  [provider]  erythromycin ophthalmic ointment Place 1 application into the left eye at bedtime. 08/07/21   Lindell Spar, MD  escitalopram (LEXAPRO) 10 MG tablet TAKE (1) TABLET BY MOUTH ONCE DAILY. MAY START WITH 1/2 TABLET FOR 7 DAYS. 01/23/22   Lindell Spar, MD  fluticasone Asencion Islam) 50 MCG/ACT nasal spray INSTILL 2 SPRAYS INTO BOTH NOSTRILS DAILY 06/28/21   Lindell Spar, MD  Insulin Lispro w/ Trans Port 100 UNIT/ML SOPN Inject before meals per sliding scale. Max TDD 20 units 01/31/22   [provider]  KYPROLIS 30 MG SOLR Inject into the vein. 01/29/22   [provider]  KYPROLIS 60 MG SOLR INFUSE 40MG INTRAVENOUSLY FOR 1 DOSE 02/06/22   Derek Jack, MD  Lactulose 20 GM/30ML SOLN Take 30 mLs (20 g total) by mouth daily. 09/27/21   Derek Jack, MD  lidocaine-prilocaine (EMLA) cream Apply 1 Application topically as needed (Apply to port prior to treatment and flushes). 01/24/22   Derek Jack, MD  LORazepam (ATIVAN) 0.5 MG tablet Take 1 tablet (0.5 mg total) by mouth every 8 (eight) hours. 03/22/22   Lincoln Brigham, PA-C  losartan (COZAAR) 100 MG tablet Take 100 mg by mouth daily.    [provider]  magnesium oxide (MAG-OX) 400 (241.3 Mg) MG tablet Take 1 tablet (400 mg total) by mouth 3 (three) times daily. 10/02/18   Derek Jack, MD  metFORMIN (GLUCOPHAGE) 500 MG tablet Take 500 mg by mouth 2 (two) times daily. 11/16/21 03/01/22  [provider]  nitrofurantoin, macrocrystal-monohydrate, (MACROBID) 100 MG capsule TAKE (1) CAPSULE BY MOUTH AT BEDTIME. 03/14/22   Derek Jack, MD  NOVOLOG FLEXPEN 100 UNIT/ML FlexPen Inject into the skin. 01/31/22   [provider]  oxyCODONE-acetaminophen (PERCOCET/ROXICET) 5-325 MG tablet Take 1 tablet by mouth 2 (two) times daily as needed for severe pain. 03/26/22   Volney American, PA-C  oxyCODONE-acetaminophen (PERCOCET/ROXICET) 5-325 MG tablet One tablet every four hours as need for pain,  5 day limit. 03/27/22   Sanjuana Kava, MD  pantoprazole (PROTONIX) 40 MG tablet TAKE (1) TABLET BY MOUTH ONCE DAILY. 01/17/22   Lindell Spar, MD  pregabalin (LYRICA) 200 MG capsule TAKE (1) CAPSULE BY MOUTH TWICE DAILY. 01/25/22   Derek Jack, MD  Propylene Glycol (SYSTANE BALANCE) 0.6 % SOLN Apply 1 drop to eye daily as needed (dry eye).     [provider]  rosuvastatin (CRESTOR) 5 MG tablet Take 5 mg by mouth daily. 10/17/20   [provider]  scopolamine (TRANSDERM-SCOP) 1 MG/3DAYS Place 1 patch (1.5 mg total) onto the skin every 3  (three) days. 03/14/22   Derek Jack, MD  valACYclovir (VALTREX) 1000 MG tablet TAKE 2 TABLETS BY MOUTH NOW; THEN 2 12 HOURS LATER. 12/19/21   Derek Jack, MD  VELCADE 3.5 MG injection RECONSTITUTE EACH VIAL AS DIRECTED. INJECT 2.5 MG UNDER THE SKIN EVERY 14 DAYS. 01/22/22   Derek Jack, MD      Allergies    Patient has no known allergies.    Review of Systems   Review of Systems  Constitutional:  Negative for chills and fever.  HENT:  Negative for congestion and sore throat.   Eyes: Negative.   Respiratory:  Negative for chest tightness, shortness of breath and wheezing.   Cardiovascular:  Positive for chest pain. Negative for palpitations and leg swelling.  Gastrointestinal:  Negative for abdominal pain and nausea.  Genitourinary: Negative.   Musculoskeletal:  Positive for arthralgias.  Negative for joint swelling and neck pain.  Skin: Negative.  Negative for rash and wound.  Neurological:  Positive for weakness. Negative for dizziness, light-headedness and headaches.  Psychiatric/Behavioral: Negative.    All other systems reviewed and are negative.   Physical Exam Updated Vital Signs BP 131/80   Pulse 79   Temp 99.2 F (37.3 C)   Resp 19   Ht 5' 4" (1.626 m)   Wt 117.3 kg   SpO2 94%   BMI 44.39 kg/m  Physical Exam Vitals and nursing note reviewed.  Constitutional:      Appearance: She is well-developed.     Comments: Pale complexion  HENT:     Head: Normocephalic and atraumatic.     Mouth/Throat:     Mouth: Mucous membranes are moist.  Eyes:     Conjunctiva/sclera: Conjunctivae normal.  Cardiovascular:     Rate and Rhythm: Normal rate and regular rhythm.     Heart sounds: Normal heart sounds. No murmur heard. Pulmonary:     Effort: Pulmonary effort is normal.     Breath sounds: Normal breath sounds. No wheezing.  Chest:     Chest wall: No tenderness.  Abdominal:     General: Bowel sounds are normal.     Palpations: Abdomen is soft.      Tenderness: There is no abdominal tenderness.  Musculoskeletal:        General: No swelling or deformity. Normal range of motion.     Left shoulder: Tenderness present. No swelling, deformity or crepitus.     Cervical back: Normal range of motion.     Comments: Pain at the left shoulder is triggered with range of motion.  Skin:    General: Skin is warm and dry.  Neurological:     Mental Status: She is alert.     Cranial Nerves: Cranial nerves 2-12 are intact. No cranial nerve deficit.     Sensory: Sensation is intact.     Comments: 4 out of 5 grip strength left, 5 out of 5 right     ED Results / Procedures / Treatments   Labs (all labs ordered are listed, but only abnormal results are displayed) Labs Reviewed  TROPONIN I (HIGH SENSITIVITY)    EKG EKG Interpretation  Date/Time:  Thursday March 29 2022 14:45:16 EDT Ventricular Rate:  78 PR Interval:  186 QRS Duration: 141 QT Interval:  424 QTC Calculation: 483 R Axis:   33 Text Interpretation: Sinus rhythm Right bundle branch block Nonspecific T wave abnormality Baseline wander Confirmed by Lajean Saver [phone]) on 03/29/2022 2:57:30 PM  Radiology CT Angio Chest PE W and/or Wo Contrast  Result Date: 03/29/2022 CLINICAL DATA:  Suspected pulmonary embolus. EXAM: CT ANGIOGRAPHY CHEST WITH CONTRAST TECHNIQUE: Multidetector CT imaging of the chest was performed using the standard protocol during bolus administration of intravenous contrast. Multiplanar CT image reconstructions and MIPs were obtained to evaluate the vascular anatomy. RADIATION DOSE REDUCTION: This exam was performed according to the departmental dose-optimization program which includes automated exposure control, adjustment of the mA and/or kV according to patient size and/or use of iterative reconstruction technique. CONTRAST:  77m OMNIPAQUE IOHEXOL 350 MG/ML SOLN COMPARISON:  None Available. FINDINGS: Cardiovascular: Satisfactory opacification of the pulmonary  arteries to the segmental level. No evidence of pulmonary embolism. Normal heart size. No pericardial effusion. Mediastinum/Nodes: No enlarged mediastinal, hilar, or axillary lymph nodes. Thyroid gland, trachea, and esophagus demonstrate no significant findings. Lungs/Pleura: Scattered atelectatic changes, otherwise clear. Upper Abdomen: No acute abnormality.  Musculoskeletal: Sclerotic changes, remodeling and compression deformity of T6 vertebral body. Small lytic lesions in multiple other thoracic vertebral bodies without pathologic fracture. Review of the MIP images confirms the above findings. IMPRESSION: 1. No evidence of pulmonary embolus. 2. Sclerotic changes, remodeling and compression deformity of T6 vertebral body, with small lytic lesions in multiple other thoracic vertebral bodies without pathologic fracture. These are likely sequela multiple myeloma. Electronically Signed   By: Fidela Salisbury M.D.   On: 03/29/2022 16:00   US Venous Img Upper Uni Left (DVT)  Result Date: 03/29/2022 CLINICAL DATA:  56 year old female with left arm pain and numbness EXAM: LEFT UPPER EXTREMITY VENOUS DOPPLER ULTRASOUND TECHNIQUE: Gray-scale sonography with graded compression, as well as color Doppler and duplex ultrasound were performed to evaluate the upper extremity deep venous system from the level of the subclavian vein and including the jugular, axillary, basilic, radial, ulnar and upper cephalic vein. Spectral Doppler was utilized to evaluate flow at rest and with distal augmentation maneuvers. COMPARISON:  None Available. FINDINGS: Contralateral Subclavian Vein: Respiratory phasicity is normal and symmetric with the symptomatic side. No evidence of thrombus. Normal compressibility. Internal Jugular Vein: No evidence of thrombus. Normal compressibility, respiratory phasicity and response to augmentation. Subclavian Vein: No evidence of thrombus. Normal compressibility, respiratory phasicity and response to  augmentation. Axillary Vein: No evidence of thrombus. Normal compressibility, respiratory phasicity and response to augmentation. Cephalic Vein: No evidence of thrombus. Normal compressibility, respiratory phasicity and response to augmentation. Basilic Vein: No evidence of thrombus. Normal compressibility, respiratory phasicity and response to augmentation. Brachial Veins: No evidence of thrombus. Normal compressibility, respiratory phasicity and response to augmentation. Radial Veins: No evidence of thrombus. Normal compressibility, respiratory phasicity and response to augmentation. Ulnar Veins: No evidence of thrombus. Normal compressibility, respiratory phasicity and response to augmentation. Other Findings:  None visualized. IMPRESSION: Directed duplex of the left upper extremity negative for DVT Signed, Dulcy Fanny. Nadene Rubins, RPVI [facility] Rad[facility] Electronically Signed   By: Corrie Mckusick D.O.   On: 03/29/2022 15:27    Procedures Procedures    Medications Ordered in ED Medications  heparin lock flush 100 unit/mL (has no administration in time range)  heparin lock flush 100 UNIT/ML injection (has no administration in time range)  LORazepam (ATIVAN) injection 0.5 mg (0.5 mg Intravenous Given 03/29/22 1448)  iohexol (OMNIPAQUE) 350 MG/ML injection 75 mL (75 mLs Intravenous Contrast Given 03/29/22 1530)    ED Course/ Medical Decision Making/ A&P                           Medical Decision Making Patient was actively being treated for multiple myeloma presenting with left shoulder pain with tingling and numbness intermittently rating into her hand.  Pleuritic chest pain, seen by oncology today and sent here to rule out pulmonary embolism.  She has negative CT angio, she does have small lytic lesions in multiple thoracic vertebrae without pathologic fractures, suggesting sequelae of her multiple myeloma.  Given her significant symptoms in her  left arm, we also obtained an ultrasound of her left upper extremity, there is no DVT present.  She is under the care of of Dr. Luna Glasgow and has an MRI of her C-spine scheduled for tomorrow for suspected cervical radiculopathy.  She was encouraged to keep this appointment.  She does have a prescription for oxycodone which she states has been managing her pain fairly well.  She was felt medically stable  for discharge home at this time.  She also has her next appointment with Dr. Delton Coombes in 1 week.  Amount and/or Complexity of Data Reviewed Labs: ordered.    Details: CBC and c-Met obtained at the oncology clinic today prior to arrival here so not repeated.  Troponin completed here is negative. Radiology: ordered.    Details: Per above ECG/medicine tests: ordered and independent interpretation performed.    Details: Sinus rhythm, rate 78, right bundle branch block.           Final Clinical Impression(s) / ED Diagnoses Final diagnoses:  Cervical disc disorder with radiculopathy of cervical region    Rx / DC Orders ED Discharge Orders     None         Landis Martins 03/29/22 1723    Lajean Saver, MD 03/30/22 1100

## 2022-03-30 ENCOUNTER — Ambulatory Visit (HOSPITAL_COMMUNITY)
Admission: RE | Admit: 2022-03-30 | Discharge: 2022-03-30 | Disposition: A | Discharge status: Home / Self Care | Payer: BC Managed Care – PPO | Source: Ambulatory Visit | Attending: Orthopaedic Surgery | Admitting: Orthopaedic Surgery

## 2022-03-30 ENCOUNTER — Other Ambulatory Visit: Payer: Self-pay | Admitting: Orthopaedic Surgery

## 2022-03-30 DIAGNOSIS — C9000 Multiple myeloma not having achieved remission: Secondary | ICD-10-CM | POA: Diagnosis present

## 2022-03-30 MED ORDER — GADOPICLENOL 0.5 MMOL/ML IV SOLN
10.0000 mL | Freq: Once | INTRAVENOUS | Status: AC | PRN
  Administered 2022-03-30: 10 mL via INTRAVENOUS

## 2022-04-02 ENCOUNTER — Encounter: Payer: Self-pay | Admitting: *Deleted

## 2022-04-02 NOTE — Progress Notes (Signed)
Oncology Discharge Planning Note  [facility] at [facility] Address: [address], [place] [address] Hours of Operation:  8am - 5pm, Monday - Friday  Clinic Contact Information:  [phone]  Oncology Care Team: Medical Oncologist:  Derek Jack  Patient Details: Name:  Joanna Reid, Joanna Reid MRN:   [id number] DOB:   Nov 06, 1965 Reason for Current Admission: '@PPROB'$ @  Discharge Planning Narrative: Discharge follow-up appointments for oncology are current and available on the AVS and MyChart.   Upon discharge from the hospital, hematology/oncology's post discharge plan of care for the outpatient setting is: 04/05/22, as previously scheduled.    Outpatient Oncology Specific Care Only: Oncology appointment transportation needs addressed?:  not applicable Oncology medication management for symptom management addressed?:  not applicable Chemo Alert Card reviewed?:  not applicable Immunotherapy Alert Card reviewed?:  not applicable

## 2022-04-05 ENCOUNTER — Other Ambulatory Visit: Payer: BC Managed Care – PPO

## 2022-04-05 ENCOUNTER — Ambulatory Visit: Payer: BC Managed Care – PPO

## 2022-04-05 ENCOUNTER — Inpatient Hospital Stay: Payer: BC Managed Care – PPO

## 2022-04-05 ENCOUNTER — Inpatient Hospital Stay (HOSPITAL_BASED_OUTPATIENT_CLINIC_OR_DEPARTMENT_OTHER): Payer: BC Managed Care – PPO | Admitting: Hematology

## 2022-04-05 DIAGNOSIS — C9000 Multiple myeloma not having achieved remission: Secondary | ICD-10-CM

## 2022-04-05 DIAGNOSIS — Z5112 Encounter for antineoplastic immunotherapy: Secondary | ICD-10-CM | POA: Diagnosis not present

## 2022-04-05 LAB — COMPREHENSIVE METABOLIC PANEL
ALT: 16 U/L (ref 0–44)
AST: 20 U/L (ref 15–41)
Albumin: 2.6 g/dL — ABNORMAL LOW (ref 3.5–5.0)
Alkaline Phosphatase: 121 U/L (ref 38–126)
Anion gap: 9 (ref 5–15)
BUN: 17 mg/dL (ref 6–20)
CO2: 23 mmol/L (ref 22–32)
Calcium: 8.5 mg/dL — ABNORMAL LOW (ref 8.9–10.3)
Chloride: 102 mmol/L (ref 98–111)
Creatinine, Ser: 1.06 mg/dL — ABNORMAL HIGH (ref 0.44–1.00)
GFR, Estimated: >60 mL/min (ref >60)
Glucose, Bld: 175 mg/dL — ABNORMAL HIGH (ref 70–99)
Potassium: 4 mmol/L (ref 3.5–5.1)
Sodium: 134 mmol/L — ABNORMAL LOW (ref 135–145)
Total Bilirubin: 0.9 mg/dL (ref 0.3–1.2)
Total Protein: 6.1 g/dL — ABNORMAL LOW (ref 6.5–8.1)

## 2022-04-05 LAB — CBC WITH DIFFERENTIAL/PLATELET
Abs Immature Granulocytes: 0.04 10*3/uL (ref 0.00–0.07)
Basophils Absolute: 0 10*3/uL (ref 0.0–0.1)
Basophils Relative: 0 %
Eosinophils Absolute: 0 10*3/uL (ref 0.0–0.5)
Eosinophils Relative: 1 %
HCT: 24.2 % — ABNORMAL LOW (ref 36.0–46.0)
Hemoglobin: 7.6 g/dL — ABNORMAL LOW (ref 12.0–15.0)
Immature Granulocytes: 1 %
Lymphocytes Relative: 19 %
Lymphs Abs: 1 10*3/uL (ref 0.7–4.0)
MCH: 28.8 pg (ref 26.0–34.0)
MCHC: 31.4 g/dL (ref 30.0–36.0)
MCV: 91.7 fL (ref 80.0–100.0)
Monocytes Absolute: 0.9 10*3/uL (ref 0.1–1.0)
Monocytes Relative: 16 %
Neutro Abs: 3.4 10*3/uL (ref 1.7–7.7)
Neutrophils Relative %: 63 %
Platelets: 137 10*3/uL — ABNORMAL LOW (ref 150–400)
RBC: 2.64 MIL/uL — ABNORMAL LOW (ref 3.87–5.11)
RDW: 19.5 % — ABNORMAL HIGH (ref 11.5–15.5)
WBC: 5.4 10*3/uL (ref 4.0–10.5)
nRBC: 1.1 % — ABNORMAL HIGH (ref 0.0–0.2)

## 2022-04-05 LAB — MAGNESIUM: Magnesium: 1.6 mg/dL — ABNORMAL LOW (ref 1.7–2.4)

## 2022-04-05 MED ORDER — SODIUM CHLORIDE 0.9 % IV SOLN
20.0000 mg | Freq: Once | INTRAVENOUS | Status: AC
  Administered 2022-04-05: 20 mg via INTRAVENOUS
  Filled 2022-04-05: qty 20

## 2022-04-05 MED ORDER — SODIUM CHLORIDE 0.9% FLUSH
10.0000 mL | INTRAVENOUS | Status: DC | PRN
  Administered 2022-04-05: 10 mL

## 2022-04-05 MED ORDER — SODIUM CHLORIDE 0.9 % IV SOLN: Freq: Once | INTRAVENOUS | Status: AC

## 2022-04-05 MED ORDER — DEXTROSE 5 % IV SOLN
70.0000 mg/m2 | Freq: Once | INTRAVENOUS | Status: AC
  Administered 2022-04-05: 150 mg via INTRAVENOUS
  Filled 2022-04-05: qty 75

## 2022-04-05 MED ORDER — DARATUMUMAB-HYALURONIDASE-FIHJ 1800-30000 MG-UT/15ML ~~LOC~~ SOLN
1800.0000 mg | Freq: Once | SUBCUTANEOUS | Status: AC
  Administered 2022-04-05: 1800 mg via SUBCUTANEOUS
  Filled 2022-04-05: qty 15

## 2022-04-05 MED ORDER — DIPHENHYDRAMINE HCL 25 MG PO CAPS
50.0000 mg | ORAL_CAPSULE | Freq: Once | ORAL | Status: AC
  Administered 2022-04-05: 50 mg via ORAL
  Filled 2022-04-05: qty 2

## 2022-04-05 MED ORDER — HEPARIN SOD (PORK) LOCK FLUSH 100 UNIT/ML IV SOLN
500.0000 [IU] | Freq: Once | INTRAVENOUS | Status: AC | PRN
  Administered 2022-04-05: 500 [IU]

## 2022-04-05 MED ORDER — ACETAMINOPHEN 325 MG PO TABS
650.0000 mg | ORAL_TABLET | Freq: Once | ORAL | Status: AC
  Administered 2022-04-05: 650 mg via ORAL
  Filled 2022-04-05: qty 2

## 2022-04-05 NOTE — Progress Notes (Signed)
Patient has been assessed, vital signs and labs have been reviewed by Dr. Katragadda. ANC, Creatinine, LFTs, and Platelets are within treatment parameters per Dr. Katragadda. The patient is good to proceed with treatment at this time. Primary RN and pharmacy aware.  

## 2022-04-05 NOTE — Patient Instructions (Addendum)
[facility]  Discharge Instructions  You were seen and examined today by Dr. Delton Coombes.   You may proceed with your treatment today as planned.  Follow-up as scheduled.  Thank you for choosing [facility] to provide your oncology and hematology care.   To afford each patient quality time with our provider, please arrive at least 15 minutes before your scheduled appointment time. You may need to reschedule your appointment if you arrive late (10 or more minutes). Arriving late affects you and other patients whose appointments are after yours.  Also, if you miss three or more appointments without notifying the office, you may be dismissed from the clinic at the provider's discretion.    Again, thank you for choosing [facility].  Our hope is that these requests will decrease the amount of time that you wait before being seen by our physicians.   If you have a lab appointment with the [facility] please come in thru the Main Entrance and check in at the main information desk.           _____________________________________________________________  Should you have questions after your visit to [facility], please contact our office at [phone] and follow the prompts.  Our office hours are 8:00 a.m. to 4:30 p.m. Monday - Thursday and 8:00 a.m. to 2:30 p.m. Friday.  Please note that voicemails left after 4:00 p.m. may not be returned until the following business day.  We are closed weekends and all major holidays.  You do have access to a nurse 24-7, just call the main number to the clinic [phone] and do not press any options, hold on the line and a nurse will answer the phone.    For prescription refill requests, have your pharmacy contact our office and allow 72 hours.    Masks are optional in the cancer centers. If you would like for your care team to wear a mask while they are taking care of you,  please let them know. You may have one support person who is at least 57 years old accompany you for your appointments.

## 2022-04-05 NOTE — Progress Notes (Signed)
Patient missed Cyle 2 day 22 of Darzale Faspro SQ, Dr Delton Coombes wants to add Darzalex Faspro SQ to Cycle 3 day 8 to catch up doses.  Added to plan  T.O. Dr Rhys Martini, PharmD

## 2022-04-05 NOTE — Progress Notes (Signed)
Patient presents today for Daratumumab and Kyprolis per providers order.  Vital signs within parameters for treatment.  Labs pending.  Patient says she feels better this week.  She is still having some nausea.  Labs reviewed by MD.  Message received from Adonis Huguenin RN/Dr. Delton Coombes, patient okay for treatment.  Treatment given today per MD orders.  Stable during infusion without adverse affects.  Daratumumab administration without incident; injection site WNL; see MAR for injection details. Vital signs stable.  No complaints at this time.  Discharge from clinic ambulatory in stable condition.  Alert and oriented X 3.  Follow up with [facility] as scheduled.

## 2022-04-05 NOTE — Progress Notes (Signed)
[address] [place], [place] [address]   CLINIC:  Medical Oncology/Hematology  PCP:  Derek Jack, [place] / [place] [address] [phone]   REASON FOR VISIT:  Follow-up for multiple myeloma  PRIOR THERAPY:  1. RVD x 4 cycles from 11/01/2017 through 01/14/2018. 2. Stem cell transplant on 02/20/2018. 3. Velcade from 06/11/2018 to 12/22/2019.  NGS Results: not done  CURRENT THERAPY: Daratumumab, carfilzomib and dexamethasone  BRIEF ONCOLOGIC HISTORY:  Oncology History  Multiple myeloma ([facility])  10/29/2017 Initial Diagnosis   Multiple myeloma ([place])   11/01/2017 - 01/24/2018 Chemotherapy   The patient had dexamethasone (DECADRON) 4 MG tablet, 1 of 1 cycle, Start date: --, End date: -- lenalidomide (REVLIMID) 25 MG capsule, 1 of 1 cycle, Start date: --, End date: -- bortezomib SQ (VELCADE) chemo injection 3 mg, 1.3 mg/m2 = 3 mg, Subcutaneous,  Once, 5 of 5 cycles Administration: 3 mg (11/01/2017), 3 mg (11/08/2017), 3 mg (11/05/2017), 3 mg (11/22/2017), 3 mg (11/12/2017), 3 mg (11/29/2017), 3 mg (11/26/2017), 3 mg (12/03/2017), 3 mg (12/13/2017), 3 mg (12/20/2017), 3 mg (12/17/2017), 3 mg (12/24/2017), 3 mg (01/03/2018), 3 mg (01/10/2018), 3 mg (01/07/2018), 3 mg (01/14/2018), 3 mg (01/24/2018)  for chemotherapy treatment.    06/11/2018 - 12/20/2021 Chemotherapy   Patient is on Treatment Plan : MYELOMA MAINTENANCE Bortezomib SQ q 7d x 6 weeks, two weeks off then q 14d     01/25/2022 - 02/08/2022 Chemotherapy   Patient is on Treatment Plan : MYELOMA RELAPSED/REFRACTORY Carfilzomib (20/70) + Daratumumab SQ + Dexamethasone (20/40) DaraKd q28d     01/25/2022 -  Chemotherapy   Patient is on Treatment Plan : MYELOMA RELAPSED/REFRACTORY Carfilzomib (20/70) + Daratumumab SQ + Dexamethasone (20/40) DaraKd q28d       CANCER STAGING:  Cancer Staging  Multiple myeloma ([place]) Staging form: Plasma Cell Myeloma and Plasma Cell Disorders, AJCC 8th Edition - Clinical: No  stage assigned - Unsigned - Clinical: No stage assigned - Unsigned   INTERVAL HISTORY:  [name], a 56 y.o. female, seen for follow-up of multiple myeloma.  Last week her treatment was held as she had pain in her left arm and was sent to ER for evaluation of cardiac etiology.  She did not have any cardiac related pain.  Today she reports that pain has improved.  She had a CT angiogram while in the ER which was negative for pulmonary embolism.  She also had C-spine MRI.   REVIEW OF SYSTEMS:  Review of Systems  Constitutional:  Negative for appetite change and fatigue.  Respiratory:  Positive for shortness of breath.   Gastrointestinal:  Negative for diarrhea and nausea.  Neurological:  Positive for numbness.  All other systems reviewed and are negative.   PAST MEDICAL/SURGICAL HISTORY:  Past Medical History:  Diagnosis Date   Acid reflux    Allergic rhinitis    Cancer ([place])    multiple myeloma   Diabetes mellitus    type 2   Gout    Gout    H/o COVID-19--- was Positive 09/17/2019, Negative 12/23/19 AND also Neg on  12/26/19 12/26/2019   HBP (high blood pressure)    History of kidney stones    Migraines    Multiple myeloma ([facility]) 10/29/2017   Past Surgical History:  Procedure Laterality Date   BREAST CYST EXCISION Left    2009 no visible scar on skin   CESAREAN SECTION     COLONOSCOPY WITH PROPOFOL N/A  12/25/2019   Procedure: COLONOSCOPY WITH PROPOFOL;  Surgeon: Rogene Houston, MD;  Location: [facility];  Service: Endoscopy;  Laterality: N/A;  730   EXTRACORPOREAL SHOCK WAVE LITHOTRIPSY Left 10/10/2017   Procedure: LEFT EXTRACORPOREAL SHOCK WAVE LITHOTRIPSY (ESWL);  Surgeon: Bjorn Loser, MD;  Location: [facility];  Service: Urology;  Laterality: Left;   EYE SURGERY     HEMORRHOID SURGERY N/A 11/19/2012   Procedure: HEMORRHOIDECTOMY;  Surgeon: Jamesetta So, MD;  Location: [facility];  Service: General;  Laterality: N/A;   IR IMAGING GUIDED PORT INSERTION  01/18/2022    kidney stones  1998   LAPAROSCOPIC UNILATERAL SALPINGO OOPHERECTOMY  05/14/2012   Procedure: LAPAROSCOPIC UNILATERAL SALPINGO OOPHORECTOMY;  Surgeon: [name], MD;  Location: [facility];  Service: Gynecology;  Laterality: Right;  laparoscopic right salpingo-oophorectomy   PARTIAL HYSTERECTOMY     POLYPECTOMY  12/25/2019   Procedure: POLYPECTOMY;  Surgeon: Rogene Houston, MD;  Location: [facility];  Service: Endoscopy;;   TONSILECTOMY, ADENOIDECTOMY, BILATERAL MYRINGOTOMY AND TUBES     VESICOVAGINAL FISTULA CLOSURE W/ TAH      SOCIAL HISTORY:  Social History   Socioeconomic History   Marital status: Married    Spouse name: [name]   Number of children: 1   Years of education: 12   Highest education level: Some college, no degree  Occupational History    Employer: [employer]  Tobacco Use   Smoking status: Former    Types: Cigarettes    Quit date: 02/27/1999    Years since quitting: 23.1   Smokeless tobacco: Never   Tobacco comments:    socially   Vaping Use   Vaping Use: Never used  Substance and Sexual Activity   Alcohol use: No    Alcohol/week: 0.0 standard drinks of alcohol   Drug use: No   Sexual activity: Yes    Birth control/protection: Surgical    Comment: hyst  Other Topics Concern   Not on file  Social History Narrative   Not on file   Social Determinants of Health   Financial Resource Strain: Low Risk  (08/08/2021)   Overall Financial Resource Strain (CARDIA)    Difficulty of Paying Living Expenses: Not hard at all  Food Insecurity: No Food Insecurity (11/09/2019)   Hunger Vital Sign    Worried About Running Out of Food in the Last Year: Never true    [place] in the Last Year: Never true  Transportation Needs: No Transportation Needs (08/08/2021)   PRAPARE - Hydrologist (Medical): No    Lack of Transportation (Non-Medical): No  Physical Activity: Unknown (08/08/2021)   Exercise Vital Sign    Days of Exercise per Week:  Not on file    Minutes of Exercise per Session: 10 min  Stress: No Stress Concern Present (08/08/2021)   [place]    Feeling of Stress : Not at all  Social Connections: [place] (08/08/2021)   Social Connection and Isolation Panel [NHANES]    Frequency of Communication with Friends and Family: More than three times a week    Frequency of Social Gatherings with Friends and Family: Twice a week    Attends Religious Services: More than 4 times per year    Active Member of [employer] or Organizations: Yes    Attends Music therapist: More than 4 times per year    Marital Status: Married  Human resources officer Violence:  Not At Risk (08/08/2021)   Humiliation, Afraid, Rape, and Kick questionnaire    Fear of Current or Ex-Partner: No    Emotionally Abused: No    Physically Abused: No    Sexually Abused: No    FAMILY HISTORY:  Family History  Problem Relation Age of Onset   Arthritis Other    Cancer Other    Diabetes Other    Hypertension Mother    Dementia Mother    Diabetes Father    ALS Father    Diabetes Brother    Hypertension Brother    Cancer Paternal Aunt    COPD Maternal Grandmother    Cancer Maternal Grandfather    Anesthesia problems Paternal Grandfather     CURRENT MEDICATIONS:  Current Outpatient Medications  Medication Sig Dispense Refill   acyclovir (ZOVIRAX) 400 MG tablet TAKE (1) TABLET BY MOUTH TWICE DAILY. 60 tablet 6   albuterol (VENTOLIN HFA) 108 (90 Base) MCG/ACT inhaler Inhale 2 puffs into the lungs every 4 (four) hours as needed for wheezing or shortness of breath. 1 each 3   allopurinol (ZYLOPRIM) 300 MG tablet TAKE (1) TABLET BY MOUTH ONCE DAILY. 90 tablet 0   amLODipine (NORVASC) 2.5 MG tablet Take 2.5 mg by mouth daily.     aspirin EC 81 MG tablet Take 1 tablet (81 mg total) by mouth daily. 30 tablet 11   BD PEN NEEDLE NANO U/F 32G X 4 MM MISC Inject into the skin.      bumetanide (BUMEX) 0.5 MG tablet Take 0.5 mg by mouth daily.      carvedilol (COREG) 25 MG tablet Take 1 tablet (25 mg total) by mouth 2 (two) times daily.     cyclobenzaprine (FLEXERIL) 5 MG tablet Take 1 tablet (5 mg total) by mouth 3 (three) times daily as needed for muscle spasms. Do not drink alcohol or drive while taking this medication.  May cause drowsiness. 15 tablet 0   DARZALEX FASPRO 1800-30000 MG-UT/15ML SOLN Inject 15 ml over 3-5 minutes subcutaneously once every 2 weeks. 120 mL 0   diclofenac Sodium (VOLTAREN) 1 % GEL Apply 2 g topically 4 (four) times daily. 100 g 0   dicyclomine (BENTYL) 20 MG tablet Take 1 tablet (20 mg total) by mouth 2 (two) times daily as needed for spasms. 20 tablet 0   Docosanol 10 % CREA Apply 1 application topically daily as needed (for nose).      docusate sodium (COLACE) 100 MG capsule Take 100 mg by mouth daily.      Dulaglutide 1.5 MG/0.5ML SOPN Inject 1.5 mg into the skin every 7 (seven) days.     erythromycin ophthalmic ointment Place 1 application into the left eye at bedtime. 3.5 g 0   escitalopram (LEXAPRO) 10 MG tablet TAKE (1) TABLET BY MOUTH ONCE DAILY. MAY START WITH 1/2 TABLET FOR 7 DAYS. 90 tablet 0   fluticasone (FLONASE) 50 MCG/ACT nasal spray INSTILL 2 SPRAYS INTO BOTH NOSTRILS DAILY 16 g 4   Insulin Lispro w/ Trans Port 100 UNIT/ML SOPN Inject before meals per sliding scale. Max TDD 20 units     KYPROLIS 30 MG SOLR Inject into the vein.     KYPROLIS 60 MG SOLR INFUSE 40MG INTRAVENOUSLY FOR 1 DOSE 1 each 0   Lactulose 20 GM/30ML SOLN Take 30 mLs (20 g total) by mouth daily. 450 mL 1   lidocaine-prilocaine (EMLA) cream Apply 1 Application topically as needed (Apply to port prior to treatment and flushes). 30 g  6   LORazepam (ATIVAN) 0.5 MG tablet Take 1 tablet (0.5 mg total) by mouth every 8 (eight) hours. 30 tablet 0   losartan (COZAAR) 100 MG tablet Take 100 mg by mouth daily.     magnesium oxide (MAG-OX) 400 (241.3 Mg) MG tablet Take 1  tablet (400 mg total) by mouth 3 (three) times daily. 90 tablet 3   nitrofurantoin, macrocrystal-monohydrate, (MACROBID) 100 MG capsule TAKE (1) CAPSULE BY MOUTH AT BEDTIME. 90 capsule 0   NOVOLOG FLEXPEN 100 UNIT/ML FlexPen Inject into the skin.     oxyCODONE-acetaminophen (PERCOCET/ROXICET) 5-325 MG tablet Take 1 tablet by mouth 2 (two) times daily as needed for severe pain. 10 tablet 0   oxyCODONE-acetaminophen (PERCOCET/ROXICET) 5-325 MG tablet One tablet every four hours as need for pain,  5 day limit. 25 tablet 0   pantoprazole (PROTONIX) 40 MG tablet TAKE (1) TABLET BY MOUTH ONCE DAILY. 90 tablet 0   pregabalin (LYRICA) 200 MG capsule TAKE (1) CAPSULE BY MOUTH TWICE DAILY. 60 capsule 11   Propylene Glycol (SYSTANE BALANCE) 0.6 % SOLN Apply 1 drop to eye daily as needed (dry eye).      rosuvastatin (CRESTOR) 5 MG tablet Take 5 mg by mouth daily.     scopolamine (TRANSDERM-SCOP) 1 MG/3DAYS Place 1 patch (1.5 mg total) onto the skin every 3 (three) days. 10 patch 12   valACYclovir (VALTREX) 1000 MG tablet TAKE 2 TABLETS BY MOUTH NOW; THEN 2 12 HOURS LATER. 12 tablet 0   VELCADE 3.5 MG injection RECONSTITUTE EACH VIAL AS DIRECTED. INJECT 2.5 MG UNDER THE SKIN EVERY 14 DAYS. 2 each 2   metFORMIN (GLUCOPHAGE) 500 MG tablet Take 500 mg by mouth 2 (two) times daily.     No current facility-administered medications for this visit.   Facility-Administered Medications Ordered in Other Visits  Medication Dose Route Frequency Provider Last Rate Last Admin   sodium chloride flush (NS) 0.9 % injection 10 mL  10 mL Intracatheter PRN Derek Jack, MD   10 mL at 04/05/22 1213    ALLERGIES:  No Known Allergies  PHYSICAL EXAM:  Performance status (ECOG): 1 - Symptomatic but completely ambulatory  There were no vitals filed for this visit. Wt Readings from Last 3 Encounters:  04/05/22 252 lb 3.2 oz (114.4 kg)  03/29/22 258 lb 9.6 oz (117.3 kg)  03/29/22 258 lb 9.6 oz (117.3 kg)   Physical  Exam Vitals reviewed.  Constitutional:      Appearance: Normal appearance. She is obese.  Cardiovascular:     Rate and Rhythm: Normal rate and regular rhythm.     Pulses: Normal pulses.     Heart sounds: Normal heart sounds.  Pulmonary:     Effort: Pulmonary effort is normal.     Breath sounds: Normal breath sounds.  Neurological:     General: No focal deficit present.     Mental Status: She is alert and oriented to person, place, and time.  Psychiatric:        Mood and Affect: Mood normal.        Behavior: Behavior normal.     LABORATORY DATA:  I have reviewed the labs as listed.     Latest Ref Rng & Units 04/05/2022    8:54 AM 03/29/2022   12:35 PM 03/22/2022   10:03 AM  CBC  WBC 4.0 - 10.5 K/uL 5.4  6.1  5.4   Hemoglobin 12.0 - 15.0 g/dL 7.6  8.4  7.7   Hematocrit 36.0 -  46.0 % 24.2  26.6  25.2   Platelets 150 - 400 K/uL 137  63  91       Latest Ref Rng & Units 04/05/2022    8:54 AM 03/29/2022   12:35 PM 03/22/2022   10:03 AM  CMP  Glucose 70 - 99 mg/dL 175  175  160   BUN 6 - 20 mg/dL 17  19  20    Creatinine 0.44 - 1.00 mg/dL 1.06  0.89  1.04   Sodium 135 - 145 mmol/L 134  134  136   Potassium 3.5 - 5.1 mmol/L 4.0  4.0  3.7   Chloride 98 - 111 mmol/L 102  103  104   CO2 22 - 32 mmol/L 23  22  23    Calcium 8.9 - 10.3 mg/dL 8.5  8.5  8.1   Total Protein 6.5 - 8.1 g/dL 6.1  6.1  6.1   Total Bilirubin 0.3 - 1.2 mg/dL 0.9  0.8  0.8   Alkaline Phos 38 - 126 U/L 121  137  119   AST 15 - 41 U/L 20  25  22    ALT 0 - 44 U/L 16  17  16      DIAGNOSTIC IMAGING:  I have independently reviewed the scans and discussed with the patient. MR CERVICAL SPINE W WO CONTRAST  Result Date: 03/30/2022 CLINICAL DATA:  Multiple myeloma. Left shoulder and upper extremity pain and numbness. EXAM: MRI CERVICAL SPINE WITHOUT AND WITH CONTRAST TECHNIQUE: Multiplanar and multiecho pulse sequences of the cervical spine, to include the craniocervical junction and cervicothoracic junction, were  obtained without and with intravenous contrast. CONTRAST:  10 cc Vueway COMPARISON:  Cervical spine radiographs 03/27/2022. CTA of the neck 12/26/2019. FINDINGS: Alignment: Straightening without focal angulation or listhesis. Vertebrae: No evidence of acute fracture or traumatic subluxation. Heterogeneous marrow signal with grossly stable predominately sclerotic lesion in the left aspect of the C5 vertebral body compared with previous CTA. Nonspecific decreased T1 and increased T2 signal within the C6 and C7 vertebral bodies with low level enhancement following contrast. There is also a small enhancing lesion within the T2 vertebral body (best seen on sagittal image 10/11). Cord: Normal in signal and caliber. No abnormal intradural enhancement. Posterior Fossa, vertebral arteries, paraspinal tissues: Visualized portions of the posterior fossa appear unremarkable.Bilateral vertebral artery flow voids. No significant paraspinal findings. Disc levels: No significant disc space findings at or above C3-4. C4-5: Chronic spondylosis with loss of disc height and posterior osteophytes covering diffusely bulging disc material. The CSF surrounding the cord is partially effaced, but there is no cord deformity. Moderate foraminal narrowing bilaterally. C5-6: Mild disc bulging and uncinate spurring. Mild foraminal narrowing bilaterally. C6-7: Mild disc bulging and uncinate spurring. The spinal canal and foramina are patent. C7-T1: No significant findings. IMPRESSION: 1. No acute cervical spine findings. 2. Spondylosis as described, greatest at C4-5 where there is mild resulting spinal stenosis and moderate foraminal narrowing bilaterally. Lesser spondylosis at C5-6 with mild foraminal narrowing bilaterally. 3. Nonspecific marrow heterogeneity with foci of enhancement at C6, C7 and T2. The findings in the cervical spine are suspected to be discogenic given adjacent disc disease. Consider follow-up in approximately 6 months, as  clinically warranted. No acute osseous findings. Electronically Signed   By: Richardean Sale M.D.   On: 03/30/2022 11:58   CT Angio Chest PE W and/or Wo Contrast  Result Date: 03/29/2022 CLINICAL DATA:  Suspected pulmonary embolus. EXAM: CT ANGIOGRAPHY CHEST WITH CONTRAST TECHNIQUE: Multidetector CT  imaging of the chest was performed using the standard protocol during bolus administration of intravenous contrast. Multiplanar CT image reconstructions and MIPs were obtained to evaluate the vascular anatomy. RADIATION DOSE REDUCTION: This exam was performed according to the departmental dose-optimization program which includes automated exposure control, adjustment of the mA and/or kV according to patient size and/or use of iterative reconstruction technique. CONTRAST:  23m OMNIPAQUE IOHEXOL 350 MG/ML SOLN COMPARISON:  None Available. FINDINGS: Cardiovascular: Satisfactory opacification of the pulmonary arteries to the segmental level. No evidence of pulmonary embolism. Normal heart size. No pericardial effusion. Mediastinum/Nodes: No enlarged mediastinal, hilar, or axillary lymph nodes. Thyroid gland, trachea, and esophagus demonstrate no significant findings. Lungs/Pleura: Scattered atelectatic changes, otherwise clear. Upper Abdomen: No acute abnormality. Musculoskeletal: Sclerotic changes, remodeling and compression deformity of T6 vertebral body. Small lytic lesions in multiple other thoracic vertebral bodies without pathologic fracture. Review of the MIP images confirms the above findings. IMPRESSION: 1. No evidence of pulmonary embolus. 2. Sclerotic changes, remodeling and compression deformity of T6 vertebral body, with small lytic lesions in multiple other thoracic vertebral bodies without pathologic fracture. These are likely sequela multiple myeloma. Electronically Signed   By: DFidela SalisburyM.D.   On: 03/29/2022 16:00   UKoreaVenous Img Upper Uni Left (DVT)  Result Date: 03/29/2022 CLINICAL  DATA:  56year old female with left arm pain and numbness EXAM: LEFT UPPER EXTREMITY VENOUS DOPPLER ULTRASOUND TECHNIQUE: Gray-scale sonography with graded compression, as well as color Doppler and duplex ultrasound were performed to evaluate the upper extremity deep venous system from the level of the subclavian vein and including the jugular, axillary, basilic, radial, ulnar and upper cephalic vein. Spectral Doppler was utilized to evaluate flow at rest and with distal augmentation maneuvers. COMPARISON:  None Available. FINDINGS: Contralateral Subclavian Vein: Respiratory phasicity is normal and symmetric with the symptomatic side. No evidence of thrombus. Normal compressibility. Internal Jugular Vein: No evidence of thrombus. Normal compressibility, respiratory phasicity and response to augmentation. Subclavian Vein: No evidence of thrombus. Normal compressibility, respiratory phasicity and response to augmentation. Axillary Vein: No evidence of thrombus. Normal compressibility, respiratory phasicity and response to augmentation. Cephalic Vein: No evidence of thrombus. Normal compressibility, respiratory phasicity and response to augmentation. Basilic Vein: No evidence of thrombus. Normal compressibility, respiratory phasicity and response to augmentation. Brachial Veins: No evidence of thrombus. Normal compressibility, respiratory phasicity and response to augmentation. Radial Veins: No evidence of thrombus. Normal compressibility, respiratory phasicity and response to augmentation. Ulnar Veins: No evidence of thrombus. Normal compressibility, respiratory phasicity and response to augmentation. Other Findings:  None visualized. IMPRESSION: Directed duplex of the left upper extremity negative for DVT Signed, JDulcy Fanny WNadene Rubins RPVI [facility] Center[facility] Electronically Signed   By: JCorrie MckusickD.O.   On: 03/29/2022 15:27   DG Cervical Spine  Complete  Result Date: 03/27/2022 Clinical:  left sided paresthesias, no trauma, history of multiple myeloma X-rays were done of the cervical spine, five views. There is loss of the normal lordosis of the neck.  There are degenerative changes of lower cervical spine with narrowing at C5-C5, C5-C6 and C6-C7 with small anterior osteophytes.  No fracture is noted. Bone quality is good. Impression:  diffuse lower cervical spine degenerative changes, no acute findings. Electronically Signed WSanjuana Kava MD 9/19/202311:33 AM    ASSESSMENT:  1.  IgG lambda multiple myeloma, stage III, del 17 p: -4 cycles of RVD from 11/01/2017 through 01/14/2018. -[facility]  Velcade every 2 weeks and Revlimid 10 mg 3 weeks on/1 week off started on 07/29/2018. -Myeloma panel from 12/08/2018 shows M spike not observed.  Kappa light chains of 32.1 with ratio of 1.64.  Immunofixation was negative. 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  MRI of the brain on 12/27/2019 shows acute ischemic nonhemorrhagic left MCA territory infarct involving left parietal lobe, corresponding with perfusion deficit on CT scan angiogram.  No associated hemorrhage or mass-effect.  Additional few scattered punctate acute ischemic nonhemorrhagic infarcts involving bilateral frontal and parietal lobes as well as left cerebellum. - Revlimid was on hold since June 2021 due to potential for contributing to CVA. - Velcade dose was reduced to 1 mg per metered square on 12/28/2020 due to worsening neuropathy in the feet.,  Pomalidomide 2 mg 3 weeks on/1 week off started around 09/20/2021, discontinued on 01/17/2022 due to progression. - Daratumumab, carfilzomib and dexamethasone (DKd) started on   2.  CVA with aphasia: -We held her myeloma treatments since CVA with aphasia on 12/26/2019. -Her aphasia is improving.   PLAN:  1.  IgG lambda multiple myeloma, stage III, del 17 p: - She missed day 22 of cycle 2 last week  because of left upper extremity pain and was sent to ER for evaluation of cardiac etiology.  I have reviewed records from ER visit.  CT angiogram on 03/29/2022 with no evidence of pulmonary embolus and some sclerotic changes at T6 vertebral body and small lytic lesions in the thoracic vertebral bodies consistent with myeloma. - MRI of the C-spine on 03/30/2022 showed spondylosis with mild spinal stenosis at C4-5 and moderate foraminal narrowing bilaterally.  Some disc disease was seen at C6, C7 and T2. - She reports improvement in the left upper extremity pain. - Reviewed labs today which showed hemoglobin 7.6 and platelet count 137.  LFTs were grossly normal with albumin low at 2.6.  Creatinine was 1.06. - She will proceed with cycle 3-day 1 of daratumumab and carfilzomib.  We will give her 2 more doses of weekly daratumumab as she missed them during cycle 1 and 2.  We have sent myeloma panel today.  She will come back in 4 weeks for follow-up with me.   2.  Hypomagnesemia: - Continue magnesium 3 times daily.  Magnesium today is 1.6.   3.  Bone strengthening agents: - Continue Zometa every 4 weeks.  Calcium is 8.5.   4.  Peripheral neuropathy: - Continue Lyrica 200 mg twice daily.   5.  ID prophylaxis: - Can continue acyclovir twice daily.   6.  Diabetes: - Continue metformin daily and Trulicity weekly.   7.  Recurrent UTIs: - Continue Macrobid 100 mg daily for prophylaxis.   Orders placed this encounter:  Orders Placed This Encounter  Procedures   Kappa/lambda light chains   Protein electrophoresis, serum      Derek Jack, MD [place] [phone]

## 2022-04-05 NOTE — Patient Instructions (Signed)
Joanna Reid  Discharge Instructions: Thank you for choosing [facility] to provide your oncology and hematology care.  If you have a lab appointment with the [facility], please come in thru the Main Entrance and check in at the main information desk.  Wear comfortable clothing and clothing appropriate for easy access to any Portacath or PICC line.   We strive to give you quality time with your provider. You may need to reschedule your appointment if you arrive late (15 or more minutes).  Arriving late affects you and other patients whose appointments are after yours.  Also, if you miss three or more appointments without notifying the office, you may be dismissed from the clinic at the provider's discretion.      For prescription refill requests, have your pharmacy contact our office and allow 72 hours for refills to be completed.    Today you received the following chemotherapy and/or immunotherapy agents Kyprolis, Daratumumab      To help prevent nausea and vomiting after your treatment, we encourage you to take your nausea medication as directed.  BELOW ARE SYMPTOMS THAT SHOULD BE REPORTED IMMEDIATELY: *FEVER GREATER THAN 100.4 F (38 C) OR HIGHER *CHILLS OR SWEATING *NAUSEA AND VOMITING THAT IS NOT CONTROLLED WITH YOUR NAUSEA MEDICATION *UNUSUAL SHORTNESS OF BREATH *UNUSUAL BRUISING OR BLEEDING *URINARY PROBLEMS (pain or burning when urinating, or frequent urination) *BOWEL PROBLEMS (unusual diarrhea, constipation, pain near the anus) TENDERNESS IN MOUTH AND THROAT WITH OR WITHOUT PRESENCE OF ULCERS (sore throat, sores in mouth, or a toothache) UNUSUAL RASH, SWELLING OR PAIN  UNUSUAL VAGINAL DISCHARGE OR ITCHING   Items with * indicate a potential emergency and should be followed up as soon as possible or go to the Emergency Department if any problems should occur.  Please show the CHEMOTHERAPY ALERT CARD or IMMUNOTHERAPY ALERT CARD at check-in to  the Emergency Department and triage nurse.  Should you have questions after your visit or need to cancel or reschedule your appointment, please contact [facility] [phone]  and follow the prompts.  Office hours are 8:00 a.m. to 4:30 p.m. Monday - Friday. Please note that voicemails left after 4:00 p.m. may not be returned until the following business day.  We are closed weekends and major holidays. You have access to a nurse at all times for urgent questions. Please call the main number to the clinic [phone] and follow the prompts.  For any non-urgent questions, you may also contact your provider using MyChart. We now offer e-Visits for anyone 26 and older to request care online for non-urgent symptoms. For details visit [email or web address].   Also download the MyChart app! Go to the app store, search "MyChart", open the app, select [facility], and log in with your MyChart username and password.  Masks are optional in the cancer centers. If you would like for your care team to wear a mask while they are taking care of you, please let them know. You may have one support person who is at least 57 years old accompany you for your appointments.

## 2022-04-06 LAB — KAPPA/LAMBDA LIGHT CHAINS
Kappa free light chain: 5.9 mg/L (ref 3.3–19.4)
Kappa, lambda light chain ratio: 0.01 — ABNORMAL LOW (ref 0.26–1.65)
Lambda free light chains: 1107 mg/L — ABNORMAL HIGH (ref 5.7–26.3)

## 2022-04-09 LAB — PROTEIN ELECTROPHORESIS, SERUM
A/G Ratio: 1 (ref 0.7–1.7)
Albumin ELP: 2.7 g/dL — ABNORMAL LOW (ref 2.9–4.4)
Alpha-1-Globulin: 0.4 g/dL (ref 0.0–0.4)
Alpha-2-Globulin: 0.9 g/dL (ref 0.4–1.0)
Beta Globulin: 0.7 g/dL (ref 0.7–1.3)
Gamma Globulin: 0.7 g/dL (ref 0.4–1.8)
Globulin, Total: 2.7 g/dL (ref 2.2–3.9)
M-Spike, %: 0.3 g/dL — ABNORMAL HIGH
Total Protein ELP: 5.4 g/dL — ABNORMAL LOW (ref 6.0–8.5)

## 2022-04-10 ENCOUNTER — Ambulatory Visit (INDEPENDENT_AMBULATORY_CARE_PROVIDER_SITE_OTHER): Payer: BC Managed Care – PPO | Admitting: Orthopaedic Surgery

## 2022-04-10 ENCOUNTER — Encounter: Payer: Self-pay | Admitting: Orthopaedic Surgery

## 2022-04-10 DIAGNOSIS — R5381 Other malaise: Secondary | ICD-10-CM | POA: Diagnosis not present

## 2022-04-10 DIAGNOSIS — M79602 Pain in left arm: Secondary | ICD-10-CM | POA: Diagnosis not present

## 2022-04-10 DIAGNOSIS — M79601 Pain in right arm: Secondary | ICD-10-CM | POA: Diagnosis not present

## 2022-04-10 NOTE — Patient Instructions (Addendum)
Speak to your doctor about the sweating you are having. As the weather changes and turns cooler, your joint pain may increase. This is normal.   Physical therapy has been ordered for you at [facility]. They should call you to schedule, [phone] is the phone number to call, if you want to call to schedule.  They are on [address] in that strip across from [employer].

## 2022-04-10 NOTE — Progress Notes (Signed)
I don't feel well today.  She is perspiring profusely today. She has cancer treatment this past Thursday, September 28 for her multiple myeloma.    She had her MRI of her neck which showed: IMPRESSION: 1. No acute cervical spine findings. 2. Spondylosis as described, greatest at C4-5 where there is mild resulting spinal stenosis and moderate foraminal narrowing bilaterally. Lesser spondylosis at C5-6 with mild foraminal narrowing bilaterally. 3. Nonspecific marrow heterogeneity with foci of enhancement at C6, C7 and T2. The findings in the cervical spine are suspected to be discogenic given adjacent disc disease. Consider follow-up in approximately 6 months, as clinically warranted. No acute osseous findings.  I have explained the findings to her.  I have independently reviewed the MRI.    She has some arm pain but less.  ROM of the left shoulder is tender.  NV intact.  Encounter Diagnoses  Name Primary?   Left arm pain Yes   Right arm pain    Physical deconditioning    I will get PT to see her for her deconditioning and recommend exercises and program to help there.  I will have her contact the cancer center for the sweating she is having.  Her temp is 99.7 today.  Return in six week.  Call if any problem.  Precautions discussed.  Electronically Signed Sanjuana Kava, MD 10/3/202310:18 AM

## 2022-04-10 NOTE — Addendum Note (Signed)
Addended by: Obie Dredge A on: 04/10/2022 11:12 AM   Modules accepted: Orders

## 2022-04-11 LAB — IMMUNOFIXATION ELECTROPHORESIS
IgA: 9 mg/dL — ABNORMAL LOW (ref 87–352)
IgG (Immunoglobin G), Serum: 727 mg/dL (ref 586–1602)
IgM (Immunoglobulin M), Srm: 17 mg/dL — ABNORMAL LOW (ref 26–217)
Total Protein ELP: 5.1 g/dL — ABNORMAL LOW (ref 6.0–8.5)

## 2022-04-12 ENCOUNTER — Emergency Department (HOSPITAL_COMMUNITY): Payer: BC Managed Care – PPO

## 2022-04-12 ENCOUNTER — Encounter (HOSPITAL_COMMUNITY): Payer: Self-pay | Admitting: *Deleted

## 2022-04-12 ENCOUNTER — Other Ambulatory Visit: Payer: Self-pay

## 2022-04-12 ENCOUNTER — Inpatient Hospital Stay: Payer: BC Managed Care – PPO

## 2022-04-12 ENCOUNTER — Inpatient Hospital Stay (HOSPITAL_COMMUNITY)
Admission: EM | Admit: 2022-04-12 | Discharge: 2022-04-16 | DRG: 840 | Disposition: A | Discharge status: Home / Self Care | Payer: BC Managed Care – PPO | Source: Ambulatory Visit | Attending: Family Medicine | Admitting: Family Medicine

## 2022-04-12 ENCOUNTER — Inpatient Hospital Stay: Payer: BC Managed Care – PPO | Attending: Hematology

## 2022-04-12 DIAGNOSIS — Z7982 Long term (current) use of aspirin: Secondary | ICD-10-CM

## 2022-04-12 DIAGNOSIS — M109 Gout, unspecified: Secondary | ICD-10-CM | POA: Diagnosis present

## 2022-04-12 DIAGNOSIS — J209 Acute bronchitis, unspecified: Secondary | ICD-10-CM | POA: Diagnosis not present

## 2022-04-12 DIAGNOSIS — I10 Essential (primary) hypertension: Secondary | ICD-10-CM | POA: Diagnosis present

## 2022-04-12 DIAGNOSIS — R0602 Shortness of breath: Secondary | ICD-10-CM | POA: Diagnosis not present

## 2022-04-12 DIAGNOSIS — D649 Anemia, unspecified: Secondary | ICD-10-CM | POA: Insufficient documentation

## 2022-04-12 DIAGNOSIS — C9002 Multiple myeloma in relapse: Secondary | ICD-10-CM | POA: Diagnosis not present

## 2022-04-12 DIAGNOSIS — M79605 Pain in left leg: Secondary | ICD-10-CM | POA: Diagnosis present

## 2022-04-12 DIAGNOSIS — C9000 Multiple myeloma not having achieved remission: Secondary | ICD-10-CM

## 2022-04-12 DIAGNOSIS — E872 Acidosis, unspecified: Secondary | ICD-10-CM | POA: Diagnosis present

## 2022-04-12 DIAGNOSIS — U071 COVID-19: Secondary | ICD-10-CM | POA: Diagnosis present

## 2022-04-12 DIAGNOSIS — D61818 Other pancytopenia: Secondary | ICD-10-CM | POA: Diagnosis present

## 2022-04-12 DIAGNOSIS — T451X5A Adverse effect of antineoplastic and immunosuppressive drugs, initial encounter: Secondary | ICD-10-CM | POA: Diagnosis present

## 2022-04-12 DIAGNOSIS — Z7984 Long term (current) use of oral hypoglycemic drugs: Secondary | ICD-10-CM

## 2022-04-12 DIAGNOSIS — R042 Hemoptysis: Secondary | ICD-10-CM | POA: Diagnosis present

## 2022-04-12 DIAGNOSIS — Z79899 Other long term (current) drug therapy: Secondary | ICD-10-CM

## 2022-04-12 DIAGNOSIS — Z825 Family history of asthma and other chronic lower respiratory diseases: Secondary | ICD-10-CM

## 2022-04-12 DIAGNOSIS — D84821 Immunodeficiency due to drugs: Secondary | ICD-10-CM | POA: Diagnosis present

## 2022-04-12 DIAGNOSIS — Z8249 Family history of ischemic heart disease and other diseases of the circulatory system: Secondary | ICD-10-CM

## 2022-04-12 DIAGNOSIS — Z87891 Personal history of nicotine dependence: Secondary | ICD-10-CM

## 2022-04-12 DIAGNOSIS — M79604 Pain in right leg: Secondary | ICD-10-CM | POA: Diagnosis present

## 2022-04-12 DIAGNOSIS — N3281 Overactive bladder: Secondary | ICD-10-CM | POA: Diagnosis present

## 2022-04-12 DIAGNOSIS — J208 Acute bronchitis due to other specified organisms: Secondary | ICD-10-CM | POA: Diagnosis present

## 2022-04-12 DIAGNOSIS — Z833 Family history of diabetes mellitus: Secondary | ICD-10-CM

## 2022-04-12 DIAGNOSIS — Z87442 Personal history of urinary calculi: Secondary | ICD-10-CM

## 2022-04-12 DIAGNOSIS — Z9484 Stem cells transplant status: Secondary | ICD-10-CM

## 2022-04-12 DIAGNOSIS — E860 Dehydration: Secondary | ICD-10-CM | POA: Diagnosis present

## 2022-04-12 DIAGNOSIS — Z6841 Body Mass Index (BMI) 40.0 and over, adult: Secondary | ICD-10-CM

## 2022-04-12 DIAGNOSIS — E1169 Type 2 diabetes mellitus with other specified complication: Secondary | ICD-10-CM | POA: Diagnosis present

## 2022-04-12 DIAGNOSIS — Z8673 Personal history of transient ischemic attack (TIA), and cerebral infarction without residual deficits: Secondary | ICD-10-CM

## 2022-04-12 DIAGNOSIS — I639 Cerebral infarction, unspecified: Secondary | ICD-10-CM | POA: Diagnosis present

## 2022-04-12 LAB — CBC WITH DIFFERENTIAL/PLATELET
Abs Immature Granulocytes: 0.1 10*3/uL — ABNORMAL HIGH (ref 0.00–0.07)
Abs Immature Granulocytes: 0.08 10*3/uL — ABNORMAL HIGH (ref 0.00–0.07)
Basophils Absolute: 0 10*3/uL (ref 0.0–0.1)
Basophils Absolute: 0 10*3/uL (ref 0.0–0.1)
Basophils Relative: 1 %
Basophils Relative: 0 %
Eosinophils Absolute: 0 10*3/uL (ref 0.0–0.5)
Eosinophils Absolute: 0 10*3/uL (ref 0.0–0.5)
Eosinophils Relative: 1 %
Eosinophils Relative: 0 %
HCT: 23.9 % — ABNORMAL LOW (ref 36.0–46.0)
HCT: 19.7 % — ABNORMAL LOW (ref 36.0–46.0)
Hemoglobin: 7.4 g/dL — ABNORMAL LOW (ref 12.0–15.0)
Hemoglobin: 6.1 g/dL — CL (ref 12.0–15.0)
Immature Granulocytes: 2 %
Immature Granulocytes: 2 %
Lymphocytes Relative: 13 %
Lymphocytes Relative: 17 %
Lymphs Abs: 0.9 10*3/uL (ref 0.7–4.0)
Lymphs Abs: 0.8 10*3/uL (ref 0.7–4.0)
MCH: 28.9 pg (ref 26.0–34.0)
MCH: 28.9 pg (ref 26.0–34.0)
MCHC: 31 g/dL (ref 30.0–36.0)
MCHC: 31 g/dL (ref 30.0–36.0)
MCV: 93.4 fL (ref 80.0–100.0)
MCV: 93.4 fL (ref 80.0–100.0)
Monocytes Absolute: 1.3 10*3/uL — ABNORMAL HIGH (ref 0.1–1.0)
Monocytes Absolute: 1 10*3/uL (ref 0.1–1.0)
Monocytes Relative: 20 %
Monocytes Relative: 21 %
Neutro Abs: 4.2 10*3/uL (ref 1.7–7.7)
Neutro Abs: 2.9 10*3/uL (ref 1.7–7.7)
Neutrophils Relative %: 63 %
Neutrophils Relative %: 60 %
Platelets: 71 10*3/uL — ABNORMAL LOW (ref 150–400)
Platelets: 55 10*3/uL — ABNORMAL LOW (ref 150–400)
RBC: 2.56 MIL/uL — ABNORMAL LOW (ref 3.87–5.11)
RBC: 2.11 MIL/uL — ABNORMAL LOW (ref 3.87–5.11)
RDW: 19.9 % — ABNORMAL HIGH (ref 11.5–15.5)
RDW: 20.1 % — ABNORMAL HIGH (ref 11.5–15.5)
WBC: 6.5 10*3/uL (ref 4.0–10.5)
WBC: 4.8 10*3/uL (ref 4.0–10.5)
nRBC: 1.4 % — ABNORMAL HIGH (ref 0.0–0.2)
nRBC: 1.2 % — ABNORMAL HIGH (ref 0.0–0.2)

## 2022-04-12 LAB — COMPREHENSIVE METABOLIC PANEL
ALT: 16 U/L (ref 0–44)
ALT: 13 U/L (ref 0–44)
AST: 35 U/L (ref 15–41)
AST: 26 U/L (ref 15–41)
Albumin: 2.6 g/dL — ABNORMAL LOW (ref 3.5–5.0)
Albumin: 2.3 g/dL — ABNORMAL LOW (ref 3.5–5.0)
Alkaline Phosphatase: 171 U/L — ABNORMAL HIGH (ref 38–126)
Alkaline Phosphatase: 140 U/L — ABNORMAL HIGH (ref 38–126)
Anion gap: 10 (ref 5–15)
Anion gap: 5 (ref 5–15)
BUN: 23 mg/dL — ABNORMAL HIGH (ref 6–20)
BUN: 22 mg/dL — ABNORMAL HIGH (ref 6–20)
CO2: 21 mmol/L — ABNORMAL LOW (ref 22–32)
CO2: 22 mmol/L (ref 22–32)
Calcium: 8.2 mg/dL — ABNORMAL LOW (ref 8.9–10.3)
Calcium: 7.6 mg/dL — ABNORMAL LOW (ref 8.9–10.3)
Chloride: 107 mmol/L (ref 98–111)
Chloride: 108 mmol/L (ref 98–111)
Creatinine, Ser: 0.99 mg/dL (ref 0.44–1.00)
Creatinine, Ser: 0.87 mg/dL (ref 0.44–1.00)
GFR, Estimated: >60 mL/min (ref >60)
GFR, Estimated: >60 mL/min (ref >60)
Glucose, Bld: 213 mg/dL — ABNORMAL HIGH (ref 70–99)
Glucose, Bld: 128 mg/dL — ABNORMAL HIGH (ref 70–99)
Potassium: 4.6 mmol/L (ref 3.5–5.1)
Potassium: 4.4 mmol/L (ref 3.5–5.1)
Sodium: 138 mmol/L (ref 135–145)
Sodium: 135 mmol/L (ref 135–145)
Total Bilirubin: 1.1 mg/dL (ref 0.3–1.2)
Total Bilirubin: 0.6 mg/dL (ref 0.3–1.2)
Total Protein: 6.1 g/dL — ABNORMAL LOW (ref 6.5–8.1)
Total Protein: 5.3 g/dL — ABNORMAL LOW (ref 6.5–8.1)

## 2022-04-12 LAB — LACTIC ACID, PLASMA
Lactic Acid, Venous: 2.2 mmol/L (ref 0.5–1.9)
Lactic Acid, Venous: 3.2 mmol/L (ref 0.5–1.9)

## 2022-04-12 LAB — TROPONIN I (HIGH SENSITIVITY)
Troponin I (High Sensitivity): 5 ng/L (ref <18)
Troponin I (High Sensitivity): 5 ng/L (ref <18)

## 2022-04-12 LAB — SAMPLE TO BLOOD BANK

## 2022-04-12 LAB — URINALYSIS, ROUTINE W REFLEX MICROSCOPIC
Bacteria, UA: NONE SEEN
Bilirubin Urine: NEGATIVE
Glucose, UA: NEGATIVE mg/dL
Hgb urine dipstick: NEGATIVE
Ketones, ur: NEGATIVE mg/dL
Leukocytes,Ua: NEGATIVE
Nitrite: NEGATIVE
Protein, ur: 100 mg/dL — AB
Specific Gravity, Urine: 1.04 — ABNORMAL HIGH (ref 1.005–1.030)
pH: 6 (ref 5.0–8.0)

## 2022-04-12 LAB — PROTIME-INR
INR: 1.2 (ref 0.8–1.2)
Prothrombin Time: 15 seconds (ref 11.4–15.2)

## 2022-04-12 LAB — LACTATE DEHYDROGENASE: LDH: 556 U/L — ABNORMAL HIGH (ref 98–192)

## 2022-04-12 LAB — RESP PANEL BY RT-PCR (FLU A&B, COVID) ARPGX2
Influenza A by PCR: NEGATIVE
Influenza B by PCR: NEGATIVE
SARS Coronavirus 2 by RT PCR: POSITIVE — AB

## 2022-04-12 LAB — MAGNESIUM: Magnesium: 1.6 mg/dL — ABNORMAL LOW (ref 1.7–2.4)

## 2022-04-12 LAB — D-DIMER, QUANTITATIVE: D-Dimer, Quant: 1.12 ug{FEU}/mL — ABNORMAL HIGH (ref 0.00–0.50)

## 2022-04-12 MED ORDER — PANTOPRAZOLE SODIUM 40 MG PO TBEC
40.0000 mg | DELAYED_RELEASE_TABLET | Freq: Every day | ORAL | Status: DC
  Administered 2022-04-13 – 2022-04-16 (×4): 40 mg via ORAL
  Filled 2022-04-12 (×4): qty 1

## 2022-04-12 MED ORDER — CARVEDILOL 12.5 MG PO TABS
25.0000 mg | ORAL_TABLET | Freq: Two times a day (BID) | ORAL | Status: DC
  Administered 2022-04-13 – 2022-04-16 (×8): 25 mg via ORAL
  Filled 2022-04-12 (×8): qty 2

## 2022-04-12 MED ORDER — ONDANSETRON HCL 4 MG/2ML IJ SOLN
4.0000 mg | Freq: Once | INTRAMUSCULAR | Status: DC
  Filled 2022-04-12: qty 2

## 2022-04-12 MED ORDER — LACTATED RINGERS IV BOLUS
1000.0000 mL | Freq: Once | INTRAVENOUS | Status: AC
  Administered 2022-04-12: 1000 mL via INTRAVENOUS

## 2022-04-12 MED ORDER — SODIUM CHLORIDE 0.9 % IV SOLN
12.5000 mg | Freq: Once | INTRAVENOUS | Status: DC
  Filled 2022-04-12: qty 0.5

## 2022-04-12 MED ORDER — SODIUM CHLORIDE 0.9 % IV SOLN
500.0000 mg | INTRAVENOUS | Status: DC
  Administered 2022-04-12: 500 mg via INTRAVENOUS
  Filled 2022-04-12: qty 5

## 2022-04-12 MED ORDER — LORAZEPAM 2 MG/ML IJ SOLN
1.0000 mg | Freq: Once | INTRAMUSCULAR | Status: AC
  Administered 2022-04-12: 1 mg via INTRAVENOUS
  Filled 2022-04-12: qty 1

## 2022-04-12 MED ORDER — ALLOPURINOL 100 MG PO TABS
300.0000 mg | ORAL_TABLET | Freq: Every day | ORAL | Status: DC
  Administered 2022-04-13 – 2022-04-16 (×4): 300 mg via ORAL
  Filled 2022-04-12 (×4): qty 3

## 2022-04-12 MED ORDER — SODIUM CHLORIDE 0.9 % IV SOLN
1.0000 g | Freq: Once | INTRAVENOUS | Status: AC
  Administered 2022-04-12: 1 g via INTRAVENOUS
  Filled 2022-04-12: qty 10

## 2022-04-12 MED ORDER — SODIUM CHLORIDE 0.9 % IV SOLN: INTRAVENOUS | Status: AC

## 2022-04-12 MED ORDER — INSULIN ASPART 100 UNIT/ML IJ SOLN
0.0000 [IU] | Freq: Every day | INTRAMUSCULAR | Status: DC
  Administered 2022-04-13: 3 [IU] via SUBCUTANEOUS
  Administered 2022-04-14: 2 [IU] via SUBCUTANEOUS
  Administered 2022-04-15: 5 [IU] via SUBCUTANEOUS

## 2022-04-12 MED ORDER — PROMETHAZINE HCL 12.5 MG PO TABS: 12.5000 mg | ORAL_TABLET | Freq: Four times a day (QID) | ORAL | Status: DC | PRN

## 2022-04-12 MED ORDER — POLYETHYLENE GLYCOL 3350 17 G PO PACK: 17.0000 g | PACK | Freq: Every day | ORAL | Status: DC | PRN

## 2022-04-12 MED ORDER — MAGNESIUM SULFATE 2 GM/50ML IV SOLN
2.0000 g | Freq: Once | INTRAVENOUS | Status: AC
  Administered 2022-04-13: 2 g via INTRAVENOUS
  Filled 2022-04-12: qty 50

## 2022-04-12 MED ORDER — IPRATROPIUM-ALBUTEROL 0.5-2.5 (3) MG/3ML IN SOLN: 3.0000 mL | RESPIRATORY_TRACT | Status: DC | PRN

## 2022-04-12 MED ORDER — AMLODIPINE BESYLATE 5 MG PO TABS
2.5000 mg | ORAL_TABLET | Freq: Every day | ORAL | Status: DC
  Administered 2022-04-13 – 2022-04-16 (×4): 2.5 mg via ORAL
  Filled 2022-04-12 (×4): qty 1

## 2022-04-12 MED ORDER — INSULIN ASPART 100 UNIT/ML IJ SOLN
0.0000 [IU] | Freq: Three times a day (TID) | INTRAMUSCULAR | Status: DC
  Administered 2022-04-13 (×2): 3 [IU] via SUBCUTANEOUS
  Administered 2022-04-13: 5 [IU] via SUBCUTANEOUS
  Administered 2022-04-14: 3 [IU] via SUBCUTANEOUS
  Administered 2022-04-14: 5 [IU] via SUBCUTANEOUS
  Administered 2022-04-14 – 2022-04-15 (×2): 8 [IU] via SUBCUTANEOUS
  Administered 2022-04-15 (×2): 5 [IU] via SUBCUTANEOUS
  Administered 2022-04-16: 3 [IU] via SUBCUTANEOUS
  Administered 2022-04-16: 5 [IU] via SUBCUTANEOUS
  Administered 2022-04-16: 3 [IU] via SUBCUTANEOUS

## 2022-04-12 MED ORDER — ALBUTEROL SULFATE HFA 108 (90 BASE) MCG/ACT IN AERS
2.0000 | INHALATION_SPRAY | Freq: Four times a day (QID) | RESPIRATORY_TRACT | Status: DC
  Administered 2022-04-13 – 2022-04-14 (×8): 2 via RESPIRATORY_TRACT
  Filled 2022-04-12: qty 6.7

## 2022-04-12 MED ORDER — ACETAMINOPHEN 325 MG PO TABS
650.0000 mg | ORAL_TABLET | Freq: Once | ORAL | Status: AC
  Administered 2022-04-12: 650 mg via ORAL
  Filled 2022-04-12: qty 2

## 2022-04-12 MED ORDER — PREGABALIN 75 MG PO CAPS
200.0000 mg | ORAL_CAPSULE | Freq: Two times a day (BID) | ORAL | Status: DC
  Administered 2022-04-13 – 2022-04-16 (×8): 200 mg via ORAL
  Filled 2022-04-12 (×8): qty 1

## 2022-04-12 MED ORDER — VALACYCLOVIR HCL 500 MG PO TABS
1000.0000 mg | ORAL_TABLET | Freq: Two times a day (BID) | ORAL | Status: DC
  Administered 2022-04-13: 1000 mg via ORAL
  Filled 2022-04-12 (×2): qty 2

## 2022-04-12 MED ORDER — ACYCLOVIR 800 MG PO TABS
400.0000 mg | ORAL_TABLET | Freq: Two times a day (BID) | ORAL | Status: DC
  Administered 2022-04-13 – 2022-04-16 (×8): 400 mg via ORAL
  Filled 2022-04-12 (×8): qty 1

## 2022-04-12 MED ORDER — ESCITALOPRAM OXALATE 10 MG PO TABS
10.0000 mg | ORAL_TABLET | Freq: Every day | ORAL | Status: DC
  Administered 2022-04-13 – 2022-04-16 (×4): 10 mg via ORAL
  Filled 2022-04-12 (×4): qty 1

## 2022-04-12 MED ORDER — NIRMATRELVIR/RITONAVIR (PAXLOVID)TABLET
3.0000 | ORAL_TABLET | Freq: Two times a day (BID) | ORAL | Status: DC
  Administered 2022-04-13 – 2022-04-16 (×8): 3 via ORAL
  Filled 2022-04-12: qty 30

## 2022-04-12 MED ORDER — SODIUM CHLORIDE 0.9% IV SOLUTION: Freq: Once | INTRAVENOUS | Status: DC

## 2022-04-12 MED ORDER — IOHEXOL 350 MG/ML SOLN
75.0000 mL | Freq: Once | INTRAVENOUS | Status: AC | PRN
  Administered 2022-04-12: 75 mL via INTRAVENOUS

## 2022-04-12 MED ORDER — ROSUVASTATIN CALCIUM 10 MG PO TABS: 5.0000 mg | ORAL_TABLET | Freq: Every day | ORAL | Status: DC

## 2022-04-12 MED ORDER — IPRATROPIUM-ALBUTEROL 0.5-2.5 (3) MG/3ML IN SOLN
3.0000 mL | Freq: Once | RESPIRATORY_TRACT | Status: AC
  Administered 2022-04-12: 3 mL via RESPIRATORY_TRACT
  Filled 2022-04-12: qty 3

## 2022-04-12 NOTE — Assessment & Plan Note (Signed)
Follows with Dr. Delton Coombes.  Status post stem cell transplant 2019. -Currently on immunotherapy with daratumumab and carfilzomib.

## 2022-04-12 NOTE — ED Notes (Addendum)
Pt requested to be put on O2. Pt is coughing at this time and states it is hard to catch her breath. Pt also took BP cuff off due to complaints of being hot. Checked pt's temp. 99.1

## 2022-04-12 NOTE — Assessment & Plan Note (Signed)
Hemoglobin down to 6.1, over the past month baseline has ranged from 7-8.  She also has thrombocytopenia of 71, baseline 63-248 over the past month> Likely secondary to chemotherapy. -Recent anemia panel 02/08/2022 with normal ferritin, serum iron, TIBC and borderline low iron saturation suggestive of anemia of chronic disease. -Transfuse 1 unit PRBC

## 2022-04-12 NOTE — H&P (Signed)
History and Physical    Joanna Reid NID:[id number] DOB: January 01, 1966 DOA: 04/12/2022  PCP: Lindell Spar, MD   Patient coming from: Home  I have personally briefly reviewed patient's old medical records in [facility]  Chief Complaint: Difficulty breathing  HPI: Joanna Reid is a 56 y.o. female with medical history significant for multiple myeloma, obesity, gout, CVA, overactive bladder, diabetes mellitus. Patient was sent to the ED from cancer center with reports of shortness of breath, wheezing and low hemoglobin. Patient reports difficulty breathing over the past week, with associated wheezing.  She reports generalized weakness, and cough.  Reports few episodes of blood-tinged sputum.  She reports fever at home.  No lower extremity swelling.  No chest pain.  She is on chemotherapy for multiple myeloma.  Reports some episodes of vomiting, no abdominal pain and no diarrhea. Quit smoking cigarettes about 3 years ago. Patient reports she just recovered from a bout of COVID infection on 8 August.  She had a cough, mild fever at that time.  No dyspnea or wheezing.  She did not require hospitalization.  She was treated with Paxlovid.  She got at least 3 doses of COVID-19 vaccine.  ED Course: Tmax 100.3, heart rate 80-97.  Respirate rate 17-26.  Blood pressure systolic 1 14-431.  O2 sats greater than 93% on room air. Lactic acidosis 2.2 >  3.2. Hemoglobin 6.1, baseline over the past week 7.4-8.4. CTA chest negative for PE, shows right upper lobe linear atelectasis, compression fracture deformity at C6, lytic lesions T6 and T7. IV ceftriaxone and azithromycin started.  2 L bolus given.  DuoNebs given.  Hospitalist to admit for anemia, COVID infection, bronchitis.  Review of Systems: As per HPI all other systems reviewed and negative.  Past Medical History:  Diagnosis Date   Acid reflux    Allergic rhinitis    Cancer ([place])    multiple myeloma   Diabetes mellitus    type 2    Gout    Gout    H/o COVID-19--- was Positive 09/17/2019, Negative 12/23/19 AND also Neg on  12/26/19 12/26/2019   HBP (high blood pressure)    History of kidney stones    Migraines    Multiple myeloma ([facility]) 10/29/2017    Past Surgical History:  Procedure Laterality Date   BREAST CYST EXCISION Left    2009 no visible scar on skin   CESAREAN SECTION     COLONOSCOPY WITH PROPOFOL N/A 12/25/2019   Procedure: COLONOSCOPY WITH PROPOFOL;  Surgeon: Rogene Houston, MD;  Location: [facility];  Service: Endoscopy;  Laterality: N/A;  730   EXTRACORPOREAL SHOCK WAVE LITHOTRIPSY Left 10/10/2017   Procedure: LEFT EXTRACORPOREAL SHOCK WAVE LITHOTRIPSY (ESWL);  Surgeon: Bjorn Loser, MD;  Location: [facility] ORS;  Service: Urology;  Laterality: Left;   EYE SURGERY     HEMORRHOID SURGERY N/A 11/19/2012   Procedure: HEMORRHOIDECTOMY;  Surgeon: Jamesetta So, MD;  Location: [facility] ORS;  Service: General;  Laterality: N/A;   IR IMAGING GUIDED PORT INSERTION  01/18/2022   kidney stones  1998   LAPAROSCOPIC UNILATERAL SALPINGO OOPHERECTOMY  05/14/2012   Procedure: LAPAROSCOPIC UNILATERAL SALPINGO OOPHORECTOMY;  Surgeon: [name], MD;  Location: [facility] OR[facility];  Service: Gynecology;  Laterality: Right;  laparoscopic right salpingo-oophorectomy   PARTIAL HYSTERECTOMY     POLYPECTOMY  12/25/2019   Procedure: POLYPECTOMY;  Surgeon: Rogene Houston, MD;  Location: [facility];  Service: Endoscopy;;   TONSILECTOMY, ADENOIDECTOMY, BILATERAL MYRINGOTOMY AND  TUBES     VESICOVAGINAL FISTULA CLOSURE W/ TAH       reports that she quit smoking about 23 years ago. Her smoking use included cigarettes. She has never used smokeless tobacco. She reports that she does not drink alcohol and does not use drugs.  No Known Allergies  Family History  Problem Relation Age of Onset   Arthritis Other    Cancer Other    Diabetes Other    Hypertension Mother    Dementia Mother    Diabetes Father    ALS Father    Diabetes Brother     Hypertension Brother    Cancer Paternal Aunt    COPD Maternal Grandmother    Cancer Maternal Grandfather    Anesthesia problems Paternal Grandfather     Prior to Admission medications   Medication Sig Start Date End Date Taking? Authorizing Provider  acyclovir (ZOVIRAX) 400 MG tablet TAKE (1) TABLET BY MOUTH TWICE DAILY. 03/01/22  Yes Derek Jack, MD  albuterol (VENTOLIN HFA) 108 (90 Base) MCG/ACT inhaler Inhale 2 puffs into the lungs every 4 (four) hours as needed for wheezing or shortness of breath. 11/30/20  Yes Lovena Le, Malena M, DO  allopurinol (ZYLOPRIM) 300 MG tablet TAKE (1) TABLET BY MOUTH ONCE DAILY. Patient taking differently: Take 300 mg by mouth daily. 01/23/22  Yes Lindell Spar, MD  amLODipine (NORVASC) 2.5 MG tablet Take 2.5 mg by mouth daily. 01/27/21  Yes [provider]  aspirin EC 81 MG tablet Take 1 tablet (81 mg total) by mouth daily. 12/26/19  Yes Rehman, Mechele Dawley, MD  bumetanide (BUMEX) 0.5 MG tablet Take 0.5 mg by mouth daily.  06/30/19  Yes [provider]  carvedilol (COREG) 25 MG tablet Take 1 tablet (25 mg total) by mouth 2 (two) times daily. 12/29/19  Yes Thurnell Lose, MD  DARZALEX FASPRO 1800-30000 MG-UT/15ML SOLN Inject 15 ml over 3-5 minutes subcutaneously once every 2 weeks. 03/27/22  Yes Derek Jack, MD  diclofenac Sodium (VOLTAREN) 1 % GEL Apply 2 g topically 4 (four) times daily. 05/01/21  Yes Caccavale, Sophia, PA-C  docusate sodium (COLACE) 100 MG capsule Take 100 mg by mouth daily.    Yes [provider]  Dulaglutide 1.5 MG/0.5ML SOPN Inject 1.5 mg into the skin every 7 (seven) days.   Yes [provider]  escitalopram (LEXAPRO) 10 MG tablet TAKE (1) TABLET BY MOUTH ONCE DAILY. MAY START WITH 1/2 TABLET FOR 7 DAYS. Patient taking differently: Take 10 mg by mouth daily. 01/23/22  Yes Lindell Spar, MD  fluticasone [facility]) 50 MCG/ACT nasal spray INSTILL 2 SPRAYS INTO BOTH NOSTRILS DAILY 06/28/21  Yes  Lindell Spar, MD  Insulin Lispro w/ Trans Port 100 UNIT/ML SOPN Inject 20 Units into the skin See admin instructions. Inject before meals per sliding scale Max TDD 20 units 01/31/22  Yes [provider]  KYPROLIS 30 MG SOLR Inject into the vein. 01/29/22  Yes [provider]  lidocaine-prilocaine (EMLA) cream Apply 1 Application topically as needed (Apply to port prior to treatment and flushes). 01/24/22  Yes Derek Jack, MD  LORazepam (ATIVAN) 0.5 MG tablet Take 1 tablet (0.5 mg total) by mouth every 8 (eight) hours. 03/22/22  Yes Dede Query T, PA-C  losartan (COZAAR) 100 MG tablet Take 100 mg by mouth daily.   Yes [provider]  magnesium oxide (MAG-OX) 400 (241.3 Mg) MG tablet Take 1 tablet (400 mg total) by mouth 3 (three) times daily. 10/02/18  Yes Derek Jack, MD  metFORMIN (GLUCOPHAGE) 500 MG tablet Take 500 mg by mouth 2 (two) times daily. 11/16/21 04/12/22 Yes [provider]  nitrofurantoin, macrocrystal-monohydrate, (MACROBID) 100 MG capsule TAKE (1) CAPSULE BY MOUTH AT BEDTIME. Patient taking differently: Take 100 mg by mouth at bedtime. 03/14/22  Yes Derek Jack, MD  oxyCODONE-acetaminophen (PERCOCET/ROXICET) 5-325 MG tablet Take 1 tablet by mouth 2 (two) times daily as needed for severe pain. 03/26/22  Yes Volney American, PA-C  pantoprazole (PROTONIX) 40 MG tablet TAKE (1) TABLET BY MOUTH ONCE DAILY. Patient taking differently: Take 40 mg by mouth daily. 01/17/22  Yes Lindell Spar, MD  pregabalin (LYRICA) 200 MG capsule TAKE (1) CAPSULE BY MOUTH TWICE DAILY. Patient taking differently: Take 200 mg by mouth 2 (two) times daily. 01/25/22  Yes Derek Jack, MD  Propylene Glycol (SYSTANE BALANCE) 0.6 % SOLN Apply 1 drop to eye daily as needed (dry eye).    Yes [provider]  rosuvastatin (CRESTOR) 5 MG tablet Take 5 mg by mouth daily. 10/17/20  Yes [provider]  scopolamine (TRANSDERM-SCOP)  1 MG/3DAYS Place 1 patch (1.5 mg total) onto the skin every 3 (three) days. 03/14/22  Yes Derek Jack, MD  valACYclovir (VALTREX) 1000 MG tablet TAKE 2 TABLETS BY MOUTH NOW; THEN 2 12 HOURS LATER. Patient taking differently: Take 1,000 mg by mouth See admin instructions. TAKE 2 TABLETS BY MOUTH NOW; THEN 2 12 HOURS LATER. 12/19/21  Yes Derek Jack, MD  BD PEN NEEDLE NANO U/F 32G X 4 MM MISC Inject into the skin. 01/31/22   [provider]  cyclobenzaprine (FLEXERIL) 5 MG tablet Take 1 tablet (5 mg total) by mouth 3 (three) times daily as needed for muscle spasms. Do not drink alcohol or drive while taking this medication.  May cause drowsiness. Patient not taking: Reported on 04/12/2022 03/26/22   Volney American, PA-C  Lactulose 20 GM/30ML SOLN Take 30 mLs (20 g total) by mouth daily. Patient not taking: Reported on 04/12/2022 09/27/21   Derek Jack, MD  NOVOLOG FLEXPEN 100 UNIT/ML FlexPen Inject into the skin. Patient not taking: Reported on 04/12/2022 01/31/22   [provider]  oxyCODONE-acetaminophen (PERCOCET/ROXICET) 5-325 MG tablet One tablet every four hours as need for pain,  5 day limit. Patient not taking: Reported on 04/12/2022 03/27/22   Sanjuana Kava, MD    Physical Exam: Vitals:   04/12/22 2015 04/12/22 2030 04/12/22 2100 04/12/22 2136  BP:  (!) 137/91 128/72   Pulse:  93 91   Resp:  (!) 23 (!) 23   Temp:    99.1 F (37.3 C)  TempSrc:    Oral  SpO2: 97% 94% 93%   Weight:      Height:        Constitutional: NAD, calm, comfortable Vitals:   04/12/22 2015 04/12/22 2030 04/12/22 2100 04/12/22 2136  BP:  (!) 137/91 128/72   Pulse:  93 91   Resp:  (!) 23 (!) 23   Temp:    99.1 F (37.3 C)  TempSrc:    Oral  SpO2: 97% 94% 93%   Weight:      Height:       Eyes: PERRL, lids and conjunctivae normal ENMT: Mucous membranes are moist.   Neck: normal, supple, no masses, no thyromegaly Respiratory: Diffuse expiratory wheezing,  normal respiratory effort, no accessory muscle use.   Cardiovascular: Regular rate and rhythm, no murmurs / rubs / gallops. No extremity edema.  Lower extremities  warm. Abdomen: no tenderness, no masses palpated. No hepatosplenomegaly. Bowel sounds positive.  Musculoskeletal: no clubbing / cyanosis. No joint deformity upper and lower extremities.  Skin: no rashes, lesions, ulcers. No induration Neurologic: No apparent cranial nerve abnormality, neck Smitty spontaneously. Psychiatric: Normal judgment and insight. Alert and oriented x 3. Normal mood.   Labs on Admission: I have personally reviewed following labs and imaging studies  CBC: Recent Labs  Lab 04/12/22 1158 04/12/22 2011  WBC 6.5 4.8  NEUTROABS 4.2 2.9  HGB 7.4* 6.1*  HCT 23.9* 19.7*  MCV 93.4 93.4  PLT 71* 55*   Basic Metabolic Panel: Recent Labs  Lab 04/12/22 1158 04/12/22 2011  NA 138 135  K 4.6 4.4  CL 107 108  CO2 21* 22  GLUCOSE 213* 128*  BUN 23* 22*  CREATININE 0.99 0.87  CALCIUM 8.2* 7.6*  MG 1.6*  --    GFR: Estimated Creatinine Clearance: 88.1 mL/min (by C-G formula based on SCr of 0.87 mg/dL). Liver Function Tests: Recent Labs  Lab 04/12/22 1158 04/12/22 2011  AST 35 26  ALT 16 13  ALKPHOS 171* 140*  BILITOT 1.1 0.6  PROT 6.1* 5.3*  ALBUMIN 2.6* 2.3*   Coagulation Profile: Recent Labs  Lab 04/12/22 1449  INR 1.2   Urine analysis:    Component Value Date/Time   COLORURINE YELLOW 04/12/2022 2107   APPEARANCEUR CLEAR 04/12/2022 2107   LABSPEC 1.040 (H) 04/12/2022 2107   PHURINE 6.0 04/12/2022 2107   GLUCOSEU NEGATIVE 04/12/2022 2107   HGBUR NEGATIVE 04/12/2022 2107   BILIRUBINUR NEGATIVE 04/12/2022 2107   BILIRUBINUR negative 11/27/2021 1534   BILIRUBINUR +++ 08/20/2018 [place] 04/12/2022 2107   PROTEINUR 100 (A) 04/12/2022 2107   UROBILINOGEN 0.2 11/27/2021 1534   UROBILINOGEN 0.2 05/07/2012 0829   NITRITE NEGATIVE 04/12/2022 2107   LEUKOCYTESUR NEGATIVE  04/12/2022 2107    Radiological Exams on Admission: CT Angio Chest PE W and/or Wo Contrast  Result Date: 04/12/2022 CLINICAL DATA:  Shortness of breath and wheezing. EXAM: CT ANGIOGRAPHY CHEST WITH CONTRAST TECHNIQUE: Multidetector CT imaging of the chest was performed using the standard protocol during bolus administration of intravenous contrast. Multiplanar CT image reconstructions and MIPs were obtained to evaluate the vascular anatomy. RADIATION DOSE REDUCTION: This exam was performed according to the departmental dose-optimization program which includes automated exposure control, adjustment of the mA and/or kV according to patient size and/or use of iterative reconstruction technique. CONTRAST:  88m OMNIPAQUE IOHEXOL 350 MG/ML SOLN COMPARISON:  March 29, 2022 FINDINGS: Cardiovascular: A right-sided venous Port-A-Cath is in place. There is mild calcification of the descending thoracic aorta, without evidence of aortic aneurysm. The subsegmental pulmonary arteries are limited in evaluation secondary to suboptimal opacification with intravenous contrast. No evidence of pulmonary embolism. Normal heart size with marked severity coronary artery calcification. No pericardial effusion. Mediastinum/Nodes: No enlarged mediastinal, hilar, or axillary lymph nodes. Thyroid gland, trachea, and esophagus demonstrate no significant findings. Lungs/Pleura: Mild lingular and mild anteromedial right upper lobe linear atelectasis is seen. There is no evidence of acute infiltrate, pleural effusion or pneumothorax. Upper Abdomen: There is diffuse fatty infiltration of the liver parenchyma. Musculoskeletal: A chronic compression fracture deformity is seen at the level of T6 vertebral body. Sclerotic changes are also seen within this region. Small lytic lesions are also seen at the level of T6 and T7. Review of the MIP images confirms the above findings. IMPRESSION: 1. No evidence of pulmonary embolism. 2. Mild lingular  and mild anteromedial  right upper lobe linear atelectasis. 3. Chronic compression fracture deformity at the level of T6 vertebral body. 4. Small lytic lesions at the level of T6 and T7. 5. Hepatic steatosis. 6. Aortic atherosclerosis. Aortic Atherosclerosis (ICD10-I70.0). Electronically Signed   By: Virgina Norfolk M.D.   On: 04/12/2022 19:59   DG Chest Port 1 View  Result Date: 04/12/2022 CLINICAL DATA:  Sepsis, short of breath, wheezing, anemia, multiple myeloma EXAM: PORTABLE CHEST 1 VIEW COMPARISON:  02/19/2022 FINDINGS: Single frontal view of the chest demonstrates stable right chest wall port. The cardiac silhouette is unremarkable. No airspace disease, effusion, or pneumothorax. No acute bony abnormality. IMPRESSION: 1. No acute intrathoracic process. Electronically Signed   By: Randa Ngo M.D.   On: 04/12/2022 15:26    EKG: Independently reviewed.  Sinus rhythm, rate 78, QTc 496.  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  She also has thrombocytopenia of 71, baseline 63-248 over the past month> Likely secondary to chemotherapy. -Recent anemia panel 02/08/2022 with normal ferritin, serum iron, TIBC and borderline low iron saturation suggestive of anemia of chronic disease. -Transfuse 1 unit PRBC  Multiple myeloma ([place]) Follows with Dr. Delton Coombes.  Status post stem cell transplant 2019. -Currently on immunotherapy with daratumumab and carfilzomib.  COVID-19 virus infection COVID-19 infection with acute bronchitis.  Not hypoxic.  Sats greater than 93% on room air.  -  CTA chest negative for PE, shows right upper lobe linear atelectasis, compression fracture deformity at C6, lytic lesions T6 and T7. -Paxlovid -Obtain and trend inflammatory markers -Lactic acid 2.2 > 3.2 likely from dehydration, she reports vomiting.  Trend for now. 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 -Resume aspirin, Crestor  Type 2 diabetes mellitus with other specified complication (HCC) Controlled.  A1c 6.6. -Hold dulaglutide, metformin, NovoLog - SSI- M  Essential hypertension, benign Stable. -Resume carvedilol, Norvasc -Hold losartan for contrast exposure    DVT prophylaxis: SCDS with thrombocytopenia Code Status: Full Family Communication: Spouse. [name],  And daughter Larene Beach at bedside Disposition Plan: ~ 2 days Consults called: None  Admission status:  Obs tele   Author: Bethena Roys, MD 04/12/2022 10:48 PM  For on call review [email or web address].

## 2022-04-12 NOTE — Assessment & Plan Note (Addendum)
COVID-19 infection with acute bronchitis.  Not hypoxic.  Sats greater than 93% on room air.  - CTA chest negative for PE, shows right upper lobe linear atelectasis, compression fracture deformity at C6, lytic lesions T6 and T7. -Paxlovid -Obtain and trend inflammatory markers -Lactic acid 2.2 > 3.2 likely from dehydration, she reports vomiting.  Trend for now. -Hold further antibiotics for now - HOld bumex - 2 l bolus given continue N/s 75cc/hr x 15hrs

## 2022-04-12 NOTE — Assessment & Plan Note (Signed)
Controlled.  A1c 6.6. -Hold dulaglutide, metformin, NovoLog - SSI- M

## 2022-04-12 NOTE — ED Provider Notes (Signed)
Care of patient being handed off to me by Ranell Patrick, PA-C at change of shift.  Please see his note for work-up and to this point.  Briefly this is a 56 year old female with history of multiple myeloma currently on chemotherapy who presents to the emergency department in instruction of oncologist for sepsis questionable pneumonia versus pulmonary embolism.   Physical Exam  BP 118/67   Pulse 97   Temp 100.3 F (37.9 C) (Oral)   Resp 19   Ht _0  (1.626 m)   Wt 113.4 kg   SpO2 97%   BMI 42.91 kg/m   Physical Exam Vitals and nursing note reviewed.  Constitutional:      General: She is in acute distress.     Appearance: Normal appearance. She is ill-appearing.  HENT:     Head: Normocephalic and atraumatic.     Mouth/Throat:     Pharynx: Oropharynx is clear.  Eyes:     General: No scleral icterus.    Extraocular Movements: Extraocular movements intact.  Cardiovascular:     Rate and Rhythm: Regular rhythm. Tachycardia present.     Pulses: Normal pulses.     Heart sounds: No murmur heard. Pulmonary:     Effort: Pulmonary effort is normal. Tachypnea present. No respiratory distress.     Breath sounds: Examination of the right-upper field reveals wheezing. Examination of the left-upper field reveals wheezing. Examination of the right-middle field reveals wheezing. Examination of the left-middle field reveals wheezing. Examination of the right-lower field reveals wheezing. Examination of the left-lower field reveals wheezing. Wheezing present.  Chest:     Chest wall: No tenderness.  Abdominal:     General: Bowel sounds are normal.     Palpations: Abdomen is soft.  Musculoskeletal:     Cervical back: Neck supple.  Skin:    General: Skin is warm and dry.     Capillary Refill: Capillary refill takes less than 2 seconds.  Neurological:     General: No focal deficit present.     Mental Status: She is alert and oriented to person, place, and time.  Psychiatric:        Mood and  Affect: Mood normal.        Behavior: Behavior normal.    Procedures  Procedures  ED Course / MDM   Clinical Course as of 04/12/22 2031  Thu Apr 12, 2022  2013 No PE. On my assessment significant wheezing. Ordered duoneb. Not hypoxic.  [LA]  2031 COVID-positive [LA]    Clinical Course User Index [LA] Mickie Hillier, PA-C   Medical Decision Making Amount and/or Complexity of Data Reviewed Labs: ordered. Radiology: ordered.  Risk OTC drugs. Prescription drug management. Decision regarding hospitalization.   At change of shift I added on a EKG, troponin, basic labs.  She has CMP and CBC done previously today but will repeated for admission. 2013: CTA PE study does not show any PE.  There is some right upper lobe and lingular atelectasis that I feel is more concerning for developing pneumonia rather than just atelectasis.  On my assessment she is also diffusely wheezing.  I ordered a DuoNeb.  She is not hypoxic but certainly has mild tachypnea and increased work of breathing. 2031: COVID-positive.  Likely the source of her symptoms.  CAP coverage was started prior to her shift handoff.  Patient will need admission for likely COVID-pneumonia in the setting of immunosuppression with active chemotherapy.  Consulted and spoke with Dr. Denton Brick who agrees to admit  patient.          Mickie Hillier, PA-C 04/12/22 2043    Fredia Sorrow, MD 04/17/22 1051

## 2022-04-12 NOTE — ED Triage Notes (Signed)
Pt went to cancer center and was sent down to ED for c/o sob, wheezing and low hemoglobin  Pt c/o sob x one week

## 2022-04-12 NOTE — ED Provider Notes (Signed)
Patient presents with chest pain shortness of breath and recent fever, sent from cancer clinic for admission.  On exam patient has low-grade temperature, oxygen is reasonable, minimally tachypneic, immunocompromise from underlying illness treatment and oncologic disorder.  Will need to be admitted to the hospital.  Medical screening examination/treatment/procedure(s) were conducted as a shared visit with non-physician practitioner(s) and myself.  I personally evaluated the patient during the encounter.  Clinical Impression:   Final diagnoses:  None     [name], MD 04/13/22 1242

## 2022-04-12 NOTE — Assessment & Plan Note (Addendum)
Stable. -Resume carvedilol, Norvasc -Hold losartan for contrast exposure

## 2022-04-12 NOTE — Assessment & Plan Note (Signed)
Stable. -Resume aspirin, Crestor

## 2022-04-12 NOTE — Progress Notes (Signed)
Patient presented today for treatment today. Patient came in today with noticeable bilateral wheezing. Sats are stable. Patient stated she is very weak, productive cough with blood tinged sputum. Patient does sweat a lot off and on since treatment started. MD notified and in room to evaluate.    Will hold treatment today, Labs drawn today and hemoglobin was 6.8 yesterday at [facility]. Will transfer patient to the emergency room per MD.Husband at bedside.   Report given to Renelda Loma RN in the Emergency Room.

## 2022-04-12 NOTE — ED Notes (Signed)
Patient transported to CT 

## 2022-04-12 NOTE — ED Provider Notes (Signed)
[facility] EMERGENCY DEPARTMENT Provider Note   CSN: [id number] Arrival date & time: 04/12/22  1252     History Chief Complaint  Patient presents with   Shortness of Breath    Joanna Reid is a 56 y.o. female with history of multiple myeloma who presents to the emergency department with fever, cough, and hemoptysis that is been ongoing for the last week.  Patient was seen evaluated at the cancer center today and sent to the ED by Dr. Delton Coombes for possible sepsis, pneumonia, possible pulmonary embolism.  Patient is currently undergoing chemotherapy.  She has been having cough with some hemoptysis and intermittent fevers over the last week.  She endorses associated generalized weakness and nausea but denies any vomiting, diarrhea, urinary symptoms.   Shortness of Breath      Home Medications Prior to Admission medications   Medication Sig Start Date End Date Taking? Authorizing Provider  acyclovir (ZOVIRAX) 400 MG tablet TAKE (1) TABLET BY MOUTH TWICE DAILY. 03/01/22   Derek Jack, MD  albuterol (VENTOLIN HFA) 108 (90 Base) MCG/ACT inhaler Inhale 2 puffs into the lungs every 4 (four) hours as needed for wheezing or shortness of breath. 11/30/20   Lovena Le, Malena M, DO  allopurinol (ZYLOPRIM) 300 MG tablet TAKE (1) TABLET BY MOUTH ONCE DAILY. 01/23/22   Lindell Spar, MD  amLODipine (NORVASC) 2.5 MG tablet Take 2.5 mg by mouth daily. 01/27/21   [provider]  aspirin EC 81 MG tablet Take 1 tablet (81 mg total) by mouth daily. 12/26/19   Rehman, Mechele Dawley, MD  BD PEN NEEDLE NANO U/F 32G X 4 MM MISC Inject into the skin. 01/31/22   [provider]  bumetanide (BUMEX) 0.5 MG tablet Take 0.5 mg by mouth daily.  06/30/19   [provider]  carvedilol (COREG) 25 MG tablet Take 1 tablet (25 mg total) by mouth 2 (two) times daily. 12/29/19   Thurnell Lose, MD  cyclobenzaprine (FLEXERIL) 5 MG tablet Take 1 tablet (5 mg total) by mouth 3 (three) times  daily as needed for muscle spasms. Do not drink alcohol or drive while taking this medication.  May cause drowsiness. 03/26/22   Volney American, PA-C  DARZALEX FASPRO 1800-30000 MG-UT/15ML SOLN Inject 15 ml over 3-5 minutes subcutaneously once every 2 weeks. 03/27/22   Derek Jack, MD  diclofenac Sodium (VOLTAREN) 1 % GEL Apply 2 g topically 4 (four) times daily. 05/01/21   Caccavale, Sophia, PA-C  dicyclomine (BENTYL) 20 MG tablet Take 1 tablet (20 mg total) by mouth 2 (two) times daily as needed for spasms. 05/01/21   Caccavale, Sophia, PA-C  Docosanol 10 % CREA Apply 1 application topically daily as needed (for nose).     [provider]  docusate sodium (COLACE) 100 MG capsule Take 100 mg by mouth daily.     [provider]  Dulaglutide 1.5 MG/0.5ML SOPN Inject 1.5 mg into the skin every 7 (seven) days.    [provider]  erythromycin ophthalmic ointment Place 1 application into the left eye at bedtime. 08/07/21   Lindell Spar, MD  escitalopram (LEXAPRO) 10 MG tablet TAKE (1) TABLET BY MOUTH ONCE DAILY. MAY START WITH 1/2 TABLET FOR 7 DAYS. 01/23/22   Lindell Spar, MD  fluticasone Asencion Islam) 50 MCG/ACT nasal spray INSTILL 2 SPRAYS INTO BOTH NOSTRILS DAILY 06/28/21   Lindell Spar, MD  Insulin Lispro w/ Trans Port 100 UNIT/ML SOPN Inject before meals per sliding scale. Max TDD  20 units 01/31/22   [provider]  KYPROLIS 30 MG SOLR Inject into the vein. 01/29/22   [provider]  KYPROLIS 60 MG SOLR INFUSE 40MG INTRAVENOUSLY FOR 1 DOSE 02/06/22   Derek Jack, MD  Lactulose 20 GM/30ML SOLN Take 30 mLs (20 g total) by mouth daily. 09/27/21   Derek Jack, MD  lidocaine-prilocaine (EMLA) cream Apply 1 Application topically as needed (Apply to port prior to treatment and flushes). 01/24/22   Derek Jack, MD  LORazepam (ATIVAN) 0.5 MG tablet Take 1 tablet (0.5 mg total) by mouth every 8 (eight) hours. 03/22/22    Lincoln Brigham, PA-C  losartan (COZAAR) 100 MG tablet Take 100 mg by mouth daily.    [provider]  magnesium oxide (MAG-OX) 400 (241.3 Mg) MG tablet Take 1 tablet (400 mg total) by mouth 3 (three) times daily. 10/02/18   Derek Jack, MD  metFORMIN (GLUCOPHAGE) 500 MG tablet Take 500 mg by mouth 2 (two) times daily. 11/16/21 03/01/22  [provider]  nitrofurantoin, macrocrystal-monohydrate, (MACROBID) 100 MG capsule TAKE (1) CAPSULE BY MOUTH AT BEDTIME. 03/14/22   Derek Jack, MD  NOVOLOG FLEXPEN 100 UNIT/ML FlexPen Inject into the skin. 01/31/22   [provider]  oxyCODONE-acetaminophen (PERCOCET/ROXICET) 5-325 MG tablet Take 1 tablet by mouth 2 (two) times daily as needed for severe pain. 03/26/22   Volney American, PA-C  oxyCODONE-acetaminophen (PERCOCET/ROXICET) 5-325 MG tablet One tablet every four hours as need for pain,  5 day limit. 03/27/22   Sanjuana Kava, MD  pantoprazole (PROTONIX) 40 MG tablet TAKE (1) TABLET BY MOUTH ONCE DAILY. 01/17/22   Lindell Spar, MD  pregabalin (LYRICA) 200 MG capsule TAKE (1) CAPSULE BY MOUTH TWICE DAILY. 01/25/22   Derek Jack, MD  Propylene Glycol (SYSTANE BALANCE) 0.6 % SOLN Apply 1 drop to eye daily as needed (dry eye).     [provider]  rosuvastatin (CRESTOR) 5 MG tablet Take 5 mg by mouth daily. 10/17/20   [provider]  scopolamine (TRANSDERM-SCOP) 1 MG/3DAYS Place 1 patch (1.5 mg total) onto the skin every 3 (three) days. 03/14/22   Derek Jack, MD  valACYclovir (VALTREX) 1000 MG tablet TAKE 2 TABLETS BY MOUTH NOW; THEN 2 12 HOURS LATER. 12/19/21   Derek Jack, MD  VELCADE 3.5 MG injection RECONSTITUTE EACH VIAL AS DIRECTED. INJECT 2.5 MG UNDER THE SKIN EVERY 14 DAYS. 01/22/22   Derek Jack, MD      Allergies    Patient has no known allergies.    Review of Systems   Review of Systems  Respiratory:  Positive for shortness of breath.   All other  systems reviewed and are negative.   Physical Exam Updated Vital Signs BP 129/86 (BP Location: Right Arm)   Pulse 81   Temp 99.5 F (37.5 C) (Oral)   Resp (!) 24   Ht _0  (1.626 m)   Wt 113.4 kg   SpO2 97%   BMI 42.91 kg/m  Physical Exam Vitals and nursing note reviewed.  Constitutional:      General: She is not in acute distress.    Appearance: Normal appearance.  HENT:     Head: Normocephalic and atraumatic.  Eyes:     General:        Right eye: No discharge.        Left eye: No discharge.  Cardiovascular:     Comments: Regular rate and rhythm.  S1/S2 are distinct without any evidence of murmur, rubs,  or gallops.  Radial pulses are 2+ bilaterally.  Dorsalis pedis pulses are 2+ bilaterally.  No evidence of pedal edema. Pulmonary:     Effort: Pulmonary effort is normal. Tachypnea present.     Breath sounds: Wheezing and rhonchi present.  Abdominal:     General: Abdomen is flat. Bowel sounds are normal. There is no distension.     Tenderness: There is no abdominal tenderness. There is no guarding or rebound.  Musculoskeletal:        General: Normal range of motion.     Cervical back: Neck supple.  Skin:    General: Skin is warm and dry.     Findings: No rash.  Neurological:     General: No focal deficit present.     Mental Status: She is alert.  Psychiatric:        Mood and Affect: Mood normal.        Behavior: Behavior normal.     ED Results / Procedures / Treatments   Labs (all labs ordered are listed, but only abnormal results are displayed) Labs Reviewed  CULTURE, BLOOD (ROUTINE X 2)  CULTURE, BLOOD (ROUTINE X 2)  LACTIC ACID, PLASMA  LACTIC ACID, PLASMA  PROTIME-INR  URINALYSIS, ROUTINE W REFLEX MICROSCOPIC  POC URINE PREG, ED    EKG None  Radiology No results found.  Procedures Procedures    Medications Ordered in ED Medications - No data to display  ED Course/ Medical Decision Making/ A&P                           Medical Decision  Making LATALIA ETZLER is a 56 y.o. female patient who presents to the emergency department today for further evaluation of fever, cough, and hemoptysis.  Patient is tachypneic and does have quite a bit of rhonchi diffusely.  She does have a low-grade temperature.  We will add on sepsis labs.  She did have CBC and CMP drawn at noon today.  We will also get a chest x-ray.  Chest x-ray was clear.  Patient did not have any evidence of leukocytosis but patient is immunosuppressed.  She does have a fever and is tachypneic.  CT angio to evaluate for pulmonary embolism is still pending.  Once this results the patient will likely need to be admitted for infectious etiology.  I have covered her with ceftriaxone and azithromycin.  Due to shift change the rest of her care will be transitioned to [facility] where ultimate disposition will be made.   Amount and/or Complexity of Data Reviewed Labs: ordered. Radiology: ordered.  Risk Prescription drug management.    Final Clinical Impression(s) / ED Diagnoses Final diagnoses:  None    Rx / DC Orders ED Discharge Orders     None         Cherrie Gauze 04/12/22 1914    [name], MD 04/13/22 1241

## 2022-04-13 ENCOUNTER — Observation Stay (HOSPITAL_COMMUNITY): Payer: BC Managed Care – PPO

## 2022-04-13 DIAGNOSIS — Z87891 Personal history of nicotine dependence: Secondary | ICD-10-CM | POA: Diagnosis not present

## 2022-04-13 DIAGNOSIS — D84821 Immunodeficiency due to drugs: Secondary | ICD-10-CM | POA: Diagnosis present

## 2022-04-13 DIAGNOSIS — J208 Acute bronchitis due to other specified organisms: Secondary | ICD-10-CM | POA: Diagnosis present

## 2022-04-13 DIAGNOSIS — Z79899 Other long term (current) drug therapy: Secondary | ICD-10-CM | POA: Diagnosis not present

## 2022-04-13 DIAGNOSIS — Z833 Family history of diabetes mellitus: Secondary | ICD-10-CM | POA: Diagnosis not present

## 2022-04-13 DIAGNOSIS — E872 Acidosis, unspecified: Secondary | ICD-10-CM | POA: Diagnosis present

## 2022-04-13 DIAGNOSIS — R042 Hemoptysis: Secondary | ICD-10-CM | POA: Diagnosis present

## 2022-04-13 DIAGNOSIS — N3281 Overactive bladder: Secondary | ICD-10-CM | POA: Diagnosis present

## 2022-04-13 DIAGNOSIS — D649 Anemia, unspecified: Secondary | ICD-10-CM | POA: Diagnosis not present

## 2022-04-13 DIAGNOSIS — C9000 Multiple myeloma not having achieved remission: Secondary | ICD-10-CM | POA: Diagnosis present

## 2022-04-13 DIAGNOSIS — R0602 Shortness of breath: Secondary | ICD-10-CM | POA: Diagnosis present

## 2022-04-13 DIAGNOSIS — M109 Gout, unspecified: Secondary | ICD-10-CM | POA: Diagnosis present

## 2022-04-13 DIAGNOSIS — Z8249 Family history of ischemic heart disease and other diseases of the circulatory system: Secondary | ICD-10-CM | POA: Diagnosis not present

## 2022-04-13 DIAGNOSIS — Z7982 Long term (current) use of aspirin: Secondary | ICD-10-CM | POA: Diagnosis not present

## 2022-04-13 DIAGNOSIS — Z9484 Stem cells transplant status: Secondary | ICD-10-CM | POA: Diagnosis not present

## 2022-04-13 DIAGNOSIS — Z87442 Personal history of urinary calculi: Secondary | ICD-10-CM | POA: Diagnosis not present

## 2022-04-13 DIAGNOSIS — Z6841 Body Mass Index (BMI) 40.0 and over, adult: Secondary | ICD-10-CM | POA: Diagnosis not present

## 2022-04-13 DIAGNOSIS — T451X5A Adverse effect of antineoplastic and immunosuppressive drugs, initial encounter: Secondary | ICD-10-CM | POA: Diagnosis present

## 2022-04-13 DIAGNOSIS — E1169 Type 2 diabetes mellitus with other specified complication: Secondary | ICD-10-CM | POA: Diagnosis present

## 2022-04-13 DIAGNOSIS — E860 Dehydration: Secondary | ICD-10-CM | POA: Diagnosis present

## 2022-04-13 DIAGNOSIS — I10 Essential (primary) hypertension: Secondary | ICD-10-CM | POA: Diagnosis present

## 2022-04-13 DIAGNOSIS — Z8673 Personal history of transient ischemic attack (TIA), and cerebral infarction without residual deficits: Secondary | ICD-10-CM | POA: Diagnosis not present

## 2022-04-13 DIAGNOSIS — Z825 Family history of asthma and other chronic lower respiratory diseases: Secondary | ICD-10-CM | POA: Diagnosis not present

## 2022-04-13 DIAGNOSIS — U071 COVID-19: Secondary | ICD-10-CM | POA: Diagnosis present

## 2022-04-13 DIAGNOSIS — D61818 Other pancytopenia: Secondary | ICD-10-CM | POA: Diagnosis present

## 2022-04-13 LAB — LACTIC ACID, PLASMA: Lactic Acid, Venous: 1.3 mmol/L (ref 0.5–1.9)

## 2022-04-13 LAB — CBC WITH DIFFERENTIAL/PLATELET
Abs Immature Granulocytes: 0.08 10*3/uL — ABNORMAL HIGH (ref 0.00–0.07)
Basophils Absolute: 0 10*3/uL (ref 0.0–0.1)
Basophils Relative: 0 %
Eosinophils Absolute: 0 10*3/uL (ref 0.0–0.5)
Eosinophils Relative: 0 %
HCT: 18.7 % — ABNORMAL LOW (ref 36.0–46.0)
Hemoglobin: 5.6 g/dL — CL (ref 12.0–15.0)
Immature Granulocytes: 2 %
Lymphocytes Relative: 18 %
Lymphs Abs: 0.9 10*3/uL (ref 0.7–4.0)
MCH: 28.6 pg (ref 26.0–34.0)
MCHC: 29.9 g/dL — ABNORMAL LOW (ref 30.0–36.0)
MCV: 95.4 fL (ref 80.0–100.0)
Monocytes Absolute: 1.1 10*3/uL — ABNORMAL HIGH (ref 0.1–1.0)
Monocytes Relative: 21 %
Neutro Abs: 3 10*3/uL (ref 1.7–7.7)
Neutrophils Relative %: 59 %
Platelets: 58 10*3/uL — ABNORMAL LOW (ref 150–400)
RBC: 1.96 MIL/uL — ABNORMAL LOW (ref 3.87–5.11)
RDW: 20 % — ABNORMAL HIGH (ref 11.5–15.5)
WBC: 5 10*3/uL (ref 4.0–10.5)
nRBC: 0.6 % — ABNORMAL HIGH (ref 0.0–0.2)

## 2022-04-13 LAB — COMPREHENSIVE METABOLIC PANEL
ALT: 13 U/L (ref 0–44)
AST: 22 U/L (ref 15–41)
Albumin: 2.3 g/dL — ABNORMAL LOW (ref 3.5–5.0)
Alkaline Phosphatase: 153 U/L — ABNORMAL HIGH (ref 38–126)
Anion gap: 9 (ref 5–15)
BUN: 20 mg/dL (ref 6–20)
CO2: 21 mmol/L — ABNORMAL LOW (ref 22–32)
Calcium: 7.9 mg/dL — ABNORMAL LOW (ref 8.9–10.3)
Chloride: 108 mmol/L (ref 98–111)
Creatinine, Ser: 0.76 mg/dL (ref 0.44–1.00)
GFR, Estimated: >60 mL/min (ref >60)
Glucose, Bld: 156 mg/dL — ABNORMAL HIGH (ref 70–99)
Potassium: 4.3 mmol/L (ref 3.5–5.1)
Sodium: 138 mmol/L (ref 135–145)
Total Bilirubin: 0.7 mg/dL (ref 0.3–1.2)
Total Protein: 5.2 g/dL — ABNORMAL LOW (ref 6.5–8.1)

## 2022-04-13 LAB — GLUCOSE, CAPILLARY
Glucose-Capillary: 139 mg/dL — ABNORMAL HIGH (ref 70–99)
Glucose-Capillary: 158 mg/dL — ABNORMAL HIGH (ref 70–99)
Glucose-Capillary: 162 mg/dL — ABNORMAL HIGH (ref 70–99)
Glucose-Capillary: 214 mg/dL — ABNORMAL HIGH (ref 70–99)
Glucose-Capillary: 296 mg/dL — ABNORMAL HIGH (ref 70–99)

## 2022-04-13 LAB — PREPARE RBC (CROSSMATCH)

## 2022-04-13 LAB — D-DIMER, QUANTITATIVE: D-Dimer, Quant: 1.48 ug/mL-FEU — ABNORMAL HIGH (ref 0.00–0.50)

## 2022-04-13 LAB — C-REACTIVE PROTEIN
CRP: 23.5 mg/dL — ABNORMAL HIGH (ref <1.0)
CRP: 25.7 mg/dL — ABNORMAL HIGH (ref <1.0)

## 2022-04-13 LAB — PHOSPHORUS: Phosphorus: 1.8 mg/dL — ABNORMAL LOW (ref 2.5–4.6)

## 2022-04-13 LAB — FERRITIN
Ferritin: >1500 ng/mL — ABNORMAL HIGH (ref 11–307)
Ferritin: 1379 ng/mL — ABNORMAL HIGH (ref 11–307)

## 2022-04-13 LAB — MAGNESIUM: Magnesium: 2.2 mg/dL (ref 1.7–2.4)

## 2022-04-13 LAB — MRSA NEXT GEN BY PCR, NASAL: MRSA by PCR Next Gen: NOT DETECTED

## 2022-04-13 LAB — HIV ANTIBODY (ROUTINE TESTING W REFLEX): HIV Screen 4th Generation wRfx: NONREACTIVE

## 2022-04-13 MED ORDER — METHYLPREDNISOLONE SODIUM SUCC 40 MG IJ SOLR
40.0000 mg | Freq: Two times a day (BID) | INTRAMUSCULAR | Status: AC
  Administered 2022-04-13 – 2022-04-15 (×6): 40 mg via INTRAVENOUS
  Filled 2022-04-13 (×6): qty 1

## 2022-04-13 MED ORDER — ASPIRIN 81 MG PO TBEC
81.0000 mg | DELAYED_RELEASE_TABLET | Freq: Every day | ORAL | Status: DC
  Administered 2022-04-13 – 2022-04-16 (×4): 81 mg via ORAL
  Filled 2022-04-13 (×4): qty 1

## 2022-04-13 MED ORDER — ORAL CARE MOUTH RINSE: 15.0000 mL | OROMUCOSAL | Status: DC | PRN

## 2022-04-13 MED ORDER — CHLORHEXIDINE GLUCONATE CLOTH 2 % EX PADS
6.0000 | MEDICATED_PAD | Freq: Every day | CUTANEOUS | Status: DC
  Administered 2022-04-13 – 2022-04-16 (×4): 6 via TOPICAL

## 2022-04-13 MED ORDER — OXYCODONE HCL 5 MG PO TABS
5.0000 mg | ORAL_TABLET | ORAL | Status: DC | PRN
  Administered 2022-04-13 – 2022-04-14 (×2): 5 mg via ORAL
  Filled 2022-04-13 (×2): qty 1

## 2022-04-13 NOTE — Progress Notes (Signed)
Informed by lab that patient has antibodies present in her blood. Unit for transfusion will have to come from another facility. Transfusion to be provided once blood has arrived.

## 2022-04-13 NOTE — Progress Notes (Signed)
  Transition of Care [facility]) Screening Note   Patient Details  Name: Joanna Reid Date of Birth: 06-03-1966   [facility] CM/SW Contact:    Ihor Gully, LCSW Phone Number: 04/13/2022, 11:40 AM    Transition of Care Department [facility]) has reviewed patient and no TOC needs have been identified at this time. We will continue to monitor patient advancement through interdisciplinary progression rounds. If new patient transition needs arise, please place a TOC consult.

## 2022-04-13 NOTE — Progress Notes (Signed)
Date and time results received: 04/13/22 0737 (use smartphrase ".now" to insert current time)  Test: CBC  Critical Value: HGB 5.6  Name of Provider Notified: Dr. Cruzita Lederer   Orders Received? Or Actions Taken?: No new orders at this time, patient has blood ordered,. However it is coming from a different facility

## 2022-04-13 NOTE — Progress Notes (Signed)
PROGRESS NOTE  Joanna Reid GMW:[id number] DOB: 17-Aug-1965 DOA: 04/12/2022 PCP: Lindell Spar, MD   LOS: 0 days   Brief Narrative / Interim history: 56 year old female with multiple myeloma, obesity, gout, prior CVA, overactive bladder, DM, who comes to the hospital with shortness of breath, wheezing as well as low hemoglobin.  She has been feeling fatigue and had difficulties breathing progressive over the last week.  She reports generalized weakness as well as a cough.  She is on chemotherapy for multiple myeloma.  Apparently she recovered from a bout of COVID infection in August 8, it was mild did not require hospitalization.  She was found to be febrile during this hospital admission, with a Tmax of 100.3, CT angio showed no evidence of PE but did show some lingular and anteromedial right upper lobe linear atelectasis.    Subjective / 24h Interval events: She feels fatigue, cough, congested.  Does not feel good at all.  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  She also has thrombocytopenia of 71, baseline 63-248 over the past month> Likely secondary to chemotherapy. Recent anemia panel 02/08/2022 with normal ferritin, serum iron, TIBC and borderline low iron saturation suggestive of anemia of chronic disease.  Patient is a parietal cells was ordered, however she has antibodies and needs to come from [place].  Still waiting this morning  Active problems Multiple myeloma (HCC) -Follows with Dr. Delton Coombes.  Status post stem cell transplant  2019. Currently on immunotherapy with daratumumab and carfilzomib.   COVID-19 virus infection -COVID-19 infection with acute bronchitis, has wheezing on exam.  She was recently infected just 2 months ago, she should not have been rechecked since within 90 days ... however she is immunosuppressed. She is on 2L but I do not see her being hypoxic. Wean to room air -Found to be dehydrated on admission, lactic acid mildly elevated, her Bumex was held and she was placed on fluids. -Was started on Paxlovid, continue for 5 days.  I called lab to obtain the CT value but I was told that they are allowed to give it out anymore. -Due to wheezing, start short course of steroids, 3 days  Bilateral leg pain -somewhat chronic, mainly in the knees. 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 Resume aspirin   Type 2 diabetes mellitus with other specified complication (HCC) -Controlled.  A1c 6.6.  Hold home medications, placed on sliding scale  CBG (last 3)  Recent Labs    04/13/22 0026 04/13/22 0804  GLUCAP 139* 162*   Essential hypertension, benign -Stable.  Scheduled Meds:  sodium chloride   Intravenous Once   acyclovir  400 mg Oral BID   albuterol  2 puff Inhalation Q6H   allopurinol  300 mg Oral Daily   amLODipine  2.5 mg Oral Daily   aspirin EC  81 mg Oral Daily   carvedilol  25 mg Oral BID   Chlorhexidine Gluconate Cloth  6 each Topical Daily  escitalopram  10 mg Oral Daily   insulin aspart  0-15 Units Subcutaneous TID WC   insulin aspart  0-5 Units Subcutaneous QHS   nirmatrelvir/ritonavir EUA  3 tablet Oral BID   pantoprazole  40 mg Oral Daily   pregabalin  200 mg Oral BID   Continuous Infusions:  sodium chloride 75 mL/hr at 04/13/22 0542   PRN Meds:.mouth rinse, oxyCODONE, polyethylene glycol, promethazine  Current Outpatient Medications  Medication Instructions   acyclovir (ZOVIRAX) 400  MG tablet TAKE (1) TABLET BY MOUTH TWICE DAILY.   albuterol (VENTOLIN HFA) 108 (90 Base) MCG/ACT inhaler 2 puffs, Inhalation, Every 4 hours PRN   allopurinol (ZYLOPRIM) 300 MG tablet TAKE (1) TABLET BY MOUTH ONCE DAILY.   amLODipine (NORVASC) 2.5 mg, Oral, Daily   aspirin EC 81 mg, Oral, Daily   BD PEN NEEDLE NANO U/F 32G X 4 MM MISC Subcutaneous   bumetanide (BUMEX) 0.5 mg, Oral, Daily   carvedilol (COREG) 25 mg, Oral, 2 times daily   cyclobenzaprine (FLEXERIL) 5 mg, Oral, 3 times daily PRN, Do not drink alcohol or drive while taking this medication.  May cause drowsiness.   DARZALEX FASPRO 1800-30000 MG-UT/15ML SOLN Inject 15 ml over 3-5 minutes subcutaneously once every 2 weeks.   diclofenac Sodium (VOLTAREN) 2 g, Topical, 4 times daily   docusate sodium (COLACE) 100 mg, Oral, Daily   Dulaglutide 1.5 mg, Subcutaneous, Every 7 days   escitalopram (LEXAPRO) 10 MG tablet TAKE (1) TABLET BY MOUTH ONCE DAILY. MAY START WITH 1/2 TABLET FOR 7 DAYS.   fluticasone (FLONASE) 50 MCG/ACT nasal spray INSTILL 2 SPRAYS INTO BOTH NOSTRILS DAILY   Insulin Lispro w/ Trans Port 20 Units, Subcutaneous, See admin instructions, Inject before meals per sliding scale Max TDD 20 units   KYPROLIS 30 MG SOLR Intravenous   Lactulose 20 g, Oral, Daily   lidocaine-prilocaine (EMLA) cream 1 Application, Topical, As needed   LORazepam (ATIVAN) 0.5 mg, Oral, Every 8 hours   losartan (COZAAR) 100 mg, Oral, Daily   magnesium oxide (MAG-OX) 400 mg, Oral, 3 times daily   metFORMIN (GLUCOPHAGE) 500 mg, Oral, 2 times daily   nitrofurantoin, macrocrystal-monohydrate, (MACROBID) 100 MG capsule TAKE (1) CAPSULE BY MOUTH AT BEDTIME.   NOVOLOG FLEXPEN 100 UNIT/ML FlexPen Inject into the skin.   oxyCODONE-acetaminophen (PERCOCET/ROXICET) 5-325 MG tablet 1 tablet, Oral, 2 times daily PRN   oxyCODONE-acetaminophen (PERCOCET/ROXICET) 5-325 MG tablet One tablet every four hours as need for pain,  5 day limit.   pantoprazole (PROTONIX)  40 MG tablet TAKE (1) TABLET BY MOUTH ONCE DAILY.   pregabalin (LYRICA) 200 MG capsule TAKE (1) CAPSULE BY MOUTH TWICE DAILY.   Propylene Glycol (SYSTANE BALANCE) 0.6 % SOLN 1 drop, Ophthalmic, Daily PRN   rosuvastatin (CRESTOR) 5 mg, Oral, Daily   scopolamine (TRANSDERM-SCOP) 1.5 mg, Transdermal, every 72 hours   valACYclovir (VALTREX) 1000 MG tablet TAKE 2 TABLETS BY MOUTH NOW; THEN 2 12 HOURS LATER.    Diet Orders (From admission, onward)     Start     Ordered   04/12/22 2349  Diet heart healthy/carb modified Room service appropriate? Yes; Fluid consistency: Thin  Diet effective now       Question Answer Comment  Diet-HS Snack? Nothing   Room service appropriate? Yes   Fluid consistency: Thin      04/12/22 2348            DVT prophylaxis: SCDs Start: 04/12/22 2349   Lab Results  Component Value Date  PLT 58 (L) 04/13/2022      Code Status: Full Code  Family Communication: Significant other at bedside  Status is: Observation  The patient will require care spanning > 2 midnights and should be moved to inpatient because: Remains symptomatic, low-grade temp, has significant anemia waiting for transfusion  Level of care: Telemetry  Consultants:  None   Objective: Vitals:   04/13/22 0023 04/13/22 0502 04/13/22 0700 04/13/22 1044  BP: 128/74 127/74 124/72   Pulse: 89 89 91   Resp: 18 18 18    Temp: 99.7 F (37.6 C) 98.1 F (36.7 C) 99.8 F (37.7 C)   TempSrc: Oral Oral Oral   SpO2: 100% 100% 98% 94%  Weight: 116 kg     Height: 5' 4"  (1.626 m)       Intake/Output Summary (Last 24 hours) at 04/13/2022 1116 Last data filed at 04/13/2022 0542 Gross per 24 hour  Intake 618.75 ml  Output --  Net 618.75 ml   Wt Readings from Last 3 Encounters:  04/13/22 116 kg  04/12/22 113.5 kg  04/05/22 114.4 kg    Examination:  Constitutional: NAD Eyes: no scleral icterus ENMT: Mucous membranes are moist.  Neck: normal, supple Respiratory: Diffuse end expiratory  wheezing. Cardiovascular: Regular rate and rhythm, no murmurs / rubs / gallops.  Trace LE edema.  Abdomen: non distended, no tenderness. Bowel sounds positive.  Musculoskeletal: no clubbing / cyanosis.  Skin: no rashes Neurologic: non focal   Data Reviewed: I have independently reviewed following labs and imaging studies   CBC Recent Labs  Lab 04/12/22 1158 04/12/22 2011 04/13/22 0301  WBC 6.5 4.8 5.0  HGB 7.4* 6.1* 5.6*  HCT 23.9* 19.7* 18.7*  PLT 71* 55* 58*  MCV 93.4 93.4 95.4  MCH 28.9 28.9 28.6  MCHC 31.0 31.0 29.9*  RDW 19.9* 20.1* 20.0*  LYMPHSABS 0.9 0.8 0.9  MONOABS 1.3* 1.0 1.1*  EOSABS 0.0 0.0 0.0  BASOSABS 0.0 0.0 0.0    Recent Labs  Lab 04/12/22 1158 04/12/22 1449 04/12/22 1654 04/12/22 2011 04/12/22 2225 04/13/22 0011 04/13/22 0301 04/13/22 0815  NA 138  --   --  135  --   --  138  --   K 4.6  --   --  4.4  --   --  4.3  --   CL 107  --   --  108  --   --  108  --   CO2 21*  --   --  22  --   --  21*  --   GLUCOSE 213*  --   --  128*  --   --  156*  --   BUN 23*  --   --  22*  --   --  20  --   CREATININE 0.99  --   --  0.87  --   --  0.76  --   CALCIUM 8.2*  --   --  7.6*  --   --  7.9*  --   AST 35  --   --  26  --   --  22  --   ALT 16  --   --  13  --   --  13  --   ALKPHOS 171*  --   --  140*  --   --  153*  --   BILITOT 1.1  --   --  0.6  --   --  0.7  --  ALBUMIN 2.6*  --   --  2.3*  --   --  2.3*  --   MG 1.6*  --   --   --   --   --  2.2  --   DDIMER  --   --   --   --  1.12*  --   --  1.48*  LATICACIDVEN  --  2.2* 3.2*  --   --  1.3  --   --   INR  --  1.2  --   --   --   --   --   --     ------------------------------------------------------------------------------------------------------------------ No results for input(s): "CHOL", "HDL", "LDLCALC", "TRIG", "CHOLHDL", "LDLDIRECT" in the last 72 hours.  Lab Results  Component Value Date   HGBA1C 6.6 (H) 01/30/2022    ------------------------------------------------------------------------------------------------------------------ No results for input(s): "TSH", "T4TOTAL", "T3FREE", "THYROIDAB" in the last 72 hours.  Invalid input(s): "FREET3"  Cardiac Enzymes No results for input(s): "CKMB", "TROPONINI", "MYOGLOBIN" in the last 168 hours.  Invalid input(s): "CK" ------------------------------------------------------------------------------------------------------------------    Component Value Date/Time   BNP 40.0 08/09/2018 2057    CBG: Recent Labs  Lab 04/13/22 0026 04/13/22 0804  GLUCAP 139* 162*    Recent Results (from the past 240 hour(s))  Culture, blood (Routine x 2)     Status: None (Preliminary result)   Collection Time: 04/12/22  2:49 PM   Specimen: Left Antecubital; Blood  Result Value Ref Range Status   Specimen Description LEFT ANTECUBITAL  Final   Special Requests   Final    BOTTLES DRAWN AEROBIC AND ANAEROBIC Blood Culture adequate volume   Culture   Final    NO GROWTH < 24 HOURS Performed at [facility], [address], [place], [place] [address]    Report Status PENDING  Incomplete  Culture, blood (Routine x 2)     Status: None (Preliminary result)   Collection Time: 04/12/22  2:49 PM   Specimen: Right Antecubital; Blood  Result Value Ref Range Status   Specimen Description RIGHT ANTECUBITAL  Final   Special Requests   Final    BOTTLES DRAWN AEROBIC AND ANAEROBIC Blood Culture results may not be optimal due to an excessive volume of blood received in culture bottles   Culture   Final    NO GROWTH < 24 HOURS Performed at [facility], [address], [place], [place] [address]    Report Status PENDING  Incomplete  Resp Panel by RT-PCR (Flu A&B, Covid) Anterior Nasal Swab     Status: Abnormal   Collection Time: 04/12/22  4:29 PM   Specimen: Anterior Nasal Swab  Result Value Ref Range Status   SARS Coronavirus 2 by RT PCR POSITIVE (A) NEGATIVE Final     Comment: (NOTE) SARS-CoV-2 target nucleic acids are DETECTED.  The SARS-CoV-2 RNA is generally detectable in upper respiratory specimens during the acute phase of infection. Positive results are indicative of the presence of the identified virus, but do not rule out bacterial infection or co-infection with other pathogens not detected by the test. Clinical correlation with patient history and other diagnostic information is necessary to determine patient infection status. The expected result is Negative.  Fact Sheet for Patients: EntrepreneurPulse.com.au  Fact Sheet for Healthcare Providers: IncredibleEmployment.be  This test is not yet approved or cleared by the [place] FDA and  has been authorized for detection and/or diagnosis of SARS-CoV-2 by FDA under an Emergency Use Authorization (EUA).  This EUA will remain in effect (  meaning this test can be used) for the duration of  the COVID-19 declaration under Section 564(b)(1) of the A ct, 21 U.S.C. section 360bbb-3(b)(1), unless the authorization is terminated or revoked sooner.     Influenza A by PCR NEGATIVE NEGATIVE Final   Influenza B by PCR NEGATIVE NEGATIVE Final    Comment: (NOTE) The Xpert Xpress SARS-CoV-2/FLU/RSV plus assay is intended as an aid in the diagnosis of influenza from Nasopharyngeal swab specimens and should not be used as a sole basis for treatment. Nasal washings and aspirates are unacceptable for Xpert Xpress SARS-CoV-2/FLU/RSV testing.  Fact Sheet for Patients: EntrepreneurPulse.com.au  Fact Sheet for Healthcare Providers: IncredibleEmployment.be  This test is not yet approved or cleared by the [place] FDA and has been authorized for detection and/or diagnosis of SARS-CoV-2 by FDA under an Emergency Use Authorization (EUA). This EUA will remain in effect (meaning this test can be used) for the duration of  the COVID-19 declaration under Section 564(b)(1) of the Act, 21 U.S.C. section 360bbb-3(b)(1), unless the authorization is terminated or revoked.  Performed at [facility], [address] [place], [place] [address]      Radiology Studies: CT Angio Chest PE W and/or Wo Contrast  Result Date: 04/12/2022 CLINICAL DATA:  Shortness of breath and wheezing. EXAM: CT ANGIOGRAPHY CHEST WITH CONTRAST TECHNIQUE: Multidetector CT imaging of the chest was performed using the standard protocol during bolus administration of intravenous contrast. Multiplanar CT image reconstructions and MIPs were obtained to evaluate the vascular anatomy. RADIATION DOSE REDUCTION: This exam was performed according to the departmental dose-optimization program which includes automated exposure control, adjustment of the mA and/or kV according to patient size and/or use of iterative reconstruction technique. CONTRAST:  66m OMNIPAQUE IOHEXOL 350 MG/ML SOLN COMPARISON:  March 29, 2022 FINDINGS: Cardiovascular: A right-sided venous Port-A-Cath is in place. There is mild calcification of the descending thoracic aorta, without evidence of aortic aneurysm. The subsegmental pulmonary arteries are limited in evaluation secondary to suboptimal opacification with intravenous contrast. No evidence of pulmonary embolism. Normal heart size with marked severity coronary artery calcification. No pericardial effusion. Mediastinum/Nodes: No enlarged mediastinal, hilar, or axillary lymph nodes. Thyroid gland, trachea, and esophagus demonstrate no significant findings. Lungs/Pleura: Mild lingular and mild anteromedial right upper lobe linear atelectasis is seen. There is no evidence of acute infiltrate, pleural effusion or pneumothorax. Upper Abdomen: There is diffuse fatty infiltration of the liver parenchyma. Musculoskeletal: A chronic compression fracture deformity is seen at the level of T6 vertebral body. Sclerotic changes are also seen within  this region. Small lytic lesions are also seen at the level of T6 and T7. Review of the MIP images confirms the above findings. IMPRESSION: 1. No evidence of pulmonary embolism. 2. Mild lingular and mild anteromedial right upper lobe linear atelectasis. 3. Chronic compression fracture deformity at the level of T6 vertebral body. 4. Small lytic lesions at the level of T6 and T7. 5. Hepatic steatosis. 6. Aortic atherosclerosis. Aortic Atherosclerosis (ICD10-I70.0). Electronically Signed   By: TVirgina NorfolkM.D.   On: 04/12/2022 19:59   DG Chest Port 1 View  Result Date: 04/12/2022 CLINICAL DATA:  Sepsis, short of breath, wheezing, anemia, multiple myeloma EXAM: PORTABLE CHEST 1 VIEW COMPARISON:  02/19/2022 FINDINGS: Single frontal view of the chest demonstrates stable right chest wall port. The cardiac silhouette is unremarkable. No airspace disease, effusion, or pneumothorax. No acute bony abnormality. IMPRESSION: 1. No acute intrathoracic process. Electronically Signed   By: MRanda NgoM.D.   On:  04/12/2022 15:26     Marzetta Board, MD, PhD [facility]  Between 7 am - 7 pm I am available, please contact me via Amion (for emergencies) or Securechat (non urgent messages)  Between 7 pm - 7 am I am not available, please contact night coverage MD/APP via Amion

## 2022-04-13 NOTE — Progress Notes (Signed)
   04/13/22 1800  Vitals  Temp 98 F (36.7 C)  Temp Source Oral  BP 124/64  Pulse Rate 82  Resp 20  MEWS COLOR  MEWS Score Color Green  Oxygen Therapy  SpO2 97 %  O2 Device Room Air  MEWS Score  MEWS Temp 0  MEWS Systolic 0  MEWS Pulse 0  MEWS RR 0  MEWS LOC 0  MEWS Score 0

## 2022-04-14 DIAGNOSIS — D649 Anemia, unspecified: Secondary | ICD-10-CM | POA: Diagnosis not present

## 2022-04-14 LAB — COMPREHENSIVE METABOLIC PANEL
ALT: 14 U/L (ref 0–44)
AST: 16 U/L (ref 15–41)
Albumin: 2.3 g/dL — ABNORMAL LOW (ref 3.5–5.0)
Alkaline Phosphatase: 149 U/L — ABNORMAL HIGH (ref 38–126)
Anion gap: 9 (ref 5–15)
BUN: 21 mg/dL — ABNORMAL HIGH (ref 6–20)
CO2: 21 mmol/L — ABNORMAL LOW (ref 22–32)
Calcium: 8.4 mg/dL — ABNORMAL LOW (ref 8.9–10.3)
Chloride: 108 mmol/L (ref 98–111)
Creatinine, Ser: 0.77 mg/dL (ref 0.44–1.00)
GFR, Estimated: >60 mL/min (ref >60)
Glucose, Bld: 174 mg/dL — ABNORMAL HIGH (ref 70–99)
Potassium: 4.7 mmol/L (ref 3.5–5.1)
Sodium: 138 mmol/L (ref 135–145)
Total Bilirubin: 0.9 mg/dL (ref 0.3–1.2)
Total Protein: 5.7 g/dL — ABNORMAL LOW (ref 6.5–8.1)

## 2022-04-14 LAB — CBC WITH DIFFERENTIAL/PLATELET
Abs Immature Granulocytes: 0.14 10*3/uL — ABNORMAL HIGH (ref 0.00–0.07)
Basophils Absolute: 0 10*3/uL (ref 0.0–0.1)
Basophils Relative: 0 %
Eosinophils Absolute: 0 10*3/uL (ref 0.0–0.5)
Eosinophils Relative: 0 %
HCT: 21.1 % — ABNORMAL LOW (ref 36.0–46.0)
Hemoglobin: 6.6 g/dL — CL (ref 12.0–15.0)
Immature Granulocytes: 4 %
Lymphocytes Relative: 16 %
Lymphs Abs: 0.6 10*3/uL — ABNORMAL LOW (ref 0.7–4.0)
MCH: 28.9 pg (ref 26.0–34.0)
MCHC: 31.3 g/dL (ref 30.0–36.0)
MCV: 92.5 fL (ref 80.0–100.0)
Monocytes Absolute: 0.3 10*3/uL (ref 0.1–1.0)
Monocytes Relative: 9 %
Neutro Abs: 2.6 10*3/uL (ref 1.7–7.7)
Neutrophils Relative %: 71 %
Platelets: 71 10*3/uL — ABNORMAL LOW (ref 150–400)
RBC: 2.28 MIL/uL — ABNORMAL LOW (ref 3.87–5.11)
RDW: 19.7 % — ABNORMAL HIGH (ref 11.5–15.5)
WBC: 3.7 10*3/uL — ABNORMAL LOW (ref 4.0–10.5)
nRBC: 1.3 % — ABNORMAL HIGH (ref 0.0–0.2)

## 2022-04-14 LAB — GLUCOSE, CAPILLARY
Glucose-Capillary: 187 mg/dL — ABNORMAL HIGH (ref 70–99)
Glucose-Capillary: 250 mg/dL — ABNORMAL HIGH (ref 70–99)
Glucose-Capillary: 274 mg/dL — ABNORMAL HIGH (ref 70–99)
Glucose-Capillary: 211 mg/dL — ABNORMAL HIGH (ref 70–99)

## 2022-04-14 LAB — MAGNESIUM: Magnesium: 2.2 mg/dL (ref 1.7–2.4)

## 2022-04-14 LAB — C-REACTIVE PROTEIN: CRP: 28.4 mg/dL — ABNORMAL HIGH (ref <1.0)

## 2022-04-14 LAB — D-DIMER, QUANTITATIVE: D-Dimer, Quant: 1.12 ug/mL-FEU — ABNORMAL HIGH (ref 0.00–0.50)

## 2022-04-14 LAB — PHOSPHORUS: Phosphorus: 2.2 mg/dL — ABNORMAL LOW (ref 2.5–4.6)

## 2022-04-14 LAB — PREPARE RBC (CROSSMATCH)

## 2022-04-14 LAB — FERRITIN: Ferritin: 1332 ng/mL — ABNORMAL HIGH (ref 11–307)

## 2022-04-14 MED ORDER — SODIUM CHLORIDE 0.9% IV SOLUTION: Freq: Once | INTRAVENOUS | Status: AC

## 2022-04-14 NOTE — Progress Notes (Signed)
Date and time results received: 04/14/22 0745 (use smartphrase ".now" to insert current time)  Test: HGB 6.6 Critical Value: HGB 6.6  Name of Provider Notified: Dr Joesph Fillers   Orders Received? Or Actions Taken?: Orders Received - See Orders for details Blood transfusion will be delayed due to blood products coming from another facility.

## 2022-04-14 NOTE — Progress Notes (Signed)
PROGRESS NOTE  Joanna Reid FHL:[id number] DOB: Nov 11, 1965 DOA: 04/12/2022 PCP: Lindell Spar, MD   LOS: 1 day   Brief Narrative / Interim history: 56 year old female with multiple myeloma, obesity, gout, prior CVA, overactive bladder, DM, who comes to the hospital with shortness of breath, wheezing as well as low hemoglobin.  She has been feeling fatigue and had difficulties breathing progressive over the last week.  She reports generalized weakness as well as a cough.  She is on chemotherapy for multiple myeloma.  Apparently she recovered from a bout of COVID infection in August 8, it was mild did not require hospitalization.  She was found to be febrile during this hospital admission, with a Tmax of 100.3, CT angio showed no evidence of PE but did show some lingular and anteromedial right upper lobe linear atelectasis.    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   -  Active problems Multiple myeloma ([place]) -Follows with Dr.  Delton Coombes.  Status post stem cell transplant 2019. Currently on immunotherapy with daratumumab and carfilzomib. -Pancytopenia persist--please see above -No bleeding concerns   COVID-19 virus infection -COVID-19 infection initially diagnosed 02/19/2022 -Remains symptomatic and continues to test positive most likely due to immunocompromise state -She would like to complete Paxlovid -Continue steroids due to wheezing and respiratory symptoms -She has been weaned off oxygen  Bilateral leg pain -somewhat chronic, mainly in the knees. 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  Patient with mild speech and cognitive concerns, not new - continue aspirin   Type 2 diabetes mellitus with other specified complication ([place]) -Controlled.  A1c 6.6.  Hold home medications,  Use Novolog/Humalog Sliding scale insulin with Accu-Cheks/Fingersticks as ordered   Essential hypertension, benign -Stable.  Scheduled Meds:  sodium chloride   Intravenous Once   acyclovir  400 mg Oral BID   albuterol  2 puff Inhalation Q6H   allopurinol  300 mg Oral Daily   amLODipine  2.5 mg Oral Daily   aspirin EC  81 mg Oral Daily   carvedilol  25 mg Oral BID   Chlorhexidine Gluconate Cloth  6 each Topical Daily   escitalopram  10 mg Oral Daily   insulin aspart  0-15 Units Subcutaneous TID WC   insulin aspart  0-5 Units Subcutaneous QHS   methylPREDNISolone (SOLU-MEDROL) injection  40 mg Intravenous Q12H   nirmatrelvir/ritonavir EUA  3 tablet Oral BID   pantoprazole  40 mg Oral Daily   pregabalin  200 mg Oral BID   Continuous  Infusions:   PRN Meds:.mouth rinse, oxyCODONE, polyethylene glycol, promethazine  Current Outpatient Medications  Medication Instructions   acyclovir (ZOVIRAX) 400 MG tablet TAKE (1) TABLET BY MOUTH TWICE DAILY.   albuterol (VENTOLIN HFA) 108 (90 Base) MCG/ACT inhaler 2 puffs, Inhalation, Every 4 hours  PRN   allopurinol (ZYLOPRIM) 300 MG tablet TAKE (1) TABLET BY MOUTH ONCE DAILY.   amLODipine (NORVASC) 2.5 mg, Oral, Daily   aspirin EC 81 mg, Oral, Daily   BD PEN NEEDLE NANO U/F 32G X 4 MM MISC Subcutaneous   bumetanide (BUMEX) 0.5 mg, Oral, Daily   carvedilol (COREG) 25 mg, Oral, 2 times daily   cyclobenzaprine (FLEXERIL) 5 mg, Oral, 3 times daily PRN, Do not drink alcohol or drive while taking this medication.  May cause drowsiness.   DARZALEX FASPRO 1800-30000 MG-UT/15ML SOLN Inject 15 ml over 3-5 minutes subcutaneously once every 2 weeks.   diclofenac Sodium (VOLTAREN) 2 g, Topical, 4 times daily   docusate sodium (COLACE) 100 mg, Oral, Daily   Dulaglutide 1.5 mg, Subcutaneous, Every 7 days   escitalopram (LEXAPRO) 10 MG tablet TAKE (1) TABLET BY MOUTH ONCE DAILY. MAY START WITH 1/2 TABLET FOR 7 DAYS.   fluticasone (FLONASE) 50 MCG/ACT nasal spray INSTILL 2 SPRAYS INTO BOTH NOSTRILS DAILY   Insulin Lispro w/ Trans Port 20 Units, Subcutaneous, See admin instructions, Inject before meals per sliding scale Max TDD 20 units   KYPROLIS 30 MG SOLR Intravenous   Lactulose 20 g, Oral, Daily   lidocaine-prilocaine (EMLA) cream 1 Application, Topical, As needed   LORazepam (ATIVAN) 0.5 mg, Oral, Every 8 hours   losartan (COZAAR) 100 mg, Oral, Daily   magnesium oxide (MAG-OX) 400 mg, Oral, 3 times daily   metFORMIN (GLUCOPHAGE) 500 mg, Oral, 2 times daily   nitrofurantoin, macrocrystal-monohydrate, (MACROBID) 100 MG capsule TAKE (1) CAPSULE BY MOUTH AT BEDTIME.   NOVOLOG FLEXPEN 100 UNIT/ML FlexPen Inject into the skin.   oxyCODONE-acetaminophen (PERCOCET/ROXICET) 5-325 MG tablet 1 tablet, Oral, 2 times daily PRN   oxyCODONE-acetaminophen (PERCOCET/ROXICET) 5-325 MG tablet One tablet every four hours as need for pain,  5 day limit.   pantoprazole (PROTONIX) 40 MG tablet TAKE (1) TABLET BY MOUTH ONCE DAILY.   pregabalin (LYRICA) 200 MG capsule TAKE (1) CAPSULE BY MOUTH TWICE DAILY.   Propylene  Glycol (SYSTANE BALANCE) 0.6 % SOLN 1 drop, Ophthalmic, Daily PRN   rosuvastatin (CRESTOR) 5 mg, Oral, Daily   scopolamine (TRANSDERM-SCOP) 1.5 mg, Transdermal, every 72 hours   valACYclovir (VALTREX) 1000 MG tablet TAKE 2 TABLETS BY MOUTH NOW; THEN 2 12 HOURS LATER.    Diet Orders (From admission, onward)     Start     Ordered   04/12/22 2349  Diet heart healthy/carb modified Room service appropriate? Yes; Fluid consistency: Thin  Diet effective now       Question Answer Comment  Diet-HS Snack? Nothing   Room service appropriate? Yes   Fluid consistency: Thin      04/12/22 2348            DVT prophylaxis: SCDs Start: 04/12/22 2349   Lab Results  Component Value Date   PLT 71 (L) 04/14/2022      Code Status: Full Code  Family Communication: Significant other at bedside  Status is: Inpatient  The patient will require care spanning > 2 midnights and should be moved to inpatient because: Remains symptomatic, low-grade temp, has significant anemia waiting for transfusion  Level of care: Telemetry  Consultants:  None  Objective: Vitals:   04/14/22 1400 04/14/22 1552 04/14/22 1629 04/14/22 1657  BP: 120/73  138/87 123/72  Pulse: 73  85 83  Resp: 18  20 19   Temp: 97.9 F (36.6 C)  97.6 F (36.4 C) (!) 97.5 F (36.4 C)  TempSrc: Oral  Oral Oral  SpO2: 97% 95% 99% 97%  Weight:      Height:        Intake/Output Summary (Last 24 hours) at 04/14/2022 1721 Last data filed at 04/14/2022 0645 Gross per 24 hour  Intake 802 ml  Output --  Net 802 ml   Wt Readings from Last 3 Encounters:  04/13/22 116 kg  04/12/22 113.5 kg  04/05/22 114.4 kg   Physical Exam  Gen:- Awake Alert, in no acute distress  HEENT:- [place].AT, No sclera icterus Neck-Supple Neck,No JVD,.  Lungs-mostly clear, fair air movement bilaterally  CV- S1, S2 normal, RRR, right chest Port-A-Cath site clean dry intact Abd-  +ve B.Sounds, Abd Soft, No tenderness, increased truncal  adiposity Extremity/Skin:- No  edema,   good pedal pulses  Psych-affect is appropriate, oriented x3 Neuro-generalized weakness, no new focal deficits, no tremors  Data Reviewed: I have independently reviewed following labs and imaging studies   CBC Recent Labs  Lab 04/12/22 1158 04/12/22 2011 04/13/22 0301 04/14/22 0609  WBC 6.5 4.8 5.0 3.7*  HGB 7.4* 6.1* 5.6* 6.6*  HCT 23.9* 19.7* 18.7* 21.1*  PLT 71* 55* 58* 71*  MCV 93.4 93.4 95.4 92.5  MCH 28.9 28.9 28.6 28.9  MCHC 31.0 31.0 29.9* 31.3  RDW 19.9* 20.1* 20.0* 19.7*  LYMPHSABS 0.9 0.8 0.9 0.6*  MONOABS 1.3* 1.0 1.1* 0.3  EOSABS 0.0 0.0 0.0 0.0  BASOSABS 0.0 0.0 0.0 0.0    Recent Labs  Lab 04/12/22 1158 04/12/22 1449 04/12/22 1654 04/12/22 2011 04/12/22 2225 04/13/22 0011 04/13/22 0301 04/13/22 0815 04/14/22 0609  NA 138  --   --  135  --   --  138  --  138  K 4.6  --   --  4.4  --   --  4.3  --  4.7  CL 107  --   --  108  --   --  108  --  108  CO2 21*  --   --  22  --   --  21*  --  21*  GLUCOSE 213*  --   --  128*  --   --  156*  --  174*  BUN 23*  --   --  22*  --   --  20  --  21*  CREATININE 0.99  --   --  0.87  --   --  0.76  --  0.77  CALCIUM 8.2*  --   --  7.6*  --   --  7.9*  --  8.4*  AST 35  --   --  26  --   --  22  --  16  ALT 16  --   --  13  --   --  13  --  14  ALKPHOS 171*  --   --  140*  --   --  153*  --  149*  BILITOT 1.1  --   --  0.6  --   --  0.7  --  0.9  ALBUMIN 2.6*  --   --  2.3*  --   --  2.3*  --  2.3*  MG 1.6*  --   --   --   --   --  2.2  --  2.2  CRP  --   --   --   --  23.5*  --  25.7*  --  28.4*  DDIMER  --   --   --   --  1.12*  --   --  1.48* 1.12*  LATICACIDVEN  --  2.2* 3.2*  --   --  1.3  --   --   --   INR  --  1.2  --   --   --   --   --   --   --    Lab Results  Component Value Date   HGBA1C 6.6 (H) 01/30/2022   ------------------------------------------------------------------------------------------------------------------    Component Value Date/Time    BNP 40.0 08/09/2018 2057    CBG: Recent Labs  Lab 04/13/22 1625 04/13/22 2005 04/14/22 0728 04/14/22 1142 04/14/22 1600  GLUCAP 214* 296* 187* 250* 274*    Recent Results (from the past 240 hour(s))  Culture, blood (Routine x 2)     Status: None (Preliminary result)   Collection Time: 04/12/22  2:49 PM   Specimen: Left Antecubital; Blood  Result Value Ref Range Status   Specimen Description LEFT ANTECUBITAL  Final   Special Requests   Final    BOTTLES DRAWN AEROBIC AND ANAEROBIC Blood Culture adequate volume   Culture   Final    NO GROWTH 2 DAYS Performed at [facility], [address] [place], [place] [address]    Report Status PENDING  Incomplete  Culture, blood (Routine x 2)     Status: None (Preliminary result)   Collection Time: 04/12/22  2:49 PM   Specimen: Right Antecubital; Blood  Result Value Ref Range Status   Specimen Description RIGHT ANTECUBITAL  Final   Special Requests   Final    BOTTLES DRAWN AEROBIC AND ANAEROBIC Blood Culture results may not be optimal due to an excessive volume of blood received in culture bottles   Culture   Final    NO GROWTH 2 DAYS Performed at [facility], [address], [place],  [address]    Report Status PENDING  Incomplete  Resp Panel by RT-PCR (Flu A&B, Covid) Anterior Nasal Swab     Status: Abnormal   Collection Time: 04/12/22  4:29 PM   Specimen: Anterior Nasal Swab  Result Value Ref Range Status   SARS Coronavirus 2 by RT PCR POSITIVE (A) NEGATIVE Final    Comment: (NOTE) SARS-CoV-2 target nucleic acids are DETECTED.  The SARS-CoV-2 RNA is generally detectable in upper respiratory specimens during the acute phase of infection. Positive results are indicative of the presence of the identified virus, but do not rule out bacterial infection or co-infection with other pathogens not detected by the test. Clinical correlation with patient history and other diagnostic information is necessary to determine  patient infection status. The expected result is Negative.  Fact Sheet for Patients: EntrepreneurPulse.com.au  Fact Sheet for Healthcare Providers: IncredibleEmployment.be  This test is not yet approved or cleared by the [place] FDA and  has been authorized for detection and/or diagnosis of SARS-CoV-2 by FDA under an Emergency Use Authorization (EUA).  This EUA will remain in effect (meaning this test can be used) for the duration of  the COVID-19 declaration under Section 564(b)(1) of the A ct, 21 U.S.C. section 360bbb-3(b)(1), unless the authorization is terminated or revoked sooner.     Influenza A by PCR NEGATIVE NEGATIVE Final   Influenza B by PCR NEGATIVE NEGATIVE Final  Comment: (NOTE) The Xpert Xpress SARS-CoV-2/FLU/RSV plus assay is intended as an aid in the diagnosis of influenza from Nasopharyngeal swab specimens and should not be used as a sole basis for treatment. Nasal washings and aspirates are unacceptable for Xpert Xpress SARS-CoV-2/FLU/RSV testing.  Fact Sheet for Patients: EntrepreneurPulse.com.au  Fact Sheet for Healthcare Providers: IncredibleEmployment.be  This test is not yet approved or cleared by the [place] FDA and has been authorized for detection and/or diagnosis of SARS-CoV-2 by FDA under an Emergency Use Authorization (EUA). This EUA will remain in effect (meaning this test can be used) for the duration of the COVID-19 declaration under Section 564(b)(1) of the Act, 21 U.S.C. section 360bbb-3(b)(1), unless the authorization is terminated or revoked.  Performed at [facility], [address], [place], [place] [address]   MRSA Next Gen by PCR, Nasal     Status: None   Collection Time: 04/13/22 10:09 AM   Specimen: Nasal Mucosa; Nasal Swab  Result Value Ref Range Status   MRSA by PCR Next Gen NOT DETECTED NOT DETECTED Final    Comment: (NOTE) The GeneXpert  MRSA Assay (FDA approved for NASAL specimens only), is one component of a comprehensive MRSA colonization surveillance program. It is not intended to diagnose MRSA infection nor to guide or monitor treatment for MRSA infections. Test performance is not FDA approved in patients less than 51 years old. Performed at [facility], [address], [place], [place] [address]      Radiology Studies: No results found.  Roxan Hockey, MD  [facility]  Between 7 am - 7 pm I am available, please contact me via [place] (for emergencies) or Securechat (non urgent messages)  Between 7 pm - 7 am I am not available, please contact night coverage MD/APP via Amion

## 2022-04-15 DIAGNOSIS — D649 Anemia, unspecified: Secondary | ICD-10-CM | POA: Diagnosis not present

## 2022-04-15 LAB — FERRITIN: Ferritin: 1197 ng/mL — ABNORMAL HIGH (ref 11–307)

## 2022-04-15 LAB — COMPREHENSIVE METABOLIC PANEL
ALT: 14 U/L (ref 0–44)
AST: 14 U/L — ABNORMAL LOW (ref 15–41)
Albumin: 2.5 g/dL — ABNORMAL LOW (ref 3.5–5.0)
Alkaline Phosphatase: 131 U/L — ABNORMAL HIGH (ref 38–126)
Anion gap: 8 (ref 5–15)
BUN: 24 mg/dL — ABNORMAL HIGH (ref 6–20)
CO2: 20 mmol/L — ABNORMAL LOW (ref 22–32)
Calcium: 8.8 mg/dL — ABNORMAL LOW (ref 8.9–10.3)
Chloride: 110 mmol/L (ref 98–111)
Creatinine, Ser: 0.81 mg/dL (ref 0.44–1.00)
GFR, Estimated: >60 mL/min (ref >60)
Glucose, Bld: 179 mg/dL — ABNORMAL HIGH (ref 70–99)
Potassium: 4.2 mmol/L (ref 3.5–5.1)
Sodium: 138 mmol/L (ref 135–145)
Total Bilirubin: 0.7 mg/dL (ref 0.3–1.2)
Total Protein: 5.7 g/dL — ABNORMAL LOW (ref 6.5–8.1)

## 2022-04-15 LAB — CBC WITH DIFFERENTIAL/PLATELET
Abs Immature Granulocytes: 0.37 10*3/uL — ABNORMAL HIGH (ref 0.00–0.07)
Basophils Absolute: 0 10*3/uL (ref 0.0–0.1)
Basophils Relative: 0 %
Eosinophils Absolute: 0 10*3/uL (ref 0.0–0.5)
Eosinophils Relative: 0 %
HCT: 23.8 % — ABNORMAL LOW (ref 36.0–46.0)
Hemoglobin: 7.7 g/dL — ABNORMAL LOW (ref 12.0–15.0)
Immature Granulocytes: 4 %
Lymphocytes Relative: 8 %
Lymphs Abs: 0.7 10*3/uL (ref 0.7–4.0)
MCH: 29.5 pg (ref 26.0–34.0)
MCHC: 32.4 g/dL (ref 30.0–36.0)
MCV: 91.2 fL (ref 80.0–100.0)
Monocytes Absolute: 0.5 10*3/uL (ref 0.1–1.0)
Monocytes Relative: 5 %
Neutro Abs: 6.9 10*3/uL (ref 1.7–7.7)
Neutrophils Relative %: 83 %
Platelets: 86 10*3/uL — ABNORMAL LOW (ref 150–400)
RBC: 2.61 MIL/uL — ABNORMAL LOW (ref 3.87–5.11)
RDW: 19.2 % — ABNORMAL HIGH (ref 11.5–15.5)
WBC: 8.4 10*3/uL (ref 4.0–10.5)
nRBC: 1.2 % — ABNORMAL HIGH (ref 0.0–0.2)

## 2022-04-15 LAB — GLUCOSE, CAPILLARY
Glucose-Capillary: 285 mg/dL — ABNORMAL HIGH (ref 70–99)
Glucose-Capillary: 211 mg/dL — ABNORMAL HIGH (ref 70–99)
Glucose-Capillary: 249 mg/dL — ABNORMAL HIGH (ref 70–99)
Glucose-Capillary: 382 mg/dL — ABNORMAL HIGH (ref 70–99)

## 2022-04-15 LAB — D-DIMER, QUANTITATIVE: D-Dimer, Quant: 2.49 ug/mL-FEU — ABNORMAL HIGH (ref 0.00–0.50)

## 2022-04-15 LAB — MAGNESIUM: Magnesium: 2.1 mg/dL (ref 1.7–2.4)

## 2022-04-15 LAB — PHOSPHORUS: Phosphorus: 2.9 mg/dL (ref 2.5–4.6)

## 2022-04-15 LAB — C-REACTIVE PROTEIN: CRP: 12.1 mg/dL — ABNORMAL HIGH (ref <1.0)

## 2022-04-15 MED ORDER — ALBUTEROL SULFATE HFA 108 (90 BASE) MCG/ACT IN AERS
2.0000 | INHALATION_SPRAY | Freq: Three times a day (TID) | RESPIRATORY_TRACT | Status: DC
  Administered 2022-04-15 – 2022-04-16 (×4): 2 via RESPIRATORY_TRACT

## 2022-04-15 NOTE — Progress Notes (Addendum)
PROGRESS NOTE  Joanna Reid EHU:[id number] DOB: [birth year] DOA: 04/12/2022 PCP: Lindell Spar, MD   LOS: 2 days   Brief Narrative / Interim history: 56 year old female with multiple myeloma, obesity, gout, prior CVA, overactive bladder, DM, who comes to the hospital with shortness of breath, wheezing as well as low hemoglobin.  She has been feeling fatigue and had difficulties breathing progressive over the last week.  She reports generalized weakness as well as a cough.  She is on chemotherapy for multiple myeloma.  Apparently she recovered from a bout of COVID infection in August 8, it was mild did not require hospitalization.  She was found to be febrile during this hospital admission, with a Tmax of 100.3, CT angio showed no evidence of PE but did show some lingular and anteromedial right upper lobe linear atelectasis.    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   -Hgb currently 7.7 after transfusion of 2 units of PRBC this admission  Active problems Multiple myeloma ([place]) -Follows with Dr. Delton Coombes.  Status post stem cell transplant 2019.  Currently on immunotherapy with daratumumab and carfilzomib. -Pancytopenia persist--please see above---however Hgb and platelets improving -No bleeding concerns   COVID-19 virus infection -COVID-19 infection initially diagnosed 02/19/2022 -Remains symptomatic and continues to test positive most likely due to immunocompromise state -She would like to complete Paxlovid -Continue steroids due to wheezing and respiratory symptoms -She has been weaned off oxygen -CRP and ferritin trending down COVID-19 Labs  Recent Labs    04/12/22 2225 04/13/22 0301 04/13/22 0815 04/14/22 0609 04/15/22 0605  DDIMER 1.12*  --  1.48* 1.12* 2.49*  FERRITIN >1,500* 1,379*  --  1,332* 1,197*  LDH 556*  --   --   --   --   CRP 23.5* 25.7*  --  28.4* 12.1*   Bilateral leg pain -somewhat chronic, mainly in the knees. 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  Patient with mild residual speech and cognitive concerns, not new - continue aspirin   Type 2 diabetes mellitus with other specified complication ([place]) -Controlled.  A1c 6.6.  Hold home medications,  Use Novolog/Humalog Sliding scale insulin with Accu-Cheks/Fingersticks as ordered   Essential hypertension, benign -Stable.  Scheduled Meds:  sodium chloride   Intravenous Once   acyclovir  400 mg Oral BID   albuterol  2 puff Inhalation TID   allopurinol  300 mg Oral Daily   amLODipine  2.5 mg Oral Daily   aspirin EC  81 mg Oral Daily   carvedilol  25 mg Oral BID   Chlorhexidine Gluconate Cloth  6 each Topical Daily   escitalopram  10 mg Oral Daily   insulin aspart  0-15 Units Subcutaneous TID WC   insulin aspart  0-5 Units Subcutaneous QHS   methylPREDNISolone (SOLU-MEDROL) injection  40 mg Intravenous Q12H   nirmatrelvir/ritonavir EUA  3 tablet Oral BID   pantoprazole  40 mg Oral Daily   pregabalin  200 mg Oral BID   Continuous Infusions:   PRN  Meds:.mouth rinse, oxyCODONE, polyethylene glycol, promethazine  Current Outpatient Medications  Medication Instructions   acyclovir (ZOVIRAX) 400 MG tablet TAKE (1) TABLET BY MOUTH TWICE DAILY.   albuterol (VENTOLIN HFA) 108 (90 Base) MCG/ACT inhaler 2 puffs, Inhalation, Every 4 hours PRN   allopurinol (ZYLOPRIM) 300 MG tablet TAKE (1) TABLET BY MOUTH ONCE DAILY.   amLODipine (NORVASC) 2.5 mg, Oral, Daily   aspirin EC 81 mg, Oral, Daily   BD PEN NEEDLE NANO U/F 32G X 4 MM MISC Subcutaneous   bumetanide (BUMEX) 0.5 mg, Oral, Daily   carvedilol (COREG) 25 mg, Oral, 2 times daily   cyclobenzaprine (FLEXERIL) 5 mg, Oral, 3 times daily PRN, Do not drink alcohol or drive while taking this medication.  May cause drowsiness.   DARZALEX FASPRO 1800-30000 MG-UT/15ML SOLN Inject 15 ml over 3-5 minutes subcutaneously once every 2 weeks.   diclofenac Sodium (VOLTAREN) 2 g, Topical, 4 times daily   docusate sodium (COLACE) 100 mg, Oral, Daily   Dulaglutide 1.5 mg, Subcutaneous, Every 7 days   escitalopram (LEXAPRO) 10 MG tablet TAKE (1) TABLET BY MOUTH ONCE DAILY. MAY START WITH 1/2 TABLET FOR 7 DAYS.   fluticasone (FLONASE) 50 MCG/ACT nasal spray INSTILL 2 SPRAYS INTO BOTH NOSTRILS DAILY   Insulin Lispro w/ Trans Port 20 Units, Subcutaneous, See admin instructions, Inject before meals per sliding scale Max TDD 20 units   KYPROLIS 30 MG SOLR Intravenous   Lactulose 20 g, Oral, Daily   lidocaine-prilocaine (EMLA) cream 1 Application, Topical, As needed   LORazepam (ATIVAN) 0.5 mg, Oral, Every 8 hours   losartan (COZAAR) 100 mg, Oral, Daily   magnesium oxide (MAG-OX) 400 mg, Oral, 3 times daily   metFORMIN (GLUCOPHAGE) 500 mg, Oral, 2 times daily   nitrofurantoin, macrocrystal-monohydrate, (MACROBID) 100 MG capsule TAKE (1) CAPSULE BY MOUTH AT BEDTIME.   NOVOLOG FLEXPEN 100 UNIT/ML FlexPen Inject into the skin.   oxyCODONE-acetaminophen (PERCOCET/ROXICET) 5-325 MG tablet 1 tablet, Oral, 2 times daily  PRN   oxyCODONE-acetaminophen (PERCOCET/ROXICET) 5-325 MG tablet One tablet every four hours as need for pain,  5 day limit.   pantoprazole (PROTONIX) 40 MG tablet TAKE (1) TABLET BY MOUTH ONCE DAILY.   pregabalin (LYRICA) 200 MG capsule TAKE (1) CAPSULE BY MOUTH TWICE DAILY.   Propylene Glycol (SYSTANE BALANCE) 0.6 % SOLN 1 drop, Ophthalmic, Daily PRN   rosuvastatin (CRESTOR) 5 mg, Oral, Daily   scopolamine (TRANSDERM-SCOP) 1.5 mg, Transdermal, every 72 hours   valACYclovir (VALTREX) 1000 MG tablet TAKE 2 TABLETS BY MOUTH NOW; THEN 2 12 HOURS LATER.    Diet Orders (From admission, onward)     Start     Ordered   04/12/22 2349  Diet heart healthy/carb modified Room service appropriate? Yes; Fluid consistency: Thin  Diet effective now       Question Answer Comment  Diet-HS Snack? Nothing   Room service appropriate? Yes   Fluid consistency: Thin      04/12/22 2348            DVT prophylaxis: SCDs Start: 04/12/22 2349   Lab Results  Component Value Date  PLT 86 (L) 04/15/2022      Code Status: Full Code  Family Communication: Husband at bedside  Status is: Inpatient  The patient will require care spanning > 2 midnights and should be moved to inpatient because: Remains symptomatic, low-grade temp, has significant anemia waiting for transfusion  Level of care: Telemetry  Consultants:  None   Objective: Vitals:   04/15/22 0859 04/15/22 0907 04/15/22 1414 04/15/22 1446  BP:  (!) 150/90 126/76   Pulse:  76 64   Resp:  18 18   Temp:  97.9 F (36.6 C) 97.7 F (36.5 C)   TempSrc:   Oral   SpO2: 99% 99% 99% 97%  Weight:      Height:        Intake/Output Summary (Last 24 hours) at 04/15/2022 1822 Last data filed at 04/14/2022 2230 Gross per 24 hour  Intake 1011 ml  Output --  Net 1011 ml   Wt Readings from Last 3 Encounters:  04/13/22 116 kg  04/12/22 113.5 kg  04/05/22 114.4 kg   Physical Exam  Gen:- Awake Alert, in no acute distress  HEENT:- [place].AT, No  sclera icterus Neck-Supple Neck,No JVD,.  Lungs-mostly clear, fair air movement bilaterally  CV- S1, S2 normal, RRR, right chest Port-A-Cath site clean dry intact Abd-  +ve B.Sounds, Abd Soft, No tenderness, increased truncal adiposity Extremity/Skin:- No  edema,   good pedal pulses  Psych-affect is appropriate, oriented x3 Neuro-generalized weakness, no new focal deficits, no tremors  Data Reviewed: I have independently reviewed following labs and imaging studies   CBC Recent Labs  Lab 04/12/22 1158 04/12/22 2011 04/13/22 0301 04/14/22 0609 04/15/22 0605  WBC 6.5 4.8 5.0 3.7* 8.4  HGB 7.4* 6.1* 5.6* 6.6* 7.7*  HCT 23.9* 19.7* 18.7* 21.1* 23.8*  PLT 71* 55* 58* 71* 86*  MCV 93.4 93.4 95.4 92.5 91.2  MCH 28.9 28.9 28.6 28.9 29.5  MCHC 31.0 31.0 29.9* 31.3 32.4  RDW 19.9* 20.1* 20.0* 19.7* 19.2*  LYMPHSABS 0.9 0.8 0.9 0.6* 0.7  MONOABS 1.3* 1.0 1.1* 0.3 0.5  EOSABS 0.0 0.0 0.0 0.0 0.0  BASOSABS 0.0 0.0 0.0 0.0 0.0    Recent Labs  Lab 04/12/22 1158 04/12/22 1449 04/12/22 1654 04/12/22 2011 04/12/22 2225 04/13/22 0011 04/13/22 0301 04/13/22 0815 04/14/22 0609 04/15/22 0605  NA 138  --   --  135  --   --  138  --  138 138  K 4.6  --   --  4.4  --   --  4.3  --  4.7 4.2  CL 107  --   --  108  --   --  108  --  108 110  CO2 21*  --   --  22  --   --  21*  --  21* 20*  GLUCOSE 213*  --   --  128*  --   --  156*  --  174* 179*  BUN 23*  --   --  22*  --   --  20  --  21* 24*  CREATININE 0.99  --   --  0.87  --   --  0.76  --  0.77 0.81  CALCIUM 8.2*  --   --  7.6*  --   --  7.9*  --  8.4* 8.8*  AST 35  --   --  26  --   --  22  --  16 14*  ALT 16  --   --  13  --   --  13  --  14 14  ALKPHOS 171*  --   --  140*  --   --  153*  --  149* 131*  BILITOT 1.1  --   --  0.6  --   --  0.7  --  0.9 0.7  ALBUMIN 2.6*  --   --  2.3*  --   --  2.3*  --  2.3* 2.5*  MG 1.6*  --   --   --   --   --  2.2  --  2.2 2.1  CRP  --   --   --   --  23.5*  --  25.7*  --  28.4* 12.1*   DDIMER  --   --   --   --  1.12*  --   --  1.48* 1.12* 2.49*  LATICACIDVEN  --  2.2* 3.2*  --   --  1.3  --   --   --   --   INR  --  1.2  --   --   --   --   --   --   --   --    Lab Results  Component Value Date   HGBA1C 6.6 (H) 01/30/2022   ------------------------------------------------------------------------------------------------------------------    Component Value Date/Time   BNP 40.0 08/09/2018 2057    CBG: Recent Labs  Lab 04/14/22 1600 04/14/22 2119 04/15/22 0928 04/15/22 1159 04/15/22 1641  GLUCAP 274* 211* 285* 211* 249*    Recent Results (from the past 240 hour(s))  Culture, blood (Routine x 2)     Status: None (Preliminary result)   Collection Time: 04/12/22  2:49 PM   Specimen: Left Antecubital; Blood  Result Value Ref Range Status   Specimen Description LEFT ANTECUBITAL  Final   Special Requests   Final    BOTTLES DRAWN AEROBIC AND ANAEROBIC Blood Culture adequate volume   Culture   Final    NO GROWTH 3 DAYS Performed at [facility], [address], [place], [place] [address]    Report Status PENDING  Incomplete  Culture, blood (Routine x 2)     Status: None (Preliminary result)   Collection Time: 04/12/22  2:49 PM   Specimen: Right Antecubital; Blood  Result Value Ref Range Status   Specimen Description RIGHT ANTECUBITAL  Final   Special Requests   Final    BOTTLES DRAWN AEROBIC AND ANAEROBIC Blood Culture results may not be optimal due to an excessive volume of blood received in culture bottles   Culture   Final    NO GROWTH 3 DAYS Performed at [facility], [address], [place], [place] [address]    Report Status PENDING  Incomplete  Resp Panel by RT-PCR (Flu A&B, Covid) Anterior Nasal Swab     Status: Abnormal   Collection Time: 04/12/22  4:29 PM   Specimen: Anterior Nasal Swab  Result Value Ref Range Status   SARS Coronavirus 2 by RT PCR POSITIVE (A) NEGATIVE Final    Comment: (NOTE) SARS-CoV-2 target nucleic acids are  DETECTED.  The SARS-CoV-2 RNA is generally detectable in upper respiratory specimens during the acute phase of infection. Positive results are indicative of the presence of the identified virus, but do not rule out bacterial infection or co-infection with other pathogens not detected by the test. Clinical correlation with patient history and other diagnostic information is necessary to determine patient infection status. The expected result is Negative.  Fact Sheet for Patients: EntrepreneurPulse.com.au  Fact Sheet for Healthcare Providers: IncredibleEmployment.be  This test is not yet approved or cleared by the [place] FDA and  has been authorized for detection and/or diagnosis of SARS-CoV-2 by FDA under an Emergency Use Authorization (EUA).  This EUA will remain in effect (meaning this test can be used) for the duration of  the COVID-19 declaration under Section 564(b)(1) of the A ct, 21 U.S.C. section 360bbb-3(b)(1), unless the authorization is terminated or revoked sooner.     Influenza A by PCR NEGATIVE NEGATIVE Final   Influenza B by PCR NEGATIVE NEGATIVE Final    Comment: (NOTE) The Xpert Xpress SARS-CoV-2/FLU/RSV plus assay is intended as an aid in the diagnosis of influenza from Nasopharyngeal swab specimens and should not be used as a sole basis for treatment. Nasal washings and aspirates are unacceptable for Xpert Xpress SARS-CoV-2/FLU/RSV testing.  Fact Sheet for Patients: EntrepreneurPulse.com.au  Fact Sheet for Healthcare Providers: IncredibleEmployment.be  This test is not yet approved or cleared by the [place] FDA and has been authorized for detection and/or diagnosis of SARS-CoV-2 by FDA under an Emergency Use Authorization (EUA). This EUA will remain in effect (meaning this test can be used) for the duration of the COVID-19 declaration under Section 564(b)(1) of the Act, 21  U.S.C. section 360bbb-3(b)(1), unless the authorization is terminated or revoked.  Performed at [facility], [address] [place], [place] [address]   MRSA Next Gen by PCR, Nasal     Status: None   Collection Time: 04/13/22 10:09 AM   Specimen: Nasal Mucosa; Nasal Swab  Result Value Ref Range Status   MRSA by PCR Next Gen NOT DETECTED NOT DETECTED Final    Comment: (NOTE) The GeneXpert MRSA Assay (FDA approved for NASAL specimens only), is one component of a comprehensive MRSA colonization surveillance program. It is not intended to diagnose MRSA infection nor to guide or monitor treatment for MRSA infections. Test performance is not FDA approved in patients less than 67 years old. Performed at [facility], [address], [place], [place] [address]      Radiology Studies: No results found.  Roxan Hockey, MD  [facility]  Between 7 am - 7 pm I am available, please contact me via [place] (for emergencies) or Securechat (non urgent messages)  Between 7 pm - 7 am I am not available, please contact night coverage MD/APP via Amion

## 2022-04-16 DIAGNOSIS — D649 Anemia, unspecified: Secondary | ICD-10-CM | POA: Diagnosis not present

## 2022-04-16 LAB — TYPE AND SCREEN
ABO/RH(D): A POS
Antibody Screen: POSITIVE
Donor AG Type: NEGATIVE
Unit division: 0
Unit division: 0
Unit division: 0

## 2022-04-16 LAB — BPAM RBC
Blood Product Expiration Date: 202310142359
Blood Product Expiration Date: 202311032359
Blood Product Expiration Date: 202311062359
ISSUE DATE / TIME: 202310061514
ISSUE DATE / TIME: 202310071637
Unit Type and Rh: 6200
Unit Type and Rh: 6200
Unit Type and Rh: 6200

## 2022-04-16 LAB — COMPREHENSIVE METABOLIC PANEL
ALT: 22 U/L (ref 0–44)
AST: 18 U/L (ref 15–41)
Albumin: 2.5 g/dL — ABNORMAL LOW (ref 3.5–5.0)
Alkaline Phosphatase: 112 U/L (ref 38–126)
Anion gap: 8 (ref 5–15)
BUN: 30 mg/dL — ABNORMAL HIGH (ref 6–20)
CO2: 20 mmol/L — ABNORMAL LOW (ref 22–32)
Calcium: 8.8 mg/dL — ABNORMAL LOW (ref 8.9–10.3)
Chloride: 111 mmol/L (ref 98–111)
Creatinine, Ser: 0.98 mg/dL (ref 0.44–1.00)
GFR, Estimated: >60 mL/min (ref >60)
Glucose, Bld: 188 mg/dL — ABNORMAL HIGH (ref 70–99)
Potassium: 4.4 mmol/L (ref 3.5–5.1)
Sodium: 139 mmol/L (ref 135–145)
Total Bilirubin: 0.5 mg/dL (ref 0.3–1.2)
Total Protein: 5.4 g/dL — ABNORMAL LOW (ref 6.5–8.1)

## 2022-04-16 LAB — GLUCOSE, CAPILLARY
Glucose-Capillary: 163 mg/dL — ABNORMAL HIGH (ref 70–99)
Glucose-Capillary: 248 mg/dL — ABNORMAL HIGH (ref 70–99)
Glucose-Capillary: 190 mg/dL — ABNORMAL HIGH (ref 70–99)

## 2022-04-16 LAB — CBC WITH DIFFERENTIAL/PLATELET
Abs Immature Granulocytes: 0 10*3/uL (ref 0.00–0.07)
Band Neutrophils: 2 %
Basophils Absolute: 0 10*3/uL (ref 0.0–0.1)
Basophils Relative: 0 %
Eosinophils Absolute: 0 10*3/uL (ref 0.0–0.5)
Eosinophils Relative: 0 %
HCT: 23.5 % — ABNORMAL LOW (ref 36.0–46.0)
Hemoglobin: 7.5 g/dL — ABNORMAL LOW (ref 12.0–15.0)
Lymphocytes Relative: 8 %
Lymphs Abs: 0.6 10*3/uL — ABNORMAL LOW (ref 0.7–4.0)
MCH: 29.8 pg (ref 26.0–34.0)
MCHC: 31.9 g/dL (ref 30.0–36.0)
MCV: 93.3 fL (ref 80.0–100.0)
Monocytes Absolute: 0.5 10*3/uL (ref 0.1–1.0)
Monocytes Relative: 6 %
Neutro Abs: 6.5 10*3/uL (ref 1.7–7.7)
Neutrophils Relative %: 84 %
Platelets: 77 10*3/uL — ABNORMAL LOW (ref 150–400)
RBC: 2.52 MIL/uL — ABNORMAL LOW (ref 3.87–5.11)
RDW: 18.9 % — ABNORMAL HIGH (ref 11.5–15.5)
WBC: 7.6 10*3/uL (ref 4.0–10.5)
nRBC: 2.2 % — ABNORMAL HIGH (ref 0.0–0.2)
nRBC: 6 /100 WBC — ABNORMAL HIGH

## 2022-04-16 LAB — PREPARE RBC (CROSSMATCH)

## 2022-04-16 MED ORDER — VALACYCLOVIR HCL 1 G PO TABS: 1000.0000 mg | ORAL_TABLET | ORAL | 0 refills | Status: DC

## 2022-04-16 MED ORDER — METFORMIN HCL 500 MG PO TABS: 500.0000 mg | ORAL_TABLET | Freq: Two times a day (BID) | ORAL | 4 refills | Status: AC

## 2022-04-16 MED ORDER — FUROSEMIDE 10 MG/ML IJ SOLN: 20.0000 mg | Freq: Once | INTRAMUSCULAR | Status: DC

## 2022-04-16 MED ORDER — SODIUM CHLORIDE 0.9% IV SOLUTION: Freq: Once | INTRAVENOUS | Status: DC

## 2022-04-16 MED ORDER — NIRMATRELVIR/RITONAVIR (PAXLOVID)TABLET: 3.0000 | ORAL_TABLET | Freq: Two times a day (BID) | ORAL | 0 refills | Status: DC

## 2022-04-16 MED ORDER — ASPIRIN EC 81 MG PO TBEC: 81.0000 mg | DELAYED_RELEASE_TABLET | Freq: Every day | ORAL | 11 refills | Status: AC

## 2022-04-16 MED ORDER — ALBUTEROL SULFATE HFA 108 (90 BASE) MCG/ACT IN AERS: 2.0000 | INHALATION_SPRAY | Freq: Two times a day (BID) | RESPIRATORY_TRACT | Status: DC

## 2022-04-16 MED ORDER — LACTULOSE 20 GM/30ML PO SOLN: 30.0000 mL | Freq: Every day | ORAL | 2 refills | Status: DC | PRN

## 2022-04-16 MED ORDER — ALBUTEROL SULFATE HFA 108 (90 BASE) MCG/ACT IN AERS: 2.0000 | INHALATION_SPRAY | RESPIRATORY_TRACT | 3 refills | Status: AC | PRN

## 2022-04-16 MED ORDER — ROSUVASTATIN CALCIUM 5 MG PO TABS: 5.0000 mg | ORAL_TABLET | Freq: Every day | ORAL | 3 refills | Status: AC

## 2022-04-16 MED ORDER — POLYETHYLENE GLYCOL 3350 17 G PO PACK: 17.0000 g | PACK | Freq: Every day | ORAL | 0 refills | Status: AC | PRN

## 2022-04-16 MED ORDER — PREDNISONE 20 MG PO TABS: 20.0000 mg | ORAL_TABLET | Freq: Every day | ORAL | 0 refills | Status: AC

## 2022-04-16 MED ORDER — HEPARIN SOD (PORK) LOCK FLUSH 100 UNIT/ML IV SOLN
500.0000 [IU] | Freq: Once | INTRAVENOUS | Status: AC
  Administered 2022-04-16: 500 [IU] via INTRAVENOUS
  Filled 2022-04-16: qty 5

## 2022-04-16 NOTE — Discharge Summary (Addendum)
Joanna Reid, is a 56 y.o. female  DOB [birth year]  MRN [id number].  Admission date:  04/12/2022  Admitting Physician  Bethena Roys, MD  Discharge Date:  04/16/2022   Primary MD  Lindell Spar, MD  Recommendations for primary care physician for things to follow:   1)Avoid ibuprofen/Advil/Aleve/Motrin/Goody Powders/Naproxen/BC powders/Meloxicam/Diclofenac/Indomethacin and other Nonsteroidal anti-inflammatory medications as these will make you more likely to bleed and can cause stomach ulcers, can also cause Kidney problems.   2)Follow up with Dr. Delton Coombes on Wednesday, 04/18/2022 for recheck and reevaluation and repeat CBC and possible transfusion of blood/packed red blood cells  3)You are strongly advised to isolate/quarantine for at least 10 days from the date of your diagnosis with COVID-19 infection--please always wear a mask if you have to go outside the house  4)Please take medications as prescribed     Admission Diagnosis  Acute bronchitis [J20.9]   Discharge Diagnosis  Acute bronchitis [J20.9]    Principal Problem:   Acute on chronic anemia Active Problems:   Multiple myeloma ([place])   Acute bronchitis   Essential hypertension, benign   Type 2 diabetes mellitus with other specified complication ([place])   Stroke (cerebrum) -Left parietal lobe, 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    COVID-19 virus infection      Past Medical History:  Diagnosis Date   Acid reflux    Allergic rhinitis    Cancer ([place])    multiple myeloma   Diabetes mellitus    type 2   Gout    Gout    H/o COVID-19--- was Positive 09/17/2019, Negative 12/23/19 AND also Neg on  12/26/19 12/26/2019   HBP (high blood pressure)    History of kidney stones    Migraines    Multiple myeloma ([place]) 10/29/2017    Past Surgical History:  Procedure Laterality Date   BREAST CYST EXCISION Left    2009 no visible  scar on skin   CESAREAN SECTION     COLONOSCOPY WITH PROPOFOL N/A 12/25/2019   Procedure: COLONOSCOPY WITH PROPOFOL;  Surgeon: Rogene Houston, MD;  [facility];  Service: Endoscopy;  Laterality: N/A;  730   EXTRACORPOREAL SHOCK WAVE LITHOTRIPSY Left 10/10/2017   Procedure: LEFT EXTRACORPOREAL SHOCK WAVE LITHOTRIPSY (ESWL);  Surgeon: Bjorn Loser, MD;  Location: [facility];  Service: Urology;  Laterality: Left;   EYE SURGERY     HEMORRHOID SURGERY N/A 11/19/2012   Procedure: HEMORRHOIDECTOMY;  Surgeon: Jamesetta So, MD;  Location: [facility];  Service: General;  Laterality: N/A;   IR IMAGING GUIDED PORT INSERTION  01/18/2022   kidney stones  1998   LAPAROSCOPIC UNILATERAL SALPINGO OOPHERECTOMY  05/14/2012   Procedure: LAPAROSCOPIC UNILATERAL SALPINGO OOPHORECTOMY;  Surgeon: [name], MD;  Location: [facility];  Service: Gynecology;  Laterality: Right;  laparoscopic right salpingo-oophorectomy   PARTIAL HYSTERECTOMY     POLYPECTOMY  12/25/2019   Procedure: POLYPECTOMY;  Surgeon: Rogene Houston, MD;  [facility];  Service: Endoscopy;;   TONSILECTOMY, ADENOIDECTOMY, BILATERAL  MYRINGOTOMY AND TUBES     VESICOVAGINAL FISTULA CLOSURE W/ TAH         HPI  from the history and physical done on the day of admission:     Chief Complaint: Difficulty breathing   HPI: Joanna Reid is a 56 y.o. female with medical history significant for multiple myeloma, obesity, gout, CVA, overactive bladder, diabetes mellitus. Patient was sent to the ED from cancer center with reports of shortness of breath, wheezing and low hemoglobin. Patient reports difficulty breathing over the past week, with associated wheezing.  She reports generalized weakness, and cough.  Reports few episodes of blood-tinged sputum.  She reports fever at home.  No lower extremity swelling.  No chest pain.  She is on chemotherapy for multiple myeloma.  Reports some episodes of vomiting, no abdominal pain and no  diarrhea. Quit smoking cigarettes about 3 years ago. Patient reports she just recovered from a bout of COVID infection on 8 August.  She had a cough, mild fever at that time.  No dyspnea or wheezing.  She did not require hospitalization.  She was treated with Paxlovid.  She got at least 3 doses of COVID-19 vaccine.   ED Course: Tmax 100.3, heart rate 80-97.  Respirate rate 17-26.  Blood pressure systolic 1 70-964.  O2 sats greater than 93% on room air. Lactic acidosis 2.2 >  3.2. Hemoglobin 6.1, baseline over the past week 7.4-8.4. CTA chest negative for PE, shows right upper lobe linear atelectasis, compression fracture deformity at C6, lytic lesions T6 and T7. IV ceftriaxone and azithromycin started.  2 L bolus given.  DuoNebs given.  Hospitalist to admit for anemia, COVID infection, bronchitis.   Review of Systems: As per HPI all other systems reviewed and negative.     Hospital Course:   Brief Narrative / Interim history: 56 year old female with multiple myeloma, obesity, gout, prior CVA, overactive bladder, DM, who comes to the hospital with shortness of breath, wheezing as well as low hemoglobin.  She has been feeling fatigue and had difficulties breathing progressive over the last week.  She reports generalized weakness as well as a cough.  She is on chemotherapy for multiple myeloma.  Apparently she recovered from a bout of COVID infection in August 8, it was mild did not require hospitalization.  She was found to be febrile during this hospital admission, with a Tmax of 100.3, CT angio showed no evidence of PE but did show some lingular and anteromedial right upper lobe linear atelectasis.    Principal problem Acute on chronic anemia - -Recent Hgb baseline has been between 7 and 8 -Suspect anemia secondary to underlying multiple myeloma with ongoing therapy  recent anemia panel 02/08/2022 with normal ferritin, serum iron, TIBC and borderline low iron saturation suggestive of anemia of  chronic disease.   -Hgb currently 7.5 after transfusion of 2 units of PRBC this admission -Follow up with Dr. Delton Coombes on Wednesday, 04/18/2022 for recheck and reevaluation and repeat CBC and possible transfusion of blood/packed red blood cells -Fatigue and dyspnea on exertion has improved significantly -No dizziness no chest pains no palpitations   Active problems Multiple myeloma ([place]) -Follows with Dr. Delton Coombes.  Status post stem cell transplant 2019. Currently on immunotherapy with daratumumab and carfilzomib. -Leukopenia has resolved -Hgb and platelets have stabilized  -No bleeding concerns   COVID-19 virus infection -COVID-19 infection initially diagnosed 02/19/2022 -Remains symptomatic and continues to test positive most likely due to immunocompromise state -She would like to complete  Paxlovid -Wheezing and respiratory symptoms have improved significantly on steroids -She has been weaned off oxygen -CRP and ferritin trending down -Discharge on prednisone 20 mg daily for additional 5 days  Bilateral leg pain -somewhat chronic, mainly in the knees. 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  Patient with mild residual speech and cognitive concerns, not new -Continue Crestor and aspirin for secondary stroke prophylaxis   Type 2 diabetes mellitus with other specified complication ([place]) -Controlled.  A1c 6.6. -Reflecting good diabetic control PTA -Resume PTA diabetic regimen -Blood glucose should improve with tapering steroids   Essential hypertension--- stable, continue Coreg and amlodipine  Morbid Obesity- -Low calorie diet, portion control and increase physical activity discussed with patient -Body mass index is 43.9 kg/m.   Discharge Condition: stable without hypoxia  Follow UP   Follow-up Information     Derek Jack, MD. Go on 04/18/2022.   Specialty:  Hematology Contact information: [place] [address] [phone]                 Consults obtained -discussed with patient's oncologist Dr. Delton Coombes  Diet and Activity recommendation:  As advised  Discharge Instructions    Discharge Instructions     Call MD for:  difficulty breathing, headache or visual disturbances   Complete by: As directed    Call MD for:  persistant dizziness or light-headedness   Complete by: As directed    Call MD for:  persistant nausea and vomiting   Complete by: As directed    Call MD for:  severe uncontrolled pain   Complete by: As directed    Call MD for:  temperature >100.4   Complete by: As directed    Diet - low sodium heart healthy   Complete by: As directed    Diet Carb Modified   Complete by: As directed    Discharge instructions   Complete by: As directed    1)Avoid ibuprofen/Advil/Aleve/Motrin/Goody Powders/Naproxen/BC powders/Meloxicam/Diclofenac/Indomethacin and other Nonsteroidal anti-inflammatory medications as these will make you more likely to bleed and can cause stomach ulcers, can also cause Kidney problems.   2)Follow up with Dr. Delton Coombes on Wednesday, 04/18/2022 for recheck and reevaluation and repeat CBC and possible transfusion of blood/packed red blood cells  3)You are strongly advised to isolate/quarantine for at least 10 days from the date of your diagnosis with COVID-19 infection--please always wear a mask if you have to go outside the house  4)Please take medications as prescribed   Increase activity slowly   Complete by: As directed         Discharge Medications     Allergies as of 04/16/2022   No Known Allergies      Medication List     STOP taking these medications    cyclobenzaprine 5 MG tablet Commonly known as: FLEXERIL       TAKE these medications    acyclovir 400 MG tablet Commonly known as: ZOVIRAX TAKE (1) TABLET BY MOUTH TWICE DAILY.   albuterol 108 (90 Base)  MCG/ACT inhaler Commonly known as: VENTOLIN HFA Inhale 2 puffs into the lungs every 4 (four) hours as needed for wheezing or shortness of breath.   allopurinol 300 MG tablet Commonly known as: ZYLOPRIM TAKE (1) TABLET BY MOUTH ONCE DAILY. What changed: See the new instructions.   amLODipine 2.5 MG tablet Commonly known as: NORVASC Take 2.5 mg by mouth daily.   aspirin  EC 81 MG tablet Take 1 tablet (81 mg total) by mouth daily with breakfast. For stroke prophylaxis What changed:  when to take this additional instructions   BD Pen Needle Nano U/F 32G X 4 MM Misc Generic drug: Insulin Pen Needle Inject into the skin.   bumetanide 0.5 MG tablet Commonly known as: BUMEX Take 0.5 mg by mouth daily.   carvedilol 25 MG tablet Commonly known as: COREG Take 1 tablet (25 mg total) by mouth 2 (two) times daily.   Darzalex Faspro 1800-30000 MG-UT/15ML Soln Generic drug: daratumumab-hyaluronidase-fihj Inject 15 ml over 3-5 minutes subcutaneously once every 2 weeks.   diclofenac Sodium 1 % Gel Commonly known as: Voltaren Apply 2 g topically 4 (four) times daily.   docusate sodium 100 MG capsule Commonly known as: COLACE Take 100 mg by mouth daily.   Dulaglutide 1.5 MG/0.5ML Sopn Inject 1.5 mg into the skin every 7 (seven) days.   escitalopram 10 MG tablet Commonly known as: LEXAPRO TAKE (1) TABLET BY MOUTH ONCE DAILY. MAY START WITH 1/2 TABLET FOR 7 DAYS. What changed: See the new instructions.   fluticasone 50 MCG/ACT nasal spray Commonly known as: FLONASE INSTILL 2 SPRAYS INTO BOTH NOSTRILS DAILY   Insulin Lispro w/ Trans Port 100 UNIT/ML Sopn Inject 20 Units into the skin See admin instructions. Inject before meals per sliding scale Max TDD 20 units   Kyprolis 30 MG Solr Generic drug: carfilzomib Inject into the vein.   Lactulose 20 GM/30ML Soln Take 30 mLs (20 g total) by mouth daily as needed. What changed:  when to take this reasons to take this    lidocaine-prilocaine cream Commonly known as: EMLA Apply 1 Application topically as needed (Apply to port prior to treatment and flushes).   LORazepam 0.5 MG tablet Commonly known as: ATIVAN Take 1 tablet (0.5 mg total) by mouth every 8 (eight) hours.   losartan 100 MG tablet Commonly known as: COZAAR Take 100 mg by mouth daily.   magnesium oxide 400 (241.3 Mg) MG tablet Commonly known as: MAG-OX Take 1 tablet (400 mg total) by mouth 3 (three) times daily.   metFORMIN 500 MG tablet Commonly known as: GLUCOPHAGE Take 1 tablet (500 mg total) by mouth 2 (two) times daily.   nirmatrelvir/ritonavir EUA 20 x 150 MG & 10 x 100MG Tabs Commonly known as: PAXLOVID Take 3 tablets by mouth 2 (two) times daily for 5 days. Complete packet given to you at the hospital   nitrofurantoin (macrocrystal-monohydrate) 100 MG capsule Commonly known as: MACROBID TAKE (1) CAPSULE BY MOUTH AT BEDTIME. What changed: See the new instructions.   NovoLOG FlexPen 100 UNIT/ML FlexPen Generic drug: insulin aspart Inject into the skin.   oxyCODONE-acetaminophen 5-325 MG tablet Commonly known as: PERCOCET/ROXICET Take 1 tablet by mouth 2 (two) times daily as needed for severe pain.   oxyCODONE-acetaminophen 5-325 MG tablet Commonly known as: PERCOCET/ROXICET One tablet every four hours as need for pain,  5 day limit.   pantoprazole 40 MG tablet Commonly known as: PROTONIX TAKE (1) TABLET BY MOUTH ONCE DAILY. What changed: See the new instructions.   polyethylene glycol 17 g packet Commonly known as: MIRALAX / GLYCOLAX Take 17 g by mouth daily as needed for mild constipation.   predniSONE 20 MG tablet Commonly known as: DELTASONE Take 1 tablet (20 mg total) by mouth daily with breakfast for 5 days.   pregabalin 200 MG capsule Commonly known as: LYRICA TAKE (1) CAPSULE BY MOUTH TWICE DAILY. What changed: See  the new instructions.   rosuvastatin 5 MG tablet Commonly known as: CRESTOR Take 1  tablet (5 mg total) by mouth daily.   scopolamine 1 MG/3DAYS Commonly known as: TRANSDERM-SCOP Place 1 patch (1.5 mg total) onto the skin every 3 (three) days.   Systane Balance 0.6 % Soln Generic drug: Propylene Glycol Apply 1 drop to eye daily as needed (dry eye).   valACYclovir 1000 MG tablet Commonly known as: VALTREX Take 1 tablet (1,000 mg total) by mouth See admin instructions. TAKE 2 TABLETS BY MOUTH NOW; THEN 2 tab about 12 HOURS LATER--- for outbreaks What changed: See the new instructions.        Major procedures and Radiology Reports - PLEASE review detailed and final reports for all details, in brief -   US Venous Img Lower Bilateral (DVT)  Result Date: 04/13/2022 CLINICAL DATA:  Bilateral lower extremity pain EXAM: BILATERAL LOWER EXTREMITY VENOUS DOPPLER ULTRASOUND TECHNIQUE: Gray-scale sonography with graded compression, as well as color Doppler and duplex ultrasound were performed to evaluate the lower extremity deep venous systems from the level of the common femoral vein and including the common femoral, femoral, profunda femoral, popliteal and calf veins including the posterior tibial, peroneal and gastrocnemius veins when visible. The superficial great saphenous vein was also interrogated. Spectral Doppler was utilized to evaluate flow at rest and with distal augmentation maneuvers in the common femoral, femoral and popliteal veins. COMPARISON:  None Available. FINDINGS: RIGHT LOWER EXTREMITY Common Femoral Vein: No evidence of thrombus. Normal compressibility, respiratory phasicity and response to augmentation. Saphenofemoral Junction: No evidence of thrombus. Normal compressibility and flow on color Doppler imaging. Profunda Femoral Vein: No evidence of thrombus. Normal compressibility and flow on color Doppler imaging. Femoral Vein: No evidence of thrombus. Normal compressibility, respiratory phasicity and response to augmentation. Popliteal Vein: No evidence of  thrombus. Normal compressibility, respiratory phasicity and response to augmentation. Calf Veins: No evidence of thrombus. Normal compressibility and flow on color Doppler imaging. LEFT LOWER EXTREMITY Common Femoral Vein: No evidence of thrombus. Normal compressibility, respiratory phasicity and response to augmentation. Saphenofemoral Junction: No evidence of thrombus. Normal compressibility and flow on color Doppler imaging. Profunda Femoral Vein: No evidence of thrombus. Normal compressibility and flow on color Doppler imaging. Femoral Vein: No evidence of thrombus. Normal compressibility, respiratory phasicity and response to augmentation. Popliteal Vein: No evidence of thrombus. Normal compressibility, respiratory phasicity and response to augmentation. Calf Veins: No evidence of thrombus. Normal compressibility and flow on color Doppler imaging. Other Findings:  None. IMPRESSION: No evidence of deep venous thrombosis in either lower extremity. Electronically Signed   By: Albin Felling M.D.   On: 04/13/2022 12:29   CT Angio Chest PE W and/or Wo Contrast  Result Date: 04/12/2022 CLINICAL DATA:  Shortness of breath and wheezing. EXAM: CT ANGIOGRAPHY CHEST WITH CONTRAST TECHNIQUE: Multidetector CT imaging of the chest was performed using the standard protocol during bolus administration of intravenous contrast. Multiplanar CT image reconstructions and MIPs were obtained to evaluate the vascular anatomy. RADIATION DOSE REDUCTION: This exam was performed according to the departmental dose-optimization program which includes automated exposure control, adjustment of the mA and/or kV according to patient size and/or use of iterative reconstruction technique. CONTRAST:  1m OMNIPAQUE IOHEXOL 350 MG/ML SOLN COMPARISON:  March 29, 2022 FINDINGS: Cardiovascular: A right-sided venous Port-A-Cath is in place. There is mild calcification of the descending thoracic aorta, without evidence of aortic aneurysm. The  subsegmental pulmonary arteries are limited in evaluation secondary to suboptimal opacification with  intravenous contrast. No evidence of pulmonary embolism. Normal heart size with marked severity coronary artery calcification. No pericardial effusion. Mediastinum/Nodes: No enlarged mediastinal, hilar, or axillary lymph nodes. Thyroid gland, trachea, and esophagus demonstrate no significant findings. Lungs/Pleura: Mild lingular and mild anteromedial right upper lobe linear atelectasis is seen. There is no evidence of acute infiltrate, pleural effusion or pneumothorax. Upper Abdomen: There is diffuse fatty infiltration of the liver parenchyma. Musculoskeletal: A chronic compression fracture deformity is seen at the level of T6 vertebral body. Sclerotic changes are also seen within this region. Small lytic lesions are also seen at the level of T6 and T7. Review of the MIP images confirms the above findings. IMPRESSION: 1. No evidence of pulmonary embolism. 2. Mild lingular and mild anteromedial right upper lobe linear atelectasis. 3. Chronic compression fracture deformity at the level of T6 vertebral body. 4. Small lytic lesions at the level of T6 and T7. 5. Hepatic steatosis. 6. Aortic atherosclerosis. Aortic Atherosclerosis (ICD10-I70.0). Electronically Signed   By: Virgina Norfolk M.D.   On: 04/12/2022 19:59   DG Chest Port 1 View  Result Date: 04/12/2022 CLINICAL DATA:  Sepsis, short of breath, wheezing, anemia, multiple myeloma EXAM: PORTABLE CHEST 1 VIEW COMPARISON:  02/19/2022 FINDINGS: Single frontal view of the chest demonstrates stable right chest wall port. The cardiac silhouette is unremarkable. No airspace disease, effusion, or pneumothorax. No acute bony abnormality. IMPRESSION: 1. No acute intrathoracic process. Electronically Signed   By: Randa Ngo M.D.   On: 04/12/2022 15:26   MR CERVICAL SPINE W WO CONTRAST  Result Date: 03/30/2022 CLINICAL DATA:  Multiple myeloma. Left shoulder and  upper extremity pain and numbness. EXAM: MRI CERVICAL SPINE WITHOUT AND WITH CONTRAST TECHNIQUE: Multiplanar and multiecho pulse sequences of the cervical spine, to include the craniocervical junction and cervicothoracic junction, were obtained without and with intravenous contrast. CONTRAST:  10 cc Vueway COMPARISON:  Cervical spine radiographs 03/27/2022. CTA of the neck 12/26/2019. FINDINGS: Alignment: Straightening without focal angulation or listhesis. Vertebrae: No evidence of acute fracture or traumatic subluxation. Heterogeneous marrow signal with grossly stable predominately sclerotic lesion in the left aspect of the C5 vertebral body compared with previous CTA. Nonspecific decreased T1 and increased T2 signal within the C6 and C7 vertebral bodies with low level enhancement following contrast. There is also a small enhancing lesion within the T2 vertebral body (best seen on sagittal image 10/11). Cord: Normal in signal and caliber. No abnormal intradural enhancement. Posterior Fossa, vertebral arteries, paraspinal tissues: Visualized portions of the posterior fossa appear unremarkable.Bilateral vertebral artery flow voids. No significant paraspinal findings. Disc levels: No significant disc space findings at or above C3-4. C4-5: Chronic spondylosis with loss of disc height and posterior osteophytes covering diffusely bulging disc material. The CSF surrounding the cord is partially effaced, but there is no cord deformity. Moderate foraminal narrowing bilaterally. C5-6: Mild disc bulging and uncinate spurring. Mild foraminal narrowing bilaterally. C6-7: Mild disc bulging and uncinate spurring. The spinal canal and foramina are patent. C7-T1: No significant findings. IMPRESSION: 1. No acute cervical spine findings. 2. Spondylosis as described, greatest at C4-5 where there is mild resulting spinal stenosis and moderate foraminal narrowing bilaterally. Lesser spondylosis at C5-6 with mild foraminal narrowing  bilaterally. 3. Nonspecific marrow heterogeneity with foci of enhancement at C6, C7 and T2. The findings in the cervical spine are suspected to be discogenic given adjacent disc disease. Consider follow-up in approximately 6 months, as clinically warranted. No acute osseous findings. Electronically Signed   By: Gwyndolyn Saxon  Lin Landsman M.D.   On: 03/30/2022 11:58   CT Angio Chest PE W and/or Wo Contrast  Result Date: 03/29/2022 CLINICAL DATA:  Suspected pulmonary embolus. EXAM: CT ANGIOGRAPHY CHEST WITH CONTRAST TECHNIQUE: Multidetector CT imaging of the chest was performed using the standard protocol during bolus administration of intravenous contrast. Multiplanar CT image reconstructions and MIPs were obtained to evaluate the vascular anatomy. RADIATION DOSE REDUCTION: This exam was performed according to the departmental dose-optimization program which includes automated exposure control, adjustment of the mA and/or kV according to patient size and/or use of iterative reconstruction technique. CONTRAST:  46m OMNIPAQUE IOHEXOL 350 MG/ML SOLN COMPARISON:  None Available. FINDINGS: Cardiovascular: Satisfactory opacification of the pulmonary arteries to the segmental level. No evidence of pulmonary embolism. Normal heart size. No pericardial effusion. Mediastinum/Nodes: No enlarged mediastinal, hilar, or axillary lymph nodes. Thyroid gland, trachea, and esophagus demonstrate no significant findings. Lungs/Pleura: Scattered atelectatic changes, otherwise clear. Upper Abdomen: No acute abnormality. Musculoskeletal: Sclerotic changes, remodeling and compression deformity of T6 vertebral body. Small lytic lesions in multiple other thoracic vertebral bodies without pathologic fracture. Review of the MIP images confirms the above findings. IMPRESSION: 1. No evidence of pulmonary embolus. 2. Sclerotic changes, remodeling and compression deformity of T6 vertebral body, with small lytic lesions in multiple other thoracic  vertebral bodies without pathologic fracture. These are likely sequela multiple myeloma. Electronically Signed   By: DFidela SalisburyM.D.   On: 03/29/2022 16:00   UKoreaVenous Img Upper Uni Left (DVT)  Result Date: 03/29/2022 CLINICAL DATA:  56year old female with left arm pain and numbness EXAM: LEFT UPPER EXTREMITY VENOUS DOPPLER ULTRASOUND TECHNIQUE: Gray-scale sonography with graded compression, as well as color Doppler and duplex ultrasound were performed to evaluate the upper extremity deep venous system from the level of the subclavian vein and including the jugular, axillary, basilic, radial, ulnar and upper cephalic vein. Spectral Doppler was utilized to evaluate flow at rest and with distal augmentation maneuvers. COMPARISON:  None Available. FINDINGS: Contralateral Subclavian Vein: Respiratory phasicity is normal and symmetric with the symptomatic side. No evidence of thrombus. Normal compressibility. Internal Jugular Vein: No evidence of thrombus. Normal compressibility, respiratory phasicity and response to augmentation. Subclavian Vein: No evidence of thrombus. Normal compressibility, respiratory phasicity and response to augmentation. Axillary Vein: No evidence of thrombus. Normal compressibility, respiratory phasicity and response to augmentation. Cephalic Vein: No evidence of thrombus. Normal compressibility, respiratory phasicity and response to augmentation. Basilic Vein: No evidence of thrombus. Normal compressibility, respiratory phasicity and response to augmentation. Brachial Veins: No evidence of thrombus. Normal compressibility, respiratory phasicity and response to augmentation. Radial Veins: No evidence of thrombus. Normal compressibility, respiratory phasicity and response to augmentation. Ulnar Veins: No evidence of thrombus. Normal compressibility, respiratory phasicity and response to augmentation. Other Findings:  None visualized. IMPRESSION: Directed duplex of the left upper  extremity negative for DVT Signed, JDulcy Fanny WNadene Rubins RPVI [facility] Electronically Signed   By: JCorrie MckusickD.O.   On: 03/29/2022 15:27   DG Cervical Spine Complete  Result Date: 03/27/2022 Clinical:  left sided paresthesias, no trauma, history of multiple myeloma X-rays were done of the cervical spine, five views. There is loss of the normal lordosis of the neck.  There are degenerative changes of lower cervical spine with narrowing at C5-C5, C5-C6 and C6-C7 with small anterior osteophytes.  No fracture is noted. Bone quality is good. Impression:  diffuse lower cervical spine degenerative changes, no acute findings. Electronically SHarvard  Luna Glasgow, MD 9/19/202311:33 AM   Micro Results   Recent Results (from the past 240 hour(s))  Culture, blood (Routine x 2)     Status: None (Preliminary result)   Collection Time: 04/12/22  2:49 PM   Specimen: Left Antecubital; Blood  Result Value Ref Range Status   Specimen Description LEFT ANTECUBITAL  Final   Special Requests   Final    BOTTLES DRAWN AEROBIC AND ANAEROBIC Blood Culture adequate volume   Culture   Final    NO GROWTH 4 DAYS Performed at [facility], [address] [place], [place] [address]    Report Status PENDING  Incomplete  Culture, blood (Routine x 2)     Status: None (Preliminary result)   Collection Time: 04/12/22  2:49 PM   Specimen: Right Antecubital; Blood  Result Value Ref Range Status   Specimen Description RIGHT ANTECUBITAL  Final   Special Requests   Final    BOTTLES DRAWN AEROBIC AND ANAEROBIC Blood Culture results may not be optimal due to an excessive volume of blood received in culture bottles   Culture   Final    NO GROWTH 4 DAYS Performed at [facility], [address] [place], [place] [address]    Report Status PENDING  Incomplete  Resp Panel by RT-PCR (Flu A&B, Covid) Anterior Nasal Swab     Status: Abnormal   Collection Time:  04/12/22  4:29 PM   Specimen: Anterior Nasal Swab  Result Value Ref Range Status   SARS Coronavirus 2 by RT PCR POSITIVE (A) NEGATIVE Final    Comment: (NOTE) SARS-CoV-2 target nucleic acids are DETECTED.  The SARS-CoV-2 RNA is generally detectable in upper respiratory specimens during the acute phase of infection. Positive results are indicative of the presence of the identified virus, but do not rule out bacterial infection or co-infection with other pathogens not detected by the test. Clinical correlation with patient history and other diagnostic information is necessary to determine patient infection status. The expected result is Negative.  Fact Sheet for Patients: EntrepreneurPulse.com.au  Fact Sheet for Healthcare Providers: IncredibleEmployment.be  This test is not yet approved or cleared by the [place] FDA and  has been authorized for detection and/or diagnosis of SARS-CoV-2 by FDA under an Emergency Use Authorization (EUA).  This EUA will remain in effect (meaning this test can be used) for the duration of  the COVID-19 declaration under Section 564(b)(1) of the A ct, 21 U.S.C. section 360bbb-3(b)(1), unless the authorization is terminated or revoked sooner.     Influenza A by PCR NEGATIVE NEGATIVE Final   Influenza B by PCR NEGATIVE NEGATIVE Final    Comment: (NOTE) The Xpert Xpress SARS-CoV-2/FLU/RSV plus assay is intended as an aid in the diagnosis of influenza from Nasopharyngeal swab specimens and should not be used as a sole basis for treatment. Nasal washings and aspirates are unacceptable for Xpert Xpress SARS-CoV-2/FLU/RSV testing.  Fact Sheet for Patients: EntrepreneurPulse.com.au  Fact Sheet for Healthcare Providers: IncredibleEmployment.be  This test is not yet approved or cleared by the [place] FDA and has been authorized for detection and/or diagnosis of SARS-CoV-2  by FDA under an Emergency Use Authorization (EUA). This EUA will remain in effect (meaning this test can be used) for the duration of the COVID-19 declaration under Section 564(b)(1) of the Act, 21 U.S.C. section 360bbb-3(b)(1), unless the authorization is terminated or revoked.  Performed at [facility], [address], [place], [place] [address]   MRSA Next Gen by PCR, Nasal  Status: None   Collection Time: 04/13/22 10:09 AM   Specimen: Nasal Mucosa; Nasal Swab  Result Value Ref Range Status   MRSA by PCR Next Gen NOT DETECTED NOT DETECTED Final    Comment: (NOTE) The GeneXpert MRSA Assay (FDA approved for NASAL specimens only), is one component of a comprehensive MRSA colonization surveillance program. It is not intended to diagnose MRSA infection nor to guide or monitor treatment for MRSA infections. Test performance is not FDA approved in patients less than 15 years old. Performed at [facility], [address] [place], [place] [address]     Today   Subjective    Joanna Reid today has no new complaints -Dyspnea fatigue has improved significantly -No bleeding concerns -Hypoxia and wheezing is resolved -Cough is improved significantly No fever  Or chills   No Nausea, Vomiting or Diarrhea   Patient has been seen and examined prior to discharge   Objective   Blood pressure 138/75, pulse (!) 47, temperature (!) 97.5 F (36.4 C), temperature source Oral, resp. rate 20, height 5' 4"  (1.626 m), weight 116 kg, SpO2 99 %.   Intake/Output Summary (Last 24 hours) at 04/16/2022 1749 Last data filed at 04/16/2022 0646 Gross per 24 hour  Intake 240 ml  Output --  Net 240 ml   Exam Gen:- Awake Alert, in no acute distress  HEENT:- [place].AT, No sclera icterus Neck-Supple Neck,No JVD,.  Lungs-mostly clear, fair air movement bilaterally  CV- S1, S2 normal, RRR, right chest Port-A-Cath site clean dry intact Abd-  +ve B.Sounds, Abd Soft, No tenderness, increased truncal  adiposity Extremity/Skin:- No  edema,   good pedal pulses  Psych-affect is appropriate, oriented x3 Neuro- no new focal deficits, no tremors   Data Review   CBC w Diff:  Lab Results  Component Value Date   WBC 7.6 04/16/2022   HGB 7.5 (L) 04/16/2022   HGB 8.9 (L) 01/30/2022   HCT 23.5 (L) 04/16/2022   HCT 28.4 (L) 01/30/2022   PLT 77 (L) 04/16/2022   PLT 189 01/30/2022   LYMPHOPCT 8 04/16/2022   BANDSPCT 2 04/16/2022   MONOPCT 6 04/16/2022   EOSPCT 0 04/16/2022   BASOPCT 0 04/16/2022    CMP:  Lab Results  Component Value Date   NA 139 04/16/2022   NA 137 01/30/2022   K 4.4 04/16/2022   CL 111 04/16/2022   CO2 20 (L) 04/16/2022   BUN 30 (H) 04/16/2022   BUN 18 01/30/2022   CREATININE 0.98 04/16/2022   CREATININE 0.69 12/08/2014   PROT 5.4 (L) 04/16/2022   PROT 6.0 01/30/2022   ALBUMIN 2.5 (L) 04/16/2022   ALBUMIN 3.4 (L) 01/30/2022   BILITOT 0.5 04/16/2022   BILITOT 0.3 01/30/2022   ALKPHOS 112 04/16/2022   AST 18 04/16/2022   ALT 22 04/16/2022  . Total Discharge time is about 33 minutes  Roxan Hockey M.D on 04/16/2022 at 5:49 PM  Go to [email or web address] -  for contact info  [facility] - Office  [phone]

## 2022-04-16 NOTE — Inpatient Diabetes Management (Signed)
Inpatient Diabetes Program Recommendations  AACE/ADA: New Consensus Statement on Inpatient Glycemic Control  Target Ranges:  Prepandial:   less than 140 mg/dL      Peak postprandial:   less than 180 mg/dL (1-2 hours)      Critically ill patients:  140 - 180 mg/dL    Latest Reference Range & Units 04/15/22 09:28 04/15/22 11:59 04/15/22 16:41 04/15/22 21:14 04/16/22 07:40  Glucose-Capillary 70 - 99 mg/dL 285 (H) 211 (H) 249 (H) 382 (H) 163 (H)   Review of Glycemic Control  Diabetes history: DM22 Outpatient Diabetes medications: Trulicity 1.5 mg Qweek, Metformin 500 mg BID, Lispro up to 20 units with meals Current orders for Inpatient glycemic control: Novolog 0-15 units TID with meals, Novolog 0-5 units QHS  Inpatient Diabetes Program Recommendations:    Insulin: Noted patient received Solumedrol 40 mg twice on 04/15/22 and steroids are no longer ordered. Please consider ordering Novolog 3 units TID with meals for meal coverage if patient eats at least 50% of meals.  Thanks, Barnie Alderman, RN, MSN, [place] Diabetes Coordinator Inpatient Diabetes Program [phone] (Team Pager from 8am to [place])

## 2022-04-16 NOTE — Discharge Instructions (Signed)
1)Avoid ibuprofen/Advil/Aleve/Motrin/Goody Powders/Naproxen/BC powders/Meloxicam/Diclofenac/Indomethacin and other Nonsteroidal anti-inflammatory medications as these will make you more likely to bleed and can cause stomach ulcers, can also cause Kidney problems.   2)Follow up with Dr. Delton Coombes on Wednesday, 04/18/2022 for recheck and reevaluation and repeat CBC and possible transfusion of blood/packed red blood cells  3)You are strongly advised to isolate/quarantine for at least 10 days from the date of your diagnosis with COVID-19 infection--please always wear a mask if you have to go outside the house  4)Please take medications as prescribed

## 2022-04-16 NOTE — Progress Notes (Signed)
Patient's heart rate in the 40s this shift while patient was asleep. Notified MD.

## 2022-04-16 NOTE — Progress Notes (Signed)
Deaccessed patients port, flushed with heparin prior to deaccess, patient tolerated well. Gauze dressing applied. Patients discharge paper work given and reviewed with patient. Patient transported via wheelchair to private vehicle.

## 2022-04-17 ENCOUNTER — Telehealth: Payer: Self-pay | Admitting: *Deleted

## 2022-04-17 LAB — CULTURE, BLOOD (ROUTINE X 2)
Culture: NO GROWTH
Culture: NO GROWTH
Special Requests: ADEQUATE

## 2022-04-17 NOTE — Telephone Encounter (Signed)
Transition Care Management Unsuccessful Follow-up Telephone Call  Date of discharge and from where:  04-16-22 Joanna Reid  Attempts:  1st Attempt  Reason for unsuccessful TCM follow-up call:  Left voice message

## 2022-04-18 ENCOUNTER — Other Ambulatory Visit: Payer: Self-pay | Admitting: *Deleted

## 2022-04-18 ENCOUNTER — Inpatient Hospital Stay: Payer: BC Managed Care – PPO

## 2022-04-18 ENCOUNTER — Ambulatory Visit (HOSPITAL_COMMUNITY): Payer: BC Managed Care – PPO | Admitting: Occupational Therapy

## 2022-04-18 ENCOUNTER — Telehealth: Payer: Self-pay | Admitting: *Deleted

## 2022-04-18 VITALS — BP 137/77 | HR 56 | Temp 97.9°F | Resp 18

## 2022-04-18 DIAGNOSIS — D649 Anemia, unspecified: Secondary | ICD-10-CM | POA: Diagnosis present

## 2022-04-18 DIAGNOSIS — C9000 Multiple myeloma not having achieved remission: Secondary | ICD-10-CM

## 2022-04-18 MED ORDER — DIPHENHYDRAMINE HCL 25 MG PO CAPS: 25.0000 mg | ORAL_CAPSULE | Freq: Once | ORAL | Status: DC

## 2022-04-18 MED ORDER — HEPARIN SOD (PORK) LOCK FLUSH 100 UNIT/ML IV SOLN
500.0000 [IU] | Freq: Every day | INTRAVENOUS | Status: AC | PRN
  Administered 2022-04-18: 500 [IU]

## 2022-04-18 MED ORDER — SODIUM CHLORIDE 0.9% FLUSH
10.0000 mL | INTRAVENOUS | Status: AC | PRN
  Administered 2022-04-18: 10 mL

## 2022-04-18 MED ORDER — SODIUM CHLORIDE 0.9% IV SOLUTION
250.0000 mL | Freq: Once | INTRAVENOUS | Status: AC
  Administered 2022-04-18: 250 mL via INTRAVENOUS

## 2022-04-18 NOTE — Telephone Encounter (Signed)
Transition Care Management Follow-up Telephone Call Date of discharge and from where: 04-16-22 [facility] How have you been since you were released from the hospital? Doing good breathing better Any questions or concerns? Yes  Items Reviewed: Did the pt receive and understand the discharge instructions provided? Yes  Medications obtained and verified? Yes  Other? No  Any new allergies since your discharge? No  Dietary orders reviewed? Yes Do you have support at home? Yes   Home Care and Equipment/Supplies: Were home health services ordered? not applicable If so, what is the name of the agency? NA  Has the agency set up a time to come to the patient's home? not applicable Were any new equipment or medical supplies ordered?  No What is the name of the medical supply agency? NA Were you able to get the supplies/equipment? not applicable Do you have any questions related to the use of the equipment or supplies? No  Functional Questionnaire: (I = Independent and D = Dependent) ADLs: i  Bathing/Dressing- i  Meal Prep- i  Eating- i  Maintaining continence- i  Transferring/Ambulation- i  Managing Meds- i  Follow up appointments reviewed:  PCP Hospital f/u appt confirmed? Yes  Scheduled to see Posey Pronto on 04-19-22 @ 1120. [place] [facility] f/u appt confirmed? Yes   Are transportation arrangements needed? No  If their condition worsens, is the pt aware to call PCP or go to the Emergency Dept.? Yes Was the patient provided with contact information for the PCP's office or ED? Yes Was to pt encouraged to call back with questions or concerns? Yes

## 2022-04-18 NOTE — Telephone Encounter (Signed)
TOC APPT MADE WITH PATEL will follow up then

## 2022-04-18 NOTE — Telephone Encounter (Signed)
Daughter called in returning [facility] call. Patient is currently at oncologist office. Can call back on daughters number.   ER did not send patient home with paxlovid , patient needs about 5 more days.

## 2022-04-18 NOTE — Progress Notes (Signed)
Pt presents today for 1 unit of PRBC's per provider's order. Vital signs stable and pt voiced no new complaints at this time.  1 unit of PRBC's given today per MD orders. Tolerated infusion without adverse affects. Vital signs stable. No complaints at this time. Discharged from clinic ambulatory in stable condition. Alert and oriented x 3. F/U with [facility] as scheduled.

## 2022-04-19 ENCOUNTER — Telehealth (INDEPENDENT_AMBULATORY_CARE_PROVIDER_SITE_OTHER): Payer: BC Managed Care – PPO | Admitting: Internal Medicine

## 2022-04-19 ENCOUNTER — Encounter: Payer: Self-pay | Admitting: Internal Medicine

## 2022-04-19 DIAGNOSIS — J208 Acute bronchitis due to other specified organisms: Secondary | ICD-10-CM

## 2022-04-19 DIAGNOSIS — Z09 Encounter for follow-up examination after completed treatment for conditions other than malignant neoplasm: Secondary | ICD-10-CM

## 2022-04-19 DIAGNOSIS — D649 Anemia, unspecified: Secondary | ICD-10-CM

## 2022-04-19 DIAGNOSIS — N200 Calculus of kidney: Secondary | ICD-10-CM | POA: Diagnosis not present

## 2022-04-19 DIAGNOSIS — R1031 Right lower quadrant pain: Secondary | ICD-10-CM

## 2022-04-19 LAB — TYPE AND SCREEN
ABO/RH(D): A POS
Antibody Screen: POSITIVE
Donor AG Type: NEGATIVE
Unit division: 0

## 2022-04-19 LAB — BPAM RBC
Blood Product Expiration Date: 202311032359
ISSUE DATE / TIME: 202310110917
Unit Type and Rh: 6200

## 2022-04-19 NOTE — Progress Notes (Signed)
Virtual Visit via Video Note   Because of Joanna Reid's co-morbid illnesses, she is at least at moderate risk for complications without adequate follow up.  This format is felt to be most appropriate for this patient at this time.  All issues noted in this document were discussed and addressed.  A limited physical exam was performed with this format.     Evaluation Performed:  Follow-up visit  Date:  04/19/2022   ID:  FERNANDE TREIBER, DOB 04/02/1966, MRN [id number]  Patient Location: Home Provider Location: Office/Clinic  Participants: Patient Location of Patient: Home Location of Provider: Telehealth Consent was obtain for visit to be over via telehealth. I verified that I am speaking with the correct person using two identifiers.  PCP:  Lindell Spar, MD   Chief Complaint: Hospital discharge follow-up  History of Present Illness:    Joanna Reid is a 56 y.o. 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  She was admitted on 10/05 for complaint of dyspnea and fatigue.  She was found to have acute on chronic anemia and acute bronchitis.  She was given 2 U PRBC transfusions, which improved her Hb to 7.5 from 5.6.  She was given Paxlovid and steroids for acute bronchitis due to COVID-19 infection.  She was discharged on 10/09 with oral prednisone and Paxlovid.  Of note, she has completed Paxlovid for 5 days.  She denies any dyspnea or wheezing currently.  She received 1 more unit of PRBC yesterday.  Denies any active signs of bleeding including melena, hematochezia, hematuria or easy bruising.  She complains of right-sided lower quadrant abdominal pain, but denies any nausea, vomiting, constipation or diarrhea currently.  Her pain is constant, unrelated to activity, sharp, nonradiating. Denies any fever or  chills recently.  She has history of recurrent nephrolithiasis, for which she sees urology at [facility].  The patient does not have symptoms concerning for COVID-19 infection (fever, chills, cough, or new shortness of breath).   Past Medical, Surgical, Social History, Allergies, and Medications have been Reviewed.  Past Medical History:  Diagnosis Date   Acid reflux    Allergic rhinitis    Cancer ([place])    multiple myeloma   Diabetes mellitus    type 2   Gout    Gout    H/o COVID-19--- was Positive 09/17/2019, Negative 12/23/19 AND also Neg on  12/26/19 12/26/2019   HBP (high blood pressure)    History of kidney stones    Migraines    Multiple myeloma ([place]) 10/29/2017   Past Surgical History:  Procedure Laterality Date   BREAST CYST EXCISION Left    2009 no visible scar on skin   CESAREAN SECTION     COLONOSCOPY WITH PROPOFOL N/A 12/25/2019   Procedure: COLONOSCOPY WITH PROPOFOL;  Surgeon: Rogene Houston, MD;  Location: [facility];  Service: Endoscopy;  Laterality: N/A;  730   EXTRACORPOREAL SHOCK WAVE LITHOTRIPSY Left 10/10/2017   Procedure: LEFT EXTRACORPOREAL SHOCK WAVE LITHOTRIPSY (ESWL);  Surgeon: Bjorn Loser, MD;  Location: [facility];  Service: Urology;  Laterality: Left;   EYE SURGERY     HEMORRHOID SURGERY N/A 11/19/2012   Procedure: HEMORRHOIDECTOMY;  Surgeon: Jamesetta So, MD;  Location: [facility] ORS;  Service: General;  Laterality: N/A;   IR IMAGING GUIDED PORT INSERTION  01/18/2022  kidney stones  1998   LAPAROSCOPIC UNILATERAL SALPINGO OOPHERECTOMY  05/14/2012   Procedure: LAPAROSCOPIC UNILATERAL SALPINGO OOPHORECTOMY;  Surgeon: [name], MD;  Location: [facility];  Service: Gynecology;  Laterality: Right;  laparoscopic right salpingo-oophorectomy   PARTIAL HYSTERECTOMY     POLYPECTOMY  12/25/2019   Procedure: POLYPECTOMY;  Surgeon: Rogene Houston, MD;  Location: [facility];  Service: Endoscopy;;   TONSILECTOMY, ADENOIDECTOMY, BILATERAL MYRINGOTOMY AND TUBES      VESICOVAGINAL FISTULA CLOSURE W/ TAH       Current Meds  Medication Sig   acyclovir (ZOVIRAX) 400 MG tablet TAKE (1) TABLET BY MOUTH TWICE DAILY.   albuterol (VENTOLIN HFA) 108 (90 Base) MCG/ACT inhaler Inhale 2 puffs into the lungs every 4 (four) hours as needed for wheezing or shortness of breath.   allopurinol (ZYLOPRIM) 300 MG tablet TAKE (1) TABLET BY MOUTH ONCE DAILY. (Patient taking differently: Take 300 mg by mouth daily.)   amLODipine (NORVASC) 2.5 MG tablet Take 2.5 mg by mouth daily.   aspirin EC 81 MG tablet Take 1 tablet (81 mg total) by mouth daily with breakfast. For stroke prophylaxis   BD PEN NEEDLE NANO U/F 32G X 4 MM MISC Inject into the skin.   bumetanide (BUMEX) 0.5 MG tablet Take 0.5 mg by mouth daily.    carvedilol (COREG) 25 MG tablet Take 1 tablet (25 mg total) by mouth 2 (two) times daily.   DARZALEX FASPRO 1800-30000 MG-UT/15ML SOLN Inject 15 ml over 3-5 minutes subcutaneously once every 2 weeks.   diclofenac Sodium (VOLTAREN) 1 % GEL Apply 2 g topically 4 (four) times daily.   docusate sodium (COLACE) 100 MG capsule Take 100 mg by mouth daily.    Dulaglutide 1.5 MG/0.5ML SOPN Inject 1.5 mg into the skin every 7 (seven) days.   escitalopram (LEXAPRO) 10 MG tablet TAKE (1) TABLET BY MOUTH ONCE DAILY. MAY START WITH 1/2 TABLET FOR 7 DAYS. (Patient taking differently: Take 10 mg by mouth daily.)   fluticasone (FLONASE) 50 MCG/ACT nasal spray INSTILL 2 SPRAYS INTO BOTH NOSTRILS DAILY   Insulin Lispro w/ Trans Port 100 UNIT/ML SOPN Inject 20 Units into the skin See admin instructions. Inject before meals per sliding scale Max TDD 20 units   KYPROLIS 30 MG SOLR Inject into the vein.   Lactulose 20 GM/30ML SOLN Take 30 mLs (20 g total) by mouth daily as needed.   lidocaine-prilocaine (EMLA) cream Apply 1 Application topically as needed (Apply to port prior to treatment and flushes).   LORazepam (ATIVAN) 0.5 MG tablet Take 1 tablet (0.5 mg total) by mouth every 8 (eight)  hours.   losartan (COZAAR) 100 MG tablet Take 100 mg by mouth daily.   magnesium oxide (MAG-OX) 400 (241.3 Mg) MG tablet Take 1 tablet (400 mg total) by mouth 3 (three) times daily.   metFORMIN (GLUCOPHAGE) 500 MG tablet Take 1 tablet (500 mg total) by mouth 2 (two) times daily.   nitrofurantoin, macrocrystal-monohydrate, (MACROBID) 100 MG capsule TAKE (1) CAPSULE BY MOUTH AT BEDTIME. (Patient taking differently: Take 100 mg by mouth at bedtime.)   NOVOLOG FLEXPEN 100 UNIT/ML FlexPen Inject into the skin.   oxyCODONE-acetaminophen (PERCOCET/ROXICET) 5-325 MG tablet Take 1 tablet by mouth 2 (two) times daily as needed for severe pain.   pantoprazole (PROTONIX) 40 MG tablet TAKE (1) TABLET BY MOUTH ONCE DAILY. (Patient taking differently: Take 40 mg by mouth daily.)   polyethylene glycol (MIRALAX / GLYCOLAX) 17 g packet Take 17 g by mouth  daily as needed for mild constipation.   predniSONE (DELTASONE) 20 MG tablet Take 1 tablet (20 mg total) by mouth daily with breakfast for 5 days.   pregabalin (LYRICA) 200 MG capsule TAKE (1) CAPSULE BY MOUTH TWICE DAILY. (Patient taking differently: Take 200 mg by mouth 2 (two) times daily.)   Propylene Glycol (SYSTANE BALANCE) 0.6 % SOLN Apply 1 drop to eye daily as needed (dry eye).    rosuvastatin (CRESTOR) 5 MG tablet Take 1 tablet (5 mg total) by mouth daily.   scopolamine (TRANSDERM-SCOP) 1 MG/3DAYS Place 1 patch (1.5 mg total) onto the skin every 3 (three) days.   valACYclovir (VALTREX) 1000 MG tablet Take 1 tablet (1,000 mg total) by mouth See admin instructions. TAKE 2 TABLETS BY MOUTH NOW; THEN 2 tab about 12 HOURS LATER--- for outbreaks   [DISCONTINUED] nirmatrelvir/ritonavir EUA (PAXLOVID) 20 x 150 MG & 10 x 100MG TABS Take 3 tablets by mouth 2 (two) times daily for 5 days. Complete packet given to you at the hospital     Allergies:   Patient has no known allergies.   ROS:   Please see the history of present illness.     All other systems reviewed  and are negative.   Labs/Other Tests and Data Reviewed:    Recent Labs: 01/30/2022: TSH 5.380 04/15/2022: Magnesium 2.1 04/16/2022: ALT 22; BUN 30; Creatinine, Ser 0.98; Hemoglobin 7.5; Platelets 77; Potassium 4.4; Sodium 139   Recent Lipid Panel Lab Results  Component Value Date/Time   CHOL 105 01/30/2022 09:00 AM   TRIG 160 (H) 01/30/2022 09:00 AM   HDL 35 (L) 01/30/2022 09:00 AM   CHOLHDL 3.0 01/30/2022 09:00 AM   CHOLHDL 2.5 12/28/2019 02:41 AM   LDLCALC 43 01/30/2022 09:00 AM    Wt Readings from Last 3 Encounters:  04/13/22 255 lb 11.7 oz (116 kg)  04/12/22 250 lb 3.2 oz (113.5 kg)  04/05/22 252 lb 3.2 oz (114.4 kg)     Objective:    Vital Signs:  There were no vitals taken for this visit.   VITAL SIGNS:  reviewed GEN:  no acute distress RESPIRATORY:  normal respiratory effort, symmetric expansion.  She is able to speak in full sentences. NEURO:  alert and oriented x 3, no obvious focal deficit  ASSESSMENT & PLAN:    Hospital discharge follow-up Hospital chart reviewed including discharge summary Last CBC showed improvement in Hb to 7.5 No signs of active bleeding Planned to get repeat CBC at cancer center in the next week  Acute on chronic anemia Had drop in Hb to 5.6 Was referred to hospital from cancer center Received 2 units of PRBC in the hospital and 1 unit of PRBC after being discharged Recheck CBC  Acute bronchitis Likely due to COVID-19 infection Completed Paxlovid Currently on prednisone Respiratory symptoms improved now  Recurrent nephrolithiasis She has right-sided lower quadrant abdominal pain/flank pain Since she has history of recurrent nephrolithiasis, will check CT abdomen pelvis to rule out nephrolithiasis or other etiology for acute onset abdominal pain Followed by urology at [facility] to maintain adequate fluid intake    Time:   Today, I have spent 25 minutes reviewing the chart, including problem list, medications, and  with the patient with telehealth technology discussing the above problems.   Medication Adjustments/Labs and Tests Ordered: Current medicines are reviewed at length with the patient today.  Concerns regarding medicines are outlined above.   Tests Ordered: Orders Placed This Encounter  Procedures   CT  Abdomen Pelvis Wo Contrast    Medication Changes: No orders of the defined types were placed in this encounter.    Note: This dictation was prepared with Dragon dictation along with smaller phrase technology. Similar sounding words can be transcribed inadequately or may not be corrected upon review. Any transcriptional errors that result from this process are unintentional.      Disposition:  Follow up  Signed, Lindell Spar, MD  04/19/2022 12:07 PM     [place]

## 2022-04-19 NOTE — Assessment & Plan Note (Signed)
Likely due to COVID-19 infection Completed Paxlovid Currently on prednisone Respiratory symptoms improved now

## 2022-04-19 NOTE — Assessment & Plan Note (Signed)
She has right-sided lower quadrant abdominal pain/flank pain Since she has history of recurrent nephrolithiasis, will check CT abdomen pelvis to rule out nephrolithiasis or other etiology for acute onset abdominal pain Followed by urology at [place] to maintain adequate fluid intake

## 2022-04-19 NOTE — Assessment & Plan Note (Signed)
Had drop in Hb to 5.6 Was referred to hospital from cancer center Received 2 units of PRBC in the hospital and 1 unit of PRBC after being discharged Recheck CBC

## 2022-04-19 NOTE — Patient Instructions (Signed)
Please continue taking medications as prescribed.  Please maintain at least 64 ounces of fluid in a day.  We have ordered CT abdomen for evaluation of lower abdominal pain.

## 2022-04-19 NOTE — Assessment & Plan Note (Signed)
Hospital chart reviewed including discharge summary Last CBC showed improvement in Hb to 7.5 No signs of active bleeding Planned to get repeat CBC at cancer center in the next week

## 2022-04-20 ENCOUNTER — Ambulatory Visit: Payer: BC Managed Care – PPO

## 2022-04-20 ENCOUNTER — Other Ambulatory Visit: Payer: BC Managed Care – PPO

## 2022-04-23 ENCOUNTER — Telehealth: Payer: Self-pay | Admitting: Internal Medicine

## 2022-04-23 ENCOUNTER — Other Ambulatory Visit: Payer: Self-pay

## 2022-04-23 ENCOUNTER — Telehealth: Payer: Self-pay | Admitting: Radiology

## 2022-04-23 NOTE — Telephone Encounter (Signed)
She has covid was in hospital will have test done on Thursday to see if she is still contagious / she has finished the [employer] she had three transfusions while in the hospital. She complains of severe pain not eating or sleeping, cant go for therapy until she knows the covid is not contagious   I told her some of the pain may be covid related and to rest / hydrate as she has already been advised  I told her I will send message to Dr Luna Glasgow to review and advise on what if anything she can do differently

## 2022-04-23 NOTE — Telephone Encounter (Signed)
Daughter Larene Beach called, asked that assistant call patient.  She is in a lot of pain, unable to get to PT, cannot eat/sleep.  Would one of you please call patient and discuss?  I see a primary care note in chart from today as well.  Please review.  Thanks.

## 2022-04-23 NOTE — Telephone Encounter (Signed)
Spoke with patient CT set up for [facility] 10-23 545pm She is still having a lot of pain and not sure that it is manageable per pcp advised to go back to ER if pain is persistent and not manageable with verbal understanding

## 2022-04-23 NOTE — Telephone Encounter (Signed)
Daughter called in on patient behalf.  Patient still has not heard anything back from Kidney referral. Would like a call back to follow up.  Also patient is in a lot of pain, still from 10/12 TOC and also due to kidneys.  Wants a call back in regard.

## 2022-04-24 ENCOUNTER — Other Ambulatory Visit: Payer: Self-pay | Admitting: Internal Medicine

## 2022-04-24 MED ORDER — TRAMADOL HCL 50 MG PO TABS: 50.0000 mg | ORAL_TABLET | Freq: Three times a day (TID) | ORAL | 0 refills | Status: AC | PRN

## 2022-04-24 NOTE — Telephone Encounter (Signed)
I discussed with Dr Luna Glasgow and called her daughter, she is frustrated because she feels like no one is helping I explained Dr Luna Glasgow wants to make sure she follows with the oncologist and the primary care, and she has been. I advised per Dr Luna Glasgow the pain may be related to the virus (covid) that she had. She expressed frustration she is not getting better and no one is helping. I explained again Dr Luna Glasgow feels like the cancer center should be advised of the shoulder pain, may be also from the myeloma. She voiced understanding but is very frustrated.

## 2022-04-26 ENCOUNTER — Inpatient Hospital Stay: Payer: BC Managed Care – PPO

## 2022-04-26 ENCOUNTER — Ambulatory Visit: Payer: BC Managed Care – PPO

## 2022-04-26 ENCOUNTER — Other Ambulatory Visit: Payer: BC Managed Care – PPO

## 2022-04-26 DIAGNOSIS — D649 Anemia, unspecified: Secondary | ICD-10-CM | POA: Diagnosis not present

## 2022-04-26 DIAGNOSIS — C9000 Multiple myeloma not having achieved remission: Secondary | ICD-10-CM

## 2022-04-26 LAB — COMPREHENSIVE METABOLIC PANEL
ALT: 28 U/L (ref 0–44)
AST: 18 U/L (ref 15–41)
Albumin: 2.6 g/dL — ABNORMAL LOW (ref 3.5–5.0)
Alkaline Phosphatase: 127 U/L — ABNORMAL HIGH (ref 38–126)
Anion gap: 11 (ref 5–15)
BUN: 21 mg/dL — ABNORMAL HIGH (ref 6–20)
CO2: 25 mmol/L (ref 22–32)
Calcium: 8.7 mg/dL — ABNORMAL LOW (ref 8.9–10.3)
Chloride: 97 mmol/L — ABNORMAL LOW (ref 98–111)
Creatinine, Ser: 0.97 mg/dL (ref 0.44–1.00)
GFR, Estimated: >60 mL/min (ref >60)
Glucose, Bld: 143 mg/dL — ABNORMAL HIGH (ref 70–99)
Potassium: 3.8 mmol/L (ref 3.5–5.1)
Sodium: 133 mmol/L — ABNORMAL LOW (ref 135–145)
Total Bilirubin: 1.1 mg/dL (ref 0.3–1.2)
Total Protein: 6.3 g/dL — ABNORMAL LOW (ref 6.5–8.1)

## 2022-04-26 LAB — CBC WITH DIFFERENTIAL/PLATELET
Abs Immature Granulocytes: 0.03 10*3/uL (ref 0.00–0.07)
Basophils Absolute: 0 10*3/uL (ref 0.0–0.1)
Basophils Relative: 0 %
Eosinophils Absolute: 0 10*3/uL (ref 0.0–0.5)
Eosinophils Relative: 1 %
HCT: 25.6 % — ABNORMAL LOW (ref 36.0–46.0)
Hemoglobin: 8.1 g/dL — ABNORMAL LOW (ref 12.0–15.0)
Immature Granulocytes: 1 %
Lymphocytes Relative: 16 %
Lymphs Abs: 1 10*3/uL (ref 0.7–4.0)
MCH: 30.1 pg (ref 26.0–34.0)
MCHC: 31.6 g/dL (ref 30.0–36.0)
MCV: 95.2 fL (ref 80.0–100.0)
Monocytes Absolute: 0.9 10*3/uL (ref 0.1–1.0)
Monocytes Relative: 16 %
Neutro Abs: 4 10*3/uL (ref 1.7–7.7)
Neutrophils Relative %: 66 %
Platelets: 101 10*3/uL — ABNORMAL LOW (ref 150–400)
RBC: 2.69 MIL/uL — ABNORMAL LOW (ref 3.87–5.11)
RDW: 18.5 % — ABNORMAL HIGH (ref 11.5–15.5)
WBC: 6 10*3/uL (ref 4.0–10.5)
nRBC: 0.3 % — ABNORMAL HIGH (ref 0.0–0.2)

## 2022-04-26 NOTE — Progress Notes (Signed)
Patient was notified of her lab results from today and that she does not need any blood products.

## 2022-04-26 NOTE — Progress Notes (Signed)
Joanna Reid presents to have home infusion pump d/c'd and for port-a-cath deaccess with flush.  Portacath located right chest wall accessed with  H 20 needle.  Good blood return present. Portacath flushed with NS and 500U/83m Heparin, and needle removed intact.  Procedure tolerated well and without incident.  Vitals stable and discharged home from clinic via wheelchair. Follow up as scheduled.

## 2022-04-26 NOTE — Patient Instructions (Signed)
[facility]  Discharge Instructions: Thank you for choosing [facility] to provide your oncology and hematology care.  If you have a lab appointment with the [facility], please come in thru the Main Entrance and check in at the main information desk.  Wear comfortable clothing and clothing appropriate for easy access to any Portacath or PICC line.   We strive to give you quality time with your provider. You may need to reschedule your appointment if you arrive late (15 or more minutes).  Arriving late affects you and other patients whose appointments are after yours.  Also, if you miss three or more appointments without notifying the office, you may be dismissed from the clinic at the provider's discretion.      For prescription refill requests, have your pharmacy contact our office and allow 72 hours for refills to be completed.    Today you received prot flush and labs.   To help prevent nausea and vomiting after your treatment, we encourage you to take your nausea medication as directed.  BELOW ARE SYMPTOMS THAT SHOULD BE REPORTED IMMEDIATELY: *FEVER GREATER THAN 100.4 F (38 C) OR HIGHER *CHILLS OR SWEATING *NAUSEA AND VOMITING THAT IS NOT CONTROLLED WITH YOUR NAUSEA MEDICATION *UNUSUAL SHORTNESS OF BREATH *UNUSUAL BRUISING OR BLEEDING *URINARY PROBLEMS (pain or burning when urinating, or frequent urination) *BOWEL PROBLEMS (unusual diarrhea, constipation, pain near the anus) TENDERNESS IN MOUTH AND THROAT WITH OR WITHOUT PRESENCE OF ULCERS (sore throat, sores in mouth, or a toothache) UNUSUAL RASH, SWELLING OR PAIN  UNUSUAL VAGINAL DISCHARGE OR ITCHING   Items with * indicate a potential emergency and should be followed up as soon as possible or go to the Emergency Department if any problems should occur.  Please show the CHEMOTHERAPY ALERT CARD or IMMUNOTHERAPY ALERT CARD at check-in to the Emergency Department and triage nurse.  Should you  have questions after your visit or need to cancel or reschedule your appointment, please contact [facility] [phone]  and follow the prompts.  Office hours are 8:00 a.m. to 4:30 p.m. Monday - Friday. Please note that voicemails left after 4:00 p.m. may not be returned until the following business day.  We are closed weekends and major holidays. You have access to a nurse at all times for urgent questions. Please call the main number to the clinic [phone] and follow the prompts.  For any non-urgent questions, you may also contact your provider using MyChart. We now offer e-Visits for anyone 67 and older to request care online for non-urgent symptoms. For details visit [email or web address].   Also download the MyChart app! Go to the app store, search "MyChart", open the app, select [facility], and log in with your MyChart username and password.  Masks are optional in the cancer centers. If you would like for your care team to wear a mask while they are taking care of you, please let them know. You may have one support person who is at least 57 years old accompany you for your appointments.

## 2022-04-27 LAB — KAPPA/LAMBDA LIGHT CHAINS
Kappa free light chain: 7.4 mg/L (ref 3.3–19.4)
Kappa, lambda light chain ratio: 0.01 — ABNORMAL LOW (ref 0.26–1.65)
Lambda free light chains: 1266.5 mg/L — ABNORMAL HIGH (ref 5.7–26.3)

## 2022-04-30 ENCOUNTER — Ambulatory Visit (HOSPITAL_BASED_OUTPATIENT_CLINIC_OR_DEPARTMENT_OTHER)
Admission: RE | Admit: 2022-04-30 | Discharge: 2022-04-30 | Disposition: A | Discharge status: Home / Self Care | Payer: BC Managed Care – PPO | Source: Ambulatory Visit | Attending: Internal Medicine | Admitting: Internal Medicine

## 2022-04-30 DIAGNOSIS — N200 Calculus of kidney: Secondary | ICD-10-CM | POA: Diagnosis present

## 2022-04-30 DIAGNOSIS — R1031 Right lower quadrant pain: Secondary | ICD-10-CM | POA: Insufficient documentation

## 2022-05-01 LAB — PROTEIN ELECTROPHORESIS, SERUM
A/G Ratio: 1 (ref 0.7–1.7)
Albumin ELP: 2.6 g/dL — ABNORMAL LOW (ref 2.9–4.4)
Alpha-1-Globulin: 0.4 g/dL (ref 0.0–0.4)
Alpha-2-Globulin: 1 g/dL (ref 0.4–1.0)
Beta Globulin: 0.7 g/dL (ref 0.7–1.3)
Gamma Globulin: 0.6 g/dL (ref 0.4–1.8)
Globulin, Total: 2.7 g/dL (ref 2.2–3.9)
M-Spike, %: 0.3 g/dL — ABNORMAL HIGH
Total Protein ELP: 5.3 g/dL — ABNORMAL LOW (ref 6.0–8.5)

## 2022-05-02 ENCOUNTER — Other Ambulatory Visit: Payer: Self-pay | Admitting: *Deleted

## 2022-05-02 ENCOUNTER — Telehealth: Payer: Self-pay

## 2022-05-02 ENCOUNTER — Inpatient Hospital Stay: Payer: BC Managed Care – PPO

## 2022-05-02 DIAGNOSIS — R8290 Unspecified abnormal findings in urine: Secondary | ICD-10-CM

## 2022-05-02 DIAGNOSIS — C9000 Multiple myeloma not having achieved remission: Secondary | ICD-10-CM

## 2022-05-02 DIAGNOSIS — D649 Anemia, unspecified: Secondary | ICD-10-CM | POA: Diagnosis not present

## 2022-05-02 LAB — CBC WITH DIFFERENTIAL/PLATELET
Abs Immature Granulocytes: 0.04 10*3/uL (ref 0.00–0.07)
Basophils Absolute: 0 10*3/uL (ref 0.0–0.1)
Basophils Relative: 0 %
Eosinophils Absolute: 0 10*3/uL (ref 0.0–0.5)
Eosinophils Relative: 1 %
HCT: 22.4 % — ABNORMAL LOW (ref 36.0–46.0)
Hemoglobin: 7.1 g/dL — ABNORMAL LOW (ref 12.0–15.0)
Immature Granulocytes: 1 %
Lymphocytes Relative: 19 %
Lymphs Abs: 1 10*3/uL (ref 0.7–4.0)
MCH: 30.6 pg (ref 26.0–34.0)
MCHC: 31.7 g/dL (ref 30.0–36.0)
MCV: 96.6 fL (ref 80.0–100.0)
Monocytes Absolute: 0.8 10*3/uL (ref 0.1–1.0)
Monocytes Relative: 16 %
Neutro Abs: 3.3 10*3/uL (ref 1.7–7.7)
Neutrophils Relative %: 63 %
Platelets: 138 10*3/uL — ABNORMAL LOW (ref 150–400)
RBC: 2.32 MIL/uL — ABNORMAL LOW (ref 3.87–5.11)
RDW: 18.5 % — ABNORMAL HIGH (ref 11.5–15.5)
WBC: 5.3 10*3/uL (ref 4.0–10.5)
nRBC: 0 % (ref 0.0–0.2)

## 2022-05-02 LAB — BPAM RBC
Blood Product Expiration Date: 202311122359
Unit Type and Rh: 6200

## 2022-05-02 LAB — COMPREHENSIVE METABOLIC PANEL
ALT: 14 U/L (ref 0–44)
AST: 19 U/L (ref 15–41)
Albumin: 2.6 g/dL — ABNORMAL LOW (ref 3.5–5.0)
Alkaline Phosphatase: 120 U/L (ref 38–126)
Anion gap: 12 (ref 5–15)
BUN: 24 mg/dL — ABNORMAL HIGH (ref 6–20)
CO2: 24 mmol/L (ref 22–32)
Calcium: 8.4 mg/dL — ABNORMAL LOW (ref 8.9–10.3)
Chloride: 99 mmol/L (ref 98–111)
Creatinine, Ser: 1.13 mg/dL — ABNORMAL HIGH (ref 0.44–1.00)
GFR, Estimated: 57 mL/min — ABNORMAL LOW (ref >60)
Glucose, Bld: 253 mg/dL — ABNORMAL HIGH (ref 70–99)
Potassium: 3.8 mmol/L (ref 3.5–5.1)
Sodium: 135 mmol/L (ref 135–145)
Total Bilirubin: 1 mg/dL (ref 0.3–1.2)
Total Protein: 6.3 g/dL — ABNORMAL LOW (ref 6.5–8.1)

## 2022-05-02 LAB — MAGNESIUM: Magnesium: 1.3 mg/dL — ABNORMAL LOW (ref 1.7–2.4)

## 2022-05-02 LAB — LACTATE DEHYDROGENASE: LDH: 192 U/L (ref 98–192)

## 2022-05-02 LAB — SAMPLE TO BLOOD BANK

## 2022-05-02 LAB — PREPARE RBC (CROSSMATCH)

## 2022-05-02 MED ORDER — SODIUM CHLORIDE 0.9% FLUSH
10.0000 mL | INTRAVENOUS | Status: DC | PRN
  Administered 2022-05-02: 10 mL

## 2022-05-02 MED ORDER — HEPARIN SOD (PORK) LOCK FLUSH 100 UNIT/ML IV SOLN
500.0000 [IU] | Freq: Once | INTRAVENOUS | Status: AC
  Administered 2022-05-02: 500 [IU] via INTRAVENOUS

## 2022-05-02 NOTE — Progress Notes (Signed)
Hemoglobin 7.1 today. Notified MD, will order one unit of blood to be transfused per orders for tomorrow.

## 2022-05-02 NOTE — Progress Notes (Signed)
Pt presents today for port flush and labs per provider's order. Port flushed easily with good blood return with 36m and 5 mL of heparin. Good blood return noted and  no bruising or swelling noted at the site. Pt returns tomorrow for office and treatment.  Discharged from clinic via wheelchair in stable condition. Alert and oriented x 3. [facility].

## 2022-05-02 NOTE — Telephone Encounter (Signed)
error 

## 2022-05-03 ENCOUNTER — Other Ambulatory Visit: Payer: Self-pay | Admitting: Internal Medicine

## 2022-05-03 ENCOUNTER — Inpatient Hospital Stay (HOSPITAL_BASED_OUTPATIENT_CLINIC_OR_DEPARTMENT_OTHER): Payer: BC Managed Care – PPO | Admitting: Hematology

## 2022-05-03 ENCOUNTER — Encounter: Payer: Self-pay | Admitting: Hematology

## 2022-05-03 ENCOUNTER — Inpatient Hospital Stay: Payer: BC Managed Care – PPO

## 2022-05-03 ENCOUNTER — Other Ambulatory Visit: Payer: BC Managed Care – PPO

## 2022-05-03 ENCOUNTER — Other Ambulatory Visit (HOSPITAL_COMMUNITY): Payer: Self-pay | Admitting: Physician Assistant

## 2022-05-03 DIAGNOSIS — C9000 Multiple myeloma not having achieved remission: Secondary | ICD-10-CM | POA: Diagnosis not present

## 2022-05-03 DIAGNOSIS — F411 Generalized anxiety disorder: Secondary | ICD-10-CM

## 2022-05-03 DIAGNOSIS — E876 Hypokalemia: Secondary | ICD-10-CM

## 2022-05-03 DIAGNOSIS — D649 Anemia, unspecified: Secondary | ICD-10-CM | POA: Diagnosis not present

## 2022-05-03 LAB — URINALYSIS, ROUTINE W REFLEX MICROSCOPIC
Bilirubin, UA: NEGATIVE
Glucose, UA: NEGATIVE
Ketones, UA: NEGATIVE
Nitrite, UA: NEGATIVE
Protein,UA: NEGATIVE
RBC, UA: NEGATIVE
Specific Gravity, UA: 1.006 (ref 1.005–1.030)
Urobilinogen, Ur: 0.2 mg/dL (ref 0.2–1.0)
pH, UA: 6 (ref 5.0–7.5)

## 2022-05-03 LAB — MICROSCOPIC EXAMINATION
Casts: NONE SEEN /lpf
Epithelial Cells (non renal): >10 /hpf — AB (ref 0–10)
RBC, Urine: NONE SEEN /hpf (ref 0–2)

## 2022-05-03 MED ORDER — MAGNESIUM SULFATE 2 GM/50ML IV SOLN
2.0000 g | INTRAVENOUS | Status: AC
  Administered 2022-05-03 (×2): 2 g via INTRAVENOUS
  Filled 2022-05-03 (×2): qty 50

## 2022-05-03 MED ORDER — ACETAMINOPHEN 325 MG PO TABS
650.0000 mg | ORAL_TABLET | Freq: Once | ORAL | Status: AC
  Administered 2022-05-03: 650 mg via ORAL
  Filled 2022-05-03: qty 2

## 2022-05-03 MED ORDER — METOCLOPRAMIDE HCL 10 MG PO TABS: 10.0000 mg | ORAL_TABLET | Freq: Three times a day (TID) | ORAL | 3 refills | Status: AC | PRN

## 2022-05-03 MED ORDER — HEPARIN SOD (PORK) LOCK FLUSH 100 UNIT/ML IV SOLN
500.0000 [IU] | Freq: Every day | INTRAVENOUS | Status: AC | PRN
  Administered 2022-05-03: 500 [IU]

## 2022-05-03 MED ORDER — SODIUM CHLORIDE 0.9% FLUSH
10.0000 mL | INTRAVENOUS | Status: AC | PRN
  Administered 2022-05-03: 10 mL

## 2022-05-03 MED ORDER — DIPHENHYDRAMINE HCL 25 MG PO CAPS
25.0000 mg | ORAL_CAPSULE | Freq: Once | ORAL | Status: AC
  Administered 2022-05-03: 25 mg via ORAL
  Filled 2022-05-03: qty 1

## 2022-05-03 MED ORDER — SODIUM CHLORIDE 0.9% IV SOLUTION
250.0000 mL | Freq: Once | INTRAVENOUS | Status: AC
  Administered 2022-05-03: 250 mL via INTRAVENOUS

## 2022-05-03 MED ORDER — POTASSIUM CHLORIDE IN NACL 20-0.9 MEQ/L-% IV SOLN
Freq: Once | INTRAVENOUS | Status: AC
  Filled 2022-05-03: qty 1000

## 2022-05-03 MED ORDER — POTASSIUM CHLORIDE IN NACL 40-0.9 MEQ/L-% IV SOLN: Freq: Once | INTRAVENOUS | Status: DC

## 2022-05-03 NOTE — Progress Notes (Signed)
Patient presents today for chemotherapy infusion.  Patient is in satisfactory condition with no new complaints voiced.  Vital signs are stable.  Labs reviewed by Dr. Delton Coombes during her office visit.  We will hold treatment today per Dr. Delton Coombes.  Magnesium today is 1.3 and hemoglobin is 7.1. We will give House IVF with an additional Magnesium 2 grams IV today and one unit of PRBC.    Patient tolerated IVF and blood transfusion well with no complaints voiced.  Patient left via wheelchair in stable condition.  Vital signs stable at discharge.  Follow up as scheduled.

## 2022-05-03 NOTE — Progress Notes (Signed)
Hold tx today per MD. Pt to receive IVF w/electrolytes, 1 unit of blood, and magnesium 4 g IV. Primary RN and pharmacy notified.

## 2022-05-03 NOTE — Patient Instructions (Signed)
[facility]  Discharge Instructions: Thank you for choosing [facility] to provide your oncology and hematology care.  If you have a lab appointment with the Cancer Center, please come in thru the Main Entrance and check in at the main information desk.  Wear comfortable clothing and clothing appropriate for easy access to any Portacath or PICC line.   We strive to give you quality time with your provider. You may need to reschedule your appointment if you arrive late (15 or more minutes).  Arriving late affects you and other patients whose appointments are after yours.  Also, if you miss three or more appointments without notifying the office, you may be dismissed from the clinic at the provider's discretion.      For prescription refill requests, have your pharmacy contact our office and allow 72 hours for refills to be completed.     To help prevent nausea and vomiting after your treatment, we encourage you to take your nausea medication as directed.  BELOW ARE SYMPTOMS THAT SHOULD BE REPORTED IMMEDIATELY: *FEVER GREATER THAN 100.4 F (38 C) OR HIGHER *CHILLS OR SWEATING *NAUSEA AND VOMITING THAT IS NOT CONTROLLED WITH YOUR NAUSEA MEDICATION *UNUSUAL SHORTNESS OF BREATH *UNUSUAL BRUISING OR BLEEDING *URINARY PROBLEMS (pain or burning when urinating, or frequent urination) *BOWEL PROBLEMS (unusual diarrhea, constipation, pain near the anus) TENDERNESS IN MOUTH AND THROAT WITH OR WITHOUT PRESENCE OF ULCERS (sore throat, sores in mouth, or a toothache) UNUSUAL RASH, SWELLING OR PAIN  UNUSUAL VAGINAL DISCHARGE OR ITCHING   Items with * indicate a potential emergency and should be followed up as soon as possible or go to the Emergency Department if any problems should occur.  Please show the CHEMOTHERAPY ALERT CARD or IMMUNOTHERAPY ALERT CARD at check-in to the Emergency Department and triage nurse.  Should you have questions after your visit or need to  cancel or reschedule your appointment, please contact [facility] AT  [phone]  and follow the prompts.  Office hours are 8:00 a.m. to 4:30 p.m. Monday - Friday. Please note that voicemails left after 4:00 p.m. may not be returned until the following business day.  We are closed weekends and major holidays. You have access to a nurse at all times for urgent questions. Please call the main number to the clinic [phone] and follow the prompts.  For any non-urgent questions, you may also contact your provider using MyChart. We now offer e-Visits for anyone 18 and older to request care online for non-urgent symptoms. For details visit [email or web address].   Also download the MyChart app! Go to the app store, search "MyChart", open the app, select [facility], and log in with your MyChart username and password.  Masks are optional in the cancer centers. If you would like for your care team to wear a mask while they are taking care of you, please let them know. You may have one support person who is at least 57 years old accompany you for your appointments.  

## 2022-05-03 NOTE — Progress Notes (Signed)
Lab called and needed permission from Dr. Delton Coombes to give the least incompatible unit of blood today. MD notified and ok to give per MD.

## 2022-05-03 NOTE — Patient Instructions (Addendum)
[facility] at [facility] Discharge Instructions   You were seen and examined today by Dr. Delton Coombes.  He reviewed the results of your lab work. Your hemoglobin is 7.1. We will plan to transfuse a unit of blood today. We will also give you IV fluids and magnesium.  Stop taking amlodipine and losartan.   We will get an MRI of your brain to see what is causing your nausea.   We will send a prescription for Reglan to see if this helps with your nausea.   Return as scheduled.    Thank you for choosing [facility] at [facility] to provide your oncology and hematology care.  To afford each patient quality time with our provider, please arrive at least 15 minutes before your scheduled appointment time.   If you have a lab appointment with the [facility] please come in thru the Main Entrance and check in at the main information desk.  You need to re-schedule your appointment should you arrive 10 or more minutes late.  We strive to give you quality time with our providers, and arriving late affects you and other patients whose appointments are after yours.  Also, if you no show three or more times for appointments you may be dismissed from the clinic at the providers discretion.     Again, thank you for choosing [facility].  Our hope is that these requests will decrease the amount of time that you wait before being seen by our physicians.       _____________________________________________________________  Should you have questions after your visit to [facility], please contact our office at [phone] and follow the prompts.  Our office hours are 8:00 a.m. and 4:30 p.m. Monday - Friday.  Please note that voicemails left after 4:00 p.m. may not be returned until the following business day.  We are closed weekends and major holidays.  You do have access to a nurse 24-7, just call the main number to the clinic  [phone] and do not press any options, hold on the line and a nurse will answer the phone.    For prescription refill requests, have your pharmacy contact our office and allow 72 hours.    Due to Covid, you will need to wear a mask upon entering the hospital. If you do not have a mask, a mask will be given to you at the Main Entrance upon arrival. For doctor visits, patients may have 1 support person age 15 or older with them. For treatment visits, patients can not have anyone with them due to social distancing guidelines and our immunocompromised population.

## 2022-05-03 NOTE — Progress Notes (Addendum)
[name], [place] [address]   CLINIC:  Medical Oncology/Hematology  PCP:  Lindell Spar, MD [address] / [place] [address] [phone]   REASON FOR VISIT:  Follow-up for multiple myeloma  PRIOR THERAPY:  1. RVD x 4 cycles from 11/01/2017 through 01/14/2018. 2. Stem cell transplant on 02/20/2018. 3. Velcade from 06/11/2018 to 12/22/2019.  NGS Results: not done  CURRENT THERAPY: Daratumumab, carfilzomib and dexamethasone  BRIEF ONCOLOGIC HISTORY:  Oncology History  Multiple myeloma ([place])  10/29/2017 Initial Diagnosis   Multiple myeloma ([place])   11/01/2017 - 01/24/2018 Chemotherapy   The patient had dexamethasone (DECADRON) 4 MG tablet, 1 of 1 cycle, Start date: --, End date: -- lenalidomide (REVLIMID) 25 MG capsule, 1 of 1 cycle, Start date: --, End date: -- bortezomib SQ (VELCADE) chemo injection 3 mg, 1.3 mg/m2 = 3 mg, Subcutaneous,  Once, 5 of 5 cycles Administration: 3 mg (11/01/2017), 3 mg (11/08/2017), 3 mg (11/05/2017), 3 mg (11/22/2017), 3 mg (11/12/2017), 3 mg (11/29/2017), 3 mg (11/26/2017), 3 mg (12/03/2017), 3 mg (12/13/2017), 3 mg (12/20/2017), 3 mg (12/17/2017), 3 mg (12/24/2017), 3 mg (01/03/2018), 3 mg (01/10/2018), 3 mg (01/07/2018), 3 mg (01/14/2018), 3 mg (01/24/2018)  for chemotherapy treatment.    06/11/2018 - 12/20/2021 Chemotherapy   Patient is on Treatment Plan : MYELOMA MAINTENANCE Bortezomib SQ q 7d x 6 weeks, two weeks off then q 14d     01/25/2022 - 02/08/2022 Chemotherapy   Patient is on Treatment Plan : MYELOMA RELAPSED/REFRACTORY Carfilzomib (20/70) + Daratumumab SQ + Dexamethasone (20/40) DaraKd q28d     01/25/2022 -  Chemotherapy   Patient is on Treatment Plan : MYELOMA RELAPSED/REFRACTORY Carfilzomib (20/70) + Daratumumab SQ + Dexamethasone (20/40) DaraKd q28d       CANCER STAGING:  Cancer Staging  Multiple myeloma ([place]) Staging form: Plasma Cell Myeloma and Plasma Cell Disorders, AJCC 8th Edition - Clinical: No  stage assigned - Unsigned - Clinical: No stage assigned - Unsigned   INTERVAL HISTORY:  [name], a 56 y.o. female, seen for follow-up of multiple myeloma.  Today she does not feel well.  Energy levels are low.  Systolic blood pressure is 83/50.  She reports dyspnea and rarely chest pain on exertion.  Also has significant nausea since Saturday.  She is taking Ativan once a day and using scopolamine patch.  She vomited over the weekend.   REVIEW OF SYSTEMS:  Review of Systems  Constitutional:  Negative for appetite change and fatigue.  Respiratory:  Positive for shortness of breath.   Gastrointestinal:  Positive for nausea and vomiting. Negative for diarrhea.  Neurological:  Positive for dizziness and numbness.  All other systems reviewed and are negative.   PAST MEDICAL/SURGICAL HISTORY:  Past Medical History:  Diagnosis Date   Acid reflux    Allergic rhinitis    Cancer ([place])    multiple myeloma   Diabetes mellitus    type 2   Gout    Gout    H/o COVID-19--- was Positive 09/17/2019, Negative 12/23/19 AND also Neg on  12/26/19 12/26/2019   HBP (high blood pressure)    History of kidney stones    Migraines    Multiple myeloma ([facility]) 10/29/2017   Past Surgical History:  Procedure Laterality Date   BREAST CYST EXCISION Left    2009 no visible scar on skin   CESAREAN SECTION     COLONOSCOPY WITH PROPOFOL N/A 12/25/2019   Procedure: COLONOSCOPY  WITH PROPOFOL;  Surgeon: Rogene Houston, MD;  Location: [facility];  Service: Endoscopy;  Laterality: N/A;  730   EXTRACORPOREAL SHOCK WAVE LITHOTRIPSY Left 10/10/2017   Procedure: LEFT EXTRACORPOREAL SHOCK WAVE LITHOTRIPSY (ESWL);  Surgeon: Bjorn Loser, MD;  Location: [facility];  Service: Urology;  Laterality: Left;   EYE SURGERY     HEMORRHOID SURGERY N/A 11/19/2012   Procedure: HEMORRHOIDECTOMY;  Surgeon: Jamesetta So, MD;  Location: [facility];  Service: General;  Laterality: N/A;   IR IMAGING GUIDED PORT INSERTION   01/18/2022   kidney stones  1998   LAPAROSCOPIC UNILATERAL SALPINGO OOPHERECTOMY  05/14/2012   Procedure: LAPAROSCOPIC UNILATERAL SALPINGO OOPHORECTOMY;  Surgeon: [name], MD;  Location: [facility];  Service: Gynecology;  Laterality: Right;  laparoscopic right salpingo-oophorectomy   PARTIAL HYSTERECTOMY     POLYPECTOMY  12/25/2019   Procedure: POLYPECTOMY;  Surgeon: Rogene Houston, MD;  Location: [facility];  Service: Endoscopy;;   TONSILECTOMY, ADENOIDECTOMY, BILATERAL MYRINGOTOMY AND TUBES     VESICOVAGINAL FISTULA CLOSURE W/ TAH      SOCIAL HISTORY:  Social History   Socioeconomic History   Marital status: Married    Spouse name: [name]   Number of children: 1   Years of education: 12   Highest education level: Some college, no degree  Occupational History    Employer: [employer]  Tobacco Use   Smoking status: Former    Types: Cigarettes    Quit date: 02/27/1999    Years since quitting: 23.1   Smokeless tobacco: Never   Tobacco comments:    socially   Vaping Use   Vaping Use: Never used  Substance and Sexual Activity   Alcohol use: No    Alcohol/week: 0.0 standard drinks of alcohol   Drug use: No   Sexual activity: Yes    Birth control/protection: Surgical    Comment: hyst  Other Topics Concern   Not on file  Social History Narrative   Not on file   Social Determinants of Health   Financial Resource Strain: Low Risk  (08/08/2021)   Overall Financial Resource Strain (CARDIA)    Difficulty of Paying Living Expenses: Not hard at all  Food Insecurity: No Food Insecurity (04/13/2022)   Hunger Vital Sign    Worried About Running Out of Food in the Last Year: Never true    Ran Out of Food in the Last Year: Never true  Transportation Needs: No Transportation Needs (04/13/2022)   PRAPARE - Hydrologist (Medical): No    Lack of Transportation (Non-Medical): No  Physical Activity: Unknown (08/08/2021)   Exercise Vital Sign    Days of Exercise  per Week: Not on file    Minutes of Exercise per Session: 10 min  Stress: No Stress Concern Present (08/08/2021)   [place]    Feeling of Stress : Not at all  Social Connections: [place] (08/08/2021)   Social Connection and Isolation Panel [NHANES]    Frequency of Communication with Friends and Family: More than three times a week    Frequency of Social Gatherings with Friends and Family: Twice a week    Attends Religious Services: More than 4 times per year    Active Member of [employer] or Organizations: Yes    Attends Archivist Meetings: More than 4 times per year    Marital Status: Married  Human resources officer Violence: Not At Risk (04/13/2022)  Humiliation, Afraid, Rape, and Kick questionnaire    Fear of Current or Ex-Partner: No    Emotionally Abused: No    Physically Abused: No    Sexually Abused: No    FAMILY HISTORY:  Family History  Problem Relation Age of Onset   Arthritis Other    Cancer Other    Diabetes Other    Hypertension Mother    Dementia Mother    Diabetes Father    ALS Father    Diabetes Brother    Hypertension Brother    Cancer Paternal Aunt    COPD Maternal Grandmother    Cancer Maternal Grandfather    Anesthesia problems Paternal Grandfather     CURRENT MEDICATIONS:  Current Outpatient Medications  Medication Sig Dispense Refill   acyclovir (ZOVIRAX) 400 MG tablet TAKE (1) TABLET BY MOUTH TWICE DAILY. 60 tablet 6   albuterol (VENTOLIN HFA) 108 (90 Base) MCG/ACT inhaler Inhale 2 puffs into the lungs every 4 (four) hours as needed for wheezing or shortness of breath. 18 g 3   allopurinol (ZYLOPRIM) 300 MG tablet TAKE (1) TABLET BY MOUTH ONCE DAILY. 90 tablet 0   amLODipine (NORVASC) 2.5 MG tablet Take 2.5 mg by mouth daily.     aspirin EC 81 MG tablet Take 1 tablet (81 mg total) by mouth daily with breakfast. For stroke prophylaxis 30 tablet 11   BD PEN NEEDLE NANO U/F  32G X 4 MM MISC Inject into the skin.     bumetanide (BUMEX) 0.5 MG tablet Take 0.5 mg by mouth daily.      carvedilol (COREG) 25 MG tablet Take 1 tablet (25 mg total) by mouth 2 (two) times daily.     DARZALEX FASPRO 1800-30000 MG-UT/15ML SOLN Inject 15 ml over 3-5 minutes subcutaneously once every 2 weeks. 120 mL 0   diclofenac Sodium (VOLTAREN) 1 % GEL Apply 2 g topically 4 (four) times daily. 100 g 0   docusate sodium (COLACE) 100 MG capsule Take 100 mg by mouth daily.      Dulaglutide 1.5 MG/0.5ML SOPN Inject 1.5 mg into the skin every 7 (seven) days.     escitalopram (LEXAPRO) 10 MG tablet TAKE (1) TABLET BY MOUTH ONCE DAILY. MAY START WITH 1/2 TABLET FOR 7 DAYS. (Patient taking differently: Take 10 mg by mouth daily.) 90 tablet 0   fluticasone (FLONASE) 50 MCG/ACT nasal spray INSTILL 2 SPRAYS INTO BOTH NOSTRILS DAILY 16 g 4   Insulin Lispro w/ Trans Port 100 UNIT/ML SOPN Inject 20 Units into the skin See admin instructions. Inject before meals per sliding scale Max TDD 20 units     KYPROLIS 30 MG SOLR Inject into the vein.     Lactulose 20 GM/30ML SOLN Take 30 mLs (20 g total) by mouth daily as needed. 473 mL 2   lidocaine-prilocaine (EMLA) cream Apply 1 Application topically as needed (Apply to port prior to treatment and flushes). 30 g 6   LORazepam (ATIVAN) 0.5 MG tablet Take 1 tablet (0.5 mg total) by mouth every 8 (eight) hours. 30 tablet 0   losartan (COZAAR) 100 MG tablet Take 100 mg by mouth daily.     magnesium oxide (MAG-OX) 400 (241.3 Mg) MG tablet Take 1 tablet (400 mg total) by mouth 3 (three) times daily. 90 tablet 3   metFORMIN (GLUCOPHAGE) 500 MG tablet Take 1 tablet (500 mg total) by mouth 2 (two) times daily. 60 tablet 4   nitrofurantoin, macrocrystal-monohydrate, (MACROBID) 100 MG capsule TAKE (1) CAPSULE BY MOUTH  AT BEDTIME. (Patient taking differently: Take 100 mg by mouth at bedtime.) 90 capsule 0   NOVOLOG FLEXPEN 100 UNIT/ML FlexPen Inject into the skin.      oxyCODONE-acetaminophen (PERCOCET/ROXICET) 5-325 MG tablet Take 1 tablet by mouth 2 (two) times daily as needed for severe pain. 10 tablet 0   pantoprazole (PROTONIX) 40 MG tablet TAKE (1) TABLET BY MOUTH ONCE DAILY. (Patient taking differently: Take 40 mg by mouth daily.) 90 tablet 0   polyethylene glycol (MIRALAX / GLYCOLAX) 17 g packet Take 17 g by mouth daily as needed for mild constipation. 14 each 0   pregabalin (LYRICA) 200 MG capsule TAKE (1) CAPSULE BY MOUTH TWICE DAILY. (Patient taking differently: Take 200 mg by mouth 2 (two) times daily.) 60 capsule 11   Propylene Glycol (SYSTANE BALANCE) 0.6 % SOLN Apply 1 drop to eye daily as needed (dry eye).      rosuvastatin (CRESTOR) 5 MG tablet Take 1 tablet (5 mg total) by mouth daily. 90 tablet 3   scopolamine (TRANSDERM-SCOP) 1 MG/3DAYS Place 1 patch (1.5 mg total) onto the skin every 3 (three) days. 10 patch 12   traMADol (ULTRAM) 50 MG tablet Take 1 tablet (50 mg total) by mouth every 8 (eight) hours as needed for severe pain. 30 tablet 0   valACYclovir (VALTREX) 1000 MG tablet Take 1 tablet (1,000 mg total) by mouth See admin instructions. TAKE 2 TABLETS BY MOUTH NOW; THEN 2 tab about 12 HOURS LATER--- for outbreaks 30 tablet 0   No current facility-administered medications for this visit.    ALLERGIES:  No Known Allergies  PHYSICAL EXAM:  Performance status (ECOG): 1 - Symptomatic but completely ambulatory  There were no vitals filed for this visit. Wt Readings from Last 3 Encounters:  04/13/22 255 lb 11.7 oz (116 kg)  04/12/22 250 lb 3.2 oz (113.5 kg)  04/05/22 252 lb 3.2 oz (114.4 kg)   Physical Exam Vitals reviewed.  Constitutional:      Appearance: Normal appearance. She is obese.  Cardiovascular:     Rate and Rhythm: Normal rate and regular rhythm.     Pulses: Normal pulses.     Heart sounds: Normal heart sounds.  Pulmonary:     Effort: Pulmonary effort is normal.     Breath sounds: Normal breath sounds.   Neurological:     General: No focal deficit present.     Mental Status: She is alert and oriented to person, place, and time.  Psychiatric:        Mood and Affect: Mood normal.        Behavior: Behavior normal.    LABORATORY DATA:  I have reviewed the labs as listed.     Latest Ref Rng & Units 05/02/2022   10:28 AM 04/26/2022   12:33 PM 04/16/2022    4:42 AM  CBC  WBC 4.0 - 10.5 K/uL 5.3  6.0  7.6   Hemoglobin 12.0 - 15.0 g/dL 7.1  8.1  7.5   Hematocrit 36.0 - 46.0 % 22.4  25.6  23.5   Platelets 150 - 400 K/uL 138  101  77       Latest Ref Rng & Units 05/02/2022   10:28 AM 04/26/2022   12:33 PM 04/16/2022    4:42 AM  CMP  Glucose 70 - 99 mg/dL 253  143  188   BUN 6 - 20 mg/dL _0 Creatinine 0.44 - 1.00 mg/dL 1.13  0.97  0.98  Sodium 135 - 145 mmol/L 135  133  139   Potassium 3.5 - 5.1 mmol/L 3.8  3.8  4.4   Chloride 98 - 111 mmol/L 99  97  111   CO2 22 - 32 mmol/L _0 Calcium 8.9 - 10.3 mg/dL 8.4  8.7  8.8   Total Protein 6.5 - 8.1 g/dL 6.3  6.3  5.4   Total Bilirubin 0.3 - 1.2 mg/dL 1.0  1.1  0.5   Alkaline Phos 38 - 126 U/L 120  127  112   AST 15 - 41 U/L _1 ALT 0 - 44 U/L _2 DIAGNOSTIC IMAGING:  I have independently reviewed the scans and discussed with the patient. CT Abdomen Pelvis Wo Contrast  Result Date: 05/02/2022 CLINICAL DATA:  Flank pain, kidney stone suspected. Right lower quadrant pain with recurrent nephrolithiasis. EXAM: CT ABDOMEN AND PELVIS WITHOUT CONTRAST TECHNIQUE: Multidetector CT imaging of the abdomen and pelvis was performed following the standard protocol without IV contrast. RADIATION DOSE REDUCTION: This exam was performed according to the departmental dose-optimization program which includes automated exposure control, adjustment of the mA and/or kV according to patient size and/or use of iterative reconstruction technique. COMPARISON:  05/01/2021, 9920.2. FINDINGS: Lower chest: No acute abnormality.  Hepatobiliary: No focal liver abnormality is seen. Fatty infiltration of the liver is noted. No gallstones, gallbladder wall thickening, or biliary dilatation. Pancreas: Unremarkable. No pancreatic ductal dilatation or surrounding inflammatory changes. Spleen: Normal in size without focal abnormality. Adrenals/Urinary Tract: No adrenal nodule or mass. Nonobstructive calculi are present in the right kidney. No hydronephrosis bilaterally. Bladder is unremarkable. Stomach/Bowel: Stomach is within normal limits. Appendix appears normal. No bowel obstruction, free air, or pneumatosis. There is fatty infiltration of the walls of the ascending colon, compatible with chronic inflammatory changes. No acute inflammatory changes or bowel wall thickening. Vascular/Lymphatic: Aortic atherosclerosis. No enlarged abdominal or pelvic lymph nodes. Reproductive: Status post hysterectomy. No adnexal masses. Other: No abdominopelvic ascites. Musculoskeletal: A tiny focus of air is seen in the subcutaneous tissues in the anterior abdominal wall to the right of midline which may be iatrogenic. A stable mild compression deformities are present at T12 and L1. Schmorl's nodes are noted at multiple levels. Degenerative changes are present in the thoracolumbar spine. Stable scattered lytic lesions are present in the bones, which may be associated with patient's history of multiple myeloma. IMPRESSION: 1. Nonobstructive right renal calculi. 2. Hepatic steatosis. 3. Scattered lytic lesions in the bones, compatible with known history of multiple myeloma. No acute osseous abnormality is seen. 4. Aortic atherosclerosis. Electronically Signed   By: Brett Fairy M.D.   On: 05/02/2022 03:33   US Venous Img Lower Bilateral (DVT)  Result Date: 04/13/2022 CLINICAL DATA:  Bilateral lower extremity pain EXAM: BILATERAL LOWER EXTREMITY VENOUS DOPPLER ULTRASOUND TECHNIQUE: Gray-scale sonography with graded compression, as well as color Doppler and  duplex ultrasound were performed to evaluate the lower extremity deep venous systems from the level of the common femoral vein and including the common femoral, femoral, profunda femoral, popliteal and calf veins including the posterior tibial, peroneal and gastrocnemius veins when visible. The superficial great saphenous vein was also interrogated. Spectral Doppler was utilized to evaluate flow at rest and with distal augmentation maneuvers in the common femoral, femoral and popliteal veins. COMPARISON:  None Available. FINDINGS: RIGHT LOWER EXTREMITY Common Femoral Vein: No evidence of thrombus. Normal compressibility, respiratory  phasicity and response to augmentation. Saphenofemoral Junction: No evidence of thrombus. Normal compressibility and flow on color Doppler imaging. Profunda Femoral Vein: No evidence of thrombus. Normal compressibility and flow on color Doppler imaging. Femoral Vein: No evidence of thrombus. Normal compressibility, respiratory phasicity and response to augmentation. Popliteal Vein: No evidence of thrombus. Normal compressibility, respiratory phasicity and response to augmentation. Calf Veins: No evidence of thrombus. Normal compressibility and flow on color Doppler imaging. LEFT LOWER EXTREMITY Common Femoral Vein: No evidence of thrombus. Normal compressibility, respiratory phasicity and response to augmentation. Saphenofemoral Junction: No evidence of thrombus. Normal compressibility and flow on color Doppler imaging. Profunda Femoral Vein: No evidence of thrombus. Normal compressibility and flow on color Doppler imaging. Femoral Vein: No evidence of thrombus. Normal compressibility, respiratory phasicity and response to augmentation. Popliteal Vein: No evidence of thrombus. Normal compressibility, respiratory phasicity and response to augmentation. Calf Veins: No evidence of thrombus. Normal compressibility and flow on color Doppler imaging. Other Findings:  None. IMPRESSION: No  evidence of deep venous thrombosis in either lower extremity. Electronically Signed   By: Albin Felling M.D.   On: 04/13/2022 12:29   CT Angio Chest PE W and/or Wo Contrast  Result Date: 04/12/2022 CLINICAL DATA:  Shortness of breath and wheezing. EXAM: CT ANGIOGRAPHY CHEST WITH CONTRAST TECHNIQUE: Multidetector CT imaging of the chest was performed using the standard protocol during bolus administration of intravenous contrast. Multiplanar CT image reconstructions and MIPs were obtained to evaluate the vascular anatomy. RADIATION DOSE REDUCTION: This exam was performed according to the departmental dose-optimization program which includes automated exposure control, adjustment of the mA and/or kV according to patient size and/or use of iterative reconstruction technique. CONTRAST:  56m OMNIPAQUE IOHEXOL 350 MG/ML SOLN COMPARISON:  March 29, 2022 FINDINGS: Cardiovascular: A right-sided venous Port-A-Cath is in place. There is mild calcification of the descending thoracic aorta, without evidence of aortic aneurysm. The subsegmental pulmonary arteries are limited in evaluation secondary to suboptimal opacification with intravenous contrast. No evidence of pulmonary embolism. Normal heart size with marked severity coronary artery calcification. No pericardial effusion. Mediastinum/Nodes: No enlarged mediastinal, hilar, or axillary lymph nodes. Thyroid gland, trachea, and esophagus demonstrate no significant findings. Lungs/Pleura: Mild lingular and mild anteromedial right upper lobe linear atelectasis is seen. There is no evidence of acute infiltrate, pleural effusion or pneumothorax. Upper Abdomen: There is diffuse fatty infiltration of the liver parenchyma. Musculoskeletal: A chronic compression fracture deformity is seen at the level of T6 vertebral body. Sclerotic changes are also seen within this region. Small lytic lesions are also seen at the level of T6 and T7. Review of the MIP images confirms the  above findings. IMPRESSION: 1. No evidence of pulmonary embolism. 2. Mild lingular and mild anteromedial right upper lobe linear atelectasis. 3. Chronic compression fracture deformity at the level of T6 vertebral body. 4. Small lytic lesions at the level of T6 and T7. 5. Hepatic steatosis. 6. Aortic atherosclerosis. Aortic Atherosclerosis (ICD10-I70.0). Electronically Signed   By: TVirgina NorfolkM.D.   On: 04/12/2022 19:59   DG Chest Port 1 View  Result Date: 04/12/2022 CLINICAL DATA:  Sepsis, short of breath, wheezing, anemia, multiple myeloma EXAM: PORTABLE CHEST 1 VIEW COMPARISON:  02/19/2022 FINDINGS: Single frontal view of the chest demonstrates stable right chest wall port. The cardiac silhouette is unremarkable. No airspace disease, effusion, or pneumothorax. No acute bony abnormality. IMPRESSION: 1. No acute intrathoracic process. Electronically Signed   By: MRanda NgoM.D.   On: 04/12/2022 15:26  ASSESSMENT:  1.  IgG lambda multiple myeloma, stage III, del 17 p: -4 cycles of RVD from 11/01/2017 through 01/14/2018. -Stem cell transplant on 02/20/2018 at [facility]. -Maintenance Velcade every 2 weeks and Revlimid 10 mg 3 weeks on/1 week off started on 07/29/2018. -Myeloma panel from 12/08/2018 shows M spike not observed.  Kappa light chains of 32.1 with ratio of 1.64.  Immunofixation was negative. 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  MRI of the brain on 12/27/2019 shows acute ischemic nonhemorrhagic left MCA territory infarct involving left parietal lobe, corresponding with perfusion deficit on CT scan angiogram.  No associated hemorrhage or mass-effect.  Additional few scattered punctate acute ischemic nonhemorrhagic infarcts involving bilateral frontal and parietal lobes as well as left cerebellum. - Revlimid was on hold since June 2021 due to potential for contributing to CVA. - Velcade dose was reduced to 1 mg per metered square on 12/28/2020 due to worsening neuropathy in the feet.,   Pomalidomide 2 mg 3 weeks on/1 week off started around 09/20/2021, discontinued on 01/17/2022 due to progression. - Daratumumab, carfilzomib and dexamethasone (DKd) started on 01/25/2022   2.  CVA with aphasia: -We held her myeloma treatments since CVA with aphasia on 12/26/2019. -Her aphasia is improving.   PLAN:  1.  IgG lambda multiple myeloma, stage III, del 17 p: - Cycle 3-day 1 of carfilzomib and Darzalex on 04/05/2022. - She has missed day 22 doses of both cycle 1 and 2. - Reviewed myeloma panel (04/26/2022): M spike is 0.3 g, 0.3 g (04/05/2022), 0.2 g (02/15/2022), 0.2 g (01/03/2022). - Lambda light chains 412 (01/03/2022), 732 (02/15/2022), 1107 (04/05/2022, 1266 (04/26/2022). - CBC shows hemoglobin 7.1, PLT 138, creatinine increased to 1.17 from 0.97.  Calcium is 8.4 with albumin 2.6. - We will hold her treatment as she is feeling very weak. - She will receive IV fluids with magnesium supplements. - She will also receive 1 unit PRBC. - She did not get adequate response with the current regimen.  I will reach out to Dr. Laverta Baltimore to talk about by specific antibodies/CAR-T therapy. - RTC 1 week for follow-up.  Addendum: I have talked to Dr. Laverta Baltimore on 05/04/2022.  While he is making arrangements for her to be eligible and receive Bispecific antibodies/CAR-T, we will proceed with Of therapy with selinexor and dexamethasone.  We will start out with 80 mg twice weekly on days 1 and 3 along with 20 mg dexamethasone on both days.    2.  Hypomagnesemia: - Continue magnesium 3 times daily.  Magnesium today is low at 1.3.  She will receive IV magnesium.   3.  Bone strengthening agents: - Continue Zometa every 4 weeks.   4.  Peripheral neuropathy: - Continue Lyrica 200 mg twice daily.   5.  ID prophylaxis: - Continue acyclovir twice daily.   6.  Intractable nausea/vomiting - She has nausea since the start of this regimen.  However nausea gotten worse since 04/28/2022 and had vomited over the  weekend.  She is taking Ativan once daily and scopolamine patch. - She was told not to take Zofran and Compazine by her cardiologist. - We will start her on Reglan 10 mg every 6-8 hours as needed. - I will obtain MRI of the brain with and without contrast.   7.  Recurrent UTIs: - Continue Macrobid 100 mg daily for prophylaxis.   Orders placed this encounter:  No orders of the defined types were placed in this encounter.     Derek Jack,  MD [facility] [phone]

## 2022-05-04 ENCOUNTER — Other Ambulatory Visit (HOSPITAL_COMMUNITY): Payer: Self-pay

## 2022-05-04 LAB — TYPE AND SCREEN
ABO/RH(D): A POS
Antibody Screen: POSITIVE
Donor AG Type: NEGATIVE
Unit division: 0

## 2022-05-04 MED ORDER — SELINEXOR (80 MG TWICE WEEKLY) 20 MG PO TBPK
80.0000 mg | ORAL_TABLET | ORAL | 3 refills | Status: DC
  Filled 2022-05-04: qty 32, 28d supply, fill #0

## 2022-05-04 NOTE — Addendum Note (Signed)
Addended by: Derek Jack on: 05/04/2022 01:58 PM   Modules accepted: Orders

## 2022-05-07 ENCOUNTER — Encounter (HOSPITAL_COMMUNITY): Payer: Self-pay | Admitting: Hematology

## 2022-05-07 ENCOUNTER — Ambulatory Visit (HOSPITAL_COMMUNITY): Payer: BC Managed Care – PPO | Admitting: Occupational Therapy

## 2022-05-07 ENCOUNTER — Encounter: Payer: Self-pay | Admitting: Hematology

## 2022-05-07 ENCOUNTER — Telehealth: Payer: Self-pay

## 2022-05-07 ENCOUNTER — Other Ambulatory Visit: Payer: Self-pay | Admitting: Pharmacist

## 2022-05-07 ENCOUNTER — Other Ambulatory Visit: Payer: Self-pay | Admitting: Hematology

## 2022-05-07 ENCOUNTER — Telehealth: Payer: Self-pay | Admitting: *Deleted

## 2022-05-07 ENCOUNTER — Telehealth: Payer: Self-pay | Admitting: Pharmacist

## 2022-05-07 ENCOUNTER — Other Ambulatory Visit (HOSPITAL_COMMUNITY): Payer: Self-pay

## 2022-05-07 DIAGNOSIS — C9000 Multiple myeloma not having achieved remission: Secondary | ICD-10-CM

## 2022-05-07 MED ORDER — SELINEXOR (80 MG TWICE WEEKLY) 20 MG PO TBPK: 80.0000 mg | ORAL_TABLET | ORAL | 3 refills | Status: AC

## 2022-05-07 MED ORDER — OXYCODONE-ACETAMINOPHEN 5-325 MG PO TABS: 1.0000 | ORAL_TABLET | Freq: Two times a day (BID) | ORAL | 0 refills | Status: DC | PRN

## 2022-05-07 NOTE — Telephone Encounter (Signed)
Oral Oncology Patient Advocate Encounter  Prior Authorization for Joanna Reid has been approved.    PA# [id number]  Effective dates: 10.30.23 through 04.27.24  Patients co-pay is $70.00.   [facility] is unable to order Xpovio. Script has been sent to [facility] by [employer] for order fulfillment.    Berdine Addison, [facility] Patient [place]  [phone] (phone) [phone] (fax) 05/07/2022 9:32 AM

## 2022-05-07 NOTE — Telephone Encounter (Signed)
Oral Oncology Patient Advocate Encounter   Was successful in obtaining a copay card for Xpovio.  This copay card will make the patients copay $5.  The billing information is as follows and has been shared with [facility].   RxBin: [id number] PCN: [id number] Member ID: [id number] Group ID: [id number]   Berdine Addison, [facility] Patient [place]  [phone] (phone) [phone] (fax) 05/07/2022 9:42 AM

## 2022-05-07 NOTE — Telephone Encounter (Addendum)
Oral Oncology Patient Advocate Encounter   Received notification that prior authorization for Xpovio is required.   PA submitted on 05/07/22  Key [id number]  Status is pending     Berdine Addison, [place] Patient [place]  [phone] (phone) [phone] (fax) 05/07/2022 8:11 AM

## 2022-05-07 NOTE — Telephone Encounter (Signed)
Family called to make [name] aware that Tramadol is not helping with her continued pain in back, arms, legs and abdomen.  Consulted with Dr. Delton Coombes and he will send in alternative therapy for her.

## 2022-05-07 NOTE — Telephone Encounter (Signed)
Oral Oncology Pharmacist Encounter  Received new prescription for Xpovio (selinexor) for the treatment of progressive IgG lambda multiple myeloma in conjunction with dexamethasone. She will be using this as a bridge to potential CART or Bispecific therapy.  CBC from 05/02/22 assessed, patient had low hgb and was given a transfusion on 05/03/22. With selinexor her hgb will need to continue to be monitored and dose decreased if hgb <8. Prescription dose and frequency assessed.   Current medication list in Epic reviewed, no DDIs with selinexor identified.   Evaluated chart and no patient barriers to medication adherence identified.   Prescription has been e-scribed to [facility].  Oral Oncology Clinic will continue to follow for insurance authorization, copayment issues, initial counseling and start date.   Darl Pikes, PharmD, BCPS, BCOP, CPP Hematology/Oncology Clinical Pharmacist Practitioner [place]/DB/AP Oral [facility] [phone]  05/07/2022 10:23 AM

## 2022-05-07 NOTE — Telephone Encounter (Signed)
Information shared with [facility] since [facility] does not have Xpovio medication access.

## 2022-05-07 NOTE — Telephone Encounter (Signed)
Oral Chemotherapy Pharmacist Encounter  Provided Joanna Reid with the phone number to [facility]. He knows to call them by Wednesday if they have not already reached out.   Patient Education I spoke with patient's husband Joanna Reid for overview of new oral chemotherapy medication: Xpovio (selinexor) for the treatment of progressive IgG lambda multiple myeloma in conjunction with dexamethasone. She will be using this as a bridge to potential CART or Bispecific therapy.   [place] on administration, dosing, side effects, monitoring, drug-food interactions, safe handling, storage, and disposal. Patient will take 4 tablets (80 mg total) by mouth 2 (two) times a week. Take on days 1 and 3 of each week.  Side effects include but not limited to: nausea, diarrhea, decreased wbc/hgb/plt, and fatigue.  Per MD, patient's cardiologist wants her to avoid ondansetron and prochlorperazine. She has be prescribed Reglan to help with nausea management.   Reviewed with Joanna Reid importance of keeping a medication schedule and plan for any missed doses.  After discussion with Joanna Reid no patient barriers to medication adherence identified.   Mr. Stalker voiced understanding and appreciation. All questions answered. Medication handout provided.  Provided Joanna Reid with [facility]otherapy [facility] phone number. Joanna Reid knows to call the office with questions or concerns. Oral Chemotherapy Navigation Clinic will continue to follow.  Darl Pikes, PharmD, BCPS, BCOP, CPP Hematology/Oncology Clinical Pharmacist Practitioner [place]/DB/AP Oral [facility] [phone]  05/07/2022 4:04 PM

## 2022-05-08 ENCOUNTER — Ambulatory Visit (HOSPITAL_COMMUNITY): Payer: BC Managed Care – PPO

## 2022-05-08 ENCOUNTER — Ambulatory Visit (HOSPITAL_COMMUNITY): Admission: RE | Admit: 2022-05-08 | Payer: BC Managed Care – PPO | Source: Ambulatory Visit

## 2022-05-08 MED ORDER — DEXAMETHASONE 4 MG PO TABS: 20.0000 mg | ORAL_TABLET | ORAL | 3 refills | Status: AC

## 2022-05-08 NOTE — Telephone Encounter (Signed)
Oral Chemotherapy Pharmacist Encounter   Prescription for dexamethasone that Joanna Reid will be taking along with her Joanna Reid was sent to her local pharmacy. Called [name] to let him know and review the dosing/admin instructions.    Darl Pikes, PharmD, BCPS, BCOP, CPP Hematology/Oncology Clinical Pharmacist [place]/DB/AP Oral [facility] [phone]  05/08/2022 9:14 AM

## 2022-05-08 NOTE — Addendum Note (Signed)
Addended by: Darl Pikes on: 05/08/2022 09:12 AM   Modules accepted: Orders

## 2022-05-09 ENCOUNTER — Telehealth: Payer: Self-pay

## 2022-05-09 ENCOUNTER — Other Ambulatory Visit: Payer: Self-pay

## 2022-05-09 ENCOUNTER — Inpatient Hospital Stay: Payer: BC Managed Care – PPO | Attending: Hematology

## 2022-05-09 DIAGNOSIS — Z87440 Personal history of urinary (tract) infections: Secondary | ICD-10-CM | POA: Diagnosis not present

## 2022-05-09 DIAGNOSIS — Z9484 Stem cells transplant status: Secondary | ICD-10-CM | POA: Insufficient documentation

## 2022-05-09 DIAGNOSIS — C9000 Multiple myeloma not having achieved remission: Secondary | ICD-10-CM | POA: Diagnosis not present

## 2022-05-09 DIAGNOSIS — G629 Polyneuropathy, unspecified: Secondary | ICD-10-CM | POA: Diagnosis not present

## 2022-05-09 DIAGNOSIS — D649 Anemia, unspecified: Secondary | ICD-10-CM | POA: Diagnosis not present

## 2022-05-09 DIAGNOSIS — I69920 Aphasia following unspecified cerebrovascular disease: Secondary | ICD-10-CM | POA: Diagnosis not present

## 2022-05-09 DIAGNOSIS — Z8616 Personal history of COVID-19: Secondary | ICD-10-CM | POA: Insufficient documentation

## 2022-05-09 LAB — COMPREHENSIVE METABOLIC PANEL
ALT: 12 U/L (ref 0–44)
AST: 18 U/L (ref 15–41)
Albumin: 2.5 g/dL — ABNORMAL LOW (ref 3.5–5.0)
Alkaline Phosphatase: 168 U/L — ABNORMAL HIGH (ref 38–126)
Anion gap: 12 (ref 5–15)
BUN: 25 mg/dL — ABNORMAL HIGH (ref 6–20)
CO2: 25 mmol/L (ref 22–32)
Calcium: 8.5 mg/dL — ABNORMAL LOW (ref 8.9–10.3)
Chloride: 99 mmol/L (ref 98–111)
Creatinine, Ser: 1.14 mg/dL — ABNORMAL HIGH (ref 0.44–1.00)
GFR, Estimated: 56 mL/min — ABNORMAL LOW (ref >60)
Glucose, Bld: 131 mg/dL — ABNORMAL HIGH (ref 70–99)
Potassium: 3.4 mmol/L — ABNORMAL LOW (ref 3.5–5.1)
Sodium: 136 mmol/L (ref 135–145)
Total Bilirubin: 0.9 mg/dL (ref 0.3–1.2)
Total Protein: 6 g/dL — ABNORMAL LOW (ref 6.5–8.1)

## 2022-05-09 LAB — CBC WITH DIFFERENTIAL/PLATELET
Abs Immature Granulocytes: 0.04 10*3/uL (ref 0.00–0.07)
Basophils Absolute: 0 10*3/uL (ref 0.0–0.1)
Basophils Relative: 0 %
Eosinophils Absolute: 0 10*3/uL (ref 0.0–0.5)
Eosinophils Relative: 0 %
HCT: 22.9 % — ABNORMAL LOW (ref 36.0–46.0)
Hemoglobin: 7.2 g/dL — ABNORMAL LOW (ref 12.0–15.0)
Immature Granulocytes: 1 %
Lymphocytes Relative: 27 %
Lymphs Abs: 1.2 10*3/uL (ref 0.7–4.0)
MCH: 30.6 pg (ref 26.0–34.0)
MCHC: 31.4 g/dL (ref 30.0–36.0)
MCV: 97.4 fL (ref 80.0–100.0)
Monocytes Absolute: 0.8 10*3/uL (ref 0.1–1.0)
Monocytes Relative: 19 %
Neutro Abs: 2.3 10*3/uL (ref 1.7–7.7)
Neutrophils Relative %: 53 %
Platelets: 212 10*3/uL (ref 150–400)
RBC: 2.35 MIL/uL — ABNORMAL LOW (ref 3.87–5.11)
RDW: 17.1 % — ABNORMAL HIGH (ref 11.5–15.5)
WBC: 4.4 10*3/uL (ref 4.0–10.5)
nRBC: 0.9 % — ABNORMAL HIGH (ref 0.0–0.2)

## 2022-05-09 LAB — SAMPLE TO BLOOD BANK

## 2022-05-09 MED ORDER — SODIUM CHLORIDE 0.9% FLUSH
10.0000 mL | INTRAVENOUS | Status: DC | PRN
  Administered 2022-05-09: 10 mL via INTRAVENOUS

## 2022-05-09 MED ORDER — HEPARIN SOD (PORK) LOCK FLUSH 100 UNIT/ML IV SOLN
500.0000 [IU] | Freq: Once | INTRAVENOUS | Status: AC
  Administered 2022-05-09: 500 [IU] via INTRAVENOUS

## 2022-05-09 NOTE — Progress Notes (Signed)
Patient here today for port flush and labs per MD orders for treatment tomorrow and possible blood transfusion.  She reports overall doing well.  She doesn't have any complaints today.  She was brought in via wheelchair by her husband.  Port accessed without difficulty and flushed well. Blood return noted.  Labs drawn.  Patient tolerated well.  She remained stable during access.  She wanted to keep port accessed for treatment tomorrow.  Biopatch placed and dressing secured.  Patient was discharged via wheelchair with her husband in stable condition.  She is aware of her appt time tomorrow.

## 2022-05-09 NOTE — Addendum Note (Signed)
Addended by: Benjiman Core D on: 05/09/2022 02:44 PM   Modules accepted: Orders

## 2022-05-09 NOTE — Telephone Encounter (Signed)
Oral Oncology Patient Advocate Encounter  Received confirmation from [facility] that initial shipment of Xpovio was shipped on 10.31.23.  Shipment is scheduled to arrive to patient by 11.01.23.  Berdine Addison, [facility] Patient [place]  [phone] (phone) [phone] (fax) 05/09/2022 12:06 PM

## 2022-05-09 NOTE — Patient Instructions (Signed)
You had your port flushed today and labs drawn, you will come back tomorrow for treatment and possible blood.

## 2022-05-09 NOTE — Addendum Note (Signed)
Addended by: Benjiman Core D on: 05/09/2022 02:43 PM   Modules accepted: Orders

## 2022-05-10 ENCOUNTER — Other Ambulatory Visit: Payer: BC Managed Care – PPO

## 2022-05-10 ENCOUNTER — Inpatient Hospital Stay (HOSPITAL_BASED_OUTPATIENT_CLINIC_OR_DEPARTMENT_OTHER): Payer: BC Managed Care – PPO | Admitting: Hematology

## 2022-05-10 ENCOUNTER — Ambulatory Visit: Payer: BC Managed Care – PPO

## 2022-05-10 ENCOUNTER — Inpatient Hospital Stay: Payer: BC Managed Care – PPO

## 2022-05-10 VITALS — BP 109/62 | HR 81 | Temp 97.6°F | Resp 20 | Ht 64.0 in | Wt 242.0 lb

## 2022-05-10 VITALS — BP 114/84 | HR 71 | Temp 96.8°F | Resp 18

## 2022-05-10 DIAGNOSIS — C9002 Multiple myeloma in relapse: Secondary | ICD-10-CM

## 2022-05-10 DIAGNOSIS — C9000 Multiple myeloma not having achieved remission: Secondary | ICD-10-CM

## 2022-05-10 LAB — TYPE AND SCREEN
ABO/RH(D): A POS
Antibody Screen: POSITIVE
Donor AG Type: NEGATIVE
Unit division: 0

## 2022-05-10 LAB — PREPARE RBC (CROSSMATCH)

## 2022-05-10 MED ORDER — HEPARIN SOD (PORK) LOCK FLUSH 100 UNIT/ML IV SOLN
500.0000 [IU] | Freq: Every day | INTRAVENOUS | Status: AC | PRN
  Administered 2022-05-10: 500 [IU]

## 2022-05-10 MED ORDER — SODIUM CHLORIDE 0.9% FLUSH
10.0000 mL | INTRAVENOUS | Status: AC | PRN
  Administered 2022-05-10: 10 mL

## 2022-05-10 MED ORDER — SODIUM CHLORIDE 0.9% IV SOLUTION
250.0000 mL | Freq: Once | INTRAVENOUS | Status: AC
  Administered 2022-05-10: 250 mL via INTRAVENOUS

## 2022-05-10 MED ORDER — ACETAMINOPHEN 325 MG PO TABS
650.0000 mg | ORAL_TABLET | Freq: Once | ORAL | Status: AC
  Administered 2022-05-10: 650 mg via ORAL
  Filled 2022-05-10: qty 2

## 2022-05-10 MED ORDER — SODIUM CHLORIDE 0.9 % IV SOLN: INTRAVENOUS | Status: DC

## 2022-05-10 MED ORDER — MAGNESIUM SULFATE 2 GM/50ML IV SOLN
2.0000 g | Freq: Once | INTRAVENOUS | Status: AC
  Administered 2022-05-10: 2 g via INTRAVENOUS
  Filled 2022-05-10: qty 50

## 2022-05-10 MED ORDER — DIPHENHYDRAMINE HCL 25 MG PO CAPS
25.0000 mg | ORAL_CAPSULE | Freq: Once | ORAL | Status: AC
  Administered 2022-05-10: 25 mg via ORAL
  Filled 2022-05-10: qty 1

## 2022-05-10 NOTE — Progress Notes (Signed)
Pt presents today for treatment per provider's order. No treatment today. Pt  will receive 1 unit of blood and 2g IV magnesium sulfate per Dr.K.  1 unit of blood and 2g IV magnesium sulfate given today per MD orders. Tolerated infusion without adverse affects. Vital signs stable. No complaints at this time. Discharged from clinic via wheelchar in stable condition. Alert and oriented x 3. F/U with [facility] as scheduled.

## 2022-05-10 NOTE — Patient Instructions (Signed)
[facility]  Discharge Instructions: Thank you for choosing [facility] to provide your oncology and hematology care.  If you have a lab appointment with the [facility], please come in thru the Main Entrance and check in at the main information desk.  Wear comfortable clothing and clothing appropriate for easy access to any Portacath or PICC line.   We strive to give you quality time with your provider. You may need to reschedule your appointment if you arrive late (15 or more minutes).  Arriving late affects you and other patients whose appointments are after yours.  Also, if you miss three or more appointments without notifying the office, you may be dismissed from the clinic at the provider's discretion.      For prescription refill requests, have your pharmacy contact our office and allow 72 hours for refills to be completed.    Today you received 1 unit of blood and 2g IV magnesium sulfate  BELOW ARE SYMPTOMS THAT SHOULD BE REPORTED IMMEDIATELY: *FEVER GREATER THAN 100.4 F (38 C) OR HIGHER *CHILLS OR SWEATING *NAUSEA AND VOMITING THAT IS NOT CONTROLLED WITH YOUR NAUSEA MEDICATION *UNUSUAL SHORTNESS OF BREATH *UNUSUAL BRUISING OR BLEEDING *URINARY PROBLEMS (pain or burning when urinating, or frequent urination) *BOWEL PROBLEMS (unusual diarrhea, constipation, pain near the anus) TENDERNESS IN MOUTH AND THROAT WITH OR WITHOUT PRESENCE OF ULCERS (sore throat, sores in mouth, or a toothache) UNUSUAL RASH, SWELLING OR PAIN  UNUSUAL VAGINAL DISCHARGE OR ITCHING   Items with * indicate a potential emergency and should be followed up as soon as possible or go to the Emergency Department if any problems should occur.  Please show the CHEMOTHERAPY ALERT CARD or IMMUNOTHERAPY ALERT CARD at check-in to the Emergency Department and triage nurse.  Should you have questions after your visit or need to cancel or reschedule your appointment, please contact  [facility] [phone]  and follow the prompts.  Office hours are 8:00 a.m. to 4:30 p.m. Monday - Friday. Please note that voicemails left after 4:00 p.m. may not be returned until the following business day.  We are closed weekends and major holidays. You have access to a nurse at all times for urgent questions. Please call the main number to the clinic [phone] and follow the prompts.  For any non-urgent questions, you may also contact your provider using MyChart. We now offer e-Visits for anyone 27 and older to request care online for non-urgent symptoms. For details visit [email or web address].   Also download the MyChart app! Go to the app store, search "MyChart", open the app, select [facility], and log in with your MyChart username and password.  Masks are optional in the cancer centers. If you would like for your care team to wear a mask while they are taking care of you, please let them know. You may have one support person who is at least 57 years old accompany you for your appointments.

## 2022-05-10 NOTE — Progress Notes (Signed)
Joanna Reid, [place] [address]   CLINIC:  Medical Oncology/Hematology  PCP:  Lindell Spar, MD [address] / [place] [address] [phone]   REASON FOR VISIT:  Follow-up for multiple myeloma  PRIOR THERAPY:  1. RVD x 4 cycles from 11/01/2017 through 01/14/2018. 2. Stem cell transplant on 02/20/2018. 3. Velcade from 06/11/2018 to 12/22/2019.  NGS Results: not done  CURRENT THERAPY: Daratumumab, carfilzomib and dexamethasone  BRIEF ONCOLOGIC HISTORY:  Oncology History  Multiple myeloma ([place])  10/29/2017 Initial Diagnosis   Multiple myeloma ([place])   11/01/2017 - 01/24/2018 Chemotherapy   The patient had dexamethasone (DECADRON) 4 MG tablet, 1 of 1 cycle, Start date: --, End date: -- lenalidomide (REVLIMID) 25 MG capsule, 1 of 1 cycle, Start date: --, End date: -- bortezomib SQ (VELCADE) chemo injection 3 mg, 1.3 mg/m2 = 3 mg, Subcutaneous,  Once, 5 of 5 cycles Administration: 3 mg (11/01/2017), 3 mg (11/08/2017), 3 mg (11/05/2017), 3 mg (11/22/2017), 3 mg (11/12/2017), 3 mg (11/29/2017), 3 mg (11/26/2017), 3 mg (12/03/2017), 3 mg (12/13/2017), 3 mg (12/20/2017), 3 mg (12/17/2017), 3 mg (12/24/2017), 3 mg (01/03/2018), 3 mg (01/10/2018), 3 mg (01/07/2018), 3 mg (01/14/2018), 3 mg (01/24/2018)  for chemotherapy treatment.    06/11/2018 - 12/20/2021 Chemotherapy   Patient is on Treatment Plan : MYELOMA MAINTENANCE Bortezomib SQ q 7d x 6 weeks, two weeks off then q 14d     01/25/2022 - 02/08/2022 Chemotherapy   Patient is on Treatment Plan : MYELOMA RELAPSED/REFRACTORY Carfilzomib (20/70) + Daratumumab SQ + Dexamethasone (20/40) DaraKd q28d     01/25/2022 -  Chemotherapy   Patient is on Treatment Plan : MYELOMA RELAPSED/REFRACTORY Carfilzomib (20/70) + Daratumumab SQ + Dexamethasone (20/40) DaraKd q28d       CANCER STAGING:  Cancer Staging  Multiple myeloma ([place]) Staging form: Plasma Cell Myeloma and Plasma Cell Disorders, AJCC 8th Edition - Clinical: No  stage assigned - Unsigned - Clinical: No stage assigned - Unsigned   INTERVAL HISTORY:  Joanna Reid, a 56 y.o. female, seen for follow-up of multiple myeloma.  Last 2 days she has been feeling very well.  Energy levels are reported as 40%.  Back pain is stable.  She is taking Reglan for nausea which is controlling it better.  REVIEW OF SYSTEMS:  Review of Systems  Constitutional:  Negative for appetite change and fatigue.  Gastrointestinal:  Negative for diarrhea.  Musculoskeletal:  Positive for back pain.  All other systems reviewed and are negative.   PAST MEDICAL/SURGICAL HISTORY:  Past Medical History:  Diagnosis Date   Acid reflux    Allergic rhinitis    Cancer ([place])    multiple myeloma   Diabetes mellitus    type 2   Gout    Gout    H/o COVID-19--- was Positive 09/17/2019, Negative 12/23/19 AND also Neg on  12/26/19 12/26/2019   HBP (high blood pressure)    History of kidney stones    Migraines    Multiple myeloma ([facility]) 10/29/2017   Past Surgical History:  Procedure Laterality Date   BREAST CYST EXCISION Left    2009 no visible scar on skin   CESAREAN SECTION     COLONOSCOPY WITH PROPOFOL N/A 12/25/2019   Procedure: COLONOSCOPY WITH PROPOFOL;  Surgeon: Rogene Houston, MD;  Location: [facility];  Service: Endoscopy;  Laterality: N/A;  730   EXTRACORPOREAL SHOCK WAVE LITHOTRIPSY Left 10/10/2017   Procedure: LEFT EXTRACORPOREAL SHOCK  WAVE LITHOTRIPSY (ESWL);  Surgeon: Bjorn Loser, MD;  Location: [facility];  Service: Urology;  Laterality: Left;   EYE SURGERY     HEMORRHOID SURGERY N/A 11/19/2012   Procedure: HEMORRHOIDECTOMY;  Surgeon: Jamesetta So, MD;  Location: [facility];  Service: General;  Laterality: N/A;   IR IMAGING GUIDED PORT INSERTION  01/18/2022   kidney stones  1998   LAPAROSCOPIC UNILATERAL SALPINGO OOPHERECTOMY  05/14/2012   Procedure: LAPAROSCOPIC UNILATERAL SALPINGO OOPHORECTOMY;  Surgeon: [name], MD;  Location: [facility];  Service:  Gynecology;  Laterality: Right;  laparoscopic right salpingo-oophorectomy   PARTIAL HYSTERECTOMY     POLYPECTOMY  12/25/2019   Procedure: POLYPECTOMY;  Surgeon: Rogene Houston, MD;  Location: [facility];  Service: Endoscopy;;   TONSILECTOMY, ADENOIDECTOMY, BILATERAL MYRINGOTOMY AND TUBES     VESICOVAGINAL FISTULA CLOSURE W/ TAH      SOCIAL HISTORY:  Social History   Socioeconomic History   Marital status: Married    Spouse name: [name]   Number of children: 1   Years of education: 12   Highest education level: Some college, no degree  Occupational History    Employer: [employer]  Tobacco Use   Smoking status: Former    Types: Cigarettes    Quit date: 02/27/1999    Years since quitting: 23.2   Smokeless tobacco: Never   Tobacco comments:    socially   Vaping Use   Vaping Use: Never used  Substance and Sexual Activity   Alcohol use: No    Alcohol/week: 0.0 standard drinks of alcohol   Drug use: No   Sexual activity: Yes    Birth control/protection: Surgical    Comment: hyst  Other Topics Concern   Not on file  Social History Narrative   Not on file   Social Determinants of Health   Financial Resource Strain: Low Risk  (08/08/2021)   Overall Financial Resource Strain (CARDIA)    Difficulty of Paying Living Expenses: Not hard at all  Food Insecurity: No Food Insecurity (04/13/2022)   Hunger Vital Sign    Worried About Running Out of Food in the Last Year: Never true    Ran Out of Food in the Last Year: Never true  Transportation Needs: No Transportation Needs (04/13/2022)   PRAPARE - Hydrologist (Medical): No    Lack of Transportation (Non-Medical): No  Physical Activity: Unknown (08/08/2021)   Exercise Vital Sign    Days of Exercise per Week: Not on file    Minutes of Exercise per Session: 10 min  Stress: No Stress Concern Present (08/08/2021)   [place]    Feeling of  Stress : Not at all  Social Connections: [place] (08/08/2021)   Social Connection and Isolation Panel [NHANES]    Frequency of Communication with Friends and Family: More than three times a week    Frequency of Social Gatherings with Friends and Family: Twice a week    Attends Religious Services: More than 4 times per year    Active Member of [employer] or Organizations: Yes    Attends Archivist Meetings: More than 4 times per year    Marital Status: Married  Human resources officer Violence: Not At Risk (04/13/2022)   Humiliation, Afraid, Rape, and Kick questionnaire    Fear of Current or Ex-Partner: No    Emotionally Abused: No    Physically Abused: No    Sexually Abused: No  FAMILY HISTORY:  Family History  Problem Relation Age of Onset   Arthritis Other    Cancer Other    Diabetes Other    Hypertension Mother    Dementia Mother    Diabetes Father    ALS Father    Diabetes Brother    Hypertension Brother    Cancer Paternal Aunt    COPD Maternal Grandmother    Cancer Maternal Grandfather    Anesthesia problems Paternal Grandfather     CURRENT MEDICATIONS:  Current Outpatient Medications  Medication Sig Dispense Refill   acyclovir (ZOVIRAX) 400 MG tablet TAKE (1) TABLET BY MOUTH TWICE DAILY. 60 tablet 6   albuterol (VENTOLIN HFA) 108 (90 Base) MCG/ACT inhaler Inhale 2 puffs into the lungs every 4 (four) hours as needed for wheezing or shortness of breath. 18 g 3   allopurinol (ZYLOPRIM) 300 MG tablet TAKE (1) TABLET BY MOUTH ONCE DAILY. 90 tablet 0   amLODipine (NORVASC) 2.5 MG tablet Take 2.5 mg by mouth daily.     aspirin EC 81 MG tablet Take 1 tablet (81 mg total) by mouth daily with breakfast. For stroke prophylaxis 30 tablet 11   BD PEN NEEDLE NANO U/F 32G X 4 MM MISC Inject into the skin.     bumetanide (BUMEX) 0.5 MG tablet Take 0.5 mg by mouth daily.      carvedilol (COREG) 25 MG tablet Take 1 tablet (25 mg total) by mouth 2 (two) times daily.      DARZALEX FASPRO 1800-30000 MG-UT/15ML SOLN Inject 15 ml over 3-5 minutes subcutaneously once every 2 weeks. 120 mL 0   dexamethasone (DECADRON) 4 MG tablet Take 5 tablets (20 mg total) by mouth 2 (two) times a week. Take on day 1 and day 3 along with Xpovio. 40 tablet 3   diclofenac Sodium (VOLTAREN) 1 % GEL Apply 2 g topically 4 (four) times daily. 100 g 0   docusate sodium (COLACE) 100 MG capsule Take 100 mg by mouth daily.      Dulaglutide 1.5 MG/0.5ML SOPN Inject 1.5 mg into the skin every 7 (seven) days.     escitalopram (LEXAPRO) 10 MG tablet TAKE (1) TABLET BY MOUTH ONCE DAILY. MAY START WITH 1/2 TABLET FOR 7 DAYS. 90 tablet 0   fluticasone (FLONASE) 50 MCG/ACT nasal spray INSTILL 2 SPRAYS INTO BOTH NOSTRILS DAILY 16 g 4   Insulin Lispro w/ Trans Port 100 UNIT/ML SOPN Inject 20 Units into the skin See admin instructions. Inject before meals per sliding scale Max TDD 20 units     KYPROLIS 30 MG SOLR Inject into the vein.     Lactulose 20 GM/30ML SOLN Take 30 mLs (20 g total) by mouth daily as needed. 473 mL 2   lidocaine-prilocaine (EMLA) cream Apply 1 Application topically as needed (Apply to port prior to treatment and flushes). 30 g 6   LORazepam (ATIVAN) 0.5 MG tablet TAKE (1) TABLET BY MOUTH EVERY EIGHT HOURS 30 tablet 2   losartan (COZAAR) 100 MG tablet Take 100 mg by mouth daily.     magnesium oxide (MAG-OX) 400 (241.3 Mg) MG tablet Take 1 tablet (400 mg total) by mouth 3 (three) times daily. 90 tablet 3   metFORMIN (GLUCOPHAGE) 500 MG tablet Take 1 tablet (500 mg total) by mouth 2 (two) times daily. 60 tablet 4   metoCLOPramide (REGLAN) 10 MG tablet Take 1 tablet (10 mg total) by mouth every 8 (eight) hours as needed for nausea. 90 tablet 3   nitrofurantoin,  macrocrystal-monohydrate, (MACROBID) 100 MG capsule TAKE (1) CAPSULE BY MOUTH AT BEDTIME. (Patient taking differently: Take 100 mg by mouth at bedtime.) 90 capsule 0   NOVOLOG FLEXPEN 100 UNIT/ML FlexPen Inject into the skin.      oxyCODONE-acetaminophen (PERCOCET/ROXICET) 5-325 MG tablet Take 1 tablet by mouth 2 (two) times daily as needed for severe pain. 30 tablet 0   pantoprazole (PROTONIX) 40 MG tablet TAKE (1) TABLET BY MOUTH ONCE DAILY. (Patient taking differently: Take 40 mg by mouth daily.) 90 tablet 0   polyethylene glycol (MIRALAX / GLYCOLAX) 17 g packet Take 17 g by mouth daily as needed for mild constipation. 14 each 0   pregabalin (LYRICA) 200 MG capsule TAKE (1) CAPSULE BY MOUTH TWICE DAILY. (Patient taking differently: Take 200 mg by mouth 2 (two) times daily.) 60 capsule 11   Propylene Glycol (SYSTANE BALANCE) 0.6 % SOLN Apply 1 drop to eye daily as needed (dry eye).      rosuvastatin (CRESTOR) 5 MG tablet Take 1 tablet (5 mg total) by mouth daily. 90 tablet 3   scopolamine (TRANSDERM-SCOP) 1 MG/3DAYS Place 1 patch (1.5 mg total) onto the skin every 3 (three) days. 10 patch 12   selinexor (XPOVIO) Therapy Pack (80 mg twice weekly) Take 4 tablets (80 mg total) by mouth 2 (two) times a week. Take on days 1 and 3 of each week. 32 tablet 3   traMADol (ULTRAM) 50 MG tablet Take 1 tablet (50 mg total) by mouth every 8 (eight) hours as needed for severe pain. 30 tablet 0   valACYclovir (VALTREX) 1000 MG tablet Take 1 tablet (1,000 mg total) by mouth See admin instructions. TAKE 2 TABLETS BY MOUTH NOW; THEN 2 tab about 12 HOURS LATER--- for outbreaks 30 tablet 0   No current facility-administered medications for this visit.   Facility-Administered Medications Ordered in Other Visits  Medication Dose Route Frequency Provider Last Rate Last Admin   0.9 %  sodium chloride infusion   Intravenous Continuous Derek Jack, MD   Stopped at 05/10/22 1206    ALLERGIES:  No Known Allergies  PHYSICAL EXAM:  Performance status (ECOG): 1 - Symptomatic but completely ambulatory  Vitals:   05/10/22 1005  BP: 109/62  Pulse: 81  Resp: 20  Temp: 97.6 F (36.4 C)  SpO2: 98%   Wt Readings from Last 3 Encounters:   05/10/22 242 lb (109.8 kg)  05/09/22 239 lb 12.8 oz (108.8 kg)  05/03/22 242 lb 4.8 oz (109.9 kg)   Physical Exam Vitals reviewed.  Constitutional:      Appearance: Normal appearance. She is obese.  Cardiovascular:     Rate and Rhythm: Normal rate and regular rhythm.     Pulses: Normal pulses.     Heart sounds: Normal heart sounds.  Pulmonary:     Effort: Pulmonary effort is normal.     Breath sounds: Normal breath sounds.  Neurological:     General: No focal deficit present.     Mental Status: She is alert and oriented to person, place, and time.  Psychiatric:        Mood and Affect: Mood normal.        Behavior: Behavior normal.     LABORATORY DATA:  I have reviewed the labs as listed.     Latest Ref Rng & Units 05/09/2022    1:15 PM 05/02/2022   10:28 AM 04/26/2022   12:33 PM  CBC  WBC 4.0 - 10.5 K/uL 4.4  5.3  6.0  Hemoglobin 12.0 - 15.0 g/dL 7.2  7.1  8.1   Hematocrit 36.0 - 46.0 % 22.9  22.4  25.6   Platelets 150 - 400 K/uL 212  138  101       Latest Ref Rng & Units 05/09/2022    1:15 PM 05/02/2022   10:28 AM 04/26/2022   12:33 PM  CMP  Glucose 70 - 99 mg/dL 131  253  143   BUN 6 - 20 mg/dL _0 Creatinine 0.44 - 1.00 mg/dL 1.14  1.13  0.97   Sodium 135 - 145 mmol/L 136  135  133   Potassium 3.5 - 5.1 mmol/L 3.4  3.8  3.8   Chloride 98 - 111 mmol/L 99  99  97   CO2 22 - 32 mmol/L _1 Calcium 8.9 - 10.3 mg/dL 8.5  8.4  8.7   Total Protein 6.5 - 8.1 g/dL 6.0  6.3  6.3   Total Bilirubin 0.3 - 1.2 mg/dL 0.9  1.0  1.1   Alkaline Phos 38 - 126 U/L 168  120  127   AST 15 - 41 U/L _2 ALT 0 - 44 U/L _3 DIAGNOSTIC IMAGING:  I have independently reviewed the scans and discussed with the patient. CT Abdomen Pelvis Wo Contrast  Result Date: 05/02/2022 CLINICAL DATA:  Flank pain, kidney stone suspected. Right lower quadrant pain with recurrent nephrolithiasis. EXAM: CT ABDOMEN AND PELVIS WITHOUT CONTRAST TECHNIQUE:  Multidetector CT imaging of the abdomen and pelvis was performed following the standard protocol without IV contrast. RADIATION DOSE REDUCTION: This exam was performed according to the departmental dose-optimization program which includes automated exposure control, adjustment of the mA and/or kV according to patient size and/or use of iterative reconstruction technique. COMPARISON:  05/01/2021, 9920.2. FINDINGS: Lower chest: No acute abnormality. Hepatobiliary: No focal liver abnormality is seen. Fatty infiltration of the liver is noted. No gallstones, gallbladder wall thickening, or biliary dilatation. Pancreas: Unremarkable. No pancreatic ductal dilatation or surrounding inflammatory changes. Spleen: Normal in size without focal abnormality. Adrenals/Urinary Tract: No adrenal nodule or mass. Nonobstructive calculi are present in the right kidney. No hydronephrosis bilaterally. Bladder is unremarkable. Stomach/Bowel: Stomach is within normal limits. Appendix appears normal. No bowel obstruction, free air, or pneumatosis. There is fatty infiltration of the walls of the ascending colon, compatible with chronic inflammatory changes. No acute inflammatory changes or bowel wall thickening. Vascular/Lymphatic: Aortic atherosclerosis. No enlarged abdominal or pelvic lymph nodes. Reproductive: Status post hysterectomy. No adnexal masses. Other: No abdominopelvic ascites. Musculoskeletal: A tiny focus of air is seen in the subcutaneous tissues in the anterior abdominal wall to the right of midline which may be iatrogenic. A stable mild compression deformities are present at T12 and L1. Schmorl's nodes are noted at multiple levels. Degenerative changes are present in the thoracolumbar spine. Stable scattered lytic lesions are present in the bones, which may be associated with patient's history of multiple myeloma. IMPRESSION: 1. Nonobstructive right renal calculi. 2. Hepatic steatosis. 3. Scattered lytic lesions in the  bones, compatible with known history of multiple myeloma. No acute osseous abnormality is seen. 4. Aortic atherosclerosis. Electronically Signed   By: Brett Fairy M.D.   On: 05/02/2022 03:33   US Venous Img Lower Bilateral (DVT)  Result Date: 04/13/2022 CLINICAL DATA:  Bilateral lower extremity pain EXAM: BILATERAL LOWER EXTREMITY VENOUS DOPPLER ULTRASOUND TECHNIQUE: Gray-scale  sonography with graded compression, as well as color Doppler and duplex ultrasound were performed to evaluate the lower extremity deep venous systems from the level of the common femoral vein and including the common femoral, femoral, profunda femoral, popliteal and calf veins including the posterior tibial, peroneal and gastrocnemius veins when visible. The superficial great saphenous vein was also interrogated. Spectral Doppler was utilized to evaluate flow at rest and with distal augmentation maneuvers in the common femoral, femoral and popliteal veins. COMPARISON:  None Available. FINDINGS: RIGHT LOWER EXTREMITY Common Femoral Vein: No evidence of thrombus. Normal compressibility, respiratory phasicity and response to augmentation. Saphenofemoral Junction: No evidence of thrombus. Normal compressibility and flow on color Doppler imaging. Profunda Femoral Vein: No evidence of thrombus. Normal compressibility and flow on color Doppler imaging. Femoral Vein: No evidence of thrombus. Normal compressibility, respiratory phasicity and response to augmentation. Popliteal Vein: No evidence of thrombus. Normal compressibility, respiratory phasicity and response to augmentation. Calf Veins: No evidence of thrombus. Normal compressibility and flow on color Doppler imaging. LEFT LOWER EXTREMITY Common Femoral Vein: No evidence of thrombus. Normal compressibility, respiratory phasicity and response to augmentation. Saphenofemoral Junction: No evidence of thrombus. Normal compressibility and flow on color Doppler imaging. Profunda Femoral Vein:  No evidence of thrombus. Normal compressibility and flow on color Doppler imaging. Femoral Vein: No evidence of thrombus. Normal compressibility, respiratory phasicity and response to augmentation. Popliteal Vein: No evidence of thrombus. Normal compressibility, respiratory phasicity and response to augmentation. Calf Veins: No evidence of thrombus. Normal compressibility and flow on color Doppler imaging. Other Findings:  None. IMPRESSION: No evidence of deep venous thrombosis in either lower extremity. Electronically Signed   By: Albin Felling M.D.   On: 04/13/2022 12:29   CT Angio Chest PE W and/or Wo Contrast  Result Date: 04/12/2022 CLINICAL DATA:  Shortness of breath and wheezing. EXAM: CT ANGIOGRAPHY CHEST WITH CONTRAST TECHNIQUE: Multidetector CT imaging of the chest was performed using the standard protocol during bolus administration of intravenous contrast. Multiplanar CT image reconstructions and MIPs were obtained to evaluate the vascular anatomy. RADIATION DOSE REDUCTION: This exam was performed according to the departmental dose-optimization program which includes automated exposure control, adjustment of the mA and/or kV according to patient size and/or use of iterative reconstruction technique. CONTRAST:  30m OMNIPAQUE IOHEXOL 350 MG/ML SOLN COMPARISON:  March 29, 2022 FINDINGS: Cardiovascular: A right-sided venous Port-A-Cath is in place. There is mild calcification of the descending thoracic aorta, without evidence of aortic aneurysm. The subsegmental pulmonary arteries are limited in evaluation secondary to suboptimal opacification with intravenous contrast. No evidence of pulmonary embolism. Normal heart size with marked severity coronary artery calcification. No pericardial effusion. Mediastinum/Nodes: No enlarged mediastinal, hilar, or axillary lymph nodes. Thyroid gland, trachea, and esophagus demonstrate no significant findings. Lungs/Pleura: Mild lingular and mild anteromedial  right upper lobe linear atelectasis is seen. There is no evidence of acute infiltrate, pleural effusion or pneumothorax. Upper Abdomen: There is diffuse fatty infiltration of the liver parenchyma. Musculoskeletal: A chronic compression fracture deformity is seen at the level of T6 vertebral body. Sclerotic changes are also seen within this region. Small lytic lesions are also seen at the level of T6 and T7. Review of the MIP images confirms the above findings. IMPRESSION: 1. No evidence of pulmonary embolism. 2. Mild lingular and mild anteromedial right upper lobe linear atelectasis. 3. Chronic compression fracture deformity at the level of T6 vertebral body. 4. Small lytic lesions at the level of T6 and T7. 5. Hepatic steatosis.  6. Aortic atherosclerosis. Aortic Atherosclerosis (ICD10-I70.0). Electronically Signed   By: Virgina Norfolk M.D.   On: 04/12/2022 19:59   DG Chest Port 1 View  Result Date: 04/12/2022 CLINICAL DATA:  Sepsis, short of breath, wheezing, anemia, multiple myeloma EXAM: PORTABLE CHEST 1 VIEW COMPARISON:  02/19/2022 FINDINGS: Single frontal view of the chest demonstrates stable right chest wall port. The cardiac silhouette is unremarkable. No airspace disease, effusion, or pneumothorax. No acute bony abnormality. IMPRESSION: 1. No acute intrathoracic process. Electronically Signed   By: Randa Ngo M.D.   On: 04/12/2022 15:26     ASSESSMENT:  1.  IgG lambda multiple myeloma, stage III, del 17 p: -4 cycles of RVD from 11/01/2017 through 01/14/2018. -Stem cell transplant on 02/20/2018 at [facility]. -Maintenance Velcade every 2 weeks and Revlimid 10 mg 3 weeks on/1 week off started on 07/29/2018. -Myeloma panel from 12/08/2018 shows M spike not observed.  Kappa light chains of 32.1 with ratio of 1.64.  Immunofixation was negative. 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  MRI of the brain on 12/27/2019 shows acute ischemic nonhemorrhagic left MCA territory infarct involving left parietal lobe,  corresponding with perfusion deficit on CT scan angiogram.  No associated hemorrhage or mass-effect.  Additional few scattered punctate acute ischemic nonhemorrhagic infarcts involving bilateral frontal and parietal lobes as well as left cerebellum. - Revlimid was on hold since June 2021 due to potential for contributing to CVA. - Velcade dose was reduced to 1 mg per metered square on 12/28/2020 due to worsening neuropathy in the feet.,  Pomalidomide 2 mg 3 weeks on/1 week off started around 09/20/2021, discontinued on 01/17/2022 due to progression. - Daratumumab, carfilzomib and dexamethasone (DKd) started on 01/25/2022, cycle 3 on 04/05/2022 progression after 3 cycles. - Selinexor 80 mg day 1, 3 weekly with dexamethasone 20 mg twice weekly started on 05/10/2022   2.  CVA with aphasia: -We held her myeloma treatments since CVA with aphasia on 12/26/2019. -Her aphasia is improving.   PLAN:  1.  IgG lambda multiple myeloma, stage III, del 17 p: - I have contacted Dr. Laverta Baltimore at [facility] for BiTE/CAR-T therapies. - He will discuss with his team and let us know. - In the interim we will start her on selinexor dexamethasone regimen. - We will start at selinexor 80 mg day 1/3 and dexamethasone 20 mg on the same days. - We discussed side effects of selinexor in detail. - Reviewed labs today which showed creatinine 1.14 and normal LFTs.  CBC shows hemoglobin is 7.2. - She will receive 1 unit PRBC. - Start selinexor and dexamethasone today.  RTC 1 week for follow-up.   2.  Hypomagnesemia: - Continue magnesium 3 times daily.  Magnesium was persistently low last few times.  She will receive magnesium today.   3.  Bone strengthening agents: - Continue Zometa every 4 weeks.   4.  Peripheral neuropathy: - Continue Lyrica 200 mg twice daily.   5.  ID prophylaxis: - Continue acyclovir twice daily.   6.  Intractable nausea/vomiting - She cannot take Zofran and Compazine per her cardiologist. -  Continue Reglan 10 mg every 6-8 hours as needed. - We have ordered MRI of the brain with and without contrast..   7.  Recurrent UTIs: - Continue Macrobid 100 mg daily for prophylaxis.   Orders placed this encounter:  No orders of the defined types were placed in this encounter.     Derek Jack, MD [place] [phone]

## 2022-05-10 NOTE — Patient Instructions (Signed)
[place] at [facility] Discharge Instructions   You were seen and examined today by Dr. Delton Coombes.  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 In order to qualify you for this you have to have been on 4 prior therapies. We sent a pill called Xpovio which you will take on day 1 and day 3 each week. You will take dexamethasone 5 tablets on these days.   We will transfuse 1 unit of blood today.   Return as scheduled.    Thank you for choosing [place] at [facility] to provide your oncology and hematology care.  To afford each patient quality time with our provider, please arrive at least 15 minutes before your scheduled appointment time.   If you have a lab appointment with the [facility] please come in thru the Main Entrance and check in at the main information desk.  You need to re-schedule your appointment should you arrive 10 or more minutes late.  We strive to give you quality time with our providers, and arriving late affects you and other patients whose appointments are after yours.  Also, if you no show three or more times for appointments you may be dismissed from the clinic at the providers discretion.     Again, thank you for choosing [facility].  Our hope is that these requests will decrease the amount of time that you wait before being seen by our physicians.       _____________________________________________________________  Should you have questions after your visit to [facility], please contact our office at [phone] and follow the prompts.  Our office hours are 8:00 a.m. and 4:30 p.m. Monday - Friday.  Please note that voicemails left after 4:00 p.m. may not be returned until the following business day.  We are closed weekends and major holidays.  You do have access to a nurse 24-7, just call the main number to the clinic [phone] and do not press any options, hold on the line  and a nurse will answer the phone.    For prescription refill requests, have your pharmacy contact our office and allow 72 hours.    Due to Covid, you will need to wear a mask upon entering the hospital. If you do not have a mask, a mask will be given to you at the Main Entrance upon arrival. For doctor visits, patients may have 1 support person age 36 or older with them. For treatment visits, patients can not have anyone with them due to social distancing guidelines and our immunocompromised population.

## 2022-05-11 LAB — BPAM RBC
Blood Product Expiration Date: 202311212359
ISSUE DATE / TIME: 202311021228
Unit Type and Rh: 6200

## 2022-05-14 ENCOUNTER — Ambulatory Visit (HOSPITAL_COMMUNITY): Payer: BC Managed Care – PPO

## 2022-05-14 DIAGNOSIS — C9000 Multiple myeloma not having achieved remission: Secondary | ICD-10-CM | POA: Diagnosis not present

## 2022-05-15 ENCOUNTER — Encounter: Payer: Self-pay | Admitting: Internal Medicine

## 2022-05-16 ENCOUNTER — Inpatient Hospital Stay: Payer: BC Managed Care – PPO

## 2022-05-16 VITALS — BP 120/86 | HR 70 | Temp 96.3°F | Resp 20

## 2022-05-16 DIAGNOSIS — C9000 Multiple myeloma not having achieved remission: Secondary | ICD-10-CM | POA: Diagnosis not present

## 2022-05-16 LAB — CBC WITH DIFFERENTIAL/PLATELET
Abs Immature Granulocytes: 0 10*3/uL (ref 0.00–0.07)
Basophils Absolute: 0 10*3/uL (ref 0.0–0.1)
Basophils Relative: 0 %
Eosinophils Absolute: 0 10*3/uL (ref 0.0–0.5)
Eosinophils Relative: 0 %
HCT: 24.8 % — ABNORMAL LOW (ref 36.0–46.0)
Hemoglobin: 8 g/dL — ABNORMAL LOW (ref 12.0–15.0)
Lymphocytes Relative: 18 %
Lymphs Abs: 1.3 10*3/uL (ref 0.7–4.0)
MCH: 30.5 pg (ref 26.0–34.0)
MCHC: 32.3 g/dL (ref 30.0–36.0)
MCV: 94.7 fL (ref 80.0–100.0)
Monocytes Absolute: 0.1 10*3/uL (ref 0.1–1.0)
Monocytes Relative: 1 %
Neutro Abs: 6 10*3/uL (ref 1.7–7.7)
Neutrophils Relative %: 81 %
Platelets: 144 10*3/uL — ABNORMAL LOW (ref 150–400)
RBC: 2.62 MIL/uL — ABNORMAL LOW (ref 3.87–5.11)
RDW: 15.7 % — ABNORMAL HIGH (ref 11.5–15.5)
WBC: 7.4 10*3/uL (ref 4.0–10.5)
nRBC: 3.1 % — ABNORMAL HIGH (ref 0.0–0.2)

## 2022-05-16 LAB — COMPREHENSIVE METABOLIC PANEL
ALT: 15 U/L (ref 0–44)
AST: 19 U/L (ref 15–41)
Albumin: 2.7 g/dL — ABNORMAL LOW (ref 3.5–5.0)
Alkaline Phosphatase: 115 U/L (ref 38–126)
Anion gap: 7 (ref 5–15)
BUN: 20 mg/dL (ref 6–20)
CO2: 24 mmol/L (ref 22–32)
Calcium: 8.4 mg/dL — ABNORMAL LOW (ref 8.9–10.3)
Chloride: 99 mmol/L (ref 98–111)
Creatinine, Ser: 0.91 mg/dL (ref 0.44–1.00)
GFR, Estimated: >60 mL/min (ref >60)
Glucose, Bld: 169 mg/dL — ABNORMAL HIGH (ref 70–99)
Potassium: 4.3 mmol/L (ref 3.5–5.1)
Sodium: 130 mmol/L — ABNORMAL LOW (ref 135–145)
Total Bilirubin: 0.5 mg/dL (ref 0.3–1.2)
Total Protein: 6 g/dL — ABNORMAL LOW (ref 6.5–8.1)

## 2022-05-16 LAB — SAMPLE TO BLOOD BANK

## 2022-05-16 LAB — MAGNESIUM: Magnesium: 2.2 mg/dL (ref 1.7–2.4)

## 2022-05-16 MED ORDER — SODIUM CHLORIDE 0.9% FLUSH
10.0000 mL | Freq: Once | INTRAVENOUS | Status: AC
  Administered 2022-05-16: 10 mL via INTRAVENOUS

## 2022-05-16 MED ORDER — HEPARIN SOD (PORK) LOCK FLUSH 100 UNIT/ML IV SOLN
500.0000 [IU] | Freq: Once | INTRAVENOUS | Status: AC
  Administered 2022-05-16: 500 [IU] via INTRAVENOUS

## 2022-05-16 NOTE — Progress Notes (Signed)
Patients port flushed without difficulty.  Good blood return noted with no bruising or swelling noted at site.  Stable during access and blood draw.  Patient to remain accessed for possible blood transfusion on 05/17/22.

## 2022-05-16 NOTE — Patient Instructions (Signed)
[place]  Discharge Instructions: Thank you for choosing [place] to provide your oncology and hematology care.  If you have a lab appointment with the [place], please come in thru the Main Entrance and check in at the main information desk.  Wear comfortable clothing and clothing appropriate for easy access to any Portacath or PICC line.   We strive to give you quality time with your provider. You may need to reschedule your appointment if you arrive late (15 or more minutes).  Arriving late affects you and other patients whose appointments are after yours.  Also, if you miss three or more appointments without notifying the office, you may be dismissed from the clinic at the provider's discretion.      For prescription refill requests, have your pharmacy contact our office and allow 72 hours for refills to be completed.    Today you received the following chemotherapy and/or immunotherapy agents port flush labs      To help prevent nausea and vomiting after your treatment, we encourage you to take your nausea medication as directed.  BELOW ARE SYMPTOMS THAT SHOULD BE REPORTED IMMEDIATELY: *FEVER GREATER THAN 100.4 F (38 C) OR HIGHER *CHILLS OR SWEATING *NAUSEA AND VOMITING THAT IS NOT CONTROLLED WITH YOUR NAUSEA MEDICATION *UNUSUAL SHORTNESS OF BREATH *UNUSUAL BRUISING OR BLEEDING *URINARY PROBLEMS (pain or burning when urinating, or frequent urination) *BOWEL PROBLEMS (unusual diarrhea, constipation, pain near the anus) TENDERNESS IN MOUTH AND THROAT WITH OR WITHOUT PRESENCE OF ULCERS (sore throat, sores in mouth, or a toothache) UNUSUAL RASH, SWELLING OR PAIN  UNUSUAL VAGINAL DISCHARGE OR ITCHING   Items with * indicate a potential emergency and should be followed up as soon as possible or go to the Emergency Department if any problems should occur.  Please show the CHEMOTHERAPY ALERT CARD or IMMUNOTHERAPY ALERT CARD at check-in to the  Emergency Department and triage nurse.  Should you have questions after your visit or need to cancel or reschedule your appointment, please contact [facility] [phone]  and follow the prompts.  Office hours are 8:00 a.m. to 4:30 p.m. Monday - Friday. Please note that voicemails left after 4:00 p.m. may not be returned until the following business day.  We are closed weekends and major holidays. You have access to a nurse at all times for urgent questions. Please call the main number to the clinic [phone] and follow the prompts.  For any non-urgent questions, you may also contact your provider using MyChart. We now offer e-Visits for anyone 51 and older to request care online for non-urgent symptoms. For details visit [email or web address].   Also download the MyChart app! Go to the app store, search "MyChart", open the app, select [place], and log in with your MyChart username and password.  Masks are optional in the cancer centers. If you would like for your care team to wear a mask while they are taking care of you, please let them know. You may have one support person who is at least 57 years old accompany you for your appointments.

## 2022-05-17 ENCOUNTER — Other Ambulatory Visit: Payer: BC Managed Care – PPO

## 2022-05-17 ENCOUNTER — Inpatient Hospital Stay (HOSPITAL_BASED_OUTPATIENT_CLINIC_OR_DEPARTMENT_OTHER): Payer: BC Managed Care – PPO | Admitting: Hematology

## 2022-05-17 VITALS — BP 100/42 | HR 73 | Temp 97.3°F | Resp 20 | Ht 64.0 in | Wt 241.3 lb

## 2022-05-17 DIAGNOSIS — C9000 Multiple myeloma not having achieved remission: Secondary | ICD-10-CM | POA: Diagnosis not present

## 2022-05-17 DIAGNOSIS — C9002 Multiple myeloma in relapse: Secondary | ICD-10-CM

## 2022-05-17 MED ORDER — HEPARIN SOD (PORK) LOCK FLUSH 100 UNIT/ML IV SOLN
500.0000 [IU] | Freq: Once | INTRAVENOUS | Status: AC
  Administered 2022-05-17: 500 [IU] via INTRAVENOUS

## 2022-05-17 NOTE — Patient Instructions (Signed)
[place] at [facility] Discharge Instructions   You were seen and examined today by Dr. Delton Coombes.  He reviewed the results of your lab work which are stable or improved.   Continue the pill and steroid every Saturday and Monday.   We will continue to check lab work weekly and give blood as needed.  We will see you back in 3 weeks.    Thank you for choosing [place] at [facility] to provide your oncology and hematology care.  To afford each patient quality time with our provider, please arrive at least 15 minutes before your scheduled appointment time.   If you have a lab appointment with the [place] please come in thru the Main Entrance and check in at the main information desk.  You need to re-schedule your appointment should you arrive 10 or more minutes late.  We strive to give you quality time with our providers, and arriving late affects you and other patients whose appointments are after yours.  Also, if you no show three or more times for appointments you may be dismissed from the clinic at the providers discretion.     Again, thank you for choosing [facility].  Our hope is that these requests will decrease the amount of time that you wait before being seen by our physicians.       _____________________________________________________________  Should you have questions after your visit to [facility], please contact our office at [phone] and follow the prompts.  Our office hours are 8:00 a.m. and 4:30 p.m. Monday - Friday.  Please note that voicemails left after 4:00 p.m. may not be returned until the following business day.  We are closed weekends and major holidays.  You do have access to a nurse 24-7, just call the main number to the clinic [phone] and do not press any options, hold on the line and a nurse will answer the phone.    For prescription refill requests, have your pharmacy  contact our office and allow 72 hours.    Due to Covid, you will need to wear a mask upon entering the hospital. If you do not have a mask, a mask will be given to you at the Main Entrance upon arrival. For doctor visits, patients may have 1 support person age 57 or older with them. For treatment visits, patients can not have anyone with them due to social distancing guidelines and our immunocompromised population.

## 2022-05-17 NOTE — Progress Notes (Signed)
Port de accessed and flushed with saline, followed by 500 units heparin.  Flushed easily and site CDI.  Band-Aid placed with no bleeding at site.  Discharged in stable condition via wheelchair with husband at side.

## 2022-05-17 NOTE — Progress Notes (Signed)
Joanna Reid, [place] [id number]   CLINIC:  Medical Oncology/Hematology  PCP:  Lindell Spar, MD [address] / [place] [address] [phone]   REASON FOR VISIT:  Follow-up for multiple myeloma  PRIOR THERAPY:  1. RVD x 4 cycles from 11/01/2017 through 01/14/2018. 2. Stem cell transplant on 02/20/2018. 3. Velcade from 06/11/2018 to 12/22/2019.  NGS Results: not done  CURRENT THERAPY: Daratumumab, carfilzomib and dexamethasone  BRIEF ONCOLOGIC HISTORY:  Oncology History  Multiple myeloma ([facility])  10/29/2017 Initial Diagnosis   Multiple myeloma ([facility])   11/01/2017 - 01/24/2018 Chemotherapy   The patient had dexamethasone (DECADRON) 4 MG tablet, 1 of 1 cycle, Start date: --, End date: -- lenalidomide (REVLIMID) 25 MG capsule, 1 of 1 cycle, Start date: --, End date: -- bortezomib SQ (VELCADE) chemo injection 3 mg, 1.3 mg/m2 = 3 mg, Subcutaneous,  Once, 5 of 5 cycles Administration: 3 mg (11/01/2017), 3 mg (11/08/2017), 3 mg (11/05/2017), 3 mg (11/22/2017), 3 mg (11/12/2017), 3 mg (11/29/2017), 3 mg (11/26/2017), 3 mg (12/03/2017), 3 mg (12/13/2017), 3 mg (12/20/2017), 3 mg (12/17/2017), 3 mg (12/24/2017), 3 mg (01/03/2018), 3 mg (01/10/2018), 3 mg (01/07/2018), 3 mg (01/14/2018), 3 mg (01/24/2018)  for chemotherapy treatment.    06/11/2018 - 12/20/2021 Chemotherapy   Patient is on Treatment Plan : MYELOMA MAINTENANCE Bortezomib SQ q 7d x 6 weeks, two weeks off then q 14d     01/25/2022 - 02/08/2022 Chemotherapy   Patient is on Treatment Plan : MYELOMA RELAPSED/REFRACTORY Carfilzomib (20/70) + Daratumumab SQ + Dexamethasone (20/40) DaraKd q28d     01/25/2022 - 04/05/2022 Chemotherapy   Patient is on Treatment Plan : MYELOMA RELAPSED/REFRACTORY Carfilzomib (20/70) + Daratumumab SQ + Dexamethasone (20/40) DaraKd q28d       CANCER STAGING:  Cancer Staging  Multiple myeloma ([facility]) Staging form: Plasma Cell Myeloma and Plasma Cell Disorders, AJCC 8th Edition -  Clinical: No stage assigned - Unsigned - Clinical: No stage assigned - Unsigned   INTERVAL HISTORY:  [name], a 56 y.o. female, seen for follow-up of multiple myeloma.  She started selinexor with dexamethasone and took them on 05/12/2022 and 05/14/2022.  Overall she felt better.  However she experienced nausea for the last couple of days and thinks they are coming from the foods she ate.  Back pain has been slightly prominent.  REVIEW OF SYSTEMS:  Review of Systems  Constitutional:  Negative for appetite change and fatigue.  Respiratory:  Positive for shortness of breath.   Gastrointestinal:  Positive for nausea. Negative for diarrhea.  Musculoskeletal:  Positive for back pain.  All other systems reviewed and are negative.   PAST MEDICAL/SURGICAL HISTORY:  Past Medical History:  Diagnosis Date   Acid reflux    Allergic rhinitis    Cancer ([facility])    multiple myeloma   Diabetes mellitus    type 2   Gout    Gout    H/o COVID-19--- was Positive 09/17/2019, Negative 12/23/19 AND also Neg on  12/26/19 12/26/2019   HBP (high blood pressure)    History of kidney stones    Migraines    Multiple myeloma ([facility]) 10/29/2017   Past Surgical History:  Procedure Laterality Date   BREAST CYST EXCISION Left    2009 no visible scar on skin   CESAREAN SECTION     COLONOSCOPY WITH PROPOFOL N/A 12/25/2019   Procedure: COLONOSCOPY WITH PROPOFOL;  Surgeon: Rogene Houston, MD;  Location: [facility]  ENDO SUITE;  Service: Endoscopy;  Laterality: N/A;  730   EXTRACORPOREAL SHOCK WAVE LITHOTRIPSY Left 10/10/2017   Procedure: LEFT EXTRACORPOREAL SHOCK WAVE LITHOTRIPSY (ESWL);  Surgeon: Bjorn Loser, MD;  Location: [facility] ORS;  Service: Urology;  Laterality: Left;   EYE SURGERY     HEMORRHOID SURGERY N/A 11/19/2012   Procedure: HEMORRHOIDECTOMY;  Surgeon: Jamesetta So, MD;  Location: [facility] ORS;  Service: General;  Laterality: N/A;   IR IMAGING GUIDED PORT INSERTION  01/18/2022   kidney stones  1998    LAPAROSCOPIC UNILATERAL SALPINGO OOPHERECTOMY  05/14/2012   Procedure: LAPAROSCOPIC UNILATERAL SALPINGO OOPHORECTOMY;  Surgeon: [name], MD;  Location: [facility] ORS;  Service: Gynecology;  Laterality: Right;  laparoscopic right salpingo-oophorectomy   PARTIAL HYSTERECTOMY     POLYPECTOMY  12/25/2019   Procedure: POLYPECTOMY;  Surgeon: Rogene Houston, MD;  Location: [facility];  Service: Endoscopy;;   TONSILECTOMY, ADENOIDECTOMY, BILATERAL MYRINGOTOMY AND TUBES     VESICOVAGINAL FISTULA CLOSURE W/ TAH      SOCIAL HISTORY:  Social History   Socioeconomic History   Marital status: Married    Spouse name: [name]   Number of children: 1   Years of education: 12   Highest education level: Some college, no degree  Occupational History    Employer: [employer]  Tobacco Use   Smoking status: Former    Types: Cigarettes    Quit date: 02/27/1999    Years since quitting: 23.2   Smokeless tobacco: Never   Tobacco comments:    socially   Vaping Use   Vaping Use: Never used  Substance and Sexual Activity   Alcohol use: No    Alcohol/week: 0.0 standard drinks of alcohol   Drug use: No   Sexual activity: Yes    Birth control/protection: Surgical    Comment: hyst  Other Topics Concern   Not on file  Social History Narrative   Not on file   Social Determinants of Health   Financial Resource Strain: Low Risk  (08/08/2021)   Overall Financial Resource Strain (CARDIA)    Difficulty of Paying Living Expenses: Not hard at all  Food Insecurity: No Food Insecurity (04/13/2022)   Hunger Vital Sign    Worried About Running Out of Food in the Last Year: Never true    Ran Out of Food in the Last Year: Never true  Transportation Needs: No Transportation Needs (04/13/2022)   PRAPARE - Hydrologist (Medical): No    Lack of Transportation (Non-Medical): No  Physical Activity: Unknown (08/08/2021)   Exercise Vital Sign    Days of Exercise per Week: Not on file    Minutes of  Exercise per Session: 10 min  Stress: No Stress Concern Present (08/08/2021)   [place]    Feeling of Stress : Not at all  Social Connections: [place] (08/08/2021)   Social Connection and Isolation Panel [NHANES]    Frequency of Communication with Friends and Family: More than three times a week    Frequency of Social Gatherings with Friends and Family: Twice a week    Attends Religious Services: More than 4 times per year    Active Member of [employer] or Organizations: Yes    Attends Archivist Meetings: More than 4 times per year    Marital Status: Married  Human resources officer Violence: Not At Risk (04/13/2022)   Humiliation, Afraid, Rape, and Kick questionnaire    Fear  of Current or Ex-Partner: No    Emotionally Abused: No    Physically Abused: No    Sexually Abused: No    FAMILY HISTORY:  Family History  Problem Relation Age of Onset   Arthritis Other    Cancer Other    Diabetes Other    Hypertension Mother    Dementia Mother    Diabetes Father    ALS Father    Diabetes Brother    Hypertension Brother    Cancer Paternal Aunt    COPD Maternal Grandmother    Cancer Maternal Grandfather    Anesthesia problems Paternal Grandfather     CURRENT MEDICATIONS:  Current Outpatient Medications  Medication Sig Dispense Refill   acyclovir (ZOVIRAX) 400 MG tablet TAKE (1) TABLET BY MOUTH TWICE DAILY. 60 tablet 6   albuterol (VENTOLIN HFA) 108 (90 Base) MCG/ACT inhaler Inhale 2 puffs into the lungs every 4 (four) hours as needed for wheezing or shortness of breath. 18 g 3   allopurinol (ZYLOPRIM) 300 MG tablet TAKE (1) TABLET BY MOUTH ONCE DAILY. 90 tablet 0   amLODipine (NORVASC) 2.5 MG tablet Take 2.5 mg by mouth daily.     aspirin EC 81 MG tablet Take 1 tablet (81 mg total) by mouth daily with breakfast. For stroke prophylaxis 30 tablet 11   BD PEN NEEDLE NANO U/F 32G X 4 MM MISC Inject into the  skin.     bumetanide (BUMEX) 0.5 MG tablet Take 0.5 mg by mouth daily.      carvedilol (COREG) 25 MG tablet Take 1 tablet (25 mg total) by mouth 2 (two) times daily.     DARZALEX FASPRO 1800-30000 MG-UT/15ML SOLN Inject 15 ml over 3-5 minutes subcutaneously once every 2 weeks. 120 mL 0   dexamethasone (DECADRON) 4 MG tablet Take 5 tablets (20 mg total) by mouth 2 (two) times a week. Take on day 1 and day 3 along with Xpovio. 40 tablet 3   diclofenac Sodium (VOLTAREN) 1 % GEL Apply 2 g topically 4 (four) times daily. 100 g 0   docusate sodium (COLACE) 100 MG capsule Take 100 mg by mouth daily.      Dulaglutide 1.5 MG/0.5ML SOPN Inject 1.5 mg into the skin every 7 (seven) days.     escitalopram (LEXAPRO) 10 MG tablet TAKE (1) TABLET BY MOUTH ONCE DAILY. MAY START WITH 1/2 TABLET FOR 7 DAYS. 90 tablet 0   fluticasone (FLONASE) 50 MCG/ACT nasal spray INSTILL 2 SPRAYS INTO BOTH NOSTRILS DAILY 16 g 4   Insulin Lispro w/ Trans Port 100 UNIT/ML SOPN Inject 20 Units into the skin See admin instructions. Inject before meals per sliding scale Max TDD 20 units     KYPROLIS 30 MG SOLR Inject into the vein.     Lactulose 20 GM/30ML SOLN Take 30 mLs (20 g total) by mouth daily as needed. 473 mL 2   lidocaine-prilocaine (EMLA) cream Apply 1 Application topically as needed (Apply to port prior to treatment and flushes). 30 g 6   LORazepam (ATIVAN) 0.5 MG tablet TAKE (1) TABLET BY MOUTH EVERY EIGHT HOURS 30 tablet 2   losartan (COZAAR) 100 MG tablet Take 100 mg by mouth daily.     magnesium oxide (MAG-OX) 400 (241.3 Mg) MG tablet Take 1 tablet (400 mg total) by mouth 3 (three) times daily. 90 tablet 3   metFORMIN (GLUCOPHAGE) 500 MG tablet Take 1 tablet (500 mg total) by mouth 2 (two) times daily. 60 tablet 4   metoCLOPramide (  REGLAN) 10 MG tablet Take 1 tablet (10 mg total) by mouth every 8 (eight) hours as needed for nausea. 90 tablet 3   nitrofurantoin, macrocrystal-monohydrate, (MACROBID) 100 MG capsule TAKE  (1) CAPSULE BY MOUTH AT BEDTIME. (Patient taking differently: Take 100 mg by mouth at bedtime.) 90 capsule 0   NOVOLOG FLEXPEN 100 UNIT/ML FlexPen Inject into the skin.     oxyCODONE-acetaminophen (PERCOCET/ROXICET) 5-325 MG tablet Take 1 tablet by mouth 2 (two) times daily as needed for severe pain. 30 tablet 0   pantoprazole (PROTONIX) 40 MG tablet TAKE (1) TABLET BY MOUTH ONCE DAILY. (Patient taking differently: Take 40 mg by mouth daily.) 90 tablet 0   polyethylene glycol (MIRALAX / GLYCOLAX) 17 g packet Take 17 g by mouth daily as needed for mild constipation. 14 each 0   pregabalin (LYRICA) 200 MG capsule TAKE (1) CAPSULE BY MOUTH TWICE DAILY. (Patient taking differently: Take 200 mg by mouth 2 (two) times daily.) 60 capsule 11   Propylene Glycol (SYSTANE BALANCE) 0.6 % SOLN Apply 1 drop to eye daily as needed (dry eye).      rosuvastatin (CRESTOR) 5 MG tablet Take 1 tablet (5 mg total) by mouth daily. 90 tablet 3   scopolamine (TRANSDERM-SCOP) 1 MG/3DAYS Place 1 patch (1.5 mg total) onto the skin every 3 (three) days. 10 patch 12   selinexor (XPOVIO) Therapy Pack (80 mg twice weekly) Take 4 tablets (80 mg total) by mouth 2 (two) times a week. Take on days 1 and 3 of each week. 32 tablet 3   traMADol (ULTRAM) 50 MG tablet Take 1 tablet (50 mg total) by mouth every 8 (eight) hours as needed for severe pain. 30 tablet 0   valACYclovir (VALTREX) 1000 MG tablet Take 1 tablet (1,000 mg total) by mouth See admin instructions. TAKE 2 TABLETS BY MOUTH NOW; THEN 2 tab about 12 HOURS LATER--- for outbreaks 30 tablet 0   No current facility-administered medications for this visit.    ALLERGIES:  No Known Allergies  PHYSICAL EXAM:  Performance status (ECOG): 1 - Symptomatic but completely ambulatory  Vitals:   05/17/22 0959  BP: (!) 100/42  Pulse: 73  Resp: 20  Temp: (!) 97.3 F (36.3 C)  SpO2: 98%   Wt Readings from Last 3 Encounters:  05/17/22 241 lb 4.8 oz (109.5 kg)  05/10/22 242 lb  (109.8 kg)  05/09/22 239 lb 12.8 oz (108.8 kg)   Physical Exam Vitals reviewed.  Constitutional:      Appearance: Normal appearance. She is obese.  Cardiovascular:     Rate and Rhythm: Normal rate and regular rhythm.     Pulses: Normal pulses.     Heart sounds: Normal heart sounds.  Pulmonary:     Effort: Pulmonary effort is normal.     Breath sounds: Normal breath sounds.  Neurological:     General: No focal deficit present.     Mental Status: She is alert and oriented to person, place, and time.  Psychiatric:        Mood and Affect: Mood normal.        Behavior: Behavior normal.     LABORATORY DATA:  I have reviewed the labs as listed.     Latest Ref Rng & Units 05/16/2022   11:21 AM 05/09/2022    1:15 PM 05/02/2022   10:28 AM  CBC  WBC 4.0 - 10.5 K/uL 7.4  4.4  5.3   Hemoglobin 12.0 - 15.0 g/dL 8.0  7.2  7.1  Hematocrit 36.0 - 46.0 % 24.8  22.9  22.4   Platelets 150 - 400 K/uL 144  212  138       Latest Ref Rng & Units 05/16/2022   11:21 AM 05/09/2022    1:15 PM 05/02/2022   10:28 AM  CMP  Glucose 70 - 99 mg/dL 169  131  253   BUN 6 - 20 mg/dL _0 Creatinine 0.44 - 1.00 mg/dL 0.91  1.14  1.13   Sodium 135 - 145 mmol/L 130  136  135   Potassium 3.5 - 5.1 mmol/L 4.3  3.4  3.8   Chloride 98 - 111 mmol/L 99  99  99   CO2 22 - 32 mmol/L _1 Calcium 8.9 - 10.3 mg/dL 8.4  8.5  8.4   Total Protein 6.5 - 8.1 g/dL 6.0  6.0  6.3   Total Bilirubin 0.3 - 1.2 mg/dL 0.5  0.9  1.0   Alkaline Phos 38 - 126 U/L 115  168  120   AST 15 - 41 U/L _2 ALT 0 - 44 U/L _3 DIAGNOSTIC IMAGING:  I have independently reviewed the scans and discussed with the patient. CT Abdomen Pelvis Wo Contrast  Result Date: 05/02/2022 CLINICAL DATA:  Flank pain, kidney stone suspected. Right lower quadrant pain with recurrent nephrolithiasis. EXAM: CT ABDOMEN AND PELVIS WITHOUT CONTRAST TECHNIQUE: Multidetector CT imaging of the abdomen and pelvis was  performed following the standard protocol without IV contrast. RADIATION DOSE REDUCTION: This exam was performed according to the departmental dose-optimization program which includes automated exposure control, adjustment of the mA and/or kV according to patient size and/or use of iterative reconstruction technique. COMPARISON:  05/01/2021, 9920.2. FINDINGS: Lower chest: No acute abnormality. Hepatobiliary: No focal liver abnormality is seen. Fatty infiltration of the liver is noted. No gallstones, gallbladder wall thickening, or biliary dilatation. Pancreas: Unremarkable. No pancreatic ductal dilatation or surrounding inflammatory changes. Spleen: Normal in size without focal abnormality. Adrenals/Urinary Tract: No adrenal nodule or mass. Nonobstructive calculi are present in the right kidney. No hydronephrosis bilaterally. Bladder is unremarkable. Stomach/Bowel: Stomach is within normal limits. Appendix appears normal. No bowel obstruction, free air, or pneumatosis. There is fatty infiltration of the walls of the ascending colon, compatible with chronic inflammatory changes. No acute inflammatory changes or bowel wall thickening. Vascular/Lymphatic: Aortic atherosclerosis. No enlarged abdominal or pelvic lymph nodes. Reproductive: Status post hysterectomy. No adnexal masses. Other: No abdominopelvic ascites. Musculoskeletal: A tiny focus of air is seen in the subcutaneous tissues in the anterior abdominal wall to the right of midline which may be iatrogenic. A stable mild compression deformities are present at T12 and L1. Schmorl's nodes are noted at multiple levels. Degenerative changes are present in the thoracolumbar spine. Stable scattered lytic lesions are present in the bones, which may be associated with patient's history of multiple myeloma. IMPRESSION: 1. Nonobstructive right renal calculi. 2. Hepatic steatosis. 3. Scattered lytic lesions in the bones, compatible with known history of multiple myeloma.  No acute osseous abnormality is seen. 4. Aortic atherosclerosis. Electronically Signed   By: Brett Fairy M.D.   On: 05/02/2022 03:33     ASSESSMENT:  1.  IgG lambda multiple myeloma, stage III, del 17 p: -4 cycles of RVD from 11/01/2017 through 01/14/2018. -Stem cell [facility] on 02/20/2018 at [facility]. -Maintenance Velcade every 2 weeks and Revlimid 10  mg 3 weeks on/1 week off started on 07/29/2018. -Myeloma panel from 12/08/2018 shows M spike not observed.  Kappa light chains of 32.1 with ratio of 1.64.  Immunofixation was negative. 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  MRI of the brain on 12/27/2019 shows acute ischemic nonhemorrhagic left MCA territory infarct involving left parietal lobe, corresponding with perfusion deficit on CT scan angiogram.  No associated hemorrhage or mass-effect.  Additional few scattered punctate acute ischemic nonhemorrhagic infarcts involving bilateral frontal and parietal lobes as well as left cerebellum. - Revlimid was on hold since June 2021 due to potential for contributing to CVA. - Velcade dose was reduced to 1 mg per metered square on 12/28/2020 due to worsening neuropathy in the feet.,  Pomalidomide 2 mg 3 weeks on/1 week off started around 09/20/2021, discontinued on 01/17/2022 due to progression. - Daratumumab, carfilzomib and dexamethasone (DKd) started on 01/25/2022, cycle 3 on 04/05/2022 progression after 3 cycles. - I have contacted Dr. Laverta Baltimore at [facility] for BiTE/CAR-T therapies. - Selinexor 80 mg day 1, 3 weekly with dexamethasone 20 mg twice weekly started on 05/12/2022   2.  CVA with aphasia: -We held her myeloma treatments since CVA with aphasia on 12/26/2019. -Her aphasia is improving.   PLAN:  1.  IgG lambda multiple myeloma, stage III, del 17 p: - She started selinexor and dexamethasone on 05/12/2022 and took day 3 on 05/14/2022. - She felt nauseous in the last 2 days.  She thinks it is from the food she ate. - Back pain is slightly worse.  I have  reviewed recent CT [facility] without contrast from 04/30/2022 which showed nonobstructive renal calculi.  Hepatic steatosis.  Scattered lytic lesions in the bones with no acute osseous abnormality. - Reviewed labs today which showed normal LFTs.  Creatinine has improved to normal.  CBC shows hemoglobin is 8 and platelet count 144. - We will continue checking CBC with blood bank sample weekly for possible transfusion. - Continue selinexor on day 1 and day 3 weekly along with dexamethasone. - RTC 3 weeks with SPEP, free light chains, CBC and CMP. - Systolic blood pressure 364.  Hold losartan and amlodipine.  Continue Coreg.   2.  Hypomagnesemia: - Continue magnesium 3 times daily.  Magnesium is normal.   3.  Bone strengthening agents: - Continue Zometa every 4 weeks.   4.  Peripheral neuropathy: - Continue Lyrica 200 mg twice daily.   5.  ID prophylaxis: - Continue acyclovir twice daily.   6.  Intractable nausea/vomiting - Continue Reglan 10 mg every 6-8 hours as needed.  It is helping.   7.  Recurrent UTIs: - Continue Macrobid 100 mg daily for prophylaxis.   Orders placed this encounter:  No orders of the defined types were placed in this encounter.     Derek Jack, MD [place] [phone]

## 2022-05-18 ENCOUNTER — Ambulatory Visit: Payer: BC Managed Care – PPO

## 2022-05-18 ENCOUNTER — Other Ambulatory Visit: Payer: BC Managed Care – PPO

## 2022-05-22 ENCOUNTER — Telehealth: Payer: Self-pay | Admitting: Orthopaedic Surgery

## 2022-05-22 ENCOUNTER — Ambulatory Visit: Payer: BC Managed Care – PPO | Admitting: Orthopaedic Surgery

## 2022-05-22 NOTE — Telephone Encounter (Signed)
Patient's daughter called this morning to cx her appt, stated that she has not started therapy yet.  They will call back to rs once she starts therapy.

## 2022-05-24 ENCOUNTER — Inpatient Hospital Stay: Payer: BC Managed Care – PPO

## 2022-05-24 DIAGNOSIS — C9000 Multiple myeloma not having achieved remission: Secondary | ICD-10-CM | POA: Diagnosis not present

## 2022-05-24 LAB — CBC WITH DIFFERENTIAL/PLATELET
Abs Immature Granulocytes: 0.03 10*3/uL (ref 0.00–0.07)
Basophils Absolute: 0 10*3/uL (ref 0.0–0.1)
Basophils Relative: 0 %
Eosinophils Absolute: 0 10*3/uL (ref 0.0–0.5)
Eosinophils Relative: 0 %
HCT: 23.7 % — ABNORMAL LOW (ref 36.0–46.0)
Hemoglobin: 7.9 g/dL — ABNORMAL LOW (ref 12.0–15.0)
Immature Granulocytes: 1 %
Lymphocytes Relative: 21 %
Lymphs Abs: 0.9 10*3/uL (ref 0.7–4.0)
MCH: 30.7 pg (ref 26.0–34.0)
MCHC: 33.3 g/dL (ref 30.0–36.0)
MCV: 92.2 fL (ref 80.0–100.0)
Monocytes Absolute: 0.4 10*3/uL (ref 0.1–1.0)
Monocytes Relative: 8 %
Neutro Abs: 3.1 10*3/uL (ref 1.7–7.7)
Neutrophils Relative %: 70 %
Platelets: 107 10*3/uL — ABNORMAL LOW (ref 150–400)
RBC: 2.57 MIL/uL — ABNORMAL LOW (ref 3.87–5.11)
RDW: 15.6 % — ABNORMAL HIGH (ref 11.5–15.5)
WBC: 4.4 10*3/uL (ref 4.0–10.5)
nRBC: 1.1 % — ABNORMAL HIGH (ref 0.0–0.2)

## 2022-05-24 LAB — SAMPLE TO BLOOD BANK

## 2022-05-24 LAB — PREPARE RBC (CROSSMATCH)

## 2022-05-24 MED ORDER — HEPARIN SOD (PORK) LOCK FLUSH 100 UNIT/ML IV SOLN
500.0000 [IU] | Freq: Once | INTRAVENOUS | Status: AC
  Administered 2022-05-24: 500 [IU] via INTRAVENOUS

## 2022-05-24 MED ORDER — SODIUM CHLORIDE 0.9% FLUSH
10.0000 mL | INTRAVENOUS | Status: DC | PRN
  Administered 2022-05-24: 10 mL via INTRAVENOUS

## 2022-05-24 NOTE — Addendum Note (Signed)
Addended by: Benjiman Core D on: 05/24/2022 01:17 PM   Modules accepted: Orders

## 2022-05-24 NOTE — Progress Notes (Signed)
Patients port flushed without difficulty.  Good blood return noted with no bruising or swelling noted at site.  Band aid applied.  VSS with discharge and left in satisfactory condition with no s/s of distress noted.   

## 2022-05-25 ENCOUNTER — Inpatient Hospital Stay: Payer: BC Managed Care – PPO

## 2022-05-25 VITALS — BP 101/64 | HR 62 | Temp 97.1°F | Resp 20

## 2022-05-25 DIAGNOSIS — C9000 Multiple myeloma not having achieved remission: Secondary | ICD-10-CM | POA: Diagnosis not present

## 2022-05-25 DIAGNOSIS — I959 Hypotension, unspecified: Secondary | ICD-10-CM

## 2022-05-25 DIAGNOSIS — Z95828 Presence of other vascular implants and grafts: Secondary | ICD-10-CM

## 2022-05-25 MED ORDER — SODIUM CHLORIDE 0.9% FLUSH
10.0000 mL | Freq: Once | INTRAVENOUS | Status: AC
  Administered 2022-05-25: 10 mL via INTRAVENOUS

## 2022-05-25 MED ORDER — SODIUM CHLORIDE 0.9 % IV SOLN: INTRAVENOUS | Status: DC

## 2022-05-25 MED ORDER — SODIUM CHLORIDE 0.9% IV SOLUTION
250.0000 mL | Freq: Once | INTRAVENOUS | Status: AC
  Administered 2022-05-25: 250 mL via INTRAVENOUS

## 2022-05-25 MED ORDER — SODIUM CHLORIDE 0.9% FLUSH
10.0000 mL | INTRAVENOUS | Status: AC | PRN
  Administered 2022-05-25: 10 mL

## 2022-05-25 MED ORDER — HEPARIN SOD (PORK) LOCK FLUSH 100 UNIT/ML IV SOLN
500.0000 [IU] | Freq: Every day | INTRAVENOUS | Status: AC | PRN
  Administered 2022-05-25: 500 [IU]

## 2022-05-25 NOTE — Progress Notes (Signed)
Patient presents today for blood transfusion. Patient reports taking tylenol and benadryl this morning around 7:00 am. BP at start of blood transfusion 79/54, after 15 min 86/60, Tarri Abernethy made aware and advised to give 539m of normal saline after blood transfusion and patient instructed to follow up with cardiologist.  Patient tolerated therapy with no complaints voiced. Side effects with management reviewed with understanding verbalized. Port site clean and dry with no bruising or swelling noted at site. Good blood return noted before and after administration of therapy. Band aid applied. Patient left in satisfactory condition with VSS and no s/s of distress noted.

## 2022-05-25 NOTE — Patient Instructions (Signed)
[facility]  Discharge Instructions: Thank you for choosing [facility] to provide your oncology and hematology care.  If you have a lab appointment with the [facility], please come in thru the Main Entrance and check in at the main information desk.  Wear comfortable clothing and clothing appropriate for easy access to any Portacath or PICC line.   We strive to give you quality time with your provider. You may need to reschedule your appointment if you arrive late (15 or more minutes).  Arriving late affects you and other patients whose appointments are after yours.  Also, if you miss three or more appointments without notifying the office, you may be dismissed from the clinic at the provider's discretion.      For prescription refill requests, have your pharmacy contact our office and allow 72 hours for refills to be completed.    Today you received the following 1 unit of PRBCs. BP today was 92/57, 79/54 and 86/60. Blood pressures in the past have been 101/67, 100/42, 114/84. Per Tarri Abernethy, PA give 557m of normal saline, stop Losartan at home and follow-up with your Cardiologist. Return as scheduled.   To help prevent nausea and vomiting after your treatment, we encourage you to take your nausea medication as directed.  BELOW ARE SYMPTOMS THAT SHOULD BE REPORTED IMMEDIATELY: *FEVER GREATER THAN 100.4 F (38 C) OR HIGHER *CHILLS OR SWEATING *NAUSEA AND VOMITING THAT IS NOT CONTROLLED WITH YOUR NAUSEA MEDICATION *UNUSUAL SHORTNESS OF BREATH *UNUSUAL BRUISING OR BLEEDING *URINARY PROBLEMS (pain or burning when urinating, or frequent urination) *BOWEL PROBLEMS (unusual diarrhea, constipation, pain near the anus) TENDERNESS IN MOUTH AND THROAT WITH OR WITHOUT PRESENCE OF ULCERS (sore throat, sores in mouth, or a toothache) UNUSUAL RASH, SWELLING OR PAIN  UNUSUAL VAGINAL DISCHARGE OR ITCHING   Items with * indicate a potential emergency and  should be followed up as soon as possible or go to the Emergency Department if any problems should occur.  Please show the CHEMOTHERAPY ALERT CARD or IMMUNOTHERAPY ALERT CARD at check-in to the Emergency Department and triage nurse.  Should you have questions after your visit or need to cancel or reschedule your appointment, please contact [phone] and follow the prompts.  Office hours are 8:00 a.m. to 4:30 p.m. Monday - Friday. Please note that voicemails left after 4:00 p.m. may not be returned until the following business day.  We are closed weekends and major holidays. You have access to a nurse at all times for urgent questions. Please call the main number to the clinic [phone] follow the prompts.  For any non-urgent questions, you may also contact your provider using MyChart. We now offer e-Visits for anyone 14and older to request care online for non-urgent symptoms. For details visit [email or web address]   Also download the MyChart app! Go to the app store, search "MyChart", open the app, select [facility], and log in with your MyChart username and password.  Masks are optional in the cancer centers. If you would like for your care team to wear a mask while they are taking care of you, please let them know. You may have one support person who is at least 57years old accompany you for your appointments.

## 2022-05-28 ENCOUNTER — Other Ambulatory Visit: Payer: Self-pay | Admitting: Internal Medicine

## 2022-05-28 LAB — TYPE AND SCREEN
ABO/RH(D): A POS
Antibody Screen: NEGATIVE
Donor AG Type: NEGATIVE
Unit division: 0

## 2022-05-28 LAB — BPAM RBC
Blood Product Expiration Date: 202312262359
ISSUE DATE / TIME: 202311170943
Unit Type and Rh: 6200

## 2022-05-29 ENCOUNTER — Other Ambulatory Visit: Payer: BC Managed Care – PPO

## 2022-05-29 ENCOUNTER — Telehealth: Payer: Self-pay | Admitting: Hematology

## 2022-05-29 ENCOUNTER — Other Ambulatory Visit: Payer: Self-pay | Admitting: Hematology

## 2022-05-30 ENCOUNTER — Ambulatory Visit: Payer: BC Managed Care – PPO

## 2022-05-30 ENCOUNTER — Other Ambulatory Visit: Payer: BC Managed Care – PPO

## 2022-05-30 ENCOUNTER — Ambulatory Visit: Payer: BC Managed Care – PPO | Admitting: Hematology

## 2022-06-04 ENCOUNTER — Inpatient Hospital Stay: Payer: BC Managed Care – PPO

## 2022-06-04 DIAGNOSIS — C9000 Multiple myeloma not having achieved remission: Secondary | ICD-10-CM | POA: Diagnosis not present

## 2022-06-04 LAB — CBC WITH DIFFERENTIAL/PLATELET
Abs Immature Granulocytes: 0.06 10*3/uL (ref 0.00–0.07)
Basophils Absolute: 0 10*3/uL (ref 0.0–0.1)
Basophils Relative: 0 %
Eosinophils Absolute: 0 10*3/uL (ref 0.0–0.5)
Eosinophils Relative: 0 %
HCT: 21.4 % — ABNORMAL LOW (ref 36.0–46.0)
Hemoglobin: 7.3 g/dL — ABNORMAL LOW (ref 12.0–15.0)
Immature Granulocytes: 1 %
Lymphocytes Relative: 12 %
Lymphs Abs: 0.6 10*3/uL — ABNORMAL LOW (ref 0.7–4.0)
MCH: 31.1 pg (ref 26.0–34.0)
MCHC: 34.1 g/dL (ref 30.0–36.0)
MCV: 91.1 fL (ref 80.0–100.0)
Monocytes Absolute: 0.3 10*3/uL (ref 0.1–1.0)
Monocytes Relative: 5 %
Neutro Abs: 4.3 10*3/uL (ref 1.7–7.7)
Neutrophils Relative %: 82 %
Platelets: 27 10*3/uL — CL (ref 150–400)
RBC: 2.35 MIL/uL — ABNORMAL LOW (ref 3.87–5.11)
RDW: 16.1 % — ABNORMAL HIGH (ref 11.5–15.5)
WBC: 5.3 10*3/uL (ref 4.0–10.5)
nRBC: 2.5 % — ABNORMAL HIGH (ref 0.0–0.2)

## 2022-06-04 LAB — COMPREHENSIVE METABOLIC PANEL
ALT: 14 U/L (ref 0–44)
AST: 14 U/L — ABNORMAL LOW (ref 15–41)
Albumin: 2.8 g/dL — ABNORMAL LOW (ref 3.5–5.0)
Alkaline Phosphatase: 87 U/L (ref 38–126)
Anion gap: 8 (ref 5–15)
BUN: 26 mg/dL — ABNORMAL HIGH (ref 6–20)
CO2: 21 mmol/L — ABNORMAL LOW (ref 22–32)
Calcium: 8.4 mg/dL — ABNORMAL LOW (ref 8.9–10.3)
Chloride: 102 mmol/L (ref 98–111)
Creatinine, Ser: 0.74 mg/dL (ref 0.44–1.00)
GFR, Estimated: >60 mL/min (ref >60)
Glucose, Bld: 273 mg/dL — ABNORMAL HIGH (ref 70–99)
Potassium: 3.3 mmol/L — ABNORMAL LOW (ref 3.5–5.1)
Sodium: 131 mmol/L — ABNORMAL LOW (ref 135–145)
Total Bilirubin: 0.6 mg/dL (ref 0.3–1.2)
Total Protein: 5.6 g/dL — ABNORMAL LOW (ref 6.5–8.1)

## 2022-06-04 LAB — SAMPLE TO BLOOD BANK

## 2022-06-04 LAB — MAGNESIUM: Magnesium: 1.7 mg/dL (ref 1.7–2.4)

## 2022-06-04 LAB — PREPARE RBC (CROSSMATCH)

## 2022-06-04 MED ORDER — HEPARIN SOD (PORK) LOCK FLUSH 100 UNIT/ML IV SOLN
500.0000 [IU] | Freq: Once | INTRAVENOUS | Status: AC
  Administered 2022-06-04: 500 [IU] via INTRAVENOUS

## 2022-06-04 MED ORDER — SODIUM CHLORIDE 0.9% FLUSH: 10.0000 mL | INTRAVENOUS | Status: DC | PRN

## 2022-06-04 MED ORDER — SODIUM CHLORIDE 0.9% FLUSH
10.0000 mL | INTRAVENOUS | Status: DC | PRN
  Administered 2022-06-04: 10 mL

## 2022-06-04 MED ORDER — HEPARIN SOD (PORK) LOCK FLUSH 100 UNIT/ML IV SOLN: 500.0000 [IU] | Freq: Once | INTRAVENOUS | Status: DC

## 2022-06-04 NOTE — Progress Notes (Signed)
CRITICAL VALUE ALERT Critical value received:  Platelets 27.  Date of notification:  06-04-2022 Time of notification: 13:01 pm.  Critical value read back:  Yes.   Nurse who received alert:  B.Raelle Chambers RN  MD notified time and response:  Dr. Delton Coombes @ 13:04pm. Patient scheduled for blood products tomorrow.

## 2022-06-04 NOTE — Addendum Note (Signed)
Addended by: Benjiman Core D on: 06/04/2022 01:13 PM   Modules accepted: Orders

## 2022-06-05 ENCOUNTER — Inpatient Hospital Stay: Payer: BC Managed Care – PPO

## 2022-06-05 DIAGNOSIS — C9000 Multiple myeloma not having achieved remission: Secondary | ICD-10-CM | POA: Diagnosis not present

## 2022-06-05 LAB — KAPPA/LAMBDA LIGHT CHAINS
Kappa free light chain: 1.6 mg/L — ABNORMAL LOW (ref 3.3–19.4)
Kappa, lambda light chain ratio: 0 — ABNORMAL LOW (ref 0.26–1.65)
Lambda free light chains: 1184.4 mg/L — ABNORMAL HIGH (ref 5.7–26.3)

## 2022-06-05 MED ORDER — OXYCODONE HCL 5 MG PO TABS
5.0000 mg | ORAL_TABLET | Freq: Once | ORAL | Status: AC
  Administered 2022-06-05: 5 mg via ORAL
  Filled 2022-06-05: qty 1

## 2022-06-05 MED ORDER — SODIUM CHLORIDE 0.9% FLUSH
10.0000 mL | INTRAVENOUS | Status: AC | PRN
  Administered 2022-06-05: 10 mL

## 2022-06-05 MED ORDER — SODIUM CHLORIDE 0.9% IV SOLUTION
250.0000 mL | Freq: Once | INTRAVENOUS | Status: AC
  Administered 2022-06-05: 250 mL via INTRAVENOUS

## 2022-06-05 MED ORDER — SODIUM CHLORIDE 0.9 % IV SOLN: 8.0000 mg | Freq: Once | INTRAVENOUS | Status: DC

## 2022-06-05 MED ORDER — OXYCODONE HCL 5 MG PO TABS
5.0000 mg | ORAL_TABLET | Freq: Once | ORAL | Status: DC
  Filled 2022-06-05: qty 1

## 2022-06-05 MED ORDER — HEPARIN SOD (PORK) LOCK FLUSH 100 UNIT/ML IV SOLN
500.0000 [IU] | Freq: Every day | INTRAVENOUS | Status: AC | PRN
  Administered 2022-06-05: 500 [IU]

## 2022-06-05 MED ORDER — ONDANSETRON HCL 4 MG/2ML IJ SOLN
8.0000 mg | Freq: Once | INTRAMUSCULAR | Status: AC
  Administered 2022-06-05: 8 mg via INTRAVENOUS
  Filled 2022-06-05: qty 4

## 2022-06-05 MED ORDER — ACETAMINOPHEN 325 MG PO TABS
325.0000 mg | ORAL_TABLET | Freq: Once | ORAL | Status: AC
  Administered 2022-06-05: 325 mg via ORAL
  Filled 2022-06-05: qty 1

## 2022-06-05 NOTE — Progress Notes (Signed)
Patient is nauseaous and vomiting, Dr. Delton Coombes aware, orders received for Zofran 8 mg IV x 1 dose now.

## 2022-06-05 NOTE — Progress Notes (Signed)
Blood transfusion stopped, line flushed with normal saline 57m, zofran given, line flushed with 148mnormal saline and blood transfusion resumed.  Patient repositioned in her chair, new warm blankets given.  She denies needing anything at this time. Husband is at chairside with her and will call out should they need anything.

## 2022-06-05 NOTE — Progress Notes (Signed)
Patient presents today for 1 unit of blood today. Vital signs stable.   Message received from Dr. Claudina Lick to give Percocet 5/325 mg PO x 1 dose now.   1 unit of blood given today per MD orders. Tolerated infusion without adverse affects. Vital signs stable. No complaints at this time. Discharged from clinic by wheel chair in stable condition. Alert and oriented x 3. F/U with [facility] as scheduled.

## 2022-06-05 NOTE — Progress Notes (Signed)
Patient reports nausea is better.  Blood transfusion is complete and she is ready to go home.

## 2022-06-05 NOTE — Patient Instructions (Signed)
[facility]  Discharge Instructions: Thank you for choosing [facility] to provide your oncology and hematology care.  If you have a lab appointment with the [facility], please come in thru the Main Entrance and check in at the main information desk.  Wear comfortable clothing and clothing appropriate for easy access to any Portacath or PICC line.   We strive to give you quality time with your provider. You may need to reschedule your appointment if you arrive late (15 or more minutes).  Arriving late affects you and other patients whose appointments are after yours.  Also, if you miss three or more appointments without notifying the office, you may be dismissed from the clinic at the provider's discretion.      For prescription refill requests, have your pharmacy contact our office and allow 72 hours for refills to be completed.    Today you received the following chemotherapy and/or immunotherapy agents 1 unit of blood.  Blood Transfusion, Adult, Care After After a blood transfusion, it is common to have: Bruising and soreness at the IV site. A headache. Follow these instructions at home: Your doctor may give you more instructions. If you have problems, contact your doctor. Insertion site care     Follow instructions from your doctor about how to take care of your insertion site. This is where an IV tube was put into your vein. Make sure you: Wash your hands with soap and water for at least 20 seconds before and after you change your bandage. If you cannot use soap and water, use hand sanitizer. Change your bandage as told by your doctor. Check your insertion site every day for signs of infection. Check for: Redness, swelling, or pain. Bleeding from the site. Warmth. Pus or a bad smell. General instructions Take over-the-counter and prescription medicines only as told by your doctor. Rest as told by your doctor. Go back to your normal  activities as told by your doctor. Keep all follow-up visits. You may need to have tests at certain times to check your blood. Contact a doctor if: You have itching or red, swollen areas of skin (hives). You have a fever or chills. You have pain in the head, back, or chest. You feel worried or nervous (anxious). You feel weak after doing your normal activities. You have any of these problems at the insertion site: Redness, swelling, warmth, or pain. Bleeding that does not stop with pressure. Pus or a bad smell. If you received your blood transfusion in an outpatient setting, you will be told whom to contact to report any reactions. Get help right away if: You have signs of a serious reaction. This may be coming from an allergy or the body's defense system (immune system). Signs include: Trouble breathing or shortness of breath. Swelling of the face or feeling warm (flushed). A widespread rash. Dark pee (urine) or blood in the pee. Fast heartbeat. These symptoms may be an emergency. Get help right away. Call 911. Do not wait to see if the symptoms will go away. Do not drive yourself to the hospital. Summary Bruising and soreness at the IV site are common. Check your insertion site every day for signs of infection. Rest as told by your doctor. Go back to your normal activities as told by your doctor. Get help right away if you have signs of a serious reaction. This information is not intended to replace advice given to you by your health care provider. Make  sure you discuss any questions you have with your health care provider. Document Revised: 09/22/2021 Document Reviewed: 09/22/2021 Elsevier Patient Education  [place].       To help prevent nausea and vomiting after your treatment, we encourage you to take your nausea medication as directed.  BELOW ARE SYMPTOMS THAT SHOULD BE REPORTED IMMEDIATELY: *FEVER GREATER THAN 100.4 F (38 C) OR HIGHER *CHILLS OR  SWEATING *NAUSEA AND VOMITING THAT IS NOT CONTROLLED WITH YOUR NAUSEA MEDICATION *UNUSUAL SHORTNESS OF BREATH *UNUSUAL BRUISING OR BLEEDING *URINARY PROBLEMS (pain or burning when urinating, or frequent urination) *BOWEL PROBLEMS (unusual diarrhea, constipation, pain near the anus) TENDERNESS IN MOUTH AND THROAT WITH OR WITHOUT PRESENCE OF ULCERS (sore throat, sores in mouth, or a toothache) UNUSUAL RASH, SWELLING OR PAIN  UNUSUAL VAGINAL DISCHARGE OR ITCHING   Items with * indicate a potential emergency and should be followed up as soon as possible or go to the Emergency Department if any problems should occur.  Please show the CHEMOTHERAPY ALERT CARD or IMMUNOTHERAPY ALERT CARD at check-in to the Emergency Department and triage nurse.  Should you have questions after your visit or need to cancel or reschedule your appointment, please contact [facility] [phone]  and follow the prompts.  Office hours are 8:00 a.m. to 4:30 p.m. Monday - Friday. Please note that voicemails left after 4:00 p.m. may not be returned until the following business day.  We are closed weekends and major holidays. You have access to a nurse at all times for urgent questions. Please call the main number to the clinic [phone] and follow the prompts.  For any non-urgent questions, you may also contact your provider using MyChart. We now offer e-Visits for anyone 65 and older to request care online for non-urgent symptoms. For details visit [email or web address].   Also download the MyChart app! Go to the app store, search "MyChart", open the app, select [facility], and log in with your MyChart username and password.  Masks are optional in the cancer centers. If you would like for your care team to wear a mask while they are taking care of you, please let them know. You may have one support person who is at least 57 years old accompany you for your appointments.

## 2022-06-06 ENCOUNTER — Other Ambulatory Visit: Payer: BC Managed Care – PPO

## 2022-06-06 LAB — BPAM RBC
Blood Product Expiration Date: 202312212359
ISSUE DATE / TIME: 202311281018
Unit Type and Rh: 6200

## 2022-06-06 LAB — TYPE AND SCREEN
ABO/RH(D): A POS
Antibody Screen: NEGATIVE
Donor AG Type: NEGATIVE
Unit division: 0

## 2022-06-06 LAB — PROTEIN ELECTROPHORESIS, SERUM
A/G Ratio: 1.3 (ref 0.7–1.7)
Albumin ELP: 2.9 g/dL (ref 2.9–4.4)
Alpha-1-Globulin: 0.3 g/dL (ref 0.0–0.4)
Alpha-2-Globulin: 0.8 g/dL (ref 0.4–1.0)
Beta Globulin: 0.5 g/dL — ABNORMAL LOW (ref 0.7–1.3)
Gamma Globulin: 0.5 g/dL (ref 0.4–1.8)
Globulin, Total: 2.2 g/dL (ref 2.2–3.9)
M-Spike, %: 0.4 g/dL — ABNORMAL HIGH
Total Protein ELP: 5.1 g/dL — ABNORMAL LOW (ref 6.0–8.5)

## 2022-06-07 ENCOUNTER — Ambulatory Visit: Payer: BC Managed Care – PPO

## 2022-06-07 ENCOUNTER — Other Ambulatory Visit: Payer: BC Managed Care – PPO

## 2022-06-08 ENCOUNTER — Emergency Department (HOSPITAL_COMMUNITY)
Admission: EM | Admit: 2022-06-08 | Discharge: 2022-06-08 | Disposition: A | Discharge status: Home / Self Care | Payer: BC Managed Care – PPO | Attending: Emergency Medicine | Admitting: Emergency Medicine

## 2022-06-08 ENCOUNTER — Emergency Department (HOSPITAL_COMMUNITY): Payer: BC Managed Care – PPO

## 2022-06-08 ENCOUNTER — Other Ambulatory Visit: Payer: Self-pay

## 2022-06-08 ENCOUNTER — Encounter (HOSPITAL_COMMUNITY): Payer: Self-pay | Admitting: *Deleted

## 2022-06-08 DIAGNOSIS — J069 Acute upper respiratory infection, unspecified: Secondary | ICD-10-CM | POA: Insufficient documentation

## 2022-06-08 DIAGNOSIS — D696 Thrombocytopenia, unspecified: Secondary | ICD-10-CM | POA: Diagnosis not present

## 2022-06-08 DIAGNOSIS — Z794 Long term (current) use of insulin: Secondary | ICD-10-CM | POA: Diagnosis not present

## 2022-06-08 DIAGNOSIS — R0602 Shortness of breath: Secondary | ICD-10-CM | POA: Diagnosis present

## 2022-06-08 DIAGNOSIS — Z8616 Personal history of COVID-19: Secondary | ICD-10-CM | POA: Insufficient documentation

## 2022-06-08 DIAGNOSIS — Z7982 Long term (current) use of aspirin: Secondary | ICD-10-CM | POA: Insufficient documentation

## 2022-06-08 DIAGNOSIS — Z1152 Encounter for screening for COVID-19: Secondary | ICD-10-CM | POA: Diagnosis not present

## 2022-06-08 DIAGNOSIS — C9000 Multiple myeloma not having achieved remission: Secondary | ICD-10-CM | POA: Insufficient documentation

## 2022-06-08 LAB — BRAIN NATRIURETIC PEPTIDE: B Natriuretic Peptide: 619 pg/mL — ABNORMAL HIGH (ref 0.0–100.0)

## 2022-06-08 LAB — CBC WITH DIFFERENTIAL/PLATELET
Abs Immature Granulocytes: 0.04 10*3/uL (ref 0.00–0.07)
Basophils Absolute: 0 10*3/uL (ref 0.0–0.1)
Basophils Relative: 0 %
Eosinophils Absolute: 0 10*3/uL (ref 0.0–0.5)
Eosinophils Relative: 0 %
HCT: 22.4 % — ABNORMAL LOW (ref 36.0–46.0)
Hemoglobin: 7.9 g/dL — ABNORMAL LOW (ref 12.0–15.0)
Immature Granulocytes: 1 %
Lymphocytes Relative: 18 %
Lymphs Abs: 0.7 10*3/uL (ref 0.7–4.0)
MCH: 31.3 pg (ref 26.0–34.0)
MCHC: 35.3 g/dL (ref 30.0–36.0)
MCV: 88.9 fL (ref 80.0–100.0)
Monocytes Absolute: 0.3 10*3/uL (ref 0.1–1.0)
Monocytes Relative: 8 %
Neutro Abs: 2.7 10*3/uL (ref 1.7–7.7)
Neutrophils Relative %: 73 %
Platelets: 12 10*3/uL — CL (ref 150–400)
RBC: 2.52 MIL/uL — ABNORMAL LOW (ref 3.87–5.11)
RDW: 15.4 % (ref 11.5–15.5)
WBC: 3.7 10*3/uL — ABNORMAL LOW (ref 4.0–10.5)
nRBC: 1.1 % — ABNORMAL HIGH (ref 0.0–0.2)

## 2022-06-08 LAB — BASIC METABOLIC PANEL
Anion gap: 12 (ref 5–15)
BUN: 17 mg/dL (ref 6–20)
CO2: 21 mmol/L — ABNORMAL LOW (ref 22–32)
Calcium: 7.6 mg/dL — ABNORMAL LOW (ref 8.9–10.3)
Chloride: 98 mmol/L (ref 98–111)
Creatinine, Ser: 0.74 mg/dL (ref 0.44–1.00)
GFR, Estimated: >60 mL/min (ref >60)
Glucose, Bld: 267 mg/dL — ABNORMAL HIGH (ref 70–99)
Potassium: 3.2 mmol/L — ABNORMAL LOW (ref 3.5–5.1)
Sodium: 131 mmol/L — ABNORMAL LOW (ref 135–145)

## 2022-06-08 LAB — TYPE AND SCREEN
ABO/RH(D): A POS
Antibody Screen: NEGATIVE

## 2022-06-08 LAB — RESP PANEL BY RT-PCR (FLU A&B, COVID) ARPGX2
Influenza A by PCR: NEGATIVE
Influenza B by PCR: NEGATIVE
SARS Coronavirus 2 by RT PCR: NEGATIVE

## 2022-06-08 MED ORDER — HEPARIN SOD (PORK) LOCK FLUSH 100 UNIT/ML IV SOLN
INTRAVENOUS | Status: AC
  Filled 2022-06-08: qty 5

## 2022-06-08 MED ORDER — DOXYCYCLINE HYCLATE 100 MG PO CAPS: 100.0000 mg | ORAL_CAPSULE | Freq: Two times a day (BID) | ORAL | 0 refills | Status: DC

## 2022-06-08 MED ORDER — ALBUTEROL SULFATE HFA 108 (90 BASE) MCG/ACT IN AERS
2.0000 | INHALATION_SPRAY | Freq: Once | RESPIRATORY_TRACT | Status: AC
  Administered 2022-06-08: 2 via RESPIRATORY_TRACT
  Filled 2022-06-08: qty 6.7

## 2022-06-08 MED ORDER — ALBUTEROL SULFATE HFA 108 (90 BASE) MCG/ACT IN AERS
INHALATION_SPRAY | RESPIRATORY_TRACT | Status: AC
  Filled 2022-06-08: qty 6.7

## 2022-06-08 MED ORDER — IPRATROPIUM-ALBUTEROL 0.5-2.5 (3) MG/3ML IN SOLN
3.0000 mL | Freq: Once | RESPIRATORY_TRACT | Status: AC
  Administered 2022-06-08: 3 mL via RESPIRATORY_TRACT
  Filled 2022-06-08: qty 3

## 2022-06-08 MED ORDER — SODIUM CHLORIDE 0.9 % IV SOLN
10.0000 mL/h | Freq: Once | INTRAVENOUS | Status: AC
  Administered 2022-06-08: 10 mL/h via INTRAVENOUS

## 2022-06-08 MED ORDER — HYDROXYZINE HCL 25 MG PO TABS
25.0000 mg | ORAL_TABLET | Freq: Once | ORAL | Status: AC
  Administered 2022-06-08: 25 mg via ORAL
  Filled 2022-06-08: qty 1

## 2022-06-08 NOTE — ED Triage Notes (Signed)
Pt c/o sob with cough x 2 days; pt has multiple myeloma and last took chemo this week; pt coughing up clear sputum

## 2022-06-08 NOTE — Discharge Instructions (Signed)
These call the office of Dr. Delton Coombes to be followed up on Monday.  If you develop any bleeding whatsoever come back to the emergency department immediately  Please take the doxycycline twice a day for 10 days

## 2022-06-08 NOTE — ED Notes (Signed)
Pt tolerating platelet administration well. Denies any SOB, itching, back pain, and other common transfusion reactions

## 2022-06-08 NOTE — ED Provider Notes (Signed)
Patient received at change of shift, oncology was consulted and recommended doxycycline if negative for COVID and flu, recommended 1 unit of platelets, this has been given, patient is not bleeding, vital signs are unremarkable, stable for discharge  Home on doxycycline   Joanna Chapel, MD 06/08/22 2042

## 2022-06-08 NOTE — ED Provider Notes (Signed)
[facility] EMERGENCY DEPARTMENT Provider Note   CSN: [id number] Arrival date & time: 06/08/22  1018     History {Add pertinent medical, surgical, social history, OB history to HPI:1} Chief Complaint  Patient presents with   Shortness of Breath    Joanna Reid is a 56 y.o. female.  Patient has a history of multiple myeloma.  She presents with 2-day history of cough and some shortness of breath.  She is coughing up clear sputum.  The history is provided by the patient and medical records. No language interpreter was used.  Shortness of Breath Severity:  Mild Onset quality:  Sudden Timing:  Constant Progression:  Waxing and waning Chronicity:  New Context: not activity   Relieved by:  Nothing Worsened by:  Nothing Associated symptoms: wheezing   Associated symptoms: no abdominal pain, no chest pain, no cough, no headaches and no rash        Home Medications Prior to Admission medications   Medication Sig Start Date End Date Taking? Authorizing Provider  acyclovir (ZOVIRAX) 400 MG tablet TAKE (1) TABLET BY MOUTH TWICE DAILY. 03/01/22   Derek Jack, MD  albuterol (VENTOLIN HFA) 108 (90 Base) MCG/ACT inhaler Inhale 2 puffs into the lungs every 4 (four) hours as needed for wheezing or shortness of breath. 04/16/22   Roxan Hockey, MD  allopurinol (ZYLOPRIM) 300 MG tablet TAKE (1) TABLET BY MOUTH ONCE DAILY. 05/28/22   Lindell Spar, MD  amLODipine (NORVASC) 2.5 MG tablet Take 2.5 mg by mouth daily. 01/27/21   [provider]  aspirin EC 81 MG tablet Take 1 tablet (81 mg total) by mouth daily with breakfast. For stroke prophylaxis 04/16/22   Roxan Hockey, MD  BD PEN NEEDLE NANO U/F 32G X 4 MM MISC Inject into the skin. 01/31/22   [provider]  bumetanide (BUMEX) 0.5 MG tablet Take 0.5 mg by mouth daily.  06/30/19   [provider]  carvedilol (COREG) 25 MG tablet Take 1 tablet (25 mg total) by mouth 2 (two) times daily. 12/29/19    Thurnell Lose, MD  DARZALEX FASPRO 1800-30000 MG-UT/15ML SOLN Inject 15 ml over 3-5 minutes subcutaneously once every 2 weeks. 03/27/22   Derek Jack, MD  dexamethasone (DECADRON) 4 MG tablet Take 5 tablets (20 mg total) by mouth 2 (two) times a week. Take on day 1 and day 3 along with Xpovio. 05/10/22   Derek Jack, MD  diclofenac Sodium (VOLTAREN) 1 % GEL Apply 2 g topically 4 (four) times daily. 05/01/21   Caccavale, Sophia, PA-C  docusate sodium (COLACE) 100 MG capsule Take 100 mg by mouth daily.     [provider]  Dulaglutide 1.5 MG/0.5ML SOPN Inject 1.5 mg into the skin every 7 (seven) days.    [provider]  escitalopram (LEXAPRO) 10 MG tablet TAKE (1) TABLET BY MOUTH ONCE DAILY. MAY START WITH 1/2 TABLET FOR 7 DAYS. 05/03/22   Lindell Spar, MD  fluticasone Asencion Islam) 50 MCG/ACT nasal spray INSTILL 2 SPRAYS INTO BOTH NOSTRILS DAILY 06/28/21   Lindell Spar, MD  Insulin Lispro w/ Trans Port 100 UNIT/ML SOPN Inject 20 Units into the skin See admin instructions. Inject before meals per sliding scale Max TDD 20 units 01/31/22   [provider]  KYPROLIS 30 MG SOLR Inject into the vein. 01/29/22   [provider]  Lactulose 20 GM/30ML SOLN Take 30 mLs (20 g total) by mouth daily as needed. 04/16/22   Roxan Hockey, MD  lidocaine-prilocaine (EMLA) cream Apply 1 Application topically as needed (Apply to port prior to treatment and flushes). 01/24/22   Derek Jack, MD  LORazepam (ATIVAN) 0.5 MG tablet TAKE (1) TABLET BY MOUTH EVERY EIGHT HOURS 05/03/22   Derek Jack, MD  losartan (COZAAR) 100 MG tablet Take 100 mg by mouth daily.    [provider]  magnesium oxide (MAG-OX) 400 (241.3 Mg) MG tablet Take 1 tablet (400 mg total) by mouth 3 (three) times daily. 10/02/18   Derek Jack, MD  metFORMIN (GLUCOPHAGE) 500 MG tablet Take 1 tablet (500 mg total) by mouth 2 (two) times daily. 04/16/22 09/10/22  Roxan Hockey, MD  metoCLOPramide (REGLAN) 10 MG tablet Take 1 tablet (10 mg total) by mouth every 8 (eight) hours as needed for nausea. 05/03/22   Derek Jack, MD  nitrofurantoin, macrocrystal-monohydrate, (MACROBID) 100 MG capsule TAKE (1) CAPSULE BY MOUTH AT BEDTIME. Patient taking differently: Take 100 mg by mouth at bedtime. 03/14/22   Derek Jack, MD  NOVOLOG FLEXPEN 100 UNIT/ML FlexPen Inject into the skin. 01/31/22   [provider]  oxyCODONE-acetaminophen (PERCOCET/ROXICET) 5-325 MG tablet Take 1 tablet by mouth 2 (two) times daily as needed for severe pain. 05/29/22   Derek Jack, MD  pantoprazole (PROTONIX) 40 MG tablet TAKE (1) TABLET BY MOUTH ONCE DAILY. Patient taking differently: Take 40 mg by mouth daily. 01/17/22   Lindell Spar, MD  polyethylene glycol (MIRALAX / GLYCOLAX) 17 g packet Take 17 g by mouth daily as needed for mild constipation. 04/16/22   Emokpae, Courage, MD  pregabalin (LYRICA) 200 MG capsule TAKE (1) CAPSULE BY MOUTH TWICE DAILY. Patient taking differently: Take 200 mg by mouth 2 (two) times daily. 01/25/22   Derek Jack, MD  Propylene Glycol (SYSTANE BALANCE) 0.6 % SOLN Apply 1 drop to eye daily as needed (dry eye).     [provider]  rosuvastatin (CRESTOR) 5 MG tablet Take 1 tablet (5 mg total) by mouth daily. 04/16/22   Roxan Hockey, MD  scopolamine (TRANSDERM-SCOP) 1 MG/3DAYS Place 1 patch (1.5 mg total) onto the skin every 3 (three) days. 03/14/22   Derek Jack, MD  selinexor (XPOVIO) Therapy Pack (80 mg twice weekly) Take 4 tablets (80 mg total) by mouth 2 (two) times a week. Take on days 1 and 3 of each week. 05/07/22   Derek Jack, MD  traMADol (ULTRAM) 50 MG tablet Take 1 tablet (50 mg total) by mouth every 8 (eight) hours as needed for severe pain. 04/24/22   Derek Jack, MD  valACYclovir (VALTREX) 1000 MG tablet Take 1 tablet (1,000 mg total) by mouth See admin instructions. TAKE 2  TABLETS BY MOUTH NOW; THEN 2 tab about 12 HOURS LATER--- for outbreaks 04/16/22   Roxan Hockey, MD      Allergies    Patient has no known allergies.    Review of Systems   Review of Systems  Constitutional:  Negative for appetite change and fatigue.  HENT:  Negative for congestion, ear discharge and sinus pressure.   Eyes:  Negative for discharge.  Respiratory:  Positive for shortness of breath and wheezing. Negative for cough.   Cardiovascular:  Negative for chest pain.  Gastrointestinal:  Negative for abdominal pain and diarrhea.  Genitourinary:  Negative for frequency and hematuria.  Musculoskeletal:  Negative for back pain.  Skin:  Negative for rash.  Neurological:  Negative for seizures and headaches.  Psychiatric/Behavioral:  Negative for hallucinations.     Physical Exam Updated Vital Signs  BP (!) 125/96   Pulse (!) 103   Temp 98.6 F (37 C) (Oral)   Resp 17   Ht 5' 4" (1.626 m)   Wt 107.5 kg   SpO2 100%   BMI 40.68 kg/m  Physical Exam Vitals and nursing note reviewed.  Constitutional:      Appearance: She is well-developed.  HENT:     Head: Normocephalic.     Right Ear: External ear normal.  Eyes:     General: No scleral icterus.    Conjunctiva/sclera: Conjunctivae normal.  Neck:     Thyroid: No thyromegaly.  Cardiovascular:     Rate and Rhythm: Normal rate and regular rhythm.     Heart sounds: No murmur heard.    No friction rub. No gallop.  Pulmonary:     Breath sounds: No stridor. Wheezing present. No rales.  Chest:     Chest wall: No tenderness.  Abdominal:     General: There is no distension.     Tenderness: There is no abdominal tenderness. There is no rebound.  Musculoskeletal:        General: Normal range of motion.     Cervical back: Neck supple.  Lymphadenopathy:     Cervical: No cervical adenopathy.  Skin:    Findings: No erythema or rash.  Neurological:     Mental Status: She is oriented to person, place, and time.     Motor: No  abnormal muscle tone.     Coordination: Coordination normal.  Psychiatric:        Behavior: Behavior normal.     ED Results / Procedures / Treatments   Labs (all labs ordered are listed, but only abnormal results are displayed) Labs Reviewed  CBC WITH DIFFERENTIAL/PLATELET - Abnormal; Notable for the following components:      Result Value   WBC 3.7 (*)    RBC 2.52 (*)    Hemoglobin 7.9 (*)    HCT 22.4 (*)    Platelets 12 (*)    nRBC 1.1 (*)    All other components within normal limits  BASIC METABOLIC PANEL - Abnormal; Notable for the following components:   Sodium 131 (*)    Potassium 3.2 (*)    CO2 21 (*)    Glucose, Bld 267 (*)    Calcium 7.6 (*)    All other components within normal limits  BRAIN NATRIURETIC PEPTIDE - Abnormal; Notable for the following components:   B Natriuretic Peptide 619.0 (*)    All other components within normal limits  RESP PANEL BY RT-PCR (FLU A&B, COVID) ARPGX2  TYPE AND SCREEN  PREPARE PLATELET PHERESIS    EKG None  Radiology DG Chest Port 1 View  Result Date: 06/08/2022 CLINICAL DATA:  Shortness of breath and cough x2 days. EXAM: PORTABLE CHEST 1 VIEW COMPARISON:  April 12, 2022 FINDINGS: There is stable right-sided venous Port-A-Cath positioning. The heart size and mediastinal contours are within normal limits. Low lung volumes are noted. There is no evidence of an acute infiltrate, pleural effusion or pneumothorax. Multilevel degenerative changes seen throughout the thoracic spine. IMPRESSION: Low lung volumes without evidence of acute or active cardiopulmonary disease. Electronically Signed   By: Virgina Norfolk M.D.   On: 06/08/2022 10:59    Procedures Procedures  {Document cardiac monitor, telemetry assessment procedure when appropriate:1}  Medications Ordered in ED Medications  0.9 %  sodium chloride infusion (has no administration in time range)  hydrOXYzine (ATARAX) tablet 25 mg (has no administration in time  range)   ipratropium-albuterol (DUONEB) 0.5-2.5 (3) MG/3ML nebulizer solution 3 mL (3 mLs Nebulization Given 06/08/22 1124)    ED Course/ Medical Decision Making/ A&P  I spoke with Dr. Delton Coombes about the low platelets.  He states the patient needs to be transfused 1 unit of platelets and he will see the patient on Monday.  He does not want her to take her chemotherapy on Saturday.  He also wants the patient placed on doxycycline if the COVID test and flu test come back negative                         Medical Decision Making Amount and/or Complexity of Data Reviewed Labs: ordered. Radiology: ordered.  Risk Prescription drug management.   Patient with thrombocytopenia and respiratory infection.  {Document critical care time when appropriate:1} {Document review of labs and clinical decision tools ie heart score, Chads2Vasc2 etc:1}  {Document your independent review of radiology images, and any outside records:1} {Document your discussion with family members, caretakers, and with consultants:1} {Document social determinants of health affecting pt's care:1} {Document your decision making why or why not admission, treatments were needed:1} Final Clinical Impression(s) / ED Diagnoses Final diagnoses:  None    Rx / DC Orders ED Discharge Orders     None

## 2022-06-08 NOTE — ED Notes (Addendum)
Per lab, awaiting platelet delivery to our facility as we currently do no have any more stored in lab today. Will be receiving one unit of platelets from [facility] to be given here at [facility]

## 2022-06-09 LAB — BPAM PLATELET PHERESIS
Blood Product Expiration Date: 202312012359
ISSUE DATE / TIME: 202312011654
Unit Type and Rh: 5100

## 2022-06-09 LAB — PREPARE PLATELET PHERESIS: Unit division: 0

## 2022-06-11 ENCOUNTER — Inpatient Hospital Stay: Payer: BC Managed Care – PPO

## 2022-06-11 ENCOUNTER — Inpatient Hospital Stay: Payer: BC Managed Care – PPO | Attending: Hematology | Admitting: Hematology

## 2022-06-11 DIAGNOSIS — C9000 Multiple myeloma not having achieved remission: Secondary | ICD-10-CM | POA: Diagnosis not present

## 2022-06-11 DIAGNOSIS — D61818 Other pancytopenia: Secondary | ICD-10-CM | POA: Diagnosis not present

## 2022-06-11 LAB — COMPREHENSIVE METABOLIC PANEL
ALT: 13 U/L (ref 0–44)
AST: 17 U/L (ref 15–41)
Albumin: 2.3 g/dL — ABNORMAL LOW (ref 3.5–5.0)
Alkaline Phosphatase: 112 U/L (ref 38–126)
Anion gap: 9 (ref 5–15)
BUN: 19 mg/dL (ref 6–20)
CO2: 21 mmol/L — ABNORMAL LOW (ref 22–32)
Calcium: 7.7 mg/dL — ABNORMAL LOW (ref 8.9–10.3)
Chloride: 100 mmol/L (ref 98–111)
Creatinine, Ser: 0.71 mg/dL (ref 0.44–1.00)
GFR, Estimated: >60 mL/min (ref >60)
Glucose, Bld: 195 mg/dL — ABNORMAL HIGH (ref 70–99)
Potassium: 3.1 mmol/L — ABNORMAL LOW (ref 3.5–5.1)
Sodium: 130 mmol/L — ABNORMAL LOW (ref 135–145)
Total Bilirubin: 1 mg/dL (ref 0.3–1.2)
Total Protein: 4.9 g/dL — ABNORMAL LOW (ref 6.5–8.1)

## 2022-06-11 LAB — CBC WITH DIFFERENTIAL/PLATELET
Abs Immature Granulocytes: 0.04 10*3/uL (ref 0.00–0.07)
Basophils Absolute: 0 10*3/uL (ref 0.0–0.1)
Basophils Relative: 0 %
Eosinophils Absolute: 0 10*3/uL (ref 0.0–0.5)
Eosinophils Relative: 0 %
HCT: 21.6 % — ABNORMAL LOW (ref 36.0–46.0)
Hemoglobin: 7.3 g/dL — ABNORMAL LOW (ref 12.0–15.0)
Immature Granulocytes: 1 %
Lymphocytes Relative: 23 %
Lymphs Abs: 0.7 10*3/uL (ref 0.7–4.0)
MCH: 31.3 pg (ref 26.0–34.0)
MCHC: 33.8 g/dL (ref 30.0–36.0)
MCV: 92.7 fL (ref 80.0–100.0)
Monocytes Absolute: 0.5 10*3/uL (ref 0.1–1.0)
Monocytes Relative: 16 %
Neutro Abs: 1.8 10*3/uL (ref 1.7–7.7)
Neutrophils Relative %: 60 %
Platelets: 11 10*3/uL — CL (ref 150–400)
RBC: 2.33 MIL/uL — ABNORMAL LOW (ref 3.87–5.11)
RDW: 16.1 % — ABNORMAL HIGH (ref 11.5–15.5)
WBC: 3 10*3/uL — ABNORMAL LOW (ref 4.0–10.5)
nRBC: 0 % (ref 0.0–0.2)

## 2022-06-11 LAB — SAMPLE TO BLOOD BANK

## 2022-06-11 LAB — PREPARE RBC (CROSSMATCH)

## 2022-06-11 LAB — LACTATE DEHYDROGENASE: LDH: 190 U/L (ref 98–192)

## 2022-06-11 LAB — MAGNESIUM: Magnesium: 1.4 mg/dL — ABNORMAL LOW (ref 1.7–2.4)

## 2022-06-11 MED ORDER — HEPARIN SOD (PORK) LOCK FLUSH 100 UNIT/ML IV SOLN: 500.0000 [IU] | Freq: Once | INTRAVENOUS | Status: DC

## 2022-06-11 MED ORDER — POTASSIUM CHLORIDE CRYS ER 20 MEQ PO TBCR
40.0000 meq | EXTENDED_RELEASE_TABLET | Freq: Once | ORAL | Status: AC
  Administered 2022-06-11: 40 meq via ORAL
  Filled 2022-06-11: qty 2

## 2022-06-11 MED ORDER — SODIUM CHLORIDE 0.9% FLUSH: 10.0000 mL | Freq: Once | INTRAVENOUS | Status: DC

## 2022-06-11 MED ORDER — HEPARIN SOD (PORK) LOCK FLUSH 100 UNIT/ML IV SOLN
500.0000 [IU] | Freq: Once | INTRAVENOUS | Status: AC
  Administered 2022-06-11: 500 [IU] via INTRAVENOUS

## 2022-06-11 MED ORDER — SODIUM CHLORIDE 0.9% FLUSH
10.0000 mL | INTRAVENOUS | Status: DC | PRN
  Administered 2022-06-11: 10 mL via INTRAVENOUS

## 2022-06-11 MED ORDER — SODIUM CHLORIDE 0.9 % IV SOLN: Freq: Once | INTRAVENOUS | Status: AC

## 2022-06-11 MED ORDER — MAGNESIUM SULFATE 4 GM/100ML IV SOLN
4.0000 g | Freq: Once | INTRAVENOUS | Status: AC
  Administered 2022-06-11: 4 g via INTRAVENOUS
  Filled 2022-06-11: qty 100

## 2022-06-11 NOTE — Progress Notes (Signed)
Patients port flushed without difficulty.  Good blood return noted with no bruising or swelling noted at site.  Patient to remain accessed for possible fluids.  

## 2022-06-11 NOTE — Progress Notes (Signed)
CRITICAL VALUE ALERT Critical value received:  platelets 11 Date of notification:  06-11-22 Time of notification: 1430 Critical value read back:  Yes.   Nurse who received alert:  C. Sumaiyah Markert RN MD notified time and response:  1432,will give one unit of blood and platelets tomorrow.

## 2022-06-11 NOTE — Progress Notes (Signed)
4g IV magnesium sulfate and 40 mEq potassium chloride p.o x 1 dose given today per MD orders. Tolerated infusion without adverse affects. Vital signs stable. No complaints at this time. Discharged from clinic via wheelchair in stable condition. Alert and oriented x 3. F/U with [facility] as scheduled.

## 2022-06-11 NOTE — Progress Notes (Signed)
Verbal orders from Dr. Delton Coombes to give potassium '40mg'$  PO today along with Magnesium 4grams .

## 2022-06-11 NOTE — Patient Instructions (Addendum)
[facility]  Discharge Instructions  You were seen and examined today by Dr. Delton Coombes.  Dr. Delton Coombes is ordering further labs and a scan of your brain.   Dr. Delton Coombes is setting you up for blood and platelet transfusion tomorrow. Magnesium and Potassium will be given today  Hold the medication for now until we see what your labs and scan is looking like.  Follow-up as scheduled in 1 week.    Thank you for choosing [facility] to provide your oncology and hematology care.   To afford each patient quality time with our provider, please arrive at least 15 minutes before your scheduled appointment time. You may need to reschedule your appointment if you arrive late (10 or more minutes). Arriving late affects you and other patients whose appointments are after yours.  Also, if you miss three or more appointments without notifying the office, you may be dismissed from the clinic at the provider's discretion.    Again, thank you for choosing [facility].  Our hope is that these requests will decrease the amount of time that you wait before being seen by our physicians.   If you have a lab appointment with the [facility] please come in thru the Main Entrance and check in at the main information desk.           _____________________________________________________________  Should you have questions after your visit to [facility], please contact our office at [phone] and follow the prompts.  Our office hours are 8:00 a.m. to 4:30 p.m. Monday - Thursday and 8:00 a.m. to 2:30 p.m. Friday.  Please note that voicemails left after 4:00 p.m. may not be returned until the following business day.  We are closed weekends and all major holidays.  You do have access to a nurse 24-7, just call the main number to the clinic [phone] and do not press any options, hold on the line and a nurse will answer the phone.     For prescription refill requests, have your pharmacy contact our office and allow 72 hours.    Masks are optional in the cancer centers. If you would like for your care team to wear a mask while they are taking care of you, please let them know. You may have one support person who is at least 57 years old accompany you for your appointments.

## 2022-06-11 NOTE — Progress Notes (Signed)
Dr. Delton Coombes is ordering Magnesium 4 grams IV and K-dur 40 mg by mouth today. Then she will get 1 unit of platelets and 1 unit packed RBC tomorrow.

## 2022-06-11 NOTE — Patient Instructions (Signed)
[facility]  Discharge Instructions: Thank you for choosing [facility] to provide your oncology and hematology care.  If you have a lab appointment with the [facility], please come in thru the Main Entrance and check in at the main information desk.  Wear comfortable clothing and clothing appropriate for easy access to any Portacath or PICC line.   We strive to give you quality time with your provider. You may need to reschedule your appointment if you arrive late (15 or more minutes).  Arriving late affects you and other patients whose appointments are after yours.  Also, if you miss three or more appointments without notifying the office, you may be dismissed from the clinic at the provider's discretion.      For prescription refill requests, have your pharmacy contact our office and allow 72 hours for refills to be completed.    Today you received 4g IV magnesium sulfate and 40 mEq potassium chloride p.o x 1 dose     BELOW ARE SYMPTOMS THAT SHOULD BE REPORTED IMMEDIATELY: *FEVER GREATER THAN 100.4 F (38 C) OR HIGHER *CHILLS OR SWEATING *NAUSEA AND VOMITING THAT IS NOT CONTROLLED WITH YOUR NAUSEA MEDICATION *UNUSUAL SHORTNESS OF BREATH *UNUSUAL BRUISING OR BLEEDING *URINARY PROBLEMS (pain or burning when urinating, or frequent urination) *BOWEL PROBLEMS (unusual diarrhea, constipation, pain near the anus) TENDERNESS IN MOUTH AND THROAT WITH OR WITHOUT PRESENCE OF ULCERS (sore throat, sores in mouth, or a toothache) UNUSUAL RASH, SWELLING OR PAIN  UNUSUAL VAGINAL DISCHARGE OR ITCHING   Items with * indicate a potential emergency and should be followed up as soon as possible or go to the Emergency Department if any problems should occur.  Please show the CHEMOTHERAPY ALERT CARD or IMMUNOTHERAPY ALERT CARD at check-in to the Emergency Department and triage nurse.  Should you have questions after your visit or need to cancel or reschedule your  appointment, please contact [facility] [phone]  and follow the prompts.  Office hours are 8:00 a.m. to 4:30 p.m. Monday - Friday. Please note that voicemails left after 4:00 p.m. may not be returned until the following business day.  We are closed weekends and major holidays. You have access to a nurse at all times for urgent questions. Please call the main number to the clinic [phone] and follow the prompts.  For any non-urgent questions, you may also contact your provider using MyChart. We now offer e-Visits for anyone 23 and older to request care online for non-urgent symptoms. For details visit [email or web address].   Also download the MyChart app! Go to the app store, search "MyChart", open the app, select [facility], and log in with your MyChart username and password.  Masks are optional in the cancer centers. If you would like for your care team to wear a mask while they are taking care of you, please let them know. You may have one support person who is at least 57 years old accompany you for your appointments.

## 2022-06-12 ENCOUNTER — Ambulatory Visit (HOSPITAL_COMMUNITY)
Admission: RE | Admit: 2022-06-12 | Discharge: 2022-06-12 | Disposition: A | Discharge status: Home / Self Care | Payer: BC Managed Care – PPO | Source: Ambulatory Visit | Attending: Hematology | Admitting: Hematology

## 2022-06-12 ENCOUNTER — Inpatient Hospital Stay: Payer: BC Managed Care – PPO

## 2022-06-12 DIAGNOSIS — C9000 Multiple myeloma not having achieved remission: Secondary | ICD-10-CM | POA: Diagnosis present

## 2022-06-12 MED ORDER — HEPARIN SOD (PORK) LOCK FLUSH 100 UNIT/ML IV SOLN
500.0000 [IU] | Freq: Every day | INTRAVENOUS | Status: AC | PRN
  Administered 2022-06-12: 500 [IU]

## 2022-06-12 MED ORDER — SODIUM CHLORIDE 0.9% IV SOLUTION
250.0000 mL | Freq: Once | INTRAVENOUS | Status: AC
  Administered 2022-06-12: 250 mL via INTRAVENOUS

## 2022-06-12 MED ORDER — SODIUM CHLORIDE 0.9% FLUSH
10.0000 mL | INTRAVENOUS | Status: AC | PRN
  Administered 2022-06-12: 10 mL

## 2022-06-12 MED ORDER — GADOBUTROL 1 MMOL/ML IV SOLN
10.0000 mL | Freq: Once | INTRAVENOUS | Status: AC | PRN
  Administered 2022-06-12: 10 mL via INTRAVENOUS

## 2022-06-12 MED ORDER — DIPHENHYDRAMINE HCL 25 MG PO CAPS
25.0000 mg | ORAL_CAPSULE | Freq: Once | ORAL | Status: AC
  Administered 2022-06-12: 25 mg via ORAL
  Filled 2022-06-12: qty 1

## 2022-06-12 NOTE — Progress Notes (Signed)
Patient presents today for blood and platelet transfusions.  Patient will receive one unit of platelets and one unit of PRBC today per provider orders.  Patient is in stable condition with complaints of weakness.  Vital signs are stable.  Tylenol 1000 mg taken at 0800 at home prior to visit.  We will hold Tylenol here.  We will proceed with transfusions per provider orders.   Patient tolerated transfusions well with no complaints voiced.  Patient left via wheelchair with husband in stable condition.  Vital signs stable at discharge.  Follow up as scheduled.

## 2022-06-12 NOTE — Patient Instructions (Signed)
[facility]  Discharge Instructions: Thank you for choosing [facility] to provide your oncology and hematology care.  If you have a lab appointment with the Cancer Center, please come in thru the Main Entrance and check in at the main information desk.  Wear comfortable clothing and clothing appropriate for easy access to any Portacath or PICC line.   We strive to give you quality time with your provider. You may need to reschedule your appointment if you arrive late (15 or more minutes).  Arriving late affects you and other patients whose appointments are after yours.  Also, if you miss three or more appointments without notifying the office, you may be dismissed from the clinic at the provider's discretion.      For prescription refill requests, have your pharmacy contact our office and allow 72 hours for refills to be completed.     To help prevent nausea and vomiting after your treatment, we encourage you to take your nausea medication as directed.  BELOW ARE SYMPTOMS THAT SHOULD BE REPORTED IMMEDIATELY: *FEVER GREATER THAN 100.4 F (38 C) OR HIGHER *CHILLS OR SWEATING *NAUSEA AND VOMITING THAT IS NOT CONTROLLED WITH YOUR NAUSEA MEDICATION *UNUSUAL SHORTNESS OF BREATH *UNUSUAL BRUISING OR BLEEDING *URINARY PROBLEMS (pain or burning when urinating, or frequent urination) *BOWEL PROBLEMS (unusual diarrhea, constipation, pain near the anus) TENDERNESS IN MOUTH AND THROAT WITH OR WITHOUT PRESENCE OF ULCERS (sore throat, sores in mouth, or a toothache) UNUSUAL RASH, SWELLING OR PAIN  UNUSUAL VAGINAL DISCHARGE OR ITCHING   Items with * indicate a potential emergency and should be followed up as soon as possible or go to the Emergency Department if any problems should occur.  Please show the CHEMOTHERAPY ALERT CARD or IMMUNOTHERAPY ALERT CARD at check-in to the Emergency Department and triage nurse.  Should you have questions after your visit or need to  cancel or reschedule your appointment, please contact [facility] AT  [phone]  and follow the prompts.  Office hours are 8:00 a.m. to 4:30 p.m. Monday - Friday. Please note that voicemails left after 4:00 p.m. may not be returned until the following business day.  We are closed weekends and major holidays. You have access to a nurse at all times for urgent questions. Please call the main number to the clinic [phone] and follow the prompts.  For any non-urgent questions, you may also contact your provider using MyChart. We now offer e-Visits for anyone 18 and older to request care online for non-urgent symptoms. For details visit [email or web address].   Also download the MyChart app! Go to the app store, search "MyChart", open the app, select [facility], and log in with your MyChart username and password.  Masks are optional in the cancer centers. If you would like for your care team to wear a mask while they are taking care of you, please let them know. You may have one support person who is at least 57 years old accompany you for your appointments.  

## 2022-06-13 ENCOUNTER — Other Ambulatory Visit: Payer: BC Managed Care – PPO

## 2022-06-14 ENCOUNTER — Other Ambulatory Visit: Payer: BC Managed Care – PPO

## 2022-06-14 ENCOUNTER — Ambulatory Visit: Payer: BC Managed Care – PPO

## 2022-06-15 LAB — BPAM PLATELET PHERESIS
Blood Product Expiration Date: 202312082359
ISSUE DATE / TIME: 202312051354
Unit Type and Rh: 6200

## 2022-06-15 LAB — TYPE AND SCREEN
ABO/RH(D): A POS
Antibody Screen: NEGATIVE
Donor AG Type: NEGATIVE
Unit division: 0

## 2022-06-15 LAB — PREPARE PLATELET PHERESIS: Unit division: 0

## 2022-06-15 LAB — BPAM RBC
Blood Product Expiration Date: 202401092359
ISSUE DATE / TIME: 202312051203
Unit Type and Rh: 6200

## 2022-06-18 ENCOUNTER — Other Ambulatory Visit: Payer: Self-pay

## 2022-06-18 ENCOUNTER — Encounter (HOSPITAL_COMMUNITY): Payer: Self-pay | Admitting: *Deleted

## 2022-06-18 ENCOUNTER — Inpatient Hospital Stay (HOSPITAL_COMMUNITY)
Admission: EM | Admit: 2022-06-18 | Discharge: 2022-07-11 | DRG: 100 | Disposition: A | Discharge status: Home / Self Care | Payer: BC Managed Care – PPO | Attending: Internal Medicine | Admitting: Internal Medicine

## 2022-06-18 ENCOUNTER — Emergency Department (HOSPITAL_COMMUNITY): Payer: BC Managed Care – PPO

## 2022-06-18 ENCOUNTER — Inpatient Hospital Stay (HOSPITAL_COMMUNITY): Payer: BC Managed Care – PPO

## 2022-06-18 DIAGNOSIS — I6389 Other cerebral infarction: Secondary | ICD-10-CM | POA: Diagnosis not present

## 2022-06-18 DIAGNOSIS — R4182 Altered mental status, unspecified: Secondary | ICD-10-CM | POA: Diagnosis not present

## 2022-06-18 DIAGNOSIS — D696 Thrombocytopenia, unspecified: Secondary | ICD-10-CM

## 2022-06-18 DIAGNOSIS — G40901 Epilepsy, unspecified, not intractable, with status epilepticus: Secondary | ICD-10-CM | POA: Diagnosis not present

## 2022-06-18 DIAGNOSIS — I10 Essential (primary) hypertension: Secondary | ICD-10-CM | POA: Diagnosis present

## 2022-06-18 DIAGNOSIS — K769 Liver disease, unspecified: Secondary | ICD-10-CM | POA: Diagnosis present

## 2022-06-18 DIAGNOSIS — Z1152 Encounter for screening for COVID-19: Secondary | ICD-10-CM | POA: Diagnosis not present

## 2022-06-18 DIAGNOSIS — Z6841 Body Mass Index (BMI) 40.0 and over, adult: Secondary | ICD-10-CM

## 2022-06-18 DIAGNOSIS — G620 Drug-induced polyneuropathy: Secondary | ICD-10-CM | POA: Diagnosis present

## 2022-06-18 DIAGNOSIS — I639 Cerebral infarction, unspecified: Secondary | ICD-10-CM | POA: Diagnosis present

## 2022-06-18 DIAGNOSIS — R531 Weakness: Secondary | ICD-10-CM | POA: Diagnosis not present

## 2022-06-18 DIAGNOSIS — F05 Delirium due to known physiological condition: Secondary | ICD-10-CM | POA: Diagnosis not present

## 2022-06-18 DIAGNOSIS — G43909 Migraine, unspecified, not intractable, without status migrainosus: Secondary | ICD-10-CM | POA: Diagnosis present

## 2022-06-18 DIAGNOSIS — D6959 Other secondary thrombocytopenia: Secondary | ICD-10-CM | POA: Diagnosis not present

## 2022-06-18 DIAGNOSIS — M109 Gout, unspecified: Secondary | ICD-10-CM | POA: Diagnosis present

## 2022-06-18 DIAGNOSIS — K219 Gastro-esophageal reflux disease without esophagitis: Secondary | ICD-10-CM | POA: Diagnosis present

## 2022-06-18 DIAGNOSIS — D72819 Decreased white blood cell count, unspecified: Secondary | ICD-10-CM | POA: Diagnosis not present

## 2022-06-18 DIAGNOSIS — Z79899 Other long term (current) drug therapy: Secondary | ICD-10-CM | POA: Diagnosis not present

## 2022-06-18 DIAGNOSIS — G40101 Localization-related (focal) (partial) symptomatic epilepsy and epileptic syndromes with simple partial seizures, not intractable, with status epilepticus: Principal | ICD-10-CM | POA: Diagnosis present

## 2022-06-18 DIAGNOSIS — Z7984 Long term (current) use of oral hypoglycemic drugs: Secondary | ICD-10-CM

## 2022-06-18 DIAGNOSIS — Z792 Long term (current) use of antibiotics: Secondary | ICD-10-CM

## 2022-06-18 DIAGNOSIS — E876 Hypokalemia: Secondary | ICD-10-CM | POA: Diagnosis present

## 2022-06-18 DIAGNOSIS — D61818 Other pancytopenia: Secondary | ICD-10-CM | POA: Diagnosis not present

## 2022-06-18 DIAGNOSIS — G8929 Other chronic pain: Secondary | ICD-10-CM | POA: Diagnosis present

## 2022-06-18 DIAGNOSIS — E038 Other specified hypothyroidism: Secondary | ICD-10-CM | POA: Diagnosis present

## 2022-06-18 DIAGNOSIS — G8194 Hemiplegia, unspecified affecting left nondominant side: Secondary | ICD-10-CM | POA: Diagnosis present

## 2022-06-18 DIAGNOSIS — K5900 Constipation, unspecified: Secondary | ICD-10-CM | POA: Diagnosis not present

## 2022-06-18 DIAGNOSIS — G40409 Other generalized epilepsy and epileptic syndromes, not intractable, without status epilepticus: Secondary | ICD-10-CM | POA: Diagnosis present

## 2022-06-18 DIAGNOSIS — E119 Type 2 diabetes mellitus without complications: Secondary | ICD-10-CM | POA: Diagnosis present

## 2022-06-18 DIAGNOSIS — I69320 Aphasia following cerebral infarction: Secondary | ICD-10-CM

## 2022-06-18 DIAGNOSIS — R471 Dysarthria and anarthria: Secondary | ICD-10-CM | POA: Diagnosis present

## 2022-06-18 DIAGNOSIS — Z8616 Personal history of COVID-19: Secondary | ICD-10-CM

## 2022-06-18 DIAGNOSIS — E785 Hyperlipidemia, unspecified: Secondary | ICD-10-CM | POA: Diagnosis present

## 2022-06-18 DIAGNOSIS — I6930 Unspecified sequelae of cerebral infarction: Secondary | ICD-10-CM

## 2022-06-18 DIAGNOSIS — N3281 Overactive bladder: Secondary | ICD-10-CM | POA: Diagnosis present

## 2022-06-18 DIAGNOSIS — Z8249 Family history of ischemic heart disease and other diseases of the circulatory system: Secondary | ICD-10-CM

## 2022-06-18 DIAGNOSIS — Z87891 Personal history of nicotine dependence: Secondary | ICD-10-CM

## 2022-06-18 DIAGNOSIS — Z833 Family history of diabetes mellitus: Secondary | ICD-10-CM

## 2022-06-18 DIAGNOSIS — R29727 NIHSS score 27: Secondary | ICD-10-CM | POA: Diagnosis present

## 2022-06-18 DIAGNOSIS — I63411 Cerebral infarction due to embolism of right middle cerebral artery: Secondary | ICD-10-CM | POA: Diagnosis present

## 2022-06-18 DIAGNOSIS — Z23 Encounter for immunization: Secondary | ICD-10-CM | POA: Diagnosis not present

## 2022-06-18 DIAGNOSIS — D630 Anemia in neoplastic disease: Secondary | ICD-10-CM | POA: Diagnosis not present

## 2022-06-18 DIAGNOSIS — G894 Chronic pain syndrome: Secondary | ICD-10-CM | POA: Diagnosis present

## 2022-06-18 DIAGNOSIS — C9002 Multiple myeloma in relapse: Secondary | ICD-10-CM

## 2022-06-18 DIAGNOSIS — R569 Unspecified convulsions: Secondary | ICD-10-CM | POA: Diagnosis not present

## 2022-06-18 DIAGNOSIS — N200 Calculus of kidney: Secondary | ICD-10-CM | POA: Diagnosis present

## 2022-06-18 DIAGNOSIS — I517 Cardiomegaly: Secondary | ICD-10-CM | POA: Diagnosis present

## 2022-06-18 DIAGNOSIS — C9000 Multiple myeloma not having achieved remission: Secondary | ICD-10-CM | POA: Diagnosis present

## 2022-06-18 DIAGNOSIS — Z7982 Long term (current) use of aspirin: Secondary | ICD-10-CM

## 2022-06-18 DIAGNOSIS — Z809 Family history of malignant neoplasm, unspecified: Secondary | ICD-10-CM

## 2022-06-18 DIAGNOSIS — Z825 Family history of asthma and other chronic lower respiratory diseases: Secondary | ICD-10-CM

## 2022-06-18 DIAGNOSIS — D649 Anemia, unspecified: Secondary | ICD-10-CM | POA: Diagnosis present

## 2022-06-18 DIAGNOSIS — R29810 Facial weakness: Secondary | ICD-10-CM | POA: Diagnosis present

## 2022-06-18 LAB — CBC WITH DIFFERENTIAL/PLATELET
Abs Immature Granulocytes: 0.1 10*3/uL — ABNORMAL HIGH (ref 0.00–0.07)
Basophils Absolute: 0 10*3/uL (ref 0.0–0.1)
Basophils Relative: 0 %
Eosinophils Absolute: 0 10*3/uL (ref 0.0–0.5)
Eosinophils Relative: 0 %
HCT: 22.5 % — ABNORMAL LOW (ref 36.0–46.0)
Hemoglobin: 7.7 g/dL — ABNORMAL LOW (ref 12.0–15.0)
Immature Granulocytes: 4 %
Lymphocytes Relative: 25 %
Lymphs Abs: 0.7 10*3/uL (ref 0.7–4.0)
MCH: 29.8 pg (ref 26.0–34.0)
MCHC: 34.2 g/dL (ref 30.0–36.0)
MCV: 87.2 fL (ref 80.0–100.0)
Monocytes Absolute: 0.6 10*3/uL (ref 0.1–1.0)
Monocytes Relative: 22 %
Neutro Abs: 1.3 10*3/uL — ABNORMAL LOW (ref 1.7–7.7)
Neutrophils Relative %: 49 %
Platelets: 15 10*3/uL — CL (ref 150–400)
RBC: 2.58 MIL/uL — ABNORMAL LOW (ref 3.87–5.11)
RDW: 18 % — ABNORMAL HIGH (ref 11.5–15.5)
WBC: 2.6 10*3/uL — ABNORMAL LOW (ref 4.0–10.5)
nRBC: 0.8 % — ABNORMAL HIGH (ref 0.0–0.2)

## 2022-06-18 LAB — MAGNESIUM: Magnesium: 0.8 mg/dL — CL (ref 1.7–2.4)

## 2022-06-18 LAB — URINALYSIS, ROUTINE W REFLEX MICROSCOPIC
Bilirubin Urine: NEGATIVE
Glucose, UA: NEGATIVE mg/dL
Hgb urine dipstick: NEGATIVE
Ketones, ur: 5 mg/dL — AB
Leukocytes,Ua: NEGATIVE
Nitrite: NEGATIVE
Protein, ur: 30 mg/dL — AB
Specific Gravity, Urine: 1.028 (ref 1.005–1.030)
pH: 5 (ref 5.0–8.0)

## 2022-06-18 LAB — APTT: aPTT: 39 seconds — ABNORMAL HIGH (ref 24–36)

## 2022-06-18 LAB — COMPREHENSIVE METABOLIC PANEL
ALT: 15 U/L (ref 0–44)
AST: 25 U/L (ref 15–41)
Albumin: 2.5 g/dL — ABNORMAL LOW (ref 3.5–5.0)
Alkaline Phosphatase: 165 U/L — ABNORMAL HIGH (ref 38–126)
Anion gap: 11 (ref 5–15)
BUN: 21 mg/dL — ABNORMAL HIGH (ref 6–20)
CO2: 25 mmol/L (ref 22–32)
Calcium: 8 mg/dL — ABNORMAL LOW (ref 8.9–10.3)
Chloride: 100 mmol/L (ref 98–111)
Creatinine, Ser: 0.6 mg/dL (ref 0.44–1.00)
GFR, Estimated: >60 mL/min (ref >60)
Glucose, Bld: 110 mg/dL — ABNORMAL HIGH (ref 70–99)
Potassium: 2.5 mmol/L — CL (ref 3.5–5.1)
Sodium: 136 mmol/L (ref 135–145)
Total Bilirubin: 1.8 mg/dL — ABNORMAL HIGH (ref 0.3–1.2)
Total Protein: 5.2 g/dL — ABNORMAL LOW (ref 6.5–8.1)

## 2022-06-18 LAB — MRSA NEXT GEN BY PCR, NASAL: MRSA by PCR Next Gen: NOT DETECTED

## 2022-06-18 LAB — PREPARE RBC (CROSSMATCH)

## 2022-06-18 LAB — RESP PANEL BY RT-PCR (FLU A&B, COVID) ARPGX2
Influenza A by PCR: NEGATIVE
Influenza B by PCR: NEGATIVE
SARS Coronavirus 2 by RT PCR: NEGATIVE

## 2022-06-18 LAB — GLUCOSE, CAPILLARY: Glucose-Capillary: 122 mg/dL — ABNORMAL HIGH (ref 70–99)

## 2022-06-18 LAB — PROCALCITONIN: Procalcitonin: 0.19 ng/mL

## 2022-06-18 LAB — LACTIC ACID, PLASMA
Lactic Acid, Venous: 2 mmol/L (ref 0.5–1.9)
Lactic Acid, Venous: 1.1 mmol/L (ref 0.5–1.9)

## 2022-06-18 LAB — PROTIME-INR
INR: 1.3 — ABNORMAL HIGH (ref 0.8–1.2)
Prothrombin Time: 15.7 seconds — ABNORMAL HIGH (ref 11.4–15.2)

## 2022-06-18 MED ORDER — LACTATED RINGERS IV SOLN: INTRAVENOUS | Status: DC

## 2022-06-18 MED ORDER — SODIUM CHLORIDE 0.9% IV SOLUTION: Freq: Once | INTRAVENOUS | Status: AC

## 2022-06-18 MED ORDER — ONDANSETRON HCL 4 MG PO TABS: 4.0000 mg | ORAL_TABLET | Freq: Four times a day (QID) | ORAL | Status: DC | PRN

## 2022-06-18 MED ORDER — SODIUM CHLORIDE 0.9 % IV BOLUS
2000.0000 mL | Freq: Once | INTRAVENOUS | Status: AC
  Administered 2022-06-18: 2000 mL via INTRAVENOUS

## 2022-06-18 MED ORDER — POTASSIUM CHLORIDE 10 MEQ/100ML IV SOLN: 10.0000 meq | Freq: Once | INTRAVENOUS | Status: DC

## 2022-06-18 MED ORDER — ALLOPURINOL 100 MG PO TABS
300.0000 mg | ORAL_TABLET | Freq: Every day | ORAL | Status: DC
  Administered 2022-06-27 – 2022-07-11 (×15): 300 mg via ORAL
  Filled 2022-06-18 (×16): qty 3

## 2022-06-18 MED ORDER — AMLODIPINE BESYLATE 5 MG PO TABS: 2.5000 mg | ORAL_TABLET | Freq: Every day | ORAL | Status: DC

## 2022-06-18 MED ORDER — SODIUM CHLORIDE 0.9 % IV SOLN: 2.0000 g | Freq: Three times a day (TID) | INTRAVENOUS | Status: DC

## 2022-06-18 MED ORDER — PANTOPRAZOLE SODIUM 40 MG PO TBEC
40.0000 mg | DELAYED_RELEASE_TABLET | Freq: Every day | ORAL | Status: DC
  Administered 2022-06-27 – 2022-07-11 (×15): 40 mg via ORAL
  Filled 2022-06-18 (×16): qty 1

## 2022-06-18 MED ORDER — POTASSIUM CHLORIDE 10 MEQ/100ML IV SOLN
10.0000 meq | INTRAVENOUS | Status: AC
  Administered 2022-06-18 (×4): 10 meq via INTRAVENOUS
  Filled 2022-06-18 (×4): qty 100

## 2022-06-18 MED ORDER — LEVETIRACETAM IN NACL 500 MG/100ML IV SOLN
500.0000 mg | Freq: Two times a day (BID) | INTRAVENOUS | Status: DC
  Administered 2022-06-18 – 2022-06-19 (×2): 500 mg via INTRAVENOUS
  Filled 2022-06-18 (×2): qty 100

## 2022-06-18 MED ORDER — ASPIRIN 325 MG PO TBEC: 325.0000 mg | DELAYED_RELEASE_TABLET | Freq: Every day | ORAL | Status: DC

## 2022-06-18 MED ORDER — DOCUSATE SODIUM 100 MG PO CAPS
100.0000 mg | ORAL_CAPSULE | Freq: Every day | ORAL | Status: DC
  Administered 2022-06-27 – 2022-07-03 (×7): 100 mg via ORAL
  Filled 2022-06-18 (×8): qty 1

## 2022-06-18 MED ORDER — POLYETHYLENE GLYCOL 3350 17 G PO PACK
17.0000 g | PACK | Freq: Every day | ORAL | Status: DC | PRN
  Administered 2022-06-29 – 2022-07-02 (×3): 17 g via ORAL
  Filled 2022-06-18 (×3): qty 1

## 2022-06-18 MED ORDER — ASPIRIN 300 MG RE SUPP
300.0000 mg | Freq: Once | RECTAL | Status: AC
  Administered 2022-06-18: 300 mg via RECTAL
  Filled 2022-06-18: qty 1

## 2022-06-18 MED ORDER — DICLOFENAC SODIUM 1 % EX GEL
2.0000 g | Freq: Four times a day (QID) | CUTANEOUS | Status: DC
  Administered 2022-06-18 – 2022-06-22 (×8): 2 g via TOPICAL
  Filled 2022-06-18 (×2): qty 100

## 2022-06-18 MED ORDER — ROSUVASTATIN CALCIUM 5 MG PO TABS: 5.0000 mg | ORAL_TABLET | Freq: Every day | ORAL | Status: DC

## 2022-06-18 MED ORDER — ROSUVASTATIN CALCIUM 20 MG PO TABS
20.0000 mg | ORAL_TABLET | Freq: Every day | ORAL | Status: DC
  Administered 2022-06-27 – 2022-07-11 (×15): 20 mg via ORAL
  Filled 2022-06-18 (×16): qty 1

## 2022-06-18 MED ORDER — METRONIDAZOLE 500 MG/100ML IV SOLN
500.0000 mg | Freq: Once | INTRAVENOUS | Status: AC
  Administered 2022-06-18: 500 mg via INTRAVENOUS
  Filled 2022-06-18: qty 100

## 2022-06-18 MED ORDER — BISACODYL 5 MG PO TBEC
5.0000 mg | DELAYED_RELEASE_TABLET | Freq: Every day | ORAL | Status: DC | PRN
  Administered 2022-06-29 – 2022-07-02 (×3): 5 mg via ORAL
  Filled 2022-06-18 (×3): qty 1

## 2022-06-18 MED ORDER — ESCITALOPRAM OXALATE 10 MG PO TABS
10.0000 mg | ORAL_TABLET | Freq: Every day | ORAL | Status: DC
  Administered 2022-06-27 – 2022-07-11 (×15): 10 mg via ORAL
  Filled 2022-06-18 (×16): qty 1

## 2022-06-18 MED ORDER — VANCOMYCIN HCL 2000 MG/400ML IV SOLN
2000.0000 mg | Freq: Once | INTRAVENOUS | Status: AC
  Administered 2022-06-18: 2000 mg via INTRAVENOUS
  Filled 2022-06-18: qty 400

## 2022-06-18 MED ORDER — ACETAMINOPHEN 325 MG PO TABS
650.0000 mg | ORAL_TABLET | Freq: Four times a day (QID) | ORAL | Status: DC | PRN
  Administered 2022-06-28 – 2022-07-08 (×8): 650 mg via ORAL
  Filled 2022-06-18 (×8): qty 2

## 2022-06-18 MED ORDER — POLYVINYL ALCOHOL 1.4 % OP SOLN: 1.0000 [drp] | Freq: Every day | OPHTHALMIC | Status: DC | PRN

## 2022-06-18 MED ORDER — GADOBUTROL 1 MMOL/ML IV SOLN
10.0000 mL | Freq: Once | INTRAVENOUS | Status: AC | PRN
  Administered 2022-06-18: 10 mL via INTRAVENOUS

## 2022-06-18 MED ORDER — SODIUM CHLORIDE 0.9% IV SOLUTION: Freq: Once | INTRAVENOUS | Status: DC

## 2022-06-18 MED ORDER — VANCOMYCIN HCL IN DEXTROSE 1-5 GM/200ML-% IV SOLN: 1000.0000 mg | Freq: Once | INTRAVENOUS | Status: DC

## 2022-06-18 MED ORDER — ASPIRIN 81 MG PO TBEC: 81.0000 mg | DELAYED_RELEASE_TABLET | Freq: Every day | ORAL | Status: DC

## 2022-06-18 MED ORDER — POTASSIUM CHLORIDE 10 MEQ/100ML IV SOLN
INTRAVENOUS | Status: AC
  Filled 2022-06-18: qty 100

## 2022-06-18 MED ORDER — ACETAMINOPHEN 650 MG RE SUPP
650.0000 mg | Freq: Four times a day (QID) | RECTAL | Status: DC | PRN
  Filled 2022-06-18: qty 1

## 2022-06-18 MED ORDER — ONDANSETRON HCL 4 MG/2ML IJ SOLN
4.0000 mg | Freq: Four times a day (QID) | INTRAMUSCULAR | Status: DC | PRN
  Administered 2022-06-23 – 2022-07-11 (×5): 4 mg via INTRAVENOUS
  Filled 2022-06-18 (×5): qty 2

## 2022-06-18 MED ORDER — CARVEDILOL 12.5 MG PO TABS: 25.0000 mg | ORAL_TABLET | Freq: Two times a day (BID) | ORAL | Status: DC

## 2022-06-18 MED ORDER — LOSARTAN POTASSIUM 50 MG PO TABS: 100.0000 mg | ORAL_TABLET | Freq: Every day | ORAL | Status: DC

## 2022-06-18 MED ORDER — FENTANYL CITRATE PF 50 MCG/ML IJ SOSY
12.5000 ug | PREFILLED_SYRINGE | INTRAMUSCULAR | Status: DC | PRN
  Administered 2022-06-21 – 2022-07-07 (×11): 12.5 ug via INTRAVENOUS
  Filled 2022-06-18 (×11): qty 1

## 2022-06-18 MED ORDER — ALBUTEROL SULFATE (2.5 MG/3ML) 0.083% IN NEBU: 2.5000 mg | INHALATION_SOLUTION | RESPIRATORY_TRACT | Status: DC | PRN

## 2022-06-18 MED ORDER — PREGABALIN 100 MG PO CAPS
200.0000 mg | ORAL_CAPSULE | Freq: Two times a day (BID) | ORAL | Status: DC
  Administered 2022-06-26 – 2022-07-11 (×30): 200 mg via ORAL
  Filled 2022-06-18 (×32): qty 2

## 2022-06-18 MED ORDER — OXYCODONE HCL 5 MG PO TABS
5.0000 mg | ORAL_TABLET | ORAL | Status: DC | PRN
  Administered 2022-06-21 – 2022-07-04 (×7): 5 mg via ORAL
  Filled 2022-06-18 (×7): qty 1

## 2022-06-18 MED ORDER — LORAZEPAM 2 MG/ML IJ SOLN
1.0000 mg | INTRAMUSCULAR | Status: DC | PRN
  Administered 2022-06-18: 1 mg via INTRAVENOUS
  Filled 2022-06-18 (×2): qty 1

## 2022-06-18 MED ORDER — VANCOMYCIN HCL 1500 MG/300ML IV SOLN: 1500.0000 mg | INTRAVENOUS | Status: DC

## 2022-06-18 MED ORDER — LORAZEPAM 2 MG/ML IJ SOLN
INTRAMUSCULAR | Status: AC
  Administered 2022-06-18: 1 mg via INTRAVENOUS
  Filled 2022-06-18: qty 1

## 2022-06-18 MED ORDER — MAGNESIUM SULFATE 4 GM/100ML IV SOLN
4.0000 g | Freq: Once | INTRAVENOUS | Status: AC
  Administered 2022-06-18: 4 g via INTRAVENOUS
  Filled 2022-06-18: qty 100

## 2022-06-18 MED ORDER — ACYCLOVIR 400 MG PO TABS
400.0000 mg | ORAL_TABLET | Freq: Two times a day (BID) | ORAL | Status: DC
  Administered 2022-06-22 – 2022-07-11 (×31): 400 mg via ORAL
  Filled 2022-06-18 (×46): qty 1

## 2022-06-18 MED ORDER — APIXABAN 5 MG PO TABS: 5.0000 mg | ORAL_TABLET | Freq: Two times a day (BID) | ORAL | Status: DC

## 2022-06-18 MED ORDER — SODIUM CHLORIDE 0.9 % IV SOLN
2.0000 g | Freq: Once | INTRAVENOUS | Status: AC
  Administered 2022-06-18: 2 g via INTRAVENOUS
  Filled 2022-06-18: qty 12.5

## 2022-06-18 MED ORDER — FLUTICASONE PROPIONATE 50 MCG/ACT NA SUSP
2.0000 | Freq: Every day | NASAL | Status: DC
  Administered 2022-06-19 – 2022-07-11 (×15): 2 via NASAL
  Filled 2022-06-18 (×2): qty 16

## 2022-06-18 MED ORDER — LORAZEPAM 2 MG/ML IJ SOLN: 1.0000 mg | Freq: Once | INTRAMUSCULAR | Status: AC

## 2022-06-18 NOTE — ED Triage Notes (Signed)
Pt c/o headache and lethargy that started yesterday; pt is a cancer pt and last had chemo x 2 weeks ago

## 2022-06-18 NOTE — Hospital Course (Addendum)
Brief Narrative / Interim history: 56 year old female with refractory multiple myeloma status post relapse, hypertension, gout, GERD, type 2 diabetes mellitus, migraine headaches, stroke with residual aphasia presented to ED for ongoing weakness and lethargy. Last had chemotherapy 2 weeks ago. She has recently received PRBC and platelets in the cancer center. While in the ED patient had acute mental status change, unarousable with full tonic-clonic seizure lasting 2 to 3 minutes which improved with lorazepam. Transferred to [facility] for further neuro evaluation and follow-up as well as continuous EEG.  She has been placed on multiple antiepileptics, now some AEDs have been discontinued or are being tapered down as below under guidance from neurology. Transitioned to oral phenobarbital, Vimpat, Keppra as of 12/23. Bit more alert 12/24 through 12/26 but not oriented except to person, place.       Consultants:  Neurology  Oncology   Procedures: None         Assesement and Plan:  Principal Problem:   Generalized weakness Active Problems:   Multiple myeloma (HCC)   Hyperlipidemia   Acute on chronic anemia   Leukopenia   Stroke (cerebrum) -Left parietal lobe, 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    Chronic pain disorder   LVH (left ventricular hypertrophy)   Morbid obesity with BMI of 45.0-49.9, adult (HCC)   Drug-induced polyneuropathy (HCC)   History of CVA with residual deficit   Overactive bladder   Subclinical hypothyroidism   Hypokalemia   Grand mal seizure ([facility])   Hypomagnesemia   Status epilepticus (HCC)       New Onset Partial Status Epilepticus in the setting of underlying CVA, acute metabolic encephalopathy  MRI on admission showed a 2 mm acute white matter infarct lateral to the right caudate head, and also tiny subacute infarcts in the anterior right corona radiata and left occipital lobe white matter.   EEG was concerning for possible  seizures.   Workup with a 2D echo showed normal EF 65 to 16%, grade 1 diastolic dysfunction, bubble study was negative, A1c 7.2.  neurology team is following converted convert IV antiepileptics to p.o. -->  Transitioned to oral phenobarbital, Vimpat, Keppra as of 12/23.  Continue aspirin and statin for stroke.     Will need rehab SNF Outpatient plan: F/u with neuro in 3 months. May consider repeat MRI brain at that time if still has language deficits   Pt remains fairly somnolent/confused, asked neurology team to reassess meds 07/03/22 - Dr Hortense Ramal will see pt tomorrow    Abdominal pain - secondary to nephrolithiasis(nonobstructive) Incidental liver lesions noted  Patient indicates her previous flank pain is consistent with her history of nephrolithiasis, CTA notable for nonobstructing nephrolithasis -per husband these are somewhat chronic.   Abdominal imaging incidentally showed multiple liver lesions, which will require an MRI as an outpatient.   Hypomagnesemia, hypokalemia  continue to monitor Mg improved but low today, replete again and recheck in the morning, start on po supplement     Pancytopenia  Secondary to myeloma platelets gradually improving and are overall stable.   Received a total of 3 units of packed red blood cells, last one 12/20.   Hemoglobin and platelets are stable this morning but slightly lower than yesterday Monitor CBC Transfuse as needed   Refractory Multiple Myeloma  Followed by Dr. Delton Coombes.  Last chemo given 2 weeks prior to admit          DVT prophylaxis: SCD Pertinent IV fluids/nutrition: dysphagia 2 diet per SLP  eval 12/22 Central lines / invasive devices: R chest port   Code Status: FULL CODE   Disposition: inpatient  TOC needs: SNF placement Barriers to discharge / significant pending items: placement, mentation improvement, neurology clearance for discharge, may be able to go next few days

## 2022-06-18 NOTE — ED Notes (Signed)
Date and time results received: 06/18/22 12:56 PM  (use smartphrase ".now" to insert current time)  Test: Potassium  Critical Value: 2.5   Name of Provider Notified: Dr. Roderic Palau   Orders Received? Or Actions Taken?:  Awaiting new orders

## 2022-06-18 NOTE — Consult Note (Signed)
[facility] TeleNeurology Consult Services  Stat Consult  Patient Name:   Joanna Reid, Joanna Reid Date of Birth:   1965-07-15 Identification Number:   MRN - [id number] Date of Service:   06/18/2022 20:55:32  Diagnosis:       I63.9 - Cerebrovascular accident (CVA), unspecified mechanism ([facility])  Impression GCS 8 (E3V1M4) Left hemiparesis, aphasia, new infarct caudate head most likely cardioembolic.  Patient's NIHSS was 27 CT head did not show any acute intracranial hemorrhage. IV thrombolytics (tPA or tNK): Not a candidate because outside the window with hx of recent strokes.     Recommendations: Our recommendations are outlined below.  Diagnostic Studies : Routine EEG Please order 2D echo to check Ejection fraction, wall motion defects, intracardiac thrombi and PFO  Laboratory Studies : Lipid panel Please order Hemoglobin A1c Please order TSH Please order  Antithrombotic Medication : Aspirin 300 mg PR daily Please order Permissive hypertension, Antihypertensives with prn for first 24-48 hrs post stroke onset. If BP greater than 220/120 give Labetalol IV or Vasotec IV Please order Statins for LDL goal less than 70 Please order  Anticoagulant Medication : Hold all anticoagulation  Nursing Recommendations : IV Fluids, avoid dextrose containing fluids, Maintain euglycemiaNeuro checks q4 hrs x 24 hrs and then per shiftHead of bed 30 degreesContinue with Telemetry  Consultations : Recommend Speech therapy if failed dysphagia screenPhysical therapy/Occupational therapy  DVT Prophylaxis : Choice of Primary Team  Disposition : Neurology will follow   ----------------------------------------------------------------------------------------------------   Advanced Imaging: CTA Head and Neck Completed.  LVO:No  Patient in not a candidate for NIR    Metrics: [facility] Notification Time: 06/18/2022 20:55:32 Stamp Time: 06/18/2022  20:55:32 Callback Response Time: 06/18/2022 20:56:46  Primary Provider Notified of Diagnostic Impression and Management Plan on: 06/18/2022 22:00:25   CT HEAD: Reviewed   Imaging MRI brain: 1. Intermittently motion degraded exam. 2. Foci of cortical diffusion-weighted signal abnormality along the margins of a chronic left parietal lobe infarct (within the left parietal lobe, at the left temporoparietal junction and within the lateral left occipital lobe). Subtle diffusion-weighted signal abnormality is also suspected within the posteromedial left thalamus. These findings are suspicious for seizure-related changes. However, alternatively, these may reflect acute infarcts. 3. New 2 mm acute white matter infarct immediately lateral to the right caudate head. 4. Redemonstrated tiny subacute infarcts within the anterior right corona radiata and left occipital lobe white matter (right MCA and left PCA vascular territories, respectively). 5. Background chronic small vessel ischemic changes, as described and unchanged from the recent prior brain MRI of 06/12/2022. 6. Redemonstrated small chronic infarcts within both cerebellar hemispheres. 7. Widespread multiple myeloma.  MRA head:  1. No intracranial large vessel occlusion is identified. 2. Intracranial atherosclerotic disease with multifocal stenoses, most notably as follows. 3. Moderate/severe stenosis within the right ACA proximal A2 segment, new from the prior CTA head/neck of 12/26/2019. 4. Sites of up to moderate stenosis within left PCA branches at the P2/P3 junction, progressed. 5. Moderate stenosis within a right PCA branch at the P2/P3 junction, progressed.     ----------------------------------------------------------------------------------------------------  Chief Complaint: New infarct on MRI  History of Present Illness: Patient is a 55 year old Female. Patient is at 56 year old woman with medical history  skin for refractory multiple myeloma, HTN, DM, gout, migraine, headaches and stroke with residual aphasia presents today with lethargy, fatigue, and generalized weakness. It was reported last night approximately two days ago. she presented today. It was also reported that she had seizure activity witnessed by ED  staff. Patient was low on Keppra. As of note, patient GCS 8 E3V1M4 MRI brain, demonstrating chronic, subacute and new infarct lateral to the right cottage head. Previous and forks demonstrated on left pal low right into your right corner, radiata and left oxy lobe. These multiple inks concerning for possible cardioembolic etiology.    Past Medical History:      Hypertension      Diabetes Mellitus      Atrial Fibrillation      Stroke Other PMH:  multiple myeloma, unable to obtain due to:   Patient Cannot Speak  Medications:  No Anticoagulant use  Antiplatelet use: Yes ASA 64m Reviewed EMR for current medications  Allergies:   Allergies Unable To Obtain Due To: Patient Cannot Speak  Social History: Patient Is: Married Unable To Obtain Due To Patient Status : Patient Cannot Speak  Family History:  Family History Cannot Be Obtained Because:Patient Cannot Speak  ROS : ROS Cannot Be Obtained Because:  Patient Cannot Speak  Past Surgical History: Past Surgical History Cannot Be Obtained Because: Patient Cannot Speak There Is No Surgical History Contributory To Today's Visit  NIHSS may not be reliable due to: [id number]: BP(158/95), Pulse(103), Blood Glucose(122) 1A: Level of Consciousness - Requires repeated stimulation to arouse + 2 1B: Ask Month and Age - Aphasic + 2 1C: Blink Eyes & Squeeze Hands - Performs Both Tasks + 0 2: Test Horizontal Extraocular Movements - Forced Gaze Palsy: Cannot Be Overcome + 2 3: Test Visual Fields - Complete Hemianopia + 2 4: Test Facial Palsy (Use [place] if Obtunded) - Normal symmetry + 0 5A: Test Left Arm Motor Drift - No  Drift for 10 Seconds + 0 5B: Test Right Arm Motor Drift - No Movement + 4 6A: Test Left Leg Motor Drift - No Movement + 4 6B: Test Right Leg Motor Drift - No Movement + 4 7: Test Limb Ataxia (FNF/Heel-Shin) - Does Not Understand + 0 8: Test Sensation - Mild-Moderate Loss: Can Sense Being Touched + 1 9: Test Language/Aphasia - Mute/Global Aphasia: No Usable Speech/Auditory Comprehension + 3 10: Test Dysarthria - Mute/Anarthric + 2 11: Test Extinction/Inattention - Extinction to bilateral simultaneous stimulation + 1  NIHSS Score: 276 Spoke with : Dr. MEncarnacion Chu   Patient / Family was informed the Neurology Consult would occur via TeleHealth consult by way of interactive audio and video telecommunications and consented to receiving care in this manner.  Patient is being evaluated for possible acute neurologic impairment and high probability of imminent or life - threatening deterioration.I spent total of 35 minutes providing care to this patient, including time for face to face visit via telemedicine, review of medical records, imaging studies and discussion of findings with providers, the patient and / or family.   Dr PEllis Savage  [facility] For Inpatient follow-up with [facility] physician please call [facility] [phone] This is not an outpatient service. Post hospital discharge, please contact hospital directly.  Please do not communicate with [facility] physicians via secure chat. If you have any questions, Please contact [facility].

## 2022-06-18 NOTE — ED Provider Notes (Signed)
[facility] EMERGENCY DEPARTMENT Provider Note   CSN: [id number] Arrival date & time: 06/18/22  1103     History {Add pertinent medical, surgical, social history, OB history to HPI:1} Chief Complaint  Patient presents with   Weakness    Joanna Reid is a 57 y.o. female.  Patient with multiple myeloma.  She presents very weak and mildly lethargic   Weakness      Home Medications Prior to Admission medications   Medication Sig Start Date End Date Taking? Authorizing Provider  acyclovir (ZOVIRAX) 400 MG tablet TAKE (1) TABLET BY MOUTH TWICE DAILY. 03/01/22  Yes Derek Jack, MD  albuterol (VENTOLIN HFA) 108 (90 Base) MCG/ACT inhaler Inhale 2 puffs into the lungs every 4 (four) hours as needed for wheezing or shortness of breath. 04/16/22  Yes Emokpae, Courage, MD  allopurinol (ZYLOPRIM) 300 MG tablet TAKE (1) TABLET BY MOUTH ONCE DAILY. 05/28/22  Yes Lindell Spar, MD  amLODipine (NORVASC) 2.5 MG tablet Take 2.5 mg by mouth daily. 01/27/21  Yes [provider]  aspirin EC 81 MG tablet Take 1 tablet (81 mg total) by mouth daily with breakfast. For stroke prophylaxis 04/16/22  Yes Emokpae, Courage, MD  bumetanide (BUMEX) 0.5 MG tablet Take 0.5 mg by mouth daily.  06/30/19  Yes [provider]  carvedilol (COREG) 25 MG tablet Take 1 tablet (25 mg total) by mouth 2 (two) times daily. 12/29/19  Yes Thurnell Lose, MD  dexamethasone (DECADRON) 4 MG tablet Take 5 tablets (20 mg total) by mouth 2 (two) times a week. Take on day 1 and day 3 along with Xpovio. 05/10/22  Yes Derek Jack, MD  diclofenac Sodium (VOLTAREN) 1 % GEL Apply 2 g topically 4 (four) times daily. 05/01/21  Yes Caccavale, Sophia, PA-C  docusate sodium (COLACE) 100 MG capsule Take 100 mg by mouth daily.    Yes [provider]  doxycycline (VIBRAMYCIN) 100 MG capsule Take 1 capsule (100 mg total) by mouth 2 (two) times daily. 06/08/22  Yes [name], MD  Dulaglutide 1.5  MG/0.5ML SOPN Inject 1.5 mg into the skin every 7 (seven) days.   Yes [provider]  escitalopram (LEXAPRO) 10 MG tablet TAKE (1) TABLET BY MOUTH ONCE DAILY. MAY START WITH 1/2 TABLET FOR 7 DAYS. 05/03/22  Yes Lindell Spar, MD  fluticasone [facility]) 50 MCG/ACT nasal spray INSTILL 2 SPRAYS INTO BOTH NOSTRILS DAILY 06/28/21  Yes Lindell Spar, MD  Insulin Lispro w/ Trans Port 100 UNIT/ML SOPN Inject 20 Units into the skin See admin instructions. Inject before meals per sliding scale Max TDD 20 units 01/31/22  Yes [provider]  lidocaine-prilocaine (EMLA) cream Apply 1 Application topically as needed (Apply to port prior to treatment and flushes). 01/24/22  Yes Derek Jack, MD  losartan (COZAAR) 100 MG tablet Take 100 mg by mouth daily.   Yes [provider]  magnesium oxide (MAG-OX) 400 (241.3 Mg) MG tablet Take 1 tablet (400 mg total) by mouth 3 (three) times daily. 10/02/18  Yes Derek Jack, MD  metFORMIN (GLUCOPHAGE) 500 MG tablet Take 1 tablet (500 mg total) by mouth 2 (two) times daily. 04/16/22 09/10/22 Yes Emokpae, Courage, MD  metoCLOPramide (REGLAN) 10 MG tablet Take 1 tablet (10 mg total) by mouth every 8 (eight) hours as needed for nausea. 05/03/22  Yes Derek Jack, MD  nitrofurantoin, macrocrystal-monohydrate, (MACROBID) 100 MG capsule TAKE (1) CAPSULE BY MOUTH AT BEDTIME. Patient taking differently: Take 100 mg by mouth at bedtime.  03/14/22  Yes Derek Jack, MD  oxyCODONE-acetaminophen (PERCOCET/ROXICET) 5-325 MG tablet Take 1 tablet by mouth 2 (two) times daily as needed for severe pain. 05/29/22  Yes Derek Jack, MD  pantoprazole (PROTONIX) 40 MG tablet TAKE (1) TABLET BY MOUTH ONCE DAILY. Patient taking differently: Take 40 mg by mouth daily. 01/17/22  Yes Lindell Spar, MD  polyethylene glycol (MIRALAX / GLYCOLAX) 17 g packet Take 17 g by mouth daily as needed for mild constipation. 04/16/22  Yes Emokpae, Courage,  MD  pregabalin (LYRICA) 200 MG capsule TAKE (1) CAPSULE BY MOUTH TWICE DAILY. Patient taking differently: Take 200 mg by mouth 2 (two) times daily. 01/25/22  Yes Derek Jack, MD  Propylene Glycol (SYSTANE BALANCE) 0.6 % SOLN Apply 1 drop to eye daily as needed (dry eye).    Yes [provider]  rosuvastatin (CRESTOR) 5 MG tablet Take 1 tablet (5 mg total) by mouth daily. 04/16/22  Yes Emokpae, Courage, MD  scopolamine (TRANSDERM-SCOP) 1 MG/3DAYS Place 1 patch (1.5 mg total) onto the skin every 3 (three) days. 03/14/22  Yes Derek Jack, MD  selinexor (XPOVIO) Therapy Pack (80 mg twice weekly) Take 4 tablets (80 mg total) by mouth 2 (two) times a week. Take on days 1 and 3 of each week. 05/07/22  Yes Derek Jack, MD  traMADol (ULTRAM) 50 MG tablet Take 1 tablet (50 mg total) by mouth every 8 (eight) hours as needed for severe pain. 04/24/22  Yes Derek Jack, MD  valACYclovir (VALTREX) 1000 MG tablet Take 1 tablet (1,000 mg total) by mouth See admin instructions. TAKE 2 TABLETS BY MOUTH NOW; THEN 2 tab about 12 HOURS LATER--- for outbreaks 04/16/22  Yes Emokpae, Courage, MD  BD PEN NEEDLE NANO U/F 32G X 4 MM MISC Inject into the skin. 01/31/22   [provider]  DARZALEX FASPRO 1800-30000 MG-UT/15ML SOLN Inject 15 ml over 3-5 minutes subcutaneously once every 2 weeks. Patient not taking: Reported on 06/18/2022 03/27/22   Derek Jack, MD  KYPROLIS 30 MG SOLR Inject into the vein. Patient not taking: Reported on 06/18/2022 01/29/22   [provider]  Lactulose 20 GM/30ML SOLN Take 30 mLs (20 g total) by mouth daily as needed. Patient not taking: Reported on 06/18/2022 04/16/22   Roxan Hockey, MD  LORazepam (ATIVAN) 0.5 MG tablet TAKE (1) TABLET BY MOUTH EVERY EIGHT HOURS Patient not taking: Reported on 06/18/2022 05/03/22   Derek Jack, MD      Allergies    Patient has no known allergies.    Review of Systems   Review of  Systems  Neurological:  Positive for weakness.    Physical Exam Updated Vital Signs BP 129/82   Pulse 76   Temp 98.2 F (36.8 C)   Resp 15   Ht _0  (1.626 m)   Wt 107 kg   SpO2 96%   BMI 40.49 kg/m  Physical Exam  ED Results / Procedures / Treatments   Labs (all labs ordered are listed, but only abnormal results are displayed) Labs Reviewed  LACTIC ACID, PLASMA - Abnormal; Notable for the following components:      Result Value   Lactic Acid, Venous 2.0 (*)    All other components within normal limits  COMPREHENSIVE METABOLIC PANEL - Abnormal; Notable for the following components:   Potassium 2.5 (*)    Glucose, Bld 110 (*)    BUN 21 (*)    Calcium 8.0 (*)    Total Protein 5.2 (*)  Albumin 2.5 (*)    Alkaline Phosphatase 165 (*)    Total Bilirubin 1.8 (*)    All other components within normal limits  CBC WITH DIFFERENTIAL/PLATELET - Abnormal; Notable for the following components:   WBC 2.6 (*)    RBC 2.58 (*)    Hemoglobin 7.7 (*)    HCT 22.5 (*)    RDW 18.0 (*)    Platelets 15 (*)    nRBC 0.8 (*)    Neutro Abs 1.3 (*)    Abs Immature Granulocytes 0.10 (*)    All other components within normal limits  PROTIME-INR - Abnormal; Notable for the following components:   Prothrombin Time 15.7 (*)    INR 1.3 (*)    All other components within normal limits  APTT - Abnormal; Notable for the following components:   aPTT 39 (*)    All other components within normal limits  RESP PANEL BY RT-PCR (FLU A&B, COVID) ARPGX2  CULTURE, BLOOD (ROUTINE X 2)  CULTURE, BLOOD (ROUTINE X 2)  URINE CULTURE  LACTIC ACID, PLASMA  URINALYSIS, ROUTINE W REFLEX MICROSCOPIC  MAGNESIUM  POC URINE PREG, ED  TYPE AND SCREEN  PREPARE RBC (CROSSMATCH)  PREPARE RBC (CROSSMATCH)    EKG None  Radiology DG Chest Port 1 View  Result Date: 06/18/2022 CLINICAL DATA:  Possible sepsis EXAM: PORTABLE CHEST 1 VIEW COMPARISON:  06/08/22 CXR FINDINGS: Right chest part with the tip at the  cavoatrial junction. Unchanged cardiac and mediastinal contours. No pleural effusion. No pneumothorax. Bibasilar atelectasis. Visualized upper abdomen is unremarkable.No displaced rib fractures. IMPRESSION: Bibasilar atelectasis. Electronically Signed   By: Marin Roberts M.D.   On: 06/18/2022 12:12    Procedures Procedures  {Document cardiac monitor, telemetry assessment procedure when appropriate:1}  Medications Ordered in ED Medications  ceFEPIme (MAXIPIME) 2 g in sodium chloride 0.9 % 100 mL IVPB (has no administration in time range)  vancomycin (VANCOREADY) IVPB 1500 mg/300 mL (has no administration in time range)  potassium chloride 10 mEq in 100 mL IVPB (has no administration in time range)  potassium chloride 10 mEq in 100 mL IVPB (has no administration in time range)  0.9 %  sodium chloride infusion (Manually program via Guardrails IV Fluids) (has no administration in time range)  0.9 %  sodium chloride infusion (Manually program via Guardrails IV Fluids) (has no administration in time range)  ceFEPIme (MAXIPIME) 2 g in sodium chloride 0.9 % 100 mL IVPB (0 g Intravenous Stopped 06/18/22 1304)  metroNIDAZOLE (FLAGYL) IVPB 500 mg (0 mg Intravenous Stopped 06/18/22 1431)  sodium chloride 0.9 % bolus 2,000 mL (2,000 mLs Intravenous New Bag/Given 06/18/22 1244)  vancomycin (VANCOREADY) IVPB 2000 mg/400 mL (0 mg Intravenous Stopped 06/18/22 1451)    ED Course/ Medical Decision Making/ A&P  CRITICAL CARE Performed by: Milton Ferguson Total critical care time: 40 minutes Critical care time was exclusive of separately billable procedures and treating other patients. Critical care was necessary to treat or prevent imminent or life-threatening deterioration. Critical care was time spent personally by me on the following activities: development of treatment plan with patient and/or surrogate as well as nursing, discussions with consultants, evaluation of patient's response to treatment,  examination of patient, obtaining history from patient or surrogate, ordering and performing treatments and interventions, ordering and review of laboratory studies, ordering and review of radiographic studies, pulse oximetry and re-evaluation of patient's condition.   I spoke with the oncologist admitting the patient to the hospital along with treating dehydration and hypokalemia  and anemia.  Oncology will consult tomorrow if they are consulted by the hospitalist                         Medical Decision Making Amount and/or Complexity of Data Reviewed Labs: ordered. Radiology: ordered. ECG/medicine tests: ordered.  Risk Prescription drug management. Decision regarding hospitalization.   Patient will be admitted for hypokalemia, dehydration, anemia  {Document critical care time when appropriate:1} {Document review of labs and clinical decision tools ie heart score, Chads2Vasc2 etc:1}  {Document your independent review of radiology images, and any outside records:1} {Document your discussion with family members, caretakers, and with consultants:1} {Document social determinants of health affecting pt's care:1} {Document your decision making why or why not admission, treatments were needed:1} Final Clinical Impression(s) / ED Diagnoses Final diagnoses:  Hypokalemia    Rx / DC Orders ED Discharge Orders     None

## 2022-06-18 NOTE — Progress Notes (Signed)
Paged and sent a secured chat to Dr. Argie Ramming.   Radiology called and wanted to let attending know MRI results are ready for review and require his attention.

## 2022-06-18 NOTE — H&P (Signed)
History and Physical  [facility]  Joanna Reid XBM:[id number] DOB: 09/28/1965 DOA: 06/18/2022  PCP: Lindell Spar, MD  Patient coming from: Home  Level of care: Stepdown  I have personally briefly reviewed patient's old medical records in [place]  Chief Complaint: Weakness   HPI: Joanna Reid is a 56 year old female with refractory multiple myeloma status post relapse, hypertension, gout, GERD, type 2 diabetes mellitus, migraine headaches, stroke with residual aphasia presented to ED today due to ongoing weakness and lethargy.  She reportedly last had chemotherapy 2 weeks ago.  She has recently received PRBC and platelets in the cancer center.   Her work up revealed a K of 2.5, Hg was 7.7, WBC 2.6, platelets 15.  She noted no spontaneous bleeding.   While I was evaluating patient in the ED she was noted to become acutely somnolent and then eyes rolled in back of head and she had a full tonic clonic seizure lasting about 2 to 3 mins.  I called for emergency lorazepam 1 mg IV and after that was given it subsided and she was in a post-ictal state. She bit her tongue and there was some bleeding from mouth.    I notified the ER providers.  I ordered stat MRI brain.  I ordered keppra IV.  I changed bed request to stepdown ICU.   I asked spouse about code status but he was too upset to give me an answer and he walked out of room.  Follow up on MRI and will ask for neurology consultation.  Pt is being admitted for further management.      Past Medical History:  Diagnosis Date   Acid reflux    Allergic rhinitis    Cancer ([place])    multiple myeloma   Diabetes mellitus    type 2   Gout    Gout    H/o COVID-19--- was Positive 09/17/2019, Negative 12/23/19 AND also Neg on  12/26/19 12/26/2019   HBP (high blood pressure)    History of kidney stones    Migraines    Multiple myeloma ([place]) 10/29/2017    Past Surgical History:  Procedure Laterality Date   BREAST CYST  EXCISION Left    2009 no visible scar on skin   CESAREAN SECTION     COLONOSCOPY WITH PROPOFOL N/A 12/25/2019   Procedure: COLONOSCOPY WITH PROPOFOL;  Surgeon: Rogene Houston, MD;  [facility];  Service: Endoscopy;  Laterality: N/A;  730   EXTRACORPOREAL SHOCK WAVE LITHOTRIPSY Left 10/10/2017   Procedure: LEFT EXTRACORPOREAL SHOCK WAVE LITHOTRIPSY (ESWL);  Surgeon: Bjorn Loser, MD;  Location: [facility];  Service: Urology;  Laterality: Left;   EYE SURGERY     HEMORRHOID SURGERY N/A 11/19/2012   Procedure: HEMORRHOIDECTOMY;  Surgeon: Jamesetta So, MD;  Location: [facility] ORS;  Service: General;  Laterality: N/A;   IR IMAGING GUIDED PORT INSERTION  01/18/2022   kidney stones  1998   LAPAROSCOPIC UNILATERAL SALPINGO OOPHERECTOMY  05/14/2012   Procedure: LAPAROSCOPIC UNILATERAL SALPINGO OOPHORECTOMY;  Surgeon: [name], MD;  Location: [facility];  Service: Gynecology;  Laterality: Right;  laparoscopic right salpingo-oophorectomy   PARTIAL HYSTERECTOMY     POLYPECTOMY  12/25/2019   Procedure: POLYPECTOMY;  Surgeon: Rogene Houston, MD;  [facility];  Service: Endoscopy;;   TONSILECTOMY, ADENOIDECTOMY, BILATERAL MYRINGOTOMY AND TUBES     VESICOVAGINAL FISTULA CLOSURE W/ TAH       reports that she quit smoking about  23 years ago. Her smoking use included cigarettes. She has never used smokeless tobacco. She reports that she does not drink alcohol and does not use drugs.  No Known Allergies  Family History  Problem Relation Age of Onset   Arthritis Other    Cancer Other    Diabetes Other    Hypertension Mother    Dementia Mother    Diabetes Father    ALS Father    Diabetes Brother    Hypertension Brother    Cancer Paternal Aunt    COPD Maternal Grandmother    Cancer Maternal Grandfather    Anesthesia problems Paternal Grandfather     Prior to Admission medications   Medication Sig Start Date End Date Taking? Authorizing Provider  acyclovir (ZOVIRAX) 400 MG  tablet TAKE (1) TABLET BY MOUTH TWICE DAILY. 03/01/22  Yes Derek Jack, MD  albuterol (VENTOLIN HFA) 108 (90 Base) MCG/ACT inhaler Inhale 2 puffs into the lungs every 4 (four) hours as needed for wheezing or shortness of breath. 04/16/22  Yes Emokpae, Courage, MD  allopurinol (ZYLOPRIM) 300 MG tablet TAKE (1) TABLET BY MOUTH ONCE DAILY. 05/28/22  Yes Lindell Spar, MD  amLODipine (NORVASC) 2.5 MG tablet Take 2.5 mg by mouth daily. 01/27/21  Yes [provider]  aspirin EC 81 MG tablet Take 1 tablet (81 mg total) by mouth daily with breakfast. For stroke prophylaxis 04/16/22  Yes Emokpae, Courage, MD  bumetanide (BUMEX) 0.5 MG tablet Take 0.5 mg by mouth daily.  06/30/19  Yes [provider]  carvedilol (COREG) 25 MG tablet Take 1 tablet (25 mg total) by mouth 2 (two) times daily. 12/29/19  Yes Thurnell Lose, MD  dexamethasone (DECADRON) 4 MG tablet Take 5 tablets (20 mg total) by mouth 2 (two) times a week. Take on day 1 and day 3 along with Xpovio. 05/10/22  Yes Derek Jack, MD  diclofenac Sodium (VOLTAREN) 1 % GEL Apply 2 g topically 4 (four) times daily. 05/01/21  Yes Caccavale, Sophia, PA-C  docusate sodium (COLACE) 100 MG capsule Take 100 mg by mouth daily.    Yes [provider]  doxycycline (VIBRAMYCIN) 100 MG capsule Take 1 capsule (100 mg total) by mouth 2 (two) times daily. 06/08/22  Yes [name], MD  Dulaglutide 1.5 MG/0.5ML SOPN Inject 1.5 mg into the skin every 7 (seven) days.   Yes [provider]  escitalopram (LEXAPRO) 10 MG tablet TAKE (1) TABLET BY MOUTH ONCE DAILY. MAY START WITH 1/2 TABLET FOR 7 DAYS. 05/03/22  Yes Lindell Spar, MD  fluticasone [facility]) 50 MCG/ACT nasal spray INSTILL 2 SPRAYS INTO BOTH NOSTRILS DAILY 06/28/21  Yes Lindell Spar, MD  Insulin Lispro w/ Trans Port 100 UNIT/ML SOPN Inject 20 Units into the skin See admin instructions. Inject before meals per sliding scale Max TDD 20 units 01/31/22  Yes  [provider]  lidocaine-prilocaine (EMLA) cream Apply 1 Application topically as needed (Apply to port prior to treatment and flushes). 01/24/22  Yes Derek Jack, MD  losartan (COZAAR) 100 MG tablet Take 100 mg by mouth daily.   Yes [provider]  magnesium oxide (MAG-OX) 400 (241.3 Mg) MG tablet Take 1 tablet (400 mg total) by mouth 3 (three) times daily. 10/02/18  Yes Derek Jack, MD  metFORMIN (GLUCOPHAGE) 500 MG tablet Take 1 tablet (500 mg total) by mouth 2 (two) times daily. 04/16/22 09/10/22 Yes Emokpae, Courage, MD  metoCLOPramide (REGLAN) 10 MG tablet Take 1 tablet (10 mg total) by  mouth every 8 (eight) hours as needed for nausea. 05/03/22  Yes Derek Jack, MD  nitrofurantoin, macrocrystal-monohydrate, (MACROBID) 100 MG capsule TAKE (1) CAPSULE BY MOUTH AT BEDTIME. Patient taking differently: Take 100 mg by mouth at bedtime. 03/14/22  Yes Derek Jack, MD  oxyCODONE-acetaminophen (PERCOCET/ROXICET) 5-325 MG tablet Take 1 tablet by mouth 2 (two) times daily as needed for severe pain. 05/29/22  Yes Derek Jack, MD  pantoprazole (PROTONIX) 40 MG tablet TAKE (1) TABLET BY MOUTH ONCE DAILY. Patient taking differently: Take 40 mg by mouth daily. 01/17/22  Yes Lindell Spar, MD  polyethylene glycol (MIRALAX / GLYCOLAX) 17 g packet Take 17 g by mouth daily as needed for mild constipation. 04/16/22  Yes Emokpae, Courage, MD  pregabalin (LYRICA) 200 MG capsule TAKE (1) CAPSULE BY MOUTH TWICE DAILY. Patient taking differently: Take 200 mg by mouth 2 (two) times daily. 01/25/22  Yes Derek Jack, MD  Propylene Glycol (SYSTANE BALANCE) 0.6 % SOLN Apply 1 drop to eye daily as needed (dry eye).    Yes [provider]  rosuvastatin (CRESTOR) 5 MG tablet Take 1 tablet (5 mg total) by mouth daily. 04/16/22  Yes Emokpae, Courage, MD  scopolamine (TRANSDERM-SCOP) 1 MG/3DAYS Place 1 patch (1.5 mg total) onto the skin every 3 (three) days.  03/14/22  Yes Derek Jack, MD  selinexor (XPOVIO) Therapy Pack (80 mg twice weekly) Take 4 tablets (80 mg total) by mouth 2 (two) times a week. Take on days 1 and 3 of each week. 05/07/22  Yes Derek Jack, MD  traMADol (ULTRAM) 50 MG tablet Take 1 tablet (50 mg total) by mouth every 8 (eight) hours as needed for severe pain. 04/24/22  Yes Derek Jack, MD  valACYclovir (VALTREX) 1000 MG tablet Take 1 tablet (1,000 mg total) by mouth See admin instructions. TAKE 2 TABLETS BY MOUTH NOW; THEN 2 tab about 12 HOURS LATER--- for outbreaks 04/16/22  Yes Emokpae, Courage, MD  BD PEN NEEDLE NANO U/F 32G X 4 MM MISC Inject into the skin. 01/31/22   [provider]  Lactulose 20 GM/30ML SOLN Take 30 mLs (20 g total) by mouth daily as needed. Patient not taking: Reported on 06/18/2022 04/16/22   Roxan Hockey, MD  LORazepam (ATIVAN) 0.5 MG tablet TAKE (1) TABLET BY MOUTH EVERY EIGHT HOURS Patient not taking: Reported on 06/18/2022 05/03/22   Derek Jack, MD    Physical Exam: Vitals:   06/18/22 1415 06/18/22 1430 06/18/22 1500 06/18/22 1530  BP:  (!) 140/106 (!) 158/91 (!) 153/92  Pulse: 73 75 76 94  Resp: 19 (!) 21 17 (!) 29  Temp: 98.4 F (36.9 C) 98.4 F (36.9 C) 98.2 F (36.8 C) 98.6 F (37 C)  TempSrc:      SpO2: 97% 99% 95% 98%  Weight:      Height:       Constitutional: pt is nonverbal, somnolent, hard to arouse, not following commands. Gurgling from lips.  Eyes: PERRL, lids and conjunctivae normal ENMT: Mucous membranes are pale. Tongue injury seen;  Neck: normal, supple, no masses, no thyromegaly Respiratory: shallow breathing seen.   Cardiovascular: normal s1, s2 sounds, tachycardic rate, no murmurs / rubs / gallops. No extremity edema. 2+ pedal pulses. No carotid bruits.  Abdomen: obese, soft, no masses palpated. No hepatosplenomegaly. Bowel sounds positive.  Musculoskeletal: no clubbing / cyanosis. No joint deformity upper and lower  extremities. Good ROM, no contractures. Normal muscle tone.  Skin: no rashes, lesions, ulcers. No induration Neurologic: spontaneous jerking movements  seen;   Psychiatric: UTD judgment and insight.   Labs on Admission: I have personally reviewed following labs and imaging studies  CBC: Recent Labs  Lab 06/18/22 1217  WBC 2.6*  NEUTROABS 1.3*  HGB 7.7*  HCT 22.5*  MCV 87.2  PLT 15*   Basic Metabolic Panel: Recent Labs  Lab 06/18/22 1217 06/18/22 1523  NA 136  --   K 2.5*  --   CL 100  --   CO2 25  --   GLUCOSE 110*  --   BUN 21*  --   CREATININE 0.60  --   CALCIUM 8.0*  --   MG  --  0.8*   GFR: Estimated Creatinine Clearance: 92.6 mL/min (by C-G formula based on SCr of 0.6 mg/dL). Liver Function Tests: Recent Labs  Lab 06/18/22 1217  AST 25  ALT 15  ALKPHOS 165*  BILITOT 1.8*  PROT 5.2*  ALBUMIN 2.5*   No results for input(s): "LIPASE", "AMYLASE" in the last 168 hours. No results for input(s): "AMMONIA" in the last 168 hours. Coagulation Profile: Recent Labs  Lab 06/18/22 1217  INR 1.3*   Cardiac Enzymes: No results for input(s): "CKTOTAL", "CKMB", "CKMBINDEX", "TROPONINI" in the last 168 hours. BNP (last 3 results) No results for input(s): "PROBNP" in the last 8760 hours. HbA1C: No results for input(s): "HGBA1C" in the last 72 hours. CBG: No results for input(s): "GLUCAP" in the last 168 hours. Lipid Profile: No results for input(s): "CHOL", "HDL", "LDLCALC", "TRIG", "CHOLHDL", "LDLDIRECT" in the last 72 hours. Thyroid Function Tests: No results for input(s): "TSH", "T4TOTAL", "FREET4", "T3FREE", "THYROIDAB" in the last 72 hours. Anemia Panel: No results for input(s): "VITAMINB12", "FOLATE", "FERRITIN", "TIBC", "IRON", "RETICCTPCT" in the last 72 hours. Urine analysis:    Component Value Date/Time   COLORURINE YELLOW 04/12/2022 2107   APPEARANCEUR Cloudy (A) 05/02/2022 1113   LABSPEC 1.040 (H) 04/12/2022 2107   PHURINE 6.0 04/12/2022 2107    GLUCOSEU Negative 05/02/2022 1113   HGBUR NEGATIVE 04/12/2022 2107   BILIRUBINUR Negative 05/02/2022 [place] 04/12/2022 2107   PROTEINUR Negative 05/02/2022 1113   PROTEINUR 100 (A) 04/12/2022 2107   UROBILINOGEN 0.2 11/27/2021 1534   UROBILINOGEN 0.2 05/07/2012 0829   NITRITE Negative 05/02/2022 1113   NITRITE NEGATIVE 04/12/2022 2107   LEUKOCYTESUR Trace (A) 05/02/2022 1113   LEUKOCYTESUR NEGATIVE 04/12/2022 2107    Radiological Exams on Admission: DG Chest Port 1 View  Result Date: 06/18/2022 CLINICAL DATA:  Possible sepsis EXAM: PORTABLE CHEST 1 VIEW COMPARISON:  06/08/22 CXR FINDINGS: Right chest part with the tip at the cavoatrial junction. Unchanged cardiac and mediastinal contours. No pleural effusion. No pneumothorax. Bibasilar atelectasis. Visualized upper abdomen is unremarkable.No displaced rib fractures. IMPRESSION: Bibasilar atelectasis. Electronically Signed   By: Marin Roberts M.D.   On: 06/18/2022 12:12    EKG: Independently reviewed.   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[place])   Hypomagnesemia   Multiple myeloma (HCC)   Hyperlipidemia   Acute on chronic anemia   Leukopenia   Stroke (cerebrum) -Left parietal lobe, 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    Chronic pain disorder   LVH (left ventricular hypertrophy)   Morbid obesity with BMI of 45.0-49.9, adult (HCC)   Drug-induced polyneuropathy (HCC)   History of CVA with residual deficit   Overactive bladder   Subclinical hypothyroidism   Hypokalemia   Grand Mal Seizure  - I witnessed this in ED with  spouse while doing admission - lorazepam IV ordered PRN recurrent seizure  - keppra IV ordered - EEG ordered - neuro checks ordered - aspiration precautions  - elevate head of bed - monitor in stepdown ICU  - MRI brain without contrast STAT  Severe hypomagnesemia - IV replacement ordered, recheck in AM and replace  further as needed  Hypokalemia - replacing Mg  - IV replacement ordered - recheck in AM   Refractory Multiple Myeloma  - pt is being treated Dr. Delton Coombes - reportedly had last chemo approximately 2 weeks ago - will consult hem/onc on 12/12 - tried to discuss goals of care/code status with spouse but he was unable to discuss  Pancytopenia Thrombocytopenia Anemia in neoplastic disease - discussed indices with hematologist on call Dr. Federico Flake - he recommended to transfuse 1 unit of PRBC and get hematology consult in AM  Cerebrovascular disease from prior CVA with residual aphasia - follow up MRI brain without contrast  - further recs pending results of MRI  - EEG pending    DVT prophylaxis: SCDs   Code Status: Full   Family Communication: spouse at bedside 12/11   Disposition Plan: TBD   Consults called:  hem/onc  Admission status: INP  Level of care: Stepdown Irwin Brakeman MD [facility] How to contact the [facility] Attending or Consulting provider [place] or covering provider during after hours 7P -7A, for this patient?  Check the care team in [facility] and look for a) attending/consulting TRH provider listed and b) the [facility] team listed Log into [email or web address] and use [place]'s universal password to access. If you do not have the password, please contact the hospital operator. Locate the [facility] provider you are looking for under [facility] and page to a number that you can be directly reached. If you still have difficulty reaching the provider, please page the [facility] (Director on Call) for the Hospitalists listed on amion for assistance.   If 7PM-7AM, please contact night-coverage [email or web address] Password [facility]  06/18/2022, 4:27 PM

## 2022-06-18 NOTE — Progress Notes (Addendum)
I reviewed the brain MRI report as well as the labs, notes today.  I discussed the case with teleneurology who evaluated the patient.  Recommendation was for EEG for concern about subclinical seizures and embolic stroke given her known multiple infarcts.  I ordered EEG.  Full dose aspirin was recommended and ordered.  A diet was ordered as well.  PT/OT and ST consults will be obtained.  I increased her Crestor dose I will check fasting lipids in AM.  I also ordered 2D echo with bubble study for further stroke evaluation.  She will likely need cardiology consult to assess for embolic stroke.

## 2022-06-18 NOTE — ED Notes (Addendum)
Dr. Wynetta Emery witness a gran mal seizure and verbalized 1 mg ativan  for new onset seizure. 2.5 lpn of nasal cannula applied for comfort.  Blood noted form mouth and area to tongue.  [place]

## 2022-06-18 NOTE — Progress Notes (Signed)
06/18/2022 6:45 PM  Update:  Still waiting on MRI reading; Pt re-assessed in ICU. Now with facial droop; She is out of post-ictal state and now able to try to follow commands.   I have signed out to night covering [place] MD with instructions to please follow up on MRI studies.  Updated family at bedside that we are waiting on MRI results.    Murvin Natal, MD How to contact the [facility] Attending or Consulting provider [place] or covering provider during after hours [place], for this patient?  Check the care team in [facility] and look for a) attending/consulting TRH provider listed and b) the [facility] team listed Log into [email or web address] and use [place]'s universal password to access. If you do not have the password, please contact the hospital operator. Locate the [facility] provider you are looking for under [facility] and page to a number that you can be directly reached. If you still have difficulty reaching the provider, please page the [facility] (Director on Call) for the Hospitalists listed on amion for assistance.

## 2022-06-18 NOTE — Progress Notes (Signed)
Pharmacy Antibiotic Note  KAO BERKHEIMER is a 56 y.o. female admitted on 06/18/2022 with  unknown source .  Pharmacy has been consulted for Cefepime and Vancomycin dosing.  Plan: Vancomycin '2000mg'$  IV loading dose, then 1500 mg IV Q 24 hrs. Goal AUC 400-550. Expected AUC: 464 SCr used: 0.8(actual is 0.7)  Cefepime 2gm IV q8h F/U cxs and clinical progress Monitor V/S, labs and levels as indicated  Height: '5\' 4"'$  (162.6 cm) Weight: 107 kg (235 lb 14.3 oz) IBW/kg (Calculated) : 54.7  Temp (24hrs), Avg:98.2 F (36.8 C), Min:98.2 F (36.8 C), Max:98.2 F (36.8 C)  Recent Labs  Lab 06/11/22 1310 06/11/22 1418  WBC 3.0*  --   CREATININE  --  0.71    Estimated Creatinine Clearance: 92.6 mL/min (by C-G formula based on SCr of 0.71 mg/dL).    No Known Allergies  Antimicrobials this admission: cefepime 12/11 >>  vancomycin 12/11 >>   Microbiology results: 12/11 BCx: pending 12/11 UCx: pending   MRSA PCR:   Thank you for allowing pharmacy to be a part of this patient's care.  Isac Sarna, BS Pharm D, BCPS Clinical Pharmacist 06/18/2022 11:54 AM

## 2022-06-18 NOTE — Progress Notes (Signed)
Elink following code sepsis °

## 2022-06-19 ENCOUNTER — Inpatient Hospital Stay (HOSPITAL_COMMUNITY): Payer: BC Managed Care – PPO

## 2022-06-19 DIAGNOSIS — I639 Cerebral infarction, unspecified: Secondary | ICD-10-CM | POA: Diagnosis not present

## 2022-06-19 DIAGNOSIS — G40901 Epilepsy, unspecified, not intractable, with status epilepticus: Secondary | ICD-10-CM | POA: Clinically undetermined

## 2022-06-19 DIAGNOSIS — I6389 Other cerebral infarction: Secondary | ICD-10-CM | POA: Diagnosis not present

## 2022-06-19 DIAGNOSIS — R531 Weakness: Secondary | ICD-10-CM | POA: Diagnosis not present

## 2022-06-19 LAB — BPAM RBC
Blood Product Expiration Date: 202401052359
ISSUE DATE / TIME: 202312112057
Unit Type and Rh: 5100

## 2022-06-19 LAB — MAGNESIUM: Magnesium: 1.7 mg/dL (ref 1.7–2.4)

## 2022-06-19 LAB — LIPID PANEL
Cholesterol: 39 mg/dL (ref 0–200)
HDL: 17 mg/dL — ABNORMAL LOW (ref >40)
LDL Cholesterol: 10 mg/dL (ref 0–99)
Total CHOL/HDL Ratio: 2.3 RATIO
Triglycerides: 61 mg/dL (ref <150)
VLDL: 12 mg/dL (ref 0–40)

## 2022-06-19 LAB — BASIC METABOLIC PANEL
Anion gap: 9 (ref 5–15)
BUN: 22 mg/dL — ABNORMAL HIGH (ref 6–20)
CO2: 22 mmol/L (ref 22–32)
Calcium: 7.4 mg/dL — ABNORMAL LOW (ref 8.9–10.3)
Chloride: 107 mmol/L (ref 98–111)
Creatinine, Ser: 0.64 mg/dL (ref 0.44–1.00)
GFR, Estimated: >60 mL/min (ref >60)
Glucose, Bld: 134 mg/dL — ABNORMAL HIGH (ref 70–99)
Potassium: 3.4 mmol/L — ABNORMAL LOW (ref 3.5–5.1)
Sodium: 138 mmol/L (ref 135–145)

## 2022-06-19 LAB — ECHOCARDIOGRAM COMPLETE BUBBLE STUDY
AR max vel: 2.81 cm2
AV Area VTI: 2.95 cm2
AV Area mean vel: 3 cm2
AV Mean grad: 5 mmHg
AV Peak grad: 12.4 mmHg
Ao pk vel: 1.76 m/s
Area-P 1/2: 5.06 cm2
MV VTI: 3.14 cm2
P 1/2 time: 603 msec
S' Lateral: 3.3 cm

## 2022-06-19 LAB — CBC
HCT: 25.5 % — ABNORMAL LOW (ref 36.0–46.0)
Hemoglobin: 8.4 g/dL — ABNORMAL LOW (ref 12.0–15.0)
MCH: 30 pg (ref 26.0–34.0)
MCHC: 32.9 g/dL (ref 30.0–36.0)
MCV: 91.1 fL (ref 80.0–100.0)
Platelets: 18 10*3/uL — CL (ref 150–400)
RBC: 2.8 MIL/uL — ABNORMAL LOW (ref 3.87–5.11)
RDW: 17.9 % — ABNORMAL HIGH (ref 11.5–15.5)
WBC: 3.6 10*3/uL — ABNORMAL LOW (ref 4.0–10.5)
nRBC: 0.8 % — ABNORMAL HIGH (ref 0.0–0.2)

## 2022-06-19 LAB — TYPE AND SCREEN
ABO/RH(D): A POS
Antibody Screen: NEGATIVE
Donor AG Type: NEGATIVE
Unit division: 0

## 2022-06-19 LAB — URINE CULTURE: Culture: NO GROWTH

## 2022-06-19 LAB — GLUCOSE, CAPILLARY
Glucose-Capillary: 126 mg/dL — ABNORMAL HIGH (ref 70–99)
Glucose-Capillary: 123 mg/dL — ABNORMAL HIGH (ref 70–99)
Glucose-Capillary: 120 mg/dL — ABNORMAL HIGH (ref 70–99)
Glucose-Capillary: 112 mg/dL — ABNORMAL HIGH (ref 70–99)

## 2022-06-19 LAB — TSH: TSH: 2.061 u[IU]/mL (ref 0.350–4.500)

## 2022-06-19 LAB — PROCALCITONIN: Procalcitonin: 0.3 ng/mL

## 2022-06-19 MED ORDER — CHLORHEXIDINE GLUCONATE CLOTH 2 % EX PADS
6.0000 | MEDICATED_PAD | Freq: Every day | CUTANEOUS | Status: DC
  Administered 2022-06-19 – 2022-07-10 (×20): 6 via TOPICAL

## 2022-06-19 MED ORDER — SODIUM CHLORIDE 0.9 % IV SOLN: 3000.0000 mg | Freq: Two times a day (BID) | INTRAVENOUS | Status: DC

## 2022-06-19 MED ORDER — LORAZEPAM 2 MG/ML IJ SOLN
2.0000 mg | INTRAMUSCULAR | Status: AC
  Administered 2022-06-19: 2 mg via INTRAVENOUS

## 2022-06-19 MED ORDER — SODIUM CHLORIDE 0.9 % IV SOLN
200.0000 mg | Freq: Once | INTRAVENOUS | Status: AC
  Administered 2022-06-19: 200 mg via INTRAVENOUS
  Filled 2022-06-19 (×2): qty 20

## 2022-06-19 MED ORDER — PHENYTOIN SODIUM 50 MG/ML IJ SOLN
100.0000 mg | Freq: Three times a day (TID) | INTRAMUSCULAR | Status: DC
  Administered 2022-06-20 – 2022-06-26 (×18): 100 mg via INTRAVENOUS
  Filled 2022-06-19 (×20): qty 2

## 2022-06-19 MED ORDER — MAGNESIUM SULFATE 4 GM/100ML IV SOLN
4.0000 g | Freq: Once | INTRAVENOUS | Status: AC
  Administered 2022-06-19: 4 g via INTRAVENOUS

## 2022-06-19 MED ORDER — PHENYTOIN SODIUM 50 MG/ML IJ SOLN
100.0000 mg | Freq: Three times a day (TID) | INTRAMUSCULAR | Status: DC
  Filled 2022-06-19: qty 2

## 2022-06-19 MED ORDER — MAGNESIUM SULFATE 4 GM/100ML IV SOLN
INTRAVENOUS | Status: AC
  Filled 2022-06-19: qty 100

## 2022-06-19 MED ORDER — LORAZEPAM 2 MG/ML IJ SOLN
INTRAMUSCULAR | Status: AC
  Filled 2022-06-19: qty 1

## 2022-06-19 MED ORDER — SODIUM CHLORIDE 0.9 % IV SOLN
4000.0000 mg | Freq: Once | INTRAVENOUS | Status: AC
  Administered 2022-06-19: 4000 mg via INTRAVENOUS
  Filled 2022-06-19: qty 40

## 2022-06-19 MED ORDER — LEVETIRACETAM IN NACL 1500 MG/100ML IV SOLN
1500.0000 mg | Freq: Two times a day (BID) | INTRAVENOUS | Status: DC
  Administered 2022-06-20 – 2022-06-28 (×18): 1500 mg via INTRAVENOUS
  Filled 2022-06-19 (×21): qty 100

## 2022-06-19 MED ORDER — SODIUM CHLORIDE 0.9 % IV SOLN
1500.0000 mg | Freq: Once | INTRAVENOUS | Status: AC
  Administered 2022-06-19: 1500 mg via INTRAVENOUS
  Filled 2022-06-19: qty 30

## 2022-06-19 MED ORDER — ASPIRIN 300 MG RE SUPP
300.0000 mg | Freq: Every day | RECTAL | Status: DC
  Administered 2022-06-19 – 2022-06-28 (×9): 300 mg via RECTAL
  Filled 2022-06-19 (×11): qty 1

## 2022-06-19 NOTE — Evaluation (Signed)
Clinical/Bedside Swallow Evaluation Patient Details  Name: Joanna Reid MRN: [id number] Date of Birth: August 26, 1965  Today's Date: 06/19/2022 Time: SLP Start Time (ACUTE ONLY): 1303 SLP Stop Time (ACUTE ONLY): 1333 SLP Time Calculation (min) (ACUTE ONLY): 30 min  Past Medical History:  Past Medical History:  Diagnosis Date   Acid reflux    Allergic rhinitis    Cancer ([place])    multiple myeloma   Diabetes mellitus    type 2   Gout    Gout    H/o COVID-19--- was Positive 09/17/2019, Negative 12/23/19 AND also Neg on  12/26/19 12/26/2019   HBP (high blood pressure)    History of kidney stones    Migraines    Multiple myeloma ([place]) 10/29/2017   Past Surgical History:  Past Surgical History:  Procedure Laterality Date   BREAST CYST EXCISION Left    2009 no visible scar on skin   CESAREAN SECTION     COLONOSCOPY WITH PROPOFOL N/A 12/25/2019   Procedure: COLONOSCOPY WITH PROPOFOL;  Surgeon: Rogene Houston, MD;  [facility];  Service: Endoscopy;  Laterality: N/A;  730   EXTRACORPOREAL SHOCK WAVE LITHOTRIPSY Left 10/10/2017   Procedure: LEFT EXTRACORPOREAL SHOCK WAVE LITHOTRIPSY (ESWL);  Surgeon: Bjorn Loser, MD;  Location: [facility];  Service: Urology;  Laterality: Left;   EYE SURGERY     HEMORRHOID SURGERY N/A 11/19/2012   Procedure: HEMORRHOIDECTOMY;  Surgeon: Jamesetta So, MD;  Location: [facility];  Service: General;  Laterality: N/A;   IR IMAGING GUIDED PORT INSERTION  01/18/2022   kidney stones  1998   LAPAROSCOPIC UNILATERAL SALPINGO OOPHERECTOMY  05/14/2012   Procedure: LAPAROSCOPIC UNILATERAL SALPINGO OOPHORECTOMY;  Surgeon: [name], MD;  Location: [facility];  Service: Gynecology;  Laterality: Right;  laparoscopic right salpingo-oophorectomy   PARTIAL HYSTERECTOMY     POLYPECTOMY  12/25/2019   Procedure: POLYPECTOMY;  Surgeon: Rogene Houston, MD;  Location[facility];  Service: Endoscopy;;   TONSILECTOMY, ADENOIDECTOMY, BILATERAL MYRINGOTOMY AND TUBES      VESICOVAGINAL FISTULA CLOSURE W/ TAH     HPI:  56 year old female with refractory multiple myeloma status post relapse, hypertension, gout, GERD, type 2 diabetes mellitus, migraine headaches, stroke with residual aphasia presented to ED today due to ongoing weakness and lethargy.  She reportedly last had chemotherapy 2 weeks ago.  She has recently received PRBC and platelets in the cancer center.   Her work up revealed a K of 2.5, Hg was 7.7, WBC 2.6, platelets 15.  She noted no spontaneous bleeding. Pt had a seizure while being evaluated by MD in the ED. MRI increased diffusion signal in the left thalamus and parietal lobe. She also has a small incidental stroke in the right caudate. BSE requested.    Assessment / Plan / Recommendation  Clinical Impression  Clinical swallow evaluation completed at bedside. Pt is known to SLP from previous outpatient therapy for aphasia. Pt is alert and smiled at SLP upon arrival. No meaningful speech heard during visit, however she did make attempts to talk. She required visual and tactile cues to open her mouth and protrude tongue etc. Pt accepted single ice chips with some difficulty opening her mouth wide enough and appeared to have difficulty coordinating breathing (pursing lips) and accepting bolus. Limited amounts of puree and ice chips presented without signs of reduced airway protection. She had difficulty closing lips around the cup for cup sips and was unable to suck from the straw. Pt became fatigued (closing  eyes and turning head away) during visit. Pt is quite different from when SLP last saw Pt for OP therapy. She will need ongoing SLP f/u at [facility] for dysphagia and aphasia. Recommend NPO except for ice chips after oral care presented one at at time and if PO medications are necessary, crush as able and present in a small amount of puree or sherbet (IV if able) and f/u SLP at [facility]. Above to Pt/family and Therapist, sports. SLP Visit Diagnosis: Dysphagia,  unspecified (R13.10)    Aspiration Risk  Moderate aspiration risk;Mild aspiration risk;Risk for inadequate nutrition/hydration    Diet Recommendation Ice chips PRN after oral care;NPO except meds   Medication Administration: Via alternative means    Other  Recommendations Oral Care Recommendations: Oral care prior to ice chip/H20;Staff/trained caregiver to provide oral care    Recommendations for follow up therapy are one component of a multi-disciplinary discharge planning process, led by the attending physician.  Recommendations may be updated based on patient status, additional functional criteria and insurance authorization.  Follow up Recommendations Acute inpatient rehab (3hours/day)      Assistance Recommended at Discharge    Functional Status Assessment Patient has had a recent decline in their functional status and demonstrates the ability to make significant improvements in function in a reasonable and predictable amount of time.  Frequency and Duration min 2x/week  1 week       Prognosis Prognosis for Safe Diet Advancement: Fair Barriers to Reach Goals: Severity of deficits      Swallow Study   General Date of Onset: 06/18/22 HPI: 56 year old female with refractory multiple myeloma status post relapse, hypertension, gout, GERD, type 2 diabetes mellitus, migraine headaches, stroke with residual aphasia presented to ED today due to ongoing weakness and lethargy.  She reportedly last had chemotherapy 2 weeks ago.  She has recently received PRBC and platelets in the cancer center.   Her work up revealed a K of 2.5, Hg was 7.7, WBC 2.6, platelets 15.  She noted no spontaneous bleeding. Pt had a seizure while being evaluated by MD in the ED. MRI increased diffusion signal in the left thalamus and parietal lobe. She also has a small incidental stroke in the right caudate. BSE requested. Type of Study: Bedside Swallow Evaluation Diet Prior to this Study: Regular;Thin  liquids Temperature Spikes Noted: No Respiratory Status: Nasal cannula History of Recent Intubation: No Behavior/Cognition: Alert;Cooperative;Requires cueing Oral Cavity Assessment: Within Functional Limits Oral Care Completed by SLP: Recent completion by staff Oral Cavity - Dentition: Adequate natural dentition (missing some) Self-Feeding Abilities: Total assist Patient Positioning: Upright in bed Baseline Vocal Quality: Not observed Volitional Cough: Strong Volitional Swallow: Able to elicit    Oral/Motor/Sensory Function Overall Oral Motor/Sensory Function: Within functional limits   Ice Chips Ice chips: Impaired Presentation: Spoon Oral Phase Impairments: Reduced labial seal;Reduced lingual movement/coordination Oral Phase Functional Implications: Right anterior spillage   Thin Liquid Thin Liquid: Impaired Presentation: Spoon;Straw;Cup Oral Phase Impairments: Reduced labial seal;Reduced lingual movement/coordination Oral Phase Functional Implications: Right anterior spillage    Nectar Thick Nectar Thick Liquid: Not tested   Honey Thick Honey Thick Liquid: Not tested   Puree Puree: Impaired Presentation: Spoon Oral Phase Impairments: Other (comment) (cues for oral acceptance of spoon) Oral Phase Functional Implications: Prolonged oral transit   Solid     Solid: Not tested     Thank you,  Genene Churn, [place]  Kampbell Holaway 06/19/2022,2:27 PM

## 2022-06-19 NOTE — Consult Note (Signed)
Neurology Consultation Reason for Consult: Stroke Referring Physician: Murvin Natal  CC: Seizure  History is obtained from: Patient  HPI: Joanna Reid is a 56 y.o. female with a history of previous stroke with resultant aphasia who presented to the emergency department last night with generalized weakness and lethargy and was witnessed yesterday evening to have a tonic-clonic seizure.   She has been significantly more aphasic and     Past Medical History:  Diagnosis Date   Acid reflux    Allergic rhinitis    Cancer ([place])    multiple myeloma   Diabetes mellitus    type 2   Gout    Gout    H/o COVID-19--- was Positive 09/17/2019, Negative 12/23/19 AND also Neg on  12/26/19 12/26/2019   HBP (high blood pressure)    History of kidney stones    Migraines    Multiple myeloma ([place]) 10/29/2017     Family History  Problem Relation Age of Onset   Arthritis Other    Cancer Other    Diabetes Other    Hypertension Mother    Dementia Mother    Diabetes Father    ALS Father    Diabetes Brother    Hypertension Brother    Cancer Paternal Aunt    COPD Maternal Grandmother    Cancer Maternal Grandfather    Anesthesia problems Paternal Grandfather      Social History:  reports that she quit smoking about 23 years ago. Her smoking use included cigarettes. She has never used smokeless tobacco. She reports that she does not drink alcohol and does not use drugs.   Exam: Current vital signs: BP (!) 161/92   Pulse 93   Temp 99.5 F (37.5 C) (Bladder)   Resp (!) 24   Ht _0  (1.626 m)   Wt 109.4 kg   SpO2 98%   BMI 41.40 kg/m  Vital signs in last 24 hours: Temp:  [97 F (36.1 C)-100.2 F (37.9 C)] 99.5 F (37.5 C) (12/12 0721) Pulse Rate:  [70-108] 93 (12/12 0721) Resp:  [10-40] 24 (12/12 0721) BP: (119-188)/(76-106) 161/92 (12/12 0600) SpO2:  [95 %-100 %] 98 % (12/12 0721) Weight:  [107 kg-109.4 kg] 109.4 kg (12/12 0500)   Physical Exam  NIHSS score: 15 1A: Level  of Consciousness - 0 1B: Ask Month and Age - 2 1C: 'Blink Eyes' & 'Squeeze Hands' - 2 2: Test Horizontal Extraocular Movements - 1 3: Test Visual Fields - 2(to blink to threat)  4: Test Facial Palsy - 1(right facial weakness)  5A: Test Left Arm Motor Drift - 0 5B: Test Right Arm Motor Drift - 3(right arm subtle clonic activity)  6A: Test Left Leg Motor Drift - 3 6B: Test Right Leg Motor Drift - 3(wiggles toes in response to noxious stimulation, has some subtle clonic activity too) 7: Test Limb Ataxia - 0 8: Test Sensation - 2 9: Test Language/Aphasia- 3 10: Test Dysarthria - 2 11: Test Extinction/Inattention - 0  She has right arm clinic activity lasting while I was examining her this involved the arm and leg. . She also at another time had some lip smacking behavior.   She does engage with a the nurses, and smiles in response to nurse sticking out tongue, but does not follow any commands, verbal or prompted, and      I have reviewed labs in epic and the results pertinent to this consultation are: UA is negative.  Mg 0.8 on arrival, now 1.7 Ca  7.4, albumin 2.5   I have reviewed the images obtained:MRI increased diffusion signal in the left thalamus and parietal lobe. She also has a small incidental stroke in the right caudate.    Impression: 56 yo F with new onset partial status epilepticus in the setting of previous stroke and severe hypomagnesemia. I suspect that she is predisposed to seizure due to her    I suspect the small stroke is incidental and her presentation is all due to status epilepticus.   Recommendations:  1) Ativan 93m x 1 2) Keppra 4g x 1(already got 5059mthis am), then 150015mID  3) If still seizing after this, will load with fosphenytoin 20 PE/Kg  4) ASA as antilpatelet 5) Echo, tleemetry 6) LDL goal < 70, at 10 currently 7) A1C goal < 7 8) [facility]     Mc[place]oland RackD [facility] [phone] 7pm- 7am, please page neurology on call [place]

## 2022-06-19 NOTE — TOC Progression Note (Signed)
  Transition of Care [facility]) Screening Note   Patient Details  Name: Joanna Reid Date of Birth: 1965/12/28   Transition of Care [facility]) CM/SW Contact:    Shade Flood, LCSW Phone Number: 06/19/2022, 1:17 PM   Pt status reviewed with MD and plan is for transfer to [facility] for higher level of care. Per MD, pt had grand mal seizure in ED and she is admitted with a new CVA. TOC at [facility] will further assess and assist with dc planning.  Transition of Care Department [facility]) has reviewed patient and no TOC needs have been identified at this time. We will continue to monitor patient advancement through interdisciplinary progression rounds. If new patient transition needs arise, please place a TOC consult.

## 2022-06-19 NOTE — Progress Notes (Signed)
*  PRELIMINARY RESULTS* Echocardiogram 2D Echocardiogram has been performed.  Joanna Reid 06/19/2022, 9:43 AM

## 2022-06-19 NOTE — Progress Notes (Addendum)
NEUROLOGY CONSULTATION    Date of service: June 19, 2022 Patient Name: Joanna Reid MRN:  [id number] DOB:  Mar 10, 1966  HPI  Joanna Reid is a 56 y.o. female with PMH significant for previous stroke resulting in aphasia, multiple myeloma, type 2 DM, hypertension, migraines, who presents with witnessed generalized tonic-clonic seizure at [facility] ED after coming into the hospital last night with generalized weakness and lethargy.  Seizure reported to have lasted 2-3 minutes. Patient was given 26m IV lorazepam, seizure subsided post-medication. Was also loaded with Keppra.  CT head did not show any acute intracranial hemorrhage. IV thrombolytics (tPA or tNK): Not a candidate because outside the window with hx of recent strokes Stat MRI brain was then completed: widespread multiple myeloma, infarcts and signal abnormalities suggestive of seizure seen on report.  Pt then exhibited a facial droop. Full-dose aspirin was given.   Patient is currently receiving chemotherapy, last treatment was 2 weeks ago. She recently received PRBC and platelets in the cancer center. Labs at [facility] showed: Hgb 7.7, WBC 2.6, platelets 15, and K 2.5, Mag 0.8.electrolyte replacements and transfusions were given at [facility].   [facility]    Interval Hx   Upon assessment, patient exhibited seizure-like, rhythmic movements of the right arm and foot, along with drooling from her mouth. Patient was unresponsive, did not follow commands. The event button was pushed on the LTM EEG. Dilantin load was ordered by Dr. LCheral Markerand RN was made aware of stat order.   Vitals   Vitals:   06/19/22 1000 06/19/22 1114 06/19/22 1300 06/19/22 1552  BP: (!) 175/84  (!) 164/101 (!) 155/102  Pulse: 85  85 92  Resp: (!) 21  (!) 23   Temp: 98.8 F (37.1 C) 97.9 F (36.6 C) 99.1 F (37.3 C) 97.7 F (36.5 C)  TempSrc: Bladder Bladder  Oral  SpO2: 100%  99% 99%  Weight:      Height:          Body mass index is 41.4 kg/m.  Physical Exam   General: Ill-appearing obese woman lying in bed.   CV: No JVD. Generalized edema.  Pulmonary: Symmetric Chest rise. Swallow breathing.  Ext: No cyanosis, deformity. Generalized edema with 2+ edema to b/l hands.  Skin: No rash, no open wounds.  Musculoskeletal: Normal digits and nails by inspection. No clubbing.   Neurologic Examination  Mental status/Cognition: Eyes open, unresponsive, not following commands.   Speech/language: Moaning only appreciated. No response to orientation questions.   Cranial nerves:   CN II Pupils equal and reactive. Unable to assess visual fields.    CN III,IV,VI Patient would not track to assess EOM   CN V Unable to assess sensation due to mental status    CN VII Left facial droop appreciated   CN VIII Unable to assess hearing   CN IX & X Unable to assess due to mental status   CN XI Unable to assess due to mental status   CN XII Unable to assess due to mental status  Motor:  No purposeful movement appreciated. Weakly withdraws BLE to noxious plantar stimulation. Moves BUE to noxious stimuli. Movements lower in amplitude on the right.  Sensation: Patient withdrew to pain in all 4 extremities. Coordination/Complex Motor: Unable to assess Gait: Deferred    Labs   Basic Metabolic Panel:  Lab Results  Component Value Date   NA 138 06/19/2022   K 3.4 (L) 06/19/2022  CO2 22 06/19/2022   GLUCOSE 134 (H) 06/19/2022   BUN 22 (H) 06/19/2022   CREATININE 0.64 06/19/2022   CALCIUM 7.4 (L) 06/19/2022   GFRNONAA >60 06/19/2022   GFRAA 51 (L) 04/06/2020   HbA1c:  Lab Results  Component Value Date   HGBA1C 6.6 (H) 01/30/2022   LDL:  Lab Results  Component Value Date   LDLCALC 10 06/19/2022   Urine Drug Screen:     Component Value Date/Time   LABOPIA NONE DETECTED 12/26/2019 1022   COCAINSCRNUR NONE DETECTED 12/26/2019 1022   LABBENZ POSITIVE (A) 12/26/2019 1022   AMPHETMU NONE DETECTED  12/26/2019 1022   THCU NONE DETECTED 12/26/2019 1022   LABBARB NONE DETECTED 12/26/2019 1022    Alcohol Level     Component Value Date/Time   ETH <10 12/26/2019 1022   No results found for: "PHENYTOIN", "ZONISAMIDE", "LAMOTRIGINE", "LEVETIRACETA" No results found for: "PHENYTOIN", "PHENOBARB", "VALPROATE", "CBMZ"  Imaging and Diagnostic studies  Results for orders placed during the hospital encounter of 06/18/22  ECHOCARDIOGRAM COMPLETE BUBBLE STUDY   IMPRESSIONS 1. Left ventricular ejection fraction, by estimation, is 60 to 65%. The left ventricle has normal function. The left ventricle has no regional wall motion abnormalities. There is moderate asymmetric left ventricular hypertrophy of the septal segment. Left ventricular diastolic parameters are consistent with Grade I diastolic dysfunction (impaired relaxation). 2. Right ventricular systolic function is normal. The right ventricular size is normal. There is normal pulmonary artery systolic pressure. The estimated right ventricular systolic pressure is 95.6 mmHg. 3. The mitral valve is grossly normal. Trivial mitral valve regurgitation. 4. The aortic valve is tricuspid. Aortic valve regurgitation is mild. Aortic regurgitation PHT measures 603 msec. 5. Aortic dilatation noted. There is mild dilatation of the ascending aorta, measuring 41 mm. 6. The inferior vena cava is normal in size with <50% respiratory variability, suggesting right atrial pressure of 8 mmHg. 7. Agitated saline contrast bubble study was negative, with no evidence of right to left interatrial shunt. Comparison(s): Prior images reviewed side by side. LVEF remains normal range at 60-65%. Mildly dilated ascending aorta with mild aortic regurgitation.   Impression   Joanna Reid is a 56 y.o. 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 Witnessed tonic-clonic seizure occurred at [facility]. MRI showed infarcts.Transferred  to [facility] for LTNM EEG. Seizures seen on EEG upon hook-up and witnessed by staff. No prior seizure history per family.  - Focal status epilepticus - MRI brain 12/11: Foci of cortical diffusion-weighted signal abnormality along the margins of a chronic left parietal lobe infarct (within the left parietal lobe, at the left temporoparietal junction and within the lateral left occipital lobe). Subtle diffusion-weighted signal abnormality is also suspected within the posteromedial left thalamus. These findings are suspicious for seizure-related changes. However, alternatively, these may reflect acute infarcts. New 2 mm acute white matter infarct immediately lateral to the right caudate head. Redemonstrated tiny subacute infarcts within the anterior right corona radiata and left occipital lobe white matter (right MCA and left PCA vascular territories, respectively). Background chronic small vessel ischemic changes, as described and unchanged from the recent prior brain MRI of 06/12/2022. Redemonstrated small chronic infarcts within both cerebellar hemispheres. Widespread multiple myeloma. - MRA head: No intracranial large vessel occlusion is identified. Intracranial atherosclerotic disease with multifocal stenoses, most notably as follows. Moderate/severe stenosis within the right ACA proximal A2 segment, new from the prior CTA head/neck of 12/26/2019. Sites of up to moderate stenosis within left PCA  branches at the P2/P3 junction, progressed. Moderate stenosis within a right PCA branch at the P2/P3 junction, progressed. - Hypokalemia 2.5->3.4 - Hypomagensia 0.8 ->1.7  Recommendations  Fosphenytoin load STAT for continued focal seizure activity on exam after arrival from OSH.  Continue LTM EEG, which is being monitored at regular intervals.  Continue Keppra Continue ASA May need to be started on burst suppression protocol if fosphenytoin load does not result in resolution of clinical or electrographic focal  status epilepticus.  Discussed with Dr. Rory Percy in sign out.  ______________________________________________________________________   Vivi Ferns, DNP, AGACNP-BC [facility] Please see AMION for pager   I have seen and examined the patient. I have formulated the assessment and recommendations. 56 year old female with PMH significant for multiple myeloma, previous stroke resulting in aphasia. Witnessed tonic-clonic seizure occurred at [facility]. MRI showed infarcts.Transferred to [facility] for LTNM EEG. Seizures seen on EEG upon hook-up and witnessed by staff. No prior seizure history per family. Continued seizure activity on her right side after arrival to [facility]. Loaded with fosphenytoin. Additional recommendations are as documented above.  Electronically signed: Dr. Kerney Elbe

## 2022-06-19 NOTE — Progress Notes (Signed)
EEG complete - results pending 

## 2022-06-19 NOTE — Progress Notes (Signed)
Notified by Dr. Quinn Axe that initial tracings on the LTM EEG that has just been hooked up reveal the following: Borderline left focal status epilepticus since hook up at 1700. Definitive seizures lasting up to 2-3 minutes with continuous left LPDs between the electrographic seizures.   Loading with fosphenytoin 20 mg PE/kg, followed by scheduled Dilantin at 100 mg IV TID.   Electronically signed: Dr. Kerney Elbe

## 2022-06-19 NOTE — Progress Notes (Addendum)
LTM EEG hooked up and running - no initial skin breakdown - push button tested - [facility] monitoring.new leads used

## 2022-06-19 NOTE — Progress Notes (Signed)
PROGRESS NOTE   Joanna Reid  WRU:[id number] DOB: 1966/06/01 DOA: 06/18/2022 PCP: Lindell Spar, MD   Chief Complaint  Patient presents with   Weakness   Level of care: Progressive  Brief Admission History:  56 year old female with refractory multiple myeloma status post relapse, hypertension, gout, GERD, type 2 diabetes mellitus, migraine headaches, stroke with residual aphasia presented to ED today due to ongoing weakness and lethargy.  She reportedly last had chemotherapy 2 weeks ago.  She has recently received PRBC and platelets in the cancer center.   Her work up revealed a K of 2.5, Hg was 7.7, WBC 2.6, platelets 15.  She noted no spontaneous bleeding.   While I was evaluating patient in the ED she was noted to become acutely somnolent and then eyes rolled in back of head and she had a full tonic clonic seizure lasting about 2 to 3 mins.  I called for emergency lorazepam 1 mg IV and after that was given it subsided and she was in a post-ictal state. She bit her tongue and there was some bleeding from mouth.    I notified the ER providers.  I ordered stat MRI brain.  I ordered keppra IV.  I changed bed request to stepdown ICU.   I asked spouse about code status but he was too upset to give me an answer and he walked out of room.  Follow up on MRI and will ask for neurology consultation.  Pt is being admitted for further management.    Transferring to [facility] for status epilepticus management.     Assessment and Plan:  New Onset Partial Status Epilepticus  - seen by Dr Leonel Ramsay with neurology - recommended transfer to [facility] for continuous EEG and neurology care - spoke with [facility] and bed placement and requested progressive bed at [facility] - was told likely late afternoon today would get bed at [facility]; updated neuro - Pt was given lorazepam 2 mg IV x 1 dose by neuro - Pt loaded with keppra 4 gm IV x 1 followed by 1500 mg BID by neurologist - Will reassess in 30 mins after keppra given to be sure  seizure activity subsided; if persists, load with fosphenytoin  Cerebrovascular Disease New CVA in right caudate  - pt remains on aspirin for antiplatelet therapy - continue statin therapy, LDL is 10  - neuro checks - TTE pending with bubble study - check A1c and TSH  - permissive hypertension x 48 hours  - continue IV fluid hydration  - SLP/PT/OT evaluation  Severe Hypomagnesemia  - Mg was 0.8 on admission - 4 gm IV magnesium given - Mg improved to 1.7, give additional 4 gm IV x 1 dose 12/12  Hypokalemia - IV replacement given - replacing Mg   Refractory Multiple Myeloma  - treated by Dr. Delton Coombes - last chem given reportedly 2 weeks ago - family requested inpatient consult with Dr. Raliegh Ip  Pancytopenia  - s/p 1 unit PRBC recommended by Dr. Federico Flake - Hg up to 8.4 - Platelets stable at 18 - no bleeding complications so far - WBC up to 3.6    DVT prophylaxis: SCD Code Status: full  Family Communication: spouse, daughter bedside 06/19/22 updated Disposition: Status is: Inpatient Remains inpatient appropriate because: IV medications required    Consultants:  Neurology  Procedures:   Antimicrobials:    Subjective: Pt having twitching motions in feet and legs.  Not following commands.   Objective: Vitals:   06/19/22 0800 06/19/22 0900 06/19/22  1000 06/19/22 1114  BP: (!) 177/98 (!) 147/78 (!) 175/84   Pulse: 91 86 85   Resp: 18 (!) 21 (!) 21   Temp: 99.3 F (37.4 C) 99.1 F (37.3 C) 98.8 F (37.1 C) 97.9 F (36.6 C)  TempSrc: Bladder Bladder Bladder Bladder  SpO2: 97% 100% 100%   Weight:      Height:        Intake/Output Summary (Last 24 hours) at 06/19/2022 1149 Last data filed at 06/19/2022 1114 Gross per 24 hour  Intake 2767.15 ml  Output 600 ml  Net 2167.15 ml   Filed Weights   06/18/22 1128 06/18/22 1728 06/19/22 0500  Weight: 107 kg 109.3 kg 109.4 kg   Examination:  General exam: pt minimally interactive with family and staff but improved  from yesterday; somnolent;  not following commands.  Respiratory system: Clear to auscultation. Respiratory effort normal. Cardiovascular system: normal S1 & S2 heard. No JVD, murmurs, rubs, gallops or clicks. 2+ pedal edema. Gastrointestinal system: Abdomen is nondistended, soft and nontender. No organomegaly or masses felt. Normal bowel sounds heard. Central nervous system: somnolent, not following commands, mild twitching in lower extremities seen; facial droop present;   Extremities: 1+ edema BLEs.  Skin: No rashes, lesions or ulcers. Psychiatry: unable to determine at this time.    Data Reviewed: I have personally reviewed following labs and imaging studies  CBC: Recent Labs  Lab 06/18/22 1217 06/19/22 0416  WBC 2.6* 3.6*  NEUTROABS 1.3*  --   HGB 7.7* 8.4*  HCT 22.5* 25.5*  MCV 87.2 91.1  PLT 15* 18*    Basic Metabolic Panel: Recent Labs  Lab 06/18/22 1217 06/18/22 1523 06/19/22 0416  NA 136  --  138  K 2.5*  --  3.4*  CL 100  --  107  CO2 25  --  22  GLUCOSE 110*  --  134*  BUN 21*  --  22*  CREATININE 0.60  --  0.64  CALCIUM 8.0*  --  7.4*  MG  --  0.8* 1.7    CBG: Recent Labs  Lab 06/18/22 1945 06/19/22 0317 06/19/22 0446 06/19/22 0723 06/19/22 1111  GLUCAP 122* 126* 123* 120* 112*    Recent Results (from the past 240 hour(s))  Blood Culture (routine x 2)     Status: None (Preliminary result)   Collection Time: 06/18/22 12:17 PM   Specimen: BLOOD RIGHT HAND  Result Value Ref Range Status   Specimen Description BLOOD RIGHT HAND  Final   Special Requests   Final    BOTTLES DRAWN AEROBIC AND ANAEROBIC Blood Culture adequate volume   Culture   Final    NO GROWTH < 24 HOURS Performed at [facility], [address] [place], [place] [address]    Report Status PENDING  Incomplete  Blood Culture (routine x 2)     Status: None (Preliminary result)   Collection Time: 06/18/22 12:17 PM   Specimen: Left Antecubital; Blood  Result Value Ref Range Status    Specimen Description LEFT ANTECUBITAL  Final   Special Requests   Final    BOTTLES DRAWN AEROBIC AND ANAEROBIC Blood Culture adequate volume   Culture   Final    NO GROWTH < 24 HOURS Performed at [facility], [address] [place], [place] [address]    Report Status PENDING  Incomplete  Resp Panel by RT-PCR (Flu A&B, Covid) Anterior Nasal Swab     Status: None   Collection Time: 06/18/22  1:18 PM  Specimen: Anterior Nasal Swab  Result Value Ref Range Status   SARS Coronavirus 2 by RT PCR NEGATIVE NEGATIVE Final    Comment: (NOTE) SARS-CoV-2 target nucleic acids are NOT DETECTED.  The SARS-CoV-2 RNA is generally detectable in upper respiratory specimens during the acute phase of infection. The lowest concentration of SARS-CoV-2 viral copies this assay can detect is 138 copies/mL. A negative result does not preclude SARS-Cov-2 infection and should not be used as the sole basis for treatment or other patient management decisions. A negative result may occur with  improper specimen collection/handling, submission of specimen other than nasopharyngeal swab, presence of viral mutation(s) within the areas targeted by this assay, and inadequate number of viral copies(<138 copies/mL). A negative result must be combined with clinical observations, patient history, and epidemiological information. The expected result is Negative.  Fact Sheet for Patients:  EntrepreneurPulse.com.au  Fact Sheet for Healthcare Providers:  IncredibleEmployment.be  This test is no t yet approved or cleared by the [place] FDA and  has been authorized for detection and/or diagnosis of SARS-CoV-2 by FDA under an Emergency Use Authorization (EUA). This EUA will remain  in effect (meaning this test can be used) for the duration of the COVID-19 declaration under Section 564(b)(1) of the Act, 21 U.S.C.section 360bbb-3(b)(1), unless the authorization is terminated  or  revoked sooner.       Influenza A by PCR NEGATIVE NEGATIVE Final   Influenza B by PCR NEGATIVE NEGATIVE Final    Comment: (NOTE) The Xpert Xpress SARS-CoV-2/FLU/RSV plus assay is intended as an aid in the diagnosis of influenza from Nasopharyngeal swab specimens and should not be used as a sole basis for treatment. Nasal washings and aspirates are unacceptable for Xpert Xpress SARS-CoV-2/FLU/RSV testing.  Fact Sheet for Patients: EntrepreneurPulse.com.au  Fact Sheet for Healthcare Providers: IncredibleEmployment.be  This test is not yet approved or cleared by the [place] FDA and has been authorized for detection and/or diagnosis of SARS-CoV-2 by FDA under an Emergency Use Authorization (EUA). This EUA will remain in effect (meaning this test can be used) for the duration of the COVID-19 declaration under Section 564(b)(1) of the Act, 21 U.S.C. section 360bbb-3(b)(1), unless the authorization is terminated or revoked.  Performed at [facility], [address], [place], [place] [address]   MRSA Next Gen by PCR, Nasal     Status: None   Collection Time: 06/18/22  5:29 PM   Specimen: Nasal Mucosa; Nasal Swab  Result Value Ref Range Status   MRSA by PCR Next Gen NOT DETECTED NOT DETECTED Final    Comment: (NOTE) The GeneXpert MRSA Assay (FDA approved for NASAL specimens only), is one component of a comprehensive MRSA colonization surveillance program. It is not intended to diagnose MRSA infection nor to guide or monitor treatment for MRSA infections. Test performance is not FDA approved in patients less than 29 years old. Performed at [facility], [address], [place], [place] [address]      Radiology Studies: ECHOCARDIOGRAM COMPLETE BUBBLE STUDY  Result Date: 06/19/2022    ECHOCARDIOGRAM REPORT   Patient Name:   Joanna Reid Date of Exam: 06/19/2022 Medical Rec #:  [id number]        Height:       64.0 in Accession #:     [id number]       Weight:       241.2 lb Date of Birth:  1965-10-01        BSA:  2.118 m Patient Age:    47 years         BP:           161/92 mmHg Patient Gender: F                HR:           86 bpm. Exam Location:  [facility] Procedure: 2D Echo, Cardiac Doppler, Color Doppler and Saline Contrast Bubble            Study Indications:    Stroke  History:        Patient has prior history of Echocardiogram examinations, most                 recent 01/16/2022. Stroke; Risk Factors:Hypertension, Diabetes,                 Dyslipidemia and Former Smoker. Multiple myleoma, Port-a-cath in                 place.  Sonographer:    Wenda Low Referring Phys: [id number] JAN A MANSY  Sonographer Comments: Patient is obese. IMPRESSIONS  1. Left ventricular ejection fraction, by estimation, is 60 to 65%. The left ventricle has normal function. The left ventricle has no regional wall motion abnormalities. There is moderate asymmetric left ventricular hypertrophy of the septal segment. Left ventricular diastolic parameters are consistent with Grade I diastolic dysfunction (impaired relaxation).  2. Right ventricular systolic function is normal. The right ventricular size is normal. There is normal pulmonary artery systolic pressure. The estimated right ventricular systolic pressure is 45.4 mmHg.  3. The mitral valve is grossly normal. Trivial mitral valve regurgitation.  4. The aortic valve is tricuspid. Aortic valve regurgitation is mild. Aortic regurgitation PHT measures 603 msec.  5. Aortic dilatation noted. There is mild dilatation of the ascending aorta, measuring 41 mm.  6. The inferior vena cava is normal in size with <50% respiratory variability, suggesting right atrial pressure of 8 mmHg.  7. Agitated saline contrast bubble study was negative, with no evidence of right to left interatrial shunt. Comparison(s): Prior images reviewed side by side. LVEF remains normal range at 60-65%. Mildly dilated ascending aorta  with mild aortic regurgitation. FINDINGS  Left Ventricle: Left ventricular ejection fraction, by estimation, is 60 to 65%. The left ventricle has normal function. The left ventricle has no regional wall motion abnormalities. The left ventricular internal cavity size was normal in size. There is  moderate asymmetric left ventricular hypertrophy of the septal segment. Left ventricular diastolic parameters are consistent with Grade I diastolic dysfunction (impaired relaxation). Right Ventricle: The right ventricular size is normal. No increase in right ventricular wall thickness. Right ventricular systolic function is normal. There is normal pulmonary artery systolic pressure. The tricuspid regurgitant velocity is 2.20 m/s, and  with an assumed right atrial pressure of 8 mmHg, the estimated right ventricular systolic pressure is 09.8 mmHg. Left Atrium: Left atrial size was normal in size. Right Atrium: Right atrial size was normal in size. Pericardium: There is no evidence of pericardial effusion. Mitral Valve: The mitral valve is grossly normal. Trivial mitral valve regurgitation. MV peak gradient, 4.1 mmHg. The mean mitral valve gradient is 2.0 mmHg. Tricuspid Valve: The tricuspid valve is grossly normal. Tricuspid valve regurgitation is trivial. Aortic Valve: The aortic valve is tricuspid. Aortic valve regurgitation is mild. Aortic regurgitation PHT measures 603 msec. Aortic valve mean gradient measures 5.0 mmHg. Aortic valve peak gradient measures 12.4 mmHg. Aortic valve area,  by VTI measures 2.95 cm. Pulmonic Valve: The pulmonic valve was grossly normal. Pulmonic valve regurgitation is trivial. Aorta: The aortic root is normal in size and structure and aortic dilatation noted. There is mild dilatation of the ascending aorta, measuring 41 mm. Venous: The inferior vena cava is normal in size with less than 50% respiratory variability, suggesting right atrial pressure of 8 mmHg. IAS/Shunts: No atrial level shunt  detected by color flow Doppler. Agitated saline contrast was given intravenously to evaluate for intracardiac shunting. Agitated saline contrast bubble study was negative, with no evidence of any interatrial shunt.  LEFT VENTRICLE PLAX 2D LVIDd:         5.20 cm   Diastology LVIDs:         3.30 cm   LV e' medial:    5.22 cm/s LV PW:         1.30 cm   LV E/e' medial:  15.2 LV IVS:        1.50 cm   LV e' lateral:   7.62 cm/s LVOT diam:     2.10 cm   LV E/e' lateral: 10.4 LV SV:         93 LV SV Index:   44 LVOT Area:     3.46 cm  RIGHT VENTRICLE RV Basal diam:  2.70 cm RV Mid diam:    2.20 cm RV S prime:     14.80 cm/s LEFT ATRIUM             Index        RIGHT ATRIUM           Index LA diam:        4.30 cm 2.03 cm/m   RA Area:     13.20 cm LA Vol (A2C):   62.0 ml 29.27 ml/m  RA Volume:   24.40 ml  11.52 ml/m LA Vol (A4C):   46.4 ml 21.90 ml/m LA Biplane Vol: 54.2 ml 25.59 ml/m  AORTIC VALVE                    PULMONIC VALVE AV Area (Vmax):    2.81 cm     PV Vmax:       1.29 m/s AV Area (Vmean):   3.00 cm     PV Peak grad:  6.7 mmHg AV Area (VTI):     2.95 cm AV Vmax:           176.00 cm/s AV Vmean:          95.700 cm/s AV VTI:            0.315 m AV Peak Grad:      12.4 mmHg AV Mean Grad:      5.0 mmHg LVOT Vmax:         143.00 cm/s LVOT Vmean:        83.000 cm/s LVOT VTI:          0.268 m LVOT/AV VTI ratio: 0.85 AI PHT:            603 msec  AORTA Ao Root diam: 3.70 cm Ao Asc diam:  4.10 cm MITRAL VALVE                TRICUSPID VALVE MV Area (PHT): 5.06 cm     TR Peak grad:   19.4 mmHg MV Area VTI:   3.14 cm     TR Vmax:        220.00 cm/s MV Peak grad:  4.1 mmHg MV Mean grad:  2.0 mmHg     SHUNTS MV Vmax:       1.01 m/s     Systemic VTI:  0.27 m MV Vmean:      69.0 cm/s    Systemic Diam: 2.10 cm MV Decel Time: 150 msec MV E velocity: 79.50 cm/s MV A velocity: 114.00 cm/s MV E/A ratio:  0.70 Rozann Lesches MD Electronically signed by Rozann Lesches MD Signature Date/Time: 06/19/2022/10:34:16 AM    Final     MR BRAIN W WO CONTRAST  Result Date: 06/18/2022 CLINICAL DATA:  Provided history: Neuro deficit, acute, stroke suspected. Mental status change, unknown cause. EXAM: MRI HEAD WITHOUT AND WITH CONTRAST MRA HEAD WITHOUT CONTRAST TECHNIQUE: Multiplanar, multi-echo pulse sequences of the brain and surrounding structures were acquired without and with intravenous contrast. Angiographic images of the Circle of Willis were acquired using MRA technique without intravenous contrast. CONTRAST:  53m GADAVIST GADOBUTROL 1 MMOL/ML IV SOLN COMPARISON:  Brain MRI 06/12/2022. CT angiogram head/neck 12/26/2019. FINDINGS: MRI HEAD FINDINGS Intermittently motion degraded examination, limiting evaluation. Most notably, the axial T2 FLAIR sequence is moderately motion degraded, the coronal T2 and T2 FLAIR sequences through the hippocampi are mild to moderately motion degraded and the sagittal T1 weighted postcontrast sequence is moderately motion degraded. Brain: No age advanced or lobar predominant parenchymal atrophy. Redemonstrated moderate-sized chronic cortical/subcortical infarct within the left parietal lobe (posterior left MCA territory and left MCA/PCA watershed territory). Chronic hemosiderin deposition within portions of this infarct territory. New from the prior brain MRI of 06/12/2022, there is cortical diffusion-weighted signal abnormality along the margins of this chronic infarct (within the right parietal lobe, at the left temporoparietal junction and within the lateral left occipital lobe) (for instance as seen on series 9, images 17 and 23). Subtle diffusion weighted signal abnormality is also suspected within the posteromedial left thalamus (series 9, image 17). Findings are suspicious for seizure related changes, although acute infarcts are also a consideration. Redemonstrated tiny foci of diffusion-weighted signal abnormality and T2 FLAIR hyperintense signal abnormality measuring up to 3 mm within the  anterior right corona radiata (series 11, image 17) and left occipital lobe white matter (series 9, image 16). These were present on the prior brain MRI of 06/12/2022, and likely reflect small subacute infarcts (right MCA territory and left PCA territory, respectively). New 2 mm acute white matter infarct immediately lateral to the right caudate nucleus (series 9, image 19). Background multifocal T2 FLAIR hyperintense signal abnormality within the cerebral white matter (mild) and within the pons (moderate). Unchanged small chronic infarcts within the bilateral cerebellar hemispheres. No evidence of an intracranial mass. No extra-axial fluid collection. No midline shift. No definite pathologic intracranial enhancement. Vascular: Maintained flow voids within the proximal large arterial vessels. Skull and upper cervical spine: Heterogeneous bone marrow signal with numerous foci of osseous enhancement. These findings are compatible with widespread multiple myeloma. Incompletely assessed cervical spondylosis. Sinuses/Orbits: No mass or acute finding within the imaged orbits. No significant paranasal sinus disease. Other: Trace fluid within the bilateral mastoid air cells. MRA HEAD FINDINGS Mildly motion degraded exam. Anterior circulation: The intracranial internal carotid arteries are patent. Mild atherosclerotic irregularity of both vessels. The M1 middle cerebral arteries are patent. Atherosclerotic irregularity of the M2 and more distal MCA vessels, bilaterally. No M2 proximal branch occlusion or high-grade proximal stenosis. The anterior cerebral arteries are patent. Atherosclerotic irregularity of both vessels. Most notably, there is an apparent moderate/severe stenosis within the proximal right ACA  A2 segment, which is new from the prior CTA of 12/26/2019. Posterior circulation: The intracranial vertebral arteries are patent. The basilar artery is patent. Mild atherosclerotic irregularity of the basilar artery.  The posterior cerebral arteries are patent. Atherosclerotic irregularity of both vessels. Most notably, there are sites of up to moderate stenosis within left PCA branches at P2/P3 junction, progressed (series 1033, image 2) and a progressive moderate stenosis within a right PCA branch at the P2/P3 junction. A left posterior communicating artery is present. The right posterior communicating artery is diminutive or absent Anatomic variants: As described. MRI brain impression #2 and #3 will be called to the ordering clinician or representative by the Radiologist Assistant, and communication documented in the PACS or [employer]. IMPRESSION: MRI brain: 1. Intermittently motion degraded exam. 2. Foci of cortical diffusion-weighted signal abnormality along the margins of a chronic left parietal lobe infarct (within the left parietal lobe, at the left temporoparietal junction and within the lateral left occipital lobe). Subtle diffusion-weighted signal abnormality is also suspected within the posteromedial left thalamus. These findings are suspicious for seizure-related changes. However, alternatively, these may reflect acute infarcts. 3. New 2 mm acute white matter infarct immediately lateral to the right caudate head. 4. Redemonstrated tiny subacute infarcts within the anterior right corona radiata and left occipital lobe white matter (right MCA and left PCA vascular territories, respectively). 5. Background chronic small vessel ischemic changes, as described and unchanged from the recent prior brain MRI of 06/12/2022. 6. Redemonstrated small chronic infarcts within both cerebellar hemispheres. 7. Widespread multiple myeloma. MRA head: 1. No intracranial large vessel occlusion is identified. 2. Intracranial atherosclerotic disease with multifocal stenoses, most notably as follows. 3. Moderate/severe stenosis within the right ACA proximal A2 segment, new from the prior CTA head/neck of 12/26/2019. 4. Sites of up to  moderate stenosis within left PCA branches at the P2/P3 junction, progressed. 5. Moderate stenosis within a right PCA branch at the P2/P3 junction, progressed. Electronically Signed   By: Kellie Simmering D.O.   On: 06/18/2022 19:39   MR ANGIO HEAD WO CONTRAST  Result Date: 06/18/2022 CLINICAL DATA:  Provided history: Neuro deficit, acute, stroke suspected. Mental status change, unknown cause. EXAM: MRI HEAD WITHOUT AND WITH CONTRAST MRA HEAD WITHOUT CONTRAST TECHNIQUE: Multiplanar, multi-echo pulse sequences of the brain and surrounding structures were acquired without and with intravenous contrast. Angiographic images of the Circle of Willis were acquired using MRA technique without intravenous contrast. CONTRAST:  68m GADAVIST GADOBUTROL 1 MMOL/ML IV SOLN COMPARISON:  Brain MRI 06/12/2022. CT angiogram head/neck 12/26/2019. FINDINGS: MRI HEAD FINDINGS Intermittently motion degraded examination, limiting evaluation. Most notably, the axial T2 FLAIR sequence is moderately motion degraded, the coronal T2 and T2 FLAIR sequences through the hippocampi are mild to moderately motion degraded and the sagittal T1 weighted postcontrast sequence is moderately motion degraded. Brain: No age advanced or lobar predominant parenchymal atrophy. Redemonstrated moderate-sized chronic cortical/subcortical infarct within the left parietal lobe (posterior left MCA territory and left MCA/PCA watershed territory). Chronic hemosiderin deposition within portions of this infarct territory. New from the prior brain MRI of 06/12/2022, there is cortical diffusion-weighted signal abnormality along the margins of this chronic infarct (within the right parietal lobe, at the left temporoparietal junction and within the lateral left occipital lobe) (for instance as seen on series 9, images 17 and 23). Subtle diffusion weighted signal abnormality is also suspected within the posteromedial left thalamus (series 9, image 17). Findings are  suspicious for seizure related changes, although acute infarcts  are also a consideration. Redemonstrated tiny foci of diffusion-weighted signal abnormality and T2 FLAIR hyperintense signal abnormality measuring up to 3 mm within the anterior right corona radiata (series 11, image 17) and left occipital lobe white matter (series 9, image 16). These were present on the prior brain MRI of 06/12/2022, and likely reflect small subacute infarcts (right MCA territory and left PCA territory, respectively). New 2 mm acute white matter infarct immediately lateral to the right caudate nucleus (series 9, image 19). Background multifocal T2 FLAIR hyperintense signal abnormality within the cerebral white matter (mild) and within the pons (moderate). Unchanged small chronic infarcts within the bilateral cerebellar hemispheres. No evidence of an intracranial mass. No extra-axial fluid collection. No midline shift. No definite pathologic intracranial enhancement. Vascular: Maintained flow voids within the proximal large arterial vessels. Skull and upper cervical spine: Heterogeneous bone marrow signal with numerous foci of osseous enhancement. These findings are compatible with widespread multiple myeloma. Incompletely assessed cervical spondylosis. Sinuses/Orbits: No mass or acute finding within the imaged orbits. No significant paranasal sinus disease. Other: Trace fluid within the bilateral mastoid air cells. MRA HEAD FINDINGS Mildly motion degraded exam. Anterior circulation: The intracranial internal carotid arteries are patent. Mild atherosclerotic irregularity of both vessels. The M1 middle cerebral arteries are patent. Atherosclerotic irregularity of the M2 and more distal MCA vessels, bilaterally. No M2 proximal branch occlusion or high-grade proximal stenosis. The anterior cerebral arteries are patent. Atherosclerotic irregularity of both vessels. Most notably, there is an apparent moderate/severe stenosis within the  proximal right ACA A2 segment, which is new from the prior CTA of 12/26/2019. Posterior circulation: The intracranial vertebral arteries are patent. The basilar artery is patent. Mild atherosclerotic irregularity of the basilar artery. The posterior cerebral arteries are patent. Atherosclerotic irregularity of both vessels. Most notably, there are sites of up to moderate stenosis within left PCA branches at P2/P3 junction, progressed (series 1033, image 2) and a progressive moderate stenosis within a right PCA branch at the P2/P3 junction. A left posterior communicating artery is present. The right posterior communicating artery is diminutive or absent Anatomic variants: As described. MRI brain impression #2 and #3 will be called to the ordering clinician or representative by the Radiologist Assistant, and communication documented in the PACS or [employer]. IMPRESSION: MRI brain: 1. Intermittently motion degraded exam. 2. Foci of cortical diffusion-weighted signal abnormality along the margins of a chronic left parietal lobe infarct (within the left parietal lobe, at the left temporoparietal junction and within the lateral left occipital lobe). Subtle diffusion-weighted signal abnormality is also suspected within the posteromedial left thalamus. These findings are suspicious for seizure-related changes. However, alternatively, these may reflect acute infarcts. 3. New 2 mm acute white matter infarct immediately lateral to the right caudate head. 4. Redemonstrated tiny subacute infarcts within the anterior right corona radiata and left occipital lobe white matter (right MCA and left PCA vascular territories, respectively). 5. Background chronic small vessel ischemic changes, as described and unchanged from the recent prior brain MRI of 06/12/2022. 6. Redemonstrated small chronic infarcts within both cerebellar hemispheres. 7. Widespread multiple myeloma. MRA head: 1. No intracranial large vessel occlusion is  identified. 2. Intracranial atherosclerotic disease with multifocal stenoses, most notably as follows. 3. Moderate/severe stenosis within the right ACA proximal A2 segment, new from the prior CTA head/neck of 12/26/2019. 4. Sites of up to moderate stenosis within left PCA branches at the P2/P3 junction, progressed. 5. Moderate stenosis within a right PCA branch at the P2/P3 junction, progressed. Electronically  Signed   By: Kellie Simmering D.O.   On: 06/18/2022 19:39   DG Chest Port 1 View  Result Date: 06/18/2022 CLINICAL DATA:  Possible sepsis EXAM: PORTABLE CHEST 1 VIEW COMPARISON:  06/08/22 CXR FINDINGS: Right chest part with the tip at the cavoatrial junction. Unchanged cardiac and mediastinal contours. No pleural effusion. No pneumothorax. Bibasilar atelectasis. Visualized upper abdomen is unremarkable.No displaced rib fractures. IMPRESSION: Bibasilar atelectasis. Electronically Signed   By: Marin Roberts M.D.   On: 06/18/2022 12:12    Scheduled Meds:  acyclovir  400 mg Oral BID   allopurinol  300 mg Oral Daily   amLODipine  2.5 mg Oral Daily   aspirin EC  325 mg Oral Daily   carvedilol  25 mg Oral BID   Chlorhexidine Gluconate Cloth  6 each Topical Daily   diclofenac Sodium  2 g Topical QID   docusate sodium  100 mg Oral Daily   escitalopram  10 mg Oral Daily   fluticasone  2 spray Each Nare Daily   losartan  100 mg Oral Daily   pantoprazole  40 mg Oral Daily   pregabalin  200 mg Oral BID   rosuvastatin  20 mg Oral Daily   Continuous Infusions:  lactated ringers 70 mL/hr at 06/19/22 0700   levETIRAcetam     levETIRAcetam       LOS: 1 day   Critical Care Procedure Note Authorized and Performed by: Murvin Natal MD  Total Critical Care time:  63 mins Due to a high probability of clinically significant, life threatening deterioration, the patient required my highest level of preparedness to intervene emergently and I personally spent this critical care time directly and personally  managing the patient.  This critical care time included obtaining a history; examining the patient, pulse oximetry; ordering and review of studies; arranging urgent treatment with development of a management plan; evaluation of patient's response of treatment; frequent reassessment; and discussions with other providers.  This critical care time was performed to assess and manage the high probability of imminent and life threatening deterioration that could result in multi-organ failure.  It was exclusive of separately billable procedures and treating other patients and teaching time.    Irwin Brakeman, MD How to contact the [facility] Attending or Consulting provider [place] or covering provider during after hours [place], for this patient?  Check the care team in [facility] and look for a) attending/consulting TRH provider listed and b) the [facility] team listed Log into [email or web address] and use [place] universal password to access. If you do not have the password, please contact the hospital operator. Locate the [facility] provider you are looking for under [facility] and page to a number that you can be directly reached. If you still have difficulty reaching the provider, please page the [facility] (Director on Call) for the Hospitalists listed on amion for assistance.  06/19/2022, 11:49 AM

## 2022-06-19 NOTE — Progress Notes (Signed)
Verbal Report given to nurse at [facility], [facility] here at bedside to transport, patient in stable condition, spouse has collected all belongings.

## 2022-06-19 NOTE — Progress Notes (Signed)
Patient had neurology consult via video with family at bedside at 47 am. Neurology recommended this patient be transferred to [facility] for further evaluation by neurology, spouse of patient aware and in agreement. Request made for transfer to [facility], awaiting bed. Patient resting quietly with family at bedside with call bell in reach.

## 2022-06-19 NOTE — Progress Notes (Signed)
Cardiac nurse has arrived for bubble study.

## 2022-06-19 NOTE — Progress Notes (Signed)
Preliminary EEG reviewed with Dr. April Manson.  She has left-sided LPD's 0.5 to 1 Hz with no sustained evolution to cause seizures but it still is on ictal interictal continuum. I will start lacosamide 200 twice daily. She is on Keppra 1500 twice daily. She has also been given a load of fosphenytoin. I will start Dilantin 100 3 times daily  Impression: Status epilepticus  Recommendations - Keppra 1500 twice daily - Dilantin 100 mg 3 times daily - Vimpat 200 mg twice daily - If she remains somnolent and the EEG does not show changes after 3 antiepileptics and still remains highly concerning for possible development into seizures, will consult critical care for intubation and start her on propofol or Versed drip with the aim for burst suppression.   20 minutes of additional critical care time.  -- Amie Portland, MD Neurologist [facility] Pager: [phone]

## 2022-06-19 NOTE — Progress Notes (Signed)
Called to see if pt is being transferred to [facility].

## 2022-06-20 DIAGNOSIS — R569 Unspecified convulsions: Secondary | ICD-10-CM | POA: Diagnosis not present

## 2022-06-20 DIAGNOSIS — G40901 Epilepsy, unspecified, not intractable, with status epilepticus: Secondary | ICD-10-CM | POA: Diagnosis not present

## 2022-06-20 DIAGNOSIS — R531 Weakness: Secondary | ICD-10-CM | POA: Diagnosis not present

## 2022-06-20 LAB — CBC
HCT: 21.4 % — ABNORMAL LOW (ref 36.0–46.0)
Hemoglobin: 7.3 g/dL — ABNORMAL LOW (ref 12.0–15.0)
MCH: 29.9 pg (ref 26.0–34.0)
MCHC: 34.1 g/dL (ref 30.0–36.0)
MCV: 87.7 fL (ref 80.0–100.0)
Platelets: 16 10*3/uL — CL (ref 150–400)
RBC: 2.44 MIL/uL — ABNORMAL LOW (ref 3.87–5.11)
RDW: 18.4 % — ABNORMAL HIGH (ref 11.5–15.5)
WBC: 2.6 10*3/uL — ABNORMAL LOW (ref 4.0–10.5)
nRBC: 0.8 % — ABNORMAL HIGH (ref 0.0–0.2)

## 2022-06-20 LAB — BASIC METABOLIC PANEL
Anion gap: 9 (ref 5–15)
BUN: 19 mg/dL (ref 6–20)
CO2: 24 mmol/L (ref 22–32)
Calcium: 7.6 mg/dL — ABNORMAL LOW (ref 8.9–10.3)
Chloride: 105 mmol/L (ref 98–111)
Creatinine, Ser: 0.49 mg/dL (ref 0.44–1.00)
GFR, Estimated: >60 mL/min (ref >60)
Glucose, Bld: 95 mg/dL (ref 70–99)
Potassium: 3.2 mmol/L — ABNORMAL LOW (ref 3.5–5.1)
Sodium: 138 mmol/L (ref 135–145)

## 2022-06-20 LAB — MAGNESIUM: Magnesium: 2.1 mg/dL (ref 1.7–2.4)

## 2022-06-20 LAB — HEMOGLOBIN A1C
Hgb A1c MFr Bld: 7.2 % — ABNORMAL HIGH (ref 4.8–5.6)
Mean Plasma Glucose: 160 mg/dL

## 2022-06-20 MED ORDER — POTASSIUM CHLORIDE 10 MEQ/100ML IV SOLN
10.0000 meq | INTRAVENOUS | Status: DC
  Administered 2022-06-20: 10 meq via INTRAVENOUS
  Filled 2022-06-20: qty 100

## 2022-06-20 MED ORDER — POTASSIUM CHLORIDE 10 MEQ/100ML IV SOLN
10.0000 meq | INTRAVENOUS | Status: AC
  Administered 2022-06-20 (×3): 10 meq via INTRAVENOUS
  Filled 2022-06-20 (×3): qty 100

## 2022-06-20 MED ORDER — PHENOBARBITAL SODIUM 65 MG/ML IJ SOLN
65.0000 mg | Freq: Two times a day (BID) | INTRAMUSCULAR | Status: DC
  Administered 2022-06-20 – 2022-06-26 (×12): 65 mg via INTRAVENOUS
  Filled 2022-06-20 (×12): qty 1

## 2022-06-20 NOTE — Procedures (Signed)
Patient Name: [name], [name] Epilepsy Attending: Su Monks MD Referring Physician/Provider: Dr. Kerney Elbe  Duration: 6389 on 06/19/22 to 1701 on 06/20/22   Patient history:  Joanna Reid is a 56 y.o. female with a history of seizure who is undergoing an EEG to evaluate for seizures.    Level of alertness: awake and asleep   Technical aspects: This EEG study was done with scalp electrodes positioned according to the 10-20 International system of electrode placement. Electrical activity was reviewed with band pass filter of 1-'70Hz'$ , sensitivity of 7 uV/mm, display speed of 28m/sec with a '60Hz'$  notched filter applied as appropriate. EEG data were recorded continuously and digitally stored.  Video monitoring was available and reviewed as appropriate.   Description: The best background was 4-5 Hz. This activity is reactive to stimulation. Drowsiness was manifested by background fragmentation; deeper stages of sleep were identified by K complexes and sleep spindles. There was focal slowing over the left hemisphere.  Throughout the recording patient had continuous L sided lateralized periodic discharges at 0.5-1.0 Hz with no sustained evolution. There were no definitive electrographic seizures during this recording.  Photic stimulation and hyperventilation were not performed.  Patient event button was pushed at [facility] unclear reasons. There was no change in background before, during, or after the event.     ABNORMALITY & CLINICAL CORRELATION - continuous L sided lateralized periodic discharges at 0.5-1.0 Hz with no sustained evolution therefore not meeting criteria for electrographic seizure. Thes pattern is however high on the ictal-interictal continuum with high risk for seizure.  - moderate diffuse slowing indicative of global cerebral dysfunction - left focal slowing indicative of focal cerebral dysfunction in that region  Neurology will be notified of these findings. Recommend  continuation of LTM.   CSu Monks MD [facility] [phone] If 7pm- 7am, please page neurology on call as listed in [facility]

## 2022-06-20 NOTE — Evaluation (Signed)
Clinical/Bedside Swallow Evaluation Patient Details  Name: Joanna Reid MRN: [id number] Date of Birth: December 23, 1965  Today's Date: 06/20/2022 Time: SLP Start Time (ACUTE ONLY): 1500 SLP Stop Time (ACUTE ONLY): 1520 SLP Time Calculation (min) (ACUTE ONLY): 20 min  Past Medical History:  Past Medical History:  Diagnosis Date   Acid reflux    Allergic rhinitis    Cancer ([place])    multiple myeloma   Diabetes mellitus    type 2   Gout    Gout    H/o COVID-19--- was Positive 09/17/2019, Negative 12/23/19 AND also Neg on  12/26/19 12/26/2019   HBP (high blood pressure)    History of kidney stones    Migraines    Multiple myeloma ([place]) 10/29/2017   Past Surgical History:  Past Surgical History:  Procedure Laterality Date   BREAST CYST EXCISION Left    2009 no visible scar on skin   CESAREAN SECTION     COLONOSCOPY WITH PROPOFOL N/A 12/25/2019   Procedure: COLONOSCOPY WITH PROPOFOL;  Surgeon: Rogene Houston, MD;  [facility]: [facility];  Service: Endoscopy;  Laterality: N/A;  730   EXTRACORPOREAL SHOCK WAVE LITHOTRIPSY Left 10/10/2017   Procedure: LEFT EXTRACORPOREAL SHOCK WAVE LITHOTRIPSY (ESWL);  Surgeon: Bjorn Loser, MD;  Location: [facility] OR[facility];  Service: Urology;  Laterality: Left;   EYE SURGERY     HEMORRHOID SURGERY N/A 11/19/2012   Procedure: HEMORRHOIDECTOMY;  Surgeon: Jamesetta So, MD;  Location: [facility] ORS;  Service: General;  Laterality: N/A;   IR IMAGING GUIDED PORT INSERTION  01/18/2022   kidney stones  1998   LAPAROSCOPIC UNILATERAL SALPINGO OOPHERECTOMY  05/14/2012   Procedure: LAPAROSCOPIC UNILATERAL SALPINGO OOPHORECTOMY;  Surgeon: [name], MD;  Location: [facility];  Service: Gynecology;  Laterality: Right;  laparoscopic right salpingo-oophorectomy   PARTIAL HYSTERECTOMY     POLYPECTOMY  12/25/2019   Procedure: POLYPECTOMY;  Surgeon: Rogene Houston, MD;  [facility]: [facility];  Service: Endoscopy;;   TONSILECTOMY, ADENOIDECTOMY, BILATERAL MYRINGOTOMY AND TUBES      VESICOVAGINAL FISTULA CLOSURE W/ TAH     HPI:  56 year old female with refractory multiple myeloma status post relapse, hypertension, gout, GERD, type 2 diabetes mellitus, migraine headaches, stroke with residual aphasia presented to ED today due to ongoing weakness and lethargy.  She reportedly last had chemotherapy 2 weeks ago.  She has recently received PRBC and platelets in the cancer center.   Her work up revealed a K of 2.5, Hg was 7.7, WBC 2.6, platelets 15.  She noted no spontaneous bleeding. Pt had a seizure while being evaluated by MD in the ED. MRI increased diffusion signal in the left thalamus and parietal lobe. She also has a small incidental stroke in the right caudate. BSE requested.    Assessment / Plan / Recommendation  Clinical Impression  Pt seen at bedside for re-evaluation of swallow function and safety. Initial swallow evaluation at [facility] recommended NPO except ice chips. Pt's husband and mother were present today. Oral care was completed with suction. During this evaluation, pt was profoundly weak, and unable to generate a volitional cough. She exhibited poor oral control and manipulation of ice chips. No cough response was elicited following ice chip presentation, however, pt is at very high risk of aspiration at this time. Recommend NPO status, including ice chips until pt is more alert. SLP will follow and reassess readiness for PO intake or instrumental study. RN and MD informed.  SLP Visit Diagnosis: Dysphagia, unspecified (R13.10)  Aspiration Risk  Moderate aspiration risk    Diet Recommendation NPO   Medication Administration: Via alternative means    Other  Recommendations Oral Care Recommendations: Staff/trained caregiver to provide oral care;Oral care QID    Recommendations for follow up therapy are one component of a multi-disciplinary discharge planning process, led by the attending physician.  Recommendations may be updated based on patient status,  additional functional criteria and insurance authorization.  Follow up Recommendations Acute inpatient rehab (3hours/day)      Assistance Recommended at Discharge  TBD  Functional Status Assessment Patient has had a recent decline in their functional status and demonstrates the ability to make significant improvements in function in a reasonable and predictable amount of time.   Frequency and Duration min 2x/week  1 week;2 weeks       Prognosis Prognosis for Safe Diet Advancement: Fair Barriers to Reach Goals: Severity of deficits      Swallow Study   General Date of Onset: 06/18/22 HPI: 56 year old female with refractory multiple myeloma status post relapse, hypertension, gout, GERD, type 2 diabetes mellitus, migraine headaches, stroke with residual aphasia presented to ED today due to ongoing weakness and lethargy.  She reportedly last had chemotherapy 2 weeks ago.  She has recently received PRBC and platelets in the cancer center.   Her work up revealed a K of 2.5, Hg was 7.7, WBC 2.6, platelets 15.  She noted no spontaneous bleeding. Pt had a seizure while being evaluated by MD in the ED. MRI increased diffusion signal in the left thalamus and parietal lobe. She also has a small incidental stroke in the right caudate. BSE requested. Type of Study: Bedside Swallow Evaluation Previous Swallow Assessment: BSE @ [facility] 06/19/22 Diet Prior to this Study: Regular;Thin liquids Temperature Spikes Noted: No Respiratory Status: Nasal cannula History of Recent Intubation: No Behavior/Cognition: Cooperative;Lethargic/Drowsy;Requires cueing;Doesn't follow directions Oral Cavity Assessment: Within Functional Limits Oral Care Completed by SLP: Yes Oral Cavity - Dentition: Adequate natural dentition Self-Feeding Abilities: Total assist Patient Positioning: Upright in bed Baseline Vocal Quality: Normal Volitional Cough: Cognitively unable to elicit Volitional Swallow: Able to elicit     Oral/Motor/Sensory Function Overall Oral Motor/Sensory Function: Generalized oral weakness   Ice Chips Ice chips: Impaired Presentation: Spoon Oral Phase Impairments: Reduced labial seal;Reduced lingual movement/coordination;Poor awareness of bolus Oral Phase Functional Implications: Right anterior spillage Pharyngeal Phase Impairments: Suspected delayed Swallow;Decreased hyoid-laryngeal movement   Thin Liquid Thin Liquid: Not tested    Nectar Thick Nectar Thick Liquid: Not tested   Honey Thick Honey Thick Liquid: Not tested   Puree Puree: Not tested   Solid     Solid: Not tested     Aikam Hellickson B. Quentin Ore, [facility], [place] Speech Language Pathologist Office: [phone]  Shonna [name] 06/20/2022,3:38 PM

## 2022-06-20 NOTE — Progress Notes (Signed)
LTM maint complete - no skin breakdown under: F3,P8.

## 2022-06-20 NOTE — Progress Notes (Signed)
PT Missed Evaluation Note  Patient Details Name: Joanna Reid MRN: [id number] DOB: [birth year]   Cancelled Treatment:    Reason Eval/Treat Not Completed: Medical issues which prohibited therapy;Patient not medically ready;Other (comment) (Per nursing pt is currently continuing to experience significant seizures and is not appropriate for physical therapy.) Will continue to follow-up as able and appropriate  Tomma Rakers, DPT, [place] Office: [phone] (Secure chat preferred)    Ander Purpura 06/20/2022, 2:06 PM

## 2022-06-20 NOTE — Procedures (Signed)
Routine EEG Report  Joanna Reid is a 56 y.o. female with a history of seizure who is undergoing an EEG to evaluate for seizures.  Report: This EEG was acquired with electrodes placed according to the International 10-20 electrode system (including Fp1, Fp2, F3, F4, C3, C4, P3, P4, O1, O2, T3, T4, T5, T6, A1, A2, Fz, Cz, Pz). The following electrodes were missing or displaced: none.  The best background was 6 Hz with superimposed L focal slowing. This activity is reactive to stimulation. Drowsiness was manifested by background fragmentation; deeper stages of sleep were not identified. There were continuous L LPDs, at times 0.5-1 Hz, and at times accelerating to 2-3 Hz with subtle evolution concerning for possible seizure (2-3 times in this routine recording). Photic stimulation and hyperventilation were not performed.   Impression and clinical correlation: This EEG was obtained while awake and drowsy and is abnormal due to: - moderate diffuse slowing indicative of global cerebral dysfunction - Continuous L lateralized periodic discharges, at times 0.5-1 Hz, and at times accelerating to 2-3 Hz with subtle evolution concerning for possible seizure. This pattern is on the ictal-interictal continuum with high risk for seizures. Prolonged EEG for further characterization is recommended.  Su Monks, MD [facility] [phone]  If 7pm- 7am, please page neurology on call as listed in [place].

## 2022-06-20 NOTE — Progress Notes (Signed)
PROGRESS NOTE   Joanna Reid  UXL:[id number] DOB: 01-10-1966 DOA: 06/18/2022 PCP: Lindell Spar, MD   Chief Complaint  Patient presents with   Weakness   Level of care: Progressive  Brief Admission History:  56 year old female with refractory multiple myeloma status post relapse, hypertension, gout, GERD, type 2 diabetes mellitus, migraine headaches, stroke with residual aphasia presented to ED for ongoing weakness and lethargy. Last had chemotherapy 2 weeks ago.  She has recently received PRBC and platelets in the cancer center.  While in the ED patient had acute mental status change, unarousable with full tonic-clonic seizure lasting 2 to 3 minutes which improved with lorazepam.  Transferred to [place] for further neuro evaluation and follow-up as well as continuous EEG.   Assessment and Plan:  New Onset Partial Status Epilepticus  -Neurology following, EEG ongoing -Continue Keppra, as needed benzos for any further acute breakthrough seizure  Cerebrovascular Disease Acute CVA in right caudate  -Continue aspirin and statin -Neurology following as above -TTE mild grade 1 diastolic dysfunction, bubble study negative -A1c 7.2, TSH 2.0 - permissive hypertension x 48 hours  - SLP/PT/OT evaluation  Severe Hypomagnesemia Hypokalemia -Magnesium 0.8 at intake, repleted, repeat 2.1 -Potassium 3.2 add 40 mEq IV x 1  Refractory Multiple Myeloma  - treated by Dr. Delton Coombes - last chem given reportedly 2 weeks ago  Pancytopenia  -Secondary to above - s/p 1 unit PRBC recommended by Dr. Federico Flake - Hg up to 8.4 - Platelets 18>>16 -no signs or symptoms of bleeding, hold off on further transfusion at this time - WBC 3.6   DVT prophylaxis: SCD only given above thrombocytopenia Code Status: full  Family Communication: spouse and daughter updated at bedside/over the phone Disposition: Status is: Inpatient Remains inpatient appropriate given above ongoing need for  evaluation, EEG IV medications   Consultants:  Neurology   Procedures:   Antimicrobials:    Subjective: Able to follow simple commands in a limited fashion, review of systems somewhat limited due to patient's verbal status  Objective: Vitals:   06/19/22 1552 06/19/22 2049 06/20/22 0058 06/20/22 0427  BP: (!) 155/102 (!) 150/93 125/84 (!) 148/95  Pulse: 92 79 77 78  Resp:  (!) _0 Temp: 97.7 F (36.5 C) 98.1 F (36.7 C) 98.3 F (36.8 C) (!) 97.4 F (36.3 C)  TempSrc: Oral Oral  Oral  SpO2: 99%  100% 100%  Weight:      Height:        Intake/Output Summary (Last 24 hours) at 06/20/2022 0736 Last data filed at 06/20/2022 0656 Gross per 24 hour  Intake 1146.98 ml  Output 1025 ml  Net 121.98 ml    Filed Weights   06/18/22 1128 06/18/22 1728 06/19/22 0500  Weight: 107 kg 109.3 kg 109.4 kg   Examination:  General exam: pt minimally interactive with family and staff but improved from yesterday; somnolent;  not following commands.  Respiratory system: Clear to auscultation. Respiratory effort normal. Cardiovascular system: normal S1 & S2 heard. No JVD, murmurs, rubs, gallops or clicks. 2+ pedal edema. Gastrointestinal system: Abdomen is nondistended, soft and nontender. No organomegaly or masses felt. Normal bowel sounds heard. Central nervous system: somnolent, not following commands, mild twitching in lower extremities seen; facial droop present;   Extremities: 1+ edema BLEs.  Skin: No rashes, lesions or ulcers.  Data Reviewed: I have personally reviewed following labs and imaging studies  CBC: Recent Labs  Lab 06/18/22 1217 06/19/22 0416 06/20/22 0400  WBC 2.6* 3.6* 2.6*  NEUTROABS 1.3*  --   --   HGB 7.7* 8.4* 7.3*  HCT 22.5* 25.5* 21.4*  MCV 87.2 91.1 87.7  PLT 15* 18* 16*     Basic Metabolic Panel: Recent Labs  Lab 06/18/22 1217 06/18/22 1523 06/19/22 0416 06/20/22 0400  NA 136  --  138 138  K 2.5*  --  3.4* 3.2*  CL 100  --  107 105   CO2 25  --  22 24  GLUCOSE 110*  --  134* 95  BUN 21*  --  22* 19  CREATININE 0.60  --  0.64 0.49  CALCIUM 8.0*  --  7.4* 7.6*  MG  --  0.8* 1.7 2.1     CBG: Recent Labs  Lab 06/18/22 1945 06/19/22 0317 06/19/22 0446 06/19/22 0723 06/19/22 1111  GLUCAP 122* 126* 123* 120* 112*     Recent Results (from the past 240 hour(s))  Blood Culture (routine x 2)     Status: None (Preliminary result)   Collection Time: 06/18/22 12:17 PM   Specimen: BLOOD RIGHT HAND  Result Value Ref Range Status   Specimen Description BLOOD RIGHT HAND  Final   Special Requests   Final    BOTTLES DRAWN AEROBIC AND ANAEROBIC Blood Culture adequate volume   Culture   Final    NO GROWTH 2 DAYS Performed at [facility], [address] [place], [place] [address]    Report Status PENDING  Incomplete  Blood Culture (routine x 2)     Status: None (Preliminary result)   Collection Time: 06/18/22 12:17 PM   Specimen: Left Antecubital; Blood  Result Value Ref Range Status   Specimen Description LEFT ANTECUBITAL  Final   Special Requests   Final    BOTTLES DRAWN AEROBIC AND ANAEROBIC Blood Culture adequate volume   Culture   Final    NO GROWTH 2 DAYS Performed at [facility], [name] [place], [place] [address]    Report Status PENDING  Incomplete  Resp Panel by RT-PCR (Flu A&B, Covid) Anterior Nasal Swab     Status: None   Collection Time: 06/18/22  1:18 PM   Specimen: Anterior Nasal Swab  Result Value Ref Range Status   SARS Coronavirus 2 by RT PCR NEGATIVE NEGATIVE Final    Comment: (NOTE) SARS-CoV-2 target nucleic acids are NOT DETECTED.  The SARS-CoV-2 RNA is generally detectable in upper respiratory specimens during the acute phase of infection. The lowest concentration of SARS-CoV-2 viral copies this assay can detect is 138 copies/mL. A negative result does not preclude SARS-Cov-2 infection and should not be used as the sole basis for treatment or other patient management decisions.  A negative result may occur with  improper specimen collection/handling, submission of specimen other than nasopharyngeal swab, presence of viral mutation(s) within the areas targeted by this assay, and inadequate number of viral copies(<138 copies/mL). A negative result must be combined with clinical observations, patient history, and epidemiological information. The expected result is Negative.  Fact Sheet for Patients:  EntrepreneurPulse.com.au  Fact Sheet for Healthcare Providers:  IncredibleEmployment.be  This test is no t yet approved or cleared by the [place] FDA and  has been authorized for detection and/or diagnosis of SARS-CoV-2 by FDA under an Emergency Use Authorization (EUA). This EUA will remain  in effect (meaning this test can be used) for the duration of the COVID-19 declaration under Section 564(b)(1) of the Act, 21 U.S.C.section 360bbb-3(b)(1), unless the authorization is terminated  or revoked sooner.       Influenza A by PCR NEGATIVE NEGATIVE Final   Influenza B by PCR NEGATIVE NEGATIVE Final    Comment: (NOTE) The Xpert Xpress SARS-CoV-2/FLU/RSV plus assay is intended as an aid in the diagnosis of influenza from Nasopharyngeal swab specimens and should not be used as a sole basis for treatment. Nasal washings and aspirates are unacceptable for Xpert Xpress SARS-CoV-2/FLU/RSV testing.  Fact Sheet for Patients: EntrepreneurPulse.com.au  Fact Sheet for Healthcare Providers: IncredibleEmployment.be  This test is not yet approved or cleared by the [place] FDA and has been authorized for detection and/or diagnosis of SARS-CoV-2 by FDA under an Emergency Use Authorization (EUA). This EUA will remain in effect (meaning this test can be used) for the duration of the COVID-19 declaration under Section 564(b)(1) of the Act, 21 U.S.C. section 360bbb-3(b)(1), unless the authorization  is terminated or revoked.  Performed at [facility], [address] [place], [place] [address]   Urine Culture     Status: None   Collection Time: 06/18/22  1:42 PM   Specimen: In/Out Cath Urine  Result Value Ref Range Status   Specimen Description   Final    IN/OUT CATH URINE Performed at [facility], [address], [place], [place] [address]    Special Requests   Final    NONE Performed at [facility], [address], , [place] [address]    Culture   Final    NO GROWTH Performed at [facility], [place] [address], [place], [place] [address]    Report Status 06/19/2022 FINAL  Final  MRSA Next Gen by PCR, Nasal     Status: None   Collection Time: 06/18/22  5:29 PM   Specimen: Nasal Mucosa; Nasal Swab  Result Value Ref Range Status   MRSA by PCR Next Gen NOT DETECTED NOT DETECTED Final    Comment: (NOTE) The GeneXpert MRSA Assay (FDA approved for NASAL specimens only), is one component of a comprehensive MRSA colonization surveillance program. It is not intended to diagnose MRSA infection nor to guide or monitor treatment for MRSA infections. Test performance is not FDA approved in patients less than 77 years old. Performed at [facility], [address], [place], [place] [address]      Radiology Studies: EEG adult  Result Date: 07-20-22 Derek Jack, MD     07-20-22  7:03 AM Routine EEG Report JAKIYA BOOKBINDER is a 56 y.o. female with a history of seizure who is undergoing an EEG to evaluate for seizures. Report: This EEG was acquired with electrodes placed according to the International 10-20 electrode system (including Fp1, Fp2, F3, F4, C3, C4, P3, P4, O1, O2, T3, T4, T5, T6, A1, A2, Fz, Cz, Pz). The following electrodes were missing or displaced: none. The best background was 6 Hz with superimposed L focal slowing. This activity is reactive to stimulation. Drowsiness was manifested by background fragmentation; deeper stages of sleep were not identified.  There were continuous L LPDs, at times 0.5-1 Hz, and at times accelerating to 2-3 Hz with subtle evolution concerning for possible seizure (2-3 times in this routine recording). Photic stimulation and hyperventilation were not performed. Impression and clinical correlation: This EEG was obtained while awake and drowsy and is abnormal due to: - moderate diffuse slowing indicative of global cerebral dysfunction - Continuous L lateralized periodic discharges, at times 0.5-1 Hz, and at times accelerating to 2-3 Hz with subtle evolution concerning for possible seizure. This  pattern is on the ictal-interictal continuum with high risk for seizures. Prolonged EEG for further characterization is recommended. Su Monks, MD [facility] [phone] If 7pm- 7am, please page neurology on call as listed in [place].   ECHOCARDIOGRAM COMPLETE BUBBLE STUDY  Result Date: 06/19/2022    ECHOCARDIOGRAM REPORT   Patient Name:   ZEKIAH COEN Date of Exam: 06/19/2022 Medical Rec #:  [id number]        Height:       64.0 in Accession #:    [id number]       Weight:       241.2 lb Date of Birth:  [birth year]        BSA:          2.118 m Patient Age:    50 years         BP:           161/92 mmHg Patient Gender: F                HR:           86 bpm. Exam Location:  [facility] Procedure: 2D Echo, Cardiac Doppler, Color Doppler and Saline Contrast Bubble            Study Indications:    Stroke  History:        Patient has prior history of Echocardiogram examinations, most                 recent 01/16/2022. Stroke; Risk Factors:Hypertension, Diabetes,                 Dyslipidemia and Former Smoker. Multiple myleoma, Port-a-cath in                 place.  Sonographer:    Wenda Low Referring Phys: [id number] JAN A MANSY  Sonographer Comments: Patient is obese. IMPRESSIONS  1. Left ventricular ejection fraction, by estimation, is 60 to 65%. The left ventricle has normal function. The left ventricle has no regional wall motion  abnormalities. There is moderate asymmetric left ventricular hypertrophy of the septal segment. Left ventricular diastolic parameters are consistent with Grade I diastolic dysfunction (impaired relaxation).  2. Right ventricular systolic function is normal. The right ventricular size is normal. There is normal pulmonary artery systolic pressure. The estimated right ventricular systolic pressure is 65.0 mmHg.  3. The mitral valve is grossly normal. Trivial mitral valve regurgitation.  4. The aortic valve is tricuspid. Aortic valve regurgitation is mild. Aortic regurgitation PHT measures 603 msec.  5. Aortic dilatation noted. There is mild dilatation of the ascending aorta, measuring 41 mm.  6. The inferior vena cava is normal in size with <50% respiratory variability, suggesting right atrial pressure of 8 mmHg.  7. Agitated saline contrast bubble study was negative, with no evidence of right to left interatrial shunt. Comparison(s): Prior images reviewed side by side. LVEF remains normal range at 60-65%. Mildly dilated ascending aorta with mild aortic regurgitation. FINDINGS  Left Ventricle: Left ventricular ejection fraction, by estimation, is 60 to 65%. The left ventricle has normal function. The left ventricle has no regional wall motion abnormalities. The left ventricular internal cavity size was normal in size. There is  moderate asymmetric left ventricular hypertrophy of the septal segment. Left ventricular diastolic parameters are consistent with Grade I diastolic dysfunction (impaired relaxation). Right Ventricle: The right ventricular size is normal. No increase in right ventricular wall thickness. Right ventricular systolic function is normal. There is normal pulmonary artery  systolic pressure. The tricuspid regurgitant velocity is 2.20 m/s, and  with an assumed right atrial pressure of 8 mmHg, the estimated right ventricular systolic pressure is 81.0 mmHg. Left Atrium: Left atrial size was normal in size.  Right Atrium: Right atrial size was normal in size. Pericardium: There is no evidence of pericardial effusion. Mitral Valve: The mitral valve is grossly normal. Trivial mitral valve regurgitation. MV peak gradient, 4.1 mmHg. The mean mitral valve gradient is 2.0 mmHg. Tricuspid Valve: The tricuspid valve is grossly normal. Tricuspid valve regurgitation is trivial. Aortic Valve: The aortic valve is tricuspid. Aortic valve regurgitation is mild. Aortic regurgitation PHT measures 603 msec. Aortic valve mean gradient measures 5.0 mmHg. Aortic valve peak gradient measures 12.4 mmHg. Aortic valve area, by VTI measures 2.95 cm. Pulmonic Valve: The pulmonic valve was grossly normal. Pulmonic valve regurgitation is trivial. Aorta: The aortic root is normal in size and structure and aortic dilatation noted. There is mild dilatation of the ascending aorta, measuring 41 mm. Venous: The inferior vena cava is normal in size with less than 50% respiratory variability, suggesting right atrial pressure of 8 mmHg. IAS/Shunts: No atrial level shunt detected by color flow Doppler. Agitated saline contrast was given intravenously to evaluate for intracardiac shunting. Agitated saline contrast bubble study was negative, with no evidence of any interatrial shunt.  LEFT VENTRICLE PLAX 2D LVIDd:         5.20 cm   Diastology LVIDs:         3.30 cm   LV e' medial:    5.22 cm/s LV PW:         1.30 cm   LV E/e' medial:  15.2 LV IVS:        1.50 cm   LV e' lateral:   7.62 cm/s LVOT diam:     2.10 cm   LV E/e' lateral: 10.4 LV SV:         93 LV SV Index:   44 LVOT Area:     3.46 cm  RIGHT VENTRICLE RV Basal diam:  2.70 cm RV Mid diam:    2.20 cm RV S prime:     14.80 cm/s LEFT ATRIUM             Index        RIGHT ATRIUM           Index LA diam:        4.30 cm 2.03 cm/m   RA Area:     13.20 cm LA Vol (A2C):   62.0 ml 29.27 ml/m  RA Volume:   24.40 ml  11.52 ml/m LA Vol (A4C):   46.4 ml 21.90 ml/m LA Biplane Vol: 54.2 ml 25.59 ml/m  AORTIC  VALVE                    PULMONIC VALVE AV Area (Vmax):    2.81 cm     PV Vmax:       1.29 m/s AV Area (Vmean):   3.00 cm     PV Peak grad:  6.7 mmHg AV Area (VTI):     2.95 cm AV Vmax:           176.00 cm/s AV Vmean:          95.700 cm/s AV VTI:            0.315 m AV Peak Grad:      12.4 mmHg AV Mean Grad:      5.0  mmHg LVOT Vmax:         143.00 cm/s LVOT Vmean:        83.000 cm/s LVOT VTI:          0.268 m LVOT/AV VTI ratio: 0.85 AI PHT:            603 msec  AORTA Ao Root diam: 3.70 cm Ao Asc diam:  4.10 cm MITRAL VALVE                TRICUSPID VALVE MV Area (PHT): 5.06 cm     TR Peak grad:   19.4 mmHg MV Area VTI:   3.14 cm     TR Vmax:        220.00 cm/s MV Peak grad:  4.1 mmHg MV Mean grad:  2.0 mmHg     SHUNTS MV Vmax:       1.01 m/s     Systemic VTI:  0.27 m MV Vmean:      69.0 cm/s    Systemic Diam: 2.10 cm MV Decel Time: 150 msec MV E velocity: 79.50 cm/s MV A velocity: 114.00 cm/s MV E/A ratio:  0.70 Rozann Lesches MD Electronically signed by Rozann Lesches MD Signature Date/Time: 06/19/2022/10:34:16 AM    Final    MR BRAIN W WO CONTRAST  Result Date: 06/18/2022 CLINICAL DATA:  Provided history: Neuro deficit, acute, stroke suspected. Mental status change, unknown cause. EXAM: MRI HEAD WITHOUT AND WITH CONTRAST MRA HEAD WITHOUT CONTRAST TECHNIQUE: Multiplanar, multi-echo pulse sequences of the brain and surrounding structures were acquired without and with intravenous contrast. Angiographic images of the Circle of Willis were acquired using MRA technique without intravenous contrast. CONTRAST:  106m GADAVIST GADOBUTROL 1 MMOL/ML IV SOLN COMPARISON:  Brain MRI 06/12/2022. CT angiogram head/neck 12/26/2019. FINDINGS: MRI HEAD FINDINGS Intermittently motion degraded examination, limiting evaluation. Most notably, the axial T2 FLAIR sequence is moderately motion degraded, the coronal T2 and T2 FLAIR sequences through the hippocampi are mild to moderately motion degraded and the sagittal T1 weighted  postcontrast sequence is moderately motion degraded. Brain: No age advanced or lobar predominant parenchymal atrophy. Redemonstrated moderate-sized chronic cortical/subcortical infarct within the left parietal lobe (posterior left MCA territory and left MCA/PCA watershed territory). Chronic hemosiderin deposition within portions of this infarct territory. New from the prior brain MRI of 06/12/2022, there is cortical diffusion-weighted signal abnormality along the margins of this chronic infarct (within the right parietal lobe, at the left temporoparietal junction and within the lateral left occipital lobe) (for instance as seen on series 9, images 17 and 23). Subtle diffusion weighted signal abnormality is also suspected within the posteromedial left thalamus (series 9, image 17). Findings are suspicious for seizure related changes, although acute infarcts are also a consideration. Redemonstrated tiny foci of diffusion-weighted signal abnormality and T2 FLAIR hyperintense signal abnormality measuring up to 3 mm within the anterior right corona radiata (series 11, image 17) and left occipital lobe white matter (series 9, image 16). These were present on the prior brain MRI of 06/12/2022, and likely reflect small subacute infarcts (right MCA territory and left PCA territory, respectively). New 2 mm acute white matter infarct immediately lateral to the right caudate nucleus (series 9, image 19). Background multifocal T2 FLAIR hyperintense signal abnormality within the cerebral white matter (mild) and within the pons (moderate). Unchanged small chronic infarcts within the bilateral cerebellar hemispheres. No evidence of an intracranial mass. No extra-axial fluid collection. No midline shift. No definite pathologic intracranial enhancement. Vascular: Maintained flow voids within  the proximal large arterial vessels. Skull and upper cervical spine: Heterogeneous bone marrow signal with numerous foci of osseous enhancement.  These findings are compatible with widespread multiple myeloma. Incompletely assessed cervical spondylosis. Sinuses/Orbits: No mass or acute finding within the imaged orbits. No significant paranasal sinus disease. Other: Trace fluid within the bilateral mastoid air cells. MRA HEAD FINDINGS Mildly motion degraded exam. Anterior circulation: The intracranial internal carotid arteries are patent. Mild atherosclerotic irregularity of both vessels. The M1 middle cerebral arteries are patent. Atherosclerotic irregularity of the M2 and more distal MCA vessels, bilaterally. No M2 proximal branch occlusion or high-grade proximal stenosis. The anterior cerebral arteries are patent. Atherosclerotic irregularity of both vessels. Most notably, there is an apparent moderate/severe stenosis within the proximal right ACA A2 segment, which is new from the prior CTA of 12/26/2019. Posterior circulation: The intracranial vertebral arteries are patent. The basilar artery is patent. Mild atherosclerotic irregularity of the basilar artery. The posterior cerebral arteries are patent. Atherosclerotic irregularity of both vessels. Most notably, there are sites of up to moderate stenosis within left PCA branches at P2/P3 junction, progressed (series 1033, image 2) and a progressive moderate stenosis within a right PCA branch at the P2/P3 junction. A left posterior communicating artery is present. The right posterior communicating artery is diminutive or absent Anatomic variants: As described. MRI brain impression #2 and #3 will be called to the ordering clinician or representative by the Radiologist Assistant, and communication documented in the PACS or [employer]. IMPRESSION: MRI brain: 1. Intermittently motion degraded exam. 2. Foci of cortical diffusion-weighted signal abnormality along the margins of a chronic left parietal lobe infarct (within the left parietal lobe, at the left temporoparietal junction and within the lateral  left occipital lobe). Subtle diffusion-weighted signal abnormality is also suspected within the posteromedial left thalamus. These findings are suspicious for seizure-related changes. However, alternatively, these may reflect acute infarcts. 3. New 2 mm acute white matter infarct immediately lateral to the right caudate head. 4. Redemonstrated tiny subacute infarcts within the anterior right corona radiata and left occipital lobe white matter (right MCA and left PCA vascular territories, respectively). 5. Background chronic small vessel ischemic changes, as described and unchanged from the recent prior brain MRI of 06/12/2022. 6. Redemonstrated small chronic infarcts within both cerebellar hemispheres. 7. Widespread multiple myeloma. MRA head: 1. No intracranial large vessel occlusion is identified. 2. Intracranial atherosclerotic disease with multifocal stenoses, most notably as follows. 3. Moderate/severe stenosis within the right ACA proximal A2 segment, new from the prior CTA head/neck of 12/26/2019. 4. Sites of up to moderate stenosis within left PCA branches at the P2/P3 junction, progressed. 5. Moderate stenosis within a right PCA branch at the P2/P3 junction, progressed. Electronically Signed   By: Kellie Simmering D.O.   On: 06/18/2022 19:39   MR ANGIO HEAD WO CONTRAST  Result Date: 06/18/2022 CLINICAL DATA:  Provided history: Neuro deficit, acute, stroke suspected. Mental status change, unknown cause. EXAM: MRI HEAD WITHOUT AND WITH CONTRAST MRA HEAD WITHOUT CONTRAST TECHNIQUE: Multiplanar, multi-echo pulse sequences of the brain and surrounding structures were acquired without and with intravenous contrast. Angiographic images of the Circle of Willis were acquired using MRA technique without intravenous contrast. CONTRAST:  65m GADAVIST GADOBUTROL 1 MMOL/ML IV SOLN COMPARISON:  Brain MRI 06/12/2022. CT angiogram head/neck 12/26/2019. FINDINGS: MRI HEAD FINDINGS Intermittently motion degraded  examination, limiting evaluation. Most notably, the axial T2 FLAIR sequence is moderately motion degraded, the coronal T2 and T2 FLAIR sequences through the hippocampi are  mild to moderately motion degraded and the sagittal T1 weighted postcontrast sequence is moderately motion degraded. Brain: No age advanced or lobar predominant parenchymal atrophy. Redemonstrated moderate-sized chronic cortical/subcortical infarct within the left parietal lobe (posterior left MCA territory and left MCA/PCA watershed territory). Chronic hemosiderin deposition within portions of this infarct territory. New from the prior brain MRI of 06/12/2022, there is cortical diffusion-weighted signal abnormality along the margins of this chronic infarct (within the right parietal lobe, at the left temporoparietal junction and within the lateral left occipital lobe) (for instance as seen on series 9, images 17 and 23). Subtle diffusion weighted signal abnormality is also suspected within the posteromedial left thalamus (series 9, image 17). Findings are suspicious for seizure related changes, although acute infarcts are also a consideration. Redemonstrated tiny foci of diffusion-weighted signal abnormality and T2 FLAIR hyperintense signal abnormality measuring up to 3 mm within the anterior right corona radiata (series 11, image 17) and left occipital lobe white matter (series 9, image 16). These were present on the prior brain MRI of 06/12/2022, and likely reflect small subacute infarcts (right MCA territory and left PCA territory, respectively). New 2 mm acute white matter infarct immediately lateral to the right caudate nucleus (series 9, image 19). Background multifocal T2 FLAIR hyperintense signal abnormality within the cerebral white matter (mild) and within the pons (moderate). Unchanged small chronic infarcts within the bilateral cerebellar hemispheres. No evidence of an intracranial mass. No extra-axial fluid collection. No midline  shift. No definite pathologic intracranial enhancement. Vascular: Maintained flow voids within the proximal large arterial vessels. Skull and upper cervical spine: Heterogeneous bone marrow signal with numerous foci of osseous enhancement. These findings are compatible with widespread multiple myeloma. Incompletely assessed cervical spondylosis. Sinuses/Orbits: No mass or acute finding within the imaged orbits. No significant paranasal sinus disease. Other: Trace fluid within the bilateral mastoid air cells. MRA HEAD FINDINGS Mildly motion degraded exam. Anterior circulation: The intracranial internal carotid arteries are patent. Mild atherosclerotic irregularity of both vessels. The M1 middle cerebral arteries are patent. Atherosclerotic irregularity of the M2 and more distal MCA vessels, bilaterally. No M2 proximal branch occlusion or high-grade proximal stenosis. The anterior cerebral arteries are patent. Atherosclerotic irregularity of both vessels. Most notably, there is an apparent moderate/severe stenosis within the proximal right ACA A2 segment, which is new from the prior CTA of 12/26/2019. Posterior circulation: The intracranial vertebral arteries are patent. The basilar artery is patent. Mild atherosclerotic irregularity of the basilar artery. The posterior cerebral arteries are patent. Atherosclerotic irregularity of both vessels. Most notably, there are sites of up to moderate stenosis within left PCA branches at P2/P3 junction, progressed (series 1033, image 2) and a progressive moderate stenosis within a right PCA branch at the P2/P3 junction. A left posterior communicating artery is present. The right posterior communicating artery is diminutive or absent Anatomic variants: As described. MRI brain impression #2 and #3 will be called to the ordering clinician or representative by the Radiologist Assistant, and communication documented in the PACS or [employer]. IMPRESSION: MRI brain: 1.  Intermittently motion degraded exam. 2. Foci of cortical diffusion-weighted signal abnormality along the margins of a chronic left parietal lobe infarct (within the left parietal lobe, at the left temporoparietal junction and within the lateral left occipital lobe). Subtle diffusion-weighted signal abnormality is also suspected within the posteromedial left thalamus. These findings are suspicious for seizure-related changes. However, alternatively, these may reflect acute infarcts. 3. New 2 mm acute white matter infarct immediately lateral to the  right caudate head. 4. Redemonstrated tiny subacute infarcts within the anterior right corona radiata and left occipital lobe white matter (right MCA and left PCA vascular territories, respectively). 5. Background chronic small vessel ischemic changes, as described and unchanged from the recent prior brain MRI of 06/12/2022. 6. Redemonstrated small chronic infarcts within both cerebellar hemispheres. 7. Widespread multiple myeloma. MRA head: 1. No intracranial large vessel occlusion is identified. 2. Intracranial atherosclerotic disease with multifocal stenoses, most notably as follows. 3. Moderate/severe stenosis within the right ACA proximal A2 segment, new from the prior CTA head/neck of 12/26/2019. 4. Sites of up to moderate stenosis within left PCA branches at the P2/P3 junction, progressed. 5. Moderate stenosis within a right PCA branch at the P2/P3 junction, progressed. Electronically Signed   By: Kellie Simmering D.O.   On: 06/18/2022 19:39   DG Chest Port 1 View  Result Date: 06/18/2022 CLINICAL DATA:  Possible sepsis EXAM: PORTABLE CHEST 1 VIEW COMPARISON:  06/08/22 CXR FINDINGS: Right chest part with the tip at the cavoatrial junction. Unchanged cardiac and mediastinal contours. No pleural effusion. No pneumothorax. Bibasilar atelectasis. Visualized upper abdomen is unremarkable.No displaced rib fractures. IMPRESSION: Bibasilar atelectasis. Electronically Signed    By: Marin Roberts M.D.   On: 06/18/2022 12:12    Scheduled Meds:  acyclovir  400 mg Oral BID   allopurinol  300 mg Oral Daily   aspirin  300 mg Rectal Daily   Chlorhexidine Gluconate Cloth  6 each Topical Daily   diclofenac Sodium  2 g Topical QID   docusate sodium  100 mg Oral Daily   escitalopram  10 mg Oral Daily   fluticasone  2 spray Each Nare Daily   pantoprazole  40 mg Oral Daily   phenytoin (DILANTIN) IV  100 mg Intravenous Q8H   pregabalin  200 mg Oral BID   rosuvastatin  20 mg Oral Daily   Continuous Infusions:  lactated ringers 70 mL/hr at 06/20/22 0656   levETIRAcetam Stopped (06/20/22 0253)     LOS: 2 days   Critical Care Procedure Note Authorized and Performed by: Murvin Natal MD  Total Critical Care time:  63 mins Due to a high probability of clinically significant, life threatening deterioration, the patient required my highest level of preparedness to intervene emergently and I personally spent this critical care time directly and personally managing the patient.  This critical care time included obtaining a history; examining the patient, pulse oximetry; ordering and review of studies; arranging urgent treatment with development of a management plan; evaluation of patient's response of treatment; frequent reassessment; and discussions with other providers.  This critical care time was performed to assess and manage the high probability of imminent and life threatening deterioration that could result in multi-organ failure.  It was exclusive of separately billable procedures and treating other patients and teaching time.    Little Ishikawa, DO How to contact the [facility] Attending or Consulting provider [place] or covering provider during after hours [place], for this patient?  Check the care team in [facility] and look for a) attending/consulting [facility] provider listed and b) the [facility] team listed Log into [email or web address] and use [place]'s universal password to access. If you do  not have the password, please contact the hospital operator. Locate the [facility] provider you are looking for under [facility] and page to a number that you can be directly reached. If you still have difficulty reaching the provider, please page the [facility] (Director on Call) for  the Hospitalists listed on amion for assistance.  06/20/2022, 7:36 AM

## 2022-06-20 NOTE — Progress Notes (Signed)
Subjective: Slightly better per family, appearing more awake and able to follow some commands, but still without usable speech output.   Objective: Current vital signs: BP (!) 148/84 (BP Location: Left Arm)   Pulse (!) 55   Temp 98.2 F (36.8 C) (Axillary)   Resp 16   Ht _0  (1.626 m)   Wt 109.4 kg   SpO2 100%   BMI 41.40 kg/m  Vital signs in last 24 hours: Temp:  [97.3 F (36.3 C)-98.7 F (37.1 C)] 98.2 F (36.8 C) (12/13 1646) Pulse Rate:  [55-79] 55 (12/13 1646) Resp:  [16-22] 16 (12/13 1646) BP: (125-151)/(84-96) 148/84 (12/13 1646) SpO2:  [99 %-100 %] 100 % (12/13 1646)  Intake/Output from previous day: 12/12 0701 - 12/13 0700 In: 1147 [I.V.:1047; IV Piggyback:100] Out: 1025 [Urine:1025] Intake/Output this shift: No intake/output data recorded. Nutritional status:  Diet Order             Diet NPO time specified  Diet effective now                  HEENT: [place]/AT. EEG leads in place Lungs: Respirations unlabored Ext: Warm and well-perfused  Neurologic Exam: Mental status/Cognition: Eyes open, will gaze at examiner and make eye contact, but not following commands. Only verbal output consists of soft murmuring that is unintelligible.  Cranial nerves:  Fixates and tracks examiner as he moves. No nystagmus. No ptosis. Eyes are conjugate. Mild facial asymmetry Motor: Moves all 4 limbs to noxious stimuli. Movements lower in amplitude on the right.  Sensation:  Patient withdrew to pain in all 4 extremities. Coordination/Complex Motor: Unable to assess Gait: Deferred  Lab Results: Results for orders placed or performed during the hospital encounter of 06/18/22 (from the past 48 hour(s))  Glucose, capillary     Status: Abnormal   Collection Time: 06/18/22  7:45 PM  Result Value Ref Range   Glucose-Capillary 122 (H) 70 - 99 mg/dL    Comment: Glucose reference range applies only to samples taken after fasting for at least 8 hours.  Glucose, capillary      Status: Abnormal   Collection Time: 06/19/22  3:17 AM  Result Value Ref Range   Glucose-Capillary 126 (H) 70 - 99 mg/dL    Comment: Glucose reference range applies only to samples taken after fasting for at least 8 hours.  Procalcitonin     Status: None   Collection Time: 06/19/22  4:16 AM  Result Value Ref Range   Procalcitonin 0.30 ng/mL    Comment:        Interpretation: PCT (Procalcitonin) <= 0.5 ng/mL: Systemic infection (sepsis) is not likely. Local bacterial infection is possible. (NOTE)       Sepsis PCT Algorithm           Lower Respiratory Tract                                      Infection PCT Algorithm    ----------------------------     ----------------------------         PCT < 0.25 ng/mL                PCT < 0.10 ng/mL          Strongly encourage             Strongly discourage   discontinuation of antibiotics    initiation of antibiotics    ----------------------------     -----------------------------  PCT 0.25 - 0.50 ng/mL            PCT 0.10 - 0.25 ng/mL               OR       >80% decrease in PCT            Discourage initiation of                                            antibiotics      Encourage discontinuation           of antibiotics    ----------------------------     -----------------------------         PCT >= 0.50 ng/mL              PCT 0.26 - 0.50 ng/mL               AND        <80% decrease in PCT             Encourage initiation of                                             antibiotics       Encourage continuation           of antibiotics    ----------------------------     -----------------------------        PCT >= 0.50 ng/mL                  PCT > 0.50 ng/mL               AND         increase in PCT                  Strongly encourage                                      initiation of antibiotics    Strongly encourage escalation           of antibiotics                                     -----------------------------                                            PCT <= 0.25 ng/mL                                                 OR                                        > 80% decrease in PCT  Discontinue / Do not initiate                                             antibiotics  Performed at [facility], [address] [place], [place] [address]   Basic metabolic panel     Status: Abnormal   Collection Time: 06/19/22  4:16 AM  Result Value Ref Range   Sodium 138 135 - 145 mmol/L   Potassium 3.4 (L) 3.5 - 5.1 mmol/L    Comment: DELTA CHECK NOTED   Chloride 107 98 - 111 mmol/L   CO2 22 22 - 32 mmol/L   Glucose, Bld 134 (H) 70 - 99 mg/dL    Comment: Glucose reference range applies only to samples taken after fasting for at least 8 hours.   BUN 22 (H) 6 - 20 mg/dL   Creatinine, Ser 0.64 0.44 - 1.00 mg/dL   Calcium 7.4 (L) 8.9 - 10.3 mg/dL   GFR, Estimated >60 >60 mL/min    Comment: (NOTE) Calculated using the CKD-EPI Creatinine Equation (2021)    Anion gap 9 5 - 15    Comment: Performed at [facility], [address] [place], [place] [address]  Magnesium     Status: None   Collection Time: 06/19/22  4:16 AM  Result Value Ref Range   Magnesium 1.7 1.7 - 2.4 mg/dL    Comment: Performed at [facility], [address] [place], [place] [address]  CBC     Status: Abnormal   Collection Time: 06/19/22  4:16 AM  Result Value Ref Range   WBC 3.6 (L) 4.0 - 10.5 K/uL   RBC 2.80 (L) 3.87 - 5.11 MIL/uL   Hemoglobin 8.4 (L) 12.0 - 15.0 g/dL   HCT 25.5 (L) 36.0 - 46.0 %   MCV 91.1 80.0 - 100.0 fL   MCH 30.0 26.0 - 34.0 pg   MCHC 32.9 30.0 - 36.0 g/dL   RDW 17.9 (H) 11.5 - 15.5 %   Platelets 18 (LL) 150 - 400 K/uL    Comment: SPECIMEN CHECKED FOR CLOTS Immature Platelet Fraction may be clinically indicated, consider ordering this additional test [id number] CRITICAL RESULT CALLED TO, READ BACK BY AND VERIFIED WITH: MEZA,R_0  S.LANE 12.12.2023 MATTHEB    nRBC 0.8 (H) 0.0 - 0.2 %     Comment: Performed at [facility], [address] [place], [place] [address]  Lipid panel     Status: Abnormal   Collection Time: 06/19/22  4:16 AM  Result Value Ref Range   Cholesterol 39 0 - 200 mg/dL   Triglycerides 61 <150 mg/dL   HDL 17 (L) >40 mg/dL   Total CHOL/HDL Ratio 2.3 RATIO   VLDL 12 0 - 40 mg/dL   LDL Cholesterol 10 0 - 99 mg/dL    Comment:        Total Cholesterol/HDL:CHD Risk Coronary Heart Disease Risk Table                     Men   Women  1/2 Average Risk   3.4   3.3  Average Risk       5.0   4.4  2 X Average Risk   9.6   7.1  3 X Average Risk  23.4   11.0        Use the calculated Patient Ratio above and the CHD Risk  Table to determine the patient's CHD Risk.        ATP III CLASSIFICATION (LDL):  <100     mg/dL   Optimal  100-129  mg/dL   Near or Above                    Optimal  130-159  mg/dL   Borderline  160-189  mg/dL   High  >190     mg/dL   Very High Performed at [facility] [place], [place] [address]   Hemoglobin A1c     Status: Abnormal   Collection Time: 06/19/22  4:16 AM  Result Value Ref Range   Hgb A1c MFr Bld 7.2 (H) 4.8 - 5.6 %    Comment: (NOTE)         Prediabetes: 5.7 - 6.4         Diabetes: >6.4         Glycemic control for adults with diabetes: <7.0    Mean Plasma Glucose 160 mg/dL    Comment: (NOTE) Performed At: [facility] [address] [place], [place] [id number] Rush Farmer MD WL:[id number]   Glucose, capillary     Status: Abnormal   Collection Time: 06/19/22  4:46 AM  Result Value Ref Range   Glucose-Capillary 123 (H) 70 - 99 mg/dL    Comment: Glucose reference range applies only to samples taken after fasting for at least 8 hours.   Comment 1 Notify RN    Comment 2 Document in Chart   Glucose, capillary     Status: Abnormal   Collection Time: 06/19/22  7:23 AM  Result Value Ref Range   Glucose-Capillary 120 (H) 70 - 99 mg/dL    Comment: Glucose reference range applies only to  samples taken after fasting for at least 8 hours.  Glucose, capillary     Status: Abnormal   Collection Time: 06/19/22 11:11 AM  Result Value Ref Range   Glucose-Capillary 112 (H) 70 - 99 mg/dL    Comment: Glucose reference range applies only to samples taken after fasting for at least 8 hours.  Basic metabolic panel     Status: Abnormal   Collection Time: 06/20/22  4:00 AM  Result Value Ref Range   Sodium 138 135 - 145 mmol/L   Potassium 3.2 (L) 3.5 - 5.1 mmol/L   Chloride 105 98 - 111 mmol/L   CO2 24 22 - 32 mmol/L   Glucose, Bld 95 70 - 99 mg/dL    Comment: Glucose reference range applies only to samples taken after fasting for at least 8 hours.   BUN 19 6 - 20 mg/dL   Creatinine, Ser 0.49 0.44 - 1.00 mg/dL   Calcium 7.6 (L) 8.9 - 10.3 mg/dL   GFR, Estimated >60 >60 mL/min    Comment: (NOTE) Calculated using the CKD-EPI Creatinine Equation (2021)    Anion gap 9 5 - 15    Comment: Performed at [facility] [address] [place], [place] [address]  CBC     Status: Abnormal   Collection Time: 06/20/22  4:00 AM  Result Value Ref Range   WBC 2.6 (L) 4.0 - 10.5 K/uL   RBC 2.44 (L) 3.87 - 5.11 MIL/uL   Hemoglobin 7.3 (L) 12.0 - 15.0 g/dL   HCT 21.4 (L) 36.0 - 46.0 %   MCV 87.7 80.0 - 100.0 fL   MCH 29.9 26.0 - 34.0 pg   MCHC 34.1 30.0 - 36.0 g/dL  RDW 18.4 (H) 11.5 - 15.5 %   Platelets 16 (LL) 150 - 400 K/uL    Comment: SPECIMEN CHECKED FOR CLOTS REPEATED TO VERIFY PLATELET COUNT CONFIRMED BY SMEAR THIS CRITICAL RESULT HAS VERIFIED AND BEEN CALLED TO B. COLE, RN BY HAYLEE HOWARD ON 12 13 2023 AT 0533, AND HAS BEEN READ BACK.     nRBC 0.8 (H) 0.0 - 0.2 %    Comment: Performed at [facility], [place] [address] [place], [place] [address]  Magnesium     Status: None   Collection Time: 06/20/22  4:00 AM  Result Value Ref Range   Magnesium 2.1 1.7 - 2.4 mg/dL    Comment: Performed at [facility] [address] [place], [place] [address]   *Note: Due to a  large number of results and/or encounters for the requested time period, some results have not been displayed. A complete set of results can be found in Results Review.    Recent Results (from the past 240 hour(s))  Blood Culture (routine x 2)     Status: None (Preliminary result)   Collection Time: 06/18/22 12:17 PM   Specimen: BLOOD RIGHT HAND  Result Value Ref Range Status   Specimen Description BLOOD RIGHT HAND  Final   Special Requests   Final    BOTTLES DRAWN AEROBIC AND ANAEROBIC Blood Culture adequate volume   Culture   Final    NO GROWTH 2 DAYS Performed at [facility], [address], [place], [place] [address]    Report Status PENDING  Incomplete  Blood Culture (routine x 2)     Status: None (Preliminary result)   Collection Time: 06/18/22 12:17 PM   Specimen: Left Antecubital; Blood  Result Value Ref Range Status   Specimen Description LEFT ANTECUBITAL  Final   Special Requests   Final    BOTTLES DRAWN AEROBIC AND ANAEROBIC Blood Culture adequate volume   Culture   Final    NO GROWTH 2 DAYS Performed at [facility], [address], [place], [place] [address]    Report Status PENDING  Incomplete  Resp Panel by RT-PCR (Flu A&B, Covid) Anterior Nasal Swab     Status: None   Collection Time: 06/18/22  1:18 PM   Specimen: Anterior Nasal Swab  Result Value Ref Range Status   SARS Coronavirus 2 by RT PCR NEGATIVE NEGATIVE Final    Comment: (NOTE) SARS-CoV-2 target nucleic acids are NOT DETECTED.  The SARS-CoV-2 RNA is generally detectable in upper respiratory specimens during the acute phase of infection. The lowest concentration of SARS-CoV-2 viral copies this assay can detect is 138 copies/mL. A negative result does not preclude SARS-Cov-2 infection and should not be used as the sole basis for treatment or other patient management decisions. A negative result may occur with  improper specimen collection/handling, submission of specimen other than nasopharyngeal swab,  presence of viral mutation(s) within the areas targeted by this assay, and inadequate number of viral copies(<138 copies/mL). A negative result must be combined with clinical observations, patient history, and epidemiological information. The expected result is Negative.  Fact Sheet for Patients:  EntrepreneurPulse.com.au  Fact Sheet for Healthcare Providers:  IncredibleEmployment.be  This test is no t yet approved or cleared by the [place] FDA and  has been authorized for detection and/or diagnosis of SARS-CoV-2 by FDA under an Emergency Use Authorization (EUA). This EUA will remain  in effect (meaning this test can be used) for the duration of the COVID-19  declaration under Section 564(b)(1) of the Act, 21 U.S.C.section 360bbb-3(b)(1), unless the authorization is terminated  or revoked sooner.       Influenza A by PCR NEGATIVE NEGATIVE Final   Influenza B by PCR NEGATIVE NEGATIVE Final    Comment: (NOTE) The Xpert Xpress SARS-CoV-2/FLU/RSV plus assay is intended as an aid in the diagnosis of influenza from Nasopharyngeal swab specimens and should not be used as a sole basis for treatment. Nasal washings and aspirates are unacceptable for Xpert Xpress SARS-CoV-2/FLU/RSV testing.  Fact Sheet for Patients: EntrepreneurPulse.com.au  Fact Sheet for Healthcare Providers: IncredibleEmployment.be  This test is not yet approved or cleared by the [place] FDA and has been authorized for detection and/or diagnosis of SARS-CoV-2 by FDA under an Emergency Use Authorization (EUA). This EUA will remain in effect (meaning this test can be used) for the duration of the COVID-19 declaration under Section 564(b)(1) of the Act, 21 U.S.C. section 360bbb-3(b)(1), unless the authorization is terminated or revoked.  Performed at [facility], [address] [place], [place] [address]   Urine Culture     Status:  None   Collection Time: 06/18/22  1:42 PM   Specimen: In/Out Cath Urine  Result Value Ref Range Status   Specimen Description   Final    IN/OUT CATH URINE Performed at [facility], [address] [place], [place] [address]    Special Requests   Final    NONE Performed at [facility], [address], [place], [place] [address]    Culture   Final    NO GROWTH Performed at [facility], [place] [address], [place], [place] [address]    Report Status 06/19/2022 FINAL  Final  MRSA Next Gen by PCR, Nasal     Status: None   Collection Time: 06/18/22  5:29 PM   Specimen: Nasal Mucosa; Nasal Swab  Result Value Ref Range Status   MRSA by PCR Next Gen NOT DETECTED NOT DETECTED Final    Comment: (NOTE) The GeneXpert MRSA Assay (FDA approved for NASAL specimens only), is one component of a comprehensive MRSA colonization surveillance program. It is not intended to diagnose MRSA infection nor to guide or monitor treatment for MRSA infections. Test performance is not FDA approved in patients less than 20 years old. Performed at [facility], [address], [place], [place] [address]     Lipid Panel Recent Labs    06/19/22 0416  CHOL 39  TRIG 61  HDL 17*  CHOLHDL 2.3  VLDL 12  LDLCALC 10    Studies/Results: EEG adult  Result Date: 06/20/2022 Derek Jack, MD     06/20/2022  7:03 AM Routine EEG Report LAJEANA STROUGH is a 56 y.o. female with a history of seizure who is undergoing an EEG to evaluate for seizures. Report: This EEG was acquired with electrodes placed according to the International 10-20 electrode system (including Fp1, Fp2, F3, F4, C3, C4, P3, P4, O1, O2, T3, T4, T5, T6, A1, A2, Fz, Cz, Pz). The following electrodes were missing or displaced: none. The best background was 6 Hz with superimposed L focal slowing. This activity is reactive to stimulation. Drowsiness was manifested by background fragmentation; deeper stages of sleep were not identified. There were  continuous L LPDs, at times 0.5-1 Hz, and at times accelerating to 2-3 Hz with subtle evolution concerning for possible seizure (2-3 times in this routine recording). Photic stimulation and hyperventilation were not performed. Impression and clinical correlation: This EEG was  obtained while awake and drowsy and is abnormal due to: - moderate diffuse slowing indicative of global cerebral dysfunction - Continuous L lateralized periodic discharges, at times 0.5-1 Hz, and at times accelerating to 2-3 Hz with subtle evolution concerning for possible seizure. This pattern is on the ictal-interictal continuum with high risk for seizures. Prolonged EEG for further characterization is recommended. Su Monks, MD [facility] [phone] If 7pm- 7am, please page neurology on call as listed in [place].   ECHOCARDIOGRAM COMPLETE BUBBLE STUDY  Result Date: 06/19/2022    ECHOCARDIOGRAM REPORT   Patient Name:   Joanna Reid Date of Exam: 06/19/2022 Medical Rec #:  [id number]        Height:       64.0 in Accession #:    [id number]       Weight:       241.2 lb Date of Birth:  07-21-1965        BSA:          2.118 m Patient Age:    41 years         BP:           161/92 mmHg Patient Gender: F                HR:           86 bpm. Exam Location:  [facility] Procedure: 2D Echo, Cardiac Doppler, Color Doppler and Saline Contrast Bubble            Study Indications:    Stroke  History:        Patient has prior history of Echocardiogram examinations, most                 recent 01/16/2022. Stroke; Risk Factors:Hypertension, Diabetes,                 Dyslipidemia and Former Smoker. Multiple myleoma, Port-a-cath in                 place.  Sonographer:    Wenda Low Referring Phys: [id number] JAN A MANSY  Sonographer Comments: Patient is obese. IMPRESSIONS  1. Left ventricular ejection fraction, by estimation, is 60 to 65%. The left ventricle has normal function. The left ventricle has no regional wall motion  abnormalities. There is moderate asymmetric left ventricular hypertrophy of the septal segment. Left ventricular diastolic parameters are consistent with Grade I diastolic dysfunction (impaired relaxation).  2. Right ventricular systolic function is normal. The right ventricular size is normal. There is normal pulmonary artery systolic pressure. The estimated right ventricular systolic pressure is 09.6 mmHg.  3. The mitral valve is grossly normal. Trivial mitral valve regurgitation.  4. The aortic valve is tricuspid. Aortic valve regurgitation is mild. Aortic regurgitation PHT measures 603 msec.  5. Aortic dilatation noted. There is mild dilatation of the ascending aorta, measuring 41 mm.  6. The inferior vena cava is normal in size with <50% respiratory variability, suggesting right atrial pressure of 8 mmHg.  7. Agitated saline contrast bubble study was negative, with no evidence of right to left interatrial shunt. Comparison(s): Prior images reviewed side by side. LVEF remains normal range at 60-65%. Mildly dilated ascending aorta with mild aortic regurgitation. FINDINGS  Left Ventricle: Left ventricular ejection fraction, by estimation, is 60 to 65%. The left ventricle has normal function. The left ventricle has no regional wall motion abnormalities. The left ventricular internal cavity size was normal in size. There is  moderate asymmetric left ventricular  hypertrophy of the septal segment. Left ventricular diastolic parameters are consistent with Grade I diastolic dysfunction (impaired relaxation). Right Ventricle: The right ventricular size is normal. No increase in right ventricular wall thickness. Right ventricular systolic function is normal. There is normal pulmonary artery systolic pressure. The tricuspid regurgitant velocity is 2.20 m/s, and  with an assumed right atrial pressure of 8 mmHg, the estimated right ventricular systolic pressure is 02.6 mmHg. Left Atrium: Left atrial size was normal in size.  Right Atrium: Right atrial size was normal in size. Pericardium: There is no evidence of pericardial effusion. Mitral Valve: The mitral valve is grossly normal. Trivial mitral valve regurgitation. MV peak gradient, 4.1 mmHg. The mean mitral valve gradient is 2.0 mmHg. Tricuspid Valve: The tricuspid valve is grossly normal. Tricuspid valve regurgitation is trivial. Aortic Valve: The aortic valve is tricuspid. Aortic valve regurgitation is mild. Aortic regurgitation PHT measures 603 msec. Aortic valve mean gradient measures 5.0 mmHg. Aortic valve peak gradient measures 12.4 mmHg. Aortic valve area, by VTI measures 2.95 cm. Pulmonic Valve: The pulmonic valve was grossly normal. Pulmonic valve regurgitation is trivial. Aorta: The aortic root is normal in size and structure and aortic dilatation noted. There is mild dilatation of the ascending aorta, measuring 41 mm. Venous: The inferior vena cava is normal in size with less than 50% respiratory variability, suggesting right atrial pressure of 8 mmHg. IAS/Shunts: No atrial level shunt detected by color flow Doppler. Agitated saline contrast was given intravenously to evaluate for intracardiac shunting. Agitated saline contrast bubble study was negative, with no evidence of any interatrial shunt.  LEFT VENTRICLE PLAX 2D LVIDd:         5.20 cm   Diastology LVIDs:         3.30 cm   LV e' medial:    5.22 cm/s LV PW:         1.30 cm   LV E/e' medial:  15.2 LV IVS:        1.50 cm   LV e' lateral:   7.62 cm/s LVOT diam:     2.10 cm   LV E/e' lateral: 10.4 LV SV:         93 LV SV Index:   44 LVOT Area:     3.46 cm  RIGHT VENTRICLE RV Basal diam:  2.70 cm RV Mid diam:    2.20 cm RV S prime:     14.80 cm/s LEFT ATRIUM             Index        RIGHT ATRIUM           Index LA diam:        4.30 cm 2.03 cm/m   RA Area:     13.20 cm LA Vol (A2C):   62.0 ml 29.27 ml/m  RA Volume:   24.40 ml  11.52 ml/m LA Vol (A4C):   46.4 ml 21.90 ml/m LA Biplane Vol: 54.2 ml 25.59 ml/m  AORTIC  VALVE                    PULMONIC VALVE AV Area (Vmax):    2.81 cm     PV Vmax:       1.29 m/s AV Area (Vmean):   3.00 cm     PV Peak grad:  6.7 mmHg AV Area (VTI):     2.95 cm AV Vmax:           176.00 cm/s AV Vmean:  95.700 cm/s AV VTI:            0.315 m AV Peak Grad:      12.4 mmHg AV Mean Grad:      5.0 mmHg LVOT Vmax:         143.00 cm/s LVOT Vmean:        83.000 cm/s LVOT VTI:          0.268 m LVOT/AV VTI ratio: 0.85 AI PHT:            603 msec  AORTA Ao Root diam: 3.70 cm Ao Asc diam:  4.10 cm MITRAL VALVE                TRICUSPID VALVE MV Area (PHT): 5.06 cm     TR Peak grad:   19.4 mmHg MV Area VTI:   3.14 cm     TR Vmax:        220.00 cm/s MV Peak grad:  4.1 mmHg MV Mean grad:  2.0 mmHg     SHUNTS MV Vmax:       1.01 m/s     Systemic VTI:  0.27 m MV Vmean:      69.0 cm/s    Systemic Diam: 2.10 cm MV Decel Time: 150 msec MV E velocity: 79.50 cm/s MV A velocity: 114.00 cm/s MV E/A ratio:  0.70 Rozann Lesches MD Electronically signed by Rozann Lesches MD Signature Date/Time: 06/19/2022/10:34:16 AM    Final     Medications: Scheduled:  acyclovir  400 mg Oral BID   allopurinol  300 mg Oral Daily   aspirin  300 mg Rectal Daily   Chlorhexidine Gluconate Cloth  6 each Topical Daily   diclofenac Sodium  2 g Topical QID   docusate sodium  100 mg Oral Daily   escitalopram  10 mg Oral Daily   fluticasone  2 spray Each Nare Daily   pantoprazole  40 mg Oral Daily   phenytoin (DILANTIN) IV  100 mg Intravenous Q8H   pregabalin  200 mg Oral BID   rosuvastatin  20 mg Oral Daily   Continuous:  lactated ringers 70 mL/hr at 06/20/22 0656   levETIRAcetam 1,500 mg (06/20/22 1353)   potassium chloride      Assessment: Joanna Reid is a 56 y.o. 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 Witnessed tonic-clonic seizure occurred at [facility]. MRI showed old left parietal lobe infarct and adjacent cortical DWI signal most consistent with cortical  edema from prolonged seizure activity. She was transferred to [facility] for LTM EEG. Seizures seen on EEG upon hook-up and clinical seizure activity also witnessed by staff. No prior seizure history per family. EEG yesterday night was reviewed with LPDs on the left noted but with no sustained evolution to electrographic seizure - findings were on the ictal-interictal continuum. Lacosamide was added to Keppra at that time. Dilantin also added.  - Focal status epilepticus, now resolved based on LTM EEG tracings today, but clinically there is ongoing aphasia suspicious for possible subclinical seizure activity not detectable by EEG.  - Exam this evening at 6:50 PM reveals slight improvement in her level of alertness but still with dense receptive and expressive aphasia.  - EEG reports and interval reviews:  - 12:43 AM: Further review by Dr. Rory Percy and Dr. April Manson of EEG.  Continued to have LPD's 0.5 to 1 Hz with no sustained evolution to seizures. Continued Keppra Vimpat and Dilantin at that time. - EEG report  7:03 AM: This EEG was obtained while awake and drowsy and is abnormal due to  moderate diffuse slowing indicative of global cerebral dysfunction as well as continuous L lateralized periodic discharges, at times 0.5-1 Hz, and at times accelerating to 2-3 Hz with subtle evolution concerning for possible seizure. This pattern is on the ictal-interictal continuum with high risk for seizures.  - Review of LTM this evening at 7:00 PM: Continued LPDs. No evolution to electrographic seizures (discussed with Dr. Quinn Axe).  - Imaging: - MRI brain 12/11:  - Foci of cortical diffusion-weighted signal abnormality along the margins of a chronic left parietal lobe infarct (within the left parietal lobe, at the left temporoparietal junction and within the lateral left occipital lobe). Subtle diffusion-weighted signal abnormality is also suspected within the posteromedial left thalamus. These findings are suspicious for  seizure-related changes. On review of the images by Neurology, they appear unlikely to reflect acute infarcts.   - Also described on the official Radiology report are "New 2 mm acute white matter infarct immediately lateral to the right caudate head. Redemonstrated tiny subacute infarcts within the anterior right corona radiata and left occipital lobe white matter (right MCA and left PCA vascular territories, respectively)". On review by Neurology, these findings are faint and appear most consistent with incidental artifact.  - Background chronic small vessel ischemic changes on MRI are unchanged from the recent prior brain MRI of 06/12/2022. Redemonstrated small chronic infarcts within both cerebellar hemispheres. Widespread multiple myeloma involving the bone.  - MRA head: No intracranial large vessel occlusion is identified. Intracranial atherosclerotic disease with multifocal stenoses, most notably as follows. Moderate/severe stenosis within the right ACA proximal A2 segment, new from the prior CTA head/neck of 12/26/2019. Sites of up to moderate stenosis within left PCA branches at the P2/P3 junction, progressed. Moderate stenosis within a right PCA branch at the P2/P3 junction, progressed. - Labs: - Hypokalemia 2.5->3.4 -> 3.2 - Hypomagensia 0.8 ->1.7 -> 2.1 - Normal BUN and Cr at this time with eGFR > 60 - Leukopenia, anemia and thormbocytopenia on CBC - TSH normal   Recommendations: Continue LTM EEG Continue Keppra, Dilantin and Vimpat Adding phenobarbital 65 mg IV BID. Discussed this as an alternative to intubation with burst suppression. Family states that they would prefer this more conservative approach for now. Risks/benefits of both approaches discussed with family. All questions answered.  Continue ASA May need to be started on burst suppression protocol if phenobarbital does not result in resolution of clinical or electrographic focal status epilepticus.  Discussed current plan with  her husband over the telephone at 7:23 PM (he can be reached at [phone])  70 minutes spent in the neurological evaluation of this patient today, including examination, EEG review, imaging review and discussion of management options with several family members. Family discussion encompassed > 50% of the total time spent.    LOS: 2 days   _0  signed: Dr. Kerney Elbe 06/20/2022  6:35 PM

## 2022-06-20 NOTE — Progress Notes (Signed)
Further review with Dr. April Manson.  Continues to have LPD's 0.5 to 1 Hz with no sustained evolution to seizures. Continue Keppra Vimpat and Dilantin.  Continue LTM. Neurology will follow -- Amie Portland, MD Neurologist [facility] Pager: [phone]

## 2022-06-20 NOTE — Plan of Care (Signed)

## 2022-06-21 ENCOUNTER — Other Ambulatory Visit: Payer: BC Managed Care – PPO

## 2022-06-21 ENCOUNTER — Ambulatory Visit: Payer: BC Managed Care – PPO | Admitting: Hematology

## 2022-06-21 LAB — BASIC METABOLIC PANEL
Anion gap: 8 (ref 5–15)
BUN: 16 mg/dL (ref 6–20)
CO2: 24 mmol/L (ref 22–32)
Calcium: 7.7 mg/dL — ABNORMAL LOW (ref 8.9–10.3)
Chloride: 107 mmol/L (ref 98–111)
Creatinine, Ser: 0.58 mg/dL (ref 0.44–1.00)
GFR, Estimated: >60 mL/min (ref >60)
Glucose, Bld: 77 mg/dL (ref 70–99)
Potassium: 4.1 mmol/L (ref 3.5–5.1)
Sodium: 139 mmol/L (ref 135–145)

## 2022-06-21 LAB — CBC
HCT: 21.7 % — ABNORMAL LOW (ref 36.0–46.0)
Hemoglobin: 7.3 g/dL — ABNORMAL LOW (ref 12.0–15.0)
MCH: 30.2 pg (ref 26.0–34.0)
MCHC: 33.6 g/dL (ref 30.0–36.0)
MCV: 89.7 fL (ref 80.0–100.0)
Platelets: 20 10*3/uL — CL (ref 150–400)
RBC: 2.42 MIL/uL — ABNORMAL LOW (ref 3.87–5.11)
RDW: 18.5 % — ABNORMAL HIGH (ref 11.5–15.5)
WBC: 2.8 10*3/uL — ABNORMAL LOW (ref 4.0–10.5)
nRBC: 0.7 % — ABNORMAL HIGH (ref 0.0–0.2)

## 2022-06-21 NOTE — Progress Notes (Signed)
PROGRESS NOTE   Joanna Reid  SEG:[id number] DOB: [birth year] DOA: 06/18/2022 PCP: Lindell Spar, MD   Chief Complaint  Patient presents with   Weakness   Level of care: Progressive  Brief Admission History:  56 year old female with refractory multiple myeloma status post relapse, hypertension, gout, GERD, type 2 diabetes mellitus, migraine headaches, stroke with residual aphasia presented to ED for ongoing weakness and lethargy. Last had chemotherapy 2 weeks ago.  She has recently received PRBC and platelets in the cancer center.  While in the ED patient had acute mental status change, unarousable with full tonic-clonic seizure lasting 2 to 3 minutes which improved with lorazepam.  Transferred to [place] for further neuro evaluation and follow-up as well as continuous EEG.   Assessment and Plan:  New Onset Partial Status Epilepticus  Acute metabolic encephalopathy, minimally improving -Neurology following, EEG ongoing -Continue Keppra Dilantin and Vimpat, adding phenobarbital given ongoing mental status changes concerning for subclinical seizures.  Benzos as needed for breakthrough seizures.  Will follow-up with the next 24 hours, neurology has discussed with patient and family the possible need for intubation and ' burst suppression protocol'  Cerebrovascular Disease Acute CVA in right caudate  -Continue aspirin and statin -Neurology following as above -TTE mild grade 1 diastolic dysfunction, bubble study negative -A1c 7.2, TSH 2.0 - permissive hypertension x 48 hours  - SLP/PT/OT evaluation  Severe Hypomagnesemia Hypokalemia -Magnesium 0.8 at intake, repleted, repeat 2.1 -Potassium 3.2 add 40 mEq IV x 1  Refractory Multiple Myeloma  - treated by Dr. Delton Coombes - last chem given reportedly 2 weeks ago  Pancytopenia  -Secondary to above - s/p 1 unit PRBC recommended by Dr. Federico Flake - Hg up to 8.4 - Platelets 18>>16 -no signs or symptoms of bleeding,  hold off on further transfusion at this time - WBC 3.6   DVT prophylaxis: SCD only given above thrombocytopenia Code Status: full  Family Communication: Husband updated at bedside Disposition: Status is: Inpatient Remains inpatient appropriate given above ongoing need for evaluation, EEG IV medications   Consultants:  Neurology   Procedures:   Antimicrobials:    Subjective: Patient much more awake and alert this morning but unable to follow commands or interact in any meaningful way.  Husband updated at bedside  Objective: Vitals:   06/21/22 0200 06/21/22 0334 06/21/22 0400 06/21/22 0600  BP:  (!) 145/93    Pulse:  86    Resp: _0 Temp:  98.2 F (36.8 C)    TempSrc:  Oral    SpO2:  99%    Weight:      Height:        Intake/Output Summary (Last 24 hours) at 06/21/2022 0715 Last data filed at 06/21/2022 0600 Gross per 24 hour  Intake 2400.62 ml  Output 1000 ml  Net 1400.62 ml    Filed Weights   06/18/22 1128 06/18/22 1728 06/19/22 0500  Weight: 107 kg 109.3 kg 109.4 kg   Examination:  General exam: pt minimally interactive with family and staff but improved from yesterday; somnolent;  not following commands.  Respiratory system: Clear to auscultation. Respiratory effort normal. Cardiovascular system: normal S1 & S2 heard. No JVD, murmurs, rubs, gallops or clicks. 2+ pedal edema. Gastrointestinal system: Abdomen is nondistended, soft and nontender. No organomegaly or masses felt. Normal bowel sounds heard. Central nervous system: somnolent, not following commands, mild twitching in lower extremities seen; facial droop present;   Extremities: 1+ edema BLEs.  Skin:  No rashes, lesions or ulcers.  Data Reviewed: I have personally reviewed following labs and imaging studies  CBC: Recent Labs  Lab 06/18/22 1217 06/19/22 0416 06/20/22 0400  WBC 2.6* 3.6* 2.6*  NEUTROABS 1.3*  --   --   HGB 7.7* 8.4* 7.3*  HCT 22.5* 25.5* 21.4*  MCV 87.2 91.1 87.7   PLT 15* 18* 16*     Basic Metabolic Panel: Recent Labs  Lab 06/18/22 1217 06/18/22 1523 06/19/22 0416 06/20/22 0400  NA 136  --  138 138  K 2.5*  --  3.4* 3.2*  CL 100  --  107 105  CO2 25  --  22 24  GLUCOSE 110*  --  134* 95  BUN 21*  --  22* 19  CREATININE 0.60  --  0.64 0.49  CALCIUM 8.0*  --  7.4* 7.6*  MG  --  0.8* 1.7 2.1     CBG: Recent Labs  Lab 06/18/22 1945 06/19/22 0317 06/19/22 0446 06/19/22 0723 06/19/22 1111  GLUCAP 122* 126* 123* 120* 112*     Recent Results (from the past 240 hour(s))  Blood Culture (routine x 2)     Status: None (Preliminary result)   Collection Time: 06/18/22 12:17 PM   Specimen: BLOOD RIGHT HAND  Result Value Ref Range Status   Specimen Description BLOOD RIGHT HAND  Final   Special Requests   Final    BOTTLES DRAWN AEROBIC AND ANAEROBIC Blood Culture adequate volume   Culture   Final    NO GROWTH 3 DAYS Performed at [facility], [address] [place], [place] [address]    Report Status PENDING  Incomplete  Blood Culture (routine x 2)     Status: None (Preliminary result)   Collection Time: 06/18/22 12:17 PM   Specimen: Left Antecubital; Blood  Result Value Ref Range Status   Specimen Description LEFT ANTECUBITAL  Final   Special Requests   Final    BOTTLES DRAWN AEROBIC AND ANAEROBIC Blood Culture adequate volume   Culture   Final    NO GROWTH 3 DAYS Performed at [facility], [address], [place], [place] [address]    Report Status PENDING  Incomplete  Resp Panel by RT-PCR (Flu A&B, Covid) Anterior Nasal Swab     Status: None   Collection Time: 06/18/22  1:18 PM   Specimen: Anterior Nasal Swab  Result Value Ref Range Status   SARS Coronavirus 2 by RT PCR NEGATIVE NEGATIVE Final    Comment: (NOTE) SARS-CoV-2 target nucleic acids are NOT DETECTED.  The SARS-CoV-2 RNA is generally detectable in upper respiratory specimens during the acute phase of infection. The lowest concentration of SARS-CoV-2 viral  copies this assay can detect is 138 copies/mL. A negative result does not preclude SARS-Cov-2 infection and should not be used as the sole basis for treatment or other patient management decisions. A negative result may occur with  improper specimen collection/handling, submission of specimen other than nasopharyngeal swab, presence of viral mutation(s) within the areas targeted by this assay, and inadequate number of viral copies(<138 copies/mL). A negative result must be combined with clinical observations, patient history, and epidemiological information. The expected result is Negative.  Fact Sheet for Patients:  EntrepreneurPulse.com.au  Fact Sheet for Healthcare Providers:  IncredibleEmployment.be  This test is no t yet approved or cleared by the [place] FDA and  has been authorized for detection and/or diagnosis of SARS-CoV-2 by FDA under an Emergency Use Authorization (EUA). This EUA will remain  in effect (meaning this test can be used) for the duration of the COVID-19 declaration under Section 564(b)(1) of the Act, 21 U.S.C.section 360bbb-3(b)(1), unless the authorization is terminated  or revoked sooner.       Influenza A by PCR NEGATIVE NEGATIVE Final   Influenza B by PCR NEGATIVE NEGATIVE Final    Comment: (NOTE) The Xpert Xpress SARS-CoV-2/FLU/RSV plus assay is intended as an aid in the diagnosis of influenza from Nasopharyngeal swab specimens and should not be used as a sole basis for treatment. Nasal washings and aspirates are unacceptable for Xpert Xpress SARS-CoV-2/FLU/RSV testing.  Fact Sheet for Patients: EntrepreneurPulse.com.au  Fact Sheet for Healthcare Providers: IncredibleEmployment.be  This test is not yet approved or cleared by the [place] FDA and has been authorized for detection and/or diagnosis of SARS-CoV-2 by FDA under an Emergency Use Authorization (EUA). This  EUA will remain in effect (meaning this test can be used) for the duration of the COVID-19 declaration under Section 564(b)(1) of the Act, 21 U.S.C. section 360bbb-3(b)(1), unless the authorization is terminated or revoked.  Performed at [facility], [address] [place], [place] [address]   Urine Culture     Status: None   Collection Time: 06/18/22  1:42 PM   Specimen: In/Out Cath Urine  Result Value Ref Range Status   Specimen Description   Final    IN/OUT CATH URINE Performed at [facility], [address] [place], [place] [address]    Special Requests   Final    NONE Performed at [facility], [address] [place], [place] [address]    Culture   Final    NO GROWTH Performed at [facility], [address], [place], [place] [address]    Report Status 06/19/2022 FINAL  Final  MRSA Next Gen by PCR, Nasal     Status: None   Collection Time: 06/18/22  5:29 PM   Specimen: Nasal Mucosa; Nasal Swab  Result Value Ref Range Status   MRSA by PCR Next Gen NOT DETECTED NOT DETECTED Final    Comment: (NOTE) The GeneXpert MRSA Assay (FDA approved for NASAL specimens only), is one component of a comprehensive MRSA colonization surveillance program. It is not intended to diagnose MRSA infection nor to guide or monitor treatment for MRSA infections. Test performance is not FDA approved in patients less than 25 years old. Performed at [facility], [address], [place], [place] [address]      Radiology Studies: EEG adult  Result Date: Jul 02, 2022 Derek Jack, MD     02-Jul-2022  7:03 AM Routine EEG Report KALEEA PENNER is a 56 y.o. female with a history of seizure who is undergoing an EEG to evaluate for seizures. Report: This EEG was acquired with electrodes placed according to the International 10-20 electrode system (including Fp1, Fp2, F3, F4, C3, C4, P3, P4, O1, O2, T3, T4, T5, T6, A1, A2, Fz, Cz, Pz). The following electrodes were missing or displaced: none. The  best background was 6 Hz with superimposed L focal slowing. This activity is reactive to stimulation. Drowsiness was manifested by background fragmentation; deeper stages of sleep were not identified. There were continuous L LPDs, at times 0.5-1 Hz, and at times accelerating to 2-3 Hz with subtle evolution concerning for possible seizure (2-3 times in this routine recording). Photic stimulation and hyperventilation were not performed. Impression and clinical correlation: This EEG was obtained while awake and drowsy and is abnormal due to: - moderate diffuse slowing  indicative of global cerebral dysfunction - Continuous L lateralized periodic discharges, at times 0.5-1 Hz, and at times accelerating to 2-3 Hz with subtle evolution concerning for possible seizure. This pattern is on the ictal-interictal continuum with high risk for seizures. Prolonged EEG for further characterization is recommended. Su Monks, MD [facility] [phone] If 7pm- 7am, please page neurology on call as listed in [place].   ECHOCARDIOGRAM COMPLETE BUBBLE STUDY  Result Date: 06/19/2022    ECHOCARDIOGRAM REPORT   Patient Name:   MELESSA COWELL Date of Exam: 06/19/2022 Medical Rec #:  [id number]        Height:       64.0 in Accession #:    [id number]       Weight:       241.2 lb Date of Birth:  [birth year]        BSA:          2.118 m Patient Age:    9 years         BP:           161/92 mmHg Patient Gender: F                HR:           86 bpm. Exam Location:  [facility] Procedure: 2D Echo, Cardiac Doppler, Color Doppler and Saline Contrast Bubble            Study Indications:    Stroke  History:        Patient has prior history of Echocardiogram examinations, most                 recent 01/16/2022. Stroke; Risk Factors:Hypertension, Diabetes,                 Dyslipidemia and Former Smoker. Multiple myleoma, Port-a-cath in                 place.  Sonographer:    Wenda Low Referring Phys: [id number] JAN A MANSY   Sonographer Comments: Patient is obese. IMPRESSIONS  1. Left ventricular ejection fraction, by estimation, is 60 to 65%. The left ventricle has normal function. The left ventricle has no regional wall motion abnormalities. There is moderate asymmetric left ventricular hypertrophy of the septal segment. Left ventricular diastolic parameters are consistent with Grade I diastolic dysfunction (impaired relaxation).  2. Right ventricular systolic function is normal. The right ventricular size is normal. There is normal pulmonary artery systolic pressure. The estimated right ventricular systolic pressure is 56.3 mmHg.  3. The mitral valve is grossly normal. Trivial mitral valve regurgitation.  4. The aortic valve is tricuspid. Aortic valve regurgitation is mild. Aortic regurgitation PHT measures 603 msec.  5. Aortic dilatation noted. There is mild dilatation of the ascending aorta, measuring 41 mm.  6. The inferior vena cava is normal in size with <50% respiratory variability, suggesting right atrial pressure of 8 mmHg.  7. Agitated saline contrast bubble study was negative, with no evidence of right to left interatrial shunt. Comparison(s): Prior images reviewed side by side. LVEF remains normal range at 60-65%. Mildly dilated ascending aorta with mild aortic regurgitation. FINDINGS  Left Ventricle: Left ventricular ejection fraction, by estimation, is 60 to 65%. The left ventricle has normal function. The left ventricle has no regional wall motion abnormalities. The left ventricular internal cavity size was normal in size. There is  moderate asymmetric left ventricular hypertrophy of the septal segment. Left ventricular diastolic parameters are consistent with Grade I  diastolic dysfunction (impaired relaxation). Right Ventricle: The right ventricular size is normal. No increase in right ventricular wall thickness. Right ventricular systolic function is normal. There is normal pulmonary artery systolic pressure. The  tricuspid regurgitant velocity is 2.20 m/s, and  with an assumed right atrial pressure of 8 mmHg, the estimated right ventricular systolic pressure is 97.6 mmHg. Left Atrium: Left atrial size was normal in size. Right Atrium: Right atrial size was normal in size. Pericardium: There is no evidence of pericardial effusion. Mitral Valve: The mitral valve is grossly normal. Trivial mitral valve regurgitation. MV peak gradient, 4.1 mmHg. The mean mitral valve gradient is 2.0 mmHg. Tricuspid Valve: The tricuspid valve is grossly normal. Tricuspid valve regurgitation is trivial. Aortic Valve: The aortic valve is tricuspid. Aortic valve regurgitation is mild. Aortic regurgitation PHT measures 603 msec. Aortic valve mean gradient measures 5.0 mmHg. Aortic valve peak gradient measures 12.4 mmHg. Aortic valve area, by VTI measures 2.95 cm. Pulmonic Valve: The pulmonic valve was grossly normal. Pulmonic valve regurgitation is trivial. Aorta: The aortic root is normal in size and structure and aortic dilatation noted. There is mild dilatation of the ascending aorta, measuring 41 mm. Venous: The inferior vena cava is normal in size with less than 50% respiratory variability, suggesting right atrial pressure of 8 mmHg. IAS/Shunts: No atrial level shunt detected by color flow Doppler. Agitated saline contrast was given intravenously to evaluate for intracardiac shunting. Agitated saline contrast bubble study was negative, with no evidence of any interatrial shunt.  LEFT VENTRICLE PLAX 2D LVIDd:         5.20 cm   Diastology LVIDs:         3.30 cm   LV e' medial:    5.22 cm/s LV PW:         1.30 cm   LV E/e' medial:  15.2 LV IVS:        1.50 cm   LV e' lateral:   7.62 cm/s LVOT diam:     2.10 cm   LV E/e' lateral: 10.4 LV SV:         93 LV SV Index:   44 LVOT Area:     3.46 cm  RIGHT VENTRICLE RV Basal diam:  2.70 cm RV Mid diam:    2.20 cm RV S prime:     14.80 cm/s LEFT ATRIUM             Index        RIGHT ATRIUM           Index  LA diam:        4.30 cm 2.03 cm/m   RA Area:     13.20 cm LA Vol (A2C):   62.0 ml 29.27 ml/m  RA Volume:   24.40 ml  11.52 ml/m LA Vol (A4C):   46.4 ml 21.90 ml/m LA Biplane Vol: 54.2 ml 25.59 ml/m  AORTIC VALVE                    PULMONIC VALVE AV Area (Vmax):    2.81 cm     PV Vmax:       1.29 m/s AV Area (Vmean):   3.00 cm     PV Peak grad:  6.7 mmHg AV Area (VTI):     2.95 cm AV Vmax:           176.00 cm/s AV Vmean:          95.700 cm/s AV VTI:  0.315 m AV Peak Grad:      12.4 mmHg AV Mean Grad:      5.0 mmHg LVOT Vmax:         143.00 cm/s LVOT Vmean:        83.000 cm/s LVOT VTI:          0.268 m LVOT/AV VTI ratio: 0.85 AI PHT:            603 msec  AORTA Ao Root diam: 3.70 cm Ao Asc diam:  4.10 cm MITRAL VALVE                TRICUSPID VALVE MV Area (PHT): 5.06 cm     TR Peak grad:   19.4 mmHg MV Area VTI:   3.14 cm     TR Vmax:        220.00 cm/s MV Peak grad:  4.1 mmHg MV Mean grad:  2.0 mmHg     SHUNTS MV Vmax:       1.01 m/s     Systemic VTI:  0.27 m MV Vmean:      69.0 cm/s    Systemic Diam: 2.10 cm MV Decel Time: 150 msec MV E velocity: 79.50 cm/s MV A velocity: 114.00 cm/s MV E/A ratio:  0.70 Rozann Lesches MD Electronically signed by Rozann Lesches MD Signature Date/Time: 06/19/2022/10:34:16 AM    Final     Scheduled Meds:  acyclovir  400 mg Oral BID   allopurinol  300 mg Oral Daily   aspirin  300 mg Rectal Daily   Chlorhexidine Gluconate Cloth  6 each Topical Daily   diclofenac Sodium  2 g Topical QID   docusate sodium  100 mg Oral Daily   escitalopram  10 mg Oral Daily   fluticasone  2 spray Each Nare Daily   pantoprazole  40 mg Oral Daily   PHENObarbital  65 mg Intravenous BID   phenytoin (DILANTIN) IV  100 mg Intravenous Q8H   pregabalin  200 mg Oral BID   rosuvastatin  20 mg Oral Daily   Continuous Infusions:  lactated ringers 70 mL/hr at 06/21/22 0600   levETIRAcetam Stopped (06/21/22 0208)     LOS: 3 days   Critical Care Procedure Note Authorized and  Performed by: Murvin Natal MD  Total Critical Care time:  63 mins Due to a high probability of clinically significant, life threatening deterioration, the patient required my highest level of preparedness to intervene emergently and I personally spent this critical care time directly and personally managing the patient.  This critical care time included obtaining a history; examining the patient, pulse oximetry; ordering and review of studies; arranging urgent treatment with development of a management plan; evaluation of patient's response of treatment; frequent reassessment; and discussions with other providers.  This critical care time was performed to assess and manage the high probability of imminent and life threatening deterioration that could result in multi-organ failure.  It was exclusive of separately billable procedures and treating other patients and teaching time.    Little Ishikawa, DO Contact the [facility] Attending or Consulting provider [place] or covering provider during after hours [place],  06/21/2022, 7:15 AM

## 2022-06-21 NOTE — Progress Notes (Signed)
PT Missed Treatment Note  Patient Details Name: Joanna Reid MRN: [id number] DOB: 1965-08-24   Cancelled Treatment:    Reason Eval/Treat Not Completed: Patient's level of consciousness;Fatigue/lethargy limiting ability to participate (Pt was unable to participate in physical therapy today due to lethargy. Pt was very fatigued and asked to post pone physical therapy. Will continue to follow up as able and appropriate.)  Tomma Rakers, DPT, [place] [facility] Office: [phone] (Secure chat preferred)   Ander Purpura 06/21/2022, 1:46 PM

## 2022-06-21 NOTE — Progress Notes (Signed)
MB performed maintenance on electrodes. All impedances are below 10k ohms. No skin breakdown noted at P3 and F4 electrodes. Adjusted head wrap.

## 2022-06-21 NOTE — Progress Notes (Signed)
Subjective: Slightly more interactive today than yesterday.   Objective: Current vital signs: BP (!) 144/95 (BP Location: Left Arm)   Pulse 76   Temp 98.7 F (37.1 C) (Oral)   Resp 16   Ht _0  (1.626 m)   Wt 109.4 kg   SpO2 100%   BMI 41.40 kg/m  Vital signs in last 24 hours: Temp:  [98.2 F (36.8 C)-98.9 F (37.2 C)] 98.7 F (37.1 C) (12/14 2015) Pulse Rate:  [76-86] 76 (12/14 2015) Resp:  [15-18] 16 (12/14 2015) BP: (143-155)/(82-98) 144/95 (12/14 2015) SpO2:  [99 %-100 %] 100 % (12/14 2015)  Intake/Output from previous day: 12/13 0701 - 12/14 0700 In: 2400.6 [I.V.:1807.1; IV Piggyback:593.6] Out: 1000 [Urine:1000] Intake/Output this shift: Total I/O In: -  Out: 1000 [Urine:1000] Nutritional status:  Diet Order             DIET - DYS 1 Room service appropriate? Yes; Fluid consistency: Thin  Diet effective now                  HEENT: [place]/AT. EEG leads in place Lungs: Respirations unlabored Ext: Warm and well-perfused   Neurologic Exam: Mental status/Cognition: Eyes open, will gaze at examiner and make eye contact, but not following commands. Most verbal output consists of soft murmuring that is unintelligible. Did exclaim with a 3-word intelligible phrase after sustained noxious plantar stimulation.  Cranial nerves:  Fixates and tracks examiner as he moves. Blinks to threat in left temporal visual field but not right temporal visual field or left nasal field. No nystagmus. No ptosis. Eyes are conjugate. Mild facial asymmetry Motor: Moves all 4 limbs to noxious stimuli; upper extremities antigravity and lower extremity withdrawal movements. Movements are weaker and lower in amplitude on the right.  Sensation:  Patient withdrew to pain in all 4 extremities. Coordination/Complex Motor: Unable to assess Gait: Deferred  Lab Results: Results for orders placed or performed during the hospital encounter of 06/18/22 (from the past 48 hour(s))  Basic metabolic  panel     Status: Abnormal   Collection Time: 06/20/22  4:00 AM  Result Value Ref Range   Sodium 138 135 - 145 mmol/L   Potassium 3.2 (L) 3.5 - 5.1 mmol/L   Chloride 105 98 - 111 mmol/L   CO2 24 22 - 32 mmol/L   Glucose, Bld 95 70 - 99 mg/dL    Comment: Glucose reference range applies only to samples taken after fasting for at least 8 hours.   BUN 19 6 - 20 mg/dL   Creatinine, Ser 0.49 0.44 - 1.00 mg/dL   Calcium 7.6 (L) 8.9 - 10.3 mg/dL   GFR, Estimated >60 >60 mL/min    Comment: (NOTE) Calculated using the CKD-EPI Creatinine Equation (2021)    Anion gap 9 5 - 15    Comment: Performed at [facility] [address] [place], [place] [address]  CBC     Status: Abnormal   Collection Time: 06/20/22  4:00 AM  Result Value Ref Range   WBC 2.6 (L) 4.0 - 10.5 K/uL   RBC 2.44 (L) 3.87 - 5.11 MIL/uL   Hemoglobin 7.3 (L) 12.0 - 15.0 g/dL   HCT 21.4 (L) 36.0 - 46.0 %   MCV 87.7 80.0 - 100.0 fL   MCH 29.9 26.0 - 34.0 pg   MCHC 34.1 30.0 - 36.0 g/dL   RDW 18.4 (H) 11.5 - 15.5 %   Platelets 16 (LL) 150 - 400 K/uL  Comment: SPECIMEN CHECKED FOR CLOTS REPEATED TO VERIFY PLATELET COUNT CONFIRMED BY SMEAR THIS CRITICAL RESULT HAS VERIFIED AND BEEN CALLED TO B. COLE, RN BY HAYLEE HOWARD ON 12 13 2023 AT 0533, AND HAS BEEN READ BACK.     nRBC 0.8 (H) 0.0 - 0.2 %    Comment: Performed at [facility], [place] [address] [place], [place] [address]  Magnesium     Status: None   Collection Time: 06/20/22  4:00 AM  Result Value Ref Range   Magnesium 2.1 1.7 - 2.4 mg/dL    Comment: Performed at [facility] [address] [place], [place] [address]  CBC     Status: Abnormal   Collection Time: 06/21/22  6:30 AM  Result Value Ref Range   WBC 2.8 (L) 4.0 - 10.5 K/uL   RBC 2.42 (L) 3.87 - 5.11 MIL/uL   Hemoglobin 7.3 (L) 12.0 - 15.0 g/dL   HCT 21.7 (L) 36.0 - 46.0 %   MCV 89.7 80.0 - 100.0 fL   MCH 30.2 26.0 - 34.0 pg   MCHC 33.6 30.0 - 36.0 g/dL   RDW 18.5 (H) 11.5 - 15.5  %   Platelets 20 (LL) 150 - 400 K/uL    Comment: Immature Platelet Fraction may be clinically indicated, consider ordering this additional test [id number] THIS CRITICAL RESULT HAS VERIFIED AND BEEN CALLED TO S SCHULTZ, RN BY SHAMEKA WALKER ON 12 14 2023 AT 0736, AND HAS BEEN READ BACK.  REPEATED TO VERIFY CORRECTED ON 12/14 AT 1529: PREVIOUSLY REPORTED AS 20 Immature Platelet Fraction may be clinically indicated, consider ordering this additional test [id number] THIS CRITICAL RESULT HAS VERIFIED AND BEEN CALLED TO S SCHULTZ, RN BY SHAMEKA WALKER ON 12 14  2023 AT 0736, AND HAS BEEN READ BACK.     nRBC 0.7 (H) 0.0 - 0.2 %    Comment: Performed at [facility], [place] [address], [place], [place] [address]  Basic metabolic panel     Status: Abnormal   Collection Time: 06/21/22  6:30 AM  Result Value Ref Range   Sodium 139 135 - 145 mmol/L   Potassium 4.1 3.5 - 5.1 mmol/L   Chloride 107 98 - 111 mmol/L   CO2 24 22 - 32 mmol/L   Glucose, Bld 77 70 - 99 mg/dL    Comment: Glucose reference range applies only to samples taken after fasting for at least 8 hours.   BUN 16 6 - 20 mg/dL   Creatinine, Ser 0.58 0.44 - 1.00 mg/dL   Calcium 7.7 (L) 8.9 - 10.3 mg/dL   GFR, Estimated >60 >60 mL/min    Comment: (NOTE) Calculated using the CKD-EPI Creatinine Equation (2021)    Anion gap 8 5 - 15    Comment: Performed at [facility] [address] [place], [place] [address]   *Note: Due to a large number of results and/or encounters for the requested time period, some results have not been displayed. A complete set of results can be found in Results Review.    Recent Results (from the past 240 hour(s))  Blood Culture (routine x 2)     Status: None (Preliminary result)   Collection Time: 06/18/22 12:17 PM   Specimen: BLOOD RIGHT HAND  Result Value Ref Range Status   Specimen Description BLOOD RIGHT HAND  Final   Special Requests   Final    BOTTLES DRAWN AEROBIC AND ANAEROBIC Blood  Culture adequate volume   Culture  Final    NO GROWTH 3 DAYS Performed at [facility], [address], [place], [place] [address]    Report Status PENDING  Incomplete  Blood Culture (routine x 2)     Status: None (Preliminary result)   Collection Time: 06/18/22 12:17 PM   Specimen: Left Antecubital; Blood  Result Value Ref Range Status   Specimen Description LEFT ANTECUBITAL  Final   Special Requests   Final    BOTTLES DRAWN AEROBIC AND ANAEROBIC Blood Culture adequate volume   Culture   Final    NO GROWTH 3 DAYS Performed at [facility], [place], [address], [place], [place] [address]    Report Status PENDING  Incomplete  Resp Panel by RT-PCR (Flu A&B, Covid) Anterior Nasal Swab     Status: None   Collection Time: 06/18/22  1:18 PM   Specimen: Anterior Nasal Swab  Result Value Ref Range Status   SARS Coronavirus 2 by RT PCR NEGATIVE NEGATIVE Final    Comment: (NOTE) SARS-CoV-2 target nucleic acids are NOT DETECTED.  The SARS-CoV-2 RNA is generally detectable in upper respiratory specimens during the acute phase of infection. The lowest concentration of SARS-CoV-2 viral copies this assay can detect is 138 copies/mL. A negative result does not preclude SARS-Cov-2 infection and should not be used as the sole basis for treatment or other patient management decisions. A negative result may occur with  improper specimen collection/handling, submission of specimen other than nasopharyngeal swab, presence of viral mutation(s) within the areas targeted by this assay, and inadequate number of viral copies(<138 copies/mL). A negative result must be combined with clinical observations, patient history, and epidemiological information. The expected result is Negative.  Fact Sheet for Patients:  EntrepreneurPulse.com.au  Fact Sheet for Healthcare Providers:  IncredibleEmployment.be  This test is no t yet approved or cleared by the [place] FDA and   has been authorized for detection and/or diagnosis of SARS-CoV-2 by FDA under an Emergency Use Authorization (EUA). This EUA will remain  in effect (meaning this test can be used) for the duration of the COVID-19 declaration under Section 564(b)(1) of the Act, 21 U.S.C.section 360bbb-3(b)(1), unless the authorization is terminated  or revoked sooner.       Influenza A by PCR NEGATIVE NEGATIVE Final   Influenza B by PCR NEGATIVE NEGATIVE Final    Comment: (NOTE) The Xpert Xpress SARS-CoV-2/FLU/RSV plus assay is intended as an aid in the diagnosis of influenza from Nasopharyngeal swab specimens and should not be used as a sole basis for treatment. Nasal washings and aspirates are unacceptable for Xpert Xpress SARS-CoV-2/FLU/RSV testing.  Fact Sheet for Patients: EntrepreneurPulse.com.au  Fact Sheet for Healthcare Providers: IncredibleEmployment.be  This test is not yet approved or cleared by the [place] FDA and has been authorized for detection and/or diagnosis of SARS-CoV-2 by FDA under an Emergency Use Authorization (EUA). This EUA will remain in effect (meaning this test can be used) for the duration of the COVID-19 declaration under Section 564(b)(1) of the Act, 21 U.S.C. section 360bbb-3(b)(1), unless the authorization is terminated or revoked.  Performed at [facility], [address] [place], [place] [address]   Urine Culture     Status: None   Collection Time: 06/18/22  1:42 PM   Specimen: In/Out Cath Urine  Result Value Ref Range Status   Specimen Description   Final    IN/OUT CATH URINE Performed at [facility], [address], [place], [place] [address]    Special Requests   Final  NONE Performed at [facility], [address], [place], [place] [address]    Culture   Final    NO GROWTH Performed at [facility], [place] [address] [place], [place] [address]    Report Status 06/19/2022 FINAL  Final  MRSA Next  Gen by PCR, Nasal     Status: None   Collection Time: 06/18/22  5:29 PM   Specimen: Nasal Mucosa; Nasal Swab  Result Value Ref Range Status   MRSA by PCR Next Gen NOT DETECTED NOT DETECTED Final    Comment: (NOTE) The GeneXpert MRSA Assay (FDA approved for NASAL specimens only), is one component of a comprehensive MRSA colonization surveillance program. It is not intended to diagnose MRSA infection nor to guide or monitor treatment for MRSA infections. Test performance is not FDA approved in patients less than 82 years old. Performed at [facility], [address], [place], [place] [address]     Lipid Panel Recent Labs    06/19/22 0416  CHOL 39  TRIG 61  HDL 17*  CHOLHDL 2.3  VLDL 12  LDLCALC 10    Studies/Results: Overnight EEG with video  Result Date: 06/20/2022 Derek Jack, MD     06/21/2022  7:17 AM Patient Name: Manuela Schwartz Epilepsy Attending: Su Monks MD Referring Physician/Provider: Dr. Kerney Elbe Duration: 3818 on 06/19/22 to 1701 on 06/20/22  Patient history:  DEWEY NEUKAM is a 56 y.o. female with a history of seizure who is undergoing an EEG to evaluate for seizures.  Level of alertness: awake and asleep  Technical aspects: This EEG study was done with scalp electrodes positioned according to the 10-20 International system of electrode placement. Electrical activity was reviewed with band pass filter of 1-_0 , sensitivity of 7 uV/mm, display speed of 58m/sec with a _1  notched filter applied as appropriate. EEG data were recorded continuously and digitally stored.  Video monitoring was available and reviewed as appropriate.  Description: The best background was 4-5 Hz. This activity is reactive to stimulation. Drowsiness was manifested by background fragmentation; deeper stages of sleep were identified by K complexes and sleep spindles. There was focal slowing over the left hemisphere. Throughout the recording patient had continuous L sided lateralized  periodic discharges at 0.5-1.0 Hz with no sustained evolution. There were no definitive electrographic seizures during this recording. Photic stimulation and hyperventilation were not performed. Patient event button [facility]. There was no change in background before, during, or after the event.   ABNORMALITY & CLINICAL CORRELATION - continuous L sided lateralized periodic discharges at 0.5-1.0 Hz with no sustained evolution therefore not meeting criteria for electrographic seizure. Thes pattern is however high on the ictal-interictal continuum with high risk for seizure. - moderate diffuse slowing indicative of global cerebral dysfunction - left focal slowing indicative of focal cerebral dysfunction in that region Neurology will be notified of these findings. Recommend continuation of LTM. CSu Monks MD [facility] [phone] 7pm- 7am, please page neurology on call as listed in [place]    EEG adult  Result Date: 06/20/2022 SDerek Jack MD     06/20/2022  7:03 AM Routine EEG Report RLUPE HANDLEYis a 56y.o. female with a history of seizure who is undergoing an EEG to evaluate for seizures. Report: This EEG was acquired with electrodes placed according to the International 10-20 electrode system (including Fp1, Fp2, F3, F4, C3, C4, P3, P4, O1, O2, T3, T4, T5, T6, A1, A2, Fz,  Cz, Pz). The following electrodes were missing or displaced: none. The best background was 6 Hz with superimposed L focal slowing. This activity is reactive to stimulation. Drowsiness was manifested by background fragmentation; deeper stages of sleep were not identified. There were continuous L LPDs, at times 0.5-1 Hz, and at times accelerating to 2-3 Hz with subtle evolution concerning for possible seizure (2-3 times in this routine recording). Photic stimulation and hyperventilation were not performed. Impression and clinical correlation: This EEG was obtained while awake and drowsy  and is abnormal due to: - moderate diffuse slowing indicative of global cerebral dysfunction - Continuous L lateralized periodic discharges, at times 0.5-1 Hz, and at times accelerating to 2-3 Hz with subtle evolution concerning for possible seizure. This pattern is on the ictal-interictal continuum with high risk for seizures. Prolonged EEG for further characterization is recommended. Su Monks, MD [facility] [phone] If 7pm- 7am, please page neurology on call as listed in [place].    Medications: Scheduled:  acyclovir  400 mg Oral BID   allopurinol  300 mg Oral Daily   aspirin  300 mg Rectal Daily   Chlorhexidine Gluconate Cloth  6 each Topical Daily   diclofenac Sodium  2 g Topical QID   docusate sodium  100 mg Oral Daily   escitalopram  10 mg Oral Daily   fluticasone  2 spray Each Nare Daily   pantoprazole  40 mg Oral Daily   PHENObarbital  65 mg Intravenous BID   phenytoin (DILANTIN) IV  100 mg Intravenous Q8H   pregabalin  200 mg Oral BID   rosuvastatin  20 mg Oral Daily   Continuous:  lactated ringers 70 mL/hr at 06/21/22 0600   levETIRAcetam 1,500 mg (06/21/22 1410)   Assessment: TOI STELLY is a 56 y.o. 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 Witnessed tonic-clonic seizure occurred at [facility]. MRI showed old left parietal lobe infarct and adjacent cortical DWI signal most consistent with cortical edema from prolonged seizure activity. She was transferred to [facility] for LTM EEG. Seizures seen on EEG upon hook-up and clinical seizure activity also witnessed by staff. No prior seizure history per family. EEG yesterday night was reviewed with LPDs on the left noted but with no sustained evolution to electrographic seizure - findings were on the ictal-interictal continuum. Lacosamide was added to Keppra at that time. Dilantin also added.  - Focal status epilepticus, continues to be resolved based on LTM EEG tracings  today. Slight clinical improvement in terms of speech, but there is significant ongoing aphasia suspicious for extended postictal state versus possible subclinical seizure activity not detectable by EEG.  - EEG reports and interval reviews:  - 12:43 AM Wednesday: Further review by Dr. Rory Percy and Dr. April Manson of EEG.  Continued to have LPD's 0.5 to 1 Hz with no sustained evolution to seizures. Continued Keppra Vimpat and Dilantin at that time. - EEG report 7:03 AM Wednesday: This EEG was obtained while awake and drowsy and is abnormal due to  moderate diffuse slowing indicative of global cerebral dysfunction as well as continuous L lateralized periodic discharges, at times 0.5-1 Hz, and at times accelerating to 2-3 Hz with subtle evolution concerning for possible seizure. This pattern is on the ictal-interictal continuum with high risk for seizures.  - Review of LTM Wednesday evening at 7:00 PM: Continued LPDs. No evolution to electrographic seizures (discussed with Dr. Quinn Axe).  - LTM EEG report for today AM: Continuous L sided lateralized periodic  discharges at 0.5-1.0 Hz with no sustained evolution therefore not meeting criteria for electrographic seizure. This pattern is however high on the ictal-interictal continuum with high risk for seizure. Moderate diffuse slowing indicative of global cerebral dysfunction. Left focal slowing indicative of focal cerebral dysfunction in that region. This EEG is unchanged from the previous recording. - Imaging: - MRI brain 12/11:  - Foci of cortical diffusion-weighted signal abnormality along the margins of a chronic left parietal lobe infarct (within the left parietal lobe, at the left temporoparietal junction and within the lateral left occipital lobe). Subtle diffusion-weighted signal abnormality is also suspected within the posteromedial left thalamus. These findings are suspicious for seizure-related changes. On review of the images by Neurology, they appear unlikely  to reflect acute infarcts.   - Also described on the official Radiology report are "New 2 mm acute white matter infarct immediately lateral to the right caudate head. Redemonstrated tiny subacute infarcts within the anterior right corona radiata and left occipital lobe white matter (right MCA and left PCA vascular territories, respectively)". On review by Neurology, these findings are faint and appear most consistent with incidental artifact.  - Background chronic small vessel ischemic changes on MRI are unchanged from the recent prior brain MRI of 06/12/2022. Redemonstrated small chronic infarcts within both cerebellar hemispheres. Widespread multiple myeloma involving the bone.  - MRA head: No intracranial large vessel occlusion is identified. Intracranial atherosclerotic disease with multifocal stenoses, most notably as follows. Moderate/severe stenosis within the right ACA proximal A2 segment, new from the prior CTA head/neck of 12/26/2019. Sites of up to moderate stenosis within left PCA branches at the P2/P3 junction, progressed. Moderate stenosis within a right PCA branch at the P2/P3 junction, progressed. - Labs: - Hypokalemia 2.5->3.4 -> 3.2 - Hypomagensia 0.8 ->1.7 -> 2.1 - Normal BUN and Cr at this time with eGFR > 60 - Leukopenia, anemia and thormbocytopenia on CBC - TSH normal   Recommendations: Continue LTM EEG Continue Keppra, Dilantin, Vimpat and phenobarbital Added phenobarbital 65 mg IV BID on Wednesday, which had been discussed with family as an alternative to intubation with burst suppression. Family stated that they would prefer this more conservative approach for now. Risks/benefits of both approaches discussed with family. All questions answered. If she is not further improved on Friday, will discuss again with family the option of burst suppression protocol.  Continue ASA     LOS: 3 days   _0  signed: Dr. Kerney Elbe 06/21/2022  10:43 PM

## 2022-06-21 NOTE — Progress Notes (Signed)
Speech Language Pathology Treatment: Dysphagia  Patient Details Name: Joanna Reid MRN: [id number] DOB: 20-Feb-1966 Today's Date: 06/21/2022 Time: 9842-1031 SLP Time Calculation (min) (ACUTE ONLY): 30 min  Assessment / Plan / Recommendation Clinical Impression  Pt seen at bedside for PO trials to determine readiness for PO intake. Pt's husband was present during this session. Pt was more alert and appropriate today, and was following directions more consistently. Oral care was completed with suction. Pt then accepted trials of individual ice chips, thin liquid via straw, puree (applesauce), and solid (graham cracker). Pt tolerated ice chips, thin liquid, and puree without obvious oral issues or overt s/s aspiration. Pt exhibited extended oral prep of graham cracker, but did not exhibit overt s/s aspiration.  At this time, recommend puree diet with thin liquids, meds given in puree (crush large pills is needed). Safe swallow precautions posted at [facility]. RN and MD notified of diet recommendations. SLP will follow to assess diet tolerance and determine readiness for advanced textures.    HPI HPI: 56 year old female with refractory multiple myeloma status post relapse, hypertension, gout, GERD, type 2 diabetes mellitus, migraine headaches, stroke with residual aphasia presented to ED today due to ongoing weakness and lethargy.  She reportedly last had chemotherapy 2 weeks ago.  She has recently received PRBC and platelets in the cancer center.   Her work up revealed a K of 2.5, Hg was 7.7, WBC 2.6, platelets 15.  She noted no spontaneous bleeding. Pt had a seizure while being evaluated by MD in the ED. MRI increased diffusion signal in the left thalamus and parietal lobe. She also has a small incidental stroke in the right caudate. BSE requested.      SLP Plan  Continue with current plan of care      Recommendations for follow up therapy are one component of a multi-disciplinary discharge planning  process, led by the attending physician.  Recommendations may be updated based on patient status, additional functional criteria and insurance authorization.    Recommendations  Diet recommendations: Dysphagia 1 (puree);Thin liquid Liquids provided via: Straw Medication Administration: Whole meds with puree (crush large pills) Supervision: Full supervision/cueing for compensatory strategies Compensations: Minimize environmental distractions;Slow rate;Small sips/bites Postural Changes and/or Swallow Maneuvers: Seated upright 90 degrees                Oral Care Recommendations: Staff/trained caregiver to provide oral care;Oral care QID Follow Up Recommendations: Acute inpatient rehab (3hours/day) Assistance recommended at discharge: Frequent or constant Supervision/Assistance SLP Visit Diagnosis: Dysphagia, unspecified (R13.10) Plan: Continue with current plan of care          Joanna Reid B. Quentin Ore, [facility], [place] Speech Language Pathologist Office: [phone]  Shonna [name] 06/21/2022, 11:57 AM

## 2022-06-21 NOTE — Procedures (Signed)
Patient Name: Joanna Reid, Joanna Reid Attending: Su Monks MD Referring Physician/Provider: Dr. Kerney Elbe  Duration: 4196 on 06/20/22 to 1701 on 06/21/22   Patient history:  Joanna Reid is a 56 y.o. female with a history of seizure who is undergoing an EEG to evaluate for seizures.    Level of alertness: awake and asleep   Technical aspects: This EEG study was done with scalp electrodes positioned according to the 10-20 International system of electrode placement. Electrical activity was reviewed with band pass filter of 1-'70Hz'$ , sensitivity of 7 uV/mm, display speed of 71m/sec with a '60Hz'$  notched filter applied as appropriate. EEG data were recorded continuously and digitally stored.  Video monitoring was available and reviewed as appropriate.   Description: The best background was 4-5 Hz. This activity is reactive to stimulation. Drowsiness was manifested by background fragmentation; deeper stages of sleep were identified by K complexes and sleep spindles. There was focal slowing over the left hemisphere. Throughout the recording patient had continuous L sided lateralized periodic discharges at 0.5-1.0 Hz with no sustained evolution. There were no definitive electrographic seizures during this recording. Photic stimulation and hyperventilation were not performed.     ABNORMALITY & CLINICAL CORRELATION - continuous L sided lateralized periodic discharges at 0.5-1.0 Hz with no sustained evolution therefore not meeting criteria for electrographic seizure.  - moderate diffuse slowing indicative of global cerebral dysfunction - left focal slowing indicative of focal cerebral dysfunction in that region  This EEG is unchanged from the previous recording.   CSu Monks MD [facility] [phone] If 7pm- 7am, please page neurology [place] call [place]

## 2022-06-22 ENCOUNTER — Ambulatory Visit: Payer: BC Managed Care – PPO

## 2022-06-22 DIAGNOSIS — R569 Unspecified convulsions: Secondary | ICD-10-CM | POA: Diagnosis not present

## 2022-06-22 DIAGNOSIS — R531 Weakness: Secondary | ICD-10-CM | POA: Diagnosis not present

## 2022-06-22 MED ORDER — SODIUM CHLORIDE 0.9 % IV SOLN
100.0000 mg | Freq: Two times a day (BID) | INTRAVENOUS | Status: DC
  Administered 2022-06-22 – 2022-06-30 (×16): 100 mg via INTRAVENOUS
  Filled 2022-06-22 (×17): qty 10

## 2022-06-22 MED ORDER — DICLOFENAC SODIUM 1 % EX GEL
2.0000 g | Freq: Four times a day (QID) | CUTANEOUS | Status: DC | PRN
  Filled 2022-06-22: qty 100

## 2022-06-22 MED ORDER — MELATONIN 3 MG PO TABS
3.0000 mg | ORAL_TABLET | Freq: Every day | ORAL | Status: DC
  Administered 2022-06-26 – 2022-07-10 (×15): 3 mg via ORAL
  Filled 2022-06-22 (×16): qty 1

## 2022-06-22 MED ORDER — BISACODYL 10 MG RE SUPP
10.0000 mg | Freq: Once | RECTAL | Status: AC
  Administered 2022-06-22: 10 mg via RECTAL
  Filled 2022-06-22: qty 1

## 2022-06-22 NOTE — Evaluation (Signed)
Physical Therapy Evaluation Patient Details Name: Joanna Reid MRN: [id number] DOB: 1965-10-17 Today's Date: 06/22/2022  History of Present Illness  56 yo female presents to [facility] on 12/11 with headache and lethargy. + seizures in ED. MRI brain shows small incidental stroke in the right caudate. EEG shows Continuous L lateralized periodic discharges (high risk for seizures). Pt with multiple myeloma currently undergoing treatment last chemo 2 weeks ago.  Other PMH includes hypertension, gout, GERD, type 2 diabetes mellitus, migraine headaches, stroke with residual aphasia  Clinical Impression   Pt presents with generalized weakness, impaired sitting balance, poor activity tolerance, impaired cognition. Pt to benefit from acute PT to address deficits. Pt requiring max assist for moving to/from EOB, tolerates EOB sitting x10 minutes with min-mod truncal assist to maintain upright. Pt internally distracted and restless throughout session. Pt's daughter present for session, states they are working on getting 24/7 assist for pt once d/c, recommend AIR post-acutely to return to PLOF. PT to progress mobility as tolerated, and will continue to follow acutely.         Recommendations for follow up therapy are one component of a multi-disciplinary discharge planning process, led by the attending physician.  Recommendations may be updated based on patient status, additional functional criteria and insurance authorization.  Follow Up Recommendations Acute inpatient rehab (3hours/day)      Assistance Recommended at Discharge Frequent or constant Supervision/Assistance  Patient can return home with the following  Two people to help with walking and/or transfers;A lot of help with bathing/dressing/bathroom;Assistance with cooking/housework;Direct supervision/assist for medications management;Help with stairs or ramp for entrance    Equipment Recommendations Other (comment) (tbd)  Recommendations for  Other Services       Functional Status Assessment Patient has had a recent decline in their functional status and demonstrates the ability to make significant improvements in function in a reasonable and predictable amount of time.     Precautions / Restrictions Precautions Precautions: Fall Restrictions Weight Bearing Restrictions: No      Mobility  Bed Mobility Overal bed mobility: Needs Assistance Bed Mobility: Supine to Sit, Sit to Supine, Rolling Rolling: Max assist   Supine to sit: Max assist, HOB elevated Sit to supine: Max assist, HOB elevated   General bed mobility comments: assist for trunk and LE management, scooting to/from EOB, boost up, and repositioning once in supine    Transfers                   General transfer comment: nt - requiring at least min assist to maintain upright sitting and increasingly fatigued    Ambulation/Gait                  Stairs            Wheelchair Mobility    Modified Rankin (Stroke Patients Only) Modified Rankin (Stroke Patients Only) Pre-Morbid Rankin Score: No significant disability Modified Rankin: Severe disability     Balance Overall balance assessment: Needs assistance Sitting-balance support: Feet supported, Bilateral upper extremity supported Sitting balance-Leahy Scale: Poor Sitting balance - Comments: min-mod truncal assist to maintain upright sitting                                     Pertinent Vitals/Pain Pain Assessment Pain Assessment: Faces Faces Pain Scale: Hurts little more Pain Location: generalized Pain Descriptors / Indicators: Guarding, Grimacing Pain Intervention(s): Limited activity within patient's tolerance,  Monitored during session, Patient requesting pain meds-RN notified    Home Living Family/patient expects to be discharged to:: Private residence Living Arrangements: Spouse/significant other Available Help at Discharge: Family;Other (Comment)  (working on 24/7) Type of Home: Mobile home Home Access: Stairs to enter Entrance Stairs-Rails: Can reach both Entrance Stairs-Number of Steps: 3   Home Layout: One level Home Equipment: Cane - quad      Prior Function Prior Level of Function : Needs assist             Mobility Comments: pt using cane PRN since starting cancer treatment ADLs Comments: Per pt's daughter, pt is typically independent     Hand Dominance   Dominant Hand: Right    Extremity/Trunk Assessment   Upper Extremity Assessment Upper Extremity Assessment: Defer to OT evaluation    Lower Extremity Assessment Lower Extremity Assessment: Generalized weakness    Cervical / Trunk Assessment Cervical / Trunk Assessment: Kyphotic  Communication   Communication: Expressive difficulties;Receptive difficulties  Cognition Arousal/Alertness: Awake/alert Behavior During Therapy: Restless, Impulsive Overall Cognitive Status: Impaired/Different from baseline Area of Impairment: Attention, Following commands, Safety/judgement, Problem solving, Awareness                   Current Attention Level: Focused   Following Commands: Follows one step commands inconsistently Safety/Judgement: Decreased awareness of safety, Decreased awareness of deficits Awareness: Intellectual Problem Solving: Slow processing, Decreased initiation, Difficulty sequencing, Requires verbal cues, Requires tactile cues General Comments: pt very restless, pulling gown off and attempting to mobilize OOB without assist stating "get me out of here". Pt does not consistently respond correctly to yes/no questions        General Comments      Exercises     Assessment/Plan    PT Assessment Patient needs continued PT services  PT Problem List Decreased strength;Decreased mobility;Decreased safety awareness;Decreased activity tolerance;Decreased balance;Decreased knowledge of use of DME;Decreased cognition;Decreased coordination        PT Treatment Interventions DME instruction;Therapeutic activities;Gait training;Therapeutic exercise;Patient/family education;Balance training;Stair training;Functional mobility training;Neuromuscular re-education    PT Goals (Current goals can be found in the Care Plan section)  Acute Rehab PT Goals PT Goal Formulation: With patient Time For Goal Achievement: 07/06/22 Potential to Achieve Goals: Good    Frequency Min 4X/week     Co-evaluation               AM-PAC PT "6 Clicks" Mobility  Outcome Measure Help needed turning from your back to your side while in a flat bed without using bedrails?: A Lot Help needed moving from lying on your back to sitting on the side of a flat bed without using bedrails?: A Lot Help needed moving to and from a bed to a chair (including a wheelchair)?: Total Help needed standing up from a chair using your arms (e.g., wheelchair or bedside chair)?: Total Help needed to walk in hospital room?: Total Help needed climbing 3-5 steps with a railing? : Total 6 Click Score: 8    End of Session   Activity Tolerance: Patient limited by fatigue Patient left: in bed;with call bell/phone within reach;with bed alarm set;with family/visitor present Nurse Communication: Mobility status PT Visit Diagnosis: Other abnormalities of gait and mobility (R26.89);Muscle weakness (generalized) (M62.81)    Time: 1113-1140 PT Time Calculation (min) (ACUTE ONLY): 27 min   Charges:   PT Evaluation $PT Eval Low Complexity: 1 Low PT Treatments $Therapeutic Activity: 8-22 mins       Nariah Morgano S, PT DPT Acute  Rehabilitation Services Pager [phone]  Office [phone]   Louis Matte 06/22/2022, 1:23 PM

## 2022-06-22 NOTE — Procedures (Addendum)
Patient Name: [name], [name] Epilepsy Attending: Su Monks MD Referring Physician/Provider: Dr. Kerney Elbe  Duration: 5053 on 06/21/22 to 1626 on 06/22/22   Patient history:  Joanna Reid is a 56 y.o. female with a history of seizure who is undergoing an EEG to evaluate for seizures.    Level of alertness: awake and asleep   Technical aspects: This EEG study was done with scalp electrodes positioned according to the 10-20 International system of electrode placement. Electrical activity was reviewed with band pass filter of 1-'70Hz'$ , sensitivity of 7 uV/mm, display speed of 40m/sec with a '60Hz'$  notched filter applied as appropriate. EEG data were recorded continuously and digitally stored.  Video monitoring was available and reviewed as appropriate.   Description: The best background was 4-5 Hz with extended periods of semi-rhythmic delta activity. This activity is reactive to stimulation. Drowsiness was manifested by background fragmentation; deeper stages of sleep were identified by K complexes and sleep spindles. There was focal slowing over the left hemisphere. Patient had continuous L sided lateralized periodic discharges at 0.5-1.0 Hz with no sustained evolution. Over the last half of the recording these became less well-formed but persisted. There were no definitive electrographic seizures during this recording. Photic stimulation and hyperventilation were not performed.     ABNORMALITY & CLINICAL CORRELATION - continuous L sided lateralized periodic discharges at 0.5-1.0 Hz with no evolution therefore not meeting criteria for electrographic seizure. These became less prominent and less well-formed over the course of the recording. - moderate-to-severe diffuse slowing indicative of global cerebral dysfunction - left focal slowing indicative of focal cerebral dysfunction in that region   CSu Monks MD [facility] [phone] If 7pm- 7am, please page neurology  [place] call [place]

## 2022-06-22 NOTE — Progress Notes (Signed)
LTM maint complete - no skin breakdown [facility] monitored, Event button test confirmed by [facility]. ? ?

## 2022-06-22 NOTE — Progress Notes (Signed)
IP rehab admissions - per therapy recommendations, patient was screened for CIR candidacy by Karene Fry, RN, MSN.  At this time, patient appears to be a potential candidate for CIR.  I will place a rehab consult per protocol for full assessment.  Please call me with any questions.  [phone]

## 2022-06-22 NOTE — Progress Notes (Signed)
Patient writhing in bed and holding abdomen, suppository administered for constipation.  Husband at bedside states last BM was Sunday 06/17/22.

## 2022-06-22 NOTE — Progress Notes (Signed)
  Transition of Care [facility]) Screening Note   Patient Details  Name: Joanna Reid Date of Birth: June 28, 1966   Transition of Care [facility]) CM/SW Contact:    Pollie Friar, RN Phone Number: 06/22/2022, 3:17 PM   Pt is from home with spouse. Current recommendations are for CIR. Transition of Care Department [facility]) has reviewed patient. We will continue to monitor patient advancement through interdisciplinary progression rounds. If new patient transition needs arise, please place a TOC consult.

## 2022-06-22 NOTE — Progress Notes (Signed)
PROGRESS NOTE   Joanna Reid  FUX:[id number] DOB: 1965/09/01 DOA: 06/18/2022 PCP: Lindell Spar, MD   Chief Complaint  Patient presents with   Weakness   Level of care: Progressive  Brief Admission History:  56 year old female with refractory multiple myeloma status post relapse, hypertension, gout, GERD, type 2 diabetes mellitus, migraine headaches, stroke with residual aphasia presented to ED for ongoing weakness and lethargy. Last had chemotherapy 2 weeks ago.  She has recently received PRBC and platelets in the cancer center.  While in the ED patient had acute mental status change, unarousable with full tonic-clonic seizure lasting 2 to 3 minutes which improved with lorazepam.  Transferred to [facility] for further neuro evaluation and follow-up as well as continuous EEG.   Assessment and Plan:  New Onset Partial Status Epilepticus  Acute metabolic encephalopathy, minimally improving -Neurology following, EEG ongoing -Continue Keppra Dilantin and Vimpat, adding phenobarbital given ongoing mental status changes concerning for subclinical seizures.  Benzos as needed for breakthrough seizures.  Will follow-up with the next 24 hours, neurology has discussed with patient and family the possible need for intubation and ' burst suppression protocol' -Mental status does not appear to be making any marked improvement over the past 72 hours during my interviews  Cerebrovascular Disease Acute CVA in right caudate  -Continue aspirin and statin -Neurology following as above -TTE mild grade 1 diastolic dysfunction, bubble study negative -A1c 7.2, TSH 2.0 - permissive hypertension x 48 hours  - SLP/PT/OT evaluation  Severe Hypomagnesemia Hypokalemia -Magnesium 0.8 at intake, repleted, repeat 2.1 -Potassium 3.2 add 40 mEq IV x 1  Refractory Multiple Myeloma  - treated by Dr. Delton Coombes - last chem given reportedly 2 weeks ago  Pancytopenia  -Secondary to above - s/p 1  unit PRBC recommended by Dr. Federico Flake - Hg up to 8.4 - Platelets 18>>16 -no signs or symptoms of bleeding, hold off on further transfusion at this time - WBC 3.6   DVT prophylaxis: SCD only given above thrombocytopenia Code Status: full  Family Communication: Husband updated at bedside Disposition: Status is: Inpatient Remains inpatient appropriate given above ongoing need for evaluation, EEG IV medications   Consultants:  Neurology   Procedures:   Antimicrobials:    Subjective: Patient much more awake and alert this morning but unable to follow commands or interact in any meaningful way.  Husband updated at bedside  Objective: Vitals:   06/21/22 1557 06/21/22 2015 06/21/22 2330 06/22/22 0329  BP: (!) 155/90 (!) 144/95 (!) 144/97 (!) 145/100  Pulse: 85 76 86 75  Resp: _0 Temp: 98.6 F (37 C) 98.7 F (37.1 C) 98 F (36.7 C) 98 F (36.7 C)  TempSrc: Oral Oral Oral Oral  SpO2: 100% 100% 98% 100%  Weight:      Height:        Intake/Output Summary (Last 24 hours) at 06/22/2022 0801 Last data filed at 06/21/2022 2110 Gross per 24 hour  Intake --  Output 1000 ml  Net -1000 ml    Filed Weights   06/18/22 1128 06/18/22 1728 06/19/22 0500  Weight: 107 kg 109.3 kg 109.4 kg   Examination:  General exam: pt minimally interactive with family and staff similar to yesterday, unable to follow commands but speech is somewhat more intelligible albeit unrelated to prompting Respiratory system: Clear to auscultation. Respiratory effort normal. Cardiovascular system: normal S1 & S2 heard. No JVD, murmurs, rubs, gallops or clicks. 2+ pedal edema. Gastrointestinal system: Abdomen is  nondistended, soft and nontender. No organomegaly or masses felt. Normal bowel sounds heard. Central nervous system: somnolent, not following commands, mild twitching in lower extremities seen; facial droop present but improving;   Extremities: 1+ edema BLEs.  Skin: No rashes, lesions or  ulcers.  Data Reviewed: I have personally reviewed following labs and imaging studies  CBC: Recent Labs  Lab 06/18/22 1217 06/19/22 0416 06/20/22 0400 06/21/22 0630  WBC 2.6* 3.6* 2.6* 2.8*  NEUTROABS 1.3*  --   --   --   HGB 7.7* 8.4* 7.3* 7.3*  HCT 22.5* 25.5* 21.4* 21.7*  MCV 87.2 91.1 87.7 89.7  PLT 15* 18* 16* 20*     Basic Metabolic Panel: Recent Labs  Lab 06/18/22 1217 06/18/22 1523 06/19/22 0416 06/20/22 0400 06/21/22 0630  NA 136  --  138 138 139  K 2.5*  --  3.4* 3.2* 4.1  CL 100  --  107 105 107  CO2 25  --  _0 GLUCOSE 110*  --  134* 95 77  BUN 21*  --  22* 19 16  CREATININE 0.60  --  0.64 0.49 0.58  CALCIUM 8.0*  --  7.4* 7.6* 7.7*  MG  --  0.8* 1.7 2.1  --      CBG: Recent Labs  Lab 06/18/22 1945 06/19/22 0317 06/19/22 0446 06/19/22 0723 06/19/22 1111  GLUCAP 122* 126* 123* 120* 112*     Recent Results (from the past 240 hour(s))  Blood Culture (routine x 2)     Status: None (Preliminary result)   Collection Time: 06/18/22 12:17 PM   Specimen: BLOOD RIGHT HAND  Result Value Ref Range Status   Specimen Description BLOOD RIGHT HAND  Final   Special Requests   Final    BOTTLES DRAWN AEROBIC AND ANAEROBIC Blood Culture adequate volume   Culture   Final    NO GROWTH 4 DAYS Performed at [facility], [address] [place], [place] [address]    Report Status PENDING  Incomplete  Blood Culture (routine x 2)     Status: None (Preliminary result)   Collection Time: 06/18/22 12:17 PM   Specimen: Left Antecubital; Blood  Result Value Ref Range Status   Specimen Description LEFT ANTECUBITAL  Final   Special Requests   Final    BOTTLES DRAWN AEROBIC AND ANAEROBIC Blood Culture adequate volume   Culture   Final    NO GROWTH 4 DAYS Performed at [facility], [address], [place], [place] [address]    Report Status PENDING  Incomplete  Resp Panel by RT-PCR (Flu A&B, Covid) Anterior Nasal Swab     Status: None   Collection Time:  06/18/22  1:18 PM   Specimen: Anterior Nasal Swab  Result Value Ref Range Status   SARS Coronavirus 2 by RT PCR NEGATIVE NEGATIVE Final    Comment: (NOTE) SARS-CoV-2 target nucleic acids are NOT DETECTED.  The SARS-CoV-2 RNA is generally detectable in upper respiratory specimens during the acute phase of infection. The lowest concentration of SARS-CoV-2 viral copies this assay can detect is 138 copies/mL. A negative result does not preclude SARS-Cov-2 infection and should not be used as the sole basis for treatment or other patient management decisions. A negative result may occur with  improper specimen collection/handling, submission of specimen other than nasopharyngeal swab, presence of viral mutation(s) within the areas targeted by this assay, and inadequate number of viral copies(<138 copies/mL). A negative result must be combined with clinical observations, patient  history, and epidemiological information. The expected result is Negative.  Fact Sheet for Patients:  EntrepreneurPulse.com.au  Fact Sheet for Healthcare Providers:  IncredibleEmployment.be  This test is no t yet approved or cleared by the [place] FDA and  has been authorized for detection and/or diagnosis of SARS-CoV-2 by FDA under an Emergency Use Authorization (EUA). This EUA will remain  in effect (meaning this test can be used) for the duration of the COVID-19 declaration under Section 564(b)(1) of the Act, 21 U.S.C.section 360bbb-3(b)(1), unless the authorization is terminated  or revoked sooner.       Influenza A by PCR NEGATIVE NEGATIVE Final   Influenza B by PCR NEGATIVE NEGATIVE Final    Comment: (NOTE) The Xpert Xpress SARS-CoV-2/FLU/RSV plus assay is intended as an aid in the diagnosis of influenza from Nasopharyngeal swab specimens and should not be used as a sole basis for treatment. Nasal washings and aspirates are unacceptable for Xpert Xpress  SARS-CoV-2/FLU/RSV testing.  Fact Sheet for Patients: EntrepreneurPulse.com.au  Fact Sheet for Healthcare Providers: IncredibleEmployment.be  This test is not yet approved or cleared by the [place] FDA and has been authorized for detection and/or diagnosis of SARS-CoV-2 by FDA under an Emergency Use Authorization (EUA). This EUA will remain in effect (meaning this test can be used) for the duration of the COVID-19 declaration under Section 564(b)(1) of the Act, 21 U.S.C. section 360bbb-3(b)(1), unless the authorization is terminated or revoked.  Performed at [facility], [address], [place], [place] [address]   Urine Culture     Status: None   Collection Time: 06/18/22  1:42 PM   Specimen: In/Out Cath Urine  Result Value Ref Range Status   Specimen Description   Final    IN/OUT CATH URINE Performed at [facility], [address], [place], [place] [address]    Special Requests   Final    NONE Performed at [facility], [address], [place], [place] [address]    Culture   Final    NO GROWTH Performed at [facility], [place] [address], [place], [place] [address]    Report Status 06/19/2022 FINAL  Final  MRSA Next Gen by PCR, Nasal     Status: None   Collection Time: 06/18/22  5:29 PM   Specimen: Nasal Mucosa; Nasal Swab  Result Value Ref Range Status   MRSA by PCR Next Gen NOT DETECTED NOT DETECTED Final    Comment: (NOTE) The GeneXpert MRSA Assay (FDA approved for NASAL specimens only), is one component of a comprehensive MRSA colonization surveillance program. It is not intended to diagnose MRSA infection nor to guide or monitor treatment for MRSA infections. Test performance is not FDA approved in patients less than 53 years old. Performed at [facility], [address] [place], [place] [address]      Radiology Studies: Overnight EEG with video  Result Date: 06/20/2022 Derek Jack, MD     06/21/2022  7:17  AM Patient Name: Joanna Reid Epilepsy Attending: Su Monks MD Referring Physician/Provider: Dr. Kerney Elbe Duration: 4765 on 06/19/22 to 1701 on 06/20/22  Patient history:  Joanna Reid is a 56 y.o. female with a history of seizure who is undergoing an EEG to evaluate for seizures.  Level of alertness: awake and asleep  Technical aspects: This EEG study was done with scalp electrodes positioned according to the 10-20 International system of electrode placement. Electrical activity was reviewed with band pass filter of 1-_0 , sensitivity of 7 uV/mm,  display speed of 3m/sec with a _0  notched filter applied as appropriate. EEG data were recorded continuously and digitally stored.  Video monitoring was available and reviewed as appropriate.  Description: The best background was 4-5 Hz. This activity is reactive to stimulation. Drowsiness was manifested by background fragmentation; deeper stages of sleep were identified by K complexes and sleep spindles. There was focal slowing over the left hemisphere. Throughout the recording patient had continuous L sided lateralized periodic discharges at 0.5-1.0 Hz with no sustained evolution. There were no definitive electrographic seizures during this recording. Photic stimulation and hyperventilation were not performed. [facility] There was no change in background before, during, or after the event.   ABNORMALITY & CLINICAL CORRELATION - continuous L sided lateralized periodic discharges at 0.5-1.0 Hz with no sustained evolution therefore not meeting criteria for electrographic seizure. Thes pattern is however high on the ictal-interictal continuum with high risk for seizure. - moderate diffuse slowing indicative of global cerebral dysfunction - left focal slowing indicative of focal cerebral dysfunction in that region Neurology will be notified of these findings. Recommend continuation of LTM. CSu Monks MD  [facility] [phone] 7pm- 7am, please page neurology on call as listed in [place]     Scheduled Meds:  acyclovir  400 mg Oral BID   allopurinol  300 mg Oral Daily   aspirin  300 mg Rectal Daily   Chlorhexidine Gluconate Cloth  6 each Topical Daily   diclofenac Sodium  2 g Topical QID   docusate sodium  100 mg Oral Daily   escitalopram  10 mg Oral Daily   fluticasone  2 spray Each Nare Daily   pantoprazole  40 mg Oral Daily   PHENObarbital  65 mg Intravenous BID   phenytoin (DILANTIN) IV  100 mg Intravenous Q8H   pregabalin  200 mg Oral BID   rosuvastatin  20 mg Oral Daily   Continuous Infusions:  lactated ringers 70 mL/hr at 06/22/22 0520   levETIRAcetam 1,500 mg (06/22/22 0318)     LOS: 4 days    WLittle Ishikawa DO Contact overnight covering provider during after hours 7P -7A,  06/22/2022, 8:01 AM

## 2022-06-22 NOTE — Progress Notes (Addendum)
Subjective: Daughter and family are at the bedside. Daughter states the patient has been more restless today. Speaking more, however words don't make sense and at times are garbled/unintelligible. Patient is tracking, does not follow commands, does not answer orientation questions and is moving all extremities  Objective: Current vital signs: BP 133/70 (BP Location: Right Arm)   Pulse 79   Temp 98.4 F (36.9 C) (Oral)   Resp 18   Ht _0  (1.626 m)   Wt 109.4 kg   SpO2 100%   BMI 41.40 kg/m  Vital signs in last 24 hours: Temp:  [98 F (36.7 C)-98.7 F (37.1 C)] 98.4 F (36.9 C) (12/15 1528) Pulse Rate:  [75-86] 79 (12/15 1528) Resp:  [16-18] 18 (12/15 1528) BP: (133-158)/(70-106) 133/70 (12/15 1528) SpO2:  [93 %-100 %] 100 % (12/15 1528)  Intake/Output from previous day: 12/14 0701 - 12/15 0700 In: -  Out: 1000 [Urine:1000] Intake/Output this shift: Total I/O In: 806.4 [I.V.:606.4; IV Piggyback:200] Out: -  Nutritional status:  Diet Order             DIET - DYS 1 Room service appropriate? Yes; Fluid consistency: Thin  Diet effective now                  HEENT: [place]/AT. EEG leads in place Lungs: Respirations unlabored Ext: Warm and well-perfused   Neurologic Exam: Mental status/Cognition: Eyes open, will gaze at examiner and make eye contact, but not following commands. Does not answer orientation questions. She is attempting to speak and says words which may not always make sense and at times unintelligible/garbled words. She says short phrases such as "I just can't"  Cranial nerves:  Fixates and tracks examiner as she moves. Blinks to threat in left temporal visual field, right visual field with inconsistent blinking. No nystagmus. No ptosis. Eyes are conjugate. Mild facial asymmetry Motor: Moves all 4 limbs to noxious stimuli; upper extremities antigravity and lower extremity withdrawal movements. Movements are weaker and lower in amplitude on the right.   Sensation:  Patient withdrew to pain in all 4 extremities equally Coordination/Complex Motor: Unable to assess Gait: Deferred  Lab Results: Results for orders placed or performed during the hospital encounter of 06/18/22 (from the past 48 hour(s))  CBC     Status: Abnormal   Collection Time: 06/21/22  6:30 AM  Result Value Ref Range   WBC 2.8 (L) 4.0 - 10.5 K/uL   RBC 2.42 (L) 3.87 - 5.11 MIL/uL   Hemoglobin 7.3 (L) 12.0 - 15.0 g/dL   HCT 21.7 (L) 36.0 - 46.0 %   MCV 89.7 80.0 - 100.0 fL   MCH 30.2 26.0 - 34.0 pg   MCHC 33.6 30.0 - 36.0 g/dL   RDW 18.5 (H) 11.5 - 15.5 %   Platelets 20 (LL) 150 - 400 K/uL    Comment: Immature Platelet Fraction may be clinically indicated, consider ordering this additional test [id number] THIS CRITICAL RESULT HAS VERIFIED AND BEEN CALLED TO S SCHULTZ, RN BY SHAMEKA WALKER ON 12 14 2023 AT 0736, AND HAS BEEN READ BACK.  REPEATED TO VERIFY CORRECTED ON 12/14 AT 1529: PREVIOUSLY REPORTED AS 20 Immature Platelet Fraction may be clinically indicated, consider ordering this additional test [id number] THIS CRITICAL RESULT HAS VERIFIED AND BEEN CALLED TO S SCHULTZ, RN BY SHAMEKA WALKER ON 12 14  2023 AT 0736, AND HAS BEEN READ BACK.     nRBC 0.7 (H) 0.0 - 0.2 %    Comment: Performed at  [facility], [place] [address] [place], [place] [address]  Basic metabolic panel     Status: Abnormal   Collection Time: 06/21/22  6:30 AM  Result Value Ref Range   Sodium 139 135 - 145 mmol/L   Potassium 4.1 3.5 - 5.1 mmol/L   Chloride 107 98 - 111 mmol/L   CO2 24 22 - 32 mmol/L   Glucose, Bld 77 70 - 99 mg/dL    Comment: Glucose reference range applies only to samples taken after fasting for at least 8 hours.   BUN 16 6 - 20 mg/dL   Creatinine, Ser 0.58 0.44 - 1.00 mg/dL   Calcium 7.7 (L) 8.9 - 10.3 mg/dL   GFR, Estimated >60 >60 mL/min    Comment: (NOTE) Calculated using the CKD-EPI Creatinine Equation (2021)    Anion gap 8 5 - 15    Comment: Performed at  [facility] [address] [place], [place] [address]   *Note: Due to a large number of results and/or encounters for the requested time period, some results have not been displayed. A complete set of results can be found in Results Review.    Recent Results (from the past 240 hour(s))  Blood Culture (routine x 2)     Status: None (Preliminary result)   Collection Time: 06/18/22 12:17 PM   Specimen: BLOOD RIGHT HAND  Result Value Ref Range Status   Specimen Description BLOOD RIGHT HAND  Final   Special Requests   Final    BOTTLES DRAWN AEROBIC AND ANAEROBIC Blood Culture adequate volume   Culture   Final    NO GROWTH 4 DAYS Performed at [facility], [address], [place], [place] [address]    Report Status PENDING  Incomplete  Blood Culture (routine x 2)     Status: None (Preliminary result)   Collection Time: 06/18/22 12:17 PM   Specimen: Left Antecubital; Blood  Result Value Ref Range Status   Specimen Description LEFT ANTECUBITAL  Final   Special Requests   Final    BOTTLES DRAWN AEROBIC AND ANAEROBIC Blood Culture adequate volume   Culture   Final    NO GROWTH 4 DAYS Performed at [facility], [address], [place], [place] [address]    Report Status PENDING  Incomplete  Resp Panel by RT-PCR (Flu A&B, Covid) Anterior Nasal Swab     Status: None   Collection Time: 06/18/22  1:18 PM   Specimen: Anterior Nasal Swab  Result Value Ref Range Status   SARS Coronavirus 2 by RT PCR NEGATIVE NEGATIVE Final    Comment: (NOTE) SARS-CoV-2 target nucleic acids are NOT DETECTED.  The SARS-CoV-2 RNA is generally detectable in upper respiratory specimens during the acute phase of infection. The lowest concentration of SARS-CoV-2 viral copies this assay can detect is 138 copies/mL. A negative result does not preclude SARS-Cov-2 infection and should not be used as the sole basis for treatment or other patient management decisions. A negative result may occur with  improper  specimen collection/handling, submission of specimen other than nasopharyngeal swab, presence of viral mutation(s) within the areas targeted by this assay, and inadequate number of viral copies(<138 copies/mL). A negative result must be combined with clinical observations, patient history, and epidemiological information. The expected result is Negative.  Fact Sheet for Patients:  EntrepreneurPulse.com.au  Fact Sheet for Healthcare Providers:  IncredibleEmployment.be  This test is no t yet approved or cleared by the [place] and  has been authorized  for detection and/or diagnosis of SARS-CoV-2 by FDA under an Emergency Use Authorization (EUA). This EUA will remain  in effect (meaning this test can be used) for the duration of the COVID-19 declaration under Section 564(b)(1) of the Act, 21 U.S.C.section 360bbb-3(b)(1), unless the authorization is terminated  or revoked sooner.       Influenza A by PCR NEGATIVE NEGATIVE Final   Influenza B by PCR NEGATIVE NEGATIVE Final    Comment: (NOTE) The Xpert Xpress SARS-CoV-2/FLU/RSV plus assay is intended as an aid in the diagnosis of influenza from Nasopharyngeal swab specimens and should not be used as a sole basis for treatment. Nasal washings and aspirates are unacceptable for Xpert Xpress SARS-CoV-2/FLU/RSV testing.  Fact Sheet for Patients: EntrepreneurPulse.com.au  Fact Sheet for Healthcare Providers: IncredibleEmployment.be  This test is not yet approved or cleared by the [place] FDA and has been authorized for detection and/or diagnosis of SARS-CoV-2 by FDA under an Emergency Use Authorization (EUA). This EUA will remain in effect (meaning this test can be used) for the duration of the COVID-19 declaration under Section 564(b)(1) of the Act, 21 U.S.C. section 360bbb-3(b)(1), unless the authorization is terminated or revoked.  Performed at  [facility], [address], [place], [place] [address]   Urine Culture     Status: None   Collection Time: 06/18/22  1:42 PM   Specimen: In/Out Cath Urine  Result Value Ref Range Status   Specimen Description   Final    IN/OUT CATH URINE Performed at [facility], [address] [place], [place] [address]    Special Requests   Final    NONE Performed at [facility], [address], [place], [place] [address]    Culture   Final    NO GROWTH Performed at [facility], [place] [address], [place], [place] [address]    Report Status 06/19/2022 FINAL  Final  MRSA Next Gen by PCR, Nasal     Status: None   Collection Time: 06/18/22  5:29 PM   Specimen: Nasal Mucosa; Nasal Swab  Result Value Ref Range Status   MRSA by PCR Next Gen NOT DETECTED NOT DETECTED Final    Comment: (NOTE) The GeneXpert MRSA Assay (FDA approved for NASAL specimens only), is one component of a comprehensive MRSA colonization surveillance program. It is not intended to diagnose MRSA infection nor to guide or monitor treatment for MRSA infections. Test performance is not FDA approved in patients less than 71 years old. Performed at [facility], [address], [place], [place] [address]     Lipid Panel No results for input(s): "CHOL", "TRIG", "HDL", "CHOLHDL", "VLDL", "LDLCALC" in the last 72 hours.   Studies/Results: No results found.  Medications: Scheduled:  acyclovir  400 mg Oral BID   allopurinol  300 mg Oral Daily   aspirin  300 mg Rectal Daily   Chlorhexidine Gluconate Cloth  6 each Topical Daily   diclofenac Sodium  2 g Topical QID   docusate sodium  100 mg Oral Daily   escitalopram  10 mg Oral Daily   fluticasone  2 spray Each Nare Daily   pantoprazole  40 mg Oral Daily   PHENObarbital  65 mg Intravenous BID   phenytoin (DILANTIN) IV  100 mg Intravenous Q8H   pregabalin  200 mg Oral BID   rosuvastatin  20 mg Oral Daily   Continuous:  lactated ringers 70 mL/hr at 06/22/22 1505    levETIRAcetam 1,500 mg (06/22/22 1514)   Assessment:  Joanna Reid is a 56 y.o. 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 Witnessed tonic-clonic seizure occurred at [facility]. MRI showed old left parietal lobe infarct and adjacent cortical DWI signal most consistent with cortical edema from prolonged seizure activity. She was transferred to [facility] for LTM EEG. Seizures seen on EEG upon hook-up and clinical seizure activity also witnessed by staff. No prior seizure history per family. EEG yesterday night was reviewed with LPDs on the left noted but with no sustained evolution to electrographic seizure - findings were on the ictal-interictal continuum. Lacosamide and Dilantin were added to Keppra initially. Phenobarbital was subsequently started, which resulted in improvement in her EEG as well as subtle improvement on exam.   - Focal status epilepticus, continues to be resolved based on LTM EEG tracings today. Slight clinical improvement in terms of speech, but there is significant ongoing aphasia suspicious for extended postictal state versus possible subclinical seizure activity not detectable by EEG.  - EEG reports and interval reviews:  - 12:43 AM Wednesday: Further review by Dr. Rory Percy and Dr. April Manson of EEG.  Continued to have LPD's 0.5 to 1 Hz with no sustained evolution to seizures. Continued Keppra Vimpat and Dilantin at that time. - EEG report 7:03 AM Wednesday: This EEG was obtained while awake and drowsy and is abnormal due to  moderate diffuse slowing indicative of global cerebral dysfunction as well as continuous L lateralized periodic discharges, at times 0.5-1 Hz, and at times accelerating to 2-3 Hz with subtle evolution concerning for possible seizure. This pattern is on the ictal-interictal continuum with high risk for seizures.  - Review of LTM Wednesday evening at 7:00 PM: Continued LPDs. No evolution to electrographic seizures (discussed with  Dr. Quinn Axe).  - LTM EEG Thursday: Continuous L sided lateralized periodic discharges at 0.5-1.0 Hz with no sustained evolution therefore not meeting criteria for electrographic seizure. This pattern is however high on the ictal-interictal continuum with high risk for seizure. Moderate diffuse slowing indicative of global cerebral dysfunction. Left focal slowing indicative of focal cerebral dysfunction in that region. This EEG is unchanged from the previous recording. - LTM EEG for Friday: Continuous L sided lateralized periodic discharges at 0.5-1.0 Hz with no sustained evolution therefore not meeting criteria for electrographic seizure. Moderate diffuse slowing indicative of global cerebral dysfunction. Left focal slowing indicative of focal cerebral dysfunction in that region. This EEG is unchanged from the previous recording. - Imaging: - MRI brain 12/11:  - Foci of cortical diffusion-weighted signal abnormality along the margins of a chronic left parietal lobe infarct (within the left parietal lobe, at the left temporoparietal junction and within the lateral left occipital lobe). Subtle diffusion-weighted signal abnormality is also suspected within the posteromedial left thalamus. These findings are suspicious for seizure-related changes. On review of the images by Neurology, they appear unlikely to reflect acute infarcts.   - Also described on the official Radiology report are "New 2 mm acute white matter infarct immediately lateral to the right caudate head. Redemonstrated tiny subacute infarcts within the anterior right corona radiata and left occipital lobe white matter (right MCA and left PCA vascular territories, respectively)". On review by Neurology, these findings are faint and appear most consistent with incidental artifact.  - Background chronic small vessel ischemic changes on MRI are unchanged from the recent prior brain MRI of 06/12/2022. Redemonstrated small chronic infarcts within both  cerebellar hemispheres. Widespread multiple myeloma involving the bone.  - MRA head: No intracranial  large vessel occlusion is identified. Intracranial atherosclerotic disease with multifocal stenoses, most notably as follows. Moderate/severe stenosis within the right ACA proximal A2 segment, new from the prior CTA head/neck of 12/26/2019. Sites of up to moderate stenosis within left PCA branches at the P2/P3 junction, progressed. Moderate stenosis within a right PCA branch at the P2/P3 junction, progressed. - Current Labs: - Normal BUN and Cr at this time with eGFR > 60 - Leukopenia, anemia and thormbocytopenia on CBC    Recommendations: Continue LTM EEG Continue Keppra 1500 mg BID, Dilantin 19m Q8hr, Vimpat 100 mg BID and phenobarbital 65 mg BID Continue ASA Neurology will continue to follow   Addendum:  LTM EEG evening review by Dr. SQuinn Axe No significant change since AM: Continuous left-sided lateralized periodic discharges at 0.5-1.0 Hz, now poorly formed, without evolution, no electrographic seizures. Background delta slowing, at times rhythmic (GRDA).    LOS: 4 days    DBeulah GandyDNP, [facility]  Electronically signed: Dr. EKerney Elbe

## 2022-06-22 NOTE — Progress Notes (Signed)
vLTM maintenance  All impedances below 10kohms.  No skin breakdown noted at [place]  FP2  F7  F8  FZ

## 2022-06-23 ENCOUNTER — Inpatient Hospital Stay (HOSPITAL_COMMUNITY): Payer: BC Managed Care – PPO

## 2022-06-23 DIAGNOSIS — R531 Weakness: Secondary | ICD-10-CM | POA: Diagnosis not present

## 2022-06-23 DIAGNOSIS — R569 Unspecified convulsions: Secondary | ICD-10-CM | POA: Diagnosis not present

## 2022-06-23 DIAGNOSIS — R4182 Altered mental status, unspecified: Secondary | ICD-10-CM

## 2022-06-23 LAB — CULTURE, BLOOD (ROUTINE X 2)
Culture: NO GROWTH
Culture: NO GROWTH
Special Requests: ADEQUATE
Special Requests: ADEQUATE

## 2022-06-23 LAB — BASIC METABOLIC PANEL
Anion gap: 10 (ref 5–15)
BUN: 13 mg/dL (ref 6–20)
CO2: 24 mmol/L (ref 22–32)
Calcium: 7.8 mg/dL — ABNORMAL LOW (ref 8.9–10.3)
Chloride: 101 mmol/L (ref 98–111)
Creatinine, Ser: 0.43 mg/dL — ABNORMAL LOW (ref 0.44–1.00)
GFR, Estimated: >60 mL/min (ref >60)
Glucose, Bld: 109 mg/dL — ABNORMAL HIGH (ref 70–99)
Potassium: 3.7 mmol/L (ref 3.5–5.1)
Sodium: 135 mmol/L (ref 135–145)

## 2022-06-23 LAB — CBC
HCT: 20.9 % — ABNORMAL LOW (ref 36.0–46.0)
Hemoglobin: 7.1 g/dL — ABNORMAL LOW (ref 12.0–15.0)
MCH: 30 pg (ref 26.0–34.0)
MCHC: 34 g/dL (ref 30.0–36.0)
MCV: 88.2 fL (ref 80.0–100.0)
Platelets: 24 10*3/uL — CL (ref 150–400)
RBC: 2.37 MIL/uL — ABNORMAL LOW (ref 3.87–5.11)
RDW: 18.4 % — ABNORMAL HIGH (ref 11.5–15.5)
WBC: 4.1 10*3/uL (ref 4.0–10.5)
nRBC: 0.5 % — ABNORMAL HIGH (ref 0.0–0.2)

## 2022-06-23 MED ORDER — IOHEXOL 350 MG/ML SOLN
75.0000 mL | Freq: Once | INTRAVENOUS | Status: AC | PRN
  Administered 2022-06-23: 75 mL via INTRAVENOUS

## 2022-06-23 MED ORDER — BISACODYL 10 MG RE SUPP
10.0000 mg | Freq: Once | RECTAL | Status: AC
  Administered 2022-06-23: 10 mg via RECTAL
  Filled 2022-06-23: qty 1

## 2022-06-23 MED ORDER — INFLUENZA VAC SPLIT QUAD 0.5 ML IM SUSY
0.5000 mL | PREFILLED_SYRINGE | INTRAMUSCULAR | Status: AC
  Administered 2022-07-11: 0.5 mL via INTRAMUSCULAR
  Filled 2022-06-23: qty 0.5

## 2022-06-23 NOTE — Progress Notes (Signed)
Inpatient Rehab Admissions Coordinator:  Consult received. Attempted to meet with pt to discuss CIR. Nurse advised to postpone discussion pending pt/family awaiting meeting with neurology to determine plan of care. Will continue to follow.  Gayland Curry, [place], [place] Admissions Coordinator [phone]

## 2022-06-23 NOTE — Plan of Care (Signed)

## 2022-06-23 NOTE — Progress Notes (Signed)
Page sent to Dr. Cheral Marker: [name], R.-family requesting to speak w/you. Patient waxing/wane. Currently writhing in pain/discomfort. Please come to bedside. Dora Sims RN  Patient currently is waxing/waning in consciousness. Writhing in pain/discomfort. Hard to discern from where.  Family was unclear about plan from neurology standpoint. Discussed with primary attending Dr. Avon Gully who rec'd reaching out to Neurology to determine plan of care.

## 2022-06-23 NOTE — Progress Notes (Signed)
Subjective: Agitated and grabbing at abdomen complaining of belly pain at intervals since awakening today. Per husband she did have some belly pain yesterday but not as bad as today. First noted this on Thursday night but did not inform neurology about this on Friday.   Objective: Current vital signs: BP (!) 153/80 (BP Location: Left Arm)   Pulse 83   Temp 97.8 F (36.6 C) (Oral)   Resp 17   Ht _0  (1.626 m)   Wt 109.4 kg   SpO2 100%   BMI 41.40 kg/m  Vital signs in last 24 hours: Temp:  [97.8 F (36.6 C)-99.5 F (37.5 C)] 97.8 F (36.6 C) (12/16 1721) Pulse Rate:  [81-83] 83 (12/16 1721) Resp:  [16-22] 17 (12/16 1721) BP: (123-154)/(80-100) 153/80 (12/16 1721) SpO2:  [100 %] 100 % (12/16 1721)  Intake/Output from previous day: 12/15 0701 - 12/16 0700 In: 1678.1 [I.V.:1243.1; IV Piggyback:435] Out: 351 [Urine:350; Stool:1] Intake/Output this shift: Total I/O In: 439.4 [I.V.:439.4] Out: 900 [Urine:900] Nutritional status:  Diet Order             DIET - DYS 1 Room service appropriate? Yes; Fluid consistency: Thin  Diet effective now                  HEENT: [place]/AT. EEG leads in place. Lungs: Respirations unlabored Abdomen: Soft to palpation throughout. The patient reacts with expressions of increased pain while being palpated but without any consistent localization.  Ext: Warm and well perfused  Neurologic Exam: Mental status/Cognition: Drowsy with eyes open on stimulation, but will be closed mostly when not being stimulated. Will gaze at examiner and make eye contact, but not following commands. She has intermittent 1-3 word utterances that vary but all pertain to her complaints of pain. At times with unintelligible utterances. As also noted yesterday, she says short phrases such as "It hurts". Writhes in pain and exclaims, at times moving hands towards her abdomen. She does say "yes" when asked if her belly hurts. Does not answer orientation questions. Did make  brief eye contact with family members during assessment.  Cranial nerves:  Fixates and tracks examiner as he moves. Blinks to threat in left temporal visual field, right visual field with inconsistent blinking. No nystagmus. No ptosis. Will gaze to right and left with conjugate EOM but with a slight trend towards the left of midline. Mild facial asymmetry Motor: Moves all 4 limbs to noxious stimuli; upper extremities antigravity and lower extremities with withdrawal movements. Movements are significantly weaker and lower in amplitude on the right.  Sensation: Patient reacts to pain in all 4 extremities  Coordination/Complex Motor: Unable to assess Gait: Deferred   Lab Results: Results for orders placed or performed during the hospital encounter of 06/18/22 (from the past 48 hour(s))  CBC     Status: Abnormal   Collection Time: 06/23/22  6:38 AM  Result Value Ref Range   WBC 4.1 4.0 - 10.5 K/uL   RBC 2.37 (L) 3.87 - 5.11 MIL/uL   Hemoglobin 7.1 (L) 12.0 - 15.0 g/dL   HCT 20.9 (L) 36.0 - 46.0 %   MCV 88.2 80.0 - 100.0 fL   MCH 30.0 26.0 - 34.0 pg   MCHC 34.0 30.0 - 36.0 g/dL   RDW 18.4 (H) 11.5 - 15.5 %   Platelets 24 (LL) 150 - 400 K/uL    Comment: Immature Platelet Fraction may be clinically indicated, consider ordering this additional test [id number] CRITICAL VALUE NOTED.  VALUE IS  CONSISTENT WITH PREVIOUSLY REPORTED AND CALLED VALUE. REPEATED TO VERIFY    nRBC 0.5 (H) 0.0 - 0.2 %    Comment: Performed at [facility], [place] [address], [place], [place] [address]  Basic metabolic panel     Status: Abnormal   Collection Time: 06/23/22  6:38 AM  Result Value Ref Range   Sodium 135 135 - 145 mmol/L   Potassium 3.7 3.5 - 5.1 mmol/L   Chloride 101 98 - 111 mmol/L   CO2 24 22 - 32 mmol/L   Glucose, Bld 109 (H) 70 - 99 mg/dL    Comment: Glucose reference range applies only to samples taken after fasting for at least 8 hours.   BUN 13 6 - 20 mg/dL   Creatinine, Ser 0.43 (L) 0.44  - 1.00 mg/dL   Calcium 7.8 (L) 8.9 - 10.3 mg/dL   GFR, Estimated >60 >60 mL/min    Comment: (NOTE) Calculated using the CKD-EPI Creatinine Equation (2021)    Anion gap 10 5 - 15    Comment: Performed at [facility] [address], [place], [place] [address]   *Note: Due to a large number of results and/or encounters for the requested time period, some results have not been displayed. A complete set of results can be found in Results Review.    Recent Results (from the past 240 hour(s))  Blood Culture (routine x 2)     Status: None   Collection Time: 06/18/22 12:17 PM   Specimen: BLOOD RIGHT HAND  Result Value Ref Range Status   Specimen Description BLOOD RIGHT HAND  Final   Special Requests   Final    BOTTLES DRAWN AEROBIC AND ANAEROBIC Blood Culture adequate volume   Culture   Final    NO GROWTH 5 DAYS Performed at [facility], [address], [place], [place] [address]    Report Status 06/23/2022 FINAL  Final  Blood Culture (routine x 2)     Status: None   Collection Time: 06/18/22 12:17 PM   Specimen: Left Antecubital; Blood  Result Value Ref Range Status   Specimen Description LEFT ANTECUBITAL  Final   Special Requests   Final    BOTTLES DRAWN AEROBIC AND ANAEROBIC Blood Culture adequate volume   Culture   Final    NO GROWTH 5 DAYS Performed at [facility], [address], [place], [place] [address]    Report Status 06/23/2022 FINAL  Final  Resp Panel by RT-PCR (Flu A&B, Covid) Anterior Nasal Swab     Status: None   Collection Time: 06/18/22  1:18 PM   Specimen: Anterior Nasal Swab  Result Value Ref Range Status   SARS Coronavirus 2 by RT PCR NEGATIVE NEGATIVE Final    Comment: (NOTE) SARS-CoV-2 target nucleic acids are NOT DETECTED.  The SARS-CoV-2 RNA is generally detectable in upper respiratory specimens during the acute phase of infection. The lowest concentration of SARS-CoV-2 viral copies this assay can detect is 138 copies/mL. A negative result does  not preclude SARS-Cov-2 infection and should not be used as the sole basis for treatment or other patient management decisions. A negative result may occur with  improper specimen collection/handling, submission of specimen other than nasopharyngeal swab, presence of viral mutation(s) within the areas targeted by this assay, and inadequate number of viral copies(<138 copies/mL). A negative result must be combined with clinical observations, patient history, and epidemiological information. The expected result is Negative.  Fact Sheet for Patients:  EntrepreneurPulse.com.au  Fact  Sheet for Healthcare Providers:  IncredibleEmployment.be  This test is no t yet approved or cleared by the [place] FDA and  has been authorized for detection and/or diagnosis of SARS-CoV-2 by FDA under an Emergency Use Authorization (EUA). This EUA will remain  in effect (meaning this test can be used) for the duration of the COVID-19 declaration under Section 564(b)(1) of the Act, 21 U.S.C.section 360bbb-3(b)(1), unless the authorization is terminated  or revoked sooner.       Influenza A by PCR NEGATIVE NEGATIVE Final   Influenza B by PCR NEGATIVE NEGATIVE Final    Comment: (NOTE) The Xpert Xpress SARS-CoV-2/FLU/RSV plus assay is intended as an aid in the diagnosis of influenza from Nasopharyngeal swab specimens and should not be used as a sole basis for treatment. Nasal washings and aspirates are unacceptable for Xpert Xpress SARS-CoV-2/FLU/RSV testing.  Fact Sheet for Patients: EntrepreneurPulse.com.au  Fact Sheet for Healthcare Providers: IncredibleEmployment.be  This test is not yet approved or cleared by the [place] FDA and has been authorized for detection and/or diagnosis of SARS-CoV-2 by FDA under an Emergency Use Authorization (EUA). This EUA will remain in effect (meaning this test can be used) for the  duration of the COVID-19 declaration under Section 564(b)(1) of the Act, 21 U.S.C. section 360bbb-3(b)(1), unless the authorization is terminated or revoked.  Performed at [facility], [address] [place], [place] [address]   Urine Culture     Status: None   Collection Time: 06/18/22  1:42 PM   Specimen: In/Out Cath Urine  Result Value Ref Range Status   Specimen Description   Final    IN/OUT CATH URINE Performed at [facility], [address], [place], [place] [address]    Special Requests   Final    NONE Performed at [facility], [address], [place], [place] [address]    Culture   Final    NO GROWTH Performed at [facility], [place] [address], [place], [place] [address]    Report Status 06/19/2022 FINAL  Final  MRSA Next Gen by PCR, Nasal     Status: None   Collection Time: 06/18/22  5:29 PM   Specimen: Nasal Mucosa; Nasal Swab  Result Value Ref Range Status   MRSA by PCR Next Gen NOT DETECTED NOT DETECTED Final    Comment: (NOTE) The GeneXpert MRSA Assay (FDA approved for NASAL specimens only), is one component of a comprehensive MRSA colonization surveillance program. It is not intended to diagnose MRSA infection nor to guide or monitor treatment for MRSA infections. Test performance is not FDA approved in patients less than 37 years old. Performed at [facility], [address], [place], [place] [address]     Lipid Panel No results for input(s): "CHOL", "TRIG", "HDL", "CHOLHDL", "VLDL", "LDLCALC" in the last 72 hours.  Studies/Results: No results found.  Medications: Scheduled:  acyclovir  400 mg Oral BID   allopurinol  300 mg Oral Daily   aspirin  300 mg Rectal Daily   Chlorhexidine Gluconate Cloth  6 each Topical Daily   docusate sodium  100 mg Oral Daily   escitalopram  10 mg Oral Daily   fluticasone  2 spray Each Nare Daily   [START ON 06/24/2022] influenza vac split quadrivalent PF  0.5 mL Intramuscular Tomorrow-1000   melatonin  3 mg Oral  QHS   pantoprazole  40 mg Oral Daily   PHENObarbital  65 mg Intravenous BID   phenytoin (DILANTIN) IV  100 mg  Intravenous Q8H   pregabalin  200 mg Oral BID   rosuvastatin  20 mg Oral Daily   Continuous:  lacosamide (VIMPAT) IV 100 mg (06/23/22 1149)   lactated ringers 70 mL/hr at 06/23/22 1239   levETIRAcetam 1,500 mg (06/23/22 1121)     Assessment: LAMEISHA SCHUENEMANN is a 56 y.o. 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 She presented to [facility] on Monday morning complaining of headache and lethargy that had started on Sunday. She was suspected of having an infection and was started on vancomycin and cefepime. Platelets were low at 15, but no spontaneous bleeding was noted. While in the ED she became acutely somnolent with eyes rolled back in her head followed by GTC seizure lasting for about 2-3 minutes, which subsided with Ativan. She was postictal afterwards.  Keppra was loaded. MRI showed old left parietal lobe infarct and adjacent cortical DWI signal most consistent with cortical edema from prolonged seizure activity. She was transferred to [facility] for LTM EEG. After arrival, electrographic seizures were seen on EEG upon hook-up and clinical seizure activity was also witnessed by staff. She has no prior seizure history per family. LPDs on the left were seen on EEG but with no sustained evolution to electrographic seizure - findings were on the ictal-interictal continuum. Lacosamide and Dilantin were added to Keppra. Phenobarbital was subsequently started, which resulted in improvement in her EEG as well as subtle improvement on exam.   - Focal status epilepticus, continues to be resolved based on LTM EEG tracings today. Slight clinical improvement in terms of speech, but there is  significant ongoing aphasia suspicious for extended postictal state versus possible subclinical seizure activity not detectable by EEG.  - EEG reports and interval reviews:  - 12:43 AM Wednesday: Further review by Dr. Rory Percy and Dr. April Manson of EEG.  Continued to have LPD's 0.5 to 1 Hz with no sustained evolution to seizures. Continued Keppra Vimpat and Dilantin at that time. - EEG report 7:03 AM Wednesday: This EEG was obtained while awake and drowsy and is abnormal due to  moderate diffuse slowing indicative of global cerebral dysfunction as well as continuous L lateralized periodic discharges, at times 0.5-1 Hz, and at times accelerating to 2-3 Hz with subtle evolution concerning for possible seizure. This pattern is on the ictal-interictal continuum with high risk for seizures.  - Review of LTM Wednesday evening at 7:00 PM: Continued LPDs. No evolution to electrographic seizures (discussed with Dr. Quinn Axe).  - LTM EEG Thursday: Continuous L sided lateralized periodic discharges at 0.5-1.0 Hz with no sustained evolution therefore not meeting criteria for electrographic seizure. This pattern is however high on the ictal-interictal continuum with high risk for seizure. Moderate diffuse slowing indicative of global cerebral dysfunction. Left focal slowing indicative of focal cerebral dysfunction in that region. This EEG is unchanged from the previous recording. - LTM EEG for Friday AM: Continuous L sided lateralized periodic discharges at 0.5-1.0 Hz with no sustained evolution therefore not meeting criteria for electrographic seizure. Moderate diffuse slowing indicative of global cerebral dysfunction. Left focal slowing indicative of focal cerebral dysfunction in that region. This EEG is unchanged from the previous recording. - LTM EEG review for Friday PM: No significant change since the morning. Continuous left-sided lateralized periodic discharges at 0.5-1.0 Hz, now poorly formed, without evolution, no  electrographic seizures. Background delta slowing, at times rhythmic (GRDA).  - LTM EEG report for today AM: Sharp wave, left posterior quadrant; continuous slow, generalized and lateralized left hemisphere. This study showed evidence of epileptogenicity arising from left posterior quadrant. Additionally there is cortical dysfunction arising from left hemisphere likely secondary to underlying stroke, post-ictal state. Lastly there is moderate to severe diffuse encephalopathy, nonspecific to etiology. No seizures  were seen throughout the recording. - Imaging: - MRI brain 12/11:  - Foci of cortical diffusion-weighted signal abnormality along the margins of a chronic left parietal lobe infarct (the DWI changes are within the left parietal lobe, at the left temporoparietal junction and within the lateral left occipital lobe). These findings are suspicious for seizure-related changes. - Subtle diffusion-weighted signal abnormality within the posteromedial left thalamus was also noted. On review of the images by Neurology, this may represent seizure-related edema as well, appearing unlikely to reflect an acute infarct.   - Also described on the official Radiology report are "New 2 mm acute white matter infarct immediately lateral to the right caudate head. Redemonstrated tiny subacute infarcts within the anterior right corona radiata and left occipital lobe white matter (right MCA and left PCA vascular territories, respectively)". On review by Neurology, these findings are faint and appear most consistent with incidental artifact.  - Background chronic small vessel ischemic changes on MRI are unchanged from the recent prior brain MRI of 06/12/2022. Redemonstrated small chronic infarcts within both cerebellar hemispheres. Widespread multiple myeloma involving the bone.  - MRA head: No intracranial large vessel occlusion is identified. Intracranial atherosclerotic disease with multifocal stenoses, most notably as  follows. Moderate/severe stenosis within the right ACA proximal A2 segment, new from the prior CTA head/neck of 12/26/2019. Sites of up to moderate stenosis within left PCA branches at the P2/P3 junction, progressed. Moderate stenosis within a right PCA branch at the P2/P3 junction, progressed. - Current Labs: - Normal BUN and Cr at this time with eGFR > 60 - Leukopenia, anemia and thormbocytopenia on CBC     Recommendations: Continue LTM EEG Continue Keppra 1500 mg BID, Dilantin 177m Q8hr, Vimpat 100 mg BID and phenobarbital 65 mg BID Continue ASA I have discussed her abdominal pain with Dr. LAvon Gullyand he has ordered abdominal CT with contrast to further evaluate. May be pain secondary to constipation per Hospitalist team as she has had no bowel movements for 8 days. (burst suppression for 24-48 hours). Family now more willing to consider intubation and medically induced coma towards the goal of ensuring the resolution of subclinical seizure activity that may be missed on scalp EEG. Will need to await diagnosis and treatment of her moderate to severe abdominal pain, however, as sedation will mask any pain and could delay diagnosis of an acute abdomen or other significant/developing abdominal condition.      LOS: 5 days   _0  signed: Dr. EKerney Elbe12/16/2023  6:48 PM

## 2022-06-23 NOTE — Progress Notes (Addendum)
PROGRESS NOTE   MONEA PESANTEZ  OTR:[id number] DOB: 1966/06/10 DOA: 06/18/2022 PCP: Lindell Spar, MD   Chief Complaint  Patient presents with   Weakness   Level of care: Progressive  Brief Admission History:  56 year old female with refractory multiple myeloma status post relapse, hypertension, gout, GERD, type 2 diabetes mellitus, migraine headaches, stroke with residual aphasia presented to ED for ongoing weakness and lethargy. Last had chemotherapy 2 weeks ago.  She has recently received PRBC and platelets in the cancer center.  While in the ED patient had acute mental status change, unarousable with full tonic-clonic seizure lasting 2 to 3 minutes which improved with lorazepam.  Transferred to [place] for further neuro evaluation and follow-up as well as continuous EEG.   Assessment and Plan:  New Onset Partial Status Epilepticus  Acute metabolic encephalopathy, minimally improving -Neurology following, EEG ongoing -Continue Keppra Dilantin and Vimpat, adding phenobarbital given ongoing mental status changes concerning for subclinical seizures.  Benzos as needed for breakthrough seizures.  Will follow-up with the next 24 hours, neurology has discussed with patient and family the possible need for intubation and ' burst suppression protocol' -Mental status does not appear to be making much, if any, improvement over the past 72 hours.  Unspecified pain Patient grimmacing today - unclear etiology - occasionally grabs at abdomen - benign exam CTA pending to rule out ischemia/bleed - no oral contrast given no route to give oral quickly. Husband indicates only BM since last week was this am. Could be constipation.  Cerebrovascular Disease Acute CVA in right caudate  -Continue aspirin and statin -Neurology following as above -TTE mild grade 1 diastolic dysfunction, bubble study negative -A1c 7.2, TSH 2.0 - permissive hypertension x 48 hours - increase meds as  necessary - SLP/PT/OT evaluation ongoing, advance diet as tolerated, patient not able to follow commands currently unable to participate with PT OT while under continuous EEG  Severe Hypomagnesemia Hypokalemia -Magnesium 0.8 at intake, repleted, repeat 2.1 -Potassium 3.2 add 40 mEq IV x 1  Refractory Multiple Myeloma  - treated by Dr. Delton Coombes - last chem given reportedly 2 weeks ago  Pancytopenia  -Secondary to above - s/p 1 unit PRBC; hemoglobin trending downward slowly likely secondary to multiple blood draws - limit to q48h unless otherwise indicated - Hg up to 8.4 - Platelets improving minimally, 24 this yesterday; trough of 11 last week - consider platelet transfusion if bleeding or worsening labs -WBC improving to 4.1  DVT prophylaxis: SCD only given above thrombocytopenia, remains high risk for bleeding Code Status: full  Family Communication: Husband updated at bedside Disposition: Status is: Inpatient Remains inpatient appropriate given above ongoing need for evaluation, EEG IV medications   Consultants:  Neurology    Subjective: Patient remained awake this morning but unable to follow commands or interact in any meaningful way, patient's husband continues to try to feed her while she is less reactive, we discussed it may be unsafe to feed her at this time until her mental status improves.  Objective: Vitals:   06/22/22 1241 06/22/22 1528 06/22/22 2231 06/23/22 0356  BP: (!) 158/106 133/70 138/83 130/84  Pulse: 83 79 81 82  Resp: 16 18 (!) 22 16  Temp:  98.4 F (36.9 C) 99.5 F (37.5 C) 98.6 F (37 C)  TempSrc:  Oral Axillary Oral  SpO2: 93% 100% 100% 100%  Weight:      Height:        Intake/Output Summary (Last 24 hours)  at 06/23/2022 0805 Last data filed at 06/23/2022 0500 Gross per 24 hour  Intake 1678.12 ml  Output 351 ml  Net 1327.12 ml    Filed Weights   06/18/22 1128 06/18/22 1728 06/19/22 0500  Weight: 107 kg 109.3 kg 109.4 kg    Examination:  General exam: pt minimally interactive with family and staff, unable to follow commands, appears uncomfortable pulling at bed sheets and gown stating yes and no without any regards to the questions asked Respiratory system: Clear to auscultation. Respiratory effort normal. Cardiovascular system: normal S1 & S2 heard. No JVD, murmurs, rubs, gallops or clicks. 2+ pedal edema. Gastrointestinal system: Abdomen is nondistended, soft and nontender. No organomegaly or masses felt. Normal bowel sounds heard. Central nervous system: Somnolent but easily arousable, not following commands moving all 4 extremities spontaneously Extremities: 1+ edema BLEs.  Skin: No rashes, lesions or ulcers.  Data Reviewed: I have personally reviewed following labs and imaging studies  CBC: Recent Labs  Lab 06/18/22 1217 06/19/22 0416 06/20/22 0400 06/21/22 0630 06/23/22 0638  WBC 2.6* 3.6* 2.6* 2.8* 4.1  NEUTROABS 1.3*  --   --   --   --   HGB 7.7* 8.4* 7.3* 7.3* 7.1*  HCT 22.5* 25.5* 21.4* 21.7* 20.9*  MCV 87.2 91.1 87.7 89.7 88.2  PLT 15* 18* 16* 20* 24*     Basic Metabolic Panel: Recent Labs  Lab 06/18/22 1217 06/18/22 1523 06/19/22 0416 06/20/22 0400 06/21/22 0630 06/23/22 0638  NA 136  --  138 138 139 135  K 2.5*  --  3.4* 3.2* 4.1 3.7  CL 100  --  107 105 107 101  CO2 25  --  _0 GLUCOSE 110*  --  134* 95 77 109*  BUN 21*  --  22* _1 CREATININE 0.60  --  0.64 0.49 0.58 0.43*  CALCIUM 8.0*  --  7.4* 7.6* 7.7* 7.8*  MG  --  0.8* 1.7 2.1  --   --      CBG: Recent Labs  Lab 06/18/22 1945 06/19/22 0317 06/19/22 0446 06/19/22 0723 06/19/22 1111  GLUCAP 122* 126* 123* 120* 112*     Recent Results (from the past 240 hour(s))  Blood Culture (routine x 2)     Status: None   Collection Time: 06/18/22 12:17 PM   Specimen: BLOOD RIGHT HAND  Result Value Ref Range Status   Specimen Description BLOOD RIGHT HAND  Final   Special Requests   Final     BOTTLES DRAWN AEROBIC AND ANAEROBIC Blood Culture adequate volume   Culture   Final    NO GROWTH 5 DAYS Performed at [facility], [address], [place], [place] [address]    Report Status 06/23/2022 FINAL  Final  Blood Culture (routine x 2)     Status: None   Collection Time: 06/18/22 12:17 PM   Specimen: Left Antecubital; Blood  Result Value Ref Range Status   Specimen Description LEFT ANTECUBITAL  Final   Special Requests   Final    BOTTLES DRAWN AEROBIC AND ANAEROBIC Blood Culture adequate volume   Culture   Final    NO GROWTH 5 DAYS Performed at [facility], [address] [place], [place] [address]    Report Status 06/23/2022 FINAL  Final  Resp Panel by RT-PCR (Flu A&B, Covid) Anterior Nasal Swab     Status: None   Collection Time: 06/18/22  1:18 PM   Specimen: Anterior Nasal Swab  Result  Value Ref Range Status   SARS Coronavirus 2 by RT PCR NEGATIVE NEGATIVE Final    Comment: (NOTE) SARS-CoV-2 target nucleic acids are NOT DETECTED.  The SARS-CoV-2 RNA is generally detectable in upper respiratory specimens during the acute phase of infection. The lowest concentration of SARS-CoV-2 viral copies this assay can detect is 138 copies/mL. A negative result does not preclude SARS-Cov-2 infection and should not be used as the sole basis for treatment or other patient management decisions. A negative result may occur with  improper specimen collection/handling, submission of specimen other than nasopharyngeal swab, presence of viral mutation(s) within the areas targeted by this assay, and inadequate number of viral copies(<138 copies/mL). A negative result must be combined with clinical observations, patient history, and epidemiological information. The expected result is Negative.  Fact Sheet for Patients:  EntrepreneurPulse.com.au  Fact Sheet for Healthcare Providers:  IncredibleEmployment.be  This test is no t yet approved or cleared  by the [place] FDA and  has been authorized for detection and/or diagnosis of SARS-CoV-2 by FDA under an Emergency Use Authorization (EUA). This EUA will remain  in effect (meaning this test can be used) for the duration of the COVID-19 declaration under Section 564(b)(1) of the Act, 21 U.S.C.section 360bbb-3(b)(1), unless the authorization is terminated  or revoked sooner.       Influenza A by PCR NEGATIVE NEGATIVE Final   Influenza B by PCR NEGATIVE NEGATIVE Final    Comment: (NOTE) The Xpert Xpress SARS-CoV-2/FLU/RSV plus assay is intended as an aid in the diagnosis of influenza from Nasopharyngeal swab specimens and should not be used as a sole basis for treatment. Nasal washings and aspirates are unacceptable for Xpert Xpress SARS-CoV-2/FLU/RSV testing.  Fact Sheet for Patients: EntrepreneurPulse.com.au  Fact Sheet for Healthcare Providers: IncredibleEmployment.be  This test is not yet approved or cleared by the [place] FDA and has been authorized for detection and/or diagnosis of SARS-CoV-2 by FDA under an Emergency Use Authorization (EUA). This EUA will remain in effect (meaning this test can be used) for the duration of the COVID-19 declaration under Section 564(b)(1) of the Act, 21 U.S.C. section 360bbb-3(b)(1), unless the authorization is terminated or revoked.  Performed at [facility], [address] [place], [place] [address]   Urine Culture     Status: None   Collection Time: 06/18/22  1:42 PM   Specimen: In/Out Cath Urine  Result Value Ref Range Status   Specimen Description   Final    IN/OUT CATH URINE Performed at [facility], [address], [place], [place] [address]    Special Requests   Final    NONE Performed at [facility], [address], [place], [place] [address]    Culture   Final    NO GROWTH Performed at [facility], [place] [address], [place], [place] [address]    Report Status  06/19/2022 FINAL  Final  MRSA Next Gen by PCR, Nasal     Status: None   Collection Time: 06/18/22  5:29 PM   Specimen: Nasal Mucosa; Nasal Swab  Result Value Ref Range Status   MRSA by PCR Next Gen NOT DETECTED NOT DETECTED Final    Comment: (NOTE) The GeneXpert MRSA Assay (FDA approved for NASAL specimens only), is one component of a comprehensive MRSA colonization surveillance program. It is not intended to diagnose MRSA infection nor to guide or monitor treatment for MRSA infections. Test performance is not FDA approved in patients less than 36 years old. Performed  at [facility], [address] [place], [place] [address]      Radiology Studies: No results found.  Scheduled Meds:  acyclovir  400 mg Oral BID   allopurinol  300 mg Oral Daily   aspirin  300 mg Rectal Daily   Chlorhexidine Gluconate Cloth  6 each Topical Daily   docusate sodium  100 mg Oral Daily   escitalopram  10 mg Oral Daily   fluticasone  2 spray Each Nare Daily   melatonin  3 mg Oral QHS   pantoprazole  40 mg Oral Daily   PHENObarbital  65 mg Intravenous BID   phenytoin (DILANTIN) IV  100 mg Intravenous Q8H   pregabalin  200 mg Oral BID   rosuvastatin  20 mg Oral Daily   Continuous Infusions:  lacosamide (VIMPAT) IV 100 mg (06/22/22 2314)   lactated ringers 70 mL/hr at 06/22/22 2234   levETIRAcetam 1,500 mg (06/23/22 0242)     LOS: 5 days    Little Ishikawa, DO Contact overnight covering provider during after hours 7P -7A,  06/23/2022, 8:05 AM

## 2022-06-23 NOTE — Procedures (Addendum)
Patient Name: Joanna Reid  MRN: [id number]  Epilepsy Attending: [name]  Referring Physician/Provider: Dr Kerney Elbe  Duration: 06/22/2022 1626 to 06/23/2022 1626  Patient history:  56 y.o. female with a history of seizure who is undergoing an EEG to evaluate for seizures.   Level of alertness: Awake, sleep  AEDs during EEG study: Phenobarb, PHT, PGB, LEV, LCM  Technical aspects: This EEG study was done with scalp electrodes positioned according to the 10-20 International system of electrode placement. Electrical activity was reviewed with band pass filter of 1-'70Hz'$ , sensitivity of 7 uV/mm, display speed of 17m/sec with a '60Hz'$  notched filter applied as appropriate. EEG data were recorded continuously and digitally stored.  Video monitoring was available and reviewed as appropriate.  Description: No clear posterior dominant rhythm was seen. Sleep was characterized by sleep spindles (12 to 14 Hz), maximal frontocentral region.  EEG showed continuous generalized and lateralized left hemisphere 3 to 6 Hz theta-delta slowing. Sharp waves were noted in left posterior quadrant and were at times periodic at 0.5-'1hz'$ . Hyperventilation and photic stimulation were not performed.    Parts of study were difficult to interpret due to significant electrode artifact.   ABNORMALITY - Sharp wave, left posterior quadrant - Continuous slow, generalized and lateralized left hemisphere  IMPRESSION: This study showed evidence of epileptogenicity arising from left posterior quadrant. Additionally there is cortical dysfunction arising from left hemisphere likely secondary to underlying stroke, post-ictal state. Lastly there is moderate to severe diffuse encephalopathy, nonspecific etiology. No seizures  were seen throughout the recording.  EEG appears to be improving compared to previous day.   Omari Koslosky OBarbra Sarks

## 2022-06-23 NOTE — Progress Notes (Signed)
EEG maint complete.  ?

## 2022-06-24 DIAGNOSIS — R569 Unspecified convulsions: Secondary | ICD-10-CM | POA: Diagnosis not present

## 2022-06-24 DIAGNOSIS — R531 Weakness: Secondary | ICD-10-CM | POA: Diagnosis not present

## 2022-06-24 DIAGNOSIS — R4182 Altered mental status, unspecified: Secondary | ICD-10-CM | POA: Diagnosis not present

## 2022-06-24 LAB — HEPATIC FUNCTION PANEL
ALT: 15 U/L (ref 0–44)
AST: 23 U/L (ref 15–41)
Albumin: 2 g/dL — ABNORMAL LOW (ref 3.5–5.0)
Alkaline Phosphatase: 197 U/L — ABNORMAL HIGH (ref 38–126)
Bilirubin, Direct: 0.4 mg/dL — ABNORMAL HIGH (ref 0.0–0.2)
Indirect Bilirubin: 0.8 mg/dL (ref 0.3–0.9)
Total Bilirubin: 1.2 mg/dL (ref 0.3–1.2)
Total Protein: 4.5 g/dL — ABNORMAL LOW (ref 6.5–8.1)

## 2022-06-24 LAB — GLUCOSE, CAPILLARY: Glucose-Capillary: 98 mg/dL (ref 70–99)

## 2022-06-24 LAB — AMMONIA: Ammonia: 32 umol/L (ref 9–35)

## 2022-06-24 NOTE — Progress Notes (Signed)
Inpatient Rehab Admissions:  Inpatient Rehab Consult received.  I met with pt and pt's daughter at the bedside for rehabilitation assessment and to discuss goals and expectations of an inpatient rehab admission.  Pt was lethargic so spoke with daughter. Discussed average length of stay, insurance authorization requirement, discharge home after completion of CIR. Also discussed that pt is not medically ready for a potential CIR admission and would appreciate more participation and progress with therapies. She acknowledged understanding. Will continue to follow.  Signed: Gayland Curry, [place], [place] Admissions Coordinator [phone]

## 2022-06-24 NOTE — Progress Notes (Signed)
LTM maint complete - no skin breakdown . Leads replaced and glued. Patient very agitated. [facility] monitored, Event button test confirmed by [facility].

## 2022-06-24 NOTE — Progress Notes (Addendum)
PROGRESS NOTE   Joanna Reid  MRN:[id number] DOB: 11/03/1965 DOA: 06/18/2022 PCP: Patel, Rutwik K, MD   Chief Complaint  Patient presents with   Weakness   Level of care: Progressive  Brief Admission History:  56-year-old female with refractory multiple myeloma status post relapse, hypertension, gout, GERD, type 2 diabetes mellitus, migraine headaches, stroke with residual aphasia presented to ED for ongoing weakness and lethargy. Last had chemotherapy 2 weeks ago.  She has recently received PRBC and platelets in the cancer center.  While in the ED patient had acute mental status change, unarousable with full tonic-clonic seizure lasting 2 to 3 minutes which improved with lorazepam.  Transferred to [facility] for further neuro evaluation and follow-up as well as continuous EEG.   Assessment and Plan:  New Onset Partial Status Epilepticus  Acute metabolic encephalopathy, minimally improving -Neurology following, EEG ongoing -Continue Keppra Dilantin and Vimpat, adding phenobarbital given ongoing mental status changes concerning for subclinical seizures.  Benzos as needed for breakthrough seizures.  Will follow-up with the next 24 hours, neurology has discussed with patient and family the possible need for intubation and ' burst suppression protocol' -Mental status minimally improving over the past 24 hours -unable to answer simple questions but does not her head yes and no somewhat appropriately -Ammonia negative  Abdominal pain - secondary to nephrolithiasis(nonobstructive) Incidental liver lesions noted -Patient grimmacing yesterday, appears to be somewhat transient event now appears comfortable, abdomen benign -CTA notable for nonobstructing nephrolithasis -per husband these are somewhat chronic, patient nods her head yes when asked if this feels like a kidney stone but again unclear if her orientation is appropriate to answer this question -Given nonobstructive stone  continue IV fluids, initiate Flomax to improve urine output but otherwise no intervention required -Incidentally noted - multiple liver lesions -will require MRI abdomen in the near future -no need for urgent/emergent imaging at this time - Liver panel shows minimally elevated alk phos -ammonia negative  Cerebrovascular Disease Acute CVA in right caudate  -Continue aspirin and statin -Neurology following as above -TTE mild grade 1 diastolic dysfunction, bubble study negative -A1c 7.2, TSH 2.0 - permissive hypertension x 48 hours - increase meds as necessary - SLP/PT/OT evaluation ongoing, advance diet as tolerated, patient somewhat more awake this morning, shaking her head yes or no seemingly appropriately to questions but then speaks somewhat incoherently.  Following simple commands.  Severe Hypomagnesemia Hypokalemia -Magnesium 0.8 at intake, repleted, repeat 2.1 -Potassium 3.2 add 40 mEq IV x 1  Refractory Multiple Myeloma  - treated by Dr. Katragadda - last chem given reportedly 2 weeks ago  Pancytopenia  -Secondary to above - s/p 1 unit PRBC; hemoglobin trending downward slowly likely secondary to multiple blood draws - limit to q48h unless otherwise indicated - Hg up to 8.4 - Platelets improving minimally, 24 yesterday; trough of 11 last week - consider platelet transfusion if bleeding or worsening labs -WBC improving to 4.1  DVT prophylaxis: SCD only given above thrombocytopenia, remains high risk for bleeding Code Status: full  Family Communication: Husband updated at bedside Disposition: Status is: Inpatient Remains inpatient appropriate given above ongoing need for evaluation, EEG IV medications   Consultants:  Neurology    Subjective: No acute issues or events overnight, patient somewhat more awake and alert this morning, following simple commands but review of systems remains unable to be obtained.  Patient shakes her head yes or no to simple questions but does not  follow simple commands, nonverbal or   incoherently verbal at times.  Objective: Vitals:   06/23/22 1721 06/23/22 2019 06/23/22 2358 06/24/22 0418  BP: (!) 153/80 (!) 126/95 133/72 (!) 145/88  Pulse: 83 84 86 85  Resp: 17 15 (!) 21 17  Temp: 97.8 F (36.6 C) 98.1 F (36.7 C) 98.2 F (36.8 C) 97.6 F (36.4 C)  TempSrc: Oral Oral Axillary Oral  SpO2: 100% 100% 99% 100%  Weight:      Height:        Intake/Output Summary (Last 24 hours) at 06/24/2022 0754 Last data filed at 06/24/2022 0441 Gross per 24 hour  Intake 439.4 ml  Output 2000 ml  Net -1560.6 ml    Filed Weights   06/18/22 1128 06/18/22 1728 06/19/22 0500  Weight: 107 kg 109.3 kg 109.4 kg   Examination:  General exam: Awake and alert this morning more so than previous, follow simple commands, speaking incoherently but nods head yes or no to questions appropriately Respiratory system: Clear to auscultation. Respiratory effort normal. Cardiovascular system: normal S1 & S2 heard. No JVD, murmurs, rubs, gallops or clicks. 2+ pedal edema. Gastrointestinal system: Abdomen is nondistended, soft and nontender. No organomegaly or masses felt. Normal bowel sounds heard. Central nervous system: Somnolent but easily arousable, not following commands moving all 4 extremities spontaneously Extremities: Without edema Skin: No rashes, lesions or ulcers; small numerous ecchymoses with areas of petechiae noted throughout, stable from admission  Data Reviewed: I have personally reviewed following labs and imaging studies  CBC: Recent Labs  Lab 06/18/22 1217 06/19/22 0416 06/20/22 0400 06/21/22 0630 06/23/22 0638  WBC 2.6* 3.6* 2.6* 2.8* 4.1  NEUTROABS 1.3*  --   --   --   --   HGB 7.7* 8.4* 7.3* 7.3* 7.1*  HCT 22.5* 25.5* 21.4* 21.7* 20.9*  MCV 87.2 91.1 87.7 89.7 88.2  PLT 15* 18* 16* 20* 24*     Basic Metabolic Panel: Recent Labs  Lab 06/18/22 1217 06/18/22 1523 06/19/22 0416 06/20/22 0400 06/21/22 0630  06/23/22 0638  NA 136  --  138 138 139 135  K 2.5*  --  3.4* 3.2* 4.1 3.7  CL 100  --  107 105 107 101  CO2 25  --  _0 GLUCOSE 110*  --  134* 95 77 109*  BUN 21*  --  22* _1 CREATININE 0.60  --  0.64 0.49 0.58 0.43*  CALCIUM 8.0*  --  7.4* 7.6* 7.7* 7.8*  MG  --  0.8* 1.7 2.1  --   --      CBG: Recent Labs  Lab 06/18/22 1945 06/19/22 0317 06/19/22 0446 06/19/22 0723 06/19/22 1111  GLUCAP 122* 126* 123* 120* 112*     Recent Results (from the past 240 hour(s))  Blood Culture (routine x 2)     Status: None   Collection Time: 06/18/22 12:17 PM   Specimen: BLOOD RIGHT HAND  Result Value Ref Range Status   Specimen Description BLOOD RIGHT HAND  Final   Special Requests   Final    BOTTLES DRAWN AEROBIC AND ANAEROBIC Blood Culture adequate volume   Culture   Final    NO GROWTH 5 DAYS Performed at [facility], [address] [place], [place] [address]    Report Status 06/23/2022 FINAL  Final  Blood Culture (routine x 2)     Status: None   Collection Time: 06/18/22 12:17 PM   Specimen: Left Antecubital; Blood  Result Value Ref Range Status  Specimen Description LEFT ANTECUBITAL  Final   Special Requests   Final    BOTTLES DRAWN AEROBIC AND ANAEROBIC Blood Culture adequate volume   Culture   Final    NO GROWTH 5 DAYS Performed at [facility], [address] [place], [place] [address]    Report Status 06/23/2022 FINAL  Final  Resp Panel by RT-PCR (Flu A&B, Covid) Anterior Nasal Swab     Status: None   Collection Time: 06/18/22  1:18 PM   Specimen: Anterior Nasal Swab  Result Value Ref Range Status   SARS Coronavirus 2 by RT PCR NEGATIVE NEGATIVE Final    Comment: (NOTE) SARS-CoV-2 target nucleic acids are NOT DETECTED.  The SARS-CoV-2 RNA is generally detectable in upper respiratory specimens during the acute phase of infection. The lowest concentration of SARS-CoV-2 viral copies this assay can detect is 138 copies/mL. A negative result does not  preclude SARS-Cov-2 infection and should not be used as the sole basis for treatment or other patient management decisions. A negative result may occur with  improper specimen collection/handling, submission of specimen other than nasopharyngeal swab, presence of viral mutation(s) within the areas targeted by this assay, and inadequate number of viral copies(<138 copies/mL). A negative result must be combined with clinical observations, patient history, and epidemiological information. The expected result is Negative.  Fact Sheet for Patients:  EntrepreneurPulse.com.au  Fact Sheet for Healthcare Providers:  IncredibleEmployment.be  This test is no t yet approved or cleared by the [place] FDA and  has been authorized for detection and/or diagnosis of SARS-CoV-2 by FDA under an Emergency Use Authorization (EUA). This EUA will remain  in effect (meaning this test can be used) for the duration of the COVID-19 declaration under Section 564(b)(1) of the Act, 21 U.S.C.section 360bbb-3(b)(1), unless the authorization is terminated  or revoked sooner.       Influenza A by PCR NEGATIVE NEGATIVE Final   Influenza B by PCR NEGATIVE NEGATIVE Final    Comment: (NOTE) The Xpert Xpress SARS-CoV-2/FLU/RSV plus assay is intended as an aid in the diagnosis of influenza from Nasopharyngeal swab specimens and should not be used as a sole basis for treatment. Nasal washings and aspirates are unacceptable for Xpert Xpress SARS-CoV-2/FLU/RSV testing.  Fact Sheet for Patients: EntrepreneurPulse.com.au  Fact Sheet for Healthcare Providers: IncredibleEmployment.be  This test is not yet approved or cleared by the [place] FDA and has been authorized for detection and/or diagnosis of SARS-CoV-2 by FDA under an Emergency Use Authorization (EUA). This EUA will remain in effect (meaning this test can be used) for the  duration of the COVID-19 declaration under Section 564(b)(1) of the Act, 21 U.S.C. section 360bbb-3(b)(1), unless the authorization is terminated or revoked.  Performed at [facility], [address], [place], [place] [address]   Urine Culture     Status: None   Collection Time: 06/18/22  1:42 PM   Specimen: In/Out Cath Urine  Result Value Ref Range Status   Specimen Description   Final    IN/OUT CATH URINE Performed at [facility], [address], [place], [place] [address]    Special Requests   Final    NONE Performed at [facility], [address], [place], [place] [address]    Culture   Final    NO GROWTH Performed at [facility], [place] [address], [place], [place] [address]    Report Status 06/19/2022 FINAL  Final  MRSA Next Gen by PCR, Nasal  Status: None   Collection Time: 06/18/22  5:29 PM   Specimen: Nasal Mucosa; Nasal Swab  Result Value Ref Range Status   MRSA by PCR Next Gen NOT DETECTED NOT DETECTED Final    Comment: (NOTE) The GeneXpert MRSA Assay (FDA approved for NASAL specimens only), is one component of a comprehensive MRSA colonization surveillance program. It is not intended to diagnose MRSA infection nor to guide or monitor treatment for MRSA infections. Test performance is not FDA approved in patients less than 52 years old. Performed at [facility], [address] [place], [place] [address]      Radiology Studies: CT Angio Abd/Pel w/ and/or w/o  Result Date: 06/24/2022 CLINICAL DATA:  56 year old female with history of epigastric discomfort. History of multiple myeloma. * Tracking Code: BO * EXAM: CTA ABDOMEN AND PELVIS WITHOUT AND WITH CONTRAST TECHNIQUE: Multidetector CT imaging of the abdomen and pelvis was performed using the standard protocol during bolus administration of intravenous contrast. Multiplanar reconstructed images and MIPs were obtained and reviewed to evaluate the vascular anatomy. RADIATION DOSE REDUCTION: This exam was  performed according to the departmental dose-optimization program which includes automated exposure control, adjustment of the mA and/or kV according to patient size and/or use of iterative reconstruction technique. CONTRAST:  71m OMNIPAQUE IOHEXOL 350 MG/ML SOLN COMPARISON:  CT of the abdomen and pelvis 04/30/2022. FINDINGS: Comment: Portions of today's examination are limited by considerable patient motion. VASCULAR Aorta: Atherosclerosis throughout the abdominal aorta. Normal caliber aorta without aneurysm, dissection, vasculitis or significant stenosis. Celiac: Patent without evidence of aneurysm, dissection, vasculitis or significant stenosis. SMA: Patent without evidence of aneurysm, dissection, vasculitis or significant stenosis. Renals: Both renal arteries are patent without evidence of aneurysm, dissection, vasculitis, fibromuscular dysplasia or significant stenosis. IMA: Patent without evidence of aneurysm, dissection, vasculitis or significant stenosis. Inflow: Patent without evidence of aneurysm, dissection, vasculitis or significant stenosis. Proximal Outflow: Bilateral common femoral and visualized portions of the superficial and profunda femoral arteries are patent without evidence of aneurysm, dissection, vasculitis or significant stenosis. Veins: No obvious venous abnormality within the limitations of this arterial phase study. Review of the MIP images confirms the above findings. NON-VASCULAR Lower chest: Scattered areas of scarring are noted throughout the lung bases bilaterally. Hepatobiliary: Severe diffuse low attenuation throughout the hepatic parenchyma, indicative of a background of severe hepatic steatosis. There are multiple nodular appearing areas within the liver which appear spared from hepatic steatosis on precontrast images. Assessment for enhancement within these lesions is challenging on today's limited CT examination, and accordingly, these lesions are incompletely characterized,  largest of which is in the right lobe of the liver (axial image 25 of series 11) measuring 1.7 cm in diameter. No intra or extrahepatic biliary ductal dilatation. Gallbladder is moderately distended. Gallbladder wall thickness is normal. No pericholecystic fluid or surrounding inflammatory changes. No calcified gallstones are noted in the lumen of the gallbladder. Pancreas: No pancreatic mass. No pancreatic ductal dilatation. No pancreatic or peripancreatic fluid collections or inflammatory changes. Spleen: Unremarkable. Adrenals/Urinary Tract: 6 mm nonobstructive calculus in the right renal collecting system. No additional calculi are noted within the left renal collecting system, along the course of either ureter, or within the lumen of the urinary bladder. Bilateral kidneys and adrenal glands are normal in appearance. No hydroureteronephrosis. Urinary bladder is nearly decompressed around an indwelling Foley balloon catheter. Small amount of gas non dependently within the lumen of the urinary bladder is iatrogenic. Stomach/Bowel: The appearance of the stomach is normal. No pathologic  dilatation of small bowel or colon. Normal appendix. Lymphatic: No lymphadenopathy noted in the abdomen or pelvis. Reproductive: Status post hysterectomy.  Ovaries are atrophic. Other: No significant volume of ascites.  No pneumoperitoneum. Musculoskeletal: Areas of mixed lucency and sclerosis are noted throughout the visualized axial and appendicular skeleton, likely reflective of numerous osseous lesions from patient's known multiple myeloma. IMPRESSION: VASCULAR 1. No acute vascular abnormality in the abdomen or pelvis. 2. Aortic atherosclerosis. NON-VASCULAR 1. Severe hepatic steatosis. Multiple liver lesions poorly evaluated on today's CT examination, favored to represent areas of focal fatty sparing, however, underlying neoplasm is not excluded. Follow-up evaluation with nonemergent outpatient abdominal MRI with and without IV  gadolinium is recommended in the near future to characterize these lesions and exclude neoplasm. 2. 6 mm nonobstructive calculus in the right renal collecting system. No ureteral stones or findings of urinary tract obstruction. 3. Multiple osseous lesions, presumably reflective of patient's known multiple myeloma. 4. Additional incidental findings, as above. These results will be called to the ordering clinician or representative by the Radiologist Assistant, and communication documented in the PACS or [employer]. Electronically Signed   By: Vinnie Langton M.D.   On: 06/24/2022 07:48    Scheduled Meds:  acyclovir  400 mg Oral BID   allopurinol  300 mg Oral Daily   aspirin  300 mg Rectal Daily   Chlorhexidine Gluconate Cloth  6 each Topical Daily   docusate sodium  100 mg Oral Daily   escitalopram  10 mg Oral Daily   fluticasone  2 spray Each Nare Daily   influenza vac split quadrivalent PF  0.5 mL Intramuscular Tomorrow-1000   melatonin  3 mg Oral QHS   pantoprazole  40 mg Oral Daily   PHENObarbital  65 mg Intravenous BID   phenytoin (DILANTIN) IV  100 mg Intravenous Q8H   pregabalin  200 mg Oral BID   rosuvastatin  20 mg Oral Daily   Continuous Infusions:  lacosamide (VIMPAT) IV 100 mg (06/23/22 2245)   lactated ringers 70 mL/hr at 06/24/22 0438   levETIRAcetam 1,500 mg (06/23/22 2211)     LOS: 6 days    Little Ishikawa, DO Contact overnight covering provider during after hours 7P -7A,  06/24/2022, 7:54 AM

## 2022-06-24 NOTE — Procedures (Addendum)
Patient Name: Joanna Reid  MRN: [id number]  Epilepsy Attending: [name]  Referring Physician/Provider: Dr Kerney Elbe  Duration: 06/23/2022 1626 to 06/24/2022 1626   Patient history:  56 y.o. female with a history of seizure who is undergoing an EEG to evaluate for seizures.    Level of alertness: Awake, sleep   AEDs during EEG study: Phenobarb, PHT, PGB, LEV, LCM   Technical aspects: This EEG study was done with scalp electrodes positioned according to the 10-20 International system of electrode placement. Electrical activity was reviewed with band pass filter of 1-'70Hz'$ , sensitivity of 7 uV/mm, display speed of 8m/sec with a '60Hz'$  notched filter applied as appropriate. EEG data were recorded continuously and digitally stored.  Video monitoring was available and reviewed as appropriate.   Description: No clear posterior dominant rhythm was seen. Sleep was characterized by sleep spindles (12 to 14 Hz), maximal frontocentral region.  EEG showed continuous generalized 3 to 6 Hz theta-delta slowing. Hyperventilation and photic stimulation were not performed.     ABNORMALITY - Continuous slow, generalized   IMPRESSION: This study is suggestive of moderate to severe diffuse encephalopathy, nonspecific etiology. No seizures  were seen throughout the recording.    Kani Jobson OBarbra Sarks

## 2022-06-24 NOTE — Progress Notes (Signed)
NEUROLOGY CONSULTATION SERVICE PROGRESS NOTE   Date of service: June 24, 2022 Patient Name: Joanna Reid MRN:  [id number] DOB:  Nov 23, 1965  Brief HPI  CASSAUNDRA RASCH is a 56 y.o. 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 She presented to [facility] on Monday morning complaining of headache and lethargy that had started on Sunday. She was suspected of having an infection and was started on vancomycin and cefepime. Platelets were low at 15, but no spontaneous bleeding was noted. While in the ED she became acutely somnolent with eyes rolled back in her head followed by GTC seizure lasting for about 2-3 minutes, which subsided with Ativan. She was postictal afterwards.  Keppra was loaded. MRI showed old left parietal lobe infarct and adjacent cortical DWI signal most consistent with cortical edema from prolonged seizure activity. She was transferred to [facility] for LTM EEG. After arrival, electrographic seizures were seen on EEG upon hook-up and clinical seizure activity was also witnessed by staff. She has no prior seizure history per family. LPDs on the left were seen on EEG but with no sustained evolution to electrographic seizure - findings were on the ictal-interictal continuum. Lacosamide and Dilantin were added to Keppra. Phenobarbital was subsequently started, which resulted in improvement in her EEG as well as subtle improvement on exam.    Interval Hx   Overall, signs of continued gradual improvement in her speech and level of alertness seen today clinically, as well as improvement on LTM EEG. Patient waxing and waning, but able to respond to some questions with dysarthria. R side is still weaker than L side, suggestive of prolonged post-ictal state and also consistent with old left  hemispheric stroke.   Vitals   Vitals:   06/23/22 2358 06/24/22 0418 06/24/22 1005 06/24/22 1202  BP: 133/72 (!) 145/88 92/71 (!) 128/93  Pulse: 86 85 84 86  Resp: (!) _0 Temp: 98.2 F (36.8 C) 97.6 F (36.4 C) 98 F (36.7 C) 97.8 F (36.6 C)  TempSrc: Axillary Oral Oral Axillary  SpO2: 99% 100% 100% 100%  Weight:      Height:         Body mass index is 41.4 kg/m.  Physical Exam   General: Laying comfortably in bed; in no acute distress.  HENT: Normal oropharynx and mucosa. Normal external appearance of ears and nose.  Neck: Supple, no pain or tenderness  CV: No JVD. Generalized edema Pulmonary: Symmetric Chest rise. Normal respiratory effort.  Abdomen: Soft to touch, tender to palpation Ext: No cyanosis or deformity. +2 pedal edema bilaterally  Skin: warm, dry. Purpura and petechiae present.  Musculoskeletal: Normal digits and nails by inspection. No clubbing.   Neurologic Examination  Mental status/Cognition:  Drowsy, eyes open spontaneously. Makes eye contact but does not follow commands. She will answer 1-2 questions and then becomes more drowsy. She will then answer some questions with inappropriate responses. Example perseverating "[place]" to multiple questions today.  Speech: dysarthria present with sometimes delayed responses.  Cranial Nerves:  Pupils equal and reactive. Tracks during assessment. Blinks to threat in left consistently, inconsistent blinking to threat on the right. No nystagmus. EOM intact. Mild facial asymmetry.  Motor: Weaker on the R side LUE 5/5 RUE 4/5 LLE 4/5 RLE 3/5 Sensation: withdrew to pain in all 4 extremities.  Coordination/Complex Motor: Unable to assess Gait: deferred  Labs   Basic Metabolic Panel:  Lab Results  Component Value Date   NA 135 06/23/2022   K 3.7 06/23/2022   CO2 24 06/23/2022   GLUCOSE 109 (H) 06/23/2022   BUN 13 06/23/2022   CREATININE 0.43 (L) 06/23/2022   CALCIUM 7.8 (L) 06/23/2022    GFRNONAA >60 06/23/2022   GFRAA 51 (L) 04/06/2020   HbA1c:  Lab Results  Component Value Date   HGBA1C 7.2 (H) 06/19/2022   LDL:  Lab Results  Component Value Date   LDLCALC 10 06/19/2022   Urine Drug Screen:     Component Value Date/Time   LABOPIA NONE DETECTED 12/26/2019 1022   COCAINSCRNUR NONE DETECTED 12/26/2019 1022   LABBENZ POSITIVE (A) 12/26/2019 1022   AMPHETMU NONE DETECTED 12/26/2019 1022   THCU NONE DETECTED 12/26/2019 1022   LABBARB NONE DETECTED 12/26/2019 1022    Alcohol Level     Component Value Date/Time   ETH <10 12/26/2019 1022   Inpatient Medications:  Current Facility-Administered Medications:    acetaminophen (TYLENOL) tablet 650 mg, 650 mg, Oral, Q6H PRN **OR** acetaminophen (TYLENOL) suppository 650 mg, 650 mg, Rectal, Q6H PRN, Johnson, Clanford L, MD   acyclovir (ZOVIRAX) tablet 400 mg, 400 mg, Oral, BID, Johnson, Clanford L, MD, 400 mg at 06/22/22 1218   albuterol (PROVENTIL) (2.5 MG/3ML) 0.083% nebulizer solution 2.5 mg, 2.5 mg, Inhalation, Q4H PRN, Johnson, Clanford L, MD   allopurinol (ZYLOPRIM) tablet 300 mg, 300 mg, Oral, Daily, Johnson, Clanford L, MD   aspirin suppository 300 mg, 300 mg, Rectal, Daily, Johnson, Clanford L, MD, 300 mg at 06/24/22 1214   bisacodyl (DULCOLAX) EC tablet 5 mg, 5 mg, Oral, Daily PRN, Wynetta Emery, Clanford L, MD   Chlorhexidine Gluconate Cloth 2 % PADS 6 each, 6 each, Topical, Daily, Johnson, Clanford L, MD, 6 each at 06/24/22 1214   diclofenac Sodium (VOLTAREN) 1 % topical gel 2 g, 2 g, Topical, QID PRN, Little Ishikawa, MD   docusate sodium (COLACE) capsule 100 mg, 100 mg, Oral, Daily, Johnson, Clanford L, MD   escitalopram (LEXAPRO) tablet 10 mg, 10 mg, Oral, Daily, Johnson, Clanford L, MD   fentaNYL (SUBLIMAZE) injection 12.5 mcg, 12.5 mcg, Intravenous, Q2H PRN, Johnson, Clanford L, MD, 12.5 mcg at 06/24/22 0434   fluticasone (FLONASE) 50 MCG/ACT nasal spray 2 spray, 2 spray, Each Nare, Daily, Johnson,  Clanford L, MD, 2 spray at 06/19/22 1104   influenza vac split quadrivalent PF (FLUARIX) injection 0.5 mL, 0.5 mL, Intramuscular, Tomorrow-1000, Little Ishikawa, MD   lacosamide (VIMPAT) 100 mg in sodium chloride 0.9 % 25 mL IVPB, 100 mg, Intravenous, Q12H, Kerney Elbe, MD, Stopped at 06/24/22 1333   lactated ringers infusion, , Intravenous, Continuous, Johnson, Clanford L, MD, Last Rate: 70 mL/hr at 06/24/22 1502, Infusion Verify at 06/24/22 1502   levETIRAcetam (KEPPRA) IVPB 1500 mg/ 100 mL premix, 1,500 mg, Intravenous, Q12H, Greta Doom, MD, Stopped at 06/24/22 1248   LORazepam (ATIVAN) injection 1 mg, 1 mg, Intravenous, Q3H PRN, Wynetta Emery, Clanford L, MD, 1 mg at 06/18/22 2326   melatonin tablet 3 mg, 3 mg, Oral, QHS, Wolfe, Denise A, NP   ondansetron (ZOFRAN) tablet 4 mg, 4 mg, Oral, Q6H PRN **OR** ondansetron (ZOFRAN) injection 4 mg, 4 mg, Intravenous, Q6H PRN, Johnson, Clanford L, MD, 4 mg at 06/23/22 1431   oxyCODONE (Oxy IR/ROXICODONE) immediate release tablet 5 mg, 5 mg, Oral, Q4H PRN, Johnson, Clanford L, MD, 5 mg at  06/23/22 0211   pantoprazole (PROTONIX) EC tablet 40 mg, 40 mg, Oral, Daily, Johnson, Clanford L, MD   PHENObarbital (LUMINAL) injection 65 mg, 65 mg, Intravenous, BID, Kerney Elbe, MD, 65 mg at 06/24/22 1215   phenytoin (DILANTIN) injection 100 mg, 100 mg, Intravenous, Q8H, Kerney Elbe, MD, 100 mg at 06/24/22 1452   polyethylene glycol (MIRALAX / GLYCOLAX) packet 17 g, 17 g, Oral, Daily PRN, Johnson, Clanford L, MD   polyvinyl alcohol (LIQUIFILM TEARS) 1.4 % ophthalmic solution 1 drop, 1 drop, Both Eyes, Daily PRN, Johnson, Clanford L, MD   pregabalin (LYRICA) capsule 200 mg, 200 mg, Oral, BID, Johnson, Clanford L, MD   rosuvastatin (CRESTOR) tablet 20 mg, 20 mg, Oral, Daily, Mansy, Jan A, MD    Imaging and Diagnostic studies  Results for orders placed during the hospital encounter of 06/18/22  ECHOCARDIOGRAM COMPLETE BUBBLE STUDY IMPRESSIONS 1.  Left ventricular ejection fraction, by estimation, is 60 to 65%. The left ventricle has normal function. The left ventricle has no regional wall motion abnormalities. There is moderate asymmetric left ventricular hypertrophy of the septal segment. Left ventricular diastolic parameters are consistent with Grade I diastolic dysfunction (impaired relaxation). 2. Right ventricular systolic function is normal. The right ventricular size is normal. There is normal pulmonary artery systolic pressure. The estimated right ventricular systolic pressure is 40.9 mmHg. 3. The mitral valve is grossly normal. Trivial mitral valve regurgitation. 4. The aortic valve is tricuspid. Aortic valve regurgitation is mild. Aortic regurgitation PHT measures 603 msec. 5. Aortic dilatation noted. There is mild dilatation of the ascending aorta, measuring 41 mm. 6. The inferior vena cava is normal in size with <50% respiratory variability, suggesting right atrial pressure of 8 mmHg. 7. Agitated saline contrast bubble study was negative, with no evidence of right to left interatrial shunt. Comparison(s): Prior images reviewed side by side. LVEF remains normal range at 60-65%.      Impression  MARIANNE GOLIGHTLY is a 56 y.o. female with a PMHx significant for multiple myeloma, thrombocytopenia, previous left hemispheric stroke resulting in dysphasia and right sided weakness that later improved, as well as HTN, DM2 and migraines. Overall, signs of gradual improvement seen today on LTM EEG. Patient waxing and waning, but able to respond to some questions with dysarthria. R side is still weaker than L side, suggestive of post-ictal state.  EEG reports and interval reviews:  - 12:43 AM Wednesday: Further review by Dr. Rory Percy and Dr. April Manson of EEG.  Continued to have LPD's 0.5 to 1 Hz with no sustained evolution to seizures. Continued Keppra Vimpat and Dilantin at that time. - EEG report 7:03 AM Wednesday: This EEG was obtained while  awake and drowsy and is abnormal due to  moderate diffuse slowing indicative of global cerebral dysfunction as well as continuous L lateralized periodic discharges, at times 0.5-1 Hz, and at times accelerating to 2-3 Hz with subtle evolution concerning for possible seizure. This pattern is on the ictal-interictal continuum with high risk for seizures.  - Review of LTM Wednesday evening at 7:00 PM: Continued LPDs. No evolution to electrographic seizures (discussed with Dr. Quinn Axe).  - LTM EEG Thursday: Continuous L sided lateralized periodic discharges at 0.5-1.0 Hz with no sustained evolution therefore not meeting criteria for electrographic seizure. This pattern is however high on the ictal-interictal continuum with high risk for seizure. Moderate diffuse slowing indicative of global cerebral dysfunction. Left focal slowing indicative of focal cerebral dysfunction in that region. This EEG is unchanged from the previous  recording. - LTM EEG for Friday AM: Continuous L sided lateralized periodic discharges at 0.5-1.0 Hz with no sustained evolution therefore not meeting criteria for electrographic seizure. Moderate diffuse slowing indicative of global cerebral dysfunction. Left focal slowing indicative of focal cerebral dysfunction in that region. This EEG is unchanged from the previous recording. - LTM EEG review for Friday PM: No significant change since the morning. Continuous left-sided lateralized periodic discharges at 0.5-1.0 Hz, now poorly formed, without evolution, no electrographic seizures. Background delta slowing, at times rhythmic (GRDA).  - LTM EEG report for Saturday AM: Sharp wave, left posterior quadrant; continuous slow, generalized and lateralized left hemisphere. This study showed evidence of epileptogenicity arising from left posterior quadrant. Additionally there is cortical dysfunction arising from left hemisphere likely secondary to underlying stroke, post-ictal state. Lastly there is  moderate to severe diffuse encephalopathy, nonspecific to etiology. No seizures  were seen throughout the recording. - LTM EEG TODAY: Continuous slow, generalized. This study is suggestive of moderate to severe diffuse encephalopathy, nonspecific etiology. No seizures  were seen throughout the recording.  - Imaging: - MRI brain 12/11:  - Foci of cortical diffusion-weighted signal abnormality along the margins of a chronic left parietal lobe infarct (the DWI changes are within the left parietal lobe, at the left temporoparietal junction and within the lateral left occipital lobe). These findings are suspicious for seizure-related changes. - Subtle diffusion-weighted signal abnormality within the posteromedial left thalamus was also noted. On review of the images by Neurology, this may represent seizure-related edema as well, appearing unlikely to reflect an acute infarct.   - Also described on the official Radiology report are "New 2 mm acute white matter infarct immediately lateral to the right caudate head. Redemonstrated tiny subacute infarcts within the anterior right corona radiata and left occipital lobe white matter (right MCA and left PCA vascular territories, respectively)". On review by Neurology, these findings are faint and appear most consistent with incidental artifact.  - Background chronic small vessel ischemic changes on MRI are unchanged from the recent prior brain MRI of 06/12/2022. Redemonstrated small chronic infarcts within both cerebellar hemispheres. Widespread multiple myeloma involving the bone.  - MRA head: No intracranial large vessel occlusion is identified. Intracranial atherosclerotic disease with multifocal stenoses, most notably as follows. Moderate/severe stenosis within the right ACA proximal A2 segment, new from the prior CTA head/neck of 12/26/2019. Sites of up to moderate stenosis within left PCA branches at the P2/P3 junction, progressed. Moderate stenosis within a right  PCA branch at the P2/P3 junction, progressed. - Current Labs: (12/16 most recent) - Normal BUN and Cr at this time with eGFR > 60 - Leukopenia, anemia and thormbocytopenia on CBC  Recommendations  Continue LTM EEG Predict this will need to remain on for several days due to slow pattern of recovery so far Continue Keppra 1500 mg BID, Dilantin 110m Q8hr, Vimpat 100 mg BID and phenobarbital 65 mg BID Continue ASA Thrombocytopenia/Anemia, mgmt per primary team ______________________________________________________________________  Electronically signed: Dr. EKerney Elbe

## 2022-06-25 DIAGNOSIS — R569 Unspecified convulsions: Secondary | ICD-10-CM | POA: Diagnosis not present

## 2022-06-25 DIAGNOSIS — R4182 Altered mental status, unspecified: Secondary | ICD-10-CM | POA: Diagnosis not present

## 2022-06-25 LAB — CBC
HCT: 18.9 % — ABNORMAL LOW (ref 36.0–46.0)
Hemoglobin: 6.6 g/dL — CL (ref 12.0–15.0)
MCH: 30.1 pg (ref 26.0–34.0)
MCHC: 34.9 g/dL (ref 30.0–36.0)
MCV: 86.3 fL (ref 80.0–100.0)
Platelets: 29 10*3/uL — CL (ref 150–400)
RBC: 2.19 MIL/uL — ABNORMAL LOW (ref 3.87–5.11)
RDW: 18.5 % — ABNORMAL HIGH (ref 11.5–15.5)
WBC: 4.3 10*3/uL (ref 4.0–10.5)
nRBC: 1.2 % — ABNORMAL HIGH (ref 0.0–0.2)

## 2022-06-25 LAB — BASIC METABOLIC PANEL
Anion gap: 7 (ref 5–15)
BUN: 8 mg/dL (ref 6–20)
CO2: 25 mmol/L (ref 22–32)
Calcium: 7.7 mg/dL — ABNORMAL LOW (ref 8.9–10.3)
Chloride: 99 mmol/L (ref 98–111)
Creatinine, Ser: 0.49 mg/dL (ref 0.44–1.00)
GFR, Estimated: >60 mL/min (ref >60)
Glucose, Bld: 90 mg/dL (ref 70–99)
Potassium: 3.3 mmol/L — ABNORMAL LOW (ref 3.5–5.1)
Sodium: 131 mmol/L — ABNORMAL LOW (ref 135–145)

## 2022-06-25 LAB — GLUCOSE, CAPILLARY: Glucose-Capillary: 86 mg/dL (ref 70–99)

## 2022-06-25 LAB — HEMOGLOBIN AND HEMATOCRIT, BLOOD
HCT: 22.1 % — ABNORMAL LOW (ref 36.0–46.0)
Hemoglobin: 7.8 g/dL — ABNORMAL LOW (ref 12.0–15.0)

## 2022-06-25 LAB — PHENYTOIN LEVEL, TOTAL: Phenytoin Lvl: 15 ug/mL (ref 10.0–20.0)

## 2022-06-25 LAB — PHENOBARBITAL LEVEL: Phenobarbital: 9.2 ug/mL — ABNORMAL LOW (ref 15.0–40.0)

## 2022-06-25 LAB — PREPARE RBC (CROSSMATCH)

## 2022-06-25 MED ORDER — SODIUM CHLORIDE 0.9% IV SOLUTION: Freq: Once | INTRAVENOUS | Status: AC

## 2022-06-25 MED ORDER — POTASSIUM CHLORIDE 10 MEQ/100ML IV SOLN: 10.0000 meq | INTRAVENOUS | Status: AC

## 2022-06-25 MED ORDER — POTASSIUM CHLORIDE 10 MEQ/100ML IV SOLN
10.0000 meq | INTRAVENOUS | Status: AC
  Administered 2022-06-25 (×2): 10 meq via INTRAVENOUS
  Filled 2022-06-25 (×4): qty 100

## 2022-06-25 MED ORDER — TAMSULOSIN HCL 0.4 MG PO CAPS
0.4000 mg | ORAL_CAPSULE | Freq: Every day | ORAL | Status: AC
  Administered 2022-06-27 – 2022-07-01 (×5): 0.4 mg via ORAL
  Filled 2022-06-25 (×5): qty 1

## 2022-06-25 MED ORDER — SODIUM CHLORIDE 0.9% FLUSH
10.0000 mL | INTRAVENOUS | Status: DC | PRN
  Administered 2022-06-28: 10 mL

## 2022-06-25 MED ORDER — SODIUM CHLORIDE 0.9% FLUSH
10.0000 mL | Freq: Two times a day (BID) | INTRAVENOUS | Status: DC
  Administered 2022-06-25 – 2022-07-09 (×30): 10 mL
  Administered 2022-07-10: 20 mL
  Administered 2022-07-10: 10 mL

## 2022-06-25 NOTE — Progress Notes (Signed)
Subjective: Patient is lethargic and unable to answer questions.  Per husband at bedside, she is occasionally waking up and attempting to communicate but still not back to baseline.  He is happy that her motor strength is recovering.  ROS: Unable to obtain due to poor mental status  Examination  Vital signs in last 24 hours: Temp:  [97.9 F (36.6 C)-99 F (37.2 C)] 98.4 F (36.9 C) (12/18 0806) Pulse Rate:  [83-92] 83 (12/18 0806) Resp:  [15-18] 16 (12/18 0806) BP: (120-145)/(85-97) 120/89 (12/18 0806) SpO2:  [97 %-98 %] 97 % (12/18 0358)  General: lying in bed, not in apparent distress. Neuro: Opens eyes to repeated verbal and tactile stimulation, able to tell me her name but then perseverates on her name, did not answer any other orientation questions, did not follow any commands, unable to name any objects, PERRLA, no gaze deviation, no apparent facial asymmetry, antigravity strength in all 4 extremities  Basic Metabolic Panel: Recent Labs  Lab 06/18/22 1523 06/19/22 0416 06/19/22 0416 06/20/22 0400 06/21/22 0630 06/23/22 0638 06/25/22 0518  NA  --  138  --  138 139 135 131*  K  --  3.4*  --  3.2* 4.1 3.7 3.3*  CL  --  107  --  105 107 101 99  CO2  --  22  --  24 24 24 25  GLUCOSE  --  134*  --  95 77 109* 90  BUN  --  22*  --  19 16 13 8  CREATININE  --  0.64  --  0.49 0.58 0.43* 0.49  CALCIUM  --  7.4*   < > 7.6* 7.7* 7.8* 7.7*  MG 0.8* 1.7  --  2.1  --   --   --    < > = values in this interval not displayed.    CBC: Recent Labs  Lab 06/19/22 0416 06/20/22 0400 06/21/22 0630 06/23/22 0638 06/25/22 0518  WBC 3.6* 2.6* 2.8* 4.1 4.3  HGB 8.4* 7.3* 7.3* 7.1* 6.6*  HCT 25.5* 21.4* 21.7* 20.9* 18.9*  MCV 91.1 87.7 89.7 88.2 86.3  PLT 18* 16* 20* 24* 29*     Coagulation Studies: No results for input(s): "LABPROT", "INR" in the last 72 hours.  Imaging MRI brain with and without contrast 06/18/2022:  1. Intermittently motion degraded exam. 2. Foci of  cortical diffusion-weighted signal abnormality along the margins of a chronic left parietal lobe infarct (within the left parietal lobe, at the left temporoparietal junction and within the lateral left occipital lobe). Subtle diffusion-weighted signal abnormality is also suspected within the posteromedial left thalamus. These findings are suspicious for seizure-related changes. However, alternatively, these may reflect acute infarcts. 3. New 2 mm acute white matter infarct immediately lateral to the right caudate head. 4. Redemonstrated tiny subacute infarcts within the anterior right corona radiata and left occipital lobe white matter (right MCA and left PCA vascular territories, respectively). 5. Background chronic small vessel ischemic changes, as described and unchanged from the recent prior brain MRI of 06/12/2022. 6. Redemonstrated small chronic infarcts within both cerebellar hemispheres. 7. Widespread multiple myeloma.  ASSESSMENT AND PLAN: 56-year-old female with history of multiple medical myeloma,thrombocytopenia, left hemispheric stroke with resultant dysphagia and right-sided weakness which is gradually improving who presented with seizures.  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  Recommendations -Will check phenobarbital and phenytoin level and start weaning AEDs to minimize   sedation -Continue LTM EEG while we wean AEDs -Per review of oncology note 05/17/2022, patient was supposed to be on dexamethasone 20 mg twice daily 2 days a week along with Xpovio.  Discussed with hospitalist to see if we can curbside oncology and ask if this needs to be resumed. -If patient remains altered, can consider CT head to look for any acute abnormality -Continue seizure precautions -Management of rest of comorbidities per primary team -Discussed  plan with husband at bedside and with Dr. Avon Gully via secure chat  I have spent a total of 55 minutes with the patient reviewing hospital notes,  test results, labs and examining the patient as well as establishing an assessment and plan that was discussed personally with the patient's husband at bedside.  > 50% of time was spent in direct patient care.     Zeb Comfort Epilepsy [facility] For questions after 5pm please refer to AMION to reach the Neurologist on call

## 2022-06-25 NOTE — Progress Notes (Signed)
Speech Language Pathology Treatment: Dysphagia  Patient Details Name: Joanna Reid MRN: [id number] DOB: [birth year] Today's Date: 06/25/2022 Time: 0813-8871 SLP Time Calculation (min) (ACUTE ONLY): 15 min  Assessment / Plan / Recommendation Clinical Impression  Pt seen at bedside to assess tolerance of puree/thin liquid diet, and determine readiness to advance solide textures. Pt's husband was present during this session. Pt appeared sleepy today (husband indicated she did not sleep well last night). She was perseverating on "celebratin" and "promise". Inconsistent ability to follow verbal directions noted.  PO intake was poor this morning, with pt accepting only small boluses of applesauce and several boluses of Chocolate Ensure. Husband indicated PO intake was poor all weekend. No overt s/s aspiration noted on either texture. Pt is not ready to advance solid textures based on presentation, including confusion, drowsiness. Her intake has been minimal. Recommend dietician consult to maximize supplements given poor PO intake. SLP will continue to follow pt for readiness to advance textures.   HPI HPI: 56 year old female with refractory multiple myeloma status post relapse, hypertension, gout, GERD, type 2 diabetes mellitus, migraine headaches, stroke with residual aphasia presented to ED today due to ongoing weakness and lethargy.  She reportedly last had chemotherapy 2 weeks ago.  She has recently received PRBC and platelets in the cancer center.   Her work up revealed a K of 2.5, Hg was 7.7, WBC 2.6, platelets 15.  She noted no spontaneous bleeding. Pt had a seizure while being evaluated by MD in the ED. MRI increased diffusion signal in the left thalamus and parietal lobe. She also has a small incidental stroke in the right caudate. BSE requested.      SLP Plan  Continue with current plan of care      Recommendations for follow up therapy are one component of a multi-disciplinary discharge  planning process, led by the attending physician.  Recommendations may be updated based on patient status, additional functional criteria and insurance authorization.    Recommendations  Diet recommendations: Thin liquid;Dysphagia 1 (puree) Liquids provided via: Straw Medication Administration: Crushed with puree Supervision: Full supervision/cueing for compensatory strategies Compensations: Minimize environmental distractions;Slow rate;Small sips/bites Postural Changes and/or Swallow Maneuvers: Seated upright 90 degrees                Oral Care Recommendations: Staff/trained caregiver to provide oral care;Oral care QID Follow Up Recommendations: Acute inpatient rehab (3hours/day) Assistance recommended at discharge: Frequent or constant Supervision/Assistance SLP Visit Diagnosis: Dysphagia, unspecified (R13.10) Plan: Continue with current plan of care          Tabria Steines B. Quentin Ore, [facility], [place] Speech Language Pathologist Office: [phone]  Shonna [name] 06/25/2022, 9:03 AM

## 2022-06-25 NOTE — Progress Notes (Signed)
PROGRESS NOTE   Joanna Reid  IRS:[id number] DOB: [birth year] DOA: 06/18/2022 PCP: Lindell Spar, MD   Chief Complaint  Patient presents with   Weakness   Level of care: Progressive  Brief Admission History:  56 year old female with refractory multiple myeloma status post relapse, hypertension, gout, GERD, type 2 diabetes mellitus, migraine headaches, stroke with residual aphasia presented to ED for ongoing weakness and lethargy. Last had chemotherapy 2 weeks ago.  She has recently received PRBC and platelets in the cancer center.  While in the ED patient had acute mental status change, unarousable with full tonic-clonic seizure lasting 2 to 3 minutes which improved with lorazepam.  Transferred to [place] for further neuro evaluation and follow-up as well as continuous EEG.   Assessment and Plan:  New Onset Partial Status Epilepticus  Acute metabolic encephalopathy, improving -Neurology following, EEG ongoing -Continue Keppra Dilantin and Vimpat, adding phenobarbital given ongoing mental status changes concerning for subclinical seizures.  Benzos as needed for breakthrough seizures.  Will follow-up with the next 24 hours, neurology has discussed with patient and family the possible need for intubation and ' burst suppression protocol' -Mental status minimally improving over the past 48 hours -previously unable to answer simple questions; today she is more interactive and verbal seemingly appropriately. -Neuro to follow AED levels - may wean down meds as possible -No further indication for burst suppression protocol/ICU transfer at this time -Ammonia negative  Abdominal pain - secondary to nephrolithiasis(nonobstructive) Incidental liver lesions noted -Patient having transient episodes of what appears like discomfort but they are improving from prior, continue supportive care likely secondary to nephrolithiasis -CTA notable for nonobstructing nephrolithasis -per  husband these are somewhat chronic -Given nonobstructive stone continue IV fluids, initiate Flomax to improve urine output but otherwise no intervention required -Incidentally noted - multiple liver lesions -will require MRI abdomen in the near future -no need for urgent/emergent imaging at this time - Liver panel shows minimally elevated alk phos -ammonia negative  Cerebrovascular Disease Acute CVA in right caudate  -Continue aspirin and statin -Neurology following as above -TTE mild grade 1 diastolic dysfunction, bubble study negative -A1c 7.2, TSH 2.0 - permissive hypertension x 48 hours - increase meds as necessary - SLP/PT/OT evaluation ongoing, advance diet as tolerated, patient somewhat more awake this morning, shaking her head yes or no seemingly appropriately to questions but then speaks somewhat incoherently.  Following simple commands.  Severe Hypomagnesemia Hypokalemia -Magnesium 0.8 at intake, repleted, repeat 2.1 -Potassium 3.2 add 40 mEq IV x 1  Refractory Multiple Myeloma  - Followed by Dr. Delton Coombes - Last chemo given 2 weeks prior to admit  Pancytopenia  - Secondary to above - s/p 1 unit PRBC at intake, second unit to be transfused today - Hemoglobin trending downward slowly likely secondary to multiple blood draws - limit to q48h unless otherwise indicated - Platelets improving minimally, 29 today; trough of 11 last week - consider platelet transfusion if bleeding or worsening labs - WBC improving to 4.3  DVT prophylaxis: SCD only given above thrombocytopenia, remains high risk for bleeding Code Status: Full  Family Communication: Husband updated at bedside Disposition: Status is: Inpatient Remains inpatient appropriate given above ongoing need for evaluation, EEG IV medications   Consultants:  Neurology    Subjective: No acute issues or events overnight, patient somewhat more awake and alert this morning, following simple commands but having difficulty  orienting, ROS difficult to obtain.  Objective: Vitals:   06/24/22 1617 06/24/22 1956  06/24/22 2358 06/25/22 0358  BP: (!) 145/91 (!) 143/85 123/86 (!) 135/97  Pulse:  91 92 84  Resp: _0 Temp: 97.9 F (36.6 C) 98.6 F (37 C) 99 F (37.2 C) 99 F (37.2 C)  TempSrc: Axillary Axillary Axillary Oral  SpO2:  98% 97% 97%  Weight:      Height:        Intake/Output Summary (Last 24 hours) at 06/25/2022 0739 Last data filed at 06/25/2022 0402 Gross per 24 hour  Intake 2020.21 ml  Output 1800 ml  Net 220.21 ml    Filed Weights   06/18/22 1128 06/18/22 1728 06/19/22 0500  Weight: 107 kg 109.3 kg 109.4 kg   Examination:  General exam: Awake and alert this morning more so than previous, follow simple commands, speaking incoherently but nods head yes or no to questions appropriately Respiratory system: Clear to auscultation. Respiratory effort normal. Cardiovascular system: normal S1 & S2 heard. No JVD, murmurs, rubs, gallops or clicks. 2+ pedal edema. Gastrointestinal system: Abdomen is nondistended, soft and nontender. No organomegaly or masses felt. Normal bowel sounds heard. Central nervous system: Somnolent but easily arousable, not following commands moving all 4 extremities spontaneously Extremities: Without edema Skin: No rashes, lesions or ulcers; small numerous ecchymoses with areas of petechiae noted throughout, stable from admission  Data Reviewed: I have personally reviewed following labs and imaging studies  CBC: Recent Labs  Lab 06/18/22 1217 06/19/22 0416 06/20/22 0400 06/21/22 0630 06/23/22 0638 06/25/22 0518  WBC 2.6* 3.6* 2.6* 2.8* 4.1 4.3  NEUTROABS 1.3*  --   --   --   --   --   HGB 7.7* 8.4* 7.3* 7.3* 7.1* 6.6*  HCT 22.5* 25.5* 21.4* 21.7* 20.9* 18.9*  MCV 87.2 91.1 87.7 89.7 88.2 86.3  PLT 15* 18* 16* 20* 24* 29*     Basic Metabolic Panel: Recent Labs  Lab 06/18/22 1523 06/19/22 0416 06/20/22 0400 06/21/22 0630 06/23/22 0638  06/25/22 0518  NA  --  138 138 139 135 131*  K  --  3.4* 3.2* 4.1 3.7 3.3*  CL  --  107 105 107 101 99  CO2  --  _1 GLUCOSE  --  134* 95 77 109* 90  BUN  --  22* _2 CREATININE  --  0.64 0.49 0.58 0.43* 0.49  CALCIUM  --  7.4* 7.6* 7.7* 7.8* 7.7*  MG 0.8* 1.7 2.1  --   --   --      CBG: Recent Labs  Lab 06/19/22 0317 06/19/22 0446 06/19/22 0723 06/19/22 1111 06/24/22 1615  GLUCAP 126* 123* 120* 112* 98     Recent Results (from the past 240 hour(s))  Blood Culture (routine x 2)     Status: None   Collection Time: 06/18/22 12:17 PM   Specimen: BLOOD RIGHT HAND  Result Value Ref Range Status   Specimen Description BLOOD RIGHT HAND  Final   Special Requests   Final    BOTTLES DRAWN AEROBIC AND ANAEROBIC Blood Culture adequate volume   Culture   Final    NO GROWTH 5 DAYS Performed at [facility], [address] [place], [place] [address]    Report Status 06/23/2022 FINAL  Final  Blood Culture (routine x 2)     Status: None   Collection Time: 06/18/22 12:17 PM   Specimen: Left Antecubital; Blood  Result Value Ref Range Status   Specimen Description LEFT ANTECUBITAL  Final   Special Requests   Final    BOTTLES DRAWN AEROBIC AND ANAEROBIC Blood Culture adequate volume   Culture   Final    NO GROWTH 5 DAYS Performed at [facility], [address] [place], [place] [address]    Report Status 06/23/2022 FINAL  Final  Resp Panel by RT-PCR (Flu A&B, Covid) Anterior Nasal Swab     Status: None   Collection Time: 06/18/22  1:18 PM   Specimen: Anterior Nasal Swab  Result Value Ref Range Status   SARS Coronavirus 2 by RT PCR NEGATIVE NEGATIVE Final    Comment: (NOTE) SARS-CoV-2 target nucleic acids are NOT DETECTED.  The SARS-CoV-2 RNA is generally detectable in upper respiratory specimens during the acute phase of infection. The lowest concentration of SARS-CoV-2 viral copies this assay can detect is 138 copies/mL. A negative result does not  preclude SARS-Cov-2 infection and should not be used as the sole basis for treatment or other patient management decisions. A negative result may occur with  improper specimen collection/handling, submission of specimen other than nasopharyngeal swab, presence of viral mutation(s) within the areas targeted by this assay, and inadequate number of viral copies(<138 copies/mL). A negative result must be combined with clinical observations, patient history, and epidemiological information. The expected result is Negative.  Fact Sheet for Patients:  EntrepreneurPulse.com.au  Fact Sheet for Healthcare Providers:  IncredibleEmployment.be  This test is no t yet approved or cleared by the [place] FDA and  has been authorized for detection and/or diagnosis of SARS-CoV-2 by FDA under an Emergency Use Authorization (EUA). This EUA will remain  in effect (meaning this test can be used) for the duration of the COVID-19 declaration under Section 564(b)(1) of the Act, 21 U.S.C.section 360bbb-3(b)(1), unless the authorization is terminated  or revoked sooner.       Influenza A by PCR NEGATIVE NEGATIVE Final   Influenza B by PCR NEGATIVE NEGATIVE Final    Comment: (NOTE) The Xpert Xpress SARS-CoV-2/FLU/RSV plus assay is intended as an aid in the diagnosis of influenza from Nasopharyngeal swab specimens and should not be used as a sole basis for treatment. Nasal washings and aspirates are unacceptable for Xpert Xpress SARS-CoV-2/FLU/RSV testing.  Fact Sheet for Patients: EntrepreneurPulse.com.au  Fact Sheet for Healthcare Providers: IncredibleEmployment.be  This test is not yet approved or cleared by the [place] FDA and has been authorized for detection and/or diagnosis of SARS-CoV-2 by FDA under an Emergency Use Authorization (EUA). This EUA will remain in effect (meaning this test can be used) for the  duration of the COVID-19 declaration under Section 564(b)(1) of the Act, 21 U.S.C. section 360bbb-3(b)(1), unless the authorization is terminated or revoked.  Performed at [facility], [address], [place], [place] [address]   Urine Culture     Status: None   Collection Time: 06/18/22  1:42 PM   Specimen: In/Out Cath Urine  Result Value Ref Range Status   Specimen Description   Final    IN/OUT CATH URINE Performed at [facility], [address] [place], [place] [address]    Special Requests   Final    NONE Performed at [facility], [address], [place], [place] [address]    Culture   Final    NO GROWTH Performed at [facility], [place] [address] [place], [place] [address]    Report Status 06/19/2022 FINAL  Final  MRSA Next Gen by PCR, Nasal     Status: None   Collection  Time: 06/18/22  5:29 PM   Specimen: Nasal Mucosa; Nasal Swab  Result Value Ref Range Status   MRSA by PCR Next Gen NOT DETECTED NOT DETECTED Final    Comment: (NOTE) The GeneXpert MRSA Assay (FDA approved for NASAL specimens only), is one component of a comprehensive MRSA colonization surveillance program. It is not intended to diagnose MRSA infection nor to guide or monitor treatment for MRSA infections. Test performance is not FDA approved in patients less than 33 years old. Performed at [facility], [address], [place], [place] [address]      Radiology Studies: CT Angio Abd/Pel w/ and/or w/o  Result Date: 06/24/2022 CLINICAL DATA:  56 year old female with history of epigastric discomfort. History of multiple myeloma. * Tracking Code: BO * EXAM: CTA ABDOMEN AND PELVIS WITHOUT AND WITH CONTRAST TECHNIQUE: Multidetector CT imaging of the abdomen and pelvis was performed using the standard protocol during bolus administration of intravenous contrast. Multiplanar reconstructed images and MIPs were obtained and reviewed to evaluate the vascular anatomy. RADIATION DOSE REDUCTION: This exam was  performed according to the departmental dose-optimization program which includes automated exposure control, adjustment of the mA and/or kV according to patient size and/or use of iterative reconstruction technique. CONTRAST:  2m OMNIPAQUE IOHEXOL 350 MG/ML SOLN COMPARISON:  CT of the abdomen and pelvis 04/30/2022. FINDINGS: Comment: Portions of today's examination are limited by considerable patient motion. VASCULAR Aorta: Atherosclerosis throughout the abdominal aorta. Normal caliber aorta without aneurysm, dissection, vasculitis or significant stenosis. Celiac: Patent without evidence of aneurysm, dissection, vasculitis or significant stenosis. SMA: Patent without evidence of aneurysm, dissection, vasculitis or significant stenosis. Renals: Both renal arteries are patent without evidence of aneurysm, dissection, vasculitis, fibromuscular dysplasia or significant stenosis. IMA: Patent without evidence of aneurysm, dissection, vasculitis or significant stenosis. Inflow: Patent without evidence of aneurysm, dissection, vasculitis or significant stenosis. Proximal Outflow: Bilateral common femoral and visualized portions of the superficial and profunda femoral arteries are patent without evidence of aneurysm, dissection, vasculitis or significant stenosis. Veins: No obvious venous abnormality within the limitations of this arterial phase study. Review of the MIP images confirms the above findings. NON-VASCULAR Lower chest: Scattered areas of scarring are noted throughout the lung bases bilaterally. Hepatobiliary: Severe diffuse low attenuation throughout the hepatic parenchyma, indicative of a background of severe hepatic steatosis. There are multiple nodular appearing areas within the liver which appear spared from hepatic steatosis on precontrast images. Assessment for enhancement within these lesions is challenging on today's limited CT examination, and accordingly, these lesions are incompletely characterized,  largest of which is in the right lobe of the liver (axial image 25 of series 11) measuring 1.7 cm in diameter. No intra or extrahepatic biliary ductal dilatation. Gallbladder is moderately distended. Gallbladder wall thickness is normal. No pericholecystic fluid or surrounding inflammatory changes. No calcified gallstones are noted in the lumen of the gallbladder. Pancreas: No pancreatic mass. No pancreatic ductal dilatation. No pancreatic or peripancreatic fluid collections or inflammatory changes. Spleen: Unremarkable. Adrenals/Urinary Tract: 6 mm nonobstructive calculus in the right renal collecting system. No additional calculi are noted within the left renal collecting system, along the course of either ureter, or within the lumen of the urinary bladder. Bilateral kidneys and adrenal glands are normal in appearance. No hydroureteronephrosis. Urinary bladder is nearly decompressed around an indwelling Foley balloon catheter. Small amount of gas non dependently within the lumen of the urinary bladder is iatrogenic. Stomach/Bowel: The appearance of the stomach is normal. No pathologic dilatation of small bowel or  colon. Normal appendix. Lymphatic: No lymphadenopathy noted in the abdomen or pelvis. Reproductive: Status post hysterectomy.  Ovaries are atrophic. Other: No significant volume of ascites.  No pneumoperitoneum. Musculoskeletal: Areas of mixed lucency and sclerosis are noted throughout the visualized axial and appendicular skeleton, likely reflective of numerous osseous lesions from patient's known multiple myeloma. IMPRESSION: VASCULAR 1. No acute vascular abnormality in the abdomen or pelvis. 2. Aortic atherosclerosis. NON-VASCULAR 1. Severe hepatic steatosis. Multiple liver lesions poorly evaluated on today's CT examination, favored to represent areas of focal fatty sparing, however, underlying neoplasm is not excluded. Follow-up evaluation with nonemergent outpatient abdominal MRI with and without IV  gadolinium is recommended in the near future to characterize these lesions and exclude neoplasm. 2. 6 mm nonobstructive calculus in the right renal collecting system. No ureteral stones or findings of urinary tract obstruction. 3. Multiple osseous lesions, presumably reflective of patient's known multiple myeloma. 4. Additional incidental findings, as above. These results will be called to the ordering clinician or representative by the Radiologist Assistant, and communication documented in the PACS or [employer]. Electronically Signed   By: Vinnie Langton M.D.   On: 06/24/2022 07:48    Scheduled Meds:  sodium chloride   Intravenous Once   acyclovir  400 mg Oral BID   allopurinol  300 mg Oral Daily   aspirin  300 mg Rectal Daily   Chlorhexidine Gluconate Cloth  6 each Topical Daily   docusate sodium  100 mg Oral Daily   escitalopram  10 mg Oral Daily   fluticasone  2 spray Each Nare Daily   influenza vac split quadrivalent PF  0.5 mL Intramuscular Tomorrow-1000   melatonin  3 mg Oral QHS   pantoprazole  40 mg Oral Daily   PHENObarbital  65 mg Intravenous BID   phenytoin (DILANTIN) IV  100 mg Intravenous Q8H   pregabalin  200 mg Oral BID   rosuvastatin  20 mg Oral Daily   sodium chloride flush  10-40 mL Intracatheter Q12H   Continuous Infusions:  lacosamide (VIMPAT) IV 100 mg (06/24/22 2230)   lactated ringers 70 mL/hr at 06/25/22 0039   levETIRAcetam 1,500 mg (06/24/22 2128)     LOS: 7 days    Little Ishikawa, DO Contact overnight covering provider during after hours 7P -7A,  06/25/2022, 7:39 AM

## 2022-06-25 NOTE — Procedures (Addendum)
Patient Name: Joanna Reid  MRN: [id number]  Epilepsy Attending: [name]  Referring Physician/Provider: Dr Kerney Elbe  Duration: 06/24/2022 1626 to 06/25/2022 1626   Patient history:  56 y.o. female with a history of seizure who is undergoing an EEG to evaluate for seizures.    Level of alertness: Awake, sleep   AEDs during EEG study: Phenobarb, PHT, PGB, LEV, LCM   Technical aspects: This EEG study was done with scalp electrodes positioned according to the 10-20 International system of electrode placement. Electrical activity was reviewed with band pass filter of 1-'70Hz'$ , sensitivity of 7 uV/mm, display speed of 35m/sec with a '60Hz'$  notched filter applied as appropriate. EEG data were recorded continuously and digitally stored.  Video monitoring was available and reviewed as appropriate.   Description: No clear posterior dominant rhythm was seen. Sleep was characterized by sleep spindles (12 to 14 Hz), maximal frontocentral region.  EEG showed continuous generalized 3 to 6 Hz theta-delta slowing. Hyperventilation and photic stimulation were not performed.     ABNORMALITY - Continuous slow, generalized   IMPRESSION: This study is suggestive of moderate to severe diffuse encephalopathy, nonspecific etiology. No seizures  were seen throughout the recording.    Jacon Whetzel OBarbra Sarks

## 2022-06-25 NOTE — Plan of Care (Signed)
  Problem: Education: Goal: Knowledge of General Education information will improve Description: Including pain rating scale, medication(s)/side effects and non-pharmacologic comfort measures Outcome: Not Progressing   Problem: Activity: Goal: Risk for activity intolerance will decrease Outcome: Not Progressing   Problem: Nutrition: Goal: Adequate nutrition will be maintained Outcome: Not Progressing   Problem: Skin Integrity: Goal: Risk for impaired skin integrity will decrease Outcome: Not Progressing

## 2022-06-25 NOTE — Progress Notes (Signed)
PT Cancellation Note  Patient Details Name: Joanna Reid MRN: [id number] DOB: 06-10-1966   Cancelled Treatment:    Reason Eval/Treat Not Completed: (P) Fatigue/lethargy limiting ability to participate (pt unable to be aroused from sleep with verbal and tactile stimuli, not opening eyes and partially rolling away from this PTA, spouse present and attempting to wake pt as well without sucess.) Will check back as schedule allows to continue with PT POC.  Audry Riles. PTA Acute Rehabilitation Services Office: [place] 06/25/2022, 12:35 PM

## 2022-06-25 NOTE — Progress Notes (Signed)
Date and time results received: 06/25/22 0610 (use smartphrase ".now" to insert current time)  Test: Hemoglobin Critical Value: 6.6  Name of Provider Notified: Dr. Claria Dice  Orders Received? Or Actions Taken?: Orders Received - See Orders for details

## 2022-06-25 NOTE — Progress Notes (Signed)
LTM maint complete - no skin breakdown under: PZ,A2

## 2022-06-26 DIAGNOSIS — R4182 Altered mental status, unspecified: Secondary | ICD-10-CM | POA: Diagnosis not present

## 2022-06-26 DIAGNOSIS — R569 Unspecified convulsions: Secondary | ICD-10-CM | POA: Diagnosis not present

## 2022-06-26 MED ORDER — PHENOBARBITAL SODIUM 65 MG/ML IJ SOLN
65.0000 mg | Freq: Two times a day (BID) | INTRAMUSCULAR | Status: AC
  Administered 2022-06-26 – 2022-06-29 (×7): 65 mg via INTRAVENOUS
  Filled 2022-06-26 (×7): qty 1

## 2022-06-26 MED ORDER — QUETIAPINE FUMARATE 25 MG PO TABS
12.5000 mg | ORAL_TABLET | Freq: Every day | ORAL | Status: DC
  Administered 2022-06-26: 12.5 mg via ORAL
  Filled 2022-06-26: qty 1

## 2022-06-26 MED ORDER — PHENOBARBITAL SODIUM 65 MG/ML IJ SOLN: 32.5000 mg | Freq: Every day | INTRAMUSCULAR | Status: DC

## 2022-06-26 MED ORDER — PHENOBARBITAL SODIUM 65 MG/ML IJ SOLN
65.0000 mg | Freq: Every day | INTRAMUSCULAR | Status: DC
  Administered 2022-06-30: 65 mg via INTRAVENOUS
  Filled 2022-06-26: qty 1

## 2022-06-26 MED ORDER — POTASSIUM CHLORIDE 10 MEQ/100ML IV SOLN
10.0000 meq | INTRAVENOUS | Status: AC
  Administered 2022-06-26 (×2): 10 meq via INTRAVENOUS

## 2022-06-26 NOTE — Progress Notes (Signed)
Subjective: No acute events overnight.  Brother-in-law at bedside states she did not sleep much overnight and therefore is more drowsy.  However when she is more awake, she was able to have more conversation.  ROS: Unable to obtain due to poor mental status  Examination  Vital signs in last 24 hours: Temp:  [97.7 F (36.5 C)-98.1 F (36.7 C)] 98.1 F (36.7 C) (12/19 0800) Pulse Rate:  [83-91] 85 (12/19 0800) Resp:  [15-20] 17 (12/19 0800) BP: (133-168)/(71-95) 147/95 (12/19 0800) SpO2:  [96 %-100 %] 96 % (12/19 0800)  General: lying in bed, not in apparent distress. Neuro: Awake and tracks examiner but keeps falling asleep intermittently, was able to tell me her name, appropriately said she is at the hospital then given 2 choices, able to identify 1 of 2 objects (phone), did not follow commands, antigravity strength in all 4 extremities with right hemiparesis  Basic Metabolic Panel: Recent Labs  Lab 06/20/22 0400 06/21/22 0630 06/23/22 0638 06/25/22 0518  NA 138 139 135 131*  K 3.2* 4.1 3.7 3.3*  CL 105 107 101 99  CO2 _0 GLUCOSE 95 77 109* 90  BUN _1 CREATININE 0.49 0.58 0.43* 0.49  CALCIUM 7.6* 7.7* 7.8* 7.7*  MG 2.1  --   --   --     CBC: Recent Labs  Lab 06/20/22 0400 06/21/22 0630 06/23/22 0638 06/25/22 0518 06/25/22 2153  WBC 2.6* 2.8* 4.1 4.3  --   HGB 7.3* 7.3* 7.1* 6.6* 7.8*  HCT 21.4* 21.7* 20.9* 18.9* 22.1*  MCV 87.7 89.7 88.2 86.3  --   PLT 16* 20* 24* 29*  --    Coagulation Studies: No results for input(s): "LABPROT", "INR" in the last 72 hours.  Imaging No new brain imaging overnight  ASSESSMENT AND PLAN: 56 year old female with history of multiple medical myeloma,thrombocytopenia, left hemispheric stroke with resultant dysphagia and right-sided weakness which is gradually improving who presented with seizures.   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   Recommendations -Stop phenytoin, phenobarb taper ordered -Discontinue LTM EEG so patient can participate in physical therapy. If needed, can obtain another EEG in a few days -Will also start Seroquel 12.5 mg at 1700 to help with agitation and sleep tonight -Continue seizure precautions -Management of rest of comorbidities per primary team -Discussed plan with mother-in-law at bedside and with Dr. Avon Gully via secure chat   I have spent a total of 35 minutes with the patient reviewing hospital notes,  test results, labs and examining the patient as well as establishing an assessment and plan that was discussed personally with the patient's mother-in-law at bedside.  > 50% of time was spent in direct patient care.  Zeb Comfort Epilepsy [facility] For questions after 5pm please refer to AMION to reach the Neurologist on call

## 2022-06-26 NOTE — Progress Notes (Signed)
Physical Therapy Treatment Patient Details Name: Joanna Reid MRN: [id number] DOB: 17-Oct-1965 Today's Date: 06/26/2022   History of Present Illness 56 yo female presents to [facility] on 12/11 with headache and lethargy. + seizures in ED. MRI brain shows small incidental stroke in the right caudate. EEG shows Continuous L lateralized periodic discharges (high risk for seizures). Pt with multiple myeloma currently undergoing treatment last chemo 2 weeks ago.  Other PMH includes hypertension, gout, GERD, type 2 diabetes mellitus, migraine headaches, stroke with residual aphasia    PT Comments    Pt continues to be limited by general weakness, poor activity tolerance, lethargy and impaired cognition. Pt needing max assist to transition supine <>sitting EOB and requires constant hands on assist to maintain sitting balance at EOB with pt demonstrating right lateral lean and with tendency for posterior LOB. Pt able to tolerate ~ 10 mins with waxing/waning of alertness sitting EOB before max fatigue. Pt able to come to propped sitting on L elbow and initiate return to midline x3 trials and follow . Pt family present and supportive throughout session. Current plan remains appropriate to address deficits and maximize functional independence and decrease caregiver burden. Pt continues to benefit from skilled PT services to progress toward functional mobility goals.    Recommendations for follow up therapy are one component of a multi-disciplinary discharge planning process, led by the attending physician.  Recommendations may be updated based on patient status, additional functional criteria and insurance authorization.  Follow Up Recommendations  Acute inpatient rehab (3hours/day)     Assistance Recommended at Discharge Frequent or constant Supervision/Assistance  Patient can return home with the following Two people to help with walking and/or transfers;A lot of help with  bathing/dressing/bathroom;Assistance with cooking/housework;Direct supervision/assist for medications management;Help with stairs or ramp for entrance   Equipment Recommendations  Other (comment) (tbd)    Recommendations for Other Services       Precautions / Restrictions Precautions Precautions: Fall Restrictions Weight Bearing Restrictions: No     Mobility  Bed Mobility Overal bed mobility: Needs Assistance Bed Mobility: Supine to Sit, Sit to Supine     Supine to sit: Max assist, HOB elevated Sit to supine: Max assist, HOB elevated   General bed mobility comments: assist for trunk and LE management, scooting to/from EOB, boost up, and repositioning once in supine    Transfers                   General transfer comment: nt - requiring at least min assist to maintain upright sitting and increasingly fatigued    Ambulation/Gait                   Stairs             Wheelchair Mobility    Modified Rankin (Stroke Patients Only) Modified Rankin (Stroke Patients Only) Pre-Morbid Rankin Score: No significant disability Modified Rankin: Severe disability     Balance Overall balance assessment: Needs assistance Sitting-balance support: Feet supported, Bilateral upper extremity supported Sitting balance-Leahy Scale: Poor Sitting balance - Comments: min-mod truncal assist to maintain upright sitting, able to maintian unsupport ~3 seconds                                    Cognition Arousal/Alertness: Lethargic, Awake/alert Behavior During Therapy: Restless, Flat affect Overall Cognitive Status: Impaired/Different from baseline Area of Impairment: Attention, Following commands, Safety/judgement, Problem solving, Awareness  Current Attention Level: Focused   Following Commands: Follows one step commands inconsistently Safety/Judgement: Decreased awareness of safety, Decreased awareness of  deficits Awareness: Intellectual Problem Solving: Slow processing, Decreased initiation, Difficulty sequencing, Requires verbal cues, Requires tactile cues General Comments: pt qith very flat affect this date with minmal verbal engagement Pt does not consistently respond correctly to yes/no questions        Exercises      General Comments        Pertinent Vitals/Pain Pain Assessment Pain Assessment: No/denies pain Pain Intervention(s): Monitored during session    Home Living                          Prior Function            PT Goals (current goals can now be found in the care plan section) Acute Rehab PT Goals PT Goal Formulation: With patient Time For Goal Achievement: 07/06/22    Frequency    Min 4X/week      PT Plan      Co-evaluation              AM-PAC PT "6 Clicks" Mobility   Outcome Measure  Help needed turning from your back to your side while in a flat bed without using bedrails?: A Lot Help needed moving from lying on your back to sitting on the side of a flat bed without using bedrails?: A Lot Help needed moving to and from a bed to a chair (including a wheelchair)?: Total Help needed standing up from a chair using your arms (e.g., wheelchair or bedside chair)?: Total Help needed to walk in hospital room?: Total Help needed climbing 3-5 steps with a railing? : Total 6 Click Score: 8    End of Session   Activity Tolerance: Patient limited by fatigue Patient left: in bed;with call bell/phone within reach;with bed alarm set;with family/visitor present Nurse Communication: Mobility status PT Visit Diagnosis: Other abnormalities of gait and mobility (R26.89);Muscle weakness (generalized) (M62.81)     Time: 5465-6812 PT Time Calculation (min) (ACUTE ONLY): 18 min  Charges:  $Therapeutic Activity: 8-22 mins                      R. PTA Acute Rehabilitation Services Office: [place] 06/26/2022, 1:38  PM

## 2022-06-26 NOTE — Progress Notes (Signed)
PROGRESS NOTE   Joanna Reid  QHK:[id number] DOB: 13-Jun-1966 DOA: 06/18/2022 PCP: Lindell Spar, MD   Chief Complaint  Patient presents with   Weakness   Level of care: Progressive  Brief Admission History:  56 year old female with refractory multiple myeloma status post relapse, hypertension, gout, GERD, type 2 diabetes mellitus, migraine headaches, stroke with residual aphasia presented to ED for ongoing weakness and lethargy. Last had chemotherapy 2 weeks ago.  She has recently received PRBC and platelets in the cancer center.  While in the ED patient had acute mental status change, unarousable with full tonic-clonic seizure lasting 2 to 3 minutes which improved with lorazepam.  Transferred to [place] for further neuro evaluation and follow-up as well as continuous EEG.   Assessment and Plan:  New Onset Partial Status Epilepticus  Acute metabolic encephalopathy, improving -Neurology following, EEG ongoing -Continue Keppra Dilantin and Vimpat, added phenobarbital given ongoing mental status changes concerning for subclinical seizures.  Benzos as needed for breakthrough seizures.   -Mental status minimally improving over the past 48 hours -previously unable to answer simple questions; today she is more interactive and occasionally verbal and appropriate with words but sluggish to respond -Neuro to follow AED levels - weaning meds as possible phenytoin stopped today - phenobarbital taper to start -No further indication for burst suppression protocol/ICU transfer at this time  Abdominal pain - secondary to nephrolithiasis(nonobstructive) Incidental liver lesions noted -Patient indicates her previous flank pain is consistent with her history of nephrolithiasis -CTA notable for nonobstructing nephrolithasis -per husband these are somewhat chronic -Continue IV fluids and Flomax until p.o. intake is appropriate -Incidentally noted - multiple liver lesions -will require  MRI abdomen in the near future -no need for urgent/emergent imaging at this time - Liver panel shows minimally elevated alk phos; ammonia negative  Cerebrovascular Disease Acute CVA in right caudate  -Continue aspirin and statin -Neurology following as above -TTE mild grade 1 diastolic dysfunction, bubble study negative -A1c 7.2, TSH 2.0 - permissive hypertension x 48 hours - increase meds as necessary - SLP/PT/OT evaluation ongoing, advance diet as tolerated, patient somewhat more awake this morning, shaking her head yes or no seemingly appropriately to questions but then speaks somewhat incoherently.  Following simple commands.  Severe Hypomagnesemia Hypokalemia -Magnesium 0.8 at intake, repleted, repeat 2.1 -Potassium 3.2 add 40 mEq IV x 1  Refractory Multiple Myeloma  - Followed by Dr. Delton Coombes - Last chemo given 2 weeks prior to admit  Pancytopenia  - Secondary to above - s/p 2 unit PRBC since admission - Hemoglobin trending downward slowly likely secondary to multiple blood draws - limit to q48h unless otherwise indicated - Platelets improving minimally, 29 today; trough of 11 last week - consider platelet transfusion if bleeding or worsening labs - WBC improving to 4.3  DVT prophylaxis: SCD only given above thrombocytopenia, remains high risk for bleeding Code Status: Full  Family Communication: Mother updated at bedside Disposition: Status is: Inpatient Remains inpatient appropriate given above ongoing need for evaluation, EEG IV medications   Consultants:  Neurology    Subjective: No acute issues or events overnight, somnolent but easily arousable, able to orient to place person and surroundings.  Denies nausea vomiting diarrhea constipation any fevers chills or chest pain.  Objective: Vitals:   06/25/22 1809 06/25/22 2008 06/26/22 0038 06/26/22 0338  BP: (!) 168/93 133/83 (!) 154/71 (!) 143/92  Pulse: 91 87 84 83  Resp: _0 Temp: 98 F (36.7  C) 98.1  F (36.7 C) 98 F (36.7 C) 98.1 F (36.7 C)  TempSrc: Axillary Oral Oral Oral  SpO2: 97% 99% 100% 99%  Weight:      Height:        Intake/Output Summary (Last 24 hours) at 06/26/2022 0735 Last data filed at 06/26/2022 0342 Gross per 24 hour  Intake 375 ml  Output 1150 ml  Net -775 ml    Filed Weights   06/18/22 1128 06/18/22 1728 06/19/22 0500  Weight: 107 kg 109.3 kg 109.4 kg   Examination:  General exam: Awake and alert this morning more so than previous, follow simple commands, speaking incoherently but nods head yes or no to questions appropriately Respiratory system: Clear to auscultation. Respiratory effort normal. Cardiovascular system: normal S1 & S2 heard. No JVD, murmurs, rubs, gallops or clicks. 2+ pedal edema. Gastrointestinal system: Abdomen is nondistended, soft and nontender. No organomegaly or masses felt. Normal bowel sounds heard. Central nervous system: Somnolent but easily arousable, not following commands moving all 4 extremities spontaneously Extremities: Without edema Skin: No rashes, lesions or ulcers; small numerous ecchymoses with areas of petechiae noted throughout, stable from admission  Data Reviewed: I have personally reviewed following labs and imaging studies  CBC: Recent Labs  Lab 06/20/22 0400 06/21/22 0630 06/23/22 0638 06/25/22 0518 06/25/22 2153  WBC 2.6* 2.8* 4.1 4.3  --   HGB 7.3* 7.3* 7.1* 6.6* 7.8*  HCT 21.4* 21.7* 20.9* 18.9* 22.1*  MCV 87.7 89.7 88.2 86.3  --   PLT 16* 20* 24* 29*  --      Basic Metabolic Panel: Recent Labs  Lab 06/20/22 0400 06/21/22 0630 06/23/22 0638 06/25/22 0518  NA 138 139 135 131*  K 3.2* 4.1 3.7 3.3*  CL 105 107 101 99  CO2 _0 GLUCOSE 95 77 109* 90  BUN _1 CREATININE 0.49 0.58 0.43* 0.49  CALCIUM 7.6* 7.7* 7.8* 7.7*  MG 2.1  --   --   --      CBG: Recent Labs  Lab 06/19/22 1111 06/24/22 1615 06/25/22 0804  GLUCAP 112* 98 86     Recent Results (from the  past 240 hour(s))  Blood Culture (routine x 2)     Status: None   Collection Time: 06/18/22 12:17 PM   Specimen: BLOOD RIGHT HAND  Result Value Ref Range Status   Specimen Description BLOOD RIGHT HAND  Final   Special Requests   Final    BOTTLES DRAWN AEROBIC AND ANAEROBIC Blood Culture adequate volume   Culture   Final    NO GROWTH 5 DAYS Performed at [facility], [address], [place], [place] [address]    Report Status 06/23/2022 FINAL  Final  Blood Culture (routine x 2)     Status: None   Collection Time: 06/18/22 12:17 PM   Specimen: Left Antecubital; Blood  Result Value Ref Range Status   Specimen Description LEFT ANTECUBITAL  Final   Special Requests   Final    BOTTLES DRAWN AEROBIC AND ANAEROBIC Blood Culture adequate volume   Culture   Final    NO GROWTH 5 DAYS Performed at [facility], [address], [place], [place] [address]    Report Status 06/23/2022 FINAL  Final  Resp Panel by RT-PCR (Flu A&B, Covid) Anterior Nasal Swab     Status: None   Collection Time: 06/18/22  1:18 PM   Specimen: Anterior Nasal Swab  Result Value Ref Range Status  SARS Coronavirus 2 by RT PCR NEGATIVE NEGATIVE Final    Comment: (NOTE) SARS-CoV-2 target nucleic acids are NOT DETECTED.  The SARS-CoV-2 RNA is generally detectable in upper respiratory specimens during the acute phase of infection. The lowest concentration of SARS-CoV-2 viral copies this assay can detect is 138 copies/mL. A negative result does not preclude SARS-Cov-2 infection and should not be used as the sole basis for treatment or other patient management decisions. A negative result may occur with  improper specimen collection/handling, submission of specimen other than nasopharyngeal swab, presence of viral mutation(s) within the areas targeted by this assay, and inadequate number of viral copies(<138 copies/mL). A negative result must be combined with clinical observations, patient history, and  epidemiological information. The expected result is Negative.  Fact Sheet for Patients:  EntrepreneurPulse.com.au  Fact Sheet for Healthcare Providers:  IncredibleEmployment.be  This test is no t yet approved or cleared by the [place] FDA and  has been authorized for detection and/or diagnosis of SARS-CoV-2 by FDA under an Emergency Use Authorization (EUA). This EUA will remain  in effect (meaning this test can be used) for the duration of the COVID-19 declaration under Section 564(b)(1) of the Act, 21 U.S.C.section 360bbb-3(b)(1), unless the authorization is terminated  or revoked sooner.       Influenza A by PCR NEGATIVE NEGATIVE Final   Influenza B by PCR NEGATIVE NEGATIVE Final    Comment: (NOTE) The Xpert Xpress SARS-CoV-2/FLU/RSV plus assay is intended as an aid in the diagnosis of influenza from Nasopharyngeal swab specimens and should not be used as a sole basis for treatment. Nasal washings and aspirates are unacceptable for Xpert Xpress SARS-CoV-2/FLU/RSV testing.  Fact Sheet for Patients: EntrepreneurPulse.com.au  Fact Sheet for Healthcare Providers: IncredibleEmployment.be  This test is not yet approved or cleared by the [place] FDA and has been authorized for detection and/or diagnosis of SARS-CoV-2 by FDA under an Emergency Use Authorization (EUA). This EUA will remain in effect (meaning this test can be used) for the duration of the COVID-19 declaration under Section 564(b)(1) of the Act, 21 U.S.C. section 360bbb-3(b)(1), unless the authorization is terminated or revoked.  Performed at [facility], [address] [place], [place] [address]   Urine Culture     Status: None   Collection Time: 06/18/22  1:42 PM   Specimen: In/Out Cath Urine  Result Value Ref Range Status   Specimen Description   Final    IN/OUT CATH URINE Performed at [facility], [address]  [place], [place] [address]    Special Requests   Final    NONE Performed at [facility], [address] [place], [place] [address]    Culture   Final    NO GROWTH Performed at [facility], [place] [address], [place], [place] [address]    Report Status 06/19/2022 FINAL  Final  MRSA Next Gen by PCR, Nasal     Status: None   Collection Time: 06/18/22  5:29 PM   Specimen: Nasal Mucosa; Nasal Swab  Result Value Ref Range Status   MRSA by PCR Next Gen NOT DETECTED NOT DETECTED Final    Comment: (NOTE) The GeneXpert MRSA Assay (FDA approved for NASAL specimens only), is one component of a comprehensive MRSA colonization surveillance program. It is not intended to diagnose MRSA infection nor to guide or monitor treatment for MRSA infections. Test performance is not FDA approved in patients less than 96 years old. Performed at [facility], [place]  [address], [place], [place] [address]      Radiology Studies: No results found.  Scheduled Meds:  acyclovir  400 mg Oral BID   allopurinol  300 mg Oral Daily   aspirin  300 mg Rectal Daily   Chlorhexidine Gluconate Cloth  6 each Topical Daily   docusate sodium  100 mg Oral Daily   escitalopram  10 mg Oral Daily   fluticasone  2 spray Each Nare Daily   influenza vac split quadrivalent PF  0.5 mL Intramuscular Tomorrow-1000   melatonin  3 mg Oral QHS   pantoprazole  40 mg Oral Daily   PHENObarbital  65 mg Intravenous BID   phenytoin (DILANTIN) IV  100 mg Intravenous Q8H   pregabalin  200 mg Oral BID   rosuvastatin  20 mg Oral Daily   sodium chloride flush  10-40 mL Intracatheter Q12H   tamsulosin  0.4 mg Oral Daily   Continuous Infusions:  lacosamide (VIMPAT) IV 100 mg (06/25/22 2305)   lactated ringers 70 mL/hr at 06/25/22 1124   levETIRAcetam 1,500 mg (06/25/22 2129)     LOS: 8 days    Little Ishikawa, DO Contact overnight covering provider during after hours 7P -7A,  06/26/2022, 7:35 AM

## 2022-06-26 NOTE — Progress Notes (Signed)
LTM EEG discontinued - skin breakdown at [facility]. FP2,T7, A2. RN aware

## 2022-06-26 NOTE — Plan of Care (Signed)
  Problem: Education: Goal: Knowledge of General Education information will improve Description: Including pain rating scale, medication(s)/side effects and non-pharmacologic comfort measures Outcome: Not Progressing   Problem: Health Behavior/Discharge Planning: Goal: Ability to manage health-related needs will improve Outcome: Not Progressing   Problem: Nutrition: Goal: Adequate nutrition will be maintained Outcome: Not Progressing

## 2022-06-26 NOTE — Progress Notes (Signed)
Inpatient Rehab Admissions Coordinator:    CIR following at a distance. Not currently able to participate with therapies, so not ready. Depending on progress, my need SNF vs CIR.   Clemens Catholic, [place], [place] Admissions Coordinator  [phone] ([place]) [phone] (office)

## 2022-06-26 NOTE — Procedures (Addendum)
Patient Name: Joanna Reid  MRN: [id number]  Epilepsy Attending: [name]  Referring Physician/Provider: Dr Kerney Elbe  Duration: 06/25/2022 1626 to 06/26/2022 1054   Patient history:  56 y.o. female with a history of seizure who is undergoing an EEG to evaluate for seizures.    Level of alertness: Awake, sleep   AEDs during EEG study: Phenobarb, PGB, LEV, LCM   Technical aspects: This EEG study was done with scalp electrodes positioned according to the 10-20 International system of electrode placement. Electrical activity was reviewed with band pass filter of 1-'70Hz'$ , sensitivity of 7 uV/mm, display speed of 67m/sec with a '60Hz'$  notched filter applied as appropriate. EEG data were recorded continuously and digitally stored.  Video monitoring was available and reviewed as appropriate.   Description: No clear posterior dominant rhythm was seen. Sleep was characterized by sleep spindles (12 to 14 Hz), maximal frontocentral region. EEG showed continuous generalized 3 to 6 Hz theta-delta slowing. Hyperventilation and photic stimulation were not performed.     ABNORMALITY - Continuous slow, generalized   IMPRESSION: This study is suggestive of moderate to severe diffuse encephalopathy, nonspecific etiology. No seizures  were seen throughout the recording.    [name] OBarbra Sarks

## 2022-06-27 ENCOUNTER — Other Ambulatory Visit: Payer: BC Managed Care – PPO

## 2022-06-27 DIAGNOSIS — R569 Unspecified convulsions: Secondary | ICD-10-CM | POA: Diagnosis not present

## 2022-06-27 DIAGNOSIS — R531 Weakness: Secondary | ICD-10-CM | POA: Diagnosis not present

## 2022-06-27 LAB — CBC
HCT: 19.2 % — ABNORMAL LOW (ref 36.0–46.0)
Hemoglobin: 6.9 g/dL — CL (ref 12.0–15.0)
MCH: 30.9 pg (ref 26.0–34.0)
MCHC: 35.9 g/dL (ref 30.0–36.0)
MCV: 86.1 fL (ref 80.0–100.0)
Platelets: 36 10*3/uL — ABNORMAL LOW (ref 150–400)
RBC: 2.23 MIL/uL — ABNORMAL LOW (ref 3.87–5.11)
RDW: 18.3 % — ABNORMAL HIGH (ref 11.5–15.5)
WBC: 4.4 10*3/uL (ref 4.0–10.5)
nRBC: 0.7 % — ABNORMAL HIGH (ref 0.0–0.2)

## 2022-06-27 LAB — BASIC METABOLIC PANEL
Anion gap: 8 (ref 5–15)
BUN: 7 mg/dL (ref 6–20)
CO2: 22 mmol/L (ref 22–32)
Calcium: 7.2 mg/dL — ABNORMAL LOW (ref 8.9–10.3)
Chloride: 103 mmol/L (ref 98–111)
Creatinine, Ser: 0.32 mg/dL — ABNORMAL LOW (ref 0.44–1.00)
GFR, Estimated: >60 mL/min (ref >60)
Glucose, Bld: 80 mg/dL (ref 70–99)
Potassium: 3.4 mmol/L — ABNORMAL LOW (ref 3.5–5.1)
Sodium: 133 mmol/L — ABNORMAL LOW (ref 135–145)

## 2022-06-27 LAB — PREPARE RBC (CROSSMATCH)

## 2022-06-27 MED ORDER — SODIUM CHLORIDE 0.9% IV SOLUTION: Freq: Once | INTRAVENOUS | Status: AC

## 2022-06-27 NOTE — Progress Notes (Signed)
Subjective: No acute events overnight.  Per aunt at bedside patient had good night sleep and was less agitated.  However, patient appears to be very drowsy this morning.  ROS: Unable to obtain due to poor mental status  Examination  Vital signs in last 24 hours: Temp:  [97.5 F (36.4 C)-99.5 F (37.5 C)] 97.9 F (36.6 C) (12/20 0915) Pulse Rate:  [88-93] 93 (12/20 0900) Resp:  [16-18] 18 (12/20 0900) BP: (102-135)/(68-84) 116/80 (12/20 0915) SpO2:  [98 %-100 %] 98 % (12/20 0915)  General: lying in bed, not in apparent distress. Neuro: Awake and tracks examiner but keeps falling asleep intermittently, was able to tell me her name but then perseverates on her name, able to tell me that she has 1 daughter who is "very big", attempted to follow 1 command (open her mouth but did not stick out the tongue), did appropriately say yes or no to questions, antigravity strength in all 4 extremities with right hemiparesis  Basic Metabolic Panel: Recent Labs  Lab 06/21/22 0630 06/23/22 0638 06/25/22 0518 06/27/22 0615  NA 139 135 131* 133*  K 4.1 3.7 3.3* 3.4*  CL 107 101 99 103  CO2 _0 GLUCOSE 77 109* 90 80  BUN _1 CREATININE 0.58 0.43* 0.49 0.32*  CALCIUM 7.7* 7.8* 7.7* 7.2*    CBC: Recent Labs  Lab 06/21/22 0630 06/23/22 0638 06/25/22 0518 06/25/22 2153 06/27/22 0615  WBC 2.8* 4.1 4.3  --  4.4  HGB 7.3* 7.1* 6.6* 7.8* 6.9*  HCT 21.7* 20.9* 18.9* 22.1* 19.2*  MCV 89.7 88.2 86.3  --  86.1  PLT 20* 24* 29*  --  36*     Coagulation Studies: No results for input(s): "LABPROT", "INR" in the last 72 hours.  Imaging No new brain imaging overnight   ASSESSMENT AND PLAN: 56 year old female with history of multiple medical myeloma,thrombocytopenia, left hemispheric stroke with resultant dysphagia and right-sided weakness which is gradually improving who presented with seizures.   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   Recommendations -Will stop Seroquel today -Continue Keppra 1500 mg twice daily, Vimpat 100 mg twice daily, pregabalin 200 mg twice daily and phenobarb taper -Continue seizure precautions -Continue PT/OT, speech therapy -Management of rest of comorbidities per primary team -Discussed plan with aunt at bedside   I have spent a total of 36 minutes with the patient reviewing hospital notes,  test results, labs and examining the patient as well as establishing an assessment and plan that was discussed personally with the patient's aunt at bedside.  > 50% of time was spent in direct patient care.  Zeb Comfort Epilepsy [facility] For questions after 5pm please refer to AMION to reach the Neurologist on call

## 2022-06-27 NOTE — Progress Notes (Signed)
Inpatient Rehab Admissions Coordinator:   CIR following at a distance but Pt. Is currently not at a level to tolerate intensity of CIR. May need SNF if tolerance does not improve.  Clemens Catholic, [place], [place] Admissions Coordinator  [phone] ([place]) [phone] (office)

## 2022-06-27 NOTE — Progress Notes (Signed)
Physical Therapy Treatment Patient Details Name: Joanna Reid MRN: [id number] DOB: Nov 22, 1965 Today's Date: 06/27/2022   History of Present Illness 56 yo female presents to [facility] on 12/11 with headache and lethargy. + seizures in ED. MRI brain shows small incidental stroke in the right caudate. EEG shows Continuous L lateralized periodic discharges (high risk for seizures). Pt with multiple myeloma currently undergoing treatment last chemo 2 weeks ago.  Other PMH includes hypertension, gout, GERD, type 2 diabetes mellitus, migraine headaches, stroke with residual aphasia    PT Comments    Pt is progressing slowly with goals. Guarded discharge to AIR if pt is able to increase tolerance to activity will benefit due to age, PLOF. Pt is demonstrating a very flat effect and intermittently will answer to family and staff for mobility and participation in skilled physical therapy. Pt was able to sit EOB initially at Mod A due to poor kinesthetic postural awareness that improved with multi modal cueing for hand placement and body positioning to SBA. Pt was able to hold position and correct until she started to fatigue. Pt has some mobility noted in the L ankle with Ankle DF/PF for AA/ROM with no noted movement in the R ankle. Very minimal movement noted in the L quad with AA/ROM knee extension with good L knee flexion at 3-/5. No noted strength at R knee with assistance. Pt will benefit from skilled physical therapy services in AIR on discharge from acute care hospital setting pending pt ability to participate and increase endurance. Pt HR/O2 sats remained WNL throughout session without any significant deviations.    Recommendations for follow up therapy are one component of a multi-disciplinary discharge planning process, led by the attending physician.  Recommendations may be updated based on patient status, additional functional criteria and insurance authorization.  Follow Up Recommendations  Acute  inpatient rehab (3hours/day)     Assistance Recommended at Discharge Frequent or constant Supervision/Assistance  Patient can return home with the following Two people to help with walking and/or transfers;A lot of help with bathing/dressing/bathroom;Assistance with cooking/housework;Direct supervision/assist for medications management;Help with stairs or ramp for entrance   Equipment Recommendations  Other (comment) (defer to post acute)    Recommendations for Other Services       Precautions / Restrictions Precautions Precautions: Fall Restrictions Weight Bearing Restrictions: No     Mobility  Bed Mobility Overal bed mobility: Needs Assistance Bed Mobility: Supine to Sit, Sit to Supine, Rolling Rolling: Max assist   Supine to sit: Max assist Sit to supine: Max assist   General bed mobility comments: assist for trunk and LE management, scooting to/from EOB, boost up, and repositioning once in supine; pt is able to assist with boosting up with verbal/tactile cues for hand placement/foot placement Patient Response: Cooperative, Flat affect  Transfers     General transfer comment: Due to current postural proprioceptive control pt is unsafe to transfer without 2 person assist or lift.        Modified Rankin (Stroke Patients Only) Modified Rankin (Stroke Patients Only) Pre-Morbid Rankin Score: No significant disability Modified Rankin: Severe disability     Balance Overall balance assessment: Needs assistance Sitting-balance support: Feet supported, Single extremity supported Sitting balance-Leahy Scale: Fair Sitting balance - Comments: Initially pt requires Mod to Min trunk assist but was able to improve to close SBA/CGA with verbal cues when pt would start to lean posteriorly or to the L Postural control: Posterior lean, Left lateral lean  Cognition Arousal/Alertness: Lethargic, Awake/alert Behavior During Therapy: Flat affect Overall Cognitive  Status: Impaired/Different from baseline Area of Impairment: Attention, Following commands, Safety/judgement, Problem solving, Awareness       Following Commands: Follows one step commands inconsistently Safety/Judgement: Decreased awareness of safety, Decreased awareness of deficits     General Comments: Pt has very flat effect and answers questions only ~25% of the time with yes and no. Occasionally will speak up with input.        Exercises General Exercises - Lower Extremity Ankle Circles/Pumps: AAROM, 5 reps, Both Long Arc Quad: AAROM, Both, 5 reps        Pertinent Vitals/Pain Pain Assessment Pain Assessment: No/denies pain     PT Goals (current goals can now be found in the care plan section) Acute Rehab PT Goals PT Goal Formulation: With patient Time For Goal Achievement: 07/06/22 Potential to Achieve Goals: Good Progress towards PT goals: Progressing toward goals    Frequency    Min 4X/week      PT Plan Current plan remains appropriate       AM-PAC PT "6 Clicks" Mobility   Outcome Measure  Help needed turning from your back to your side while in a flat bed without using bedrails?: A Lot Help needed moving from lying on your back to sitting on the side of a flat bed without using bedrails?: A Lot Help needed moving to and from a bed to a chair (including a wheelchair)?: Total Help needed standing up from a chair using your arms (e.g., wheelchair or bedside chair)?: Total Help needed to walk in hospital room?: Total Help needed climbing 3-5 steps with a railing? : Total 6 Click Score: 8    End of Session   Activity Tolerance: Patient limited by fatigue Patient left: in bed;with call bell/phone within reach;with bed alarm set;with family/visitor present Nurse Communication: Mobility status PT Visit Diagnosis: Other abnormalities of gait and mobility (R26.89);Muscle weakness (generalized) (M62.81)     Time: 1555-1620 PT Time Calculation (min)  (ACUTE ONLY): 25 min  Charges:  $Therapeutic Activity: 23-37 mins                     , DPT, CLT  Acute Rehabilitation Services Office: [phone] (Secure chat preferred)     H  06/27/2022, 4:43 PM  

## 2022-06-27 NOTE — Progress Notes (Signed)
CM met with patients spouse this am. He prefers CIR stay but on board with SNF rehab if pt wont qualify/ tolerate CIR. He would prefer a SNF in [facility].  TOC following.

## 2022-06-27 NOTE — Progress Notes (Signed)
PROGRESS NOTE  Joanna Reid KHT:[id number] DOB: [birth year] DOA: 06/18/2022 PCP: Lindell Spar, MD   LOS: 9 days   Brief Narrative / Interim history: 56 year old female with refractory multiple myeloma status post relapse, hypertension, gout, GERD, type 2 diabetes mellitus, migraine headaches, stroke with residual aphasia presented to ED for ongoing weakness and lethargy. Last had chemotherapy 2 weeks ago. She has recently received PRBC and platelets in the cancer center. While in the ED patient had acute mental status change, unarousable with full tonic-clonic seizure lasting 2 to 3 minutes which improved with lorazepam. Transferred to [facility] for further neuro evaluation and follow-up as well as continuous EEG.   Subjective / 24h Interval events: Doing well.  Appears sleepy.  Husband and aunt are at bedside.  Assesement and Plan: Principal Problem:   Generalized weakness Active Problems:   Multiple myeloma (HCC)   Hyperlipidemia   Acute on chronic anemia   Leukopenia   Stroke (cerebrum) -Left parietal lobe, 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    Chronic pain disorder   LVH (left ventricular hypertrophy)   Morbid obesity with BMI of 45.0-49.9, adult (HCC)   Drug-induced polyneuropathy (HCC)   History of CVA with residual deficit   Overactive bladder   Subclinical hypothyroidism   Hypokalemia   Grand mal seizure ([place])   Hypomagnesemia   Status epilepticus (HCC)   Principal problem New Onset Partial Status Epilepticus in the setting of underlying CVA, acute metabolic encephalopathy -neurology consulted and following.  MRI on admission showed a 2 mm acute white matter infarct lateral to the right caudate head, and also tiny subacute infarcts in the anterior right corona radiata and left occipital lobe white matter.  EEG was concerning for possible seizures.  Currently she is on phenobarbital (which is tapered down), Vimpat and Keppra.  She was on  phenytoin which was stopped 12/19. -Continue aspirin and statin for the stroke.  Workup with a 2D echo showed normal EF 65 to 53%, grade 1 diastolic dysfunction, bubble study was negative, A1c 7.2.  Therapy is recommended CIR  Active problems Abdominal pain - secondary to nephrolithiasis(nonobstructive) Incidental liver lesions noted -Patient indicates her previous flank pain is consistent with her history of nephrolithiasis, CTA notable for nonobstructing nephrolithasis -per husband these are somewhat chronic. Continue IV fluids and Flomax until p.o. intake is appropriate.  Abdominal imaging incidentally showed multiple liver lesions, which will require an MRI as an outpatient.  Hypomagnesemia, hypokalemia -continue to monitor and replete as indicated   Refractory Multiple Myeloma - Followed by Dr. Delton Coombes. Last chemo given 2 weeks prior to admit   Pancytopenia - Secondary to myeloma, platelets gradually improving.  Received a total of 3 units of packed red blood cells, last one 12/20  Scheduled Meds:  acyclovir  400 mg Oral BID   allopurinol  300 mg Oral Daily   aspirin  300 mg Rectal Daily   Chlorhexidine Gluconate Cloth  6 each Topical Daily   docusate sodium  100 mg Oral Daily   escitalopram  10 mg Oral Daily   fluticasone  2 spray Each Nare Daily   influenza vac split quadrivalent PF  0.5 mL Intramuscular Tomorrow-1000   melatonin  3 mg Oral QHS   pantoprazole  40 mg Oral Daily   PHENObarbital  65 mg Intravenous BID   Followed by   Derrill Memo ON 06/30/2022] PHENObarbital  65 mg Intravenous Daily   Followed by   [START ON 07/04/2022] PHENObarbital  32.5  mg Intravenous Daily   pregabalin  200 mg Oral BID   QUEtiapine  12.5 mg Oral Daily   rosuvastatin  20 mg Oral Daily   sodium chloride flush  10-40 mL Intracatheter Q12H   tamsulosin  0.4 mg Oral Daily   Continuous Infusions:  lacosamide (VIMPAT) IV 100 mg (06/27/22 1158)   lactated ringers 70 mL/hr at 06/25/22 1124    levETIRAcetam 1,500 mg (06/27/22 1141)   PRN Meds:.acetaminophen **OR** acetaminophen, albuterol, bisacodyl, diclofenac Sodium, fentaNYL (SUBLIMAZE) injection, LORazepam, ondansetron **OR** ondansetron (ZOFRAN) IV, oxyCODONE, polyethylene glycol, polyvinyl alcohol, sodium chloride flush  Current Outpatient Medications  Medication Instructions   acyclovir (ZOVIRAX) 400 MG tablet TAKE (1) TABLET BY MOUTH TWICE DAILY.   albuterol (VENTOLIN HFA) 108 (90 Base) MCG/ACT inhaler 2 puffs, Inhalation, Every 4 hours PRN   allopurinol (ZYLOPRIM) 300 MG tablet TAKE (1) TABLET BY MOUTH ONCE DAILY.   amLODipine (NORVASC) 2.5 mg, Oral, Daily   aspirin EC 81 mg, Oral, Daily with breakfast, For stroke prophylaxis   BD PEN NEEDLE NANO U/F 32G X 4 MM MISC Subcutaneous   bumetanide (BUMEX) 0.5 mg, Oral, Daily   carvedilol (COREG) 25 mg, Oral, 2 times daily   dexamethasone (DECADRON) 20 mg, Oral, 2 times weekly, Take on day 1 and day 3 along with Xpovio.   diclofenac Sodium (VOLTAREN) 2 g, Topical, 4 times daily   docusate sodium (COLACE) 100 mg, Oral, Daily   doxycycline (VIBRAMYCIN) 100 mg, Oral, 2 times daily   Dulaglutide 1.5 mg, Subcutaneous, Every 7 days   escitalopram (LEXAPRO) 10 MG tablet TAKE (1) TABLET BY MOUTH ONCE DAILY. MAY START WITH 1/2 TABLET FOR 7 DAYS.   fluticasone (FLONASE) 50 MCG/ACT nasal spray INSTILL 2 SPRAYS INTO BOTH NOSTRILS DAILY   Insulin Lispro w/ Trans Port 20 Units, Subcutaneous, See admin instructions, Inject before meals per sliding scale Max TDD 20 units   Lactulose 20 g, Oral, Daily PRN   lidocaine-prilocaine (EMLA) cream 1 Application, Topical, As needed   LORazepam (ATIVAN) 0.5 MG tablet TAKE (1) TABLET BY MOUTH EVERY EIGHT HOURS   losartan (COZAAR) 100 mg, Oral, Daily   magnesium oxide (MAG-OX) 400 mg, Oral, 3 times daily   metFORMIN (GLUCOPHAGE) 500 mg, Oral, 2 times daily   metoCLOPramide (REGLAN) 10 mg, Oral, Every 8 hours PRN   nitrofurantoin,  macrocrystal-monohydrate, (MACROBID) 100 MG capsule TAKE (1) CAPSULE BY MOUTH AT BEDTIME.   oxyCODONE-acetaminophen (PERCOCET/ROXICET) 5-325 MG tablet 1 tablet, Oral, 2 times daily PRN   pantoprazole (PROTONIX) 40 MG tablet TAKE (1) TABLET BY MOUTH ONCE DAILY.   polyethylene glycol (MIRALAX / GLYCOLAX) 17 g, Oral, Daily PRN   pregabalin (LYRICA) 200 MG capsule TAKE (1) CAPSULE BY MOUTH TWICE DAILY.   Propylene Glycol (SYSTANE BALANCE) 0.6 % SOLN 1 drop, Ophthalmic, Daily PRN   rosuvastatin (CRESTOR) 5 mg, Oral, Daily   scopolamine (TRANSDERM-SCOP) 1.5 mg, Transdermal, every 72 hours   selinexor (XPOVIO) 80 mg, Oral, 2 times weekly, Take on days 1 and 3 of each week.   traMADol (ULTRAM) 50 mg, Oral, Every 8 hours PRN    Diet Orders (From admission, onward)     Start     Ordered   06/21/22 1202  DIET - DYS 1 Room service appropriate? Yes; Fluid consistency: Thin  Diet effective now       Comments: No applesauce please  Question Answer Comment  Room service appropriate? Yes   Fluid consistency: Thin      06/21/22 1201  DVT prophylaxis: SCDs Start: 06/18/22 1513   Lab Results  Component Value Date   PLT 36 (L) 06/27/2022      Code Status: Full Code  Family Communication: Husband at bedside  Status is: Inpatient  Remains inpatient appropriate because: severity of illness  Level of care: Progressive  Consultants:  Neurology   Objective: Vitals:   06/27/22 0900 06/27/22 0915 06/27/22 1118 06/27/22 1153  BP: 113/84 116/80 133/75 136/89  Pulse: 93  86   Resp: 18  (!) 21   Temp: 97.9 F (36.6 C) 97.9 F (36.6 C) 99.5 F (37.5 C) 98.7 F (37.1 C)  TempSrc: Axillary Axillary Oral Oral  SpO2:  98% 98% 98%  Weight:      Height:        Intake/Output Summary (Last 24 hours) at 06/27/2022 1215 Last data filed at 06/26/2022 2300 Gross per 24 hour  Intake 10 ml  Output --  Net 10 ml   Wt Readings from Last 3 Encounters:  06/19/22 109.4 kg  06/08/22  107.5 kg  05/17/22 109.5 kg    Examination:  Constitutional: NAD Eyes: no scleral icterus ENMT: Mucous membranes are moist.  Neck: normal, supple Respiratory: clear to auscultation bilaterally, no wheezing, no crackles. Normal respiratory effort. No accessory muscle use.  Cardiovascular: Regular rate and rhythm, no murmurs / rubs / gallops. No LE edema.  Abdomen: non distended, no tenderness. Bowel sounds positive.  Musculoskeletal: no clubbing / cyanosis.  Skin: no rashes Neurologic: non focal   Data Reviewed: I have independently reviewed following labs and imaging studies   CBC Recent Labs  Lab 06/21/22 0630 06/23/22 0638 06/25/22 0518 06/25/22 2153 06/27/22 0615  WBC 2.8* 4.1 4.3  --  4.4  HGB 7.3* 7.1* 6.6* 7.8* 6.9*  HCT 21.7* 20.9* 18.9* 22.1* 19.2*  PLT 20* 24* 29*  --  36*  MCV 89.7 88.2 86.3  --  86.1  MCH 30.2 30.0 30.1  --  30.9  MCHC 33.6 34.0 34.9  --  35.9  RDW 18.5* 18.4* 18.5*  --  18.3*    Recent Labs  Lab 06/21/22 0630 06/23/22 0638 06/24/22 0915 06/25/22 0518 06/27/22 0615  NA 139 135  --  131* 133*  K 4.1 3.7  --  3.3* 3.4*  CL 107 101  --  99 103  CO2 24 24  --  25 22  GLUCOSE 77 109*  --  90 80  BUN 16 13  --  8 7  CREATININE 0.58 0.43*  --  0.49 0.32*  CALCIUM 7.7* 7.8*  --  7.7* 7.2*  AST  --   --  23  --   --   ALT  --   --  15  --   --   ALKPHOS  --   --  197*  --   --   BILITOT  --   --  1.2  --   --   ALBUMIN  --   --  2.0*  --   --   AMMONIA  --   --  32  --   --     ------------------------------------------------------------------------------------------------------------------ No results for input(s): "CHOL", "HDL", "LDLCALC", "TRIG", "CHOLHDL", "LDLDIRECT" in the last 72 hours.  Lab Results  Component Value Date   HGBA1C 7.2 (H) 06/19/2022   ------------------------------------------------------------------------------------------------------------------ No results for input(s): "TSH", "T4TOTAL", "T3FREE",  "THYROIDAB" in the last 72 hours.  Invalid input(s): "FREET3"  Cardiac Enzymes No results for input(s): "CKMB", "TROPONINI", "MYOGLOBIN" in the last  168 hours.  Invalid input(s): "CK" ------------------------------------------------------------------------------------------------------------------    Component Value Date/Time   BNP 619.0 (H) 06/08/2022 1042    CBG: Recent Labs  Lab 06/24/22 1615 06/25/22 0804  GLUCAP 98 86    Recent Results (from the past 240 hour(s))  Blood Culture (routine x 2)     Status: None   Collection Time: 06/18/22 12:17 PM   Specimen: BLOOD RIGHT HAND  Result Value Ref Range Status   Specimen Description BLOOD RIGHT HAND  Final   Special Requests   Final    BOTTLES DRAWN AEROBIC AND ANAEROBIC Blood Culture adequate volume   Culture   Final    NO GROWTH 5 DAYS Performed at [facility], [address], [place], [place] [address]    Report Status 06/23/2022 FINAL  Final  Blood Culture (routine x 2)     Status: None   Collection Time: 06/18/22 12:17 PM   Specimen: Left Antecubital; Blood  Result Value Ref Range Status   Specimen Description LEFT ANTECUBITAL  Final   Special Requests   Final    BOTTLES DRAWN AEROBIC AND ANAEROBIC Blood Culture adequate volume   Culture   Final    NO GROWTH 5 DAYS Performed at [facility], [address], [place], [place] [address]    Report Status 06/23/2022 FINAL  Final  Resp Panel by RT-PCR (Flu A&B, Covid) Anterior Nasal Swab     Status: None   Collection Time: 06/18/22  1:18 PM   Specimen: Anterior Nasal Swab  Result Value Ref Range Status   SARS Coronavirus 2 by RT PCR NEGATIVE NEGATIVE Final    Comment: (NOTE) SARS-CoV-2 target nucleic acids are NOT DETECTED.  The SARS-CoV-2 RNA is generally detectable in upper respiratory specimens during the acute phase of infection. The lowest concentration of SARS-CoV-2 viral copies this assay can detect is 138 copies/mL. A negative result does not preclude  SARS-Cov-2 infection and should not be used as the sole basis for treatment or other patient management decisions. A negative result may occur with  improper specimen collection/handling, submission of specimen other than nasopharyngeal swab, presence of viral mutation(s) within the areas targeted by this assay, and inadequate number of viral copies(<138 copies/mL). A negative result must be combined with clinical observations, patient history, and epidemiological information. The expected result is Negative.  Fact Sheet for Patients:  EntrepreneurPulse.com.au  Fact Sheet for Healthcare Providers:  IncredibleEmployment.be  This test is no t yet approved or cleared by the [place] FDA and  has been authorized for detection and/or diagnosis of SARS-CoV-2 by FDA under an Emergency Use Authorization (EUA). This EUA will remain  in effect (meaning this test can be used) for the duration of the COVID-19 declaration under Section 564(b)(1) of the Act, 21 U.S.C.section 360bbb-3(b)(1), unless the authorization is terminated  or revoked sooner.       Influenza A by PCR NEGATIVE NEGATIVE Final   Influenza B by PCR NEGATIVE NEGATIVE Final    Comment: (NOTE) The Xpert Xpress SARS-CoV-2/FLU/RSV plus assay is intended as an aid in the diagnosis of influenza from Nasopharyngeal swab specimens and should not be used as a sole basis for treatment. Nasal washings and aspirates are unacceptable for Xpert Xpress SARS-CoV-2/FLU/RSV testing.  Fact Sheet for Patients: EntrepreneurPulse.com.au  Fact Sheet for Healthcare Providers: IncredibleEmployment.be  This test is not yet approved or cleared by the [place] FDA and has been authorized for detection and/or diagnosis of SARS-CoV-2 by FDA under an Emergency Use Authorization (EUA).  This EUA will remain in effect (meaning this test can be used) for the duration of  the COVID-19 declaration under Section 564(b)(1) of the Act, 21 U.S.C. section 360bbb-3(b)(1), unless the authorization is terminated or revoked.  Performed at [facility], [address], [place], [place] [address]   Urine Culture     Status: None   Collection Time: 06/18/22  1:42 PM   Specimen: In/Out Cath Urine  Result Value Ref Range Status   Specimen Description   Final    IN/OUT CATH URINE Performed at [facility], [address], [place], [place] [address]    Special Requests   Final    NONE Performed at [facility], [address], [place], [place] [address]    Culture   Final    NO GROWTH Performed at [facility], [place] [address], [place], [place] [address]    Report Status 06/19/2022 FINAL  Final  MRSA Next Gen by PCR, Nasal     Status: None   Collection Time: 06/18/22  5:29 PM   Specimen: Nasal Mucosa; Nasal Swab  Result Value Ref Range Status   MRSA by PCR Next Gen NOT DETECTED NOT DETECTED Final    Comment: (NOTE) The GeneXpert MRSA Assay (FDA approved for NASAL specimens only), is one component of a comprehensive MRSA colonization surveillance program. It is not intended to diagnose MRSA infection nor to guide or monitor treatment for MRSA infections. Test performance is not FDA approved in patients less than 28 years old. Performed at [facility], [address] [place], [place] [address]      Radiology Studies: No results found.   Marzetta Board, MD, PhD [facility]  Between 7 am - 7 pm I am available, please contact me via Amion (for emergencies) or Securechat (non urgent messages)  Between 7 pm - 7 am I am not available, please contact night coverage MD/APP via Amion

## 2022-06-28 ENCOUNTER — Other Ambulatory Visit: Payer: BC Managed Care – PPO

## 2022-06-28 ENCOUNTER — Ambulatory Visit: Payer: BC Managed Care – PPO

## 2022-06-28 ENCOUNTER — Ambulatory Visit: Payer: BC Managed Care – PPO | Admitting: Hematology

## 2022-06-28 DIAGNOSIS — R569 Unspecified convulsions: Secondary | ICD-10-CM | POA: Diagnosis not present

## 2022-06-28 LAB — BPAM RBC
Blood Product Expiration Date: 202401142359
Blood Product Expiration Date: 202401152359
ISSUE DATE / TIME: 202312181735
ISSUE DATE / TIME: 202312200846
Unit Type and Rh: 6200
Unit Type and Rh: 6200

## 2022-06-28 LAB — COMPREHENSIVE METABOLIC PANEL
ALT: 14 U/L (ref 0–44)
AST: 18 U/L (ref 15–41)
Albumin: 1.7 g/dL — ABNORMAL LOW (ref 3.5–5.0)
Alkaline Phosphatase: 201 U/L — ABNORMAL HIGH (ref 38–126)
Anion gap: 9 (ref 5–15)
BUN: 10 mg/dL (ref 6–20)
CO2: 23 mmol/L (ref 22–32)
Calcium: 7.5 mg/dL — ABNORMAL LOW (ref 8.9–10.3)
Chloride: 99 mmol/L (ref 98–111)
Creatinine, Ser: 0.66 mg/dL (ref 0.44–1.00)
GFR, Estimated: >60 mL/min (ref >60)
Glucose, Bld: 104 mg/dL — ABNORMAL HIGH (ref 70–99)
Potassium: 3.4 mmol/L — ABNORMAL LOW (ref 3.5–5.1)
Sodium: 131 mmol/L — ABNORMAL LOW (ref 135–145)
Total Bilirubin: 0.8 mg/dL (ref 0.3–1.2)
Total Protein: 4.2 g/dL — ABNORMAL LOW (ref 6.5–8.1)

## 2022-06-28 LAB — TYPE AND SCREEN
ABO/RH(D): A POS
Antibody Screen: NEGATIVE
Unit division: 0
Unit division: 0

## 2022-06-28 LAB — CBC
HCT: 24.4 % — ABNORMAL LOW (ref 36.0–46.0)
Hemoglobin: 8.4 g/dL — ABNORMAL LOW (ref 12.0–15.0)
MCH: 30.7 pg (ref 26.0–34.0)
MCHC: 34.4 g/dL (ref 30.0–36.0)
MCV: 89.1 fL (ref 80.0–100.0)
Platelets: 50 10*3/uL — ABNORMAL LOW (ref 150–400)
RBC: 2.74 MIL/uL — ABNORMAL LOW (ref 3.87–5.11)
RDW: 18.1 % — ABNORMAL HIGH (ref 11.5–15.5)
WBC: 5.7 10*3/uL (ref 4.0–10.5)
nRBC: 0.9 % — ABNORMAL HIGH (ref 0.0–0.2)

## 2022-06-28 LAB — MAGNESIUM: Magnesium: 1.1 mg/dL — ABNORMAL LOW (ref 1.7–2.4)

## 2022-06-28 MED ORDER — POTASSIUM CHLORIDE CRYS ER 20 MEQ PO TBCR
30.0000 meq | EXTENDED_RELEASE_TABLET | Freq: Once | ORAL | Status: AC
  Administered 2022-06-28: 30 meq via ORAL
  Filled 2022-06-28: qty 1

## 2022-06-28 MED ORDER — LEVETIRACETAM IN NACL 1000 MG/100ML IV SOLN
1000.0000 mg | Freq: Two times a day (BID) | INTRAVENOUS | Status: DC
  Administered 2022-06-28 – 2022-06-30 (×4): 1000 mg via INTRAVENOUS
  Filled 2022-06-28 (×4): qty 100

## 2022-06-28 MED ORDER — MAGNESIUM SULFATE 4 GM/100ML IV SOLN
4.0000 g | Freq: Once | INTRAVENOUS | Status: AC
  Administered 2022-06-28: 4 g via INTRAVENOUS
  Filled 2022-06-28: qty 100

## 2022-06-28 NOTE — Progress Notes (Signed)
PROGRESS NOTE  Joanna Reid FXT:[id number] DOB: 23-Jul-1965 DOA: 06/18/2022 PCP: Lindell Spar, MD   LOS: 10 days   Brief Narrative / Interim history: 56 year old female with refractory multiple myeloma status post relapse, hypertension, gout, GERD, type 2 diabetes mellitus, migraine headaches, stroke with residual aphasia presented to ED for ongoing weakness and lethargy. Last had chemotherapy 2 weeks ago. She has recently received PRBC and platelets in the cancer center. While in the ED patient had acute mental status change, unarousable with full tonic-clonic seizure lasting 2 to 3 minutes which improved with lorazepam. Transferred to [facility] for further neuro evaluation and follow-up as well as continuous EEG.   Subjective / 24h Interval events: No overnight events.  She is sleeping this morning, but wakes up easily, no complaints.  Family is at bedside, they tell me that she was awake this morning and ate breakfast.  Assesement and Plan: Principal Problem:   Generalized weakness Active Problems:   Multiple myeloma (HCC)   Hyperlipidemia   Acute on chronic anemia   Leukopenia   Stroke (cerebrum) -Left parietal lobe, 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    Chronic pain disorder   LVH (left ventricular hypertrophy)   Morbid obesity with BMI of 45.0-49.9, adult (HCC)   Drug-induced polyneuropathy (HCC)   History of CVA with residual deficit   Overactive bladder   Subclinical hypothyroidism   Hypokalemia   Grand mal seizure ([place])   Hypomagnesemia   Status epilepticus (HCC)   Principal problem New Onset Partial Status Epilepticus in the setting of underlying CVA, acute metabolic encephalopathy -neurology consulted and following.  MRI on admission showed a 2 mm acute white matter infarct lateral to the right caudate head, and also tiny subacute infarcts in the anterior right corona radiata and left occipital lobe white matter.  EEG was concerning for  possible seizures.  Currently she is on phenobarbital continue tapering down, Vimpat and Keppra.  She was on phenytoin which was stopped 12/19. -Continue aspirin and statin for the stroke.  Workup with a 2D echo showed normal EF 65 to 29%, grade 1 diastolic dysfunction, bubble study was negative, A1c 7.2.  Therapy is recommended CIR  Active problems Abdominal pain - secondary to nephrolithiasis(nonobstructive) Incidental liver lesions noted -Patient indicates her previous flank pain is consistent with her history of nephrolithiasis, CTA notable for nonobstructing nephrolithasis -per husband these are somewhat chronic. Continue IV fluids and Flomax until p.o. intake is appropriate.  Abdominal imaging incidentally showed multiple liver lesions, which will require an MRI as an outpatient.  Hypomagnesemia, hypokalemia -continue to monitor, low today, replete again and recheck in the morning   Refractory Multiple Myeloma - Followed by Dr. Delton Coombes. Last chemo given 2 weeks prior to admit   Pancytopenia - Secondary to myeloma, platelets gradually improving.  Received a total of 3 units of packed red blood cells, last one 12/20.  Hemoglobin and platelets are stable this morning  Scheduled Meds:  acyclovir  400 mg Oral BID   allopurinol  300 mg Oral Daily   aspirin  300 mg Rectal Daily   Chlorhexidine Gluconate Cloth  6 each Topical Daily   docusate sodium  100 mg Oral Daily   escitalopram  10 mg Oral Daily   fluticasone  2 spray Each Nare Daily   influenza vac split quadrivalent PF  0.5 mL Intramuscular Tomorrow-1000   melatonin  3 mg Oral QHS   pantoprazole  40 mg Oral Daily   PHENObarbital  65 mg Intravenous BID   Followed by   Derrill Memo ON 06/30/2022] PHENObarbital  65 mg Intravenous Daily   Followed by   Derrill Memo ON 07/04/2022] PHENObarbital  32.5 mg Intravenous Daily   pregabalin  200 mg Oral BID   rosuvastatin  20 mg Oral Daily   sodium chloride flush  10-40 mL Intracatheter Q12H    tamsulosin  0.4 mg Oral Daily   Continuous Infusions:  lacosamide (VIMPAT) IV 100 mg (06/27/22 2308)   lactated ringers 70 mL/hr at 06/28/22 0338   levETIRAcetam 1,500 mg (06/27/22 2237)   magnesium sulfate bolus IVPB     PRN Meds:.acetaminophen **OR** acetaminophen, albuterol, bisacodyl, diclofenac Sodium, fentaNYL (SUBLIMAZE) injection, LORazepam, ondansetron **OR** ondansetron (ZOFRAN) IV, oxyCODONE, polyethylene glycol, polyvinyl alcohol, sodium chloride flush  Current Outpatient Medications  Medication Instructions   acyclovir (ZOVIRAX) 400 MG tablet TAKE (1) TABLET BY MOUTH TWICE DAILY.   albuterol (VENTOLIN HFA) 108 (90 Base) MCG/ACT inhaler 2 puffs, Inhalation, Every 4 hours PRN   allopurinol (ZYLOPRIM) 300 MG tablet TAKE (1) TABLET BY MOUTH ONCE DAILY.   amLODipine (NORVASC) 2.5 mg, Oral, Daily   aspirin EC 81 mg, Oral, Daily with breakfast, For stroke prophylaxis   BD PEN NEEDLE NANO U/F 32G X 4 MM MISC Subcutaneous   bumetanide (BUMEX) 0.5 mg, Oral, Daily   carvedilol (COREG) 25 mg, Oral, 2 times daily   dexamethasone (DECADRON) 20 mg, Oral, 2 times weekly, Take on day 1 and day 3 along with Xpovio.   diclofenac Sodium (VOLTAREN) 2 g, Topical, 4 times daily   docusate sodium (COLACE) 100 mg, Oral, Daily   doxycycline (VIBRAMYCIN) 100 mg, Oral, 2 times daily   Dulaglutide 1.5 mg, Subcutaneous, Every 7 days   escitalopram (LEXAPRO) 10 MG tablet TAKE (1) TABLET BY MOUTH ONCE DAILY. MAY START WITH 1/2 TABLET FOR 7 DAYS.   fluticasone (FLONASE) 50 MCG/ACT nasal spray INSTILL 2 SPRAYS INTO BOTH NOSTRILS DAILY   Insulin Lispro w/ Trans Port 20 Units, Subcutaneous, See admin instructions, Inject before meals per sliding scale Max TDD 20 units   Lactulose 20 g, Oral, Daily PRN   lidocaine-prilocaine (EMLA) cream 1 Application, Topical, As needed   LORazepam (ATIVAN) 0.5 MG tablet TAKE (1) TABLET BY MOUTH EVERY EIGHT HOURS   losartan (COZAAR) 100 mg, Oral, Daily   magnesium oxide  (MAG-OX) 400 mg, Oral, 3 times daily   metFORMIN (GLUCOPHAGE) 500 mg, Oral, 2 times daily   metoCLOPramide (REGLAN) 10 mg, Oral, Every 8 hours PRN   nitrofurantoin, macrocrystal-monohydrate, (MACROBID) 100 MG capsule TAKE (1) CAPSULE BY MOUTH AT BEDTIME.   oxyCODONE-acetaminophen (PERCOCET/ROXICET) 5-325 MG tablet 1 tablet, Oral, 2 times daily PRN   pantoprazole (PROTONIX) 40 MG tablet TAKE (1) TABLET BY MOUTH ONCE DAILY.   polyethylene glycol (MIRALAX / GLYCOLAX) 17 g, Oral, Daily PRN   pregabalin (LYRICA) 200 MG capsule TAKE (1) CAPSULE BY MOUTH TWICE DAILY.   Propylene Glycol (SYSTANE BALANCE) 0.6 % SOLN 1 drop, Ophthalmic, Daily PRN   rosuvastatin (CRESTOR) 5 mg, Oral, Daily   scopolamine (TRANSDERM-SCOP) 1.5 mg, Transdermal, every 72 hours   selinexor (XPOVIO) 80 mg, Oral, 2 times weekly, Take on days 1 and 3 of each week.   traMADol (ULTRAM) 50 mg, Oral, Every 8 hours PRN    Diet Orders (From admission, onward)     Start     Ordered   06/21/22 1202  DIET - DYS 1 Room service appropriate? Yes; Fluid consistency: Thin  Diet effective now  Comments: No applesauce please  Question Answer Comment  Room service appropriate? Yes   Fluid consistency: Thin      06/21/22 1201            DVT prophylaxis: SCDs Start: 06/18/22 1513   Lab Results  Component Value Date   PLT 50 (L) 06/28/2022      Code Status: Full Code  Family Communication: Family at bedside  Status is: Inpatient  Remains inpatient appropriate because: severity of illness  Level of care: Progressive  Consultants:  Neurology   Objective: Vitals:   06/27/22 2046 06/28/22 0013 06/28/22 0400 06/28/22 0730  BP:  122/77 (!) 89/62 110/71  Pulse: 89 89 92 88  Resp: _0 Temp: 99 F (37.2 C) 97.7 F (36.5 C)  97.8 F (36.6 C)  TempSrc: Oral Oral  Oral  SpO2: 98% 96% 97% 94%  Weight:      Height:        Intake/Output Summary (Last 24 hours) at 06/28/2022 1054 Last data filed at  06/27/2022 1153 Gross per 24 hour  Intake 344 ml  Output --  Net 344 ml    Wt Readings from Last 3 Encounters:  06/19/22 109.4 kg  06/08/22 107.5 kg  05/17/22 109.5 kg    Examination:  Constitutional: NAD Eyes: lids and conjunctivae normal, no scleral icterus ENMT: mmm Neck: normal, supple Respiratory: clear to auscultation bilaterally, no wheezing, no crackles. Normal respiratory effort.  Cardiovascular: Regular rate and rhythm, no murmurs / rubs / gallops.  Trace LE edema. Abdomen: soft, no distention, no tenderness. Bowel sounds positive.  Skin: no rashes Neurologic: no focal deficits, equal strength   Data Reviewed: I have independently reviewed following labs and imaging studies   CBC Recent Labs  Lab 06/23/22 0638 06/25/22 0518 06/25/22 2153 06/27/22 0615 06/28/22 0256  WBC 4.1 4.3  --  4.4 5.7  HGB 7.1* 6.6* 7.8* 6.9* 8.4*  HCT 20.9* 18.9* 22.1* 19.2* 24.4*  PLT 24* 29*  --  36* 50*  MCV 88.2 86.3  --  86.1 89.1  MCH 30.0 30.1  --  30.9 30.7  MCHC 34.0 34.9  --  35.9 34.4  RDW 18.4* 18.5*  --  18.3* 18.1*     Recent Labs  Lab 06/23/22 0638 06/24/22 0915 06/25/22 0518 06/27/22 0615 06/28/22 0256  NA 135  --  131* 133* 131*  K 3.7  --  3.3* 3.4* 3.4*  CL 101  --  99 103 99  CO2 24  --  _1 GLUCOSE 109*  --  90 80 104*  BUN 13  --  _2 CREATININE 0.43*  --  0.49 0.32* 0.66  CALCIUM 7.8*  --  7.7* 7.2* 7.5*  AST  --  23  --   --  18  ALT  --  15  --   --  14  ALKPHOS  --  197*  --   --  201*  BILITOT  --  1.2  --   --  0.8  ALBUMIN  --  2.0*  --   --  1.7*  MG  --   --   --   --  1.1*  AMMONIA  --  32  --   --   --      ------------------------------------------------------------------------------------------------------------------ No results for input(s): "CHOL", "HDL", "LDLCALC", "TRIG", "CHOLHDL", "LDLDIRECT" in the last 72 hours.  Lab Results  Component Value Date   HGBA1C 7.2 (H)  06/19/2022    ------------------------------------------------------------------------------------------------------------------ No results for input(s): "TSH", "T4TOTAL", "T3FREE", "THYROIDAB" in the last 72 hours.  Invalid input(s): "FREET3"  Cardiac Enzymes No results for input(s): "CKMB", "TROPONINI", "MYOGLOBIN" in the last 168 hours.  Invalid input(s): "CK" ------------------------------------------------------------------------------------------------------------------    Component Value Date/Time   BNP 619.0 (H) 06/08/2022 1042    CBG: Recent Labs  Lab 06/24/22 1615 06/25/22 0804  GLUCAP 98 86     Recent Results (from the past 240 hour(s))  Blood Culture (routine x 2)     Status: None   Collection Time: 06/18/22 12:17 PM   Specimen: BLOOD RIGHT HAND  Result Value Ref Range Status   Specimen Description BLOOD RIGHT HAND  Final   Special Requests   Final    BOTTLES DRAWN AEROBIC AND ANAEROBIC Blood Culture adequate volume   Culture   Final    NO GROWTH 5 DAYS Performed at [facility], [address], [place], [place] [address]    Report Status 06/23/2022 FINAL  Final  Blood Culture (routine x 2)     Status: None   Collection Time: 06/18/22 12:17 PM   Specimen: Left Antecubital; Blood  Result Value Ref Range Status   Specimen Description LEFT ANTECUBITAL  Final   Special Requests   Final    BOTTLES DRAWN AEROBIC AND ANAEROBIC Blood Culture adequate volume   Culture   Final    NO GROWTH 5 DAYS Performed at [facility], [address] [place], [place] [address]    Report Status 06/23/2022 FINAL  Final  Resp Panel by RT-PCR (Flu A&B, Covid) Anterior Nasal Swab     Status: None   Collection Time: 06/18/22  1:18 PM   Specimen: Anterior Nasal Swab  Result Value Ref Range Status   SARS Coronavirus 2 by RT PCR NEGATIVE NEGATIVE Final    Comment: (NOTE) SARS-CoV-2 target nucleic acids are NOT DETECTED.  The SARS-CoV-2 RNA is generally detectable in upper  respiratory specimens during the acute phase of infection. The lowest concentration of SARS-CoV-2 viral copies this assay can detect is 138 copies/mL. A negative result does not preclude SARS-Cov-2 infection and should not be used as the sole basis for treatment or other patient management decisions. A negative result may occur with  improper specimen collection/handling, submission of specimen other than nasopharyngeal swab, presence of viral mutation(s) within the areas targeted by this assay, and inadequate number of viral copies(<138 copies/mL). A negative result must be combined with clinical observations, patient history, and epidemiological information. The expected result is Negative.  Fact Sheet for Patients:  EntrepreneurPulse.com.au  Fact Sheet for Healthcare Providers:  IncredibleEmployment.be  This test is no t yet approved or cleared by the [place] FDA and  has been authorized for detection and/or diagnosis of SARS-CoV-2 by FDA under an Emergency Use Authorization (EUA). This EUA will remain  in effect (meaning this test can be used) for the duration of the COVID-19 declaration under Section 564(b)(1) of the Act, 21 U.S.C.section 360bbb-3(b)(1), unless the authorization is terminated  or revoked sooner.       Influenza A by PCR NEGATIVE NEGATIVE Final   Influenza B by PCR NEGATIVE NEGATIVE Final    Comment: (NOTE) The Xpert Xpress SARS-CoV-2/FLU/RSV plus assay is intended as an aid in the diagnosis of influenza from Nasopharyngeal swab specimens and should not be used as a sole basis for treatment. Nasal washings and aspirates are unacceptable for Xpert Xpress SARS-CoV-2/FLU/RSV testing.  Fact Sheet for Patients: EntrepreneurPulse.com.au  Fact Sheet  for Healthcare Providers: IncredibleEmployment.be  This test is not yet approved or cleared by the [place] and has been  authorized for detection and/or diagnosis of SARS-CoV-2 by FDA under an Emergency Use Authorization (EUA). This EUA will remain in effect (meaning this test can be used) for the duration of the COVID-19 declaration under Section 564(b)(1) of the Act, 21 U.S.C. section 360bbb-3(b)(1), unless the authorization is terminated or revoked.  Performed at [facility], [address], [place], [place] [address]   Urine Culture     Status: None   Collection Time: 06/18/22  1:42 PM   Specimen: In/Out Cath Urine  Result Value Ref Range Status   Specimen Description   Final    IN/OUT CATH URINE Performed at [facility], [address] [place], [place] [address]    Special Requests   Final    NONE Performed at [facility], [address], [place], [place] [address]    Culture   Final    NO GROWTH Performed at [facility], [place] [address] [place], [place] [address]    Report Status 06/19/2022 FINAL  Final  MRSA Next Gen by PCR, Nasal     Status: None   Collection Time: 06/18/22  5:29 PM   Specimen: Nasal Mucosa; Nasal Swab  Result Value Ref Range Status   MRSA by PCR Next Gen NOT DETECTED NOT DETECTED Final    Comment: (NOTE) The GeneXpert MRSA Assay (FDA approved for NASAL specimens only), is one component of a comprehensive MRSA colonization surveillance program. It is not intended to diagnose MRSA infection nor to guide or monitor treatment for MRSA infections. Test performance is not FDA approved in patients less than 65 years old. Performed at [facility], [address] [place], [place] [address]      Radiology Studies: No results found.   Marzetta Board, MD, PhD [facility]  Between 7 am - 7 pm I am available, please contact me via Amion (for emergencies) or Securechat (non urgent messages)  Between 7 pm - 7 am I am not available, please contact night coverage MD/APP via Amion

## 2022-06-28 NOTE — Progress Notes (Addendum)
Subjective: No acute events overnight.  No new concerns.  Mother-in-law at bedside.  ROS: negative except above  Examination  Vital signs in last 24 hours: Temp:  [97.7 F (36.5 C)-100.5 F (38.1 C)] 97.8 F (36.6 C) (12/21 0730) Pulse Rate:  [86-95] 88 (12/21 0730) Resp:  [17-21] 17 (12/21 0730) BP: (89-136)/(62-89) 110/71 (12/21 0730) SpO2:  [94 %-98 %] 94 % (12/21 0730)  General: lying in bed, not in apparent distress. Neuro: Slightly more awake today but still keeps falling asleep at times, did smile and able to tell me her name, correctly identified her mother-in-law and was able to tell me the name of her husband.  Attempted to follow simple one-step commands but could not follow two-step commands, could not name objects but appropriately said yes or no when given 2 choices, PERRLA, EOMI, no facial asymmetry, antigravity strength in all 4 extremities with right hemiparesis  Basic Metabolic Panel: Recent Labs  Lab 06/23/22 0638 06/25/22 0518 06/27/22 0615 06/28/22 0256  NA 135 131* 133* 131*  K 3.7 3.3* 3.4* 3.4*  CL 101 99 103 99  CO2 _0 GLUCOSE 109* 90 80 104*  BUN _1 CREATININE 0.43* 0.49 0.32* 0.66  CALCIUM 7.8* 7.7* 7.2* 7.5*  MG  --   --   --  1.1*    CBC: Recent Labs  Lab 06/23/22 0638 06/25/22 0518 06/25/22 2153 06/27/22 0615 06/28/22 0256  WBC 4.1 4.3  --  4.4 5.7  HGB 7.1* 6.6* 7.8* 6.9* 8.4*  HCT 20.9* 18.9* 22.1* 19.2* 24.4*  MCV 88.2 86.3  --  86.1 89.1  PLT 24* 29*  --  36* 50*    Coagulation Studies: No results for input(s): "LABPROT", "INR" in the last 72 hours.  Imaging No new brain imaging overnight   ASSESSMENT AND PLAN: 56 year old female with history of multiple medical myeloma,thrombocytopenia, left hemispheric stroke with resultant dysphagia and right-sided weakness which is gradually improving who presented with seizures.   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   Recommendations -Reduce Keppra to 1068m twice daily  -Continue Vimpat 100 mg twice daily, pregabalin 200 mg twice daily and phenobarb taper -Will switch IV meds to p.o. tomorrow if patient is able to consistently tolerate p.o. today -If patient continues to improve, will most likely be ready for discharge to rehab by Monday morning -Continue seizure precautions -Continue PT/OT, speech therapy -Management of rest of comorbidities per primary team -Discussed plan with mother-in-law  at bedside and Dr. GCruzita Lederervia secure chat   I have spent a total of 36 minutes with the patient reviewing hospital notes,  test results, labs and examining the patient as well as establishing an assessment and plan that was discussed personally with the patient's mother-in-law at bedside.  > 50% of time was spent in direct patient care.  PZeb ComfortEpilepsy [facility] For questions after 5pm please refer to AMION to reach the Neurologist on call

## 2022-06-28 NOTE — Progress Notes (Signed)
Physical Therapy Treatment Patient Details Name: Joanna Reid MRN: [id number] DOB: [birth year] Today's Date: 06/28/2022   History of Present Illness 56 yo female presents to [facility] on 12/11 with headache and lethargy. + seizures in ED. MRI brain shows small incidental stroke in the right caudate. EEG shows Continuous L lateralized periodic discharges (high risk for seizures). Pt with multiple myeloma currently undergoing treatment last chemo 2 weeks ago.  Other PMH includes hypertension, gout, GERD, type 2 diabetes mellitus, migraine headaches, stroke with residual aphasia    PT Comments    Pt continues to be limited by lethargy and difficulty maintain alertness throughout session despite sitting up EOB. Pt continues to need max assist for all bed mobility and min-mod assist to maintain sitting balance as pt with intermittent posterior and left lateral lean, increasing as pt fatigue increasing. Pt with some verbalizations during session thanking this PTA at end of session. Initiated anterior/posterior weight shifting in sitting to increase weight bearing through [employer] in preparation for transfers [place]. Pt continues to benefit from skilled PT services to progress toward functional mobility goals.    Recommendations for follow up therapy are one component of a multi-disciplinary discharge planning process, led by the attending physician.  Recommendations may be updated based on patient status, additional functional criteria and insurance authorization.  Follow Up Recommendations  Acute inpatient rehab (3hours/day)     Assistance Recommended at Discharge Frequent or constant Supervision/Assistance  Patient can return home with the following Two people to help with walking and/or transfers;A lot of help with bathing/dressing/bathroom;Assistance with cooking/housework;Direct supervision/assist for medications management;Help with stairs or ramp for entrance   Equipment Recommendations  Other  (comment) (defer to post acute)    Recommendations for Other Services       Precautions / Restrictions Precautions Precautions: Fall Restrictions Weight Bearing Restrictions: No     Mobility  Bed Mobility Overal bed mobility: Needs Assistance Bed Mobility: Supine to Sit, Sit to Supine, Rolling Rolling: Max assist   Supine to sit: Max assist Sit to supine: Max assist   General bed mobility comments: assist for trunk and LE management, scooting to/from EOB, boost up, and repositioning once in supine; pt is able to assist with boosting up with verbal/tactile cues for hand placement/foot placement Patient Response: Cooperative, Flat affect  Transfers                   General transfer comment: Due to current postural proprioceptive control pt is unsafe to transfer without 2 person assist or lift.    Ambulation/Gait                   Stairs             Wheelchair Mobility    Modified Rankin (Stroke Patients Only) Modified Rankin (Stroke Patients Only) Pre-Morbid Rankin Score: No significant disability Modified Rankin: Severe disability     Balance Overall balance assessment: Needs assistance Sitting-balance support: Feet supported, Single extremity supported Sitting balance-Leahy Scale: Fair Sitting balance - Comments: Initially pt requires Mod to Min trunk assist but was able to improve to close SBA/CGA with verbal cues when pt would start to lean posteriorly or to the L Postural control: Posterior lean, Left lateral lean                                  Cognition Arousal/Alertness: Lethargic, Awake/alert Behavior During Therapy: Flat affect Overall  Cognitive Status: Impaired/Different from baseline Area of Impairment: Attention, Following commands, Safety/judgement, Problem solving, Awareness                   Current Attention Level: Focused   Following Commands: Follows one step commands  inconsistently Safety/Judgement: Decreased awareness of safety, Decreased awareness of deficits Awareness: Intellectual Problem Solving: Slow processing, Decreased initiation, Difficulty sequencing, Requires verbal cues, Requires tactile cues General Comments: Pt has very flat effect and answers questions only ~25% of the time with yes and no. Occasionally will speak up with input.        Exercises General Exercises - Lower Extremity Ankle Circles/Pumps: Both, AROM, 10 reps Other Exercises Other Exercises: long axis IR/ER Other Exercises: anterior/posterior weight shifting    General Comments        Pertinent Vitals/Pain Pain Assessment Pain Assessment: Faces Faces Pain Scale: No hurt Pain Intervention(s): Monitored during session    Home Living                          Prior Function            PT Goals (current goals can now be found in the care plan section) Acute Rehab PT Goals PT Goal Formulation: With patient Time For Goal Achievement: 07/06/22    Frequency    Min 4X/week      PT Plan Current plan remains appropriate    Co-evaluation              AM-PAC PT "6 Clicks" Mobility   Outcome Measure  Help needed turning from your back to your side while in a flat bed without using bedrails?: A Lot Help needed moving from lying on your back to sitting on the side of a flat bed without using bedrails?: A Lot Help needed moving to and from a bed to a chair (including a wheelchair)?: Total Help needed standing up from a chair using your arms (e.g., wheelchair or bedside chair)?: Total Help needed to walk in hospital room?: Total Help needed climbing 3-5 steps with a railing? : Total 6 Click Score: 8    End of Session   Activity Tolerance: Patient limited by fatigue Patient left: in bed;with call bell/phone within reach;with bed alarm set;with family/visitor present Nurse Communication: Mobility status PT Visit Diagnosis: Other abnormalities  of gait and mobility (R26.89);Muscle weakness (generalized) (M62.81)     Time: 7989-2119 PT Time Calculation (min) (ACUTE ONLY): 26 min  Charges:  $Therapeutic Activity: 23-37 mins                     Kaziyah Parkison R. PTA Acute Rehabilitation Services Office: [place] 06/28/2022, 3:41 PM

## 2022-06-28 NOTE — Plan of Care (Signed)

## 2022-06-29 DIAGNOSIS — R569 Unspecified convulsions: Secondary | ICD-10-CM | POA: Diagnosis not present

## 2022-06-29 LAB — BASIC METABOLIC PANEL
Anion gap: 7 (ref 5–15)
BUN: 11 mg/dL (ref 6–20)
CO2: 24 mmol/L (ref 22–32)
Calcium: 7.4 mg/dL — ABNORMAL LOW (ref 8.9–10.3)
Chloride: 104 mmol/L (ref 98–111)
Creatinine, Ser: 0.53 mg/dL (ref 0.44–1.00)
GFR, Estimated: >60 mL/min (ref >60)
Glucose, Bld: 102 mg/dL — ABNORMAL HIGH (ref 70–99)
Potassium: 3.6 mmol/L (ref 3.5–5.1)
Sodium: 135 mmol/L (ref 135–145)

## 2022-06-29 LAB — CBC
HCT: 22.6 % — ABNORMAL LOW (ref 36.0–46.0)
Hemoglobin: 7.8 g/dL — ABNORMAL LOW (ref 12.0–15.0)
MCH: 30.5 pg (ref 26.0–34.0)
MCHC: 34.5 g/dL (ref 30.0–36.0)
MCV: 88.3 fL (ref 80.0–100.0)
Platelets: 47 10*3/uL — ABNORMAL LOW (ref 150–400)
RBC: 2.56 MIL/uL — ABNORMAL LOW (ref 3.87–5.11)
RDW: 18.6 % — ABNORMAL HIGH (ref 11.5–15.5)
WBC: 5.2 10*3/uL (ref 4.0–10.5)
nRBC: 0.8 % — ABNORMAL HIGH (ref 0.0–0.2)

## 2022-06-29 LAB — MAGNESIUM: Magnesium: 2.5 mg/dL — ABNORMAL HIGH (ref 1.7–2.4)

## 2022-06-29 MED ORDER — ASPIRIN 325 MG PO TABS
325.0000 mg | ORAL_TABLET | Freq: Every day | ORAL | Status: DC
  Administered 2022-06-29 – 2022-07-11 (×13): 325 mg via ORAL
  Filled 2022-06-29 (×13): qty 1

## 2022-06-29 NOTE — Progress Notes (Signed)
Inpatient Rehab Admissions Coordinator:    Pt. Currently is not at a level to tolerate the intensity of CIR. I will follow a few more sessions with therapy, but Pt. Likely needs SNF. Discussed with pt.'s husband and he stated understanding.   Clemens Catholic, [place], [place] Admissions Coordinator  [phone] ([place]) [phone] (office)

## 2022-06-29 NOTE — Progress Notes (Signed)
PROGRESS NOTE  Joanna Reid TDV:[id number] DOB: Nov 02, 1965 DOA: 06/18/2022 PCP: Lindell Spar, MD   LOS: 11 days   Brief Narrative / Interim history: 56 year old female with refractory multiple myeloma status post relapse, hypertension, gout, GERD, type 2 diabetes mellitus, migraine headaches, stroke with residual aphasia presented to ED for ongoing weakness and lethargy. Last had chemotherapy 2 weeks ago. She has recently received PRBC and platelets in the cancer center. While in the ED patient had acute mental status change, unarousable with full tonic-clonic seizure lasting 2 to 3 minutes which improved with lorazepam. Transferred to [place] for further neuro evaluation and follow-up as well as continuous EEG.  She has been placed on multiple antiepileptics, now some AEDs have been discontinued or are being tapered down as below  Subjective / 24h Interval events: Alert this morning, smiling.  Husband is at bedside.  No significant overnight events she is feeling well today, no chest pain, no abdominal pain, no nausea or vomiting  Assesement and Plan: Principal Problem:   Generalized weakness Active Problems:   Multiple myeloma (HCC)   Hyperlipidemia   Acute on chronic anemia   Leukopenia   Stroke (cerebrum) -Left parietal lobe, 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    Chronic pain disorder   LVH (left ventricular hypertrophy)   Morbid obesity with BMI of 45.0-49.9, adult (HCC)   Drug-induced polyneuropathy (HCC)   History of CVA with residual deficit   Overactive bladder   Subclinical hypothyroidism   Hypokalemia   Grand mal seizure ([place])   Hypomagnesemia   Status epilepticus (HCC)   Principal problem New Onset Partial Status Epilepticus in the setting of underlying CVA, acute metabolic encephalopathy -neurology consulted and following.  MRI on admission showed a 2 mm acute white matter infarct lateral to the right caudate head, and also tiny subacute  infarcts in the anterior right corona radiata and left occipital lobe white matter.  EEG was concerning for possible seizures.  Currently she is on phenobarbital continue tapering down, Vimpat and Keppra.  She was on phenytoin which was stopped 12/19.  Discussed with Dr. Hortense Ramal with neurology, convert antiepileptics to p.o. today -Continue aspirin and statin for the stroke.  Change aspirin to p.o. since she is not able to take orally more consistently.  Workup with a 2D echo showed normal EF 65 to 06%, grade 1 diastolic dysfunction, bubble study was negative, A1c 7.2.  Therapy is recommended CIR, I think she is improving to the point that she will tolerate  Active problems Abdominal pain - secondary to nephrolithiasis(nonobstructive) Incidental liver lesions noted -Patient indicates her previous flank pain is consistent with her history of nephrolithiasis, CTA notable for nonobstructing nephrolithasis -per husband these are somewhat chronic. Continue IV fluids and Flomax until p.o. intake is appropriate.  Abdominal imaging incidentally showed multiple liver lesions, which will require an MRI as an outpatient.  Hypomagnesemia, hypokalemia -continue to monitor, low today, replete again and recheck in the morning   Refractory Multiple Myeloma - Followed by Dr. Delton Coombes. Last chemo given 2 weeks prior to admit   Pancytopenia - Secondary to myeloma, platelets gradually improving and are overall stable.  Received a total of 3 units of packed red blood cells, last one 12/20.  Hemoglobin and platelets are stable this morning  Scheduled Meds:  acyclovir  400 mg Oral BID   allopurinol  300 mg Oral Daily   aspirin  300 mg Rectal Daily   Chlorhexidine Gluconate Cloth  6  each Topical Daily   docusate sodium  100 mg Oral Daily   escitalopram  10 mg Oral Daily   fluticasone  2 spray Each Nare Daily   influenza vac split quadrivalent PF  0.5 mL Intramuscular Tomorrow-1000   melatonin  3 mg Oral QHS    pantoprazole  40 mg Oral Daily   PHENObarbital  65 mg Intravenous BID   Followed by   Derrill Memo ON 06/30/2022] PHENObarbital  65 mg Intravenous Daily   Followed by   Derrill Memo ON 07/04/2022] PHENObarbital  32.5 mg Intravenous Daily   pregabalin  200 mg Oral BID   rosuvastatin  20 mg Oral Daily   sodium chloride flush  10-40 mL Intracatheter Q12H   tamsulosin  0.4 mg Oral Daily   Continuous Infusions:  lacosamide (VIMPAT) IV 100 mg (06/28/22 2233)   lactated ringers 70 mL/hr at 06/28/22 1901   levETIRAcetam 1,000 mg (06/28/22 2318)   PRN Meds:.acetaminophen **OR** acetaminophen, albuterol, bisacodyl, diclofenac Sodium, fentaNYL (SUBLIMAZE) injection, LORazepam, ondansetron **OR** ondansetron (ZOFRAN) IV, oxyCODONE, polyethylene glycol, polyvinyl alcohol, sodium chloride flush  Current Outpatient Medications  Medication Instructions   acyclovir (ZOVIRAX) 400 MG tablet TAKE (1) TABLET BY MOUTH TWICE DAILY.   albuterol (VENTOLIN HFA) 108 (90 Base) MCG/ACT inhaler 2 puffs, Inhalation, Every 4 hours PRN   allopurinol (ZYLOPRIM) 300 MG tablet TAKE (1) TABLET BY MOUTH ONCE DAILY.   amLODipine (NORVASC) 2.5 mg, Oral, Daily   aspirin EC 81 mg, Oral, Daily with breakfast, For stroke prophylaxis   BD PEN NEEDLE NANO U/F 32G X 4 MM MISC Subcutaneous   bumetanide (BUMEX) 0.5 mg, Oral, Daily   carvedilol (COREG) 25 mg, Oral, 2 times daily   dexamethasone (DECADRON) 20 mg, Oral, 2 times weekly, Take on day 1 and day 3 along with Xpovio.   diclofenac Sodium (VOLTAREN) 2 g, Topical, 4 times daily   docusate sodium (COLACE) 100 mg, Oral, Daily   doxycycline (VIBRAMYCIN) 100 mg, Oral, 2 times daily   Dulaglutide 1.5 mg, Subcutaneous, Every 7 days   escitalopram (LEXAPRO) 10 MG tablet TAKE (1) TABLET BY MOUTH ONCE DAILY. MAY START WITH 1/2 TABLET FOR 7 DAYS.   fluticasone (FLONASE) 50 MCG/ACT nasal spray INSTILL 2 SPRAYS INTO BOTH NOSTRILS DAILY   Insulin Lispro w/ Trans Port 20 Units, Subcutaneous, See  admin instructions, Inject before meals per sliding scale Max TDD 20 units   Lactulose 20 g, Oral, Daily PRN   lidocaine-prilocaine (EMLA) cream 1 Application, Topical, As needed   LORazepam (ATIVAN) 0.5 MG tablet TAKE (1) TABLET BY MOUTH EVERY EIGHT HOURS   losartan (COZAAR) 100 mg, Oral, Daily   magnesium oxide (MAG-OX) 400 mg, Oral, 3 times daily   metFORMIN (GLUCOPHAGE) 500 mg, Oral, 2 times daily   metoCLOPramide (REGLAN) 10 mg, Oral, Every 8 hours PRN   nitrofurantoin, macrocrystal-monohydrate, (MACROBID) 100 MG capsule TAKE (1) CAPSULE BY MOUTH AT BEDTIME.   oxyCODONE-acetaminophen (PERCOCET/ROXICET) 5-325 MG tablet 1 tablet, Oral, 2 times daily PRN   pantoprazole (PROTONIX) 40 MG tablet TAKE (1) TABLET BY MOUTH ONCE DAILY.   polyethylene glycol (MIRALAX / GLYCOLAX) 17 g, Oral, Daily PRN   pregabalin (LYRICA) 200 MG capsule TAKE (1) CAPSULE BY MOUTH TWICE DAILY.   Propylene Glycol (SYSTANE BALANCE) 0.6 % SOLN 1 drop, Ophthalmic, Daily PRN   rosuvastatin (CRESTOR) 5 mg, Oral, Daily   scopolamine (TRANSDERM-SCOP) 1.5 mg, Transdermal, every 72 hours   selinexor (XPOVIO) 80 mg, Oral, 2 times weekly, Take on days 1 and 3 of  each week.   traMADol (ULTRAM) 50 mg, Oral, Every 8 hours PRN    Diet Orders (From admission, onward)     Start     Ordered   06/21/22 1202  DIET - DYS 1 Room service appropriate? Yes; Fluid consistency: Thin  Diet effective now       Comments: No applesauce please  Question Answer Comment  Room service appropriate? Yes   Fluid consistency: Thin      06/21/22 1201            DVT prophylaxis: SCDs Start: 06/18/22 1513   Lab Results  Component Value Date   PLT 47 (L) 06/29/2022      Code Status: Full Code  Family Communication: Family at bedside  Status is: Inpatient  Remains inpatient appropriate because: severity of illness  Level of care: Progressive  Consultants:  Neurology   Objective: Vitals:   06/28/22 1432 06/28/22 1952 06/28/22  2335 06/29/22 0353  BP: 95/70 122/75 117/86 (!) 93/55  Pulse: 85 90 87 76  Resp: _0 Temp: 98.5 F (36.9 C) (!) 100.5 F (38.1 C) 98 F (36.7 C) 98.3 F (36.8 C)  TempSrc: Oral Oral Oral Oral  SpO2: 99% 98% 100% 98%  Weight:      Height:        Intake/Output Summary (Last 24 hours) at 06/29/2022 0650 Last data filed at 06/28/2022 1332 Gross per 24 hour  Intake 10 ml  Output --  Net 10 ml    Wt Readings from Last 3 Encounters:  06/19/22 109.4 kg  06/08/22 107.5 kg  05/17/22 109.5 kg    Examination:  Constitutional: NAD Eyes: lids and conjunctivae normal, no scleral icterus ENMT: mmm Neck: normal, supple Respiratory: clear to auscultation bilaterally, no wheezing, no crackles. Normal respiratory effort.  Cardiovascular: Regular rate and rhythm, no murmurs / rubs / gallops. No LE edema. Abdomen: soft, no distention, no tenderness. Bowel sounds positive.  Skin: no rashes Neurologic: no focal deficits, equal strength   Data Reviewed: I have independently reviewed following labs and imaging studies   CBC Recent Labs  Lab 06/23/22 0638 06/25/22 0518 06/25/22 2153 06/27/22 0615 06/28/22 0256 06/29/22 0319  WBC 4.1 4.3  --  4.4 5.7 5.2  HGB 7.1* 6.6* 7.8* 6.9* 8.4* 7.8*  HCT 20.9* 18.9* 22.1* 19.2* 24.4* 22.6*  PLT 24* 29*  --  36* 50* 47*  MCV 88.2 86.3  --  86.1 89.1 88.3  MCH 30.0 30.1  --  30.9 30.7 30.5  MCHC 34.0 34.9  --  35.9 34.4 34.5  RDW 18.4* 18.5*  --  18.3* 18.1* 18.6*     Recent Labs  Lab 06/23/22 0638 06/24/22 0915 06/25/22 0518 06/27/22 0615 06/28/22 0256 06/29/22 0319  NA 135  --  131* 133* 131* 135  K 3.7  --  3.3* 3.4* 3.4* 3.6  CL 101  --  99 103 99 104  CO2 24  --  _1 GLUCOSE 109*  --  90 80 104* 102*  BUN 13  --  _2 CREATININE 0.43*  --  0.49 0.32* 0.66 0.53  CALCIUM 7.8*  --  7.7* 7.2* 7.5* 7.4*  AST  --  23  --   --  18  --   ALT  --  15  --   --  14  --   ALKPHOS  --  197*  --   --  201*  --  BILITOT  --  1.2  --   --  0.8  --   ALBUMIN  --  2.0*  --   --  1.7*  --   MG  --   --   --   --  1.1* 2.5*  AMMONIA  --  32  --   --   --   --      ------------------------------------------------------------------------------------------------------------------ No results for input(s): "CHOL", "HDL", "LDLCALC", "TRIG", "CHOLHDL", "LDLDIRECT" in the last 72 hours.  Lab Results  Component Value Date   HGBA1C 7.2 (H) 06/19/2022   ------------------------------------------------------------------------------------------------------------------ No results for input(s): "TSH", "T4TOTAL", "T3FREE", "THYROIDAB" in the last 72 hours.  Invalid input(s): "FREET3"  Cardiac Enzymes No results for input(s): "CKMB", "TROPONINI", "MYOGLOBIN" in the last 168 hours.  Invalid input(s): "CK" ------------------------------------------------------------------------------------------------------------------    Component Value Date/Time   BNP 619.0 (H) 06/08/2022 1042    CBG: Recent Labs  Lab 06/24/22 1615 06/25/22 0804  GLUCAP 98 86     No results found for this or any previous visit (from the past 240 hour(s)).    Radiology Studies: No results found.   Marzetta Board, MD, PhD [facility]  Between 7 am - 7 pm I am available, please contact me via Amion (for emergencies) or Securechat (non urgent messages)  Between 7 pm - 7 am I am not available, please contact night coverage MD/APP via Amion

## 2022-06-29 NOTE — Evaluation (Signed)
Clinical/Bedside Swallow RE-Evaluation Patient Details  Name: Joanna Reid MRN: [id number] Date of Birth: 1966/03/13  Today's Date: 06/29/2022 Time: SLP Start Time (ACUTE ONLY): 0855 SLP Stop Time (ACUTE ONLY): 0935 SLP Time Calculation (min) (ACUTE ONLY): 40 min  Past Medical History:  Past Medical History:  Diagnosis Date   Acid reflux    Allergic rhinitis    Cancer ([place])    multiple myeloma   Diabetes mellitus    type 2   Gout    Gout    H/o COVID-19--- was Positive 09/17/2019, Negative 12/23/19 AND also Neg on  12/26/19 12/26/2019   HBP (high blood pressure)    History of kidney stones    Migraines    Multiple myeloma ([place]) 10/29/2017   Past Surgical History:  Past Surgical History:  Procedure Laterality Date   BREAST CYST EXCISION Left    2009 no visible scar on skin   CESAREAN SECTION     COLONOSCOPY WITH PROPOFOL N/A 12/25/2019   Procedure: COLONOSCOPY WITH PROPOFOL;  Surgeon: Rogene Houston, MD;  Location: [facility];  Service: Endoscopy;  Laterality: N/A;  730   EXTRACORPOREAL SHOCK WAVE LITHOTRIPSY Left 10/10/2017   Procedure: LEFT EXTRACORPOREAL SHOCK WAVE LITHOTRIPSY (ESWL);  Surgeon: Bjorn Loser, MD;  Location: [facility];  Service: Urology;  Laterality: Left;   EYE SURGERY     HEMORRHOID SURGERY N/A 11/19/2012   Procedure: HEMORRHOIDECTOMY;  Surgeon: Jamesetta So, MD;  Location: [facility];  Service: General;  Laterality: N/A;   IR IMAGING GUIDED PORT INSERTION  01/18/2022   kidney stones  1998   LAPAROSCOPIC UNILATERAL SALPINGO OOPHERECTOMY  05/14/2012   Procedure: LAPAROSCOPIC UNILATERAL SALPINGO OOPHORECTOMY;  Surgeon: [name], MD;  Location: [facility];  Service: Gynecology;  Laterality: Right;  laparoscopic right salpingo-oophorectomy   PARTIAL HYSTERECTOMY     POLYPECTOMY  12/25/2019   Procedure: POLYPECTOMY;  Surgeon: Rogene Houston, MD;  Location: [facility];  Service: Endoscopy;;   TONSILECTOMY, ADENOIDECTOMY, BILATERAL MYRINGOTOMY AND TUBES      VESICOVAGINAL FISTULA CLOSURE W/ TAH     HPI:  56 year old female with refractory multiple myeloma status post relapse, hypertension, gout, GERD, type 2 diabetes mellitus, migraine headaches, stroke with residual aphasia presented to ED today due to ongoing weakness and lethargy.  She reportedly last had chemotherapy 2 weeks ago.  She has recently received PRBC and platelets in the cancer center.   Her work up revealed a K of 2.5, Hg was 7.7, WBC 2.6, platelets 15.  She noted no spontaneous bleeding. Pt had a seizure while being evaluated by MD in the ED. MRI increased diffusion signal in the left thalamus and parietal lobe. She also has a small incidental stroke in the right caudate. BSE requested.    Assessment / Plan / Recommendation  Clinical Impression  Pt has shown good improvement since last seen by SLP. She was alert, able to communicate more effectively (expressive aphasia more evident this date). Pt assisted with oral care/suction, requiring cues for attention to task. Following oral care, pt accepted trials of peaches and grape juice. Cues required for adequate mastication. Oral cavity clear after trials of Dys 2 solids. No overt s/s aspiration observed following liquids or dys 2 textures. Pt declined trials of graham crackers. At this time, will advance diet to dys2 (finely chopped) with thin liquids.   Pt's husband reported pt's expressive language is at about 55% back to baseline. Pt was able to answer some questions, but neologistic paraphasias  were evident. Worked with RN to determine best method of providing PO meds. Recommend providing meds whole with either liquid or puree, depending on mentation, checking for oral residue after the swallow. Safe swallow precautions updated and posted at [facility]. SLP will continue to follow to assess tolerance of Dys2 textures, and readiness to advance further.  SLP Visit Diagnosis: Dysphagia, unspecified (R13.10)    Aspiration Risk  Mild aspiration  risk    Diet Recommendation Dysphagia 2 (Fine chop);Thin liquid   Liquid Administration via: Cup;Straw Medication Administration:  (whole meds with liquid or puree, depending on mental status) Supervision: Staff to assist with self feeding;Full supervision/cueing for compensatory strategies Compensations: Minimize environmental distractions;Slow rate;Small sips/bites Postural Changes: Seated upright at 90 degrees;Remain upright for at least 30 minutes after po intake    Other  Recommendations Oral Care Recommendations: Staff/trained caregiver to provide oral care;Oral care QID Other Recommendations: Have oral suction available    Recommendations for follow up therapy are one component of a multi-disciplinary discharge planning process, led by the attending physician.  Recommendations may be updated based on patient status, additional functional criteria and insurance authorization.  Follow up Recommendations Acute inpatient rehab (3hours/day)      Assistance Recommended at Discharge  24/7  Functional Status Assessment Patient has had a recent decline in their functional status and demonstrates the ability to make significant improvements in function in a reasonable and predictable amount of time.  Frequency and Duration min 2x/week  1 week;2 weeks       Prognosis Prognosis for Safe Diet Advancement: Good      Swallow Study   General Date of Onset: 06/18/22 HPI: 56 year old female with refractory multiple myeloma status post relapse, hypertension, gout, GERD, type 2 diabetes mellitus, migraine headaches, stroke with residual aphasia presented to ED today due to ongoing weakness and lethargy.  She reportedly last had chemotherapy 2 weeks ago.  She has recently received PRBC and platelets in the cancer center.   Her work up revealed a K of 2.5, Hg was 7.7, WBC 2.6, platelets 15.  She noted no spontaneous bleeding. Pt had a seizure while being evaluated by MD in the ED. MRI increased  diffusion signal in the left thalamus and parietal lobe. She also has a small incidental stroke in the right caudate. BSE requested. Type of Study: Bedside Swallow Evaluation Previous Swallow Assessment: BSE 06/20/22 - puree/thin Diet Prior to this Study: Dysphagia 1 (puree);Thin liquids Temperature Spikes Noted: No Respiratory Status: Room air History of Recent Intubation: No Behavior/Cognition: Alert;Cooperative;Requires cueing Oral Cavity Assessment: Within Functional Limits Oral Care Completed by SLP: Yes Oral Cavity - Dentition: Adequate natural dentition;Missing dentition Vision: Functional for self-feeding Self-Feeding Abilities: Needs assist;Needs set up Patient Positioning: Upright in bed Baseline Vocal Quality: Normal Volitional Cough: Strong Volitional Swallow: Able to elicit    Oral/Motor/Sensory Function Overall Oral Motor/Sensory Function: Within functional limits   Ice Chips Ice chips: Not tested   Thin Liquid Thin Liquid: Within functional limits Presentation: Straw    Nectar Thick Nectar Thick Liquid: Not tested   Honey Thick Honey Thick Liquid: Not tested   Puree Puree: Within functional limits Presentation: Spoon   Solid     Solid: Impaired Presentation: Spoon Oral Phase Functional Implications: Prolonged oral transit;Other (comment) (cues given to chew well)     Joanna Reid B. Quentin Ore, [facility], [place] [facility] Language Pathologist Office: [phone]  Joanna Reid 06/29/2022,9:47 AM

## 2022-06-29 NOTE — Progress Notes (Signed)
Subjective: Continuing to improve. Able to have more of a conversation today.   ROS: negative except above  Examination  Vital signs in last 24 hours: Temp:  [98 F (36.7 C)-100.5 F (38.1 C)] 98.6 F (37 C) (12/22 1121) Pulse Rate:  [76-90] 87 (12/22 1121) Resp:  [16-17] 16 (12/22 1121) BP: (93-129)/(55-86) 129/81 (12/22 1121) SpO2:  [98 %-100 %] 98 % (12/22 1121)  General: lying in bed, not in apparent distress. Neuro: awake, alert, oriented to slef and person but not to place and time, able to have q conversation but still has trouble with naming objects, attempts to follow simple one-step commands but could not follow two-step commands, PERRLA, EOMI, no facial asymmetry, antigravity strength in all 4 extremities with right hemiparesis  Basic Metabolic Panel: Recent Labs  Lab 06/23/22 0638 06/25/22 0518 06/27/22 0615 06/28/22 0256 06/29/22 0319  NA 135 131* 133* 131* 135  K 3.7 3.3* 3.4* 3.4* 3.6  CL 101 99 103 99 104  CO2 _0 GLUCOSE 109* 90 80 104* 102*  BUN _1 CREATININE 0.43* 0.49 0.32* 0.66 0.53  CALCIUM 7.8* 7.7* 7.2* 7.5* 7.4*  MG  --   --   --  1.1* 2.5*    CBC: Recent Labs  Lab 06/23/22 0638 06/25/22 0518 06/25/22 2153 06/27/22 0615 06/28/22 0256 06/29/22 0319  WBC 4.1 4.3  --  4.4 5.7 5.2  HGB 7.1* 6.6* 7.8* 6.9* 8.4* 7.8*  HCT 20.9* 18.9* 22.1* 19.2* 24.4* 22.6*  MCV 88.2 86.3  --  86.1 89.1 88.3  PLT 24* 29*  --  36* 50* 47*     Coagulation Studies: No results for input(s): "LABPROT", "INR" in the last 72 hours.  Imaging No new brain imaging overnight   ASSESSMENT AND PLAN: 56 year old female with history of multiple medical myeloma,thrombocytopenia, left hemispheric stroke with resultant dysphagia and right-sided weakness which is gradually improving who presented with seizures.   Epilepsy Chronic stroke Multiple myeloma Acute encephalopathy, improving Pancytopenia -Epilepsy likely due to underlying  stroke -Encephalopathy could be due to postictal state, medications.   Recommendations -Continue Keppra 1023m twice daily,  Vimpat 100 mg twice daily, pregabalin 200 mg twice daily and phenobarb taper - Switch IV meds to p.o. today - Rescue meds: intranasal valtoco 1739min each nostril for clinical seizure lasting over 5 minutes. If not approved by insurance, can prescribe Clonazepam ODT [id number]  clinical seizure lasting over 5 minutes -If patient continues to improve, will most likely be ready for discharge to rehab by Monday morning -Continue seizure precautions -Continue PT/OT, speech therapy -Management of rest of comorbidities per primary team -Discussed plan with husband at bedside and Dr. GheCruzita Lederer- F/u with neuro in 3 months. May consider repeat MRI brain at that time if still has language deficits   Seizure precautions: Per [facility], patients with seizures are not allowed to drive until they have been seizure-free for six months and cleared by a physician    Use caution when using heavy equipment or power tools. Avoid working on ladders or at heights. Take showers instead of baths. Ensure the water temperature is not too high on the home water heater. Do not go swimming alone. Do not lock yourself in a room alone (i.e. bathroom). When caring for infants or small children, sit down when holding, feeding, or changing them to minimize risk of injury to the child in the event you have a  seizure. Maintain good sleep hygiene. Avoid alcohol.    If patient has another seizure, call 911 and bring them back to the ED if: A.  The seizure lasts longer than 5 minutes.      B.  The patient doesn't wake shortly after the seizure or has new problems such as difficulty seeing, speaking or moving following the seizure C.  The patient was injured during the seizure D.  The patient has a temperature over 102 F (39C) E.  The patient vomited during the seizure and now is having  trouble breathing    During the Seizure   - First, ensure adequate ventilation and place patients on the floor on their left side  Loosen clothing around the neck and ensure the airway is patent. If the patient is clenching the teeth, do not force the mouth open with any object as this can cause severe damage - Remove all items from the surrounding that can be hazardous. The patient may be oblivious to what's happening and may not even know what he or she is doing. If the patient is confused and wandering, either gently guide him/her away and block access to outside areas - Reassure the individual and be comforting - Call 911. In most cases, the seizure ends before EMS arrives. However, there are cases when seizures may last over 3 to 5 minutes. Or the individual may have developed breathing difficulties or severe injuries. If a pregnant patient or a person with diabetes develops a seizure, it is prudent to call an ambulance. - Finally, if the patient does not regain full consciousness, then call EMS. Most patients will remain confused for about 45 to 90 minutes after a seizure, so you must use judgment in calling for help.    After the Seizure (Postictal Stage)   After a seizure, most patients experience confusion, fatigue, muscle pain and/or a headache. Thus, one should permit the individual to sleep. For the next few days, reassurance is essential. Being calm and helping reorient the person is also of importance.   Most seizures are painless and end spontaneously. Seizures are not harmful to others but can lead to complications such as stress on the lungs, brain and the heart. Individuals with prior lung problems may develop labored breathing and respiratory distress.   I have spent a total of 37 minutes with the patient reviewing hospital notes,  test results, labs and examining the patient as well as establishing an assessment and plan that was discussed personally with the patient's  mother-in-law at bedside.  > 50% of time was spent in direct patient care.    Zeb Comfort Epilepsy [facility] For questions after 5pm please refer to AMION to reach the Neurologist on call

## 2022-06-30 DIAGNOSIS — R531 Weakness: Secondary | ICD-10-CM | POA: Diagnosis not present

## 2022-06-30 LAB — CBC
HCT: 23.1 % — ABNORMAL LOW (ref 36.0–46.0)
Hemoglobin: 7.6 g/dL — ABNORMAL LOW (ref 12.0–15.0)
MCH: 29.8 pg (ref 26.0–34.0)
MCHC: 32.9 g/dL (ref 30.0–36.0)
MCV: 90.6 fL (ref 80.0–100.0)
Platelets: 50 10*3/uL — ABNORMAL LOW (ref 150–400)
RBC: 2.55 MIL/uL — ABNORMAL LOW (ref 3.87–5.11)
RDW: 18.6 % — ABNORMAL HIGH (ref 11.5–15.5)
WBC: 4.3 10*3/uL (ref 4.0–10.5)
nRBC: 0.5 % — ABNORMAL HIGH (ref 0.0–0.2)

## 2022-06-30 LAB — COMPREHENSIVE METABOLIC PANEL
ALT: 13 U/L (ref 0–44)
AST: 25 U/L (ref 15–41)
Albumin: 1.7 g/dL — ABNORMAL LOW (ref 3.5–5.0)
Alkaline Phosphatase: 197 U/L — ABNORMAL HIGH (ref 38–126)
Anion gap: 9 (ref 5–15)
BUN: 11 mg/dL (ref 6–20)
CO2: 24 mmol/L (ref 22–32)
Calcium: 7.6 mg/dL — ABNORMAL LOW (ref 8.9–10.3)
Chloride: 100 mmol/L (ref 98–111)
Creatinine, Ser: 0.45 mg/dL (ref 0.44–1.00)
GFR, Estimated: >60 mL/min (ref >60)
Glucose, Bld: 119 mg/dL — ABNORMAL HIGH (ref 70–99)
Potassium: 3.5 mmol/L (ref 3.5–5.1)
Sodium: 133 mmol/L — ABNORMAL LOW (ref 135–145)
Total Bilirubin: 0.6 mg/dL (ref 0.3–1.2)
Total Protein: 3.8 g/dL — ABNORMAL LOW (ref 6.5–8.1)

## 2022-06-30 LAB — MAGNESIUM: Magnesium: 1.4 mg/dL — ABNORMAL LOW (ref 1.7–2.4)

## 2022-06-30 MED ORDER — PHENOBARBITAL 32.4 MG PO TABS: 32.4000 mg | ORAL_TABLET | Freq: Every day | ORAL | Status: DC

## 2022-06-30 MED ORDER — MAGNESIUM SULFATE 2 GM/50ML IV SOLN
2.0000 g | Freq: Once | INTRAVENOUS | Status: AC
  Administered 2022-06-30: 2 g via INTRAVENOUS
  Filled 2022-06-30: qty 50

## 2022-06-30 MED ORDER — LACOSAMIDE 50 MG PO TABS
100.0000 mg | ORAL_TABLET | Freq: Two times a day (BID) | ORAL | Status: DC
  Administered 2022-06-30 – 2022-07-11 (×22): 100 mg via ORAL
  Filled 2022-06-30 (×22): qty 2

## 2022-06-30 MED ORDER — LEVETIRACETAM 500 MG PO TABS
1000.0000 mg | ORAL_TABLET | Freq: Two times a day (BID) | ORAL | Status: DC
  Administered 2022-06-30 – 2022-07-11 (×22): 1000 mg via ORAL
  Filled 2022-06-30 (×22): qty 2

## 2022-06-30 MED ORDER — PHENOBARBITAL 32.4 MG PO TABS
32.4000 mg | ORAL_TABLET | Freq: Every day | ORAL | Status: AC
  Administered 2022-07-04 – 2022-07-07 (×4): 32.4 mg via ORAL
  Filled 2022-06-30 (×4): qty 1

## 2022-06-30 MED ORDER — PHENOBARBITAL 32.4 MG PO TABS
64.8000 mg | ORAL_TABLET | Freq: Every day | ORAL | Status: AC
  Administered 2022-07-01 – 2022-07-03 (×3): 64.8 mg via ORAL
  Filled 2022-06-30 (×3): qty 2

## 2022-06-30 MED ORDER — PHENOBARBITAL 32.4 MG PO TABS
64.8000 mg | ORAL_TABLET | Freq: Every day | ORAL | Status: DC
  Filled 2022-06-30: qty 2

## 2022-06-30 NOTE — Progress Notes (Addendum)
PROGRESS NOTE    Joanna Reid   GUY:[id number] DOB: 1966/05/01  DOA: 06/18/2022 Date of Service: 06/30/22 PCP: Lindell Spar, MD     Brief Narrative / Hospital Course:  Brief Narrative / Interim history: 56 year old female with refractory multiple myeloma status post relapse, hypertension, gout, GERD, type 2 diabetes mellitus, migraine headaches, stroke with residual aphasia presented to ED for ongoing weakness and lethargy. Last had chemotherapy 2 weeks ago. She has recently received PRBC and platelets in the cancer center. While in the ED patient had acute mental status change, unarousable with full tonic-clonic seizure lasting 2 to 3 minutes which improved with lorazepam. Transferred to [place] for further neuro evaluation and follow-up as well as continuous EEG.  She has been placed on multiple antiepileptics, now some AEDs have been discontinued or are being tapered down as below under guidance from neurology       Consultants:  Neurology  Oncology   Procedures: None         Assesement and Plan:  Principal Problem:   Generalized weakness Active Problems:   Multiple myeloma (HCC)   Hyperlipidemia   Acute on chronic anemia   Leukopenia   Stroke (cerebrum) -Left parietal lobe, 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    Chronic pain disorder   LVH (left ventricular hypertrophy)   Morbid obesity with BMI of 45.0-49.9, adult (HCC)   Drug-induced polyneuropathy ([place])   History of CVA with residual deficit   Overactive bladder   Subclinical hypothyroidism   Hypokalemia   Grand mal seizure ([place])   Hypomagnesemia   Status epilepticus (HCC)       New Onset Partial Status Epilepticus in the setting of underlying CVA, acute metabolic encephalopathy  MRI on admission showed a 2 mm acute white matter infarct lateral to the right caudate head, and also tiny subacute infarcts in the anterior right corona radiata and left occipital lobe white  matter.   EEG was concerning for possible seizures.   Workup with a 2D echo showed normal EF 65 to 56%, grade 1 diastolic dysfunction, bubble study was negative, A1c 7.2.  neurology consulted and following She was on phenytoin which was stopped 12/19. Currently she is on phenobarbital continue tapering down, Vimpat and Keppra IV.  Per Dr. Hortense Ramal with neurology, can convert IV antiepileptics to p.o. --> have asked pharmacy to confirm equivalent doses  Continue aspirin and statin for the stroke.     Therapy is recommended CIR, she's been a bit lethargic may be more appropriate for SNF, will see after the weekend  Outpatient plan: F/u with neuro in 3 months. May consider repeat MRI brain at that time if still has language deficits     Abdominal pain - secondary to nephrolithiasis(nonobstructive) Incidental liver lesions noted  Patient indicates her previous flank pain is consistent with her history of nephrolithiasis, CTA notable for nonobstructing nephrolithasis -per husband these are somewhat chronic.   Abdominal imaging incidentally showed multiple liver lesions, which will require an MRI as an outpatient.   Hypomagnesemia, hypokalemia  continue to monitor Mg low today, replete again and recheck in the morning    Pancytopenia  Secondary to myeloma platelets gradually improving and are overall stable.   Received a total of 3 units of packed red blood cells, last one 12/20.   Hemoglobin and platelets are stable this morning but slightly lower than yesterday Monitor CBC Transfuse as needed   Refractory Multiple Myeloma  Followed by Dr. Delton Coombes.  Last chemo given 2 weeks prior to admit          DVT prophylaxis: SCD Pertinent IV fluids/nutrition: dysphagia 2 diet per SLP eval 12/22 Central lines / invasive devices: R chest port   Code Status: FULL CODE   Disposition: inpatient  TOC needs: CIR probably Monday 12/25 per neurology  Barriers to discharge / significant pending  items: pending CIR and transition off IV meds to po           Subjective:  Patient somnolent but rousable Family at bedside provides history, confirms she isn't sleeping well at night and has been more tired this morning but nothing else out of the ordinary. Pt can answer basic questions.  Denies CP/SOB.  Pain controlled.  Tolerating diet.   Family Communication: Daughter at bedside on rounds     Objective Findings:  Vitals:   06/29/22 2351 06/30/22 0336 06/30/22 0744 06/30/22 1124  BP: 118/71 (!) 124/94 (!) 154/94 119/87  Pulse:   91 90  Resp: _0 Temp: 97.6 F (36.4 C) 98.4 F (36.9 C) 98.3 F (36.8 C) 98 F (36.7 C)  TempSrc: Axillary Axillary Oral Oral  SpO2: 97% 98% 98% 98%  Weight:      Height:        Intake/Output Summary (Last 24 hours) at 06/30/2022 1404 Last data filed at 06/30/2022 0435 Gross per 24 hour  Intake 2894.87 ml  Output 950 ml  Net 1944.87 ml   Filed Weights   06/18/22 1128 06/18/22 1728 06/19/22 0500  Weight: 107 kg 109.3 kg 109.4 kg    Examination:  Constitutional:  VS as above General Appearance: well-developed, well-nourished, NAD Ears, Nose, Mouth, Throat: MMM Respiratory: Normal respiratory effort No wheeze No rhonchi No rales Cardiovascular: S1/S2 normal No murmur RRR No lower extremity edema Gastrointestinal: No tenderness Musculoskeletal:  No clubbing/cyanosis of digits Neurological: No cranial nerve deficit on limited exam        Scheduled Medications:   acyclovir  400 mg Oral BID   allopurinol  300 mg Oral Daily   aspirin  325 mg Oral Daily   Chlorhexidine Gluconate Cloth  6 each Topical Daily   docusate sodium  100 mg Oral Daily   escitalopram  10 mg Oral Daily   fluticasone  2 spray Each Nare Daily   influenza vac split quadrivalent PF  0.5 mL Intramuscular Tomorrow-1000   lacosamide  100 mg Oral BID   levETIRAcetam  1,000 mg Oral BID   melatonin  3 mg Oral QHS   pantoprazole  40 mg  Oral Daily   phenobarbital  64.8 mg Oral Daily   Followed by   Derrill Memo ON 07/03/2022] phenobarbital  32.4 mg Oral Daily   pregabalin  200 mg Oral BID   rosuvastatin  20 mg Oral Daily   sodium chloride flush  10-40 mL Intracatheter Q12H   tamsulosin  0.4 mg Oral Daily    Continuous Infusions:    PRN Medications:  acetaminophen **OR** acetaminophen, albuterol, bisacodyl, diclofenac Sodium, fentaNYL (SUBLIMAZE) injection, LORazepam, ondansetron **OR** ondansetron (ZOFRAN) IV, oxyCODONE, polyethylene glycol, polyvinyl alcohol, sodium chloride flush  Antimicrobials:  Anti-infectives (From admission, onward)    Start     Dose/Rate Route Frequency Ordered Stop   06/19/22 1200  vancomycin (VANCOREADY) IVPB 1500 mg/300 mL  Status:  Discontinued        1,500 mg 150 mL/hr over 120 Minutes Intravenous Every 24 hours 06/18/22 1152 06/18/22 1514   06/18/22 2200  acyclovir (ZOVIRAX)  tablet 400 mg       Note to Pharmacy: TAKE (1) TABLET BY MOUTH TWICE DAILY.     400 mg Oral 2 times daily 06/18/22 1514     06/18/22 2000  ceFEPIme (MAXIPIME) 2 g in sodium chloride 0.9 % 100 mL IVPB  Status:  Discontinued        2 g 200 mL/hr over 30 Minutes Intravenous Every 8 hours 06/18/22 1152 06/18/22 1514   06/18/22 1200  vancomycin (VANCOREADY) IVPB 2000 mg/400 mL        2,000 mg 200 mL/hr over 120 Minutes Intravenous  Once 06/18/22 1149 06/18/22 1451   06/18/22 1145  ceFEPIme (MAXIPIME) 2 g in sodium chloride 0.9 % 100 mL IVPB        2 g 200 mL/hr over 30 Minutes Intravenous  Once 06/18/22 1144 06/18/22 1304   06/18/22 1145  metroNIDAZOLE (FLAGYL) IVPB 500 mg        500 mg 100 mL/hr over 60 Minutes Intravenous  Once 06/18/22 1144 06/18/22 1431   06/18/22 1145  vancomycin (VANCOCIN) IVPB 1000 mg/200 mL premix  Status:  Discontinued        1,000 mg 200 mL/hr over 60 Minutes Intravenous  Once 06/18/22 1144 06/18/22 1149           Data Reviewed: I have personally reviewed following labs and imaging  studies  CBC: Recent Labs  Lab 06/25/22 0518 06/25/22 2153 06/27/22 0615 06/28/22 0256 06/29/22 0319 06/30/22 0243  WBC 4.3  --  4.4 5.7 5.2 4.3  HGB 6.6* 7.8* 6.9* 8.4* 7.8* 7.6*  HCT 18.9* 22.1* 19.2* 24.4* 22.6* 23.1*  MCV 86.3  --  86.1 89.1 88.3 90.6  PLT 29*  --  36* 50* 47* 50*   Basic Metabolic Panel: Recent Labs  Lab 06/25/22 0518 06/27/22 0615 06/28/22 0256 06/29/22 0319 06/30/22 0243  NA 131* 133* 131* 135 133*  K 3.3* 3.4* 3.4* 3.6 3.5  CL 99 103 99 104 100  CO2 _0 GLUCOSE 90 80 104* 102* 119*  BUN _1 CREATININE 0.49 0.32* 0.66 0.53 0.45  CALCIUM 7.7* 7.2* 7.5* 7.4* 7.6*  MG  --   --  1.1* 2.5* 1.4*   GFR: Estimated Creatinine Clearance: 93.8 mL/min (by C-G formula based on SCr of 0.45 mg/dL). Liver Function Tests: Recent Labs  Lab 06/24/22 0915 06/28/22 0256 06/30/22 0243  AST _2 ALT _3 ALKPHOS 197* 201* 197*  BILITOT 1.2 0.8 0.6  PROT 4.5* 4.2* 3.8*  ALBUMIN 2.0* 1.7* 1.7*   No results for input(s): "LIPASE", "AMYLASE" in the last 168 hours. Recent Labs  Lab 06/24/22 0915  AMMONIA 32   Coagulation Profile: No results for input(s): "INR", "PROTIME" in the last 168 hours. Cardiac Enzymes: No results for input(s): "CKTOTAL", "CKMB", "CKMBINDEX", "TROPONINI" in the last 168 hours. BNP (last 3 results) No results for input(s): "PROBNP" in the last 8760 hours. HbA1C: No results for input(s): "HGBA1C" in the last 72 hours. CBG: Recent Labs  Lab 06/24/22 1615 06/25/22 0804  GLUCAP 98 86   Lipid Profile: No results for input(s): "CHOL", "HDL", "LDLCALC", "TRIG", "CHOLHDL", "LDLDIRECT" in the last 72 hours. Thyroid Function Tests: No results for input(s): "TSH", "T4TOTAL", "FREET4", "T3FREE", "THYROIDAB" in the last 72 hours. Anemia Panel: No results for input(s): "VITAMINB12", "FOLATE", "FERRITIN", "TIBC", "IRON", "RETICCTPCT" in the last 72 hours. Most Recent Urinalysis On File:     Component  Value Date/Time   COLORURINE YELLOW 06/18/2022 1342   APPEARANCEUR CLOUDY (A) 06/18/2022 1342   APPEARANCEUR Cloudy (A) 05/02/2022 1113   LABSPEC 1.028 06/18/2022 1342   PHURINE 5.0 06/18/2022 1342   GLUCOSEU NEGATIVE 06/18/2022 1342   HGBUR NEGATIVE 06/18/2022 1342   BILIRUBINUR NEGATIVE 06/18/2022 1342   BILIRUBINUR Negative 05/02/2022 1113   KETONESUR 5 (A) 06/18/2022 1342   PROTEINUR 30 (A) 06/18/2022 1342   UROBILINOGEN 0.2 11/27/2021 1534   UROBILINOGEN 0.2 05/07/2012 0829   NITRITE NEGATIVE 06/18/2022 1342   LEUKOCYTESUR NEGATIVE 06/18/2022 1342   Sepsis Labs: _0 (procalcitonin:4,lacticidven:4)  No results found for this or any previous visit (from the past 240 hour(s)).       Radiology Studies: ECHOCARDIOGRAM COMPLETE BUBBLE STUDY  Result Date: 06/19/2022    ECHOCARDIOGRAM REPORT   Patient Name:   VENEZIA SARGEANT Date of Exam: 06/19/2022 Medical Rec #:  [id number]        Height:       64.0 in Accession #:    [id number]       Weight:       241.2 lb Date of Birth:  1966/05/11        BSA:          2.118 m Patient Age:    49 years         BP:           161/92 mmHg Patient Gender: F                HR:           86 bpm. Exam Location:  [facility] Procedure: 2D Echo, Cardiac Doppler, Color Doppler and Saline Contrast Bubble            Study Indications:    Stroke  History:        Patient has prior history of Echocardiogram examinations, most                 recent 01/16/2022. Stroke; Risk Factors:Hypertension, Diabetes,                 Dyslipidemia and Former Smoker. Multiple myleoma, Port-a-cath in                 place.  Sonographer:    Wenda Low Referring Phys: [id number] JAN A MANSY  Sonographer Comments: Patient is obese. IMPRESSIONS  1. Left ventricular ejection fraction, by estimation, is 60 to 65%. The left ventricle has normal function. The left ventricle has no regional wall motion abnormalities. There is moderate asymmetric left ventricular hypertrophy of the  septal segment. Left ventricular diastolic parameters are consistent with Grade I diastolic dysfunction (impaired relaxation).  2. Right ventricular systolic function is normal. The right ventricular size is normal. There is normal pulmonary artery systolic pressure. The estimated right ventricular systolic pressure is 35.7 mmHg.  3. The mitral valve is grossly normal. Trivial mitral valve regurgitation.  4. The aortic valve is tricuspid. Aortic valve regurgitation is mild. Aortic regurgitation PHT measures 603 msec.  5. Aortic dilatation noted. There is mild dilatation of the ascending aorta, measuring 41 mm.  6. The inferior vena cava is normal in size with <50% respiratory variability, suggesting right atrial pressure of 8 mmHg.  7. Agitated saline contrast bubble study was negative, with no evidence of right to left interatrial shunt. Comparison(s): Prior images reviewed side by side. LVEF remains normal range at 60-65%. Mildly dilated ascending aorta with mild aortic regurgitation. FINDINGS  Left Ventricle:  Left ventricular ejection fraction, by estimation, is 60 to 65%. The left ventricle has normal function. The left ventricle has no regional wall motion abnormalities. The left ventricular internal cavity size was normal in size. There is  moderate asymmetric left ventricular hypertrophy of the septal segment. Left ventricular diastolic parameters are consistent with Grade I diastolic dysfunction (impaired relaxation). Right Ventricle: The right ventricular size is normal. No increase in right ventricular wall thickness. Right ventricular systolic function is normal. There is normal pulmonary artery systolic pressure. The tricuspid regurgitant velocity is 2.20 m/s, and  with an assumed right atrial pressure of 8 mmHg, the estimated right ventricular systolic pressure is 12.4 mmHg. Left Atrium: Left atrial size was normal in size. Right Atrium: Right atrial size was normal in size. Pericardium: There is no  evidence of pericardial effusion. Mitral Valve: The mitral valve is grossly normal. Trivial mitral valve regurgitation. MV peak gradient, 4.1 mmHg. The mean mitral valve gradient is 2.0 mmHg. Tricuspid Valve: The tricuspid valve is grossly normal. Tricuspid valve regurgitation is trivial. Aortic Valve: The aortic valve is tricuspid. Aortic valve regurgitation is mild. Aortic regurgitation PHT measures 603 msec. Aortic valve mean gradient measures 5.0 mmHg. Aortic valve peak gradient measures 12.4 mmHg. Aortic valve area, by VTI measures 2.95 cm. Pulmonic Valve: The pulmonic valve was grossly normal. Pulmonic valve regurgitation is trivial. Aorta: The aortic root is normal in size and structure and aortic dilatation noted. There is mild dilatation of the ascending aorta, measuring 41 mm. Venous: The inferior vena cava is normal in size with less than 50% respiratory variability, suggesting right atrial pressure of 8 mmHg. IAS/Shunts: No atrial level shunt detected by color flow Doppler. Agitated saline contrast was given intravenously to evaluate for intracardiac shunting. Agitated saline contrast bubble study was negative, with no evidence of any interatrial shunt.  LEFT VENTRICLE PLAX 2D LVIDd:         5.20 cm   Diastology LVIDs:         3.30 cm   LV e' medial:    5.22 cm/s LV PW:         1.30 cm   LV E/e' medial:  15.2 LV IVS:        1.50 cm   LV e' lateral:   7.62 cm/s LVOT diam:     2.10 cm   LV E/e' lateral: 10.4 LV SV:         93 LV SV Index:   44 LVOT Area:     3.46 cm  RIGHT VENTRICLE RV Basal diam:  2.70 cm RV Mid diam:    2.20 cm RV S prime:     14.80 cm/s LEFT ATRIUM             Index        RIGHT ATRIUM           Index LA diam:        4.30 cm 2.03 cm/m   RA Area:     13.20 cm LA Vol (A2C):   62.0 ml 29.27 ml/m  RA Volume:   24.40 ml  11.52 ml/m LA Vol (A4C):   46.4 ml 21.90 ml/m LA Biplane Vol: 54.2 ml 25.59 ml/m  AORTIC VALVE                    PULMONIC VALVE AV Area (Vmax):    2.81 cm     PV  Vmax:       1.29 m/s AV  Area (Vmean):   3.00 cm     PV Peak grad:  6.7 mmHg AV Area (VTI):     2.95 cm AV Vmax:           176.00 cm/s AV Vmean:          95.700 cm/s AV VTI:            0.315 m AV Peak Grad:      12.4 mmHg AV Mean Grad:      5.0 mmHg LVOT Vmax:         143.00 cm/s LVOT Vmean:        83.000 cm/s LVOT VTI:          0.268 m LVOT/AV VTI ratio: 0.85 AI PHT:            603 msec  AORTA Ao Root diam: 3.70 cm Ao Asc diam:  4.10 cm MITRAL VALVE                TRICUSPID VALVE MV Area (PHT): 5.06 cm     TR Peak grad:   19.4 mmHg MV Area VTI:   3.14 cm     TR Vmax:        220.00 cm/s MV Peak grad:  4.1 mmHg MV Mean grad:  2.0 mmHg     SHUNTS MV Vmax:       1.01 m/s     Systemic VTI:  0.27 m MV Vmean:      69.0 cm/s    Systemic Diam: 2.10 cm MV Decel Time: 150 msec MV E velocity: 79.50 cm/s MV A velocity: 114.00 cm/s MV E/A ratio:  0.70 Rozann Lesches MD Electronically signed by Rozann Lesches MD Signature Date/Time: 06/19/2022/10:34:16 AM    Final    MR BRAIN W WO CONTRAST  Result Date: 06/18/2022 CLINICAL DATA:  Provided history: Neuro deficit, acute, stroke suspected. Mental status change, unknown cause. EXAM: MRI HEAD WITHOUT AND WITH CONTRAST MRA HEAD WITHOUT CONTRAST TECHNIQUE: Multiplanar, multi-echo pulse sequences of the brain and surrounding structures were acquired without and with intravenous contrast. Angiographic images of the Circle of Willis were acquired using MRA technique without intravenous contrast. CONTRAST:  36m GADAVIST GADOBUTROL 1 MMOL/ML IV SOLN COMPARISON:  Brain MRI 06/12/2022. CT angiogram head/neck 12/26/2019. FINDINGS: MRI HEAD FINDINGS Intermittently motion degraded examination, limiting evaluation. Most notably, the axial T2 FLAIR sequence is moderately motion degraded, the coronal T2 and T2 FLAIR sequences through the hippocampi are mild to moderately motion degraded and the sagittal T1 weighted postcontrast sequence is moderately motion degraded. Brain: No age  advanced or lobar predominant parenchymal atrophy. Redemonstrated moderate-sized chronic cortical/subcortical infarct within the left parietal lobe (posterior left MCA territory and left MCA/PCA watershed territory). Chronic hemosiderin deposition within portions of this infarct territory. New from the prior brain MRI of 06/12/2022, there is cortical diffusion-weighted signal abnormality along the margins of this chronic infarct (within the right parietal lobe, at the left temporoparietal junction and within the lateral left occipital lobe) (for instance as seen on series 9, images 17 and 23). Subtle diffusion weighted signal abnormality is also suspected within the posteromedial left thalamus (series 9, image 17). Findings are suspicious for seizure related changes, although acute infarcts are also a consideration. Redemonstrated tiny foci of diffusion-weighted signal abnormality and T2 FLAIR hyperintense signal abnormality measuring up to 3 mm within the anterior right corona radiata (series 11, image 17) and left occipital lobe white matter (series 9, image 16). These were present on the prior  brain MRI of 06/12/2022, and likely reflect small subacute infarcts (right MCA territory and left PCA territory, respectively). New 2 mm acute white matter infarct immediately lateral to the right caudate nucleus (series 9, image 19). Background multifocal T2 FLAIR hyperintense signal abnormality within the cerebral white matter (mild) and within the pons (moderate). Unchanged small chronic infarcts within the bilateral cerebellar hemispheres. No evidence of an intracranial mass. No extra-axial fluid collection. No midline shift. No definite pathologic intracranial enhancement. Vascular: Maintained flow voids within the proximal large arterial vessels. Skull and upper cervical spine: Heterogeneous bone marrow signal with numerous foci of osseous enhancement. These findings are compatible with widespread multiple myeloma.  Incompletely assessed cervical spondylosis. Sinuses/Orbits: No mass or acute finding within the imaged orbits. No significant paranasal sinus disease. Other: Trace fluid within the bilateral mastoid air cells. MRA HEAD FINDINGS Mildly motion degraded exam. Anterior circulation: The intracranial internal carotid arteries are patent. Mild atherosclerotic irregularity of both vessels. The M1 middle cerebral arteries are patent. Atherosclerotic irregularity of the M2 and more distal MCA vessels, bilaterally. No M2 proximal branch occlusion or high-grade proximal stenosis. The anterior cerebral arteries are patent. Atherosclerotic irregularity of both vessels. Most notably, there is an apparent moderate/severe stenosis within the proximal right ACA A2 segment, which is new from the prior CTA of 12/26/2019. Posterior circulation: The intracranial vertebral arteries are patent. The basilar artery is patent. Mild atherosclerotic irregularity of the basilar artery. The posterior cerebral arteries are patent. Atherosclerotic irregularity of both vessels. Most notably, there are sites of up to moderate stenosis within left PCA branches at P2/P3 junction, progressed (series 1033, image 2) and a progressive moderate stenosis within a right PCA branch at the P2/P3 junction. A left posterior communicating artery is present. The right posterior communicating artery is diminutive or absent Anatomic variants: As described. MRI brain impression #2 and #3 will be called to the ordering clinician or representative by the Radiologist Assistant, and communication documented in the PACS or [employer]. IMPRESSION: MRI brain: 1. Intermittently motion degraded exam. 2. Foci of cortical diffusion-weighted signal abnormality along the margins of a chronic left parietal lobe infarct (within the left parietal lobe, at the left temporoparietal junction and within the lateral left occipital lobe). Subtle diffusion-weighted signal abnormality  is also suspected within the posteromedial left thalamus. These findings are suspicious for seizure-related changes. However, alternatively, these may reflect acute infarcts. 3. New 2 mm acute white matter infarct immediately lateral to the right caudate head. 4. Redemonstrated tiny subacute infarcts within the anterior right corona radiata and left occipital lobe white matter (right MCA and left PCA vascular territories, respectively). 5. Background chronic small vessel ischemic changes, as described and unchanged from the recent prior brain MRI of 06/12/2022. 6. Redemonstrated small chronic infarcts within both cerebellar hemispheres. 7. Widespread multiple myeloma. MRA head: 1. No intracranial large vessel occlusion is identified. 2. Intracranial atherosclerotic disease with multifocal stenoses, most notably as follows. 3. Moderate/severe stenosis within the right ACA proximal A2 segment, new from the prior CTA head/neck of 12/26/2019. 4. Sites of up to moderate stenosis within left PCA branches at the P2/P3 junction, progressed. 5. Moderate stenosis within a right PCA branch at the P2/P3 junction, progressed. Electronically Signed   By: Kellie Simmering D.O.   On: 06/18/2022 19:39   MR ANGIO HEAD WO CONTRAST  Result Date: 06/18/2022 CLINICAL DATA:  Provided history: Neuro deficit, acute, stroke suspected. Mental status change, unknown cause. EXAM: MRI HEAD WITHOUT AND WITH CONTRAST MRA HEAD  WITHOUT CONTRAST TECHNIQUE: Multiplanar, multi-echo pulse sequences of the brain and surrounding structures were acquired without and with intravenous contrast. Angiographic images of the Circle of Willis were acquired using MRA technique without intravenous contrast. CONTRAST:  47m GADAVIST GADOBUTROL 1 MMOL/ML IV SOLN COMPARISON:  Brain MRI 06/12/2022. CT angiogram head/neck 12/26/2019. FINDINGS: MRI HEAD FINDINGS Intermittently motion degraded examination, limiting evaluation. Most notably, the axial T2 FLAIR sequence is  moderately motion degraded, the coronal T2 and T2 FLAIR sequences through the hippocampi are mild to moderately motion degraded and the sagittal T1 weighted postcontrast sequence is moderately motion degraded. Brain: No age advanced or lobar predominant parenchymal atrophy. Redemonstrated moderate-sized chronic cortical/subcortical infarct within the left parietal lobe (posterior left MCA territory and left MCA/PCA watershed territory). Chronic hemosiderin deposition within portions of this infarct territory. New from the prior brain MRI of 06/12/2022, there is cortical diffusion-weighted signal abnormality along the margins of this chronic infarct (within the right parietal lobe, at the left temporoparietal junction and within the lateral left occipital lobe) (for instance as seen on series 9, images 17 and 23). Subtle diffusion weighted signal abnormality is also suspected within the posteromedial left thalamus (series 9, image 17). Findings are suspicious for seizure related changes, although acute infarcts are also a consideration. Redemonstrated tiny foci of diffusion-weighted signal abnormality and T2 FLAIR hyperintense signal abnormality measuring up to 3 mm within the anterior right corona radiata (series 11, image 17) and left occipital lobe white matter (series 9, image 16). These were present on the prior brain MRI of 06/12/2022, and likely reflect small subacute infarcts (right MCA territory and left PCA territory, respectively). New 2 mm acute white matter infarct immediately lateral to the right caudate nucleus (series 9, image 19). Background multifocal T2 FLAIR hyperintense signal abnormality within the cerebral white matter (mild) and within the pons (moderate). Unchanged small chronic infarcts within the bilateral cerebellar hemispheres. No evidence of an intracranial mass. No extra-axial fluid collection. No midline shift. No definite pathologic intracranial enhancement. Vascular: Maintained flow  voids within the proximal large arterial vessels. Skull and upper cervical spine: Heterogeneous bone marrow signal with numerous foci of osseous enhancement. These findings are compatible with widespread multiple myeloma. Incompletely assessed cervical spondylosis. Sinuses/Orbits: No mass or acute finding within the imaged orbits. No significant paranasal sinus disease. Other: Trace fluid within the bilateral mastoid air cells. MRA HEAD FINDINGS Mildly motion degraded exam. Anterior circulation: The intracranial internal carotid arteries are patent. Mild atherosclerotic irregularity of both vessels. The M1 middle cerebral arteries are patent. Atherosclerotic irregularity of the M2 and more distal MCA vessels, bilaterally. No M2 proximal branch occlusion or high-grade proximal stenosis. The anterior cerebral arteries are patent. Atherosclerotic irregularity of both vessels. Most notably, there is an apparent moderate/severe stenosis within the proximal right ACA A2 segment, which is new from the prior CTA of 12/26/2019. Posterior circulation: The intracranial vertebral arteries are patent. The basilar artery is patent. Mild atherosclerotic irregularity of the basilar artery. The posterior cerebral arteries are patent. Atherosclerotic irregularity of both vessels. Most notably, there are sites of up to moderate stenosis within left PCA branches at P2/P3 junction, progressed (series 1033, image 2) and a progressive moderate stenosis within a right PCA branch at the P2/P3 junction. A left posterior communicating artery is present. The right posterior communicating artery is diminutive or absent Anatomic variants: As described. MRI brain impression #2 and #3 will be called to the ordering clinician or representative by the Radiologist Assistant, and communication documented in  the PACS or [employer]. IMPRESSION: MRI brain: 1. Intermittently motion degraded exam. 2. Foci of cortical diffusion-weighted signal  abnormality along the margins of a chronic left parietal lobe infarct (within the left parietal lobe, at the left temporoparietal junction and within the lateral left occipital lobe). Subtle diffusion-weighted signal abnormality is also suspected within the posteromedial left thalamus. These findings are suspicious for seizure-related changes. However, alternatively, these may reflect acute infarcts. 3. New 2 mm acute white matter infarct immediately lateral to the right caudate head. 4. Redemonstrated tiny subacute infarcts within the anterior right corona radiata and left occipital lobe white matter (right MCA and left PCA vascular territories, respectively). 5. Background chronic small vessel ischemic changes, as described and unchanged from the recent prior brain MRI of 06/12/2022. 6. Redemonstrated small chronic infarcts within both cerebellar hemispheres. 7. Widespread multiple myeloma. MRA head: 1. No intracranial large vessel occlusion is identified. 2. Intracranial atherosclerotic disease with multifocal stenoses, most notably as follows. 3. Moderate/severe stenosis within the right ACA proximal A2 segment, new from the prior CTA head/neck of 12/26/2019. 4. Sites of up to moderate stenosis within left PCA branches at the P2/P3 junction, progressed. 5. Moderate stenosis within a right PCA branch at the P2/P3 junction, progressed. Electronically Signed   By: Kellie Simmering D.O.   On: 06/18/2022 19:39   DG Chest Port 1 View  Result Date: 06/18/2022 CLINICAL DATA:  Possible sepsis EXAM: PORTABLE CHEST 1 VIEW COMPARISON:  06/08/22 CXR FINDINGS: Right chest part with the tip at the cavoatrial junction. Unchanged cardiac and mediastinal contours. No pleural effusion. No pneumothorax. Bibasilar atelectasis. Visualized upper abdomen is unremarkable.No displaced rib fractures. IMPRESSION: Bibasilar atelectasis. Electronically Signed   By: Marin Roberts M.D.   On: 06/18/2022 12:12            LOS: 12 days     Time spent: 35 min    Emeterio Reeve, DO [facility] 06/30/2022, 2:04 PM    Dictation software may have been used to generate the above note. Typos may occur and escape review in typed/dictated notes. Please contact Dr Sheppard Coil directly for clarity if needed.  Staff may message me via secure chat in [place]  but this may not receive an immediate response,  please page me for urgent matters!  If 7PM-7AM, please contact night coverage [email or web address]

## 2022-07-01 DIAGNOSIS — R531 Weakness: Secondary | ICD-10-CM | POA: Diagnosis not present

## 2022-07-01 LAB — BASIC METABOLIC PANEL
Anion gap: 7 (ref 5–15)
BUN: 9 mg/dL (ref 6–20)
CO2: 25 mmol/L (ref 22–32)
Calcium: 7.6 mg/dL — ABNORMAL LOW (ref 8.9–10.3)
Chloride: 101 mmol/L (ref 98–111)
Creatinine, Ser: 0.57 mg/dL (ref 0.44–1.00)
GFR, Estimated: >60 mL/min (ref >60)
Glucose, Bld: 107 mg/dL — ABNORMAL HIGH (ref 70–99)
Potassium: 3.6 mmol/L (ref 3.5–5.1)
Sodium: 133 mmol/L — ABNORMAL LOW (ref 135–145)

## 2022-07-01 LAB — MAGNESIUM: Magnesium: 1.6 mg/dL — ABNORMAL LOW (ref 1.7–2.4)

## 2022-07-01 MED ORDER — MAGNESIUM CHLORIDE 64 MG PO TBEC
1.0000 | DELAYED_RELEASE_TABLET | Freq: Every day | ORAL | Status: DC
  Administered 2022-07-01 – 2022-07-11 (×11): 64 mg via ORAL
  Filled 2022-07-01 (×11): qty 1

## 2022-07-01 MED ORDER — MAGNESIUM SULFATE 2 GM/50ML IV SOLN
2.0000 g | Freq: Once | INTRAVENOUS | Status: AC
  Administered 2022-07-01: 2 g via INTRAVENOUS
  Filled 2022-07-01: qty 50

## 2022-07-01 NOTE — Progress Notes (Signed)
PROGRESS NOTE    Joanna Reid   GEX:[id number] DOB: 08/24/1965  DOA: 06/18/2022 Date of Service: 07/01/22 PCP: Lindell Spar, MD     Brief Narrative / Hospital Course:  Brief Narrative / Interim history: 56 year old female with refractory multiple myeloma status post relapse, hypertension, gout, GERD, type 2 diabetes mellitus, migraine headaches, stroke with residual aphasia presented to ED for ongoing weakness and lethargy. Last had chemotherapy 2 weeks ago. She has recently received PRBC and platelets in the cancer center. While in the ED patient had acute mental status change, unarousable with full tonic-clonic seizure lasting 2 to 3 minutes which improved with lorazepam. Transferred to [place] for further neuro evaluation and follow-up as well as continuous EEG.  She has been placed on multiple antiepileptics, now some AEDs have been discontinued or are being tapered down as below under guidance from neurology. Transitioned to oral phenobarbital, Vimpat, Keppra as of 12/23. Bit more alert 12/24 but not oriented      Consultants:  Neurology  Oncology   Procedures: None         Assesement and Plan:  Principal Problem:   Generalized weakness Active Problems:   Multiple myeloma ([place])   Hyperlipidemia   Acute on chronic anemia   Leukopenia   Stroke (cerebrum) -Left parietal lobe, 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    Chronic pain disorder   LVH (left ventricular hypertrophy)   Morbid obesity with BMI of 45.0-49.9, adult (HCC)   Drug-induced polyneuropathy (HCC)   History of CVA with residual deficit   Overactive bladder   Subclinical hypothyroidism   Hypokalemia   Grand mal seizure ([place])   Hypomagnesemia   Status epilepticus (HCC)       New Onset Partial Status Epilepticus in the setting of underlying CVA, acute metabolic encephalopathy  MRI on admission showed a 2 mm acute white matter infarct lateral to the right caudate head,  and also tiny subacute infarcts in the anterior right corona radiata and left occipital lobe white matter.   EEG was concerning for possible seizures.   Workup with a 2D echo showed normal EF 65 to 01%, grade 1 diastolic dysfunction, bubble study was negative, A1c 7.2.  neurology team is following converted convert IV antiepileptics to p.o. --> have asked pharmacy to confirm equivalent doses -->  Transitioned to oral phenobarbital, Vimpat, Keppra as of 12/23.  Continue aspirin and statin for the stroke.     Therapy is recommended CIR, she's been a bit lethargic may be more appropriate for SNF, pending med adjustment and improvement in mentation Outpatient plan: F/u with neuro in 3 months. May consider repeat MRI brain at that time if still has language deficits     Abdominal pain - secondary to nephrolithiasis(nonobstructive) Incidental liver lesions noted  Patient indicates her previous flank pain is consistent with her history of nephrolithiasis, CTA notable for nonobstructing nephrolithasis -per husband these are somewhat chronic.   Abdominal imaging incidentally showed multiple liver lesions, which will require an MRI as an outpatient.   Hypomagnesemia, hypokalemia  continue to monitor Mg improved but low today, replete again and recheck in the morning, start on po supplement     Pancytopenia  Secondary to myeloma platelets gradually improving and are overall stable.   Received a total of 3 units of packed red blood cells, last one 12/20.   Hemoglobin and platelets are stable this morning but slightly lower than yesterday Monitor CBC Transfuse as needed   Refractory  Multiple Myeloma  Followed by Dr. Delton Coombes.  Last chemo given 2 weeks prior to admit          DVT prophylaxis: SCD Pertinent IV fluids/nutrition: dysphagia 2 diet per SLP eval 12/22 Central lines / invasive devices: R chest port   Code Status: FULL CODE   Disposition: inpatient  TOC needs: CIR probably  Monday 12/25 per neurology  Barriers to discharge / significant pending items: pending CIR and transition off IV meds to po           Subjective:  Patient more alert today  Family at bedside provides history, states she still isn't sleeping well at night but more alert today than yesterday.  Denies CP/SOB.  Pain controlled.  Tolerating diet.   Family Communication: Daughter at bedside on rounds     Objective Findings:  Vitals:   06/30/22 2346 07/01/22 0343 07/01/22 0800 07/01/22 1138  BP: (!) 132/90 (!) 146/87 (!) 145/87 114/70  Pulse: 88 84 92 86  Resp: _0 Temp: 98 F (36.7 C) 97.9 F (36.6 C) 98.2 F (36.8 C) 98.2 F (36.8 C)  TempSrc: Oral Oral Oral Oral  SpO2: 100% 99% 100% 100%  Weight:      Height:        Intake/Output Summary (Last 24 hours) at 07/01/2022 1341 Last data filed at 07/01/2022 2951 Gross per 24 hour  Intake 110 ml  Output 1050 ml  Net -940 ml   Filed Weights   06/18/22 1128 06/18/22 1728 06/19/22 0500  Weight: 107 kg 109.3 kg 109.4 kg    Examination:  Constitutional:  VS as above General Appearance: alert, well-developed, well-nourished, NAD Ears, Nose, Mouth, Throat: MMM Respiratory: Normal respiratory effort No wheeze No rhonchi No rales Cardiovascular: S1/S2 normal No murmur RRR No lower extremity edema Gastrointestinal: No tenderness Musculoskeletal:  No clubbing/cyanosis of digits Neurological: No cranial nerve deficit on limited exam Oriented to "in the hospital" but not answering year, states it's January         Scheduled Medications:   acyclovir  400 mg Oral BID   allopurinol  300 mg Oral Daily   aspirin  325 mg Oral Daily   Chlorhexidine Gluconate Cloth  6 each Topical Daily   docusate sodium  100 mg Oral Daily   escitalopram  10 mg Oral Daily   fluticasone  2 spray Each Nare Daily   influenza vac split quadrivalent PF  0.5 mL Intramuscular Tomorrow-1000   lacosamide  100 mg Oral BID    levETIRAcetam  1,000 mg Oral BID   magnesium chloride  1 tablet Oral Daily   melatonin  3 mg Oral QHS   pantoprazole  40 mg Oral Daily   phenobarbital  64.8 mg Oral Daily   Followed by   Derrill Memo ON 07/04/2022] phenobarbital  32.4 mg Oral Daily   pregabalin  200 mg Oral BID   rosuvastatin  20 mg Oral Daily   sodium chloride flush  10-40 mL Intracatheter Q12H   tamsulosin  0.4 mg Oral Daily    Continuous Infusions:     PRN Medications:  acetaminophen **OR** acetaminophen, albuterol, bisacodyl, diclofenac Sodium, fentaNYL (SUBLIMAZE) injection, LORazepam, ondansetron **OR** ondansetron (ZOFRAN) IV, oxyCODONE, polyethylene glycol, polyvinyl alcohol, sodium chloride flush  Antimicrobials:  Anti-infectives (From admission, onward)    Start     Dose/Rate Route Frequency Ordered Stop   06/19/22 1200  vancomycin (VANCOREADY) IVPB 1500 mg/300 mL  Status:  Discontinued  1,500 mg 150 mL/hr over 120 Minutes Intravenous Every 24 hours 06/18/22 1152 06/18/22 1514   06/18/22 2200  acyclovir (ZOVIRAX) tablet 400 mg       Note to Pharmacy: TAKE (1) TABLET BY MOUTH TWICE DAILY.     400 mg Oral 2 times daily 06/18/22 1514     06/18/22 2000  ceFEPIme (MAXIPIME) 2 g in sodium chloride 0.9 % 100 mL IVPB  Status:  Discontinued        2 g 200 mL/hr over 30 Minutes Intravenous Every 8 hours 06/18/22 1152 06/18/22 1514   06/18/22 1200  vancomycin (VANCOREADY) IVPB 2000 mg/400 mL        2,000 mg 200 mL/hr over 120 Minutes Intravenous  Once 06/18/22 1149 06/18/22 1451   06/18/22 1145  ceFEPIme (MAXIPIME) 2 g in sodium chloride 0.9 % 100 mL IVPB        2 g 200 mL/hr over 30 Minutes Intravenous  Once 06/18/22 1144 06/18/22 1304   06/18/22 1145  metroNIDAZOLE (FLAGYL) IVPB 500 mg        500 mg 100 mL/hr over 60 Minutes Intravenous  Once 06/18/22 1144 06/18/22 1431   06/18/22 1145  vancomycin (VANCOCIN) IVPB 1000 mg/200 mL premix  Status:  Discontinued        1,000 mg 200 mL/hr over 60 Minutes  Intravenous  Once 06/18/22 1144 06/18/22 1149           Data Reviewed: I have personally reviewed following labs and imaging studies  CBC: Recent Labs  Lab 06/25/22 0518 06/25/22 2153 06/27/22 0615 06/28/22 0256 06/29/22 0319 06/30/22 0243  WBC 4.3  --  4.4 5.7 5.2 4.3  HGB 6.6* 7.8* 6.9* 8.4* 7.8* 7.6*  HCT 18.9* 22.1* 19.2* 24.4* 22.6* 23.1*  MCV 86.3  --  86.1 89.1 88.3 90.6  PLT 29*  --  36* 50* 47* 50*   Basic Metabolic Panel: Recent Labs  Lab 06/27/22 0615 06/28/22 0256 06/29/22 0319 06/30/22 0243 07/01/22 0320  NA 133* 131* 135 133* 133*  K 3.4* 3.4* 3.6 3.5 3.6  CL 103 99 104 100 101  CO2 _0 GLUCOSE 80 104* 102* 119* 107*  BUN _1 CREATININE 0.32* 0.66 0.53 0.45 0.57  CALCIUM 7.2* 7.5* 7.4* 7.6* 7.6*  MG  --  1.1* 2.5* 1.4* 1.6*   GFR: Estimated Creatinine Clearance: 93.8 mL/min (by C-G formula based on SCr of 0.57 mg/dL). Liver Function Tests: Recent Labs  Lab 06/28/22 0256 06/30/22 0243  AST 18 25  ALT 14 13  ALKPHOS 201* 197*  BILITOT 0.8 0.6  PROT 4.2* 3.8*  ALBUMIN 1.7* 1.7*   No results for input(s): "LIPASE", "AMYLASE" in the last 168 hours. No results for input(s): "AMMONIA" in the last 168 hours.  Coagulation Profile: No results for input(s): "INR", "PROTIME" in the last 168 hours. Cardiac Enzymes: No results for input(s): "CKTOTAL", "CKMB", "CKMBINDEX", "TROPONINI" in the last 168 hours. BNP (last 3 results) No results for input(s): "PROBNP" in the last 8760 hours. HbA1C: No results for input(s): "HGBA1C" in the last 72 hours. CBG: Recent Labs  Lab 06/24/22 1615 06/25/22 0804  GLUCAP 98 86   Lipid Profile: No results for input(s): "CHOL", "HDL", "LDLCALC", "TRIG", "CHOLHDL", "LDLDIRECT" in the last 72 hours. Thyroid Function Tests: No results for input(s): "TSH", "T4TOTAL", "FREET4", "T3FREE", "THYROIDAB" in the last 72 hours. Anemia Panel: No results for input(s): "VITAMINB12", "FOLATE",  "FERRITIN", "TIBC", "IRON", "RETICCTPCT" in the last  72 hours. Most Recent Urinalysis On File:     Component Value Date/Time   COLORURINE YELLOW 06/18/2022 1342   APPEARANCEUR CLOUDY (A) 06/18/2022 1342   APPEARANCEUR Cloudy (A) 05/02/2022 1113   LABSPEC 1.028 06/18/2022 1342   PHURINE 5.0 06/18/2022 1342   GLUCOSEU NEGATIVE 06/18/2022 1342   HGBUR NEGATIVE 06/18/2022 1342   BILIRUBINUR NEGATIVE 06/18/2022 1342   BILIRUBINUR Negative 05/02/2022 1113   KETONESUR 5 (A) 06/18/2022 1342   PROTEINUR 30 (A) 06/18/2022 1342   UROBILINOGEN 0.2 11/27/2021 1534   UROBILINOGEN 0.2 05/07/2012 0829   NITRITE NEGATIVE 06/18/2022 1342   LEUKOCYTESUR NEGATIVE 06/18/2022 1342   Sepsis Labs: _0 (procalcitonin:4,lacticidven:4)  No results found for this or any previous visit (from the past 240 hour(s)).       Radiology Studies: ECHOCARDIOGRAM COMPLETE BUBBLE STUDY  Result Date: 06/19/2022    ECHOCARDIOGRAM REPORT   Patient Name:   KRISINDA GIOVANNI Date of Exam: 06/19/2022 Medical Rec #:  [id number]        Height:       64.0 in Accession #:    [id number]       Weight:       241.2 lb Date of Birth:  Mar 02, 1966        BSA:          2.118 m Patient Age:    1 years         BP:           161/92 mmHg Patient Gender: F                HR:           86 bpm. Exam Location:  [facility] Procedure: 2D Echo, Cardiac Doppler, Color Doppler and Saline Contrast Bubble            Study Indications:    Stroke  History:        Patient has prior history of Echocardiogram examinations, most                 recent 01/16/2022. Stroke; Risk Factors:Hypertension, Diabetes,                 Dyslipidemia and Former Smoker. Multiple myleoma, Port-a-cath in                 place.  Sonographer:    Wenda Low Referring Phys: [id number] JAN A MANSY  Sonographer Comments: Patient is obese. IMPRESSIONS  1. Left ventricular ejection fraction, by estimation, is 60 to 65%. The left ventricle has normal function. The left ventricle  has no regional wall motion abnormalities. There is moderate asymmetric left ventricular hypertrophy of the septal segment. Left ventricular diastolic parameters are consistent with Grade I diastolic dysfunction (impaired relaxation).  2. Right ventricular systolic function is normal. The right ventricular size is normal. There is normal pulmonary artery systolic pressure. The estimated right ventricular systolic pressure is 30.8 mmHg.  3. The mitral valve is grossly normal. Trivial mitral valve regurgitation.  4. The aortic valve is tricuspid. Aortic valve regurgitation is mild. Aortic regurgitation PHT measures 603 msec.  5. Aortic dilatation noted. There is mild dilatation of the ascending aorta, measuring 41 mm.  6. The inferior vena cava is normal in size with <50% respiratory variability, suggesting right atrial pressure of 8 mmHg.  7. Agitated saline contrast bubble study was negative, with no evidence of right to left interatrial shunt. Comparison(s): Prior images reviewed side by side. LVEF remains normal range at 60-65%.  Mildly dilated ascending aorta with mild aortic regurgitation. FINDINGS  Left Ventricle: Left ventricular ejection fraction, by estimation, is 60 to 65%. The left ventricle has normal function. The left ventricle has no regional wall motion abnormalities. The left ventricular internal cavity size was normal in size. There is  moderate asymmetric left ventricular hypertrophy of the septal segment. Left ventricular diastolic parameters are consistent with Grade I diastolic dysfunction (impaired relaxation). Right Ventricle: The right ventricular size is normal. No increase in right ventricular wall thickness. Right ventricular systolic function is normal. There is normal pulmonary artery systolic pressure. The tricuspid regurgitant velocity is 2.20 m/s, and  with an assumed right atrial pressure of 8 mmHg, the estimated right ventricular systolic pressure is 03.4 mmHg. Left Atrium: Left  atrial size was normal in size. Right Atrium: Right atrial size was normal in size. Pericardium: There is no evidence of pericardial effusion. Mitral Valve: The mitral valve is grossly normal. Trivial mitral valve regurgitation. MV peak gradient, 4.1 mmHg. The mean mitral valve gradient is 2.0 mmHg. Tricuspid Valve: The tricuspid valve is grossly normal. Tricuspid valve regurgitation is trivial. Aortic Valve: The aortic valve is tricuspid. Aortic valve regurgitation is mild. Aortic regurgitation PHT measures 603 msec. Aortic valve mean gradient measures 5.0 mmHg. Aortic valve peak gradient measures 12.4 mmHg. Aortic valve area, by VTI measures 2.95 cm. Pulmonic Valve: The pulmonic valve was grossly normal. Pulmonic valve regurgitation is trivial. Aorta: The aortic root is normal in size and structure and aortic dilatation noted. There is mild dilatation of the ascending aorta, measuring 41 mm. Venous: The inferior vena cava is normal in size with less than 50% respiratory variability, suggesting right atrial pressure of 8 mmHg. IAS/Shunts: No atrial level shunt detected by color flow Doppler. Agitated saline contrast was given intravenously to evaluate for intracardiac shunting. Agitated saline contrast bubble study was negative, with no evidence of any interatrial shunt.  LEFT VENTRICLE PLAX 2D LVIDd:         5.20 cm   Diastology LVIDs:         3.30 cm   LV e' medial:    5.22 cm/s LV PW:         1.30 cm   LV E/e' medial:  15.2 LV IVS:        1.50 cm   LV e' lateral:   7.62 cm/s LVOT diam:     2.10 cm   LV E/e' lateral: 10.4 LV SV:         93 LV SV Index:   44 LVOT Area:     3.46 cm  RIGHT VENTRICLE RV Basal diam:  2.70 cm RV Mid diam:    2.20 cm RV S prime:     14.80 cm/s LEFT ATRIUM             Index        RIGHT ATRIUM           Index LA diam:        4.30 cm 2.03 cm/m   RA Area:     13.20 cm LA Vol (A2C):   62.0 ml 29.27 ml/m  RA Volume:   24.40 ml  11.52 ml/m LA Vol (A4C):   46.4 ml 21.90 ml/m LA Biplane  Vol: 54.2 ml 25.59 ml/m  AORTIC VALVE                    PULMONIC VALVE AV Area (Vmax):    2.81 cm  PV Vmax:       1.29 m/s AV Area (Vmean):   3.00 cm     PV Peak grad:  6.7 mmHg AV Area (VTI):     2.95 cm AV Vmax:           176.00 cm/s AV Vmean:          95.700 cm/s AV VTI:            0.315 m AV Peak Grad:      12.4 mmHg AV Mean Grad:      5.0 mmHg LVOT Vmax:         143.00 cm/s LVOT Vmean:        83.000 cm/s LVOT VTI:          0.268 m LVOT/AV VTI ratio: 0.85 AI PHT:            603 msec  AORTA Ao Root diam: 3.70 cm Ao Asc diam:  4.10 cm MITRAL VALVE                TRICUSPID VALVE MV Area (PHT): 5.06 cm     TR Peak grad:   19.4 mmHg MV Area VTI:   3.14 cm     TR Vmax:        220.00 cm/s MV Peak grad:  4.1 mmHg MV Mean grad:  2.0 mmHg     SHUNTS MV Vmax:       1.01 m/s     Systemic VTI:  0.27 m MV Vmean:      69.0 cm/s    Systemic Diam: 2.10 cm MV Decel Time: 150 msec MV E velocity: 79.50 cm/s MV A velocity: 114.00 cm/s MV E/A ratio:  0.70 Rozann Lesches MD Electronically signed by Rozann Lesches MD Signature Date/Time: 06/19/2022/10:34:16 AM    Final    MR BRAIN W WO CONTRAST  Result Date: 06/18/2022 CLINICAL DATA:  Provided history: Neuro deficit, acute, stroke suspected. Mental status change, unknown cause. EXAM: MRI HEAD WITHOUT AND WITH CONTRAST MRA HEAD WITHOUT CONTRAST TECHNIQUE: Multiplanar, multi-echo pulse sequences of the brain and surrounding structures were acquired without and with intravenous contrast. Angiographic images of the Circle of Willis were acquired using MRA technique without intravenous contrast. CONTRAST:  60m GADAVIST GADOBUTROL 1 MMOL/ML IV SOLN COMPARISON:  Brain MRI 06/12/2022. CT angiogram head/neck 12/26/2019. FINDINGS: MRI HEAD FINDINGS Intermittently motion degraded examination, limiting evaluation. Most notably, the axial T2 FLAIR sequence is moderately motion degraded, the coronal T2 and T2 FLAIR sequences through the hippocampi are mild to moderately motion  degraded and the sagittal T1 weighted postcontrast sequence is moderately motion degraded. Brain: No age advanced or lobar predominant parenchymal atrophy. Redemonstrated moderate-sized chronic cortical/subcortical infarct within the left parietal lobe (posterior left MCA territory and left MCA/PCA watershed territory). Chronic hemosiderin deposition within portions of this infarct territory. New from the prior brain MRI of 06/12/2022, there is cortical diffusion-weighted signal abnormality along the margins of this chronic infarct (within the right parietal lobe, at the left temporoparietal junction and within the lateral left occipital lobe) (for instance as seen on series 9, images 17 and 23). Subtle diffusion weighted signal abnormality is also suspected within the posteromedial left thalamus (series 9, image 17). Findings are suspicious for seizure related changes, although acute infarcts are also a consideration. Redemonstrated tiny foci of diffusion-weighted signal abnormality and T2 FLAIR hyperintense signal abnormality measuring up to 3 mm within the anterior right corona radiata (series 11, image 17) and left occipital lobe white  matter (series 9, image 16). These were present on the prior brain MRI of 06/12/2022, and likely reflect small subacute infarcts (right MCA territory and left PCA territory, respectively). New 2 mm acute white matter infarct immediately lateral to the right caudate nucleus (series 9, image 19). Background multifocal T2 FLAIR hyperintense signal abnormality within the cerebral white matter (mild) and within the pons (moderate). Unchanged small chronic infarcts within the bilateral cerebellar hemispheres. No evidence of an intracranial mass. No extra-axial fluid collection. No midline shift. No definite pathologic intracranial enhancement. Vascular: Maintained flow voids within the proximal large arterial vessels. Skull and upper cervical spine: Heterogeneous bone marrow signal with  numerous foci of osseous enhancement. These findings are compatible with widespread multiple myeloma. Incompletely assessed cervical spondylosis. Sinuses/Orbits: No mass or acute finding within the imaged orbits. No significant paranasal sinus disease. Other: Trace fluid within the bilateral mastoid air cells. MRA HEAD FINDINGS Mildly motion degraded exam. Anterior circulation: The intracranial internal carotid arteries are patent. Mild atherosclerotic irregularity of both vessels. The M1 middle cerebral arteries are patent. Atherosclerotic irregularity of the M2 and more distal MCA vessels, bilaterally. No M2 proximal branch occlusion or high-grade proximal stenosis. The anterior cerebral arteries are patent. Atherosclerotic irregularity of both vessels. Most notably, there is an apparent moderate/severe stenosis within the proximal right ACA A2 segment, which is new from the prior CTA of 12/26/2019. Posterior circulation: The intracranial vertebral arteries are patent. The basilar artery is patent. Mild atherosclerotic irregularity of the basilar artery. The posterior cerebral arteries are patent. Atherosclerotic irregularity of both vessels. Most notably, there are sites of up to moderate stenosis within left PCA branches at P2/P3 junction, progressed (series 1033, image 2) and a progressive moderate stenosis within a right PCA branch at the P2/P3 junction. A left posterior communicating artery is present. The right posterior communicating artery is diminutive or absent Anatomic variants: As described. MRI brain impression #2 and #3 will be called to the ordering clinician or representative by the Radiologist Assistant, and communication documented in the PACS or [employer]. IMPRESSION: MRI brain: 1. Intermittently motion degraded exam. 2. Foci of cortical diffusion-weighted signal abnormality along the margins of a chronic left parietal lobe infarct (within the left parietal lobe, at the left  temporoparietal junction and within the lateral left occipital lobe). Subtle diffusion-weighted signal abnormality is also suspected within the posteromedial left thalamus. These findings are suspicious for seizure-related changes. However, alternatively, these may reflect acute infarcts. 3. New 2 mm acute white matter infarct immediately lateral to the right caudate head. 4. Redemonstrated tiny subacute infarcts within the anterior right corona radiata and left occipital lobe white matter (right MCA and left PCA vascular territories, respectively). 5. Background chronic small vessel ischemic changes, as described and unchanged from the recent prior brain MRI of 06/12/2022. 6. Redemonstrated small chronic infarcts within both cerebellar hemispheres. 7. Widespread multiple myeloma. MRA head: 1. No intracranial large vessel occlusion is identified. 2. Intracranial atherosclerotic disease with multifocal stenoses, most notably as follows. 3. Moderate/severe stenosis within the right ACA proximal A2 segment, new from the prior CTA head/neck of 12/26/2019. 4. Sites of up to moderate stenosis within left PCA branches at the P2/P3 junction, progressed. 5. Moderate stenosis within a right PCA branch at the P2/P3 junction, progressed. Electronically Signed   By: Kellie Simmering D.O.   On: 06/18/2022 19:39   MR ANGIO HEAD WO CONTRAST  Result Date: 06/18/2022 CLINICAL DATA:  Provided history: Neuro deficit, acute, stroke suspected. Mental status change,  unknown cause. EXAM: MRI HEAD WITHOUT AND WITH CONTRAST MRA HEAD WITHOUT CONTRAST TECHNIQUE: Multiplanar, multi-echo pulse sequences of the brain and surrounding structures were acquired without and with intravenous contrast. Angiographic images of the Circle of Willis were acquired using MRA technique without intravenous contrast. CONTRAST:  25m GADAVIST GADOBUTROL 1 MMOL/ML IV SOLN COMPARISON:  Brain MRI 06/12/2022. CT angiogram head/neck 12/26/2019. FINDINGS: MRI HEAD  FINDINGS Intermittently motion degraded examination, limiting evaluation. Most notably, the axial T2 FLAIR sequence is moderately motion degraded, the coronal T2 and T2 FLAIR sequences through the hippocampi are mild to moderately motion degraded and the sagittal T1 weighted postcontrast sequence is moderately motion degraded. Brain: No age advanced or lobar predominant parenchymal atrophy. Redemonstrated moderate-sized chronic cortical/subcortical infarct within the left parietal lobe (posterior left MCA territory and left MCA/PCA watershed territory). Chronic hemosiderin deposition within portions of this infarct territory. New from the prior brain MRI of 06/12/2022, there is cortical diffusion-weighted signal abnormality along the margins of this chronic infarct (within the right parietal lobe, at the left temporoparietal junction and within the lateral left occipital lobe) (for instance as seen on series 9, images 17 and 23). Subtle diffusion weighted signal abnormality is also suspected within the posteromedial left thalamus (series 9, image 17). Findings are suspicious for seizure related changes, although acute infarcts are also a consideration. Redemonstrated tiny foci of diffusion-weighted signal abnormality and T2 FLAIR hyperintense signal abnormality measuring up to 3 mm within the anterior right corona radiata (series 11, image 17) and left occipital lobe white matter (series 9, image 16). These were present on the prior brain MRI of 06/12/2022, and likely reflect small subacute infarcts (right MCA territory and left PCA territory, respectively). New 2 mm acute white matter infarct immediately lateral to the right caudate nucleus (series 9, image 19). Background multifocal T2 FLAIR hyperintense signal abnormality within the cerebral white matter (mild) and within the pons (moderate). Unchanged small chronic infarcts within the bilateral cerebellar hemispheres. No evidence of an intracranial mass. No  extra-axial fluid collection. No midline shift. No definite pathologic intracranial enhancement. Vascular: Maintained flow voids within the proximal large arterial vessels. Skull and upper cervical spine: Heterogeneous bone marrow signal with numerous foci of osseous enhancement. These findings are compatible with widespread multiple myeloma. Incompletely assessed cervical spondylosis. Sinuses/Orbits: No mass or acute finding within the imaged orbits. No significant paranasal sinus disease. Other: Trace fluid within the bilateral mastoid air cells. MRA HEAD FINDINGS Mildly motion degraded exam. Anterior circulation: The intracranial internal carotid arteries are patent. Mild atherosclerotic irregularity of both vessels. The M1 middle cerebral arteries are patent. Atherosclerotic irregularity of the M2 and more distal MCA vessels, bilaterally. No M2 proximal branch occlusion or high-grade proximal stenosis. The anterior cerebral arteries are patent. Atherosclerotic irregularity of both vessels. Most notably, there is an apparent moderate/severe stenosis within the proximal right ACA A2 segment, which is new from the prior CTA of 12/26/2019. Posterior circulation: The intracranial vertebral arteries are patent. The basilar artery is patent. Mild atherosclerotic irregularity of the basilar artery. The posterior cerebral arteries are patent. Atherosclerotic irregularity of both vessels. Most notably, there are sites of up to moderate stenosis within left PCA branches at P2/P3 junction, progressed (series 1033, image 2) and a progressive moderate stenosis within a right PCA branch at the P2/P3 junction. A left posterior communicating artery is present. The right posterior communicating artery is diminutive or absent Anatomic variants: As described. MRI brain impression #2 and #3 will be called to the ordering  clinician or representative by the Radiologist Assistant, and communication documented in the PACS or [employer]. IMPRESSION: MRI brain: 1. Intermittently motion degraded exam. 2. Foci of cortical diffusion-weighted signal abnormality along the margins of a chronic left parietal lobe infarct (within the left parietal lobe, at the left temporoparietal junction and within the lateral left occipital lobe). Subtle diffusion-weighted signal abnormality is also suspected within the posteromedial left thalamus. These findings are suspicious for seizure-related changes. However, alternatively, these may reflect acute infarcts. 3. New 2 mm acute white matter infarct immediately lateral to the right caudate head. 4. Redemonstrated tiny subacute infarcts within the anterior right corona radiata and left occipital lobe white matter (right MCA and left PCA vascular territories, respectively). 5. Background chronic small vessel ischemic changes, as described and unchanged from the recent prior brain MRI of 06/12/2022. 6. Redemonstrated small chronic infarcts within both cerebellar hemispheres. 7. Widespread multiple myeloma. MRA head: 1. No intracranial large vessel occlusion is identified. 2. Intracranial atherosclerotic disease with multifocal stenoses, most notably as follows. 3. Moderate/severe stenosis within the right ACA proximal A2 segment, new from the prior CTA head/neck of 12/26/2019. 4. Sites of up to moderate stenosis within left PCA branches at the P2/P3 junction, progressed. 5. Moderate stenosis within a right PCA branch at the P2/P3 junction, progressed. Electronically Signed   By: Kellie Simmering D.O.   On: 06/18/2022 19:39   DG Chest Port 1 View  Result Date: 06/18/2022 CLINICAL DATA:  Possible sepsis EXAM: PORTABLE CHEST 1 VIEW COMPARISON:  06/08/22 CXR FINDINGS: Right chest part with the tip at the cavoatrial junction. Unchanged cardiac and mediastinal contours. No pleural effusion. No pneumothorax. Bibasilar atelectasis. Visualized upper abdomen is unremarkable.No displaced rib fractures. IMPRESSION:  Bibasilar atelectasis. Electronically Signed   By: Marin Roberts M.D.   On: 06/18/2022 12:12            LOS: 13 days     Emeterio Reeve, DO [facility] 07/01/2022, 1:41 PM    Dictation software may have been used to generate the above note. Typos may occur and escape review in typed/dictated notes. Please contact Dr Sheppard Coil directly for clarity if needed.  Staff may message me via secure chat in [place]  but this may not receive an immediate response,  please page me for urgent matters!  If 7PM-7AM, please contact night coverage [email or web address]

## 2022-07-01 NOTE — Plan of Care (Signed)
  Problem: Education: Goal: Knowledge of General Education information will improve Description: Including pain rating scale, medication(s)/side effects and non-pharmacologic comfort measures Outcome: Progressing   Problem: Health Behavior/Discharge Planning: Goal: Ability to manage health-related needs will improve Outcome: Progressing   Problem: Education: Goal: Knowledge of disease or condition will improve Outcome: Progressing Goal: Knowledge of secondary prevention will improve (MUST DOCUMENT ALL) Outcome: Progressing   Problem: Ischemic Stroke/TIA Tissue Perfusion: Goal: Complications of ischemic stroke/TIA will be minimized Outcome: Progressing   Problem: Self-Care: Goal: Ability to participate in self-care as condition permits will improve Outcome: Progressing   Problem: Nutrition: Goal: Risk of aspiration will decrease Outcome: Progressing   Problem: Education: Goal: Expressions of having a comfortable level of knowledge regarding the disease process will increase Outcome: Progressing   Problem: Coping: Goal: Ability to identify appropriate support needs will improve Outcome: Progressing   Problem: Health Behavior/Discharge Planning: Goal: Compliance with prescribed medication regimen will improve Outcome: Progressing   Problem: Medication: Goal: Risk for medication side effects will decrease Outcome: Progressing

## 2022-07-02 DIAGNOSIS — I63411 Cerebral infarction due to embolism of right middle cerebral artery: Secondary | ICD-10-CM | POA: Diagnosis not present

## 2022-07-02 DIAGNOSIS — R531 Weakness: Secondary | ICD-10-CM | POA: Diagnosis not present

## 2022-07-02 DIAGNOSIS — Z23 Encounter for immunization: Secondary | ICD-10-CM | POA: Diagnosis not present

## 2022-07-02 DIAGNOSIS — C9000 Multiple myeloma not having achieved remission: Secondary | ICD-10-CM | POA: Diagnosis not present

## 2022-07-02 DIAGNOSIS — Z1152 Encounter for screening for COVID-19: Secondary | ICD-10-CM | POA: Diagnosis not present

## 2022-07-02 LAB — BASIC METABOLIC PANEL
Anion gap: 8 (ref 5–15)
BUN: 9 mg/dL (ref 6–20)
CO2: 24 mmol/L (ref 22–32)
Calcium: 7.7 mg/dL — ABNORMAL LOW (ref 8.9–10.3)
Chloride: 99 mmol/L (ref 98–111)
Creatinine, Ser: 0.53 mg/dL (ref 0.44–1.00)
GFR, Estimated: >60 mL/min (ref >60)
Glucose, Bld: 109 mg/dL — ABNORMAL HIGH (ref 70–99)
Potassium: 3.9 mmol/L (ref 3.5–5.1)
Sodium: 131 mmol/L — ABNORMAL LOW (ref 135–145)

## 2022-07-02 LAB — MAGNESIUM: Magnesium: 1.5 mg/dL — ABNORMAL LOW (ref 1.7–2.4)

## 2022-07-02 MED ORDER — POLYETHYLENE GLYCOL 3350 17 G PO PACK
17.0000 g | PACK | Freq: Every day | ORAL | Status: DC
  Administered 2022-07-02 – 2022-07-03 (×2): 17 g via ORAL
  Filled 2022-07-02 (×2): qty 1

## 2022-07-02 MED ORDER — BISACODYL 10 MG RE SUPP
10.0000 mg | Freq: Every day | RECTAL | Status: DC | PRN
  Administered 2022-07-02 – 2022-07-03 (×2): 10 mg via RECTAL
  Filled 2022-07-02 (×2): qty 1

## 2022-07-02 MED ORDER — SENNOSIDES-DOCUSATE SODIUM 8.6-50 MG PO TABS
2.0000 | ORAL_TABLET | Freq: Two times a day (BID) | ORAL | Status: AC
  Administered 2022-07-02 – 2022-07-03 (×3): 2 via ORAL
  Filled 2022-07-02 (×3): qty 2

## 2022-07-02 MED ORDER — POLYETHYLENE GLYCOL 3350 17 G PO PACK: 17.0000 g | PACK | Freq: Every day | ORAL | Status: DC

## 2022-07-02 NOTE — Progress Notes (Signed)
16 beat run vtach. Pt asymptomatic.  Informed Dr. Sheppard Coil.  EKG ordered.  Will continue plan of care.  Irven [place], RN

## 2022-07-02 NOTE — Progress Notes (Signed)
Alerted by RN that on tele there was brief run Vtach on tele - strip reviewed, ordered 12 lead EKG. Pt recently had echo, low risk w/ LVEF 60-65 will not repeat echo

## 2022-07-02 NOTE — Progress Notes (Signed)
PROGRESS NOTE    Joanna Reid   ZOX:[id number] DOB: [birth year]  DOA: 06/18/2022 Date of Service: 07/02/22 PCP: Lindell Spar, MD     Brief Narrative / Hospital Course:  Brief Narrative / Interim history: 56 year old female with refractory multiple myeloma status post relapse, hypertension, gout, GERD, type 2 diabetes mellitus, migraine headaches, stroke with residual aphasia presented to ED for ongoing weakness and lethargy. Last had chemotherapy 2 weeks ago. She has recently received PRBC and platelets in the cancer center. While in the ED patient had acute mental status change, unarousable with full tonic-clonic seizure lasting 2 to 3 minutes which improved with lorazepam. Transferred to [place] for further neuro evaluation and follow-up as well as continuous EEG.  She has been placed on multiple antiepileptics, now some AEDs have been discontinued or are being tapered down as below under guidance from neurology. Transitioned to oral phenobarbital, Vimpat, Keppra as of 12/23. Bit more alert 12/24 into 12/25 but not oriented except to person, place.       Consultants:  Neurology  Oncology   Procedures: None         Assesement and Plan:  Principal Problem:   Generalized weakness Active Problems:   Multiple myeloma (HCC)   Hyperlipidemia   Acute on chronic anemia   Leukopenia   Stroke (cerebrum) -Left parietal lobe, 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    Chronic pain disorder   LVH (left ventricular hypertrophy)   Morbid obesity with BMI of 45.0-49.9, adult (HCC)   Drug-induced polyneuropathy (HCC)   History of CVA with residual deficit   Overactive bladder   Subclinical hypothyroidism   Hypokalemia   Grand mal seizure ([place])   Hypomagnesemia   Status epilepticus (HCC)       New Onset Partial Status Epilepticus in the setting of underlying CVA, acute metabolic encephalopathy  MRI on admission showed a 2 mm acute white matter  infarct lateral to the right caudate head, and also tiny subacute infarcts in the anterior right corona radiata and left occipital lobe white matter.   EEG was concerning for possible seizures.   Workup with a 2D echo showed normal EF 65 to 81%, grade 1 diastolic dysfunction, bubble study was negative, A1c 7.2.  neurology team is following converted convert IV antiepileptics to p.o. -->  Transitioned to oral phenobarbital, Vimpat, Keppra as of 12/23.  Continue aspirin and statin for stroke.     Will need rehab SNF Outpatient plan: F/u with neuro in 3 months. May consider repeat MRI brain at that time if still has language deficits     Abdominal pain - secondary to nephrolithiasis(nonobstructive) Incidental liver lesions noted  Patient indicates her previous flank pain is consistent with her history of nephrolithiasis, CTA notable for nonobstructing nephrolithasis -per husband these are somewhat chronic.   Abdominal imaging incidentally showed multiple liver lesions, which will require an MRI as an outpatient.   Hypomagnesemia, hypokalemia  continue to monitor Mg improved but low today, replete again and recheck in the morning, start on po supplement     Pancytopenia  Secondary to myeloma platelets gradually improving and are overall stable.   Received a total of 3 units of packed red blood cells, last one 12/20.   Hemoglobin and platelets are stable this morning but slightly lower than yesterday Monitor CBC Transfuse as needed   Refractory Multiple Myeloma  Followed by Dr. Delton Coombes.  Last chemo given 2 weeks prior to admit  DVT prophylaxis: SCD Pertinent IV fluids/nutrition: dysphagia 2 diet per SLP eval 12/22 Central lines / invasive devices: R chest port   Code Status: FULL CODE   Disposition: inpatient  TOC needs: SNF placement, today is Christmas so unlikely to be able to arrange anything until tomorrow earliest  Barriers to discharge / significant pending  items: placement, mentation improvement, neurology clearance for discharge, may be able to go next few days           Subjective:  Patient alert today about same as yesterday Family at bedside provides history, states she still isn't sleeping well at night but more alert to them Denies CP/SOB.  Pain controlled.  Tolerating diet.  Reports constipation   Family Communication: Husband at bedside on rounds     Objective Findings:  Vitals:   07/01/22 2355 07/02/22 0335 07/02/22 0340 07/02/22 1247  BP: 112/75  131/83 120/75  Pulse: 85  86 100  Resp: (!) _0 Temp: 97.7 F (36.5 C) 97.7 F (36.5 C) 97.7 F (36.5 C) 97.9 F (36.6 C)  TempSrc: Oral Oral Oral Oral  SpO2: 99%  97% 99%  Weight:      Height:        Intake/Output Summary (Last 24 hours) at 07/02/2022 1251 Last data filed at 07/02/2022 1053 Gross per 24 hour  Intake --  Output 1575 ml  Net -1575 ml   Filed Weights   06/18/22 1128 06/18/22 1728 06/19/22 0500  Weight: 107 kg 109.3 kg 109.4 kg    Examination:  Constitutional:  VS as above General Appearance: alert, well-developed, well-nourished, NAD Ears, Nose, Mouth, Throat: MMM Respiratory: Normal respiratory effort No wheeze No rhonchi No rales Cardiovascular: S1/S2 normal No murmur RRR No lower extremity edema Gastrointestinal: No tenderness (+)BS WNLx4  Musculoskeletal:  No clubbing/cyanosis of digits Neurological: No cranial nerve deficit on limited exam Again she is oriented to "in the hospital" but not answering year, states it's January         Scheduled Medications:   acyclovir  400 mg Oral BID   allopurinol  300 mg Oral Daily   aspirin  325 mg Oral Daily   Chlorhexidine Gluconate Cloth  6 each Topical Daily   docusate sodium  100 mg Oral Daily   escitalopram  10 mg Oral Daily   fluticasone  2 spray Each Nare Daily   influenza vac split quadrivalent PF  0.5 mL Intramuscular Tomorrow-1000   lacosamide  100 mg  Oral BID   levETIRAcetam  1,000 mg Oral BID   magnesium chloride  1 tablet Oral Daily   melatonin  3 mg Oral QHS   pantoprazole  40 mg Oral Daily   phenobarbital  64.8 mg Oral Daily   Followed by   Derrill Memo ON 07/04/2022] phenobarbital  32.4 mg Oral Daily   pregabalin  200 mg Oral BID   rosuvastatin  20 mg Oral Daily   sodium chloride flush  10-40 mL Intracatheter Q12H    Continuous Infusions:     PRN Medications:  acetaminophen **OR** acetaminophen, albuterol, bisacodyl, bisacodyl, diclofenac Sodium, fentaNYL (SUBLIMAZE) injection, LORazepam, ondansetron **OR** ondansetron (ZOFRAN) IV, oxyCODONE, polyethylene glycol, polyvinyl alcohol, sodium chloride flush  Antimicrobials:  Anti-infectives (From admission, onward)    Start     Dose/Rate Route Frequency Ordered Stop   06/19/22 1200  vancomycin (VANCOREADY) IVPB 1500 mg/300 mL  Status:  Discontinued        1,500 mg 150 mL/hr over 120 Minutes Intravenous Every 24  hours 06/18/22 1152 06/18/22 1514   06/18/22 2200  acyclovir (ZOVIRAX) tablet 400 mg       Note to Pharmacy: TAKE (1) TABLET BY MOUTH TWICE DAILY.     400 mg Oral 2 times daily 06/18/22 1514     06/18/22 2000  ceFEPIme (MAXIPIME) 2 g in sodium chloride 0.9 % 100 mL IVPB  Status:  Discontinued        2 g 200 mL/hr over 30 Minutes Intravenous Every 8 hours 06/18/22 1152 06/18/22 1514   06/18/22 1200  vancomycin (VANCOREADY) IVPB 2000 mg/400 mL        2,000 mg 200 mL/hr over 120 Minutes Intravenous  Once 06/18/22 1149 06/18/22 1451   06/18/22 1145  ceFEPIme (MAXIPIME) 2 g in sodium chloride 0.9 % 100 mL IVPB        2 g 200 mL/hr over 30 Minutes Intravenous  Once 06/18/22 1144 06/18/22 1304   06/18/22 1145  metroNIDAZOLE (FLAGYL) IVPB 500 mg        500 mg 100 mL/hr over 60 Minutes Intravenous  Once 06/18/22 1144 06/18/22 1431   06/18/22 1145  vancomycin (VANCOCIN) IVPB 1000 mg/200 mL premix  Status:  Discontinued        1,000 mg 200 mL/hr over 60 Minutes Intravenous   Once 06/18/22 1144 06/18/22 1149           Data Reviewed: I have personally reviewed following labs and imaging studies  CBC: Recent Labs  Lab 06/25/22 2153 06/27/22 0615 06/28/22 0256 06/29/22 0319 06/30/22 0243  WBC  --  4.4 5.7 5.2 4.3  HGB 7.8* 6.9* 8.4* 7.8* 7.6*  HCT 22.1* 19.2* 24.4* 22.6* 23.1*  MCV  --  86.1 89.1 88.3 90.6  PLT  --  36* 50* 47* 50*   Basic Metabolic Panel: Recent Labs  Lab 06/28/22 0256 06/29/22 0319 06/30/22 0243 07/01/22 0320 07/02/22 0455  NA 131* 135 133* 133* 131*  K 3.4* 3.6 3.5 3.6 3.9  CL 99 104 100 101 99  CO2 _0 GLUCOSE 104* 102* 119* 107* 109*  BUN _1 CREATININE 0.66 0.53 0.45 0.57 0.53  CALCIUM 7.5* 7.4* 7.6* 7.6* 7.7*  MG 1.1* 2.5* 1.4* 1.6* 1.5*   GFR: Estimated Creatinine Clearance: 93.8 mL/min (by C-G formula based on SCr of 0.53 mg/dL). Liver Function Tests: Recent Labs  Lab 06/28/22 0256 06/30/22 0243  AST 18 25  ALT 14 13  ALKPHOS 201* 197*  BILITOT 0.8 0.6  PROT 4.2* 3.8*  ALBUMIN 1.7* 1.7*   No results for input(s): "LIPASE", "AMYLASE" in the last 168 hours. No results for input(s): "AMMONIA" in the last 168 hours.  Coagulation Profile: No results for input(s): "INR", "PROTIME" in the last 168 hours. Cardiac Enzymes: No results for input(s): "CKTOTAL", "CKMB", "CKMBINDEX", "TROPONINI" in the last 168 hours. BNP (last 3 results) No results for input(s): "PROBNP" in the last 8760 hours. HbA1C: No results for input(s): "HGBA1C" in the last 72 hours. CBG: No results for input(s): "GLUCAP" in the last 168 hours.  Lipid Profile: No results for input(s): "CHOL", "HDL", "LDLCALC", "TRIG", "CHOLHDL", "LDLDIRECT" in the last 72 hours. Thyroid Function Tests: No results for input(s): "TSH", "T4TOTAL", "FREET4", "T3FREE", "THYROIDAB" in the last 72 hours. Anemia Panel: No results for input(s): "VITAMINB12", "FOLATE", "FERRITIN", "TIBC", "IRON", "RETICCTPCT" in the last 72  hours. Most Recent Urinalysis On File:     Component Value Date/Time   COLORURINE YELLOW 06/18/2022 1342  APPEARANCEUR CLOUDY (A) 06/18/2022 1342   APPEARANCEUR Cloudy (A) 05/02/2022 1113   LABSPEC 1.028 06/18/2022 1342   PHURINE 5.0 06/18/2022 1342   GLUCOSEU NEGATIVE 06/18/2022 1342   HGBUR NEGATIVE 06/18/2022 1342   BILIRUBINUR NEGATIVE 06/18/2022 1342   BILIRUBINUR Negative 05/02/2022 1113   KETONESUR 5 (A) 06/18/2022 1342   PROTEINUR 30 (A) 06/18/2022 1342   UROBILINOGEN 0.2 11/27/2021 1534   UROBILINOGEN 0.2 05/07/2012 0829   NITRITE NEGATIVE 06/18/2022 1342   LEUKOCYTESUR NEGATIVE 06/18/2022 1342   Sepsis Labs: _0 (procalcitonin:4,lacticidven:4)  No results found for this or any previous visit (from the past 240 hour(s)).       Radiology Studies: ECHOCARDIOGRAM COMPLETE BUBBLE STUDY  Result Date: 06/19/2022    ECHOCARDIOGRAM REPORT   Patient Name:   PATRIZIA PAULE Date of Exam: 06/19/2022 Medical Rec #:  [id number]        Height:       64.0 in Accession #:    [id number]       Weight:       241.2 lb Date of Birth:  [birth year]        BSA:          2.118 m Patient Age:    80 years         BP:           161/92 mmHg Patient Gender: F                HR:           86 bpm. Exam Location:  [facility] Procedure: 2D Echo, Cardiac Doppler, Color Doppler and Saline Contrast Bubble            Study Indications:    Stroke  History:        Patient has prior history of Echocardiogram examinations, most                 recent 01/16/2022. Stroke; Risk Factors:Hypertension, Diabetes,                 Dyslipidemia and Former Smoker. Multiple myleoma, Port-a-cath in                 place.  Sonographer:    Wenda Low Referring Phys: [id number] JAN A MANSY  Sonographer Comments: Patient is obese. IMPRESSIONS  1. Left ventricular ejection fraction, by estimation, is 60 to 65%. The left ventricle has normal function. The left ventricle has no regional wall motion abnormalities. There is  moderate asymmetric left ventricular hypertrophy of the septal segment. Left ventricular diastolic parameters are consistent with Grade I diastolic dysfunction (impaired relaxation).  2. Right ventricular systolic function is normal. The right ventricular size is normal. There is normal pulmonary artery systolic pressure. The estimated right ventricular systolic pressure is 98.9 mmHg.  3. The mitral valve is grossly normal. Trivial mitral valve regurgitation.  4. The aortic valve is tricuspid. Aortic valve regurgitation is mild. Aortic regurgitation PHT measures 603 msec.  5. Aortic dilatation noted. There is mild dilatation of the ascending aorta, measuring 41 mm.  6. The inferior vena cava is normal in size with <50% respiratory variability, suggesting right atrial pressure of 8 mmHg.  7. Agitated saline contrast bubble study was negative, with no evidence of right to left interatrial shunt. Comparison(s): Prior images reviewed side by side. LVEF remains normal range at 60-65%. Mildly dilated ascending aorta with mild aortic regurgitation. FINDINGS  Left Ventricle: Left ventricular ejection fraction, by estimation, is 60 to 65%.  The left ventricle has normal function. The left ventricle has no regional wall motion abnormalities. The left ventricular internal cavity size was normal in size. There is  moderate asymmetric left ventricular hypertrophy of the septal segment. Left ventricular diastolic parameters are consistent with Grade I diastolic dysfunction (impaired relaxation). Right Ventricle: The right ventricular size is normal. No increase in right ventricular wall thickness. Right ventricular systolic function is normal. There is normal pulmonary artery systolic pressure. The tricuspid regurgitant velocity is 2.20 m/s, and  with an assumed right atrial pressure of 8 mmHg, the estimated right ventricular systolic pressure is 47.6 mmHg. Left Atrium: Left atrial size was normal in size. Right Atrium: Right  atrial size was normal in size. Pericardium: There is no evidence of pericardial effusion. Mitral Valve: The mitral valve is grossly normal. Trivial mitral valve regurgitation. MV peak gradient, 4.1 mmHg. The mean mitral valve gradient is 2.0 mmHg. Tricuspid Valve: The tricuspid valve is grossly normal. Tricuspid valve regurgitation is trivial. Aortic Valve: The aortic valve is tricuspid. Aortic valve regurgitation is mild. Aortic regurgitation PHT measures 603 msec. Aortic valve mean gradient measures 5.0 mmHg. Aortic valve peak gradient measures 12.4 mmHg. Aortic valve area, by VTI measures 2.95 cm. Pulmonic Valve: The pulmonic valve was grossly normal. Pulmonic valve regurgitation is trivial. Aorta: The aortic root is normal in size and structure and aortic dilatation noted. There is mild dilatation of the ascending aorta, measuring 41 mm. Venous: The inferior vena cava is normal in size with less than 50% respiratory variability, suggesting right atrial pressure of 8 mmHg. IAS/Shunts: No atrial level shunt detected by color flow Doppler. Agitated saline contrast was given intravenously to evaluate for intracardiac shunting. Agitated saline contrast bubble study was negative, with no evidence of any interatrial shunt.  LEFT VENTRICLE PLAX 2D LVIDd:         5.20 cm   Diastology LVIDs:         3.30 cm   LV e' medial:    5.22 cm/s LV PW:         1.30 cm   LV E/e' medial:  15.2 LV IVS:        1.50 cm   LV e' lateral:   7.62 cm/s LVOT diam:     2.10 cm   LV E/e' lateral: 10.4 LV SV:         93 LV SV Index:   44 LVOT Area:     3.46 cm  RIGHT VENTRICLE RV Basal diam:  2.70 cm RV Mid diam:    2.20 cm RV S prime:     14.80 cm/s LEFT ATRIUM             Index        RIGHT ATRIUM           Index LA diam:        4.30 cm 2.03 cm/m   RA Area:     13.20 cm LA Vol (A2C):   62.0 ml 29.27 ml/m  RA Volume:   24.40 ml  11.52 ml/m LA Vol (A4C):   46.4 ml 21.90 ml/m LA Biplane Vol: 54.2 ml 25.59 ml/m  AORTIC VALVE                     PULMONIC VALVE AV Area (Vmax):    2.81 cm     PV Vmax:       1.29 m/s AV Area (Vmean):   3.00 cm  PV Peak grad:  6.7 mmHg AV Area (VTI):     2.95 cm AV Vmax:           176.00 cm/s AV Vmean:          95.700 cm/s AV VTI:            0.315 m AV Peak Grad:      12.4 mmHg AV Mean Grad:      5.0 mmHg LVOT Vmax:         143.00 cm/s LVOT Vmean:        83.000 cm/s LVOT VTI:          0.268 m LVOT/AV VTI ratio: 0.85 AI PHT:            603 msec  AORTA Ao Root diam: 3.70 cm Ao Asc diam:  4.10 cm MITRAL VALVE                TRICUSPID VALVE MV Area (PHT): 5.06 cm     TR Peak grad:   19.4 mmHg MV Area VTI:   3.14 cm     TR Vmax:        220.00 cm/s MV Peak grad:  4.1 mmHg MV Mean grad:  2.0 mmHg     SHUNTS MV Vmax:       1.01 m/s     Systemic VTI:  0.27 m MV Vmean:      69.0 cm/s    Systemic Diam: 2.10 cm MV Decel Time: 150 msec MV E velocity: 79.50 cm/s MV A velocity: 114.00 cm/s MV E/A ratio:  0.70 Rozann Lesches MD Electronically signed by Rozann Lesches MD Signature Date/Time: 06/19/2022/10:34:16 AM    Final    MR BRAIN W WO CONTRAST  Result Date: 06/18/2022 CLINICAL DATA:  Provided history: Neuro deficit, acute, stroke suspected. Mental status change, unknown cause. EXAM: MRI HEAD WITHOUT AND WITH CONTRAST MRA HEAD WITHOUT CONTRAST TECHNIQUE: Multiplanar, multi-echo pulse sequences of the brain and surrounding structures were acquired without and with intravenous contrast. Angiographic images of the Circle of Willis were acquired using MRA technique without intravenous contrast. CONTRAST:  29m GADAVIST GADOBUTROL 1 MMOL/ML IV SOLN COMPARISON:  Brain MRI 06/12/2022. CT angiogram head/neck 12/26/2019. FINDINGS: MRI HEAD FINDINGS Intermittently motion degraded examination, limiting evaluation. Most notably, the axial T2 FLAIR sequence is moderately motion degraded, the coronal T2 and T2 FLAIR sequences through the hippocampi are mild to moderately motion degraded and the sagittal T1 weighted postcontrast  sequence is moderately motion degraded. Brain: No age advanced or lobar predominant parenchymal atrophy. Redemonstrated moderate-sized chronic cortical/subcortical infarct within the left parietal lobe (posterior left MCA territory and left MCA/PCA watershed territory). Chronic hemosiderin deposition within portions of this infarct territory. New from the prior brain MRI of 06/12/2022, there is cortical diffusion-weighted signal abnormality along the margins of this chronic infarct (within the right parietal lobe, at the left temporoparietal junction and within the lateral left occipital lobe) (for instance as seen on series 9, images 17 and 23). Subtle diffusion weighted signal abnormality is also suspected within the posteromedial left thalamus (series 9, image 17). Findings are suspicious for seizure related changes, although acute infarcts are also a consideration. Redemonstrated tiny foci of diffusion-weighted signal abnormality and T2 FLAIR hyperintense signal abnormality measuring up to 3 mm within the anterior right corona radiata (series 11, image 17) and left occipital lobe white matter (series 9, image 16). These were present on the prior brain MRI of 06/12/2022, and likely reflect small subacute infarcts (  right MCA territory and left PCA territory, respectively). New 2 mm acute white matter infarct immediately lateral to the right caudate nucleus (series 9, image 19). Background multifocal T2 FLAIR hyperintense signal abnormality within the cerebral white matter (mild) and within the pons (moderate). Unchanged small chronic infarcts within the bilateral cerebellar hemispheres. No evidence of an intracranial mass. No extra-axial fluid collection. No midline shift. No definite pathologic intracranial enhancement. Vascular: Maintained flow voids within the proximal large arterial vessels. Skull and upper cervical spine: Heterogeneous bone marrow signal with numerous foci of osseous enhancement. These  findings are compatible with widespread multiple myeloma. Incompletely assessed cervical spondylosis. Sinuses/Orbits: No mass or acute finding within the imaged orbits. No significant paranasal sinus disease. Other: Trace fluid within the bilateral mastoid air cells. MRA HEAD FINDINGS Mildly motion degraded exam. Anterior circulation: The intracranial internal carotid arteries are patent. Mild atherosclerotic irregularity of both vessels. The M1 middle cerebral arteries are patent. Atherosclerotic irregularity of the M2 and more distal MCA vessels, bilaterally. No M2 proximal branch occlusion or high-grade proximal stenosis. The anterior cerebral arteries are patent. Atherosclerotic irregularity of both vessels. Most notably, there is an apparent moderate/severe stenosis within the proximal right ACA A2 segment, which is new from the prior CTA of 12/26/2019. Posterior circulation: The intracranial vertebral arteries are patent. The basilar artery is patent. Mild atherosclerotic irregularity of the basilar artery. The posterior cerebral arteries are patent. Atherosclerotic irregularity of both vessels. Most notably, there are sites of up to moderate stenosis within left PCA branches at P2/P3 junction, progressed (series 1033, image 2) and a progressive moderate stenosis within a right PCA branch at the P2/P3 junction. A left posterior communicating artery is present. The right posterior communicating artery is diminutive or absent Anatomic variants: As described. MRI brain impression #2 and #3 will be called to the ordering clinician or representative by the Radiologist Assistant, and communication documented in the PACS or [employer]. IMPRESSION: MRI brain: 1. Intermittently motion degraded exam. 2. Foci of cortical diffusion-weighted signal abnormality along the margins of a chronic left parietal lobe infarct (within the left parietal lobe, at the left temporoparietal junction and within the lateral left  occipital lobe). Subtle diffusion-weighted signal abnormality is also suspected within the posteromedial left thalamus. These findings are suspicious for seizure-related changes. However, alternatively, these may reflect acute infarcts. 3. New 2 mm acute white matter infarct immediately lateral to the right caudate head. 4. Redemonstrated tiny subacute infarcts within the anterior right corona radiata and left occipital lobe white matter (right MCA and left PCA vascular territories, respectively). 5. Background chronic small vessel ischemic changes, as described and unchanged from the recent prior brain MRI of 06/12/2022. 6. Redemonstrated small chronic infarcts within both cerebellar hemispheres. 7. Widespread multiple myeloma. MRA head: 1. No intracranial large vessel occlusion is identified. 2. Intracranial atherosclerotic disease with multifocal stenoses, most notably as follows. 3. Moderate/severe stenosis within the right ACA proximal A2 segment, new from the prior CTA head/neck of 12/26/2019. 4. Sites of up to moderate stenosis within left PCA branches at the P2/P3 junction, progressed. 5. Moderate stenosis within a right PCA branch at the P2/P3 junction, progressed. Electronically Signed   By: Kellie Simmering D.O.   On: 06/18/2022 19:39   MR ANGIO HEAD WO CONTRAST  Result Date: 06/18/2022 CLINICAL DATA:  Provided history: Neuro deficit, acute, stroke suspected. Mental status change, unknown cause. EXAM: MRI HEAD WITHOUT AND WITH CONTRAST MRA HEAD WITHOUT CONTRAST TECHNIQUE: Multiplanar, multi-echo pulse sequences of the brain  and surrounding structures were acquired without and with intravenous contrast. Angiographic images of the Circle of Willis were acquired using MRA technique without intravenous contrast. CONTRAST:  34m GADAVIST GADOBUTROL 1 MMOL/ML IV SOLN COMPARISON:  Brain MRI 06/12/2022. CT angiogram head/neck 12/26/2019. FINDINGS: MRI HEAD FINDINGS Intermittently motion degraded examination,  limiting evaluation. Most notably, the axial T2 FLAIR sequence is moderately motion degraded, the coronal T2 and T2 FLAIR sequences through the hippocampi are mild to moderately motion degraded and the sagittal T1 weighted postcontrast sequence is moderately motion degraded. Brain: No age advanced or lobar predominant parenchymal atrophy. Redemonstrated moderate-sized chronic cortical/subcortical infarct within the left parietal lobe (posterior left MCA territory and left MCA/PCA watershed territory). Chronic hemosiderin deposition within portions of this infarct territory. New from the prior brain MRI of 06/12/2022, there is cortical diffusion-weighted signal abnormality along the margins of this chronic infarct (within the right parietal lobe, at the left temporoparietal junction and within the lateral left occipital lobe) (for instance as seen on series 9, images 17 and 23). Subtle diffusion weighted signal abnormality is also suspected within the posteromedial left thalamus (series 9, image 17). Findings are suspicious for seizure related changes, although acute infarcts are also a consideration. Redemonstrated tiny foci of diffusion-weighted signal abnormality and T2 FLAIR hyperintense signal abnormality measuring up to 3 mm within the anterior right corona radiata (series 11, image 17) and left occipital lobe white matter (series 9, image 16). These were present on the prior brain MRI of 06/12/2022, and likely reflect small subacute infarcts (right MCA territory and left PCA territory, respectively). New 2 mm acute white matter infarct immediately lateral to the right caudate nucleus (series 9, image 19). Background multifocal T2 FLAIR hyperintense signal abnormality within the cerebral white matter (mild) and within the pons (moderate). Unchanged small chronic infarcts within the bilateral cerebellar hemispheres. No evidence of an intracranial mass. No extra-axial fluid collection. No midline shift. No  definite pathologic intracranial enhancement. Vascular: Maintained flow voids within the proximal large arterial vessels. Skull and upper cervical spine: Heterogeneous bone marrow signal with numerous foci of osseous enhancement. These findings are compatible with widespread multiple myeloma. Incompletely assessed cervical spondylosis. Sinuses/Orbits: No mass or acute finding within the imaged orbits. No significant paranasal sinus disease. Other: Trace fluid within the bilateral mastoid air cells. MRA HEAD FINDINGS Mildly motion degraded exam. Anterior circulation: The intracranial internal carotid arteries are patent. Mild atherosclerotic irregularity of both vessels. The M1 middle cerebral arteries are patent. Atherosclerotic irregularity of the M2 and more distal MCA vessels, bilaterally. No M2 proximal branch occlusion or high-grade proximal stenosis. The anterior cerebral arteries are patent. Atherosclerotic irregularity of both vessels. Most notably, there is an apparent moderate/severe stenosis within the proximal right ACA A2 segment, which is new from the prior CTA of 12/26/2019. Posterior circulation: The intracranial vertebral arteries are patent. The basilar artery is patent. Mild atherosclerotic irregularity of the basilar artery. The posterior cerebral arteries are patent. Atherosclerotic irregularity of both vessels. Most notably, there are sites of up to moderate stenosis within left PCA branches at P2/P3 junction, progressed (series 1033, image 2) and a progressive moderate stenosis within a right PCA branch at the P2/P3 junction. A left posterior communicating artery is present. The right posterior communicating artery is diminutive or absent Anatomic variants: As described. MRI brain impression #2 and #3 will be called to the ordering clinician or representative by the Radiologist Assistant, and communication documented in the PACS or [employer] IMPRESSION: MRI brain: 1. Intermittently  motion degraded exam. 2. Foci of cortical diffusion-weighted signal abnormality along the margins of a chronic left parietal lobe infarct (within the left parietal lobe, at the left temporoparietal junction and within the lateral left occipital lobe). Subtle diffusion-weighted signal abnormality is also suspected within the posteromedial left thalamus. These findings are suspicious for seizure-related changes. However, alternatively, these may reflect acute infarcts. 3. New 2 mm acute white matter infarct immediately lateral to the right caudate head. 4. Redemonstrated tiny subacute infarcts within the anterior right corona radiata and left occipital lobe white matter (right MCA and left PCA vascular territories, respectively). 5. Background chronic small vessel ischemic changes, as described and unchanged from the recent prior brain MRI of 06/12/2022. 6. Redemonstrated small chronic infarcts within both cerebellar hemispheres. 7. Widespread multiple myeloma. MRA head: 1. No intracranial large vessel occlusion is identified. 2. Intracranial atherosclerotic disease with multifocal stenoses, most notably as follows. 3. Moderate/severe stenosis within the right ACA proximal A2 segment, new from the prior CTA head/neck of 12/26/2019. 4. Sites of up to moderate stenosis within left PCA branches at the P2/P3 junction, progressed. 5. Moderate stenosis within a right PCA branch at the P2/P3 junction, progressed. Electronically Signed   By: Kellie Simmering D.O.   On: 06/18/2022 19:39   DG Chest Port 1 View  Result Date: 06/18/2022 CLINICAL DATA:  Possible sepsis EXAM: PORTABLE CHEST 1 VIEW COMPARISON:  06/08/22 CXR FINDINGS: Right chest part with the tip at the cavoatrial junction. Unchanged cardiac and mediastinal contours. No pleural effusion. No pneumothorax. Bibasilar atelectasis. Visualized upper abdomen is unremarkable.No displaced rib fractures. IMPRESSION: Bibasilar atelectasis. Electronically Signed   By: Marin Roberts M.D.   On: 06/18/2022 12:12            LOS: 14 days     Emeterio Reeve, DO [facility] 07/02/2022, 12:51 PM    Dictation software may have been used to generate the above note. Typos may occur and escape review in typed/dictated notes. Please contact Dr Sheppard Coil directly for clarity if needed.  Staff may message me via secure chat in [place]  but this may not receive an immediate response,  please page me for urgent matters!  If 7PM-7AM, please contact night coverage [email or web address]

## 2022-07-03 DIAGNOSIS — R531 Weakness: Secondary | ICD-10-CM | POA: Diagnosis not present

## 2022-07-03 LAB — BASIC METABOLIC PANEL
Anion gap: 5 (ref 5–15)
BUN: 10 mg/dL (ref 6–20)
CO2: 26 mmol/L (ref 22–32)
Calcium: 8 mg/dL — ABNORMAL LOW (ref 8.9–10.3)
Chloride: 99 mmol/L (ref 98–111)
Creatinine, Ser: 0.57 mg/dL (ref 0.44–1.00)
GFR, Estimated: >60 mL/min (ref >60)
Glucose, Bld: 118 mg/dL — ABNORMAL HIGH (ref 70–99)
Potassium: 4.1 mmol/L (ref 3.5–5.1)
Sodium: 130 mmol/L — ABNORMAL LOW (ref 135–145)

## 2022-07-03 LAB — CBC
HCT: 25.1 % — ABNORMAL LOW (ref 36.0–46.0)
Hemoglobin: 8.2 g/dL — ABNORMAL LOW (ref 12.0–15.0)
MCH: 30 pg (ref 26.0–34.0)
MCHC: 32.7 g/dL (ref 30.0–36.0)
MCV: 91.9 fL (ref 80.0–100.0)
Platelets: 58 10*3/uL — ABNORMAL LOW (ref 150–400)
RBC: 2.73 MIL/uL — ABNORMAL LOW (ref 3.87–5.11)
RDW: 19.1 % — ABNORMAL HIGH (ref 11.5–15.5)
WBC: 5.6 10*3/uL (ref 4.0–10.5)
nRBC: 0.4 % — ABNORMAL HIGH (ref 0.0–0.2)

## 2022-07-03 MED ORDER — FLEET ENEMA 7-19 GM/118ML RE ENEM: 1.0000 | ENEMA | Freq: Every day | RECTAL | Status: DC | PRN

## 2022-07-03 MED ORDER — TRAZODONE HCL 50 MG PO TABS
50.0000 mg | ORAL_TABLET | Freq: Every day | ORAL | Status: DC
  Administered 2022-07-03 – 2022-07-10 (×8): 50 mg via ORAL
  Filled 2022-07-03 (×8): qty 1

## 2022-07-03 NOTE — Progress Notes (Addendum)
PROGRESS NOTE    Joanna Reid   SPQ:[id number] DOB: 1966-05-07  DOA: 06/18/2022 Date of Service: 07/03/22 PCP: Lindell Spar, MD     Brief Narrative / Hospital Course:  Brief Narrative / Interim history: 56 year old female with refractory multiple myeloma status post relapse, hypertension, gout, GERD, type 2 diabetes mellitus, migraine headaches, stroke with residual aphasia presented to ED for ongoing weakness and lethargy. Last had chemotherapy 2 weeks ago. She has recently received PRBC and platelets in the cancer center. While in the ED patient had acute mental status change, unarousable with full tonic-clonic seizure lasting 2 to 3 minutes which improved with lorazepam. Transferred to [place] for further neuro evaluation and follow-up as well as continuous EEG.  She has been placed on multiple antiepileptics, now some AEDs have been discontinued or are being tapered down as below under guidance from neurology. Transitioned to oral phenobarbital, Vimpat, Keppra as of 12/23. Bit more alert 12/24 through 12/26 but not oriented except to person, place.       Consultants:  Neurology  Oncology   Procedures: None         Assesement and Plan:  Principal Problem:   Generalized weakness Active Problems:   Multiple myeloma (HCC)   Hyperlipidemia   Acute on chronic anemia   Leukopenia   Stroke (cerebrum) -Left parietal lobe, 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    Chronic pain disorder   LVH (left ventricular hypertrophy)   Morbid obesity with BMI of 45.0-49.9, adult (HCC)   Drug-induced polyneuropathy (HCC)   History of CVA with residual deficit   Overactive bladder   Subclinical hypothyroidism   Hypokalemia   Grand mal seizure ([place])   Hypomagnesemia   Status epilepticus (HCC)       New Onset Partial Status Epilepticus in the setting of underlying CVA, acute metabolic encephalopathy  MRI on admission showed a 2 mm acute white matter  infarct lateral to the right caudate head, and also tiny subacute infarcts in the anterior right corona radiata and left occipital lobe white matter.   EEG was concerning for possible seizures.   Workup with a 2D echo showed normal EF 65 to 33%, grade 1 diastolic dysfunction, bubble study was negative, A1c 7.2.  neurology team is following converted convert IV antiepileptics to p.o. -->  Transitioned to oral phenobarbital, Vimpat, Keppra as of 12/23.  Continue aspirin and statin for stroke.     Will need rehab SNF Outpatient plan: F/u with neuro in 3 months. May consider repeat MRI brain at that time if still has language deficits   Pt remains fairly somnolent/confused, asked neurology team to reassess meds 07/03/22 - Dr Hortense Ramal will see pt tomorrow    Abdominal pain - secondary to nephrolithiasis(nonobstructive) Incidental liver lesions noted  Patient indicates her previous flank pain is consistent with her history of nephrolithiasis, CTA notable for nonobstructing nephrolithasis -per husband these are somewhat chronic.   Abdominal imaging incidentally showed multiple liver lesions, which will require an MRI as an outpatient.   Hypomagnesemia, hypokalemia  continue to monitor Mg improved but low today, replete again and recheck in the morning, start on po supplement     Pancytopenia  Secondary to myeloma platelets gradually improving and are overall stable.   Received a total of 3 units of packed red blood cells, last one 12/20.   Hemoglobin and platelets are stable this morning but slightly lower than yesterday Monitor CBC Transfuse as needed   Refractory Multiple  Myeloma  Followed by Dr. Delton Coombes.  Last chemo given 2 weeks prior to admit          DVT prophylaxis: SCD Pertinent IV fluids/nutrition: dysphagia 2 diet per SLP eval 12/22 Central lines / invasive devices: R chest port   Code Status: FULL CODE   Disposition: inpatient  TOC needs: SNF placement Barriers to  discharge / significant pending items: placement, mentation improvement, neurology clearance for discharge, may be able to go next few days           Subjective:  Patient alert today about same as yesterday, still confused Family at bedside provides history, states she still isn't sleeping well and constipation has been an ongoing problem.   Family Communication: Daughter at bedside on rounds     Objective Findings:  Vitals:   07/02/22 2336 07/03/22 0325 07/03/22 0849 07/03/22 1320  BP: (!) 140/84 130/84 (!) 124/91 134/79  Pulse: 99 100 (!) 107 90  Resp: _0 Temp: 97.8 F (36.6 C) 98 F (36.7 C) (!) 97.3 F (36.3 C) 97.7 F (36.5 C)  TempSrc: Oral Oral Axillary   SpO2: 97% 99% 100% 100%  Weight:      Height:        Intake/Output Summary (Last 24 hours) at 07/03/2022 1355 Last data filed at 07/03/2022 0957 Gross per 24 hour  Intake 10 ml  Output 1500 ml  Net -1490 ml   Filed Weights   06/18/22 1128 06/18/22 1728 06/19/22 0500  Weight: 107 kg 109.3 kg 109.4 kg    Examination:  Constitutional:  VS as above General Appearance: alert, well-developed, well-nourished, NAD Ears, Nose, Mouth, Throat: MMM Respiratory: Normal respiratory effort No wheeze No rhonchi No rales Cardiovascular: S1/S2 normal No murmur RRR No lower extremity edema Gastrointestinal: No tenderness (+)BS WNLx4  Musculoskeletal:  No clubbing/cyanosis of digits Neurological: No cranial nerve deficit on limited exam Again she is oriented to "in the hospital" but not answering other orientation questions         Scheduled Medications:   acyclovir  400 mg Oral BID   allopurinol  300 mg Oral Daily   aspirin  325 mg Oral Daily   Chlorhexidine Gluconate Cloth  6 each Topical Daily   docusate sodium  100 mg Oral Daily   escitalopram  10 mg Oral Daily   fluticasone  2 spray Each Nare Daily   influenza vac split quadrivalent PF  0.5 mL Intramuscular Tomorrow-1000    lacosamide  100 mg Oral BID   levETIRAcetam  1,000 mg Oral BID   magnesium chloride  1 tablet Oral Daily   melatonin  3 mg Oral QHS   pantoprazole  40 mg Oral Daily   [START ON 07/04/2022] phenobarbital  32.4 mg Oral Daily   polyethylene glycol  17 g Oral Daily   pregabalin  200 mg Oral BID   rosuvastatin  20 mg Oral Daily   sodium chloride flush  10-40 mL Intracatheter Q12H    Continuous Infusions:     PRN Medications:  acetaminophen **OR** acetaminophen, albuterol, bisacodyl, diclofenac Sodium, fentaNYL (SUBLIMAZE) injection, LORazepam, ondansetron **OR** ondansetron (ZOFRAN) IV, oxyCODONE, polyvinyl alcohol, sodium chloride flush, sodium phosphate  Antimicrobials:  Anti-infectives (From admission, onward)    Start     Dose/Rate Route Frequency Ordered Stop   06/19/22 1200  vancomycin (VANCOREADY) IVPB 1500 mg/300 mL  Status:  Discontinued        1,500 mg 150 mL/hr over 120 Minutes Intravenous Every 24 hours  06/18/22 1152 06/18/22 1514   06/18/22 2200  acyclovir (ZOVIRAX) tablet 400 mg       Note to Pharmacy: TAKE (1) TABLET BY MOUTH TWICE DAILY.     400 mg Oral 2 times daily 06/18/22 1514     06/18/22 2000  ceFEPIme (MAXIPIME) 2 g in sodium chloride 0.9 % 100 mL IVPB  Status:  Discontinued        2 g 200 mL/hr over 30 Minutes Intravenous Every 8 hours 06/18/22 1152 06/18/22 1514   06/18/22 1200  vancomycin (VANCOREADY) IVPB 2000 mg/400 mL        2,000 mg 200 mL/hr over 120 Minutes Intravenous  Once 06/18/22 1149 06/18/22 1451   06/18/22 1145  ceFEPIme (MAXIPIME) 2 g in sodium chloride 0.9 % 100 mL IVPB        2 g 200 mL/hr over 30 Minutes Intravenous  Once 06/18/22 1144 06/18/22 1304   06/18/22 1145  metroNIDAZOLE (FLAGYL) IVPB 500 mg        500 mg 100 mL/hr over 60 Minutes Intravenous  Once 06/18/22 1144 06/18/22 1431   06/18/22 1145  vancomycin (VANCOCIN) IVPB 1000 mg/200 mL premix  Status:  Discontinued        1,000 mg 200 mL/hr over 60 Minutes Intravenous  Once  06/18/22 1144 06/18/22 1149           Data Reviewed: I have personally reviewed following labs and imaging studies  CBC: Recent Labs  Lab 06/27/22 0615 06/28/22 0256 06/29/22 0319 06/30/22 0243 07/03/22 0847  WBC 4.4 5.7 5.2 4.3 5.6  HGB 6.9* 8.4* 7.8* 7.6* 8.2*  HCT 19.2* 24.4* 22.6* 23.1* 25.1*  MCV 86.1 89.1 88.3 90.6 91.9  PLT 36* 50* 47* 50* 58*   Basic Metabolic Panel: Recent Labs  Lab 06/28/22 0256 06/29/22 0319 06/30/22 0243 07/01/22 0320 07/02/22 0455 07/03/22 0847  NA 131* 135 133* 133* 131* 130*  K 3.4* 3.6 3.5 3.6 3.9 4.1  CL 99 104 100 101 99 99  CO2 _0 GLUCOSE 104* 102* 119* 107* 109* 118*  BUN _1 CREATININE 0.66 0.53 0.45 0.57 0.53 0.57  CALCIUM 7.5* 7.4* 7.6* 7.6* 7.7* 8.0*  MG 1.1* 2.5* 1.4* 1.6* 1.5*  --    GFR: Estimated Creatinine Clearance: 93.8 mL/min (by C-G formula based on SCr of 0.57 mg/dL). Liver Function Tests: Recent Labs  Lab 06/28/22 0256 06/30/22 0243  AST 18 25  ALT 14 13  ALKPHOS 201* 197*  BILITOT 0.8 0.6  PROT 4.2* 3.8*  ALBUMIN 1.7* 1.7*   No results for input(s): "LIPASE", "AMYLASE" in the last 168 hours. No results for input(s): "AMMONIA" in the last 168 hours.  Coagulation Profile: No results for input(s): "INR", "PROTIME" in the last 168 hours. Cardiac Enzymes: No results for input(s): "CKTOTAL", "CKMB", "CKMBINDEX", "TROPONINI" in the last 168 hours. BNP (last 3 results) No results for input(s): "PROBNP" in the last 8760 hours. HbA1C: No results for input(s): "HGBA1C" in the last 72 hours. CBG: No results for input(s): "GLUCAP" in the last 168 hours.  Lipid Profile: No results for input(s): "CHOL", "HDL", "LDLCALC", "TRIG", "CHOLHDL", "LDLDIRECT" in the last 72 hours. Thyroid Function Tests: No results for input(s): "TSH", "T4TOTAL", "FREET4", "T3FREE", "THYROIDAB" in the last 72 hours. Anemia Panel: No results for input(s): "VITAMINB12", "FOLATE", "FERRITIN", "TIBC",  "IRON", "RETICCTPCT" in the last 72 hours. Most Recent Urinalysis On File:     Component Value Date/Time  COLORURINE YELLOW 06/18/2022 1342   APPEARANCEUR CLOUDY (A) 06/18/2022 1342   APPEARANCEUR Cloudy (A) 05/02/2022 1113   LABSPEC 1.028 06/18/2022 1342   PHURINE 5.0 06/18/2022 1342   GLUCOSEU NEGATIVE 06/18/2022 1342   HGBUR NEGATIVE 06/18/2022 1342   BILIRUBINUR NEGATIVE 06/18/2022 1342   BILIRUBINUR Negative 05/02/2022 1113   KETONESUR 5 (A) 06/18/2022 1342   PROTEINUR 30 (A) 06/18/2022 1342   UROBILINOGEN 0.2 11/27/2021 1534   UROBILINOGEN 0.2 05/07/2012 0829   NITRITE NEGATIVE 06/18/2022 1342   LEUKOCYTESUR NEGATIVE 06/18/2022 1342   Sepsis Labs: _0 (procalcitonin:4,lacticidven:4)  No results found for this or any previous visit (from the past 240 hour(s)).       Radiology Studies: ECHOCARDIOGRAM COMPLETE BUBBLE STUDY  Result Date: 06/19/2022    ECHOCARDIOGRAM REPORT   Patient Name:   DAHLIA NIFONG Date of Exam: 06/19/2022 Medical Rec #:  [id number]        Height:       64.0 in Accession #:    [id number]       Weight:       241.2 lb Date of Birth:  1965-12-19        BSA:          2.118 m Patient Age:    1 years         BP:           161/92 mmHg Patient Gender: F                HR:           86 bpm. Exam Location:  [facility] Procedure: 2D Echo, Cardiac Doppler, Color Doppler and Saline Contrast Bubble            Study Indications:    Stroke  History:        Patient has prior history of Echocardiogram examinations, most                 recent 01/16/2022. Stroke; Risk Factors:Hypertension, Diabetes,                 Dyslipidemia and Former Smoker. Multiple myleoma, Port-a-cath in                 place.  Sonographer:    Wenda Low Referring Phys: [id number] JAN A MANSY  Sonographer Comments: Patient is obese. IMPRESSIONS  1. Left ventricular ejection fraction, by estimation, is 60 to 65%. The left ventricle has normal function. The left ventricle has no regional wall  motion abnormalities. There is moderate asymmetric left ventricular hypertrophy of the septal segment. Left ventricular diastolic parameters are consistent with Grade I diastolic dysfunction (impaired relaxation).  2. Right ventricular systolic function is normal. The right ventricular size is normal. There is normal pulmonary artery systolic pressure. The estimated right ventricular systolic pressure is 80.3 mmHg.  3. The mitral valve is grossly normal. Trivial mitral valve regurgitation.  4. The aortic valve is tricuspid. Aortic valve regurgitation is mild. Aortic regurgitation PHT measures 603 msec.  5. Aortic dilatation noted. There is mild dilatation of the ascending aorta, measuring 41 mm.  6. The inferior vena cava is normal in size with <50% respiratory variability, suggesting right atrial pressure of 8 mmHg.  7. Agitated saline contrast bubble study was negative, with no evidence of right to left interatrial shunt. Comparison(s): Prior images reviewed side by side. LVEF remains normal range at 60-65%. Mildly dilated ascending aorta with mild aortic regurgitation. FINDINGS  Left Ventricle: Left ventricular ejection fraction,  by estimation, is 60 to 65%. The left ventricle has normal function. The left ventricle has no regional wall motion abnormalities. The left ventricular internal cavity size was normal in size. There is  moderate asymmetric left ventricular hypertrophy of the septal segment. Left ventricular diastolic parameters are consistent with Grade I diastolic dysfunction (impaired relaxation). Right Ventricle: The right ventricular size is normal. No increase in right ventricular wall thickness. Right ventricular systolic function is normal. There is normal pulmonary artery systolic pressure. The tricuspid regurgitant velocity is 2.20 m/s, and  with an assumed right atrial pressure of 8 mmHg, the estimated right ventricular systolic pressure is 87.8 mmHg. Left Atrium: Left atrial size was normal in  size. Right Atrium: Right atrial size was normal in size. Pericardium: There is no evidence of pericardial effusion. Mitral Valve: The mitral valve is grossly normal. Trivial mitral valve regurgitation. MV peak gradient, 4.1 mmHg. The mean mitral valve gradient is 2.0 mmHg. Tricuspid Valve: The tricuspid valve is grossly normal. Tricuspid valve regurgitation is trivial. Aortic Valve: The aortic valve is tricuspid. Aortic valve regurgitation is mild. Aortic regurgitation PHT measures 603 msec. Aortic valve mean gradient measures 5.0 mmHg. Aortic valve peak gradient measures 12.4 mmHg. Aortic valve area, by VTI measures 2.95 cm. Pulmonic Valve: The pulmonic valve was grossly normal. Pulmonic valve regurgitation is trivial. Aorta: The aortic root is normal in size and structure and aortic dilatation noted. There is mild dilatation of the ascending aorta, measuring 41 mm. Venous: The inferior vena cava is normal in size with less than 50% respiratory variability, suggesting right atrial pressure of 8 mmHg. IAS/Shunts: No atrial level shunt detected by color flow Doppler. Agitated saline contrast was given intravenously to evaluate for intracardiac shunting. Agitated saline contrast bubble study was negative, with no evidence of any interatrial shunt.  LEFT VENTRICLE PLAX 2D LVIDd:         5.20 cm   Diastology LVIDs:         3.30 cm   LV e' medial:    5.22 cm/s LV PW:         1.30 cm   LV E/e' medial:  15.2 LV IVS:        1.50 cm   LV e' lateral:   7.62 cm/s LVOT diam:     2.10 cm   LV E/e' lateral: 10.4 LV SV:         93 LV SV Index:   44 LVOT Area:     3.46 cm  RIGHT VENTRICLE RV Basal diam:  2.70 cm RV Mid diam:    2.20 cm RV S prime:     14.80 cm/s LEFT ATRIUM             Index        RIGHT ATRIUM           Index LA diam:        4.30 cm 2.03 cm/m   RA Area:     13.20 cm LA Vol (A2C):   62.0 ml 29.27 ml/m  RA Volume:   24.40 ml  11.52 ml/m LA Vol (A4C):   46.4 ml 21.90 ml/m LA Biplane Vol: 54.2 ml 25.59 ml/m   AORTIC VALVE                    PULMONIC VALVE AV Area (Vmax):    2.81 cm     PV Vmax:       1.29 m/s AV Area (Vmean):  3.00 cm     PV Peak grad:  6.7 mmHg AV Area (VTI):     2.95 cm AV Vmax:           176.00 cm/s AV Vmean:          95.700 cm/s AV VTI:            0.315 m AV Peak Grad:      12.4 mmHg AV Mean Grad:      5.0 mmHg LVOT Vmax:         143.00 cm/s LVOT Vmean:        83.000 cm/s LVOT VTI:          0.268 m LVOT/AV VTI ratio: 0.85 AI PHT:            603 msec  AORTA Ao Root diam: 3.70 cm Ao Asc diam:  4.10 cm MITRAL VALVE                TRICUSPID VALVE MV Area (PHT): 5.06 cm     TR Peak grad:   19.4 mmHg MV Area VTI:   3.14 cm     TR Vmax:        220.00 cm/s MV Peak grad:  4.1 mmHg MV Mean grad:  2.0 mmHg     SHUNTS MV Vmax:       1.01 m/s     Systemic VTI:  0.27 m MV Vmean:      69.0 cm/s    Systemic Diam: 2.10 cm MV Decel Time: 150 msec MV E velocity: 79.50 cm/s MV A velocity: 114.00 cm/s MV E/A ratio:  0.70 Rozann Lesches MD Electronically signed by Rozann Lesches MD Signature Date/Time: 06/19/2022/10:34:16 AM    Final    MR BRAIN W WO CONTRAST  Result Date: 06/18/2022 CLINICAL DATA:  Provided history: Neuro deficit, acute, stroke suspected. Mental status change, unknown cause. EXAM: MRI HEAD WITHOUT AND WITH CONTRAST MRA HEAD WITHOUT CONTRAST TECHNIQUE: Multiplanar, multi-echo pulse sequences of the brain and surrounding structures were acquired without and with intravenous contrast. Angiographic images of the Circle of Willis were acquired using MRA technique without intravenous contrast. CONTRAST:  58m GADAVIST GADOBUTROL 1 MMOL/ML IV SOLN COMPARISON:  Brain MRI 06/12/2022. CT angiogram head/neck 12/26/2019. FINDINGS: MRI HEAD FINDINGS Intermittently motion degraded examination, limiting evaluation. Most notably, the axial T2 FLAIR sequence is moderately motion degraded, the coronal T2 and T2 FLAIR sequences through the hippocampi are mild to moderately motion degraded and the sagittal T1  weighted postcontrast sequence is moderately motion degraded. Brain: No age advanced or lobar predominant parenchymal atrophy. Redemonstrated moderate-sized chronic cortical/subcortical infarct within the left parietal lobe (posterior left MCA territory and left MCA/PCA watershed territory). Chronic hemosiderin deposition within portions of this infarct territory. New from the prior brain MRI of 06/12/2022, there is cortical diffusion-weighted signal abnormality along the margins of this chronic infarct (within the right parietal lobe, at the left temporoparietal junction and within the lateral left occipital lobe) (for instance as seen on series 9, images 17 and 23). Subtle diffusion weighted signal abnormality is also suspected within the posteromedial left thalamus (series 9, image 17). Findings are suspicious for seizure related changes, although acute infarcts are also a consideration. Redemonstrated tiny foci of diffusion-weighted signal abnormality and T2 FLAIR hyperintense signal abnormality measuring up to 3 mm within the anterior right corona radiata (series 11, image 17) and left occipital lobe white matter (series 9, image 16). These were present on the prior brain MRI of 06/12/2022,  and likely reflect small subacute infarcts (right MCA territory and left PCA territory, respectively). New 2 mm acute white matter infarct immediately lateral to the right caudate nucleus (series 9, image 19). Background multifocal T2 FLAIR hyperintense signal abnormality within the cerebral white matter (mild) and within the pons (moderate). Unchanged small chronic infarcts within the bilateral cerebellar hemispheres. No evidence of an intracranial mass. No extra-axial fluid collection. No midline shift. No definite pathologic intracranial enhancement. Vascular: Maintained flow voids within the proximal large arterial vessels. Skull and upper cervical spine: Heterogeneous bone marrow signal with numerous foci of osseous  enhancement. These findings are compatible with widespread multiple myeloma. Incompletely assessed cervical spondylosis. Sinuses/Orbits: No mass or acute finding within the imaged orbits. No significant paranasal sinus disease. Other: Trace fluid within the bilateral mastoid air cells. MRA HEAD FINDINGS Mildly motion degraded exam. Anterior circulation: The intracranial internal carotid arteries are patent. Mild atherosclerotic irregularity of both vessels. The M1 middle cerebral arteries are patent. Atherosclerotic irregularity of the M2 and more distal MCA vessels, bilaterally. No M2 proximal branch occlusion or high-grade proximal stenosis. The anterior cerebral arteries are patent. Atherosclerotic irregularity of both vessels. Most notably, there is an apparent moderate/severe stenosis within the proximal right ACA A2 segment, which is new from the prior CTA of 12/26/2019. Posterior circulation: The intracranial vertebral arteries are patent. The basilar artery is patent. Mild atherosclerotic irregularity of the basilar artery. The posterior cerebral arteries are patent. Atherosclerotic irregularity of both vessels. Most notably, there are sites of up to moderate stenosis within left PCA branches at P2/P3 junction, progressed (series 1033, image 2) and a progressive moderate stenosis within a right PCA branch at the P2/P3 junction. A left posterior communicating artery is present. The right posterior communicating artery is diminutive or absent Anatomic variants: As described. MRI brain impression #2 and #3 will be called to the ordering clinician or representative by the Radiologist Assistant, and communication documented in the PACS or [employer]. IMPRESSION: MRI brain: 1. Intermittently motion degraded exam. 2. Foci of cortical diffusion-weighted signal abnormality along the margins of a chronic left parietal lobe infarct (within the left parietal lobe, at the left temporoparietal junction and within  the lateral left occipital lobe). Subtle diffusion-weighted signal abnormality is also suspected within the posteromedial left thalamus. These findings are suspicious for seizure-related changes. However, alternatively, these may reflect acute infarcts. 3. New 2 mm acute white matter infarct immediately lateral to the right caudate head. 4. Redemonstrated tiny subacute infarcts within the anterior right corona radiata and left occipital lobe white matter (right MCA and left PCA vascular territories, respectively). 5. Background chronic small vessel ischemic changes, as described and unchanged from the recent prior brain MRI of 06/12/2022. 6. Redemonstrated small chronic infarcts within both cerebellar hemispheres. 7. Widespread multiple myeloma. MRA head: 1. No intracranial large vessel occlusion is identified. 2. Intracranial atherosclerotic disease with multifocal stenoses, most notably as follows. 3. Moderate/severe stenosis within the right ACA proximal A2 segment, new from the prior CTA head/neck of 12/26/2019. 4. Sites of up to moderate stenosis within left PCA branches at the P2/P3 junction, progressed. 5. Moderate stenosis within a right PCA branch at the P2/P3 junction, progressed. Electronically Signed   By: Kellie Simmering D.O.   On: 06/18/2022 19:39   MR ANGIO HEAD WO CONTRAST  Result Date: 06/18/2022 CLINICAL DATA:  Provided history: Neuro deficit, acute, stroke suspected. Mental status change, unknown cause. EXAM: MRI HEAD WITHOUT AND WITH CONTRAST MRA HEAD WITHOUT CONTRAST TECHNIQUE: Multiplanar,  multi-echo pulse sequences of the brain and surrounding structures were acquired without and with intravenous contrast. Angiographic images of the Circle of Willis were acquired using MRA technique without intravenous contrast. CONTRAST:  15m GADAVIST GADOBUTROL 1 MMOL/ML IV SOLN COMPARISON:  Brain MRI 06/12/2022. CT angiogram head/neck 12/26/2019. FINDINGS: MRI HEAD FINDINGS Intermittently motion degraded  examination, limiting evaluation. Most notably, the axial T2 FLAIR sequence is moderately motion degraded, the coronal T2 and T2 FLAIR sequences through the hippocampi are mild to moderately motion degraded and the sagittal T1 weighted postcontrast sequence is moderately motion degraded. Brain: No age advanced or lobar predominant parenchymal atrophy. Redemonstrated moderate-sized chronic cortical/subcortical infarct within the left parietal lobe (posterior left MCA territory and left MCA/PCA watershed territory). Chronic hemosiderin deposition within portions of this infarct territory. New from the prior brain MRI of 06/12/2022, there is cortical diffusion-weighted signal abnormality along the margins of this chronic infarct (within the right parietal lobe, at the left temporoparietal junction and within the lateral left occipital lobe) (for instance as seen on series 9, images 17 and 23). Subtle diffusion weighted signal abnormality is also suspected within the posteromedial left thalamus (series 9, image 17). Findings are suspicious for seizure related changes, although acute infarcts are also a consideration. Redemonstrated tiny foci of diffusion-weighted signal abnormality and T2 FLAIR hyperintense signal abnormality measuring up to 3 mm within the anterior right corona radiata (series 11, image 17) and left occipital lobe white matter (series 9, image 16). These were present on the prior brain MRI of 06/12/2022, and likely reflect small subacute infarcts (right MCA territory and left PCA territory, respectively). New 2 mm acute white matter infarct immediately lateral to the right caudate nucleus (series 9, image 19). Background multifocal T2 FLAIR hyperintense signal abnormality within the cerebral white matter (mild) and within the pons (moderate). Unchanged small chronic infarcts within the bilateral cerebellar hemispheres. No evidence of an intracranial mass. No extra-axial fluid collection. No midline  shift. No definite pathologic intracranial enhancement. Vascular: Maintained flow voids within the proximal large arterial vessels. Skull and upper cervical spine: Heterogeneous bone marrow signal with numerous foci of osseous enhancement. These findings are compatible with widespread multiple myeloma. Incompletely assessed cervical spondylosis. Sinuses/Orbits: No mass or acute finding within the imaged orbits. No significant paranasal sinus disease. Other: Trace fluid within the bilateral mastoid air cells. MRA HEAD FINDINGS Mildly motion degraded exam. Anterior circulation: The intracranial internal carotid arteries are patent. Mild atherosclerotic irregularity of both vessels. The M1 middle cerebral arteries are patent. Atherosclerotic irregularity of the M2 and more distal MCA vessels, bilaterally. No M2 proximal branch occlusion or high-grade proximal stenosis. The anterior cerebral arteries are patent. Atherosclerotic irregularity of both vessels. Most notably, there is an apparent moderate/severe stenosis within the proximal right ACA A2 segment, which is new from the prior CTA of 12/26/2019. Posterior circulation: The intracranial vertebral arteries are patent. The basilar artery is patent. Mild atherosclerotic irregularity of the basilar artery. The posterior cerebral arteries are patent. Atherosclerotic irregularity of both vessels. Most notably, there are sites of up to moderate stenosis within left PCA branches at P2/P3 junction, progressed (series 1033, image 2) and a progressive moderate stenosis within a right PCA branch at the P2/P3 junction. A left posterior communicating artery is present. The right posterior communicating artery is diminutive or absent Anatomic variants: As described. MRI brain impression #2 and #3 will be called to the ordering clinician or representative by the Radiologist Assistant, and communication documented in the PACS or [facility]  Dashboard. IMPRESSION: MRI brain: 1.  Intermittently motion degraded exam. 2. Foci of cortical diffusion-weighted signal abnormality along the margins of a chronic left parietal lobe infarct (within the left parietal lobe, at the left temporoparietal junction and within the lateral left occipital lobe). Subtle diffusion-weighted signal abnormality is also suspected within the posteromedial left thalamus. These findings are suspicious for seizure-related changes. However, alternatively, these may reflect acute infarcts. 3. New 2 mm acute white matter infarct immediately lateral to the right caudate head. 4. Redemonstrated tiny subacute infarcts within the anterior right corona radiata and left occipital lobe white matter (right MCA and left PCA vascular territories, respectively). 5. Background chronic small vessel ischemic changes, as described and unchanged from the recent prior brain MRI of 06/12/2022. 6. Redemonstrated small chronic infarcts within both cerebellar hemispheres. 7. Widespread multiple myeloma. MRA head: 1. No intracranial large vessel occlusion is identified. 2. Intracranial atherosclerotic disease with multifocal stenoses, most notably as follows. 3. Moderate/severe stenosis within the right ACA proximal A2 segment, new from the prior CTA head/neck of 12/26/2019. 4. Sites of up to moderate stenosis within left PCA branches at the P2/P3 junction, progressed. 5. Moderate stenosis within a right PCA branch at the P2/P3 junction, progressed. Electronically Signed   By: Kellie Simmering D.O.   On: 06/18/2022 19:39   DG Chest Port 1 View  Result Date: 06/18/2022 CLINICAL DATA:  Possible sepsis EXAM: PORTABLE CHEST 1 VIEW COMPARISON:  06/08/22 CXR FINDINGS: Right chest part with the tip at the cavoatrial junction. Unchanged cardiac and mediastinal contours. No pleural effusion. No pneumothorax. Bibasilar atelectasis. Visualized upper abdomen is unremarkable.No displaced rib fractures. IMPRESSION: Bibasilar atelectasis. Electronically Signed    By: Marin Roberts M.D.   On: 06/18/2022 12:12            LOS: 15 days     Emeterio Reeve, DO [facility] 07/03/2022, 1:55 PM    Dictation software may have been used to generate the above note. Typos may occur and escape review in typed/dictated notes. Please contact Dr Sheppard Coil directly for clarity if needed.  Staff may message me via secure chat in [place]  but this may not receive an immediate response,  please page me for urgent matters!  If 7PM-7AM, please contact night coverage [email or web address]

## 2022-07-03 NOTE — Progress Notes (Signed)
Physical Therapy Treatment Patient Details Name: Joanna Reid MRN: [id number] DOB: 1966-01-22 Today's Date: 07/03/2022   History of Present Illness 56 yo female [facility] with headache and lethargy. + seizures in ED. MRI brain shows small incidental stroke in the right caudate. EEG shows Continuous L lateralized periodic discharges (high risk for seizures). Pt with multiple myeloma currently undergoing treatment last chemo 2 weeks ago.  Other PMH includes hypertension, gout, GERD, type 2 diabetes mellitus, migraine headaches, stroke with residual aphasia    PT Comments    Pt greeted supine in bed and agreeable to session with incremental progress towards acute goals. Pt able to initiate BLEs toward EOB this session and come to sitting EOB with mod assist to elevate trunk and center at EOB. Pt able to find midline balance in sitting in less time this date and maintain throughout session until last few minutes with pt endorsing fatigue and demonstrating R lateral lean. Pt with fair tolerance for anterior/posterior weight shifting in sitting for increased Wbing through BLEs with pt able to come back up to upright sitting with min assist ant<>post. Pt continues to be limited by waxing/waning alertness, global weakness, impaired balance/proprioceptive awareness and impaired communication and cognition. Current plan remains appropriate to address deficits and maximize functional independence and decrease caregiver burden. Pt continues to benefit from skilled PT services to progress toward functional mobility goals.    Recommendations for follow up therapy are one component of a multi-disciplinary discharge planning process, led by the attending physician.  Recommendations may be updated based on patient status, additional functional criteria and insurance authorization.  Follow Up Recommendations  Acute inpatient rehab (3hours/day)     Assistance Recommended at Discharge Frequent or  constant Supervision/Assistance  Patient can return home with the following Two people to help with walking and/or transfers;A lot of help with bathing/dressing/bathroom;Assistance with cooking/housework;Direct supervision/assist for medications management;Help with stairs or ramp for entrance   Equipment Recommendations  Other (comment) (defer to post acute)    Recommendations for Other Services       Precautions / Restrictions Precautions Precautions: Fall Restrictions Weight Bearing Restrictions: No     Mobility  Bed Mobility Overal bed mobility: Needs Assistance Bed Mobility: Supine to Sit, Sit to Supine, Rolling Rolling: Max assist, +2 for physical assistance (+2 to maintain sidelying)   Supine to sit: Mod assist Sit to supine: Max assist   General bed mobility comments: assist for trunk and LE management, scooting to/from EOB, boost up, and repositioning once in supine; pt is able to assist with boosting up with verbal/tactile cues for hand placement/foot placement    Transfers                   General transfer comment: deferred secondary to continued diahrrea,and current postural proprioceptive control pt is unsafe    Ambulation/Gait                   Stairs             Wheelchair Mobility    Modified Rankin (Stroke Patients Only) Modified Rankin (Stroke Patients Only) Pre-Morbid Rankin Score: No significant disability Modified Rankin: Severe disability     Balance Overall balance assessment: Needs assistance Sitting-balance support: Feet supported, Single extremity supported Sitting balance-Leahy Scale: Fair Sitting balance - Comments: Initially pt requires Mod to Min trunk assist but was able to improve to close SBA/CGA with verbal cues when pt would start to lean posteriorly or to  the L Postural control: Posterior lean, Left lateral lean                                  Cognition Arousal/Alertness: Lethargic,  Awake/alert Behavior During Therapy: Flat affect Overall Cognitive Status: Impaired/Different from baseline Area of Impairment: Attention, Following commands, Safety/judgement, Problem solving, Awareness                   Current Attention Level: Focused   Following Commands: Follows one step commands inconsistently Safety/Judgement: Decreased awareness of safety, Decreased awareness of deficits Awareness: Intellectual Problem Solving: Slow processing, Decreased initiation, Difficulty sequencing, Requires verbal cues, Requires tactile cues General Comments: pt with increased alertness at start of session, however waning as session parogressed and pt fatigued increased        Exercises General Exercises - Lower Extremity Long Arc Quad: Both, AROM, 20 reps, Seated Other Exercises Other Exercises: long axis IR/ER in supiner Other Exercises: anterior/posterior weight shifting to encourage weight bearing through BLEs with pt pushing this PTA away    General Comments        Pertinent Vitals/Pain Pain Assessment Pain Assessment: No/denies pain Pain Intervention(s): Monitored during session    Home Living                          Prior Function            PT Goals (current goals can now be found in the care plan section) Acute Rehab PT Goals PT Goal Formulation: With patient Time For Goal Achievement: 07/06/22    Frequency    Min 4X/week      PT Plan Current plan remains appropriate    Co-evaluation              AM-PAC PT "6 Clicks" Mobility   Outcome Measure  Help needed turning from your back to your side while in a flat bed without using bedrails?: A Lot Help needed moving from lying on your back to sitting on the side of a flat bed without using bedrails?: A Lot Help needed moving to and from a bed to a chair (including a wheelchair)?: Total Help needed standing up from a chair using your arms (e.g., wheelchair or bedside chair)?:  Total Help needed to walk in hospital room?: Total Help needed climbing 3-5 steps with a railing? : Total 6 Click Score: 8    End of Session   Activity Tolerance: Patient limited by fatigue Patient left: in bed;with call bell/phone within reach;with bed alarm set;with family/visitor present Nurse Communication: Mobility status PT Visit Diagnosis: Other abnormalities of gait and mobility (R26.89);Muscle weakness (generalized) (M62.81)     Time: 2836-6294 PT Time Calculation (min) (ACUTE ONLY): 36 min  Charges:  $Therapeutic Activity: 23-37 mins                    Samanth Mirkin R. PTA Acute Rehabilitation Services Office: [place] 07/03/2022, 3:53 PM

## 2022-07-03 NOTE — Progress Notes (Signed)
Inpatient Rehab Admissions Coordinator:    CIR following from a distance, Pt. Has not worked with PT/OT in a couple of days but during last session was not able to tolerate intensity of CIR. If she continues to be unable to tolerate, we will sign off after next therapy session. Please contact me any with questions.  Clemens Catholic, [place], [place] Admissions Coordinator  [phone] ([place]) [phone] (office)

## 2022-07-04 ENCOUNTER — Other Ambulatory Visit: Payer: BC Managed Care – PPO

## 2022-07-04 DIAGNOSIS — R531 Weakness: Secondary | ICD-10-CM | POA: Diagnosis not present

## 2022-07-04 NOTE — Progress Notes (Signed)
Speech Language Pathology Treatment: Dysphagia  Patient Details Name: Joanna Reid: [id number] DOB: Dec 24, 1965 Today's Date: 07/04/2022 Time: 1005-1020 SLP Time Calculation (min) (ACUTE ONLY): 15 min  Assessment / Plan / Recommendation Clinical Impression  Pt seen for dysphagia f/u tx session with limited intake d/t pt given pain medication prior to SLP arrival.  Pt has limited satiety/consumption per family report d/t pain and impaired mentation.  Pt consumed a few bites of puree/sips of thin via straw during session without s/sx of aspiration present despite slight oral holding/delay in the swallow initiation.  Mild dysarthria/deliberate speech noted during short utterances paired with neologistic paraphasias present in conversation.  Discussed continuing current diet of Dysphagia 2/thin with family members present for energy conservation purposes.  Solids were unable to be assessed d/t pt refusal/decreased satiety.  Recommend continue current diet for safety and ST will continue to f/u for diet progression/education re: safe swallow practices.     HPI HPI: Joanna Reid is a 56 year old female with refractory multiple myeloma status post relapse, hypertension, gout, GERD, type 2 diabetes mellitus, migraine headaches, stroke with residual aphasia presented to ED on 06/19/22 due to ongoing weakness and lethargy.  She reportedly last had chemotherapy 2 weeks ago.  She has recently received PRBC and platelets in the cancer center.   Her work up revealed a K of 2.5, Hg was 7.7, WBC 2.6, platelets 15.  She noted no spontaneous bleeding.      While I was evaluating patient in the ED she was noted to become acutely somnolent and then eyes rolled in back of head and she had a full tonic clonic seizure lasting about 2 to 3 mins; [facility] onRobin R Reid is a 56 year old female with refractory multiple myeloma status post relapse, hypertension, gout, GERD, type 2 diabetes mellitus, migraine  headaches, stroke with residual aphasia presented to ED today due to ongoing weakness and lethargy.  She reportedly last had chemotherapy 2 weeks ago.  She has recently received PRBC and platelets in the cancer center.   Her work up revealed a K of 2.5, Hg was 7.7, WBC 2.6, platelets 15.  She noted no spontaneous bleeding.      While I was evaluating patient in the ED she was noted to become acutely somnolent and then eyes rolled in back of head and she had a full tonic clonic seizure lasting about 2 to 3 mins.  I called for emergency lorazepam 1 mg IV and after that was given it subsided and she was in a post-ictal state. She bit her tongue and there was some bleeding from mouth.  MRI on admission showed a 2 mm acute white matter infarct lateral to the right caudate head, and also tiny subacute infarcts in the anterior right corona radiata and left occipital lobe white matter.  Pt transferred to [facility] hospital from [place] has been following for dysphagia tx/management.      SLP Plan  Continue with current plan of care      Recommendations for follow up therapy are one component of a multi-disciplinary discharge planning process, led by the attending physician.  Recommendations may be updated based on patient status, additional functional criteria and insurance authorization.    Recommendations  Diet recommendations: Dysphagia 2 (fine chop);Thin liquid Liquids provided via: Cup;Straw Medication Administration: Crushed with puree (or whole/puree) Supervision: Full supervision/cueing for compensatory strategies;Staff to assist with self feeding Compensations: Slow rate;Small sips/bites;Follow solids with liquid Postural Changes  and/or Swallow Maneuvers: Seated upright 90 degrees                Oral Care Recommendations: Oral care BID;Staff/trained caregiver to provide oral care Follow Up Recommendations: Follow physician's recommendations for discharge plan and follow  up therapies Assistance recommended at discharge: Frequent or constant Supervision/Assistance SLP Visit Diagnosis: Dysphagia, oropharyngeal phase (R13.12) Plan: Continue with current plan of care           Joanna Reid,M.S., CCC-SLP  07/04/2022, 11:31 AM

## 2022-07-04 NOTE — Progress Notes (Signed)
PROGRESS NOTE    Joanna Reid   GNF:[id number] DOB: 25-Apr-1966  DOA: 06/18/2022 Date of Service: 07/04/22 PCP: Lindell Spar, MD   Brief Narrative / Interim history: 56 year old female with refractory multiple myeloma status post relapse, hypertension, gout, GERD, type 2 diabetes mellitus, migraine headaches, stroke with residual aphasia presented to ED for ongoing weakness and lethargy. Last had chemotherapy 2 weeks ago. She has recently received PRBC and platelets in the cancer center. While in the ED patient had acute mental status change, unarousable with full tonic-clonic seizure lasting 2 to 3 minutes which improved with lorazepam. Transferred to [place] for further neuro evaluation and follow-up as well as continuous EEG.  She has been placed on multiple antiepileptics, now some AEDs have been discontinued or are being tapered down as below under guidance from neurology. Transitioned to oral phenobarbital, Vimpat, Keppra as of 12/23. Bit more alert 12/24 through 12/26 but not oriented except to person, place.    Consultants:  Neurology  Oncology   Procedures: None    Assesement and Plan:  Principal Problem:   Generalized weakness Active Problems:   Multiple myeloma (HCC)   Hyperlipidemia   Acute on chronic anemia   Leukopenia   Stroke (cerebrum) -Left parietal lobe, 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    Chronic pain disorder   LVH (left ventricular hypertrophy)   Morbid obesity with BMI of 45.0-49.9, adult (HCC)   Drug-induced polyneuropathy (HCC)   History of CVA with residual deficit   Overactive bladder   Subclinical hypothyroidism   Hypokalemia   Grand mal seizure ([place])   Hypomagnesemia   Status epilepticus (HCC)   New Onset Partial Status Epilepticus in the setting of underlying CVA, acute metabolic encephalopathy  MRI on admission showed a 2 mm acute white matter infarct lateral to the right caudate head, and also tiny subacute  infarcts in the anterior right corona radiata and left occipital lobe white matter.   EEG was concerning for possible seizures.   Workup with a 2D echo showed normal EF 65 to 84%, grade 1 diastolic dysfunction, bubble study was negative, A1c 7.2.  neurology team is following converted convert IV antiepileptics to p.o. -->  Transitioned to oral phenobarbital, Vimpat, Keppra as of 12/23.  Continue aspirin and statin for stroke.     Will need rehab SNF Outpatient plan: F/u with neuro in 3 months. May consider repeat MRI brain at that time if still has language deficits   Pt remains fairly somnolent/confused, asked neurology team to reassess meds 07/03/22 - Dr Hortense Ramal will see pt tomorrow   Abdominal pain - secondary to nephrolithiasis(nonobstructive) Incidental liver lesions noted  Patient indicates her previous flank pain is consistent with her history of nephrolithiasis, CTA notable for nonobstructing nephrolithasis -per husband these are somewhat chronic.   Abdominal imaging incidentally showed multiple liver lesions, which will require an MRI as an outpatient.   Hypomagnesemia, hypokalemia  continue to monitor Mg improved but low today, replete again and recheck in the morning, start on po supplement     Pancytopenia  Secondary to myeloma platelets gradually improving and are overall stable.   Received a total of 3 units of packed red blood cells, last one 12/20.   Hemoglobin and platelets are stable this morning but slightly lower than yesterday Monitor CBC Transfuse as needed   Refractory Multiple Myeloma  Followed by Dr. Delton Coombes.  Last chemo given 2 weeks prior to admit  DVT prophylaxis: SCD Pertinent IV  fluids/nutrition: dysphagia 2 diet per SLP eval 12/22 Central lines / invasive devices: R chest port   Code Status: FULL CODE   Disposition: inpatient  TOC needs: SNF placement Barriers to discharge / significant pending items: placement, mentation improvement, neurology  clearance for discharge, may be able to go next few days   Subjective:  Patient alert today about same as yesterday, still confused - difficulty sleeping overnight again Family at bedside provides her history, having loose stools now, previously complaining of constipation.   Family Communication: Daughter at bedside on rounds     Objective Findings:  Vitals:   07/03/22 1320 07/03/22 1547 07/03/22 2315 07/04/22 0918  BP: 134/79 (!) 124/110 136/76 118/79  Pulse: 90 (!) 102 (!) 102 (!) 106  Resp: _0 Temp: 97.7 F (36.5 C) 98.2 F (36.8 C) 98.2 F (36.8 C) 98.3 F (36.8 C)  TempSrc:  Oral Oral Axillary  SpO2: 100% 92% 97% 98%  Weight:      Height:       No intake or output data in the 24 hours ending 07/04/22 1246  Filed Weights   06/18/22 1128 06/18/22 1728 06/19/22 0500  Weight: 107 kg 109.3 kg 109.4 kg    Examination:  Constitutional:  VS as above General Appearance: alert, well-developed, well-nourished, NAD Ears, Nose, Mouth, Throat: MMM Respiratory: Normal respiratory effort No wheeze No rhonchi No rales Cardiovascular: S1/S2 normal No murmur RRR No lower extremity edema Gastrointestinal: No tenderness (+)BS WNLx4  Musculoskeletal:  No clubbing/cyanosis of digits Neurological: No cranial nerve deficit on limited exam Again she is oriented to "in the hospital" but not answering other orientation questions         Scheduled Medications:   acyclovir  400 mg Oral BID   allopurinol  300 mg Oral Daily   aspirin  325 mg Oral Daily   Chlorhexidine Gluconate Cloth  6 each Topical Daily   escitalopram  10 mg Oral Daily   fluticasone  2 spray Each Nare Daily   influenza vac split quadrivalent PF  0.5 mL Intramuscular Tomorrow-1000   lacosamide  100 mg Oral BID   levETIRAcetam  1,000 mg Oral BID   magnesium chloride  1 tablet Oral Daily   melatonin  3 mg Oral QHS   pantoprazole  40 mg Oral Daily   phenobarbital  32.4 mg Oral Daily    pregabalin  200 mg Oral BID   rosuvastatin  20 mg Oral Daily   sodium chloride flush  10-40 mL Intracatheter Q12H   traZODone  50 mg Oral QHS    Continuous Infusions:     PRN Medications:  acetaminophen **OR** acetaminophen, albuterol, bisacodyl, diclofenac Sodium, fentaNYL (SUBLIMAZE) injection, LORazepam, ondansetron **OR** ondansetron (ZOFRAN) IV, oxyCODONE, polyvinyl alcohol, sodium chloride flush, sodium phosphate  Antimicrobials:  Anti-infectives (From admission, onward)    Start     Dose/Rate Route Frequency Ordered Stop   06/19/22 1200  vancomycin (VANCOREADY) IVPB 1500 mg/300 mL  Status:  Discontinued        1,500 mg 150 mL/hr over 120 Minutes Intravenous Every 24 hours 06/18/22 1152 06/18/22 1514   06/18/22 2200  acyclovir (ZOVIRAX) tablet 400 mg       Note to Pharmacy: TAKE (1) TABLET BY MOUTH TWICE DAILY.     400 mg Oral 2 times daily 06/18/22 1514     06/18/22 2000  ceFEPIme (MAXIPIME) 2 g in sodium chloride 0.9 % 100 mL IVPB  Status:  Discontinued  2 g 200 mL/hr over 30 Minutes Intravenous Every 8 hours 06/18/22 1152 06/18/22 1514   06/18/22 1200  vancomycin (VANCOREADY) IVPB 2000 mg/400 mL        2,000 mg 200 mL/hr over 120 Minutes Intravenous  Once 06/18/22 1149 06/18/22 1451   06/18/22 1145  ceFEPIme (MAXIPIME) 2 g in sodium chloride 0.9 % 100 mL IVPB        2 g 200 mL/hr over 30 Minutes Intravenous  Once 06/18/22 1144 06/18/22 1304   06/18/22 1145  metroNIDAZOLE (FLAGYL) IVPB 500 mg        500 mg 100 mL/hr over 60 Minutes Intravenous  Once 06/18/22 1144 06/18/22 1431   06/18/22 1145  vancomycin (VANCOCIN) IVPB 1000 mg/200 mL premix  Status:  Discontinued        1,000 mg 200 mL/hr over 60 Minutes Intravenous  Once 06/18/22 1144 06/18/22 1149           Data Reviewed: I have personally reviewed following labs and imaging studies  CBC: Recent Labs  Lab 06/28/22 0256 06/29/22 0319 06/30/22 0243 07/03/22 0847  WBC 5.7 5.2 4.3 5.6  HGB 8.4*  7.8* 7.6* 8.2*  HCT 24.4* 22.6* 23.1* 25.1*  MCV 89.1 88.3 90.6 91.9  PLT 50* 47* 50* 58*    Basic Metabolic Panel: Recent Labs  Lab 06/28/22 0256 06/29/22 0319 06/30/22 0243 07/01/22 0320 07/02/22 0455 07/03/22 0847  NA 131* 135 133* 133* 131* 130*  K 3.4* 3.6 3.5 3.6 3.9 4.1  CL 99 104 100 101 99 99  CO2 _0 GLUCOSE 104* 102* 119* 107* 109* 118*  BUN _1 CREATININE 0.66 0.53 0.45 0.57 0.53 0.57  CALCIUM 7.5* 7.4* 7.6* 7.6* 7.7* 8.0*  MG 1.1* 2.5* 1.4* 1.6* 1.5*  --     GFR: Estimated Creatinine Clearance: 93.8 mL/min (by C-G formula based on SCr of 0.57 mg/dL). Liver Function Tests: Recent Labs  Lab 06/28/22 0256 06/30/22 0243  AST 18 25  ALT 14 13  ALKPHOS 201* 197*  BILITOT 0.8 0.6  PROT 4.2* 3.8*  ALBUMIN 1.7* 1.7*    No results for input(s): "LIPASE", "AMYLASE" in the last 168 hours. No results for input(s): "AMMONIA" in the last 168 hours.  Coagulation Profile: No results for input(s): "INR", "PROTIME" in the last 168 hours. Cardiac Enzymes: No results for input(s): "CKTOTAL", "CKMB", "CKMBINDEX", "TROPONINI" in the last 168 hours. BNP (last 3 results) No results for input(s): "PROBNP" in the last 8760 hours. HbA1C: No results for input(s): "HGBA1C" in the last 72 hours. CBG: No results for input(s): "GLUCAP" in the last 168 hours.  Lipid Profile: No results for input(s): "CHOL", "HDL", "LDLCALC", "TRIG", "CHOLHDL", "LDLDIRECT" in the last 72 hours. Thyroid Function Tests: No results for input(s): "TSH", "T4TOTAL", "FREET4", "T3FREE", "THYROIDAB" in the last 72 hours. Anemia Panel: No results for input(s): "VITAMINB12", "FOLATE", "FERRITIN", "TIBC", "IRON", "RETICCTPCT" in the last 72 hours. Most Recent Urinalysis On File:     Component Value Date/Time   COLORURINE YELLOW 06/18/2022 1342   APPEARANCEUR CLOUDY (A) 06/18/2022 1342   APPEARANCEUR Cloudy (A) 05/02/2022 1113   LABSPEC 1.028 06/18/2022 1342   PHURINE 5.0  06/18/2022 1342   GLUCOSEU NEGATIVE 06/18/2022 1342   HGBUR NEGATIVE 06/18/2022 1342   BILIRUBINUR NEGATIVE 06/18/2022 1342   BILIRUBINUR Negative 05/02/2022 1113   KETONESUR 5 (A) 06/18/2022 1342   PROTEINUR 30 (A) 06/18/2022 1342   UROBILINOGEN 0.2 11/27/2021 1534  UROBILINOGEN 0.2 05/07/2012 0829   NITRITE NEGATIVE 06/18/2022 1342   LEUKOCYTESUR NEGATIVE 06/18/2022 1342   Sepsis Labs: _0 (procalcitonin:4,lacticidven:4)  No results found for this or any previous visit (from the past 240 hour(s)).       Radiology Studies: ECHOCARDIOGRAM COMPLETE BUBBLE STUDY  Result Date: 06/19/2022    ECHOCARDIOGRAM REPORT   Patient Name:   VICTOIRE DEANS Date of Exam: 06/19/2022 Medical Rec #:  [id number]        Height:       64.0 in Accession #:    [id number]       Weight:       241.2 lb Date of Birth:  07/11/1965        BSA:          2.118 m Patient Age:    72 years         BP:           161/92 mmHg Patient Gender: F                HR:           86 bpm. Exam Location:  [facility] Procedure: 2D Echo, Cardiac Doppler, Color Doppler and Saline Contrast Bubble            Study Indications:    Stroke  History:        Patient has prior history of Echocardiogram examinations, most                 recent 01/16/2022. Stroke; Risk Factors:Hypertension, Diabetes,                 Dyslipidemia and Former Smoker. Multiple myleoma, Port-a-cath in                 place.  Sonographer:    Wenda Low Referring Phys: [id number] JAN A MANSY  Sonographer Comments: Patient is obese. IMPRESSIONS  1. Left ventricular ejection fraction, by estimation, is 60 to 65%. The left ventricle has normal function. The left ventricle has no regional wall motion abnormalities. There is moderate asymmetric left ventricular hypertrophy of the septal segment. Left ventricular diastolic parameters are consistent with Grade I diastolic dysfunction (impaired relaxation).  2. Right ventricular systolic function is normal. The right  ventricular size is normal. There is normal pulmonary artery systolic pressure. The estimated right ventricular systolic pressure is 24.2 mmHg.  3. The mitral valve is grossly normal. Trivial mitral valve regurgitation.  4. The aortic valve is tricuspid. Aortic valve regurgitation is mild. Aortic regurgitation PHT measures 603 msec.  5. Aortic dilatation noted. There is mild dilatation of the ascending aorta, measuring 41 mm.  6. The inferior vena cava is normal in size with <50% respiratory variability, suggesting right atrial pressure of 8 mmHg.  7. Agitated saline contrast bubble study was negative, with no evidence of right to left interatrial shunt. Comparison(s): Prior images reviewed side by side. LVEF remains normal range at 60-65%. Mildly dilated ascending aorta with mild aortic regurgitation. FINDINGS  Left Ventricle: Left ventricular ejection fraction, by estimation, is 60 to 65%. The left ventricle has normal function. The left ventricle has no regional wall motion abnormalities. The left ventricular internal cavity size was normal in size. There is  moderate asymmetric left ventricular hypertrophy of the septal segment. Left ventricular diastolic parameters are consistent with Grade I diastolic dysfunction (impaired relaxation). Right Ventricle: The right ventricular size is normal. No increase in right ventricular wall thickness. Right ventricular systolic function is  normal. There is normal pulmonary artery systolic pressure. The tricuspid regurgitant velocity is 2.20 m/s, and  with an assumed right atrial pressure of 8 mmHg, the estimated right ventricular systolic pressure is 88.9 mmHg. Left Atrium: Left atrial size was normal in size. Right Atrium: Right atrial size was normal in size. Pericardium: There is no evidence of pericardial effusion. Mitral Valve: The mitral valve is grossly normal. Trivial mitral valve regurgitation. MV peak gradient, 4.1 mmHg. The mean mitral valve gradient is 2.0 mmHg.  Tricuspid Valve: The tricuspid valve is grossly normal. Tricuspid valve regurgitation is trivial. Aortic Valve: The aortic valve is tricuspid. Aortic valve regurgitation is mild. Aortic regurgitation PHT measures 603 msec. Aortic valve mean gradient measures 5.0 mmHg. Aortic valve peak gradient measures 12.4 mmHg. Aortic valve area, by VTI measures 2.95 cm. Pulmonic Valve: The pulmonic valve was grossly normal. Pulmonic valve regurgitation is trivial. Aorta: The aortic root is normal in size and structure and aortic dilatation noted. There is mild dilatation of the ascending aorta, measuring 41 mm. Venous: The inferior vena cava is normal in size with less than 50% respiratory variability, suggesting right atrial pressure of 8 mmHg. IAS/Shunts: No atrial level shunt detected by color flow Doppler. Agitated saline contrast was given intravenously to evaluate for intracardiac shunting. Agitated saline contrast bubble study was negative, with no evidence of any interatrial shunt.  LEFT VENTRICLE PLAX 2D LVIDd:         5.20 cm   Diastology LVIDs:         3.30 cm   LV e' medial:    5.22 cm/s LV PW:         1.30 cm   LV E/e' medial:  15.2 LV IVS:        1.50 cm   LV e' lateral:   7.62 cm/s LVOT diam:     2.10 cm   LV E/e' lateral: 10.4 LV SV:         93 LV SV Index:   44 LVOT Area:     3.46 cm  RIGHT VENTRICLE RV Basal diam:  2.70 cm RV Mid diam:    2.20 cm RV S prime:     14.80 cm/s LEFT ATRIUM             Index        RIGHT ATRIUM           Index LA diam:        4.30 cm 2.03 cm/m   RA Area:     13.20 cm LA Vol (A2C):   62.0 ml 29.27 ml/m  RA Volume:   24.40 ml  11.52 ml/m LA Vol (A4C):   46.4 ml 21.90 ml/m LA Biplane Vol: 54.2 ml 25.59 ml/m  AORTIC VALVE                    PULMONIC VALVE AV Area (Vmax):    2.81 cm     PV Vmax:       1.29 m/s AV Area (Vmean):   3.00 cm     PV Peak grad:  6.7 mmHg AV Area (VTI):     2.95 cm AV Vmax:           176.00 cm/s AV Vmean:          95.700 cm/s AV VTI:            0.315 m  AV Peak Grad:      12.4 mmHg AV Mean Grad:  5.0 mmHg LVOT Vmax:         143.00 cm/s LVOT Vmean:        83.000 cm/s LVOT VTI:          0.268 m LVOT/AV VTI ratio: 0.85 AI PHT:            603 msec  AORTA Ao Root diam: 3.70 cm Ao Asc diam:  4.10 cm MITRAL VALVE                TRICUSPID VALVE MV Area (PHT): 5.06 cm     TR Peak grad:   19.4 mmHg MV Area VTI:   3.14 cm     TR Vmax:        220.00 cm/s MV Peak grad:  4.1 mmHg MV Mean grad:  2.0 mmHg     SHUNTS MV Vmax:       1.01 m/s     Systemic VTI:  0.27 m MV Vmean:      69.0 cm/s    Systemic Diam: 2.10 cm MV Decel Time: 150 msec MV E velocity: 79.50 cm/s MV A velocity: 114.00 cm/s MV E/A ratio:  0.70 Rozann Lesches MD Electronically signed by Rozann Lesches MD Signature Date/Time: 06/19/2022/10:34:16 AM    Final    MR BRAIN W WO CONTRAST  Result Date: 06/18/2022 CLINICAL DATA:  Provided history: Neuro deficit, acute, stroke suspected. Mental status change, unknown cause. EXAM: MRI HEAD WITHOUT AND WITH CONTRAST MRA HEAD WITHOUT CONTRAST TECHNIQUE: Multiplanar, multi-echo pulse sequences of the brain and surrounding structures were acquired without and with intravenous contrast. Angiographic images of the Circle of Willis were acquired using MRA technique without intravenous contrast. CONTRAST:  55m GADAVIST GADOBUTROL 1 MMOL/ML IV SOLN COMPARISON:  Brain MRI 06/12/2022. CT angiogram head/neck 12/26/2019. FINDINGS: MRI HEAD FINDINGS Intermittently motion degraded examination, limiting evaluation. Most notably, the axial T2 FLAIR sequence is moderately motion degraded, the coronal T2 and T2 FLAIR sequences through the hippocampi are mild to moderately motion degraded and the sagittal T1 weighted postcontrast sequence is moderately motion degraded. Brain: No age advanced or lobar predominant parenchymal atrophy. Redemonstrated moderate-sized chronic cortical/subcortical infarct within the left parietal lobe (posterior left MCA territory and left MCA/PCA  watershed territory). Chronic hemosiderin deposition within portions of this infarct territory. New from the prior brain MRI of 06/12/2022, there is cortical diffusion-weighted signal abnormality along the margins of this chronic infarct (within the right parietal lobe, at the left temporoparietal junction and within the lateral left occipital lobe) (for instance as seen on series 9, images 17 and 23). Subtle diffusion weighted signal abnormality is also suspected within the posteromedial left thalamus (series 9, image 17). Findings are suspicious for seizure related changes, although acute infarcts are also a consideration. Redemonstrated tiny foci of diffusion-weighted signal abnormality and T2 FLAIR hyperintense signal abnormality measuring up to 3 mm within the anterior right corona radiata (series 11, image 17) and left occipital lobe white matter (series 9, image 16). These were present on the prior brain MRI of 06/12/2022, and likely reflect small subacute infarcts (right MCA territory and left PCA territory, respectively). New 2 mm acute white matter infarct immediately lateral to the right caudate nucleus (series 9, image 19). Background multifocal T2 FLAIR hyperintense signal abnormality within the cerebral white matter (mild) and within the pons (moderate). Unchanged small chronic infarcts within the bilateral cerebellar hemispheres. No evidence of an intracranial mass. No extra-axial fluid collection. No midline shift. No definite pathologic intracranial enhancement. Vascular: Maintained flow voids  within the proximal large arterial vessels. Skull and upper cervical spine: Heterogeneous bone marrow signal with numerous foci of osseous enhancement. These findings are compatible with widespread multiple myeloma. Incompletely assessed cervical spondylosis. Sinuses/Orbits: No mass or acute finding within the imaged orbits. No significant paranasal sinus disease. Other: Trace fluid within the bilateral  mastoid air cells. MRA HEAD FINDINGS Mildly motion degraded exam. Anterior circulation: The intracranial internal carotid arteries are patent. Mild atherosclerotic irregularity of both vessels. The M1 middle cerebral arteries are patent. Atherosclerotic irregularity of the M2 and more distal MCA vessels, bilaterally. No M2 proximal branch occlusion or high-grade proximal stenosis. The anterior cerebral arteries are patent. Atherosclerotic irregularity of both vessels. Most notably, there is an apparent moderate/severe stenosis within the proximal right ACA A2 segment, which is new from the prior CTA of 12/26/2019. Posterior circulation: The intracranial vertebral arteries are patent. The basilar artery is patent. Mild atherosclerotic irregularity of the basilar artery. The posterior cerebral arteries are patent. Atherosclerotic irregularity of both vessels. Most notably, there are sites of up to moderate stenosis within left PCA branches at P2/P3 junction, progressed (series 1033, image 2) and a progressive moderate stenosis within a right PCA branch at the P2/P3 junction. A left posterior communicating artery is present. The right posterior communicating artery is diminutive or absent Anatomic variants: As described. MRI brain impression #2 and #3 will be called to the ordering clinician or representative by the Radiologist Assistant, and communication documented in the PACS or [employer]. IMPRESSION: MRI brain: 1. Intermittently motion degraded exam. 2. Foci of cortical diffusion-weighted signal abnormality along the margins of a chronic left parietal lobe infarct (within the left parietal lobe, at the left temporoparietal junction and within the lateral left occipital lobe). Subtle diffusion-weighted signal abnormality is also suspected within the posteromedial left thalamus. These findings are suspicious for seizure-related changes. However, alternatively, these may reflect acute infarcts. 3. New 2 mm  acute white matter infarct immediately lateral to the right caudate head. 4. Redemonstrated tiny subacute infarcts within the anterior right corona radiata and left occipital lobe white matter (right MCA and left PCA vascular territories, respectively). 5. Background chronic small vessel ischemic changes, as described and unchanged from the recent prior brain MRI of 06/12/2022. 6. Redemonstrated small chronic infarcts within both cerebellar hemispheres. 7. Widespread multiple myeloma. MRA head: 1. No intracranial large vessel occlusion is identified. 2. Intracranial atherosclerotic disease with multifocal stenoses, most notably as follows. 3. Moderate/severe stenosis within the right ACA proximal A2 segment, new from the prior CTA head/neck of 12/26/2019. 4. Sites of up to moderate stenosis within left PCA branches at the P2/P3 junction, progressed. 5. Moderate stenosis within a right PCA branch at the P2/P3 junction, progressed. Electronically Signed   By: Kellie Simmering D.O.   On: 06/18/2022 19:39   MR ANGIO HEAD WO CONTRAST  Result Date: 06/18/2022 CLINICAL DATA:  Provided history: Neuro deficit, acute, stroke suspected. Mental status change, unknown cause. EXAM: MRI HEAD WITHOUT AND WITH CONTRAST MRA HEAD WITHOUT CONTRAST TECHNIQUE: Multiplanar, multi-echo pulse sequences of the brain and surrounding structures were acquired without and with intravenous contrast. Angiographic images of the Circle of Willis were acquired using MRA technique without intravenous contrast. CONTRAST:  41m GADAVIST GADOBUTROL 1 MMOL/ML IV SOLN COMPARISON:  Brain MRI 06/12/2022. CT angiogram head/neck 12/26/2019. FINDINGS: MRI HEAD FINDINGS Intermittently motion degraded examination, limiting evaluation. Most notably, the axial T2 FLAIR sequence is moderately motion degraded, the coronal T2 and T2 FLAIR sequences through the hippocampi are  mild to moderately motion degraded and the sagittal T1 weighted postcontrast sequence is  moderately motion degraded. Brain: No age advanced or lobar predominant parenchymal atrophy. Redemonstrated moderate-sized chronic cortical/subcortical infarct within the left parietal lobe (posterior left MCA territory and left MCA/PCA watershed territory). Chronic hemosiderin deposition within portions of this infarct territory. New from the prior brain MRI of 06/12/2022, there is cortical diffusion-weighted signal abnormality along the margins of this chronic infarct (within the right parietal lobe, at the left temporoparietal junction and within the lateral left occipital lobe) (for instance as seen on series 9, images 17 and 23). Subtle diffusion weighted signal abnormality is also suspected within the posteromedial left thalamus (series 9, image 17). Findings are suspicious for seizure related changes, although acute infarcts are also a consideration. Redemonstrated tiny foci of diffusion-weighted signal abnormality and T2 FLAIR hyperintense signal abnormality measuring up to 3 mm within the anterior right corona radiata (series 11, image 17) and left occipital lobe white matter (series 9, image 16). These were present on the prior brain MRI of 06/12/2022, and likely reflect small subacute infarcts (right MCA territory and left PCA territory, respectively). New 2 mm acute white matter infarct immediately lateral to the right caudate nucleus (series 9, image 19). Background multifocal T2 FLAIR hyperintense signal abnormality within the cerebral white matter (mild) and within the pons (moderate). Unchanged small chronic infarcts within the bilateral cerebellar hemispheres. No evidence of an intracranial mass. No extra-axial fluid collection. No midline shift. No definite pathologic intracranial enhancement. Vascular: Maintained flow voids within the proximal large arterial vessels. Skull and upper cervical spine: Heterogeneous bone marrow signal with numerous foci of osseous enhancement. These findings are  compatible with widespread multiple myeloma. Incompletely assessed cervical spondylosis. Sinuses/Orbits: No mass or acute finding within the imaged orbits. No significant paranasal sinus disease. Other: Trace fluid within the bilateral mastoid air cells. MRA HEAD FINDINGS Mildly motion degraded exam. Anterior circulation: The intracranial internal carotid arteries are patent. Mild atherosclerotic irregularity of both vessels. The M1 middle cerebral arteries are patent. Atherosclerotic irregularity of the M2 and more distal MCA vessels, bilaterally. No M2 proximal branch occlusion or high-grade proximal stenosis. The anterior cerebral arteries are patent. Atherosclerotic irregularity of both vessels. Most notably, there is an apparent moderate/severe stenosis within the proximal right ACA A2 segment, which is new from the prior CTA of 12/26/2019. Posterior circulation: The intracranial vertebral arteries are patent. The basilar artery is patent. Mild atherosclerotic irregularity of the basilar artery. The posterior cerebral arteries are patent. Atherosclerotic irregularity of both vessels. Most notably, there are sites of up to moderate stenosis within left PCA branches at P2/P3 junction, progressed (series 1033, image 2) and a progressive moderate stenosis within a right PCA branch at the P2/P3 junction. A left posterior communicating artery is present. The right posterior communicating artery is diminutive or absent Anatomic variants: As described. MRI brain impression #2 and #3 will be called to the ordering clinician or representative by the Radiologist Assistant, and communication documented in the PACS or [employer]. IMPRESSION: MRI brain: 1. Intermittently motion degraded exam. 2. Foci of cortical diffusion-weighted signal abnormality along the margins of a chronic left parietal lobe infarct (within the left parietal lobe, at the left temporoparietal junction and within the lateral left occipital lobe).  Subtle diffusion-weighted signal abnormality is also suspected within the posteromedial left thalamus. These findings are suspicious for seizure-related changes. However, alternatively, these may reflect acute infarcts. 3. New 2 mm acute white matter infarct immediately lateral to  the right caudate head. 4. Redemonstrated tiny subacute infarcts within the anterior right corona radiata and left occipital lobe white matter (right MCA and left PCA vascular territories, respectively). 5. Background chronic small vessel ischemic changes, as described and unchanged from the recent prior brain MRI of 06/12/2022. 6. Redemonstrated small chronic infarcts within both cerebellar hemispheres. 7. Widespread multiple myeloma. MRA head: 1. No intracranial large vessel occlusion is identified. 2. Intracranial atherosclerotic disease with multifocal stenoses, most notably as follows. 3. Moderate/severe stenosis within the right ACA proximal A2 segment, new from the prior CTA head/neck of 12/26/2019. 4. Sites of up to moderate stenosis within left PCA branches at the P2/P3 junction, progressed. 5. Moderate stenosis within a right PCA branch at the P2/P3 junction, progressed. Electronically Signed   By: Kellie Simmering D.O.   On: 06/18/2022 19:39   DG Chest Port 1 View  Result Date: 06/18/2022 CLINICAL DATA:  Possible sepsis EXAM: PORTABLE CHEST 1 VIEW COMPARISON:  06/08/22 CXR FINDINGS: Right chest part with the tip at the cavoatrial junction. Unchanged cardiac and mediastinal contours. No pleural effusion. No pneumothorax. Bibasilar atelectasis. Visualized upper abdomen is unremarkable.No displaced rib fractures. IMPRESSION: Bibasilar atelectasis. Electronically Signed   By: Marin Roberts M.D.   On: 06/18/2022 12:12            LOS: 16 days     Little Ishikawa, DO [facility] 07/04/2022, 12:46 PM    Dictation software may have been used to generate the above note. Typos may occur and escape review in  typed/dictated notes. Please contact Dr Sheppard Coil directly for clarity if needed.  Staff may message me via secure chat in [place]  but this may not receive an immediate response,  please page me for urgent matters!  If 7PM-7AM, please contact night coverage [email or web address]

## 2022-07-04 NOTE — [place] FL2 (Addendum)
[place] MEDICAID FL2 LEVEL OF CARE FORM     IDENTIFICATION  Patient Name: Joanna Reid Birthdate: 1966/05/18 Sex: female Admission Date (Current Location): 06/18/2022  [facility] and [place] Number:  [employer] and Address:  The . 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[place]) 06/18/2022   Hypomagnesemia 06/18/2022   Hospital discharge follow-up 04/19/2022   Acute bronchitis 04/12/2022   Subclinical hypothyroidism 02/05/2022   Encounter for general adult medical examination with abnormal findings 02/05/2022   Vitamin D deficiency 02/05/2022   Overactive bladder 11/30/2021   Hordeolum internum of left lower eyelid 08/07/2021   Drug-induced polyneuropathy ([place]) 04/05/2021   History of CVA with residual deficit 04/05/2021   Compression fracture of L1 lumbar vertebra ([place]) 04/05/2021   Stroke (cerebrum) -Left parietal lobe, 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  12/26/2019   Hyperlipidemia 08/09/2018   Acute on chronic anemia 08/09/2018   Leukopenia 08/09/2018   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   S/P autologous bone marrow transplantation ([place]) 04/01/2018   Abnormal echocardiogram 02/04/2018   LVH (left ventricular hypertrophy) 02/04/2018   Morbid obesity with BMI of 45.0-49.9, adult ([place]) 02/04/2018   Multiple myeloma ([place]) 10/29/2017   Hypercalcemia 10/29/2017   Primary osteoarthritis of right knee  09/20/2017   Chronic pain disorder 03/21/2017   Spondylosis of lumbar spine 03/21/2017   Thoracic spondylosis without myelopathy 03/21/2017   Type 2 diabetes mellitus with other specified complication ([place]) 84/53/6468   Anxiety as acute reaction to exceptional stress 01/13/2015   Attention deficit hyperactivity disorder (ADHD), 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   Gout 01/20/2014   GERD (gastroesophageal reflux disease) 01/20/2014   Essential hypertension, benign 01/20/2014   Plantar fasciitis of left foot 09/19/2011    Orientation RESPIRATION BLADDER Height & Weight     Self  Normal Continent, External catheter Weight: 109.4 kg Height:  _0  (162.6 cm)  BEHAVIORAL SYMPTOMS/MOOD NEUROLOGICAL BOWEL NUTRITION STATUS    Convulsions/Seizures Continent Diet (Dysphagia 2 with thin liquids)  AMBULATORY STATUS COMMUNICATION OF NEEDS Skin   Extensive Assist Verbally Normal                       Personal Care Assistance Level of Assistance  Bathing, Feeding, Dressing Bathing Assistance: Maximum assistance Feeding assistance: Maximum assistance Dressing Assistance: Maximum assistance     Functional Limitations Info  Sight, Hearing, Speech Sight Info: Adequate Hearing Info: Adequate Speech Info: Impaired (aphasia)    SPECIAL CARE FACTORS FREQUENCY  PT (By licensed PT), OT (By licensed OT), Speech therapy     PT Frequency: 5x/wk OT Frequency: 5x/wk     Speech Therapy Frequency: 5x/wk      Contractures Contractures Info: Not present    Additional Factors Info  Code Status, Allergies, Psychotropic Code Status Info: Full Allergies Info: NKA Psychotropic Info: Lexapro 10 mg daily/ Vimpat 100 mg BID/ Keppra 1000 mg BID/ Melatonin 3 mg at bedtime/ Luminal  32.4 mg daily/ Lyrica 200 mg BID/ Trazadone 50 mg at bedtime         Current Medications (07/04/2022):  This is the current hospital active medication list Current Facility-Administered Medications  Medication Dose Route  Frequency Provider Last Rate Last Admin   acetaminophen (TYLENOL) tablet 650 mg  650 mg Oral Q6H PRN Wynetta Emery, Clanford L, MD   650 mg at 06/28/22 2314   Or   acetaminophen (TYLENOL) suppository 650 mg  650 mg Rectal Q6H PRN Johnson, Clanford L, MD       acyclovir (ZOVIRAX) tablet 400 mg  400 mg Oral BID Johnson, Clanford L, MD   400 mg at 07/04/22 1015   albuterol (PROVENTIL) (2.5 MG/3ML) 0.083% nebulizer solution 2.5 mg  2.5 mg Inhalation Q4H PRN Johnson, Clanford L, MD       allopurinol (ZYLOPRIM) tablet 300 mg  300 mg Oral Daily Johnson, Clanford L, MD   300 mg at 07/04/22 5631   aspirin tablet 325 mg  325 mg Oral Daily Caren Griffins, MD   325 mg at 07/04/22 4970   bisacodyl (DULCOLAX) suppository 10 mg  10 mg Rectal Daily PRN Emeterio Reeve, DO   10 mg at 07/03/22 2637   Chlorhexidine Gluconate Cloth 2 % PADS 6 each  6 each Topical Daily Wynetta Emery, Clanford L, MD   6 each at 07/03/22 8588   diclofenac Sodium (VOLTAREN) 1 % topical gel 2 g  2 g Topical QID PRN Little Ishikawa, MD       escitalopram (LEXAPRO) tablet 10 mg  10 mg Oral Daily Johnson, Clanford L, MD   10 mg at 07/04/22 0958   fentaNYL (SUBLIMAZE) injection 12.5 mcg  12.5 mcg Intravenous Q2H PRN Johnson, Clanford L, MD   12.5 mcg at 06/26/22 1833   fluticasone (FLONASE) 50 MCG/ACT nasal spray 2 spray  2 spray Each Nare Daily Johnson, Clanford L, MD   2 spray at 07/04/22 0959   influenza vac split quadrivalent PF (FLUARIX) injection 0.5 mL  0.5 mL Intramuscular Tomorrow-1000 Little Ishikawa, MD       lacosamide (VIMPAT) tablet 100 mg  100 mg Oral BID Emeterio Reeve, DO   100 mg at 07/04/22 5027   levETIRAcetam (KEPPRA) tablet 1,000 mg  1,000 mg Oral BID Emeterio Reeve, DO   1,000 mg at 07/04/22 7412   LORazepam (ATIVAN) injection 1 mg  1 mg Intravenous Q3H PRN Wynetta Emery, Clanford L, MD   1 mg at 06/18/22 2326   magnesium chloride (SLOW-MAG) 64 MG SR tablet 64 mg  1 tablet Oral Daily Emeterio Reeve, DO   64 mg  at 07/04/22 0959   melatonin tablet 3 mg  3 mg Oral QHS Beulah Gandy A, NP   3 mg at 07/03/22 2117   ondansetron (ZOFRAN) tablet 4 mg  4 mg Oral Q6H PRN Johnson, Clanford L, MD       Or   ondansetron (ZOFRAN) injection 4 mg  4 mg Intravenous Q6H PRN Johnson, Clanford L, MD   4 mg at 06/28/22 2314   oxyCODONE (Oxy IR/ROXICODONE) immediate release tablet 5 mg  5 mg Oral Q4H PRN Johnson, Clanford L, MD   5 mg at 07/04/22 0957   pantoprazole (PROTONIX) EC tablet 40 mg  40 mg Oral Daily Johnson, Clanford L, MD   40 mg at 07/04/22 0958   PHENobarbital (LUMINAL) tablet 32.4 mg  32.4 mg Oral Daily Pham, Minh Q, RPH-CPP   32.4 mg at 07/04/22 [phone]  polyvinyl alcohol (LIQUIFILM TEARS) 1.4 % ophthalmic solution 1 drop  1 drop Both Eyes Daily PRN Johnson, Clanford L, MD       pregabalin (LYRICA) capsule 200 mg  200 mg Oral BID Johnson, Clanford L, MD   200 mg at 07/04/22 0957   rosuvastatin (CRESTOR) tablet 20 mg  20 mg Oral Daily Mansy, Jan A, MD   20 mg at 07/04/22 0958   sodium chloride flush (NS) 0.9 % injection 10-40 mL  10-40 mL Intracatheter Q12H Derek Jack, MD   10 mL at 07/04/22 0959   sodium chloride flush (NS) 0.9 % injection 10-40 mL  10-40 mL Intracatheter PRN Derek Jack, MD   10 mL at 06/28/22 1332   sodium phosphate (FLEET) 7-19 GM/118ML enema 1 enema  1 enema Rectal Daily PRN Emeterio Reeve, DO       traZODone (DESYREL) tablet 50 mg  50 mg Oral QHS Lang Snow, NP   50 mg at 07/03/22 2117     Discharge Medications: Please see discharge summary for a list of discharge medications.  Relevant Imaging Results:  Relevant Lab Results:   Additional Information SS#: [id number]  Pollie Friar, RN

## 2022-07-04 NOTE — TOC Progression Note (Addendum)
Transition of Care [facility]) - Progression Note    Patient Details  Name: Joanna Reid MRN: [id number] Date of Birth: 12-23-1965  Transition of [facility]) CM/SW Contact  Pollie Friar, RN Phone Number: 07/04/2022, 10:47 AM  Clinical Narrative:    CIR doesn't feel pt is participating at a level to tolerate 3 hours of therapy a day. CM had discussed this with patients spouse last week and he was agreeable to SNF in [facility] if CIR was not an option. CM has faxed her out in [place]. Awaiting offers to present to spouse.  TOC following.  1509: CM spoke to patients spouse about the bed offers for SNF. He wants to review them and asked that she also be faxed to [facility] in [place], [place]. CM has sent them the referral: [phone].  Expected Discharge Plan: [place] Barriers to Discharge: Continued Medical Work up  Expected Discharge Plan and Services In-house Referral: Clinical Social Work Discharge Planning Services: CM Consult Post Acute Care Choice: [place] Living arrangements for the past 2 months: [place] Determinants of Health (SDOH) Interventions SDOH Screenings   Food Insecurity: No Food Insecurity (06/19/2022)  Housing: Low Risk  (06/19/2022)  Transportation Needs: No Transportation Needs (06/19/2022)  Utilities: Not At Risk (06/19/2022)  Alcohol Screen: Low Risk  (08/08/2021)  Depression (PHQ2-9): Low Risk  (04/19/2022)  Financial Resource Strain: Low Risk  (08/08/2021)  Physical Activity: Unknown (08/08/2021)  Social Connections: Socially Integrated (08/08/2021)  Stress: No Stress Concern Present (08/08/2021)  Tobacco Use: Medium Risk (06/18/2022)    Readmission Risk Interventions     No data to display

## 2022-07-05 ENCOUNTER — Ambulatory Visit: Payer: BC Managed Care – PPO

## 2022-07-05 DIAGNOSIS — R569 Unspecified convulsions: Secondary | ICD-10-CM | POA: Diagnosis not present

## 2022-07-05 DIAGNOSIS — R531 Weakness: Secondary | ICD-10-CM | POA: Diagnosis not present

## 2022-07-05 MED ORDER — QUETIAPINE FUMARATE 25 MG PO TABS
12.5000 mg | ORAL_TABLET | Freq: Every day | ORAL | Status: DC
  Administered 2022-07-05: 12.5 mg via ORAL
  Filled 2022-07-05: qty 1

## 2022-07-05 MED ORDER — LORAZEPAM 2 MG/ML IJ SOLN
0.5000 mg | Freq: Once | INTRAMUSCULAR | Status: AC
  Administered 2022-07-05: 0.5 mg via INTRAVENOUS
  Filled 2022-07-05: qty 1

## 2022-07-05 NOTE — TOC Progression Note (Signed)
Transition of Care [facility]) - Progression Note    Patient Details  Name: Joanna Reid MRN: [id number] Date of Birth: April 13, 1966  [facility]) CM/SW Contact  Pollie Friar, RN Phone Number: 07/05/2022, 2:57 PM  Clinical Narrative:    [name] has denied the patient for admission to rehab. CM has updated the pts spouse. He is going to discuss the offers with his family and update CM.   Expected Discharge Plan: [place] Barriers to Discharge: Continued Medical Work up  Expected Discharge Plan and Services In-house Referral: Clinical Social Work Discharge Planning Services: CM Consult Post Acute Care Choice: [place] Living arrangements for the past 2 months: [place] Determinants of Health (SDOH) Interventions SDOH Screenings   Food Insecurity: No Food Insecurity (06/19/2022)  Housing: Low Risk  (06/19/2022)  Transportation Needs: No Transportation Needs (06/19/2022)  Utilities: Not At Risk (06/19/2022)  Alcohol Screen: Low Risk  (08/08/2021)  Depression (PHQ2-9): Low Risk  (04/19/2022)  Financial Resource Strain: Low Risk  (08/08/2021)  Physical Activity: Unknown (08/08/2021)  Social Connections: Socially Integrated (08/08/2021)  Stress: No Stress Concern Present (08/08/2021)  Tobacco Use: Medium Risk (06/18/2022)    Readmission Risk Interventions     No data to display

## 2022-07-05 NOTE — Evaluation (Signed)
Occupational Therapy Evaluation Patient Details Name: Joanna Reid MRN: [id number] DOB: 1965/08/19 Today's Date: 07/05/2022   History of Present Illness 56 yo female presents to [facility] on 12/11 with headache and lethargy. + seizures in ED. MRI brain shows small incidental stroke in the right caudate. EEG shows Continuous L lateralized periodic discharges (high risk for seizures). Pt with multiple myeloma currently undergoing treatment last chemo 2 weeks ago.  Other PMH includes hypertension, gout, GERD, type 2 diabetes mellitus, migraine headaches, stroke with residual aphasia   Clinical Impression   Pt presents with generalized weakness, decreased level of arousal, poor sitting balance/trunk control, activity tolerance, communication, and cognition. Pt performing LB ADL with total A +2 and UB ADL with up to max A. Pt intermittently following ~50% of commands when in alert state, but requiring up to mod verbal cueing to maintain level of arousal. Of note, pt given ativan last night. Pt with multiple attempts to verbalize but difficulty expressing self. Pt sitting EOB with mod A - Min guard A ~20 minutes. Pt following commands with min A for initiation to perform grooming tasks. Due to pt support and significant change in functional status, highly recommending AIR to optimize safety and independence in ALD and IADL.      Recommendations for follow up therapy are one component of a multi-disciplinary discharge planning process, led by the attending physician.  Recommendations may be updated based on patient status, additional functional criteria and insurance authorization.   Follow Up Recommendations  Acute inpatient rehab (3hours/day)     Assistance Recommended at Discharge Frequent or constant Supervision/Assistance  Patient can return home with the following Two people to help with walking and/or transfers;Two people to help with bathing/dressing/bathroom;Assistance with feeding;Assistance  with cooking/housework;Direct supervision/assist for medications management;Direct supervision/assist for financial management;Assist for transportation;Help with stairs or ramp for entrance    Functional Status Assessment  Patient has had a recent decline in their functional status and demonstrates the ability to make significant improvements in function in a reasonable and predictable amount of time.  Equipment Recommendations  Other (comment) (defer to next venue of care)    Recommendations for Other Services Rehab consult     Precautions / Restrictions Precautions Precautions: Fall Restrictions Weight Bearing Restrictions: No      Mobility Bed Mobility Overal bed mobility: Needs Assistance Bed Mobility: Supine to Sit, Sit to Supine, Rolling Rolling: Mod assist, +2 for physical assistance   Supine to sit: Max assist Sit to supine: Max assist   General bed mobility comments: assist for trunk and LE management, scooting to/from EOB, boost up, and repositioning once in supine; pt is able to assist with boosting up with verbal/tactile cues for hand placement    Transfers                   General transfer comment: deferred secondary to level of arousal and poor trunk/postura; control      Balance Overall balance assessment: Needs assistance Sitting-balance support: Feet supported, Single extremity supported Sitting balance-Leahy Scale: Fair Sitting balance - Comments: Initially pt requires Mod to Min trunk assist but was able to improve to close SBA/CGA with verbal cues when pt would start to lean posteriorly Postural control: Posterior lean                                 ADL either performed or assessed with clinical judgement   ADL Overall ADL's :  Needs assistance/impaired Eating/Feeding: Moderate assistance;Bed level   Grooming: Wash/dry face;Oral care;Moderate assistance;Sitting;Bed level Grooming Details (indicate cue type and reason):  washing face at bed level with min A for initiation; only observed to wash mouth; mod A to wash forehead/eye area. Min A for balance support EOB and to initiate oral care; mod A for thoroughness. [place] [employer] Upper Body Bathing: Maximal assistance;Sitting   Lower Body Bathing: Total assistance;+2 for physical assistance;+2 for safety/equipment   Upper Body Dressing : Cueing for sequencing;Bed level;Maximal assistance   Lower Body Dressing: Total assistance;+2 for physical assistance;+2 for safety/equipment     Toilet Transfer Details (indicate cue type and reason): deferred Toileting- Clothing Manipulation and Hygiene: Total assistance       Functional mobility during ADLs:  (deferred past EOB)       Vision Patient Visual Report: Other (comment) (Pt unable to report) Vision Assessment?: Vision impaired- to be further tested in functional context Additional Comments: Pt making eye contact intermittently throughout session. Light RL beating nystagmus observed 1x during session     Perception     Praxis      Pertinent Vitals/Pain Pain Assessment Pain Assessment: Faces Faces Pain Scale: Hurts even more Pain Location: stomach, head Pain Descriptors / Indicators: Guarding, Grimacing, Moaning Pain Intervention(s): Limited activity within patient's tolerance, Monitored during session     Hand Dominance Right   Extremity/Trunk Assessment Upper Extremity Assessment Upper Extremity Assessment: Generalized weakness;RUE deficits/detail;LUE deficits/detail RUE Deficits / Details: Difficulty following commands to participate in MMT. PT with generalized weakness. LUE Deficits / Details: Difficulty following commands to participate in MMT. PT with generalized weakness.   Lower Extremity Assessment Lower Extremity Assessment: Defer to PT evaluation   Cervical / Trunk Assessment Cervical / Trunk Assessment: Kyphotic   Communication Communication Communication: Expressive difficulties;Receptive  difficulties   Cognition Arousal/Alertness: Lethargic, Awake/alert Behavior During Therapy: Flat affect Overall Cognitive Status: Impaired/Different from baseline Area of Impairment: Attention, Following commands, Safety/judgement, Problem solving, Awareness                   Current Attention Level: Focused   Following Commands: Follows one step commands inconsistently Safety/Judgement: Decreased awareness of safety, Decreased awareness of deficits Awareness: Intellectual Problem Solving: Slow processing, Decreased initiation, Difficulty sequencing, Requires verbal cues, Requires tactile cues General Comments: Pt with fluctuating level of arousal throughout requring max verbal stim to mainatain awake state. Following 50% commands with min A for tactile cues to initiate during alert state. Pt with expressive difficulties and able to better express when attempting to repeat therapist. When asked last name, pt reporting name from first marriage. Pt was able to verbally express pain at end of session     General Comments  husband present and supportive    Exercises Exercises: Other exercises Other Exercises Other Exercises: anterior/posterior weight shifting to encourage weight bearing through BLEs with pt pushing this PTA away Other Exercises: PROM BUE [facility]   Shoulder Instructions      Home Living Family/patient expects to be discharged to:: Private residence Living Arrangements: Spouse/significant other Available Help at Discharge: Family;Other (Comment) (qorking on 24/7) Type of Home: Mobile home Home Access: Stairs to enter [employer] of Steps: 3 Entrance Stairs-Rails: Can reach both Home Layout: One level     Bathroom Shower/Tub: Teacher, early years/pre: Standard     Home Equipment: Cane - quad          Prior Functioning/Environment Prior Level of Function : Needs assist  Mobility Comments: pt using cane PRN since  starting cancer treatment ADLs Comments: Per pt's daughter, pt is typically independent        OT Problem List: Decreased strength;Decreased activity tolerance;Impaired balance (sitting and/or standing);Impaired vision/perception;Decreased coordination;Decreased cognition;Decreased safety awareness;Decreased knowledge of use of DME or AE;Impaired UE functional use;Pain      OT Treatment/Interventions: Self-care/ADL training;Therapeutic exercise;DME and/or AE instruction;Therapeutic activities;Cognitive remediation/compensation;Visual/perceptual remediation/compensation;Patient/family education;Balance training    OT Goals(Current goals can be found in the care plan section) Acute Rehab OT Goals Patient Stated Goal: unable to state OT Goal Formulation: Patient unable to participate in goal setting Time For Goal Achievement: 08/04/2022 Potential to Achieve Goals: Good  OT Frequency: Min 2X/week    Co-evaluation              AM-PAC OT "6 Clicks" Daily Activity     Outcome Measure Help from another person eating meals?: A Lot Help from another person taking care of personal grooming?: A Lot Help from another person toileting, which includes using toliet, bedpan, or urinal?: Total Help from another person bathing (including washing, rinsing, drying)?: A Lot Help from another person to put on and taking off regular upper body clothing?: A Lot Help from another person to put on and taking off regular lower body clothing?: Total 6 Click Score: 10   End of Session Nurse Communication: Mobility status;Other (comment) (headache)  Activity Tolerance: Patient limited by lethargy Patient left: in bed;with call bell/phone within reach;with bed alarm set;with family/visitor present  OT Visit Diagnosis: Unsteadiness on feet (R26.81);Muscle weakness (generalized) (M62.81);Other symptoms and signs involving cognitive function;Cognitive communication deficit (R41.841);Pain Pain - part of body:   (headache)                Time: 8003-4917 OT Time Calculation (min): 33 min Charges:  OT General Charges $OT Visit: 1 Visit OT Evaluation $OT Eval Moderate Complexity: 1 Mod  Elder Cyphers, OTR/L [facility] Acute Rehabilitation Office: [phone]   Magnus Ivan 07/05/2022, 9:49 AM

## 2022-07-05 NOTE — Plan of Care (Signed)
  Problem: Education: Goal: Knowledge of General Education information will improve Description: Including pain rating scale, medication(s)/side effects and non-pharmacologic comfort measures Outcome: Progressing   Problem: Health Behavior/Discharge Planning: Goal: Ability to manage health-related needs will improve Outcome: Progressing   Problem: Clinical Measurements: Goal: Ability to maintain clinical measurements within normal limits will improve Outcome: Progressing Goal: Will remain free from infection Outcome: Progressing Goal: Diagnostic test results will improve Outcome: Progressing Goal: Respiratory complications will improve Outcome: Progressing Goal: Cardiovascular complication will be avoided Outcome: Progressing   Problem: Activity: Goal: Risk for activity intolerance will decrease Outcome: Progressing   Problem: Nutrition: Goal: Adequate nutrition will be maintained Outcome: Progressing   Problem: Coping: Goal: Level of anxiety will decrease Outcome: Progressing   Problem: Elimination: Goal: Will not experience complications related to bowel motility Outcome: Progressing Goal: Will not experience complications related to urinary retention Outcome: Progressing   Problem: Pain Managment: Goal: General experience of comfort will improve Outcome: Progressing   Problem: Safety: Goal: Ability to remain free from injury will improve Outcome: Progressing   Problem: Skin Integrity: Goal: Risk for impaired skin integrity will decrease Outcome: Progressing   Problem: Education: Goal: Knowledge of disease or condition will improve Outcome: Progressing Goal: Knowledge of secondary prevention will improve (MUST DOCUMENT ALL) Outcome: Progressing Goal: Knowledge of patient specific risk factors will improve [place]ta [place] N/A or DELETE if not current risk factor) Outcome: Progressing   Problem: Ischemic Stroke/TIA Tissue Perfusion: Goal: Complications of ischemic  stroke/TIA will be minimized Outcome: Progressing   Problem: Coping: Goal: Will verbalize positive feelings about self Outcome: Progressing Goal: Will identify appropriate support needs Outcome: Progressing   Problem: Self-Care: Goal: Ability to participate in self-care as condition permits will improve Outcome: Progressing Goal: Verbalization of feelings and concerns over difficulty with self-care will improve Outcome: Progressing Goal: Ability to communicate needs accurately will improve Outcome: Progressing   Problem: Nutrition: Goal: Risk of aspiration will decrease Outcome: Progressing   Problem: Education: Goal: Expressions of having a comfortable level of knowledge regarding the disease process will increase Outcome: Progressing   Problem: Coping: Goal: Ability to adjust to condition or change in health will improve Outcome: Progressing Goal: Ability to identify appropriate support needs will improve Outcome: Progressing   Problem: Health Behavior/Discharge Planning: Goal: Compliance with prescribed medication regimen will improve Outcome: Progressing   Problem: Medication: Goal: Risk for medication side effects will decrease Outcome: Progressing   Problem: Clinical Measurements: Goal: Complications related to the disease process, condition or treatment will be avoided or minimized Outcome: Progressing Goal: Diagnostic test results will improve Outcome: Progressing   Problem: Safety: Goal: Verbalization of understanding the information provided will improve Outcome: Progressing   Problem: Self-Concept: Goal: Level of anxiety will decrease Outcome: Progressing Goal: Ability to verbalize feelings about condition will improve Outcome: Progressing

## 2022-07-05 NOTE — Progress Notes (Signed)
PROGRESS NOTE    Joanna Reid   WCH:[id number] DOB: 11-08-1965  DOA: 06/18/2022 Date of Service: 07/05/22 PCP: Lindell Spar, MD  Brief Narrative / Interim history: 56 year old female with refractory multiple myeloma status post relapse, hypertension, gout, GERD, type 2 diabetes mellitus, migraine headaches, stroke with residual aphasia presented to ED for ongoing weakness and lethargy. Last had chemotherapy 2 weeks ago. She has recently received PRBC and platelets in the cancer center. While in the ED patient had acute mental status change, unarousable with full tonic-clonic seizure lasting 2 to 3 minutes which improved with lorazepam. Transferred to [place] for further neuro evaluation and follow-up as well as continuous EEG.  She has been placed on multiple antiepileptics, now some AEDs have been discontinued or are being tapered down as below under guidance from neurology. Transitioned to oral phenobarbital, Vimpat, Keppra as of 12/23. Bit more alert 12/24 through 12/26 but not oriented except to person, place.    Consultants:  Neurology  Oncology   Procedures: None   Assesement and Plan:  Principal Problem:   Generalized weakness Active Problems:   Multiple myeloma (HCC)   Hyperlipidemia   Acute on chronic anemia   Leukopenia   Stroke (cerebrum) -Left parietal lobe, 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    Chronic pain disorder   LVH (left ventricular hypertrophy)   Morbid obesity with BMI of 45.0-49.9, adult (HCC)   Drug-induced polyneuropathy (HCC)   History of CVA with residual deficit   Overactive bladder   Subclinical hypothyroidism   Hypokalemia   Grand mal seizure ([place])   Hypomagnesemia   Status epilepticus (HCC)   New Onset Partial Status Epilepticus in the setting of underlying CVA, acute metabolic encephalopathy  MRI on admission showed a 2 mm acute white matter infarct lateral to the right caudate head, and also tiny subacute  infarcts in the anterior right corona radiata and left occipital lobe white matter.   EEG was concerning for possible seizures.   Workup with a 2D echo showed normal EF 65 to 35%, grade 1 diastolic dysfunction, bubble study was negative, A1c 7.2.  converted convert IV antiepileptics to p.o. -->  Transitioned to oral phenobarbital, Vimpat, Keppra as of 12/23.  Continue aspirin and statin for stroke.     Will need rehab SNF Outpatient plan: F/u with neuro in 3 months. May consider repeat MRI brain at that time if still has language deficits   Pt remains fairly somnolent/confused, asked neurology team to reassess meds 07/03/22 - appreciate insight/recs  Abdominal pain - secondary to nephrolithiasis(nonobstructive) Incidental liver lesions noted  Patient indicates her previous flank pain is consistent with her history of nephrolithiasis, CTA notable for nonobstructing nephrolithasis -per husband these are somewhat chronic.   Abdominal imaging incidentally showed multiple liver lesions, which will require an MRI as an outpatient.   Hypomagnesemia, hypokalemia  continue to monitor PRN    Pancytopenia  Secondary to myeloma/treament platelets gradually improving and are overall stable.   Received a total of 3 units of packed red blood cells, last one 12/20.   Hemoglobin and platelets are stable this morning but slightly lower than yesterday Monitor CBC Transfuse as needed   Refractory Multiple Myeloma  Followed by Dr. Delton Coombes.  Last chemo given 2 weeks prior to admit  DVT prophylaxis: SCD Pertinent IV fluids/nutrition: dysphagia 2 diet per SLP eval 12/22 Central lines / invasive devices: R chest port   Code Status: FULL CODE   Disposition: inpatient  TOC needs: SNF placement Barriers to discharge / significant pending items: placement, mentation improvement, neurology clearance for discharge, may be able to go next few days   Subjective:  Patient alert today about same as  yesterday, still confused - difficulty sleeping overnight again Family at bedside provides her history, having loose stools now, previously complaining of constipation.   Family Communication: Daughter at bedside on rounds     Objective Findings:  Vitals:   07/04/22 2031 07/04/22 2320 07/05/22 0358 07/05/22 0400  BP: 131/82 120/68 137/87   Pulse: 93 97 100 (!) 102  Resp: _0 Temp: 97.8 F (36.6 C) (!) 97.4 F (36.3 C) 98.4 F (36.9 C)   TempSrc: Oral Oral    SpO2: 100% 100% 100%   Weight:      Height:        Intake/Output Summary (Last 24 hours) at 07/05/2022 0749 Last data filed at 07/05/2022 0400 Gross per 24 hour  Intake 10 ml  Output 400 ml  Net -390 ml    Filed Weights   06/18/22 1128 06/18/22 1728 06/19/22 0500  Weight: 107 kg 109.3 kg 109.4 kg    Examination:  Constitutional:  VS as above General Appearance: alert, well-developed, well-nourished, NAD Ears, Nose, Mouth, Throat: MMM Respiratory: Normal respiratory effort No wheeze No rhonchi No rales Cardiovascular: S1/S2 normal No murmur RRR No lower extremity edema Gastrointestinal: No tenderness (+)BS WNLx4  Musculoskeletal:  No clubbing/cyanosis of digits Neurological: No cranial nerve deficit on limited exam Again she is oriented to "in the hospital" but not answering other orientation questions         Scheduled Medications:   acyclovir  400 mg Oral BID   allopurinol  300 mg Oral Daily   aspirin  325 mg Oral Daily   Chlorhexidine Gluconate Cloth  6 each Topical Daily   escitalopram  10 mg Oral Daily   fluticasone  2 spray Each Nare Daily   influenza vac split quadrivalent PF  0.5 mL Intramuscular Tomorrow-1000   lacosamide  100 mg Oral BID   levETIRAcetam  1,000 mg Oral BID   magnesium chloride  1 tablet Oral Daily   melatonin  3 mg Oral QHS   pantoprazole  40 mg Oral Daily   phenobarbital  32.4 mg Oral Daily   pregabalin  200 mg Oral BID   rosuvastatin  20 mg Oral  Daily   sodium chloride flush  10-40 mL Intracatheter Q12H   traZODone  50 mg Oral QHS    Continuous Infusions:     PRN Medications:  acetaminophen **OR** acetaminophen, albuterol, bisacodyl, diclofenac Sodium, fentaNYL (SUBLIMAZE) injection, LORazepam, ondansetron **OR** ondansetron (ZOFRAN) IV, oxyCODONE, polyvinyl alcohol, sodium chloride flush, sodium phosphate  Antimicrobials:  Anti-infectives (From admission, onward)    Start     Dose/Rate Route Frequency Ordered Stop   06/19/22 1200  vancomycin (VANCOREADY) IVPB 1500 mg/300 mL  Status:  Discontinued        1,500 mg 150 mL/hr over 120 Minutes Intravenous Every 24 hours 06/18/22 1152 06/18/22 1514   06/18/22 2200  acyclovir (ZOVIRAX) tablet 400 mg       Note to Pharmacy: TAKE (1) TABLET BY MOUTH TWICE DAILY.     400 mg Oral 2 times daily 06/18/22 1514     06/18/22 2000  ceFEPIme (MAXIPIME) 2 g in sodium chloride 0.9 % 100 mL IVPB  Status:  Discontinued        2 g 200 mL/hr over 30 Minutes Intravenous  Every 8 hours 06/18/22 1152 06/18/22 1514   06/18/22 1200  vancomycin (VANCOREADY) IVPB 2000 mg/400 mL        2,000 mg 200 mL/hr over 120 Minutes Intravenous  Once 06/18/22 1149 06/18/22 1451   06/18/22 1145  ceFEPIme (MAXIPIME) 2 g in sodium chloride 0.9 % 100 mL IVPB        2 g 200 mL/hr over 30 Minutes Intravenous  Once 06/18/22 1144 06/18/22 1304   06/18/22 1145  metroNIDAZOLE (FLAGYL) IVPB 500 mg        500 mg 100 mL/hr over 60 Minutes Intravenous  Once 06/18/22 1144 06/18/22 1431   06/18/22 1145  vancomycin (VANCOCIN) IVPB 1000 mg/200 mL premix  Status:  Discontinued        1,000 mg 200 mL/hr over 60 Minutes Intravenous  Once 06/18/22 1144 06/18/22 1149           Data Reviewed: I have personally reviewed following labs and imaging studies  CBC: Recent Labs  Lab 06/29/22 0319 06/30/22 0243 07/03/22 0847  WBC 5.2 4.3 5.6  HGB 7.8* 7.6* 8.2*  HCT 22.6* 23.1* 25.1*  MCV 88.3 90.6 91.9  PLT 47* 50* 58*     Basic Metabolic Panel: Recent Labs  Lab 06/29/22 0319 06/30/22 0243 07/01/22 0320 07/02/22 0455 07/03/22 0847  NA 135 133* 133* 131* 130*  K 3.6 3.5 3.6 3.9 4.1  CL 104 100 101 99 99  CO2 _0 GLUCOSE 102* 119* 107* 109* 118*  BUN _1 CREATININE 0.53 0.45 0.57 0.53 0.57  CALCIUM 7.4* 7.6* 7.6* 7.7* 8.0*  MG 2.5* 1.4* 1.6* 1.5*  --     GFR: Estimated Creatinine Clearance: 93.8 mL/min (by C-G formula based on SCr of 0.57 mg/dL). Liver Function Tests: Recent Labs  Lab 06/30/22 0243  AST 25  ALT 13  ALKPHOS 197*  BILITOT 0.6  PROT 3.8*  ALBUMIN 1.7*    No results for input(s): "LIPASE", "AMYLASE" in the last 168 hours. No results for input(s): "AMMONIA" in the last 168 hours.  Coagulation Profile: No results for input(s): "INR", "PROTIME" in the last 168 hours. Cardiac Enzymes: No results for input(s): "CKTOTAL", "CKMB", "CKMBINDEX", "TROPONINI" in the last 168 hours. BNP (last 3 results) No results for input(s): "PROBNP" in the last 8760 hours. HbA1C: No results for input(s): "HGBA1C" in the last 72 hours. CBG: No results for input(s): "GLUCAP" in the last 168 hours.  Lipid Profile: No results for input(s): "CHOL", "HDL", "LDLCALC", "TRIG", "CHOLHDL", "LDLDIRECT" in the last 72 hours. Thyroid Function Tests: No results for input(s): "TSH", "T4TOTAL", "FREET4", "T3FREE", "THYROIDAB" in the last 72 hours. Anemia Panel: No results for input(s): "VITAMINB12", "FOLATE", "FERRITIN", "TIBC", "IRON", "RETICCTPCT" in the last 72 hours. Most Recent Urinalysis On File:     Component Value Date/Time   COLORURINE YELLOW 06/18/2022 1342   APPEARANCEUR CLOUDY (A) 06/18/2022 1342   APPEARANCEUR Cloudy (A) 05/02/2022 1113   LABSPEC 1.028 06/18/2022 1342   PHURINE 5.0 06/18/2022 1342   GLUCOSEU NEGATIVE 06/18/2022 1342   HGBUR NEGATIVE 06/18/2022 1342   BILIRUBINUR NEGATIVE 06/18/2022 1342   BILIRUBINUR Negative 05/02/2022 1113   KETONESUR 5 (A)  06/18/2022 1342   PROTEINUR 30 (A) 06/18/2022 1342   UROBILINOGEN 0.2 11/27/2021 1534   UROBILINOGEN 0.2 05/07/2012 0829   NITRITE NEGATIVE 06/18/2022 1342   LEUKOCYTESUR NEGATIVE 06/18/2022 1342   Sepsis Labs: _2 (procalcitonin:4,lacticidven:4)  No results found for this or any previous visit (from the past  240 hour(s)).       Radiology Studies: ECHOCARDIOGRAM COMPLETE BUBBLE STUDY  Result Date: 06/19/2022    ECHOCARDIOGRAM REPORT   Patient Name:   Joanna Reid Date of Exam: 06/19/2022 Medical Rec #:  [id number]        Height:       64.0 in Accession #:    [id number]       Weight:       241.2 lb Date of Birth:  15-Sep-1965        BSA:          2.118 m Patient Age:    19 years         BP:           161/92 mmHg Patient Gender: F                HR:           86 bpm. Exam Location:  [facility] Procedure: 2D Echo, Cardiac Doppler, Color Doppler and Saline Contrast Bubble            Study Indications:    Stroke  History:        Patient has prior history of Echocardiogram examinations, most                 recent 01/16/2022. Stroke; Risk Factors:Hypertension, Diabetes,                 Dyslipidemia and Former Smoker. Multiple myleoma, Port-a-cath in                 place.  Sonographer:    Wenda Low Referring Phys: [id number] JAN A MANSY  Sonographer Comments: Patient is obese. IMPRESSIONS  1. Left ventricular ejection fraction, by estimation, is 60 to 65%. The left ventricle has normal function. The left ventricle has no regional wall motion abnormalities. There is moderate asymmetric left ventricular hypertrophy of the septal segment. Left ventricular diastolic parameters are consistent with Grade I diastolic dysfunction (impaired relaxation).  2. Right ventricular systolic function is normal. The right ventricular size is normal. There is normal pulmonary artery systolic pressure. The estimated right ventricular systolic pressure is 41.2 mmHg.  3. The mitral valve is grossly normal.  Trivial mitral valve regurgitation.  4. The aortic valve is tricuspid. Aortic valve regurgitation is mild. Aortic regurgitation PHT measures 603 msec.  5. Aortic dilatation noted. There is mild dilatation of the ascending aorta, measuring 41 mm.  6. The inferior vena cava is normal in size with <50% respiratory variability, suggesting right atrial pressure of 8 mmHg.  7. Agitated saline contrast bubble study was negative, with no evidence of right to left interatrial shunt. Comparison(s): Prior images reviewed side by side. LVEF remains normal range at 60-65%. Mildly dilated ascending aorta with mild aortic regurgitation. FINDINGS  Left Ventricle: Left ventricular ejection fraction, by estimation, is 60 to 65%. The left ventricle has normal function. The left ventricle has no regional wall motion abnormalities. The left ventricular internal cavity size was normal in size. There is  moderate asymmetric left ventricular hypertrophy of the septal segment. Left ventricular diastolic parameters are consistent with Grade I diastolic dysfunction (impaired relaxation). Right Ventricle: The right ventricular size is normal. No increase in right ventricular wall thickness. Right ventricular systolic function is normal. There is normal pulmonary artery systolic pressure. The tricuspid regurgitant velocity is 2.20 m/s, and  with an assumed right atrial pressure of 8 mmHg, the estimated right ventricular systolic pressure is 27.4  mmHg. Left Atrium: Left atrial size was normal in size. Right Atrium: Right atrial size was normal in size. Pericardium: There is no evidence of pericardial effusion. Mitral Valve: The mitral valve is grossly normal. Trivial mitral valve regurgitation. MV peak gradient, 4.1 mmHg. The mean mitral valve gradient is 2.0 mmHg. Tricuspid Valve: The tricuspid valve is grossly normal. Tricuspid valve regurgitation is trivial. Aortic Valve: The aortic valve is tricuspid. Aortic valve regurgitation is mild.  Aortic regurgitation PHT measures 603 msec. Aortic valve mean gradient measures 5.0 mmHg. Aortic valve peak gradient measures 12.4 mmHg. Aortic valve area, by VTI measures 2.95 cm. Pulmonic Valve: The pulmonic valve was grossly normal. Pulmonic valve regurgitation is trivial. Aorta: The aortic root is normal in size and structure and aortic dilatation noted. There is mild dilatation of the ascending aorta, measuring 41 mm. Venous: The inferior vena cava is normal in size with less than 50% respiratory variability, suggesting right atrial pressure of 8 mmHg. IAS/Shunts: No atrial level shunt detected by color flow Doppler. Agitated saline contrast was given intravenously to evaluate for intracardiac shunting. Agitated saline contrast bubble study was negative, with no evidence of any interatrial shunt.  LEFT VENTRICLE PLAX 2D LVIDd:         5.20 cm   Diastology LVIDs:         3.30 cm   LV e' medial:    5.22 cm/s LV PW:         1.30 cm   LV E/e' medial:  15.2 LV IVS:        1.50 cm   LV e' lateral:   7.62 cm/s LVOT diam:     2.10 cm   LV E/e' lateral: 10.4 LV SV:         93 LV SV Index:   44 LVOT Area:     3.46 cm  RIGHT VENTRICLE RV Basal diam:  2.70 cm RV Mid diam:    2.20 cm RV S prime:     14.80 cm/s LEFT ATRIUM             Index        RIGHT ATRIUM           Index LA diam:        4.30 cm 2.03 cm/m   RA Area:     13.20 cm LA Vol (A2C):   62.0 ml 29.27 ml/m  RA Volume:   24.40 ml  11.52 ml/m LA Vol (A4C):   46.4 ml 21.90 ml/m LA Biplane Vol: 54.2 ml 25.59 ml/m  AORTIC VALVE                    PULMONIC VALVE AV Area (Vmax):    2.81 cm     PV Vmax:       1.29 m/s AV Area (Vmean):   3.00 cm     PV Peak grad:  6.7 mmHg AV Area (VTI):     2.95 cm AV Vmax:           176.00 cm/s AV Vmean:          95.700 cm/s AV VTI:            0.315 m AV Peak Grad:      12.4 mmHg AV Mean Grad:      5.0 mmHg LVOT Vmax:         143.00 cm/s LVOT Vmean:        83.000 cm/s LVOT VTI:  0.268 m LVOT/AV VTI ratio: 0.85 AI PHT:             603 msec  AORTA Ao Root diam: 3.70 cm Ao Asc diam:  4.10 cm MITRAL VALVE                TRICUSPID VALVE MV Area (PHT): 5.06 cm     TR Peak grad:   19.4 mmHg MV Area VTI:   3.14 cm     TR Vmax:        220.00 cm/s MV Peak grad:  4.1 mmHg MV Mean grad:  2.0 mmHg     SHUNTS MV Vmax:       1.01 m/s     Systemic VTI:  0.27 m MV Vmean:      69.0 cm/s    Systemic Diam: 2.10 cm MV Decel Time: 150 msec MV E velocity: 79.50 cm/s MV A velocity: 114.00 cm/s MV E/A ratio:  0.70 Rozann Lesches MD Electronically signed by Rozann Lesches MD Signature Date/Time: 06/19/2022/10:34:16 AM    Final    MR BRAIN W WO CONTRAST  Result Date: 06/18/2022 CLINICAL DATA:  Provided history: Neuro deficit, acute, stroke suspected. Mental status change, unknown cause. EXAM: MRI HEAD WITHOUT AND WITH CONTRAST MRA HEAD WITHOUT CONTRAST TECHNIQUE: Multiplanar, multi-echo pulse sequences of the brain and surrounding structures were acquired without and with intravenous contrast. Angiographic images of the Circle of Willis were acquired using MRA technique without intravenous contrast. CONTRAST:  49m GADAVIST GADOBUTROL 1 MMOL/ML IV SOLN COMPARISON:  Brain MRI 06/12/2022. CT angiogram head/neck 12/26/2019. FINDINGS: MRI HEAD FINDINGS Intermittently motion degraded examination, limiting evaluation. Most notably, the axial T2 FLAIR sequence is moderately motion degraded, the coronal T2 and T2 FLAIR sequences through the hippocampi are mild to moderately motion degraded and the sagittal T1 weighted postcontrast sequence is moderately motion degraded. Brain: No age advanced or lobar predominant parenchymal atrophy. Redemonstrated moderate-sized chronic cortical/subcortical infarct within the left parietal lobe (posterior left MCA territory and left MCA/PCA watershed territory). Chronic hemosiderin deposition within portions of this infarct territory. New from the prior brain MRI of 06/12/2022, there is cortical diffusion-weighted signal  abnormality along the margins of this chronic infarct (within the right parietal lobe, at the left temporoparietal junction and within the lateral left occipital lobe) (for instance as seen on series 9, images 17 and 23). Subtle diffusion weighted signal abnormality is also suspected within the posteromedial left thalamus (series 9, image 17). Findings are suspicious for seizure related changes, although acute infarcts are also a consideration. Redemonstrated tiny foci of diffusion-weighted signal abnormality and T2 FLAIR hyperintense signal abnormality measuring up to 3 mm within the anterior right corona radiata (series 11, image 17) and left occipital lobe white matter (series 9, image 16). These were present on the prior brain MRI of 06/12/2022, and likely reflect small subacute infarcts (right MCA territory and left PCA territory, respectively). New 2 mm acute white matter infarct immediately lateral to the right caudate nucleus (series 9, image 19). Background multifocal T2 FLAIR hyperintense signal abnormality within the cerebral white matter (mild) and within the pons (moderate). Unchanged small chronic infarcts within the bilateral cerebellar hemispheres. No evidence of an intracranial mass. No extra-axial fluid collection. No midline shift. No definite pathologic intracranial enhancement. Vascular: Maintained flow voids within the proximal large arterial vessels. Skull and upper cervical spine: Heterogeneous bone marrow signal with numerous foci of osseous enhancement. These findings are compatible with widespread multiple myeloma. Incompletely assessed cervical spondylosis. Sinuses/Orbits: No  mass or acute finding within the imaged orbits. No significant paranasal sinus disease. Other: Trace fluid within the bilateral mastoid air cells. MRA HEAD FINDINGS Mildly motion degraded exam. Anterior circulation: The intracranial internal carotid arteries are patent. Mild atherosclerotic irregularity of both  vessels. The M1 middle cerebral arteries are patent. Atherosclerotic irregularity of the M2 and more distal MCA vessels, bilaterally. No M2 proximal branch occlusion or high-grade proximal stenosis. The anterior cerebral arteries are patent. Atherosclerotic irregularity of both vessels. Most notably, there is an apparent moderate/severe stenosis within the proximal right ACA A2 segment, which is new from the prior CTA of 12/26/2019. Posterior circulation: The intracranial vertebral arteries are patent. The basilar artery is patent. Mild atherosclerotic irregularity of the basilar artery. The posterior cerebral arteries are patent. Atherosclerotic irregularity of both vessels. Most notably, there are sites of up to moderate stenosis within left PCA branches at P2/P3 junction, progressed (series 1033, image 2) and a progressive moderate stenosis within a right PCA branch at the P2/P3 junction. A left posterior communicating artery is present. The right posterior communicating artery is diminutive or absent Anatomic variants: As described. MRI brain impression #2 and #3 will be called to the ordering clinician or representative by the Radiologist Assistant, and communication documented in the PACS or [employer]. IMPRESSION: MRI brain: 1. Intermittently motion degraded exam. 2. Foci of cortical diffusion-weighted signal abnormality along the margins of a chronic left parietal lobe infarct (within the left parietal lobe, at the left temporoparietal junction and within the lateral left occipital lobe). Subtle diffusion-weighted signal abnormality is also suspected within the posteromedial left thalamus. These findings are suspicious for seizure-related changes. However, alternatively, these may reflect acute infarcts. 3. New 2 mm acute white matter infarct immediately lateral to the right caudate head. 4. Redemonstrated tiny subacute infarcts within the anterior right corona radiata and left occipital lobe white  matter (right MCA and left PCA vascular territories, respectively). 5. Background chronic small vessel ischemic changes, as described and unchanged from the recent prior brain MRI of 06/12/2022. 6. Redemonstrated small chronic infarcts within both cerebellar hemispheres. 7. Widespread multiple myeloma. MRA head: 1. No intracranial large vessel occlusion is identified. 2. Intracranial atherosclerotic disease with multifocal stenoses, most notably as follows. 3. Moderate/severe stenosis within the right ACA proximal A2 segment, new from the prior CTA head/neck of 12/26/2019. 4. Sites of up to moderate stenosis within left PCA branches at the P2/P3 junction, progressed. 5. Moderate stenosis within a right PCA branch at the P2/P3 junction, progressed. Electronically Signed   By: Kellie Simmering D.O.   On: 06/18/2022 19:39   MR ANGIO HEAD WO CONTRAST  Result Date: 06/18/2022 CLINICAL DATA:  Provided history: Neuro deficit, acute, stroke suspected. Mental status change, unknown cause. EXAM: MRI HEAD WITHOUT AND WITH CONTRAST MRA HEAD WITHOUT CONTRAST TECHNIQUE: Multiplanar, multi-echo pulse sequences of the brain and surrounding structures were acquired without and with intravenous contrast. Angiographic images of the Circle of Willis were acquired using MRA technique without intravenous contrast. CONTRAST:  53m GADAVIST GADOBUTROL 1 MMOL/ML IV SOLN COMPARISON:  Brain MRI 06/12/2022. CT angiogram head/neck 12/26/2019. FINDINGS: MRI HEAD FINDINGS Intermittently motion degraded examination, limiting evaluation. Most notably, the axial T2 FLAIR sequence is moderately motion degraded, the coronal T2 and T2 FLAIR sequences through the hippocampi are mild to moderately motion degraded and the sagittal T1 weighted postcontrast sequence is moderately motion degraded. Brain: No age advanced or lobar predominant parenchymal atrophy. Redemonstrated moderate-sized chronic cortical/subcortical infarct within the left parietal lobe  (  posterior left MCA territory and left MCA/PCA watershed territory). Chronic hemosiderin deposition within portions of this infarct territory. New from the prior brain MRI of 06/12/2022, there is cortical diffusion-weighted signal abnormality along the margins of this chronic infarct (within the right parietal lobe, at the left temporoparietal junction and within the lateral left occipital lobe) (for instance as seen on series 9, images 17 and 23). Subtle diffusion weighted signal abnormality is also suspected within the posteromedial left thalamus (series 9, image 17). Findings are suspicious for seizure related changes, although acute infarcts are also a consideration. Redemonstrated tiny foci of diffusion-weighted signal abnormality and T2 FLAIR hyperintense signal abnormality measuring up to 3 mm within the anterior right corona radiata (series 11, image 17) and left occipital lobe white matter (series 9, image 16). These were present on the prior brain MRI of 06/12/2022, and likely reflect small subacute infarcts (right MCA territory and left PCA territory, respectively). New 2 mm acute white matter infarct immediately lateral to the right caudate nucleus (series 9, image 19). Background multifocal T2 FLAIR hyperintense signal abnormality within the cerebral white matter (mild) and within the pons (moderate). Unchanged small chronic infarcts within the bilateral cerebellar hemispheres. No evidence of an intracranial mass. No extra-axial fluid collection. No midline shift. No definite pathologic intracranial enhancement. Vascular: Maintained flow voids within the proximal large arterial vessels. Skull and upper cervical spine: Heterogeneous bone marrow signal with numerous foci of osseous enhancement. These findings are compatible with widespread multiple myeloma. Incompletely assessed cervical spondylosis. Sinuses/Orbits: No mass or acute finding within the imaged orbits. No significant paranasal sinus disease.  Other: Trace fluid within the bilateral mastoid air cells. MRA HEAD FINDINGS Mildly motion degraded exam. Anterior circulation: The intracranial internal carotid arteries are patent. Mild atherosclerotic irregularity of both vessels. The M1 middle cerebral arteries are patent. Atherosclerotic irregularity of the M2 and more distal MCA vessels, bilaterally. No M2 proximal branch occlusion or high-grade proximal stenosis. The anterior cerebral arteries are patent. Atherosclerotic irregularity of both vessels. Most notably, there is an apparent moderate/severe stenosis within the proximal right ACA A2 segment, which is new from the prior CTA of 12/26/2019. Posterior circulation: The intracranial vertebral arteries are patent. The basilar artery is patent. Mild atherosclerotic irregularity of the basilar artery. The posterior cerebral arteries are patent. Atherosclerotic irregularity of both vessels. Most notably, there are sites of up to moderate stenosis within left PCA branches at P2/P3 junction, progressed (series 1033, image 2) and a progressive moderate stenosis within a right PCA branch at the P2/P3 junction. A left posterior communicating artery is present. The right posterior communicating artery is diminutive or absent Anatomic variants: As described. MRI brain impression #2 and #3 will be called to the ordering clinician or representative by the Radiologist Assistant, and communication documented in the PACS or [employer]. IMPRESSION: MRI brain: 1. Intermittently motion degraded exam. 2. Foci of cortical diffusion-weighted signal abnormality along the margins of a chronic left parietal lobe infarct (within the left parietal lobe, at the left temporoparietal junction and within the lateral left occipital lobe). Subtle diffusion-weighted signal abnormality is also suspected within the posteromedial left thalamus. These findings are suspicious for seizure-related changes. However, alternatively, these may  reflect acute infarcts. 3. New 2 mm acute white matter infarct immediately lateral to the right caudate head. 4. Redemonstrated tiny subacute infarcts within the anterior right corona radiata and left occipital lobe white matter (right MCA and left PCA vascular territories, respectively). 5. Background chronic small vessel ischemic changes,  as described and unchanged from the recent prior brain MRI of 06/12/2022. 6. Redemonstrated small chronic infarcts within both cerebellar hemispheres. 7. Widespread multiple myeloma. MRA head: 1. No intracranial large vessel occlusion is identified. 2. Intracranial atherosclerotic disease with multifocal stenoses, most notably as follows. 3. Moderate/severe stenosis within the right ACA proximal A2 segment, new from the prior CTA head/neck of 12/26/2019. 4. Sites of up to moderate stenosis within left PCA branches at the P2/P3 junction, progressed. 5. Moderate stenosis within a right PCA branch at the P2/P3 junction, progressed. Electronically Signed   By: Kellie Simmering D.O.   On: 06/18/2022 19:39   DG Chest Port 1 View  Result Date: 06/18/2022 CLINICAL DATA:  Possible sepsis EXAM: PORTABLE CHEST 1 VIEW COMPARISON:  06/08/22 CXR FINDINGS: Right chest part with the tip at the cavoatrial junction. Unchanged cardiac and mediastinal contours. No pleural effusion. No pneumothorax. Bibasilar atelectasis. Visualized upper abdomen is unremarkable.No displaced rib fractures. IMPRESSION: Bibasilar atelectasis. Electronically Signed   By: Marin Roberts M.D.   On: 06/18/2022 12:12    LOS: 17 days   Little Ishikawa, DO [facility] 07/05/2022, 7:49 AM   If 7PM-7AM, please contact night coverage [email or web address]

## 2022-07-05 NOTE — Progress Notes (Signed)
Physical Therapy Treatment Patient Details Name: Joanna Reid MRN: [id number] DOB: 26-Jul-1965 Today's Date: 07/05/2022   History of Present Illness 56 yo female presents to [facility] on 12/11 with headache and lethargy. + seizures in ED. MRI brain shows small incidental stroke in the right caudate. EEG shows Continuous L lateralized periodic discharges (high risk for seizures). Pt with multiple myeloma currently undergoing treatment last chemo 2 weeks ago.  Other PMH includes hypertension, gout, GERD, type 2 diabetes mellitus, migraine headaches, stroke with residual aphasia    PT Comments    Pt received supine in bed for PT/OT session, with incremental progress towards goals, however limited secondary to decreased alertness, impaired balance/postural awareness, global fatigue and weakness. Pt requiring max assist +2 for all bed mobility. Pt able to maintain sitting EOB ~20 mins with intermittent min-mod assist to maintain balance as pt alertness waxing/waning and pt fatigue increasing. Therex focused on trunk control and increased Wbing through BLEs with pt tolerating fair. Pt returned to supine and bed placed in chair position. Pt continues to benefit from skilled PT services to progress toward functional mobility goals.    Recommendations for follow up therapy are one component of a multi-disciplinary discharge planning process, led by the attending physician.  Recommendations may be updated based on patient status, additional functional criteria and insurance authorization.  Follow Up Recommendations  Acute inpatient rehab (3hours/day)     Assistance Recommended at Discharge Frequent or constant Supervision/Assistance  Patient can return home with the following Two people to help with walking and/or transfers;A lot of help with bathing/dressing/bathroom;Assistance with cooking/housework;Direct supervision/assist for medications management;Help with stairs or ramp for entrance   Equipment  Recommendations  Other (comment)    Recommendations for Other Services       Precautions / Restrictions Precautions Precautions: Fall Restrictions Weight Bearing Restrictions: No     Mobility  Bed Mobility Overal bed mobility: Needs Assistance Bed Mobility: Supine to Sit, Sit to Supine, Rolling Rolling: Mod assist, +2 for physical assistance   Supine to sit: Max assist Sit to supine: Max assist   General bed mobility comments: assist for trunk and LE management, scooting to/from EOB, boost up, and repositioning once in supine; pt is able to assist with boosting up with verbal/tactile cues for hand placement    Transfers                   General transfer comment: deferred secondary to level of arousal and poor trunk/postural control    Ambulation/Gait                   Stairs             Wheelchair Mobility    Modified Rankin (Stroke Patients Only) Modified Rankin (Stroke Patients Only) Pre-Morbid Rankin Score: No significant disability Modified Rankin: Severe disability     Balance Overall balance assessment: Needs assistance Sitting-balance support: Feet supported, Single extremity supported Sitting balance-Leahy Scale: Fair Sitting balance - Comments: Initially pt requires Mod to Min trunk assist but was able to improve to close SBA/CGA with verbal cues when pt would start to lean posteriorly Postural control: Posterior lean                                  Cognition Arousal/Alertness: Lethargic, Awake/alert Behavior During Therapy: Flat affect Overall Cognitive Status: Impaired/Different from baseline Area of Impairment: Attention, Following commands, Safety/judgement, Problem solving, Awareness  Current Attention Level: Focused   Following Commands: Follows one step commands inconsistently Safety/Judgement: Decreased awareness of safety, Decreased awareness of deficits Awareness:  Intellectual Problem Solving: Slow processing, Decreased initiation, Difficulty sequencing, Requires verbal cues, Requires tactile cues General Comments: Pt with fluctuating level of arousal throughout requring max verbal stim to mainatain awake state. Following 50% commands with min A for tactile cues to initiate during alert state. Pt with expressive difficulties and able to better express when attempting to repeat therapist. When asked last name, pt reporting name from first marriage. Pt was able to verbally express pain at end of session        Exercises General Exercises - Lower Extremity Long Arc Quad: AROM, Right, Left, 10 reps, Seated Other Exercises Other Exercises: anterior/posterior weight shifting to encourage weight bearing through BLEs with pt pushing this PTA away Other Exercises: PROM BUE [facility]    General Comments General comments (skin integrity, edema, etc.): husband present and supportive, discussed hoyer lift to chair with husband as husband expressing concern that pt has not been up to chair, educated spouse on safety and need for pt to be more awake/alert and maintain arousal for safety for sitting up in chair      Pertinent Vitals/Pain Pain Assessment Pain Assessment: Faces Faces Pain Scale: Hurts even more Pain Location: stomach, head Pain Descriptors / Indicators: Guarding, Grimacing, Moaning Pain Intervention(s): Limited activity within patient's tolerance, Monitored during session, Repositioned    Home Living Family/patient expects to be discharged to:: Private residence Living Arrangements: Spouse/significant other Available Help at Discharge: Family;Other (Comment) (qorking on 24/7) Type of Home: Mobile home Home Access: Stairs to enter Entrance Stairs-Rails: Can reach both Entrance Stairs-Number of Steps: 3   Home Layout: One level Home Equipment: Cane - quad      Prior Function            PT Goals (current goals can now be found in the care plan  section) Acute Rehab PT Goals PT Goal Formulation: With patient Time For Goal Achievement: 07/06/22    Frequency    Min 4X/week      PT Plan Current plan remains appropriate    Co-evaluation PT/OT/SLP Co-Evaluation/Treatment: Yes Reason for Co-Treatment: Complexity of the patient's impairments (multi-system involvement);For patient/therapist safety PT goals addressed during session: Mobility/safety with mobility;Balance;Strengthening/ROM        AM-PAC PT "6 Clicks" Mobility   Outcome Measure  Help needed turning from your back to your side while in a flat bed without using bedrails?: A Lot Help needed moving from lying on your back to sitting on the side of a flat bed without using bedrails?: A Lot Help needed moving to and from a bed to a chair (including a wheelchair)?: Total Help needed standing up from a chair using your arms (e.g., wheelchair or bedside chair)?: Total Help needed to walk in hospital room?: Total Help needed climbing 3-5 steps with a railing? : Total 6 Click Score: 8    End of Session   Activity Tolerance: Patient limited by fatigue Patient left: in bed;with call bell/phone within reach;with bed alarm set;with family/visitor present (with bed in chair position) Nurse Communication: Mobility status;Patient requests pain meds PT Visit Diagnosis: Other abnormalities of gait and mobility (R26.89);Muscle weakness (generalized) (M62.81)     Time: 2778-2423 PT Time Calculation (min) (ACUTE ONLY): 34 min  Charges:  $Therapeutic Activity: 8-22 mins  Audry Riles. PTA Acute Rehabilitation Services Office: [place] 07/05/2022, 10:15 AM

## 2022-07-05 NOTE — Progress Notes (Addendum)
Subjective: Per family, patient has been having trouble sleeping.  She is at times agitated crying.  Denies any clinical seizures  ROS: Unable to obtain due to poor mental status  Examination  Vital signs in last 24 hours: Temp:  [97.4 F (36.3 C)-98.4 F (36.9 C)] 97.4 F (36.3 C) (12/28 1143) Pulse Rate:  [86-109] 96 (12/28 1143) Resp:  [16-20] 20 (12/28 1143) BP: (91-139)/(45-94) 91/56 (12/28 1143) SpO2:  [90 %-100 %] 99 % (12/28 1143)  General: lying in bed, NAD Neuro: Patient had just fallen asleep and per family patient has been having trouble sleeping therefore did not wake patient up.  However per family when she is awake she is able to have a conversation but does occasionally mumble and perseverates on words.  Basic Metabolic Panel: Recent Labs  Lab 06/29/22 0319 06/30/22 0243 07/01/22 0320 07/02/22 0455 07/03/22 0847  NA 135 133* 133* 131* 130*  K 3.6 3.5 3.6 3.9 4.1  CL 104 100 101 99 99  CO2 24 24 25 24 26  GLUCOSE 102* 119* 107* 109* 118*  BUN 11 11 9 9 10  CREATININE 0.53 0.45 0.57 0.53 0.57  CALCIUM 7.4* 7.6* 7.6* 7.7* 8.0*  MG 2.5* 1.4* 1.6* 1.5*  --     CBC: Recent Labs  Lab 06/29/22 0319 06/30/22 0243 07/03/22 0847  WBC 5.2 4.3 5.6  HGB 7.8* 7.6* 8.2*  HCT 22.6* 23.1* 25.1*  MCV 88.3 90.6 91.9  PLT 47* 50* 58*   Coagulation Studies: No results for input(s): "LABPROT", "INR" in the last 72 hours.  Imaging No new brain imaging overnight   ASSESSMENT AND PLAN: 56-year-old female with history of multiple medical myeloma,thrombocytopenia, left hemispheric stroke with resultant dysphagia and right-sided weakness which is gradually improving who presented with seizures.   Epilepsy Chronic stroke Multiple myeloma Acute encephalopathy, improving Pancytopenia -Epilepsy likely due to underlying stroke -Encephalopathy could be due to postictal state, medications.   Recommendations -Continue Keppra 1000mg twice daily,  Vimpat 100 mg twice  daily, pregabalin 200 mg twice daily and phenobarb taper -Patient was started on Seroquel 12.5 mg at 1700 and last to help with sundowning which had worked really well.  Therefore I will start that. -Keppra can also cause agitation and crying spells.  Therefore excitedly does not want, I will then switch Keppra to Onfi -If still confused, will likely repeat overnight EEG - Rescue meds: intranasal valtoco 10mg in each nostril for clinical seizure lasting over 5 minutes. If not approved by insurance, can prescribe Clonazepam ODT 2mg for  clinical seizure lasting over 5 minutes -Continue seizure precautions -Continue PT/OT, speech therapy -Management of rest of comorbidities per primary team -Discussed plan with mother-in-law at bedside and Dr. Lancaster - F/u with neuro in 3 months. May consider repeat MRI brain at that time if still has language deficits   I have spent a total of 36  minutes with the patient reviewing hospital notes,  test results, labs and examining the patient as well as establishing an assessment and plan.  > 50% of time was spent in direct patient care.     Epilepsy [facility] For questions after 5pm please refer to AMION to reach the Neurologist on call  

## 2022-07-05 NOTE — TOC Progression Note (Signed)
Transition of Care [facility]) - Progression Note    Patient Details  Name: NOU CHARD MRN: [id number] Date of Birth: 10-04-1965  [facility] CM/SW Contact  Pollie Friar, RN Phone Number: 07/05/2022, 1:55 PM  Clinical Narrative:    Cm called [facility] this am and they are still reviewing. CM called this pm and still have not reviewed but Janett Billow in admission was going to push to have reviewed.  TOC following.   Expected Discharge Plan: [place] Barriers to Discharge: Continued Medical Work up  Expected Discharge Plan and Services In-house Referral: Clinical Social Work Discharge Planning Services: CM Consult Post Acute Care Choice: [place] Living arrangements for the past 2 months: [place] Determinants of Health (SDOH) Interventions SDOH Screenings   Food Insecurity: No Food Insecurity (06/19/2022)  Housing: Low Risk  (06/19/2022)  Transportation Needs: No Transportation Needs (06/19/2022)  Utilities: Not At Risk (06/19/2022)  Alcohol Screen: Low Risk  (08/08/2021)  Depression (PHQ2-9): Low Risk  (04/19/2022)  Financial Resource Strain: Low Risk  (08/08/2021)  Physical Activity: Unknown (08/08/2021)  Social Connections: Socially Integrated (08/08/2021)  Stress: No Stress Concern Present (08/08/2021)  Tobacco Use: Medium Risk (06/18/2022)    Readmission Risk Interventions     No data to display

## 2022-07-06 ENCOUNTER — Inpatient Hospital Stay (HOSPITAL_COMMUNITY): Payer: BC Managed Care – PPO

## 2022-07-06 ENCOUNTER — Encounter (HOSPITAL_COMMUNITY): Payer: Self-pay | Admitting: Hematology

## 2022-07-06 DIAGNOSIS — R569 Unspecified convulsions: Secondary | ICD-10-CM | POA: Diagnosis not present

## 2022-07-06 LAB — BASIC METABOLIC PANEL
Anion gap: 8 (ref 5–15)
BUN: 14 mg/dL (ref 6–20)
CO2: 23 mmol/L (ref 22–32)
Calcium: 7.7 mg/dL — ABNORMAL LOW (ref 8.9–10.3)
Chloride: 101 mmol/L (ref 98–111)
Creatinine, Ser: 0.71 mg/dL (ref 0.44–1.00)
GFR, Estimated: >60 mL/min (ref >60)
Glucose, Bld: 138 mg/dL — ABNORMAL HIGH (ref 70–99)
Potassium: 3.2 mmol/L — ABNORMAL LOW (ref 3.5–5.1)
Sodium: 132 mmol/L — ABNORMAL LOW (ref 135–145)

## 2022-07-06 LAB — CBC
HCT: 20.4 % — ABNORMAL LOW (ref 36.0–46.0)
Hemoglobin: 7 g/dL — ABNORMAL LOW (ref 12.0–15.0)
MCH: 31.1 pg (ref 26.0–34.0)
MCHC: 34.3 g/dL (ref 30.0–36.0)
MCV: 90.7 fL (ref 80.0–100.0)
Platelets: 47 10*3/uL — ABNORMAL LOW (ref 150–400)
RBC: 2.25 MIL/uL — ABNORMAL LOW (ref 3.87–5.11)
RDW: 19.1 % — ABNORMAL HIGH (ref 11.5–15.5)
WBC: 5.2 10*3/uL (ref 4.0–10.5)
nRBC: 0.4 % — ABNORMAL HIGH (ref 0.0–0.2)

## 2022-07-06 MED ORDER — QUETIAPINE FUMARATE 25 MG PO TABS
12.5000 mg | ORAL_TABLET | Freq: Every day | ORAL | Status: DC
  Administered 2022-07-06: 12.5 mg via ORAL
  Filled 2022-07-06: qty 1

## 2022-07-06 MED ORDER — MELATONIN 5 MG PO TABS
5.0000 mg | ORAL_TABLET | ORAL | Status: AC
  Filled 2022-07-06: qty 1

## 2022-07-06 NOTE — Progress Notes (Signed)
PROGRESS NOTE    Joanna Reid   AJG:[id number] DOB: 1965-09-10  DOA: 06/18/2022 Date of Service: 07/06/22 PCP: Lindell Spar, MD  Brief Narrative / Interim history: 56 year old female with refractory multiple myeloma status post relapse, hypertension, gout, GERD, type 2 diabetes mellitus, migraine headaches, stroke with residual aphasia presented to ED for ongoing weakness and lethargy. Last had chemotherapy 2 weeks ago. She has recently received PRBC and platelets in the cancer center. While in the ED patient had acute mental status change, unarousable with full tonic-clonic seizure lasting 2 to 3 minutes which improved with lorazepam. Transferred to [facility] for further neuro evaluation and follow-up as well as continuous EEG.  She has been placed on multiple antiepileptics, now some AEDs have been discontinued or are being tapered down as below under guidance from neurology. Transitioned to oral phenobarbital, Vimpat, Keppra as of 12/23. Mental status continues to improve slowly - complicated by poor sleep cycle and likely concurrent hospital delirium.    Consultants:  Neurology  Oncology   Procedures: None   Assesement and Plan:  Principal Problem:   Generalized weakness Active Problems:   Multiple myeloma (HCC)   Hyperlipidemia   Acute on chronic anemia   Leukopenia   Stroke (cerebrum) -Left parietal lobe, 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    Chronic pain disorder   LVH (left ventricular hypertrophy)   Morbid obesity with BMI of 45.0-49.9, adult (HCC)   Drug-induced polyneuropathy (HCC)   History of CVA with residual deficit   Overactive bladder   Subclinical hypothyroidism   Hypokalemia   Grand mal seizure ([place])   Hypomagnesemia   Status epilepticus (HCC)   New Onset Partial Status Epilepticus in the setting of underlying CVA, acute metabolic encephalopathy  MRI on admission showed a 2 mm acute white matter infarct lateral to the right  caudate head, and also tiny subacute infarcts in the anterior right corona radiata and left occipital lobe white matter.   EEG was concerning for possible seizures.   Workup with a 2D echo showed normal EF 65 to 35%, grade 1 diastolic dysfunction, bubble study was negative, A1c 7.2.  converted convert IV antiepileptics to p.o. -->  Transitioned to oral phenobarbital, Vimpat, Keppra as of 12/23.  Continue aspirin and statin for stroke.     Will need rehab SNF Outpatient plan: F/u with neuro in 3 months. May consider repeat MRI brain at that time if still has language deficits   Pt remains fairly somnolent/confused, neurology reassessing - started low dose seroquel HS, possible transition from [place] to [facility] or repeat EEG pending mental status improvements.  Abdominal pain - secondary to nephrolithiasis(nonobstructive) Incidental liver lesions noted  Patient indicates her previous flank pain is consistent with her history of nephrolithiasis, CTA notable for nonobstructing nephrolithasis -per husband these are somewhat chronic.   Abdominal imaging incidentally showed multiple liver lesions, which will require an MRI as an outpatient.   Hypomagnesemia, hypokalemia  continue to monitor PRN    Pancytopenia  Secondary to myeloma/treament platelets gradually improving and are overall stable.   Received a total of 3 units of packed red blood cells, last one 12/20.   Hemoglobin and platelets are stable this morning but slightly lower than yesterday Monitor CBC Transfuse as needed   Refractory Multiple Myeloma  Followed by Dr. Delton Coombes.  Last chemo given 2 weeks prior to admit  DVT prophylaxis: SCD Pertinent IV fluids/nutrition: dysphagia 2 diet per SLP eval 12/22 Central lines / invasive  devices: R chest port   Code Status: FULL CODE   Disposition: inpatient  TOC needs: SNF placement Barriers to discharge / significant pending items: placement, mentation improvement, neurology clearance  for discharge, may be able to go next few days   Subjective:  No acute issues or events overnight, patient slept somewhat off-and-on overnight, family requesting that we dispense Seroquel later in the evening as the patient had slept from approximately 7 PM to around midnight and then was up for most of the night, they are hoping that a later dose will keep the patient sleep for longer during the night.    Family Communication: Daughter at bedside on rounds   Objective Findings:  Vitals:   07/05/22 2355 07/06/22 0337 07/06/22 0423 07/06/22 0600  BP: (!) 162/81 (!) 152/79 (!) 154/82   Pulse: 95 (!) 106 100   Resp: 16     Temp: 98.1 F (36.7 C) 98.8 F (37.1 C)    TempSrc: Axillary Oral    SpO2: 97% 97% 96%   Weight:    110.3 kg  Height:        Intake/Output Summary (Last 24 hours) at 07/06/2022 0735 Last data filed at 07/05/2022 1800 Gross per 24 hour  Intake 100 ml  Output 200 ml  Net -100 ml    Filed Weights   06/18/22 1728 06/19/22 0500 07/06/22 0600  Weight: 109.3 kg 109.4 kg 110.3 kg    Examination:   General:  Pleasantly resting in bed, No acute distress.  Somnolent but easily arousable, answers orientation questions inappropriately or incorrectly, alert to person only at this time HEENT:  Normocephalic atraumatic.  Sclerae nonicteric, noninjected.  Extraocular movements intact bilaterally. Neck:  Without mass or deformity.  Trachea is midline. Lungs:  Clear to auscultate bilaterally without rhonchi, wheeze, or rales. Heart:  Regular rate and rhythm.  Without murmurs, rubs, or gallops. Abdomen:  Soft, nontender, nondistended.  Without guarding or rebound. Extremities: Without cyanosis, clubbing, edema, or obvious deformity. Vascular:  Dorsalis pedis and posterior tibial pulses palpable bilaterally. Skin:  Warm and dry, no erythema, no ulcerations.  Scheduled Medications:   acyclovir  400 mg Oral BID   allopurinol  300 mg Oral Daily   aspirin  325 mg Oral Daily    Chlorhexidine Gluconate Cloth  6 each Topical Daily   escitalopram  10 mg Oral Daily   fluticasone  2 spray Each Nare Daily   influenza vac split quadrivalent PF  0.5 mL Intramuscular Tomorrow-1000   lacosamide  100 mg Oral BID   levETIRAcetam  1,000 mg Oral BID   magnesium chloride  1 tablet Oral Daily   melatonin  3 mg Oral QHS   melatonin  5 mg Oral STAT   pantoprazole  40 mg Oral Daily   phenobarbital  32.4 mg Oral Daily   pregabalin  200 mg Oral BID   QUEtiapine  12.5 mg Oral Daily   rosuvastatin  20 mg Oral Daily   sodium chloride flush  10-40 mL Intracatheter Q12H   traZODone  50 mg Oral QHS   PRN Medications:  acetaminophen **OR** acetaminophen, albuterol, bisacodyl, diclofenac Sodium, fentaNYL (SUBLIMAZE) injection, LORazepam, ondansetron **OR** ondansetron (ZOFRAN) IV, oxyCODONE, polyvinyl alcohol, sodium chloride flush, sodium phosphate  Antimicrobials:  Anti-infectives (From admission, onward)    Start     Dose/Rate Route Frequency Ordered Stop   06/19/22 1200  vancomycin (VANCOREADY) IVPB 1500 mg/300 mL  Status:  Discontinued        1,500 mg 150  mL/hr over 120 Minutes Intravenous Every 24 hours 06/18/22 1152 06/18/22 1514   06/18/22 2200  acyclovir (ZOVIRAX) tablet 400 mg       Note to Pharmacy: TAKE (1) TABLET BY MOUTH TWICE DAILY.     400 mg Oral 2 times daily 06/18/22 1514     06/18/22 2000  ceFEPIme (MAXIPIME) 2 g in sodium chloride 0.9 % 100 mL IVPB  Status:  Discontinued        2 g 200 mL/hr over 30 Minutes Intravenous Every 8 hours 06/18/22 1152 06/18/22 1514   06/18/22 1200  vancomycin (VANCOREADY) IVPB 2000 mg/400 mL        2,000 mg 200 mL/hr over 120 Minutes Intravenous  Once 06/18/22 1149 06/18/22 1451   06/18/22 1145  ceFEPIme (MAXIPIME) 2 g in sodium chloride 0.9 % 100 mL IVPB        2 g 200 mL/hr over 30 Minutes Intravenous  Once 06/18/22 1144 06/18/22 1304   06/18/22 1145  metroNIDAZOLE (FLAGYL) IVPB 500 mg        500 mg 100 mL/hr over 60  Minutes Intravenous  Once 06/18/22 1144 06/18/22 1431   06/18/22 1145  vancomycin (VANCOCIN) IVPB 1000 mg/200 mL premix  Status:  Discontinued        1,000 mg 200 mL/hr over 60 Minutes Intravenous  Once 06/18/22 1144 06/18/22 1149       Data Reviewed: I have personally reviewed following labs and imaging studies  CBC: Recent Labs  Lab 06/30/22 0243 07/03/22 0847 07/06/22 0620  WBC 4.3 5.6 5.2  HGB 7.6* 8.2* 7.0*  HCT 23.1* 25.1* 20.4*  MCV 90.6 91.9 90.7  PLT 50* 58* 47*    Basic Metabolic Panel: Recent Labs  Lab 06/30/22 0243 07/01/22 0320 07/02/22 0455 07/03/22 0847  NA 133* 133* 131* 130*  K 3.5 3.6 3.9 4.1  CL 100 101 99 99  CO2 _0 GLUCOSE 119* 107* 109* 118*  BUN _1 CREATININE 0.45 0.57 0.53 0.57  CALCIUM 7.6* 7.6* 7.7* 8.0*  MG 1.4* 1.6* 1.5*  --     GFR: Estimated Creatinine Clearance: 94.2 mL/min (by C-G formula based on SCr of 0.57 mg/dL). Liver Function Tests: Recent Labs  Lab 06/30/22 0243  AST 25  ALT 13  ALKPHOS 197*  BILITOT 0.6  PROT 3.8*  ALBUMIN 1.7*    No results for input(s): "LIPASE", "AMYLASE" in the last 168 hours. No results for input(s): "AMMONIA" in the last 168 hours.  Coagulation Profile: No results for input(s): "INR", "PROTIME" in the last 168 hours. Cardiac Enzymes: No results for input(s): "CKTOTAL", "CKMB", "CKMBINDEX", "TROPONINI" in the last 168 hours. BNP (last 3 results) No results for input(s): "PROBNP" in the last 8760 hours. HbA1C: No results for input(s): "HGBA1C" in the last 72 hours. CBG: No results for input(s): "GLUCAP" in the last 168 hours.  Lipid Profile: No results for input(s): "CHOL", "HDL", "LDLCALC", "TRIG", "CHOLHDL", "LDLDIRECT" in the last 72 hours. Thyroid Function Tests: No results for input(s): "TSH", "T4TOTAL", "FREET4", "T3FREE", "THYROIDAB" in the last 72 hours. Anemia Panel: No results for input(s): "VITAMINB12", "FOLATE", "FERRITIN", "TIBC", "IRON", "RETICCTPCT"  in the last 72 hours. Most Recent Urinalysis On File:     Component Value Date/Time   COLORURINE YELLOW 06/18/2022 1342   APPEARANCEUR CLOUDY (A) 06/18/2022 1342   APPEARANCEUR Cloudy (A) 05/02/2022 1113   LABSPEC 1.028 06/18/2022 1342   PHURINE 5.0 06/18/2022 1342   GLUCOSEU NEGATIVE 06/18/2022 1342  HGBUR NEGATIVE 06/18/2022 1342   BILIRUBINUR NEGATIVE 06/18/2022 1342   BILIRUBINUR Negative 05/02/2022 1113   KETONESUR 5 (A) 06/18/2022 1342   PROTEINUR 30 (A) 06/18/2022 1342   UROBILINOGEN 0.2 11/27/2021 1534   UROBILINOGEN 0.2 05/07/2012 0829   NITRITE NEGATIVE 06/18/2022 1342   LEUKOCYTESUR NEGATIVE 06/18/2022 1342   Sepsis Labs: _0 (procalcitonin:4,lacticidven:4)  No results found for this or any previous visit (from the past 240 hour(s)).       Radiology Studies: ECHOCARDIOGRAM COMPLETE BUBBLE STUDY  Result Date: 06/19/2022    ECHOCARDIOGRAM REPORT   Patient Name:   YOSHIKA VENSEL Date of Exam: 06/19/2022 Medical Rec #:  [id number]        Height:       64.0 in Accession #:    [id number]       Weight:       241.2 lb Date of Birth:  1965/10/29        BSA:          2.118 m Patient Age:    24 years         BP:           161/92 mmHg Patient Gender: F                HR:           86 bpm. Exam Location:  [facility] Procedure: 2D Echo, Cardiac Doppler, Color Doppler and Saline Contrast Bubble            Study Indications:    Stroke  History:        Patient has prior history of Echocardiogram examinations, most                 recent 01/16/2022. Stroke; Risk Factors:Hypertension, Diabetes,                 Dyslipidemia and Former Smoker. Multiple myleoma, Port-a-cath in                 place.  Sonographer:    Wenda Low Referring Phys: [id number] JAN A MANSY  Sonographer Comments: Patient is obese. IMPRESSIONS  1. Left ventricular ejection fraction, by estimation, is 60 to 65%. The left ventricle has normal function. The left ventricle has no regional wall motion abnormalities.  There is moderate asymmetric left ventricular hypertrophy of the septal segment. Left ventricular diastolic parameters are consistent with Grade I diastolic dysfunction (impaired relaxation).  2. Right ventricular systolic function is normal. The right ventricular size is normal. There is normal pulmonary artery systolic pressure. The estimated right ventricular systolic pressure is 42.3 mmHg.  3. The mitral valve is grossly normal. Trivial mitral valve regurgitation.  4. The aortic valve is tricuspid. Aortic valve regurgitation is mild. Aortic regurgitation PHT measures 603 msec.  5. Aortic dilatation noted. There is mild dilatation of the ascending aorta, measuring 41 mm.  6. The inferior vena cava is normal in size with <50% respiratory variability, suggesting right atrial pressure of 8 mmHg.  7. Agitated saline contrast bubble study was negative, with no evidence of right to left interatrial shunt. Comparison(s): Prior images reviewed side by side. LVEF remains normal range at 60-65%. Mildly dilated ascending aorta with mild aortic regurgitation. FINDINGS  Left Ventricle: Left ventricular ejection fraction, by estimation, is 60 to 65%. The left ventricle has normal function. The left ventricle has no regional wall motion abnormalities. The left ventricular internal cavity size was normal in size. There is  moderate asymmetric left ventricular  hypertrophy of the septal segment. Left ventricular diastolic parameters are consistent with Grade I diastolic dysfunction (impaired relaxation). Right Ventricle: The right ventricular size is normal. No increase in right ventricular wall thickness. Right ventricular systolic function is normal. There is normal pulmonary artery systolic pressure. The tricuspid regurgitant velocity is 2.20 m/s, and  with an assumed right atrial pressure of 8 mmHg, the estimated right ventricular systolic pressure is 38.3 mmHg. Left Atrium: Left atrial size was normal in size. Right Atrium:  Right atrial size was normal in size. Pericardium: There is no evidence of pericardial effusion. Mitral Valve: The mitral valve is grossly normal. Trivial mitral valve regurgitation. MV peak gradient, 4.1 mmHg. The mean mitral valve gradient is 2.0 mmHg. Tricuspid Valve: The tricuspid valve is grossly normal. Tricuspid valve regurgitation is trivial. Aortic Valve: The aortic valve is tricuspid. Aortic valve regurgitation is mild. Aortic regurgitation PHT measures 603 msec. Aortic valve mean gradient measures 5.0 mmHg. Aortic valve peak gradient measures 12.4 mmHg. Aortic valve area, by VTI measures 2.95 cm. Pulmonic Valve: The pulmonic valve was grossly normal. Pulmonic valve regurgitation is trivial. Aorta: The aortic root is normal in size and structure and aortic dilatation noted. There is mild dilatation of the ascending aorta, measuring 41 mm. Venous: The inferior vena cava is normal in size with less than 50% respiratory variability, suggesting right atrial pressure of 8 mmHg. IAS/Shunts: No atrial level shunt detected by color flow Doppler. Agitated saline contrast was given intravenously to evaluate for intracardiac shunting. Agitated saline contrast bubble study was negative, with no evidence of any interatrial shunt.  LEFT VENTRICLE PLAX 2D LVIDd:         5.20 cm   Diastology LVIDs:         3.30 cm   LV e' medial:    5.22 cm/s LV PW:         1.30 cm   LV E/e' medial:  15.2 LV IVS:        1.50 cm   LV e' lateral:   7.62 cm/s LVOT diam:     2.10 cm   LV E/e' lateral: 10.4 LV SV:         93 LV SV Index:   44 LVOT Area:     3.46 cm  RIGHT VENTRICLE RV Basal diam:  2.70 cm RV Mid diam:    2.20 cm RV S prime:     14.80 cm/s LEFT ATRIUM             Index        RIGHT ATRIUM           Index LA diam:        4.30 cm 2.03 cm/m   RA Area:     13.20 cm LA Vol (A2C):   62.0 ml 29.27 ml/m  RA Volume:   24.40 ml  11.52 ml/m LA Vol (A4C):   46.4 ml 21.90 ml/m LA Biplane Vol: 54.2 ml 25.59 ml/m  AORTIC VALVE                     PULMONIC VALVE AV Area (Vmax):    2.81 cm     PV Vmax:       1.29 m/s AV Area (Vmean):   3.00 cm     PV Peak grad:  6.7 mmHg AV Area (VTI):     2.95 cm AV Vmax:           176.00 cm/s AV Vmean:  95.700 cm/s AV VTI:            0.315 m AV Peak Grad:      12.4 mmHg AV Mean Grad:      5.0 mmHg LVOT Vmax:         143.00 cm/s LVOT Vmean:        83.000 cm/s LVOT VTI:          0.268 m LVOT/AV VTI ratio: 0.85 AI PHT:            603 msec  AORTA Ao Root diam: 3.70 cm Ao Asc diam:  4.10 cm MITRAL VALVE                TRICUSPID VALVE MV Area (PHT): 5.06 cm     TR Peak grad:   19.4 mmHg MV Area VTI:   3.14 cm     TR Vmax:        220.00 cm/s MV Peak grad:  4.1 mmHg MV Mean grad:  2.0 mmHg     SHUNTS MV Vmax:       1.01 m/s     Systemic VTI:  0.27 m MV Vmean:      69.0 cm/s    Systemic Diam: 2.10 cm MV Decel Time: 150 msec MV E velocity: 79.50 cm/s MV A velocity: 114.00 cm/s MV E/A ratio:  0.70 Rozann Lesches MD Electronically signed by Rozann Lesches MD Signature Date/Time: 06/19/2022/10:34:16 AM    Final    MR BRAIN W WO CONTRAST  Result Date: 06/18/2022 CLINICAL DATA:  Provided history: Neuro deficit, acute, stroke suspected. Mental status change, unknown cause. EXAM: MRI HEAD WITHOUT AND WITH CONTRAST MRA HEAD WITHOUT CONTRAST TECHNIQUE: Multiplanar, multi-echo pulse sequences of the brain and surrounding structures were acquired without and with intravenous contrast. Angiographic images of the Circle of Willis were acquired using MRA technique without intravenous contrast. CONTRAST:  17m GADAVIST GADOBUTROL 1 MMOL/ML IV SOLN COMPARISON:  Brain MRI 06/12/2022. CT angiogram head/neck 12/26/2019. FINDINGS: MRI HEAD FINDINGS Intermittently motion degraded examination, limiting evaluation. Most notably, the axial T2 FLAIR sequence is moderately motion degraded, the coronal T2 and T2 FLAIR sequences through the hippocampi are mild to moderately motion degraded and the sagittal T1 weighted postcontrast  sequence is moderately motion degraded. Brain: No age advanced or lobar predominant parenchymal atrophy. Redemonstrated moderate-sized chronic cortical/subcortical infarct within the left parietal lobe (posterior left MCA territory and left MCA/PCA watershed territory). Chronic hemosiderin deposition within portions of this infarct territory. New from the prior brain MRI of 06/12/2022, there is cortical diffusion-weighted signal abnormality along the margins of this chronic infarct (within the right parietal lobe, at the left temporoparietal junction and within the lateral left occipital lobe) (for instance as seen on series 9, images 17 and 23). Subtle diffusion weighted signal abnormality is also suspected within the posteromedial left thalamus (series 9, image 17). Findings are suspicious for seizure related changes, although acute infarcts are also a consideration. Redemonstrated tiny foci of diffusion-weighted signal abnormality and T2 FLAIR hyperintense signal abnormality measuring up to 3 mm within the anterior right corona radiata (series 11, image 17) and left occipital lobe white matter (series 9, image 16). These were present on the prior brain MRI of 06/12/2022, and likely reflect small subacute infarcts (right MCA territory and left PCA territory, respectively). New 2 mm acute white matter infarct immediately lateral to the right caudate nucleus (series 9, image 19). Background multifocal T2 FLAIR hyperintense signal abnormality within the cerebral white matter (mild) and  within the pons (moderate). Unchanged small chronic infarcts within the bilateral cerebellar hemispheres. No evidence of an intracranial mass. No extra-axial fluid collection. No midline shift. No definite pathologic intracranial enhancement. Vascular: Maintained flow voids within the proximal large arterial vessels. Skull and upper cervical spine: Heterogeneous bone marrow signal with numerous foci of osseous enhancement. These  findings are compatible with widespread multiple myeloma. Incompletely assessed cervical spondylosis. Sinuses/Orbits: No mass or acute finding within the imaged orbits. No significant paranasal sinus disease. Other: Trace fluid within the bilateral mastoid air cells. MRA HEAD FINDINGS Mildly motion degraded exam. Anterior circulation: The intracranial internal carotid arteries are patent. Mild atherosclerotic irregularity of both vessels. The M1 middle cerebral arteries are patent. Atherosclerotic irregularity of the M2 and more distal MCA vessels, bilaterally. No M2 proximal branch occlusion or high-grade proximal stenosis. The anterior cerebral arteries are patent. Atherosclerotic irregularity of both vessels. Most notably, there is an apparent moderate/severe stenosis within the proximal right ACA A2 segment, which is new from the prior CTA of 12/26/2019. Posterior circulation: The intracranial vertebral arteries are patent. The basilar artery is patent. Mild atherosclerotic irregularity of the basilar artery. The posterior cerebral arteries are patent. Atherosclerotic irregularity of both vessels. Most notably, there are sites of up to moderate stenosis within left PCA branches at P2/P3 junction, progressed (series 1033, image 2) and a progressive moderate stenosis within a right PCA branch at the P2/P3 junction. A left posterior communicating artery is present. The right posterior communicating artery is diminutive or absent Anatomic variants: As described. MRI brain impression #2 and #3 will be called to the ordering clinician or representative by the Radiologist Assistant, and communication documented in the PACS or [employer]. IMPRESSION: MRI brain: 1. Intermittently motion degraded exam. 2. Foci of cortical diffusion-weighted signal abnormality along the margins of a chronic left parietal lobe infarct (within the left parietal lobe, at the left temporoparietal junction and within the lateral left  occipital lobe). Subtle diffusion-weighted signal abnormality is also suspected within the posteromedial left thalamus. These findings are suspicious for seizure-related changes. However, alternatively, these may reflect acute infarcts. 3. New 2 mm acute white matter infarct immediately lateral to the right caudate head. 4. Redemonstrated tiny subacute infarcts within the anterior right corona radiata and left occipital lobe white matter (right MCA and left PCA vascular territories, respectively). 5. Background chronic small vessel ischemic changes, as described and unchanged from the recent prior brain MRI of 06/12/2022. 6. Redemonstrated small chronic infarcts within both cerebellar hemispheres. 7. Widespread multiple myeloma. MRA head: 1. No intracranial large vessel occlusion is identified. 2. Intracranial atherosclerotic disease with multifocal stenoses, most notably as follows. 3. Moderate/severe stenosis within the right ACA proximal A2 segment, new from the prior CTA head/neck of 12/26/2019. 4. Sites of up to moderate stenosis within left PCA branches at the P2/P3 junction, progressed. 5. Moderate stenosis within a right PCA branch at the P2/P3 junction, progressed. Electronically Signed   By: Kellie Simmering D.O.   On: 06/18/2022 19:39   MR ANGIO HEAD WO CONTRAST  Result Date: 06/18/2022 CLINICAL DATA:  Provided history: Neuro deficit, acute, stroke suspected. Mental status change, unknown cause. EXAM: MRI HEAD WITHOUT AND WITH CONTRAST MRA HEAD WITHOUT CONTRAST TECHNIQUE: Multiplanar, multi-echo pulse sequences of the brain and surrounding structures were acquired without and with intravenous contrast. Angiographic images of the Circle of Willis were acquired using MRA technique without intravenous contrast. CONTRAST:  77m GADAVIST GADOBUTROL 1 MMOL/ML IV SOLN COMPARISON:  Brain MRI 06/12/2022. CT  angiogram head/neck 12/26/2019. FINDINGS: MRI HEAD FINDINGS Intermittently motion degraded examination,  limiting evaluation. Most notably, the axial T2 FLAIR sequence is moderately motion degraded, the coronal T2 and T2 FLAIR sequences through the hippocampi are mild to moderately motion degraded and the sagittal T1 weighted postcontrast sequence is moderately motion degraded. Brain: No age advanced or lobar predominant parenchymal atrophy. Redemonstrated moderate-sized chronic cortical/subcortical infarct within the left parietal lobe (posterior left MCA territory and left MCA/PCA watershed territory). Chronic hemosiderin deposition within portions of this infarct territory. New from the prior brain MRI of 06/12/2022, there is cortical diffusion-weighted signal abnormality along the margins of this chronic infarct (within the right parietal lobe, at the left temporoparietal junction and within the lateral left occipital lobe) (for instance as seen on series 9, images 17 and 23). Subtle diffusion weighted signal abnormality is also suspected within the posteromedial left thalamus (series 9, image 17). Findings are suspicious for seizure related changes, although acute infarcts are also a consideration. Redemonstrated tiny foci of diffusion-weighted signal abnormality and T2 FLAIR hyperintense signal abnormality measuring up to 3 mm within the anterior right corona radiata (series 11, image 17) and left occipital lobe white matter (series 9, image 16). These were present on the prior brain MRI of 06/12/2022, and likely reflect small subacute infarcts (right MCA territory and left PCA territory, respectively). New 2 mm acute white matter infarct immediately lateral to the right caudate nucleus (series 9, image 19). Background multifocal T2 FLAIR hyperintense signal abnormality within the cerebral white matter (mild) and within the pons (moderate). Unchanged small chronic infarcts within the bilateral cerebellar hemispheres. No evidence of an intracranial mass. No extra-axial fluid collection. No midline shift. No  definite pathologic intracranial enhancement. Vascular: Maintained flow voids within the proximal large arterial vessels. Skull and upper cervical spine: Heterogeneous bone marrow signal with numerous foci of osseous enhancement. These findings are compatible with widespread multiple myeloma. Incompletely assessed cervical spondylosis. Sinuses/Orbits: No mass or acute finding within the imaged orbits. No significant paranasal sinus disease. Other: Trace fluid within the bilateral mastoid air cells. MRA HEAD FINDINGS Mildly motion degraded exam. Anterior circulation: The intracranial internal carotid arteries are patent. Mild atherosclerotic irregularity of both vessels. The M1 middle cerebral arteries are patent. Atherosclerotic irregularity of the M2 and more distal MCA vessels, bilaterally. No M2 proximal branch occlusion or high-grade proximal stenosis. The anterior cerebral arteries are patent. Atherosclerotic irregularity of both vessels. Most notably, there is an apparent moderate/severe stenosis within the proximal right ACA A2 segment, which is new from the prior CTA of 12/26/2019. Posterior circulation: The intracranial vertebral arteries are patent. The basilar artery is patent. Mild atherosclerotic irregularity of the basilar artery. The posterior cerebral arteries are patent. Atherosclerotic irregularity of both vessels. Most notably, there are sites of up to moderate stenosis within left PCA branches at P2/P3 junction, progressed (series 1033, image 2) and a progressive moderate stenosis within a right PCA branch at the P2/P3 junction. A left posterior communicating artery is present. The right posterior communicating artery is diminutive or absent Anatomic variants: As described. MRI brain impression #2 and #3 will be called to the ordering clinician or representative by the Radiologist Assistant, and communication documented in the PACS or [employer]. IMPRESSION: MRI brain: 1. Intermittently  motion degraded exam. 2. Foci of cortical diffusion-weighted signal abnormality along the margins of a chronic left parietal lobe infarct (within the left parietal lobe, at the left temporoparietal junction and within the lateral left occipital lobe). Subtle diffusion-weighted  signal abnormality is also suspected within the posteromedial left thalamus. These findings are suspicious for seizure-related changes. However, alternatively, these may reflect acute infarcts. 3. New 2 mm acute white matter infarct immediately lateral to the right caudate head. 4. Redemonstrated tiny subacute infarcts within the anterior right corona radiata and left occipital lobe white matter (right MCA and left PCA vascular territories, respectively). 5. Background chronic small vessel ischemic changes, as described and unchanged from the recent prior brain MRI of 06/12/2022. 6. Redemonstrated small chronic infarcts within both cerebellar hemispheres. 7. Widespread multiple myeloma. MRA head: 1. No intracranial large vessel occlusion is identified. 2. Intracranial atherosclerotic disease with multifocal stenoses, most notably as follows. 3. Moderate/severe stenosis within the right ACA proximal A2 segment, new from the prior CTA head/neck of 12/26/2019. 4. Sites of up to moderate stenosis within left PCA branches at the P2/P3 junction, progressed. 5. Moderate stenosis within a right PCA branch at the P2/P3 junction, progressed. Electronically Signed   By: Kellie Simmering D.O.   On: 06/18/2022 19:39   DG Chest Port 1 View  Result Date: 06/18/2022 CLINICAL DATA:  Possible sepsis EXAM: PORTABLE CHEST 1 VIEW COMPARISON:  06/08/22 CXR FINDINGS: Right chest part with the tip at the cavoatrial junction. Unchanged cardiac and mediastinal contours. No pleural effusion. No pneumothorax. Bibasilar atelectasis. Visualized upper abdomen is unremarkable.No displaced rib fractures. IMPRESSION: Bibasilar atelectasis. Electronically Signed   By: Marin Roberts M.D.   On: 06/18/2022 12:12    LOS: 18 days   Little Ishikawa, DO [facility] 07/06/2022, 7:35 AM   If 7PM-7AM, please contact night coverage [email or web address]

## 2022-07-06 NOTE — Progress Notes (Signed)
Inpatient Rehab Admissions Coordinator:    Patient is not at a level where she can tolerate intensive rehab program. She will need SNF at this time. Will sign off.   Rehab Admissons Coordinator [place], [place], [place] [phone]

## 2022-07-06 NOTE — TOC Progression Note (Addendum)
Transition of Care [facility]) - Progression Note    Patient Details  Name: Joanna Reid MRN: [id number] Date of Birth: 1966-06-23  Transition of Care [facility]) CM/SW Contact  Pollie Friar, RN Phone Number: 07/06/2022, 1:07 PM  Clinical Narrative:    Spouse asking to see if [facility] in [place] would offer a bed. CM has sent the referral to [facility]. Awaiting review.  TOC following.   1520: CM spoke to pts spouse he is also considering taking her home. He states he would have 24 hour care for her. He needs a bed/ wheelchair/ lift if he does decide on home.  CM called [facility] and they have not reviewed the referral but will.   Expected Discharge Plan: [place] Barriers to Discharge: Continued Medical Work up  Expected Discharge Plan and Services In-house Referral: Clinical Social Work Discharge Planning Services: CM Consult Post Acute Care Choice: [place] Living arrangements for the past 2 months: [place] Determinants of Health (SDOH) Interventions SDOH Screenings   Food Insecurity: No Food Insecurity (06/19/2022)  Housing: Low Risk  (06/19/2022)  Transportation Needs: No Transportation Needs (06/19/2022)  Utilities: Not At Risk (06/19/2022)  Alcohol Screen: Low Risk  (08/08/2021)  Depression (PHQ2-9): Low Risk  (04/19/2022)  Financial Resource Strain: Low Risk  (08/08/2021)  Physical Activity: Unknown (08/08/2021)  Social Connections: Socially Integrated (08/08/2021)  Stress: No Stress Concern Present (08/08/2021)  Tobacco Use: Medium Risk (06/18/2022)    Readmission Risk Interventions     No data to display

## 2022-07-06 NOTE — Progress Notes (Signed)
PT Cancellation Note  Patient Details Name: Joanna Reid MRN: [id number] DOB: Nov 11, 1965   Cancelled Treatment:    Reason Eval/Treat Not Completed: Patient at procedure or test/unavailable - EEG technician in room placing EEG, PT to return as able.   Stacie Glaze, PT DPT Acute Rehabilitation Services Pager [phone]  Office [phone]    Louis Matte 07/06/2022, 3:31 PM

## 2022-07-06 NOTE — Plan of Care (Signed)
  Problem: Education: Goal: Knowledge of General Education information will improve Description: Including pain rating scale, medication(s)/side effects and non-pharmacologic comfort measures Outcome: Progressing   Problem: Health Behavior/Discharge Planning: Goal: Ability to manage health-related needs will improve Outcome: Progressing   Problem: Clinical Measurements: Goal: Ability to maintain clinical measurements within normal limits will improve Outcome: Progressing Goal: Will remain free from infection Outcome: Progressing Goal: Diagnostic test results will improve Outcome: Progressing Goal: Respiratory complications will improve Outcome: Progressing Goal: Cardiovascular complication will be avoided Outcome: Progressing   Problem: Activity: Goal: Risk for activity intolerance will decrease Outcome: Progressing   Problem: Nutrition: Goal: Adequate nutrition will be maintained Outcome: Progressing   Problem: Coping: Goal: Level of anxiety will decrease Outcome: Progressing   Problem: Elimination: Goal: Will not experience complications related to bowel motility Outcome: Progressing Goal: Will not experience complications related to urinary retention Outcome: Progressing   Problem: Pain Managment: Goal: General experience of comfort will improve Outcome: Progressing   Problem: Safety: Goal: Ability to remain free from injury will improve Outcome: Progressing   Problem: Skin Integrity: Goal: Risk for impaired skin integrity will decrease Outcome: Progressing   Problem: Education: Goal: Knowledge of disease or condition will improve Outcome: Progressing Goal: Knowledge of secondary prevention will improve (MUST DOCUMENT ALL) Outcome: Progressing Goal: Knowledge of patient specific risk factors will improve [place]ta [place] N/A or DELETE if not current risk factor) Outcome: Progressing   Problem: Ischemic Stroke/TIA Tissue Perfusion: Goal: Complications of ischemic  stroke/TIA will be minimized Outcome: Progressing   Problem: Coping: Goal: Will verbalize positive feelings about self Outcome: Progressing Goal: Will identify appropriate support needs Outcome: Progressing   Problem: Self-Care: Goal: Ability to participate in self-care as condition permits will improve Outcome: Progressing Goal: Verbalization of feelings and concerns over difficulty with self-care will improve Outcome: Progressing Goal: Ability to communicate needs accurately will improve Outcome: Progressing   Problem: Nutrition: Goal: Risk of aspiration will decrease Outcome: Progressing   Problem: Education: Goal: Expressions of having a comfortable level of knowledge regarding the disease process will increase Outcome: Progressing   Problem: Coping: Goal: Ability to adjust to condition or change in health will improve Outcome: Progressing Goal: Ability to identify appropriate support needs will improve Outcome: Progressing   Problem: Health Behavior/Discharge Planning: Goal: Compliance with prescribed medication regimen will improve Outcome: Progressing   Problem: Medication: Goal: Risk for medication side effects will decrease Outcome: Progressing   Problem: Clinical Measurements: Goal: Complications related to the disease process, condition or treatment will be avoided or minimized Outcome: Progressing Goal: Diagnostic test results will improve Outcome: Progressing   Problem: Safety: Goal: Verbalization of understanding the information provided will improve Outcome: Progressing   Problem: Self-Concept: Goal: Level of anxiety will decrease Outcome: Progressing Goal: Ability to verbalize feelings about condition will improve Outcome: Progressing

## 2022-07-06 NOTE — Progress Notes (Signed)
Joanna Reid, Joanna Reid [id number]   CLINIC:  Medical Oncology/Hematology  PCP:  Joanna Spar, MD [address] / [place] [address] [phone]   REASON FOR VISIT:  Follow-up for multiple myeloma  PRIOR THERAPY:  1. RVD x 4 cycles from 11/01/2017 through 01/14/2018. 2. Stem cell transplant on 02/20/2018. 3. Velcade from 06/11/2018 to 12/22/2019.  NGS Results: not done  CURRENT THERAPY: Daratumumab, carfilzomib and dexamethasone  BRIEF ONCOLOGIC HISTORY:  Oncology History  Multiple myeloma ([place])  10/29/2017 Initial Diagnosis   Multiple myeloma ([place])   11/01/2017 - 01/24/2018 Chemotherapy   The patient had dexamethasone (DECADRON) 4 MG tablet, 1 of 1 cycle, Start date: --, End date: -- lenalidomide (REVLIMID) 25 MG capsule, 1 of 1 cycle, Start date: --, End date: -- bortezomib SQ (VELCADE) chemo injection 3 mg, 1.3 mg/m2 = 3 mg, Subcutaneous,  Once, 5 of 5 cycles Administration: 3 mg (11/01/2017), 3 mg (11/08/2017), 3 mg (11/05/2017), 3 mg (11/22/2017), 3 mg (11/12/2017), 3 mg (11/29/2017), 3 mg (11/26/2017), 3 mg (12/03/2017), 3 mg (12/13/2017), 3 mg (12/20/2017), 3 mg (12/17/2017), 3 mg (12/24/2017), 3 mg (01/03/2018), 3 mg (01/10/2018), 3 mg (01/07/2018), 3 mg (01/14/2018), 3 mg (01/24/2018)  for chemotherapy treatment.    06/11/2018 - 12/20/2021 Chemotherapy   Patient is on Treatment Plan : MYELOMA MAINTENANCE Bortezomib SQ q 7d x 6 weeks, two weeks off then q 14d     01/25/2022 - 02/08/2022 Chemotherapy   Patient is on Treatment Plan : MYELOMA RELAPSED/REFRACTORY Carfilzomib (20/70) + Daratumumab SQ + Dexamethasone (20/40) DaraKd q28d     01/25/2022 - 04/05/2022 Chemotherapy   Patient is on Treatment Plan : MYELOMA RELAPSED/REFRACTORY Carfilzomib (20/70) + Daratumumab SQ + Dexamethasone (20/40) DaraKd q28d       CANCER STAGING:  Cancer Staging  Multiple myeloma ([place]) Staging form: Plasma Cell Myeloma and Plasma Cell Disorders, AJCC 8th Edition -  Clinical: No stage assigned - Unsigned - Clinical: No stage assigned - Unsigned   INTERVAL HISTORY:  Joanna Reid, a 56 y.o. female, seen for follow-up of multiple myeloma.  She started selinexor and dexamethasone on 05/12/2022.  On Monday and Tuesday she felt lethargic and was confused.  Last dose of selinexor was on 06/04/2022.  She reports hurting in her head and back.  REVIEW OF SYSTEMS:  Review of Systems  Constitutional:  Negative for appetite change and fatigue.  Respiratory:  Positive for cough.   Gastrointestinal:  Negative for diarrhea.  Musculoskeletal:  Positive for back pain.  Neurological:  Positive for headaches.  All other systems reviewed and are negative.   PAST MEDICAL/SURGICAL HISTORY:  Past Medical History:  Diagnosis Date   Acid reflux    Allergic rhinitis    Cancer ([place])    multiple myeloma   Diabetes mellitus    type 2   Gout    Gout    H/o COVID-19--- was Positive 09/17/2019, Negative 12/23/19 AND also Neg on  12/26/19 12/26/2019   HBP (high blood pressure)    History of kidney stones    Migraines    Multiple myeloma ([place]) 10/29/2017   Past Surgical History:  Procedure Laterality Date   BREAST CYST EXCISION Left    2009 no visible scar on skin   CESAREAN SECTION     COLONOSCOPY WITH PROPOFOL N/A 12/25/2019   Procedure: COLONOSCOPY WITH PROPOFOL;  Surgeon: Rogene Houston, MD;  [facility];  Service: Endoscopy;  Laterality: N/A;  [place] LITHOTRIPSY Left 10/10/2017   Procedure: LEFT EXTRACORPOREAL SHOCK WAVE LITHOTRIPSY (ESWL);  Surgeon: Bjorn Loser, MD;  Location: [facility];  Service: Urology;  Laterality: Left;   EYE SURGERY     HEMORRHOID SURGERY N/A 11/19/2012   Procedure: HEMORRHOIDECTOMY;  Surgeon: Jamesetta So, MD;  Location: [facility];  Service: General;  Laterality: N/A;   IR IMAGING GUIDED PORT INSERTION  01/18/2022   kidney stones  1998   LAPAROSCOPIC UNILATERAL SALPINGO OOPHERECTOMY  05/14/2012    Procedure: LAPAROSCOPIC UNILATERAL SALPINGO OOPHORECTOMY;  Surgeon: [name], MD;  Location: [facility];  Service: Gynecology;  Laterality: Right;  laparoscopic right salpingo-oophorectomy   PARTIAL HYSTERECTOMY     POLYPECTOMY  12/25/2019   Procedure: POLYPECTOMY;  Surgeon: Rogene Houston, MD;  [facility]: [facility];  Service: Endoscopy;;   TONSILECTOMY, ADENOIDECTOMY, BILATERAL MYRINGOTOMY AND TUBES     VESICOVAGINAL FISTULA CLOSURE W/ TAH      SOCIAL HISTORY:  Social History   Socioeconomic History   Marital status: Married    Spouse name: Joanna Reid   Number of children: 1   Years of education: 12   Highest education level: Some college, no degree  Occupational History    Employer: [employer]  Tobacco Use   Smoking status: Former    Types: Cigarettes    Quit date: 02/27/1999    Years since quitting: 23.3   Smokeless tobacco: Never   Tobacco comments:    socially   Vaping Use   Vaping Use: Never used  Substance and Sexual Activity   Alcohol use: No    Alcohol/week: 0.0 standard drinks of alcohol   Drug use: No   Sexual activity: Yes    Birth control/protection: Surgical    Comment: hyst  Other Topics Concern   Not on file  Social History Narrative   Not on file   Social Determinants of Health   Financial Resource Strain: Low Risk  (08/08/2021)   Overall Financial Resource Strain (CARDIA)    Difficulty of Paying Living Expenses: Not hard at all  Food Insecurity: No Food Insecurity (06/19/2022)   Hunger Vital Sign    Worried About Running Out of Food in the Last Year: Never true    Ran Out of Food in the Last Year: Never true  Transportation Needs: No Transportation Needs (06/19/2022)   PRAPARE - Hydrologist (Medical): No    Lack of Transportation (Non-Medical): No  Physical Activity: Unknown (08/08/2021)   Exercise Vital Sign    Days of Exercise per Week: Not on file    Minutes of Exercise per Session: 10 min  Stress: No Stress Concern  Present (08/08/2021)   [place]    Feeling of Stress : Not at all  Social Connections: [place] (08/08/2021)   Social Connection and Isolation Panel [NHANES]    Frequency of Communication with Friends and Family: More than three times a week    Frequency of Social Gatherings with Friends and Family: Twice a week    Attends Religious Services: More than 4 times per year    Active Member of [employer] or Organizations: Yes    Attends Archivist Meetings: More than 4 times per year    Marital Status: Married  Human resources officer Violence: Not At Risk (06/19/2022)   Humiliation, Afraid, Rape, and Kick questionnaire    Fear of Current or Ex-Partner: No    Emotionally  Abused: No    Physically Abused: No    Sexually Abused: No    FAMILY HISTORY:  Family History  Problem Relation Age of Onset   Arthritis Other    Cancer Other    Diabetes Other    Hypertension Mother    Dementia Mother    Diabetes Father    ALS Father    Diabetes Brother    Hypertension Brother    Cancer Paternal Aunt    COPD Maternal Grandmother    Cancer Maternal Grandfather    Anesthesia problems Paternal Grandfather     CURRENT MEDICATIONS:  No current facility-administered medications for this visit.   No current outpatient medications on file.   Facility-Administered Medications Ordered in Other Visits  Medication Dose Route Frequency Provider Last Rate Last Admin   acetaminophen (TYLENOL) tablet 650 mg  650 mg Oral Q6H PRN Wynetta Emery, Clanford L, MD   650 mg at 07/06/22 1009   Or   acetaminophen (TYLENOL) suppository 650 mg  650 mg Rectal Q6H PRN Johnson, Clanford L, MD       acyclovir (ZOVIRAX) tablet 400 mg  400 mg Oral BID Johnson, Clanford L, MD   400 mg at 07/06/22 1007   albuterol (PROVENTIL) (2.5 MG/3ML) 0.083% nebulizer solution 2.5 mg  2.5 mg Inhalation Q4H PRN Johnson, Clanford L, MD       allopurinol (ZYLOPRIM) tablet  300 mg  300 mg Oral Daily Johnson, Clanford L, MD   300 mg at 07/06/22 1008   aspirin tablet 325 mg  325 mg Oral Daily Caren Griffins, MD   325 mg at 07/06/22 1008   bisacodyl (DULCOLAX) suppository 10 mg  10 mg Rectal Daily PRN Emeterio Reeve, DO   10 mg at 07/03/22 7106   Chlorhexidine Gluconate Cloth 2 % PADS 6 each  6 each Topical Daily Wynetta Emery, Clanford L, MD   6 each at 07/05/22 1302   diclofenac Sodium (VOLTAREN) 1 % topical gel 2 g  2 g Topical QID PRN Little Ishikawa, MD       escitalopram (LEXAPRO) tablet 10 mg  10 mg Oral Daily Johnson, Clanford L, MD   10 mg at 07/06/22 1009   fentaNYL (SUBLIMAZE) injection 12.5 mcg  12.5 mcg Intravenous Q2H PRN Johnson, Clanford L, MD   12.5 mcg at 07/05/22 0003   fluticasone (FLONASE) 50 MCG/ACT nasal spray 2 spray  2 spray Each Nare Daily Johnson, Clanford L, MD   2 spray at 07/06/22 1016   influenza vac split quadrivalent PF (FLUARIX) injection 0.5 mL  0.5 mL Intramuscular Tomorrow-1000 Little Ishikawa, MD       lacosamide (VIMPAT) tablet 100 mg  100 mg Oral BID Emeterio Reeve, DO   100 mg at 07/06/22 1008   levETIRAcetam (KEPPRA) tablet 1,000 mg  1,000 mg Oral BID Emeterio Reeve, DO   1,000 mg at 07/06/22 1008   LORazepam (ATIVAN) injection 1 mg  1 mg Intravenous Q3H PRN Johnson, Clanford L, MD   1 mg at 06/18/22 2326   magnesium chloride (SLOW-MAG) 64 MG SR tablet 64 mg  1 tablet Oral Daily Emeterio Reeve, DO   64 mg at 07/06/22 1007   melatonin tablet 3 mg  3 mg Oral QHS Beulah Gandy A, NP   3 mg at 07/05/22 2153   melatonin tablet 5 mg  5 mg Oral STAT Hall, Carole N, DO       ondansetron (ZOFRAN) tablet 4 mg  4 mg Oral Q6H PRN Wynetta Emery,  Clanford L, MD       Or   ondansetron (ZOFRAN) injection 4 mg  4 mg Intravenous Q6H PRN Wynetta Emery, Clanford L, MD   4 mg at 07/05/22 9476   oxyCODONE (Oxy IR/ROXICODONE) immediate release tablet 5 mg  5 mg Oral Q4H PRN Wynetta Emery, Clanford L, MD   5 mg at 07/04/22 1423   pantoprazole  (PROTONIX) EC tablet 40 mg  40 mg Oral Daily Johnson, Clanford L, MD   40 mg at 07/06/22 1009   PHENobarbital (LUMINAL) tablet 32.4 mg  32.4 mg Oral Daily Pham, Minh Q, RPH-CPP   32.4 mg at 07/06/22 1008   polyvinyl alcohol (LIQUIFILM TEARS) 1.4 % ophthalmic solution 1 drop  1 drop Both Eyes Daily PRN Johnson, Clanford L, MD       pregabalin (LYRICA) capsule 200 mg  200 mg Oral BID Johnson, Clanford L, MD   200 mg at 07/06/22 1009   QUEtiapine (SEROQUEL) tablet 12.5 mg  12.5 mg Oral Daily Little Ishikawa, MD       rosuvastatin (CRESTOR) tablet 20 mg  20 mg Oral Daily Mansy, Jan A, MD   20 mg at 07/06/22 1009   sodium chloride flush (NS) 0.9 % injection 10-40 mL  10-40 mL Intracatheter Q12H Joanna Jack, MD   10 mL at 07/06/22 1016   sodium chloride flush (NS) 0.9 % injection 10-40 mL  10-40 mL Intracatheter PRN Joanna Jack, MD   10 mL at 06/28/22 1332   sodium phosphate (FLEET) 7-19 GM/118ML enema 1 enema  1 enema Rectal Daily PRN Emeterio Reeve, DO       traZODone (DESYREL) tablet 50 mg  50 mg Oral QHS Lang Snow, NP   50 mg at 07/05/22 2153    ALLERGIES:  No Known Allergies  PHYSICAL EXAM:  Performance status (ECOG): 1 - Symptomatic but completely ambulatory  There were no vitals filed for this visit.  Wt Readings from Last 3 Encounters:  07/06/22 243 lb 2.7 oz (110.3 kg)  06/08/22 237 lb (107.5 kg)  05/17/22 241 lb 4.8 oz (109.5 kg)   Physical Exam Vitals reviewed.  Constitutional:      Appearance: Normal appearance. She is obese.  Cardiovascular:     Rate and Rhythm: Normal rate and regular rhythm.     Pulses: Normal pulses.     Heart sounds: Normal heart sounds.  Pulmonary:     Effort: Pulmonary effort is normal.     Breath sounds: Normal breath sounds.  Neurological:     General: No focal deficit present.     Mental Status: She is alert and oriented to person, place, and time.  Psychiatric:        Mood and Affect: Mood normal.         Behavior: Behavior normal.     LABORATORY DATA:  I have reviewed the labs as listed.     Latest Ref Rng & Units 07/06/2022    6:20 AM 07/03/2022    8:47 AM 06/30/2022    2:43 AM  CBC  WBC 4.0 - 10.5 K/uL 5.2  5.6  4.3   Hemoglobin 12.0 - 15.0 g/dL 7.0  8.2  7.6   Hematocrit 36.0 - 46.0 % 20.4  25.1  23.1   Platelets 150 - 400 K/uL 47  58  50       Latest Ref Rng & Units 07/06/2022    6:20 AM 07/03/2022    8:47 AM 07/02/2022    4:55 AM  CMP  Glucose 70 - 99 mg/dL 138  118  109   BUN 6 - 20 mg/dL _0 Creatinine 0.44 - 1.00 mg/dL 0.71  0.57  0.53   Sodium 135 - 145 mmol/L 132  130  131   Potassium 3.5 - 5.1 mmol/L 3.2  4.1  3.9   Chloride 98 - 111 mmol/L 101  99  99   CO2 22 - 32 mmol/L _1 Calcium 8.9 - 10.3 mg/dL 7.7  8.0  7.7     DIAGNOSTIC IMAGING:  I have independently reviewed the scans and discussed with the patient. CT Angio Abd/Pel w/ and/or w/o  Result Date: 06/24/2022 CLINICAL DATA:  56 year old female with history of epigastric discomfort. History of multiple myeloma. * Tracking Code: BO * EXAM: CTA ABDOMEN AND PELVIS WITHOUT AND WITH CONTRAST TECHNIQUE: Multidetector CT imaging of the abdomen and pelvis was performed using the standard protocol during bolus administration of intravenous contrast. Multiplanar reconstructed images and MIPs were obtained and reviewed to evaluate the vascular anatomy. RADIATION DOSE REDUCTION: This exam was performed according to the departmental dose-optimization program which includes automated exposure control, adjustment of the mA and/or kV according to patient size and/or use of iterative reconstruction technique. CONTRAST:  65m OMNIPAQUE IOHEXOL 350 MG/ML SOLN COMPARISON:  CT of the abdomen and pelvis 04/30/2022. FINDINGS: Comment: Portions of today's examination are limited by considerable patient motion. VASCULAR Aorta: Atherosclerosis throughout the abdominal aorta. Normal caliber aorta without aneurysm,  dissection, vasculitis or significant stenosis. Celiac: Patent without evidence of aneurysm, dissection, vasculitis or significant stenosis. SMA: Patent without evidence of aneurysm, dissection, vasculitis or significant stenosis. Renals: Both renal arteries are patent without evidence of aneurysm, dissection, vasculitis, fibromuscular dysplasia or significant stenosis. IMA: Patent without evidence of aneurysm, dissection, vasculitis or significant stenosis. Inflow: Patent without evidence of aneurysm, dissection, vasculitis or significant stenosis. Proximal Outflow: Bilateral common femoral and visualized portions of the superficial and profunda femoral arteries are patent without evidence of aneurysm, dissection, vasculitis or significant stenosis. Veins: No obvious venous abnormality within the limitations of this arterial phase study. Review of the MIP images confirms the above findings. NON-VASCULAR Lower chest: Scattered areas of scarring are noted throughout the lung bases bilaterally. Hepatobiliary: Severe diffuse low attenuation throughout the hepatic parenchyma, indicative of a background of severe hepatic steatosis. There are multiple nodular appearing areas within the liver which appear spared from hepatic steatosis on precontrast images. Assessment for enhancement within these lesions is challenging on today's limited CT examination, and accordingly, these lesions are incompletely characterized, largest of which is in the right lobe of the liver (axial image 25 of series 11) measuring 1.7 cm in diameter. No intra or extrahepatic biliary ductal dilatation. Gallbladder is moderately distended. Gallbladder wall thickness is normal. No pericholecystic fluid or surrounding inflammatory changes. No calcified gallstones are noted in the lumen of the gallbladder. Pancreas: No pancreatic mass. No pancreatic ductal dilatation. No pancreatic or peripancreatic fluid collections or inflammatory changes. Spleen:  Unremarkable. Adrenals/Urinary Tract: 6 mm nonobstructive calculus in the right renal collecting system. No additional calculi are noted within the left renal collecting system, along the course of either ureter, or within the lumen of the urinary bladder. Bilateral kidneys and adrenal glands are normal in appearance. No hydroureteronephrosis. Urinary bladder is nearly decompressed around an indwelling Foley balloon catheter. Small amount of gas non dependently within the lumen of the urinary bladder is iatrogenic. Stomach/Bowel: The appearance of  the stomach is normal. No pathologic dilatation of small bowel or colon. Normal appendix. Lymphatic: No lymphadenopathy noted in the abdomen or pelvis. Reproductive: Status post hysterectomy.  Ovaries are atrophic. Other: No significant volume of ascites.  No pneumoperitoneum. Musculoskeletal: Areas of mixed lucency and sclerosis are noted throughout the visualized axial and appendicular skeleton, likely reflective of numerous osseous lesions from patient's known multiple myeloma. IMPRESSION: VASCULAR 1. No acute vascular abnormality in the abdomen or pelvis. 2. Aortic atherosclerosis. NON-VASCULAR 1. Severe hepatic steatosis. Multiple liver lesions poorly evaluated on today's CT examination, favored to represent areas of focal fatty sparing, however, underlying neoplasm is not excluded. Follow-up evaluation with nonemergent outpatient abdominal MRI with and without IV gadolinium is recommended in the near future to characterize these lesions and exclude neoplasm. 2. 6 mm nonobstructive calculus in the right renal collecting system. No ureteral stones or findings of urinary tract obstruction. 3. Multiple osseous lesions, presumably reflective of patient's known multiple myeloma. 4. Additional incidental findings, as above. These results will be called to the ordering clinician or representative by the Radiologist Assistant, and communication documented in the PACS or  [employer]. Electronically Signed   By: Vinnie Langton M.D.   On: 06/24/2022 07:48   Overnight EEG with video  Result Date: 06/20/2022 Joanna Jack, MD     06/21/2022  7:17 AM Patient Name: Joanna Reid Epilepsy Attending: Su Monks MD Referring Physician/Provider: Dr. Kerney Elbe Duration: 0300 on 06/19/22 to 1701 on 06/20/22  Patient history:  Joanna Reid is a 56 y.o. female with a history of seizure who is undergoing an EEG to evaluate for seizures.  Level of alertness: awake and asleep  Technical aspects: This EEG study was done with scalp electrodes positioned according to the 10-20 International system of electrode placement. Electrical activity was reviewed with band pass filter of 1-_0 , sensitivity of 7 uV/mm, display speed of 22m/sec with a _1  notched filter applied as appropriate. EEG data were recorded continuously and digitally stored.  Video monitoring was available and reviewed as appropriate.  Description: The best background was 4-5 Hz. This activity is reactive to stimulation. Drowsiness was manifested by background fragmentation; deeper stages of sleep were identified by K complexes and sleep spindles. There was focal slowing over the left hemisphere. Throughout the recording patient had continuous L sided lateralized periodic discharges at 0.5-1.0 Hz with no sustained evolution. There were no definitive electrographic seizures during this recording. Photic stimulation and hyperventilation were not performed. [facility] There was no change in background before, during, or after the event.   ABNORMALITY & CLINICAL CORRELATION - continuous L sided lateralized periodic discharges at 0.5-1.0 Hz with no sustained evolution therefore not meeting criteria for electrographic seizure. Thes pattern is however high on the ictal-interictal continuum with high risk for seizure. - moderate diffuse slowing indicative of global  cerebral dysfunction - left focal slowing indicative of focal cerebral dysfunction in that region Neurology will be notified of these findings. Recommend continuation of LTM. CSu Monks MD [facility] [phone] 7pm- 7am, please page neurology on call as listed in [place]    EEG adult  Result Date: 06/20/2022 SDerek Jack MD     06/20/2022  7:03 AM Routine EEG Report Joanna NICOLETTIis a 56y.o. female with a history of seizure who is undergoing an EEG to evaluate for seizures. Report: This EEG was acquired with electrodes placed according to the International 10-20 electrode  system (including Fp1, Fp2, F3, F4, C3, C4, P3, P4, O1, O2, T3, T4, T5, T6, A1, A2, Fz, Cz, Pz). The following electrodes were missing or displaced: none. The best background was 6 Hz with superimposed L focal slowing. This activity is reactive to stimulation. Drowsiness was manifested by background fragmentation; deeper stages of sleep were not identified. There were continuous L LPDs, at times 0.5-1 Hz, and at times accelerating to 2-3 Hz with subtle evolution concerning for possible seizure (2-3 times in this routine recording). Photic stimulation and hyperventilation were not performed. Impression and clinical correlation: This EEG was obtained while awake and drowsy and is abnormal due to: - moderate diffuse slowing indicative of global cerebral dysfunction - Continuous L lateralized periodic discharges, at times 0.5-1 Hz, and at times accelerating to 2-3 Hz with subtle evolution concerning for possible seizure. This pattern is on the ictal-interictal continuum with high risk for seizures. Prolonged EEG for further characterization is recommended. Joanna Monks, MD [facility] [phone] If 7pm- 7am, please page neurology on call as listed in [place].   ECHOCARDIOGRAM COMPLETE BUBBLE STUDY  Result Date: 06/19/2022    ECHOCARDIOGRAM REPORT   Patient Name:   Joanna Reid Date of Exam:  06/19/2022 Medical Rec #:  [id number]        Height:       64.0 in Accession #:    [id number]       Weight:       241.2 lb Date of Birth:  Jan 07, 1966        BSA:          2.118 m Patient Age:    25 years         BP:           161/92 mmHg Patient Gender: F                HR:           86 bpm. Exam Location:  [facility] Procedure: 2D Echo, Cardiac Doppler, Color Doppler and Saline Contrast Bubble            Study Indications:    Stroke  History:        Patient has prior history of Echocardiogram examinations, most                 recent 01/16/2022. Stroke; Risk Factors:Hypertension, Diabetes,                 Dyslipidemia and Former Smoker. Multiple myleoma, Port-a-cath in                 place.  Sonographer:    Wenda Low Referring Phys: [id number] JAN A MANSY  Sonographer Comments: Patient is obese. IMPRESSIONS  1. Left ventricular ejection fraction, by estimation, is 60 to 65%. The left ventricle has normal function. The left ventricle has no regional wall motion abnormalities. There is moderate asymmetric left ventricular hypertrophy of the septal segment. Left ventricular diastolic parameters are consistent with Grade I diastolic dysfunction (impaired relaxation).  2. Right ventricular systolic function is normal. The right ventricular size is normal. There is normal pulmonary artery systolic pressure. The estimated right ventricular systolic pressure is 93.8 mmHg.  3. The mitral valve is grossly normal. Trivial mitral valve regurgitation.  4. The aortic valve is tricuspid. Aortic valve regurgitation is mild. Aortic regurgitation PHT measures 603 msec.  5. Aortic dilatation noted. There is mild dilatation of the ascending aorta, measuring 41 mm.  6. The inferior  vena cava is normal in size with <50% respiratory variability, suggesting right atrial pressure of 8 mmHg.  7. Agitated saline contrast bubble study was negative, with no evidence of right to left interatrial shunt. Comparison(s): Prior images reviewed  side by side. LVEF remains normal range at 60-65%. Mildly dilated ascending aorta with mild aortic regurgitation. FINDINGS  Left Ventricle: Left ventricular ejection fraction, by estimation, is 60 to 65%. The left ventricle has normal function. The left ventricle has no regional wall motion abnormalities. The left ventricular internal cavity size was normal in size. There is  moderate asymmetric left ventricular hypertrophy of the septal segment. Left ventricular diastolic parameters are consistent with Grade I diastolic dysfunction (impaired relaxation). Right Ventricle: The right ventricular size is normal. No increase in right ventricular wall thickness. Right ventricular systolic function is normal. There is normal pulmonary artery systolic pressure. The tricuspid regurgitant velocity is 2.20 m/s, and  with an assumed right atrial pressure of 8 mmHg, the estimated right ventricular systolic pressure is 50.3 mmHg. Left Atrium: Left atrial size was normal in size. Right Atrium: Right atrial size was normal in size. Pericardium: There is no evidence of pericardial effusion. Mitral Valve: The mitral valve is grossly normal. Trivial mitral valve regurgitation. MV peak gradient, 4.1 mmHg. The mean mitral valve gradient is 2.0 mmHg. Tricuspid Valve: The tricuspid valve is grossly normal. Tricuspid valve regurgitation is trivial. Aortic Valve: The aortic valve is tricuspid. Aortic valve regurgitation is mild. Aortic regurgitation PHT measures 603 msec. Aortic valve mean gradient measures 5.0 mmHg. Aortic valve peak gradient measures 12.4 mmHg. Aortic valve area, by VTI measures 2.95 cm. Pulmonic Valve: The pulmonic valve was grossly normal. Pulmonic valve regurgitation is trivial. Aorta: The aortic root is normal in size and structure and aortic dilatation noted. There is mild dilatation of the ascending aorta, measuring 41 mm. Venous: The inferior vena cava is normal in size with less than 50% respiratory variability,  suggesting right atrial pressure of 8 mmHg. IAS/Shunts: No atrial level shunt detected by color flow Doppler. Agitated saline contrast was given intravenously to evaluate for intracardiac shunting. Agitated saline contrast bubble study was negative, with no evidence of any interatrial shunt.  LEFT VENTRICLE PLAX 2D LVIDd:         5.20 cm   Diastology LVIDs:         3.30 cm   LV e' medial:    5.22 cm/s LV PW:         1.30 cm   LV E/e' medial:  15.2 LV IVS:        1.50 cm   LV e' lateral:   7.62 cm/s LVOT diam:     2.10 cm   LV E/e' lateral: 10.4 LV SV:         93 LV SV Index:   44 LVOT Area:     3.46 cm  RIGHT VENTRICLE RV Basal diam:  2.70 cm RV Mid diam:    2.20 cm RV S prime:     14.80 cm/s LEFT ATRIUM             Index        RIGHT ATRIUM           Index LA diam:        4.30 cm 2.03 cm/m   RA Area:     13.20 cm LA Vol (A2C):   62.0 ml 29.27 ml/m  RA Volume:   24.40 ml  11.52 ml/m LA Vol (A4C):  46.4 ml 21.90 ml/m LA Biplane Vol: 54.2 ml 25.59 ml/m  AORTIC VALVE                    PULMONIC VALVE AV Area (Vmax):    2.81 cm     PV Vmax:       1.29 m/s AV Area (Vmean):   3.00 cm     PV Peak grad:  6.7 mmHg AV Area (VTI):     2.95 cm AV Vmax:           176.00 cm/s AV Vmean:          95.700 cm/s AV VTI:            0.315 m AV Peak Grad:      12.4 mmHg AV Mean Grad:      5.0 mmHg LVOT Vmax:         143.00 cm/s LVOT Vmean:        83.000 cm/s LVOT VTI:          0.268 m LVOT/AV VTI ratio: 0.85 AI PHT:            603 msec  AORTA Ao Root diam: 3.70 cm Ao Asc diam:  4.10 cm MITRAL VALVE                TRICUSPID VALVE MV Area (PHT): 5.06 cm     TR Peak grad:   19.4 mmHg MV Area VTI:   3.14 cm     TR Vmax:        220.00 cm/s MV Peak grad:  4.1 mmHg MV Mean grad:  2.0 mmHg     SHUNTS MV Vmax:       1.01 m/s     Systemic VTI:  0.27 m MV Vmean:      69.0 cm/s    Systemic Diam: 2.10 cm MV Decel Time: 150 msec MV E velocity: 79.50 cm/s MV A velocity: 114.00 cm/s MV E/A ratio:  0.70 Rozann Lesches MD Electronically  signed by Rozann Lesches MD Signature Date/Time: 06/19/2022/10:34:16 AM    Final    MR BRAIN W WO CONTRAST  Result Date: 06/18/2022 CLINICAL DATA:  Provided history: Neuro deficit, acute, stroke suspected. Mental status change, unknown cause. EXAM: MRI HEAD WITHOUT AND WITH CONTRAST MRA HEAD WITHOUT CONTRAST TECHNIQUE: Multiplanar, multi-echo pulse sequences of the brain and surrounding structures were acquired without and with intravenous contrast. Angiographic images of the Circle of Willis were acquired using MRA technique without intravenous contrast. CONTRAST:  41m GADAVIST GADOBUTROL 1 MMOL/ML IV SOLN COMPARISON:  Brain MRI 06/12/2022. CT angiogram head/neck 12/26/2019. FINDINGS: MRI HEAD FINDINGS Intermittently motion degraded examination, limiting evaluation. Most notably, the axial T2 FLAIR sequence is moderately motion degraded, the coronal T2 and T2 FLAIR sequences through the hippocampi are mild to moderately motion degraded and the sagittal T1 weighted postcontrast sequence is moderately motion degraded. Brain: No age advanced or lobar predominant parenchymal atrophy. Redemonstrated moderate-sized chronic cortical/subcortical infarct within the left parietal lobe (posterior left MCA territory and left MCA/PCA watershed territory). Chronic hemosiderin deposition within portions of this infarct territory. New from the prior brain MRI of 06/12/2022, there is cortical diffusion-weighted signal abnormality along the margins of this chronic infarct (within the right parietal lobe, at the left temporoparietal junction and within the lateral left occipital lobe) (for instance as seen on series 9, images 17 and 23). Subtle diffusion weighted signal abnormality is also suspected within the posteromedial left thalamus (series 9, image 17).  Findings are suspicious for seizure related changes, although acute infarcts are also a consideration. Redemonstrated tiny foci of diffusion-weighted signal abnormality  and T2 FLAIR hyperintense signal abnormality measuring up to 3 mm within the anterior right corona radiata (series 11, image 17) and left occipital lobe white matter (series 9, image 16). These were present on the prior brain MRI of 06/12/2022, and likely reflect small subacute infarcts (right MCA territory and left PCA territory, respectively). New 2 mm acute white matter infarct immediately lateral to the right caudate nucleus (series 9, image 19). Background multifocal T2 FLAIR hyperintense signal abnormality within the cerebral white matter (mild) and within the pons (moderate). Unchanged small chronic infarcts within the bilateral cerebellar hemispheres. No evidence of an intracranial mass. No extra-axial fluid collection. No midline shift. No definite pathologic intracranial enhancement. Vascular: Maintained flow voids within the proximal large arterial vessels. Skull and upper cervical spine: Heterogeneous bone marrow signal with numerous foci of osseous enhancement. These findings are compatible with widespread multiple myeloma. Incompletely assessed cervical spondylosis. Sinuses/Orbits: No mass or acute finding within the imaged orbits. No significant paranasal sinus disease. Other: Trace fluid within the bilateral mastoid air cells. MRA HEAD FINDINGS Mildly motion degraded exam. Anterior circulation: The intracranial internal carotid arteries are patent. Mild atherosclerotic irregularity of both vessels. The M1 middle cerebral arteries are patent. Atherosclerotic irregularity of the M2 and more distal MCA vessels, bilaterally. No M2 proximal branch occlusion or high-grade proximal stenosis. The anterior cerebral arteries are patent. Atherosclerotic irregularity of both vessels. Most notably, there is an apparent moderate/severe stenosis within the proximal right ACA A2 segment, which is new from the prior CTA of 12/26/2019. Posterior circulation: The intracranial vertebral arteries are patent. The basilar  artery is patent. Mild atherosclerotic irregularity of the basilar artery. The posterior cerebral arteries are patent. Atherosclerotic irregularity of both vessels. Most notably, there are sites of up to moderate stenosis within left PCA branches at P2/P3 junction, progressed (series 1033, image 2) and a progressive moderate stenosis within a right PCA branch at the P2/P3 junction. A left posterior communicating artery is present. The right posterior communicating artery is diminutive or absent Anatomic variants: As described. MRI brain impression #2 and #3 will be called to the ordering clinician or representative by the Radiologist Assistant, and communication documented in the PACS or [employer]. IMPRESSION: MRI brain: 1. Intermittently motion degraded exam. 2. Foci of cortical diffusion-weighted signal abnormality along the margins of a chronic left parietal lobe infarct (within the left parietal lobe, at the left temporoparietal junction and within the lateral left occipital lobe). Subtle diffusion-weighted signal abnormality is also suspected within the posteromedial left thalamus. These findings are suspicious for seizure-related changes. However, alternatively, these may reflect acute infarcts. 3. New 2 mm acute white matter infarct immediately lateral to the right caudate head. 4. Redemonstrated tiny subacute infarcts within the anterior right corona radiata and left occipital lobe white matter (right MCA and left PCA vascular territories, respectively). 5. Background chronic small vessel ischemic changes, as described and unchanged from the recent prior brain MRI of 06/12/2022. 6. Redemonstrated small chronic infarcts within both cerebellar hemispheres. 7. Widespread multiple myeloma. MRA head: 1. No intracranial large vessel occlusion is identified. 2. Intracranial atherosclerotic disease with multifocal stenoses, most notably as follows. 3. Moderate/severe stenosis within the right ACA proximal A2  segment, new from the prior CTA head/neck of 12/26/2019. 4. Sites of up to moderate stenosis within left PCA branches at the P2/P3 junction, progressed. 5. Moderate stenosis  within a right PCA branch at the P2/P3 junction, progressed. Electronically Signed   By: Kellie Simmering D.O.   On: 06/18/2022 19:39   MR ANGIO HEAD WO CONTRAST  Result Date: 06/18/2022 CLINICAL DATA:  Provided history: Neuro deficit, acute, stroke suspected. Mental status change, unknown cause. EXAM: MRI HEAD WITHOUT AND WITH CONTRAST MRA HEAD WITHOUT CONTRAST TECHNIQUE: Multiplanar, multi-echo pulse sequences of the brain and surrounding structures were acquired without and with intravenous contrast. Angiographic images of the Circle of Willis were acquired using MRA technique without intravenous contrast. CONTRAST:  36m GADAVIST GADOBUTROL 1 MMOL/ML IV SOLN COMPARISON:  Brain MRI 06/12/2022. CT angiogram head/neck 12/26/2019. FINDINGS: MRI HEAD FINDINGS Intermittently motion degraded examination, limiting evaluation. Most notably, the axial T2 FLAIR sequence is moderately motion degraded, the coronal T2 and T2 FLAIR sequences through the hippocampi are mild to moderately motion degraded and the sagittal T1 weighted postcontrast sequence is moderately motion degraded. Brain: No age advanced or lobar predominant parenchymal atrophy. Redemonstrated moderate-sized chronic cortical/subcortical infarct within the left parietal lobe (posterior left MCA territory and left MCA/PCA watershed territory). Chronic hemosiderin deposition within portions of this infarct territory. New from the prior brain MRI of 06/12/2022, there is cortical diffusion-weighted signal abnormality along the margins of this chronic infarct (within the right parietal lobe, at the left temporoparietal junction and within the lateral left occipital lobe) (for instance as seen on series 9, images 17 and 23). Subtle diffusion weighted signal abnormality is also suspected within  the posteromedial left thalamus (series 9, image 17). Findings are suspicious for seizure related changes, although acute infarcts are also a consideration. Redemonstrated tiny foci of diffusion-weighted signal abnormality and T2 FLAIR hyperintense signal abnormality measuring up to 3 mm within the anterior right corona radiata (series 11, image 17) and left occipital lobe white matter (series 9, image 16). These were present on the prior brain MRI of 06/12/2022, and likely reflect small subacute infarcts (right MCA territory and left PCA territory, respectively). New 2 mm acute white matter infarct immediately lateral to the right caudate nucleus (series 9, image 19). Background multifocal T2 FLAIR hyperintense signal abnormality within the cerebral white matter (mild) and within the pons (moderate). Unchanged small chronic infarcts within the bilateral cerebellar hemispheres. No evidence of an intracranial mass. No extra-axial fluid collection. No midline shift. No definite pathologic intracranial enhancement. Vascular: Maintained flow voids within the proximal large arterial vessels. Skull and upper cervical spine: Heterogeneous bone marrow signal with numerous foci of osseous enhancement. These findings are compatible with widespread multiple myeloma. Incompletely assessed cervical spondylosis. Sinuses/Orbits: No mass or acute finding within the imaged orbits. No significant paranasal sinus disease. Other: Trace fluid within the bilateral mastoid air cells. MRA HEAD FINDINGS Mildly motion degraded exam. Anterior circulation: The intracranial internal carotid arteries are patent. Mild atherosclerotic irregularity of both vessels. The M1 middle cerebral arteries are patent. Atherosclerotic irregularity of the M2 and more distal MCA vessels, bilaterally. No M2 proximal branch occlusion or high-grade proximal stenosis. The anterior cerebral arteries are patent. Atherosclerotic irregularity of both vessels. Most  notably, there is an apparent moderate/severe stenosis within the proximal right ACA A2 segment, which is new from the prior CTA of 12/26/2019. Posterior circulation: The intracranial vertebral arteries are patent. The basilar artery is patent. Mild atherosclerotic irregularity of the basilar artery. The posterior cerebral arteries are patent. Atherosclerotic irregularity of both vessels. Most notably, there are sites of up to moderate stenosis within left PCA branches at P2/P3 junction, progressed (series 1033,  image 2) and a progressive moderate stenosis within a right PCA branch at the P2/P3 junction. A left posterior communicating artery is present. The right posterior communicating artery is diminutive or absent Anatomic variants: As described. MRI brain impression #2 and #3 will be called to the ordering clinician or representative by the Radiologist Assistant, and communication documented in the PACS or [employer]. IMPRESSION: MRI brain: 1. Intermittently motion degraded exam. 2. Foci of cortical diffusion-weighted signal abnormality along the margins of a chronic left parietal lobe infarct (within the left parietal lobe, at the left temporoparietal junction and within the lateral left occipital lobe). Subtle diffusion-weighted signal abnormality is also suspected within the posteromedial left thalamus. These findings are suspicious for seizure-related changes. However, alternatively, these may reflect acute infarcts. 3. New 2 mm acute white matter infarct immediately lateral to the right caudate head. 4. Redemonstrated tiny subacute infarcts within the anterior right corona radiata and left occipital lobe white matter (right MCA and left PCA vascular territories, respectively). 5. Background chronic small vessel ischemic changes, as described and unchanged from the recent prior brain MRI of 06/12/2022. 6. Redemonstrated small chronic infarcts within both cerebellar hemispheres. 7. Widespread multiple  myeloma. MRA head: 1. No intracranial large vessel occlusion is identified. 2. Intracranial atherosclerotic disease with multifocal stenoses, most notably as follows. 3. Moderate/severe stenosis within the right ACA proximal A2 segment, new from the prior CTA head/neck of 12/26/2019. 4. Sites of up to moderate stenosis within left PCA branches at the P2/P3 junction, progressed. 5. Moderate stenosis within a right PCA branch at the P2/P3 junction, progressed. Electronically Signed   By: Kellie Simmering D.O.   On: 06/18/2022 19:39   DG Chest Port 1 View  Result Date: 06/18/2022 CLINICAL DATA:  Possible sepsis EXAM: PORTABLE CHEST 1 VIEW COMPARISON:  06/08/22 CXR FINDINGS: Right chest part with the tip at the cavoatrial junction. Unchanged cardiac and mediastinal contours. No pleural effusion. No pneumothorax. Bibasilar atelectasis. Visualized upper abdomen is unremarkable.No displaced rib fractures. IMPRESSION: Bibasilar atelectasis. Electronically Signed   By: Marin Roberts M.D.   On: 06/18/2022 12:12   MR Brain W Wo Contrast  Result Date: 06/12/2022 CLINICAL DATA:  56 year old female with multiple myeloma. Headache. Weakness. EXAM: MRI HEAD WITHOUT AND WITH CONTRAST TECHNIQUE: Multiplanar, multiecho pulse sequences of the brain and surrounding structures were obtained without and with intravenous contrast. CONTRAST:  80m GADAVIST GADOBUTROL 1 MMOL/ML IV SOLN COMPARISON:  Brain MRI 12/27/2019.  Cervical spine MRI 03/30/2022. FINDINGS: Brain: Chronic posterior left MCA infarct which was acute on the previous MRI. Encephalomalacia and gliosis there with mild associated hemosiderin. Occasional other small, subtle areas of cortical encephalomalacia (right superior frontal gyrus series 12, image 29) some of which are also related to ischemia seen on the prior exam. Small but numerous bilateral chronic cerebellar infarcts have progressed since 2021 on series 10, image 42. But a punctate area of abnormal diffusion in  the left occipital lobe white matter on series 5, image 16 appears mildly restricted on ADC. And there is a more subtle similar finding in the right anterior corona radiata on series 5, image 21. These are in areas of white matter T2/FLAIR hyperintensity. No other restricted diffusion. No midline shift, mass effect, evidence of mass lesion, ventriculomegaly, extra-axial collection or acute intracranial hemorrhage. Cervicomedullary junction and pituitary are within normal limits. Mild to moderate T2 and FLAIR heterogeneity in the pons appears increased since 2021. No abnormal enhancement of the brain.  No dural thickening. Vascular: Major intracranial  vascular flow voids are stable since 2021. Major dural venous sinuses are enhancing and appear to be patent. Skull and upper cervical spine: Heterogeneous bone marrow signal compatible with widespread multiple myeloma. No other osseous abnormality identified. Sinuses/Orbits: Rightward gaze, otherwise negative orbits soft tissues. Paranasal sinuses are stable and well aerated. Other: Mild chronic left mastoid effusion is stable since 2021. Other visible internal auditory structures appear normal. Negative visible scalp and face. IMPRESSION: 1. Difficult to exclude punctate acute/subacute white matter infarcts in both the Right MCA and Left PCA vascular territories. No associated hemorrhage or mass effect. 2. Underlying chronic brain ischemia with no other acute intracranial abnormality. 3. Widespread Multiple Myeloma. Electronically Signed   By: Genevie Ann M.D.   On: 06/12/2022 12:25   DG Chest Port 1 View  Result Date: 06/08/2022 CLINICAL DATA:  Shortness of breath and cough x2 days. EXAM: PORTABLE CHEST 1 VIEW COMPARISON:  April 12, 2022 FINDINGS: There is stable right-sided venous Port-A-Cath positioning. The heart size and mediastinal contours are within normal limits. Low lung volumes are noted. There is no evidence of an acute infiltrate, pleural effusion or  pneumothorax. Multilevel degenerative changes seen throughout the thoracic spine. IMPRESSION: Low lung volumes without evidence of acute or active cardiopulmonary disease. Electronically Signed   By: Virgina Norfolk M.D.   On: 06/08/2022 10:59     ASSESSMENT:  1.  IgG lambda multiple myeloma, stage III, del 17 p: -4 cycles of RVD from 11/01/2017 through 01/14/2018. -Stem cell transplant on 02/20/2018 at [facility]. -Maintenance Velcade every 2 weeks and Revlimid 10 mg 3 weeks on/1 week off started on 07/29/2018. -Myeloma panel from 12/08/2018 shows M spike not observed.  Kappa light chains of 32.1 with ratio of 1.64.  Immunofixation was negative. 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  MRI of the brain on 12/27/2019 shows acute ischemic nonhemorrhagic left MCA territory infarct involving left parietal lobe, corresponding with perfusion deficit on CT scan angiogram.  No associated hemorrhage or mass-effect.  Additional few scattered punctate acute ischemic nonhemorrhagic infarcts involving bilateral frontal and parietal lobes as well as left cerebellum. - Revlimid was on hold since June 2021 due to potential for contributing to CVA. - Velcade dose was reduced to 1 mg per metered square on 12/28/2020 due to worsening neuropathy in the feet.,  Pomalidomide 2 mg 3 weeks on/1 week off started around 09/20/2021, discontinued on 01/17/2022 due to progression. - Daratumumab, carfilzomib and dexamethasone (DKd) started on 01/25/2022, cycle 3 on 04/05/2022 progression after 3 cycles. - I have contacted Dr. Laverta [place] at [facility] for BiTE/CAR-T therapies. - Selinexor 80 mg day 1, 3 weekly with dexamethasone 20 mg twice weekly started on 05/12/2022   2.  CVA with aphasia: -We held her myeloma treatments since CVA with aphasia on 12/26/2019. -Her aphasia is improving.   PLAN:  1.  IgG lambda multiple myeloma, stage III, del 17 p: - Selinexor and dexamethasone started on 05/12/2022. - Last dose was on 06/04/2022. - Reviewed  labs today which showed platelet count 11, hemoglobin 7.3.  White count is 3 with normal ANC. - Recommend transfusing 1 unit platelets and 1 unit PRBC. - Her husband reports that she is confused.  Will order MRI of the brain with and without contrast ASAP.   2.  Hypomagnesemia: - She is taking magnesium 3 times daily.  Magnesium today is 1.4.  She will receive IV magnesium 4 g.  Potassium is also low at 3.1.  She will receive 40 mEq  p.o.   3.  Bone strengthening agents: - Continue Zometa every 4 weeks.   4.  Peripheral neuropathy: - Continue Lyrica 200 mg twice daily.   5.  ID prophylaxis: - Continue acyclovir twice daily.   6.  Intractable nausea/vomiting - Continue Reglan 10 mg every 6-8 hours as needed.   7.  Recurrent UTIs: - Continue Macrobid daily for prophylaxis.   Orders placed this encounter:  Orders Placed This Encounter  Procedures   MR Brain W Wo Contrast   Comprehensive metabolic panel   Magnesium   Lactate dehydrogenase   CBC with Differential/Platelet   Basic metabolic panel   Magnesium       Joanna Jack, MD [place] [phone]

## 2022-07-06 NOTE — Progress Notes (Signed)
LTM EEG hooked up and running - no initial skin breakdown - push button tested - [facility] monitoring.  

## 2022-07-06 NOTE — Progress Notes (Signed)
Speech Language Pathology Treatment: Dysphagia  Patient Details Name: Joanna Reid MRN: [id number] DOB: [birth year] Today's Date: 07/06/2022 Time: 4128-7867 SLP Time Calculation (min) (ACUTE ONLY): 15 min  Assessment / Plan / Recommendation Clinical Impression  Pt seen for f/u dysphagia tx with min verbal cueing required for attempting food/liquids d/t pt refusal initially.  Pt with decreased sustained attention and confusion impacting bolus manipulation, but adequate, slow mastication observed with soft consistency.  Thin via straw (with oral swishing x1) consumed with successive swallows during this trial also with neither consistency eliciting overt s/sx of aspiration.  Discussed with family member Joanna Reid) allowing pt to consume preferred foods (not Dysphagia 2) if mentation improved and pt is alert, but Dysphagia 2/thin liquid diet continue for now as primary diet consistency d/t pt's mentation waxing/waning increasing aspiration risk.  Continue ST for diet progression/dysphagia tx in acute setting.   HPI HPI: Joanna Reid is a 56 year old female with refractory multiple myeloma status post relapse, hypertension, gout, GERD, type 2 diabetes mellitus, migraine headaches, stroke with residual aphasia presented to ED on 06/19/22 due to ongoing weakness and lethargy. She reportedly last had chemotherapy 2 weeks ago. She has recently received PRBC and platelets in the cancer center. Her work up revealed a K of 2.5, Hg was 7.7, WBC 2.6, platelets 15. She noted no spontaneous bleeding. While I was evaluating patient in the ED she was noted to become acutely somnolent and then eyes rolled in back of head and she had a full tonic clonic seizure lasting about 2 to 3 mins; transferred to [facility] onRobin R Reid is a 56 year old female with refractory multiple myeloma status post relapse, hypertension, gout, GERD, type 2 diabetes mellitus, migraine headaches, stroke with residual aphasia presented to ED  today due to ongoing weakness and lethargy. She reportedly last had chemotherapy 2 weeks ago. She has recently received PRBC and platelets in the cancer center. Her work up revealed a K of 2.5, Hg was 7.7, WBC 2.6, platelets 15. She noted no spontaneous bleeding. While I was evaluating patient in the ED she was noted to become acutely somnolent and then eyes rolled in back of head and she had a full tonic clonic seizure lasting about 2 to 3 mins. I called for emergency lorazepam 1 mg IV and after that was given it subsided and she was in a post-ictal state. She bit her tongue and there was some bleeding from mouth. MRI on admission showed a 2 mm acute white matter infarct lateral to the right caudate head, and also tiny subacute infarcts in the anterior right corona radiata and left occipital lobe white matter. Pt transferred to [facility] from [place] has been following for dysphagia tx/management.      SLP Plan  Continue with current plan of care      Recommendations for follow up therapy are one component of a multi-disciplinary discharge planning process, led by the attending physician.  Recommendations may be updated based on patient status, additional functional criteria and insurance authorization.    Recommendations  Diet recommendations: Dysphagia 2 (fine chop);Thin liquid Liquids provided via: Straw Medication Administration: Whole meds with liquid Supervision: Intermittent supervision to cue for compensatory strategies;Staff to assist with self feeding Compensations: Slow rate;Small sips/bites Postural Changes and/or Swallow Maneuvers: Seated upright 90 degrees                Oral Care Recommendations: Oral care BID;Staff/trained caregiver to provide oral care Follow Up Recommendations:  Follow physician's recommendations for discharge plan and follow up therapies Assistance recommended at discharge: Frequent or constant Supervision/Assistance SLP  Visit Diagnosis: Dysphagia, unspecified (R13.10) Plan: Continue with current plan of care           Elvina Sidle, M.S., CCC-SLP  07/06/2022, 11:57 AM

## 2022-07-06 NOTE — Progress Notes (Signed)
Subjective: NAEO.   ROS: negative except above  Examination  Vital signs in last 24 hours: Temp:  [97.6 F (36.4 C)-98.9 F (37.2 C)] 97.6 F (36.4 C) (12/29 1147) Pulse Rate:  [84-106] 98 (12/29 1147) Resp:  [15-20] 18 (12/29 1147) BP: (89-162)/(48-83) 89/57 (12/29 1147) SpO2:  [93 %-100 %] 93 % (12/29 1147) Weight:  [110.3 kg] 110.3 kg (12/29 0600)  General: lying in bed, NAD Neuro: opens eyes to verbal and repeated tactile stimulation, able to tell me her name but didn't follow commands, moving all extremities spontaneously  Basic Metabolic Panel: Recent Labs  Lab 06/30/22 0243 07/01/22 0320 07/02/22 0455 07/03/22 0847 07/06/22 0620  NA 133* 133* 131* 130* 132*  K 3.5 3.6 3.9 4.1 3.2*  CL 100 101 99 99 101  CO2 _0 GLUCOSE 119* 107* 109* 118* 138*  BUN _1 CREATININE 0.45 0.57 0.53 0.57 0.71  CALCIUM 7.6* 7.6* 7.7* 8.0* 7.7*  MG 1.4* 1.6* 1.5*  --   --     CBC: Recent Labs  Lab 06/30/22 0243 07/03/22 0847 07/06/22 0620  WBC 4.3 5.6 5.2  HGB 7.6* 8.2* 7.0*  HCT 23.1* 25.1* 20.4*  MCV 90.6 91.9 90.7  PLT 50* 58* 47*     Coagulation Studies: No results for input(s): "LABPROT", "INR" in the last 72 hours.  Imaging No new brain imaging overnight   ASSESSMENT AND PLAN: 56 year old female with history of multiple medical myeloma,thrombocytopenia, left hemispheric stroke with resultant dysphagia and right-sided weakness which is gradually improving who presented with seizures.   Epilepsy Chronic stroke Multiple myeloma Acute encephalopathy, improving Pancytopenia -Epilepsy likely due to underlying stroke -Encephalopathy could be due to postictal state, medications.   Recommendations -Continue Keppra 1044m twice daily,  Vimpat 100 mg twice daily, pregabalin 200 mg twice daily and phenobarb taper - Seroquel moved to 2100  -Still confused, therefore will repeat overnight EEG. If any seizues, can increase phenobarb - Also keppra  might contribute to agitation. If no seizures, can switch keppra to [employer]- Rescue meds: intranasal valtoco 129min each nostril for clinical seizure lasting over 5 minutes. If not approved by insurance, can prescribe Clonazepam ODT [id number]  clinical seizure lasting over 5 minutes -Continue seizure precautions -Continue PT/OT, speech therapy -Management of rest of comorbidities per primary team - F/u with neuro in 3 months. May consider repeat MRI brain at that time if still has language deficits    I have spent a total of 35 minutes with the patient reviewing hospital notes,  test results, labs and examining the patient as well as establishing an assessment and plan.  > 50% of time was spent in direct patient care.   PriZeb Comfortilepsy [facility] For questions after 5pm please refer to AMION to reach the Neurologist on call

## 2022-07-07 DIAGNOSIS — R569 Unspecified convulsions: Secondary | ICD-10-CM | POA: Diagnosis not present

## 2022-07-07 DIAGNOSIS — R4182 Altered mental status, unspecified: Secondary | ICD-10-CM | POA: Diagnosis not present

## 2022-07-07 MED ORDER — QUETIAPINE FUMARATE 25 MG PO TABS
25.0000 mg | ORAL_TABLET | Freq: Every day | ORAL | Status: DC
  Administered 2022-07-07 – 2022-07-10 (×4): 25 mg via ORAL
  Filled 2022-07-07 (×4): qty 1

## 2022-07-07 NOTE — Procedures (Signed)
Patient Name: Joanna Reid  MRN: [id number]  Epilepsy Attending: [name]  Referring Physician/Provider: [name], MD Duration: 07/06/2022 1545 to 07/07/2022 4496   Patient history:  56 y.o. female with a history of seizure who is undergoing an EEG to evaluate for seizures.    Level of alertness: Awake, sleep   AEDs during EEG study: Phenobarb, PGB, LEV, LCM   Technical aspects: This EEG study was done with scalp electrodes positioned according to the 10-20 International system of electrode placement. Electrical activity was reviewed with band pass filter of 1-'70Hz'$ , sensitivity of 7 uV/mm, display speed of 32m/sec with a '60Hz'$  notched filter applied as appropriate. EEG data were recorded continuously and digitally stored.  Video monitoring was available and reviewed as appropriate.   Description: No clear posterior dominant rhythm was seen. Sleep was characterized by sleep spindles (12 to 14 Hz), maximal frontocentral region. EEG showed continuous generalized 3 to 6 Hz theta-delta slowing. Hyperventilation and photic stimulation were not performed.     ABNORMALITY - Continuous slow, generalized   IMPRESSION: This study is suggestive of moderate to severe diffuse encephalopathy, nonspecific etiology. No seizures  were seen throughout the recording.    [name]

## 2022-07-07 NOTE — Progress Notes (Signed)
PROGRESS NOTE    Joanna Reid   GBE:[id number] DOB: [birth year]  DOA: 06/18/2022 Date of Service: 07/07/22 PCP: Lindell Spar, MD  Brief Narrative / Interim history: 56 year old female with refractory multiple myeloma status post relapse, hypertension, gout, GERD, type 2 diabetes mellitus, migraine headaches, stroke with residual aphasia presented to ED for ongoing weakness and lethargy. Last had chemotherapy 2 weeks ago. She has recently received PRBC and platelets in the cancer center. While in the ED patient had acute mental status change, unarousable with full tonic-clonic seizure lasting 2 to 3 minutes which improved with lorazepam. Transferred to [facility] for further neuro evaluation and follow-up as well as continuous EEG.  She has been placed on multiple antiepileptics, now some AEDs have been discontinued or are being tapered down as below under guidance from neurology. Transitioned to oral phenobarbital, Vimpat, Keppra as of 12/23. Mental status continues to improve slowly - complicated by poor sleep cycle and likely concurrent hospital delirium.    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   EEG was concerning for possible seizures.   Workup with a 2D echo showed normal EF 65 to 75%, grade 1 diastolic dysfunction, bubble study was negative, A1c 7.2.  converted convert IV antiepileptics to p.o. -->  Transitioned to oral phenobarbital, Vimpat, Keppra as of 12/23.  Continue aspirin and statin for stroke.     Will need rehab SNF Outpatient plan: F/u with neuro in 3 months. May consider repeat MRI brain at that time if still has language deficits   Pt remains fairly somnolent/confused, neurology reassessing - increase seroquel dose  Per Neuro - may need to transition from [place] to [facility] or repeat EEG pending mental status improvements as sleep improves.  Abdominal pain  Secondary to nephrolithiasis(nonobstructive) Complicated by constipation, resolved Incidental liver lesions noted  Patient indicates her previous flank pain is consistent with her history of nephrolithiasis, CTA notable for nonobstructing nephrolithasis -per husband these are somewhat chronic.   Abdominal imaging incidentally showed multiple liver lesions, which will require an MRI as an outpatient.   Hypomagnesemia, hypokalemia  Continue to monitor PRN    Pancytopenia  Secondary to myeloma/treatment  Plt count improving, Hgb downtrending slowly Repeat labs in am Received a total of 3 units of packed red blood cells, last one 12/20.    Refractory Multiple Myeloma  Followed by Dr. Delton Coombes.  Last chemo given 2 weeks prior to admit  DVT prophylaxis: SCD Pertinent IV fluids/nutrition: dysphagia 2 diet per SLP Central lines / invasive devices: R chest port   Code  Status: FULL CODE   Disposition: inpatient  TOC needs: SNF placement Barriers to discharge / significant pending items: placement, mentation improvement, neurology clearance for discharge, may be able to go next few days    Subjective:  No acute issues or events overnight, patient continues to have short intervals of sleep overnight but does not sleep through the night still, more awake alert and oriented today able to finish complete sentences for me in regards to her disposition and care although still somewhat sluggish speech.  Family Communication: Husband at bedside, clearly agitated and frustrated; he voiced he would refuse a discharge to facility and he would take the patient home once she was cleared for discharge as he is "unimpressed with his options".  Objective Findings:  Vitals:   07/06/22 1950 07/06/22 2054 07/06/22 2356 07/07/22 0315  BP: (!) 178/75 (!) 164/74 136/67 (!) 143/75  Pulse: 98 94 (!) 109 (!) 107  Resp: _0 Temp: 97.8 F (36.6 C)  98.4 F (36.9 C) 98.5 F (36.9 C)  TempSrc: Oral  Axillary Oral  SpO2: 99%  98% 100%  Weight:      Height:        Intake/Output Summary (Last 24 hours) at 07/07/2022 0828 Last data filed at 07/06/2022 0900 Gross per 24 hour  Intake 240 ml  Output --  Net 240 ml    Filed Weights   06/18/22 1728 06/19/22 0500 07/06/22 0600  Weight: 109.3 kg 109.4 kg 110.3 kg    Examination:   General:  Pleasantly resting in bed, No acute distress.  Somnolent but easily arousable, answers orientation questions more appropriately, speaking in full sentences now rather than a single word answers HEENT:  Normocephalic atraumatic.  Sclerae nonicteric, noninjected.  Extraocular movements intact bilaterally. Neck:  Without mass or deformity.  Trachea is midline. Lungs:  Clear to auscultate bilaterally without rhonchi, wheeze, or rales. Heart:  Regular rate and rhythm.  Without murmurs, rubs, or gallops. Abdomen:  Soft, nontender, nondistended.  Without guarding or rebound. Extremities: Without cyanosis, clubbing, edema, or obvious deformity. Vascular:  Dorsalis pedis and posterior tibial pulses palpable bilaterally. Skin:  Warm and dry, no erythema, no  ulcerations.  Scheduled Medications:   acyclovir  400 mg Oral BID   allopurinol  300 mg Oral Daily   aspirin  325 mg Oral Daily   Chlorhexidine Gluconate Cloth  6 each Topical Daily   escitalopram  10 mg Oral Daily   fluticasone  2 spray Each Nare Daily   influenza vac split quadrivalent PF  0.5 mL Intramuscular Tomorrow-1000   lacosamide  100 mg Oral BID   levETIRAcetam  1,000 mg Oral BID   magnesium chloride  1 tablet Oral Daily   melatonin  3 mg Oral QHS   pantoprazole  40 mg Oral Daily   phenobarbital  32.4 mg Oral Daily   pregabalin  200 mg Oral BID   QUEtiapine  12.5 mg Oral Daily   rosuvastatin  20 mg Oral Daily   sodium chloride flush  10-40 mL Intracatheter Q12H   traZODone  50 mg Oral QHS   PRN Medications:  acetaminophen **OR** acetaminophen, albuterol, bisacodyl, diclofenac Sodium, fentaNYL (SUBLIMAZE) injection, LORazepam, ondansetron **OR** ondansetron (ZOFRAN) IV, oxyCODONE, polyvinyl alcohol, sodium chloride flush, sodium phosphate  Antimicrobials:  Anti-infectives (From admission, onward)    Start     Dose/Rate Route Frequency Ordered Stop   06/19/22 1200  vancomycin (VANCOREADY) IVPB 1500 mg/300 mL  Status:  Discontinued  1,500 mg 150 mL/hr over 120 Minutes Intravenous Every 24 hours 06/18/22 1152 06/18/22 1514   06/18/22 2200  acyclovir (ZOVIRAX) tablet 400 mg       Note to Pharmacy: TAKE (1) TABLET BY MOUTH TWICE DAILY.     400 mg Oral 2 times daily 06/18/22 1514     06/18/22 2000  ceFEPIme (MAXIPIME) 2 g in sodium chloride 0.9 % 100 mL IVPB  Status:  Discontinued        2 g 200 mL/hr over 30 Minutes Intravenous Every 8 hours 06/18/22 1152 06/18/22 1514   06/18/22 1200  vancomycin (VANCOREADY) IVPB 2000 mg/400 mL        2,000 mg 200 mL/hr over 120 Minutes Intravenous  Once 06/18/22 1149 06/18/22 1451   06/18/22 1145  ceFEPIme (MAXIPIME) 2 g in sodium chloride 0.9 % 100 mL IVPB        2 g 200 mL/hr over 30 Minutes Intravenous  Once 06/18/22 1144  06/18/22 1304   06/18/22 1145  metroNIDAZOLE (FLAGYL) IVPB 500 mg        500 mg 100 mL/hr over 60 Minutes Intravenous  Once 06/18/22 1144 06/18/22 1431   06/18/22 1145  vancomycin (VANCOCIN) IVPB 1000 mg/200 mL premix  Status:  Discontinued        1,000 mg 200 mL/hr over 60 Minutes Intravenous  Once 06/18/22 1144 06/18/22 1149       Data Reviewed: I have personally reviewed following labs and imaging studies  CBC: Recent Labs  Lab 07/03/22 0847 07/06/22 0620  WBC 5.6 5.2  HGB 8.2* 7.0*  HCT 25.1* 20.4*  MCV 91.9 90.7  PLT 58* 47*    Basic Metabolic Panel: Recent Labs  Lab 07/01/22 0320 07/02/22 0455 07/03/22 0847 07/06/22 0620  NA 133* 131* 130* 132*  K 3.6 3.9 4.1 3.2*  CL 101 99 99 101  CO2 _0 GLUCOSE 107* 109* 118* 138*  BUN _1 CREATININE 0.57 0.53 0.57 0.71  CALCIUM 7.6* 7.7* 8.0* 7.7*  MG 1.6* 1.5*  --   --     Most Recent Urinalysis On File:     Component Value Date/Time   COLORURINE YELLOW 06/18/2022 1342   APPEARANCEUR CLOUDY (A) 06/18/2022 1342   APPEARANCEUR Cloudy (A) 05/02/2022 1113   LABSPEC 1.028 06/18/2022 1342   PHURINE 5.0 06/18/2022 1342   GLUCOSEU NEGATIVE 06/18/2022 1342   HGBUR NEGATIVE 06/18/2022 1342   BILIRUBINUR NEGATIVE 06/18/2022 1342   BILIRUBINUR Negative 05/02/2022 1113   KETONESUR 5 (A) 06/18/2022 1342   PROTEINUR 30 (A) 06/18/2022 1342   UROBILINOGEN 0.2 11/27/2021 1534   UROBILINOGEN 0.2 05/07/2012 0829   NITRITE NEGATIVE 06/18/2022 1342   LEUKOCYTESUR NEGATIVE 06/18/2022 1342     LOS: 19 days   Little Ishikawa, DO [facility] 07/07/2022, 8:28 AM   If 7PM-7AM, please contact night coverage [email or web address]

## 2022-07-07 NOTE — Plan of Care (Signed)
  Problem: Education: Goal: Knowledge of General Education information will improve Description: Including pain rating scale, medication(s)/side effects and non-pharmacologic comfort measures Outcome: Progressing   Problem: Health Behavior/Discharge Planning: Goal: Ability to manage health-related needs will improve Outcome: Progressing   Problem: Clinical Measurements: Goal: Ability to maintain clinical measurements within normal limits will improve Outcome: Progressing Goal: Will remain free from infection Outcome: Progressing Goal: Diagnostic test results will improve Outcome: Progressing Goal: Respiratory complications will improve Outcome: Progressing Goal: Cardiovascular complication will be avoided Outcome: Progressing   Problem: Activity: Goal: Risk for activity intolerance will decrease Outcome: Progressing   Problem: Nutrition: Goal: Adequate nutrition will be maintained Outcome: Progressing   Problem: Coping: Goal: Level of anxiety will decrease Outcome: Progressing   Problem: Elimination: Goal: Will not experience complications related to bowel motility Outcome: Progressing Goal: Will not experience complications related to urinary retention Outcome: Progressing   Problem: Pain Managment: Goal: General experience of comfort will improve Outcome: Progressing   Problem: Safety: Goal: Ability to remain free from injury will improve Outcome: Progressing   Problem: Skin Integrity: Goal: Risk for impaired skin integrity will decrease Outcome: Progressing   Problem: Education: Goal: Knowledge of disease or condition will improve Outcome: Progressing Goal: Knowledge of secondary prevention will improve (MUST DOCUMENT ALL) Outcome: Progressing Goal: Knowledge of patient specific risk factors will improve Elta [place] N/A or DELETE if not current risk factor) Outcome: Progressing   Problem: Ischemic Stroke/TIA Tissue Perfusion: Goal: Complications of ischemic  stroke/TIA will be minimized Outcome: Progressing   Problem: Coping: Goal: Will verbalize positive feelings about self Outcome: Progressing Goal: Will identify appropriate support needs Outcome: Progressing   Problem: Self-Care: Goal: Ability to participate in self-care as condition permits will improve Outcome: Progressing Goal: Verbalization of feelings and concerns over difficulty with self-care will improve Outcome: Progressing Goal: Ability to communicate needs accurately will improve Outcome: Progressing   Problem: Nutrition: Goal: Risk of aspiration will decrease Outcome: Progressing   Problem: Education: Goal: Expressions of having a comfortable level of knowledge regarding the disease process will increase Outcome: Progressing   Problem: Coping: Goal: Ability to adjust to condition or change in health will improve Outcome: Progressing Goal: Ability to identify appropriate support needs will improve Outcome: Progressing   Problem: Health Behavior/Discharge Planning: Goal: Compliance with prescribed medication regimen will improve Outcome: Progressing   Problem: Medication: Goal: Risk for medication side effects will decrease Outcome: Progressing   Problem: Clinical Measurements: Goal: Complications related to the disease process, condition or treatment will be avoided or minimized Outcome: Progressing Goal: Diagnostic test results will improve Outcome: Progressing   Problem: Safety: Goal: Verbalization of understanding the information provided will improve Outcome: Progressing   Problem: Self-Concept: Goal: Level of anxiety will decrease Outcome: Progressing Goal: Ability to verbalize feelings about condition will improve Outcome: Progressing   Problem: Safety: Goal: Non-violent Restraint(s) Outcome: Progressing

## 2022-07-07 NOTE — Progress Notes (Addendum)
Patient husband want the EEG leads to be removed , They were "told eight hours and it has been well over eight hours" per husband.  Patients' husband want to take patient home as soon as possible.  LTM maint complete - no skin breakdown   Head wrap removed, O1 O2 fixed. [facility] monitored, Event button test confirmed by [facility].

## 2022-07-07 NOTE — Progress Notes (Signed)
LTM EEG discontinued - no skin breakdown at unhook.   

## 2022-07-08 DIAGNOSIS — R569 Unspecified convulsions: Secondary | ICD-10-CM | POA: Diagnosis not present

## 2022-07-08 NOTE — Plan of Care (Signed)
  Problem: Education: Goal: Knowledge of General Education information will improve Description: Including pain rating scale, medication(s)/side effects and non-pharmacologic comfort measures Outcome: Progressing   Problem: Health Behavior/Discharge Planning: Goal: Ability to manage health-related needs will improve Outcome: Progressing   Problem: Clinical Measurements: Goal: Ability to maintain clinical measurements within normal limits will improve Outcome: Progressing Goal: Will remain free from infection Outcome: Progressing Goal: Diagnostic test results will improve Outcome: Progressing Goal: Respiratory complications will improve Outcome: Progressing Goal: Cardiovascular complication will be avoided Outcome: Progressing   Problem: Activity: Goal: Risk for activity intolerance will decrease Outcome: Progressing   Problem: Nutrition: Goal: Adequate nutrition will be maintained Outcome: Progressing   Problem: Coping: Goal: Level of anxiety will decrease Outcome: Progressing   Problem: Elimination: Goal: Will not experience complications related to bowel motility Outcome: Progressing Goal: Will not experience complications related to urinary retention Outcome: Progressing   Problem: Pain Managment: Goal: General experience of comfort will improve Outcome: Progressing   Problem: Safety: Goal: Ability to remain free from injury will improve Outcome: Progressing   Problem: Skin Integrity: Goal: Risk for impaired skin integrity will decrease Outcome: Progressing   Problem: Education: Goal: Knowledge of disease or condition will improve Outcome: Progressing Goal: Knowledge of secondary prevention will improve (MUST DOCUMENT ALL) Outcome: Progressing Goal: Knowledge of patient specific risk factors will improve Elta [place] N/A or DELETE if not current risk factor) Outcome: Progressing   Problem: Ischemic Stroke/TIA Tissue Perfusion: Goal: Complications of ischemic  stroke/TIA will be minimized Outcome: Progressing   Problem: Coping: Goal: Will verbalize positive feelings about self Outcome: Progressing Goal: Will identify appropriate support needs Outcome: Progressing   Problem: Self-Care: Goal: Ability to participate in self-care as condition permits will improve Outcome: Progressing Goal: Verbalization of feelings and concerns over difficulty with self-care will improve Outcome: Progressing Goal: Ability to communicate needs accurately will improve Outcome: Progressing   Problem: Nutrition: Goal: Risk of aspiration will decrease Outcome: Progressing   Problem: Education: Goal: Expressions of having a comfortable level of knowledge regarding the disease process will increase Outcome: Progressing   Problem: Coping: Goal: Ability to adjust to condition or change in health will improve Outcome: Progressing Goal: Ability to identify appropriate support needs will improve Outcome: Progressing   Problem: Health Behavior/Discharge Planning: Goal: Compliance with prescribed medication regimen will improve Outcome: Progressing   Problem: Medication: Goal: Risk for medication side effects will decrease Outcome: Progressing   Problem: Clinical Measurements: Goal: Complications related to the disease process, condition or treatment will be avoided or minimized Outcome: Progressing Goal: Diagnostic test results will improve Outcome: Progressing   Problem: Safety: Goal: Verbalization of understanding the information provided will improve Outcome: Progressing   Problem: Self-Concept: Goal: Level of anxiety will decrease Outcome: Progressing Goal: Ability to verbalize feelings about condition will improve Outcome: Progressing   Problem: Safety: Goal: Non-violent Restraint(s) Outcome: Progressing

## 2022-07-08 NOTE — Progress Notes (Signed)
Physical Therapy Treatment Patient Details Name: Joanna Reid MRN: [id number] DOB: 02-26-1966 Today's Date: 07/08/2022   History of Present Illness 56 yo female presents to [facility] on 12/11 with headache and lethargy. + seizures in ED. MRI brain shows small incidental stroke in the right caudate. EEG shows Continuous L lateralized periodic discharges (high risk for seizures). Pt with multiple myeloma currently undergoing treatment last chemo 2 weeks ago.  Other PMH includes hypertension, gout, GERD, type 2 diabetes mellitus, migraine headaches, stroke with residual aphasia    PT Comments    Notified by nursing that patient was more awake today and husband had gotten her to [facility] and back earlier. Worked with pt and tried to get her to Advice worker Stedy. Pt lethargic but able to arouse for brief periods. Stood with Charlaine Dalton and heavy 2 person assist from elevated bed. Unsure if pt would be able to stand from the lower recliner especially if lethargic and no maximove lift pad available on the unit. Returned pt to bed.    Recommendations for follow up therapy are one component of a multi-disciplinary discharge planning process, led by the attending physician.  Recommendations may be updated based on patient status, additional functional criteria and insurance authorization.  Follow Up Recommendations  Acute inpatient rehab (3hours/day)     Assistance Recommended at Discharge Frequent or constant Supervision/Assistance  Patient can return home with the following Two people to help with walking and/or transfers;A lot of help with bathing/dressing/bathroom;Assistance with cooking/housework;Direct supervision/assist for medications management;Help with stairs or ramp for entrance   Equipment Recommendations  Other (comment) (TBD)    Recommendations for Other Services       Precautions / Restrictions Precautions Precautions: Fall     Mobility  Bed Mobility Overal bed mobility: Needs  Assistance Bed Mobility: Rolling, Supine to Sit, Sit to Supine Rolling: Max assist   Supine to sit: Max assist, HOB elevated Sit to supine: +2 for physical assistance, Max assist   General bed mobility comments: Assist to bring legs off of bed, elevate trunk into sitting, and bring hips to EOB. Assist to lower trunk and bring legs back up into bed.    Transfers Overall transfer level: Needs assistance Equipment used: Ambulation equipment used Transfers: Sit to/from Stand Sit to Stand: +2 physical assistance, Mod assist, Max assist, From elevated surface           General transfer comment: Used Stedy to stand from bed with +2 mod/max assist to bring hips up and for trunk control. Once sitting on Stedy seat pt leaning forward with trunk flexion and propping forearms on bar. No maximove pad available to place in recliner so pt back to bed. Standing from [place] with +2 mod assist .    Ambulation/Gait                   Stairs             Wheelchair Mobility    Modified Rankin (Stroke Patients Only) Modified Rankin (Stroke Patients Only) Pre-Morbid Rankin Score: No significant disability Modified Rankin: Severe disability     Balance Overall balance assessment: Needs assistance Sitting-balance support: Single extremity supported, Feet supported Sitting balance-Leahy Scale: Poor Sitting balance - Comments: UE support and min guard for static sitting Postural control: Posterior lean Standing balance support: Bilateral upper extremity supported Standing balance-Leahy Scale: Zero Standing balance comment: Stedy and +2 mod/max assist  Cognition Arousal/Alertness: Lethargic Behavior During Therapy: Flat affect Overall Cognitive Status: Impaired/Different from baseline Area of Impairment: Attention, Following commands, Safety/judgement, Problem solving, Awareness                   Current Attention Level:  Sustained   Following Commands: Follows one step commands inconsistently, Follows one step commands with increased time Safety/Judgement: Decreased awareness of safety, Decreased awareness of deficits Awareness: Intellectual Problem Solving: Slow processing, Decreased initiation, Difficulty sequencing, Requires verbal cues, Requires tactile cues General Comments: Per nursing pt more alert today. With me pt lethargic but would arouse to stimulation. Would maintain eyes open for ~5 seconds        Exercises      General Comments        Pertinent Vitals/Pain Pain Assessment Pain Assessment: Faces Faces Pain Scale: No hurt    Home Living                          Prior Function            PT Goals (current goals can now be found in the care plan section) Acute Rehab PT Goals PT Goal Formulation: Patient unable to participate in goal setting Time For Goal Achievement: 07/22/22 Potential to Achieve Goals: Fair Progress towards PT goals: Goals downgraded-see care plan    Frequency    Min 3X/week      PT Plan Current plan remains appropriate;Frequency needs to be updated    Co-evaluation              AM-PAC PT "6 Clicks" Mobility   Outcome Measure  Help needed turning from your back to your side while in a flat bed without using bedrails?: A Lot Help needed moving from lying on your back to sitting on the side of a flat bed without using bedrails?: A Lot Help needed moving to and from a bed to a chair (including a wheelchair)?: Total Help needed standing up from a chair using your arms (e.g., wheelchair or bedside chair)?: Total Help needed to walk in hospital room?: Total Help needed climbing 3-5 steps with a railing? : Total 6 Click Score: 8    End of Session Equipment Utilized During Treatment: Gait belt Activity Tolerance: Patient limited by fatigue;Patient limited by lethargy Patient left: in bed;with call bell/phone within reach;with bed alarm  set;with family/visitor present Nurse Communication: Mobility status;Need for lift equipment PT Visit Diagnosis: Other abnormalities of gait and mobility (R26.89);Muscle weakness (generalized) (M62.81)     Time: 3491-7915 PT Time Calculation (min) (ACUTE ONLY): 22 min  Charges:  $Therapeutic Activity: 8-22 mins                     [place] [facility] [place] 07/08/2022, 1:03 PM

## 2022-07-08 NOTE — Progress Notes (Signed)
Pt husband refused for bath and care  and v/s spot checked as relayed by NA Jubert since pt was sleeping and requested not to be disturb

## 2022-07-08 NOTE — Progress Notes (Signed)
Pt refused. Agitated

## 2022-07-08 NOTE — Progress Notes (Signed)
PROGRESS NOTE    Joanna Reid   TDH:[id number] DOB: 05/25/1966  DOA: 06/18/2022 Date of Service: 07/08/22 PCP: Lindell Spar, MD  Brief Narrative / Interim history: 56 year old female with refractory multiple myeloma status post relapse, hypertension, gout, GERD, type 2 diabetes mellitus, migraine headaches, stroke with residual aphasia presented to ED for ongoing weakness and lethargy. Last had chemotherapy 2 weeks ago. She has recently received PRBC and platelets in the cancer center. While in the ED patient had acute mental status change, unarousable with full tonic-clonic seizure lasting 2 to 3 minutes which improved with lorazepam. Transferred to [place] for further neuro evaluation and follow-up as well as continuous EEG.  She has been placed on multiple antiepileptics, now some AEDs have been discontinued or are being tapered down as below under guidance from neurology. Transitioned to oral phenobarbital, Vimpat, Keppra as of 12/23. Mental status continues to improve slowly - complicated by poor sleep cycle and likely concurrent hospital delirium.    Consultants:  Neurology  Oncology   Assesement and Plan:  Principal Problem:   Generalized weakness Active Problems:   Multiple myeloma (HCC)   Hyperlipidemia   Acute on chronic anemia   Leukopenia   Stroke (cerebrum) -Left parietal lobe, 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    Chronic pain disorder   LVH (left ventricular hypertrophy)   Morbid obesity with BMI of 45.0-49.9, adult (HCC)   Drug-induced polyneuropathy (HCC)   History of CVA with residual deficit   Overactive bladder   Subclinical hypothyroidism   Hypokalemia   Grand mal seizure ([place])   Hypomagnesemia   Status epilepticus (HCC)   New Onset Partial Status Epilepticus in the setting of underlying CVA, acute metabolic encephalopathy  MRI on admission showed a 2 mm acute white matter infarct lateral to the right caudate head, and also  tiny subacute infarcts in the anterior right corona radiata and left occipital lobe white matter.   EEG was concerning for possible seizures.   Workup with a 2D echo showed normal EF 65 to 64%, grade 1 diastolic dysfunction, bubble study was negative, A1c 7.2.  converted convert IV antiepileptics to p.o. -->  Transitioned to oral phenobarbital, Vimpat, Keppra as of 12/23.  Continue aspirin and statin for stroke.     Will need rehab SNF Outpatient plan: F/u with neuro in 3 months. May consider repeat MRI brain at that time if still has language deficits   Pt remains fairly somnolent/confused, neurology reassessing - sleep schedule improving on increased Seroquel dose, continue at this time Per Neuro -given negative EEG and improving mental status with improved sleep hygiene no indication for transitioning or changing antiepileptics at this time.  Abdominal pain  Secondary to nephrolithiasis(nonobstructive) Complicated by constipation, resolved Incidental liver lesions noted  Patient indicates her previous flank pain is consistent with her history of nephrolithiasis, CTA notable for nonobstructing nephrolithasis -per husband these are somewhat chronic.   Abdominal imaging incidentally showed multiple liver lesions, which will require an MRI as an outpatient.   Hypomagnesemia, hypokalemia  Continue to monitor PRN    Pancytopenia  Secondary to myeloma/treatment  Plt count improving, Hgb downtrending slowly Repeat labs in am Received a total of 3 units of packed red blood cells, last one 12/20.    Refractory Multiple Myeloma  Followed by Dr. Delton Coombes.  Last chemo given 2 weeks prior to admit  DVT prophylaxis: SCD Pertinent IV fluids/nutrition: dysphagia 2 diet per SLP Central lines / invasive devices: R  chest port   Code Status: FULL CODE   Disposition: inpatient -tentative discharge for SNF per PT recommendations, patient indicating he is going to take the patient home.  Will need  to ensure safe disposition and all equipment will be provided prior to discharge given her comorbidities as above TOC needs: SNF placement Barriers to discharge / significant pending items: placement, mentation improvement, neurology clearance for discharge, likely able to discharge in the next 24 to 48 hours assuming clinical course continues to improve.  Subjective:  No acute issues or events overnight, slept better overnight than previous, appears to be somewhat more awake today although still not yet back to baseline.  Hopeful to work with physical therapy today and get out of bed.  Family Communication: Husband at bedside, updated on plan  Objective Findings:  Vitals:   07/07/22 1554 07/07/22 1953 07/08/22 0000 07/08/22 0400  BP: 126/72 128/69    Pulse: 99 95    Resp: _0 Temp: 98.5 F (36.9 C) 98.5 F (36.9 C)    TempSrc: Oral Oral    SpO2: 100% 100%    Weight:      Height:        Intake/Output Summary (Last 24 hours) at 07/08/2022 0754 Last data filed at 07/07/2022 1900 Gross per 24 hour  Intake 670 ml  Output --  Net 670 ml    Filed Weights   06/18/22 1728 06/19/22 0500 07/06/22 0600  Weight: 109.3 kg 109.4 kg 110.3 kg    Examination:   General:  Pleasantly resting in bed, No acute distress.  Somnolent but easily arousable, answers orientation questions more appropriately, speaking in full sentences now rather than a single word answers HEENT:  Normocephalic atraumatic.  Sclerae nonicteric, noninjected.  Extraocular movements intact bilaterally. Neck:  Without mass or deformity.  Trachea is midline. Lungs:  Clear to auscultate bilaterally without rhonchi, wheeze, or rales. Heart:  Regular rate and rhythm.  Without murmurs, rubs, or gallops. Abdomen:  Soft, nontender, nondistended.  Without guarding or rebound. Extremities: Without cyanosis, clubbing, scant bilateral lower extremity edema. Vascular:  Dorsalis pedis and posterior tibial pulses palpable  bilaterally. Skin:  Warm and dry, no erythema, no ulcerations.  Scheduled Medications:   acyclovir  400 mg Oral BID   allopurinol  300 mg Oral Daily   aspirin  325 mg Oral Daily   Chlorhexidine Gluconate Cloth  6 each Topical Daily   escitalopram  10 mg Oral Daily   fluticasone  2 spray Each Nare Daily   influenza vac split quadrivalent PF  0.5 mL Intramuscular Tomorrow-1000   lacosamide  100 mg Oral BID   levETIRAcetam  1,000 mg Oral BID   magnesium chloride  1 tablet Oral Daily   melatonin  3 mg Oral QHS   pantoprazole  40 mg Oral Daily   pregabalin  200 mg Oral BID   QUEtiapine  25 mg Oral Daily   rosuvastatin  20 mg Oral Daily   sodium chloride flush  10-40 mL Intracatheter Q12H   traZODone  50 mg Oral QHS   PRN Medications:  acetaminophen **OR** acetaminophen, albuterol, bisacodyl, diclofenac Sodium, fentaNYL (SUBLIMAZE) injection, LORazepam, ondansetron **OR** ondansetron (ZOFRAN) IV, oxyCODONE, polyvinyl alcohol, sodium chloride flush, sodium phosphate  Antimicrobials:  Anti-infectives (From admission, onward)    Start     Dose/Rate Route Frequency Ordered Stop   06/19/22 1200  vancomycin (VANCOREADY) IVPB 1500 mg/300 mL  Status:  Discontinued        1,500  mg 150 mL/hr over 120 Minutes Intravenous Every 24 hours 06/18/22 1152 06/18/22 1514   06/18/22 2200  acyclovir (ZOVIRAX) tablet 400 mg       Note to Pharmacy: TAKE (1) TABLET BY MOUTH TWICE DAILY.     400 mg Oral 2 times daily 06/18/22 1514     06/18/22 2000  ceFEPIme (MAXIPIME) 2 g in sodium chloride 0.9 % 100 mL IVPB  Status:  Discontinued        2 g 200 mL/hr over 30 Minutes Intravenous Every 8 hours 06/18/22 1152 06/18/22 1514   06/18/22 1200  vancomycin (VANCOREADY) IVPB 2000 mg/400 mL        2,000 mg 200 mL/hr over 120 Minutes Intravenous  Once 06/18/22 1149 06/18/22 1451   06/18/22 1145  ceFEPIme (MAXIPIME) 2 g in sodium chloride 0.9 % 100 mL IVPB        2 g 200 mL/hr over 30 Minutes Intravenous  Once  06/18/22 1144 06/18/22 1304   06/18/22 1145  metroNIDAZOLE (FLAGYL) IVPB 500 mg        500 mg 100 mL/hr over 60 Minutes Intravenous  Once 06/18/22 1144 06/18/22 1431   06/18/22 1145  vancomycin (VANCOCIN) IVPB 1000 mg/200 mL premix  Status:  Discontinued        1,000 mg 200 mL/hr over 60 Minutes Intravenous  Once 06/18/22 1144 06/18/22 1149       Data Reviewed: I have personally reviewed following labs and imaging studies  CBC: Recent Labs  Lab 07/03/22 0847 07/06/22 0620  WBC 5.6 5.2  HGB 8.2* 7.0*  HCT 25.1* 20.4*  MCV 91.9 90.7  PLT 58* 47*    Basic Metabolic Panel: Recent Labs  Lab 07/02/22 0455 07/03/22 0847 07/06/22 0620  NA 131* 130* 132*  K 3.9 4.1 3.2*  CL 99 99 101  CO2 _0 GLUCOSE 109* 118* 138*  BUN _1 CREATININE 0.53 0.57 0.71  CALCIUM 7.7* 8.0* 7.7*  MG 1.5*  --   --     Most Recent Urinalysis On File:     Component Value Date/Time   COLORURINE YELLOW 06/18/2022 1342   APPEARANCEUR CLOUDY (A) 06/18/2022 1342   APPEARANCEUR Cloudy (A) 05/02/2022 1113   LABSPEC 1.028 06/18/2022 1342   PHURINE 5.0 06/18/2022 1342   GLUCOSEU NEGATIVE 06/18/2022 1342   HGBUR NEGATIVE 06/18/2022 1342   BILIRUBINUR NEGATIVE 06/18/2022 1342   BILIRUBINUR Negative 05/02/2022 1113   KETONESUR 5 (A) 06/18/2022 1342   PROTEINUR 30 (A) 06/18/2022 1342   UROBILINOGEN 0.2 11/27/2021 1534   UROBILINOGEN 0.2 05/07/2012 0829   NITRITE NEGATIVE 06/18/2022 1342   LEUKOCYTESUR NEGATIVE 06/18/2022 1342     LOS: 20 days   Little Ishikawa, DO [facility] 07/08/2022, 7:54 AM   If 7PM-7AM, please contact night coverage [email or web address]

## 2022-07-09 DIAGNOSIS — R569 Unspecified convulsions: Secondary | ICD-10-CM | POA: Diagnosis not present

## 2022-07-09 LAB — BASIC METABOLIC PANEL
Anion gap: 10 (ref 5–15)
BUN: 12 mg/dL (ref 6–20)
CO2: 25 mmol/L (ref 22–32)
Calcium: 7.7 mg/dL — ABNORMAL LOW (ref 8.9–10.3)
Chloride: 101 mmol/L (ref 98–111)
Creatinine, Ser: 0.7 mg/dL (ref 0.44–1.00)
GFR, Estimated: >60 mL/min (ref >60)
Glucose, Bld: 91 mg/dL (ref 70–99)
Potassium: 3.2 mmol/L — ABNORMAL LOW (ref 3.5–5.1)
Sodium: 136 mmol/L (ref 135–145)

## 2022-07-09 LAB — PREPARE RBC (CROSSMATCH)

## 2022-07-09 LAB — CBC
HCT: 20.1 % — ABNORMAL LOW (ref 36.0–46.0)
Hemoglobin: 6.8 g/dL — CL (ref 12.0–15.0)
MCH: 30.9 pg (ref 26.0–34.0)
MCHC: 33.8 g/dL (ref 30.0–36.0)
MCV: 91.4 fL (ref 80.0–100.0)
Platelets: 44 10*3/uL — ABNORMAL LOW (ref 150–400)
RBC: 2.2 MIL/uL — ABNORMAL LOW (ref 3.87–5.11)
RDW: 20.4 % — ABNORMAL HIGH (ref 11.5–15.5)
WBC: 5.5 10*3/uL (ref 4.0–10.5)
nRBC: 1.1 % — ABNORMAL HIGH (ref 0.0–0.2)

## 2022-07-09 MED ORDER — POTASSIUM CHLORIDE CRYS ER 20 MEQ PO TBCR
40.0000 meq | EXTENDED_RELEASE_TABLET | ORAL | Status: AC
  Administered 2022-07-09 (×2): 40 meq via ORAL
  Filled 2022-07-09 (×2): qty 2

## 2022-07-09 MED ORDER — FUROSEMIDE 10 MG/ML IJ SOLN
20.0000 mg | Freq: Four times a day (QID) | INTRAMUSCULAR | Status: AC
  Administered 2022-07-09 (×2): 20 mg via INTRAVENOUS
  Filled 2022-07-09 (×2): qty 4

## 2022-07-09 MED ORDER — SODIUM CHLORIDE 0.9% IV SOLUTION: Freq: Once | INTRAVENOUS | Status: AC

## 2022-07-09 NOTE — Progress Notes (Signed)
PROGRESS NOTE    Joanna Reid   SPQ:[id number] DOB: [birth year]  DOA: 06/18/2022 Date of Service: 07/09/22 PCP: Lindell Spar, MD  Brief Narrative / Interim history: 57 year old female with refractory multiple myeloma status post relapse, hypertension, gout, GERD, type 2 diabetes mellitus, migraine headaches, stroke with residual aphasia presented to ED for ongoing weakness and lethargy. Last had chemotherapy 2 weeks ago. She has recently received PRBC and platelets in the cancer center. While in the ED patient had acute mental status change, unarousable with full tonic-clonic seizure lasting 2 to 3 minutes which improved with lorazepam. Transferred to [facility] for further neuro evaluation and follow-up as well as continuous EEG.  She has been placed on multiple antiepileptics, now some AEDs have been discontinued or are being tapered down as below under guidance from neurology. Transitioned to oral phenobarbital, Vimpat, Keppra as of 12/23. Mental status continues to improve slowly - complicated by poor sleep cycle and likely concurrent hospital delirium.    Consultants:  Neurology  Oncology   Assesement and Plan:  Principal Problem:   Generalized weakness Active Problems:   Multiple myeloma (HCC)   Hyperlipidemia   Acute on chronic anemia   Leukopenia   Stroke (cerebrum) -Left parietal lobe, 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    Chronic pain disorder   LVH (left ventricular hypertrophy)   Morbid obesity with BMI of 45.0-49.9, adult (HCC)   Drug-induced polyneuropathy (HCC)   History of CVA with residual deficit   Overactive bladder   Subclinical hypothyroidism   Hypokalemia   Grand mal seizure ([place])   Hypomagnesemia   Status epilepticus (HCC)   New Onset Partial Status Epilepticus in the setting of underlying CVA, acute metabolic encephalopathy  MRI on admission showed a 2 mm acute white matter infarct lateral to the right caudate head, and also  tiny subacute infarcts in the anterior right corona radiata and left occipital lobe white matter.   EEG was concerning for possible seizures.   Workup with a 2D echo showed normal EF 65 to 33%, grade 1 diastolic dysfunction, bubble study was negative, A1c 7.2.  converted convert IV antiepileptics to p.o. -->  Transitioned to oral phenobarbital, Vimpat, Keppra as of 12/23.  Continue aspirin and statin for stroke.     Will need rehab SNF Outpatient plan: F/u with neuro in 3 months. May consider repeat MRI brain at that time if still has language deficits   Pt remains fairly somnolent/confused, neurology reassessing - sleep schedule improving on increased Seroquel dose, continue at this time Per Neuro -given negative EEG and improving mental status with improved sleep hygiene no indication for transitioning or changing antiepileptics at this time.  Abdominal pain  Secondary to nephrolithiasis(nonobstructive) Complicated by constipation, resolved Incidental liver lesions noted  Patient indicates her previous flank pain is consistent with her history of nephrolithiasis, CTA notable for nonobstructing nephrolithasis -per husband these are somewhat chronic.   Abdominal imaging incidentally showed multiple liver lesions, which will require an MRI as an outpatient.   Hypomagnesemia, hypokalemia  Continue to monitor PRN - replete as appropriate    Pancytopenia  Secondary to myeloma/treatment  Plt count stabilzing around 40/50, Hgb downtrending slowly -6.8 today 2u PRBC ordered today Received a total of 3 units of packed red blood cells previously given (Dec 11th, 18th, 20th)    Refractory Multiple Myeloma  Followed by Dr. Delton Coombes.  Last chemo given 2 weeks prior to admit  DVT prophylaxis: SCD Pertinent IV fluids/nutrition:  dysphagia 2 diet per SLP Central lines / invasive devices: R chest port   Code Status: FULL CODE   Disposition: inpatient -tentative discharge home with home health PT  OT and likely nursing, family is wishing to take patient home rather than rehab at this point as they are able to provide care TOC needs: SNF placement canceled, transition home with home health PT OT nursing Barriers to discharge / significant pending items: placement, mentation improvement, neurology clearance for discharge, likely able to discharge in the next 24 to 48 hours assuming clinical course continues to improve.  Subjective:  No acute issues or events overnight, mentation continues to improve slowly, no issues overnight, patient continues to sleep moderately well, not yet through the night but certainly improved from prior  Family Communication: Husband at bedside, updated on plan  Objective Findings:  Vitals:   07/08/22 1300 07/08/22 1731 07/08/22 2013 07/09/22 0600  BP: 103/69 104/68 (!) 111/56 108/69  Pulse: 82  94 98  Resp: _0 Temp: (!) 97.5 F (36.4 C) 98.3 F (36.8 C) 97.7 F (36.5 C) 98.4 F (36.9 C)  TempSrc: Oral Oral Oral Oral  SpO2: 100%  99% 98%  Weight:      Height:        Intake/Output Summary (Last 24 hours) at 07/09/2022 0726 Last data filed at 07/08/2022 1500 Gross per 24 hour  Intake 510 ml  Output --  Net 510 ml    Filed Weights   06/18/22 1728 06/19/22 0500 07/06/22 0600  Weight: 109.3 kg 109.4 kg 110.3 kg    Examination:   General:  Pleasantly resting in bed, No acute distress.  Awake alert, much less somnolent than prior, oriented to person and place time and situation but continues to have episodes of somewhat nonsensical speech HEENT:  Normocephalic atraumatic.  Sclerae nonicteric, noninjected.  Extraocular movements intact bilaterally. Neck:  Without mass or deformity.  Trachea is midline. Lungs:  Clear to auscultate bilaterally without rhonchi, wheeze, or rales. Heart:  Regular rate and rhythm.  Without murmurs, rubs, or gallops. Abdomen:  Soft, nontender, nondistended.  Without guarding or rebound. Extremities: Without  cyanosis, clubbing, scant bilateral lower extremity edema. Vascular:  Dorsalis pedis and posterior tibial pulses palpable bilaterally. Skin:  Warm and dry, no erythema, no ulcerations.  Scheduled Medications:   sodium chloride   Intravenous Once   acyclovir  400 mg Oral BID   allopurinol  300 mg Oral Daily   aspirin  325 mg Oral Daily   Chlorhexidine Gluconate Cloth  6 each Topical Daily   escitalopram  10 mg Oral Daily   fluticasone  2 spray Each Nare Daily   influenza vac split quadrivalent PF  0.5 mL Intramuscular Tomorrow-1000   lacosamide  100 mg Oral BID   levETIRAcetam  1,000 mg Oral BID   magnesium chloride  1 tablet Oral Daily   melatonin  3 mg Oral QHS   pantoprazole  40 mg Oral Daily   pregabalin  200 mg Oral BID   QUEtiapine  25 mg Oral Daily   rosuvastatin  20 mg Oral Daily   sodium chloride flush  10-40 mL Intracatheter Q12H   traZODone  50 mg Oral QHS   PRN Medications:  acetaminophen **OR** acetaminophen, albuterol, bisacodyl, diclofenac Sodium, fentaNYL (SUBLIMAZE) injection, LORazepam, ondansetron **OR** ondansetron (ZOFRAN) IV, oxyCODONE, polyvinyl alcohol, sodium chloride flush, sodium phosphate  Antimicrobials:  Anti-infectives (From admission, onward)    Start     Dose/Rate Route Frequency  Ordered Stop   06/19/22 1200  vancomycin (VANCOREADY) IVPB 1500 mg/300 mL  Status:  Discontinued        1,500 mg 150 mL/hr over 120 Minutes Intravenous Every 24 hours 06/18/22 1152 06/18/22 1514   06/18/22 2200  acyclovir (ZOVIRAX) tablet 400 mg       Note to Pharmacy: TAKE (1) TABLET BY MOUTH TWICE DAILY.     400 mg Oral 2 times daily 06/18/22 1514     06/18/22 2000  ceFEPIme (MAXIPIME) 2 g in sodium chloride 0.9 % 100 mL IVPB  Status:  Discontinued        2 g 200 mL/hr over 30 Minutes Intravenous Every 8 hours 06/18/22 1152 06/18/22 1514   06/18/22 1200  vancomycin (VANCOREADY) IVPB 2000 mg/400 mL        2,000 mg 200 mL/hr over 120 Minutes Intravenous  Once 06/18/22  1149 06/18/22 1451   06/18/22 1145  ceFEPIme (MAXIPIME) 2 g in sodium chloride 0.9 % 100 mL IVPB        2 g 200 mL/hr over 30 Minutes Intravenous  Once 06/18/22 1144 06/18/22 1304   06/18/22 1145  metroNIDAZOLE (FLAGYL) IVPB 500 mg        500 mg 100 mL/hr over 60 Minutes Intravenous  Once 06/18/22 1144 06/18/22 1431   06/18/22 1145  vancomycin (VANCOCIN) IVPB 1000 mg/200 mL premix  Status:  Discontinued        1,000 mg 200 mL/hr over 60 Minutes Intravenous  Once 06/18/22 1144 06/18/22 1149       Data Reviewed: I have personally reviewed following labs and imaging studies  CBC: Recent Labs  Lab 07/03/22 0847 07/06/22 0620 07/09/22 0610  WBC 5.6 5.2 5.5  HGB 8.2* 7.0* 6.8*  HCT 25.1* 20.4* 20.1*  MCV 91.9 90.7 91.4  PLT 58* 47* 44*    Basic Metabolic Panel: Recent Labs  Lab 07/03/22 0847 07/06/22 0620 07/09/22 0610  NA 130* 132* 136  K 4.1 3.2* 3.2*  CL 99 101 101  CO2 _0 GLUCOSE 118* 138* 91  BUN _1 CREATININE 0.57 0.71 0.70  CALCIUM 8.0* 7.7* 7.7*    Most Recent Urinalysis On File:     Component Value Date/Time   COLORURINE YELLOW 06/18/2022 1342   APPEARANCEUR CLOUDY (A) 06/18/2022 1342   APPEARANCEUR Cloudy (A) 05/02/2022 1113   LABSPEC 1.028 06/18/2022 1342   PHURINE 5.0 06/18/2022 1342   GLUCOSEU NEGATIVE 06/18/2022 1342   HGBUR NEGATIVE 06/18/2022 1342   BILIRUBINUR NEGATIVE 06/18/2022 1342   BILIRUBINUR Negative 05/02/2022 1113   KETONESUR 5 (A) 06/18/2022 1342   PROTEINUR 30 (A) 06/18/2022 1342   UROBILINOGEN 0.2 11/27/2021 1534   UROBILINOGEN 0.2 05/07/2012 0829   NITRITE NEGATIVE 06/18/2022 1342   LEUKOCYTESUR NEGATIVE 06/18/2022 1342     LOS: 21 days   Little Ishikawa, DO [facility] 07/09/2022, 7:26 AM   If 7PM-7AM, please contact night coverage [email or web address]

## 2022-07-09 NOTE — Plan of Care (Signed)

## 2022-07-09 NOTE — Plan of Care (Signed)
  Problem: Education: Goal: Knowledge of General Education information will improve Description: Including pain rating scale, medication(s)/side effects and non-pharmacologic comfort measures Outcome: Progressing   Problem: Health Behavior/Discharge Planning: Goal: Ability to manage health-related needs will improve Outcome: Progressing   Problem: Clinical Measurements: Goal: Ability to maintain clinical measurements within normal limits will improve Outcome: Progressing Goal: Will remain free from infection Outcome: Progressing Goal: Diagnostic test results will improve Outcome: Progressing Goal: Respiratory complications will improve Outcome: Progressing Goal: Cardiovascular complication will be avoided Outcome: Progressing   Problem: Activity: Goal: Risk for activity intolerance will decrease Outcome: Progressing   Problem: Nutrition: Goal: Adequate nutrition will be maintained Outcome: Progressing   Problem: Coping: Goal: Level of anxiety will decrease Outcome: Progressing   Problem: Elimination: Goal: Will not experience complications related to bowel motility Outcome: Progressing Goal: Will not experience complications related to urinary retention Outcome: Progressing   Problem: Pain Managment: Goal: General experience of comfort will improve Outcome: Progressing   Problem: Safety: Goal: Ability to remain free from injury will improve Outcome: Progressing   Problem: Skin Integrity: Goal: Risk for impaired skin integrity will decrease Outcome: Progressing   Problem: Education: Goal: Knowledge of disease or condition will improve Outcome: Progressing Goal: Knowledge of secondary prevention will improve (MUST DOCUMENT ALL) Outcome: Progressing Goal: Knowledge of patient specific risk factors will improve Elta [place] N/A or DELETE if not current risk factor) Outcome: Progressing   Problem: Ischemic Stroke/TIA Tissue Perfusion: Goal: Complications of ischemic  stroke/TIA will be minimized Outcome: Progressing   Problem: Coping: Goal: Will verbalize positive feelings about self Outcome: Progressing Goal: Will identify appropriate support needs Outcome: Progressing   Problem: Self-Care: Goal: Ability to participate in self-care as condition permits will improve Outcome: Progressing Goal: Verbalization of feelings and concerns over difficulty with self-care will improve Outcome: Progressing Goal: Ability to communicate needs accurately will improve Outcome: Progressing   Problem: Nutrition: Goal: Risk of aspiration will decrease Outcome: Progressing   Problem: Education: Goal: Expressions of having a comfortable level of knowledge regarding the disease process will increase Outcome: Progressing   Problem: Coping: Goal: Ability to adjust to condition or change in health will improve Outcome: Progressing Goal: Ability to identify appropriate support needs will improve Outcome: Progressing   Problem: Health Behavior/Discharge Planning: Goal: Compliance with prescribed medication regimen will improve Outcome: Progressing   Problem: Medication: Goal: Risk for medication side effects will decrease Outcome: Progressing   Problem: Clinical Measurements: Goal: Complications related to the disease process, condition or treatment will be avoided or minimized Outcome: Progressing Goal: Diagnostic test results will improve Outcome: Progressing   Problem: Safety: Goal: Verbalization of understanding the information provided will improve Outcome: Progressing   Problem: Self-Concept: Goal: Level of anxiety will decrease Outcome: Progressing Goal: Ability to verbalize feelings about condition will improve Outcome: Progressing   Problem: Safety: Goal: Non-violent Restraint(s) Outcome: Progressing

## 2022-07-10 ENCOUNTER — Encounter (HOSPITAL_COMMUNITY): Payer: Self-pay | Admitting: Hematology

## 2022-07-10 ENCOUNTER — Other Ambulatory Visit: Payer: Self-pay

## 2022-07-10 ENCOUNTER — Other Ambulatory Visit (HOSPITAL_COMMUNITY): Payer: Self-pay

## 2022-07-10 DIAGNOSIS — R569 Unspecified convulsions: Secondary | ICD-10-CM | POA: Diagnosis not present

## 2022-07-10 DIAGNOSIS — C9000 Multiple myeloma not having achieved remission: Secondary | ICD-10-CM

## 2022-07-10 LAB — BPAM RBC
Blood Product Expiration Date: 202401232359
Blood Product Expiration Date: 202401232359
ISSUE DATE / TIME: 202401011330
ISSUE DATE / TIME: 202401011559
Unit Type and Rh: 6200
Unit Type and Rh: 6200

## 2022-07-10 LAB — HEMOGLOBIN AND HEMATOCRIT, BLOOD
HCT: 25.6 % — ABNORMAL LOW (ref 36.0–46.0)
Hemoglobin: 8.8 g/dL — ABNORMAL LOW (ref 12.0–15.0)

## 2022-07-10 LAB — TYPE AND SCREEN
ABO/RH(D): A POS
Antibody Screen: POSITIVE
Donor AG Type: NEGATIVE
Donor AG Type: NEGATIVE
Unit division: 0
Unit division: 0

## 2022-07-10 NOTE — Progress Notes (Addendum)
Speech Language Pathology Treatment: Dysphagia  Patient Details Name: Joanna Reid MRN: [id number] DOB: 1966/04/01 Today's Date: 07/10/2022 Time: 3546-5681 SLP Time Calculation (min) (ACUTE ONLY): 15 min  Assessment / Plan / Recommendation Clinical Impression   Pt seen at bedside to assess readiness to advance solid textures. Pt was awake, mother present. Pt benefits from making statements of what she is going to do, rather than offering her a choice (do you want to .Marland Kitchen...). Pt was repositioned to upright posture, and accepted boluses of graham cracker and peanut butter and water via straw. No overt s/s aspiration observed during PO trials. Will advance diet to Dys3 (mechanical soft) with thin liquids, to hopefully increase PO intake. SLP will continue to follow to assess tolerance of advanced textures. Safe swallow precautions updated at [facility]    HPI HPI: Joanna Reid is a 57 year old female with refractory multiple myeloma status post relapse, hypertension, gout, GERD, type 2 diabetes mellitus, migraine headaches, stroke with residual aphasia presented to ED on 06/19/22 due to ongoing weakness and lethargy. She reportedly last had chemotherapy 2 weeks ago. She has recently received PRBC and platelets in the cancer center. Her work up revealed a K of 2.5, Hg was 7.7, WBC 2.6, platelets 15. She noted no spontaneous bleeding. While I was evaluating patient in the ED she was noted to become acutely somnolent and then eyes rolled in back of head and she had a full tonic clonic seizure lasting about 2 to 3 mins; [facility] onRobin R Reid is a 57 year old female with refractory multiple myeloma status post relapse, hypertension, gout, GERD, type 2 diabetes mellitus, migraine headaches, stroke with residual aphasia presented to ED today due to ongoing weakness and lethargy. She reportedly last had chemotherapy 2 weeks ago. She has recently received PRBC and platelets in the cancer center. Her  work up revealed a K of 2.5, Hg was 7.7, WBC 2.6, platelets 15. She noted no spontaneous bleeding. While I was evaluating patient in the ED she was noted to become acutely somnolent and then eyes rolled in back of head and she had a full tonic clonic seizure lasting about 2 to 3 mins. I called for emergency lorazepam 1 mg IV and after that was given it subsided and she was in a post-ictal state. She bit her tongue and there was some bleeding from mouth. MRI on admission showed a 2 mm acute white matter infarct lateral to the right caudate head, and also tiny subacute infarcts in the anterior right corona radiata and left occipital lobe white matter. Pt transferred to [facility] hospital from [place] has been following for dysphagia tx/management.      SLP Plan  Continue with current plan of care      Recommendations for follow up therapy are one component of a multi-disciplinary discharge planning process, led by the attending physician.  Recommendations may be updated based on patient status, additional functional criteria and insurance authorization.    Recommendations  Diet recommendations: Dysphagia 3 (mechanical soft);Thin liquid Liquids provided via: Straw;Cup Medication Administration: Whole meds with liquid Supervision: Staff to assist with self feeding;Full supervision/cueing for compensatory strategies Compensations: Slow rate;Small sips/bites Postural Changes and/or Swallow Maneuvers: Seated upright 90 degrees                Oral Care Recommendations: Oral care BID;Staff/trained caregiver to provide oral care Follow Up Recommendations: Follow physician's recommendations for discharge plan and follow up therapies Assistance recommended at discharge:  Frequent or constant Supervision/Assistance SLP Visit Diagnosis: Dysphagia, unspecified (R13.10) Plan: Continue with current plan of care          Joanna Reid B. Joanna Reid, [facility], [place] Speech Language  Pathologist Office: [phone]  Joanna Reid 07/10/2022, 12:43 PM

## 2022-07-10 NOTE — TOC Benefit Eligibility Note (Signed)
Patient Teacher, English as a foreign language completed.    The patient is currently admitted and upon discharge could be taking lacosamide (Vimpat) 100 mg tablets.  The current 30 day co-pay is $0.00.   The patient is insured through [place], [place] Patient [place] Patient Advocate Team Direct Number: [phone]  Fax: [phone]

## 2022-07-10 NOTE — TOC Initial Note (Signed)
Transition of Care [facility]) - Initial/Assessment Note    Patient Details  Name: Joanna Reid MRN: [id number] Date of Birth: 28-Oct-1965  Transition of [facility]) CM/SW Contact:    Pollie Friar, RN Phone Number: 07/10/2022, 2:29 PM  Clinical Narrative:                 CM spoke to patients spouse this am and the plans is for patient to d/c home tomorrow. Spouse requesting a hospital bed, wheelchair and hoyer lift. CM has arranged this through [employer] and DME will be delivered to the home. Cm has verified the address with the patients spouse.  Home health arranged through [facility]. Information on the AVS.  Pt will need PTAR home.  Pts spouse has arranged for 24 hour care at home with family. TOC following.  Expected Discharge Plan: [place] Barriers to Discharge: Continued Medical Work up   Patient Goals and CMS Choice   CMS Medicare.gov Compare Post Acute Care list provided to:: Patient Represenative (must comment) Choice offered to / list presented to : Spouse      Expected Discharge Plan and Services In-house Referral: Clinical Social Work Discharge Planning Services: CM Consult Post Acute Care Choice: Home Health, Durable Medical Equipment Living arrangements for the past 2 months: [place]                 DME Arranged: Youth worker wheelchair with seat cushion, Hospital bed, Other see comment (hoyer lift) DME Agency: [employer] Date DME Agency Contacted: 07/10/22   Representative spoke with at DME Agency: Brenton Grills HH Arranged: RN, PT, OT, Nurse's Aide [place] Agency: [place] Date [place]: 07/10/22   Representative spoke with at [place]: Tommi Rumps  Prior Living Arrangements/Services Living arrangements for the past 2 months: [place] Lives with:: Spouse Patient language and need for interpreter reviewed:: Yes        Need for Family Participation in Patient Care: Yes (Comment) Care giver  support system in place?: Yes (comment)   Criminal Activity/Legal Involvement Pertinent to Current Situation/Hospitalization: No - Comment as needed  Activities of Daily Living Home Assistive Devices/Equipment: None ADL Screening (condition at time of admission) Patient's cognitive ability adequate to safely complete daily activities?: No Is the patient deaf or have difficulty hearing?: No Does the patient have difficulty seeing, even when wearing glasses/contacts?: No Does the patient have difficulty concentrating, remembering, or making decisions?: No Patient able to express need for assistance with ADLs?: Yes Does the patient have difficulty dressing or bathing?: No Independently performs ADLs?: Yes (appropriate for developmental age) Does the patient have difficulty walking or climbing stairs?: No Weakness of Legs: None Weakness of Arms/Hands: None  Permission Sought/Granted                  Emotional Assessment           Psych Involvement: No (comment)  Admission diagnosis:  Hypokalemia [E87.6] Grand mal seizure ([place]) [G40.409] Generalized weakness [R53.1] Patient Active Problem List   Diagnosis Date Noted   Status epilepticus ([place]) 06/19/2022   Generalized weakness 06/18/2022   Hypokalemia 06/18/2022   Grand mal seizure ([place]) 06/18/2022   Hypomagnesemia 06/18/2022   Hospital discharge follow-up 04/19/2022   Acute bronchitis 04/12/2022   Subclinical hypothyroidism 02/05/2022   Encounter for general adult medical examination with abnormal findings 02/05/2022   Vitamin D deficiency 02/05/2022   Overactive bladder 11/30/2021   Hordeolum internum of left  lower eyelid 08/07/2021   Drug-induced polyneuropathy ([place]) 04/05/2021   History of CVA with residual deficit 04/05/2021   Compression fracture of L1 lumbar vertebra ([place]) 04/05/2021   Stroke (cerebrum) -Left parietal lobe, 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  12/26/2019   Hyperlipidemia 08/09/2018    Acute on chronic anemia 08/09/2018   Leukopenia 08/09/2018   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   S/P autologous bone marrow transplantation ([place]) 04/01/2018   Abnormal echocardiogram 02/04/2018   LVH (left ventricular hypertrophy) 02/04/2018   Morbid obesity with BMI of 45.0-49.9, adult ([place]) 02/04/2018   Multiple myeloma ([place]) 10/29/2017   Hypercalcemia 10/29/2017   Primary osteoarthritis of right knee 09/20/2017   Chronic pain disorder 03/21/2017   Spondylosis of lumbar spine 03/21/2017   Thoracic spondylosis without myelopathy 03/21/2017   Type 2 diabetes mellitus with other specified complication ([place]) 25/95/6387   Anxiety as acute reaction to exceptional stress 01/13/2015   Attention deficit hyperactivity disorder (ADHD), 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   Gout 01/20/2014   GERD (gastroesophageal reflux disease) 01/20/2014   Essential hypertension, benign 01/20/2014   Plantar fasciitis of left foot 09/19/2011   PCP:  Lindell Spar, MD Pharmacy:   [place], [place] [address] [place] [address] Phone: [phone] Fax: [phone]  [facility], [place] - [address] [place] [address] Phone: [phone] Fax: [phone]     Social Determinants of Health (SDOH) Social History: SDOH Screenings   Food Insecurity: No Food Insecurity (06/19/2022)  Housing: Low Risk  (06/19/2022)  Transportation Needs: No Transportation Needs (06/19/2022)  Utilities: Not At Risk (06/19/2022)  Alcohol Screen: Low Risk  (08/08/2021)  Depression (PHQ2-9): Low Risk  (04/19/2022)  Financial Resource Strain: Low Risk  (08/08/2021)  Physical Activity: Unknown (08/08/2021)  Social Connections: Socially Integrated (08/08/2021)  Stress: No Stress Concern Present (08/08/2021)  Tobacco Use: Medium Risk (06/18/2022)   SDOH Interventions:     Readmission Risk Interventions     No data to display

## 2022-07-10 NOTE — Progress Notes (Cosign Needed)
Hospital bed:  Length of Need 6 Months  The above medical condition requires: Patient requires the ability to reposition frequently  Head must be elevated greater than: 30 degrees  Bed type Semi-electric  Hoyer Lift Yes  Support Surface: Gel Overlay    Wheelchair:  Patient suffers from weakness which impairs their ability to perform daily activities like bathing, dressing, and grooming in the home.  A walker will not resolve  issue with performing activities of daily living. A wheelchair will allow patient to safely perform daily activities. Patient is not able to propel themselves in the home using a standard weight wheelchair due to general weakness. Patient can self propel in the lightweight wheelchair. Length of need 6 months .  Accessories: elevating leg rests (ELRs), wheel locks, extensions and anti-tippers.

## 2022-07-10 NOTE — Progress Notes (Signed)
Physical Therapy Treatment Patient Details Name: Joanna Reid MRN: [id number] DOB: 03-01-1966 Today's Date: 07/10/2022   History of Present Illness 57 yo female presents to [facility] on 12/11 with headache and lethargy. + seizures in ED. MRI brain shows small incidental stroke in the right caudate. EEG shows Continuous L lateralized periodic discharges (high risk for seizures). Pt with multiple myeloma currently undergoing treatment last chemo 2 weeks ago.  Other PMH includes hypertension, gout, GERD, type 2 diabetes mellitus, migraine headaches, stroke with residual aphasia    PT Comments    Pt greeted semi-reclined in bed and agreeable to PT/OT session with incremental progress towards acute goals. Pt continues to be limited by lethargy and fatigue, intermittently closing eyes during all mobility, however able to arouse for brief periods. Pt able to complete bed mobility with up to mod assist of 2 and come to stand in stedy frame with mod-max assist +2 to power up and steady. Pt continues to demonstrate poor postural control and inconsistent arousal and unsafe for pt to transfer to sitting up in recliner. Pt continues to benefit from skilled PT services to progress toward functional mobility goals.    Recommendations for follow up therapy are one component of a multi-disciplinary discharge planning process, led by the attending physician.  Recommendations may be updated based on patient status, additional functional criteria and insurance authorization.  Follow Up Recommendations  Acute inpatient rehab (3hours/day)     Assistance Recommended at Discharge Frequent or constant Supervision/Assistance  Patient can return home with the following Two people to help with walking and/or transfers;A lot of help with bathing/dressing/bathroom;Assistance with cooking/housework;Direct supervision/assist for medications management;Help with stairs or ramp for entrance   Equipment Recommendations  Other  (comment) (TBD)    Recommendations for Other Services       Precautions / Restrictions Precautions Precautions: Fall Restrictions Weight Bearing Restrictions: No     Mobility  Bed Mobility Overal bed mobility: Needs Assistance Bed Mobility: Rolling, Supine to Sit, Sit to Supine Rolling: Max assist   Supine to sit: Max assist, HOB elevated Sit to supine: +2 for physical assistance, Max assist   General bed mobility comments: Assist to bring legs off of bed, elevate trunk into sitting, and bring hips to EOB. Assist to lower trunk and bring legs back up into bed.    Transfers Overall transfer level: Needs assistance Equipment used: Ambulation equipment used Transfers: Sit to/from Stand Sit to Stand: +2 physical assistance, Mod assist, Max assist, From elevated surface           General transfer comment: Used Stedy to stand from bed with +2 mod/max assist to bring hips up and for trunk control. Once sitting on Stedy seat pt leaning forward with trunk flexion and propping forearms on bar. Standing from [place] with +2 mod assist .    Ambulation/Gait               General Gait Details: unable   Stairs             Wheelchair Mobility    Modified Rankin (Stroke Patients Only) Modified Rankin (Stroke Patients Only) Pre-Morbid Rankin Score: No significant disability Modified Rankin: Severe disability     Balance Overall balance assessment: Needs assistance Sitting-balance support: Single extremity supported, Feet supported Sitting balance-Leahy Scale: Poor Sitting balance - Comments: UE support and min guard for static sitting Postural control: Posterior lean Standing balance support: Bilateral upper extremity supported Standing balance-Leahy Scale: Zero Standing balance comment: Stedy and +  2 mod/max assist                            Cognition Arousal/Alertness: Lethargic Behavior During Therapy: Flat affect Overall Cognitive Status:  Impaired/Different from baseline Area of Impairment: Attention, Following commands, Safety/judgement, Problem solving, Awareness                   Current Attention Level: Sustained   Following Commands: Follows one step commands inconsistently, Follows one step commands with increased time Safety/Judgement: Decreased awareness of safety, Decreased awareness of deficits Awareness: Intellectual Problem Solving: Slow processing, Decreased initiation, Difficulty sequencing, Requires verbal cues, Requires tactile cues General Comments: pt continues to be lethargic, arouses ti stimualtion and movement, contineus to intermittently close eyes during mobility        Exercises Other Exercises Other Exercises: STS in stedy frame x5    General Comments General comments (skin integrity, edema, etc.): pt mother-in-law present and supprtiv throughout      Pertinent Vitals/Pain Pain Assessment Pain Assessment: Faces Faces Pain Scale: Hurts a little bit Pain Location: general Pain Descriptors / Indicators: Guarding, Grimacing, Moaning Pain Intervention(s): Monitored during session, Limited activity within patient's tolerance    Home Living                          Prior Function            PT Goals (current goals can now be found in the care plan section) Acute Rehab PT Goals PT Goal Formulation: Patient unable to participate in goal setting Time For Goal Achievement: 07/22/22 Progress towards PT goals: Progressing toward goals    Frequency    Min 3X/week      PT Plan      Co-evaluation PT/OT/SLP Co-Evaluation/Treatment: Yes Reason for Co-Treatment: Complexity of the patient's impairments (multi-system involvement);For patient/therapist safety;To address functional/ADL transfers PT goals addressed during session: Mobility/safety with mobility;Proper use of DME;Balance        AM-PAC PT "6 Clicks" Mobility   Outcome Measure  Help needed turning from your  back to your side while in a flat bed without using bedrails?: A Lot Help needed moving from lying on your back to sitting on the side of a flat bed without using bedrails?: A Lot Help needed moving to and from a bed to a chair (including a wheelchair)?: Total Help needed standing up from a chair using your arms (e.g., wheelchair or bedside chair)?: Total Help needed to walk in hospital room?: Total Help needed climbing 3-5 steps with a railing? : Total 6 Click Score: 8    End of Session Equipment Utilized During Treatment: Gait belt Activity Tolerance: Patient limited by fatigue;Patient limited by lethargy Patient left: in bed;with call bell/phone within reach;with bed alarm set;with family/visitor present Nurse Communication: Mobility status PT Visit Diagnosis: Other abnormalities of gait and mobility (R26.89);Muscle weakness (generalized) (M62.81)     Time: 7357-8978 PT Time Calculation (min) (ACUTE ONLY): 23 min  Charges:  $Therapeutic Activity: 8-22 mins                     Cumi Sanagustin R. PTA Acute Rehabilitation Services Office: [place] 07/10/2022, 10:13 AM

## 2022-07-10 NOTE — Progress Notes (Signed)
PROGRESS NOTE    Joanna Reid   FUX:[id number] DOB: 08-06-1965  DOA: 06/18/2022 Date of Service: 07/10/22 PCP: Lindell Spar, MD  Brief Narrative / Interim history: 56 year old female with refractory multiple myeloma status post relapse, hypertension, gout, GERD, type 2 diabetes mellitus, migraine headaches, stroke with residual aphasia presented to ED for ongoing weakness and lethargy. Last had chemotherapy 2 weeks ago. She has recently received PRBC and platelets in the cancer center. While in the ED patient had acute mental status change, unarousable with full tonic-clonic seizure lasting 2 to 3 minutes which improved with lorazepam. Transferred to [facility] for further neuro evaluation and follow-up as well as continuous EEG.  She has been placed on multiple antiepileptics, now some AEDs have been discontinued or are being tapered down as below under guidance from neurology. Transitioned to oral phenobarbital, Vimpat, Keppra as of 12/23. Mental status continues to improve slowly - complicated by poor sleep cycle and likely concurrent hospital delirium.   Patient improving over the past 72h - tentatively planned for discharge home w/ HHPT/OT in the next 24-48h pending ongoing mental status improvements and equipment delivery at home.   Consultants:  Neurology  Oncology   Assesement and Plan:  Principal Problem:   Generalized weakness Active Problems:   Multiple myeloma (HCC)   Hyperlipidemia   Acute on chronic anemia   Leukopenia   Stroke (cerebrum) -Left parietal lobe, 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    Chronic pain disorder   LVH (left ventricular hypertrophy)   Morbid obesity with BMI of 45.0-49.9, adult (HCC)   Drug-induced polyneuropathy (HCC)   History of CVA with residual deficit   Overactive bladder   Subclinical hypothyroidism   Hypokalemia   Grand mal seizure ([place])   Hypomagnesemia   Status epilepticus (HCC)   New Onset Partial  Status Epilepticus in the setting of underlying CVA, acute metabolic encephalopathy  MRI on admission showed a 2 mm acute white matter infarct lateral to the right caudate head, and also tiny subacute infarcts in the anterior right corona radiata and left occipital lobe white matter.   EEG was concerning for possible seizures.   Workup with a 2D echo showed normal EF 65 to 02%, grade 1 diastolic dysfunction, bubble study was negative, A1c 7.2.  converted convert IV antiepileptics to p.o. -->  Transitioned to oral phenobarbital, Vimpat, Keppra as of 12/23.  Continue aspirin and statin for stroke.     Will need rehab SNF Outpatient plan: F/u with neuro in 3 months. May consider repeat MRI brain at that time if still has language deficits   Pt remains fairly somnolent/confused, neurology reassessing - sleep schedule improving on increased Seroquel dose, continue at this time Per Neuro -given negative EEG and improving mental status with improved sleep hygiene no indication for transitioning or changing antiepileptics at this time.  Abdominal pain  Secondary to nephrolithiasis(nonobstructive) Complicated by constipation, resolved Incidental liver lesions noted  Patient indicates her previous flank pain is consistent with her history of nephrolithiasis, CTA notable for nonobstructing nephrolithasis -per husband these are somewhat chronic.   Abdominal imaging incidentally showed multiple liver lesions, which will require an MRI as an outpatient.   Hypomagnesemia, hypokalemia  Continue to monitor PRN - replete as appropriate    Pancytopenia  Secondary to myeloma/treatment  Plt count stabilzing around 40/50, Hgb downtrending slowly -6.8 today 2u PRBC ordered today Received a total of 3 units of packed red blood cells previously given (Dec 11th,  18th, 20th)    Refractory Multiple Myeloma  Followed by Dr. Delton Coombes.  Last chemo given 2 weeks prior to admit  DVT prophylaxis: SCD Pertinent IV  fluids/nutrition: dysphagia 2 diet per SLP Central lines / invasive devices: R chest port   Code Status: FULL CODE   Disposition: inpatient -tentative discharge home with home health PT OT and likely nursing, family is wishing to take patient home rather than rehab at this point as they are able to provide care TOC needs: SNF placement canceled, transition home with home health PT OT nursing Barriers to discharge / significant pending items: placement, mentation improvement, neurology clearance for discharge, likely able to discharge in the next 24 to 48 hours assuming clinical course continues to improve.  Subjective:  No acute issues or events overnight, mentation continues to improve slowly, patient had some difficulty sleeping again overnight - will continue current regimen.  Family Communication: Husband at bedside, updated on plan  Objective Findings:  Vitals:   07/09/22 1547 07/09/22 1618 07/09/22 1811 07/09/22 2006  BP: 126/71 111/63 135/79 122/75  Pulse: 96 95 (!) 102 98  Resp: _0 Temp: 98.4 F (36.9 C) 99.4 F (37.4 C) 98.5 F (36.9 C) 98.5 F (36.9 C)  TempSrc: Oral Oral Oral Oral  SpO2: 99% 100% 100% 95%  Weight:      Height:        Intake/Output Summary (Last 24 hours) at 07/10/2022 0815 Last data filed at 07/09/2022 1811 Gross per 24 hour  Intake 1362.5 ml  Output 900 ml  Net 462.5 ml    Filed Weights   06/18/22 1728 06/19/22 0500 07/06/22 0600  Weight: 109.3 kg 109.4 kg 110.3 kg    Examination:   General:  Pleasantly resting in bed, No acute distress.  Awake alert, much less somnolent than prior, oriented to person and place time and situation but continues to have delayed speech HEENT:  Normocephalic atraumatic.  Sclerae nonicteric, noninjected.  Extraocular movements intact bilaterally. Neck:  Without mass or deformity.  Trachea is midline. Lungs:  Clear to auscultate bilaterally without rhonchi, wheeze, or rales. Heart:  Regular rate and  rhythm.  Without murmurs, rubs, or gallops. Abdomen:  Soft, nontender, nondistended.  Without guarding or rebound. Extremities: Without cyanosis, clubbing, scant bilateral lower extremity edema. Vascular:  Dorsalis pedis and posterior tibial pulses palpable bilaterally. Skin:  Warm and dry, no erythema, no ulcerations.  Scheduled Medications:   acyclovir  400 mg Oral BID   allopurinol  300 mg Oral Daily   aspirin  325 mg Oral Daily   Chlorhexidine Gluconate Cloth  6 each Topical Daily   escitalopram  10 mg Oral Daily   fluticasone  2 spray Each Nare Daily   influenza vac split quadrivalent PF  0.5 mL Intramuscular Tomorrow-1000   lacosamide  100 mg Oral BID   levETIRAcetam  1,000 mg Oral BID   magnesium chloride  1 tablet Oral Daily   melatonin  3 mg Oral QHS   pantoprazole  40 mg Oral Daily   pregabalin  200 mg Oral BID   QUEtiapine  25 mg Oral Daily   rosuvastatin  20 mg Oral Daily   sodium chloride flush  10-40 mL Intracatheter Q12H   traZODone  50 mg Oral QHS   PRN Medications:  acetaminophen **OR** acetaminophen, albuterol, bisacodyl, diclofenac Sodium, fentaNYL (SUBLIMAZE) injection, LORazepam, ondansetron **OR** ondansetron (ZOFRAN) IV, oxyCODONE, polyvinyl alcohol, sodium chloride flush, sodium phosphate  Antimicrobials:  Anti-infectives (From admission, onward)  Start     Dose/Rate Route Frequency Ordered Stop   06/19/22 1200  vancomycin (VANCOREADY) IVPB 1500 mg/300 mL  Status:  Discontinued        1,500 mg 150 mL/hr over 120 Minutes Intravenous Every 24 hours 06/18/22 1152 06/18/22 1514   06/18/22 2200  acyclovir (ZOVIRAX) tablet 400 mg       Note to Pharmacy: TAKE (1) TABLET BY MOUTH TWICE DAILY.     400 mg Oral 2 times daily 06/18/22 1514     06/18/22 2000  ceFEPIme (MAXIPIME) 2 g in sodium chloride 0.9 % 100 mL IVPB  Status:  Discontinued        2 g 200 mL/hr over 30 Minutes Intravenous Every 8 hours 06/18/22 1152 06/18/22 1514   06/18/22 1200  vancomycin  (VANCOREADY) IVPB 2000 mg/400 mL        2,000 mg 200 mL/hr over 120 Minutes Intravenous  Once 06/18/22 1149 06/18/22 1451   06/18/22 1145  ceFEPIme (MAXIPIME) 2 g in sodium chloride 0.9 % 100 mL IVPB        2 g 200 mL/hr over 30 Minutes Intravenous  Once 06/18/22 1144 06/18/22 1304   06/18/22 1145  metroNIDAZOLE (FLAGYL) IVPB 500 mg        500 mg 100 mL/hr over 60 Minutes Intravenous  Once 06/18/22 1144 06/18/22 1431   06/18/22 1145  vancomycin (VANCOCIN) IVPB 1000 mg/200 mL premix  Status:  Discontinued        1,000 mg 200 mL/hr over 60 Minutes Intravenous  Once 06/18/22 1144 06/18/22 1149       Data Reviewed: I have personally reviewed following labs and imaging studies  CBC: Recent Labs  Lab 07/03/22 0847 07/06/22 0620 07/09/22 0610  WBC 5.6 5.2 5.5  HGB 8.2* 7.0* 6.8*  HCT 25.1* 20.4* 20.1*  MCV 91.9 90.7 91.4  PLT 58* 47* 44*    Basic Metabolic Panel: Recent Labs  Lab 07/03/22 0847 07/06/22 0620 07/09/22 0610  NA 130* 132* 136  K 4.1 3.2* 3.2*  CL 99 101 101  CO2 _0 GLUCOSE 118* 138* 91  BUN _1 CREATININE 0.57 0.71 0.70  CALCIUM 8.0* 7.7* 7.7*    Most Recent Urinalysis On File:     Component Value Date/Time   COLORURINE YELLOW 06/18/2022 1342   APPEARANCEUR CLOUDY (A) 06/18/2022 1342   APPEARANCEUR Cloudy (A) 05/02/2022 1113   LABSPEC 1.028 06/18/2022 1342   PHURINE 5.0 06/18/2022 1342   GLUCOSEU NEGATIVE 06/18/2022 1342   HGBUR NEGATIVE 06/18/2022 1342   BILIRUBINUR NEGATIVE 06/18/2022 1342   BILIRUBINUR Negative 05/02/2022 1113   KETONESUR 5 (A) 06/18/2022 1342   PROTEINUR 30 (A) 06/18/2022 1342   UROBILINOGEN 0.2 11/27/2021 1534   UROBILINOGEN 0.2 05/07/2012 0829   NITRITE NEGATIVE 06/18/2022 1342   LEUKOCYTESUR NEGATIVE 06/18/2022 1342     LOS: 22 days   Little Ishikawa, DO [facility] 07/10/2022, 8:15 AM   If 7PM-7AM, please contact night coverage [email or web address]

## 2022-07-10 NOTE — Progress Notes (Signed)
Occupational Therapy Treatment Patient Details Name: Joanna Reid MRN: [id number] DOB: August 06, 1965 Today's Date: 07/10/2022   History of present illness 57 yo female presents to [facility] on 12/11 with headache and lethargy. + seizures in ED. MRI brain shows small incidental stroke in the right caudate. EEG shows Continuous L lateralized periodic discharges (high risk for seizures). Pt with multiple myeloma currently undergoing treatment last chemo 2 weeks ago.  Other PMH includes hypertension, gout, GERD, type 2 diabetes mellitus, migraine headaches, stroke with residual aphasia   OT comments  Pt progressing towards goals this session, more alert/interactive than previous session. Pt needing max A for LB ADL, max A +2 for bed mobility and sit to stand transfers using Stedy. Pt propping self up on forearms during session, fatigued. Pt presenting with impairments listed below, will follow acutely. Continue to recommend AIR at d/c.   Recommendations for follow up therapy are one component of a multi-disciplinary discharge planning process, led by the attending physician.  Recommendations may be updated based on patient status, additional functional criteria and insurance authorization.    Follow Up Recommendations  Acute inpatient rehab (3hours/day)     Assistance Recommended at Discharge Frequent or constant Supervision/Assistance  Patient can return home with the following  Two people to help with walking and/or transfers;Two people to help with bathing/dressing/bathroom;Assistance with feeding;Assistance with cooking/housework;Direct supervision/assist for medications management;Direct supervision/assist for financial management;Assist for transportation;Help with stairs or ramp for entrance   Equipment Recommendations  BSC/3in1;Tub/shower bench;Wheelchair (measurements OT);Wheelchair cushion (measurements OT);Hospital bed;Other (comment) (hoyer lift)    Recommendations for Other Services Rehab  consult    Precautions / Restrictions Precautions Precautions: Fall Restrictions Weight Bearing Restrictions: No       Mobility Bed Mobility Overal bed mobility: Needs Assistance Bed Mobility: Rolling, Supine to Sit, Sit to Supine Rolling: Max assist   Supine to sit: Max assist, HOB elevated Sit to supine: +2 for physical assistance, Max assist   General bed mobility comments: Assist to bring legs off of bed, elevate trunk into sitting, and bring hips to EOB. Assist to lower trunk and bring legs back up into bed.    Transfers Overall transfer level: Needs assistance Equipment used: Ambulation equipment used Transfers: Sit to/from Stand Sit to Stand: +2 physical assistance, Mod assist, Max assist, From elevated surface           General transfer comment: Used Stedy to stand from bed with +2 mod/max assist to bring hips up and for trunk control. Once sitting on Stedy seat pt leaning forward with trunk flexion and propping forearms on bar. Standing from [place] with +2 mod assist .     Balance Overall balance assessment: Needs assistance Sitting-balance support: Single extremity supported, Feet supported Sitting balance-Leahy Scale: Poor Sitting balance - Comments: UE support and min guard for static sitting Postural control: Posterior lean Standing balance support: Bilateral upper extremity supported Standing balance-Leahy Scale: Zero Standing balance comment: Stedy and +2 mod/max assist                           ADL either performed or assessed with clinical judgement   ADL Overall ADL's : Needs assistance/impaired                     Lower Body Dressing: Maximal assistance;Sitting/lateral leans Lower Body Dressing Details (indicate cue type and reason): donning brief in sitting/standing  Functional mobility during ADLs: Maximal assistance;+2 for physical assistance      Extremity/Trunk Assessment Upper Extremity  Assessment Upper Extremity Assessment: Generalized weakness   Lower Extremity Assessment Lower Extremity Assessment: Defer to PT evaluation        Vision   Vision Assessment?: Vision impaired- to be further tested in functional context Additional Comments: needs further assessment   Perception Perception Perception: Not tested   Praxis Praxis Praxis: Not tested    Cognition Arousal/Alertness: Lethargic Behavior During Therapy: Flat affect Overall Cognitive Status: Impaired/Different from baseline Area of Impairment: Attention, Following commands, Safety/judgement, Problem solving, Awareness                   Current Attention Level: Sustained   Following Commands: Follows one step commands inconsistently, Follows one step commands with increased time Safety/Judgement: Decreased awareness of safety, Decreased awareness of deficits Awareness: Intellectual Problem Solving: Slow processing, Decreased initiation, Difficulty sequencing, Requires verbal cues, Requires tactile cues General Comments: closes eyes frequently during session, follows commands with increased cuing/assist        Exercises      Shoulder Instructions       General Comments family present and supportive throughout session    Pertinent Vitals/ Pain       Pain Assessment Pain Assessment: Faces Pain Score: 2  Faces Pain Scale: Hurts a little bit Pain Location: general Pain Descriptors / Indicators: Guarding, Grimacing, Moaning Pain Intervention(s): Limited activity within patient's tolerance, Monitored during session, Repositioned  Home Living                                          Prior Functioning/Environment              Frequency  Min 2X/week        Progress Toward Goals  OT Goals(current goals can now be found in the care plan section)  Progress towards OT goals: Progressing toward goals  Acute Rehab OT Goals Patient Stated Goal: none stated OT  Goal Formulation: With patient Time For Goal Achievement: 08/05/2022 Potential to Achieve Goals: Good ADL Goals Pt Will Perform Grooming: sitting;with set-up Pt Will Perform Upper Body Dressing: with supervision;sitting Pt Will Perform Lower Body Dressing: with max assist;sit to/from stand Pt Will Transfer to Toilet: with mod assist;with +2 assist;bedside commode;stand pivot transfer Additional ADL Goal #1: Pt will perform bed mobility with supervision as precursor to ADL. Additional ADL Goal #2: Pt will follow all one step commands with 1-2 verbal cues.  Plan Discharge plan remains appropriate;Frequency remains appropriate    Co-evaluation    PT/OT/SLP Co-Evaluation/Treatment: Yes Reason for Co-Treatment: Complexity of the patient's impairments (multi-system involvement);Necessary to address cognition/behavior during functional activity;To address functional/ADL transfers PT goals addressed during session: Mobility/safety with mobility;Proper use of DME;Balance OT goals addressed during session: ADL's and self-care;Strengthening/ROM      AM-PAC OT "6 Clicks" Daily Activity     Outcome Measure   Help from another person eating meals?: A Lot Help from another person taking care of personal grooming?: A Lot Help from another person toileting, which includes using toliet, bedpan, or urinal?: Total Help from another person bathing (including washing, rinsing, drying)?: A Lot Help from another person to put on and taking off regular upper body clothing?: A Lot Help from another person to put on and taking off regular lower body clothing?: Total 6 Click Score: 10  End of Session Equipment Utilized During Treatment: Gait belt;Other (comment) (stedy)  OT Visit Diagnosis: Unsteadiness on feet (R26.81);Muscle weakness (generalized) (M62.81);Other symptoms and signs involving cognitive function;Cognitive communication deficit (R41.841);Pain   Activity Tolerance Patient limited by  lethargy   Patient Left in bed;with call bell/phone within reach;with bed alarm set;with family/visitor present   Nurse Communication Mobility status        Time: 9476-5465 OT Time Calculation (min): 23 min  Charges: OT General Charges $OT Visit: 1 Visit OT Treatments $Self Care/Home Management : 8-22 mins  Renaye Rakers, OTD, OTR/L SecureChat Preferred Acute Rehab [phone] - [place] 07/10/2022, 12:26 PM

## 2022-07-11 DIAGNOSIS — C9000 Multiple myeloma not having achieved remission: Secondary | ICD-10-CM | POA: Diagnosis not present

## 2022-07-11 LAB — CBC
HCT: 23 % — ABNORMAL LOW (ref 36.0–46.0)
Hemoglobin: 8.1 g/dL — ABNORMAL LOW (ref 12.0–15.0)
MCH: 31.5 pg (ref 26.0–34.0)
MCHC: 35.2 g/dL (ref 30.0–36.0)
MCV: 89.5 fL (ref 80.0–100.0)
Platelets: 32 10*3/uL — ABNORMAL LOW (ref 150–400)
RBC: 2.57 MIL/uL — ABNORMAL LOW (ref 3.87–5.11)
RDW: 19.1 % — ABNORMAL HIGH (ref 11.5–15.5)
WBC: 5.1 10*3/uL (ref 4.0–10.5)
nRBC: 0.6 % — ABNORMAL HIGH (ref 0.0–0.2)

## 2022-07-11 LAB — BASIC METABOLIC PANEL
Anion gap: 11 (ref 5–15)
BUN: 13 mg/dL (ref 6–20)
CO2: 26 mmol/L (ref 22–32)
Calcium: 7.9 mg/dL — ABNORMAL LOW (ref 8.9–10.3)
Chloride: 98 mmol/L (ref 98–111)
Creatinine, Ser: 0.69 mg/dL (ref 0.44–1.00)
GFR, Estimated: >60 mL/min (ref >60)
Glucose, Bld: 75 mg/dL (ref 70–99)
Potassium: 3.5 mmol/L (ref 3.5–5.1)
Sodium: 135 mmol/L (ref 135–145)

## 2022-07-11 MED ORDER — HALOPERIDOL 0.5 MG PO TABS
1.0000 mg | ORAL_TABLET | Freq: Once | ORAL | Status: AC
  Administered 2022-07-11: 1 mg via ORAL
  Filled 2022-07-11: qty 2

## 2022-07-11 MED ORDER — QUETIAPINE FUMARATE 25 MG PO TABS: 25.0000 mg | ORAL_TABLET | Freq: Every day | ORAL | 1 refills | Status: AC

## 2022-07-11 MED ORDER — TRAZODONE HCL 50 MG PO TABS: 50.0000 mg | ORAL_TABLET | Freq: Every day | ORAL | 1 refills | Status: AC

## 2022-07-11 MED ORDER — LEVETIRACETAM 1000 MG PO TABS: 1000.0000 mg | ORAL_TABLET | Freq: Two times a day (BID) | ORAL | 2 refills | Status: AC

## 2022-07-11 MED ORDER — MELATONIN 3 MG PO TABS: 3.0000 mg | ORAL_TABLET | Freq: Every evening | ORAL | 0 refills | Status: AC | PRN

## 2022-07-11 MED ORDER — CARVEDILOL 6.25 MG PO TABS
6.2500 mg | ORAL_TABLET | Freq: Two times a day (BID) | ORAL | Status: DC
  Administered 2022-07-11: 6.25 mg via ORAL
  Filled 2022-07-11: qty 1

## 2022-07-11 MED ORDER — LACOSAMIDE 100 MG PO TABS: 100.0000 mg | ORAL_TABLET | Freq: Two times a day (BID) | ORAL | 2 refills | Status: AC

## 2022-07-11 MED ORDER — HEPARIN SOD (PORK) LOCK FLUSH 100 UNIT/ML IV SOLN
500.0000 [IU] | INTRAVENOUS | Status: AC | PRN
  Administered 2022-07-11: 500 [IU]
  Filled 2022-07-11: qty 5

## 2022-07-11 MED ORDER — CARVEDILOL 3.125 MG PO TABS: 3.1250 mg | ORAL_TABLET | Freq: Two times a day (BID) | ORAL | 1 refills | Status: AC

## 2022-07-11 NOTE — TOC Transition Note (Signed)
Transition of Care [facility]) - CM/SW Discharge Note   Patient Details  Name: Joanna Reid MRN: [id number] Date of Birth: [birth year]  Transition of Care [facility]) CM/SW Contact:  Pollie Friar, RN Phone Number: 07/11/2022, 1:00 PM   Clinical Narrative:    Pt is discharging home with home health services through [facility]. Information on the AVS.  [employer] to deliver DME to the home by 4 pm today. Spouse is aware. Discharge medications sent to pts regular pharmacy and spouse to pick them up.  Spouse in agreement for PTAR home. CM has verified home address and arranged PTAR. Bedside RN updated.   Final next level of care: Home w Home Health Services Barriers to Discharge: No Barriers Identified   Patient Goals and CMS Choice CMS Medicare.gov Compare Post Acute Care list provided to:: Patient Represenative (must comment) Choice offered to / list presented to : Spouse  Discharge Placement                         Discharge Plan and Services Additional resources added to the After Visit Summary for   In-house Referral: Clinical Social Work Discharge Planning Services: CM Consult Post Acute Care Choice: Home Health, Durable Medical Equipment          DME Arranged: Youth worker wheelchair with seat cushion, Hospital bed, Other see comment (hoyer lift) DME Agency: [facility] [employer] Date DME Agency Contacted: 07/10/22   Representative spoke with at DME Agency: [facility] HH Arranged: RN, PT, OT, Nurse's Aide [place] Agency: [place] Date [place]: 07/10/22   Representative spoke with at [place]: [place] Interventions SDOH Screenings   Food Insecurity: No Food Insecurity (06/19/2022)  Housing: Low Risk  (06/19/2022)  Transportation Needs: No Transportation Needs (06/19/2022)  Utilities: Not At Risk (06/19/2022)  Alcohol Screen: Low Risk  (08/08/2021)  Depression (PHQ2-9): Low Risk  (04/19/2022)  Financial Resource  Strain: Low Risk  (08/08/2021)  Physical Activity: Unknown (08/08/2021)  Social Connections: Socially Integrated (08/08/2021)  Stress: No Stress Concern Present (08/08/2021)  Tobacco Use: Medium Risk (06/18/2022)     Readmission Risk Interventions     No data to display

## 2022-07-11 NOTE — Discharge Summary (Signed)
[facility]  Physician Discharge Summary   Patient ID: Joanna Reid MRN: [id number] DOB/AGE: [birth year] 57 y.o.  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 Last had chemotherapy 2 weeks ago. She has recently received PRBC and platelets in the cancer center. While in the ED patient had acute mental status change, unarousable with full tonic-clonic seizure lasting 2 to 3 minutes which improved with lorazepam. Transferred to [place] for further neuro evaluation and follow-up as well as continuous EEG.  She has been placed on multiple antiepileptics, now some AEDs have been discontinued or are being tapered down as below under guidance from neurology. Transitioned to oral phenobarbital, Vimpat, Keppra as of 12/23. Mental status continues to improve slowly - complicated by poor sleep cycle and likely concurrent hospital delirium.    Patient improving over  the past 72h - tentatively planned for discharge home w/ HHPT/OT in the next 24-48h pending ongoing mental status improvements and equipment delivery at home.  Consultations: Neurology Oncology    HOSPITAL COURSE:   New Onset Partial Status Epilepticus in the setting of underlying CVA, acute metabolic encephalopathy  MRI on admission showed a 2 mm acute white matter infarct lateral to the right caudate head, and also tiny subacute infarcts in the anterior right corona radiata and left occipital lobe white matter.   EEG was concerning for possible seizures.   Workup with a 2D echo showed normal EF 65 to 14%, grade 1 diastolic dysfunction, bubble study was negative, A1c 7.2.  converted convert IV antiepileptics to p.o. -->  Transitioned to oral phenobarbital, Vimpat, Keppra as of 12/23.  Continue aspirin and statin for stroke.     Outpatient plan: F/u with neuro in 3 months. May consider repeat MRI brain at that time if still has language deficits   Per Neuro -given negative EEG and improving mental status with improved sleep hygiene no indication for transitioning or changing antiepileptics at this time.   Abdominal pain  Secondary to nephrolithiasis(nonobstructive) Complicated by constipation, resolved Incidental liver lesions noted  Patient indicates her previous flank pain is consistent with her history of nephrolithiasis, CTA notable for nonobstructing nephrolithasis -per husband these are somewhat chronic.   Abdominal imaging incidentally showed multiple liver lesions, which will require an MRI as an outpatient.   Hypomagnesemia, hypokalemia   Pancytopenia  Secondary to myeloma/treatment  Received a total of 3 units of packed red blood cells previously given (Dec 11th, 18th, 20th)     Refractory Multiple Myeloma  Followed by Joanna Reid.  Last chemo given 2 weeks prior to admit  Obesity Estimated body mass index is 41.74 kg/m as calculated from the following:   Height as  of this encounter: 5' 4" (1.626 m).   Weight as of this encounter: 110.3 kg.  Patient is stable.  Patient's family would prefer for her to come home rather than go to skilled nursing facility.    Noted to be tachycardic this morning.  Likely rebound tachycardia from not being on carvedilol which she usually takes at home.  This was resumed with improvement in heart rate.    Okay for discharge.    PERTINENT LABS:  The results of significant diagnostics from this hospitalization (including imaging, microbiology, ancillary and laboratory) are listed below for reference.    Labs:   Basic Metabolic Panel: Recent Labs  Lab 07/06/22 0620 07/09/22 0610 07/11/22 0500  NA 132* 136 135  K 3.2* 3.2* 3.5  CL 101 101 98  CO2 _0 GLUCOSE 138* 91 75  BUN _1 CREATININE 0.71 0.70 0.69  CALCIUM 7.7* 7.7* 7.9*    CBC: Recent Labs  Lab 07/06/22 0620 07/09/22 0610 07/10/22 1045 07/11/22 0500  WBC 5.2 5.5  --  5.1  HGB 7.0* 6.8* 8.8* 8.1*  HCT 20.4* 20.1* 25.6* 23.0*  MCV 90.7 91.4  --  89.5  PLT 47* 44*  --  32*     IMAGING STUDIES Overnight EEG with video  Result Date: 07/07/2022 Joanna Havens, MD     07/07/2022  6:51 AM Patient Name: Joanna Reid MRN: [id number] Epilepsy Attending: [name] Referring Physician/Provider: [name], MD Duration: 07/06/2022 1545 to 07/07/2022 7342  Patient history:  57 y.o. female with a history of seizure who is undergoing an EEG to evaluate for seizures.  Level of alertness: Awake, sleep  AEDs during EEG study: Phenobarb, PGB, LEV, LCM  Technical aspects: This EEG study was done with scalp electrodes positioned according to the 10-20 International system of electrode placement. Electrical activity was reviewed with band pass filter of 1-70Hz, sensitivity of 7 uV/mm, display speed of 74m/sec with a 60Hz notched filter applied as appropriate. EEG data were recorded continuously and digitally stored.  Video monitoring  was available and reviewed as appropriate.  Description: No clear posterior dominant rhythm was seen. Sleep was characterized by sleep spindles (12 to 14 Hz), maximal frontocentral region. EEG showed continuous generalized 3 to 6 Hz theta-delta slowing. Hyperventilation and photic stimulation were not performed.   ABNORMALITY - Continuous slow, generalized  IMPRESSION: This study is suggestive of moderate to severe diffuse encephalopathy, nonspecific etiology. No seizures  were seen throughout the recording.   PLora Reid  CT Angio Abd/Pel w/ and/or w/o  Result Date: 06/24/2022 CLINICAL DATA:  57year old female with history of epigastric discomfort. History of multiple myeloma. * Tracking Code: BO * EXAM: CTA ABDOMEN AND PELVIS WITHOUT AND WITH CONTRAST TECHNIQUE: Multidetector CT imaging of the abdomen and pelvis was performed using the standard protocol during bolus administration of intravenous contrast. Multiplanar reconstructed images and MIPs were obtained and reviewed to evaluate the vascular anatomy. RADIATION DOSE REDUCTION: This exam was performed according to the departmental dose-optimization program which includes automated exposure control, adjustment of the mA and/or kV according to patient size and/or use of iterative reconstruction technique. CONTRAST:  757mOMNIPAQUE IOHEXOL 350 MG/ML SOLN COMPARISON:  CT of the abdomen and pelvis 04/30/2022. FINDINGS: Comment: Portions of today's examination  are limited by considerable patient motion. VASCULAR Aorta: Atherosclerosis throughout the abdominal aorta. Normal caliber aorta without aneurysm, dissection, vasculitis or significant stenosis. Celiac: Patent without evidence of aneurysm, dissection, vasculitis or significant stenosis. SMA: Patent without evidence of aneurysm, dissection, vasculitis or significant stenosis. Renals: Both renal arteries are patent without evidence of aneurysm, dissection, vasculitis, fibromuscular dysplasia or  significant stenosis. IMA: Patent without evidence of aneurysm, dissection, vasculitis or significant stenosis. Inflow: Patent without evidence of aneurysm, dissection, vasculitis or significant stenosis. Proximal Outflow: Bilateral common femoral and visualized portions of the superficial and profunda femoral arteries are patent without evidence of aneurysm, dissection, vasculitis or significant stenosis. Veins: No obvious venous abnormality within the limitations of this arterial phase study. Review of the MIP images confirms the above findings. NON-VASCULAR Lower chest: Scattered areas of scarring are noted throughout the lung bases bilaterally. Hepatobiliary: Severe diffuse low attenuation throughout the hepatic parenchyma, indicative of a background of severe hepatic steatosis. There are multiple nodular appearing areas within the liver which appear spared from hepatic steatosis on precontrast images. Assessment for enhancement within these lesions is challenging on today's limited CT examination, and accordingly, these lesions are incompletely characterized, largest of which is in the right lobe of the liver (axial image 25 of series 11) measuring 1.7 cm in diameter. No intra or extrahepatic biliary ductal dilatation. Gallbladder is moderately distended. Gallbladder wall thickness is normal. No pericholecystic fluid or surrounding inflammatory changes. No calcified gallstones are noted in the lumen of the gallbladder. Pancreas: No pancreatic mass. No pancreatic ductal dilatation. No pancreatic or peripancreatic fluid collections or inflammatory changes. Spleen: Unremarkable. Adrenals/Urinary Tract: 6 mm nonobstructive calculus in the right renal collecting system. No additional calculi are noted within the left renal collecting system, along the course of either ureter, or within the lumen of the urinary bladder. Bilateral kidneys and adrenal glands are normal in appearance. No hydroureteronephrosis. Urinary  bladder is nearly decompressed around an indwelling Foley balloon catheter. Small amount of gas non dependently within the lumen of the urinary bladder is iatrogenic. Stomach/Bowel: The appearance of the stomach is normal. No pathologic dilatation of small bowel or colon. Normal appendix. Lymphatic: No lymphadenopathy noted in the abdomen or pelvis. Reproductive: Status post hysterectomy.  Ovaries are atrophic. Other: No significant volume of ascites.  No pneumoperitoneum. Musculoskeletal: Areas of mixed lucency and sclerosis are noted throughout the visualized axial and appendicular skeleton, likely reflective of numerous osseous lesions from patient's known multiple myeloma. IMPRESSION: VASCULAR 1. No acute vascular abnormality in the abdomen or pelvis. 2. Aortic atherosclerosis. NON-VASCULAR 1. Severe hepatic steatosis. Multiple liver lesions poorly evaluated on today's CT examination, favored to represent areas of focal fatty sparing, however, underlying neoplasm is not excluded. Follow-up evaluation with nonemergent outpatient abdominal MRI with and without IV gadolinium is recommended in the near future to characterize these lesions and exclude neoplasm. 2. 6 mm nonobstructive calculus in the right renal collecting system. No ureteral stones or findings of urinary tract obstruction. 3. Multiple osseous lesions, presumably reflective of patient's known multiple myeloma. 4. Additional incidental findings, as above. These results will be called to the ordering clinician or representative by the Radiologist Assistant, and communication documented in the PACS or [employer]. Electronically Signed   By: Vinnie Langton M.D.   On: 06/24/2022 07:48   Overnight EEG with video  Result Date: 06/20/2022 Derek Jack, MD     06/21/2022  7:17 AM Patient Name: Joanna Reid Epilepsy Attending: Su Monks MD Referring Physician/Provider: Dr.  Kerney Elbe Duration: 4401 on 06/19/22 to 1701 on 06/20/22   Patient history:  CASHMERE DINGLEY is a 57 y.o. female with a history of seizure who is undergoing an EEG to evaluate for seizures.  Level of alertness: awake and asleep  Technical aspects: This EEG study was done with scalp electrodes positioned according to the 10-20 International system of electrode placement. Electrical activity was reviewed with band pass filter of 1-70Hz, sensitivity of 7 uV/mm, display speed of 39m/sec with a 60Hz notched filter applied as appropriate. EEG data were recorded continuously and digitally stored.  Video monitoring was available and reviewed as appropriate.  Description: The best background was 4-5 Hz. This activity is reactive to stimulation. Drowsiness was manifested by background fragmentation; deeper stages of sleep were identified by K complexes and sleep spindles. There was focal slowing over the left hemisphere. Throughout the recording patient had continuous L sided lateralized periodic discharges at 0.5-1.0 Hz with no sustained evolution. There were no definitive electrographic seizures during this recording. Photic stimulation and hyperventilation were not performed. Patient event button was [facility]. There was no change in background before, during, or after the event.   ABNORMALITY & CLINICAL CORRELATION - continuous L sided lateralized periodic discharges at 0.5-1.0 Hz with no sustained evolution therefore not meeting criteria for electrographic seizure. Thes pattern is however high on the ictal-interictal continuum with high risk for seizure. - moderate diffuse slowing indicative of global cerebral dysfunction - left focal slowing indicative of focal cerebral dysfunction in that region Neurology will be notified of these findings. Recommend continuation of LTM. CSu Monks MD [facility] [phone] 7pm- 7am, please page neurology on call as listed in [place]    EEG adult  Result Date: 06/20/2022 SDerek Jack MD      06/20/2022  7:03 AM Routine EEG Report Joanna KAMPis a 57y.o. female with a history of seizure who is undergoing an EEG to evaluate for seizures. Report: This EEG was acquired with electrodes placed according to the International 10-20 electrode system (including Fp1, Fp2, F3, F4, C3, C4, P3, P4, O1, O2, T3, T4, T5, T6, A1, A2, Fz, Cz, Pz). The following electrodes were missing or displaced: none. The best background was 6 Hz with superimposed L focal slowing. This activity is reactive to stimulation. Drowsiness was manifested by background fragmentation; deeper stages of sleep were not identified. There were continuous L LPDs, at times 0.5-1 Hz, and at times accelerating to 2-3 Hz with subtle evolution concerning for possible seizure (2-3 times in this routine recording). Photic stimulation and hyperventilation were not performed. Impression and clinical correlation: This EEG was obtained while awake and drowsy and is abnormal due to: - moderate diffuse slowing indicative of global cerebral dysfunction - Continuous L lateralized periodic discharges, at times 0.5-1 Hz, and at times accelerating to 2-3 Hz with subtle evolution concerning for possible seizure. This pattern is on the ictal-interictal continuum with high risk for seizures. Prolonged EEG for further characterization is recommended. CSu Monks MD [facility] [phone] 7pm- 7am, please page neurology on call as listed in [place]   ECHOCARDIOGRAM COMPLETE BUBBLE STUDY  Result Date: 06/19/2022    ECHOCARDIOGRAM REPORT   Patient Name:   Joanna GHEENDate of Exam: 06/19/2022 Medical Rec #:  [id number]       Height:       64.0 in Accession #:    [id number]      Weight:  241.2 lb Date of Birth:  09-16-1965        BSA:          2.118 m Patient Age:    25 years         BP:           161/92 mmHg Patient Gender: F                HR:           86 bpm. Exam Location:  [facility] Procedure: 2D Echo, Cardiac Doppler, Color  Doppler and Saline Contrast Bubble            Study Indications:    Stroke  History:        Patient has prior history of Echocardiogram examinations, most                 recent 01/16/2022. Stroke; Risk Factors:Hypertension, Diabetes,                 Dyslipidemia and Former Smoker. Multiple myleoma, Port-a-cath in                 place.  Sonographer:    Wenda Low Referring Phys: [id number] JAN A MANSY  Sonographer Comments: Patient is obese. IMPRESSIONS  1. Left ventricular ejection fraction, by estimation, is 60 to 65%. The left ventricle has normal function. The left ventricle has no regional wall motion abnormalities. There is moderate asymmetric left ventricular hypertrophy of the septal segment. Left ventricular diastolic parameters are consistent with Grade I diastolic dysfunction (impaired relaxation).  2. Right ventricular systolic function is normal. The right ventricular size is normal. There is normal pulmonary artery systolic pressure. The estimated right ventricular systolic pressure is 02.6 mmHg.  3. The mitral valve is grossly normal. Trivial mitral valve regurgitation.  4. The aortic valve is tricuspid. Aortic valve regurgitation is mild. Aortic regurgitation PHT measures 603 msec.  5. Aortic dilatation noted. There is mild dilatation of the ascending aorta, measuring 41 mm.  6. The inferior vena cava is normal in size with <50% respiratory variability, suggesting right atrial pressure of 8 mmHg.  7. Agitated saline contrast bubble study was negative, with no evidence of right to left interatrial shunt. Comparison(s): Prior images reviewed side by side. LVEF remains normal range at 60-65%. Mildly dilated ascending aorta with mild aortic regurgitation. FINDINGS  Left Ventricle: Left ventricular ejection fraction, by estimation, is 60 to 65%. The left ventricle has normal function. The left ventricle has no regional wall motion abnormalities. The left ventricular internal cavity size was normal in  size. There is  moderate asymmetric left ventricular hypertrophy of the septal segment. Left ventricular diastolic parameters are consistent with Grade I diastolic dysfunction (impaired relaxation). Right Ventricle: The right ventricular size is normal. No increase in right ventricular wall thickness. Right ventricular systolic function is normal. There is normal pulmonary artery systolic pressure. The tricuspid regurgitant velocity is 2.20 m/s, and  with an assumed right atrial pressure of 8 mmHg, the estimated right ventricular systolic pressure is 37.8 mmHg. Left Atrium: Left atrial size was normal in size. Right Atrium: Right atrial size was normal in size. Pericardium: There is no evidence of pericardial effusion. Mitral Valve: The mitral valve is grossly normal. Trivial mitral valve regurgitation. MV peak gradient, 4.1 mmHg. The mean mitral valve gradient is 2.0 mmHg. Tricuspid Valve: The tricuspid valve is grossly normal. Tricuspid valve regurgitation is trivial. Aortic Valve: The aortic valve is tricuspid. Aortic valve regurgitation  is mild. Aortic regurgitation PHT measures 603 msec. Aortic valve mean gradient measures 5.0 mmHg. Aortic valve peak gradient measures 12.4 mmHg. Aortic valve area, by VTI measures 2.95 cm. Pulmonic Valve: The pulmonic valve was grossly normal. Pulmonic valve regurgitation is trivial. Aorta: The aortic root is normal in size and structure and aortic dilatation noted. There is mild dilatation of the ascending aorta, measuring 41 mm. Venous: The inferior vena cava is normal in size with less than 50% respiratory variability, suggesting right atrial pressure of 8 mmHg. IAS/Shunts: No atrial level shunt detected by color flow Doppler. Agitated saline contrast was given intravenously to evaluate for intracardiac shunting. Agitated saline contrast bubble study was negative, with no evidence of any interatrial shunt.  LEFT VENTRICLE PLAX 2D LVIDd:         5.20 cm   Diastology LVIDs:          3.30 cm   LV e' medial:    5.22 cm/s LV PW:         1.30 cm   LV E/e' medial:  15.2 LV IVS:        1.50 cm   LV e' lateral:   7.62 cm/s LVOT diam:     2.10 cm   LV E/e' lateral: 10.4 LV SV:         93 LV SV Index:   44 LVOT Area:     3.46 cm  RIGHT VENTRICLE RV Basal diam:  2.70 cm RV Mid diam:    2.20 cm RV S prime:     14.80 cm/s LEFT ATRIUM             Index        RIGHT ATRIUM           Index LA diam:        4.30 cm 2.03 cm/m   RA Area:     13.20 cm LA Vol (A2C):   62.0 ml 29.27 ml/m  RA Volume:   24.40 ml  11.52 ml/m LA Vol (A4C):   46.4 ml 21.90 ml/m LA Biplane Vol: 54.2 ml 25.59 ml/m  AORTIC VALVE                    PULMONIC VALVE AV Area (Vmax):    2.81 cm     PV Vmax:       1.29 m/s AV Area (Vmean):   3.00 cm     PV Peak grad:  6.7 mmHg AV Area (VTI):     2.95 cm AV Vmax:           176.00 cm/s AV Vmean:          95.700 cm/s AV VTI:            0.315 m AV Peak Grad:      12.4 mmHg AV Mean Grad:      5.0 mmHg LVOT Vmax:         143.00 cm/s LVOT Vmean:        83.000 cm/s LVOT VTI:          0.268 m LVOT/AV VTI ratio: 0.85 AI PHT:            603 msec  AORTA Ao Root diam: 3.70 cm Ao Asc diam:  4.10 cm MITRAL VALVE                TRICUSPID VALVE MV Area (PHT): 5.06 cm     TR Peak grad:   19.4 mmHg  MV Area VTI:   3.14 cm     TR Vmax:        220.00 cm/s MV Peak grad:  4.1 mmHg MV Mean grad:  2.0 mmHg     SHUNTS MV Vmax:       1.01 m/s     Systemic VTI:  0.27 m MV Vmean:      69.0 cm/s    Systemic Diam: 2.10 cm MV Decel Time: 150 msec MV E velocity: 79.50 cm/s MV A velocity: 114.00 cm/s MV E/A ratio:  0.70 Rozann Lesches MD Electronically signed by Rozann Lesches MD Signature Date/Time: 06/19/2022/10:34:16 AM    Final    MR BRAIN W WO CONTRAST  Result Date: 06/18/2022 CLINICAL DATA:  Provided history: Neuro deficit, acute, stroke suspected. Mental status change, unknown cause. EXAM: MRI HEAD WITHOUT AND WITH CONTRAST MRA HEAD WITHOUT CONTRAST TECHNIQUE: Multiplanar, multi-echo pulse sequences  of the brain and surrounding structures were acquired without and with intravenous contrast. Angiographic images of the Circle of Willis were acquired using MRA technique without intravenous contrast. CONTRAST:  32m GADAVIST GADOBUTROL 1 MMOL/ML IV SOLN COMPARISON:  Brain MRI 06/12/2022. CT angiogram head/neck 12/26/2019. FINDINGS: MRI HEAD FINDINGS Intermittently motion degraded examination, limiting evaluation. Most notably, the axial T2 FLAIR sequence is moderately motion degraded, the coronal T2 and T2 FLAIR sequences through the hippocampi are mild to moderately motion degraded and the sagittal T1 weighted postcontrast sequence is moderately motion degraded. Brain: No age advanced or lobar predominant parenchymal atrophy. Redemonstrated moderate-sized chronic cortical/subcortical infarct within the left parietal lobe (posterior left MCA territory and left MCA/PCA watershed territory). Chronic hemosiderin deposition within portions of this infarct territory. New from the prior brain MRI of 06/12/2022, there is cortical diffusion-weighted signal abnormality along the margins of this chronic infarct (within the right parietal lobe, at the left temporoparietal junction and within the lateral left occipital lobe) (for instance as seen on series 9, images 17 and 23). Subtle diffusion weighted signal abnormality is also suspected within the posteromedial left thalamus (series 9, image 17). Findings are suspicious for seizure related changes, although acute infarcts are also a consideration. Redemonstrated tiny foci of diffusion-weighted signal abnormality and T2 FLAIR hyperintense signal abnormality measuring up to 3 mm within the anterior right corona radiata (series 11, image 17) and left occipital lobe white matter (series 9, image 16). These were present on the prior brain MRI of 06/12/2022, and likely reflect small subacute infarcts (right MCA territory and left PCA territory, respectively). New 2 mm acute white  matter infarct immediately lateral to the right caudate nucleus (series 9, image 19). Background multifocal T2 FLAIR hyperintense signal abnormality within the cerebral white matter (mild) and within the pons (moderate). Unchanged small chronic infarcts within the bilateral cerebellar hemispheres. No evidence of an intracranial mass. No extra-axial fluid collection. No midline shift. No definite pathologic intracranial enhancement. Vascular: Maintained flow voids within the proximal large arterial vessels. Skull and upper cervical spine: Heterogeneous bone marrow signal with numerous foci of osseous enhancement. These findings are compatible with widespread multiple myeloma. Incompletely assessed cervical spondylosis. Sinuses/Orbits: No mass or acute finding within the imaged orbits. No significant paranasal sinus disease. Other: Trace fluid within the bilateral mastoid air cells. MRA HEAD FINDINGS Mildly motion degraded exam. Anterior circulation: The intracranial internal carotid arteries are patent. Mild atherosclerotic irregularity of both vessels. The M1 middle cerebral arteries are patent. Atherosclerotic irregularity of the M2 and more distal MCA vessels, bilaterally. No M2 proximal branch occlusion or high-grade  proximal stenosis. The anterior cerebral arteries are patent. Atherosclerotic irregularity of both vessels. Most notably, there is an apparent moderate/severe stenosis within the proximal right ACA A2 segment, which is new from the prior CTA of 12/26/2019. Posterior circulation: The intracranial vertebral arteries are patent. The basilar artery is patent. Mild atherosclerotic irregularity of the basilar artery. The posterior cerebral arteries are patent. Atherosclerotic irregularity of both vessels. Most notably, there are sites of up to moderate stenosis within left PCA branches at P2/P3 junction, progressed (series 1033, image 2) and a progressive moderate stenosis within a right PCA branch at the  P2/P3 junction. A left posterior communicating artery is present. The right posterior communicating artery is diminutive or absent Anatomic variants: As described. MRI brain impression #2 and #3 will be called to the ordering clinician or representative by the Radiologist Assistant, and communication documented in the PACS or [employer]. IMPRESSION: MRI brain: 1. Intermittently motion degraded exam. 2. Foci of cortical diffusion-weighted signal abnormality along the margins of a chronic left parietal lobe infarct (within the left parietal lobe, at the left temporoparietal junction and within the lateral left occipital lobe). Subtle diffusion-weighted signal abnormality is also suspected within the posteromedial left thalamus. These findings are suspicious for seizure-related changes. However, alternatively, these may reflect acute infarcts. 3. New 2 mm acute white matter infarct immediately lateral to the right caudate head. 4. Redemonstrated tiny subacute infarcts within the anterior right corona radiata and left occipital lobe white matter (right MCA and left PCA vascular territories, respectively). 5. Background chronic small vessel ischemic changes, as described and unchanged from the recent prior brain MRI of 06/12/2022. 6. Redemonstrated small chronic infarcts within both cerebellar hemispheres. 7. Widespread multiple myeloma. MRA head: 1. No intracranial large vessel occlusion is identified. 2. Intracranial atherosclerotic disease with multifocal stenoses, most notably as follows. 3. Moderate/severe stenosis within the right ACA proximal A2 segment, new from the prior CTA head/neck of 12/26/2019. 4. Sites of up to moderate stenosis within left PCA branches at the P2/P3 junction, progressed. 5. Moderate stenosis within a right PCA branch at the P2/P3 junction, progressed. Electronically Signed   By: Kellie Simmering D.O.   On: 06/18/2022 19:39   MR ANGIO HEAD WO CONTRAST  Result Date:  06/18/2022 CLINICAL DATA:  Provided history: Neuro deficit, acute, stroke suspected. Mental status change, unknown cause. EXAM: MRI HEAD WITHOUT AND WITH CONTRAST MRA HEAD WITHOUT CONTRAST TECHNIQUE: Multiplanar, multi-echo pulse sequences of the brain and surrounding structures were acquired without and with intravenous contrast. Angiographic images of the Circle of Willis were acquired using MRA technique without intravenous contrast. CONTRAST:  4m GADAVIST GADOBUTROL 1 MMOL/ML IV SOLN COMPARISON:  Brain MRI 06/12/2022. CT angiogram head/neck 12/26/2019. FINDINGS: MRI HEAD FINDINGS Intermittently motion degraded examination, limiting evaluation. Most notably, the axial T2 FLAIR sequence is moderately motion degraded, the coronal T2 and T2 FLAIR sequences through the hippocampi are mild to moderately motion degraded and the sagittal T1 weighted postcontrast sequence is moderately motion degraded. Brain: No age advanced or lobar predominant parenchymal atrophy. Redemonstrated moderate-sized chronic cortical/subcortical infarct within the left parietal lobe (posterior left MCA territory and left MCA/PCA watershed territory). Chronic hemosiderin deposition within portions of this infarct territory. New from the prior brain MRI of 06/12/2022, there is cortical diffusion-weighted signal abnormality along the margins of this chronic infarct (within the right parietal lobe, at the left temporoparietal junction and within the lateral left occipital lobe) (for instance as seen on series 9, images 17 and 23). Subtle diffusion  weighted signal abnormality is also suspected within the posteromedial left thalamus (series 9, image 17). Findings are suspicious for seizure related changes, although acute infarcts are also a consideration. Redemonstrated tiny foci of diffusion-weighted signal abnormality and T2 FLAIR hyperintense signal abnormality measuring up to 3 mm within the anterior right corona radiata (series 11, image  17) and left occipital lobe white matter (series 9, image 16). These were present on the prior brain MRI of 06/12/2022, and likely reflect small subacute infarcts (right MCA territory and left PCA territory, respectively). New 2 mm acute white matter infarct immediately lateral to the right caudate nucleus (series 9, image 19). Background multifocal T2 FLAIR hyperintense signal abnormality within the cerebral white matter (mild) and within the pons (moderate). Unchanged small chronic infarcts within the bilateral cerebellar hemispheres. No evidence of an intracranial mass. No extra-axial fluid collection. No midline shift. No definite pathologic intracranial enhancement. Vascular: Maintained flow voids within the proximal large arterial vessels. Skull and upper cervical spine: Heterogeneous bone marrow signal with numerous foci of osseous enhancement. These findings are compatible with widespread multiple myeloma. Incompletely assessed cervical spondylosis. Sinuses/Orbits: No mass or acute finding within the imaged orbits. No significant paranasal sinus disease. Other: Trace fluid within the bilateral mastoid air cells. MRA HEAD FINDINGS Mildly motion degraded exam. Anterior circulation: The intracranial internal carotid arteries are patent. Mild atherosclerotic irregularity of both vessels. The M1 middle cerebral arteries are patent. Atherosclerotic irregularity of the M2 and more distal MCA vessels, bilaterally. No M2 proximal branch occlusion or high-grade proximal stenosis. The anterior cerebral arteries are patent. Atherosclerotic irregularity of both vessels. Most notably, there is an apparent moderate/severe stenosis within the proximal right ACA A2 segment, which is new from the prior CTA of 12/26/2019. Posterior circulation: The intracranial vertebral arteries are patent. The basilar artery is patent. Mild atherosclerotic irregularity of the basilar artery. The posterior cerebral arteries are patent.  Atherosclerotic irregularity of both vessels. Most notably, there are sites of up to moderate stenosis within left PCA branches at P2/P3 junction, progressed (series 1033, image 2) and a progressive moderate stenosis within a right PCA branch at the P2/P3 junction. A left posterior communicating artery is present. The right posterior communicating artery is diminutive or absent Anatomic variants: As described. MRI brain impression #2 and #3 will be called to the ordering clinician or representative by the Radiologist Assistant, and communication documented in the PACS or [employer]. IMPRESSION: MRI brain: 1. Intermittently motion degraded exam. 2. Foci of cortical diffusion-weighted signal abnormality along the margins of a chronic left parietal lobe infarct (within the left parietal lobe, at the left temporoparietal junction and within the lateral left occipital lobe). Subtle diffusion-weighted signal abnormality is also suspected within the posteromedial left thalamus. These findings are suspicious for seizure-related changes. However, alternatively, these may reflect acute infarcts. 3. New 2 mm acute white matter infarct immediately lateral to the right caudate head. 4. Redemonstrated tiny subacute infarcts within the anterior right corona radiata and left occipital lobe white matter (right MCA and left PCA vascular territories, respectively). 5. Background chronic small vessel ischemic changes, as described and unchanged from the recent prior brain MRI of 06/12/2022. 6. Redemonstrated small chronic infarcts within both cerebellar hemispheres. 7. Widespread multiple myeloma. MRA head: 1. No intracranial large vessel occlusion is identified. 2. Intracranial atherosclerotic disease with multifocal stenoses, most notably as follows. 3. Moderate/severe stenosis within the right ACA proximal A2 segment, new from the prior CTA head/neck of 12/26/2019. 4. Sites of up to  moderate stenosis within left PCA branches  at the P2/P3 junction, progressed. 5. Moderate stenosis within a right PCA branch at the P2/P3 junction, progressed. Electronically Signed   By: Kellie Simmering D.O.   On: 06/18/2022 19:39   DG Chest Port 1 View  Result Date: 06/18/2022 CLINICAL DATA:  Possible sepsis EXAM: PORTABLE CHEST 1 VIEW COMPARISON:  06/08/22 CXR FINDINGS: Right chest part with the tip at the cavoatrial junction. Unchanged cardiac and mediastinal contours. No pleural effusion. No pneumothorax. Bibasilar atelectasis. Visualized upper abdomen is unremarkable.No displaced rib fractures. IMPRESSION: Bibasilar atelectasis. Electronically Signed   By: Marin Roberts M.D.   On: 06/18/2022 12:12   MR Brain W Wo Contrast  Result Date: 06/12/2022 CLINICAL DATA:  57 year old female with multiple myeloma. Headache. Weakness. EXAM: MRI HEAD WITHOUT AND WITH CONTRAST TECHNIQUE: Multiplanar, multiecho pulse sequences of the brain and surrounding structures were obtained without and with intravenous contrast. CONTRAST:  67m GADAVIST GADOBUTROL 1 MMOL/ML IV SOLN COMPARISON:  Brain MRI 12/27/2019.  Cervical spine MRI 03/30/2022. FINDINGS: Brain: Chronic posterior left MCA infarct which was acute on the previous MRI. Encephalomalacia and gliosis there with mild associated hemosiderin. Occasional other small, subtle areas of cortical encephalomalacia (right superior frontal gyrus series 12, image 29) some of which are also related to ischemia seen on the prior exam. Small but numerous bilateral chronic cerebellar infarcts have progressed since 2021 on series 10, image 42. But a punctate area of abnormal diffusion in the left occipital lobe white matter on series 5, image 16 appears mildly restricted on ADC. And there is a more subtle similar finding in the right anterior corona radiata on series 5, image 21. These are in areas of white matter T2/FLAIR hyperintensity. No other restricted diffusion. No midline shift, mass effect, evidence of mass lesion,  ventriculomegaly, extra-axial collection or acute intracranial hemorrhage. Cervicomedullary junction and pituitary are within normal limits. Mild to moderate T2 and FLAIR heterogeneity in the pons appears increased since 2021. No abnormal enhancement of the brain.  No dural thickening. Vascular: Major intracranial vascular flow voids are stable since 2021. Major dural venous sinuses are enhancing and appear to be patent. Skull and upper cervical spine: Heterogeneous bone marrow signal compatible with widespread multiple myeloma. No other osseous abnormality identified. Sinuses/Orbits: Rightward gaze, otherwise negative orbits soft tissues. Paranasal sinuses are stable and well aerated. Other: Mild chronic left mastoid effusion is stable since 2021. Other visible internal auditory structures appear normal. Negative visible scalp and face. IMPRESSION: 1. Difficult to exclude punctate acute/subacute white matter infarcts in both the Right MCA and Left PCA vascular territories. No associated hemorrhage or mass effect. 2. Underlying chronic brain ischemia with no other acute intracranial abnormality. 3. Widespread Multiple Myeloma. Electronically Signed   By: HGenevie AnnM.D.   On: 06/12/2022 12:25    DISCHARGE EXAMINATION: Vitals:   07/10/22 2219 07/11/22 0622 07/11/22 0852 07/11/22 1205  BP:  125/74 117/81 96/65  Pulse:   (!) 117 91  Resp: 20 20    Temp:  99.2 F (37.3 C) 98.7 F (37.1 C) 98 F (36.7 C)  TempSrc:  Axillary Axillary Oral  SpO2:  99% 100% 96%  Weight:      Height:       General appearance: Awake alert.  In no distress Resp: Clear to auscultation bilaterally.  Normal effort Cardio: S1-S2 is normal regular.  No S3-S4.  No rubs murmurs or bruit GI: Abdomen is soft.  Nontender nondistended.  Bowel sounds are present normal.  No masses organomegaly   DISPOSITION: Home with husband  Discharge Instructions     Call MD for:  difficulty breathing, headache or visual disturbances   Complete  by: As directed    Call MD for:  extreme fatigue   Complete by: As directed    Call MD for:  persistant dizziness or light-headedness   Complete by: As directed    Call MD for:  persistant nausea and vomiting   Complete by: As directed    Call MD for:  severe uncontrolled pain   Complete by: As directed    Call MD for:  temperature >100.4   Complete by: As directed    Discharge diet:   Complete by: As directed    Dysphagia 3 Diet with thin liquids   Discharge instructions   Complete by: As directed    Please take your medications as prescribed.  Please try and keep your appointment with your oncologist tomorrow, 07/12/2022.  Seek attention if your symptoms worsen. Medication changes have been made due to low blood pressure.  You were cared for by a hospitalist during your hospital stay. If you have any questions about your discharge medications or the care you received while you were in the hospital after you are discharged, you can call the unit and asked to speak with the hospitalist on call if the hospitalist that took care of you is not available. Once you are discharged, your primary care physician will handle any further medical issues. Please note that NO REFILLS for any discharge medications will be authorized once you are discharged, as it is imperative that you return to your primary care physician (or establish a relationship with a primary care physician if you do not have one) for your aftercare needs so that they can reassess your need for medications and monitor your lab values. If you do not have a primary care physician, you can call [phone] for a physician referral.   Increase activity slowly   Complete by: As directed           Allergies as of 07/11/2022   No Known Allergies      Medication List     STOP taking these medications    amLODipine 2.5 MG tablet Commonly known as: NORVASC   bumetanide 0.5 MG tablet Commonly known as: BUMEX   doxycycline 100 MG  capsule Commonly known as: VIBRAMYCIN   Dulaglutide 1.5 MG/0.5ML Sopn   Insulin Lispro w/ Trans Port 100 UNIT/ML Sopn   losartan 100 MG tablet Commonly known as: COZAAR   scopolamine 1 MG/3DAYS Commonly known as: TRANSDERM-SCOP       TAKE these medications    acyclovir 400 MG tablet Commonly known as: ZOVIRAX TAKE (1) TABLET BY MOUTH TWICE DAILY.   albuterol 108 (90 Base) MCG/ACT inhaler Commonly known as: VENTOLIN HFA Inhale 2 puffs into the lungs every 4 (four) hours as needed for wheezing or shortness of breath.   allopurinol 300 MG tablet Commonly known as: ZYLOPRIM TAKE (1) TABLET BY MOUTH ONCE DAILY.   aspirin EC 81 MG tablet Take 1 tablet (81 mg total) by mouth daily with breakfast. For stroke prophylaxis   BD Pen Needle Nano U/F 32G X 4 MM Misc Generic drug: Insulin Pen Needle Inject into the skin.   carvedilol 3.125 MG tablet Commonly known as: COREG Take 1 tablet (3.125 mg total) by mouth 2 (two) times daily with a meal. What changed:  medication strength how much to take when to take  this   dexamethasone 4 MG tablet Commonly known as: DECADRON Take 5 tablets (20 mg total) by mouth 2 (two) times a week. Take on day 1 and day 3 along with Xpovio. Notes to patient: Continue normal routine   diclofenac Sodium 1 % Gel Commonly known as: Voltaren Apply 2 g topically 4 (four) times daily. Notes to patient: Continue normal routine   docusate sodium 100 MG capsule Commonly known as: COLACE Take 100 mg by mouth daily. Notes to patient: Continue normal routine   escitalopram 10 MG tablet Commonly known as: LEXAPRO TAKE (1) TABLET BY MOUTH ONCE DAILY. MAY START WITH 1/2 TABLET FOR 7 DAYS.   fluticasone 50 MCG/ACT nasal spray Commonly known as: FLONASE INSTILL 2 SPRAYS INTO BOTH NOSTRILS DAILY   Lacosamide 100 MG Tabs Take 1 tablet (100 mg total) by mouth 2 (two) times daily.   levETIRAcetam 1000 MG tablet Commonly known as: KEPPRA Take 1 tablet  (1,000 mg total) by mouth 2 (two) times daily.   lidocaine-prilocaine cream Commonly known as: EMLA Apply 1 Application topically as needed (Apply to port prior to treatment and flushes). Notes to patient: Continue normal routine   magnesium oxide 400 (241.3 Mg) MG tablet Commonly known as: MAG-OX Take 1 tablet (400 mg total) by mouth 3 (three) times daily.   melatonin 3 MG Tabs tablet Take 1 tablet (3 mg total) by mouth at bedtime as needed.   metFORMIN 500 MG tablet Commonly known as: GLUCOPHAGE Take 1 tablet (500 mg total) by mouth 2 (two) times daily.   metoCLOPramide 10 MG tablet Commonly known as: REGLAN Take 1 tablet (10 mg total) by mouth every 8 (eight) hours as needed for nausea. Notes to patient: Continue normal routine   nitrofurantoin (macrocrystal-monohydrate) 100 MG capsule Commonly known as: MACROBID TAKE (1) CAPSULE BY MOUTH AT BEDTIME. What changed: See the new instructions. Notes to patient: Continue normal routine   oxyCODONE-acetaminophen 5-325 MG tablet Commonly known as: PERCOCET/ROXICET Take 1 tablet by mouth 2 (two) times daily as needed for severe pain. Notes to patient: Continue normal routine   pantoprazole 40 MG tablet Commonly known as: PROTONIX TAKE (1) TABLET BY MOUTH ONCE DAILY. What changed: See the new instructions.   polyethylene glycol 17 g packet Commonly known as: MIRALAX / GLYCOLAX Take 17 g by mouth daily as needed for mild constipation. Notes to patient: Continue normal routine   pregabalin 200 MG capsule Commonly known as: LYRICA TAKE (1) CAPSULE BY MOUTH TWICE DAILY. What changed: See the new instructions.   QUEtiapine 25 MG tablet Commonly known as: SEROQUEL Take 1 tablet (25 mg total) by mouth daily.   rosuvastatin 5 MG tablet Commonly known as: CRESTOR Take 1 tablet (5 mg total) by mouth daily.   selinexor Therapy Pack (80 mg twice weekly) Commonly known as: XPOVIO Take 4 tablets (80 mg total) by mouth 2 (two)  times a week. Take on days 1 and 3 of each week. Notes to patient: Continue normal routine   Systane Balance 0.6 % Soln Generic drug: Propylene Glycol Apply 1 drop to eye daily as needed (dry eye). Notes to patient: Continue normal routine   traMADol 50 MG tablet Commonly known as: ULTRAM Take 1 tablet (50 mg total) by mouth every 8 (eight) hours as needed for severe pain.   traZODone 50 MG tablet Commonly known as: DESYREL Take 1 tablet (50 mg total) by mouth at bedtime.          Follow-up Information  [facility] Follow up.   Specialty: Home Health Services Why: The home health agency will contact you for the first home visit. Contact information: [place] [address] [phone]         [employer] Follow up.   Why: Medical supply company for the bed, lift, wheelchair Contact information: [phone]        Derek Jack, MD Follow up on 07/12/2022.   Specialty: Hematology Why: keep tomorrow's appointment. Contact information: [place] [address] [phone]                 TOTAL DISCHARGE TIME: 35 minutes  [facility] Pager on [email or web address]  07/12/2022, 11:57 AM

## 2022-07-11 NOTE — Progress Notes (Signed)
Speech Language Pathology Treatment: Cognitive-Linquistic  Patient Details Name: RONNESHA MESTER MRN: [id number] DOB: 20-Apr-1966 Today's Date: 07/11/2022 Time:  -     Assessment / Plan / Recommendation Clinical Impression  Pt demonstrates ongoing aphasia, able to express basic information with conversational structure applied. Pt can describe some holiday traditions she is looking forward to with paraphasic errors but meaning is in tact. Pt has been tolerating a mechanical soft diet and thin liquids. She is about to d/c home and family is aware of need for softer food. Recommend HH SLP at dc if desired by family.    HPI HPI: KYLAH MARESH is a 57 year old female with refractory multiple myeloma status post relapse, hypertension, gout, GERD, type 2 diabetes mellitus, migraine headaches, stroke with residual aphasia presented to ED on 06/19/22 due to ongoing weakness and lethargy. She reportedly last had chemotherapy 2 weeks ago. She has recently received PRBC and platelets in the cancer center. Her work up revealed a K of 2.5, Hg was 7.7, WBC 2.6, platelets 15. She noted no spontaneous bleeding. While I was evaluating patient in the ED she was noted to become acutely somnolent and then eyes rolled in back of head and she had a full tonic clonic seizure lasting about 2 to 3 mins; [facility] onRobin R Machorro is a 57 year old female with refractory multiple myeloma status post relapse, hypertension, gout, GERD, type 2 diabetes mellitus, migraine headaches, stroke with residual aphasia presented to ED today due to ongoing weakness and lethargy. She reportedly last had chemotherapy 2 weeks ago. She has recently received PRBC and platelets in the cancer center. Her work up revealed a K of 2.5, Hg was 7.7, WBC 2.6, platelets 15. She noted no spontaneous bleeding. While I was evaluating patient in the ED she was noted to become acutely somnolent and then eyes rolled in back of head and she had a full  tonic clonic seizure lasting about 2 to 3 mins. I called for emergency lorazepam 1 mg IV and after that was given it subsided and she was in a post-ictal state. She bit her tongue and there was some bleeding from mouth. MRI on admission showed a 2 mm acute white matter infarct lateral to the right caudate head, and also tiny subacute infarcts in the anterior right corona radiata and left occipital lobe white matter. Pt transferred to [facility] hospital from [place] has been following for dysphagia tx/management.      SLP Plan  Continue with current plan of care      Recommendations for follow up therapy are one component of a multi-disciplinary discharge planning process, led by the attending physician.  Recommendations may be updated based on patient status, additional functional criteria and insurance authorization.    Recommendations  Diet recommendations: Dysphagia 3 (mechanical soft);Thin liquid Liquids provided via: Straw;Cup Medication Administration: Whole meds with liquid Supervision: Staff to assist with self feeding;Full supervision/cueing for compensatory strategies Compensations: Slow rate;Small sips/bites Postural Changes and/or Swallow Maneuvers: Seated upright 90 degrees                Oral Care Recommendations: Oral care BID;Staff/trained caregiver to provide oral care Follow Up Recommendations: Follow physician's recommendations for discharge plan and follow up therapies Assistance recommended at discharge: Frequent or constant Supervision/Assistance SLP Visit Diagnosis: Dysphagia, unspecified (R13.10) Plan: Continue with current plan of care           Charrise Lardner, Katherene Ponto  07/11/2022, 12:13 PM

## 2022-07-11 NOTE — Progress Notes (Signed)
Joanna Reid to be D/C'd Home with home health per MD order.  Discussed with the patient and caregiver and all questions fully answered.  VSS, Skin clean, dry and intact without evidence of skin break down, no evidence of skin tears noted. Port-a-catheter de-accessed by IV team.   An After Visit Summary was printed and given to the patient's spouse. Patient prescriptions sent to patient's pharmacy.  D/c education completed with patient/family including follow up instructions, medication list, d/c activities limitations if indicated, with other d/c instructions as indicated by MD - patient able to verbalize understanding, all questions fully answered.   Patient instructed to return to ED, call 911, or call MD for any changes in condition.  Patient spouse deciding to take patient home via car vs. Waiting for [facility] to be available.     Patient escorted via [place], and [place] home via private auto.  Manuella Ghazi 07/11/2022 4:42 PM

## 2022-07-12 ENCOUNTER — Other Ambulatory Visit: Payer: BC Managed Care – PPO

## 2022-07-12 ENCOUNTER — Encounter: Payer: Self-pay | Admitting: *Deleted

## 2022-07-12 ENCOUNTER — Ambulatory Visit: Payer: BC Managed Care – PPO | Admitting: Hematology

## 2022-07-12 NOTE — Progress Notes (Signed)
Shenandoah Ragain's husband was contacted by telephone to verify understanding of discharge instructions status post their most recent discharge from the hospital on the date:  07/11/22.  Inpatient discharge AVS was re-reviewed with patient, along with cancer center appointments.  Verification of understanding for oncology specific follow-up was validated using the Teach Back method.    Transportation to appointments were confirmed for the patient as being self/caregiver.  All questions were addressed to their satisfaction upon completion of this post discharge follow-up call for outpatient oncology.

## 2022-07-17 ENCOUNTER — Telehealth: Payer: Self-pay | Admitting: Internal Medicine

## 2022-07-17 NOTE — Telephone Encounter (Signed)
Tressia Danas called from [facility] to let Dr Posey Pronto know. Did frequency PT for 2 week 1 , 1 week 3.  Any questions call Ben back at [phone]

## 2022-07-18 ENCOUNTER — Encounter (HOSPITAL_COMMUNITY): Payer: Self-pay | Admitting: Hematology

## 2022-07-18 ENCOUNTER — Inpatient Hospital Stay: Payer: BC Managed Care – PPO

## 2022-07-18 ENCOUNTER — Inpatient Hospital Stay (HOSPITAL_BASED_OUTPATIENT_CLINIC_OR_DEPARTMENT_OTHER): Payer: BC Managed Care – PPO | Admitting: Hematology

## 2022-07-18 DIAGNOSIS — A419 Sepsis, unspecified organism: Secondary | ICD-10-CM | POA: Diagnosis not present

## 2022-07-18 DIAGNOSIS — R569 Unspecified convulsions: Secondary | ICD-10-CM | POA: Diagnosis not present

## 2022-07-18 DIAGNOSIS — I6389 Other cerebral infarction: Secondary | ICD-10-CM

## 2022-07-18 DIAGNOSIS — C9000 Multiple myeloma not having achieved remission: Secondary | ICD-10-CM | POA: Diagnosis not present

## 2022-07-18 DIAGNOSIS — R4182 Altered mental status, unspecified: Secondary | ICD-10-CM | POA: Diagnosis not present

## 2022-07-18 DIAGNOSIS — R300 Dysuria: Secondary | ICD-10-CM

## 2022-07-18 LAB — COMPREHENSIVE METABOLIC PANEL
ALT: 27 U/L (ref 0–44)
AST: 31 U/L (ref 15–41)
Albumin: 2.4 g/dL — ABNORMAL LOW (ref 3.5–5.0)
Alkaline Phosphatase: 240 U/L — ABNORMAL HIGH (ref 38–126)
Anion gap: 14 (ref 5–15)
BUN: 32 mg/dL — ABNORMAL HIGH (ref 6–20)
CO2: 24 mmol/L (ref 22–32)
Calcium: 8.1 mg/dL — ABNORMAL LOW (ref 8.9–10.3)
Chloride: 98 mmol/L (ref 98–111)
Creatinine, Ser: 1.13 mg/dL — ABNORMAL HIGH (ref 0.44–1.00)
GFR, Estimated: 57 mL/min — ABNORMAL LOW (ref >60)
Glucose, Bld: 104 mg/dL — ABNORMAL HIGH (ref 70–99)
Potassium: 3.3 mmol/L — ABNORMAL LOW (ref 3.5–5.1)
Sodium: 136 mmol/L (ref 135–145)
Total Bilirubin: 1.9 mg/dL — ABNORMAL HIGH (ref 0.3–1.2)
Total Protein: 5.3 g/dL — ABNORMAL LOW (ref 6.5–8.1)

## 2022-07-18 LAB — URINALYSIS, ROUTINE W REFLEX MICROSCOPIC
Bilirubin Urine: NEGATIVE
Glucose, UA: NEGATIVE mg/dL
Ketones, ur: NEGATIVE mg/dL
Leukocytes,Ua: NEGATIVE
Nitrite: NEGATIVE
Protein, ur: 30 mg/dL — AB
Specific Gravity, Urine: 1.023 (ref 1.005–1.030)
pH: 5 (ref 5.0–8.0)

## 2022-07-18 LAB — MAGNESIUM: Magnesium: 1.1 mg/dL — ABNORMAL LOW (ref 1.7–2.4)

## 2022-07-18 LAB — SAMPLE TO BLOOD BANK

## 2022-07-18 MED ORDER — SODIUM CHLORIDE 0.9 % IV SOLN: INTRAVENOUS | Status: DC

## 2022-07-18 MED ORDER — MAGNESIUM SULFATE 2 GM/50ML IV SOLN
2.0000 g | INTRAVENOUS | Status: AC
  Administered 2022-07-18 (×2): 2 g via INTRAVENOUS
  Filled 2022-07-18: qty 50

## 2022-07-18 MED ORDER — MAGNESIUM SULFATE 4 GM/100ML IV SOLN: 4.0000 g | Freq: Once | INTRAVENOUS | Status: DC

## 2022-07-18 MED ORDER — SODIUM CHLORIDE 0.9% FLUSH
10.0000 mL | Freq: Once | INTRAVENOUS | Status: AC
  Administered 2022-07-18: 10 mL

## 2022-07-18 MED ORDER — CIPROFLOXACIN HCL 500 MG PO TABS: 500.0000 mg | ORAL_TABLET | Freq: Two times a day (BID) | ORAL | 0 refills | Status: AC

## 2022-07-18 MED ORDER — HEPARIN SOD (PORK) LOCK FLUSH 100 UNIT/ML IV SOLN
500.0000 [IU] | Freq: Once | INTRAVENOUS | Status: AC
  Administered 2022-07-18: 500 [IU] via INTRAVENOUS

## 2022-07-18 NOTE — Patient Instructions (Addendum)
[place] at [facility] Discharge Instructions   You were seen and examined today by Dr. Delton Coombes.  He reviewed the results of your lab work. Your magnesium is critically low at 1.1. We will give you IV magnesium in the clinic. Your potassium was also low at 3.3. We will give potassium pills in the clinic today.   Return as scheduled.   Thank you for choosing [place] at [facility] to provide your oncology and hematology care.  To afford each patient quality time with our provider, please arrive at least 15 minutes before your scheduled appointment time.   If you have a lab appointment with the [place] [facility] please come in thru the Main Entrance and check in at the main information desk.  You need to re-schedule your appointment should you arrive 10 or more minutes late.  We strive to give you quality time with our providers, and arriving late affects you and other patients whose appointments are after yours.  Also, if you no show three or more times for appointments you may be dismissed from the clinic at the providers discretion.     Again, thank you for choosing [facility].  Our hope is that these requests will decrease the amount of time that you wait before being seen by our physicians.       _____________________________________________________________  Should you have questions after your visit to [facility], please contact our office at [phone] and follow the prompts.  Our office hours are 8:00 a.m. and 4:30 p.m. Monday - Friday.  Please note that voicemails left after 4:00 p.m. may not be returned until the following business day.  We are closed weekends and major holidays.  You do have access to a nurse 24-7, just call the main number to the clinic [phone] and do not press any options, hold on the line and a nurse will answer the phone.    For prescription refill requests, have your pharmacy  contact our office and allow 72 hours.    Due to Covid, you will need to wear a mask upon entering the hospital. If you do not have a mask, a mask will be given to you at the Main Entrance upon arrival. For doctor visits, patients may have 1 support person age 58 or older with them. For treatment visits, patients can not have anyone with them due to social distancing guidelines and our immunocompromised population.

## 2022-07-18 NOTE — Progress Notes (Signed)
[place], [place] [id number]   CLINIC:  Medical Oncology/Hematology  PCP:  Lindell Spar, MD [address] / [place] [address] [phone]   REASON FOR VISIT:  Follow-up for multiple myeloma  PRIOR THERAPY:  1. RVD x 4 cycles from 11/01/2017 through 01/14/2018. 2. Stem cell transplant on 02/20/2018. 3. Velcade from 06/11/2018 to 12/22/2019.  NGS Results: not done  CURRENT THERAPY: Daratumumab, carfilzomib and dexamethasone  BRIEF ONCOLOGIC HISTORY:  Oncology History  Multiple myeloma ([place])  10/29/2017 Initial Diagnosis   Multiple myeloma ([place])   11/01/2017 - 01/24/2018 Chemotherapy   The patient had dexamethasone (DECADRON) 4 MG tablet, 1 of 1 cycle, Start date: --, End date: -- lenalidomide (REVLIMID) 25 MG capsule, 1 of 1 cycle, Start date: --, End date: -- bortezomib SQ (VELCADE) chemo injection 3 mg, 1.3 mg/m2 = 3 mg, Subcutaneous,  Once, 5 of 5 cycles Administration: 3 mg (11/01/2017), 3 mg (11/08/2017), 3 mg (11/05/2017), 3 mg (11/22/2017), 3 mg (11/12/2017), 3 mg (11/29/2017), 3 mg (11/26/2017), 3 mg (12/03/2017), 3 mg (12/13/2017), 3 mg (12/20/2017), 3 mg (12/17/2017), 3 mg (12/24/2017), 3 mg (01/03/2018), 3 mg (01/10/2018), 3 mg (01/07/2018), 3 mg (01/14/2018), 3 mg (01/24/2018)  for chemotherapy treatment.    06/11/2018 - 12/20/2021 Chemotherapy   Patient is on Treatment Plan : MYELOMA MAINTENANCE Bortezomib SQ q 7d x 6 weeks, two weeks off then q 14d     01/25/2022 - 02/08/2022 Chemotherapy   Patient is on Treatment Plan : MYELOMA RELAPSED/REFRACTORY Carfilzomib (20/70) + Daratumumab SQ + Dexamethasone (20/40) DaraKd q28d     01/25/2022 - 04/05/2022 Chemotherapy   Patient is on Treatment Plan : MYELOMA RELAPSED/REFRACTORY Carfilzomib (20/70) + Daratumumab SQ + Dexamethasone (20/40) DaraKd q28d       CANCER STAGING:  Cancer Staging  Multiple myeloma ([place]) Staging form: Plasma Cell Myeloma and Plasma Cell Disorders, AJCC 8th Edition -  Clinical: No stage assigned - Unsigned - Clinical: No stage assigned - Unsigned   INTERVAL HISTORY:  [name], a 57 y.o. female, seen for follow-up of multiple myeloma.  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  She is currently on Keppra and Vimpat.  She is currently at home but is receiving PT OT and speech therapy.  Husband at bedside.  He reports that every time she does this often wakes up she gets agitated/confused.  She can hold intelligent conversation if not agitated.  He reports that she has been lethargic since seizure medications started.  She is requiring assistance from transferring from bed to recliner but reportedly getting stronger in the legs.  She is having some burning on urination.  REVIEW OF SYSTEMS:  Review of Systems  Constitutional:  Negative for appetite change and fatigue.  Respiratory:  Positive for shortness of breath.   Gastrointestinal:  Negative for diarrhea.  Genitourinary:  Positive for dysuria.   Musculoskeletal:  Positive for back pain.  Neurological:  Positive for headaches.  Psychiatric/Behavioral:  Positive for sleep disturbance.   All other systems reviewed and are negative.   PAST MEDICAL/SURGICAL HISTORY:  Past Medical History:  Diagnosis Date   Acid reflux    Allergic rhinitis    Cancer ([place])    multiple myeloma   Diabetes mellitus    type 2   Gout    Gout    H/o COVID-19--- was Positive 09/17/2019, Negative 12/23/19 AND also  Neg on  12/26/19 12/26/2019   HBP (high blood pressure)    History of kidney stones    Migraines    Multiple myeloma ([place]) 10/29/2017   Past Surgical History:  Procedure Laterality Date   BREAST CYST EXCISION Left    2009 no visible scar on skin   CESAREAN SECTION     COLONOSCOPY WITH PROPOFOL N/A 12/25/2019   Procedure: COLONOSCOPY WITH PROPOFOL;  Surgeon: Rogene Houston, MD;  [facility];  Service: Endoscopy;  Laterality: N/A;  730   EXTRACORPOREAL SHOCK WAVE LITHOTRIPSY Left 10/10/2017   Procedure: LEFT EXTRACORPOREAL SHOCK WAVE LITHOTRIPSY (ESWL);  Surgeon: Bjorn Loser, MD;  Location: [facility];  Service: Urology;  Laterality: Left;   EYE SURGERY     HEMORRHOID SURGERY N/A 11/19/2012   Procedure: HEMORRHOIDECTOMY;  Surgeon: Jamesetta So, MD;  Location: [facility];  Service: General;  Laterality: N/A;   IR IMAGING GUIDED PORT INSERTION  01/18/2022   kidney stones  1998   LAPAROSCOPIC UNILATERAL SALPINGO OOPHERECTOMY  05/14/2012   Procedure: LAPAROSCOPIC UNILATERAL SALPINGO OOPHORECTOMY;  Surgeon: [name], MD;  Location: [facility];  Service: Gynecology;  Laterality: Right;  laparoscopic right salpingo-oophorectomy   PARTIAL HYSTERECTOMY     POLYPECTOMY  12/25/2019   Procedure: POLYPECTOMY;  Surgeon: Rogene Houston, MD;  [facility];  Service: Endoscopy;;   TONSILECTOMY, ADENOIDECTOMY, BILATERAL MYRINGOTOMY AND TUBES     VESICOVAGINAL FISTULA CLOSURE W/ TAH      SOCIAL HISTORY:  Social History   Socioeconomic History   Marital status: Married    Spouse name: [name]   Number of children: 1   Years of education: 12   Highest education level: Some college, no degree  Occupational History    Employer: [employer]  Tobacco Use   Smoking status: Former    Types: Cigarettes    Quit date: 02/27/1999    Years since quitting: 23.4   Smokeless tobacco: Never   Tobacco comments:    socially   Vaping Use   Vaping Use: Never used  Substance and Sexual Activity   Alcohol use: No    Alcohol/week: 0.0 standard drinks of alcohol   Drug use: No   Sexual activity: Yes    Birth control/protection: Surgical    Comment: hyst  Other Topics Concern   Not on file  Social History Narrative   Not on file   Social Determinants of Health   Financial Resource Strain: Low Risk  (08/08/2021)   Overall Financial Resource Strain (CARDIA)    Difficulty of Paying Living  Expenses: Not hard at all  Food Insecurity: No Food Insecurity (06/19/2022)   Hunger Vital Sign    Worried About Running Out of Food in the Last Year: Never true    Ran Out of Food in the Last Year: Never true  Transportation Needs: No Transportation Needs (06/19/2022)   PRAPARE - Hydrologist (Medical): No    Lack of Transportation (Non-Medical): No  Physical Activity: Unknown (08/08/2021)   Exercise Vital Sign    Days of Exercise per Week: Not on file    Minutes of Exercise per Session: 10 min  Stress: No Stress Concern Present (08/08/2021)   [place]    Feeling of Stress : Not at all  Social Connections: [place] (08/08/2021)   Social Connection and Isolation Panel [NHANES]    Frequency of Communication with Friends and Family: More  than three times a week    Frequency of Social Gatherings with Friends and Family: Twice a week    Attends Religious Services: More than 4 times per year    Active Member of [employer] or Organizations: Yes    Attends Music therapist: More than 4 times per year    Marital Status: Married  Human resources officer Violence: Not At Risk (06/19/2022)   Humiliation, Afraid, Rape, and Kick questionnaire    Fear of Current or Ex-Partner: No    Emotionally Abused: No    Physically Abused: No    Sexually Abused: No    FAMILY HISTORY:  Family History  Problem Relation Age of Onset   Arthritis Other    Cancer Other    Diabetes Other    Hypertension Mother    Dementia Mother    Diabetes Father    ALS Father    Diabetes Brother    Hypertension Brother    Cancer Paternal Aunt    COPD Maternal Grandmother    Cancer Maternal Grandfather    Anesthesia problems Paternal Grandfather     CURRENT MEDICATIONS:  Current Outpatient Medications  Medication Sig Dispense Refill   acyclovir (ZOVIRAX) 400 MG tablet TAKE (1) TABLET BY MOUTH TWICE DAILY. 60  tablet 6   albuterol (VENTOLIN HFA) 108 (90 Base) MCG/ACT inhaler Inhale 2 puffs into the lungs every 4 (four) hours as needed for wheezing or shortness of breath. 18 g 3   allopurinol (ZYLOPRIM) 300 MG tablet TAKE (1) TABLET BY MOUTH ONCE DAILY. 90 tablet 0   aspirin EC 81 MG tablet Take 1 tablet (81 mg total) by mouth daily with breakfast. For stroke prophylaxis 30 tablet 11   BD PEN NEEDLE NANO U/F 32G X 4 MM MISC Inject into the skin.     carvedilol (COREG) 3.125 MG tablet Take 1 tablet (3.125 mg total) by mouth 2 (two) times daily with a meal. 60 tablet 1   dexamethasone (DECADRON) 4 MG tablet Take 5 tablets (20 mg total) by mouth 2 (two) times a week. Take on day 1 and day 3 along with Xpovio. 40 tablet 3   diclofenac Sodium (VOLTAREN) 1 % GEL Apply 2 g topically 4 (four) times daily. 100 g 0   docusate sodium (COLACE) 100 MG capsule Take 100 mg by mouth daily.      escitalopram (LEXAPRO) 10 MG tablet TAKE (1) TABLET BY MOUTH ONCE DAILY. MAY START WITH 1/2 TABLET FOR 7 DAYS. 90 tablet 0   fluticasone (FLONASE) 50 MCG/ACT nasal spray INSTILL 2 SPRAYS INTO BOTH NOSTRILS DAILY 16 g 4   lacosamide 100 MG TABS Take 1 tablet (100 mg total) by mouth 2 (two) times daily. 60 tablet 2   levETIRAcetam (KEPPRA) 1000 MG tablet Take 1 tablet (1,000 mg total) by mouth 2 (two) times daily. 60 tablet 2   magnesium oxide (MAG-OX) 400 (241.3 Mg) MG tablet Take 1 tablet (400 mg total) by mouth 3 (three) times daily. 90 tablet 3   melatonin 3 MG TABS tablet Take 1 tablet (3 mg total) by mouth at bedtime as needed. 30 tablet 0   metFORMIN (GLUCOPHAGE) 500 MG tablet Take 1 tablet (500 mg total) by mouth 2 (two) times daily. 60 tablet 4   metoCLOPramide (REGLAN) 10 MG tablet Take 1 tablet (10 mg total) by mouth every 8 (eight) hours as needed for nausea. 90 tablet 3   nitrofurantoin, macrocrystal-monohydrate, (MACROBID) 100 MG capsule TAKE (1) CAPSULE BY MOUTH AT BEDTIME. (  Patient taking differently: Take 100 mg by  mouth at bedtime.) 90 capsule 0   oxyCODONE-acetaminophen (PERCOCET/ROXICET) 5-325 MG tablet Take 1 tablet by mouth 2 (two) times daily as needed for severe pain. 30 tablet 0   pantoprazole (PROTONIX) 40 MG tablet TAKE (1) TABLET BY MOUTH ONCE DAILY. (Patient taking differently: Take 40 mg by mouth daily.) 90 tablet 0   polyethylene glycol (MIRALAX / GLYCOLAX) 17 g packet Take 17 g by mouth daily as needed for mild constipation. 14 each 0   pregabalin (LYRICA) 200 MG capsule TAKE (1) CAPSULE BY MOUTH TWICE DAILY. (Patient taking differently: Take 200 mg by mouth 2 (two) times daily.) 60 capsule 11   Propylene Glycol (SYSTANE BALANCE) 0.6 % SOLN Apply 1 drop to eye daily as needed (dry eye).      QUEtiapine (SEROQUEL) 25 MG tablet Take 1 tablet (25 mg total) by mouth daily. 30 tablet 1   rosuvastatin (CRESTOR) 5 MG tablet Take 1 tablet (5 mg total) by mouth daily. 90 tablet 3   selinexor (XPOVIO) Therapy Pack (80 mg twice weekly) Take 4 tablets (80 mg total) by mouth 2 (two) times a week. Take on days 1 and 3 of each week. 32 tablet 3   traMADol (ULTRAM) 50 MG tablet Take 1 tablet (50 mg total) by mouth every 8 (eight) hours as needed for severe pain. 30 tablet 0   traZODone (DESYREL) 50 MG tablet Take 1 tablet (50 mg total) by mouth at bedtime. 30 tablet 1   ciprofloxacin (CIPRO) 500 MG tablet Take 1 tablet (500 mg total) by mouth 2 (two) times daily. 10 tablet 0   lidocaine-prilocaine (EMLA) cream Apply 1 Application topically as needed (Apply to port prior to treatment and flushes). (Patient not taking: Reported on 07/18/2022) 30 g 6   No current facility-administered medications for this visit.   Facility-Administered Medications Ordered in Other Visits  Medication Dose Route Frequency Provider Last Rate Last Admin   0.9 %  sodium chloride infusion   Intravenous Continuous Derek Jack, MD   Stopped at 07/18/22 1328   0.9 %  sodium chloride infusion   Intravenous Continuous Derek Jack, MD   Stopped at 07/18/22 1449    ALLERGIES:  No Known Allergies  PHYSICAL EXAM:  Performance status (ECOG): 1 - Symptomatic but completely ambulatory  There were no vitals filed for this visit.  Wt Readings from Last 3 Encounters:  07/06/22 243 lb 2.7 oz (110.3 kg)  06/08/22 237 lb (107.5 kg)  05/17/22 241 lb 4.8 oz (109.5 kg)   Physical Exam Vitals reviewed.  Constitutional:      Appearance: Normal appearance. She is obese.  Cardiovascular:     Rate and Rhythm: Normal rate and regular rhythm.     Pulses: Normal pulses.     Heart sounds: Normal heart sounds.  Pulmonary:     Effort: Pulmonary effort is normal.     Breath sounds: Normal breath sounds.  Neurological:     General: No focal deficit present.     Mental Status: She is alert and oriented to person, place, and time.  Psychiatric:        Mood and Affect: Mood normal.        Behavior: Behavior normal.     LABORATORY DATA:  I have reviewed the labs as listed.     Latest Ref Rng & Units 07/18/2022   10:58 AM 07/11/2022    5:00 AM 07/10/2022   10:45 AM  CBC  WBC 4.0 - 10.5 K/uL 9.1  5.1    Hemoglobin 12.0 - 15.0 g/dL 8.2  8.1  8.8   Hematocrit 36.0 - 46.0 % 25.5  23.0  25.6   Platelets 150 - 400 K/uL 29  32        Latest Ref Rng & Units 07/18/2022   10:58 AM 07/11/2022    5:00 AM 07/09/2022    6:10 AM  CMP  Glucose 70 - 99 mg/dL 104  75  91   BUN 6 - 20 mg/dL 32  13  12   Creatinine 0.44 - 1.00 mg/dL 1.13  0.69  0.70   Sodium 135 - 145 mmol/L 136  135  136   Potassium 3.5 - 5.1 mmol/L 3.3  3.5  3.2   Chloride 98 - 111 mmol/L 98  98  101   CO2 22 - 32 mmol/L '24  26  25   '$ Calcium 8.9 - 10.3 mg/dL 8.1  7.9  7.7   Total Protein 6.5 - 8.1 g/dL 5.3     Total Bilirubin 0.3 - 1.2 mg/dL 1.9     Alkaline Phos 38 - 126 U/L 240     AST 15 - 41 U/L 31     ALT 0 - 44 U/L 27       DIAGNOSTIC IMAGING:  I have independently reviewed the scans and discussed with the patient. Overnight EEG with video  Result  Date: 07/07/2022 [name], MD     07/07/2022  6:51 AM Patient Name: MARIVEL MCCLARTY MRN: [id number] Epilepsy Attending: [name] Referring Physician/Provider: [name], MD Duration: 07/06/2022 1545 to 07/07/2022 9678  Patient history:  57 y.o. female with a history of seizure who is undergoing an EEG to evaluate for seizures.  Level of alertness: Awake, sleep  AEDs during EEG study: Phenobarb, PGB, LEV, LCM  Technical aspects: This EEG study was done with scalp electrodes positioned according to the 10-20 International system of electrode placement. Electrical activity was reviewed with band pass filter of 1-'70Hz'$ , sensitivity of 7 uV/mm, display speed of 81m/sec with a '60Hz'$  notched filter applied as appropriate. EEG data were recorded continuously and digitally stored.  Video monitoring was available and reviewed as appropriate.  Description: No clear posterior dominant rhythm was seen. Sleep was characterized by sleep spindles (12 to 14 Hz), maximal frontocentral region. EEG showed continuous generalized 3 to 6 Hz theta-delta slowing. Hyperventilation and photic stimulation were not performed.   ABNORMALITY - Continuous slow, generalized  IMPRESSION: This study is suggestive of moderate to severe diffuse encephalopathy, nonspecific etiology. No seizures  were seen throughout the recording.   PLora Havens  CT Angio Abd/Pel w/ and/or w/o  Result Date: 06/24/2022 CLINICAL DATA:  57year old female with history of epigastric discomfort. History of multiple myeloma. * Tracking Code: BO * EXAM: CTA ABDOMEN AND PELVIS WITHOUT AND WITH CONTRAST TECHNIQUE: Multidetector CT imaging of the abdomen and pelvis was performed using the standard protocol during bolus administration of intravenous contrast. Multiplanar reconstructed images and MIPs were obtained and reviewed to evaluate the vascular anatomy. RADIATION DOSE REDUCTION: This exam was performed according to the departmental  dose-optimization program which includes automated exposure control, adjustment of the mA and/or kV according to patient size and/or use of iterative reconstruction technique. CONTRAST:  737mOMNIPAQUE IOHEXOL 350 MG/ML SOLN COMPARISON:  CT of the abdomen and pelvis 04/30/2022. FINDINGS: Comment: Portions of today's examination are limited by considerable patient motion. VASCULAR Aorta: Atherosclerosis  throughout the abdominal aorta. Normal caliber aorta without aneurysm, dissection, vasculitis or significant stenosis. Celiac: Patent without evidence of aneurysm, dissection, vasculitis or significant stenosis. SMA: Patent without evidence of aneurysm, dissection, vasculitis or significant stenosis. Renals: Both renal arteries are patent without evidence of aneurysm, dissection, vasculitis, fibromuscular dysplasia or significant stenosis. IMA: Patent without evidence of aneurysm, dissection, vasculitis or significant stenosis. Inflow: Patent without evidence of aneurysm, dissection, vasculitis or significant stenosis. Proximal Outflow: Bilateral common femoral and visualized portions of the superficial and profunda femoral arteries are patent without evidence of aneurysm, dissection, vasculitis or significant stenosis. Veins: No obvious venous abnormality within the limitations of this arterial phase study. Review of the MIP images confirms the above findings. NON-VASCULAR Lower chest: Scattered areas of scarring are noted throughout the lung bases bilaterally. Hepatobiliary: Severe diffuse low attenuation throughout the hepatic parenchyma, indicative of a background of severe hepatic steatosis. There are multiple nodular appearing areas within the liver which appear spared from hepatic steatosis on precontrast images. Assessment for enhancement within these lesions is challenging on today's limited CT examination, and accordingly, these lesions are incompletely characterized, largest of which is in the right lobe of  the liver (axial image 25 of series 11) measuring 1.7 cm in diameter. No intra or extrahepatic biliary ductal dilatation. Gallbladder is moderately distended. Gallbladder wall thickness is normal. No pericholecystic fluid or surrounding inflammatory changes. No calcified gallstones are noted in the lumen of the gallbladder. Pancreas: No pancreatic mass. No pancreatic ductal dilatation. No pancreatic or peripancreatic fluid collections or inflammatory changes. Spleen: Unremarkable. Adrenals/Urinary Tract: 6 mm nonobstructive calculus in the right renal collecting system. No additional calculi are noted within the left renal collecting system, along the course of either ureter, or within the lumen of the urinary bladder. Bilateral kidneys and adrenal glands are normal in appearance. No hydroureteronephrosis. Urinary bladder is nearly decompressed around an indwelling Foley balloon catheter. Small amount of gas non dependently within the lumen of the urinary bladder is iatrogenic. Stomach/Bowel: The appearance of the stomach is normal. No pathologic dilatation of small bowel or colon. Normal appendix. Lymphatic: No lymphadenopathy noted in the abdomen or pelvis. Reproductive: Status post hysterectomy.  Ovaries are atrophic. Other: No significant volume of ascites.  No pneumoperitoneum. Musculoskeletal: Areas of mixed lucency and sclerosis are noted throughout the visualized axial and appendicular skeleton, likely reflective of numerous osseous lesions from patient's known multiple myeloma. IMPRESSION: VASCULAR 1. No acute vascular abnormality in the abdomen or pelvis. 2. Aortic atherosclerosis. NON-VASCULAR 1. Severe hepatic steatosis. Multiple liver lesions poorly evaluated on today's CT examination, favored to represent areas of focal fatty sparing, however, underlying neoplasm is not excluded. Follow-up evaluation with nonemergent outpatient abdominal MRI with and without IV gadolinium is recommended in the near  future to characterize these lesions and exclude neoplasm. 2. 6 mm nonobstructive calculus in the right renal collecting system. No ureteral stones or findings of urinary tract obstruction. 3. Multiple osseous lesions, presumably reflective of patient's known multiple myeloma. 4. Additional incidental findings, as above. These results will be called to the ordering clinician or representative by the Radiologist Assistant, and communication documented in the PACS or [employer]. Electronically Signed   By: Vinnie Langton M.D.   On: 06/24/2022 07:48   Overnight EEG with video  Result Date: 06/20/2022 Derek Jack, MD     06/21/2022  7:17 AM Patient Name: Manuela Schwartz Epilepsy Attending: Su Monks MD Referring Physician/Provider: Dr. Kerney Elbe Duration: 7035 on 06/19/22 to 1701 on  06/20/22  Patient history:  ZELIA YZAGUIRRE is a 57 y.o. female with a history of seizure who is undergoing an EEG to evaluate for seizures.  Level of alertness: awake and asleep  Technical aspects: This EEG study was done with scalp electrodes positioned according to the 10-20 International system of electrode placement. Electrical activity was reviewed with band pass filter of 1-'70Hz'$ , sensitivity of 7 uV/mm, display speed of 46m/sec with a '60Hz'$  notched filter applied as appropriate. EEG data were recorded continuously and digitally stored.  Video monitoring was available and reviewed as appropriate.  Description: The best background was 4-5 Hz. This activity is reactive to stimulation. Drowsiness was manifested by background fragmentation; deeper stages of sleep were identified by K complexes and sleep spindles. There was focal slowing over the left hemisphere. Throughout the recording patient had continuous L sided lateralized periodic discharges at 0.5-1.0 Hz with no sustained evolution. There were no definitive electrographic seizures during this recording. Photic stimulation and hyperventilation were not  performed. [facility] event button [facility]. There was no change in background before, during, or after the event.   ABNORMALITY & CLINICAL CORRELATION - continuous L sided lateralized periodic discharges at 0.5-1.0 Hz with no sustained evolution therefore not meeting criteria for electrographic seizure. Thes pattern is however high on the ictal-interictal continuum with high risk for seizure. - moderate diffuse slowing indicative of global cerebral dysfunction - left focal slowing indicative of focal cerebral dysfunction in that region Neurology will be notified of these findings. Recommend continuation of LTM. CSu Monks MD [facility] [phone] 7pm- 7am, please page neurology on call as listed in [place]    EEG adult  Result Date: 06/20/2022 SDerek Jack MD     06/20/2022  7:03 AM Routine EEG Report REVA GRIFFOis a 57y.o. female with a history of seizure who is undergoing an EEG to evaluate for seizures. Report: This EEG was acquired with electrodes placed according to the International 10-20 electrode system (including Fp1, Fp2, F3, F4, C3, C4, P3, P4, O1, O2, T3, T4, T5, T6, A1, A2, Fz, Cz, Pz). The following electrodes were missing or displaced: none. The best background was 6 Hz with superimposed L focal slowing. This activity is reactive to stimulation. Drowsiness was manifested by background fragmentation; deeper stages of sleep were not identified. There were continuous L LPDs, at times 0.5-1 Hz, and at times accelerating to 2-3 Hz with subtle evolution concerning for possible seizure (2-3 times in this routine recording). Photic stimulation and hyperventilation were not performed. Impression and clinical correlation: This EEG was obtained while awake and drowsy and is abnormal due to: - moderate diffuse slowing indicative of global cerebral dysfunction - Continuous L lateralized periodic discharges, at times 0.5-1 Hz, and at times  accelerating to 2-3 Hz with subtle evolution concerning for possible seizure. This pattern is on the ictal-interictal continuum with high risk for seizures. Prolonged EEG for further characterization is recommended. CSu Monks MD [facility] [phone] 7pm- 7am, please page neurology on call as listed in [place]   ECHOCARDIOGRAM COMPLETE BUBBLE STUDY  Result Date: 06/19/2022    ECHOCARDIOGRAM REPORT   Patient Name:   RNASIA CANNANDate of Exam: 06/19/2022 Medical Rec #:  [id number]       Height:       64.0 in Accession #:    [id number]      Weight:       241.2 lb Date  of Birth:  Apr 26, 1966        BSA:          2.118 m Patient Age:    62 years         BP:           161/92 mmHg Patient Gender: F                HR:           86 bpm. Exam Location:  [facility] Procedure: 2D Echo, Cardiac Doppler, Color Doppler and Saline Contrast Bubble            Study Indications:    Stroke  History:        Patient has prior history of Echocardiogram examinations, most                 recent 01/16/2022. Stroke; Risk Factors:Hypertension, Diabetes,                 Dyslipidemia and Former Smoker. Multiple myleoma, Port-a-cath in                 place.  Sonographer:    Wenda Low Referring Phys: [id number] JAN A MANSY  Sonographer Comments: Patient is obese. IMPRESSIONS  1. Left ventricular ejection fraction, by estimation, is 60 to 65%. The left ventricle has normal function. The left ventricle has no regional wall motion abnormalities. There is moderate asymmetric left ventricular hypertrophy of the septal segment. Left ventricular diastolic parameters are consistent with Grade I diastolic dysfunction (impaired relaxation).  2. Right ventricular systolic function is normal. The right ventricular size is normal. There is normal pulmonary artery systolic pressure. The estimated right ventricular systolic pressure is 81.2 mmHg.  3. The mitral valve is grossly normal. Trivial mitral valve regurgitation.  4.  The aortic valve is tricuspid. Aortic valve regurgitation is mild. Aortic regurgitation PHT measures 603 msec.  5. Aortic dilatation noted. There is mild dilatation of the ascending aorta, measuring 41 mm.  6. The inferior vena cava is normal in size with <50% respiratory variability, suggesting right atrial pressure of 8 mmHg.  7. Agitated saline contrast bubble study was negative, with no evidence of right to left interatrial shunt. Comparison(s): Prior images reviewed side by side. LVEF remains normal range at 60-65%. Mildly dilated ascending aorta with mild aortic regurgitation. FINDINGS  Left Ventricle: Left ventricular ejection fraction, by estimation, is 60 to 65%. The left ventricle has normal function. The left ventricle has no regional wall motion abnormalities. The left ventricular internal cavity size was normal in size. There is  moderate asymmetric left ventricular hypertrophy of the septal segment. Left ventricular diastolic parameters are consistent with Grade I diastolic dysfunction (impaired relaxation). Right Ventricle: The right ventricular size is normal. No increase in right ventricular wall thickness. Right ventricular systolic function is normal. There is normal pulmonary artery systolic pressure. The tricuspid regurgitant velocity is 2.20 m/s, and  with an assumed right atrial pressure of 8 mmHg, the estimated right ventricular systolic pressure is 75.1 mmHg. Left Atrium: Left atrial size was normal in size. Right Atrium: Right atrial size was normal in size. Pericardium: There is no evidence of pericardial effusion. Mitral Valve: The mitral valve is grossly normal. Trivial mitral valve regurgitation. MV peak gradient, 4.1 mmHg. The mean mitral valve gradient is 2.0 mmHg. Tricuspid Valve: The tricuspid valve is grossly normal. Tricuspid valve regurgitation is trivial. Aortic Valve: The aortic valve is tricuspid. Aortic valve regurgitation is mild. Aortic regurgitation  PHT measures 603 msec.  Aortic valve mean gradient measures 5.0 mmHg. Aortic valve peak gradient measures 12.4 mmHg. Aortic valve area, by VTI measures 2.95 cm. Pulmonic Valve: The pulmonic valve was grossly normal. Pulmonic valve regurgitation is trivial. Aorta: The aortic root is normal in size and structure and aortic dilatation noted. There is mild dilatation of the ascending aorta, measuring 41 mm. Venous: The inferior vena cava is normal in size with less than 50% respiratory variability, suggesting right atrial pressure of 8 mmHg. IAS/Shunts: No atrial level shunt detected by color flow Doppler. Agitated saline contrast was given intravenously to evaluate for intracardiac shunting. Agitated saline contrast bubble study was negative, with no evidence of any interatrial shunt.  LEFT VENTRICLE PLAX 2D LVIDd:         5.20 cm   Diastology LVIDs:         3.30 cm   LV e' medial:    5.22 cm/s LV PW:         1.30 cm   LV E/e' medial:  15.2 LV IVS:        1.50 cm   LV e' lateral:   7.62 cm/s LVOT diam:     2.10 cm   LV E/e' lateral: 10.4 LV SV:         93 LV SV Index:   44 LVOT Area:     3.46 cm  RIGHT VENTRICLE RV Basal diam:  2.70 cm RV Mid diam:    2.20 cm RV S prime:     14.80 cm/s LEFT ATRIUM             Index        RIGHT ATRIUM           Index LA diam:        4.30 cm 2.03 cm/m   RA Area:     13.20 cm LA Vol (A2C):   62.0 ml 29.27 ml/m  RA Volume:   24.40 ml  11.52 ml/m LA Vol (A4C):   46.4 ml 21.90 ml/m LA Biplane Vol: 54.2 ml 25.59 ml/m  AORTIC VALVE                    PULMONIC VALVE AV Area (Vmax):    2.81 cm     PV Vmax:       1.29 m/s AV Area (Vmean):   3.00 cm     PV Peak grad:  6.7 mmHg AV Area (VTI):     2.95 cm AV Vmax:           176.00 cm/s AV Vmean:          95.700 cm/s AV VTI:            0.315 m AV Peak Grad:      12.4 mmHg AV Mean Grad:      5.0 mmHg LVOT Vmax:         143.00 cm/s LVOT Vmean:        83.000 cm/s LVOT VTI:          0.268 m LVOT/AV VTI ratio: 0.85 AI PHT:            603 msec  AORTA Ao Root diam:  3.70 cm Ao Asc diam:  4.10 cm MITRAL VALVE                TRICUSPID VALVE MV Area (PHT): 5.06 cm     TR Peak grad:   19.4 mmHg MV Area VTI:  3.14 cm     TR Vmax:        220.00 cm/s MV Peak grad:  4.1 mmHg MV Mean grad:  2.0 mmHg     SHUNTS MV Vmax:       1.01 m/s     Systemic VTI:  0.27 m MV Vmean:      69.0 cm/s    Systemic Diam: 2.10 cm MV Decel Time: 150 msec MV E velocity: 79.50 cm/s MV A velocity: 114.00 cm/s MV E/A ratio:  0.70 Rozann Lesches MD Electronically signed by Rozann Lesches MD Signature Date/Time: 06/19/2022/10:34:16 AM    Final    MR BRAIN W WO CONTRAST  Result Date: 06/18/2022 CLINICAL DATA:  Provided history: Neuro deficit, acute, stroke suspected. Mental status change, unknown cause. EXAM: MRI HEAD WITHOUT AND WITH CONTRAST MRA HEAD WITHOUT CONTRAST TECHNIQUE: Multiplanar, multi-echo pulse sequences of the brain and surrounding structures were acquired without and with intravenous contrast. Angiographic images of the Circle of Willis were acquired using MRA technique without intravenous contrast. CONTRAST:  27m GADAVIST GADOBUTROL 1 MMOL/ML IV SOLN COMPARISON:  Brain MRI 06/12/2022. CT angiogram head/neck 12/26/2019. FINDINGS: MRI HEAD FINDINGS Intermittently motion degraded examination, limiting evaluation. Most notably, the axial T2 FLAIR sequence is moderately motion degraded, the coronal T2 and T2 FLAIR sequences through the hippocampi are mild to moderately motion degraded and the sagittal T1 weighted postcontrast sequence is moderately motion degraded. Brain: No age advanced or lobar predominant parenchymal atrophy. Redemonstrated moderate-sized chronic cortical/subcortical infarct within the left parietal lobe (posterior left MCA territory and left MCA/PCA watershed territory). Chronic hemosiderin deposition within portions of this infarct territory. New from the prior brain MRI of 06/12/2022, there is cortical diffusion-weighted signal abnormality along the margins of this  chronic infarct (within the right parietal lobe, at the left temporoparietal junction and within the lateral left occipital lobe) (for instance as seen on series 9, images 17 and 23). Subtle diffusion weighted signal abnormality is also suspected within the posteromedial left thalamus (series 9, image 17). Findings are suspicious for seizure related changes, although acute infarcts are also a consideration. Redemonstrated tiny foci of diffusion-weighted signal abnormality and T2 FLAIR hyperintense signal abnormality measuring up to 3 mm within the anterior right corona radiata (series 11, image 17) and left occipital lobe white matter (series 9, image 16). These were present on the prior brain MRI of 06/12/2022, and likely reflect small subacute infarcts (right MCA territory and left PCA territory, respectively). New 2 mm acute white matter infarct immediately lateral to the right caudate nucleus (series 9, image 19). Background multifocal T2 FLAIR hyperintense signal abnormality within the cerebral white matter (mild) and within the pons (moderate). Unchanged small chronic infarcts within the bilateral cerebellar hemispheres. No evidence of an intracranial mass. No extra-axial fluid collection. No midline shift. No definite pathologic intracranial enhancement. Vascular: Maintained flow voids within the proximal large arterial vessels. Skull and upper cervical spine: Heterogeneous bone marrow signal with numerous foci of osseous enhancement. These findings are compatible with widespread multiple myeloma. Incompletely assessed cervical spondylosis. Sinuses/Orbits: No mass or acute finding within the imaged orbits. No significant paranasal sinus disease. Other: Trace fluid within the bilateral mastoid air cells. MRA HEAD FINDINGS Mildly motion degraded exam. Anterior circulation: The intracranial internal carotid arteries are patent. Mild atherosclerotic irregularity of both vessels. The M1 middle cerebral arteries  are patent. Atherosclerotic irregularity of the M2 and more distal MCA vessels, bilaterally. No M2 proximal branch occlusion or high-grade proximal stenosis. The anterior cerebral  arteries are patent. Atherosclerotic irregularity of both vessels. Most notably, there is an apparent moderate/severe stenosis within the proximal right ACA A2 segment, which is new from the prior CTA of 12/26/2019. Posterior circulation: The intracranial vertebral arteries are patent. The basilar artery is patent. Mild atherosclerotic irregularity of the basilar artery. The posterior cerebral arteries are patent. Atherosclerotic irregularity of both vessels. Most notably, there are sites of up to moderate stenosis within left PCA branches at P2/P3 junction, progressed (series 1033, image 2) and a progressive moderate stenosis within a right PCA branch at the P2/P3 junction. A left posterior communicating artery is present. The right posterior communicating artery is diminutive or absent Anatomic variants: As described. MRI brain impression #2 and #3 will be called to the ordering clinician or representative by the Radiologist Assistant, and communication documented in the PACS or [employer]. IMPRESSION: MRI brain: 1. Intermittently motion degraded exam. 2. Foci of cortical diffusion-weighted signal abnormality along the margins of a chronic left parietal lobe infarct (within the left parietal lobe, at the left temporoparietal junction and within the lateral left occipital lobe). Subtle diffusion-weighted signal abnormality is also suspected within the posteromedial left thalamus. These findings are suspicious for seizure-related changes. However, alternatively, these may reflect acute infarcts. 3. New 2 mm acute white matter infarct immediately lateral to the right caudate head. 4. Redemonstrated tiny subacute infarcts within the anterior right corona radiata and left occipital lobe white matter (right MCA and left PCA vascular  territories, respectively). 5. Background chronic small vessel ischemic changes, as described and unchanged from the recent prior brain MRI of 06/12/2022. 6. Redemonstrated small chronic infarcts within both cerebellar hemispheres. 7. Widespread multiple myeloma. MRA head: 1. No intracranial large vessel occlusion is identified. 2. Intracranial atherosclerotic disease with multifocal stenoses, most notably as follows. 3. Moderate/severe stenosis within the right ACA proximal A2 segment, new from the prior CTA head/neck of 12/26/2019. 4. Sites of up to moderate stenosis within left PCA branches at the P2/P3 junction, progressed. 5. Moderate stenosis within a right PCA branch at the P2/P3 junction, progressed. Electronically Signed   By: Kellie Simmering D.O.   On: 06/18/2022 19:39   MR ANGIO HEAD WO CONTRAST  Result Date: 06/18/2022 CLINICAL DATA:  Provided history: Neuro deficit, acute, stroke suspected. Mental status change, unknown cause. EXAM: MRI HEAD WITHOUT AND WITH CONTRAST MRA HEAD WITHOUT CONTRAST TECHNIQUE: Multiplanar, multi-echo pulse sequences of the brain and surrounding structures were acquired without and with intravenous contrast. Angiographic images of the Circle of Willis were acquired using MRA technique without intravenous contrast. CONTRAST:  13m GADAVIST GADOBUTROL 1 MMOL/ML IV SOLN COMPARISON:  Brain MRI 06/12/2022. CT angiogram head/neck 12/26/2019. FINDINGS: MRI HEAD FINDINGS Intermittently motion degraded examination, limiting evaluation. Most notably, the axial T2 FLAIR sequence is moderately motion degraded, the coronal T2 and T2 FLAIR sequences through the hippocampi are mild to moderately motion degraded and the sagittal T1 weighted postcontrast sequence is moderately motion degraded. Brain: No age advanced or lobar predominant parenchymal atrophy. Redemonstrated moderate-sized chronic cortical/subcortical infarct within the left parietal lobe (posterior left MCA territory and left  MCA/PCA watershed territory). Chronic hemosiderin deposition within portions of this infarct territory. New from the prior brain MRI of 06/12/2022, there is cortical diffusion-weighted signal abnormality along the margins of this chronic infarct (within the right parietal lobe, at the left temporoparietal junction and within the lateral left occipital lobe) (for instance as seen on series 9, images 17 and 23). Subtle diffusion weighted signal abnormality is also  suspected within the posteromedial left thalamus (series 9, image 17). Findings are suspicious for seizure related changes, although acute infarcts are also a consideration. Redemonstrated tiny foci of diffusion-weighted signal abnormality and T2 FLAIR hyperintense signal abnormality measuring up to 3 mm within the anterior right corona radiata (series 11, image 17) and left occipital lobe white matter (series 9, image 16). These were present on the prior brain MRI of 06/12/2022, and likely reflect small subacute infarcts (right MCA territory and left PCA territory, respectively). New 2 mm acute white matter infarct immediately lateral to the right caudate nucleus (series 9, image 19). Background multifocal T2 FLAIR hyperintense signal abnormality within the cerebral white matter (mild) and within the pons (moderate). Unchanged small chronic infarcts within the bilateral cerebellar hemispheres. No evidence of an intracranial mass. No extra-axial fluid collection. No midline shift. No definite pathologic intracranial enhancement. Vascular: Maintained flow voids within the proximal large arterial vessels. Skull and upper cervical spine: Heterogeneous bone marrow signal with numerous foci of osseous enhancement. These findings are compatible with widespread multiple myeloma. Incompletely assessed cervical spondylosis. Sinuses/Orbits: No mass or acute finding within the imaged orbits. No significant paranasal sinus disease. Other: Trace fluid within the  bilateral mastoid air cells. MRA HEAD FINDINGS Mildly motion degraded exam. Anterior circulation: The intracranial internal carotid arteries are patent. Mild atherosclerotic irregularity of both vessels. The M1 middle cerebral arteries are patent. Atherosclerotic irregularity of the M2 and more distal MCA vessels, bilaterally. No M2 proximal branch occlusion or high-grade proximal stenosis. The anterior cerebral arteries are patent. Atherosclerotic irregularity of both vessels. Most notably, there is an apparent moderate/severe stenosis within the proximal right ACA A2 segment, which is new from the prior CTA of 12/26/2019. Posterior circulation: The intracranial vertebral arteries are patent. The basilar artery is patent. Mild atherosclerotic irregularity of the basilar artery. The posterior cerebral arteries are patent. Atherosclerotic irregularity of both vessels. Most notably, there are sites of up to moderate stenosis within left PCA branches at P2/P3 junction, progressed (series 1033, image 2) and a progressive moderate stenosis within a right PCA branch at the P2/P3 junction. A left posterior communicating artery is present. The right posterior communicating artery is diminutive or absent Anatomic variants: As described. MRI brain impression #2 and #3 will be called to the ordering clinician or representative by the Radiologist Assistant, and communication documented in the PACS or [employer]. IMPRESSION: MRI brain: 1. Intermittently motion degraded exam. 2. Foci of cortical diffusion-weighted signal abnormality along the margins of a chronic left parietal lobe infarct (within the left parietal lobe, at the left temporoparietal junction and within the lateral left occipital lobe). Subtle diffusion-weighted signal abnormality is also suspected within the posteromedial left thalamus. These findings are suspicious for seizure-related changes. However, alternatively, these may reflect acute infarcts. 3. New  2 mm acute white matter infarct immediately lateral to the right caudate head. 4. Redemonstrated tiny subacute infarcts within the anterior right corona radiata and left occipital lobe white matter (right MCA and left PCA vascular territories, respectively). 5. Background chronic small vessel ischemic changes, as described and unchanged from the recent prior brain MRI of 06/12/2022. 6. Redemonstrated small chronic infarcts within both cerebellar hemispheres. 7. Widespread multiple myeloma. MRA head: 1. No intracranial large vessel occlusion is identified. 2. Intracranial atherosclerotic disease with multifocal stenoses, most notably as follows. 3. Moderate/severe stenosis within the right ACA proximal A2 segment, new from the prior CTA head/neck of 12/26/2019. 4. Sites of up to moderate stenosis within left PCA  branches at the P2/P3 junction, progressed. 5. Moderate stenosis within a right PCA branch at the P2/P3 junction, progressed. Electronically Signed   By: Kellie Simmering D.O.   On: 06/18/2022 19:39     ASSESSMENT:  1.  IgG lambda multiple myeloma, stage III, del 17 p: -4 cycles of RVD from 11/01/2017 through 01/14/2018. -Stem cell transplant on 02/20/2018 at [facility]. -Maintenance Velcade every 2 weeks and Revlimid 10 mg 3 weeks on/1 week off started on 07/29/2018. -Myeloma panel from 12/08/2018 shows M spike not observed.  Kappa light chains of 32.1 with ratio of 1.64.  Immunofixation was negative. 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  MRI of the brain on 12/27/2019 shows acute ischemic nonhemorrhagic left MCA territory infarct involving left parietal lobe, corresponding with perfusion deficit on CT scan angiogram.  No associated hemorrhage or mass-effect.  Additional few scattered punctate acute ischemic nonhemorrhagic infarcts involving bilateral frontal and parietal lobes as well as left cerebellum. - Revlimid was on hold since June 2021 due to potential for contributing to CVA. - Velcade dose was reduced  to 1 mg per metered square on 12/28/2020 due to worsening neuropathy in the feet.,  Pomalidomide 2 mg 3 weeks on/1 week off started around 09/20/2021, discontinued on 01/17/2022 due to progression. - Daratumumab, carfilzomib and dexamethasone (DKd) started on 01/25/2022, cycle 3 on 04/05/2022 progression after 3 cycles. - I have contacted Dr. Laverta Baltimore at [facility] for BiTE/CAR-T therapies. - Selinexor 80 mg day 1, 3 weekly with dexamethasone 20 mg twice weekly started on 05/12/2022.  Last dose on 06/04/2022, held due to severe thrombocytopenia and anemia.   2.  CVA with aphasia: -We held her myeloma treatments since CVA with aphasia on 12/26/2019. -Her aphasia is improving.   PLAN:  1.  IgG lambda multiple myeloma, stage III, del 17 p: - She has been off of selinexor.  I have reviewed recent hospitalization records. - Reviewed labs today which showed hemoglobin 8.2 and platelet count 29.  Does not require any transfusion as she is not actively bleeding. - Creatinine is elevated at 1.13.  She will receive 2 final mL of normal saline with magnesium minute. - Will send myeloma labs today. - She is not oriented to the place or time.  I will also obtain MRI of the brain with and without contrast. - Will schedule follow-up in 2 weeks. - At this time she does not have any good functional status to tolerate treatments for her myeloma.   2.  Hypomagnesemia: - Magnesium is low at 1.1.  Will give her 4 g of magnesium today.  Will also give her K-Dur 20 mEq p.o. today.   3.  Bone strengthening agents: - Will hold Zometa at this time.   4.  Peripheral neuropathy: - Continue Lyrica 200 mg twice daily.   5.  Back pain: - She is taking oxycodone half tablet as needed.  Last dose was 2 nights ago.   6.  Intractable nausea/vomiting - Continue Reglan as needed.   7.  Recurrent UTIs: - She is on Macrobid daily.  We have reviewed UA today because of dysuria.  Nitrate and leukocyte are negative but many  bacteria present.  We will give her Cipro 500 mg twice daily for 5 days.   Orders placed this encounter:  Orders Placed This Encounter  Procedures   MR [place], [place] [phone]

## 2022-07-18 NOTE — Progress Notes (Signed)
CRITICAL VALUE ALERT Critical value received:  platelets 29 Date of notification:  07-18-2022 Time of notification: 2248 Critical value read back:  Yes.   Nurse who received alert:  B.Khameron Gruenwald RN.  MD notified time and response:  Dr. Delton Coombes @ 1145am .

## 2022-07-18 NOTE — Progress Notes (Signed)
Patient tolerated hydration therapy and magnesium. Port flushed with good blood return noted. No bruising or swelling at site. Bandaid applied and patient discharged in satisfactory condition. VVS stable with no signs or symptoms of distressed noted.

## 2022-07-18 NOTE — Patient Instructions (Signed)
[facility]  Discharge Instructions: Thank you for choosing [facility] to provide your oncology and hematology care.  If you have a lab appointment with the [facility], please come in thru the Main Entrance and check in at the main information desk.  Wear comfortable clothing and clothing appropriate for easy access to any Portacath or PICC line.   We strive to give you quality time with your provider. You may need to reschedule your appointment if you arrive late (15 or more minutes).  Arriving late affects you and other patients whose appointments are after yours.  Also, if you miss three or more appointments without notifying the office, you may be dismissed from the clinic at the provider's discretion.      For prescription refill requests, have your pharmacy contact our office and allow 72 hours for refills to be completed.    Today you received the following normal saline and 4g of magnesium per provider orders.    To help prevent nausea and vomiting after your treatment, we encourage you to take your nausea medication as directed.  BELOW ARE SYMPTOMS THAT SHOULD BE REPORTED IMMEDIATELY: *FEVER GREATER THAN 100.4 F (38 C) OR HIGHER *CHILLS OR SWEATING *NAUSEA AND VOMITING THAT IS NOT CONTROLLED WITH YOUR NAUSEA MEDICATION *UNUSUAL SHORTNESS OF BREATH *UNUSUAL BRUISING OR BLEEDING *URINARY PROBLEMS (pain or burning when urinating, or frequent urination) *BOWEL PROBLEMS (unusual diarrhea, constipation, pain near the anus) TENDERNESS IN MOUTH AND THROAT WITH OR WITHOUT PRESENCE OF ULCERS (sore throat, sores in mouth, or a toothache) UNUSUAL RASH, SWELLING OR PAIN  UNUSUAL VAGINAL DISCHARGE OR ITCHING   Items with * indicate a potential emergency and should be followed up as soon as possible or go to the Emergency Department if any problems should occur.  Please show the CHEMOTHERAPY ALERT CARD or IMMUNOTHERAPY ALERT CARD at check-in to the  Emergency Department and triage nurse.  Should you have questions after your visit or need to cancel or reschedule your appointment, please contact [facility] [phone]  and follow the prompts.  Office hours are 8:00 a.m. to 4:30 p.m. Monday - Friday. Please note that voicemails left after 4:00 p.m. may not be returned until the following business day.  We are closed weekends and major holidays. You have access to a nurse at all times for urgent questions. Please call the main number to the clinic [phone] and follow the prompts.  For any non-urgent questions, you may also contact your provider using MyChart. We now offer e-Visits for anyone 49 and older to request care online for non-urgent symptoms. For details visit [email or web address].   Also download the MyChart app! Go to the app store, search "MyChart", open the app, select , and log in with your MyChart username and password.

## 2022-07-18 NOTE — Progress Notes (Signed)
Message received from A. Beckie Salts / Dr. Delton Coombes to infuse 4 grams of Magnesium Sulfate and 500 ml of normal saline over 1 hr. Patient unable to void in nuns cap.

## 2022-07-19 ENCOUNTER — Encounter (HOSPITAL_COMMUNITY): Payer: Self-pay

## 2022-07-19 ENCOUNTER — Emergency Department (HOSPITAL_COMMUNITY): Payer: BC Managed Care – PPO

## 2022-07-19 ENCOUNTER — Other Ambulatory Visit: Payer: Self-pay

## 2022-07-19 ENCOUNTER — Other Ambulatory Visit (HOSPITAL_COMMUNITY): Payer: BC Managed Care – PPO

## 2022-07-19 ENCOUNTER — Other Ambulatory Visit (HOSPITAL_COMMUNITY): Payer: Self-pay | Admitting: Radiology

## 2022-07-19 ENCOUNTER — Ambulatory Visit: Payer: BC Managed Care – PPO | Admitting: Internal Medicine

## 2022-07-19 ENCOUNTER — Inpatient Hospital Stay (HOSPITAL_COMMUNITY)
Admission: EM | Admit: 2022-07-19 | Discharge: 2022-08-09 | DRG: 871 | Disposition: E | Payer: BC Managed Care – PPO | Attending: Internal Medicine | Admitting: Internal Medicine

## 2022-07-19 ENCOUNTER — Encounter (HOSPITAL_COMMUNITY): Payer: Self-pay | Admitting: *Deleted

## 2022-07-19 DIAGNOSIS — S065XAA Traumatic subdural hemorrhage with loss of consciousness status unknown, initial encounter: Secondary | ICD-10-CM | POA: Diagnosis present

## 2022-07-19 DIAGNOSIS — Z8616 Personal history of COVID-19: Secondary | ICD-10-CM | POA: Diagnosis not present

## 2022-07-19 DIAGNOSIS — E876 Hypokalemia: Secondary | ICD-10-CM | POA: Diagnosis present

## 2022-07-19 DIAGNOSIS — Z66 Do not resuscitate: Secondary | ICD-10-CM | POA: Diagnosis present

## 2022-07-19 DIAGNOSIS — E669 Obesity, unspecified: Secondary | ICD-10-CM | POA: Diagnosis present

## 2022-07-19 DIAGNOSIS — E8809 Other disorders of plasma-protein metabolism, not elsewhere classified: Secondary | ICD-10-CM | POA: Diagnosis present

## 2022-07-19 DIAGNOSIS — Z9481 Bone marrow transplant status: Secondary | ICD-10-CM | POA: Diagnosis not present

## 2022-07-19 DIAGNOSIS — N179 Acute kidney failure, unspecified: Secondary | ICD-10-CM | POA: Diagnosis present

## 2022-07-19 DIAGNOSIS — C9000 Multiple myeloma not having achieved remission: Secondary | ICD-10-CM | POA: Diagnosis present

## 2022-07-19 DIAGNOSIS — E7220 Disorder of urea cycle metabolism, unspecified: Secondary | ICD-10-CM | POA: Diagnosis present

## 2022-07-19 DIAGNOSIS — K760 Fatty (change of) liver, not elsewhere classified: Secondary | ICD-10-CM | POA: Diagnosis present

## 2022-07-19 DIAGNOSIS — G9341 Metabolic encephalopathy: Secondary | ICD-10-CM | POA: Diagnosis present

## 2022-07-19 DIAGNOSIS — Z7984 Long term (current) use of oral hypoglycemic drugs: Secondary | ICD-10-CM

## 2022-07-19 DIAGNOSIS — Z6841 Body Mass Index (BMI) 40.0 and over, adult: Secondary | ICD-10-CM | POA: Diagnosis not present

## 2022-07-19 DIAGNOSIS — I6201 Nontraumatic acute subdural hemorrhage: Secondary | ICD-10-CM | POA: Diagnosis present

## 2022-07-19 DIAGNOSIS — N390 Urinary tract infection, site not specified: Secondary | ICD-10-CM | POA: Diagnosis present

## 2022-07-19 DIAGNOSIS — I619 Nontraumatic intracerebral hemorrhage, unspecified: Secondary | ICD-10-CM

## 2022-07-19 DIAGNOSIS — C787 Secondary malignant neoplasm of liver and intrahepatic bile duct: Secondary | ICD-10-CM | POA: Diagnosis present

## 2022-07-19 DIAGNOSIS — D6959 Other secondary thrombocytopenia: Secondary | ICD-10-CM | POA: Diagnosis present

## 2022-07-19 DIAGNOSIS — E119 Type 2 diabetes mellitus without complications: Secondary | ICD-10-CM | POA: Diagnosis present

## 2022-07-19 DIAGNOSIS — Z833 Family history of diabetes mellitus: Secondary | ICD-10-CM

## 2022-07-19 DIAGNOSIS — I69320 Aphasia following cerebral infarction: Secondary | ICD-10-CM | POA: Diagnosis not present

## 2022-07-19 DIAGNOSIS — D689 Coagulation defect, unspecified: Secondary | ICD-10-CM | POA: Diagnosis present

## 2022-07-19 DIAGNOSIS — Z1152 Encounter for screening for COVID-19: Secondary | ICD-10-CM

## 2022-07-19 DIAGNOSIS — K219 Gastro-esophageal reflux disease without esophagitis: Secondary | ICD-10-CM | POA: Diagnosis present

## 2022-07-19 DIAGNOSIS — E872 Acidosis, unspecified: Secondary | ICD-10-CM | POA: Diagnosis present

## 2022-07-19 DIAGNOSIS — Z8249 Family history of ischemic heart disease and other diseases of the circulatory system: Secondary | ICD-10-CM

## 2022-07-19 DIAGNOSIS — Z825 Family history of asthma and other chronic lower respiratory diseases: Secondary | ICD-10-CM

## 2022-07-19 DIAGNOSIS — Z87891 Personal history of nicotine dependence: Secondary | ICD-10-CM

## 2022-07-19 DIAGNOSIS — I5032 Chronic diastolic (congestive) heart failure: Secondary | ICD-10-CM | POA: Diagnosis present

## 2022-07-19 DIAGNOSIS — A419 Sepsis, unspecified organism: Secondary | ICD-10-CM | POA: Diagnosis present

## 2022-07-19 DIAGNOSIS — R4182 Altered mental status, unspecified: Principal | ICD-10-CM

## 2022-07-19 DIAGNOSIS — D630 Anemia in neoplastic disease: Secondary | ICD-10-CM | POA: Diagnosis present

## 2022-07-19 DIAGNOSIS — Z515 Encounter for palliative care: Secondary | ICD-10-CM

## 2022-07-19 DIAGNOSIS — G40909 Epilepsy, unspecified, not intractable, without status epilepticus: Secondary | ICD-10-CM | POA: Diagnosis present

## 2022-07-19 DIAGNOSIS — Z7982 Long term (current) use of aspirin: Secondary | ICD-10-CM

## 2022-07-19 DIAGNOSIS — Z809 Family history of malignant neoplasm, unspecified: Secondary | ICD-10-CM

## 2022-07-19 DIAGNOSIS — Z9221 Personal history of antineoplastic chemotherapy: Secondary | ICD-10-CM

## 2022-07-19 DIAGNOSIS — M109 Gout, unspecified: Secondary | ICD-10-CM | POA: Diagnosis present

## 2022-07-19 DIAGNOSIS — Z79899 Other long term (current) drug therapy: Secondary | ICD-10-CM

## 2022-07-19 LAB — BLOOD GAS, VENOUS
Acid-Base Excess: 4.1 mmol/L — ABNORMAL HIGH (ref 0.0–2.0)
Bicarbonate: 28.5 mmol/L — ABNORMAL HIGH (ref 20.0–28.0)
Drawn by: 7478
O2 Saturation: 41.9 %
Patient temperature: 35.6
pCO2, Ven: 39 mmHg — ABNORMAL LOW (ref 44–60)
pH, Ven: 7.47 — ABNORMAL HIGH (ref 7.25–7.43)
pO2, Ven: <31 mmHg — CL (ref 32–45)

## 2022-07-19 LAB — I-STAT CHEM 8, ED
BUN: 28 mg/dL — ABNORMAL HIGH (ref 6–20)
Calcium, Ion: 1.04 mmol/L — ABNORMAL LOW (ref 1.15–1.40)
Chloride: 100 mmol/L (ref 98–111)
Creatinine, Ser: 1.1 mg/dL — ABNORMAL HIGH (ref 0.44–1.00)
Glucose, Bld: 85 mg/dL (ref 70–99)
HCT: 19 % — ABNORMAL LOW (ref 36.0–46.0)
Hemoglobin: 6.5 g/dL — CL (ref 12.0–15.0)
Potassium: 3.3 mmol/L — ABNORMAL LOW (ref 3.5–5.1)
Sodium: 138 mmol/L (ref 135–145)
TCO2: 24 mmol/L (ref 22–32)

## 2022-07-19 LAB — COMPREHENSIVE METABOLIC PANEL
ALT: 32 U/L (ref 0–44)
AST: 40 U/L (ref 15–41)
Albumin: 2.2 g/dL — ABNORMAL LOW (ref 3.5–5.0)
Alkaline Phosphatase: 193 U/L — ABNORMAL HIGH (ref 38–126)
Anion gap: 11 (ref 5–15)
BUN: 31 mg/dL — ABNORMAL HIGH (ref 6–20)
CO2: 25 mmol/L (ref 22–32)
Calcium: 7.6 mg/dL — ABNORMAL LOW (ref 8.9–10.3)
Chloride: 102 mmol/L (ref 98–111)
Creatinine, Ser: 1.05 mg/dL — ABNORMAL HIGH (ref 0.44–1.00)
GFR, Estimated: >60 mL/min (ref >60)
Glucose, Bld: 91 mg/dL (ref 70–99)
Potassium: 3.3 mmol/L — ABNORMAL LOW (ref 3.5–5.1)
Sodium: 138 mmol/L (ref 135–145)
Total Bilirubin: 2 mg/dL — ABNORMAL HIGH (ref 0.3–1.2)
Total Protein: 4.8 g/dL — ABNORMAL LOW (ref 6.5–8.1)

## 2022-07-19 LAB — CBC WITH DIFFERENTIAL/PLATELET
Abs Immature Granulocytes: 0.26 10*3/uL — ABNORMAL HIGH (ref 0.00–0.07)
Basophils Absolute: 0 10*3/uL (ref 0.0–0.1)
Basophils Relative: 0 %
Eosinophils Absolute: 0 10*3/uL (ref 0.0–0.5)
Eosinophils Relative: 0 %
HCT: 20 % — ABNORMAL LOW (ref 36.0–46.0)
Hemoglobin: 6.4 g/dL — CL (ref 12.0–15.0)
Immature Granulocytes: 3 %
Lymphocytes Relative: 20 %
Lymphs Abs: 1.7 10*3/uL (ref 0.7–4.0)
MCH: 30.3 pg (ref 26.0–34.0)
MCHC: 32 g/dL (ref 30.0–36.0)
MCV: 94.8 fL (ref 80.0–100.0)
Monocytes Absolute: 1.3 10*3/uL — ABNORMAL HIGH (ref 0.1–1.0)
Monocytes Relative: 17 %
Neutro Abs: 4.8 10*3/uL (ref 1.7–7.7)
Neutrophils Relative %: 60 %
Platelets: 23 10*3/uL — CL (ref 150–400)
RBC: 2.11 MIL/uL — ABNORMAL LOW (ref 3.87–5.11)
RDW: 19.5 % — ABNORMAL HIGH (ref 11.5–15.5)
WBC: 8.1 10*3/uL (ref 4.0–10.5)
nRBC: 0.5 % — ABNORMAL HIGH (ref 0.0–0.2)

## 2022-07-19 LAB — URINALYSIS, ROUTINE W REFLEX MICROSCOPIC
Bilirubin Urine: NEGATIVE
Glucose, UA: NEGATIVE mg/dL
Hgb urine dipstick: NEGATIVE
Ketones, ur: NEGATIVE mg/dL
Leukocytes,Ua: NEGATIVE
Nitrite: POSITIVE — AB
Protein, ur: 30 mg/dL — AB
Specific Gravity, Urine: 1.02 (ref 1.005–1.030)
pH: 5 (ref 5.0–8.0)

## 2022-07-19 LAB — AMMONIA: Ammonia: 93 umol/L — ABNORMAL HIGH (ref 9–35)

## 2022-07-19 LAB — DIC (DISSEMINATED INTRAVASCULAR COAGULATION)PANEL
D-Dimer, Quant: 1.04 ug/mL-FEU — ABNORMAL HIGH (ref 0.00–0.50)
Fibrinogen: 243 mg/dL (ref 210–475)
INR: 1.6 — ABNORMAL HIGH (ref 0.8–1.2)
Platelets: 22 10*3/uL — CL (ref 150–400)
Prothrombin Time: 19.2 seconds — ABNORMAL HIGH (ref 11.4–15.2)
Smear Review: NONE SEEN
aPTT: 40 seconds — ABNORMAL HIGH (ref 24–36)

## 2022-07-19 LAB — TYPE AND SCREEN
ABO/RH(D): A POS
Antibody Screen: POSITIVE
Donor AG Type: NEGATIVE
Donor AG Type: NEGATIVE
Unit division: 0
Unit division: 0

## 2022-07-19 LAB — LACTIC ACID, PLASMA
Lactic Acid, Venous: 2.9 mmol/L (ref 0.5–1.9)
Lactic Acid, Venous: 3 mmol/L (ref 0.5–1.9)
Lactic Acid, Venous: 2.3 mmol/L (ref 0.5–1.9)

## 2022-07-19 LAB — BPAM RBC
Blood Product Expiration Date: 202402112359
Blood Product Expiration Date: 202402112359
Unit Type and Rh: 6200
Unit Type and Rh: 6200

## 2022-07-19 LAB — KAPPA/LAMBDA LIGHT CHAINS
Kappa free light chain: 9.7 mg/L (ref 3.3–19.4)
Kappa, lambda light chain ratio: 0 — ABNORMAL LOW (ref 0.26–1.65)
Lambda free light chains: 2453.3 mg/L — ABNORMAL HIGH (ref 5.7–26.3)

## 2022-07-19 LAB — GLUCOSE, CAPILLARY
Glucose-Capillary: 70 mg/dL (ref 70–99)
Glucose-Capillary: 79 mg/dL (ref 70–99)

## 2022-07-19 LAB — MRSA NEXT GEN BY PCR, NASAL: MRSA by PCR Next Gen: NOT DETECTED

## 2022-07-19 LAB — CBC
HCT: 19.2 % — ABNORMAL LOW (ref 36.0–46.0)
Hemoglobin: 6.2 g/dL — CL (ref 12.0–15.0)
MCH: 30.2 pg (ref 26.0–34.0)
MCHC: 32.3 g/dL (ref 30.0–36.0)
MCV: 93.7 fL (ref 80.0–100.0)
Platelets: 36 10*3/uL — ABNORMAL LOW (ref 150–400)
RBC: 2.05 MIL/uL — ABNORMAL LOW (ref 3.87–5.11)
RDW: 19.5 % — ABNORMAL HIGH (ref 11.5–15.5)
WBC: 8.9 10*3/uL (ref 4.0–10.5)
nRBC: 0.4 % — ABNORMAL HIGH (ref 0.0–0.2)

## 2022-07-19 LAB — PREPARE RBC (CROSSMATCH)

## 2022-07-19 LAB — RESP PANEL BY RT-PCR (RSV, FLU A&B, COVID)  RVPGX2
Influenza A by PCR: NEGATIVE
Influenza B by PCR: NEGATIVE
Resp Syncytial Virus by PCR: NEGATIVE
SARS Coronavirus 2 by RT PCR: NEGATIVE

## 2022-07-19 LAB — RAPID URINE DRUG SCREEN, HOSP PERFORMED
Amphetamines: NOT DETECTED
Barbiturates: POSITIVE — AB
Benzodiazepines: NOT DETECTED
Cocaine: NOT DETECTED
Opiates: NOT DETECTED
Tetrahydrocannabinol: NOT DETECTED

## 2022-07-19 LAB — BRAIN NATRIURETIC PEPTIDE: B Natriuretic Peptide: 230.7 pg/mL — ABNORMAL HIGH (ref 0.0–100.0)

## 2022-07-19 LAB — PROTIME-INR
INR: 1.6 — ABNORMAL HIGH (ref 0.8–1.2)
Prothrombin Time: 18.6 seconds — ABNORMAL HIGH (ref 11.4–15.2)

## 2022-07-19 LAB — CBG MONITORING, ED
Glucose-Capillary: 92 mg/dL (ref 70–99)
Glucose-Capillary: 57 mg/dL — ABNORMAL LOW (ref 70–99)

## 2022-07-19 MED ORDER — POLYETHYLENE GLYCOL 3350 17 G PO PACK: 17.0000 g | PACK | Freq: Every day | ORAL | Status: DC | PRN

## 2022-07-19 MED ORDER — MORPHINE SULFATE (PF) 2 MG/ML IV SOLN
1.0000 mg | INTRAVENOUS | Status: DC | PRN
  Administered 2022-07-19 – 2022-07-20 (×5): 2 mg via INTRAVENOUS
  Filled 2022-07-19 (×5): qty 1

## 2022-07-19 MED ORDER — SODIUM CHLORIDE 0.9 % IV SOLN
2.0000 g | INTRAVENOUS | Status: DC
  Administered 2022-07-19: 2 g via INTRAVENOUS
  Filled 2022-07-19: qty 20

## 2022-07-19 MED ORDER — ORAL CARE MOUTH RINSE: 15.0000 mL | OROMUCOSAL | Status: DC | PRN

## 2022-07-19 MED ORDER — SODIUM CHLORIDE 0.9 % IV SOLN: 2.0000 g | Freq: Three times a day (TID) | INTRAVENOUS | Status: DC

## 2022-07-19 MED ORDER — DEXTROSE 50 % IV SOLN
1.0000 | Freq: Once | INTRAVENOUS | Status: AC
  Administered 2022-07-19: 50 mL via INTRAVENOUS

## 2022-07-19 MED ORDER — VANCOMYCIN HCL 2000 MG/400ML IV SOLN
2000.0000 mg | Freq: Once | INTRAVENOUS | Status: AC
  Administered 2022-07-19: 2000 mg via INTRAVENOUS
  Filled 2022-07-19: qty 400

## 2022-07-19 MED ORDER — SODIUM CHLORIDE 0.9 % IV BOLUS
1000.0000 mL | Freq: Once | INTRAVENOUS | Status: AC
  Administered 2022-07-19: 1000 mL via INTRAVENOUS

## 2022-07-19 MED ORDER — SODIUM CHLORIDE 0.9% IV SOLUTION: Freq: Once | INTRAVENOUS | Status: DC

## 2022-07-19 MED ORDER — HALOPERIDOL LACTATE 5 MG/ML IJ SOLN
5.0000 mg | Freq: Four times a day (QID) | INTRAMUSCULAR | Status: DC | PRN
  Administered 2022-07-20: 5 mg via INTRAVENOUS
  Filled 2022-07-19: qty 1

## 2022-07-19 MED ORDER — LACTULOSE ENEMA
300.0000 mL | Freq: Two times a day (BID) | ORAL | Status: DC
  Administered 2022-07-19 – 2022-07-20 (×2): 300 mL via RECTAL
  Filled 2022-07-19 (×4): qty 300

## 2022-07-19 MED ORDER — METRONIDAZOLE 500 MG/100ML IV SOLN
500.0000 mg | Freq: Once | INTRAVENOUS | Status: AC
  Administered 2022-07-19: 500 mg via INTRAVENOUS
  Filled 2022-07-19: qty 100

## 2022-07-19 MED ORDER — POTASSIUM CHLORIDE 10 MEQ/100ML IV SOLN
10.0000 meq | INTRAVENOUS | Status: AC
  Administered 2022-07-19 – 2022-07-20 (×4): 10 meq via INTRAVENOUS
  Filled 2022-07-19 (×4): qty 100

## 2022-07-19 MED ORDER — SODIUM CHLORIDE 0.9 % IV SOLN: INTRAVENOUS | Status: DC | PRN

## 2022-07-19 MED ORDER — CHLORHEXIDINE GLUCONATE CLOTH 2 % EX PADS
6.0000 | MEDICATED_PAD | Freq: Every day | CUTANEOUS | Status: DC
  Administered 2022-07-20 (×2): 6 via TOPICAL

## 2022-07-19 MED ORDER — ONDANSETRON HCL 4 MG/2ML IJ SOLN: 4.0000 mg | Freq: Four times a day (QID) | INTRAMUSCULAR | Status: DC | PRN

## 2022-07-19 MED ORDER — PHENYLEPHRINE 80 MCG/ML (10ML) SYRINGE FOR IV PUSH (FOR BLOOD PRESSURE SUPPORT)
80.0000 ug | PREFILLED_SYRINGE | Freq: Once | INTRAVENOUS | Status: AC
  Administered 2022-07-19: 80 ug via INTRAVENOUS
  Filled 2022-07-19: qty 10

## 2022-07-19 MED ORDER — LORAZEPAM 2 MG/ML PO CONC: 1.0000 mg | Freq: Once | ORAL | Status: DC

## 2022-07-19 MED ORDER — MAGNESIUM SULFATE 2 GM/50ML IV SOLN
2.0000 g | Freq: Once | INTRAVENOUS | Status: AC
  Administered 2022-07-19: 2 g via INTRAVENOUS
  Filled 2022-07-19: qty 50

## 2022-07-19 MED ORDER — LACTULOSE ENEMA
300.0000 mL | Freq: Once | ORAL | Status: AC
  Administered 2022-07-19: 300 mL via RECTAL
  Filled 2022-07-19: qty 300

## 2022-07-19 MED ORDER — LORAZEPAM 2 MG/ML IJ SOLN
1.0000 mg | INTRAMUSCULAR | Status: AC
  Administered 2022-07-19: 1 mg via INTRAVENOUS
  Filled 2022-07-19: qty 1

## 2022-07-19 MED ORDER — SODIUM CHLORIDE 0.9% IV SOLUTION
Freq: Once | INTRAVENOUS | Status: AC
  Administered 2022-07-19: 10 mL/h via INTRAVENOUS

## 2022-07-19 MED ORDER — LORAZEPAM 2 MG/ML IJ SOLN
0.5000 mg | Freq: Once | INTRAMUSCULAR | Status: AC
  Administered 2022-07-19: 0.5 mg via INTRAVENOUS
  Filled 2022-07-19: qty 1

## 2022-07-19 MED ORDER — PHENYLEPHRINE 80 MCG/ML (10ML) SYRINGE FOR IV PUSH (FOR BLOOD PRESSURE SUPPORT)
80.0000 ug | PREFILLED_SYRINGE | Freq: Once | INTRAVENOUS | Status: DC | PRN
  Filled 2022-07-19: qty 10

## 2022-07-19 MED ORDER — ORAL CARE MOUTH RINSE
15.0000 mL | OROMUCOSAL | Status: DC
  Administered 2022-07-19 – 2022-07-20 (×2): 15 mL via OROMUCOSAL

## 2022-07-19 MED ORDER — DIAZEPAM 5 MG/ML IJ SOLN
2.5000 mg | Freq: Three times a day (TID) | INTRAMUSCULAR | Status: DC | PRN
  Administered 2022-07-19: 2.5 mg via INTRAVENOUS
  Filled 2022-07-19: qty 2

## 2022-07-19 MED ORDER — DOCUSATE SODIUM 100 MG PO CAPS: 100.0000 mg | ORAL_CAPSULE | Freq: Two times a day (BID) | ORAL | Status: DC | PRN

## 2022-07-19 MED ORDER — DEXTROSE 50 % IV SOLN
INTRAVENOUS | Status: AC
  Filled 2022-07-19: qty 50

## 2022-07-19 MED ORDER — SODIUM CHLORIDE 0.9 % IV SOLN
2.0000 g | Freq: Once | INTRAVENOUS | Status: AC
  Administered 2022-07-19: 2 g via INTRAVENOUS
  Filled 2022-07-19: qty 12.5

## 2022-07-19 MED ORDER — IOHEXOL 300 MG/ML  SOLN
100.0000 mL | Freq: Once | INTRAMUSCULAR | Status: AC | PRN
  Administered 2022-07-19: 100 mL via INTRAVENOUS

## 2022-07-19 NOTE — H&P (Addendum)
NAME:  Joanna Reid, MRN:  [id number], DOB:  01-16-1966, LOS: 0 ADMISSION DATE:  07/18/2022, CONSULTATION DATE:  07/18/2022 REFERRING MD:  [facility] EM, CHIEF COMPLAINT:  AMS   History of Present Illness:   56 yo F PMH multiple myeloma, CVA, seizure/SE (hospitalized 06/18/22-07/11/22), anemia/thrombocytopenia who presented to [facility] ED 07/30/2022 with AMS. CT H revealed small L SDH, labs with anemia (6.4) and thrombocytopenia (23), hyperammonemia (91). A CT c/a/p was also acquired which shows incr size of multifocal lesions in liver   Was seen at cancer center 1/10 -- sounds like at that time pt husband shared she has been lethargic since starting AEDs, and was confused. UA/Ucx were acquired & was started on Cipro for possible UTI (takes daily macrobid for recurrent UTIs) and an MRI brain was ordered. She was thrombocytopenic (29) and anemic (8.2) at that time, but in absence of bleed was not transfused.  Was given 2L IVF for her AKI and mag as well.   Ucx from 1/10 with >100,000 GNR  Pertinent  Medical History  Multiple myeloma Seizure Stroke   Significant Hospital Events: Including procedures, antibiotic start and stop dates in addition to other pertinent events   1/10 outpt cancer center appt. Started on cipro for possible UTI  1/11 [facility] for AMS. Spontaneous SDH. Receiving plt for thrombocytopenia in setting of bleed. Abx to cefepime for UTI (GNR). Transfer to [facility]    Interim History / Subjective:  As above   Objective   Blood pressure 114/74, pulse 90, temperature 98 F (36.7 C), temperature source Oral, resp. rate 20, height '5\' 4"'$  (1.626 m), SpO2 99 %.        Intake/Output Summary (Last 24 hours) at 07/18/2022 1317 Last data filed at 07/23/2022 1303 Gross per 24 hour  Intake 2474 ml  Output --  Net 2474 ml   There were no vitals filed for this visit.  Examination: General: Acute on chronically ill appearing elderly female lying in bed, in NAD HEENT: [place]/AT, MM pink/moist, PERRL,   Neuro: Unable to determine orientation as patient is yelling/groaning continuously, appears nonfocal CV: s1s2 regular rate and rhythm, no murmur, rubs, or gallops,  PULM: Difficult to auscultate given vocalizations but no added breath sounds, no increased work of breathing GI: soft, bowel sounds active in all 4 quadrants, non-tender, non-distended Extremities: warm/dry, 2+ pitting lower extremity edema  Skin: No added bruising all over body  Resolved Hospital Problem list     Assessment & Plan:   Acute encephalopathy - probably multifactorial Acute Spontaneous L SDH Seizure disorder Hx CVA w residual aphasia  -UTI, hyperammonemia, AED side effects P: NSGY consult by EDP with no reccomandations for no interventions at this time   Keppra, lacosamide Minimize CNS depressing meds (holding home oxy, lyrica)  Management per neurology  Maintain neuro protective measures; goal for eurothermia, euglycemia, eunatermia, normoxia, and PCO2 goal of 35-40 Nutrition and bowel regiment  Seizure precautions  Aspirations precautions   Multiple myeloma stg III Hepatic lesions  -Had dz progression with daratumumab, carfilzomib, dexamethsone. Selinexor held with low plt.  -In last onc note, sounds like functional status precludes tx for myeloma.  P: Myeloma labs were ordered at outpt appointment 1/10 -- follow up Supportive care   Acute on chronic anemia Acute on chronic thrombocytopenia Coagulopathy  -Hemoglobin today at 9 AM 6.42 units of packed red blood cells was ordered at [facility] but unfortunately patient has multiple antibodies and cross match was not able to be  obtained prior to transfer.  Patient will need new type and screen here, order placed. -Saved 1 unit of platelets prior to transfer -INR on admit 1.6 P: Repeat type and screen Will also repeat CBC to help determine amount of needed transfusion.  Patient is currently hemodynamically stable Trend CBC Hemoglobin goal greater  than 7 Monitor for signs of bleeding Transfuse for platelets less than 10  Elevated alk phos  Hyperammonemia Hyperbilirubinemia  Hepatic steatosis, severe  -no biliary ductal dilation, normal GB  P: Lactulose enemas until oral access can be obtained Trend LFTs Avoid hepatotoxins  AKI  -Creatinine 0.69 with GFR greater than 60 on 07/11/22, creatinine 1.13 with GFR 57 on admission 1/11 P Follow renal function  Monitor urine output Trend Bmet Avoid nephrotoxins Ensure adequate renal perfusion   Urinary tract infection -UA positive for nitrates and many bacteria P: IV ceftriaxone Follow culture  History of diastolic congestive heart failure -Echocardiogram December 2023 revealed EF 60 to 65% with no regional wall motion abnormality and evidence of grade 1 diastolic dysfunction P: Continuous telemetry  Obtain BNP Strict intake and output  Daily weight to assess volume status Daily assessment for need to diures Closely monitor renal function and electrolytes  Ensure hemodynamic control   Lactic acidosis  P: Follow  Hypokalemia Hypocalcemia Hypomagnesemia  P: Trend bmet Supplement as needed  Best Practice (right click and "Reselect all SmartList Selections" daily)   Diet/type: NPO DVT prophylaxis: SCD GI prophylaxis: PPI Lines: N/A Foley:  N/A Code Status:  full code Last date of multidisciplinary goals of care discussion: Pending   Labs   CBC: Recent Labs  Lab 07/18/22 1058 07/31/2022 0928 08/01/2022 1009 07/12/2022 1129  WBC 9.1 8.1  --   --   NEUTROABS 5.1 4.8  --   --   HGB 8.2* 6.4* 6.5*  --   HCT 25.5* 20.0* 19.0*  --   MCV 94.4 94.8  --   --   PLT 29* 23*  --  22*    Basic Metabolic Panel: Recent Labs  Lab 07/18/22 1058 07/31/2022 0928 07/31/2022 1009  NA 136 138 138  K 3.3* 3.3* 3.3*  CL 98 102 100  CO2 24 25  --   GLUCOSE 104* 91 85  BUN 32* 31* 28*  CREATININE 1.13* 1.05* 1.10*  CALCIUM 8.1* 7.6*  --   MG 1.1*  --   --     GFR: Estimated Creatinine Clearance: 68.5 mL/min (A) (by C-G formula based on SCr of 1.1 mg/dL (H)). Recent Labs  Lab 07/18/22 1058 07/16/2022 0928 07/17/2022 0941 07/10/2022 1129  WBC 9.1 8.1  --   --   LATICACIDVEN  --   --  2.9* 3.0*    Liver Function Tests: Recent Labs  Lab 07/18/22 1058 07/30/2022 0928  AST 31 40  ALT 27 32  ALKPHOS 240* 193*  BILITOT 1.9* 2.0*  PROT 5.3* 4.8*  ALBUMIN 2.4* 2.2*   No results for input(s): "LIPASE", "AMYLASE" in the last 168 hours. Recent Labs  Lab 07/16/2022 1129  AMMONIA 93*    ABG    Component Value Date/Time   HCO3 28.5 (H) 07/17/2022 1129   TCO2 24 07/31/2022 1009   O2SAT 41.9 07/11/2022 1129     Coagulation Profile: Recent Labs  Lab 07/10/2022 0928 07/21/2022 1129  INR 1.6* 1.6*    Cardiac Enzymes: No results for input(s): "CKTOTAL", "CKMB", "CKMBINDEX", "TROPONINI" in the last 168 hours.  HbA1C: HbA1c, POC (prediabetic range)  Date/Time Value Ref  Range Status  08/07/2021 10:27 AM 6.1 5.7 - 6.4 % Final   HbA1c, POC (controlled diabetic range)  Date/Time Value Ref Range Status  08/07/2021 10:27 AM 6.1 0.0 - 7.0 % Final   Hgb A1c MFr Bld  Date/Time Value Ref Range Status  06/19/2022 04:16 AM 7.2 (H) 4.8 - 5.6 % Final    Comment:    (NOTE)         Prediabetes: 5.7 - 6.4         Diabetes: >6.4         Glycemic control for adults with diabetes: <7.0   01/30/2022 09:00 AM 6.6 (H) 4.8 - 5.6 % Final    Comment:             Prediabetes: 5.7 - 6.4          Diabetes: >6.4          Glycemic control for adults with diabetes: <7.0     CBG: Recent Labs  Lab 08/01/2022 0944  GLUCAP 92    Review of Systems:   Unable to assess   Past Medical History:  She,  has a past medical history of Acid reflux, Allergic rhinitis, Cancer ([place]), Diabetes mellitus, Gout, Gout, H/o COVID-19--- was Positive 09/17/2019, Negative 12/23/19 AND also Neg on  12/26/19 (12/26/2019), HBP (high blood pressure), History of kidney stones,  Migraines, and Multiple myeloma ([place]) (10/29/2017).   Surgical History:   Past Surgical History:  Procedure Laterality Date   BREAST CYST EXCISION Left    2009 no visible scar on skin   CESAREAN SECTION     COLONOSCOPY WITH PROPOFOL N/A 12/25/2019   Procedure: COLONOSCOPY WITH PROPOFOL;  Surgeon: Rogene Houston, MD;  [facility];  Service: Endoscopy;  Laterality: N/A;  730   EXTRACORPOREAL SHOCK WAVE LITHOTRIPSY Left 10/10/2017   Procedure: LEFT EXTRACORPOREAL SHOCK WAVE LITHOTRIPSY (ESWL);  Surgeon: Bjorn Loser, MD;  Location: [facility] ORS;  Service: Urology;  Laterality: Left;   EYE SURGERY     HEMORRHOID SURGERY N/A 11/19/2012   Procedure: HEMORRHOIDECTOMY;  Surgeon: Jamesetta So, MD;  Location: [facility] ORS;  Service: General;  Laterality: N/A;   IR IMAGING GUIDED PORT INSERTION  01/18/2022   kidney stones  1998   LAPAROSCOPIC UNILATERAL SALPINGO OOPHERECTOMY  05/14/2012   Procedure: LAPAROSCOPIC UNILATERAL SALPINGO OOPHORECTOMY;  Surgeon: [name], MD;  Location: [facility] ORS;  Service: Gynecology;  Laterality: Right;  laparoscopic right salpingo-oophorectomy   PARTIAL HYSTERECTOMY     POLYPECTOMY  12/25/2019   Procedure: POLYPECTOMY;  Surgeon: Rogene Houston, MD;  Location: [facility];  Service: Endoscopy;;   TONSILECTOMY, ADENOIDECTOMY, BILATERAL MYRINGOTOMY AND TUBES     VESICOVAGINAL FISTULA CLOSURE W/ TAH       Social History:   reports that she quit smoking about 23 years ago. Her smoking use included cigarettes. She has never used smokeless tobacco. She reports that she does not drink alcohol and does not use drugs.   Family History:  Her family history includes ALS in her father; Anesthesia problems in her paternal grandfather; Arthritis in an other family member; COPD in her maternal grandmother; Cancer in her maternal grandfather, paternal aunt, and another family member; Dementia in her mother; Diabetes in her brother, father, and another family member;  Hypertension in her brother and mother.   Allergies No Known Allergies   Home Medications  Prior to Admission medications   Medication Sig Start Date End Date Taking? Authorizing Provider  acyclovir (ZOVIRAX) 400  MG tablet TAKE (1) TABLET BY MOUTH TWICE DAILY. 03/01/22  Yes Derek Jack, MD  albuterol (VENTOLIN HFA) 108 (90 Base) MCG/ACT inhaler Inhale 2 puffs into the lungs every 4 (four) hours as needed for wheezing or shortness of breath. 04/16/22  Yes Roxan Hockey, MD  aspirin EC 81 MG tablet Take 1 tablet (81 mg total) by mouth daily with breakfast. For stroke prophylaxis 04/16/22  Yes Emokpae, Courage, MD  carvedilol (COREG) 3.125 MG tablet Take 1 tablet (3.125 mg total) by mouth 2 (two) times daily with a meal. 07/11/22  Yes Bonnielee Haff, MD  ciprofloxacin (CIPRO) 500 MG tablet Take 1 tablet (500 mg total) by mouth 2 (two) times daily. 07/18/22  Yes Derek Jack, MD  diclofenac Sodium (VOLTAREN) 1 % GEL Apply 2 g topically 4 (four) times daily. 05/01/21  Yes Caccavale, Sophia, PA-C  docusate sodium (COLACE) 100 MG capsule Take 100 mg by mouth daily.    Yes [provider]  escitalopram (LEXAPRO) 10 MG tablet TAKE (1) TABLET BY MOUTH ONCE DAILY. MAY START WITH 1/2 TABLET FOR 7 DAYS. 05/03/22  Yes Lindell Spar, MD  fluticasone (FLONASE) 50 MCG/ACT nasal spray INSTILL 2 SPRAYS INTO BOTH NOSTRILS DAILY 06/28/21  Yes Lindell Spar, MD  lacosamide 100 MG TABS Take 1 tablet (100 mg total) by mouth 2 (two) times daily. 07/11/22  Yes Bonnielee Haff, MD  levETIRAcetam (KEPPRA) 1000 MG tablet Take 1 tablet (1,000 mg total) by mouth 2 (two) times daily. 07/11/22  Yes Bonnielee Haff, MD  magnesium oxide (MAG-OX) 400 (241.3 Mg) MG tablet Take 1 tablet (400 mg total) by mouth 3 (three) times daily. 10/02/18  Yes Derek Jack, MD  melatonin 3 MG TABS tablet Take 1 tablet (3 mg total) by mouth at bedtime as needed. 07/11/22  Yes Bonnielee Haff, MD  metFORMIN (GLUCOPHAGE)  500 MG tablet Take 1 tablet (500 mg total) by mouth 2 (two) times daily. 04/16/22 09/10/22 Yes Emokpae, Courage, MD  metoCLOPramide (REGLAN) 10 MG tablet Take 1 tablet (10 mg total) by mouth every 8 (eight) hours as needed for nausea. 05/03/22  Yes Derek Jack, MD  nitrofurantoin, macrocrystal-monohydrate, (MACROBID) 100 MG capsule TAKE (1) CAPSULE BY MOUTH AT BEDTIME. Patient taking differently: Take 100 mg by mouth at bedtime. 03/14/22  Yes Derek Jack, MD  pantoprazole (PROTONIX) 40 MG tablet TAKE (1) TABLET BY MOUTH ONCE DAILY. Patient taking differently: Take 40 mg by mouth daily. 01/17/22  Yes Lindell Spar, MD  pregabalin (LYRICA) 200 MG capsule TAKE (1) CAPSULE BY MOUTH TWICE DAILY. Patient taking differently: Take 200 mg by mouth 2 (two) times daily. 01/25/22  Yes Derek Jack, MD  Propylene Glycol (SYSTANE BALANCE) 0.6 % SOLN Apply 1 drop to eye daily as needed (dry eye).    Yes [provider]  QUEtiapine (SEROQUEL) 25 MG tablet Take 1 tablet (25 mg total) by mouth daily. 07/11/22  Yes Bonnielee Haff, MD  rosuvastatin (CRESTOR) 5 MG tablet Take 1 tablet (5 mg total) by mouth daily. 04/16/22  Yes Emokpae, Courage, MD  selinexor (XPOVIO) Therapy Pack (80 mg twice weekly) Take 4 tablets (80 mg total) by mouth 2 (two) times a week. Take on days 1 and 3 of each week. 05/07/22  Yes Derek Jack, MD  traZODone (DESYREL) 50 MG tablet Take 1 tablet (50 mg total) by mouth at bedtime. 07/11/22  Yes Bonnielee Haff, MD  allopurinol (ZYLOPRIM) 300 MG tablet TAKE (1) TABLET BY MOUTH ONCE DAILY. Patient not taking: Reported on  07/18/2022 05/28/22   Lindell Spar, MD  BD PEN NEEDLE NANO U/F 32G X 4 MM MISC Inject into the skin. 01/31/22   [provider]  dexamethasone (DECADRON) 4 MG tablet Take 5 tablets (20 mg total) by mouth 2 (two) times a week. Take on day 1 and day 3 along with Xpovio. Patient not taking: Reported on 07/17/2022 05/10/22   Derek Jack, MD  lidocaine-prilocaine (EMLA) cream Apply 1 Application topically as needed (Apply to port prior to treatment and flushes). Patient not taking: Reported on 07/18/2022 01/24/22   Derek Jack, MD  oxyCODONE-acetaminophen (PERCOCET/ROXICET) 5-325 MG tablet Take 1 tablet by mouth 2 (two) times daily as needed for severe pain. Patient not taking: Reported on 07/09/2022 05/29/22   Derek Jack, MD  polyethylene glycol (MIRALAX / GLYCOLAX) 17 g packet Take 17 g by mouth daily as needed for mild constipation. 04/16/22   Roxan Hockey, MD  traMADol (ULTRAM) 50 MG tablet Take 1 tablet (50 mg total) by mouth every 8 (eight) hours as needed for severe pain. Patient not taking: Reported on 07/18/2022 04/24/22   Derek Jack, MD     Critical care time:    CRITICAL CARE Performed by: Dexter Sauser D. Harris   Total critical care time: 45 minutes  Critical care time was exclusive of separately billable procedures and treating other patients.  Critical care was necessary to treat or prevent imminent or life-threatening deterioration.  Critical care was time spent personally by me on the following activities: development of treatment plan with patient and/or surrogate as well as nursing, discussions with consultants, evaluation of patient's response to treatment, examination of patient, obtaining history from patient or surrogate, ordering and performing treatments and interventions, ordering and review of laboratory studies, ordering and review of radiographic studies, pulse oximetry and re-evaluation of patient's condition.  Jonique Kulig D. Harris, NP-C [facility] Personal contact information can be found on Amion  If no contact or response made please call [phone] 07/26/2022, 5:57 PM

## 2022-07-19 NOTE — ED Notes (Signed)
Upon this RN's assessment patient is moaning intermittently.She is disoriented. Pressures are stable at this time. Pupils are dilated, equal and reactive (sluggish). Lung sounds clear/diminished bilaterally. She is maintaining SPO2 96% on RA.  NSR with rate in 80's multiform PVC's noted. Indwelling foley in place and working properly. Flexiseal in place and no leakage noted. Abdomen is distended and tender. R chest port accessed. 2 peripherals in L and R AC in placed and working. Husband is at bedside and is supportive. Joanna Reid Joanna Reid

## 2022-07-19 NOTE — Progress Notes (Signed)
-   Three-way phone conference with on-call PCCM attending Dr. Halford Chessman and EDP Ms Theodis Blaze-- -Dr. Halford Chessman recommends admission to [facility] neuro ICU--with PCCM as primary team/attending service and neurosurgical consult -Patient may need continuous EEG -Serial neuroimaging -Transfusions - Further management by EDP and critical care team -Hospitalist service will not be involved at this time -Please call back if hospitalist services are needed  Roxan Hockey, MD

## 2022-07-19 NOTE — Progress Notes (Signed)
Patient's spouse requested that the care team be in contact with Dr. Delton Coombes, the pt's oncologist at [facility] while she is here during this admission. Thanks.   Aleaya Latona, Rande Brunt, RN

## 2022-07-19 NOTE — Progress Notes (Signed)
eLink Physician-Brief Progress Note Patient Name: Joanna Reid DOB: 08/21/1965 MRN: [id number]   Date of Service  07/11/2022  HPI/Events of Note  Multiple issues: 1. Anemia  Thrombocytopenia - Hgb = 6.5 and platelets = 36. Patient has SDH. 2. Lactic Acid = 2.3.  eICU Interventions  Plan: Transfuse 1 unit PRBC. Transfuse 1 unit Single donor platelets. Trend Lactic Acid post PRBC.      Intervention Category Major Interventions: Other:  Lysle Dingwall 07/22/2022, 9:19 PM

## 2022-07-19 NOTE — Progress Notes (Signed)
E-link RN notified of critical hemoglobin and critical lactic acid.

## 2022-07-19 NOTE — ED Triage Notes (Signed)
Pt brought in by [facility] from home with c/o AMS that started this morning. O2 sat 82% on RA for EMS, pt placd on 2L with increase of O2 to 98%. BP 85/36, HR 89 for EMS.  Family reported to EMS that pt hasn't slept in the last 3 days. Pt recently had a urinary catheter in that was removed yesterday. Family reported the urine was dark and foul smelling. Pt recently in [facility] for a stroke on Dec. 11. Unsure of what pt's baseline is. Husband is on his way to the hospital per EMS.

## 2022-07-19 NOTE — Progress Notes (Addendum)
Patient settled into her bed in 4N23. She has been yelling out incomprehensibly. CBG is now 115 after the dextrose given at [facility]. Pt's spouse is now at the bedside and has been updated. He has shared with me that she has been in different levels of confusion and restlessness since December with her last hospital stay. She has not had more than one "good" night's sleep in the past month. Pt's spouse helps patient in and out of the wheelchair and recliner at home but she is ultimately dependent. He states her urine has been foul smelling and dark since December. CCM NP has been to the bedside and has placed new orders. Monitoring closely. Family will let us know if they have any questions. Eufelia Veno, Rande Brunt, RN

## 2022-07-19 NOTE — ED Notes (Signed)
CBG 57 upon carelink arrival. Amp of D50 given. Joanna Reid

## 2022-07-19 NOTE — ED Provider Notes (Signed)
[facility] EMERGENCY DEPARTMENT Provider Note   CSN: [id number] Arrival date & time: 07/17/2022  7425     History  Chief Complaint  Patient presents with   Altered Mental Status    Joanna Reid is a 57 y.o. female. History of multiple myeloma s/p bone marrow transplant on chemotherapy, recent stroke in [id number] complicated by seizures who presents to the emergency department with altered mental status   Level 5 Caveat: AMS - presents altered, combative.   Husband arrived shortly after patient arrival and provides history. States she was here December 11th for AMS and was diagnosed with stroke. States that after stroke she had seizures and was transferred to [facility] for ongoing work up. At that time, she was placed on multiple medications for seizures which caused her to be altered. This was tapered down to 2 medications and she was recently discharged last Wednesday. He states she has been intermittently combative and altered at home. He denies recent seizure activity. States she has had foul smelling urine and not urinating during the day. He denies cough, fever, nausea, vomiting, diarrhea, shortness of breath. Denies recent chemotherapy. Denies bloody diarrhea. Denies steroid use.    Altered Mental Status      Home Medications Prior to Admission medications   Medication Sig Start Date End Date Taking? Authorizing Provider  acyclovir (ZOVIRAX) 400 MG tablet TAKE (1) TABLET BY MOUTH TWICE DAILY. 03/01/22  Yes Derek Jack, MD  albuterol (VENTOLIN HFA) 108 (90 Base) MCG/ACT inhaler Inhale 2 puffs into the lungs every 4 (four) hours as needed for wheezing or shortness of breath. 04/16/22  Yes Roxan Hockey, MD  aspirin EC 81 MG tablet Take 1 tablet (81 mg total) by mouth daily with breakfast. For stroke prophylaxis 04/16/22  Yes Emokpae, Courage, MD  carvedilol (COREG) 3.125 MG tablet Take 1 tablet (3.125 mg total) by mouth 2 (two) times daily with a meal. 07/11/22  Yes  Bonnielee Haff, MD  ciprofloxacin (CIPRO) 500 MG tablet Take 1 tablet (500 mg total) by mouth 2 (two) times daily. 07/18/22  Yes Derek Jack, MD  diclofenac Sodium (VOLTAREN) 1 % GEL Apply 2 g topically 4 (four) times daily. 05/01/21  Yes Caccavale, Sophia, PA-C  docusate sodium (COLACE) 100 MG capsule Take 100 mg by mouth daily.    Yes [provider]  escitalopram (LEXAPRO) 10 MG tablet TAKE (1) TABLET BY MOUTH ONCE DAILY. MAY START WITH 1/2 TABLET FOR 7 DAYS. 05/03/22  Yes Lindell Spar, MD  fluticasone (FLONASE) 50 MCG/ACT nasal spray INSTILL 2 SPRAYS INTO BOTH NOSTRILS DAILY 06/28/21  Yes Lindell Spar, MD  lacosamide 100 MG TABS Take 1 tablet (100 mg total) by mouth 2 (two) times daily. 07/11/22  Yes Bonnielee Haff, MD  levETIRAcetam (KEPPRA) 1000 MG tablet Take 1 tablet (1,000 mg total) by mouth 2 (two) times daily. 07/11/22  Yes Bonnielee Haff, MD  magnesium oxide (MAG-OX) 400 (241.3 Mg) MG tablet Take 1 tablet (400 mg total) by mouth 3 (three) times daily. 10/02/18  Yes Derek Jack, MD  melatonin 3 MG TABS tablet Take 1 tablet (3 mg total) by mouth at bedtime as needed. 07/11/22  Yes Bonnielee Haff, MD  metFORMIN (GLUCOPHAGE) 500 MG tablet Take 1 tablet (500 mg total) by mouth 2 (two) times daily. 04/16/22 09/10/22 Yes Emokpae, Courage, MD  metoCLOPramide (REGLAN) 10 MG tablet Take 1 tablet (10 mg total) by mouth every 8 (eight) hours as needed for nausea. 05/03/22  Yes Derek Jack, MD  nitrofurantoin, macrocrystal-monohydrate, (MACROBID) 100 MG capsule TAKE (1) CAPSULE BY MOUTH AT BEDTIME. Patient taking differently: Take 100 mg by mouth at bedtime. 03/14/22  Yes Derek Jack, MD  pantoprazole (PROTONIX) 40 MG tablet TAKE (1) TABLET BY MOUTH ONCE DAILY. Patient taking differently: Take 40 mg by mouth daily. 01/17/22  Yes Lindell Spar, MD  pregabalin (LYRICA) 200 MG capsule TAKE (1) CAPSULE BY MOUTH TWICE DAILY. Patient taking differently: Take 200  mg by mouth 2 (two) times daily. 01/25/22  Yes Derek Jack, MD  Propylene Glycol (SYSTANE BALANCE) 0.6 % SOLN Apply 1 drop to eye daily as needed (dry eye).    Yes [provider]  QUEtiapine (SEROQUEL) 25 MG tablet Take 1 tablet (25 mg total) by mouth daily. 07/11/22  Yes Bonnielee Haff, MD  rosuvastatin (CRESTOR) 5 MG tablet Take 1 tablet (5 mg total) by mouth daily. 04/16/22  Yes Emokpae, Courage, MD  selinexor (XPOVIO) Therapy Pack (80 mg twice weekly) Take 4 tablets (80 mg total) by mouth 2 (two) times a week. Take on days 1 and 3 of each week. 05/07/22  Yes Derek Jack, MD  traZODone (DESYREL) 50 MG tablet Take 1 tablet (50 mg total) by mouth at bedtime. 07/11/22  Yes Bonnielee Haff, MD  allopurinol (ZYLOPRIM) 300 MG tablet TAKE (1) TABLET BY MOUTH ONCE DAILY. Patient not taking: Reported on 07/29/2022 05/28/22   Lindell Spar, MD  BD PEN NEEDLE NANO U/F 32G X 4 MM MISC Inject into the skin. 01/31/22   [provider]  dexamethasone (DECADRON) 4 MG tablet Take 5 tablets (20 mg total) by mouth 2 (two) times a week. Take on day 1 and day 3 along with Xpovio. Patient not taking: Reported on 08/02/2022 05/10/22   Derek Jack, MD  lidocaine-prilocaine (EMLA) cream Apply 1 Application topically as needed (Apply to port prior to treatment and flushes). Patient not taking: Reported on 07/18/2022 01/24/22   Derek Jack, MD  oxyCODONE-acetaminophen (PERCOCET/ROXICET) 5-325 MG tablet Take 1 tablet by mouth 2 (two) times daily as needed for severe pain. Patient not taking: Reported on 07/18/2022 05/29/22   Derek Jack, MD  polyethylene glycol (MIRALAX / GLYCOLAX) 17 g packet Take 17 g by mouth daily as needed for mild constipation. 04/16/22   Roxan Hockey, MD  traMADol (ULTRAM) 50 MG tablet Take 1 tablet (50 mg total) by mouth every 8 (eight) hours as needed for severe pain. Patient not taking: Reported on 07/16/2022 04/24/22   Derek Jack,  MD      Allergies    Patient has no known allergies.    Review of Systems   Review of Systems  Unable to perform ROS: Mental status change    Physical Exam Updated Vital Signs BP 108/60   Pulse 91   Temp 98.5 F (36.9 C) (Oral)   Resp 19   Ht '5\' 4"'$  (1.626 m)   SpO2 100%   BMI 41.74 kg/m  Physical Exam Vitals and nursing note reviewed.  Constitutional:      General: She is in acute distress.     Appearance: She is obese. She is ill-appearing.  HENT:     Head: Normocephalic.     Mouth/Throat:     Mouth: Mucous membranes are dry.     Pharynx: Oropharynx is clear.  Eyes:     General: No scleral icterus.    Pupils: Pupils are equal, round, and reactive to light.     Comments: Pale conjunctiva   Cardiovascular:  Rate and Rhythm: Normal rate and regular rhythm.     Pulses: Normal pulses.     Heart sounds: No murmur heard. Pulmonary:     Effort: Pulmonary effort is normal. No respiratory distress.     Breath sounds: Normal breath sounds.  Abdominal:     General: Bowel sounds are normal. There is no distension.     Palpations: Abdomen is soft.  Musculoskeletal:     Cervical back: Neck supple.  Skin:    General: Skin is warm and dry.     Capillary Refill: Capillary refill takes less than 2 seconds.     Coloration: Skin is pale.     Findings: Bruising present.  Neurological:     General: No focal deficit present.     Mental Status: She is disoriented and confused.     GCS: GCS eye subscore is 3. GCS verbal subscore is 3. GCS motor subscore is 5.     Motor: Motor function is intact.  Psychiatric:        Speech: Speech is tangential.        Behavior: Behavior is agitated.        Cognition and Memory: Cognition is impaired.     ED Results / Procedures / Treatments   Labs (all labs ordered are listed, but only abnormal results are displayed) Labs Reviewed  COMPREHENSIVE METABOLIC PANEL - Abnormal; Notable for the following components:      Result Value    Potassium 3.3 (*)    BUN 31 (*)    Creatinine, Ser 1.05 (*)    Calcium 7.6 (*)    Total Protein 4.8 (*)    Albumin 2.2 (*)    Alkaline Phosphatase 193 (*)    Total Bilirubin 2.0 (*)    All other components within normal limits  LACTIC ACID, PLASMA - Abnormal; Notable for the following components:   Lactic Acid, Venous 2.9 (*)    All other components within normal limits  LACTIC ACID, PLASMA - Abnormal; Notable for the following components:   Lactic Acid, Venous 3.0 (*)    All other components within normal limits  CBC WITH DIFFERENTIAL/PLATELET - Abnormal; Notable for the following components:   RBC 2.11 (*)    Hemoglobin 6.4 (*)    HCT 20.0 (*)    RDW 19.5 (*)    Platelets 23 (*)    nRBC 0.5 (*)    Monocytes Absolute 1.3 (*)    Abs Immature Granulocytes 0.26 (*)    All other components within normal limits  PROTIME-INR - Abnormal; Notable for the following components:   Prothrombin Time 18.6 (*)    INR 1.6 (*)    All other components within normal limits  URINALYSIS, ROUTINE W REFLEX MICROSCOPIC - Abnormal; Notable for the following components:   Color, Urine AMBER (*)    Protein, ur 30 (*)    Nitrite POSITIVE (*)    Bacteria, UA MANY (*)    All other components within normal limits  BLOOD GAS, VENOUS - Abnormal; Notable for the following components:   pH, Ven 7.47 (*)    pCO2, Ven 39 (*)    pO2, Ven <31 (*)    Bicarbonate 28.5 (*)    Acid-Base Excess 4.1 (*)    All other components within normal limits  AMMONIA - Abnormal; Notable for the following components:   Ammonia 93 (*)    All other components within normal limits  DIC (DISSEMINATED INTRAVASCULAR COAGULATION)PANEL - Abnormal; Notable for the following  components:   Prothrombin Time 19.2 (*)    INR 1.6 (*)    aPTT 40 (*)    D-Dimer, Quant 1.04 (*)    Platelets 22 (*)    All other components within normal limits  I-STAT CHEM 8, ED - Abnormal; Notable for the following components:   Potassium 3.3 (*)    BUN  28 (*)    Creatinine, Ser 1.10 (*)    Calcium, Ion 1.04 (*)    Hemoglobin 6.5 (*)    HCT 19.0 (*)    All other components within normal limits  RESP PANEL BY RT-PCR (RSV, FLU A&B, COVID)  RVPGX2  CULTURE, BLOOD (ROUTINE X 2)  CULTURE, BLOOD (ROUTINE X 2)  URINE CULTURE  RAPID URINE DRUG SCREEN, HOSP PERFORMED  CBG MONITORING, ED  TYPE AND SCREEN  PREPARE RBC (CROSSMATCH)  PREPARE PLATELET PHERESIS    EKG None  Radiology CT CHEST ABDOMEN PELVIS W CONTRAST  Result Date: 07/21/2022 CLINICAL DATA:  History of multiple myeloma with altered mental status and concern for sepsis EXAM: CT CHEST, ABDOMEN, AND PELVIS WITH CONTRAST TECHNIQUE: Multidetector CT imaging of the chest, abdomen and pelvis was performed following the standard protocol during bolus administration of intravenous contrast. RADIATION DOSE REDUCTION: This exam was performed according to the departmental dose-optimization program which includes automated exposure control, adjustment of the mA and/or kV according to patient size and/or use of iterative reconstruction technique. CONTRAST:  112m OMNIPAQUE IOHEXOL 300 MG/ML  SOLN COMPARISON:  CTA abdomen and pelvis dated 06/23/2022, CTA chest dated 04/12/2022 FINDINGS: CT CHEST FINDINGS Moderate motion artifact. Cardiovascular: Right chest wall port terminates at the superior cavoatrial junction. Normal heart size. No significant pericardial fluid/thickening. Coronary artery calcifications. Great vessels are normal in course and caliber. No central pulmonary emboli. Mediastinum/Nodes: Imaged thyroid gland without nodules meeting criteria for imaging follow-up by size. Normal esophagus. No substantial change in size of 11 mm left internal mammary lymph node (2: 20). Lungs/Pleura: The central airways are patent. Patchy ground-glass opacities in the lingula most likely represents subsegmental atelectasis. No focal consolidations. No pneumothorax. No pleural effusion. Musculoskeletal:  Heterogeneously lytic and sclerotic appearance of the axial skeleton with unchanged compression deformity of T6 and superior endplate deformity of TG50 CT ABDOMEN PELVIS FINDINGS Hepatobiliary: Severe hepatic steatosis is again seen. Interval increased size of multifocal hyperattenuating lesions throughout the liver, for example 1.8 cm segment 7 (2:40), previously 1.4 cm and 2.0 cm segment 2 (2:48), previously 1.5 cm. No definite new lesions. no intra or extrahepatic biliary ductal dilation. Normal gallbladder. Pancreas: No focal lesions or main ductal dilation. Spleen: Enlarged, 14.5 cm, previously 13.6 cm. No focal splenic lesions. Adrenals/Urinary Tract: No adrenal nodules. No suspicious renal masses or hydronephrosis. Nonobstructing right renal stones measuring up to 5 mm in the lower pole. Bladder is decompressed with urinary catheter in-situ. Stomach/Bowel: Normal appearance of the stomach. No evidence of bowel wall thickening, distention, or inflammatory changes. Normal appendix. Vascular/Lymphatic: Aortic atherosclerosis. No enlarged abdominal or pelvic lymph nodes. Reproductive: Status post hysterectomy. No adnexal masses. Other: No free fluid, fluid collection, or free air. Musculoskeletal: Similar heterogeneously lytic and sclerotic appearance of the axial and appendicular skeleton similar endplate deformities of L1 and 2. Similar grade 1 anterolisthesis at L4-5. IMPRESSION: 1. Interval increased size of multifocal hyperattenuating lesions throughout the liver, suspicious for worsening metastatic disease. 2. Similar heterogeneously lytic and sclerotic appearance of the axial and appendicular skeleton, likely reflecting the patient's known multiple myeloma. 3. Unchanged enlarged left internal mammary  lymph node, indeterminate. 4. No acute abnormality in the chest, abdomen, or pelvis. 5. Slightly increased splenomegaly. 6. Hepatic steatosis. 7. Nonobstructing right renal stones. 8. Coronary artery  calcifications. Aortic Atherosclerosis (ICD10-I70.0). Electronically Signed   By: Darrin Nipper M.D.   On: 08/06/2022 11:09   CT Head Wo Contrast  Result Date: 07/17/2022 CLINICAL DATA:  Mental status change, unknown cause Brain metastases suspected EXAM: CT HEAD WITHOUT CONTRAST TECHNIQUE: Contiguous axial images were obtained from the base of the skull through the vertex without intravenous contrast. RADIATION DOSE REDUCTION: This exam was performed according to the departmental dose-optimization program which includes automated exposure control, adjustment of the mA and/or kV according to patient size and/or use of iterative reconstruction technique. COMPARISON:  MRI 06/18/2022. FINDINGS: Brain: Small (3 mm) acute/hyperdense subdural hemorrhage along the left frontal convexity. No significant mass effect. Similar area of encephalomalacia in the left parietal lobe. No evidence of acute large vascular territory infarct, midline shift, visible mass lesion or hydrocephalus. Vascular: No hyperdense vessel. Skull: No acute fracture. Sinuses/Orbits: Mild paranasal sinus mucosal thickening. No acute orbital findings. Other: No mastoid effusions. IMPRESSION: 1. Small (3 mm) acute/hyperdense subdural hemorrhage along the left frontal convexity. No significant mass effect. 2. If there is concern for intracranial metastatic disease, recommend MRI with contrast for further evaluation. Findings discussed with Kommor, Madison, MD via telephone at 10:35 a.m. Electronically Signed   By: Margaretha Sheffield M.D.   On: 07/28/2022 10:37    Procedures .Critical Care  Performed by: Mickie Hillier, PA-C Authorized by: Mickie Hillier, PA-C   Critical care provider statement:    Critical care time (minutes):  75   Critical care time was exclusive of:  Separately billable procedures and treating other patients   Critical care was necessary to treat or prevent imminent or life-threatening deterioration of the following  conditions:  CNS failure or compromise, metabolic crisis, shock, sepsis, toxidrome and dehydration   Critical care was time spent personally by me on the following activities:  Development of treatment plan with patient or surrogate, discussions with consultants, discussions with primary provider, evaluation of patient's response to treatment, examination of patient, interpretation of cardiac output measurements, obtaining history from patient or surrogate, review of old charts, re-evaluation of patient's condition, ordering and review of laboratory studies, ordering and review of radiographic studies, pulse oximetry and ordering and performing treatments and interventions   I assumed direction of critical care for this patient from another provider in my specialty: no     Care discussed with: admitting provider      Medications Ordered in ED Medications  PHENYLephrine 80 mcg/ml in normal saline Adult IV Push Syringe (For Blood Pressure Support) (has no administration in time range)  vancomycin (VANCOREADY) IVPB 2000 mg/400 mL (2,000 mg Intravenous New Bag/Given 07/16/2022 1326)  0.9 %  sodium chloride infusion (Manually program via Guardrails IV Fluids) (has no administration in time range)  ceFEPIme (MAXIPIME) 2 g in sodium chloride 0.9 % 100 mL IVPB (has no administration in time range)  sodium chloride 0.9 % bolus 1,000 mL (0 mLs Intravenous Stopped 07/10/2022 1110)  sodium chloride 0.9 % bolus 1,000 mL (0 mLs Intravenous Stopped 08/04/2022 1255)  LORazepam (ATIVAN) injection 1 mg (1 mg Intravenous Given 07/28/2022 1008)  PHENYLephrine 80 mcg/ml in normal saline Adult IV Push Syringe (For Blood Pressure Support) (80 mcg Intravenous Given 08/02/2022 1004)  ceFEPIme (MAXIPIME) 2 g in sodium chloride 0.9 % 100 mL IVPB (0 g Intravenous Stopped 07/28/2022 1158)  metroNIDAZOLE (FLAGYL) IVPB 500 mg (0 mg Intravenous Stopped 07/30/2022 1259)  0.9 %  sodium chloride infusion (Manually program via Guardrails IV Fluids) (10  mL/hr Intravenous New Bag/Given 07/23/2022 1314)  iohexol (OMNIPAQUE) 300 MG/ML solution 100 mL (100 mLs Intravenous Contrast Given 07/27/2022 1038)  LORazepam (ATIVAN) injection 0.5 mg (0.5 mg Intravenous Given 07/14/2022 1213)  lactulose (CHRONULAC) enema 200 gm (300 mLs Rectal Given 08/04/2022 1406)    ED Course/ Medical Decision Making/ A&P Clinical Course as of 08/03/2022 1408  Thu Jul 19, 2022  0959 Given 128mg neosynephrine and initial 0.'5mg'$  ativan for agitation.  [LA]  1018 Hemoglobin 6.2, platelets 23.  No evidence of bleeding at this time.  Transfusing 2 units.  Adding on a DIC panel. [LA]  1035 Small SDH left [MK]  1048 Spontaneous SDH with thrombocytopenia - NSGY consulted, giving platelet transfusion  [LA]  1117 Elsner, NSGY with no further recommendations at this time. No intervention for SDH at this time.  [LA]  151Spoke with Dr. SHalford Chessmanwho [facility] monitoring on 4N.  [LA]  1221 Spoke with Dr. ALynetta Marewho accepts patient for admission.  [LA]    Clinical Course User Index [LA] AMickie Hillier PA-C [MK] Kommor, Madison, MD                           Medical Decision Making Amount and/or Complexity of Data Reviewed Labs: ordered. Radiology: ordered.  Risk Prescription drug management. Decision regarding hospitalization.  Initial Impression and Ddx 57year old female who presents to the emergency department agitated, mildly combative, disoriented.  She is pale on initial exam, not following commands.  She is hypotensive without tachycardia or hypoxia.  Sepsis workup was initiated as well as VBG, ammonia.  Given history of malignancy on chemotherapy obtaining pan scan, starting empiric antibiotics, 2 L of IV fluid.  Additionally she is requiring small dose push of phenylephrine prior to giving Ativan for agitation. Patient PMH that increases complexity of ED encounter: Multiple myeloma hypertension 2  diabetes, stroke  Interpretation of Diagnostics I independent reviewed and interpreted the labs as followed: hemoglobin 6.4, plt 23, UA + UTI, Cr stable, LFTs stable, venous gas without retention, lactic 2.9->3, Bcx pending, ammonia 93  - I independently visualized the following imaging with scope of interpretation limited to determining acute life threatening conditions related to emergency care: CT head, CT CAP, which revealed 342msubdural hemotoma, CAP with liver metastasis otherwise no acute abnormalities  Patient Reassessment and Ultimate Disposition/Management Critically ill-appearing [age]ear old female who presented altered, combative, pale in the setting of known multiple myeloma on chemotherapy, recent stroke with seizures.  She was hypotensive on arrival requiring immediate fluid resuscitation as well as Neo-Synephrine push for blood pressure support.  She is maintaining her airway with 100% O2 sat on room air.  Initial GCS around 11 on arrival.  From has been it sounds that her altered mental status and intermittent agitation is not new at this time.  We obtained broad lab work including sepsis labs, ammonia, coags, type and screen, DIC panel.  CT head does show a new 3 mm subdural hematoma in the setting of thrombocytopenia.  Platelets were transfused. She has critical anemia of 6.2 which is down from 8.2 up yesterday.  She was transfused 2 units of blood.  Consulted and spoke with neurosurgery about the subdural hematoma and talked about Dr. ElEllene Routeho does  not recommend any intervention at this time.  Initial lactic of 2.9.  She does have UTI on UA.  And started empiric sepsis antibiotics with vancomycin, cefepime, metronidazole.   Ammonia is also elevated to 93 in the setting of new liver metastasis.  A Flexi-Seal was placed as well as lactulose enema given.  She has scattered bruising throughout her body.  Given the low platelets ordered a DIC panel which was negative.  She  was also fluid resuscitated and had stabilization of her blood pressure while she was here in the emergency department.  CT abdomen pelvis and chest was ordered given the acuity of her condition and history which was negative for any acute findings.  Consulted and spoke with Dr. Halford Chessman, critical care here at [facility] who recommends her being transition to critical care at [facility] so that she can have continuous EEG monitoring as well as neurochecks given her new subdural hematoma.  Consulted and spoke with Dr. Lynetta Mare who accept the patient for admission.  At this time her altered mental status is multifactorial.  She is acutely anemic, has a subdural hematoma, possible urosepsis with encephalopathy, hepatic encephalopathy with hyperammonemia. History of seizures and apparently the husband has been weaning her off her antiepileptic's at home.  It does not appear that she has had seizure activity here in the emergency department.  Will require neuro consult as well as EEG monitoring.  She was given Ativan here for her agitation which intermittently improves her symptoms.  Admit to critical care   Patient management required discussion with the following services or consulting groups:  Hospitalist Service, Intensivist Service, and Neurosurgery  Complexity of Problems Addressed Acute illness or injury that poses threat of life of bodily function  Additional Data Reviewed and Analyzed Further history obtained from: Further history from spouse/family member, Past medical history and medications listed in the EMR, Prior ED visit notes, Recent PCP notes, and Care Everywhere  Patient Encounter Risk Assessment Use of parenteral controlled substances and Consideration of hospitalization  Final Clinical Impression(s) / ED Diagnoses Final diagnoses:  Altered mental status, unspecified altered mental status type  Subdural hematoma [facility])    Rx / DC Orders ED Discharge Orders     None          Mickie Hillier, PA-C 07/12/2022 1414    Kommor, Debe Coder, MD 07/29/2022 2154

## 2022-07-19 NOTE — ED Notes (Signed)
Report given to [facility]. ETA 15-20 min. Husband was informed. Bryson Corona Edd Fabian

## 2022-07-19 NOTE — Progress Notes (Signed)
Pharmacy Antibiotic Note  Joanna Reid is a 57 y.o. female admitted on 07/28/2022 with sepsis.  Pharmacy has been consulted for cefepime dosing. UCX + GNR in urine, changing abx to cefepime  Plan: Cefepime 2gm IV Q8H F/U cxs and clinical progress Monitor V/S, labs  Height: '5\' 4"'$  (162.6 cm) IBW/kg (Calculated) : 54.7  Temp (24hrs), Avg:97.8 F (36.6 C), Min:96.1 F (35.6 C), Max:98.6 F (37 C)  Recent Labs  Lab 07/18/22 1058 07/23/2022 0928 08/07/2022 0941 08/02/2022 1009 07/11/2022 1129  WBC 9.1 8.1  --   --   --   CREATININE 1.13* 1.05*  --  1.10*  --   LATICACIDVEN  --   --  2.9*  --  3.0*    Estimated Creatinine Clearance: 68.5 mL/min (A) (by C-G formula based on SCr of 1.1 mg/dL (H)).    No Known Allergies  Antimicrobials this admission: Cefepime 1/11 >>  Vancomycin 1/11 x 1 dose  Microbiology results: 1/11 BCX: pending 1/10 UCX (outpt)>started on cipro  >100K CFU/ml GNR  Thank you for allowing pharmacy to be a part of this patient's care.  Isac Sarna, BS Pharm D, BCPS Clinical Pharmacist 07/30/2022 1:30 PM

## 2022-07-19 NOTE — ED Notes (Signed)
[facility] at bedside. Bryson Corona Edd Fabian

## 2022-07-19 NOTE — Progress Notes (Deleted)
[facility] [address] [place]. [place] [address] [phone]       STROKE FOLLOW UP NOTE  [name] Date of Birth:  25-Jan-1966 Medical Record Number:  [id number]   Reason for Referral: stroke follow up    SUBJECTIVE:   CHIEF COMPLAINT:  No chief complaint on file.    HPI:   Update 07/23/2021 JM: Patient is being seen for recent hospital follow-up.  She presented to [facility] ED on 12/11 for headache and lethargy that started the day prior.  Initially suspected to have an infection and has started on vancomycin and cefepime.  While in ED, she had acute mental status change and unarousable with full tonic-clonic seizure lasting 2 to 3 minutes which improved with lorazepam.  She was loaded with Keppra. MRI showed old left parietal lobe infarct and adjacent cortical DWI signal most consistent with cortical edema from prolonged seizure activity.  She was transferred to [facility] for further evaluation with LTM EEG.  After arrival, electrographic seizures were seen on EEG a, complete clinical seizure activity witnessed by staff.  Lacosamide and Dilantin added to Keppra, phenobarbital and pregabalin subsequently started which resulted in improvement of EEG. Of note, MRI brain impression report noted acute infarcts, further reviewed by neurology and was felt to be related to seizure related edema and artifact, unlikely to reflect acute infarct.  MRA negative LVO, intracranial obstructive disease with multifocal stenosis with progression noted since prior imaging.  LTM EEG eventually discontinued 12/19, phenytoin d/c'd. Did need further adjustments to ASM regimen due to lethargy, was also started on Seroquel for agitation and sleep.  Due to persistent confusion and agitation, EEG repeated 12/29 which did not show any seizure activity.  Recommended continued use of Vimpat, Keppra and phenobarbital.  She was eventually discharged home on 1/3 with home health  therapies.  Returned to [facility] ED on 1/11 due to altered mental status with combativeness.     History provided for reference purposes only Update 05/18/2021 JM: Returns for 58-monthstroke follow-up unaccompanied  Overall stable from stroke standpoint -denies new stroke/TIA symptoms Residual mild speech difficulties - can worsen if "placed on the spot", fatigued or anxious. Has been reading more. Still has issues with numbers but learning to compensate   Compliant on aspirin and Crestor -denies side effects Blood pressure today 126/86 Recent A1c 5.4 - DM managed by endocrinology  Continue routine follow-up with oncology for multiple myeloma - Velcade every 2 weeks Experiencing worsening low back pain -oncology completed MR lumbar 03/2021 which showed T12 and L1 fractures likely in setting of multiple myeloma. Has been seen by GCassie Freer(unable to view notes via epic) - recently completed imaging of hips - ortho concerned she may have AVN - has appt next Thursday to review results.   No further concerns at this time  Update 11/10/2020 JM: Ms. HDammreturns for stroke follow-up after prior visit 6 months ago accompanied by her husband  Stable from stroke standpoint without new stroke/TIA symptoms and occasional word finding difficulty typically worse with heightened emotions but otherwise doing well. Also mixes up numbers. No issues with letters   Compliant on aspirin and Crestor without associated side effects Blood pressure today 97/73 - no routinely monitored at home as it has been stable and routinely monitored at cancer center Continues to follow closely with endocrinology for DM management  Continues to follow with oncology for multiple myeloma continuing to receive Velcade every 2 weeks as maintenance as  her disease is noted to currently be in remission.  She also remains on pregabalin 200 mg twice daily for peripheral neuropathy per oncology.  No further concerns at  this time   Update 05/12/2020 JM: [name] returns for stroke follow-up.  She has been doing well since prior visit with ongoing improvement of her speech.  She does notice worsening more so when she is excited or fatigued.  Continues to do exercises routinely at home.  Denies new or worsening stroke/TIA symptoms.  She remains on aspirin 81 mg daily without bleeding or bruising.  Remains on Crestor 10 mg daily without myalgias.  Blood pressure today 140/84.  Continues to follow with endocrinology for DM management.  She continues to follow closely with oncology for multiple myeloma continuing Velcade maintenance with Revlimid on hold since her stroke. Per patient, current myeloma counts satisfactory therefore not planning on restarting Revlimid unless indicated in the future.  She continues to follow with urology for recurrent UTIs.  No further concerns at this time.  Initial visit 01/26/2020 JM: [name] is being seen for hospital follow-up accompanied by her husband.  Residual deficits of expressive aphasia and reading difficulty with ongoing improvement working with outpatient SLP.  Currently wearing cardiac monitor ordered by [facility] cardiology and will be completed at the end of the week.  Apparently was discharged on aspirin 81 mg daily and clopidogrel 75 mg daily and denies bleeding or bruising.  Continues on Crestor 10 mg daily without myalgias.  Blood pressure today 118/72.  Recent change to antihypertensive regimen by cardiology due to continued elevated blood pressures.  Routinely monitors at home.  Continues to follow with endocrinology for diabetic management.  Chemotherapy remains on hold and questioning duration of restarting.  Plans on repeat lab work next week.  No further concerns at this time.  Stroke admission 12/25/2019 [name] is a 57 y.o. female with history of multiple myeloma (s/p stem cell transplant and currently on maintenance Velcade and Revlimid), Covid positive  in March, morbid obesity, DM, HTN, migraines and smoking s/p screening colonoscopy on 12/25/2019. She developed confusion and expressive aphasia on Saturday (12/26/2019).  Eventually transferred to [facility] for further work-up.  Stroke work-up revealed embolic left MCA parietal branch stroke as well scattered punctate bilateral frontal and parietal and left cerebellar infarcts.  CTA no significant large vessel stenosis or occlusion.  Currently on chemotherapy with thrombocytopenia therefore dual platelet not recommended and recommended continuation of aspirin 81 mg.  HTN stable with long-term BP goal normotensive range.  LDL 33 and increased Crestor to 32m daily.  History of DM with A1c 5.7.  Other stroke risk factors include former tobacco use, obesity and migraines.  Other active problems include leukopenia, anemia, thrombocytopenia, hypokalemia, UTI and Covid positive 09/2019.  Advised to ensure follow-up with oncology with chemotherapy possibly placed on hold for a few months during recovery.  No further evaluation including assessment for dysrhythmias and need for anticoagulation due to thrombocytopenia recommended considering in the future with possible 30-day cardiac event monitor.  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   Resultant mild word finding difficulties/mild aphasia. Code Stroke CT Head - Normal CT appearance of the brain. ASPECTS is 10/10.    CT head - not ordered MRI head -small left parietal MCA branch infarct.  Tiny scattered punctate bilateral frontal and parietal and left cerebellar infarcts  MRA head - not ordered CTA H&N -  No emergent large vessel occlusion. Mild narrowing of the mid  basilar artery without other significant proximal stenosis, aneurysm, or branch vessel occlusion. Moderate distal small vessel disease in both the anterior and posterior circulation. Moderate tortuosity of the cervical internal carotid arteries bilaterally, likely secondary to hypertension.  Atherosclerotic changes at the left carotid bifurcation and cavernous internal carotid arteries bilaterally without significant stenosis. CT Perfusion - 24 mL left parietal lobe watershed region area of ischemia without defined core infarct.  Carotid Doppler - CTA neck performed - carotid dopplers not indicated. 2D Echo - EF 60 - 65%. No cardiac source of emboli identified.  [employer] Virus 2 - negative LDL - 107 HgbA1c - 5.7 UDS - benzodiazepine VTE prophylaxis - Kelley Heparin aspirin 81 mg daily prior to admission, now on aspirin 81 mg daily   Patient counseled to be compliant with her antithrombotic medications Ongoing aggressive stroke risk factor management Therapy recommendations: OP SLP Disposition: home       ROS:   14 system review of systems performed and negative with exception of speech difficulty  PMH:  Past Medical History:  Diagnosis Date   Acid reflux    Allergic rhinitis    Cancer ([place])    multiple myeloma   Diabetes mellitus    type 2   Gout    Gout    H/o COVID-19--- was Positive 09/17/2019, Negative 12/23/19 AND also Neg on  12/26/19 12/26/2019   HBP (high blood pressure)    History of kidney stones    Migraines    Multiple myeloma ([place]) 10/29/2017    PSH:  Past Surgical History:  Procedure Laterality Date   BREAST CYST EXCISION Left    2009 no visible scar on skin   CESAREAN SECTION     COLONOSCOPY WITH PROPOFOL N/A 12/25/2019   Procedure: COLONOSCOPY WITH PROPOFOL;  Surgeon: Rogene [place], MD;  Location: [facility];  Service: Endoscopy;  Laterality: N/A;  730   EXTRACORPOREAL SHOCK WAVE LITHOTRIPSY Left 10/10/2017   Procedure: LEFT EXTRACORPOREAL SHOCK WAVE LITHOTRIPSY (ESWL);  Surgeon: Bjorn Loser, MD;  Location: [facility] ORS;  Service: Urology;  Laterality: Left;   EYE SURGERY     HEMORRHOID SURGERY N/A 11/19/2012   Procedure: HEMORRHOIDECTOMY;  Surgeon: Jamesetta So, MD;  Location: [facility] ORS;  Service: General;  Laterality: N/A;   IR IMAGING  GUIDED PORT INSERTION  01/18/2022   kidney stones  1998   LAPAROSCOPIC UNILATERAL SALPINGO OOPHERECTOMY  05/14/2012   Procedure: LAPAROSCOPIC UNILATERAL SALPINGO OOPHORECTOMY;  Surgeon: [name], MD;  Location: [facility];  Service: Gynecology;  Laterality: Right;  laparoscopic right salpingo-oophorectomy   PARTIAL HYSTERECTOMY     POLYPECTOMY  12/25/2019   Procedure: POLYPECTOMY;  Surgeon: Rogene [place], MD;  Location: [facility];  Service: Endoscopy;;   TONSILECTOMY, ADENOIDECTOMY, BILATERAL MYRINGOTOMY AND TUBES     VESICOVAGINAL FISTULA CLOSURE W/ TAH      Social History:  Social History   Socioeconomic History   Marital status: Married    Spouse name: [name]   Number of children: 1   Years of education: 12   Highest education level: Some college, no degree  Occupational History    Employer: [employer]  Tobacco Use   Smoking status: Former    Types: Cigarettes    Quit date: 02/27/1999    Years since quitting: 23.4   Smokeless tobacco: Never   Tobacco comments:    socially   Vaping Use   Vaping Use: Never used  Substance and Sexual Activity   Alcohol use: No  Alcohol/week: 0.0 standard drinks of alcohol   Drug use: No   Sexual activity: Yes    Birth control/protection: Surgical    Comment: hyst  Other Topics Concern   Not on file  Social History Narrative   Not on file   Social Determinants of Health   Financial Resource Strain: Low Risk  (08/08/2021)   Overall Financial Resource Strain (CARDIA)    Difficulty of Paying Living Expenses: Not hard at all  Food Insecurity: No Food Insecurity (06/19/2022)   Hunger Vital Sign    Worried About Running Out of Food in the Last Year: Never true    Ran Out of Food in the Last Year: Never true  Transportation Needs: No Transportation Needs (06/19/2022)   PRAPARE - Hydrologist (Medical): No    Lack of Transportation (Non-Medical): No  Physical Activity: Unknown (08/08/2021)   Exercise Vital  Sign    Days of Exercise per Week: Not on file    Minutes of Exercise per Session: 10 min  Stress: No Stress Concern Present (08/08/2021)   [place]    Feeling of Stress : Not at all  Social Connections: [place] (08/08/2021)   Social Connection and Isolation Panel [NHANES]    Frequency of Communication with Friends and Family: More than three times a week    Frequency of Social Gatherings with Friends and Family: Twice a week    Attends Religious Services: More than 4 times per year    Active Member of [employer] or Organizations: Yes    Attends Music therapist: More than 4 times per year    Marital Status: Married  Human resources officer Violence: Not At Risk (06/19/2022)   Humiliation, Afraid, Rape, and Kick questionnaire    Fear of Current or Ex-Partner: No    Emotionally Abused: No    Physically Abused: No    Sexually Abused: No    Family History:  Family History  Problem Relation Age of Onset   Arthritis Other    Cancer Other    Diabetes Other    Hypertension Mother    Dementia Mother    Diabetes Father    ALS Father    Diabetes Brother    Hypertension Brother    Cancer Paternal Aunt    COPD Maternal Grandmother    Cancer Maternal Grandfather    Anesthesia problems Paternal Grandfather     Medications:   Current Facility-Administered Medications on File Prior to Visit  Medication Dose Route Frequency Provider Last Rate Last Admin   0.9 %  sodium chloride infusion (Manually program via Guardrails IV Fluids)   Intravenous Once Autry, Lauren E, PA-C       0.9 %  sodium chloride infusion (Manually program via Guardrails IV Fluids)   Intravenous Once Theodis Blaze E, PA-C       ceFEPIme (MAXIPIME) 2 g in sodium chloride 0.9 % 100 mL IVPB  2 g Intravenous Once Mickie Hillier, PA-C 200 mL/hr at 07/30/2022 1129 2 g at 07/30/2022 1129   metroNIDAZOLE (FLAGYL) IVPB 500 mg  500 mg Intravenous Once  Autry, Lauren E, PA-C       PHENYLephrine 80 mcg/ml in normal saline Adult IV Push Syringe (For Blood Pressure Support)  80-200 mcg Intravenous Once PRN Mickie Hillier, PA-C       vancomycin (VANCOREADY) IVPB 2000 mg/400 mL  2,000 mg Intravenous Once Kommor, Debe Coder, MD  Current Outpatient Medications on File Prior to Visit  Medication Sig Dispense Refill   acyclovir (ZOVIRAX) 400 MG tablet TAKE (1) TABLET BY MOUTH TWICE DAILY. 60 tablet 6   albuterol (VENTOLIN HFA) 108 (90 Base) MCG/ACT inhaler Inhale 2 puffs into the lungs every 4 (four) hours as needed for wheezing or shortness of breath. 18 g 3   allopurinol (ZYLOPRIM) 300 MG tablet TAKE (1) TABLET BY MOUTH ONCE DAILY. (Patient not taking: Reported on 07/31/2022) 90 tablet 0   aspirin EC 81 MG tablet Take 1 tablet (81 mg total) by mouth daily with breakfast. For stroke prophylaxis 30 tablet 11   BD PEN NEEDLE NANO U/F 32G X 4 MM MISC Inject into the skin.     carvedilol (COREG) 3.125 MG tablet Take 1 tablet (3.125 mg total) by mouth 2 (two) times daily with a meal. 60 tablet 1   ciprofloxacin (CIPRO) 500 MG tablet Take 1 tablet (500 mg total) by mouth 2 (two) times daily. 10 tablet 0   dexamethasone (DECADRON) 4 MG tablet Take 5 tablets (20 mg total) by mouth 2 (two) times a week. Take on day 1 and day 3 along with Xpovio. (Patient not taking: Reported on 08/05/2022) 40 tablet 3   diclofenac Sodium (VOLTAREN) 1 % GEL Apply 2 g topically 4 (four) times daily. 100 g 0   docusate sodium (COLACE) 100 MG capsule Take 100 mg by mouth daily.      escitalopram (LEXAPRO) 10 MG tablet TAKE (1) TABLET BY MOUTH ONCE DAILY. MAY START WITH 1/2 TABLET FOR 7 DAYS. 90 tablet 0   fluticasone (FLONASE) 50 MCG/ACT nasal spray INSTILL 2 SPRAYS INTO BOTH NOSTRILS DAILY 16 g 4   lacosamide 100 MG TABS Take 1 tablet (100 mg total) by mouth 2 (two) times daily. 60 tablet 2   levETIRAcetam (KEPPRA) 1000 MG tablet Take 1 tablet (1,000 mg total) by mouth 2 (two)  times daily. 60 tablet 2   lidocaine-prilocaine (EMLA) cream Apply 1 Application topically as needed (Apply to port prior to treatment and flushes). (Patient not taking: Reported on 07/18/2022) 30 g 6   magnesium oxide (MAG-OX) 400 (241.3 Mg) MG tablet Take 1 tablet (400 mg total) by mouth 3 (three) times daily. 90 tablet 3   melatonin 3 MG TABS tablet Take 1 tablet (3 mg total) by mouth at bedtime as needed. 30 tablet 0   metFORMIN (GLUCOPHAGE) 500 MG tablet Take 1 tablet (500 mg total) by mouth 2 (two) times daily. 60 tablet 4   metoCLOPramide (REGLAN) 10 MG tablet Take 1 tablet (10 mg total) by mouth every 8 (eight) hours as needed for nausea. 90 tablet 3   nitrofurantoin, macrocrystal-monohydrate, (MACROBID) 100 MG capsule TAKE (1) CAPSULE BY MOUTH AT BEDTIME. (Patient taking differently: Take 100 mg by mouth at bedtime.) 90 capsule 0   oxyCODONE-acetaminophen (PERCOCET/ROXICET) 5-325 MG tablet Take 1 tablet by mouth 2 (two) times daily as needed for severe pain. (Patient not taking: Reported on 07/14/2022) 30 tablet 0   pantoprazole (PROTONIX) 40 MG tablet TAKE (1) TABLET BY MOUTH ONCE DAILY. (Patient taking differently: Take 40 mg by mouth daily.) 90 tablet 0   polyethylene glycol (MIRALAX / GLYCOLAX) 17 g packet Take 17 g by mouth daily as needed for mild constipation. 14 each 0   pregabalin (LYRICA) 200 MG capsule TAKE (1) CAPSULE BY MOUTH TWICE DAILY. (Patient taking differently: Take 200 mg by mouth 2 (two) times daily.) 60 capsule 11   Propylene Glycol (SYSTANE  BALANCE) 0.6 % SOLN Apply 1 drop to eye daily as needed (dry eye).      QUEtiapine (SEROQUEL) 25 MG tablet Take 1 tablet (25 mg total) by mouth daily. 30 tablet 1   rosuvastatin (CRESTOR) 5 MG tablet Take 1 tablet (5 mg total) by mouth daily. 90 tablet 3   selinexor (XPOVIO) Therapy Pack (80 mg twice weekly) Take 4 tablets (80 mg total) by mouth 2 (two) times a week. Take on days 1 and 3 of each week. 32 tablet 3   traMADol (ULTRAM) 50  MG tablet Take 1 tablet (50 mg total) by mouth every 8 (eight) hours as needed for severe pain. (Patient not taking: Reported on 07/10/2022) 30 tablet 0   traZODone (DESYREL) 50 MG tablet Take 1 tablet (50 mg total) by mouth at bedtime. 30 tablet 1    Allergies:  No Known Allergies    OBJECTIVE:  Physical Exam  There were no vitals filed for this visit.  There is no height or weight on file to calculate BMI. No results found.  General: well developed, well nourished, very pleasant middle-age Caucasian female, seated, in no evident distress Head: head normocephalic and atraumatic.   Neck: supple with no carotid or supraclavicular bruits Cardiovascular: regular rate and rhythm, no murmurs Musculoskeletal: no deformity Skin:  no rash/petichiae Vascular:  Normal pulses all extremities   Neurologic Exam Mental Status: Awake and fully alert.  Mild expressive aphasia with occasional speech hesitancy.  Oriented to place and time. Recent and remote memory intact. Attention span, concentration and fund of knowledge appropriate. Mood and affect appropriate.  Cranial Nerves: Pupils equal, briskly reactive to light. Extraocular movements full without nystagmus. Visual fields full to confrontation. Hearing intact. Facial sensation intact. Face, tongue, palate moves normally and symmetrically.  Motor: Normal bulk and tone. Normal strength in all tested extremity muscles. Sensory.: intact to touch , pinprick , position and vibratory sensation.  Coordination: Rapid alternating movements normal in all extremities. Finger-to-nose and heel-to-shin performed accurately bilaterally. Gait and Station: Arises from chair without difficulty. Stance is normal. Gait demonstrates normal stride length and balance without use of assistive device Reflexes: 1+ and symmetric. Toes downgoing.       ASSESSMENT/PLAN: Joanna Reid is a 57 y.o. year old female with embolic left MCA stroke on 12/28/2019 secondary to  unknown source. Currently receiving chemotherapy for multiple myeloma and had thrombocytopenia during admission - Revlimid on hold since that time.  Vascular risk factors include multiple myeloma on chemotherapy, thrombocytopenia, HTN, HLD, DM and migraines. Presented to ED 06/18/2022 with new onset seizures felt to be due to prior stroke     Seizure, late effect of stroke See HPI for further information  No additional seizures  Left MCA stroke:  Residual deficits: Mild expressive aphasia and possible alexia -referral placed to SLP Continue aspirin 81 mg daily  and Crestor 10 mg daily for secondary stroke prevention.  30-day cardiac event monitor negative for atrial fibrillation Discussed secondary stroke prevention measures and importance of close PCP follow-up for aggressive stroke risk factor management including BP goal<130/90, HLD with LDL goal<70 and DM with A1c.<7   Multiple myeloma: Routinely followed by oncology receiving Velcade infusions every 2 weeks. Recent dx of T12-L1 fracture - following with ortho with concerns of AVN (per pt report) - awaiting results of recent hip imaging.     Overall stable from stroke standpoint -recommend consolidating care with PCP for continued aggressive stroke risk factor management and follow-up on an  as-needed basis     CC:  Lindell Spar, MD    I spent 32 minutes of face-to-face and non-face-to-face time with patient.  This included previsit chart review, lab review, study review, electronic health record documentation, patient education and discussion regarding prior stroke and residual deficits, secondary stroke prevention measures and aggressive stroke risk factor management, multiple myeloma and recent issues as above and answered all other questions to patient satisfaction   Frann Rider, AGNP-BC  [facility] [address] [place], [place] [address]  Phone [phone] Fax [phone] Note:  This document was prepared with digital dictation and possible smart phrase technology. Any transcriptional errors that result from this process are unintentional.

## 2022-07-20 ENCOUNTER — Encounter (HOSPITAL_COMMUNITY): Payer: Self-pay | Admitting: Hematology

## 2022-07-20 DIAGNOSIS — R4182 Altered mental status, unspecified: Secondary | ICD-10-CM

## 2022-07-20 LAB — CBC WITH DIFFERENTIAL/PLATELET
Abs Immature Granulocytes: 0 10*3/uL (ref 0.00–0.07)
Basophils Absolute: 0 10*3/uL (ref 0.0–0.1)
Basophils Relative: 0 %
Eosinophils Absolute: 0.2 10*3/uL (ref 0.0–0.5)
Eosinophils Relative: 2 %
HCT: 21.8 % — ABNORMAL LOW (ref 36.0–46.0)
Hemoglobin: 7.4 g/dL — ABNORMAL LOW (ref 12.0–15.0)
Lymphocytes Relative: 31 %
Lymphs Abs: 2.5 10*3/uL (ref 0.7–4.0)
MCH: 31 pg (ref 26.0–34.0)
MCHC: 33.9 g/dL (ref 30.0–36.0)
MCV: 91.2 fL (ref 80.0–100.0)
Monocytes Absolute: 0.9 10*3/uL (ref 0.1–1.0)
Monocytes Relative: 11 %
Neutro Abs: 4.6 10*3/uL (ref 1.7–7.7)
Neutrophils Relative %: 56 %
Platelets: 40 10*3/uL — ABNORMAL LOW (ref 150–400)
RBC: 2.39 MIL/uL — ABNORMAL LOW (ref 3.87–5.11)
RDW: 18.4 % — ABNORMAL HIGH (ref 11.5–15.5)
Smear Review: DECREASED
WBC Morphology: ABNORMAL
WBC: 8.2 10*3/uL (ref 4.0–10.5)
nRBC: 0.2 % (ref 0.0–0.2)

## 2022-07-20 LAB — BPAM PLATELET PHERESIS
Blood Product Expiration Date: 202401112359
Blood Product Expiration Date: 202401132359
ISSUE DATE / TIME: 202401111257
ISSUE DATE / TIME: 202401112221
Unit Type and Rh: 6200
Unit Type and Rh: 6200

## 2022-07-20 LAB — URINE CULTURE
Culture: >=100000 — AB
Culture: NO GROWTH

## 2022-07-20 LAB — COMPREHENSIVE METABOLIC PANEL
ALT: 28 U/L (ref 0–44)
AST: 32 U/L (ref 15–41)
Albumin: 1.9 g/dL — ABNORMAL LOW (ref 3.5–5.0)
Alkaline Phosphatase: 171 U/L — ABNORMAL HIGH (ref 38–126)
Anion gap: 9 (ref 5–15)
BUN: 22 mg/dL — ABNORMAL HIGH (ref 6–20)
CO2: 25 mmol/L (ref 22–32)
Calcium: 7.3 mg/dL — ABNORMAL LOW (ref 8.9–10.3)
Chloride: 107 mmol/L (ref 98–111)
Creatinine, Ser: 0.93 mg/dL (ref 0.44–1.00)
GFR, Estimated: >60 mL/min (ref >60)
Glucose, Bld: 86 mg/dL (ref 70–99)
Potassium: 3.3 mmol/L — ABNORMAL LOW (ref 3.5–5.1)
Sodium: 141 mmol/L (ref 135–145)
Total Bilirubin: 1.7 mg/dL — ABNORMAL HIGH (ref 0.3–1.2)
Total Protein: 4.4 g/dL — ABNORMAL LOW (ref 6.5–8.1)

## 2022-07-20 LAB — MAGNESIUM: Magnesium: 2 mg/dL (ref 1.7–2.4)

## 2022-07-20 LAB — PREPARE PLATELET PHERESIS
Unit division: 0
Unit division: 0

## 2022-07-20 LAB — LACTIC ACID, PLASMA: Lactic Acid, Venous: 1.5 mmol/L (ref 0.5–1.9)

## 2022-07-20 LAB — PROTEIN ELECTROPHORESIS, SERUM
A/G Ratio: 1.2 (ref 0.7–1.7)
Albumin ELP: 2.7 g/dL — ABNORMAL LOW (ref 2.9–4.4)
Alpha-1-Globulin: 0.4 g/dL (ref 0.0–0.4)
Alpha-2-Globulin: 0.5 g/dL (ref 0.4–1.0)
Beta Globulin: 0.4 g/dL — ABNORMAL LOW (ref 0.7–1.3)
Gamma Globulin: 1 g/dL (ref 0.4–1.8)
Globulin, Total: 2.3 g/dL (ref 2.2–3.9)
M-Spike, %: 0.6 g/dL — ABNORMAL HIGH
Total Protein ELP: 5 g/dL — ABNORMAL LOW (ref 6.0–8.5)

## 2022-07-20 LAB — GLUCOSE, CAPILLARY
Glucose-Capillary: 115 mg/dL — ABNORMAL HIGH (ref 70–99)
Glucose-Capillary: 64 mg/dL — ABNORMAL LOW (ref 70–99)
Glucose-Capillary: 99 mg/dL (ref 70–99)
Glucose-Capillary: 70 mg/dL (ref 70–99)

## 2022-07-20 LAB — PHOSPHORUS: Phosphorus: 3.6 mg/dL (ref 2.5–4.6)

## 2022-07-20 LAB — PROTIME-INR
INR: 1.7 — ABNORMAL HIGH (ref 0.8–1.2)
Prothrombin Time: 19.6 seconds — ABNORMAL HIGH (ref 11.4–15.2)

## 2022-07-20 LAB — AMMONIA: Ammonia: 102 umol/L — ABNORMAL HIGH (ref 9–35)

## 2022-07-20 MED ORDER — DEXTROSE 50 % IV SOLN
12.5000 g | Freq: Once | INTRAVENOUS | Status: AC
  Administered 2022-07-20: 12.5 g via INTRAVENOUS

## 2022-07-20 MED ORDER — DEXTROSE 50 % IV SOLN
INTRAVENOUS | Status: AC
  Filled 2022-07-20: qty 50

## 2022-07-20 MED ORDER — ORAL CARE MOUTH RINSE
15.0000 mL | OROMUCOSAL | Status: DC | PRN
  Administered 2022-07-20: 15 mL via OROMUCOSAL

## 2022-07-20 MED ORDER — POTASSIUM CHLORIDE 10 MEQ/50ML IV SOLN
10.0000 meq | INTRAVENOUS | Status: AC
  Administered 2022-07-20 (×4): 10 meq via INTRAVENOUS
  Filled 2022-07-20 (×4): qty 50

## 2022-07-20 MED ORDER — POTASSIUM CHLORIDE 20 MEQ PO PACK
20.0000 meq | PACK | ORAL | Status: DC
  Filled 2022-07-20: qty 1

## 2022-07-20 MED ORDER — ORAL CARE MOUTH RINSE
15.0000 mL | OROMUCOSAL | Status: DC
  Administered 2022-07-20 – 2022-07-24 (×18): 15 mL via OROMUCOSAL

## 2022-07-20 MED ORDER — MORPHINE BOLUS VIA INFUSION
1.0000 mg | INTRAVENOUS | Status: DC | PRN
  Administered 2022-07-20: 1 mg via INTRAVENOUS
  Administered 2022-07-21 (×6): 2 mg via INTRAVENOUS

## 2022-07-20 MED ORDER — MORPHINE 100MG IN NS 100ML (1MG/ML) PREMIX INFUSION
1.0000 mg/h | INTRAVENOUS | Status: DC
  Administered 2022-07-20: 1 mg/h via INTRAVENOUS
  Administered 2022-07-22: 10 mg/h via INTRAVENOUS
  Administered 2022-07-22: 4 mg/h via INTRAVENOUS
  Administered 2022-07-23: 12 mg/h via INTRAVENOUS
  Administered 2022-07-23: 11 mg/h via INTRAVENOUS
  Administered 2022-07-23 – 2022-07-24 (×2): 12 mg/h via INTRAVENOUS
  Administered 2022-07-24 – 2022-07-25 (×3): 13 mg/h via INTRAVENOUS
  Administered 2022-07-25: 14 mg/h via INTRAVENOUS
  Filled 2022-07-20 (×11): qty 100

## 2022-07-20 NOTE — TOC Initial Note (Addendum)
Transition of Care [facility]) - Initial/Assessment Note    Patient Details  Name: Joanna Reid MRN: [id number] Date of Birth: 01-31-1966  Transition of Care [facility]) CM/SW Contact:    Ella Bodo, RN Phone Number: 07/20/2022, 2:45pm  Clinical Narrative:                 57 yo F PMH multiple myeloma, CVA, seizure/SE (hospitalized 06/18/22-07/11/22), anemia/thrombocytopenia who presented to [facility] ED 08/04/2022 with AMS. CT H revealed small L SDH, labs with anemia (6.4) and thrombocytopenia (23), hyperammonemia (91). A CT c/a/p was also acquired which shows incr size of multifocal lesions in liver.  Prior to admission, patient was residing at home with husband, [name].  Received consult for home hospice: Met with [name] at bedside to discuss this. Per medical team, comfort measures have been initiated and patient is now a DNR.  Husband states that patient's best friend works at a hospice agency, [employer], in [place].  They have agreed to accept patient for hospice services, per husband.  He states they have a hospital bed, wheelchair, and lift at home.  He states that patient's oncologist states there is nothing else they can do to treat her cancer, and that she may have days to weeks to live.  Contracted with husband to send referral to [facility], and discharge home soon as possible, pending ability to transport.   Spoke with Jesse Sans at [facility], phone number [phone]; she is requesting clinical information which I have faxed to [phone].  Await callback from Mickel Baas with updates.   Expected Discharge Plan: Home w Hospice Care Barriers to Discharge: Continued Medical Work up   Patient Goals and CMS Choice   CMS Medicare.gov Compare Post Acute Care list provided to:: Patient Represenative (must comment) (husband) Choice offered to / list presented to : Spouse      Expected Discharge Plan and Services   Discharge Planning Services: CM Consult Post Acute  Care Choice: Hospice Living arrangements for the past 2 months: Single Family Home                                      Prior Living Arrangements/Services Living arrangements for the past 2 months: Single Family Home Lives with:: Spouse Patient language and need for interpreter reviewed:: Yes        Need for Family Participation in Patient Care: Yes (Comment) Care giver support system in place?: Yes (comment) Current home services: DME, Home OT, Home PT, Home RN Criminal Activity/Legal Involvement Pertinent to Current Situation/Hospitalization: No - Comment as needed               Emotional Assessment   Attitude/Demeanor/Rapport: Unable to Assess Affect (typically observed): Unable to Assess        Admission diagnosis:  Subdural hematoma ([place]) [S06.5XAA] Altered mental status [R41.82] Altered mental status, unspecified altered mental status type [R41.82] Patient Active Problem List   Diagnosis Date Noted   Altered mental status 08/08/2022   Subdural hematoma (HCC) 07/23/2022   Status epilepticus ([place]) 06/19/2022   Generalized weakness 06/18/2022   Hypokalemia 06/18/2022   Grand mal seizure ([place]) 06/18/2022   Hypomagnesemia 06/18/2022   Hospital discharge follow-up 04/19/2022   Acute bronchitis 04/12/2022   Subclinical hypothyroidism 02/05/2022   Encounter for general adult medical examination with abnormal findings 02/05/2022   Vitamin D deficiency 02/05/2022   Overactive bladder 11/30/2021  Hordeolum internum of left lower eyelid 08/07/2021   Drug-induced polyneuropathy ([place]) 04/05/2021   History of CVA with residual deficit 04/05/2021   Compression fracture of L1 lumbar vertebra ([place]) 04/05/2021   Stroke (cerebrum) -Left parietal lobe, 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  12/26/2019   Hyperlipidemia 08/09/2018   Acute on chronic anemia 08/09/2018   Leukopenia 08/09/2018   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   S/P autologous bone marrow  transplantation ([place]) 04/01/2018   Abnormal echocardiogram 02/04/2018   LVH (left ventricular hypertrophy) 02/04/2018   Morbid obesity with BMI of 45.0-49.9, adult ([place]) 02/04/2018   Multiple myeloma ([place]) 10/29/2017   Hypercalcemia 10/29/2017   Primary osteoarthritis of right knee 09/20/2017   Chronic pain disorder 03/21/2017   Spondylosis of lumbar spine 03/21/2017   Thoracic spondylosis without myelopathy 03/21/2017   Type 2 diabetes mellitus with other specified complication ([place]) 20/35/5974   Anxiety as acute reaction to exceptional stress 01/13/2015   Attention deficit hyperactivity disorder (ADHD), 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   Gout 01/20/2014   GERD (gastroesophageal reflux disease) 01/20/2014   Essential hypertension, benign 01/20/2014   Plantar fasciitis of left foot 09/19/2011   PCP:  Lindell Spar, MD Pharmacy:   [place], [place] [address] [place] [address] Phone: [phone] Fax: [phone]  [facility], [place] - [address] [place] [address] Phone: [phone] Fax: [phone]     Social Determinants of Health (SDOH) Social History: SDOH Screenings   Food Insecurity: No Food Insecurity (06/19/2022)  Housing: Low Risk  (06/19/2022)  Transportation Needs: No Transportation Needs (06/19/2022)  Utilities: Not At Risk (06/19/2022)  Alcohol Screen: Low Risk  (08/08/2021)  Depression (PHQ2-9): Low Risk  (04/19/2022)  Financial Resource Strain: Low Risk  (08/08/2021)  Physical Activity: Unknown (08/08/2021)  Social Connections: Socially Integrated (08/08/2021)  Stress: No Stress Concern Present (08/08/2021)  Tobacco Use: Medium Risk (07/18/2022)   SDOH Interventions:     Readmission Risk Interventions     No data to display         Reinaldo Raddle, RN, BSN  Trauma/Neuro ICU Case Manager [phone]

## 2022-07-20 NOTE — Progress Notes (Signed)
  Transition of Care [facility]) Screening Note   Patient Details  Name: Joanna Reid Date of Birth: 11-14-1965   [facility]) CM/SW Contact:    Benard Halsted, LCSW Phone Number: 07/20/2022, 9:58 AM    Transition of Care Department [facility]) has reviewed patient. We will continue to monitor patient advancement through interdisciplinary progression rounds. If new patient transition needs arise, please place a TOC consult.

## 2022-07-20 NOTE — Progress Notes (Signed)
   07/20/22 1345  Spiritual Encounters  Type of Visit Initial  Care provided to: Family  Referral source Nurse (RN/NT/LPN)  Reason for visit Routine spiritual support   Chaplain responded to a nurse's request for support. I provided a listening presence for the patient's spouse, Joanna Reid. He shared the patient's story. A story of Laylia's radiant smile that lights up a room her strength in her faith and the work her God blessed her with. Joanna Reid also shared their story together and how much fun they had together. Now though he realizes how hard his wife has fought and that she is tired. He is trying to come to peace with letting go. We prayed for a bit and I encouraged Joanna Reid to share Joanna Reid's story with others as he shared with me. Let her live through his work.   Danice Goltz [facility]  [phone]

## 2022-07-20 NOTE — Progress Notes (Signed)
NAME:  Joanna Reid, MRN:  [id number], DOB:  1966-01-28, LOS: 1 ADMISSION DATE:  07/16/2022, CONSULTATION DATE:  07/26/2022 REFERRING MD:  [place] - [facility] EM, CHIEF COMPLAINT:  AMS   History of Present Illness:   57 yo F PMH multiple myeloma, CVA, seizure/SE (hospitalized 06/18/22-07/11/22), anemia/thrombocytopenia who presented to [facility] ED 07/20/2022 with AMS. CT H revealed small L SDH, labs with anemia (6.4) and thrombocytopenia (23), hyperammonemia (91). A CT c/a/p was also acquired which shows incr size of multifocal lesions in liver   Was seen at cancer center 1/10 -- sounds like at that time pt husband shared she has been lethargic since starting AEDs, and was confused. UA/Ucx were acquired & was started on Cipro for possible UTI (takes daily macrobid for recurrent UTIs) and an MRI brain was ordered. She was thrombocytopenic (29) and anemic (8.2) at that time, but in absence of bleed was not transfused.  Was given 2L IVF for her AKI and mag as well.   Ucx from 1/10 with >100,000 GNR  Pertinent  Medical History  Multiple myeloma Seizure Stroke   Significant Hospital Events: Including procedures, antibiotic start and stop dates in addition to other pertinent events   1/10 outpt cancer center appt. Started on cipro for possible UTI  1/11 [facility] for AMS. Spontaneous SDH. Receiving plt for thrombocytopenia in setting of bleed. Abx to cefepime for UTI (GNR). Transfer to [facility]   1/12 Pt initially resting calmly with prn Morphine and then became severely agitated, husband is ready for full comfort care   Interim History / Subjective:  Restless, encephalopathic   Objective   Blood pressure 91/76, pulse 82, temperature 98.3 F (36.8 C), temperature source Axillary, resp. rate 14, height '5\' 4"'$  (1.626 m), SpO2 98 %.        Intake/Output Summary (Last 24 hours) at 07/20/2022 0752 Last data filed at 07/20/2022 0600 Gross per 24 hour  Intake 4531.08 ml  Output 1750 ml  Net 2781.08 ml    There were no  vitals filed for this visit.   General:  acutely ill F, restless and moaning HEENT: MM pink/moist Neuro: moving all extremities, moaning, not answering questions or following commands CV: s1s2 rrr, no m/r/g PULM:  clear bilaterally, tachypneic  GI: soft, non-distended  Extremities: warm/dry, 2+ edema  Skin: no rashes or lesions   Resolved Hospital Problem list     Assessment & Plan:     Multiple myeloma stg III Hepatic lesions  Had dz progression with daratumumab, carfilzomib, dexamethsone. Selinexor held with low plt.  In last onc note, sounds like functional status precludes tx for myeloma.  -husband states that pt's oncologist has communicated that there is no further treatment options for patient and his goal is to bring her home on hospice, he would like for [name] to reach out to her oncologist Dr. Delton Coombes, will attempt to do so  -initiate comfort measures, DNR, stop blood draws  -Supportive care      Acute encephalopathy - probably multifactorial Acute Spontaneous L SDH Seizure disorder Hx CVA w residual aphasia  NSGY consult by EDP with no reccomandations for no interventions at this time   -comfort care   Acute on chronic anemia Acute on chronic thrombocytopenia Coagulopathy  -Hemoglobin today at 9 AM 6.42 units of packed red blood cells was ordered at [facility] but unfortunately patient has multiple antibodies and cross match was not able to be obtained prior to transfer.  Patient will need new type and screen  here, order placed. -Saved 1 unit of platelets prior to transfer -INR on admit 1.6   Elevated alk phos  Hyperammonemia Hyperbilirubinemia  Hepatic steatosis, severe  -no biliary ductal dilation, normal GB  -stop enemas in the setting of comfort measures  AKI  Urinary tract infection History of diastolic congestive heart failure -comfort   Diet/type: NPO DVT prophylaxis: SCD GI prophylaxis: PPI Lines: N/A Foley:  N/A Code Status:  full  code Last date of multidisciplinary goals of care discussion: 1/12 GOC discussed with pt's husband, Dr. Lynetta Mare and myself  Labs   CBC: Recent Labs  Lab 07/18/22 1058 07/09/2022 0928 07/22/2022 1009 08/01/2022 1129 07/21/2022 2005 07/20/22 0330  WBC 9.1 8.1  --   --  8.9 8.2  NEUTROABS 5.1 4.8  --   --   --  4.6  HGB 8.2* 6.4* 6.5*  --  6.2* 7.4*  HCT 25.5* 20.0* 19.0*  --  19.2* 21.8*  MCV 94.4 94.8  --   --  93.7 91.2  PLT 29* 23*  --  22* 36* 40*     Basic Metabolic Panel: Recent Labs  Lab 07/18/22 1058 07/21/2022 0928 08/05/2022 1009 07/20/22 0545  NA 136 138 138 141  K 3.3* 3.3* 3.3* 3.3*  CL 98 102 100 107  CO2 24 25  --  25  GLUCOSE 104* 91 85 86  BUN 32* 31* 28* 22*  CREATININE 1.13* 1.05* 1.10* 0.93  CALCIUM 8.1* 7.6*  --  7.3*  MG 1.1*  --   --   --     GFR: CrCl cannot be calculated (Unknown ideal weight.). Recent Labs  Lab 07/18/22 1058 08/03/2022 0928 08/07/2022 0941 07/14/2022 1129 07/23/2022 2005 07/20/22 0330 07/20/22 0545  WBC 9.1 8.1  --   --  8.9 8.2  --   LATICACIDVEN  --   --  2.9* 3.0* 2.3*  --  1.5     Liver Function Tests: Recent Labs  Lab 07/18/22 1058 07/09/2022 0928 07/20/22 0545  AST 31 40 32  ALT 27 32 28  ALKPHOS 240* 193* 171*  BILITOT 1.9* 2.0* 1.7*  PROT 5.3* 4.8* 4.4*  ALBUMIN 2.4* 2.2* 1.9*    No results for input(s): "LIPASE", "AMYLASE" in the last 168 hours. Recent Labs  Lab 07/20/2022 1129 07/20/22 0545  AMMONIA 93* 102*     ABG    Component Value Date/Time   HCO3 28.5 (H) 07/29/2022 1129   TCO2 24 07/20/2022 1009   O2SAT 41.9 08/06/2022 1129     Coagulation Profile: Recent Labs  Lab 07/29/2022 0928 07/21/2022 1129 07/20/22 0545  INR 1.6* 1.6* 1.7*     Cardiac Enzymes: No results for input(s): "CKTOTAL", "CKMB", "CKMBINDEX", "TROPONINI" in the last 168 hours.  HbA1C: HbA1c, POC (prediabetic range)  Date/Time Value Ref Range Status  08/07/2021 10:27 AM 6.1 5.7 - 6.4 % Final   HbA1c, POC (controlled  diabetic range)  Date/Time Value Ref Range Status  08/07/2021 10:27 AM 6.1 0.0 - 7.0 % Final   Hgb A1c MFr Bld  Date/Time Value Ref Range Status  06/19/2022 04:16 AM 7.2 (H) 4.8 - 5.6 % Final    Comment:    (NOTE)         Prediabetes: 5.7 - 6.4         Diabetes: >6.4         Glycemic control for adults with diabetes: <7.0   01/30/2022 09:00 AM 6.6 (H) 4.8 - 5.6 % Final    Comment:  Prediabetes: 5.7 - 6.4          Diabetes: >6.4          Glycemic control for adults with diabetes: <7.0     CBG: Recent Labs  Lab 08/03/2022 1614 07/27/2022 1950 07/14/2022 2339 07/20/22 0326 07/20/22 0352  GLUCAP 57* 79 70 64* 99     Review of Systems:   Unable to assess   Past Medical History:  She,  has a past medical history of Acid reflux, Allergic rhinitis, Cancer ([place]), Diabetes mellitus, Gout, Gout, H/o COVID-19--- was Positive 09/17/2019, Negative 12/23/19 AND also Neg on  12/26/19 (12/26/2019), HBP (high blood pressure), History of kidney stones, Migraines, and Multiple myeloma ([place]) (10/29/2017).   Surgical History:   Past Surgical History:  Procedure Laterality Date   BREAST CYST EXCISION Left    2009 no visible scar on skin   CESAREAN SECTION     COLONOSCOPY WITH PROPOFOL N/A 12/25/2019   Procedure: COLONOSCOPY WITH PROPOFOL;  Surgeon: Rogene Houston, MD;  Location: [facility]ITE;  Service: Endoscopy;  Laterality: N/A;  730   EXTRACORPOREAL SHOCK WAVE LITHOTRIPSY Left 10/10/2017   Procedure: LEFT EXTRACORPOREAL SHOCK WAVE LITHOTRIPSY (ESWL);  Surgeon: Bjorn Loser, MD;  Location: [facility] ORS;  Service: Urology;  Laterality: Left;   EYE SURGERY     HEMORRHOID SURGERY N/A 11/19/2012   Procedure: HEMORRHOIDECTOMY;  Surgeon: Jamesetta So, MD;  Location: [facility] ORS;  Service: General;  Laterality: N/A;   IR IMAGING GUIDED PORT INSERTION  01/18/2022   kidney stones  1998   LAPAROSCOPIC UNILATERAL SALPINGO OOPHERECTOMY  05/14/2012   Procedure: LAPAROSCOPIC UNILATERAL SALPINGO  OOPHORECTOMY;  Surgeon: [name], MD;  Location: [facility] ORS;  Service: Gynecology;  Laterality: Right;  laparoscopic right salpingo-oophorectomy   PARTIAL HYSTERECTOMY     POLYPECTOMY  12/25/2019   Procedure: POLYPECTOMY;  Surgeon: Rogene Houston, MD;  Location: [facility];  Service: Endoscopy;;   TONSILECTOMY, ADENOIDECTOMY, BILATERAL MYRINGOTOMY AND TUBES     VESICOVAGINAL FISTULA CLOSURE W/ TAH       Social History:   reports that she quit smoking about 23 years ago. Her smoking use included cigarettes. She has never used smokeless tobacco. She reports that she does not drink alcohol and does not use drugs.   Family History:  Her family history includes ALS in her father; Anesthesia problems in her paternal grandfather; Arthritis in an other family member; COPD in her maternal grandmother; Cancer in her maternal grandfather, paternal aunt, and another family member; Dementia in her mother; Diabetes in her brother, father, and another family member; Hypertension in her brother and mother.   Allergies No Known Allergies   Home Medications  Prior to Admission medications   Medication Sig Start Date End Date Taking? Authorizing Provider  acyclovir (ZOVIRAX) 400 MG tablet TAKE (1) TABLET BY MOUTH TWICE DAILY. 03/01/22  Yes Derek Jack, MD  albuterol (VENTOLIN HFA) 108 (90 Base) MCG/ACT inhaler Inhale 2 puffs into the lungs every 4 (four) hours as needed for wheezing or shortness of breath. 04/16/22  Yes Roxan Hockey, MD  aspirin EC 81 MG tablet Take 1 tablet (81 mg total) by mouth daily with breakfast. For stroke prophylaxis 04/16/22  Yes Emokpae, Courage, MD  carvedilol (COREG) 3.125 MG tablet Take 1 tablet (3.125 mg total) by mouth 2 (two) times daily with a meal. 07/11/22  Yes Bonnielee Haff, MD  ciprofloxacin (CIPRO) 500 MG tablet Take 1 tablet (500 mg total) by mouth 2 (two) times daily.  07/18/22  Yes Derek Jack, MD  diclofenac Sodium (VOLTAREN) 1 % GEL Apply 2 g  topically 4 (four) times daily. 05/01/21  Yes Caccavale, Sophia, PA-C  docusate sodium (COLACE) 100 MG capsule Take 100 mg by mouth daily.    Yes [provider]  escitalopram (LEXAPRO) 10 MG tablet TAKE (1) TABLET BY MOUTH ONCE DAILY. MAY START WITH 1/2 TABLET FOR 7 DAYS. 05/03/22  Yes Lindell Spar, MD  fluticasone (FLONASE) 50 MCG/ACT nasal spray INSTILL 2 SPRAYS INTO BOTH NOSTRILS DAILY 06/28/21  Yes Lindell Spar, MD  lacosamide 100 MG TABS Take 1 tablet (100 mg total) by mouth 2 (two) times daily. 07/11/22  Yes Bonnielee Haff, MD  levETIRAcetam (KEPPRA) 1000 MG tablet Take 1 tablet (1,000 mg total) by mouth 2 (two) times daily. 07/11/22  Yes Bonnielee Haff, MD  magnesium oxide (MAG-OX) 400 (241.3 Mg) MG tablet Take 1 tablet (400 mg total) by mouth 3 (three) times daily. 10/02/18  Yes Derek Jack, MD  melatonin 3 MG TABS tablet Take 1 tablet (3 mg total) by mouth at bedtime as needed. 07/11/22  Yes Bonnielee Haff, MD  metFORMIN (GLUCOPHAGE) 500 MG tablet Take 1 tablet (500 mg total) by mouth 2 (two) times daily. 04/16/22 09/10/22 Yes Emokpae, Courage, MD  metoCLOPramide (REGLAN) 10 MG tablet Take 1 tablet (10 mg total) by mouth every 8 (eight) hours as needed for nausea. 05/03/22  Yes Derek Jack, MD  nitrofurantoin, macrocrystal-monohydrate, (MACROBID) 100 MG capsule TAKE (1) CAPSULE BY MOUTH AT BEDTIME. Patient taking differently: Take 100 mg by mouth at bedtime. 03/14/22  Yes Derek Jack, MD  pantoprazole (PROTONIX) 40 MG tablet TAKE (1) TABLET BY MOUTH ONCE DAILY. Patient taking differently: Take 40 mg by mouth daily. 01/17/22  Yes Lindell Spar, MD  pregabalin (LYRICA) 200 MG capsule TAKE (1) CAPSULE BY MOUTH TWICE DAILY. Patient taking differently: Take 200 mg by mouth 2 (two) times daily. 01/25/22  Yes Derek Jack, MD  Propylene Glycol (SYSTANE BALANCE) 0.6 % SOLN Apply 1 drop to eye daily as needed (dry eye).    Yes [provider]   QUEtiapine (SEROQUEL) 25 MG tablet Take 1 tablet (25 mg total) by mouth daily. 07/11/22  Yes Bonnielee Haff, MD  rosuvastatin (CRESTOR) 5 MG tablet Take 1 tablet (5 mg total) by mouth daily. 04/16/22  Yes Emokpae, Courage, MD  selinexor (XPOVIO) Therapy Pack (80 mg twice weekly) Take 4 tablets (80 mg total) by mouth 2 (two) times a week. Take on days 1 and 3 of each week. 05/07/22  Yes Derek Jack, MD  traZODone (DESYREL) 50 MG tablet Take 1 tablet (50 mg total) by mouth at bedtime. 07/11/22  Yes Bonnielee Haff, MD  allopurinol (ZYLOPRIM) 300 MG tablet TAKE (1) TABLET BY MOUTH ONCE DAILY. Patient not taking: Reported on 07/31/2022 05/28/22   Lindell Spar, MD  BD PEN NEEDLE NANO U/F 32G X 4 MM MISC Inject into the skin. 01/31/22   [provider]  dexamethasone (DECADRON) 4 MG tablet Take 5 tablets (20 mg total) by mouth 2 (two) times a week. Take on day 1 and day 3 along with Xpovio. Patient not taking: Reported on 08/01/2022 05/10/22   Derek Jack, MD  lidocaine-prilocaine (EMLA) cream Apply 1 Application topically as needed (Apply to port prior to treatment and flushes). Patient not taking: Reported on 07/18/2022 01/24/22   Derek Jack, MD  oxyCODONE-acetaminophen (PERCOCET/ROXICET) 5-325 MG tablet Take 1 tablet by mouth 2 (two) times daily as needed for  severe pain. Patient not taking: Reported on 07/23/2022 05/29/22   Derek Jack, MD  polyethylene glycol (MIRALAX / GLYCOLAX) 17 g packet Take 17 g by mouth daily as needed for mild constipation. 04/16/22   Roxan Hockey, MD  traMADol (ULTRAM) 50 MG tablet Take 1 tablet (50 mg total) by mouth every 8 (eight) hours as needed for severe pain. Patient not taking: Reported on 07/09/2022 04/24/22   Derek Jack, MD     Critical care time:      Otilio Carpen Amy Gothard, PA-C [facility] See Amion for pager If no response to pager , please call [phone] until 7pm After 7:00 pm call  Elink  [phone]

## 2022-07-20 NOTE — Plan of Care (Signed)
  Interdisciplinary Goals of Care Family Meeting   Date carried out:: 07/20/2022  Location of the meeting: Bedside  Member's involved: Physician, Bedside Registered Nurse, and Family Member or next of kin  Durable Power of Attorney or acting medical decision maker: Pt's husband    Discussion: We discussed goals of care for Joanna Reid .  Pt's husband [name] noted that [name]'s oncologist had recently relayed to him that there is really no further treatment options for her. He would like to transition to full comfort care with the hope of going home with hospice.  The family has a friend who works in hospice to help expedite this, [facility] consult placed  Code status: Full DNR  Disposition: Home with Hospice   Time spent for the meeting: 15 minutes  [name] 07/20/2022, 12:35 PM

## 2022-07-20 NOTE — Progress Notes (Addendum)
1.5 mL (7.5 mg) of PRN diazepam wasted in med room with Phebe Colla RN.

## 2022-07-20 NOTE — Progress Notes (Signed)
Per husband [name]: goal is home with home hospice. Already set up with family friend.   [place]  Suezanne Cheshire: Family Friend

## 2022-07-21 DIAGNOSIS — Z515 Encounter for palliative care: Secondary | ICD-10-CM

## 2022-07-21 DIAGNOSIS — R4182 Altered mental status, unspecified: Secondary | ICD-10-CM | POA: Diagnosis not present

## 2022-07-21 MED ORDER — DIPHENHYDRAMINE HCL 50 MG/ML IJ SOLN: 25.0000 mg | Freq: Four times a day (QID) | INTRAMUSCULAR | Status: DC | PRN

## 2022-07-21 MED ORDER — CLONAZEPAM 0.25 MG PO TBDP: 1.0000 mg | ORAL_TABLET | Freq: Three times a day (TID) | ORAL | Status: DC | PRN

## 2022-07-21 MED ORDER — SCOPOLAMINE 1 MG/3DAYS TD PT72
1.0000 | MEDICATED_PATCH | TRANSDERMAL | Status: DC
  Administered 2022-07-21 – 2022-07-24 (×2): 1.5 mg via TRANSDERMAL
  Filled 2022-07-21 (×2): qty 1

## 2022-07-21 MED ORDER — GLYCOPYRROLATE 0.2 MG/ML IJ SOLN
0.2000 mg | INTRAMUSCULAR | Status: DC | PRN
  Administered 2022-07-21 – 2022-07-25 (×2): 0.2 mg via INTRAVENOUS
  Filled 2022-07-21 (×2): qty 1

## 2022-07-21 MED ORDER — POLYVINYL ALCOHOL 1.4 % OP SOLN: 1.0000 [drp] | Freq: Four times a day (QID) | OPHTHALMIC | Status: DC | PRN

## 2022-07-21 MED ORDER — GLYCOPYRROLATE 1 MG PO TABS: 1.0000 mg | ORAL_TABLET | ORAL | Status: DC | PRN

## 2022-07-21 MED ORDER — GLYCOPYRROLATE 0.2 MG/ML IJ SOLN: 0.2000 mg | INTRAMUSCULAR | Status: DC | PRN

## 2022-07-21 MED ORDER — HALOPERIDOL LACTATE 2 MG/ML PO CONC: 5.0000 mg | Freq: Four times a day (QID) | ORAL | Status: DC | PRN

## 2022-07-21 MED ORDER — MORPHINE SULFATE 1 MG/ML IV SOLN: 2.0000 mg | INTRAVENOUS | 0 refills | Status: AC

## 2022-07-21 MED ORDER — MORPHINE SULFATE 10 MG/5ML PO SOLN: 2.0000 mg | ORAL | Status: DC | PRN

## 2022-07-21 MED ORDER — MORPHINE SULFATE 10 MG/5ML PO SOLN
2.0000 mg | ORAL | Status: DC | PRN
  Administered 2022-07-21: 2 mg via ORAL
  Filled 2022-07-21: qty 5

## 2022-07-21 NOTE — Progress Notes (Signed)
NAME:  Joanna Reid, MRN:  [id number], DOB:  13-Jun-1966, LOS: 2 ADMISSION DATE:  07/20/2022, CONSULTATION DATE:  07/29/2022 REFERRING MD:  Kommor - [facility] EM, CHIEF COMPLAINT:  AMS   History of Present Illness:   57 yo F PMH multiple myeloma, CVA, seizure/SE (hospitalized 06/18/22-07/11/22), anemia/thrombocytopenia who presented to [facility] ED 07/17/2022 with AMS. CT H revealed small L SDH, labs with anemia (6.4) and thrombocytopenia (23), hyperammonemia (91). A CT c/a/p was also acquired which shows incr size of multifocal lesions in liver   Was seen at cancer center 1/10 -- sounds like at that time pt husband shared she has been lethargic since starting AEDs, and was confused. UA/Ucx were acquired & was started on Cipro for possible UTI (takes daily macrobid for recurrent UTIs) and an MRI brain was ordered. She was thrombocytopenic (29) and anemic (8.2) at that time, but in absence of bleed was not transfused.  Was given 2L IVF for her AKI and mag as well.   Ucx from 1/10 with >100,000 GNR  Pertinent  Medical History  Multiple myeloma Seizure Stroke   Significant Hospital Events: Including procedures, antibiotic start and stop dates in addition to other pertinent events   1/10 outpt cancer center appt. Started on cipro for possible UTI  1/11 [facility] for AMS. Spontaneous SDH. Receiving plt for thrombocytopenia in setting of bleed. Abx to cefepime for UTI (GNR). Transfer to [facility]   1/12 Pt initially resting calmly with prn Morphine and then became severely agitated, husband is ready for full comfort care  1/13 adding some orals to her current reg, trying to get something more analogous to a home reg  Interim History / Subjective:  Periods of apnea   Objective   Blood pressure 103/68, pulse 100, temperature 99.5 F (37.5 C), temperature source Oral, resp. rate 11, height '5\' 4"'$  (1.626 m), SpO2 97 %.        Intake/Output Summary (Last 24 hours) at 07/21/2022 1359 Last data filed at 07/20/2022 2100 Gross  per 24 hour  Intake 50.79 ml  Output 175 ml  Net -124.21 ml   There were no vitals filed for this visit.   General:  ill appearing F NAD  HEENT: tacky pink mm  Neuro: asleep CV: tachycardic  PULM:  symmetrical chest expansion. Apnea followed by stighly labored breathing  GI: round  Extremities: no acute joint deformity  Skin: warm dry   Resolved Hospital Problem list     Assessment & Plan:   Encounter for palliative care DNR status  -in setting of below processes, the patient has transitioned to comfort care -plan for dc 1/14 to hospice -for course of day 1/13, will add some orals (liquids and ODTs) to try to get her comfortable on a reg more analogous to what will be available when she is discharged   Multiple myeloma stg III Hepatic lesions  Acute encephalopathy - probably multifactorial Acute Spontaneous L SDH Seizure disorder Hx CVA w residual aphasia  Acute on chronic anemia Acute on chronic thrombocytopenia Coagulopathy  Elevated alk phos  Hyperammonemia Hyperbilirubinemia  Hepatic steatosis, severe  AKI  Urinary tract infection History of diastolic congestive heart failure   Diet/type: NPO DVT prophylaxis: not indicated GI prophylaxis: N/A Lines: N/A Foley:  N/A Code Status:  DNR Last date of multidisciplinary goals of care discussion: 1/13  Labs   CBC: Recent Labs  Lab 07/18/22 1058 08/06/2022 0928 07/29/2022 1009 08/02/2022 1129 07/26/2022 2005 07/20/22 0330  WBC 9.1 8.1  --   --  8.9 8.2  NEUTROABS 5.1 4.8  --   --   --  4.6  HGB 8.2* 6.4* 6.5*  --  6.2* 7.4*  HCT 25.5* 20.0* 19.0*  --  19.2* 21.8*  MCV 94.4 94.8  --   --  93.7 91.2  PLT 29* 23*  --  22* 36* 40*    Basic Metabolic Panel: Recent Labs  Lab 07/18/22 1058 08/02/2022 0928 07/10/2022 1009 07/20/22 0545  NA 136 138 138 141  K 3.3* 3.3* 3.3* 3.3*  CL 98 102 100 107  CO2 24 25  --  25  GLUCOSE 104* 91 85 86  BUN 32* 31* 28* 22*  CREATININE 1.13* 1.05* 1.10* 0.93  CALCIUM  8.1* 7.6*  --  7.3*  MG 1.1*  --   --  2.0  PHOS  --   --   --  3.6   GFR: CrCl cannot be calculated (Unknown ideal weight.). Recent Labs  Lab 07/18/22 1058 07/09/2022 0928 07/16/2022 0941 07/21/2022 1129 07/20/2022 2005 07/20/22 0330 07/20/22 0545  WBC 9.1 8.1  --   --  8.9 8.2  --   LATICACIDVEN  --   --  2.9* 3.0* 2.3*  --  1.5    Liver Function Tests: Recent Labs  Lab 07/18/22 1058 07/26/2022 0928 07/20/22 0545  AST 31 40 32  ALT 27 32 28  ALKPHOS 240* 193* 171*  BILITOT 1.9* 2.0* 1.7*  PROT 5.3* 4.8* 4.4*  ALBUMIN 2.4* 2.2* 1.9*   No results for input(s): "LIPASE", "AMYLASE" in the last 168 hours. Recent Labs  Lab 07/11/2022 1129 07/20/22 0545  AMMONIA 93* 102*    ABG    Component Value Date/Time   HCO3 28.5 (H) 08/07/2022 1129   TCO2 24 07/13/2022 1009   O2SAT 41.9 07/14/2022 1129     Coagulation Profile: Recent Labs  Lab 07/26/2022 0928 07/21/2022 1129 07/20/22 0545  INR 1.6* 1.6* 1.7*    Cardiac Enzymes: No results for input(s): "CKTOTAL", "CKMB", "CKMBINDEX", "TROPONINI" in the last 168 hours.  HbA1C: HbA1c, POC (prediabetic range)  Date/Time Value Ref Range Status  08/07/2021 10:27 AM 6.1 5.7 - 6.4 % Final   HbA1c, POC (controlled diabetic range)  Date/Time Value Ref Range Status  08/07/2021 10:27 AM 6.1 0.0 - 7.0 % Final   Hgb A1c MFr Bld  Date/Time Value Ref Range Status  06/19/2022 04:16 AM 7.2 (H) 4.8 - 5.6 % Final    Comment:    (NOTE)         Prediabetes: 5.7 - 6.4         Diabetes: >6.4         Glycemic control for adults with diabetes: <7.0   01/30/2022 09:00 AM 6.6 (H) 4.8 - 5.6 % Final    Comment:             Prediabetes: 5.7 - 6.4          Diabetes: >6.4          Glycemic control for adults with diabetes: <7.0     CBG: Recent Labs  Lab 07/26/2022 1950 07/20/2022 2339 07/20/22 0326 07/20/22 0352 07/20/22 0808  GLUCAP 79 70 64* 99 70    CCT: n/a   Eliseo Gum MSN, AGACNP-BC [place] for pager  07/21/2022, 1:59 PM

## 2022-07-21 NOTE — Discharge Summary (Incomplete)
Physician Discharge Summary  Patient ID: Joanna Reid MRN: [id number] DOB/AGE: 11-08-1965 57 y.o.  Admit date: 07/28/2022 Discharge date: 07/21/2022  Problem List Principal Problem:   Altered mental status Active Problems:   Subdural hematoma [facility])  HPI: 57 yo F PMH multiple myeloma, CVA, seizure/SE (hospitalized 06/18/22-07/11/22), anemia/thrombocytopenia who presented to [facility] ED 07/20/2022 with AMS. CT H revealed small L SDH, labs with anemia (6.4) and thrombocytopenia (23), hyperammonemia (91). A CT c/a/p was also acquired which shows incr size of multifocal lesions in liver   Was seen at cancer center 1/10 -- sounds like at that time pt husband shared she has been lethargic since starting AEDs, and was confused. UA/Ucx were acquired & was started on Cipro for possible UTI (takes daily macrobid for recurrent UTIs) and an MRI brain was ordered. She was thrombocytopenic (29) and anemic (8.2) at that time, but in absence of bleed was not transfused.  Was given 2L IVF for her AKI and mag as well.   Hospital Course:  1/11 transferred from [facility] and admitted to [facility] ICU. Received plt.  1/12 transition to comfort care  1/13 adding orals for comfort 1/14 discharge to hospice   ***   Labs at discharge Lab Results  Component Value Date   CREATININE 0.93 07/20/2022   BUN 22 (H) 07/20/2022   NA 141 07/20/2022   K 3.3 (L) 07/20/2022   CL 107 07/20/2022   CO2 25 07/20/2022   Lab Results  Component Value Date   WBC 8.2 07/20/2022   HGB 7.4 (L) 07/20/2022   HCT 21.8 (L) 07/20/2022   MCV 91.2 07/20/2022   PLT 40 (L) 07/20/2022   Lab Results  Component Value Date   ALT 28 07/20/2022   AST 32 07/20/2022   ALKPHOS 171 (H) 07/20/2022   BILITOT 1.7 (H) 07/20/2022   Lab Results  Component Value Date   INR 1.7 (H) 07/20/2022   INR 1.6 (H) 08/06/2022   INR 1.6 (H) 07/12/2022    Current radiology studies No results found.  Disposition: hospice   There are no questions and answers to  display.         Allergies as of 07/21/2022   No Known Allergies   Med Rec must be completed prior to using this [facility]***         Discharged Condition: serious  Time spent on discharge greater than 40 minutes.  Vital signs at Discharge. Temp:  [99.5 F (37.5 C)] 99.5 F (37.5 C) (01/13 0759) Pulse Rate:  [95-100] 100 (01/13 0759) Resp:  [6-11] 11 (01/13 1004) BP: (103)/(68-90) 103/68 (01/13 0759) SpO2:  [94 %-97 %] 97 % (01/13 1004) Office follow up Special Information or instructions. Signed: '@[email or web address]'$ @

## 2022-07-21 NOTE — TOC Progression Note (Signed)
Transition of Care [facility]) - Progression Note    Patient Details  Name: Joanna Reid MRN: [id number] Date of Birth: Feb 14, 1966  Transition of Care [facility]) CM/SW Contact  Bartholomew Crews, RN Phone Number: [phone] 07/21/2022, 2:42 PM  Clinical Narrative:     Received call from Apolonio Schneiders at [facility] - referral accepted. Patient will convert from continuous IV mophine to SL morphine today to ensure that SL will meet patient needs. Spoke with family to discuss transition home - address confirmed, PTAR arranged for 9am, and medical necessity completed and placed on shadow chart. DNR placed on shadow chart pending provider signature. Hospice to admit tomorrow. TOC following for transition needs.   Expected Discharge Plan: Home w Hospice Care Barriers to Discharge: Continued Medical Work up  Expected Discharge Plan and Services   Discharge Planning Services: CM Consult Post Acute Care Choice: Hospice Living arrangements for the past 2 months: Single Family Home                                       Social Determinants of Health (SDOH) Interventions SDOH Screenings   Food Insecurity: No Food Insecurity (06/19/2022)  Housing: Low Risk  (06/19/2022)  Transportation Needs: No Transportation Needs (06/19/2022)  Utilities: Not At Risk (06/19/2022)  Alcohol Screen: Low Risk  (08/08/2021)  Depression (PHQ2-9): Low Risk  (04/19/2022)  Financial Resource Strain: Low Risk  (08/08/2021)  Physical Activity: Unknown (08/08/2021)  Social Connections: Socially Integrated (08/08/2021)  Stress: No Stress Concern Present (08/08/2021)  Tobacco Use: Medium Risk (07/28/2022)    Readmission Risk Interventions     No data to display

## 2022-07-21 NOTE — TOC Progression Note (Signed)
Transition of Care [facility]) - Progression Note    Patient Details  Name: Joanna Reid MRN: [id number] Date of Birth: 10/21/1965  Transition of Care [facility]) CM/SW Contact  Bartholomew Crews, RN Phone Number: [phone] 07/21/2022, 10:31 AM  Clinical Narrative:     Message left with answering service at [facility] to follow up with referral placed by previous RN CM.   Expected Discharge Plan: Home w Hospice Care Barriers to Discharge: Continued Medical Work up  Expected Discharge Plan and Services   Discharge Planning Services: CM Consult Post Acute Care Choice: Hospice Living arrangements for the past 2 months: Single Family Home                                       Social Determinants of Health (SDOH) Interventions SDOH Screenings   Food Insecurity: No Food Insecurity (06/19/2022)  Housing: Low Risk  (06/19/2022)  Transportation Needs: No Transportation Needs (06/19/2022)  Utilities: Not At Risk (06/19/2022)  Alcohol Screen: Low Risk  (08/08/2021)  Depression (PHQ2-9): Low Risk  (04/19/2022)  Financial Resource Strain: Low Risk  (08/08/2021)  Physical Activity: Unknown (08/08/2021)  Social Connections: Socially Integrated (08/08/2021)  Stress: No Stress Concern Present (08/08/2021)  Tobacco Use: Medium Risk (07/23/2022)    Readmission Risk Interventions     No data to display

## 2022-07-21 NOTE — TOC Progression Note (Addendum)
Transition of Care [facility]) - Progression Note    Patient Details  Name: Joanna Reid MRN: [id number] Date of Birth: [birth year]  Transition of Care [facility]) CM/SW Contact  Bartholomew Crews, RN Phone Number: [phone] 07/21/2022, 5:09 PM  Clinical Narrative:     NUUVOZ [id number]: Received call from Apolonio Schneiders, liaison from [place] to send order to pharmacy which will be delivered to hospice home where hospice nurse will pick up and deliver to patient home tomorrow in time for admission meeting. Bedside nurse to coordinate with IV team to disconnect IV when PTAR arrives.   1700: Received call from [place], Apolonio Schneiders, that patient had not tolerated taking SL morphine d/t coughing. Apolonio Schneiders stated that patient could continue continuous morphine at home with a PCA, but prescription was needed to be sent to [facility] in order to be processed. Patient is currently received morphine continuous infusion at '100mg'$ /179m with current rate at '2mg'$ /hr. Prescription unable to be processed from inpatient to outpatient at this time. If medication can be processed at time of discharge may be [facility]already [facility] or by faxing a printed prescription to [phone]   Spoke with patient's mother in law at the bedside along with patient's husband, [name] on speaker phone to discuss barriers to home with hospice on IV morphine. Advised that PTAR transport was paused until knowing if home medication needed could be arranged. PReather Conversewas understanding. Primary goal at this time is maintaining patient's comfort. [facility] at time of patient arrival to home.   Expected Discharge Plan: Home w Hospice Care Barriers to Discharge: Continued Medical Work up  Expected Discharge Plan and Services   Discharge Planning Services: CM Consult Post Acute Care Choice: Hospice Living  arrangements for the past 2 months: Single Family Home                                       Social Determinants of Health (SDOH) Interventions SDOH Screenings   Food Insecurity: No Food Insecurity (06/19/2022)  Housing: Low Risk  (06/19/2022)  Transportation Needs: No Transportation Needs (06/19/2022)  Utilities: Not At Risk (06/19/2022)  Alcohol Screen: Low Risk  (08/08/2021)  Depression (PHQ2-9): Low Risk  (04/19/2022)  Financial Resource Strain: Low Risk  (08/08/2021)  Physical Activity: Unknown (08/08/2021)  Social Connections: Socially Integrated (08/08/2021)  Stress: No Stress Concern Present (08/08/2021)  Tobacco Use: Medium Risk (08/03/2022)    Readmission Risk Interventions     No data to display

## 2022-07-22 DIAGNOSIS — R4182 Altered mental status, unspecified: Secondary | ICD-10-CM | POA: Diagnosis not present

## 2022-07-22 MED ORDER — MORPHINE SULFATE (PF) 4 MG/ML IV SOLN
INTRAVENOUS | Status: AC
  Filled 2022-07-22: qty 1

## 2022-07-22 MED ORDER — MIDAZOLAM HCL 2 MG/2ML IJ SOLN
2.0000 mg | INTRAMUSCULAR | Status: DC | PRN
  Administered 2022-07-24: 2 mg via INTRAVENOUS
  Administered 2022-07-25 (×2): 4 mg via INTRAVENOUS
  Filled 2022-07-22 (×2): qty 4
  Filled 2022-07-22: qty 2

## 2022-07-22 MED ORDER — MORPHINE BOLUS VIA INFUSION
2.0000 mg | INTRAVENOUS | Status: DC | PRN
  Administered 2022-07-22: 2 mg via INTRAVENOUS
  Administered 2022-07-23 (×2): 5 mg via INTRAVENOUS
  Administered 2022-07-24: 4 mg via INTRAVENOUS
  Administered 2022-07-24 (×2): 2 mg via INTRAVENOUS
  Administered 2022-07-24 – 2022-07-25 (×5): 4 mg via INTRAVENOUS

## 2022-07-22 NOTE — Progress Notes (Signed)
NAME:  Joanna Reid, MRN:  [id number], DOB:  08/01/1965, LOS: 3 ADMISSION DATE:  07/16/2022, CONSULTATION DATE:  07/31/2022 REFERRING MD:  Kommor - [facility] EM, CHIEF COMPLAINT:  AMS   History of Present Illness:   57 yo F PMH multiple myeloma, CVA, seizure/SE (hospitalized 06/18/22-07/11/22), anemia/thrombocytopenia who presented to [facility] ED 08/08/2022 with AMS. CT H revealed small L SDH, labs with anemia (6.4) and thrombocytopenia (23), hyperammonemia (91). A CT c/a/p was also acquired which shows incr size of multifocal lesions in liver   Was seen at cancer center 1/10 -- sounds like at that time pt husband shared she has been lethargic since starting AEDs, and was confused. UA/Ucx were acquired & was started on Cipro for possible UTI (takes daily macrobid for recurrent UTIs) and an MRI brain was ordered. She was thrombocytopenic (29) and anemic (8.2) at that time, but in absence of bleed was not transfused.  Was given 2L IVF for her AKI and mag as well.   Ucx from 1/10 with >100,000 GNR  Pertinent  Medical History  Multiple myeloma Seizure Stroke   Significant Hospital Events: Including procedures, antibiotic start and stop dates in addition to other pertinent events   1/10 outpt cancer center appt. Started on cipro for possible UTI  1/11 [facility] for AMS. Spontaneous SDH. Receiving plt for thrombocytopenia in setting of bleed. Abx to cefepime for UTI (GNR). Transfer to [facility]   1/12 Pt initially resting calmly with prn Morphine and then became severely agitated, husband is ready for full comfort care  1/13 adding some orals to her current reg, trying to get something more analogous to a home reg. She started to cough as the day went home which fam/friends attributed to the POs though timing wise doesn't fit. Was getting regular mouth sponges per fam/friend request so getting a fair bit of liquids in her mouth -- think its more likely that she is simply progressing toward EOL and coughing.  1/14 family  deciding if still wanting home hospice based on sx progression and need for rapidly changing morphine gtt and bolus need   Interim History / Subjective:   Overnight had incr morphine gtt and PRN need going from '1mg'$  gtt and infrequent PRN to '4mg'$  gtt and multiple PRN bolus  This morning I incr her gtt rate to '6mg'$  and bolus '4mg'$ . Still with inadequate sx management.  Ultimately, incr gtt to '10mg'$  and based on sx received additional '2mg'$  bolus  Objective   Blood pressure 103/68, pulse 100, temperature 99.5 F (37.5 C), temperature source Oral, resp. rate 11, height '5\' 4"'$  (1.626 m), SpO2 97 %.        Intake/Output Summary (Last 24 hours) at 07/22/2022 0809 Last data filed at 07/22/2022 0700 Gross per 24 hour  Intake 79.66 ml  Output 300 ml  Net -220.34 ml   There were no vitals filed for this visit.  General:  Ill appearing F, actively dying  HEENT: pink mmm  Neuro: asleep CV: tachycardic, faint periph pulses  PULM:  Accessory muscle use. Gurgling respirations and periods of apnea  GI: soft round  Extremities: edema  Skin: pale warm dry  Resolved Hospital Problem list     Assessment & Plan:   Encounter for palliative care DNR status  -transitioned to comfort care -plan was for POs and dc home hospice. Had incr secretions 1/13 and this was distressing to family/friends so orals no longer feasible. Plan was then for a PCA at home, Rx by accepting  palliative MD.  -1/14 -- the patient is having rapidly incr IV morphine need. I do not think the concept of a PCA will be adequate for managing her EOL respiratory sx given the evolving adjustments actively needed to manage her sx -Family is discussing if home hospice is still what is desired, I have encouraged consideration of in-hospital death.   Multiple myeloma stg III Hepatic lesions  Acute encephalopathy - probably multifactorial Acute Spontaneous L SDH Seizure disorder Hx CVA w residual aphasia  Acute on chronic anemia Acute on  chronic thrombocytopenia Coagulopathy  Elevated alk phos  Hyperammonemia Hyperbilirubinemia  Hepatic steatosis, severe  AKI  Urinary tract infection History of diastolic congestive heart failure   Diet/type: NPO DVT prophylaxis: not indicated GI prophylaxis: N/A Lines: N/A Foley:  N/A Code Status:  DNR Last date of multidisciplinary goals of care discussion: 1/13  Labs   CBC: Recent Labs  Lab 07/18/22 1058 07/27/2022 0928 07/26/2022 1009 07/29/2022 1129 07/21/2022 2005 07/20/22 0330  WBC 9.1 8.1  --   --  8.9 8.2  NEUTROABS 5.1 4.8  --   --   --  4.6  HGB 8.2* 6.4* 6.5*  --  6.2* 7.4*  HCT 25.5* 20.0* 19.0*  --  19.2* 21.8*  MCV 94.4 94.8  --   --  93.7 91.2  PLT 29* 23*  --  22* 36* 40*    Basic Metabolic Panel: Recent Labs  Lab 07/18/22 1058 07/16/2022 0928 07/18/2022 1009 07/20/22 0545  NA 136 138 138 141  K 3.3* 3.3* 3.3* 3.3*  CL 98 102 100 107  CO2 24 25  --  25  GLUCOSE 104* 91 85 86  BUN 32* 31* 28* 22*  CREATININE 1.13* 1.05* 1.10* 0.93  CALCIUM 8.1* 7.6*  --  7.3*  MG 1.1*  --   --  2.0  PHOS  --   --   --  3.6   GFR: CrCl cannot be calculated (Unknown ideal weight.). Recent Labs  Lab 07/18/22 1058 07/15/2022 0928 07/24/2022 0941 07/16/2022 1129 07/24/2022 2005 07/20/22 0330 07/20/22 0545  WBC 9.1 8.1  --   --  8.9 8.2  --   LATICACIDVEN  --   --  2.9* 3.0* 2.3*  --  1.5    Liver Function Tests: Recent Labs  Lab 07/18/22 1058 07/21/2022 0928 07/20/22 0545  AST 31 40 32  ALT 27 32 28  ALKPHOS 240* 193* 171*  BILITOT 1.9* 2.0* 1.7*  PROT 5.3* 4.8* 4.4*  ALBUMIN 2.4* 2.2* 1.9*   No results for input(s): "LIPASE", "AMYLASE" in the last 168 hours. Recent Labs  Lab 08/03/2022 1129 07/20/22 0545  AMMONIA 93* 102*    ABG    Component Value Date/Time   HCO3 28.5 (H) 07/15/2022 1129   TCO2 24 07/14/2022 1009   O2SAT 41.9 08/06/2022 1129     Coagulation Profile: Recent Labs  Lab 07/16/2022 0928 07/16/2022 1129 07/20/22 0545  INR 1.6* 1.6*  1.7*    Cardiac Enzymes: No results for input(s): "CKTOTAL", "CKMB", "CKMBINDEX", "TROPONINI" in the last 168 hours.  HbA1C: HbA1c, POC (prediabetic range)  Date/Time Value Ref Range Status  08/07/2021 10:27 AM 6.1 5.7 - 6.4 % Final   HbA1c, POC (controlled diabetic range)  Date/Time Value Ref Range Status  08/07/2021 10:27 AM 6.1 0.0 - 7.0 % Final   Hgb A1c MFr Bld  Date/Time Value Ref Range Status  06/19/2022 04:16 AM 7.2 (H) 4.8 - 5.6 % Final    Comment:    (  NOTE)         Prediabetes: 5.7 - 6.4         Diabetes: >6.4         Glycemic control for adults with diabetes: <7.0   01/30/2022 09:00 AM 6.6 (H) 4.8 - 5.6 % Final    Comment:             Prediabetes: 5.7 - 6.4          Diabetes: >6.4          Glycemic control for adults with diabetes: <7.0     CBG: Recent Labs  Lab 08/06/2022 1950 08/02/2022 2339 07/20/22 0326 07/20/22 0352 07/20/22 0808  GLUCAP 79 70 64* 99 70    CCT: n/a   Eliseo Gum MSN, AGACNP-BC [place] for pager  07/22/2022, 8:09 AM

## 2022-07-22 NOTE — TOC Progression Note (Signed)
Transition of Care [facility]) - Progression Note    Patient Details  Name: Joanna Reid MRN: [id number] Date of Birth: 02-18-1966  Transition of Care [facility]) CM/SW Contact  Bartholomew Crews, RN Phone Number: [phone] 07/22/2022, 8:32 AM  Clinical Narrative:     Continuing to follow patient for transition needs. Per chart review, noted increasing needs for symptom management and adjustments of morphine infusion. Per nursing, respirations 4/minute. Noted in provider notes of anticipated hospital death vs.home hospice.   Expected Discharge Plan: Home w Hospice Care Barriers to Discharge: Continued Medical Work up  Expected Discharge Plan and Services   Discharge Planning Services: CM Consult Post Acute Care Choice: Hospice Living arrangements for the past 2 months: Single Family Home                                       Social Determinants of Health (SDOH) Interventions SDOH Screenings   Food Insecurity: No Food Insecurity (06/19/2022)  Housing: Low Risk  (06/19/2022)  Transportation Needs: No Transportation Needs (06/19/2022)  Utilities: Not At Risk (06/19/2022)  Alcohol Screen: Low Risk  (08/08/2021)  Depression (PHQ2-9): Low Risk  (04/19/2022)  Financial Resource Strain: Low Risk  (08/08/2021)  Physical Activity: Unknown (08/08/2021)  Social Connections: Socially Integrated (08/08/2021)  Stress: No Stress Concern Present (08/08/2021)  Tobacco Use: Medium Risk (07/23/2022)    Readmission Risk Interventions     No data to display

## 2022-07-23 ENCOUNTER — Inpatient Hospital Stay: Payer: BC Managed Care – PPO | Admitting: Adult Health

## 2022-07-23 ENCOUNTER — Encounter: Payer: Self-pay | Admitting: Adult Health

## 2022-07-23 DIAGNOSIS — Z515 Encounter for palliative care: Secondary | ICD-10-CM

## 2022-07-23 LAB — TYPE AND SCREEN
ABO/RH(D): A POS
Antibody Screen: POSITIVE
Donor AG Type: NEGATIVE
Donor AG Type: NEGATIVE
Unit division: 0
Unit division: 0

## 2022-07-23 LAB — BPAM RBC
Blood Product Expiration Date: 202402112359
Blood Product Expiration Date: 202402112359
ISSUE DATE / TIME: 202401112221
Unit Type and Rh: 6200
Unit Type and Rh: 6200

## 2022-07-23 LAB — PATHOLOGIST SMEAR REVIEW: Path Review: NEGATIVE

## 2022-07-23 NOTE — Progress Notes (Signed)
PROGRESS NOTE    Joanna Reid  WUJ:[id number] DOB: June 16, 1966 DOA: 07/22/2022 PCP: Lindell Spar, MD   Brief Narrative:  57 year old female with history of multiple myeloma, unspecified CVA, seizures, anemia thrombocytopenia presented with altered mental status.  CT head revealed small left subdural hematoma along with anemia and thrombocytopenia along with hyperammonemia.  CT chest/abdomen and pelvis showed increased size of multifocal lesions in the liver.  She was transferred from [facility] and admitted to ICU by PCCM.  Because of overall very poor prognosis, family decided to pursue comfort measures.  She was started on morphine drip on 07/22/2022.  She was transferred to [facility] service from 07/23/2022 onwards.  Assessment & Plan:   Comfort measures only status Multiple myeloma Hepatic lesions Acute encephalopathy: Probably multifactorial Acute/spontaneous left subdural hematoma Seizure disorder History of unspecified CVA with residual aphasia-acute on chronic anemia-acute on chronic thrombocytopenia Coagulopathy Elevated alkaline phosphatase Hyperammonemia Hyperbilirubinemia hepatic steatosis Acute kidney injury Urinary tract infection: Present on admission History of diastolic congestive heart failure  Plan -As discussed above, continue comfort measures with morphine drip.  Expect in-hospital death.   DVT prophylaxis: None for comfort measures Code Status: DNR Family Communication: Husband at bedside Disposition Plan: Status is: Inpatient Remains inpatient appropriate because: Of severity of illness.  Expect in-hospital death    Consultants: PCCM  Procedures: None  Antimicrobials: None   Subjective: Patient seen and examined at bedside.  Unconscious but looks to be in no distress  Objective: Vitals:   07/20/22 2124 07/21/22 0759 07/21/22 1004 07/23/22 0859  BP: (!) 103/90 103/68  (!) 72/51  Pulse:  100  (!) 102  Resp:  '10 11 11  '$ Temp:  99.5 F  (37.5 C)  99 F (37.2 C)  TempSrc:  Oral  Oral  SpO2:  95% 97% (!) 85%  Height:        Intake/Output Summary (Last 24 hours) at 07/23/2022 1020 Last data filed at 07/22/2022 1200 Gross per 24 hour  Intake 53.21 ml  Output --  Net 53.21 ml   There were no vitals filed for this visit.  Examination:  General exam: Chronically ill and deconditioned.  Unconscious but not in any distress    Data Reviewed: I have personally reviewed following labs and imaging studies  CBC: Recent Labs  Lab 07/18/22 1058 07/24/2022 0928 08/07/2022 1009 07/24/2022 1129 07/16/2022 2005 07/20/22 0330  WBC 9.1 8.1  --   --  8.9 8.2  NEUTROABS 5.1 4.8  --   --   --  4.6  HGB 8.2* 6.4* 6.5*  --  6.2* 7.4*  HCT 25.5* 20.0* 19.0*  --  19.2* 21.8*  MCV 94.4 94.8  --   --  93.7 91.2  PLT 29* 23*  --  22* 36* 40*   Basic Metabolic Panel: Recent Labs  Lab 07/18/22 1058 07/18/2022 0928 07/18/2022 1009 07/20/22 0545  NA 136 138 138 141  K 3.3* 3.3* 3.3* 3.3*  CL 98 102 100 107  CO2 24 25  --  25  GLUCOSE 104* 91 85 86  BUN 32* 31* 28* 22*  CREATININE 1.13* 1.05* 1.10* 0.93  CALCIUM 8.1* 7.6*  --  7.3*  MG 1.1*  --   --  2.0  PHOS  --   --   --  3.6   GFR: CrCl cannot be calculated (Unknown ideal weight.). Liver Function Tests: Recent Labs  Lab 07/18/22 1058 07/14/2022 0928 07/20/22 0545  AST 31 40 32  ALT 27  32 28  ALKPHOS 240* 193* 171*  BILITOT 1.9* 2.0* 1.7*  PROT 5.3* 4.8* 4.4*  ALBUMIN 2.4* 2.2* 1.9*   No results for input(s): "LIPASE", "AMYLASE" in the last 168 hours. Recent Labs  Lab 08/06/2022 1129 07/20/22 0545  AMMONIA 93* 102*   Coagulation Profile: Recent Labs  Lab 07/18/2022 0928 08/02/2022 1129 07/20/22 0545  INR 1.6* 1.6* 1.7*   Cardiac Enzymes: No results for input(s): "CKTOTAL", "CKMB", "CKMBINDEX", "TROPONINI" in the last 168 hours. BNP (last 3 results) No results for input(s): "PROBNP" in the last 8760 hours. HbA1C: No results for input(s): "HGBA1C" in the last 72  hours. CBG: Recent Labs  Lab 07/12/2022 1950 07/18/2022 2339 07/20/22 0326 07/20/22 0352 07/20/22 0808  GLUCAP 79 70 64* 99 70   Lipid Profile: No results for input(s): "CHOL", "HDL", "LDLCALC", "TRIG", "CHOLHDL", "LDLDIRECT" in the last 72 hours. Thyroid Function Tests: No results for input(s): "TSH", "T4TOTAL", "FREET4", "T3FREE", "THYROIDAB" in the last 72 hours. Anemia Panel: No results for input(s): "VITAMINB12", "FOLATE", "FERRITIN", "TIBC", "IRON", "RETICCTPCT" in the last 72 hours. Sepsis Labs: Recent Labs  Lab 07/09/2022 0941 07/14/2022 1129 07/23/2022 2005 07/20/22 0545  LATICACIDVEN 2.9* 3.0* 2.3* 1.5    Recent Results (from the past 240 hour(s))  Urine Culture     Status: Abnormal   Collection Time: 07/18/22  1:45 PM   Specimen: Urine, Random  Result Value Ref Range Status   Specimen Description   Final    URINE, RANDOM Performed at [facility], [address] [place], [place] [address]    Special Requests   Final    NONE Performed at [facility], [address], [place], [place] [address]    Culture >=100,000 COLONIES/mL KLEBSIELLA PNEUMONIAE (A)  Final   Report Status 07/20/2022 FINAL  Final   Organism ID, Bacteria KLEBSIELLA PNEUMONIAE (A)  Final      Susceptibility   Klebsiella pneumoniae - MIC*    AMPICILLIN RESISTANT Resistant     CEFAZOLIN <=4 SENSITIVE Sensitive     CEFEPIME <=0.12 SENSITIVE Sensitive     CEFTRIAXONE <=0.25 SENSITIVE Sensitive     CIPROFLOXACIN 0.5 INTERMEDIATE Intermediate     GENTAMICIN <=1 SENSITIVE Sensitive     IMIPENEM <=0.25 SENSITIVE Sensitive     NITROFURANTOIN 128 RESISTANT Resistant     TRIMETH/SULFA <=20 SENSITIVE Sensitive     AMPICILLIN/SULBACTAM 4 SENSITIVE Sensitive     PIP/TAZO 8 SENSITIVE Sensitive     * >=100,000 COLONIES/mL KLEBSIELLA PNEUMONIAE  Urine Culture     Status: None   Collection Time: 07/23/2022  9:30 AM   Specimen: Urine, Catheterized  Result Value Ref Range Status   Specimen Description   Final     URINE, CATHETERIZED Performed at [facility], [address], [place], [place] [address]    Special Requests   Final    NONE Performed at [facility], [address] [place], [place] [address]    Culture   Final    NO GROWTH Performed at [facility], [place] [address] [place], [place] [address]    Report Status 07/20/2022 FINAL  Final  Resp panel by RT-PCR (RSV, Flu A&B, Covid) Anterior Nasal Swab     Status: None   Collection Time: 07/17/2022  9:42 AM   Specimen: Anterior Nasal Swab  Result Value Ref Range Status   SARS Coronavirus 2 by RT PCR NEGATIVE NEGATIVE Final    Comment: (NOTE) SARS-CoV-2 target nucleic acids are NOT DETECTED.  The SARS-CoV-2 RNA is generally  detectable in upper respiratory specimens during the acute phase of infection. The lowest concentration of SARS-CoV-2 viral copies this assay can detect is 138 copies/mL. A negative result does not preclude SARS-Cov-2 infection and should not be used as the sole basis for treatment or other patient management decisions. A negative result may occur with  improper specimen collection/handling, submission of specimen other than nasopharyngeal swab, presence of viral mutation(s) within the areas targeted by this assay, and inadequate number of viral copies(<138 copies/mL). A negative result must be combined with clinical observations, patient history, and epidemiological information. The expected result is Negative.  Fact Sheet for Patients:  EntrepreneurPulse.com.au  Fact Sheet for Healthcare Providers:  IncredibleEmployment.be  This test is no t yet approved or cleared by the [place] FDA and  has been authorized for detection and/or diagnosis of SARS-CoV-2 by FDA under an Emergency Use Authorization (EUA). This EUA will remain  in effect (meaning this test can be used) for the duration of the COVID-19 declaration under Section 564(b)(1) of the Act, 21 U.S.C.section  360bbb-3(b)(1), unless the authorization is terminated  or revoked sooner.       Influenza A by PCR NEGATIVE NEGATIVE Final   Influenza B by PCR NEGATIVE NEGATIVE Final    Comment: (NOTE) The Xpert Xpress SARS-CoV-2/FLU/RSV plus assay is intended as an aid in the diagnosis of influenza from Nasopharyngeal swab specimens and should not be used as a sole basis for treatment. Nasal washings and aspirates are unacceptable for Xpert Xpress SARS-CoV-2/FLU/RSV testing.  Fact Sheet for Patients: EntrepreneurPulse.com.au  Fact Sheet for Healthcare Providers: IncredibleEmployment.be  This test is not yet approved or cleared by the [place] FDA and has been authorized for detection and/or diagnosis of SARS-CoV-2 by FDA under an Emergency Use Authorization (EUA). This EUA will remain in effect (meaning this test can be used) for the duration of the COVID-19 declaration under Section 564(b)(1) of the Act, 21 U.S.C. section 360bbb-3(b)(1), unless the authorization is terminated or revoked.     Resp Syncytial Virus by PCR NEGATIVE NEGATIVE Final    Comment: (NOTE) Fact Sheet for Patients: EntrepreneurPulse.com.au  Fact Sheet for Healthcare Providers: IncredibleEmployment.be  This test is not yet approved or cleared by the [place] FDA and has been authorized for detection and/or diagnosis of SARS-CoV-2 by FDA under an Emergency Use Authorization (EUA). This EUA will remain in effect (meaning this test can be used) for the duration of the COVID-19 declaration under Section 564(b)(1) of the Act, 21 U.S.C. section 360bbb-3(b)(1), unless the authorization is terminated or revoked.  Performed at [facility], [address], [place], [place] [address]   Culture, blood (routine x 2)     Status: None (Preliminary result)   Collection Time: 07/27/2022 11:29 AM   Specimen: Right Antecubital; Blood  Result Value Ref  Range Status   Specimen Description RIGHT ANTECUBITAL  Final   Special Requests   Final    BOTTLES DRAWN AEROBIC AND ANAEROBIC Blood Culture adequate volume   Culture   Final    NO GROWTH 4 DAYS Performed at [facility], [address], [place], [place] [address]    Report Status PENDING  Incomplete  Culture, blood (routine x 2)     Status: None (Preliminary result)   Collection Time: 08/01/2022 11:29 AM   Specimen: Left Antecubital; Blood  Result Value Ref Range Status   Specimen Description LEFT ANTECUBITAL  Final   Special Requests   Final    BOTTLES DRAWN AEROBIC AND ANAEROBIC  Blood Culture results may not be optimal due to an excessive volume of blood received in culture bottles   Culture   Final    NO GROWTH 4 DAYS Performed at [facility], [address], [place], [place] [address]    Report Status PENDING  Incomplete  MRSA Next Gen by PCR, Nasal     Status: None   Collection Time: 07/09/2022  5:40 PM   Specimen: Nasal Mucosa; Nasal Swab  Result Value Ref Range Status   MRSA by PCR Next Gen NOT DETECTED NOT DETECTED Final    Comment: (NOTE) The GeneXpert MRSA Assay (FDA approved for NASAL specimens only), is one component of a comprehensive MRSA colonization surveillance program. It is not intended to diagnose MRSA infection nor to guide or monitor treatment for MRSA infections. Test performance is not FDA approved in patients less than 74 years old. Performed at [facility], [place] [address], [place], [place] [address]          Radiology Studies: No results found.      Scheduled Meds:  mouth rinse  15 mL Mouth Rinse 4 times per day   scopolamine  1 patch Transdermal Q72H   Continuous Infusions:  sodium chloride Stopped (07/20/22 1704)   morphine 12 mg/hr (07/23/22 0347)          Aline August, MD [facility] 07/23/2022, 10:20 AM

## 2022-07-24 DIAGNOSIS — Z515 Encounter for palliative care: Secondary | ICD-10-CM | POA: Diagnosis not present

## 2022-07-24 LAB — CBC WITH DIFFERENTIAL/PLATELET
Abs Immature Granulocytes: 0.28 10*3/uL — ABNORMAL HIGH (ref 0.00–0.07)
Basophils Absolute: 0 10*3/uL (ref 0.0–0.1)
Basophils Relative: 0 %
Eosinophils Absolute: 0 10*3/uL (ref 0.0–0.5)
Eosinophils Relative: 0 %
HCT: 25.5 % — ABNORMAL LOW (ref 36.0–46.0)
Hemoglobin: 8.2 g/dL — ABNORMAL LOW (ref 12.0–15.0)
Immature Granulocytes: 3 %
Lymphocytes Relative: 24 %
Lymphs Abs: 2.2 10*3/uL (ref 0.7–4.0)
MCH: 30.4 pg (ref 26.0–34.0)
MCHC: 32.2 g/dL (ref 30.0–36.0)
MCV: 94.4 fL (ref 80.0–100.0)
Monocytes Absolute: 1.5 10*3/uL — ABNORMAL HIGH (ref 0.1–1.0)
Monocytes Relative: 16 %
Neutro Abs: 5.1 10*3/uL (ref 1.7–7.7)
Neutrophils Relative %: 57 %
Platelets: 29 10*3/uL — CL (ref 150–400)
RBC: 2.7 MIL/uL — ABNORMAL LOW (ref 3.87–5.11)
RDW: 19.3 % — ABNORMAL HIGH (ref 11.5–15.5)
WBC: 9.1 10*3/uL (ref 4.0–10.5)
nRBC: 0.4 % — ABNORMAL HIGH (ref 0.0–0.2)

## 2022-07-24 LAB — CULTURE, BLOOD (ROUTINE X 2)
Culture: NO GROWTH
Culture: NO GROWTH
Special Requests: ADEQUATE

## 2022-07-24 LAB — IMMUNOFIXATION ELECTROPHORESIS
IgA: 16 mg/dL — ABNORMAL LOW (ref 87–352)
IgG (Immunoglobin G), Serum: 1213 mg/dL (ref 586–1602)
IgM (Immunoglobulin M), Srm: 16 mg/dL — ABNORMAL LOW (ref 26–217)
Total Protein ELP: 5.1 g/dL — ABNORMAL LOW (ref 6.0–8.5)

## 2022-07-24 NOTE — Progress Notes (Signed)
PROGRESS NOTE    Joanna Reid  ONG:[id number] DOB: [birth year] DOA: 08/01/2022 PCP: Lindell Spar, MD   Brief Narrative:  57 year old female with history of multiple myeloma, unspecified CVA, seizures, anemia thrombocytopenia presented with altered mental status.  CT head revealed small left subdural hematoma along with anemia and thrombocytopenia along with hyperammonemia.  CT chest/abdomen and pelvis showed increased size of multifocal lesions in the liver.  She was transferred from [facility] and admitted to ICU by PCCM.  Because of overall very poor prognosis, family decided to pursue comfort measures.  She was started on morphine drip on 07/22/2022.  She was transferred to [facility] service from 07/23/2022 onwards.  Assessment & Plan:   Comfort measures only status Multiple myeloma Hepatic lesions Acute encephalopathy: Probably multifactorial Acute/spontaneous left subdural hematoma Seizure disorder History of unspecified CVA with residual aphasia-acute on chronic anemia-acute on chronic thrombocytopenia Coagulopathy Elevated alkaline phosphatase Hyperammonemia Hyperbilirubinemia hepatic steatosis Acute kidney injury Urinary tract infection: Present on admission History of diastolic congestive heart failure  Plan -As discussed above, continue comfort measures with morphine drip.  Expect in-hospital death.   DVT prophylaxis: None for comfort measures Code Status: DNR Family Communication: Husband at bedside Disposition Plan: Status is: Inpatient Remains inpatient appropriate because: Of severity of illness.  Expect in-hospital death    Consultants: PCCM  Procedures: None  Antimicrobials: None   Subjective: Patient seen and examined at bedside.  Looks unconscious but in no distress Objective: Vitals:   07/20/22 2124 07/21/22 0759 07/21/22 1004 07/23/22 0859  BP: (!) 103/90 103/68  (!) 72/51  Pulse:  100  (!) 102  Resp:  '10 11 11  '$ Temp:  99.5 F (37.5  C)  99 F (37.2 C)  TempSrc:  Oral  Oral  SpO2:  95% 97% (!) 85%  Height:        Intake/Output Summary (Last 24 hours) at 07/24/2022 0745 Last data filed at 07/24/2022 4401 Gross per 24 hour  Intake 386.74 ml  Output 480 ml  Net -93.26 ml    There were no vitals filed for this visit.  Examination:  General exam: Does not look to be in any distress; unconscious.  Chronically ill and deconditioned.    Data Reviewed: I have personally reviewed following labs and imaging studies  CBC: Recent Labs  Lab 07/18/22 1058 08/01/2022 0928 07/09/2022 1009 08/06/2022 1129 08/05/2022 2005 07/20/22 0330  WBC 9.1 8.1  --   --  8.9 8.2  NEUTROABS 5.1 4.8  --   --   --  4.6  HGB 8.2* 6.4* 6.5*  --  6.2* 7.4*  HCT 25.5* 20.0* 19.0*  --  19.2* 21.8*  MCV 94.4 94.8  --   --  93.7 91.2  PLT 29* 23*  --  22* 36* 40*    Basic Metabolic Panel: Recent Labs  Lab 07/18/22 1058 08/06/2022 0928 07/31/2022 1009 07/20/22 0545  NA 136 138 138 141  K 3.3* 3.3* 3.3* 3.3*  CL 98 102 100 107  CO2 24 25  --  25  GLUCOSE 104* 91 85 86  BUN 32* 31* 28* 22*  CREATININE 1.13* 1.05* 1.10* 0.93  CALCIUM 8.1* 7.6*  --  7.3*  MG 1.1*  --   --  2.0  PHOS  --   --   --  3.6    GFR: CrCl cannot be calculated (Unknown ideal weight.). Liver Function Tests: Recent Labs  Lab 07/18/22 1058 07/11/2022 0928 07/20/22 0545  AST 31 40  32  ALT 27 32 28  ALKPHOS 240* 193* 171*  BILITOT 1.9* 2.0* 1.7*  PROT 5.3* 4.8* 4.4*  ALBUMIN 2.4* 2.2* 1.9*    No results for input(s): "LIPASE", "AMYLASE" in the last 168 hours. Recent Labs  Lab 07/26/2022 1129 07/20/22 0545  AMMONIA 93* 102*    Coagulation Profile: Recent Labs  Lab 07/30/2022 0928 07/18/2022 1129 07/20/22 0545  INR 1.6* 1.6* 1.7*    Cardiac Enzymes: No results for input(s): "CKTOTAL", "CKMB", "CKMBINDEX", "TROPONINI" in the last 168 hours. BNP (last 3 results) No results for input(s): "PROBNP" in the last 8760 hours. HbA1C: No results for  input(s): "HGBA1C" in the last 72 hours. CBG: Recent Labs  Lab 08/02/2022 1950 07/21/2022 2339 07/20/22 0326 07/20/22 0352 07/20/22 0808  GLUCAP 79 70 64* 99 70    Lipid Profile: No results for input(s): "CHOL", "HDL", "LDLCALC", "TRIG", "CHOLHDL", "LDLDIRECT" in the last 72 hours. Thyroid Function Tests: No results for input(s): "TSH", "T4TOTAL", "FREET4", "T3FREE", "THYROIDAB" in the last 72 hours. Anemia Panel: No results for input(s): "VITAMINB12", "FOLATE", "FERRITIN", "TIBC", "IRON", "RETICCTPCT" in the last 72 hours. Sepsis Labs: Recent Labs  Lab 08/06/2022 0941 08/08/2022 1129 07/18/2022 2005 07/20/22 0545  LATICACIDVEN 2.9* 3.0* 2.3* 1.5     Recent Results (from the past 240 hour(s))  Urine Culture     Status: Abnormal   Collection Time: 07/18/22  1:45 PM   Specimen: Urine, Random  Result Value Ref Range Status   Specimen Description   Final    URINE, RANDOM Performed at [facility], [address], [place], [place] [address]    Special Requests   Final    NONE Performed at [facility] [address] [place], [place] [address]    Culture >=100,000 COLONIES/mL KLEBSIELLA PNEUMONIAE (A)  Final   Report Status 07/20/2022 FINAL  Final   Organism ID, Bacteria KLEBSIELLA PNEUMONIAE (A)  Final      Susceptibility   Klebsiella pneumoniae - MIC*    AMPICILLIN RESISTANT Resistant     CEFAZOLIN <=4 SENSITIVE Sensitive     CEFEPIME <=0.12 SENSITIVE Sensitive     CEFTRIAXONE <=0.25 SENSITIVE Sensitive     CIPROFLOXACIN 0.5 INTERMEDIATE Intermediate     GENTAMICIN <=1 SENSITIVE Sensitive     IMIPENEM <=0.25 SENSITIVE Sensitive     NITROFURANTOIN 128 RESISTANT Resistant     TRIMETH/SULFA <=20 SENSITIVE Sensitive     AMPICILLIN/SULBACTAM 4 SENSITIVE Sensitive     PIP/TAZO 8 SENSITIVE Sensitive     * >=100,000 COLONIES/mL KLEBSIELLA PNEUMONIAE  Urine Culture     Status: None   Collection Time: 07/13/2022  9:30 AM   Specimen: Urine, Catheterized  Result Value Ref Range  Status   Specimen Description   Final    URINE, CATHETERIZED Performed at [facility], [address] [place], [place] [address]    Special Requests   Final    NONE Performed at [facility], [address] [place], [place] [address]    Culture   Final    NO GROWTH Performed at [facility], [place] [address], [place], [place] [address]    Report Status 07/20/2022 FINAL  Final  Resp panel by RT-PCR (RSV, Flu A&B, Covid) Anterior Nasal Swab     Status: None   Collection Time: 08/08/2022  9:42 AM   Specimen: Anterior Nasal Swab  Result Value Ref Range Status   SARS Coronavirus 2 by RT PCR NEGATIVE NEGATIVE Final    Comment: (NOTE) SARS-CoV-2 target nucleic acids  are NOT DETECTED.  The SARS-CoV-2 RNA is generally detectable in upper respiratory specimens during the acute phase of infection. The lowest concentration of SARS-CoV-2 viral copies this assay can detect is 138 copies/mL. A negative result does not preclude SARS-Cov-2 infection and should not be used as the sole basis for treatment or other patient management decisions. A negative result may occur with  improper specimen collection/handling, submission of specimen other than nasopharyngeal swab, presence of viral mutation(s) within the areas targeted by this assay, and inadequate number of viral copies(<138 copies/mL). A negative result must be combined with clinical observations, patient history, and epidemiological information. The expected result is Negative.  Fact Sheet for Patients:  EntrepreneurPulse.com.au  Fact Sheet for Healthcare Providers:  IncredibleEmployment.be  This test is no t yet approved or cleared by the [place] FDA and  has been authorized for detection and/or diagnosis of SARS-CoV-2 by FDA under an Emergency Use Authorization (EUA). This EUA will remain  in effect (meaning this test can be used) for the duration of the COVID-19 declaration under  Section 564(b)(1) of the Act, 21 U.S.C.section 360bbb-3(b)(1), unless the authorization is terminated  or revoked sooner.       Influenza A by PCR NEGATIVE NEGATIVE Final   Influenza B by PCR NEGATIVE NEGATIVE Final    Comment: (NOTE) The Xpert Xpress SARS-CoV-2/FLU/RSV plus assay is intended as an aid in the diagnosis of influenza from Nasopharyngeal swab specimens and should not be used as a sole basis for treatment. Nasal washings and aspirates are unacceptable for Xpert Xpress SARS-CoV-2/FLU/RSV testing.  Fact Sheet for Patients: EntrepreneurPulse.com.au  Fact Sheet for Healthcare Providers: IncredibleEmployment.be  This test is not yet approved or cleared by the [place] FDA and has been authorized for detection and/or diagnosis of SARS-CoV-2 by FDA under an Emergency Use Authorization (EUA). This EUA will remain in effect (meaning this test can be used) for the duration of the COVID-19 declaration under Section 564(b)(1) of the Act, 21 U.S.C. section 360bbb-3(b)(1), unless the authorization is terminated or revoked.     Resp Syncytial Virus by PCR NEGATIVE NEGATIVE Final    Comment: (NOTE) Fact Sheet for Patients: EntrepreneurPulse.com.au  Fact Sheet for Healthcare Providers: IncredibleEmployment.be  This test is not yet approved or cleared by the [place] FDA and has been authorized for detection and/or diagnosis of SARS-CoV-2 by FDA under an Emergency Use Authorization (EUA). This EUA will remain in effect (meaning this test can be used) for the duration of the COVID-19 declaration under Section 564(b)(1) of the Act, 21 U.S.C. section 360bbb-3(b)(1), unless the authorization is terminated or revoked.  Performed at [facility], [address] [place], [place] [address]   Culture, blood (routine x 2)     Status: None (Preliminary result)   Collection Time: 08/07/2022 11:29 AM    Specimen: Right Antecubital; Blood  Result Value Ref Range Status   Specimen Description RIGHT ANTECUBITAL  Final   Special Requests   Final    BOTTLES DRAWN AEROBIC AND ANAEROBIC Blood Culture adequate volume   Culture   Final    NO GROWTH 4 DAYS Performed at [facility], [address] [place], [place] [address]    Report Status PENDING  Incomplete  Culture, blood (routine x 2)     Status: None (Preliminary result)   Collection Time: 08/02/2022 11:29 AM   Specimen: Left Antecubital; Blood  Result Value Ref Range Status   Specimen Description LEFT ANTECUBITAL  Final   Special Requests  Final    BOTTLES DRAWN AEROBIC AND ANAEROBIC Blood Culture results may not be optimal due to an excessive volume of blood received in culture bottles   Culture   Final    NO GROWTH 4 DAYS Performed at [facility], [address], [place], [place] [address]    Report Status PENDING  Incomplete  MRSA Next Gen by PCR, Nasal     Status: None   Collection Time: 08/08/2022  5:40 PM   Specimen: Nasal Mucosa; Nasal Swab  Result Value Ref Range Status   MRSA by PCR Next Gen NOT DETECTED NOT DETECTED Final    Comment: (NOTE) The GeneXpert MRSA Assay (FDA approved for NASAL specimens only), is one component of a comprehensive MRSA colonization surveillance program. It is not intended to diagnose MRSA infection nor to guide or monitor treatment for MRSA infections. Test performance is not FDA approved in patients less than 70 years old. Performed at [facility], [place] [address], [place], [place] [address]          Radiology Studies: No results found.      Scheduled Meds:  mouth rinse  15 mL Mouth Rinse 4 times per day   scopolamine  1 patch Transdermal Q72H   Continuous Infusions:  sodium chloride Stopped (07/20/22 1704)   morphine 12 mg/hr (07/24/22 2244)          Aline August, MD [facility] 07/24/2022, 7:45 AM

## 2022-07-25 ENCOUNTER — Ambulatory Visit (HOSPITAL_COMMUNITY): Payer: Medicare Other

## 2022-07-25 MED ORDER — LORAZEPAM 2 MG/ML IJ SOLN
0.5000 mg | INTRAMUSCULAR | Status: DC | PRN
  Administered 2022-07-25: 1 mg via INTRAVENOUS
  Filled 2022-07-25 (×2): qty 1

## 2022-07-31 ENCOUNTER — Ambulatory Visit: Payer: BC Managed Care – PPO | Admitting: Hematology

## 2022-07-31 ENCOUNTER — Ambulatory Visit: Payer: BC Managed Care – PPO

## 2022-07-31 ENCOUNTER — Other Ambulatory Visit: Payer: BC Managed Care – PPO

## 2022-08-09 NOTE — Death Summary Note (Signed)
DEATH SUMMARY   Patient Details  Name: Joanna Reid MRN: [id number] DOB: April 14, 1966 EGB:TDVVO, Colin Broach, MD Admission/Discharge Information   Admit Date:  Jul 29, 2022  Date of Death: Date of Death: 08-04-22  Time of Death: Time of Death: 1000  Length of Stay: [facility] Diagnoses: Principal Problem:   Altered mental status Active Problems:   Subdural hematoma ([place])   Encounter for palliative care   Comfort measures only status   Hospital Course: 57 year old female with history of multiple myeloma, unspecified CVA, seizures, anemia thrombocytopenia presented with altered mental status. CT head revealed small left subdural hematoma along with anemia and thrombocytopenia along with hyperammonemia. CT chest/abdomen and pelvis showed increased size of multifocal lesions in the liver. She was transferred from [facility] and admitted to ICU by PCCM. Because of overall very poor prognosis, family decided to pursue comfort measures. She was started on morphine drip on 07/22/2022. She was transferred to [facility] service from 08/02/22 onwards. 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   Significant  Diagnostic Studies: CT CHEST ABDOMEN PELVIS W CONTRAST  Result Date: 29-Jul-2022 CLINICAL DATA:  History of multiple myeloma with altered mental status and concern for sepsis EXAM: CT CHEST, ABDOMEN, AND PELVIS WITH CONTRAST TECHNIQUE: Multidetector CT imaging of the chest, abdomen and pelvis was performed following the standard protocol during bolus administration of intravenous contrast. RADIATION DOSE REDUCTION: This exam was performed according to the departmental dose-optimization program which includes automated exposure control, adjustment of the mA and/or kV according to patient size and/or use of iterative reconstruction technique. CONTRAST:  14m OMNIPAQUE IOHEXOL 300 MG/ML  SOLN COMPARISON:  CTA abdomen and pelvis dated 06/23/2022, CTA chest dated 04/12/2022 FINDINGS: CT CHEST FINDINGS Moderate motion artifact. Cardiovascular: Right chest wall port terminates at the superior cavoatrial junction. Normal heart size. No significant pericardial fluid/thickening. Coronary artery calcifications. Great vessels are normal in course and caliber. No central pulmonary emboli. Mediastinum/Nodes: Imaged thyroid gland without nodules meeting criteria for imaging follow-up by size. Normal esophagus. No substantial change in size of 11 mm left internal mammary lymph node (2: 20). Lungs/Pleura: The central airways are patent. Patchy ground-glass opacities in the lingula most likely represents subsegmental atelectasis. No focal consolidations. No pneumothorax. No pleural effusion. Musculoskeletal: Heterogeneously lytic and sclerotic appearance of the axial skeleton with unchanged compression deformity of T6 and superior endplate deformity of TH60 CT ABDOMEN PELVIS FINDINGS Hepatobiliary: Severe hepatic steatosis is again seen. Interval increased size of multifocal hyperattenuating lesions throughout the liver, for example 1.8 cm segment 7 (2:40), previously 1.4 cm and 2.0 cm segment 2 (2:48), previously 1.5 cm. No  definite new lesions. no intra or extrahepatic biliary ductal dilation. Normal gallbladder. Pancreas: No focal lesions or main ductal dilation. Spleen: Enlarged, 14.5 cm, previously 13.6 cm. No focal splenic lesions. Adrenals/Urinary Tract: No adrenal nodules. No suspicious renal masses or hydronephrosis. Nonobstructing right renal stones measuring up to 5 mm in the lower  pole. Bladder is decompressed with urinary catheter in-situ. Stomach/Bowel: Normal appearance of the stomach. No evidence of bowel wall thickening, distention, or inflammatory changes. Normal appendix. Vascular/Lymphatic: Aortic atherosclerosis. No enlarged abdominal or pelvic lymph nodes. Reproductive: Status post hysterectomy. No adnexal masses. Other: No free fluid, fluid collection, or free air. Musculoskeletal: Similar heterogeneously lytic and sclerotic appearance of the axial and appendicular skeleton similar endplate deformities of L1 and 2. Similar grade 1 anterolisthesis at L4-5. IMPRESSION: 1. Interval increased size of multifocal hyperattenuating lesions throughout the liver, suspicious for worsening metastatic disease. 2. Similar heterogeneously lytic and sclerotic appearance of the axial and appendicular skeleton, likely reflecting the patient's known multiple myeloma. 3. Unchanged enlarged left internal mammary lymph node, indeterminate. 4. No acute abnormality in the chest, abdomen, or pelvis. 5. Slightly increased splenomegaly. 6. Hepatic steatosis. 7. Nonobstructing right renal stones. 8. Coronary artery calcifications. Aortic Atherosclerosis (ICD10-I70.0). Electronically Signed   By: Darrin Nipper M.D.   On: 07/10/2022 11:09   CT Head Wo Contrast  Result Date: 08/08/2022 CLINICAL DATA:  Mental status change, unknown cause Brain metastases suspected EXAM: CT HEAD WITHOUT CONTRAST TECHNIQUE: Contiguous axial images were obtained from the base of the skull through the vertex without intravenous contrast. RADIATION DOSE REDUCTION:  This exam was performed according to the departmental dose-optimization program which includes automated exposure control, adjustment of the mA and/or kV according to patient size and/or use of iterative reconstruction technique. COMPARISON:  MRI 06/18/2022. FINDINGS: Brain: Small (3 mm) acute/hyperdense subdural hemorrhage along the left frontal convexity. No significant mass effect. Similar area of encephalomalacia in the left parietal lobe. No evidence of acute large vascular territory infarct, midline shift, visible mass lesion or hydrocephalus. Vascular: No hyperdense vessel. Skull: No acute fracture. Sinuses/Orbits: Mild paranasal sinus mucosal thickening. No acute orbital findings. Other: No mastoid effusions. IMPRESSION: 1. Small (3 mm) acute/hyperdense subdural hemorrhage along the left frontal convexity. No significant mass effect. 2. If there is concern for intracranial metastatic disease, recommend MRI with contrast for further evaluation. Findings discussed with Kommor, [place], MD via telephone at 10:35 a.m. Electronically Signed   By: Margaretha Sheffield M.D.   On: 08/04/2022 10:37   Overnight EEG with video  Result Date: 07/07/2022 [name], MD     07/07/2022  6:51 AM Patient Name: Joanna Reid MRN: [id number] Epilepsy Attending: [name] Referring Physician/Provider: [name], MD Duration: 07/06/2022 1545 to 07/07/2022 1937  Patient history:  57 y.o. female with a history of seizure who is undergoing an EEG to evaluate for seizures.  Level of alertness: Awake, sleep  AEDs during EEG study: Phenobarb, PGB, LEV, LCM  Technical aspects: This EEG study was done with scalp electrodes positioned according to the 10-20 International system of electrode placement. Electrical activity was reviewed with band pass filter of 1-'70Hz'$ , sensitivity of 7 uV/mm, display speed of 75m/sec with a '60Hz'$  notched filter applied as appropriate. EEG data were recorded continuously and  digitally stored.  Video monitoring was available and reviewed as appropriate.  Description: No clear posterior dominant rhythm was seen. Sleep was characterized by sleep spindles (12 to 14 Hz), maximal frontocentral region. EEG showed continuous generalized 3 to 6 Hz theta-delta slowing. Hyperventilation and photic stimulation were not performed.   ABNORMALITY - Continuous slow, generalized  IMPRESSION: This study is suggestive of moderate to severe diffuse encephalopathy, nonspecific etiology. No seizures  were seen throughout the recording.   PLora Havens   Microbiology: Recent Results (from the past  240 hour(s))  Urine Culture     Status: Abnormal   Collection Time: 07/18/22  1:45 PM   Specimen: Urine, Random  Result Value Ref Range Status   Specimen Description   Final    URINE, RANDOM Performed at [facility], [address], [place], [place] [address]    Special Requests   Final    NONE Performed at [facility], [address], [place], [place] [address]    Culture >=100,000 COLONIES/mL KLEBSIELLA PNEUMONIAE (A)  Final   Report Status 07/20/2022 FINAL  Final   Organism ID, Bacteria KLEBSIELLA PNEUMONIAE (A)  Final      Susceptibility   Klebsiella pneumoniae - MIC*    AMPICILLIN RESISTANT Resistant     CEFAZOLIN <=4 SENSITIVE Sensitive     CEFEPIME <=0.12 SENSITIVE Sensitive     CEFTRIAXONE <=0.25 SENSITIVE Sensitive     CIPROFLOXACIN 0.5 INTERMEDIATE Intermediate     GENTAMICIN <=1 SENSITIVE Sensitive     IMIPENEM <=0.25 SENSITIVE Sensitive     NITROFURANTOIN 128 RESISTANT Resistant     TRIMETH/SULFA <=20 SENSITIVE Sensitive     AMPICILLIN/SULBACTAM 4 SENSITIVE Sensitive     PIP/TAZO 8 SENSITIVE Sensitive     * >=100,000 COLONIES/mL KLEBSIELLA PNEUMONIAE  Urine Culture     Status: None   Collection Time: 07/16/2022  9:30 AM   Specimen: Urine, Catheterized  Result Value Ref Range Status   Specimen Description   Final    URINE, CATHETERIZED Performed at [facility], [address] [place], [place] [address]    Special Requests   Final    NONE Performed at [facility], [address], [place], [place] [address]    Culture   Final    NO GROWTH Performed at [facility], [place] [address], [place], [place] [address]    Report Status 07/20/2022 FINAL  Final  Resp panel by RT-PCR (RSV, Flu A&B, Covid) Anterior Nasal Swab     Status: None   Collection Time: 07/22/2022  9:42 AM   Specimen: Anterior Nasal Swab  Result Value Ref Range Status   SARS Coronavirus 2 by RT PCR NEGATIVE NEGATIVE Final    Comment: (NOTE) SARS-CoV-2 target nucleic acids are NOT DETECTED.  The SARS-CoV-2 RNA is generally detectable in upper respiratory specimens during the acute phase of infection. The lowest concentration of SARS-CoV-2 viral copies this assay can detect is 138 copies/mL. A negative result does not preclude SARS-Cov-2 infection and should not be used as the sole basis for treatment or other patient management decisions. A negative result may occur with  improper specimen collection/handling, submission of specimen other than nasopharyngeal swab, presence of viral mutation(s) within the areas targeted by this assay, and inadequate number of viral copies(<138 copies/mL). A negative result must be combined with clinical observations, patient history, and epidemiological information. The expected result is Negative.  Fact Sheet for Patients:  EntrepreneurPulse.com.au  Fact Sheet for Healthcare Providers:  IncredibleEmployment.be  This test is no t yet approved or cleared by the [place] FDA and  has been authorized for detection and/or diagnosis of SARS-CoV-2 by FDA under an Emergency Use Authorization (EUA). This EUA will remain  in effect (meaning this test can be used) for the duration of the COVID-19 declaration under Section 564(b)(1) of the Act, 21 U.S.C.section 360bbb-3(b)(1), unless the authorization is  terminated  or revoked sooner.       Influenza A by PCR NEGATIVE NEGATIVE Final   Influenza B by PCR NEGATIVE NEGATIVE  Final    Comment: (NOTE) The Xpert Xpress SARS-CoV-2/FLU/RSV plus assay is intended as an aid in the diagnosis of influenza from Nasopharyngeal swab specimens and should not be used as a sole basis for treatment. Nasal washings and aspirates are unacceptable for Xpert Xpress SARS-CoV-2/FLU/RSV testing.  Fact Sheet for Patients: EntrepreneurPulse.com.au  Fact Sheet for Healthcare Providers: IncredibleEmployment.be  This test is not yet approved or cleared by the [place] FDA and has been authorized for detection and/or diagnosis of SARS-CoV-2 by FDA under an Emergency Use Authorization (EUA). This EUA will remain in effect (meaning this test can be used) for the duration of the COVID-19 declaration under Section 564(b)(1) of the Act, 21 U.S.C. section 360bbb-3(b)(1), unless the authorization is terminated or revoked.     Resp Syncytial Virus by PCR NEGATIVE NEGATIVE Final    Comment: (NOTE) Fact Sheet for Patients: EntrepreneurPulse.com.au  Fact Sheet for Healthcare Providers: IncredibleEmployment.be  This test is not yet approved or cleared by the [place] FDA and has been authorized for detection and/or diagnosis of SARS-CoV-2 by FDA under an Emergency Use Authorization (EUA). This EUA will remain in effect (meaning this test can be used) for the duration of the COVID-19 declaration under Section 564(b)(1) of the Act, 21 U.S.C. section 360bbb-3(b)(1), unless the authorization is terminated or revoked.  Performed at [facility], [address], [place], [place] [address]   Culture, blood (routine x 2)     Status: None   Collection Time: 08/01/2022 11:29 AM   Specimen: Right Antecubital; Blood  Result Value Ref Range Status   Specimen Description RIGHT ANTECUBITAL  Final    Special Requests   Final    BOTTLES DRAWN AEROBIC AND ANAEROBIC Blood Culture adequate volume   Culture   Final    NO GROWTH 5 DAYS Performed at [facility], [address], [place], [place] [address]    Report Status 07/24/2022 FINAL  Final  Culture, blood (routine x 2)     Status: None   Collection Time: 07/13/2022 11:29 AM   Specimen: Left Antecubital; Blood  Result Value Ref Range Status   Specimen Description LEFT ANTECUBITAL  Final   Special Requests   Final    BOTTLES DRAWN AEROBIC AND ANAEROBIC Blood Culture results may not be optimal due to an excessive volume of blood received in culture bottles   Culture   Final    NO GROWTH 5 DAYS Performed at [facility], [address] [place], [place] [address]    Report Status 07/24/2022 FINAL  Final  MRSA Next Gen by PCR, Nasal     Status: None   Collection Time: 08/07/2022  5:40 PM   Specimen: Nasal Mucosa; Nasal Swab  Result Value Ref Range Status   MRSA by PCR Next Gen NOT DETECTED NOT DETECTED Final    Comment: (NOTE) The GeneXpert MRSA Assay (FDA approved for NASAL specimens only), is one component of a comprehensive MRSA colonization surveillance program. It is not intended to diagnose MRSA infection nor to guide or monitor treatment for MRSA infections. Test performance is not FDA approved in patients less than 23 years old. Performed at [facility], [place] [address] [place], [place] [address]     Time spent: 10 minutes  Signed: Marzetta Board, MD August 13, 2022

## 2022-08-09 DEATH — deceased

## 2022-08-10 ENCOUNTER — Ambulatory Visit: Payer: BC Managed Care – PPO | Admitting: Internal Medicine
